# Patient Record
Sex: Male | Born: 1955
Health system: Southern US, Community
[De-identification: ages and names within clinical notes are randomized; demographics above are authoritative.]

## PROBLEM LIST (undated history)

## (undated) DIAGNOSIS — N183 Chronic kidney disease, stage 3 unspecified: Secondary | ICD-10-CM

## (undated) DIAGNOSIS — J449 Chronic obstructive pulmonary disease, unspecified: Secondary | ICD-10-CM

## (undated) DIAGNOSIS — I10 Essential (primary) hypertension: Secondary | ICD-10-CM

## (undated) DIAGNOSIS — D509 Iron deficiency anemia, unspecified: Secondary | ICD-10-CM

## (undated) DIAGNOSIS — R339 Retention of urine, unspecified: Secondary | ICD-10-CM

## (undated) DIAGNOSIS — C760 Malignant neoplasm of head, face and neck: Secondary | ICD-10-CM

## (undated) DIAGNOSIS — I739 Peripheral vascular disease, unspecified: Secondary | ICD-10-CM

## (undated) DIAGNOSIS — I779 Disorder of arteries and arterioles, unspecified: Secondary | ICD-10-CM

## (undated) DIAGNOSIS — I2510 Atherosclerotic heart disease of native coronary artery without angina pectoris: Secondary | ICD-10-CM

## (undated) DIAGNOSIS — I5032 Chronic diastolic (congestive) heart failure: Secondary | ICD-10-CM

## (undated) DIAGNOSIS — I429 Cardiomyopathy, unspecified: Secondary | ICD-10-CM

## (undated) DIAGNOSIS — I499 Cardiac arrhythmia, unspecified: Secondary | ICD-10-CM

## (undated) DIAGNOSIS — I4891 Unspecified atrial fibrillation: Secondary | ICD-10-CM

## (undated) HISTORY — DX: Essential (primary) hypertension: I10

## (undated) HISTORY — DX: Malignant neoplasm of head, face and neck: C76.0

## (undated) HISTORY — DX: Cardiomyopathy, unspecified: I42.9

## (undated) HISTORY — DX: Disorder of arteries and arterioles, unspecified: I77.9

## (undated) HISTORY — DX: Chronic kidney disease, stage 3 (moderate): N18.3

## (undated) HISTORY — DX: Retention of urine, unspecified: R33.9

## (undated) HISTORY — DX: Chronic diastolic (congestive) heart failure: I50.32

## (undated) HISTORY — PX: TONSILLECTOMY: SUR1361

## (undated) HISTORY — DX: Peripheral vascular disease, unspecified: I73.9

## (undated) HISTORY — DX: Chronic kidney disease, stage 3 unspecified: N18.30

## (undated) HISTORY — PX: ANKLE CLOSED REDUCTION: SHX880

---

## 2011-04-23 ENCOUNTER — Emergency Department: Payer: Self-pay | Admitting: Emergency Medicine

## 2011-04-23 LAB — COMPREHENSIVE METABOLIC PANEL
Albumin: 3.2 g/dL — ABNORMAL LOW (ref 3.4–5.0)
Alkaline Phosphatase: 62 U/L (ref 50–136)
Anion Gap: 12 (ref 7–16)
BUN: 17 mg/dL (ref 7–18)
Bilirubin,Total: 1 mg/dL (ref 0.2–1.0)
Chloride: 100 mmol/L (ref 98–107)
Co2: 28 mmol/L (ref 21–32)
Creatinine: 1.18 mg/dL (ref 0.60–1.30)
EGFR (African American): >60
Glucose: 101 mg/dL — ABNORMAL HIGH (ref 65–99)
SGOT(AST): 64 U/L — ABNORMAL HIGH (ref 15–37)
SGPT (ALT): 70 U/L
Total Protein: 7.3 g/dL (ref 6.4–8.2)

## 2011-04-23 LAB — DIFFERENTIAL
Basophil #: 0 10*3/uL (ref 0.0–0.1)
Basophil %: 0.3 %
Eosinophil %: 0.1 %
Lymphocyte #: 1.1 10*3/uL (ref 1.0–3.6)
Lymphocyte %: 7.7 %
Monocyte %: 6.3 %
Neutrophil #: 12.4 10*3/uL — ABNORMAL HIGH (ref 1.4–6.5)

## 2011-04-23 LAB — URINALYSIS, COMPLETE
Bilirubin,UR: NEGATIVE
Ketone: NEGATIVE
Nitrite: NEGATIVE
Protein: 30
RBC,UR: 8 /HPF (ref 0–5)
WBC UR: 6 /HPF (ref 0–5)

## 2011-04-23 LAB — CBC
HCT: 41.6 % (ref 40.0–52.0)
HGB: 13.8 g/dL (ref 13.0–18.0)
MCH: 28 pg (ref 26.0–34.0)
MCV: 84 fL (ref 80–100)
RBC: 4.93 10*6/uL (ref 4.40–5.90)
RDW: 12.3 % (ref 11.5–14.5)

## 2017-01-31 DIAGNOSIS — C760 Malignant neoplasm of head, face and neck: Secondary | ICD-10-CM

## 2017-01-31 HISTORY — PX: BASAL CELL CARCINOMA EXCISION: SHX1214

## 2017-01-31 HISTORY — DX: Malignant neoplasm of head, face and neck: C76.0

## 2017-04-01 ENCOUNTER — Inpatient Hospital Stay (HOSPITAL_COMMUNITY)
Admission: EM | Admit: 2017-04-01 | Discharge: 2017-04-06 | DRG: 291 | Disposition: A | Discharge status: Home / Self Care | Payer: Medicaid Other | Attending: Internal Medicine | Admitting: Internal Medicine

## 2017-04-01 ENCOUNTER — Other Ambulatory Visit: Payer: Self-pay

## 2017-04-01 ENCOUNTER — Encounter (HOSPITAL_COMMUNITY): Payer: Self-pay | Admitting: *Deleted

## 2017-04-01 DIAGNOSIS — I248 Other forms of acute ischemic heart disease: Secondary | ICD-10-CM | POA: Diagnosis present

## 2017-04-01 DIAGNOSIS — I6521 Occlusion and stenosis of right carotid artery: Secondary | ICD-10-CM | POA: Diagnosis present

## 2017-04-01 DIAGNOSIS — J3489 Other specified disorders of nose and nasal sinuses: Secondary | ICD-10-CM

## 2017-04-01 DIAGNOSIS — K047 Periapical abscess without sinus: Secondary | ICD-10-CM | POA: Diagnosis present

## 2017-04-01 DIAGNOSIS — I471 Supraventricular tachycardia: Secondary | ICD-10-CM | POA: Diagnosis present

## 2017-04-01 DIAGNOSIS — E785 Hyperlipidemia, unspecified: Secondary | ICD-10-CM | POA: Diagnosis present

## 2017-04-01 DIAGNOSIS — D75839 Thrombocytosis, unspecified: Secondary | ICD-10-CM

## 2017-04-01 DIAGNOSIS — E872 Acidosis, unspecified: Secondary | ICD-10-CM

## 2017-04-01 DIAGNOSIS — I5032 Chronic diastolic (congestive) heart failure: Secondary | ICD-10-CM

## 2017-04-01 DIAGNOSIS — D473 Essential (hemorrhagic) thrombocythemia: Secondary | ICD-10-CM

## 2017-04-01 DIAGNOSIS — Z720 Tobacco use: Secondary | ICD-10-CM

## 2017-04-01 DIAGNOSIS — C44319 Basal cell carcinoma of skin of other parts of face: Secondary | ICD-10-CM | POA: Diagnosis present

## 2017-04-01 DIAGNOSIS — I509 Heart failure, unspecified: Secondary | ICD-10-CM

## 2017-04-01 DIAGNOSIS — J349 Unspecified disorder of nose and nasal sinuses: Secondary | ICD-10-CM | POA: Diagnosis present

## 2017-04-01 DIAGNOSIS — K029 Dental caries, unspecified: Secondary | ICD-10-CM | POA: Diagnosis present

## 2017-04-01 DIAGNOSIS — D638 Anemia in other chronic diseases classified elsewhere: Secondary | ICD-10-CM

## 2017-04-01 DIAGNOSIS — F17200 Nicotine dependence, unspecified, uncomplicated: Secondary | ICD-10-CM

## 2017-04-01 DIAGNOSIS — I6529 Occlusion and stenosis of unspecified carotid artery: Secondary | ICD-10-CM

## 2017-04-01 DIAGNOSIS — R918 Other nonspecific abnormal finding of lung field: Secondary | ICD-10-CM | POA: Diagnosis present

## 2017-04-01 DIAGNOSIS — T501X5A Adverse effect of loop [high-ceiling] diuretics, initial encounter: Secondary | ICD-10-CM | POA: Diagnosis present

## 2017-04-01 DIAGNOSIS — I5022 Chronic systolic (congestive) heart failure: Secondary | ICD-10-CM

## 2017-04-01 DIAGNOSIS — L989 Disorder of the skin and subcutaneous tissue, unspecified: Secondary | ICD-10-CM

## 2017-04-01 DIAGNOSIS — R739 Hyperglycemia, unspecified: Secondary | ICD-10-CM | POA: Diagnosis present

## 2017-04-01 DIAGNOSIS — I96 Gangrene, not elsewhere classified: Secondary | ICD-10-CM | POA: Diagnosis present

## 2017-04-01 DIAGNOSIS — R778 Other specified abnormalities of plasma proteins: Secondary | ICD-10-CM

## 2017-04-01 DIAGNOSIS — I130 Hypertensive heart and chronic kidney disease with heart failure and stage 1 through stage 4 chronic kidney disease, or unspecified chronic kidney disease: Principal | ICD-10-CM | POA: Diagnosis present

## 2017-04-01 DIAGNOSIS — D649 Anemia, unspecified: Secondary | ICD-10-CM

## 2017-04-01 DIAGNOSIS — I10 Essential (primary) hypertension: Secondary | ICD-10-CM

## 2017-04-01 DIAGNOSIS — R7989 Other specified abnormal findings of blood chemistry: Secondary | ICD-10-CM

## 2017-04-01 DIAGNOSIS — D509 Iron deficiency anemia, unspecified: Secondary | ICD-10-CM | POA: Diagnosis present

## 2017-04-01 DIAGNOSIS — J9601 Acute respiratory failure with hypoxia: Secondary | ICD-10-CM | POA: Diagnosis present

## 2017-04-01 DIAGNOSIS — N179 Acute kidney failure, unspecified: Secondary | ICD-10-CM | POA: Diagnosis present

## 2017-04-01 DIAGNOSIS — I5031 Acute diastolic (congestive) heart failure: Secondary | ICD-10-CM | POA: Diagnosis present

## 2017-04-01 DIAGNOSIS — Z823 Family history of stroke: Secondary | ICD-10-CM

## 2017-04-01 DIAGNOSIS — N182 Chronic kidney disease, stage 2 (mild): Secondary | ICD-10-CM | POA: Diagnosis present

## 2017-04-01 DIAGNOSIS — F17210 Nicotine dependence, cigarettes, uncomplicated: Secondary | ICD-10-CM | POA: Diagnosis present

## 2017-04-01 DIAGNOSIS — E876 Hypokalemia: Secondary | ICD-10-CM | POA: Diagnosis present

## 2017-04-01 NOTE — ED Triage Notes (Signed)
[facility] reports pt reports sudden onset of sob around 2 hrs ago.

## 2017-04-01 NOTE — ED Provider Notes (Signed)
[facility] EMERGENCY DEPARTMENT Provider Note   CSN: [id number] Arrival date & time: 04/01/17  2315     History   Chief Complaint Chief Complaint  Patient presents with  . Shortness of Breath    HPI Michael [name] is a 62 y.o. male.  Patient brought from home by EMS for shortness of breath.  He reports that symptoms began approximately 2 hours ago.  Patient denies any previous medical history including heart problems and lung problems.  First responders report that the patient was very tachypneic and his room air oxygen saturation was in the 60% range.  He was placed on oxygen with improvement.  EMS report that they found him to be very diminished with slight wheezing.  He has been given multiple nebs with some improvement, but still requiring submental oxygen to maintain his saturations.  Patient denies any chest pain.  He denies any fevers, recent upper respiratory infection symptoms such as cough and congestion.      History reviewed. No pertinent past medical history.  There are no active problems to display for this patient.   History reviewed. No pertinent surgical history.     Home Medications    Prior to Admission medications   Not on File    Family History No family history on file.  Social History Social History   Tobacco Use  . Smoking status: Current Some Day Smoker    Packs/day: 0.50  . Smokeless tobacco: Never Used  Substance Use Topics  . Alcohol use: Not on file  . Drug use: Not on file     Allergies   Patient has no known allergies.   Review of Systems Review of Systems  Respiratory: Positive for shortness of breath.   All other systems reviewed and are negative.    Physical Exam Updated Vital Signs BP (!) 149/106   Pulse 97   Temp 98.3 F (36.8 C) (Oral)   Resp 19   SpO2 91%   Physical Exam  Constitutional: He is oriented to person, place, and time. He appears well-developed and well-nourished. No distress.  HENT:  Head:  Normocephalic and atraumatic.  Right Ear: Hearing normal.  Left Ear: Hearing normal.  Nose: Nose normal.  Mouth/Throat: Oropharynx is clear and moist and mucous membranes are normal.  Deeply ulcerated area at right nasolabial fold and right nare  Eyes: Conjunctivae and EOM are normal. Pupils are equal, round, and reactive to light.  Neck: Normal range of motion. Neck supple.  Cardiovascular: Regular rhythm, S1 normal and S2 normal. Exam reveals no gallop and no friction rub.  No murmur heard. Pulmonary/Chest: Effort normal. Tachypnea noted. He has decreased breath sounds. He has rhonchi. He has rales. He exhibits no tenderness.  Abdominal: Soft. Normal appearance and bowel sounds are normal. There is no hepatosplenomegaly. There is no tenderness. There is no rebound, no guarding, no tenderness at McBurney's point and negative Murphy's sign. No hernia.  Musculoskeletal: Normal range of motion.  Neurological: He is alert and oriented to person, place, and time. He has normal strength. No cranial nerve deficit or sensory deficit. Coordination normal. GCS eye subscore is 4. GCS verbal subscore is 5. GCS motor subscore is 6.  Skin: Skin is warm, dry and intact. No rash noted. No cyanosis.  Psychiatric: He has a normal mood and affect. His speech is normal and behavior is normal. Thought content normal.  Nursing note and vitals reviewed.    ED Treatments / Results  Labs (all labs ordered are  listed, but only abnormal results are displayed) Labs Reviewed  CBC WITH DIFFERENTIAL/PLATELET - Abnormal; Notable for the following components:      Result Value   WBC 17.4 (*)    Hemoglobin 9.1 (*)    HCT 31.1 (*)    MCV 71.8 (*)    MCH 21.0 (*)    MCHC 29.3 (*)    RDW 16.8 (*)    Neutro Abs 15.0 (*)    All other components within normal limits  COMPREHENSIVE METABOLIC PANEL - Abnormal; Notable for the following components:   Potassium 3.4 (*)    Glucose, Bld 154 (*)    BUN 21 (*)     Creatinine, Ser 1.35 (*)    Calcium 8.6 (*)    Albumin 3.3 (*)    ALT 15 (*)    GFR calc non Af Amer 55 (*)    All other components within normal limits  BRAIN NATRIURETIC PEPTIDE - Abnormal; Notable for the following components:   B Natriuretic Peptide 812.0 (*)    All other components within normal limits  D-DIMER, QUANTITATIVE (NOT AT [facility]) - Abnormal; Notable for the following components:   D-Dimer, Quant 5.20 (*)    All other components within normal limits  I-STAT CG4 LACTIC ACID, ED - Abnormal; Notable for the following components:   Lactic Acid, Venous 3.21 (*)    All other components within normal limits  I-STAT TROPONIN, ED - Abnormal; Notable for the following components:   Troponin i, poc 0.12 (*)    All other components within normal limits  PROTIME-INR  BLOOD GAS, ARTERIAL  URINALYSIS, ROUTINE W REFLEX MICROSCOPIC    EKG  EKG Interpretation  Date/Time:  Sunday April 02 2017 00:05:34 EST Ventricular Rate:  104 PR Interval:    QRS Duration: 118 QT Interval:  412 QTC Calculation: 542 R Axis:   62 Text Interpretation:  Ectopic atrial tachycardia, unifocal or sinus tachycardia Multiple ventricular premature complexes Nonspecific intraventricular conduction delay Anteroseptal infarct, old Nonspecific repol abnormality, lateral leads Confirmed by Orpah Greek [phone]) on 04/02/2017 12:09:18 AM       Radiology Dg Chest 2 View  Result Date: 04/02/2017 CLINICAL DATA:  Acute onset shortness of breath 2 hours ago. EXAM: CHEST  2 VIEW COMPARISON:  None. FINDINGS: The chest is hyperexpanded with distortion of the pulmonary architecture. Heart size is upper normal. No consolidative process, edema, pneumothorax or effusion. Aortic atherosclerosis is noted. No acute bony abnormality. IMPRESSION: No acute disease. Appearance of the chest compatible with COPD. Atherosclerosis. Electronically Signed   By: Inge Rise M.D.   On: 04/02/2017 00:31   Ct Angio Chest Pe W Or  Wo Contrast  Result Date: 04/02/2017 CLINICAL DATA:  Shortness of breath.  Elevated D-dimer.  Smoker. EXAM: CT ANGIOGRAPHY CHEST WITH CONTRAST TECHNIQUE: Multidetector CT imaging of the chest was performed using the standard protocol during bolus administration of intravenous contrast. Multiplanar CT image reconstructions and MIPs were obtained to evaluate the vascular anatomy. CONTRAST:  1105mL ISOVUE-370 IOPAMIDOL (ISOVUE-370) INJECTION 76% COMPARISON:  Radiographs earlier this day. FINDINGS: Cardiovascular: There are no filling defects within the pulmonary arteries to suggest pulmonary embolus. Moderate calcified and noncalcified atheromatous plaque of the thoracic aorta without dissection, aneurysm, or acute aortic abnormality. Atheromatous plaque involves the origin of the great vessels from the aortic arch with mild luminal narrowing of the left common carotid artery origin. Mild cardiomegaly. There are coronary artery calcifications. Small pericardial effusion. Mediastinum/Nodes: Prominent paratracheal and prevascular nodes, largest measuring  11 mm short axis. Prominent left greater than right hilar nodes, all subcentimeter in short axis. Esophagus is nondistended. No visualized thyroid nodule. Lungs/Pleura: Small bilateral pleural effusions. Smooth septal thickening in the lower lobes. Patchy mild perihilar ground-glass opacities, slight lower lobe predominance. There is a central bronchial thickening. Mild fissural thickening, greater on the left. Small right upper lobe calcified nodule image 27 series 6. There is a 4 mm noncalcified right upper lobe nodule image 37. 4 mm subpleural left upper lobe nodule image 50. Mild dependent atelectasis in both lower lobes. Upper Abdomen: 10 mm cyst in the liver. Adjacent small hypodensity is too small to characterize. Atherosclerosis of upper abdominal vasculature. Musculoskeletal: Vertebral body hemangioma within T2 with minimal extension into the right pedicle.  Remote right posterior eleventh rib fracture. Review of the MIP images confirms the above findings. IMPRESSION: 1. No pulmonary embolus. 2. Cardiomegaly with small pleural effusions and mild pulmonary edema, suggesting mild CHF. 3. Mild central bronchial thickening is nonspecific and may be bronchial inflammation or secondary to pulmonary edema. 4. Small right and left upper lobe pulmonary nodules, each measuring 4 mm. No follow-up needed if patient is low-risk (and has no known or suspected primary neoplasm). Non-contrast chest CT can be considered in 12 months if patient is high-risk. This recommendation follows the consensus statement: Guidelines for Management of Incidental Pulmonary Nodules Detected on CT Images: From the Fleischner Society 2017; Radiology 2017; 284:228-243. 5. Moderate Aortic Atherosclerosis (ICD10-I70.0). Coronary artery calcifications. Electronically Signed   By: Jeb Levering M.D.   On: 04/02/2017 02:13    Procedures Procedures (including critical care time)  Medications Ordered in ED Medications  furosemide (LASIX) injection 40 mg (not administered)  iopamidol (ISOVUE-370) 76 % injection 100 mL (100 mLs Intravenous Contrast Given 04/02/17 0149)     Initial Impression / Assessment and Plan / ED Course  I have reviewed the triage vital signs and the nursing notes.  Pertinent labs & imaging results that were available during my care of the patient were reviewed by me and considered in my medical decision making (see chart for details).     Patient presents to the emergency department for evaluation of shortness of breath.  Symptoms began several hours ago.  Patient is in mild respiratory distress on arrival, requiring supplemental oxygen.  He reports no previous history.  EMS report that he had some wheezing during transport, administered DuoNeb.  Arrival to the ER no wheezing is noted.  He is tachypneic with reasonably good air movement but some rales and possible  rhonchi noted.  Patient does have elevated BNP.  His d-dimer was markedly elevated as well.  He underwent CT angiography to evaluate for potential pulmonary embolism.  There is no evidence of pulmonary embolism, he does have findings consistent with congestive heart failure.  Patient's troponin is slightly elevated, likely a troponin leak secondary to acute CHF.  He is not experiencing any chest pain.  Administered IV Lasix.  He is maintaining O2 sats currently on nasal cannula O2.  He will require hospitalization for further management and workup.  Final Clinical Impressions(s) / ED Diagnoses   Final diagnoses:  Acute congestive heart failure, unspecified heart failure type [facility])    ED Discharge Orders    None       Orpah Greek, MD 04/02/17 [phone]
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
## 2017-04-02 ENCOUNTER — Observation Stay (HOSPITAL_COMMUNITY): Payer: Medicaid Other

## 2017-04-02 ENCOUNTER — Other Ambulatory Visit: Payer: Self-pay

## 2017-04-02 ENCOUNTER — Emergency Department (HOSPITAL_COMMUNITY): Payer: Medicaid Other

## 2017-04-02 ENCOUNTER — Encounter (HOSPITAL_COMMUNITY): Payer: Self-pay

## 2017-04-02 ENCOUNTER — Observation Stay (HOSPITAL_BASED_OUTPATIENT_CLINIC_OR_DEPARTMENT_OTHER): Payer: Medicaid Other

## 2017-04-02 DIAGNOSIS — L989 Disorder of the skin and subcutaneous tissue, unspecified: Secondary | ICD-10-CM

## 2017-04-02 DIAGNOSIS — I5032 Chronic diastolic (congestive) heart failure: Secondary | ICD-10-CM

## 2017-04-02 DIAGNOSIS — R778 Other specified abnormalities of plasma proteins: Secondary | ICD-10-CM

## 2017-04-02 DIAGNOSIS — D75839 Thrombocytosis, unspecified: Secondary | ICD-10-CM

## 2017-04-02 DIAGNOSIS — R748 Abnormal levels of other serum enzymes: Secondary | ICD-10-CM | POA: Diagnosis not present

## 2017-04-02 DIAGNOSIS — I340 Nonrheumatic mitral (valve) insufficiency: Secondary | ICD-10-CM

## 2017-04-02 DIAGNOSIS — D473 Essential (hemorrhagic) thrombocythemia: Secondary | ICD-10-CM

## 2017-04-02 DIAGNOSIS — I5030 Unspecified diastolic (congestive) heart failure: Secondary | ICD-10-CM

## 2017-04-02 DIAGNOSIS — E876 Hypokalemia: Secondary | ICD-10-CM

## 2017-04-02 DIAGNOSIS — I509 Heart failure, unspecified: Secondary | ICD-10-CM

## 2017-04-02 DIAGNOSIS — D638 Anemia in other chronic diseases classified elsewhere: Secondary | ICD-10-CM

## 2017-04-02 DIAGNOSIS — I5022 Chronic systolic (congestive) heart failure: Secondary | ICD-10-CM

## 2017-04-02 DIAGNOSIS — D649 Anemia, unspecified: Secondary | ICD-10-CM

## 2017-04-02 DIAGNOSIS — Z720 Tobacco use: Secondary | ICD-10-CM

## 2017-04-02 DIAGNOSIS — E872 Acidosis, unspecified: Secondary | ICD-10-CM

## 2017-04-02 DIAGNOSIS — J3489 Other specified disorders of nose and nasal sinuses: Secondary | ICD-10-CM

## 2017-04-02 DIAGNOSIS — I10 Essential (primary) hypertension: Secondary | ICD-10-CM | POA: Diagnosis not present

## 2017-04-02 DIAGNOSIS — F17200 Nicotine dependence, unspecified, uncomplicated: Secondary | ICD-10-CM

## 2017-04-02 DIAGNOSIS — R7989 Other specified abnormal findings of blood chemistry: Secondary | ICD-10-CM

## 2017-04-02 DIAGNOSIS — J9601 Acute respiratory failure with hypoxia: Secondary | ICD-10-CM

## 2017-04-02 LAB — CBC WITH DIFFERENTIAL/PLATELET
BASOS ABS: 0 10*3/uL (ref 0.0–0.1)
BASOS PCT: 0 %
EOS ABS: 0.1 10*3/uL (ref 0.0–0.7)
EOS PCT: 1 %
HCT: 31.1 % — ABNORMAL LOW (ref 39.0–52.0)
Hemoglobin: 9.1 g/dL — ABNORMAL LOW (ref 13.0–17.0)
Lymphocytes Relative: 8 %
Lymphs Abs: 1.4 10*3/uL (ref 0.7–4.0)
MCH: 21 pg — ABNORMAL LOW (ref 26.0–34.0)
MCHC: 29.3 g/dL — ABNORMAL LOW (ref 30.0–36.0)
MCV: 71.8 fL — AB (ref 78.0–100.0)
MONO ABS: 0.8 10*3/uL (ref 0.1–1.0)
Monocytes Relative: 5 %
Neutro Abs: 15 10*3/uL — ABNORMAL HIGH (ref 1.7–7.7)
Neutrophils Relative %: 86 %
PLATELETS: 360 10*3/uL (ref 150–400)
RBC: 4.33 MIL/uL (ref 4.22–5.81)
RDW: 16.8 % — AB (ref 11.5–15.5)
WBC: 17.4 10*3/uL — AB (ref 4.0–10.5)

## 2017-04-02 LAB — URINALYSIS, COMPLETE (UACMP) WITH MICROSCOPIC
Bilirubin Urine: NEGATIVE
Glucose, UA: NEGATIVE mg/dL
Ketones, ur: NEGATIVE mg/dL
Nitrite: POSITIVE — AB
PROTEIN: NEGATIVE mg/dL
SPECIFIC GRAVITY, URINE: 1.012 (ref 1.005–1.030)
pH: 8 (ref 5.0–8.0)

## 2017-04-02 LAB — BASIC METABOLIC PANEL
Anion gap: 15 (ref 5–15)
BUN: 21 mg/dL — AB (ref 6–20)
CO2: 22 mmol/L (ref 22–32)
Calcium: 9 mg/dL (ref 8.9–10.3)
Chloride: 103 mmol/L (ref 101–111)
Creatinine, Ser: 1.43 mg/dL — ABNORMAL HIGH (ref 0.61–1.24)
GFR calc Af Amer: 59 mL/min — ABNORMAL LOW (ref >60)
GFR, EST NON AFRICAN AMERICAN: 51 mL/min — AB (ref >60)
GLUCOSE: 200 mg/dL — AB (ref 65–99)
Potassium: 3.3 mmol/L — ABNORMAL LOW (ref 3.5–5.1)
SODIUM: 140 mmol/L (ref 135–145)

## 2017-04-02 LAB — LIPID PANEL
CHOL/HDL RATIO: 4.6 ratio
Cholesterol: 194 mg/dL (ref 0–200)
HDL: 42 mg/dL (ref >40)
LDL CALC: 142 mg/dL — AB (ref 0–99)
TRIGLYCERIDES: 49 mg/dL (ref <150)
VLDL: 10 mg/dL (ref 0–40)

## 2017-04-02 LAB — RETICULOCYTES
RBC.: 4.49 MIL/uL (ref 4.22–5.81)
RETIC COUNT ABSOLUTE: 44.9 10*3/uL (ref 19.0–186.0)
RETIC CT PCT: 1 % (ref 0.4–3.1)

## 2017-04-02 LAB — BLOOD GAS, ARTERIAL
Acid-Base Excess: 0.5 mmol/L (ref 0.0–2.0)
Bicarbonate: 24.2 mmol/L (ref 20.0–28.0)
Drawn by: 22223
O2 CONTENT: 6 L/min
O2 Saturation: 97.3 %
pCO2 arterial: 35.1 mmHg (ref 32.0–48.0)
pH, Arterial: 7.436 (ref 7.350–7.450)
pO2, Arterial: 101 mmHg (ref 83.0–108.0)

## 2017-04-02 LAB — COMPREHENSIVE METABOLIC PANEL
ALBUMIN: 3.3 g/dL — AB (ref 3.5–5.0)
ALT: 15 U/L — AB (ref 17–63)
AST: 22 U/L (ref 15–41)
Alkaline Phosphatase: 93 U/L (ref 38–126)
Anion gap: 12 (ref 5–15)
BUN: 21 mg/dL — ABNORMAL HIGH (ref 6–20)
CALCIUM: 8.6 mg/dL — AB (ref 8.9–10.3)
CO2: 24 mmol/L (ref 22–32)
CREATININE: 1.35 mg/dL — AB (ref 0.61–1.24)
Chloride: 107 mmol/L (ref 101–111)
GFR calc Af Amer: >60 mL/min (ref >60)
GFR, EST NON AFRICAN AMERICAN: 55 mL/min — AB (ref >60)
GLUCOSE: 154 mg/dL — AB (ref 65–99)
POTASSIUM: 3.4 mmol/L — AB (ref 3.5–5.1)
SODIUM: 143 mmol/L (ref 135–145)
TOTAL PROTEIN: 7.2 g/dL (ref 6.5–8.1)
Total Bilirubin: 0.4 mg/dL (ref 0.3–1.2)

## 2017-04-02 LAB — VITAMIN B12: Vitamin B-12: 163 pg/mL — ABNORMAL LOW (ref 180–914)

## 2017-04-02 LAB — PROTIME-INR
INR: 1.08
Prothrombin Time: 13.9 seconds (ref 11.4–15.2)

## 2017-04-02 LAB — ECHOCARDIOGRAM COMPLETE
HEIGHTINCHES: 72 in
Weight: 3072 oz

## 2017-04-02 LAB — CBC
HCT: 31.8 % — ABNORMAL LOW (ref 39.0–52.0)
HEMOGLOBIN: 9.2 g/dL — AB (ref 13.0–17.0)
MCH: 20.5 pg — AB (ref 26.0–34.0)
MCHC: 28.9 g/dL — ABNORMAL LOW (ref 30.0–36.0)
MCV: 70.8 fL — ABNORMAL LOW (ref 78.0–100.0)
PLATELETS: 417 10*3/uL — AB (ref 150–400)
RBC: 4.49 MIL/uL (ref 4.22–5.81)
RDW: 16.8 % — ABNORMAL HIGH (ref 11.5–15.5)
WBC: 10.8 10*3/uL — AB (ref 4.0–10.5)

## 2017-04-02 LAB — URINALYSIS, ROUTINE W REFLEX MICROSCOPIC
Bacteria, UA: NONE SEEN
Bilirubin Urine: NEGATIVE
Glucose, UA: NEGATIVE mg/dL
Ketones, ur: NEGATIVE mg/dL
Nitrite: POSITIVE — AB
PH: 8 (ref 5.0–8.0)
Protein, ur: 100 mg/dL — AB
SPECIFIC GRAVITY, URINE: 1.019 (ref 1.005–1.030)

## 2017-04-02 LAB — TROPONIN I
TROPONIN I: 0.85 ng/mL — AB (ref <0.03)
Troponin I: 0.83 ng/mL (ref <0.03)
Troponin I: 0.78 ng/mL (ref <0.03)

## 2017-04-02 LAB — GLUCOSE, CAPILLARY
GLUCOSE-CAPILLARY: 191 mg/dL — AB (ref 65–99)
GLUCOSE-CAPILLARY: 124 mg/dL — AB (ref 65–99)
GLUCOSE-CAPILLARY: 135 mg/dL — AB (ref 65–99)
Glucose-Capillary: 118 mg/dL — ABNORMAL HIGH (ref 65–99)

## 2017-04-02 LAB — I-STAT CG4 LACTIC ACID, ED: Lactic Acid, Venous: 3.21 mmol/L (ref 0.5–1.9)

## 2017-04-02 LAB — IRON AND TIBC
IRON: 17 ug/dL — AB (ref 45–182)
Saturation Ratios: 3 % — ABNORMAL LOW (ref 17.9–39.5)
TIBC: 540 ug/dL — ABNORMAL HIGH (ref 250–450)
UIBC: 523 ug/dL

## 2017-04-02 LAB — I-STAT TROPONIN, ED: TROPONIN I, POC: 0.12 ng/mL — AB (ref 0.00–0.08)

## 2017-04-02 LAB — PROCALCITONIN: Procalcitonin: 1.15 ng/mL

## 2017-04-02 LAB — D-DIMER, QUANTITATIVE (NOT AT ARMC): D DIMER QUANT: 5.2 ug{FEU}/mL — AB (ref 0.00–0.50)

## 2017-04-02 LAB — TSH: TSH: 0.537 u[IU]/mL (ref 0.350–4.500)

## 2017-04-02 LAB — LACTIC ACID, PLASMA: Lactic Acid, Venous: 4.8 mmol/L (ref 0.5–1.9)

## 2017-04-02 LAB — HEMOGLOBIN A1C
HEMOGLOBIN A1C: 5.3 % (ref 4.8–5.6)
Mean Plasma Glucose: 105.41 mg/dL

## 2017-04-02 LAB — BRAIN NATRIURETIC PEPTIDE: B Natriuretic Peptide: 812 pg/mL — ABNORMAL HIGH (ref 0.0–100.0)

## 2017-04-02 LAB — MAGNESIUM: MAGNESIUM: 1.9 mg/dL (ref 1.7–2.4)

## 2017-04-02 MED ORDER — CARVEDILOL 3.125 MG PO TABS
6.2500 mg | ORAL_TABLET | Freq: Two times a day (BID) | ORAL | Status: DC
  Administered 2017-04-02 – 2017-04-03 (×2): 6.25 mg via ORAL
  Filled 2017-04-02 (×2): qty 2

## 2017-04-02 MED ORDER — FUROSEMIDE 40 MG PO TABS
40.0000 mg | ORAL_TABLET | Freq: Every day | ORAL | Status: DC
  Administered 2017-04-03 – 2017-04-06 (×4): 40 mg via ORAL
  Filled 2017-04-02 (×4): qty 1

## 2017-04-02 MED ORDER — ONDANSETRON HCL 4 MG/2ML IJ SOLN: 4.0000 mg | Freq: Four times a day (QID) | INTRAMUSCULAR | Status: DC | PRN

## 2017-04-02 MED ORDER — HYDRALAZINE HCL 20 MG/ML IJ SOLN: 10.0000 mg | INTRAMUSCULAR | Status: DC | PRN

## 2017-04-02 MED ORDER — LIDOCAINE HCL (PF) 1 % IJ SOLN
10.0000 mL | Freq: Once | INTRAMUSCULAR | Status: AC
  Administered 2017-04-02: 10 mL via INTRADERMAL
  Filled 2017-04-02: qty 10

## 2017-04-02 MED ORDER — HEPARIN SODIUM (PORCINE) 5000 UNIT/ML IJ SOLN
5000.0000 [IU] | Freq: Three times a day (TID) | INTRAMUSCULAR | Status: DC
  Administered 2017-04-02 – 2017-04-04 (×4): 5000 [IU] via SUBCUTANEOUS
  Filled 2017-04-02 (×6): qty 1

## 2017-04-02 MED ORDER — SODIUM CHLORIDE 0.9% FLUSH: 3.0000 mL | INTRAVENOUS | Status: DC | PRN

## 2017-04-02 MED ORDER — SODIUM CHLORIDE 0.9% FLUSH
3.0000 mL | Freq: Two times a day (BID) | INTRAVENOUS | Status: DC
  Administered 2017-04-02 – 2017-04-06 (×8): 3 mL via INTRAVENOUS

## 2017-04-02 MED ORDER — ACETAMINOPHEN 650 MG RE SUPP: 650.0000 mg | Freq: Four times a day (QID) | RECTAL | Status: DC | PRN

## 2017-04-02 MED ORDER — SODIUM CHLORIDE 0.9 % IV SOLN: 250.0000 mL | INTRAVENOUS | Status: DC | PRN

## 2017-04-02 MED ORDER — SILVER NITRATE-POT NITRATE 75-25 % EX MISC
1.0000 "application " | Freq: Once | CUTANEOUS | Status: AC
  Administered 2017-04-02: 1 via TOPICAL
  Filled 2017-04-02: qty 1

## 2017-04-02 MED ORDER — FUROSEMIDE 40 MG PO TABS: 40.0000 mg | ORAL_TABLET | Freq: Every day | ORAL | Status: DC

## 2017-04-02 MED ORDER — POTASSIUM CHLORIDE CRYS ER 20 MEQ PO TBCR
40.0000 meq | EXTENDED_RELEASE_TABLET | Freq: Once | ORAL | Status: AC
  Administered 2017-04-02: 40 meq via ORAL
  Filled 2017-04-02: qty 2

## 2017-04-02 MED ORDER — INSULIN ASPART 100 UNIT/ML ~~LOC~~ SOLN
0.0000 [IU] | Freq: Three times a day (TID) | SUBCUTANEOUS | Status: DC
  Administered 2017-04-02: 2 [IU] via SUBCUTANEOUS
  Administered 2017-04-02: 1 [IU] via SUBCUTANEOUS

## 2017-04-02 MED ORDER — ONDANSETRON HCL 4 MG PO TABS: 4.0000 mg | ORAL_TABLET | Freq: Four times a day (QID) | ORAL | Status: DC | PRN

## 2017-04-02 MED ORDER — FUROSEMIDE 10 MG/ML IJ SOLN
40.0000 mg | Freq: Two times a day (BID) | INTRAMUSCULAR | Status: DC
  Administered 2017-04-02: 40 mg via INTRAVENOUS
  Filled 2017-04-02: qty 4

## 2017-04-02 MED ORDER — ACETAMINOPHEN 325 MG PO TABS
650.0000 mg | ORAL_TABLET | Freq: Four times a day (QID) | ORAL | Status: DC | PRN
  Administered 2017-04-05: 650 mg via ORAL
  Filled 2017-04-02 (×2): qty 2

## 2017-04-02 MED ORDER — INSULIN ASPART 100 UNIT/ML ~~LOC~~ SOLN: 0.0000 [IU] | Freq: Every day | SUBCUTANEOUS | Status: DC

## 2017-04-02 MED ORDER — IPRATROPIUM-ALBUTEROL 0.5-2.5 (3) MG/3ML IN SOLN
3.0000 mL | Freq: Four times a day (QID) | RESPIRATORY_TRACT | Status: DC
  Administered 2017-04-02 – 2017-04-03 (×4): 3 mL via RESPIRATORY_TRACT
  Filled 2017-04-02 (×4): qty 3

## 2017-04-02 MED ORDER — IOPAMIDOL (ISOVUE-370) INJECTION 76%
100.0000 mL | Freq: Once | INTRAVENOUS | Status: AC | PRN
  Administered 2017-04-02: 100 mL via INTRAVENOUS

## 2017-04-02 MED ORDER — NICOTINE 7 MG/24HR TD PT24
7.0000 mg | MEDICATED_PATCH | Freq: Every day | TRANSDERMAL | Status: DC
  Administered 2017-04-02 – 2017-04-06 (×5): 7 mg via TRANSDERMAL
  Filled 2017-04-02 (×7): qty 1

## 2017-04-02 MED ORDER — FUROSEMIDE 10 MG/ML IJ SOLN
40.0000 mg | Freq: Once | INTRAMUSCULAR | Status: AC
  Administered 2017-04-02: 40 mg via INTRAVENOUS
  Filled 2017-04-02: qty 4
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
## 2017-04-02 NOTE — Progress Notes (Signed)
*  PRELIMINARY RESULTS* Echocardiogram 2D Echocardiogram has been performed.  [name] 04/02/2017, 9:27 AM

## 2017-04-02 NOTE — ED Notes (Signed)
Date and time results received: 04/02/17 0031 (use smartphrase ".now" to insert current time)  Test: Lactic Critical Value: 3.21  Name of Provider Notified: Pollina  Orders Received? Or Actions Taken?:

## 2017-04-02 NOTE — Progress Notes (Signed)
Lab called with critical value of Troponin 0.85, MD, Dr. Carles Collet paged through [facility].
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
## 2017-04-02 NOTE — ED Notes (Signed)
Date and time results received: 04/02/17 0031 (use smartphrase ".now" to insert current time)  Test: troponin  Critical Value: 0.12    Name of Provider Notified: Pollina  Orders Received? Or Actions Taken?:

## 2017-04-02 NOTE — Procedures (Signed)
[facility] Procedure Note  04/02/17  Preoperative Diagnosis: Left cheek fungating mass; Right ulcerated lesion over nostril    Postoperative Diagnosis: Same   Procedure(s) Performed: Incisional biopsy of the left cheek fungating mass    Surgeon: Lanell Matar. Constance Haw, MD   Assistants: No qualified resident was available   Anesthesia: 1% lidocaine    Specimens: Left cheek lesion; sent to pathology in [place] (spoke with Dr.Lang, pathologist on call and verified how to send)    Estimated Blood Loss: Minimal   Wound Class:Clean    Procedure Indications: Michael [name] is a 62 yo who does not see healthcare providers regularly and has left cheek fungating lesion as well as a right nostril ulcerated lesion that has encompassed the whole right nostril.  Due to these findings, there is concern for primary skin cancers versus metastatic disease. He is currently having a CHF exacerbation and elevated troponins, and after a discussion of the risk and benefits of incisional biopsy to see what is going on with the left cheek lesion including but not limited to bleeding, infection, no, he opted to proceed. As far as the ulcerated necrotic area encompassing the prior location of the right nostril, this will need to be further delineated with CT and ENT/ plastic evaluation.  If the left cheek lesion needs to be removed, it is likely that plastic surgery will need to do this due to the location and potential need for flap coverage.   Findings: Fungating lesion on left cheek      Procedure: The patient was sat in his hospital bed.  The left cheek was prepped and draped in the normal sterile fashion. Lidocaine 1% was placed at the tip of this fungating lesion.  A 0.5cm portion of the lesion was excised. There was no way to close any tissue over the excised portion. Silver nitrate sticks were used to get hemostasis. A gauze dressing with paper tape was applied.   The specimen was sent in  formalin to pathology per the pathologist, Dr. Rogelia Mire request.    Final inspection revealed acceptable hemostasis  The patient tolerated the procedure without issues.    Curlene Labrum, MD [facility] [address] [place], [place] [address] [phone] (office)

## 2017-04-03 ENCOUNTER — Observation Stay (HOSPITAL_COMMUNITY): Payer: Medicaid Other

## 2017-04-03 DIAGNOSIS — J349 Unspecified disorder of nose and nasal sinuses: Secondary | ICD-10-CM | POA: Diagnosis present

## 2017-04-03 DIAGNOSIS — J340 Abscess, furuncle and carbuncle of nose: Secondary | ICD-10-CM | POA: Diagnosis not present

## 2017-04-03 DIAGNOSIS — F17210 Nicotine dependence, cigarettes, uncomplicated: Secondary | ICD-10-CM | POA: Diagnosis present

## 2017-04-03 DIAGNOSIS — R739 Hyperglycemia, unspecified: Secondary | ICD-10-CM | POA: Diagnosis present

## 2017-04-03 DIAGNOSIS — E876 Hypokalemia: Secondary | ICD-10-CM | POA: Diagnosis present

## 2017-04-03 DIAGNOSIS — N179 Acute kidney failure, unspecified: Secondary | ICD-10-CM | POA: Diagnosis not present

## 2017-04-03 DIAGNOSIS — I5031 Acute diastolic (congestive) heart failure: Secondary | ICD-10-CM | POA: Diagnosis present

## 2017-04-03 DIAGNOSIS — R918 Other nonspecific abnormal finding of lung field: Secondary | ICD-10-CM | POA: Diagnosis present

## 2017-04-03 DIAGNOSIS — J9601 Acute respiratory failure with hypoxia: Secondary | ICD-10-CM | POA: Diagnosis present

## 2017-04-03 DIAGNOSIS — J811 Chronic pulmonary edema: Secondary | ICD-10-CM | POA: Diagnosis not present

## 2017-04-03 DIAGNOSIS — K029 Dental caries, unspecified: Secondary | ICD-10-CM | POA: Diagnosis present

## 2017-04-03 DIAGNOSIS — I10 Essential (primary) hypertension: Secondary | ICD-10-CM | POA: Diagnosis not present

## 2017-04-03 DIAGNOSIS — I248 Other forms of acute ischemic heart disease: Secondary | ICD-10-CM | POA: Diagnosis present

## 2017-04-03 DIAGNOSIS — I130 Hypertensive heart and chronic kidney disease with heart failure and stage 1 through stage 4 chronic kidney disease, or unspecified chronic kidney disease: Secondary | ICD-10-CM | POA: Diagnosis not present

## 2017-04-03 DIAGNOSIS — K047 Periapical abscess without sinus: Secondary | ICD-10-CM | POA: Diagnosis present

## 2017-04-03 DIAGNOSIS — D649 Anemia, unspecified: Secondary | ICD-10-CM | POA: Diagnosis not present

## 2017-04-03 DIAGNOSIS — E785 Hyperlipidemia, unspecified: Secondary | ICD-10-CM | POA: Diagnosis present

## 2017-04-03 DIAGNOSIS — I96 Gangrene, not elsewhere classified: Secondary | ICD-10-CM | POA: Diagnosis present

## 2017-04-03 DIAGNOSIS — R748 Abnormal levels of other serum enzymes: Secondary | ICD-10-CM | POA: Diagnosis not present

## 2017-04-03 DIAGNOSIS — E872 Acidosis: Secondary | ICD-10-CM | POA: Diagnosis present

## 2017-04-03 DIAGNOSIS — J90 Pleural effusion, not elsewhere classified: Secondary | ICD-10-CM | POA: Diagnosis not present

## 2017-04-03 DIAGNOSIS — Z823 Family history of stroke: Secondary | ICD-10-CM | POA: Diagnosis not present

## 2017-04-03 DIAGNOSIS — I6521 Occlusion and stenosis of right carotid artery: Secondary | ICD-10-CM | POA: Diagnosis present

## 2017-04-03 DIAGNOSIS — C44319 Basal cell carcinoma of skin of other parts of face: Secondary | ICD-10-CM | POA: Diagnosis present

## 2017-04-03 DIAGNOSIS — I509 Heart failure, unspecified: Secondary | ICD-10-CM | POA: Diagnosis not present

## 2017-04-03 DIAGNOSIS — D509 Iron deficiency anemia, unspecified: Secondary | ICD-10-CM | POA: Diagnosis present

## 2017-04-03 DIAGNOSIS — T501X5A Adverse effect of loop [high-ceiling] diuretics, initial encounter: Secondary | ICD-10-CM | POA: Diagnosis present

## 2017-04-03 DIAGNOSIS — N182 Chronic kidney disease, stage 2 (mild): Secondary | ICD-10-CM | POA: Diagnosis present

## 2017-04-03 DIAGNOSIS — I471 Supraventricular tachycardia: Secondary | ICD-10-CM | POA: Diagnosis present

## 2017-04-03 LAB — CBC WITH DIFFERENTIAL/PLATELET
BASOS ABS: 0 10*3/uL (ref 0.0–0.1)
Basophils Relative: 0 %
EOS PCT: 0 %
Eosinophils Absolute: 0 10*3/uL (ref 0.0–0.7)
HEMATOCRIT: 27.7 % — AB (ref 39.0–52.0)
Hemoglobin: 8.1 g/dL — ABNORMAL LOW (ref 13.0–17.0)
LYMPHS ABS: 3.4 10*3/uL (ref 0.7–4.0)
LYMPHS PCT: 23 %
MCH: 20.8 pg — AB (ref 26.0–34.0)
MCHC: 29.2 g/dL — ABNORMAL LOW (ref 30.0–36.0)
MCV: 71 fL — AB (ref 78.0–100.0)
MONO ABS: 1.2 10*3/uL — AB (ref 0.1–1.0)
MONOS PCT: 8 %
NEUTROS ABS: 10.3 10*3/uL — AB (ref 1.7–7.7)
Neutrophils Relative %: 69 %
Platelets: 386 10*3/uL (ref 150–400)
RBC: 3.9 MIL/uL — ABNORMAL LOW (ref 4.22–5.81)
RDW: 17 % — AB (ref 11.5–15.5)
WBC: 14.9 10*3/uL — ABNORMAL HIGH (ref 4.0–10.5)

## 2017-04-03 LAB — GLUCOSE, CAPILLARY
GLUCOSE-CAPILLARY: 99 mg/dL (ref 65–99)
Glucose-Capillary: 119 mg/dL — ABNORMAL HIGH (ref 65–99)
Glucose-Capillary: 100 mg/dL — ABNORMAL HIGH (ref 65–99)
Glucose-Capillary: 106 mg/dL — ABNORMAL HIGH (ref 65–99)

## 2017-04-03 LAB — BASIC METABOLIC PANEL
Anion gap: 11 (ref 5–15)
BUN: 41 mg/dL — AB (ref 6–20)
CHLORIDE: 106 mmol/L (ref 101–111)
CO2: 23 mmol/L (ref 22–32)
CREATININE: 1.39 mg/dL — AB (ref 0.61–1.24)
Calcium: 8.7 mg/dL — ABNORMAL LOW (ref 8.9–10.3)
GFR calc Af Amer: >60 mL/min (ref >60)
GFR calc non Af Amer: 53 mL/min — ABNORMAL LOW (ref >60)
GLUCOSE: 99 mg/dL (ref 65–99)
POTASSIUM: 3.4 mmol/L — AB (ref 3.5–5.1)
Sodium: 140 mmol/L (ref 135–145)

## 2017-04-03 LAB — HIV ANTIBODY (ROUTINE TESTING W REFLEX): HIV SCREEN 4TH GENERATION: NONREACTIVE

## 2017-04-03 LAB — URINE CULTURE: Culture: NO GROWTH

## 2017-04-03 LAB — FERRITIN: FERRITIN: 15 ng/mL — AB (ref 24–336)

## 2017-04-03 MED ORDER — IOPAMIDOL (ISOVUE-300) INJECTION 61%
100.0000 mL | Freq: Once | INTRAVENOUS | Status: AC | PRN
  Administered 2017-04-03: 100 mL via INTRAVENOUS

## 2017-04-03 MED ORDER — ASPIRIN 81 MG PO CHEW
81.0000 mg | CHEWABLE_TABLET | Freq: Every day | ORAL | Status: DC
  Administered 2017-04-03 – 2017-04-06 (×4): 81 mg via ORAL
  Filled 2017-04-03 (×4): qty 1

## 2017-04-03 MED ORDER — CARVEDILOL 12.5 MG PO TABS
12.5000 mg | ORAL_TABLET | Freq: Two times a day (BID) | ORAL | Status: DC
  Administered 2017-04-03 – 2017-04-06 (×6): 12.5 mg via ORAL
  Filled 2017-04-03 (×6): qty 1

## 2017-04-03 MED ORDER — IPRATROPIUM-ALBUTEROL 0.5-2.5 (3) MG/3ML IN SOLN: 3.0000 mL | Freq: Four times a day (QID) | RESPIRATORY_TRACT | Status: DC | PRN

## 2017-04-03 MED ORDER — SODIUM CHLORIDE 0.9 % IV SOLN
510.0000 mg | Freq: Once | INTRAVENOUS | Status: AC
  Administered 2017-04-03: 510 mg via INTRAVENOUS
  Filled 2017-04-03: qty 17

## 2017-04-03 MED ORDER — POTASSIUM CHLORIDE CRYS ER 20 MEQ PO TBCR
20.0000 meq | EXTENDED_RELEASE_TABLET | Freq: Once | ORAL | Status: AC
  Administered 2017-04-03: 20 meq via ORAL
  Filled 2017-04-03: qty 1

## 2017-04-03 MED ORDER — FERROUS SULFATE 325 (65 FE) MG PO TABS: 325.0000 mg | ORAL_TABLET | Freq: Every day | ORAL | Status: DC
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
## 2017-04-04 ENCOUNTER — Inpatient Hospital Stay (HOSPITAL_COMMUNITY): Payer: Medicaid Other

## 2017-04-04 DIAGNOSIS — K047 Periapical abscess without sinus: Secondary | ICD-10-CM

## 2017-04-04 DIAGNOSIS — I5031 Acute diastolic (congestive) heart failure: Secondary | ICD-10-CM

## 2017-04-04 DIAGNOSIS — D509 Iron deficiency anemia, unspecified: Secondary | ICD-10-CM

## 2017-04-04 LAB — CBC
HCT: 28.9 % — ABNORMAL LOW (ref 39.0–52.0)
Hemoglobin: 8.3 g/dL — ABNORMAL LOW (ref 13.0–17.0)
MCH: 20.6 pg — AB (ref 26.0–34.0)
MCHC: 28.7 g/dL — AB (ref 30.0–36.0)
MCV: 71.7 fL — ABNORMAL LOW (ref 78.0–100.0)
PLATELETS: 368 10*3/uL (ref 150–400)
RBC: 4.03 MIL/uL — ABNORMAL LOW (ref 4.22–5.81)
RDW: 17.1 % — AB (ref 11.5–15.5)
WBC: 10 10*3/uL (ref 4.0–10.5)

## 2017-04-04 LAB — GLUCOSE, CAPILLARY
GLUCOSE-CAPILLARY: 89 mg/dL (ref 65–99)
Glucose-Capillary: 116 mg/dL — ABNORMAL HIGH (ref 65–99)

## 2017-04-04 LAB — FOLATE RBC
Folate, Hemolysate: 298.1 ng/mL
Folate, RBC: 965 ng/mL (ref >498)
Hematocrit: 30.9 % — ABNORMAL LOW (ref 37.5–51.0)

## 2017-04-04 LAB — BASIC METABOLIC PANEL
ANION GAP: 10 (ref 5–15)
BUN: 37 mg/dL — AB (ref 6–20)
CALCIUM: 8.5 mg/dL — AB (ref 8.9–10.3)
CO2: 24 mmol/L (ref 22–32)
CREATININE: 1.21 mg/dL (ref 0.61–1.24)
Chloride: 105 mmol/L (ref 101–111)
GFR calc Af Amer: >60 mL/min (ref >60)
GLUCOSE: 88 mg/dL (ref 65–99)
Potassium: 3.4 mmol/L — ABNORMAL LOW (ref 3.5–5.1)
Sodium: 139 mmol/L (ref 135–145)

## 2017-04-04 LAB — MAGNESIUM: MAGNESIUM: 2.1 mg/dL (ref 1.7–2.4)

## 2017-04-04 MED ORDER — FERROUS SULFATE 325 (65 FE) MG PO TABS
325.0000 mg | ORAL_TABLET | Freq: Two times a day (BID) | ORAL | Status: DC
  Administered 2017-04-04 – 2017-04-06 (×5): 325 mg via ORAL
  Filled 2017-04-04 (×5): qty 1

## 2017-04-04 MED ORDER — POTASSIUM CHLORIDE CRYS ER 20 MEQ PO TBCR
40.0000 meq | EXTENDED_RELEASE_TABLET | Freq: Once | ORAL | Status: AC
  Administered 2017-04-04: 40 meq via ORAL
  Filled 2017-04-04: qty 2

## 2017-04-04 MED ORDER — ATORVASTATIN CALCIUM 40 MG PO TABS
40.0000 mg | ORAL_TABLET | Freq: Every day | ORAL | Status: DC
  Administered 2017-04-04 – 2017-04-05 (×2): 40 mg via ORAL
  Filled 2017-04-04 (×2): qty 1

## 2017-04-04 MED ORDER — POTASSIUM CHLORIDE CRYS ER 20 MEQ PO TBCR
20.0000 meq | EXTENDED_RELEASE_TABLET | Freq: Every day | ORAL | Status: DC
  Administered 2017-04-05 – 2017-04-06 (×2): 20 meq via ORAL
  Filled 2017-04-04 (×2): qty 1
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
## 2017-04-04 NOTE — Progress Notes (Signed)
Report called to Judson Roch at [facility].  To go to [id number]

## 2017-04-05 ENCOUNTER — Other Ambulatory Visit: Payer: Self-pay | Admitting: Otolaryngology

## 2017-04-05 DIAGNOSIS — J90 Pleural effusion, not elsewhere classified: Secondary | ICD-10-CM

## 2017-04-05 DIAGNOSIS — R918 Other nonspecific abnormal finding of lung field: Secondary | ICD-10-CM

## 2017-04-05 DIAGNOSIS — F17210 Nicotine dependence, cigarettes, uncomplicated: Secondary | ICD-10-CM

## 2017-04-05 DIAGNOSIS — J811 Chronic pulmonary edema: Secondary | ICD-10-CM

## 2017-04-05 DIAGNOSIS — I6521 Occlusion and stenosis of right carotid artery: Secondary | ICD-10-CM

## 2017-04-05 DIAGNOSIS — D649 Anemia, unspecified: Secondary | ICD-10-CM

## 2017-04-05 DIAGNOSIS — N179 Acute kidney failure, unspecified: Secondary | ICD-10-CM

## 2017-04-05 DIAGNOSIS — J340 Abscess, furuncle and carbuncle of nose: Secondary | ICD-10-CM

## 2017-04-05 DIAGNOSIS — C44319 Basal cell carcinoma of skin of other parts of face: Secondary | ICD-10-CM

## 2017-04-05 LAB — BASIC METABOLIC PANEL
Anion gap: 9 (ref 5–15)
BUN: 23 mg/dL — AB (ref 6–20)
CALCIUM: 8.6 mg/dL — AB (ref 8.9–10.3)
CHLORIDE: 107 mmol/L (ref 101–111)
CO2: 25 mmol/L (ref 22–32)
CREATININE: 1.22 mg/dL (ref 0.61–1.24)
GFR calc Af Amer: >60 mL/min (ref >60)
Glucose, Bld: 95 mg/dL (ref 65–99)
Potassium: 3.5 mmol/L (ref 3.5–5.1)
SODIUM: 141 mmol/L (ref 135–145)

## 2017-04-05 MED ORDER — LIDOCAINE-EPINEPHRINE (PF) 1 %-1:200000 IJ SOLN
10.0000 mL | Freq: Once | INTRAMUSCULAR | Status: DC
  Filled 2017-04-05: qty 10

## 2017-04-05 NOTE — Progress Notes (Signed)
ENT at bedside in procedure lab samples to collects
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
## 2017-04-05 NOTE — Consult Note (Addendum)
Hospital Consult    Reason for Consult:  Asymptomatic carotid artery stenosis Requesting Physician:  Ree Kida MRN #:  [id number]  History of Present Illness: This is a 62 y.o. male who was admitted on 04/01/17 with sudden onset of SOB and he called 911.  The pt was tachypneic and O2 sats were around 60% on RA.  He was treated for acute CHF with mild pulmonary edema likely secondary to hypertensive cardiomyopathy.    He was found to have a significant growth on his face as well as a right nasal necrotic facial lesion, which was worked up while in the hospital.  He has had bleeding from these lesions.  They were biopsied today.  ENT was also consulted and he will f/u with her in 1-2 weeks for further management and to potentially discuss surgery.  His case will also be presented to the tumor board once path is received to discuss with radiation oncology.    He will also have dental follow up for poor dentition.   Pt underwent CT scan as part of his workup and an incidental finding of carotid artery stenosis was present.   He then underwent a carotid duplex that revealed 70-99% right carotid bifurcation stenosis.  Left shows no hemodynamically significant stenosis.  VVS was consulted.  He was started on an aspirin & statin.  The pt denies any symptoms of stroke including temporary blindness, unilateral numbness or weakness or speech difficulty.   He is an everyday smoker.  He has been started on antihypertensive medications as well.   Past Medical History:  Diagnosis Date  . CKD (chronic kidney disease)   . HTN (hypertension)     History reviewed. No pertinent surgical history.  No Known Allergies  Prior to Admission medications   Not on File    Social History   Socioeconomic History  . Marital status: Married    Spouse name: Not on file  . Number of children: Not on file  . Years of education: Not on file  . Highest education level: Not on file  Social Needs  . Financial resource  strain: Not on file  . Food insecurity - worry: Not on file  . Food insecurity - inability: Not on file  . Transportation needs - medical: Not on file  . Transportation needs - non-medical: Not on file  Occupational History  . Not on file  Tobacco Use  . Smoking status: Current Some Day Smoker    Packs/day: 0.50  . Smokeless tobacco: Never Used  Substance and Sexual Activity  . Alcohol use: Not on file  . Drug use: Not on file  . Sexual activity: Not on file  Other Topics Concern  . Not on file  Social History Narrative  . Not on file     Family History  Problem Relation Age of Onset  . Stroke Father     ROS: [x]  Positive   [ ]  Negative   [ ]  All sytems reviewed and are negative  Cardiac: []  chest pain/pressure []  palpitations [x]  SOB []  DOE  Vascular: []  pain in legs while walking []  pain in legs at rest []  pain in legs at night []  non-healing ulcers []  swelling in legs  Pulmonary: []  productive cough []  asthma/wheezing []  home O2 [x]  COPD by xray/CT  Neurologic: []  weakness in []  arms []  legs []  numbness in []  arms []  legs []  hx of CVA []  mini stroke [] difficulty speaking or slurred speech []  temporary loss of vision in  one eye  Hematologic: []  hx of cancer  Endocrine:   []  diabetes []  thyroid disease  GI []  vomiting blood []  blood in stool  GU: []  CKD/renal failure []  HD--[]  M/W/F or []  T/T/S  Psychiatric: []  anxiety []  depression  Musculoskeletal: []  arthritis []  joint pain  Integumentary: []  rashes []  ulcers [x]  facial lesions  Constitutional: []  fever []  chills   Physical Examination  Vitals:   04/05/17 1148 04/05/17 1223  BP: (!) 159/81 (!) 149/78  Pulse: (!) 7 68  Resp:  18  Temp:  97.9 F (36.6 C)  SpO2: (!) 70% 98%   Body mass index is 25.29 kg/m.  General:  WDWN in NAD Gait: Not observed HENT: WNL, normocephalic Pulmonary: normal non-labored breathing, without Rales, rhonchi,  wheezing Cardiac:  regular Abdomen:  soft, NT/ND, no masses Skin: lesion left lower cheek and right nasolabial fold Vascular Exam/Pulses:  Right Left  Radial 2+ (normal) 2+ (normal)  Femoral 2+ (normal) 2+ (normal)  Popliteal absent absent  DP absent absent  PT absent absent   Extremities: without ischemic changes, without Gangrene , without cellulitis; without open wounds;  Musculoskeletal: no muscle wasting or atrophy  Neurologic: A&O X 3;  No focal weakness or paresthesias are detected; speech is fluent/normal Psychiatric:  The pt has Normal affect.  CBC    Component Value Date/Time   WBC 10.0 04/04/2017 0440   RBC 4.03 (L) 04/04/2017 0440   HGB 8.3 (L) 04/04/2017 0440   HGB 13.8 04/23/2011 0919   HCT 28.9 (L) 04/04/2017 0440   HCT 30.9 (L) 04/02/2017 0541   PLT 368 04/04/2017 0440   PLT 208 04/23/2011 0919   MCV 71.7 (L) 04/04/2017 0440   MCV 84 04/23/2011 0919   MCH 20.6 (L) 04/04/2017 0440   MCHC 28.7 (L) 04/04/2017 0440   RDW 17.1 (H) 04/04/2017 0440   RDW 12.3 04/23/2011 0919   LYMPHSABS 3.4 04/03/2017 0604   LYMPHSABS 1.1 04/23/2011 0919   MONOABS 1.2 (H) 04/03/2017 0604   MONOABS 0.9 (H) 04/23/2011 0919   EOSABS 0.0 04/03/2017 0604   EOSABS 0.0 04/23/2011 0919   BASOSABS 0.0 04/03/2017 0604   BASOSABS 0.0 04/23/2011 0919    BMET    Component Value Date/Time   NA 141 04/05/2017 0707   NA 140 04/23/2011 0919   K 3.5 04/05/2017 0707   K 2.4 (LL) 04/23/2011 0919   CL 107 04/05/2017 0707   CL 100 04/23/2011 0919   CO2 25 04/05/2017 0707   CO2 28 04/23/2011 0919   GLUCOSE 95 04/05/2017 0707   GLUCOSE 101 (H) 04/23/2011 0919   BUN 23 (H) 04/05/2017 0707   BUN 17 04/23/2011 0919   CREATININE 1.22 04/05/2017 0707   CREATININE 1.18 04/23/2011 0919   CALCIUM 8.6 (L) 04/05/2017 0707   CALCIUM 8.4 (L) 04/23/2011 0919   GFRNONAA >60 04/05/2017 0707   GFRNONAA >60 04/23/2011 0919   GFRAA >60 04/05/2017 0707   GFRAA >60 04/23/2011 0919    COAGS: Lab Results  Component  Value Date   INR 1.08 04/02/2017     Non-Invasive Vascular Imaging:   Carotid duplex 04/04/17: IMPRESSION: Right:  Heterogeneous and calcified plaque at the right carotid bifurcation contributes to 70%-99% stenosis by established duplex criteria.  Left:  Color duplex indicates minimal heterogeneous and calcified plaque, with no hemodynamically significant stenosis by duplex criteria in the left-sided extracranial cerebrovascular circulation.  CT maxillofacial with contrast 04/03/17: IMPRESSION: CT neck:  1. Mild acute LEFT submandibular sialoadenitis without sialolith  or mass. 2. Advanced atherosclerosis with suspected hemodynamically significant stenosis RIGHT internal carotid artery origin. Recommend CTA NECK on nonemergent basis. 3. Severe LEFT C3-4 and C4-5 neural foraminal narrowing.  CT maxillofacial:  1. Necrotic RIGHT nasal labial fold mass with RIGHT nasal bone invasion. Soft tissue invasion into nasal vestibule. Contralateral LEFT nasal vestibule mass, possibly tumor. Findings most compatible with squamous cell carcinoma. 2. LEFT lower face exophytic nodule, status post reported biopsy. LEFT suboccipital sebaceous cyst. 3. Moderate parenchymal brain volume loss and old LEFT basal ganglia lacunar infarct. Recommend CT HEAD on nonemergent basis.   Statin:  Yes.   Beta Blocker:  Yes.   Aspirin:  Yes.   ACEI:  No. ARB:  No. CCB use:  No Other antiplatelets/anticoagulants:  No.    ASSESSMENT/PLAN: This is a 62 y.o. male with incidental finding of right carotid artery stenosis during workup of facial lesions.   -pt has been asymptomatic.  Dr. Oneida Alar discussed with pt that he continue his workup and treatment for his facial lesions and will f/u with Dr. Oneida Alar in the office with repeat duplex.  If at that time the carotid stenosis is >80%, we will discuss intervention.  Our office will arrange his appointment. -continue with statin/aspirin daily and  encourage smoking cessation    Leontine Locket, PA-C [facility] [phone]  History and exam details as above.  Pt with moderate to severe right ICA stenosis.  Currently this is asymptomatic.  Pt more pressing issue is his basal cell carcinoma.  He has no significant stenosis left side.  Will arrange follow up with me in 4-6 weeks with repeat duplex at that time or would consider intervention sooner if becomes symptomatic with TIA or stroke  Otherwise medical management with ASA/statin  Ruta Hinds, MD  [facility] [place]: [phone]  Pager: [phone]
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
## 2017-04-05 NOTE — Plan of Care (Signed)
  Education: Knowledge of General Education information will improve 04/05/2017 1833 - Progressing by Rolm Baptise, RN   Health Behavior/Discharge Planning: Ability to manage health-related needs will improve 04/05/2017 1833 - Progressing by Rolm Baptise, RN   Clinical Measurements: Ability to maintain clinical measurements within normal limits will improve 04/05/2017 1833 - Progressing by Rolm Baptise, RN Will remain free from infection 04/05/2017 1833 - Progressing by Rolm Baptise, RN Diagnostic test results will improve 04/05/2017 1833 - Progressing by Rolm Baptise, RN Respiratory complications will improve 04/05/2017 1833 - Progressing by Rolm Baptise, RN Cardiovascular complication will be avoided 04/05/2017 1833 - Progressing by Rolm Baptise, RN Note Waiting on vascular surgeon to see patient need follow up appt   Clinical Measurements: Will remain free from infection 04/05/2017 1833 - Progressing by Rolm Baptise, RN   Clinical Measurements: Diagnostic test results will improve 04/05/2017 1833 - Progressing by Rolm Baptise, RN   Pain Managment: General experience of comfort will improve 02/01/5724 2035 - Not Applicable by Rolm Baptise, RN   Elimination: Will not experience complications related to bowel motility 04/05/2017 1833 by Rolm Baptise, RN Note urinal

## 2017-04-06 ENCOUNTER — Telehealth: Payer: Self-pay | Admitting: Vascular Surgery

## 2017-04-06 LAB — BASIC METABOLIC PANEL
Anion gap: 8 (ref 5–15)
BUN: 23 mg/dL — ABNORMAL HIGH (ref 6–20)
CO2: 26 mmol/L (ref 22–32)
Calcium: 8.5 mg/dL — ABNORMAL LOW (ref 8.9–10.3)
Chloride: 106 mmol/L (ref 101–111)
Creatinine, Ser: 1.24 mg/dL (ref 0.61–1.24)
GFR calc Af Amer: >60 mL/min (ref >60)
GFR calc non Af Amer: >60 mL/min (ref >60)
Glucose, Bld: 94 mg/dL (ref 65–99)
Potassium: 3.2 mmol/L — ABNORMAL LOW (ref 3.5–5.1)
Sodium: 140 mmol/L (ref 135–145)

## 2017-04-06 LAB — CBC
HCT: 30 % — ABNORMAL LOW (ref 39.0–52.0)
HEMOGLOBIN: 8.8 g/dL — AB (ref 13.0–17.0)
MCH: 21 pg — AB (ref 26.0–34.0)
MCHC: 29.3 g/dL — AB (ref 30.0–36.0)
MCV: 71.6 fL — ABNORMAL LOW (ref 78.0–100.0)
PLATELETS: 366 10*3/uL (ref 150–400)
RBC: 4.19 MIL/uL — ABNORMAL LOW (ref 4.22–5.81)
RDW: 17 % — ABNORMAL HIGH (ref 11.5–15.5)
WBC: 9.1 10*3/uL (ref 4.0–10.5)

## 2017-04-06 MED ORDER — POTASSIUM CHLORIDE CRYS ER 20 MEQ PO TBCR
40.0000 meq | EXTENDED_RELEASE_TABLET | Freq: Once | ORAL | Status: AC
  Administered 2017-04-06: 40 meq via ORAL
  Filled 2017-04-06: qty 2

## 2017-04-06 MED ORDER — FUROSEMIDE 40 MG PO TABS: 40.0000 mg | ORAL_TABLET | Freq: Every day | ORAL | 0 refills | Status: DC

## 2017-04-06 MED ORDER — ATORVASTATIN CALCIUM 40 MG PO TABS: 40.0000 mg | ORAL_TABLET | Freq: Every day | ORAL | 0 refills | Status: DC

## 2017-04-06 MED ORDER — POTASSIUM CHLORIDE CRYS ER 20 MEQ PO TBCR: 20.0000 meq | EXTENDED_RELEASE_TABLET | Freq: Every day | ORAL | 0 refills | Status: DC

## 2017-04-06 MED ORDER — FERROUS SULFATE 325 (65 FE) MG PO TABS: 325.0000 mg | ORAL_TABLET | Freq: Two times a day (BID) | ORAL | 0 refills | Status: DC

## 2017-04-06 MED ORDER — CARVEDILOL 12.5 MG PO TABS: 12.5000 mg | ORAL_TABLET | Freq: Two times a day (BID) | ORAL | 0 refills | Status: DC

## 2017-04-06 MED ORDER — NICOTINE 7 MG/24HR TD PT24: 7.0000 mg | MEDICATED_PATCH | Freq: Every day | TRANSDERMAL | 0 refills | Status: DC

## 2017-04-06 MED ORDER — ASPIRIN 81 MG PO CHEW: 81.0000 mg | CHEWABLE_TABLET | Freq: Every day | ORAL | 0 refills | Status: DC

## 2017-04-06 NOTE — Discharge Instructions (Signed)
Basal Cell Carcinoma Basal cell carcinoma is the most common form of skin cancer. It begins in the basal cells, which are at the bottom of the outer skin layer (epidermis). It occurs most often on parts of the body that are frequently exposed to the sun, such as:  Parts of the head, including the scalp or face.  Ears.  Neck.  Arms or legs.  Backs of the hands.  Basal cell carcinoma can almost always be cured. It rarely spreads to other areas of the body (metastasizes). Basal cell carcinoma may come back at the same location (recur), but it can be treated again if this occurs. What are the causes? This condition is usually caused by exposure to ultraviolet (UV) light. UV light may come from the sun or from tanning beds. Other causes include:  Exposure to arsenic.  Exposure to radiation.  Exposure to toxic tars and oils.  Certain genetic conditions, such as xeroderma pigmentosum.  What increases the risk? This condition is more likely to develop in:  People who are older than 63 years of age.  People who have fair skin (light complexion).  People who have blonde or red hair.  People who have blue, green, or gray eyes.  People who have childhood freckling.  People who have had sun exposure over long periods of time, especially during childhood.  People who have had repeated sunburns.  People who have a weakened immune system.  People who have been exposed to certain chemicals, such as tar, soot, and arsenic.  People who have chronic inflammatory conditions.  People who have chronic infections.  People who use tanning beds.  What are the signs or symptoms? The main symptom of this condition is a growth or lesion on the skin. The shape and color of the growth or lesion may vary. The five main types include:  An open sore that may remain open for 3 weeks or longer. The sore may bleed or crust. This type of lesion can be an early sign of basal cell carcinoma. Basal  cell carcinoma often shows up as a sore that does not heal.  A reddish area that may crust, itch, or cause discomfort. This may occur on areas that are exposed to the sun. These patches might be easier to feel than to see.  A shiny or clear bump that is red, white, or pink. In people who have dark hair, the bump is often tan, black, or brown. These bumps can look like moles.  A pink growth with a raised border. The growth will have a crusted and indented area in the center. Small blood vessels may appear on the surface of the growth as it gets bigger.  A scarlike area that looks like shiny, stretched skin. The area may be white, yellow, or waxy. It often has irregular borders. This may be a sign of more aggressive basal cell carcinoma.  How is this diagnosed? This condition may be diagnosed with:  A physical exam.  Removal of a tissue sample to be examined under a microscope (biopsy).  How is this treated? Treatment for this condition involves removing the cancerous tissue. The method that is used for this depends on the type, size, location, and number of tumors. Possible treatments include:  Mohs surgery. In this procedure, the cancerous skin cells are removed layer by layer until all of the tumor has been removed.  Surgical removal (excision) of the tumor. This involves removing the entire tumor and a small amount of  normal skin that surrounds it.  Cryosurgery. This involves freezing the tumor with liquid nitrogen.  Plastic surgery. The tumor is removed, and healthy skin from another part of the body is used to cover the wound. This may be done for large tumors that are in areas where it is not possible to stretch the nearby skin to sew the edges of the wound together.  Radiation. This may be used for tumors on the face.  Photodynamic therapy. A chemical cream is applied to the skin, and light exposure is used to activate the chemical.  Electrodesiccation and curettage. This  involves alternately scraping and burning the tumor while using an electric current to control bleeding.  Chemical treatments, such as imiquimod cream and interferon injections. These may be used to remove superficial tumors with minimal scarring.  Follow these instructions at home:  Avoid unprotected sun exposure.  Do self-exams as told by your health care provider. Look for new spots or changes in your skin.  Keep all follow-up visits as told by your health care provider. This is important. How is this prevented?  Avoid the sun when it is the strongest. This is usually between 10:00 a.m. and 4:00 p.m.  When you are out in the sun, use a sunscreen that has a sun protection factor (SPF) of at least 59.  Apply sunscreen at least 30 minutes before exposure to the sun.  Reapply sunscreen every 2-4 hours while you are outside. Also reapply it after swimming and after excessive sweating.  Always wear hats, protective clothing, and UV-blocking sunglasses when you are outdoors.  Do not use tanning beds. Contact a health care provider if:  You notice any new spots or any changes in your skin.  You have had a basal cell carcinoma tumor removed and you notice a new growth in the same location. This information is not intended to replace advice given to you by your health care provider. Make sure you discuss any questions you have with your health care provider. Document Released: 07/24/2002 Document Revised: 06/25/2015 Document Reviewed: 05/12/2014 Elsevier Interactive Patient Education  2018 [place].   Carotid Artery Disease The carotid arteries are arteries on both sides of the neck. They carry blood to the brain. Carotid artery disease is when the arteries get smaller (narrow) or get blocked. If these arteries get smaller or get blocked, you are more likely to have a stroke or warning stroke (transient ischemic attack). Follow these instructions at home:  Take medicines as told  by your doctor. Make sure you understand all your medicine instructions. Do not stop your medicines without talking to your doctor first.  Follow your doctor's diet instructions. It is important to eat a healthy diet that includes plenty of: ? Fresh fruits. ? Vegetables. ? Lean meats.  Avoid: ? High-fat foods. ? High-sodium foods. ? Foods that are fried, overly processed, or have poor nutritional value.  Stay a healthy weight.  Stay active. Get at least 30 minutes of activity every day.  Do not smoke.  Limit alcohol use to: ? No more than 2 drinks a day for men. ? No more than 1 drink a day for women who are not pregnant.  Do not use illegal drugs.  Keep all doctor visits as told. Get help right away if:  You have sudden weakness or loss of feeling (numbness) on one side of the body, such as the face, arm, or leg.  You have sudden confusion.  You have trouble speaking (aphasia)  or understanding.  You have sudden trouble seeing out of one or both eyes.  You have sudden trouble walking.  You have dizziness or feel like you might pass out (faint).  You have a loss of balance or your movements are not steady (uncoordinated).  You have a sudden, severe headache with no known cause.  You have trouble swallowing (dysphagia). Call your local emergency services (911 in [place] Do notdrive yourself to the clinic or hospital. This information is not intended to replace advice given to you by your health care provider. Make sure you discuss any questions you have with your health care provider. Document Released: 01/04/2012 Document Revised: 06/25/2015 Document Reviewed: 07/18/2012 Elsevier Interactive Patient Education  2018 [place].  Heart Failure Heart failure means your heart has trouble pumping blood. This makes it hard for your body to work well. Heart failure is usually a long-term (chronic) condition. You must take good care of yourself and follow your doctor's  treatment plan. Follow these instructions at home:  Take your heart medicine as told by your doctor. ? Do not stop taking medicine unless your doctor tells you to. ? Do not skip any dose of medicine. ? Refill your medicines before they run out. ? Take other medicines only as told by your doctor or pharmacist.  Stay active if told by your doctor. The elderly and people with severe heart failure should talk with a doctor about physical activity.  Eat heart-healthy foods. Choose foods that are without trans fat and are low in saturated fat, cholesterol, and salt (sodium). This includes fresh or frozen fruits and vegetables, fish, lean meats, fat-free or low-fat dairy foods, whole grains, and high-fiber foods. Lentils and dried peas and beans (legumes) are also good choices.  Limit salt if told by your doctor.  Cook in a healthy way. Roast, grill, broil, bake, poach, steam, or stir-fry foods.  Limit fluids as told by your doctor.  Weigh yourself every morning. Do this after you pee (urinate) and before you eat breakfast. Write down your weight to give to your doctor.  Take your blood pressure and write it down if your doctor tells you to.  Ask your doctor how to check your pulse. Check your pulse as told.  Lose weight if told by your doctor.  Stop smoking or chewing tobacco. Do not use gum or patches that help you quit without your doctor's approval.  Schedule and go to doctor visits as told.  Nonpregnant women should have no more than 1 drink a day. Men should have no more than 2 drinks a day. Talk to your doctor about drinking alcohol.  Stop illegal drug use.  Stay current with shots (immunizations).  Manage your health conditions as told by your doctor.  Learn to manage your stress.  Rest when you are tired.  If it is really hot outside: ? Avoid intense activities. ? Use air conditioning or fans, or get in a cooler place. ? Avoid caffeine and alcohol. ? Wear  loose-fitting, lightweight, and light-colored clothing.  If it is really cold outside: ? Avoid intense activities. ? Layer your clothing. ? Wear mittens or gloves, a hat, and a scarf when going outside. ? Avoid alcohol.  Learn about heart failure and get support as needed.  Get help to maintain or improve your quality of life and your ability to care for yourself as needed. Contact a doctor if:  You gain weight quickly.  You are more short of breath than usual.  You cannot do your normal activities.  You tire easily.  You cough more than normal, especially with activity.  You have any or more puffiness (swelling) in areas such as your hands, feet, ankles, or belly (abdomen).  You cannot sleep because it is hard to breathe.  You feel like your heart is beating fast (palpitations).  You get dizzy or light-headed when you stand up. Get help right away if:  You have trouble breathing.  There is a change in mental status, such as becoming less alert or not being able to focus.  You have chest pain or discomfort.  You faint. This information is not intended to replace advice given to you by your health care provider. Make sure you discuss any questions you have with your health care provider. Document Released: 10/27/2007 Document Revised: 06/25/2015 Document Reviewed: 03/05/2012 Elsevier Interactive Patient Education  2017 [employer].

## 2017-04-06 NOTE — Discharge Summary (Signed)
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 04/02/17 0000 04/02/17 0540 04/02/17 0541 04/03/17 0604 04/04/17 0440 04/06/17 0356  WBC 17.4* 10.8*  --  14.9* 10.0 9.1  NEUTROABS 15.0*  --   --  10.3*  --   --   HGB 9.1* 9.2*  --  8.1* 8.3* 8.8*  HCT 31.1* 31.8* 30.9* 27.7* 28.9* 30.0*  MCV 71.8* 70.8*  --  71.0* 71.7* 71.6*  PLT 360 417*  --  386 368 366   Cardiac Enzymes: Recent Labs  Lab 04/02/17 0540 04/02/17 0951 04/02/17 1729  TROPONINI 0.85* 0.83* 0.78*   BNP: BNP (last 3 results) Recent Labs    04/02/17 0000  BNP 812.0*    ProBNP (last 3 results) No results for input(s): PROBNP in the last 8760 hours.  CBG: Recent Labs  Lab 04/03/17 1141 04/03/17 1644 04/03/17 2152 04/04/17 0740 04/04/17 1116  GLUCAP 99 100* 106* 89 116*       Signed:  [facility] 04/06/2017, 1:58 PM

## 2017-04-06 NOTE — Care Management Note (Addendum)
Case Management Note  Patient Details  Name: Michael [name] MRN: [id number] Date of Birth: 02-23-1955  Subjective/Objective:    CHF               Action/Plan: Patient lives at home independent of all of his ADL's; is a English as a second language teacher, not connected at this time; CM talked to patient at length about getting connected for medical care; also CM talked to patient about the [facility] as another resource until he is connected with the [place]; CM will continue to follow for progression of care; Dahlia Byes, [place] SW with the [facility]- voice mail left for them to contact the patient; also April Alexander with the [facility] called to get the patient connected. CM will continue to follow for progression of care.  12:24 pm- Resources given to patient about who to follow up with at the [facility]; [facility] Letter given to patient to assist him with his medication; Long talk with patient regarding taking care of himself. Mindi Slicker [facility]  Expected Discharge Date:    possibly 04/07/2017             Expected Discharge Plan:   Home  Status of Service:   In progress  Sherrilyn Rist [phone] 04/06/2017, 11:49 AM

## 2017-04-06 NOTE — Plan of Care (Signed)
  Clinical Measurements: Ability to maintain clinical measurements within normal limits will improve 04/06/2017 0129 - Progressing by Theador Hawthorne, RN   Education: Knowledge of General Education information will improve 04/06/2017 0129 - Progressing by Theador Hawthorne, RN   Activity: Risk for activity intolerance will decrease 04/06/2017 0129 - Progressing by Theador Hawthorne, RN   Nutrition: Adequate nutrition will be maintained 04/06/2017 0129 - Progressing by Theador Hawthorne, RN   Safety: Ability to remain free from injury will improve 04/06/2017 0129 - Progressing by Theador Hawthorne, RN   Skin Integrity: Risk for impaired skin integrity will decrease 04/06/2017 0129 - Progressing by Theador Hawthorne, RN

## 2017-04-06 NOTE — Telephone Encounter (Signed)
Sched appt 05/18/17; lab at 3:00 and MD at 3:45. Spoke to pt to inform them of appt.

## 2017-04-06 NOTE — Plan of Care (Signed)
  Education: Knowledge of General Education information will improve 04/06/2017 0131 - Progressing by Theador Hawthorne, RN 04/06/2017 0129 - Progressing by Theador Hawthorne, RN   Clinical Measurements: Ability to maintain clinical measurements within normal limits will improve 04/06/2017 0129 - Progressing by Theador Hawthorne, RN   Clinical Measurements: Will remain free from infection 04/06/2017 0129 - Progressing by Theador Hawthorne, RN   Activity: Risk for activity intolerance will decrease 04/06/2017 0131 - Progressing by Theador Hawthorne, RN 04/06/2017 0129 - Progressing by Theador Hawthorne, RN   Nutrition: Adequate nutrition will be maintained 04/06/2017 0129 - Progressing by Theador Hawthorne, RN   Skin Integrity: Risk for impaired skin integrity will decrease 04/06/2017 0131 - Progressing by Theador Hawthorne, RN 04/06/2017 0129 - Progressing by Theador Hawthorne, RN   Safety: Ability to remain free from injury will improve 04/06/2017 0131 - Progressing by Theador Hawthorne, RN 04/06/2017 0129 - Progressing by Theador Hawthorne, RN

## 2017-04-06 NOTE — Telephone Encounter (Signed)
-----   Message from Mena Goes, RN sent at 04/06/2017  8:32 AM EST ----- Regarding: 4-6 weeks with carotid duplex   ----- Message ----- From: Elam Dutch, MD Sent: 04/05/2017  10:14 PM To: Vvs Charge Pool  Pt needs follow up with me in 4-6 weeks with carotid duplex.  He does not need to go on the list.  Level 4 consult  Ruta Hinds

## 2017-04-07 ENCOUNTER — Other Ambulatory Visit: Payer: Self-pay

## 2017-04-07 DIAGNOSIS — I6529 Occlusion and stenosis of unspecified carotid artery: Secondary | ICD-10-CM

## 2017-04-07 LAB — CULTURE, BLOOD (ROUTINE X 2)
Culture: NO GROWTH
Culture: NO GROWTH
SPECIAL REQUESTS: ADEQUATE
SPECIAL REQUESTS: ADEQUATE

## 2017-04-20 ENCOUNTER — Other Ambulatory Visit (HOSPITAL_COMMUNITY): Payer: Self-pay | Admitting: Otolaryngology

## 2017-04-20 DIAGNOSIS — C44311 Basal cell carcinoma of skin of nose: Secondary | ICD-10-CM

## 2017-04-20 DIAGNOSIS — R220 Localized swelling, mass and lump, head: Principal | ICD-10-CM

## 2017-04-20 DIAGNOSIS — M7989 Other specified soft tissue disorders: Secondary | ICD-10-CM

## 2017-04-28 ENCOUNTER — Encounter (HOSPITAL_COMMUNITY)
Admission: RE | Admit: 2017-04-28 | Discharge: 2017-04-28 | Disposition: A | Discharge status: Home / Self Care | Payer: Medicaid Other | Source: Ambulatory Visit | Attending: Otolaryngology | Admitting: Otolaryngology

## 2017-04-28 DIAGNOSIS — R220 Localized swelling, mass and lump, head: Secondary | ICD-10-CM | POA: Diagnosis not present

## 2017-04-28 DIAGNOSIS — C44311 Basal cell carcinoma of skin of nose: Secondary | ICD-10-CM | POA: Insufficient documentation

## 2017-04-28 DIAGNOSIS — M7989 Other specified soft tissue disorders: Secondary | ICD-10-CM

## 2017-04-28 LAB — GLUCOSE, CAPILLARY: Glucose-Capillary: 102 mg/dL — ABNORMAL HIGH (ref 65–99)

## 2017-04-28 MED ORDER — FLUDEOXYGLUCOSE F - 18 (FDG) INJECTION
9.1400 | Freq: Once | INTRAVENOUS | Status: AC | PRN
  Administered 2017-04-28: 9.14 via INTRAVENOUS

## 2017-05-03 ENCOUNTER — Encounter (HOSPITAL_COMMUNITY): Payer: Self-pay | Admitting: Emergency Medicine

## 2017-05-03 DIAGNOSIS — L989 Disorder of the skin and subcutaneous tissue, unspecified: Secondary | ICD-10-CM

## 2017-05-03 DIAGNOSIS — J3489 Other specified disorders of nose and nasal sinuses: Secondary | ICD-10-CM | POA: Insufficient documentation

## 2017-05-09 ENCOUNTER — Inpatient Hospital Stay (HOSPITAL_COMMUNITY): Payer: Self-pay | Admitting: Hematology

## 2017-05-12 ENCOUNTER — Ambulatory Visit (HOSPITAL_COMMUNITY)
Admission: RE | Admit: 2017-05-12 | Discharge: 2017-05-12 | Disposition: A | Discharge status: Home / Self Care | Payer: Medicaid Other | Source: Ambulatory Visit | Attending: Vascular Surgery | Admitting: Vascular Surgery

## 2017-05-12 ENCOUNTER — Ambulatory Visit (INDEPENDENT_AMBULATORY_CARE_PROVIDER_SITE_OTHER): Payer: Medicaid Other | Admitting: Vascular Surgery

## 2017-05-12 ENCOUNTER — Encounter: Payer: Self-pay | Admitting: Vascular Surgery

## 2017-05-12 VITALS — BP 212/92 | HR 79 | Temp 97.9°F | Resp 20 | Ht 72.0 in | Wt 192.1 lb

## 2017-05-12 DIAGNOSIS — I6523 Occlusion and stenosis of bilateral carotid arteries: Secondary | ICD-10-CM | POA: Insufficient documentation

## 2017-05-12 DIAGNOSIS — C44311 Basal cell carcinoma of skin of nose: Secondary | ICD-10-CM | POA: Insufficient documentation

## 2017-05-12 DIAGNOSIS — I6521 Occlusion and stenosis of right carotid artery: Secondary | ICD-10-CM

## 2017-05-12 DIAGNOSIS — I6529 Occlusion and stenosis of unspecified carotid artery: Secondary | ICD-10-CM

## 2017-05-12 NOTE — Progress Notes (Signed)
There is a 62 year old male who was recently seen as a hospital consult April 04, 2017.  He is currently undergoing a workup and evaluation for resection of a cancer adjacent to the lateral aspect of his right nose.  He was recently seen by the [facility] ENT department regarding this.  He states he does not know when his operative date is scheduled for.  I received paperwork from them today regarding whether or not the patient could have clearance from a vascular surgical standpoint.  The patient continues to deny any symptoms of TIA amaurosis or stroke.  He previously had a carotid duplex scan which showed a greater than 80% right internal carotid artery stenosis with no significant left-sided stenosis.  We repeated his carotid duplex today.  He is on aspirin daily.  Current Outpatient Medications on File Prior to Visit  Medication Sig Dispense Refill  . aspirin 81 MG chewable tablet Chew 1 tablet (81 mg total) by mouth daily. 30 tablet 0  . atorvastatin (LIPITOR) 40 MG tablet Take 1 tablet (40 mg total) by mouth daily at 6 PM. 30 tablet 0  . carvedilol (COREG) 12.5 MG tablet Take 1 tablet (12.5 mg total) by mouth 2 (two) times daily with a meal. 60 tablet 0  . ferrous sulfate 325 (65 FE) MG tablet Take 1 tablet (325 mg total) by mouth 2 (two) times daily with a meal. 60 tablet 0  . furosemide (LASIX) 40 MG tablet Take 1 tablet (40 mg total) by mouth daily. 30 tablet 0  . nicotine (NICODERM CQ - DOSED IN MG/24 HR) 7 mg/24hr patch Place 1 patch (7 mg total) onto the skin daily. 28 patch 0  . potassium chloride SA (K-DUR,KLOR-CON) 20 MEQ tablet Take 1 tablet (20 mEq total) by mouth daily. 14 tablet 0   No current facility-administered medications on file prior to visit.     Review of systems: He denies shortness of breath.  He denies chest pain.  Physical exam:  Vitals:   05/12/17 1129 05/12/17 1131  BP: (!) 214/98 (!) 212/92  Pulse: 79   Resp: 20   Temp: 97.9 F (36.6 C)   TempSrc: Oral   SpO2:  100%   Weight: 192 lb 1.6 oz (87.1 kg)   Height: 6' (1.829 m)     HEENT: 4 x 5 cm cavitary lesion right maxillary region adjacent to the right portion of the nose with no significant drainage  Neck: No carotid bruits  Neuro: Symmetric upper extremity lower extremity strength which is 5/5  Data: Patient had repeat carotid duplex scan which again shows greater than 80% right internal carotid artery stenosis.  No significant left internal carotid artery stenosis.  Assessment: Patient has an asymptomatic greater than 80% right internal carotid artery stenosis.  His risk from this with no symptoms currently is about 5% risk of stroke per year.  I would try to maintain his blood pressure above 614 systolic during the operation and perioperatively.  I would not discontinue his aspirin perioperatively.  After he has resection of his cancer we will evaluate him further whether or not he would be a candidate for carotid endarterectomy or carotid stenting.  I do not believe he needs a carotid intervention prior to his cancer procedure.  He will follow-up with me after his cancer has been resected.  Plan: See above  Ruta Hinds, MD [facility] Office: [phone] Pager: [phone]

## 2017-05-15 ENCOUNTER — Encounter (HOSPITAL_COMMUNITY): Payer: Self-pay | Admitting: Hematology

## 2017-05-15 ENCOUNTER — Other Ambulatory Visit: Payer: Self-pay

## 2017-05-15 ENCOUNTER — Inpatient Hospital Stay (HOSPITAL_COMMUNITY): Payer: Medicaid Other

## 2017-05-15 ENCOUNTER — Inpatient Hospital Stay (HOSPITAL_COMMUNITY): Payer: Medicaid Other | Attending: Hematology | Admitting: Hematology

## 2017-05-15 VITALS — BP 227/84 | HR 80 | Resp 16 | Wt 193.1 lb

## 2017-05-15 DIAGNOSIS — I130 Hypertensive heart and chronic kidney disease with heart failure and stage 1 through stage 4 chronic kidney disease, or unspecified chronic kidney disease: Secondary | ICD-10-CM | POA: Diagnosis not present

## 2017-05-15 DIAGNOSIS — I5032 Chronic diastolic (congestive) heart failure: Secondary | ICD-10-CM | POA: Diagnosis not present

## 2017-05-15 DIAGNOSIS — E538 Deficiency of other specified B group vitamins: Secondary | ICD-10-CM | POA: Diagnosis not present

## 2017-05-15 DIAGNOSIS — J449 Chronic obstructive pulmonary disease, unspecified: Secondary | ICD-10-CM | POA: Insufficient documentation

## 2017-05-15 DIAGNOSIS — C44311 Basal cell carcinoma of skin of nose: Secondary | ICD-10-CM | POA: Diagnosis not present

## 2017-05-15 DIAGNOSIS — D500 Iron deficiency anemia secondary to blood loss (chronic): Secondary | ICD-10-CM | POA: Diagnosis not present

## 2017-05-15 DIAGNOSIS — Z7982 Long term (current) use of aspirin: Secondary | ICD-10-CM

## 2017-05-15 DIAGNOSIS — N189 Chronic kidney disease, unspecified: Secondary | ICD-10-CM | POA: Insufficient documentation

## 2017-05-15 DIAGNOSIS — Z79899 Other long term (current) drug therapy: Secondary | ICD-10-CM | POA: Diagnosis not present

## 2017-05-15 DIAGNOSIS — C44111 Basal cell carcinoma of skin of unspecified eyelid, including canthus: Secondary | ICD-10-CM | POA: Insufficient documentation

## 2017-05-15 DIAGNOSIS — F17210 Nicotine dependence, cigarettes, uncomplicated: Secondary | ICD-10-CM | POA: Insufficient documentation

## 2017-05-15 LAB — VITAMIN B12: VITAMIN B 12: 210 pg/mL (ref 180–914)

## 2017-05-15 LAB — IRON AND TIBC
IRON: 21 ug/dL — AB (ref 45–182)
Saturation Ratios: 5 % — ABNORMAL LOW (ref 17.9–39.5)
TIBC: 410 ug/dL (ref 250–450)
UIBC: 389 ug/dL

## 2017-05-15 LAB — CBC WITH DIFFERENTIAL/PLATELET
Basophils Absolute: 0.1 10*3/uL (ref 0.0–0.1)
Basophils Relative: 1 %
EOS ABS: 0.2 10*3/uL (ref 0.0–0.7)
EOS PCT: 2 %
HEMATOCRIT: 31 % — AB (ref 39.0–52.0)
Hemoglobin: 9.3 g/dL — ABNORMAL LOW (ref 13.0–17.0)
Lymphocytes Relative: 29 %
Lymphs Abs: 2.3 10*3/uL (ref 0.7–4.0)
MCH: 24.1 pg — ABNORMAL LOW (ref 26.0–34.0)
MCHC: 30 g/dL (ref 30.0–36.0)
MCV: 80.3 fL (ref 78.0–100.0)
MONO ABS: 0.7 10*3/uL (ref 0.1–1.0)
MONOS PCT: 9 %
NEUTROS ABS: 4.7 10*3/uL (ref 1.7–7.7)
Neutrophils Relative %: 59 %
Platelets: 426 10*3/uL — ABNORMAL HIGH (ref 150–400)
RBC: 3.86 MIL/uL — ABNORMAL LOW (ref 4.22–5.81)
RDW: 17.8 % — AB (ref 11.5–15.5)
WBC: 8 10*3/uL (ref 4.0–10.5)

## 2017-05-15 LAB — COMPREHENSIVE METABOLIC PANEL
ALBUMIN: 3.5 g/dL (ref 3.5–5.0)
ALT: 18 U/L (ref 17–63)
ANION GAP: 9 (ref 5–15)
AST: 20 U/L (ref 15–41)
Alkaline Phosphatase: 114 U/L (ref 38–126)
BILIRUBIN TOTAL: 0.2 mg/dL — AB (ref 0.3–1.2)
BUN: 14 mg/dL (ref 6–20)
CO2: 29 mmol/L (ref 22–32)
Calcium: 8.9 mg/dL (ref 8.9–10.3)
Chloride: 103 mmol/L (ref 101–111)
Creatinine, Ser: 1.09 mg/dL (ref 0.61–1.24)
GFR calc Af Amer: >60 mL/min (ref >60)
GLUCOSE: 90 mg/dL (ref 65–99)
POTASSIUM: 3.3 mmol/L — AB (ref 3.5–5.1)
Sodium: 141 mmol/L (ref 135–145)
TOTAL PROTEIN: 7.7 g/dL (ref 6.5–8.1)

## 2017-05-15 LAB — FERRITIN: FERRITIN: 15 ng/mL — AB (ref 24–336)

## 2017-05-15 LAB — RETICULOCYTES
RBC.: 3.85 MIL/uL — ABNORMAL LOW (ref 4.22–5.81)
Retic Count, Absolute: 53.9 10*3/uL (ref 19.0–186.0)
Retic Ct Pct: 1.4 % (ref 0.4–3.1)

## 2017-05-15 LAB — LACTATE DEHYDROGENASE: LDH: 90 U/L — ABNORMAL LOW (ref 98–192)

## 2017-05-15 LAB — FOLATE: Folate: 10 ng/mL (ref >5.9)

## 2017-05-15 NOTE — Assessment & Plan Note (Signed)
1.  Locally advanced basal cell carcinoma of the right nasolabial fold: -Biopsy of the right nasal mucosa on 04/05/2017 shows basal cell carcinoma.  Biopsy of the left cheek lesion on 04/02/2017 consistent with basal cell carcinoma. - He was initially evaluated by Dr. Blenda Nicely at [facility] and then was referred to Dr. Mardee Postin at [facility].  He reports that he is being evaluated for surgery on 06/06/2017.  I have talked to him about to [facility] (smoothened homolog) inhibitors, vismodegib and Sonidegib which have clinically useful activity in patients with locally advanced or metastatic basal cell carcinoma.  However I will talk to Dr.Zanation before making a plan.  Vismodegib at 150 mg as a single daily dose, blocks activation of the hedgehog pathway, showed object to response rate of around 68% and locally advanced disease with 33% complete response which is quite impressive.  Serious adverse events were seen in 24% of patients including muscle spasms, change in taste, decreased appetite, weight loss and asthenia.  He also has a nodule below the left cheek lesion for the last 1 year.  This was biopsied on 04/02/2017 which was consistent with basal cell carcinoma.  I have reviewed the results of the PET CT scan with the patient in detail.  2.  Microcytic anemia: His recent hospitalizations shows hemoglobin of 8.8 with an MCV of 60-70.  He denies any bleeding per rectum or melena.  In the recent PET/CT scan there is uptake in the ascending colon and some lymphadenopathy in the iliac region and questionable adenopathy in the porta hepatis.  Patient never had a colonoscopy.  I will check his blood counts today along with ferritin, iron panel, J62, folic acid and SPEP.  We will schedule him for a colonoscopy to rule out a second primary.  I have also recommended parenteral iron therapy with Feraheme weekly x2.  We talked about the side effects of parenteral iron including serious allergic reactions.  He understands and  gives his permission to proceed with the treatment.  3.  Uncontrolled hypertension: Today his systolic blood pressure is 220.  He reports that he has not been taking any medication because of dizziness.  I have talked to him about sending him to the ER because of dangerously elevated high blood pressure.  Patient does not want to go to the emergency room.  I have reviewed his medication list.  He is on carvedilol 12.5 mg twice daily.  He was told to restart it.  He was also started on Lasix when he was admitted to the hospital on 04/01/2017 with acute respiratory failure and was thought to have acute diastolic congestive heart failure.

## 2017-05-15 NOTE — Progress Notes (Signed)
[facility] NOTE  Patient Care Team: Default, Provider, MD as PCP - General Lanny Cramp, MD as Physician Assistant (Otolaryngology)  CHIEF COMPLAINTS/PURPOSE OF CONSULTATION:  Locally advanced basal cell carcinoma.  HISTORY OF PRESENTING ILLNESS:  Michael [name] 62 y.o. male is seen in consultation today for further management of locally advanced basal cell carcinoma.  He was recently admitted to the hospital on 04/01/2017 through 04/06/2017 for acute respiratory failure, thought to be from a combination of COPD exacerbation and acute diastolic CHF.  He has not seen any doctors in the last 30 years.  He has a history of hypertension but does not take any medications.  He is an active smoker for the past 40 years, 1 pack/day.  Denies any bleeding per rectum or melena.  He never had a colonoscopy.  Since his discharge from the hospital, he was sent home on carvedilol 12.5 mg twice daily.  He was also given Lasix and potassium.  He was also told to take iron tablet daily.  He stopped all the medications as he developed lightheadedness.  He did not know which medication was causing it.  He is only taking baby aspirin at this time.  He reports developing a bump on the right nasolabial fold 2-3 years ago.  For the past 4-6 months, it has become ulcerated.  He was evaluated by Dr. Blenda Nicely at [facility].  He was then referred to Dr. Mardee Postin at [facility], who is planning on doing a surgery on 06/06/2017.  Patient also noticed a nodule on the left cheek about 1 year ago.  Patient reports losing a few pounds while he was in the hospital.  He does not report any pain in the right nasolabial fold ulcerated area.  He used to work as an Materials engineer for individuals and businesses.  He is no longer working at this time.  He lives by himself at home.  He is independent of all his ADLs and IADLs.  MEDICAL HISTORY:  Past Medical History:  Diagnosis Date  . CKD (chronic kidney disease)    . Head and neck cancer ([place]) 2019  . HTN (hypertension)     SURGICAL HISTORY: Past Surgical History:  Procedure Laterality Date  . TONSILLECTOMY      SOCIAL HISTORY: Social History   Socioeconomic History  . Marital status: Married    Spouse name: Not on file  . Number of children: Not on file  . Years of education: Not on file  . Highest education level: Not on file  Occupational History  . Not on file  Social Needs  . Financial resource strain: Not on file  . Food insecurity:    Worry: Not on file    Inability: Not on file  . Transportation needs:    Medical: Not on file    Non-medical: Not on file  Tobacco Use  . Smoking status: Current Some Day Smoker    Packs/day: 0.50    Years: 40.00    Pack years: 20.00    Types: Cigarettes  . Smokeless tobacco: Never Used  Substance and Sexual Activity  . Alcohol use: Not Currently    Frequency: Never  . Drug use: Never  . Sexual activity: Not on file  Lifestyle  . Physical activity:    Days per week: Not on file    Minutes per session: Not on file  . Stress: Not on file  Relationships  . Social connections:    Talks on phone: Not  on file    Gets together: Not on file    Attends religious service: Not on file    Active member of club or organization: Not on file    Attends meetings of clubs or organizations: Not on file    Relationship status: Not on file  . Intimate partner violence:    Fear of current or ex partner: Not on file    Emotionally abused: Not on file    Physically abused: Not on file    Forced sexual activity: Not on file  Other Topics Concern  . Not on file  Social History Narrative  . Not on file    FAMILY HISTORY: Family History  Problem Relation Age of Onset  . Stroke Father   . Cirrhosis Mother     ALLERGIES:  has No Known Allergies.  MEDICATIONS:  Current Outpatient Medications  Medication Sig Dispense Refill  . aspirin 81 MG chewable tablet Chew 1 tablet (81 mg total) by mouth  daily. 30 tablet 0  . atorvastatin (LIPITOR) 40 MG tablet Take 1 tablet (40 mg total) by mouth daily at 6 PM. (Patient not taking: Reported on 05/15/2017) 30 tablet 0  . carvedilol (COREG) 12.5 MG tablet Take 1 tablet (12.5 mg total) by mouth 2 (two) times daily with a meal. (Patient not taking: Reported on 05/15/2017) 60 tablet 0  . ferrous sulfate 325 (65 FE) MG tablet Take 1 tablet (325 mg total) by mouth 2 (two) times daily with a meal. (Patient not taking: Reported on 05/15/2017) 60 tablet 0  . furosemide (LASIX) 40 MG tablet Take 1 tablet (40 mg total) by mouth daily. (Patient not taking: Reported on 05/15/2017) 30 tablet 0  . nicotine (NICODERM CQ - DOSED IN MG/24 HR) 7 mg/24hr patch Place 1 patch (7 mg total) onto the skin daily. (Patient not taking: Reported on 05/15/2017) 28 patch 0  . potassium chloride SA (K-DUR,KLOR-CON) 20 MEQ tablet Take 1 tablet (20 mEq total) by mouth daily. (Patient not taking: Reported on 05/15/2017) 14 tablet 0   No current facility-administered medications for this visit.     REVIEW OF SYSTEMS:   Constitutional: Denies fevers, chills or abnormal night sweats Eyes: Denies blurriness of vision, double vision or watery eyes Ears, nose, mouth, throat, and face: Denies mucositis or sore throat Respiratory: Denies cough, dyspnea or wheezes Cardiovascular: Denies palpitation, chest discomfort or lower extremity swelling.  He reported some dizziness when he was taking tablets. Gastrointestinal:  Denies nausea, heartburn or change in bowel habits Skin: Denies abnormal skin rashes Lymphatics: Denies new lymphadenopathy or easy bruising Neurological:Denies numbness, tingling or new weaknesses Behavioral/Psych: Mood is stable, no new changes  All other systems were reviewed with the patient and are negative.  PHYSICAL EXAMINATION: ECOG PERFORMANCE STATUS: 1 - Symptomatic but completely ambulatory  Vitals:   05/15/17 0944  BP: (!) 227/84  Pulse: 80  Resp: 16  SpO2:  100%   Filed Weights   05/15/17 0944  Weight: 193 lb 1.6 oz (87.6 kg)    GENERAL:alert, no distress and comfortable SKIN: skin color, texture, turgor are normal, no rashes or significant lesions.  There is a nodule on the left cheek which is hard and nontender. EYES: normal, conjunctiva are pink and non-injected, sclera clear OROPHARYNX: No oropharyngeal lesions.  However there is an ulcerated area in the right nasolabial fold, extending into the right nasal cavity.   NECK: supple, thyroid normal size, non-tender, without nodularity LYMPH:  no palpable lymphadenopathy in the cervical,  axillary or inguinal LUNGS: clear to auscultation and percussion with normal breathing effort HEART: regular rate & rhythm and no murmurs and no lower extremity edema ABDOMEN:abdomen soft, non-tender and normal bowel sounds Musculoskeletal:no cyanosis of digits and no clubbing  PSYCH: alert & oriented x 3 with fluent speech NEURO: no focal motor/sensory deficits  LABORATORY DATA:  I have reviewed the data as listed Lab Results  Component Value Date   WBC 8.0 05/15/2017   HGB 9.3 (L) 05/15/2017   HCT 31.0 (L) 05/15/2017   MCV 80.3 05/15/2017   PLT 426 (H) 05/15/2017     Chemistry      Component Value Date/Time   NA 141 05/15/2017 1104   NA 140 04/23/2011 0919   K 3.3 (L) 05/15/2017 1104   K 2.4 (LL) 04/23/2011 0919   CL 103 05/15/2017 1104   CL 100 04/23/2011 0919   CO2 29 05/15/2017 1104   CO2 28 04/23/2011 0919   BUN 14 05/15/2017 1104   BUN 17 04/23/2011 0919   CREATININE 1.09 05/15/2017 1104   CREATININE 1.18 04/23/2011 0919      Component Value Date/Time   CALCIUM 8.9 05/15/2017 1104   CALCIUM 8.4 (L) 04/23/2011 0919   ALKPHOS 114 05/15/2017 1104   ALKPHOS 62 04/23/2011 0919   AST 20 05/15/2017 1104   AST 64 (H) 04/23/2011 0919   ALT 18 05/15/2017 1104   ALT 70 04/23/2011 0919   BILITOT 0.2 (L) 05/15/2017 1104   BILITOT 1.0 04/23/2011 0919       RADIOGRAPHIC STUDIES: I  have personally reviewed the radiological images as listed and agreed with the findings in the report. Nm Pet Image Initial (pi) Skull Base To Thigh  Result Date: 04/28/2017 CLINICAL DATA:  Initial treatment strategy for basal cell carcinoma of the nose with right nasal bone invasion. EXAM: NUCLEAR MEDICINE PET SKULL BASE TO THIGH TECHNIQUE: 9.1 mCi F-18 FDG was injected intravenously. Full-ring PET imaging was performed from the skull base to thigh after the radiotracer. CT data was obtained and used for attenuation correction and anatomic localization. Fasting blood glucose: 102 mg/dl COMPARISON:  CT scans from 04/03/2017 and 04/02/2017 FINDINGS: Mediastinal blood pool activity: SUV max 2.3 NECK: In ulcerative mass of the right nasal labial fold is observed, probably extending into the right nostril region, maximum standard uptake value 10.9 the mass abuts the periosteal margin of the right maxilla. There is a polypoid lesion in the left nostril which is not separately appreciably hypermetabolic, maximum SUV 4.7. Along the left lateral cutaneous surface at approximately the vertical level of the maxillary teeth, there is an exophytic 2.2 by 0.8 cm soft tissue density with maximum SUV 8.9. This appearance is suspicious for a separate focus of malignancy. A 0.8 cm in diameter left level IV lymph node on image 44/4 has a maximum SUV of 2.6. Incidental CT findings: A 1.2 by 1.0 cm subcutaneous nodule or lymph node along the left occiput scalp on image 12/4 demonstrates no appreciable accentuated metabolic activity and accordingly is likely benign/incidental. Bilateral carotid atherosclerotic calcification is present. CHEST: The tiny upper lobe nodules shown on recent chest CT in the 3-4 mm range are essentially unchanged from 04/02/2017 and not perceptibly hypermetabolic, although below sensitive PET-CT size thresholds. No worrisome hypermetabolic lesion is identified in the chest. Incidental CT findings: Coronary,  aortic arch, and branch vessel atherosclerotic vascular disease. Mild cardiomegaly. ABDOMEN/PELVIS: Porta hepatis and upper pelvic adenopathy noted with additional small but mildly hypermetabolic retroperitoneal lymph nodes. A  porta hepatis node measuring 1.7 cm in short axis on image 112/4 has a maximum SUV of 4.4. A right common iliac node measuring 1.7 cm in short axis on image 161 has a maximum SUV of 5.6. On the same image, a 1.5 cm left common iliac lymph node has a maximum SUV of 4.7. An aortocaval node measuring 0.9 cm in short axis on image 131 has a maximum SUV of 3.1. A 9 mm left external iliac lymph node has a maximum SUV of 2.1. Multifocal hypermetabolic activity in the ascending colon, disproportionate other portions of the colon. Although possibly physiologic, the possibility of colon mass is not readily excluded particularly in the ascending colon. A more cephalad focus of FDG activity measures 15.2 along the hepatic flexure, and more caudad regions are up to 9.6 in standard uptake value. Incidental CT findings: Cholelithiasis observed with a 3.0 cm gallstone on image 121/4. A 1.4 by 0.8 cm right hepatic lobe lesion on image 108/4 is not hypermetabolic and is probably benign. Aortoiliac atherosclerotic vascular disease. Mild right hydronephrosis and dilated collecting system and proximal ureter noted extending to the UVJ. There are multiple large bladder calculi measuring up to 2.7 cm in diameter, along with massive enlargement of the prostate gland, measuring 7.9 by 7.6 by 8.4 cm (volume = 260 cm^3). Presumably the masslike indentation along the posterior inferior bladder is due to the prostate gland and not a separate bladder tumor. SKELETON: No significant abnormal skeletal hypermetabolic activity. Incidental CT findings: none IMPRESSION: 1. Ulcerative mass along the right nasal labial fold is hypermetabolic with maximum SUV of 10.9. The polypoid lesion in the left nostril has only low-grade  metabolic activity, and may be benign. 2. Along the left facial cutaneous surface about at the level of the maxillary teeth, a focal lesion has a maximum SUV of 8.9, suspicious for a second focus of malignancy. 3. There is very low-grade activity in an 8 mm left level IV lymph node, nonspecific based on the activity level which is only minimally above the blood pool. 4. Porta hepatis and upper pelvic adenopathy with low-grade but abnormal metabolic activity, for example a right common iliac lymph node has a maximum SUV of 5.6. The distribution would be unusual for squamous cell carcinoma from the craniofacial region, but other entities such as low-grade lymphoma or other malignancy such as prostate cancer might be considered. 5. Massive enlargement of the prostate gland at 260 cubic mm. 6. Multiple bladder stones measuring up to 2.7 cm in diameter. There is mild right hydronephrosis and hydroureter which may be due to mass effect from the prostate gland and bladder stones. 7. The tiny upper lobe nodule shown on recent chest CT in the 3-4 mm range are stable and not perceptibly hypermetabolic, but below sensitive PET-CT size thresholds. 8. Multifocal hypermetabolic activity in the ascending colon, disproportionate other portions of the colon. Consider colonoscopy or colo guard test to rule out colon cancer. 9. Other imaging findings of potential clinical significance: Aortic Atherosclerosis (ICD10-I70.0). Coronary and carotid atherosclerosis with mild cardiomegaly. Cholelithiasis. Right hepatic lobe cyst or less likely hemangioma. Electronically Signed   By: Van Clines M.D.   On: 04/28/2017 11:03    ASSESSMENT & PLAN:  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 1.  Locally advanced basal cell carcinoma of the right nasolabial fold: -Biopsy of the right nasal mucosa on 04/05/2017 shows basal cell carcinoma.  Biopsy of the left cheek lesion on 04/02/2017 consistent with basal cell carcinoma. - He  was  initially evaluated by Dr. Blenda Nicely at [facility] and then was referred to Dr. Mardee Postin at [facility].  He reports that he is being evaluated for surgery on 06/06/2017.  I have talked to him about to [facility] (smoothened homolog) inhibitors, vismodegib and Sonidegib which have clinically useful activity in patients with locally advanced or metastatic basal cell carcinoma.  However I will talk to Dr.Zanation before making a plan.  Vismodegib at 150 mg as a single daily dose, blocks activation of the hedgehog pathway, showed object to response rate of around 68% and locally advanced disease with 33% complete response which is quite impressive.  Serious adverse events were seen in 24% of patients including muscle spasms, change in taste, decreased appetite, weight loss and asthenia.  He also has a nodule below the left cheek lesion for the last 1 year.  This was biopsied on 04/02/2017 which was consistent with basal cell carcinoma.  I have reviewed the results of the PET CT scan with the patient in detail.  2.  Microcytic anemia: His recent hospitalizations shows hemoglobin of 8.8 with an MCV of 60-70.  He denies any bleeding per rectum or melena.  In the recent PET/CT scan there is uptake in the ascending colon and some lymphadenopathy in the iliac region and questionable adenopathy in the porta hepatis.  Patient never had a colonoscopy.  I will check his blood counts today along with ferritin, iron panel, C00, folic acid and SPEP.  We will schedule him for a colonoscopy to rule out a second primary.  I have also recommended parenteral iron therapy with Feraheme weekly x2.  We talked about the side effects of parenteral iron including serious allergic reactions.  He understands and gives his permission to proceed with the treatment.  3.  Uncontrolled hypertension: Today his systolic blood pressure is 220.  He reports that he has not been taking any medication because of dizziness.  I have talked to him about sending  him to the ER because of dangerously elevated high blood pressure.  Patient does not want to go to the emergency room.  I have reviewed his medication list.  He is on carvedilol 12.5 mg twice daily.  He was told to restart it.  He was also started on Lasix when he was admitted to the hospital on 04/01/2017 with acute respiratory failure and was thought to have acute diastolic congestive heart failure.  Orders Placed This Encounter  Procedures  . CBC with Differential/Platelet  . Comprehensive metabolic panel  . Ferritin  . Folate  . Iron and TIBC  . Lactate dehydrogenase  . Reticulocytes  . Vitamin B12  . Protein electrophoresis, serum    Standing Status:   Future    Number of Occurrences:   1    Standing Expiration Date:   05/15/2018    All questions were answered. The patient knows to call the clinic with any problems, questions or concerns. Total time spent is 60 minutes with more than 50% of the time spent face-to-face discussing scan results, various treatment options and coordination of care.  Addendum: I have tried calling Dr. Mardee Postin at [facility] to discuss his case.  He is of until Wednesday.  We have left a message for him to call us back.    Derek Jack, MD 05/15/2017 1:35 PM

## 2017-05-16 ENCOUNTER — Inpatient Hospital Stay (HOSPITAL_COMMUNITY): Payer: Medicaid Other

## 2017-05-16 VITALS — BP 191/68 | HR 63 | Temp 97.6°F | Resp 18

## 2017-05-16 DIAGNOSIS — D500 Iron deficiency anemia secondary to blood loss (chronic): Secondary | ICD-10-CM | POA: Diagnosis not present

## 2017-05-16 MED ORDER — SODIUM CHLORIDE 0.9 % IV SOLN
Freq: Once | INTRAVENOUS | Status: AC
  Administered 2017-05-16: 12:00:00 via INTRAVENOUS

## 2017-05-16 MED ORDER — FERUMOXYTOL INJECTION 510 MG/17 ML
510.0000 mg | Freq: Once | INTRAVENOUS | Status: AC
  Administered 2017-05-16: 510 mg via INTRAVENOUS
  Filled 2017-05-16: qty 17

## 2017-05-16 NOTE — Patient Instructions (Signed)
[place] at [facility] Discharge Instruction   Iron given as ordered today. Follow up as scheduled.  Thank you for choosing [place] at [facility] to provide your oncology and hematology care.  To afford each patient quality time with our provider, please arrive at least 15 minutes before your scheduled appointment time.   If you have a lab appointment with the [facility] please come in thru the  Main Entrance and check in at the main information desk  You need to re-schedule your appointment should you arrive 10 or more minutes late.  We strive to give you quality time with our providers, and arriving late affects you and other patients whose appointments are after yours.  Also, if you no show three or more times for appointments you may be dismissed from the clinic at the providers discretion.     Again, thank you for choosing [facility].  Our hope is that these requests will decrease the amount of time that you wait before being seen by our physicians.       _____________________________________________________________  Should you have questions after your visit to [facility], please contact our office at [phone] between the hours of 8:30 a.m. and 4:30 p.m.  Voicemails left after 4:30 p.m. will not be returned until the following business day.  For prescription refill requests, have your pharmacy contact our office.       Resources For Cancer Patients and their Caregivers ? American Cancer Society: Can assist with transportation, wigs, general needs, runs Look Good Feel Better.        ([phone] 174-8760 ? Cancer Care: Provides financial assistance, online support groups, medication/co-pay assistance.  1-800-813-HOPE 434-449-3750) ? [facility] Assists [place] cancer patients and their families through emotional , educational and financial support.  417-390-8039 ? [place]  DSS Where to apply for food stamps, Medicaid and utility assistance. 9292609713 ? [facility]: Transportation to medical appointments. 657-353-6550 ? Social Security Administration: May apply for disability if have a Stage IV cancer. 737-011-0197 203-874-6339 ? [employer], Disability and Transit Services: Assists with nutrition, care and transit needs. [place] Support Programs:   > Cancer Support Group  2nd Tuesday of the month 1pm-2pm, Journey Room   > Creative Journey  3rd Tuesday of the month 1130am-1pm, Journey Room

## 2017-05-16 NOTE — Progress Notes (Signed)
Iron given today per orders.  Patient tolerated it well without problems. Vitals stable and discharged home from clinic ambulatory. Follow up as scheduled.  

## 2017-05-17 LAB — PROTEIN ELECTROPHORESIS, SERUM
A/G RATIO SPE: 0.8 (ref 0.7–1.7)
ALPHA-1-GLOBULIN: 0.4 g/dL (ref 0.0–0.4)
ALPHA-2-GLOBULIN: 0.9 g/dL (ref 0.4–1.0)
Albumin ELP: 3.2 g/dL (ref 2.9–4.4)
Beta Globulin: 1 g/dL (ref 0.7–1.3)
GLOBULIN, TOTAL: 3.8 g/dL (ref 2.2–3.9)
Gamma Globulin: 1.5 g/dL (ref 0.4–1.8)
Total Protein ELP: 7 g/dL (ref 6.0–8.5)

## 2017-05-18 ENCOUNTER — Ambulatory Visit: Payer: Self-pay | Admitting: Vascular Surgery

## 2017-05-18 ENCOUNTER — Encounter (HOSPITAL_COMMUNITY): Payer: Self-pay

## 2017-05-22 ENCOUNTER — Encounter: Payer: Self-pay | Admitting: Internal Medicine

## 2017-05-23 ENCOUNTER — Inpatient Hospital Stay (HOSPITAL_COMMUNITY): Payer: Medicaid Other

## 2017-05-23 VITALS — BP 189/84 | HR 68 | Temp 98.2°F | Resp 18

## 2017-05-23 DIAGNOSIS — D500 Iron deficiency anemia secondary to blood loss (chronic): Secondary | ICD-10-CM

## 2017-05-23 MED ORDER — FUROSEMIDE 40 MG PO TABS: 40.0000 mg | ORAL_TABLET | Freq: Every day | ORAL | 2 refills | Status: DC

## 2017-05-23 MED ORDER — SODIUM CHLORIDE 0.9 % IV SOLN
Freq: Once | INTRAVENOUS | Status: AC
  Administered 2017-05-23: 14:00:00 via INTRAVENOUS

## 2017-05-23 MED ORDER — POTASSIUM CHLORIDE CRYS ER 20 MEQ PO TBCR: 20.0000 meq | EXTENDED_RELEASE_TABLET | Freq: Every day | ORAL | 2 refills | Status: DC

## 2017-05-23 MED ORDER — SODIUM CHLORIDE 0.9 % IV SOLN
510.0000 mg | Freq: Once | INTRAVENOUS | Status: AC
  Administered 2017-05-23: 510 mg via INTRAVENOUS
  Filled 2017-05-23: qty 17

## 2017-05-23 MED ORDER — ATORVASTATIN CALCIUM 40 MG PO TABS: 40.0000 mg | ORAL_TABLET | Freq: Every day | ORAL | 2 refills | Status: DC

## 2017-05-23 MED ORDER — FERROUS SULFATE 325 (65 FE) MG PO TABS: 325.0000 mg | ORAL_TABLET | Freq: Two times a day (BID) | ORAL | 2 refills | Status: DC

## 2017-05-23 MED ORDER — CARVEDILOL 12.5 MG PO TABS: 12.5000 mg | ORAL_TABLET | Freq: Two times a day (BID) | ORAL | 2 refills | Status: DC

## 2017-05-24 ENCOUNTER — Encounter (HOSPITAL_COMMUNITY): Payer: Self-pay

## 2017-05-24 NOTE — Patient Instructions (Signed)
[facility] at [facility] Discharge Instructions  Feraheme given  Follow up as scheduled.   Thank you for choosing [facility] at [facility] to provide your oncology and hematology care.  To afford each patient quality time with our provider, please arrive at least 15 minutes before your scheduled appointment time.   If you have a lab appointment with the Cancer Center please come in thru the  Main Entrance and check in at the main information desk  You need to re-schedule your appointment should you arrive 10 or more minutes late.  We strive to give you quality time with our providers, and arriving late affects you and other patients whose appointments are after yours.  Also, if you no show three or more times for appointments you may be dismissed from the clinic at the providers discretion.     Again, thank you for choosing [facility].  Our hope is that these requests will decrease the amount of time that you wait before being seen by our physicians.       _____________________________________________________________  Should you have questions after your visit to [facility], please contact our office at [phone] between the hours of 8:30 a.m. and 4:30 p.m.  Voicemails left after 4:30 p.m. will not be returned until the following business day.  For prescription refill requests, have your pharmacy contact our office.       Resources For Cancer Patients and their Caregivers ? American Cancer Society: Can assist with transportation, wigs, general needs, runs Look Good Feel Better.        1-888-227-6333 ? Cancer Care: Provides financial assistance, online support groups, medication/co-pay assistance.  1-800-813-HOPE (4673) ? [facility] Assists [place] cancer patients and their families through emotional , educational and financial support.  336-427-4357 ? [place] DSS Where  to apply for food stamps, Medicaid and utility assistance. 336-342-1394 ? [employer]: Transportation to medical appointments. 336-347-2287 ? Social Security Administration: May apply for disability if have a Stage IV cancer. 336-342-7796 1-800-772-1213 ? [place] Aging, Disability and Transit Services: Assists with nutrition, care and transit needs. 336-349-2343  Cancer Center Support Programs:   > Cancer Support Group  2nd Tuesday of the month 1pm-2pm, Journey Room   > Creative Journey  3rd Tuesday of the month 1130am-1pm, Journey Room    

## 2017-05-24 NOTE — Progress Notes (Signed)
Feraheme given today per orders.    Patient tolerated it well without problems. Vitals stable and discharged home from clinic ambulatory. Follow up as scheduled.  Refills for medications sent to pharmacy per Dr. Delton Coombes.

## 2017-05-25 ENCOUNTER — Encounter: Payer: Self-pay | Admitting: *Deleted

## 2017-05-25 ENCOUNTER — Other Ambulatory Visit: Payer: Self-pay | Admitting: *Deleted

## 2017-05-25 ENCOUNTER — Encounter: Payer: Self-pay | Admitting: Nurse Practitioner

## 2017-05-25 ENCOUNTER — Ambulatory Visit (INDEPENDENT_AMBULATORY_CARE_PROVIDER_SITE_OTHER): Payer: Medicaid Other | Admitting: Nurse Practitioner

## 2017-05-25 VITALS — BP 203/90 | HR 64 | Temp 96.6°F | Ht 72.0 in | Wt 194.6 lb

## 2017-05-25 DIAGNOSIS — D649 Anemia, unspecified: Secondary | ICD-10-CM | POA: Diagnosis not present

## 2017-05-25 DIAGNOSIS — R933 Abnormal findings on diagnostic imaging of other parts of digestive tract: Secondary | ICD-10-CM | POA: Insufficient documentation

## 2017-05-25 DIAGNOSIS — R948 Abnormal results of function studies of other organs and systems: Secondary | ICD-10-CM

## 2017-05-25 NOTE — Assessment & Plan Note (Signed)
Noted anemia, currently being seen by oncology/hematology.  Patient denies any hematochezia or melena.  He has never had a colonoscopy before.  They have requested urgent colonoscopy due to abnormal PET scan as per below.  We will schedule this.  If there is no likely etiology of his anemia on colonoscopy and no etiology identified by oncology that we can consider upper endoscopy some point in the future.  He does have a history of chronic kidney disease, although he states he has never heard this before.  His creatinine averages from the 1.1-1.3 range.  This could be contributing to his anemia and a multifactorial etiology.

## 2017-05-25 NOTE — Assessment & Plan Note (Signed)
PET scan completed by oncology with currently advanced basal cell carcinoma of the face plan surgical treatment in a couple weeks at [facility].  There was some uptake in the ascending colon area.  The patient has never had a colonoscopy.  He denies any red flag/warning signs or symptoms.  States he feels just fine from a GI perspective.  There is also some question about lymphadenopathy new to the porta hepatis and iliac region.  At this point we will plan for colonoscopy to further evaluate and help make treatment decisions from an oncological standpoint.  Follow-up based on post procedure recommendations

## 2017-05-25 NOTE — Progress Notes (Signed)
NO PCP PER PATIENT °

## 2017-05-25 NOTE — Patient Instructions (Signed)
1. We will schedule your colonoscopy for you. 2. Further recommendations will be made after your colonoscopy. 3. Follow-up based on recommendations made after your colonoscopy. 4. Call us if you have any questions or concerns   It was good to meet you today!  Best of luck with your upcoming surgery and treatments!!!  At [facility] we value your feedback. You may receive a survey about your visit today. Please share your experience as we strive to create trusting relationships with our patients to provide genuine, compassionate, quality care.

## 2017-05-25 NOTE — Progress Notes (Signed)
Primary Care Physician:  Default, Provider, MD Primary Gastroenterologist:  Dr. Gala Romney  Chief Complaint  Patient presents with  . Colonoscopy    abnormal PET scan; never had tcs    HPI:   Michael [name] is a 62 y.o. male who presents on referral from oncology.  The patient was last seen by oncology for 15 2019 for iron deficiency anemia and locally advanced basal cell carcinoma of the face.  He is scheduled to undergo surgery in May at [facility].  His CT/PET scan with questionable adenopathy around the porta hepatis, some iliac region lymphadenopathy, uptake near the ascending colon.  Recommended ASAP colonoscopy to rule out an additional primary in order to help with treatment decisions.  He does have microcytic anemia with recent hemoglobin of 8.8 and MCV of 60-70.  However, with oncology he denied any melena or hematochezia.  Has never had a colonoscopy before.  Today he states he's doing ok overall. Denies abdominal pain, N/V, hematochezia, melena, fever, chills, unintentional weight loss (other than during hospitalization which he has regained), acute changes in bowel movements/habits. Denies chest pain, dyspnea, dizziness, lightheadedness, syncope, near syncope. Denies any other upper or lower GI symptoms.  Past Medical History:  Diagnosis Date  . CKD (chronic kidney disease)   . Head and neck cancer ([place]) 2019  . HTN (hypertension)     Past Surgical History:  Procedure Laterality Date  . TONSILLECTOMY      Current Outpatient Medications  Medication Sig Dispense Refill  . aspirin 81 MG chewable tablet Chew 1 tablet (81 mg total) by mouth daily. 30 tablet 0  . atorvastatin (LIPITOR) 40 MG tablet Take 1 tablet (40 mg total) by mouth daily at 6 PM. 30 tablet 2  . carvedilol (COREG) 12.5 MG tablet Take 1 tablet (12.5 mg total) by mouth 2 (two) times daily with a meal. 60 tablet 2  . ferrous sulfate 325 (65 FE) MG tablet Take 1 tablet (325 mg total) by mouth 2 (two) times daily with a  meal. 60 tablet 2  . furosemide (LASIX) 40 MG tablet Take 1 tablet (40 mg total) by mouth daily. 30 tablet 2  . potassium chloride SA (K-DUR,KLOR-CON) 20 MEQ tablet Take 1 tablet (20 mEq total) by mouth daily. 14 tablet 2  . nicotine (NICODERM CQ - DOSED IN MG/24 HR) 7 mg/24hr patch Place 1 patch (7 mg total) onto the skin daily. (Patient not taking: Reported on 05/15/2017) 28 patch 0   No current facility-administered medications for this visit.     Allergies as of 05/25/2017  . (No Known Allergies)    Family History  Problem Relation Age of Onset  . Stroke Father   . Cirrhosis Mother   . Colon cancer Neg Hx     Social History   Socioeconomic History  . Marital status: Married    Spouse name: Not on file  . Number of children: Not on file  . Years of education: Not on file  . Highest education level: Not on file  Occupational History  . Not on file  Social Needs  . Financial resource strain: Not on file  . Food insecurity:    Worry: Not on file    Inability: Not on file  . Transportation needs:    Medical: Not on file    Non-medical: Not on file  Tobacco Use  . Smoking status: Current Some Day Smoker    Packs/day: 0.10    Years: 40.00    Pack  years: 4.00    Types: Cigarettes  . Smokeless tobacco: Never Used  Substance and Sexual Activity  . Alcohol use: Not Currently    Frequency: Never  . Drug use: Never  . Sexual activity: Not on file  Lifestyle  . Physical activity:    Days per week: Not on file    Minutes per session: Not on file  . Stress: Not on file  Relationships  . Social connections:    Talks on phone: Not on file    Gets together: Not on file    Attends religious service: Not on file    Active member of club or organization: Not on file    Attends meetings of clubs or organizations: Not on file    Relationship status: Not on file  . Intimate partner violence:    Fear of current or ex partner: Not on file    Emotionally abused: Not on file     Physically abused: Not on file    Forced sexual activity: Not on file  Other Topics Concern  . Not on file  Social History Narrative  . Not on file    Review of Systems: General: Negative for anorexia, weight loss, fever, chills, fatigue, weakness. ENT: Negative for hoarseness, difficulty swallowing , nasal congestion. CV: Negative for chest pain, angina, palpitations, dyspnea on exertion, peripheral edema.  Respiratory: Negative for dyspnea at rest, dyspnea on exertion, cough, sputum, wheezing.  GI: See history of present illness. GU:  Negative for dysuria, hematuria, urinary incontinence, urinary frequency, nocturnal urination.  Derm: Negative for rash or itching. Noted right facial BCC. Endo: Negative for unusual weight change.  Heme: Negative for bruising or bleeding. Allergy: Negative for rash or hives.    Physical Exam: BP (!) 203/90   Pulse 64   Temp (!) 96.6 F (35.9 C) (Oral)   Ht 6' (1.829 m)   Wt 194 lb 9.6 oz (88.3 kg)   BMI 26.39 kg/m  General:   Alert and oriented. Pleasant and cooperative. Well-nourished and well-developed.  Head:  Normocephalic and atraumatic. Right facial area with a deep wound consistent with advanced BCC s/p extensive biopsy. Eyes:  Without icterus, sclera clear and conjunctiva pink.  Ears:  Normal auditory acuity. Cardiovascular:  S1, S2 present without murmurs appreciated. Extremities without clubbing or edema. Respiratory:  Clear to auscultation bilaterally. No wheezes, rales, or rhonchi. No distress.  Gastrointestinal:  +BS, soft, non-tender and non-distended. No HSM noted. No guarding or rebound. No masses appreciated.  Rectal:  Deferred  Musculoskalatal:  Symmetrical without gross deformities. Normal posture. Neurologic:  Alert and oriented x4;  grossly normal neurologically. Psych:  Alert and cooperative. Normal mood and affect. Heme/Lymph/Immune: No excessive bruising noted.    05/25/2017 11:42 AM   Disclaimer: This note was  dictated with voice recognition software. Similar sounding words can inadvertently be transcribed and may not be corrected upon review.

## 2017-05-25 NOTE — H&P (View-Only) (Signed)
Primary Care Physician:  Default, Provider, MD Primary Gastroenterologist:  Dr. Gala Romney  Chief Complaint  Patient presents with  . Colonoscopy    abnormal PET scan; never had tcs    HPI:   Michael [name] is a 62 y.o. male who presents on referral from oncology.  The patient was last seen by oncology for 15 2019 for iron deficiency anemia and locally advanced basal cell carcinoma of the face.  He is scheduled to undergo surgery in May at [facility].  His CT/PET scan with questionable adenopathy around the porta hepatis, some iliac region lymphadenopathy, uptake near the ascending colon.  Recommended ASAP colonoscopy to rule out an additional primary in order to help with treatment decisions.  He does have microcytic anemia with recent hemoglobin of 8.8 and MCV of 60-70.  However, with oncology he denied any melena or hematochezia.  Has never had a colonoscopy before.  Today he states he's doing ok overall. Denies abdominal pain, N/V, hematochezia, melena, fever, chills, unintentional weight loss (other than during hospitalization which he has regained), acute changes in bowel movements/habits. Denies chest pain, dyspnea, dizziness, lightheadedness, syncope, near syncope. Denies any other upper or lower GI symptoms.  Past Medical History:  Diagnosis Date  . CKD (chronic kidney disease)   . Head and neck cancer ([place]) 2019  . HTN (hypertension)     Past Surgical History:  Procedure Laterality Date  . TONSILLECTOMY      Current Outpatient Medications  Medication Sig Dispense Refill  . aspirin 81 MG chewable tablet Chew 1 tablet (81 mg total) by mouth daily. 30 tablet 0  . atorvastatin (LIPITOR) 40 MG tablet Take 1 tablet (40 mg total) by mouth daily at 6 PM. 30 tablet 2  . carvedilol (COREG) 12.5 MG tablet Take 1 tablet (12.5 mg total) by mouth 2 (two) times daily with a meal. 60 tablet 2  . ferrous sulfate 325 (65 FE) MG tablet Take 1 tablet (325 mg total) by mouth 2 (two) times daily with a  meal. 60 tablet 2  . furosemide (LASIX) 40 MG tablet Take 1 tablet (40 mg total) by mouth daily. 30 tablet 2  . potassium chloride SA (K-DUR,KLOR-CON) 20 MEQ tablet Take 1 tablet (20 mEq total) by mouth daily. 14 tablet 2  . nicotine (NICODERM CQ - DOSED IN MG/24 HR) 7 mg/24hr patch Place 1 patch (7 mg total) onto the skin daily. (Patient not taking: Reported on 05/15/2017) 28 patch 0   No current facility-administered medications for this visit.     Allergies as of 05/25/2017  . (No Known Allergies)    Family History  Problem Relation Age of Onset  . Stroke Father   . Cirrhosis Mother   . Colon cancer Neg Hx     Social History   Socioeconomic History  . Marital status: Married    Spouse name: Not on file  . Number of children: Not on file  . Years of education: Not on file  . Highest education level: Not on file  Occupational History  . Not on file  Social Needs  . Financial resource strain: Not on file  . Food insecurity:    Worry: Not on file    Inability: Not on file  . Transportation needs:    Medical: Not on file    Non-medical: Not on file  Tobacco Use  . Smoking status: Current Some Day Smoker    Packs/day: 0.10    Years: 40.00    Pack  years: 4.00    Types: Cigarettes  . Smokeless tobacco: Never Used  Substance and Sexual Activity  . Alcohol use: Not Currently    Frequency: Never  . Drug use: Never  . Sexual activity: Not on file  Lifestyle  . Physical activity:    Days per week: Not on file    Minutes per session: Not on file  . Stress: Not on file  Relationships  . Social connections:    Talks on phone: Not on file    Gets together: Not on file    Attends religious service: Not on file    Active member of club or organization: Not on file    Attends meetings of clubs or organizations: Not on file    Relationship status: Not on file  . Intimate partner violence:    Fear of current or ex partner: Not on file    Emotionally abused: Not on file     Physically abused: Not on file    Forced sexual activity: Not on file  Other Topics Concern  . Not on file  Social History Narrative  . Not on file    Review of Systems: General: Negative for anorexia, weight loss, fever, chills, fatigue, weakness. ENT: Negative for hoarseness, difficulty swallowing , nasal congestion. CV: Negative for chest pain, angina, palpitations, dyspnea on exertion, peripheral edema.  Respiratory: Negative for dyspnea at rest, dyspnea on exertion, cough, sputum, wheezing.  GI: See history of present illness. GU:  Negative for dysuria, hematuria, urinary incontinence, urinary frequency, nocturnal urination.  Derm: Negative for rash or itching. Noted right facial BCC. Endo: Negative for unusual weight change.  Heme: Negative for bruising or bleeding. Allergy: Negative for rash or hives.    Physical Exam: BP (!) 203/90   Pulse 64   Temp (!) 96.6 F (35.9 C) (Oral)   Ht 6' (1.829 m)   Wt 194 lb 9.6 oz (88.3 kg)   BMI 26.39 kg/m  General:   Alert and oriented. Pleasant and cooperative. Well-nourished and well-developed.  Head:  Normocephalic and atraumatic. Right facial area with a deep wound consistent with advanced BCC s/p extensive biopsy. Eyes:  Without icterus, sclera clear and conjunctiva pink.  Ears:  Normal auditory acuity. Cardiovascular:  S1, S2 present without murmurs appreciated. Extremities without clubbing or edema. Respiratory:  Clear to auscultation bilaterally. No wheezes, rales, or rhonchi. No distress.  Gastrointestinal:  +BS, soft, non-tender and non-distended. No HSM noted. No guarding or rebound. No masses appreciated.  Rectal:  Deferred  Musculoskalatal:  Symmetrical without gross deformities. Normal posture. Neurologic:  Alert and oriented x4;  grossly normal neurologically. Psych:  Alert and cooperative. Normal mood and affect. Heme/Lymph/Immune: No excessive bruising noted.    05/25/2017 11:42 AM   Disclaimer: This note was  dictated with voice recognition software. Similar sounding words can inadvertently be transcribed and may not be corrected upon review.

## 2017-05-29 ENCOUNTER — Other Ambulatory Visit: Payer: Self-pay

## 2017-05-29 ENCOUNTER — Inpatient Hospital Stay (HOSPITAL_COMMUNITY): Payer: Self-pay | Attending: Hematology

## 2017-05-29 ENCOUNTER — Encounter (HOSPITAL_COMMUNITY): Payer: Self-pay | Admitting: Hematology

## 2017-05-29 ENCOUNTER — Inpatient Hospital Stay (HOSPITAL_BASED_OUTPATIENT_CLINIC_OR_DEPARTMENT_OTHER): Payer: Medicaid Other | Admitting: Hematology

## 2017-05-29 ENCOUNTER — Inpatient Hospital Stay (HOSPITAL_COMMUNITY): Payer: Medicaid Other

## 2017-05-29 VITALS — BP 186/74 | HR 66 | Temp 97.7°F | Resp 18 | Wt 189.6 lb

## 2017-05-29 DIAGNOSIS — C44311 Basal cell carcinoma of skin of nose: Secondary | ICD-10-CM

## 2017-05-29 DIAGNOSIS — Z7982 Long term (current) use of aspirin: Secondary | ICD-10-CM

## 2017-05-29 DIAGNOSIS — Z79899 Other long term (current) drug therapy: Secondary | ICD-10-CM

## 2017-05-29 DIAGNOSIS — F17210 Nicotine dependence, cigarettes, uncomplicated: Secondary | ICD-10-CM

## 2017-05-29 DIAGNOSIS — D500 Iron deficiency anemia secondary to blood loss (chronic): Secondary | ICD-10-CM

## 2017-05-29 DIAGNOSIS — E538 Deficiency of other specified B group vitamins: Secondary | ICD-10-CM | POA: Diagnosis not present

## 2017-05-29 DIAGNOSIS — I130 Hypertensive heart and chronic kidney disease with heart failure and stage 1 through stage 4 chronic kidney disease, or unspecified chronic kidney disease: Secondary | ICD-10-CM

## 2017-05-29 DIAGNOSIS — N189 Chronic kidney disease, unspecified: Secondary | ICD-10-CM

## 2017-05-29 DIAGNOSIS — J449 Chronic obstructive pulmonary disease, unspecified: Secondary | ICD-10-CM

## 2017-05-29 DIAGNOSIS — I5032 Chronic diastolic (congestive) heart failure: Secondary | ICD-10-CM

## 2017-05-29 LAB — CBC
HEMATOCRIT: 34.8 % — AB (ref 39.0–52.0)
Hemoglobin: 10.4 g/dL — ABNORMAL LOW (ref 13.0–17.0)
MCH: 25.4 pg — ABNORMAL LOW (ref 26.0–34.0)
MCHC: 29.9 g/dL — AB (ref 30.0–36.0)
MCV: 84.9 fL (ref 78.0–100.0)
PLATELETS: 298 10*3/uL (ref 150–400)
RBC: 4.1 MIL/uL — ABNORMAL LOW (ref 4.22–5.81)
RDW: 20.7 % — ABNORMAL HIGH (ref 11.5–15.5)
WBC: 9.6 10*3/uL (ref 4.0–10.5)

## 2017-05-29 MED ORDER — CYANOCOBALAMIN 1000 MCG/ML IJ SOLN
INTRAMUSCULAR | Status: AC
  Filled 2017-05-29: qty 1

## 2017-05-29 MED ORDER — CYANOCOBALAMIN 1000 MCG/ML IJ SOLN
1000.0000 ug | Freq: Once | INTRAMUSCULAR | Status: AC
  Administered 2017-05-29: 1000 ug via INTRAMUSCULAR

## 2017-05-29 NOTE — Progress Notes (Signed)
Michael [name], [place] [address]   CLINIC:  Medical Oncology/Hematology  PCP:  Patient, No Pcp Per No address on file None   REASON FOR VISIT:  Follow-up for basal cell carcinoma and iron deficiency anemia.  CURRENT THERAPY: Intermittent Feraheme infusions.   INTERVAL HISTORY:  Michael [name] 62 y.o. male returns for routine follow-up of iron deficiency anemia.  He received Feraheme infusions weekly on 05/16/2017 and 05/23/2017.  He denies any bleeding per rectum or melena.  He is taking Coreg 12.5 mg twice daily.  He is also taking Lipitor and aspirin.  He denies any chest pains, headaches or lightheadedness.  He was evaluated by GI and a colonoscopy scheduled this Wednesday.   REVIEW OF SYSTEMS:  Review of Systems  Constitutional: Positive for fatigue.  All other systems reviewed and are negative.    PAST MEDICAL/SURGICAL HISTORY:  Past Medical History:  Diagnosis Date  . CKD (chronic kidney disease)   . Head and neck cancer ([place]) 2019  . HTN (hypertension)    Past Surgical History:  Procedure Laterality Date  . TONSILLECTOMY       SOCIAL HISTORY:  Social History   Socioeconomic History  . Marital status: Married    Spouse name: Not on file  . Number of children: Not on file  . Years of education: Not on file  . Highest education level: Not on file  Occupational History  . Not on file  Social Needs  . Financial resource strain: Not on file  . Food insecurity:    Worry: Not on file    Inability: Not on file  . Transportation needs:    Medical: Not on file    Non-medical: Not on file  Tobacco Use  . Smoking status: Current Some Day Smoker    Packs/day: 0.10    Years: 40.00    Pack years: 4.00    Types: Cigarettes  . Smokeless tobacco: Never Used  Substance and Sexual Activity  . Alcohol use: Not Currently    Frequency: Never  . Drug use: Never  . Sexual activity: Not on file  Lifestyle  . Physical activity:   Days per week: Not on file    Minutes per session: Not on file  . Stress: Not on file  Relationships  . Social connections:    Talks on phone: Not on file    Gets together: Not on file    Attends religious service: Not on file    Active member of club or organization: Not on file    Attends meetings of clubs or organizations: Not on file    Relationship status: Not on file  . Intimate partner violence:    Fear of current or ex partner: Not on file    Emotionally abused: Not on file    Physically abused: Not on file    Forced sexual activity: Not on file  Other Topics Concern  . Not on file  Social History Narrative  . Not on file    FAMILY HISTORY:  Family History  Problem Relation Age of Onset  . Stroke Father   . Cirrhosis Mother   . Colon cancer Neg Hx     CURRENT MEDICATIONS:  Outpatient Encounter Medications as of 05/29/2017  Medication Sig  . aspirin 81 MG chewable tablet Chew 1 tablet (81 mg total) by mouth daily.  Marland Kitchen atorvastatin (LIPITOR) 40 MG tablet Take 1 tablet (40 mg total) by mouth daily at 6 PM.  .  carvedilol (COREG) 12.5 MG tablet Take 1 tablet (12.5 mg total) by mouth 2 (two) times daily with a meal.  . ferrous sulfate 325 (65 FE) MG tablet Take 1 tablet (325 mg total) by mouth 2 (two) times daily with a meal.  . furosemide (LASIX) 40 MG tablet Take 1 tablet (40 mg total) by mouth daily.  . potassium chloride SA (K-DUR,KLOR-CON) 20 MEQ tablet Take 1 tablet (20 mEq total) by mouth daily.  . [EXPIRED] cyanocobalamin ((VITAMIN B-12)) injection 1,000 mcg    No facility-administered encounter medications on file as of 05/29/2017.     ALLERGIES:  No Known Allergies   PHYSICAL EXAM:  ECOG Performance status: 1  Vitals:   05/29/17 1611  BP: (!) 186/74  Pulse: 66  Resp: 18  Temp: 97.7 F (36.5 C)  SpO2: 100%   Filed Weights   05/29/17 1611  Weight: 189 lb 9.6 oz (86 kg)    Physical Exam Deferred.  LABORATORY DATA:  I have reviewed the labs as  listed.  CBC    Component Value Date/Time   WBC 9.6 05/29/2017 1600   RBC 4.10 (L) 05/29/2017 1600   HGB 10.4 (L) 05/29/2017 1600   HGB 13.8 04/23/2011 0919   HCT 34.8 (L) 05/29/2017 1600   HCT 30.9 (L) 04/02/2017 0541   PLT 298 05/29/2017 1600   PLT 208 04/23/2011 0919   MCV 84.9 05/29/2017 1600   MCV 84 04/23/2011 0919   MCH 25.4 (L) 05/29/2017 1600   MCHC 29.9 (L) 05/29/2017 1600   RDW 20.7 (H) 05/29/2017 1600   RDW 12.3 04/23/2011 0919   LYMPHSABS 2.3 05/15/2017 1104   LYMPHSABS 1.1 04/23/2011 0919   MONOABS 0.7 05/15/2017 1104   MONOABS 0.9 (H) 04/23/2011 0919   EOSABS 0.2 05/15/2017 1104   EOSABS 0.0 04/23/2011 0919   BASOSABS 0.1 05/15/2017 1104   BASOSABS 0.0 04/23/2011 0919   CMP Latest Ref Rng & Units 05/15/2017 04/06/2017 04/05/2017  Glucose 65 - 99 mg/dL 90 94 95  BUN 6 - 20 mg/dL 14 23(H) 23(H)  Creatinine 0.61 - 1.24 mg/dL 1.09 1.24 1.22  Sodium 135 - 145 mmol/L 141 140 141  Potassium 3.5 - 5.1 mmol/L 3.3(L) 3.2(L) 3.5  Chloride 101 - 111 mmol/L 103 106 107  CO2 22 - 32 mmol/L 29 26 25   Calcium 8.9 - 10.3 mg/dL 8.9 8.5(L) 8.6(L)  Total Protein 6.5 - 8.1 g/dL 7.7 - -  Total Bilirubin 0.3 - 1.2 mg/dL 0.2(L) - -  Alkaline Phos 38 - 126 U/L 114 - -  AST 15 - 41 U/L 20 - -  ALT 17 - 63 U/L 18 - -         ASSESSMENT & PLAN:   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 1.  Locally advanced basal cell carcinoma of the right nasolabial fold: -Biopsy of the right nasal mucosa on 04/05/2017 shows basal cell carcinoma.  Biopsy of the left cheek lesion on 04/02/2017 consistent with basal cell carcinoma. - He was initially evaluated by Dr. Blenda Nicely at [facility] and then was referred to Dr. Mardee Postin at [facility].  He reports that he is being evaluated for surgery on 06/06/2017.  I have talked to him about to [facility] (smoothened homolog) inhibitors, vismodegib and Sonidegib which have clinically useful activity in patients with locally advanced or metastatic basal  cell carcinoma.  I have phoned and talked to Dr. Mardee Postin who informed me that hedgehog inhibitors will not have that much activity when the  tumor is involving the bone.  Patient is scheduled for resection on 06/06/2017.  I will see him back in 4 weeks after surgical resection.  If there are positive margins, options include radiation therapy and vismodegib at that time.  2.  Microcytic anemia: He received 2 infusions of Feraheme on 05/16/2017 and 05/23/2017.  His ferritin on 05/15/2017 was 15.  His hemoglobin improved to 10.4 today.  He was seen by GI, and is scheduled to have colonoscopy this Wednesday.  PET CT scan recently done showed uptake in the ascending colon and some lymphadenopathy in the iliac region.  We will follow-up on the colonoscopy.  His B12 was found to be low at 210.  We have given a B12 shot in our office today.  He was told to take 1 mg B12 tablet daily.  His SPEP was negative.  3.  Uncontrolled hypertension: Patient started back on Coreg 12.5 mg twice daily.  His blood pressure is better at 180/74 today.  He will also continue Lipitor 40 mg daily and aspirin 81 mg daily.      Orders placed this encounter:  Orders Placed This Encounter  Procedures  . [place], [place] [phone]

## 2017-05-29 NOTE — Assessment & Plan Note (Addendum)
1.  Locally advanced basal cell carcinoma of the right nasolabial fold: -Biopsy of the right nasal mucosa on 04/05/2017 shows basal cell carcinoma.  Biopsy of the left cheek lesion on 04/02/2017 consistent with basal cell carcinoma. - He was initially evaluated by Dr. Blenda Nicely at [facility] and then was referred to Dr. Mardee Postin at [facility].  He reports that he is being evaluated for surgery on 06/06/2017.  I have talked to him about to [facility] (smoothened homolog) inhibitors, vismodegib and Sonidegib which have clinically useful activity in patients with locally advanced or metastatic basal cell carcinoma.  I have phoned and talked to Dr. Mardee Postin who informed me that hedgehog inhibitors will not have that much activity when the tumor is involving the bone.  Patient is scheduled for resection on 06/06/2017.  I will see him back in 4 weeks after surgical resection.  If there are positive margins, options include radiation therapy and vismodegib at that time.  2.  Microcytic anemia: He received 2 infusions of Feraheme on 05/16/2017 and 05/23/2017.  His ferritin on 05/15/2017 was 15.  His hemoglobin improved to 10.4 today.  He was seen by GI, and is scheduled to have colonoscopy this Wednesday.  PET CT scan recently done showed uptake in the ascending colon and some lymphadenopathy in the iliac region.  We will follow-up on the colonoscopy.  His B12 was found to be low at 210.  We have given a B12 shot in our office today.  He was told to take 1 mg B12 tablet daily.  His SPEP was negative.  3.  Uncontrolled hypertension: Patient started back on Coreg 12.5 mg twice daily.  His blood pressure is better at 180/74 today.  He will also continue Lipitor 40 mg daily and aspirin 81 mg daily.

## 2017-05-29 NOTE — Patient Instructions (Signed)
[place] at [facility] Discharge Instructions  Received Vit B12 injection today. Follow-up as scheduled. Call clinic for any questions   Thank you for choosing [place] at [facility] to provide your oncology and hematology care.  To afford each patient quality time with our provider, please arrive at least 15 minutes before your scheduled appointment time.   If you have a lab appointment with the [facility] please come in thru the  Main Entrance and check in at the main information desk  You need to re-schedule your appointment should you arrive 10 or more minutes late.  We strive to give you quality time with our providers, and arriving late affects you and other patients whose appointments are after yours.  Also, if you no show three or more times for appointments you may be dismissed from the clinic at the providers discretion.     Again, thank you for choosing [facility].  Our hope is that these requests will decrease the amount of time that you wait before being seen by our physicians.       _____________________________________________________________  Should you have questions after your visit to [facility], please contact our office at [phone] between the hours of 8:30 a.m. and 4:30 p.m.  Voicemails left after 4:30 p.m. will not be returned until the following business day.  For prescription refill requests, have your pharmacy contact our office.       Resources For Cancer Patients and their Caregivers ? American Cancer Society: Can assist with transportation, wigs, general needs, runs Look Good Feel Better.        850-250-0546 ? Cancer Care: Provides financial assistance, online support groups, medication/co-pay assistance.  1-800-813-HOPE 619-520-0263) ? [place] Assists [place] cancer patients and their families through emotional , educational and financial  support.  561-623-4700 ? [place] DSS Where to apply for food stamps, Medicaid and utility assistance. (860)416-5112 ? [facility]: Transportation to medical appointments. 484-826-7057 ? Social Security Administration: May apply for disability if have a Stage IV cancer. 612-383-6858 820-861-6544 ? [employer], Disability and Transit Services: Assists with nutrition, care and transit needs. [place] Support Programs:   > Cancer Support Group  2nd Tuesday of the month 1pm-2pm, Journey Room   > Creative Journey  3rd Tuesday of the month 1130am-1pm, Journey Room

## 2017-05-29 NOTE — Progress Notes (Signed)
Michael [name] tolerated Vit B12 injection well without complaints or incident. Pt discharged self ambulatory in satisfactory condition

## 2017-05-29 NOTE — Patient Instructions (Signed)
[facility] at [facility] Discharge Instructions  Today you saw Dr. K.   Thank you for choosing [facility] at [facility] to provide your oncology and hematology care.  To afford each patient quality time with our provider, please arrive at least 15 minutes before your scheduled appointment time.   If you have a lab appointment with the Cancer Center please come in thru the  Main Entrance and check in at the main information desk  You need to re-schedule your appointment should you arrive 10 or more minutes late.  We strive to give you quality time with our providers, and arriving late affects you and other patients whose appointments are after yours.  Also, if you no show three or more times for appointments you may be dismissed from the clinic at the providers discretion.     Again, thank you for choosing [facility].  Our hope is that these requests will decrease the amount of time that you wait before being seen by our physicians.       _____________________________________________________________  Should you have questions after your visit to [facility], please contact our office at [phone] between the hours of 8:30 a.m. and 4:30 p.m.  Voicemails left after 4:30 p.m. will not be returned until the following business day.  For prescription refill requests, have your pharmacy contact our office.       Resources For Cancer Patients and their Caregivers ? American Cancer Society: Can assist with transportation, wigs, general needs, runs Look Good Feel Better.        1-888-227-6333 ? Cancer Care: Provides financial assistance, online support groups, medication/co-pay assistance.  1-800-813-HOPE (4673) ? [facility] Assists [place] cancer patients and their families through emotional , educational and financial support.  336-427-4357 ? [place] DSS Where to apply for food  stamps, Medicaid and utility assistance. 336-342-1394 ? [facility]: Transportation to medical appointments. 336-347-2287 ? Social Security Administration: May apply for disability if have a Stage IV cancer. 336-342-7796 1-800-772-1213 ? [place] Aging, Disability and Transit Services: Assists with nutrition, care and transit needs. 336-349-2343  Cancer Center Support Programs:   > Cancer Support Group  2nd Tuesday of the month 1pm-2pm, Journey Room   > Creative Journey  3rd Tuesday of the month 1130am-1pm, Journey Room    

## 2017-05-29 NOTE — Progress Notes (Signed)
Michael [name] presents today for injection per the provider's orders.  B12 administration without incident; see MAR for injection details.  Patient tolerated procedure well and without incident.  No questions or complaints noted at this time.  Discharged ambulatory.

## 2017-05-30 ENCOUNTER — Telehealth: Payer: Self-pay

## 2017-05-30 NOTE — Telephone Encounter (Signed)
Called pt. TCS for 05/31/17 changed to 7:30am, arrive at 6:30am. Advised to start drinking prep at 2:30am tomorrow, NPO after 4:30am. Endo scheduler aware.

## 2017-05-31 ENCOUNTER — Other Ambulatory Visit: Payer: Self-pay

## 2017-05-31 ENCOUNTER — Ambulatory Visit (HOSPITAL_COMMUNITY)
Admission: RE | Admit: 2017-05-31 | Discharge: 2017-05-31 | Disposition: A | Discharge status: Home / Self Care | Payer: Medicaid Other | Source: Ambulatory Visit | Attending: Internal Medicine | Admitting: Internal Medicine

## 2017-05-31 ENCOUNTER — Encounter (HOSPITAL_COMMUNITY)
Admission: RE | Disposition: A | Discharge status: Home / Self Care | Payer: Self-pay | Source: Ambulatory Visit | Attending: Internal Medicine

## 2017-05-31 ENCOUNTER — Encounter (HOSPITAL_COMMUNITY): Payer: Self-pay

## 2017-05-31 DIAGNOSIS — D123 Benign neoplasm of transverse colon: Secondary | ICD-10-CM | POA: Diagnosis not present

## 2017-05-31 DIAGNOSIS — F17210 Nicotine dependence, cigarettes, uncomplicated: Secondary | ICD-10-CM | POA: Diagnosis not present

## 2017-05-31 DIAGNOSIS — D509 Iron deficiency anemia, unspecified: Secondary | ICD-10-CM | POA: Insufficient documentation

## 2017-05-31 DIAGNOSIS — Z7982 Long term (current) use of aspirin: Secondary | ICD-10-CM | POA: Insufficient documentation

## 2017-05-31 DIAGNOSIS — R933 Abnormal findings on diagnostic imaging of other parts of digestive tract: Secondary | ICD-10-CM

## 2017-05-31 DIAGNOSIS — N189 Chronic kidney disease, unspecified: Secondary | ICD-10-CM | POA: Diagnosis not present

## 2017-05-31 DIAGNOSIS — R948 Abnormal results of function studies of other organs and systems: Secondary | ICD-10-CM

## 2017-05-31 DIAGNOSIS — D122 Benign neoplasm of ascending colon: Secondary | ICD-10-CM | POA: Diagnosis not present

## 2017-05-31 DIAGNOSIS — I129 Hypertensive chronic kidney disease with stage 1 through stage 4 chronic kidney disease, or unspecified chronic kidney disease: Secondary | ICD-10-CM | POA: Diagnosis not present

## 2017-05-31 DIAGNOSIS — Z79899 Other long term (current) drug therapy: Secondary | ICD-10-CM | POA: Insufficient documentation

## 2017-05-31 DIAGNOSIS — D124 Benign neoplasm of descending colon: Secondary | ICD-10-CM | POA: Insufficient documentation

## 2017-05-31 DIAGNOSIS — D649 Anemia, unspecified: Secondary | ICD-10-CM

## 2017-05-31 HISTORY — PX: POLYPECTOMY: SHX5525

## 2017-05-31 HISTORY — PX: COLONOSCOPY: SHX5424

## 2017-05-31 SURGERY — COLONOSCOPY
Anesthesia: Moderate Sedation

## 2017-05-31 MED ORDER — MEPERIDINE HCL 100 MG/ML IJ SOLN
INTRAMUSCULAR | Status: DC | PRN
  Administered 2017-05-31: 25 mg via INTRAVENOUS
  Administered 2017-05-31 (×2): 50 mg via INTRAVENOUS

## 2017-05-31 MED ORDER — MIDAZOLAM HCL 5 MG/5ML IJ SOLN
INTRAMUSCULAR | Status: AC
  Filled 2017-05-31: qty 10

## 2017-05-31 MED ORDER — STERILE WATER FOR IRRIGATION IR SOLN
Status: DC | PRN
  Administered 2017-05-31: 2.5 mL

## 2017-05-31 MED ORDER — ONDANSETRON HCL 4 MG/2ML IJ SOLN
INTRAMUSCULAR | Status: DC | PRN
  Administered 2017-05-31: 4 mg via INTRAVENOUS

## 2017-05-31 MED ORDER — SPOT INK MARKER SYRINGE KIT
PACK | SUBMUCOSAL | Status: AC
  Filled 2017-05-31: qty 5

## 2017-05-31 MED ORDER — MEPERIDINE HCL 100 MG/ML IJ SOLN
INTRAMUSCULAR | Status: DC
Start: 2017-05-31 — End: 2017-05-31
  Filled 2017-05-31: qty 2

## 2017-05-31 MED ORDER — MIDAZOLAM HCL 5 MG/5ML IJ SOLN
INTRAMUSCULAR | Status: DC | PRN
  Administered 2017-05-31: 2 mg via INTRAVENOUS
  Administered 2017-05-31: 1 mg via INTRAVENOUS
  Administered 2017-05-31: 2 mg via INTRAVENOUS
  Administered 2017-05-31: 1 mg via INTRAVENOUS

## 2017-05-31 MED ORDER — SODIUM CHLORIDE 0.9 % IV SOLN
INTRAVENOUS | Status: DC
  Administered 2017-05-31: 07:00:00 via INTRAVENOUS

## 2017-05-31 MED ORDER — ONDANSETRON HCL 4 MG/2ML IJ SOLN
INTRAMUSCULAR | Status: AC
  Filled 2017-05-31: qty 2

## 2017-05-31 MED ORDER — SPOT INK MARKER SYRINGE KIT
PACK | SUBMUCOSAL | Status: DC | PRN
  Administered 2017-05-31: 3 mL via SUBMUCOSAL

## 2017-05-31 NOTE — Discharge Instructions (Signed)
°  Colonoscopy Discharge Instructions  Read the instructions outlined below and refer to this sheet in the next few weeks. These discharge instructions provide you with general information on caring for yourself after you leave the hospital. Your doctor may also give you specific instructions. While your treatment has been planned according to the most current medical practices available, unavoidable complications occasionally occur. If you have any problems or questions after discharge, call Dr. Gala Romney at [phone]. ACTIVITY  You may resume your regular activity, but move at a slower pace for the next 24 hours.   Take frequent rest periods for the next 24 hours.   Walking will help get rid of the air and reduce the bloated feeling in your belly (abdomen).   No driving for 24 hours (because of the medicine (anesthesia) used during the test).    Do not sign any important legal documents or operate any machinery for 24 hours (because of the anesthesia used during the test).  NUTRITION  Drink plenty of fluids.   You may resume your normal diet as instructed by your doctor.   Begin with a light meal and progress to your normal diet. Heavy or fried foods are harder to digest and may make you feel sick to your stomach (nauseated).   Avoid alcoholic beverages for 24 hours or as instructed.  MEDICATIONS  You may resume your normal medications unless your doctor tells you otherwise.  WHAT YOU CAN EXPECT TODAY  Some feelings of bloating in the abdomen.   Passage of more gas than usual.   Spotting of blood in your stool or on the toilet paper.  IF YOU HAD POLYPS REMOVED DURING THE COLONOSCOPY:  No aspirin products for 7 days or as instructed.   No alcohol for 7 days or as instructed.   Eat a soft diet for the next 24 hours.  FINDING OUT THE RESULTS OF YOUR TEST Not all test results are available during your visit. If your test results are not back during the visit, make an appointment  with your caregiver to find out the results. Do not assume everything is normal if you have not heard from your caregiver or the medical facility. It is important for you to follow up on all of your test results.  SEEK IMMEDIATE MEDICAL ATTENTION IF:  You have more than a spotting of blood in your stool.   Your belly is swollen (abdominal distention).   You are nauseated or vomiting.   You have a temperature over 101.   You have abdominal pain or discomfort that is severe or gets worse throughout the day.    Multiple large polypoid lesions in your right colon likely the reason the PET scan was abnormal - they cannot all be removed efficiently/safely by colonoscopy. I recommend you will be best served by having the right sided of your colon removed.    You had 7 more polyps on the left side of your colon which were removed/  I anticipate referral to a surgeon pending review of pathology report

## 2017-05-31 NOTE — Op Note (Signed)
[facility] Patient Name: Michael [name] Procedure Date: 05/31/2017 7:11 AM MRN: [id number] Date of Birth: [birth year] Attending MD: Norvel Richards , MD CSN: [id number] Age: 62 Admit Type: Outpatient Procedure:                Colonoscopy Indications:              Abnormal PET scan of the GI tract Providers:                Norvel Richards, MD, Lurline Del, RN, Randa Spike, Technician Referring MD:              Medicines:                Midazolam 6 mg IV, Meperidine 169 mg IV Complications:            No immediate complications. Estimated Blood Loss:     Estimated blood loss was minimal. Procedure:                Pre-Anesthesia Assessment:                           - Prior to the procedure, a History and Physical                            was performed, and patient medications and                            allergies were reviewed. The patient's tolerance of                            previous anesthesia was also reviewed. The risks                            and benefits of the procedure and the sedation                            options and risks were discussed with the patient.                            All questions were answered, and informed consent                            was obtained. Prior Anticoagulants: The patient has                            taken no previous anticoagulant or antiplatelet                            agents. ASA Grade Assessment: III - A patient with                            severe systemic disease. After reviewing the risks  and benefits, the patient was deemed in                            satisfactory condition to undergo the procedure.                           After obtaining informed consent, the colonoscope                            was passed under direct vision. Throughout the                            procedure, the patient's blood pressure, pulse, and                             oxygen saturations were monitored continuously. The                            EC-[id number] ([id number]) scope was introduced through                            the anus and advanced to the the cecum, identified                            by appendiceal orifice and ileocecal valve. The                            colonoscopy was performed without difficulty. The                            patient tolerated the procedure well. The quality                            of the bowel preparation was adequate. The                            ileocecal valve, appendiceal orifice, and rectum                            were photographed. The entire colon was well                            visualized. Scope In: 7:52:03 AM Scope Out: 8:23:43 AM Scope Withdrawal Time: 0 hours 25 minutes 31 seconds  Total Procedure Duration: 0 hours 31 minutes 40 seconds  Findings:      The perianal and digital rectal examinations were normal.      Six sessile polyps were found in the descending colon. The polyps were 5       to 8 mm in size. These polyps were removed with a cold snare. Resection       and retrieval were complete. Estimated blood loss was minimal.      An 8 mm polyp was found in the descending colon. The polyp was sessile.       The polyp was removed with a hot  snare. Resection and retrieval were       complete. Estimated blood loss: none.      In the area beginning at the hepatic flexure, extending all the way to       the cecum there were multiple polyps ranging from 5 mm to 1.5 cm densely       populating this segment of colon. There were approximately 8-10 polyps       greater than 1.25 cm in dimensions. Theses were sessile and       pedunculated. In the area of the distal ascending segment/hepatic       flexure, there was a 3.5 cm polypoid mass on a flexure.      It was felt that all of these polyps could not be removed practically       via colonoscopy even in the setting of multiple close  interval       colonoscopies. Therefore, the largest polypoid lesion mentioned above       was biopsied with jumbo biopsy forceps. The remaining polyps from the       hepatic flexure proximally were not manipulated.      Approximately 10 cm distal to the above clustering of polyps, the colon       was circumferentially tattooed with ink.      No additional abnormalities were found on retroflexion. Impression:               - Six 5 to 8 mm polyps in the descending colon,                            removed with a cold snare. Resected and retrieved.                           - One 8 mm polyp in the descending colon, removed                            with a hot snare. Resected and retrieved. MULTIPLE                            LARGE AND SMALL POLYPS LOCATED FROM THE AREA OF                            HEPATIC FLEXURE TO THE CECUM ALONG WITH A POLYPOID                            MASS (BIOPSIED) NOT FELT TO BE REMOVABLE COMPLETELY                            ENDOSCOPICALLY. COLON FROM TATOO LINE PROXIMALLY                            NEEDS RESECTED TO INSURE REMOVAL OF PERSISTING                            LESIONS. Moderate Sedation:      Moderate (conscious) sedation was administered by the endoscopy nurse       and supervised by the endoscopist. The following parameters  were       monitored: oxygen saturation, heart rate, blood pressure, respiratory       rate, EKG, adequacy of pulmonary ventilation, and response to care.       Total physician intraservice time was 45 minutes. Recommendation:           - Patient has a contact number available for                            emergencies. The signs and symptoms of potential                            delayed complications were discussed with the                            patient. Return to normal activities tomorrow.                            Written discharge instructions were provided to the                            patient.                            - Advance diet as tolerated.                           - Continue present medications. Anticipate referral                            to a surgeon in the near future.                           - Repeat colonoscopy date to be determined after                            pending pathology results are reviewed for                            surveillance. Procedure Code(s):        --- Professional ---                           [phone], Colonoscopy, flexible; with removal of                            tumor(s), polyp(s), or other lesion(s) by snare                            technique                           G0500, Moderate sedation services provided by the                            same physician or other qualified health care  professional performing a gastrointestinal                            endoscopic service that sedation supports,                            requiring the presence of an independent trained                            observer to assist in the monitoring of the                            patient's level of consciousness and physiological                            status; initial 15 minutes of intra-service time;                            patient age 58 years or older (additional time may                            be reported with [phone], as appropriate)                           [phone], Moderate sedation services provided by the                            same physician or other qualified health care                            professional performing the diagnostic or                            therapeutic service that the sedation supports,                            requiring the presence of an independent trained                            observer to assist in the monitoring of the                            patient's level of consciousness and physiological                            status; each additional 15 minutes intraservice                             time (List separately in addition to code for                            primary service)                           [phone],  Moderate sedation services provided by the                            same physician or other qualified health care                            professional performing the diagnostic or                            therapeutic service that the sedation supports,                            requiring the presence of an independent trained                            observer to assist in the monitoring of the                            patient's level of consciousness and physiological                            status; each additional 15 minutes intraservice                            time (List separately in addition to code for                            primary service) Diagnosis Code(s):        --- Professional ---                           D12.4, Benign neoplasm of descending colon                           R93.3, Abnormal findings on diagnostic imaging of                            other parts of digestive tract CPT copyright 2017 American Medical Association. All rights reserved. The codes documented in this report are preliminary and upon coder review may  be revised to meet current compliance requirements. Cristopher Estimable. Jayren Cease, MD Norvel Richards, MD 05/31/2017 8:48:13 AM This report has been signed electronically. Number of Addenda: 0

## 2017-05-31 NOTE — Interval H&P Note (Signed)
History and Physical Interval Note:  05/31/2017 7:34 AM  Michael [name]  has presented today for surgery, with the diagnosis of abnormal CT, anemia  The various methods of treatment have been discussed with the patient and family. After consideration of risks, benefits and other options for treatment, the patient has consented to  Procedure(s) with comments: COLONOSCOPY (N/A) - 2:45pm as a surgical intervention .  The patient's history has been reviewed, patient examined, no change in status, stable for surgery.  I have reviewed the patient's chart and labs.  Questions were answered to the patient's satisfaction.     Michael [name]  No change. Diagnostic colonoscopy per plan today.  The risks, benefits, limitations, alternatives and imponderables have been reviewed with the patient. Questions have been answered. All parties are agreeable.

## 2017-06-05 ENCOUNTER — Encounter (HOSPITAL_COMMUNITY): Payer: Self-pay | Admitting: Internal Medicine

## 2017-06-07 ENCOUNTER — Ambulatory Visit: Payer: Self-pay | Admitting: Gastroenterology

## 2017-06-08 ENCOUNTER — Ambulatory Visit: Payer: Self-pay | Admitting: Vascular Surgery

## 2017-06-08 ENCOUNTER — Encounter (HOSPITAL_COMMUNITY): Payer: Self-pay

## 2017-06-09 ENCOUNTER — Encounter: Payer: Self-pay | Admitting: Internal Medicine

## 2017-06-13 ENCOUNTER — Telehealth: Payer: Self-pay | Admitting: Internal Medicine

## 2017-06-13 DIAGNOSIS — K6389 Other specified diseases of intestine: Secondary | ICD-10-CM

## 2017-06-13 NOTE — Telephone Encounter (Signed)
Per [facility] result letter from 5/1 colonoscopy~ It is recommended that you have an operation to remove the right side of your colon to ensure all the above-mentioned polyps or removed.

## 2017-06-13 NOTE — Telephone Encounter (Signed)
Per surgical pathology result note, [facility] has tried to call pt. Will send referral after pt is aware of results.

## 2017-06-14 NOTE — Addendum Note (Signed)
Addended by: Hassan Rowan on: 06/14/2017 03:17 PM   Modules accepted: Orders

## 2017-06-14 NOTE — Telephone Encounter (Signed)
See surgical path result note, pt aware of results. Referral sent to Dr. Constance Haw via [place].

## 2017-06-24 ENCOUNTER — Other Ambulatory Visit: Payer: Self-pay

## 2017-06-24 ENCOUNTER — Observation Stay (HOSPITAL_COMMUNITY)
Admission: EM | Admit: 2017-06-24 | Discharge: 2017-06-25 | Disposition: A | Discharge status: Home / Self Care | Payer: Medicaid Other | Attending: Internal Medicine | Admitting: Internal Medicine

## 2017-06-24 DIAGNOSIS — Z7982 Long term (current) use of aspirin: Secondary | ICD-10-CM | POA: Diagnosis not present

## 2017-06-24 DIAGNOSIS — I5042 Chronic combined systolic (congestive) and diastolic (congestive) heart failure: Secondary | ICD-10-CM | POA: Diagnosis not present

## 2017-06-24 DIAGNOSIS — Y69 Unspecified misadventure during surgical and medical care: Secondary | ICD-10-CM | POA: Diagnosis not present

## 2017-06-24 DIAGNOSIS — N179 Acute kidney failure, unspecified: Secondary | ICD-10-CM | POA: Diagnosis not present

## 2017-06-24 DIAGNOSIS — R55 Syncope and collapse: Secondary | ICD-10-CM

## 2017-06-24 DIAGNOSIS — C44311 Basal cell carcinoma of skin of nose: Secondary | ICD-10-CM | POA: Diagnosis not present

## 2017-06-24 DIAGNOSIS — I5030 Unspecified diastolic (congestive) heart failure: Secondary | ICD-10-CM | POA: Diagnosis not present

## 2017-06-24 DIAGNOSIS — D649 Anemia, unspecified: Secondary | ICD-10-CM | POA: Diagnosis not present

## 2017-06-24 DIAGNOSIS — F17210 Nicotine dependence, cigarettes, uncomplicated: Secondary | ICD-10-CM | POA: Diagnosis not present

## 2017-06-24 DIAGNOSIS — I779 Disorder of arteries and arterioles, unspecified: Secondary | ICD-10-CM | POA: Diagnosis present

## 2017-06-24 DIAGNOSIS — D508 Other iron deficiency anemias: Secondary | ICD-10-CM

## 2017-06-24 DIAGNOSIS — C44111 Basal cell carcinoma of skin of unspecified eyelid, including canthus: Secondary | ICD-10-CM | POA: Diagnosis present

## 2017-06-24 DIAGNOSIS — T50905A Adverse effect of unspecified drugs, medicaments and biological substances, initial encounter: Secondary | ICD-10-CM

## 2017-06-24 DIAGNOSIS — I130 Hypertensive heart and chronic kidney disease with heart failure and stage 1 through stage 4 chronic kidney disease, or unspecified chronic kidney disease: Secondary | ICD-10-CM | POA: Diagnosis not present

## 2017-06-24 DIAGNOSIS — R9431 Abnormal electrocardiogram [ECG] [EKG]: Secondary | ICD-10-CM

## 2017-06-24 DIAGNOSIS — Z8589 Personal history of malignant neoplasm of other organs and systems: Secondary | ICD-10-CM | POA: Diagnosis not present

## 2017-06-24 DIAGNOSIS — N189 Chronic kidney disease, unspecified: Secondary | ICD-10-CM | POA: Diagnosis not present

## 2017-06-24 DIAGNOSIS — T887XXA Unspecified adverse effect of drug or medicament, initial encounter: Secondary | ICD-10-CM | POA: Diagnosis not present

## 2017-06-24 DIAGNOSIS — D509 Iron deficiency anemia, unspecified: Secondary | ICD-10-CM | POA: Diagnosis present

## 2017-06-24 DIAGNOSIS — I951 Orthostatic hypotension: Principal | ICD-10-CM

## 2017-06-24 DIAGNOSIS — Z79899 Other long term (current) drug therapy: Secondary | ICD-10-CM | POA: Insufficient documentation

## 2017-06-24 DIAGNOSIS — R42 Dizziness and giddiness: Secondary | ICD-10-CM | POA: Diagnosis not present

## 2017-06-24 DIAGNOSIS — R011 Cardiac murmur, unspecified: Secondary | ICD-10-CM

## 2017-06-24 DIAGNOSIS — I10 Essential (primary) hypertension: Secondary | ICD-10-CM

## 2017-06-24 DIAGNOSIS — I739 Peripheral vascular disease, unspecified: Secondary | ICD-10-CM

## 2017-06-24 LAB — CBC WITH DIFFERENTIAL/PLATELET
Basophils Absolute: 0 10*3/uL (ref 0.0–0.1)
Basophils Relative: 0 %
EOS PCT: 1 %
Eosinophils Absolute: 0.1 10*3/uL (ref 0.0–0.7)
HEMATOCRIT: 25.4 % — AB (ref 39.0–52.0)
Hemoglobin: 7.8 g/dL — ABNORMAL LOW (ref 13.0–17.0)
LYMPHS ABS: 1.8 10*3/uL (ref 0.7–4.0)
LYMPHS PCT: 18 %
MCH: 25.3 pg — AB (ref 26.0–34.0)
MCHC: 30.7 g/dL (ref 30.0–36.0)
MCV: 82.5 fL (ref 78.0–100.0)
MONO ABS: 1.2 10*3/uL — AB (ref 0.1–1.0)
Monocytes Relative: 12 %
Neutro Abs: 6.9 10*3/uL (ref 1.7–7.7)
Neutrophils Relative %: 69 %
PLATELETS: 470 10*3/uL — AB (ref 150–400)
RBC: 3.08 MIL/uL — AB (ref 4.22–5.81)
RDW: 17.2 % — ABNORMAL HIGH (ref 11.5–15.5)
WBC: 10 10*3/uL (ref 4.0–10.5)

## 2017-06-24 LAB — COMPREHENSIVE METABOLIC PANEL
ALT: 21 U/L (ref 17–63)
AST: 23 U/L (ref 15–41)
Albumin: 3.2 g/dL — ABNORMAL LOW (ref 3.5–5.0)
Alkaline Phosphatase: 76 U/L (ref 38–126)
Anion gap: 9 (ref 5–15)
BILIRUBIN TOTAL: 0.6 mg/dL (ref 0.3–1.2)
BUN: 19 mg/dL (ref 6–20)
CALCIUM: 9 mg/dL (ref 8.9–10.3)
CO2: 26 mmol/L (ref 22–32)
CREATININE: 1.52 mg/dL — AB (ref 0.61–1.24)
Chloride: 103 mmol/L (ref 101–111)
GFR, EST AFRICAN AMERICAN: 55 mL/min — AB (ref >60)
GFR, EST NON AFRICAN AMERICAN: 47 mL/min — AB (ref >60)
Glucose, Bld: 93 mg/dL (ref 65–99)
Potassium: 3.4 mmol/L — ABNORMAL LOW (ref 3.5–5.1)
Sodium: 138 mmol/L (ref 135–145)
TOTAL PROTEIN: 6.2 g/dL — AB (ref 6.5–8.1)

## 2017-06-24 LAB — RETICULOCYTES
RBC.: 3.06 MIL/uL — AB (ref 4.22–5.81)
RETIC CT PCT: 0.9 % (ref 0.4–3.1)
Retic Count, Absolute: 27.5 10*3/uL (ref 19.0–186.0)

## 2017-06-24 LAB — I-STAT CHEM 8, ED
BUN: 16 mg/dL (ref 6–20)
CHLORIDE: 104 mmol/L (ref 101–111)
CREATININE: 1.5 mg/dL — AB (ref 0.61–1.24)
Calcium, Ion: 1.15 mmol/L (ref 1.15–1.40)
GLUCOSE: 95 mg/dL (ref 65–99)
HCT: 23 % — ABNORMAL LOW (ref 39.0–52.0)
Hemoglobin: 7.8 g/dL — ABNORMAL LOW (ref 13.0–17.0)
POTASSIUM: 3.2 mmol/L — AB (ref 3.5–5.1)
Sodium: 142 mmol/L (ref 135–145)
TCO2: 24 mmol/L (ref 22–32)

## 2017-06-24 LAB — PREPARE RBC (CROSSMATCH)

## 2017-06-24 LAB — ABO/RH: ABO/RH(D): O POS

## 2017-06-24 MED ORDER — ASPIRIN 81 MG PO CHEW
81.0000 mg | CHEWABLE_TABLET | Freq: Every day | ORAL | Status: DC
  Administered 2017-06-25: 81 mg via ORAL
  Filled 2017-06-24: qty 1

## 2017-06-24 MED ORDER — AMOXICILLIN-POT CLAVULANATE 875-125 MG PO TABS
1.0000 | ORAL_TABLET | Freq: Two times a day (BID) | ORAL | Status: DC
  Administered 2017-06-24 – 2017-06-25 (×2): 1 via ORAL
  Filled 2017-06-24 (×2): qty 1

## 2017-06-24 MED ORDER — LISINOPRIL 5 MG PO TABS
5.0000 mg | ORAL_TABLET | Freq: Every day | ORAL | Status: DC
  Administered 2017-06-25: 5 mg via ORAL
  Filled 2017-06-24: qty 1

## 2017-06-24 MED ORDER — POTASSIUM CHLORIDE CRYS ER 20 MEQ PO TBCR
20.0000 meq | EXTENDED_RELEASE_TABLET | Freq: Every day | ORAL | Status: DC
  Administered 2017-06-25: 20 meq via ORAL
  Filled 2017-06-24: qty 1

## 2017-06-24 MED ORDER — FERUMOXYTOL INJECTION 510 MG/17 ML
INTRAVENOUS | Status: AC
  Filled 2017-06-24: qty 17

## 2017-06-24 MED ORDER — LACTATED RINGERS IV BOLUS
1000.0000 mL | Freq: Once | INTRAVENOUS | Status: AC
  Administered 2017-06-24: 1000 mL via INTRAVENOUS

## 2017-06-24 MED ORDER — ONDANSETRON HCL 4 MG/2ML IJ SOLN: 4.0000 mg | Freq: Four times a day (QID) | INTRAMUSCULAR | Status: DC | PRN

## 2017-06-24 MED ORDER — SODIUM CHLORIDE 0.9 % IV SOLN
Freq: Once | INTRAVENOUS | Status: AC
  Administered 2017-06-25: 01:00:00 via INTRAVENOUS

## 2017-06-24 MED ORDER — LACTATED RINGERS IV BOLUS: 1000.0000 mL | Freq: Once | INTRAVENOUS | Status: DC

## 2017-06-24 MED ORDER — ONDANSETRON HCL 4 MG PO TABS: 4.0000 mg | ORAL_TABLET | Freq: Four times a day (QID) | ORAL | Status: DC | PRN

## 2017-06-24 MED ORDER — ACETAMINOPHEN 325 MG PO TABS: 650.0000 mg | ORAL_TABLET | Freq: Four times a day (QID) | ORAL | Status: DC | PRN

## 2017-06-24 MED ORDER — LACTATED RINGERS IV BOLUS
500.0000 mL | Freq: Once | INTRAVENOUS | Status: AC
  Administered 2017-06-24: 500 mL via INTRAVENOUS

## 2017-06-24 MED ORDER — ENOXAPARIN SODIUM 40 MG/0.4ML ~~LOC~~ SOLN
40.0000 mg | SUBCUTANEOUS | Status: DC
  Filled 2017-06-24: qty 0.4

## 2017-06-24 MED ORDER — SODIUM CHLORIDE 0.9 % IV SOLN
510.0000 mg | Freq: Once | INTRAVENOUS | Status: AC
  Administered 2017-06-24: 510 mg via INTRAVENOUS
  Filled 2017-06-24: qty 17

## 2017-06-24 MED ORDER — POTASSIUM CHLORIDE IN NACL 40-0.9 MEQ/L-% IV SOLN
INTRAVENOUS | Status: DC
  Administered 2017-06-24: 75 mL/h via INTRAVENOUS

## 2017-06-24 MED ORDER — ATORVASTATIN CALCIUM 40 MG PO TABS: 40.0000 mg | ORAL_TABLET | Freq: Every day | ORAL | Status: DC

## 2017-06-24 MED ORDER — ACETAMINOPHEN 650 MG RE SUPP: 650.0000 mg | Freq: Four times a day (QID) | RECTAL | Status: DC | PRN

## 2017-06-24 MED ORDER — CARVEDILOL 3.125 MG PO TABS
6.2500 mg | ORAL_TABLET | Freq: Two times a day (BID) | ORAL | Status: DC
  Administered 2017-06-25: 6.25 mg via ORAL
  Filled 2017-06-24: qty 2

## 2017-06-24 NOTE — ED Provider Notes (Signed)
[facility] EMERGENCY DEPARTMENT Provider Note   CSN: [id number] Arrival date & time: 06/24/17  1704     History   Chief Complaint Chief Complaint  Patient presents with  . Near Syncope    HPI Michael [name] is a 62 y.o. male.  HPI  62 year old comes in with chief complaint of syncope.  Patient has history of CKD, head and neck cancer status post recent skin graft placement over the right cheek, CHF.  Patient states that he was started on lisinopril 5 mg chest today.  After taking the medications he started feeling dizzy and had 3 episodes of syncope.  Patient states that all of his episodes were preceded by him getting up, feeling dizzy, diaphoretic and then fainting.  Patient denies any chest pain, shortness of breath, palpitations, headaches, neck pain.  In addition to the new lisinopril dose started today patient was also started on Lasix about a month ago.  Past Medical History:  Diagnosis Date  . CKD (chronic kidney disease)   . Head and neck cancer ([place]) 2019  . HTN (hypertension)     Patient Active Problem List   Diagnosis Date Noted  . Syncope due to orthostatic hypotension 06/24/2017  . Heart murmur 06/24/2017  . Abnormal EKG 06/24/2017  . Abnormal PET scan of colon 05/25/2017  . 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 Periapical abscess 04/04/2017  . Iron (Fe) deficiency anemia 04/04/2017  . Acute diastolic CHF (congestive heart failure) ([place]) 04/04/2017  . Essential hypertension 04/02/2017  . Anemia 04/02/2017  . Acute CHF (congestive heart failure) ([place]) 04/02/2017  . Acute hypoxemic respiratory failure ([place]) 04/02/2017  . Lactic acidosis 04/02/2017  . Tobacco abuse 04/02/2017  . CHF (congestive heart failure) ([place]) 04/02/2017  . Elevated troponin 04/02/2017  . Thrombocytosis ([place]) 04/02/2017  . Hypokalemia 04/02/2017  . Skin lesion of face   . Nasal lesion     Past Surgical History:  Procedure Laterality Date  . ANKLE  CLOSED REDUCTION Right   . COLONOSCOPY N/A 05/31/2017   Procedure: COLONOSCOPY;  Surgeon: Daneil Dolin, MD;  [facility];  Service: Endoscopy;  Laterality: N/A;  2:45pm  . POLYPECTOMY  05/31/2017   Procedure: POLYPECTOMY;  Surgeon: Daneil Dolin, MD;  Location: [facility];  Service: Endoscopy;;  . TONSILLECTOMY          Home Medications    Prior to Admission medications   Medication Sig Start Date End Date Taking? Authorizing Provider  aspirin 81 MG chewable tablet Chew 1 tablet (81 mg total) by mouth daily. 04/07/17  Yes Mikhail, Velta Addison, DO  atorvastatin (LIPITOR) 40 MG tablet Take 1 tablet (40 mg total) by mouth daily at 6 PM. 05/23/17  Yes Derek Jack, MD  carvedilol (COREG) 12.5 MG tablet Take 1 tablet (12.5 mg total) by mouth 2 (two) times daily with a meal. 05/23/17  Yes Derek Jack, MD  ferrous sulfate 325 (65 FE) MG tablet Take 1 tablet (325 mg total) by mouth 2 (two) times daily with a meal. 05/23/17  Yes Derek Jack, MD  furosemide (LASIX) 40 MG tablet Take 1 tablet (40 mg total) by mouth daily. 05/23/17  Yes Derek Jack, MD  potassium chloride SA (K-DUR,KLOR-CON) 20 MEQ tablet Take 1 tablet (20 mEq total) by mouth daily. 05/23/17  Yes Derek Jack, MD  amoxicillin-clavulanate (AUGMENTIN) 875-125 MG tablet Take 1 tablet by mouth 2 (two) times daily.  06/23/17 06/30/17  [provider]  lisinopril (PRINIVIL,ZESTRIL) 5 MG  tablet Take 5 mg by mouth daily.  06/14/17 07/14/17  [provider]    Family History Family History  Problem Relation Age of Onset  . Stroke Father   . Cirrhosis Mother   . Colon cancer Neg Hx     Social History Social History   Tobacco Use  . Smoking status: Current Some Day Smoker    Packs/day: 0.10    Years: 40.00    Pack years: 4.00    Types: Cigarettes  . Smokeless tobacco: Never Used  Substance Use Topics  . Alcohol use: Not Currently    Frequency: Never  . Drug use:  Never     Allergies   Patient has no known allergies.   Review of Systems Review of Systems  Constitutional: Positive for activity change.  Respiratory: Negative for shortness of breath.   Cardiovascular: Negative for chest pain and palpitations.  Gastrointestinal: Negative for diarrhea and vomiting.  Neurological: Positive for dizziness and syncope.     Physical Exam Updated Vital Signs BP (!) 178/80 (BP Location: Right Arm)   Pulse 84   Temp 98.2 F (36.8 C) (Oral)   Resp 20   Ht 6' (1.829 m)   Wt 86 kg (189 lb 9.5 oz)   SpO2 99%   BMI 25.71 kg/m   Physical Exam  Constitutional: He is oriented to person, place, and time. He appears well-developed.  HENT:  Head: Atraumatic.  Eyes: EOM are normal.  Neck: Neck supple.  Cardiovascular: Normal rate and regular rhythm.  Pulmonary/Chest: Effort normal. He has no rales.  Musculoskeletal: He exhibits no edema or tenderness.  Neurological: He is alert and oriented to person, place, and time.  Skin: Skin is warm.  Nursing note and vitals reviewed.    ED Treatments / Results  Labs (all labs ordered are listed, but only abnormal results are displayed) Labs Reviewed  CBC WITH DIFFERENTIAL/PLATELET - Abnormal; Notable for the following components:      Result Value   RBC 3.08 (*)    Hemoglobin 7.8 (*)    HCT 25.4 (*)    MCH 25.3 (*)    RDW 17.2 (*)    Platelets 470 (*)    Monocytes Absolute 1.2 (*)    All other components within normal limits  RETICULOCYTES - Abnormal; Notable for the following components:   RBC. 3.06 (*)    All other components within normal limits  COMPREHENSIVE METABOLIC PANEL - Abnormal; Notable for the following components:   Potassium 3.4 (*)    Creatinine, Ser 1.52 (*)    Total Protein 6.2 (*)    Albumin 3.2 (*)    GFR calc non Af Amer 47 (*)    GFR calc Af Amer 55 (*)    All other components within normal limits  I-STAT CHEM 8, ED - Abnormal; Notable for the following components:    Potassium 3.2 (*)    Creatinine, Ser 1.50 (*)    Hemoglobin 7.8 (*)    HCT 23.0 (*)    All other components within normal limits  VITAMIN B12  FOLATE  IRON AND TIBC  FERRITIN  CBC  BASIC METABOLIC PANEL  POC OCCULT BLOOD, ED  TYPE AND SCREEN  PREPARE RBC (CROSSMATCH)  ABO/RH    EKG EKG Interpretation  Date/Time:  Saturday Jun 24 2017 18:36:32 EDT Ventricular Rate:  69 PR Interval:    QRS Duration: 116 QT Interval:  433 QTC Calculation: 464 R Axis:   86 Text Interpretation:  Ectopic atrial  rhythm Nonspecific intraventricular conduction delay Abnrm T, consider ischemia, anterolateral lds persistent twi in the lateral leads Confirmed by Varney Biles [id number]) on 06/24/2017 7:12:37 PM   Radiology No results found.  Procedures Procedures (including critical care time)  CRITICAL CARE Performed by: Arian Mcquitty   Total critical care time: 41 minutes  Critical care time was exclusive of separately billable procedures and treating other patients.  Critical care was necessary to treat or prevent imminent or life-threatening deterioration.  Critical care was time spent personally by me on the following activities: development of treatment plan with patient and/or surrogate as well as nursing, discussions with consultants, evaluation of patient's response to treatment, examination of patient, obtaining history from patient or surrogate, ordering and performing treatments and interventions, ordering and review of laboratory studies, ordering and review of radiographic studies, pulse oximetry and re-evaluation of patient's condition.   Medications Ordered in ED Medications  0.9 %  sodium chloride infusion (has no administration in time range)  atorvastatin (LIPITOR) tablet 40 mg (has no administration in time range)  carvedilol (COREG) tablet 6.25 mg (has no administration in time range)  lisinopril (PRINIVIL,ZESTRIL) tablet 5 mg (has no administration in time range)    potassium chloride SA (K-DUR,KLOR-CON) CR tablet 20 mEq (has no administration in time range)  aspirin chewable tablet 81 mg (has no administration in time range)  amoxicillin-clavulanate (AUGMENTIN) 875-125 MG per tablet 1 tablet (1 tablet Oral Given 06/24/17 2353)  acetaminophen (TYLENOL) tablet 650 mg (has no administration in time range)    Or  acetaminophen (TYLENOL) suppository 650 mg (has no administration in time range)  enoxaparin (LOVENOX) injection 40 mg (40 mg Subcutaneous Not Given 06/24/17 2315)  0.9 % NaCl with KCl 40 mEq / L  infusion (75 mL/hr Intravenous New Bag/Given 06/24/17 2316)  ondansetron (ZOFRAN) tablet 4 mg (has no administration in time range)    Or  ondansetron (ZOFRAN) injection 4 mg (has no administration in time range)  lactated ringers bolus 1,000 mL (0 mLs Intravenous Stopped 06/24/17 2000)  lactated ringers bolus 500 mL (0 mLs Intravenous Stopped 06/24/17 2139)  ferumoxytol (FERAHEME) 510 mg in sodium chloride 0.9 % 100 mL IVPB (510 mg Intravenous New Bag/Given 06/24/17 2353)     Initial Impression / Assessment and Plan / ED Course  I have reviewed the triage vital signs and the nursing notes.  Pertinent labs & imaging results that were available during my care of the patient were reviewed by me and considered in my medical decision making (see chart for details).  Clinical Course as of Jun 26 30  Sat Jun 24, 2017  2104 Hb is lower than usual. Pt's Cr has gone up by 50%. He also has new TWI in the inferior leads - might be best to admit as an obs. Pt made aware.  Hemoglobin(!): 7.8 [AN]  2104 Colonoscopy reviewed from 05/31/2017 - no diverticulosis internal hemorrhoids.   [AN]    Clinical Course User Index [AN] Varney Biles, MD    DDx includes: Orthostatic hypotension Vertebral artery dissection/stenosis Dysrhythmia PE Vasovagal/neurocardiogenic syncope Aortic stenosis Valvular disorder/Cardiomyopathy Anemia  62 year old male comes in a  chief complaint of fainting. Patient had 3 episodes of fainting prior to ED arrival, last one was witnessed by the paramedics when they try to get him up.  Patient reports that all of his episodes are preceded by him trying to get up, when he starts feeling dizzy followed by diaphoresis and fainting. Patient was orthostatic positive for the EMS  and he is orthostatic positive here as well.  More importantly, patient gets symptomatic every time he is gotten by a medical professional -therefore we think that patient is having orthostasis.  Patient symptoms are likely contributed by his new lisinopril. We will advise him to stop taking lisinopril for now and to see his PCP prior to restarting his meds. We will check basic electrolytes and keep the patient on cardiac monitor. EKG of the heart does show new T wave inversions in the lateral leads, therefore he was advised that he follows up with outpatient cardiology.  Clinically, this episode of syncope appears orthostatic and not cardiac in origin -therefore I do not think patient needs to be admitted for his syncope despite him having history of CHF and new T wave inversions in the lateral leads.   Final Clinical Impressions(s) / ED Diagnoses   Final diagnoses:  Orthostatic syncope  AKI (acute kidney injury) ([place])  Nonspecific abnormal electrocardiogram (ECG) (EKG)  Severe anemia  Medication side effect, initial encounter    ED Discharge Orders    None       Varney Biles, MD 06/25/17 [phone]

## 2017-06-24 NOTE — H&P (Signed)
History and Physical  Michael [name] SEG:[id number] DOB: 1955-06-25 DOA: 06/24/2017  Referring physician: Dr Kathrynn Humble, ED physician PCP: Patient, No Pcp Per  Outpatient Specialists:   Patient Coming From: home  Chief Complaint: syncope  HPI: Michael [name] is a 62 y.o. male with a history of right facial basal cell carcinoma status post resection with right partial maxillectomy and partial rhinectomy with skin graft on 06/13/2017, hypertension, chronic iron deficient anemia, stage II chronic kidney disease, CHF with EF of 50 to 55% with grade 1 diastolic dysfunction on echocardiogram 03/2017.  Patient was just started lisinopril and took first dose this morning.  This evening, the patient had 3 syncopal episodes preceded by lightheadedness, dizziness, tunnel vision after standing up.  The patient is already on carvedilol and Lasix.  These doses remain the same.  No other palliating or provoking factors.  Symptoms are self-limiting.  No tonic-clonic activity.  Denies headache, blurred vision, weakness, tinnitus, hearing loss.  Denies hematuria, hematochezia, blood red blood per rectum.  Emergency Department Course: EKG shows new inverted T waves in the lateral leads.  Hemoglobin 7.8 which is down from his baseline of 9-10.  Review of Systems:   Pt denies any fevers, chills, nausea, vomiting, diarrhea, constipation, abdominal pain, shortness of breath, dyspnea on exertion, orthopnea, cough, wheezing, palpitations, headache, vision changes, lightheadedness, dizziness, melena, rectal bleeding.  Review of systems are otherwise negative  Past Medical History:  Diagnosis Date  . CKD (chronic kidney disease)   . Head and neck cancer ([place]) 2019  . HTN (hypertension)    Past Surgical History:  Procedure Laterality Date  . ANKLE CLOSED REDUCTION Right   . COLONOSCOPY N/A 05/31/2017   Procedure: COLONOSCOPY;  Surgeon: Daneil Dolin, MD;  [facility];  Service: Endoscopy;  Laterality: N/A;   2:45pm  . POLYPECTOMY  05/31/2017   Procedure: POLYPECTOMY;  Surgeon: Daneil Dolin, MD;  [facility];  Service: Endoscopy;;  . TONSILLECTOMY     Social History:  reports that he has been smoking cigarettes.  He has a 4.00 pack-year smoking history. He has never used smokeless tobacco. He reports that he drank alcohol. He reports that he does not use drugs. Patient lives at home  No Known Allergies  Family History  Problem Relation Age of Onset  . Stroke Father   . Cirrhosis Mother   . Colon cancer Neg Hx       Prior to Admission medications   Medication Sig Start Date End Date Taking? Authorizing Provider  aspirin 81 MG chewable tablet Chew 1 tablet (81 mg total) by mouth daily. 04/07/17  Yes Mikhail, Velta Addison, DO  atorvastatin (LIPITOR) 40 MG tablet Take 1 tablet (40 mg total) by mouth daily at 6 PM. 05/23/17  Yes Derek Jack, MD  carvedilol (COREG) 12.5 MG tablet Take 1 tablet (12.5 mg total) by mouth 2 (two) times daily with a meal. 05/23/17  Yes Derek Jack, MD  ferrous sulfate 325 (65 FE) MG tablet Take 1 tablet (325 mg total) by mouth 2 (two) times daily with a meal. 05/23/17  Yes Derek Jack, MD  furosemide (LASIX) 40 MG tablet Take 1 tablet (40 mg total) by mouth daily. 05/23/17  Yes Derek Jack, MD  potassium chloride SA (K-DUR,KLOR-CON) 20 MEQ tablet Take 1 tablet (20 mEq total) by mouth daily. 05/23/17  Yes Derek Jack, MD  amoxicillin-clavulanate (AUGMENTIN) 875-125 MG tablet Take 1 tablet by mouth 2 (two) times daily.  06/23/17 06/30/17  [provider]  lisinopril (PRINIVIL,ZESTRIL) 5 MG tablet Take 5 mg by mouth daily.  06/14/17 07/14/17  [provider]    Physical Exam: BP (!) 152/65   Pulse 79   Temp 98.2 F (36.8 C) (Oral)   Resp 19   Ht 6' (1.829 m)   Wt 85.3 kg (188 lb)   SpO2 99%   BMI 25.50 kg/m   . General: Elderly male. Awake and alert and oriented x3. No acute cardiopulmonary distress.    Marland Kitchen HEENT: Normocephalic atraumatic.  Right and left ears normal in appearance.  Pupils equal, round, reactive to light. Extraocular muscles are intact. Sclerae anicteric and noninjected.  Moist mucosal membranes. No mucosal lesions.  Large healing skin flap on right cheek and extending to nose. . Neck: Neck supple without lymphadenopathy. No carotid bruits. No masses palpated.  . Cardiovascular: Regular rate with normal S1-S2 sounds.  2 out of 6 holosystolic murmur heard best over aorta and radiating into right carotid.  No rubs, gallops auscultated. No JVD.  Marland Kitchen Respiratory: Good respiratory effort with no wheezes, rales, rhonchi. Lungs clear to auscultation bilaterally.  No accessory muscle use. . Abdomen: Soft, nontender, nondistended. Active bowel sounds. No masses or hepatosplenomegaly  . Skin: No rashes, lesions, or ulcerations.  Dry, warm to touch. 2+ dorsalis pedis and radial pulses. . Musculoskeletal: No calf or leg pain. All major joints not erythematous nontender.  No upper or lower joint deformation.  Good ROM.  No contractures  . Psychiatric: Intact judgment and insight. Pleasant and cooperative. . Neurologic: No focal neurological deficits. Strength is 5/5 and symmetric in upper and lower extremities.  Cranial nerves II through XII are grossly intact.           Labs on Admission: I have personally reviewed following labs and imaging studies  CBC: Recent Labs  Lab 06/24/17 1940 06/24/17 1955  WBC 10.0  --   NEUTROABS 6.9  --   HGB 7.8* 7.8*  HCT 25.4* 23.0*  MCV 82.5  --   PLT 470*  --    Basic Metabolic Panel: Recent Labs  Lab 06/24/17 1955  NA 142  K 3.2*  CL 104  GLUCOSE 95  BUN 16  CREATININE 1.50*   GFR: Estimated Creatinine Clearance: 55.3 mL/min (A) (by C-G formula based on SCr of 1.5 mg/dL (H)). Liver Function Tests: No results for input(s): AST, ALT, ALKPHOS, BILITOT, PROT, ALBUMIN in the last 168 hours. No results for input(s): LIPASE, AMYLASE in the  last 168 hours. No results for input(s): AMMONIA in the last 168 hours. Coagulation Profile: No results for input(s): INR, PROTIME in the last 168 hours. Cardiac Enzymes: No results for input(s): CKTOTAL, CKMB, CKMBINDEX, TROPONINI in the last 168 hours. BNP (last 3 results) No results for input(s): PROBNP in the last 8760 hours. HbA1C: No results for input(s): HGBA1C in the last 72 hours. CBG: No results for input(s): GLUCAP in the last 168 hours. Lipid Profile: No results for input(s): CHOL, HDL, LDLCALC, TRIG, CHOLHDL, LDLDIRECT in the last 72 hours. Thyroid Function Tests: No results for input(s): TSH, T4TOTAL, FREET4, T3FREE, THYROIDAB in the last 72 hours. Anemia Panel: No results for input(s): VITAMINB12, FOLATE, FERRITIN, TIBC, IRON, RETICCTPCT in the last 72 hours. Urine analysis:    Component Value Date/Time   COLORURINE YELLOW 04/02/2017 0431   APPEARANCEUR CLOUDY (A) 04/02/2017 0431   APPEARANCEUR Cloudy 04/23/2011 1044   LABSPEC 1.012 04/02/2017 0431   LABSPEC 1.018 04/23/2011 1044   PHURINE 8.0 04/02/2017 0431  GLUCOSEU NEGATIVE 04/02/2017 0431   GLUCOSEU Negative 04/23/2011 1044   HGBUR SMALL (A) 04/02/2017 0431   BILIRUBINUR NEGATIVE 04/02/2017 0431   BILIRUBINUR Negative 04/23/2011 1044   KETONESUR NEGATIVE 04/02/2017 0431   PROTEINUR NEGATIVE 04/02/2017 0431   NITRITE POSITIVE (A) 04/02/2017 0431   LEUKOCYTESUR TRACE (A) 04/02/2017 0431   LEUKOCYTESUR Trace 04/23/2011 1044   Sepsis Labs: @LABRCNTIP (procalcitonin:4,lacticidven:4) )No results found for this or any previous visit (from the past 240 hour(s)).   Radiological Exams on Admission: No results found.  EKG: Independently reviewed.  Sinus rhythm with inferior T waves in the lateral leads that are new compared to previous  Assessment/Plan: Principal Problem:   Syncope due to orthostatic hypotension Active Problems:   Essential hypertension   CHF (congestive heart failure) (HCC)   Iron (Fe)  deficiency anemia   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   Heart murmur   Abnormal EKG    This patient was discussed with the ED physician, including pertinent vitals, physical exam findings, labs, and imaging.  We also discussed care given by the ED provider.  1. Syncope due to orthostasis a. Observation b. Likely combination of anemia and addition of lisinopril c. We will decrease carvedilol and continue lisinopril d. Echocardiogram e. Carotid ultrasound f. Continuous telemetry g.  2. Iron deficient anemia a. Uncertain as to why his anemia is worse, other than underlying kidney disease and poor iron absorption b. Will transfuse 1 unit c. Iron infusion d. Repeat CBC in the morning 3. Heart murmur a. Echocardiogram in the morning 4. Abnormal EKG a. Echocardiogram in the morning b. No chest pain in the patient's and no ST changes 5. CHF a. Compensated 6. Hypertension a. Antihypertensives adjusted 7. Basal cell carcinoma a. Status post resection and skin grafting which appears well-healed  DVT prophylaxis: Lovenox Consultants: None Code Status: Full code Family Communication: None Disposition Plan: Patient should be able to return home following improvement of orthostasis and after work-up   Truett Mainland, DO [facility] Pager [phone]  If 7PM-7AM, please contact night-coverage [email or web address] Password [email or web address]

## 2017-06-24 NOTE — ED Triage Notes (Signed)
Neighbors called ambulance for patient who reported dizziness today. Fall today on front porch, skin tear on right arm.  Recent skin graft last Tuesday from left arm, left leg to face.

## 2017-06-24 NOTE — ED Notes (Signed)
Patient ambulate with stand by assistance through room and around nurses station once.  No complaints of syncope.

## 2017-06-25 ENCOUNTER — Observation Stay (HOSPITAL_COMMUNITY): Payer: Medicaid Other

## 2017-06-25 ENCOUNTER — Encounter (HOSPITAL_COMMUNITY): Payer: Self-pay

## 2017-06-25 DIAGNOSIS — I779 Disorder of arteries and arterioles, unspecified: Secondary | ICD-10-CM

## 2017-06-25 DIAGNOSIS — D649 Anemia, unspecified: Secondary | ICD-10-CM | POA: Diagnosis not present

## 2017-06-25 DIAGNOSIS — N179 Acute kidney failure, unspecified: Secondary | ICD-10-CM

## 2017-06-25 DIAGNOSIS — I951 Orthostatic hypotension: Secondary | ICD-10-CM

## 2017-06-25 DIAGNOSIS — C44311 Basal cell carcinoma of skin of nose: Secondary | ICD-10-CM | POA: Diagnosis not present

## 2017-06-25 DIAGNOSIS — R9431 Abnormal electrocardiogram [ECG] [EKG]: Secondary | ICD-10-CM | POA: Diagnosis not present

## 2017-06-25 DIAGNOSIS — I739 Peripheral vascular disease, unspecified: Secondary | ICD-10-CM

## 2017-06-25 LAB — CBC
HEMATOCRIT: 27.2 % — AB (ref 39.0–52.0)
Hemoglobin: 8.4 g/dL — ABNORMAL LOW (ref 13.0–17.0)
MCH: 25.6 pg — AB (ref 26.0–34.0)
MCHC: 30.9 g/dL (ref 30.0–36.0)
MCV: 82.9 fL (ref 78.0–100.0)
Platelets: 421 10*3/uL — ABNORMAL HIGH (ref 150–400)
RBC: 3.28 MIL/uL — ABNORMAL LOW (ref 4.22–5.81)
RDW: 16.6 % — AB (ref 11.5–15.5)
WBC: 8.2 10*3/uL (ref 4.0–10.5)

## 2017-06-25 LAB — BASIC METABOLIC PANEL
Anion gap: 8 (ref 5–15)
BUN: 17 mg/dL (ref 6–20)
CALCIUM: 8.7 mg/dL — AB (ref 8.9–10.3)
CO2: 26 mmol/L (ref 22–32)
CREATININE: 1.23 mg/dL (ref 0.61–1.24)
Chloride: 105 mmol/L (ref 101–111)
GFR calc Af Amer: >60 mL/min (ref >60)
GFR calc non Af Amer: >60 mL/min (ref >60)
GLUCOSE: 89 mg/dL (ref 65–99)
Potassium: 3 mmol/L — ABNORMAL LOW (ref 3.5–5.1)
Sodium: 139 mmol/L (ref 135–145)

## 2017-06-25 LAB — FOLATE: FOLATE: 7.7 ng/mL (ref >5.9)

## 2017-06-25 LAB — IRON AND TIBC
Iron: 16 ug/dL — ABNORMAL LOW (ref 45–182)
SATURATION RATIOS: 5 % — AB (ref 17.9–39.5)
TIBC: 339 ug/dL (ref 250–450)
UIBC: 323 ug/dL

## 2017-06-25 LAB — VITAMIN B12: VITAMIN B 12: 285 pg/mL (ref 180–914)

## 2017-06-25 LAB — FERRITIN: Ferritin: 24 ng/mL (ref 24–336)

## 2017-06-25 MED ORDER — FUROSEMIDE 40 MG PO TABS: 40.0000 mg | ORAL_TABLET | Freq: Every day | ORAL | 2 refills | Status: DC

## 2017-06-25 MED ORDER — POTASSIUM CHLORIDE CRYS ER 20 MEQ PO TBCR: 40.0000 meq | EXTENDED_RELEASE_TABLET | Freq: Once | ORAL | Status: DC

## 2017-06-25 MED ORDER — CYANOCOBALAMIN 1000 MCG/ML IJ SOLN
1000.0000 ug | Freq: Once | INTRAMUSCULAR | Status: AC
  Administered 2017-06-25: 1000 ug via SUBCUTANEOUS
  Filled 2017-06-25: qty 1

## 2017-06-25 NOTE — Discharge Instructions (Signed)
Orthostatic Hypotension Orthostatic hypotension is a sudden drop in blood pressure that happens when you quickly change positions, such as when you get up from a seated or lying position. Blood pressure is a measurement of how strongly, or weakly, your blood is pressing against the walls of your arteries. Arteries are blood vessels that carry blood from your heart throughout your body. When blood pressure is too low, you may not get enough blood to your brain or to the rest of your organs. This can cause weakness, light-headedness, rapid heartbeat, and fainting. This can last for just a few seconds or for up to a few minutes. Orthostatic hypotension is usually not a serious problem. However, if it happens frequently or gets worse, it may be a sign of something more serious. What are the causes? This condition may be caused by:  Sudden changes in posture, such as standing up quickly after you have been sitting or lying down.  Blood loss.  Loss of body fluids (dehydration).  Heart problems.  Hormone (endocrine) problems.  Pregnancy.  Severe infection.  Lack of certain nutrients.  Severe allergic reactions (anaphylaxis).  Certain medicines, such as blood pressure medicine or medicines that make the body lose excess fluids (diuretics). Sometimes, this condition can be caused by not taking medicine as directed, such as taking too much of a certain medicine.  What increases the risk? Certain factors can make you more likely to develop orthostatic hypotension, including:  Age. Risk increases as you get older.  Conditions that affect the heart or the central nervous system.  Taking certain medicines, such as blood pressure medicine or diuretics.  Being pregnant.  What are the signs or symptoms? Symptoms of this condition may include:  Weakness.  Light-headedness.  Dizziness.  Blurred vision.  Fatigue.  Rapid heartbeat.  Fainting, in severe cases.  How is this  diagnosed? This condition is diagnosed based on:  Your medical history.  Your symptoms.  Your blood pressure measurement. Your health care provider will check your blood pressure when you are: ? Lying down. ? Sitting. ? Standing.  A blood pressure reading is recorded as two numbers, such as "120 over 80" (or 120/80). The first ("top") number is called the systolic pressure. It is a measure of the pressure in your arteries as your heart beats. The second ("bottom") number is called the diastolic pressure. It is a measure of the pressure in your arteries when your heart relaxes between beats. Blood pressure is measured in a unit called mm Hg. Healthy blood pressure for adults is 120/80. If your blood pressure is below 90/60, you may be diagnosed with hypotension. Other information or tests that may be used to diagnose orthostatic hypotension include:  Your other vital signs, such as your heart rate and temperature.  Blood tests.  Tilt table test. For this test, you will be safely secured to a table that moves you from a lying position to an upright position. Your heart rhythm and blood pressure will be monitored during the test.  How is this treated? Treatment for this condition may include:  Changing your diet. This may involve eating more salt (sodium) or drinking more water.  Taking medicines to raise your blood pressure.  Changing the dosage of certain medicines you are taking that might be lowering your blood pressure.  Wearing compression stockings. These stockings help to prevent blood clots and reduce swelling in your legs.  In some cases, you may need to go to the hospital for:    Fluid replacement. This means you will receive fluids through an IV tube.  Blood replacement. This means you will receive donated blood through an IV tube (transfusion).  Treating an infection or heart problems, if this applies.  Monitoring. You may need to be monitored while medicines that you  are taking wear off.  Follow these instructions at home: Eating and drinking   Drink enough fluid to keep your urine clear or pale yellow.  Eat a healthy diet and follow instructions from your health care provider about eating or drinking restrictions. A healthy diet includes: ? Fresh fruits and vegetables. ? Whole grains. ? Lean meats. ? Low-fat dairy products.  Eat extra salt only as directed. Do not add extra salt to your diet unless your health care provider told you to do that.  Eat frequent, small meals.  Avoid standing up suddenly after eating. Medicines  Take over-the-counter and prescription medicines only as told by your health care provider. ? Follow instructions from your health care provider about changing the dosage of your current medicines, if this applies. ? Do not stop or adjust any of your medicines on your own. General instructions  Wear compression stockings as told by your health care provider.  Get up slowly from lying down or sitting positions. This gives your blood pressure a chance to adjust.  Avoid hot showers and excessive heat as directed by your health care provider.  Return to your normal activities as told by your health care provider. Ask your health care provider what activities are safe for you.  Do not use any products that contain nicotine or tobacco, such as cigarettes and e-cigarettes. If you need help quitting, ask your health care provider.  Keep all follow-up visits as told by your health care provider. This is important. Contact a health care provider if:  You vomit.  You have diarrhea.  You have a fever for more than 2-3 days.  You feel more thirsty than usual.  You feel weak and tired. Get help right away if:  You have chest pain.  You have a fast or irregular heartbeat.  You develop numbness in any part of your body.  You cannot move your arms or your legs.  You have trouble speaking.  You become sweaty or feel  lightheaded.  You faint.  You feel short of breath.  You have trouble staying awake.  You feel confused. This information is not intended to replace advice given to you by your health care provider. Make sure you discuss any questions you have with your health care provider. Document Released: 01/07/2002 Document Revised: 10/06/2015 Document Reviewed: 07/10/2015 Elsevier Interactive Patient Education  2018 [employer]  

## 2017-06-25 NOTE — Progress Notes (Signed)
Michael [name] discharged Home per MD order.  Discharge instructions reviewed and discussed with the patient, all questions and concerns answered. Copy of instructions and scripts given to patient.  Allergies as of 06/25/2017   No Known Allergies     Medication List    STOP taking these medications   lisinopril 5 MG tablet Commonly known as:  PRINIVIL,ZESTRIL     TAKE these medications   aspirin 81 MG chewable tablet Chew 1 tablet (81 mg total) by mouth daily.   atorvastatin 40 MG tablet Commonly known as:  LIPITOR Take 1 tablet (40 mg total) by mouth daily at 6 PM.   AUGMENTIN 875-125 MG tablet Generic drug:  amoxicillin-clavulanate Take 1 tablet by mouth 2 (two) times daily.   carvedilol 12.5 MG tablet Commonly known as:  COREG Take 1 tablet (12.5 mg total) by mouth 2 (two) times daily with a meal.   ferrous sulfate 325 (65 FE) MG tablet Take 1 tablet (325 mg total) by mouth 2 (two) times daily with a meal.   furosemide 40 MG tablet Commonly known as:  LASIX Take 1 tablet (40 mg total) by mouth daily. Start taking on:  06/27/2017 What changed:  These instructions start on 06/27/2017. If you are unsure what to do until then, ask your doctor or other care provider.   potassium chloride SA 20 MEQ tablet Commonly known as:  K-DUR,KLOR-CON Take 1 tablet (20 mEq total) by mouth daily.        IV site discontinued and catheter remains intact. Site without signs and symptoms of complications. Dressing and pressure applied.  Patient escorted to the elevator,  no distress noted upon discharge.  Ralene Muskrat Michael [name] 06/25/2017 3:31 PM

## 2017-06-25 NOTE — Discharge Summary (Addendum)
Physician Discharge Summary  Deivi Huckins TOI:[id number] DOB: 02-Dec-1955 DOA: 06/24/2017  PCP: Patient, No Pcp Per  Admit date: 06/24/2017 Discharge date: 06/25/2017  Admitted From: Home Disposition: Home  Recommendations for Outpatient Follow-up:  1. Patient will be given information on establishing care at the [facility] in [place]. 2. Follow-up with vascular surgery and hematologist in 4 weeks.  Home Health: None Equipment/Devices: None  Discharge Condition: Fair CODE STATUS: Full code Diet recommendation: Heart Healthy    Discharge Diagnoses:  Principal Problem:    Syncope due to orthostatic hypotension   Active Problems:   Essential hypertension   Iron (Fe) deficiency anemia   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   Heart murmur   Abnormal EKG   Carotid artery disease (HCC) Hypokalemia  Brief narrative/HPI 62 year old male with right facial basal cell carcinoma status post resection with right partial maxillectomy and partial rhinectomy with skin graft on 06/13/2017, right carotid artery disease, hypertension, chronic diastolic and mild systolic CHF with EF of 83-38%, chronic iron deficiency anemia, stage II chronic kidney disease and hypertension who presented with 3 syncopal episode preceded by lightheadedness and some dizziness mainly on standing up.  He reports falling on the ground but but did not lose consciousness.  Denied any diaphoresis, palpitations, fevers, chills, nausea, vomiting, hematuria, hematochezia, abdominal pain, dysuria.  He was started on lisinopril recently for elevated blood pressure.  In the ED he was found to be orthostatic with a EKG showing inverted T waves in lateral leads.  Hemoglobin of 7.8 which was low from baseline of 9-10. Placed on observation on telemetry.  Hospital course Orthostatic syncope Likely contributed by recently started lisinopril causing hypotension in addition to patient being on Lasix and Coreg as well as  underlying anemia. Patient hydrated with IV fluids and blood pressure medications held.  Carotid ultrasound repeated and showed unchanged from study one month back.  Repeat 2D echo not needed IV was done about 2 months back. Repeat orthostatics negative.  Will discontinue lisinopril completely and resume Lasix in the next 2 days.  Continue Coreg.  Patient is asymptomatic and can be discharged home.  EKG changes with lateral T wave inversion. Possibly demand ischemia.  No chest pain symptoms throughout.  2D echo about 2 months without wall motion abnormality.  Outpatient referral to cardiology once established care.  Iron deficiency anemia Some drop in H&H from baseline.  Received IV Feraheme with improvement. Had recent colonoscopy (on 5/1) with multiple 6-8 sessile polyps in the descending colon that was removed.  Multiple polypoid mass in the hepatic flexure that was not removed but biopsied and GI recommended surgical removal in future.  The biopsies were negative for malignancy.  Continue iron supplement. Patient needs follow-up with GI as scheduled. Has appointment with hematologist on 6/3.  B12 deficiency Received B12 injection on 4/29.  Will order a dose prior to discharge.  Right internal carotid stenosis Has >80% stenosis.  Has remained asymptomatic and follows with Dr. Oneida Alar who saw him on 4/12 with plan on evaluating him for endarterectomy after his surgery for basal cell carcinoma.  He should follow with him in next 4 weeks.  Chronic diastolic/mild systolic CHF. EF of 50-55% in March.  Was hospitalized for acute CHF.  Currently hypovolemic.  Resume Lasix in 2 days.  Continue Coreg.  Basal cell carcinoma Status post resection with skin grafting recently.  On empiric antibiotics with Augmentin.  Hypokalemia Replenished.  Continue supplement.   Family communication : None at bedside  Procedure: Carotid ultrasound  Disposition: Home  Discharge Instructions   Allergies  as of 06/25/2017   No Known Allergies     Medication List    STOP taking these medications   lisinopril 5 MG tablet Commonly known as:  PRINIVIL,ZESTRIL     TAKE these medications   aspirin 81 MG chewable tablet Chew 1 tablet (81 mg total) by mouth daily.   atorvastatin 40 MG tablet Commonly known as:  LIPITOR Take 1 tablet (40 mg total) by mouth daily at 6 PM.   AUGMENTIN 875-125 MG tablet Generic drug:  amoxicillin-clavulanate Take 1 tablet by mouth 2 (two) times daily.   carvedilol 12.5 MG tablet Commonly known as:  COREG Take 1 tablet (12.5 mg total) by mouth 2 (two) times daily with a meal.   ferrous sulfate 325 (65 FE) MG tablet Take 1 tablet (325 mg total) by mouth 2 (two) times daily with a meal.   furosemide 40 MG tablet Commonly known as:  LASIX Take 1 tablet (40 mg total) by mouth daily. Start taking on:  06/27/2017 What changed:  These instructions start on 06/27/2017. If you are unsure what to do until then, ask your doctor or other care provider.   potassium chloride SA 20 MEQ tablet Commonly known as:  K-DUR,KLOR-CON Take 1 tablet (20 mEq total) by mouth daily.      Follow-up Information    Elam Dutch, MD. Schedule an appointment as soon as possible for a visit in 4 week(s).   Specialties:  Vascular Surgery, Cardiology Contact information: [place] [address] [phone]        Derek Jack, MD. Schedule an appointment as soon as possible for a visit in 3 week(s).   Specialty:  Hematology Contact information: [place] [address] [phone]          No Known Allergies    Procedures/Studies: US Carotid Bilateral  Result Date: 06/25/2017 CLINICAL DATA:  Syncope, vertigo. Hypertension, coronary artery disease, previous tobacco abuse. EXAM: BILATERAL CAROTID DUPLEX ULTRASOUND TECHNIQUE: Pearline Cables scale imaging, color Doppler and duplex ultrasound was performed of bilateral carotid and vertebral  arteries in the neck. COMPARISON:  04/04/2017 TECHNIQUE: Quantification of carotid stenosis is based on velocity parameters that correlate the residual internal carotid diameter with NASCET-based stenosis levels, using the diameter of the distal internal carotid lumen as the denominator for stenosis measurement. The following velocity measurements were obtained: PEAK SYSTOLIC/END DIASTOLIC RIGHT ICA:                     337/91cm/sec CCA:                     41/66AY/TKZ SYSTOLIC ICA/CCA RATIO:  3.5 ECA:                     309cm/sec LEFT ICA:                     135/40cm/sec CCA:                     601/09NA/TFT SYSTOLIC ICA/CCA RATIO:  1.1 ECA:                     171cm/sec FINDINGS: RIGHT CAROTID ARTERY: Eccentric calcified plaque in the bulb extending into proximal internal external carotid arteries. Elevated peak systolic velocities in the proximal ICA just distal to the plaque. Tortuous carotid system. Elsewhere normal  waveforms and color Doppler signal. RIGHT VERTEBRAL ARTERY:  Normal flow direction and waveform. LEFT CAROTID ARTERY: Eccentric plaque in the distal common carotid artery, bulb, proximal internal and external carotid arteries. This results in at least mild proximal ICA stenosis. Normal waveforms and color Doppler signal. Mild tortuosity. LEFT VERTEBRAL ARTERY: Normal flow direction and waveform. IMPRESSION: 1. Bilateral carotid bifurcation and proximal ICA plaque resulting in 70-99% diameter right ICA stenosis, less than 50% diameter left ICA stenosis. Little change from previous study. 2.  Antegrade bilateral vertebral arterial flow. Electronically Signed   By: Lucrezia Europe M.D.   On: 06/25/2017 12:22      Subjective: Denies further dizziness or lightheadedness.  Stable on monitor.  Discharge Exam: Vitals:   06/25/17 1017 06/25/17 1315  BP: (!) 154/70 (!) 172/71  Pulse: 76 71  Resp:  16  Temp:  97.7 F (36.5 C)  SpO2:  100%   Vitals:   06/25/17 1013 06/25/17 1015 06/25/17 1017  06/25/17 1315  BP: (!) 163/74 (!) 149/100 (!) 154/70 (!) 172/71  Pulse: 68 70 76 71  Resp:    16  Temp:    97.7 F (36.5 C)  TempSrc:    Oral  SpO2:    100%  Weight:      Height:        General: Not in distress HEENT: Pallor present, no icterus, basal cell carcinoma of the right face with recent surgical flap Chest: Clear bilaterally CVS: S1 and S2 regular, 3/6 systolic murmur GI: Soft, nondistended, nontender Muscular skeletal: Warm, no edema     The results of significant diagnostics from this hospitalization (including imaging, microbiology, ancillary and laboratory) are listed below for reference.     Microbiology: No results found for this or any previous visit (from the past 240 hour(s)).   Labs: BNP (last 3 results) Recent Labs    04/02/17 0000  BNP 027.2*   Basic Metabolic Panel: Recent Labs  Lab 06/24/17 1940 06/24/17 1955 06/25/17 0621  NA 138 142 139  K 3.4* 3.2* 3.0*  CL 103 104 105  CO2 26  --  26  GLUCOSE 93 95 89  BUN 19 16 17   CREATININE 1.52* 1.50* 1.23  CALCIUM 9.0  --  8.7*   Liver Function Tests: Recent Labs  Lab 06/24/17 1940  AST 23  ALT 21  ALKPHOS 76  BILITOT 0.6  PROT 6.2*  ALBUMIN 3.2*   No results for input(s): LIPASE, AMYLASE in the last 168 hours. No results for input(s): AMMONIA in the last 168 hours. CBC: Recent Labs  Lab 06/24/17 1940 06/24/17 1955 06/25/17 0621  WBC 10.0  --  8.2  NEUTROABS 6.9  --   --   HGB 7.8* 7.8* 8.4*  HCT 25.4* 23.0* 27.2*  MCV 82.5  --  82.9  PLT 470*  --  421*   Cardiac Enzymes: No results for input(s): CKTOTAL, CKMB, CKMBINDEX, TROPONINI in the last 168 hours. BNP: Invalid input(s): POCBNP CBG: No results for input(s): GLUCAP in the last 168 hours. D-Dimer No results for input(s): DDIMER in the last 72 hours. Hgb A1c No results for input(s): HGBA1C in the last 72 hours. Lipid Profile No results for input(s): CHOL, HDL, LDLCALC, TRIG, CHOLHDL, LDLDIRECT in the last 72  hours. Thyroid function studies No results for input(s): TSH, T4TOTAL, T3FREE, THYROIDAB in the last 72 hours.  Invalid input(s): FREET3 Anemia work up Recent Labs    06/24/17 1940 06/24/17 2130  [place]  --  285  FOLATE 7.7  --   FERRITIN  --  24  TIBC  --  339  IRON  --  16*  RETICCTPCT 0.9  --    Urinalysis    Component Value Date/Time   COLORURINE YELLOW 04/02/2017 0431   APPEARANCEUR CLOUDY (A) 04/02/2017 0431   APPEARANCEUR Cloudy 04/23/2011 1044   LABSPEC 1.012 04/02/2017 0431   LABSPEC 1.018 04/23/2011 1044   PHURINE 8.0 04/02/2017 0431   GLUCOSEU NEGATIVE 04/02/2017 0431   GLUCOSEU Negative 04/23/2011 1044   HGBUR SMALL (A) 04/02/2017 0431   BILIRUBINUR NEGATIVE 04/02/2017 0431   BILIRUBINUR Negative 04/23/2011 1044   KETONESUR NEGATIVE 04/02/2017 0431   PROTEINUR NEGATIVE 04/02/2017 0431   NITRITE POSITIVE (A) 04/02/2017 0431   LEUKOCYTESUR TRACE (A) 04/02/2017 0431   LEUKOCYTESUR Trace 04/23/2011 1044   Sepsis Labs Invalid input(s): PROCALCITONIN,  WBC,  LACTICIDVEN Microbiology No results found for this or any previous visit (from the past 240 hour(s)).   Time coordinating discharge: <30 minutes  SIGNED:   Louellen Molder, MD  [facility] 06/25/2017, 2:15 PM Pager   If 7PM-7AM, please contact night-coverage [email or web address] Password [email or web address]

## 2017-06-26 ENCOUNTER — Encounter (HOSPITAL_COMMUNITY): Payer: Self-pay | Admitting: Emergency Medicine

## 2017-06-26 ENCOUNTER — Emergency Department (HOSPITAL_COMMUNITY)
Admission: EM | Admit: 2017-06-26 | Discharge: 2017-06-26 | Disposition: A | Discharge status: Home / Self Care | Payer: Medicaid Other | Attending: Emergency Medicine | Admitting: Emergency Medicine

## 2017-06-26 DIAGNOSIS — Z79899 Other long term (current) drug therapy: Secondary | ICD-10-CM | POA: Insufficient documentation

## 2017-06-26 DIAGNOSIS — N189 Chronic kidney disease, unspecified: Secondary | ICD-10-CM | POA: Diagnosis not present

## 2017-06-26 DIAGNOSIS — I509 Heart failure, unspecified: Secondary | ICD-10-CM | POA: Diagnosis not present

## 2017-06-26 DIAGNOSIS — R339 Retention of urine, unspecified: Secondary | ICD-10-CM | POA: Diagnosis present

## 2017-06-26 DIAGNOSIS — I130 Hypertensive heart and chronic kidney disease with heart failure and stage 1 through stage 4 chronic kidney disease, or unspecified chronic kidney disease: Secondary | ICD-10-CM | POA: Insufficient documentation

## 2017-06-26 DIAGNOSIS — N3090 Cystitis, unspecified without hematuria: Secondary | ICD-10-CM | POA: Insufficient documentation

## 2017-06-26 DIAGNOSIS — F17210 Nicotine dependence, cigarettes, uncomplicated: Secondary | ICD-10-CM | POA: Diagnosis not present

## 2017-06-26 DIAGNOSIS — Z7982 Long term (current) use of aspirin: Secondary | ICD-10-CM | POA: Insufficient documentation

## 2017-06-26 LAB — BPAM RBC
Blood Product Expiration Date: 201906252359
ISSUE DATE / TIME: 201905260049
Unit Type and Rh: 5100

## 2017-06-26 LAB — URINALYSIS, ROUTINE W REFLEX MICROSCOPIC
Bilirubin Urine: NEGATIVE
GLUCOSE, UA: NEGATIVE mg/dL
Ketones, ur: NEGATIVE mg/dL
Nitrite: NEGATIVE
PROTEIN: 30 mg/dL — AB
Specific Gravity, Urine: 1.011 (ref 1.005–1.030)
pH: 6 (ref 5.0–8.0)

## 2017-06-26 LAB — TYPE AND SCREEN
ABO/RH(D): O POS
ANTIBODY SCREEN: NEGATIVE
UNIT DIVISION: 0

## 2017-06-26 MED ORDER — CEPHALEXIN 500 MG PO CAPS: 500.0000 mg | ORAL_CAPSULE | Freq: Four times a day (QID) | ORAL | 0 refills | Status: DC

## 2017-06-26 NOTE — Discharge Instructions (Addendum)
Keep the Foley catheter and leg bag in place.  Make an appointment to follow-up with urology they do have appointments up here in the [place] area.  Continue your current medications.  Urinalysis today could have been consistent with urinary tract infection so take the antibiotic as directed urine culture was sent to confirm whether there is urinary tract infection or not this will be completed and probably 2 days.  In the meantime take the antibiotic.

## 2017-06-26 NOTE — ED Notes (Signed)
Bladder scan > 966

## 2017-06-26 NOTE — ED Notes (Signed)
Pt's foley catheter bag changed to leg bag, instructed pt on how to empty and close off and open to empty.

## 2017-06-26 NOTE — ED Provider Notes (Signed)
[facility] EMERGENCY DEPARTMENT Provider Note   CSN: [id number] Arrival date & time: 06/26/17  1414     History   Chief Complaint Chief Complaint  Patient presents with  . Urinary Retention    HPI Michael [name] is a 62 y.o. male.  Patient discharged from the hospital yesterday following orthostatic hypotension problems.  Patient's labs from yesterday reviewed.  Patient unable to void since about 3 in the morning.  Has not had this problem before.  Bladder scan showed excessive amount of urine.  Foley was started.  With significant relief.  Patient does not have a urologist.  Patient status post recent excision and skin grafting for right nasal labial basal cell carcinoma.  This was done at [facility], [place].     Past Medical History:  Diagnosis Date  . CHF (congestive heart failure) ([place])   . CKD (chronic kidney disease)   . Head and neck cancer ([place]) 2019  . HTN (hypertension)     Patient Active Problem List   Diagnosis Date Noted  . Carotid artery disease ([place]) 06/25/2017  . AKI (acute kidney injury) ([place])   . Orthostatic syncope   . Syncope due to orthostatic hypotension 06/24/2017  . Heart murmur 06/24/2017  . Abnormal EKG 06/24/2017  . Abnormal PET scan of colon 05/25/2017  . 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 Periapical abscess 04/04/2017  . Iron (Fe) deficiency anemia 04/04/2017  . Acute diastolic CHF (congestive heart failure) ([place]) 04/04/2017  . Essential hypertension 04/02/2017  . Anemia 04/02/2017  . Acute CHF (congestive heart failure) ([place]) 04/02/2017  . Acute hypoxemic respiratory failure ([place]) 04/02/2017  . Lactic acidosis 04/02/2017  . Tobacco abuse 04/02/2017  . CHF (congestive heart failure) ([place]) 04/02/2017  . Elevated troponin 04/02/2017  . Thrombocytosis ([place]) 04/02/2017  . Hypokalemia 04/02/2017  . Skin lesion of face   . Nasal lesion     Past Surgical History:  Procedure Laterality Date  . ANKLE CLOSED REDUCTION  Right   . COLONOSCOPY N/A 05/31/2017   Procedure: COLONOSCOPY;  Surgeon: Daneil Dolin, MD;  [facility];  Service: Endoscopy;  Laterality: N/A;  2:45pm  . POLYPECTOMY  05/31/2017   Procedure: POLYPECTOMY;  Surgeon: Daneil Dolin, MD;  Location: [facility];  Service: Endoscopy;;  . TONSILLECTOMY          Home Medications    Prior to Admission medications   Medication Sig Start Date End Date Taking? Authorizing Provider  amoxicillin-clavulanate (AUGMENTIN) 875-125 MG tablet Take 1 tablet by mouth 2 (two) times daily.  06/23/17 06/30/17  [provider]  aspirin 81 MG chewable tablet Chew 1 tablet (81 mg total) by mouth daily. 04/07/17   Mikhail, Velta Addison, DO  atorvastatin (LIPITOR) 40 MG tablet Take 1 tablet (40 mg total) by mouth daily at 6 PM. 05/23/17   Derek Jack, MD  carvedilol (COREG) 12.5 MG tablet Take 1 tablet (12.5 mg total) by mouth 2 (two) times daily with a meal. 05/23/17   Derek Jack, MD  cephALEXin (KEFLEX) 500 MG capsule Take 1 capsule (500 mg total) by mouth 4 (four) times daily. 06/26/17   Fredia Sorrow, MD  ferrous sulfate 325 (65 FE) MG tablet Take 1 tablet (325 mg total) by mouth 2 (two) times daily with a meal. 05/23/17   Derek Jack, MD  furosemide (LASIX) 40 MG tablet Take 1 tablet (40 mg total) by mouth daily. 06/27/17   Dhungel, Flonnie Overman, MD  potassium chloride SA (K-DUR,KLOR-CON)  20 MEQ tablet Take 1 tablet (20 mEq total) by mouth daily. 05/23/17   Derek Jack, MD    Family History Family History  Problem Relation Age of Onset  . Stroke Father   . Cirrhosis Mother   . Colon cancer Neg Hx     Social History Social History   Tobacco Use  . Smoking status: Current Some Day Smoker    Packs/day: 0.10    Years: 40.00    Pack years: 4.00    Types: Cigarettes  . Smokeless tobacco: Never Used  Substance Use Topics  . Alcohol use: Not Currently    Frequency: Never  . Drug use: Never     Allergies     Patient has no known allergies.   Review of Systems Review of Systems  Constitutional: Negative for fever.  HENT: Negative for congestion.   Eyes: Negative for redness.  Respiratory: Negative for shortness of breath.   Cardiovascular: Negative for chest pain.  Gastrointestinal: Positive for abdominal pain.  Genitourinary: Positive for difficulty urinating.  Musculoskeletal: Negative for back pain.  Skin: Positive for wound.  Neurological: Negative for syncope and headaches.  Hematological: Does not bruise/bleed easily.  Psychiatric/Behavioral: Negative for confusion.     Physical Exam Updated Vital Signs BP (!) 233/122 (BP Location: Right Arm)   Pulse (!) 102   Temp 98 F (36.7 C) (Oral)   Resp 20   Ht 1.829 m (6')   Wt 80.7 kg (178 lb)   SpO2 100%   BMI 24.14 kg/m   Physical Exam  Constitutional: He is oriented to person, place, and time. He appears well-developed and well-nourished. No distress.  HENT:  Head: Normocephalic and atraumatic.  Mouth/Throat: Oropharynx is clear and moist.  Skin graft to the right nasal labial area of appears to be healthy.  Eyes: Pupils are equal, round, and reactive to light. EOM are normal.  Neck: Normal range of motion. Neck supple.  Cardiovascular: Normal rate and regular rhythm.  Pulmonary/Chest: Effort normal and breath sounds normal.  Abdominal: Soft. Bowel sounds are normal. There is no tenderness.  Musculoskeletal: Normal range of motion. He exhibits no edema.  Donor site for skin graft left anterior thigh appears healthy.  Patient also has dressing on the left forearm this was not examined.  This was another graft site.  Neurological: He is alert and oriented to person, place, and time. No cranial nerve deficit or sensory deficit. He exhibits normal muscle tone. Coordination normal.  Skin: Skin is warm.  Nursing note and vitals reviewed.    ED Treatments / Results  Labs (all labs ordered are listed, but only abnormal  results are displayed) Labs Reviewed  URINALYSIS, ROUTINE W REFLEX MICROSCOPIC - Abnormal; Notable for the following components:      Result Value   APPearance HAZY (*)    Hgb urine dipstick LARGE (*)    Protein, ur 30 (*)    Leukocytes, UA TRACE (*)    RBC / HPF >50 (*)    Bacteria, UA RARE (*)    All other components within normal limits  URINE CULTURE    EKG None  Radiology US Carotid Bilateral  Result Date: 06/25/2017 CLINICAL DATA:  Syncope, vertigo. Hypertension, coronary artery disease, previous tobacco abuse. EXAM: BILATERAL CAROTID DUPLEX ULTRASOUND TECHNIQUE: Pearline Cables scale imaging, color Doppler and duplex ultrasound was performed of bilateral carotid and vertebral arteries in the neck. COMPARISON:  04/04/2017 TECHNIQUE: Quantification of carotid stenosis is based on velocity parameters that correlate the residual  internal carotid diameter with NASCET-based stenosis levels, using the diameter of the distal internal carotid lumen as the denominator for stenosis measurement. The following velocity measurements were obtained: PEAK SYSTOLIC/END DIASTOLIC RIGHT ICA:                     337/91cm/sec CCA:                     92/42AS/TMH SYSTOLIC ICA/CCA RATIO:  3.5 ECA:                     309cm/sec LEFT ICA:                     135/40cm/sec CCA:                     962/22LN/LGX SYSTOLIC ICA/CCA RATIO:  1.1 ECA:                     171cm/sec FINDINGS: RIGHT CAROTID ARTERY: Eccentric calcified plaque in the bulb extending into proximal internal external carotid arteries. Elevated peak systolic velocities in the proximal ICA just distal to the plaque. Tortuous carotid system. Elsewhere normal waveforms and color Doppler signal. RIGHT VERTEBRAL ARTERY:  Normal flow direction and waveform. LEFT CAROTID ARTERY: Eccentric plaque in the distal common carotid artery, bulb, proximal internal and external carotid arteries. This results in at least mild proximal ICA stenosis. Normal waveforms and color  Doppler signal. Mild tortuosity. LEFT VERTEBRAL ARTERY: Normal flow direction and waveform. IMPRESSION: 1. Bilateral carotid bifurcation and proximal ICA plaque resulting in 70-99% diameter right ICA stenosis, less than 50% diameter left ICA stenosis. Little change from previous study. 2.  Antegrade bilateral vertebral arterial flow. Electronically Signed   By: Lucrezia Europe M.D.   On: 06/25/2017 12:22    Procedures Procedures (including critical care time)  Medications Ordered in ED Medications - No data to display   Initial Impression / Assessment and Plan / ED Course  I have reviewed the triage vital signs and the nursing notes.  Pertinent labs & imaging results that were available during my care of the patient were reviewed by me and considered in my medical decision making (see chart for details).     Nursing started the Foley catheter based on the bladder scan.  Urinalysis and urine culture sent.  Patient with significant relief urine is pinkish in color.  Patient will be switched over to a leg bag and will follow-up with urology.  Based on the urinalysis questionable urinary tract infection patient will be started on Keflex while cultures are pending.  Final Clinical Impressions(s) / ED Diagnoses   Final diagnoses:  Urinary retention  Cystitis    ED Discharge Orders        Ordered    cephALEXin (KEFLEX) 500 MG capsule  4 times daily     06/26/17 1551       Fredia Sorrow, MD 06/26/17 1558

## 2017-06-26 NOTE — ED Notes (Signed)
Unable to obtain signature for discharge due to e-signature pad not working.  Pt verbalized understanding of prescription and follow-up

## 2017-06-26 NOTE — ED Triage Notes (Signed)
Patient states he has not been able to urinate since 0300 this morning. States he was discharged from hospital yesterday for heart failure.

## 2017-06-28 LAB — URINE CULTURE: CULTURE: NO GROWTH

## 2017-07-01 ENCOUNTER — Emergency Department (HOSPITAL_COMMUNITY)
Admission: EM | Admit: 2017-07-01 | Discharge: 2017-07-01 | Disposition: A | Discharge status: Home / Self Care | Payer: Medicaid Other | Attending: Emergency Medicine | Admitting: Emergency Medicine

## 2017-07-01 ENCOUNTER — Encounter (HOSPITAL_COMMUNITY): Payer: Self-pay | Admitting: Emergency Medicine

## 2017-07-01 DIAGNOSIS — I2510 Atherosclerotic heart disease of native coronary artery without angina pectoris: Secondary | ICD-10-CM | POA: Diagnosis not present

## 2017-07-01 DIAGNOSIS — N189 Chronic kidney disease, unspecified: Secondary | ICD-10-CM | POA: Diagnosis not present

## 2017-07-01 DIAGNOSIS — I509 Heart failure, unspecified: Secondary | ICD-10-CM | POA: Diagnosis not present

## 2017-07-01 DIAGNOSIS — R339 Retention of urine, unspecified: Secondary | ICD-10-CM | POA: Insufficient documentation

## 2017-07-01 DIAGNOSIS — F17210 Nicotine dependence, cigarettes, uncomplicated: Secondary | ICD-10-CM | POA: Insufficient documentation

## 2017-07-01 DIAGNOSIS — I130 Hypertensive heart and chronic kidney disease with heart failure and stage 1 through stage 4 chronic kidney disease, or unspecified chronic kidney disease: Secondary | ICD-10-CM | POA: Insufficient documentation

## 2017-07-01 DIAGNOSIS — Z79899 Other long term (current) drug therapy: Secondary | ICD-10-CM | POA: Insufficient documentation

## 2017-07-01 DIAGNOSIS — Z7982 Long term (current) use of aspirin: Secondary | ICD-10-CM | POA: Insufficient documentation

## 2017-07-01 NOTE — ED Triage Notes (Signed)
Pt reports being unable to urinate since having foley removed yesterday afternoon at [facility].

## 2017-07-01 NOTE — Discharge Instructions (Addendum)
Call [facility] on Monday to arrange a follow-up.  Leave the Foley catheter in place until you see the urologist.  Return to the ER for any worsening symptoms.

## 2017-07-01 NOTE — ED Provider Notes (Signed)
[facility] EMERGENCY DEPARTMENT Provider Note   CSN: [id number] Arrival date & time: 07/01/17  4967     History   Chief Complaint Chief Complaint  Patient presents with  . Urinary Retention    HPI Michael [name] is a 62 y.o. male.  HPI   Michael [name] is a 62 y.o. male who presents to the Emergency Department complaining of urinary retention since yesterday.  He was seen here on 06/26/2017 for urinary retention and had a Foley catheter placed.  He was thought to have urinary tract infection and started on Keflex.  He was seen at [facility] urology yesterday and had his Foley catheter removed.  He states initially he was urinating small amounts, but unable to void since 5 PM yesterday.  He complains of pressure and pain to his lower abdomen and feeling of fullness.  He states he was prescribed medication for his prostate, but has been unable to fill the medication yet.  He is currently status post excision and skin graft for a right sided nasolabial basal cell carcinoma is currently being followed at [facility].  He denies fever, chills, vomiting or back pain.   Past Medical History:  Diagnosis Date  . CHF (congestive heart failure) ([place])   . CKD (chronic kidney disease)   . Head and neck cancer ([place]) 2019  . HTN (hypertension)     Patient Active Problem List   Diagnosis Date Noted  . Carotid artery disease ([place]) 06/25/2017  . AKI (acute kidney injury) ([place])   . Orthostatic syncope   . Syncope due to orthostatic hypotension 06/24/2017  . Heart murmur 06/24/2017  . Abnormal EKG 06/24/2017  . Abnormal PET scan of colon 05/25/2017  . 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 Periapical abscess 04/04/2017  . Iron (Fe) deficiency anemia 04/04/2017  . Acute diastolic CHF (congestive heart failure) ([place]) 04/04/2017  . Essential hypertension 04/02/2017  . Anemia 04/02/2017  . Acute CHF (congestive heart failure) ([place]) 04/02/2017  . Acute hypoxemic respiratory  failure ([place]) 04/02/2017  . Lactic acidosis 04/02/2017  . Tobacco abuse 04/02/2017  . CHF (congestive heart failure) ([place]) 04/02/2017  . Elevated troponin 04/02/2017  . Thrombocytosis ([place]) 04/02/2017  . Hypokalemia 04/02/2017  . Skin lesion of face   . Nasal lesion     Past Surgical History:  Procedure Laterality Date  . ANKLE CLOSED REDUCTION Right   . COLONOSCOPY N/A 05/31/2017   Procedure: COLONOSCOPY;  Surgeon: Daneil Dolin, MD;  [facility];  Service: Endoscopy;  Laterality: N/A;  2:45pm  . POLYPECTOMY  05/31/2017   Procedure: POLYPECTOMY;  Surgeon: Daneil Dolin, MD;  Location: [facility];  Service: Endoscopy;;  . TONSILLECTOMY          Home Medications    Prior to Admission medications   Medication Sig Start Date End Date Taking? Authorizing Provider  aspirin 81 MG chewable tablet Chew 1 tablet (81 mg total) by mouth daily. 04/07/17   Mikhail, Velta Addison, DO  atorvastatin (LIPITOR) 40 MG tablet Take 1 tablet (40 mg total) by mouth daily at 6 PM. 05/23/17   Derek Jack, MD  carvedilol (COREG) 12.5 MG tablet Take 1 tablet (12.5 mg total) by mouth 2 (two) times daily with a meal. 05/23/17   Derek Jack, MD  cephALEXin (KEFLEX) 500 MG capsule Take 1 capsule (500 mg total) by mouth 4 (four) times daily. 06/26/17   Fredia Sorrow, MD  ferrous sulfate 325 (65 FE) MG tablet Take  1 tablet (325 mg total) by mouth 2 (two) times daily with a meal. 05/23/17   Derek Jack, MD  furosemide (LASIX) 40 MG tablet Take 1 tablet (40 mg total) by mouth daily. 06/27/17   Dhungel, Nishant, MD  potassium chloride SA (K-DUR,KLOR-CON) 20 MEQ tablet Take 1 tablet (20 mEq total) by mouth daily. 05/23/17   Derek Jack, MD    Family History Family History  Problem Relation Age of Onset  . Stroke Father   . Cirrhosis Mother   . Colon cancer Neg Hx     Social History Social History   Tobacco Use  . Smoking status: Current Some Day Smoker     Packs/day: 0.10    Years: 40.00    Pack years: 4.00    Types: Cigarettes  . Smokeless tobacco: Never Used  Substance Use Topics  . Alcohol use: Not Currently    Frequency: Never  . Drug use: Never     Allergies   Patient has no known allergies.   Review of Systems Review of Systems  Constitutional: Negative for activity change, appetite change, chills and fever.  Respiratory: Negative for chest tightness and shortness of breath.   Cardiovascular: Negative for chest pain.  Gastrointestinal: Positive for abdominal pain. Negative for nausea and vomiting.  Genitourinary: Positive for urgency. Negative for dysuria, flank pain, frequency, hematuria, penile pain and penile swelling.       Urinary retention  Musculoskeletal: Negative for back pain.  Skin: Negative for rash.  Neurological: Negative for dizziness, weakness and numbness.  Hematological: Negative for adenopathy.  Psychiatric/Behavioral: Negative for confusion.  All other systems reviewed and are negative.    Physical Exam Updated Vital Signs BP (!) 155/90 (BP Location: Left Arm)   Pulse 70   Temp 97.6 F (36.4 C)   Resp 18   Ht 6' (1.829 m)   Wt 80.7 kg (178 lb)   SpO2 98%   BMI 24.14 kg/m   Physical Exam  Constitutional: He is oriented to person, place, and time. He appears well-developed and well-nourished. No distress.  HENT:  Head: Atraumatic.  Mouth/Throat: Oropharynx is clear and moist.  Patient with skin graft to right nasolabial area.  Sutures present.  Skin warm and pink.  No drainage  Neck: Normal range of motion. Neck supple.  Cardiovascular: Normal rate and regular rhythm.  Pulmonary/Chest: Effort normal and breath sounds normal.  Abdominal: Soft. He exhibits no distension and no mass. There is no tenderness. There is no guarding.  Musculoskeletal: Normal range of motion.  Lymphadenopathy:    He has no cervical adenopathy.  Neurological: He is alert and oriented to person, place, and time.    Skin: Skin is warm. Capillary refill takes less than 2 seconds. No rash noted.  Nursing note and vitals reviewed.    ED Treatments / Results  Labs (all labs ordered are listed, but only abnormal results are displayed) Labs Reviewed - No data to display  EKG None  Radiology No results found.  Procedures Procedures (including critical care time)  Medications Ordered in ED Medications - No data to display   Initial Impression / Assessment and Plan / ED Course  I have reviewed the triage vital signs and the nursing notes.  Pertinent labs & imaging results that were available during my care of the patient were reviewed by me and considered in my medical decision making (see chart for details).     Review of medical records shows urine culture from 5 days ago  negative for growth.  Patient not currently taking antibiotic   Foley catheter inserted by nursing staff with approximately 1000 cc of yellow urine in the bag.  Patient reports much relief and denies pain at present. I have advised patient to leave the Foley in place and contact [facility] on Monday for follow-up.  Patient agrees to plan.  Return precautions discussed.   Final Clinical Impressions(s) / ED Diagnoses   Final diagnoses:  Urinary retention    ED Discharge Orders    None       Kem Parkinson, PA-C 07/01/17 0933    Francine Graven, DO 07/02/17 1123

## 2017-07-03 ENCOUNTER — Encounter (HOSPITAL_COMMUNITY): Payer: Self-pay | Admitting: Hematology

## 2017-07-03 ENCOUNTER — Other Ambulatory Visit (HOSPITAL_COMMUNITY): Payer: Self-pay

## 2017-07-03 ENCOUNTER — Other Ambulatory Visit: Payer: Self-pay

## 2017-07-03 ENCOUNTER — Inpatient Hospital Stay (HOSPITAL_COMMUNITY): Payer: Medicaid Other | Attending: Hematology | Admitting: Hematology

## 2017-07-03 VITALS — BP 183/78 | HR 97 | Temp 99.0°F | Resp 18 | Wt 174.6 lb

## 2017-07-03 DIAGNOSIS — N189 Chronic kidney disease, unspecified: Secondary | ICD-10-CM

## 2017-07-03 DIAGNOSIS — F17210 Nicotine dependence, cigarettes, uncomplicated: Secondary | ICD-10-CM

## 2017-07-03 DIAGNOSIS — I130 Hypertensive heart and chronic kidney disease with heart failure and stage 1 through stage 4 chronic kidney disease, or unspecified chronic kidney disease: Secondary | ICD-10-CM | POA: Diagnosis not present

## 2017-07-03 DIAGNOSIS — Z9889 Other specified postprocedural states: Secondary | ICD-10-CM | POA: Diagnosis not present

## 2017-07-03 DIAGNOSIS — Z7982 Long term (current) use of aspirin: Secondary | ICD-10-CM | POA: Diagnosis not present

## 2017-07-03 DIAGNOSIS — R42 Dizziness and giddiness: Secondary | ICD-10-CM

## 2017-07-03 DIAGNOSIS — N4889 Other specified disorders of penis: Secondary | ICD-10-CM | POA: Diagnosis not present

## 2017-07-03 DIAGNOSIS — C44311 Basal cell carcinoma of skin of nose: Secondary | ICD-10-CM

## 2017-07-03 DIAGNOSIS — R5383 Other fatigue: Secondary | ICD-10-CM | POA: Diagnosis not present

## 2017-07-03 DIAGNOSIS — I509 Heart failure, unspecified: Secondary | ICD-10-CM

## 2017-07-03 DIAGNOSIS — D509 Iron deficiency anemia, unspecified: Secondary | ICD-10-CM | POA: Diagnosis not present

## 2017-07-03 DIAGNOSIS — Z79899 Other long term (current) drug therapy: Secondary | ICD-10-CM

## 2017-07-03 DIAGNOSIS — C44319 Basal cell carcinoma of skin of other parts of face: Secondary | ICD-10-CM | POA: Diagnosis present

## 2017-07-03 DIAGNOSIS — Z936 Other artificial openings of urinary tract status: Secondary | ICD-10-CM | POA: Diagnosis not present

## 2017-07-03 DIAGNOSIS — R339 Retention of urine, unspecified: Secondary | ICD-10-CM

## 2017-07-03 DIAGNOSIS — D500 Iron deficiency anemia secondary to blood loss (chronic): Secondary | ICD-10-CM

## 2017-07-03 NOTE — Progress Notes (Signed)
Michael [name], [place] [id number]   CLINIC:  Medical Oncology/Hematology  PCP:  Patient, No Pcp Per No address on file None   REASON FOR VISIT:  Follow-up for basal cell carcinoma and iron deficiency anemia.  CURRENT THERAPY: Intermittent Feraheme infusions.   INTERVAL HISTORY:  Michael [name] 62 y.o. male returns for follow-up after his surgery.  He underwent resection of the basal cell carcinoma on 06/06/2017.  Wound is healed very well.  He is overall very happy.  He is still on liquid diet.  He has some mild fatigue.  He had urinary retention last week and went to the ER when a Foley was inserted.  He saw a urologist to discontinue it.  The same night he had to go back to ER with retention.  He has a Foley catheter at this time.  He does complain of some pain at the tip of his penis.  Denies any bleeding per rectum or melena.  REVIEW OF SYSTEMS:  Review of Systems  Constitutional: Positive for fatigue.  Genitourinary:        Foley catheter hurts.  Neurological: Positive for dizziness.  All other systems reviewed and are negative.    PAST MEDICAL/SURGICAL HISTORY:  Past Medical History:  Diagnosis Date  . CHF (congestive heart failure) ([place])   . CKD (chronic kidney disease)   . Head and neck cancer ([place]) 2019  . HTN (hypertension)    Past Surgical History:  Procedure Laterality Date  . ANKLE CLOSED REDUCTION Right   . COLONOSCOPY N/A 05/31/2017   Procedure: COLONOSCOPY;  Surgeon: Daneil Dolin, MD;  Location: [facility];  Service: Endoscopy;  Laterality: N/A;  2:45pm  . POLYPECTOMY  05/31/2017   Procedure: POLYPECTOMY;  Surgeon: Daneil Dolin, MD;  Location: [facility];  Service: Endoscopy;;  . TONSILLECTOMY       SOCIAL HISTORY:  Social History   Socioeconomic History  . Marital status: Married    Spouse name: Not on file  . Number of children: Not on file  . Years of education: Not on file  . Highest education level:  Not on file  Occupational History  . Not on file  Social Needs  . Financial resource strain: Not on file  . Food insecurity:    Worry: Not on file    Inability: Not on file  . Transportation needs:    Medical: Not on file    Non-medical: Not on file  Tobacco Use  . Smoking status: Current Some Day Smoker    Packs/day: 0.10    Years: 40.00    Pack years: 4.00    Types: Cigarettes  . Smokeless tobacco: Never Used  Substance and Sexual Activity  . Alcohol use: Not Currently    Frequency: Never  . Drug use: Never  . Sexual activity: Not on file  Lifestyle  . Physical activity:    Days per week: Not on file    Minutes per session: Not on file  . Stress: Not on file  Relationships  . Social connections:    Talks on phone: Not on file    Gets together: Not on file    Attends religious service: Not on file    Active member of club or organization: Not on file    Attends meetings of clubs or organizations: Not on file    Relationship status: Not on file  . Intimate partner violence:    Fear of current or  ex partner: Not on file    Emotionally abused: Not on file    Physically abused: Not on file    Forced sexual activity: Not on file  Other Topics Concern  . Not on file  Social History Narrative  . Not on file    FAMILY HISTORY:  Family History  Problem Relation Age of Onset  . Stroke Father   . Cirrhosis Mother   . Colon cancer Neg Hx     CURRENT MEDICATIONS:  Outpatient Encounter Medications as of 07/03/2017  Medication Sig Note  . aspirin 81 MG chewable tablet Chew 1 tablet (81 mg total) by mouth daily.   Marland Kitchen atorvastatin (LIPITOR) 40 MG tablet Take 1 tablet (40 mg total) by mouth daily at 6 PM.   . carvedilol (COREG) 12.5 MG tablet Take 1 tablet (12.5 mg total) by mouth 2 (two) times daily with a meal.   . cephALEXin (KEFLEX) 500 MG capsule Take 1 capsule (500 mg total) by mouth 4 (four) times daily.   . ferrous sulfate 325 (65 FE) MG tablet Take 1 tablet (325 mg  total) by mouth 2 (two) times daily with a meal.   . furosemide (LASIX) 40 MG tablet Take 1 tablet (40 mg total) by mouth daily.   . potassium chloride SA (K-DUR,KLOR-CON) 20 MEQ tablet Take 1 tablet (20 mEq total) by mouth daily.   . [EXPIRED] amoxicillin-clavulanate (AUGMENTIN) 875-125 MG tablet Take 1 tablet by mouth 2 (two) times daily.  06/24/2017: Has not yet started   No facility-administered encounter medications on file as of 07/03/2017.     ALLERGIES:  No Known Allergies   PHYSICAL EXAM:  ECOG Performance status: 1  Vitals:   07/03/17 1510  BP: (!) 183/78  Pulse: 97  Resp: 18  Temp: 99 F (37.2 C)  SpO2: 100%   Filed Weights   07/03/17 1510  Weight: 174 lb 9.6 oz (79.2 kg)    Physical Exam Skin graft on the right cheek is well-healed.  LABORATORY DATA:  I have reviewed the labs as listed.  CBC    Component Value Date/Time   WBC 8.2 06/25/2017 0621   RBC 3.28 (L) 06/25/2017 0621   HGB 8.4 (L) 06/25/2017 0621   HGB 13.8 04/23/2011 0919   HCT 27.2 (L) 06/25/2017 0621   HCT 30.9 (L) 04/02/2017 0541   PLT 421 (H) 06/25/2017 0621   PLT 208 04/23/2011 0919   MCV 82.9 06/25/2017 0621   MCV 84 04/23/2011 0919   MCH 25.6 (L) 06/25/2017 0621   MCHC 30.9 06/25/2017 0621   RDW 16.6 (H) 06/25/2017 0621   RDW 12.3 04/23/2011 0919   LYMPHSABS 1.8 06/24/2017 1940   LYMPHSABS 1.1 04/23/2011 0919   MONOABS 1.2 (H) 06/24/2017 1940   MONOABS 0.9 (H) 04/23/2011 0919   EOSABS 0.1 06/24/2017 1940   EOSABS 0.0 04/23/2011 0919   BASOSABS 0.0 06/24/2017 1940   BASOSABS 0.0 04/23/2011 0919   CMP Latest Ref Rng & Units 06/25/2017 06/24/2017 06/24/2017  Glucose 65 - 99 mg/dL 89 95 93  BUN 6 - 20 mg/dL 17 16 19   Creatinine 0.61 - 1.24 mg/dL 1.23 1.50(H) 1.52(H)  Sodium 135 - 145 mmol/L 139 142 138  Potassium 3.5 - 5.1 mmol/L 3.0(L) 3.2(L) 3.4(L)  Chloride 101 - 111 mmol/L 105 104 103  CO2 22 - 32 mmol/L 26 - 26  Calcium 8.9 - 10.3 mg/dL 8.7(L) - 9.0  Total Protein 6.5 - 8.1  g/dL - - 6.2(L)  Total Bilirubin  0.3 - 1.2 mg/dL - - 0.6  Alkaline Phos 38 - 126 U/L - - 76  AST 15 - 41 U/L - - 23  ALT 17 - 63 U/L - - 21        ASSESSMENT & PLAN:   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 1.  Locally advanced basal cell carcinoma of the right nasolabial fold: -Biopsy of the right nasal mucosa on 04/05/2017 shows basal cell carcinoma.  Biopsy of the left cheek lesion on 04/02/2017 consistent with basal cell carcinoma. - He was initially evaluated by Dr. Blenda Nicely at [facility] and then was referred to Dr. Mardee Postin at [facility]. -he underwent resection with skin graft on 06/06/2017.  Deep margin was close.  Rest of the margins were negative.  He was presented at tumor board at [facility].  Recommendation was not to give any adjuvant therapy.  Overall he is very happy with the results. -he went to the emergency room last week for urinary retention.  He has a Foley catheter in place.  He follows up with urology.  2.  Microcytic anemia: He received about 3 to 4 infusions of Feraheme.  Last Feraheme was on 06/24/2017.  Hemoglobin on 06/25/2017 was 8.4 done in the ER.  I have suggested him to repeat blood work later this week.  We will order if he needs any Feraheme.  Otherwise I will see him back in 3 months with repeat blood work.  He is not taking B12 pills as he ran out of money.  3.  Uncontrolled hypertension: Patient started back on Coreg 12.5 mg twice daily.  His blood pressure is better at 183/78 today.  He will also continue Lipitor 40 mg daily and aspirin 81 mg daily.      Orders placed this encounter:  Orders Placed This Encounter  Procedures  . Vitamin B12  . Folate  . Iron and TIBC  . Ferritin  . CBC with Differential  . CBC with Differential  . Basic metabolic panel  . Vitamin B12  . Folate  . Iron and TIBC  . Ferritin      Derek Jack, MD [place] [phone]

## 2017-07-03 NOTE — Assessment & Plan Note (Signed)
1.  Locally advanced basal cell carcinoma of the right nasolabial fold: -Biopsy of the right nasal mucosa on 04/05/2017 shows basal cell carcinoma.  Biopsy of the left cheek lesion on 04/02/2017 consistent with basal cell carcinoma. - He was initially evaluated by Dr. Blenda Nicely at [facility] and then was referred to Dr. Mardee Postin at [facility]. -he underwent resection with skin graft on 06/06/2017.  Deep margin was close.  Rest of the margins were negative.  He was presented at tumor board at [facility].  Recommendation was not to give any adjuvant therapy.  Overall he is very happy with the results. -he went to the emergency room last week for urinary retention.  He has a Foley catheter in place.  He follows up with urology.  2.  Microcytic anemia: He received about 3 to 4 infusions of Feraheme.  Last Feraheme was on 06/24/2017.  Hemoglobin on 06/25/2017 was 8.4 done in the ER.  I have suggested him to repeat blood work later this week.  We will order if he needs any Feraheme.  Otherwise I will see him back in 3 months with repeat blood work.  He is not taking B12 pills as he ran out of money.  3.  Uncontrolled hypertension: Patient started back on Coreg 12.5 mg twice daily.  His blood pressure is better at 183/78 today.  He will also continue Lipitor 40 mg daily and aspirin 81 mg daily.

## 2017-07-04 ENCOUNTER — Inpatient Hospital Stay (HOSPITAL_COMMUNITY)
Admission: EM | Admit: 2017-07-04 | Discharge: 2017-07-07 | DRG: 699 | Disposition: A | Discharge status: Home Health | Payer: Medicaid Other | Attending: Internal Medicine | Admitting: Internal Medicine

## 2017-07-04 ENCOUNTER — Emergency Department (HOSPITAL_COMMUNITY): Payer: Medicaid Other

## 2017-07-04 ENCOUNTER — Encounter (HOSPITAL_COMMUNITY): Payer: Self-pay

## 2017-07-04 ENCOUNTER — Ambulatory Visit: Payer: Self-pay | Admitting: General Surgery

## 2017-07-04 DIAGNOSIS — I130 Hypertensive heart and chronic kidney disease with heart failure and stage 1 through stage 4 chronic kidney disease, or unspecified chronic kidney disease: Secondary | ICD-10-CM | POA: Diagnosis present

## 2017-07-04 DIAGNOSIS — I10 Essential (primary) hypertension: Secondary | ICD-10-CM | POA: Diagnosis present

## 2017-07-04 DIAGNOSIS — I6523 Occlusion and stenosis of bilateral carotid arteries: Secondary | ICD-10-CM | POA: Diagnosis present

## 2017-07-04 DIAGNOSIS — N182 Chronic kidney disease, stage 2 (mild): Secondary | ICD-10-CM

## 2017-07-04 DIAGNOSIS — Y846 Urinary catheterization as the cause of abnormal reaction of the patient, or of later complication, without mention of misadventure at the time of the procedure: Secondary | ICD-10-CM | POA: Diagnosis present

## 2017-07-04 DIAGNOSIS — R778 Other specified abnormalities of plasma proteins: Secondary | ICD-10-CM | POA: Diagnosis present

## 2017-07-04 DIAGNOSIS — I951 Orthostatic hypotension: Secondary | ICD-10-CM | POA: Diagnosis present

## 2017-07-04 DIAGNOSIS — R55 Syncope and collapse: Secondary | ICD-10-CM | POA: Diagnosis not present

## 2017-07-04 DIAGNOSIS — D649 Anemia, unspecified: Secondary | ICD-10-CM

## 2017-07-04 DIAGNOSIS — E876 Hypokalemia: Secondary | ICD-10-CM | POA: Diagnosis present

## 2017-07-04 DIAGNOSIS — N179 Acute kidney failure, unspecified: Secondary | ICD-10-CM | POA: Diagnosis not present

## 2017-07-04 DIAGNOSIS — T83511A Infection and inflammatory reaction due to indwelling urethral catheter, initial encounter: Secondary | ICD-10-CM

## 2017-07-04 DIAGNOSIS — N189 Chronic kidney disease, unspecified: Secondary | ICD-10-CM | POA: Diagnosis present

## 2017-07-04 DIAGNOSIS — T83518A Infection and inflammatory reaction due to other urinary catheter, initial encounter: Principal | ICD-10-CM | POA: Diagnosis present

## 2017-07-04 DIAGNOSIS — A084 Viral intestinal infection, unspecified: Secondary | ICD-10-CM | POA: Diagnosis present

## 2017-07-04 DIAGNOSIS — E860 Dehydration: Secondary | ICD-10-CM | POA: Diagnosis present

## 2017-07-04 DIAGNOSIS — I5042 Chronic combined systolic (congestive) and diastolic (congestive) heart failure: Secondary | ICD-10-CM | POA: Diagnosis present

## 2017-07-04 DIAGNOSIS — I350 Nonrheumatic aortic (valve) stenosis: Secondary | ICD-10-CM | POA: Diagnosis present

## 2017-07-04 DIAGNOSIS — R748 Abnormal levels of other serum enzymes: Secondary | ICD-10-CM

## 2017-07-04 DIAGNOSIS — B961 Klebsiella pneumoniae [K. pneumoniae] as the cause of diseases classified elsewhere: Secondary | ICD-10-CM | POA: Diagnosis present

## 2017-07-04 DIAGNOSIS — R197 Diarrhea, unspecified: Secondary | ICD-10-CM | POA: Diagnosis present

## 2017-07-04 DIAGNOSIS — F17210 Nicotine dependence, cigarettes, uncomplicated: Secondary | ICD-10-CM | POA: Diagnosis present

## 2017-07-04 DIAGNOSIS — D638 Anemia in other chronic diseases classified elsewhere: Secondary | ICD-10-CM | POA: Diagnosis present

## 2017-07-04 DIAGNOSIS — Z85828 Personal history of other malignant neoplasm of skin: Secondary | ICD-10-CM

## 2017-07-04 DIAGNOSIS — R7989 Other specified abnormal findings of blood chemistry: Secondary | ICD-10-CM | POA: Diagnosis present

## 2017-07-04 DIAGNOSIS — D509 Iron deficiency anemia, unspecified: Secondary | ICD-10-CM | POA: Diagnosis present

## 2017-07-04 DIAGNOSIS — R339 Retention of urine, unspecified: Secondary | ICD-10-CM

## 2017-07-04 DIAGNOSIS — N390 Urinary tract infection, site not specified: Secondary | ICD-10-CM | POA: Diagnosis present

## 2017-07-04 DIAGNOSIS — Z79899 Other long term (current) drug therapy: Secondary | ICD-10-CM

## 2017-07-04 DIAGNOSIS — Z7982 Long term (current) use of aspirin: Secondary | ICD-10-CM

## 2017-07-04 DIAGNOSIS — D72829 Elevated white blood cell count, unspecified: Secondary | ICD-10-CM

## 2017-07-04 HISTORY — DX: Iron deficiency anemia, unspecified: D50.9

## 2017-07-04 LAB — CBC WITH DIFFERENTIAL/PLATELET
ABS IMMATURE GRANULOCYTES: 0.1 10*3/uL (ref 0.0–0.1)
BASOS PCT: 0 %
Basophils Absolute: 0.1 10*3/uL (ref 0.0–0.1)
EOS ABS: 0 10*3/uL (ref 0.0–0.7)
Eosinophils Relative: 0 %
HCT: 27.4 % — ABNORMAL LOW (ref 39.0–52.0)
Hemoglobin: 8.4 g/dL — ABNORMAL LOW (ref 13.0–17.0)
IMMATURE GRANULOCYTES: 1 %
LYMPHS ABS: 1.7 10*3/uL (ref 0.7–4.0)
Lymphocytes Relative: 11 %
MCH: 25.5 pg — ABNORMAL LOW (ref 26.0–34.0)
MCHC: 30.7 g/dL (ref 30.0–36.0)
MCV: 83 fL (ref 78.0–100.0)
MONOS PCT: 10 %
Monocytes Absolute: 1.5 10*3/uL — ABNORMAL HIGH (ref 0.1–1.0)
NEUTROS ABS: 12.1 10*3/uL — AB (ref 1.7–7.7)
NEUTROS PCT: 78 %
PLATELETS: 273 10*3/uL (ref 150–400)
RBC: 3.3 MIL/uL — AB (ref 4.22–5.81)
RDW: 17.9 % — AB (ref 11.5–15.5)
WBC: 15.4 10*3/uL — AB (ref 4.0–10.5)

## 2017-07-04 LAB — URINALYSIS, ROUTINE W REFLEX MICROSCOPIC
Bilirubin Urine: NEGATIVE
GLUCOSE, UA: NEGATIVE mg/dL
Ketones, ur: NEGATIVE mg/dL
NITRITE: NEGATIVE
PH: 5 (ref 5.0–8.0)
Protein, ur: 100 mg/dL — AB
SPECIFIC GRAVITY, URINE: 1.015 (ref 1.005–1.030)
WBC, UA: >50 WBC/hpf — ABNORMAL HIGH (ref 0–5)

## 2017-07-04 LAB — BASIC METABOLIC PANEL
ANION GAP: 15 (ref 5–15)
BUN: 33 mg/dL — ABNORMAL HIGH (ref 6–20)
CALCIUM: 8.3 mg/dL — AB (ref 8.9–10.3)
CHLORIDE: 99 mmol/L — AB (ref 101–111)
CO2: 23 mmol/L (ref 22–32)
CREATININE: 2.39 mg/dL — AB (ref 0.61–1.24)
GFR calc non Af Amer: 27 mL/min — ABNORMAL LOW (ref >60)
GFR, EST AFRICAN AMERICAN: 32 mL/min — AB (ref >60)
Glucose, Bld: 121 mg/dL — ABNORMAL HIGH (ref 65–99)
Potassium: 3.1 mmol/L — ABNORMAL LOW (ref 3.5–5.1)
SODIUM: 137 mmol/L (ref 135–145)

## 2017-07-04 LAB — CK: Total CK: 177 U/L (ref 49–397)

## 2017-07-04 LAB — TROPONIN I: Troponin I: 0.11 ng/mL (ref <0.03)

## 2017-07-04 MED ORDER — SODIUM CHLORIDE 0.9 % IV SOLN
1.0000 g | Freq: Once | INTRAVENOUS | Status: AC
  Administered 2017-07-04: 1 g via INTRAVENOUS
  Filled 2017-07-04: qty 10

## 2017-07-04 MED ORDER — SODIUM CHLORIDE 0.9 % IV BOLUS
500.0000 mL | Freq: Once | INTRAVENOUS | Status: AC
  Administered 2017-07-04: 500 mL via INTRAVENOUS

## 2017-07-04 MED ORDER — LIDOCAINE HCL URETHRAL/MUCOSAL 2 % EX GEL
1.0000 "application " | Freq: Once | CUTANEOUS | Status: AC
  Administered 2017-07-04: 1 via TOPICAL
  Filled 2017-07-04: qty 20

## 2017-07-04 NOTE — ED Notes (Signed)
Catheter kit and urojet at bedside for provider to insert coude cath. Pt made aware of the same.

## 2017-07-04 NOTE — ED Provider Notes (Signed)
[facility] EMERGENCY DEPARTMENT Provider Note   CSN: [id number] Arrival date & time: 07/04/17  1802   History   Chief Complaint Chief Complaint  Patient presents with  . Loss of Consciousness    HPI Michael [name] is a 62 y.o. male who presents with syncope and a fall. PMH significant for right facial basal cell carcinoma status post resection with right partial maxillectomy and partial rhinectomy with skin graft on 06/13/2017, right carotid artery disease, hypertension, chronic diastolic and mild systolic CHF with EF of 67-12%, chronic iron deficiency anemia, stage II chronic kidney disease and hypertension. He states that yesterday he felt well. He went to sleep and this morning and when he was getting out of bed he passed out and woke up on the floor. He doesn't know how this happened. He felt very dizzy and was unable to get up after being on the ground. He normally walks. He crawled to a phone and was able to dial 911 after almost 12 hours. EMS arrived and noted that there was a large amount of blood loss (~1L) and he was covered in feces. The patient denies any significant pain and states he thinks dizziness is better now. No headache, chest pain, SOB, abdominal pain, N/V/D. He was seen at [facility] on 6/1 for recurrent urinary retention. Supposed to f/u with [facility] yesterday (which he did not). He was admitted for orthostatic hypotension from 5/25-5/26.    HPI  Past Medical History:  Diagnosis Date  . CHF (congestive heart failure) ([place])   . CKD (chronic kidney disease)   . Head and neck cancer ([place]) 2019  . HTN (hypertension)     Patient Active Problem List   Diagnosis Date Noted  . Carotid artery disease ([place]) 06/25/2017  . AKI (acute kidney injury) ([place])   . Orthostatic syncope   . Syncope due to orthostatic hypotension 06/24/2017  . Heart murmur 06/24/2017  . Abnormal EKG 06/24/2017  . Abnormal PET scan of colon 05/25/2017  . 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 Periapical abscess 04/04/2017  . Iron (Fe) deficiency anemia 04/04/2017  . Acute diastolic CHF (congestive heart failure) ([place]) 04/04/2017  . Essential hypertension 04/02/2017  . Anemia 04/02/2017  . Acute CHF (congestive heart failure) ([place]) 04/02/2017  . Acute hypoxemic respiratory failure ([place]) 04/02/2017  . Lactic acidosis 04/02/2017  . Tobacco abuse 04/02/2017  . CHF (congestive heart failure) ([place]) 04/02/2017  . Elevated troponin 04/02/2017  . Thrombocytosis ([place]) 04/02/2017  . Hypokalemia 04/02/2017  . Skin lesion of face   . Nasal lesion     Past Surgical History:  Procedure Laterality Date  . ANKLE CLOSED REDUCTION Right   . COLONOSCOPY N/A 05/31/2017   Procedure: COLONOSCOPY;  Surgeon: Daneil Dolin, MD;  Location: [facility];  Service: Endoscopy;  Laterality: N/A;  2:45pm  . POLYPECTOMY  05/31/2017   Procedure: POLYPECTOMY;  Surgeon: Daneil Dolin, MD;  Location: [facility];  Service: Endoscopy;;  . TONSILLECTOMY          Home Medications    Prior to Admission medications   Medication Sig Start Date End Date Taking? Authorizing Provider  aspirin 81 MG chewable tablet Chew 1 tablet (81 mg total) by mouth daily. 04/07/17   Mikhail, Velta Addison, DO  atorvastatin (LIPITOR) 40 MG tablet Take 1 tablet (40 mg total) by mouth daily at 6 PM. 05/23/17   Derek Jack, MD  carvedilol (COREG) 12.5 MG tablet Take 1 tablet (  12.5 mg total) by mouth 2 (two) times daily with a meal. 05/23/17   Derek Jack, MD  cephALEXin (KEFLEX) 500 MG capsule Take 1 capsule (500 mg total) by mouth 4 (four) times daily. 06/26/17   Fredia Sorrow, MD  ferrous sulfate 325 (65 FE) MG tablet Take 1 tablet (325 mg total) by mouth 2 (two) times daily with a meal. 05/23/17   Derek Jack, MD  furosemide (LASIX) 40 MG tablet Take 1 tablet (40 mg total) by mouth daily. 06/27/17   Dhungel, Nishant, MD  potassium chloride SA (K-DUR,KLOR-CON) 20  MEQ tablet Take 1 tablet (20 mEq total) by mouth daily. 05/23/17   Derek Jack, MD    Family History Family History  Problem Relation Age of Onset  . Stroke Father   . Cirrhosis Mother   . Colon cancer Neg Hx     Social History Social History   Tobacco Use  . Smoking status: Current Some Day Smoker    Packs/day: 0.10    Years: 40.00    Pack years: 4.00    Types: Cigarettes  . Smokeless tobacco: Never Used  Substance Use Topics  . Alcohol use: Not Currently    Frequency: Never  . Drug use: Never     Allergies   Patient has no known allergies.   Review of Systems Review of Systems  Constitutional: Negative for fever.  Respiratory: Negative for shortness of breath.   Cardiovascular: Negative for chest pain.  Gastrointestinal: Negative for abdominal pain, nausea and vomiting.  Genitourinary: Positive for difficulty urinating. Negative for dysuria.  Musculoskeletal: Negative for gait problem.  Neurological: Positive for dizziness and syncope. Negative for seizures, weakness and headaches.  Hematological: Does not bruise/bleed easily.  All other systems reviewed and are negative.    Physical Exam Updated Vital Signs BP 126/75   Pulse 96   Temp 98.3 F (36.8 C) (Temporal)   Resp 15   Ht 6' (1.829 m)   Wt 78.9 kg (174 lb)   SpO2 100%   BMI 23.60 kg/m   Physical Exam  Constitutional: He is oriented to person, place, and time. He appears well-developed and well-nourished. No distress.  Chronically ill appearing male in mild distress. He was being cleaned up by nursing on initial assessment because he is covered in feces. He is alert and oriented  HENT:  Head: Head is with contusion.  Right Ear: No hemotympanum.  Left Ear: No hemotympanum.  Nose: Nasal deformity present. No nasal septal hematoma. Epistaxis is observed.  Mouth/Throat: Uvula is midline, oropharynx is clear and moist and mucous membranes are normal.  Pt is s/p R maxillectomy and R  rhinectomy. There is dried blood on his face. Source seems to stem from the right nare. Bleeding is controlled  Additionally 2 prolene stitches are intact over his upper lip.  Eyes: Pupils are equal, round, and reactive to light. Conjunctivae are normal. Right eye exhibits no discharge. Left eye exhibits no discharge. No scleral icterus.  Neck: Normal range of motion.  Cardiovascular: Normal rate and regular rhythm.  Pulmonary/Chest: Effort normal and breath sounds normal. No respiratory distress.  Abdominal: Soft. Bowel sounds are normal. He exhibits no distension. There is no tenderness.  Genitourinary:  Genitourinary Comments: No inguinal lymphadenopathy or inguinal hernia noted. Normal circumcised penis with a lesion on the head of the penis. Testicles are nontender with normal lie. Normal scrotal appearance. Erythematous, tender rash in the bilateral groin area. Chaperone (Matt, RN) present during exam.    Neurological:  He is alert and oriented to person, place, and time.  Skin: Skin is warm and dry.  Psychiatric: He has a normal mood and affect. His behavior is normal.  Nursing note and vitals reviewed.    ED Treatments / Results  Labs (all labs ordered are listed, but only abnormal results are displayed) Labs Reviewed  BASIC METABOLIC PANEL - Abnormal; Notable for the following components:      Result Value   Potassium 3.1 (*)    Chloride 99 (*)    Glucose, Bld 121 (*)    BUN 33 (*)    Creatinine, Ser 2.39 (*)    Calcium 8.3 (*)    GFR calc non Af Amer 27 (*)    GFR calc Af Amer 32 (*)    All other components within normal limits  CBC WITH DIFFERENTIAL/PLATELET - Abnormal; Notable for the following components:   WBC 15.4 (*)    RBC 3.30 (*)    Hemoglobin 8.4 (*)    HCT 27.4 (*)    MCH 25.5 (*)    RDW 17.9 (*)    Neutro Abs 12.1 (*)    Monocytes Absolute 1.5 (*)    All other components within normal limits  TROPONIN I - Abnormal; Notable for the following components:    Troponin I 0.11 (*)    All other components within normal limits  URINE CULTURE  CK  URINALYSIS, ROUTINE W REFLEX MICROSCOPIC  BRAIN NATRIURETIC PEPTIDE    EKG EKG Interpretation  Date/Time:  Tuesday July 04 2017 18:37:51 EDT Ventricular Rate:  97 PR Interval:    QRS Duration: 108 QT Interval:  369 QTC Calculation: 469 R Axis:   87 Text Interpretation:  Sinus rhythm Multiform ventricular premature complexes Borderline right axis deviation LVH with secondary repolarization abnormality No STEMI.  Confirmed by Nanda Quinton [phone]) on 07/04/2017 7:08:05 PM   Radiology Ct Head Wo Contrast  Result Date: 07/04/2017 CLINICAL DATA:  Initial evaluation for acute trauma, fall. History of prior facial tumor resection with reconstructive surgery. EXAM: CT HEAD WITHOUT CONTRAST CT MAXILLOFACIAL WITHOUT CONTRAST TECHNIQUE: Multidetector CT imaging of the head and maxillofacial structures were performed using the standard protocol without intravenous contrast. Multiplanar CT image reconstructions of the maxillofacial structures were also generated. COMPARISON:  Prior CT from 04/03/2017. FINDINGS: CT HEAD FINDINGS Brain: Generalized age-related cerebral atrophy. Confluent hypodensity within the periventricular and deep white matter both cerebral hemispheres, consistent with advanced chronic small vessel ischemic change. Remote lacunar infarct present within the bilateral basal ganglia. No acute intracranial hemorrhage. No acute large vessel territory infarct. No mass lesion, midline shift or mass effect. No hydrocephalus. No extra-axial fluid collection. Vascular: No asymmetric hyperdense vessel. Calcified atherosclerosis present at the skull base Skull: No acute scalp soft tissue abnormality. Other: Mastoid air cells are clear. CT MAXILLOFACIAL FINDINGS Osseous: Postoperative changes from previous partial resection of the right maxilla. Interval placement of prosthetic right facial buttress implant. Inferior  right nasal bone has been resected as well. Plate screw fixation at the right orbital floor. Previously identified mass at the right nasal labial fold has been resected with probable free flap placement. Scattered surgical clips within this region. Scattered surgical clips present throughout this region as well as within the right submandibular space. Fat density with right maxillary sinus likely postsurgical. No acute fracture about the face. Mandible intact. Zygomatic arches intact. Right-to-left nasal septal deviation which remains intact. Poor dentition with scattered dental caries and periapical lucencies again noted. Orbits: Postoperative changes at  the right orbital floor as above. Globes intact. No retro-orbital hematoma or other pathology. No acute orbital fracture. Sinuses: Scattered mucosal thickening within the sphenoid and maxillary sinuses bilaterally. Soft tissues: No appreciable soft tissue injury about the face. Postsurgical changes at the right nasal labial fold. No definite residual mass. IMPRESSION: CT HEAD: 1. No acute intracranial abnormality. 2. Age-related cerebral atrophy with advanced chronic small vessel ischemic disease. CT MAXILLOFACIAL: 1. No acute maxillofacial injury identified. 2. Postoperative changes from prior right facial surgery for resection of right nasolabial fold mass as above. No definite recurrent mass identified. Electronically Signed   By: Jeannine Boga M.D.   On: 07/04/2017 21:53   Dg Chest Port 1 View  Result Date: 07/04/2017 CLINICAL DATA:  Acute onset of syncope. EXAM: PORTABLE CHEST 1 VIEW COMPARISON:  Chest radiograph and CTA of the chest performed 04/02/2017 FINDINGS: The lungs are well-aerated and clear. There is no evidence of focal opacification, pleural effusion or pneumothorax. The cardiomediastinal silhouette is within normal limits. No acute osseous abnormalities are seen. IMPRESSION: No acute cardiopulmonary process seen. Electronically Signed    By: Garald Balding M.D.   On: 07/04/2017 22:16   Ct Maxillofacial Wo Contrast  Result Date: 07/04/2017 CLINICAL DATA:  Initial evaluation for acute trauma, fall. History of prior facial tumor resection with reconstructive surgery. EXAM: CT HEAD WITHOUT CONTRAST CT MAXILLOFACIAL WITHOUT CONTRAST TECHNIQUE: Multidetector CT imaging of the head and maxillofacial structures were performed using the standard protocol without intravenous contrast. Multiplanar CT image reconstructions of the maxillofacial structures were also generated. COMPARISON:  Prior CT from 04/03/2017. FINDINGS: CT HEAD FINDINGS Brain: Generalized age-related cerebral atrophy. Confluent hypodensity within the periventricular and deep white matter both cerebral hemispheres, consistent with advanced chronic small vessel ischemic change. Remote lacunar infarct present within the bilateral basal ganglia. No acute intracranial hemorrhage. No acute large vessel territory infarct. No mass lesion, midline shift or mass effect. No hydrocephalus. No extra-axial fluid collection. Vascular: No asymmetric hyperdense vessel. Calcified atherosclerosis present at the skull base Skull: No acute scalp soft tissue abnormality. Other: Mastoid air cells are clear. CT MAXILLOFACIAL FINDINGS Osseous: Postoperative changes from previous partial resection of the right maxilla. Interval placement of prosthetic right facial buttress implant. Inferior right nasal bone has been resected as well. Plate screw fixation at the right orbital floor. Previously identified mass at the right nasal labial fold has been resected with probable free flap placement. Scattered surgical clips within this region. Scattered surgical clips present throughout this region as well as within the right submandibular space. Fat density with right maxillary sinus likely postsurgical. No acute fracture about the face. Mandible intact. Zygomatic arches intact. Right-to-left nasal septal deviation which  remains intact. Poor dentition with scattered dental caries and periapical lucencies again noted. Orbits: Postoperative changes at the right orbital floor as above. Globes intact. No retro-orbital hematoma or other pathology. No acute orbital fracture. Sinuses: Scattered mucosal thickening within the sphenoid and maxillary sinuses bilaterally. Soft tissues: No appreciable soft tissue injury about the face. Postsurgical changes at the right nasal labial fold. No definite residual mass. IMPRESSION: CT HEAD: 1. No acute intracranial abnormality. 2. Age-related cerebral atrophy with advanced chronic small vessel ischemic disease. CT MAXILLOFACIAL: 1. No acute maxillofacial injury identified. 2. Postoperative changes from prior right facial surgery for resection of right nasolabial fold mass as above. No definite recurrent mass identified. Electronically Signed   By: Jeannine Boga M.D.   On: 07/04/2017 21:53    Procedures BLADDER CATHETERIZATION Date/Time:  07/05/2017 12:01 AM Performed by: Recardo Evangelist, PA-C Authorized by: Recardo Evangelist, PA-C   Consent:    Consent obtained:  Verbal   Consent given by:  Patient   Risks discussed:  False passage, infection, urethral injury and pain   Alternatives discussed:  No treatment Pre-procedure details:    Procedure purpose:  Therapeutic   Preparation: Patient was prepped and draped in usual sterile fashion   Anesthesia (see MAR for exact dosages):    Anesthesia method:  Topical application   Topical anesthetic:  Lidocaine gel Procedure details:    Provider performed due to:  Complicated insertion   Catheter insertion:  Indwelling   Catheter type:  Coude   Catheter size:  16 Fr   Bladder irrigation: no     Number of attempts:  1   Urine characteristics:  Cloudy Post-procedure details:    Patient tolerance of procedure:  Tolerated well, no immediate complications .Suture Removal Date/Time: 07/05/2017 12:07 AM Performed by: Recardo Evangelist, PA-C Authorized by: Recardo Evangelist, PA-C   Consent:    Consent obtained:  Verbal   Consent given by:  Patient   Risks discussed:  Bleeding   Alternatives discussed:  No treatment Location:    Location:  Head/neck   Head/neck location: upper lip. Procedure details:    Wound appearance:  No signs of infection   Number of sutures removed:  2 Post-procedure details:    Post-removal:  No dressing applied   Patient tolerance of procedure:  Tolerated well, no immediate complications   (including critical care time)    Medications Ordered in ED Medications  cefTRIAXone (ROCEPHIN) 1 g in sodium chloride 0.9 % 100 mL IVPB (1 g Intravenous New Bag/Given 07/04/17 2356)  lidocaine (XYLOCAINE) 2 % jelly 1 application (1 application Topical Given 07/04/17 1922)  sodium chloride 0.9 % bolus 500 mL (0 mLs Intravenous Stopped 07/04/17 2216)     Initial Impression / Assessment and Plan / ED Course  I have reviewed the triage vital signs and the nursing notes.  Pertinent labs & imaging results that were available during my care of the patient were reviewed by me and considered in my medical decision making (see chart for details).  62 year old male presents with syncopal episode. On arrival he is covered in dried blood and feces. His vitals have been normal. He is obvious trauma to his face however he denies any pain. Bleeding has been controlled. Nursing was also concerned because his catheter was covered in feces and does not appear that he keeping the area clean. Will obtain labs, CT maxillofacial, CT head, EKG, UA.   CBC is remarkable for leukocytosis of 15.4. Anemia is the same as three days ago (8.4). He is not on blood thinners or actively bleeding. BMP is remarkable for hypokalemia (3.1), hypochloremia (99), elevated BUN/SCR (33/2.39) from baseline. CK is normal. Troponin is mildly elevated to 0.11. The patient adamantly denies CP/SOB. BNP was ordered. Foley catheter was inserted by me.  2 Stitches were removed. CT head and max-face was negative for fracture. CXR is negative for acute cardiopulmonary process. Orthostatic vitals were attempted to be obtained however the patient could not tolerate this. He is not low risk per [facility] syncope rule. Will admit for further management. Spoke with [place] with [facility] who will admit.  Final Clinical Impressions(s) / ED Diagnoses   Final diagnoses:  Syncope and collapse  AKI (acute kidney injury) ([place])  Elevated troponin    ED  Discharge Orders    None       Iris Pert 07/05/17 0009    Margette Fast, MD 07/05/17 [phone]

## 2017-07-04 NOTE — ED Notes (Signed)
Pt attempted to get orthostatic vitals and when pt sat up he got really dizzy and felt like he was going to pass out.

## 2017-07-04 NOTE — ED Notes (Signed)
Patient transported to CT 

## 2017-07-04 NOTE — ED Triage Notes (Signed)
Pt presents to ED from home via EMS after having a fall at about 0630 this morning. PT was unresponsive for unknown amount of time until he crawled to phone. EMS reports ~ 1L blood loss at the home. Pt recently had reconstructive sx of face d/t ca. Pt covered in stool and blood on arrival to ed. Reason of fall not known at this time.  #18 RAC 128/73 Hr 89 100%RA

## 2017-07-04 NOTE — ED Notes (Signed)
PT catheter removed by RN d/t being grossly contaminated with dried stool. Pt noted to have open sore to tip of penis.

## 2017-07-04 NOTE — ED Notes (Signed)
Critical Lab: Troponin 0.11 K. Ruthann Cancer, PA, made aware.

## 2017-07-05 ENCOUNTER — Encounter (HOSPITAL_COMMUNITY): Payer: Self-pay | Admitting: Cardiology

## 2017-07-05 ENCOUNTER — Other Ambulatory Visit: Payer: Self-pay

## 2017-07-05 DIAGNOSIS — R197 Diarrhea, unspecified: Secondary | ICD-10-CM

## 2017-07-05 DIAGNOSIS — R748 Abnormal levels of other serum enzymes: Secondary | ICD-10-CM | POA: Diagnosis not present

## 2017-07-05 DIAGNOSIS — Z7982 Long term (current) use of aspirin: Secondary | ICD-10-CM | POA: Diagnosis not present

## 2017-07-05 DIAGNOSIS — I951 Orthostatic hypotension: Secondary | ICD-10-CM | POA: Diagnosis present

## 2017-07-05 DIAGNOSIS — N170 Acute kidney failure with tubular necrosis: Secondary | ICD-10-CM

## 2017-07-05 DIAGNOSIS — E876 Hypokalemia: Secondary | ICD-10-CM

## 2017-07-05 DIAGNOSIS — I10 Essential (primary) hypertension: Secondary | ICD-10-CM | POA: Diagnosis not present

## 2017-07-05 DIAGNOSIS — R55 Syncope and collapse: Secondary | ICD-10-CM | POA: Diagnosis present

## 2017-07-05 DIAGNOSIS — E860 Dehydration: Secondary | ICD-10-CM | POA: Diagnosis present

## 2017-07-05 DIAGNOSIS — R339 Retention of urine, unspecified: Secondary | ICD-10-CM

## 2017-07-05 DIAGNOSIS — F17210 Nicotine dependence, cigarettes, uncomplicated: Secondary | ICD-10-CM | POA: Diagnosis present

## 2017-07-05 DIAGNOSIS — I6523 Occlusion and stenosis of bilateral carotid arteries: Secondary | ICD-10-CM | POA: Diagnosis present

## 2017-07-05 DIAGNOSIS — N390 Urinary tract infection, site not specified: Secondary | ICD-10-CM | POA: Diagnosis present

## 2017-07-05 DIAGNOSIS — N182 Chronic kidney disease, stage 2 (mild): Secondary | ICD-10-CM | POA: Diagnosis present

## 2017-07-05 DIAGNOSIS — D509 Iron deficiency anemia, unspecified: Secondary | ICD-10-CM | POA: Diagnosis present

## 2017-07-05 DIAGNOSIS — Y846 Urinary catheterization as the cause of abnormal reaction of the patient, or of later complication, without mention of misadventure at the time of the procedure: Secondary | ICD-10-CM | POA: Diagnosis present

## 2017-07-05 DIAGNOSIS — T83511A Infection and inflammatory reaction due to indwelling urethral catheter, initial encounter: Secondary | ICD-10-CM

## 2017-07-05 DIAGNOSIS — Z85828 Personal history of other malignant neoplasm of skin: Secondary | ICD-10-CM | POA: Diagnosis not present

## 2017-07-05 DIAGNOSIS — N179 Acute kidney failure, unspecified: Secondary | ICD-10-CM | POA: Diagnosis present

## 2017-07-05 DIAGNOSIS — T83518A Infection and inflammatory reaction due to other urinary catheter, initial encounter: Secondary | ICD-10-CM | POA: Diagnosis not present

## 2017-07-05 DIAGNOSIS — K591 Functional diarrhea: Secondary | ICD-10-CM | POA: Diagnosis not present

## 2017-07-05 DIAGNOSIS — A084 Viral intestinal infection, unspecified: Secondary | ICD-10-CM | POA: Diagnosis present

## 2017-07-05 DIAGNOSIS — I361 Nonrheumatic tricuspid (valve) insufficiency: Secondary | ICD-10-CM | POA: Diagnosis not present

## 2017-07-05 DIAGNOSIS — I130 Hypertensive heart and chronic kidney disease with heart failure and stage 1 through stage 4 chronic kidney disease, or unspecified chronic kidney disease: Secondary | ICD-10-CM | POA: Diagnosis present

## 2017-07-05 DIAGNOSIS — B961 Klebsiella pneumoniae [K. pneumoniae] as the cause of diseases classified elsewhere: Secondary | ICD-10-CM | POA: Diagnosis present

## 2017-07-05 DIAGNOSIS — J441 Chronic obstructive pulmonary disease with (acute) exacerbation: Secondary | ICD-10-CM | POA: Insufficient documentation

## 2017-07-05 DIAGNOSIS — I350 Nonrheumatic aortic (valve) stenosis: Secondary | ICD-10-CM | POA: Diagnosis present

## 2017-07-05 DIAGNOSIS — D72829 Elevated white blood cell count, unspecified: Secondary | ICD-10-CM | POA: Insufficient documentation

## 2017-07-05 DIAGNOSIS — D500 Iron deficiency anemia secondary to blood loss (chronic): Secondary | ICD-10-CM | POA: Diagnosis not present

## 2017-07-05 DIAGNOSIS — I5042 Chronic combined systolic (congestive) and diastolic (congestive) heart failure: Secondary | ICD-10-CM | POA: Diagnosis present

## 2017-07-05 DIAGNOSIS — Z79899 Other long term (current) drug therapy: Secondary | ICD-10-CM | POA: Diagnosis not present

## 2017-07-05 DIAGNOSIS — J449 Chronic obstructive pulmonary disease, unspecified: Secondary | ICD-10-CM | POA: Insufficient documentation

## 2017-07-05 DIAGNOSIS — T83510A Infection and inflammatory reaction due to cystostomy catheter, initial encounter: Secondary | ICD-10-CM | POA: Diagnosis not present

## 2017-07-05 DIAGNOSIS — N189 Chronic kidney disease, unspecified: Secondary | ICD-10-CM | POA: Diagnosis present

## 2017-07-05 LAB — COMPREHENSIVE METABOLIC PANEL
ALBUMIN: 2.5 g/dL — AB (ref 3.5–5.0)
ALK PHOS: 64 U/L (ref 38–126)
ALT: 75 U/L — AB (ref 17–63)
AST: 72 U/L — ABNORMAL HIGH (ref 15–41)
Anion gap: 9 (ref 5–15)
BUN: 40 mg/dL — AB (ref 6–20)
CALCIUM: 7.8 mg/dL — AB (ref 8.9–10.3)
CO2: 24 mmol/L (ref 22–32)
CREATININE: 2.55 mg/dL — AB (ref 0.61–1.24)
Chloride: 103 mmol/L (ref 101–111)
GFR calc Af Amer: 29 mL/min — ABNORMAL LOW (ref >60)
GFR calc non Af Amer: 25 mL/min — ABNORMAL LOW (ref >60)
GLUCOSE: 113 mg/dL — AB (ref 65–99)
Potassium: 3.2 mmol/L — ABNORMAL LOW (ref 3.5–5.1)
SODIUM: 136 mmol/L (ref 135–145)
Total Bilirubin: 0.4 mg/dL (ref 0.3–1.2)
Total Protein: 5.7 g/dL — ABNORMAL LOW (ref 6.5–8.1)

## 2017-07-05 LAB — ABO/RH: ABO/RH(D): O POS

## 2017-07-05 LAB — TROPONIN I
Troponin I: 0.16 ng/mL (ref <0.03)
Troponin I: 0.18 ng/mL (ref <0.03)
Troponin I: 0.13 ng/mL (ref <0.03)

## 2017-07-05 LAB — CBC
HEMATOCRIT: 24.1 % — AB (ref 39.0–52.0)
HEMOGLOBIN: 7.5 g/dL — AB (ref 13.0–17.0)
MCH: 25.6 pg — ABNORMAL LOW (ref 26.0–34.0)
MCHC: 31.1 g/dL (ref 30.0–36.0)
MCV: 82.3 fL (ref 78.0–100.0)
Platelets: 249 10*3/uL (ref 150–400)
RBC: 2.93 MIL/uL — AB (ref 4.22–5.81)
RDW: 18.2 % — ABNORMAL HIGH (ref 11.5–15.5)
WBC: 14.2 10*3/uL — ABNORMAL HIGH (ref 4.0–10.5)

## 2017-07-05 LAB — MAGNESIUM: Magnesium: 1.8 mg/dL (ref 1.7–2.4)

## 2017-07-05 LAB — C DIFFICILE QUICK SCREEN W PCR REFLEX
C DIFFICLE (CDIFF) ANTIGEN: NEGATIVE
C Diff interpretation: NOT DETECTED
C Diff toxin: NEGATIVE

## 2017-07-05 LAB — BRAIN NATRIURETIC PEPTIDE: B NATRIURETIC PEPTIDE 5: 151 pg/mL — AB (ref 0.0–100.0)

## 2017-07-05 MED ORDER — CARVEDILOL 12.5 MG PO TABS
12.5000 mg | ORAL_TABLET | Freq: Two times a day (BID) | ORAL | Status: DC
  Administered 2017-07-05 – 2017-07-07 (×6): 12.5 mg via ORAL
  Filled 2017-07-05 (×6): qty 1

## 2017-07-05 MED ORDER — ALBUTEROL SULFATE (2.5 MG/3ML) 0.083% IN NEBU: 2.5000 mg | INHALATION_SOLUTION | Freq: Four times a day (QID) | RESPIRATORY_TRACT | Status: DC | PRN

## 2017-07-05 MED ORDER — ASPIRIN 81 MG PO CHEW
81.0000 mg | CHEWABLE_TABLET | Freq: Every day | ORAL | Status: DC
  Administered 2017-07-05 – 2017-07-06 (×2): 81 mg via ORAL
  Filled 2017-07-05 (×2): qty 1

## 2017-07-05 MED ORDER — SODIUM CHLORIDE 0.9% FLUSH
3.0000 mL | Freq: Two times a day (BID) | INTRAVENOUS | Status: DC
  Administered 2017-07-05 – 2017-07-06 (×2): 3 mL via INTRAVENOUS

## 2017-07-05 MED ORDER — SODIUM CHLORIDE 0.9 % IV SOLN
INTRAVENOUS | Status: DC
  Administered 2017-07-05 – 2017-07-06 (×4): via INTRAVENOUS

## 2017-07-05 MED ORDER — ONDANSETRON HCL 4 MG/2ML IJ SOLN: 4.0000 mg | Freq: Four times a day (QID) | INTRAMUSCULAR | Status: DC | PRN

## 2017-07-05 MED ORDER — ACETAMINOPHEN 650 MG RE SUPP: 650.0000 mg | Freq: Four times a day (QID) | RECTAL | Status: DC | PRN

## 2017-07-05 MED ORDER — ENSURE ENLIVE PO LIQD
237.0000 mL | Freq: Two times a day (BID) | ORAL | Status: DC
  Administered 2017-07-05 – 2017-07-06 (×3): 237 mL via ORAL

## 2017-07-05 MED ORDER — LOPERAMIDE HCL 2 MG PO CAPS
4.0000 mg | ORAL_CAPSULE | Freq: Once | ORAL | Status: AC
  Administered 2017-07-05: 4 mg via ORAL
  Filled 2017-07-05: qty 2

## 2017-07-05 MED ORDER — POTASSIUM CHLORIDE 10 MEQ/100ML IV SOLN
10.0000 meq | INTRAVENOUS | Status: AC
  Administered 2017-07-05 (×2): 10 meq via INTRAVENOUS
  Filled 2017-07-05 (×2): qty 100

## 2017-07-05 MED ORDER — ATORVASTATIN CALCIUM 40 MG PO TABS
40.0000 mg | ORAL_TABLET | Freq: Every day | ORAL | Status: DC
  Administered 2017-07-05 – 2017-07-07 (×3): 40 mg via ORAL
  Filled 2017-07-05 (×3): qty 1

## 2017-07-05 MED ORDER — HEPARIN SODIUM (PORCINE) 5000 UNIT/ML IJ SOLN
5000.0000 [IU] | Freq: Three times a day (TID) | INTRAMUSCULAR | Status: DC
  Administered 2017-07-06: 5000 [IU] via SUBCUTANEOUS
  Filled 2017-07-05 (×3): qty 1

## 2017-07-05 MED ORDER — POTASSIUM CHLORIDE CRYS ER 20 MEQ PO TBCR
40.0000 meq | EXTENDED_RELEASE_TABLET | ORAL | Status: AC
  Administered 2017-07-05: 40 meq via ORAL
  Filled 2017-07-05: qty 2

## 2017-07-05 MED ORDER — ACETAMINOPHEN 325 MG PO TABS: 650.0000 mg | ORAL_TABLET | Freq: Four times a day (QID) | ORAL | Status: DC | PRN

## 2017-07-05 MED ORDER — ONDANSETRON HCL 4 MG PO TABS: 4.0000 mg | ORAL_TABLET | Freq: Four times a day (QID) | ORAL | Status: DC | PRN

## 2017-07-05 MED ORDER — CEFTRIAXONE SODIUM 1 G IJ SOLR
1.0000 g | INTRAMUSCULAR | Status: DC
  Administered 2017-07-05 – 2017-07-06 (×2): 1 g via INTRAVENOUS
  Filled 2017-07-05: qty 10
  Filled 2017-07-05: qty 1

## 2017-07-05 MED ORDER — FERROUS SULFATE 325 (65 FE) MG PO TABS
325.0000 mg | ORAL_TABLET | Freq: Two times a day (BID) | ORAL | Status: DC
  Administered 2017-07-05 – 2017-07-07 (×6): 325 mg via ORAL
  Filled 2017-07-05 (×6): qty 1

## 2017-07-05 NOTE — Progress Notes (Signed)
Patient has a friend Laveda Abbe that will pick him up at discharge. He can be reached at  [phone].

## 2017-07-05 NOTE — H&P (Signed)
History and Physical    Bladimir Auman UMP:[id number] DOB: [birth year] DOA: 07/04/2017  Referring MD/NP/PA: Janetta Hora, PA-C PCP: Patient, No Pcp Per  Patient coming from: home via EMS  Chief Complaint: Syncope  I have personally briefly reviewed patient's old medical records in [place]   HPI: Demarcus Thielke is a 62 y.o. male with medical history significant of right facial basal cell carcinoma s/p resection with right partial mastectomy and partial rhinectomy with skin graft placement on 06/13/2017, HTN, chronic CHF last EF noted to be 50 to 55%, right internal carotid stenosis, iron deficiency anemia, and CKD stage II; who presents after having syncopal event.  Patient recalls going to sleep and waking up this morning and he had gotten out of bed.  The next thing he recalls was waking up on the ground.  He reports having to crawl from his bedroom to an adjacent room which took almost 12 hours reportedl to reach a phone.  Upon EMS arrival they noted a large amount of blood estimated 1 L, and patient was covered in feces.  Since his surgery patient reports being on a full liquid diet and reports having up to 7 bowel movements per day.  Associated symptoms include complaints of dizziness.  Denies any chest pain, headache, shortness of breath, abdominal pain, nausea, or vomiting.  Patient was admitted for syncopal episode secondary to orthostatic hypotension from 5/25 to 5/26.  He had not been able to follow-up with cardiology as of yet.  Review of records shows the patient was seen for recurrent urinary retention at [facility] on 6/1, and a Foley catheter was placed at that time after he had recently been discontinued after his appointments with [facility].  He had not been able  make the repeat follow-up appointment yesterday.  ED Course: Upon admission into the emergency department patient was seen to be afebrile, pulse 46-96, respiration 14-20, blood pressures 115/70 126/75, and O2  saturation maintained on room air.  Initial orthostatics could not be obtained due to patient complaining of dizziness.  Labs revealed WBC 13.4, hemoglobin 8.4, potassium 3.1, BUN 33, creatinine 2.39, troponin 0.11, and CK 177.  CT imaging of the brain and maxillofacial showed no acute abnormalities.  Urinalysis was positive for large leukocytes, large hemoglobin, many bacteria, and >50 WBCs.  Patient was reported to be covered in feces and urine for which she was cleaned up and his old Foley catheter was replaced.  Patient was given 500 mL of normal saline IV fluids and Rocephin IV.  TRH called to admit.  Review of Systems  Constitutional: Negative for chills and fever.  HENT: Negative for ear discharge.   Eyes: Negative for photophobia and pain.  Respiratory: Negative for cough and shortness of breath.   Cardiovascular: Negative for chest pain and leg swelling.  Gastrointestinal: Positive for diarrhea. Negative for abdominal pain, nausea and vomiting.  Genitourinary: Negative for dysuria and flank pain.  Musculoskeletal: Positive for falls.  Skin: Negative for itching and rash.  Neurological: Positive for loss of consciousness.  Psychiatric/Behavioral: Negative for depression and substance abuse.    Past Medical History:  Diagnosis Date  . CHF (congestive heart failure) ([place])   . CKD (chronic kidney disease)   . Head and neck cancer ([place]) 2019  . HTN (hypertension)     Past Surgical History:  Procedure Laterality Date  . ANKLE CLOSED REDUCTION Right   . COLONOSCOPY N/A 05/31/2017   Procedure: COLONOSCOPY;  Surgeon: Daneil Dolin, MD;  [facility];  Service: Endoscopy;  Laterality: N/A;  2:45pm  . POLYPECTOMY  05/31/2017   Procedure: POLYPECTOMY;  Surgeon: Daneil Dolin, MD;  Location: [facility];  Service: Endoscopy;;  . TONSILLECTOMY       reports that he has been smoking cigarettes.  He has a 4.00 pack-year smoking history. He has never used smokeless tobacco. He  reports that he drank alcohol. He reports that he does not use drugs.  No Known Allergies  Family History  Problem Relation Age of Onset  . Stroke Father   . Cirrhosis Mother   . Colon cancer Neg Hx     Prior to Admission medications   Medication Sig Start Date End Date Taking? Authorizing Provider  aspirin 81 MG chewable tablet Chew 1 tablet (81 mg total) by mouth daily. 04/07/17   Mikhail, Velta Addison, DO  atorvastatin (LIPITOR) 40 MG tablet Take 1 tablet (40 mg total) by mouth daily at 6 PM. 05/23/17   Derek Jack, MD  carvedilol (COREG) 12.5 MG tablet Take 1 tablet (12.5 mg total) by mouth 2 (two) times daily with a meal. 05/23/17   Derek Jack, MD  cephALEXin (KEFLEX) 500 MG capsule Take 1 capsule (500 mg total) by mouth 4 (four) times daily. 06/26/17   Fredia Sorrow, MD  ferrous sulfate 325 (65 FE) MG tablet Take 1 tablet (325 mg total) by mouth 2 (two) times daily with a meal. 05/23/17   Derek Jack, MD  furosemide (LASIX) 40 MG tablet Take 1 tablet (40 mg total) by mouth daily. 06/27/17   Dhungel, Nishant, MD  potassium chloride SA (K-DUR,KLOR-CON) 20 MEQ tablet Take 1 tablet (20 mEq total) by mouth daily. 05/23/17   Derek Jack, MD    Physical Exam:  Constitutional: NAD, calm, comfortable Vitals:   07/04/17 1930 07/04/17 2215 07/04/17 2315 07/05/17 0015  BP: (!) 121/50 (!) 123/59 115/70 (!) 128/58  Pulse: (!) 46 94 92 91  Resp: 20 17 14  (!) 21  Temp:      TempSrc:      SpO2: 100% 99% 100% 98%  Weight:      Height:       Eyes: PERRL, lids and conjunctivae normal ENMT: Deformity of the right side of the face and nose with skin flap present and dried blood. Neck: normal, supple, no masses, no thyromegaly Respiratory: clear to auscultation bilaterally, no wheezing, no crackles. Normal respiratory effort. No accessory muscle use.  Cardiovascular: Regular rate and rhythm, positive systolic murmurs / rubs / gallops. No extremity edema. 2+ pedal  pulses. No carotid bruits.  Abdomen: no tenderness, no masses palpated. No hepatosplenomegaly. Bowel sounds positive.  Foley catheter in place currently draining yellow urine. Musculoskeletal: no clubbing / cyanosis. No joint deformity upper and lower extremities. Good ROM, no contractures. Normal muscle tone.  Skin: Skin flap present all the erythema surrounding skin and previous surgical areas of the left arm and left thigh. Neurologic: CN 2-12 grossly intact. Sensation intact, DTR normal. Strength 5/5 in all 4.  Psychiatric: Normal judgment and insight. Alert and oriented x 3. Normal mood.     Labs on Admission: I have personally reviewed following labs and imaging studies  CBC: Recent Labs  Lab 07/04/17 1938  WBC 15.4*  NEUTROABS 12.1*  HGB 8.4*  HCT 27.4*  MCV 83.0  PLT 268   Basic Metabolic Panel: Recent Labs  Lab 07/04/17 1938  NA 137  K 3.1*  CL 99*  CO2 23  GLUCOSE 121*  BUN 33*  CREATININE 2.39*  CALCIUM 8.3*   GFR: Estimated Creatinine Clearance: 34.7 mL/min (A) (by C-G formula based on SCr of 2.39 mg/dL (H)). Liver Function Tests: No results for input(s): AST, ALT, ALKPHOS, BILITOT, PROT, ALBUMIN in the last 168 hours. No results for input(s): LIPASE, AMYLASE in the last 168 hours. No results for input(s): AMMONIA in the last 168 hours. Coagulation Profile: No results for input(s): INR, PROTIME in the last 168 hours. Cardiac Enzymes: Recent Labs  Lab 07/04/17 1938 07/04/17 1939  CKTOTAL 177  --   TROPONINI  --  0.11*   BNP (last 3 results) No results for input(s): PROBNP in the last 8760 hours. HbA1C: No results for input(s): HGBA1C in the last 72 hours. CBG: No results for input(s): GLUCAP in the last 168 hours. Lipid Profile: No results for input(s): CHOL, HDL, LDLCALC, TRIG, CHOLHDL, LDLDIRECT in the last 72 hours. Thyroid Function Tests: No results for input(s): TSH, T4TOTAL, FREET4, T3FREE, THYROIDAB in the last 72 hours. Anemia Panel: No  results for input(s): VITAMINB12, FOLATE, FERRITIN, TIBC, IRON, RETICCTPCT in the last 72 hours. Urine analysis:    Component Value Date/Time   COLORURINE AMBER (A) 07/04/2017 2216   APPEARANCEUR CLOUDY (A) 07/04/2017 2216   APPEARANCEUR Cloudy 04/23/2011 1044   LABSPEC 1.015 07/04/2017 2216   LABSPEC 1.018 04/23/2011 1044   PHURINE 5.0 07/04/2017 2216   GLUCOSEU NEGATIVE 07/04/2017 2216   GLUCOSEU Negative 04/23/2011 1044   HGBUR LARGE (A) 07/04/2017 2216   BILIRUBINUR NEGATIVE 07/04/2017 2216   BILIRUBINUR Negative 04/23/2011 1044   [place] 07/04/2017 2216   PROTEINUR 100 (A) 07/04/2017 2216   NITRITE NEGATIVE 07/04/2017 2216   LEUKOCYTESUR LARGE (A) 07/04/2017 2216   LEUKOCYTESUR Trace 04/23/2011 1044   Sepsis Labs: Recent Results (from the past 240 hour(s))  Urine culture     Status: None   Collection Time: 06/26/17  2:55 PM  Result Value Ref Range Status   Specimen Description   Final    URINE, CLEAN CATCH Performed at [facility], [address] [place], [place] [address]    Special Requests   Final    NONE Performed at [facility], [address] [place], [place] [address]    Culture   Final    NO GROWTH Performed at [facility], [place] [address], [place], [place] [address]    Report Status 06/28/2017 FINAL  Final     Radiological Exams on Admission: Ct Head Wo Contrast  Result Date: 07/04/2017 CLINICAL DATA:  Initial evaluation for acute trauma, fall. History of prior facial tumor resection with reconstructive surgery. EXAM: CT HEAD WITHOUT CONTRAST CT MAXILLOFACIAL WITHOUT CONTRAST TECHNIQUE: Multidetector CT imaging of the head and maxillofacial structures were performed using the standard protocol without intravenous contrast. Multiplanar CT image reconstructions of the maxillofacial structures were also generated. COMPARISON:  Prior CT from 04/03/2017. FINDINGS: CT HEAD FINDINGS Brain: Generalized age-related cerebral atrophy. Confluent  hypodensity within the periventricular and deep white matter both cerebral hemispheres, consistent with advanced chronic small vessel ischemic change. Remote lacunar infarct present within the bilateral basal ganglia. No acute intracranial hemorrhage. No acute large vessel territory infarct. No mass lesion, midline shift or mass effect. No hydrocephalus. No extra-axial fluid collection. Vascular: No asymmetric hyperdense vessel. Calcified atherosclerosis present at the skull base Skull: No acute scalp soft tissue abnormality. Other: Mastoid air cells are clear. CT MAXILLOFACIAL FINDINGS Osseous: Postoperative changes from previous partial resection of the right maxilla. Interval placement of prosthetic  right facial buttress implant. Inferior right nasal bone has been resected as well. Plate screw fixation at the right orbital floor. Previously identified mass at the right nasal labial fold has been resected with probable free flap placement. Scattered surgical clips within this region. Scattered surgical clips present throughout this region as well as within the right submandibular space. Fat density with right maxillary sinus likely postsurgical. No acute fracture about the face. Mandible intact. Zygomatic arches intact. Right-to-left nasal septal deviation which remains intact. Poor dentition with scattered dental caries and periapical lucencies again noted. Orbits: Postoperative changes at the right orbital floor as above. Globes intact. No retro-orbital hematoma or other pathology. No acute orbital fracture. Sinuses: Scattered mucosal thickening within the sphenoid and maxillary sinuses bilaterally. Soft tissues: No appreciable soft tissue injury about the face. Postsurgical changes at the right nasal labial fold. No definite residual mass. IMPRESSION: CT HEAD: 1. No acute intracranial abnormality. 2. Age-related cerebral atrophy with advanced chronic small vessel ischemic disease. CT MAXILLOFACIAL: 1. No acute  maxillofacial injury identified. 2. Postoperative changes from prior right facial surgery for resection of right nasolabial fold mass as above. No definite recurrent mass identified. Electronically Signed   By: Jeannine Boga M.D.   On: 07/04/2017 21:53   Dg Chest Port 1 View  Result Date: 07/04/2017 CLINICAL DATA:  Acute onset of syncope. EXAM: PORTABLE CHEST 1 VIEW COMPARISON:  Chest radiograph and CTA of the chest performed 04/02/2017 FINDINGS: The lungs are well-aerated and clear. There is no evidence of focal opacification, pleural effusion or pneumothorax. The cardiomediastinal silhouette is within normal limits. No acute osseous abnormalities are seen. IMPRESSION: No acute cardiopulmonary process seen. Electronically Signed   By: Garald Balding M.D.   On: 07/04/2017 22:16   Ct Maxillofacial Wo Contrast  Result Date: 07/04/2017 CLINICAL DATA:  Initial evaluation for acute trauma, fall. History of prior facial tumor resection with reconstructive surgery. EXAM: CT HEAD WITHOUT CONTRAST CT MAXILLOFACIAL WITHOUT CONTRAST TECHNIQUE: Multidetector CT imaging of the head and maxillofacial structures were performed using the standard protocol without intravenous contrast. Multiplanar CT image reconstructions of the maxillofacial structures were also generated. COMPARISON:  Prior CT from 04/03/2017. FINDINGS: CT HEAD FINDINGS Brain: Generalized age-related cerebral atrophy. Confluent hypodensity within the periventricular and deep white matter both cerebral hemispheres, consistent with advanced chronic small vessel ischemic change. Remote lacunar infarct present within the bilateral basal ganglia. No acute intracranial hemorrhage. No acute large vessel territory infarct. No mass lesion, midline shift or mass effect. No hydrocephalus. No extra-axial fluid collection. Vascular: No asymmetric hyperdense vessel. Calcified atherosclerosis present at the skull base Skull: No acute scalp soft tissue abnormality.  Other: Mastoid air cells are clear. CT MAXILLOFACIAL FINDINGS Osseous: Postoperative changes from previous partial resection of the right maxilla. Interval placement of prosthetic right facial buttress implant. Inferior right nasal bone has been resected as well. Plate screw fixation at the right orbital floor. Previously identified mass at the right nasal labial fold has been resected with probable free flap placement. Scattered surgical clips within this region. Scattered surgical clips present throughout this region as well as within the right submandibular space. Fat density with right maxillary sinus likely postsurgical. No acute fracture about the face. Mandible intact. Zygomatic arches intact. Right-to-left nasal septal deviation which remains intact. Poor dentition with scattered dental caries and periapical lucencies again noted. Orbits: Postoperative changes at the right orbital floor as above. Globes intact. No retro-orbital hematoma or other pathology. No acute orbital fracture. Sinuses: Scattered mucosal  thickening within the sphenoid and maxillary sinuses bilaterally. Soft tissues: No appreciable soft tissue injury about the face. Postsurgical changes at the right nasal labial fold. No definite residual mass. IMPRESSION: CT HEAD: 1. No acute intracranial abnormality. 2. Age-related cerebral atrophy with advanced chronic small vessel ischemic disease. CT MAXILLOFACIAL: 1. No acute maxillofacial injury identified. 2. Postoperative changes from prior right facial surgery for resection of right nasolabial fold mass as above. No definite recurrent mass identified. Electronically Signed   By: Jeannine Boga M.D.   On: 07/04/2017 21:53    EKG: Independently reviewed.  Sinus rhythm at 96 bpm with PVCs and borderline right axis deviation  Assessment/Plan Syncope 2/2 suspected orthostatic hypotension: Acute.  Patient reports standing up after nap and next recalls waking up on the floor. - Admit to  telemetry bed - Check orthostatic vital signs in a.m. - IV fluids as tolerated - May warrant further investigation  Catheter associated urinary tract infection: Previous urine culture from 5/27 was noted to show no growth.  Had previously been on cephalexin for treatment. - Follow-up urine culture  - Continue Rocephin  Leukocytosis: Acute.  WBC elevated at 14.4.  Suspect secondary to above. - Recheck CBC in  Acute renal failure superimposed on chronic kidney disease stage II: Patient's baseline creatinine previously noted to be around 1.23 at last discharge.  Patient presents with a creatinine of 2.39 with BUN 33.  There were no significant signs of rhabdomyolysis and patient received 500 mL normal saline IV fluids. - Strict intake and output - IV fluids normal saline at 100 mL/h as tolerated - Hold nephrotoxic agents  Elevated troponin: Acute.  Initial troponin elevated at 0.11.  Patient denies any chest pain symptoms at this time.  - Trend cardiac troponin   Essential hypertension - Hold furosemide due to acute renal failure - Continue metoprolol  Hypokalemia: Acute.  Potassium 3.1 on admission. - Give 40 mEq of potassium chloride - Held daily schedule supplement patient dose as Lasix on hold - Continue to monitor and replace as needed    Iron deficiency anemia: Hemoglobin 8.4 which appears similar to previous after receiving 1 unit of packed red blood cells during admission on 5/25.  Patient was noted to have loss significant amount of blood after his fall. -Type and screen for possible need of blood products. - Recheck CBC in a.m. - Continue ferrous sulfate  Systolic congestive heart failure: Appears to be  hypovolemic at this time.  Last EF noted to be 50 to 55% with grade 1 diastolic dysfunction. - Strict intake and output  Aortic stenosis: Patient reported to have greater than 80% stenosis of the right internal carotid artery.  Urinary retention with chronic indwelling  foley: Patient's old Foley catheter was replaced while in the ED. - Continue Foley catheter - Will need to have rescheduled appointment made with [facility]  DVT prophylaxis: heparin   Code Status: full Family Communication: No family present at bedside Disposition Plan: TBD  Consults called: none  Admission status: Inpatient  Norval Morton MD [facility] Pager [phone]   If 7PM-7AM, please contact night-coverage [email or web address] Password [email or web address]  07/05/2017, 1:35 AM

## 2017-07-05 NOTE — Consult Note (Addendum)
Cardiology Consultation:   Patient ID: Michael [name]; [id number]; [birth year]   Admit date: 07/04/2017 Date of Consult: 07/05/2017  Primary Care Provider: Patient, No Pcp Per Primary Cardiologist: new; Dr. Shelva Majestic, MD   Patient Profile:   Michael [name] is a 62 y.o. male with a hx of right facial basal cell carcinoma s/p resection with right partial mastectomy and partal rhinectomy with skin graft (06/13/17), HTN, tobacco use, chronic diastolic CHF with LVEF 25-85%, R internal carotid stenosis, iron deficiency anemia and CKD stage II who is being seen today for the evaluation of elevated troponin at the request of Dr. Tamala Julian with internal medicine.  History of Present Illness:   Michael [name] is a 62yo M with a hx as stated above who presented to the ED on 07/04/17 from home after a syncopal event yesterday morning. Pt states that he remembers waking up on the morning of the event, the next thing that he remembers is coming to on the floor in his bedroom. He remembers being extremely fatigued and it taking a long time to find the phone to call for help. Upon EMS arrival, the pt was reportedly covered in a large amount of blood (approximately 1L from facial contusion obtained from fall) and feces. He reports occasional associated dizziness with change of position however denies chest pain, SOB, palpitations, orthopnea, LE swelling and N/V.  Patient has no personal or family history of CAD.  He reports ongoing tobacco use and denies alcohol use. He is retired from [employer], living alone in [place].    Of note, the patient was recently admitted and treated for orthostatic hypotension from 06/24/17-06/25/17 (given IV hydration, d/c'ed lisinopril, continued Coreg and Lasix) and has not yet been followed in the OP setting. Additionally, he has a history of urinary retention upon record review. The patient was last seen at [facility] on 07/01/17 where an indwelling catheter was placed. He is now  followed with [facility].   In the ED, the pt was unable to undergo orthostatics secondary to continued dizziness. EKG revealed NSR with HR of 97 and evidence of LVH with no acute ischemic ST-T wave abnormalities. WBC elevated at 13.4. Creatinine elevated at 2.39. Troponin mildly elevated at 0.11. Head CT completed showed no acute intracranial abnormality with age-related cerebral atrophy and advanced chronic small vessel ischemic disease. CXR unremarkable. UA positive for large leukocytes, HB, and bacteria. Pt received 562ml bolus in the ED along with IV antibiotics.  Pt was admitted per IM and cardiology was consulted secondary to elevated troponin.  Past Medical History:  Diagnosis Date  . Acute urinary retention    followed by OP urology   . Carotid stenosis   . CHF (congestive heart failure) (HCC)    LVEF 50-55% 03/2017  . CKD (chronic kidney disease)    Stage II  . Head and neck cancer ([place]) 2019   s/p right facial basal cell carcinoma s/p resection with right partial mastectomy and partal rhinectomy with skin graft (06/13/17)  . HTN (hypertension)   . Iron deficiency anemia   . Tobacco abuse     Past Surgical History:  Procedure Laterality Date  . ANKLE CLOSED REDUCTION Right   . COLONOSCOPY N/A 05/31/2017   Procedure: COLONOSCOPY;  Surgeon: Daneil Dolin, MD;  Location: [facility];  Service: Endoscopy;  Laterality: N/A;  2:45pm  . POLYPECTOMY  05/31/2017   Procedure: POLYPECTOMY;  Surgeon: Daneil Dolin, MD;  Location: [facility];  Service: Endoscopy;;  .  TONSILLECTOMY       Prior to Admission medications   Medication Sig Start Date End Date Taking? Authorizing Provider  aspirin 81 MG chewable tablet Chew 1 tablet (81 mg total) by mouth daily. 04/07/17   Mikhail, Velta Addison, DO  atorvastatin (LIPITOR) 40 MG tablet Take 1 tablet (40 mg total) by mouth daily at 6 PM. 05/23/17   Derek Jack, MD  carvedilol (COREG) 12.5 MG tablet Take 1 tablet (12.5 mg total) by  mouth 2 (two) times daily with a meal. 05/23/17   Derek Jack, MD  cephALEXin (KEFLEX) 500 MG capsule Take 1 capsule (500 mg total) by mouth 4 (four) times daily. 06/26/17   Fredia Sorrow, MD  ferrous sulfate 325 (65 FE) MG tablet Take 1 tablet (325 mg total) by mouth 2 (two) times daily with a meal. 05/23/17   Derek Jack, MD  furosemide (LASIX) 40 MG tablet Take 1 tablet (40 mg total) by mouth daily. 06/27/17   Dhungel, Nishant, MD  potassium chloride SA (K-DUR,KLOR-CON) 20 MEQ tablet Take 1 tablet (20 mEq total) by mouth daily. 05/23/17   Derek Jack, MD    Inpatient Medications: Scheduled Meds: . aspirin  81 mg Oral Daily  . atorvastatin  40 mg Oral q1800  . carvedilol  12.5 mg Oral BID WC  . feeding supplement (ENSURE ENLIVE)  237 mL Oral BID BM  . ferrous sulfate  325 mg Oral BID WC  . heparin  5,000 Units Subcutaneous Q8H  . sodium chloride flush  3 mL Intravenous Q12H   Continuous Infusions: . sodium chloride 100 mL/hr at 07/05/17 0213  . cefTRIAXone (ROCEPHIN)  IV     PRN Meds: acetaminophen **OR** acetaminophen, albuterol, ondansetron **OR** ondansetron (ZOFRAN) IV  Allergies:   No Known Allergies  Social History:   Social History   Socioeconomic History  . Marital status: Married    Spouse name: Not on file  . Number of children: Not on file  . Years of education: Not on file  . Highest education level: Not on file  Occupational History  . Not on file  Social Needs  . Financial resource strain: Not on file  . Food insecurity:    Worry: Not on file    Inability: Not on file  . Transportation needs:    Medical: Not on file    Non-medical: Not on file  Tobacco Use  . Smoking status: Current Some Day Smoker    Packs/day: 0.10    Years: 40.00    Pack years: 4.00    Types: Cigarettes  . Smokeless tobacco: Never Used  Substance and Sexual Activity  . Alcohol use: Not Currently    Frequency: Never  . Drug use: Never  . Sexual  activity: Not on file  Lifestyle  . Physical activity:    Days per week: Not on file    Minutes per session: Not on file  . Stress: Not on file  Relationships  . Social connections:    Talks on phone: Not on file    Gets together: Not on file    Attends religious service: Not on file    Active member of club or organization: Not on file    Attends meetings of clubs or organizations: Not on file    Relationship status: Not on file  . Intimate partner violence:    Fear of current or ex partner: Not on file    Emotionally abused: Not on file    Physically abused: Not on file  Forced sexual activity: Not on file  Other Topics Concern  . Not on file  Social History Narrative  . Not on file    Family History:   Family History  Problem Relation Age of Onset  . Stroke Father   . Cirrhosis Mother   . Colon cancer Neg Hx    Family Status:  Family Status  Relation Name Status  . Father  Deceased  . Mother  Deceased  . Brother  Alive  . Son  Alive  . Neg Hx  (Not Specified)    ROS:  Please see the history of present illness.  All other ROS reviewed and negative.     Physical Exam/Data:   Vitals:   07/05/17 0204 07/05/17 0540 07/05/17 0920 07/05/17 0926  BP: 130/66 (!) 144/73  95/60  Pulse: 81 92  90  Resp: 11 17    Temp: 98.1 F (36.7 C) 98.4 F (36.9 C) 98.1 F (36.7 C)   TempSrc: Oral Oral Oral   SpO2: 99% 100%    Weight: 166 lb 7.2 oz (75.5 kg)     Height: 6' (1.829 m)       Intake/Output Summary (Last 24 hours) at 07/05/2017 1308 Last data filed at 07/05/2017 1100 Gross per 24 hour  Intake 920 ml  Output 351 ml  Net 569 ml   Filed Weights   07/04/17 1812 07/05/17 0204  Weight: 174 lb (78.9 kg) 166 lb 7.2 oz (75.5 kg)   Body mass index is 22.57 kg/m.   General: Well developed, well nourished, NAD Skin: Warm, dry, intact  Head: Normocephalic, right facial surgical site, clear, moist mucus membranes. Neck: Negative for carotid bruits. No  JVD Lungs:Clear to ausculation bilaterally. No wheezes, rales, or rhonchi. Breathing is unlabored. Cardiovascular: RRR with S1 S2. No murmurs, rubs or gallops Abdomen: Soft, non-tender, non-distended with normoactive bowel sounds. No obvious abdominal masses. MSK: Strength and tone appear normal for age. 5/5 in all extremities Extremities: No edema. No clubbing or cyanosis. DP/PT pulses 2+ bilaterally Neuro: Alert and oriented. No focal deficits. No facial asymmetry. MAE spontaneously. Psych: Responds to questions appropriately with normal affect.     EKG:  The EKG was personally reviewed and demonstrates: 07/04/2017 NSR HR 97 with T wave inversions in inferior and lateral leads comparable to prior tracings.  LVH accentuated Telemetry:  Telemetry was personally reviewed and demonstrates: 07/05/2017 NSR HR 87 with PVCs  Relevant CV Studies:  ECHO: Echocardiogram 04/02/2017: Study Conclusions  - Left ventricle: The cavity size was normal. Wall thickness was   increased in a pattern of moderate LVH. There was mild focal   basal hypertrophy of the septum. Systolic function was normal.   The estimated ejection fraction was in the range of 50% to 55%.   Wall motion was normal; there were no regional wall motion   abnormalities. Doppler parameters are consistent with abnormal   left ventricular relaxation (grade 1 diastolic dysfunction). - Mitral valve: Calcified annulus. There was mild regurgitation. - Pulmonary arteries: PA peak pressure: 35 mm Hg (S).  Impressions:  - Low normal to mildly reduced LV systolic function (EF 50);   moderate LVH; mild diastolic dysfunction; mild MR.   CATH: None  Laboratory Data:  Chemistry Recent Labs  Lab 07/04/17 1938 07/05/17 0424  NA 137 136  K 3.1* 3.2*  CL 99* 103  CO2 23 24  GLUCOSE 121* 113*  BUN 33* 40*  CREATININE 2.39* 2.55*  CALCIUM 8.3* 7.8*  GFRNONAA 27* 25*  GFRAA 32* 29*  ANIONGAP 15 9    Total Protein  Date Value Ref  Range Status  07/05/2017 5.7 (L) 6.5 - 8.1 g/dL Final  04/23/2011 7.3 6.4 - 8.2 g/dL Final   Albumin  Date Value Ref Range Status  07/05/2017 2.5 (L) 3.5 - 5.0 g/dL Final  04/23/2011 3.2 (L) 3.4 - 5.0 g/dL Final   AST  Date Value Ref Range Status  07/05/2017 72 (H) 15 - 41 U/L Final   SGOT(AST)  Date Value Ref Range Status  04/23/2011 64 (H) 15 - 37 Unit/L Final   ALT  Date Value Ref Range Status  07/05/2017 75 (H) 17 - 63 U/L Final   SGPT (ALT)  Date Value Ref Range Status  04/23/2011 70 U/L Final    Comment:    12-78 NOTE: NEW REFERENCE RANGE 12/24/2010    Alkaline Phosphatase  Date Value Ref Range Status  07/05/2017 64 38 - 126 U/L Final  04/23/2011 62 50 - 136 Unit/L Final   Total Bilirubin  Date Value Ref Range Status  07/05/2017 0.4 0.3 - 1.2 mg/dL Final   Bilirubin,Total  Date Value Ref Range Status  04/23/2011 1.0 0.2 - 1.0 mg/dL Final   Hematology Recent Labs  Lab 07/04/17 1938 07/05/17 0424  WBC 15.4* 14.2*  RBC 3.30* 2.93*  HGB 8.4* 7.5*  HCT 27.4* 24.1*  MCV 83.0 82.3  MCH 25.5* 25.6*  MCHC 30.7 31.1  RDW 17.9* 18.2*  PLT 273 249   Cardiac Enzymes Recent Labs  Lab 07/04/17 1939 07/05/17 0118 07/05/17 0424 07/05/17 1138  TROPONINI 0.11* 0.16* 0.18* 0.13*   No results for input(s): TROPIPOC in the last 168 hours.  BNP Recent Labs  Lab 07/05/17 0424  BNP 151.0*    DDimer No results for input(s): DDIMER in the last 168 hours. TSH:  Lab Results  Component Value Date   TSH 0.537 04/02/2017   Lipids: Lab Results  Component Value Date   CHOL 194 04/02/2017   HDL 42 04/02/2017   LDLCALC 142 (H) 04/02/2017   TRIG 49 04/02/2017   CHOLHDL 4.6 04/02/2017   HgbA1c: Lab Results  Component Value Date   HGBA1C 5.3 04/02/2017    Radiology/Studies:  Ct Head Wo Contrast  Result Date: 07/04/2017 CLINICAL DATA:  Initial evaluation for acute trauma, fall. History of prior facial tumor resection with reconstructive surgery. EXAM: CT  HEAD WITHOUT CONTRAST CT MAXILLOFACIAL WITHOUT CONTRAST TECHNIQUE: Multidetector CT imaging of the head and maxillofacial structures were performed using the standard protocol without intravenous contrast. Multiplanar CT image reconstructions of the maxillofacial structures were also generated. COMPARISON:  Prior CT from 04/03/2017. FINDINGS: CT HEAD FINDINGS Brain: Generalized age-related cerebral atrophy. Confluent hypodensity within the periventricular and deep white matter both cerebral hemispheres, consistent with advanced chronic small vessel ischemic change. Remote lacunar infarct present within the bilateral basal ganglia. No acute intracranial hemorrhage. No acute large vessel territory infarct. No mass lesion, midline shift or mass effect. No hydrocephalus. No extra-axial fluid collection. Vascular: No asymmetric hyperdense vessel. Calcified atherosclerosis present at the skull base Skull: No acute scalp soft tissue abnormality. Other: Mastoid air cells are clear. CT MAXILLOFACIAL FINDINGS Osseous: Postoperative changes from previous partial resection of the right maxilla. Interval placement of prosthetic right facial buttress implant. Inferior right nasal bone has been resected as well. Plate screw fixation at the right orbital floor. Previously identified mass at the right nasal labial fold has been resected with probable free flap placement. Scattered surgical clips within this  region. Scattered surgical clips present throughout this region as well as within the right submandibular space. Fat density with right maxillary sinus likely postsurgical. No acute fracture about the face. Mandible intact. Zygomatic arches intact. Right-to-left nasal septal deviation which remains intact. Poor dentition with scattered dental caries and periapical lucencies again noted. Orbits: Postoperative changes at the right orbital floor as above. Globes intact. No retro-orbital hematoma or other pathology. No acute orbital  fracture. Sinuses: Scattered mucosal thickening within the sphenoid and maxillary sinuses bilaterally. Soft tissues: No appreciable soft tissue injury about the face. Postsurgical changes at the right nasal labial fold. No definite residual mass. IMPRESSION: CT HEAD: 1. No acute intracranial abnormality. 2. Age-related cerebral atrophy with advanced chronic small vessel ischemic disease. CT MAXILLOFACIAL: 1. No acute maxillofacial injury identified. 2. Postoperative changes from prior right facial surgery for resection of right nasolabial fold mass as above. No definite recurrent mass identified. Electronically Signed   By: Jeannine Boga M.D.   On: 07/04/2017 21:53   Dg Chest Port 1 View  Result Date: 07/04/2017 CLINICAL DATA:  Acute onset of syncope. EXAM: PORTABLE CHEST 1 VIEW COMPARISON:  Chest radiograph and CTA of the chest performed 04/02/2017 FINDINGS: The lungs are well-aerated and clear. There is no evidence of focal opacification, pleural effusion or pneumothorax. The cardiomediastinal silhouette is within normal limits. No acute osseous abnormalities are seen. IMPRESSION: No acute cardiopulmonary process seen. Electronically Signed   By: Garald Balding M.D.   On: 07/04/2017 22:16   Ct Maxillofacial Wo Contrast  Result Date: 07/04/2017 CLINICAL DATA:  Initial evaluation for acute trauma, fall. History of prior facial tumor resection with reconstructive surgery. EXAM: CT HEAD WITHOUT CONTRAST CT MAXILLOFACIAL WITHOUT CONTRAST TECHNIQUE: Multidetector CT imaging of the head and maxillofacial structures were performed using the standard protocol without intravenous contrast. Multiplanar CT image reconstructions of the maxillofacial structures were also generated. COMPARISON:  Prior CT from 04/03/2017. FINDINGS: CT HEAD FINDINGS Brain: Generalized age-related cerebral atrophy. Confluent hypodensity within the periventricular and deep white matter both cerebral hemispheres, consistent with advanced  chronic small vessel ischemic change. Remote lacunar infarct present within the bilateral basal ganglia. No acute intracranial hemorrhage. No acute large vessel territory infarct. No mass lesion, midline shift or mass effect. No hydrocephalus. No extra-axial fluid collection. Vascular: No asymmetric hyperdense vessel. Calcified atherosclerosis present at the skull base Skull: No acute scalp soft tissue abnormality. Other: Mastoid air cells are clear. CT MAXILLOFACIAL FINDINGS Osseous: Postoperative changes from previous partial resection of the right maxilla. Interval placement of prosthetic right facial buttress implant. Inferior right nasal bone has been resected as well. Plate screw fixation at the right orbital floor. Previously identified mass at the right nasal labial fold has been resected with probable free flap placement. Scattered surgical clips within this region. Scattered surgical clips present throughout this region as well as within the right submandibular space. Fat density with right maxillary sinus likely postsurgical. No acute fracture about the face. Mandible intact. Zygomatic arches intact. Right-to-left nasal septal deviation which remains intact. Poor dentition with scattered dental caries and periapical lucencies again noted. Orbits: Postoperative changes at the right orbital floor as above. Globes intact. No retro-orbital hematoma or other pathology. No acute orbital fracture. Sinuses: Scattered mucosal thickening within the sphenoid and maxillary sinuses bilaterally. Soft tissues: No appreciable soft tissue injury about the face. Postsurgical changes at the right nasal labial fold. No definite residual mass. IMPRESSION: CT HEAD: 1. No acute intracranial abnormality. 2. Age-related cerebral atrophy with  advanced chronic small vessel ischemic disease. CT MAXILLOFACIAL: 1. No acute maxillofacial injury identified. 2. Postoperative changes from prior right facial surgery for resection of right  nasolabial fold mass as above. No definite recurrent mass identified. Electronically Signed   By: Jeannine Boga M.D.   On: 07/04/2017 21:53   Assessment and Plan:   1.Syncope, suspected secondary to orthostatic hypotension: -Positive orthostatics>>lying 156/68(89); sitting 131/59 (95); standing 95/60 (109) -Pt received 551ml NS bolus with IV hydration (143ml/hr) since admission with BP trending at 95/60>144/73>130/66 -Syncopal episode likely secondary to orthostatic hypotension given the patient has a history of this. Patient denies palpitations>> low suspicion of arrhythmia etiology  -Continue IV hydration -Continue home dose carvedilol 12.5 mg  2. Chronic diastolic heart failure:   -Last echocardiogram 04/02/17 with LVEF of 50-55% with NWMA and G1DD, moderate LVH and mild MR -Repeat echocardiogram for comparison to evaluate for wall motion abnormalities or decrease in LV function given elevated troponin -No s/s of fluid volume overload -Weight, 166lb today, 174lb on admission 07/04/17 -I&O, net positive 811ml today, 24H total output 125ml>>>pt received IV hydration secondary to orthostatic hypotension  -Strict I&O, daily weights  -Home dose Lasix on hold secondary to worsening renal function  3. Acute on chronic kidney disease stage II: -Creatinine, 2.55 today>>recent baseline appears to be in the 1.2 range -Will avoid nephrotoxic medications and follow closely with [facility]  -Possible patient with acute dehydration?  -Hydrate as tolerated  4. Elevated troponin: -Trop, 0.11>0.16>0.18 without c/o of chest pain or associated symptoms -EKG with NSR and anterolateral T wave inversions present on prior tracings. LVH appears changed from previous EKG  -Last echocardiogram 04/02/17 with LVEF 50-55% with NWMA and G1DD -Repeat echocardiogram this admission to evaluate for wall motion her LV function changes.  If abnormal can consider further ischemic cardiac work-up however given his  worsening renal function, not likely cardiac cath candidate at this point until renal function improved.  Could consider Lexiscan Myoview stress if needed -Continue ASA, atorvastatin, carvedilol  5. HTN: -Stable, 95/60>144/73>130/66 -Lasix on hold secondary to worsening Acute on chronic CKD -Continue carvedilol  6. Hypokalemia: -K+, 3.2 today, replaced with 46meq per primary team today  -Follow closely   7. Bilateral carotid stenosis: -Stable, Per bilateral carotid US 06/24/17 with bilateral carotid bifurcation and proximal ICA plaque resulting in 70-99% diameter right ICA stenosis, less than 50% diameter left ICA stenosis. Little change from previous study.  8. Positive UTI with recent indwelling cathter secondary to acute urinary retention: -Positive UA on admission, per primary team  -Patient with history of indwelling catheter followed by [facility]   For questions or updates, please contact [facility] Please consult [email or web address] for contact info under Cardiology/STEMI.   Lyndel Safe NP-C [facility] Pager: [phone] 07/05/2017 1:08 PM    Patient seen and examined. Agree with assessment and plan.  Michael [name] is a 62 year old gentleman who is status post recent resection of a right facial basal cell carcinoma requiring right partial mastectomy and partial rhinectomy with skin grafting at [facility] in May 2019.  He has a long-standing history of hypertension, tobacco use, and has previously documented chronic diastolic heart failure, carotid plaque deficiency anemia.  He was admitted following a syncopal spell most likely contributed by volume depletion/dehydration.  On presentation, he denied any prodrome of chest pressure or chest tightness.  His ECG revealed LVH with ST-T changes probably contributed by LV strain but ischemic changes cannot be completely excluded.  His exam was notable  for orthostatic hypotension which he received IV hydration.  Skin  graft is present on his face following his recent surgery.  There is a right carotid bruit.  Lungs reveal decreased breath sounds at the bases.  Rhythm was regular without ectopy.  There was a 1/6 systolic murmur.  Abdomen soft nontender.  Pulses were intact.  There was no significant edema.  An echo doppler study in March 2019 showed moderate LVH with focal basal septal hypertrophy.  EF was 50 to 55% and there was grade 1 diastolic dysfunction.  He did not have regional wall motion abnormalities.  Mild MR.  Amatory is notable for acute on chronic kidney disease with creatinine increased to 2.55 probably contributed by volume depletion.  He was found to have recent UTI with indwelling catheter secondary to acute urinary retention.  Suspect his mild troponin elevation is contributed by demand ischemia from his orthostasis and syncope.  Sitter follow-up echo Doppler study to reassess wall motion abnormality.  His long-standing tobacco history, ECG changes, noninvasive Lexiscan Myoview study may be stated to assess for potential ischemia.  We will continue with aspirin therapy, high potency statin therapy carotid plaque and continue with carvedilol.  Once fully hydrated, will need to continue to monitor for orthostatic hypotension present initiate support stockings and possible pharmacotherapy.    Troy Sine, MD, [facility] 07/05/2017 6:32 PM

## 2017-07-05 NOTE — ED Notes (Signed)
Tamala Julian, MD at bedside.

## 2017-07-05 NOTE — Progress Notes (Addendum)
Initial Nutrition Assessment  DOCUMENTATION CODES:   Not applicable  INTERVENTION:    Ensure Enlive po BID, each supplement provides 350 kcal and 20 grams of protein  NUTRITION DIAGNOSIS:   Increased nutrient needs related to chronic illness as evidenced by estimated needs  GOAL:   Patient will meet greater than or equal to 90% of their needs  MONITOR:   PO intake, Supplement acceptance, Labs, Skin, Weight trends, I & O's  REASON FOR ASSESSMENT:   Malnutrition Screening Tool  ASSESSMENT:   62 yo Male with PMH significant of right facial basal cell carcinoma s/p resection with right partial mastectomy and partial rhinectomy with skin graft placement on 06/13/2017, HTN, CHF, CKD; presented after having syncopal event.  RD spoke with pt at bedside. He is eating jello and watching TV. Reports his appetite is good. Not very happy he's on a Full Liquid diet. He states he was consuming 3 meals per day PTA.  Pt also endorses a 67 lb weight loss since his facial surgery in May, however, readings do not reflect this. He has Ensure Enlive supplements ordered but he didn't mention having any yet. Labs and medications reviewed. K 3.2 (L).  NUTRITION - FOCUSED PHYSICAL EXAM:  Deferred.  Diet Order:   Diet Order           Diet full liquid Room service appropriate? Yes; Fluid consistency: Thin  Diet effective now         EDUCATION NEEDS:   No education needs have been identified at this time  Skin:  Skin Assessment: Reviewed RN Assessment  Last BM:  6/4  Height:   Ht Readings from Last 1 Encounters:  07/05/17 6' (1.829 m)   Weight:   Wt Readings from Last 1 Encounters:  07/05/17 166 lb 7.2 oz (75.5 kg)   Wt Readings from Last 15 Encounters:  07/05/17 166 lb 7.2 oz (75.5 kg)  07/03/17 174 lb 9.6 oz (79.2 kg)  07/01/17 178 lb (80.7 kg)  06/26/17 178 lb (80.7 kg)  06/25/17 181 lb 14.1 oz (82.5 kg)  05/29/17 189 lb 9.6 oz (86 kg)  05/25/17 194 lb 9.6 oz (88.3 kg)   05/15/17 193 lb 1.6 oz (87.6 kg)  05/12/17 192 lb 1.6 oz (87.1 kg)  04/06/17 184 lb 9.6 oz (83.7 kg)   BMI:  Body mass index is 22.57 kg/m.  Estimated Nutritional Needs:   Kcal:  1800-2000  Protein:  90-105 gm  Fluid:  1.8-2.0 L  Arthur Holms, RD, LDN Pager #: [phone] After-Hours Pager #: [phone]

## 2017-07-05 NOTE — ED Notes (Signed)
ED Provider at bedside. 

## 2017-07-05 NOTE — Progress Notes (Signed)
[facility]                                                                              Patient Demographics  Michael [name], is a 62 y.o. male, DOB - 07-24-1955, CBJ:[id number]  Admit date - 07/04/2017   Admitting Physician Norval Morton, MD  Outpatient Primary MD for the patient is Patient, No Pcp Per  Outpatient specialists:   LOS - 0  days   Medical records reviewed and are as summarized below:    Chief Complaint  Patient presents with  . Loss of Consciousness       Brief summary   Patient is a 62 year old male with right facial basal cell carcinoma status post resection with skin graft placement on 06/13/2017, hypertension, chronic CHF, last EF 50 to 5%, anemia, CKD stage II presented with a syncopal episode.  Patient recalls going to sleep and waking up on the floor.  Reported having to crawl from his bedroom to the adjacent room and took him hours to reach phone.  EMS noted a large amount of blood and patient covered in feces.  Patient also reported diarrhea for last 1 week up to 7 bowel movements per day.  He was admitted for syncopal episode secondary to orthostatic hypotension from 5/25-5/26, has not followed with cardiology yet.  Patient was also seen for recurrent urinary retention at any pain on 6/1 and Foley catheter was placed, recommended to follow-up with [facility]. In ED troponin 0.1, creatinine 2.39 UA positive for UTI.  Patient was admitted for further work-up.  Assessment & Plan    Principal Problem:   Syncope and collapse with elevated troponins -Possibly due to orthostatic hypotension, dehydration, however has elevated troponins.  Currently no chest pain -Obtain serial cardiac enzymes, recent 2D echo showed EF of 50-55% -Cardiology consulted, follow recommendations   Active Problems: Catheter associated UTI -Previous urine culture 5/27 was noted to show no growth -Follow urine culture, currently on IV Rocephin  Diarrhea -Per  patient he has been having diarrhea for last 1 week up to 7 BMs in a day.  At the time of my encounter patient had a BM on the bed, greenish, watery, recent hospitalizations -C. difficile negative, will add Loperamide  Acute kidney injury superimposed on chronic kidney disease stage II -Baseline creatinine around 1.2, presented with creatinine of 2.39, likely due to dehydration, diarrhea, furosemide -Hold furosemide, continue gentle hydration -If no significant improvement by a.m., obtain renal ultrasound    Essential hypertension -Hold furosemide due to acute kidney injury, continue metoprolol  Hypokalemia -Replaced  Iron deficiency anemia -H&H appears close to baseline, had received 1 unit packed RBCs during previous admission  Diastolic CHF -Currently compensated, last EF 50 to 55% with grade 1 diastolic dysfunction -Holding furosemide, continue strict I's and O's  Retention with chronic indwelling Foley -Old Foley catheter was replaced in ED, continue, will need to follow-up with [facility]  Code Status: Full code DVT Prophylaxis: Heparin subcu Family Communication: Discussed in detail with the patient, all imaging results, lab results explained to the patient    Disposition Plan: Not medically ready  Time Spent  in minutes   35 minutes  Procedures:  None  Consultants:   Cardiology  Antimicrobials:   IV Rocephin   Medications  Scheduled Meds: . aspirin  81 mg Oral Daily  . atorvastatin  40 mg Oral q1800  . carvedilol  12.5 mg Oral BID WC  . feeding supplement (ENSURE ENLIVE)  237 mL Oral BID BM  . ferrous sulfate  325 mg Oral BID WC  . heparin  5,000 Units Subcutaneous Q8H  . sodium chloride flush  3 mL Intravenous Q12H   Continuous Infusions: . sodium chloride 100 mL/hr at 07/05/17 0213  . cefTRIAXone (ROCEPHIN)  IV     PRN Meds:.acetaminophen **OR** acetaminophen, albuterol, ondansetron **OR** ondansetron (ZOFRAN) IV   Antibiotics    Anti-infectives (From admission, onward)   Start     Dose/Rate Route Frequency Ordered Stop   07/05/17 2200  cefTRIAXone (ROCEPHIN) 1 g in sodium chloride 0.9 % 100 mL IVPB     1 g 200 mL/hr over 30 Minutes Intravenous Every 24 hours 07/05/17 0013     07/04/17 2345  cefTRIAXone (ROCEPHIN) 1 g in sodium chloride 0.9 % 100 mL IVPB     1 g 200 mL/hr over 30 Minutes Intravenous  Once 07/04/17 2332 07/05/17 0026        Subjective:   Michael [name] was seen and examined today.  No chest pain, feels tired, had a big bowel movement on the bed.  No dizziness, lightheadedness at the time of my encounter.  No abdominal pain.  No fevers or chills .    Objective:   Vitals:   07/05/17 0540 07/05/17 0920 07/05/17 0926 07/05/17 1334  BP: (!) 144/73  95/60 (!) 114/57  Pulse: 92  90 67  Resp: 17   20  Temp: 98.4 F (36.9 C) 98.1 F (36.7 C)  98.2 F (36.8 C)  TempSrc: Oral Oral  Oral  SpO2: 100%   99%  Weight:      Height:        Intake/Output Summary (Last 24 hours) at 07/05/2017 1341 Last data filed at 07/05/2017 1300 Gross per 24 hour  Intake 1160 ml  Output 351 ml  Net 809 ml     Wt Readings from Last 3 Encounters:  07/05/17 75.5 kg (166 lb 7.2 oz)  07/03/17 79.2 kg (174 lb 9.6 oz)  07/01/17 80.7 kg (178 lb)     Exam  General: Alert and oriented x 3, NAD  Eyes:   HEENT: right-sided facial deformity with skin flap, dried blood  Cardiovascular: S1 S2 auscultated, Regular rate and rhythm.  Respiratory: Clear to auscultation bilaterally, no wheezing, rales or rhonchi  Gastrointestinal: Soft, nontender, nondistended, + bowel sounds  Ext: no pedal edema bilaterally  Neuro: no new deficits  Musculoskeletal: No digital cyanosis, clubbing  Skin: No rashes  Psych: Normal affect and demeanor, alert and oriented x3    Data Reviewed:  I have personally reviewed following labs and imaging studies  Micro Results Recent Results (from the past 240 hour(s))  Urine  culture     Status: None   Collection Time: 06/26/17  2:55 PM  Result Value Ref Range Status   Specimen Description   Final    URINE, CLEAN CATCH Performed at [facility], [address] [place], [place] [address]    Special Requests   Final    NONE Performed at [facility], [address] [place], [place] [address]    Culture   Final    NO  GROWTH Performed at [facility], [place] [address] [place], [place] [address]    Report Status 06/28/2017 FINAL  Final  C difficile quick scan w PCR reflex     Status: None   Collection Time: 07/05/17  9:33 AM  Result Value Ref Range Status   C Diff antigen NEGATIVE NEGATIVE Final   C Diff toxin NEGATIVE NEGATIVE Final   C Diff interpretation No C. difficile detected.  Final    Comment: Performed at [facility], [place] [address] [place], [place] [address]    Radiology Reports Ct Head Wo Contrast  Result Date: 07/04/2017 CLINICAL DATA:  Initial evaluation for acute trauma, fall. History of prior facial tumor resection with reconstructive surgery. EXAM: CT HEAD WITHOUT CONTRAST CT MAXILLOFACIAL WITHOUT CONTRAST TECHNIQUE: Multidetector CT imaging of the head and maxillofacial structures were performed using the standard protocol without intravenous contrast. Multiplanar CT image reconstructions of the maxillofacial structures were also generated. COMPARISON:  Prior CT from 04/03/2017. FINDINGS: CT HEAD FINDINGS Brain: Generalized age-related cerebral atrophy. Confluent hypodensity within the periventricular and deep white matter both cerebral hemispheres, consistent with advanced chronic small vessel ischemic change. Remote lacunar infarct present within the bilateral basal ganglia. No acute intracranial hemorrhage. No acute large vessel territory infarct. No mass lesion, midline shift or mass effect. No hydrocephalus. No extra-axial fluid collection. Vascular: No asymmetric hyperdense vessel. Calcified atherosclerosis present at the skull  base Skull: No acute scalp soft tissue abnormality. Other: Mastoid air cells are clear. CT MAXILLOFACIAL FINDINGS Osseous: Postoperative changes from previous partial resection of the right maxilla. Interval placement of prosthetic right facial buttress implant. Inferior right nasal bone has been resected as well. Plate screw fixation at the right orbital floor. Previously identified mass at the right nasal labial fold has been resected with probable free flap placement. Scattered surgical clips within this region. Scattered surgical clips present throughout this region as well as within the right submandibular space. Fat density with right maxillary sinus likely postsurgical. No acute fracture about the face. Mandible intact. Zygomatic arches intact. Right-to-left nasal septal deviation which remains intact. Poor dentition with scattered dental caries and periapical lucencies again noted. Orbits: Postoperative changes at the right orbital floor as above. Globes intact. No retro-orbital hematoma or other pathology. No acute orbital fracture. Sinuses: Scattered mucosal thickening within the sphenoid and maxillary sinuses bilaterally. Soft tissues: No appreciable soft tissue injury about the face. Postsurgical changes at the right nasal labial fold. No definite residual mass. IMPRESSION: CT HEAD: 1. No acute intracranial abnormality. 2. Age-related cerebral atrophy with advanced chronic small vessel ischemic disease. CT MAXILLOFACIAL: 1. No acute maxillofacial injury identified. 2. Postoperative changes from prior right facial surgery for resection of right nasolabial fold mass as above. No definite recurrent mass identified. Electronically Signed   By: Jeannine Boga M.D.   On: 07/04/2017 21:53   US Carotid Bilateral  Result Date: 06/25/2017 CLINICAL DATA:  Syncope, vertigo. Hypertension, coronary artery disease, previous tobacco abuse. EXAM: BILATERAL CAROTID DUPLEX ULTRASOUND TECHNIQUE: Pearline Cables scale  imaging, color Doppler and duplex ultrasound was performed of bilateral carotid and vertebral arteries in the neck. COMPARISON:  04/04/2017 TECHNIQUE: Quantification of carotid stenosis is based on velocity parameters that correlate the residual internal carotid diameter with NASCET-based stenosis levels, using the diameter of the distal internal carotid lumen as the denominator for stenosis measurement. The following velocity measurements were obtained: PEAK SYSTOLIC/END DIASTOLIC RIGHT ICA:  337/91cm/sec CCA:                     16/10RU/EAV SYSTOLIC ICA/CCA RATIO:  3.5 ECA:                     309cm/sec LEFT ICA:                     135/40cm/sec CCA:                     409/81XB/JYN SYSTOLIC ICA/CCA RATIO:  1.1 ECA:                     171cm/sec FINDINGS: RIGHT CAROTID ARTERY: Eccentric calcified plaque in the bulb extending into proximal internal external carotid arteries. Elevated peak systolic velocities in the proximal ICA just distal to the plaque. Tortuous carotid system. Elsewhere normal waveforms and color Doppler signal. RIGHT VERTEBRAL ARTERY:  Normal flow direction and waveform. LEFT CAROTID ARTERY: Eccentric plaque in the distal common carotid artery, bulb, proximal internal and external carotid arteries. This results in at least mild proximal ICA stenosis. Normal waveforms and color Doppler signal. Mild tortuosity. LEFT VERTEBRAL ARTERY: Normal flow direction and waveform. IMPRESSION: 1. Bilateral carotid bifurcation and proximal ICA plaque resulting in 70-99% diameter right ICA stenosis, less than 50% diameter left ICA stenosis. Little change from previous study. 2.  Antegrade bilateral vertebral arterial flow. Electronically Signed   By: Lucrezia Europe M.D.   On: 06/25/2017 12:22   Dg Chest Port 1 View  Result Date: 07/04/2017 CLINICAL DATA:  Acute onset of syncope. EXAM: PORTABLE CHEST 1 VIEW COMPARISON:  Chest radiograph and CTA of the chest performed 04/02/2017 FINDINGS: The  lungs are well-aerated and clear. There is no evidence of focal opacification, pleural effusion or pneumothorax. The cardiomediastinal silhouette is within normal limits. No acute osseous abnormalities are seen. IMPRESSION: No acute cardiopulmonary process seen. Electronically Signed   By: Garald Balding M.D.   On: 07/04/2017 22:16   Ct Maxillofacial Wo Contrast  Result Date: 07/04/2017 CLINICAL DATA:  Initial evaluation for acute trauma, fall. History of prior facial tumor resection with reconstructive surgery. EXAM: CT HEAD WITHOUT CONTRAST CT MAXILLOFACIAL WITHOUT CONTRAST TECHNIQUE: Multidetector CT imaging of the head and maxillofacial structures were performed using the standard protocol without intravenous contrast. Multiplanar CT image reconstructions of the maxillofacial structures were also generated. COMPARISON:  Prior CT from 04/03/2017. FINDINGS: CT HEAD FINDINGS Brain: Generalized age-related cerebral atrophy. Confluent hypodensity within the periventricular and deep white matter both cerebral hemispheres, consistent with advanced chronic small vessel ischemic change. Remote lacunar infarct present within the bilateral basal ganglia. No acute intracranial hemorrhage. No acute large vessel territory infarct. No mass lesion, midline shift or mass effect. No hydrocephalus. No extra-axial fluid collection. Vascular: No asymmetric hyperdense vessel. Calcified atherosclerosis present at the skull base Skull: No acute scalp soft tissue abnormality. Other: Mastoid air cells are clear. CT MAXILLOFACIAL FINDINGS Osseous: Postoperative changes from previous partial resection of the right maxilla. Interval placement of prosthetic right facial buttress implant. Inferior right nasal bone has been resected as well. Plate screw fixation at the right orbital floor. Previously identified mass at the right nasal labial fold has been resected with probable free flap placement. Scattered surgical clips within this  region. Scattered surgical clips present throughout this region as well as within the right submandibular space. Fat density with right maxillary sinus likely postsurgical. No acute fracture about the  face. Mandible intact. Zygomatic arches intact. Right-to-left nasal septal deviation which remains intact. Poor dentition with scattered dental caries and periapical lucencies again noted. Orbits: Postoperative changes at the right orbital floor as above. Globes intact. No retro-orbital hematoma or other pathology. No acute orbital fracture. Sinuses: Scattered mucosal thickening within the sphenoid and maxillary sinuses bilaterally. Soft tissues: No appreciable soft tissue injury about the face. Postsurgical changes at the right nasal labial fold. No definite residual mass. IMPRESSION: CT HEAD: 1. No acute intracranial abnormality. 2. Age-related cerebral atrophy with advanced chronic small vessel ischemic disease. CT MAXILLOFACIAL: 1. No acute maxillofacial injury identified. 2. Postoperative changes from prior right facial surgery for resection of right nasolabial fold mass as above. No definite recurrent mass identified. Electronically Signed   By: Jeannine Boga M.D.   On: 07/04/2017 21:53    Lab Data:  CBC: Recent Labs  Lab 07/04/17 1938 07/05/17 0424  WBC 15.4* 14.2*  NEUTROABS 12.1*  --   HGB 8.4* 7.5*  HCT 27.4* 24.1*  MCV 83.0 82.3  PLT 273 846   Basic Metabolic Panel: Recent Labs  Lab 07/04/17 1938 07/05/17 0424  NA 137 136  K 3.1* 3.2*  CL 99* 103  CO2 23 24  GLUCOSE 121* 113*  BUN 33* 40*  CREATININE 2.39* 2.55*  CALCIUM 8.3* 7.8*  MG  --  1.8   GFR: Estimated Creatinine Clearance: 31.7 mL/min (A) (by C-G formula based on SCr of 2.55 mg/dL (H)). Liver Function Tests: Recent Labs  Lab 07/05/17 0424  AST 72*  ALT 75*  ALKPHOS 64  BILITOT 0.4  PROT 5.7*  ALBUMIN 2.5*   No results for input(s): LIPASE, AMYLASE in the last 168 hours. No results for input(s):  AMMONIA in the last 168 hours. Coagulation Profile: No results for input(s): INR, PROTIME in the last 168 hours. Cardiac Enzymes: Recent Labs  Lab 07/04/17 1938 07/04/17 1939 07/05/17 0118 07/05/17 0424 07/05/17 1138  CKTOTAL 177  --   --   --   --   TROPONINI  --  0.11* 0.16* 0.18* 0.13*   BNP (last 3 results) No results for input(s): PROBNP in the last 8760 hours. HbA1C: No results for input(s): HGBA1C in the last 72 hours. CBG: No results for input(s): GLUCAP in the last 168 hours. Lipid Profile: No results for input(s): CHOL, HDL, LDLCALC, TRIG, CHOLHDL, LDLDIRECT in the last 72 hours. Thyroid Function Tests: No results for input(s): TSH, T4TOTAL, FREET4, T3FREE, THYROIDAB in the last 72 hours. Anemia Panel: No results for input(s): VITAMINB12, FOLATE, FERRITIN, TIBC, IRON, RETICCTPCT in the last 72 hours. Urine analysis:    Component Value Date/Time   COLORURINE AMBER (A) 07/04/2017 2216   APPEARANCEUR CLOUDY (A) 07/04/2017 2216   APPEARANCEUR Cloudy 04/23/2011 1044   LABSPEC 1.015 07/04/2017 2216   LABSPEC 1.018 04/23/2011 1044   PHURINE 5.0 07/04/2017 2216   GLUCOSEU NEGATIVE 07/04/2017 2216   GLUCOSEU Negative 04/23/2011 1044   HGBUR LARGE (A) 07/04/2017 2216   BILIRUBINUR NEGATIVE 07/04/2017 2216   BILIRUBINUR Negative 04/23/2011 1044   [place] 07/04/2017 2216   PROTEINUR 100 (A) 07/04/2017 2216   NITRITE NEGATIVE 07/04/2017 2216   LEUKOCYTESUR LARGE (A) 07/04/2017 2216   LEUKOCYTESUR Trace 04/23/2011 1044     Ripudeep Rai M.D. [facility] 07/05/2017, 1:41 PM  Pager: [phone] Between 7am to 7pm - call Pager - [phone]  After 7pm go to [email or web address] - password [email or web address]  Call night coverage person covering after 7pm

## 2017-07-06 ENCOUNTER — Inpatient Hospital Stay (HOSPITAL_COMMUNITY): Payer: Medicaid Other

## 2017-07-06 ENCOUNTER — Encounter (HOSPITAL_COMMUNITY): Payer: Self-pay

## 2017-07-06 ENCOUNTER — Ambulatory Visit: Payer: Self-pay | Admitting: Vascular Surgery

## 2017-07-06 DIAGNOSIS — T83510A Infection and inflammatory reaction due to cystostomy catheter, initial encounter: Secondary | ICD-10-CM

## 2017-07-06 DIAGNOSIS — I361 Nonrheumatic tricuspid (valve) insufficiency: Secondary | ICD-10-CM

## 2017-07-06 DIAGNOSIS — K591 Functional diarrhea: Secondary | ICD-10-CM

## 2017-07-06 LAB — ECHOCARDIOGRAM COMPLETE
Height: 72 in
WEIGHTICAEL: 2723.12 [oz_av]

## 2017-07-06 LAB — FOLATE: Folate: 6.6 ng/mL

## 2017-07-06 LAB — BASIC METABOLIC PANEL WITH GFR
Anion gap: 10 (ref 5–15)
BUN: 31 mg/dL — ABNORMAL HIGH (ref 6–20)
CO2: 23 mmol/L (ref 22–32)
Calcium: 8.2 mg/dL — ABNORMAL LOW (ref 8.9–10.3)
Chloride: 106 mmol/L (ref 101–111)
Creatinine, Ser: 1.57 mg/dL — ABNORMAL HIGH (ref 0.61–1.24)
GFR calc Af Amer: 52 mL/min — ABNORMAL LOW
GFR calc non Af Amer: 45 mL/min — ABNORMAL LOW
Glucose, Bld: 105 mg/dL — ABNORMAL HIGH (ref 65–99)
Potassium: 3.8 mmol/L (ref 3.5–5.1)
Sodium: 139 mmol/L (ref 135–145)

## 2017-07-06 LAB — CBC
HEMATOCRIT: 19.6 % — AB (ref 39.0–52.0)
HEMOGLOBIN: 6 g/dL — AB (ref 13.0–17.0)
MCH: 25.5 pg — AB (ref 26.0–34.0)
MCHC: 30.6 g/dL (ref 30.0–36.0)
MCV: 83.4 fL (ref 78.0–100.0)
PLATELETS: 200 10*3/uL (ref 150–400)
RBC: 2.35 MIL/uL — AB (ref 4.22–5.81)
RDW: 18.3 % — ABNORMAL HIGH (ref 11.5–15.5)
WBC: 10.4 10*3/uL (ref 4.0–10.5)

## 2017-07-06 LAB — RETICULOCYTES
RBC.: 2.35 MIL/uL — ABNORMAL LOW (ref 4.22–5.81)
Retic Count, Absolute: 23.5 K/uL (ref 19.0–186.0)
Retic Ct Pct: 1 % (ref 0.4–3.1)

## 2017-07-06 LAB — PREPARE RBC (CROSSMATCH)

## 2017-07-06 LAB — IRON AND TIBC
Iron: 26 ug/dL — ABNORMAL LOW (ref 45–182)
Saturation Ratios: 13 % — ABNORMAL LOW (ref 17.9–39.5)
TIBC: 206 ug/dL — ABNORMAL LOW (ref 250–450)
UIBC: 180 ug/dL

## 2017-07-06 LAB — SAVE SMEAR

## 2017-07-06 LAB — LACTATE DEHYDROGENASE: LDH: 154 U/L (ref 98–192)

## 2017-07-06 LAB — VITAMIN B12: Vitamin B-12: 477 pg/mL (ref 180–914)

## 2017-07-06 LAB — FERRITIN: FERRITIN: 235 ng/mL (ref 24–336)

## 2017-07-06 MED ORDER — SODIUM CHLORIDE 0.9 % IV SOLN
Freq: Once | INTRAVENOUS | Status: AC
  Administered 2017-07-06: 15:00:00 via INTRAVENOUS

## 2017-07-06 MED ORDER — LOPERAMIDE HCL 2 MG PO CAPS: 2.0000 mg | ORAL_CAPSULE | Freq: Three times a day (TID) | ORAL | Status: DC | PRN

## 2017-07-06 MED ORDER — SODIUM CHLORIDE 0.9 % IV SOLN
510.0000 mg | Freq: Once | INTRAVENOUS | Status: AC
  Administered 2017-07-06: 510 mg via INTRAVENOUS
  Filled 2017-07-06: qty 17

## 2017-07-06 NOTE — Progress Notes (Signed)
PT note Evaluation completed and to be placed in chart.  Pt was orthostatic as below: Orthostatic BPs  Supine 131/63, 67 bpm  Sitting 116/51, 68 bpm  Standing 96/54, 78 bpm  Standing after 3 min 103/67, 76 bpm  Pt did report dizziness with position changes.  Once pt back in bed, BP 127/65 with HR 68 bpm.  Will continue PT as pt tolerates.  See full note for full eval.  Thanks. [place] [phone] (pager)

## 2017-07-06 NOTE — Progress Notes (Signed)
  Echocardiogram 2D Echocardiogram has been performed.  Michael [name] Michael [name] 07/06/2017, 11:22 AM

## 2017-07-06 NOTE — Progress Notes (Addendum)
Progress Note  Patient Name: Michael [name] Date of Encounter: 07/06/2017  Primary Cardiologist: No primary care provider on file.  Subjective   No specific complaints, but just doesn't feel well.   Inpatient Medications    Scheduled Meds: . aspirin  81 mg Oral Daily  . atorvastatin  40 mg Oral q1800  . carvedilol  12.5 mg Oral BID WC  . feeding supplement (ENSURE ENLIVE)  237 mL Oral BID BM  . ferrous sulfate  325 mg Oral BID WC  . heparin  5,000 Units Subcutaneous Q8H  . sodium chloride flush  3 mL Intravenous Q12H   Continuous Infusions: . sodium chloride 100 mL/hr at 07/06/17 0604  . cefTRIAXone (ROCEPHIN)  IV Stopped (07/05/17 2243)   PRN Meds: acetaminophen **OR** acetaminophen, albuterol, loperamide, ondansetron **OR** ondansetron (ZOFRAN) IV   Vital Signs    Vitals:   07/06/17 0432 07/06/17 0654 07/06/17 0659 07/06/17 0700  BP: 119/60 131/67 (!) 130/48 127/61  Pulse: 64     Resp: 16 17 14 19   Temp: 98 F (36.7 C)     TempSrc: Oral     SpO2: 100%     Weight: 170 lb 3.1 oz (77.2 kg)     Height:        Intake/Output Summary (Last 24 hours) at 07/06/2017 1100 Last data filed at 07/06/2017 0842 Gross per 24 hour  Intake 3155 ml  Output 1225 ml  Net 1930 ml   Filed Weights   07/04/17 1812 07/05/17 0204 07/06/17 0432  Weight: 174 lb (78.9 kg) 166 lb 7.2 oz (75.5 kg) 170 lb 3.1 oz (77.2 kg)    Telemetry    SR - Personally Reviewed  Physical Exam   General: Well developed, well nourished, male appearing in no acute distress. Head: Normocephalic, right facial surgical site scar, dried blood from fall.  Neck: Supple, no JVD. Lungs:  Resp regular and unlabored, CTA. Heart: RRR, S1, S2, no S3, S4, or murmur; no rub. Abdomen: Soft, non-tender, non-distended with normoactive bowel sounds.  Extremities: No clubbing, cyanosis, edema. Distal pedal pulses are 2+ bilaterally. Neuro: Alert and oriented X 3. Moves all extremities spontaneously. Psych: Normal  affect.  Labs    Chemistry Recent Labs  Lab 07/04/17 1938 07/05/17 0424 07/06/17 0311  NA 137 136 139  K 3.1* 3.2* 3.8  CL 99* 103 106  CO2 23 24 23   GLUCOSE 121* 113* 105*  BUN 33* 40* 31*  CREATININE 2.39* 2.55* 1.57*  CALCIUM 8.3* 7.8* 8.2*  PROT  --  5.7*  --   ALBUMIN  --  2.5*  --   AST  --  72*  --   ALT  --  75*  --   ALKPHOS  --  64  --   BILITOT  --  0.4  --   GFRNONAA 27* 25* 45*  GFRAA 32* 29* 52*  ANIONGAP 15 9 10      Hematology Recent Labs  Lab 07/04/17 1938 07/05/17 0424  WBC 15.4* 14.2*  RBC 3.30* 2.93*  HGB 8.4* 7.5*  HCT 27.4* 24.1*  MCV 83.0 82.3  MCH 25.5* 25.6*  MCHC 30.7 31.1  RDW 17.9* 18.2*  PLT 273 249    Cardiac Enzymes Recent Labs  Lab 07/04/17 1939 07/05/17 0118 07/05/17 0424 07/05/17 1138  TROPONINI 0.11* 0.16* 0.18* 0.13*   No results for input(s): TROPIPOC in the last 168 hours.   BNP Recent Labs  Lab 07/05/17 0424  BNP 151.0*     DDimer  No results for input(s): DDIMER in the last 168 hours.    Radiology    Ct Head Wo Contrast  Result Date: 07/04/2017 CLINICAL DATA:  Initial evaluation for acute trauma, fall. History of prior facial tumor resection with reconstructive surgery. EXAM: CT HEAD WITHOUT CONTRAST CT MAXILLOFACIAL WITHOUT CONTRAST TECHNIQUE: Multidetector CT imaging of the head and maxillofacial structures were performed using the standard protocol without intravenous contrast. Multiplanar CT image reconstructions of the maxillofacial structures were also generated. COMPARISON:  Prior CT from 04/03/2017. FINDINGS: CT HEAD FINDINGS Brain: Generalized age-related cerebral atrophy. Confluent hypodensity within the periventricular and deep white matter both cerebral hemispheres, consistent with advanced chronic small vessel ischemic change. Remote lacunar infarct present within the bilateral basal ganglia. No acute intracranial hemorrhage. No acute large vessel territory infarct. No mass lesion, midline shift or  mass effect. No hydrocephalus. No extra-axial fluid collection. Vascular: No asymmetric hyperdense vessel. Calcified atherosclerosis present at the skull base Skull: No acute scalp soft tissue abnormality. Other: Mastoid air cells are clear. CT MAXILLOFACIAL FINDINGS Osseous: Postoperative changes from previous partial resection of the right maxilla. Interval placement of prosthetic right facial buttress implant. Inferior right nasal bone has been resected as well. Plate screw fixation at the right orbital floor. Previously identified mass at the right nasal labial fold has been resected with probable free flap placement. Scattered surgical clips within this region. Scattered surgical clips present throughout this region as well as within the right submandibular space. Fat density with right maxillary sinus likely postsurgical. No acute fracture about the face. Mandible intact. Zygomatic arches intact. Right-to-left nasal septal deviation which remains intact. Poor dentition with scattered dental caries and periapical lucencies again noted. Orbits: Postoperative changes at the right orbital floor as above. Globes intact. No retro-orbital hematoma or other pathology. No acute orbital fracture. Sinuses: Scattered mucosal thickening within the sphenoid and maxillary sinuses bilaterally. Soft tissues: No appreciable soft tissue injury about the face. Postsurgical changes at the right nasal labial fold. No definite residual mass. IMPRESSION: CT HEAD: 1. No acute intracranial abnormality. 2. Age-related cerebral atrophy with advanced chronic small vessel ischemic disease. CT MAXILLOFACIAL: 1. No acute maxillofacial injury identified. 2. Postoperative changes from prior right facial surgery for resection of right nasolabial fold mass as above. No definite recurrent mass identified. Electronically Signed   By: Jeannine Boga M.D.   On: 07/04/2017 21:53   Dg Chest Port 1 View  Result Date: 07/04/2017 CLINICAL DATA:   Acute onset of syncope. EXAM: PORTABLE CHEST 1 VIEW COMPARISON:  Chest radiograph and CTA of the chest performed 04/02/2017 FINDINGS: The lungs are well-aerated and clear. There is no evidence of focal opacification, pleural effusion or pneumothorax. The cardiomediastinal silhouette is within normal limits. No acute osseous abnormalities are seen. IMPRESSION: No acute cardiopulmonary process seen. Electronically Signed   By: Garald Balding M.D.   On: 07/04/2017 22:16   Ct Maxillofacial Wo Contrast  Result Date: 07/04/2017 CLINICAL DATA:  Initial evaluation for acute trauma, fall. History of prior facial tumor resection with reconstructive surgery. EXAM: CT HEAD WITHOUT CONTRAST CT MAXILLOFACIAL WITHOUT CONTRAST TECHNIQUE: Multidetector CT imaging of the head and maxillofacial structures were performed using the standard protocol without intravenous contrast. Multiplanar CT image reconstructions of the maxillofacial structures were also generated. COMPARISON:  Prior CT from 04/03/2017. FINDINGS: CT HEAD FINDINGS Brain: Generalized age-related cerebral atrophy. Confluent hypodensity within the periventricular and deep white matter both cerebral hemispheres, consistent with advanced chronic small vessel ischemic change. Remote lacunar infarct present  within the bilateral basal ganglia. No acute intracranial hemorrhage. No acute large vessel territory infarct. No mass lesion, midline shift or mass effect. No hydrocephalus. No extra-axial fluid collection. Vascular: No asymmetric hyperdense vessel. Calcified atherosclerosis present at the skull base Skull: No acute scalp soft tissue abnormality. Other: Mastoid air cells are clear. CT MAXILLOFACIAL FINDINGS Osseous: Postoperative changes from previous partial resection of the right maxilla. Interval placement of prosthetic right facial buttress implant. Inferior right nasal bone has been resected as well. Plate screw fixation at the right orbital floor. Previously  identified mass at the right nasal labial fold has been resected with probable free flap placement. Scattered surgical clips within this region. Scattered surgical clips present throughout this region as well as within the right submandibular space. Fat density with right maxillary sinus likely postsurgical. No acute fracture about the face. Mandible intact. Zygomatic arches intact. Right-to-left nasal septal deviation which remains intact. Poor dentition with scattered dental caries and periapical lucencies again noted. Orbits: Postoperative changes at the right orbital floor as above. Globes intact. No retro-orbital hematoma or other pathology. No acute orbital fracture. Sinuses: Scattered mucosal thickening within the sphenoid and maxillary sinuses bilaterally. Soft tissues: No appreciable soft tissue injury about the face. Postsurgical changes at the right nasal labial fold. No definite residual mass. IMPRESSION: CT HEAD: 1. No acute intracranial abnormality. 2. Age-related cerebral atrophy with advanced chronic small vessel ischemic disease. CT MAXILLOFACIAL: 1. No acute maxillofacial injury identified. 2. Postoperative changes from prior right facial surgery for resection of right nasolabial fold mass as above. No definite recurrent mass identified. Electronically Signed   By: Jeannine Boga M.D.   On: 07/04/2017 21:53    Cardiac Studies   TTE: pending  Patient Profile     62 y.o. male with a hx of right facial basal cell carcinoma s/p resection with right partial mastectomy and partal rhinectomy with skin graft (06/13/17), HTN, tobacco use, chronic diastolic CHF with LVEF 94-85%, R internal carotid stenosis, iron deficiency anemia and CKD stage II who was seen for the evaluation of elevated troponin at the request of Dr. Tamala Julian with internal medicine.  Assessment & Plan    1.Syncope, suspected secondary to orthostatic hypotension: -Positive orthostatics>>lying 156/68(89); sitting 131/59  (95); standing 95/60 (109) on admission -Syncopal episode likely secondary to orthostatic hypotension given the patient has a history of this, as well as dehydration. Patient denies palpitations>> low suspicion of arrhythmia etiology. -may need to reduce his home dose carvedilol 12.5 mg given recurrent syncope  2. Chronic diastolic heart failure:   -Last echocardiogram 04/02/17 with LVEF of 50-55% with NWMA and G1DD, moderate LVH and mild MR -No s/s of fluid volume overload -I&O, net positive 2.4L>>>pt received IV hydration secondary to orthostatic hypotension  -Strict I&O, daily weights  -Home dose Lasix on hold  3. Acute on chronic kidney disease stage II: -Creatinine, 2.55>>1.57 today,recent baseline appears to be in the 1.2 range -Will avoid nephrotoxic medications and follow closely with [facility]  -Likely dehydration  4. Elevated troponin: -Trop, 0.11>0.16>0.18 without c/o of chest pain or associated symptoms -EKG with NSR and anterolateral T wave inversions present on prior tracings. LVH appears changed from previous EKG  -Last echocardiogram 04/02/17 with LVEF 50-55% with NWMA and G1DD -Repeat echocardiogram pending to evaluate for wall motion her LV function changes.  If abnormal can consider further ischemic cardiac work-up. Could consider Lexiscan Myoview stress if needed -Continue ASA, atorvastatin, carvedilol  5. HTN: -Stable, 95/60>144/73>130/66 -Lasix on hold secondary to  worsening Acute on chronic CKD -Continue carvedilol  6. Hypokalemia: -resolved  7. Bilateral carotid stenosis: -Stable, Per bilateral carotid US 06/24/17 with bilateral carotid bifurcation and proximal ICA plaque resulting in 70-99% diameter right ICA stenosis, less than 50% diameter left ICA stenosis. Little change from previous study.  8. Positive UTI with recent indwelling cathter secondary to acute urinary retention: -Positive UA on admission, per primary team  -Patient with history of  indwelling catheter followed by [facility]  Signed, Reino Bellis, NP  07/06/2017, 11:00 AM  Pager # [phone]   For questions or updates, please contact [place] Please consult [email or web address] for contact info under Cardiology/STEMI.   Patient seen and examined. Agree with assessment and plan. Renal fxn improved following hydration. Cr 1.55 today.  Echo today shows EF 55-60%; no wall motion abnormalities, Mitral annular Ca and moderate LA dilation. Abnl tissue doppler. Suspect demand ischemia in mild troponin elevation. Feels well; no chest pain or dyspnea.  Consider outpatient lexiscan in future if develops symptoms. Will sign off.    Troy Sine, MD, [facility] 07/06/2017 12:46 PM

## 2017-07-06 NOTE — Progress Notes (Addendum)
[facility]                                                                              Patient Demographics  Michael [name], is a 62 y.o. male, DOB - Aug 18, 1955, RJJ:[id number]  Admit date - 07/04/2017   Admitting Physician Norval Morton, MD  Outpatient Primary MD for the patient is Patient, No Pcp Per  Outpatient specialists:   LOS - 1  days   Medical records reviewed and are as summarized below:    Chief Complaint  Patient presents with  . Loss of Consciousness       Brief summary   Patient is a 62 year old male with right facial basal cell carcinoma status post resection with skin graft placement on 06/13/2017, hypertension, chronic CHF, last EF 50 to 5%, anemia, CKD stage II presented with a syncopal episode.  Patient recalls going to sleep and waking up on the floor.  Reported having to crawl from his bedroom to the adjacent room and took him hours to reach phone.  EMS noted a large amount of blood and patient covered in feces.  Patient also reported diarrhea for last 1 week up to 7 bowel movements per day.  He was admitted for syncopal episode secondary to orthostatic hypotension from 5/25-5/26, has not followed with cardiology yet.  Patient was also seen for recurrent urinary retention at any pain on 6/1 and Foley catheter was placed, recommended to follow-up with [facility]. In ED troponin 0.1, creatinine 2.39 UA positive for UTI.  Patient was admitted for further work-up.  Assessment & Plan    Principal Problem:   Syncope and collapse with elevated troponins, orthostatic hypotension -Possibly due to orthostatic hypotension, dehydration, however has elevated troponins.  Currently no chest pain. -Repeat orthostatics today -Continue Coreg, Lasix currently on hold  Active Problems: Catheter associated UTI, Klebsiella -Urine culture showed more than 100,000 colonies of Klebsiella pneumonia, continue IV Rocephin, follow sensitivities    Diarrhea -Per patient he has been having diarrhea for last 1 week up to 7 BMs in a day.  At the time of my encounter patient had a BM on the bed, greenish, watery, recent hospitalizations -C. difficile negative, added loperamide, diarrhea improving   Acute kidney injury superimposed on chronic kidney disease stage II -Baseline creatinine around 1.2, presented with creatinine of 2.39, likely due to dehydration, diarrhea, furosemide, peaked at 2.5 -Lasix held, continue gentle hydration, creatinine improving to 1.5 today     Essential hypertension -BP stable, continue metoprolol, hold Lasix due to acute kidney injury   Iron deficiency anemia -H&H appears close to baseline, had received 1 unit packed RBCs during previous admission -Repeat H&H, transfuse if less than 7.5 -Obtain FOBT, anemia profile, if iron deficiency, will give 1 dose of IV Venofer Addendum:  Hb 6.0, no obvious bleeding. Will transfuse 2 units pRBC, 1 unit IV venofer. - check LDH, haptoglobin, smear - reviewed the chart, patient has been following hematology oncology, Dr Delton Coombes, last visit on 07/03/17 for anemia and was given venofer on 5/25.  - hold ASA heparin sq, place on SCD's   Chronic diastolic CHF -Currently compensated, last  EF 50 to 55% with grade 1 diastolic dysfunction -Holding furosemide, continue strict I's and O's  Retention with chronic indwelling Foley -Old Foley catheter was replaced in ED, continue, will need to follow-up with urology outpatient  Code Status: Full code DVT Prophylaxis: Heparin subcu Family Communication: Discussed in detail with the patient, all imaging results, lab results explained to the patient    Disposition Plan: Not medically ready  Time Spent in minutes   35 minutes  Procedures:  None  Consultants:   Cardiology  Antimicrobials:   IV Rocephin   Medications  Scheduled Meds: . aspirin  81 mg Oral Daily  . atorvastatin  40 mg Oral q1800  . carvedilol   12.5 mg Oral BID WC  . feeding supplement (ENSURE ENLIVE)  237 mL Oral BID BM  . ferrous sulfate  325 mg Oral BID WC  . heparin  5,000 Units Subcutaneous Q8H  . sodium chloride flush  3 mL Intravenous Q12H   Continuous Infusions: . sodium chloride 100 mL/hr at 07/06/17 0604  . cefTRIAXone (ROCEPHIN)  IV Stopped (07/05/17 2243)   PRN Meds:.acetaminophen **OR** acetaminophen, albuterol, loperamide, ondansetron **OR** ondansetron (ZOFRAN) IV   Antibiotics   Anti-infectives (From admission, onward)   Start     Dose/Rate Route Frequency Ordered Stop   07/05/17 2200  cefTRIAXone (ROCEPHIN) 1 g in sodium chloride 0.9 % 100 mL IVPB     1 g 200 mL/hr over 30 Minutes Intravenous Every 24 hours 07/05/17 0013     07/04/17 2345  cefTRIAXone (ROCEPHIN) 1 g in sodium chloride 0.9 % 100 mL IVPB     1 g 200 mL/hr over 30 Minutes Intravenous  Once 07/04/17 2332 07/05/17 0026        Subjective:   Michael [name] was seen and examined today.  Does not feel too good otherwise no specific complaints no chest pain or shortness of breath.  No dizziness lightheadedness while lying in the bed.  States diarrhea is improving.  No fevers or chills, nausea vomiting, new weakness.  Objective:   Vitals:   07/06/17 0432 07/06/17 0654 07/06/17 0659 07/06/17 0700  BP: 119/60 131/67 (!) 130/48 127/61  Pulse: 64     Resp: 16 17 14 19   Temp: 98 F (36.7 C)     TempSrc: Oral     SpO2: 100%     Weight: 77.2 kg (170 lb 3.1 oz)     Height:        Intake/Output Summary (Last 24 hours) at 07/06/2017 1221 Last data filed at 07/06/2017 0842 Gross per 24 hour  Intake 3155 ml  Output 1225 ml  Net 1930 ml     Wt Readings from Last 3 Encounters:  07/06/17 77.2 kg (170 lb 3.1 oz)  07/03/17 79.2 kg (174 lb 9.6 oz)  07/01/17 80.7 kg (178 lb)     Exam   General: Alert and oriented x 3, NAD  Eyes:  HEENT: right-sided facial deformity due to recent surgery  Cardiovascular: S1 S2 auscultated, Regular rate  and rhythm. No pedal edema b/l  Respiratory: Clear to auscultation bilaterally, no wheezing, rales or rhonchi  Gastrointestinal: Soft, nontender, nondistended, + bowel sounds  Ext: no pedal edema bilaterally  Neuro: no new deficits  Musculoskeletal: No digital cyanosis, clubbing  Skin: No rashes  Psych: Normal affect and demeanor, alert and oriented x3    Data Reviewed:  I have personally reviewed following labs and imaging studies  Micro Results Recent Results (from the past 240 hour(s))  Urine culture     Status: None   Collection Time: 06/26/17  2:55 PM  Result Value Ref Range Status   Specimen Description   Final    URINE, CLEAN CATCH Performed at [facility], [address], [place], [place] [address]    Special Requests   Final    NONE Performed at [facility], [address] [place], [place] [address]    Culture   Final    NO GROWTH Performed at [facility], [place] [address], [place], [place] [address]    Report Status 06/28/2017 FINAL  Final  Urine culture     Status: Abnormal (Preliminary result)   Collection Time: 07/04/17 10:16 PM  Result Value Ref Range Status   Specimen Description URINE, CATHETERIZED  Final   Special Requests   Final    NONE Performed at [facility], [place] [address], [place], [place] [address]    Culture >=100,000 COLONIES/mL KLEBSIELLA PNEUMONIAE (A)  Final   Report Status PENDING  Incomplete  C difficile quick scan w PCR reflex     Status: None   Collection Time: 07/05/17  9:33 AM  Result Value Ref Range Status   C Diff antigen NEGATIVE NEGATIVE Final   C Diff toxin NEGATIVE NEGATIVE Final   C Diff interpretation No C. difficile detected.  Final    Comment: Performed at [facility], [place] [address] [place], [place] [address]    Radiology Reports Ct Head Wo Contrast  Result Date: 07/04/2017 CLINICAL DATA:  Initial evaluation for acute trauma, fall. History of prior facial tumor resection with reconstructive  surgery. EXAM: CT HEAD WITHOUT CONTRAST CT MAXILLOFACIAL WITHOUT CONTRAST TECHNIQUE: Multidetector CT imaging of the head and maxillofacial structures were performed using the standard protocol without intravenous contrast. Multiplanar CT image reconstructions of the maxillofacial structures were also generated. COMPARISON:  Prior CT from 04/03/2017. FINDINGS: CT HEAD FINDINGS Brain: Generalized age-related cerebral atrophy. Confluent hypodensity within the periventricular and deep white matter both cerebral hemispheres, consistent with advanced chronic small vessel ischemic change. Remote lacunar infarct present within the bilateral basal ganglia. No acute intracranial hemorrhage. No acute large vessel territory infarct. No mass lesion, midline shift or mass effect. No hydrocephalus. No extra-axial fluid collection. Vascular: No asymmetric hyperdense vessel. Calcified atherosclerosis present at the skull base Skull: No acute scalp soft tissue abnormality. Other: Mastoid air cells are clear. CT MAXILLOFACIAL FINDINGS Osseous: Postoperative changes from previous partial resection of the right maxilla. Interval placement of prosthetic right facial buttress implant. Inferior right nasal bone has been resected as well. Plate screw fixation at the right orbital floor. Previously identified mass at the right nasal labial fold has been resected with probable free flap placement. Scattered surgical clips within this region. Scattered surgical clips present throughout this region as well as within the right submandibular space. Fat density with right maxillary sinus likely postsurgical. No acute fracture about the face. Mandible intact. Zygomatic arches intact. Right-to-left nasal septal deviation which remains intact. Poor dentition with scattered dental caries and periapical lucencies again noted. Orbits: Postoperative changes at the right orbital floor as above. Globes intact. No retro-orbital hematoma or other pathology.  No acute orbital fracture. Sinuses: Scattered mucosal thickening within the sphenoid and maxillary sinuses bilaterally. Soft tissues: No appreciable soft tissue injury about the face. Postsurgical changes at the right nasal labial fold. No definite residual mass. IMPRESSION: CT HEAD: 1. No acute intracranial abnormality. 2. Age-related cerebral atrophy with advanced chronic small vessel  ischemic disease. CT MAXILLOFACIAL: 1. No acute maxillofacial injury identified. 2. Postoperative changes from prior right facial surgery for resection of right nasolabial fold mass as above. No definite recurrent mass identified. Electronically Signed   By: Jeannine Boga M.D.   On: 07/04/2017 21:53   US Carotid Bilateral  Result Date: 06/25/2017 CLINICAL DATA:  Syncope, vertigo. Hypertension, coronary artery disease, previous tobacco abuse. EXAM: BILATERAL CAROTID DUPLEX ULTRASOUND TECHNIQUE: Pearline Cables scale imaging, color Doppler and duplex ultrasound was performed of bilateral carotid and vertebral arteries in the neck. COMPARISON:  04/04/2017 TECHNIQUE: Quantification of carotid stenosis is based on velocity parameters that correlate the residual internal carotid diameter with NASCET-based stenosis levels, using the diameter of the distal internal carotid lumen as the denominator for stenosis measurement. The following velocity measurements were obtained: PEAK SYSTOLIC/END DIASTOLIC RIGHT ICA:                     337/91cm/sec CCA:                     17/51WC/HEN SYSTOLIC ICA/CCA RATIO:  3.5 ECA:                     309cm/sec LEFT ICA:                     135/40cm/sec CCA:                     277/82UM/PNT SYSTOLIC ICA/CCA RATIO:  1.1 ECA:                     171cm/sec FINDINGS: RIGHT CAROTID ARTERY: Eccentric calcified plaque in the bulb extending into proximal internal external carotid arteries. Elevated peak systolic velocities in the proximal ICA just distal to the plaque. Tortuous carotid system. Elsewhere normal  waveforms and color Doppler signal. RIGHT VERTEBRAL ARTERY:  Normal flow direction and waveform. LEFT CAROTID ARTERY: Eccentric plaque in the distal common carotid artery, bulb, proximal internal and external carotid arteries. This results in at least mild proximal ICA stenosis. Normal waveforms and color Doppler signal. Mild tortuosity. LEFT VERTEBRAL ARTERY: Normal flow direction and waveform. IMPRESSION: 1. Bilateral carotid bifurcation and proximal ICA plaque resulting in 70-99% diameter right ICA stenosis, less than 50% diameter left ICA stenosis. Little change from previous study. 2.  Antegrade bilateral vertebral arterial flow. Electronically Signed   By: Lucrezia Europe M.D.   On: 06/25/2017 12:22   Dg Chest Port 1 View  Result Date: 07/04/2017 CLINICAL DATA:  Acute onset of syncope. EXAM: PORTABLE CHEST 1 VIEW COMPARISON:  Chest radiograph and CTA of the chest performed 04/02/2017 FINDINGS: The lungs are well-aerated and clear. There is no evidence of focal opacification, pleural effusion or pneumothorax. The cardiomediastinal silhouette is within normal limits. No acute osseous abnormalities are seen. IMPRESSION: No acute cardiopulmonary process seen. Electronically Signed   By: Garald Balding M.D.   On: 07/04/2017 22:16   Ct Maxillofacial Wo Contrast  Result Date: 07/04/2017 CLINICAL DATA:  Initial evaluation for acute trauma, fall. History of prior facial tumor resection with reconstructive surgery. EXAM: CT HEAD WITHOUT CONTRAST CT MAXILLOFACIAL WITHOUT CONTRAST TECHNIQUE: Multidetector CT imaging of the head and maxillofacial structures were performed using the standard protocol without intravenous contrast. Multiplanar CT image reconstructions of the maxillofacial structures were also generated. COMPARISON:  Prior CT from 04/03/2017. FINDINGS: CT HEAD FINDINGS Brain: Generalized age-related cerebral atrophy. Confluent hypodensity within the periventricular and deep  white matter both cerebral  hemispheres, consistent with advanced chronic small vessel ischemic change. Remote lacunar infarct present within the bilateral basal ganglia. No acute intracranial hemorrhage. No acute large vessel territory infarct. No mass lesion, midline shift or mass effect. No hydrocephalus. No extra-axial fluid collection. Vascular: No asymmetric hyperdense vessel. Calcified atherosclerosis present at the skull base Skull: No acute scalp soft tissue abnormality. Other: Mastoid air cells are clear. CT MAXILLOFACIAL FINDINGS Osseous: Postoperative changes from previous partial resection of the right maxilla. Interval placement of prosthetic right facial buttress implant. Inferior right nasal bone has been resected as well. Plate screw fixation at the right orbital floor. Previously identified mass at the right nasal labial fold has been resected with probable free flap placement. Scattered surgical clips within this region. Scattered surgical clips present throughout this region as well as within the right submandibular space. Fat density with right maxillary sinus likely postsurgical. No acute fracture about the face. Mandible intact. Zygomatic arches intact. Right-to-left nasal septal deviation which remains intact. Poor dentition with scattered dental caries and periapical lucencies again noted. Orbits: Postoperative changes at the right orbital floor as above. Globes intact. No retro-orbital hematoma or other pathology. No acute orbital fracture. Sinuses: Scattered mucosal thickening within the sphenoid and maxillary sinuses bilaterally. Soft tissues: No appreciable soft tissue injury about the face. Postsurgical changes at the right nasal labial fold. No definite residual mass. IMPRESSION: CT HEAD: 1. No acute intracranial abnormality. 2. Age-related cerebral atrophy with advanced chronic small vessel ischemic disease. CT MAXILLOFACIAL: 1. No acute maxillofacial injury identified. 2. Postoperative changes from prior right  facial surgery for resection of right nasolabial fold mass as above. No definite recurrent mass identified. Electronically Signed   By: Jeannine Boga M.D.   On: 07/04/2017 21:53    Lab Data:  CBC: Recent Labs  Lab 07/04/17 1938 07/05/17 0424  WBC 15.4* 14.2*  NEUTROABS 12.1*  --   HGB 8.4* 7.5*  HCT 27.4* 24.1*  MCV 83.0 82.3  PLT 273 956   Basic Metabolic Panel: Recent Labs  Lab 07/04/17 1938 07/05/17 0424 07/06/17 0311  NA 137 136 139  K 3.1* 3.2* 3.8  CL 99* 103 106  CO2 23 24 23   GLUCOSE 121* 113* 105*  BUN 33* 40* 31*  CREATININE 2.39* 2.55* 1.57*  CALCIUM 8.3* 7.8* 8.2*  MG  --  1.8  --    GFR: Estimated Creatinine Clearance: 52.6 mL/min (A) (by C-G formula based on SCr of 1.57 mg/dL (H)). Liver Function Tests: Recent Labs  Lab 07/05/17 0424  AST 72*  ALT 75*  ALKPHOS 64  BILITOT 0.4  PROT 5.7*  ALBUMIN 2.5*   No results for input(s): LIPASE, AMYLASE in the last 168 hours. No results for input(s): AMMONIA in the last 168 hours. Coagulation Profile: No results for input(s): INR, PROTIME in the last 168 hours. Cardiac Enzymes: Recent Labs  Lab 07/04/17 1938 07/04/17 1939 07/05/17 0118 07/05/17 0424 07/05/17 1138  CKTOTAL 177  --   --   --   --   TROPONINI  --  0.11* 0.16* 0.18* 0.13*   BNP (last 3 results) No results for input(s): PROBNP in the last 8760 hours. HbA1C: No results for input(s): HGBA1C in the last 72 hours. CBG: No results for input(s): GLUCAP in the last 168 hours. Lipid Profile: No results for input(s): CHOL, HDL, LDLCALC, TRIG, CHOLHDL, LDLDIRECT in the last 72 hours. Thyroid Function Tests: No results for input(s): TSH, T4TOTAL, FREET4, T3FREE, THYROIDAB in  the last 72 hours. Anemia Panel: No results for input(s): VITAMINB12, FOLATE, FERRITIN, TIBC, IRON, RETICCTPCT in the last 72 hours. Urine analysis:    Component Value Date/Time   COLORURINE AMBER (A) 07/04/2017 2216   APPEARANCEUR CLOUDY (A) 07/04/2017 2216    APPEARANCEUR Cloudy 04/23/2011 1044   LABSPEC 1.015 07/04/2017 2216   LABSPEC 1.018 04/23/2011 1044   PHURINE 5.0 07/04/2017 2216   GLUCOSEU NEGATIVE 07/04/2017 2216   GLUCOSEU Negative 04/23/2011 1044   HGBUR LARGE (A) 07/04/2017 2216   BILIRUBINUR NEGATIVE 07/04/2017 2216   BILIRUBINUR Negative 04/23/2011 1044   [place] 07/04/2017 2216   PROTEINUR 100 (A) 07/04/2017 2216   NITRITE NEGATIVE 07/04/2017 2216   LEUKOCYTESUR LARGE (A) 07/04/2017 2216   LEUKOCYTESUR Trace 04/23/2011 1044     Katty Fretwell M.D. [facility] 07/06/2017, 12:21 PM  Pager: [phone] Between 7am to 7pm - call Pager - [phone]  After 7pm go to [email or web address] - password [email or web address]  Call night coverage person covering after 7pm

## 2017-07-06 NOTE — Progress Notes (Signed)
CRITICAL VALUE ALERT  Critical Value:  Hemoglobin 6.0  Date & Time Notied:  07/06/17 13:12  Provider Notified: Dr. Tana Coast  Orders Received/Actions taken: 2 Units PRBC

## 2017-07-06 NOTE — Evaluation (Signed)
Physical Therapy Evaluation Patient Details Name: Michael [name] MRN: [id number] DOB: [birth year] Today's Date: 07/06/2017   History of Present Illness  Patient is a 62 year old male with right facial basal cell carcinoma status post resection with skin graft placement on 06/13/2017, hypertension, chronic CHF, last EF 50 to 5%, anemia, CKD stage II presented with a syncopal episode.  Clinical Impression  Pt admitted with above diagnosis. Pt currently with functional limitations due to the deficits listed below (see PT Problem List). Pt was able to ambulate in room.  Orthostatic BP's taken and pt was orthostatic.  Will follow acutely.  Feel that pt should get a RW for home as he reports this is not his first fall.   Pt will benefit from skilled PT to increase their independence and safety with mobility to allow discharge to the venue listed below.    Orthostatic BPs  Supine 131/63, 67 bpm  Sitting 116/51, 68 bpm  Standing 96/54, 78 bpm  Standing after 3 min 103/67, 76 bpm  Pt did report dizziness with position changes.  Once pt back in bed, BP 127/65 with HR 68 bpm.      Follow Up Recommendations Home health PT;Supervision - Intermittent    Equipment Recommendations  Rolling walker with 5" wheels    Recommendations for Other Services       Precautions / Restrictions Precautions Precautions: Fall Restrictions Weight Bearing Restrictions: No      Mobility  Bed Mobility Overal bed mobility: Independent                Transfers Overall transfer level: Independent                  Ambulation/Gait Ambulation/Gait assistance: Min guard Ambulation Distance (Feet): 80 Feet Assistive device: IV Pole;None Gait Pattern/deviations: Step-through pattern;Decreased stride length;Drifts right/left   Gait velocity interpretation: 1.31 - 2.62 ft/sec, indicative of limited community ambulator General Gait Details: Pt was able to walk in room but needed 1 UE support to steady.   Mild dizziness reported.   Stairs            Wheelchair Mobility    Modified Rankin (Stroke Patients Only)       Balance Overall balance assessment: Needs assistance Sitting-balance support: No upper extremity supported;Feet supported Sitting balance-Leahy Scale: Fair     Standing balance support: Single extremity supported;During functional activity Standing balance-Leahy Scale: Poor Standing balance comment: relies on at least 1 UE support                             Pertinent Vitals/Pain Pain Assessment: No/denies pain    Home Living Family/patient expects to be discharged to:: Private residence Living Arrangements: Alone Available Help at Discharge: Available PRN/intermittently Type of Home: House Home Access: Ramped entrance     Home Layout: One level Home Equipment: None      Prior Function Level of Independence: Independent         Comments: Reports he sponge bathed PTA since recent facial surgery.      Hand Dominance        Extremity/Trunk Assessment   Upper Extremity Assessment Upper Extremity Assessment: Defer to OT evaluation    Lower Extremity Assessment Lower Extremity Assessment: Generalized weakness    Cervical / Trunk Assessment Cervical / Trunk Assessment: Normal  Communication      Cognition Arousal/Alertness: Awake/alert Behavior During Therapy: WFL for tasks assessed/performed Overall Cognitive Status: Within Functional Limits for  tasks assessed                                        General Comments      Exercises     Assessment/Plan    PT Assessment Patient needs continued PT services  PT Problem List Decreased strength;Decreased activity tolerance;Decreased balance;Decreased mobility;Decreased knowledge of use of DME;Decreased safety awareness;Decreased knowledge of precautions       PT Treatment Interventions DME instruction;Gait training;Functional mobility training;Therapeutic  activities;Therapeutic exercise;Balance training;Patient/family education    PT Goals (Current goals can be found in the Care Plan section)  Acute Rehab PT Goals Patient Stated Goal: to go home PT Goal Formulation: With patient Time For Goal Achievement: 07/20/17 Potential to Achieve Goals: Good    Frequency Min 3X/week   Barriers to discharge Decreased caregiver support      Co-evaluation               AM-PAC PT "6 Clicks" Daily Activity  Outcome Measure Difficulty turning over in bed (including adjusting bedclothes, sheets and blankets)?: None Difficulty moving from lying on back to sitting on the side of the bed? : None Difficulty sitting down on and standing up from a chair with arms (e.g., wheelchair, bedside commode, etc,.)?: None Help needed moving to and from a bed to chair (including a wheelchair)?: A Little Help needed walking in hospital room?: A Little Help needed climbing 3-5 steps with a railing? : A Little 6 Click Score: 21    End of Session Equipment Utilized During Treatment: Gait belt Activity Tolerance: Patient limited by fatigue Patient left: in bed;with call bell/phone within reach;with bed alarm set Nurse Communication: Mobility status PT Visit Diagnosis: Unsteadiness on feet (R26.81);Muscle weakness (generalized) (M62.81)    Time: 0940-7680 PT Time Calculation (min) (ACUTE ONLY): 31 min   Charges:   PT Evaluation $PT Eval Moderate Complexity: 1 Mod PT Treatments $Gait Training: 8-22 mins   PT G Codes:        Sinthia Karabin,PT Acute Rehabilitation [phone] (pager)   Denice [place] 07/06/2017, 1:51 PM

## 2017-07-07 ENCOUNTER — Other Ambulatory Visit (HOSPITAL_COMMUNITY): Payer: Self-pay

## 2017-07-07 DIAGNOSIS — D500 Iron deficiency anemia secondary to blood loss (chronic): Secondary | ICD-10-CM

## 2017-07-07 LAB — CBC
HCT: 25.4 % — ABNORMAL LOW (ref 39.0–52.0)
Hemoglobin: 8.1 g/dL — ABNORMAL LOW (ref 13.0–17.0)
MCH: 26.7 pg (ref 26.0–34.0)
MCHC: 31.9 g/dL (ref 30.0–36.0)
MCV: 83.8 fL (ref 78.0–100.0)
PLATELETS: 237 10*3/uL (ref 150–400)
RBC: 3.03 MIL/uL — ABNORMAL LOW (ref 4.22–5.81)
RDW: 16.6 % — AB (ref 11.5–15.5)
WBC: 10.4 10*3/uL (ref 4.0–10.5)

## 2017-07-07 LAB — BPAM RBC
Blood Product Expiration Date: 201907052359
Blood Product Expiration Date: 201907052359
ISSUE DATE / TIME: 201906061520
ISSUE DATE / TIME: 201906061827
UNIT TYPE AND RH: 5100
Unit Type and Rh: 5100

## 2017-07-07 LAB — BASIC METABOLIC PANEL
Anion gap: 7 (ref 5–15)
BUN: 16 mg/dL (ref 6–20)
CALCIUM: 8.1 mg/dL — AB (ref 8.9–10.3)
CHLORIDE: 109 mmol/L (ref 101–111)
CO2: 25 mmol/L (ref 22–32)
Creatinine, Ser: 1.29 mg/dL — ABNORMAL HIGH (ref 0.61–1.24)
GFR calc Af Amer: >60 mL/min (ref >60)
GFR calc non Af Amer: 57 mL/min — ABNORMAL LOW (ref >60)
GLUCOSE: 93 mg/dL (ref 65–99)
Potassium: 2.9 mmol/L — ABNORMAL LOW (ref 3.5–5.1)
Sodium: 141 mmol/L (ref 135–145)

## 2017-07-07 LAB — HEMOGLOBIN AND HEMATOCRIT, BLOOD
HCT: 26.4 % — ABNORMAL LOW (ref 39.0–52.0)
HEMOGLOBIN: 8.2 g/dL — AB (ref 13.0–17.0)

## 2017-07-07 LAB — URINE CULTURE: Culture: >=100000 — AB

## 2017-07-07 LAB — TYPE AND SCREEN
ABO/RH(D): O POS
ANTIBODY SCREEN: NEGATIVE
UNIT DIVISION: 0
Unit division: 0

## 2017-07-07 LAB — POTASSIUM: Potassium: 3.5 mmol/L (ref 3.5–5.1)

## 2017-07-07 LAB — HAPTOGLOBIN: HAPTOGLOBIN: 273 mg/dL — AB (ref 34–200)

## 2017-07-07 MED ORDER — POTASSIUM CHLORIDE 10 MEQ/100ML IV SOLN
10.0000 meq | INTRAVENOUS | Status: AC
  Administered 2017-07-07 (×3): 10 meq via INTRAVENOUS
  Filled 2017-07-07 (×2): qty 100

## 2017-07-07 MED ORDER — AMLODIPINE BESYLATE 5 MG PO TABS
5.0000 mg | ORAL_TABLET | Freq: Every day | ORAL | Status: DC
  Administered 2017-07-07: 5 mg via ORAL
  Filled 2017-07-07: qty 1

## 2017-07-07 MED ORDER — POTASSIUM CHLORIDE CRYS ER 20 MEQ PO TBCR
30.0000 meq | EXTENDED_RELEASE_TABLET | ORAL | Status: AC
  Administered 2017-07-07 (×2): 30 meq via ORAL
  Filled 2017-07-07 (×2): qty 1

## 2017-07-07 MED ORDER — HYDRALAZINE HCL 20 MG/ML IJ SOLN
10.0000 mg | Freq: Four times a day (QID) | INTRAMUSCULAR | Status: DC | PRN
  Administered 2017-07-07: 10 mg via INTRAVENOUS
  Filled 2017-07-07: qty 1

## 2017-07-07 MED ORDER — ONDANSETRON 4 MG PO TBDP: 4.0000 mg | ORAL_TABLET | Freq: Three times a day (TID) | ORAL | 0 refills | Status: DC | PRN

## 2017-07-07 MED ORDER — CEPHALEXIN 500 MG PO CAPS: 500.0000 mg | ORAL_CAPSULE | Freq: Three times a day (TID) | ORAL | 0 refills | Status: AC

## 2017-07-07 MED ORDER — LOPERAMIDE HCL 2 MG PO CAPS: 2.0000 mg | ORAL_CAPSULE | Freq: Three times a day (TID) | ORAL | 0 refills | Status: DC | PRN

## 2017-07-07 MED ORDER — AMLODIPINE BESYLATE 5 MG PO TABS: 5.0000 mg | ORAL_TABLET | Freq: Every day | ORAL | 3 refills | Status: DC

## 2017-07-07 MED ORDER — CEPHALEXIN 500 MG PO CAPS: 500.0000 mg | ORAL_CAPSULE | Freq: Two times a day (BID) | ORAL | Status: DC

## 2017-07-07 MED ORDER — FERROUS SULFATE 325 (65 FE) MG PO TABS: 325.0000 mg | ORAL_TABLET | Freq: Two times a day (BID) | ORAL | 2 refills | Status: DC

## 2017-07-07 MED ORDER — CEPHALEXIN 500 MG PO CAPS
500.0000 mg | ORAL_CAPSULE | Freq: Three times a day (TID) | ORAL | Status: DC
  Administered 2017-07-07: 500 mg via ORAL
  Filled 2017-07-07: qty 1

## 2017-07-07 NOTE — Progress Notes (Signed)
[name] to be D/C'd Home per MD order. Discussed with the patient and all questions fully answered.    IV catheter discontinued intact. Site without signs and symptoms of complications. Dressing and pressure applied.  An After Visit Summary was printed and given to the patient.  Patient to be escorted via [facility], and D/C home via taxi  [place] Numbers  07/07/2017 6:12 PM

## 2017-07-07 NOTE — Care Management Note (Signed)
Case Management Note Marvetta Gibbons RN, BSN Unit 4E-Case Manager [phone]  Patient Details  Name: Michael [name] MRN: [id number] Date of Birth: 07-18-1955  Subjective/Objective:  Pt admitted s/p fall (syncope)                  Action/Plan: PTA pt lived at home alone, was active with [facility], per PT eval pt could benefit from [place] also- spoke with pt at bedside- pt reports that Alvis Lemmings has been following him at home once/week for several weeks now. Confirmed address and phone with pt - call made to [facility] with [facility] and confirmed pt is active with them. Will resume services with [facility] for HHRN/PT (pt is not eligible for any additional services as he in uninsured). MD to placed orders for transition home. Pt voices he does have a ride home.  Expected Discharge Date:  07/07/17               Expected Discharge Plan:  [place]  In-House Referral:  NA  Discharge planning Services  CM Consult  Post Acute Care Choice:  Home Health, Resumption of Svcs/PTA Provider Choice offered to:  Patient  DME Arranged:    DME Agency:     HH Arranged:  RN, PT [place] Agency:  [facility]  Status of Service:  Completed, signed off  If discussed at [place] of Stay Meetings, dates discussed:    Discharge Disposition: home/home health   Additional Comments:  Dawayne Patricia, RN 07/07/2017, 11:12 AM

## 2017-07-07 NOTE — Clinical Social Work Note (Signed)
Cab voucher approved by Surveyor, quantity of social work. CSW confirmed address with patient. Cab voucher filled out and given to RN.  CSW signing off.  Dayton Scrape, [place]

## 2017-07-07 NOTE — Discharge Summary (Signed)
Physician Discharge Summary   Patient ID: Morry Veiga MRN: [id number] DOB/AGE: [birth year] 63 y.o.  Admit date: 07/04/2017 Discharge date: 07/07/2017  Primary Care Physician:  Patient, No Pcp Per   Recommendations for Outpatient Follow-up:  1. Follow up with PCP in 1-2 weeks  Home Health: Home health PT OT, RN, home health aide Equipment/Devices: None  Discharge Condition: stable  CODE STATUS: FULL   Diet recommendation: Regular diet   Discharge Diagnoses:    . Syncope and collapse . Essential hypertension . Anemia . Elevated troponin . Hypokalemia . Acute renal failure superimposed on chronic kidney disease ([place]) . Catheter-associated urinary tract infection ([place]), Klebsiella UTI . Diarrhea, possible viral gastroenteritis   Consults:  Cardiology    Allergies:  No Known Allergies   DISCHARGE MEDICATIONS: Allergies as of 07/07/2017   No Known Allergies     Medication List    STOP taking these medications   aspirin 81 MG chewable tablet   furosemide 40 MG tablet Commonly known as:  LASIX     TAKE these medications   amLODipine 5 MG tablet Commonly known as:  NORVASC Take 1 tablet (5 mg total) by mouth daily.   atorvastatin 40 MG tablet Commonly known as:  LIPITOR Take 1 tablet (40 mg total) by mouth daily at 6 PM.   carvedilol 12.5 MG tablet Commonly known as:  COREG Take 1 tablet (12.5 mg total) by mouth 2 (two) times daily with a meal.   cephALEXin 500 MG capsule Commonly known as:  KEFLEX Take 1 capsule (500 mg total) by mouth 3 (three) times daily for 4 days.   ferrous sulfate 325 (65 FE) MG tablet Take 1 tablet (325 mg total) by mouth 2 (two) times daily with a meal.   finasteride 5 MG tablet Commonly known as:  PROSCAR Take 5 mg by mouth daily.   loperamide 2 MG capsule Commonly known as:  IMODIUM Take 1 capsule (2 mg total) by mouth 3 (three) times daily as needed for diarrhea or loose stools.   ondansetron 4 MG disintegrating  tablet Commonly known as:  ZOFRAN ODT Take 1 tablet (4 mg total) by mouth every 8 (eight) hours as needed for nausea or vomiting.        Brief H and P: For complete details please refer to admission H and P, but in brief Patient is a 62 year old male with right facial basal cell carcinoma status post resection with skin graft placement on 06/13/2017, hypertension, chronic CHF, last EF 50 to 5%, anemia, CKD stage II presented with a syncopal episode.  Patient recalls going to sleep and waking up on the floor.  Reported having to crawl from his bedroom to the adjacent room and took him hours to reach phone.  EMS noted a large amount of blood and patient covered in feces.  Patient also reported diarrhea for last 1 week up to 7 bowel movements per day.  He was admitted for syncopal episode secondary to orthostatic hypotension from 5/25-5/26, has not followed with cardiology yet.  Patient was also seen for recurrent urinary retention at any pain on 6/1 and Foley catheter was placed, recommended to follow-up with [facility]. In ED troponin 0.1, creatinine 2.39 UA positive for UTI.  Patient was admitted for further work-up.    Hospital Course:   Syncope and collapse with elevated troponins, orthostatic hypotension -Possibly due to orthostatic hypotension, dehydration, however has elevated troponins.  Currently no chest pain. -Continue Coreg, Lasix placed on hold -Lying BP 189/80,  placed on Norvasc 5 mg daily, compression stockings   Catheter associated UTI, Klebsiella -Urine culture showed more than 100,000 colonies of Klebsiella pneumonia -Placed on IV Rocephin, patient received IV Rocephin for 3 days, placed on oral Keflex for 4 more days  Diarrhea: Likely viral gastroenteritis -Per patient he has been having diarrhea for last 1 week up to 7 BMs in a day.  At the time of my encounter patient had a BM on the bed, greenish, watery, recent hospitalizations -C. difficile negative, added  loperamide, diarrhea improving   Acute kidney injury superimposed on chronic kidney disease stage II -Baseline creatinine around 1.2, presented with creatinine of 2.39, likely due to dehydration, diarrhea, furosemide, peaked at 2.5 -Lasix was held, creatinine 1.2 at the time of discharge.    Essential hypertension -Supine BP elevated, continue beta-blocker, added Norvasc 5 mg daily  Iron deficiency anemia -Hemoglobin 8.4 at the time of admission, trended down to 6.0, probably also has component of hemodilution due to IV fluid hydration.   - had received 1 unit packed RBCs during previous admission, transfused 2 units packed RBCs. -Anemia profile showed iron saturation of 13, FE 26, gave 1 unit dose of IV Venofer.  LDH normal, reticulocyte count normal and no obvious bleeding.  FOBT is pending. - reviewed the chart, patient has been following hematology oncology, Dr Delton Coombes, last visit on 07/03/17 for anemia and was given venofer on 5/25.  -Patient will follow-up with his hematologist in 1 week.  He had outpatient colonoscopy on 05/31/17 showed multiple large polyps otherwise no malignancy.  Patient follows Dr. Gala Romney, recommended to follow-up for possible endoscopy when feasible given his recent facial surgery.  Chronic diastolic CHF -Currently compensated, last EF 50 to 55% with grade 1 diastolic dysfunction  Retention with chronic indwelling Foley -Old Foley catheter was replaced in ED, continue, will need to follow-up with urology outpatient    Day of Discharge S: Currently doing well, no chest pain or shortness of breath  BP (!) 189/80 (BP Location: Left Arm)   Pulse 69   Temp 98 F (36.7 C) (Oral)   Resp 18   Ht 6' (1.829 m)   Wt 77.5 kg (170 lb 13.7 oz)   SpO2 98%   BMI 23.17 kg/m   Physical Exam: General: Alert and awake oriented x3 not in any acute distress. HEENT: anicteric sclera, pupils reactive to light and accommodation, facial deformity due to recent  surgery CVS: S1-S2 clear no murmur rubs or gallops Chest: clear to auscultation bilaterally, no wheezing rales or rhonchi Abdomen: soft nontender, nondistended, normal bowel sounds Extremities: no cyanosis, clubbing or edema noted bilaterally Neuro: Cranial nerves II-XII intact, no focal neurological deficits   The results of significant diagnostics from this hospitalization (including imaging, microbiology, ancillary and laboratory) are listed below for reference.      Procedures/Studies:  Ct Head Wo Contrast  Result Date: 07/04/2017 CLINICAL DATA:  Initial evaluation for acute trauma, fall. History of prior facial tumor resection with reconstructive surgery. EXAM: CT HEAD WITHOUT CONTRAST CT MAXILLOFACIAL WITHOUT CONTRAST TECHNIQUE: Multidetector CT imaging of the head and maxillofacial structures were performed using the standard protocol without intravenous contrast. Multiplanar CT image reconstructions of the maxillofacial structures were also generated. COMPARISON:  Prior CT from 04/03/2017. FINDINGS: CT HEAD FINDINGS Brain: Generalized age-related cerebral atrophy. Confluent hypodensity within the periventricular and deep white matter both cerebral hemispheres, consistent with advanced chronic small vessel ischemic change. Remote lacunar infarct present within the bilateral basal ganglia. No acute  intracranial hemorrhage. No acute large vessel territory infarct. No mass lesion, midline shift or mass effect. No hydrocephalus. No extra-axial fluid collection. Vascular: No asymmetric hyperdense vessel. Calcified atherosclerosis present at the skull base Skull: No acute scalp soft tissue abnormality. Other: Mastoid air cells are clear. CT MAXILLOFACIAL FINDINGS Osseous: Postoperative changes from previous partial resection of the right maxilla. Interval placement of prosthetic right facial buttress implant. Inferior right nasal bone has been resected as well. Plate screw fixation at the right  orbital floor. Previously identified mass at the right nasal labial fold has been resected with probable free flap placement. Scattered surgical clips within this region. Scattered surgical clips present throughout this region as well as within the right submandibular space. Fat density with right maxillary sinus likely postsurgical. No acute fracture about the face. Mandible intact. Zygomatic arches intact. Right-to-left nasal septal deviation which remains intact. Poor dentition with scattered dental caries and periapical lucencies again noted. Orbits: Postoperative changes at the right orbital floor as above. Globes intact. No retro-orbital hematoma or other pathology. No acute orbital fracture. Sinuses: Scattered mucosal thickening within the sphenoid and maxillary sinuses bilaterally. Soft tissues: No appreciable soft tissue injury about the face. Postsurgical changes at the right nasal labial fold. No definite residual mass. IMPRESSION: CT HEAD: 1. No acute intracranial abnormality. 2. Age-related cerebral atrophy with advanced chronic small vessel ischemic disease. CT MAXILLOFACIAL: 1. No acute maxillofacial injury identified. 2. Postoperative changes from prior right facial surgery for resection of right nasolabial fold mass as above. No definite recurrent mass identified. Electronically Signed   By: Jeannine Boga M.D.   On: 07/04/2017 21:53   US Carotid Bilateral  Result Date: 06/25/2017 CLINICAL DATA:  Syncope, vertigo. Hypertension, coronary artery disease, previous tobacco abuse. EXAM: BILATERAL CAROTID DUPLEX ULTRASOUND TECHNIQUE: Pearline Cables scale imaging, color Doppler and duplex ultrasound was performed of bilateral carotid and vertebral arteries in the neck. COMPARISON:  04/04/2017 TECHNIQUE: Quantification of carotid stenosis is based on velocity parameters that correlate the residual internal carotid diameter with NASCET-based stenosis levels, using the diameter of the distal internal carotid  lumen as the denominator for stenosis measurement. The following velocity measurements were obtained: PEAK SYSTOLIC/END DIASTOLIC RIGHT ICA:                     337/91cm/sec CCA:                     95/18AC/ZYS SYSTOLIC ICA/CCA RATIO:  3.5 ECA:                     309cm/sec LEFT ICA:                     135/40cm/sec CCA:                     063/01SW/FUX SYSTOLIC ICA/CCA RATIO:  1.1 ECA:                     171cm/sec FINDINGS: RIGHT CAROTID ARTERY: Eccentric calcified plaque in the bulb extending into proximal internal external carotid arteries. Elevated peak systolic velocities in the proximal ICA just distal to the plaque. Tortuous carotid system. Elsewhere normal waveforms and color Doppler signal. RIGHT VERTEBRAL ARTERY:  Normal flow direction and waveform. LEFT CAROTID ARTERY: Eccentric plaque in the distal common carotid artery, bulb, proximal internal and external carotid arteries. This results in at least mild proximal ICA stenosis. Normal waveforms and color Doppler  signal. Mild tortuosity. LEFT VERTEBRAL ARTERY: Normal flow direction and waveform. IMPRESSION: 1. Bilateral carotid bifurcation and proximal ICA plaque resulting in 70-99% diameter right ICA stenosis, less than 50% diameter left ICA stenosis. Little change from previous study. 2.  Antegrade bilateral vertebral arterial flow. Electronically Signed   By: Lucrezia Europe M.D.   On: 06/25/2017 12:22   Dg Chest Port 1 View  Result Date: 07/04/2017 CLINICAL DATA:  Acute onset of syncope. EXAM: PORTABLE CHEST 1 VIEW COMPARISON:  Chest radiograph and CTA of the chest performed 04/02/2017 FINDINGS: The lungs are well-aerated and clear. There is no evidence of focal opacification, pleural effusion or pneumothorax. The cardiomediastinal silhouette is within normal limits. No acute osseous abnormalities are seen. IMPRESSION: No acute cardiopulmonary process seen. Electronically Signed   By: Garald Balding M.D.   On: 07/04/2017 22:16   Ct Maxillofacial Wo  Contrast  Result Date: 07/04/2017 CLINICAL DATA:  Initial evaluation for acute trauma, fall. History of prior facial tumor resection with reconstructive surgery. EXAM: CT HEAD WITHOUT CONTRAST CT MAXILLOFACIAL WITHOUT CONTRAST TECHNIQUE: Multidetector CT imaging of the head and maxillofacial structures were performed using the standard protocol without intravenous contrast. Multiplanar CT image reconstructions of the maxillofacial structures were also generated. COMPARISON:  Prior CT from 04/03/2017. FINDINGS: CT HEAD FINDINGS Brain: Generalized age-related cerebral atrophy. Confluent hypodensity within the periventricular and deep white matter both cerebral hemispheres, consistent with advanced chronic small vessel ischemic change. Remote lacunar infarct present within the bilateral basal ganglia. No acute intracranial hemorrhage. No acute large vessel territory infarct. No mass lesion, midline shift or mass effect. No hydrocephalus. No extra-axial fluid collection. Vascular: No asymmetric hyperdense vessel. Calcified atherosclerosis present at the skull base Skull: No acute scalp soft tissue abnormality. Other: Mastoid air cells are clear. CT MAXILLOFACIAL FINDINGS Osseous: Postoperative changes from previous partial resection of the right maxilla. Interval placement of prosthetic right facial buttress implant. Inferior right nasal bone has been resected as well. Plate screw fixation at the right orbital floor. Previously identified mass at the right nasal labial fold has been resected with probable free flap placement. Scattered surgical clips within this region. Scattered surgical clips present throughout this region as well as within the right submandibular space. Fat density with right maxillary sinus likely postsurgical. No acute fracture about the face. Mandible intact. Zygomatic arches intact. Right-to-left nasal septal deviation which remains intact. Poor dentition with scattered dental caries and  periapical lucencies again noted. Orbits: Postoperative changes at the right orbital floor as above. Globes intact. No retro-orbital hematoma or other pathology. No acute orbital fracture. Sinuses: Scattered mucosal thickening within the sphenoid and maxillary sinuses bilaterally. Soft tissues: No appreciable soft tissue injury about the face. Postsurgical changes at the right nasal labial fold. No definite residual mass. IMPRESSION: CT HEAD: 1. No acute intracranial abnormality. 2. Age-related cerebral atrophy with advanced chronic small vessel ischemic disease. CT MAXILLOFACIAL: 1. No acute maxillofacial injury identified. 2. Postoperative changes from prior right facial surgery for resection of right nasolabial fold mass as above. No definite recurrent mass identified. Electronically Signed   By: Jeannine Boga M.D.   On: 07/04/2017 21:53      LAB RESULTS: Basic Metabolic Panel: Recent Labs  Lab 07/05/17 0424 07/06/17 0311 07/07/17 0328  NA 136 139 141  K 3.2* 3.8 2.9*  CL 103 106 109  CO2 24 23 25   GLUCOSE 113* 105* 93  BUN 40* 31* 16  CREATININE 2.55* 1.57* 1.29*  CALCIUM 7.8* 8.2* 8.1*  MG 1.8  --   --    Liver Function Tests: Recent Labs  Lab 07/05/17 0424  AST 72*  ALT 75*  ALKPHOS 64  BILITOT 0.4  PROT 5.7*  ALBUMIN 2.5*   No results for input(s): LIPASE, AMYLASE in the last 168 hours. No results for input(s): AMMONIA in the last 168 hours. CBC: Recent Labs  Lab 07/04/17 1938  07/06/17 1241 07/06/17 2220 07/07/17 0328  WBC 15.4*   < > 10.4  --  10.4  NEUTROABS 12.1*  --   --   --   --   HGB 8.4*   < > 6.0* 8.2* 8.1*  HCT 27.4*   < > 19.6* 26.4* 25.4*  MCV 83.0   < > 83.4  --  83.8  PLT 273   < > 200  --  237   < > = values in this interval not displayed.   Cardiac Enzymes: Recent Labs  Lab 07/04/17 1938  07/05/17 0424 07/05/17 1138  CKTOTAL 177  --   --   --   TROPONINI  --    < > 0.18* 0.13*   < > = values in this interval not displayed.    BNP: Invalid input(s): POCBNP CBG: No results for input(s): GLUCAP in the last 168 hours.    Disposition and Follow-up: Discharge Instructions    Diet - low sodium heart healthy   Complete by:  As directed    Increase activity slowly   Complete by:  As directed        DISPOSITION: [place]    Derek Jack, MD. Schedule an appointment as soon as possible for a visit in 1 week(s).   Specialty:  Hematology Contact information: [place] [address] [phone]        Daneil Dolin, MD. Schedule an appointment as soon as possible for a visit in 2 week(s).   Specialty:  Gastroenterology Contact information: [address] [place] [address] [place], [facility] Follow up.   Specialty:  Home Health Services Why:  [facility] services resumed-- HHRN/PT Contact information: [place] [address] [phone]            Time coordinating discharge:  24mins  Signed:   Estill Cotta M.D. [facility] 07/07/2017, 1:14 PM Pager: [phone]

## 2017-07-08 ENCOUNTER — Other Ambulatory Visit: Payer: Self-pay

## 2017-07-08 ENCOUNTER — Emergency Department (HOSPITAL_COMMUNITY)
Admission: EM | Admit: 2017-07-08 | Discharge: 2017-07-08 | Disposition: A | Discharge status: Home / Self Care | Payer: Medicaid Other | Attending: Emergency Medicine | Admitting: Emergency Medicine

## 2017-07-08 ENCOUNTER — Encounter (HOSPITAL_COMMUNITY): Payer: Self-pay

## 2017-07-08 DIAGNOSIS — N182 Chronic kidney disease, stage 2 (mild): Secondary | ICD-10-CM | POA: Diagnosis not present

## 2017-07-08 DIAGNOSIS — R339 Retention of urine, unspecified: Secondary | ICD-10-CM | POA: Diagnosis present

## 2017-07-08 DIAGNOSIS — I5030 Unspecified diastolic (congestive) heart failure: Secondary | ICD-10-CM | POA: Diagnosis not present

## 2017-07-08 DIAGNOSIS — N390 Urinary tract infection, site not specified: Secondary | ICD-10-CM | POA: Insufficient documentation

## 2017-07-08 DIAGNOSIS — F17210 Nicotine dependence, cigarettes, uncomplicated: Secondary | ICD-10-CM | POA: Diagnosis not present

## 2017-07-08 DIAGNOSIS — R319 Hematuria, unspecified: Secondary | ICD-10-CM | POA: Insufficient documentation

## 2017-07-08 DIAGNOSIS — Z79899 Other long term (current) drug therapy: Secondary | ICD-10-CM | POA: Diagnosis not present

## 2017-07-08 DIAGNOSIS — I130 Hypertensive heart and chronic kidney disease with heart failure and stage 1 through stage 4 chronic kidney disease, or unspecified chronic kidney disease: Secondary | ICD-10-CM | POA: Insufficient documentation

## 2017-07-08 LAB — URINALYSIS, ROUTINE W REFLEX MICROSCOPIC
BACTERIA UA: NONE SEEN
Bilirubin Urine: NEGATIVE
Glucose, UA: 50 mg/dL — AB
KETONES UR: NEGATIVE mg/dL
Nitrite: NEGATIVE
PROTEIN: 30 mg/dL — AB
Specific Gravity, Urine: 1.01 (ref 1.005–1.030)
WBC, UA: >50 WBC/hpf — ABNORMAL HIGH (ref 0–5)
pH: 7 (ref 5.0–8.0)

## 2017-07-08 NOTE — ED Triage Notes (Signed)
Pt reports lower abd pain and unable to void since 2 am.  Pt has indwelling foley.  Cath was changed 5 days ago.

## 2017-07-08 NOTE — Discharge Instructions (Addendum)
See the Urologist as scheduled  

## 2017-07-08 NOTE — ED Notes (Signed)
Foley irrigated with sterile water x 2. No return of fluid. Foley removed after deflation of balloon with no difficulties. Bright red blood present upon removal of foley.

## 2017-07-08 NOTE — ED Notes (Addendum)
Foley leg bag applied.  

## 2017-07-08 NOTE — ED Provider Notes (Signed)
[facility] EMERGENCY DEPARTMENT Provider Note   CSN: [id number] Arrival date & time: 07/08/17  2202     History   Chief Complaint Chief Complaint  Patient presents with  . Urinary Retention    HPI Michael [name] is a 62 y.o. male.  The history is provided by the patient. No language interpreter was used.  Urinary Frequency  This is a new problem. The current episode started 12 to 24 hours ago. The problem occurs constantly. The problem has been gradually worsening. Pertinent negatives include no chest pain and no abdominal pain. Nothing aggravates the symptoms. Nothing relieves the symptoms. He has tried nothing for the symptoms. The treatment provided no relief.  Pt reports he is unable to urinate.  Pt reports he has had similar in the past and had to have a foley.  Pt has an antibiotic rx he is suppose to start today for uti   Past Medical History:  Diagnosis Date  . Acute urinary retention    followed by OP urology   . Carotid stenosis   . CHF (congestive heart failure) (HCC)    LVEF 50-55% 03/2017  . CKD (chronic kidney disease)    Stage II  . Head and neck cancer ([place]) 2019   s/p right facial basal cell carcinoma s/p resection with right partial mastectomy and partal rhinectomy with skin graft (06/13/17)  . HTN (hypertension)   . Iron deficiency anemia   . Tobacco abuse     Patient Active Problem List   Diagnosis Date Noted  . Syncope and collapse 07/05/2017  . COPD exacerbation ([place]) 07/05/2017  . Acute renal failure superimposed on chronic kidney disease ([place]) 07/05/2017  . Catheter-associated urinary tract infection ([place]) 07/05/2017  . Leukocytosis 07/05/2017  . Urinary retention with incomplete bladder emptying 07/05/2017  . UTI (urinary tract infection) 07/05/2017  . Diarrhea 07/05/2017  . Carotid artery disease ([place]) 06/25/2017  . AKI (acute kidney injury) ([place])   . Orthostatic syncope   . Syncope due to orthostatic hypotension 06/24/2017  . Heart  murmur 06/24/2017  . Abnormal EKG 06/24/2017  . Abnormal PET scan of colon 05/25/2017  . 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 Periapical abscess 04/04/2017  . Iron (Fe) deficiency anemia 04/04/2017  . Acute diastolic CHF (congestive heart failure) ([place]) 04/04/2017  . Essential hypertension 04/02/2017  . Anemia 04/02/2017  . Acute CHF (congestive heart failure) ([place]) 04/02/2017  . Acute hypoxemic respiratory failure ([place]) 04/02/2017  . Lactic acidosis 04/02/2017  . Tobacco abuse 04/02/2017  . CHF (congestive heart failure) ([place]) 04/02/2017  . Elevated troponin 04/02/2017  . Thrombocytosis ([place]) 04/02/2017  . Hypokalemia 04/02/2017  . Skin lesion of face   . Nasal lesion     Past Surgical History:  Procedure Laterality Date  . ANKLE CLOSED REDUCTION Right   . COLONOSCOPY N/A 05/31/2017   Procedure: COLONOSCOPY;  Surgeon: Daneil Dolin, MD;  [facility];  Service: Endoscopy;  Laterality: N/A;  2:45pm  . POLYPECTOMY  05/31/2017   Procedure: POLYPECTOMY;  Surgeon: Daneil Dolin, MD;  Location: [facility] ENDO SUITE;  Service: Endoscopy;;  . TONSILLECTOMY          Home Medications    Prior to Admission medications   Medication Sig Start Date End Date Taking? Authorizing Provider  amLODipine (NORVASC) 5 MG tablet Take 1 tablet (5 mg total) by mouth daily. 07/07/17   Rai, Ripudeep Raliegh Ip, MD  atorvastatin (LIPITOR) 40 MG tablet Take 1 tablet (40  mg total) by mouth daily at 6 PM. 05/23/17   Derek Jack, MD  carvedilol (COREG) 12.5 MG tablet Take 1 tablet (12.5 mg total) by mouth 2 (two) times daily with a meal. 05/23/17   Derek Jack, MD  cephALEXin (KEFLEX) 500 MG capsule Take 1 capsule (500 mg total) by mouth 3 (three) times daily for 4 days. 07/07/17 07/11/17  Rai, Vernelle Emerald, MD  ferrous sulfate 325 (65 FE) MG tablet Take 1 tablet (325 mg total) by mouth 2 (two) times daily with a meal. 07/07/17   Rai, Ripudeep K, MD  finasteride (PROSCAR) 5 MG  tablet Take 5 mg by mouth daily.    [provider]  loperamide (IMODIUM) 2 MG capsule Take 1 capsule (2 mg total) by mouth 3 (three) times daily as needed for diarrhea or loose stools. 07/07/17   Rai, Ripudeep K, MD  ondansetron (ZOFRAN ODT) 4 MG disintegrating tablet Take 1 tablet (4 mg total) by mouth every 8 (eight) hours as needed for nausea or vomiting. 07/07/17   Mendel Corning, MD    Family History Family History  Problem Relation Age of Onset  . Stroke Father   . Cirrhosis Mother   . Colon cancer Neg Hx     Social History Social History   Tobacco Use  . Smoking status: Current Some Day Smoker    Packs/day: 0.10    Years: 40.00    Pack years: 4.00    Types: Cigarettes  . Smokeless tobacco: Never Used  Substance Use Topics  . Alcohol use: Not Currently    Frequency: Never  . Drug use: Never     Allergies   Patient has no known allergies.   Review of Systems Review of Systems  Cardiovascular: Negative for chest pain.  Gastrointestinal: Negative for abdominal pain.  Genitourinary: Positive for frequency.  All other systems reviewed and are negative.    Physical Exam Updated Vital Signs BP (!) 150/88 (BP Location: Left Arm)   Pulse 79   Temp 98.1 F (36.7 C)   Resp 18   Ht 6' (1.829 m)   Wt 79.8 kg (176 lb)   SpO2 98%   BMI 23.87 kg/m   Physical Exam  Constitutional: He appears well-developed and well-nourished.  HENT:  Head: Normocephalic.  Eyes: Pupils are equal, round, and reactive to light.  Neck: Normal range of motion.  Cardiovascular: Normal rate.  Pulmonary/Chest: Effort normal.  Abdominal: Soft.  Musculoskeletal: Normal range of motion.  Neurological: He is alert.  Skin: Skin is warm.  Psychiatric: He has a normal mood and affect.  Nursing note and vitals reviewed.    ED Treatments / Results  Labs (all labs ordered are listed, but only abnormal results are displayed) Labs Reviewed  URINALYSIS, ROUTINE W REFLEX MICROSCOPIC  - Abnormal; Notable for the following components:      Result Value   APPearance HAZY (*)    Glucose, UA 50 (*)    Hgb urine dipstick LARGE (*)    Protein, ur 30 (*)    Leukocytes, UA SMALL (*)    RBC / HPF >50 (*)    WBC, UA >50 (*)    All other components within normal limits    EKG None  Radiology No results found.  Procedures Procedures (including critical care time)  Medications Ordered in ED Medications - No data to display   Initial Impression / Assessment and Plan / ED Course  I have reviewed the triage vital signs and the  nursing notes.  Pertinent labs & imaging results that were available during my care of the patient were reviewed by me and considered in my medical decision making (see chart for details).     MDM Pt had a foley placed,  Pt had 1200 cc of output.  Ua shows infection.  Pt is scheduled to see the urologist for recheck.    Final Clinical Impressions(s) / ED Diagnoses   Final diagnoses:  Urinary retention  Urinary tract infection with hematuria, site unspecified    ED Discharge Orders    None    Pt advised to follow up as schedued, take antibiotic as directed   Fransico Meadow, PA-C 07/08/17 [place], MD 07/09/17 [phone]

## 2017-07-10 ENCOUNTER — Other Ambulatory Visit: Payer: Self-pay

## 2017-07-10 ENCOUNTER — Emergency Department (HOSPITAL_COMMUNITY)
Admission: EM | Admit: 2017-07-10 | Discharge: 2017-07-10 | Disposition: A | Discharge status: Home / Self Care | Payer: Medicaid Other | Attending: Emergency Medicine | Admitting: Emergency Medicine

## 2017-07-10 ENCOUNTER — Encounter (HOSPITAL_COMMUNITY): Payer: Self-pay | Admitting: Emergency Medicine

## 2017-07-10 ENCOUNTER — Telehealth: Payer: Self-pay

## 2017-07-10 DIAGNOSIS — R55 Syncope and collapse: Secondary | ICD-10-CM | POA: Insufficient documentation

## 2017-07-10 DIAGNOSIS — T83511A Infection and inflammatory reaction due to indwelling urethral catheter, initial encounter: Secondary | ICD-10-CM | POA: Insufficient documentation

## 2017-07-10 DIAGNOSIS — N182 Chronic kidney disease, stage 2 (mild): Secondary | ICD-10-CM | POA: Insufficient documentation

## 2017-07-10 DIAGNOSIS — I509 Heart failure, unspecified: Secondary | ICD-10-CM | POA: Insufficient documentation

## 2017-07-10 DIAGNOSIS — F17210 Nicotine dependence, cigarettes, uncomplicated: Secondary | ICD-10-CM | POA: Insufficient documentation

## 2017-07-10 DIAGNOSIS — I130 Hypertensive heart and chronic kidney disease with heart failure and stage 1 through stage 4 chronic kidney disease, or unspecified chronic kidney disease: Secondary | ICD-10-CM | POA: Insufficient documentation

## 2017-07-10 DIAGNOSIS — Y658 Other specified misadventures during surgical and medical care: Secondary | ICD-10-CM | POA: Insufficient documentation

## 2017-07-10 DIAGNOSIS — Z85828 Personal history of other malignant neoplasm of skin: Secondary | ICD-10-CM | POA: Diagnosis not present

## 2017-07-10 DIAGNOSIS — Z79899 Other long term (current) drug therapy: Secondary | ICD-10-CM | POA: Diagnosis not present

## 2017-07-10 DIAGNOSIS — N390 Urinary tract infection, site not specified: Secondary | ICD-10-CM

## 2017-07-10 LAB — CBC WITH DIFFERENTIAL/PLATELET
BASOS ABS: 0.1 10*3/uL (ref 0.0–0.1)
BASOS PCT: 1 %
EOS PCT: 4 %
Eosinophils Absolute: 0.5 10*3/uL (ref 0.0–0.7)
HCT: 33.1 % — ABNORMAL LOW (ref 39.0–52.0)
Hemoglobin: 10.3 g/dL — ABNORMAL LOW (ref 13.0–17.0)
LYMPHS PCT: 18 %
Lymphs Abs: 2.4 10*3/uL (ref 0.7–4.0)
MCH: 27.2 pg (ref 26.0–34.0)
MCHC: 31.1 g/dL (ref 30.0–36.0)
MCV: 87.3 fL (ref 78.0–100.0)
Monocytes Absolute: 1 10*3/uL (ref 0.1–1.0)
Monocytes Relative: 8 %
Neutro Abs: 9 10*3/uL — ABNORMAL HIGH (ref 1.7–7.7)
Neutrophils Relative %: 69 %
PLATELETS: 528 10*3/uL — AB (ref 150–400)
RBC: 3.79 MIL/uL — ABNORMAL LOW (ref 4.22–5.81)
RDW: 18.8 % — AB (ref 11.5–15.5)
WBC: 12.9 10*3/uL — ABNORMAL HIGH (ref 4.0–10.5)

## 2017-07-10 LAB — BASIC METABOLIC PANEL
ANION GAP: 8 (ref 5–15)
BUN: 15 mg/dL (ref 6–20)
CALCIUM: 8.9 mg/dL (ref 8.9–10.3)
CO2: 26 mmol/L (ref 22–32)
Chloride: 110 mmol/L (ref 101–111)
Creatinine, Ser: 1.16 mg/dL (ref 0.61–1.24)
Glucose, Bld: 101 mg/dL — ABNORMAL HIGH (ref 65–99)
POTASSIUM: 3.4 mmol/L — AB (ref 3.5–5.1)
SODIUM: 144 mmol/L (ref 135–145)

## 2017-07-10 MED ORDER — SODIUM CHLORIDE 0.9 % IV BOLUS
500.0000 mL | Freq: Once | INTRAVENOUS | Status: AC
  Administered 2017-07-10: 500 mL via INTRAVENOUS

## 2017-07-10 MED ORDER — SODIUM CHLORIDE 0.9 % IV SOLN
1.0000 g | Freq: Once | INTRAVENOUS | Status: AC
  Administered 2017-07-10: 1 g via INTRAVENOUS
  Filled 2017-07-10: qty 10

## 2017-07-10 NOTE — ED Triage Notes (Addendum)
Entered in error

## 2017-07-10 NOTE — ED Triage Notes (Signed)
Pt reports he passed out while straining to urinate.  Denies injury from fall.  States he still has a catheter in place, but felt like he needed to urinate.  Has not taken abx for known uti due to no money to get them.

## 2017-07-10 NOTE — ED Provider Notes (Signed)
[facility] EMERGENCY DEPARTMENT Provider Note   CSN: [id number] Arrival date & time: 07/10/17  3875     History   Chief Complaint Chief Complaint  Patient presents with  . Loss of Consciousness    HPI Michael [name] is a 62 y.o. male.  Patient with history of congestive heart failure, chronic kidney disease, basal cell cancer with surgery approximately 4 weeks ago in the face, urinary retention, current Foley catheter, anemia presents with syncope.  Patient was seen for similar few weeks back and admitted to the hospital.  Patient was standing urinating and his Foley felt lightheaded/passed out onto the floor.  No significant injury.   Room 16 is going straight on the left  Past Medical History:  Diagnosis Date  . Acute urinary retention    followed by OP urology   . Carotid stenosis   . CHF (congestive heart failure) (HCC)    LVEF 50-55% 03/2017  . CKD (chronic kidney disease)    Stage II  . Head and neck cancer ([place]) 2019   s/p right facial basal cell carcinoma s/p resection with right partial mastectomy and partal rhinectomy with skin graft (06/13/17)  . HTN (hypertension)   . Iron deficiency anemia   . Tobacco abuse     Patient Active Problem List   Diagnosis Date Noted  . Syncope and collapse 07/05/2017  . COPD exacerbation ([place]) 07/05/2017  . Acute renal failure superimposed on chronic kidney disease ([place]) 07/05/2017  . Catheter-associated urinary tract infection ([place]) 07/05/2017  . Leukocytosis 07/05/2017  . Urinary retention with incomplete bladder emptying 07/05/2017  . UTI (urinary tract infection) 07/05/2017  . Diarrhea 07/05/2017  . Carotid artery disease ([place]) 06/25/2017  . AKI (acute kidney injury) ([place])   . Orthostatic syncope   . Syncope due to orthostatic hypotension 06/24/2017  . Heart murmur 06/24/2017  . Abnormal EKG 06/24/2017  . Abnormal PET scan of colon 05/25/2017  . 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  Periapical abscess 04/04/2017  . Iron (Fe) deficiency anemia 04/04/2017  . Acute diastolic CHF (congestive heart failure) ([place]) 04/04/2017  . Essential hypertension 04/02/2017  . Anemia 04/02/2017  . Acute CHF (congestive heart failure) ([place]) 04/02/2017  . Acute hypoxemic respiratory failure ([place]) 04/02/2017  . Lactic acidosis 04/02/2017  . Tobacco abuse 04/02/2017  . CHF (congestive heart failure) ([place]) 04/02/2017  . Elevated troponin 04/02/2017  . Thrombocytosis ([place]) 04/02/2017  . Hypokalemia 04/02/2017  . Skin lesion of face   . Nasal lesion     Past Surgical History:  Procedure Laterality Date  . ANKLE CLOSED REDUCTION Right   . COLONOSCOPY N/A 05/31/2017   Procedure: COLONOSCOPY;  Surgeon: Daneil Dolin, MD;  [facility];  Service: Endoscopy;  Laterality: N/A;  2:45pm  . POLYPECTOMY  05/31/2017   Procedure: POLYPECTOMY;  Surgeon: Daneil Dolin, MD;  Location[facility];  Service: Endoscopy;;  . TONSILLECTOMY          Home Medications    Prior to Admission medications   Medication Sig Start Date End Date Taking? Authorizing Provider  amLODipine (NORVASC) 5 MG tablet Take 1 tablet (5 mg total) by mouth daily. 07/07/17   Rai, Vernelle Emerald, MD  atorvastatin (LIPITOR) 40 MG tablet Take 1 tablet (40 mg total) by mouth daily at 6 PM. 05/23/17   Derek Jack, MD  carvedilol (COREG) 12.5 MG tablet Take 1 tablet (12.5 mg total) by mouth 2 (two) times daily with a meal.  05/23/17   Derek Jack, MD  cephALEXin (KEFLEX) 500 MG capsule Take 1 capsule (500 mg total) by mouth 3 (three) times daily for 4 days. 07/07/17 07/11/17  Rai, Vernelle Emerald, MD  ferrous sulfate 325 (65 FE) MG tablet Take 1 tablet (325 mg total) by mouth 2 (two) times daily with a meal. 07/07/17   Rai, Ripudeep K, MD  finasteride (PROSCAR) 5 MG tablet Take 5 mg by mouth daily.    [provider]  loperamide (IMODIUM) 2 MG capsule Take 1 capsule (2 mg total) by mouth 3 (three) times daily  as needed for diarrhea or loose stools. 07/07/17   Rai, Ripudeep K, MD  ondansetron (ZOFRAN ODT) 4 MG disintegrating tablet Take 1 tablet (4 mg total) by mouth every 8 (eight) hours as needed for nausea or vomiting. 07/07/17   Mendel Corning, MD    Family History Family History  Problem Relation Age of Onset  . Stroke Father   . Cirrhosis Mother   . Colon cancer Neg Hx     Social History Social History   Tobacco Use  . Smoking status: Current Some Day Smoker    Packs/day: 0.10    Years: 40.00    Pack years: 4.00    Types: Cigarettes  . Smokeless tobacco: Never Used  Substance Use Topics  . Alcohol use: Not Currently    Frequency: Never  . Drug use: Never     Allergies   Patient has no known allergies.   Review of Systems Review of Systems   Physical Exam Updated Vital Signs BP (!) 118/97   Pulse 83   Temp 97.7 F (36.5 C) (Oral)   Resp 17   Ht 6' (1.829 m)   Wt 79.8 kg (176 lb)   SpO2 100%   BMI 23.87 kg/m   Physical Exam  Constitutional: He is oriented to person, place, and time. He appears well-developed and well-nourished.  HENT:  Head: Normocephalic and atraumatic.  Dry mm  Eyes: Right eye exhibits no discharge. Left eye exhibits no discharge.  Neck: Normal range of motion. No tracheal deviation present.  Cardiovascular: Normal rate and regular rhythm.  Pulmonary/Chest: Effort normal and breath sounds normal.  Abdominal: Soft. He exhibits no distension. There is no tenderness. There is no guarding.  Genitourinary:  Genitourinary Comments: Foley catheter in place and urine flowing into the bag.  No gross blood or purulence.  No swelling to the penis or groin.  Musculoskeletal: He exhibits no edema.  Neurological: He is alert and oriented to person, place, and time. No cranial nerve deficit.  Skin: Skin is warm.  Psychiatric: He has a normal mood and affect.  Nursing note and vitals reviewed.    ED Treatments / Results  Labs (all labs ordered are  listed, but only abnormal results are displayed) Labs Reviewed  CBC WITH DIFFERENTIAL/PLATELET  BASIC METABOLIC PANEL    EKG EKG Interpretation  Date/Time:  Monday July 10 2017 07:16:56 EDT Ventricular Rate:  72 PR Interval:    QRS Duration: 113 QT Interval:  464 QTC Calculation: 508 R Axis:   56 Text Interpretation:  Sinus rhythm Prolonged PR interval Anteroseptal infarct, old Prolonged QT interval ST depressions improved  Confirmed by Ezequiel Essex [phone]) on 07/10/2017 7:21:41 AM   Radiology No results found.  Procedures Procedures (including critical care time)  Medications Ordered in ED Medications  sodium chloride 0.9 % bolus 500 mL (500 mLs Intravenous New Bag/Given 07/10/17 0754)  cefTRIAXone (ROCEPHIN) 1 g  in sodium chloride 0.9 % 100 mL IVPB (1 g Intravenous New Bag/Given 07/10/17 0754)     Initial Impression / Assessment and Plan / ED Course  I have reviewed the triage vital signs and the nursing notes.  Pertinent labs & imaging results that were available during my care of the patient were reviewed by me and considered in my medical decision making (see chart for details).    Patient presents with recurrent syncope.  Reviewed medical records and recent admission with echo showing ejection fraction 55% and no significant aortic stenosis.  Patient's episodes are associated with Foley catheter likely vasovagal however with age/medical history plan for screening blood work to look for signs of anemia/potassium abnormality/kidney function.  IV fluid bolus/orthostatics.  EKG pending.  Reviewed last urinalysis and Rocephin ordered.  Patient has prescription for Keflex and said he will fill it today.    Final Clinical Impressions(s) / ED Diagnoses   Final diagnoses:  Urinary tract infection associated with indwelling urethral catheter, initial encounter [facility])  Syncope and collapse    ED Discharge Orders    None       Elnora Morrison, MD 07/11/17 1652

## 2017-07-10 NOTE — Telephone Encounter (Signed)
Kelli at Dr. Constance Haw' office called. Pt was no show for his appt 07/04/17. Vida Roller states he was aware of appt, Amy at the cancer center gave him appt info. He has been seen in ER several times including today. Vida Roller states he has said he has no money. Kelli closed general surgery referral.

## 2017-07-10 NOTE — ED Notes (Signed)
Rx for cephalexin 500 mg 1 capsule three times a day. Quantity 12 called in to [facility] per Dr. Reather Converse.

## 2017-07-10 NOTE — Discharge Instructions (Addendum)
Stay well hydrated. Follow up with urology and primary doctor. Take your oral antibiotics.  If you were given medicines take as directed.  If you are on coumadin or contraceptives realize their levels and effectiveness is altered by many different medicines.  If you have any reaction (rash, tongues swelling, other) to the medicines stop taking and see a physician.    If your blood pressure was elevated in the ER make sure you follow up for management with a primary doctor or return for chest pain, shortness of breath or stroke symptoms.  Please follow up as directed and return to the ER or see a physician for new or worsening symptoms.  Thank you. Vitals:   07/10/17 0718 07/10/17 0730 07/10/17 0745 07/10/17 0900  BP:  (!) 118/97  (!) 170/82  Pulse:  74 83 76  Resp:  20 17   Temp:      TempSrc:      SpO2:  100% 100% 99%  Weight: 79.8 kg (176 lb)     Height: 6' (1.829 m)

## 2017-07-11 NOTE — Telephone Encounter (Signed)
I tried to call the patient to see if he wanted to reschedule his appt, however I was not able to reach him.  Jackelyn Poling, will you all mail the patient stating a "no show letter"?

## 2017-07-16 NOTE — Telephone Encounter (Signed)
He should have a follow-up appt with [place]

## 2017-07-17 ENCOUNTER — Encounter: Payer: Self-pay | Admitting: Internal Medicine

## 2017-07-17 NOTE — Telephone Encounter (Signed)
PATIENT SCHEDULED AND LETTER SENT  °

## 2017-07-17 NOTE — Telephone Encounter (Signed)
Stacey please schedule the patient to see [place] in the first urgent appt slot

## 2017-07-19 ENCOUNTER — Telehealth (HOSPITAL_COMMUNITY): Payer: Self-pay | Admitting: *Deleted

## 2017-07-21 NOTE — Telephone Encounter (Signed)
Per his home health nurse, they were only contracted for 3 visits, his graft on his left arm has not healed completely and the nurse doesn't want him not to have any care. She is requesting Dr. Delton Coombes refer him to the wound clinic until his arm is healed. Patients next follow up at [facility] is in September. Explained to [facility] that Dr. Delton Coombes would not be back until early next week and I would ask him then. She stated that was fine.

## 2017-07-29 ENCOUNTER — Encounter (HOSPITAL_COMMUNITY): Payer: Self-pay | Admitting: Emergency Medicine

## 2017-07-29 ENCOUNTER — Emergency Department (HOSPITAL_COMMUNITY)
Admission: EM | Admit: 2017-07-29 | Discharge: 2017-07-29 | Disposition: A | Discharge status: Home / Self Care | Payer: Medicaid Other | Attending: Emergency Medicine | Admitting: Emergency Medicine

## 2017-07-29 ENCOUNTER — Other Ambulatory Visit: Payer: Self-pay

## 2017-07-29 DIAGNOSIS — Z91148 Patient's other noncompliance with medication regimen for other reason: Secondary | ICD-10-CM

## 2017-07-29 DIAGNOSIS — N182 Chronic kidney disease, stage 2 (mild): Secondary | ICD-10-CM | POA: Insufficient documentation

## 2017-07-29 DIAGNOSIS — T839XXA Unspecified complication of genitourinary prosthetic device, implant and graft, initial encounter: Secondary | ICD-10-CM

## 2017-07-29 DIAGNOSIS — Y829 Unspecified medical devices associated with adverse incidents: Secondary | ICD-10-CM | POA: Diagnosis not present

## 2017-07-29 DIAGNOSIS — T83038A Leakage of other indwelling urethral catheter, initial encounter: Secondary | ICD-10-CM | POA: Insufficient documentation

## 2017-07-29 DIAGNOSIS — Z7689 Persons encountering health services in other specified circumstances: Secondary | ICD-10-CM

## 2017-07-29 DIAGNOSIS — Z9114 Patient's other noncompliance with medication regimen: Secondary | ICD-10-CM

## 2017-07-29 DIAGNOSIS — J449 Chronic obstructive pulmonary disease, unspecified: Secondary | ICD-10-CM | POA: Insufficient documentation

## 2017-07-29 DIAGNOSIS — I130 Hypertensive heart and chronic kidney disease with heart failure and stage 1 through stage 4 chronic kidney disease, or unspecified chronic kidney disease: Secondary | ICD-10-CM | POA: Insufficient documentation

## 2017-07-29 DIAGNOSIS — Z79899 Other long term (current) drug therapy: Secondary | ICD-10-CM | POA: Insufficient documentation

## 2017-07-29 DIAGNOSIS — I5032 Chronic diastolic (congestive) heart failure: Secondary | ICD-10-CM | POA: Insufficient documentation

## 2017-07-29 DIAGNOSIS — F17210 Nicotine dependence, cigarettes, uncomplicated: Secondary | ICD-10-CM | POA: Diagnosis not present

## 2017-07-29 LAB — URINALYSIS, ROUTINE W REFLEX MICROSCOPIC
Bilirubin Urine: NEGATIVE
Glucose, UA: NEGATIVE mg/dL
Ketones, ur: NEGATIVE mg/dL
Nitrite: NEGATIVE
Protein, ur: 30 mg/dL — AB
SPECIFIC GRAVITY, URINE: 1.005 (ref 1.005–1.030)
WBC, UA: >50 WBC/hpf — ABNORMAL HIGH (ref 0–5)
pH: 7 (ref 5.0–8.0)

## 2017-07-29 MED ORDER — AMLODIPINE BESYLATE 5 MG PO TABS
5.0000 mg | ORAL_TABLET | Freq: Once | ORAL | Status: AC
  Administered 2017-07-29: 5 mg via ORAL
  Filled 2017-07-29: qty 1

## 2017-07-29 MED ORDER — CARVEDILOL 12.5 MG PO TABS
12.5000 mg | ORAL_TABLET | Freq: Once | ORAL | Status: AC
  Administered 2017-07-29: 12.5 mg via ORAL
  Filled 2017-07-29: qty 1

## 2017-07-29 NOTE — ED Triage Notes (Signed)
Pt reports he his foley is leaking at meatus that started last night. Denies pain until he tries to void.

## 2017-07-29 NOTE — ED Notes (Signed)
Pt reports he has not been on BP medication or any medication in 3-4 weeks.

## 2017-07-29 NOTE — ED Provider Notes (Signed)
[facility] EMERGENCY DEPARTMENT Provider Note   CSN: [id number] Arrival date & time: 07/29/17  1147     History   Chief Complaint Chief Complaint  Patient presents with  . foley leaking    HPI Michael [name] is a 62 y.o. male.  HPI  Pt was seen at 1215. Per pt, c/o gradual onset and persistence of constant "foley catheter leaking" since yesterday. Pt states the foley was placed 3 weeks ago and "he wasn't told what to do after that."  States he was never told to f/u with a Urologist. Pt also states he has not been taking any of his medications in the past 3 to 4 weeks because "someone told me to stop taking all of them." Pt states he has the rx/pills at home, just has not been taking them. Pt cannot tell me who told him to stop taking his meds. The symptoms have been associated with no other complaints. Denies fevers, no testicular pain/swelling, no dysuria/hematuria, no back pain, no abd pain, no N/V/D, no CP/SOB. The patient has a significant history of similar symptoms previously, recently being evaluated for this complaint and multiple prior evals for same. Pt has been evaluated in the ED 6 times in the last 4 weeks and admitted x2.     Past Medical History:  Diagnosis Date  . Acute urinary retention    followed by OP urology   . Carotid stenosis   . CHF (congestive heart failure) (HCC)    LVEF 50-55% 03/2017  . CKD (chronic kidney disease)    Stage II  . Head and neck cancer ([place]) 2019   s/p right facial basal cell carcinoma s/p resection with right partial mastectomy and partal rhinectomy with skin graft (06/13/17)  . HTN (hypertension)   . Iron deficiency anemia   . Tobacco abuse     Patient Active Problem List   Diagnosis Date Noted  . Syncope and collapse 07/05/2017  . COPD exacerbation ([place]) 07/05/2017  . Acute renal failure superimposed on chronic kidney disease ([place]) 07/05/2017  . Catheter-associated urinary tract infection ([place]) 07/05/2017  . Leukocytosis  07/05/2017  . Urinary retention with incomplete bladder emptying 07/05/2017  . UTI (urinary tract infection) 07/05/2017  . Diarrhea 07/05/2017  . Carotid artery disease ([place]) 06/25/2017  . AKI (acute kidney injury) ([place])   . Orthostatic syncope   . Syncope due to orthostatic hypotension 06/24/2017  . Heart murmur 06/24/2017  . Abnormal EKG 06/24/2017  . Abnormal PET scan of colon 05/25/2017  . 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 Periapical abscess 04/04/2017  . Iron (Fe) deficiency anemia 04/04/2017  . Acute diastolic CHF (congestive heart failure) ([place]) 04/04/2017  . Essential hypertension 04/02/2017  . Anemia 04/02/2017  . Acute CHF (congestive heart failure) ([place]) 04/02/2017  . Acute hypoxemic respiratory failure ([place]) 04/02/2017  . Lactic acidosis 04/02/2017  . Tobacco abuse 04/02/2017  . CHF (congestive heart failure) ([place]) 04/02/2017  . Elevated troponin 04/02/2017  . Thrombocytosis ([place]) 04/02/2017  . Hypokalemia 04/02/2017  . Skin lesion of face   . Nasal lesion     Past Surgical History:  Procedure Laterality Date  . ANKLE CLOSED REDUCTION Right   . COLONOSCOPY N/A 05/31/2017   Procedure: COLONOSCOPY;  Surgeon: Daneil Dolin, MD;  [facility];  Service: Endoscopy;  Laterality: N/A;  2:45pm  . POLYPECTOMY  05/31/2017   Procedure: POLYPECTOMY;  Surgeon: Daneil Dolin, MD;  Location: [facility];  Service: Endoscopy;;  .  TONSILLECTOMY          Home Medications    Prior to Admission medications   Medication Sig Start Date End Date Taking? Authorizing Provider  amLODipine (NORVASC) 5 MG tablet Take 1 tablet (5 mg total) by mouth daily. 07/07/17   Rai, Vernelle Emerald, MD  atorvastatin (LIPITOR) 40 MG tablet Take 1 tablet (40 mg total) by mouth daily at 6 PM. 05/23/17   Derek Jack, MD  carvedilol (COREG) 12.5 MG tablet Take 1 tablet (12.5 mg total) by mouth 2 (two) times daily with a meal. 05/23/17   Derek Jack, MD    ferrous sulfate 325 (65 FE) MG tablet Take 1 tablet (325 mg total) by mouth 2 (two) times daily with a meal. 07/07/17   Rai, Ripudeep K, MD  finasteride (PROSCAR) 5 MG tablet Take 5 mg by mouth daily.    [provider]  loperamide (IMODIUM) 2 MG capsule Take 1 capsule (2 mg total) by mouth 3 (three) times daily as needed for diarrhea or loose stools. 07/07/17   Rai, Ripudeep K, MD  ondansetron (ZOFRAN ODT) 4 MG disintegrating tablet Take 1 tablet (4 mg total) by mouth every 8 (eight) hours as needed for nausea or vomiting. 07/07/17   Mendel Corning, MD    Family History Family History  Problem Relation Age of Onset  . Stroke Father   . Cirrhosis Mother   . Colon cancer Neg Hx     Social History Social History   Tobacco Use  . Smoking status: Current Some Day Smoker    Packs/day: 0.10    Years: 40.00    Pack years: 4.00    Types: Cigarettes  . Smokeless tobacco: Never Used  Substance Use Topics  . Alcohol use: Not Currently    Frequency: Never  . Drug use: Never     Allergies   Patient has no known allergies.   Review of Systems Review of Systems ROS: Statement: All systems negative except as marked or noted in the HPI; Constitutional: Negative for fever and chills. ; ; Eyes: Negative for eye pain, redness and discharge. ; ; ENMT: Negative for ear pain, hoarseness, nasal congestion, sinus pressure and sore throat. ; ; Cardiovascular: Negative for chest pain, palpitations, diaphoresis, dyspnea and peripheral edema. ; ; Respiratory: Negative for cough, wheezing and stridor. ; ; Gastrointestinal: Negative for nausea, vomiting, diarrhea, abdominal pain, blood in stool, hematemesis, jaundice and rectal bleeding. . ; ; Genitourinary: +foley leaking. Negative for dysuria, flank pain and hematuria. ; ; Genital:  No penile drainage or rash, no testicular pain or swelling, no scrotal rash or swelling. ;; Musculoskeletal: Negative for back pain and neck pain. Negative for swelling and  trauma.; ; Skin: Negative for pruritus, rash, abrasions, blisters, bruising and skin lesion.; ; Neuro: Negative for headache, lightheadedness and neck stiffness. Negative for weakness, altered level of consciousness, altered mental status, extremity weakness, paresthesias, involuntary movement, seizure and syncope.       Physical Exam Updated Vital Signs BP (!) 194/102 (BP Location: Right Arm)   Pulse 92   Temp 98.5 F (36.9 C) (Oral)   Resp 14   Ht 6' (1.829 m)   Wt 79.8 kg (176 lb)   SpO2 99%   BMI 23.87 kg/m    BP (!) 182/88   Pulse 90   Temp 98.5 F (36.9 C) (Oral)   Resp 18   Ht 6' (1.829 m)   Wt 79.8 kg (176 lb)   SpO2 98%  BMI 23.87 kg/m    Physical Exam 1220: Physical examination:  Nursing notes reviewed; Vital signs and O2 SAT reviewed;  Constitutional: Well developed, Well nourished, Well hydrated, In no acute distress; Head:  Normocephalic, atraumatic. Right face deformity s/p surgery.; Eyes: EOMI, PERRL, No scleral icterus; ENMT: Mouth and pharynx normal, Mucous membranes moist; Neck: Supple, Full range of motion, No lymphadenopathy; Cardiovascular: Regular rate and rhythm, No gallop; Respiratory: Breath sounds clear & equal bilaterally, No wheezes.  Speaking full sentences with ease, Normal respiratory effort/excursion; Chest: Nontender, Movement normal; Abdomen: Soft, Nontender, Nondistended, Normal bowel sounds; Genitourinary: No CVA tenderness. Urine leaking around foley.; Extremities: Peripheral pulses normal, No tenderness, No edema, No calf edema or asymmetry.; Neuro: AA&Ox3, Major CN grossly intact.  Speech clear. No gross focal motor or sensory deficits in extremities.; Skin: Color normal, Warm, Dry.   ED Treatments / Results  Labs (all labs ordered are listed, but only abnormal results are displayed)   EKG None  Radiology   Procedures Procedures (including critical care time)  Medications Ordered in ED Medications  amLODipine (NORVASC) tablet  5 mg (has no administration in time range)  carvedilol (COREG) tablet 12.5 mg (has no administration in time range)     Initial Impression / Assessment and Plan / ED Course  I have reviewed the triage vital signs and the nursing notes.  Pertinent labs & imaging results that were available during my care of the patient were reviewed by me and considered in my medical decision making (see chart for details).  MDM Reviewed: previous chart, nursing note and vitals Reviewed previous: labs Interpretation: labs   Results for orders placed or performed during the hospital encounter of 07/29/17  Urinalysis, Routine w reflex microscopic  Result Value Ref Range   Color, Urine YELLOW YELLOW   APPearance HAZY (A) CLEAR   Specific Gravity, Urine 1.005 1.005 - 1.030   pH 7.0 5.0 - 8.0   Glucose, UA NEGATIVE NEGATIVE mg/dL   Hgb urine dipstick MODERATE (A) NEGATIVE   Bilirubin Urine NEGATIVE NEGATIVE   Ketones, ur NEGATIVE NEGATIVE mg/dL   Protein, ur 30 (A) NEGATIVE mg/dL   Nitrite NEGATIVE NEGATIVE   Leukocytes, UA LARGE (A) NEGATIVE   RBC / HPF 21-50 0 - 5 RBC/hpf   WBC, UA >50 (H) 0 - 5 WBC/hpf   Bacteria, UA RARE (A) NONE SEEN   Squamous Epithelial / LPF 0-5 0 - 5   WBC Clumps PRESENT    Mucus PRESENT    Non Squamous Epithelial 0-5 (A) NONE SEEN    1225:  Pt states he was "never told what to do" with his foley, and "never" received a Uro MD name to f/u with. States he was "not told" to f/u with a PMD. Also states he was "told to stop taking all of his medications," but cannot tell me who told him this or why. Epic chart extensively reviewed:  Pt has been evaluated in the ED 6 times in the past 4 weeks and admitted x2, pt was referred to Uro MD each evaluation, encouraged to f/u with PMD, and he was not instructed to d/c all of his meds. Pt informed of this. States he will start to take his meds again. Will dose BP meds now while foley being changed.   1450:  Foley changed. Clear/yellow  urine draining freely in tubing, no obvious hematuria, no leaking. Pt states he is ready to go home now. No clear UTI on Udip in pt with long term  indwelling foley and pt denies dysuria or penile pain. No fevers in ED. No abd pain, no fevers, no flank pain on exam. UC ordered and is pending. Pt strongly encouraged to take his meds and f/u with Uro MD and PMD for good continuity of care and control of his chronic medical conditions. Pt verb understanding. Dx and testing d/w pt.  Questions answered.  Verb understanding, agreeable to d/c home with outpt f/u.     Final Clinical Impressions(s) / ED Diagnoses   Final diagnoses:  None    ED Discharge Orders    None       Francine Graven, DO 08/01/17 2157

## 2017-07-29 NOTE — Discharge Instructions (Addendum)
Take your usual prescriptions as previously directed.  Call your regular medical doctor and the Urologist on Monday to schedule a follow up appointment this week.  Return to the Emergency Department immediately sooner if worsening.

## 2017-07-30 ENCOUNTER — Encounter (HOSPITAL_COMMUNITY): Payer: Self-pay | Admitting: Emergency Medicine

## 2017-07-30 ENCOUNTER — Emergency Department (HOSPITAL_COMMUNITY)
Admission: EM | Admit: 2017-07-30 | Discharge: 2017-07-30 | Disposition: A | Discharge status: Home / Self Care | Payer: Medicaid Other | Attending: Emergency Medicine | Admitting: Emergency Medicine

## 2017-07-30 ENCOUNTER — Other Ambulatory Visit: Payer: Self-pay

## 2017-07-30 DIAGNOSIS — I509 Heart failure, unspecified: Secondary | ICD-10-CM | POA: Insufficient documentation

## 2017-07-30 DIAGNOSIS — N182 Chronic kidney disease, stage 2 (mild): Secondary | ICD-10-CM | POA: Insufficient documentation

## 2017-07-30 DIAGNOSIS — R339 Retention of urine, unspecified: Secondary | ICD-10-CM | POA: Diagnosis present

## 2017-07-30 DIAGNOSIS — Z79899 Other long term (current) drug therapy: Secondary | ICD-10-CM | POA: Insufficient documentation

## 2017-07-30 DIAGNOSIS — I130 Hypertensive heart and chronic kidney disease with heart failure and stage 1 through stage 4 chronic kidney disease, or unspecified chronic kidney disease: Secondary | ICD-10-CM | POA: Diagnosis not present

## 2017-07-30 DIAGNOSIS — F17210 Nicotine dependence, cigarettes, uncomplicated: Secondary | ICD-10-CM | POA: Insufficient documentation

## 2017-07-30 NOTE — Discharge Instructions (Addendum)
Follow-up as directed yesterday, with urology, and primary care.

## 2017-07-30 NOTE — ED Provider Notes (Signed)
[facility] EMERGENCY DEPARTMENT Provider Note   CSN: [id number] Arrival date & time: 07/30/17  9983     History   Chief Complaint Chief Complaint  Patient presents with  . Urinary Retention    HPI Michael [name] is a 62 y.o. male.  HPI   Patient is here because his Foley catheter was " not working last night."  He did have some urinary output into the catheter bag this morning.  He was evaluated yesterday, and his treatment plan was clarified by the provider.  A new Foley catheter was placed at that time.  He was instructed to follow-up with PCP and urology for ongoing management of chronic conditions.  Patient denies nausea, vomiting, abdominal or back pain.  There are no other known modifying factors.  Past Medical History:  Diagnosis Date  . Acute urinary retention    followed by OP urology   . Carotid stenosis   . CHF (congestive heart failure) (HCC)    LVEF 50-55% 03/2017  . CKD (chronic kidney disease)    Stage II  . Head and neck cancer ([place]) 2019   s/p right facial basal cell carcinoma s/p resection with right partial mastectomy and partal rhinectomy with skin graft (06/13/17)  . HTN (hypertension)   . Iron deficiency anemia   . Tobacco abuse     Patient Active Problem List   Diagnosis Date Noted  . Syncope and collapse 07/05/2017  . COPD exacerbation ([place]) 07/05/2017  . Acute renal failure superimposed on chronic kidney disease ([place]) 07/05/2017  . Catheter-associated urinary tract infection ([place]) 07/05/2017  . Leukocytosis 07/05/2017  . Urinary retention with incomplete bladder emptying 07/05/2017  . UTI (urinary tract infection) 07/05/2017  . Diarrhea 07/05/2017  . Carotid artery disease ([place]) 06/25/2017  . AKI (acute kidney injury) ([place])   . Orthostatic syncope   . Syncope due to orthostatic hypotension 06/24/2017  . Heart murmur 06/24/2017  . Abnormal EKG 06/24/2017  . Abnormal PET scan of colon 05/25/2017  . Basal cell carcinoma (BCC) of nasolabial  groove 05/15/2017  . Periapical abscess 04/04/2017  . Iron (Fe) deficiency anemia 04/04/2017  . Acute diastolic CHF (congestive heart failure) ([place]) 04/04/2017  . Essential hypertension 04/02/2017  . Anemia 04/02/2017  . Acute CHF (congestive heart failure) ([place]) 04/02/2017  . Acute hypoxemic respiratory failure ([place]) 04/02/2017  . Lactic acidosis 04/02/2017  . Tobacco abuse 04/02/2017  . CHF (congestive heart failure) ([place]) 04/02/2017  . Elevated troponin 04/02/2017  . Thrombocytosis ([place]) 04/02/2017  . Hypokalemia 04/02/2017  . Skin lesion of face   . Nasal lesion     Past Surgical History:  Procedure Laterality Date  . ANKLE CLOSED REDUCTION Right   . COLONOSCOPY N/A 05/31/2017   Procedure: COLONOSCOPY;  Surgeon: Daneil Dolin, MD;  Location[facility];  Service: Endoscopy;  Laterality: N/A;  2:45pm  . POLYPECTOMY  05/31/2017   Procedure: POLYPECTOMY;  Surgeon: Daneil Dolin, MD;  Location: [facility];  Service: Endoscopy;;  . TONSILLECTOMY          Home Medications    Prior to Admission medications   Medication Sig Start Date End Date Taking? Authorizing Provider  amLODipine (NORVASC) 5 MG tablet Take 1 tablet (5 mg total) by mouth daily. 07/07/17   Rai, Vernelle Emerald, MD  atorvastatin (LIPITOR) 40 MG tablet Take 1 tablet (40 mg total) by mouth daily at 6 PM. 05/23/17   Derek Jack, MD  carvedilol (COREG) 12.5 MG tablet Take 1 tablet (  12.5 mg total) by mouth 2 (two) times daily with a meal. 05/23/17   Derek Jack, MD  ferrous sulfate 325 (65 FE) MG tablet Take 1 tablet (325 mg total) by mouth 2 (two) times daily with a meal. 07/07/17   Rai, Ripudeep K, MD  finasteride (PROSCAR) 5 MG tablet Take 5 mg by mouth daily.    [provider]  loperamide (IMODIUM) 2 MG capsule Take 1 capsule (2 mg total) by mouth 3 (three) times daily as needed for diarrhea or loose stools. 07/07/17   Rai, Ripudeep K, MD  ondansetron (ZOFRAN ODT) 4 MG disintegrating tablet  Take 1 tablet (4 mg total) by mouth every 8 (eight) hours as needed for nausea or vomiting. 07/07/17   Mendel Corning, MD    Family History Family History  Problem Relation Age of Onset  . Stroke Father   . Cirrhosis Mother   . Colon cancer Neg Hx     Social History Social History   Tobacco Use  . Smoking status: Current Some Day Smoker    Packs/day: 0.10    Years: 40.00    Pack years: 4.00    Types: Cigarettes  . Smokeless tobacco: Never Used  Substance Use Topics  . Alcohol use: Not Currently    Frequency: Never  . Drug use: Never     Allergies   Patient has no known allergies.   Review of Systems Review of Systems  All other systems reviewed and are negative.    Physical Exam Updated Vital Signs BP (!) 149/71 (BP Location: Right Arm)   Pulse (!) 44   Temp 97.6 F (36.4 C) (Oral)   Resp 16   Ht 6' (1.829 m)   Wt 79.8 kg (176 lb)   SpO2 100%   BMI 23.87 kg/m   Physical Exam  Constitutional: He is oriented to person, place, and time. He appears well-developed and well-nourished.  HENT:  Head: Normocephalic and atraumatic.  Right Ear: External ear normal.  Left Ear: External ear normal.  Eyes: Pupils are equal, round, and reactive to light. Conjunctivae and EOM are normal.  Neck: Normal range of motion and phonation normal. Neck supple.  Cardiovascular: Normal rate.  Pulmonary/Chest: Effort normal. He exhibits no bony tenderness.  Abdominal: Soft. He exhibits no mass. There is no tenderness. There is no guarding.  Genitourinary:  Genitourinary Comments: Penis with Foley in urethra.  Scrotum normal.  Foley catheter draining clear urine into the catheter bag.  Musculoskeletal: Normal range of motion.  Neurological: He is alert and oriented to person, place, and time. No cranial nerve deficit or sensory deficit. He exhibits normal muscle tone. Coordination normal.  Skin: Skin is warm, dry and intact.  Psychiatric: He has a normal mood and affect. His  behavior is normal. Judgment and thought content normal.  Nursing note and vitals reviewed.    ED Treatments / Results  Labs (all labs ordered are listed, but only abnormal results are displayed) Labs Reviewed - No data to display  EKG None  Radiology No results found.  Procedures Procedures (including critical care time)  Medications Ordered in ED Medications - No data to display   Initial Impression / Assessment and Plan / ED Course  I have reviewed the triage vital signs and the nursing notes.  Pertinent labs & imaging results that were available during my care of the patient were reviewed by me and considered in my medical decision making (see chart for details).  Patient Vitals for the past 24 hrs:  BP Temp Temp src Pulse Resp SpO2 Height Weight  07/30/17 0902 (!) 149/71 97.6 F (36.4 C) Oral (!) 44 16 100 % 6' (1.829 m) 79.8 kg (176 lb)    12:57 PM Reevaluation with update and discussion. After initial assessment and treatment, an updated evaluation reveals patient's discomfort has resolved since his Foley catheter was replaced by nursing.  Urine is currently draining fairly clear urine.Daleen Bo   Medical Decision Making: Urinary retention, likely secondary to hematuria.  Nursing successfully placed catheter, and recovered bloody urine with clots.  No indication for urinalysis at this time.  Patient is improved and stable for discharge.  CRITICAL CARE-no Performed by: Daleen Bo   Nursing Notes Reviewed/ Care Coordinated Applicable Imaging Reviewed Interpretation of Laboratory Data incorporated into ED treatment  The patient appears reasonably screened and/or stabilized for discharge and I doubt any other medical condition or other [facility] requiring further screening, evaluation, or treatment in the ED at this time prior to discharge.  Plan: Home Medications-continue usual medications; Home Treatments-Foley catheter care at home; return here if the  recommended treatment, does not improve the symptoms; Recommended follow up-urology follow-up this week as scheduled.     Final Clinical Impressions(s) / ED Diagnoses   Final diagnoses:  None    ED Discharge Orders    None       Daleen Bo, MD 07/30/17 1807

## 2017-07-30 NOTE — ED Notes (Signed)
Foley withdrawn, #18 placed  Return of clots as well as urine

## 2017-07-30 NOTE — ED Notes (Signed)
Pt has >69mL of urine in bladder

## 2017-07-30 NOTE — ED Triage Notes (Signed)
Patient had foley cath placed here in ED. Per patient foley leaking around tube (not draining urine into bag.) Per patient pain due to continued urinary retention.

## 2017-08-02 LAB — URINE CULTURE

## 2017-08-03 ENCOUNTER — Telehealth: Payer: Self-pay | Admitting: *Deleted

## 2017-08-03 NOTE — Telephone Encounter (Signed)
Post ED Visit - Positive Culture Follow-up  Culture report reviewed by antimicrobial stewardship pharmacist:  []  Elenor Quinones, Pharm.D. []  Heide Guile, Pharm.D., BCPS AQ-ID []  Parks Neptune, Pharm.D., BCPS []  Alycia Rossetti, Pharm.D., BCPS []  [place], Pharm.D., BCPS, AAHIVP []  Legrand Como, Pharm.D., BCPS, AAHIVP []  Salome Arnt, PharmD, BCPS []  Johnnette Gourd, PharmD, BCPS []  Hughes Better, PharmD, BCPS []  Leeroy Cha, PharmD Lucio Edward, PharmD  Positive urine culture, reviewed by Arlean Hopping, PA-C No further patient follow-up is required at this time.  Harlon Flor [facility] 08/03/2017, 10:43 AM

## 2017-08-18 ENCOUNTER — Ambulatory Visit: Payer: Self-pay | Admitting: Urology

## 2017-08-30 ENCOUNTER — Emergency Department (HOSPITAL_COMMUNITY)
Admission: EM | Admit: 2017-08-30 | Discharge: 2017-08-30 | Disposition: A | Discharge status: Home / Self Care | Payer: Medicaid Other | Attending: Emergency Medicine | Admitting: Emergency Medicine

## 2017-08-30 ENCOUNTER — Other Ambulatory Visit: Payer: Self-pay

## 2017-08-30 ENCOUNTER — Encounter (HOSPITAL_COMMUNITY): Payer: Self-pay

## 2017-08-30 DIAGNOSIS — I5031 Acute diastolic (congestive) heart failure: Secondary | ICD-10-CM | POA: Insufficient documentation

## 2017-08-30 DIAGNOSIS — I130 Hypertensive heart and chronic kidney disease with heart failure and stage 1 through stage 4 chronic kidney disease, or unspecified chronic kidney disease: Secondary | ICD-10-CM | POA: Diagnosis not present

## 2017-08-30 DIAGNOSIS — Y69 Unspecified misadventure during surgical and medical care: Secondary | ICD-10-CM | POA: Diagnosis not present

## 2017-08-30 DIAGNOSIS — Z79899 Other long term (current) drug therapy: Secondary | ICD-10-CM | POA: Diagnosis not present

## 2017-08-30 DIAGNOSIS — N189 Chronic kidney disease, unspecified: Secondary | ICD-10-CM | POA: Insufficient documentation

## 2017-08-30 DIAGNOSIS — R339 Retention of urine, unspecified: Secondary | ICD-10-CM | POA: Diagnosis present

## 2017-08-30 DIAGNOSIS — F17210 Nicotine dependence, cigarettes, uncomplicated: Secondary | ICD-10-CM | POA: Insufficient documentation

## 2017-08-30 DIAGNOSIS — T83098A Other mechanical complication of other indwelling urethral catheter, initial encounter: Secondary | ICD-10-CM | POA: Insufficient documentation

## 2017-08-30 DIAGNOSIS — T839XXA Unspecified complication of genitourinary prosthetic device, implant and graft, initial encounter: Secondary | ICD-10-CM

## 2017-08-30 NOTE — Discharge Instructions (Signed)
As discussed, your evaluation today has been largely reassuring.  But, it is important that you monitor your condition carefully, and do not hesitate to return to the ED if you develop new, or concerning changes in your condition. ? ?Otherwise, please follow-up with your physician for appropriate ongoing care. ? ?

## 2017-08-30 NOTE — ED Provider Notes (Signed)
[facility] EMERGENCY DEPARTMENT Provider Note   CSN: [id number] Arrival date & time: 08/30/17  1131     History   Chief Complaint No chief complaint on file.   HPI Michael [name] is a 62 y.o. male.  HPI Patient presents with concern of difficulty with his Foley catheter. This catheter was placed about 5 weeks ago, his knee replacement for original device placed 4 weeks prior to that. Initial presentation was for acute urinary retention. Patient has not yet seen a urologist, though he is aware of where the urology office is. He offers no clear precipitant for his retention. Now, over the past day or so he has had urine leaking around the edges of the Foley catheter, without abdominal pain, without fever, without chills, without other complaints.  Past Medical History:  Diagnosis Date  . Acute urinary retention    followed by OP urology   . Carotid stenosis   . CHF (congestive heart failure) (HCC)    LVEF 50-55% 03/2017  . CKD (chronic kidney disease)    Stage II  . Head and neck cancer ([place]) 2019   s/p right facial basal cell carcinoma s/p resection with right partial mastectomy and partal rhinectomy with skin graft (06/13/17)  . HTN (hypertension)   . Iron deficiency anemia   . Tobacco abuse     Patient Active Problem List   Diagnosis Date Noted  . Syncope and collapse 07/05/2017  . COPD exacerbation ([place]) 07/05/2017  . Acute renal failure superimposed on chronic kidney disease ([place]) 07/05/2017  . Catheter-associated urinary tract infection ([place]) 07/05/2017  . Leukocytosis 07/05/2017  . Urinary retention with incomplete bladder emptying 07/05/2017  . UTI (urinary tract infection) 07/05/2017  . Diarrhea 07/05/2017  . Carotid artery disease ([place]) 06/25/2017  . AKI (acute kidney injury) ([place])   . Orthostatic syncope   . Syncope due to orthostatic hypotension 06/24/2017  . Heart murmur 06/24/2017  . Abnormal EKG 06/24/2017  . Abnormal PET scan of colon 05/25/2017    . 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 Periapical abscess 04/04/2017  . Iron (Fe) deficiency anemia 04/04/2017  . Acute diastolic CHF (congestive heart failure) ([place]) 04/04/2017  . Essential hypertension 04/02/2017  . Anemia 04/02/2017  . Acute CHF (congestive heart failure) ([place]) 04/02/2017  . Acute hypoxemic respiratory failure ([place]) 04/02/2017  . Lactic acidosis 04/02/2017  . Tobacco abuse 04/02/2017  . CHF (congestive heart failure) ([place]) 04/02/2017  . Elevated troponin 04/02/2017  . Thrombocytosis ([place]) 04/02/2017  . Hypokalemia 04/02/2017  . Skin lesion of face   . Nasal lesion     Past Surgical History:  Procedure Laterality Date  . ANKLE CLOSED REDUCTION Right   . COLONOSCOPY N/A 05/31/2017   Procedure: COLONOSCOPY;  Surgeon: Daneil Dolin, MD;  Location: [facility]O SUITE;  Service: Endoscopy;  Laterality: N/A;  2:45pm  . POLYPECTOMY  05/31/2017   Procedure: POLYPECTOMY;  Surgeon: Daneil Dolin, MD;  Location: [facility] ENDO SUITE;  Service: Endoscopy;;  . TONSILLECTOMY          Home Medications    Prior to Admission medications   Medication Sig Start Date End Date Taking? Authorizing Provider  amLODipine (NORVASC) 5 MG tablet Take 1 tablet (5 mg total) by mouth daily. 07/07/17   Rai, Vernelle Emerald, MD  atorvastatin (LIPITOR) 40 MG tablet Take 1 tablet (40 mg total) by mouth daily at 6 PM. 05/23/17   Derek Jack, MD  carvedilol (COREG) 12.5 MG tablet Take 1  tablet (12.5 mg total) by mouth 2 (two) times daily with a meal. 05/23/17   Derek Jack, MD  ferrous sulfate 325 (65 FE) MG tablet Take 1 tablet (325 mg total) by mouth 2 (two) times daily with a meal. 07/07/17   Rai, Ripudeep K, MD  finasteride (PROSCAR) 5 MG tablet Take 5 mg by mouth daily.    [provider]  loperamide (IMODIUM) 2 MG capsule Take 1 capsule (2 mg total) by mouth 3 (three) times daily as needed for diarrhea or loose stools. 07/07/17   Rai, Ripudeep K, MD   ondansetron (ZOFRAN ODT) 4 MG disintegrating tablet Take 1 tablet (4 mg total) by mouth every 8 (eight) hours as needed for nausea or vomiting. 07/07/17   Mendel Corning, MD    Family History Family History  Problem Relation Age of Onset  . Stroke Father   . Cirrhosis Mother   . Colon cancer Neg Hx     Social History Social History   Tobacco Use  . Smoking status: Current Some Day Smoker    Packs/day: 0.10    Years: 40.00    Pack years: 4.00    Types: Cigarettes  . Smokeless tobacco: Never Used  Substance Use Topics  . Alcohol use: Not Currently    Frequency: Never  . Drug use: Never     Allergies   Patient has no known allergies.   Review of Systems Review of Systems  Constitutional:       Per HPI, otherwise negative  HENT:       Per HPI, otherwise negative  Respiratory:       Per HPI, otherwise negative  Cardiovascular:       Per HPI, otherwise negative  Gastrointestinal: Negative for vomiting.  Endocrine:       Negative aside from HPI  Genitourinary:       Neg aside from HPI   Musculoskeletal:       Per HPI, otherwise negative  Skin: Negative.        Skin graft, right face  Neurological: Negative for syncope.     Physical Exam Updated Vital Signs BP (!) 173/81 (BP Location: Right Arm)   Pulse 64   Temp 97.6 F (36.4 C) (Oral)   Resp 12   Wt 79.8 kg (176 lb)   SpO2 100%   BMI 23.87 kg/m   Physical Exam  Constitutional: He is oriented to person, place, and time. He appears well-developed. No distress.  HENT:  Head: Normocephalic and atraumatic.  Eyes: Conjunctivae and EOM are normal.  Cardiovascular: Normal rate and regular rhythm.  Pulmonary/Chest: Effort normal. No stridor. No respiratory distress.  Abdominal: He exhibits no distension. There is no tenderness. There is no guarding.  Musculoskeletal: He exhibits no edema.  Neurological: He is alert and oriented to person, place, and time.  Skin: Skin is warm and dry.  Graft, right face  unremarkable  Psychiatric: He has a normal mood and affect.  Nursing note and vitals reviewed.    ED Treatments / Results  Labs (all labs ordered are listed, but only abnormal results are displayed) Labs Reviewed - No data to display  EKG None  Radiology No results found.  Procedures Procedures (including critical care time)  Medications Ordered in ED Medications - No data to display   Initial Impression / Assessment and Plan / ED Course  I have reviewed the triage vital signs and the nursing notes.  Pertinent labs & imaging results that were available  during my care of the patient were reviewed by me and considered in my medical decision making (see chart for details).     Initial evaluation, we discussed options, including replacement, irrigation, or removal with attempted self voiding. Patient had a strong preference for this.  1:27 PM Patient sitting on edge of his bed. We have remove the Foley catheter, he has been able to urinate spontaneously. He had a lengthy conversation about the need for adequate fluid resuscitation, frequent urination, ibuprofen, urology follow-up. Absent evidence for recurrent retention, and without other complaints, including evidence for infection, the patient was discharged in stable condition.  Final Clinical Impressions(s) / ED Diagnoses  Foley catheter complication   Carmin Muskrat, MD 08/30/17 1328

## 2017-08-30 NOTE — ED Notes (Signed)
Catheter removed by V. Levada Dy EMT Gerda Diss R.N. Witness  Patient given urinal

## 2017-08-30 NOTE — ED Triage Notes (Signed)
Pt had a foley placed 5 weeks ago and is now urinating around the foley. No other complaints

## 2017-09-01 ENCOUNTER — Other Ambulatory Visit: Payer: Self-pay

## 2017-09-01 ENCOUNTER — Inpatient Hospital Stay (HOSPITAL_COMMUNITY)
Admission: EM | Admit: 2017-09-01 | Discharge: 2017-09-02 | DRG: 698 | Disposition: A | Discharge status: Home / Self Care | Payer: Medicaid Other | Attending: Internal Medicine | Admitting: Internal Medicine

## 2017-09-01 ENCOUNTER — Emergency Department (HOSPITAL_COMMUNITY): Payer: Medicaid Other

## 2017-09-01 ENCOUNTER — Encounter (HOSPITAL_COMMUNITY): Payer: Self-pay | Admitting: Emergency Medicine

## 2017-09-01 DIAGNOSIS — A419 Sepsis, unspecified organism: Secondary | ICD-10-CM

## 2017-09-01 DIAGNOSIS — R402362 Coma scale, best motor response, obeys commands, at arrival to emergency department: Secondary | ICD-10-CM | POA: Diagnosis present

## 2017-09-01 DIAGNOSIS — N182 Chronic kidney disease, stage 2 (mild): Secondary | ICD-10-CM | POA: Diagnosis present

## 2017-09-01 DIAGNOSIS — R402142 Coma scale, eyes open, spontaneous, at arrival to emergency department: Secondary | ICD-10-CM | POA: Diagnosis present

## 2017-09-01 DIAGNOSIS — R402252 Coma scale, best verbal response, oriented, at arrival to emergency department: Secondary | ICD-10-CM | POA: Diagnosis present

## 2017-09-01 DIAGNOSIS — C44311 Basal cell carcinoma of skin of nose: Secondary | ICD-10-CM | POA: Diagnosis present

## 2017-09-01 DIAGNOSIS — I471 Supraventricular tachycardia: Secondary | ICD-10-CM | POA: Diagnosis present

## 2017-09-01 DIAGNOSIS — C4491 Basal cell carcinoma of skin, unspecified: Secondary | ICD-10-CM | POA: Diagnosis present

## 2017-09-01 DIAGNOSIS — I130 Hypertensive heart and chronic kidney disease with heart failure and stage 1 through stage 4 chronic kidney disease, or unspecified chronic kidney disease: Secondary | ICD-10-CM | POA: Diagnosis present

## 2017-09-01 DIAGNOSIS — N390 Urinary tract infection, site not specified: Secondary | ICD-10-CM

## 2017-09-01 DIAGNOSIS — D631 Anemia in chronic kidney disease: Secondary | ICD-10-CM | POA: Diagnosis present

## 2017-09-01 DIAGNOSIS — I509 Heart failure, unspecified: Secondary | ICD-10-CM | POA: Diagnosis present

## 2017-09-01 DIAGNOSIS — D638 Anemia in other chronic diseases classified elsewhere: Secondary | ICD-10-CM | POA: Diagnosis present

## 2017-09-01 DIAGNOSIS — R319 Hematuria, unspecified: Secondary | ICD-10-CM | POA: Diagnosis present

## 2017-09-01 DIAGNOSIS — R339 Retention of urine, unspecified: Secondary | ICD-10-CM | POA: Diagnosis present

## 2017-09-01 DIAGNOSIS — I6523 Occlusion and stenosis of bilateral carotid arteries: Secondary | ICD-10-CM | POA: Diagnosis present

## 2017-09-01 DIAGNOSIS — E876 Hypokalemia: Secondary | ICD-10-CM | POA: Diagnosis present

## 2017-09-01 DIAGNOSIS — Y846 Urinary catheterization as the cause of abnormal reaction of the patient, or of later complication, without mention of misadventure at the time of the procedure: Secondary | ICD-10-CM | POA: Diagnosis present

## 2017-09-01 DIAGNOSIS — N401 Enlarged prostate with lower urinary tract symptoms: Secondary | ICD-10-CM | POA: Diagnosis present

## 2017-09-01 DIAGNOSIS — C44111 Basal cell carcinoma of skin of unspecified eyelid, including canthus: Secondary | ICD-10-CM | POA: Diagnosis present

## 2017-09-01 DIAGNOSIS — I493 Ventricular premature depolarization: Secondary | ICD-10-CM | POA: Diagnosis present

## 2017-09-01 DIAGNOSIS — T83511A Infection and inflammatory reaction due to indwelling urethral catheter, initial encounter: Secondary | ICD-10-CM | POA: Diagnosis not present

## 2017-09-01 DIAGNOSIS — Z79899 Other long term (current) drug therapy: Secondary | ICD-10-CM | POA: Diagnosis not present

## 2017-09-01 DIAGNOSIS — F17210 Nicotine dependence, cigarettes, uncomplicated: Secondary | ICD-10-CM | POA: Diagnosis present

## 2017-09-01 DIAGNOSIS — Y732 Prosthetic and other implants, materials and accessory gastroenterology and urology devices associated with adverse incidents: Secondary | ICD-10-CM | POA: Diagnosis present

## 2017-09-01 DIAGNOSIS — R55 Syncope and collapse: Secondary | ICD-10-CM | POA: Diagnosis present

## 2017-09-01 DIAGNOSIS — I10 Essential (primary) hypertension: Secondary | ICD-10-CM | POA: Diagnosis present

## 2017-09-01 DIAGNOSIS — I4581 Long QT syndrome: Secondary | ICD-10-CM | POA: Diagnosis present

## 2017-09-01 DIAGNOSIS — I739 Peripheral vascular disease, unspecified: Secondary | ICD-10-CM

## 2017-09-01 DIAGNOSIS — D649 Anemia, unspecified: Secondary | ICD-10-CM | POA: Diagnosis present

## 2017-09-01 DIAGNOSIS — I779 Disorder of arteries and arterioles, unspecified: Secondary | ICD-10-CM | POA: Diagnosis present

## 2017-09-01 LAB — URINALYSIS, ROUTINE W REFLEX MICROSCOPIC
BILIRUBIN URINE: NEGATIVE
Glucose, UA: NEGATIVE mg/dL
KETONES UR: NEGATIVE mg/dL
Nitrite: NEGATIVE
PH: 7 (ref 5.0–8.0)
Protein, ur: 30 mg/dL — AB
Specific Gravity, Urine: 1.006 (ref 1.005–1.030)
WBC, UA: >50 WBC/hpf — ABNORMAL HIGH (ref 0–5)

## 2017-09-01 LAB — COMPREHENSIVE METABOLIC PANEL
ALBUMIN: 3.7 g/dL (ref 3.5–5.0)
ALT: 26 U/L (ref 0–44)
ANION GAP: 10 (ref 5–15)
AST: 27 U/L (ref 15–41)
Alkaline Phosphatase: 127 U/L — ABNORMAL HIGH (ref 38–126)
BUN: 19 mg/dL (ref 8–23)
CO2: 24 mmol/L (ref 22–32)
Calcium: 9 mg/dL (ref 8.9–10.3)
Chloride: 102 mmol/L (ref 98–111)
Creatinine, Ser: 1.21 mg/dL (ref 0.61–1.24)
GFR calc Af Amer: >60 mL/min (ref >60)
GFR calc non Af Amer: >60 mL/min (ref >60)
Glucose, Bld: 121 mg/dL — ABNORMAL HIGH (ref 70–99)
POTASSIUM: 3.4 mmol/L — AB (ref 3.5–5.1)
SODIUM: 136 mmol/L (ref 135–145)
TOTAL PROTEIN: 7.8 g/dL (ref 6.5–8.1)
Total Bilirubin: 0.9 mg/dL (ref 0.3–1.2)

## 2017-09-01 LAB — CBC WITH DIFFERENTIAL/PLATELET
Basophils Absolute: 0.1 10*3/uL (ref 0.0–0.1)
Basophils Relative: 0 %
EOS ABS: 0.2 10*3/uL (ref 0.0–0.7)
Eosinophils Relative: 2 %
HCT: 37.2 % — ABNORMAL LOW (ref 39.0–52.0)
Hemoglobin: 12 g/dL — ABNORMAL LOW (ref 13.0–17.0)
Lymphocytes Relative: 13 %
Lymphs Abs: 1.8 10*3/uL (ref 0.7–4.0)
MCH: 27.2 pg (ref 26.0–34.0)
MCHC: 32.3 g/dL (ref 30.0–36.0)
MCV: 84.4 fL (ref 78.0–100.0)
MONO ABS: 1.2 10*3/uL — AB (ref 0.1–1.0)
Monocytes Relative: 8 %
Neutro Abs: 10.9 10*3/uL — ABNORMAL HIGH (ref 1.7–7.7)
Neutrophils Relative %: 77 %
Platelets: 358 10*3/uL (ref 150–400)
RBC: 4.41 MIL/uL (ref 4.22–5.81)
RDW: 15.7 % — AB (ref 11.5–15.5)
WBC: 14.2 10*3/uL — ABNORMAL HIGH (ref 4.0–10.5)

## 2017-09-01 LAB — I-STAT TROPONIN, ED: Troponin i, poc: 0.04 ng/mL (ref 0.00–0.08)

## 2017-09-01 LAB — LACTIC ACID, PLASMA
Lactic Acid, Venous: 2.7 mmol/L (ref 0.5–1.9)
Lactic Acid, Venous: 0.8 mmol/L (ref 0.5–1.9)

## 2017-09-01 MED ORDER — SODIUM CHLORIDE 0.9 % IV SOLN
2.0000 g | Freq: Once | INTRAVENOUS | Status: AC
  Administered 2017-09-01: 2 g via INTRAVENOUS
  Filled 2017-09-01: qty 2

## 2017-09-01 MED ORDER — ATORVASTATIN CALCIUM 40 MG PO TABS
40.0000 mg | ORAL_TABLET | Freq: Every day | ORAL | Status: DC
  Administered 2017-09-01: 40 mg via ORAL
  Filled 2017-09-01: qty 1

## 2017-09-01 MED ORDER — AMLODIPINE BESYLATE 5 MG PO TABS
5.0000 mg | ORAL_TABLET | Freq: Every day | ORAL | Status: DC
  Administered 2017-09-01 – 2017-09-02 (×2): 5 mg via ORAL
  Filled 2017-09-01 (×2): qty 1

## 2017-09-01 MED ORDER — SODIUM CHLORIDE 0.9 % IV SOLN
1.0000 g | Freq: Three times a day (TID) | INTRAVENOUS | Status: DC
  Administered 2017-09-01 – 2017-09-02 (×3): 1 g via INTRAVENOUS
  Filled 2017-09-01 (×10): qty 1

## 2017-09-01 MED ORDER — CARVEDILOL 12.5 MG PO TABS
12.5000 mg | ORAL_TABLET | Freq: Two times a day (BID) | ORAL | Status: DC
  Administered 2017-09-01 – 2017-09-02 (×3): 12.5 mg via ORAL
  Filled 2017-09-01 (×2): qty 1
  Filled 2017-09-01: qty 4

## 2017-09-01 MED ORDER — ONDANSETRON HCL 4 MG/2ML IJ SOLN: 4.0000 mg | Freq: Four times a day (QID) | INTRAMUSCULAR | Status: DC | PRN

## 2017-09-01 MED ORDER — ONDANSETRON 4 MG PO TBDP: 4.0000 mg | ORAL_TABLET | Freq: Three times a day (TID) | ORAL | Status: DC | PRN

## 2017-09-01 MED ORDER — FERROUS SULFATE 325 (65 FE) MG PO TABS
325.0000 mg | ORAL_TABLET | Freq: Two times a day (BID) | ORAL | Status: DC
  Administered 2017-09-01 – 2017-09-02 (×3): 325 mg via ORAL
  Filled 2017-09-01 (×3): qty 1

## 2017-09-01 MED ORDER — ONDANSETRON HCL 4 MG PO TABS: 4.0000 mg | ORAL_TABLET | Freq: Four times a day (QID) | ORAL | Status: DC | PRN

## 2017-09-01 MED ORDER — HYDRALAZINE HCL 20 MG/ML IJ SOLN
10.0000 mg | INTRAMUSCULAR | Status: DC | PRN
  Administered 2017-09-01: 10 mg via INTRAVENOUS
  Filled 2017-09-01: qty 1

## 2017-09-01 MED ORDER — POTASSIUM CHLORIDE IN NACL 40-0.9 MEQ/L-% IV SOLN
INTRAVENOUS | Status: AC
  Administered 2017-09-01: 125 mL/h via INTRAVENOUS

## 2017-09-01 MED ORDER — ENOXAPARIN SODIUM 40 MG/0.4ML ~~LOC~~ SOLN
40.0000 mg | SUBCUTANEOUS | Status: DC
  Administered 2017-09-02: 40 mg via SUBCUTANEOUS
  Filled 2017-09-01 (×2): qty 0.4

## 2017-09-01 MED ORDER — FINASTERIDE 5 MG PO TABS
5.0000 mg | ORAL_TABLET | Freq: Every day | ORAL | Status: DC
  Administered 2017-09-01 – 2017-09-02 (×2): 5 mg via ORAL
  Filled 2017-09-01 (×4): qty 1

## 2017-09-01 MED ORDER — ACETAMINOPHEN 650 MG RE SUPP: 650.0000 mg | Freq: Four times a day (QID) | RECTAL | Status: DC | PRN

## 2017-09-01 MED ORDER — LOPERAMIDE HCL 2 MG PO CAPS: 2.0000 mg | ORAL_CAPSULE | Freq: Three times a day (TID) | ORAL | Status: DC | PRN

## 2017-09-01 MED ORDER — ACETAMINOPHEN 325 MG PO TABS: 650.0000 mg | ORAL_TABLET | Freq: Four times a day (QID) | ORAL | Status: DC | PRN

## 2017-09-01 MED ORDER — SODIUM CHLORIDE 0.9 % IV BOLUS
1000.0000 mL | Freq: Once | INTRAVENOUS | Status: AC
  Administered 2017-09-01: 1000 mL via INTRAVENOUS

## 2017-09-01 NOTE — Progress Notes (Signed)
Pharmacy Antibiotic Note  Michael [name] is a 62 y.o. male admitted on 09/01/2017 with UTI.  Pharmacy has been consulted for Cefepime dosing.  Plan: Cefepime 1000 mg IV every 8 hours  Monitor labs, c/s, and patient improvement.  Height: 6' (182.9 cm) Weight: 171 lb 1.2 oz (77.6 kg) IBW/kg (Calculated) : 77.6  Temp (24hrs), Avg:98.2 F (36.8 C), Min:98.2 F (36.8 C), Max:98.2 F (36.8 C)  Recent Labs  Lab 09/01/17 0312 09/01/17 0422  WBC 14.2*  --   CREATININE 1.21  --   LATICACIDVEN  --  2.7*    Estimated Creatinine Clearance: 68.6 mL/min (by C-G formula based on SCr of 1.21 mg/dL).    No Known Allergies  Antimicrobials this admission: Cefepime 8/2 >>     Dose adjustments this admission: N/A  Microbiology results: 8/2 BCx: pending 8/2 UCx: pending    Thank you for allowing pharmacy to be a part of this patient's care.  Ramond Craver 09/01/2017 7:39 AM

## 2017-09-01 NOTE — ED Provider Notes (Signed)
[facility] EMERGENCY DEPARTMENT Provider Note   CSN: [id number] Arrival date & time: 09/01/17  0259     History   Chief Complaint Chief Complaint  Patient presents with  . Loss of Consciousness    HPI Michael [name] is a 62 y.o. male.  Patient presents to the emergency department for evaluation of syncopal episode.  Patient reports that he was having a bowel movement when he passed out.  Patient has been experiencing urinary retention and progressively worsening bladder area pain today.  He has a history of urinary retention, had a Foley catheter in place for 5 weeks and it was removed 2 days ago.  EMS reports that the patient has been awake and alert, blood pressure has been markedly elevated.     Past Medical History:  Diagnosis Date  . Acute urinary retention    followed by OP urology   . Carotid stenosis   . CHF (congestive heart failure) (HCC)    LVEF 50-55% 03/2017  . CKD (chronic kidney disease)    Stage II  . Head and neck cancer ([place]) 2019   s/p right facial basal cell carcinoma s/p resection with right partial mastectomy and partal rhinectomy with skin graft (06/13/17)  . HTN (hypertension)   . Iron deficiency anemia   . Tobacco abuse     Patient Active Problem List   Diagnosis Date Noted  . Syncope and collapse 07/05/2017  . COPD exacerbation ([place]) 07/05/2017  . Acute renal failure superimposed on chronic kidney disease ([place]) 07/05/2017  . Catheter-associated urinary tract infection ([place]) 07/05/2017  . Leukocytosis 07/05/2017  . Urinary retention with incomplete bladder emptying 07/05/2017  . UTI (urinary tract infection) 07/05/2017  . Diarrhea 07/05/2017  . Carotid artery disease ([place]) 06/25/2017  . AKI (acute kidney injury) ([place])   . Orthostatic syncope   . Syncope due to orthostatic hypotension 06/24/2017  . Heart murmur 06/24/2017  . Abnormal EKG 06/24/2017  . Abnormal PET scan of colon 05/25/2017  . Basal cell carcinoma (BCC) of nasolabial  groove 05/15/2017  . Periapical abscess 04/04/2017  . Iron (Fe) deficiency anemia 04/04/2017  . Acute diastolic CHF (congestive heart failure) ([place]) 04/04/2017  . Essential hypertension 04/02/2017  . Anemia 04/02/2017  . Acute CHF (congestive heart failure) ([place]) 04/02/2017  . Acute hypoxemic respiratory failure ([place]) 04/02/2017  . Lactic acidosis 04/02/2017  . Tobacco abuse 04/02/2017  . CHF (congestive heart failure) ([place]) 04/02/2017  . Elevated troponin 04/02/2017  . Thrombocytosis ([place]) 04/02/2017  . Hypokalemia 04/02/2017  . Skin lesion of face   . Nasal lesion     Past Surgical History:  Procedure Laterality Date  . ANKLE CLOSED REDUCTION Right   . COLONOSCOPY N/A 05/31/2017   Procedure: COLONOSCOPY;  Surgeon: Daneil Dolin, MD;  [facility];  Service: Endoscopy;  Laterality: N/A;  2:45pm  . POLYPECTOMY  05/31/2017   Procedure: POLYPECTOMY;  Surgeon: Daneil Dolin, MD;  Location: [facility];  Service: Endoscopy;;  . TONSILLECTOMY          Home Medications    Prior to Admission medications   Medication Sig Start Date End Date Taking? Authorizing Provider  amLODipine (NORVASC) 5 MG tablet Take 1 tablet (5 mg total) by mouth daily. 07/07/17   Rai, Vernelle Emerald, MD  atorvastatin (LIPITOR) 40 MG tablet Take 1 tablet (40 mg total) by mouth daily at 6 PM. 05/23/17   Derek Jack, MD  carvedilol (COREG) 12.5 MG tablet Take 1 tablet (12.5 mg  total) by mouth 2 (two) times daily with a meal. 05/23/17   Derek Jack, MD  ferrous sulfate 325 (65 FE) MG tablet Take 1 tablet (325 mg total) by mouth 2 (two) times daily with a meal. 07/07/17   Rai, Ripudeep K, MD  finasteride (PROSCAR) 5 MG tablet Take 5 mg by mouth daily.    [provider]  loperamide (IMODIUM) 2 MG capsule Take 1 capsule (2 mg total) by mouth 3 (three) times daily as needed for diarrhea or loose stools. 07/07/17   Rai, Ripudeep K, MD  ondansetron (ZOFRAN ODT) 4 MG disintegrating tablet  Take 1 tablet (4 mg total) by mouth every 8 (eight) hours as needed for nausea or vomiting. 07/07/17   Mendel Corning, MD    Family History Family History  Problem Relation Age of Onset  . Stroke Father   . Cirrhosis Mother   . Colon cancer Neg Hx     Social History Social History   Tobacco Use  . Smoking status: Current Some Day Smoker    Packs/day: 0.10    Years: 40.00    Pack years: 4.00    Types: Cigarettes  . Smokeless tobacco: Never Used  Substance Use Topics  . Alcohol use: Not Currently    Frequency: Never  . Drug use: Never     Allergies   Patient has no known allergies.   Review of Systems Review of Systems  Genitourinary: Positive for decreased urine volume.  Neurological: Positive for syncope.  All other systems reviewed and are negative.    Physical Exam Updated Vital Signs BP (!) 149/83   Pulse 97   Resp 17   Wt 79.8 kg (176 lb)   SpO2 95%   BMI 23.87 kg/m   Physical Exam  Constitutional: He is oriented to person, place, and time. He appears well-developed and well-nourished. No distress.  HENT:  Head: Normocephalic and atraumatic.  Right Ear: Hearing normal.  Left Ear: Hearing normal.  Nose: Nose normal.  Mouth/Throat: Oropharynx is clear and moist and mucous membranes are normal.  Eyes: Pupils are equal, round, and reactive to light. Conjunctivae and EOM are normal.  Neck: Normal range of motion. Neck supple.  Cardiovascular: Regular rhythm, S1 normal and S2 normal. Exam reveals no gallop and no friction rub.  No murmur heard. Pulmonary/Chest: Effort normal and breath sounds normal. No respiratory distress. He exhibits no tenderness.  Abdominal: Soft. Normal appearance and bowel sounds are normal. There is no hepatosplenomegaly. There is tenderness in the suprapubic area. There is no rebound, no guarding, no tenderness at McBurney's point and negative Murphy's sign. No hernia.  Musculoskeletal: Normal range of motion.  Neurological: He  is alert and oriented to person, place, and time. He has normal strength. No cranial nerve deficit or sensory deficit. Coordination normal. GCS eye subscore is 4. GCS verbal subscore is 5. GCS motor subscore is 6.  Skin: Skin is warm, dry and intact. No rash noted. No cyanosis.  Psychiatric: He has a normal mood and affect. His speech is normal and behavior is normal. Thought content normal.  Nursing note and vitals reviewed.    ED Treatments / Results  Labs (all labs ordered are listed, but only abnormal results are displayed) Labs Reviewed  CBC WITH DIFFERENTIAL/PLATELET - Abnormal; Notable for the following components:      Result Value   WBC 14.2 (*)    Hemoglobin 12.0 (*)    HCT 37.2 (*)    RDW 15.7 (*)  Neutro Abs 10.9 (*)    Monocytes Absolute 1.2 (*)    All other components within normal limits  COMPREHENSIVE METABOLIC PANEL - Abnormal; Notable for the following components:   Potassium 3.4 (*)    Glucose, Bld 121 (*)    Alkaline Phosphatase 127 (*)    All other components within normal limits  URINALYSIS, ROUTINE W REFLEX MICROSCOPIC - Abnormal; Notable for the following components:   APPearance HAZY (*)    Hgb urine dipstick MODERATE (*)    Protein, ur 30 (*)    Leukocytes, UA LARGE (*)    WBC, UA >50 (*)    Bacteria, UA FEW (*)    All other components within normal limits  LACTIC ACID, PLASMA - Abnormal; Notable for the following components:   Lactic Acid, Venous 2.7 (*)    All other components within normal limits  CULTURE, BLOOD (ROUTINE X 2)  CULTURE, BLOOD (ROUTINE X 2)  I-STAT TROPONIN, ED    EKG EKG Interpretation  Date/Time:  Friday September 01 2017 03:03:30 EDT Ventricular Rate:  118 PR Interval:    QRS Duration: 99 QT Interval:  365 QTC Calculation: 512 R Axis:   -26 Text Interpretation:  Sinus or ectopic atrial tachycardia Multiform ventricular premature complexes Borderline left axis deviation Anterior infarct, old Borderline repolarization  abnormality Prolonged QT interval Baseline wander in lead(s) V1 Confirmed by Orpah Greek [phone]) on 09/01/2017 4:31:43 AM   Radiology Dg Chest 2 View  Result Date: 09/01/2017 CLINICAL DATA:  Syncope. EXAM: CHEST - 2 VIEW COMPARISON:  Radiograph 07/04/2017.  PET CT 04/28/2017 FINDINGS: The cardiomediastinal contours are normal. The lungs are clear. Pulmonary vasculature is normal. No consolidation, pleural effusion, or pneumothorax. No acute osseous abnormalities are seen. IMPRESSION: No acute pulmonary process. Electronically Signed   By: Jeb Levering M.D.   On: 09/01/2017 05:53   Ct Head Wo Contrast  Result Date: 09/01/2017 CLINICAL DATA:  Syncopal episode with bowel movement. History of hypertension, carotid stenosis. EXAM: CT HEAD WITHOUT CONTRAST TECHNIQUE: Contiguous axial images were obtained from the base of the skull through the vertex without intravenous contrast. COMPARISON:  CT HEAD July 04, 2017 FINDINGS: BRAIN: No intraparenchymal hemorrhage, mass effect nor midline shift. Mild parenchymal brain volume loss. No hydrocephalus. Confluent supratentorial white matter hypodensities. Old basal ganglia and RIGHT thalamus lacunar infarcts. No acute large vascular territory infarcts. No abnormal extra-axial fluid collections. Basal cisterns are patent. VASCULAR: Moderate calcific atherosclerosis of the carotid siphons. SKULL: No skull fracture. Old RIGHT anterior maxillary wall ORIF. No significant scalp soft tissue swelling. LEFT suboccipital tricholemmal cyst. SINUSES/ORBITS: The mastoid air-cells and included paranasal sinuses are well-aerated.The included ocular globes and orbital contents are non-suspicious. OTHER: None. IMPRESSION: 1. No acute intracranial process. 2. Severe chronic small vessel ischemic changes and old lacunar infarcts. 3. Mild parenchymal brain volume loss. Electronically Signed   By: Elon Alas M.D.   On: 09/01/2017 05:47    Procedures Procedures  (including critical care time)  Medications Ordered in ED Medications  ceFEPIme (MAXIPIME) 2 g in sodium chloride 0.9 % 100 mL IVPB (2 g Intravenous New Bag/Given 09/01/17 0550)  sodium chloride 0.9 % bolus 1,000 mL (1,000 mLs Intravenous New Bag/Given 09/01/17 0550)     Initial Impression / Assessment and Plan / ED Course  I have reviewed the triage vital signs and the nursing notes.  Pertinent labs & imaging results that were available during my care of the patient were reviewed by me and considered in my  medical decision making (see chart for details).    Patient presents to the emergency department for evaluation of syncope.  Syncope occurred just prior to coming to the ER by ambulance.  Patient does not have any evidence of injury from the syncope.  He has no focal neurologic findings.  Patient reports that he has noticed progressive urinary retention over the course of the day.  He just had a chronic indwelling Foley catheter removed 2 days ago and now presents with outlet obstruction.  Catheter was replaced and patient put out nearly 1 L of urine.  Urinalysis is suspicious for infection.  Patient was hospitalized in June with a similar presentation including syncope and had a Klebsiella urinary tract infection.  He has an elevated white blood cell count, elevated lactic acid.  He had tachycardia and significant hypertension on arrival.  He was covered empirically for sepsis secondary to UTI.  Patient's blood pressure has significantly improved after the Foley catheter was replaced.  He has been hydrated and his heart rate is improving.  We will admit the patient for further management.  CRITICAL CARE Performed by: Orpah Greek   Total critical care time: 30 minutes  Critical care time was exclusive of separately billable procedures and treating other patients.  Critical care was necessary to treat or prevent imminent or life-threatening deterioration.  Critical care was  time spent personally by me on the following activities: development of treatment plan with patient and/or surrogate as well as nursing, discussions with consultants, evaluation of patient's response to treatment, examination of patient, obtaining history from patient or surrogate, ordering and performing treatments and interventions, ordering and review of laboratory studies, ordering and review of radiographic studies, pulse oximetry and re-evaluation of patient's condition.    Final Clinical Impressions(s) / ED Diagnoses   Final diagnoses:  Syncope, unspecified syncope type  Urinary tract infection with hematuria, site unspecified  Sepsis, due to unspecified organism [facility])    ED Discharge Orders    None       Orpah Greek, MD 09/01/17 [id number]

## 2017-09-01 NOTE — Progress Notes (Signed)
Pt refuses for bed alarm to be turned on. States "I'm not going to fall. I just passed out yesterday because I was straining". Pt encouraged to call if needing to get OOB. Pt agrees to call if needed. Pt observed ambulating in room with steady gait. Call bell within reach. Frequent rounds.

## 2017-09-01 NOTE — Care Management Note (Signed)
Case Management Note  Patient Details  Name: Michael [name] MRN: [id number] Date of Birth: [birth year]  Subjective/Objective:     Admitted with UTI. Pt from home, lives with alone, only support person is his landlord who takes him to appointments. 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 Pt says transportation to [facility] would be a problem, CM explained he may be able to get closer clinic placement like [place] or [place]. Pt uninsured, unemployed. Has applied for medicaid and they have requested more documentation. Pt is followed by Dr. Raliegh Ip in the [facility] specialty clinic. Pt was previously with [facility], was referred by [facility] and given 3 visit LOG. Dr. Raliegh Ip was the MD signing orders for them. Pt says they were helpful, he would like them to come back.               Action/Plan: CM has again instructed pt to follow up with [place] left on AV[facility]. CM has spoken with [employer] rep, they will be unable to take pt back onto services. At this time pt does not appear to have any acute HH needs, so charity services will not be pursued with any other [facility] agency. CM will cont to follow for DC planning needs.     Expected Discharge Date:  09/04/17               Expected Discharge Plan:  Home/Self Care  In-House Referral:  NA  Discharge planning Services  CM Consult  Post Acute Care Choice:  NA Choice offered to:  NA  DME Arranged:    DME Agency:     HH Arranged:    HH Agency:     Status of Service:  In process, will continue to follow  If discussed at Long Length of Stay Meetings, dates discussed:    Additional Comments:  Sherald Barge, RN 09/01/2017, 12:50 PM

## 2017-09-01 NOTE — H&P (Signed)
History and Physical    Michael [name] QHU:[id number] DOB: November 06, 1955 DOA: 09/01/2017  PCP: Patient, No Pcp Per   Patient coming from: Home  Chief Complaint: Loss of consciousness  HPI: Michael [name] is a 62 y.o. male with medical history significant for hypertension, BPH with urinary retention, chronic anemia, CKD stage II, carotid stenosis right greater than left, and right facial basal cell carcinoma status post resection with skin graft who presented to the ED after syncopal episode at home after he was bearing down to pass urine.  Apparently he had a Foley catheter for approximately 5 weeks and this was removed 2 days ago.  He states that he did not injure himself in any way and ultimately regained consciousness spontaneously at which point he called EMS.  He seems to think that he had lost consciousness for approximately 5 minutes.  He states that he takes his blood pressure medications routinely and does not miss doses. He denies any headache, visual changes, chest pain, palpitations, nausea, vomiting, or flushing prior to the episode.   ED Course: Vital signs are stable with noted elevated blood pressure readings in the 465 systolic range.  EKG was sinus tachycardia and some ectopy at 118 bpm.  Laboratory data with leukocytosis of 14,200 as well as lactic acid level of 2.7.  Hemoglobin stable at 12 and potassium mildly diminished at 3.4.  His Foley was replaced on account of urinary retention and chest x-ray demonstrates no acute findings.  CT of the head with chronic small vessel ischemic changes and small volume loss but no acute findings.  Review of Systems: All others reviewed and otherwise negative.  Past Medical History:  Diagnosis Date  . Acute urinary retention    followed by OP urology   . Carotid stenosis   . CHF (congestive heart failure) (HCC)    LVEF 50-55% 03/2017  . CKD (chronic kidney disease)    Stage II  . Head and neck cancer ([place]) 2019   s/p right facial basal  cell carcinoma s/p resection with right partial mastectomy and partal rhinectomy with skin graft (06/13/17)  . HTN (hypertension)   . Iron deficiency anemia   . Tobacco abuse     Past Surgical History:  Procedure Laterality Date  . ANKLE CLOSED REDUCTION Right   . COLONOSCOPY N/A 05/31/2017   Procedure: COLONOSCOPY;  Surgeon: Daneil Dolin, MD;  [facility];  Service: Endoscopy;  Laterality: N/A;  2:45pm  . POLYPECTOMY  05/31/2017   Procedure: POLYPECTOMY;  Surgeon: Daneil Dolin, MD;  Location: [facility];  Service: Endoscopy;;  . TONSILLECTOMY       reports that he has been smoking cigarettes.  He has a 4.00 pack-year smoking history. He has never used smokeless tobacco. He reports that he drank alcohol. He reports that he does not use drugs.  No Known Allergies  Family History  Problem Relation Age of Onset  . Stroke Father   . Cirrhosis Mother   . Colon cancer Neg Hx     Prior to Admission medications   Medication Sig Start Date End Date Taking? Authorizing Provider  amLODipine (NORVASC) 5 MG tablet Take 1 tablet (5 mg total) by mouth daily. 07/07/17  Yes Rai, Ripudeep K, MD  atorvastatin (LIPITOR) 40 MG tablet Take 1 tablet (40 mg total) by mouth daily at 6 PM. 05/23/17  Yes Derek Jack, MD  carvedilol (COREG) 12.5 MG tablet Take 1 tablet (12.5 mg total) by mouth 2 (two) times daily  with a meal. 05/23/17  Yes Derek Jack, MD  ferrous sulfate 325 (65 FE) MG tablet Take 1 tablet (325 mg total) by mouth 2 (two) times daily with a meal. 07/07/17  Yes Rai, Ripudeep K, MD  finasteride (PROSCAR) 5 MG tablet Take 5 mg by mouth daily.   Yes [provider]  loperamide (IMODIUM) 2 MG capsule Take 1 capsule (2 mg total) by mouth 3 (three) times daily as needed for diarrhea or loose stools. 07/07/17  Yes Rai, Ripudeep K, MD  ondansetron (ZOFRAN ODT) 4 MG disintegrating tablet Take 1 tablet (4 mg total) by mouth every 8 (eight) hours as needed for nausea  or vomiting. 07/07/17  Yes Rai, Vernelle Emerald, MD    Physical Exam: Vitals:   09/01/17 0500 09/01/17 0600 09/01/17 0627 09/01/17 0649  BP: (!) 149/83 (!) 173/84  (!) 184/88  Pulse: 97 90 93 88  Resp: 17  16 18   Temp:    98.2 F (36.8 C)  TempSrc:    Oral  SpO2: 95% 96% 97% 99%  Weight:    77.6 kg (171 lb 1.2 oz)  Height:    6' (1.829 m)    Constitutional: NAD, calm, comfortable Vitals:   09/01/17 0500 09/01/17 0600 09/01/17 0627 09/01/17 0649  BP: (!) 149/83 (!) 173/84  (!) 184/88  Pulse: 97 90 93 88  Resp: 17  16 18   Temp:    98.2 F (36.8 C)  TempSrc:    Oral  SpO2: 95% 96% 97% 99%  Weight:    77.6 kg (171 lb 1.2 oz)  Height:    6' (1.829 m)   Eyes: lids and conjunctivae normal ENMT: Mucous membranes are moist.  Neck: normal, supple Respiratory: clear to auscultation bilaterally. Normal respiratory effort. No accessory muscle use.  Cardiovascular: Regular rate and rhythm, no murmurs. No extremity edema. Abdomen: no tenderness, no distention. Bowel sounds positive.  Musculoskeletal:  No joint deformity upper and lower extremities.   Skin: Facial region with skin graft to right nasolabial fold. Psychiatric: Normal judgment and insight. Alert and oriented x 3. Normal mood.  Foley with clear, yellow, urine output.  Labs on Admission: I have personally reviewed following labs and imaging studies  CBC: Recent Labs  Lab 09/01/17 0312  WBC 14.2*  NEUTROABS 10.9*  HGB 12.0*  HCT 37.2*  MCV 84.4  PLT 818   Basic Metabolic Panel: Recent Labs  Lab 09/01/17 0312  NA 136  K 3.4*  CL 102  CO2 24  GLUCOSE 121*  BUN 19  CREATININE 1.21  CALCIUM 9.0   GFR: Estimated Creatinine Clearance: 68.6 mL/min (by C-G formula based on SCr of 1.21 mg/dL). Liver Function Tests: Recent Labs  Lab 09/01/17 0312  AST 27  ALT 26  ALKPHOS 127*  BILITOT 0.9  PROT 7.8  ALBUMIN 3.7   No results for input(s): LIPASE, AMYLASE in the last 168 hours. No results for input(s): AMMONIA  in the last 168 hours. Coagulation Profile: No results for input(s): INR, PROTIME in the last 168 hours. Cardiac Enzymes: No results for input(s): CKTOTAL, CKMB, CKMBINDEX, TROPONINI in the last 168 hours. BNP (last 3 results) No results for input(s): PROBNP in the last 8760 hours. HbA1C: No results for input(s): HGBA1C in the last 72 hours. CBG: No results for input(s): GLUCAP in the last 168 hours. Lipid Profile: No results for input(s): CHOL, HDL, LDLCALC, TRIG, CHOLHDL, LDLDIRECT in the last 72 hours. Thyroid Function Tests: No results for input(s): TSH, T4TOTAL, FREET4,  T3FREE, THYROIDAB in the last 72 hours. Anemia Panel: No results for input(s): VITAMINB12, FOLATE, FERRITIN, TIBC, IRON, RETICCTPCT in the last 72 hours. Urine analysis:    Component Value Date/Time   COLORURINE YELLOW 09/01/2017 0345   APPEARANCEUR HAZY (A) 09/01/2017 0345   APPEARANCEUR Cloudy 04/23/2011 1044   LABSPEC 1.006 09/01/2017 0345   LABSPEC 1.018 04/23/2011 1044   PHURINE 7.0 09/01/2017 0345   GLUCOSEU NEGATIVE 09/01/2017 0345   GLUCOSEU Negative 04/23/2011 1044   HGBUR MODERATE (A) 09/01/2017 0345   BILIRUBINUR NEGATIVE 09/01/2017 0345   BILIRUBINUR Negative 04/23/2011 1044   KETONESUR NEGATIVE 09/01/2017 0345   PROTEINUR 30 (A) 09/01/2017 0345   NITRITE NEGATIVE 09/01/2017 0345   LEUKOCYTESUR LARGE (A) 09/01/2017 0345   LEUKOCYTESUR Trace 04/23/2011 1044    Radiological Exams on Admission: Dg Chest 2 View  Result Date: 09/01/2017 CLINICAL DATA:  Syncope. EXAM: CHEST - 2 VIEW COMPARISON:  Radiograph 07/04/2017.  PET CT 04/28/2017 FINDINGS: The cardiomediastinal contours are normal. The lungs are clear. Pulmonary vasculature is normal. No consolidation, pleural effusion, or pneumothorax. No acute osseous abnormalities are seen. IMPRESSION: No acute pulmonary process. Electronically Signed   By: Jeb Levering M.D.   On: 09/01/2017 05:53   Ct Head Wo Contrast  Result Date:  09/01/2017 CLINICAL DATA:  Syncopal episode with bowel movement. History of hypertension, carotid stenosis. EXAM: CT HEAD WITHOUT CONTRAST TECHNIQUE: Contiguous axial images were obtained from the base of the skull through the vertex without intravenous contrast. COMPARISON:  CT HEAD July 04, 2017 FINDINGS: BRAIN: No intraparenchymal hemorrhage, mass effect nor midline shift. Mild parenchymal brain volume loss. No hydrocephalus. Confluent supratentorial white matter hypodensities. Old basal ganglia and RIGHT thalamus lacunar infarcts. No acute large vascular territory infarcts. No abnormal extra-axial fluid collections. Basal cisterns are patent. VASCULAR: Moderate calcific atherosclerosis of the carotid siphons. SKULL: No skull fracture. Old RIGHT anterior maxillary wall ORIF. No significant scalp soft tissue swelling. LEFT suboccipital tricholemmal cyst. SINUSES/ORBITS: The mastoid air-cells and included paranasal sinuses are well-aerated.The included ocular globes and orbital contents are non-suspicious. OTHER: None. IMPRESSION: 1. No acute intracranial process. 2. Severe chronic small vessel ischemic changes and old lacunar infarcts. 3. Mild parenchymal brain volume loss. Electronically Signed   By: Elon Alas M.D.   On: 09/01/2017 05:47    EKG: Independently reviewed. ST 118bpm with some ectopy.  Assessment/Plan Principal Problem:   Sepsis secondary to UTI [facility]) Active Problems:   Essential hypertension   Anemia   Hypokalemia   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   Carotid artery disease (HCC)   Syncope and collapse   Urinary retention with incomplete bladder emptying    1. Syncope with collapse likely multifactorial.  I believe this is acutely related to sepsis with his repeat urinary tract infection that is catheter associated as well as some vasovagal influence given that he was trying to bear down to pass urine.  He also appears to have uncontrolled blood pressure  readings.  Will check orthostatics this morning and patient has had recent echocardiogram on 07/2017 with LVEF of 55 to 60%.  He has also had recent ultrasound on 05/2017 with significant stenosis of the right ICA of 70 to 99% noted at that time.  Will speak with vascular surgery for outpatient follow-up.  Continue telemetry monitoring 2. Sepsis secondary to UTI.  Unfortunately urine cultures have not been obtained at this time and I will order them.  Continue on cefepime IV.  Maintain on IV fluid and  recheck lactic acid in a.m. to ensure downward trend.  Repeat CBC in a.m.  He has had prior Klebsiella UTI. 3. Acute urinary retention.  Patient has history of chronic Foley catheter use and BPH.  Continue home finasteride and maintain on Foley catheter.  Will need to be followed up outpatient with urology. 4. Mild hypokalemia.  Continue repletion with IV fluid and recheck in a.m. along with magnesium. 5. Hypertension.  This appears to be poorly controlled.  Will restart home medications and continue to monitor.  Hydralazine as needed. 6. Anemia.  Chronic and appears to be stable with no acute bleeding.  Recheck CBC in a.m.   DVT prophylaxis: Lovenox Code Status: Full Family Communication: None at bedside Disposition Plan:UTI/Sepsis treatment; evaluation of syncope Consults called:None Admission status: Inpatient, Tele   Lachrista Heslin Darleen Crocker DO [facility] Pager [phone]  If 7PM-7AM, please contact night-coverage [email or web address] Password [email or web address]  09/01/2017, 8:30 AM

## 2017-09-01 NOTE — ED Triage Notes (Signed)
Pt had syncopal episode while trying to have a bowel movement. Per ems pt is hypertension. Pt c/o groin pain and had catheter removed 2 days ago.

## 2017-09-01 NOTE — ED Notes (Signed)
Bladder Scan Performed Post-Catheter Insertion. 179ml Residual Urine Measured.

## 2017-09-01 NOTE — ED Notes (Signed)
Bladder Scan Performed. 880ml Residual Urine Measured.

## 2017-09-01 NOTE — ED Notes (Signed)
Date and time results received: 09/01/17 0513   Test: Lactic Acid Critical Value: 2.7  Name of Provider Notified: Betsey Holiday, MD

## 2017-09-02 LAB — BASIC METABOLIC PANEL
ANION GAP: 5 (ref 5–15)
BUN: 16 mg/dL (ref 8–23)
CALCIUM: 8.6 mg/dL — AB (ref 8.9–10.3)
CO2: 26 mmol/L (ref 22–32)
CREATININE: 1.07 mg/dL (ref 0.61–1.24)
Chloride: 111 mmol/L (ref 98–111)
GFR calc Af Amer: >60 mL/min (ref >60)
Glucose, Bld: 90 mg/dL (ref 70–99)
Potassium: 3.5 mmol/L (ref 3.5–5.1)
Sodium: 142 mmol/L (ref 135–145)

## 2017-09-02 LAB — CBC
HCT: 32.5 % — ABNORMAL LOW (ref 39.0–52.0)
Hemoglobin: 10 g/dL — ABNORMAL LOW (ref 13.0–17.0)
MCH: 26.6 pg (ref 26.0–34.0)
MCHC: 30.8 g/dL (ref 30.0–36.0)
MCV: 86.4 fL (ref 78.0–100.0)
PLATELETS: 283 10*3/uL (ref 150–400)
RBC: 3.76 MIL/uL — ABNORMAL LOW (ref 4.22–5.81)
RDW: 16.1 % — AB (ref 11.5–15.5)
WBC: 8.3 10*3/uL (ref 4.0–10.5)

## 2017-09-02 LAB — URINE CULTURE

## 2017-09-02 LAB — MAGNESIUM: Magnesium: 2.1 mg/dL (ref 1.7–2.4)

## 2017-09-02 LAB — LACTIC ACID, PLASMA: LACTIC ACID, VENOUS: 0.8 mmol/L (ref 0.5–1.9)

## 2017-09-02 MED ORDER — FINASTERIDE 5 MG PO TABS
5.0000 mg | ORAL_TABLET | Freq: Every day | ORAL | 0 refills | Status: DC
Start: 2017-09-03 — End: 2017-11-27

## 2017-09-02 MED ORDER — CEPHALEXIN 500 MG PO CAPS: 500.0000 mg | ORAL_CAPSULE | Freq: Four times a day (QID) | ORAL | 0 refills | Status: AC

## 2017-09-02 NOTE — Progress Notes (Signed)
IV discontinued,catheter intact. Discharge instructions given on medications and follow up visits, patient verbalized understanding. Prescriptions sent to Pharmacy of choice documented on AVS. Accompanied by to an awaiting vehicle.

## 2017-09-02 NOTE — Discharge Summary (Signed)
Physician Discharge Summary  Michael [name] VOJ:[id number] DOB: [birth year] DOA: 09/01/2017  PCP: Patient, No Pcp Per  Admit date: 09/01/2017  Discharge date: 09/02/2017  Admitted From:Home  Disposition:  Home  Recommendations for Outpatient Follow-up:  1. Follow up with PCP in 1-2 weeks at the [facility]; info provided by Care Management 2. Maintain indwelling F[facility] with Urine culture follow up 3. Continue Keflex for 5 more days.  Home Health:N/A  Equipment/Devices:N/A  Discharge Condition:Stable  CODE STATUS: Full  Diet recommendation: Heart Healthy  Brief/Interim Summary:  Michael [name] is a 62 y.o. male with medical history significant for hypertension, BPH with urinary retention, chronic anemia, CKD stage II, carotid stenosis right greater than left, and right facial basal cell carcinoma status post resection with skin graft who presented to the ED after syncopal episode at home after he was bearing down to pass urine.  Apparently he had a Foley catheter for approximately 5 weeks and this was removed 2 days ago.  This was replaced while here as he had significant urinary retention.  This is thought to be related to his BPH for which she had been restarted on his finasteride as well.  He was actually admitted for sepsis secondary to UTI with urine culture still pending, but previously had Klebsiella and E. coli infections with good response to Keflex which she will be discharged with at this time.  He is completed a 2 day course of cefepime while here with improvement in leukocytosis and will complete another 5-day course of Keflex at home.  He also had a syncopal episode at home due to significant straining while he was trying to urinate.  It is thought that his syncopal episode is likely related to a vasovagal response from this as he has had recent echocardiogram with LVEF 55 to 60% and carotid ultrasound with significant stenosis of the right ICA.  He will require vascular  surgery follow-up in the outpatient setting for this as well.  He has been provided information for follow-up at the [facility] by care management.  He will leave here with indwelling Foley catheter until he is further seen in the outpatient setting.  He has ambulated prior to discharge with no lightheadedness, dizziness, or presyncopal symptoms.  Discharge Diagnoses:  Principal Problem:   Sepsis secondary to UTI [facility]) Active Problems:   Essential hypertension   Anemia   Hypokalemia   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   Carotid artery disease (HCC)   Syncope and collapse   Urinary retention with incomplete bladder emptying  1. Syncope with collapse likely multifactorial.  I believe this is acutely related to sepsis with his repeat urinary tract infection that is catheter associated as well as some vasovagal influence given that he was trying to bear down to pass urine.  He also appears to have uncontrolled blood pressure readings.  Patient has had recent echocardiogram on 07/2017 with LVEF of 55 to 60%.  He has also had recent ultrasound on 05/2017 with significant stenosis of the right ICA of 70 to 99% noted at that time.    Will need outpatient follow-up with vascular surgery as well as urology. 2. Sepsis secondary to UTI-resolved.  Urine culture still pending, but historically patient has had E. coli and Klebsiella which has been responsive to Keflex.  He will require 5 more days of this antibiotic and has completed 2 days of IV cefepime while here with good improvement. 3. Acute urinary retention.    Continue  with indwelling Foley catheter at home. Will need to be followed up outpatient with urology. 4. Mild hypokalemia-resolved. 5. Hypertension.    Continue home medications. 6. Anemia.  Chronic and appears to be stable with no acute bleeding.    Discharge Instructions  Discharge Instructions    Diet - low sodium heart healthy   Complete by:  As directed    Increase activity slowly    Complete by:  As directed      Allergies as of 09/02/2017   No Known Allergies     Medication List    TAKE these medications   amLODipine 5 MG tablet Commonly known as:  NORVASC Take 1 tablet (5 mg total) by mouth daily.   aspirin EC 81 MG tablet Take 81 mg by mouth daily.   atorvastatin 40 MG tablet Commonly known as:  LIPITOR Take 1 tablet (40 mg total) by mouth daily at 6 PM.   carvedilol 12.5 MG tablet Commonly known as:  COREG Take 1 tablet (12.5 mg total) by mouth 2 (two) times daily with a meal. What changed:  when to take this   cephALEXin 500 MG capsule Commonly known as:  KEFLEX Take 1 capsule (500 mg total) by mouth 4 (four) times daily for 5 days.   finasteride 5 MG tablet Commonly known as:  PROSCAR Take 1 tablet (5 mg total) by mouth daily. Start taking on:  09/03/2017   furosemide 40 MG tablet Commonly known as:  LASIX Take 40 mg by mouth daily.      Follow-up Information    [facility] to [facility]   Contact information: [phone]         No Known Allergies  Consultations:  None   Procedures/Studies: Dg Chest 2 View  Result Date: 09/01/2017 CLINICAL DATA:  Syncope. EXAM: CHEST - 2 VIEW COMPARISON:  Radiograph 07/04/2017.  PET CT 04/28/2017 FINDINGS: The cardiomediastinal contours are normal. The lungs are clear. Pulmonary vasculature is normal. No consolidation, pleural effusion, or pneumothorax. No acute osseous abnormalities are seen. IMPRESSION: No acute pulmonary process. Electronically Signed   By: Jeb Levering M.D.   On: 09/01/2017 05:53   Ct Head Wo Contrast  Result Date: 09/01/2017 CLINICAL DATA:  Syncopal episode with bowel movement. History of hypertension, carotid stenosis. EXAM: CT HEAD WITHOUT CONTRAST TECHNIQUE: Contiguous axial images were obtained from the base of the skull through the vertex without intravenous contrast. COMPARISON:  CT HEAD July 04, 2017 FINDINGS: BRAIN: No intraparenchymal hemorrhage,  mass effect nor midline shift. Mild parenchymal brain volume loss. No hydrocephalus. Confluent supratentorial white matter hypodensities. Old basal ganglia and RIGHT thalamus lacunar infarcts. No acute large vascular territory infarcts. No abnormal extra-axial fluid collections. Basal cisterns are patent. VASCULAR: Moderate calcific atherosclerosis of the carotid siphons. SKULL: No skull fracture. Old RIGHT anterior maxillary wall ORIF. No significant scalp soft tissue swelling. LEFT suboccipital tricholemmal cyst. SINUSES/ORBITS: The mastoid air-cells and included paranasal sinuses are well-aerated.The included ocular globes and orbital contents are non-suspicious. OTHER: None. IMPRESSION: 1. No acute intracranial process. 2. Severe chronic small vessel ischemic changes and old lacunar infarcts. 3. Mild parenchymal brain volume loss. Electronically Signed   By: Elon Alas M.D.   On: 09/01/2017 05:47   Discharge Exam: Vitals:   09/01/17 2146 09/02/17 0528  BP: (!) 148/56 (!) 160/63  Pulse: 70 67  Resp: 18 19  Temp: 98.1 F (36.7 C) 98.3 F (36.8 C)  SpO2: 99% 100%   Vitals:   09/01/17 2993  09/01/17 1416 09/01/17 2146 09/02/17 0528  BP: (!) 184/88 (!) 168/60 (!) 148/56 (!) 160/63  Pulse: 88 77 70 67  Resp: 18 20 18 19   Temp: 98.2 F (36.8 C) 98.7 F (37.1 C) 98.1 F (36.7 C) 98.3 F (36.8 C)  TempSrc: Oral Oral Oral Oral  SpO2: 99% 99% 99% 100%  Weight: 77.6 kg (171 lb 1.2 oz)     Height: 6' (1.829 m)       General: Pt is alert, awake, not in acute distress Cardiovascular: RRR, S1/S2 +, no rubs, no gallops Respiratory: CTA bilaterally, no wheezing, no rhonchi Abdominal: Soft, NT, ND, bowel sounds + Extremities: no edema, no cyanosis Foley with clear, yellow urine output.    The results of significant diagnostics from this hospitalization (including imaging, microbiology, ancillary and laboratory) are listed below for reference.     Microbiology: Recent Results (from  the past 240 hour(s))  Blood Culture (routine x 2)     Status: None (Preliminary result)   Collection Time: 09/01/17  5:47 AM  Result Value Ref Range Status   Specimen Description BLOOD RIGHT ARM  Final   Special Requests   Final    BOTTLES DRAWN AEROBIC AND ANAEROBIC Blood Culture adequate volume   Culture   Final    NO GROWTH < 24 HOURS Performed at [facility], [address], [place], [place] [address]    Report Status PENDING  Incomplete  Blood Culture (routine x 2)     Status: None (Preliminary result)   Collection Time: 09/01/17  5:48 AM  Result Value Ref Range Status   Specimen Description BLOOD LEFT ARM  Final   Special Requests   Final    BOTTLES DRAWN AEROBIC AND ANAEROBIC Blood Culture adequate volume   Culture   Final    NO GROWTH < 24 HOURS Performed at [facility], [address], [place], [place] [address]    Report Status PENDING  Incomplete     Labs: BNP (last 3 results) Recent Labs    04/02/17 0000 07/05/17 0424  BNP 812.0* 595.6*   Basic Metabolic Panel: Recent Labs  Lab 09/01/17 0312 09/02/17 0600  NA 136 142  K 3.4* 3.5  CL 102 111  CO2 24 26  GLUCOSE 121* 90  BUN 19 16  CREATININE 1.21 1.07  CALCIUM 9.0 8.6*  MG  --  2.1   Liver Function Tests: Recent Labs  Lab 09/01/17 0312  AST 27  ALT 26  ALKPHOS 127*  BILITOT 0.9  PROT 7.8  ALBUMIN 3.7   No results for input(s): LIPASE, AMYLASE in the last 168 hours. No results for input(s): AMMONIA in the last 168 hours. CBC: Recent Labs  Lab 09/01/17 0312 09/02/17 0600  WBC 14.2* 8.3  NEUTROABS 10.9*  --   HGB 12.0* 10.0*  HCT 37.2* 32.5*  MCV 84.4 86.4  PLT 358 283   Cardiac Enzymes: No results for input(s): CKTOTAL, CKMB, CKMBINDEX, TROPONINI in the last 168 hours. BNP: Invalid input(s): POCBNP CBG: No results for input(s): GLUCAP in the last 168 hours. D-Dimer No results for input(s): DDIMER in the last 72 hours. Hgb A1c No results for input(s): HGBA1C in the last 72  hours. Lipid Profile No results for input(s): CHOL, HDL, LDLCALC, TRIG, CHOLHDL, LDLDIRECT in the last 72 hours. Thyroid function studies No results for input(s): TSH, T4TOTAL, T3FREE, THYROIDAB in the last 72 hours.  Invalid input(s): FREET3 Anemia work up No results for input(s): VITAMINB12, FOLATE, FERRITIN, TIBC, IRON,  RETICCTPCT in the last 72 hours. Urinalysis    Component Value Date/Time   COLORURINE YELLOW 09/01/2017 0345   APPEARANCEUR HAZY (A) 09/01/2017 0345   APPEARANCEUR Cloudy 04/23/2011 1044   LABSPEC 1.006 09/01/2017 0345   LABSPEC 1.018 04/23/2011 1044   PHURINE 7.0 09/01/2017 0345   GLUCOSEU NEGATIVE 09/01/2017 0345   GLUCOSEU Negative 04/23/2011 1044   HGBUR MODERATE (A) 09/01/2017 0345   BILIRUBINUR NEGATIVE 09/01/2017 0345   BILIRUBINUR Negative 04/23/2011 1044   KETONESUR NEGATIVE 09/01/2017 0345   PROTEINUR 30 (A) 09/01/2017 0345   NITRITE NEGATIVE 09/01/2017 0345   LEUKOCYTESUR LARGE (A) 09/01/2017 0345   LEUKOCYTESUR Trace 04/23/2011 1044   Sepsis Labs Invalid input(s): PROCALCITONIN,  WBC,  LACTICIDVEN Microbiology Recent Results (from the past 240 hour(s))  Blood Culture (routine x 2)     Status: None (Preliminary result)   Collection Time: 09/01/17  5:47 AM  Result Value Ref Range Status   Specimen Description BLOOD RIGHT ARM  Final   Special Requests   Final    BOTTLES DRAWN AEROBIC AND ANAEROBIC Blood Culture adequate volume   Culture   Final    NO GROWTH < 24 HOURS Performed at [facility], [address] [place], [place] [address]    Report Status PENDING  Incomplete  Blood Culture (routine x 2)     Status: None (Preliminary result)   Collection Time: 09/01/17  5:48 AM  Result Value Ref Range Status   Specimen Description BLOOD LEFT ARM  Final   Special Requests   Final    BOTTLES DRAWN AEROBIC AND ANAEROBIC Blood Culture adequate volume   Culture   Final    NO GROWTH < 24 HOURS Performed at [facility] [address],  [place],  [address]    Report Status PENDING  Incomplete     Time coordinating discharge: 40 minutes  SIGNED:   Rodena Goldmann, DO [facility] 09/02/2017, 11:47 AM Pager [phone]  If 7PM-7AM, please contact night-coverage [email or web address] Password [email or web address]

## 2017-09-02 NOTE — Progress Notes (Signed)
Patient ambulated in the hallway without difficulty.No c/o pain or discomfort noted.

## 2017-09-06 LAB — CULTURE, BLOOD (ROUTINE X 2)
CULTURE: NO GROWTH
Culture: NO GROWTH
Special Requests: ADEQUATE
Special Requests: ADEQUATE

## 2017-09-08 ENCOUNTER — Other Ambulatory Visit: Payer: Self-pay

## 2017-09-08 ENCOUNTER — Ambulatory Visit: Payer: Self-pay | Admitting: Nurse Practitioner

## 2017-09-08 ENCOUNTER — Emergency Department (HOSPITAL_COMMUNITY)
Admission: EM | Admit: 2017-09-08 | Discharge: 2017-09-08 | Disposition: A | Discharge status: Home / Self Care | Payer: Medicaid Other | Attending: Emergency Medicine | Admitting: Emergency Medicine

## 2017-09-08 ENCOUNTER — Encounter (HOSPITAL_COMMUNITY): Payer: Self-pay | Admitting: Emergency Medicine

## 2017-09-08 DIAGNOSIS — I509 Heart failure, unspecified: Secondary | ICD-10-CM | POA: Diagnosis not present

## 2017-09-08 DIAGNOSIS — T839XXA Unspecified complication of genitourinary prosthetic device, implant and graft, initial encounter: Secondary | ICD-10-CM

## 2017-09-08 DIAGNOSIS — J449 Chronic obstructive pulmonary disease, unspecified: Secondary | ICD-10-CM | POA: Diagnosis not present

## 2017-09-08 DIAGNOSIS — N189 Chronic kidney disease, unspecified: Secondary | ICD-10-CM | POA: Insufficient documentation

## 2017-09-08 DIAGNOSIS — Z79899 Other long term (current) drug therapy: Secondary | ICD-10-CM | POA: Diagnosis not present

## 2017-09-08 DIAGNOSIS — Z436 Encounter for attention to other artificial openings of urinary tract: Secondary | ICD-10-CM | POA: Diagnosis not present

## 2017-09-08 DIAGNOSIS — F17210 Nicotine dependence, cigarettes, uncomplicated: Secondary | ICD-10-CM | POA: Diagnosis not present

## 2017-09-08 DIAGNOSIS — Z7982 Long term (current) use of aspirin: Secondary | ICD-10-CM | POA: Diagnosis not present

## 2017-09-08 DIAGNOSIS — I130 Hypertensive heart and chronic kidney disease with heart failure and stage 1 through stage 4 chronic kidney disease, or unspecified chronic kidney disease: Secondary | ICD-10-CM | POA: Insufficient documentation

## 2017-09-08 NOTE — ED Triage Notes (Signed)
Patient complaining of leaking from foley catheter starting this morning. Denies pain.

## 2017-09-08 NOTE — Discharge Instructions (Signed)
Call [facility] to arrange a follow-up appt.

## 2017-09-09 NOTE — ED Provider Notes (Signed)
[facility] EMERGENCY DEPARTMENT Provider Note   CSN: [id number] Arrival date & time: 09/08/17  1033     History   Chief Complaint Chief Complaint  Patient presents with  . Foley Catheter Problem    HPI Michael [name] is a 62 y.o. male.  HPI   Michael [name] is a 62 y.o. male who presents to the Emergency Department requesting evaluation for urine leaking from his Foley catheter.  He has a history of urinary retention, seen here 1 week ago and a Foley catheter was placed.  Is followed up by urology.  He had a Foley catheter placed 1 week ago.  States that he woke up on the morning of arrival and noticed urine leaking around his penis.  He denies pain or decreased urine in the Foley bag.  No abdominal tenderness.  No fever no chills.   Past Medical History:  Diagnosis Date  . Acute urinary retention    followed by OP urology   . Carotid stenosis   . CHF (congestive heart failure) (HCC)    LVEF 50-55% 03/2017  . CKD (chronic kidney disease)    Stage II  . Head and neck cancer ([place]) 2019   s/p right facial basal cell carcinoma s/p resection with right partial mastectomy and partal rhinectomy with skin graft (06/13/17)  . HTN (hypertension)   . Iron deficiency anemia   . Tobacco abuse     Patient Active Problem List   Diagnosis Date Noted  . Sepsis secondary to UTI ([place]) 09/01/2017  . Syncope and collapse 07/05/2017  . COPD exacerbation ([place]) 07/05/2017  . Acute renal failure superimposed on chronic kidney disease ([place]) 07/05/2017  . Catheter-associated urinary tract infection ([place]) 07/05/2017  . Leukocytosis 07/05/2017  . Urinary retention with incomplete bladder emptying 07/05/2017  . UTI (urinary tract infection) 07/05/2017  . Diarrhea 07/05/2017  . Carotid artery disease ([place]) 06/25/2017  . AKI (acute kidney injury) ([place])   . Orthostatic syncope   . Syncope due to orthostatic hypotension 06/24/2017  . Heart murmur 06/24/2017  . Abnormal EKG 06/24/2017  .  Abnormal PET scan of colon 05/25/2017  . 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 Periapical abscess 04/04/2017  . Iron (Fe) deficiency anemia 04/04/2017  . Acute diastolic CHF (congestive heart failure) ([place]) 04/04/2017  . Essential hypertension 04/02/2017  . Anemia 04/02/2017  . Acute CHF (congestive heart failure) ([place]) 04/02/2017  . Acute hypoxemic respiratory failure ([place]) 04/02/2017  . Lactic acidosis 04/02/2017  . Tobacco abuse 04/02/2017  . CHF (congestive heart failure) ([place]) 04/02/2017  . Elevated troponin 04/02/2017  . Thrombocytosis ([place]) 04/02/2017  . Hypokalemia 04/02/2017  . Skin lesion of face   . Nasal lesion     Past Surgical History:  Procedure Laterality Date  . ANKLE CLOSED REDUCTION Right   . COLONOSCOPY N/A 05/31/2017   Procedure: COLONOSCOPY;  Surgeon: Daneil Dolin, MD;  [facility];  Service: Endoscopy;  Laterality: N/A;  2:45pm  . POLYPECTOMY  05/31/2017   Procedure: POLYPECTOMY;  Surgeon: Daneil Dolin, MD;  Location: [facility];  Service: Endoscopy;;  . TONSILLECTOMY          Home Medications    Prior to Admission medications   Medication Sig Start Date End Date Taking? Authorizing Provider  amLODipine (NORVASC) 5 MG tablet Take 1 tablet (5 mg total) by mouth daily. 07/07/17   Rai, Vernelle Emerald, MD  aspirin EC 81 MG tablet Take 81 mg by mouth  daily.    [provider]  atorvastatin (LIPITOR) 40 MG tablet Take 1 tablet (40 mg total) by mouth daily at 6 PM. 05/23/17   Derek Jack, MD  carvedilol (COREG) 12.5 MG tablet Take 1 tablet (12.5 mg total) by mouth 2 (two) times daily with a meal. Patient taking differently: Take 12.5 mg by mouth daily.  05/23/17   Derek Jack, MD  finasteride (PROSCAR) 5 MG tablet Take 1 tablet (5 mg total) by mouth daily. 09/03/17   Manuella Ghazi, Pratik D, DO  furosemide (LASIX) 40 MG tablet Take 40 mg by mouth daily.    [provider]    Family History Family  History  Problem Relation Age of Onset  . Stroke Father   . Cirrhosis Mother   . Colon cancer Neg Hx     Social History Social History   Tobacco Use  . Smoking status: Current Some Day Smoker    Packs/day: 0.10    Years: 40.00    Pack years: 4.00    Types: Cigarettes  . Smokeless tobacco: Never Used  Substance Use Topics  . Alcohol use: Not Currently    Frequency: Never  . Drug use: Never     Allergies   Patient has no known allergies.   Review of Systems Review of Systems  Constitutional: Negative for appetite change, chills and fever.  Respiratory: Negative for chest tightness and shortness of breath.   Cardiovascular: Negative for chest pain.  Gastrointestinal: Negative for abdominal pain, nausea and vomiting.  Genitourinary: Negative for decreased urine volume, discharge, dysuria, scrotal swelling and testicular pain.  Musculoskeletal: Negative for back pain.  Skin: Negative for rash.  Neurological: Negative for weakness and numbness.     Physical Exam Updated Vital Signs BP (!) 141/85 (BP Location: Right Arm)   Pulse 68   Temp 97.8 F (36.6 C) (Oral)   Resp 12   Ht 6' (1.829 m)   Wt 72.6 kg   SpO2 100%   BMI 21.70 kg/m   Physical Exam  Constitutional: He appears well-developed. No distress.  Cardiovascular: Normal rate and regular rhythm.  Pulmonary/Chest: Effort normal and breath sounds normal. No respiratory distress.  Abdominal: Soft. He exhibits no distension. There is no tenderness.  Genitourinary:  Genitourinary Comments: Foley catheter in place.  Urine in the Foley catheter bag appears clear.  No blood or clots.  Leaking around insertion site.  Musculoskeletal: Normal range of motion.  Neurological: He is alert. No sensory deficit.  Skin: Skin is warm. Capillary refill takes less than 2 seconds.  Nursing note and vitals reviewed.    ED Treatments / Results  Labs (all labs ordered are listed, but only abnormal results are  displayed) Labs Reviewed - No data to display  EKG None  Radiology No results found.  Procedures Procedures (including critical care time)  Medications Ordered in ED Medications - No data to display   Initial Impression / Assessment and Plan / ED Course  I have reviewed the triage vital signs and the nursing notes.  Pertinent labs & imaging results that were available during my care of the patient were reviewed by me and considered in my medical decision making (see chart for details).     Patient here requesting change of Foley catheter.  Asymptomatic.  Catheter appears to be leaking at insertion site.  68 French Foley catheter changed by nursing staff without difficulty .  Appears to be draining normally.  [facility] agrees to [facility] with [facility].  Final Clinical Impressions(s) / ED Diagnoses   Final diagnoses:  Foley catheter problem, initial encounter [facility])    ED Discharge Orders    None       Kem Parkinson, PA-C 09/09/17 1731    Nat Christen, MD 09/10/17 1620

## 2017-09-17 ENCOUNTER — Other Ambulatory Visit: Payer: Self-pay

## 2017-09-17 ENCOUNTER — Inpatient Hospital Stay (HOSPITAL_COMMUNITY)
Admission: EM | Admit: 2017-09-17 | Discharge: 2017-09-19 | DRG: 698 | Disposition: A | Discharge status: Home / Self Care | Payer: Medicaid Other | Attending: Internal Medicine | Admitting: Internal Medicine

## 2017-09-17 ENCOUNTER — Encounter (HOSPITAL_COMMUNITY): Payer: Self-pay | Admitting: Emergency Medicine

## 2017-09-17 DIAGNOSIS — I6521 Occlusion and stenosis of right carotid artery: Secondary | ICD-10-CM | POA: Diagnosis present

## 2017-09-17 DIAGNOSIS — R339 Retention of urine, unspecified: Secondary | ICD-10-CM

## 2017-09-17 DIAGNOSIS — I5042 Chronic combined systolic (congestive) and diastolic (congestive) heart failure: Secondary | ICD-10-CM | POA: Diagnosis not present

## 2017-09-17 DIAGNOSIS — R748 Abnormal levels of other serum enzymes: Secondary | ICD-10-CM | POA: Diagnosis not present

## 2017-09-17 DIAGNOSIS — N3289 Other specified disorders of bladder: Secondary | ICD-10-CM | POA: Diagnosis present

## 2017-09-17 DIAGNOSIS — I739 Peripheral vascular disease, unspecified: Secondary | ICD-10-CM

## 2017-09-17 DIAGNOSIS — Z79899 Other long term (current) drug therapy: Secondary | ICD-10-CM

## 2017-09-17 DIAGNOSIS — I5032 Chronic diastolic (congestive) heart failure: Secondary | ICD-10-CM | POA: Diagnosis present

## 2017-09-17 DIAGNOSIS — N138 Other obstructive and reflux uropathy: Secondary | ICD-10-CM | POA: Diagnosis present

## 2017-09-17 DIAGNOSIS — D72829 Elevated white blood cell count, unspecified: Secondary | ICD-10-CM | POA: Diagnosis not present

## 2017-09-17 DIAGNOSIS — F17200 Nicotine dependence, unspecified, uncomplicated: Secondary | ICD-10-CM | POA: Diagnosis present

## 2017-09-17 DIAGNOSIS — I130 Hypertensive heart and chronic kidney disease with heart failure and stage 1 through stage 4 chronic kidney disease, or unspecified chronic kidney disease: Secondary | ICD-10-CM | POA: Diagnosis present

## 2017-09-17 DIAGNOSIS — T83511A Infection and inflammatory reaction due to indwelling urethral catheter, initial encounter: Secondary | ICD-10-CM

## 2017-09-17 DIAGNOSIS — T83511D Infection and inflammatory reaction due to indwelling urethral catheter, subsequent encounter: Secondary | ICD-10-CM | POA: Diagnosis not present

## 2017-09-17 DIAGNOSIS — Z720 Tobacco use: Secondary | ICD-10-CM | POA: Diagnosis present

## 2017-09-17 DIAGNOSIS — C44311 Basal cell carcinoma of skin of nose: Secondary | ICD-10-CM | POA: Diagnosis not present

## 2017-09-17 DIAGNOSIS — E872 Acidosis: Secondary | ICD-10-CM | POA: Diagnosis present

## 2017-09-17 DIAGNOSIS — I248 Other forms of acute ischemic heart disease: Secondary | ICD-10-CM | POA: Diagnosis present

## 2017-09-17 DIAGNOSIS — I10 Essential (primary) hypertension: Secondary | ICD-10-CM | POA: Diagnosis present

## 2017-09-17 DIAGNOSIS — A419 Sepsis, unspecified organism: Secondary | ICD-10-CM

## 2017-09-17 DIAGNOSIS — N401 Enlarged prostate with lower urinary tract symptoms: Secondary | ICD-10-CM | POA: Diagnosis present

## 2017-09-17 DIAGNOSIS — R55 Syncope and collapse: Secondary | ICD-10-CM

## 2017-09-17 DIAGNOSIS — E869 Volume depletion, unspecified: Secondary | ICD-10-CM | POA: Diagnosis present

## 2017-09-17 DIAGNOSIS — R7989 Other specified abnormal findings of blood chemistry: Secondary | ICD-10-CM | POA: Diagnosis present

## 2017-09-17 DIAGNOSIS — Z85828 Personal history of other malignant neoplasm of skin: Secondary | ICD-10-CM

## 2017-09-17 DIAGNOSIS — Z9889 Other specified postprocedural states: Secondary | ICD-10-CM | POA: Diagnosis not present

## 2017-09-17 DIAGNOSIS — N179 Acute kidney failure, unspecified: Secondary | ICD-10-CM | POA: Diagnosis present

## 2017-09-17 DIAGNOSIS — T83510D Infection and inflammatory reaction due to cystostomy catheter, subsequent encounter: Secondary | ICD-10-CM | POA: Diagnosis not present

## 2017-09-17 DIAGNOSIS — E876 Hypokalemia: Secondary | ICD-10-CM | POA: Diagnosis present

## 2017-09-17 DIAGNOSIS — D509 Iron deficiency anemia, unspecified: Secondary | ICD-10-CM | POA: Diagnosis present

## 2017-09-17 DIAGNOSIS — Z7982 Long term (current) use of aspirin: Secondary | ICD-10-CM | POA: Diagnosis not present

## 2017-09-17 DIAGNOSIS — I959 Hypotension, unspecified: Secondary | ICD-10-CM | POA: Diagnosis present

## 2017-09-17 DIAGNOSIS — Y846 Urinary catheterization as the cause of abnormal reaction of the patient, or of later complication, without mention of misadventure at the time of the procedure: Secondary | ICD-10-CM | POA: Diagnosis present

## 2017-09-17 DIAGNOSIS — R338 Other retention of urine: Secondary | ICD-10-CM | POA: Diagnosis present

## 2017-09-17 DIAGNOSIS — N390 Urinary tract infection, site not specified: Secondary | ICD-10-CM | POA: Diagnosis present

## 2017-09-17 DIAGNOSIS — T83518A Infection and inflammatory reaction due to other urinary catheter, initial encounter: Principal | ICD-10-CM | POA: Diagnosis present

## 2017-09-17 DIAGNOSIS — F17210 Nicotine dependence, cigarettes, uncomplicated: Secondary | ICD-10-CM | POA: Diagnosis present

## 2017-09-17 DIAGNOSIS — R778 Other specified abnormalities of plasma proteins: Secondary | ICD-10-CM | POA: Diagnosis present

## 2017-09-17 DIAGNOSIS — I779 Disorder of arteries and arterioles, unspecified: Secondary | ICD-10-CM | POA: Diagnosis present

## 2017-09-17 DIAGNOSIS — D631 Anemia in chronic kidney disease: Secondary | ICD-10-CM | POA: Diagnosis present

## 2017-09-17 DIAGNOSIS — I5022 Chronic systolic (congestive) heart failure: Secondary | ICD-10-CM | POA: Diagnosis present

## 2017-09-17 DIAGNOSIS — D649 Anemia, unspecified: Secondary | ICD-10-CM | POA: Diagnosis present

## 2017-09-17 DIAGNOSIS — C44111 Basal cell carcinoma of skin of unspecified eyelid, including canthus: Secondary | ICD-10-CM | POA: Diagnosis present

## 2017-09-17 DIAGNOSIS — N182 Chronic kidney disease, stage 2 (mild): Secondary | ICD-10-CM | POA: Diagnosis present

## 2017-09-17 DIAGNOSIS — Z823 Family history of stroke: Secondary | ICD-10-CM

## 2017-09-17 DIAGNOSIS — D638 Anemia in other chronic diseases classified elsewhere: Secondary | ICD-10-CM | POA: Diagnosis present

## 2017-09-17 LAB — URINALYSIS, ROUTINE W REFLEX MICROSCOPIC
Bilirubin Urine: NEGATIVE
GLUCOSE, UA: NEGATIVE mg/dL
Ketones, ur: NEGATIVE mg/dL
Nitrite: NEGATIVE
Protein, ur: 30 mg/dL — AB
SPECIFIC GRAVITY, URINE: 1.006 (ref 1.005–1.030)
WBC, UA: >50 WBC/hpf — ABNORMAL HIGH (ref 0–5)
pH: 7 (ref 5.0–8.0)

## 2017-09-17 LAB — TROPONIN I: TROPONIN I: 0.04 ng/mL — AB (ref <0.03)

## 2017-09-17 LAB — BASIC METABOLIC PANEL
Anion gap: 13 (ref 5–15)
BUN: 25 mg/dL — ABNORMAL HIGH (ref 8–23)
CALCIUM: 9.5 mg/dL (ref 8.9–10.3)
CO2: 24 mmol/L (ref 22–32)
Chloride: 104 mmol/L (ref 98–111)
Creatinine, Ser: 1.43 mg/dL — ABNORMAL HIGH (ref 0.61–1.24)
GFR calc non Af Amer: 51 mL/min — ABNORMAL LOW (ref >60)
GFR, EST AFRICAN AMERICAN: 59 mL/min — AB (ref >60)
GLUCOSE: 114 mg/dL — AB (ref 70–99)
Potassium: 3.8 mmol/L (ref 3.5–5.1)
Sodium: 141 mmol/L (ref 135–145)

## 2017-09-17 LAB — CBC
HCT: 37.3 % — ABNORMAL LOW (ref 39.0–52.0)
Hemoglobin: 11.9 g/dL — ABNORMAL LOW (ref 13.0–17.0)
MCH: 26.6 pg (ref 26.0–34.0)
MCHC: 31.9 g/dL (ref 30.0–36.0)
MCV: 83.3 fL (ref 78.0–100.0)
PLATELETS: 372 10*3/uL (ref 150–400)
RBC: 4.48 MIL/uL (ref 4.22–5.81)
RDW: 14.9 % (ref 11.5–15.5)
WBC: 16.8 10*3/uL — ABNORMAL HIGH (ref 4.0–10.5)

## 2017-09-17 LAB — CBG MONITORING, ED: Glucose-Capillary: 118 mg/dL — ABNORMAL HIGH (ref 70–99)

## 2017-09-17 LAB — LACTIC ACID, PLASMA
LACTIC ACID, VENOUS: 1.4 mmol/L (ref 0.5–1.9)
Lactic Acid, Venous: 0.7 mmol/L (ref 0.5–1.9)

## 2017-09-17 MED ORDER — TRAZODONE HCL 50 MG PO TABS: 25.0000 mg | ORAL_TABLET | Freq: Every evening | ORAL | Status: DC | PRN

## 2017-09-17 MED ORDER — ENOXAPARIN SODIUM 40 MG/0.4ML ~~LOC~~ SOLN
40.0000 mg | SUBCUTANEOUS | Status: DC
  Administered 2017-09-17: 40 mg via SUBCUTANEOUS
  Filled 2017-09-17 (×2): qty 0.4

## 2017-09-17 MED ORDER — ASPIRIN EC 81 MG PO TBEC
81.0000 mg | DELAYED_RELEASE_TABLET | Freq: Every day | ORAL | Status: DC
  Administered 2017-09-17 – 2017-09-19 (×3): 81 mg via ORAL
  Filled 2017-09-17 (×3): qty 1

## 2017-09-17 MED ORDER — ATORVASTATIN CALCIUM 40 MG PO TABS
40.0000 mg | ORAL_TABLET | Freq: Every day | ORAL | Status: DC
  Administered 2017-09-17 – 2017-09-18 (×2): 40 mg via ORAL
  Filled 2017-09-17 (×2): qty 1

## 2017-09-17 MED ORDER — ONDANSETRON HCL 4 MG PO TABS: 4.0000 mg | ORAL_TABLET | Freq: Four times a day (QID) | ORAL | Status: DC | PRN

## 2017-09-17 MED ORDER — SODIUM CHLORIDE 0.9 % IV BOLUS
1000.0000 mL | Freq: Once | INTRAVENOUS | Status: AC
  Administered 2017-09-17: 1000 mL via INTRAVENOUS

## 2017-09-17 MED ORDER — SODIUM CHLORIDE 0.9 % IV SOLN
INTRAVENOUS | Status: DC
  Administered 2017-09-17 – 2017-09-19 (×2): via INTRAVENOUS

## 2017-09-17 MED ORDER — SODIUM CHLORIDE 0.9 % IV SOLN
1.0000 g | Freq: Two times a day (BID) | INTRAVENOUS | Status: DC
  Administered 2017-09-17 – 2017-09-19 (×4): 1 g via INTRAVENOUS
  Filled 2017-09-17 (×6): qty 1

## 2017-09-17 MED ORDER — FINASTERIDE 5 MG PO TABS
5.0000 mg | ORAL_TABLET | Freq: Every day | ORAL | Status: DC
  Administered 2017-09-17 – 2017-09-19 (×3): 5 mg via ORAL
  Filled 2017-09-17 (×4): qty 1

## 2017-09-17 MED ORDER — ENOXAPARIN SODIUM 30 MG/0.3ML ~~LOC~~ SOLN: 30.0000 mg | SUBCUTANEOUS | Status: DC

## 2017-09-17 MED ORDER — AMLODIPINE BESYLATE 5 MG PO TABS
5.0000 mg | ORAL_TABLET | Freq: Every day | ORAL | Status: DC
  Administered 2017-09-17 – 2017-09-18 (×2): 5 mg via ORAL
  Filled 2017-09-17 (×3): qty 1

## 2017-09-17 MED ORDER — ACETAMINOPHEN 325 MG PO TABS
650.0000 mg | ORAL_TABLET | Freq: Once | ORAL | Status: AC
  Administered 2017-09-17: 650 mg via ORAL
  Filled 2017-09-17: qty 2

## 2017-09-17 MED ORDER — IPRATROPIUM-ALBUTEROL 0.5-2.5 (3) MG/3ML IN SOLN: 3.0000 mL | RESPIRATORY_TRACT | Status: DC | PRN

## 2017-09-17 MED ORDER — CARVEDILOL 12.5 MG PO TABS
12.5000 mg | ORAL_TABLET | Freq: Two times a day (BID) | ORAL | Status: DC
  Administered 2017-09-17 – 2017-09-18 (×4): 12.5 mg via ORAL
  Filled 2017-09-17 (×5): qty 1

## 2017-09-17 MED ORDER — NICOTINE 21 MG/24HR TD PT24
21.0000 mg | MEDICATED_PATCH | Freq: Every day | TRANSDERMAL | Status: DC
  Administered 2017-09-17 – 2017-09-19 (×3): 21 mg via TRANSDERMAL
  Filled 2017-09-17 (×3): qty 1

## 2017-09-17 MED ORDER — SODIUM CHLORIDE 0.9 % IV SOLN
2.0000 g | Freq: Once | INTRAVENOUS | Status: AC
  Administered 2017-09-17: 2 g via INTRAVENOUS
  Filled 2017-09-17: qty 2

## 2017-09-17 MED ORDER — ACETAMINOPHEN 325 MG PO TABS: 650.0000 mg | ORAL_TABLET | Freq: Four times a day (QID) | ORAL | Status: DC | PRN

## 2017-09-17 MED ORDER — ACETAMINOPHEN 650 MG RE SUPP: 650.0000 mg | Freq: Four times a day (QID) | RECTAL | Status: DC | PRN

## 2017-09-17 MED ORDER — MORPHINE SULFATE (PF) 4 MG/ML IV SOLN: 4.0000 mg | Freq: Once | INTRAVENOUS | Status: DC

## 2017-09-17 MED ORDER — ONDANSETRON HCL 4 MG/2ML IJ SOLN: 4.0000 mg | Freq: Four times a day (QID) | INTRAMUSCULAR | Status: DC | PRN

## 2017-09-17 NOTE — Progress Notes (Signed)
Pharmacy Antibiotic Note  Michael [name] is a 62 y.o. male admitted on 09/17/2017 with sepsis.  Pharmacy has been consulted for cefepime dosing.  Plan: Start cefepime 1g IV q12h. Pharmacy will continue to monitor patient's progress, labs and cultures.  Height: 6' (182.9 cm) Weight: 177 lb 7.5 oz (80.5 kg) IBW/kg (Calculated) : 77.6  Temp (24hrs), Avg:99.1 F (37.3 C), Min:98 F (36.7 C), Max:100.5 F (38.1 C)  Recent Labs  Lab 09/17/17 0543 09/17/17 0624 09/17/17 0829  WBC 16.8*  --   --   CREATININE 1.43*  --   --   LATICACIDVEN  --  1.4 0.7    Estimated Creatinine Clearance: 58 mL/min (A) (by C-G formula based on SCr of 1.43 mg/dL (H)).    No Known Allergies  Antimicrobials this admission: 8/18 cefepime>>   Microbiology results: 8/18 BCx2:   8/18 UCx:     Thank you for allowing pharmacy to be a part of this patient's care.  Despina Pole, Pharm. D. Clinical Pharmacist 09/17/2017 10:46 AM

## 2017-09-17 NOTE — ED Notes (Signed)
Date and time results received: 09/17/17 8:07 AM   (use smartphrase ".now" to insert current time)  Test: Troponin  Critical Value: 0.04  Name of Provider Notified:Dr. Rancour

## 2017-09-17 NOTE — ED Provider Notes (Signed)
[facility] EMERGENCY DEPARTMENT Provider Note   CSN: [id number] Arrival date & time: 09/17/17  0272     History   Chief Complaint Chief Complaint  Patient presents with  . Loss of Consciousness    HPI Michael [name] is a 63 y.o. male.  Patient presents via EMS after syncopal episode.  States he was on the commode attempting to have a bowel movement when he passed out while straining and fell forward.  Denies hitting his head or any other injury.  Patient with similar presentation several weeks ago.  He has an indwelling Foley catheter in place and states it has not been draining since last evening.  He is hypertensive and tachycardic and uncomfortable.  He denies any head, neck, back, chest or abdominal pain.  He complains of full bladder sensation not been able to urinate.  Denies any fevers.  States he completed the course of antibiotics that was prescribed at the beginning of August for a urinary tract infection.  Denies any dizziness or lightheadedness.  The history is provided by the EMS personnel and the patient.  Loss of Consciousness   Pertinent negatives include abdominal pain, chest pain, congestion, dizziness, fever, headaches, nausea, vomiting and weakness.    Past Medical History:  Diagnosis Date  . Acute urinary retention    followed by OP urology   . Carotid stenosis   . CHF (congestive heart failure) (HCC)    LVEF 50-55% 03/2017  . CKD (chronic kidney disease)    Stage II  . Head and neck cancer ([place]) 2019   s/p right facial basal cell carcinoma s/p resection with right partial mastectomy and partal rhinectomy with skin graft (06/13/17)  . HTN (hypertension)   . Iron deficiency anemia   . Tobacco abuse     Patient Active Problem List   Diagnosis Date Noted  . Sepsis secondary to UTI ([place]) 09/01/2017  . Syncope and collapse 07/05/2017  . COPD exacerbation ([place]) 07/05/2017  . Acute renal failure superimposed on chronic kidney disease ([place]) 07/05/2017  .  Catheter-associated urinary tract infection ([place]) 07/05/2017  . Leukocytosis 07/05/2017  . Urinary retention with incomplete bladder emptying 07/05/2017  . UTI (urinary tract infection) 07/05/2017  . Diarrhea 07/05/2017  . Carotid artery disease ([place]) 06/25/2017  . AKI (acute kidney injury) ([place])   . Orthostatic syncope   . Syncope due to orthostatic hypotension 06/24/2017  . Heart murmur 06/24/2017  . Abnormal EKG 06/24/2017  . Abnormal PET scan of colon 05/25/2017  . 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 Periapical abscess 04/04/2017  . Iron (Fe) deficiency anemia 04/04/2017  . Acute diastolic CHF (congestive heart failure) ([place]) 04/04/2017  . Essential hypertension 04/02/2017  . Anemia 04/02/2017  . Acute CHF (congestive heart failure) ([place]) 04/02/2017  . Acute hypoxemic respiratory failure ([place]) 04/02/2017  . Lactic acidosis 04/02/2017  . Tobacco abuse 04/02/2017  . CHF (congestive heart failure) ([place]) 04/02/2017  . Elevated troponin 04/02/2017  . Thrombocytosis ([place]) 04/02/2017  . Hypokalemia 04/02/2017  . Skin lesion of face   . Nasal lesion     Past Surgical History:  Procedure Laterality Date  . ANKLE CLOSED REDUCTION Right   . COLONOSCOPY N/A 05/31/2017   Procedure: COLONOSCOPY;  Surgeon: Daneil Dolin, MD;  [facility];  Service: Endoscopy;  Laterality: N/A;  2:45pm  . POLYPECTOMY  05/31/2017   Procedure: POLYPECTOMY;  Surgeon: Daneil Dolin, MD;  [facility];  Service: Endoscopy;;  .  TONSILLECTOMY          Home Medications    Prior to Admission medications   Medication Sig Start Date End Date Taking? Authorizing Provider  amLODipine (NORVASC) 5 MG tablet Take 1 tablet (5 mg total) by mouth daily. 07/07/17  Yes Rai, Ripudeep K, MD  aspirin EC 81 MG tablet Take 81 mg by mouth daily.   Yes [provider]  atorvastatin (LIPITOR) 40 MG tablet Take 1 tablet (40 mg total) by mouth daily at 6 PM. 05/23/17  Yes  Derek Jack, MD  carvedilol (COREG) 12.5 MG tablet Take 1 tablet (12.5 mg total) by mouth 2 (two) times daily with a meal. Patient taking differently: Take 12.5 mg by mouth daily.  05/23/17  Yes Derek Jack, MD  finasteride (PROSCAR) 5 MG tablet Take 1 tablet (5 mg total) by mouth daily. 09/03/17  Yes Shah, Pratik D, DO  furosemide (LASIX) 40 MG tablet Take 40 mg by mouth daily.   Yes [provider]    Family History Family History  Problem Relation Age of Onset  . Stroke Father   . Cirrhosis Mother   . Colon cancer Neg Hx     Social History Social History   Tobacco Use  . Smoking status: Current Some Day Smoker    Packs/day: 0.10    Years: 40.00    Pack years: 4.00    Types: Cigarettes  . Smokeless tobacco: Never Used  Substance Use Topics  . Alcohol use: Not Currently    Frequency: Never  . Drug use: Never     Allergies   Patient has no known allergies.   Review of Systems Review of Systems  Constitutional: Negative for activity change, appetite change and fever.  HENT: Negative for congestion and rhinorrhea.   Respiratory: Negative for cough, chest tightness and shortness of breath.   Cardiovascular: Positive for syncope. Negative for chest pain.  Gastrointestinal: Negative for abdominal pain, nausea and vomiting.  Genitourinary: Positive for decreased urine volume and difficulty urinating. Negative for flank pain, hematuria, scrotal swelling and testicular pain.  Musculoskeletal: Negative for arthralgias and myalgias.  Skin: Negative for rash.  Neurological: Positive for syncope. Negative for dizziness, weakness and headaches.    all other systems are negative except as noted in the HPI and PMH.    Physical Exam Updated Vital Signs BP (!) 247/152 (BP Location: Right Arm)   Pulse (!) 107   Temp 99 F (37.2 C) (Oral)   Resp 20   Ht 6' (1.829 m)   Wt 74.8 kg   SpO2 98%   BMI 22.38 kg/m   Physical Exam  Constitutional: He is  oriented to person, place, and time. He appears well-developed and well-nourished. No distress.  HENT:  Head: Normocephalic and atraumatic.  Mouth/Throat: Oropharynx is clear and moist. No oropharyngeal exudate.  Skin graft to right face  Eyes: Pupils are equal, round, and reactive to light. Conjunctivae and EOM are normal.  Neck: Normal range of motion. Neck supple.  No meningismus.  Cardiovascular: Normal rate, regular rhythm, normal heart sounds and intact distal pulses.  No murmur heard. Pulmonary/Chest: Effort normal and breath sounds normal. No respiratory distress.  Abdominal: Soft. There is no tenderness. There is no rebound and no guarding.  Palpable bladder  Genitourinary:  Genitourinary Comments: Foley catheter in place, minimal urine in the bag.  Musculoskeletal: Normal range of motion. He exhibits no edema or tenderness.  Neurological: He is alert and oriented to person, place, and time.  No cranial nerve deficit. He exhibits normal muscle tone. Coordination normal.  No ataxia on finger to nose bilaterally. No pronator drift. 5/5 strength throughout. CN 2-12 intact.Equal grip strength. Sensation intact.   Skin: Skin is warm.  Psychiatric: He has a normal mood and affect. His behavior is normal.  Nursing note and vitals reviewed.    ED Treatments / Results  Labs (all labs ordered are listed, but only abnormal results are displayed) Labs Reviewed  BASIC METABOLIC PANEL - Abnormal; Notable for the following components:      Result Value   Glucose, Bld 114 (*)    BUN 25 (*)    Creatinine, Ser 1.43 (*)    GFR calc non Af Amer 51 (*)    GFR calc Af Amer 59 (*)    All other components within normal limits  CBC - Abnormal; Notable for the following components:   WBC 16.8 (*)    Hemoglobin 11.9 (*)    HCT 37.3 (*)    All other components within normal limits  URINALYSIS, ROUTINE W REFLEX MICROSCOPIC - Abnormal; Notable for the following components:   APPearance CLOUDY (*)     Hgb urine dipstick LARGE (*)    Protein, ur 30 (*)    Leukocytes, UA LARGE (*)    WBC, UA >50 (*)    Bacteria, UA RARE (*)    All other components within normal limits  TROPONIN I - Abnormal; Notable for the following components:   Troponin I 0.04 (*)    All other components within normal limits  CBG MONITORING, ED - Abnormal; Notable for the following components:   Glucose-Capillary 118 (*)    All other components within normal limits  CULTURE, BLOOD (ROUTINE X 2)  CULTURE, BLOOD (ROUTINE X 2)  URINE CULTURE  LACTIC ACID, PLASMA  LACTIC ACID, PLASMA    EKG EKG Interpretation  Date/Time:  Sunday September 17 2017 05:40:17 EDT Ventricular Rate:  107 PR Interval:    QRS Duration: 115 QT Interval:  351 QTC Calculation: 469 R Axis:   35 Text Interpretation:  Normal sinus rhythm Nonspecific intraventricular conduction delay Borderline low voltage, extremity leads Probable anteroseptal infarct, old Nonspecific repol abnormality, lateral leads Baseline wander in lead(s) V1 ST depression  laterally appears stable Confirmed by Ezequiel Essex [phone]) on 09/17/2017 5:58:41 AM   Radiology No results found.  Procedures Procedures (including critical care time)  Medications Ordered in ED Medications  morphine 4 MG/ML injection 4 mg (has no administration in time range)  sodium chloride 0.9 % bolus 1,000 mL (has no administration in time range)     Initial Impression / Assessment and Plan / ED Course  I have reviewed the triage vital signs and the nursing notes.  Pertinent labs & imaging results that were available during my care of the patient were reviewed by me and considered in my medical decision making (see chart for details).    Patient with urinary retention and syncopal episode while attempting to have a bowel movement.  History of same.  Denies any injury from syncope.  Recent admission 2 weeks ago for complicated UTI in setting of syncope.  Had a reassuring  echocardiogram and EKG.  BLadder scan with >700 cc.  Foley catheter replaced and draining well. He is more comfortable BP and HR improving.   Found to be febrile in ED. WBC 17. Lactate normal. UA positive for infection. Concern for occult sepsis again. Started on Cefepime give previous urine culture with klebsiella. AKI  noted.   Suspect syncope due to vagal response. No injury. EKG with LVH, troponin chronically elevated. No CP or SOB. Home BP meds given.  With sepsis criteria met and complicated UTI, no PCP followup, plan admission for IV antibiotics and further workup. D/w Dr. Wynetta Emery.  BP 133/73 (BP Location: Right Arm)   Pulse 72   Temp 98.8 F (37.1 C) (Oral)   Resp 18   Ht 6' (1.829 m)   Wt 74.8 kg   SpO2 97%   BMI 22.38 kg/m   CRITICAL CARE Performed by: Ezequiel Essex Total critical care time: 31 minutes Critical care time was exclusive of separately billable procedures and treating other patients. Critical care was necessary to treat or prevent imminent or life-threatening deterioration. Critical care was time spent personally by me on the following activities: development of treatment plan with patient and/or surrogate as well as nursing, discussions with consultants, evaluation of patient's response to treatment, examination of patient, obtaining history from patient or surrogate, ordering and performing treatments and interventions, ordering and review of laboratory studies, ordering and review of radiographic studies, pulse oximetry and re-evaluation of patient's condition.  Final Clinical Impressions(s) / ED Diagnoses   Final diagnoses:  Sepsis, due to unspecified organism [facility])  Complicated UTI (urinary tract infection)  Urinary retention  Syncope, unspecified syncope type    ED Discharge Orders    None       Blue Winther, Annie Main, MD 09/17/17 [phone]

## 2017-09-17 NOTE — H&P (Addendum)
History and Physical  Michael [name] RUE:[id number] DOB: 12-22-1955 DOA: 09/17/2017  Referring physician: [place]  PCP: Patient, No Pcp Per   Chief Complaint: Passed out  HPI: Michael [name] is a 62 y.o. male lives home alone presented to the emergency department with a syncopal episode while having a bowel movement.  He has had multiple episodes similar to this in the past and was recently admitted for this approximately 1 week ago.  He was also reporting that he is having difficulty with his indwelling Foley catheter and has not been urinating all night.  He has had painful bladder spasms.  His catheter ended up being changed in the emergency department and a large amount of urine was released from the bladder over 800 cc.  He was noted to be febrile.  His white blood cell count was 17,000.  He had an abnormal urinalysis and was started on treatment for sepsis from presumed urinary tract infection.  The patient felt relieved after the Foley was replaced.  He apparently is being followed by the [facility] in [place].  He does have known significant carotid artery stenosis and was supposed to have followed up with vascular surgery but has not done so at this time.  He apparently is followed by a urologist as well.  He has diagnosed with BPH.  He has had previous Klebsiella and E. coli infections in the past couple of months.  He had a recent echocardiogram with LVEF 55 to 60% and carotid ultrasound with significant stenosis of the right ICA.  His current syncopal episode is thought to be vasovagal.  He is being admitted for sepsis and UTI.  Review of Systems: All systems reviewed and apart from history of presenting illness, are negative.  Past Medical History:  Diagnosis Date  . Acute urinary retention    followed by OP urology   . Carotid stenosis   . CHF (congestive heart failure) (HCC)    LVEF 50-55% 03/2017  . CKD (chronic kidney disease)    Stage II  . Head and neck cancer ([place])  2019   s/p right facial basal cell carcinoma s/p resection with right partial mastectomy and partal rhinectomy with skin graft (06/13/17)  . HTN (hypertension)   . Iron deficiency anemia   . Tobacco abuse    Past Surgical History:  Procedure Laterality Date  . ANKLE CLOSED REDUCTION Right   . COLONOSCOPY N/A 05/31/2017   Procedure: COLONOSCOPY;  Surgeon: Daneil Dolin, MD;  Location: [facility];  Service: Endoscopy;  Laterality: N/A;  2:45pm  . POLYPECTOMY  05/31/2017   Procedure: POLYPECTOMY;  Surgeon: Daneil Dolin, MD;  Location: [facility];  Service: Endoscopy;;  . TONSILLECTOMY     Social History:  reports that he has been smoking cigarettes. He has a 4.00 pack-year smoking history. He has never used smokeless tobacco. He reports that he drank alcohol. He reports that he does not use drugs.  No Known Allergies  Family History  Problem Relation Age of Onset  . Stroke Father   . Cirrhosis Mother   . Colon cancer Neg Hx     Prior to Admission medications   Medication Sig Start Date End Date Taking? Authorizing Provider  amLODipine (NORVASC) 5 MG tablet Take 1 tablet (5 mg total) by mouth daily. 07/07/17  Yes Rai, Ripudeep K, MD  aspirin EC 81 MG tablet Take 81 mg by mouth daily.   Yes [provider]  atorvastatin (LIPITOR) 40  MG tablet Take 1 tablet (40 mg total) by mouth daily at 6 PM. 05/23/17  Yes Derek Jack, MD  carvedilol (COREG) 12.5 MG tablet Take 1 tablet (12.5 mg total) by mouth 2 (two) times daily with a meal. Patient taking differently: Take 12.5 mg by mouth daily.  05/23/17  Yes Derek Jack, MD  finasteride (PROSCAR) 5 MG tablet Take 1 tablet (5 mg total) by mouth daily. 09/03/17  Yes Shah, Pratik D, DO  furosemide (LASIX) 40 MG tablet Take 40 mg by mouth daily.   Yes [provider]   Physical Exam: Vitals:   09/17/17 0640 09/17/17 0641 09/17/17 0714 09/17/17 0815  BP: (!) 190/89  (!) 161/88 133/73  Pulse:  87 82 72  Resp:    18 18  Temp:    98.8 F (37.1 C)  TempSrc:    Oral  SpO2:   97% 97%  Weight:      Height:         General exam: Moderately built and nourished patient, lying comfortably supine on the gurney in no obvious distress.  Head, eyes and ENT: Nontraumatic and normocephalic. Pupils equally reacting to light and accommodation. Oral mucosa moist.  Neck: Supple. No JVD, carotid bruit or thyromegaly.  Lymphatics: No lymphadenopathy.  Respiratory system: Clear to auscultation. No increased work of breathing.  Cardiovascular system: S1 and S2 heard, RRR. No JVD, murmurs, gallops, clicks or pedal edema.  Gastrointestinal system: Abdomen is nondistended, soft and nontender. Normal bowel sounds heard. No organomegaly or masses appreciated.  Genitourinary system: Foley catheter with yellow urine output  Central nervous system: Alert and oriented. No focal neurological deficits.  Extremities: Symmetric 5 x 5 power. Peripheral pulses symmetrically felt.   Skin: No rashes or acute findings.  Musculoskeletal system: Negative exam.  Psychiatry: Pleasant and cooperative.  Labs on Admission:  Basic Metabolic Panel: Recent Labs  Lab 09/17/17 0543  NA 141  K 3.8  CL 104  CO2 24  GLUCOSE 114*  BUN 25*  CREATININE 1.43*  CALCIUM 9.5   Liver Function Tests: No results for input(s): AST, ALT, ALKPHOS, BILITOT, PROT, ALBUMIN in the last 168 hours. No results for input(s): LIPASE, AMYLASE in the last 168 hours. No results for input(s): AMMONIA in the last 168 hours. CBC: Recent Labs  Lab 09/17/17 0543  WBC 16.8*  HGB 11.9*  HCT 37.3*  MCV 83.3  PLT 372   Cardiac Enzymes: Recent Labs  Lab 09/17/17 0543  TROPONINI 0.04*    BNP (last 3 results) No results for input(s): PROBNP in the last 8760 hours. CBG: Recent Labs  Lab 09/17/17 0543  GLUCAP 118*   Radiological Exams on Admission: No results found.  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 Sepsis secondary to UTI- he is responded very well in the past to IV cefepime which has been resumed.  Follow urine cultures follow blood cultures and adjust treatment as appropriate.  Continue supportive care with IV fluid hydration.  His Foley catheter has been replaced in the emergency department.  It is draining well at this time and we will follow  closely. 2. Vasovagal syncope-this is a recurrent episode likely mediated by him having a bowel movement and also bearing down to attempt to pass urine. 3. Carotid arteries disease-he has significant right ICA stenosis and does need to follow-up with vascular surgery for consideration of CEA.  He was seen by Dr. Oneida Alar and he noted that he would consider intervention if stenosis becomes >80%.   4. Acute urinary retention- relieved after the Foley catheter was replaced.  Monitor intake and output closely. 5. BPH-resume home Proscar and he needs to follow-up with urology. 6. Basal cell carcinoma of nasolabial groove - He has had reconstruction facial surgery and treatment at [facility].  He is followed by Dr. Lamonte Richer now for surveillance.  7. Essential hypertension-has been uncontrolled in the recent past.  His blood pressures have improved considerably after relieving the urinary obstruction, resume his home blood pressure medications and follow. 8. Leukocytosis-secondary to urinary tract infection-treating with IV cefepime and will follow CBC. 9. Microcytic Anemia- hemoglobin is much improved from 2 weeks ago, follow.  He was treated with feraheme by heme/onc.  10. Acute kidney injury-likely  postobstructive and anticipate creatinine will improve as the obstruction has been relieved.  Follow BMP. 11. Tobacco abuse-counseled on cessation at the bedside and will provide a nicotine patch for cravings. 12. Fever-secondary to sepsis and UTI.  Tylenol ordered as needed for symptoms.  DVT Prophylaxis: Lovenox Code Status: Full Family Communication: Patient Disposition Plan: PT evaluation requested to assist with planning  Time spent: 1 minutes  Irwin Brakeman, MD [facility] Pager [phone]  If 7PM-7AM, please contact night-coverage [email or web address] Password [facility] 09/17/2017, 8:19 AM

## 2017-09-17 NOTE — ED Triage Notes (Signed)
Pt had syncopal episode when trying to go to bathroom. Pt also has foley cath in place and states he thinks it is blocked as he hasn't been able to "go all night".

## 2017-09-18 DIAGNOSIS — T83510D Infection and inflammatory reaction due to cystostomy catheter, subsequent encounter: Secondary | ICD-10-CM

## 2017-09-18 DIAGNOSIS — I10 Essential (primary) hypertension: Secondary | ICD-10-CM

## 2017-09-18 DIAGNOSIS — A419 Sepsis, unspecified organism: Secondary | ICD-10-CM

## 2017-09-18 DIAGNOSIS — N390 Urinary tract infection, site not specified: Secondary | ICD-10-CM

## 2017-09-18 DIAGNOSIS — I5032 Chronic diastolic (congestive) heart failure: Secondary | ICD-10-CM

## 2017-09-18 LAB — CBC WITH DIFFERENTIAL/PLATELET
BASOS PCT: 1 %
Basophils Absolute: 0.1 10*3/uL (ref 0.0–0.1)
Eosinophils Absolute: 0.3 10*3/uL (ref 0.0–0.7)
Eosinophils Relative: 4 %
HEMATOCRIT: 30.7 % — AB (ref 39.0–52.0)
HEMOGLOBIN: 9.4 g/dL — AB (ref 13.0–17.0)
Lymphocytes Relative: 23 %
Lymphs Abs: 1.9 10*3/uL (ref 0.7–4.0)
MCH: 25.8 pg — ABNORMAL LOW (ref 26.0–34.0)
MCHC: 30.6 g/dL (ref 30.0–36.0)
MCV: 84.1 fL (ref 78.0–100.0)
MONO ABS: 0.9 10*3/uL (ref 0.1–1.0)
MONOS PCT: 11 %
NEUTROS ABS: 5 10*3/uL (ref 1.7–7.7)
NEUTROS PCT: 61 %
Platelets: 278 10*3/uL (ref 150–400)
RBC: 3.65 MIL/uL — ABNORMAL LOW (ref 4.22–5.81)
RDW: 14.8 % (ref 11.5–15.5)
WBC: 8.3 10*3/uL (ref 4.0–10.5)

## 2017-09-18 LAB — BASIC METABOLIC PANEL
ANION GAP: 10 (ref 5–15)
BUN: 18 mg/dL (ref 8–23)
CALCIUM: 8.7 mg/dL — AB (ref 8.9–10.3)
CO2: 25 mmol/L (ref 22–32)
CREATININE: 1.06 mg/dL (ref 0.61–1.24)
Chloride: 109 mmol/L (ref 98–111)
Glucose, Bld: 86 mg/dL (ref 70–99)
Potassium: 3.2 mmol/L — ABNORMAL LOW (ref 3.5–5.1)
Sodium: 144 mmol/L (ref 135–145)

## 2017-09-18 LAB — TROPONIN I: TROPONIN I: 0.04 ng/mL — AB (ref <0.03)

## 2017-09-18 LAB — MAGNESIUM: Magnesium: 2.1 mg/dL (ref 1.7–2.4)

## 2017-09-18 MED ORDER — HYDRALAZINE HCL 20 MG/ML IJ SOLN
10.0000 mg | Freq: Four times a day (QID) | INTRAMUSCULAR | Status: AC | PRN
  Administered 2017-09-18 – 2017-09-19 (×2): 10 mg via INTRAVENOUS
  Filled 2017-09-18 (×2): qty 1

## 2017-09-18 MED ORDER — POTASSIUM CHLORIDE CRYS ER 20 MEQ PO TBCR
40.0000 meq | EXTENDED_RELEASE_TABLET | Freq: Once | ORAL | Status: AC
  Administered 2017-09-18: 40 meq via ORAL
  Filled 2017-09-18: qty 2

## 2017-09-18 MED ORDER — LISINOPRIL 5 MG PO TABS
5.0000 mg | ORAL_TABLET | Freq: Every day | ORAL | Status: DC
  Administered 2017-09-18: 5 mg via ORAL
  Filled 2017-09-18 (×2): qty 1

## 2017-09-18 NOTE — Progress Notes (Signed)
PROGRESS NOTE  Michael [name] EQA:[id number] DOB: 12/13/1955 DOA: 09/17/2017 PCP: Patient, No Pcp Per  HPI/Recap of past 47 hours: 62 year old male with past mental history of chronic diastolic heart failure, basal cell carcinoma status post partial nasal resection and BPH with urinary retention status post indwelling Foley catheter who presented to the emergency room on 8/18 after presenting after a syncopal event, something he has had now several times and has been admitted for recently.  Patient's syncopal event was thought to be vasovagal although he was found to be in sepsis with a UTI secondary to urinary retention.  In the emergency room, catheter was replaced and large amount of urine was evacuated.  Patient started on IV fluids and antibiotics and admitted to the hospitalist service.  This morning, patient feeling somewhat better.  White count normalized.  Blood pressures stable and actually slightly elevated.  Patient himself still feels very weak.  No other complaints.  Assessment/Plan: Principal Problem:   Sepsis ([place]) secondary to urinary tract infection from urinary retention: Foley catheter exchanged.  Continue IV antibiotics, awaiting culture.  Patient met criteria for sepsis on admission given lactic acidosis, leukocytosis, tachycardia and urinary source.  Sepsis now resolved. Active Problems:   Essential hypertension: Stable, blood pressure likely slightly elevated.  Has restarted all home medications   Anemia: Stable.  Continue to monitor hemoglobin.  Hemoglobin on admission likely from hemoconcentration and more accurate now.   Tobacco abuse   Chronic diastolic CHF (congestive heart failure) [facility]): Patient more on dry side when he came in.  Will monitor daily weights, strict input and output   Elevated troponin: Minimally elevated on admission likely from acute kidney injury and not true ACS.  Monitor   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   Carotid artery disease  (HCC)   AKI (acute kidney injury) ([place]): Secondary to sepsis.  Resolved with IV fluids   Catheter-associated urinary tract infection (HCC)   Leukocytosis: Resolved, secondary to infection   Urinary retention with incomplete bladder emptying: Continue urinary catheter.  Outpatient follow-up with urology    Vasovagal syncope: In combination of vasovagal plus hypotension brought on by sepsis.  Treated with IV fluids.  Will have PT get out of bed, monitor orthostatics.   Code Status: Full code  Family Communication: No family  Disposition Plan: Anticipate discharge in the next 24 hours, once urine cultures are back and patient is able to ambulate and use bathroom without any drops in blood pressure.   Consultants:  None  Procedures:  None  Antimicrobials:  Cefepime 8/18-present  DVT prophylaxis: Lovenox   Objective: Vitals:   09/17/17 2253 09/18/17 0602  BP: (!) 168/69 (!) 172/92  Pulse: 70 65  Resp: 20 20  Temp: 98.2 F (36.8 C) 98 F (36.7 C)  SpO2: 96% 98%    Intake/Output Summary (Last 24 hours) at 09/18/2017 1027 Last data filed at 09/18/2017 0550 Gross per 24 hour  Intake 3684 ml  Output 1850 ml  Net 1834 ml   Filed Weights   09/17/17 0528 09/17/17 1011  Weight: 74.8 kg 80.5 kg   Body mass index is 24.07 kg/m.  Exam:   General: Alert and oriented x3, no acute distress  HEENT: Status post resection of basal cell carcinoma across the nasal area, normocephalic, mucous membranes slightly dry  Neck: Supple, no JVD  Cardiovascular: Regular rate and rhythm, S1-S2  Respiratory: Clear to auscultation bilaterally  Abdomen: Soft, nontender, nondistended, positive bowel sounds  Musculoskeletal: No clubbing or  cyanosis or edema  Skin: No skin breaks, tears or lesions other than described above at area of nasal resection  Neuro: No focal deficits  Psychiatry: Appropriate, no evidence of psychoses   Data Reviewed: CBC: Recent Labs  Lab  09/17/17 0543 09/18/17 0545  WBC 16.8* 8.3  NEUTROABS  --  5.0  HGB 11.9* 9.4*  HCT 37.3* 30.7*  MCV 83.3 84.1  PLT 372 195   Basic Metabolic Panel: Recent Labs  Lab 09/17/17 0543 09/18/17 0545  NA 141 144  K 3.8 3.2*  CL 104 109  CO2 24 25  GLUCOSE 114* 86  BUN 25* 18  CREATININE 1.43* 1.06  CALCIUM 9.5 8.7*  MG  --  2.1   GFR: Estimated Creatinine Clearance: 78.3 mL/min (by C-G formula based on SCr of 1.06 mg/dL). Liver Function Tests: No results for input(s): AST, ALT, ALKPHOS, BILITOT, PROT, ALBUMIN in the last 168 hours. No results for input(s): LIPASE, AMYLASE in the last 168 hours. No results for input(s): AMMONIA in the last 168 hours. Coagulation Profile: No results for input(s): INR, PROTIME in the last 168 hours. Cardiac Enzymes: Recent Labs  Lab 09/17/17 0543  TROPONINI 0.04*   BNP (last 3 results) No results for input(s): PROBNP in the last 8760 hours. HbA1C: No results for input(s): HGBA1C in the last 72 hours. CBG: Recent Labs  Lab 09/17/17 0543  GLUCAP 118*   Lipid Profile: No results for input(s): CHOL, HDL, LDLCALC, TRIG, CHOLHDL, LDLDIRECT in the last 72 hours. Thyroid Function Tests: No results for input(s): TSH, T4TOTAL, FREET4, T3FREE, THYROIDAB in the last 72 hours. Anemia Panel: No results for input(s): VITAMINB12, FOLATE, FERRITIN, TIBC, IRON, RETICCTPCT in the last 72 hours. Urine analysis:    Component Value Date/Time   COLORURINE YELLOW 09/17/2017 0631   APPEARANCEUR CLOUDY (A) 09/17/2017 0631   APPEARANCEUR Cloudy 04/23/2011 1044   LABSPEC 1.006 09/17/2017 0631   LABSPEC 1.018 04/23/2011 1044   PHURINE 7.0 09/17/2017 0631   GLUCOSEU NEGATIVE 09/17/2017 0631   GLUCOSEU Negative 04/23/2011 1044   HGBUR LARGE (A) 09/17/2017 0631   BILIRUBINUR NEGATIVE 09/17/2017 0631   BILIRUBINUR Negative 04/23/2011 1044   KETONESUR NEGATIVE 09/17/2017 0631   PROTEINUR 30 (A) 09/17/2017 0631   NITRITE NEGATIVE 09/17/2017 0631    LEUKOCYTESUR LARGE (A) 09/17/2017 0631   LEUKOCYTESUR Trace 04/23/2011 1044   Sepsis Labs: _0 (procalcitonin:4,lacticidven:4)  ) Recent Results (from the past 240 hour(s))  Blood culture (routine x 2)     Status: None (Preliminary result)   Collection Time: 09/17/17  6:22 AM  Result Value Ref Range Status   Specimen Description BLOOD RIGHT ARM  Final   Special Requests   Final    BOTTLES DRAWN AEROBIC AND ANAEROBIC Blood Culture adequate volume   Culture   Final    NO GROWTH 1 DAY Performed at [facility], [address], [place], [place] [address]    Report Status PENDING  Incomplete  Blood culture (routine x 2)     Status: None (Preliminary result)   Collection Time: 09/17/17  6:24 AM  Result Value Ref Range Status   Specimen Description LEFT ANTECUBITAL  Final   Special Requests   Final    BOTTLES DRAWN AEROBIC ONLY Blood Culture adequate volume   Culture   Final    NO GROWTH 1 DAY Performed at [facility], [address] [place], [place] [address]    Report Status PENDING  Incomplete  Urine Culture     Status: None (Preliminary  result)   Collection Time: 09/17/17  6:31 AM  Result Value Ref Range Status   Specimen Description   Final    URINE, CLEAN CATCH Performed at [facility], [address] [place], [place] [address]    Special Requests   Final    NONE Performed at [facility], [address], [place], [place] [address]    Culture   Final    CULTURE REINCUBATED FOR BETTER GROWTH Performed at [facility], [place] [address] [place], [place] [address]    Report Status PENDING  Incomplete      Studies: No results found.  Scheduled Meds: . amLODipine  5 mg Oral Daily  . aspirin EC  81 mg Oral Daily  . atorvastatin  40 mg Oral q1800  . carvedilol  12.5 mg Oral BID WC  . enoxaparin (LOVENOX) injection  40 mg Subcutaneous Q24H  . finasteride  5 mg Oral Daily  . lisinopril  5 mg Oral Daily  . nicotine  21 mg Transdermal Daily  . potassium  chloride  40 mEq Oral Once    Continuous Infusions: . sodium chloride 60 mL/hr at 09/17/17 1826  . ceFEPime (MAXIPIME) IV Stopped (09/18/17 0550)     LOS: 1 day     Annita Brod, MD [facility]  To reach me or the doctor on call, go to: [email or web address] Password [email or web address]  09/18/2017, 10:27 AM

## 2017-09-18 NOTE — Care Management (Signed)
Pt from home, no insurance, no PCP. Pt has not [facility] up with [facility] as instructed on previous visits. Pt says he has never received a list of provider options in [facility] for uninsured. CM has provided list in past and has again provided list today. Pt says his medicaid should be approved any time.

## 2017-09-18 NOTE — Evaluation (Signed)
Physical Therapy Evaluation Patient Details Name: Michael [name] MRN: [id number] DOB: [birth year] Today's Date: 09/18/2017   History of Present Illness  Michael [name] is a 62 y.o. male lives home alone presented to the emergency department with a syncopal episode while having a bowel movement.  He has had multiple episodes similar to this in the past and was recently admitted for this approximately 1 week ago.  He was also reporting that he is having difficulty with his indwelling Foley catheter and has not been urinating all night.  He has had painful bladder spasms.  His catheter ended up being changed in the emergency department and a large amount of urine was released from the bladder over 800 cc.  He was noted to be febrile.  His white blood cell count was 17,000.  He had an abnormal urinalysis and was started on treatment for sepsis from presumed urinary tract infection.  The patient felt relieved after the Foley was replaced.  He apparently is being followed by the [facility] in [place].  He does have known significant carotid artery stenosis and was supposed to have followed up with vascular surgery but has not done so at this time.  He apparently is followed by a urologist as well.  He has diagnosed with BPH.  He has had previous Klebsiella and E. coli infections in the past couple of months.  He had a recent echocardiogram with LVEF 55 to 60% and carotid ultrasound with significant stenosis of the right ICA.  His current syncopal episode is thought to be vasovagal.  He is being admitted for sepsis and UTI.  Clinical Impression  Pt received in bed and was agreeable to PT evaluation. Pt admitted with above diagnosis. He is from home alone where he is independent with all ADLs and IADLs, ambulating without assistance or AD. Pt has had 3 falls in the last 3 months; x2 falls were in the last week when he had syncopal episode when using the restroom and the other occurred when he stood up from  sitting. Per nurse tech, pt's orthostatics were WNL earlier today. Currently, pt able to ambulate 522ft with IV pole, no LOB or unsteadiness noted along with no reports of dizziness or lightheadedness. At this time, pt is at baseline level of function and does not require any PT once medically ready for d/c. Pt discharged from physical therapy to care of nursing for ambulation daily as tolerated until ready for d/c.      Follow Up Recommendations No PT follow up    Equipment Recommendations  None recommended by PT    Recommendations for Other Services       Precautions / Restrictions Precautions Precautions: None Precaution Comments: does report 3 falls in the last 6 months -- x2 from valsalva maneuver when using restroom, x1 when he stood up and passed out      Mobility  Bed Mobility Overal bed mobility: Independent                Transfers Overall transfer level: Independent                  Ambulation/Gait Ambulation/Gait assistance: Modified independent (Device/Increase time) Gait Distance (Feet): 500 Feet Assistive device: IV Pole Gait Pattern/deviations: WFL(Within Functional Limits)     General Gait Details: WFL, no LOB noted, steady throughout, no reports of lightheadedness or dizziness  Stairs            Wheelchair Mobility    Modified Rankin (  Stroke Patients Only)       Balance Overall balance assessment: No apparent balance deficits (not formally assessed)                                           Pertinent Vitals/Pain Pain Assessment: No/denies pain    Home Living Family/patient expects to be discharged to:: Private residence Living Arrangements: Alone   Type of Home: House Home Access: Ramped entrance     Home Layout: One level Home Equipment: None      Prior Function Level of Independence: Independent         Comments: reports fully independent with all ADLs and IADLs. No AD required.     Hand  Dominance   Dominant Hand: Right    Extremity/Trunk Assessment   Upper Extremity Assessment Upper Extremity Assessment: Overall WFL for tasks assessed    Lower Extremity Assessment Lower Extremity Assessment: Overall WFL for tasks assessed    Cervical / Trunk Assessment Cervical / Trunk Assessment: Normal  Communication   Communication: No difficulties  Cognition Arousal/Alertness: Awake/alert Behavior During Therapy: WFL for tasks assessed/performed Overall Cognitive Status: Within Functional Limits for tasks assessed                                        General Comments      Exercises     Assessment/Plan    PT Assessment Patent does not need any further PT services  PT Problem List         PT Treatment Interventions      PT Goals (Current goals can be found in the Care Plan section)  Acute Rehab PT Goals Patient Stated Goal: home PT Goal Formulation: With patient Time For Goal Achievement: 09/25/17 Potential to Achieve Goals: Good    Frequency     Barriers to discharge        Co-evaluation               AM-PAC PT "6 Clicks" Daily Activity  Outcome Measure                  End of Session Equipment Utilized During Treatment: Gait belt Activity Tolerance: Patient tolerated treatment well Patient left: in bed;with call bell/phone within reach Nurse Communication: Mobility status PT Visit Diagnosis: Repeated falls (R29.6);History of falling (Z91.81)    Time: 2800-3491 PT Time Calculation (min) (ACUTE ONLY): 19 min   Charges:   PT Evaluation $PT Eval Low Complexity: 1 Low             Geraldine Solar PT, DPT

## 2017-09-18 NOTE — Progress Notes (Signed)
MD paged about PT elevated BP. No further orders at this time.

## 2017-09-19 DIAGNOSIS — C44311 Basal cell carcinoma of skin of nose: Secondary | ICD-10-CM

## 2017-09-19 DIAGNOSIS — N390 Urinary tract infection, site not specified: Secondary | ICD-10-CM

## 2017-09-19 DIAGNOSIS — R748 Abnormal levels of other serum enzymes: Secondary | ICD-10-CM

## 2017-09-19 DIAGNOSIS — T83511D Infection and inflammatory reaction due to indwelling urethral catheter, subsequent encounter: Secondary | ICD-10-CM

## 2017-09-19 LAB — CBC WITH DIFFERENTIAL/PLATELET
BASOS PCT: 1 %
Basophils Absolute: 0.1 10*3/uL (ref 0.0–0.1)
EOS PCT: 4 %
Eosinophils Absolute: 0.3 10*3/uL (ref 0.0–0.7)
HEMATOCRIT: 30.1 % — AB (ref 39.0–52.0)
Hemoglobin: 9.2 g/dL — ABNORMAL LOW (ref 13.0–17.0)
LYMPHS PCT: 22 %
Lymphs Abs: 1.8 10*3/uL (ref 0.7–4.0)
MCH: 25.9 pg — ABNORMAL LOW (ref 26.0–34.0)
MCHC: 30.6 g/dL (ref 30.0–36.0)
MCV: 84.8 fL (ref 78.0–100.0)
MONO ABS: 0.9 10*3/uL (ref 0.1–1.0)
MONOS PCT: 11 %
Neutro Abs: 5.4 10*3/uL (ref 1.7–7.7)
Neutrophils Relative %: 64 %
PLATELETS: 286 10*3/uL (ref 150–400)
RBC: 3.55 MIL/uL — ABNORMAL LOW (ref 4.22–5.81)
RDW: 14.6 % (ref 11.5–15.5)
WBC: 8.4 10*3/uL (ref 4.0–10.5)

## 2017-09-19 LAB — BASIC METABOLIC PANEL
Anion gap: 7 (ref 5–15)
BUN: 14 mg/dL (ref 8–23)
CALCIUM: 8.4 mg/dL — AB (ref 8.9–10.3)
CO2: 24 mmol/L (ref 22–32)
Chloride: 111 mmol/L (ref 98–111)
Creatinine, Ser: 0.86 mg/dL (ref 0.61–1.24)
GFR calc Af Amer: >60 mL/min (ref >60)
GFR calc non Af Amer: >60 mL/min (ref >60)
GLUCOSE: 99 mg/dL (ref 70–99)
Potassium: 3.2 mmol/L — ABNORMAL LOW (ref 3.5–5.1)
Sodium: 142 mmol/L (ref 135–145)

## 2017-09-19 LAB — MAGNESIUM: Magnesium: 2 mg/dL (ref 1.7–2.4)

## 2017-09-19 MED ORDER — CEFDINIR 300 MG PO CAPS
300.0000 mg | ORAL_CAPSULE | Freq: Two times a day (BID) | ORAL | Status: DC
  Administered 2017-09-19: 300 mg via ORAL
  Filled 2017-09-19: qty 1

## 2017-09-19 MED ORDER — FERROUS SULFATE 325 (65 FE) MG PO TABS
325.0000 mg | ORAL_TABLET | Freq: Every day | ORAL | Status: DC
  Administered 2017-09-19: 325 mg via ORAL
  Filled 2017-09-19: qty 1

## 2017-09-19 MED ORDER — CEFDINIR 300 MG PO CAPS: 300.0000 mg | ORAL_CAPSULE | Freq: Two times a day (BID) | ORAL | 0 refills | Status: DC

## 2017-09-19 MED ORDER — CARVEDILOL 12.5 MG PO TABS
25.0000 mg | ORAL_TABLET | Freq: Two times a day (BID) | ORAL | Status: DC
  Administered 2017-09-19: 25 mg via ORAL
  Filled 2017-09-19: qty 2

## 2017-09-19 MED ORDER — FERROUS SULFATE 325 (65 FE) MG PO TABS: 325.0000 mg | ORAL_TABLET | Freq: Every day | ORAL | 3 refills | Status: DC

## 2017-09-19 MED ORDER — POTASSIUM CHLORIDE CRYS ER 20 MEQ PO TBCR
40.0000 meq | EXTENDED_RELEASE_TABLET | Freq: Once | ORAL | Status: AC
Start: 2017-09-19 — End: 2017-09-19
  Administered 2017-09-19: 40 meq via ORAL
  Filled 2017-09-19: qty 2

## 2017-09-19 MED ORDER — CARVEDILOL 25 MG PO TABS: 25.0000 mg | ORAL_TABLET | Freq: Two times a day (BID) | ORAL | 1 refills | Status: DC

## 2017-09-19 NOTE — Care Management Note (Signed)
Case Management Note  Patient Details  Name: Cristina Mattern MRN: [id number] Date of Birth: 05-Feb-1955   Expected Discharge Date:  09/19/17               Expected Discharge Plan:  Home/Self Care  In-House Referral:  NA  Discharge planning Services  CM Consult, [facility], Medication Assistance  Post Acute Care Choice:  NA Choice offered to:  NA  Status of Service:  Completed, signed off  If discussed at Long Length of Stay Meetings, dates discussed:    Additional Comments: DC home today with self care. Pt was given [place] in March 2019. Being Rx abx, CM gave patient GoodRx card. Pt says he will be able to afford medication.   Sherald Barge, RN 09/19/2017, 11:33 AM

## 2017-09-19 NOTE — Progress Notes (Signed)
Michael [name] discharged Home per MD order.  Discharge instructions reviewed and discussed with the patient, all questions and concerns answered. Copy of instructions and scripts given to patient. Pt's bedside foley bag changed to a leg bag.  Allergies as of 09/19/2017   No Known Allergies     Medication List    STOP taking these medications   lisinopril 5 MG tablet Commonly known as:  PRINIVIL,ZESTRIL     TAKE these medications   amLODipine 5 MG tablet Commonly known as:  NORVASC Take 1 tablet (5 mg total) by mouth daily.   aspirin EC 81 MG tablet Take 81 mg by mouth daily.   atorvastatin 40 MG tablet Commonly known as:  LIPITOR Take 1 tablet (40 mg total) by mouth daily at 6 PM.   carvedilol 25 MG tablet Commonly known as:  COREG Take 1 tablet (25 mg total) by mouth 2 (two) times daily with a meal. What changed:    medication strength  how much to take   cefdinir 300 MG capsule Commonly known as:  OMNICEF Take 1 capsule (300 mg total) by mouth every 12 (twelve) hours.   ferrous sulfate 325 (65 FE) MG tablet Take 1 tablet (325 mg total) by mouth daily with breakfast.   finasteride 5 MG tablet Commonly known as:  PROSCAR Take 1 tablet (5 mg total) by mouth daily.   furosemide 40 MG tablet Commonly known as:  LASIX Take 40 mg by mouth daily.       Patients skin is clean, dry and intact, no evidence of skin break down. IV site discontinued and catheter remains intact. Site without signs and symptoms of complications. Dressing and pressure applied.  Patient ambulated to the elevator,  no distress noted upon discharge.  Ralene Muskrat Mardella Nuckles 09/19/2017 12:46 PM

## 2017-09-19 NOTE — Discharge Summary (Signed)
Physician Discharge Summary  Michael [name] SFK:[id number] DOB: [birth year] DOA: 09/17/2017  PCP: Patient, No Pcp Per  Admit date: 09/17/2017 Discharge date: 09/19/2017  Admitted From: home Disposition:  Home   Recommendations for Outpatient Follow-up:  1. Follow up with PCP in 1-2 weeks 2. Please obtain BMP/CBC in one week     Discharge Condition: Stable CODE STATUS: FULL Diet recommendation: Heart Healthy   Brief/Interim Summary: 62 year old male with a history of diastolic CHF, CKD stage II, basal cell carcinoma status post partial rhinectomy and maxillectomy on 06/06/2017 at [facility], urinary retention with indwelling Foley catheter presenting with a syncopal event after having a bowel movement.  Apparently, the patient was getting up from the commode when he passed out.  He woke up on the floor, but denied any injuries including hitting his head.  The patient denied any prodromal symptoms including chest discomfort, palpitations, or aura.  The patient had a similar presentation when he was recently admitted from 09/01/2017 through 09/02/2017 when he had a syncopal episode while straining to urinate.  At that time, his Foley catheter had been removed, and he was having difficulty urinating.  In the emergency department, the patient's Foley catheter was replaced on 09/17/2017.  He was noted to have WBC 16.8 with UA >50 WBC.  The patient was started on cefepime and admitted for further work-up.  Discharge Diagnoses:  Sepsis -Present at the time of admission -Presented with tachycardia, leukocytosis, and urinary source -Sepsis physiology resolved -Continued IV fluids -Lactic acid peaked 1.4  Catheter associated UTI -Patient has had an indwelling Foley catheter for approximately 4 to 5 months -Foley catheter changed 09/17/2017 -Continue cefepime pending culture data -Outpatient urology follow-up -d/c home with cefdinir 300 mg bid x 4 more days  Syncope -Secondary to volume depletion and  vasovagal etiology -07/06/2017 echo EF 55-60%, no WMA, trivial MR, mild TR -Repeat orthostatic vital signs negative  Chronic diastolic CHF -Patient is clinically euvolemic -Daily weights -07/06/2017 echo EF 55-60%, no WMA, trivial MR, mild TR  Essential hypertension -Patient was only taking carvedilol as an outpatient -He was not on amlodipine and lisinopril as the MAR suggests -He does not have a PCP -Increase carvedilol to 25 mg twice daily -add amlodipine 5 mg daily  Right Carotid stenosis -The patient has follow-up with Dr. Juanda [place] -05/12/2017 carotid ultrasound shows 80-99% right carotid stenosis -He will ultimately need carotid endarterectomy -continue ASA  CKD stage II -Baseline creatinine 1.0-1.3 -Monitor BMP  Hypokalemia -Replete -Magnesium 2.0  Basal cell carcinoma of nasolabial fold -06/06/2017--right partial rhinectomy, resection of left intranasal lesion, skin graft @[facility] -Margins were clear on pathology -Most recent follow-up was 06/23/2017  Elevated troponin -Secondary to demand ischemia -No chest pain presently -Personally reviewed EKG--sinus rhythm, nonspecific ST changes  Iron deficiency anemia -The patient had colonoscopy 05/31/2017--Dr. Rourk -He was noted to have numerous polyps as well as a polypoid mass in the hepatic flexure to the cecum area -He was referred to Dr. Blake Divine for hemicolectomy -He has been lost to follow-up  Tobacco abuse I have discussed tobacco cessation with the patient.  I have counseled the patient regarding the negative impacts of continued tobacco use including but not limited to lung cancer, COPD, and cardiovascular disease.  I have discussed alternatives to tobacco and modalities that may help facilitate tobacco cessation including but not limited to biofeedback, hypnosis, and medications.  Total time spent with tobacco counseling was 4 minutes.     Discharge Instructions   Allergies as of  09/19/2017   No Known Allergies     Medication List    STOP taking these medications   lisinopril 5 MG tablet Commonly known as:  PRINIVIL,ZESTRIL     TAKE these medications   amLODipine 5 MG tablet Commonly known as:  NORVASC Take 1 tablet (5 mg total) by mouth daily.   aspirin EC 81 MG tablet Take 81 mg by mouth daily.   atorvastatin 40 MG tablet Commonly known as:  LIPITOR Take 1 tablet (40 mg total) by mouth daily at 6 PM.   carvedilol 25 MG tablet Commonly known as:  COREG Take 1 tablet (25 mg total) by mouth 2 (two) times daily with a meal. What changed:    medication strength  how much to take   cefdinir 300 MG capsule Commonly known as:  OMNICEF Take 1 capsule (300 mg total) by mouth every 12 (twelve) hours.   ferrous sulfate 325 (65 FE) MG tablet Take 1 tablet (325 mg total) by mouth daily with breakfast.   finasteride 5 MG tablet Commonly known as:  PROSCAR Take 1 tablet (5 mg total) by mouth daily.   furosemide 40 MG tablet Commonly known as:  LASIX Take 40 mg by mouth daily.      Follow-up Information    Rourk, Cristopher Estimable, MD Follow up in 1 month(s).   Specialty:  Gastroenterology Contact information: [address] [place] [address] [phone]        Aviva Signs, MD Follow up in 1 week(s).   Specialty:  General Surgery Contact information: [address] [place] [address] [phone]          No Known Allergies  Consultations:  none   Procedures/Studies: Dg Chest 2 View  Result Date: 09/01/2017 CLINICAL DATA:  Syncope. EXAM: CHEST - 2 VIEW COMPARISON:  Radiograph 07/04/2017.  PET CT 04/28/2017 FINDINGS: The cardiomediastinal contours are normal. The lungs are clear. Pulmonary vasculature is normal. No consolidation, pleural effusion, or pneumothorax. No acute osseous abnormalities are seen. IMPRESSION: No acute pulmonary process. Electronically Signed   By: Jeb Levering M.D.   On: 09/01/2017 05:53    Ct Head Wo Contrast  Result Date: 09/01/2017 CLINICAL DATA:  Syncopal episode with bowel movement. History of hypertension, carotid stenosis. EXAM: CT HEAD WITHOUT CONTRAST TECHNIQUE: Contiguous axial images were obtained from the base of the skull through the vertex without intravenous contrast. COMPARISON:  CT HEAD July 04, 2017 FINDINGS: BRAIN: No intraparenchymal hemorrhage, mass effect nor midline shift. Mild parenchymal brain volume loss. No hydrocephalus. Confluent supratentorial white matter hypodensities. Old basal ganglia and RIGHT thalamus lacunar infarcts. No acute large vascular territory infarcts. No abnormal extra-axial fluid collections. Basal cisterns are patent. VASCULAR: Moderate calcific atherosclerosis of the carotid siphons. SKULL: No skull fracture. Old RIGHT anterior maxillary wall ORIF. No significant scalp soft tissue swelling. LEFT suboccipital tricholemmal cyst. SINUSES/ORBITS: The mastoid air-cells and included paranasal sinuses are well-aerated.The included ocular globes and orbital contents are non-suspicious. OTHER: None. IMPRESSION: 1. No acute intracranial process. 2. Severe chronic small vessel ischemic changes and old lacunar infarcts. 3. Mild parenchymal brain volume loss. Electronically Signed   By: Elon Alas M.D.   On: 09/01/2017 05:47        Discharge Exam: Vitals:   09/19/17 0630 09/19/17 0802  BP: (!) 181/108 (!) 152/79  Pulse: 71 77  Resp: 18   Temp: 98.6 F (37 C)   SpO2: 99% 100%   Vitals:   09/18/17 2131 09/18/17 2253 09/19/17 0630 09/19/17  0802  BP:  138/62 (!) 181/108 (!) 152/79  Pulse:   71 77  Resp:   18   Temp:   98.6 F (37 C)   TempSrc:   Oral   SpO2: 98%  99% 100%  Weight:   76.3 kg   Height:        General: Pt is alert, awake, not in acute distress Cardiovascular: RRR, S1/S2 +, no rubs, no gallops Respiratory: CTA bilaterally, no wheezing, no rhonchi Abdominal: Soft, NT, ND, bowel sounds + Extremities: no edema, no  cyanosis   The results of significant diagnostics from this hospitalization (including imaging, microbiology, ancillary and laboratory) are listed below for reference.    Significant Diagnostic Studies: Dg Chest 2 View  Result Date: 09/01/2017 CLINICAL DATA:  Syncope. EXAM: CHEST - 2 VIEW COMPARISON:  Radiograph 07/04/2017.  PET CT 04/28/2017 FINDINGS: The cardiomediastinal contours are normal. The lungs are clear. Pulmonary vasculature is normal. No consolidation, pleural effusion, or pneumothorax. No acute osseous abnormalities are seen. IMPRESSION: No acute pulmonary process. Electronically Signed   By: Jeb Levering M.D.   On: 09/01/2017 05:53   Ct Head Wo Contrast  Result Date: 09/01/2017 CLINICAL DATA:  Syncopal episode with bowel movement. History of hypertension, carotid stenosis. EXAM: CT HEAD WITHOUT CONTRAST TECHNIQUE: Contiguous axial images were obtained from the base of the skull through the vertex without intravenous contrast. COMPARISON:  CT HEAD July 04, 2017 FINDINGS: BRAIN: No intraparenchymal hemorrhage, mass effect nor midline shift. Mild parenchymal brain volume loss. No hydrocephalus. Confluent supratentorial white matter hypodensities. Old basal ganglia and RIGHT thalamus lacunar infarcts. No acute large vascular territory infarcts. No abnormal extra-axial fluid collections. Basal cisterns are patent. VASCULAR: Moderate calcific atherosclerosis of the carotid siphons. SKULL: No skull fracture. Old RIGHT anterior maxillary wall ORIF. No significant scalp soft tissue swelling. LEFT suboccipital tricholemmal cyst. SINUSES/ORBITS: The mastoid air-cells and included paranasal sinuses are well-aerated.The included ocular globes and orbital contents are non-suspicious. OTHER: None. IMPRESSION: 1. No acute intracranial process. 2. Severe chronic small vessel ischemic changes and old lacunar infarcts. 3. Mild parenchymal brain volume loss. Electronically Signed   By: Elon Alas M.D.    On: 09/01/2017 05:47     Microbiology: Recent Results (from the past 240 hour(s))  Blood culture (routine x 2)     Status: None (Preliminary result)   Collection Time: 09/17/17  6:22 AM  Result Value Ref Range Status   Specimen Description BLOOD RIGHT ARM  Final   Special Requests   Final    BOTTLES DRAWN AEROBIC AND ANAEROBIC Blood Culture adequate volume   Culture   Final    NO GROWTH 2 DAYS Performed at [facility], [address] [place], [place] [address]    Report Status PENDING  Incomplete  Blood culture (routine x 2)     Status: None (Preliminary result)   Collection Time: 09/17/17  6:24 AM  Result Value Ref Range Status   Specimen Description LEFT ANTECUBITAL  Final   Special Requests   Final    BOTTLES DRAWN AEROBIC ONLY Blood Culture adequate volume   Culture   Final    NO GROWTH 2 DAYS Performed at [facility], [address] [place], [place] [address]    Report Status PENDING  Incomplete  Urine Culture     Status: Abnormal (Preliminary result)   Collection Time: 09/17/17  6:31 AM  Result Value Ref Range Status   Specimen Description   Final    URINE, CLEAN CATCH  Performed at [facility], [address] [place], [place] [address]    Special Requests   Final    NONE Performed at [facility], [address] [place], [place] [address]    Culture >=100,000 COLONIES/mL GRAM NEGATIVE RODS (A)  Final   Report Status PENDING  Incomplete     Labs: Basic Metabolic Panel: Recent Labs  Lab 09/17/17 0543 09/18/17 0545 09/19/17 0531  NA 141 144 142  K 3.8 3.2* 3.2*  CL 104 109 111  CO2 24 25 24   GLUCOSE 114* 86 99  BUN 25* 18 14  CREATININE 1.43* 1.06 0.86  CALCIUM 9.5 8.7* 8.4*  MG  --  2.1 2.0   Liver Function Tests: No results for input(s): AST, ALT, ALKPHOS, BILITOT, PROT, ALBUMIN in the last 168 hours. No results for input(s): LIPASE, AMYLASE in the last 168 hours. No results for input(s): AMMONIA in the last 168 hours. CBC: Recent Labs  Lab  09/17/17 0543 09/18/17 0545 09/19/17 0531  WBC 16.8* 8.3 8.4  NEUTROABS  --  5.0 5.4  HGB 11.9* 9.4* 9.2*  HCT 37.3* 30.7* 30.1*  MCV 83.3 84.1 84.8  PLT 372 278 286   Cardiac Enzymes: Recent Labs  Lab 09/17/17 0543 09/18/17 1210  TROPONINI 0.04* 0.04*   BNP: Invalid input(s): POCBNP CBG: Recent Labs  Lab 09/17/17 0543  GLUCAP 118*    Time coordinating discharge:  36 minutes  Signed:  Orson Eva, DO [facility] Pager: [phone] 09/19/2017, 10:57 AM

## 2017-09-19 NOTE — Progress Notes (Signed)
PROGRESS NOTE  Michael [name] NKN:[id number] DOB: 07-Jan-1956 DOA: 09/17/2017 PCP: Patient, No Pcp Per  Brief History:  62 year old male with a history of diastolic CHF, CKD stage II, basal cell carcinoma status post partial rhinectomy and maxillectomy on 06/06/2017 at [facility], urinary retention with indwelling Foley catheter presenting with a syncopal event after having a bowel movement.  Apparently, the patient was getting up from the commode when he passed out.  He woke up on the floor, but denied any injuries including hitting his head.  The patient denied any prodromal symptoms including chest discomfort, palpitations, or aura.  The patient had a similar presentation when he was recently admitted from 09/01/2017 through 09/02/2017 when he had a syncopal episode while straining to urinate.  At that time, his Foley catheter had been removed, and he was having difficulty urinating.  In the emergency department, the patient's Foley catheter was replaced on 09/17/2017.  He was noted to have WBC 16.8 with UA >50 WBC.  The patient was started on cefepime and admitted for further work-up.  Assessment/Plan: Sepsis -Present at the time of admission -Presented with tachycardia, leukocytosis, and urinary source -Sepsis physiology resolved -Continue IV fluids -Lactic acid peaked 1.4  Catheter associated UTI -Patient has had an indwelling Foley catheter for approximately 4 to 5 months -Foley catheter changed 09/17/2017 -Continue cefepime pending culture data -Outpatient urology follow-up  Syncope -Secondary to volume depletion and vasovagal etiology -07/06/2017 echo EF 55-60%, no WMA, trivial MR, mild TR -Repeat orthostatic vital signs negative  Chronic diastolic CHF -Patient is clinically euvolemic -Daily weights -07/06/2017 echo EF 55-60%, no WMA, trivial MR, mild TR  Essential hypertension -Patient was only taking carvedilol as an outpatient -He was not on amlodipine and lisinopril as the MAR  suggests -He does not have a PCP -Increase carvedilol to 25 mg twice daily  Right Carotid stenosis -The patient has follow-up with Dr. Juanda [place] -05/12/2017 carotid ultrasound shows 80-99% right carotid stenosis -He will ultimately need carotid endarterectomy  CKD stage II -Baseline creatinine 1.0-1.3 -Monitor BMP  Hypokalemia -Replete -Magnesium 2.0  Basal cell carcinoma of nasolabial fold -06/06/2017--right partial rhinectomy, resection of left intranasal lesion, skin graft @[facility] -Margins were clear on pathology -Most recent follow-up was 06/23/2017  Elevated troponin -Secondary to demand ischemia -No chest pain presently -Personally reviewed EKG--sinus rhythm, nonspecific ST changes  Iron deficiency anemia -The patient had colonoscopy 05/31/2017--Dr. Rourk -He was noted to have numerous polyps as well as a polypoid mass in the hepatic flexure to the cecum area -He was referred to Dr. Blake Divine for hemicolectomy -He has been lost to follow-up  Tobacco abuse I have discussed tobacco cessation with the patient.  I have counseled the patient regarding the negative impacts of continued tobacco use including but not limited to lung cancer, COPD, and cardiovascular disease.  I have discussed alternatives to tobacco and modalities that may help facilitate tobacco cessation including but not limited to biofeedback, hypnosis, and medications.  Total time spent with tobacco counseling was 4 minutes.     Disposition Plan:   Home  Family Communication:  No Family at bedside  Consultants:  none  Code Status:  FULL  DVT Prophylaxis:  [place] Heparin / [place] Lovenox   Procedures: As Listed in Progress Note Above  Antibiotics: None    Subjective: Patient denies fevers, chills, headache, chest pain, dyspnea, nausea, vomiting, diarrhea, abdominal pain, dysuria, hematuria, hematochezia, and melena.   Objective: Vitals:  09/18/17 2038 09/18/17 2131 09/18/17 2253 09/19/17  0630  BP: (!) 171/92  138/62 (!) 181/108  Pulse: 66   71  Resp:    18  Temp: 98.1 F (36.7 C)   98.6 F (37 C)  TempSrc: Oral   Oral  SpO2: 99% 98%  99%  Weight:    76.3 kg  Height:        Intake/Output Summary (Last 24 hours) at 09/19/2017 0738 Last data filed at 09/19/2017 0636 Gross per 24 hour  Intake 2698.11 ml  Output -  Net 2698.11 ml   Weight change: 0.8 kg Exam:   General:  Pt is alert, follows commands appropriately, not in acute distress  HEENT: No icterus, No thrush, No neck mass, [place]/AT  Cardiovascular: RRR, S1/S2, no rubs, no gallops  Respiratory: CTA bilaterally, no wheezing, no crackles, no rhonchi  Abdomen: Soft/+BS, non tender, non distended, no guarding  Extremities: No edema, No lymphangitis, No petechiae, No rashes, no synovitis   Data Reviewed: I have personally reviewed following labs and imaging studies Basic Metabolic Panel: Recent Labs  Lab 09/17/17 0543 09/18/17 0545 09/19/17 0531  NA 141 144 142  K 3.8 3.2* 3.2*  CL 104 109 111  CO2 24 25 24   GLUCOSE 114* 86 99  BUN 25* 18 14  CREATININE 1.43* 1.06 0.86  CALCIUM 9.5 8.7* 8.4*  MG  --  2.1 2.0   Liver Function Tests: No results for input(s): AST, ALT, ALKPHOS, BILITOT, PROT, ALBUMIN in the last 168 hours. No results for input(s): LIPASE, AMYLASE in the last 168 hours. No results for input(s): AMMONIA in the last 168 hours. Coagulation Profile: No results for input(s): INR, PROTIME in the last 168 hours. CBC: Recent Labs  Lab 09/17/17 0543 09/18/17 0545 09/19/17 0531  WBC 16.8* 8.3 8.4  NEUTROABS  --  5.0 5.4  HGB 11.9* 9.4* 9.2*  HCT 37.3* 30.7* 30.1*  MCV 83.3 84.1 84.8  PLT 372 278 286   Cardiac Enzymes: Recent Labs  Lab 09/17/17 0543 09/18/17 1210  TROPONINI 0.04* 0.04*   BNP: Invalid input(s): POCBNP CBG: Recent Labs  Lab 09/17/17 0543  GLUCAP 118*   HbA1C: No results for input(s): HGBA1C in the last 72 hours. Urine analysis:    Component Value  Date/Time   COLORURINE YELLOW 09/17/2017 0631   APPEARANCEUR CLOUDY (A) 09/17/2017 0631   APPEARANCEUR Cloudy 04/23/2011 1044   LABSPEC 1.006 09/17/2017 0631   LABSPEC 1.018 04/23/2011 1044   PHURINE 7.0 09/17/2017 0631   GLUCOSEU NEGATIVE 09/17/2017 0631   GLUCOSEU Negative 04/23/2011 1044   HGBUR LARGE (A) 09/17/2017 0631   BILIRUBINUR NEGATIVE 09/17/2017 0631   BILIRUBINUR Negative 04/23/2011 1044   KETONESUR NEGATIVE 09/17/2017 0631   PROTEINUR 30 (A) 09/17/2017 0631   NITRITE NEGATIVE 09/17/2017 0631   LEUKOCYTESUR LARGE (A) 09/17/2017 0631   LEUKOCYTESUR Trace 04/23/2011 1044   Sepsis Labs: @LABRCNTIP (procalcitonin:4,lacticidven:4) ) Recent Results (from the past 240 hour(s))  Blood culture (routine x 2)     Status: None (Preliminary result)   Collection Time: 09/17/17  6:22 AM  Result Value Ref Range Status   Specimen Description BLOOD RIGHT ARM  Final   Special Requests   Final    BOTTLES DRAWN AEROBIC AND ANAEROBIC Blood Culture adequate volume   Culture   Final    NO GROWTH 1 DAY Performed at [facility], [address] [place], [place] [address]    Report Status PENDING  Incomplete  Blood culture (routine x 2)  Status: None (Preliminary result)   Collection Time: 09/17/17  6:24 AM  Result Value Ref Range Status   Specimen Description LEFT ANTECUBITAL  Final   Special Requests   Final    BOTTLES DRAWN AEROBIC ONLY Blood Culture adequate volume   Culture   Final    NO GROWTH 1 DAY Performed at [facility], [address], [place], [place] [address]    Report Status PENDING  Incomplete  Urine Culture     Status: Abnormal (Preliminary result)   Collection Time: 09/17/17  6:31 AM  Result Value Ref Range Status   Specimen Description   Final    URINE, CLEAN CATCH Performed at [facility], [address], [place], [place] [address]    Special Requests   Final    NONE Performed at [facility], [address] [place], [place] [address]    Culture  >=100,000 COLONIES/mL GRAM NEGATIVE RODS (A)  Final   Report Status PENDING  Incomplete     Scheduled Meds: . amLODipine  5 mg Oral Daily  . aspirin EC  81 mg Oral Daily  . atorvastatin  40 mg Oral q1800  . carvedilol  12.5 mg Oral BID WC  . enoxaparin (LOVENOX) injection  40 mg Subcutaneous Q24H  . finasteride  5 mg Oral Daily  . lisinopril  5 mg Oral Daily  . nicotine  21 mg Transdermal Daily   Continuous Infusions: . sodium chloride 60 mL/hr at 09/19/17 0500  . ceFEPime (MAXIPIME) IV Stopped (09/19/17 0536)    Procedures/Studies: Dg Chest 2 View  Result Date: 09/01/2017 CLINICAL DATA:  Syncope. EXAM: CHEST - 2 VIEW COMPARISON:  Radiograph 07/04/2017.  PET CT 04/28/2017 FINDINGS: The cardiomediastinal contours are normal. The lungs are clear. Pulmonary vasculature is normal. No consolidation, pleural effusion, or pneumothorax. No acute osseous abnormalities are seen. IMPRESSION: No acute pulmonary process. Electronically Signed   By: Jeb Levering M.D.   On: 09/01/2017 05:53   Ct Head Wo Contrast  Result Date: 09/01/2017 CLINICAL DATA:  Syncopal episode with bowel movement. History of hypertension, carotid stenosis. EXAM: CT HEAD WITHOUT CONTRAST TECHNIQUE: Contiguous axial images were obtained from the base of the skull through the vertex without intravenous contrast. COMPARISON:  CT HEAD July 04, 2017 FINDINGS: BRAIN: No intraparenchymal hemorrhage, mass effect nor midline shift. Mild parenchymal brain volume loss. No hydrocephalus. Confluent supratentorial white matter hypodensities. Old basal ganglia and RIGHT thalamus lacunar infarcts. No acute large vascular territory infarcts. No abnormal extra-axial fluid collections. Basal cisterns are patent. VASCULAR: Moderate calcific atherosclerosis of the carotid siphons. SKULL: No skull fracture. Old RIGHT anterior maxillary wall ORIF. No significant scalp soft tissue swelling. LEFT suboccipital tricholemmal cyst. SINUSES/ORBITS: The  mastoid air-cells and included paranasal sinuses are well-aerated.The included ocular globes and orbital contents are non-suspicious. OTHER: None. IMPRESSION: 1. No acute intracranial process. 2. Severe chronic small vessel ischemic changes and old lacunar infarcts. 3. Mild parenchymal brain volume loss. Electronically Signed   By: Elon Alas M.D.   On: 09/01/2017 05:47    Orson Eva, DO  [facility] Pager [phone]  If 7PM-7AM, please contact night-coverage [email or web address] Password [email or web address] 09/19/2017, 7:38 AM   LOS: 2 days

## 2017-09-20 ENCOUNTER — Emergency Department (HOSPITAL_COMMUNITY)
Admission: EM | Admit: 2017-09-20 | Discharge: 2017-09-20 | Disposition: A | Discharge status: Home / Self Care | Payer: Medicaid Other | Attending: Emergency Medicine | Admitting: Emergency Medicine

## 2017-09-20 ENCOUNTER — Other Ambulatory Visit: Payer: Self-pay

## 2017-09-20 ENCOUNTER — Encounter (HOSPITAL_COMMUNITY): Payer: Self-pay

## 2017-09-20 DIAGNOSIS — F17210 Nicotine dependence, cigarettes, uncomplicated: Secondary | ICD-10-CM | POA: Diagnosis not present

## 2017-09-20 DIAGNOSIS — I130 Hypertensive heart and chronic kidney disease with heart failure and stage 1 through stage 4 chronic kidney disease, or unspecified chronic kidney disease: Secondary | ICD-10-CM | POA: Diagnosis not present

## 2017-09-20 DIAGNOSIS — I5032 Chronic diastolic (congestive) heart failure: Secondary | ICD-10-CM | POA: Diagnosis not present

## 2017-09-20 DIAGNOSIS — N189 Chronic kidney disease, unspecified: Secondary | ICD-10-CM | POA: Insufficient documentation

## 2017-09-20 DIAGNOSIS — R55 Syncope and collapse: Secondary | ICD-10-CM

## 2017-09-20 DIAGNOSIS — J449 Chronic obstructive pulmonary disease, unspecified: Secondary | ICD-10-CM | POA: Diagnosis not present

## 2017-09-20 DIAGNOSIS — R339 Retention of urine, unspecified: Secondary | ICD-10-CM | POA: Diagnosis not present

## 2017-09-20 DIAGNOSIS — T8389XA Other specified complication of genitourinary prosthetic devices, implants and grafts, initial encounter: Secondary | ICD-10-CM | POA: Diagnosis not present

## 2017-09-20 DIAGNOSIS — Z79899 Other long term (current) drug therapy: Secondary | ICD-10-CM | POA: Insufficient documentation

## 2017-09-20 DIAGNOSIS — Y733 Surgical instruments, materials and gastroenterology and urology devices (including sutures) associated with adverse incidents: Secondary | ICD-10-CM | POA: Diagnosis not present

## 2017-09-20 DIAGNOSIS — Z7982 Long term (current) use of aspirin: Secondary | ICD-10-CM | POA: Diagnosis not present

## 2017-09-20 DIAGNOSIS — T839XXA Unspecified complication of genitourinary prosthetic device, implant and graft, initial encounter: Secondary | ICD-10-CM

## 2017-09-20 LAB — CBC WITH DIFFERENTIAL/PLATELET
BASOS ABS: 0.1 10*3/uL (ref 0.0–0.1)
BASOS PCT: 0 %
EOS ABS: 0.3 10*3/uL (ref 0.0–0.7)
EOS PCT: 2 %
HCT: 34.7 % — ABNORMAL LOW (ref 39.0–52.0)
Hemoglobin: 11.1 g/dL — ABNORMAL LOW (ref 13.0–17.0)
Lymphocytes Relative: 13 %
Lymphs Abs: 1.9 10*3/uL (ref 0.7–4.0)
MCH: 26.6 pg (ref 26.0–34.0)
MCHC: 32 g/dL (ref 30.0–36.0)
MCV: 83.2 fL (ref 78.0–100.0)
Monocytes Absolute: 1.2 10*3/uL — ABNORMAL HIGH (ref 0.1–1.0)
Monocytes Relative: 8 %
Neutro Abs: 11.2 10*3/uL — ABNORMAL HIGH (ref 1.7–7.7)
Neutrophils Relative %: 77 %
Platelets: 390 10*3/uL (ref 150–400)
RBC: 4.17 MIL/uL — ABNORMAL LOW (ref 4.22–5.81)
RDW: 14.6 % (ref 11.5–15.5)
WBC: 14.6 10*3/uL — ABNORMAL HIGH (ref 4.0–10.5)

## 2017-09-20 LAB — BASIC METABOLIC PANEL
ANION GAP: 8 (ref 5–15)
BUN: 18 mg/dL (ref 8–23)
CO2: 22 mmol/L (ref 22–32)
Calcium: 9.2 mg/dL (ref 8.9–10.3)
Chloride: 109 mmol/L (ref 98–111)
Creatinine, Ser: 1.17 mg/dL (ref 0.61–1.24)
GFR calc Af Amer: >60 mL/min (ref >60)
GFR calc non Af Amer: >60 mL/min (ref >60)
Glucose, Bld: 123 mg/dL — ABNORMAL HIGH (ref 70–99)
POTASSIUM: 3.5 mmol/L (ref 3.5–5.1)
SODIUM: 139 mmol/L (ref 135–145)

## 2017-09-20 LAB — URINE CULTURE: Culture: >=100000 — AB

## 2017-09-20 NOTE — ED Notes (Signed)
Bladder scan showed 750 mL

## 2017-09-20 NOTE — Discharge Instructions (Addendum)
You were seen today and found to have blocked Foley catheter.  The catheter was replaced.  Monitor for signs and symptoms of infection including fever.  The rest of your work-up is reassuring.  Make sure that you are staying hydrated.

## 2017-09-20 NOTE — ED Provider Notes (Signed)
[facility] EMERGENCY DEPARTMENT Provider Note   CSN: [id number] Arrival date & time: 09/20/17  0010     History   Chief Complaint Chief Complaint  Patient presents with  . Near Syncope    HPI Michael [name] is a 62 y.o. male.  HPI  This is a 62 year old male with a history of syncope, hypertension, urinary retention, recent admission for sepsis associated with with UTI who presents with near syncope.  Patient reports that he was discharged from the hospital early afternoon yesterday.  He had been doing fine at home.  He went to take shower and felt dizzy "like I was going to pass out."  He did not pass out.  Has a history of syncope related to micturition and bowel movements previously.  Some lower abdominal pain which he associates with his Foley catheter.  He states that the catheter has not drained since discharge.  Fevers.  No nausea, vomiting, chest pain, shortness of breath.  Past Medical History:  Diagnosis Date  . Acute urinary retention    followed by OP urology   . Carotid stenosis   . CHF (congestive heart failure) (HCC)    LVEF 50-55% 03/2017  . CKD (chronic kidney disease)    Stage II  . Head and neck cancer ([place]) 2019   s/p right facial basal cell carcinoma s/p resection with right partial mastectomy and partal rhinectomy with skin graft (06/13/17)  . HTN (hypertension)   . Iron deficiency anemia   . Tobacco abuse     Patient Active Problem List   Diagnosis Date Noted  . Complicated UTI (urinary tract infection)   . Sepsis ([place]) 09/17/2017  . Vasovagal syncope 09/17/2017  . Sepsis secondary to UTI ([place]) 09/01/2017  . Syncope 07/05/2017  . COPD exacerbation ([place]) 07/05/2017  . Acute renal failure superimposed on chronic kidney disease ([place]) 07/05/2017  . Catheter-associated urinary tract infection ([place]) 07/05/2017  . Leukocytosis 07/05/2017  . Urinary retention with incomplete bladder emptying 07/05/2017  . UTI (urinary tract infection) 07/05/2017  .  Diarrhea 07/05/2017  . Carotid artery disease ([place]) 06/25/2017  . AKI (acute kidney injury) ([place])   . Orthostatic syncope   . Syncope due to orthostatic hypotension 06/24/2017  . Heart murmur 06/24/2017  . Abnormal EKG 06/24/2017  . Abnormal PET scan of colon 05/25/2017  . 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 Periapical abscess 04/04/2017  . Iron (Fe) deficiency anemia 04/04/2017  . Essential hypertension 04/02/2017  . Anemia 04/02/2017  . Acute hypoxemic respiratory failure ([place]) 04/02/2017  . Tobacco abuse 04/02/2017  . Chronic diastolic CHF (congestive heart failure) ([place]) 04/02/2017  . Elevated troponin 04/02/2017  . Thrombocytosis ([place]) 04/02/2017  . Skin lesion of face   . Nasal lesion     Past Surgical History:  Procedure Laterality Date  . ANKLE CLOSED REDUCTION Right   . COLONOSCOPY N/A 05/31/2017   Procedure: COLONOSCOPY;  Surgeon: Daneil Dolin, MD;  [facility];  Service: Endoscopy;  Laterality: N/A;  2:45pm  . POLYPECTOMY  05/31/2017   Procedure: POLYPECTOMY;  Surgeon: Daneil Dolin, MD;  Location: [facility] ENDO [facility];  Service: Endoscopy;;  . TONSILLECTOMY          Home Medications    Prior to Admission medications   Medication Sig Start Date End Date Taking? Authorizing Provider  amLODipine (NORVASC) 5 MG tablet Take 1 tablet (5 mg total) by mouth daily. 07/07/17   Mendel Corning, MD  aspirin EC  81 MG tablet Take 81 mg by mouth daily.    [provider]  atorvastatin (LIPITOR) 40 MG tablet Take 1 tablet (40 mg total) by mouth daily at 6 PM. 05/23/17   Derek Jack, MD  carvedilol (COREG) 25 MG tablet Take 1 tablet (25 mg total) by mouth 2 (two) times daily with a meal. 09/19/17   Tat, Shanon Brow, MD  cefdinir (OMNICEF) 300 MG capsule Take 1 capsule (300 mg total) by mouth every 12 (twelve) hours. 09/19/17   Orson Eva, MD  ferrous sulfate 325 (65 FE) MG tablet Take 1 tablet (325 mg total) by mouth daily with breakfast.  09/19/17   Tat, Shanon Brow, MD  finasteride (PROSCAR) 5 MG tablet Take 1 tablet (5 mg total) by mouth daily. 09/03/17   Manuella Ghazi, Pratik D, DO  furosemide (LASIX) 40 MG tablet Take 40 mg by mouth daily.    [provider]    Family History Family History  Problem Relation Age of Onset  . Stroke Father   . Cirrhosis Mother   . Colon cancer Neg Hx     Social History Social History   Tobacco Use  . Smoking status: Current Some Day Smoker    Packs/day: 0.10    Years: 40.00    Pack years: 4.00    Types: Cigarettes  . Smokeless tobacco: Never Used  Substance Use Topics  . Alcohol use: Not Currently    Frequency: Never  . Drug use: Never     Allergies   Patient has no known allergies.   Review of Systems Review of Systems  Constitutional: Negative for fever.  Respiratory: Negative for shortness of breath.   Cardiovascular: Negative for chest pain.  Gastrointestinal: Positive for abdominal pain. Negative for nausea and vomiting.  Genitourinary: Positive for difficulty urinating.  Musculoskeletal: Negative for gait problem.  Neurological: Positive for dizziness. Negative for weakness, numbness and headaches.  All other systems reviewed and are negative.    Physical Exam Updated Vital Signs BP (!) 183/153   Pulse 95   Temp (!) 97.4 F (36.3 C) (Oral)   Resp 17   Ht 6' (1.829 m)   Wt 76.3 kg   SpO2 98%   BMI 22.81 kg/m   Physical Exam  Constitutional: He is oriented to person, place, and time.  Chronically ill-appearing, nontoxic  HENT:  Head: Normocephalic and atraumatic.  Graft noted right nasal bridge and face  Eyes: Pupils are equal, round, and reactive to light.  Neck: Neck supple.  Cardiovascular: Normal rate, regular rhythm and normal heart sounds.  No murmur heard. Pulmonary/Chest: Effort normal and breath sounds normal. No respiratory distress. He has no wheezes.  Abdominal: Soft. Bowel sounds are normal. There is tenderness. There is no rebound.    Suprapubic tenderness to palpation, no rebound or guarding  Genitourinary:  Genitourinary Comments: Foley catheter in place  Musculoskeletal: He exhibits no edema.  Neurological: He is alert and oriented to person, place, and time.  5 out of 5 strength in all 4 extremities, no dysmetria to finger-nose-finger, no drift  Skin: Skin is warm and dry.  Psychiatric: He has a normal mood and affect.  Nursing note and vitals reviewed.    ED Treatments / Results  Labs (all labs ordered are listed, but only abnormal results are displayed) Labs Reviewed  CBC WITH DIFFERENTIAL/PLATELET - Abnormal; Notable for the following components:      Result Value   WBC 14.6 (*)    RBC 4.17 (*)  Hemoglobin 11.1 (*)    HCT 34.7 (*)    Neutro Abs 11.2 (*)    Monocytes Absolute 1.2 (*)    All other components within normal limits  BASIC METABOLIC PANEL - Abnormal; Notable for the following components:   Glucose, Bld 123 (*)    All other components within normal limits    EKG EKG Interpretation  Date/Time:  Wednesday September 20 2017 00:30:56 EDT Ventricular Rate:  102 PR Interval:    QRS Duration: 110 QT Interval:  366 QTC Calculation: 477 R Axis:   -27 Text Interpretation:  Normal sinus rhythm Ventricular premature complex LVH with secondary repolarization abnormality Anterior infarct, old No significant change since last tracing Confirmed by Thayer Jew [phone]) on 09/20/2017 12:35:19 AM   Radiology No results found.  Procedures Procedures (including critical care time)  Medications Ordered in ED Medications - No data to display   Initial Impression / Assessment and Plan / ED Course  I have reviewed the triage vital signs and the nursing notes.  Pertinent labs & imaging results that were available during my care of the patient were reviewed by me and considered in my medical decision making (see chart for details).  Clinical Course as of Sep 21 226  Wed Sep 20, 2017  0200 Per  nursing, Foley with clot.  Foley was replaced and approximately 700 cc of urine was evacuated from the bladder.  Patient reports improvement of pain.  Pressure has improved.   [CH]    Clinical Course User Index [CH] Corbett Moulder, Barbette Hair, MD    Patient presents with an episode of near syncope and lower abdominal pressure which he relates to a blocked Foley catheter.  Reports no drainage.  He is overall nontoxic-appearing.  Initial blood pressure notably elevated.  He is afebrile.  Neurologic exam is reassuring.  EKG without signs of arrhythmia.  Orthostatics negative.  Basic lab work obtained.  White count is trending back upper; however, no evidence of sepsis or current infection.  Patient is on antibiotics at home.  Foley was noted to be clogged.  Good urine output with catheter change.  Patient had significant improvement of symptoms.  Suspect that his near syncope/dizziness is likely related to pain and vasovagal episode.  After history, exam, and medical workup I feel the patient has been appropriately medically screened and is safe for discharge home. Pertinent diagnoses were discussed with the patient. Patient was given return precautions.   Final Clinical Impressions(s) / ED Diagnoses   Final diagnoses:  Foley catheter problem, initial encounter [facility])  Near syncope    ED Discharge Orders    None       Katera Rybka, Barbette Hair, MD 09/20/17 [phone]

## 2017-09-20 NOTE — ED Triage Notes (Signed)
Pt called [facility] due to syncope exiting the shower today around 2230, has a hx of HTN and urinary rentention/problems related to foley.

## 2017-09-22 LAB — CULTURE, BLOOD (ROUTINE X 2)
Culture: NO GROWTH
Culture: NO GROWTH
Special Requests: ADEQUATE
Special Requests: ADEQUATE

## 2017-09-25 ENCOUNTER — Other Ambulatory Visit: Payer: Self-pay

## 2017-09-25 ENCOUNTER — Emergency Department (HOSPITAL_COMMUNITY)
Admission: EM | Admit: 2017-09-25 | Discharge: 2017-09-25 | Disposition: A | Discharge status: Home / Self Care | Payer: Medicaid Other | Attending: Emergency Medicine | Admitting: Emergency Medicine

## 2017-09-25 ENCOUNTER — Encounter (HOSPITAL_COMMUNITY): Payer: Self-pay

## 2017-09-25 DIAGNOSIS — I130 Hypertensive heart and chronic kidney disease with heart failure and stage 1 through stage 4 chronic kidney disease, or unspecified chronic kidney disease: Secondary | ICD-10-CM | POA: Insufficient documentation

## 2017-09-25 DIAGNOSIS — N182 Chronic kidney disease, stage 2 (mild): Secondary | ICD-10-CM | POA: Insufficient documentation

## 2017-09-25 DIAGNOSIS — R339 Retention of urine, unspecified: Secondary | ICD-10-CM | POA: Insufficient documentation

## 2017-09-25 DIAGNOSIS — F17210 Nicotine dependence, cigarettes, uncomplicated: Secondary | ICD-10-CM | POA: Insufficient documentation

## 2017-09-25 DIAGNOSIS — Z79899 Other long term (current) drug therapy: Secondary | ICD-10-CM | POA: Insufficient documentation

## 2017-09-25 DIAGNOSIS — I5032 Chronic diastolic (congestive) heart failure: Secondary | ICD-10-CM | POA: Insufficient documentation

## 2017-09-25 DIAGNOSIS — T83091A Other mechanical complication of indwelling urethral catheter, initial encounter: Secondary | ICD-10-CM

## 2017-09-25 DIAGNOSIS — Y658 Other specified misadventures during surgical and medical care: Secondary | ICD-10-CM | POA: Diagnosis not present

## 2017-09-25 DIAGNOSIS — T83098A Other mechanical complication of other indwelling urethral catheter, initial encounter: Secondary | ICD-10-CM | POA: Insufficient documentation

## 2017-09-25 DIAGNOSIS — I10 Essential (primary) hypertension: Secondary | ICD-10-CM

## 2017-09-25 DIAGNOSIS — Z7982 Long term (current) use of aspirin: Secondary | ICD-10-CM | POA: Insufficient documentation

## 2017-09-25 NOTE — ED Provider Notes (Signed)
[facility] EMERGENCY DEPARTMENT Provider Note   CSN: [id number] Arrival date & time: 09/25/17  0435     History   Chief Complaint Chief Complaint  Patient presents with  . Urinary Retention    dysuria    HPI Michael [name] is a 62 y.o. male.  The history is provided by the patient.  He has history of hypertension, diastolic heart failure, indwelling Foley catheter and comes in with his catheter not draining since 2 PM.  He states that the catheter had been changed to 3 days ago.  He has required a catheter for the last 2 months.  He denies fever or chills.  He is complaining of pain due to urinary retention.  Past Medical History:  Diagnosis Date  . Acute urinary retention    followed by OP urology   . Carotid stenosis   . CHF (congestive heart failure) (HCC)    LVEF 50-55% 03/2017  . CKD (chronic kidney disease)    Stage II  . Head and neck cancer ([place]) 2019   s/p right facial basal cell carcinoma s/p resection with right partial mastectomy and partal rhinectomy with skin graft (06/13/17)  . HTN (hypertension)   . Iron deficiency anemia   . Tobacco abuse     Patient Active Problem List   Diagnosis Date Noted  . Complicated UTI (urinary tract infection)   . Sepsis ([place]) 09/17/2017  . Vasovagal syncope 09/17/2017  . Sepsis secondary to UTI ([place]) 09/01/2017  . Syncope 07/05/2017  . COPD exacerbation ([place]) 07/05/2017  . Acute renal failure superimposed on chronic kidney disease ([place]) 07/05/2017  . Catheter-associated urinary tract infection ([place]) 07/05/2017  . Leukocytosis 07/05/2017  . Urinary retention with incomplete bladder emptying 07/05/2017  . UTI (urinary tract infection) 07/05/2017  . Diarrhea 07/05/2017  . Carotid artery disease ([place]) 06/25/2017  . AKI (acute kidney injury) ([place])   . Orthostatic syncope   . Syncope due to orthostatic hypotension 06/24/2017  . Heart murmur 06/24/2017  . Abnormal EKG 06/24/2017  . Abnormal PET scan of colon 05/25/2017    . 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 Periapical abscess 04/04/2017  . Iron (Fe) deficiency anemia 04/04/2017  . Essential hypertension 04/02/2017  . Anemia 04/02/2017  . Acute hypoxemic respiratory failure ([place]) 04/02/2017  . Tobacco abuse 04/02/2017  . Chronic diastolic CHF (congestive heart failure) ([place]) 04/02/2017  . Elevated troponin 04/02/2017  . Thrombocytosis ([place]) 04/02/2017  . Skin lesion of face   . Nasal lesion     Past Surgical History:  Procedure Laterality Date  . ANKLE CLOSED REDUCTION Right   . COLONOSCOPY N/A 05/31/2017   Procedure: COLONOSCOPY;  Surgeon: Daneil Dolin, MD;  [facility];  Service: Endoscopy;  Laterality: N/A;  2:45pm  . POLYPECTOMY  05/31/2017   Procedure: POLYPECTOMY;  Surgeon: Daneil Dolin, MD;  Location: [facility];  Service: Endoscopy;;  . TONSILLECTOMY          Home Medications    Prior to Admission medications   Medication Sig Start Date End Date Taking? Authorizing Provider  amLODipine (NORVASC) 5 MG tablet Take 1 tablet (5 mg total) by mouth daily. 07/07/17   Rai, Vernelle Emerald, MD  aspirin EC 81 MG tablet Take 81 mg by mouth daily.    [provider]  atorvastatin (LIPITOR) 40 MG tablet Take 1 tablet (40 mg total) by mouth daily at 6 PM. 05/23/17   Derek Jack, MD  carvedilol (COREG) 25  MG tablet Take 1 tablet (25 mg total) by mouth 2 (two) times daily with a meal. 09/19/17   Tat, Shanon Brow, MD  cefdinir (OMNICEF) 300 MG capsule Take 1 capsule (300 mg total) by mouth every 12 (twelve) hours. 09/19/17   Orson Eva, MD  ferrous sulfate 325 (65 FE) MG tablet Take 1 tablet (325 mg total) by mouth daily with breakfast. 09/19/17   Tat, Shanon Brow, MD  finasteride (PROSCAR) 5 MG tablet Take 1 tablet (5 mg total) by mouth daily. 09/03/17   Manuella Ghazi, Pratik D, DO  furosemide (LASIX) 40 MG tablet Take 40 mg by mouth daily.    [provider]    Family History Family History  Problem Relation  Age of Onset  . Stroke Father   . Cirrhosis Mother   . Colon cancer Neg Hx     Social History Social History   Tobacco Use  . Smoking status: Current Some Day Smoker    Packs/day: 0.10    Years: 40.00    Pack years: 4.00    Types: Cigarettes  . Smokeless tobacco: Never Used  Substance Use Topics  . Alcohol use: Not Currently    Frequency: Never  . Drug use: Never     Allergies   Patient has no known allergies.   Review of Systems Review of Systems  All other systems reviewed and are negative.    Physical Exam Updated Vital Signs BP (!) 204/107 (BP Location: Right Arm) Comment: Simultaneous filing. User may not have seen previous data.  Pulse 95   Temp 98.1 F (36.7 C) (Axillary)   Ht 6' (1.829 m)   Wt 76.3 kg   SpO2 98%   BMI 22.81 kg/m   Physical Exam  Nursing note and vitals reviewed.  62 year old male, resting comfortably and in no acute distress. Vital signs are significant for elevated blood pressure. Oxygen saturation is 98%, which is normal. Head is normocephalic and atraumatic. PERRLA, EOMI. Oropharynx is clear. Neck is nontender and supple without adenopathy or JVD. Back is nontender and there is no CVA tenderness. Lungs are clear without rales, wheezes, or rhonchi. Chest is nontender. Heart has regular rate and rhythm without murmur. Abdomen is soft, flat, with distended bladder extending two thirds the way up to the umbilicus.  There are no other masses or hepatosplenomegaly and peristalsis is normoactive. Extremities have no cyanosis or edema, full range of motion is present. Skin is warm and dry without rash. Neurologic: Mental status is normal, cranial nerves are intact, there are no motor or sensory deficits.  ED Treatments / Results  Labs (all labs ordered are listed, but only abnormal results are displayed) Labs Reviewed - No data to display  EKG None  Radiology No results found.  Procedures Procedures   Medications Ordered in  ED Medications - No data to display   Initial Impression / Assessment and Plan / ED Course  I have reviewed the triage vital signs and the nursing notes.  Pertinent labs & imaging results that were available during my care of the patient were reviewed by me and considered in my medical decision making (see chart for details).  Acute urinary retention secondary to occluded Foley catheter.  Catheter is removed and new catheter is placed.  Old records are reviewed, and he has several other ED visits for Foley catheter problems, most recently August 21.  Foley catheter has been placed and has drained 1000 milliliters of urine.  Blood pressure is still elevated, but  significantly decreased from on arrival to the ED.  He is discharged with instructions to follow-up with his urologist.  Final Clinical Impressions(s) / ED Diagnoses   Final diagnoses:  Obstructed Foley catheter, initial encounter ([place])  Elevated blood pressure reading with diagnosis of hypertension    ED Discharge Orders    None       Delora Fuel, MD 84/83/50 [phone]

## 2017-09-25 NOTE — ED Triage Notes (Signed)
Pt reports urinary retention. Pt has in-dwelling foley catheter.  Bladder scan performed upon arrival, 694 ml urine in bladder. Pt is non-compliant with BP medication. Pt reports dysuria when he does urinate. Pt reports the last time he noticed urine draining was about 2 pm yesterday.

## 2017-09-26 ENCOUNTER — Emergency Department (HOSPITAL_COMMUNITY)
Admission: EM | Admit: 2017-09-26 | Discharge: 2017-09-26 | Disposition: A | Discharge status: Home / Self Care | Payer: Medicaid Other | Attending: Emergency Medicine | Admitting: Emergency Medicine

## 2017-09-26 ENCOUNTER — Encounter (HOSPITAL_COMMUNITY): Payer: Self-pay | Admitting: *Deleted

## 2017-09-26 DIAGNOSIS — F17210 Nicotine dependence, cigarettes, uncomplicated: Secondary | ICD-10-CM | POA: Diagnosis not present

## 2017-09-26 DIAGNOSIS — T839XXD Unspecified complication of genitourinary prosthetic device, implant and graft, subsequent encounter: Secondary | ICD-10-CM

## 2017-09-26 DIAGNOSIS — N182 Chronic kidney disease, stage 2 (mild): Secondary | ICD-10-CM | POA: Diagnosis not present

## 2017-09-26 DIAGNOSIS — T839XXA Unspecified complication of genitourinary prosthetic device, implant and graft, initial encounter: Secondary | ICD-10-CM | POA: Insufficient documentation

## 2017-09-26 DIAGNOSIS — Y69 Unspecified misadventure during surgical and medical care: Secondary | ICD-10-CM | POA: Diagnosis not present

## 2017-09-26 DIAGNOSIS — I5032 Chronic diastolic (congestive) heart failure: Secondary | ICD-10-CM | POA: Diagnosis not present

## 2017-09-26 DIAGNOSIS — J449 Chronic obstructive pulmonary disease, unspecified: Secondary | ICD-10-CM | POA: Diagnosis not present

## 2017-09-26 DIAGNOSIS — I130 Hypertensive heart and chronic kidney disease with heart failure and stage 1 through stage 4 chronic kidney disease, or unspecified chronic kidney disease: Secondary | ICD-10-CM | POA: Insufficient documentation

## 2017-09-26 NOTE — ED Provider Notes (Signed)
Emergency Department Provider Note   I have reviewed the triage vital signs and the nursing notes.   HISTORY  Chief Complaint foley cath. leaking   HPI Michael [name] is a 62 y.o. male who has an indwelling Foley catheter that just had a replaced yesterday secondary to it being clogged.  It seems to be working appropriately be leaking urine around it.  No other symptoms. No other associated or modifying symptoms.    Past Medical History:  Diagnosis Date  . Acute urinary retention    followed by OP urology   . Carotid stenosis   . CHF (congestive heart failure) (HCC)    LVEF 50-55% 03/2017  . CKD (chronic kidney disease)    Stage II  . Head and neck cancer ([place]) 2019   s/p right facial basal cell carcinoma s/p resection with right partial mastectomy and partal rhinectomy with skin graft (06/13/17)  . HTN (hypertension)   . Iron deficiency anemia   . Tobacco abuse     Patient Active Problem List   Diagnosis Date Noted  . Complicated UTI (urinary tract infection)   . Sepsis ([place]) 09/17/2017  . Vasovagal syncope 09/17/2017  . Sepsis secondary to UTI ([place]) 09/01/2017  . Syncope 07/05/2017  . COPD exacerbation ([place]) 07/05/2017  . Acute renal failure superimposed on chronic kidney disease ([place]) 07/05/2017  . Catheter-associated urinary tract infection ([place]) 07/05/2017  . Leukocytosis 07/05/2017  . Urinary retention with incomplete bladder emptying 07/05/2017  . UTI (urinary tract infection) 07/05/2017  . Diarrhea 07/05/2017  . Carotid artery disease ([place]) 06/25/2017  . AKI (acute kidney injury) ([place])   . Orthostatic syncope   . Syncope due to orthostatic hypotension 06/24/2017  . Heart murmur 06/24/2017  . Abnormal EKG 06/24/2017  . Abnormal PET scan of colon 05/25/2017  . 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 Periapical abscess 04/04/2017  . Iron (Fe) deficiency anemia 04/04/2017  . Essential hypertension 04/02/2017  . Anemia 04/02/2017    . Acute hypoxemic respiratory failure ([place]) 04/02/2017  . Tobacco abuse 04/02/2017  . Chronic diastolic CHF (congestive heart failure) ([place]) 04/02/2017  . Elevated troponin 04/02/2017  . Thrombocytosis ([place]) 04/02/2017  . Skin lesion of face   . Nasal lesion     Past Surgical History:  Procedure Laterality Date  . ANKLE CLOSED REDUCTION Right   . COLONOSCOPY N/A 05/31/2017   Procedure: COLONOSCOPY;  Surgeon: Daneil Dolin, MD;  [facility];  Service: Endoscopy;  Laterality: N/A;  2:45pm  . POLYPECTOMY  05/31/2017   Procedure: POLYPECTOMY;  Surgeon: Daneil Dolin, MD;  [facility];  Service: Endoscopy;;  . TONSILLECTOMY      Current Outpatient Rx  . Order #: [id number] Class: Print  . Order #: [id number] Class: Historical Med  . Order #: [id number] Class: Normal  . Order #: [id number] Class: Normal  . Order #: [id number] Class: Normal  . Order #: [id number] Class: OTC  . Order #: [id number] Class: Normal  . Order #: [id number] Class: Historical Med    Allergies Patient has no known allergies.  Family History  Problem Relation Age of Onset  . Stroke Father   . Cirrhosis Mother   . Colon cancer Neg Hx     Social History Social History   Tobacco Use  . Smoking status: Current Some Day Smoker    Packs/day: 0.10    Years: 40.00    Pack years: 4.00    Types: Cigarettes  . Smokeless tobacco: Never Used  Substance  Use Topics  . Alcohol use: Not Currently    Frequency: Never  . Drug use: Never    Review of Systems  All other systems negative except as documented in the HPI. All pertinent positives and negatives as reviewed in the HPI. ____________________________________________   PHYSICAL EXAM:  VITAL SIGNS: ED Triage Vitals  Enc Vitals Group     BP 09/26/17 1238 (!) 222/106     Pulse Rate 09/26/17 1238 91     Resp 09/26/17 1238 18     Temp 09/26/17 1238 97.7 F (36.5 C)     Temp Source 09/26/17 1238 Oral     SpO2 09/26/17 1238 97 %      Weight 09/26/17 1235 163 lb (73.9 kg)     Height 09/26/17 1235 6' (1.829 m)     Head Circumference --      Peak Flow --      Pain Score 09/26/17 1235 6     Pain Loc --      Pain Edu? --      Excl. in [place]? --     Constitutional: Alert and oriented. Well appearing and in no acute distress. Eyes: Conjunctivae are normal. PERRL. EOMI. Head: Atraumatic. Nose: No congestion/rhinnorhea. Mouth/Throat: Mucous membranes are moist.  Oropharynx non-erythematous. Neck: No stridor.  No meningeal signs.   Cardiovascular: Normal rate, regular rhythm. Good peripheral circulation. Grossly normal heart sounds.   Respiratory: Normal respiratory effort.  No retractions. Lungs CTAB. Gastrointestinal: Soft and nontender. No distention.  Musculoskeletal: No lower extremity tenderness nor edema. No gross deformities of extremities. Neurologic:  Normal speech and language. No gross focal neurologic deficits are appreciated.  Skin:  Skin is warm, dry and intact. No rash noted. GU: No irritation, rash or bleeding ____________________________________________   INITIAL IMPRESSION / ASSESSMENT AND PLAN / ED COURSE  Removed 8 cc of fluid from the balloon and refill the 10 cc.  Seated at a little bit better and attached to the leg a little bit more tension.  Observed for over an hour without any leakage.  Stable for discharge.    Pertinent labs & imaging results that were available during my care of the patient were reviewed by me and considered in my medical decision making (see chart for details).  ____________________________________________  FINAL CLINICAL IMPRESSION(S) / ED DIAGNOSES  Final diagnoses:  Problem with Foley catheter, subsequent encounter     MEDICATIONS GIVEN DURING THIS VISIT:  Medications - No data to display   NEW OUTPATIENT MEDICATIONS STARTED DURING THIS VISIT:  New Prescriptions   No medications on file    Note:  This note was prepared with assistance of Dragon voice  recognition software. Occasional wrong-word or sound-a-like substitutions may have occurred due to the inherent limitations of voice recognition software.   Merrily Pew, MD 09/26/17 1530

## 2017-09-26 NOTE — ED Triage Notes (Signed)
Pt with leaking at site of insertion of foley catheter, was changed yesterday morning.  Pt states some urine in the urine bag.  Pt admits to some discomfort due to foley.

## 2017-10-01 ENCOUNTER — Emergency Department (HOSPITAL_COMMUNITY)
Admission: EM | Admit: 2017-10-01 | Discharge: 2017-10-02 | Disposition: A | Discharge status: Home / Self Care | Payer: Medicaid Other | Source: Home / Self Care | Attending: Emergency Medicine | Admitting: Emergency Medicine

## 2017-10-01 ENCOUNTER — Encounter (HOSPITAL_COMMUNITY): Payer: Self-pay | Admitting: *Deleted

## 2017-10-01 ENCOUNTER — Other Ambulatory Visit: Payer: Self-pay

## 2017-10-01 ENCOUNTER — Emergency Department (HOSPITAL_COMMUNITY)
Admission: EM | Admit: 2017-10-01 | Discharge: 2017-10-01 | Disposition: A | Discharge status: Home / Self Care | Payer: Medicaid Other | Attending: Emergency Medicine | Admitting: Emergency Medicine

## 2017-10-01 ENCOUNTER — Emergency Department (HOSPITAL_COMMUNITY): Payer: Medicaid Other

## 2017-10-01 DIAGNOSIS — R55 Syncope and collapse: Secondary | ICD-10-CM | POA: Diagnosis not present

## 2017-10-01 DIAGNOSIS — J449 Chronic obstructive pulmonary disease, unspecified: Secondary | ICD-10-CM | POA: Insufficient documentation

## 2017-10-01 DIAGNOSIS — F17210 Nicotine dependence, cigarettes, uncomplicated: Secondary | ICD-10-CM | POA: Insufficient documentation

## 2017-10-01 DIAGNOSIS — N182 Chronic kidney disease, stage 2 (mild): Secondary | ICD-10-CM | POA: Insufficient documentation

## 2017-10-01 DIAGNOSIS — T839XXD Unspecified complication of genitourinary prosthetic device, implant and graft, subsequent encounter: Secondary | ICD-10-CM

## 2017-10-01 DIAGNOSIS — Z85828 Personal history of other malignant neoplasm of skin: Secondary | ICD-10-CM

## 2017-10-01 DIAGNOSIS — R9431 Abnormal electrocardiogram [ECG] [EKG]: Secondary | ICD-10-CM | POA: Diagnosis not present

## 2017-10-01 DIAGNOSIS — I130 Hypertensive heart and chronic kidney disease with heart failure and stage 1 through stage 4 chronic kidney disease, or unspecified chronic kidney disease: Secondary | ICD-10-CM

## 2017-10-01 DIAGNOSIS — R42 Dizziness and giddiness: Secondary | ICD-10-CM

## 2017-10-01 DIAGNOSIS — I5032 Chronic diastolic (congestive) heart failure: Secondary | ICD-10-CM | POA: Insufficient documentation

## 2017-10-01 DIAGNOSIS — R339 Retention of urine, unspecified: Secondary | ICD-10-CM

## 2017-10-01 DIAGNOSIS — Y829 Unspecified medical devices associated with adverse incidents: Secondary | ICD-10-CM | POA: Insufficient documentation

## 2017-10-01 DIAGNOSIS — Z7982 Long term (current) use of aspirin: Secondary | ICD-10-CM

## 2017-10-01 DIAGNOSIS — T83091A Other mechanical complication of indwelling urethral catheter, initial encounter: Secondary | ICD-10-CM | POA: Insufficient documentation

## 2017-10-01 DIAGNOSIS — I2510 Atherosclerotic heart disease of native coronary artery without angina pectoris: Secondary | ICD-10-CM

## 2017-10-01 DIAGNOSIS — Z79899 Other long term (current) drug therapy: Secondary | ICD-10-CM

## 2017-10-01 LAB — TROPONIN I
TROPONIN I: 0.03 ng/mL — AB (ref <0.03)
Troponin I: 0.04 ng/mL (ref <0.03)

## 2017-10-01 LAB — CBC WITH DIFFERENTIAL/PLATELET
BASOS ABS: 0.1 10*3/uL (ref 0.0–0.1)
Basophils Relative: 1 %
Eosinophils Absolute: 0.2 10*3/uL (ref 0.0–0.7)
Eosinophils Relative: 2 %
HCT: 32.6 % — ABNORMAL LOW (ref 39.0–52.0)
HEMOGLOBIN: 10.2 g/dL — AB (ref 13.0–17.0)
LYMPHS ABS: 1.4 10*3/uL (ref 0.7–4.0)
LYMPHS PCT: 13 %
MCH: 25.6 pg — AB (ref 26.0–34.0)
MCHC: 31.3 g/dL (ref 30.0–36.0)
MCV: 81.7 fL (ref 78.0–100.0)
Monocytes Absolute: 0.7 10*3/uL (ref 0.1–1.0)
Monocytes Relative: 6 %
NEUTROS ABS: 8.5 10*3/uL — AB (ref 1.7–7.7)
NEUTROS PCT: 78 %
PLATELETS: 347 10*3/uL (ref 150–400)
RBC: 3.99 MIL/uL — AB (ref 4.22–5.81)
RDW: 14.1 % (ref 11.5–15.5)
WBC: 10.8 10*3/uL — ABNORMAL HIGH (ref 4.0–10.5)

## 2017-10-01 LAB — BASIC METABOLIC PANEL
Anion gap: 8 (ref 5–15)
BUN: 17 mg/dL (ref 8–23)
CALCIUM: 8.8 mg/dL — AB (ref 8.9–10.3)
CO2: 27 mmol/L (ref 22–32)
CREATININE: 1.34 mg/dL — AB (ref 0.61–1.24)
Chloride: 106 mmol/L (ref 98–111)
GFR calc Af Amer: >60 mL/min (ref >60)
GFR calc non Af Amer: 55 mL/min — ABNORMAL LOW (ref >60)
GLUCOSE: 121 mg/dL — AB (ref 70–99)
Potassium: 3.1 mmol/L — ABNORMAL LOW (ref 3.5–5.1)
Sodium: 141 mmol/L (ref 135–145)

## 2017-10-01 LAB — BRAIN NATRIURETIC PEPTIDE: B Natriuretic Peptide: 455 pg/mL — ABNORMAL HIGH (ref 0.0–100.0)

## 2017-10-01 LAB — CBG MONITORING, ED: Glucose-Capillary: 113 mg/dL — ABNORMAL HIGH (ref 70–99)

## 2017-10-01 MED ORDER — POTASSIUM CHLORIDE CRYS ER 20 MEQ PO TBCR
40.0000 meq | EXTENDED_RELEASE_TABLET | Freq: Once | ORAL | Status: AC
  Administered 2017-10-01: 40 meq via ORAL
  Filled 2017-10-01: qty 2

## 2017-10-01 MED ORDER — LIDOCAINE HCL URETHRAL/MUCOSAL 2 % EX GEL: 1.0000 "application " | Freq: Once | CUTANEOUS | Status: DC

## 2017-10-01 MED ORDER — SODIUM CHLORIDE 0.9 % IV BOLUS
500.0000 mL | Freq: Once | INTRAVENOUS | Status: AC
  Administered 2017-10-01: 500 mL via INTRAVENOUS

## 2017-10-01 MED ORDER — ONDANSETRON HCL 4 MG/2ML IJ SOLN
4.0000 mg | Freq: Once | INTRAMUSCULAR | Status: AC
  Administered 2017-10-01: 4 mg via INTRAVENOUS
  Filled 2017-10-01: qty 2

## 2017-10-01 NOTE — ED Notes (Signed)
20 french foley placed with no problems no resistance met at insertion, pt tolerated well, states that he is feeling better after procedure,

## 2017-10-01 NOTE — Progress Notes (Signed)
Discussed with Dr. Melina Copa.  Another episode of syncope suggestive of orthostatic hypotension.  I suggested reducing the dose of amlodipine to only 2.5 mg once daily, continuing his carvedilol. The patient indeed has changes on the electrocardiogram that deserve further evaluation.   Note the presence of LVH on his echocardiogram which can produce similar ECG abnormalities, although the dynamic nature of the ECG would be more consistent with ischemia.  Note from my partners previous notes that there has been consideration for a nuclear perfusion study.  I will try to organize this as soon as possible as an outpatient and then follow-up in clinic.  I do not think the EKG changes (in the absence of angina or abnormal cardiac enzymes) warrant immediate inpatient work-up at this time. Sanda Klein, MD, [facility] [phone] office [phone] pager 10/01/2017

## 2017-10-01 NOTE — ED Triage Notes (Signed)
Syncopal episode in bathroom at home.  No reported fall per patient.  EMS, CBG 142.  Fluids given by EMS.  20 g in right AC.

## 2017-10-01 NOTE — ED Provider Notes (Signed)
[facility] EMERGENCY DEPARTMENT Provider Note   CSN: [id number] Arrival date & time: 10/01/17  2154     History   Chief Complaint Chief Complaint  Patient presents with  . Urinary Retention    HPI Michael [name] is a 62 y.o. male.  HPI   Michael [name] is a 62 y.o. male who presents to the Emergency Department complaining of urinary retention.  Patient was here earlier today after having a syncopal event at home.  He was evaluated for his symptoms and discharged home.  He states this evening around 6 PM he developed pressure to urinate.  He has had a Foley catheter placed for 1 week.  He is scheduled to see urology later this month.  He complains of sharp pain and pressure to his lower abdomen and a sensation to void.  He asked on his earlier visit if his Foley could be replaced with a larger size, but changed his mind as he wasn't having symptoms.     Past Medical History:  Diagnosis Date  . Acute urinary retention    followed by OP urology   . Carotid stenosis   . CHF (congestive heart failure) (HCC)    LVEF 50-55% 03/2017  . CKD (chronic kidney disease)    Stage II  . Head and neck cancer ([place]) 2019   s/p right facial basal cell carcinoma s/p resection with right partial mastectomy and partal rhinectomy with skin graft (06/13/17)  . HTN (hypertension)   . Iron deficiency anemia   . Tobacco abuse     Patient Active Problem List   Diagnosis Date Noted  . Complicated UTI (urinary tract infection)   . Sepsis ([place]) 09/17/2017  . Vasovagal syncope 09/17/2017  . Sepsis secondary to UTI ([place]) 09/01/2017  . Syncope 07/05/2017  . COPD exacerbation ([place]) 07/05/2017  . Acute renal failure superimposed on chronic kidney disease ([place]) 07/05/2017  . Catheter-associated urinary tract infection ([place]) 07/05/2017  . Leukocytosis 07/05/2017  . Urinary retention with incomplete bladder emptying 07/05/2017  . UTI (urinary tract infection) 07/05/2017  . Diarrhea 07/05/2017  . Carotid  artery disease ([place]) 06/25/2017  . AKI (acute kidney injury) ([place])   . Orthostatic syncope   . Syncope due to orthostatic hypotension 06/24/2017  . Heart murmur 06/24/2017  . Abnormal EKG 06/24/2017  . Abnormal PET scan of colon 05/25/2017  . 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 Periapical abscess 04/04/2017  . Iron (Fe) deficiency anemia 04/04/2017  . Essential hypertension 04/02/2017  . Anemia 04/02/2017  . Acute hypoxemic respiratory failure ([place]) 04/02/2017  . Tobacco abuse 04/02/2017  . Chronic diastolic CHF (congestive heart failure) ([place]) 04/02/2017  . Elevated troponin 04/02/2017  . Thrombocytosis ([place]) 04/02/2017  . Skin lesion of face   . Nasal lesion     Past Surgical History:  Procedure Laterality Date  . ANKLE CLOSED REDUCTION Right   . COLONOSCOPY N/A 05/31/2017   Procedure: COLONOSCOPY;  Surgeon: Daneil Dolin, MD;  Location[facility];  Service: Endoscopy;  Laterality: N/A;  2:45pm  . POLYPECTOMY  05/31/2017   Procedure: POLYPECTOMY;  Surgeon: Daneil Dolin, MD;  Location: [facility] ENDO SUITE;  Service: Endoscopy;;  . TONSILLECTOMY          Home Medications    Prior to Admission medications   Medication Sig Start Date End Date Taking? Authorizing Provider  amLODipine (NORVASC) 5 MG tablet Take 1 tablet (5 mg total) by mouth daily. 07/07/17  Yes Rai, Ripudeep  K, MD  aspirin EC 81 MG tablet Take 81 mg by mouth daily.   Yes [provider]  atorvastatin (LIPITOR) 40 MG tablet Take 1 tablet (40 mg total) by mouth daily at 6 PM. Patient taking differently: Take 40 mg by mouth every morning.  05/23/17  Yes Derek Jack, MD  carvedilol (COREG) 25 MG tablet Take 1 tablet (25 mg total) by mouth 2 (two) times daily with a meal. 09/19/17  Yes Tat, Shanon Brow, MD  ferrous sulfate 325 (65 FE) MG tablet Take 1 tablet (325 mg total) by mouth daily with breakfast. 09/19/17  Yes Tat, Shanon Brow, MD  furosemide (LASIX) 40 MG tablet Take 40 mg by  mouth daily.   Yes [provider]  cefdinir (OMNICEF) 300 MG capsule Take 1 capsule (300 mg total) by mouth every 12 (twelve) hours. Patient not taking: Reported on 10/01/2017 09/19/17   Orson Eva, MD  finasteride (PROSCAR) 5 MG tablet Take 1 tablet (5 mg total) by mouth daily. 09/03/17   Heath Lark D, DO    Family History Family History  Problem Relation Age of Onset  . Stroke Father   . Cirrhosis Mother   . Colon cancer Neg Hx     Social History Social History   Tobacco Use  . Smoking status: Current Some Day Smoker    Packs/day: 0.10    Years: 40.00    Pack years: 4.00    Types: Cigarettes  . Smokeless tobacco: Never Used  Substance Use Topics  . Alcohol use: Not Currently    Frequency: Never  . Drug use: Never     Allergies   Patient has no known allergies.   Review of Systems Review of Systems  Constitutional: Negative for activity change, appetite change, chills and fever.  Respiratory: Negative for chest tightness and shortness of breath.   Gastrointestinal: Positive for abdominal pain. Negative for nausea and vomiting.  Genitourinary: Positive for decreased urine volume and difficulty urinating. Negative for dysuria and flank pain.       Decreased urine in his foley catheter  Musculoskeletal: Negative for back pain.  Skin: Negative for rash.  Neurological: Negative for dizziness, weakness and numbness.  Hematological: Negative for adenopathy.  Psychiatric/Behavioral: Negative for confusion.     Physical Exam Updated Vital Signs BP (!) 192/85 (BP Location: Left Arm)   Pulse 92   Temp 98 F (36.7 C) (Oral)   Resp 16   Ht 6' (1.829 m)   Wt 73.9 kg   SpO2 98%   BMI 22.11 kg/m   Physical Exam  Constitutional:  Pt uncomfortable appearing  Cardiovascular: Normal rate and regular rhythm.  Pulmonary/Chest: Effort normal and breath sounds normal. No respiratory distress.  Abdominal: Soft. He exhibits no distension. There is tenderness  (suprapubic tenderness w/o guarding).  Genitourinary:  Genitourinary Comments: Foley catheter inserted into urethra.  No urine leaking at the insertion site.  Small amt yellow urine in leg bag.    Musculoskeletal: Normal range of motion. He exhibits no edema or tenderness.  Neurological: He is alert. No sensory deficit.  Skin: Skin is warm. No rash noted.  Nursing note and vitals reviewed.    ED Treatments / Results  Labs (all labs ordered are listed, but only abnormal results are displayed) Labs Reviewed - No data to display  EKG None  Radiology Dg Chest 2 View  Result Date: 10/01/2017 CLINICAL DATA:  62 year old male with syncope at home. EXAM: CHEST - 2 VIEW COMPARISON:  Prior chest x-ray  09/01/2017 FINDINGS: The appearance of cardiomegaly is likely exaggerated by frontal technique. No definite cardiomegaly on the lateral view. The heart is likely normal in size. Mediastinal contours remain normal. Atherosclerotic calcifications are present in the transverse aorta. No evidence of pulmonary edema, pleural effusion, pneumothorax or focal airspace consolidation. No acute osseous abnormality. IMPRESSION: No active cardiopulmonary disease. Electronically Signed   By: Jacqulynn Cadet M.D.   On: 10/01/2017 13:05    Procedures Procedures (including critical care time)  Medications Ordered in ED Medications - No data to display   Initial Impression / Assessment and Plan / ED Course  I have reviewed the triage vital signs and the nursing notes.  Pertinent labs & imaging results that were available during my care of the patient were reviewed by me and considered in my medical decision making (see chart for details).     2230 bladder scan obtained and resulted less than 200 mL's.  Foley was irrigated unsuccessfully, small amt of bloody fluid aspirated.   saline flowing back around the urethra.  Pt requesting to have foley removed and wants to attempt to urinate on his own.    Pt  unable to urinate on his own.  Will replace foley.  39 French  Foley placed by nursing with approx 800 cc of urine returned.  Gross hematuria initially, but clearing.  Pt reports feeling much better.  I have discussed the importance of urology follow-up, pt states he anticipates seeing the urologist after the 16th when he gets paid.   Final Clinical Impressions(s) / ED Diagnoses   Final diagnoses:  Problem with Foley catheter, subsequent encounter    ED Discharge Orders    None       Kem Parkinson, PA-C 10/02/17 0039    Julianne Rice, MD 10/04/17 [phone]

## 2017-10-01 NOTE — Discharge Instructions (Addendum)
You were evaluated in the emergency department for lightheadedness and a fainting spell today.  You have had this before and is likely related to your medications.  It will be important for you to stay well-hydrated.  You also had an EKG today that looks different than your prior ones and we reviewed this with cardiology.  They want to set you up for a test this week and the office should contact you on Tuesday.  If you do not hear from them please call the cardiology office.  Return if any concerns.

## 2017-10-01 NOTE — ED Notes (Signed)
Tammy Pa at bedside, pt updated on plan of care,

## 2017-10-01 NOTE — ED Provider Notes (Signed)
[facility] EMERGENCY DEPARTMENT Provider Note   CSN: [id number] Arrival date & time: 10/01/17  1142     History   Chief Complaint Chief Complaint  Patient presents with  . Dizziness    HPI Michael [name] is a 62 y.o. male.  He is here after he had a syncopal event at home.  He states he took his morning medications and about an hour later he started feeling lightheaded.  He says this happens every time he takes his morning medications.  He called the ambulance and then went off to the bathroom.  He said he felt more dizzy after going to the bathroom he was walking towards the door to unlock it for the ambulance when he fell to the ground.  Denies any injury.  He had a fingerstick by EMS was 142.  He is still feeling dizzy here.  He denies headache blurry vision chest pain shortness of breath abdominal pain nausea vomiting or diarrhea.  No change in his medications.  He thinks is been eating and drinking okay.  No fevers or chills.  The history is provided by the patient.  Dizziness  Quality:  Lightheadedness Severity:  Moderate Onset quality:  Gradual Timing:  Constant Progression:  Partially resolved Chronicity:  Recurrent Context: bowel movement and standing up   Relieved by:  Lying down Worsened by:  Sitting upright and standing up Ineffective treatments:  None tried Associated symptoms: syncope   Associated symptoms: no blood in stool, no chest pain, no diarrhea, no headaches, no shortness of breath, no vision changes and no vomiting   Risk factors: heart disease and multiple medications     Past Medical History:  Diagnosis Date  . Acute urinary retention    followed by OP urology   . Carotid stenosis   . CHF (congestive heart failure) (HCC)    LVEF 50-55% 03/2017  . CKD (chronic kidney disease)    Stage II  . Head and neck cancer ([place]) 2019   s/p right facial basal cell carcinoma s/p resection with right partial mastectomy and partal rhinectomy with skin graft  (06/13/17)  . HTN (hypertension)   . Iron deficiency anemia   . Tobacco abuse     Patient Active Problem List   Diagnosis Date Noted  . Complicated UTI (urinary tract infection)   . Sepsis ([place]) 09/17/2017  . Vasovagal syncope 09/17/2017  . Sepsis secondary to UTI ([place]) 09/01/2017  . Syncope 07/05/2017  . COPD exacerbation ([place]) 07/05/2017  . Acute renal failure superimposed on chronic kidney disease ([place]) 07/05/2017  . Catheter-associated urinary tract infection ([place]) 07/05/2017  . Leukocytosis 07/05/2017  . Urinary retention with incomplete bladder emptying 07/05/2017  . UTI (urinary tract infection) 07/05/2017  . Diarrhea 07/05/2017  . Carotid artery disease ([place]) 06/25/2017  . AKI (acute kidney injury) ([place])   . Orthostatic syncope   . Syncope due to orthostatic hypotension 06/24/2017  . Heart murmur 06/24/2017  . Abnormal EKG 06/24/2017  . Abnormal PET scan of colon 05/25/2017  . 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 Periapical abscess 04/04/2017  . Iron (Fe) deficiency anemia 04/04/2017  . Essential hypertension 04/02/2017  . Anemia 04/02/2017  . Acute hypoxemic respiratory failure ([place]) 04/02/2017  . Tobacco abuse 04/02/2017  . Chronic diastolic CHF (congestive heart failure) ([place]) 04/02/2017  . Elevated troponin 04/02/2017  . Thrombocytosis ([place]) 04/02/2017  . Skin lesion of face   . Nasal lesion     Past Surgical History:  Procedure  Laterality Date  . ANKLE CLOSED REDUCTION Right   . COLONOSCOPY N/A 05/31/2017   Procedure: COLONOSCOPY;  Surgeon: Daneil Dolin, MD;  Location[facility];  Service: Endoscopy;  Laterality: N/A;  2:45pm  . POLYPECTOMY  05/31/2017   Procedure: POLYPECTOMY;  Surgeon: Daneil Dolin, MD;  Location: [facility];  Service: Endoscopy;;  . TONSILLECTOMY          Home Medications    Prior to Admission medications   Medication Sig Start Date End Date Taking? Authorizing Provider  amLODipine (NORVASC) 5  MG tablet Take 1 tablet (5 mg total) by mouth daily. 07/07/17   Rai, Vernelle Emerald, MD  aspirin EC 81 MG tablet Take 81 mg by mouth daily.    [provider]  atorvastatin (LIPITOR) 40 MG tablet Take 1 tablet (40 mg total) by mouth daily at 6 PM. 05/23/17   Derek Jack, MD  carvedilol (COREG) 25 MG tablet Take 1 tablet (25 mg total) by mouth 2 (two) times daily with a meal. 09/19/17   Tat, Shanon Brow, MD  cefdinir (OMNICEF) 300 MG capsule Take 1 capsule (300 mg total) by mouth every 12 (twelve) hours. 09/19/17   Orson Eva, MD  ferrous sulfate 325 (65 FE) MG tablet Take 1 tablet (325 mg total) by mouth daily with breakfast. 09/19/17   Tat, Shanon Brow, MD  finasteride (PROSCAR) 5 MG tablet Take 1 tablet (5 mg total) by mouth daily. 09/03/17   Manuella Ghazi, Pratik D, DO  furosemide (LASIX) 40 MG tablet Take 40 mg by mouth daily.    [provider]    Family History Family History  Problem Relation Age of Onset  . Stroke Father   . Cirrhosis Mother   . Colon cancer Neg Hx     Social History Social History   Tobacco Use  . Smoking status: Current Some Day Smoker    Packs/day: 0.10    Years: 40.00    Pack years: 4.00    Types: Cigarettes  . Smokeless tobacco: Never Used  Substance Use Topics  . Alcohol use: Not Currently    Frequency: Never  . Drug use: Never     Allergies   Patient has no known allergies.   Review of Systems Review of Systems  Constitutional: Negative for fever.  HENT: Negative for sore throat.   Eyes: Negative for visual disturbance.  Respiratory: Negative for shortness of breath.   Cardiovascular: Positive for syncope. Negative for chest pain.  Gastrointestinal: Negative for abdominal pain, blood in stool, diarrhea and vomiting.  Genitourinary: Negative for dysuria.  Musculoskeletal: Negative for neck pain.  Skin: Negative for rash.  Neurological: Positive for dizziness. Negative for headaches.     Physical Exam Updated Vital Signs Pulse (!) 56    Temp 97.9 F (36.6 C) (Oral)   Resp 17   Ht 6' (1.829 m)   Wt 73.9 kg   SpO2 100%   BMI 22.11 kg/m   Physical Exam  Constitutional: He is oriented to person, place, and time. He appears well-developed and well-nourished.  HENT:  Head: Normocephalic and atraumatic.  He had prior skin cancer and has a skin graft over his right nasolabial fold and upper lip.  Eyes: Conjunctivae are normal.  Neck: Neck supple.  Cardiovascular: Normal rate, regular rhythm, normal heart sounds and intact distal pulses.  Pulmonary/Chest: Effort normal. No stridor. No respiratory distress.  Few scattered rhonchi clearing with cough.  Abdominal: Soft. He exhibits no mass. There is no tenderness. There  is no guarding.  Musculoskeletal: Normal range of motion. He exhibits no tenderness or deformity.  Neurological: He is alert and oriented to person, place, and time. He has normal strength. No sensory deficit. GCS eye subscore is 4. GCS verbal subscore is 5. GCS motor subscore is 6.  Skin: Skin is warm and dry.  Psychiatric: He has a normal mood and affect.  Nursing note and vitals reviewed.    ED Treatments / Results  Labs (all labs ordered are listed, but only abnormal results are displayed) Labs Reviewed  BASIC METABOLIC PANEL - Abnormal; Notable for the following components:      Result Value   Potassium 3.1 (*)    Glucose, Bld 121 (*)    Creatinine, Ser 1.34 (*)    Calcium 8.8 (*)    GFR calc non Af Amer 55 (*)    All other components within normal limits  CBC WITH DIFFERENTIAL/PLATELET - Abnormal; Notable for the following components:   WBC 10.8 (*)    RBC 3.99 (*)    Hemoglobin 10.2 (*)    HCT 32.6 (*)    MCH 25.6 (*)    Neutro Abs 8.5 (*)    All other components within normal limits  TROPONIN I - Abnormal; Notable for the following components:   Troponin I 0.04 (*)    All other components within normal limits  BRAIN NATRIURETIC PEPTIDE - Abnormal; Notable for the following components:     B Natriuretic Peptide 455.0 (*)    All other components within normal limits  TROPONIN I - Abnormal; Notable for the following components:   Troponin I 0.03 (*)    All other components within normal limits  CBG MONITORING, ED - Abnormal; Notable for the following components:   Glucose-Capillary 113 (*)    All other components within normal limits    EKG EKG Interpretation  Date/Time:  Sunday October 01 2017 11:51:48 EDT Ventricular Rate:  59 PR Interval:    QRS Duration: 112 QT Interval:  481 QTC Calculation: 477 R Axis:   37 Text Interpretation:  Sinus rhythm Prolonged PR interval Anteroseptal infarct, old Repol abnrm suggests ischemia, anterolateral new ischemic changesanterior/inferior  from prior last week  Confirmed by Aletta Edouard [phone]) on 10/01/2017 12:15:57 PM   Radiology Dg Chest 2 View  Result Date: 10/01/2017 CLINICAL DATA:  62 year old male with syncope at home. EXAM: CHEST - 2 VIEW COMPARISON:  Prior chest x-ray 09/01/2017 FINDINGS: The appearance of cardiomegaly is likely exaggerated by frontal technique. No definite cardiomegaly on the lateral view. The heart is likely normal in size. Mediastinal contours remain normal. Atherosclerotic calcifications are present in the transverse aorta. No evidence of pulmonary edema, pleural effusion, pneumothorax or focal airspace consolidation. No acute osseous abnormality. IMPRESSION: No active cardiopulmonary disease. Electronically Signed   By: Jacqulynn Cadet M.D.   On: 10/01/2017 13:05    Procedures Procedures (including critical care time)  Medications Ordered in ED Medications  lidocaine (XYLOCAINE) 2 % jelly 1 application (1 application Urethral Not Given 10/01/17 1729)  sodium chloride 0.9 % bolus 500 mL (0 mLs Intravenous Stopped 10/01/17 1332)  potassium chloride SA (K-DUR,KLOR-CON) CR tablet 40 mEq (40 mEq Oral Given 10/01/17 1332)  ondansetron (ZOFRAN) injection 4 mg (4 mg Intravenous Given 10/01/17 1520)      Initial Impression / Assessment and Plan / ED Course  I have reviewed the triage vital signs and the nursing notes.  Pertinent labs & imaging results that were available during my care  of the patient were reviewed by me and considered in my medical decision making (see chart for details).  Clinical Course as of Oct 01 1940  Sun Oct 01, 5853  1761 62 year old male here with syncope versus presyncope.  States he usually feels dizzy after taking his morning medicines and is talked to his doctors before about this.  Reviewing his medical history is a history of orthostatic hypotension.  He is getting some screening labs EKG chest x-ray.  We are giving him a small fluid bolus.   [MB]  6269 6/19 - cardiac echo - Study Conclusions  - Left ventricle: The cavity size was normal. There was mild   concentric hypertrophy. Systolic function was normal. The   estimated ejection fraction was in the range of 55% to 60%. Wall   motion was normal; there were no regional wall motion   abnormalities. Doppler parameters are consistent with both   elevated ventricular end-diastolic filling pressure and elevated   left atrial filling pressure. - Mitral valve: Calcified annulus. Moderately thickened leaflets . - Left atrium: The atrium was moderately dilated. - Atrial septum: No defect or patent foramen ovale was identified.   [MB]  4854 Reviewed prior ED visits.  Multiple visits for syncope.  All of these are thought to be orthostatic.  Get a cardiology consult during admission in June.  At that point they mentioned consideration for cath versus Lexi view.   [MB]  1350 Eosinophil: 2 [MB]  6270 Discussed with the [facility] cardiologist Dr. Sallyanne Kuster who reviewed the patient's EKG.  He does feel the patient will need a Lexiscan but feels it could be scheduled as an outpatient this week.  He is making a notation to get the patient scheduled by the office when they open up on Tuesday.  He recommended that the  patient call the office if he does not hear from anybody by noon on Tuesday.  We will also check a second troponin.   [MB]  3500 I reviewed the plan with the patient and he is comfortable with that.  He is asking if we can replace his Foley with may be a larger size and so I mentioned that to the nurse and they will look to see if we have anything.   [MB]  1623 Orthostatic vital signs unremarkable.  Second troponin less than the first 1.  Will discharge and he understands that cardiology office will contact him this coming week for further cardiac testing.   [MB]    Clinical Course User Index [MB] Hayden Rasmussen, MD     Final Clinical Impressions(s) / ED Diagnoses   Final diagnoses:  Lightheadedness  Syncope, unspecified syncope type  Abnormal ECG    ED Discharge Orders    None       Hayden Rasmussen, MD 10/01/17 1943

## 2017-10-01 NOTE — ED Notes (Signed)
Patient refuse foley catheter stating one he has in now is not causing any problems nor is it leaking.  Patient educated on foley care.

## 2017-10-01 NOTE — ED Notes (Signed)
Bladder scan patient, resulted <258ml.

## 2017-10-01 NOTE — ED Notes (Signed)
Date and time results received: 10/01/17 1324 (use smartphrase ".now" to insert current time)  Test: Troponin Critical Value: 0.04  Name of Provider Notified: Dr. Melina Copa  Orders Received? Or Actions Taken?: none at this time

## 2017-10-02 ENCOUNTER — Other Ambulatory Visit: Payer: Self-pay

## 2017-10-02 ENCOUNTER — Emergency Department (HOSPITAL_COMMUNITY)
Admission: EM | Admit: 2017-10-02 | Discharge: 2017-10-02 | Disposition: A | Discharge status: Home / Self Care | Payer: Medicaid Other | Attending: Emergency Medicine | Admitting: Emergency Medicine

## 2017-10-02 ENCOUNTER — Encounter (HOSPITAL_COMMUNITY): Payer: Self-pay

## 2017-10-02 DIAGNOSIS — T83018A Breakdown (mechanical) of other indwelling urethral catheter, initial encounter: Secondary | ICD-10-CM | POA: Diagnosis not present

## 2017-10-02 DIAGNOSIS — Y658 Other specified misadventures during surgical and medical care: Secondary | ICD-10-CM | POA: Insufficient documentation

## 2017-10-02 DIAGNOSIS — N189 Chronic kidney disease, unspecified: Secondary | ICD-10-CM | POA: Diagnosis not present

## 2017-10-02 DIAGNOSIS — I5032 Chronic diastolic (congestive) heart failure: Secondary | ICD-10-CM | POA: Insufficient documentation

## 2017-10-02 DIAGNOSIS — Z7982 Long term (current) use of aspirin: Secondary | ICD-10-CM | POA: Insufficient documentation

## 2017-10-02 DIAGNOSIS — Z79899 Other long term (current) drug therapy: Secondary | ICD-10-CM | POA: Insufficient documentation

## 2017-10-02 DIAGNOSIS — I130 Hypertensive heart and chronic kidney disease with heart failure and stage 1 through stage 4 chronic kidney disease, or unspecified chronic kidney disease: Secondary | ICD-10-CM | POA: Diagnosis not present

## 2017-10-02 DIAGNOSIS — F17210 Nicotine dependence, cigarettes, uncomplicated: Secondary | ICD-10-CM | POA: Insufficient documentation

## 2017-10-02 DIAGNOSIS — T839XXD Unspecified complication of genitourinary prosthetic device, implant and graft, subsequent encounter: Secondary | ICD-10-CM

## 2017-10-02 LAB — URINALYSIS, ROUTINE W REFLEX MICROSCOPIC
BILIRUBIN URINE: NEGATIVE
Bacteria, UA: NONE SEEN
Glucose, UA: NEGATIVE mg/dL
KETONES UR: NEGATIVE mg/dL
Nitrite: NEGATIVE
Protein, ur: 100 mg/dL — AB
RBC / HPF: >50 RBC/hpf — ABNORMAL HIGH (ref 0–5)
SPECIFIC GRAVITY, URINE: 1.018 (ref 1.005–1.030)
pH: 5 (ref 5.0–8.0)

## 2017-10-02 MED ORDER — CEFDINIR 300 MG PO CAPS
300.0000 mg | ORAL_CAPSULE | Freq: Once | ORAL | Status: AC
  Administered 2017-10-02: 300 mg via ORAL
  Filled 2017-10-02: qty 1

## 2017-10-02 MED ORDER — CEFDINIR 300 MG PO CAPS: 300.0000 mg | ORAL_CAPSULE | Freq: Two times a day (BID) | ORAL | 0 refills | Status: DC

## 2017-10-02 NOTE — ED Provider Notes (Signed)
[facility] EMERGENCY DEPARTMENT Provider Note   CSN: [id number] Arrival date & time: 10/02/17  0135     History   Chief Complaint Chief Complaint  Patient presents with  . Urinary Retention    HPI Michael [name] is a 62 y.o. male.  Patient presents with Foley catheter not draining.  He was discharged from the ED only 90 minutes ago when his Foley was replaced for similar reasons.  1 L of urine was output.  States his bag is empty and he has suprapubic pain.  Denies fevers, chills, nausea or vomiting.  No chest pain or shortness of breath.  Patient with recurrent issues of urinary retention.  Completed a course of antibiotics for UTI several days ago.  He was seen earlier in the day for syncopal episode as well which is recurrent problem for him also.  He denies any testicular pain.  His urine has been blood-tinged since the new Foley catheter was replaced.  Denies any blood thinner use.  The history is provided by the patient.    Past Medical History:  Diagnosis Date  . Acute urinary retention    followed by OP urology   . Carotid stenosis   . CHF (congestive heart failure) (HCC)    LVEF 50-55% 03/2017  . CKD (chronic kidney disease)    Stage II  . Head and neck cancer ([place]) 2019   s/p right facial basal cell carcinoma s/p resection with right partial mastectomy and partal rhinectomy with skin graft (06/13/17)  . HTN (hypertension)   . Iron deficiency anemia   . Tobacco abuse     Patient Active Problem List   Diagnosis Date Noted  . Complicated UTI (urinary tract infection)   . Sepsis ([place]) 09/17/2017  . Vasovagal syncope 09/17/2017  . Sepsis secondary to UTI ([place]) 09/01/2017  . Syncope 07/05/2017  . COPD exacerbation ([place]) 07/05/2017  . Acute renal failure superimposed on chronic kidney disease ([place]) 07/05/2017  . Catheter-associated urinary tract infection ([place]) 07/05/2017  . Leukocytosis 07/05/2017  . Urinary retention with incomplete bladder emptying 07/05/2017    . UTI (urinary tract infection) 07/05/2017  . Diarrhea 07/05/2017  . Carotid artery disease ([place]) 06/25/2017  . AKI (acute kidney injury) ([place])   . Orthostatic syncope   . Syncope due to orthostatic hypotension 06/24/2017  . Heart murmur 06/24/2017  . Abnormal EKG 06/24/2017  . Abnormal PET scan of colon 05/25/2017  . 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 Periapical abscess 04/04/2017  . Iron (Fe) deficiency anemia 04/04/2017  . Essential hypertension 04/02/2017  . Anemia 04/02/2017  . Acute hypoxemic respiratory failure ([place]) 04/02/2017  . Tobacco abuse 04/02/2017  . Chronic diastolic CHF (congestive heart failure) ([place]) 04/02/2017  . Elevated troponin 04/02/2017  . Thrombocytosis ([place]) 04/02/2017  . Skin lesion of face   . Nasal lesion     Past Surgical History:  Procedure Laterality Date  . ANKLE CLOSED REDUCTION Right   . COLONOSCOPY N/A 05/31/2017   Procedure: COLONOSCOPY;  Surgeon: Daneil Dolin, MD;  [facility];  Service: Endoscopy;  Laterality: N/A;  2:45pm  . POLYPECTOMY  05/31/2017   Procedure: POLYPECTOMY;  Surgeon: Daneil Dolin, MD;  Location: [facility] ENDO [facility];  Service: Endoscopy;;  . TONSILLECTOMY          Home Medications    Prior to Admission medications   Medication Sig Start Date End Date Taking? Authorizing Provider  amLODipine (NORVASC) 5 MG tablet Take 1 tablet (  5 mg total) by mouth daily. 07/07/17   Rai, Vernelle Emerald, MD  aspirin EC 81 MG tablet Take 81 mg by mouth daily.    [provider]  atorvastatin (LIPITOR) 40 MG tablet Take 1 tablet (40 mg total) by mouth daily at 6 PM. Patient taking differently: Take 40 mg by mouth every morning.  05/23/17   Derek Jack, MD  carvedilol (COREG) 25 MG tablet Take 1 tablet (25 mg total) by mouth 2 (two) times daily with a meal. 09/19/17   Tat, Shanon Brow, MD  cefdinir (OMNICEF) 300 MG capsule Take 1 capsule (300 mg total) by mouth every 12 (twelve) hours. Patient  not taking: Reported on 10/01/2017 09/19/17   Orson Eva, MD  ferrous sulfate 325 (65 FE) MG tablet Take 1 tablet (325 mg total) by mouth daily with breakfast. 09/19/17   Tat, Shanon Brow, MD  finasteride (PROSCAR) 5 MG tablet Take 1 tablet (5 mg total) by mouth daily. 09/03/17   Manuella Ghazi, Pratik D, DO  furosemide (LASIX) 40 MG tablet Take 40 mg by mouth daily.    [provider]    Family History Family History  Problem Relation Age of Onset  . Stroke Father   . Cirrhosis Mother   . Colon cancer Neg Hx     Social History Social History   Tobacco Use  . Smoking status: Current Some Day Smoker    Packs/day: 0.10    Years: 40.00    Pack years: 4.00    Types: Cigarettes  . Smokeless tobacco: Never Used  Substance Use Topics  . Alcohol use: Not Currently    Frequency: Never  . Drug use: Never     Allergies   Patient has no known allergies.   Review of Systems Review of Systems  Constitutional: Negative for activity change, appetite change and fever.  HENT: Negative for congestion and rhinorrhea.   Eyes: Negative for visual disturbance.  Respiratory: Negative for chest tightness and shortness of breath.   Cardiovascular: Negative for chest pain.  Gastrointestinal: Positive for abdominal pain. Negative for nausea and vomiting.  Genitourinary: Positive for decreased urine volume and difficulty urinating. Negative for discharge.  Musculoskeletal: Negative for arthralgias and myalgias.  Skin: Negative for wound.  Neurological: Negative for dizziness, weakness and headaches.    all other systems are negative except as noted in the HPI and PMH.    Physical Exam Updated Vital Signs BP (!) 181/76 (BP Location: Right Arm)   Pulse 88   Temp 98.9 F (37.2 C) (Oral)   Resp 18   Ht 6' (1.829 m)   Wt 73.9 kg   SpO2 97%   BMI 22.10 kg/m   Physical Exam  Constitutional: He is oriented to person, place, and time. He appears well-developed and well-nourished. No distress.  HENT:   Head: Normocephalic and atraumatic.  Mouth/Throat: Oropharynx is clear and moist. No oropharyngeal exudate.  Skin graft to face  Eyes: Pupils are equal, round, and reactive to light. Conjunctivae and EOM are normal.  Neck: Normal range of motion. Neck supple.  No meningismus.  Cardiovascular: Normal rate, regular rhythm, normal heart sounds and intact distal pulses.  No murmur heard. Pulmonary/Chest: Effort normal and breath sounds normal. No respiratory distress.  Abdominal: Soft. There is tenderness. There is no rebound and no guarding.  Suprapubic tenderness  Genitourinary:  Genitourinary Comments: Foley catheter in place draining blood-tinged, cloudy urine, approximately 100 cc in bag. No testicular tenderness  Musculoskeletal: Normal range of motion. He exhibits no  edema or tenderness.  Neurological: He is alert and oriented to person, place, and time. No cranial nerve deficit. He exhibits normal muscle tone. Coordination normal.  No ataxia on finger to nose bilaterally. No pronator drift. 5/5 strength throughout. CN 2-12 intact.Equal grip strength. Sensation intact.   Skin: Skin is warm.  Psychiatric: He has a normal mood and affect. His behavior is normal.  Nursing note and vitals reviewed.    ED Treatments / Results  Labs (all labs ordered are listed, but only abnormal results are displayed) Labs Reviewed  URINALYSIS, ROUTINE W REFLEX MICROSCOPIC - Abnormal; Notable for the following components:      Result Value   Color, Urine AMBER (*)    APPearance TURBID (*)    Hgb urine dipstick LARGE (*)    Protein, ur 100 (*)    Leukocytes, UA MODERATE (*)    RBC / HPF >50 (*)    All other components within normal limits  URINE CULTURE    EKG None  Radiology Dg Chest 2 View  Result Date: 10/01/2017 CLINICAL DATA:  62 year old male with syncope at home. EXAM: CHEST - 2 VIEW COMPARISON:  Prior chest x-ray 09/01/2017 FINDINGS: The appearance of cardiomegaly is likely  exaggerated by frontal technique. No definite cardiomegaly on the lateral view. The heart is likely normal in size. Mediastinal contours remain normal. Atherosclerotic calcifications are present in the transverse aorta. No evidence of pulmonary edema, pleural effusion, pneumothorax or focal airspace consolidation. No acute osseous abnormality. IMPRESSION: No active cardiopulmonary disease. Electronically Signed   By: Jacqulynn Cadet M.D.   On: 10/01/2017 13:05    Procedures Procedures (including critical care time)  Medications Ordered in ED Medications - No data to display   Initial Impression / Assessment and Plan / ED Course  I have reviewed the triage vital signs and the nursing notes.  Pertinent labs & imaging results that were available during my care of the patient were reviewed by me and considered in my medical decision making (see chart for details).    Patient presents with suspected Foley catheter malfunction.  Just discharged with new catheter.  Bladder scan shows 0 mL's.  Catheter will be irrigated.  Urine appears cloudy and UA and culture will be sent.  Foley catheter irrigated by nursing staff without difficulty.  It is draining well.  Urinalysis and culture sent.  Previous culture grew Proteus and Klebsiella with sensitivity to cephalosporins. Prophylactic antibiotics will be given while culture is pending.  Patient is afebrile.  He does not appear to be toxic or septic.  Labs from earlier today showed no leukocytosis. Follow-up with urology.  Return precautions discussed.  Final Clinical Impressions(s) / ED Diagnoses   Final diagnoses:  Problem with Foley catheter, subsequent encounter    ED Discharge Orders    None       Lutisha Knoche, Annie Main, MD 10/02/17 0330

## 2017-10-02 NOTE — Discharge Instructions (Addendum)
Follow-up with the urologist.  Take the antibiotic as prescribed.  He will be called if your urine culture grows something different and the antibiotic needs to be changed.  Return to the ED with fever, vomiting, unable to urinate or any other concerns.

## 2017-10-02 NOTE — ED Triage Notes (Signed)
Pt reports urinary retention- pt was discharged around 0030 on this day - he was seen for the same- 20 g foley placed with a return of 900 ml. Pt says it is not draining now.

## 2017-10-02 NOTE — ED Notes (Signed)
Foley changed to leg bag, continues to drain blood tinged urine, pt instructed on care of foley bag, instructed to contact urology for follow up, pt expressed understanding,

## 2017-10-02 NOTE — ED Notes (Signed)
Foley irrigated with 60 ml sterile water via syringe-  Pulled back on syringe and got 60 ml of cloudy, clear water back. No clots present and no resistance with irrigation. Dr Wyvonnia Dusky aware.

## 2017-10-03 ENCOUNTER — Other Ambulatory Visit (HOSPITAL_COMMUNITY): Payer: Self-pay

## 2017-10-04 LAB — URINE CULTURE

## 2017-10-05 ENCOUNTER — Other Ambulatory Visit: Payer: Self-pay

## 2017-10-05 ENCOUNTER — Emergency Department (HOSPITAL_COMMUNITY)
Admission: EM | Admit: 2017-10-05 | Discharge: 2017-10-05 | Disposition: A | Discharge status: Home / Self Care | Payer: Medicaid Other | Attending: Emergency Medicine | Admitting: Emergency Medicine

## 2017-10-05 ENCOUNTER — Encounter (HOSPITAL_COMMUNITY): Payer: Self-pay

## 2017-10-05 DIAGNOSIS — I130 Hypertensive heart and chronic kidney disease with heart failure and stage 1 through stage 4 chronic kidney disease, or unspecified chronic kidney disease: Secondary | ICD-10-CM | POA: Diagnosis not present

## 2017-10-05 DIAGNOSIS — N182 Chronic kidney disease, stage 2 (mild): Secondary | ICD-10-CM | POA: Diagnosis not present

## 2017-10-05 DIAGNOSIS — R339 Retention of urine, unspecified: Secondary | ICD-10-CM | POA: Diagnosis not present

## 2017-10-05 DIAGNOSIS — Z7982 Long term (current) use of aspirin: Secondary | ICD-10-CM | POA: Diagnosis not present

## 2017-10-05 DIAGNOSIS — I5032 Chronic diastolic (congestive) heart failure: Secondary | ICD-10-CM | POA: Diagnosis not present

## 2017-10-05 DIAGNOSIS — Z79899 Other long term (current) drug therapy: Secondary | ICD-10-CM | POA: Diagnosis not present

## 2017-10-05 DIAGNOSIS — F17210 Nicotine dependence, cigarettes, uncomplicated: Secondary | ICD-10-CM | POA: Insufficient documentation

## 2017-10-05 DIAGNOSIS — R319 Hematuria, unspecified: Secondary | ICD-10-CM | POA: Diagnosis present

## 2017-10-05 LAB — URINALYSIS, ROUTINE W REFLEX MICROSCOPIC
BILIRUBIN URINE: NEGATIVE
Glucose, UA: NEGATIVE mg/dL
Ketones, ur: NEGATIVE mg/dL
NITRITE: NEGATIVE
PH: 7 (ref 5.0–8.0)
Protein, ur: NEGATIVE mg/dL
Specific Gravity, Urine: 1.006 (ref 1.005–1.030)

## 2017-10-05 MED ORDER — CEPHALEXIN 500 MG PO CAPS: 500.0000 mg | ORAL_CAPSULE | Freq: Four times a day (QID) | ORAL | 0 refills | Status: DC

## 2017-10-05 MED ORDER — CEFTRIAXONE SODIUM 1 G IJ SOLR
1.0000 g | Freq: Once | INTRAMUSCULAR | Status: AC
  Administered 2017-10-05: 1 g via INTRAMUSCULAR
  Filled 2017-10-05: qty 10

## 2017-10-05 MED ORDER — LIDOCAINE HCL (PF) 1 % IJ SOLN
INTRAMUSCULAR | Status: AC
  Filled 2017-10-05: qty 2

## 2017-10-05 NOTE — ED Notes (Addendum)
Irrigated pt's catheter. Was not able get back any clots. MD notified

## 2017-10-05 NOTE — ED Triage Notes (Signed)
Pt reports urinary retention but says was able to void while in waiting room.  Pt says feels better but wants to be evaluated.

## 2017-10-05 NOTE — Discharge Instructions (Addendum)
Follow up with your urologist.  Also have your blood pressure checked again in 1-2 weeks

## 2017-10-05 NOTE — ED Provider Notes (Signed)
[facility] EMERGENCY DEPARTMENT Provider Note   CSN: [id number] Arrival date & time: 10/05/17  0645     History   Chief Complaint Chief Complaint  Patient presents with  . Urinary Retention    HPI Michael [name] is a 62 y.o. male.  Patient complains of Foley not draining properly and blood in his Foley  The history is provided by the patient. No language interpreter was used.  Hematuria  This is a new problem. The current episode started more than 2 days ago. The problem occurs constantly. The problem has not changed since onset.Pertinent negatives include no chest pain, no abdominal pain and no headaches. Nothing aggravates the symptoms. Nothing relieves the symptoms. He has tried nothing for the symptoms. The treatment provided no relief.    Past Medical History:  Diagnosis Date  . Acute urinary retention    followed by OP urology   . Carotid stenosis   . CHF (congestive heart failure) (HCC)    LVEF 50-55% 03/2017  . CKD (chronic kidney disease)    Stage II  . Head and neck cancer ([place]) 2019   s/p right facial basal cell carcinoma s/p resection with right partial mastectomy and partal rhinectomy with skin graft (06/13/17)  . HTN (hypertension)   . Iron deficiency anemia   . Tobacco abuse     Patient Active Problem List   Diagnosis Date Noted  . Complicated UTI (urinary tract infection)   . Sepsis ([place]) 09/17/2017  . Vasovagal syncope 09/17/2017  . Sepsis secondary to UTI ([place]) 09/01/2017  . Syncope 07/05/2017  . COPD exacerbation ([place]) 07/05/2017  . Acute renal failure superimposed on chronic kidney disease ([place]) 07/05/2017  . Catheter-associated urinary tract infection ([place]) 07/05/2017  . Leukocytosis 07/05/2017  . Urinary retention with incomplete bladder emptying 07/05/2017  . UTI (urinary tract infection) 07/05/2017  . Diarrhea 07/05/2017  . Carotid artery disease ([place]) 06/25/2017  . AKI (acute kidney injury) ([place])   . Orthostatic syncope   . Syncope  due to orthostatic hypotension 06/24/2017  . Heart murmur 06/24/2017  . Abnormal EKG 06/24/2017  . Abnormal PET scan of colon 05/25/2017  . 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 Periapical abscess 04/04/2017  . Iron (Fe) deficiency anemia 04/04/2017  . Essential hypertension 04/02/2017  . Anemia 04/02/2017  . Acute hypoxemic respiratory failure ([place]) 04/02/2017  . Tobacco abuse 04/02/2017  . Chronic diastolic CHF (congestive heart failure) ([place]) 04/02/2017  . Elevated troponin 04/02/2017  . Thrombocytosis ([place]) 04/02/2017  . Skin lesion of face   . Nasal lesion     Past Surgical History:  Procedure Laterality Date  . ANKLE CLOSED REDUCTION Right   . COLONOSCOPY N/A 05/31/2017   Procedure: COLONOSCOPY;  Surgeon: Daneil Dolin, MD;  [facility];  Service: Endoscopy;  Laterality: N/A;  2:45pm  . POLYPECTOMY  05/31/2017   Procedure: POLYPECTOMY;  Surgeon: Daneil Dolin, MD;  Location: [facility];  Service: Endoscopy;;  . TONSILLECTOMY          Home Medications    Prior to Admission medications   Medication Sig Start Date End Date Taking? Authorizing Provider  amLODipine (NORVASC) 5 MG tablet Take 1 tablet (5 mg total) by mouth daily. 07/07/17   Rai, Vernelle Emerald, MD  aspirin EC 81 MG tablet Take 81 mg by mouth daily.    [provider]  atorvastatin (LIPITOR) 40 MG tablet Take 1 tablet (40 mg total) by mouth daily at 6  PM. Patient taking differently: Take 40 mg by mouth every morning.  05/23/17   Derek Jack, MD  carvedilol (COREG) 25 MG tablet Take 1 tablet (25 mg total) by mouth 2 (two) times daily with a meal. 09/19/17   Tat, Shanon Brow, MD  cefdinir (OMNICEF) 300 MG capsule Take 1 capsule (300 mg total) by mouth 2 (two) times daily. 10/02/17   Rancour, Annie Main, MD  cephALEXin (KEFLEX) 500 MG capsule Take 1 capsule (500 mg total) by mouth 4 (four) times daily. 10/05/17   Milton Ferguson, MD  ferrous sulfate 325 (65 FE) MG tablet Take 1  tablet (325 mg total) by mouth daily with breakfast. 09/19/17   Tat, Shanon Brow, MD  finasteride (PROSCAR) 5 MG tablet Take 1 tablet (5 mg total) by mouth daily. 09/03/17   Manuella Ghazi, Pratik D, DO  furosemide (LASIX) 40 MG tablet Take 40 mg by mouth daily.    [provider]    Family History Family History  Problem Relation Age of Onset  . Stroke Father   . Cirrhosis Mother   . Colon cancer Neg Hx     Social History Social History   Tobacco Use  . Smoking status: Current Some Day Smoker    Packs/day: 0.10    Years: 40.00    Pack years: 4.00    Types: Cigarettes  . Smokeless tobacco: Never Used  Substance Use Topics  . Alcohol use: Not Currently    Frequency: Never  . Drug use: Never     Allergies   Patient has no known allergies.   Review of Systems Review of Systems  Constitutional: Negative for appetite change and fatigue.  HENT: Negative for congestion, ear discharge and sinus pressure.   Eyes: Negative for discharge.  Respiratory: Negative for cough.   Cardiovascular: Negative for chest pain.  Gastrointestinal: Negative for abdominal pain and diarrhea.  Genitourinary: Positive for hematuria. Negative for frequency.  Musculoskeletal: Negative for back pain.  Skin: Negative for rash.  Neurological: Negative for seizures and headaches.  Psychiatric/Behavioral: Negative for hallucinations.     Physical Exam Updated Vital Signs BP (!) 193/102 (BP Location: Left Arm)   Pulse 88   Temp 97.9 F (36.6 C) (Oral)   Resp 18   Ht 6' (1.829 m)   Wt 73.9 kg   SpO2 100%   BMI 22.11 kg/m   Physical Exam  Constitutional: He is oriented to person, place, and time. He appears well-developed.  HENT:  Head: Normocephalic.  Eyes: Conjunctivae are normal.  Neck: No tracheal deviation present.  Cardiovascular:  No murmur heard. Genitourinary:  Genitourinary Comments: Blood in the Foley.  Musculoskeletal: Normal range of motion.  Neurological: He is oriented to  person, place, and time.  Skin: Skin is warm.  Psychiatric: He has a normal mood and affect.     ED Treatments / Results  Labs (all labs ordered are listed, but only abnormal results are displayed) Labs Reviewed  URINALYSIS, ROUTINE W REFLEX MICROSCOPIC - Abnormal; Notable for the following components:      Result Value   APPearance HAZY (*)    Hgb urine dipstick LARGE (*)    Leukocytes, UA LARGE (*)    RBC / HPF >50 (*)    WBC, UA >50 (*)    Bacteria, UA RARE (*)    All other components within normal limits  URINE CULTURE    EKG None  Radiology No results found.  Procedures Procedures (including critical care time)  Medications Ordered in ED Medications  lidocaine (PF) (XYLOCAINE) 1 % injection (has no administration in time range)  cefTRIAXone (ROCEPHIN) injection 1 g (1 g Intramuscular Given 10/05/17 9371)     Initial Impression / Assessment and Plan / ED Course  I have reviewed the triage vital signs and the nursing notes.  Pertinent labs & imaging results that were available during my care of the patient were reviewed by me and considered in my medical decision making (see chart for details).     Foley was irrigated but would not drain.  So it was replaced.  Analysis suggests patient has urinary tract infection.  He is given Rocephin IM and Keflex p.o. and urine cultures done in and he is to follow-up with urology  Final Clinical Impressions(s) / ED Diagnoses   Final diagnoses:  Urinary retention    ED Discharge Orders         Ordered    cephALEXin (KEFLEX) 500 MG capsule  4 times daily     10/05/17 0856           Milton Ferguson, MD 10/05/17 [phone]

## 2017-10-08 LAB — URINE CULTURE: Culture: >=100000 — AB

## 2017-10-09 ENCOUNTER — Telehealth: Payer: Self-pay | Admitting: Emergency Medicine

## 2017-10-09 ENCOUNTER — Ambulatory Visit (HOSPITAL_COMMUNITY): Payer: Self-pay | Admitting: Internal Medicine

## 2017-10-09 NOTE — Telephone Encounter (Signed)
Post ED Visit - Positive Culture Follow-up: Successful Patient Follow-Up  Culture assessed and recommendations reviewed by:  []  Elenor Quinones, Pharm.D. []  Heide Guile, Pharm.D., BCPS AQ-ID []  Parks Neptune, Pharm.D., BCPS []  Alycia Rossetti, Pharm.D., BCPS []  [place], Pharm.D., BCPS, AAHIVP []  Legrand Como, Pharm.D., BCPS, AAHIVP []  Salome Arnt, PharmD, BCPS []  Johnnette Gourd, PharmD, BCPS []  Hughes Better, PharmD, BCPS []  Leeroy Cha, PharmD  Positive urine culture  []  Patient discharged without antimicrobial prescription and treatment is now indicated []  Organism is resistant to prescribed ED discharge antimicrobial []  Patient with positive blood cultures  Changes discussed with ED provider: Suella Broad PA New antibiotic prescription symptom check, pt denies abdominal pain or fever, advised to stop cephalexin    Hazle Nordmann 10/09/2017, 1:17 PM

## 2017-10-11 ENCOUNTER — Telehealth: Payer: Self-pay

## 2017-10-11 ENCOUNTER — Telehealth: Payer: Self-pay | Admitting: Cardiovascular Disease

## 2017-10-11 DIAGNOSIS — I951 Orthostatic hypotension: Secondary | ICD-10-CM

## 2017-10-11 NOTE — Telephone Encounter (Signed)
Roland Earl  Truitt, Dionne Bucy, CMA      Previous Messages    ----- Message -----  From: Sanda Klein, MD  Sent: 10/01/2017  1:59 PM EDT  To: Roland Earl   Please schedule him for an outpatient Lexiscan Myoview and a follow-up with Dr. Claiborne Billings or APP as soon as possible after the test.  reason "abnormal ECG, syncope"  Thank you

## 2017-10-11 NOTE — Telephone Encounter (Signed)
Discussed with patient the purpose of having the test completed. Patient verbalized understanding and would like to have it scheduled.

## 2017-10-11 NOTE — Telephone Encounter (Signed)
I called this patient to schedule a Lexiscan myoview that was ordered by Dr. Sallyanne Kuster (pt was seen in the ED).  Patient states no one told him anything about this and he wants to speak with someone before he schedules.

## 2017-10-12 ENCOUNTER — Telehealth: Payer: Self-pay | Admitting: *Deleted

## 2017-10-12 NOTE — Telephone Encounter (Signed)
Called and left message for patient to call and schedule stress test ordered by Dr. Sallyanne Kuster

## 2017-10-13 NOTE — Telephone Encounter (Signed)
Goins, Bertram Gala  Kennetta Pavlovic, Dionne Bucy, CMA        Good Morning,   Patient was transferred to me here in [place] office to schedule his myoview. Will you please change the order to [id number]? It's a different order for here because they are done in the actual hospital.   Thanks,  Coralyn Mark

## 2017-10-13 NOTE — Telephone Encounter (Signed)
lexiscan reordered.

## 2017-10-13 NOTE — Addendum Note (Signed)
Addended by: Diana Eves on: 10/13/2017 08:12 AM   Modules accepted: Orders

## 2017-10-19 ENCOUNTER — Encounter (HOSPITAL_COMMUNITY)
Admission: RE | Admit: 2017-10-19 | Discharge: 2017-10-19 | Disposition: A | Discharge status: Home / Self Care | Payer: Medicaid Other | Source: Ambulatory Visit | Attending: Cardiovascular Disease | Admitting: Cardiovascular Disease

## 2017-10-19 ENCOUNTER — Ambulatory Visit (HOSPITAL_COMMUNITY)
Admission: RE | Admit: 2017-10-19 | Discharge: 2017-10-19 | Disposition: A | Discharge status: Home / Self Care | Payer: Medicaid Other | Source: Ambulatory Visit | Attending: Cardiovascular Disease | Admitting: Cardiovascular Disease

## 2017-10-19 ENCOUNTER — Encounter (HOSPITAL_COMMUNITY): Payer: Self-pay

## 2017-10-19 DIAGNOSIS — I951 Orthostatic hypotension: Secondary | ICD-10-CM | POA: Insufficient documentation

## 2017-10-19 LAB — NM MYOCAR MULTI W/SPECT W/WALL MOTION / EF
CHL CUP NUCLEAR SDS: 3
CHL CUP NUCLEAR SRS: 3
CHL CUP RESTING HR STRESS: 77 {beats}/min
LHR: 0.37
LV dias vol: 185 mL (ref 62–150)
LVSYSVOL: 123 mL
Peak HR: 96 {beats}/min
SSS: 6
TID: 1

## 2017-10-19 MED ORDER — SODIUM CHLORIDE 0.9% FLUSH
INTRAVENOUS | Status: AC
  Administered 2017-10-19: 10 mL via INTRAVENOUS
  Filled 2017-10-19: qty 10

## 2017-10-19 MED ORDER — REGADENOSON 0.4 MG/5ML IV SOLN
INTRAVENOUS | Status: AC
  Administered 2017-10-19: 0.4 mg via INTRAVENOUS
  Filled 2017-10-19: qty 5

## 2017-10-19 MED ORDER — TECHNETIUM TC 99M TETROFOSMIN IV KIT
10.0000 | PACK | Freq: Once | INTRAVENOUS | Status: AC | PRN
  Administered 2017-10-19: 11 via INTRAVENOUS

## 2017-10-19 MED ORDER — TECHNETIUM TC 99M TETROFOSMIN IV KIT
30.0000 | PACK | Freq: Once | INTRAVENOUS | Status: AC | PRN
  Administered 2017-10-19: 33 via INTRAVENOUS

## 2017-11-03 ENCOUNTER — Encounter (HOSPITAL_COMMUNITY): Payer: Self-pay | Admitting: Emergency Medicine

## 2017-11-03 ENCOUNTER — Emergency Department (HOSPITAL_COMMUNITY)
Admission: EM | Admit: 2017-11-03 | Discharge: 2017-11-03 | Disposition: A | Discharge status: Home / Self Care | Payer: Medicaid Other | Attending: Emergency Medicine | Admitting: Emergency Medicine

## 2017-11-03 ENCOUNTER — Other Ambulatory Visit: Payer: Self-pay

## 2017-11-03 DIAGNOSIS — I10 Essential (primary) hypertension: Secondary | ICD-10-CM

## 2017-11-03 DIAGNOSIS — N182 Chronic kidney disease, stage 2 (mild): Secondary | ICD-10-CM | POA: Diagnosis not present

## 2017-11-03 DIAGNOSIS — I130 Hypertensive heart and chronic kidney disease with heart failure and stage 1 through stage 4 chronic kidney disease, or unspecified chronic kidney disease: Secondary | ICD-10-CM | POA: Diagnosis not present

## 2017-11-03 DIAGNOSIS — Z79899 Other long term (current) drug therapy: Secondary | ICD-10-CM | POA: Diagnosis not present

## 2017-11-03 DIAGNOSIS — J449 Chronic obstructive pulmonary disease, unspecified: Secondary | ICD-10-CM | POA: Diagnosis not present

## 2017-11-03 DIAGNOSIS — R319 Hematuria, unspecified: Secondary | ICD-10-CM | POA: Insufficient documentation

## 2017-11-03 DIAGNOSIS — I5032 Chronic diastolic (congestive) heart failure: Secondary | ICD-10-CM | POA: Insufficient documentation

## 2017-11-03 DIAGNOSIS — F17210 Nicotine dependence, cigarettes, uncomplicated: Secondary | ICD-10-CM | POA: Diagnosis not present

## 2017-11-03 DIAGNOSIS — Z85828 Personal history of other malignant neoplasm of skin: Secondary | ICD-10-CM | POA: Diagnosis not present

## 2017-11-03 LAB — URINALYSIS, ROUTINE W REFLEX MICROSCOPIC
Bilirubin Urine: NEGATIVE
GLUCOSE, UA: NEGATIVE mg/dL
Ketones, ur: NEGATIVE mg/dL
NITRITE: NEGATIVE
Protein, ur: 100 mg/dL — AB
SPECIFIC GRAVITY, URINE: 1.006 (ref 1.005–1.030)
pH: 7 (ref 5.0–8.0)

## 2017-11-03 NOTE — ED Notes (Signed)
Leg bag attached.  

## 2017-11-03 NOTE — ED Provider Notes (Signed)
Emergency Department Provider Note   I have reviewed the triage vital signs and the nursing notes.   HISTORY  Chief Complaint Hematuria   HPI Michael [name] is a 62 y.o. male has a history of acute urinary retention followed by urology the presents emergency department today with hematuria.  Patient states that started just prior to arrival.  However at this time patient has improved to light yellow urine. After seeing his blood pressure asked him about headache, chest pain, nausea, vomiting, shortness of breath, lower extremity swelling or decreased urine output he denied all these.  He states that he did not take his medication for the last couple days because he is going to work on getting some different orders as they make him lightheaded. No other associated or modifying symptoms.    Past Medical History:  Diagnosis Date  . Acute urinary retention    followed by OP urology   . Carotid stenosis   . CHF (congestive heart failure) (HCC)    LVEF 50-55% 03/2017  . CKD (chronic kidney disease)    Stage II  . Head and neck cancer ([place]) 2019   s/p right facial basal cell carcinoma s/p resection with right partial mastectomy and partal rhinectomy with skin graft (06/13/17)  . HTN (hypertension)   . Iron deficiency anemia   . Tobacco abuse     Patient Active Problem List   Diagnosis Date Noted  . Complicated UTI (urinary tract infection)   . Sepsis ([place]) 09/17/2017  . Vasovagal syncope 09/17/2017  . Sepsis secondary to UTI ([place]) 09/01/2017  . Syncope 07/05/2017  . COPD exacerbation ([place]) 07/05/2017  . Acute renal failure superimposed on chronic kidney disease ([place]) 07/05/2017  . Catheter-associated urinary tract infection ([place]) 07/05/2017  . Leukocytosis 07/05/2017  . Urinary retention with incomplete bladder emptying 07/05/2017  . UTI (urinary tract infection) 07/05/2017  . Diarrhea 07/05/2017  . Carotid artery disease ([place]) 06/25/2017  . AKI (acute kidney injury)  ([place])   . Orthostatic syncope   . Syncope due to orthostatic hypotension 06/24/2017  . Heart murmur 06/24/2017  . Abnormal EKG 06/24/2017  . Abnormal PET scan of colon 05/25/2017  . 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 Periapical abscess 04/04/2017  . Iron (Fe) deficiency anemia 04/04/2017  . Essential hypertension 04/02/2017  . Anemia 04/02/2017  . Acute hypoxemic respiratory failure ([place]) 04/02/2017  . Tobacco abuse 04/02/2017  . Chronic diastolic CHF (congestive heart failure) ([place]) 04/02/2017  . Elevated troponin 04/02/2017  . Thrombocytosis ([place]) 04/02/2017  . Skin lesion of face   . Nasal lesion     Past Surgical History:  Procedure Laterality Date  . ANKLE CLOSED REDUCTION Right   . COLONOSCOPY N/A 05/31/2017   Procedure: COLONOSCOPY;  Surgeon: Daneil Dolin, MD;  [facility];  Service: Endoscopy;  Laterality: N/A;  2:45pm  . POLYPECTOMY  05/31/2017   Procedure: POLYPECTOMY;  Surgeon: Daneil Dolin, MD;  [facility];  Service: Endoscopy;;  . TONSILLECTOMY      Current Outpatient Rx  . Order #: [id number] Class: Print  . Order #: [id number] Class: Historical Med  . Order #: [id number] Class: Normal  . Order #: [id number] Class: Normal  . Order #: [id number] Class: Normal  . Order #: [id number] Class: Print  . Order #: [id number] Class: OTC  . Order #: [id number] Class: Normal  . Order #: [id number] Class: Historical Med    Allergies Patient has no known allergies.  Family History  Problem Relation Age of  Onset  . Stroke Father   . Cirrhosis Mother   . Colon cancer Neg Hx     Social History Social History   Tobacco Use  . Smoking status: Current Some Day Smoker    Packs/day: 0.10    Years: 40.00    Pack years: 4.00    Types: Cigarettes  . Smokeless tobacco: Never Used  Substance Use Topics  . Alcohol use: Not Currently    Frequency: Never  . Drug use: Never    Review of Systems  All other systems negative except  as documented in the HPI. All pertinent positives and negatives as reviewed in the HPI. ____________________________________________   PHYSICAL EXAM:  VITAL SIGNS: ED Triage Vitals  Enc Vitals Group     BP 11/03/17 2036 (!) 245/105     Pulse Rate 11/03/17 2036 95     Resp 11/03/17 2036 19     Temp 11/03/17 2036 98.5 F (36.9 C)     Temp Source 11/03/17 2036 Oral     SpO2 11/03/17 2036 96 %     Weight 11/03/17 2035 163 lb (73.9 kg)     Height 11/03/17 2035 6' (1.829 m)     Head Circumference --      Peak Flow --      Pain Score 11/03/17 2036 0     Pain Loc --      Pain Edu? --      Excl. in [place]? --     Constitutional: Alert and oriented. Well appearing and in no acute distress. Eyes: Conjunctivae are normal. PERRL. EOMI. Head: Atraumatic. Nose: No congestion/rhinnorhea. Mouth/Throat: Mucous membranes are moist.  Oropharynx non-erythematous. Neck: No stridor.  No meningeal signs.   Cardiovascular: Normal rate, regular rhythm. Good peripheral circulation. Grossly normal heart sounds.   Respiratory: Normal respiratory effort.  No retractions. Lungs CTAB. Gastrointestinal: Soft and nontender. No distention.  Musculoskeletal: No lower extremity tenderness nor edema. No gross deformities of extremities. Neurologic:  Normal speech and language. No gross focal neurologic deficits are appreciated.  Skin:  Skin is warm, dry and intact. No rash noted.   ____________________________________________   LABS (all labs ordered are listed, but only abnormal results are displayed)  Labs Reviewed  URINALYSIS, ROUTINE W REFLEX MICROSCOPIC - Abnormal; Notable for the following components:      Result Value   APPearance HAZY (*)    Hgb urine dipstick LARGE (*)    Protein, ur 100 (*)    Leukocytes, UA LARGE (*)    RBC / HPF >50 (*)    WBC, UA >50 (*)    Bacteria, UA RARE (*)    All other components within normal limits  URINE CULTURE    ____________________________________________  INITIAL IMPRESSION / ASSESSMENT AND PLAN / ED COURSE  Hematuria improved. Culture sent, no other e/o infection at this time, will hold on antibiotics.      Pertinent labs & imaging results that were available during my care of the patient were reviewed by me and considered in my medical decision making (see chart for details).  ____________________________________________  FINAL CLINICAL IMPRESSION(S) / ED DIAGNOSES  Final diagnoses:  Hypertension, unspecified type  Hematuria, unspecified type     MEDICATIONS GIVEN DURING THIS VISIT:  Medications - No data to display   NEW OUTPATIENT MEDICATIONS STARTED DURING THIS VISIT:  New Prescriptions   No medications on file    Note:  This note was prepared with assistance of Dragon voice recognition software. Occasional wrong-word or  sound-a-like substitutions may have occurred due to the inherent limitations of voice recognition software.   Merrily Pew, MD 11/03/17 2351

## 2017-11-03 NOTE — ED Notes (Signed)
Patient states that he is not retaining urine, states that it burns when he goes. States that it is straight blood only.

## 2017-11-03 NOTE — ED Triage Notes (Signed)
Pt has had urinary cath 2 months now. Started urinating blood today. Dark clotty urine noted in leg bag. Pt states he has it for urinary retention. Pain with urination.

## 2017-11-03 NOTE — ED Notes (Signed)
edp aware of bp  

## 2017-11-06 LAB — URINE CULTURE: Special Requests: NORMAL

## 2017-11-22 DIAGNOSIS — R9439 Abnormal result of other cardiovascular function study: Secondary | ICD-10-CM | POA: Insufficient documentation

## 2017-11-22 NOTE — Progress Notes (Signed)
Cardiology Office Note    Date:  11/27/2017   ID:  Michael [name], DOB 1955/07/23, MRN [id number]  PCP:  Patient, No Pcp Per  Cardiologist: No primary care provider on file. EPS: None  No chief complaint on file.   History of Present Illness:  Michael [name] is a 62 y.o. male with history of hypertension, chronic diastolic CHF LVEF 50 to 32%, CKD stage II, right facial basal cell carcinoma status post resection with right parietal mastectomy and partial rhinectomy skin graft 06/13/2017.  Patient was seen in the hospital 07/06/2017 with elevated troponins and syncope felt secondary to orthostatic hypotension secondary to meds and dehydration.  Lexiscan Myoview was recommended as an outpatient.  Patient back in the ER 10/01/2017 with syncope suggestive of orthostatic hypotension.  Amlodipine decreased to 2.5 mg daily and continued on carvedilol.  He does have an abnormal EKG and does have LVH on echo that can produce these changes.  Recommend following up with outpatient stress test.  [facility] Myoview showed inferior septal MI with mild peri-infarct ischemia and was considered a high risk study primarily based on decreased LVEF of 33%.  They recommended follow-up echo.  Has had 3 ER visits since then with urinary retention and now has an indwelling foley but can't get an appt with urologist. Stopped all his meds b/c they all make him dizzy. He was on coreg 25 mg BID and amlodipine 2.5 mg daily. Denies chest pain, palpitations, dyspnea. Smoking 1/2 ppd and trying to quit.   Past Medical History:  Diagnosis Date  . Acute urinary retention    followed by OP urology   . Carotid stenosis   . CHF (congestive heart failure) (HCC)    LVEF 50-55% 03/2017  . CKD (chronic kidney disease)    Stage II  . Head and neck cancer ([place]) 2019   s/p right facial basal cell carcinoma s/p resection with right partial mastectomy and partal rhinectomy with skin graft (06/13/17)  . HTN (hypertension)   . Iron  deficiency anemia   . Tobacco abuse     Past Surgical History:  Procedure Laterality Date  . ANKLE CLOSED REDUCTION Right   . COLONOSCOPY N/A 05/31/2017   Procedure: COLONOSCOPY;  Surgeon: Daneil Dolin, MD;  [facility];  Service: Endoscopy;  Laterality: N/A;  2:45pm  . POLYPECTOMY  05/31/2017   Procedure: POLYPECTOMY;  Surgeon: Daneil Dolin, MD;  [facility];  Service: Endoscopy;;  . TONSILLECTOMY      Current Medications: Current Meds  Medication Sig  . aspirin EC 81 MG tablet Take 81 mg by mouth daily.     Allergies:   Patient has no known allergies.   Social History   Socioeconomic History  . Marital status: Married    Spouse name: Not on file  . Number of children: Not on file  . Years of education: Not on file  . Highest education level: Not on file  Occupational History  . Not on file  Social Needs  . Financial resource strain: Not on file  . Food insecurity:    Worry: Not on file    Inability: Not on file  . Transportation needs:    Medical: Not on file    Non-medical: Not on file  Tobacco Use  . Smoking status: Current Every Day Smoker    Packs/day: 0.50    Years: 40.00    Pack years: 20.00    Types: Cigarettes  . Smokeless tobacco: Never Used  Substance and Sexual Activity  . Alcohol use: Not Currently    Frequency: Never  . Drug use: Never  . Sexual activity: Not on file  Lifestyle  . Physical activity:    Days per week: Not on file    Minutes per session: Not on file  . Stress: Not on file  Relationships  . Social connections:    Talks on phone: Not on file    Gets together: Not on file    Attends religious service: Not on file    Active member of club or organization: Not on file    Attends meetings of clubs or organizations: Not on file    Relationship status: Not on file  Other Topics Concern  . Not on file  Social History Narrative  . Not on file     Family History:  The patient's family history includes  Cirrhosis in his mother; Stroke in his father.   ROS:   Please see the history of present illness.    Review of Systems  Constitution: Negative.  HENT: Negative.   Cardiovascular: Negative.   Respiratory: Negative.   Endocrine: Negative.   Hematologic/Lymphatic: Negative.   Musculoskeletal: Negative.   Gastrointestinal: Negative.   Genitourinary: Negative.        Indwelling foley for urinary retention  Neurological: Negative.    All other systems reviewed and are negative.   PHYSICAL EXAM:   VS:  BP (!) 170/98   Pulse 91   Ht 6' (1.829 m)   Wt 176 lb (79.8 kg)   SpO2 99%   BMI 23.87 kg/m   Physical Exam  GEN: Well nourished, well developed, in no acute distress  Neck: no JVD, carotid bruits, or masses Cardiac:RRR; 1/6 to 2/6 systolic murmur at the left sternal border Respiratory: Decreased breath sounds throughout GI: soft, nontender, nondistended, + BS Ext: without cyanosis, clubbing, or edema, Good distal pulses bilaterally Neuro:  Alert and Oriented x 3, Psych: euthymic mood, full affect  Wt Readings from Last 3 Encounters:  11/27/17 176 lb (79.8 kg)  11/03/17 163 lb (73.9 kg)  10/05/17 163 lb (73.9 kg)      Studies/Labs Reviewed:   EKG:  EKG is not ordered today.  Recent Labs: 04/02/2017: TSH 0.537 09/01/2017: ALT 26 09/19/2017: Magnesium 2.0 10/01/2017: B Natriuretic Peptide 455.0; BUN 17; Creatinine, Ser 1.34; Hemoglobin 10.2; Platelets 347; Potassium 3.1; Sodium 141   Lipid Panel    Component Value Date/Time   CHOL 194 04/02/2017 0541   TRIG 49 04/02/2017 0541   HDL 42 04/02/2017 0541   CHOLHDL 4.6 04/02/2017 0541   VLDL 10 04/02/2017 0541   LDLCALC 142 (H) 04/02/2017 0541    Additional studies/ records that were reviewed today include:   Nuclear stress test 10/2017  Findings consistent with prior inferior/septal myocardial infarction with mild peri-infarct ischemia.  This is a high risk study. Risk primarily based on decreased LVEF. There is a  prior infarct with only mild current ischemia. Consider correlating LVEF with echocardiogram.  The left ventricular ejection fraction is moderately decreased (33%).   2D echo 6/2019Study Conclusions   - Left ventricle: The cavity size was normal. There was mild   concentric hypertrophy. Systolic function was normal. The   estimated ejection fraction was in the range of 55% to 60%. Wall   motion was normal; there were no regional wall motion   abnormalities. Doppler parameters are consistent with both   elevated ventricular end-diastolic filling pressure and elevated   left  atrial filling pressure. - Mitral valve: Calcified annulus. Moderately thickened leaflets . - Left atrium: The atrium was moderately dilated. - Atrial septum: No defect or patent foramen ovale was identified.    CT 3/2019IMPRESSION: 1. No pulmonary embolus. 2. Cardiomegaly with small pleural effusions and mild pulmonary edema, suggesting mild CHF. 3. Mild central bronchial thickening is nonspecific and may be bronchial inflammation or secondary to pulmonary edema. 4. Small right and left upper lobe pulmonary nodules, each measuring 4 mm. No follow-up needed if patient is low-risk (and has no known or suspected primary neoplasm). Non-contrast chest CT can be considered in 12 months if patient is high-risk. This recommendation follows the consensus statement: Guidelines for Management of Incidental Pulmonary Nodules Detected on CT Images: From the Fleischner Society 2017; Radiology 2017; 284:228-243. 5. Moderate Aortic Atherosclerosis (ICD10-I70.0). Coronary artery calcifications.         ASSESSMENT:    1. Chronic diastolic CHF (congestive heart failure) ([place])   2. Essential hypertension   3. Syncope due to orthostatic hypotension   4. Abnormal nuclear stress test   5. Right-sided carotid artery disease, unspecified type ([place])   6. Urinary retention      PLAN:  In order of problems listed  above:  Chronic diastolic CHF 2D echo 10/9369 normal LV function but increased ventricular and end-diastolic filling pressure and elevated left atrial filling pressure.  Patient stopped all his medications due to orthostatic hypotension.  No evidence of heart failure on exam today.  Restart low-dose carvedilol 12.5 mg twice daily.  Will schedule an appointment with cardiologist to become established.  Essential hypertension with mild LVH.  BP up today.  Try to restart low-dose carvedilol 12.5 mg twice daily.  He will call us if he has any dizziness on this dose.  Syncope recurrent felt secondary to orthostatic hypotension medications have been decreased but he stopped them all.  Try to restart low-dose carvedilol.  Abnormal nuclear stress test 10/2017 considered high risk based on LVEF of 33% with prior inferior septal MI and mild peri-infarct ischemia.  No wall motion abnormality on echo 07/2017 and normal LV function at that time.  We will check limited echo to reassess LV function.  Patient has no chest pain or cardiac complaints.  Bilateral carotid stenosis 70 to 99% right ICA, less than 50% left ICA-refer to the [facility]  Urinary retention with indwelling Foley.  Has not been able to follow-up with Dr. Jeffie Pollock with [facility].  Will make a referral.  Medication Adjustments/Labs and Tests Ordered: Current medicines are reviewed at length with the patient today.  Concerns regarding medicines are outlined above.  Medication changes, Labs and Tests ordered today are listed in the Patient Instructions below. There are no Patient Instructions on file for this visit.   Sumner Boast, PA-C  11/27/2017 12:38 PM    [facility] [place], [place], [place]  [address] Phone: [phone]; Fax: [phone]

## 2017-11-27 ENCOUNTER — Ambulatory Visit (INDEPENDENT_AMBULATORY_CARE_PROVIDER_SITE_OTHER): Payer: Medicaid Other | Admitting: Physician Assistant

## 2017-11-27 ENCOUNTER — Encounter: Payer: Self-pay | Admitting: Physician Assistant

## 2017-11-27 VITALS — BP 170/98 | HR 91 | Ht 72.0 in | Wt 176.0 lb

## 2017-11-27 DIAGNOSIS — R339 Retention of urine, unspecified: Secondary | ICD-10-CM | POA: Insufficient documentation

## 2017-11-27 DIAGNOSIS — I10 Essential (primary) hypertension: Secondary | ICD-10-CM

## 2017-11-27 DIAGNOSIS — R9439 Abnormal result of other cardiovascular function study: Secondary | ICD-10-CM

## 2017-11-27 DIAGNOSIS — I5032 Chronic diastolic (congestive) heart failure: Secondary | ICD-10-CM | POA: Diagnosis not present

## 2017-11-27 DIAGNOSIS — I739 Peripheral vascular disease, unspecified: Secondary | ICD-10-CM

## 2017-11-27 DIAGNOSIS — I779 Disorder of arteries and arterioles, unspecified: Secondary | ICD-10-CM

## 2017-11-27 DIAGNOSIS — I951 Orthostatic hypotension: Secondary | ICD-10-CM

## 2017-11-27 MED ORDER — CARVEDILOL 12.5 MG PO TABS: 12.5000 mg | ORAL_TABLET | Freq: Two times a day (BID) | ORAL | 3 refills | Status: DC

## 2017-11-27 MED ORDER — ATORVASTATIN CALCIUM 40 MG PO TABS: 40.0000 mg | ORAL_TABLET | Freq: Every day | ORAL | 3 refills | Status: DC

## 2017-11-27 NOTE — Patient Instructions (Signed)
Medication Instructions:  Your physician has recommended you make the following change in your medication:  Start Lipitor 40 mg Daily  Start Coreg 12.5 mg Two Times Daily   If you need a refill on your cardiac medications before your next appointment, please call your pharmacy.   Lab work: NONE  If you have labs (blood work) drawn today and your tests are completely normal, you will receive your results only by: Marland Kitchen MyChart Message (if you have MyChart) OR . A paper copy in the mail If you have any lab test that is abnormal or we need to change your treatment, we will call you to review the results.  Testing/Procedures: Your physician has requested that you have an echocardiogram. Echocardiography is a painless test that uses sound waves to create images of your heart. It provides your doctor with information about the size and shape of your heart and how well your heart's chambers and valves are working. This procedure takes approximately one hour. There are no restrictions for this procedure.   Follow-Up: At [facility], you and your health needs are our priority.  As part of our continuing mission to provide you with exceptional heart care, we have created designated Provider Care Teams.  These Care Teams include your primary Cardiologist (physician) and Advanced Practice Providers (APPs -  Physician Assistants and Nurse Practitioners) who all work together to provide you with the care you need, when you need it. You will need a follow up appointment in Next Available.  Please call our office 2 months in advance to schedule this appointment.  You may see No primary care provider on file. or one of the following Advanced Practice Providers on your designated Care Team:   [place], PA-C [facility]) . Ermalinda Barrios, PA-C ([place])  Any Other Special Instructions Will Be Listed Below (If Applicable). You have been referred to Dr. Jeffie Pollock  Thank you for choosing [facility]!

## 2017-12-02 ENCOUNTER — Emergency Department (HOSPITAL_COMMUNITY)
Admission: EM | Admit: 2017-12-02 | Discharge: 2017-12-02 | Disposition: A | Discharge status: Home / Self Care | Payer: Medicaid Other | Attending: Emergency Medicine | Admitting: Emergency Medicine

## 2017-12-02 ENCOUNTER — Encounter (HOSPITAL_COMMUNITY): Payer: Self-pay | Admitting: Emergency Medicine

## 2017-12-02 ENCOUNTER — Other Ambulatory Visit: Payer: Self-pay

## 2017-12-02 DIAGNOSIS — T83098A Other mechanical complication of other indwelling urethral catheter, initial encounter: Secondary | ICD-10-CM | POA: Diagnosis present

## 2017-12-02 DIAGNOSIS — I130 Hypertensive heart and chronic kidney disease with heart failure and stage 1 through stage 4 chronic kidney disease, or unspecified chronic kidney disease: Secondary | ICD-10-CM | POA: Insufficient documentation

## 2017-12-02 DIAGNOSIS — F17210 Nicotine dependence, cigarettes, uncomplicated: Secondary | ICD-10-CM | POA: Insufficient documentation

## 2017-12-02 DIAGNOSIS — Y829 Unspecified medical devices associated with adverse incidents: Secondary | ICD-10-CM | POA: Insufficient documentation

## 2017-12-02 DIAGNOSIS — N182 Chronic kidney disease, stage 2 (mild): Secondary | ICD-10-CM | POA: Diagnosis not present

## 2017-12-02 DIAGNOSIS — I5032 Chronic diastolic (congestive) heart failure: Secondary | ICD-10-CM | POA: Diagnosis not present

## 2017-12-02 DIAGNOSIS — Z79899 Other long term (current) drug therapy: Secondary | ICD-10-CM | POA: Insufficient documentation

## 2017-12-02 DIAGNOSIS — Z7982 Long term (current) use of aspirin: Secondary | ICD-10-CM | POA: Insufficient documentation

## 2017-12-02 DIAGNOSIS — T839XXA Unspecified complication of genitourinary prosthetic device, implant and graft, initial encounter: Secondary | ICD-10-CM

## 2017-12-02 NOTE — Discharge Instructions (Addendum)
When they do your ultrasound of your heart this week they will check your blood pressure.  Make sure your cardiologist knows if it still elevated

## 2017-12-02 NOTE — ED Provider Notes (Signed)
[facility] EMERGENCY DEPARTMENT Provider Note   CSN: [id number] Arrival date & time: 12/02/17  1140     History   Chief Complaint Chief Complaint  Patient presents with  . Urinary foley problem    HPI Michael [name] is a 62 y.o. male.  Patient states that his Foley bag is leaking.  He also recently saw his cardiologist and they have adjusted his medicines and taken him off of a lot of blood pressure medicine  The history is provided by the patient. No language interpreter was used.  Illness  This is a new problem. The current episode started 12 to 24 hours ago. The problem occurs constantly. The problem has not changed since onset.Pertinent negatives include no chest pain, no abdominal pain and no headaches. Nothing aggravates the symptoms. Nothing relieves the symptoms. He has tried nothing for the symptoms. The treatment provided no relief.    Past Medical History:  Diagnosis Date  . Acute urinary retention    followed by OP urology   . Carotid stenosis   . CHF (congestive heart failure) (HCC)    LVEF 50-55% 03/2017  . CKD (chronic kidney disease)    Stage II  . Head and neck cancer ([place]) 2019   s/p right facial basal cell carcinoma s/p resection with right partial mastectomy and partal rhinectomy with skin graft (06/13/17)  . HTN (hypertension)   . Iron deficiency anemia   . Tobacco abuse     Patient Active Problem List   Diagnosis Date Noted  . Urinary retention 11/27/2017  . Abnormal nuclear stress test 11/22/2017  . Complicated UTI (urinary tract infection)   . Sepsis ([place]) 09/17/2017  . Vasovagal syncope 09/17/2017  . Sepsis secondary to UTI ([place]) 09/01/2017  . Syncope 07/05/2017  . COPD exacerbation ([place]) 07/05/2017  . Acute renal failure superimposed on chronic kidney disease ([place]) 07/05/2017  . Catheter-associated urinary tract infection ([place]) 07/05/2017  . Leukocytosis 07/05/2017  . Urinary retention with incomplete bladder emptying 07/05/2017  . UTI  (urinary tract infection) 07/05/2017  . Diarrhea 07/05/2017  . Carotid artery disease ([place]) 06/25/2017  . AKI (acute kidney injury) ([place])   . Orthostatic syncope   . Syncope due to orthostatic hypotension 06/24/2017  . Heart murmur 06/24/2017  . Abnormal EKG 06/24/2017  . Abnormal PET scan of colon 05/25/2017  . 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 Periapical abscess 04/04/2017  . Iron (Fe) deficiency anemia 04/04/2017  . Essential hypertension 04/02/2017  . Anemia 04/02/2017  . Acute hypoxemic respiratory failure ([place]) 04/02/2017  . Tobacco abuse 04/02/2017  . Chronic diastolic CHF (congestive heart failure) ([place]) 04/02/2017  . Elevated troponin 04/02/2017  . Thrombocytosis ([place]) 04/02/2017  . Skin lesion of face   . Nasal lesion     Past Surgical History:  Procedure Laterality Date  . ANKLE CLOSED REDUCTION Right   . COLONOSCOPY N/A 05/31/2017   Procedure: COLONOSCOPY;  Surgeon: Daneil Dolin, MD;  [facility];  Service: Endoscopy;  Laterality: N/A;  2:45pm  . POLYPECTOMY  05/31/2017   Procedure: POLYPECTOMY;  Surgeon: Daneil Dolin, MD;  [facility];  Service: Endoscopy;;  . TONSILLECTOMY          Home Medications    Prior to Admission medications   Medication Sig Start Date End Date Taking? Authorizing Provider  aspirin EC 81 MG tablet Take 81 mg by mouth daily.    [provider]  atorvastatin (LIPITOR) 40 MG tablet  Take 1 tablet (40 mg total) by mouth daily. 11/27/17 02/25/18  [name], PA-C  carvedilol (COREG) 12.5 MG tablet Take 1 tablet (12.5 mg total) by mouth 2 (two) times daily. 11/27/17 02/25/18  [name], PA-C    Family History Family History  Problem Relation Age of Onset  . Stroke Father   . Cirrhosis Mother   . Colon cancer Neg Hx     Social History Social History   Tobacco Use  . Smoking status: Current Every Day Smoker    Packs/day: 0.50    Years: 40.00    Pack years:  20.00    Types: Cigarettes  . Smokeless tobacco: Never Used  Substance Use Topics  . Alcohol use: Not Currently    Frequency: Never  . Drug use: Never     Allergies   Patient has no known allergies.   Review of Systems Review of Systems  Constitutional: Negative for appetite change and fatigue.  HENT: Negative for congestion, ear discharge and sinus pressure.   Eyes: Negative for discharge.  Respiratory: Negative for cough.   Cardiovascular: Negative for chest pain.  Gastrointestinal: Negative for abdominal pain and diarrhea.  Genitourinary: Negative for frequency and hematuria.       Foley bag leaking  Musculoskeletal: Negative for back pain.  Skin: Negative for rash.  Neurological: Negative for seizures and headaches.  Psychiatric/Behavioral: Negative for hallucinations.     Physical Exam Updated Vital Signs BP (!) 216/98 (BP Location: Right Arm)   Pulse 79   Temp 97.7 F (36.5 C) (Oral)   Resp 16   Ht 6' (1.829 m)   Wt 78.5 kg   SpO2 99%   BMI 23.46 kg/m   Physical Exam  Constitutional: He is oriented to person, place, and time. He appears well-developed.  HENT:  Head: Normocephalic.  Eyes: Conjunctivae and EOM are normal. No scleral icterus.  Neck: Neck supple. No tracheal deviation present. No thyromegaly present.  Musculoskeletal: Normal range of motion. He exhibits no edema.  Lymphadenopathy:    He has no cervical adenopathy.  Neurological: He is oriented to person, place, and time. Coordination normal.  Skin: Skin is warm. No rash noted. No erythema.  Psychiatric: He has a normal mood and affect. His behavior is normal.     ED Treatments / Results  Labs (all labs ordered are listed, but only abnormal results are displayed) Labs Reviewed - No data to display  EKG None  Radiology No results found.  Procedures Procedures (including critical care time)  Medications Ordered in ED Medications - No data to display   Initial Impression /  Assessment and Plan / ED Course  I have reviewed the triage vital signs and the nursing notes.  Pertinent labs & imaging results that were available during my care of the patient were reviewed by me and considered in my medical decision making (see chart for details).     Patient's Foley bag was leaking.  We will replace his Foley bag.  His blood pressure has been moderately elevated.  He was recently taken off of numerous medicines by his cardiologist.  He is to have an echo this week and will have his blood pressure rechecked and will follow-up with the cardiologist blood pressures continue to be elevated  Final Clinical Impressions(s) / ED Diagnoses   Final diagnoses:  Foley catheter problem, initial encounter [facility])    ED Discharge Orders    None       Milton Ferguson, MD  12/02/17 1228  

## 2017-12-02 NOTE — ED Triage Notes (Signed)
Patient reports leak in urinary foley leg bag that started this week. Per patient had foley placed 10 weeks ago here in ED. Denies any other problems with foley cath.

## 2017-12-05 ENCOUNTER — Ambulatory Visit (HOSPITAL_COMMUNITY)
Admission: RE | Admit: 2017-12-05 | Discharge: 2017-12-05 | Disposition: A | Discharge status: Home / Self Care | Payer: Medicaid Other | Source: Ambulatory Visit | Attending: Physician Assistant | Admitting: Physician Assistant

## 2017-12-05 DIAGNOSIS — R9439 Abnormal result of other cardiovascular function study: Secondary | ICD-10-CM

## 2017-12-05 NOTE — Progress Notes (Signed)
*  PRELIMINARY RESULTS* Echocardiogram 2D Echocardiogram limited has been performed.  [name] 12/05/2017, 1:34 PM

## 2018-01-04 ENCOUNTER — Other Ambulatory Visit: Payer: Self-pay

## 2018-01-04 DIAGNOSIS — I6529 Occlusion and stenosis of unspecified carotid artery: Secondary | ICD-10-CM

## 2018-01-09 ENCOUNTER — Encounter: Payer: Self-pay | Admitting: Cardiology

## 2018-01-09 NOTE — Progress Notes (Signed)
Cardiology Office Note  Date: 01/10/2018   ID: Michael [name], DOB [birth year], MRN [id number]  PCP: Patient, No Pcp Per  Evaluating Cardiologist: Rozann Lesches, MD   Chief Complaint  Patient presents with  . Coronary Artery Disease    History of Present Illness: Michael [name] is a medically complex 62 y.o. male that I am meeting for the first time in clinic today.  He was most recently seen by Ms. Vita Barley in October, I reviewed extensive records and updated the chart.  He has a history of symptomatic orthostatic hypotension, chronic diastolic heart failure, and underlying ischemic heart disease based on abnormal Myoview done in September.  He has also had trouble with urinary retention and still has a Foley catheter in place with leg bag, has had no follow-up with urology as best I can ascertain.  Digging deeper in the chart, I also find significant carotid artery disease with carotid Dopplers from May of this year demonstrating 70 to 99% RICA stenosis, nonobstructive on the left.  He has not seen vascular surgery.  He presents today for a cardiac visit.  He does not report any angina symptoms and has stable NYHA class II dyspnea.  He is currently on aspirin, Lipitor, and Coreg.  He does not have a primary care provider.  I reviewed his lab work from September.  Past Medical History:  Diagnosis Date  . Carotid artery disease ([place])   . Chronic diastolic heart failure ([place])   . CKD (chronic kidney disease) stage 3, GFR 30-59 ml/min (HCC)   . Essential hypertension   . Head and neck cancer ([place]) 2019   Right facial basal cell carcinoma s/p resection with right partial mastectomy and partal rhinectomy with skin graft (06/13/17)  . Iron deficiency anemia   . Urinary retention     Past Surgical History:  Procedure Laterality Date  . ANKLE CLOSED REDUCTION Right   . COLONOSCOPY N/A 05/31/2017   Procedure: COLONOSCOPY;  Surgeon: Daneil Dolin, MD;  [facility];   Service: Endoscopy;  Laterality: N/A;  2:45pm  . POLYPECTOMY  05/31/2017   Procedure: POLYPECTOMY;  Surgeon: Daneil Dolin, MD;  [facility];  Service: Endoscopy;;  . TONSILLECTOMY      Current Outpatient Medications  Medication Sig Dispense Refill  . aspirin EC 81 MG tablet Take 81 mg by mouth daily.    Marland Kitchen atorvastatin (LIPITOR) 40 MG tablet Take 1 tablet (40 mg total) by mouth daily. 90 tablet 3  . carvedilol (COREG) 12.5 MG tablet Take 1 tablet (12.5 mg total) by mouth 2 (two) times daily. 180 tablet 3  . nitroGLYCERIN (NITROSTAT) 0.4 MG SL tablet Place 1 tablet (0.4 mg total) under the tongue every 5 (five) minutes as needed for chest pain. 90 tablet 3   No current facility-administered medications for this visit.    Allergies:  Patient has no known allergies.   Social History: The patient  reports that he has been smoking cigarettes. He has a 20.00 pack-year smoking history. He has never used smokeless tobacco. He reports that he drank alcohol. He reports that he does not use drugs.   Family History: The patient's family history includes Cirrhosis in his mother; Stroke in his father.  ROS:  Please see the history of present illness. Otherwise, complete review of systems is positive for none.  All other systems are reviewed and negative.   Physical Exam: VS:  BP (!) 168/86 (BP Location: Left Arm)  Pulse 74   Ht 6' (1.829 m)   Wt 178 lb (80.7 kg)   SpO2 98%   BMI 24.14 kg/m , BMI Body mass index is 24.14 kg/m.  Wt Readings from Last 3 Encounters:  01/10/18 178 lb (80.7 kg)  12/02/17 173 lb (78.5 kg)  11/27/17 176 lb (79.8 kg)    General: Patient appears comfortable at rest. HEENT: Right-sided facial scar/deformity following surgery, oropharynx clear. Neck: Supple, no elevated JVP, bilateral carotid bruits. Lungs: Clear to auscultation, nonlabored breathing at rest. Cardiac: Regular rate and rhythm, no S3, soft systolic murmur. Abdomen: Soft, nontender, bowel  sounds present. Extremities: No pitting edema, distal pulses 2+. Skin: Warm and dry. Musculoskeletal: No kyphosis. Neuropsychiatric: Alert and oriented x3, affect grossly appropriate.  ECG: I personally reviewed the tracing from 11/27/2017 which showed sinus rhythm with prolonged PR interval and IVCD.  Recent Labwork: 04/02/2017: TSH 0.537 09/01/2017: ALT 26; AST 27 09/19/2017: Magnesium 2.0 10/01/2017: B Natriuretic Peptide 455.0; BUN 17; Creatinine, Ser 1.34; Hemoglobin 10.2; Platelets 347; Potassium 3.1; Sodium 141     Component Value Date/Time   CHOL 194 04/02/2017 0541   TRIG 49 04/02/2017 0541   HDL 42 04/02/2017 0541   CHOLHDL 4.6 04/02/2017 0541   VLDL 10 04/02/2017 0541   LDLCALC 142 (H) 04/02/2017 0541    Other Studies Reviewed Today:  Echocardiogram 12/05/2017: Study Conclusions  - Left ventricle: The cavity size was normal. Wall thickness was   increased in a pattern of mild LVH. Systolic function was mildly   reduced. The estimated ejection fraction was in the range of 45%   to 50%. There is hypokinesis of the basal-midinferolateral   myocardium. Features are consistent with a pseudonormal left   ventricular filling pattern, with concomitant abnormal relaxation   and increased filling pressure (grade 2 diastolic dysfunction). - Aortic valve: Mildly calcified annulus. Trileaflet; mildly   calcified leaflets. - Mitral valve: Mildly calcified annulus. There was mild   regurgitation. - Left atrium: The atrium was mildly dilated. - Tricuspid valve: There was trivial regurgitation. - Pulmonary arteries: Systolic pressure could not be accurately   estimated. - Pericardium, extracardiac: A trivial pericardial effusion was   identified anterior to the heart.  Lexiscan Myoview 10/19/2017:  Findings consistent with prior inferior/septal myocardial infarction with mild peri-infarct ischemia.  This is a high risk study. Risk primarily based on decreased LVEF. There is a  prior infarct with only mild current ischemia. Consider correlating LVEF with echocardiogram.  The left ventricular ejection fraction is moderately decreased (33%).  Carotid Dopplers 06/25/2017: IMPRESSION: 1. Bilateral carotid bifurcation and proximal ICA plaque resulting in 70-99% diameter right ICA stenosis, less than 50% diameter left ICA stenosis. Little change from previous study. 2.  Antegrade bilateral vertebral arterial flow.  Assessment and Plan:  1.  Ischemic heart disease based on Myoview from September demonstrating region of previous inferior scar with mild peri-infarct ischemia.  Study deemed high risk based on reduced ejection fraction, however this was not confirmed by follow-up echocardiogram which revealed LVEF 45 to 50% range.  He does not report any angina at this time and I would recommend continuing medical therapy for now.  Prescription provided for as needed nitroglycerin.  Depending on blood pressure trend and reliability with follow-up, consider adding ARB next.  2.  Severe carotid artery disease, 70 to 99% RICA stenosis based on carotid Dopplers done back in May of this year (I found this reviewing the chart).  Referral being made to [facility].  Continue  aspirin and statin.  3.  Urinary retention with Foley catheter in place for the last several weeks (?).  He has had no follow-up with urology, we are arranging a visit since he has no primary care provider.  4.  No primary care provider, discussed options with patient.  He may need to be seen by the Health Department.  5.  Tobacco abuse, smoking cessation discussed.   Current medicines were reviewed with the patient today.   Orders Placed This Encounter  Procedures  . Ambulatory referral to Urology  . Ambulatory referral to Vascular Surgery    Disposition: Follow-up in 3 months.  Signed, Satira Sark, MD, [facility] 01/10/2018 12:03 PM    [facility] [place]. [address],  [place],  [address] Phone: [phone]; Fax: [phone]

## 2018-01-10 ENCOUNTER — Encounter: Payer: Self-pay | Admitting: Cardiology

## 2018-01-10 ENCOUNTER — Ambulatory Visit (INDEPENDENT_AMBULATORY_CARE_PROVIDER_SITE_OTHER): Payer: Medicaid Other | Admitting: Cardiology

## 2018-01-10 VITALS — BP 168/86 | HR 74 | Ht 72.0 in | Wt 178.0 lb

## 2018-01-10 DIAGNOSIS — I6523 Occlusion and stenosis of bilateral carotid arteries: Secondary | ICD-10-CM | POA: Diagnosis not present

## 2018-01-10 DIAGNOSIS — R339 Retention of urine, unspecified: Secondary | ICD-10-CM

## 2018-01-10 DIAGNOSIS — I259 Chronic ischemic heart disease, unspecified: Secondary | ICD-10-CM

## 2018-01-10 DIAGNOSIS — Z720 Tobacco use: Secondary | ICD-10-CM

## 2018-01-10 MED ORDER — NITROGLYCERIN 0.4 MG SL SUBL: 0.4000 mg | SUBLINGUAL_TABLET | SUBLINGUAL | 3 refills | Status: DC | PRN

## 2018-01-10 NOTE — Patient Instructions (Signed)
Medication Instructions:  I HAVE SENT IN A PRESCRIPTION FOR NITRO   Nitroglycerin sublingual tablets What is this medicine? NITROGLYCERIN (nye troe GLI ser in) is a type of vasodilator. It relaxes blood vessels, increasing the blood and oxygen supply to your heart. This medicine is used to relieve chest pain caused by angina. It is also used to prevent chest pain before activities like climbing stairs, going outdoors in cold weather, or sexual activity. This medicine may be used for other purposes; ask your health care provider or pharmacist if you have questions. COMMON BRAND NAME(S): Nitroquick, Nitrostat, Nitrotab What should I tell my health care provider before I take this medicine? They need to know if you have any of these conditions: -anemia -head injury, recent stroke, or bleeding in the brain -liver disease -previous heart attack -an unusual or allergic reaction to nitroglycerin, other medicines, foods, dyes, or preservatives -pregnant or trying to get pregnant -breast-feeding How should I use this medicine? Take this medicine by mouth as needed. At the first sign of an angina attack (chest pain or tightness) place one tablet under your tongue. You can also take this medicine 5 to 10 minutes before an event likely to produce chest pain. Follow the directions on the prescription label. Let the tablet dissolve under the tongue. Do not swallow whole. Replace the dose if you accidentally swallow it. It will help if your mouth is not dry. Saliva around the tablet will help it to dissolve more quickly. Do not eat or drink, smoke or chew tobacco while a tablet is dissolving. If you are not better within 5 minutes after taking ONE dose of nitroglycerin, call 9-1-1 immediately to seek emergency medical care. Do not take more than 3 nitroglycerin tablets over 15 minutes. If you take this medicine often to relieve symptoms of angina, your doctor or health care professional may provide you with  different instructions to manage your symptoms. If symptoms do not go away after following these instructions, it is important to call 9-1-1 immediately. Do not take more than 3 nitroglycerin tablets over 15 minutes. Talk to your pediatrician regarding the use of this medicine in children. Special care may be needed. Overdosage: If you think you have taken too much of this medicine contact a poison control center or emergency room at once. NOTE: This medicine is only for you. Do not share this medicine with others. What if I miss a dose? This does not apply. This medicine is only used as needed. What may interact with this medicine? Do not take this medicine with any of the following medications: -certain migraine medicines like ergotamine and dihydroergotamine (DHE) -medicines used to treat erectile dysfunction like sildenafil, tadalafil, and vardenafil -riociguat This medicine may also interact with the following medications: -alteplase -aspirin -heparin -medicines for high blood pressure -medicines for mental depression -other medicines used to treat angina -phenothiazines like chlorpromazine, mesoridazine, prochlorperazine, thioridazine This list may not describe all possible interactions. Give your health care provider a list of all the medicines, herbs, non-prescription drugs, or dietary supplements you use. Also tell them if you smoke, drink alcohol, or use illegal drugs. Some items may interact with your medicine. What should I watch for while using this medicine? Tell your doctor or health care professional if you feel your medicine is no longer working. Keep this medicine with you at all times. Sit or lie down when you take your medicine to prevent falling if you feel dizzy or faint after using it. Try to  remain calm. This will help you to feel better faster. If you feel dizzy, take several deep breaths and lie down with your feet propped up, or bend forward with your head resting  between your knees. You may get drowsy or dizzy. Do not drive, use machinery, or do anything that needs mental alertness until you know how this drug affects you. Do not stand or sit up quickly, especially if you are an older patient. This reduces the risk of dizzy or fainting spells. Alcohol can make you more drowsy and dizzy. Avoid alcoholic drinks. Do not treat yourself for coughs, colds, or pain while you are taking this medicine without asking your doctor or health care professional for advice. Some ingredients may increase your blood pressure. What side effects may I notice from receiving this medicine? Side effects that you should report to your doctor or health care professional as soon as possible: -blurred vision -dry mouth -skin rash -sweating -the feeling of extreme pressure in the head -unusually weak or tired Side effects that usually do not require medical attention (report to your doctor or health care professional if they continue or are bothersome): -flushing of the face or neck -headache -irregular heartbeat, palpitations -nausea, vomiting This list may not describe all possible side effects. Call your doctor for medical advice about side effects. You may report side effects to FDA at 1-800-FDA-1088. Where should I keep my medicine? Keep out of the reach of children. Store at room temperature between 20 and 25 degrees C (68 and 77 degrees F). Store in [employer]. Protect from light and moisture. Keep tightly closed. Throw away any unused medicine after the expiration date. NOTE: This sheet is a summary. It may not cover all possible information. If you have questions about this medicine, talk to your doctor, pharmacist, or health care provider.  2018 Elsevier/Gold Standard (2012-11-15 17:57:36)   Labwork: NONE  Testing/Procedures: NONE  Follow-Up: Your physician recommends that you schedule a follow-up appointment in: 3 MONTHS    Any Other Special  Instructions Will Be Listed Below (If Applicable).  You have been referred to UROLOGY   You have been referred to VASCULAR SURGERY      If you need a refill on your cardiac medications before your next appointment, please call your pharmacy.

## 2018-01-18 ENCOUNTER — Other Ambulatory Visit: Payer: Self-pay

## 2018-01-18 ENCOUNTER — Ambulatory Visit (HOSPITAL_COMMUNITY)
Admission: RE | Admit: 2018-01-18 | Discharge: 2018-01-18 | Disposition: A | Discharge status: Home / Self Care | Payer: Medicaid Other | Source: Ambulatory Visit | Attending: Vascular Surgery | Admitting: Vascular Surgery

## 2018-01-18 ENCOUNTER — Ambulatory Visit (INDEPENDENT_AMBULATORY_CARE_PROVIDER_SITE_OTHER): Payer: Medicaid Other | Admitting: Vascular Surgery

## 2018-01-18 ENCOUNTER — Encounter: Payer: Self-pay | Admitting: Vascular Surgery

## 2018-01-18 VITALS — BP 187/92 | HR 74 | Temp 97.7°F | Resp 20 | Ht 72.0 in | Wt 178.0 lb

## 2018-01-18 DIAGNOSIS — I6521 Occlusion and stenosis of right carotid artery: Secondary | ICD-10-CM | POA: Diagnosis not present

## 2018-01-18 DIAGNOSIS — I6529 Occlusion and stenosis of unspecified carotid artery: Secondary | ICD-10-CM | POA: Insufficient documentation

## 2018-01-18 NOTE — Progress Notes (Signed)
Patient name: Michael [name] MRN: [id number] DOB: 1955-11-04 Sex: male  HPI: Michael [name] is a 62 y.o. male who was seen in April 2019 for an asymptomatic greater than 80% right internal carotid artery stenosis.  At that time he also had a very large head and neck cancer.  He subsequently underwent partial rhinectomy maxillectomy and right neck exploration for his head and neck cancer.  He also required a free flap from the left forearm.  He states he has recovered well from this.  He continues to deny any symptoms of TIA amaurosis or stroke.  He is on aspirin and a statin.  Other medical problems include CKD3 recent serum creatinine 1.3, hypertension, urinary retention all of which are currently controlled.  He is trying to quit smoking but currently still smokes 1/2 pack cigarettes per day.  He was counseled against this today.  Past Medical History:  Diagnosis Date  . Carotid artery disease ([place])   . Chronic diastolic heart failure ([place])   . CKD (chronic kidney disease) stage 3, GFR 30-59 ml/min (HCC)   . Essential hypertension   . Head and neck cancer ([place]) 2019   Right facial basal cell carcinoma s/p resection with right partial mastectomy and partal rhinectomy with skin graft (06/13/17)  . Iron deficiency anemia   . Urinary retention    Past Surgical History:  Procedure Laterality Date  . ANKLE CLOSED REDUCTION Right   . COLONOSCOPY N/A 05/31/2017   Procedure: COLONOSCOPY;  Surgeon: Daneil Dolin, MD;  [facility];  Service: Endoscopy;  Laterality: N/A;  2:45pm  . POLYPECTOMY  05/31/2017   Procedure: POLYPECTOMY;  Surgeon: Daneil Dolin, MD;  [facility];  Service: Endoscopy;;  . TONSILLECTOMY      Family History  Problem Relation Age of Onset  . Stroke Father   . Cirrhosis Mother   . Colon cancer Neg Hx     SOCIAL HISTORY: Social History   Socioeconomic History  . Marital status: Married    Spouse name: Not on file  . Number of children: Not  on file  . Years of education: Not on file  . Highest education level: Not on file  Occupational History  . Not on file  Social Needs  . Financial resource strain: Not on file  . Food insecurity:    Worry: Not on file    Inability: Not on file  . Transportation needs:    Medical: Not on file    Non-medical: Not on file  Tobacco Use  . Smoking status: Current Every Day Smoker    Packs/day: 0.50    Years: 40.00    Pack years: 20.00    Types: Cigarettes  . Smokeless tobacco: Never Used  Substance and Sexual Activity  . Alcohol use: Not Currently    Frequency: Never  . Drug use: Never  . Sexual activity: Not on file  Lifestyle  . Physical activity:    Days per week: Not on file    Minutes per session: Not on file  . Stress: Not on file  Relationships  . Social connections:    Talks on phone: Not on file    Gets together: Not on file    Attends religious service: Not on file    Active member of club or organization: Not on file    Attends meetings of clubs or organizations: Not on file    Relationship status: Not on file  . Intimate partner violence:  Fear of current or ex partner: Not on file    Emotionally abused: Not on file    Physically abused: Not on file    Forced sexual activity: Not on file  Other Topics Concern  . Not on file  Social History Narrative  . Not on file    No Known Allergies  Current Outpatient Medications  Medication Sig Dispense Refill  . aspirin EC 81 MG tablet Take 81 mg by mouth daily.    Marland Kitchen atorvastatin (LIPITOR) 40 MG tablet Take 1 tablet (40 mg total) by mouth daily. 90 tablet 3  . carvedilol (COREG) 12.5 MG tablet Take 1 tablet (12.5 mg total) by mouth 2 (two) times daily. 180 tablet 3  . nitroGLYCERIN (NITROSTAT) 0.4 MG SL tablet Place 1 tablet (0.4 mg total) under the tongue every 5 (five) minutes as needed for chest pain. 90 tablet 3   No current facility-administered medications for this visit.     ROS:   General:  No  weight loss, Fever, chills  HEENT: No recent headaches, no nasal bleeding, no visual changes, no sore throat  Neurologic: No dizziness, blackouts, seizures. No recent symptoms of stroke or mini- stroke. No recent episodes of slurred speech, or temporary blindness.  Cardiac: No recent episodes of chest pain/pressure, no shortness of breath at rest.  No shortness of breath with exertion.  Denies history of atrial fibrillation or irregular heartbeat  Vascular: No history of rest pain in feet.  No history of claudication.  No history of non-healing ulcer, No history of DVT   Pulmonary: No home oxygen, no productive cough, no hemoptysis,  No asthma or wheezing  Musculoskeletal:  [ ]  Arthritis, [ ]  Low back pain,  [ ]  Joint pain  Hematologic:No history of hypercoagulable state.  No history of easy bleeding.  No history of anemia  Gastrointestinal: No hematochezia or melena,  No gastroesophageal reflux, no trouble swallowing  Urinary: [X]  chronic Kidney disease, [ ]  on HD - [ ]  MWF or [ ]  TTHS, [ ]  Burning with urination, [ ]  Frequent urination, [ ]  Difficulty urinating;   Skin: No rashes  Psychological: No history of anxiety,  No history of depression   Physical Examination  Vitals:   01/18/18 1114 01/18/18 1117  BP: (!) 179/80 (!) 187/92  Pulse: 74   Resp: 20   Temp: 97.7 F (36.5 C)   SpO2: 100%   Weight: 178 lb (80.7 kg)   Height: 6' (1.829 m)     Body mass index is 24.14 kg/m.  General:  Alert and oriented, no acute distress HEENT: Facial deformity along the right nasal and maxillary portion of his face with well-healed right submandibular scar extending into the neck  neck: No bruit or JVD Pulmonary: Clear to auscultation bilaterally Cardiac: Regular Rate and Rhythm without murmur Abdomen: Soft, non-tender Skin: No rash Musculoskeletal: No edema , well-healed left medial forearm skin graft  neurologic: Upper and lower extremity motor 5/5 and symmetric, some facial  asymmetry from his operation  DATA:  Patient had a carotid duplex exam today which showed 60 to 80% right internal carotid artery stenosis with 338/108 velocities calcified lesion most likely greater than 80%, left side less than 40% antegrade vertebral flow  ASSESSMENT: Asymptomatic greater than 80% stenosis right internal carotid artery with prior neck incision and exploration for head and neck cancer   PLAN: Patient would be high risk for carotid endarterectomy due to his previous neck operation.  We will obtain a CT  Angie of the head and neck to see if he is a candidate for TCAR.  He will return for follow-up with me after his CT scan.  He will continue his aspirin and statin.  He will continue to try to quit smoking.   Ruta Hinds, MD [facility] Office: [phone] Pager: [phone]

## 2018-01-19 ENCOUNTER — Emergency Department (HOSPITAL_COMMUNITY)
Admission: EM | Admit: 2018-01-19 | Discharge: 2018-01-19 | Disposition: A | Discharge status: Home / Self Care | Payer: Medicaid Other | Attending: Emergency Medicine | Admitting: Emergency Medicine

## 2018-01-19 ENCOUNTER — Other Ambulatory Visit: Payer: Self-pay

## 2018-01-19 ENCOUNTER — Encounter (HOSPITAL_COMMUNITY): Payer: Self-pay

## 2018-01-19 DIAGNOSIS — Z79899 Other long term (current) drug therapy: Secondary | ICD-10-CM | POA: Diagnosis not present

## 2018-01-19 DIAGNOSIS — I130 Hypertensive heart and chronic kidney disease with heart failure and stage 1 through stage 4 chronic kidney disease, or unspecified chronic kidney disease: Secondary | ICD-10-CM | POA: Insufficient documentation

## 2018-01-19 DIAGNOSIS — Y658 Other specified misadventures during surgical and medical care: Secondary | ICD-10-CM | POA: Diagnosis not present

## 2018-01-19 DIAGNOSIS — I5032 Chronic diastolic (congestive) heart failure: Secondary | ICD-10-CM | POA: Insufficient documentation

## 2018-01-19 DIAGNOSIS — I2510 Atherosclerotic heart disease of native coronary artery without angina pectoris: Secondary | ICD-10-CM | POA: Diagnosis not present

## 2018-01-19 DIAGNOSIS — N183 Chronic kidney disease, stage 3 (moderate): Secondary | ICD-10-CM | POA: Diagnosis not present

## 2018-01-19 DIAGNOSIS — T839XXA Unspecified complication of genitourinary prosthetic device, implant and graft, initial encounter: Secondary | ICD-10-CM

## 2018-01-19 DIAGNOSIS — F17210 Nicotine dependence, cigarettes, uncomplicated: Secondary | ICD-10-CM | POA: Insufficient documentation

## 2018-01-19 DIAGNOSIS — T83031A Leakage of indwelling urethral catheter, initial encounter: Secondary | ICD-10-CM | POA: Insufficient documentation

## 2018-01-19 DIAGNOSIS — Z7982 Long term (current) use of aspirin: Secondary | ICD-10-CM | POA: Insufficient documentation

## 2018-01-19 NOTE — ED Triage Notes (Signed)
Pt says he's here because his leg bag is leaking  Pt requesting new leg bag.  Denies any complaints.

## 2018-01-19 NOTE — ED Notes (Signed)
Leg bag changed with new leg straps.

## 2018-01-19 NOTE — ED Provider Notes (Signed)
[facility] EMERGENCY DEPARTMENT Provider Note   CSN: [id number] Arrival date & time: 01/19/18  9562     History   Chief Complaint Chief Complaint  Patient presents with  . requesting foley bag changed    HPI Michael [name] is a 62 y.o. male with a history of urinary retention, currently has a chronic Foley catheter bag in place and is anticipating establishing care with urology next month presenting today for a new catheter bag as his leg bag started leaking last night.  He denies any dysuria, hematuria, fevers, chills, nausea or vomiting.  No other complaints today.  The history is provided by the patient.    Past Medical History:  Diagnosis Date  . Carotid artery disease ([place])   . Chronic diastolic heart failure ([place])   . CKD (chronic kidney disease) stage 3, GFR 30-59 ml/min (HCC)   . Essential hypertension   . Head and neck cancer ([place]) 2019   Right facial basal cell carcinoma s/p resection with right partial mastectomy and partal rhinectomy with skin graft (06/13/17)  . Iron deficiency anemia   . Urinary retention     Patient Active Problem List   Diagnosis Date Noted  . Urinary retention 11/27/2017  . Abnormal nuclear stress test 11/22/2017  . Complicated UTI (urinary tract infection)   . Sepsis ([place]) 09/17/2017  . Vasovagal syncope 09/17/2017  . Sepsis secondary to UTI ([place]) 09/01/2017  . Syncope 07/05/2017  . COPD exacerbation ([place]) 07/05/2017  . Acute renal failure superimposed on chronic kidney disease ([place]) 07/05/2017  . Catheter-associated urinary tract infection ([place]) 07/05/2017  . Leukocytosis 07/05/2017  . Urinary retention with incomplete bladder emptying 07/05/2017  . UTI (urinary tract infection) 07/05/2017  . Diarrhea 07/05/2017  . Carotid artery disease ([place]) 06/25/2017  . AKI (acute kidney injury) ([place])   . Orthostatic syncope   . Syncope due to orthostatic hypotension 06/24/2017  . Heart murmur 06/24/2017  . Abnormal EKG 06/24/2017  .  Abnormal PET scan of colon 05/25/2017  . 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 Periapical abscess 04/04/2017  . Iron (Fe) deficiency anemia 04/04/2017  . Essential hypertension 04/02/2017  . Anemia 04/02/2017  . Acute hypoxemic respiratory failure ([place]) 04/02/2017  . Tobacco abuse 04/02/2017  . Chronic diastolic CHF (congestive heart failure) ([place]) 04/02/2017  . Elevated troponin 04/02/2017  . Thrombocytosis ([place]) 04/02/2017  . Skin lesion of face   . Nasal lesion     Past Surgical History:  Procedure Laterality Date  . ANKLE CLOSED REDUCTION Right   . COLONOSCOPY N/A 05/31/2017   Procedure: COLONOSCOPY;  Surgeon: Daneil Dolin, MD;  [facility];  Service: Endoscopy;  Laterality: N/A;  2:45pm  . POLYPECTOMY  05/31/2017   Procedure: POLYPECTOMY;  Surgeon: Daneil Dolin, MD;  [facility];  Service: Endoscopy;;  . TONSILLECTOMY          Home Medications    Prior to Admission medications   Medication Sig Start Date End Date Taking? Authorizing Provider  aspirin EC 81 MG tablet Take 81 mg by mouth daily.    [provider]  atorvastatin (LIPITOR) 40 MG tablet Take 1 tablet (40 mg total) by mouth daily. 11/27/17 02/25/18  [name], PA-C  carvedilol (COREG) 12.5 MG tablet Take 1 tablet (12.5 mg total) by mouth 2 (two) times daily. 11/27/17 02/25/18  [name], PA-C  nitroGLYCERIN (NITROSTAT) 0.4 MG SL tablet Place 1 tablet (0.4 mg total) under the  tongue every 5 (five) minutes as needed for chest pain. 01/10/18 04/10/18  Satira Sark, MD    Family History Family History  Problem Relation Age of Onset  . Stroke Father   . Cirrhosis Mother   . Colon cancer Neg Hx     Social History Social History   Tobacco Use  . Smoking status: Current Every Day Smoker    Packs/day: 0.50    Years: 40.00    Pack years: 20.00    Types: Cigarettes  . Smokeless tobacco: Never Used  Substance Use Topics  . Alcohol  use: Not Currently    Frequency: Never  . Drug use: Never     Allergies   Patient has no known allergies.   Review of Systems Review of Systems  Constitutional: Negative for chills and fever.  HENT: Negative.   Eyes: Negative.   Respiratory: Negative for chest tightness and shortness of breath.   Cardiovascular: Negative for chest pain.  Gastrointestinal: Negative for abdominal pain, nausea and vomiting.  Genitourinary: Negative.  Negative for dysuria and hematuria.  Musculoskeletal: Negative for back pain.  Skin: Negative.  Negative for rash and wound.  Neurological: Negative.   Psychiatric/Behavioral: Negative.      Physical Exam Updated Vital Signs BP (!) 181/97   Pulse 78   Temp 97.8 F (36.6 C) (Oral)   Resp 18   Ht 6' (1.829 m)   Wt 80.7 kg   SpO2 100%   BMI 24.14 kg/m   Physical Exam Vitals signs and nursing note reviewed.  Constitutional:      Appearance: He is well-developed.  HENT:     Head: Normocephalic and atraumatic.  Neck:     Musculoskeletal: Normal range of motion.  Cardiovascular:     Rate and Rhythm: Normal rate.     Heart sounds: Normal heart sounds.     Comments: Blood pressure elevation was which is chronically elevated per review of chart.  He has not taken his Coreg this morning. Pulmonary:     Effort: Pulmonary effort is normal.     Breath sounds: No wheezing.  Genitourinary:    Comments: Deferred.  Foley bag replaced prior to my evaluation.   Musculoskeletal: Normal range of motion.  Skin:    General: Skin is warm and dry.  Neurological:     Mental Status: He is alert and oriented to person, place, and time.      ED Treatments / Results  Labs (all labs ordered are listed, but only abnormal results are displayed) Labs Reviewed - No data to display  EKG None  Radiology   Procedures Procedures (including critical care time)  Medications Ordered in ED Medications - No data to display   Initial Impression /  Assessment and Plan / ED Course  I have reviewed the triage vital signs and the nursing notes.  Pertinent labs & imaging results that were available during my care of the patient were reviewed by me and considered in my medical decision making (see chart for details).     Patient with simple need of replacement Foley catheter bag as his old 1 was leaking.  He has no other complaints or symptoms.  His bag was replaced and he was given a spare in case this new one does not last until his urology follow-up.  PRN follow-up anticipated.  Final Clinical Impressions(s) / ED Diagnoses   Final diagnoses:  Problem with Foley catheter, initial encounter [facility])    ED Discharge Orders  None       Evalee Jefferson, Hershal Coria 01/19/18 1115    Nat Christen, MD 01/19/18 [phone]

## 2018-02-13 ENCOUNTER — Ambulatory Visit (HOSPITAL_COMMUNITY)
Admission: RE | Admit: 2018-02-13 | Discharge: 2018-02-13 | Disposition: A | Discharge status: Home / Self Care | Payer: Medicaid Other | Source: Ambulatory Visit | Attending: Vascular Surgery | Admitting: Vascular Surgery

## 2018-02-13 DIAGNOSIS — I6521 Occlusion and stenosis of right carotid artery: Secondary | ICD-10-CM | POA: Diagnosis present

## 2018-02-13 DIAGNOSIS — I6529 Occlusion and stenosis of unspecified carotid artery: Secondary | ICD-10-CM | POA: Insufficient documentation

## 2018-02-13 LAB — POCT I-STAT CREATININE: Creatinine, Ser: 1.3 mg/dL — ABNORMAL HIGH (ref 0.61–1.24)

## 2018-02-13 MED ORDER — IOPAMIDOL (ISOVUE-370) INJECTION 76%
150.0000 mL | Freq: Once | INTRAVENOUS | Status: AC | PRN
  Administered 2018-02-13: 75 mL via INTRAVENOUS

## 2018-03-01 ENCOUNTER — Other Ambulatory Visit: Payer: Self-pay

## 2018-03-01 ENCOUNTER — Ambulatory Visit (INDEPENDENT_AMBULATORY_CARE_PROVIDER_SITE_OTHER): Payer: Medicaid Other | Admitting: Vascular Surgery

## 2018-03-01 ENCOUNTER — Encounter: Payer: Self-pay | Admitting: Vascular Surgery

## 2018-03-01 VITALS — BP 190/104 | HR 76 | Temp 97.9°F | Resp 20 | Ht 72.0 in | Wt 178.0 lb

## 2018-03-01 DIAGNOSIS — I6521 Occlusion and stenosis of right carotid artery: Secondary | ICD-10-CM | POA: Diagnosis not present

## 2018-03-01 MED ORDER — CLOPIDOGREL BISULFATE 75 MG PO TABS: 75.0000 mg | ORAL_TABLET | Freq: Every day | ORAL | 12 refills | Status: DC

## 2018-03-01 NOTE — Progress Notes (Signed)
Patient is a 63 year old male who returns for follow-up today.  He was noted on evaluation for a head and neck cancer to have a right internal carotid artery stenosis.  He continues to deny any symptoms of TIA amaurosis or stroke.  This lesion was first noted in March 2019.  Currently he is on aspirin.  He is also on a statin.  To treat his head and neck cancer he ended up with a right neck exploration.  He does not think he had any radiation.  Returns today for follow-up after recent CT Angio of the head and neck.  Current Outpatient Medications on File Prior to Visit  Medication Sig Dispense Refill  . aspirin EC 81 MG tablet Take 81 mg by mouth daily.    . nitroGLYCERIN (NITROSTAT) 0.4 MG SL tablet Place 1 tablet (0.4 mg total) under the tongue every 5 (five) minutes as needed for chest pain. 90 tablet 3  . atorvastatin (LIPITOR) 40 MG tablet Take 1 tablet (40 mg total) by mouth daily. 90 tablet 3  . carvedilol (COREG) 12.5 MG tablet Take 1 tablet (12.5 mg total) by mouth 2 (two) times daily. 180 tablet 3   No current facility-administered medications on file prior to visit.     Past Medical History:  Diagnosis Date  . Carotid artery disease ([place])   . Chronic diastolic heart failure ([place])   . CKD (chronic kidney disease) stage 3, GFR 30-59 ml/min (HCC)   . Essential hypertension   . Head and neck cancer ([place]) 2019   Right facial basal cell carcinoma s/p resection with right partial mastectomy and partal rhinectomy with skin graft (06/13/17)  . Iron deficiency anemia   . Urinary retention     Past Surgical History:  Procedure Laterality Date  . ANKLE CLOSED REDUCTION Right   . COLONOSCOPY N/A 05/31/2017   Procedure: COLONOSCOPY;  Surgeon: Daneil Dolin, MD;  [facility];  Service: Endoscopy;  Laterality: N/A;  2:45pm  . POLYPECTOMY  05/31/2017   Procedure: POLYPECTOMY;  Surgeon: Daneil Dolin, MD;  [facility];  Service: Endoscopy;;  . TONSILLECTOMY      Review of systems: He denies shortness of breath.  He denies chest pain.  Physical exam:  Vitals:   03/01/18 0952 03/01/18 0954  BP: (!) 192/95 (!) 190/104  Pulse: 76   Resp: 20   Temp: 97.9 F (36.6 C)   SpO2: 99%   Weight: 178 lb (80.7 kg)   Height: 6' (1.829 m)     Neck: No carotid bruit, well-healed right submandibular scar and skin graft right maxillary region  Neuro: Symmetric upper extremity lower extremity motor strength 5/5  Data: I reviewed the patient's CT angiogram images of the head and neck.  There is a 70% heavily calcified stenosis of the right internal carotid artery no significant left internal carotid artery stenosis  Assessment: Asymptomatic 70% right internal carotid artery stenosis heavily calcified not a candidate currently for carotid stenting due to heavy calcification.  He is a fairly poor candidate for carotid endarterectomy due to the prior head and neck surgery.  Plan: Patient had Plavix added to his antiplatelet regimen today.  He is currently on Plavix aspirin and a statin.  This will be maximal medical management.  He will refrain from tobacco use.  He will try to keep the blood pressure and check.  If he develops symptoms we would consider right carotid endarterectomy but this would be fairly high risk of  cranial nerve injury due to his prior head and neck surgery.  If he remains asymptomatic we will continue maximal medical management and observation for now.  Patient will follow-up with me in 6 months with a repeat carotid duplex exam  Of note he was hypertensive on exam today patient states he forgot to take his blood pressure medication this morning.  It was emphasized she should continue to take this daily.  Ruta Hinds, MD [facility] Office: [phone] Pager: [phone]

## 2018-04-11 NOTE — Progress Notes (Signed)
Cardiology Office Note  Date: 04/12/2018   ID: Michael [name], DOB 01/31/1956, MRN [id number]  PCP: [facility] Primary Cardiologist: Rozann Lesches, MD   Chief Complaint  Patient presents with  . Cardiac follow-up    History of Present Illness: Michael [name] is a 63 y.o. male that I met in December 2019.  He presents for a follow-up visit.  He has now established in the [facility] in [place] for primary care.  Since I saw him he stopped Coreg stating that it made him feel nauseous and lightheaded.  He does not report any angina symptoms or frank syncope.  Patient was seen by Dr. Oneida Alar with [facility] in January.  Carotid imaging studies were reviewed including CT angiography which demonstrated a 70% heavily calcified RICA stenosis and no significant left-sided disease.  He was not felt to be a candidate for carotid stenting and suboptimal candidate for endarterectomy. Plan is for medical therapy at this point.  He is on aspirin and Lipitor.  I talked with him today about considering low-dose bisoprolol.  We might be able to add a low-dose ARB depending on blood pressure for concurrent treatment of his mild cardiomyopathy.  He is also following with urology, Foley catheter is out.  Past Medical History:  Diagnosis Date  . Carotid artery disease ([place])   . Chronic diastolic heart failure ([place])   . CKD (chronic kidney disease) stage 3, GFR 30-59 ml/min (HCC)   . Essential hypertension   . Head and neck cancer ([place]) 2019   Right facial basal cell carcinoma s/p resection with right partial mastectomy and partal rhinectomy with skin graft (06/13/17)  . Iron deficiency anemia   . Urinary retention     Past Surgical History:  Procedure Laterality Date  . ANKLE CLOSED REDUCTION Right   . COLONOSCOPY N/A 05/31/2017   Procedure: COLONOSCOPY;  Surgeon: Daneil Dolin, MD;  [facility];  Service: Endoscopy;  Laterality: N/A;  2:45pm  . POLYPECTOMY   05/31/2017   Procedure: POLYPECTOMY;  Surgeon: Daneil Dolin, MD;  [facility];  Service: Endoscopy;;  . TONSILLECTOMY      Current Outpatient Medications  Medication Sig Dispense Refill  . aspirin EC 81 MG tablet Take 81 mg by mouth daily.    Marland Kitchen atorvastatin (LIPITOR) 40 MG tablet Take 1 tablet (40 mg total) by mouth daily. 90 tablet 3  . nitroGLYCERIN (NITROSTAT) 0.4 MG SL tablet Place 1 tablet (0.4 mg total) under the tongue every 5 (five) minutes as needed for chest pain. 90 tablet 3  . bisoprolol (ZEBETA) 5 MG tablet Take 0.5 tablets (2.5 mg total) by mouth daily. 45 tablet 3   No current facility-administered medications for this visit.    Allergies:  Patient has no known allergies.   Social History: The patient  reports that he has been smoking cigarettes. He has a 20.00 pack-year smoking history. He has never used smokeless tobacco. He reports previous alcohol use. He reports that he does not use drugs.   ROS:  Please see the history of present illness. Otherwise, complete review of systems is positive for none.  All other systems are reviewed and negative.   Physical Exam: VS:  BP (!) 156/82 (BP Location: Right Arm)   Pulse 80   Ht 6' (1.829 m)   Wt 179 lb (81.2 kg)   SpO2 98%   BMI 24.28 kg/m , BMI Body mass index is 24.28 kg/m.  Wt  Readings from Last 3 Encounters:  04/12/18 179 lb (81.2 kg)  03/01/18 178 lb (80.7 kg)  01/19/18 178 lb (80.7 kg)    General: Patient appears comfortable at rest. HEENT: Right facial scar/deformity stable after surgery. Neck: Supple, no elevated JVP, bilateral carotid bruits. Lungs: Clear to auscultation, nonlabored breathing at rest. Cardiac: Regular rate and rhythm, no S3, soft systolic murmur. Abdomen: Soft, nontender, bowel sounds present. Extremities: No pitting edema, distal pulses 2+. Skin: Warm and dry. Musculoskeletal: No kyphosis. Neuropsychiatric: Alert and oriented x3, affect grossly appropriate.  ECG: I  personally reviewed the tracing from 11/27/2017 which showed sinus rhythm with prolonged PR interval and IVCD.  Recent Labwork: 09/01/2017: ALT 26; AST 27 09/19/2017: Magnesium 2.0 10/01/2017: B Natriuretic Peptide 455.0; BUN 17; Hemoglobin 10.2; Platelets 347; Potassium 3.1; Sodium 141 02/13/2018: Creatinine, Ser 1.30     Component Value Date/Time   CHOL 194 04/02/2017 0541   TRIG 49 04/02/2017 0541   HDL 42 04/02/2017 0541   CHOLHDL 4.6 04/02/2017 0541   VLDL 10 04/02/2017 0541   LDLCALC 142 (H) 04/02/2017 0541    Other Studies Reviewed Today:  Echocardiogram 12/05/2017: Study Conclusions  - Left ventricle: The cavity size was normal. Wall thickness was increased in a pattern of mild LVH. Systolic function was mildly reduced. The estimated ejection fraction was in the range of 45% to 50%. There is hypokinesis of the basal-midinferolateral myocardium. Features are consistent with a pseudonormal left ventricular filling pattern, with concomitant abnormal relaxation and increased filling pressure (grade 2 diastolic dysfunction). - Aortic valve: Mildly calcified annulus. Trileaflet; mildly calcified leaflets. - Mitral valve: Mildly calcified annulus. There was mild regurgitation. - Left atrium: The atrium was mildly dilated. - Tricuspid valve: There was trivial regurgitation. - Pulmonary arteries: Systolic pressure could not be accurately estimated. - Pericardium, extracardiac: A trivial pericardial effusion was identified anterior to the heart.  Lexiscan Myoview 10/19/2017:  Findings consistent with prior inferior/septal myocardial infarction with mild peri-infarct ischemia.  This is a high risk study. Risk primarily based on decreased LVEF. There is a prior infarct with only mild current ischemia. Consider correlating LVEF with echocardiogram.  The left ventricular ejection fraction is moderately decreased (33%).  Carotid Dopplers 06/25/2017: IMPRESSION:  1. Bilateral carotid bifurcation and proximal ICA plaque resulting in 70-99% diameter right ICA stenosis, less than 50% diameter left ICA stenosis. Little change from previous study. 2. Antegrade bilateral vertebral arterial flow.  Assessment and Plan:  1.  Ischemic heart disease based on Myoview with inferior scar and mild peri-infarct ischemia.  He does not report any active angina and our plan is to continue medical therapy at this time.  He is on aspirin and Lipitor.  Start bisoprolol with concurrent mildly reduced LVEF in the 45 to 50% range.  If able, we may consider adding low-dose ARB next.  2.  Carotid artery disease with 70% heavily calcified RICA stenosis and no significant left-sided disease by CT angiography.  He is now following with Dr. Oneida Alar.  3.  Essential hypertension.  Adding low-dose bisoprolol.  Can titrate and/or add low-dose ARB next.  4.  Tobacco abuse, smoking cessation discussed.  Current medicines were reviewed with the patient today.  Disposition: Follow-up in 3 months.  Signed, Satira Sark, MD, [facility] 04/12/2018 11:33 AM    [facility] [address], [place], [place] [address] Phone: [phone]; Fax: [phone]

## 2018-04-12 ENCOUNTER — Ambulatory Visit (INDEPENDENT_AMBULATORY_CARE_PROVIDER_SITE_OTHER): Payer: Medicaid Other | Admitting: Cardiology

## 2018-04-12 ENCOUNTER — Encounter: Payer: Self-pay | Admitting: Cardiology

## 2018-04-12 ENCOUNTER — Other Ambulatory Visit: Payer: Self-pay

## 2018-04-12 VITALS — BP 156/82 | HR 80 | Ht 72.0 in | Wt 179.0 lb

## 2018-04-12 DIAGNOSIS — I10 Essential (primary) hypertension: Secondary | ICD-10-CM | POA: Diagnosis not present

## 2018-04-12 DIAGNOSIS — I6523 Occlusion and stenosis of bilateral carotid arteries: Secondary | ICD-10-CM | POA: Diagnosis not present

## 2018-04-12 DIAGNOSIS — I259 Chronic ischemic heart disease, unspecified: Secondary | ICD-10-CM | POA: Diagnosis not present

## 2018-04-12 DIAGNOSIS — Z720 Tobacco use: Secondary | ICD-10-CM | POA: Diagnosis not present

## 2018-04-12 MED ORDER — BISOPROLOL FUMARATE 5 MG PO TABS: 2.5000 mg | ORAL_TABLET | Freq: Every day | ORAL | 3 refills | Status: DC

## 2018-04-12 NOTE — Patient Instructions (Signed)
Medication Instructions:  Start bisoprolol 2.5 mg (1/2 tablet) daily   Labwork: none  Testing/Procedures: none  Follow-Up: Your physician recommends that you schedule a follow-up appointment in: 3 months   Any Other Special Instructions Will Be Listed Below (If Applicable).     If you need a refill on your cardiac medications before your next appointment, please call your pharmacy.

## 2018-04-13 ENCOUNTER — Telehealth: Payer: Self-pay

## 2018-04-13 MED ORDER — METOPROLOL SUCCINATE ER 25 MG PO TB24: 12.5000 mg | ORAL_TABLET | Freq: Every day | ORAL | 3 refills | Status: DC

## 2018-04-13 NOTE — Telephone Encounter (Signed)
Sent in medication to pharmacy.

## 2018-04-13 NOTE — Telephone Encounter (Signed)
-----   Message from Satira Sark, MD sent at 04/13/2018  9:53 AM EDT ----- Regarding: RE: [place] He did not tolerate Coreg.  Let's try Toprol XL 12.5 mg daily. Thanks. ----- Message ----- From: Drema Dallas, CMA Sent: 04/13/2018   9:41 AM EDT To: Satira Sark, MD Subject: ISSUE WITH MEDICAID                            The patient has medicaid insurance only and they will not cover bisoprolol. Is there an alternative you would consider?  Thanks for all you do! Vilinda Blanks.

## 2018-05-09 ENCOUNTER — Inpatient Hospital Stay (HOSPITAL_COMMUNITY)
Admission: EM | Admit: 2018-05-09 | Discharge: 2018-05-13 | DRG: 291 | Disposition: A | Discharge status: Home / Self Care | Payer: Medicaid Other | Attending: Family Medicine | Admitting: Family Medicine

## 2018-05-09 ENCOUNTER — Emergency Department (HOSPITAL_COMMUNITY): Payer: Medicaid Other

## 2018-05-09 ENCOUNTER — Other Ambulatory Visit: Payer: Self-pay

## 2018-05-09 ENCOUNTER — Encounter (HOSPITAL_COMMUNITY): Payer: Self-pay | Admitting: Emergency Medicine

## 2018-05-09 DIAGNOSIS — I6521 Occlusion and stenosis of right carotid artery: Secondary | ICD-10-CM | POA: Diagnosis present

## 2018-05-09 DIAGNOSIS — J9601 Acute respiratory failure with hypoxia: Secondary | ICD-10-CM | POA: Diagnosis present

## 2018-05-09 DIAGNOSIS — I10 Essential (primary) hypertension: Secondary | ICD-10-CM | POA: Diagnosis present

## 2018-05-09 DIAGNOSIS — Z85828 Personal history of other malignant neoplasm of skin: Secondary | ICD-10-CM | POA: Diagnosis not present

## 2018-05-09 DIAGNOSIS — I5032 Chronic diastolic (congestive) heart failure: Secondary | ICD-10-CM | POA: Diagnosis present

## 2018-05-09 DIAGNOSIS — J189 Pneumonia, unspecified organism: Secondary | ICD-10-CM | POA: Diagnosis not present

## 2018-05-09 DIAGNOSIS — R195 Other fecal abnormalities: Secondary | ICD-10-CM | POA: Diagnosis present

## 2018-05-09 DIAGNOSIS — Z9119 Patient's noncompliance with other medical treatment and regimen: Secondary | ICD-10-CM | POA: Diagnosis not present

## 2018-05-09 DIAGNOSIS — J181 Lobar pneumonia, unspecified organism: Secondary | ICD-10-CM

## 2018-05-09 DIAGNOSIS — I130 Hypertensive heart and chronic kidney disease with heart failure and stage 1 through stage 4 chronic kidney disease, or unspecified chronic kidney disease: Secondary | ICD-10-CM | POA: Diagnosis present

## 2018-05-09 DIAGNOSIS — F17210 Nicotine dependence, cigarettes, uncomplicated: Secondary | ICD-10-CM | POA: Diagnosis present

## 2018-05-09 DIAGNOSIS — Z20828 Contact with and (suspected) exposure to other viral communicable diseases: Secondary | ICD-10-CM | POA: Diagnosis present

## 2018-05-09 DIAGNOSIS — Z79899 Other long term (current) drug therapy: Secondary | ICD-10-CM | POA: Diagnosis not present

## 2018-05-09 DIAGNOSIS — Z823 Family history of stroke: Secondary | ICD-10-CM | POA: Diagnosis not present

## 2018-05-09 DIAGNOSIS — N183 Chronic kidney disease, stage 3 (moderate): Secondary | ICD-10-CM | POA: Diagnosis present

## 2018-05-09 DIAGNOSIS — R0603 Acute respiratory distress: Secondary | ICD-10-CM | POA: Diagnosis present

## 2018-05-09 DIAGNOSIS — E876 Hypokalemia: Secondary | ICD-10-CM | POA: Diagnosis present

## 2018-05-09 DIAGNOSIS — J440 Chronic obstructive pulmonary disease with acute lower respiratory infection: Secondary | ICD-10-CM | POA: Diagnosis present

## 2018-05-09 DIAGNOSIS — R7989 Other specified abnormal findings of blood chemistry: Secondary | ICD-10-CM | POA: Diagnosis not present

## 2018-05-09 DIAGNOSIS — D509 Iron deficiency anemia, unspecified: Secondary | ICD-10-CM | POA: Diagnosis present

## 2018-05-09 DIAGNOSIS — C4491 Basal cell carcinoma of skin, unspecified: Secondary | ICD-10-CM | POA: Diagnosis present

## 2018-05-09 DIAGNOSIS — J810 Acute pulmonary edema: Secondary | ICD-10-CM

## 2018-05-09 DIAGNOSIS — R778 Other specified abnormalities of plasma proteins: Secondary | ICD-10-CM | POA: Diagnosis present

## 2018-05-09 DIAGNOSIS — J1289 Other viral pneumonia: Secondary | ICD-10-CM | POA: Diagnosis present

## 2018-05-09 DIAGNOSIS — I472 Ventricular tachycardia: Secondary | ICD-10-CM | POA: Diagnosis not present

## 2018-05-09 DIAGNOSIS — I5022 Chronic systolic (congestive) heart failure: Secondary | ICD-10-CM | POA: Diagnosis present

## 2018-05-09 DIAGNOSIS — I161 Hypertensive emergency: Secondary | ICD-10-CM

## 2018-05-09 DIAGNOSIS — I5043 Acute on chronic combined systolic (congestive) and diastolic (congestive) heart failure: Secondary | ICD-10-CM | POA: Diagnosis present

## 2018-05-09 DIAGNOSIS — I5042 Chronic combined systolic (congestive) and diastolic (congestive) heart failure: Secondary | ICD-10-CM

## 2018-05-09 DIAGNOSIS — R0902 Hypoxemia: Secondary | ICD-10-CM

## 2018-05-09 HISTORY — DX: Chronic obstructive pulmonary disease, unspecified: J44.9

## 2018-05-09 LAB — COMPREHENSIVE METABOLIC PANEL
ALT: 19 U/L (ref 0–44)
AST: 27 U/L (ref 15–41)
Albumin: 3.6 g/dL (ref 3.5–5.0)
Alkaline Phosphatase: 121 U/L (ref 38–126)
Anion gap: 10 (ref 5–15)
BUN: 23 mg/dL (ref 8–23)
CO2: 22 mmol/L (ref 22–32)
Calcium: 9 mg/dL (ref 8.9–10.3)
Chloride: 106 mmol/L (ref 98–111)
Creatinine, Ser: 1.3 mg/dL — ABNORMAL HIGH (ref 0.61–1.24)
GFR calc Af Amer: >60 mL/min (ref >60)
GFR calc non Af Amer: 58 mL/min — ABNORMAL LOW (ref >60)
Glucose, Bld: 149 mg/dL — ABNORMAL HIGH (ref 70–99)
Potassium: 3.8 mmol/L (ref 3.5–5.1)
Sodium: 138 mmol/L (ref 135–145)
Total Bilirubin: 0.2 mg/dL — ABNORMAL LOW (ref 0.3–1.2)
Total Protein: 7.4 g/dL (ref 6.5–8.1)

## 2018-05-09 LAB — URINALYSIS, ROUTINE W REFLEX MICROSCOPIC
Bilirubin Urine: NEGATIVE
Glucose, UA: NEGATIVE mg/dL
Hgb urine dipstick: NEGATIVE
Ketones, ur: NEGATIVE mg/dL
Nitrite: POSITIVE — AB
Protein, ur: NEGATIVE mg/dL
Specific Gravity, Urine: 1.005 (ref 1.005–1.030)
pH: 6 (ref 5.0–8.0)

## 2018-05-09 LAB — CBC
HCT: 30.2 % — ABNORMAL LOW (ref 39.0–52.0)
Hemoglobin: 8.2 g/dL — ABNORMAL LOW (ref 13.0–17.0)
MCH: 18.1 pg — ABNORMAL LOW (ref 26.0–34.0)
MCHC: 27.2 g/dL — ABNORMAL LOW (ref 30.0–36.0)
MCV: 66.8 fL — ABNORMAL LOW (ref 80.0–100.0)
Platelets: 498 10*3/uL — ABNORMAL HIGH (ref 150–400)
RBC: 4.52 MIL/uL (ref 4.22–5.81)
RDW: 19.5 % — ABNORMAL HIGH (ref 11.5–15.5)
WBC: 11 10*3/uL — ABNORMAL HIGH (ref 4.0–10.5)
nRBC: 0 % (ref 0.0–0.2)

## 2018-05-09 LAB — RETICULOCYTES
Immature Retic Fract: 14.3 % (ref 2.3–15.9)
RBC.: 4.2 MIL/uL — ABNORMAL LOW (ref 4.22–5.81)
Retic Count, Absolute: 55 10*3/uL (ref 19.0–186.0)
Retic Ct Pct: 1.3 % (ref 0.4–3.1)

## 2018-05-09 LAB — CBC WITH DIFFERENTIAL/PLATELET
Abs Immature Granulocytes: 0.14 10*3/uL — ABNORMAL HIGH (ref 0.00–0.07)
Basophils Absolute: 0.1 10*3/uL (ref 0.0–0.1)
Basophils Relative: 1 %
Eosinophils Absolute: 0.2 10*3/uL (ref 0.0–0.5)
Eosinophils Relative: 1 %
HCT: 28 % — ABNORMAL LOW (ref 39.0–52.0)
Hemoglobin: 7.4 g/dL — ABNORMAL LOW (ref 13.0–17.0)
Immature Granulocytes: 1 %
Lymphocytes Relative: 8 %
Lymphs Abs: 1.5 10*3/uL (ref 0.7–4.0)
MCH: 17.7 pg — ABNORMAL LOW (ref 26.0–34.0)
MCHC: 26.4 g/dL — ABNORMAL LOW (ref 30.0–36.0)
MCV: 67 fL — ABNORMAL LOW (ref 80.0–100.0)
Monocytes Absolute: 1.1 10*3/uL — ABNORMAL HIGH (ref 0.1–1.0)
Monocytes Relative: 6 %
Neutro Abs: 15.6 10*3/uL — ABNORMAL HIGH (ref 1.7–7.7)
Neutrophils Relative %: 83 %
Platelets: 488 10*3/uL — ABNORMAL HIGH (ref 150–400)
RBC: 4.18 MIL/uL — ABNORMAL LOW (ref 4.22–5.81)
RDW: 18.4 % — ABNORMAL HIGH (ref 11.5–15.5)
WBC: 18.7 10*3/uL — ABNORMAL HIGH (ref 4.0–10.5)
nRBC: 0 % (ref 0.0–0.2)

## 2018-05-09 LAB — TROPONIN I
Troponin I: 0.25 ng/mL (ref <0.03)
Troponin I: 1.57 ng/mL (ref <0.03)

## 2018-05-09 LAB — RESPIRATORY PANEL BY PCR

## 2018-05-09 LAB — D-DIMER, QUANTITATIVE: D-Dimer, Quant: 2.81 ug/mL-FEU — ABNORMAL HIGH (ref 0.00–0.50)

## 2018-05-09 LAB — VITAMIN B12: Vitamin B-12: 218 pg/mL (ref 180–914)

## 2018-05-09 LAB — PREPARE RBC (CROSSMATCH)

## 2018-05-09 LAB — IRON AND TIBC
Iron: 11 ug/dL — ABNORMAL LOW (ref 45–182)
Saturation Ratios: 2 % — ABNORMAL LOW (ref 17.9–39.5)
TIBC: 513 ug/dL — ABNORMAL HIGH (ref 250–450)
UIBC: 502 ug/dL

## 2018-05-09 LAB — LACTIC ACID, PLASMA: Lactic Acid, Venous: 1.9 mmol/L (ref 0.5–1.9)

## 2018-05-09 LAB — INFLUENZA PANEL BY PCR (TYPE A & B)
Influenza A By PCR: NEGATIVE
Influenza B By PCR: NEGATIVE

## 2018-05-09 LAB — PROCALCITONIN: Procalcitonin: 0.46 ng/mL

## 2018-05-09 LAB — BRAIN NATRIURETIC PEPTIDE: B Natriuretic Peptide: 659 pg/mL — ABNORMAL HIGH (ref 0.0–100.0)

## 2018-05-09 LAB — HEPARIN LEVEL (UNFRACTIONATED): Heparin Unfractionated: 1.46 IU/mL — ABNORMAL HIGH (ref 0.30–0.70)

## 2018-05-09 LAB — FERRITIN: Ferritin: 8 ng/mL — ABNORMAL LOW (ref 24–336)

## 2018-05-09 LAB — STREP PNEUMONIAE URINARY ANTIGEN: Strep Pneumo Urinary Antigen: NEGATIVE

## 2018-05-09 LAB — C-REACTIVE PROTEIN: CRP: 0.9 mg/dL (ref <1.0)

## 2018-05-09 LAB — PROTIME-INR
INR: 1.1 (ref 0.8–1.2)
Prothrombin Time: 14.3 seconds (ref 11.4–15.2)

## 2018-05-09 LAB — CK TOTAL AND CKMB (NOT AT ARMC)
CK, MB: 12.2 ng/mL — ABNORMAL HIGH (ref 0.5–5.0)
Relative Index: 9.5 — ABNORMAL HIGH (ref 0.0–2.5)
Total CK: 129 U/L (ref 49–397)

## 2018-05-09 LAB — POC OCCULT BLOOD, ED: Fecal Occult Bld: POSITIVE — AB

## 2018-05-09 LAB — APTT: aPTT: 32 seconds (ref 24–36)

## 2018-05-09 LAB — LACTATE DEHYDROGENASE: LDH: 122 U/L (ref 98–192)

## 2018-05-09 LAB — MAGNESIUM: Magnesium: 2.2 mg/dL (ref 1.7–2.4)

## 2018-05-09 LAB — FOLATE: Folate: 10.1 ng/mL (ref >5.9)

## 2018-05-09 LAB — CK: Total CK: 140 U/L (ref 49–397)

## 2018-05-09 MED ORDER — ACETAMINOPHEN 325 MG PO TABS: 650.0000 mg | ORAL_TABLET | Freq: Once | ORAL | Status: DC

## 2018-05-09 MED ORDER — SODIUM CHLORIDE 0.9 % IV SOLN: 250.0000 mL | INTRAVENOUS | Status: DC | PRN

## 2018-05-09 MED ORDER — ENOXAPARIN SODIUM 80 MG/0.8ML ~~LOC~~ SOLN
1.0000 mg/kg | Freq: Once | SUBCUTANEOUS | Status: AC
  Administered 2018-05-09: 80 mg via SUBCUTANEOUS
  Filled 2018-05-09: qty 0.8

## 2018-05-09 MED ORDER — DIPHENHYDRAMINE HCL 25 MG PO CAPS
25.0000 mg | ORAL_CAPSULE | Freq: Once | ORAL | Status: DC
  Filled 2018-05-09 (×2): qty 1

## 2018-05-09 MED ORDER — ENOXAPARIN SODIUM 80 MG/0.8ML ~~LOC~~ SOLN
80.0000 mg | Freq: Two times a day (BID) | SUBCUTANEOUS | Status: DC
  Filled 2018-05-09 (×2): qty 0.8

## 2018-05-09 MED ORDER — HEPARIN (PORCINE) 25000 UT/250ML-% IV SOLN
950.0000 [IU]/h | INTRAVENOUS | Status: DC
  Administered 2018-05-09: 950 [IU]/h via INTRAVENOUS
  Filled 2018-05-09: qty 250

## 2018-05-09 MED ORDER — SODIUM CHLORIDE 0.9 % IV SOLN
500.0000 mg | INTRAVENOUS | Status: DC
  Administered 2018-05-09 – 2018-05-12 (×4): 500 mg via INTRAVENOUS
  Filled 2018-05-09 (×5): qty 500

## 2018-05-09 MED ORDER — SODIUM CHLORIDE 0.9% FLUSH: 3.0000 mL | INTRAVENOUS | Status: DC | PRN

## 2018-05-09 MED ORDER — CYANOCOBALAMIN 1000 MCG/ML IJ SOLN
1000.0000 ug | Freq: Once | INTRAMUSCULAR | Status: DC
  Filled 2018-05-09: qty 1

## 2018-05-09 MED ORDER — SODIUM CHLORIDE 0.9% IV SOLUTION: Freq: Once | INTRAVENOUS | Status: DC

## 2018-05-09 MED ORDER — SODIUM CHLORIDE 0.9% FLUSH
3.0000 mL | Freq: Two times a day (BID) | INTRAVENOUS | Status: DC
  Administered 2018-05-09 – 2018-05-11 (×3): 3 mL via INTRAVENOUS

## 2018-05-09 MED ORDER — FUROSEMIDE 10 MG/ML IJ SOLN
40.0000 mg | Freq: Once | INTRAMUSCULAR | Status: AC
  Administered 2018-05-09: 05:00:00 40 mg via INTRAVENOUS
  Filled 2018-05-09: qty 4

## 2018-05-09 MED ORDER — SODIUM CHLORIDE 0.9 % IV SOLN
2.0000 g | INTRAVENOUS | Status: DC
  Administered 2018-05-09 – 2018-05-11 (×3): 2 g via INTRAVENOUS
  Filled 2018-05-09 (×6): qty 20

## 2018-05-09 MED ORDER — METOPROLOL SUCCINATE ER 25 MG PO TB24: 12.5000 mg | ORAL_TABLET | Freq: Every day | ORAL | Status: DC

## 2018-05-09 MED ORDER — VANCOMYCIN HCL 1000 MG IV SOLR
INTRAVENOUS | Status: AC
  Filled 2018-05-09: qty 2000

## 2018-05-09 MED ORDER — NITROGLYCERIN 0.4 MG SL SUBL: 0.4000 mg | SUBLINGUAL_TABLET | SUBLINGUAL | Status: DC | PRN

## 2018-05-09 MED ORDER — METOPROLOL SUCCINATE ER 25 MG PO TB24
25.0000 mg | ORAL_TABLET | Freq: Every day | ORAL | Status: DC
  Administered 2018-05-09 – 2018-05-10 (×2): 25 mg via ORAL
  Filled 2018-05-09 (×2): qty 1

## 2018-05-09 MED ORDER — NITROGLYCERIN IN D5W 200-5 MCG/ML-% IV SOLN
INTRAVENOUS | Status: AC
  Administered 2018-05-09: 100 ug/min
  Filled 2018-05-09: qty 250

## 2018-05-09 MED ORDER — VANCOMYCIN HCL 10 G IV SOLR
1750.0000 mg | Freq: Once | INTRAVENOUS | Status: AC
  Administered 2018-05-09: 04:00:00 1750 mg via INTRAVENOUS
  Filled 2018-05-09: qty 1750

## 2018-05-09 MED ORDER — SODIUM CHLORIDE 0.9 % IV SOLN
2.0000 g | Freq: Once | INTRAVENOUS | Status: AC
  Administered 2018-05-09: 03:00:00 2 g via INTRAVENOUS
  Filled 2018-05-09: qty 2

## 2018-05-09 MED ORDER — ENOXAPARIN SODIUM 40 MG/0.4ML ~~LOC~~ SOLN: 40.0000 mg | SUBCUTANEOUS | Status: DC

## 2018-05-09 MED ORDER — FUROSEMIDE 10 MG/ML IJ SOLN
20.0000 mg | Freq: Once | INTRAMUSCULAR | Status: AC
  Administered 2018-05-09: 06:00:00 20 mg via INTRAVENOUS
  Filled 2018-05-09: qty 2

## 2018-05-09 MED ORDER — ASPIRIN EC 81 MG PO TBEC
81.0000 mg | DELAYED_RELEASE_TABLET | Freq: Every day | ORAL | Status: DC
  Administered 2018-05-09 – 2018-05-11 (×3): 81 mg via ORAL
  Filled 2018-05-09 (×3): qty 1

## 2018-05-09 MED ORDER — HEPARIN BOLUS VIA INFUSION
4000.0000 [IU] | Freq: Once | INTRAVENOUS | Status: AC
  Administered 2018-05-09: 08:00:00 4000 [IU] via INTRAVENOUS

## 2018-05-09 MED ORDER — VITAMIN B-12 1000 MCG PO TABS
1000.0000 ug | ORAL_TABLET | Freq: Every day | ORAL | Status: DC
  Administered 2018-05-09 – 2018-05-13 (×5): 1000 ug via ORAL
  Filled 2018-05-09 (×8): qty 1

## 2018-05-09 MED ORDER — ACETAMINOPHEN 325 MG PO TABS: 650.0000 mg | ORAL_TABLET | Freq: Four times a day (QID) | ORAL | Status: DC | PRN

## 2018-05-09 MED ORDER — FUROSEMIDE 10 MG/ML IJ SOLN
80.0000 mg | Freq: Once | INTRAMUSCULAR | Status: DC
  Filled 2018-05-09: qty 8

## 2018-05-09 MED ORDER — NITROGLYCERIN IN D5W 200-5 MCG/ML-% IV SOLN
0.0000 ug/min | INTRAVENOUS | Status: DC
  Administered 2018-05-09: 05:00:00 50 ug/min via INTRAVENOUS
  Administered 2018-05-09: 22:00:00 15 ug/min via INTRAVENOUS
  Administered 2018-05-11: 05:00:00 25 ug/min via INTRAVENOUS
  Filled 2018-05-09 (×2): qty 250

## 2018-05-09 NOTE — ED Provider Notes (Signed)
Emergency Department Provider Note   I have reviewed the triage vital signs and the nursing notes.   HISTORY  Chief Complaint Respiratory Distress   HPI Benno Brensinger is a 63 y.o. male with multiple medical problems as documented below who presents the emergency department today with dyspnea.  Patient states he has no symptoms and and woke up acutely in the [place] with significant respiratory distress and called 911.  He states he did not take his Lasix today or blood pressure medication.  On EMS arrival patient was reportedly 80% on room air and severe respiratory distress, cool, diaphoretic and clammy.  They started him on CPAP and brought him here for further evaluation.   Highest oxygen saturation he got in route was 87%.  No other associated or modifying symptoms.    Past Medical History:  Diagnosis Date   Carotid artery disease (HCC)    Chronic diastolic heart failure (HCC)    CKD (chronic kidney disease) stage 3, GFR 30-59 ml/min (HCC)    COPD (chronic obstructive pulmonary disease) (HCC)    Essential hypertension    Head and neck cancer ([place]) 2019   Right facial basal cell carcinoma s/p resection with right partial mastectomy and partal rhinectomy with skin graft (06/13/17)   Iron deficiency anemia    Urinary retention     Patient Active Problem List   Diagnosis Date Noted   CAP (community acquired pneumonia) 05/09/2018   Hypertensive emergency 05/09/2018   Urinary retention 11/27/2017   Abnormal nuclear stress test 18/29/9371   Complicated UTI (urinary tract infection)    Sepsis ([place]) 09/17/2017   Vasovagal syncope 09/17/2017   Sepsis secondary to UTI ([place]) 09/01/2017   Syncope 07/05/2017   COPD exacerbation ([place]) 07/05/2017   Acute renal failure superimposed on chronic kidney disease ([place]) 07/05/2017   Catheter-associated urinary tract infection ([place]) 07/05/2017   Leukocytosis 07/05/2017   Urinary retention with incomplete bladder  emptying 07/05/2017   UTI (urinary tract infection) 07/05/2017   Diarrhea 07/05/2017   Carotid artery disease ([place]) 06/25/2017   AKI (acute kidney injury) ([place])    Orthostatic syncope    Syncope due to orthostatic hypotension 06/24/2017   Heart murmur 06/24/2017   Abnormal EKG 06/24/2017   Abnormal PET scan of colon 05/25/2017   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 05/15/2017   Periapical abscess 04/04/2017   Iron (Fe) deficiency anemia 04/04/2017   Essential hypertension 04/02/2017   Anemia 04/02/2017   Acute hypoxemic respiratory failure ([place]) 04/02/2017   Tobacco abuse 04/02/2017   Chronic diastolic CHF (congestive heart failure) ([place]) 04/02/2017   Elevated troponin 04/02/2017   Thrombocytosis ([place]) 04/02/2017   Skin lesion of face    Nasal lesion     Past Surgical History:  Procedure Laterality Date   ANKLE CLOSED REDUCTION Right    COLONOSCOPY N/A 05/31/2017   Procedure: COLONOSCOPY;  Surgeon: Daneil Dolin, MD;  [facility];  Service: Endoscopy;  Laterality: N/A;  2:45pm   POLYPECTOMY  05/31/2017   Procedure: POLYPECTOMY;  Surgeon: Daneil Dolin, MD;  [facility];  Service: Endoscopy;;   TONSILLECTOMY      Current Outpatient Rx   Order #: [id number] Class: Historical Med   Order #: [id number] Class: Normal   Order #: [id number] Class: Normal   Order #: [id number] Class: Normal    Allergies Patient has no known allergies.  Family History  Problem Relation Age of Onset   Stroke Father    Cirrhosis Mother  Colon cancer Neg Hx     Social History Social History   Tobacco Use   Smoking status: Current Every Day Smoker    Packs/day: 0.50    Years: 40.00    Pack years: 20.00    Types: Cigarettes   Smokeless tobacco: Never Used  Substance Use Topics   Alcohol use: Not Currently    Frequency: Never   Drug use: Never    Review of Systems  All other systems negative except as documented in  the HPI. All pertinent positives and negatives as reviewed in the HPI. ____________________________________________   PHYSICAL EXAM:  VITAL SIGNS: ED Triage Vitals  Enc Vitals Group     BP 05/09/18 0100 (!) 183/93     Pulse Rate 05/09/18 0100 (!) 110     Resp 05/09/18 0100 (!) 37     Temp 05/09/18 0153 98.9 F (37.2 C)     Temp Source 05/09/18 0153 Rectal     SpO2 05/09/18 0100 100 %     Weight 05/09/18 0054 178 lb (80.7 kg)     Height 05/09/18 0054 6' (1.829 m)    Constitutional: Alert and oriented. Well appearing and in respiratory distress. Eyes: Conjunctivae are normal. PERRL. EOMI. Head: Atraumatic. Nose: not ble to assess 2/2 h/o MOHS Mouth/Throat: Mucous membranes are moist.  Oropharynx non-erythematous. Neck: No stridor.  No meningeal signs.   Cardiovascular: tachycardic rate, regular rhythm. Good peripheral circulation. Grossly normal heart sounds.   Respiratory: tachypneic, access muscle use, resp distress. Lungs with faint crackles.  Gastrointestinal: Soft and nontender. No distention.  Musculoskeletal: No lower extremity tenderness nor edema. No gross deformities of extremities. Neurologic:  Normal speech and language. No gross focal neurologic deficits are appreciated.  Skin:  Skin is cool, moist and intact. No rash noted.  ____________________________________________   LABS (all labs ordered are listed, but only abnormal results are displayed)  Labs Reviewed  COMPREHENSIVE METABOLIC PANEL - Abnormal; Notable for the following components:      Result Value   Glucose, Bld 149 (*)    Creatinine, Ser 1.30 (*)    Total Bilirubin 0.2 (*)    GFR calc non Af Amer 58 (*)    All other components within normal limits  CBC WITH DIFFERENTIAL/PLATELET - Abnormal; Notable for the following components:   WBC 18.7 (*)    RBC 4.18 (*)    Hemoglobin 7.4 (*)    HCT 28.0 (*)    MCV 67.0 (*)    MCH 17.7 (*)    MCHC 26.4 (*)    RDW 18.4 (*)    Platelets 488 (*)     Neutro Abs 15.6 (*)    Monocytes Absolute 1.1 (*)    Abs Immature Granulocytes 0.14 (*)    All other components within normal limits  BRAIN NATRIURETIC PEPTIDE - Abnormal; Notable for the following components:   B Natriuretic Peptide 659.0 (*)    All other components within normal limits  TROPONIN I - Abnormal; Notable for the following components:   Troponin I 0.25 (*)    All other components within normal limits  IRON AND TIBC - Abnormal; Notable for the following components:   Iron 11 (*)    TIBC 513 (*)    Saturation Ratios 2 (*)    All other components within normal limits  FERRITIN - Abnormal; Notable for the following components:   Ferritin 8 (*)    All other components within normal limits  RETICULOCYTES - Abnormal; Notable for the  following components:   RBC. 4.20 (*)    All other components within normal limits  POC OCCULT BLOOD, ED - Abnormal; Notable for the following components:   Fecal Occult Bld POSITIVE (*)    All other components within normal limits  CULTURE, BLOOD (ROUTINE X 2)  CULTURE, BLOOD (ROUTINE X 2)  MAGNESIUM  LACTIC ACID, PLASMA  INFLUENZA PANEL BY PCR (TYPE A & B)  VITAMIN B12  FOLATE  LACTIC ACID, PLASMA  OCCULT BLOOD X 1 CARD TO LAB, STOOL  INTERLEUKIN-6, PLASMA  C-REACTIVE PROTEIN  D-DIMER, QUANTITATIVE (NOT AT [facility])  PROTIME-INR  APTT  CK TOTAL AND CKMB (NOT AT [facility])   ____________________________________________  EKG   EKG Interpretation  Date/Time:  Wednesday May 09 2018 01:05:33 EDT Ventricular Rate:  111 PR Interval:    QRS Duration: 118 QT Interval:  389 QTC Calculation: 529 R Axis:   180 Text Interpretation:  Ectopic atrial tachycardia, unifocal Ventricular premature complex Left atrial enlargement Nonspecific intraventricular conduction delay Anterior infarct, old Repol abnrm suggests ischemia, lateral leads Borderline ST elevation, lateral leads multiple changes compared to previous Confirmed by Merrily Pew [id number]) on  05/09/2018 1:19:53 AM       EKG Interpretation  Date/Time:  Wednesday May 09 2018 03:29:36 EDT Ventricular Rate:  91 PR Interval:    QRS Duration: 111 QT Interval:  410 QTC Calculation: 505 R Axis:   161 Text Interpretation:  Sinus rhythm Borderline prolonged PR interval Right axis deviation Abnormal lateral Q waves Anteroseptal infarct, old Minimal ST depression, lateral leads Prolonged QT interval Some ST changes similar to september 2019, improved since earlier today Confirmed by Merrily Pew [id number]) on 05/09/2018 3:59:56 AM      ____________________________________________  RADIOLOGY  Dg Chest Portable 1 View  Result Date: 05/09/2018 CLINICAL DATA:  Initial evaluation for acute shortness of breath. History of COPD, hypertension, smoking. EXAM: PORTABLE CHEST 1 VIEW COMPARISON:  Prior radiograph from 10/01/2017 FINDINGS: Mild cardiomegaly. Mediastinal silhouette normal. Lungs mildly hypoinflated. Bibasilar consolidative opacities, right greater than left, concerning for multifocal pneumonia. Underlying mild diffuse pulmonary interstitial congestion. No pleural effusion. No pneumothorax. No acute osseous finding. IMPRESSION: 1. Bibasilar consolidative opacities, right greater than left, concerning for multifocal pneumonia. 2. Underlying mild diffuse pulmonary interstitial edema. Electronically Signed   By: Jeannine Boga M.D.   On: 05/09/2018 02:08   ____________________________________________   PROCEDURES  Procedure(s) performed:   .Critical Care Performed by: Merrily Pew, MD Authorized by: Merrily Pew, MD   Critical care provider statement:    Critical care time (minutes):  45   Critical care was necessary to treat or prevent imminent or life-threatening deterioration of the following conditions:  Respiratory failure and cardiac failure   Critical care was time spent personally by me on the following activities:  Discussions with consultants, evaluation of  patient's response to treatment, examination of patient, ordering and performing treatments and interventions, ordering and review of laboratory studies, ordering and review of radiographic studies, pulse oximetry, re-evaluation of patient's condition, obtaining history from patient or surrogate and review of old charts   I assumed direction of critical care for this patient from another provider in my specialty: no       ____________________________________________   INITIAL IMPRESSION / ASSESSMENT AND PLAN / ED COURSE  Vasil Juhasz was evaluated in Emergency Department on 05/09/2018 for the symptoms described in the history of present illness. He was evaluated in the context of the global COVID-19 pandemic, which necessitated consideration that the patient  might be at risk for infection with the SARS-CoV-2 virus that causes COVID-19. Institutional protocols and algorithms that pertain to the evaluation of patients at risk for COVID-19 are in a state of rapid change based on information released by regulatory bodies including the CDC and federal and state organizations. These policies and algorithms were followed during the patient's care in the ED.   Patient is initial presentation consistent with acute flash pulmonary edema with hypoxic respiratory failure.  No chest pain to suggest that he had a cardiac event however his EKG did show LVH with some repull changes.  His chest x-ray reviewed by me and also radiology did look to get interstitial edema but questionable right lower lobe opacity as well so started on antibiotics.  Patient on a nitroglycerin drip.  Secondary to the current pandemic of covid, was not continued on BiPAP.  Patient's labs reviewed by me showed elevated troponin and BNP thought to be secondary to the pulmonary edema.  His white blood cell count was elevated as well which could be related to the same but also since he had a questionable right lower lobe opacity on his x-ray I  went ahead and started on antibiotics.  He was found to have a hemoglobin of 7 and Hemoccult positive.  Reevaluated multiple times and progressively improving dyspnea and decreasing oxygen requirement while in the nitroglycerin drip.  Overall appears well.  Will discuss with hospitalist for admission.  Low suspicion for an coronavirus without any progressively worsening fever and cough at this time.     Pertinent labs & imaging results that were available during my care of the patient were reviewed by me and considered in my medical decision making (see chart for details).   FINAL CLINICAL IMPRESSION(S) / ED DIAGNOSES  Final diagnoses:  Hypoxia  Acute pulmonary edema (HCC)     MEDICATIONS GIVEN DURING THIS VISIT:  Medications  furosemide (LASIX) injection 80 mg (has no administration in time range)  vancomycin (VANCOCIN) 1,750 mg in sodium chloride 0.9 % 500 mL IVPB (has no administration in time range)  nitroGLYCERIN 0.2 mg/mL in dextrose 5 % infusion (50 mcg/min  Rate/Dose Change 05/09/18 0139)  ceFEPIme (MAXIPIME) 2 g in sodium chloride 0.9 % 100 mL IVPB (0 g Intravenous Stopped 05/09/18 0404)     NEW OUTPATIENT MEDICATIONS STARTED DURING THIS VISIT:  New Prescriptions   No medications on file    Note:  This note was prepared with assistance of Dragon voice recognition software. Occasional wrong-word or sound-a-like substitutions may have occurred due to the inherent limitations of voice recognition software.   Marcus Groll, Corene Cornea, MD 05/09/18 [phone]

## 2018-05-09 NOTE — ED Notes (Signed)
CRITICAL VALUE ALERT  Critical Value:  Trop 0.25  Date & Time Notied:  0311   Provider Notified: Dr. Dayna Barker  Orders Received/Actions taken: See chart

## 2018-05-09 NOTE — ED Notes (Signed)
Patient tolerating nasal/mouth canula at 6L with no difficulty. O2 saturation 98%, respirations 17 bpm.

## 2018-05-09 NOTE — ED Notes (Signed)
Lab at bedside drawing blood cultures 

## 2018-05-09 NOTE — H&P (Addendum)
History and Physical  Michael [name] KPT:[id number] DOB: October 03, 1955 DOA: 05/09/2018  Referring physician:  Dorise Bullion          PCP: [name] GI: Dr. Sydell Axon Cardiologist: Rozann Lesches  Chief Complaint:  Dyspnea  HPI: Michael [name] is a 63 y.o. male with hypertension, hyperlipidemia, diastolic CHF, CKD stage2, basal cell carcinoma s/p partial rhinectomy and maxillectomy 06/06/2017 at [facility], h/o urinary retention, h/o multiple polyps (adenoma/ tubulovillous adenoma on colonscopy 05/31/2017),  apparently presents with c/o waking up tonight with acute dyspnea.  Pt was brought from home by EMS.  Pt lives at home alone.  Pt denies any sick contact, or recent travel.  Pt has dry cough. Pt denies fever, cp, palp, n/v, orthopnea, pnd, lower ext edema, abdominal pain, diarrhea, brbpr, black stool.    Per EMS , pt was hypertensive, and dyspneic.  Pt was started on Cpap en route per ED.    In ED, pt initially started on Bipap and then there was some concern by ED for covid due to cough and CXR => Bibasilar consolidative opacities, right greater than left  Concerning for multifocal pneumonia, with underlying mild diffuse pulmonary interstitial edema. Pt was then placed on 6L o2 [place] with o2 sat >90%.    Wbc 18.7, hgb 7.4 (prev 10.2), Mcv 67, Rdw 18.4,  Plt 488.   Troponin 0.25 (baseline 0.03-0.04) BNP 659  EKG nsr at 90, borderline 1st degree AVB, RAD, Q in v1-3, st depression v5,6.  Pt will be admitted for multifocal pneumonia (CAP) and possible concomittant CHF as well as r/o low risk covid patient     Review of Systems: All systems reviewed and apart from history of presenting illness, are negative.      Past Medical History:  Diagnosis Date  . Acute urinary retention    followed by OP urology   . Carotid stenosis   . CHF (congestive heart failure) (HCC)    LVEF 50-55% 03/2017  . CKD (chronic kidney disease)    Stage II  . Head and neck cancer ([place]) 2019    s/p right facial basal cell carcinoma s/p resection with right partial mastectomy and partal rhinectomy with skin graft (06/13/17)  . HTN (hypertension)   . Iron deficiency anemia   . Tobacco abuse         Past Surgical History:  Procedure Laterality Date  . ANKLE CLOSED REDUCTION Right   . COLONOSCOPY N/A 05/31/2017   Procedure: COLONOSCOPY;  Surgeon: Daneil Dolin, MD;  Location: [facility];  Service: Endoscopy;  Laterality: N/A;  2:45pm  . POLYPECTOMY  05/31/2017   Procedure: POLYPECTOMY;  Surgeon: Daneil Dolin, MD;  Location: [facility] END[facility] SU[facility];  Service: Endoscopy;;  . TONSILLECTOMY     Social History:  reports that he has been smoking cigarettes. He has a 4.00 pack-year smoking history. He has never used smokeless tobacco. He reports that he drank alcohol. He reports that he does not use drugs.       Family History  Problem Relation Age of Onset  . Stroke Father   . Cirrhosis Mother   . Colon cancer Neg Hx     No Known Allergies Medications Aspirin 81mg  po qday Lipitor 40mg  po qhs Toprol XL 25mg  1/2 po qday Slg nitro 0.4mg  po q9minutes upto 3 doses prn CP   Physical Exam:  VS T 98.9 P 89  R 23  Bp 156/86 (sbp 183 on admission),  Pox 100% on 6L [place]  Wt 80.7 kg   General exam: Moderately built and nourished patient, lying comfortably supine on the gurney in no obvious distress.  Head, eyes and ENT: Nontraumatic and normocephalic. Pupils equally reacting to light and accommodation. Oral mucosa moist.  Neck: Supple. No JVD, carotid bruit or thyromegaly.  Lymphatics: No lymphadenopathy.  Respiratory system: slight crackles bilateral bases, no wheezing  Cardiovascular system: RRR s1, s2, 1/6 sem rusb  Gastrointestinal system: Abdomen is nondistended, soft and nontender. Normal bowel sounds heard. No organomegaly or masses appreciated.  Genitourinary system:    Central nervous system: Alert and oriented. No focal neurological deficits.   Extremities: Symmetric 5 x 5 power. Peripheral pulses symmetrically felt.   Skin: No rashes or acute findings, evidence of prior surgery to face along nose.  Musculoskeletal system: Negative exam.  Psychiatry: Pleasant and cooperative.  Labs on Admission:   Na 138, K 3.8, Bun 23, Creatinine 1.3 Alb 3.6 Ast 27, Alt 19 Glucose 149  Wbc 18.7, Hgb 7.4, Plt 488 B12 218 Ferritin 8 INR 1.1 FOBT positive  BNP 659 Trop 0.25  Influenza panel negative Blood culture x2 pending IL 6 pending Crp pending Covid test pending D dimer 2.81    CXR IMPRESSION: 1. Bibasilar consolidative opacities, right greater than left, concerning for multifocal pneumonia. 2. Underlying mild diffuse pulmonary interstitial edema.   Assessment/Plan   Acute respiratory failure secondary to Multifocal pneumonia, CAP, low risk covid and diastolic CHF Check Urine legionella, urine strep antigen Blood culture x2 pending Covid-19 test pending Check IL-6, D dimer, CRP vanco / cefepime iv in ED Start rocephin 2gm iv qday, zithromax 500mg  iv qday  Troponin Elevation (likely demand)/ Diastolic CHF Check trop I q6h x3 Check cardiac echo Continue aspirin 81mg  po qday Cont Lipitor 40mg  po qhs Lasix 40mg  iv x1 in ED Start Lasix 20mg  po qday  Consider Cardiology consult   Hypertensive Emergency (sbp 180+ per ED) Nitro GTT  Iron / b12 deficiency anemia w h/o multiple polyps (adenoma, tubulovillous adenoma) Start b12 1000 micrograms im x1 and 1000 micrograms po qday Transfuse 1 unit prbc Consider GI consultation, or outpatient follow up for a repeat colonoscopy  H/o Copd, Tobacco abuse Pt counselled on smoking cessation May need nicotine patch   DVT Prophylaxis: Lovenox Code Status: Full Family Communication: Patient Disposition Plan:  home  Pt will require inpatient admission due to complexity of his admission, pt has multifocal pneumonia, Diastolic CHF, needs covid 19 rule out as well as  severe iron deficiency anemia with Hgb 7.4  Time spent: 70 minutes  Addendum: repeat trop 1.57 lovenox 1mg / kg  x1 Please follow up on next trop  Michael Gravel  MD [facility] Pager [phone]    If 7PM-7AM, please contact night-coverage [email or web address] Password [facility] 05/09/2018  [id number]

## 2018-05-09 NOTE — ED Notes (Signed)
CRITICAL VALUE ALERT  Critical Value:  Trop 1.57  Date & Time Notied:  05/09/2018 0529  Provider Notified: Dr. Maudie Mercury  Orders Received/Actions taken: See chart

## 2018-05-09 NOTE — Progress Notes (Signed)
Pt admitted via [facility] from [facility] ER.  Oriented to unit, safety concerns, Covid R/O and routine discussed, pt verbalizes understanding.  Pt refused to keep BP cuff on arm for auto pressures, and refused 02 probe, will check both on every entry into room

## 2018-05-09 NOTE — TOC Initial Note (Signed)
Transition of Care [facility]) - Initial/Assessment Note    Patient Details  Name: Michael [name] MRN: [id number] Date of Birth: Nov 17, 1955  Transition of Care [facility]) CM/SW Contact:    Carles Collet, RN Phone Number: 05/09/2018, 1:57 PM  Clinical Narrative:                Damaris Schooner w patient over the phone. He states that he is from home alone. He can drive but does not have a car. He states that he relies ob his neighbor who is his landlord for transportation and that he is reliable. He denies difficulties getting his medications. He does not use DME for ambulation or respiratory support prior to admission.   PCP: [facility]     Expected Discharge Plan: Home/Self Care Barriers to Discharge: Continued Medical Work up   Patient Goals and CMS Choice Patient states their goals for this hospitalization and ongoing recovery are:: return home      Expected Discharge Plan and Services Expected Discharge Plan: Home/Self Care       Living arrangements for the past 2 months: Single Family Home                          Prior Living Arrangements/Services Living arrangements for the past 2 months: Single Family Home Lives with:: Self Patient language and need for interpreter reviewed:: Yes Do you feel safe going back to the place where you live?: Yes            Criminal Activity/Legal Involvement Pertinent to Current Situation/Hospitalization: No - Comment as needed  Activities of Daily Living      Permission Sought/Granted                  Emotional Assessment              Admission diagnosis:  Acute pulmonary edema ([place]) [J81.0] Hypoxia [R09.02] Acute on chronic combined systolic and diastolic congestive heart failure ([place]) [I50.43] Patient Active Problem List   Diagnosis Date Noted  . CAP (community acquired pneumonia) 05/09/2018  . Hypertensive emergency 05/09/2018  . Acute on chronic combined systolic and diastolic CHF (congestive heart failure)  ([place]) 05/09/2018  . Lobar pneumonia ([place]) 05/09/2018  . Acute on chronic combined systolic and diastolic congestive heart failure ([place]) 05/09/2018  . Urinary retention 11/27/2017  . Abnormal nuclear stress test 11/22/2017  . Complicated UTI (urinary tract infection)   . Sepsis ([place]) 09/17/2017  . Vasovagal syncope 09/17/2017  . Sepsis secondary to UTI ([place]) 09/01/2017  . Syncope 07/05/2017  . COPD exacerbation ([place]) 07/05/2017  . Acute renal failure superimposed on chronic kidney disease ([place]) 07/05/2017  . Catheter-associated urinary tract infection ([place]) 07/05/2017  . Leukocytosis 07/05/2017  . Urinary retention with incomplete bladder emptying 07/05/2017  . UTI (urinary tract infection) 07/05/2017  . Diarrhea 07/05/2017  . Carotid artery disease ([place]) 06/25/2017  . AKI (acute kidney injury) ([place])   . Orthostatic syncope   . Syncope due to orthostatic hypotension 06/24/2017  . Heart murmur 06/24/2017  . Abnormal EKG 06/24/2017  . Abnormal PET scan of colon 05/25/2017  . 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 Periapical abscess 04/04/2017  . Iron (Fe) deficiency anemia 04/04/2017  . Essential hypertension 04/02/2017  . Anemia 04/02/2017  . Acute hypoxemic respiratory failure ([place]) 04/02/2017  . Tobacco abuse 04/02/2017  . Chronic diastolic CHF (congestive heart failure) ([place]) 04/02/2017  . Elevated troponin 04/02/2017  .  Thrombocytosis ([place]) 04/02/2017  . Skin lesion of face   . Nasal lesion    PCP:  Patient, No Pcp Per Pharmacy:   [place], [place] [address] Phone: [phone] Fax: [phone]     Social Determinants of Health (SDOH) Interventions    Readmission Risk Interventions No flowsheet data found.

## 2018-05-09 NOTE — Progress Notes (Addendum)
ANTICOAGULATION CONSULT NOTE - Initial Consult  Pharmacy Consult for  Heparin dosing Indication:  ACS/STEMI  No Known Allergies  Patient Measurements: Height: 6' (182.9 cm) Weight: 178 lb (80.7 kg) IBW/kg (Calculated) : 77.6 Heparin Dosing Weight:  HEPARIN DW (KG): 80.7  Vital Signs: Temp: 97.7 F (36.5 C) (04/08 0657) Temp Source: Oral (04/08 0657) BP: 125/77 (04/08 0715) Pulse Rate: 85 (04/08 0715)  Labs: Recent Labs    05/09/18 0135 05/09/18 0234 05/09/18 0455  HGB 7.4*  --   --   HCT 28.0*  --   --   PLT 488*  --   --   APTT  --  32  --   LABPROT  --  14.3  --   INR  --  1.1  --   CREATININE 1.30*  --   --   TROPONINI 0.25*  --  1.57*    Estimated Creatinine Clearance: 63.8 mL/min (A) (by C-G formula based on SCr of 1.3 mg/dL (H)).   Medical History: Past Medical History:  Diagnosis Date  . Carotid artery disease ([place])   . Chronic diastolic heart failure ([place])   . CKD (chronic kidney disease) stage 3, GFR 30-59 ml/min (HCC)   . COPD (chronic obstructive pulmonary disease) ([place])   . Essential hypertension   . Head and neck cancer ([place]) 2019   Right facial basal cell carcinoma s/p resection with right partial mastectomy and partal rhinectomy with skin graft (06/13/17)  . Iron deficiency anemia   . Urinary retention      Assessment:  Pharmacy consulted to dose heparin for this 63 yo male with chest pain and elevated troponin I and D-Dimer.  Hemoglobin and hematocrit seem low at this time (H/H: 7.4/28) .He hasn't been on any anti-coagulation prior to admission except for low-dose aspirin.   Goal of Therapy:  Heparin level 0.3-0.7 units/ml Monitor platelets by anticoagulation protocol: Yes   Plan:  Give 4000 units bolus x 1 Start heparin infusion at 950 units/hr Check anti-Xa level in 6-8 hours and daily while on heparin Continue to monitor H&H and platelets  Despina Pole 05/09/2018,7:48 AM

## 2018-05-09 NOTE — ED Triage Notes (Signed)
Patient brought in by EMS for complaint of shortness of breath upon awakening tonight.

## 2018-05-09 NOTE — ED Notes (Signed)
Dr Dayna Barker at bedside. Patient's O2 saturation 100% on non-rebreather. Verbal order to switch O2 to 6L via nasal canula. Patient has had nasal surgery and unable to place canula in nares. Canula placed in patient's mouth at 6L.

## 2018-05-09 NOTE — Progress Notes (Signed)
ANTICOAGULATION CONSULT NOTE - Follow-Up Consult  Pharmacy Consult for  Heparin >> enoxaparin Indication:  ACS/STEMI  No Known Allergies  Patient Measurements: Height: 6' (182.9 cm) Weight: 182 lb 1.6 oz (82.6 kg) IBW/kg (Calculated) : 77.6 Heparin Dosing Weight:  HEPARIN DW (KG): 82.6  Vital Signs: Temp: 97.5 F (36.4 C) (04/08 1115) Temp Source: Oral (04/08 1115) BP: 141/72 (04/08 1504) Pulse Rate: 83 (04/08 1115)  Labs: Recent Labs    05/09/18 0135 05/09/18 0234 05/09/18 0455 05/09/18 0850  HGB 7.4*  --   --  8.2*  HCT 28.0*  --   --  30.2*  PLT 488*  --   --  498*  APTT  --  32  --   --   LABPROT  --  14.3  --   --   INR  --  1.1  --   --   HEPARINUNFRC  --   --   --  1.46*  CREATININE 1.30*  --   --   --   CKTOTAL  --  129  --  140  CKMB  --  12.2*  --   --   TROPONINI 0.25*  --  1.57*  --     Estimated Creatinine Clearance: 63.8 mL/min (A) (by C-G formula based on SCr of 1.3 mg/dL (H)).   Medical History: Past Medical History:  Diagnosis Date  . Carotid artery disease ([place])   . Chronic diastolic heart failure ([place])   . CKD (chronic kidney disease) stage 3, GFR 30-59 ml/min (HCC)   . COPD (chronic obstructive pulmonary disease) ([place])   . Essential hypertension   . Head and neck cancer ([place]) 2019   Right facial basal cell carcinoma s/p resection with right partial mastectomy and partal rhinectomy with skin graft (06/13/17)  . Iron deficiency anemia   . Urinary retention      Assessment: 47 yoM admitted with CP and positive troponins. Pharmacy initially consulted to start IV heparin for ACS, however given pt has COVID-19 testing in process will transition to enoxaparin to limit lab draws.   Upon chart review, pt actually received therapeutic dose of enoxaparin at 0600 prior to start of IV heparin at 0800, so pt would not be due for dose until 1800 tonight. However, given that pt has remained on IV heparin since initial dose of enoxaparin, he will likely  be thoroughly anticoagulated for a longer than usual time once the heparin infusion is discontinued. Therefore, will stop IV heparin NOW and give next dose of enoxaparin at 2000 tonight to allow further heparin washout.   Goal of Therapy:  Anti-Xa level 0.6-1 units/ml 4hrs after LMWH dose given Monitor platelets by anticoagulation protocol: Yes   Plan:  -Stop IV heparin immediately -Resume enoxaparin 80mg  SQ q12h starting at [place] -F/U cardiology workup   Arrie Senate, PharmD, BCPS Clinical Pharmacist [phone] Please check AMION for all [place] numbers 05/09/2018

## 2018-05-09 NOTE — Progress Notes (Signed)
Due to rule out coronavirus rule out and in the context of PPE conservation and minimizing exposure risk (for lab draws and drip adjustment), d/c IV heparin and restart lovenox [place].  Discussed with PharmD.  DTat

## 2018-05-09 NOTE — ED Notes (Signed)
[facility] arrived to transport patient to [facility].

## 2018-05-09 NOTE — Progress Notes (Signed)
Pt placed on BIPAP via vent 10/5 with 100% for SOB and increased WOB increased HR and RR. bs clear.. per MD pt taken off BIPAP and placed on 100% NRB for trial to see if pt maintain spo2 hr 111 rr 28 spo2 100% will hold off on putting back on BIPAP for now

## 2018-05-09 NOTE — ED Notes (Signed)
Signature pad not working in room. Pt verbalized consent to be transferred to [facility] with this RN as a witness.

## 2018-05-09 NOTE — ED Notes (Signed)
Verbal order by Dr Dayna Barker for nitroglycerin drip to start at 100 mcg/min. Dr Dayna Barker remaining at bedside.

## 2018-05-09 NOTE — ED Notes (Addendum)
Patient able to speak in full sentences with no difficulty at this time. Respirations 27 bpm. Patient remains on 10L via non-rebreather. Occasional non-productive cough that patient states is normal for him, states "it's a dry cough I have all the time." Denies pain.

## 2018-05-09 NOTE — Progress Notes (Signed)
Attending cardiology review of echo order:  Due to the spread of COVID-19, departmental policy is to review orders for procedures and determine appropriateness regarding testing at this time. The following criteria are being used to limit potential exposure and spread of infection.  Is the patient being evaluated for COVID-19 infection: YES, test is in process  Is the patient actively having infectious respiratory symptoms or fever: yes, hypoxia/SOB Does the patient have a known history of prior cardiovascular disease: yes, prior EF 45-50% Can the test be performed at a later time: yes  Based on the review above, this study is not to be performed at this time. Patient is actively being ruled out for COVID. Test in process. If test returns negative, echo can then be performed. If patient decompensates and there is concern for myocardial involvement, please contact cardiology DOD and limited test can be done.  Buford Dresser, MD, PhD [facility]  [address] [place], [place] [address] [phone]

## 2018-05-09 NOTE — Progress Notes (Addendum)
PROGRESS NOTE  Deejay Koppelman TKZ:[id number] DOB: 1955-03-15 DOA: 05/09/2018 PCP: Patient, No Pcp Per  Brief History:  63 year old male with a history of diastolic CHF, CKD stage II, basal cell carcinoma status post partial rhinectomy and maxillectomy on 5/7/2019at [facility], advanced colonic tubular adenoma presented with acute onset of shortness of breath that began in the early morning of 05/09/2018 that woke him out of bed.  The patient denied any fevers, chills, chest pain, nausea, vomiting, diarrhea, abdominal pain, dysuria.  He has a chronic nonproductive cough.  He continues to smoke 1/2 pack/day.  He has not had any recent travels outside of the state of [place] nor has he had any close contact with COVID-19 patients.  Upon arrival, EMS noted the patient to have oxygen saturation of 80% on room air.  He was placed on CPAP initially.  Upon arrival to the emergency department, the patient was placed on BiPAP.  However in the setting of recurrent COVID-19 pandemic, the patient was placed on a nonrebreather.  Chest x-ray showed increased interstitial markings with right lower lobe greater than left lower lobe opacity.  WBC was 18.7.  BMP was unremarkable with his serum creatinine at baseline 1.30.  BNP was 659.  Troponin was elevated at 0.25 which increased to 1.57.  Patient was given furosemide 40 mg IV at 0503 and 20 mg IV at 0603.  He was started on nitroglycerin drip as his initial blood pressure was noted to be 183/93. During his time in the emergency department, the patient urinated approximately 2 L with furosemide.  His oxygen demand has decreased to 2 L nasal cannula.  In the setting of recurrent coronavirus pandemic, the patient is being ruled out for COVID-19.  He was given vancomycin and cefepime initially in the ED.  He was subsequently started on ceftriaxone and azithromycin.  Pt noted to have Hgb 7.4 in ED and transfused one unit PRBC.  Assessment/Plan: Acute respiratory  failure with hypoxia -Secondary to pulmonary edema and pneumonia -Initially on BiPAP>>> weaned to 2 L after IV furosemide -Wean oxygen for saturation greater 92%  Acute on chronic systolic and diastolic CHF -57/03/2200 echo--EF 45-50%, grade 2 DD, HK basal mid inferior lateral -Continue IV furosemide -Daily weights -Accurate I's and O's  Lobar pneumonia -Personally reviewed chest x-ray--RLL>LLL opacity, increased interstitial markings -Continue azithromycin and ceftriaxone -Influenza PCR negative -Coronavirus NAA ordered -LDH 122  Elevated troponin -Patient was given Lovenox 1 mg/kg x 1 by Dr. Maudie Mercury (dose given at 0600) -troponin 0.25>>>1.57 -continue to trend troponin -transition to IV heparin as appropriate -cardiology consult -10/19/17 Lexiscan high risk  Malignant hypertension -The patient endorsed compliance with metoprolol succinate -Increase metoprolol succinate 25 mg daily -Anticipate improvement with diuresis  RightCarotid stenosis -The patient has follow-up with Dr. Juanda Crum[place] -05/12/2017 carotid ultrasound shows 80-99% right carotid stenosis -70% heavily calcified RICA stenosis and no significant left-sided disease by CT angiography.  He is now following with Dr. Benson Norway was not felt to be a candidate for carotid stenting and suboptimal candidate for endarterectomy. Plan is for medical therapy at this point -continue ASA  CKD stage II -Baseline creatinine 1.0-1.3 -Monitor BMP  Hypokalemia -Replete -Magnesium 2.0  Basal cell carcinoma of nasolabial fold -06/06/2017--right partial rhinectomy, resection of left intranasal lesion, skin graft@[facility] -Margins were clear on pathology -Most recent follow-up was 06/23/2017  Iron deficiency anemia/Advanced colonic adenoma/Heme positive stool -The patient had colonoscopy 05/31/2017--Dr. Rourk -He was noted to have  numerous polyps as well as a polypoid mass in the hepatic flexure to the cecum area -pathology of  mass--tubular adenoma -He was referred to general surgery, Dr. Blake Divine for hemicolectomy -He has been lost to follow-up -noncompliant with po iron  Tobacco abuse I have discussed tobacco cessation with the patient. I have counseled the patient regarding the negative impacts of continued tobacco use including but not limited to lung cancer, COPD, and cardiovascular disease. I have discussed alternatives to tobacco and modalities that may help facilitate tobacco cessation including but not limited to biofeedback, hypnosis, and medications. Total time spent with tobacco counseling was 4 minutes.   Disposition Plan:  Transfer to [facility] Communication:   No Family at bedside  Consultants:  cardiology  Code Status:  FULL   DVT Prophylaxis:  IV Heparin    Procedures: As Listed in Progress Note Above  Antibiotics: None  Total time spent 45 minutes.  Greater than 50% spent face to face counseling and coordinating care. 0650 to 0735   PPE: bouffant cap, N95 mask, face shield, gown, gloves     Subjective: Patient is breathing better now after IV furosemide.  He denies any fevers, chills, headache, sore throat, chest pain, nausea, vomiting, diarrhea, domino pain, dysuria, hematuria.  There is no hemoptysis.  Objective: Vitals:   05/09/18 0644 05/09/18 0645 05/09/18 0657 05/09/18 0700  BP: 126/81 140/83 (!) 158/87 (!) 158/87  Pulse: 88 88 92 90  Resp: (!) 25 (!) 22 19 17   Temp: 98 F (36.7 C)  97.7 F (36.5 C)   TempSrc: Oral  Oral   SpO2: 98% 98% 100% 100%  Weight:      Height:        Intake/Output Summary (Last 24 hours) at 05/09/2018 9211 Last data filed at 05/09/2018 0701 Gross per 24 hour  Intake 1221.74 ml  Output 1550 ml  Net -328.26 ml   Weight change:  Exam:   General:  Pt is alert, follows commands appropriately, not in acute distress  HEENT: No icterus, No thrush, No neck mass, [place]/AT  Cardiovascular: RRR, S1/S2, no rubs, no gallops   Respiratory: Bibasilar crackles, recurrent left.  No wheezing.  Good air movement.  Abdomen: Soft/+BS, non tender, non distended, no guarding  Extremities: No edema, No lymphangitis, No petechiae, No rashes, no synovitis   Data Reviewed: I have personally reviewed following labs and imaging studies Basic Metabolic Panel: Recent Labs  Lab 05/09/18 0135  NA 138  K 3.8  CL 106  CO2 22  GLUCOSE 149*  BUN 23  CREATININE 1.30*  CALCIUM 9.0  MG 2.2   Liver Function Tests: Recent Labs  Lab 05/09/18 0135  AST 27  ALT 19  ALKPHOS 121  BILITOT 0.2*  PROT 7.4  ALBUMIN 3.6   No results for input(s): LIPASE, AMYLASE in the last 168 hours. No results for input(s): AMMONIA in the last 168 hours. Coagulation Profile: Recent Labs  Lab 05/09/18 0234  INR 1.1   CBC: Recent Labs  Lab 05/09/18 0135  WBC 18.7*  NEUTROABS 15.6*  HGB 7.4*  HCT 28.0*  MCV 67.0*  PLT 488*   Cardiac Enzymes: Recent Labs  Lab 05/09/18 0135 05/09/18 0455  TROPONINI 0.25* 1.57*   BNP: Invalid input(s): POCBNP CBG: No results for input(s): GLUCAP in the last 168 hours. HbA1C: No results for input(s): HGBA1C in the last 72 hours. Urine analysis:    Component Value Date/Time   COLORURINE YELLOW 11/03/2017 2138   APPEARANCEUR  HAZY (A) 11/03/2017 2138   APPEARANCEUR Cloudy 04/23/2011 1044   LABSPEC 1.006 11/03/2017 2138   LABSPEC 1.018 04/23/2011 1044   PHURINE 7.0 11/03/2017 2138   GLUCOSEU NEGATIVE 11/03/2017 2138   GLUCOSEU Negative 04/23/2011 1044   HGBUR LARGE (A) 11/03/2017 2138   BILIRUBINUR NEGATIVE 11/03/2017 2138   BILIRUBINUR Negative 04/23/2011 [place] 11/03/2017 2138   PROTEINUR 100 (A) 11/03/2017 2138   NITRITE NEGATIVE 11/03/2017 2138   LEUKOCYTESUR LARGE (A) 11/03/2017 2138   LEUKOCYTESUR Trace 04/23/2011 1044   Sepsis Labs: @LABRCNTIP (procalcitonin:4,lacticidven:4) ) Recent Results (from the past 240 hour(s))  Blood culture (routine x 2)      Status: None (Preliminary result)   Collection Time: 05/09/18  2:32 AM  Result Value Ref Range Status   Specimen Description BLOOD LEFT ANTECUBITAL  Final   Special Requests   Final    BOTTLES DRAWN AEROBIC AND ANAEROBIC Blood Culture adequate volume   Culture   Final    NO GROWTH < 12 HOURS Performed at [facility], [address] [place], [place] [address]    Report Status PENDING  Incomplete  Blood culture (routine x 2)     Status: None (Preliminary result)   Collection Time: 05/09/18  2:34 AM  Result Value Ref Range Status   Specimen Description BLOOD RIGHT ANTECUBITAL  Final   Special Requests   Final    BOTTLES DRAWN AEROBIC AND ANAEROBIC Blood Culture adequate volume   Culture   Final    NO GROWTH < 12 HOURS Performed at [facility], [address], [place], [place] [address]    Report Status PENDING  Incomplete     Scheduled Meds: . sodium chloride   Intravenous Once  . acetaminophen  650 mg Oral Once  . aspirin EC  81 mg Oral Daily  . cyanocobalamin  1,000 mcg Intramuscular Once  . diphenhydrAMINE  25 mg Oral Once  . metoprolol succinate  12.5 mg Oral Daily  . sodium chloride flush  3 mL Intravenous Q12H  . vitamin B-12  1,000 mcg Oral Daily   Continuous Infusions: . sodium chloride    . azithromycin Stopped (05/09/18 0701)  . cefTRIAXone (ROCEPHIN)  IV    . nitroGLYCERIN 55 mcg/min (05/09/18 0700)    Procedures/Studies: Dg Chest Portable 1 View  Result Date: 05/09/2018 CLINICAL DATA:  Initial evaluation for acute shortness of breath. History of COPD, hypertension, smoking. EXAM: PORTABLE CHEST 1 VIEW COMPARISON:  Prior radiograph from 10/01/2017 FINDINGS: Mild cardiomegaly. Mediastinal silhouette normal. Lungs mildly hypoinflated. Bibasilar consolidative opacities, right greater than left, concerning for multifocal pneumonia. Underlying mild diffuse pulmonary interstitial congestion. No pleural effusion. No pneumothorax. No acute osseous finding. IMPRESSION: 1.  Bibasilar consolidative opacities, right greater than left, concerning for multifocal pneumonia. 2. Underlying mild diffuse pulmonary interstitial edema. Electronically Signed   By: Jeannine Boga M.D.   On: 05/09/2018 02:08    Orson Eva, DO  [facility] Pager [phone]  If 7PM-7AM, please contact night-coverage [email or web address] Password [email or web address] 05/09/2018, 7:12 AM   LOS: 0 days

## 2018-05-10 DIAGNOSIS — J810 Acute pulmonary edema: Secondary | ICD-10-CM

## 2018-05-10 DIAGNOSIS — J9601 Acute respiratory failure with hypoxia: Secondary | ICD-10-CM

## 2018-05-10 LAB — COMPREHENSIVE METABOLIC PANEL
ALT: 19 U/L (ref 0–44)
AST: 29 U/L (ref 15–41)
Albumin: 3 g/dL — ABNORMAL LOW (ref 3.5–5.0)
Alkaline Phosphatase: 89 U/L (ref 38–126)
Anion gap: 11 (ref 5–15)
BUN: 29 mg/dL — ABNORMAL HIGH (ref 8–23)
CO2: 23 mmol/L (ref 22–32)
Calcium: 8.9 mg/dL (ref 8.9–10.3)
Chloride: 107 mmol/L (ref 98–111)
Creatinine, Ser: 1.39 mg/dL — ABNORMAL HIGH (ref 0.61–1.24)
GFR calc Af Amer: >60 mL/min (ref >60)
GFR calc non Af Amer: 54 mL/min — ABNORMAL LOW (ref >60)
Glucose, Bld: 100 mg/dL — ABNORMAL HIGH (ref 70–99)
Potassium: 3.5 mmol/L (ref 3.5–5.1)
Sodium: 141 mmol/L (ref 135–145)
Total Bilirubin: 0.4 mg/dL (ref 0.3–1.2)
Total Protein: 6.4 g/dL — ABNORMAL LOW (ref 6.5–8.1)

## 2018-05-10 LAB — HEMOGLOBIN AND HEMATOCRIT, BLOOD
HCT: 32.8 % — ABNORMAL LOW (ref 39.0–52.0)
Hemoglobin: 9.2 g/dL — ABNORMAL LOW (ref 13.0–17.0)

## 2018-05-10 LAB — TYPE AND SCREEN
ABO/RH(D): O POS
Antibody Screen: NEGATIVE
Unit division: 0

## 2018-05-10 LAB — CBC WITH DIFFERENTIAL/PLATELET
Abs Immature Granulocytes: 0.07 10*3/uL (ref 0.00–0.07)
Basophils Absolute: 0.1 10*3/uL (ref 0.0–0.1)
Basophils Relative: 1 %
Eosinophils Absolute: 0.1 10*3/uL (ref 0.0–0.5)
Eosinophils Relative: 1 %
HCT: 26.2 % — ABNORMAL LOW (ref 39.0–52.0)
Hemoglobin: 7.5 g/dL — ABNORMAL LOW (ref 13.0–17.0)
Immature Granulocytes: 1 %
Lymphocytes Relative: 21 %
Lymphs Abs: 3.3 10*3/uL (ref 0.7–4.0)
MCH: 18.9 pg — ABNORMAL LOW (ref 26.0–34.0)
MCHC: 28.6 g/dL — ABNORMAL LOW (ref 30.0–36.0)
MCV: 66.2 fL — ABNORMAL LOW (ref 80.0–100.0)
Monocytes Absolute: 1.4 10*3/uL — ABNORMAL HIGH (ref 0.1–1.0)
Monocytes Relative: 9 %
Neutro Abs: 10.5 10*3/uL — ABNORMAL HIGH (ref 1.7–7.7)
Neutrophils Relative %: 67 %
Platelets: 413 10*3/uL — ABNORMAL HIGH (ref 150–400)
RBC: 3.96 MIL/uL — ABNORMAL LOW (ref 4.22–5.81)
RDW: 19.9 % — ABNORMAL HIGH (ref 11.5–15.5)
WBC: 15.4 10*3/uL — ABNORMAL HIGH (ref 4.0–10.5)
nRBC: 0 % (ref 0.0–0.2)

## 2018-05-10 LAB — FOLATE RBC
Folate, Hemolysate: 340 ng/mL
Folate, RBC: 1308 ng/mL (ref >498)
Hematocrit: 26 % — ABNORMAL LOW (ref 37.5–51.0)

## 2018-05-10 LAB — BPAM RBC
Blood Product Expiration Date: 202004262359
ISSUE DATE / TIME: 202004080637
Unit Type and Rh: 5100

## 2018-05-10 LAB — TROPONIN I: Troponin I: 0.62 ng/mL (ref <0.03)

## 2018-05-10 LAB — GLUCOSE, CAPILLARY: Glucose-Capillary: 81 mg/dL (ref 70–99)

## 2018-05-10 LAB — PREPARE RBC (CROSSMATCH)

## 2018-05-10 LAB — INTERLEUKIN-6, PLASMA: Interleukin-6, Plasma: 78.1 pg/mL — ABNORMAL HIGH (ref 0.0–12.2)

## 2018-05-10 MED ORDER — FUROSEMIDE 10 MG/ML IJ SOLN
20.0000 mg | Freq: Once | INTRAMUSCULAR | Status: AC
  Administered 2018-05-10: 17:00:00 20 mg via INTRAVENOUS
  Filled 2018-05-10: qty 2

## 2018-05-10 MED ORDER — HYDRALAZINE HCL 25 MG PO TABS
25.0000 mg | ORAL_TABLET | Freq: Three times a day (TID) | ORAL | Status: DC
  Administered 2018-05-10 – 2018-05-12 (×6): 25 mg via ORAL
  Filled 2018-05-10 (×6): qty 1

## 2018-05-10 MED ORDER — FUROSEMIDE 40 MG PO TABS
40.0000 mg | ORAL_TABLET | Freq: Every day | ORAL | Status: DC
  Administered 2018-05-10 – 2018-05-13 (×3): 40 mg via ORAL
  Filled 2018-05-10 (×3): qty 1

## 2018-05-10 MED ORDER — SODIUM CHLORIDE 0.9% IV SOLUTION
Freq: Once | INTRAVENOUS | Status: AC
  Administered 2018-05-10: 14:00:00 via INTRAVENOUS

## 2018-05-10 MED ORDER — SODIUM CHLORIDE 0.9 % IV SOLN
510.0000 mg | Freq: Once | INTRAVENOUS | Status: AC
  Administered 2018-05-10: 510 mg via INTRAVENOUS
  Filled 2018-05-10: qty 17

## 2018-05-10 MED ORDER — METOPROLOL TARTRATE 25 MG PO TABS
25.0000 mg | ORAL_TABLET | Freq: Two times a day (BID) | ORAL | Status: DC
  Administered 2018-05-10 – 2018-05-11 (×4): 25 mg via ORAL
  Filled 2018-05-10 (×4): qty 1

## 2018-05-10 MED ORDER — HYDRALAZINE HCL 20 MG/ML IJ SOLN
10.0000 mg | Freq: Once | INTRAMUSCULAR | Status: AC
  Administered 2018-05-10: 16:00:00 10 mg via INTRAVENOUS
  Filled 2018-05-10: qty 1

## 2018-05-10 NOTE — Progress Notes (Signed)
Cardiology asked Dr. Tana Coast be notified of elevated BP. Dr. Tana Coast paged,

## 2018-05-10 NOTE — Progress Notes (Signed)
Patient c/o chest pain. Pointing to center of chest rating 5/10, stating feels like a pressure, denies SOB, BP 154/71, HR, 82, sating 97% on RA. Skin warm and dry. EKG being done.   NTG gtt increased to 25 mcg/min with no change in pain after 5 min, NTG increased to 52mcg/min with pain decreased to a 3/10, increased to 35 mcg/min, pain now a 1/10. Dr. Tana Coast notified.

## 2018-05-10 NOTE — Progress Notes (Signed)
Patient now states pain 0/10. Spoke with cardiology NP, Eugene Garnet, reviewed EKG, updated on increased NTG gtt and BP.  Will continue to monitor.

## 2018-05-10 NOTE — Progress Notes (Signed)
BP elevated 165/93 and 181/85 last 2 readings.  Continues on nitroglycerine gtt at 33mcg/min.  PA Sarajane Jews notified.

## 2018-05-10 NOTE — Consult Note (Signed)
Cardiology Consult    Patient ID: Michael [name] MRN: [id number], DOB/AGE: May 22, 1955   Admit date: 05/09/2018 Date of Consult: 05/10/2018  Primary Physician: Patient, No Pcp Per Primary Cardiologist: Rozann Lesches, MD Requesting Provider: Orson Eva, MD  Patient Profile    Michael [name] is a 63 y.o. male with a history of chronic combined CHF with EF of 45 to 50% on Echo in 12/2017, right carotid artery stenosis, syncope, hypertension, COPD, CKD stage III, and right facial basal cell carcinoma s/p partial mastectomy and partial rhinectomy with skin graft in 05/2017 who is being seen today for the evaluation of CHF and elevated troponin at the request of Dr. Carles Collet.  History of Present Illness    Michael [name] is a 63 year old male with the above history who is followed by Dr. Domenic Polite. Patient has had multiple ED/hospitalizations for syncope thought to be orthostatic. Lexiscan Myoview in 10/2017 was consistent with prior inferior/septal myocardial infarction with mild peri-infarct ischemia and decreased LVEF of 33%. This was considered a high-risk study primarily due to reduced LVEF.  No cath  Echocardiogram from 12/2017 showed LVEF of 45 to 50% with grade 2 diastolic dysfunction and hypokinesis of the basal-midinferolateral myocardium.  Pt has a hx of CV dz as well  Seen by Dr. Oneida Alar with [facility] in 01/2018 Carotid imaging studies were reviewed including CT angiography which demonstrated a 70% heavily calcified RICA stenosis and no significant left-sided disease. He was not felt to be a candidate for carotid stenting and suboptimal candidate for endarterectomy. Plan is for medical therapy  Patient was last seen by Dr. Domenic Polite on 04/12/2018 Doing OK at the time   He was started on Toprol XL 12.5mg  daily at that time due to his mildly reduced EF.  Patient presented to the [facility] ED via EMS for evaluation of shortness of breath. (Given the current COVID-19 pandemic and in an effort to minimize  exposure, I called into the patient's room to obtain the history).  Patient reports was feeling well in his usual state of health until the evening of 05/08/2018 when he developed acute onset of shortness of breath at rest and orthopnea that prevented him from sleeping. Patient denies any other cardiac symptoms including chest pain, palpitations, lightheadedness/dizziness, syncope, or edema. He has had some body aches which he attributes to his age but denies any fever or chills. He has had some nasal congestion which he states is do to his right partial rhinectomy and a dry cough for the past couple of months. He denies any sick contacts or known exposure to COVID-19. Due to the severity of his shortness of breath, he called 911. When EMS arrived, patient reportedly in severe respiratory distress with O2 sats in the 80's and he was cool, diaphoretic, and clammy. EMS initially placed patient on CPAP en route to the ED.  In the ED, patient hypertensive but vitals stable. . Initial troponin elevated at 0.25. Chest x-ray showed bibasilar consolidative opacities (R>L) concerning for multifocal pneumonia and underlying mild diffuse pulmonary interstitial edema. BNP elevated at 659.0. D-dimer elevated at 2.81. WBC 18.7, Hgb 7.4, Plts 488. Na 138, K 3.8, Mg 2.2, Glucose 149, SCr 1.30. Patient was initially started on BiPAP in the ED but then this was stopped due to some concern for COVID-19. Patient was admitted for further evaluation. Patient was started on antibiotics and IV Lasix. Decision ultimately made to transfer him to [facility].  At the time of this evaluation, patient sounds like  he is breathing much better. He does not sound labored over the the phone. He denies CP    Says he may be eating some extra salt  No swelling in feet.        Patient has a 20 pack year smoking history and currently smokes 1/2 pack per day. He denies alcohol use or recreational drug use. No known family history of heart  disease.  Past Medical History   Past Medical History:  Diagnosis Date   Carotid artery disease (HCC)    Chronic diastolic heart failure (HCC)    CKD (chronic kidney disease) stage 3, GFR 30-59 ml/min (HCC)    COPD (chronic obstructive pulmonary disease) (HCC)    Essential hypertension    Head and neck cancer ([place]) 2019   Right facial basal cell carcinoma s/p resection with right partial mastectomy and partal rhinectomy with skin graft (06/13/17)   Iron deficiency anemia    Urinary retention     Past Surgical History:  Procedure Laterality Date   ANKLE CLOSED REDUCTION Right    COLONOSCOPY N/A 05/31/2017   Procedure: COLONOSCOPY;  Surgeon: Daneil Dolin, MD;  Location: [facility];  Service: Endoscopy;  Laterality: N/A;  2:45pm   POLYPECTOMY  05/31/2017   Procedure: POLYPECTOMY;  Surgeon: Daneil Dolin, MD;  Location: [facility];  Service: Endoscopy;;   TONSILLECTOMY       Allergies  No Known Allergies  Inpatient Medications     sodium chloride   Intravenous Once   acetaminophen  650 mg Oral Once   aspirin EC  81 mg Oral Daily   cyanocobalamin  1,000 mcg Intramuscular Once   diphenhydrAMINE  25 mg Oral Once   enoxaparin (LOVENOX) injection  80 mg Subcutaneous Q12H   metoprolol succinate  25 mg Oral Daily   sodium chloride flush  3 mL Intravenous Q12H   vitamin B-12  1,000 mcg Oral Daily    Family History    Family History  Problem Relation Age of Onset   Stroke Father    Cirrhosis Mother    Colon cancer Neg Hx    He indicated that his mother is deceased. He indicated that his father is deceased. He indicated that his brother is alive. He indicated that his son is alive. He indicated that the status of his neg hx is unknown.   Social History    Social History   Socioeconomic History   Marital status: Married    Spouse name: Not on file   Number of children: Not on file   Years of education: Not on file   Highest education  level: Not on file  Occupational History   Not on file  Social Needs   Financial resource strain: Not on file   Food insecurity:    Worry: Not on file    Inability: Not on file   Transportation needs:    Medical: Not on file    Non-medical: Not on file  Tobacco Use   Smoking status: Current Every Day Smoker    Packs/day: 0.50    Years: 40.00    Pack years: 20.00    Types: Cigarettes   Smokeless tobacco: Never Used  Substance and Sexual Activity   Alcohol use: Not Currently    Frequency: Never   Drug use: Never   Sexual activity: Not on file  Lifestyle   Physical activity:    Days per week: Not on file    Minutes per session: Not on file  Stress: Not on file  Relationships   Social connections:    Talks on phone: Not on file    Gets together: Not on file    Attends religious service: Not on file    Active member of club or organization: Not on file    Attends meetings of clubs or organizations: Not on file    Relationship status: Not on file   Intimate partner violence:    Fear of current or ex partner: Not on file    Emotionally abused: Not on file    Physically abused: Not on file    Forced sexual activity: Not on file  Other Topics Concern   Not on file  Social History Narrative   Not on file     Review of Systems    Review of Systems  Constitutional: Positive for diaphoresis. Negative for chills and fever.  HENT: Positive for congestion.   Respiratory: Positive for cough and shortness of breath. Negative for hemoptysis and sputum production.   Cardiovascular: Positive for orthopnea and PND. Negative for chest pain, palpitations and leg swelling.  Gastrointestinal: Negative for blood in stool, nausea and vomiting.  Genitourinary: Negative for hematuria.  Musculoskeletal: Positive for myalgias. Negative for falls.  Neurological: Negative for dizziness and loss of consciousness.  Endo/Heme/Allergies: Does not bruise/bleed easily.   Psychiatric/Behavioral: Positive for substance abuse (tobacco use).    Physical Exam   Physical Exam per MD:  Blood pressure (!) 166/86, pulse 87, temperature (!) 97.5 F (36.4 C), temperature source Oral, resp. rate (!) 22, height 6' (1.829 m), weight 82.4 kg, SpO2 98 %.  Exam deferred given COVID r/o, in order to minimize exposure    Labs    Troponin [facility] Test) No results for input(s): TROPIPOC in the last 72 hours. Recent Labs    05/09/18 0135 05/09/18 0234 05/09/18 0455 05/09/18 0850  CKTOTAL  --  129  --  140  CKMB  --  12.2*  --   --   TROPONINI 0.25*  --  1.57*  --    Lab Results  Component Value Date   WBC 15.4 (H) 05/10/2018   HGB 7.5 (L) 05/10/2018   HCT 26.2 (L) 05/10/2018   MCV 66.2 (L) 05/10/2018   PLT 413 (H) 05/10/2018    Recent Labs  Lab 05/10/18 0441  NA 141  K 3.5  CL 107  CO2 23  BUN 29*  CREATININE 1.39*  CALCIUM 8.9  PROT 6.4*  BILITOT 0.4  ALKPHOS 89  ALT 19  AST 29  GLUCOSE 100*   Lab Results  Component Value Date   CHOL 194 04/02/2017   HDL 42 04/02/2017   LDLCALC 142 (H) 04/02/2017   TRIG 49 04/02/2017   Lab Results  Component Value Date   DDIMER 2.81 (H) 05/09/2018     Radiology Studies    Dg Chest Portable 1 View  Result Date: 05/09/2018 CLINICAL DATA:  Initial evaluation for acute shortness of breath. History of COPD, hypertension, smoking. EXAM: PORTABLE CHEST 1 VIEW COMPARISON:  Prior radiograph from 10/01/2017 FINDINGS: Mild cardiomegaly. Mediastinal silhouette normal. Lungs mildly hypoinflated. Bibasilar consolidative opacities, right greater than left, concerning for multifocal pneumonia. Underlying mild diffuse pulmonary interstitial congestion. No pleural effusion. No pneumothorax. No acute osseous finding. IMPRESSION: 1. Bibasilar consolidative opacities, right greater than left, concerning for multifocal pneumonia. 2. Underlying mild diffuse pulmonary interstitial edema. Electronically Signed   By:  Jeannine Boga M.D.   On: 05/09/2018 02:08  EKG     EKG: EKG was personally reviewed and demonstrates:   SR 91 bpm  FIrst degree AV block   LVH with repolarization abnormality (05/09/18) Telemetry: Telemetry was personally reviewed and demonstrates:  SR   Cardiac Imaging    Echocardiogram 12/05/2017: Study Conclusions: - Left ventricle: The cavity size was normal. Wall thickness was   increased in a pattern of mild LVH. Systolic function was mildly   reduced. The estimated ejection fraction was in the range of 45%   to 50%. There is hypokinesis of the basal-midinferolateral   myocardium. Features are consistent with a pseudonormal left   ventricular filling pattern, with concomitant abnormal relaxation   and increased filling pressure (grade 2 diastolic dysfunction). - Aortic valve: Mildly calcified annulus. Trileaflet; mildly   calcified leaflets. - Mitral valve: Mildly calcified annulus. There was mild   regurgitation. - Left atrium: The atrium was mildly dilated. - Tricuspid valve: There was trivial regurgitation. - Pulmonary arteries: Systolic pressure could not be accurately   estimated. - Pericardium, extracardiac: A trivial pericardial effusion was   identified anterior to the heart.  Assessment & Plan     1  Dyspnea   Pt presents with severe hypoxia.   CXR with bilateral infiltrates sugg of CHF    BNP elevated. D Dimer elevated    He has received IV lasix with good urine output  Breathing is improved but not back to baseline   ? Acute diastolic CHF  ? PE   ? Hypertensive urgency  Exacerbated by anemia      ? Infection contributing   Being r/o for COVID infection.  Concerning with d dimer elevation    Continue IV  Lasix   Follow exam      Unfortunately with anemia, anticoag is contraindicatoed    Consider CT once cleared       2   Elevated troponin   0.25, 1.57    Pt probably hs CAD    Has signif carotid dz.   Myovue was not normal in 2019 as noted  above With hyoxia and severe anemia it would not be surprising to have troponin elevation due to demand ischemia.     Diurese.   Follow  Agree with transfusion   Again, diurese   Manage medically    Severe anemia is a strong severe contraindication for ischemic evaluation  3  Acute on Chronic Combined CHF  Last echo LVEF 45 to 50%   As noted above   Continue diuresis    Hypertension - Patient has been very hypertensive at times with systolic BP as high as the 180's.  This contributed to presentation    - Most recent BP 166/89. - Patient was initially on Nitro drip but this appears to have been weaned. - Continue Toprol as above. Would add very low dose amlodipine for tighter control (ARB would require lab f/u now)    Right Carotid Stenosis - Patient was seen by Dr. Oneida Alar with [facility] in 01/2018. Carotid imaging studies were reviewed including CT angiography which demonstrated a 70% heavily calcified RICA stenosis and no significant left-sided disease. He was not felt to be a candidate for carotid stenting and suboptimal candidate for endarterectomy. Plan is for medical therapy at this point.  - Continue Aspirin and Lipitor.  Anemia - Hemoglobin 7.4 on admission.  Consistent with Fe deficiency    Dropped from 7 months ago    - Hemoccult positive. - He received 1 unit of PRBCs yesterday. -  Patient had a colonoscopy in 05/2017 which showed numerous polyps as well as a polypoid mass in the hepatic flexure to the cecum area. Pathology of mass was tubular adenoma. He was referred to General Surgery for hemicolectomy but was lost to follow-up.   Should have GI evaluation    HL  COntinue lipitor     Signed, Darreld Mclean, PA-C 05/10/2018, 9:28 AM Pager: [phone]   Pt seen and examined   I have spoken to patient on phone (not examined due to concerns for COVID infection, minimize exposure)   I have amended note above to reflect my findings WIll continue to follow  Dorris Carnes MD

## 2018-05-10 NOTE — Progress Notes (Addendum)
[facility]                                                                              Michael [name] Demographics  Michael [name], is a 63 y.o. male, DOB - Oct 10, 1955, ZOX:[id number]  Admit date - 05/09/2018   Admitting Physician Orson Eva, MD  Outpatient Primary MD for the Michael [name] is Michael [name], No Pcp Per  Outpatient specialists:   LOS - 1  days   Medical records reviewed and are as summarized below:    Chief Complaint  Michael [name] presents with  . Respiratory Distress       Brief summary   Michael [name] is a 62 year old male with a history of diastolic CHF, CKD stage II, basal cell carcinoma status post partial rhinectomy and maxillectomy on 06/06/2017 presented with acute dyspnea that began on the morning of 05/09/2018 when he was admitted.  Denied any fever chills chest pain, nausea vomiting diarrhea, myalgias, abdominal pain or dysuria.  Reported chronic nonproductive cough, continues to smoke half pack per day.  No recent travels or any close contacts with COVID-19 patients. Upon arrival, Michael [name] was noted to have O2 sats of 80% on room air, was placed on CPAP initially and then on BiPAP.  In the setting of possible COVID, was placed on NRB.  Chest x-ray showed increased interstitial markings, bilateral opacities.  WBC is 18.7, creatinine 1.3, BNP 659 Troponin initially elevated at 0.25 then increased to 1.57.  Michael [name] received 2 doses of furosemide, started on nitroglycerin drip, O2 demands have improved Michael [name] was also noted to have hemoglobin of 7.4 in ED and was transfused 1 unit packed RBCs. Michael [name] was transferred from [facility] to [facility] for further work-up. COVID-19 results pending   Assessment & Plan    Principal Problem:   Acute hypoxemic respiratory failure (HCC)  secondary to lobar pneumonia, acute on chronic systolic and diastolic CHF -Initially on BiPAP, now weaned off of O2, O2 sats 98% on room air, symptoms improved after furosemide. -No fevers, influenza  negative, respiratory virus panel negative -Continue Zithromax, ceftriaxone.  LDH 122 - covid-19 results pending  Active Problems: Acute on chronic systolic and diastolic CHF -Symptoms improved after furosemide, 2D echo 12/2017 had shown EF of 45 to 50% with grade 2 diastolic dysfunction -Continue furosemide, daily weights, I's and O's -Negative balance of 1.3 L -Creatinine bumped up to 1.3, changed to oral Lasix 40 mg daily -Cardiology consulted  Elevated troponins -Troponin 0.25, then trended up to 1.57 possibly due to demand ischemia secondary to acute hypoxia, CHF -Cardiology consulted, Michael [name] had high risk Lexiscan in 10/2017 -Initially started on IV heparin then changed to Lovenox.  Michael [name] is currently refusing Lovenox.  Malignant hypertension -BP still elevated, continue metoprolol, currently on nitro drip  Basal cell carcinoma of nasolabial fold - Michael [name] underwent right partial rhinectomy, resection of left intranasal lesion, skin graft at [facility] in 05/2017, margins were clear on pathology -Outpatient follow-up  History of acute on chronic iron deficiency anemia, advanced colonic adenoma, heme positive stools Colonoscopy 05/2017 had shown numerous polyps and polypoid mass in the hepatic flexure to the cecum area, pathology tubular adenoma Michael [name] was referred  to general surgery, Dr. Blake Divine for hemicolectomy however has been lost to follow-up.  Noncompliant with p.o. iron -Hemoglobin 7.4 on 4/8, was transfused 1 unit packed RBCs, trended up to 8.2, now 7.5 FOBT positive -Discussed with gastroenterology, with current respiratory failure, lobar pneumonia, Covid rule out, unable to do endoscopies inpatient unless actively hemorrhaging -Recommended to give unit of blood and Feraheme, outpatient follow-up with Michael [name]'s gastroenterologist, Dr. Gala Romney  History of right carotid stenosis -Follows Dr. Oneida Alar outpatient, continue medical therapy at this point.  Per Dr. Oneida Alar; not  a candidate for carotid stenting and suboptimal candidate for endarterectomy.  Code Status: full code  DVT Prophylaxis:  Lovenox, but Michael [name] refusing  Family Communication: Discussed in detail with the Michael [name], all imaging results, lab results explained to the Michael [name]   Disposition Plan:   Time Spent in minutes  62mins   Procedures:  None   Consultants:   Cardiology GI   Antimicrobials:   Anti-infectives (From admission, onward)   Start     Dose/Rate Route Frequency Ordered Stop   05/09/18 1000  cefTRIAXone (ROCEPHIN) 2 g in sodium chloride 0.9 % 100 mL IVPB     2 g 200 mL/hr over 30 Minutes Intravenous Every 24 hours 05/09/18 0429     05/09/18 0430  azithromycin (ZITHROMAX) 500 mg in sodium chloride 0.9 % 250 mL IVPB     500 mg 250 mL/hr over 60 Minutes Intravenous Every 24 hours 05/09/18 0429     05/09/18 0145  vancomycin (VANCOCIN) 1,750 mg in sodium chloride 0.9 % 500 mL IVPB     1,750 mg 250 mL/hr over 120 Minutes Intravenous  Once 05/09/18 0140 05/09/18 0630   05/09/18 0145  ceFEPIme (MAXIPIME) 2 g in sodium chloride 0.9 % 100 mL IVPB     2 g 200 mL/hr over 30 Minutes Intravenous  Once 05/09/18 0140 05/09/18 0404          Medications  Scheduled Meds: . sodium chloride   Intravenous Once  . acetaminophen  650 mg Oral Once  . aspirin EC  81 mg Oral Daily  . cyanocobalamin  1,000 mcg Intramuscular Once  . diphenhydrAMINE  25 mg Oral Once  . enoxaparin (LOVENOX) injection  80 mg Subcutaneous Q12H  . metoprolol succinate  25 mg Oral Daily  . sodium chloride flush  3 mL Intravenous Q12H  . vitamin B-12  1,000 mcg Oral Daily   Continuous Infusions: . sodium chloride    . azithromycin 500 mg (05/10/18 0459)  . cefTRIAXone (ROCEPHIN)  IV 2 g (05/10/18 0853)  . nitroGLYCERIN 20 mcg/min (05/09/18 2342)   PRN Meds:.sodium chloride, acetaminophen, nitroGLYCERIN, sodium chloride flush      Subjective:   [name] was seen and examined today.  No acute  complaints, no obvious bleeding.  He denies any chest pain.  Shortness of breath is improving.  O2 weaned off. Michael [name] denies dizziness, abdominal pain, N/V/D/C, new weakness, numbess, tingling.  BP still elevated  Objective:   Vitals:   05/10/18 0502 05/10/18 0725 05/10/18 0753 05/10/18 0851  BP: (!) 167/71 (!) 170/101 (!) 150/72 (!) 166/86  Pulse: 77  78 87  Resp:      Temp: 97.7 F (36.5 C)  (!) 97.5 F (36.4 C)   TempSrc: Oral  Oral   SpO2: 97%  98%   Weight: 82.4 kg     Height:        Intake/Output Summary (Last 24 hours) at 05/10/2018 1109 Last data filed at 05/10/2018  0853 Gross per 24 hour  Intake 1054.88 ml  Output 1800 ml  Net -745.12 ml     Wt Readings from Last 3 Encounters:  05/10/18 82.4 kg  04/12/18 81.2 kg  03/01/18 80.7 kg     Exam  General: Alert and oriented x 3, NAD  Eyes: PERRLA, EOMI, Anicteric Sclera,  HEENT: Rhinectomy scar  Cardiovascular: S1 S2 auscultated,  Regular rate and rhythm.  Respiratory: Decreased breath sound at the bases, no wheezing  Gastrointestinal: Soft, nontender, nondistended, + bowel sounds  Ext: no pedal edema bilaterally  Neuro: No new deficits  Musculoskeletal: No digital cyanosis, clubbing  Skin: No rashes  Psych: Normal affect and demeanor, alert and oriented x3    Data Reviewed:  I have personally reviewed following labs and imaging studies  Micro Results Recent Results (from the past 240 hour(s))  Blood culture (routine x 2)     Status: None (Preliminary result)   Collection Time: 05/09/18  2:32 AM  Result Value Ref Range Status   Specimen Description BLOOD LEFT ANTECUBITAL  Final   Special Requests   Final    BOTTLES DRAWN AEROBIC AND ANAEROBIC Blood Culture adequate volume   Culture   Final    NO GROWTH 1 DAY Performed at [facility], [address] [place], [place] [address]    Report Status PENDING  Incomplete  Blood culture (routine x 2)     Status: None (Preliminary result)   Collection  Time: 05/09/18  2:34 AM  Result Value Ref Range Status   Specimen Description BLOOD RIGHT ANTECUBITAL  Final   Special Requests   Final    BOTTLES DRAWN AEROBIC AND ANAEROBIC Blood Culture adequate volume   Culture   Final    NO GROWTH 1 DAY Performed at [facility], [address] [place], [place] [address]    Report Status PENDING  Incomplete  Respiratory Panel by PCR     Status: None   Collection Time: 05/09/18  4:21 AM  Result Value Ref Range Status   Adenovirus NOT DETECTED NOT DETECTED Final   Coronavirus 229E NOT DETECTED NOT DETECTED Final    Comment: (NOTE) The Coronavirus on the Respiratory Panel, DOES NOT test for the novel  Coronavirus (2019 nCoV)    Coronavirus HKU1 NOT DETECTED NOT DETECTED Final   Coronavirus NL63 NOT DETECTED NOT DETECTED Final   Coronavirus OC43 NOT DETECTED NOT DETECTED Final   Metapneumovirus NOT DETECTED NOT DETECTED Final   Rhinovirus / Enterovirus NOT DETECTED NOT DETECTED Final   Influenza A NOT DETECTED NOT DETECTED Final   Influenza B NOT DETECTED NOT DETECTED Final   Parainfluenza Virus 1 NOT DETECTED NOT DETECTED Final   Parainfluenza Virus 2 NOT DETECTED NOT DETECTED Final   Parainfluenza Virus 3 NOT DETECTED NOT DETECTED Final   Parainfluenza Virus 4 NOT DETECTED NOT DETECTED Final   Respiratory Syncytial Virus NOT DETECTED NOT DETECTED Final   Bordetella pertussis NOT DETECTED NOT DETECTED Final   Chlamydophila pneumoniae NOT DETECTED NOT DETECTED Final   Mycoplasma pneumoniae NOT DETECTED NOT DETECTED Final    Comment: Performed at [facility], [address] [place], [place] [address]    Radiology Reports Dg Chest Portable 1 View  Result Date: 05/09/2018 CLINICAL DATA:  Initial evaluation for acute shortness of breath. History of COPD, hypertension, smoking. EXAM: PORTABLE CHEST 1 VIEW COMPARISON:  Prior radiograph from 10/01/2017 FINDINGS: Mild cardiomegaly. Mediastinal silhouette normal. Lungs mildly hypoinflated.  Bibasilar consolidative opacities, right greater than  left, concerning for multifocal pneumonia. Underlying mild diffuse pulmonary interstitial congestion. No pleural effusion. No pneumothorax. No acute osseous finding. IMPRESSION: 1. Bibasilar consolidative opacities, right greater than left, concerning for multifocal pneumonia. 2. Underlying mild diffuse pulmonary interstitial edema. Electronically Signed   By: Jeannine Boga M.D.   On: 05/09/2018 02:08    Lab Data:  CBC: Recent Labs  Lab 05/09/18 0135 05/09/18 0850 05/10/18 0441  WBC 18.7* 11.0* 15.4*  NEUTROABS 15.6*  --  10.5*  HGB 7.4* 8.2* 7.5*  HCT 28.0* 30.2* 26.2*  MCV 67.0* 66.8* 66.2*  PLT 488* 498* 720*   Basic Metabolic Panel: Recent Labs  Lab 05/09/18 0135 05/10/18 0441  NA 138 141  K 3.8 3.5  CL 106 107  CO2 22 23  GLUCOSE 149* 100*  BUN 23 29*  CREATININE 1.30* 1.39*  CALCIUM 9.0 8.9  MG 2.2  --    GFR: Estimated Creatinine Clearance: 59.7 mL/min (A) (by C-G formula based on SCr of 1.39 mg/dL (H)). Liver Function Tests: Recent Labs  Lab 05/09/18 0135 05/10/18 0441  AST 27 29  ALT 19 19  ALKPHOS 121 89  BILITOT 0.2* 0.4  PROT 7.4 6.4*  ALBUMIN 3.6 3.0*   No results for input(s): LIPASE, AMYLASE in the last 168 hours. No results for input(s): AMMONIA in the last 168 hours. Coagulation Profile: Recent Labs  Lab 05/09/18 0234  INR 1.1   Cardiac Enzymes: Recent Labs  Lab 05/09/18 0135 05/09/18 0234 05/09/18 0455 05/09/18 0850  CKTOTAL  --  129  --  140  CKMB  --  12.2*  --   --   TROPONINI 0.25*  --  1.57*  --    BNP (last 3 results) No results for input(s): PROBNP in the last 8760 hours. HbA1C: No results for input(s): HGBA1C in the last 72 hours. CBG: Recent Labs  Lab 05/10/18 0748  GLUCAP 81   Lipid Profile: No results for input(s): CHOL, HDL, LDLCALC, TRIG, CHOLHDL, LDLDIRECT in the last 72 hours. Thyroid Function Tests: No results for input(s): TSH, T4TOTAL, FREET4,  T3FREE, THYROIDAB in the last 72 hours. Anemia Panel: Recent Labs    05/09/18 0234  VITAMINB12 218  FOLATE 10.1  FERRITIN 8*  TIBC 513*  IRON 11*  RETICCTPCT 1.3   Urine analysis:    Component Value Date/Time   COLORURINE STRAW (A) 05/09/2018 0717   APPEARANCEUR CLEAR 05/09/2018 0717   APPEARANCEUR Cloudy 04/23/2011 1044   LABSPEC 1.005 05/09/2018 0717   LABSPEC 1.018 04/23/2011 1044   PHURINE 6.0 05/09/2018 0717   GLUCOSEU NEGATIVE 05/09/2018 0717   GLUCOSEU Negative 04/23/2011 1044   HGBUR NEGATIVE 05/09/2018 0717   BILIRUBINUR NEGATIVE 05/09/2018 0717   BILIRUBINUR Negative 04/23/2011 [place] 05/09/2018 0717   PROTEINUR NEGATIVE 05/09/2018 0717   NITRITE POSITIVE (A) 05/09/2018 0717   LEUKOCYTESUR SMALL (A) 05/09/2018 0717   LEUKOCYTESUR Trace 04/23/2011 1044     Aesha Agrawal M.D. [facility] 05/10/2018, 11:09 AM  Pager: [phone] Between 7am to 7pm - call Pager - [phone]  After 7pm go to [email or web address] - password [email or web address]  Call night coverage person covering after 7pm

## 2018-05-10 NOTE — Progress Notes (Signed)
    Called by RN with patient experiencing chest pain while on nitro gtt. Blood pressures significantly elevated earlier but now improving. Nitro titrated up and now pain free at the time of follow up. Admitted with dyspnea, PNA and undergoing COVID r/o. EKG this evening with TWI in lateral leads which is new from admission EKG, but TWI depression in lateral leads noted on arrival to the ED. Given he is now pain free will continue with supportive care during COVID rule out. Also anemic requiring transfusion. Continue to trend troponin this evening. Per noted was on IV heparin and switched to Lovenox, though patient is now refusing injections.   Barnet Pall, NP-C 05/10/2018, 6:51 PM Pager: [phone]

## 2018-05-11 LAB — TYPE AND SCREEN
ABO/RH(D): O POS
Antibody Screen: NEGATIVE
Unit division: 0

## 2018-05-11 LAB — CBC
HCT: 32.5 % — ABNORMAL LOW (ref 39.0–52.0)
Hemoglobin: 9 g/dL — ABNORMAL LOW (ref 13.0–17.0)
MCH: 18.7 pg — ABNORMAL LOW (ref 26.0–34.0)
MCHC: 27.7 g/dL — ABNORMAL LOW (ref 30.0–36.0)
MCV: 67.6 fL — ABNORMAL LOW (ref 80.0–100.0)
Platelets: 464 10*3/uL — ABNORMAL HIGH (ref 150–400)
RBC: 4.81 MIL/uL (ref 4.22–5.81)
RDW: 21.6 % — ABNORMAL HIGH (ref 11.5–15.5)
WBC: 11.9 10*3/uL — ABNORMAL HIGH (ref 4.0–10.5)
nRBC: 0 % (ref 0.0–0.2)

## 2018-05-11 LAB — BASIC METABOLIC PANEL
Anion gap: 11 (ref 5–15)
BUN: 24 mg/dL — ABNORMAL HIGH (ref 8–23)
CO2: 24 mmol/L (ref 22–32)
Calcium: 9 mg/dL (ref 8.9–10.3)
Chloride: 106 mmol/L (ref 98–111)
Creatinine, Ser: 1.35 mg/dL — ABNORMAL HIGH (ref 0.61–1.24)
GFR calc Af Amer: >60 mL/min (ref >60)
GFR calc non Af Amer: 55 mL/min — ABNORMAL LOW (ref >60)
Glucose, Bld: 100 mg/dL — ABNORMAL HIGH (ref 70–99)
Potassium: 3.4 mmol/L — ABNORMAL LOW (ref 3.5–5.1)
Sodium: 141 mmol/L (ref 135–145)

## 2018-05-11 LAB — BPAM RBC
Blood Product Expiration Date: 202004162359
ISSUE DATE / TIME: 202004091409
Unit Type and Rh: 5100

## 2018-05-11 LAB — NOVEL CORONAVIRUS, NAA (HOSP ORDER, SEND-OUT TO REF LAB; TAT 18-24 HRS): SARS-CoV-2, NAA: NOT DETECTED

## 2018-05-11 LAB — LEGIONELLA PNEUMOPHILA SEROGP 1 UR AG: L. pneumophila Serogp 1 Ur Ag: NEGATIVE

## 2018-05-11 MED ORDER — POTASSIUM CHLORIDE CRYS ER 20 MEQ PO TBCR
40.0000 meq | EXTENDED_RELEASE_TABLET | Freq: Once | ORAL | Status: AC
  Administered 2018-05-11: 40 meq via ORAL
  Filled 2018-05-11: qty 2

## 2018-05-11 MED ORDER — ISOSORBIDE MONONITRATE ER 30 MG PO TB24
15.0000 mg | ORAL_TABLET | Freq: Every day | ORAL | Status: DC
  Administered 2018-05-11 – 2018-05-13 (×3): 15 mg via ORAL
  Filled 2018-05-11 (×3): qty 1

## 2018-05-11 MED ORDER — FUROSEMIDE 10 MG/ML IJ SOLN
40.0000 mg | Freq: Once | INTRAMUSCULAR | Status: AC
  Administered 2018-05-11: 40 mg via INTRAVENOUS
  Filled 2018-05-11: qty 4

## 2018-05-11 NOTE — Progress Notes (Signed)
ANTICOAGULATION CONSULT NOTE - Follow-Up Consult  Pharmacy Consult for  Heparin >> enoxaparin Indication:  ACS/STEMI  No Known Allergies  Patient Measurements: Height: 6' (182.9 cm) Weight: 177 lb 6.4 oz (80.5 kg) IBW/kg (Calculated) : 77.6 Heparin Dosing Weight:  HEPARIN DW (KG): 82.6  Vital Signs: Temp: 97.8 F (36.6 C) (04/10 0928) Temp Source: Oral (04/10 0928) BP: 164/87 (04/10 0928)  Labs: Recent Labs    05/09/18 0135 05/09/18 0234 05/09/18 0455 05/09/18 0850 05/10/18 0441 05/10/18 1850 05/10/18 1906 05/11/18 0500  HGB 7.4*  --   --  8.2* 7.5* 9.2*  --  9.0*  HCT 28.0*  --  26.0* 30.2* 26.2* 32.8*  --  32.5*  PLT 488*  --   --  498* 413*  --   --  464*  APTT  --  32  --   --   --   --   --   --   LABPROT  --  14.3  --   --   --   --   --   --   INR  --  1.1  --   --   --   --   --   --   HEPARINUNFRC  --   --   --  1.46*  --   --   --   --   CREATININE 1.30*  --   --   --  1.39*  --   --  1.35*  CKTOTAL  --  129  --  140  --   --   --   --   CKMB  --  12.2*  --   --   --   --   --   --   TROPONINI 0.25*  --  1.57*  --   --   --  0.62*  --     Estimated Creatinine Clearance: 61.5 mL/min (A) (by C-G formula based on SCr of 1.35 mg/dL (H)).   Medical History: Past Medical History:  Diagnosis Date  . Carotid artery disease ([place])   . Chronic diastolic heart failure ([place])   . CKD (chronic kidney disease) stage 3, GFR 30-59 ml/min (HCC)   . COPD (chronic obstructive pulmonary disease) ([place])   . Essential hypertension   . Head and neck cancer ([place]) 2019   Right facial basal cell carcinoma s/p resection with right partial mastectomy and partal rhinectomy with skin graft (06/13/17)  . Iron deficiency anemia   . Urinary retention      Assessment: 47 yoM admitted with CP and positive troponins. Pharmacy initially consulted to start IV heparin for ACS, however given pt has COVID-19 testing in process he was transitioned to enoxaparin to limit lab draws. He has  been refusing lovenox (last dose was 4/8). Noted plans for possible medical management. She was noted with episodes of Chest pain last night.   Goal of Therapy:  Anti-Xa level 0.6-1 units/ml 4hrs after LMWH dose given Monitor platelets by anticoagulation protocol: Yes   Plan:  -Patient refusing lovenox -Will follow further plans  Hildred Laser, PharmD Clinical Pharmacist **Pharmacist phone directory can now be found on [email or web address] (PW [email or web address]).  Listed under [place].

## 2018-05-11 NOTE — Progress Notes (Signed)
[facility]                                                                              Patient Demographics  Michael [name], is a 63 y.o. male, DOB - Aug 15, 1955, UTM:[id number]  Admit date - 05/09/2018   Admitting Physician Orson Eva, MD  Outpatient Primary MD for the patient is Patient, No Pcp Per  Outpatient specialists:   LOS - 2  days   Medical records reviewed and are as summarized below:    Chief Complaint  Patient presents with  . Respiratory Distress       Brief summary   Patient is a 63 year old male with a history of diastolic CHF, CKD stage II, basal cell carcinoma status post partial rhinectomy and maxillectomy on 06/06/2017 presented with acute dyspnea that began on the morning of 05/09/2018 when he was admitted.  Denied any fever chills chest pain, nausea vomiting diarrhea, myalgias, abdominal pain or dysuria.  Reported chronic nonproductive cough, continues to smoke half pack per day.  No recent travels or any close contacts with COVID-19 patients. Upon arrival, patient was noted to have O2 sats of 80% on room air, was placed on CPAP initially and then on BiPAP.  In the setting of possible COVID, was placed on NRB.  Chest x-ray showed increased interstitial markings, bilateral opacities.  WBC is 18.7, creatinine 1.3, BNP 659 Troponin initially elevated at 0.25 then increased to 1.57.  Patient received 2 doses of furosemide, started on nitroglycerin drip, O2 demands have improved Patient was also noted to have hemoglobin of 7.4 in ED and was transfused 1 unit packed RBCs. Patient was transferred from [facility] to [facility] for further work-up. COVID-19 results pending   Assessment & Plan    Principal Problem:   Acute hypoxemic respiratory failure ([place])  secondary to lobar pneumonia, acute on chronic systolic and diastolic CHF -Initially on BiPAP, now weaned off of O2, sats 99% on room air.  Symptoms improved after Lasix.   -No fevers, influenza negative,  respiratory virus panel negative -Continue Zithromax, ceftriaxone.  LDH 122 -COVID-19 results still pending, respiratory virus panel negative, influenza negative  Active Problems: Acute on chronic systolic and diastolic CHF -Symptoms improved after furosemide, 2D echo 12/2017 had shown EF of 45 to 50% with grade 2 diastolic dysfunction -Continue furosemide, daily weights, I's and O's -Negative balance of 2.28 L -Creatinine stable at 1.3, cardiology following, echo canceled by cardiology due to covid results pending and no change in management   Elevated troponins -Troponin 0.25, then trended up to 1.57 possibly due to demand ischemia secondary to acute hypoxia, CHF, now trending down -Cardiology consulted, patient had high risk Lexiscan in 10/2017 -Patient had multiple episodes of chest pain yesterday, on nitro drip, cardiology following, recommended medical management    acute on chronic iron deficiency anemia, advanced colonic adenoma, heme positive stools Colonoscopy 05/2017 had shown numerous polyps and polypoid mass in the hepatic flexure to the cecum area, pathology tubular adenoma Patient was referred to general surgery, Dr. Blake Divine for hemicolectomy however has been lost to follow-up.  Noncompliant with p.o. iron -Hemoglobin 7.4 on 4/8, was  transfused 1 unit packed RBCs, transfused again on 4/9.   -I discussed with GI, Nancy Marus on 4/9, with current respiratory failure, lobar pneumonia, Covid rule out, unable to do endoscopies inpatient unless actively hemorrhaging.  Recommended transfusion and outpatient follow-up with his gastroenterologist, Dr. Gala Romney -Patient received 1 unit of blood transfusion and 1 unit of IV Feraheme on 4/9, hemoglobin stable at 9.0 - If COVID 19 negative, will request GI consult again  Malignant hypertension -BP better today, continue metoprolol, hydralazine, Imdur, wean off nitroglycerin drip.    Basal cell carcinoma of nasolabial fold -  Patient underwent right partial rhinectomy, resection of left intranasal lesion, skin graft at [facility] in 05/2017, margins were clear on pathology -Outpatient follow-up  History of right carotid stenosis -Follows Dr. Oneida Alar outpatient, continue medical therapy at this point.  Per Dr. Oneida Alar; not a candidate for carotid stenting and suboptimal candidate for endarterectomy.  Code Status: full code  DVT Prophylaxis:  Lovenox, but patient refusing  Family Communication: called patient's friend listed on the face sheet as contact, unable to make contact with the friend and no voicemail established.    Disposition Plan:   Time Spent in minutes  65mins   Procedures:  None   Consultants:   Cardiology GI   Antimicrobials:   Anti-infectives (From admission, onward)   Start     Dose/Rate Route Frequency Ordered Stop   05/09/18 1000  cefTRIAXone (ROCEPHIN) 2 g in sodium chloride 0.9 % 100 mL IVPB     2 g 200 mL/hr over 30 Minutes Intravenous Every 24 hours 05/09/18 0429     05/09/18 0430  azithromycin (ZITHROMAX) 500 mg in sodium chloride 0.9 % 250 mL IVPB     500 mg 250 mL/hr over 60 Minutes Intravenous Every 24 hours 05/09/18 0429     05/09/18 0145  vancomycin (VANCOCIN) 1,750 mg in sodium chloride 0.9 % 500 mL IVPB     1,750 mg 250 mL/hr over 120 Minutes Intravenous  Once 05/09/18 0140 05/09/18 0630   05/09/18 0145  ceFEPIme (MAXIPIME) 2 g in sodium chloride 0.9 % 100 mL IVPB     2 g 200 mL/hr over 30 Minutes Intravenous  Once 05/09/18 0140 05/09/18 0404         Medications  Scheduled Meds: . sodium chloride   Intravenous Once  . acetaminophen  650 mg Oral Once  . cyanocobalamin  1,000 mcg Intramuscular Once  . diphenhydrAMINE  25 mg Oral Once  . enoxaparin (LOVENOX) injection  80 mg Subcutaneous Q12H  . furosemide  40 mg Intravenous Once  . furosemide  40 mg Oral Daily  . hydrALAZINE  25 mg Oral Q8H  . isosorbide mononitrate  15 mg Oral Daily  . metoprolol tartrate  25 mg  Oral BID  . potassium chloride  40 mEq Oral Once  . sodium chloride flush  3 mL Intravenous Q12H  . vitamin B-12  1,000 mcg Oral Daily   Continuous Infusions: . sodium chloride    . azithromycin 500 mg (05/11/18 0420)  . cefTRIAXone (ROCEPHIN)  IV 2 g (05/11/18 0923)  . nitroGLYCERIN 25 mcg/min (05/11/18 0440)   PRN Meds:.sodium chloride, acetaminophen, nitroGLYCERIN, sodium chloride flush      Subjective:   Michael [name] was seen and examined today.  No complaints per patient today, no chest pain or shortness of breath.  No abdominal pain.  Still on nitro drip.  No fevers or chills. Patient denies dizziness, abdominal pain, N/V/D/C, new weakness, numbess, tingling.  BP improving  Objective:   Vitals:   05/11/18 0425 05/11/18 0445 05/11/18 0928 05/11/18 1133  BP: (!) 149/73  (!) 164/87 (!) 141/75  Pulse:    72  Resp: 15   15  Temp: 98.1 F (36.7 C)  97.8 F (36.6 C) 98 F (36.7 C)  TempSrc:   Oral Oral  SpO2: 95%  98% 99%  Weight:  80.5 kg    Height:        Intake/Output Summary (Last 24 hours) at 05/11/2018 1251 Last data filed at 05/11/2018 1134 Gross per 24 hour  Intake 1738.38 ml  Output 3200 ml  Net -1461.62 ml     Wt Readings from Last 3 Encounters:  05/11/18 80.5 kg  04/12/18 81.2 kg  03/01/18 80.7 kg   Physical Exam  General: Alert and oriented x 3, NAD, comfortable  Eyes:   HEENT: Rhinectomy scar  Cardiovascular: S1 S2 clear, , RRR. No pedal edema b/l  Respiratory: Fairly CTAB  Gastrointestinal: Soft, nontender, nondistended, NBS  Ext: no pedal edema bilaterally  Neuro: no new deficits  Musculoskeletal: No cyanosis, clubbing  Skin: No rashes  Psych: Normal affect and demeanor, alert and oriented x3    Data Reviewed:  I have personally reviewed following labs and imaging studies  Micro Results Recent Results (from the past 240 hour(s))  Blood culture (routine x 2)     Status: None (Preliminary result)   Collection Time: 05/09/18   2:32 AM  Result Value Ref Range Status   Specimen Description BLOOD LEFT ANTECUBITAL  Final   Special Requests   Final    BOTTLES DRAWN AEROBIC AND ANAEROBIC Blood Culture adequate volume   Culture   Final    NO GROWTH 2 DAYS Performed at [facility], [address], [place], [place] [address]    Report Status PENDING  Incomplete  Blood culture (routine x 2)     Status: None (Preliminary result)   Collection Time: 05/09/18  2:34 AM  Result Value Ref Range Status   Specimen Description BLOOD RIGHT ANTECUBITAL  Final   Special Requests   Final    BOTTLES DRAWN AEROBIC AND ANAEROBIC Blood Culture adequate volume   Culture   Final    NO GROWTH 2 DAYS Performed at [facility], [address] [place], [place] [address]    Report Status PENDING  Incomplete  Respiratory Panel by PCR     Status: None   Collection Time: 05/09/18  4:21 AM  Result Value Ref Range Status   Adenovirus NOT DETECTED NOT DETECTED Final   Coronavirus 229E NOT DETECTED NOT DETECTED Final    Comment: (NOTE) The Coronavirus on the Respiratory Panel, DOES NOT test for the novel  Coronavirus (2019 nCoV)    Coronavirus HKU1 NOT DETECTED NOT DETECTED Final   Coronavirus NL63 NOT DETECTED NOT DETECTED Final   Coronavirus OC43 NOT DETECTED NOT DETECTED Final   Metapneumovirus NOT DETECTED NOT DETECTED Final   Rhinovirus / Enterovirus NOT DETECTED NOT DETECTED Final   Influenza A NOT DETECTED NOT DETECTED Final   Influenza B NOT DETECTED NOT DETECTED Final   Parainfluenza Virus 1 NOT DETECTED NOT DETECTED Final   Parainfluenza Virus 2 NOT DETECTED NOT DETECTED Final   Parainfluenza Virus 3 NOT DETECTED NOT DETECTED Final   Parainfluenza Virus 4 NOT DETECTED NOT DETECTED Final   Respiratory Syncytial Virus NOT DETECTED NOT DETECTED Final   Bordetella pertussis NOT DETECTED NOT DETECTED Final   Chlamydophila pneumoniae NOT DETECTED NOT DETECTED  Final   Mycoplasma pneumoniae NOT DETECTED NOT DETECTED Final     Comment: Performed at [facility], [place] [address], [place], [place] [address]    Radiology Reports Dg Chest Portable 1 View  Result Date: 05/09/2018 CLINICAL DATA:  Initial evaluation for acute shortness of breath. History of COPD, hypertension, smoking. EXAM: PORTABLE CHEST 1 VIEW COMPARISON:  Prior radiograph from 10/01/2017 FINDINGS: Mild cardiomegaly. Mediastinal silhouette normal. Lungs mildly hypoinflated. Bibasilar consolidative opacities, right greater than left, concerning for multifocal pneumonia. Underlying mild diffuse pulmonary interstitial congestion. No pleural effusion. No pneumothorax. No acute osseous finding. IMPRESSION: 1. Bibasilar consolidative opacities, right greater than left, concerning for multifocal pneumonia. 2. Underlying mild diffuse pulmonary interstitial edema. Electronically Signed   By: Jeannine Boga M.D.   On: 05/09/2018 02:08    Lab Data:  CBC: Recent Labs  Lab 05/09/18 0135 05/09/18 0455 05/09/18 0850 05/10/18 0441 05/10/18 1850 05/11/18 0500  WBC 18.7*  --  11.0* 15.4*  --  11.9*  NEUTROABS 15.6*  --   --  10.5*  --   --   HGB 7.4*  --  8.2* 7.5* 9.2* 9.0*  HCT 28.0* 26.0* 30.2* 26.2* 32.8* 32.5*  MCV 67.0*  --  66.8* 66.2*  --  67.6*  PLT 488*  --  498* 413*  --  315*   Basic Metabolic Panel: Recent Labs  Lab 05/09/18 0135 05/10/18 0441 05/11/18 0500  NA 138 141 141  K 3.8 3.5 3.4*  CL 106 107 106  CO2 22 23 24   GLUCOSE 149* 100* 100*  BUN 23 29* 24*  CREATININE 1.30* 1.39* 1.35*  CALCIUM 9.0 8.9 9.0  MG 2.2  --   --    GFR: Estimated Creatinine Clearance: 61.5 mL/min (A) (by C-G formula based on SCr of 1.35 mg/dL (H)). Liver Function Tests: Recent Labs  Lab 05/09/18 0135 05/10/18 0441  AST 27 29  ALT 19 19  ALKPHOS 121 89  BILITOT 0.2* 0.4  PROT 7.4 6.4*  ALBUMIN 3.6 3.0*   No results for input(s): LIPASE, AMYLASE in the last 168 hours. No results for input(s): AMMONIA in the last 168 hours. Coagulation  Profile: Recent Labs  Lab 05/09/18 0234  INR 1.1   Cardiac Enzymes: Recent Labs  Lab 05/09/18 0135 05/09/18 0234 05/09/18 0455 05/09/18 0850 05/10/18 1906  CKTOTAL  --  129  --  140  --   CKMB  --  12.2*  --   --   --   TROPONINI 0.25*  --  1.57*  --  0.62*   BNP (last 3 results) No results for input(s): PROBNP in the last 8760 hours. HbA1C: No results for input(s): HGBA1C in the last 72 hours. CBG: Recent Labs  Lab 05/10/18 0748  GLUCAP 81   Lipid Profile: No results for input(s): CHOL, HDL, LDLCALC, TRIG, CHOLHDL, LDLDIRECT in the last 72 hours. Thyroid Function Tests: No results for input(s): TSH, T4TOTAL, FREET4, T3FREE, THYROIDAB in the last 72 hours. Anemia Panel: Recent Labs    05/09/18 0234  VITAMINB12 218  FOLATE 10.1  FERRITIN 8*  TIBC 513*  IRON 11*  RETICCTPCT 1.3   Urine analysis:    Component Value Date/Time   COLORURINE STRAW (A) 05/09/2018 0717   APPEARANCEUR CLEAR 05/09/2018 0717   APPEARANCEUR Cloudy 04/23/2011 1044   LABSPEC 1.005 05/09/2018 0717   LABSPEC 1.018 04/23/2011 1044   PHURINE 6.0 05/09/2018 [place] 05/09/2018 0717   GLUCOSEU Negative 04/23/2011 1044  [place] NEGATIVE 05/09/2018 [place] 05/09/2018 0717   BILIRUBINUR Negative 04/23/2011 1044   [place] 05/09/2018 0717   PROTEINUR NEGATIVE 05/09/2018 0717   NITRITE POSITIVE (A) 05/09/2018 0717   LEUKOCYTESUR SMALL (A) 05/09/2018 0717   LEUKOCYTESUR Trace 04/23/2011 1044     Michael [name] M.D. [facility] 05/11/2018, 12:51 PM  Pager: [phone] Between 7am to 7pm - call Pager - [phone]  After 7pm go to [email or web address] - password [email or web address]  Call night coverage person covering after 7pm

## 2018-05-11 NOTE — Progress Notes (Signed)
Progress Note  Patient Name: Michael [name] Date of Encounter: 05/11/2018  Primary Cardiologist: Rozann Lesches, MD   Subjective   Spoke to patient over phone  Still in covid rule out   Pt says his breathing is much better from yesterday   Chest pressure is gone     Inpatient Medications    Scheduled Meds: . sodium chloride   Intravenous Once  . acetaminophen  650 mg Oral Once  . aspirin EC  81 mg Oral Daily  . cyanocobalamin  1,000 mcg Intramuscular Once  . diphenhydrAMINE  25 mg Oral Once  . enoxaparin (LOVENOX) injection  80 mg Subcutaneous Q12H  . furosemide  40 mg Oral Daily  . hydrALAZINE  25 mg Oral Q8H  . metoprolol tartrate  25 mg Oral BID  . potassium chloride  40 mEq Oral Once  . sodium chloride flush  3 mL Intravenous Q12H  . vitamin B-12  1,000 mcg Oral Daily   Continuous Infusions: . sodium chloride    . azithromycin 500 mg (05/11/18 0420)  . cefTRIAXone (ROCEPHIN)  IV 2 g (05/10/18 0853)  . nitroGLYCERIN 25 mcg/min (05/11/18 0440)   PRN Meds: sodium chloride, acetaminophen, nitroGLYCERIN, sodium chloride flush   Vital Signs    Vitals:   05/11/18 0151 05/11/18 0230 05/11/18 0425 05/11/18 0445  BP: (!) 145/73 (!) 147/76 (!) 149/73   Pulse:      Resp: 16  15   Temp: 98.5 F (36.9 C)  98.1 F (36.7 C)   TempSrc: Oral     SpO2: 94%  95%   Weight:    80.5 kg  Height:        Intake/Output Summary (Last 24 hours) at 05/11/2018 0745 Last data filed at 05/11/2018 0500 Gross per 24 hour  Intake 2116.26 ml  Output 3050 ml  Net -933.74 ml    Net neg 2.4 L   Last 3 Weights 05/11/2018 05/10/2018 05/09/2018  Weight (lbs) 177 lb 6.4 oz 181 lb 9.6 oz 182 lb 1.6 oz  Weight (kg) 80.468 kg 82.373 kg 82.6 kg      Telemetry    SR - Personally Reviewed  ECG     Physical Exam   Exam not done as interviewed over phone   Pt denies edema     Labs    Chemistry Recent Labs  Lab 05/09/18 0135 05/10/18 0441 05/11/18 0500  NA 138 141 141  K 3.8 3.5  3.4*  CL 106 107 106  CO2 22 23 24   GLUCOSE 149* 100* 100*  BUN 23 29* 24*  CREATININE 1.30* 1.39* 1.35*  CALCIUM 9.0 8.9 9.0  PROT 7.4 6.4*  --   ALBUMIN 3.6 3.0*  --   AST 27 29  --   ALT 19 19  --   ALKPHOS 121 89  --   BILITOT 0.2* 0.4  --   GFRNONAA 58* 54* 55*  GFRAA >60 >60 >60  ANIONGAP 10 11 11      Hematology Recent Labs  Lab 05/09/18 0850 05/10/18 0441 05/10/18 1850 05/11/18 0500  WBC 11.0* 15.4*  --  11.9*  RBC 4.52 3.96*  --  4.81  HGB 8.2* 7.5* 9.2* 9.0*  HCT 30.2* 26.2* 32.8* 32.5*  MCV 66.8* 66.2*  --  67.6*  MCH 18.1* 18.9*  --  18.7*  MCHC 27.2* 28.6*  --  27.7*  RDW 19.5* 19.9*  --  21.6*  PLT 498* 413*  --  464*    Cardiac Enzymes Recent  Labs  Lab 05/09/18 0135 05/09/18 0455 05/10/18 1906  TROPONINI 0.25* 1.57* 0.62*   No results for input(s): TROPIPOC in the last 168 hours.   BNP Recent Labs  Lab 05/09/18 0135  BNP 659.0*     DDimer  Recent Labs  Lab 05/09/18 0234  DDIMER 2.81*     Radiology    No results found.  Cardiac Studies     Patient Profile     63 y.o. male history of chronic combined CHF with EF of 45 to 50% on Echo in 12/2017, right carotid artery stenosis, syncope, hypertension, COPD, CKD stage III, and right facial basal cell carcinoma s/p partial mastectomy and partial rhinectomy with skin graft in 05/2017 who is being seen today for the evaluation of CHF and elevated troponin at the request of Dr. Carles Collet.  Assessment & Plan    1  Dyspnea  Improving with diuresis and transfusion  I am writing for one more dose of lasix Strict I/O   BNP in AM     I would not order echo   It will not change how treating patient in short term   Medical Rx    With Fe anemia no plans at present for more aggressive evaluation    2  Elevated troponin    Pt denies CP    May represent ischemia in setting of CHF and anemia  Prob has CAD but without symptoms of angina now   Continue medical Rx  With severe Fe defic anemia would need GI to  eval for ASA use   Hold for now  3  Acute on chronic systolic / disastolic CHF  Continue with medical Rx   Agree with Hydralazine   Add Imdur   With renal dysfunction at this point in time I would not add ARB or ACE    Follow cinically When recovers completely would consider echo   Not now.     4  HTn  Add imdur  NOw on hydralazine  COntinue toprol        5  CV dz  Continue statin      6  Anemia  Tx   Hgb in 9s   Fe deficient    Further eval per  IM   Hx polyps   Would at least give Fe.     For questions or updates, please contact Leon Please consult [email or web address] for contact info under        Signed, Dorris Carnes, MD  05/11/2018, 7:45 AM

## 2018-05-12 DIAGNOSIS — I5043 Acute on chronic combined systolic (congestive) and diastolic (congestive) heart failure: Secondary | ICD-10-CM

## 2018-05-12 LAB — BASIC METABOLIC PANEL
Anion gap: 14 (ref 5–15)
BUN: 23 mg/dL (ref 8–23)
CO2: 24 mmol/L (ref 22–32)
Calcium: 9.2 mg/dL (ref 8.9–10.3)
Chloride: 104 mmol/L (ref 98–111)
Creatinine, Ser: 1.26 mg/dL — ABNORMAL HIGH (ref 0.61–1.24)
GFR calc Af Amer: >60 mL/min (ref >60)
GFR calc non Af Amer: >60 mL/min (ref >60)
Glucose, Bld: 88 mg/dL (ref 70–99)
Potassium: 3.3 mmol/L — ABNORMAL LOW (ref 3.5–5.1)
Sodium: 142 mmol/L (ref 135–145)

## 2018-05-12 LAB — CBC
HCT: 33.1 % — ABNORMAL LOW (ref 39.0–52.0)
Hemoglobin: 9.3 g/dL — ABNORMAL LOW (ref 13.0–17.0)
MCH: 18.9 pg — ABNORMAL LOW (ref 26.0–34.0)
MCHC: 28.1 g/dL — ABNORMAL LOW (ref 30.0–36.0)
MCV: 67.3 fL — ABNORMAL LOW (ref 80.0–100.0)
Platelets: 433 10*3/uL — ABNORMAL HIGH (ref 150–400)
RBC: 4.92 MIL/uL (ref 4.22–5.81)
RDW: 22 % — ABNORMAL HIGH (ref 11.5–15.5)
WBC: 10.9 10*3/uL — ABNORMAL HIGH (ref 4.0–10.5)
nRBC: 0 % (ref 0.0–0.2)

## 2018-05-12 LAB — MAGNESIUM: Magnesium: 2.1 mg/dL (ref 1.7–2.4)

## 2018-05-12 LAB — BRAIN NATRIURETIC PEPTIDE: B Natriuretic Peptide: 594.6 pg/mL — ABNORMAL HIGH (ref 0.0–100.0)

## 2018-05-12 MED ORDER — METOPROLOL SUCCINATE ER 50 MG PO TB24
50.0000 mg | ORAL_TABLET | Freq: Every day | ORAL | Status: DC
  Administered 2018-05-12 – 2018-05-13 (×2): 50 mg via ORAL
  Filled 2018-05-12 (×2): qty 1

## 2018-05-12 MED ORDER — POTASSIUM CHLORIDE CRYS ER 20 MEQ PO TBCR
40.0000 meq | EXTENDED_RELEASE_TABLET | Freq: Once | ORAL | Status: AC
  Administered 2018-05-12: 16:00:00 40 meq via ORAL
  Filled 2018-05-12: qty 2

## 2018-05-12 MED ORDER — ATORVASTATIN CALCIUM 40 MG PO TABS
40.0000 mg | ORAL_TABLET | Freq: Every day | ORAL | Status: DC
  Administered 2018-05-12: 40 mg via ORAL
  Filled 2018-05-12: qty 1

## 2018-05-12 MED ORDER — LISINOPRIL 5 MG PO TABS
5.0000 mg | ORAL_TABLET | Freq: Every day | ORAL | Status: DC
  Administered 2018-05-12 – 2018-05-13 (×2): 5 mg via ORAL
  Filled 2018-05-12 (×2): qty 1

## 2018-05-12 NOTE — Progress Notes (Signed)
[facility]                                                                              Patient Demographics  Michael [name], is a 63 y.o. male, DOB - 04-05-1955, TOI:[id number]  Admit date - 05/09/2018   Admitting Physician Orson Eva, MD  Outpatient Primary MD for the patient is Patient, No Pcp Per  Outpatient specialists:   LOS - 3  days   Medical records reviewed and are as summarized below:    Chief Complaint  Patient presents with  . Respiratory Distress       Brief summary   Patient is a 63 year old male with a history of diastolic CHF, CKD stage II, basal cell carcinoma status post partial rhinectomy and maxillectomy on 06/06/2017 presented with acute dyspnea that began on the morning of 05/09/2018 when he was admitted.  Denied any fever chills chest pain, nausea vomiting diarrhea, myalgias, abdominal pain or dysuria.  Reported chronic nonproductive cough, continues to smoke half pack per day.  No recent travels or any close contacts with COVID-19 patients. Upon arrival, patient was noted to have O2 sats of 80% on room air, was placed on CPAP initially and then on BiPAP.  In the setting of possible COVID, was placed on NRB.  Chest x-ray showed increased interstitial markings, bilateral opacities.  WBC is 18.7, creatinine 1.3, BNP 659 Troponin initially elevated at 0.25 then increased to 1.57.  Patient received 2 doses of furosemide, started on nitroglycerin drip, O2 demands have improved Patient was also noted to have hemoglobin of 7.4 in ED and was transfused 1 unit packed RBCs. Patient was transferred from [facility] to [facility] for further work-up.   Assessment & Plan    Principal Problem:   Acute hypoxemic respiratory failure (HCC)  secondary to lobar pneumonia, acute on chronic systolic and diastolic CHF -Initially on BiPAP, now weaned off of O2, sats 99% on room air.  Symptoms improved after Lasix.   -No fevers, influenza negative, respiratory virus panel  negative -Continue Zithromax, ceftriaxone.  LDH 122 -COVID-19 results Negative.  Low pretest probability and therefore will discontinue precautions.  Active Problems: Acute on chronic systolic and diastolic CHF -Symptoms improved after furosemide, 2D echo 12/2017 had shown EF of 45 to 50% with grade 2 diastolic dysfunction -Continue furosemide, daily weights, I's and O's -Creatinine stable at 1.3, cardiology following, echo canceled by cardiology due to covid results pending and no change in management Will add lisinopril.  Elevated troponins -Troponin 0.25, then trended up to 1.57 possibly due to demand ischemia secondary to acute hypoxia, CHF, now trending down -Cardiology consulted, patient had high risk Lexiscan in 10/2017 -Patient had multiple episodes of chest pain yesterday, on nitro drip, cardiology following, recommended medical management   acute on chronic iron deficiency anemia, advanced colonic adenoma, heme positive stools Colonoscopy 05/2017 had shown numerous polyps and polypoid mass in the hepatic flexure to the cecum area, pathology tubular adenoma Patient was referred to general surgery, Dr. Blake Divine for hemicolectomy however has been lost to follow-up.  Noncompliant with p.o. iron -Hemoglobin 7.4 on 4/8, was transfused 1 unit packed RBCs,  transfused again on 4/9.   -I discussed with GI, Nancy Marus on 4/9, with current respiratory failure, lobar pneumonia, Covid rule out, unable to do endoscopies inpatient unless actively hemorrhaging.  Recommended transfusion and outpatient follow-up with his gastroenterologist, Dr. Gala Romney -Patient received 1 unit of blood transfusion and 1 unit of IV Feraheme on 4/9, hemoglobin stable at 9.0 -COVID-19 testing is negative.  Discussed with Dr. Henrene Pastor, recommend outpatient follow-up with Dr. Gala Romney as well as Dr. Constance Haw general surgery for colectomy.  H&H stable no indication for inpatient procedure.  Malignant hypertension -BP better  today, continue metoprolol, Imdur, wean off nitroglycerin drip.    Basal cell carcinoma of nasolabial fold - Patient underwent right partial rhinectomy, resection of left intranasal lesion, skin graft at [facility] in 05/2017, margins were clear on pathology -Outpatient follow-up  History of right carotid stenosis -Follows Dr. Oneida Alar outpatient, continue medical therapy at this point.  Per Dr. Oneida Alar; not a candidate for carotid stenting and suboptimal candidate for endarterectomy.  Code Status: full code  DVT Prophylaxis:  Lovenox, but patient refusing  Family Communication: Patient does not have any family member.  Patient will notify friends.  Disposition Plan:   Time Spent in minutes  43mins   Procedures:  None   Consultants:   Cardiology GI   Antimicrobials:   Anti-infectives (From admission, onward)   Start     Dose/Rate Route Frequency Ordered Stop   05/09/18 1000  cefTRIAXone (ROCEPHIN) 2 g in sodium chloride 0.9 % 100 mL IVPB  Status:  Discontinued     2 g 200 mL/hr over 30 Minutes Intravenous Every 24 hours 05/09/18 0429 05/12/18 0759   05/09/18 0430  azithromycin (ZITHROMAX) 500 mg in sodium chloride 0.9 % 250 mL IVPB  Status:  Discontinued     500 mg 250 mL/hr over 60 Minutes Intravenous Every 24 hours 05/09/18 0429 05/12/18 0759   05/09/18 0145  vancomycin (VANCOCIN) 1,750 mg in sodium chloride 0.9 % 500 mL IVPB     1,750 mg 250 mL/hr over 120 Minutes Intravenous  Once 05/09/18 0140 05/09/18 0630   05/09/18 0145  ceFEPIme (MAXIPIME) 2 g in sodium chloride 0.9 % 100 mL IVPB     2 g 200 mL/hr over 30 Minutes Intravenous  Once 05/09/18 0140 05/09/18 0404         Medications  Scheduled Meds: . sodium chloride   Intravenous Once  . atorvastatin  40 mg Oral q1800  . furosemide  40 mg Oral Daily  . isosorbide mononitrate  15 mg Oral Daily  . lisinopril  5 mg Oral Daily  . metoprolol succinate  50 mg Oral Daily  . sodium chloride flush  3 mL Intravenous Q12H  .  vitamin B-12  1,000 mcg Oral Daily   Continuous Infusions: . sodium chloride     PRN Meds:.sodium chloride, acetaminophen, nitroGLYCERIN, sodium chloride flush      Subjective:   Still reports shortness of breath.  No nausea no vomiting.  Feeling better as compared to yesterday.  No diarrhea.  No dizziness.  Objective:   Vitals:   05/11/18 2223 05/12/18 0800 05/12/18 0849 05/12/18 1700  BP: (!) 135/59  (!) 152/69 (!) 144/72  Pulse:    72  Resp:    18  Temp:    98.8 F (37.1 C)  TempSrc:    Oral  SpO2:  99% 99% 100%  Weight:      Height:        Intake/Output Summary (Last 24  hours) at 05/12/2018 1903 Last data filed at 05/12/2018 1833 Gross per 24 hour  Intake 822 ml  Output 1025 ml  Net -203 ml     Wt Readings from Last 3 Encounters:  05/11/18 80.5 kg  04/12/18 81.2 kg  03/01/18 80.7 kg   Physical Exam  General: Alert and oriented x 3, NAD, comfortable  Eyes:   HEENT: Rhinectomy scar  Cardiovascular: S1 S2 clear, , RRR. No pedal edema b/l  Respiratory: Fairly CTAB  Gastrointestinal: Soft, nontender, nondistended, NBS  Ext: no pedal edema bilaterally  Neuro: no new deficits  Musculoskeletal: No cyanosis, clubbing  Skin: No rashes  Psych: Normal affect and demeanor, alert and oriented x3    Data Reviewed:  I have personally reviewed following labs and imaging studies  Micro Results Recent Results (from the past 240 hour(s))  Blood culture (routine x 2)     Status: None (Preliminary result)   Collection Time: 05/09/18  2:32 AM  Result Value Ref Range Status   Specimen Description BLOOD LEFT ANTECUBITAL  Final   Special Requests   Final    BOTTLES DRAWN AEROBIC AND ANAEROBIC Blood Culture adequate volume   Culture   Final    NO GROWTH 3 DAYS Performed at [facility], [address], [place], [place] [address]    Report Status PENDING  Incomplete  Blood culture (routine x 2)     Status: None (Preliminary result)   Collection Time: 05/09/18   2:34 AM  Result Value Ref Range Status   Specimen Description BLOOD RIGHT ANTECUBITAL  Final   Special Requests   Final    BOTTLES DRAWN AEROBIC AND ANAEROBIC Blood Culture adequate volume   Culture   Final    NO GROWTH 3 DAYS Performed at [facility], [address] [place], [place] [address]    Report Status PENDING  Incomplete  Novel Coronavirus, NAA (hospital order; send-out to ref lab)     Status: None   Collection Time: 05/09/18  4:21 AM  Result Value Ref Range Status   SARS-CoV-2, NAA NOT DETECTED NOT DETECTED Final    Comment: Negative (Not Detected) results do not exclude infection caused by SARS CoV 2 and should not be used as the sole basis for treatment or other patient management decisions. Optimum specimen types and timing for peak viral levels during infections caused  by SARS CoV 2 have not been determined. Collection of multiple specimens (types and time points) from the same patient may be necessary to detect the virus. Improper specimen collection and handling, sequence variability underlying assay primers and or probes, or the presence of organisms in  quantities less than the limit of detection of the assay may lead to false negative results. Positive and negative predictive values of testing are highly dependent on prevalence. False negative results are more likely when prevalence of disease is high. (NOTE) The expected result is Negative (Not Detected). The SARS CoV 2 test is intended for the presumptive qualitative  detection of nucleic acid from SARS CoV 2 in upper and lower  respir atory specimens. Testing methodology is real time RT PCR. Test results must be correlated with clinical presentation and  evaluated in the context of other laboratory and epidemiologic data.  Test performance can be affected because the epidemiology and  clinical spectrum of infection caused by SARS CoV 2 is not fully  known. For example, the optimum types of specimens to collect  and  when during the course of infection these  specimens are most likely  to contain detectable viral RNA may not be known. This test has not been Food and Drug Administration (FDA) cleared or  approved and has been authorized by FDA under an Emergency Use  Authorization (EUA). The test is only authorized for the duration of  the declaration that circumstances exist justifying the authorization  of emergency use of in vitro diagnostic tests for detection and or  diagnosis of SARS CoV 2 under Section 564(b)(1) of the Act, 21 U.S.C.  section [phone] 3(b)(1), unless the authorization is terminated or   revoked sooner. [facility] is certified under the  Clinical Laboratory Improvement Amendments of 1988 (CLIA), 42 U.S.C.  section [phone], to perform high complexity tests. Performed at [facility] [id number], [place], [place] [address] Laboratory Director: Loleta Books, MD Fact Sheet for Healthcare Providers  BankingDealers.co.za Fact Sheet for Patients  StrictlyIdeas.no Performed at [facility], [place] [address] [place], [place] [address]    Coronavirus Source NASOPHARYNGEAL  Final    Comment: Performed at [facility], [address], [place], [place] [address]  Respiratory Panel by PCR     Status: None   Collection Time: 05/09/18  4:21 AM  Result Value Ref Range Status   Adenovirus NOT DETECTED NOT DETECTED Final   Coronavirus 229E NOT DETECTED NOT DETECTED Final    Comment: (NOTE) The Coronavirus on the Respiratory Panel, DOES NOT test for the novel  Coronavirus (2019 nCoV)    Coronavirus HKU1 NOT DETECTED NOT DETECTED Final   Coronavirus NL63 NOT DETECTED NOT DETECTED Final   Coronavirus OC43 NOT DETECTED NOT DETECTED Final   Metapneumovirus NOT DETECTED NOT DETECTED Final   Rhinovirus / Enterovirus NOT DETECTED NOT DETECTED Final   Influenza A NOT  DETECTED NOT DETECTED Final   Influenza B NOT DETECTED NOT DETECTED Final   Parainfluenza Virus 1 NOT DETECTED NOT DETECTED Final   Parainfluenza Virus 2 NOT DETECTED NOT DETECTED Final   Parainfluenza Virus 3 NOT DETECTED NOT DETECTED Final   Parainfluenza Virus 4 NOT DETECTED NOT DETECTED Final   Respiratory Syncytial Virus NOT DETECTED NOT DETECTED Final   Bordetella pertussis NOT DETECTED NOT DETECTED Final   Chlamydophila pneumoniae NOT DETECTED NOT DETECTED Final   Mycoplasma pneumoniae NOT DETECTED NOT DETECTED Final    Comment: Performed at [facility], [place] [address], [place], [place] [address]    Radiology Reports Dg Chest Portable 1 View  Result Date: 05/09/2018 CLINICAL DATA:  Initial evaluation for acute shortness of breath. History of COPD, hypertension, smoking. EXAM: PORTABLE CHEST 1 VIEW COMPARISON:  Prior radiograph from 10/01/2017 FINDINGS: Mild cardiomegaly. Mediastinal silhouette normal. Lungs mildly hypoinflated. Bibasilar consolidative opacities, right greater than left, concerning for multifocal pneumonia. Underlying mild diffuse pulmonary interstitial congestion. No pleural effusion. No pneumothorax. No acute osseous finding. IMPRESSION: 1. Bibasilar consolidative opacities, right greater than left, concerning for multifocal pneumonia. 2. Underlying mild diffuse pulmonary interstitial edema. Electronically Signed   By: Jeannine Boga M.D.   On: 05/09/2018 02:08    Lab Data:  CBC: Recent Labs  Lab 05/09/18 0135  05/09/18 0850 05/10/18 0441 05/10/18 1850 05/11/18 0500 05/12/18 0313  WBC 18.7*  --  11.0* 15.4*  --  11.9* 10.9*  NEUTROABS 15.6*  --   --  10.5*  --   --   --   HGB 7.4*  --  8.2* 7.5* 9.2* 9.0* 9.3*  HCT 28.0*   < >  30.2* 26.2* 32.8* 32.5* 33.1*  MCV 67.0*  --  66.8* 66.2*  --  67.6* 67.3*  PLT 488*  --  498* 413*  --  464* 433*   < > = values in this interval not displayed.   Basic Metabolic Panel: Recent Labs  Lab 05/09/18  0135 05/10/18 0441 05/11/18 0500 05/12/18 0313 05/12/18 0839  NA 138 141 141 142  --   K 3.8 3.5 3.4* 3.3*  --   CL 106 107 106 104  --   CO2 22 23 24 24   --   GLUCOSE 149* 100* 100* 88  --   BUN 23 29* 24* 23  --   CREATININE 1.30* 1.39* 1.35* 1.26*  --   CALCIUM 9.0 8.9 9.0 9.2  --   MG 2.2  --   --   --  2.1   GFR: Estimated Creatinine Clearance: 65.9 mL/min (A) (by C-G formula based on SCr of 1.26 mg/dL (H)). Liver Function Tests: Recent Labs  Lab 05/09/18 0135 05/10/18 0441  AST 27 29  ALT 19 19  ALKPHOS 121 89  BILITOT 0.2* 0.4  PROT 7.4 6.4*  ALBUMIN 3.6 3.0*   No results for input(s): LIPASE, AMYLASE in the last 168 hours. No results for input(s): AMMONIA in the last 168 hours. Coagulation Profile: Recent Labs  Lab 05/09/18 0234  INR 1.1   Cardiac Enzymes: Recent Labs  Lab 05/09/18 0135 05/09/18 0234 05/09/18 0455 05/09/18 0850 05/10/18 1906  CKTOTAL  --  129  --  140  --   CKMB  --  12.2*  --   --   --   TROPONINI 0.25*  --  1.57*  --  0.62*   BNP (last 3 results) No results for input(s): PROBNP in the last 8760 hours. HbA1C: No results for input(s): HGBA1C in the last 72 hours. CBG: Recent Labs  Lab 05/10/18 0748  GLUCAP 81   Lipid Profile: No results for input(s): CHOL, HDL, LDLCALC, TRIG, CHOLHDL, LDLDIRECT in the last 72 hours. Thyroid Function Tests: No results for input(s): TSH, T4TOTAL, FREET4, T3FREE, THYROIDAB in the last 72 hours. Anemia Panel: No results for input(s): VITAMINB12, FOLATE, FERRITIN, TIBC, IRON, RETICCTPCT in the last 72 hours. Urine analysis:    Component Value Date/Time   COLORURINE STRAW (A) 05/09/2018 0717   APPEARANCEUR CLEAR 05/09/2018 0717   APPEARANCEUR Cloudy 04/23/2011 1044   LABSPEC 1.005 05/09/2018 0717   LABSPEC 1.018 04/23/2011 1044   PHURINE 6.0 05/09/2018 0717   GLUCOSEU NEGATIVE 05/09/2018 0717   GLUCOSEU Negative 04/23/2011 1044   HGBUR NEGATIVE 05/09/2018 0717   BILIRUBINUR NEGATIVE  05/09/2018 0717   BILIRUBINUR Negative 04/23/2011 1044   KETONESUR NEGATIVE 05/09/2018 0717   PROTEINUR NEGATIVE 05/09/2018 0717   NITRITE POSITIVE (A) 05/09/2018 0717   LEUKOCYTESUR SMALL (A) 05/09/2018 0717   LEUKOCYTESUR Trace 04/23/2011 1044   Author:  Berle Mull, MD [facility] 05/12/2018   To reach On-call, see care teams to locate the attending and reach out to them via [email or web address]. If 7PM-7AM, please contact night-coverage If you still have difficulty reaching the attending provider, please page the [facility] (Director on Call) for [facility] on amion for assistance.

## 2018-05-12 NOTE — Progress Notes (Addendum)
Progress Note  Patient Name: Michael [name] Date of Encounter: 05/12/2018  Primary Cardiologist: Rozann Lesches, MD   Subjective   No chest pain or SOB  Inpatient Medications    Scheduled Meds: . sodium chloride   Intravenous Once  . acetaminophen  650 mg Oral Once  . cyanocobalamin  1,000 mcg Intramuscular Once  . diphenhydrAMINE  25 mg Oral Once  . enoxaparin (LOVENOX) injection  80 mg Subcutaneous Q12H  . furosemide  40 mg Oral Daily  . hydrALAZINE  25 mg Oral Q8H  . isosorbide mononitrate  15 mg Oral Daily  . metoprolol tartrate  25 mg Oral BID  . sodium chloride flush  3 mL Intravenous Q12H  . vitamin B-12  1,000 mcg Oral Daily   Continuous Infusions: . sodium chloride    . azithromycin 500 mg (05/12/18 0548)  . cefTRIAXone (ROCEPHIN)  IV 2 g (05/11/18 0923)  . nitroGLYCERIN Stopped (05/11/18 2224)   PRN Meds: sodium chloride, acetaminophen, nitroGLYCERIN, sodium chloride flush   Vital Signs    Vitals:   05/11/18 1324 05/11/18 1708 05/11/18 2006 05/11/18 2223  BP: 135/77 (!) 149/89 (!) 165/94 (!) 135/59  Pulse:  68 69   Resp:   18   Temp:  97.8 F (36.6 C) 97.7 F (36.5 C)   TempSrc:  Oral Oral   SpO2:  99% 98%   Weight:      Height:        Intake/Output Summary (Last 24 hours) at 05/12/2018 0741 Last data filed at 05/12/2018 0547 Gross per 24 hour  Intake 340 ml  Output 2200 ml  Net -1860 ml   Last 3 Weights 05/11/2018 05/10/2018 05/09/2018  Weight (lbs) 177 lb 6.4 oz 181 lb 9.6 oz 182 lb 1.6 oz  Weight (kg) 80.468 kg 82.373 kg 82.6 kg      Telemetry    SR, short run NSVT - Personally Reviewed  ECG    na - Personally Reviewed  Physical Exam   GEN: No acute distress.   Neck: No JVD Cardiac: RRR, no murmurs, rubs, or gallops.  Respiratory: Clear to auscultation bilaterally. GI: Soft, nontender, non-distended  MS: No edema; No deformity. Neuro:  Nonfocal  Psych: Normal affect   Labs    Chemistry Recent Labs  Lab 05/09/18 0135  05/10/18 0441 05/11/18 0500 05/12/18 0313  NA 138 141 141 142  K 3.8 3.5 3.4* 3.3*  CL 106 107 106 104  CO2 22 23 24 24   GLUCOSE 149* 100* 100* 88  BUN 23 29* 24* 23  CREATININE 1.30* 1.39* 1.35* 1.26*  CALCIUM 9.0 8.9 9.0 9.2  PROT 7.4 6.4*  --   --   ALBUMIN 3.6 3.0*  --   --   AST 27 29  --   --   ALT 19 19  --   --   ALKPHOS 121 89  --   --   BILITOT 0.2* 0.4  --   --   GFRNONAA 58* 54* 55* >60  GFRAA >60 >60 >60 >60  ANIONGAP 10 11 11 14      Hematology Recent Labs  Lab 05/10/18 0441 05/10/18 1850 05/11/18 0500 05/12/18 0313  WBC 15.4*  --  11.9* 10.9*  RBC 3.96*  --  4.81 4.92  HGB 7.5* 9.2* 9.0* 9.3*  HCT 26.2* 32.8* 32.5* 33.1*  MCV 66.2*  --  67.6* 67.3*  MCH 18.9*  --  18.7* 18.9*  MCHC 28.6*  --  27.7* 28.1*  RDW 19.9*  --  21.6* 22.0*  PLT 413*  --  464* 433*    Cardiac Enzymes Recent Labs  Lab 05/09/18 0135 05/09/18 0455 05/10/18 1906  TROPONINI 0.25* 1.57* 0.62*   No results for input(s): TROPIPOC in the last 168 hours.   BNP Recent Labs  Lab 05/09/18 0135 05/12/18 0313  BNP 659.0* 594.6*     DDimer  Recent Labs  Lab 05/09/18 0234  DDIMER 2.81*     Radiology    No results found.  Cardiac Studies    Patient Profile  63 y.o. male history of chronic combined CHF with EF of 45 to 50% on Echo in 12/2017, right carotid artery stenosis, syncope, hypertension, COPD, CKD stage III,and right facial basal cell carcinoma s/p partial mastectomy and partial rhinectomy with skin graft in 5/2019who is being seen today for the evaluation ofCHF and elevated troponinat the request of Dr. Carles Collet.  Assessment & Plan    1. Dyspnea - multifactorial in setting of pneumonia, CHF, anemia. COVID 19 negative - initially on bipap, weaned to RA with sats 99% . Has been improving with diuresis, blood transfusion    2. Iron deficient anemia - heme + stools, advanced colonic adenoma - Colonoscopy 05/2017 had shown numerous polyps and polypoid mass in  the hepatic flexure to the cecum area, pathology tubular adenoma Patient was referred to general surgery, Dr. Blake Divine for hemicolectomy however has been lost to follow-up. - -Patient received 1 unit of blood transfusion and 1 unit of IV Feraheme on 4/9, hemoglobin stable at 9.0   3. Acute on chronic systolic/diastolic HF - 67/3419 echo LVEF 45-50%, grade II diastolic dysfunctin - neg 1.8 L yesterday, neg 4.2 L since admission. Received lasix 40mg  IV x 1 yesterday, downtrend in Cr with diuresis consistent with venous congestion and CHF - appears euvolemic today, agree with oral lasix today  4. Elevated troponin - peak 1.57, in setting of CHF, anemia, hypoxia in setting of likely chronic obstructive CAD based on prior stress testing.  - 10/2017 nuclear stress infarct with mild current ischemia  - would not pursue further testing due to likely demand ischemia and limitations on management due to heme + stools and anemia. Trop trending down.  -medical therapy with beta blocker, start statin. ASA if ok with GI  5. Pneumonia - abx per primary team   6. HTN - on lopressor, hydralazine, imdur. Weaned off NG drip -mild AKI on admit improving, may consider ACE/ARB pending further trend  7. NSVT - replace K to 4 and Mg to 2. K low this AM, added Mg to labs.     We will follow remotely tomorrow telemetry and chart review  For questions or updates, please contact [place] Please consult [email or web address] for contact info under        Signed, Carlyle Dolly, MD  05/12/2018, 7:41 AM

## 2018-05-13 LAB — CBC
HCT: 33.2 % — ABNORMAL LOW (ref 39.0–52.0)
Hemoglobin: 9.2 g/dL — ABNORMAL LOW (ref 13.0–17.0)
MCH: 19.2 pg — ABNORMAL LOW (ref 26.0–34.0)
MCHC: 27.7 g/dL — ABNORMAL LOW (ref 30.0–36.0)
MCV: 69.5 fL — ABNORMAL LOW (ref 80.0–100.0)
Platelets: 452 10*3/uL — ABNORMAL HIGH (ref 150–400)
RBC: 4.78 MIL/uL (ref 4.22–5.81)
RDW: 22.7 % — ABNORMAL HIGH (ref 11.5–15.5)
WBC: 10.7 10*3/uL — ABNORMAL HIGH (ref 4.0–10.5)
nRBC: 0 % (ref 0.0–0.2)

## 2018-05-13 LAB — BASIC METABOLIC PANEL
Anion gap: 9 (ref 5–15)
BUN: 24 mg/dL — ABNORMAL HIGH (ref 8–23)
CO2: 24 mmol/L (ref 22–32)
Calcium: 9.1 mg/dL (ref 8.9–10.3)
Chloride: 109 mmol/L (ref 98–111)
Creatinine, Ser: 1.26 mg/dL — ABNORMAL HIGH (ref 0.61–1.24)
GFR calc Af Amer: >60 mL/min (ref >60)
GFR calc non Af Amer: >60 mL/min (ref >60)
Glucose, Bld: 95 mg/dL (ref 70–99)
Potassium: 4.3 mmol/L (ref 3.5–5.1)
Sodium: 142 mmol/L (ref 135–145)

## 2018-05-13 MED ORDER — ATORVASTATIN CALCIUM 40 MG PO TABS: 40.0000 mg | ORAL_TABLET | Freq: Every day | ORAL | 0 refills | Status: DC

## 2018-05-13 MED ORDER — FUROSEMIDE 40 MG PO TABS: 40.0000 mg | ORAL_TABLET | Freq: Every day | ORAL | 0 refills | Status: DC

## 2018-05-13 MED ORDER — METOPROLOL SUCCINATE ER 50 MG PO TB24: 50.0000 mg | ORAL_TABLET | Freq: Every day | ORAL | 0 refills | Status: DC

## 2018-05-13 MED ORDER — ISOSORBIDE MONONITRATE ER 30 MG PO TB24: 15.0000 mg | ORAL_TABLET | Freq: Every day | ORAL | 0 refills | Status: DC

## 2018-05-13 MED ORDER — CYANOCOBALAMIN 1000 MCG PO TABS: 1000.0000 ug | ORAL_TABLET | Freq: Every day | ORAL | 0 refills | Status: AC

## 2018-05-13 MED ORDER — LISINOPRIL 5 MG PO TABS: 5.0000 mg | ORAL_TABLET | Freq: Every day | ORAL | 0 refills | Status: DC

## 2018-05-13 NOTE — Discharge Summary (Signed)
Physician Discharge Summary  Michael [name] BZJ:[id number] DOB: 1955/06/17 DOA: 05/09/2018  PCP: Patient, No Pcp Per  Admit date: 05/09/2018 Discharge date: 05/14/2018  Time spent: 40 minutes  Recommendations for Outpatient Follow-up:  1. Follow up outpatient CBC/CMP 2. Follow up with cardiology as outpatient 3. Follow up with GI and surgery as an outpatient   4. Follow up CXR as outpatient   Discharge Diagnoses:  Principal Problem:   Acute hypoxemic respiratory failure (Michael [name]) Active Problems:   Essential hypertension   Chronic diastolic CHF (congestive heart failure) (HCC)   Elevated troponin   CAP (community acquired pneumonia)   Hypertensive emergency   Acute on chronic combined systolic and diastolic CHF (congestive heart failure) (HCC)   Lobar pneumonia (HCC)   Acute on chronic combined systolic and diastolic congestive heart failure ([place])   Discharge Condition: stable  Diet recommendation: heart healthy  Filed Weights   05/10/18 0502 05/11/18 0445 05/13/18 0521  Weight: 82.4 kg 80.5 kg 80.1 kg    History of present illness:  Patient is Michael [name] 63 year old male with Michael [name] history of diastolic CHF, CKD stage II, basal cell carcinoma status post partial rhinectomy and maxillectomy on 06/06/2017 presented with acute dyspnea that began on the morning of 05/09/2018 when he was admitted.  Denied any fever chills chest pain, nausea vomiting diarrhea, myalgias, abdominal pain or dysuria.  Reported chronic nonproductive cough, continues to smoke half pack per day.  No recent travels or any close contacts with COVID-19 patients. Upon arrival, patient was noted to have O2 sats of 80% on room air, was placed on CPAP initially and then on BiPAP.  In the setting of possible COVID, was placed on NRB.  Chest x-ray showed increased interstitial markings, bilateral opacities.  WBC is 18.7, creatinine 1.3, BNP 659 Troponin initially elevated at 0.25 then increased to 1.57.  Patient received 2 doses of  furosemide, started on nitroglycerin drip, O2 demands have improved Patient was also noted to have hemoglobin of 7.4 in ED and was transfused 1 unit packed RBCs. Patient [facility] to [facility] for further work-up.  He was admitted for shortness of breath.  He was treated for pneumonia with 5 days of ceftriaxone/azithromycin.  He was negative for COVID.  He was noted to have an elevated troponin and cardiology was consulted (cardiology noted likely demand ischemia, recommended beta blocker, statin, ASA).  He had heme positive stools and was transfused 2 units pRBC.  Case was discussed with GI over the phone who recommended outpatient follow up.  See below for additional details  Hospital Course:  Acute hypoxemic respiratory failure ([place])  secondary to lobar pneumonia, acute on chronic systolic and diastolic CHF -Initially on BiPAP, now weaned off of O2, sats 99% on room air.  Symptoms improved after Lasix.   -No fevers, influenza negative, respiratory virus panel negative -S/p 5 days ceftriaxone/azithromycin.  LDH 122 -COVID-19 results Negative.  Low pretest probability and therefore will discontinue precautions.  Acute on chronic systolic and diastolic CHF -Symptoms improved after furosemide, 2D echo 12/2017 had shown EF of 45 to 50% with grade 2 diastolic dysfunction - Started on lisinopril/lasix at discharge  Elevated troponins -Troponin 0.25, then trended up to 1.57 possibly due to demand ischemia secondary to acute hypoxia, CHF, now trending down -Cardiology consulted due to elevated troponin, no recommendation for further testing due to likely demand ischemia.  Recommended beta blocker, statin, and ASA. - outpatient follow up  acute on chronic iron deficiency anemia, advanced colonic  adenoma, heme positive stools Colonoscopy 05/2017 had shown numerous polyps and polypoid mass in the hepatic flexure to the cecum area, pathology tubular adenoma Patient was referred to  general surgery, Dr. Blake Divine for hemicolectomy however has been lost to follow-up.  Noncompliant with p.o. iron -Hemoglobin 7.4 on 4/8, was transfused 1 unit packed RBCs, transfused again on 4/9.   -Previous provider discussed with GI, Nancy Marus on 4/9, with current respiratory failure, lobar pneumonia, Covid rule out, unable to do endoscopies inpatient unless actively hemorrhaging.  Recommended transfusion and outpatient follow-up with his gastroenterologist, Dr. Gala Romney -Patient received 1 unit of blood transfusion and 1 unit of IV Feraheme on 4/9, hemoglobin stable at 9.0 -COVID-19 testing is negative.  Previous provider discussed with Dr. Henrene Pastor, recommend outpatient follow-up with Dr. Gala Romney as well as Dr. Constance Haw general surgery for colectomy.  H&H stable no indication for inpatient procedure.  I discussed with  GI prior to d/c, ok with starting ASA 81 mg daily.   Malignant hypertension -BP better today, continue metoprolol, Imdur, weaned off nitroglycerin drip.    Basal cell carcinoma of nasolabial fold - Patient underwent right partial rhinectomy, resection of left intranasal lesion, skin graft at [facility] in 05/2017, margins were clear on pathology -Outpatient follow-up  History of right carotid stenosis -Follows Dr. Oneida Alar outpatient, continue medical therapy at this point.  Per Dr. Oneida Alar; not Michael [name] candidate for carotid stenting and suboptimal candidate for endarterectomy.  Procedures:  none  Consultations:  Cardiology  GI over phone  Discharge Exam: Vitals:   05/12/18 2054 05/13/18 0521  BP: (!) 152/67 (!) 148/85  Pulse: 76 76  Resp: 18 18  Temp: 98.1 F (36.7 C) 97.9 F (36.6 C)  SpO2: 100% 99%   Denies CP or SOB Feeling well. Ready for dc home today.  General: No acute distress. Cardiovascular: Heart sounds show Midas Daughety regular rate, and rhythm. Lungs: Clear to auscultation bilaterally Abdomen: Soft, nontender, nondistended Neurological: Alert and oriented 3.  Moves all extremities 4. Cranial nerves II through XII grossly intact. Skin: Warm and dry. No rashes or lesions. Extremities: No clubbing or cyanosis. No edema.   Discharge Instructions   Discharge Instructions    Call MD for:  difficulty breathing, headache or visual disturbances   Complete by:  As directed    Call MD for:  extreme fatigue   Complete by:  As directed    Call MD for:  hives   Complete by:  As directed    Call MD for:  persistant dizziness or light-headedness   Complete by:  As directed    Call MD for:  persistant nausea and vomiting   Complete by:  As directed    Call MD for:  redness, tenderness, or signs of infection (pain, swelling, redness, odor or green/yellow discharge around incision site)   Complete by:  As directed    Call MD for:  severe uncontrolled pain   Complete by:  As directed    Call MD for:  temperature >100.4   Complete by:  As directed    Diet - low sodium heart healthy   Complete by:  As directed    Diet - low sodium heart healthy   Complete by:  As directed    Discharge instructions   Complete by:  As directed    You were seen for pneumonia.  You were treated with antibiotics.  Your shortness of breath has improved.  You tested negative for COVID 19.  You had evidence of  heart failure with demand ischemia (damage to heart from stress of infection and heart failure).  You have been started on metoprolol, lipitor, and lisinopril.  Please continue these medications at discharge.  Please continue the aspirin, but watch for signs or symptoms of bleeding.  Please ensure that you have follow up with cardiology and gastroenterology.  Please follow up with Dr. Gala Romney and Dr. Constance Haw as an outpatient as previously planned.  Return for new, recurrent, or worsening symptoms.  Please ask your PCP to request records from this hospitalization so they know what was done and what the next steps will be.   Increase activity slowly   Complete by:  As  directed    Increase activity slowly   Complete by:  As directed      Allergies as of 05/13/2018   No Known Allergies     Medication List    TAKE these medications   aspirin EC 81 MG tablet Take 81 mg by mouth daily.   atorvastatin 40 MG tablet Commonly known as:  LIPITOR Take 1 tablet (40 mg total) by mouth daily at 6 PM for 30 days.   cyanocobalamin 1000 MCG tablet Take 1 tablet (1,000 mcg total) by mouth daily for 30 days.   furosemide 40 MG tablet Commonly known as:  LASIX Take 1 tablet (40 mg total) by mouth daily for 30 days.   isosorbide mononitrate 30 MG 24 hr tablet Commonly known as:  IMDUR Take 0.5 tablets (15 mg total) by mouth daily for 30 days.   lisinopril 5 MG tablet Commonly known as:  PRINIVIL,ZESTRIL Take 1 tablet (5 mg total) by mouth daily for 30 days.   metoprolol succinate 50 MG 24 hr tablet Commonly known as:  TOPROL-XL Take 1 tablet (50 mg total) by mouth daily for 30 days. Take with or immediately following Elfreda Blanchet meal. What changed:    medication strength  how much to take  additional instructions   nitroGLYCERIN 0.4 MG SL tablet Commonly known as:  NITROSTAT Place 1 tablet (0.4 mg total) under the tongue every 5 (five) minutes as needed for chest pain.      No Known Allergies Follow-up Information    Satira Sark, MD Follow up.   Specialty:  Cardiology Why:  The office will call you to arrange follow up with Dr. Domenic Polite or one of his PA or NPs in the next few weeks. It will likley be [place] virtual visit. please review instructions in discharge paperwork about virtual visits.  Contact information: [place] [address] [phone]        Daneil Dolin, MD Follow up.   Specialty:  Gastroenterology Why:  Call for follow up appointment Contact information: [address] [place] [address] [phone]        Virl Cagey, MD Follow up.   Specialty:  General Surgery Why:  Call for follow up  appointment Contact information: [address] Linna Hoff [facility] [address] [phone]            The results of significant diagnostics from this hospitalization (including imaging, microbiology, ancillary and laboratory) are listed below for reference.    Significant Diagnostic Studies: Dg Chest Portable 1 View  Result Date: 05/09/2018 CLINICAL DATA:  Initial evaluation for acute shortness of breath. History of COPD, hypertension, smoking. EXAM: PORTABLE CHEST 1 VIEW COMPARISON:  Prior radiograph from 10/01/2017 FINDINGS: Mild cardiomegaly. Mediastinal silhouette normal. Lungs mildly hypoinflated. Bibasilar consolidative opacities, right greater than left, concerning for  multifocal pneumonia. Underlying mild diffuse pulmonary interstitial congestion. No pleural effusion. No pneumothorax. No acute osseous finding. IMPRESSION: 1. Bibasilar consolidative opacities, right greater than left, concerning for multifocal pneumonia. 2. Underlying mild diffuse pulmonary interstitial edema. Electronically Signed   By: Jeannine Boga M.D.   On: 05/09/2018 02:08    Microbiology: Recent Results (from the past 240 hour(s))  Blood culture (routine x 2)     Status: None   Collection Time: 05/09/18  2:32 AM  Result Value Ref Range Status   Specimen Description BLOOD LEFT ANTECUBITAL  Final   Special Requests   Final    BOTTLES DRAWN AEROBIC AND ANAEROBIC Blood Culture adequate volume   Culture   Final    NO GROWTH 5 DAYS Performed at [facility], [address] [place], [place] [address]    Report Status 05/14/2018 FINAL  Final  Blood culture (routine x 2)     Status: None   Collection Time: 05/09/18  2:34 AM  Result Value Ref Range Status   Specimen Description BLOOD RIGHT ANTECUBITAL  Final   Special Requests   Final    BOTTLES DRAWN AEROBIC AND ANAEROBIC Blood Culture adequate volume   Culture   Final    NO GROWTH 5 DAYS Performed at [facility], [address], [place], [place]  [address]    Report Status 05/14/2018 FINAL  Final  Novel Coronavirus, NAA (hospital order; send-out to ref lab)     Status: None   Collection Time: 05/09/18  4:21 AM  Result Value Ref Range Status   SARS-CoV-2, NAA NOT DETECTED NOT DETECTED Final    Comment: Negative (Not Detected) results do not exclude infection caused by SARS CoV 2 and should not be used as the sole basis for treatment or other patient management decisions. Optimum specimen types and timing for peak viral levels during infections caused  by SARS CoV 2 have not been determined. Collection of multiple specimens (types and time points) from the same patient may be necessary to detect the virus. Improper specimen collection and handling, sequence variability underlying assay primers and or probes, or the presence of organisms in  quantities less than the limit of detection of the assay may lead to false negative results. Positive and negative predictive values of testing are highly dependent on prevalence. False negative results are more likely when prevalence of disease is high. (NOTE) The expected result is Negative (Not Detected). The SARS CoV 2 test is intended for the presumptive qualitative  detection of nucleic acid from SARS CoV 2 in upper and lower  respir atory specimens. Testing methodology is real time RT PCR. Test results must be correlated with clinical presentation and  evaluated in the context of other laboratory and epidemiologic data.  Test performance can be affected because the epidemiology and  clinical spectrum of infection caused by SARS CoV 2 is not fully  known. For example, the optimum types of specimens to collect and  when during the course of infection these specimens are most likely  to contain detectable viral RNA may not be known. This test has not been Food and Drug Administration (FDA) cleared or  approved and has been authorized by FDA under an Emergency Use  Authorization (EUA). The test is  only authorized for the duration of  the declaration that circumstances exist justifying the authorization  of emergency use of in vitro diagnostic tests for detection and or  diagnosis of SARS CoV 2 under Section 564(b)(1) of the Act,  21 U.S.C.  section 360bbb 3(b)(1), unless the authorization is terminated or   revoked sooner. [facility] is certified under the  Clinical Laboratory Improvement Amendments of 1988 (CLIA), 42 U.S.C.  section [phone], to perform high complexity tests. Performed at [facility] [id number], [place], [place] [address] Laboratory Director: Loleta Books, MD Fact Sheet for Healthcare Providers  BankingDealers.co.za Fact Sheet for Patients  StrictlyIdeas.no Performed at [facility], [place] [address], [place], [place] [address]    Coronavirus Source NASOPHARYNGEAL  Final    Comment: Performed at [facility], [address] [place], [place] [address]  Respiratory Panel by PCR     Status: None   Collection Time: 05/09/18  4:21 AM  Result Value Ref Range Status   Adenovirus NOT DETECTED NOT DETECTED Final   Coronavirus 229E NOT DETECTED NOT DETECTED Final    Comment: (NOTE) The Coronavirus on the Respiratory Panel, DOES NOT test for the novel  Coronavirus (2019 nCoV)    Coronavirus HKU1 NOT DETECTED NOT DETECTED Final   Coronavirus NL63 NOT DETECTED NOT DETECTED Final   Coronavirus OC43 NOT DETECTED NOT DETECTED Final   Metapneumovirus NOT DETECTED NOT DETECTED Final   Rhinovirus / Enterovirus NOT DETECTED NOT DETECTED Final   Influenza Melvyn Hommes NOT DETECTED NOT DETECTED Final   Influenza B NOT DETECTED NOT DETECTED Final   Parainfluenza Virus 1 NOT DETECTED NOT DETECTED Final   Parainfluenza Virus 2 NOT DETECTED NOT DETECTED Final   Parainfluenza Virus 3 NOT DETECTED NOT DETECTED Final   Parainfluenza Virus 4 NOT DETECTED NOT DETECTED  Final   Respiratory Syncytial Virus NOT DETECTED NOT DETECTED Final   Bordetella pertussis NOT DETECTED NOT DETECTED Final   Chlamydophila pneumoniae NOT DETECTED NOT DETECTED Final   Mycoplasma pneumoniae NOT DETECTED NOT DETECTED Final    Comment: Performed at [facility], [place] [address], [place], [place] [address]     Labs: Basic Metabolic Panel: Recent Labs  Lab 05/09/18 0135 05/10/18 0441 05/11/18 0500 05/12/18 0313 05/12/18 0839 05/13/18 0453  NA 138 141 141 142  --  142  K 3.8 3.5 3.4* 3.3*  --  4.3  CL 106 107 106 104  --  109  CO2 22 23 24 24   --  24  GLUCOSE 149* 100* 100* 88  --  95  BUN 23 29* 24* 23  --  24*  CREATININE 1.30* 1.39* 1.35* 1.26*  --  1.26*  CALCIUM 9.0 8.9 9.0 9.2  --  9.1  MG 2.2  --   --   --  2.1  --    Liver Function Tests: Recent Labs  Lab 05/09/18 0135 05/10/18 0441  AST 27 29  ALT 19 19  ALKPHOS 121 89  BILITOT 0.2* 0.4  PROT 7.4 6.4*  ALBUMIN 3.6 3.0*   No results for input(s): LIPASE, AMYLASE in the last 168 hours. No results for input(s): AMMONIA in the last 168 hours. CBC: Recent Labs  Lab 05/09/18 0135  05/09/18 0850 05/10/18 0441 05/10/18 1850 05/11/18 0500 05/12/18 0313 05/13/18 0453  WBC 18.7*  --  11.0* 15.4*  --  11.9* 10.9* 10.7*  NEUTROABS 15.6*  --   --  10.5*  --   --   --   --   HGB 7.4*  --  8.2* 7.5* 9.2* 9.0* 9.3* 9.2*  HCT 28.0*   < > 30.2* 26.2* 32.8* 32.5* 33.1* 33.2*  MCV  67.0*  --  66.8* 66.2*  --  67.6* 67.3* 69.5*  PLT 488*  --  498* 413*  --  464* 433* 452*   < > = values in this interval not displayed.   Cardiac Enzymes: Recent Labs  Lab 05/09/18 0135 05/09/18 0234 05/09/18 0455 05/09/18 0850 05/10/18 1906  CKTOTAL  --  129  --  140  --   CKMB  --  12.2*  --   --   --   TROPONINI 0.25*  --  1.57*  --  0.62*   BNP: BNP (last 3 results) Recent Labs    10/01/17 1218 05/09/18 0135 05/12/18 0313  BNP 455.0* 659.0* 594.6*    ProBNP (last 3 results) No results for input(s):  PROBNP in the last 8760 hours.  CBG: Recent Labs  Lab 05/10/18 0748  GLUCAP 81       Signed:  Fayrene Helper MD.  [facility] 05/14/2018, 8:35 AM

## 2018-05-13 NOTE — Plan of Care (Signed)

## 2018-05-13 NOTE — Discharge Instructions (Signed)

## 2018-05-13 NOTE — Progress Notes (Signed)
Chart and telemetry reviewed today, please see rounding note from yesterday. Continue current medical therapy, consider ASA when ok from GI standpoint. No recurrent NSVT by tele review (K 3.3 yesterday now normalized), continue to keep at 4 and Mg at 2. No further recs at this time, we will signoff inpatient care.    [facility] will sign off.   Medication Recommendations:  Continue current regimen Other recommendations (labs, testing, etc):  none Follow up as an outpatient:  We will arrange

## 2018-05-14 LAB — CULTURE, BLOOD (ROUTINE X 2)
Culture: NO GROWTH
Culture: NO GROWTH
Special Requests: ADEQUATE
Special Requests: ADEQUATE

## 2018-05-25 ENCOUNTER — Encounter: Payer: Self-pay | Admitting: Cardiology

## 2018-05-25 ENCOUNTER — Telehealth: Payer: Self-pay

## 2018-05-25 NOTE — Telephone Encounter (Signed)

## 2018-05-27 NOTE — Progress Notes (Signed)
Virtual Visit via Telephone Note   This visit type was conducted due to national recommendations for restrictions regarding the COVID-19 Pandemic (e.g. social distancing) in an effort to limit this patient's exposure and mitigate transmission in our community.  Due to his co-morbid illnesses, this patient is at least at moderate risk for complications without adequate follow up.  This format is felt to be most appropriate for this patient at this time.  The patient did not have access to video technology/had technical difficulties with video requiring transitioning to audio format only (telephone).  All issues noted in this document were discussed and addressed.  No physical exam could be performed with this format.  Please refer to the patient's chart for his  consent to telehealth for [facility].   Evaluation Performed:  Follow-up visit  Date:  05/28/2018   ID:  Michael [name], DOB Jul 28, 1955, MRN [id number]  Patient Location: Home Provider Location: Office  PCP:  [name], NP  Cardiologist:  Rozann Lesches, MD  Chief Complaint:   Hospital follow-up  History of Present Illness:    Michael [name] is a chronically ill 63 y.o. male last seen in March.  He did not have video access today and we spoke by phone.  Record review finds recent hospitalization in mid April with hypoxic respiratory failure in the setting of pneumonia and acute on chronic combined heart failure.  He was COVID-19 negative.  He was also found to be anemic with suspected chronic blood loss and also had elevated troponin suggesting demand ischemia.  He tells me that he feels "okay" since discharge.  Still mildly short of breath but overall better.  He does not report any angina symptoms or palpitations.  We went over his medications and noted the dose adjustments.  He is tolerating Toprol-XL at 50 mg daily.  Does not have access to a blood pressure cuff at this time.  Patient was seen by Dr. Oneida Alar with [facility] in  January.  Carotid imaging studies were reviewed including CT angiography which demonstrated a 70% heavily calcified RICA stenosis and no significant left-sided disease.  He was not felt to be a candidate for carotid stenting and suboptimal candidate for endarterectomy. Plan is for medical therapy at this point.  He is on aspirin and Lipitor.  The patient does not have symptoms concerning for COVID-19 infection (fever, chills, cough, or new shortness of breath).    Past Medical History:  Diagnosis Date  . Carotid artery disease ([place])   . Chronic diastolic heart failure ([place])   . CKD (chronic kidney disease) stage 3, GFR 30-59 ml/min (H[facility])   . COPD (chronic obstructive pulmonary disease) ([place])   . Essential hypertension   . Head and neck cancer ([place]) 2019   Right facial basal cell carcinoma s/p resection with right partial mastectomy and partal rhinectomy with skin graft (06/13/17)  . Iron deficiency anemia   . Urinary retention    Past Surgical History:  Procedure Laterality Date  . ANKLE CLOSED REDUCTION Right   . COLONOSCOPY N/A 05/31/2017   Procedure: COLONOSCOPY;  Surgeon: Daneil Dolin, MD;  [facility];  Service: Endoscopy;  Laterality: N/A;  2:45pm  . POLYPECTOMY  05/31/2017   Procedure: POLYPECTOMY;  Surgeon: Daneil Dolin, MD;  Location: [facility];  Service: Endoscopy;;  . TONSILLECTOMY       Current Meds  Medication Sig  . aspirin EC 81 MG tablet Take 81 mg by mouth daily.  Marland Kitchen atorvastatin (  LIPITOR) 40 MG tablet Take 1 tablet (40 mg total) by mouth daily at 6 PM for 30 days.  . furosemide (LASIX) 40 MG tablet Take 1 tablet (40 mg total) by mouth daily for 30 days.  . isosorbide mononitrate (IMDUR) 30 MG 24 hr tablet Take 0.5 tablets (15 mg total) by mouth daily for 30 days.  Marland Kitchen lisinopril (PRINIVIL,ZESTRIL) 5 MG tablet Take 1 tablet (5 mg total) by mouth daily for 30 days.  . metoprolol succinate (TOPROL-XL) 50 MG 24 hr tablet Take 1 tablet (50 mg total) by  mouth daily for 30 days. Take with or immediately following a meal.  . nitroGLYCERIN (NITROSTAT) 0.4 MG SL tablet Place 1 tablet (0.4 mg total) under the tongue every 5 (five) minutes as needed for chest pain.  . vitamin B-12 1000 MCG tablet Take 1 tablet (1,000 mcg total) by mouth daily for 30 days.     Allergies:   Patient has no known allergies.   Social History   Tobacco Use  . Smoking status: Current Every Day Smoker    Packs/day: 0.50    Years: 40.00    Pack years: 20.00    Types: Cigarettes  . Smokeless tobacco: Never Used  Substance Use Topics  . Alcohol use: Not Currently    Frequency: Never  . Drug use: Never     Family Hx: The patient's family history includes Cirrhosis in his mother; Stroke in his father. There is no history of Colon cancer.  ROS:   Please see the history of present illness.    All other systems reviewed and are negative.   Prior CV studies:   The following studies were reviewed today:  Echocardiogram 12/05/2017: Study Conclusions  - Left ventricle: The cavity size was normal. Wall thickness was increased in a pattern of mild LVH. Systolic function was mildly reduced. The estimated ejection fraction was in the range of 45% to 50%. There is hypokinesis of the basal-midinferolateral myocardium. Features are consistent with a pseudonormal left ventricular filling pattern, with concomitant abnormal relaxation and increased filling pressure (grade 2 diastolic dysfunction). - Aortic valve: Mildly calcified annulus. Trileaflet; mildly calcified leaflets. - Mitral valve: Mildly calcified annulus. There was mild regurgitation. - Left atrium: The atrium was mildly dilated. - Tricuspid valve: There was trivial regurgitation. - Pulmonary arteries: Systolic pressure could not be accurately estimated. - Pericardium, extracardiac: A trivial pericardial effusion was identified anterior to the heart.  Lexiscan Myoview 10/19/2017:   Findings consistent with prior inferior/septal myocardial infarction with mild peri-infarct ischemia.  This is a high risk study. Risk primarily based on decreased LVEF. There is a prior infarct with only mild current ischemia. Consider correlating LVEF with echocardiogram.  The left ventricular ejection fraction is moderately decreased (33%).  Labs/Other Tests and Data Reviewed:    EKG:  An ECG dated 05/10/2018 was personally reviewed today and demonstrated:  Sinus rhythm with prolonged PR interval and LVH, anterolateral T wave inversions and prolonged QT interval, consider ischemia.  Recent Labs: 05/10/2018: ALT 19 05/12/2018: B Natriuretic Peptide 594.6; Magnesium 2.1 05/13/2018: BUN 24; Creatinine, Ser 1.26; Hemoglobin 9.2; Platelets 452; Potassium 4.3; Sodium 142   Recent Lipid Panel Lab Results  Component Value Date/Time   CHOL 194 04/02/2017 05:41 AM   TRIG 49 04/02/2017 05:41 AM   HDL 42 04/02/2017 05:41 AM   CHOLHDL 4.6 04/02/2017 05:41 AM   LDLCALC 142 (H) 04/02/2017 05:41 AM    Wt Readings from Last 3 Encounters:  05/25/18 178 lb (80.7  kg)  05/13/18 176 lb 9.6 oz (80.1 kg)  04/12/18 179 lb (81.2 kg)     Objective:    Vital Signs:  Ht 6' (1.829 m)   Wt 178 lb (80.7 kg)   BMI 24.14 kg/m    Patient was not short of breath speaking in full sentences on the phone.  His speech pattern and tone are chronically abnormal as before status post ENT surgery.  ASSESSMENT & PLAN:    1.  Essential hypertension.  He was not able to provide a blood pressure today.  Based on progress notes just prior to hospital discharge his systolic was ranging 166-060.  He is currently on Toprol-XL and lisinopril.  2.  Secondary cardiomyopathy with mild LV dysfunction.  Most recent follow-up echocardiogram shows LVEF 45 to 50%.  3.  Asymptomatic carotid artery disease as outlined above.  Continues on aspirin and statin.  4.  CKD stage III, recent creatinine 1.26.  COVID-19 Education: The  signs and symptoms of COVID-19 were discussed with the patient and how to seek care for testing (follow up with PCP or arrange E-visit).  The importance of social distancing was discussed today.  Time:   Today, I have spent 7 minutes with the patient with telehealth technology discussing the above problems.     Medication Adjustments/Labs and Tests Ordered: Current medicines are reviewed at length with the patient today.  Concerns regarding medicines are outlined above.   Tests Ordered: No orders of the defined types were placed in this encounter.   Medication Changes: No orders of the defined types were placed in this encounter.   Disposition:  Follow up 3 months in the [place] office.  Signed, Rozann Lesches, MD  05/28/2018 1:47 PM     [facility]

## 2018-05-28 ENCOUNTER — Encounter: Payer: Self-pay | Admitting: Cardiology

## 2018-05-28 ENCOUNTER — Telehealth (INDEPENDENT_AMBULATORY_CARE_PROVIDER_SITE_OTHER): Payer: Medicaid Other | Admitting: Cardiology

## 2018-05-28 VITALS — Ht 72.0 in | Wt 178.0 lb

## 2018-05-28 DIAGNOSIS — I10 Essential (primary) hypertension: Secondary | ICD-10-CM

## 2018-05-28 DIAGNOSIS — I6523 Occlusion and stenosis of bilateral carotid arteries: Secondary | ICD-10-CM

## 2018-05-28 DIAGNOSIS — I5042 Chronic combined systolic (congestive) and diastolic (congestive) heart failure: Secondary | ICD-10-CM

## 2018-05-28 DIAGNOSIS — I259 Chronic ischemic heart disease, unspecified: Secondary | ICD-10-CM

## 2018-05-28 DIAGNOSIS — I429 Cardiomyopathy, unspecified: Secondary | ICD-10-CM | POA: Diagnosis not present

## 2018-05-28 DIAGNOSIS — Z7189 Other specified counseling: Secondary | ICD-10-CM | POA: Diagnosis not present

## 2018-05-28 NOTE — Patient Instructions (Signed)
Medication Instructions: Your physician recommends that you continue on your current medications as directed. Please refer to the Current Medication list given to you today.   Labwork: None today  Procedures/Testing: None today  Follow-Up: 3 months with Dr.McDowell  Any Additional Special Instructions Will Be Listed Below (If Applicable).     If you need a refill on your cardiac medications before your next appointment, please call your pharmacy.      Thank you for choosing [facility] !

## 2018-06-22 ENCOUNTER — Ambulatory Visit (INDEPENDENT_AMBULATORY_CARE_PROVIDER_SITE_OTHER): Payer: Medicaid Other | Admitting: Urology

## 2018-06-22 ENCOUNTER — Other Ambulatory Visit (HOSPITAL_COMMUNITY)
Admission: AD | Admit: 2018-06-22 | Discharge: 2018-06-22 | Disposition: A | Discharge status: Home / Self Care | Payer: Medicaid Other | Source: Other Acute Inpatient Hospital | Attending: Urology | Admitting: Urology

## 2018-06-22 DIAGNOSIS — N210 Calculus in bladder: Secondary | ICD-10-CM | POA: Diagnosis not present

## 2018-06-22 DIAGNOSIS — R972 Elevated prostate specific antigen [PSA]: Secondary | ICD-10-CM

## 2018-06-22 DIAGNOSIS — D4101 Neoplasm of uncertain behavior of right kidney: Secondary | ICD-10-CM | POA: Diagnosis not present

## 2018-06-22 DIAGNOSIS — N401 Enlarged prostate with lower urinary tract symptoms: Secondary | ICD-10-CM

## 2018-06-22 DIAGNOSIS — R300 Dysuria: Secondary | ICD-10-CM | POA: Insufficient documentation

## 2018-06-22 DIAGNOSIS — N130 Hydronephrosis with ureteropelvic junction obstruction: Secondary | ICD-10-CM

## 2018-06-22 LAB — URINALYSIS, COMPLETE (UACMP) WITH MICROSCOPIC
Bilirubin Urine: NEGATIVE
Glucose, UA: NEGATIVE mg/dL
Hgb urine dipstick: NEGATIVE
Ketones, ur: NEGATIVE mg/dL
Nitrite: NEGATIVE
Protein, ur: 100 mg/dL — AB
Specific Gravity, Urine: 1.015 (ref 1.005–1.030)
WBC, UA: >50 WBC/hpf — ABNORMAL HIGH (ref 0–5)
pH: 8 (ref 5.0–8.0)

## 2018-06-26 LAB — URINE CULTURE: Culture: >=100000 — AB

## 2018-07-03 ENCOUNTER — Ambulatory Visit: Payer: Medicaid Other | Admitting: Cardiology

## 2018-07-06 ENCOUNTER — Other Ambulatory Visit: Payer: Self-pay | Admitting: Urology

## 2018-07-06 ENCOUNTER — Other Ambulatory Visit (HOSPITAL_COMMUNITY): Payer: Self-pay | Admitting: Urology

## 2018-07-06 DIAGNOSIS — N210 Calculus in bladder: Secondary | ICD-10-CM

## 2018-07-06 DIAGNOSIS — D4101 Neoplasm of uncertain behavior of right kidney: Secondary | ICD-10-CM

## 2018-07-11 ENCOUNTER — Ambulatory Visit (INDEPENDENT_AMBULATORY_CARE_PROVIDER_SITE_OTHER): Payer: Medicaid Other | Admitting: Urology

## 2018-07-11 DIAGNOSIS — N401 Enlarged prostate with lower urinary tract symptoms: Secondary | ICD-10-CM

## 2018-07-25 ENCOUNTER — Other Ambulatory Visit (HOSPITAL_COMMUNITY): Payer: Self-pay | Admitting: Urology

## 2018-07-25 DIAGNOSIS — R972 Elevated prostate specific antigen [PSA]: Secondary | ICD-10-CM

## 2018-07-30 ENCOUNTER — Ambulatory Visit (HOSPITAL_COMMUNITY)
Admission: RE | Admit: 2018-07-30 | Discharge: 2018-07-30 | Disposition: A | Discharge status: Home / Self Care | Payer: Medicaid Other | Source: Ambulatory Visit | Attending: Urology | Admitting: Urology

## 2018-07-30 ENCOUNTER — Other Ambulatory Visit: Payer: Self-pay

## 2018-07-30 DIAGNOSIS — D4101 Neoplasm of uncertain behavior of right kidney: Secondary | ICD-10-CM | POA: Diagnosis present

## 2018-07-30 DIAGNOSIS — N210 Calculus in bladder: Secondary | ICD-10-CM | POA: Insufficient documentation

## 2018-07-30 LAB — POCT I-STAT CREATININE: Creatinine, Ser: 1.2 mg/dL (ref 0.61–1.24)

## 2018-07-30 MED ORDER — IOPAMIDOL (ISOVUE-370) INJECTION 76%
150.0000 mL | Freq: Once | INTRAVENOUS | Status: AC | PRN
  Administered 2018-07-30: 09:00:00 125 mL via INTRAVENOUS

## 2018-08-01 ENCOUNTER — Other Ambulatory Visit: Payer: Self-pay

## 2018-08-01 ENCOUNTER — Ambulatory Visit (HOSPITAL_COMMUNITY)
Admission: RE | Admit: 2018-08-01 | Discharge: 2018-08-01 | Disposition: A | Discharge status: Home / Self Care | Payer: Medicaid Other | Source: Ambulatory Visit | Attending: Urology | Admitting: Urology

## 2018-08-01 ENCOUNTER — Encounter (HOSPITAL_COMMUNITY): Payer: Self-pay

## 2018-08-01 DIAGNOSIS — R351 Nocturia: Secondary | ICD-10-CM | POA: Diagnosis not present

## 2018-08-01 DIAGNOSIS — N401 Enlarged prostate with lower urinary tract symptoms: Secondary | ICD-10-CM | POA: Insufficient documentation

## 2018-08-01 DIAGNOSIS — R972 Elevated prostate specific antigen [PSA]: Secondary | ICD-10-CM | POA: Diagnosis not present

## 2018-08-01 MED ORDER — LIDOCAINE HCL (PF) 2 % IJ SOLN
INTRAMUSCULAR | Status: AC
  Administered 2018-08-01: 12:00:00 10 mL
  Filled 2018-08-01: qty 10

## 2018-08-01 MED ORDER — LIDOCAINE HCL (PF) 2 % IJ SOLN
10.0000 mL | Freq: Once | INTRAMUSCULAR | Status: AC
  Administered 2018-08-01: 10 mL

## 2018-08-01 MED ORDER — GENTAMICIN SULFATE 40 MG/ML IJ SOLN
INTRAMUSCULAR | Status: AC
  Administered 2018-08-01: 12:00:00 80 mg via INTRAMUSCULAR
  Filled 2018-08-01: qty 2

## 2018-08-01 MED ORDER — GENTAMICIN SULFATE 40 MG/ML IJ SOLN
80.0000 mg | Freq: Once | INTRAMUSCULAR | Status: AC
  Administered 2018-08-01: 80 mg via INTRAMUSCULAR

## 2018-08-22 ENCOUNTER — Institutional Professional Consult (permissible substitution): Payer: Medicaid Other | Admitting: Urology

## 2018-08-27 ENCOUNTER — Encounter: Payer: Medicaid Other | Admitting: Cardiology

## 2018-08-27 NOTE — Progress Notes (Signed)
This encounter was created in error - please disregard.

## 2018-09-05 ENCOUNTER — Ambulatory Visit (INDEPENDENT_AMBULATORY_CARE_PROVIDER_SITE_OTHER): Payer: Medicaid Other | Admitting: Urology

## 2018-09-05 DIAGNOSIS — N401 Enlarged prostate with lower urinary tract symptoms: Secondary | ICD-10-CM | POA: Diagnosis not present

## 2018-09-06 ENCOUNTER — Other Ambulatory Visit: Payer: Self-pay | Admitting: Urology

## 2018-10-23 NOTE — Patient Instructions (Addendum)
DUE TO COVID-19 ONLY ONE VISITOR IS ALLOWED TO COME WITH YOU AND STAY IN THE WAITING ROOM ONLY DURING PRE OP AND PROCEDURE DAY OF SURGERY. THE 1 VISITOR MAY VISIT WITH YOU AFTER SURGERY IN YOUR PRIVATE ROOM DURING VISITING HOURS ONLY!  YOU NEED TO HAVE A COVID 19 TEST ON 10-25-2018 at [facility] short stay entrance on [address] at  At 900 am. THIS TEST MUST BE DONE BEFORE SURGERY, ONCE YOUR COVID TEST IS COMPLETED, PLEASE BEGIN THE QUARANTINE INSTRUCTIONS AS OUTLINED IN YOUR HANDOUT.                Ibrahim Knipe    Your procedure is scheduled on: 10-29-18    Report to [facility] Main  Entrance    Report to Admitting at 10:15 AM   Call this number if you have problems the morning of surgery [phone]    Please consume a Clear Liquid Diet the Day of Prep 10-28-2018, no clear liquids after midnight.    CLEAR LIQUID DIET   Foods Allowed                                                                     Foods Excluded  Coffee and tea, regular and decaf                             liquids that you cannot  Plain Jell-O any favor except red or purple                                           see through such as: Fruit ices (not with fruit pulp)                                     milk, soups, orange juice  Iced Popsicles                                    All solid food Carbonated beverages, regular and diet                                    Cranberry, grape and apple juices Sports drinks like Gatorade Lightly seasoned clear broth or consume(fat free) Sugar, honey syrup   _____________________________________________________________________          Take these medicines the morning of surgery with A SIP OF WATER: none  BRUSH YOUR TEETH MORNING OF SURGERY AND RINSE YOUR MOUTH OUT, NO CHEWING GUM CANDY OR MINTS.                                 You may not have any metal on your body including hair pins and              piercings    Do not wear jewelry,  cologne,  lotions, powders or deodorant                         Men may shave face and neck.   Do not bring valuables to the hospital. [place].  Contacts, dentures or bridgework may not be worn into surgery.  Leave suitcase in the car. After surgery it may be brought to your room.      Special Instructions: N/A              Please read over the following fact sheets you were given: _____________________________________________________________________             [facility] - Preparing for Surgery Before surgery, you can play an important role.  Because skin is not sterile, your skin needs to be as free of germs as possible.  You can reduce the number of germs on your skin by washing with CHG (chlorahexidine gluconate) soap before surgery.  CHG is an antiseptic cleaner which kills germs and bonds with the skin to continue killing germs even after washing. Please DO NOT use if you have an allergy to CHG or antibacterial soaps.  If your skin becomes reddened/irritated stop using the CHG and inform your nurse when you arrive at Short Stay. Do not shave (including legs and underarms) for at least 48 hours prior to the first CHG shower.  You may shave your face/neck. Please follow these instructions carefully:  1.  Shower with CHG Soap the night before surgery and the  morning of Surgery.  2.  If you choose to wash your hair, wash your hair first as usual with your  normal  shampoo.  3.  After you shampoo, rinse your hair and body thoroughly to remove the  shampoo.                           4.  Use CHG as you would any other liquid soap.  You can apply chg directly  to the skin and wash                       Gently with a scrungie or clean washcloth.  5.  Apply the CHG Soap to your body ONLY FROM THE NECK DOWN.   Do not use on face/ open                           Wound or open sores. Avoid contact with eyes, ears mouth and genitals (private parts).                        Wash face,  Genitals (private parts) with your normal soap.             6.  Wash thoroughly, paying special attention to the area where your surgery  will be performed.  7.  Thoroughly rinse your body with warm water from the neck down.  8.  DO NOT shower/wash with your normal soap after using and rinsing off  the CHG Soap.                9.  Pat yourself dry with a clean towel.            10.  Wear clean pajamas.            11.  Place clean sheets on your bed the night of your first shower and do not  sleep with pets. Day of Surgery : Do not apply any lotions/deodorants the morning of surgery.  Please wear clean clothes to the hospital/surgery center.  FAILURE TO FOLLOW THESE INSTRUCTIONS MAY RESULT IN THE CANCELLATION OF YOUR SURGERY PATIENT SIGNATURE_________________________________  NURSE SIGNATURE__________________________________  ________________________________________________________________________

## 2018-10-24 ENCOUNTER — Encounter (HOSPITAL_COMMUNITY): Payer: Self-pay

## 2018-10-24 ENCOUNTER — Encounter (HOSPITAL_COMMUNITY)
Admission: RE | Admit: 2018-10-24 | Discharge: 2018-10-24 | Disposition: A | Discharge status: Home / Self Care | Payer: Medicaid Other | Source: Ambulatory Visit | Attending: Urology | Admitting: Urology

## 2018-10-24 ENCOUNTER — Other Ambulatory Visit: Payer: Self-pay

## 2018-10-24 DIAGNOSIS — J449 Chronic obstructive pulmonary disease, unspecified: Secondary | ICD-10-CM | POA: Insufficient documentation

## 2018-10-24 DIAGNOSIS — Z8589 Personal history of malignant neoplasm of other organs and systems: Secondary | ICD-10-CM | POA: Diagnosis not present

## 2018-10-24 DIAGNOSIS — I6523 Occlusion and stenosis of bilateral carotid arteries: Secondary | ICD-10-CM | POA: Insufficient documentation

## 2018-10-24 DIAGNOSIS — Z7982 Long term (current) use of aspirin: Secondary | ICD-10-CM | POA: Insufficient documentation

## 2018-10-24 DIAGNOSIS — Z79899 Other long term (current) drug therapy: Secondary | ICD-10-CM | POA: Diagnosis not present

## 2018-10-24 DIAGNOSIS — N210 Calculus in bladder: Secondary | ICD-10-CM | POA: Insufficient documentation

## 2018-10-24 DIAGNOSIS — F17200 Nicotine dependence, unspecified, uncomplicated: Secondary | ICD-10-CM | POA: Insufficient documentation

## 2018-10-24 DIAGNOSIS — I5032 Chronic diastolic (congestive) heart failure: Secondary | ICD-10-CM | POA: Diagnosis not present

## 2018-10-24 DIAGNOSIS — Z01812 Encounter for preprocedural laboratory examination: Secondary | ICD-10-CM | POA: Diagnosis not present

## 2018-10-24 DIAGNOSIS — N400 Enlarged prostate without lower urinary tract symptoms: Secondary | ICD-10-CM | POA: Insufficient documentation

## 2018-10-24 DIAGNOSIS — I130 Hypertensive heart and chronic kidney disease with heart failure and stage 1 through stage 4 chronic kidney disease, or unspecified chronic kidney disease: Secondary | ICD-10-CM | POA: Diagnosis not present

## 2018-10-24 DIAGNOSIS — R339 Retention of urine, unspecified: Secondary | ICD-10-CM | POA: Diagnosis not present

## 2018-10-24 DIAGNOSIS — I080 Rheumatic disorders of both mitral and aortic valves: Secondary | ICD-10-CM | POA: Insufficient documentation

## 2018-10-24 DIAGNOSIS — N183 Chronic kidney disease, stage 3 (moderate): Secondary | ICD-10-CM | POA: Insufficient documentation

## 2018-10-24 HISTORY — DX: Atherosclerotic heart disease of native coronary artery without angina pectoris: I25.10

## 2018-10-24 LAB — CBC
HCT: 31.8 % — ABNORMAL LOW (ref 39.0–52.0)
Hemoglobin: 8.4 g/dL — ABNORMAL LOW (ref 13.0–17.0)
MCH: 18.1 pg — ABNORMAL LOW (ref 26.0–34.0)
MCHC: 26.4 g/dL — ABNORMAL LOW (ref 30.0–36.0)
MCV: 68.4 fL — ABNORMAL LOW (ref 80.0–100.0)
Platelets: 462 10*3/uL — ABNORMAL HIGH (ref 150–400)
RBC: 4.65 MIL/uL (ref 4.22–5.81)
RDW: 18.3 % — ABNORMAL HIGH (ref 11.5–15.5)
WBC: 10.1 10*3/uL (ref 4.0–10.5)
nRBC: 0 % (ref 0.0–0.2)

## 2018-10-24 LAB — BASIC METABOLIC PANEL
Anion gap: 9 (ref 5–15)
BUN: 19 mg/dL (ref 8–23)
CO2: 25 mmol/L (ref 22–32)
Calcium: 9 mg/dL (ref 8.9–10.3)
Chloride: 107 mmol/L (ref 98–111)
Creatinine, Ser: 1.21 mg/dL (ref 0.61–1.24)
GFR calc Af Amer: >60 mL/min (ref >60)
GFR calc non Af Amer: >60 mL/min (ref >60)
Glucose, Bld: 91 mg/dL (ref 70–99)
Potassium: 3.7 mmol/L (ref 3.5–5.1)
Sodium: 141 mmol/L (ref 135–145)

## 2018-10-24 NOTE — Progress Notes (Signed)
PCP - Rosealee Albee [place] Cardiologist - dr Ree Edman 05-28-18 epic (follow up in 3 months per note)  Chest x-ray - 05-09-18 epic EKG - 05-10-18 epic Stress Test none ECHO - 12-05-17 epic Cardiac Cath - none  Sleep Study - none CPAP - none  Fasting Blood Sugar - n/a Checks Blood Sugar _____ times a day  Blood Thinner Instructions:none Aspirin Instructions:none Last Dose:  Anesthesia review: patient made aware need to call dr Domenic Polite for cardiac clearance due to elevated blood pressure for 10-29-18 surgery per Janett Billow zanetto pa. Chart to [place] zanetto for review  Patient denies shortness of breath, fever, cough and chest pain at PAT appointment   Patient verbalized understanding of instructions that were given to them at the PAT appointment. Patient was also instructed that they will need to review over the PAT instructions again at home before surgery.

## 2018-10-25 ENCOUNTER — Encounter: Payer: Self-pay | Admitting: Cardiology

## 2018-10-25 ENCOUNTER — Other Ambulatory Visit (HOSPITAL_COMMUNITY)
Admission: RE | Admit: 2018-10-25 | Discharge: 2018-10-25 | Disposition: A | Discharge status: Home / Self Care | Payer: Medicaid Other | Source: Ambulatory Visit | Attending: Urology | Admitting: Urology

## 2018-10-25 ENCOUNTER — Ambulatory Visit (INDEPENDENT_AMBULATORY_CARE_PROVIDER_SITE_OTHER): Payer: Medicaid Other | Admitting: Cardiology

## 2018-10-25 VITALS — BP 132/78 | HR 79 | Temp 97.7°F | Ht 72.0 in | Wt 201.0 lb

## 2018-10-25 DIAGNOSIS — I6523 Occlusion and stenosis of bilateral carotid arteries: Secondary | ICD-10-CM

## 2018-10-25 DIAGNOSIS — I259 Chronic ischemic heart disease, unspecified: Secondary | ICD-10-CM | POA: Diagnosis not present

## 2018-10-25 DIAGNOSIS — Z01812 Encounter for preprocedural laboratory examination: Secondary | ICD-10-CM | POA: Diagnosis present

## 2018-10-25 DIAGNOSIS — Z01810 Encounter for preprocedural cardiovascular examination: Secondary | ICD-10-CM

## 2018-10-25 DIAGNOSIS — Z20828 Contact with and (suspected) exposure to other viral communicable diseases: Secondary | ICD-10-CM | POA: Insufficient documentation

## 2018-10-25 DIAGNOSIS — I429 Cardiomyopathy, unspecified: Secondary | ICD-10-CM | POA: Diagnosis not present

## 2018-10-25 LAB — SARS CORONAVIRUS 2 (TAT 6-24 HRS): SARS Coronavirus 2: NEGATIVE

## 2018-10-25 MED ORDER — ATORVASTATIN CALCIUM 40 MG PO TABS: 40.0000 mg | ORAL_TABLET | Freq: Every evening | ORAL | 3 refills | Status: DC

## 2018-10-25 MED ORDER — METOPROLOL SUCCINATE ER 25 MG PO TB24: 25.0000 mg | ORAL_TABLET | Freq: Every evening | ORAL | 3 refills | Status: DC

## 2018-10-25 MED ORDER — ISOSORBIDE MONONITRATE ER 30 MG PO TB24: 30.0000 mg | ORAL_TABLET | Freq: Every evening | ORAL | 3 refills | Status: DC

## 2018-10-25 MED ORDER — FUROSEMIDE 40 MG PO TABS: 40.0000 mg | ORAL_TABLET | Freq: Every evening | ORAL | 3 refills | Status: DC

## 2018-10-25 NOTE — Anesthesia Preprocedure Evaluation (Addendum)
Anesthesia Evaluation  Patient identified by MRN, date of birth, ID band Patient awake    Reviewed: Allergy & Precautions, NPO status , Patient's Chart, lab work & pertinent test results  Airway Mallampati: II  TM Distance: >3 FB Neck ROM: Full  Mouth opening: Limited Mouth Opening  Dental  (+) Edentulous Upper   Pulmonary Current Smoker,    breath sounds clear to auscultation       Cardiovascular hypertension,  Rhythm:Regular Rate:Normal     Neuro/Psych    GI/Hepatic   Endo/Other    Renal/GU      Musculoskeletal   Abdominal   Peds  Hematology   Anesthesia Other Findings   Reproductive/Obstetrics                            Anesthesia Physical Anesthesia Plan  ASA: III  Anesthesia Plan: General   Post-op Pain Management:    Induction: Intravenous  PONV Risk Score and Plan: Ondansetron  Airway Management Planned: Oral ETT  Additional Equipment: Arterial line  Intra-op Plan:   Post-operative Plan: Extubation in OR  Informed Consent: I have reviewed the patients History and Physical, chart, labs and discussed the procedure including the risks, benefits and alternatives for the proposed anesthesia with the patient or authorized representative who has indicated his/her understanding and acceptance.     Dental advisory given  Plan Discussed with: CRNA and Anesthesiologist  Anesthesia Plan Comments: (See PAT note 10/24/2018, Konrad Felix, PA-C)       Anesthesia Quick Evaluation

## 2018-10-25 NOTE — Progress Notes (Signed)
Cardiology Office Note  Date: 10/25/2018   ID: Michael [name], DOB [birth year], MRN [id number]  PCP:  [name], NP  Cardiologist:  Rozann Lesches, MD Electrophysiologist:  None   Chief Complaint  Patient presents with   Cardiac follow-up    History of Present Illness: Michael [name] is a chronically ill 63 y.o. male last assessed via telehealth encounter in April.  He is referred to the office for preoperative cardiac assessment, scheduled to undergo robotic simple prostatectomy and cystoscopy with cystolithectomy by Dr. Alyson Ingles under general anesthesia next Monday.  My understanding is that had he had a preanesthesia visit at [facility] and was found to have significantly elevated blood pressure (no records for review).  He states that he ran out of all of his medications a few days ago.  We have been managing him medically for ischemic heart disease based on previous abnormal Myoview in September 2019.  He had evidence of inferior/septal scar with mild peri-infarct ischemia, LVEF was moderately reduced by that study, but found to be only mildly reduced at 45 to 50% by echocardiogram.  He does not describe any active angina symptoms or nitroglycerin use.  RCRI cardiac risk calculator indicates class IV perioperative risk, 11% chance of major cardiac event.  I personally reviewed his ECG today which shows sinus rhythm with frequent PVCs, old anterior infarct pattern and diffuse nonspecific ST-T changes.  Patient was seen by Dr. Oneida Alar with [facility] in January.Carotid imaging studies were reviewed including CT angiography which demonstrated a 70% heavily calcified RICA stenosis and no significant left-sided disease. He was not felt to be a candidate for carotid stenting and suboptimal candidate for endarterectomy. Plan isformedical therapy at this point.He is on aspirin and Lipitor.  Past Medical History:  Diagnosis Date   Carotid artery disease (HCC)    Chronic  diastolic heart failure (HCC)    CKD (chronic kidney disease) stage 3, GFR 30-59 ml/min (HCC)    COPD (chronic obstructive pulmonary disease) (HCC)    Coronary artery disease    Essential hypertension    Head and neck cancer ([place]) 2019   Right facial basal cell carcinoma s/p resection with right partial mastectomy and partal rhinectomy with skin graft (06/13/17)   Iron deficiency anemia    Urinary retention     Past Surgical History:  Procedure Laterality Date   ANKLE CLOSED REDUCTION Right    open reduction   BASAL CELL CARCINOMA EXCISION  2019   [facility]   COLONOSCOPY N/A 05/31/2017   Procedure: COLONOSCOPY;  Surgeon: Daneil Dolin, MD;  [facility];  Service: Endoscopy;  Laterality: N/A;  2:45pm   POLYPECTOMY  05/31/2017   Procedure: POLYPECTOMY;  Surgeon: Daneil Dolin, MD;  [facility];  Service: Endoscopy;;   TONSILLECTOMY      Current Outpatient Medications  Medication Sig Dispense Refill   aspirin EC 81 MG tablet Take 81 mg by mouth every evening.      atorvastatin (LIPITOR) 40 MG tablet Take 1 tablet (40 mg total) by mouth every evening. 90 tablet 3   furosemide (LASIX) 40 MG tablet Take 1 tablet (40 mg total) by mouth every evening. 90 tablet 3   isosorbide mononitrate (IMDUR) 30 MG 24 hr tablet Take 1 tablet (30 mg total) by mouth every evening. 90 tablet 3   lisinopril (PRINIVIL,ZESTRIL) 5 MG tablet Take 1 tablet (5 mg total) by mouth daily for 30 days. (Patient taking differently: Take 5 mg  by mouth every evening. ) 30 tablet 0   metoprolol succinate (TOPROL-XL) 25 MG 24 hr tablet Take 1 tablet (25 mg total) by mouth every evening. 90 tablet 3   nitroGLYCERIN (NITROSTAT) 0.4 MG SL tablet Place 1 tablet (0.4 mg total) under the tongue every 5 (five) minutes as needed for chest pain. (Patient taking differently: Place 0.4 mg under the tongue every 5 (five) minutes x 3 doses as needed for chest pain. ) 90 tablet 3   vitamin B-12  (CYANOCOBALAMIN) 1000 MCG tablet Take 1,000 mcg by mouth every evening.     No current facility-administered medications for this visit.    Allergies:  Patient has no known allergies.   Social History: The patient  reports that he has been smoking cigarettes. He has a 20.00 pack-year smoking history. He has never used smokeless tobacco. He reports previous alcohol use. He reports that he does not use drugs.   ROS:  Please see the history of present illness. Otherwise, complete review of systems is positive for none.  All other systems are reviewed and negative.   Physical Exam: VS:  BP 132/78    Pulse 79    Temp 97.7 F (36.5 C)    Ht 6' (1.829 m)    Wt 201 lb (91.2 kg)    BMI 27.26 kg/m , BMI Body mass index is 27.26 kg/m.  Wt Readings from Last 3 Encounters:  10/25/18 201 lb (91.2 kg)  10/24/18 195 lb (88.5 kg)  05/25/18 178 lb (80.7 kg)    General: Patient appears comfortable at rest. HEENT: Right facial scar/deformity following previous surgery. Neck: Supple, no elevated JVP or carotid bruits, no thyromegaly. Lungs: Clear to auscultation, nonlabored breathing at rest. Cardiac: Regular rate and rhythm, no S3, soft systolic murmur. Abdomen: Soft, nontender, bowel sounds present. Extremities: No pitting edema, distal pulses 2+. Skin: Warm and dry. Musculoskeletal: No kyphosis. Neuropsychiatric: Alert and oriented x3, affect grossly appropriate.  ECG:  An ECG dated 05/10/2018 was personally reviewed today and demonstrated:  Sinus rhythm with prolonged PR interval and LVH, anterolateral T wave inversions and prolonged QT interval, consider ischemia.  Recent Labwork: 05/10/2018: ALT 19; AST 29 05/12/2018: B Natriuretic Peptide 594.6; Magnesium 2.1 10/24/2018: BUN 19; Creatinine, Ser 1.21; Hemoglobin 8.4; Platelets 462; Potassium 3.7; Sodium 141     Component Value Date/Time   CHOL 194 04/02/2017 0541   TRIG 49 04/02/2017 0541   HDL 42 04/02/2017 0541   CHOLHDL 4.6 04/02/2017 0541     VLDL 10 04/02/2017 0541   LDLCALC 142 (H) 04/02/2017 0541    Other Studies Reviewed Today:  Echocardiogram 12/05/2017: Study Conclusions  - Left ventricle: The cavity size was normal. Wall thickness was increased in a pattern of mild LVH. Systolic function was mildly reduced. The estimated ejection fraction was in the range of 45% to 50%. There is hypokinesis of the basal-midinferolateral myocardium. Features are consistent with a pseudonormal left ventricular filling pattern, with concomitant abnormal relaxation and increased filling pressure (grade 2 diastolic dysfunction). - Aortic valve: Mildly calcified annulus. Trileaflet; mildly calcified leaflets. - Mitral valve: Mildly calcified annulus. There was mild regurgitation. - Left atrium: The atrium was mildly dilated. - Tricuspid valve: There was trivial regurgitation. - Pulmonary arteries: Systolic pressure could not be accurately estimated. - Pericardium, extracardiac: A trivial pericardial effusion was identified anterior to the heart.  Lexiscan Myoview 10/19/2017:  Findings consistent with prior inferior/septal myocardial infarction with mild peri-infarct ischemia.  This is a high risk study. Risk  primarily based on decreased LVEF. There is a prior infarct with only mild current ischemia. Consider correlating LVEF with echocardiogram.  The left ventricular ejection fraction is moderately decreased (33%).  Assessment and Plan:  1.  Preoperative evaluation in a 63 year old male with bilateral carotid artery disease that is felt to be best managed medically following evaluation by Dr. Oneida Alar, essential hypertension with recent reported elevation in blood pressure in the absence of regular medication, tobacco abuse, and ischemic heart disease based on Myoview from September 2019 showing evidence of inferior/septal scar with mild peri-infarct ischemia.  He has mildly reduced LVEF at 45 to 50% by his last  echocardiogram and no recent heart failure symptoms.  Based on substrate and RCRI cardiac risk calculator, he is relatively high risk for surgery under general anesthesia, 11% chance of major cardiac event.  This risk does not preclude surgery however.  Today's blood pressure is reasonable with systolic in the Q000111Q.  We are refilling his cardiac medications.  ECG reviewed and chronically abnormal.  No further cardiac testing is planned at this time.  2.  Moderate, heavily calcified carotid artery disease felt to be best managed medically by Dr. Oneida Alar.  He is on aspirin and statin therapy.  3.  Ongoing tobacco abuse, smoking cessation has been discussed.  4.  Essential hypertension, resuming regular medications.  Medication Adjustments/Labs and Tests Ordered: Current medicines are reviewed at length with the patient today.  Concerns regarding medicines are outlined above.   Tests Ordered: Orders Placed This Encounter  Procedures   EKG 12-Lead    Medication Changes: Meds ordered this encounter  Medications   metoprolol succinate (TOPROL-XL) 25 MG 24 hr tablet    Sig: Take 1 tablet (25 mg total) by mouth every evening.    Dispense:  90 tablet    Refill:  3   atorvastatin (LIPITOR) 40 MG tablet    Sig: Take 1 tablet (40 mg total) by mouth every evening.    Dispense:  90 tablet    Refill:  3   furosemide (LASIX) 40 MG tablet    Sig: Take 1 tablet (40 mg total) by mouth every evening.    Dispense:  90 tablet    Refill:  3   isosorbide mononitrate (IMDUR) 30 MG 24 hr tablet    Sig: Take 1 tablet (30 mg total) by mouth every evening.    Dispense:  90 tablet    Refill:  3    Disposition:  Follow up 6 to 8 weeks in the [place] office.  Signed, Satira Sark, MD, [facility] 10/25/2018 2:04 PM    [facility] [address] [place], [place] [address] Phone: [phone]; Fax: [phone]

## 2018-10-25 NOTE — Patient Instructions (Signed)
Medication Instructions: Your physician recommends that you continue on your current medications as directed. Please refer to the Current Medication list given to you today.   Labwork: None today  Procedures/Testing: None  Follow-Up: 6-8 weeks with Dr.McDowell  Any Additional Special Instructions Will Be Listed Below (If Applicable).     If you need a refill on your cardiac medications before your next appointment, please call your pharmacy.     Thank you for choosing [facility] !

## 2018-10-25 NOTE — Progress Notes (Signed)
Anesthesia Chart Review   Case: [id number] Date/Time: 10/29/18 1200   Procedures:      XI ROBOTIC ASSISTED SIMPLE PROSTATECTOMY (N/A )     CYSTOSCOPY (N/A )   Anesthesia type: General   Pre-op diagnosis: BENIGN PROSTATIC HYPERPLASIA, INCOMPLETE EMPTYING, BLADDER CALCULUS   Location: [facility] ROOM 03 / [facility]   Surgeon: Cleon Gustin, MD      DISCUSSION:63 y.o. current every day smoker (20 pack years) with h/o carotid artery disease (followed by Dr. Oneida Alar), HTN, chronic diastolic heart failure, CAD, COPD, CKD Stage III, BPH, incomplete emptying, bladder calculus scheduled for above procedure 10/29/2018 with Dr. Nicolette Bang.    Elevated BP at PAT visit 10/24/2018.  Seen by Cardiologist, Dr. Rozann Lesches, 10/25/2018.  Per OV note pt had ran out of HTN medications.  Per Dr. Domenic Polite, "Preoperative evaluation in a 62 year old male with bilateral carotid artery disease that is felt to be best managed medically following evaluation by Dr. Oneida Alar, essential hypertension with recent reported elevation in blood pressure in the absence of regular medication, tobacco abuse, and ischemic heart disease based on Myoview from September 2019 showing evidence of inferior/septal scar with mild peri-infarct ischemia.  He has mildly reduced LVEF at 45 to 50% by his last echocardiogram and no recent heart failure symptoms.  Based on substrate and RCRI cardiac risk calculator, he is relatively high risk for surgery under general anesthesia, 11% chance of major cardiac event.  This risk does not preclude surgery however.  Today's blood pressure is reasonable with systolic in the Q000111Q.  We are refilling his cardiac medications.  ECG reviewed and chronically abnormal.  No further cardiac testing is planned at this time."  Anticipate pt can proceed with planned procedure barring acute status change.   VS: BP (!) 209/93   Pulse 85   Temp 36.5 C (Oral)   Resp 18   Ht 6' (1.829 m)   Wt 88.5 kg   SpO2 100%   BMI  26.45 kg/m   PROVIDERS: [name], NP is PCP   Rozann Lesches, MD is Cardiologist  LABS: Labs reviewed: Acceptable for surgery. (all labs ordered are listed, but only abnormal results are displayed)  Labs Reviewed  CBC - Abnormal; Notable for the following components:      Result Value   Hemoglobin 8.4 (*)    HCT 31.8 (*)    MCV 68.4 (*)    MCH 18.1 (*)    MCHC 26.4 (*)    RDW 18.3 (*)    Platelets 462 (*)    All other components within normal limits  BASIC METABOLIC PANEL     IMAGES: VAS US Carotid 01/18/18 Summary: Right Carotid: Velocities in the right ICA are consistent with a 60-79%                stenosis; however, velocities are at the high end of the range.  Left Carotid: Velocities in the left ICA are consistent with a 1-39% stenosis.  Vertebrals:  Bilateral vertebral arteries demonstrate antegrade flow. Subclavians: Normal flow hemodynamics were seen in bilateral subclavian              arteries.  EKG: 10/25/2018 Rate 89 bpm First degree AV block  Frequent ectopic ventricular beats Old anteroseptal infarct Nonspecific T-abnormality   CV: Echo 12/05/2017 Study Conclusions  - Left ventricle: The cavity size was normal. Wall thickness was   increased in a pattern of mild LVH. Systolic function was mildly   reduced. The  estimated ejection fraction was in the range of 45%   to 50%. There is hypokinesis of the basal-midinferolateral   myocardium. Features are consistent with a pseudonormal left   ventricular filling pattern, with concomitant abnormal relaxation   and increased filling pressure (grade 2 diastolic dysfunction). - Aortic valve: Mildly calcified annulus. Trileaflet; mildly   calcified leaflets. - Mitral valve: Mildly calcified annulus. There was mild   regurgitation. - Left atrium: The atrium was mildly dilated. - Tricuspid valve: There was trivial regurgitation. - Pulmonary arteries: Systolic pressure could not be accurately    estimated. - Pericardium, extracardiac: A trivial pericardial effusion was   identified anterior to the heart. Past Medical History:  Diagnosis Date  . Carotid artery disease ([place])   . Chronic diastolic heart failure ([place])   . CKD (chronic kidney disease) stage 3, GFR 30-59 ml/min (HCC)   . COPD (chronic obstructive pulmonary disease) ([place])   . Coronary artery disease   . Essential hypertension   . Head and neck cancer ([place]) 2019   Right facial basal cell carcinoma s/p resection with right partial mastectomy and partal rhinectomy with skin graft (06/13/17)  . Iron deficiency anemia   . Urinary retention     Past Surgical History:  Procedure Laterality Date  . ANKLE CLOSED REDUCTION Right    open reduction  . BASAL CELL CARCINOMA EXCISION  2019   at [facility]  . COLONOSCOPY N/A 05/31/2017   Procedure: COLONOSCOPY;  Surgeon: Daneil Dolin, MD;  [facility];  Service: Endoscopy;  Laterality: N/A;  2:45pm  . POLYPECTOMY  05/31/2017   Procedure: POLYPECTOMY;  Surgeon: Daneil Dolin, MD;  [facility];  Service: Endoscopy;;  . TONSILLECTOMY      MEDICATIONS: . aspirin EC 81 MG tablet  . atorvastatin (LIPITOR) 40 MG tablet  . furosemide (LASIX) 40 MG tablet  . isosorbide mononitrate (IMDUR) 30 MG 24 hr tablet  . lisinopril (PRINIVIL,ZESTRIL) 5 MG tablet  . metoprolol succinate (TOPROL-XL) 25 MG 24 hr tablet  . nitroGLYCERIN (NITROSTAT) 0.4 MG SL tablet  . vitamin B-12 (CYANOCOBALAMIN) 1000 MCG tablet   No current facility-administered medications for this encounter.     Maia Plan [facility] Pre-Surgical Testing [phone] 10/25/18  3:55 PM

## 2018-10-26 NOTE — Progress Notes (Addendum)
Fax entitled Surgical clearance received from [place] from [facility] . Placed on patient chart

## 2018-10-29 ENCOUNTER — Inpatient Hospital Stay (HOSPITAL_COMMUNITY): Payer: Medicaid Other | Admitting: Registered Nurse

## 2018-10-29 ENCOUNTER — Inpatient Hospital Stay (HOSPITAL_COMMUNITY)
Admission: RE | Admit: 2018-10-29 | Discharge: 2018-11-01 | DRG: 717 | Disposition: A | Discharge status: Home / Self Care | Payer: Medicaid Other | Attending: Urology | Admitting: Urology

## 2018-10-29 ENCOUNTER — Inpatient Hospital Stay (HOSPITAL_COMMUNITY): Payer: Medicaid Other | Admitting: Physician Assistant

## 2018-10-29 ENCOUNTER — Other Ambulatory Visit: Payer: Self-pay

## 2018-10-29 ENCOUNTER — Inpatient Hospital Stay (HOSPITAL_COMMUNITY): Payer: Medicaid Other | Admitting: Anesthesiology

## 2018-10-29 ENCOUNTER — Encounter (HOSPITAL_COMMUNITY)
Admission: RE | Disposition: A | Discharge status: Home / Self Care | Payer: Self-pay | Source: Home / Self Care | Attending: Urology

## 2018-10-29 ENCOUNTER — Encounter (HOSPITAL_COMMUNITY): Payer: Self-pay | Admitting: *Deleted

## 2018-10-29 DIAGNOSIS — N99820 Postprocedural hemorrhage and hematoma of a genitourinary system organ or structure following a genitourinary system procedure: Secondary | ICD-10-CM | POA: Diagnosis not present

## 2018-10-29 DIAGNOSIS — N401 Enlarged prostate with lower urinary tract symptoms: Secondary | ICD-10-CM | POA: Diagnosis present

## 2018-10-29 DIAGNOSIS — D62 Acute posthemorrhagic anemia: Secondary | ICD-10-CM | POA: Diagnosis not present

## 2018-10-29 DIAGNOSIS — I2510 Atherosclerotic heart disease of native coronary artery without angina pectoris: Secondary | ICD-10-CM | POA: Diagnosis present

## 2018-10-29 DIAGNOSIS — F17210 Nicotine dependence, cigarettes, uncomplicated: Secondary | ICD-10-CM | POA: Diagnosis present

## 2018-10-29 DIAGNOSIS — N138 Other obstructive and reflux uropathy: Secondary | ICD-10-CM | POA: Diagnosis present

## 2018-10-29 DIAGNOSIS — Z823 Family history of stroke: Secondary | ICD-10-CM | POA: Diagnosis not present

## 2018-10-29 DIAGNOSIS — J449 Chronic obstructive pulmonary disease, unspecified: Secondary | ICD-10-CM | POA: Diagnosis present

## 2018-10-29 DIAGNOSIS — N210 Calculus in bladder: Secondary | ICD-10-CM | POA: Diagnosis present

## 2018-10-29 DIAGNOSIS — Y836 Removal of other organ (partial) (total) as the cause of abnormal reaction of the patient, or of later complication, without mention of misadventure at the time of the procedure: Secondary | ICD-10-CM | POA: Diagnosis not present

## 2018-10-29 DIAGNOSIS — I110 Hypertensive heart disease with heart failure: Secondary | ICD-10-CM | POA: Diagnosis present

## 2018-10-29 DIAGNOSIS — R338 Other retention of urine: Secondary | ICD-10-CM | POA: Diagnosis present

## 2018-10-29 DIAGNOSIS — Z8589 Personal history of malignant neoplasm of other organs and systems: Secondary | ICD-10-CM | POA: Diagnosis not present

## 2018-10-29 DIAGNOSIS — Z85828 Personal history of other malignant neoplasm of skin: Secondary | ICD-10-CM

## 2018-10-29 DIAGNOSIS — I5032 Chronic diastolic (congestive) heart failure: Secondary | ICD-10-CM | POA: Diagnosis present

## 2018-10-29 HISTORY — PX: XI ROBOTIC ASSISTED SIMPLE PROSTATECTOMY: SHX6713

## 2018-10-29 HISTORY — PX: CYSTOSCOPY: SHX5120

## 2018-10-29 LAB — POCT I-STAT, CHEM 8
BUN: 20 mg/dL (ref 8–23)
Calcium, Ion: 1.1 mmol/L — ABNORMAL LOW (ref 1.15–1.40)
Chloride: 107 mmol/L (ref 98–111)
Creatinine, Ser: 1.5 mg/dL — ABNORMAL HIGH (ref 0.61–1.24)
Glucose, Bld: 183 mg/dL — ABNORMAL HIGH (ref 70–99)
HCT: 24 % — ABNORMAL LOW (ref 39.0–52.0)
Hemoglobin: 8.2 g/dL — ABNORMAL LOW (ref 13.0–17.0)
Potassium: 3.6 mmol/L (ref 3.5–5.1)
Sodium: 142 mmol/L (ref 135–145)
TCO2: 22 mmol/L (ref 22–32)

## 2018-10-29 LAB — HEMOGLOBIN AND HEMATOCRIT, BLOOD
HCT: 27.2 % — ABNORMAL LOW (ref 39.0–52.0)
Hemoglobin: 7.4 g/dL — ABNORMAL LOW (ref 13.0–17.0)

## 2018-10-29 LAB — ABO/RH: ABO/RH(D): O POS

## 2018-10-29 SURGERY — CYSTOSCOPY
Anesthesia: General

## 2018-10-29 SURGERY — PROSTATECTOMY, SIMPLE, ROBOT-ASSISTED
Anesthesia: General

## 2018-10-29 MED ORDER — FUROSEMIDE 40 MG PO TABS
40.0000 mg | ORAL_TABLET | Freq: Every evening | ORAL | Status: DC
  Administered 2018-10-30 – 2018-10-31 (×2): 40 mg via ORAL
  Filled 2018-10-29 (×2): qty 1

## 2018-10-29 MED ORDER — LACTATED RINGERS IV SOLN
INTRAVENOUS | Status: DC | PRN
  Administered 2018-10-29: 13:00:00 via INTRAVENOUS

## 2018-10-29 MED ORDER — HYDROCODONE-ACETAMINOPHEN 5-325 MG PO TABS: 1.0000 | ORAL_TABLET | Freq: Four times a day (QID) | ORAL | 0 refills | Status: DC | PRN

## 2018-10-29 MED ORDER — MIDAZOLAM HCL 2 MG/2ML IJ SOLN
INTRAMUSCULAR | Status: AC
  Filled 2018-10-29: qty 2

## 2018-10-29 MED ORDER — ONDANSETRON HCL 4 MG/2ML IJ SOLN: 4.0000 mg | INTRAMUSCULAR | Status: DC | PRN

## 2018-10-29 MED ORDER — NITROGLYCERIN 0.4 MG SL SUBL: 0.4000 mg | SUBLINGUAL_TABLET | SUBLINGUAL | Status: DC | PRN

## 2018-10-29 MED ORDER — ISOSORBIDE MONONITRATE ER 30 MG PO TB24
30.0000 mg | ORAL_TABLET | Freq: Every evening | ORAL | Status: DC
  Administered 2018-10-30 – 2018-10-31 (×2): 30 mg via ORAL
  Filled 2018-10-29 (×2): qty 1

## 2018-10-29 MED ORDER — PROPOFOL 10 MG/ML IV BOLUS
INTRAVENOUS | Status: DC | PRN
  Administered 2018-10-29: 180 mg via INTRAVENOUS

## 2018-10-29 MED ORDER — SODIUM CHLORIDE 0.9 % IV BOLUS
1000.0000 mL | Freq: Once | INTRAVENOUS | Status: AC
  Administered 2018-10-29: 16:00:00 1000 mL via INTRAVENOUS

## 2018-10-29 MED ORDER — CALCIUM CHLORIDE 10 % IV SOLN
INTRAVENOUS | Status: DC | PRN
  Administered 2018-10-29: 200 mg via INTRAVENOUS

## 2018-10-29 MED ORDER — DIPHENHYDRAMINE HCL 12.5 MG/5ML PO ELIX: 12.5000 mg | ORAL_SOLUTION | Freq: Four times a day (QID) | ORAL | Status: DC | PRN

## 2018-10-29 MED ORDER — STERILE WATER FOR IRRIGATION IR SOLN
Status: DC | PRN
  Administered 2018-10-29: 1000 mL

## 2018-10-29 MED ORDER — DEXAMETHASONE SODIUM PHOSPHATE 10 MG/ML IJ SOLN
INTRAMUSCULAR | Status: AC
  Filled 2018-10-29: qty 1

## 2018-10-29 MED ORDER — BUPIVACAINE LIPOSOME 1.3 % IJ SUSP
20.0000 mL | Freq: Once | INTRAMUSCULAR | Status: AC
  Administered 2018-10-29: 16:00:00 20 mL
  Filled 2018-10-29: qty 20

## 2018-10-29 MED ORDER — PHENYLEPHRINE HCL-NACL 10-0.9 MG/250ML-% IV SOLN
INTRAVENOUS | Status: AC
  Filled 2018-10-29: qty 250

## 2018-10-29 MED ORDER — FENTANYL CITRATE (PF) 100 MCG/2ML IJ SOLN
25.0000 ug | INTRAMUSCULAR | Status: DC | PRN
  Administered 2018-10-29 (×4): 50 ug via INTRAVENOUS

## 2018-10-29 MED ORDER — SUGAMMADEX SODIUM 500 MG/5ML IV SOLN
INTRAVENOUS | Status: AC
  Filled 2018-10-29: qty 5

## 2018-10-29 MED ORDER — SODIUM CHLORIDE (PF) 0.9 % IJ SOLN
INTRAMUSCULAR | Status: AC
  Filled 2018-10-29: qty 20

## 2018-10-29 MED ORDER — OXYCODONE HCL 5 MG PO TABS: 5.0000 mg | ORAL_TABLET | Freq: Once | ORAL | Status: DC | PRN

## 2018-10-29 MED ORDER — HYDROMORPHONE HCL 1 MG/ML IJ SOLN: 0.5000 mg | INTRAMUSCULAR | Status: DC | PRN

## 2018-10-29 MED ORDER — LIDOCAINE 2% (20 MG/ML) 5 ML SYRINGE
INTRAMUSCULAR | Status: DC | PRN
  Administered 2018-10-29: 100 mg via INTRAVENOUS

## 2018-10-29 MED ORDER — BELLADONNA ALKALOIDS-OPIUM 16.2-60 MG RE SUPP: 1.0000 | Freq: Four times a day (QID) | RECTAL | Status: DC | PRN

## 2018-10-29 MED ORDER — PROPOFOL 10 MG/ML IV BOLUS
INTRAVENOUS | Status: AC
  Filled 2018-10-29: qty 20

## 2018-10-29 MED ORDER — SODIUM CHLORIDE 0.9 % IR SOLN
3000.0000 mL | Status: DC
  Administered 2018-10-29 – 2018-10-30 (×8): 3000 mL

## 2018-10-29 MED ORDER — FENTANYL CITRATE (PF) 250 MCG/5ML IJ SOLN
INTRAMUSCULAR | Status: AC
  Filled 2018-10-29: qty 5

## 2018-10-29 MED ORDER — FENTANYL CITRATE (PF) 250 MCG/5ML IJ SOLN
INTRAMUSCULAR | Status: DC | PRN
  Administered 2018-10-29 (×3): 50 ug via INTRAVENOUS
  Administered 2018-10-29: 100 ug via INTRAVENOUS
  Administered 2018-10-29: 50 ug via INTRAVENOUS

## 2018-10-29 MED ORDER — DEXTROSE-NACL 5-0.45 % IV SOLN
INTRAVENOUS | Status: DC
  Administered 2018-10-29 – 2018-10-31 (×5): via INTRAVENOUS

## 2018-10-29 MED ORDER — SULFAMETHOXAZOLE-TRIMETHOPRIM 800-160 MG PO TABS: 1.0000 | ORAL_TABLET | Freq: Two times a day (BID) | ORAL | 0 refills | Status: DC

## 2018-10-29 MED ORDER — ROCURONIUM BROMIDE 10 MG/ML (PF) SYRINGE
PREFILLED_SYRINGE | INTRAVENOUS | Status: DC | PRN
  Administered 2018-10-29 (×2): 20 mg via INTRAVENOUS
  Administered 2018-10-29: 60 mg via INTRAVENOUS

## 2018-10-29 MED ORDER — SUCCINYLCHOLINE CHLORIDE 200 MG/10ML IV SOSY
PREFILLED_SYRINGE | INTRAVENOUS | Status: DC | PRN
  Administered 2018-10-29: 100 mg via INTRAVENOUS

## 2018-10-29 MED ORDER — LIDOCAINE 2% (20 MG/ML) 5 ML SYRINGE
INTRAMUSCULAR | Status: DC | PRN
  Administered 2018-10-29: 1.5 mg/kg/h via INTRAVENOUS

## 2018-10-29 MED ORDER — LACTATED RINGERS IR SOLN
Status: DC | PRN
  Administered 2018-10-29: 1

## 2018-10-29 MED ORDER — DOCUSATE SODIUM 100 MG PO CAPS
100.0000 mg | ORAL_CAPSULE | Freq: Two times a day (BID) | ORAL | Status: DC
  Administered 2018-10-29 – 2018-10-31 (×4): 100 mg via ORAL
  Filled 2018-10-29 (×5): qty 1

## 2018-10-29 MED ORDER — FENTANYL CITRATE (PF) 100 MCG/2ML IJ SOLN
INTRAMUSCULAR | Status: AC
  Filled 2018-10-29: qty 2

## 2018-10-29 MED ORDER — SUGAMMADEX SODIUM 200 MG/2ML IV SOLN
INTRAVENOUS | Status: DC | PRN
  Administered 2018-10-29: 200 mg via INTRAVENOUS

## 2018-10-29 MED ORDER — FENTANYL CITRATE (PF) 100 MCG/2ML IJ SOLN
INTRAMUSCULAR | Status: AC
  Administered 2018-10-29: 50 ug via INTRAVENOUS
  Filled 2018-10-29: qty 2

## 2018-10-29 MED ORDER — OXYCODONE HCL 5 MG/5ML PO SOLN: 5.0000 mg | Freq: Once | ORAL | Status: DC | PRN

## 2018-10-29 MED ORDER — LABETALOL HCL 5 MG/ML IV SOLN
INTRAVENOUS | Status: AC
  Administered 2018-10-29: 5 mg
  Filled 2018-10-29: qty 4

## 2018-10-29 MED ORDER — SUCCINYLCHOLINE CHLORIDE 200 MG/10ML IV SOSY
PREFILLED_SYRINGE | INTRAVENOUS | Status: AC
  Filled 2018-10-29: qty 10

## 2018-10-29 MED ORDER — MAGNESIUM CITRATE PO SOLN
1.0000 | Freq: Once | ORAL | Status: DC
  Filled 2018-10-29: qty 296

## 2018-10-29 MED ORDER — ONDANSETRON HCL 4 MG/2ML IJ SOLN
INTRAMUSCULAR | Status: DC | PRN
  Administered 2018-10-29: 4 mg via INTRAVENOUS

## 2018-10-29 MED ORDER — ROCURONIUM BROMIDE 10 MG/ML (PF) SYRINGE
PREFILLED_SYRINGE | INTRAVENOUS | Status: AC
  Filled 2018-10-29: qty 10

## 2018-10-29 MED ORDER — FENTANYL CITRATE (PF) 100 MCG/2ML IJ SOLN: 25.0000 ug | INTRAMUSCULAR | Status: DC | PRN

## 2018-10-29 MED ORDER — SODIUM CHLORIDE 0.9 % IR SOLN
Status: DC | PRN
  Administered 2018-10-29: 9000 mL via INTRAVESICAL

## 2018-10-29 MED ORDER — DEXAMETHASONE SODIUM PHOSPHATE 10 MG/ML IJ SOLN
INTRAMUSCULAR | Status: DC | PRN
  Administered 2018-10-29: 10 mg via INTRAVENOUS

## 2018-10-29 MED ORDER — FENTANYL CITRATE (PF) 100 MCG/2ML IJ SOLN
INTRAMUSCULAR | Status: AC
  Administered 2018-10-29: 18:00:00 50 ug via INTRAVENOUS
  Filled 2018-10-29: qty 2

## 2018-10-29 MED ORDER — SODIUM CHLORIDE 0.9 % IV SOLN
INTRAVENOUS | Status: DC | PRN
  Administered 2018-10-29: 13:00:00 50 ug/min via INTRAVENOUS

## 2018-10-29 MED ORDER — ONDANSETRON HCL 4 MG/2ML IJ SOLN
INTRAMUSCULAR | Status: AC
  Filled 2018-10-29: qty 2

## 2018-10-29 MED ORDER — LIDOCAINE HCL 2 % IJ SOLN
INTRAMUSCULAR | Status: AC
  Filled 2018-10-29: qty 20

## 2018-10-29 MED ORDER — METOPROLOL SUCCINATE ER 25 MG PO TB24
25.0000 mg | ORAL_TABLET | Freq: Every evening | ORAL | Status: DC
  Administered 2018-10-30 – 2018-10-31 (×2): 25 mg via ORAL
  Filled 2018-10-29 (×2): qty 1

## 2018-10-29 MED ORDER — BACITRACIN-NEOMYCIN-POLYMYXIN 400-5-5000 EX OINT: 1.0000 "application " | TOPICAL_OINTMENT | Freq: Three times a day (TID) | CUTANEOUS | Status: DC | PRN

## 2018-10-29 MED ORDER — ONDANSETRON HCL 4 MG/2ML IJ SOLN: 4.0000 mg | Freq: Once | INTRAMUSCULAR | Status: DC | PRN

## 2018-10-29 MED ORDER — SODIUM CHLORIDE (PF) 0.9 % IJ SOLN
INTRAMUSCULAR | Status: DC | PRN
  Administered 2018-10-29: 20 mL

## 2018-10-29 MED ORDER — MIDAZOLAM HCL 5 MG/5ML IJ SOLN
INTRAMUSCULAR | Status: DC | PRN
  Administered 2018-10-29: 2 mg via INTRAVENOUS

## 2018-10-29 MED ORDER — OXYCODONE HCL 5 MG PO TABS
5.0000 mg | ORAL_TABLET | ORAL | Status: DC | PRN
  Administered 2018-10-30 – 2018-10-31 (×2): 5 mg via ORAL
  Filled 2018-10-29 (×2): qty 1

## 2018-10-29 MED ORDER — CEFAZOLIN SODIUM-DEXTROSE 2-4 GM/100ML-% IV SOLN
2.0000 g | INTRAVENOUS | Status: AC
  Administered 2018-10-29: 12:00:00 2 g via INTRAVENOUS
  Filled 2018-10-29: qty 100

## 2018-10-29 MED ORDER — SODIUM CHLORIDE 0.9 % IV SOLN
INTRAVENOUS | Status: DC | PRN
  Administered 2018-10-29: 19:00:00 50 ug/min via INTRAVENOUS

## 2018-10-29 MED ORDER — LISINOPRIL 5 MG PO TABS
5.0000 mg | ORAL_TABLET | Freq: Every evening | ORAL | Status: DC
  Administered 2018-10-30 – 2018-10-31 (×2): 5 mg via ORAL
  Filled 2018-10-29 (×3): qty 1

## 2018-10-29 MED ORDER — LIDOCAINE 2% (20 MG/ML) 5 ML SYRINGE
INTRAMUSCULAR | Status: AC
  Filled 2018-10-29: qty 5

## 2018-10-29 MED ORDER — ATORVASTATIN CALCIUM 40 MG PO TABS
40.0000 mg | ORAL_TABLET | Freq: Every evening | ORAL | Status: DC
  Administered 2018-10-30 – 2018-10-31 (×2): 40 mg via ORAL
  Filled 2018-10-29 (×2): qty 1

## 2018-10-29 MED ORDER — ACETAMINOPHEN 10 MG/ML IV SOLN
1000.0000 mg | Freq: Four times a day (QID) | INTRAVENOUS | Status: AC
  Administered 2018-10-29 – 2018-10-30 (×4): 1000 mg via INTRAVENOUS
  Filled 2018-10-29 (×4): qty 100

## 2018-10-29 MED ORDER — PROPOFOL 10 MG/ML IV BOLUS
INTRAVENOUS | Status: DC | PRN
  Administered 2018-10-29: 60 mg via INTRAVENOUS

## 2018-10-29 MED ORDER — LACTATED RINGERS IV SOLN
INTRAVENOUS | Status: DC
  Administered 2018-10-29: 1 mL via INTRAVENOUS
  Administered 2018-10-29: 10:00:00 via INTRAVENOUS

## 2018-10-29 MED ORDER — DIPHENHYDRAMINE HCL 50 MG/ML IJ SOLN: 12.5000 mg | Freq: Four times a day (QID) | INTRAMUSCULAR | Status: DC | PRN

## 2018-10-29 SURGICAL SUPPLY — 61 items
APPLICATOR COTTON TIP 6 STRL (MISCELLANEOUS) ×1 IMPLANT
APPLICATOR COTTON TIP 6IN STRL (MISCELLANEOUS) ×3
CATH FOLEY 2WAY SLVR  5CC 18FR (CATHETERS) ×2
CATH FOLEY 2WAY SLVR 5CC 18FR (CATHETERS) ×1 IMPLANT
CATH FOLEY 3WAY 30CC 22FR (CATHETERS) ×3 IMPLANT
CHLORAPREP W/TINT 26 (MISCELLANEOUS) ×3 IMPLANT
CLOTH BEACON ORANGE TIMEOUT ST (SAFETY) ×3 IMPLANT
COVER SURGICAL LIGHT HANDLE (MISCELLANEOUS) ×3 IMPLANT
COVER TIP SHEARS 8 DVNC (MISCELLANEOUS) ×1 IMPLANT
COVER TIP SHEARS 8MM DA VINCI (MISCELLANEOUS) ×2
COVER WAND RF STERILE (DRAPES) IMPLANT
DECANTER SPIKE VIAL GLASS SM (MISCELLANEOUS) ×3 IMPLANT
DERMABOND ADVANCED (GAUZE/BANDAGES/DRESSINGS) ×2
DERMABOND ADVANCED .7 DNX12 (GAUZE/BANDAGES/DRESSINGS) ×1 IMPLANT
DRAPE ARM DVNC X/XI (DISPOSABLE) ×4 IMPLANT
DRAPE COLUMN DVNC XI (DISPOSABLE) ×1 IMPLANT
DRAPE DA VINCI XI ARM (DISPOSABLE) ×8
DRAPE DA VINCI XI COLUMN (DISPOSABLE) ×2
DRAPE SURG IRRIG POUCH 19X23 (DRAPES) ×3 IMPLANT
DRSG TEGADERM 4X4.75 (GAUZE/BANDAGES/DRESSINGS) ×3 IMPLANT
ELECT PENCIL ROCKER SW 15FT (MISCELLANEOUS) ×3 IMPLANT
ELECT REM PT RETURN 15FT ADLT (MISCELLANEOUS) ×3 IMPLANT
GAUZE SPONGE 2X2 8PLY STRL LF (GAUZE/BANDAGES/DRESSINGS) ×1 IMPLANT
GLOVE BIO SURGEON STRL SZ 6.5 (GLOVE) ×2 IMPLANT
GLOVE BIO SURGEON STRL SZ8 (GLOVE) ×6 IMPLANT
GLOVE BIO SURGEONS STRL SZ 6.5 (GLOVE) ×1
GLOVE BIOGEL PI IND STRL 8 (GLOVE) ×2 IMPLANT
GLOVE BIOGEL PI INDICATOR 8 (GLOVE) ×4
GOWN STRL REUS W/TWL LRG LVL3 (GOWN DISPOSABLE) ×9 IMPLANT
HOLDER FOLEY CATH W/STRAP (MISCELLANEOUS) ×3 IMPLANT
IRRIG SUCT STRYKERFLOW 2 WTIP (MISCELLANEOUS) ×3
IRRIGATION SUCT STRKRFLW 2 WTP (MISCELLANEOUS) ×1 IMPLANT
IV LACTATED RINGERS 1000ML (IV SOLUTION) ×3 IMPLANT
KIT TURNOVER KIT A (KITS) IMPLANT
NEEDLE INSUFFLATION 14GA 120MM (NEEDLE) ×3 IMPLANT
PACK ROBOT UROLOGY CUSTOM (CUSTOM PROCEDURE TRAY) ×3 IMPLANT
PAD POSITIONING PINK XL (MISCELLANEOUS) ×3 IMPLANT
SEAL CANN UNIV 5-8 DVNC XI (MISCELLANEOUS) ×4 IMPLANT
SEAL XI 5MM-8MM UNIVERSAL (MISCELLANEOUS) ×8
SET IRRIG Y TYPE TUR BLADDER L (SET/KITS/TRAYS/PACK) IMPLANT
SOLUTION ELECTROLUBE (MISCELLANEOUS) ×3 IMPLANT
SPONGE GAUZE 2X2 STER 10/PKG (GAUZE/BANDAGES/DRESSINGS) ×2
SPONGE LAP 4X18 RFD (DISPOSABLE) IMPLANT
SUT ETHILON 3 0 PS 1 (SUTURE) ×3 IMPLANT
SUT MNCRL AB 4-0 PS2 18 (SUTURE) ×6 IMPLANT
SUT PDS AB 1 CT1 27 (SUTURE) ×3 IMPLANT
SUT PDS AB 1 TP1 54 (SUTURE) ×3 IMPLANT
SUT V-LOC BARB 180 2/0GR6 GS22 (SUTURE) ×6
SUT VIC AB 0 CT1 27 (SUTURE) ×14
SUT VIC AB 0 CT1 27XBRD ANTBC (SUTURE) ×7 IMPLANT
SUT VIC AB 2-0 CT1 27 (SUTURE) ×2
SUT VIC AB 2-0 CT1 27XBRD (SUTURE) ×1 IMPLANT
SUT VIC AB 2-0 SH 27 (SUTURE)
SUT VIC AB 2-0 SH 27X BRD (SUTURE) IMPLANT
SUT VIC AB 2-0 UR6 27 (SUTURE) ×6 IMPLANT
SUT VICRYL 0 UR6 27IN ABS (SUTURE) ×3 IMPLANT
SUT VLOC BARB 180 ABS3/0GR12 (SUTURE)
SUTURE V-LC BRB 180 2/0GR6GS22 (SUTURE) ×2 IMPLANT
SUTURE VLOC BRB 180 ABS3/0GR12 (SUTURE) IMPLANT
TOWEL OR NON WOVEN STRL DISP B (DISPOSABLE) ×3 IMPLANT
WATER STERILE IRR 1000ML POUR (IV SOLUTION) ×3 IMPLANT

## 2018-10-29 SURGICAL SUPPLY — 16 items
BAG URINE DRAINAGE (UROLOGICAL SUPPLIES) ×3 IMPLANT
BAG URO CATCHER STRL LF (MISCELLANEOUS) ×3 IMPLANT
CATH HEMA 3WAY 30CC 24FR COUDE (CATHETERS) ×3 IMPLANT
CATH URET 5FR 28IN OPEN ENDED (CATHETERS) IMPLANT
CLOTH BEACON ORANGE TIMEOUT ST (SAFETY) ×3 IMPLANT
GLOVE BIOGEL M STRL SZ7.5 (GLOVE) ×3 IMPLANT
GOWN STRL REUS W/TWL LRG LVL3 (GOWN DISPOSABLE) ×6 IMPLANT
GUIDEWIRE STR DUAL SENSOR (WIRE) IMPLANT
GUIDEWIRE ZIPWRE .038 STRAIGHT (WIRE) IMPLANT
KIT TURNOVER KIT A (KITS) ×3 IMPLANT
LOOP CUT BIPOLAR 24F LRG (ELECTROSURGICAL) ×3 IMPLANT
MANIFOLD NEPTUNE II (INSTRUMENTS) ×3 IMPLANT
PACK CYSTO (CUSTOM PROCEDURE TRAY) ×3 IMPLANT
TUBING CONNECTING 10 (TUBING) ×2 IMPLANT
TUBING CONNECTING 10' (TUBING) ×1
TUBING UROLOGY SET (TUBING) ×3 IMPLANT

## 2018-10-29 NOTE — H&P (Signed)
Urology Admission H&P  Chief Complaint: Urinary retention  History of Present Illness: Michael [name] is a 63yo with a hx of BPH who was found to have urinary retention over 3 months ago. He also had an elevated PSA and underwent prostate biopsy which showed normal prostate tissue. Prostate on [place] was 187g. He has severe urinary frequency, urgency and occasional gross hematuria. He has a 3cm bladder calculus  Past Medical History:  Diagnosis Date  . Carotid artery disease ([place])   . Chronic diastolic heart failure ([place])   . CKD (chronic kidney disease) stage 3, GFR 30-59 ml/min (HCC)   . COPD (chronic obstructive pulmonary disease) ([place])   . Coronary artery disease   . Essential hypertension   . Head and neck cancer ([place]) 2019   Right facial basal cell carcinoma s/p resection with right partial mastectomy and partal rhinectomy with skin graft (06/13/17)  . Iron deficiency anemia   . Urinary retention    Past Surgical History:  Procedure Laterality Date  . ANKLE CLOSED REDUCTION Right    open reduction  . BASAL CELL CARCINOMA EXCISION  2019   at [facility]  . COLONOSCOPY N/A 05/31/2017   Procedure: COLONOSCOPY;  Surgeon: Daneil Dolin, MD;  [facility];  Service: Endoscopy;  Laterality: N/A;  2:45pm  . POLYPECTOMY  05/31/2017   Procedure: POLYPECTOMY;  Surgeon: Daneil Dolin, MD;  [facility];  Service: Endoscopy;;  . TONSILLECTOMY      Home Medications:  Current Facility-Administered Medications  Medication Dose Route Frequency Provider Last Rate Last Dose  . bupivacaine liposome (EXPAREL) 1.3 % injection 266 mg  20 mL Infiltration Once Cleon Gustin, MD      . ceFAZolin (ANCEF) IVPB 2g/100 mL premix  2 g Intravenous 30 min Pre-Op Federick Levene, Candee Furbish, MD      . lactated ringers infusion   Intravenous Continuous Effie Berkshire, MD 100 mL/hr at 10/29/18 1017    . lactated ringers irrigation solution    PRN Alyson Ingles Candee Furbish, MD   1 application at [phone]   . magnesium citrate solution 1 Bottle  1 Bottle Oral Once Latricia Cerrito, Candee Furbish, MD      . phenylephrine (NEOSYNEPHRINE) 10-0.9 MG/250ML-% infusion           . sterile water for irrigation for irrigation    PRN Alyson Ingles Candee Furbish, MD   1,000 mL at 10/29/18 1150   Allergies: No Known Allergies  Family History  Problem Relation Age of Onset  . Stroke Father   . Cirrhosis Mother   . Colon cancer Neg Hx    Social History:  reports that he has been smoking cigarettes. He has a 20.00 pack-year smoking history. He has never used smokeless tobacco. He reports previous alcohol use. He reports that he does not use drugs.  Review of Systems  Genitourinary: Positive for dysuria, frequency, hematuria and urgency.  All other systems reviewed and are negative.   Physical Exam:  Vital signs in last 24 hours: Temp:  [97.8 F (36.6 C)] 97.8 F (36.6 C) (09/28 0948) Pulse Rate:  [83] 83 (09/28 0948) Resp:  [20] 20 (09/28 0948) BP: (168)/(82) 168/82 (09/28 0948) SpO2:  [100 %] 100 % (09/28 0948) Physical Exam  Constitutional: He is oriented to person, place, and time. He appears well-developed and well-nourished.  HENT:  Head: Normocephalic and atraumatic.  Eyes: Pupils are equal, round, and reactive to light. EOM are normal.  Neck: Normal range of  motion. No thyromegaly present.  Cardiovascular: Normal rate and regular rhythm.  Respiratory: Effort normal. No respiratory distress.  GI: Soft. He exhibits no distension.  Musculoskeletal: Normal range of motion.        General: No edema.  Neurological: He is alert and oriented to person, place, and time.  Skin: Skin is warm and dry.  Psychiatric: He has a normal mood and affect. His behavior is normal. Judgment and thought content normal.    Laboratory Data:  Results for orders placed or performed during the hospital encounter of 10/29/18 (from the past 24 hour(s))  Type and screen [place]     Status: None   Collection  Time: 10/29/18 10:00 AM  Result Value Ref Range   ABO/RH(D) O POS    Antibody Screen NEG    Sample Expiration      11/01/2018,2359 Performed at [facility], [place] [address] [place], [place] [address]   ABO/Rh     Status: None (Preliminary result)   Collection Time: 10/29/18 10:30 AM  Result Value Ref Range   ABO/RH(D)      O POS Performed at [facility], [place] [address], [place], [place] [address]    Recent Results (from the past 240 hour(s))  SARS CORONAVIRUS 2 (TAT 6-24 HRS)     Status: None   Collection Time: 10/25/18 11:00 AM  Result Value Ref Range Status   SARS Coronavirus 2 NEGATIVE NEGATIVE Final    Comment: (NOTE) SARS-CoV-2 target nucleic acids are NOT DETECTED. The SARS-CoV-2 RNA is generally detectable in upper and lower respiratory specimens during the acute phase of infection. Negative results do not preclude SARS-CoV-2 infection, do not rule out co-infections with other pathogens, and should not be used as the sole basis for treatment or other patient management decisions. Negative results must be combined with clinical observations, patient history, and epidemiological information. The expected result is Negative. Fact Sheet for Patients: SugarRoll.be Fact Sheet for Healthcare Providers: https://www.woods-mathews.com/ This test is not yet approved or cleared by the [place] FDA and  has been authorized for detection and/or diagnosis of SARS-CoV-2 by FDA under an Emergency Use Authorization (EUA). This EUA will remain  in effect (meaning this test can be used) for the duration of the COVID-19 declaration under Section 56 4(b)(1) of the Act, 21 U.S.C. section 360bbb-3(b)(1), unless the authorization is terminated or revoked sooner. Performed at [facility], [place] [address], [place], [place] [address]    Creatinine: Recent Labs    10/24/18 1333  CREATININE 1.21    Baseline Creatinine: 1.2  Impression/Assessment:  63yo with BPh with LUTS, urinary retention and bladder calculus  Plan:  The risks/benefits/alternatives to robotic simple prostatectomy and cystolithotomy was explained to the patient and he understands and wishes to proceed with surgery  Nicolette Bang 10/29/2018, 12:09 PM

## 2018-10-29 NOTE — Op Note (Signed)
.  Preoperative diagnosis: clot retention  Postoperative diagnosis: Same  Procedure: 1 cystoscopy 2.  Clot evacuation  Attending: Rosie Fate  Anesthesia: General  Estimated blood loss: Minimal  Drains: 24 French foley  Specimens: none  Antibiotics: ancef  Findings: 200cc of clot evacuated from the bladder. Cystotomy intact. No areas of active bleeding  Indications: Patient is a 63 year old male who underwent simple prostatectomy today. His foley stopped draining and multiple attempts  Were made to irrigate the foley and repalce the foley which were unsuccessful.  After discussing treatment options, they decided proceed with clot evacuation.  Procedure her in detail: The patient was brought to the operating room and a brief timeout was done to ensure correct patient, correct procedure, correct site.  General anesthesia was administered patient was placed in dorsal lithotomy position.  Their genitalia was then prepped and draped in usual sterile fashion.  A rigid 69 French cystoscope was passed in the urethra and the bladder.  Bladder was inspected and we noted a large amount of clot. the ureteral orifices were in the normal orthotopic locations. We then removed the cystoscope and placed a resectoscope into the bladder. We proceeded to remove the large clot burden from the bladder. We then re-inspected the bladder and found no residual bleeding.  the bladder was then drained, a 24 French foley was placed and this concluded the procedure which was well tolerated by patient.  Complications: None  Condition: Stable, extubated, transferred to PACU  Plan: Patient is admitted overnight with continuous bladder irrigation.

## 2018-10-29 NOTE — Progress Notes (Signed)
I was called to bedside after the patient's Foley catheter completely clotted off following robotic simple prostatectomy this afternoon.  I attempted to exchange his Foley catheter with a 24 French three-way hematuria and despite extensive hand irrigation, was unable to retrieve any significant amount of intravesical clots.  The patient has been consented for cystoscopy with clot evacuation and fulguration.  Michael Hughs, MD

## 2018-10-29 NOTE — Progress Notes (Signed)
CBI stopped flowing again. Tubing clotted at the tip of the penis. Attempted to irrigate, small clots extracted, CBI fails to flow. Notified Dr. Alyson Ingles, resident, PA as well as urologist on call.

## 2018-10-29 NOTE — Discharge Instructions (Signed)
1. Activity:  You are encouraged to ambulate frequently (about every hour during waking hours) to help prevent blood clots from forming in your legs or lungs.  However, you should not engage in any heavy lifting (> 10-15 lbs), strenuous activity, or straining. 2. Diet: You should continue a clear liquid diet until passing gas from below.  Once this occurs, you may advance your diet to a soft diet that would be easy to digest (i.e soups, scrambled eggs, mashed potatoes, etc.) for 24 hours just as you would if getting over a bad stomach flu.  If tolerating this diet well for 24 hours, you may then begin eating regular food.  It will be normal to have some amount of bloating, nausea, and abdominal discomfort intermittently. 3. Prescriptions:  You will be provided a prescription for pain medication to take as needed.  If your pain is not severe enough to require the prescription pain medication, you may take Tylenol instead.  You should also take an over the counter stool softener (Colace 100 mg twice daily) to avoid straining with bowel movements as the pain medication may constipate you. Finally, you will also be provided a prescription for an antibiotic to begin the day prior to your return visit in the office for catheter removal. 4. Catheter care: You will be taught how to take care of the catheter by the nursing staff prior to discharge from the hospital.  You may use both a leg bag and the larger bedside bag but it is recommended to at least use the bigger bedside bag at nighttime as the leg bag is small and will fill up overnight and also does not drain as well when lying flat. You may periodically feel a strong urge to void with the catheter in place.  This is a bladder spasm and most often can occur when having a bowel movement or when you are moving around. It is typically self-limited and usually will stop after a few minutes.  You may use some Vaseline or Neosporin around the tip of the catheter to  reduce friction at the tip of the penis. 5. Incisions: You may remove your dressing bandages the 2nd day after surgery.  You most likely will have a few small staples in each of the incisions and once the bandages are removed, the incisions may stay open to air.  You may start showering (not soaking or bathing in water) 48 hours after surgery and the incisions simply need to be patted dry after the shower.  No additional care is needed. 6. What to call us about: You should call the office ([phone]) if you develop fever > 101, persistent vomiting, or the catheter stops draining. Also, feel free to call with any other questions you may have and remember the handout that was provided to you as a reference preoperatively which answers many of the common questions that arise after surgery. 7. You may resume aspirin, advil, aleve, vitamins, and supplements 7 days after surgery.  

## 2018-10-29 NOTE — Transfer of Care (Signed)
Immediate Anesthesia Transfer of Care Note  Patient: Michael [name]  Procedure(s) Performed: CYSTOSCOPY, CLOT EVACUATION (N/A )  Patient Location: PACU  Anesthesia Type:General  Level of Consciousness: awake, alert  and oriented  Airway & Oxygen Therapy: Patient Spontanous Breathing and Patient connected to face mask oxygen  Post-op Assessment: Report given to RN and Post -op Vital signs reviewed and stable  Post vital signs: Reviewed and stable  Last Vitals:  Vitals Value Taken Time  BP 147/83 10/29/18 1908  Temp    Pulse 86 10/29/18 1909  Resp 19 10/29/18 1909  SpO2 100 % 10/29/18 1909  Vitals shown include unvalidated device data.  Last Pain:  Vitals:   10/29/18 1800  TempSrc:   PainSc: 8          Complications: No apparent anesthesia complications

## 2018-10-29 NOTE — Anesthesia Preprocedure Evaluation (Addendum)
Anesthesia Evaluation  Patient identified by MRN, date of birth, ID band Patient awake    Reviewed: Allergy & Precautions, NPO status , Patient's Chart, lab work & pertinent test results  Airway Mallampati: II  TM Distance: >3 FB   Mouth opening: Limited Mouth Opening  Dental  (+) Edentulous Upper, Dental Advisory Given   Pulmonary COPD, Current Smoker,     + decreased breath sounds      Cardiovascular hypertension, Pt. on medications and Pt. on home beta blockers + CAD and +CHF  Normal cardiovascular exam     Neuro/Psych negative neurological ROS  negative psych ROS   GI/Hepatic negative GI ROS, Neg liver ROS,   Endo/Other  negative endocrine ROS  Renal/GU Renal disease     Musculoskeletal   Abdominal Normal abdominal exam  (+)   Peds  Hematology   Anesthesia Other Findings   Reproductive/Obstetrics                           Anesthesia Physical Anesthesia Plan  ASA: III and emergent  Anesthesia Plan: General   Post-op Pain Management:    Induction: Intravenous  PONV Risk Score and Plan: 2 and Ondansetron, Treatment may vary due to age or medical condition and Midazolam  Airway Management Planned: LMA  Additional Equipment: None  Intra-op Plan:   Post-operative Plan: Extubation in OR  Informed Consent: I have reviewed the patients History and Physical, chart, labs and discussed the procedure including the risks, benefits and alternatives for the proposed anesthesia with the patient or authorized representative who has indicated his/her understanding and acceptance.     Dental advisory given  Plan Discussed with: CRNA  Anesthesia Plan Comments: (Administer 2U RBC's in OR. )       Anesthesia Quick Evaluation

## 2018-10-29 NOTE — Progress Notes (Signed)
CBI stopped flowing. Checked tubing for any kinks. Foley catheter not kinked, but thick bloody drainage noted in tubing. Charge RN attempted to irrigate foley, changed bag, bloody clots removed. CBI continued at a moderate rate.

## 2018-10-29 NOTE — Anesthesia Procedure Notes (Signed)
Procedure Name: Intubation Date/Time: 10/29/2018 12:30 PM Performed by: Talbot Grumbling, CRNA Pre-anesthesia Checklist: Patient identified, Emergency Drugs available, Suction available and Patient being monitored Patient Re-evaluated:Patient Re-evaluated prior to induction Oxygen Delivery Method: Circle system utilized Preoxygenation: Pre-oxygenation with 100% oxygen Induction Type: IV induction Ventilation: Mask ventilation without difficulty Laryngoscope Size: Glidescope Grade View: Grade I Tube type: Oral Tube size: 7.5 mm Number of attempts: 1 Airway Equipment and Method: Stylet Placement Confirmation: ETT inserted through vocal cords under direct vision,  positive ETCO2 and breath sounds checked- equal and bilateral Secured at: 23 cm Tube secured with: Tape Dental Injury: Teeth and Oropharynx as per pre-operative assessment

## 2018-10-29 NOTE — Anesthesia Procedure Notes (Signed)
Procedure Name: LMA Insertion Date/Time: 10/29/2018 6:26 PM Performed by: West Pugh, CRNA Pre-anesthesia Checklist: Patient identified, Emergency Drugs available, Suction available, Patient being monitored and Timeout performed Patient Re-evaluated:Patient Re-evaluated prior to induction Oxygen Delivery Method: Circle system utilized Preoxygenation: Pre-oxygenation with 100% oxygen Induction Type: IV induction LMA: LMA with gastric port inserted LMA Size: 4.0 Number of attempts: 1 Placement Confirmation: positive ETCO2 Tube secured with: Tape Dental Injury: Teeth and Oropharynx as per pre-operative assessment

## 2018-10-29 NOTE — Anesthesia Procedure Notes (Signed)
Arterial Line Insertion Start/End9/28/2020 12:32 PM, 10/29/2018 12:36 PM Performed by: Catalina Gravel, MD, anesthesiologist  Patient location: OR. Preanesthetic checklist: patient identified, IV checked, site marked, risks and benefits discussed, surgical consent, monitors and equipment checked, pre-op evaluation, timeout performed and anesthesia consent Lidocaine 1% used for infiltration Right, radial was placed Catheter size: 20 Fr Hand hygiene performed , maximum sterile barriers used  and Seldinger technique used  Attempts: 1 Procedure performed without using ultrasound guided technique. Following insertion, dressing applied and Biopatch. Post procedure assessment: normal and unchanged  Patient tolerated the procedure well with no immediate complications. Additional procedure comments: Attempt x1 by CRNA, attempt x1 by MDA.Marland Kitchen

## 2018-10-29 NOTE — Anesthesia Postprocedure Evaluation (Signed)
Anesthesia Post Note  Patient: Michael [name]  Procedure(s) Performed: XI ROBOTIC ASSISTED SIMPLE PROSTATECTOMY (N/A )     Patient location during evaluation: PACU Anesthesia Type: General Pain management: pain level controlled Vital Signs Assessment: post-procedure vital signs reviewed and stable Respiratory status: spontaneous breathing, nonlabored ventilation, respiratory function stable and patient connected to nasal cannula oxygen Cardiovascular status: blood pressure returned to baseline and stable Postop Assessment: no apparent nausea or vomiting Anesthetic complications: no Comments: Return to OR for clotted catheter.     Last Vitals:  Vitals:   10/29/18 1915 10/29/18 1919  BP: (!) 157/89 (!) 153/94  Pulse: 86 85  Resp: 18 19  Temp:    SpO2: 100% 100%    Last Pain:  Vitals:   10/29/18 1800  TempSrc:   PainSc: [place]

## 2018-10-29 NOTE — Anesthesia Postprocedure Evaluation (Signed)
Anesthesia Post Note  Patient: Biff Charon  Procedure(s) Performed: CYSTOSCOPY, CLOT EVACUATION (N/A )     Patient location during evaluation: PACU Anesthesia Type: General Level of consciousness: awake and alert Pain management: pain level controlled Vital Signs Assessment: post-procedure vital signs reviewed and stable Respiratory status: spontaneous breathing, nonlabored ventilation, respiratory function stable and patient connected to nasal cannula oxygen Cardiovascular status: blood pressure returned to baseline and stable Postop Assessment: no apparent nausea or vomiting Anesthetic complications: no    Last Vitals:  Vitals:   10/29/18 1936 10/29/18 2006  BP: (!) 157/84 (!) 159/84  Pulse: 82 86  Resp: 18 16  Temp:  36.5 C  SpO2: 97% 99%    Last Pain:  Vitals:   10/29/18 2006  TempSrc: Oral  PainSc:                  Effie Berkshire

## 2018-10-29 NOTE — Op Note (Signed)
PREOPERATIVE DIAGNOSIS: BPH with urinary retention, bladder calculus  POSTOPERATIVE DIAGNOSIS: Same  PROCEDURES: 1. Robotic-assisted laparoscopic simple prostatectomy. 2. Cystolithotomy for a stone greater than 2.5cm  ANESTHESIA: General  ATTENDING: Nicolette Bang, MD  RESIDENT: Kerrie Pleasure, MD  ASSISTANT: Clemetine Marker, PA  ESTIMATED BLOOD LOSS: 150 mL.  COMPLICATIONS: None.  SPECIMEN: 1.prostatic adenoma 2. Bladder calculus  ANTIBIOTICS: ancef  FINDINGS: 3cm intravesical prostatic protrusion. Ureteral orifices in normal anatomic location. No leaks from cystotomy at 150cc of water. The assistant was utilized for retraction, suction, passing suture and retrieving the calculus.   DRAINS: 1. Jackson-Pratt drain to bulb suction. 2. Foley catheter to straight drain.  INDICATION: Michael [name] is a very pleasant 63 year old gentleman, who has BPH with significant LUTS including elevated PVR with bladder calculus. His TRUS volume is 187cc.  Options were discussed with the patient in detail for primary manage including continued surveillance protocols versus surgical extirpation with and without minimally invasive assistance and he wished to proceed with robotic simple prostatectomy. Informed consent was obtained and placed in the medical record.  PROCEDURE IN DETAIL: The patient was brought to the operating room and a breif timeout was down to ensure correct patient, correct procedure, and correct site. Intravenous antibiotics were administered. General endotracheal anesthesia was introduced. The patient was placed into a low lithotomy position after tucking his arms with foam padding, placing on a pink and non-slide foam pad. A test of steep Trendelenburg positioning was performed and he was found to be suitably positioned. Sterile field was created by prepping and draping the patient's penis, perineum and proximal thighs using iodine and his infra-xiphoid  abdomen using chlorhexidine gluconate. Next, a high-flow, low-pressure pneumoperitoneum was obtained using Veress technique in the infraumbilical midline having passed the aspiration and drop test. Next, a 8-mm robotic camera port was placed in the same location. Laparoscopic examination of the peritoneal cavity revealed no significant adhesions and no visceral injury. Additional ports were placed as follows: Right paramedian 8-mm robotic port, right far lateral 12-mm assistant port, left paramedian 8-mm robotic port, left far lateral 8-mm robotic port, and right paramedian 5-mm suction port. Robot was docked and passed through the electronic checks. Next, attention was directed for the development of space of Retzius. Incision was made lateral to the left medial umbilical ligament from the midline towards the area of the internal ring and coursing along the iliac vessels towards the area of the ureter, which was positively identified. The left bladder was dissected away from the pelvic sidewall towards the area of the endopelvic fascia. A mirror-imaged dissection was performed on the right side.  Additional anterior attachments were taken down using cautery scissors. Next, the bladder neck was identified moving the Foley catheter back and forth. We then made a 6cm transverse cystotomy 3cm from the bladder neck. We identified the ureteral orifices and care was taken to exclude then from the dissection. We then placed 2 holding stitches in the anterior bladder wall and secured it to Coopers ligament. We then removed the bladder calculus.  We then made a circumscribing incision around the base of the prostate. We then used a 0 vicryl in a figure of eight fashion in the base of the adenoma for traction. We proceeded with a posterior dissection of the prostate until we identified the capsule. We then used a combination of electrocautery, blunt and sharp dissection to free the adenoma from the  capsule. We then dissected laterally to the apex. Individual bleeders were cauterized. We then dissected  anteriorly along the capsule until we reached the apex. The adenoma was then freed and placed in an endocatch bag. We noted good hemostasis and no additional sutures were placed. A Foley catheter was then placed per urethra easily. We then tacked down the bladder neck to the prostatic fossa with a single interrupted 2-0 vicryl. We then proceeded to closed the cystotomy. We closed the bladder with a running 0 vicryl full thickness. We then performed a imbricating second layer  Closure with 0 vicryl. The bladder was then filled with 150cc of water and we noted no leak.  All sponge and needle counts were correct. A closed suction drain was brought to the previous left lateral robotic port site into the area of the peritoneal cavity. The previous right 12-mm assistant port was closed at the level of the fascia using a Carter-Thomason suture passer under laparoscopic vision. Robot was undocked. Specimen was retrieved by extending the previous camera port site inferiorly for distance approximately 3 cm and removing the prostatectomy specimens and setting aside for permanent pathology. The site was closed at the level of fascia using figure-of-eight 0 vicryl followed by reapproximation of the Scarpa's using running Vicryl. All incision sites were infiltrated with dilute Lyophilized Marcaine and closed at the level of the skin using subcuticular Monocryl followed by Dermabond. Procedure was then terminated. The patient tolerated the procedure well. There were no immediate periprocedural complications and the patient was taken to the postanesthesia care unit in stable Condition.  COMPLICATIONS: None  CONDITION: Stable, extubated, transferred to PACU  PLAN: The patient will be admitted for 1-2 for hydration, post operative monitoring and pain control. He will be discharged home with foley in  place and foley will be removed in 14 days, He will have a cystogram prior to foley catheter removal

## 2018-10-29 NOTE — Transfer of Care (Signed)
Immediate Anesthesia Transfer of Care Note  Patient: Michael [name]  Procedure(s) Performed: XI ROBOTIC ASSISTED SIMPLE PROSTATECTOMY (N/A )  Patient Location: PACU  Anesthesia Type:General  Level of Consciousness: awake, alert  and patient cooperative  Airway & Oxygen Therapy: Patient Spontanous Breathing and Patient connected to face mask oxygen  Post-op Assessment: Report given to RN and Post -op Vital signs reviewed and stable  Post vital signs: Reviewed and stable  Last Vitals:  Vitals Value Taken Time  BP 180/91 10/29/18 1555  Temp    Pulse 81 10/29/18 1556  Resp 17 10/29/18 1556  SpO2 100 % 10/29/18 1556  Vitals shown include unvalidated device data.  Last Pain:  Vitals:   10/29/18 1002  TempSrc:   PainSc: 0-No pain         Complications: No apparent anesthesia complications

## 2018-10-30 ENCOUNTER — Encounter (HOSPITAL_COMMUNITY): Payer: Self-pay | Admitting: Urology

## 2018-10-30 LAB — HEMOGLOBIN AND HEMATOCRIT, BLOOD
HCT: 24 % — ABNORMAL LOW (ref 39.0–52.0)
HCT: 30 % — ABNORMAL LOW (ref 39.0–52.0)
Hemoglobin: 6.7 g/dL — CL (ref 13.0–17.0)
Hemoglobin: 8.7 g/dL — ABNORMAL LOW (ref 13.0–17.0)

## 2018-10-30 LAB — BASIC METABOLIC PANEL
Anion gap: 10 (ref 5–15)
BUN: 26 mg/dL — ABNORMAL HIGH (ref 8–23)
CO2: 22 mmol/L (ref 22–32)
Calcium: 7.9 mg/dL — ABNORMAL LOW (ref 8.9–10.3)
Chloride: 106 mmol/L (ref 98–111)
Creatinine, Ser: 1.57 mg/dL — ABNORMAL HIGH (ref 0.61–1.24)
GFR calc Af Amer: 53 mL/min — ABNORMAL LOW (ref >60)
GFR calc non Af Amer: 46 mL/min — ABNORMAL LOW (ref >60)
Glucose, Bld: 169 mg/dL — ABNORMAL HIGH (ref 70–99)
Potassium: 3.7 mmol/L (ref 3.5–5.1)
Sodium: 138 mmol/L (ref 135–145)

## 2018-10-30 LAB — PREPARE RBC (CROSSMATCH)

## 2018-10-30 MED ORDER — CHLORHEXIDINE GLUCONATE CLOTH 2 % EX PADS
6.0000 | MEDICATED_PAD | Freq: Every day | CUTANEOUS | Status: DC
  Administered 2018-10-30 – 2018-11-01 (×3): 6 via TOPICAL

## 2018-10-30 MED ORDER — SODIUM CHLORIDE 0.9% IV SOLUTION: Freq: Once | INTRAVENOUS | Status: AC

## 2018-10-30 MED ORDER — SODIUM CHLORIDE 0.9% IV SOLUTION
Freq: Once | INTRAVENOUS | Status: AC
  Administered 2018-10-30: 13:00:00 via INTRAVENOUS

## 2018-10-30 MED ORDER — ACETAMINOPHEN 500 MG PO TABS
1000.0000 mg | ORAL_TABLET | Freq: Four times a day (QID) | ORAL | Status: DC | PRN
  Administered 2018-10-31: 07:00:00 1000 mg via ORAL
  Filled 2018-10-30: qty 2

## 2018-10-30 NOTE — Progress Notes (Signed)
CRITICAL VALUE ALERT  Critical Value:  Hemoglobin 6.7  Date & Time Notied: 10/30/18;0517  Provider Notified: yes  Orders Received/Actions taken:awaiting any orders

## 2018-10-30 NOTE — Progress Notes (Signed)
1 Day Post-Op Subjective: The patient is doing well since takeback to OR yesterday.  No nausea or vomiting. Pain is adequately controlled. CBI running at fast drip.   Objective: Vital signs in last 24 hours: Temp:  [97.6 F (36.4 C)-99.7 F (37.6 C)] 97.6 F (36.4 C) (09/29 0450) Pulse Rate:  [73-95] 73 (09/29 0450) Resp:  [15-25] 16 (09/29 0450) BP: (132-187)/(70-111) 138/72 (09/29 0450) SpO2:  [93 %-100 %] 95 % (09/29 0450) Arterial Line BP: (123-209)/(61-97) 172/72 (09/28 1936) Weight:  [92.4 kg] 92.4 kg (09/29 0326)  Intake/Output from previous day: 09/28 0701 - 09/29 0700 In: [id number] [P.O.:1150; I.V.:2418; Blood:315; IV Piggyback:300] Out: [id number] [Urine:31600; Drains:75; Blood:100] Intake/Output this shift: No intake/output data recorded.  Physical Exam:  General: Alert and oriented. CV: Regular rate Lungs: Normal work of breathing, on 2L GI: Soft, appropriately tender, Nondistended. JP in place, serosang output.  Incisions: Clean, dry, and intact Urine: CBI on fast drip, urine in tubing and bag dark pink tinged, no blood clots.  Extremities: Nontender, no erythema, no edema.  Drain output 75 cc.   Lab Results: Recent Labs    10/29/18 1621 10/29/18 1826 10/30/18 0439  HGB 7.4* 8.2* 6.7*  HCT 27.2* 24.0* 24.0*      Assessment/Plan: POD# 1 s/p robotic simple prostatectomy for BPH with takeback to OR on POD0 for cystoscopy and clot evacuation. On POD1, CBI continues at fast drip with catheter draining well. Hgb 6.7 this morning.  - Continue current pain regimen - Wean O2 - Advance diet as tolerated, MLIV - Transfuse 2u pRBC for acute blood loss anemia, recheck H/H post transfusion this afternoon - Will not start prophylactic anticoagulation in setting of active bleeding.  - Continue drain until closer to discharge - Continue current home medications, hold aspirin given active bleeding.   LOS: 1 day   Haskel Schroeder 10/30/2018, 7:36 AM

## 2018-10-30 NOTE — Progress Notes (Signed)
Patient's Vitals were 98.11F;HR91;18;179/95;93% Room Air. Will notify the MD on call.

## 2018-10-30 NOTE — Progress Notes (Signed)
[facility] Answering service returned a call to this RN. No new orders at this moment

## 2018-10-31 LAB — BPAM RBC
Blood Product Expiration Date: 202010302359
Blood Product Expiration Date: 202010302359
Blood Product Expiration Date: 202010302359
ISSUE DATE / TIME: 202009281820
ISSUE DATE / TIME: 202009291311
ISSUE DATE / TIME: 202009291618
Unit Type and Rh: 5100
Unit Type and Rh: 5100
Unit Type and Rh: 5100

## 2018-10-31 LAB — BASIC METABOLIC PANEL
Anion gap: 9 (ref 5–15)
BUN: 25 mg/dL — ABNORMAL HIGH (ref 8–23)
CO2: 25 mmol/L (ref 22–32)
Calcium: 7.8 mg/dL — ABNORMAL LOW (ref 8.9–10.3)
Chloride: 107 mmol/L (ref 98–111)
Creatinine, Ser: 1.34 mg/dL — ABNORMAL HIGH (ref 0.61–1.24)
GFR calc Af Amer: >60 mL/min (ref >60)
GFR calc non Af Amer: 56 mL/min — ABNORMAL LOW (ref >60)
Glucose, Bld: 108 mg/dL — ABNORMAL HIGH (ref 70–99)
Potassium: 3.4 mmol/L — ABNORMAL LOW (ref 3.5–5.1)
Sodium: 141 mmol/L (ref 135–145)

## 2018-10-31 LAB — TYPE AND SCREEN
ABO/RH(D): O POS
Antibody Screen: NEGATIVE
Unit division: 0
Unit division: 0
Unit division: 0

## 2018-10-31 LAB — HEMOGLOBIN AND HEMATOCRIT, BLOOD
HCT: 29.4 % — ABNORMAL LOW (ref 39.0–52.0)
Hemoglobin: 8.5 g/dL — ABNORMAL LOW (ref 13.0–17.0)

## 2018-10-31 LAB — SURGICAL PATHOLOGY

## 2018-11-01 LAB — BASIC METABOLIC PANEL
Anion gap: 8 (ref 5–15)
BUN: 17 mg/dL (ref 8–23)
CO2: 23 mmol/L (ref 22–32)
Calcium: 7.9 mg/dL — ABNORMAL LOW (ref 8.9–10.3)
Chloride: 107 mmol/L (ref 98–111)
Creatinine, Ser: 1.07 mg/dL (ref 0.61–1.24)
GFR calc Af Amer: >60 mL/min (ref >60)
GFR calc non Af Amer: >60 mL/min (ref >60)
Glucose, Bld: 107 mg/dL — ABNORMAL HIGH (ref 70–99)
Potassium: 2.9 mmol/L — ABNORMAL LOW (ref 3.5–5.1)
Sodium: 138 mmol/L (ref 135–145)

## 2018-11-01 LAB — HEMOGLOBIN AND HEMATOCRIT, BLOOD
HCT: 29.6 % — ABNORMAL LOW (ref 39.0–52.0)
Hemoglobin: 8.6 g/dL — ABNORMAL LOW (ref 13.0–17.0)

## 2018-11-01 MED ORDER — OXYBUTYNIN CHLORIDE 5 MG PO TABS: 5.0000 mg | ORAL_TABLET | Freq: Three times a day (TID) | ORAL | 0 refills | Status: DC

## 2018-11-01 MED ORDER — OXYBUTYNIN CHLORIDE 5 MG PO TABS
5.0000 mg | ORAL_TABLET | Freq: Three times a day (TID) | ORAL | Status: DC
  Administered 2018-11-01: 5 mg via ORAL
  Filled 2018-11-01: qty 1

## 2018-11-01 NOTE — Discharge Summary (Signed)
Date of admission: 10/29/2018  Date of discharge: 11/01/2018  Admission diagnosis: Benign prostatic hypertrophy  Discharge diagnosis: Benign prostatic hypertrophy  History and Physical: For full details, please see admission history and physical. Briefly, Michael [name] is a 63 y.o. gentleman with BPH.  After discussing management/treatment options, he elected to proceed with surgical treatment.  Hospital Course: Michael [name] was taken to the operating room on 10/29/2018 and underwent a robotic assisted laparoscopic simple prostatectomy. This was complicated by clot retention in PACU resulting in takeback to OR on POD0 for clot evacuation and cystoscopy. Bladder appeared intact on cystoscopy, and he did well on CBI after initial clot evacuation.    Postoperatively, he was able to be transferred to a regular hospital room following recovery from anesthesia after takeback to OR.  He was able to begin ambulating on POD1. He remained hemodynamically stable. CBI was slowly weaned off.  He had excellent urine output with appropriately minimal output from his pelvic drain and his pelvic drain was removed prior to discharge.  He was transitioned to oral pain medication, tolerated a clear liquid diet, had been weaned off CBI and had met all discharge criteria and was able to be discharged home on POD3. Discharged with Foley catheter in place to drainage.  Laboratory values:  Recent Labs    10/30/18 2050 10/31/18 0519 11/01/18 0451  HGB 8.7* 8.5* 8.6*  HCT 30.0* 29.4* 29.6*   Physical Exam: Vitals:   11/01/18 0559 11/01/18 0610  BP: (!) 187/98 (!) 160/80  Pulse: 98   Resp: 18   Temp: 98.6 F (37 C)   SpO2: 94%    General: No acute distress Pulmonary: normal work of breathing Abdomen: soft, appropriately tender, non distended. Incisions c/d/i. GU: Foley in place draining light pink tinged urine with no clot.   Disposition: Home  Discharge instruction: He was instructed to be ambulatory but  to refrain from heavy lifting, strenuous activity, or driving. He was instructed on urethral catheter care.  Discharge medications:   Allergies as of 11/01/2018   No Known Allergies     Medication List    STOP taking these medications   aspirin EC 81 MG tablet   vitamin B-12 1000 MCG tablet Commonly known as: CYANOCOBALAMIN     TAKE these medications   atorvastatin 40 MG tablet Commonly known as: LIPITOR Take 1 tablet (40 mg total) by mouth every evening. Notes to patient: Please take this Medication this evening 11/01/2018   furosemide 40 MG tablet Commonly known as: LASIX Take 1 tablet (40 mg total) by mouth every evening. Notes to patient: Please take this Medication this evening 11/01/2018   HYDROcodone-acetaminophen 5-325 MG tablet Commonly known as: Norco Take 1-2 tablets by mouth every 6 (six) hours as needed.   isosorbide mononitrate 30 MG 24 hr tablet Commonly known as: IMDUR Take 1 tablet (30 mg total) by mouth every evening. Notes to patient: Please take this Medication this evening 11/01/2018   lisinopril 5 MG tablet Commonly known as: ZESTRIL Take 1 tablet (5 mg total) by mouth daily for 30 days. What changed: when to take this Notes to patient: Please take this Medication this evening 11/01/2018   metoprolol succinate 25 MG 24 hr tablet Commonly known as: TOPROL-XL Take 1 tablet (25 mg total) by mouth every evening. Notes to patient: Please take this Medication this evening 11/01/2018   nitroGLYCERIN 0.4 MG SL tablet Commonly known as: NITROSTAT Place 1 tablet (0.4 mg total) under the tongue every 5 (five)  minutes as needed for chest pain. What changed: when to take this   oxybutynin 5 MG tablet Commonly known as: DITROPAN Take 1 tablet (5 mg total) by mouth 3 (three) times daily. Notes to patient: Last Dose of this Medication was given on 11/01/2018 at 8:21 am   sulfamethoxazole-trimethoprim 800-160 MG tablet Commonly known as: BACTRIM DS Take 1  tablet by mouth 2 (two) times daily. Start the day prior to foley removal appointment Notes to patient: This Medication is to be taken Two daily the Day PRIOR to foley removal        Followup: He will followup for catheter removal outpatient.

## 2018-11-01 NOTE — Progress Notes (Signed)
Pt to be discharged to home this afternoon. Discharge teaching including foley and leg bag teaching done with Pt. Medications and schedules reviewed with Pt. Pt verbalized understanding of all discharge teaching/instructions

## 2018-12-19 ENCOUNTER — Ambulatory Visit (INDEPENDENT_AMBULATORY_CARE_PROVIDER_SITE_OTHER): Payer: Medicaid Other | Admitting: Urology

## 2018-12-19 DIAGNOSIS — N401 Enlarged prostate with lower urinary tract symptoms: Secondary | ICD-10-CM

## 2018-12-24 NOTE — Progress Notes (Signed)
Cardiology Office Note  Date: 12/25/2018   ID: Michael [name], DOB 12-02-1955, MRN [id number]  PCP:  [name], NP  Cardiologist:  Rozann Lesches, MD Electrophysiologist:  None   Chief Complaint  Patient presents with  . Cardiac follow-up    History of Present Illness: Michael [name] is a 63 y.o. male last seen in September.  He is here for a follow-up visit.  From a cardiac perspective, he does not report any obvious angina symptoms and has stable NYHA class II dyspnea.  I reviewed his medications.  He states that he has not been taking any of them since his last surgery.  Blood pressure is elevated today.  He underwent a robotic assisted laparoscopic prostatectomy in September.  There were no obvious perioperative cardiac complications based on review of the discharge summary.  We went over his prior medication list and will plan to resume low-dose aspirin along with Lipitor, lisinopril, Imdur, and Toprol-XL.  He has as needed nitroglycerin available if needed.  I reviewed his recent ECG from September.  Past Medical History:  Diagnosis Date  . Carotid artery disease ([place])   . Chronic diastolic heart failure ([place])   . CKD (chronic kidney disease) stage 3, GFR 30-59 ml/min   . COPD (chronic obstructive pulmonary disease) ([place])   . Coronary artery disease   . Essential hypertension   . Head and neck cancer ([place]) 2019   Right facial basal cell carcinoma s/p resection with right partial mastectomy and partal rhinectomy with skin graft (06/13/17)  . Iron deficiency anemia   . Urinary retention     Past Surgical History:  Procedure Laterality Date  . ANKLE CLOSED REDUCTION Right    open reduction  . BASAL CELL CARCINOMA EXCISION  2019   [facility]  . COLONOSCOPY N/A 05/31/2017   Procedure: COLONOSCOPY;  Surgeon: Daneil Dolin, MD;  [facility];  Service: Endoscopy;  Laterality: N/A;  2:45pm  . CYSTOSCOPY N/A 10/29/2018   Procedure: Jilda Panda;   Surgeon: Ceasar Mons, MD;  Location: [facility];  Service: Urology;  Laterality: N/A;  . POLYPECTOMY  05/31/2017   Procedure: POLYPECTOMY;  Surgeon: Daneil Dolin, MD;  [facility];  Service: Endoscopy;;  . TONSILLECTOMY    . XI ROBOTIC ASSISTED SIMPLE PROSTATECTOMY N/A 10/29/2018   Procedure: XI ROBOTIC ASSISTED SIMPLE PROSTATECTOMY;  Surgeon: Cleon Gustin, MD;  Location: [facility];  Service: Urology;  Laterality: N/A;    Current Outpatient Medications  Medication Sig Dispense Refill  . aspirin EC 81 MG tablet Take 81 mg by mouth daily.    Marland Kitchen atorvastatin (LIPITOR) 40 MG tablet Take 1 tablet (40 mg total) by mouth every evening. 90 tablet 3  . furosemide (LASIX) 40 MG tablet Take 1 tablet (40 mg total) by mouth every evening. 90 tablet 3  . isosorbide mononitrate (IMDUR) 30 MG 24 hr tablet Take 1 tablet (30 mg total) by mouth every evening. 90 tablet 3  . lisinopril (PRINIVIL,ZESTRIL) 5 MG tablet Take 1 tablet (5 mg total) by mouth daily for 30 days. (Patient taking differently: Take 5 mg by mouth every evening. ) 30 tablet 0  . metoprolol succinate (TOPROL-XL) 25 MG 24 hr tablet Take 1 tablet (25 mg total) by mouth every evening. 90 tablet 3  . nitroGLYCERIN (NITROSTAT) 0.4 MG SL tablet Place 1 tablet (0.4 mg total) under the tongue every 5 (five) minutes as needed for chest pain. (Patient taking differently: Place  0.4 mg under the tongue every 5 (five) minutes x 3 doses as needed for chest pain. ) 90 tablet 3  . oxybutynin (DITROPAN) 5 MG tablet Take 1 tablet (5 mg total) by mouth 3 (three) times daily. 30 tablet 0   No current facility-administered medications for this visit.    Allergies:  Patient has no known allergies.   Social History: The patient  reports that he has been smoking cigarettes. He has a 20.00 pack-year smoking history. He has never used smokeless tobacco. He reports previous alcohol use. He reports that he does not use drugs.   ROS:  Please see  the history of present illness. Otherwise, complete review of systems is positive for none.  All other systems are reviewed and negative.   Physical Exam: VS:  BP (!) 157/85   Pulse 80   Temp (!) 96.6 F (35.9 C)   Ht 6' (1.829 m)   Wt 189 lb (85.7 kg)   SpO2 98%   BMI 25.63 kg/m , BMI Body mass index is 25.63 kg/m.  Wt Readings from Last 3 Encounters:  12/25/18 189 lb (85.7 kg)  10/30/18 203 lb 9.6 oz (92.4 kg)  10/25/18 201 lb (91.2 kg)    General: Patient appears comfortable at rest. HEENT: Right facial scar/deformity following prior ENT surgery, wearing a mask. Neck: Supple, no elevated JVP or carotid bruits, no thyromegaly. Lungs: Clear to auscultation, nonlabored breathing at rest. Cardiac: Regular rate and rhythm, no S3, soft systolic murmur. Abdomen: Soft, nontender, bowel sounds present. Extremities: No pitting edema, distal pulses 2+. Skin: Warm and dry. Musculoskeletal: No kyphosis. Neuropsychiatric: Alert and oriented x3, affect grossly appropriate.  ECG:  An ECG dated 10/25/2018 was personally reviewed today and demonstrated:  Sinus rhythm with prolonged PR interval, possible old anterior infarct pattern, frequent PVCs.  Recent Labwork: 05/10/2018: ALT 19; AST 29 05/12/2018: B Natriuretic Peptide 594.6; Magnesium 2.1 10/24/2018: Platelets 462 11/01/2018: BUN 17; Creatinine, Ser 1.07; Hemoglobin 8.6; Potassium 2.9; Sodium 138     Component Value Date/Time   CHOL 194 04/02/2017 0541   TRIG 49 04/02/2017 0541   HDL 42 04/02/2017 0541   CHOLHDL 4.6 04/02/2017 0541   VLDL 10 04/02/2017 0541   LDLCALC 142 (H) 04/02/2017 0541    Other Studies Reviewed Today:  Echocardiogram 12/05/2017: Study Conclusions  - Left ventricle: The cavity size was normal. Wall thickness was increased in a pattern of mild LVH. Systolic function was mildly reduced. The estimated ejection fraction was in the range of 45% to 50%. There is hypokinesis of the basal-midinferolateral  myocardium. Features are consistent with a pseudonormal left ventricular filling pattern, with concomitant abnormal relaxation and increased filling pressure (grade 2 diastolic dysfunction). - Aortic valve: Mildly calcified annulus. Trileaflet; mildly calcified leaflets. - Mitral valve: Mildly calcified annulus. There was mild regurgitation. - Left atrium: The atrium was mildly dilated. - Tricuspid valve: There was trivial regurgitation. - Pulmonary arteries: Systolic pressure could not be accurately estimated. - Pericardium, extracardiac: A trivial pericardial effusion was identified anterior to the heart.  Lexiscan Myoview 10/19/2017:  Findings consistent with prior inferior/septal myocardial infarction with mild peri-infarct ischemia.  This is a high risk study. Risk primarily based on decreased LVEF. There is a prior infarct with only mild current ischemia. Consider correlating LVEF with echocardiogram.  The left ventricular ejection fraction is moderately decreased (33%).  Assessment and Plan:  1.  Ischemic heart disease based on previous noninvasive cardiac studies from last year.  He reports no active  angina symptoms, LVEF 45 to 50%, and evidence of inferior/septal scar with mild peri-infarct ischemia.  He has not been taking his regular cardiac medications, plan to resume aspirin, Lipitor, Toprol-XL, and lisinopril.  These can be further titrated as needed.  2.  Moderate, heavily calcified carotid artery disease felt to be best managed medically at this point following evaluation by Dr. Oneida Alar.  Continue aspirin and statin.  3.  Tobacco abuse.  Smoking cessation has been discussed.  4.  Essential hypertension, blood pressure elevated today.  He has not been taking medications regularly, these will be resumed.  Medication Adjustments/Labs and Tests Ordered: Current medicines are reviewed at length with the patient today.  Concerns regarding medicines are  outlined above.   Tests Ordered: No orders of the defined types were placed in this encounter.   Medication Changes: No orders of the defined types were placed in this encounter.   Disposition:  Follow up 6 months in the [place] office.  Signed, Satira Sark, MD, [facility] 12/25/2018 1:20 PM    [facility] at [facility] [address] [place], [place] [address] Phone: [phone]; Fax: [phone]

## 2018-12-25 ENCOUNTER — Encounter: Payer: Self-pay | Admitting: Cardiology

## 2018-12-25 ENCOUNTER — Other Ambulatory Visit: Payer: Self-pay

## 2018-12-25 ENCOUNTER — Ambulatory Visit (INDEPENDENT_AMBULATORY_CARE_PROVIDER_SITE_OTHER): Payer: Medicaid Other | Admitting: Cardiology

## 2018-12-25 VITALS — BP 157/85 | HR 80 | Temp 96.6°F | Ht 72.0 in | Wt 189.0 lb

## 2018-12-25 DIAGNOSIS — I429 Cardiomyopathy, unspecified: Secondary | ICD-10-CM

## 2018-12-25 DIAGNOSIS — I6523 Occlusion and stenosis of bilateral carotid arteries: Secondary | ICD-10-CM | POA: Diagnosis not present

## 2018-12-25 DIAGNOSIS — I259 Chronic ischemic heart disease, unspecified: Secondary | ICD-10-CM

## 2018-12-25 DIAGNOSIS — I10 Essential (primary) hypertension: Secondary | ICD-10-CM | POA: Diagnosis not present

## 2018-12-25 NOTE — Patient Instructions (Signed)
Medication Instructions:  Your physician recommends that you continue on your current medications as directed. Please refer to the Current Medication list given to you today.  *If you need a refill on your cardiac medications before your next appointment, please call your pharmacy*  Lab Work: None today If you have labs (blood work) drawn today and your tests are completely normal, you will receive your results only by: . MyChart Message (if you have MyChart) OR . A paper copy in the mail If you have any lab test that is abnormal or we need to change your treatment, we will call you to review the results.  Testing/Procedures: None today  Follow-Up: At [facility], you and your health needs are our priority.  As part of our continuing mission to provide you with exceptional heart care, we have created designated Provider Care Teams.  These Care Teams include your primary Cardiologist (physician) and Advanced Practice Providers (APPs -  Physician Assistants and Nurse Practitioners) who all work together to provide you with the care you need, when you need it.  Your next appointment:   6 months  The format for your next appointment:   In Person  Provider:   Samuel McDowell, MD  Other Instructions None     Thank you for choosing [facility] !         

## 2019-02-06 ENCOUNTER — Ambulatory Visit: Payer: Medicare Other

## 2019-03-01 ENCOUNTER — Other Ambulatory Visit: Payer: Self-pay

## 2019-03-01 ENCOUNTER — Inpatient Hospital Stay (HOSPITAL_COMMUNITY): Payer: Medicaid Other

## 2019-03-01 ENCOUNTER — Encounter (HOSPITAL_COMMUNITY): Payer: Self-pay | Admitting: Nurse Practitioner

## 2019-03-01 ENCOUNTER — Inpatient Hospital Stay (HOSPITAL_COMMUNITY): Payer: Medicaid Other | Attending: Nurse Practitioner | Admitting: Nurse Practitioner

## 2019-03-01 VITALS — BP 194/95 | HR 72 | Temp 97.1°F | Resp 18 | Wt 194.8 lb

## 2019-03-01 DIAGNOSIS — N183 Chronic kidney disease, stage 3 unspecified: Secondary | ICD-10-CM | POA: Insufficient documentation

## 2019-03-01 DIAGNOSIS — J449 Chronic obstructive pulmonary disease, unspecified: Secondary | ICD-10-CM | POA: Insufficient documentation

## 2019-03-01 DIAGNOSIS — F17210 Nicotine dependence, cigarettes, uncomplicated: Secondary | ICD-10-CM | POA: Insufficient documentation

## 2019-03-01 DIAGNOSIS — Z7982 Long term (current) use of aspirin: Secondary | ICD-10-CM | POA: Insufficient documentation

## 2019-03-01 DIAGNOSIS — Z79899 Other long term (current) drug therapy: Secondary | ICD-10-CM | POA: Diagnosis not present

## 2019-03-01 DIAGNOSIS — Z9079 Acquired absence of other genital organ(s): Secondary | ICD-10-CM | POA: Insufficient documentation

## 2019-03-01 DIAGNOSIS — D509 Iron deficiency anemia, unspecified: Secondary | ICD-10-CM | POA: Insufficient documentation

## 2019-03-01 DIAGNOSIS — Z85828 Personal history of other malignant neoplasm of skin: Secondary | ICD-10-CM | POA: Diagnosis not present

## 2019-03-01 DIAGNOSIS — D500 Iron deficiency anemia secondary to blood loss (chronic): Secondary | ICD-10-CM

## 2019-03-01 DIAGNOSIS — I10 Essential (primary) hypertension: Secondary | ICD-10-CM | POA: Insufficient documentation

## 2019-03-01 LAB — COMPREHENSIVE METABOLIC PANEL
ALT: 18 U/L (ref 0–44)
AST: 21 U/L (ref 15–41)
Albumin: 3.6 g/dL (ref 3.5–5.0)
Alkaline Phosphatase: 132 U/L — ABNORMAL HIGH (ref 38–126)
Anion gap: 10 (ref 5–15)
BUN: 21 mg/dL (ref 8–23)
CO2: 27 mmol/L (ref 22–32)
Calcium: 9.1 mg/dL (ref 8.9–10.3)
Chloride: 105 mmol/L (ref 98–111)
Creatinine, Ser: 1.24 mg/dL (ref 0.61–1.24)
GFR calc Af Amer: >60 mL/min (ref >60)
GFR calc non Af Amer: >60 mL/min (ref >60)
Glucose, Bld: 109 mg/dL — ABNORMAL HIGH (ref 70–99)
Potassium: 3.6 mmol/L (ref 3.5–5.1)
Sodium: 142 mmol/L (ref 135–145)
Total Bilirubin: 0.4 mg/dL (ref 0.3–1.2)
Total Protein: 7.7 g/dL (ref 6.5–8.1)

## 2019-03-01 LAB — CBC WITH DIFFERENTIAL/PLATELET
Abs Immature Granulocytes: 0.03 10*3/uL (ref 0.00–0.07)
Basophils Absolute: 0.1 10*3/uL (ref 0.0–0.1)
Basophils Relative: 1 %
Eosinophils Absolute: 0.2 10*3/uL (ref 0.0–0.5)
Eosinophils Relative: 3 %
HCT: 29.1 % — ABNORMAL LOW (ref 39.0–52.0)
Hemoglobin: 7.7 g/dL — ABNORMAL LOW (ref 13.0–17.0)
Immature Granulocytes: 0 %
Lymphocytes Relative: 27 %
Lymphs Abs: 2.1 10*3/uL (ref 0.7–4.0)
MCH: 17.8 pg — ABNORMAL LOW (ref 26.0–34.0)
MCHC: 26.5 g/dL — ABNORMAL LOW (ref 30.0–36.0)
MCV: 67.2 fL — ABNORMAL LOW (ref 80.0–100.0)
Monocytes Absolute: 0.8 10*3/uL (ref 0.1–1.0)
Monocytes Relative: 10 %
Neutro Abs: 4.8 10*3/uL (ref 1.7–7.7)
Neutrophils Relative %: 59 %
Platelets: 487 10*3/uL — ABNORMAL HIGH (ref 150–400)
RBC: 4.33 MIL/uL (ref 4.22–5.81)
RDW: 17 % — ABNORMAL HIGH (ref 11.5–15.5)
WBC: 8 10*3/uL (ref 4.0–10.5)
nRBC: 0 % (ref 0.0–0.2)

## 2019-03-01 LAB — IRON AND TIBC
Iron: 14 ug/dL — ABNORMAL LOW (ref 45–182)
Saturation Ratios: 3 % — ABNORMAL LOW (ref 17.9–39.5)
TIBC: 553 ug/dL — ABNORMAL HIGH (ref 250–450)
UIBC: 539 ug/dL

## 2019-03-01 LAB — FERRITIN: Ferritin: 3 ng/mL — ABNORMAL LOW (ref 24–336)

## 2019-03-01 LAB — FOLATE: Folate: 9.3 ng/mL (ref >5.9)

## 2019-03-01 LAB — VITAMIN B12: Vitamin B-12: 500 pg/mL (ref 180–914)

## 2019-03-01 LAB — LACTATE DEHYDROGENASE: LDH: 97 U/L — ABNORMAL LOW (ref 98–192)

## 2019-03-01 LAB — VITAMIN D 25 HYDROXY (VIT D DEFICIENCY, FRACTURES): Vit D, 25-Hydroxy: 13.34 ng/mL — ABNORMAL LOW (ref 30–100)

## 2019-03-01 NOTE — Progress Notes (Signed)
[place], [place] [address]   CLINIC:  Medical Oncology/Hematology  PCP:  [name], NP [address] [place] [address] [phone]   REASON FOR VISIT: Follow-up for iron deficiency anemia  CURRENT THERAPY: Intermittent iron infusions   INTERVAL HISTORY:  Michael [name] 64 y.o. male returns for routine follow-up for iron deficiency anemia.  Patient reports he is more fatigued than normal.  Patient reports he was sent here by his PCP for anemia.  He denies any bright red bleeding per rectum or melena.  He denies any easy bruising or bleeding. Denies any nausea, vomiting, or diarrhea. Denies any new pains. Had not noticed any recent bleeding such as epistaxis, hematuria or hematochezia. Denies recent chest pain on exertion, shortness of breath on minimal exertion, pre-syncopal episodes, or palpitations. Denies any numbness or tingling in hands or feet. Denies any recent fevers, infections, or recent hospitalizations. Patient reports appetite at 100% and energy level at 75%.  He is eating well maintaining his weight this time.     REVIEW OF SYSTEMS:  Review of Systems  Constitutional: Positive for fatigue.  Neurological: Positive for numbness.  Psychiatric/Behavioral: Positive for sleep disturbance.  All other systems reviewed and are negative.    PAST MEDICAL/SURGICAL HISTORY:  Past Medical History:  Diagnosis Date  . Carotid artery disease ([place])   . Chronic diastolic heart failure ([place])   . CKD (chronic kidney disease) stage 3, GFR 30-59 ml/min   . COPD (chronic obstructive pulmonary disease) ([place])   . Coronary artery disease   . Essential hypertension   . Head and neck cancer ([place]) 2019   Right facial basal cell carcinoma s/p resection with right partial mastectomy and partal rhinectomy with skin graft (06/13/17)  . Iron deficiency anemia   . Urinary retention    Past Surgical History:  Procedure Laterality Date  .  ANKLE CLOSED REDUCTION Right    open reduction  . BASAL CELL CARCINOMA EXCISION  2019   [facility]  . COLONOSCOPY N/A 05/31/2017   Procedure: COLONOSCOPY;  Surgeon: Daneil Dolin, MD;  [facility];  Service: Endoscopy;  Laterality: N/A;  2:45pm  . CYSTOSCOPY N/A 10/29/2018   Procedure: Jilda Panda;  Surgeon: Ceasar Mons, MD;  [facility];  Service: Urology;  Laterality: N/A;  . POLYPECTOMY  05/31/2017   Procedure: POLYPECTOMY;  Surgeon: Daneil Dolin, MD;  [facility];  Service: Endoscopy;;  . TONSILLECTOMY    . XI ROBOTIC ASSISTED SIMPLE PROSTATECTOMY N/A 10/29/2018   Procedure: XI ROBOTIC ASSISTED SIMPLE PROSTATECTOMY;  Surgeon: Cleon Gustin, MD;  Location[facility];  Service: Urology;  Laterality: N/A;     SOCIAL HISTORY:  Social History   Socioeconomic History  . Marital status: Married    Spouse name: Not on file  . Number of children: Not on file  . Years of education: Not on file  . Highest education level: Not on file  Occupational History  . Not on file  Tobacco Use  . Smoking status: Current Every Day Smoker    Packs/day: 0.50    Years: 40.00    Pack years: 20.00    Types: Cigarettes  . Smokeless tobacco: Never Used  Substance and Sexual Activity  . Alcohol use: Not Currently  . Drug use: Never  . Sexual activity: Not on file  Other Topics Concern  . Not on file  Social History Narrative  .  Not on file   Social Determinants of Health   Financial Resource Strain:   . Difficulty of Paying Living Expenses: Not on file  Food Insecurity:   . Worried About Charity fundraiser in the Last Year: Not on file  . Ran Out of Food in the Last Year: Not on file  Transportation Needs:   . Lack of Transportation (Medical): Not on file  . Lack of Transportation (Non-Medical): Not on file  Physical Activity:   . Days of Exercise per Week: Not on file  . Minutes of Exercise per Session: Not on file  Stress:   .  Feeling of Stress : Not on file  Social Connections:   . Frequency of Communication with Friends and Family: Not on file  . Frequency of Social Gatherings with Friends and Family: Not on file  . Attends Religious Services: Not on file  . Active Member of Clubs or Organizations: Not on file  . Attends Archivist Meetings: Not on file  . Marital Status: Not on file  Intimate Partner Violence:   . Fear of Current or Ex-Partner: Not on file  . Emotionally Abused: Not on file  . Physically Abused: Not on file  . Sexually Abused: Not on file    FAMILY HISTORY:  Family History  Problem Relation Age of Onset  . Stroke Father   . Cirrhosis Mother   . Colon cancer Neg Hx     CURRENT MEDICATIONS:  Outpatient Encounter Medications as of 03/01/2019  Medication Sig  . aspirin EC 81 MG tablet Take 81 mg by mouth daily.  Marland Kitchen atorvastatin (LIPITOR) 40 MG tablet Take 1 tablet (40 mg total) by mouth every evening.  . furosemide (LASIX) 40 MG tablet Take 1 tablet (40 mg total) by mouth every evening.  . isosorbide mononitrate (IMDUR) 30 MG 24 hr tablet Take 1 tablet (30 mg total) by mouth every evening.  Marland Kitchen lisinopril (PRINIVIL,ZESTRIL) 5 MG tablet Take 1 tablet (5 mg total) by mouth daily for 30 days. (Patient taking differently: Take 5 mg by mouth every evening. )  . metoprolol succinate (TOPROL-XL) 25 MG 24 hr tablet Take 1 tablet (25 mg total) by mouth every evening.  Marland Kitchen oxybutynin (DITROPAN) 5 MG tablet Take 1 tablet (5 mg total) by mouth 3 (three) times daily.  . nitroGLYCERIN (NITROSTAT) 0.4 MG SL tablet Place 1 tablet (0.4 mg total) under the tongue every 5 (five) minutes as needed for chest pain. (Patient not taking: Reported on 03/01/2019)   No facility-administered encounter medications on file as of 03/01/2019.    ALLERGIES:  No Known Allergies   PHYSICAL EXAM:  ECOG Performance status: 1  Vitals:   03/01/19 0925  BP: (!) 194/95  Pulse: 72  Resp: 18  Temp: (!) 97.1 F  (36.2 C)  SpO2: 100%   Filed Weights   03/01/19 0925  Weight: 194 lb 12.8 oz (88.4 kg)    Physical Exam Constitutional:      Appearance: Normal appearance. He is normal weight.  HENT:     Nose:     Comments: Scarring from basal cell carcinoma removal Cardiovascular:     Rate and Rhythm: Normal rate and regular rhythm.     Heart sounds: Normal heart sounds.  Pulmonary:     Effort: Pulmonary effort is normal.     Breath sounds: Normal breath sounds.  Abdominal:     General: Bowel sounds are normal.     Palpations: Abdomen is soft.  Musculoskeletal:        General: Normal range of motion.  Skin:    General: Skin is warm.  Neurological:     Mental Status: He is alert and oriented to person, place, and time. Mental status is at baseline.  Psychiatric:        Mood and Affect: Mood normal.        Behavior: Behavior normal.        Thought Content: Thought content normal.        Judgment: Judgment normal.      LABORATORY DATA:  I have reviewed the labs as listed.  CBC    Component Value Date/Time   WBC 8.0 03/01/2019 1108   RBC 4.33 03/01/2019 1108   HGB 7.7 (L) 03/01/2019 1108   HGB 13.8 04/23/2011 0919   HCT 29.1 (L) 03/01/2019 1108   HCT 26.0 (L) 05/09/2018 0455   PLT 487 (H) 03/01/2019 1108   PLT 208 04/23/2011 0919   MCV 67.2 (L) 03/01/2019 1108   MCV 84 04/23/2011 0919   MCH 17.8 (L) 03/01/2019 1108   MCHC 26.5 (L) 03/01/2019 1108   RDW 17.0 (H) 03/01/2019 1108   RDW 12.3 04/23/2011 0919   LYMPHSABS 2.1 03/01/2019 1108   LYMPHSABS 1.1 04/23/2011 0919   MONOABS 0.8 03/01/2019 1108   MONOABS 0.9 (H) 04/23/2011 0919   EOSABS 0.2 03/01/2019 1108   EOSABS 0.0 04/23/2011 0919   BASOSABS 0.1 03/01/2019 1108   BASOSABS 0.0 04/23/2011 0919   CMP Latest Ref Rng & Units 03/01/2019 11/01/2018 10/31/2018  Glucose 70 - 99 mg/dL 109(H) 107(H) 108(H)  BUN 8 - 23 mg/dL 21 17 25(H)  Creatinine 0.61 - 1.24 mg/dL 1.24 1.07 1.34(H)  Sodium 135 - 145 mmol/L 142 138 141    Potassium 3.5 - 5.1 mmol/L 3.6 2.9(L) 3.4(L)  Chloride 98 - 111 mmol/L 105 107 107  CO2 22 - 32 mmol/L 27 23 25   Calcium 8.9 - 10.3 mg/dL 9.1 7.9(L) 7.8(L)  Total Protein 6.5 - 8.1 g/dL 7.7 - -  Total Bilirubin 0.3 - 1.2 mg/dL 0.4 - -  Alkaline Phos 38 - 126 U/L 132(H) - -  AST 15 - 41 U/L 21 - -  ALT 0 - 44 U/L 18 - -    I personally performed a face-to-face visit.  All questions were answered to patient's stated satisfaction. Encouraged patient to call with any new concerns or questions before his next visit to the cancer center and we can certain see him sooner, if needed.     ASSESSMENT & PLAN:   Iron (Fe) deficiency anemia 1.  Iron deficiency anemia: -Likely due to CKD. -Last Feraheme infusion was on 05/10/2018. -He denies any bright red bleeding per rectum or melena. -Labs done on 03/01/2019 showed his hemoglobin 7.7, ferritin 3, percent saturation 3 -Patient is able to walk around without shortness of breath or extreme fatigue or weakness. -He will get 2 infusions of IV iron 1 week apart.  He will get his first iron infusion on Tuesday. -He will follow up in 6 weeks with repeat labs.  2.  Locally advanced basal cell carcinoma of the right nasolabial fold: -Biopsy of the right nasal mucosa on 04/05/2017 showed basal cell carcinoma. -Biopsy of the left cheek lesion on 04/02/2017 was consistent with basal cell carcinoma. -He was initially evaluated by Dr. Blenda Nicely at [facility] and then was referred to Dr. Mardee Postin at [facility]. -He underwent resection with skin graft on 06/06/2017.  The margins  was close.  Rest of the margins were negative.  He was presented at tumor board at [facility].  Recommendation was not to give any adjuvant therapy.  Overall he is very happy with the results. -He follows up with his doctors at [facility].  3.  Uncontrolled hypertension: -Patient started back on his Coreg 12.5 mg twice daily.  His blood pressure is still elevated at 194/95. -She will also continue  Lipitor 40 mg daily and aspirin 81 mg daily. -He is following up with his PCP.  PCP aware of high blood pressure saw him a week and a half ago.      Orders placed this encounter:  Orders Placed This Encounter  Procedures  . Lactate dehydrogenase  . CBC with Differential/Platelet  . Comprehensive metabolic panel  . Ferritin  . Iron and TIBC  . Vitamin B12  . VITAMIN D 25 Hydroxy (Vit-D Deficiency, Fractures)  . Folate  . CBC with Differential/Platelet  . Lactate dehydrogenase  . CBC with Differential/Platelet  . Comprehensive metabolic panel  . Ferritin  . Iron and TIBC  . Vitamin B12  . VITAMIN D 25 Hydroxy (Vit-D Deficiency, Fractures)  . Folate      Francene Finders, FNP-C [place] [phone]

## 2019-03-01 NOTE — Assessment & Plan Note (Addendum)
1.  Iron deficiency anemia: -Likely due to CKD. -Last Feraheme infusion was on 05/10/2018. -He denies any bright red bleeding per rectum or melena. -Labs done on 03/01/2019 showed his hemoglobin 7.7, ferritin 3, percent saturation 3 -Patient is able to walk around without shortness of breath or extreme fatigue or weakness. -He will get 2 infusions of IV iron 1 week apart.  He will get his first iron infusion on Tuesday. -He will follow up in 6 weeks with repeat labs.  2.  Locally advanced basal cell carcinoma of the right nasolabial fold: -Biopsy of the right nasal mucosa on 04/05/2017 showed basal cell carcinoma. -Biopsy of the left cheek lesion on 04/02/2017 was consistent with basal cell carcinoma. -He was initially evaluated by Dr. Blenda Nicely at [facility] and then was referred to Dr. Mardee Postin at [facility]. -He underwent resection with skin graft on 06/06/2017.  The margins was close.  Rest of the margins were negative.  He was presented at tumor board at [facility].  Recommendation was not to give any adjuvant therapy.  Overall he is very happy with the results. -He follows up with his doctors at [facility].  3.  Uncontrolled hypertension: -Patient started back on his Coreg 12.5 mg twice daily.  His blood pressure is still elevated at 194/95. -She will also continue Lipitor 40 mg daily and aspirin 81 mg daily. -He is following up with his PCP.  PCP aware of high blood pressure saw him a week and a half ago.

## 2019-03-01 NOTE — Patient Instructions (Signed)
[place] at [facility] Discharge Instructions  Follow-up in 6 weeks with repeat labs.   Thank you for choosing [place] at [facility] to provide your oncology and hematology care.  To afford each patient quality time with our provider, please arrive at least 15 minutes before your scheduled appointment time.   If you have a lab appointment with the [facility] please come in thru the Main Entrance and check in at the main information desk.  You need to re-schedule your appointment should you arrive 10 or more minutes late.  We strive to give you quality time with our providers, and arriving late affects you and other patients whose appointments are after yours.  Also, if you no show three or more times for appointments you may be dismissed from the clinic at the providers discretion.     Again, thank you for choosing [facility].  Our hope is that these requests will decrease the amount of time that you wait before being seen by our physicians.       _____________________________________________________________  Should you have questions after your visit to [facility], please contact our office at [phone] between the hours of 8:00 a.m. and 4:30 p.m.  Voicemails left after 4:00 p.m. will not be returned until the following business day.  For prescription refill requests, have your pharmacy contact our office and allow 72 hours.    Due to Covid, you will need to wear a mask upon entering the hospital. If you do not have a mask, a mask will be given to you at the Main Entrance upon arrival. For doctor visits, patients may have 1 support person with them. For treatment visits, patients can not have anyone with them due to social distancing guidelines and our immunocompromised population.

## 2019-03-05 ENCOUNTER — Other Ambulatory Visit: Payer: Self-pay

## 2019-03-05 ENCOUNTER — Inpatient Hospital Stay (HOSPITAL_COMMUNITY): Payer: Medicaid Other | Attending: Hematology

## 2019-03-05 VITALS — BP 149/78 | HR 64 | Temp 97.3°F | Resp 18

## 2019-03-05 DIAGNOSIS — D500 Iron deficiency anemia secondary to blood loss (chronic): Secondary | ICD-10-CM

## 2019-03-05 DIAGNOSIS — D509 Iron deficiency anemia, unspecified: Secondary | ICD-10-CM | POA: Diagnosis not present

## 2019-03-05 MED ORDER — SODIUM CHLORIDE 0.9 % IV SOLN: INTRAVENOUS | Status: DC

## 2019-03-05 MED ORDER — SODIUM CHLORIDE 0.9 % IV SOLN
510.0000 mg | Freq: Once | INTRAVENOUS | Status: AC
  Administered 2019-03-05: 510 mg via INTRAVENOUS
  Filled 2019-03-05: qty 510

## 2019-03-05 NOTE — Patient Instructions (Signed)
[place] at [facility]  Discharge Instructions:  IV iron received today. Follow up next week as scheduled for your second dose. _______________________________________________________________  Thank you for choosing [place] at [facility] to provide your oncology and hematology care.  To afford each patient quality time with our providers, please arrive at least 15 minutes before your scheduled appointment.  You need to re-schedule your appointment if you arrive 10 or more minutes late.  We strive to give you quality time with our providers, and arriving late affects you and other patients whose appointments are after yours.  Also, if you no show three or more times for appointments you may be dismissed from the clinic.  Again, thank you for choosing [place] at [place] hope is that these requests will allow you access to exceptional care and in a timely manner. _______________________________________________________________  If you have questions after your visit, please contact our office at [phone] between the hours of 8:30 a.m. and 5:00 p.m. Voicemails left after 4:30 p.m. will not be returned until the following business day. _______________________________________________________________  For prescription refill requests, have your pharmacy contact our office. _______________________________________________________________  Recommendations made by the consultant and any test results will be sent to your referring physician. _______________________________________________________________

## 2019-03-05 NOTE — Progress Notes (Signed)
Michael [name] presents today for IV iron infusion. Infusion tolerated without incident or complaint. See MAR for details. VSS prior to and post infusion. Discharged in satisfactory condition with follow up instructions. °

## 2019-03-12 ENCOUNTER — Inpatient Hospital Stay (HOSPITAL_COMMUNITY): Payer: Medicaid Other

## 2019-03-12 ENCOUNTER — Other Ambulatory Visit: Payer: Self-pay

## 2019-03-12 VITALS — BP 165/76 | HR 77 | Temp 97.6°F | Resp 18

## 2019-03-12 DIAGNOSIS — D500 Iron deficiency anemia secondary to blood loss (chronic): Secondary | ICD-10-CM

## 2019-03-12 DIAGNOSIS — D509 Iron deficiency anemia, unspecified: Secondary | ICD-10-CM | POA: Diagnosis not present

## 2019-03-12 MED ORDER — ACETAMINOPHEN 325 MG PO TABS
650.0000 mg | ORAL_TABLET | Freq: Once | ORAL | Status: AC
  Administered 2019-03-12: 650 mg via ORAL

## 2019-03-12 MED ORDER — ACETAMINOPHEN 325 MG PO TABS
ORAL_TABLET | ORAL | Status: AC
  Filled 2019-03-12: qty 2

## 2019-03-12 MED ORDER — SODIUM CHLORIDE 0.9% FLUSH
10.0000 mL | INTRAVENOUS | Status: DC | PRN
  Administered 2019-03-12: 10 mL

## 2019-03-12 MED ORDER — SODIUM CHLORIDE 0.9 % IV SOLN
510.0000 mg | Freq: Once | INTRAVENOUS | Status: AC
  Administered 2019-03-12: 510 mg via INTRAVENOUS
  Filled 2019-03-12: qty 510

## 2019-03-12 MED ORDER — SODIUM CHLORIDE 0.9 % IV SOLN: INTRAVENOUS | Status: DC

## 2019-03-12 NOTE — Progress Notes (Signed)
Patient tolerated iron infusion with no complaints voiced.  Peripheral IV site clean and dry with good blood return noted before and after infusion.  Band aid applied.  VSS with discharge and left ambulatory with no s/s of distress noted.  

## 2019-04-03 ENCOUNTER — Inpatient Hospital Stay (HOSPITAL_COMMUNITY): Payer: Medicaid Other | Attending: Hematology

## 2019-04-03 ENCOUNTER — Other Ambulatory Visit: Payer: Self-pay

## 2019-04-03 DIAGNOSIS — J449 Chronic obstructive pulmonary disease, unspecified: Secondary | ICD-10-CM | POA: Insufficient documentation

## 2019-04-03 DIAGNOSIS — N183 Chronic kidney disease, stage 3 unspecified: Secondary | ICD-10-CM | POA: Insufficient documentation

## 2019-04-03 DIAGNOSIS — Z79899 Other long term (current) drug therapy: Secondary | ICD-10-CM | POA: Insufficient documentation

## 2019-04-03 DIAGNOSIS — Z9079 Acquired absence of other genital organ(s): Secondary | ICD-10-CM | POA: Insufficient documentation

## 2019-04-03 DIAGNOSIS — I10 Essential (primary) hypertension: Secondary | ICD-10-CM | POA: Insufficient documentation

## 2019-04-03 DIAGNOSIS — Z7982 Long term (current) use of aspirin: Secondary | ICD-10-CM | POA: Diagnosis not present

## 2019-04-03 DIAGNOSIS — F17210 Nicotine dependence, cigarettes, uncomplicated: Secondary | ICD-10-CM | POA: Diagnosis not present

## 2019-04-03 DIAGNOSIS — Z85828 Personal history of other malignant neoplasm of skin: Secondary | ICD-10-CM | POA: Insufficient documentation

## 2019-04-03 DIAGNOSIS — D509 Iron deficiency anemia, unspecified: Secondary | ICD-10-CM | POA: Insufficient documentation

## 2019-04-03 DIAGNOSIS — D500 Iron deficiency anemia secondary to blood loss (chronic): Secondary | ICD-10-CM

## 2019-04-03 LAB — CBC WITH DIFFERENTIAL/PLATELET
Abs Immature Granulocytes: 0.02 10*3/uL (ref 0.00–0.07)
Basophils Absolute: 0.1 10*3/uL (ref 0.0–0.1)
Basophils Relative: 1 %
Eosinophils Absolute: 0.3 10*3/uL (ref 0.0–0.5)
Eosinophils Relative: 4 %
HCT: 34.2 % — ABNORMAL LOW (ref 39.0–52.0)
Hemoglobin: 9.8 g/dL — ABNORMAL LOW (ref 13.0–17.0)
Immature Granulocytes: 0 %
Lymphocytes Relative: 29 %
Lymphs Abs: 2.5 10*3/uL (ref 0.7–4.0)
MCH: 22.5 pg — ABNORMAL LOW (ref 26.0–34.0)
MCHC: 28.7 g/dL — ABNORMAL LOW (ref 30.0–36.0)
MCV: 78.4 fL — ABNORMAL LOW (ref 80.0–100.0)
Monocytes Absolute: 0.8 10*3/uL (ref 0.1–1.0)
Monocytes Relative: 9 %
Neutro Abs: 5.1 10*3/uL (ref 1.7–7.7)
Neutrophils Relative %: 57 %
Platelets: 350 10*3/uL (ref 150–400)
RBC: 4.36 MIL/uL (ref 4.22–5.81)
WBC: 8.7 10*3/uL (ref 4.0–10.5)
nRBC: 0 % (ref 0.0–0.2)

## 2019-04-03 LAB — IRON AND TIBC
Iron: 21 ug/dL — ABNORMAL LOW (ref 45–182)
Saturation Ratios: 5 % — ABNORMAL LOW (ref 17.9–39.5)
TIBC: 442 ug/dL (ref 250–450)
UIBC: 421 ug/dL

## 2019-04-03 LAB — COMPREHENSIVE METABOLIC PANEL
ALT: 24 U/L (ref 0–44)
AST: 23 U/L (ref 15–41)
Albumin: 3.5 g/dL (ref 3.5–5.0)
Alkaline Phosphatase: 138 U/L — ABNORMAL HIGH (ref 38–126)
Anion gap: 8 (ref 5–15)
BUN: 17 mg/dL (ref 8–23)
CO2: 29 mmol/L (ref 22–32)
Calcium: 9.1 mg/dL (ref 8.9–10.3)
Chloride: 105 mmol/L (ref 98–111)
Creatinine, Ser: 1.2 mg/dL (ref 0.61–1.24)
GFR calc Af Amer: >60 mL/min (ref >60)
GFR calc non Af Amer: >60 mL/min (ref >60)
Glucose, Bld: 126 mg/dL — ABNORMAL HIGH (ref 70–99)
Potassium: 4 mmol/L (ref 3.5–5.1)
Sodium: 142 mmol/L (ref 135–145)
Total Bilirubin: 0.5 mg/dL (ref 0.3–1.2)
Total Protein: 7.3 g/dL (ref 6.5–8.1)

## 2019-04-03 LAB — LACTATE DEHYDROGENASE: LDH: 105 U/L (ref 98–192)

## 2019-04-03 LAB — VITAMIN B12: Vitamin B-12: 506 pg/mL (ref 180–914)

## 2019-04-03 LAB — VITAMIN D 25 HYDROXY (VIT D DEFICIENCY, FRACTURES): Vit D, 25-Hydroxy: 17.92 ng/mL — ABNORMAL LOW (ref 30–100)

## 2019-04-03 LAB — FERRITIN: Ferritin: 23 ng/mL — ABNORMAL LOW (ref 24–336)

## 2019-04-03 LAB — FOLATE: Folate: 8.2 ng/mL (ref >5.9)

## 2019-04-04 ENCOUNTER — Other Ambulatory Visit: Payer: Self-pay

## 2019-04-04 ENCOUNTER — Inpatient Hospital Stay (HOSPITAL_BASED_OUTPATIENT_CLINIC_OR_DEPARTMENT_OTHER): Payer: Medicaid Other | Admitting: Nurse Practitioner

## 2019-04-04 DIAGNOSIS — D508 Other iron deficiency anemias: Secondary | ICD-10-CM | POA: Diagnosis not present

## 2019-04-04 DIAGNOSIS — D500 Iron deficiency anemia secondary to blood loss (chronic): Secondary | ICD-10-CM | POA: Diagnosis not present

## 2019-04-04 DIAGNOSIS — D509 Iron deficiency anemia, unspecified: Secondary | ICD-10-CM | POA: Diagnosis not present

## 2019-04-04 DIAGNOSIS — E559 Vitamin D deficiency, unspecified: Secondary | ICD-10-CM

## 2019-04-04 MED ORDER — ERGOCALCIFEROL 1.25 MG (50000 UT) PO CAPS: 50000.0000 [IU] | ORAL_CAPSULE | ORAL | 3 refills | Status: DC

## 2019-04-04 NOTE — Progress Notes (Signed)
[place], [place] [address]   CLINIC:  Medical Oncology/Hematology  PCP:  [name], NP [address] [place] [address] [phone]   REASON FOR VISIT: Follow-up for iron deficiency anemia  CURRENT THERAPY: Intermittent iron infusions   INTERVAL HISTORY:  Michael [name] 64 y.o. male was called for a telephone interview for iron deficiency anemia.  Patient reports he feels somewhat better after his 2 iron infusions.  He is still mildly short of breath and still fatigued.  He denies any bright red bleeding per rectum or melena.  He denies any easy bruising or bleeding. Denies any nausea, vomiting, or diarrhea. Denies any new pains. Had not noticed any recent bleeding such as epistaxis, hematuria or hematochezia. Denies recent chest pain on exertion, shortness of breath on minimal exertion, pre-syncopal episodes, or palpitations. Denies any numbness or tingling in hands or feet. Denies any recent fevers, infections, or recent hospitalizations. Patient reports appetite at 100% and energy level at 50%.  He is eating well maintain his weight this time.     REVIEW OF SYSTEMS:  Review of Systems  Constitutional: Positive for fatigue.  All other systems reviewed and are negative.    PAST MEDICAL/SURGICAL HISTORY:  Past Medical History:  Diagnosis Date  . Carotid artery disease ([place])   . Chronic diastolic heart failure ([place])   . CKD (chronic kidney disease) stage 3, GFR 30-59 ml/min   . COPD (chronic obstructive pulmonary disease) ([place])   . Coronary artery disease   . Essential hypertension   . Head and neck cancer ([place]) 2019   Right facial basal cell carcinoma s/p resection with right partial mastectomy and partal rhinectomy with skin graft (06/13/17)  . Iron deficiency anemia   . Urinary retention    Past Surgical History:  Procedure Laterality Date  . ANKLE CLOSED REDUCTION Right    open reduction  . BASAL CELL CARCINOMA  EXCISION  2019   at [facility]  . COLONOSCOPY N/A 05/31/2017   Procedure: COLONOSCOPY;  Surgeon: Daneil Dolin, MD;  Location: [facility] END[facility] SUITE;  Service: Endoscopy;  Laterality: N/A;  2:45pm  . CYSTOSCOPY N/A 10/29/2018   Procedure: Jilda Panda;  Surgeon: Ceasar Mons, MD;  Location: [facility] ORS;  Service: Urology;  Laterality: N/A;  . POLYPECTOMY  05/31/2017   Procedure: POLYPECTOMY;  Surgeon: Daneil Dolin, MD;  Location: [facility];  Service: Endoscopy;;  . TONSILLECTOMY    . XI ROBOTIC ASSISTED SIMPLE PROSTATECTOMY N/A 10/29/2018   Procedure: XI ROBOTIC ASSISTED SIMPLE PROSTATECTOMY;  Surgeon: Cleon Gustin, MD;  Location: [facility] ORS;  Service: Urology;  Laterality: N/A;     SOCIAL HISTORY:  Social History   Socioeconomic History  . Marital status: Married    Spouse name: Not on file  . Number of children: Not on file  . Years of education: Not on file  . Highest education level: Not on file  Occupational History  . Not on file  Tobacco Use  . Smoking status: Current Every Day Smoker    Packs/day: 0.50    Years: 40.00    Pack years: 20.00    Types: Cigarettes  . Smokeless tobacco: Never Used  Substance and Sexual Activity  . Alcohol use: Not Currently  . Drug use: Never  . Sexual activity: Not on file  Other Topics Concern  . Not on file  Social History Narrative  . Not on file   Social  Determinants of Health   Financial Resource Strain:   . Difficulty of Paying Living Expenses: Not on file  Food Insecurity:   . Worried About Charity fundraiser in the Last Year: Not on file  . Ran Out of Food in the Last Year: Not on file  Transportation Needs:   . Lack of Transportation (Medical): Not on file  . Lack of Transportation (Non-Medical): Not on file  Physical Activity:   . Days of Exercise per Week: Not on file  . Minutes of Exercise per Session: Not on file  Stress:   . Feeling of Stress : Not on file  Social Connections:   . Frequency of  Communication with Friends and Family: Not on file  . Frequency of Social Gatherings with Friends and Family: Not on file  . Attends Religious Services: Not on file  . Active Member of Clubs or Organizations: Not on file  . Attends Archivist Meetings: Not on file  . Marital Status: Not on file  Intimate Partner Violence:   . Fear of Current or Ex-Partner: Not on file  . Emotionally Abused: Not on file  . Physically Abused: Not on file  . Sexually Abused: Not on file    FAMILY HISTORY:  Family History  Problem Relation Age of Onset  . Stroke Father   . Cirrhosis Mother   . Colon cancer Neg Hx     CURRENT MEDICATIONS:  Outpatient Encounter Medications as of 04/04/2019  Medication Sig  . aspirin EC 81 MG tablet Take 81 mg by mouth daily.  Marland Kitchen atorvastatin (LIPITOR) 40 MG tablet Take 1 tablet (40 mg total) by mouth every evening.  . ergocalciferol (VITAMIN D2) 1.25 MG (50000 UT) capsule Take 1 capsule (50,000 Units total) by mouth once a week.  . furosemide (LASIX) 40 MG tablet Take 1 tablet (40 mg total) by mouth every evening.  . isosorbide mononitrate (IMDUR) 30 MG 24 hr tablet Take 1 tablet (30 mg total) by mouth every evening.  Marland Kitchen lisinopril (PRINIVIL,ZESTRIL) 5 MG tablet Take 1 tablet (5 mg total) by mouth daily for 30 days. (Patient taking differently: Take 5 mg by mouth every evening. )  . metoprolol succinate (TOPROL-XL) 25 MG 24 hr tablet Take 1 tablet (25 mg total) by mouth every evening.  . nitroGLYCERIN (NITROSTAT) 0.4 MG SL tablet Place 1 tablet (0.4 mg total) under the tongue every 5 (five) minutes as needed for chest pain. (Patient not taking: Reported on 03/01/2019)  . oxybutynin (DITROPAN) 5 MG tablet Take 1 tablet (5 mg total) by mouth 3 (three) times daily.   No facility-administered encounter medications on file as of 04/04/2019.    ALLERGIES:  No Known Allergies   Vital signs: -Deferred due to telephone visit  Physical Exam -Deferred due to telephone  visit -Patient was alert and oriented over the phone and in no acute distress.   LABORATORY DATA:  I have reviewed the labs as listed.  CBC    Component Value Date/Time   WBC 8.7 04/03/2019 1303   RBC 4.36 04/03/2019 1303   HGB 9.8 (L) 04/03/2019 1303   HGB 13.8 04/23/2011 0919   HCT 34.2 (L) 04/03/2019 1303   HCT 26.0 (L) 05/09/2018 0455   PLT 350 04/03/2019 1303   PLT 208 04/23/2011 0919   MCV 78.4 (L) 04/03/2019 1303   MCV 84 04/23/2011 0919   MCH 22.5 (L) 04/03/2019 1303   MCHC 28.7 (L) 04/03/2019 1303   RDW Not Measured 04/03/2019  1303   RDW 12.3 04/23/2011 0919   LYMPHSABS 2.5 04/03/2019 1303   LYMPHSABS 1.1 04/23/2011 0919   MONOABS 0.8 04/03/2019 1303   MONOABS 0.9 (H) 04/23/2011 0919   EOSABS 0.3 04/03/2019 1303   EOSABS 0.0 04/23/2011 0919   BASOSABS 0.1 04/03/2019 1303   BASOSABS 0.0 04/23/2011 0919   CMP Latest Ref Rng & Units 04/03/2019 03/01/2019 11/01/2018  Glucose 70 - 99 mg/dL 126(H) 109(H) 107(H)  BUN 8 - 23 mg/dL 17 21 17   Creatinine 0.61 - 1.24 mg/dL 1.20 1.24 1.07  Sodium 135 - 145 mmol/L 142 142 138  Potassium 3.5 - 5.1 mmol/L 4.0 3.6 2.9(L)  Chloride 98 - 111 mmol/L 105 105 107  CO2 22 - 32 mmol/L 29 27 23   Calcium 8.9 - 10.3 mg/dL 9.1 9.1 7.9(L)  Total Protein 6.5 - 8.1 g/dL 7.3 7.7 -  Total Bilirubin 0.3 - 1.2 mg/dL 0.5 0.4 -  Alkaline Phos 38 - 126 U/L 138(H) 132(H) -  AST 15 - 41 U/L 23 21 -  ALT 0 - 44 U/L 24 18 -     I personally performed a face-to-face visit.  All questions were answered to patient's stated satisfaction. Encouraged patient to call with any new concerns or questions before his next visit to the cancer center and we can certain see him sooner, if needed.     ASSESSMENT & PLAN:   Iron (Fe) deficiency anemia 1.  Iron deficiency anemia: -Likely due to CKD. -Last Feraheme infusion was on 03/23/2019 and 2/92021 -He denies any bright red bleeding per rectum or melena. -Labs done on 03/01/2019 showed his hemoglobin 7.7,  ferritin 3, percent saturation 3 -Patient is able to walk around without shortness of breath or extreme fatigue or weakness. -Labs done on 04/03/2019 showed hemoglobin has improved to 9.8, ferritin 23 and percent saturation 5 -We will set him up with 2 more iron infusions 1 week apart.   -He will follow up in 8 weeks with repeat labs.  2.  Locally advanced basal cell carcinoma of the right nasolabial fold: -Biopsy of the right nasal mucosa on 04/05/2017 showed basal cell carcinoma. -Biopsy of the left cheek lesion on 04/02/2017 was consistent with basal cell carcinoma. -He was initially evaluated by Dr. Blenda Nicely at [facility] and then was referred to Dr. Mardee Postin at [facility]. -He underwent resection with skin graft on 06/06/2017.  The margins was close.  Rest of the margins were negative.  He was presented at tumor board at [facility].  Recommendation was not to give any adjuvant therapy.  Overall he is very happy with the results. -He follows up with his doctors at [facility].  3.  Uncontrolled hypertension: -Patient started back on his Coreg 12.5 mg twice daily.  His blood pressure is still elevated at 194/95. -She will also continue Lipitor 40 mg daily and aspirin 81 mg daily. -He is following up with his PCP.  PCP aware of high blood pressure saw him a week and a half ago.      Orders placed this encounter:  Orders Placed This Encounter  Procedures  . Lactate dehydrogenase  . CBC with Differential/Platelet  . Comprehensive metabolic panel  . Ferritin  . Iron and TIBC  . Vitamin B12  . VITAMIN D 25 Hydroxy (Vit-D Deficiency, Fractures)    I provided 22 minutes of non face-to-face telephone visit time during this encounter, and > 50% was spent counseling as documented under my assessment & plan.  Francene Finders, FNP-C  [place] [phone]

## 2019-04-04 NOTE — Assessment & Plan Note (Signed)
1.  Iron deficiency anemia: -Likely due to CKD. -Last Feraheme infusion was on 03/23/2019 and 2/92021 -He denies any bright red bleeding per rectum or melena. -Labs done on 03/01/2019 showed his hemoglobin 7.7, ferritin 3, percent saturation 3 -Patient is able to walk around without shortness of breath or extreme fatigue or weakness. -Labs done on 04/03/2019 showed hemoglobin has improved to 9.8, ferritin 23 and percent saturation 5 -We will set him up with 2 more iron infusions 1 week apart.   -He will follow up in 8 weeks with repeat labs.  2.  Locally advanced basal cell carcinoma of the right nasolabial fold: -Biopsy of the right nasal mucosa on 04/05/2017 showed basal cell carcinoma. -Biopsy of the left cheek lesion on 04/02/2017 was consistent with basal cell carcinoma. -He was initially evaluated by Dr. Blenda Nicely at [facility] and then was referred to Dr. Mardee Postin at [facility]. -He underwent resection with skin graft on 06/06/2017.  The margins was close.  Rest of the margins were negative.  He was presented at tumor board at [facility].  Recommendation was not to give any adjuvant therapy.  Overall he is very happy with the results. -He follows up with his doctors at [facility].  3.  Uncontrolled hypertension: -Patient started back on his Coreg 12.5 mg twice daily.  His blood pressure is still elevated at 194/95. -She will also continue Lipitor 40 mg daily and aspirin 81 mg daily. -He is following up with his PCP.  PCP aware of high blood pressure saw him a week and a half ago.

## 2019-04-04 NOTE — Patient Instructions (Signed)
[facility] at [facility] Discharge Instructions  Follow up in 2 months with labs    Thank you for choosing [facility] at [facility] to provide your oncology and hematology care.  To afford each patient quality time with our provider, please arrive at least 15 minutes before your scheduled appointment time.   If you have a lab appointment with the Cancer Center please come in thru the Main Entrance and check in at the main information desk.  You need to re-schedule your appointment should you arrive 10 or more minutes late.  We strive to give you quality time with our providers, and arriving late affects you and other patients whose appointments are after yours.  Also, if you no show three or more times for appointments you may be dismissed from the clinic at the providers discretion.     Again, thank you for choosing [facility].  Our hope is that these requests will decrease the amount of time that you wait before being seen by our physicians.       _____________________________________________________________  Should you have questions after your visit to  Cancer Center, please contact our office at [phone] between the hours of 8:00 a.m. and 4:30 p.m.  Voicemails left after 4:00 p.m. will not be returned until the following business day.  For prescription refill requests, have your pharmacy contact our office and allow 72 hours.    Due to Covid, you will need to wear a mask upon entering the hospital. If you do not have a mask, a mask will be given to you at the Main Entrance upon arrival. For doctor visits, patients may have 1 support person with them. For treatment visits, patients can not have anyone with them due to social distancing guidelines and our immunocompromised population.      

## 2019-04-11 ENCOUNTER — Inpatient Hospital Stay (HOSPITAL_COMMUNITY): Payer: Medicaid Other

## 2019-04-11 ENCOUNTER — Other Ambulatory Visit: Payer: Self-pay

## 2019-04-11 ENCOUNTER — Encounter (HOSPITAL_COMMUNITY): Payer: Self-pay

## 2019-04-11 VITALS — BP 139/76 | HR 74 | Temp 97.7°F | Resp 18

## 2019-04-11 DIAGNOSIS — D500 Iron deficiency anemia secondary to blood loss (chronic): Secondary | ICD-10-CM

## 2019-04-11 DIAGNOSIS — D509 Iron deficiency anemia, unspecified: Secondary | ICD-10-CM | POA: Diagnosis not present

## 2019-04-11 MED ORDER — SODIUM CHLORIDE 0.9 % IV SOLN: Freq: Once | INTRAVENOUS | Status: AC

## 2019-04-11 MED ORDER — SODIUM CHLORIDE 0.9 % IV SOLN
510.0000 mg | Freq: Once | INTRAVENOUS | Status: AC
  Administered 2019-04-11: 510 mg via INTRAVENOUS
  Filled 2019-04-11: qty 510

## 2019-04-11 NOTE — Progress Notes (Signed)
Iron given per orders. Patient tolerated it well without problems. Vitals stable and discharged home from clinic ambulatory. Follow up as scheduled.  

## 2019-04-18 ENCOUNTER — Ambulatory Visit (HOSPITAL_COMMUNITY): Payer: Medicaid Other

## 2019-04-19 ENCOUNTER — Inpatient Hospital Stay (HOSPITAL_COMMUNITY): Payer: Medicaid Other

## 2019-04-19 ENCOUNTER — Encounter (HOSPITAL_COMMUNITY): Payer: Self-pay

## 2019-04-19 ENCOUNTER — Other Ambulatory Visit: Payer: Self-pay

## 2019-04-19 VITALS — BP 159/79 | HR 68 | Temp 97.5°F | Resp 18

## 2019-04-19 DIAGNOSIS — D509 Iron deficiency anemia, unspecified: Secondary | ICD-10-CM | POA: Diagnosis not present

## 2019-04-19 DIAGNOSIS — D500 Iron deficiency anemia secondary to blood loss (chronic): Secondary | ICD-10-CM

## 2019-04-19 MED ORDER — SODIUM CHLORIDE 0.9 % IV SOLN: Freq: Once | INTRAVENOUS | Status: AC

## 2019-04-19 MED ORDER — SODIUM CHLORIDE 0.9 % IV SOLN
510.0000 mg | Freq: Once | INTRAVENOUS | Status: AC
  Administered 2019-04-19: 510 mg via INTRAVENOUS
  Filled 2019-04-19: qty 510

## 2019-04-19 NOTE — Patient Instructions (Signed)
[facility] at [facility] Discharge Instructions  Received Feraheme infusion today. Follow-up as scheduled. Call clinic for any questions or concerns   Thank you for choosing [facility] at [facility] to provide your oncology and hematology care.  To afford each patient quality time with our provider, please arrive at least 15 minutes before your scheduled appointment time.   If you have a lab appointment with the Cancer Center please come in thru the Main Entrance and check in at the main information desk.  You need to re-schedule your appointment should you arrive 10 or more minutes late.  We strive to give you quality time with our providers, and arriving late affects you and other patients whose appointments are after yours.  Also, if you no show three or more times for appointments you may be dismissed from the clinic at the providers discretion.     Again, thank you for choosing [facility].  Our hope is that these requests will decrease the amount of time that you wait before being seen by our physicians.       _____________________________________________________________  Should you have questions after your visit to [facility], please contact our office at [phone] between the hours of 8:00 a.m. and 4:30 p.m.  Voicemails left after 4:00 p.m. will not be returned until the following business day.  For prescription refill requests, have your pharmacy contact our office and allow 72 hours.    Due to Covid, you will need to wear a mask upon entering the hospital. If you do not have a mask, a mask will be given to you at the Main Entrance upon arrival. For doctor visits, patients may have 1 support person with them. For treatment visits, patients can not have anyone with them due to social distancing guidelines and our immunocompromised population.     

## 2019-04-19 NOTE — Progress Notes (Signed)
Volney Kapral tolerated Feraheme infusion well without complaints or incident. Peripheral IV site checked with positive blood return noted prior to and after infusion. VSS upon discharge. Pt discharged self ambulatory in satisfactory condition 

## 2019-06-06 ENCOUNTER — Other Ambulatory Visit: Payer: Self-pay

## 2019-06-06 ENCOUNTER — Inpatient Hospital Stay (HOSPITAL_COMMUNITY): Payer: Medicare Other | Attending: Hematology

## 2019-06-06 DIAGNOSIS — Z9079 Acquired absence of other genital organ(s): Secondary | ICD-10-CM | POA: Diagnosis not present

## 2019-06-06 DIAGNOSIS — Z85828 Personal history of other malignant neoplasm of skin: Secondary | ICD-10-CM | POA: Diagnosis not present

## 2019-06-06 DIAGNOSIS — N183 Chronic kidney disease, stage 3 unspecified: Secondary | ICD-10-CM | POA: Diagnosis not present

## 2019-06-06 DIAGNOSIS — J441 Chronic obstructive pulmonary disease with (acute) exacerbation: Secondary | ICD-10-CM | POA: Diagnosis not present

## 2019-06-06 DIAGNOSIS — D509 Iron deficiency anemia, unspecified: Secondary | ICD-10-CM | POA: Insufficient documentation

## 2019-06-06 DIAGNOSIS — F17210 Nicotine dependence, cigarettes, uncomplicated: Secondary | ICD-10-CM | POA: Insufficient documentation

## 2019-06-06 DIAGNOSIS — Z79899 Other long term (current) drug therapy: Secondary | ICD-10-CM | POA: Insufficient documentation

## 2019-06-06 DIAGNOSIS — I130 Hypertensive heart and chronic kidney disease with heart failure and stage 1 through stage 4 chronic kidney disease, or unspecified chronic kidney disease: Secondary | ICD-10-CM | POA: Insufficient documentation

## 2019-06-06 DIAGNOSIS — D500 Iron deficiency anemia secondary to blood loss (chronic): Secondary | ICD-10-CM

## 2019-06-06 LAB — VITAMIN B12: Vitamin B-12: 1042 pg/mL — ABNORMAL HIGH (ref 180–914)

## 2019-06-06 LAB — VITAMIN D 25 HYDROXY (VIT D DEFICIENCY, FRACTURES): Vit D, 25-Hydroxy: 50.66 ng/mL (ref 30–100)

## 2019-06-06 LAB — LACTATE DEHYDROGENASE: LDH: 94 U/L — ABNORMAL LOW (ref 98–192)

## 2019-06-11 ENCOUNTER — Inpatient Hospital Stay (HOSPITAL_BASED_OUTPATIENT_CLINIC_OR_DEPARTMENT_OTHER): Payer: Medicare Other | Admitting: Nurse Practitioner

## 2019-06-11 ENCOUNTER — Other Ambulatory Visit: Payer: Self-pay

## 2019-06-11 DIAGNOSIS — D508 Other iron deficiency anemias: Secondary | ICD-10-CM | POA: Diagnosis not present

## 2019-06-11 NOTE — Assessment & Plan Note (Signed)
1.  Iron deficiency anemia: -Likely due to CKD. -Last Feraheme infusion was on 04/11/2019 and 04/19/2019 -He denies any bright red bleeding per rectum or melena. -Labs done on 03/01/2019 showed his hemoglobin 7.7, ferritin 3, percent saturation 3 -Patient is able to walk around without shortness of breath or extreme fatigue or weakness. -Labs done on 04/03/2019 showed hemoglobin has improved to 9.8, ferritin 23 and percent saturation 5.  He received 2 more iron infusions after. -Labs done on 06/06/2019 showed vitamin D level has improved to 50.66 and vitamin B12 is 1042.  CBC was missed at this visit. -Patient reports he does feel slightly better after his iron infusions. -He will follow up in 4 weeks with repeat labs.  2.  Locally advanced basal cell carcinoma of the right nasolabial fold: -Biopsy of the right nasal mucosa on 04/05/2017 showed basal cell carcinoma. -Biopsy of the left cheek lesion on 04/02/2017 was consistent with basal cell carcinoma. -He was initially evaluated by Dr. Blenda Nicely at [facility] and then was referred to Dr. Mardee Postin at [facility]. -He underwent resection with skin graft on 06/06/2017.  The margins was close.  Rest of the margins were negative.  He was presented at tumor board at [facility].  Recommendation was not to give any adjuvant therapy.  Overall he is very happy with the results. -He follows up with his doctors at [facility].  3.  Uncontrolled hypertension: -Patient started back on his Coreg 12.5 mg twice daily.   -She will also continue Lipitor 40 mg daily and aspirin 81 mg daily. -He is following up with his PCP.  PCP aware of high blood pressure saw him a week and a half ago.

## 2019-06-11 NOTE — Progress Notes (Signed)
[facility] up:    [name], NP [address] [place] [address]   DIAGNOSIS: Iron deficiency anemia  CURRENT THERAPY: Intermittent iron infusions  INTERVAL HISTORY: Michael [name] 64 y.o. male returns for iron deficiency anemia.  Patient works is still fatigued.  The iron infusions helped slightly but he still feels tired.  He does report darker stools.  He denies any bright red bleeding per rectum. Denies any nausea, vomiting, or diarrhea. Denies any new pains. Had not noticed any recent bleeding such as epistaxis, hematuria or hematochezia. Denies recent chest pain on exertion, shortness of breath on minimal exertion, pre-syncopal episodes, or palpitations. Denies any numbness or tingling in hands or feet. Denies any recent fevers, infections, or recent hospitalizations. Patient reports appetite at 75% and energy level at 25%.    Patient Active Problem List   Diagnosis Date Noted  . BPH with obstruction/lower urinary tract symptoms 10/29/2018  . CAP (community acquired pneumonia) 05/09/2018  . Hypertensive emergency 05/09/2018  . Acute on chronic combined systolic and diastolic CHF (congestive heart failure) ([place]) 05/09/2018  . Lobar pneumonia ([place]) 05/09/2018  . Acute on chronic combined systolic and diastolic congestive heart failure ([place]) 05/09/2018  . Urinary retention 11/27/2017  . Abnormal nuclear stress test 11/22/2017  . Complicated UTI (urinary tract infection)   . Sepsis ([place]) 09/17/2017  . Vasovagal syncope 09/17/2017  . Sepsis secondary to UTI ([place]) 09/01/2017  . Syncope 07/05/2017  . COPD exacerbation ([place]) 07/05/2017  . Acute renal failure superimposed on chronic kidney disease ([place]) 07/05/2017  . Catheter-associated urinary tract infection ([place]) 07/05/2017  . Leukocytosis 07/05/2017  . Urinary retention with incomplete bladder emptying 07/05/2017  . UTI (urinary tract infection) 07/05/2017  . Diarrhea 07/05/2017  .  Carotid artery disease ([place]) 06/25/2017  . AKI (acute kidney injury) ([place])   . Orthostatic syncope   . Syncope due to orthostatic hypotension 06/24/2017  . Heart murmur 06/24/2017  . Abnormal EKG 06/24/2017  . Abnormal PET scan of colon 05/25/2017  . 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 Periapical abscess 04/04/2017  . Iron (Fe) deficiency anemia 04/04/2017  . Essential hypertension 04/02/2017  . Anemia 04/02/2017  . Acute hypoxemic respiratory failure ([place]) 04/02/2017  . Tobacco abuse 04/02/2017  . Chronic diastolic CHF (congestive heart failure) ([place]) 04/02/2017  . Elevated troponin 04/02/2017  . Thrombocytosis ([place]) 04/02/2017  . Skin lesion of face   . Nasal lesion     has No Known Allergies.  MEDICAL HISTORY: Past Medical History:  Diagnosis Date  . Carotid artery disease ([place])   . Chronic diastolic heart failure ([place])   . CKD (chronic kidney disease) stage 3, GFR 30-59 ml/min   . COPD (chronic obstructive pulmonary disease) ([place])   . Coronary artery disease   . Essential hypertension   . Head and neck cancer ([place]) 2019   Right facial basal cell carcinoma s/p resection with right partial mastectomy and partal rhinectomy with skin graft (06/13/17)  . Iron deficiency anemia   . Urinary retention     SURGICAL HISTORY: Past Surgical History:  Procedure Laterality Date  . ANKLE CLOSED REDUCTION Right    open reduction  . BASAL CELL CARCINOMA EXCISION  2019   at [facility]  . COLONOSCOPY N/A 05/31/2017   Procedure: COLONOSCOPY;  Surgeon: Daneil Dolin, MD;  Location[facility];  Service: Endoscopy;  Laterality: N/A;  2:45pm  . CYSTOSCOPY N/A 10/29/2018  Procedure: CYSTOSCOPY, CLOT EVACUATION;  Surgeon: Ceasar Mons, MD;  Location: [facility];  Service: Urology;  Laterality: N/A;  . POLYPECTOMY  05/31/2017   Procedure: POLYPECTOMY;  Surgeon: Daneil Dolin, MD;  Location: [facility];  Service: Endoscopy;;  . TONSILLECTOMY    . XI  ROBOTIC ASSISTED SIMPLE PROSTATECTOMY N/A 10/29/2018   Procedure: XI ROBOTIC ASSISTED SIMPLE PROSTATECTOMY;  Surgeon: Cleon Gustin, MD;  Location: [facility];  Service: Urology;  Laterality: N/A;    SOCIAL HISTORY: Social History   Socioeconomic History  . Marital status: Married    Spouse name: Not on file  . Number of children: Not on file  . Years of education: Not on file  . Highest education level: Not on file  Occupational History  . Not on file  Tobacco Use  . Smoking status: Current Every Day Smoker    Packs/day: 0.50    Years: 40.00    Pack years: 20.00    Types: Cigarettes  . Smokeless tobacco: Never Used  Substance and Sexual Activity  . Alcohol use: Not Currently  . Drug use: Never  . Sexual activity: Not on file  Other Topics Concern  . Not on file  Social History Narrative  . Not on file   Social Determinants of Health   Financial Resource Strain:   . Difficulty of Paying Living Expenses:   Food Insecurity:   . Worried About Charity fundraiser in the Last Year:   . Arboriculturist in the Last Year:   Transportation Needs:   . Film/video editor (Medical):   Marland Kitchen Lack of Transportation (Non-Medical):   Physical Activity:   . Days of Exercise per Week:   . Minutes of Exercise per Session:   Stress:   . Feeling of Stress :   Social Connections:   . Frequency of Communication with Friends and Family:   . Frequency of Social Gatherings with Friends and Family:   . Attends Religious Services:   . Active Member of Clubs or Organizations:   . Attends Archivist Meetings:   Marland Kitchen Marital Status:   Intimate Partner Violence:   . Fear of Current or Ex-Partner:   . Emotionally Abused:   Marland Kitchen Physically Abused:   . Sexually Abused:     FAMILY HISTORY: Family History  Problem Relation Age of Onset  . Stroke Father   . Cirrhosis Mother   . Colon cancer Neg Hx     Review of Systems  Constitutional: Positive for fatigue.  Respiratory: Positive  for shortness of breath.      Labs: -Deferred due to telephone visit  Physical Exam -Deferred due to telephone visit -Patient was alert and oriented over the phone and in no acute distress   LABORATORY DATA:  CBC    Component Value Date/Time   WBC 8.7 04/03/2019 1303   RBC 4.36 04/03/2019 1303   HGB 9.8 (L) 04/03/2019 1303   HGB 13.8 04/23/2011 0919   HCT 34.2 (L) 04/03/2019 1303   HCT 26.0 (L) 05/09/2018 0455   PLT 350 04/03/2019 1303   PLT 208 04/23/2011 0919   MCV 78.4 (L) 04/03/2019 1303   MCV 84 04/23/2011 0919   MCH 22.5 (L) 04/03/2019 1303   MCHC 28.7 (L) 04/03/2019 1303   RDW Not Measured 04/03/2019 1303   RDW 12.3 04/23/2011 0919   LYMPHSABS 2.5 04/03/2019 1303   LYMPHSABS 1.1 04/23/2011 0919   MONOABS 0.8 04/03/2019 1303   MONOABS 0.9 (  H) 04/23/2011 0919   EOSABS 0.3 04/03/2019 1303   EOSABS 0.0 04/23/2011 0919   BASOSABS 0.1 04/03/2019 1303   BASOSABS 0.0 04/23/2011 0919    CMP     Component Value Date/Time   NA 142 04/03/2019 1303   NA 140 04/23/2011 0919   K 4.0 04/03/2019 1303   K 2.4 (LL) 04/23/2011 0919   CL 105 04/03/2019 1303   CL 100 04/23/2011 0919   CO2 29 04/03/2019 1303   CO2 28 04/23/2011 0919   GLUCOSE 126 (H) 04/03/2019 1303   GLUCOSE 101 (H) 04/23/2011 0919   BUN 17 04/03/2019 1303   BUN 17 04/23/2011 0919   CREATININE 1.20 04/03/2019 1303   CREATININE 1.18 04/23/2011 0919   CALCIUM 9.1 04/03/2019 1303   CALCIUM 8.4 (L) 04/23/2011 0919   PROT 7.3 04/03/2019 1303   PROT 7.3 04/23/2011 0919   ALBUMIN 3.5 04/03/2019 1303   ALBUMIN 3.2 (L) 04/23/2011 0919   AST 23 04/03/2019 1303   AST 64 (H) 04/23/2011 0919   ALT 24 04/03/2019 1303   ALT 70 04/23/2011 0919   ALKPHOS 138 (H) 04/03/2019 1303   ALKPHOS 62 04/23/2011 0919   BILITOT 0.5 04/03/2019 1303   BILITOT 1.0 04/23/2011 0919   GFRNONAA >60 04/03/2019 1303   GFRNONAA >60 04/23/2011 0919   GFRAA >60 04/03/2019 1303   GFRAA >60 04/23/2011 0919   All questions were  answered to patient's stated satisfaction. Encouraged patient to call with any new concerns or questions before his next visit to the cancer center and we can certain see him sooner, if needed.     ASSESSMENT and THERAPY PLAN:   Iron (Fe) deficiency anemia 1.  Iron deficiency anemia: -Likely due to CKD. -Last Feraheme infusion was on 04/11/2019 and 04/19/2019 -He denies any bright red bleeding per rectum or melena. -Labs done on 03/01/2019 showed his hemoglobin 7.7, ferritin 3, percent saturation 3 -Patient is able to walk around without shortness of breath or extreme fatigue or weakness. -Labs done on 04/03/2019 showed hemoglobin has improved to 9.8, ferritin 23 and percent saturation 5.  He received 2 more iron infusions after. -Labs done on 06/06/2019 showed vitamin D level has improved to 50.66 and vitamin B12 is 1042.  CBC was missed at this visit. -Patient reports he does feel slightly better after his iron infusions. -He will follow up in 4 weeks with repeat labs.  2.  Locally advanced basal cell carcinoma of the right nasolabial fold: -Biopsy of the right nasal mucosa on 04/05/2017 showed basal cell carcinoma. -Biopsy of the left cheek lesion on 04/02/2017 was consistent with basal cell carcinoma. -He was initially evaluated by Dr. Blenda Nicely at [facility] and then was referred to Dr. Mardee Postin at [facility]. -He underwent resection with skin graft on 06/06/2017.  The margins was close.  Rest of the margins were negative.  He was presented at tumor board at [facility].  Recommendation was not to give any adjuvant therapy.  Overall he is very happy with the results. -He follows up with his doctors at [facility]  3.  Uncontrolled hypertension: -Patient started back on his Coreg 12.5 mg twice daily.   -She will also continue Lipitor 40 mg daily and aspirin 81 mg daily. -He is following up with his PCP.  PCP aware of high blood pressure saw him a week and a half ago.   Orders Placed This Encounter   Procedures  . Lactate dehydrogenase    Standing Status:   Future  Standing Expiration Date:   06/10/2020  . CBC with Differential/Platelet    Standing Status:   Future    Standing Expiration Date:   06/10/2020  . Comprehensive metabolic panel    Standing Status:   Future    Standing Expiration Date:   06/10/2020  . Ferritin    Standing Status:   Future    Standing Expiration Date:   06/10/2020  . Iron and TIBC    Standing Status:   Future    Standing Expiration Date:   06/10/2020  . Vitamin B12    Standing Status:   Future    Standing Expiration Date:   06/10/2020  . VITAMIN D 25 Hydroxy (Vit-D Deficiency, Fractures)    Standing Status:   Future    Standing Expiration Date:   06/10/2020    All questions were answered. The patient knows to call the clinic with any problems, questions or concerns. We can certainly see the patient much sooner if necessary. This note was electronically signed.  I provided 30 minutes of non face-to-face telephone visit time during this encounter, and > 50% was spent counseling as documented under my assessment & plan.   Glennie Isle, NP-C 06/11/2019

## 2019-06-13 ENCOUNTER — Ambulatory Visit (HOSPITAL_COMMUNITY): Payer: Medicaid Other | Admitting: Nurse Practitioner

## 2019-06-19 ENCOUNTER — Other Ambulatory Visit: Payer: Self-pay | Admitting: Urology

## 2019-06-19 ENCOUNTER — Other Ambulatory Visit: Payer: Self-pay

## 2019-06-19 ENCOUNTER — Ambulatory Visit (INDEPENDENT_AMBULATORY_CARE_PROVIDER_SITE_OTHER): Payer: Medicare Other | Admitting: Urology

## 2019-06-19 ENCOUNTER — Encounter: Payer: Self-pay | Admitting: Urology

## 2019-06-19 VITALS — BP 196/71 | HR 75 | Temp 98.4°F | Ht 72.0 in | Wt 190.0 lb

## 2019-06-19 DIAGNOSIS — N401 Enlarged prostate with lower urinary tract symptoms: Secondary | ICD-10-CM

## 2019-06-19 DIAGNOSIS — N138 Other obstructive and reflux uropathy: Secondary | ICD-10-CM | POA: Diagnosis not present

## 2019-06-19 DIAGNOSIS — R339 Retention of urine, unspecified: Secondary | ICD-10-CM

## 2019-06-19 DIAGNOSIS — R972 Elevated prostate specific antigen [PSA]: Secondary | ICD-10-CM | POA: Diagnosis not present

## 2019-06-19 LAB — BLADDER SCAN AMB NON-IMAGING: Scan Result: 18.4

## 2019-06-19 NOTE — Patient Instructions (Signed)

## 2019-06-19 NOTE — Progress Notes (Signed)
06/19/2019 3:27 PM   Michael [name] 1955-02-13 [id number]  Referring provider: [name], NP [place],  [place] [address]  Urinary retention  HPI: Michael [name] is a 64yo here for followup for BPH with urinary retention. No issues since last visit. No hematuria, no UTI. Stream strong. NO frequency or urgency. Michael [name] emptying bladder well.  His records from [facility] are as follows: 06/22/18: Michael [name] returns today in f/u. He was seen a year ago for post op retention. He was getting foley changes in the ED since last year with the last visit in 12/19. He had some admissions for sepsis as well. The foley broke and has been out since about January. He is off finasteride. He is voiding well with an IPSS of 3. His UA today is LE and Heme + with a pH of 8. He has some dysuria since yesterday but it is intermittent. He has had no fever. He has had no GU surgery.   On review of outside records, he had a PSA of 29.9 in 5/19 and was to be scheduled for a prostate MRI and biopsy at [facility] but that doesn't appear to have been done. A PET CT in 3/19 showed a very large prostate that was about 274ml and he has multiple bladder stones with the largest 2.7cm. He had mild right hydronephrosis as well on the CT. He had a right renal mass and f/u with an MRI was recommended but that was never done.   06/30/17: Michael [name] presented to the ED on 5/27 with complaints of inability to void and catheter was placed. Output was not documented. UC was negative. He was discharged from the hospital the day prior due to issues with orthostatic hypotension. He is s/p recent excision and skin grafting for right nasal labial basal cell carcinoma, which was performed at [facility] about 2-3 weeks ago. He states that his catheter has been draining well. He does have some painful urgency with associated leakage. He denies any fevers. He states that prior to 5/27 he was not having any difficulties voiding. At baseline had voided  about every 4 hours during the daytime. He has nocturia X 2. He denies urgency. He feels his stream is adequate. He denies straining or intermittency of his stream. He denies issues with constipation. He denies past urological history. He does not have routine annual exam. He has never had PSA to his knowledge. No family history of GU malignancy. He did have renal ultrasound earlier this year in March 2019 and was noted to have a 1.6 x 1.2 x 1.2 cm hypoechoic right renal mass of indeterminate etiology. Appearance is likely to reflect a mildly complicated cyst. Further characterization with MRI of the abdomen was recommended.   07/11/2018: The Michael [name] has moderate LUTS with no BPH meds. He had a foley for 16 weeks which came out 1 month ago. No hematuria. He has a 2.7cm bladder calculus   08/01/2018: Michael [name] here for prostate biopsy   09/05/2018: He has moderate LUTS which is partially improved with alpha blocker therapy. He had a negative prostate biopsy 2 weeks ago.   11/13/2018: s/p robotic simple prostatectomy. no pain. incisions healing well. cystogram shows no leak   12/19/2018: stream strong. no hematuria. no pain.     PMH: Past Medical History:  Diagnosis Date  . Carotid artery disease ([place])   . Chronic diastolic heart failure ([place])   . CKD (chronic kidney disease) stage 3, GFR 30-59 ml/min   .  COPD (chronic obstructive pulmonary disease) ([place])   . Coronary artery disease   . Essential hypertension   . Head and neck cancer ([place]) 2019   Right facial basal cell carcinoma s/p resection with right partial mastectomy and partal rhinectomy with skin graft (06/13/17)  . Iron deficiency anemia   . Urinary retention     Surgical History: Past Surgical History:  Procedure Laterality Date  . ANKLE CLOSED REDUCTION Right    open reduction  . BASAL CELL CARCINOMA EXCISION  [facility]   [facility]  . COLONOSCOPY N/A 05/31/2017   Procedure: COLONOSCOPY;  Surgeon: Daneil Dolin, MD;  [facility];  Service: Endoscopy;  Laterality: N/A;  2:45pm  . CYSTOSCOPY N/A 10/29/2018   Procedure: Jilda Panda;  Surgeon: Ceasar Mons, MD;  [facility]: [facility];  Service: Urology;  Laterality: N/A;  . POLYPECTOMY  05/31/2017   Procedure: POLYPECTOMY;  Surgeon: Daneil Dolin, MD;  [facility];  Service: Endoscopy;;  . TONSILLECTOMY    . XI ROBOTIC ASSISTED SIMPLE PROSTATECTOMY N/A 10/29/2018   Procedure: XI ROBOTIC ASSISTED SIMPLE PROSTATECTOMY;  Surgeon: Cleon Gustin, MD;  [facility];  Service: Urology;  Laterality: N/A;    Home Medications:  Allergies as of 06/19/2019   No Known Allergies     Medication List       Accurate as of Jun 19, 2019  3:27 PM. If you have any questions, ask your nurse or doctor.        aspirin EC 81 MG tablet Take 81 mg by mouth daily.   atorvastatin 40 MG tablet Commonly known as: LIPITOR Take 1 tablet (40 mg total) by mouth every evening.   ergocalciferol 1.25 MG (50000 UT) capsule Commonly known as: VITAMIN D2 Take 1 capsule (50,000 Units total) by mouth once a week.   furosemide 40 MG tablet Commonly known as: LASIX Take 1 tablet (40 mg total) by mouth every evening.   isosorbide mononitrate 30 MG 24 hr tablet Commonly known as: IMDUR Take 1 tablet (30 mg total) by mouth every evening.   lisinopril 5 MG tablet Commonly known as: ZESTRIL Take 1 tablet (5 mg total) by mouth daily for 30 days. What changed: when to take this   metoprolol succinate 25 MG 24 hr tablet Commonly known as: TOPROL-XL Take 1 tablet (25 mg total) by mouth every evening.   nitroGLYCERIN 0.4 MG SL tablet Commonly known as: NITROSTAT Place 1 tablet (0.4 mg total) under the tongue every 5 (five) minutes as needed for chest pain.   oxybutynin 5 MG tablet Commonly known as: DITROPAN Take 1 tablet (5 mg total) by mouth 3 (three) times daily.       Allergies: No Known Allergies  Family History: Family History   Problem Relation Age of Onset  . Stroke Father   . Cirrhosis Mother   . Colon cancer Neg Hx     Social History:  reports that he has been smoking cigarettes. He has a 20.00 pack-year smoking history. He has never used smokeless tobacco. He reports previous alcohol use. He reports that he does not use drugs.  ROS: All other review of systems were reviewed and are negative except what is noted above in HPI  Physical Exam: BP (!) 196/71   Pulse 75   Temp 98.4 F (36.9 C)   Ht 6' (1.829 m)   Wt 190 lb (86.2 kg)   BMI 25.77 kg/m   Constitutional:  Alert and  oriented, No acute distress. HEENT: [place] AT, moist mucus membranes.  Trachea midline, no masses. Cardiovascular: No clubbing, cyanosis, or edema. Respiratory: Normal respiratory effort, no increased work of breathing. GI: Abdomen is soft, nontender, nondistended, no abdominal masses GU: No CVA tenderness.  Lymph: No cervical or inguinal lymphadenopathy. Skin: No rashes, bruises or suspicious lesions. Neurologic: Grossly intact, no focal deficits, moving all 4 extremities. Psychiatric: Normal mood and affect.  Laboratory Data: Lab Results  Component Value Date   WBC 8.7 04/03/2019   HGB 9.8 (L) 04/03/2019   HCT 34.2 (L) 04/03/2019   MCV 78.4 (L) 04/03/2019   PLT 350 04/03/2019    Lab Results  Component Value Date   CREATININE 1.20 04/03/2019    No results found for: PSA  No results found for: TESTOSTERONE  Lab Results  Component Value Date   HGBA1C 5.3 04/02/2017    Urinalysis    Component Value Date/Time   COLORURINE AMBER (A) 06/22/2018 1340   APPEARANCEUR CLOUDY (A) 06/22/2018 1340   APPEARANCEUR Cloudy 04/23/2011 1044   LABSPEC 1.015 06/22/2018 1340   LABSPEC 1.018 04/23/2011 1044   PHURINE 8.0 06/22/2018 1340   GLUCOSEU NEGATIVE 06/22/2018 1340   GLUCOSEU Negative 04/23/2011 1044   HGBUR NEGATIVE 06/22/2018 1340   [place] 06/22/2018 1340   BILIRUBINUR Negative 04/23/2011 1044    KETONESUR NEGATIVE 06/22/2018 1340   PROTEINUR 100 (A) 06/22/2018 1340   NITRITE NEGATIVE 06/22/2018 1340   LEUKOCYTESUR LARGE (A) 06/22/2018 1340   LEUKOCYTESUR Trace 04/23/2011 1044    Lab Results  Component Value Date   BACTERIA FEW (A) 06/22/2018    Pertinent Imaging:  No results found for this or any previous visit. No results found for this or any previous visit. No results found for this or any previous visit. No results found for this or any previous visit. Results for orders placed during the hospital encounter of 04/01/17  US RENAL   Narrative CLINICAL DATA:  Acute kidney injury  EXAM: RENAL / URINARY TRACT ULTRASOUND COMPLETE  COMPARISON:  None.  FINDINGS: Right Kidney:  Length: 10 cm. Echogenicity within normal limits. 1.6 x 1.2 x 1.2 cm hypoechoic right renal mass without internal Doppler flow, posterior acoustic enhancement or posterior acoustic shadowing. No hydronephrosis visualized.  Left Kidney:  Length: 14.6 cm. Echogenicity within normal limits. No mass or hydronephrosis visualized.  Bladder:  Appears normal for degree of bladder distention.  IMPRESSION: 1. No obstructive uropathy. 2. 1.6 x 1.2 x 1.2 cm hypoechoic right renal mass of indeterminate etiology. Appearance is likely to reflect a mildly complicated cyst. Recommend nonemergent MRI of the abdomen for better characterization.   Electronically Signed   By: Kathreen Devoid   On: 04/02/2017 12:31    No results found for this or any previous visit. Results for orders placed during the hospital encounter of 07/30/18  CT HEMATURIA WORKUP   Narrative CLINICAL DATA:  History of urolithiasis including bladder stone. Reported hematuria. Elevated PSA. Upcoming prostate biopsy.  EXAM: CT ABDOMEN AND PELVIS WITHOUT AND WITH CONTRAST  TECHNIQUE: Multidetector CT imaging of the abdomen and pelvis was performed following the standard protocol before and following the bolus administration  of intravenous contrast.  CONTRAST:  182mL ISOVUE-370 IOPAMIDOL (ISOVUE-370) INJECTION 76%  COMPARISON:  None.  FINDINGS: Lower chest: Tiny calcified granulomas in the basilar right lower lobe. Coronary atherosclerosis.  Hepatobiliary: Normal liver size. Simple 1.4 cm right liver dome cyst. A few additional scattered subcentimeter hypodense liver lesions, too small to characterize, which  require no follow-up unless the Michael [name] has risk factors for liver malignancy. Cholelithiasis. No biliary ductal dilatation.  Pancreas: Normal, with no mass or duct dilation.  Spleen: Normal size. No mass.  Adrenals/Urinary Tract: Normal adrenals. No renal stones. Mild right hydroureteronephrosis to the level of the right ureterovesical junction. No ureteral stones. Normal caliber left ureter. No left hydronephrosis. Asymmetric moderate right renal parenchymal atrophy. Normal size left kidney. Partially duplicated left renal collecting system to the level of the mid left pelvic ureter. Smooth urothelial wall thickening and enhancement throughout the right renal collecting system and right ureter. No left urinary tract urothelial wall thickening. No discrete filling defects in the bilateral collecting system or ureters. Nonspecific diffuse bladder wall thickening. Solitary 3.0 cm posterior left bladder stone. No bladder diverticula. Mass-effect on the bladder base by the enlarged nodular median lobe of the prostate. Otherwise no focal bladder mass.  Stomach/Bowel: Normal non-distended stomach. Normal caliber small bowel with no small bowel wall thickening. Normal appendix. Normal large bowel with no diverticulosis, large bowel wall thickening or pericolonic fat stranding.  Vascular/Lymphatic: Atherosclerotic abdominal aorta with 3.3 cm infrarenal abdominal aortic aneurysm. Patent portal, splenic, hepatic and renal veins. Retroaortic left renal vein. Enlarged 1.7 cm right common iliac node  (series 4/image 49). Enlarged 1.3 cm left common iliac node (series 4/image 49). Mild porta hepatis adenopathy up to 1.4 cm (series 4/image 16).  Reproductive: Markedly enlarged prostate measuring 8.5 x 7.4 x 7.9 cm (258 cc).  Other: No pneumoperitoneum, ascites or focal fluid collection. Tiny fat containing umbilical hernia.  Musculoskeletal: No aggressive appearing focal osseous lesions. Mild thoracolumbar spondylosis.  IMPRESSION: 1. Markedly enlarged prostate with mass-effect on the bladder base by the enlarged nodular median lobe of the prostate. 2. Solitary large 3 cm bladder stone.  No renal or ureteral stones. 3. Mild right hydroureteronephrosis to the level of the right UVJ. Nonspecific smooth diffuse urothelial wall thickening and enhancement throughout the right renal collecting system and right ureter, more likely inflammatory. Asymmetric moderate right renal parenchymal atrophy suggests a longstanding process. Obstruction may be due to the nodular median lobe of the prostate, which slightly asymmetrically involves the right bladder base. 4. Mild porta hepatis and bilateral common iliac lymphadenopathy, nonspecific. Suggest attention on follow-up CT abdomen/pelvis with oral and IV contrast in 3 months. This recommendation follows ACR consensus guidelines: White Paper of the ACR Incidental Findings Committee II on Splenic and Nodal Findings. J Am Coll Radiol E031985. 5. Infrarenal 3.3 cm Abdominal Aortic Aneurysm (ICD10-I71.9). Recommend follow-up aortic ultrasound in 3 years. This recommendation follows ACR consensus guidelines: White Paper of the ACR Incidental Findings Committee II on Vascular Findings. J Am Coll Radiol 2013; 10:789-794. 6.  Aortic Atherosclerosis (ICD10-I70.0).   Electronically Signed   By: Ilona Sorrel M.D.   On: 07/30/2018 10:33    No results found for this or any previous visit.  Assessment & Plan:    1. Benign prostatic  hyperplasia with urinary obstruction -improved after simple prostatectomy  2. Urinary retention -resolved  3. Elevated PSA -PSA today, will call with results   No follow-ups on file.  Nicolette Bang, MD  [facility] [place]

## 2019-06-19 NOTE — Progress Notes (Signed)

## 2019-06-20 LAB — PSA: PSA: 1.3 ng/mL (ref <=4.0)

## 2019-06-20 NOTE — Progress Notes (Signed)
Cardiology Office Note  Date: 06/21/2019   ID: Keontre Cloutier, DOB 01-17-1956, MRN [id number]  PCP:  [name], NP  Cardiologist:  Rozann Lesches, MD Electrophysiologist:  None   Chief Complaint  Patient presents with  . Cardiac follow-up    History of Present Illness: Michael [name] is a 64 y.o. male last seen in November 2020.  He presents for a routine visit.  He does not report any chest pain or shortness of breath beyond NYHA class II, no palpitations or syncope.  He states that he has had no swelling in his legs and his weight has been stable.  He is following with Dr. Alyson Ingles in the urology clinic.  He underwent a robot-assisted laparoscopic prostatectomy last year and has done well.  I reviewed his medications today.  He states that he ran out of Toprol-XL about a month ago but has been taking the remainder of his cardiac regimen.  He is overdue for follow-up with Dr. Oneida Alar at [facility].  Past Medical History:  Diagnosis Date  . Carotid artery disease ([place])   . Chronic diastolic heart failure ([place])   . CKD (chronic kidney disease) stage 3, GFR 30-59 ml/min   . COPD (chronic obstructive pulmonary disease) ([place])   . Coronary artery disease   . Essential hypertension   . Head and neck cancer ([place]) 2019   Right facial basal cell carcinoma s/p resection with right partial mastectomy and partal rhinectomy with skin graft (06/13/17)  . Iron deficiency anemia   . Urinary retention     Past Surgical History:  Procedure Laterality Date  . ANKLE CLOSED REDUCTION Right    open reduction  . BASAL CELL CARCINOMA EXCISION  2019   at [facility]  . COLONOSCOPY N/A 05/31/2017   Procedure: COLONOSCOPY;  Surgeon: Daneil Dolin, MD;  [facility];  Service: Endoscopy;  Laterality: N/A;  2:45pm  . CYSTOSCOPY N/A 10/29/2018   Procedure: Jilda Panda;  Surgeon: Ceasar Mons, MD;  [facility];  Service: Urology;  Laterality: N/A;  . POLYPECTOMY   05/31/2017   Procedure: POLYPECTOMY;  Surgeon: Daneil Dolin, MD;  [facility];  Service: Endoscopy;;  . TONSILLECTOMY    . XI ROBOTIC ASSISTED SIMPLE PROSTATECTOMY N/A 10/29/2018   Procedure: XI ROBOTIC ASSISTED SIMPLE PROSTATECTOMY;  Surgeon: Cleon Gustin, MD;  [facility];  Service: Urology;  Laterality: N/A;    Current Outpatient Medications  Medication Sig Dispense Refill  . aspirin EC 81 MG tablet Take 81 mg by mouth daily.    Marland Kitchen atorvastatin (LIPITOR) 40 MG tablet Take 1 tablet (40 mg total) by mouth every evening. 90 tablet 3  . ergocalciferol (VITAMIN D2) 1.25 MG (50000 UT) capsule Take 1 capsule (50,000 Units total) by mouth once a week. 16 capsule 3  . furosemide (LASIX) 40 MG tablet Take 1 tablet (40 mg total) by mouth every evening. 90 tablet 3  . isosorbide mononitrate (IMDUR) 30 MG 24 hr tablet Take 1 tablet (30 mg total) by mouth every evening. 90 tablet 3  . lisinopril (PRINIVIL,ZESTRIL) 5 MG tablet Take 1 tablet (5 mg total) by mouth daily for 30 days. (Patient taking differently: Take 5 mg by mouth every evening. ) 30 tablet 0  . metoprolol succinate (TOPROL-XL) 25 MG 24 hr tablet Take 0.5 tablets (12.5 mg total) by mouth every evening. 45 tablet 3  . nitroGLYCERIN (NITROSTAT) 0.4 MG SL tablet Place 1 tablet (0.4 mg total) under  the tongue every 5 (five) minutes as needed for chest pain. 90 tablet 3  . oxybutynin (DITROPAN) 5 MG tablet Take 1 tablet (5 mg total) by mouth 3 (three) times daily. 30 tablet 0   No current facility-administered medications for this visit.   Allergies:  Patient has no known allergies.   ROS:   No syncope.  Physical Exam: VS:  BP 132/82   Pulse 67   Ht 6' (1.829 m)   Wt 190 lb (86.2 kg)   SpO2 99%   BMI 25.77 kg/m , BMI Body mass index is 25.77 kg/m.  Wt Readings from Last 3 Encounters:  06/21/19 190 lb (86.2 kg)  06/19/19 190 lb (86.2 kg)  03/01/19 194 lb 12.8 oz (88.4 kg)    General: Patient appears  comfortable at rest. HEENT: Conjunctiva and lids normal, right facial scar following previous ENT surgery, wearing a mask. Neck: Supple, no elevated JVP. soft carotid bruits, no thyromegaly. Lungs: Clear to auscultation, nonlabored breathing at rest. Cardiac: Regular rate and rhythm, no S3, soft systolic murmur, no pericardial rub. Extremities: No pitting edema, distal pulses 2+.  ECG:  An ECG dated 10/25/2018 was personally reviewed today and demonstrated:  Sinus rhythm with prolonged PR interval, possible old anterior infarct pattern, frequent PVCs.  Recent Labwork: 04/03/2019: ALT 24; AST 23; BUN 17; Creatinine, Ser 1.20; Hemoglobin 9.8; Platelets 350; Potassium 4.0; Sodium 142     Component Value Date/Time   CHOL 194 04/02/2017 0541   TRIG 49 04/02/2017 0541   HDL 42 04/02/2017 0541   CHOLHDL 4.6 04/02/2017 0541   VLDL 10 04/02/2017 0541   LDLCALC 142 (H) 04/02/2017 0541    Other Studies Reviewed Today:  Echocardiogram 12/05/2017: Study Conclusions  - Left ventricle: The cavity size was normal. Wall thickness was increased in a pattern of mild LVH. Systolic function was mildly reduced. The estimated ejection fraction was in the range of 45% to 50%. There is hypokinesis of the basal-midinferolateral myocardium. Features are consistent with a pseudonormal left ventricular filling pattern, with concomitant abnormal relaxation and increased filling pressure (grade 2 diastolic dysfunction). - Aortic valve: Mildly calcified annulus. Trileaflet; mildly calcified leaflets. - Mitral valve: Mildly calcified annulus. There was mild regurgitation. - Left atrium: The atrium was mildly dilated. - Tricuspid valve: There was trivial regurgitation. - Pulmonary arteries: Systolic pressure could not be accurately estimated. - Pericardium, extracardiac: A trivial pericardial effusion was identified anterior to the heart.  Lexiscan Myoview 10/19/2017:  Findings  consistent with prior inferior/septal myocardial infarction with mild peri-infarct ischemia.  This is a high risk study. Risk primarily based on decreased LVEF. There is a prior infarct with only mild current ischemia. Consider correlating LVEF with echocardiogram.  The left ventricular ejection fraction is moderately decreased (33%).  Assessment and Plan:  1.  Ischemic heart disease based on noninvasive cardiac imaging studies, Myoview from 2019 is reviewed above.  LVEF 45 to 50% range by echocardiogram at that time.  We have managed this medically in the absence of progressive angina symptoms.  He remains clinically stable at this time.  Continue aspirin, Lipitor, Imdur, lisinopril, refill provided for Toprol-XL 12.5 mg daily.  As needed nitroglycerin is available.  2.  Calcified carotid artery disease, 70% stenosis of the proximal right ICA associated with dense calcification and no significant stenosis on the left.  This is being followed by Dr. Oneida Alar with [facility].  Continue aspirin and Lipitor.  Patient overdue for follow-up, this is being arranged.  3.  Essential  hypertension, systolic is in the [id number] today.  No change in current regimen, Toprol-XL is being refilled.  Medication Adjustments/Labs and Tests Ordered: Current medicines are reviewed at length with the patient today.  Concerns regarding medicines are outlined above.   Tests Ordered: Orders Placed This Encounter  Procedures  . Ambulatory referral to Vascular Surgery    Medication Changes: Meds ordered this encounter  Medications  . metoprolol succinate (TOPROL-XL) 25 MG 24 hr tablet    Sig: Take 0.5 tablets (12.5 mg total) by mouth every evening.    Dispense:  45 tablet    Refill:  3    06/21/19 dose decreased to 12.5 mg qd    Disposition:  Follow up 6 months in the [place] office.  Signed, Satira Sark, MD, [facility] 06/21/2019 8:43 AM    [facility] [place]. [address]  [place], [place] [address] Phone: [phone]; Fax: [phone]

## 2019-06-21 ENCOUNTER — Ambulatory Visit (INDEPENDENT_AMBULATORY_CARE_PROVIDER_SITE_OTHER): Payer: Medicare Other | Admitting: Cardiology

## 2019-06-21 ENCOUNTER — Other Ambulatory Visit: Payer: Self-pay

## 2019-06-21 ENCOUNTER — Encounter: Payer: Self-pay | Admitting: Cardiology

## 2019-06-21 VITALS — BP 132/82 | HR 67 | Ht 72.0 in | Wt 190.0 lb

## 2019-06-21 DIAGNOSIS — I259 Chronic ischemic heart disease, unspecified: Secondary | ICD-10-CM | POA: Diagnosis not present

## 2019-06-21 DIAGNOSIS — I6523 Occlusion and stenosis of bilateral carotid arteries: Secondary | ICD-10-CM | POA: Diagnosis not present

## 2019-06-21 DIAGNOSIS — I429 Cardiomyopathy, unspecified: Secondary | ICD-10-CM | POA: Diagnosis not present

## 2019-06-21 DIAGNOSIS — I10 Essential (primary) hypertension: Secondary | ICD-10-CM

## 2019-06-21 MED ORDER — METOPROLOL SUCCINATE ER 25 MG PO TB24: 12.5000 mg | ORAL_TABLET | Freq: Every evening | ORAL | 3 refills | Status: DC

## 2019-06-21 NOTE — Patient Instructions (Addendum)
.  Medication Instructions:  DECREASE Toprol XL to 12.5 mg daily  *If you need a refill on your cardiac medications before your next appointment, please call your pharmacy*   Lab Work: None today  If you have labs (blood work) drawn today and your tests are completely normal, you will receive your results only by: Marland Kitchen MyChart Message (if you have MyChart) OR . A paper copy in the mail If you have any lab test that is abnormal or we need to change your treatment, we will call you to review the results.   Testing/Procedures: None today   Follow-Up: At [facility], you and your health needs are our priority.  As part of our continuing mission to provide you with exceptional heart care, we have created designated Provider Care Teams.  These Care Teams include your primary Cardiologist (physician) and Advanced Practice Providers (APPs -  Physician Assistants and Nurse Practitioners) who all work together to provide you with the care you need, when you need it.  We recommend signing up for the patient portal called "MyChart".  Sign up information is provided on this After Visit Summary.  MyChart is used to connect with patients for Virtual Visits (Telemedicine).  Patients are able to view lab/test results, encounter notes, upcoming appointments, etc.  Non-urgent messages can be sent to your provider as well.   To learn more about what you can do with MyChart, go to [email or web address].    Your next appointment:   6 month(s)  The format for your next appointment:   In Person  Provider:   Rozann Lesches, MD   Other Instructions I have placed a referral for you to follow up with Ruta Hinds, at [facility]. Expect a call from them.       Thank you for choosing [facility] !

## 2019-06-25 NOTE — Progress Notes (Signed)
Letter mailed

## 2019-06-28 IMAGING — CT CT HEAD W/O CM
4 of 12 series · 15 of 47 positions shown, 17 images · non-contrast
Comparison: Prior CT from 04/03/2017.

CLINICAL DATA: Initial evaluation for acute trauma, fall. History
of prior facial tumor resection with reconstructive surgery.

EXAM:
CT HEAD WITHOUT CONTRAST
CT MAXILLOFACIAL WITHOUT CONTRAST
TECHNIQUE: Multidetector CT imaging of the head and maxillofacial structures
were performed using the standard protocol without intravenous
contrast. Multiplanar CT image reconstructions of the maxillofacial
structures were also generated.

[Series 8: st thins · axial · 0.39mm/px · z∈[-163,-37]mm · 7 of 242 slices shown, 9 images]
[im 31/242  brain]
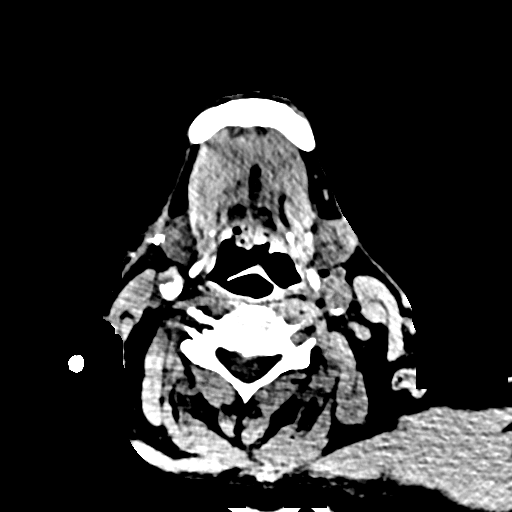
[im 31/242  bone]
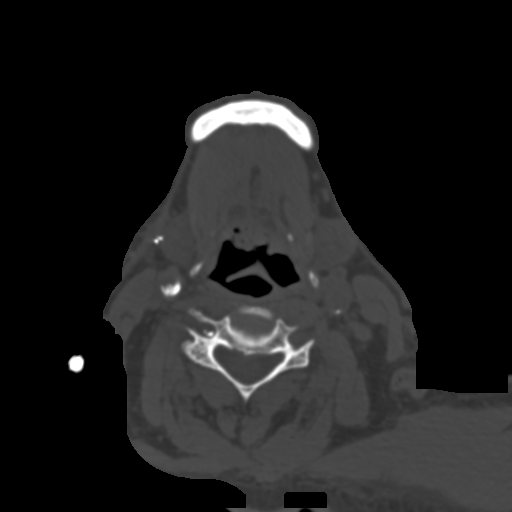
[im 61/242  brain]
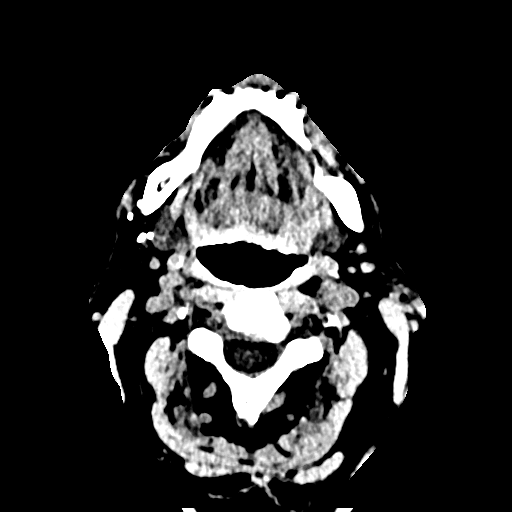
[im 91/242  brain]
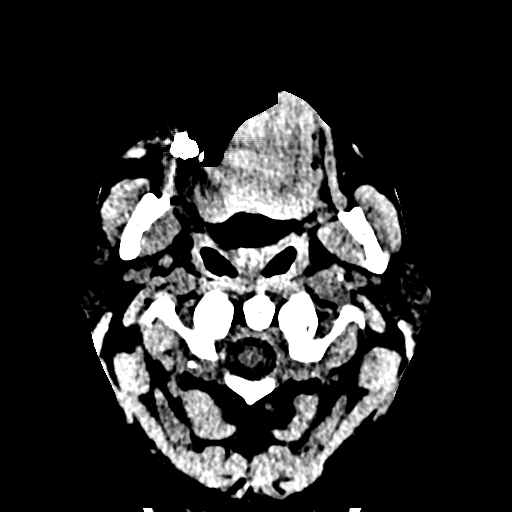
[im 121/242  brain]
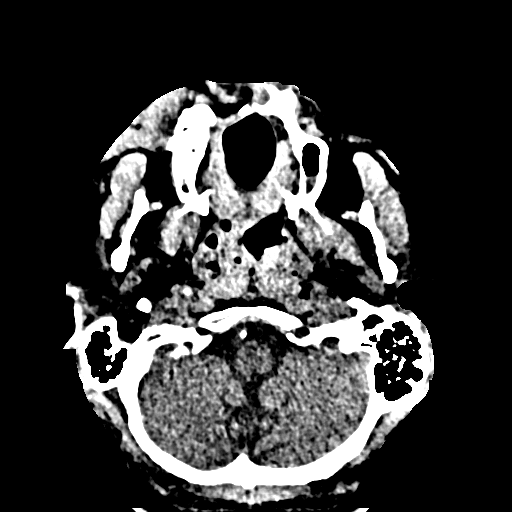
[im 151/242  brain]
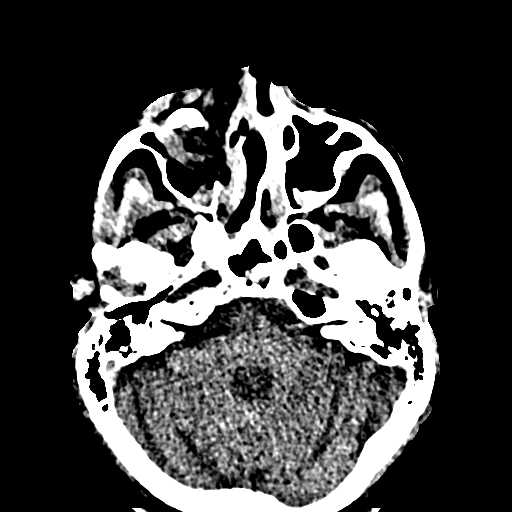
[im 151/242  bone]
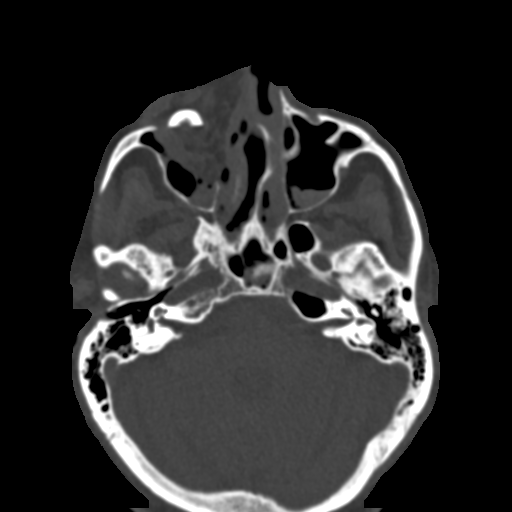
[im 181/242  brain]
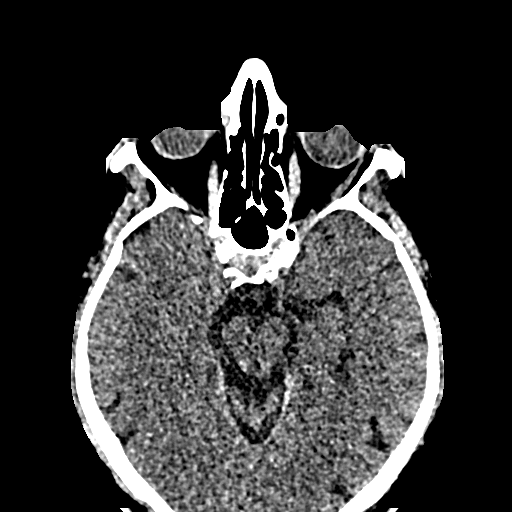
[im 211/242  brain]
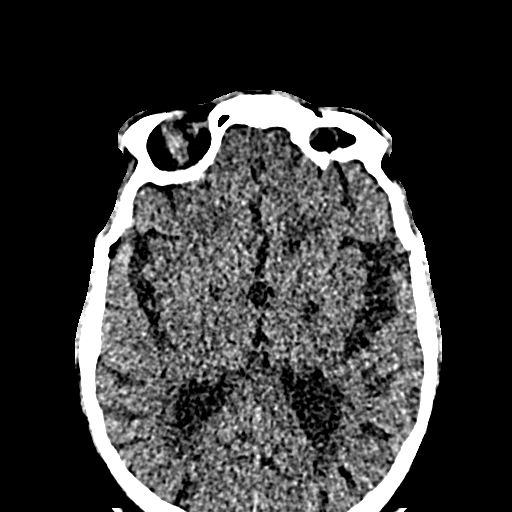

[Series 10: bone thins · axial · 0.39mm/px · z∈[-163,-79]mm · 5 of 242 slices shown]
[im 31/242  bone]
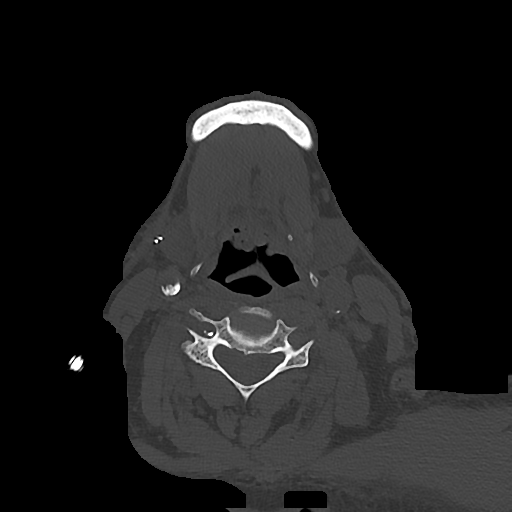
[im 61/242  bone]
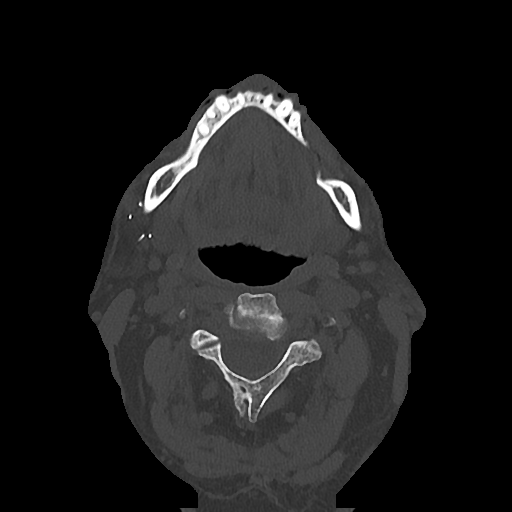
[im 91/242  bone]
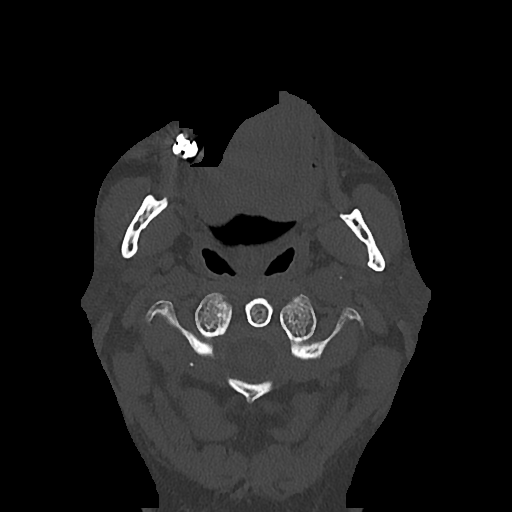
[im 121/242  bone]
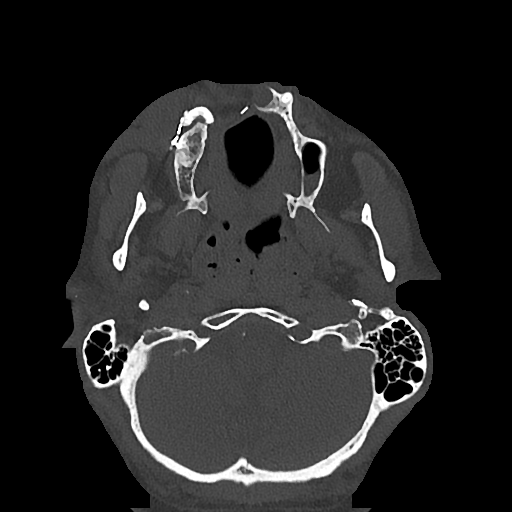
[im 151/242  bone]
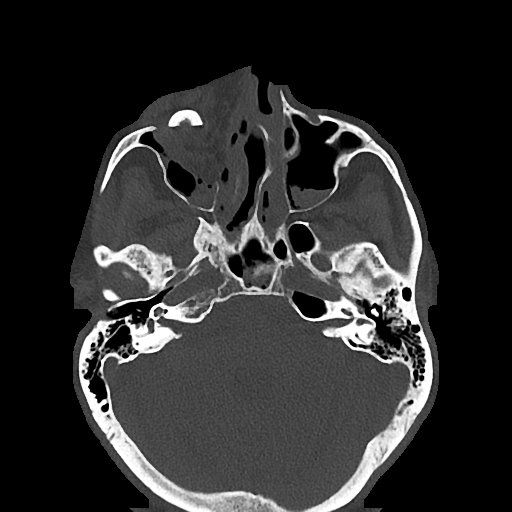

[Series 11: st cor · coronal · 0.33mm/px · 2 of 93 slices shown]
[im 31/93  brain]
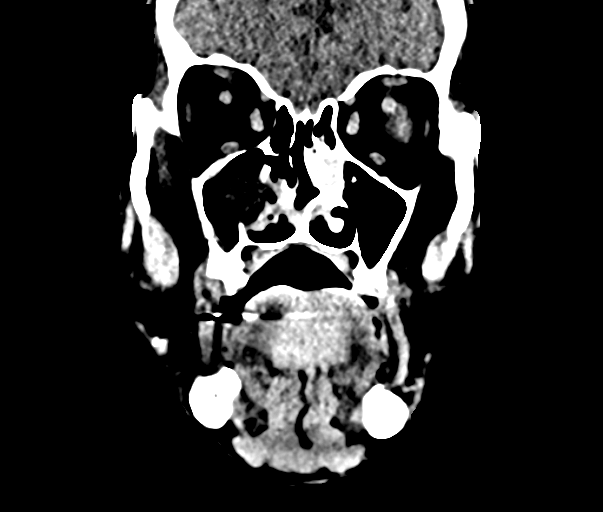
[im 62/93  brain]
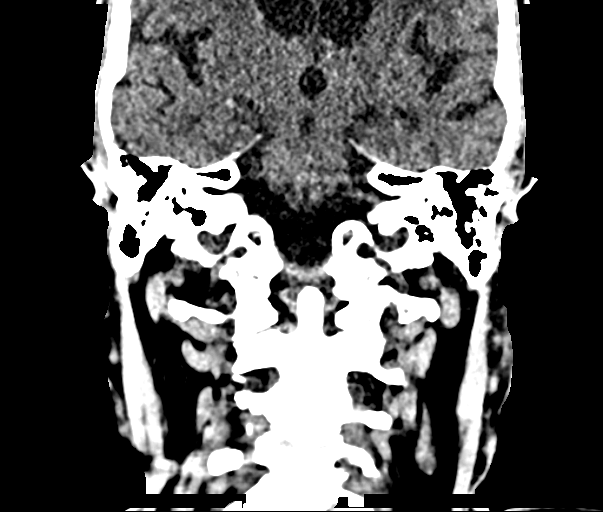

[Series 12: st sag · sagittal · 0.36mm/px · 1 of 96 slices shown]
[im 48/96  brain]
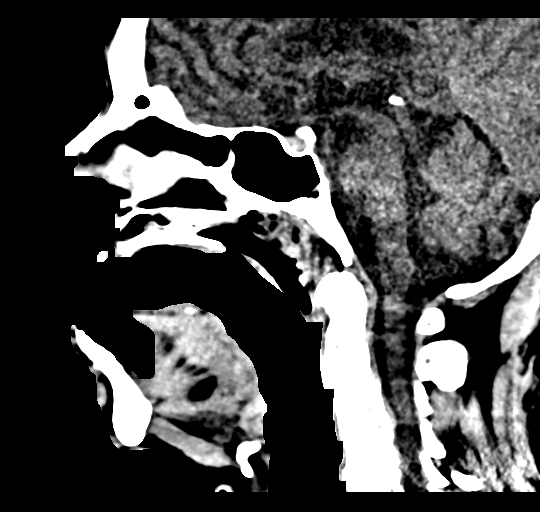

[15 of 47 positions shown; findings below may reference images not displayed]

FINDINGS: CT HEAD FINDINGS

Brain: Generalized age-related cerebral atrophy. Confluent
hypodensity within the periventricular and deep white matter both
cerebral hemispheres, consistent with advanced chronic small vessel
ischemic change. Remote lacunar infarct present within the bilateral
basal ganglia. No acute intracranial hemorrhage. No acute large
vessel territory infarct. No mass lesion, midline shift or mass
effect. No hydrocephalus. No extra-axial fluid collection.

Vascular: No asymmetric hyperdense vessel. Calcified atherosclerosis
present at the skull base

Skull: No acute scalp soft tissue abnormality.

Other: Mastoid air cells are clear.

CT MAXILLOFACIAL FINDINGS

Osseous: Postoperative changes from previous partial resection of
the right maxilla. Interval placement of prosthetic right facial
buttress implant. Inferior right nasal bone has been resected as
well. Plate screw fixation at the right orbital floor. Previously
identified mass at the right nasal labial fold has been resected
with probable free flap placement. Scattered surgical clips within
this region. Scattered surgical clips present throughout this region
as well as within the right submandibular space. Fat density with
right maxillary sinus likely postsurgical. No acute fracture about
the face. Mandible intact. Zygomatic arches intact. Right-to-left
nasal septal deviation which remains intact. Poor dentition with
scattered dental caries and periapical lucencies again noted.

Orbits: Postoperative changes at the right orbital floor as above.
Globes intact. No retro-orbital hematoma or other pathology. No
acute orbital fracture.

Sinuses: Scattered mucosal thickening within the sphenoid and
maxillary sinuses bilaterally.

Soft tissues: No appreciable soft tissue injury about the face.
Postsurgical changes at the right nasal labial fold. No definite
residual mass.
IMPRESSION: CT HEAD:

1. No acute intracranial abnormality.
2. Age-related cerebral atrophy with advanced chronic small vessel
ischemic disease.

CT MAXILLOFACIAL:

1. No acute maxillofacial injury identified.
2. Postoperative changes from prior right facial surgery for
resection of right nasolabial fold mass as above. No definite
recurrent mass identified.

## 2019-07-10 ENCOUNTER — Inpatient Hospital Stay (HOSPITAL_COMMUNITY): Payer: Medicare Other | Admitting: Nurse Practitioner

## 2019-07-10 ENCOUNTER — Inpatient Hospital Stay (HOSPITAL_COMMUNITY): Payer: Medicare Other | Attending: Hematology

## 2019-07-10 ENCOUNTER — Other Ambulatory Visit: Payer: Self-pay

## 2019-07-10 DIAGNOSIS — D509 Iron deficiency anemia, unspecified: Secondary | ICD-10-CM | POA: Insufficient documentation

## 2019-07-10 DIAGNOSIS — N183 Chronic kidney disease, stage 3 unspecified: Secondary | ICD-10-CM | POA: Diagnosis not present

## 2019-07-10 DIAGNOSIS — Z7982 Long term (current) use of aspirin: Secondary | ICD-10-CM | POA: Insufficient documentation

## 2019-07-10 DIAGNOSIS — I2510 Atherosclerotic heart disease of native coronary artery without angina pectoris: Secondary | ICD-10-CM | POA: Insufficient documentation

## 2019-07-10 DIAGNOSIS — Z85828 Personal history of other malignant neoplasm of skin: Secondary | ICD-10-CM | POA: Diagnosis not present

## 2019-07-10 DIAGNOSIS — D508 Other iron deficiency anemias: Secondary | ICD-10-CM

## 2019-07-10 DIAGNOSIS — I5032 Chronic diastolic (congestive) heart failure: Secondary | ICD-10-CM | POA: Insufficient documentation

## 2019-07-10 DIAGNOSIS — J449 Chronic obstructive pulmonary disease, unspecified: Secondary | ICD-10-CM | POA: Insufficient documentation

## 2019-07-10 DIAGNOSIS — F17210 Nicotine dependence, cigarettes, uncomplicated: Secondary | ICD-10-CM | POA: Diagnosis not present

## 2019-07-10 DIAGNOSIS — Z9079 Acquired absence of other genital organ(s): Secondary | ICD-10-CM | POA: Insufficient documentation

## 2019-07-10 DIAGNOSIS — Z79899 Other long term (current) drug therapy: Secondary | ICD-10-CM | POA: Diagnosis not present

## 2019-07-10 DIAGNOSIS — I110 Hypertensive heart disease with heart failure: Secondary | ICD-10-CM | POA: Diagnosis not present

## 2019-07-10 LAB — CBC WITH DIFFERENTIAL/PLATELET
Abs Immature Granulocytes: 0.04 10*3/uL (ref 0.00–0.07)
Basophils Absolute: 0.1 10*3/uL (ref 0.0–0.1)
Basophils Relative: 1 %
Eosinophils Absolute: 0.3 10*3/uL (ref 0.0–0.5)
Eosinophils Relative: 3 %
HCT: 25.5 % — ABNORMAL LOW (ref 39.0–52.0)
Hemoglobin: 7.2 g/dL — ABNORMAL LOW (ref 13.0–17.0)
Immature Granulocytes: 0 %
Lymphocytes Relative: 22 %
Lymphs Abs: 2.2 10*3/uL (ref 0.7–4.0)
MCH: 20.4 pg — ABNORMAL LOW (ref 26.0–34.0)
MCHC: 28.2 g/dL — ABNORMAL LOW (ref 30.0–36.0)
MCV: 72.2 fL — ABNORMAL LOW (ref 80.0–100.0)
Monocytes Absolute: 1.2 10*3/uL — ABNORMAL HIGH (ref 0.1–1.0)
Monocytes Relative: 11 %
Neutro Abs: 6.5 10*3/uL (ref 1.7–7.7)
Neutrophils Relative %: 63 %
Platelets: 453 10*3/uL — ABNORMAL HIGH (ref 150–400)
RBC: 3.53 MIL/uL — ABNORMAL LOW (ref 4.22–5.81)
RDW: 16.8 % — ABNORMAL HIGH (ref 11.5–15.5)
WBC: 10.3 10*3/uL (ref 4.0–10.5)
nRBC: 0 % (ref 0.0–0.2)

## 2019-07-10 LAB — VITAMIN D 25 HYDROXY (VIT D DEFICIENCY, FRACTURES): Vit D, 25-Hydroxy: 71.55 ng/mL (ref 30–100)

## 2019-07-10 LAB — VITAMIN B12: Vitamin B-12: 480 pg/mL (ref 180–914)

## 2019-07-10 LAB — COMPREHENSIVE METABOLIC PANEL
ALT: 17 U/L (ref 0–44)
AST: 20 U/L (ref 15–41)
Albumin: 3.4 g/dL — ABNORMAL LOW (ref 3.5–5.0)
Alkaline Phosphatase: 124 U/L (ref 38–126)
Anion gap: 10 (ref 5–15)
BUN: 28 mg/dL — ABNORMAL HIGH (ref 8–23)
CO2: 31 mmol/L (ref 22–32)
Calcium: 8.9 mg/dL (ref 8.9–10.3)
Chloride: 98 mmol/L (ref 98–111)
Creatinine, Ser: 1.46 mg/dL — ABNORMAL HIGH (ref 0.61–1.24)
GFR calc Af Amer: 58 mL/min — ABNORMAL LOW (ref >60)
GFR calc non Af Amer: 50 mL/min — ABNORMAL LOW (ref >60)
Glucose, Bld: 106 mg/dL — ABNORMAL HIGH (ref 70–99)
Potassium: 3.1 mmol/L — ABNORMAL LOW (ref 3.5–5.1)
Sodium: 139 mmol/L (ref 135–145)
Total Bilirubin: 0.3 mg/dL (ref 0.3–1.2)
Total Protein: 7.5 g/dL (ref 6.5–8.1)

## 2019-07-10 LAB — IRON AND TIBC
Iron: 13 ug/dL — ABNORMAL LOW (ref 45–182)
Saturation Ratios: 3 % — ABNORMAL LOW (ref 17.9–39.5)
TIBC: 508 ug/dL — ABNORMAL HIGH (ref 250–450)
UIBC: 495 ug/dL

## 2019-07-10 LAB — LACTATE DEHYDROGENASE: LDH: 96 U/L — ABNORMAL LOW (ref 98–192)

## 2019-07-10 LAB — FERRITIN: Ferritin: 4 ng/mL — ABNORMAL LOW (ref 24–336)

## 2019-07-11 ENCOUNTER — Other Ambulatory Visit: Payer: Self-pay

## 2019-07-11 ENCOUNTER — Inpatient Hospital Stay (HOSPITAL_BASED_OUTPATIENT_CLINIC_OR_DEPARTMENT_OTHER): Payer: Medicare Other | Admitting: Nurse Practitioner

## 2019-07-11 VITALS — BP 182/89 | HR 87 | Temp 97.8°F | Resp 18 | Wt 191.0 lb

## 2019-07-11 DIAGNOSIS — D509 Iron deficiency anemia, unspecified: Secondary | ICD-10-CM | POA: Diagnosis not present

## 2019-07-11 DIAGNOSIS — G479 Sleep disorder, unspecified: Secondary | ICD-10-CM

## 2019-07-11 DIAGNOSIS — D508 Other iron deficiency anemias: Secondary | ICD-10-CM

## 2019-07-11 MED ORDER — TEMAZEPAM 15 MG PO CAPS: 15.0000 mg | ORAL_CAPSULE | Freq: Every evening | ORAL | 0 refills | Status: DC | PRN

## 2019-07-11 NOTE — Progress Notes (Signed)
[address] [place], [place] [address]   CLINIC:  Medical Oncology/Hematology  PCP:  [name], NP [place] [address] [phone]   REASON FOR VISIT: Follow-up for iron deficiency anemia and history of basal cell carcinoma   CURRENT THERAPY: Intermittent iron infusions   INTERVAL HISTORY:  Mr. Fife 64 y.o. male returns for routine follow-up for iron deficiency anemia.  Patient reports he is more fatigued than normal.  He denies any shortness of breath.  He denies any bright red bleeding per rectum or melena.  He denies any easy bruising or bleeding. Denies any nausea, vomiting, or diarrhea. Denies any new pains. Had not noticed any recent bleeding such as epistaxis, hematuria or hematochezia. Denies recent chest pain on exertion, shortness of breath on minimal exertion, pre-syncopal episodes, or palpitations. Denies any numbness or tingling in hands or feet. Denies any recent fevers, infections, or recent hospitalizations. Patient reports appetite at 100% and energy level at 50%.  He is eating well maintain his weight this time.    REVIEW OF SYSTEMS:  Review of Systems  Constitutional: Positive for fatigue.  Neurological: Positive for dizziness.  Psychiatric/Behavioral: Positive for sleep disturbance.  All other systems reviewed and are negative.    PAST MEDICAL/SURGICAL HISTORY:  Past Medical History:  Diagnosis Date  . Carotid artery disease ([place])   . Chronic diastolic heart failure ([place])   . CKD (chronic kidney disease) stage 3, GFR 30-59 ml/min   . COPD (chronic obstructive pulmonary disease) ([place])   . Coronary artery disease   . Essential hypertension   . Head and neck cancer ([place]) 2019   Right facial basal cell carcinoma s/p resection with right partial mastectomy and partal rhinectomy with skin graft (06/13/17)  . Iron deficiency anemia   . Urinary retention    Past Surgical History:  Procedure  Laterality Date  . ANKLE CLOSED REDUCTION Right    open reduction  . BASAL CELL CARCINOMA EXCISION  2019   at [facility]  . COLONOSCOPY N/A 05/31/2017   Procedure: COLONOSCOPY;  Surgeon: Daneil Dolin, MD;  Location[facility];  Service: Endoscopy;  Laterality: N/A;  2:45pm  . CYSTOSCOPY N/A 10/29/2018   Procedure: Jilda Panda;  Surgeon: Ceasar Mons, MD;  Location: [facility];  Service: Urology;  Laterality: N/A;  . POLYPECTOMY  05/31/2017   Procedure: POLYPECTOMY;  Surgeon: Daneil Dolin, MD;  Location: [facility];  Service: Endoscopy;;  . TONSILLECTOMY    . XI ROBOTIC ASSISTED SIMPLE PROSTATECTOMY N/A 10/29/2018   Procedure: XI ROBOTIC ASSISTED SIMPLE PROSTATECTOMY;  Surgeon: Cleon Gustin, MD;  Location: [facility];  Service: Urology;  Laterality: N/A;     SOCIAL HISTORY:  Social History   Socioeconomic History  . Marital status: Married    Spouse name: Not on file  . Number of children: Not on file  . Years of education: Not on file  . Highest education level: Not on file  Occupational History  . Not on file  Tobacco Use  . Smoking status: Current Every Day Smoker    Packs/day: 0.50    Years: 40.00    Pack years: 20.00    Types: Cigarettes  . Smokeless tobacco: Never Used  Vaping Use  . Vaping Use: Never used  Substance and Sexual Activity  . Alcohol use: Not Currently  . Drug use: Never  . Sexual activity: Not on file  Other Topics Concern  .  Not on file  Social History Narrative  . Not on file   Social Determinants of Health   Financial Resource Strain:   . Difficulty of Paying Living Expenses:   Food Insecurity:   . Worried About Charity fundraiser in the Last Year:   . Arboriculturist in the Last Year:   Transportation Needs:   . Film/video editor (Medical):   Marland Kitchen Lack of Transportation (Non-Medical):   Physical Activity:   . Days of Exercise per Week:   . Minutes of Exercise per Session:   Stress:   . Feeling of Stress  :   Social Connections:   . Frequency of Communication with Friends and Family:   . Frequency of Social Gatherings with Friends and Family:   . Attends Religious Services:   . Active Member of Clubs or Organizations:   . Attends Archivist Meetings:   Marland Kitchen Marital Status:   Intimate Partner Violence:   . Fear of Current or Ex-Partner:   . Emotionally Abused:   Marland Kitchen Physically Abused:   . Sexually Abused:     FAMILY HISTORY:  Family History  Problem Relation Age of Onset  . Stroke Father   . Cirrhosis Mother   . Colon cancer Neg Hx     CURRENT MEDICATIONS:  Outpatient Encounter Medications as of 07/11/2019  Medication Sig  . aspirin EC 81 MG tablet Take 81 mg by mouth daily.  Marland Kitchen atorvastatin (LIPITOR) 40 MG tablet Take 1 tablet (40 mg total) by mouth every evening.  . ergocalciferol (VITAMIN D2) 1.25 MG (50000 UT) capsule Take 1 capsule (50,000 Units total) by mouth once a week.  . furosemide (LASIX) 40 MG tablet Take 1 tablet (40 mg total) by mouth every evening.  . isosorbide mononitrate (IMDUR) 30 MG 24 hr tablet Take 1 tablet (30 mg total) by mouth every evening.  Marland Kitchen lisinopril (PRINIVIL,ZESTRIL) 5 MG tablet Take 1 tablet (5 mg total) by mouth daily for 30 days. (Patient taking differently: Take 5 mg by mouth every evening. )  . metoprolol succinate (TOPROL-XL) 25 MG 24 hr tablet Take 0.5 tablets (12.5 mg total) by mouth every evening.  Marland Kitchen oxybutynin (DITROPAN) 5 MG tablet Take 1 tablet (5 mg total) by mouth 3 (three) times daily.  . nitroGLYCERIN (NITROSTAT) 0.4 MG SL tablet Place 1 tablet (0.4 mg total) under the tongue every 5 (five) minutes as needed for chest pain. (Patient not taking: Reported on 07/11/2019)   No facility-administered encounter medications on file as of 07/11/2019.    ALLERGIES:  No Known Allergies   PHYSICAL EXAM:  ECOG Performance status: 1  Vitals:   07/11/19 1117  BP: (!) 182/89  Pulse: 87  Resp: 18  Temp: 97.8 F (36.6 C)  SpO2: 100%     Filed Weights   07/11/19 1117  Weight: 191 lb (86.6 kg)   Physical Exam Constitutional:      Appearance: Normal appearance. He is normal weight.  Cardiovascular:     Rate and Rhythm: Normal rate and regular rhythm.     Heart sounds: Normal heart sounds.  Pulmonary:     Effort: Pulmonary effort is normal.     Breath sounds: Normal breath sounds.  Abdominal:     General: Bowel sounds are normal.     Palpations: Abdomen is soft.  Musculoskeletal:        General: Normal range of motion.  Skin:    General: Skin is warm.  Neurological:  Mental Status: He is alert and oriented to person, place, and time. Mental status is at baseline.  Psychiatric:        Mood and Affect: Mood normal.        Behavior: Behavior normal.        Thought Content: Thought content normal.        Judgment: Judgment normal.      LABORATORY DATA:  I have reviewed the labs as listed.  CBC    Component Value Date/Time   WBC 10.3 07/10/2019 0917   RBC 3.53 (L) 07/10/2019 0917   HGB 7.2 (L) 07/10/2019 0917   HGB 13.8 04/23/2011 0919   HCT 25.5 (L) 07/10/2019 0917   HCT 26.0 (L) 05/09/2018 0455   PLT 453 (H) 07/10/2019 0917   PLT 208 04/23/2011 0919   MCV 72.2 (L) 07/10/2019 0917   MCV 84 04/23/2011 0919   MCH 20.4 (L) 07/10/2019 0917   MCHC 28.2 (L) 07/10/2019 0917   RDW 16.8 (H) 07/10/2019 0917   RDW 12.3 04/23/2011 0919   LYMPHSABS 2.2 07/10/2019 0917   LYMPHSABS 1.1 04/23/2011 0919   MONOABS 1.2 (H) 07/10/2019 0917   MONOABS 0.9 (H) 04/23/2011 0919   EOSABS 0.3 07/10/2019 0917   EOSABS 0.0 04/23/2011 0919   BASOSABS 0.1 07/10/2019 0917   BASOSABS 0.0 04/23/2011 0919   CMP Latest Ref Rng & Units 07/10/2019 04/03/2019 03/01/2019  Glucose 70 - 99 mg/dL 106(H) 126(H) 109(H)  BUN 8 - 23 mg/dL 28(H) 17 21  Creatinine 0.61 - 1.24 mg/dL 1.46(H) 1.20 1.24  Sodium 135 - 145 mmol/L 139 142 142  Potassium 3.5 - 5.1 mmol/L 3.1(L) 4.0 3.6  Chloride 98 - 111 mmol/L 98 105 105  CO2 22 - 32 mmol/L 31  29 27   Calcium 8.9 - 10.3 mg/dL 8.9 9.1 9.1  Total Protein 6.5 - 8.1 g/dL 7.5 7.3 7.7  Total Bilirubin 0.3 - 1.2 mg/dL 0.3 0.5 0.4  Alkaline Phos 38 - 126 U/L 124 138(H) 132(H)  AST 15 - 41 U/L 20 23 21   ALT 0 - 44 U/L 17 24 18    All questions were answered to patient's stated satisfaction. Encouraged patient to call with any new concerns or questions before his next visit to the cancer center and we can certain see him sooner, if needed.     ASSESSMENT & PLAN:  Iron (Fe) deficiency anemia 1.  Iron deficiency anemia: -Likely due to CKD. -Last Feraheme infusion was on 04/11/2019 and 04/19/2019 -He denies any bright red bleeding per rectum or melena. -Labs done on 07/10/2019 showed his hemoglobin 7.2, ferritin 4, percent saturation 3 -Patient is able to walk around without shortness of breath or extreme fatigue or weakness. -We will give him 3 infusions of IV iron. -He will follow up in 4 weeks with repeat labs.  2.  Locally advanced basal cell carcinoma of the right nasolabial fold: -Biopsy of the right nasal mucosa on 04/05/2017 showed basal cell carcinoma. -Biopsy of the left cheek lesion on 04/02/2017 was consistent with basal cell carcinoma. -He was initially evaluated by Dr. Blenda Nicely at [facility] and then was referred to Dr. Mardee Postin at [facility]. -He underwent resection with skin graft on 06/06/2017.  The margins was close.  Rest of the margins were negative.  He was presented at tumor board at [facility].  Recommendation was not to give any adjuvant therapy.  Overall he is very happy with the results. -He follows up with his doctors at [facility]. -He has  a small area in the corner of his right eye that has come up.  Looks like it has been scratched and is scabbed over.  We will keep an eye on this.  3.  Uncontrolled hypertension: -Patient started back on his Coreg 12.5 mg twice daily.   -She will also continue Lipitor 40 mg daily and aspirin 81 mg daily. -He is following up with his PCP.   PCP aware of high blood pressure saw him a week and a half ago.     Orders placed this encounter:  Orders Placed This Encounter  Procedures  . CBC with Differential/Platelet  . Comprehensive metabolic panel  . Ferritin  . Iron and TIBC  . CBC with Differential/Platelet  . Comprehensive metabolic panel  . Ferritin  . Iron and TIBC  . VITAMIN D 25 Hydroxy (Vit-D Deficiency, Fractures)  . Vitamin B12      Francene Finders, FNP-C [place] [phone]

## 2019-07-11 NOTE — Addendum Note (Signed)
Addended by: Glennie Isle on: 07/11/2019 02:12 PM   Modules accepted: Orders

## 2019-07-11 NOTE — Patient Instructions (Signed)
[facility] Discharge Instructions  Follow up in 6 weeks with labs    Thank you for choosing  [facility] Center at [facility] to provide your oncology and hematology care.  To afford each patient quality time with our provider, please arrive at least 15 minutes before your scheduled appointment time.   If you have a lab appointment with the Cancer Center please come in thru the Main Entrance and check in at the main information desk.  You need to re-schedule your appointment should you arrive 10 or more minutes late.  We strive to give you quality time with our providers, and arriving late affects you and other patients whose appointments are after yours.  Also, if you no show three or more times for appointments you may be dismissed from the clinic at the providers discretion.     Again, thank you for choosing [facility].  Our hope is that these requests will decrease the amount of time that you wait before being seen by our physicians.       _____________________________________________________________  Should you have questions after your visit to [facility], please contact our office at [phone] between the hours of 8:00 a.m. and 4:30 p.m.  Voicemails left after 4:00 p.m. will not be returned until the following business day.  For prescription refill requests, have your pharmacy contact our office and allow 72 hours.    Due to Covid, you will need to wear a mask upon entering the hospital. If you do not have a mask, a mask will be given to you at the Main Entrance upon arrival. For doctor visits, patients may have 1 support person with them. For treatment visits, patients can not have anyone with them due to social distancing guidelines and our immunocompromised population.      

## 2019-07-11 NOTE — Assessment & Plan Note (Signed)
1.  Iron deficiency anemia: -Likely due to CKD. -Last Feraheme infusion was on 04/11/2019 and 04/19/2019 -He denies any bright red bleeding per rectum or melena. -Labs done on 07/10/2019 showed his hemoglobin 7.2, ferritin 4, percent saturation 3 -Patient is able to walk around without shortness of breath or extreme fatigue or weakness. -We will give him 3 infusions of IV iron. -He will follow up in 4 weeks with repeat labs.  2.  Locally advanced basal cell carcinoma of the right nasolabial fold: -Biopsy of the right nasal mucosa on 04/05/2017 showed basal cell carcinoma. -Biopsy of the left cheek lesion on 04/02/2017 was consistent with basal cell carcinoma. -He was initially evaluated by Dr. Blenda Nicely at [facility] and then was referred to Dr. Mardee Postin at [facility]. -He underwent resection with skin graft on 06/06/2017.  The margins was close.  Rest of the margins were negative.  He was presented at tumor board at [facility].  Recommendation was not to give any adjuvant therapy.  Overall he is very happy with the results. -He follows up with his doctors at [facility]. -He has a small area in the corner of his right eye that has come up.  Looks like it has been scratched and is scabbed over.  We will keep an eye on this.  3.  Uncontrolled hypertension: -Patient started back on his Coreg 12.5 mg twice daily.   -She will also continue Lipitor 40 mg daily and aspirin 81 mg daily. -He is following up with his PCP.  PCP aware of high blood pressure saw him a week and a half ago.

## 2019-07-12 ENCOUNTER — Inpatient Hospital Stay (HOSPITAL_COMMUNITY): Payer: Medicare Other

## 2019-07-12 VITALS — BP 167/63 | HR 63 | Temp 97.6°F | Resp 18

## 2019-07-12 DIAGNOSIS — D500 Iron deficiency anemia secondary to blood loss (chronic): Secondary | ICD-10-CM

## 2019-07-12 DIAGNOSIS — D509 Iron deficiency anemia, unspecified: Secondary | ICD-10-CM | POA: Diagnosis not present

## 2019-07-12 MED ORDER — SODIUM CHLORIDE 0.9 % IV SOLN: INTRAVENOUS | Status: DC

## 2019-07-12 MED ORDER — SODIUM CHLORIDE 0.9 % IV SOLN
510.0000 mg | Freq: Once | INTRAVENOUS | Status: AC
  Administered 2019-07-12: 510 mg via INTRAVENOUS
  Filled 2019-07-12: qty 510

## 2019-07-12 NOTE — Progress Notes (Signed)
Iron infusion given per orders. Patient tolerated it well without problems. Vitals stable and discharged home from clinic ambulatory. Follow up as scheduled.  

## 2019-07-19 ENCOUNTER — Other Ambulatory Visit: Payer: Self-pay

## 2019-07-19 ENCOUNTER — Inpatient Hospital Stay (HOSPITAL_COMMUNITY): Payer: Medicare Other

## 2019-07-19 ENCOUNTER — Encounter (HOSPITAL_COMMUNITY): Payer: Self-pay

## 2019-07-19 VITALS — BP 188/77 | HR 71 | Temp 97.5°F | Resp 18

## 2019-07-19 DIAGNOSIS — D508 Other iron deficiency anemias: Secondary | ICD-10-CM

## 2019-07-19 DIAGNOSIS — D509 Iron deficiency anemia, unspecified: Secondary | ICD-10-CM | POA: Diagnosis not present

## 2019-07-19 DIAGNOSIS — D500 Iron deficiency anemia secondary to blood loss (chronic): Secondary | ICD-10-CM

## 2019-07-19 LAB — COMPREHENSIVE METABOLIC PANEL
ALT: 23 U/L (ref 0–44)
AST: 28 U/L (ref 15–41)
Albumin: 3.6 g/dL (ref 3.5–5.0)
Alkaline Phosphatase: 150 U/L — ABNORMAL HIGH (ref 38–126)
Anion gap: 14 (ref 5–15)
BUN: 21 mg/dL (ref 8–23)
CO2: 29 mmol/L (ref 22–32)
Calcium: 8.9 mg/dL (ref 8.9–10.3)
Chloride: 97 mmol/L — ABNORMAL LOW (ref 98–111)
Creatinine, Ser: 1.51 mg/dL — ABNORMAL HIGH (ref 0.61–1.24)
GFR calc Af Amer: 55 mL/min — ABNORMAL LOW (ref >60)
GFR calc non Af Amer: 48 mL/min — ABNORMAL LOW (ref >60)
Glucose, Bld: 97 mg/dL (ref 70–99)
Potassium: 3.3 mmol/L — ABNORMAL LOW (ref 3.5–5.1)
Sodium: 140 mmol/L (ref 135–145)
Total Bilirubin: 0.6 mg/dL (ref 0.3–1.2)
Total Protein: 7.6 g/dL (ref 6.5–8.1)

## 2019-07-19 LAB — FERRITIN: Ferritin: 69 ng/mL (ref 24–336)

## 2019-07-19 LAB — CBC WITH DIFFERENTIAL/PLATELET
Abs Immature Granulocytes: 0.03 10*3/uL (ref 0.00–0.07)
Basophils Absolute: 0.1 10*3/uL (ref 0.0–0.1)
Basophils Relative: 1 %
Eosinophils Absolute: 0.3 10*3/uL (ref 0.0–0.5)
Eosinophils Relative: 3 %
HCT: 30.7 % — ABNORMAL LOW (ref 39.0–52.0)
Hemoglobin: 8.4 g/dL — ABNORMAL LOW (ref 13.0–17.0)
Immature Granulocytes: 0 %
Lymphocytes Relative: 24 %
Lymphs Abs: 2.4 10*3/uL (ref 0.7–4.0)
MCH: 21.5 pg — ABNORMAL LOW (ref 26.0–34.0)
MCHC: 27.4 g/dL — ABNORMAL LOW (ref 30.0–36.0)
MCV: 78.5 fL — ABNORMAL LOW (ref 80.0–100.0)
Monocytes Absolute: 0.9 10*3/uL (ref 0.1–1.0)
Monocytes Relative: 9 %
Neutro Abs: 6.1 10*3/uL (ref 1.7–7.7)
Neutrophils Relative %: 63 %
Platelets: 439 10*3/uL — ABNORMAL HIGH (ref 150–400)
RBC: 3.91 MIL/uL — ABNORMAL LOW (ref 4.22–5.81)
RDW: 23.7 % — ABNORMAL HIGH (ref 11.5–15.5)
WBC: 9.7 10*3/uL (ref 4.0–10.5)
nRBC: 0 % (ref 0.0–0.2)

## 2019-07-19 LAB — IRON AND TIBC
Iron: 29 ug/dL — ABNORMAL LOW (ref 45–182)
Saturation Ratios: 6 % — ABNORMAL LOW (ref 17.9–39.5)
TIBC: 494 ug/dL — ABNORMAL HIGH (ref 250–450)
UIBC: 465 ug/dL

## 2019-07-19 MED ORDER — SODIUM CHLORIDE 0.9 % IV SOLN
510.0000 mg | Freq: Once | INTRAVENOUS | Status: AC
  Administered 2019-07-19: 510 mg via INTRAVENOUS
  Filled 2019-07-19: qty 510

## 2019-07-19 MED ORDER — SODIUM CHLORIDE 0.9 % IV SOLN: INTRAVENOUS | Status: DC

## 2019-07-19 NOTE — Patient Instructions (Signed)
[facility] Discharge Instructions  Received Feraheme infusion today. Follow-up as scheduled   Thank you for choosing [facility] to provide your oncology and hematology care.  To afford each patient quality time with our provider, please arrive at least 15 minutes before your scheduled appointment time.   If you have a lab appointment with the Cancer Center please come in thru the Main Entrance and check in at the main information desk.  You need to re-schedule your appointment should you arrive 10 or more minutes late.  We strive to give you quality time with our providers, and arriving late affects you and other patients whose appointments are after yours.  Also, if you no show three or more times for appointments you may be dismissed from the clinic at the providers discretion.     Again, thank you for choosing [facility].  Our hope is that these requests will decrease the amount of time that you wait before being seen by our physicians.       _____________________________________________________________  Should you have questions after your visit to [facility], please contact our office at [phone] between the hours of 8:00 a.m. and 4:30 p.m.  Voicemails left after 4:00 p.m. will not be returned until the following business day.  For prescription refill requests, have your pharmacy contact our office and allow 72 hours.    Due to Covid, you will need to wear a mask upon entering the hospital. If you do not have a mask, a mask will be given to you at the Main Entrance upon arrival. For doctor visits, patients may have 1 support person with them. For treatment visits, patients can not have anyone with them due to social distancing guidelines and our immunocompromised population.     

## 2019-07-19 NOTE — Progress Notes (Signed)
Michael [name] tolerated Feraheme infusion well without complaints or incident. Peripheral IV site checked with positive blood return noted prior to and after infusion. VSS upon discharge. Pt discharged self ambulatory in satisfactory condition

## 2019-07-22 ENCOUNTER — Other Ambulatory Visit: Payer: Self-pay | Admitting: *Deleted

## 2019-07-22 DIAGNOSIS — I6529 Occlusion and stenosis of unspecified carotid artery: Secondary | ICD-10-CM

## 2019-07-23 ENCOUNTER — Other Ambulatory Visit (HOSPITAL_COMMUNITY): Payer: Self-pay | Admitting: Nurse Practitioner

## 2019-07-26 ENCOUNTER — Other Ambulatory Visit: Payer: Self-pay

## 2019-07-26 ENCOUNTER — Inpatient Hospital Stay (HOSPITAL_COMMUNITY): Payer: Medicare Other

## 2019-07-26 VITALS — BP 155/78 | HR 67 | Temp 97.9°F | Resp 18

## 2019-07-26 DIAGNOSIS — D509 Iron deficiency anemia, unspecified: Secondary | ICD-10-CM | POA: Diagnosis not present

## 2019-07-26 DIAGNOSIS — D500 Iron deficiency anemia secondary to blood loss (chronic): Secondary | ICD-10-CM

## 2019-07-26 MED ORDER — SODIUM CHLORIDE 0.9 % IV SOLN
510.0000 mg | Freq: Once | INTRAVENOUS | Status: AC
  Administered 2019-07-26: 510 mg via INTRAVENOUS
  Filled 2019-07-26: qty 510

## 2019-07-26 MED ORDER — SODIUM CHLORIDE 0.9 % IV SOLN: INTRAVENOUS | Status: DC

## 2019-07-26 NOTE — Progress Notes (Signed)
Iron infusion given per orders. Patient tolerated it well without problems. Vitals stable and discharged home from clinic ambulatory. Follow up as scheduled.  

## 2019-07-26 NOTE — Patient Instructions (Signed)
[facility]  Discharge Instructions:   _______________________________________________________________  Thank you for choosing [facility] at [facility] to provide your oncology and hematology care.  To afford each patient quality time with our providers, please arrive at least 15 minutes before your scheduled appointment.  You need to re-schedule your appointment if you arrive 10 or more minutes late.  We strive to give you quality time with our providers, and arriving late affects you and other patients whose appointments are after yours.  Also, if you no show three or more times for appointments you may be dismissed from the clinic.  Again, thank you for choosing [facility]. Our hope is that these requests will allow you access to exceptional care and in a timely manner. _______________________________________________________________  If you have questions after your visit, please contact our office at [phone] between the hours of 8:30 a.m. and 5:00 p.m. Voicemails left after 4:30 p.m. will not be returned until the following business day. _______________________________________________________________  For prescription refill requests, have your pharmacy contact our office. _______________________________________________________________  Recommendations made by the consultant and any test results will be sent to your referring physician. _______________________________________________________________ 

## 2019-07-29 ENCOUNTER — Encounter (HOSPITAL_COMMUNITY): Payer: Self-pay | Admitting: Lab

## 2019-07-29 NOTE — Progress Notes (Unsigned)
Referral to Dr Durene Cal dermatology/  appt 7/13 @ 11.  Pt aware

## 2019-08-01 ENCOUNTER — Encounter: Payer: Self-pay | Admitting: Vascular Surgery

## 2019-08-01 ENCOUNTER — Ambulatory Visit (INDEPENDENT_AMBULATORY_CARE_PROVIDER_SITE_OTHER): Payer: Medicare Other | Admitting: Vascular Surgery

## 2019-08-01 ENCOUNTER — Other Ambulatory Visit: Payer: Self-pay

## 2019-08-01 ENCOUNTER — Ambulatory Visit (HOSPITAL_COMMUNITY)
Admission: RE | Admit: 2019-08-01 | Discharge: 2019-08-01 | Disposition: A | Discharge status: Home / Self Care | Payer: Medicare Other | Source: Ambulatory Visit | Attending: Vascular Surgery | Admitting: Vascular Surgery

## 2019-08-01 VITALS — BP 189/99 | HR 68 | Temp 98.2°F | Resp 20 | Ht 72.0 in | Wt 185.0 lb

## 2019-08-01 DIAGNOSIS — I6521 Occlusion and stenosis of right carotid artery: Secondary | ICD-10-CM

## 2019-08-01 DIAGNOSIS — I6529 Occlusion and stenosis of unspecified carotid artery: Secondary | ICD-10-CM

## 2019-08-01 NOTE — Progress Notes (Signed)
Patient name: Michael [name] MRN: [id number] DOB: March 02, 1955 Sex: male    HPI: Michael [name] is a 64 y.o. male, who in 2019 he underwent modified neck dissection for a craniofacial squamous cell carcinoma.  He has done well since that time.  He has known right internal carotid artery stenosis which has been asymptomatic.  He continues to have no symptoms of TIA amaurosis or stroke.  He is on aspirin and a statin.  Other medical problems include CKD 3, COPD, coronary artery disease, hypertension all of which have been stable.  Past Medical History:  Diagnosis Date  . Carotid artery disease ([place])   . Chronic diastolic heart failure ([place])   . CKD (chronic kidney disease) stage 3, GFR 30-59 ml/min   . COPD (chronic obstructive pulmonary disease) ([place])   . Coronary artery disease   . Essential hypertension   . Head and neck cancer ([place]) 2019   Right facial basal cell carcinoma s/p resection with right partial mastectomy and partal rhinectomy with skin graft (06/13/17)  . Iron deficiency anemia   . Urinary retention    Past Surgical History:  Procedure Laterality Date  . ANKLE CLOSED REDUCTION Right    open reduction  . BASAL CELL CARCINOMA EXCISION  2019   [facility]  . COLONOSCOPY N/A 05/31/2017   Procedure: COLONOSCOPY;  Surgeon: Daneil Dolin, MD;  [facility];  Service: Endoscopy;  Laterality: N/A;  2:45pm  . CYSTOSCOPY N/A 10/29/2018   Procedure: Jilda Panda;  Surgeon: Ceasar Mons, MD;  [facility]: [facility];  Service: Urology;  Laterality: N/A;  . POLYPECTOMY  05/31/2017   Procedure: POLYPECTOMY;  Surgeon: Daneil Dolin, MD;  [facility];  Service: Endoscopy;;  . TONSILLECTOMY    . XI ROBOTIC ASSISTED SIMPLE PROSTATECTOMY N/A 10/29/2018   Procedure: XI ROBOTIC ASSISTED SIMPLE PROSTATECTOMY;  Surgeon: Cleon Gustin, MD;  [facility];  Service: Urology;  Laterality: N/A;    Family History  Problem Relation Age of Onset  .  Stroke Father   . Cirrhosis Mother   . Colon cancer Neg Hx     SOCIAL HISTORY: Social History   Socioeconomic History  . Marital status: Married    Spouse name: Not on file  . Number of children: Not on file  . Years of education: Not on file  . Highest education level: Not on file  Occupational History  . Not on file  Tobacco Use  . Smoking status: Current Every Day Smoker    Packs/day: 0.50    Years: 40.00    Pack years: 20.00    Types: Cigarettes  . Smokeless tobacco: Never Used  Vaping Use  . Vaping Use: Never used  Substance and Sexual Activity  . Alcohol use: Not Currently  . Drug use: Never  . Sexual activity: Not on file  Other Topics Concern  . Not on file  Social History Narrative  . Not on file   Social Determinants of Health   Financial Resource Strain:   . Difficulty of Paying Living Expenses:   Food Insecurity:   . Worried About Charity fundraiser in the Last Year:   . Arboriculturist in the Last Year:   Transportation Needs:   . Film/video editor (Medical):   Marland Kitchen Lack of Transportation (Non-Medical):   Physical Activity:   . Days of Exercise per Week:   . Minutes of Exercise per Session:   Stress:   .  Feeling of Stress :   Social Connections:   . Frequency of Communication with Friends and Family:   . Frequency of Social Gatherings with Friends and Family:   . Attends Religious Services:   . Active Member of Clubs or Organizations:   . Attends Archivist Meetings:   Marland Kitchen Marital Status:   Intimate Partner Violence:   . Fear of Current or Ex-Partner:   . Emotionally Abused:   Marland Kitchen Physically Abused:   . Sexually Abused:     No Known Allergies  Current Outpatient Medications  Medication Sig Dispense Refill  . aspirin EC 81 MG tablet Take 81 mg by mouth daily.    Marland Kitchen atorvastatin (LIPITOR) 40 MG tablet Take 1 tablet (40 mg total) by mouth every evening. 90 tablet 3  . ergocalciferol (VITAMIN D2) 1.25 MG (50000 UT) capsule Take 1  capsule (50,000 Units total) by mouth once a week. 16 capsule 3  . furosemide (LASIX) 40 MG tablet Take 1 tablet (40 mg total) by mouth every evening. 90 tablet 3  . isosorbide mononitrate (IMDUR) 30 MG 24 hr tablet Take 1 tablet (30 mg total) by mouth every evening. 90 tablet 3  . lisinopril (PRINIVIL,ZESTRIL) 5 MG tablet Take 1 tablet (5 mg total) by mouth daily for 30 days. (Patient taking differently: Take 5 mg by mouth every evening. ) 30 tablet 0  . lisinopril (ZESTRIL) 10 MG tablet Take 10 mg by mouth daily.    . metoprolol succinate (TOPROL-XL) 25 MG 24 hr tablet Take 0.5 tablets (12.5 mg total) by mouth every evening. 45 tablet 3  . nitroGLYCERIN (NITROSTAT) 0.4 MG SL tablet Place 1 tablet (0.4 mg total) under the tongue every 5 (five) minutes as needed for chest pain. 90 tablet 3  . oxybutynin (DITROPAN) 5 MG tablet Take 1 tablet (5 mg total) by mouth 3 (three) times daily. 30 tablet 0  . temazepam (RESTORIL) 15 MG capsule Take 1 capsule (15 mg total) by mouth at bedtime as needed for sleep. 30 capsule 0   No current facility-administered medications for this visit.    ROS:   General:  No weight loss, Fever, chills  HEENT: No recent headaches, no nasal bleeding, no visual changes, no sore throat  Neurologic: No dizziness, blackouts, seizures. No recent symptoms of stroke or mini- stroke. No recent episodes of slurred speech, or temporary blindness.  Cardiac: No recent episodes of chest pain/pressure, no shortness of breath at rest.  No shortness of breath with exertion.  Denies history of atrial fibrillation or irregular heartbeat  Vascular: No history of rest pain in feet.  No history of claudication.  No history of non-healing ulcer, No history of DVT   Pulmonary: No home oxygen, no productive cough, no hemoptysis,  No asthma or wheezing  Musculoskeletal:  [ ]  Arthritis, [ ]  Low back pain,  [ ]  Joint pain  Hematologic:No history of hypercoagulable state.  No history of easy  bleeding.  No history of anemia  Gastrointestinal: No hematochezia or melena,  No gastroesophageal reflux, no trouble swallowing  Urinary: [ ]  chronic Kidney disease, [ ]  on HD - [ ]  MWF or [ ]  TTHS, [ ]  Burning with urination, [ ]  Frequent urination, [ ]  Difficulty urinating;   Skin: No rashes  Psychological: No history of anxiety,  No history of depression   Physical Examination  Vitals:   08/01/19 1327 08/01/19 1331  BP: (!) 191/85 (!) 189/99  Pulse: 68   Resp: 20  Temp: 98.2 F (36.8 C)   SpO2: 99%   Weight: 185 lb (83.9 kg)   Height: 6' (1.829 m)     Body mass index is 25.09 kg/m.  General:  Alert and oriented, no acute distress HEENT: Right side cranial nasal deformity from previous nasal labial resection Neck: Well-healed right submandibular scar Cardiac: Regular Rate and Rhythm Skin: No rash Musculoskeletal: No deformity or edema  Neurologic: Upper and lower extremity motor 5/5 and symmetric  DATA: Patient had a carotid duplex exam today which showed greater than 80% right internal carotid artery stenosis less than 40% left internal carotid artery stenosis.  Also reviewed the patient's CT angiogram for approximately 18 months ago which again showed similar findings.  I reviewed and interpreted all of these images.  Although I was previously concerned about calcification on the right side it does not appear as ominous and I think he would potentially be a candidate for T CAR.  Assessment: Asymptomatic greater than 80% right internal carotid artery stenosis with previous head and neck surgery for cancer.  Plan: We will repeat his CT angiogram with a view to proceed with right TCAR stenting if anatomically suitable.  If we proceed with TCAR the patient will need to be started on Plavix in addition to his aspirin and statin.  Ruta Hinds, MD [facility] Office: [phone]

## 2019-08-12 ENCOUNTER — Encounter (HOSPITAL_COMMUNITY): Payer: Self-pay

## 2019-08-12 ENCOUNTER — Other Ambulatory Visit: Payer: Self-pay

## 2019-08-12 ENCOUNTER — Ambulatory Visit (HOSPITAL_COMMUNITY)
Admission: RE | Admit: 2019-08-12 | Discharge: 2019-08-12 | Disposition: A | Discharge status: Home / Self Care | Payer: Medicare Other | Source: Ambulatory Visit | Attending: Vascular Surgery | Admitting: Vascular Surgery

## 2019-08-12 ENCOUNTER — Telehealth: Payer: Self-pay

## 2019-08-12 DIAGNOSIS — I6521 Occlusion and stenosis of right carotid artery: Secondary | ICD-10-CM | POA: Diagnosis present

## 2019-08-12 LAB — POCT I-STAT CREATININE: Creatinine, Ser: 2.5 mg/dL — ABNORMAL HIGH (ref 0.61–1.24)

## 2019-08-12 MED ORDER — IOHEXOL 350 MG/ML SOLN: 100.0000 mL | Freq: Once | INTRAVENOUS | Status: DC | PRN

## 2019-08-12 MED ORDER — SODIUM CHLORIDE (PF) 0.9 % IJ SOLN
INTRAMUSCULAR | Status: AC
  Filled 2019-08-12: qty 50

## 2019-08-12 NOTE — Telephone Encounter (Signed)
Michael [name] Michael [name]) called to report pt's Istat creatinine is 2.5, CTA head/neck is unable to be completed. S/w PA in office and was advised to consult Dr. Oneida Alar. Will contact Dr. Oneida Alar for advise

## 2019-08-14 ENCOUNTER — Inpatient Hospital Stay (HOSPITAL_COMMUNITY): Payer: Medicare Other | Attending: Hematology

## 2019-08-14 ENCOUNTER — Other Ambulatory Visit: Payer: Self-pay

## 2019-08-14 DIAGNOSIS — I110 Hypertensive heart disease with heart failure: Secondary | ICD-10-CM | POA: Insufficient documentation

## 2019-08-14 DIAGNOSIS — N183 Chronic kidney disease, stage 3 unspecified: Secondary | ICD-10-CM | POA: Insufficient documentation

## 2019-08-14 DIAGNOSIS — D508 Other iron deficiency anemias: Secondary | ICD-10-CM

## 2019-08-14 DIAGNOSIS — Z79899 Other long term (current) drug therapy: Secondary | ICD-10-CM | POA: Diagnosis not present

## 2019-08-14 DIAGNOSIS — Z85828 Personal history of other malignant neoplasm of skin: Secondary | ICD-10-CM | POA: Insufficient documentation

## 2019-08-14 DIAGNOSIS — J440 Chronic obstructive pulmonary disease with acute lower respiratory infection: Secondary | ICD-10-CM | POA: Insufficient documentation

## 2019-08-14 DIAGNOSIS — F17210 Nicotine dependence, cigarettes, uncomplicated: Secondary | ICD-10-CM | POA: Insufficient documentation

## 2019-08-14 DIAGNOSIS — N401 Enlarged prostate with lower urinary tract symptoms: Secondary | ICD-10-CM | POA: Insufficient documentation

## 2019-08-14 DIAGNOSIS — I5043 Acute on chronic combined systolic (congestive) and diastolic (congestive) heart failure: Secondary | ICD-10-CM | POA: Insufficient documentation

## 2019-08-14 DIAGNOSIS — D509 Iron deficiency anemia, unspecified: Secondary | ICD-10-CM | POA: Diagnosis present

## 2019-08-14 LAB — CBC WITH DIFFERENTIAL/PLATELET
Abs Immature Granulocytes: 0.03 10*3/uL (ref 0.00–0.07)
Basophils Absolute: 0.1 10*3/uL (ref 0.0–0.1)
Basophils Relative: 1 %
Eosinophils Absolute: 0.4 10*3/uL (ref 0.0–0.5)
Eosinophils Relative: 4 %
HCT: 31.4 % — ABNORMAL LOW (ref 39.0–52.0)
Hemoglobin: 9.4 g/dL — ABNORMAL LOW (ref 13.0–17.0)
Immature Granulocytes: 0 %
Lymphocytes Relative: 23 %
Lymphs Abs: 2.4 10*3/uL (ref 0.7–4.0)
MCH: 25.5 pg — ABNORMAL LOW (ref 26.0–34.0)
MCHC: 29.9 g/dL — ABNORMAL LOW (ref 30.0–36.0)
MCV: 85.1 fL (ref 80.0–100.0)
Monocytes Absolute: 1 10*3/uL (ref 0.1–1.0)
Monocytes Relative: 9 %
Neutro Abs: 6.5 10*3/uL (ref 1.7–7.7)
Neutrophils Relative %: 63 %
Platelets: 330 10*3/uL (ref 150–400)
RBC: 3.69 MIL/uL — ABNORMAL LOW (ref 4.22–5.81)
RDW: 23.6 % — ABNORMAL HIGH (ref 11.5–15.5)
WBC: 10.4 10*3/uL (ref 4.0–10.5)
nRBC: 0 % (ref 0.0–0.2)

## 2019-08-14 LAB — IRON AND TIBC
Iron: 34 ug/dL — ABNORMAL LOW (ref 45–182)
Saturation Ratios: 9 % — ABNORMAL LOW (ref 17.9–39.5)
TIBC: 370 ug/dL (ref 250–450)
UIBC: 336 ug/dL

## 2019-08-14 LAB — COMPREHENSIVE METABOLIC PANEL
ALT: 25 U/L (ref 0–44)
AST: 27 U/L (ref 15–41)
Albumin: 3.7 g/dL (ref 3.5–5.0)
Alkaline Phosphatase: 157 U/L — ABNORMAL HIGH (ref 38–126)
Anion gap: 10 (ref 5–15)
BUN: 29 mg/dL — ABNORMAL HIGH (ref 8–23)
CO2: 27 mmol/L (ref 22–32)
Calcium: 9.2 mg/dL (ref 8.9–10.3)
Chloride: 102 mmol/L (ref 98–111)
Creatinine, Ser: 2.23 mg/dL — ABNORMAL HIGH (ref 0.61–1.24)
GFR calc Af Amer: 35 mL/min — ABNORMAL LOW (ref >60)
GFR calc non Af Amer: 30 mL/min — ABNORMAL LOW (ref >60)
Glucose, Bld: 84 mg/dL (ref 70–99)
Potassium: 3.7 mmol/L (ref 3.5–5.1)
Sodium: 139 mmol/L (ref 135–145)
Total Bilirubin: 0.3 mg/dL (ref 0.3–1.2)
Total Protein: 7.3 g/dL (ref 6.5–8.1)

## 2019-08-14 LAB — FERRITIN: Ferritin: 64 ng/mL (ref 24–336)

## 2019-08-14 LAB — VITAMIN D 25 HYDROXY (VIT D DEFICIENCY, FRACTURES): Vit D, 25-Hydroxy: 54.6 ng/mL (ref 30–100)

## 2019-08-14 LAB — VITAMIN B12: Vitamin B-12: 567 pg/mL (ref 180–914)

## 2019-08-15 ENCOUNTER — Ambulatory Visit (INDEPENDENT_AMBULATORY_CARE_PROVIDER_SITE_OTHER): Payer: Medicare Other | Admitting: Vascular Surgery

## 2019-08-15 ENCOUNTER — Encounter: Payer: Self-pay | Admitting: Vascular Surgery

## 2019-08-15 ENCOUNTER — Other Ambulatory Visit: Payer: Self-pay

## 2019-08-15 DIAGNOSIS — I6521 Occlusion and stenosis of right carotid artery: Secondary | ICD-10-CM | POA: Diagnosis not present

## 2019-08-15 NOTE — Progress Notes (Signed)
Patient is a 64 year old male who was seen on August 01, 2019 for continued follow-up of carotid occlusive disease.  He continues to have no symptoms of TIA amaurosis or stroke.  Office visit today was conducted as a telephone visit.  2 identifiers were used.  Patient was at home and I was at our office on [facility].  He was scheduled for a CT angiogram of the neck to evaluate for TCAR stenting.  But his creatinine was found to be greater than 2 and the procedure was canceled.  He states he did not have he had any history of renal dysfunction.  He had an appointment scheduled with his primary care physician but missed that because he did not have a ride.  His last CT angiogram was about 18 months ago.  Assessment: Patient currently with greater than 80% right internal carotid artery stenosis asymptomatic with prior head neck surgery for cancer.  Plan: We will get him an evaluation with Dr. Wende Neighbors his primary care physician within the next few weeks to evaluate his renal function to see if there is anything we can improve this prior to his CT scan.  I will see the patient back in follow-up after his kidney function has been fully evaluated.  Approximately 5 minutes were spent today on the telephone visit.  Ruta Hinds, MD [facility] [place] Office: [phone]

## 2019-08-20 ENCOUNTER — Ambulatory Visit (HOSPITAL_COMMUNITY): Payer: Medicare Other | Admitting: Nurse Practitioner

## 2019-08-20 NOTE — Progress Notes (Deleted)
Michael [name], [place] [id number]   CLINIC:  Medical Oncology/Hematology  PCP:  [name], NP [place] [phone]   REASON FOR VISIT:  Follow-up for ***  PRIOR THERAPY: ***  NGS Results: ***  CURRENT THERAPY: ***  BRIEF ONCOLOGIC HISTORY:  Oncology History   No history exists.    CANCER STAGING: Cancer Staging No matching staging information was found for the patient.   INTERVAL HISTORY:  Michael [name] 64 y.o. male returns for routine follow-up and consideration for next cycle of chemotherapy.   Due for cycle #*** of *** today.   Overall, he tells me he has been feeling pretty well. Energy levels ***; appetite ***.   Overall, he feels ready for next cycle of chemo today.     REVIEW OF SYSTEMS:  Review of Systems - Oncology   PAST MEDICAL/SURGICAL HISTORY:  Past Medical History:  Diagnosis Date  . Carotid artery disease ([place])   . Chronic diastolic heart failure ([place])   . CKD (chronic kidney disease) stage 3, GFR 30-59 ml/min   . COPD (chronic obstructive pulmonary disease) ([place])   . Coronary artery disease   . Essential hypertension   . Head and neck cancer ([place]) 2019   Right facial basal cell carcinoma s/p resection with right partial mastectomy and partal rhinectomy with skin graft (06/13/17)  . Iron deficiency anemia   . Urinary retention    Past Surgical History:  Procedure Laterality Date  . ANKLE CLOSED REDUCTION Right    open reduction  . BASAL CELL CARCINOMA EXCISION  2019   [facility]  . COLONOSCOPY N/A 05/31/2017   Procedure: COLONOSCOPY;  Surgeon: Daneil Dolin, MD;  [facility];  Service: Endoscopy;  Laterality: N/A;  2:45pm  . CYSTOSCOPY N/A 10/29/2018   Procedure: Jilda Panda;  Surgeon: Ceasar Mons, MD;  [facility];  Service: Urology;  Laterality: N/A;  . POLYPECTOMY  05/31/2017   Procedure: POLYPECTOMY;  Surgeon: Daneil Dolin, MD;  [facility];  Service: Endoscopy;;  . TONSILLECTOMY    . XI ROBOTIC ASSISTED SIMPLE PROSTATECTOMY N/A 10/29/2018   Procedure: XI ROBOTIC ASSISTED SIMPLE PROSTATECTOMY;  Surgeon: Cleon Gustin, MD;  [facility];  Service: Urology;  Laterality: N/A;     SOCIAL HISTORY:  Social History   Socioeconomic History  . Marital status: Married    Spouse name: Not on file  . Number of children: Not on file  . Years of education: Not on file  . Highest education level: Not on file  Occupational History  . Not on file  Tobacco Use  . Smoking status: Current Every Day Smoker    Packs/day: 0.50    Years: 40.00    Pack years: 20.00    Types: Cigarettes  . Smokeless tobacco: Never Used  Vaping Use  . Vaping Use: Never used  Substance and Sexual Activity  . Alcohol use: Not Currently  . Drug use: Never  . Sexual activity: Not on file  Other Topics Concern  . Not on file  Social History Narrative  . Not on file   Social Determinants of Health   Financial Resource Strain:   . Difficulty of Paying Living Expenses:   Food Insecurity:   . Worried About Charity fundraiser in the Last Year:   . Arboriculturist in the Last Year:   Transportation Needs:   .  Lack of Transportation (Medical):   Marland Kitchen Lack of Transportation (Non-Medical):   Physical Activity:   . Days of Exercise per Week:   . Minutes of Exercise per Session:   Stress:   . Feeling of Stress :   Social Connections:   . Frequency of Communication with Friends and Family:   . Frequency of Social Gatherings with Friends and Family:   . Attends Religious Services:   . Active Member of Clubs or Organizations:   . Attends Archivist Meetings:   Marland Kitchen Marital Status:   Intimate Partner Violence:   . Fear of Current or Ex-Partner:   . Emotionally Abused:   Marland Kitchen Physically Abused:   . Sexually Abused:     FAMILY HISTORY:  Family History  Problem Relation Age of Onset  . Stroke Father     . Cirrhosis Mother   . Colon cancer Neg Hx     CURRENT MEDICATIONS:  Outpatient Encounter Medications as of 08/20/2019  Medication Sig  . aspirin EC 81 MG tablet Take 81 mg by mouth daily.  Marland Kitchen atorvastatin (LIPITOR) 40 MG tablet Take 1 tablet (40 mg total) by mouth every evening.  . ergocalciferol (VITAMIN D2) 1.25 MG (50000 UT) capsule Take 1 capsule (50,000 Units total) by mouth once a week.  . furosemide (LASIX) 40 MG tablet Take 1 tablet (40 mg total) by mouth every evening.  . isosorbide mononitrate (IMDUR) 30 MG 24 hr tablet Take 1 tablet (30 mg total) by mouth every evening.  Marland Kitchen lisinopril (PRINIVIL,ZESTRIL) 5 MG tablet Take 1 tablet (5 mg total) by mouth daily for 30 days. (Patient taking differently: Take 5 mg by mouth every evening. )  . lisinopril (ZESTRIL) 10 MG tablet Take 10 mg by mouth daily.  . metoprolol succinate (TOPROL-XL) 25 MG 24 hr tablet Take 0.5 tablets (12.5 mg total) by mouth every evening.  . nitroGLYCERIN (NITROSTAT) 0.4 MG SL tablet Place 1 tablet (0.4 mg total) under the tongue every 5 (five) minutes as needed for chest pain.  Marland Kitchen oxybutynin (DITROPAN) 5 MG tablet Take 1 tablet (5 mg total) by mouth 3 (three) times daily.  . temazepam (RESTORIL) 15 MG capsule Take 1 capsule (15 mg total) by mouth at bedtime as needed for sleep.   No facility-administered encounter medications on file as of 08/20/2019.    ALLERGIES:  No Known Allergies   PHYSICAL EXAM:  ECOG Performance status: ***  There were no vitals filed for this visit. There were no vitals filed for this visit. Physical Exam   LABORATORY DATA:  I have reviewed the labs as listed.  CBC    Component Value Date/Time   WBC 10.4 08/14/2019 1017   RBC 3.69 (L) 08/14/2019 1017   HGB 9.4 (L) 08/14/2019 1017   HGB 13.8 04/23/2011 0919   HCT 31.4 (L) 08/14/2019 1017   HCT 26.0 (L) 05/09/2018 0455   PLT 330 08/14/2019 1017   PLT 208 04/23/2011 0919   MCV 85.1 08/14/2019 1017   MCV 84 04/23/2011  0919   MCH 25.5 (L) 08/14/2019 1017   MCHC 29.9 (L) 08/14/2019 1017   RDW 23.6 (H) 08/14/2019 1017   RDW 12.3 04/23/2011 0919   LYMPHSABS 2.4 08/14/2019 1017   LYMPHSABS 1.1 04/23/2011 0919   MONOABS 1.0 08/14/2019 1017   MONOABS 0.9 (H) 04/23/2011 0919   EOSABS 0.4 08/14/2019 1017   EOSABS 0.0 04/23/2011 0919   BASOSABS 0.1 08/14/2019 1017   BASOSABS 0.0 04/23/2011 0919   CMP Latest  Ref Rng & Units 08/14/2019 08/12/2019 07/19/2019  Glucose 70 - 99 mg/dL 84 - 97  BUN 8 - 23 mg/dL 29(H) - 21  Creatinine 0.61 - 1.24 mg/dL 2.23(H) 2.50(H) 1.51(H)  Sodium 135 - 145 mmol/L 139 - 140  Potassium 3.5 - 5.1 mmol/L 3.7 - 3.3(L)  Chloride 98 - 111 mmol/L 102 - 97(L)  CO2 22 - 32 mmol/L 27 - 29  Calcium 8.9 - 10.3 mg/dL 9.2 - 8.9  Total Protein 6.5 - 8.1 g/dL 7.3 - 7.6  Total Bilirubin 0.3 - 1.2 mg/dL 0.3 - 0.6  Alkaline Phos 38 - 126 U/L 157(H) - 150(H)  AST 15 - 41 U/L 27 - 28  ALT 0 - 44 U/L 25 - 23    DIAGNOSTIC IMAGING:  I have independently reviewed the scans and discussed with the patient.  ASSESSMENT & PLAN:  Iron (Fe) deficiency anemia 1.  Iron deficiency anemia: -Likely due to CKD. -Last Feraheme infusion was on 04/11/2019 and 04/19/2019 -He denies any bright red bleeding per rectum or melena. -Labs done on 07/10/2019 showed his hemoglobin 7.2, ferritin 4, percent saturation 3 -Patient is able to walk around without shortness of breath or extreme fatigue or weakness. -We will give him 3 infusions of IV iron. -He will follow up in 4 weeks with repeat labs.  2.  Locally advanced basal cell carcinoma of the right nasolabial fold: -Biopsy of the right nasal mucosa on 04/05/2017 showed basal cell carcinoma. -Biopsy of the left cheek lesion on 04/02/2017 was consistent with basal cell carcinoma. -He was initially evaluated by Dr. Blenda Nicely at [facility] and then was referred to Dr. Mardee Postin at [facility]. -He underwent resection with skin graft on 06/06/2017.  The margins was close.   Rest of the margins were negative.  He was presented at tumor board at [facility].  Recommendation was not to give any adjuvant therapy.  Overall he is very happy with the results. -He follows up with his doctors at [facility]. -He has a small area in the corner of his right eye that has come up.  Looks like it has been scratched and is scabbed over.  We will keep an eye on this.  3.  Uncontrolled hypertension: -Patient started back on his Coreg 12.5 mg twice daily.   -She will also continue Lipitor 40 mg daily and aspirin 81 mg daily. -He is following up with his PCP.  PCP aware of high blood pressure saw him a week and a half ago.     Orders placed this encounter:  No orders of the defined types were placed in this encounter.     Michael Jack, MD [place] [phone]

## 2019-08-20 NOTE — Assessment & Plan Note (Deleted)
1.  Iron deficiency anemia: -Likely due to CKD. -Last Feraheme infusion was on 04/11/2019 and 04/19/2019 -He denies any bright red bleeding per rectum or melena. -Labs done on 07/10/2019 showed his hemoglobin 7.2, ferritin 4, percent saturation 3 -Patient is able to walk around without shortness of breath or extreme fatigue or weakness. -We will give him 3 infusions of IV iron. -He will follow up in 4 weeks with repeat labs.  2.  Locally advanced basal cell carcinoma of the right nasolabial fold: -Biopsy of the right nasal mucosa on 04/05/2017 showed basal cell carcinoma. -Biopsy of the left cheek lesion on 04/02/2017 was consistent with basal cell carcinoma. -He was initially evaluated by Dr. Blenda Nicely at [facility] and then was referred to Dr. Mardee Postin at [facility]. -He underwent resection with skin graft on 06/06/2017.  The margins was close.  Rest of the margins were negative.  He was presented at tumor board at [facility].  Recommendation was not to give any adjuvant therapy.  Overall he is very happy with the results. -He follows up with his doctors at [facility]. -He has a small area in the corner of his right eye that has come up.  Looks like it has been scratched and is scabbed over.  We will keep an eye on this.  3.  Uncontrolled hypertension: -Patient started back on his Coreg 12.5 mg twice daily.   -She will also continue Lipitor 40 mg daily and aspirin 81 mg daily. -He is following up with his PCP.  PCP aware of high blood pressure saw him a week and a half ago.

## 2019-08-23 ENCOUNTER — Inpatient Hospital Stay (HOSPITAL_BASED_OUTPATIENT_CLINIC_OR_DEPARTMENT_OTHER): Payer: Medicare Other | Admitting: Nurse Practitioner

## 2019-08-23 ENCOUNTER — Other Ambulatory Visit (HOSPITAL_COMMUNITY): Payer: Self-pay | Admitting: Nurse Practitioner

## 2019-08-23 DIAGNOSIS — D508 Other iron deficiency anemias: Secondary | ICD-10-CM

## 2019-08-23 DIAGNOSIS — D500 Iron deficiency anemia secondary to blood loss (chronic): Secondary | ICD-10-CM

## 2019-08-23 DIAGNOSIS — G479 Sleep disorder, unspecified: Secondary | ICD-10-CM

## 2019-08-23 MED ORDER — TEMAZEPAM 15 MG PO CAPS: 15.0000 mg | ORAL_CAPSULE | Freq: Every evening | ORAL | 0 refills | Status: DC | PRN

## 2019-08-23 MED ORDER — FERROUS SULFATE 325 (65 FE) MG PO TBEC: 325.0000 mg | DELAYED_RELEASE_TABLET | Freq: Every day | ORAL | 3 refills | Status: DC

## 2019-08-23 NOTE — Progress Notes (Signed)
[facility] Follow up:    Michael Haste, NP [address] [place] [address]   DIAGNOSIS: Iron deficiency anemia  CURRENT THERAPY: Intermittent IV iron infusions  INTERVAL HISTORY: Michael [name] 64 y.o. male was called for a telephone visit for iron deficiency anemia.  Patient reports he is doing well since last visit.  He denies any bright red bleeding per rectum or melena.  Denies any easy bruising or bleeding. Denies any nausea, vomiting, or diarrhea. Denies any new pains. Had not noticed any recent bleeding such as epistaxis, hematuria or hematochezia. Denies recent chest pain on exertion, shortness of breath on minimal exertion, pre-syncopal episodes, or palpitations. Denies any numbness or tingling in hands or feet. Denies any recent fevers, infections, or recent hospitalizations. Patient reports appetite at 100% and energy level at 75%.  He is eating well maintain his weight this time.    Patient Active Problem List   Diagnosis Date Noted  . Elevated PSA 06/19/2019  . Benign prostatic hyperplasia with urinary obstruction 10/29/2018  . CAP (community acquired pneumonia) 05/09/2018  . Hypertensive emergency 05/09/2018  . Acute on chronic combined systolic and diastolic CHF (congestive heart failure) ([place]) 05/09/2018  . Lobar pneumonia ([place]) 05/09/2018  . Acute on chronic combined systolic and diastolic congestive heart failure ([place]) 05/09/2018  . Urinary retention 11/27/2017  . Abnormal nuclear stress test 11/22/2017  . Complicated UTI (urinary tract infection)   . Sepsis ([place]) 09/17/2017  . Vasovagal syncope 09/17/2017  . Sepsis secondary to UTI ([place]) 09/01/2017  . Syncope 07/05/2017  . COPD exacerbation ([place]) 07/05/2017  . Acute renal failure superimposed on chronic kidney disease ([place]) 07/05/2017  . Catheter-associated urinary tract infection ([place]) 07/05/2017  . Leukocytosis 07/05/2017  . Urinary retention with incomplete bladder emptying  07/05/2017  . UTI (urinary tract infection) 07/05/2017  . Diarrhea 07/05/2017  . Carotid artery disease ([place]) 06/25/2017  . AKI (acute kidney injury) ([place])   . Orthostatic syncope   . Syncope due to orthostatic hypotension 06/24/2017  . Heart murmur 06/24/2017  . Abnormal EKG 06/24/2017  . Abnormal PET scan of colon 05/25/2017  . 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 Periapical abscess 04/04/2017  . Iron (Fe) deficiency anemia 04/04/2017  . Essential hypertension 04/02/2017  . Anemia 04/02/2017  . Acute hypoxemic respiratory failure ([place]) 04/02/2017  . Tobacco abuse 04/02/2017  . Chronic diastolic CHF (congestive heart failure) ([place]) 04/02/2017  . Elevated troponin 04/02/2017  . Thrombocytosis ([place]) 04/02/2017  . Skin lesion of face   . Nasal lesion     has No Known Allergies.  MEDICAL HISTORY: Past Medical History:  Diagnosis Date  . Carotid artery disease ([place])   . Chronic diastolic heart failure ([place])   . CKD (chronic kidney disease) stage 3, GFR 30-59 ml/min   . COPD (chronic obstructive pulmonary disease) ([place])   . Coronary artery disease   . Essential hypertension   . Head and neck cancer ([place]) 2019   Right facial basal cell carcinoma s/p resection with right partial mastectomy and partal rhinectomy with skin graft (06/13/17)  . Iron deficiency anemia   . Urinary retention     SURGICAL HISTORY: Past Surgical History:  Procedure Laterality Date  . ANKLE CLOSED REDUCTION Right    open reduction  . BASAL CELL CARCINOMA EXCISION  2019   [facility]  . COLONOSCOPY N/A 05/31/2017   Procedure: COLONOSCOPY;  Surgeon: Daneil Dolin, MD;  [facility];  Service: Endoscopy;  Laterality: N/A;  2:45pm  . CYSTOSCOPY N/A 10/29/2018   Procedure: Jilda Panda;  Surgeon: Ceasar Mons, MD;  Location: [facility];  Service: Urology;  Laterality: N/A;  . POLYPECTOMY  05/31/2017   Procedure: POLYPECTOMY;  Surgeon: Daneil Dolin,  MD;  Location: [facility];  Service: Endoscopy;;  . TONSILLECTOMY    . XI ROBOTIC ASSISTED SIMPLE PROSTATECTOMY N/A 10/29/2018   Procedure: XI ROBOTIC ASSISTED SIMPLE PROSTATECTOMY;  Surgeon: Cleon Gustin, MD;  Location: [facility];  Service: Urology;  Laterality: N/A;    SOCIAL HISTORY: Social History   Socioeconomic History  . Marital status: Married    Spouse name: Not on file  . Number of children: Not on file  . Years of education: Not on file  . Highest education level: Not on file  Occupational History  . Not on file  Tobacco Use  . Smoking status: Current Every Day Smoker    Packs/day: 0.50    Years: 40.00    Pack years: 20.00    Types: Cigarettes  . Smokeless tobacco: Never Used  Vaping Use  . Vaping Use: Never used  Substance and Sexual Activity  . Alcohol use: Not Currently  . Drug use: Never  . Sexual activity: Not on file  Other Topics Concern  . Not on file  Social History Narrative  . Not on file   Social Determinants of Health   Financial Resource Strain:   . Difficulty of Paying Living Expenses:   Food Insecurity:   . Worried About Charity fundraiser in the Last Year:   . Arboriculturist in the Last Year:   Transportation Needs:   . Film/video editor (Medical):   Marland Kitchen Lack of Transportation (Non-Medical):   Physical Activity:   . Days of Exercise per Week:   . Minutes of Exercise per Session:   Stress:   . Feeling of Stress :   Social Connections:   . Frequency of Communication with Friends and Family:   . Frequency of Social Gatherings with Friends and Family:   . Attends Religious Services:   . Active Member of Clubs or Organizations:   . Attends Archivist Meetings:   Marland Kitchen Marital Status:   Intimate Partner Violence:   . Fear of Current or Ex-Partner:   . Emotionally Abused:   Marland Kitchen Physically Abused:   . Sexually Abused:     FAMILY HISTORY: Family History  Problem Relation Age of Onset  . Stroke Father   . Cirrhosis  Mother   . Colon cancer Neg Hx     Review of Systems  All other systems reviewed and are negative.   Vital signs: -Deferred due to telephone visit  Physical Exam -Deferred to telephone visit -Patient was alert and oriented over the phone and in no acute distress next  LABORATORY DATA:  CBC    Component Value Date/Time   WBC 10.4 08/14/2019 1017   RBC 3.69 (L) 08/14/2019 1017   HGB 9.4 (L) 08/14/2019 1017   HGB 13.8 04/23/2011 0919   HCT 31.4 (L) 08/14/2019 1017   HCT 26.0 (L) 05/09/2018 0455   PLT 330 08/14/2019 1017   PLT 208 04/23/2011 0919   MCV 85.1 08/14/2019 1017   MCV 84 04/23/2011 0919   MCH 25.5 (L) 08/14/2019 1017   MCHC 29.9 (L) 08/14/2019 1017   RDW 23.6 (H) 08/14/2019 1017   RDW 12.3 04/23/2011 0919   LYMPHSABS  2.4 08/14/2019 1017   LYMPHSABS 1.1 04/23/2011 0919   MONOABS 1.0 08/14/2019 1017   MONOABS 0.9 (H) 04/23/2011 0919   EOSABS 0.4 08/14/2019 1017   EOSABS 0.0 04/23/2011 0919   BASOSABS 0.1 08/14/2019 1017   BASOSABS 0.0 04/23/2011 0919    CMP     Component Value Date/Time   NA 139 08/14/2019 1017   NA 140 04/23/2011 0919   K 3.7 08/14/2019 1017   K 2.4 (LL) 04/23/2011 0919   CL 102 08/14/2019 1017   CL 100 04/23/2011 0919   CO2 27 08/14/2019 1017   CO2 28 04/23/2011 0919   GLUCOSE 84 08/14/2019 1017   GLUCOSE 101 (H) 04/23/2011 0919   BUN 29 (H) 08/14/2019 1017   BUN 17 04/23/2011 0919   CREATININE 2.23 (H) 08/14/2019 1017   CREATININE 1.18 04/23/2011 0919   CALCIUM 9.2 08/14/2019 1017   CALCIUM 8.4 (L) 04/23/2011 0919   PROT 7.3 08/14/2019 1017   PROT 7.3 04/23/2011 0919   ALBUMIN 3.7 08/14/2019 1017   ALBUMIN 3.2 (L) 04/23/2011 0919   AST 27 08/14/2019 1017   AST 64 (H) 04/23/2011 0919   ALT 25 08/14/2019 1017   ALT 70 04/23/2011 0919   ALKPHOS 157 (H) 08/14/2019 1017   ALKPHOS 62 04/23/2011 0919   BILITOT 0.3 08/14/2019 1017   BILITOT 1.0 04/23/2011 0919   GFRNONAA 30 (L) 08/14/2019 1017   GFRNONAA >60 04/23/2011 0919    GFRAA 35 (L) 08/14/2019 1017   GFRAA >60 04/23/2011 0919    All questions were answered to patient's stated satisfaction. Encouraged patient to call with any new concerns or questions before his next visit to the cancer center and we can certain see him sooner, if needed.     ASSESSMENT and THERAPY PLAN:   Iron (Fe) deficiency anemia 1.  Iron deficiency anemia: -Likely due to CKD. -Last Feraheme infusion was on 07/12/2019, 07/19/2019, 07/26/2019 -He denies any bright red bleeding per rectum or melena. -Labs done on 08/14/2019 showed his hemoglobin 9.4, ferritin 64, percent saturation 9 -He wants to try oral iron pills.  We will call him in a prescription -He will follow up in 6 weeks with repeat labs.  2.  Locally advanced basal cell carcinoma of the right nasolabial fold: -Biopsy of the right nasal mucosa on 04/05/2017 showed basal cell carcinoma. -Biopsy of the left cheek lesion on 04/02/2017 was consistent with basal cell carcinoma. -He was initially evaluated by Dr. Blenda Nicely at [facility] and then was referred to Dr. Mardee Postin at [facility]. -He underwent resection with skin graft on 06/06/2017.  The margins was close.  Rest of the margins were negative.  He was presented at tumor board at [facility].  Recommendation was not to give any adjuvant therapy.  Overall he is very happy with the results. -He follows up with his doctors at [facility]. -He has a small area in the corner of his right eye that has come up.  Looks like it has been scratched and is scabbed over. -He was sent to dermatology to follow-up with [place].  3.  Uncontrolled hypertension: -Patient started back on his Coreg 12.5 mg twice daily.   -She will also continue Lipitor 40 mg daily and aspirin 81 mg daily. -He is following up with his PCP.  PCP aware of high blood pressure saw him a week and a half ago.   Orders Placed This Encounter  Procedures  . Lactate dehydrogenase    Standing Status:   Future  Standing Expiration  Date:   08/22/2020  . CBC with Differential/Platelet    Standing Status:   Future    Standing Expiration Date:   08/22/2020  . Comprehensive metabolic panel    Standing Status:   Future    Standing Expiration Date:   08/22/2020  . Ferritin    Standing Status:   Future    Standing Expiration Date:   08/22/2020  . Iron and TIBC    Standing Status:   Future    Standing Expiration Date:   08/22/2020  . Vitamin B12    Standing Status:   Future    Standing Expiration Date:   08/22/2020  . VITAMIN D 25 Hydroxy (Vit-D Deficiency, Fractures)    Standing Status:   Future    Standing Expiration Date:   08/22/2020  . Folate    Standing Status:   Future    Standing Expiration Date:   08/22/2020    All questions were answered. The patient knows to call the clinic with any problems, questions or concerns. We can certainly see the patient much sooner if necessary. This note was electronically signed.  I provided 29 minutes of non face-to-face telephone visit time during this encounter, and > 50% was spent counseling as documented under my assessment & plan.   Glennie Isle, NP-C 08/23/2019

## 2019-08-23 NOTE — Assessment & Plan Note (Signed)
1.  Iron deficiency anemia: -Likely due to CKD. -Last Feraheme infusion was on 07/12/2019, 07/19/2019, 07/26/2019 -He denies any bright red bleeding per rectum or melena. -Labs done on 08/14/2019 showed his hemoglobin 9.4, ferritin 64, percent saturation 9 -He wants to try oral iron pills.  We will call him in a prescription -He will follow up in 6 weeks with repeat labs.  2.  Locally advanced basal cell carcinoma of the right nasolabial fold: -Biopsy of the right nasal mucosa on 04/05/2017 showed basal cell carcinoma. -Biopsy of the left cheek lesion on 04/02/2017 was consistent with basal cell carcinoma. -He was initially evaluated by Dr. Blenda Nicely at [facility] and then was referred to Dr. Mardee Postin at [facility]. -He underwent resection with skin graft on 06/06/2017.  The margins was close.  Rest of the margins were negative.  He was presented at tumor board at [facility].  Recommendation was not to give any adjuvant therapy.  Overall he is very happy with the results. -He follows up with his doctors at [facility]. -He has a small area in the corner of his right eye that has come up.  Looks like it has been scratched and is scabbed over. -He was sent to dermatology to follow-up with [place].  3.  Uncontrolled hypertension: -Patient started back on his Coreg 12.5 mg twice daily.   -She will also continue Lipitor 40 mg daily and aspirin 81 mg daily. -He is following up with his PCP.  PCP aware of high blood pressure saw him a week and a half ago.

## 2019-08-28 ENCOUNTER — Ambulatory Visit (HOSPITAL_COMMUNITY): Payer: Medicare Other | Admitting: Nurse Practitioner

## 2019-09-26 ENCOUNTER — Ambulatory Visit: Payer: Medicare Other | Admitting: Vascular Surgery

## 2019-10-02 ENCOUNTER — Other Ambulatory Visit: Payer: Self-pay

## 2019-10-02 ENCOUNTER — Inpatient Hospital Stay (HOSPITAL_COMMUNITY): Payer: Medicare Other | Attending: Hematology

## 2019-10-02 DIAGNOSIS — D509 Iron deficiency anemia, unspecified: Secondary | ICD-10-CM | POA: Insufficient documentation

## 2019-10-02 DIAGNOSIS — D508 Other iron deficiency anemias: Secondary | ICD-10-CM

## 2019-10-02 LAB — CBC WITH DIFFERENTIAL/PLATELET
Abs Immature Granulocytes: 0.06 10*3/uL (ref 0.00–0.07)
Basophils Absolute: 0.1 10*3/uL (ref 0.0–0.1)
Basophils Relative: 1 %
Eosinophils Absolute: 0.4 10*3/uL (ref 0.0–0.5)
Eosinophils Relative: 4 %
HCT: 25.9 % — ABNORMAL LOW (ref 39.0–52.0)
Hemoglobin: 7.4 g/dL — ABNORMAL LOW (ref 13.0–17.0)
Immature Granulocytes: 1 %
Lymphocytes Relative: 24 %
Lymphs Abs: 2.6 10*3/uL (ref 0.7–4.0)
MCH: 22.5 pg — ABNORMAL LOW (ref 26.0–34.0)
MCHC: 28.6 g/dL — ABNORMAL LOW (ref 30.0–36.0)
MCV: 78.7 fL — ABNORMAL LOW (ref 80.0–100.0)
Monocytes Absolute: 0.8 10*3/uL (ref 0.1–1.0)
Monocytes Relative: 8 %
Neutro Abs: 6.7 10*3/uL (ref 1.7–7.7)
Neutrophils Relative %: 62 %
Platelets: 505 10*3/uL — ABNORMAL HIGH (ref 150–400)
RBC: 3.29 MIL/uL — ABNORMAL LOW (ref 4.22–5.81)
RDW: 17.2 % — ABNORMAL HIGH (ref 11.5–15.5)
WBC: 10.7 10*3/uL — ABNORMAL HIGH (ref 4.0–10.5)
nRBC: 0 % (ref 0.0–0.2)

## 2019-10-02 LAB — LACTATE DEHYDROGENASE: LDH: 87 U/L — ABNORMAL LOW (ref 98–192)

## 2019-10-02 LAB — COMPREHENSIVE METABOLIC PANEL
ALT: 13 U/L (ref 0–44)
AST: 18 U/L (ref 15–41)
Albumin: 3.7 g/dL (ref 3.5–5.0)
Alkaline Phosphatase: 136 U/L — ABNORMAL HIGH (ref 38–126)
Anion gap: 13 (ref 5–15)
BUN: 31 mg/dL — ABNORMAL HIGH (ref 8–23)
CO2: 23 mmol/L (ref 22–32)
Calcium: 9.1 mg/dL (ref 8.9–10.3)
Chloride: 104 mmol/L (ref 98–111)
Creatinine, Ser: 2.24 mg/dL — ABNORMAL HIGH (ref 0.61–1.24)
GFR calc Af Amer: 34 mL/min — ABNORMAL LOW (ref >60)
GFR calc non Af Amer: 30 mL/min — ABNORMAL LOW (ref >60)
Glucose, Bld: 121 mg/dL — ABNORMAL HIGH (ref 70–99)
Potassium: 3.6 mmol/L (ref 3.5–5.1)
Sodium: 140 mmol/L (ref 135–145)
Total Bilirubin: 0.5 mg/dL (ref 0.3–1.2)
Total Protein: 8 g/dL (ref 6.5–8.1)

## 2019-10-02 LAB — IRON AND TIBC
Iron: 21 ug/dL — ABNORMAL LOW (ref 45–182)
Saturation Ratios: 4 % — ABNORMAL LOW (ref 17.9–39.5)
TIBC: 500 ug/dL — ABNORMAL HIGH (ref 250–450)
UIBC: 479 ug/dL

## 2019-10-02 LAB — VITAMIN B12: Vitamin B-12: 487 pg/mL (ref 180–914)

## 2019-10-02 LAB — FERRITIN: Ferritin: 5 ng/mL — ABNORMAL LOW (ref 24–336)

## 2019-10-02 LAB — FOLATE: Folate: 6.8 ng/mL (ref >5.9)

## 2019-10-02 LAB — VITAMIN D 25 HYDROXY (VIT D DEFICIENCY, FRACTURES): Vit D, 25-Hydroxy: 55.66 ng/mL (ref 30–100)

## 2019-10-08 ENCOUNTER — Inpatient Hospital Stay (HOSPITAL_COMMUNITY): Payer: Medicare Other | Attending: Hematology | Admitting: Nurse Practitioner

## 2019-10-08 ENCOUNTER — Other Ambulatory Visit: Payer: Self-pay

## 2019-10-08 DIAGNOSIS — D508 Other iron deficiency anemias: Secondary | ICD-10-CM | POA: Diagnosis not present

## 2019-10-08 NOTE — Progress Notes (Signed)
[facility] Follow up:    [name], NP [address] [place] [address]   DIAGNOSIS: Iron deficiency anemia  CURRENT THERAPY: Intermittent iron infusions  INTERVAL HISTORY: Michael [name] 64 y.o. male was called for telephone visit for iron deficiency anemia.  Patient denies any bright red bleeding per rectum or melena.  He denies any easy bruising or bleeding.  He does report he can tell his hemoglobin is low due to his dizziness.  He denies any chest pain. Denies any nausea, vomiting, or diarrhea. Denies any new pains. Had not noticed any recent bleeding such as epistaxis, hematuria or hematochezia. Denies recent chest pain on exertion, shortness of breath on minimal exertion, pre-syncopal episodes, or palpitations. Denies any numbness or tingling in hands or feet. Denies any recent fevers, infections, or recent hospitalizations. Patient reports appetite at 100% and energy level at 25%.  He is eating well maintain his weight this time.    Patient Active Problem List   Diagnosis Date Noted   Elevated PSA 06/19/2019   Benign prostatic hyperplasia with urinary obstruction 10/29/2018   CAP (community acquired pneumonia) 05/09/2018   Hypertensive emergency 05/09/2018   Acute on chronic combined systolic and diastolic CHF (congestive heart failure) ([place]) 05/09/2018   Lobar pneumonia ([place]) 05/09/2018   Acute on chronic combined systolic and diastolic congestive heart failure ([place]) 05/09/2018   Urinary retention 11/27/2017   Abnormal nuclear stress test 02/72/5366   Complicated UTI (urinary tract infection)    Sepsis ([place]) 09/17/2017   Vasovagal syncope 09/17/2017   Sepsis secondary to UTI ([place]) 09/01/2017   Syncope 07/05/2017   COPD exacerbation ([place]) 07/05/2017   Acute renal failure superimposed on chronic kidney disease ([place]) 07/05/2017   Catheter-associated urinary tract infection ([place]) 07/05/2017   Leukocytosis 07/05/2017    Urinary retention with incomplete bladder emptying 07/05/2017   UTI (urinary tract infection) 07/05/2017   Diarrhea 07/05/2017   Carotid artery disease ([place]) 06/25/2017   AKI (acute kidney injury) ([place])    Orthostatic syncope    Syncope due to orthostatic hypotension 06/24/2017   Heart murmur 06/24/2017   Abnormal EKG 06/24/2017   Abnormal PET scan of colon 05/25/2017   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 05/15/2017   Periapical abscess 04/04/2017   Iron (Fe) deficiency anemia 04/04/2017   Essential hypertension 04/02/2017   Anemia 04/02/2017   Acute hypoxemic respiratory failure ([place]) 04/02/2017   Tobacco abuse 04/02/2017   Chronic diastolic CHF (congestive heart failure) ([place]) 04/02/2017   Elevated troponin 04/02/2017   Thrombocytosis ([place]) 04/02/2017   Skin lesion of face    Nasal lesion     has No Known Allergies.  MEDICAL HISTORY: Past Medical History:  Diagnosis Date   Carotid artery disease (HCC)    Chronic diastolic heart failure (HCC)    CKD (chronic kidney disease) stage 3, GFR 30-59 ml/min    COPD (chronic obstructive pulmonary disease) (HCC)    Coronary artery disease    Essential hypertension    Head and neck cancer ([place]) 2019   Right facial basal cell carcinoma s/p resection with right partial mastectomy and partal rhinectomy with skin graft (06/13/17)   Iron deficiency anemia    Urinary retention     SURGICAL HISTORY: Past Surgical History:  Procedure Laterality Date   ANKLE CLOSED REDUCTION Right    open reduction   BASAL CELL CARCINOMA EXCISION  [facility]   [facility]   COLONOSCOPY N/A 05/31/2017   Procedure:  COLONOSCOPY;  Surgeon: Daneil Dolin, MD;  Location[facility];  Service: Endoscopy;  Laterality: N/A;  2:45pm   CYSTOSCOPY N/A 10/29/2018   Procedure: CYSTOSCOPY, CLOT EVACUATION;  Surgeon: Ceasar Mons, MD;  Location[facility];  Service: Urology;  Laterality: N/A;   POLYPECTOMY  05/31/2017    Procedure: POLYPECTOMY;  Surgeon: Daneil Dolin, MD;  [facility];  Service: Endoscopy;;   TONSILLECTOMY     XI ROBOTIC ASSISTED SIMPLE PROSTATECTOMY N/A 10/29/2018   Procedure: XI ROBOTIC ASSISTED SIMPLE PROSTATECTOMY;  Surgeon: Cleon Gustin, MD;  Location: [facility];  Service: Urology;  Laterality: N/A;    SOCIAL HISTORY: Social History   Socioeconomic History   Marital status: Married    Spouse name: Not on file   Number of children: Not on file   Years of education: Not on file   Highest education level: Not on file  Occupational History   Not on file  Tobacco Use   Smoking status: Current Every Day Smoker    Packs/day: 0.50    Years: 40.00    Pack years: 20.00    Types: Cigarettes   Smokeless tobacco: Never Used  Vaping Use   Vaping Use: Never used  Substance and Sexual Activity   Alcohol use: Not Currently   Drug use: Never   Sexual activity: Not on file  Other Topics Concern   Not on file  Social History Narrative   Not on file   Social Determinants of Health   Financial Resource Strain:    Difficulty of Paying Living Expenses: Not on file  Food Insecurity:    Worried About [place] in the Last Year: Not on file   Ran Out of Food in the Last Year: Not on file  Transportation Needs:    Lack of Transportation (Medical): Not on file   Lack of Transportation (Non-Medical): Not on file  Physical Activity:    Days of Exercise per Week: Not on file   Minutes of Exercise per Session: Not on file  Stress:    Feeling of Stress : Not on file  Social Connections:    Frequency of Communication with Friends and Family: Not on file   Frequency of Social Gatherings with Friends and Family: Not on file   Attends Religious Services: Not on file   Active Member of Clubs or Organizations: Not on file   Attends Archivist Meetings: Not on file   Marital Status: Not on file  Intimate Partner Violence:     Fear of Current or Ex-Partner: Not on file   Emotionally Abused: Not on file   Physically Abused: Not on file   Sexually Abused: Not on file    FAMILY HISTORY: Family History  Problem Relation Age of Onset   Stroke Father    Cirrhosis Mother    Colon cancer Neg Hx     Review of Systems  Constitutional: Positive for fatigue.  Neurological: Positive for dizziness.  All other systems reviewed and are negative.    Vital signs: -Deferred to telephone visit  Physical Exam -Deferred due to telephone visit -Patient was alert and oriented over the phone and in no acute distress   LABORATORY DATA:  CBC    Component Value Date/Time   WBC 10.7 (H) 10/02/2019 1001   RBC 3.29 (L) 10/02/2019 1001   HGB 7.4 (L) 10/02/2019 1001   HGB 13.8 04/23/2011 0919   HCT 25.9 (L) 10/02/2019 1001   HCT  26.0 (L) 05/09/2018 0455   PLT 505 (H) 10/02/2019 1001   PLT 208 04/23/2011 0919   MCV 78.7 (L) 10/02/2019 1001   MCV 84 04/23/2011 0919   MCH 22.5 (L) 10/02/2019 1001   MCHC 28.6 (L) 10/02/2019 1001   RDW 17.2 (H) 10/02/2019 1001   RDW 12.3 04/23/2011 0919   LYMPHSABS 2.6 10/02/2019 1001   LYMPHSABS 1.1 04/23/2011 0919   MONOABS 0.8 10/02/2019 1001   MONOABS 0.9 (H) 04/23/2011 0919   EOSABS 0.4 10/02/2019 1001   EOSABS 0.0 04/23/2011 0919   BASOSABS 0.1 10/02/2019 1001   BASOSABS 0.0 04/23/2011 0919    CMP     Component Value Date/Time   NA 140 10/02/2019 1001   NA 140 04/23/2011 0919   K 3.6 10/02/2019 1001   K 2.4 (LL) 04/23/2011 0919   CL 104 10/02/2019 1001   CL 100 04/23/2011 0919   CO2 23 10/02/2019 1001   CO2 28 04/23/2011 0919   GLUCOSE 121 (H) 10/02/2019 1001   GLUCOSE 101 (H) 04/23/2011 0919   BUN 31 (H) 10/02/2019 1001   BUN 17 04/23/2011 0919   CREATININE 2.24 (H) 10/02/2019 1001   CREATININE 1.18 04/23/2011 0919   CALCIUM 9.1 10/02/2019 1001   CALCIUM 8.4 (L) 04/23/2011 0919   PROT 8.0 10/02/2019 1001   PROT 7.3 04/23/2011 0919   ALBUMIN 3.7  10/02/2019 1001   ALBUMIN 3.2 (L) 04/23/2011 0919   AST 18 10/02/2019 1001   AST 64 (H) 04/23/2011 0919   ALT 13 10/02/2019 1001   ALT 70 04/23/2011 0919   ALKPHOS 136 (H) 10/02/2019 1001   ALKPHOS 62 04/23/2011 0919   BILITOT 0.5 10/02/2019 1001   BILITOT 1.0 04/23/2011 0919   GFRNONAA 30 (L) 10/02/2019 1001   GFRNONAA >60 04/23/2011 0919   GFRAA 34 (L) 10/02/2019 1001   GFRAA >60 04/23/2011 0919   All questions were answered to patient's stated satisfaction. Encouraged patient to call with any new concerns or questions before his next visit to the cancer center and we can certain see him sooner, if needed.     ASSESSMENT and THERAPY PLAN:   Iron (Fe) deficiency anemia 1.  Iron deficiency anemia: -Likely due to CKD. -Last Feraheme infusion was on 07/12/2019, 07/19/2019, 07/26/2019 -He denies any bright red bleeding per rectum or melena. -Patient tried oral iron therapy and was unable to tolerate it due to constipation and abdominal pains. -Labs done on 10/02/2019 showed hemoglobin dropped to 7.4, ferritin 5, percent saturation 4, and platelets 505 -We will set him up for 3 iron infusions 1 week apart.  We will try to get him on a every 6-week schedule for iron infusions after this. -He will follow up in 8 weeks with repeat labs.  2.  Locally advanced basal cell carcinoma of the right nasolabial fold: -Biopsy of the right nasal mucosa on 04/05/2017 showed basal cell carcinoma. -Biopsy of the left cheek lesion on 04/02/2017 was consistent with basal cell carcinoma. -He was initially evaluated by Dr. Blenda Nicely at [facility] and then was referred to Dr. Mardee Postin at [facility]. -He underwent resection with skin graft on 06/06/2017.  The margins was close.  Rest of the margins were negative.  He was presented at tumor board at [facility].  Recommendation was not to give any adjuvant therapy.  Overall he is very happy with the results. -He follows up with his doctors at [facility]. -He has a small area in  the corner of his right eye that  has come up.  Looks like it has been scratched and is scabbed over. -He was sent to dermatology to follow-up with this.  3.  Uncontrolled hypertension: -Patient started back on his Coreg 12.5 mg twice daily.   -She will also continue Lipitor 40 mg daily and aspirin 81 mg daily. -He is following up with his PCP.  PCP aware of high blood pressure saw him a week and a half ago.   No orders of the defined types were placed in this encounter.   All questions were answered. The patient knows to call the clinic with any problems, questions or concerns. We can certainly see the patient much sooner if necessary. This note was electronically signed.  I provided 28 minutes of non face-to-face telephone visit time during this encounter, and > 50% was spent counseling as documented under my assessment & plan.   Glennie Isle, NP-C 10/08/2019

## 2019-10-08 NOTE — Assessment & Plan Note (Addendum)
1.  Iron deficiency anemia: -Likely due to CKD. -Last Feraheme infusion was on 07/12/2019, 07/19/2019, 07/26/2019 -He denies any bright red bleeding per rectum or melena. -Patient tried oral iron therapy and was unable to tolerate it due to constipation and abdominal pains. -Labs done on 10/02/2019 showed hemoglobin dropped to 7.4, ferritin 5, percent saturation 4, and platelets 505 -We will set him up for 3 iron infusions 1 week apart.  We will try to get him on a every 6-week schedule for iron infusions after this. -He will follow up in 8 weeks with repeat labs.  2.  Locally advanced basal cell carcinoma of the right nasolabial fold: -Biopsy of the right nasal mucosa on 04/05/2017 showed basal cell carcinoma. -Biopsy of the left cheek lesion on 04/02/2017 was consistent with basal cell carcinoma. -He was initially evaluated by Dr. Blenda Nicely at [facility] and then was referred to Dr. Mardee Postin at [facility]. -He underwent resection with skin graft on 06/06/2017.  The margins was close.  Rest of the margins were negative.  He was presented at tumor board at [facility].  Recommendation was not to give any adjuvant therapy.  Overall he is very happy with the results. -He follows up with his doctors at [facility]. -He has a small area in the corner of his right eye that has come up.  Looks like it has been scratched and is scabbed over. -He was sent to dermatology to follow-up with this.  3.  Uncontrolled hypertension: -Patient started back on his Coreg 12.5 mg twice daily.   -She will also continue Lipitor 40 mg daily and aspirin 81 mg daily. -He is following up with his PCP.  PCP aware of high blood pressure saw him a week and a half ago.

## 2019-10-10 ENCOUNTER — Other Ambulatory Visit: Payer: Self-pay

## 2019-10-10 ENCOUNTER — Inpatient Hospital Stay (HOSPITAL_COMMUNITY): Payer: Medicare Other

## 2019-10-10 ENCOUNTER — Encounter (HOSPITAL_COMMUNITY): Payer: Self-pay

## 2019-10-10 ENCOUNTER — Other Ambulatory Visit (HOSPITAL_COMMUNITY): Payer: Self-pay | Admitting: Nurse Practitioner

## 2019-10-10 VITALS — BP 123/50 | HR 74 | Temp 97.7°F | Resp 18

## 2019-10-10 DIAGNOSIS — D509 Iron deficiency anemia, unspecified: Secondary | ICD-10-CM | POA: Diagnosis not present

## 2019-10-10 DIAGNOSIS — D500 Iron deficiency anemia secondary to blood loss (chronic): Secondary | ICD-10-CM

## 2019-10-10 MED ORDER — SODIUM CHLORIDE 0.9 % IV SOLN
510.0000 mg | Freq: Once | INTRAVENOUS | Status: AC
  Administered 2019-10-10: 510 mg via INTRAVENOUS
  Filled 2019-10-10: qty 510

## 2019-10-10 MED ORDER — SODIUM CHLORIDE 0.9 % IV SOLN: Freq: Once | INTRAVENOUS | Status: AC

## 2019-10-10 MED ORDER — SODIUM CHLORIDE 0.9% FLUSH: 10.0000 mL | Freq: Once | INTRAVENOUS | Status: DC | PRN

## 2019-10-10 NOTE — Progress Notes (Signed)
Tolerated iron infusion well today without incidence. Discharged ambulatory.

## 2019-10-10 NOTE — Patient Instructions (Signed)
[facility] at [facility]  Discharge Instructions:   _______________________________________________________________  Thank you for choosing [facility] at [facility] to provide your oncology and hematology care.  To afford each patient quality time with our providers, please arrive at least 15 minutes before your scheduled appointment.  You need to re-schedule your appointment if you arrive 10 or more minutes late.  We strive to give you quality time with our providers, and arriving late affects you and other patients whose appointments are after yours.  Also, if you no show three or more times for appointments you may be dismissed from the clinic.  Again, thank you for choosing [facility] at [facility]. Our hope is that these requests will allow you access to exceptional care and in a timely manner. _______________________________________________________________  If you have questions after your visit, please contact our office at [phone] between the hours of 8:30 a.m. and 5:00 p.m. Voicemails left after 4:30 p.m. will not be returned until the following business day. _______________________________________________________________  For prescription refill requests, have your pharmacy contact our office. _______________________________________________________________  Recommendations made by the consultant and any test results will be sent to your referring physician. _______________________________________________________________ 

## 2019-10-17 ENCOUNTER — Inpatient Hospital Stay (HOSPITAL_COMMUNITY): Payer: Medicare Other

## 2019-10-17 ENCOUNTER — Other Ambulatory Visit: Payer: Self-pay

## 2019-10-17 VITALS — BP 139/60 | HR 60 | Temp 97.5°F | Resp 18

## 2019-10-17 DIAGNOSIS — D500 Iron deficiency anemia secondary to blood loss (chronic): Secondary | ICD-10-CM

## 2019-10-17 DIAGNOSIS — D509 Iron deficiency anemia, unspecified: Secondary | ICD-10-CM | POA: Diagnosis not present

## 2019-10-17 MED ORDER — SODIUM CHLORIDE 0.9 % IV SOLN: INTRAVENOUS | Status: DC

## 2019-10-17 MED ORDER — SODIUM CHLORIDE 0.9 % IV SOLN
510.0000 mg | Freq: Once | INTRAVENOUS | Status: AC
  Administered 2019-10-17: 510 mg via INTRAVENOUS
  Filled 2019-10-17: qty 510

## 2019-10-17 NOTE — Progress Notes (Signed)
Michael [name] presents today for IV iron infusion. Infusion tolerated without incident or complaint. See MAR for details. VSS prior to and post infusion. Discharged in satisfactory condition with follow up instructions.

## 2019-10-17 NOTE — Patient Instructions (Signed)
[facility] at [facility]  Discharge Instructions:  Ferumoxytol injection What is this medicine? FERUMOXYTOL is an iron complex. Iron is used to make healthy red blood cells, which carry oxygen and nutrients throughout the body. This medicine is used to treat iron deficiency anemia. This medicine may be used for other purposes; ask your health care provider or pharmacist if you have questions. COMMON BRAND NAME(S): Feraheme What should I tell my health care provider before I take this medicine? They need to know if you have any of these conditions:  anemia not caused by low iron levels  high levels of iron in the blood  magnetic resonance imaging (MRI) test scheduled  an unusual or allergic reaction to iron, other medicines, foods, dyes, or preservatives  pregnant or trying to get pregnant  breast-feeding How should I use this medicine? This medicine is for injection into a vein. It is given by a health care professional in a hospital or clinic setting. Talk to your pediatrician regarding the use of this medicine in children. Special care may be needed. Overdosage: If you think you have taken too much of this medicine contact a poison control center or emergency room at once. NOTE: This medicine is only for you. Do not share this medicine with others. What if I miss a dose? It is important not to miss your dose. Call your doctor or health care professional if you are unable to keep an appointment. What may interact with this medicine? This medicine may interact with the following medications:  other iron products This list may not describe all possible interactions. Give your health care provider a list of all the medicines, herbs, non-prescription drugs, or dietary supplements you use. Also tell them if you smoke, drink alcohol, or use illegal drugs. Some items may interact with your medicine. What should I watch for while using this medicine? Visit your  doctor or healthcare professional regularly. Tell your doctor or healthcare professional if your symptoms do not start to get better or if they get worse. You may need blood work done while you are taking this medicine. You may need to follow a special diet. Talk to your doctor. Foods that contain iron include: whole grains/cereals, dried fruits, beans, or peas, leafy green vegetables, and organ meats (liver, kidney). What side effects may I notice from receiving this medicine? Side effects that you should report to your doctor or health care professional as soon as possible:  allergic reactions like skin rash, itching or hives, swelling of the face, lips, or tongue  breathing problems  changes in blood pressure  feeling faint or lightheaded, falls  fever or chills  flushing, sweating, or hot feelings  swelling of the ankles or feet Side effects that usually do not require medical attention (report to your doctor or health care professional if they continue or are bothersome):  diarrhea  headache  nausea, vomiting  stomach pain This list may not describe all possible side effects. Call your doctor for medical advice about side effects. You may report side effects to FDA at 1-800-FDA-1088. Where should I keep my medicine? This drug is given in a hospital or clinic and will not be stored at home. NOTE: This sheet is a summary. It may not cover all possible information. If you have questions about this medicine, talk to your doctor, pharmacist, or health care provider.  2020 Elsevier/Gold Standard (2016-03-07 20:21:10)  _____________________________________________________________  Thank you for choosing [facility] at   [facility] to provide your oncology and hematology care.  To afford each patient quality time with our providers, please arrive at least 15 minutes before your scheduled appointment.  You need to re-schedule your appointment if you arrive 10 or  more minutes late.  We strive to give you quality time with our providers, and arriving late affects you and other patients whose appointments are after yours.  Also, if you no show three or more times for appointments you may be dismissed from the clinic.  Again, thank you for choosing [facility] at [facility]. Our hope is that these requests will allow you access to exceptional care and in a timely manner. _______________________________________________________________  If you have questions after your visit, please contact our office at [phone] between the hours of 8:30 a.m. and 5:00 p.m. Voicemails left after 4:30 p.m. will not be returned until the following business day. _______________________________________________________________  For prescription refill requests, have your pharmacy contact our office. _______________________________________________________________  Recommendations made by the consultant and any test results will be sent to your referring physician. _______________________________________________________________ 

## 2019-10-24 ENCOUNTER — Inpatient Hospital Stay (HOSPITAL_COMMUNITY): Payer: Medicare Other

## 2019-10-24 ENCOUNTER — Other Ambulatory Visit: Payer: Self-pay

## 2019-10-24 VITALS — BP 165/71 | HR 64 | Temp 97.3°F | Resp 18

## 2019-10-24 DIAGNOSIS — D500 Iron deficiency anemia secondary to blood loss (chronic): Secondary | ICD-10-CM

## 2019-10-24 DIAGNOSIS — D509 Iron deficiency anemia, unspecified: Secondary | ICD-10-CM | POA: Diagnosis not present

## 2019-10-24 MED ORDER — SODIUM CHLORIDE 0.9 % IV SOLN: INTRAVENOUS | Status: DC

## 2019-10-24 MED ORDER — SODIUM CHLORIDE 0.9 % IV SOLN
510.0000 mg | Freq: Once | INTRAVENOUS | Status: AC
  Administered 2019-10-24: 510 mg via INTRAVENOUS
  Filled 2019-10-24: qty 510

## 2019-10-24 NOTE — Patient Instructions (Signed)
[facility] at [facility]  Discharge Instructions:   _______________________________________________________________  Thank you for choosing [facility] at [facility] to provide your oncology and hematology care.  To afford each patient quality time with our providers, please arrive at least 15 minutes before your scheduled appointment.  You need to re-schedule your appointment if you arrive 10 or more minutes late.  We strive to give you quality time with our providers, and arriving late affects you and other patients whose appointments are after yours.  Also, if you no show three or more times for appointments you may be dismissed from the clinic.  Again, thank you for choosing [facility] at [facility]. Our hope is that these requests will allow you access to exceptional care and in a timely manner. _______________________________________________________________  If you have questions after your visit, please contact our office at [phone] between the hours of 8:30 a.m. and 5:00 p.m. Voicemails left after 4:30 p.m. will not be returned until the following business day. _______________________________________________________________  For prescription refill requests, have your pharmacy contact our office. _______________________________________________________________  Recommendations made by the consultant and any test results will be sent to your referring physician. _______________________________________________________________ 

## 2019-10-24 NOTE — Progress Notes (Signed)
Iron infusion given per orders. Patient tolerated it well without problems. Vitals stable and discharged home from clinic ambulatory in stable condition. Follow up as scheduled.  

## 2019-11-01 ENCOUNTER — Telehealth: Payer: Self-pay | Admitting: Urology

## 2019-11-01 NOTE — Telephone Encounter (Signed)
Pt called and said that he has 2 growths on his stomach where McKenzie did surgery. Please call pt when you can.

## 2019-11-01 NOTE — Telephone Encounter (Signed)
Pt has what he calls " 2 growths" under site you did sx on. I asked what he meant by growths. He said one is shaped like a circle and the other feels like a knot. I asked if there was any redness, pus etc. He said no. He said only thing bothering him about them was his stomach is swollen. Pls. Advise.

## 2019-11-05 NOTE — Telephone Encounter (Signed)
Have him see me

## 2019-11-05 NOTE — Telephone Encounter (Signed)
Pt scheduled  

## 2019-11-06 ENCOUNTER — Other Ambulatory Visit: Payer: Self-pay

## 2019-11-06 ENCOUNTER — Ambulatory Visit (INDEPENDENT_AMBULATORY_CARE_PROVIDER_SITE_OTHER): Payer: Medicare Other | Admitting: Urology

## 2019-11-06 ENCOUNTER — Encounter: Payer: Self-pay | Admitting: Urology

## 2019-11-06 VITALS — BP 194/101 | HR 84 | Temp 97.6°F | Ht 72.0 in | Wt 180.0 lb

## 2019-11-06 DIAGNOSIS — N401 Enlarged prostate with lower urinary tract symptoms: Secondary | ICD-10-CM

## 2019-11-06 DIAGNOSIS — K432 Incisional hernia without obstruction or gangrene: Secondary | ICD-10-CM | POA: Diagnosis not present

## 2019-11-06 DIAGNOSIS — N138 Other obstructive and reflux uropathy: Secondary | ICD-10-CM | POA: Diagnosis not present

## 2019-11-06 LAB — MICROSCOPIC EXAMINATION
Renal Epithel, UA: NONE SEEN /hpf
WBC, UA: >30 /hpf — AB (ref 0–5)

## 2019-11-06 LAB — URINALYSIS, ROUTINE W REFLEX MICROSCOPIC
Bilirubin, UA: NEGATIVE
Glucose, UA: NEGATIVE
Ketones, UA: NEGATIVE
Nitrite, UA: POSITIVE — AB
Specific Gravity, UA: 1.02 (ref 1.005–1.030)
Urobilinogen, Ur: 1 mg/dL (ref 0.2–1.0)
pH, UA: 5.5 (ref 5.0–7.5)

## 2019-11-06 NOTE — Progress Notes (Signed)

## 2019-11-06 NOTE — Progress Notes (Signed)
11/06/2019 2:56 PM   Michael [name] 1955-04-19 [id number]  Referring provider: [name], NP [place],  [place] [address]  abdominal bulge  HPI: Michael [name] is a 64yo here for followup for BPH. Since last visit he has no new LUTS. Stream strong. No dysuria or hematuria. Nocturia 0x. NO hesitancy. No urge incontincnec. He has noticed a bulge 2 weeks ago at his midline incision site. No abdominal pain, no nausea/vomiting   PMH: Past Medical History:  Diagnosis Date  . Carotid artery disease ([place])   . Chronic diastolic heart failure ([place])   . CKD (chronic kidney disease) stage 3, GFR 30-59 ml/min (H[facility])   . COPD (chronic obstructive pulmonary disease) ([place])   . Coronary artery disease   . Essential hypertension   . Head and neck cancer ([place]) 2019   Right facial basal cell carcinoma s/p resection with right partial mastectomy and partal rhinectomy with skin graft (06/13/17)  . Iron deficiency anemia   . Urinary retention     Surgical History: Past Surgical History:  Procedure Laterality Date  . ANKLE CLOSED REDUCTION Right    open reduction  . BASAL CELL CARCINOMA EXCISION  2019   at [facility]  . COLONOSCOPY N/A 05/31/2017   Procedure: COLONOSCOPY;  Surgeon: Daneil Dolin, MD;  Location: [facility];  Service: Endoscopy;  Laterality: N/A;  2:45pm  . CYSTOSCOPY N/A 10/29/2018   Procedure: Jilda Panda;  Surgeon: Ceasar Mons, MD;  Location: [facility] ORS;  Service: Urology;  Laterality: N/A;  . POLYPECTOMY  05/31/2017   Procedure: POLYPECTOMY;  Surgeon: Daneil Dolin, MD;  Location: [facility];  Service: Endoscopy;;  . TONSILLECTOMY    . XI ROBOTIC ASSISTED SIMPLE PROSTATECTOMY N/A 10/29/2018   Procedure: XI ROBOTIC ASSISTED SIMPLE PROSTATECTOMY;  Surgeon: Cleon Gustin, MD;  Location: [facility] OR[facility];  Service: Urology;  Laterality: N/A;    Home Medications:  Allergies as of 11/06/2019   No Known Allergies     Medication List         Accurate as of November 06, 2019  2:56 PM. If you have any questions, ask your nurse or doctor.        aspirin EC 81 MG tablet Take 81 mg by mouth daily.   atorvastatin 40 MG tablet Commonly known as: LIPITOR Take 1 tablet (40 mg total) by mouth every evening.   ergocalciferol 1.25 MG (50000 UT) capsule Commonly known as: VITAMIN D2 Take 1 capsule (50,000 Units total) by mouth once a week.   ferrous sulfate 325 (65 FE) MG EC tablet Take 1 tablet (325 mg total) by mouth daily with breakfast.   furosemide 40 MG tablet Commonly known as: LASIX Take 1 tablet (40 mg total) by mouth every evening.   isosorbide mononitrate 30 MG 24 hr tablet Commonly known as: IMDUR Take 1 tablet (30 mg total) by mouth every evening.   lisinopril 5 MG tablet Commonly known as: ZESTRIL Take 1 tablet (5 mg total) by mouth daily for 30 days. What changed: when to take this   lisinopril 10 MG tablet Commonly known as: ZESTRIL Take 10 mg by mouth daily. What changed: Another medication with the same name was changed. Make sure you understand how and when to take each.   metoprolol succinate 25 MG 24 hr tablet Commonly known as: TOPROL-XL Take 0.5 tablets (12.5 mg total) by mouth every evening.   nitroGLYCERIN 0.4 MG SL tablet Commonly known as: NITROSTAT  Place 1 tablet (0.4 mg total) under the tongue every 5 (five) minutes as needed for chest pain.   oxybutynin 5 MG tablet Commonly known as: DITROPAN Take 1 tablet (5 mg total) by mouth 3 (three) times daily.   temazepam 15 MG capsule Commonly known as: RESTORIL Take 1 capsule (15 mg total) by mouth at bedtime as needed for sleep.       Allergies: No Known Allergies  Family History: Family History  Problem Relation Age of Onset  . Stroke Father   . Cirrhosis Mother   . Colon cancer Neg Hx     Social History:  reports that he has been smoking cigarettes. He has a 20.00 pack-year smoking history. He has never used smokeless tobacco.  He reports previous alcohol use. He reports that he does not use drugs.  ROS: All other review of systems were reviewed and are negative except what is noted above in HPI  Physical Exam: BP (!) 194/101   Pulse 84   Temp 97.6 F (36.4 C)   Ht 6' (1.829 m)   Wt 180 lb (81.6 kg)   BMI 24.41 kg/m   Constitutional:  Alert and oriented, No acute distress. HEENT: [place] AT, moist mucus membranes.  Trachea midline, no masses. Cardiovascular: No clubbing, cyanosis, or edema. Respiratory: Normal respiratory effort, no increased work of breathing. GI: Abdomen is soft, nontender, nondistended, no abdominal masses GU: No CVA tenderness.  Lymph: No cervical or inguinal lymphadenopathy. Skin: No rashes, bruises or suspicious lesions. Neurologic: Grossly intact, no focal deficits, moving all 4 extremities. Psychiatric: Normal mood and affect.  Laboratory Data: Lab Results  Component Value Date   WBC 10.7 (H) 10/02/2019   HGB 7.4 (L) 10/02/2019   HCT 25.9 (L) 10/02/2019   MCV 78.7 (L) 10/02/2019   PLT 505 (H) 10/02/2019    Lab Results  Component Value Date   CREATININE 2.24 (H) 10/02/2019    Lab Results  Component Value Date   PSA 1.3 06/19/2019    No results found for: TESTOSTERONE  Lab Results  Component Value Date   HGBA1C 5.3 04/02/2017    Urinalysis    Component Value Date/Time   COLORURINE AMBER (A) 06/22/2018 1340   APPEARANCEUR Clear 11/06/2019 1356   LABSPEC 1.015 06/22/2018 1340   LABSPEC 1.018 04/23/2011 1044   PHURINE 8.0 06/22/2018 1340   GLUCOSEU Negative 11/06/2019 1356   GLUCOSEU Negative 04/23/2011 1044   HGBUR NEGATIVE 06/22/2018 1340   BILIRUBINUR Negative 11/06/2019 1356   BILIRUBINUR Negative 04/23/2011 [place] 06/22/2018 1340   PROTEINUR 2+ (A) 11/06/2019 1356   PROTEINUR 100 (A) 06/22/2018 1340   NITRITE Positive (A) 11/06/2019 1356   NITRITE NEGATIVE 06/22/2018 1340   LEUKOCYTESUR 1+ (A) 11/06/2019 1356   LEUKOCYTESUR LARGE  (A) 06/22/2018 1340   LEUKOCYTESUR Trace 04/23/2011 1044    Lab Results  Component Value Date   LABMICR See below: 11/06/2019   WBCUA >30 (A) 11/06/2019   LABEPIT 0-10 11/06/2019   BACTERIA Many (A) 11/06/2019    Pertinent Imaging:  No results found for this or any previous visit.  No results found for this or any previous visit.  No results found for this or any previous visit.  No results found for this or any previous visit.  Results for orders placed during the hospital encounter of 04/01/17  US RENAL  Narrative CLINICAL DATA:  Acute kidney injury  EXAM: RENAL / URINARY TRACT ULTRASOUND COMPLETE  COMPARISON:  None.  FINDINGS:  Right Kidney:  Length: 10 cm. Echogenicity within normal limits. 1.6 x 1.2 x 1.2 cm hypoechoic right renal mass without internal Doppler flow, posterior acoustic enhancement or posterior acoustic shadowing. No hydronephrosis visualized.  Left Kidney:  Length: 14.6 cm. Echogenicity within normal limits. No mass or hydronephrosis visualized.  Bladder:  Appears normal for degree of bladder distention.  IMPRESSION: 1. No obstructive uropathy. 2. 1.6 x 1.2 x 1.2 cm hypoechoic right renal mass of indeterminate etiology. Appearance is likely to reflect a mildly complicated cyst. Recommend nonemergent MRI of the abdomen for better characterization.   Electronically Signed By: Kathreen Devoid On: 04/02/2017 12:31  No results found for this or any previous visit.  Results for orders placed during the hospital encounter of 07/30/18  CT HEMATURIA WORKUP  Narrative CLINICAL DATA:  History of urolithiasis including bladder stone. Reported hematuria. Elevated PSA. Upcoming prostate biopsy.  EXAM: CT ABDOMEN AND PELVIS WITHOUT AND WITH CONTRAST  TECHNIQUE: Multidetector CT imaging of the abdomen and pelvis was performed following the standard protocol before and following the bolus administration of intravenous  contrast.  CONTRAST:  172mL ISOVUE-370 IOPAMIDOL (ISOVUE-370) INJECTION 76%  COMPARISON:  None.  FINDINGS: Lower chest: Tiny calcified granulomas in the basilar right lower lobe. Coronary atherosclerosis.  Hepatobiliary: Normal liver size. Simple 1.4 cm right liver dome cyst. A few additional scattered subcentimeter hypodense liver lesions, too small to characterize, which require no follow-up unless the patient has risk factors for liver malignancy. Cholelithiasis. No biliary ductal dilatation.  Pancreas: Normal, with no mass or duct dilation.  Spleen: Normal size. No mass.  Adrenals/Urinary Tract: Normal adrenals. No renal stones. Mild right hydroureteronephrosis to the level of the right ureterovesical junction. No ureteral stones. Normal caliber left ureter. No left hydronephrosis. Asymmetric moderate right renal parenchymal atrophy. Normal size left kidney. Partially duplicated left renal collecting system to the level of the mid left pelvic ureter. Smooth urothelial wall thickening and enhancement throughout the right renal collecting system and right ureter. No left urinary tract urothelial wall thickening. No discrete filling defects in the bilateral collecting system or ureters. Nonspecific diffuse bladder wall thickening. Solitary 3.0 cm posterior left bladder stone. No bladder diverticula. Mass-effect on the bladder base by the enlarged nodular median lobe of the prostate. Otherwise no focal bladder mass.  Stomach/Bowel: Normal non-distended stomach. Normal caliber small bowel with no small bowel wall thickening. Normal appendix. Normal large bowel with no diverticulosis, large bowel wall thickening or pericolonic fat stranding.  Vascular/Lymphatic: Atherosclerotic abdominal aorta with 3.3 cm infrarenal abdominal aortic aneurysm. Patent portal, splenic, hepatic and renal veins. Retroaortic left renal vein. Enlarged 1.7 cm right common iliac node (series 4/image  49). Enlarged 1.3 cm left common iliac node (series 4/image 49). Mild porta hepatis adenopathy up to 1.4 cm (series 4/image 16).  Reproductive: Markedly enlarged prostate measuring 8.5 x 7.4 x 7.9 cm (258 cc).  Other: No pneumoperitoneum, ascites or focal fluid collection. Tiny fat containing umbilical hernia.  Musculoskeletal: No aggressive appearing focal osseous lesions. Mild thoracolumbar spondylosis.  IMPRESSION: 1. Markedly enlarged prostate with mass-effect on the bladder base by the enlarged nodular median lobe of the prostate. 2. Solitary large 3 cm bladder stone.  No renal or ureteral stones. 3. Mild right hydroureteronephrosis to the level of the right UVJ. Nonspecific smooth diffuse urothelial wall thickening and enhancement throughout the right renal collecting system and right ureter, more likely inflammatory. Asymmetric moderate right renal parenchymal atrophy suggests a longstanding process. Obstruction may be due to the  nodular median lobe of the prostate, which slightly asymmetrically involves the right bladder base. 4. Mild porta hepatis and bilateral common iliac lymphadenopathy, nonspecific. Suggest attention on follow-up CT abdomen/pelvis with oral and IV contrast in 3 months. This recommendation follows ACR consensus guidelines: White Paper of the ACR Incidental Findings Committee II on Splenic and Nodal Findings. J Am Coll Radiol 9150;56:979-480. 5. Infrarenal 3.3 cm Abdominal Aortic Aneurysm (ICD10-I71.9). Recommend follow-up aortic ultrasound in 3 years. This recommendation follows ACR consensus guidelines: White Paper of the ACR Incidental Findings Committee II on Vascular Findings. J Am Coll Radiol 2013; 10:789-794. 6.  Aortic Atherosclerosis (ICD10-I70.0).   Electronically Signed By: Ilona Sorrel M.D. On: 07/30/2018 10:33  No results found for this or any previous visit.   Assessment & Plan:    1. Benign prostatic hyperplasia without urinary  obstruction -patient doing well after simple prostatectomy - Urinalysis, Routine w reflex microscopic  2. Incision hernia -Referral to Dr. Arnoldo Morale   No follow-ups on file.  Nicolette Bang, MD  [facility] [place]

## 2019-11-07 ENCOUNTER — Ambulatory Visit (INDEPENDENT_AMBULATORY_CARE_PROVIDER_SITE_OTHER): Payer: Medicare Other | Admitting: General Surgery

## 2019-11-07 ENCOUNTER — Encounter: Payer: Self-pay | Admitting: General Surgery

## 2019-11-07 VITALS — BP 117/68 | HR 69 | Temp 98.0°F | Resp 14 | Ht 72.0 in | Wt 169.0 lb

## 2019-11-07 DIAGNOSIS — K432 Incisional hernia without obstruction or gangrene: Secondary | ICD-10-CM | POA: Diagnosis not present

## 2019-11-07 NOTE — Patient Instructions (Signed)
Ventral Hernia  A ventral hernia is a bulge of tissue from inside the abdomen that pushes through a weak area of the muscles that form the front wall of the abdomen. The tissues inside the abdomen are inside a sac (peritoneum). These tissues include the small intestine, large intestine, and the fatty tissue that covers the intestines (omentum). Sometimes, the bulge that forms a hernia contains intestines. Other hernias contain only fat. Ventral hernias do not go away without surgical treatment. There are several types of ventral hernias. You may have:  A hernia at an incision site from previous abdominal surgery (incisional hernia).  A hernia just above the belly button (epigastric hernia), or at the belly button (umbilical hernia). These types of hernias can develop from heavy lifting or straining.  A hernia that comes and goes (reducible hernia). It may be visible only when you lift or strain. This type of hernia can be pushed back into the abdomen (reduced).  A hernia that traps abdominal tissue inside the hernia (incarcerated hernia). This type of hernia does not reduce.  A hernia that cuts off blood flow to the tissues inside the hernia (strangulated hernia). The tissues can start to die if this happens. This is a very painful bulge that cannot be reduced. A strangulated hernia is a medical emergency. What are the causes? This condition is caused by abdominal tissue putting pressure on an area of weakness in the abdominal muscles. What increases the risk? The following factors may make you more likely to develop this condition:  Being male.  Being 60 or older.  Being overweight or obese.  Having had previous abdominal surgery, especially if there was an infection after surgery.  Having had an injury to the abdominal wall.  Having had several pregnancies.  Having a buildup of fluid inside the abdomen (ascites). What are the signs or symptoms? The only symptom of a ventral hernia  may be a painless bulge in the abdomen. A reducible hernia may be visible only when you strain, cough, or lift. Other symptoms may include:  Dull pain.  A feeling of pressure. Signs and symptoms of a strangulated hernia may include:  Increasing pain.  Nausea and vomiting.  Pain when pressing on the hernia.  The skin over the hernia turning red or purple.  Constipation.  Blood in the stool (feces). How is this diagnosed? This condition may be diagnosed based on:  Your symptoms.  Your medical history.  A physical exam. You may be asked to cough or strain while standing. These actions increase the pressure inside your abdomen and force the hernia through the opening in your muscles. Your health care provider may try to reduce the hernia by pressing on it.  Imaging studies, such as an ultrasound or CT scan. How is this treated? This condition is treated with surgery. If you have a strangulated hernia, surgery is done as soon as possible. If your hernia is small and not incarcerated, you may be asked to lose some weight before surgery. Follow these instructions at home:  Follow instructions from your health care provider about eating or drinking restrictions.  If you are overweight, your health care provider may recommend that you increase your activity level and eat a healthier diet.  Do not lift anything that is heavier than 10 lb (4.5 kg).  Return to your normal activities as told by your health care provider. Ask your health care provider what activities are safe for you. You may need to avoid activities   that increase pressure on your hernia.  Take over-the-counter and prescription medicines only as told by your health care provider.  Keep all follow-up visits as told by your health care provider. This is important. Contact a health care provider if:  Your hernia gets larger.  Your hernia becomes painful. Get help right away if:  Your hernia becomes increasingly  painful.  You have pain along with any of the following: ? Changes in skin color in the area of the hernia. ? Nausea. ? Vomiting. ? Fever. Summary  A ventral hernia is a bulge of tissue from inside the abdomen that pushes through a weak area of the muscles that form the front wall of the abdomen.  This condition is treated with surgery, which may be urgent depending on your hernia.  Do not lift anything that is heavier than 10 lb (4.5 kg), and follow activity instructions from your health care provider. This information is not intended to replace advice given to you by your health care provider. Make sure you discuss any questions you have with your health care provider. Document Revised: 03/01/2017 Document Reviewed: 08/08/2016 Elsevier Patient Education  2020 [employer] Inc[employer]  

## 2019-11-07 NOTE — Progress Notes (Signed)
[name]; [id number]; [birth year]   HPI Patient is a 64 year old white male who was referred to my care by Dr. Alyson Ingles for evaluation and treatment of incisional hernia.  He underwent robotic surgery in 2020 and recently noticed increasing swelling at a surgical site in the periumbilical region.  It does not cause him discomfort.  He states he just wanted it evaluated as per Dr. Alyson Ingles.  He denies any nausea or vomiting. Past Medical History:  Diagnosis Date   Carotid artery disease (HCC)    Chronic diastolic heart failure (HCC)    CKD (chronic kidney disease) stage 3, GFR 30-59 ml/min (HCC)    COPD (chronic obstructive pulmonary disease) (HCC)    Coronary artery disease    Essential hypertension    Head and neck cancer ([place]) 2019   Right facial basal cell carcinoma s/p resection with right partial mastectomy and partal rhinectomy with skin graft (06/13/17)   Iron deficiency anemia    Urinary retention     Past Surgical History:  Procedure Laterality Date   ANKLE CLOSED REDUCTION Right    open reduction   BASAL CELL CARCINOMA EXCISION  2019   at [facility]   COLONOSCOPY N/A 05/31/2017   Procedure: COLONOSCOPY;  Surgeon: Daneil Dolin, MD;  [facility];  Service: Endoscopy;  Laterality: N/A;  2:45pm   CYSTOSCOPY N/A 10/29/2018   Procedure: CYSTOSCOPY, CLOT EVACUATION;  Surgeon: Ceasar Mons, MD;  Location: [facility];  Service: Urology;  Laterality: N/A;   POLYPECTOMY  05/31/2017   Procedure: POLYPECTOMY;  Surgeon: Daneil Dolin, MD;  [facility];  Service: Endoscopy;;   TONSILLECTOMY     XI ROBOTIC ASSISTED SIMPLE PROSTATECTOMY N/A 10/29/2018   Procedure: XI ROBOTIC ASSISTED SIMPLE PROSTATECTOMY;  Surgeon: Cleon Gustin, MD;  Location: [facility];  Service: Urology;  Laterality: N/A;    Family History  Problem Relation Age of Onset   Stroke Father    Cirrhosis Mother    Colon cancer Neg Hx     Current Outpatient Medications  on File Prior to Visit  Medication Sig Dispense Refill   aspirin EC 81 MG tablet Take 81 mg by mouth daily.     atorvastatin (LIPITOR) 40 MG tablet Take 1 tablet (40 mg total) by mouth every evening. 90 tablet 3   ergocalciferol (VITAMIN D2) 1.25 MG (50000 UT) capsule Take 1 capsule (50,000 Units total) by mouth once a week. 16 capsule 3   ferrous sulfate 325 (65 FE) MG EC tablet Take 1 tablet (325 mg total) by mouth daily with breakfast. 30 tablet 3   furosemide (LASIX) 40 MG tablet Take 1 tablet (40 mg total) by mouth every evening. 90 tablet 3   isosorbide mononitrate (IMDUR) 30 MG 24 hr tablet Take 1 tablet (30 mg total) by mouth every evening. 90 tablet 3   lisinopril (ZESTRIL) 10 MG tablet Take 10 mg by mouth daily.     metoprolol succinate (TOPROL-XL) 25 MG 24 hr tablet Take 0.5 tablets (12.5 mg total) by mouth every evening. 45 tablet 3   oxybutynin (DITROPAN) 5 MG tablet Take 1 tablet (5 mg total) by mouth 3 (three) times daily. 30 tablet 0   temazepam (RESTORIL) 15 MG capsule Take 1 capsule (15 mg total) by mouth at bedtime as needed for sleep. 30 capsule 0   lisinopril (PRINIVIL,ZESTRIL) 5 MG tablet Take 1 tablet (5 mg total) by mouth daily for 30 days. (Patient taking differently: Take 5 mg by mouth every  evening. ) 30 tablet 0   nitroGLYCERIN (NITROSTAT) 0.4 MG SL tablet Place 1 tablet (0.4 mg total) under the tongue every 5 (five) minutes as needed for chest pain. 90 tablet 3   No current facility-administered medications on file prior to visit.    No Known Allergies  Social History   Substance and Sexual Activity  Alcohol Use Not Currently    Social History   Tobacco Use  Smoking Status Current Every Day Smoker   Packs/day: 0.50   Years: 40.00   Pack years: 20.00   Types: Cigarettes  Smokeless Tobacco Never Used    Review of Systems  Constitutional: Negative.   HENT: Negative.   Eyes: Negative.   Respiratory: Negative.   Cardiovascular:  Negative.   Gastrointestinal: Negative.   Genitourinary: Negative.   Musculoskeletal: Negative.   Skin: Negative.   Neurological: Negative.   Endo/Heme/Allergies: Negative.   Psychiatric/Behavioral: Negative.     Objective   Vitals:   11/07/19 0859  BP: 117/68  Pulse: 69  Resp: 14  Temp: 98 F (36.7 C)  SpO2: 97%    Physical Exam Vitals reviewed.  Constitutional:      Appearance: Normal appearance. He is not ill-appearing.  HENT:     Head: Normocephalic and atraumatic.  Cardiovascular:     Rate and Rhythm: Normal rate and regular rhythm.     Heart sounds: Normal heart sounds. No murmur heard.  No friction rub. No gallop.   Pulmonary:     Effort: Pulmonary effort is normal. No respiratory distress.     Breath sounds: Normal breath sounds. No stridor. No wheezing, rhonchi or rales.  Abdominal:     General: Abdomen is flat. Bowel sounds are normal. There is no distension.     Palpations: Abdomen is soft. There is no mass.     Tenderness: There is no abdominal tenderness. There is no guarding or rebound.     Hernia: A hernia is present.     Comments: A small reducible incisional hernia is noted in the supraumbilical region.  Skin:    General: Skin is warm and dry.  Neurological:     Mental Status: He is alert and oriented to person, place, and time.     Assessment  Incisional hernia, asymptomatic Plan   I told the patient that I would be more than happy to repair the hernia if it becomes more symptomatic.  The signs and symptoms of incarceration was explained to the patient.  He would like to delay any surgery at this point, which is fine with me.  He will follow-up with me should he decide to have his hernia repaired.

## 2019-11-15 ENCOUNTER — Encounter: Payer: Self-pay | Admitting: Urology

## 2019-11-15 DIAGNOSIS — K432 Incisional hernia without obstruction or gangrene: Secondary | ICD-10-CM | POA: Insufficient documentation

## 2019-11-15 NOTE — Patient Instructions (Signed)

## 2019-11-25 ENCOUNTER — Other Ambulatory Visit: Payer: Self-pay

## 2019-11-25 ENCOUNTER — Encounter: Payer: Self-pay | Admitting: Plastic Surgery

## 2019-11-25 ENCOUNTER — Ambulatory Visit (INDEPENDENT_AMBULATORY_CARE_PROVIDER_SITE_OTHER): Payer: Medicare Other | Admitting: Plastic Surgery

## 2019-11-25 VITALS — BP 121/76 | HR 114 | Temp 98.3°F | Ht 72.0 in | Wt 163.2 lb

## 2019-11-25 DIAGNOSIS — C44311 Basal cell carcinoma of skin of nose: Secondary | ICD-10-CM | POA: Diagnosis not present

## 2019-11-25 NOTE — Progress Notes (Signed)
Referring Provider Michael Squibb, MD [address] [place] [name],  [place] [address]   CC:  Chief Complaint  Patient presents with  . Consult      [name] is an 64 y.o. male.  HPI: Patient presents with a recurrent basal cell carcinoma near the right medial canthus.  He was initially treated for this over 2 years ago at [name].  This involved quite extensive resection followed by coverage with a free radial forearm flap.  He reportedly had a close margin but no adjuvant radiation was given at that time.  He has now developed biopsy-proven recurrence in the superior aspect.  He was sent to me by his Mohs surgeon to discuss coordinated care.  Patient has no symptoms related to that area at this time.  I believe he has had prostate cancer in the past that was treated surgically.  He is followed by vascular surgery for occlusive carotid disease and is recently been found to have kidney disease requiring iron infusions.  He has lost about 40 pounds in the past year.  He feels that [name] is too far to go for him at this time.  No Known Allergies  Outpatient Encounter Medications as of 11/25/2019  Medication Sig  . aspirin EC 81 MG tablet Take 81 mg by mouth daily.  Marland Kitchen atorvastatin (LIPITOR) 40 MG tablet Take 1 tablet (40 mg total) by mouth every evening.  . ergocalciferol (VITAMIN D2) 1.25 MG (50000 UT) capsule Take 1 capsule (50,000 Units total) by mouth once a week.  . ferrous sulfate 325 (65 FE) MG EC tablet Take 1 tablet (325 mg total) by mouth daily with breakfast.  . furosemide (LASIX) 40 MG tablet Take 1 tablet (40 mg total) by mouth every evening.  . isosorbide mononitrate (IMDUR) 30 MG 24 hr tablet Take 1 tablet (30 mg total) by mouth every evening.  Marland Kitchen lisinopril (ZESTRIL) 10 MG tablet Take 10 mg by mouth daily.  . metoprolol succinate (TOPROL-XL) 25 MG 24 hr tablet Take 0.5 tablets (12.5 mg total) by mouth every evening.  Marland Kitchen oxybutynin (DITROPAN) 5 MG tablet Take 1 tablet (5 mg total) by  mouth 3 (three) times daily.  . temazepam (RESTORIL) 15 MG capsule Take 1 capsule (15 mg total) by mouth at bedtime as needed for sleep.  Marland Kitchen lisinopril (PRINIVIL,ZESTRIL) 5 MG tablet Take 1 tablet (5 mg total) by mouth daily for 30 days. (Patient taking differently: Take 5 mg by mouth every evening. )  . nitroGLYCERIN (NITROSTAT) 0.4 MG SL tablet Place 1 tablet (0.4 mg total) under the tongue every 5 (five) minutes as needed for chest pain.   No facility-administered encounter medications on file as of 11/25/2019.     Past Medical History:  Diagnosis Date  . Carotid artery disease ([name])   . Chronic diastolic heart failure ([name])   . CKD (chronic kidney disease) stage 3, GFR 30-59 ml/min ([name])   . COPD (chronic obstructive pulmonary disease) ([name])   . Coronary artery disease   . Essential hypertension   . Head and neck cancer ([name]) 2019   Right facial basal cell carcinoma s/p resection with right partial mastectomy and partal rhinectomy with skin graft (06/13/17)  . Iron deficiency anemia   . Urinary retention     Past Surgical History:  Procedure Laterality Date  . ANKLE CLOSED REDUCTION Right    open reduction  . BASAL CELL CARCINOMA EXCISION  2019   at [facility]  . COLONOSCOPY N/A 05/31/2017  Procedure: COLONOSCOPY;  Surgeon: [facility] Dolin, MD;  Location: [name];  Service: Endoscopy;  Laterality: N/A;  2:45pm  . CYSTOSCOPY N/A 10/29/2018   Procedure: [name];  Surgeon: Michael Mons, MD;  Location: [name];  Service: Urology;  Laterality: N/A;  . POLYPECTOMY  05/31/2017   Procedure: POLYPECTOMY;  Surgeon: [facility] Dolin, MD;  Location: [name];  Service: Endoscopy;;  . TONSILLECTOMY    . XI ROBOTIC ASSISTED SIMPLE PROSTATECTOMY N/A 10/29/2018   Procedure: XI ROBOTIC ASSISTED SIMPLE PROSTATECTOMY;  Surgeon: [facility] Gustin, MD;  Location: [name];  Service: Urology;  Laterality: N/A;    Family History  Problem Relation Age of Onset  .  Stroke Father   . Cirrhosis Mother   . Colon cancer Neg Hx     Social History   Social History Narrative  . Not on file  Current every day smoker  Review of Systems General: Denies fevers, chills, weight loss CV: Denies chest pain, shortness of breath, palpitations  Physical Exam Vitals with BMI 11/25/2019 11/07/2019 11/06/2019  Height 6\' 0"  6\' 0"  6\' 0"   Weight 163 lbs 3 oz 169 lbs 180 lbs  BMI 22.13 60.45 40.98  Systolic 119 147 829  Diastolic 76 68 562  Pulse 114 69 84    General:  No acute distress,  Alert and oriented, Non-Toxic, Normal speech and affect Examination shows at least a 1 cm area of ulceration in the right medial canthus.  This is just superior to the skin paddle the radial forearm flap.  It looks like about half of his nose was removed on the right side and covered with the flap.  He currently has no evidence of scleral irritation or tearing.  He has no scars across his forehead that are obvious to me.  He has a well-healed donor site in the left forearm from the radial forearm flap.  Assessment/Plan Patient presents with a recurrent basal cell carcinoma in the area of the right medial canthus.  This complex situation.  I discussed a number of options with the patient including radiation and aggressive surgery.  He was initially hesitant to undergo aggressive surgery but I expect he would require the assistance of an oculoplastic surgeon for what I would anticipate would be lacrimal reconstruction that would be required.  The coverage might also be difficult but could potentially be performed with a forehead flap if necessary.  He does have a number of medical comorbidities that would place him at higher risk for involved surgical reconstruction.  After discussing with his Mohs surgeon we are going to refer the patient for a consultation with radiation oncology so that he gets that side of things and can weigh his options.  [name] 11/25/2019, 10:16 AM

## 2019-12-02 NOTE — Progress Notes (Signed)
Histology and Location of Primary Skin Cancer: 09/18/2019   [name] presented with the following signs/symptoms: recurrent basal cell carcinoma near the right medial canthus  Past/Anticipated interventions by patient's surgeon/dermatologist for current problematic lesion, if any: 11/25/2019 Dr. Mingo Amber (Plastic Surgeon) I discussed a number of options with the patient including radiation and aggressive surgery.  He was initially hesitant to undergo aggressive surgery but I expect he would require the assistance of an oculoplastic surgeon for what I would anticipate would be lacrimal reconstruction that would be required.  The coverage might also be difficult but could potentially be performed with a forehead flap if necessary.  He does have a number of medical comorbidities that would place him at higher risk for involved surgical reconstruction.  After discussing with his Mohs surgeon we are going to refer the patient for a consultation with radiation oncology so that he gets that side of things and can weigh his options  11/14/2019 Dr. Karin Golden (Dermatologist)   Past skin cancers, if any: Patient denies   History of Blistering sunburns, if any: Patient denies  SAFETY ISSUES:  Prior radiation? No  Pacemaker/ICD? No  Possible current pregnancy? N/A--male  Is the patient on methotrexate? No  Current Complaints / other details:   Date of Surgery: 06/06/2017 --Procedures performed: Performed by Dr. Lanny Cramp: [facility] Neck Surgery) 1. Resection of left intranasal lesion, CPT 61600 2. Right partial maxillectomy, CPT [id number] 3. Right partial rhinectomy, CPT 30150 --Performed by Dr. Vallarie Mare: [facility]/Head and Neck Surgery) 1. Wide local excision of an extensive left facial lesion, CPT 11643 2. Adjacent tissue transfer of the face, CPT 14041 --Performed by Dr. Tiburcio Pea: [facility] and Neck Surgical Oncology) 1. Left radial  forearm osteocutaneous free flap, CPT 15757 2. Right neck exploration, CPT 35701 3. Split thickness skin graft from left thigh,CPT 05110

## 2019-12-02 NOTE — Progress Notes (Signed)
Oncology Nurse Navigator Documentation  Placed introductory call to new referral patient [name].  Introduced myself as the H&N oncology nurse navigator that works with Dr. Isidore Moos. to whom he has been referred by Dr. Claudia Desanctis. He confirmed understanding of referral.  Briefly explained my role as his navigator, provided my contact information.   Confirmed understanding of upcoming appts and that he would be receiving a telephone call from Dr. Pearlie Oyster nurse, Althia Forts and Dr. Isidore Moos tomorrow starting at 10:30.     I encouraged him to call with questions/concerns as he moves forward with appts and procedures.    He verbalized understanding of information provided, expressed appreciation for my call.    Harlow Asa RN, BSN, OCN Head & Neck Oncology Nurse [place] [facility] Phone # [phone]  Fax # [phone]

## 2019-12-02 NOTE — Progress Notes (Signed)
Cardiology Office Note  Date: 12/03/2019   ID: Michael [name], DOB September 30, 1955, MRN [id number]  PCP:  [name], MD  Cardiologist:  Rozann Lesches, MD Electrophysiologist:  None   Chief Complaint  Patient presents with  . Cardiac follow-up    History of Present Illness: Michael [name] is a 64 y.o. male last seen in May.  He presents for a routine follow-up visit.  Reports no angina symptoms or increasing shortness of breath with typical activities.  Also no leg swelling, orthopnea or PND.  His weight is stable.  He did have follow-up with Dr. Oneida Alar in July, I reviewed the note.  Carotid CT deferred due to renal insufficiency, his last creatinine was 2.24.  He has not had follow-up with Dr. Nevada Crane, no repeat lab work or change in medications.  He is also following with plastic surgery, has recurrent basal cell carcinoma in the region of the right medial canthus.  I reviewed his medications which are outlined below.  We are checking with pharmacy to clarify correct dose of lisinopril.  I asked him to stop Lasix for the time being to see if this leads to any improvement in his renal function, he does not describe any obvious fluid retention.  I personally reviewed his ECG which shows sinus rhythm with prolonged PR interval, inferior/anterolateral ST-T wave abnormalities that are chronic.  Past Medical History:  Diagnosis Date  . Carotid artery disease ([place])   . Chronic diastolic heart failure ([place])   . CKD (chronic kidney disease) stage 3, GFR 30-59 ml/min (HCC)   . COPD (chronic obstructive pulmonary disease) ([name])   . Coronary artery disease   . Essential hypertension   . Head and neck cancer ([place]) 2019   Right facial basal cell carcinoma s/p resection with right partial mastectomy and partal rhinectomy with skin graft (06/13/17)  . Iron deficiency anemia   . Urinary retention     Past Surgical History:  Procedure Laterality Date  . ANKLE CLOSED REDUCTION Right    open  reduction  . BASAL CELL CARCINOMA EXCISION  2019   at [facility]  . COLONOSCOPY N/A 05/31/2017   Procedure: COLONOSCOPY;  Surgeon: Daneil Dolin, MD;  Location[facility];  Service: Endoscopy;  Laterality: N/A;  2:45pm  . CYSTOSCOPY N/A 10/29/2018   Procedure: Jilda Panda;  Surgeon: Ceasar Mons, MD;  Location: [facility];  Service: Urology;  Laterality: N/A;  . POLYPECTOMY  05/31/2017   Procedure: POLYPECTOMY;  Surgeon: Daneil Dolin, MD;  Location: [facility];  Service: Endoscopy;;  . TONSILLECTOMY    . XI ROBOTIC ASSISTED SIMPLE PROSTATECTOMY N/A 10/29/2018   Procedure: XI ROBOTIC ASSISTED SIMPLE PROSTATECTOMY;  Surgeon: Cleon Gustin, MD;  Location: [facility];  Service: Urology;  Laterality: N/A;    Current Outpatient Medications  Medication Sig Dispense Refill  . aspirin EC 81 MG tablet Take 81 mg by mouth daily.    Marland Kitchen atorvastatin (LIPITOR) 40 MG tablet Take 1 tablet (40 mg total) by mouth every evening. 90 tablet 3  . ergocalciferol (VITAMIN D2) 1.25 MG (50000 UT) capsule Take 1 capsule (50,000 Units total) by mouth once a week. 16 capsule 3  . ferrous sulfate 325 (65 FE) MG EC tablet Take 1 tablet (325 mg total) by mouth daily with breakfast. 30 tablet 3  . furosemide (LASIX) 40 MG tablet Take 1 tablet (40 mg total) by mouth daily as needed (leg swelling). 30 tablet 2  . isosorbide mononitrate (  IMDUR) 30 MG 24 hr tablet Take 1 tablet (30 mg total) by mouth every evening. 90 tablet 3  . lisinopril (ZESTRIL) 10 MG tablet Take 10 mg by mouth daily.    . metoprolol succinate (TOPROL-XL) 25 MG 24 hr tablet Take 0.5 tablets (12.5 mg total) by mouth every evening. 45 tablet 3  . nitroGLYCERIN (NITROSTAT) 0.4 MG SL tablet Place 1 tablet (0.4 mg total) under the tongue every 5 (five) minutes as needed for chest pain. 90 tablet 3  . oxybutynin (DITROPAN) 5 MG tablet Take 1 tablet (5 mg total) by mouth 3 (three) times daily. 30 tablet 0  . temazepam (RESTORIL) 15 MG  capsule Take 1 capsule (15 mg total) by mouth at bedtime as needed for sleep. 30 capsule 0   No current facility-administered medications for this visit.   Allergies:  Patient has no known allergies.   ROS: No dizziness or syncope.  Physical Exam: VS:  BP (!) 152/78   Pulse 75   Ht 6' (1.829 m)   Wt 164 lb (74.4 kg)   SpO2 99%   BMI 22.24 kg/m , BMI Body mass index is 22.24 kg/m.  Wt Readings from Last 3 Encounters:  12/03/19 164 lb (74.4 kg)  11/25/19 163 lb 3.2 oz (74 kg)  11/07/19 169 lb (76.7 kg)    General: Patient appears comfortable at rest. HEENT: Right facial scars from prior ENT surgery, wearing a mask.  Neck: Supple, no elevated JVP, soft carotid bruits. Lungs: Clear to auscultation, nonlabored breathing at rest. Cardiac: Regular rate and rhythm, no S3, soft systolic murmur. Abdomen: Soft, nontender, bowel sounds present. Extremities: No pitting edema, distal pulses 2+.  ECG:  An ECG dated 10/25/2018 was personally reviewed today and demonstrated:  Sinus rhythm with prolonged PR interval, possible old anterior infarct pattern, frequent PVCs.  Recent Labwork: 10/02/2019: ALT 13; AST 18; BUN 31; Creatinine, Ser 2.24; Hemoglobin 7.4; Platelets 505; Potassium 3.6; Sodium 140     Component Value Date/Time   CHOL 194 04/02/2017 0541   TRIG 49 04/02/2017 0541   HDL 42 04/02/2017 0541   CHOLHDL 4.6 04/02/2017 0541   VLDL 10 04/02/2017 0541   LDLCALC 142 (H) 04/02/2017 0541    Other Studies Reviewed Today:  Echocardiogram 12/05/2017: Study Conclusions  - Left ventricle: The cavity size was normal. Wall thickness was increased in a pattern of mild LVH. Systolic function was mildly reduced. The estimated ejection fraction was in the range of 45% to 50%. There is hypokinesis of the basal-midinferolateral myocardium. Features are consistent with a pseudonormal left ventricular filling pattern, with concomitant abnormal relaxation and increased filling  pressure (grade 2 diastolic dysfunction). - Aortic valve: Mildly calcified annulus. Trileaflet; mildly calcified leaflets. - Mitral valve: Mildly calcified annulus. There was mild regurgitation. - Left atrium: The atrium was mildly dilated. - Tricuspid valve: There was trivial regurgitation. - Pulmonary arteries: Systolic pressure could not be accurately estimated. - Pericardium, extracardiac: A trivial pericardial effusion was identified anterior to the heart.  Lexiscan Myoview 10/19/2017:  Findings consistent with prior inferior/septal myocardial infarction with mild peri-infarct ischemia.  This is a high risk study. Risk primarily based on decreased LVEF. There is a prior infarct with only mild current ischemia. Consider correlating LVEF with echocardiogram.  The left ventricular ejection fraction is moderately decreased (33%).  Carotid Dopplers 08/01/2019: Summary:  Right Carotid: Velocities in the right ICA are consistent with a 80-99%         stenosis. The ECA appears >50%  stenosed.   Left Carotid: Velocities in the left ICA are consistent with a 1-39%  stenosis.   Vertebrals: Bilateral vertebral arteries demonstrate antegrade flow.  Subclavians: Normal flow hemodynamics were seen in bilateral subclavian        arteries.   Assessment and Plan:  1.  Ischemic heart disease based on noninvasive cardiac imaging studies.  Myoview from 2019 showed inferior/septal scar with mild peri-infarct ischemia, echocardiogram at that time revealed LVEF 45 to 50% with corresponding wall motion abnormality.  We have managed him medically in the absence of progressive angina symptoms.  Continue aspirin and Lipitor.  We are clarifying dose of lisinopril, this may need to be further reduced if renal function does not improve off Lasix.  Continue Toprol-XL and Imdur otherwise.  2.  Chronic kidney disease, last creatinine 2.24, suspect CKD stage IIIb with acute interval  insufficiency.  We are stopping Lasix completely. Check BMET in 7 to 10 days.  May need to further reduce or stop ACE inhibitor.  Can refer to nephrology if trend does not improve.  3.  Bilateral carotid artery disease, severe RICA stenosis and mild LICA stenosis.  This has been evaluated by Dr. Oneida Alar with [facility].  Carotid CTA deferred due to degree of renal insufficiency.  He remains on aspirin and Lipitor.  4.  Essential hypertension, blood pressure elevated today with systolic in the 709G.  He reports compliance with his medications.  No changes made today.  If he needs to come off of ACE inhibitor, we may try replacing it with Norvasc.  Medication Adjustments/Labs and Tests Ordered: Current medicines are reviewed at length with the patient today.  Concerns regarding medicines are outlined above.   Tests Ordered: Orders Placed This Encounter  Procedures  . Basic metabolic panel  . EKG 12-Lead    Medication Changes: Meds ordered this encounter  Medications  . furosemide (LASIX) 40 MG tablet    Sig: Take 1 tablet (40 mg total) by mouth daily as needed (leg swelling).    Dispense:  30 tablet    Refill:  2    12/03/2019 stop daily dose--change in directions    Disposition:  Follow up 3 months in the [place] office.  Signed, Satira Sark, MD, [facility] 12/03/2019 8:43 AM    [facility] [place], [place], [place] [address] Phone: [phone]; Fax: [phone]

## 2019-12-03 ENCOUNTER — Ambulatory Visit
Admission: RE | Admit: 2019-12-03 | Discharge: 2019-12-03 | Disposition: A | Discharge status: Home / Self Care | Payer: Self-pay | Source: Ambulatory Visit | Attending: Radiation Oncology | Admitting: Radiation Oncology

## 2019-12-03 ENCOUNTER — Ambulatory Visit
Admission: RE | Admit: 2019-12-03 | Discharge: 2019-12-03 | Disposition: A | Discharge status: Home / Self Care | Payer: Medicare Other | Source: Ambulatory Visit | Attending: Radiation Oncology | Admitting: Radiation Oncology

## 2019-12-03 ENCOUNTER — Other Ambulatory Visit: Payer: Self-pay

## 2019-12-03 ENCOUNTER — Encounter: Payer: Self-pay | Admitting: Cardiology

## 2019-12-03 ENCOUNTER — Ambulatory Visit (INDEPENDENT_AMBULATORY_CARE_PROVIDER_SITE_OTHER): Payer: Medicare Other | Admitting: Cardiology

## 2019-12-03 ENCOUNTER — Other Ambulatory Visit (HOSPITAL_COMMUNITY): Payer: Self-pay

## 2019-12-03 ENCOUNTER — Encounter: Payer: Self-pay | Admitting: Radiation Oncology

## 2019-12-03 VITALS — BP 152/78 | HR 75 | Ht 72.0 in | Wt 164.0 lb

## 2019-12-03 DIAGNOSIS — I259 Chronic ischemic heart disease, unspecified: Secondary | ICD-10-CM | POA: Diagnosis not present

## 2019-12-03 DIAGNOSIS — C44311 Basal cell carcinoma of skin of nose: Secondary | ICD-10-CM

## 2019-12-03 DIAGNOSIS — I10 Essential (primary) hypertension: Secondary | ICD-10-CM | POA: Diagnosis not present

## 2019-12-03 DIAGNOSIS — E559 Vitamin D deficiency, unspecified: Secondary | ICD-10-CM

## 2019-12-03 DIAGNOSIS — N1832 Chronic kidney disease, stage 3b: Secondary | ICD-10-CM | POA: Diagnosis not present

## 2019-12-03 DIAGNOSIS — I6523 Occlusion and stenosis of bilateral carotid arteries: Secondary | ICD-10-CM | POA: Diagnosis not present

## 2019-12-03 DIAGNOSIS — D500 Iron deficiency anemia secondary to blood loss (chronic): Secondary | ICD-10-CM

## 2019-12-03 DIAGNOSIS — C44111 Basal cell carcinoma of skin of unspecified eyelid, including canthus: Secondary | ICD-10-CM

## 2019-12-03 MED ORDER — FUROSEMIDE 40 MG PO TABS
40.0000 mg | ORAL_TABLET | Freq: Every day | ORAL | 2 refills | Status: DC | PRN
Start: 2019-12-03 — End: 2020-03-04

## 2019-12-03 NOTE — Progress Notes (Unsigned)
outside

## 2019-12-03 NOTE — Patient Instructions (Addendum)
Medication Instructions:   Your physician has recommended you make the following change in your medication:   Stop furosemide daily-you may take one daily as needed for leg swelling  Continue other medications the same  Labwork:  Your physician recommends that you return for non-fasting lab work in: 7-10 days to check your BMET. This may be done at [facility] or [facility] ([place]) Monday-Friday from 8:00 am - 4:00 pm. No appointment is needed.  Testing/Procedures:  None  Follow-Up:  Your physician recommends that you schedule a follow-up appointment in: 3 months ([place]).  Any Other Special Instructions Will Be Listed Below (If Applicable).  If you need a refill on your cardiac medications before your next appointment, please call your pharmacy.

## 2019-12-03 NOTE — Progress Notes (Signed)
Oncology Nurse Navigator Documentation  I placed a telephone call to imaging services at [facility]. I requested the following scans completed at [facility] be sent to [facility] for viewing at [facility] as requested by Dr. Isidore Moos: 06/05/2017 CT neck soft tissue W contrast  06/05/2017 MRI Brain W/Wo contrast.   The images should be available soon for review once received by [facility] and [place].   Harlow Asa, RN, BSN, OCN Head & Neck Oncology Nurse [place] [facility] [place] [phone]

## 2019-12-03 NOTE — Progress Notes (Addendum)
Radiation Oncology         [phone] ________________________________  Initial Outpatient Consultation by telephone.  The patient opted for telemedicine to maximize safety during the pandemic.  MyChart video was not obtainable.  Name: Michael [name] MRN: [id number]  Date: 12/03/2019  DOB: December 18, 1955  HY:WVPX, Edwinna Areola, MD  Cindra Presume, MD   REFERRING PHYSICIAN: Cindra Presume, MD  DIAGNOSIS:    ICD-10-CM   1. Basal cell carcinoma (BCC) of medial canthus of right eye  C44.111   2. 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  C44.311   3. Basal cell carcinoma of canthus, right  C44.111     Cancer Staging Basal cell carcinoma of canthus, right Staging form: Cutaneous Carcinoma of the Head and Neck, AJCC 8th Edition - Clinical stage from 12/03/2019: Stage IV (cT4a, cN0, cM0) - Signed by Eppie Gibson, MD on 12/06/2019   CHIEF COMPLAINT: Here to discuss management of recurrent skin cancer  HISTORY OF PRESENT ILLNESS::Michael [name] is a 64 y.o. male who presented with an abnormal skin lesion on the right medial canthus. Biopsy on the date of 09/18/2019 revealed basal cell carcinoma, infiltrative pattern, with deep margin involvement. Of note, this is a recurrence, as the patient was treated for this over two years ago at [facility]. Treatment involved extensive resection followed by coverage with a free radial forearm flap. Reportedly, there was a close margin but no adjuvant radiation was given at that time.  Dr Mardee Postin performed the surgery; below is the path report from 06-06-17 - note that a 1cm left nasal angioleiomyoma was resected with negative margins. Cutaneous basal cell carcinoma, nodular infiltrating type, 2.5 cm, involving skin, soft tissue, minor salivary gland and nasal submucosa with focal perineural invasion (nerve 0.15 mm diameter) was resected via right maxillectomy, partial rhinectomy. Tumor focally extended to the deep cutaneous/soft tissue margin designated " superior"  (however according to notes by Dr. Mardee Postin his margins were ultimately clear).  Also, skin, left cheek, excision revealed basal cell carcinoma, nodular type, 1.9 cm, margins clear.   Final Diagnosis    A: Nasal mass, left, excision - Submucosal angioleiomyoma, 1 cm - The edges of the specimen appear uninvolved   B: Cartilage and mucosa, Right superior septal margin, biopsy - No malignancy identified - Margin free of tumor  C: Cartilage and mucosa, Right inferior septal margin, biopsy - No malignancy identified - Margin free of tumor  D: Soft tissue, bone and artery, Deep pre-maxillary margin, biopsy - No malignancy identified - Margin free of tumor  E: Squamous mucosa, Pre-maxillary mucosal margin, biopsy - No malignancy identified - Margin free of tumor  F: Bone and mucosa, Lateral nasal wall margin, biopsy - No malignancy identified - Margin free of tumor  G: Soft tissue and artery, Orbit, biopsy - No malignancy identified - Margin free of tumor  H: Soft tissue, Deep soft tissue of face, biopsy - No malignancy identified - Margin free of tumor  I: Skin, Left lip margin, biopsy - No malignancy identified - Margin free of tumor  J: Skin, Right lip margin, biopsy - No malignancy identified - Margin free of tumor  K: Skin, Right lower eyelid margin, biopsy - No malignancy identified - Margin free of tumor  L: Skin, Right cheek margin, biopsy - No malignancy identified - Margin free of tumor  M: Skin, Left nasal tip, biopsy - No malignancy identified - Margin free of tumor  N: Skin, Left ala margin, biopsy - No malignancy identified -  Margin free of tumor  O: Skin,  Nasal sill margin, biopsy - No malignancy identified - Margin free of tumor  P: Skin, Nasal dorsum, biopsy - No malignancy identified - Margin free of tumor  Q: Nerve, infraorbital nerve margin, biopsy - No malignancy identified - Margin free of tumor  R: Skin, left  inferior margin, biopsy - No malignancy identified - Margin free of tumor  S: Skin, left superior margin, biopsy - No malignancy identified - Margin free of tumor  T: Mucosa and bone, posterior inferior turbinate - No malignancy identified - Margin free of tumor  U: Lymph node, Right level I, excision - One lymph node with no malignancy identified  (0/1)  V: Skin, soft tissue, nose, sinus and bone " right maxillectomy, partial rhinectomy", resection - Cutaneous basal cell carcinoma, nodular infiltrating type, 2.5 cm, involving skin, soft tissue, minor salivary gland and nasal  submucosa - Focal perineural invasion (nerve 0.15 mm diameter, block V4) - Tumor focally extends to the deep cutaneous/soft tissue margin designated " superior" (block V3, blue ink); The remainder of the margins appear uninvolved  W:  Skin, Left cheek, excision - Basal cell carcinoma, nodular type, 1.9 cm - The margins are free of tumor      We are trying to obtain pre-op imaging from [facility] at this moment.  The patient saw Dr. Nevada Crane for concern of skin cancer recurrence at the right medial canthus and underwent biopsy on 09/18/2019 which revealed infiltrative basal cell carcinoma.  After developing a recurrence at the medial right canthus he consulted with Dr Winifred Olive on 11/14/19. He wrote: - Plan Notes: I will set up a consult w/ Dr. Claudia Desanctis and have notified him of this case. I will defer further imaging to him. This tumor is fixed down to bone of the nasal sidewall and I have injcted local anesthetic into the base of the tumor to try to hydrodissect the tumor off of bone, however I am unable to. I have offered to clear the peripheral margins and have plastics resect bone. Resection of this tumor poses a threat to body function since it likely invased into the nasal bone and possibly further. Will await discussion w/ Dr. Claudia Desanctis for futher plan.    Subsequently, the patient was seen by Dr. Claudia Desanctis on 11/25/2019. They  discussed aggressive surgery versus radiation. Given that the patient has a number of comorbidities, surgical resection would place him at higher risk. He was referred to radiation oncology to discuss an other option.  The right eye waters constantly, he states. He is not claustrophobic. He smokes 1/2ppd.  PHOTOS FROM REFERRING MD:     PREVIOUS RADIATION THERAPY: No  PAST MEDICAL HISTORY:  has a past medical history of Carotid artery disease (HCC), Chronic diastolic heart failure ([place]), CKD (chronic kidney disease) stage 3, GFR 30-59 ml/min (HCC), COPD (chronic obstructive pulmonary disease) ([place]), Coronary artery disease, Essential hypertension, Head and neck cancer ([place]) (2019), Iron deficiency anemia, and Urinary retention.    PAST SURGICAL HISTORY: Past Surgical History:  Procedure Laterality Date  . ANKLE CLOSED REDUCTION Right    open reduction  . BASAL CELL CARCINOMA EXCISION  2019   at [facility]  . COLONOSCOPY N/A 05/31/2017   Procedure: COLONOSCOPY;  Surgeon: Daneil Dolin, MD;  Location: [facility] END[facility];  Service: Endoscopy;  Laterality: N/A;  2:45pm  . CYSTOSCOPY N/A 10/29/2018   Procedure: Jilda Panda;  Surgeon: Ceasar Mons, MD;  Location: [facility] ORS;  Service: Urology;  Laterality: N/A;  . POLYPECTOMY  05/31/2017   Procedure: POLYPECTOMY;  Surgeon: Daneil Dolin, MD;  Location: [facility];  Service: Endoscopy;;  . TONSILLECTOMY    . XI ROBOTIC ASSISTED SIMPLE PROSTATECTOMY N/A 10/29/2018   Procedure: XI ROBOTIC ASSISTED SIMPLE PROSTATECTOMY;  Surgeon: Cleon Gustin, MD;  Location: [facility] ORS;  Service: Urology;  Laterality: N/A;    FAMILY HISTORY: family history includes Cirrhosis in his mother; Stroke in his father.  SOCIAL HISTORY:  reports that he quit smoking 5 days ago. His smoking use included cigarettes. He has a 20.00 pack-year smoking history. He has never used smokeless tobacco. He reports previous alcohol use. He reports that he does not  use drugs.  ALLERGIES: Patient has no known allergies.  MEDICATIONS:  Current Outpatient Medications  Medication Sig Dispense Refill  . aspirin EC 81 MG tablet Take 81 mg by mouth daily.    Marland Kitchen atorvastatin (LIPITOR) 40 MG tablet Take 1 tablet (40 mg total) by mouth every evening. 90 tablet 3  . ergocalciferol (VITAMIN D2) 1.25 MG (50000 UT) capsule Take 1 capsule (50,000 Units total) by mouth once a week. 16 capsule 3  . ferrous sulfate 325 (65 FE) MG EC tablet Take 1 tablet (325 mg total) by mouth daily with breakfast. 30 tablet 3  . isosorbide mononitrate (IMDUR) 30 MG 24 hr tablet Take 1 tablet (30 mg total) by mouth every evening. 90 tablet 3  . lisinopril (ZESTRIL) 10 MG tablet Take 10 mg by mouth daily.    . metoprolol succinate (TOPROL-XL) 25 MG 24 hr tablet Take 0.5 tablets (12.5 mg total) by mouth every evening. 45 tablet 3  . oxybutynin (DITROPAN) 5 MG tablet Take 1 tablet (5 mg total) by mouth 3 (three) times daily. 30 tablet 0  . temazepam (RESTORIL) 15 MG capsule Take 1 capsule (15 mg total) by mouth at bedtime as needed for sleep. 30 capsule 0  . furosemide (LASIX) 40 MG tablet Take 1 tablet (40 mg total) by mouth daily as needed (leg swelling). (Patient not taking: Reported on 12/03/2019) 30 tablet 2  . nitroGLYCERIN (NITROSTAT) 0.4 MG SL tablet Place 1 tablet (0.4 mg total) under the tongue every 5 (five) minutes as needed for chest pain. (Patient not taking: Reported on 12/03/2019) 90 tablet 3   No current facility-administered medications for this encounter.    REVIEW OF SYSTEMS:  Notable for that above.   PHYSICAL EXAM:  vitals were not taken for this visit.   General: Alert and oriented, in no acute distress    LABORATORY DATA:  Lab Results  Component Value Date   WBC 8.0 12/04/2019   HGB 11.3 (L) 12/04/2019   HCT 37.9 (L) 12/04/2019   MCV 85.6 12/04/2019   PLT 347 12/04/2019   CMP     Component Value Date/Time   NA 137 12/04/2019 1226   NA 140 04/23/2011 0919    K 3.6 12/04/2019 1226   K 2.4 (LL) 04/23/2011 0919   CL 98 12/04/2019 1226   CL 100 04/23/2011 0919   CO2 28 12/04/2019 1226   CO2 28 04/23/2011 0919   GLUCOSE 176 (H) 12/04/2019 1226   GLUCOSE 101 (H) 04/23/2011 0919   BUN 27 (H) 12/04/2019 1226   BUN 17 04/23/2011 0919   CREATININE 2.30 (H) 12/04/2019 1226   CREATININE 1.18 04/23/2011 0919   CALCIUM 9.1 12/04/2019 1226   CALCIUM 8.4 (L) 04/23/2011 0919   PROT 7.5 12/04/2019 1226   PROT 7.3 04/23/2011  0919   ALBUMIN 3.7 12/04/2019 1226   ALBUMIN 3.2 (L) 04/23/2011 0919   AST 45 (H) 12/04/2019 1226   AST 64 (H) 04/23/2011 0919   ALT 39 12/04/2019 1226   ALT 70 04/23/2011 0919   ALKPHOS 187 (H) 12/04/2019 1226   ALKPHOS 62 04/23/2011 0919   BILITOT 0.6 12/04/2019 1226   BILITOT 1.0 04/23/2011 0919   GFRNONAA 31 (L) 12/04/2019 1226   GFRNONAA >60 04/23/2011 0919   GFRAA 34 (L) 10/02/2019 1001   GFRAA >60 04/23/2011 0919        RADIOGRAPHY: No results found.    IMPRESSION/PLAN: Recurrent skin cancer  This is a very nice 64 year old gentleman with a history of locally advanced basal cell carcinoma of the skin which required extensive resection at [facility] over 2 years ago.  There was a positive margin at the time and he underwent observation.  Unfortunately he now has a recurrence at the right medial canthus.  He has not undergone restaging imaging.  There is concern that this is fixed to bone and he is not a good surgical candidate based on his comorbidities.    The patient's skin surgeon sent him to me for consideration of radiation therapy.  I recommend that we pursue imaging so that we can discern the extent of his recurrent disease.  We will order an MRI of the face as well as the CT maxillofacial and we will also present him at tumor board this week.  We are working on obtaining his previous imaging from [facility].    I will make a referral to social work for additional support.    I discussed with the patient that traditionally  patients receive treatment 5 days a week for 4 weeks when pursuing radiation therapy for skin cancer but given his comorbidities and transportation challenges it may be more realistic for him to get a more aggressively hypofractionated regimen that would deliver treatment twice a week for 5 weeks.  We will iron out the details after we get more information to establish his treatment plan.  Intensity modulated radiation therapy may be necessary to spare his eye.  The patient had good questions that were answered to his satisfaction today.  We discussed the logistics side effects and benefits and risks of radiation therapy in detail.  He understands that a mask will need to be made for immobilization during treatment.  We discussed possible side effects which may include but not necessarily be limited to hair loss, skin injury or irritation, injury or irritation to the eye, fatigue.  A consent form will be signed on the day that he undergoes treatment planning, the patient knows to stay tuned for information on his diagnostic imaging scheduling and he understands the treatment planning will take place thereafter.  He knows not to hesitate to call if he has any questions in the interim.  Jennifer Malmfelt , our head and neck patient navigator, was present on the call and will provide navigational support.  I asked the patient today about tobacco use. The patient uses tobacco.  I advised the patient to quit to reduce his risk of treatment-related complications and also reduce risk of tobacco related medical issues. Services were offered by me today including outpatient counseling and pharmacotherapy. I assessed for the willingness to attempt to quit and provided encouragement and demonstrated willingness to make referrals and/or prescriptions to help the patient attempt to quit. The patient has follow-up with the oncologic team to touch base on their tobacco  use and /or cessation efforts.  Over 3 minutes were spent  on this issue.  He is not interested in establishing a quit date at this time.  I recommended he call [facility] [place] for additional support.   On date of service, in total, I spent 50 minutes on this encounter. This encounter was provided by telemedicine by telephone.  The patient opted for telemedicine to maximize safety during the pandemic.  MyChart video was not obtainable. The patient has given verbal consent for this type of encounter and has been advised to only accept a meeting of this type in a secure network environment. The attendants for this meeting include Eppie Gibson  and [name].  During the encounter, Eppie Gibson was located at [facility] Radiation Oncology Department.  Jesson Foskey was located at home.    __________________________________________   Eppie Gibson, MD  This document serves as a record of services personally performed by Eppie Gibson, MD. It was created on his behalf by Clerance Lav, a trained medical scribe. The creation of this record is based on the scribe's personal observations and the provider's statements to them. This document has been checked and approved by the attending provider.

## 2019-12-03 NOTE — Progress Notes (Signed)
Oncology Nurse Navigator Documentation  . Met with patient during initial telephone consult with Dr. Isidore Moos.  . Further introduced myself as his/their Navigator, explained my role as a member of the Care Team. . Assisted with post-consult appt scheduling. Marland Kitchen He verbalized understanding of information provided. . I encouraged him to call with questions/concerns moving forward.  Harlow Asa, RN, BSN, OCN Head & Neck Oncology Nurse [place] at [place] [phone]

## 2019-12-04 ENCOUNTER — Other Ambulatory Visit: Payer: Self-pay

## 2019-12-04 ENCOUNTER — Inpatient Hospital Stay (HOSPITAL_COMMUNITY): Payer: Medicare Other | Attending: Hematology

## 2019-12-04 ENCOUNTER — Telehealth: Payer: Self-pay | Admitting: *Deleted

## 2019-12-04 DIAGNOSIS — N183 Chronic kidney disease, stage 3 unspecified: Secondary | ICD-10-CM | POA: Diagnosis not present

## 2019-12-04 DIAGNOSIS — Z87891 Personal history of nicotine dependence: Secondary | ICD-10-CM | POA: Insufficient documentation

## 2019-12-04 DIAGNOSIS — E559 Vitamin D deficiency, unspecified: Secondary | ICD-10-CM

## 2019-12-04 DIAGNOSIS — I2510 Atherosclerotic heart disease of native coronary artery without angina pectoris: Secondary | ICD-10-CM | POA: Diagnosis not present

## 2019-12-04 DIAGNOSIS — N179 Acute kidney failure, unspecified: Secondary | ICD-10-CM | POA: Insufficient documentation

## 2019-12-04 DIAGNOSIS — I110 Hypertensive heart disease with heart failure: Secondary | ICD-10-CM | POA: Diagnosis not present

## 2019-12-04 DIAGNOSIS — Z9079 Acquired absence of other genital organ(s): Secondary | ICD-10-CM | POA: Diagnosis not present

## 2019-12-04 DIAGNOSIS — N401 Enlarged prostate with lower urinary tract symptoms: Secondary | ICD-10-CM | POA: Insufficient documentation

## 2019-12-04 DIAGNOSIS — J441 Chronic obstructive pulmonary disease with (acute) exacerbation: Secondary | ICD-10-CM | POA: Insufficient documentation

## 2019-12-04 DIAGNOSIS — C44311 Basal cell carcinoma of skin of nose: Secondary | ICD-10-CM

## 2019-12-04 DIAGNOSIS — I5043 Acute on chronic combined systolic (congestive) and diastolic (congestive) heart failure: Secondary | ICD-10-CM | POA: Insufficient documentation

## 2019-12-04 DIAGNOSIS — D509 Iron deficiency anemia, unspecified: Secondary | ICD-10-CM | POA: Diagnosis present

## 2019-12-04 DIAGNOSIS — Z85828 Personal history of other malignant neoplasm of skin: Secondary | ICD-10-CM | POA: Diagnosis not present

## 2019-12-04 DIAGNOSIS — K5900 Constipation, unspecified: Secondary | ICD-10-CM | POA: Insufficient documentation

## 2019-12-04 DIAGNOSIS — D500 Iron deficiency anemia secondary to blood loss (chronic): Secondary | ICD-10-CM

## 2019-12-04 LAB — COMPREHENSIVE METABOLIC PANEL
ALT: 39 U/L (ref 0–44)
AST: 45 U/L — ABNORMAL HIGH (ref 15–41)
Albumin: 3.7 g/dL (ref 3.5–5.0)
Alkaline Phosphatase: 187 U/L — ABNORMAL HIGH (ref 38–126)
Anion gap: 11 (ref 5–15)
BUN: 27 mg/dL — ABNORMAL HIGH (ref 8–23)
CO2: 28 mmol/L (ref 22–32)
Calcium: 9.1 mg/dL (ref 8.9–10.3)
Chloride: 98 mmol/L (ref 98–111)
Creatinine, Ser: 2.3 mg/dL — ABNORMAL HIGH (ref 0.61–1.24)
GFR, Estimated: 31 mL/min — ABNORMAL LOW (ref >60)
Glucose, Bld: 176 mg/dL — ABNORMAL HIGH (ref 70–99)
Potassium: 3.6 mmol/L (ref 3.5–5.1)
Sodium: 137 mmol/L (ref 135–145)
Total Bilirubin: 0.6 mg/dL (ref 0.3–1.2)
Total Protein: 7.5 g/dL (ref 6.5–8.1)

## 2019-12-04 LAB — CBC WITH DIFFERENTIAL/PLATELET
Abs Immature Granulocytes: 0.03 10*3/uL (ref 0.00–0.07)
Basophils Absolute: 0.1 10*3/uL (ref 0.0–0.1)
Basophils Relative: 1 %
Eosinophils Absolute: 0.3 10*3/uL (ref 0.0–0.5)
Eosinophils Relative: 4 %
HCT: 37.9 % — ABNORMAL LOW (ref 39.0–52.0)
Hemoglobin: 11.3 g/dL — ABNORMAL LOW (ref 13.0–17.0)
Immature Granulocytes: 0 %
Lymphocytes Relative: 23 %
Lymphs Abs: 1.8 10*3/uL (ref 0.7–4.0)
MCH: 25.5 pg — ABNORMAL LOW (ref 26.0–34.0)
MCHC: 29.8 g/dL — ABNORMAL LOW (ref 30.0–36.0)
MCV: 85.6 fL (ref 80.0–100.0)
Monocytes Absolute: 0.5 10*3/uL (ref 0.1–1.0)
Monocytes Relative: 6 %
Neutro Abs: 5.3 10*3/uL (ref 1.7–7.7)
Neutrophils Relative %: 66 %
Platelets: 347 10*3/uL (ref 150–400)
RBC: 4.43 MIL/uL (ref 4.22–5.81)
RDW: 17.2 % — ABNORMAL HIGH (ref 11.5–15.5)
WBC: 8 10*3/uL (ref 4.0–10.5)
nRBC: 0 % (ref 0.0–0.2)

## 2019-12-04 LAB — IRON AND TIBC
Iron: 49 ug/dL (ref 45–182)
Saturation Ratios: 12 % — ABNORMAL LOW (ref 17.9–39.5)
TIBC: 405 ug/dL (ref 250–450)
UIBC: 356 ug/dL

## 2019-12-04 LAB — FERRITIN: Ferritin: 28 ng/mL (ref 24–336)

## 2019-12-04 LAB — VITAMIN D 25 HYDROXY (VIT D DEFICIENCY, FRACTURES): Vit D, 25-Hydroxy: 40.31 ng/mL (ref 30–100)

## 2019-12-04 LAB — LACTATE DEHYDROGENASE: LDH: 102 U/L (ref 98–192)

## 2019-12-04 LAB — VITAMIN B12: Vitamin B-12: 648 pg/mL (ref 180–914)

## 2019-12-04 LAB — FOLATE: Folate: 7.3 ng/mL (ref >5.9)

## 2019-12-04 NOTE — Telephone Encounter (Signed)
Called patient to inform of MRI and CT for 12-17-19- arrival time- 11:30 am, patient to have water only starting @ 9:30 am - tests to be @ [facility] [employer], lvm for a return call

## 2019-12-06 ENCOUNTER — Encounter: Payer: Self-pay | Admitting: Radiation Oncology

## 2019-12-06 ENCOUNTER — Encounter: Payer: Self-pay | Admitting: *Deleted

## 2019-12-06 DIAGNOSIS — C44111 Basal cell carcinoma of skin of unspecified eyelid, including canthus: Secondary | ICD-10-CM | POA: Insufficient documentation

## 2019-12-06 DIAGNOSIS — C44311 Basal cell carcinoma of skin of nose: Secondary | ICD-10-CM | POA: Insufficient documentation

## 2019-12-11 ENCOUNTER — Encounter: Payer: Self-pay | Admitting: General Practice

## 2019-12-11 ENCOUNTER — Inpatient Hospital Stay (HOSPITAL_BASED_OUTPATIENT_CLINIC_OR_DEPARTMENT_OTHER): Payer: Medicare Other | Admitting: Hematology

## 2019-12-11 VITALS — BP 144/69 | HR 78 | Temp 97.2°F | Resp 18 | Wt 166.9 lb

## 2019-12-11 DIAGNOSIS — D500 Iron deficiency anemia secondary to blood loss (chronic): Secondary | ICD-10-CM

## 2019-12-12 ENCOUNTER — Inpatient Hospital Stay: Payer: Medicare Other | Attending: Radiation Oncology | Admitting: General Practice

## 2019-12-12 DIAGNOSIS — C44111 Basal cell carcinoma of skin of unspecified eyelid, including canthus: Secondary | ICD-10-CM

## 2019-12-12 NOTE — Progress Notes (Signed)
[place] Work  Initial Assessment   Michael [name] is a 64 y.o. year old male assessed by phone. Clinical Social Work was referred by radiation oncologist for assessment of psychosocial needs.   SDOH (Social Determinants of Health) assessments performed: Yes   Distress Screen completed: No No flowsheet data found.    Family/Social Information:   Housing Arrangement: patient lives alone  Family members/support persons in your life? Has landlord who has been helpful in the past.  Has friends.  Is able to gather with others.   Transportation concerns: No concerns at this time.  Has someone who drives him (landlord).  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 Income source: retirement income  Financial concerns: No stable at this time; does mention "if I dont answer my phone, I may not have paid the bill."   o Type of concern: None - has both [employer] Medicare and Medicaid, has not had to pay any medical bills  Food access concerns: no  Religious or spiritual practice: None  Medication Concerns: does not have to pay for it - no concerns  Services Currently in place: none  Coping/ Adjustment to diagnosis:  Patient understands treatment plan and what happens next? Was diagnosed with locally advanced basal cell carcinoma of the skin.  Treated with surgery approx 4 years ago.  Noticed "weird spot" that didn't go away on his face.  Went to doctor for follow up and is now in process of work up and treatment plan.  Also has congestive heart failure  Concerns about diagnosis and/or treatment: not sure of treatment plan at this time.  No concerns at this time, "if its going to be its going to be." Frustrated about long wait time to be seen at [facility], left without being seen by oncologist.  Wants to have information on "what to expect during treatment" - if/when he might be sick/fatigued/have increased difficulty caring for  himself.  He lives alone, has supportive neighbors, but also needs to manage much of his care himself.  Depends on others for rides, so when appointments are delayed/not reasonably on time, he cannot wait for long periods of time as his ride is also waiting.    Patient reported stressors: none at this time  Hopes and priorities: "I don't want to be sick."  Does not want nausea/vomiting.  With his two surgeries, has not had pain or side effects.    Patient enjoys "nothing", watch TV, napping.  Visits with landlord, talks to neighbors.  Sits on his porch  Current coping skills/ strengths: Capable of independent living and General fund of knowledge    SUMMARY: Current SDOH Barriers:   Transportation  Social Isolation  Interventions:  Discussed common feeling and emotions when being diagnosed with cancer, and the importance of support during treatment  Informed patient of the support team roles and support services at [facility]  Provided CSW contact information and encouraged patient to call with any questions or concerns  Linked with [place], Radiation Financial Advocate   Follow Up Plan: Patient will contact CSW with any support or resource needs Patient verbalizes understanding of plan: Yes    [place] Work, [place], [place] Social Worker Phone:  [phone]

## 2019-12-17 ENCOUNTER — Ambulatory Visit (HOSPITAL_COMMUNITY)
Admission: RE | Admit: 2019-12-17 | Discharge: 2019-12-17 | Disposition: A | Discharge status: Home / Self Care | Payer: Medicare Other | Source: Ambulatory Visit | Attending: Radiation Oncology | Admitting: Radiation Oncology

## 2019-12-17 ENCOUNTER — Other Ambulatory Visit: Payer: Self-pay

## 2019-12-17 DIAGNOSIS — C44311 Basal cell carcinoma of skin of nose: Secondary | ICD-10-CM | POA: Diagnosis present

## 2019-12-17 MED ORDER — GADOBUTROL 1 MMOL/ML IV SOLN
7.0000 mL | Freq: Once | INTRAVENOUS | Status: AC | PRN
  Administered 2019-12-17: 7 mL via INTRAVENOUS

## 2019-12-17 MED ORDER — IOHEXOL 300 MG/ML  SOLN
75.0000 mL | Freq: Once | INTRAMUSCULAR | Status: AC | PRN
  Administered 2019-12-17: 60 mL via INTRAVENOUS

## 2019-12-30 NOTE — Progress Notes (Signed)
Oncology Nurse Navigator Documentation  Mr. Jakob has completed his imaging ordered by Dr. Isidore Moos to help proceed with radiation treatment. I have made multiple attempts to contact Mr. Kuhnle to help set up appointments and discuss with him about needing his eye doctor to help provide contact lenses to be used during treatment. Mr. Trauger phone does not ever ring through and I always get a recording saying "this call cannot be completed at this time". I have tried all contacts listed and have also not been able to get through to them or leave a message as well. I spoke with Dr. Hubbard Hartshorn assistant this morning and she confirmed that Dr. Winifred Olive would like [place] to proceed with radiation as planned as he does not plan to do surgery for Mr. Wigle. I will continue to update the team as I learn more or hear from Mr. Spradlin. I will also continue to attempt Mr. Chery by phone daily.   Harlow Asa RN, BSN, OCN Head & Neck Oncology Nurse [place] at [facility] Phone # [phone]  Fax # [phone]

## 2019-12-31 ENCOUNTER — Telehealth: Payer: Self-pay | Admitting: Radiation Oncology

## 2019-12-31 ENCOUNTER — Inpatient Hospital Stay (HOSPITAL_BASED_OUTPATIENT_CLINIC_OR_DEPARTMENT_OTHER): Payer: Medicare Other | Admitting: Oncology

## 2019-12-31 ENCOUNTER — Other Ambulatory Visit: Payer: Self-pay

## 2019-12-31 VITALS — BP 162/79 | HR 74 | Temp 97.1°F | Resp 18 | Wt 168.8 lb

## 2019-12-31 DIAGNOSIS — D509 Iron deficiency anemia, unspecified: Secondary | ICD-10-CM | POA: Diagnosis not present

## 2019-12-31 DIAGNOSIS — D500 Iron deficiency anemia secondary to blood loss (chronic): Secondary | ICD-10-CM | POA: Diagnosis not present

## 2019-12-31 NOTE — Telephone Encounter (Signed)
Correction for last note: Tawanna Sat w/ Dr. Hubbard Hartshorn office

## 2019-12-31 NOTE — Progress Notes (Signed)
[facility] Follow up:    Michael Squibb, MD [place] [address]   DIAGNOSIS: Iron deficiency anemia  CURRENT THERAPY: Intermittent iron infusions  INTERVAL HISTORY: Michael [name] 64 y.o. male was who presents for follow-up for iron deficiency anemia.  Michael [name] has been receiving IV iron weekly x3 doses approximately every 3 months for the past year or so.  He is unable to tolerate oral iron secondary to abdominal pain and constipation.  Since his last IV iron infusion he has been seen by several different physicians including his urologist for follow-up with BPH and plastic surgeon for recurrent basal cell carcinoma of his face.  Unfortunately, he will likely require additional surgery of his right medial canthus for recurrent cancer versus  radiation.  He has follow-up in the next couple weeks.  He continues to deny any bright red bleeding per rectum or melena.  He denies any easy bruising or bleeding.  He does report he can tell his hemoglobin is low due to his dizziness.  He denies any chest pain. Denies any nausea, vomiting, or diarrhea. Denies any new pains. Had not noticed any recent bleeding such as epistaxis, hematuria or hematochezia. Denies recent chest pain on exertion, shortness of breath on minimal exertion, pre-syncopal episodes, or palpitations. Denies any numbness or tingling in hands or feet. Denies any recent fevers, infections, or recent hospitalizations. Patient reports appetite at 100% and energy level at 25%.  He is eating well maintain his weight this time.    Patient Active Problem List   Diagnosis Date Noted  . Basal cell carcinoma of canthus, right 12/06/2019  . Incisional hernia, without obstruction or gangrene 11/15/2019  . Elevated PSA 06/19/2019  . Benign prostatic hyperplasia with urinary obstruction 10/29/2018  . CAP (community acquired pneumonia) 05/09/2018  . Hypertensive emergency 05/09/2018  . Acute on chronic  combined systolic and diastolic CHF (congestive heart failure) ([place]) 05/09/2018  . Lobar pneumonia ([place]) 05/09/2018  . Acute on chronic combined systolic and diastolic congestive heart failure ([place]) 05/09/2018  . Urinary retention 11/27/2017  . Abnormal nuclear stress test 11/22/2017  . Complicated UTI (urinary tract infection)   . Sepsis ([place]) 09/17/2017  . Vasovagal syncope 09/17/2017  . Sepsis secondary to UTI ([place]) 09/01/2017  . Syncope 07/05/2017  . COPD exacerbation ([place]) 07/05/2017  . Acute renal failure superimposed on chronic kidney disease ([place]) 07/05/2017  . Catheter-associated urinary tract infection ([place]) 07/05/2017  . Leukocytosis 07/05/2017  . Urinary retention with incomplete bladder emptying 07/05/2017  . UTI (urinary tract infection) 07/05/2017  . Diarrhea 07/05/2017  . Carotid artery disease ([place]) 06/25/2017  . AKI (acute kidney injury) ([place])   . Orthostatic syncope   . Syncope due to orthostatic hypotension 06/24/2017  . Heart murmur 06/24/2017  . Abnormal EKG 06/24/2017  . Abnormal PET scan of colon 05/25/2017  . 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 Periapical abscess 04/04/2017  . Iron (Fe) deficiency anemia 04/04/2017  . Essential hypertension 04/02/2017  . Anemia 04/02/2017  . Acute hypoxemic respiratory failure ([place]) 04/02/2017  . Tobacco abuse 04/02/2017  . Chronic diastolic CHF (congestive heart failure) ([place]) 04/02/2017  . Elevated troponin 04/02/2017  . Thrombocytosis 04/02/2017  . Skin lesion of face   . Nasal lesion     has No Known Allergies.  MEDICAL HISTORY: Past Medical History:  Diagnosis Date  . Carotid artery disease ([place])   . Chronic diastolic heart failure ([place])   .  CKD (chronic kidney disease) stage 3, GFR 30-59 ml/min (HCC)   . COPD (chronic obstructive pulmonary disease) ([place])   . Coronary artery disease   . Essential hypertension   . Head and neck cancer ([place]) 2019   Right facial basal cell  carcinoma s/p resection with right partial mastectomy and partal rhinectomy with skin graft (06/13/17)  . Iron deficiency anemia   . Urinary retention     SURGICAL HISTORY: Past Surgical History:  Procedure Laterality Date  . ANKLE CLOSED REDUCTION Right    open reduction  . BASAL CELL CARCINOMA EXCISION  2019   at [facility]  . COLONOSCOPY N/A 05/31/2017   Procedure: COLONOSCOPY;  Surgeon: Daneil Dolin, MD;  [facility];  Service: Endoscopy;  Laterality: N/A;  2:45pm  . CYSTOSCOPY N/A 10/29/2018   Procedure: Jilda Panda;  Surgeon: Ceasar Mons, MD;  Location: [facility];  Service: Urology;  Laterality: N/A;  . POLYPECTOMY  05/31/2017   Procedure: POLYPECTOMY;  Surgeon: Daneil Dolin, MD;  Location[facility];  Service: Endoscopy;;  . TONSILLECTOMY    . XI ROBOTIC ASSISTED SIMPLE PROSTATECTOMY N/A 10/29/2018   Procedure: XI ROBOTIC ASSISTED SIMPLE PROSTATECTOMY;  Surgeon: Cleon Gustin, MD;  Location: [facility];  Service: Urology;  Laterality: N/A;    SOCIAL HISTORY: Social History   Socioeconomic History  . Marital status: Married    Spouse name: Not on file  . Number of children: Not on file  . Years of education: Not on file  . Highest education level: Not on file  Occupational History  . Not on file  Tobacco Use  . Smoking status: Former Smoker    Packs/day: 0.50    Years: 40.00    Pack years: 20.00    Types: Cigarettes    Quit date: 12/01/2019    Years since quitting: 0.0  . Smokeless tobacco: Never Used  Vaping Use  . Vaping Use: Never used  Substance and Sexual Activity  . Alcohol use: Not Currently  . Drug use: Never  . Sexual activity: Not Currently  Other Topics Concern  . Not on file  Social History Narrative  . Not on file   Social Determinants of Health   Financial Resource Strain: Low Risk   . Difficulty of Paying Living Expenses: Not hard at all  Food Insecurity: No Food Insecurity  . Worried About Paediatric nurse in the Last Year: Never true  . Ran Out of Food in the Last Year: Never true  Transportation Needs: No Transportation Needs  . Lack of Transportation (Medical): No  . Lack of Transportation (Non-Medical): No  Physical Activity: Inactive  . Days of Exercise per Week: 0 days  . Minutes of Exercise per Session: 0 min  Stress: No Stress Concern Present  . Feeling of Stress : Not at all  Social Connections: Socially Isolated  . Frequency of Communication with Friends and Family: Never  . Frequency of Social Gatherings with Friends and Family: Never  . Attends Religious Services: Never  . Active Member of Clubs or Organizations: No  . Attends Archivist Meetings: Never  . Marital Status: Divorced  Human resources officer Violence: Not At Risk  . Fear of Current or Ex-Partner: No  . Emotionally Abused: No  . Physically Abused: No  . Sexually Abused: No    FAMILY HISTORY: Family History  Problem Relation Age of Onset  . Stroke Father   . Cirrhosis Mother   .  Colon cancer Neg Hx     Review of Systems  Constitutional: Positive for fatigue.  Neurological: Positive for dizziness.  All other systems reviewed and are negative.    Vital signs: Today's Vitals   12/31/19 1355 12/31/19 1403  BP: (!) 162/79   Pulse: 74   Resp: 18   Temp: (!) 97.1 F (36.2 C)   TempSrc: Temporal   SpO2: 100%   Weight: 168 lb 12.8 oz (76.6 kg)   PainSc:  0-No pain   Body mass index is 22.89 kg/m.   Physical Exam Constitutional:      Appearance: Normal appearance.  HENT:     Head: Normocephalic and atraumatic.  Eyes:     Pupils: Pupils are equal, round, and reactive to light.  Cardiovascular:     Rate and Rhythm: Normal rate and regular rhythm.     Heart sounds: Normal heart sounds. No murmur heard.   Pulmonary:     Effort: Pulmonary effort is normal.     Breath sounds: Normal breath sounds. No wheezing.  Abdominal:     General: Bowel sounds are normal. There is no  distension.     Palpations: Abdomen is soft.     Tenderness: There is no abdominal tenderness.  Musculoskeletal:        General: Normal range of motion.     Cervical back: Normal range of motion.  Skin:    General: Skin is warm and dry.     Findings: No rash.  Neurological:     Mental Status: He is alert and oriented to person, place, and time.  Psychiatric:        Judgment: Judgment normal.      LABORATORY DATA:  CBC    Component Value Date/Time   WBC 8.0 12/04/2019 1226   RBC 4.43 12/04/2019 1226   HGB 11.3 (L) 12/04/2019 1226   HGB 13.8 04/23/2011 0919   HCT 37.9 (L) 12/04/2019 1226   HCT 26.0 (L) 05/09/2018 0455   PLT 347 12/04/2019 1226   PLT 208 04/23/2011 0919   MCV 85.6 12/04/2019 1226   MCV 84 04/23/2011 0919   MCH 25.5 (L) 12/04/2019 1226   MCHC 29.8 (L) 12/04/2019 1226   RDW 17.2 (H) 12/04/2019 1226   RDW 12.3 04/23/2011 0919   LYMPHSABS 1.8 12/04/2019 1226   LYMPHSABS 1.1 04/23/2011 0919   MONOABS 0.5 12/04/2019 1226   MONOABS 0.9 (H) 04/23/2011 0919   EOSABS 0.3 12/04/2019 1226   EOSABS 0.0 04/23/2011 0919   BASOSABS 0.1 12/04/2019 1226   BASOSABS 0.0 04/23/2011 0919    CMP     Component Value Date/Time   NA 137 12/04/2019 1226   NA 140 04/23/2011 0919   K 3.6 12/04/2019 1226   K 2.4 (LL) 04/23/2011 0919   CL 98 12/04/2019 1226   CL 100 04/23/2011 0919   CO2 28 12/04/2019 1226   CO2 28 04/23/2011 0919   GLUCOSE 176 (H) 12/04/2019 1226   GLUCOSE 101 (H) 04/23/2011 0919   BUN 27 (H) 12/04/2019 1226   BUN 17 04/23/2011 0919   CREATININE 2.30 (H) 12/04/2019 1226   CREATININE 1.18 04/23/2011 0919   CALCIUM 9.1 12/04/2019 1226   CALCIUM 8.4 (L) 04/23/2011 0919   PROT 7.5 12/04/2019 1226   PROT 7.3 04/23/2011 0919   ALBUMIN 3.7 12/04/2019 1226   ALBUMIN 3.2 (L) 04/23/2011 0919   AST 45 (H) 12/04/2019 1226   AST 64 (H) 04/23/2011 0919   ALT 39 12/04/2019 1226  ALT 70 04/23/2011 0919   ALKPHOS 187 (H) 12/04/2019 1226   ALKPHOS 62  04/23/2011 0919   BILITOT 0.6 12/04/2019 1226   BILITOT 1.0 04/23/2011 0919   GFRNONAA 31 (L) 12/04/2019 1226   GFRNONAA >60 04/23/2011 0919   GFRAA 34 (L) 10/02/2019 1001   GFRAA >60 04/23/2011 0919   All questions were answered to patient's stated satisfaction. Encouraged patient to call with any new concerns or questions before his next visit to the cancer center and we can certain see him sooner, if needed.     ASSESSMENT and THERAPY PLAN:  1.  Iron deficiency anemia: -Likely due to CKD. -Last Feraheme infusion was on 10/10/2019, 10/17/2019 and 10/24/2019. -He denies any bright red bleeding per rectum or melena. -Patient tried oral iron therapy and was unable to tolerate it due to constipation and abdominal pains. -Labs on 12/04/2019 show hemoglobin of 11.3, ferritin 28, percent saturation 12 and platelet count of 347. -Patient has been receiving 3 IV Feraheme infusions approximately every 3 months. -We will go ahead and get him set up for IV Feraheme in the next couple weeks given his history of rapid decline of iron levels. -We will have him follow-up in about 12 weeks with repeat lab work.  2.  Locally advanced basal cell carcinoma of the right nasolabial fold: -Biopsy of the right nasal mucosa on 04/05/2017 showed basal cell carcinoma. -Biopsy of the left cheek lesion on 04/02/2017 was consistent with basal cell carcinoma. -He was initially evaluated by Dr. Blenda Nicely at [facility] and then was referred to Dr. Mardee Postin at [facility]. -He underwent resection with skin graft on 06/06/2017.  The margins was close.  Rest of the margins were negative.  He was presented at tumor board at [facility].  Recommendation was not to give any adjuvant therapy.  Overall he is very happy with the results. -He follows up with his doctors at [facility]. -He has a small area in the corner of his right eye that has come up.  Looks like it has been scratched and is scabbed over. -He has met with dermatology, a plastic  surgeon and radiation oncology in [place] for the new spot on his right eye.  I believe the plan is to begin radiation in the upcoming weeks.  3.  Uncontrolled hypertension: -Patient started back on his Coreg 12.5 mg twice daily.   -He will also continue Lipitor 40 mg daily and aspirin 81 mg daily. -He is following up with his PCP.  PCP aware of high blood pressure saw him a week and a half ago.  Disposition: -IV Feraheme x3 in the next couple weeks. -RTC in 3 months with repeat labs (CBC, CMP, vitamin D, vitamin B12, iron, ferritin and LDH) and assessment.  No problem-specific Assessment & Plan notes found for this encounter.   No orders of the defined types were placed in this encounter.   All questions were answered. The patient knows to call the clinic with any problems, questions or concerns. We can certainly see the patient much sooner if necessary. This note was electronically signed.  Greater than 50% was spent in counseling and coordination of care with this patient including but not limited to discussion of the relevant topics above (See A&P) including, but not limited to diagnosis and management of acute and chronic medical conditions.    Jacquelin Hawking, NP 12/31/2019

## 2019-12-31 NOTE — Telephone Encounter (Signed)
Jenni with Dr. Keane Scrape office contacted [place] to see if the patient decided to proceed with radiation. Pt was originally scheduled for Moh's 12/1. After speaking with Harlow Asa, I informed Tawanna Sat that [facility] is having trouble contacting the patient to inform of next steps prior to starting tx. Tawanna Sat stated that she is going to cx his Moh's and hope that we get in contact with him.

## 2020-01-01 ENCOUNTER — Ambulatory Visit: Payer: Medicare Other | Admitting: Plastic Surgery

## 2020-01-03 ENCOUNTER — Telehealth: Payer: Self-pay

## 2020-01-03 ENCOUNTER — Ambulatory Visit (HOSPITAL_COMMUNITY): Payer: Medicare Other | Admitting: Oncology

## 2020-01-03 NOTE — Telephone Encounter (Signed)
Still unable to successfully contact patient about getting scheduled to start XRT. All numbers listed in his chart either have the message "call cannot be completed" or "voicebox has not been set up to leave messages". Saw patient had an appointment with a NP at [facility] this past Tuesday 12/31/19. Called and spoke with a staff member to see if their team had any other way of connecting with patient. They denied any other methods, but stated they would update appointment notes for patient's next visit at their center with mine and [facility] numbers, and instructions to have patient contact us ASAP.   Will continue to try contact patient and update Dr. Isidore Moos on status.

## 2020-01-07 ENCOUNTER — Telehealth: Payer: Self-pay

## 2020-01-07 NOTE — Telephone Encounter (Signed)
Finally able to speak with patient on the phone. Patient stated his phone had been turned off, but was working now. Told him I would make Dr. Isidore Moos and the CT simulation therapists aware so we could get his appointments rescheduled. Patient verbalized understanding and agreement

## 2020-01-08 ENCOUNTER — Telehealth: Payer: Self-pay | Admitting: *Deleted

## 2020-01-08 NOTE — Telephone Encounter (Signed)
Michael [name] from the [facility] called to inform  that they have exhausted all tries to reach patient for treatment and will send the referral back to the surgeon and notify them.

## 2020-01-09 NOTE — Progress Notes (Signed)
Oncology Nurse Navigator Documentation  I called and left a voice mail with Michael [name] today. I informed him of his appointment on 12/15 at 1:30 for his radiation planning. I left my direct contact information and asked that he call to verify that he received my message and would attend the appointment.   Harlow Asa RN, BSN, OCN Head & Neck Oncology Nurse [place] at [facility] Phone # [phone]  Fax # [phone]

## 2020-01-10 ENCOUNTER — Other Ambulatory Visit: Payer: Self-pay

## 2020-01-10 ENCOUNTER — Encounter (HOSPITAL_COMMUNITY): Payer: Self-pay

## 2020-01-10 ENCOUNTER — Inpatient Hospital Stay (HOSPITAL_COMMUNITY): Payer: Medicare Other | Attending: Hematology

## 2020-01-10 VITALS — BP 149/66 | HR 70 | Temp 97.3°F | Resp 18

## 2020-01-10 DIAGNOSIS — D500 Iron deficiency anemia secondary to blood loss (chronic): Secondary | ICD-10-CM

## 2020-01-10 DIAGNOSIS — D509 Iron deficiency anemia, unspecified: Secondary | ICD-10-CM | POA: Diagnosis not present

## 2020-01-10 MED ORDER — SODIUM CHLORIDE 0.9 % IV SOLN: Freq: Once | INTRAVENOUS | Status: AC

## 2020-01-10 MED ORDER — SODIUM CHLORIDE 0.9 % IV SOLN
510.0000 mg | Freq: Once | INTRAVENOUS | Status: AC
  Administered 2020-01-10: 510 mg via INTRAVENOUS
  Filled 2020-01-10: qty 510

## 2020-01-10 NOTE — Progress Notes (Signed)
Patient requesting to leave before 30 minute wait time for iron infusion.  No s/s of distress noted.   Patient tolerated iron infusion with no complaints voiced.  Peripheral IV site clean and dry with good blood return noted before and after infusion.  Band aid applied.  VSS with discharge and left in satisfactory condition with no s/s of distress noted.

## 2020-01-13 ENCOUNTER — Telehealth: Payer: Self-pay

## 2020-01-13 NOTE — Telephone Encounter (Signed)
Called and spoke with patient directly to make sure he was aware of date/time of CT simulation appointment, and where to go once he's checked in. Patient verbalized understanding and agreement, and knows to call myself or [facility] Navigator should he have any questions/concerns before Wednesday's appointment

## 2020-01-15 ENCOUNTER — Ambulatory Visit: Payer: Medicare Other | Admitting: Radiation Oncology

## 2020-01-17 ENCOUNTER — Other Ambulatory Visit: Payer: Self-pay

## 2020-01-17 ENCOUNTER — Inpatient Hospital Stay (HOSPITAL_COMMUNITY): Payer: Medicare Other

## 2020-01-17 VITALS — BP 140/58 | HR 76 | Temp 97.0°F | Resp 18

## 2020-01-17 DIAGNOSIS — D500 Iron deficiency anemia secondary to blood loss (chronic): Secondary | ICD-10-CM

## 2020-01-17 DIAGNOSIS — D509 Iron deficiency anemia, unspecified: Secondary | ICD-10-CM | POA: Diagnosis not present

## 2020-01-17 MED ORDER — HYDRALAZINE HCL 20 MG/ML IJ SOLN
10.0000 mg | Freq: Once | INTRAMUSCULAR | Status: AC
  Administered 2020-01-17: 09:00:00 10 mg via INTRAVENOUS
  Filled 2020-01-17: qty 0.5

## 2020-01-17 MED ORDER — SODIUM CHLORIDE 0.9 % IV SOLN
510.0000 mg | Freq: Once | INTRAVENOUS | Status: AC
  Administered 2020-01-17: 09:00:00 510 mg via INTRAVENOUS
  Filled 2020-01-17: qty 510

## 2020-01-17 MED ORDER — SODIUM CHLORIDE 0.9 % IV SOLN: Freq: Once | INTRAVENOUS | Status: AC

## 2020-01-17 NOTE — Progress Notes (Signed)
Patient presents today for Feraheme infusion. Blood pressure on arrival 195/88. Patient denies any blurred vision, headache, or dizziness. Patient states , " I did not take my blood pressure medication last night." Patient states he is out of medication. Patient teaching performed pertaining to high blood pressure and risks involved. Patient verbalized an understanding and states he will get his blood pressure medication refilled today. Reported blood pressure to Dr. Chryl Heck via instant message.   Message received from Dr. Chryl Heck to give patient 10mg  of Hydralazine IV x 1 dose now.   08:40 am Hydralazine 10mg  given IV.

## 2020-01-17 NOTE — Progress Notes (Signed)
Feraheme infusion given today per MD orders.  Tolerated infusion without adverse affects.  Vital signs stable.  No complaints at this time.  Discharge from clinic ambulatory in stable condition.  Alert and oriented X 3.  Follow up with [facility] as scheduled.

## 2020-01-17 NOTE — Patient Instructions (Signed)
[facility] Discharge Instructions for Patients Receiving Chemotherapy  Today you received the following chemotherapy agents   To help prevent nausea and vomiting after your treatment, we encourage you to take your nausea medication   If you develop nausea and vomiting that is not controlled by your nausea medication, call the clinic.   BELOW ARE SYMPTOMS THAT SHOULD BE REPORTED IMMEDIATELY:  *FEVER GREATER THAN 100.5 F  *CHILLS WITH OR WITHOUT FEVER  NAUSEA AND VOMITING THAT IS NOT CONTROLLED WITH YOUR NAUSEA MEDICATION  *UNUSUAL SHORTNESS OF BREATH  *UNUSUAL BRUISING OR BLEEDING  TENDERNESS IN MOUTH AND THROAT WITH OR WITHOUT PRESENCE OF ULCERS  *URINARY PROBLEMS  *BOWEL PROBLEMS  UNUSUAL RASH Items with * indicate a potential emergency and should be followed up as soon as possible.  Feel free to call the clinic should you have any questions or concerns. The clinic phone number is [phone].  Please show the CHEMO ALERT CARD at check-in to the Emergency Department and triage nurse.   

## 2020-01-21 ENCOUNTER — Ambulatory Visit: Payer: Medicare Other | Admitting: Radiation Oncology

## 2020-01-22 ENCOUNTER — Telehealth: Payer: Self-pay | Admitting: Radiation Oncology

## 2020-01-22 ENCOUNTER — Ambulatory Visit: Payer: Medicare Other | Admitting: Radiation Oncology

## 2020-01-22 NOTE — Progress Notes (Signed)
Oncology Nurse Navigator Documentation  Mr. Bewley is having difficulty arranging transportation to [facility] for his radiation planning appointment. I have contacted [facility]'s transportation services and requested help for Mr. Muro. I just spoke with Mr. Wernick and informed him that he should be receiving a call from [facility]'s transportation to help set up a ride for his [facility] appointments. I also made him aware that his Radiation planning session has been rescheduled for 02/03/20 at 8:00 am. He voiced his understanding and knows to call me if he has any questions or concerns.  Harlow Asa RN, BSN, OCN Head & Neck Oncology Nurse [place] at [facility] Phone # [phone]  Fax # [phone]

## 2020-01-22 NOTE — Telephone Encounter (Signed)
Per fax from telephone service, patient needed to cancel his appointment with [facility] today and did not state why. Informed Kayla with SIM of this.

## 2020-01-27 ENCOUNTER — Ambulatory Visit: Payer: Medicare Other

## 2020-01-29 ENCOUNTER — Telehealth: Payer: Self-pay | Admitting: Internal Medicine

## 2020-01-29 NOTE — Telephone Encounter (Signed)
   Oryn Casanova DOB: March 09, 1955 MRN: [id number]   RIDER WAIVER AND RELEASE OF LIABILITY  For purposes of improving physical access to our facilities, [facility] is pleased to partner with third parties to provide [facility] patients or other authorized individuals the option of convenient, on-demand ground transportation services (the [employer]") through use of the technology service that enables users to request on-demand ground transportation from independent third-party providers.  By opting to use and accept these [employer], I, the undersigned, hereby agree on behalf of myself, and on behalf of any minor child using the [employer] for whom I am the parent or legal guardian, as follows:  1. Science writer provided to me are provided by independent third-party transportation providers who are not [employer] or employees and who are unaffiliated with [employer]. 2. [place] is neither a transportation carrier nor a common or public carrier. 3. [facility] has no control over the quality or safety of the transportation that occurs as a result of the [employer]. 4. [facility] cannot guarantee that any third-party transportation provider will complete any arranged transportation service. 5. [facility] makes no representation, warranty, or guarantee regarding the reliability, timeliness, quality, safety, suitability, or availability of any of the Transport Services or that they will be error free. 6. I fully understand that traveling by vehicle involves risks and dangers of serious bodily injury, including permanent disability, paralysis, and death. I agree, on behalf of myself and on behalf of any minor child using the Transport Services for whom I am the parent or legal guardian, that the entire risk arising out of my use of the [employer] remains solely with me, to the maximum extent permitted under applicable law. 7. The [employer] are provided "as is" and "as available." [facility] disclaims all representations and warranties, express, implied or statutory, not expressly set out in these terms, including the implied warranties of merchantability and fitness for a particular purpose. 8. I hereby waive and release [place], its agents, employees, officers, directors, representatives, insurers, attorneys, assigns, successors, subsidiaries, and affiliates from any and all past, present, or future claims, demands, liabilities, actions, causes of action, or suits of any kind directly or indirectly arising from acceptance and use of the [employer]. 9. I further waive and release [place] and its affiliates from all present and future liability and responsibility for any injury or death to persons or damages to property caused by or related to the use of the [employer]. 10. I have read this Waiver and Release of Liability, and I understand the terms used in it and their legal significance. This Waiver is freely and voluntarily given with the understanding that my right (as well as the right of any minor child for whom I am the parent or legal guardian using the [employer]) to legal recourse against  in connection with the [employer] is knowingly surrendered in return for use of these services.   I attest that I read the consent document to [name], gave Mr. Fok the opportunity to ask questions and answered the questions asked (if any). I affirm that Zygmund Passero then provided consent for he's participation in this program.     Ephriam Knuckles Vilsaint

## 2020-01-30 ENCOUNTER — Ambulatory Visit: Payer: Medicare Other

## 2020-02-03 ENCOUNTER — Telehealth: Payer: Self-pay | Admitting: Internal Medicine

## 2020-02-03 ENCOUNTER — Ambulatory Visit: Payer: Medicare Other

## 2020-02-03 ENCOUNTER — Ambulatory Visit: Payer: Medicare Other | Admitting: Radiation Oncology

## 2020-02-03 NOTE — Telephone Encounter (Signed)
   Michael [name] DOB: 02/15/1955 MRN: [id number]   RIDER WAIVER AND RELEASE OF LIABILITY  For purposes of improving physical access to our facilities, [facility] is pleased to partner with third parties to provide Michael [name] patients or other authorized individuals the option of convenient, on-demand ground transportation services (the "Transport Services") through use of the technology service that enables users to request on-demand ground transportation from independent third-party providers.  By opting to use and accept these Transport Services, I, the undersigned, hereby agree on behalf of myself, and on behalf of any minor child using the Transport Services for whom I am the parent or legal guardian, as follows:  1. Transport Services provided to me are provided by independent third-party transportation providers who are not [place] agents or employees and who are unaffiliated with [facility]. 2. Michael [name] is neither a transportation carrier nor a common or public carrier. 3. [facility] has no control over the quality or safety of the transportation that occurs as a result of the Transport Services. 4. [facility] cannot guarantee that any third-party transportation provider will complete any arranged transportation service. 5. Michael [name] makes no representation, warranty, or guarantee regarding the reliability, timeliness, quality, safety, suitability, or availability of any of the Transport Services or that they will be error free. 6. I fully understand that traveling by vehicle involves risks and dangers of serious bodily injury, including permanent disability, paralysis, and death. I agree, on behalf of myself and on behalf of any minor child using the Transport Services for whom I am the parent or legal guardian, that the entire risk arising out of my use of the Transport Services remains solely with me, to the maximum extent permitted under applicable law. 7. The Transport  Services are provided "as is" and "as available." Michael [name] disclaims all representations and warranties, express, implied or statutory, not expressly set out in these terms, including the implied warranties of merchantability and fitness for a particular purpose. 8. I hereby waive and release  Michael [name], its agents, employees, officers, directors, representatives, insurers, attorneys, assigns, successors, subsidiaries, and affiliates from any and all past, present, or future claims, demands, liabilities, actions, causes of action, or suits of any kind directly or indirectly arising from acceptance and use of the Transport Services. 9. I further waive and release Michael [name] and its affiliates from all present and future liability and responsibility for any injury or death to persons or damages to property caused by or related to the use of the Transport Services. 10. I have read this Waiver and Release of Liability, and I understand the terms used in it and their legal significance. This Waiver is freely and voluntarily given with the understanding that my right (as well as the right of any minor child for whom I am the parent or legal guardian using the Transport Services) to legal recourse against [place] in connection with the Transport Services is knowingly surrendered in return for use of these services.   I attest that I read the consent document to Michael [name], gave Michael [name] the opportunity to ask questions and answered the questions asked (if any). I affirm that Michael [name] then provided consent for Michael [name]'s participation in this program.     [name]                             

## 2020-02-03 NOTE — Telephone Encounter (Signed)
Transportation (Consent)

## 2020-02-04 ENCOUNTER — Encounter (HOSPITAL_COMMUNITY): Payer: Self-pay | Admitting: General Practice

## 2020-02-04 NOTE — Progress Notes (Signed)
[facility] CSW Progress Notes  Spoke w patient, confirmed that he has enrolled in [employer] and has rides arranged for his appointments at [facility].  Spoke w [employer] - they plan to use a [facility] driver [place]  They do not anticipate any problems with getting him to his appointments.  Santa Genera, LCSW Clinical Social Worker Phone:  [phone]

## 2020-02-06 ENCOUNTER — Ambulatory Visit: Payer: Medicare Other

## 2020-02-06 ENCOUNTER — Ambulatory Visit: Payer: Medicare Other | Admitting: Radiation Oncology

## 2020-02-06 ENCOUNTER — Encounter (HOSPITAL_COMMUNITY): Payer: Self-pay | Admitting: General Practice

## 2020-02-06 NOTE — Progress Notes (Signed)
[facility] CSW Progress Notes  Email from [facility] - they have paid $500 for kerosene delivery for patient.  Santa Genera, LCSW Clinical Social Worker Phone:  [phone]

## 2020-02-07 ENCOUNTER — Ambulatory Visit
Admission: RE | Admit: 2020-02-07 | Discharge: 2020-02-07 | Disposition: A | Discharge status: Home / Self Care | Payer: Medicare Other | Source: Ambulatory Visit | Attending: Radiation Oncology | Admitting: Radiation Oncology

## 2020-02-07 ENCOUNTER — Inpatient Hospital Stay: Payer: Medicare Other | Attending: Radiation Oncology

## 2020-02-07 DIAGNOSIS — Z23 Encounter for immunization: Secondary | ICD-10-CM | POA: Diagnosis present

## 2020-02-07 DIAGNOSIS — Z510 Encounter for antineoplastic radiation therapy: Secondary | ICD-10-CM | POA: Diagnosis present

## 2020-02-07 DIAGNOSIS — C44111 Basal cell carcinoma of skin of unspecified eyelid, including canthus: Secondary | ICD-10-CM | POA: Diagnosis present

## 2020-02-07 NOTE — Progress Notes (Signed)
   Covid-19 Vaccination Clinic  Name:  Michael [name]    MRN: [id number] DOB: 1955/06/23  02/07/2020  [name] was observed post Covid-19 immunization for 15 minutes without incident. He was provided with Vaccine Information Sheet and instruction to access the V-Safe system.   [name] was instructed to call 911 with any severe reactions post vaccine: Marland Kitchen Difficulty breathing  . Swelling of face and throat  . A fast heartbeat  . A bad rash all over body  . Dizziness and weakness   Immunizations Administered    Name Date Dose VIS Date Route   Pfizer COVID-19 Vaccine 02/07/2020  1:52 PM 0.3 mL 11/20/2019 Intramuscular   Manufacturer: [place]   Lot: [id number]   [place]: [id number]

## 2020-02-10 ENCOUNTER — Ambulatory Visit: Payer: Medicare Other

## 2020-02-10 DIAGNOSIS — Z510 Encounter for antineoplastic radiation therapy: Secondary | ICD-10-CM | POA: Diagnosis not present

## 2020-02-13 ENCOUNTER — Ambulatory Visit: Payer: Medicare Other

## 2020-02-14 ENCOUNTER — Other Ambulatory Visit: Payer: Self-pay

## 2020-02-14 ENCOUNTER — Ambulatory Visit
Admission: RE | Admit: 2020-02-14 | Discharge: 2020-02-14 | Disposition: A | Discharge status: Home / Self Care | Payer: Medicare Other | Source: Ambulatory Visit | Attending: Radiation Oncology | Admitting: Radiation Oncology

## 2020-02-14 DIAGNOSIS — Z510 Encounter for antineoplastic radiation therapy: Secondary | ICD-10-CM | POA: Diagnosis not present

## 2020-02-17 ENCOUNTER — Ambulatory Visit: Payer: Medicare Other

## 2020-02-18 ENCOUNTER — Ambulatory Visit: Payer: Medicare Other

## 2020-02-20 ENCOUNTER — Ambulatory Visit: Payer: Medicare Other

## 2020-02-21 ENCOUNTER — Other Ambulatory Visit: Payer: Self-pay

## 2020-02-21 ENCOUNTER — Ambulatory Visit
Admission: RE | Admit: 2020-02-21 | Discharge: 2020-02-21 | Disposition: A | Discharge status: Home / Self Care | Payer: Medicare Other | Source: Ambulatory Visit | Attending: Radiation Oncology | Admitting: Radiation Oncology

## 2020-02-21 DIAGNOSIS — Z510 Encounter for antineoplastic radiation therapy: Secondary | ICD-10-CM | POA: Diagnosis not present

## 2020-02-24 ENCOUNTER — Ambulatory Visit: Payer: Medicare Other

## 2020-02-25 ENCOUNTER — Other Ambulatory Visit: Payer: Self-pay

## 2020-02-25 ENCOUNTER — Ambulatory Visit
Admission: RE | Admit: 2020-02-25 | Discharge: 2020-02-25 | Disposition: A | Discharge status: Home / Self Care | Payer: Medicare Other | Source: Ambulatory Visit | Attending: Radiation Oncology | Admitting: Radiation Oncology

## 2020-02-25 DIAGNOSIS — Z510 Encounter for antineoplastic radiation therapy: Secondary | ICD-10-CM | POA: Diagnosis not present

## 2020-02-27 ENCOUNTER — Ambulatory Visit: Payer: Medicare Other

## 2020-02-28 ENCOUNTER — Other Ambulatory Visit: Payer: Self-pay

## 2020-02-28 ENCOUNTER — Ambulatory Visit
Admission: RE | Admit: 2020-02-28 | Discharge: 2020-02-28 | Disposition: A | Discharge status: Home / Self Care | Payer: Medicare Other | Source: Ambulatory Visit | Attending: Radiation Oncology | Admitting: Radiation Oncology

## 2020-02-28 DIAGNOSIS — Z510 Encounter for antineoplastic radiation therapy: Secondary | ICD-10-CM | POA: Diagnosis not present

## 2020-03-02 ENCOUNTER — Ambulatory Visit: Payer: Medicare Other

## 2020-03-03 ENCOUNTER — Ambulatory Visit
Admission: RE | Admit: 2020-03-03 | Discharge: 2020-03-03 | Disposition: A | Discharge status: Home / Self Care | Payer: Medicare Other | Source: Ambulatory Visit | Attending: Radiation Oncology | Admitting: Radiation Oncology

## 2020-03-03 DIAGNOSIS — C44111 Basal cell carcinoma of skin of unspecified eyelid, including canthus: Secondary | ICD-10-CM | POA: Diagnosis present

## 2020-03-03 DIAGNOSIS — Z510 Encounter for antineoplastic radiation therapy: Secondary | ICD-10-CM | POA: Insufficient documentation

## 2020-03-04 ENCOUNTER — Other Ambulatory Visit: Payer: Self-pay

## 2020-03-04 ENCOUNTER — Ambulatory Visit (INDEPENDENT_AMBULATORY_CARE_PROVIDER_SITE_OTHER): Payer: Medicare Other | Admitting: Student

## 2020-03-04 ENCOUNTER — Encounter: Payer: Self-pay | Admitting: Student

## 2020-03-04 VITALS — BP 138/82 | HR 81 | Ht 72.0 in | Wt 171.6 lb

## 2020-03-04 DIAGNOSIS — I5042 Chronic combined systolic (congestive) and diastolic (congestive) heart failure: Secondary | ICD-10-CM | POA: Diagnosis not present

## 2020-03-04 DIAGNOSIS — I10 Essential (primary) hypertension: Secondary | ICD-10-CM | POA: Diagnosis not present

## 2020-03-04 DIAGNOSIS — I6523 Occlusion and stenosis of bilateral carotid arteries: Secondary | ICD-10-CM | POA: Diagnosis not present

## 2020-03-04 DIAGNOSIS — Z79899 Other long term (current) drug therapy: Secondary | ICD-10-CM

## 2020-03-04 DIAGNOSIS — N1832 Chronic kidney disease, stage 3b: Secondary | ICD-10-CM

## 2020-03-04 MED ORDER — METOPROLOL SUCCINATE ER 25 MG PO TB24: 25.0000 mg | ORAL_TABLET | Freq: Every evening | ORAL | 3 refills | Status: DC

## 2020-03-04 NOTE — Progress Notes (Signed)
Cardiology Office Note    Date:  03/04/2020   ID:  Michael [name], DOB Mar 25, 1955, MRN [id number]  PCP:  [name], MD  Cardiologist: Rozann Lesches, MD    Chief Complaint  Patient presents with  . Follow-up    3 month visit    History of Present Illness:    Michael [name] is a 65 y.o. male with past medical history of chronic combined systolic and diastolic CHF (EF 40-98% by echo in 12/2017 with NST showing scar and mild ischemia), carotid artery stenosis, iron deficiency anemia, HTN, HLD and Stage 3 CKD who presents to the office today for 20-month follow-up.  He was last examined by Dr. Domenic Polite in 12/2019 and denied any recent anginal symptoms at that time. He was being followed by Vascular Surgery and was previously going to undergo a Carotid CT but this was canceled due to renal insufficiency. Lasix was discontinued given no evidence of volume overload.   In talking with the patient today, he reports doing well from a cardiac perspective and denies any recent chest pain or dyspnea on exertion. Walks a mile a few days a week to visit his neighbor down the road and denies any anginal symptoms with this. No recent orthopnea, PND or edema. He has remained on Lasix since his last visit for unclear reasons.   He has been undergoing radiation treatments for [facility] along his face and says he experiences fatigue with treatments at times but this usually improves relatively quickly.    Past Medical History:  Diagnosis Date  . Carotid artery disease ([place])   . Chronic diastolic heart failure ([place])   . CKD (chronic kidney disease) stage 3, GFR 30-59 ml/min (HCC)   . COPD (chronic obstructive pulmonary disease) ([place])   . Coronary artery disease   . Essential hypertension   . Head and neck cancer ([place]) 2019   Right facial basal cell carcinoma s/p resection with right partial mastectomy and partal rhinectomy with skin graft (06/13/17)  . Iron deficiency anemia   . Urinary retention      Past Surgical History:  Procedure Laterality Date  . ANKLE CLOSED REDUCTION Right    open reduction  . BASAL CELL CARCINOMA EXCISION  2019   at [facility]  . COLONOSCOPY N/A 05/31/2017   Procedure: COLONOSCOPY;  Surgeon: Daneil Dolin, MD;  Location: [facility];  Service: Endoscopy;  Laterality: N/A;  2:45pm  . CYSTOSCOPY N/A 10/29/2018   Procedure: Jilda Panda;  Surgeon: Ceasar Mons, MD;  Location: [facility] ORS;  Service: Urology;  Laterality: N/A;  . POLYPECTOMY  05/31/2017   Procedure: POLYPECTOMY;  Surgeon: Daneil Dolin, MD;  Location: [facility];  Service: Endoscopy;;  . TONSILLECTOMY    . XI ROBOTIC ASSISTED SIMPLE PROSTATECTOMY N/A 10/29/2018   Procedure: XI ROBOTIC ASSISTED SIMPLE PROSTATECTOMY;  Surgeon: Cleon Gustin, MD;  Location: [facility] ORS;  Service: Urology;  Laterality: N/A;    Current Medications: Outpatient Medications Prior to Visit  Medication Sig Dispense Refill  . aspirin EC 81 MG tablet Take 81 mg by mouth daily.    Marland Kitchen atorvastatin (LIPITOR) 40 MG tablet Take 1 tablet (40 mg total) by mouth every evening. 90 tablet 3  . ferrous sulfate 325 (65 FE) MG EC tablet Take 1 tablet (325 mg total) by mouth daily with breakfast. 30 tablet 3  . isosorbide mononitrate (IMDUR) 30 MG 24 hr tablet Take 1 tablet (30 mg total) by mouth every evening. 90 tablet  3  . lisinopril (ZESTRIL) 10 MG tablet Take 10 mg by mouth daily.    . nitroGLYCERIN (NITROSTAT) 0.4 MG SL tablet Place 1 tablet (0.4 mg total) under the tongue every 5 (five) minutes as needed for chest pain. 90 tablet 3  . oxybutynin (DITROPAN) 5 MG tablet Take 1 tablet (5 mg total) by mouth 3 (three) times daily. 30 tablet 0  . temazepam (RESTORIL) 15 MG capsule Take 1 capsule (15 mg total) by mouth at bedtime as needed for sleep. 30 capsule 0  . furosemide (LASIX) 40 MG tablet Take 1 tablet (40 mg total) by mouth daily as needed (leg swelling). 30 tablet 2  . metoprolol succinate (TOPROL-XL)  25 MG 24 hr tablet Take 0.5 tablets (12.5 mg total) by mouth every evening. 45 tablet 3   No facility-administered medications prior to visit.     Allergies:   Patient has no known allergies.   Social History   Socioeconomic History  . Marital status: Married    Spouse name: Not on file  . Number of children: Not on file  . Years of education: Not on file  . Highest education level: Not on file  Occupational History  . Not on file  Tobacco Use  . Smoking status: Former Smoker    Packs/day: 0.50    Years: 40.00    Pack years: 20.00    Types: Cigarettes    Quit date: 12/01/2019    Years since quitting: 0.2  . Smokeless tobacco: Never Used  Vaping Use  . Vaping Use: Never used  Substance and Sexual Activity  . Alcohol use: Not Currently  . Drug use: Never  . Sexual activity: Not Currently  Other Topics Concern  . Not on file  Social History Narrative  . Not on file   Social Determinants of Health   Financial Resource Strain: Low Risk   . Difficulty of Paying Living Expenses: Not hard at all  Food Insecurity: No Food Insecurity  . Worried About Charity fundraiser in the Last Year: Never true  . Ran Out of Food in the Last Year: Never true  Transportation Needs: No Transportation Needs  . Lack of Transportation (Medical): No  . Lack of Transportation (Non-Medical): No  Physical Activity: Inactive  . Days of Exercise per Week: 0 days  . Minutes of Exercise per Session: 0 min  Stress: No Stress Concern Present  . Feeling of Stress : Not at all  Social Connections: Socially Isolated  . Frequency of Communication with Friends and Family: Never  . Frequency of Social Gatherings with Friends and Family: Never  . Attends Religious Services: Never  . Active Member of Clubs or Organizations: No  . Attends Archivist Meetings: Never  . Marital Status: Divorced     Family History:  The patient's family history includes Cirrhosis in his mother; Stroke in his  father.   Review of Systems:   Please see the history of present illness.     General:  No chills, fever, night sweats or weight changes. Positive for fatigue.  Cardiovascular:  No chest pain, dyspnea on exertion, edema, orthopnea, palpitations, paroxysmal nocturnal dyspnea. Dermatological: No rash, lesions/masses Respiratory: No cough, dyspnea Urologic: No hematuria, dysuria Abdominal:   No nausea, vomiting, diarrhea, bright red blood per rectum, melena, or hematemesis Neurologic:  No visual changes, wkns, changes in mental status. All other systems reviewed and are otherwise negative except as noted above.   Physical Exam:  VS:  BP 138/82   Pulse 81   Ht 6' (1.829 m)   Wt 171 lb 9.6 oz (77.8 kg)   SpO2 99%   BMI 23.27 kg/m    General: Well developed, well nourished,male appearing in no acute distress. Head: Normocephalic, atraumatic. Neck: Right carotid bruit. No JVD.  Lungs: Respirations regular and unlabored, without wheezes or rales.  Heart: Regular rate and rhythm. No S3 or S4. 2/6 SEM along RUSB.  Abdomen: Appears non-distended. No obvious abdominal masses. Msk:  Strength and tone appear normal for age. No obvious joint deformities or effusions. Extremities: No clubbing or cyanosis. No lower extremity edema.  Distal pedal pulses are 2+ bilaterally. Neuro: Alert and oriented X 3. Moves all extremities spontaneously. No focal deficits noted. Psych:  Responds to questions appropriately with a normal affect. Skin: No rashes or lesions noted  Wt Readings from Last 3 Encounters:  03/04/20 171 lb 9.6 oz (77.8 kg)  12/31/19 168 lb 12.8 oz (76.6 kg)  12/11/19 166 lb 14.2 oz (75.7 kg)     Studies/Labs Reviewed:   EKG:  EKG is not ordered today.    Recent Labs: 12/04/2019: ALT 39; BUN 27; Creatinine, Ser 2.30; Hemoglobin 11.3; Platelets 347; Potassium 3.6; Sodium 137   Lipid Panel    Component Value Date/Time   CHOL 194 04/02/2017 0541   TRIG 49 04/02/2017 0541    HDL 42 04/02/2017 0541   CHOLHDL 4.6 04/02/2017 0541   VLDL 10 04/02/2017 0541   LDLCALC 142 (H) 04/02/2017 0541    Additional studies/ records that were reviewed today include:   NST: 10/2017  Findings consistent with prior inferior/septal myocardial infarction with mild peri-infarct ischemia.  This is a high risk study. Risk primarily based on decreased LVEF. There is a prior infarct with only mild current ischemia. Consider correlating LVEF with echocardiogram.  The left ventricular ejection fraction is moderately decreased (33%).   Limited Echocardiogram: 12/2017 Study Conclusions   - Left ventricle: The cavity size was normal. Wall thickness was  increased in a pattern of mild LVH. Systolic function was mildly  reduced. The estimated ejection fraction was in the range of 45%  to 50%. There is hypokinesis of the basal-midinferolateral  myocardium. Features are consistent with a pseudonormal left  ventricular filling pattern, with concomitant abnormal relaxation  and increased filling pressure (grade 2 diastolic dysfunction).  - Aortic valve: Mildly calcified annulus. Trileaflet; mildly  calcified leaflets.  - Mitral valve: Mildly calcified annulus. There was mild  regurgitation.  - Left atrium: The atrium was mildly dilated.  - Tricuspid valve: There was trivial regurgitation.  - Pulmonary arteries: Systolic pressure could not be accurately  estimated.  - Pericardium, extracardiac: A trivial pericardial effusion was  identified anterior to the heart.   Carotid Dopplers: 08/2019 Right Carotid: Velocities in the right ICA are consistent with a 80-99%         stenosis. The ECA appears >50% stenosed.   Left Carotid: Velocities in the left ICA are consistent with a 1-39%  stenosis.   Vertebrals: Bilateral vertebral arteries demonstrate antegrade flow.  Subclavians: Normal flow hemodynamics were seen in bilateral subclavian         arteries.   Assessment:    1. Chronic combined systolic and diastolic CHF (congestive heart failure) ([place])   2. Bilateral carotid artery stenosis   3. Essential hypertension   4. Medication management   5. Stage 3b chronic kidney disease ([place])      Plan:  In order of problems listed above:  1. Chronic Combined Systolic and Diastolic CHF - His EF was slightly low at 45-50% by echo in 12/2017 with NST showing scar and mild ischemia and medical management has been pursued since given no anginal symptoms.  - His respiratory status has been stable and he denies any orthopnea, PND or edema. Appears euvolemic today. Still walks for a mile at a time without angina.  - Will continue his current medication regimen with Toprol-XL 25mg  daily, Lisinopril 10mg  daily and Imdur 30mg  daily. Will stop Lasix as previously recommended given no evidence of volume overloaded. I encouraged him to make [place] aware if he does notice a change in his weight or recurrent edema.   2. Carotid Artery Stenosis - Dopplers in 08/2019 showed 80-99% RICA stenosis and 123456 LICA stenosis and a CT was not performed given his renal insufficiency. Will refer back to Vascular Surgery as he is overdue for follow-up. Continue ASA 81mg  daily and Atorvastatin 40mg  daily.   3. HTN - BP was initially elevated but improved to 138/82 on recheck. Continue current regimen with Imdur, Lisinopril and Toprol-XL. Continue Lisinopril for now pending BMET results.   4. Stage 3 CKD - Creatinine was elevated to 2.30 in 12/2019. He did not stop Lasix as recommended at the time of his last visit. Given no evidence of volume overload, will stop Lasix and recheck BMET in 2-3 weeks. Remains on Lisinopril for now.    Medication Adjustments/Labs and Tests Ordered: Current medicines are reviewed at length with the patient today.  Concerns regarding medicines are outlined above.  Medication changes, Labs and Tests ordered today are listed in the Patient  Instructions below. Patient Instructions  Medication Instructions:  Your physician has recommended you make the following change in your medication:   Stop Taking Lasix   *If you need a refill on your cardiac medications before your next appointment, please call your pharmacy*   Lab Work: Your physician recommends that you return for lab work in: BMET   If you have labs (blood work) drawn today and your tests are completely normal, you will receive your results only by: Marland Kitchen MyChart Message (if you have MyChart) OR . A paper copy in the mail If you have any lab test that is abnormal or we need to change your treatment, we will call you to review the results.   Testing/Procedures: NONE   Follow-Up: At [facility], you and your health needs are our priority.  As part of our continuing mission to provide you with exceptional heart care, we have created designated Provider Care Teams.  These Care Teams include your primary Cardiologist (physician) and Advanced Practice Providers (APPs -  Physician Assistants and Nurse Practitioners) who all work together to provide you with the care you need, when you need it.  We recommend signing up for the patient portal called "MyChart".  Sign up information is provided on this After Visit Summary.  MyChart is used to connect with patients for Virtual Visits (Telemedicine).  Patients are able to view lab/test results, encounter notes, upcoming appointments, etc.  Non-urgent messages can be sent to your provider as well.   To learn more about what you can do with MyChart, go to [email or web address].    Your next appointment:   4-5 month(s)  The format for your next appointment:   In Person  Provider:   Rozann Lesches, MD   Other Instructions Thank you for choosing [facility]!  Signed, Erma Heritage, PA-C  03/04/2020 8:02 PM    [facility] [address] [place], [place] [address] Phone:  [phone] Fax: [phone]

## 2020-03-04 NOTE — Patient Instructions (Signed)
Medication Instructions:  Your physician has recommended you make the following change in your medication:   Stop Taking Lasix   *If you need a refill on your cardiac medications before your next appointment, please call your pharmacy*   Lab Work: Your physician recommends that you return for lab work in: BMET   If you have labs (blood work) drawn today and your tests are completely normal, you will receive your results only by: Marland Kitchen MyChart Message (if you have MyChart) OR . A paper copy in the mail If you have any lab test that is abnormal or we need to change your treatment, we will call you to review the results.   Testing/Procedures: NONE   Follow-Up: At [facility], you and your health needs are our priority.  As part of our continuing mission to provide you with exceptional heart care, we have created designated Provider Care Teams.  These Care Teams include your primary Cardiologist (physician) and Advanced Practice Providers (APPs -  Physician Assistants and Nurse Practitioners) who all work together to provide you with the care you need, when you need it.  We recommend signing up for the patient portal called "MyChart".  Sign up information is provided on this After Visit Summary.  MyChart is used to connect with patients for Virtual Visits (Telemedicine).  Patients are able to view lab/test results, encounter notes, upcoming appointments, etc.  Non-urgent messages can be sent to your provider as well.   To learn more about what you can do with MyChart, go to [email or web address].    Your next appointment:   4-5 month(s)  The format for your next appointment:   In Person  Provider:   Rozann Lesches, MD   Other Instructions Thank you for choosing [facility]!

## 2020-03-05 ENCOUNTER — Ambulatory Visit: Payer: Medicare Other

## 2020-03-06 ENCOUNTER — Ambulatory Visit
Admission: RE | Admit: 2020-03-06 | Discharge: 2020-03-06 | Disposition: A | Discharge status: Home / Self Care | Payer: Medicare Other | Source: Ambulatory Visit | Attending: Radiation Oncology | Admitting: Radiation Oncology

## 2020-03-06 DIAGNOSIS — Z510 Encounter for antineoplastic radiation therapy: Secondary | ICD-10-CM | POA: Diagnosis not present

## 2020-03-09 ENCOUNTER — Ambulatory Visit: Payer: Medicare Other

## 2020-03-10 ENCOUNTER — Ambulatory Visit
Admission: RE | Admit: 2020-03-10 | Discharge: 2020-03-10 | Disposition: A | Discharge status: Home / Self Care | Payer: Medicare Other | Source: Ambulatory Visit | Attending: Radiation Oncology | Admitting: Radiation Oncology

## 2020-03-10 DIAGNOSIS — Z510 Encounter for antineoplastic radiation therapy: Secondary | ICD-10-CM | POA: Diagnosis not present

## 2020-03-11 ENCOUNTER — Other Ambulatory Visit: Payer: Self-pay | Admitting: *Deleted

## 2020-03-11 DIAGNOSIS — I6529 Occlusion and stenosis of unspecified carotid artery: Secondary | ICD-10-CM

## 2020-03-11 DIAGNOSIS — I6521 Occlusion and stenosis of right carotid artery: Secondary | ICD-10-CM

## 2020-03-13 ENCOUNTER — Ambulatory Visit
Admission: RE | Admit: 2020-03-13 | Discharge: 2020-03-13 | Disposition: A | Discharge status: Home / Self Care | Payer: Medicare Other | Source: Ambulatory Visit | Attending: Radiation Oncology | Admitting: Radiation Oncology

## 2020-03-13 ENCOUNTER — Ambulatory Visit: Payer: Medicare Other

## 2020-03-13 DIAGNOSIS — Z510 Encounter for antineoplastic radiation therapy: Secondary | ICD-10-CM | POA: Diagnosis not present

## 2020-03-17 ENCOUNTER — Other Ambulatory Visit: Payer: Self-pay

## 2020-03-17 ENCOUNTER — Ambulatory Visit
Admission: RE | Admit: 2020-03-17 | Discharge: 2020-03-17 | Disposition: A | Discharge status: Home / Self Care | Payer: Medicare Other | Source: Ambulatory Visit | Attending: Radiation Oncology | Admitting: Radiation Oncology

## 2020-03-17 ENCOUNTER — Ambulatory Visit: Payer: Medicare Other

## 2020-03-17 DIAGNOSIS — Z510 Encounter for antineoplastic radiation therapy: Secondary | ICD-10-CM | POA: Diagnosis not present

## 2020-03-20 ENCOUNTER — Ambulatory Visit
Admission: RE | Admit: 2020-03-20 | Discharge: 2020-03-20 | Disposition: A | Discharge status: Home / Self Care | Payer: Medicare Other | Source: Ambulatory Visit | Attending: Radiation Oncology | Admitting: Radiation Oncology

## 2020-03-20 ENCOUNTER — Other Ambulatory Visit: Payer: Self-pay

## 2020-03-20 ENCOUNTER — Encounter: Payer: Self-pay | Admitting: Radiation Oncology

## 2020-03-20 DIAGNOSIS — Z510 Encounter for antineoplastic radiation therapy: Secondary | ICD-10-CM | POA: Diagnosis not present

## 2020-03-23 ENCOUNTER — Other Ambulatory Visit (HOSPITAL_COMMUNITY): Payer: Self-pay

## 2020-03-23 DIAGNOSIS — D500 Iron deficiency anemia secondary to blood loss (chronic): Secondary | ICD-10-CM

## 2020-03-24 ENCOUNTER — Other Ambulatory Visit: Payer: Self-pay

## 2020-03-24 ENCOUNTER — Inpatient Hospital Stay (HOSPITAL_COMMUNITY): Payer: Medicare Other | Attending: Hematology

## 2020-03-24 DIAGNOSIS — E559 Vitamin D deficiency, unspecified: Secondary | ICD-10-CM | POA: Diagnosis not present

## 2020-03-24 DIAGNOSIS — D500 Iron deficiency anemia secondary to blood loss (chronic): Secondary | ICD-10-CM

## 2020-03-24 DIAGNOSIS — D509 Iron deficiency anemia, unspecified: Secondary | ICD-10-CM | POA: Diagnosis not present

## 2020-03-24 LAB — CBC WITH DIFFERENTIAL/PLATELET
Abs Immature Granulocytes: 0.05 K/uL (ref 0.00–0.07)
Basophils Absolute: 0.1 K/uL (ref 0.0–0.1)
Basophils Relative: 1 %
Eosinophils Absolute: 0.3 K/uL (ref 0.0–0.5)
Eosinophils Relative: 3 %
HCT: 33.9 % — ABNORMAL LOW (ref 39.0–52.0)
Hemoglobin: 10.4 g/dL — ABNORMAL LOW (ref 13.0–17.0)
Immature Granulocytes: 1 %
Lymphocytes Relative: 20 %
Lymphs Abs: 1.8 K/uL (ref 0.7–4.0)
MCH: 27.4 pg (ref 26.0–34.0)
MCHC: 30.7 g/dL (ref 30.0–36.0)
MCV: 89.4 fL (ref 80.0–100.0)
Monocytes Absolute: 0.8 K/uL (ref 0.1–1.0)
Monocytes Relative: 9 %
Neutro Abs: 5.9 K/uL (ref 1.7–7.7)
Neutrophils Relative %: 66 %
Platelets: 295 K/uL (ref 150–400)
RBC: 3.79 MIL/uL — ABNORMAL LOW (ref 4.22–5.81)
RDW: 14.1 % (ref 11.5–15.5)
WBC: 9 K/uL (ref 4.0–10.5)
nRBC: 0 % (ref 0.0–0.2)

## 2020-03-24 LAB — COMPREHENSIVE METABOLIC PANEL
ALT: 23 U/L (ref 0–44)
AST: 23 U/L (ref 15–41)
Albumin: 3.4 g/dL — ABNORMAL LOW (ref 3.5–5.0)
Alkaline Phosphatase: 150 U/L — ABNORMAL HIGH (ref 38–126)
Anion gap: 8 (ref 5–15)
BUN: 33 mg/dL — ABNORMAL HIGH (ref 8–23)
CO2: 26 mmol/L (ref 22–32)
Calcium: 9.2 mg/dL (ref 8.9–10.3)
Chloride: 106 mmol/L (ref 98–111)
Creatinine, Ser: 1.76 mg/dL — ABNORMAL HIGH (ref 0.61–1.24)
GFR, Estimated: 42 mL/min — ABNORMAL LOW (ref >60)
Glucose, Bld: 99 mg/dL (ref 70–99)
Potassium: 4.2 mmol/L (ref 3.5–5.1)
Sodium: 140 mmol/L (ref 135–145)
Total Bilirubin: 0.5 mg/dL (ref 0.3–1.2)
Total Protein: 7.1 g/dL (ref 6.5–8.1)

## 2020-03-24 LAB — VITAMIN B12: Vitamin B-12: 354 pg/mL (ref 180–914)

## 2020-03-24 LAB — IRON AND TIBC
Iron: 27 ug/dL — ABNORMAL LOW (ref 45–182)
Saturation Ratios: 6 % — ABNORMAL LOW (ref 17.9–39.5)
TIBC: 430 ug/dL (ref 250–450)
UIBC: 403 ug/dL

## 2020-03-24 LAB — FERRITIN: Ferritin: 13 ng/mL — ABNORMAL LOW (ref 24–336)

## 2020-03-24 LAB — LACTATE DEHYDROGENASE: LDH: 84 U/L — ABNORMAL LOW (ref 98–192)

## 2020-03-24 LAB — VITAMIN D 25 HYDROXY (VIT D DEFICIENCY, FRACTURES): Vit D, 25-Hydroxy: 29.77 ng/mL — ABNORMAL LOW (ref 30–100)

## 2020-03-27 ENCOUNTER — Inpatient Hospital Stay: Admission: RE | Admit: 2020-03-27 | Payer: Medicare Other | Source: Ambulatory Visit

## 2020-03-31 ENCOUNTER — Inpatient Hospital Stay (HOSPITAL_COMMUNITY): Payer: Medicare Other | Attending: Hematology | Admitting: Oncology

## 2020-03-31 ENCOUNTER — Other Ambulatory Visit: Payer: Self-pay

## 2020-03-31 ENCOUNTER — Ambulatory Visit (HOSPITAL_COMMUNITY): Payer: Medicare Other | Admitting: Hematology

## 2020-03-31 ENCOUNTER — Encounter (HOSPITAL_COMMUNITY): Payer: Self-pay | Admitting: Oncology

## 2020-03-31 VITALS — BP 155/85 | HR 62 | Temp 97.1°F | Resp 18 | Wt 173.5 lb

## 2020-03-31 DIAGNOSIS — Z85828 Personal history of other malignant neoplasm of skin: Secondary | ICD-10-CM | POA: Insufficient documentation

## 2020-03-31 DIAGNOSIS — Z87891 Personal history of nicotine dependence: Secondary | ICD-10-CM | POA: Insufficient documentation

## 2020-03-31 DIAGNOSIS — I10 Essential (primary) hypertension: Secondary | ICD-10-CM | POA: Insufficient documentation

## 2020-03-31 DIAGNOSIS — D509 Iron deficiency anemia, unspecified: Secondary | ICD-10-CM | POA: Diagnosis not present

## 2020-03-31 DIAGNOSIS — I110 Hypertensive heart disease with heart failure: Secondary | ICD-10-CM | POA: Insufficient documentation

## 2020-03-31 DIAGNOSIS — D500 Iron deficiency anemia secondary to blood loss (chronic): Secondary | ICD-10-CM

## 2020-03-31 DIAGNOSIS — E559 Vitamin D deficiency, unspecified: Secondary | ICD-10-CM | POA: Diagnosis not present

## 2020-03-31 DIAGNOSIS — N183 Chronic kidney disease, stage 3 unspecified: Secondary | ICD-10-CM | POA: Insufficient documentation

## 2020-03-31 DIAGNOSIS — I5032 Chronic diastolic (congestive) heart failure: Secondary | ICD-10-CM | POA: Diagnosis not present

## 2020-03-31 NOTE — Progress Notes (Signed)
[facility] Follow up:    [name], MD [place] [address]   DIAGNOSIS: Iron deficiency anemia  CURRENT THERAPY: Intermittent iron infusions  INTERVAL HISTORY: Michael [name] 65 y.o. male was who presents for follow-up for iron deficiency anemia.  He was last seen in clinic on 12/31/2019.  He last received IV Feraheme on 01/10/2020 and 01/17/2020.  He continues to deny any noticeable bleeding.  He is unable to tolerate oral iron secondary to abdominal pain and constipation.   Since his last visit, he has completed 10 rounds of radiation with Dr. Isidore Moos for recurrent skin cancer to right medial canthus.  He completed treatment about 1 week ago.  He reports feeling the same since his last visit.  After his iron infusions he feels a little better for about a week and then they returned back to baseline which is fairly low.   He reports a 40 to 50 pound weight loss over the past year or so.  States his appetite has just not been the same.   Patient Active Problem List   Diagnosis Date Noted  . Basal cell carcinoma of canthus, right 12/06/2019  . Incisional hernia, without obstruction or gangrene 11/15/2019  . Elevated PSA 06/19/2019  . Benign prostatic hyperplasia with urinary obstruction 10/29/2018  . CAP (community acquired pneumonia) 05/09/2018  . Hypertensive emergency 05/09/2018  . Acute on chronic combined systolic and diastolic CHF (congestive heart failure) ([place]) 05/09/2018  . Lobar pneumonia ([place]) 05/09/2018  . Acute on chronic combined systolic and diastolic congestive heart failure ([place]) 05/09/2018  . Urinary retention 11/27/2017  . Abnormal nuclear stress test 11/22/2017  . Complicated UTI (urinary tract infection)   . Sepsis ([place]) 09/17/2017  . Vasovagal syncope 09/17/2017  . Sepsis secondary to UTI ([place]) 09/01/2017  . Syncope 07/05/2017  . COPD exacerbation ([place]) 07/05/2017  . Acute renal failure superimposed on  chronic kidney disease ([place]) 07/05/2017  . Catheter-associated urinary tract infection ([place]) 07/05/2017  . Leukocytosis 07/05/2017  . Urinary retention with incomplete bladder emptying 07/05/2017  . UTI (urinary tract infection) 07/05/2017  . Diarrhea 07/05/2017  . Carotid artery disease ([place]) 06/25/2017  . AKI (acute kidney injury) ([place])   . Orthostatic syncope   . Syncope due to orthostatic hypotension 06/24/2017  . Heart murmur 06/24/2017  . Abnormal EKG 06/24/2017  . Abnormal PET scan of colon 05/25/2017  . 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 Periapical abscess 04/04/2017  . Iron (Fe) deficiency anemia 04/04/2017  . Essential hypertension 04/02/2017  . Anemia 04/02/2017  . Acute hypoxemic respiratory failure ([place]) 04/02/2017  . Tobacco abuse 04/02/2017  . Chronic diastolic CHF (congestive heart failure) ([place]) 04/02/2017  . Elevated troponin 04/02/2017  . Thrombocytosis 04/02/2017  . Skin lesion of face   . Nasal lesion     has No Known Allergies.  MEDICAL HISTORY: Past Medical History:  Diagnosis Date  . Carotid artery disease ([place])   . Chronic diastolic heart failure ([place])   . CKD (chronic kidney disease) stage 3, GFR 30-59 ml/min (HCC)   . COPD (chronic obstructive pulmonary disease) ([place])   . Coronary artery disease   . Essential hypertension   . Head and neck cancer ([place]) 2019   Right facial basal cell carcinoma s/p resection with right partial mastectomy and partal rhinectomy with skin graft (06/13/17)  . Iron deficiency anemia   . Urinary retention     SURGICAL HISTORY: Past Surgical  History:  Procedure Laterality Date  . ANKLE CLOSED REDUCTION Right    open reduction  . BASAL CELL CARCINOMA EXCISION  [facility]   [facility]  . COLONOSCOPY N/A 05/31/2017   Procedure: COLONOSCOPY;  Surgeon: Daneil Dolin, MD;  [facility];  Service: Endoscopy;  Laterality: N/A;  2:45pm  . CYSTOSCOPY N/A 10/29/2018   Procedure: Jilda Panda;  Surgeon: Ceasar Mons, MD;  Location: [facility];  Service: Urology;  Laterality: N/A;  . POLYPECTOMY  05/31/2017   Procedure: POLYPECTOMY;  Surgeon: Daneil Dolin, MD;  [facility];  Service: Endoscopy;;  . TONSILLECTOMY    . XI ROBOTIC ASSISTED SIMPLE PROSTATECTOMY N/A 10/29/2018   Procedure: XI ROBOTIC ASSISTED SIMPLE PROSTATECTOMY;  Surgeon: Cleon Gustin, MD;  Location: [facility];  Service: Urology;  Laterality: N/A;    SOCIAL HISTORY: Social History   Socioeconomic History  . Marital status: Married    Spouse name: Not on file  . Number of children: Not on file  . Years of education: Not on file  . Highest education level: Not on file  Occupational History  . Not on file  Tobacco Use  . Smoking status: Former Smoker    Packs/day: 0.50    Years: 40.00    Pack years: 20.00    Types: Cigarettes    Quit date: 12/01/2019    Years since quitting: 0.3  . Smokeless tobacco: Never Used  Vaping Use  . Vaping Use: Never used  Substance and Sexual Activity  . Alcohol use: Not Currently  . Drug use: Never  . Sexual activity: Not Currently  Other Topics Concern  . Not on file  Social History Narrative  . Not on file   Social Determinants of Health   Financial Resource Strain: Low Risk   . Difficulty of Paying Living Expenses: Not hard at all  Food Insecurity: No Food Insecurity  . Worried About Charity fundraiser in the Last Year: Never true  . Ran Out of Food in the Last Year: Never true  Transportation Needs: No Transportation Needs  . Lack of Transportation (Medical): No  . Lack of Transportation (Non-Medical): No  Physical Activity: Inactive  . Days of Exercise per Week: 0 days  . Minutes of Exercise per Session: 0 min  Stress: No Stress Concern Present  . Feeling of Stress : Not at all  Social Connections: Socially Isolated  . Frequency of Communication with Friends and Family: Never  . Frequency of Social Gatherings with  Friends and Family: Never  . Attends Religious Services: Never  . Active Member of Clubs or Organizations: No  . Attends Archivist Meetings: Never  . Marital Status: Divorced  Human resources officer Violence: Not At Risk  . Fear of Current or Ex-Partner: No  . Emotionally Abused: No  . Physically Abused: No  . Sexually Abused: No    FAMILY HISTORY: Family History  Problem Relation Age of Onset  . Stroke Father   . Cirrhosis Mother   . Colon cancer Neg Hx     Review of Systems  Constitutional: Positive for fatigue.  Neurological: Positive for dizziness.  All other systems reviewed and are negative.    Vital signs: Today's Vitals   03/31/20 1002 03/31/20 1034  BP: (!) 155/85   Pulse: 62   Resp: 18   Temp: (!) 97.1 F (36.2 C)   TempSrc: Temporal   SpO2: 100%   Weight: 173 lb 8 oz (  78.7 kg)   PainSc:  0-No pain   Body mass index is 23.53 kg/m.   Physical Exam Constitutional:      Appearance: Normal appearance.  HENT:     Head: Normocephalic and atraumatic.  Eyes:     Pupils: Pupils are equal, round, and reactive to light.  Cardiovascular:     Rate and Rhythm: Normal rate and regular rhythm.     Heart sounds: Normal heart sounds. No murmur heard.   Pulmonary:     Effort: Pulmonary effort is normal.     Breath sounds: Normal breath sounds. No wheezing.  Abdominal:     General: Bowel sounds are normal. There is no distension.     Palpations: Abdomen is soft.     Tenderness: There is no abdominal tenderness.  Musculoskeletal:        General: Normal range of motion.     Cervical back: Normal range of motion.  Skin:    General: Skin is warm and dry.     Findings: No rash.  Neurological:     Mental Status: He is alert and oriented to person, place, and time.  Psychiatric:        Judgment: Judgment normal.      LABORATORY DATA:  CBC    Component Value Date/Time   WBC 9.0 03/24/2020 0931   RBC 3.79 (L) 03/24/2020 0931   HGB 10.4 (L)  03/24/2020 0931   HGB 13.8 04/23/2011 0919   HCT 33.9 (L) 03/24/2020 0931   HCT 26.0 (L) 05/09/2018 0455   PLT 295 03/24/2020 0931   PLT 208 04/23/2011 0919   MCV 89.4 03/24/2020 0931   MCV 84 04/23/2011 0919   MCH 27.4 03/24/2020 0931   MCHC 30.7 03/24/2020 0931   RDW 14.1 03/24/2020 0931   RDW 12.3 04/23/2011 0919   LYMPHSABS 1.8 03/24/2020 0931   LYMPHSABS 1.1 04/23/2011 0919   MONOABS 0.8 03/24/2020 0931   MONOABS 0.9 (H) 04/23/2011 0919   EOSABS 0.3 03/24/2020 0931   EOSABS 0.0 04/23/2011 0919   BASOSABS 0.1 03/24/2020 0931   BASOSABS 0.0 04/23/2011 0919    CMP     Component Value Date/Time   NA 140 03/24/2020 0931   NA 140 04/23/2011 0919   K 4.2 03/24/2020 0931   K 2.4 (LL) 04/23/2011 0919   CL 106 03/24/2020 0931   CL 100 04/23/2011 0919   CO2 26 03/24/2020 0931   CO2 28 04/23/2011 0919   GLUCOSE 99 03/24/2020 0931   GLUCOSE 101 (H) 04/23/2011 0919   BUN 33 (H) 03/24/2020 0931   BUN 17 04/23/2011 0919   CREATININE 1.76 (H) 03/24/2020 0931   CREATININE 1.18 04/23/2011 0919   CALCIUM 9.2 03/24/2020 0931   CALCIUM 8.4 (L) 04/23/2011 0919   PROT 7.1 03/24/2020 0931   PROT 7.3 04/23/2011 0919   ALBUMIN 3.4 (L) 03/24/2020 0931   ALBUMIN 3.2 (L) 04/23/2011 0919   AST 23 03/24/2020 0931   AST 64 (H) 04/23/2011 0919   ALT 23 03/24/2020 0931   ALT 70 04/23/2011 0919   ALKPHOS 150 (H) 03/24/2020 0931   ALKPHOS 62 04/23/2011 0919   BILITOT 0.5 03/24/2020 0931   BILITOT 1.0 04/23/2011 0919   GFRNONAA 42 (L) 03/24/2020 0931   GFRNONAA >60 04/23/2011 0919   GFRAA 34 (L) 10/02/2019 1001   GFRAA >60 04/23/2011 0919   All questions were answered to patient's stated satisfaction. Encouraged patient to call with any new concerns or questions before his next visit to  the cancer center and we can certain see him sooner, if needed.     ASSESSMENT and THERAPY PLAN:  1.  Iron deficiency anemia: -Likely due to CKD and malabsorption. -Last Feraheme infusion was on  01/10/20 and 01/17/20. -He denies any bright red bleeding per rectum or melena. -Patient tried oral iron therapy and was unable to tolerate it due to constipation and abdominal pains. -Labs on 03/24/20 show hemoglobin of 10.4, ferritin 13, percent saturation 6 and platelet count of 295. -Patient has been receiving 2-3 IV Feraheme infusions approximately every 3 months. -We will go ahead and get him set up for 2 doses of IV iron. -We will have him follow-up in about 12 weeks with repeat lab work.  2.  Locally advanced basal cell carcinoma of the right nasolabial fold: -Biopsy of the right nasal mucosa on 04/05/2017 showed basal cell carcinoma. -Biopsy of the left cheek lesion on 04/02/2017 was consistent with basal cell carcinoma. -He was initially evaluated by Dr. Blenda Nicely at [facility] and then was referred to Dr. Mardee Postin at [facility]. -He underwent resection with skin graft on 06/06/2017.  The margins was close.  Rest of the margins were negative.  He was presented at tumor board at [facility].  Recommendation was not to give any adjuvant therapy.  Overall he is very happy with the results. -He follows up with his doctors at [facility]. -He has a small area in the corner of his right eye that has come up.  Looks like it has been scratched and is scabbed over. -He has met with dermatology, a plastic surgeon and radiation oncology in [place] for the new spot on his right eye.  -Recently completed 10 rounds of radiation with Dr. Isidore Moos at [facility] to recurrent basal cell cancer to right medial canthus. Last tx 03/20/20.   3.  Uncontrolled hypertension: -Patient started back on his Coreg 12.5 mg twice daily.   -He will also continue Lipitor 40 mg daily and aspirin 81 mg daily. -He is following up with his PCP.  PCP aware of high blood pressure saw him a week and a half ago.  4. Vitamin D deficiency: -Labs from 03/24/20 show a vitamin D level of 29.77. -Recommend OTC calcium and vitamin  D.  Disposition: -Start OTC calcium/vitamin D.  -IV Feraheme x 2-3 in the next couple weeks. -RTC in 3 months with repeat labs (CBC, CMP, vitamin D, vitamin B12, iron, ferritin and LDH) and assessment.  No problem-specific Assessment & Plan notes found for this encounter.   No orders of the defined types were placed in this encounter.   All questions were answered. The patient knows to call the clinic with any problems, questions or concerns. We can certainly see the patient much sooner if necessary. This note was electronically signed.  Greater than 50% was spent in counseling and coordination of care with this patient including but not limited to discussion of the relevant topics above (See A&P) including, but not limited to diagnosis and management of acute and chronic medical conditions.    Jacquelin Hawking, NP 03/31/2020

## 2020-04-02 ENCOUNTER — Ambulatory Visit (INDEPENDENT_AMBULATORY_CARE_PROVIDER_SITE_OTHER): Payer: Medicare Other | Admitting: Vascular Surgery

## 2020-04-02 ENCOUNTER — Encounter: Payer: Self-pay | Admitting: Vascular Surgery

## 2020-04-02 ENCOUNTER — Other Ambulatory Visit: Payer: Self-pay

## 2020-04-02 VITALS — BP 205/99 | HR 75 | Temp 98.2°F | Resp 20 | Ht 72.0 in | Wt 175.0 lb

## 2020-04-02 DIAGNOSIS — I6521 Occlusion and stenosis of right carotid artery: Secondary | ICD-10-CM

## 2020-04-02 NOTE — Progress Notes (Signed)
Patient is a 65 year old male who returns for follow-up today.  I have been following him for a greater than 80% carotid stenosis on his right side since 2019.  At the time of the original discovery of this stenosis he had a head and neck cancer that needed to be resected.  He subsequently had removal of this and a flap placed over it.  He has not really had significant recurrent disease.  He did have a CT angiogram in 2020 which again confirmed right side internal carotid artery stenosis with moderate calcification.  At that time we were considering TCAR stenting.  Patient deferred at that time.  He has not had any symptoms of TIA amaurosis or stroke.  We had planned to repeat his CT angio of the neck recently but his renal function was not completely normal with a creatinine of 1.8.  Current medication adjustments are being made by his primary care team.  Consideration is being given for stopping his lisinopril.  Today he had forgotten to take his blood pressure medication and his blood pressure was significantly elevated in the 200 range.  Past Medical History:  Diagnosis Date  . Carotid artery disease ([place])   . Chronic diastolic heart failure ([place])   . CKD (chronic kidney disease) stage 3, GFR 30-59 ml/min (HCC)   . COPD (chronic obstructive pulmonary disease) ([place])   . Coronary artery disease   . Essential hypertension   . Head and neck cancer ([place]) 2019   Right facial basal cell carcinoma s/p resection with right partial mastectomy and partal rhinectomy with skin graft (06/13/17)  . Iron deficiency anemia   . Urinary retention     Past Surgical History:  Procedure Laterality Date  . ANKLE CLOSED REDUCTION Right    open reduction  . BASAL CELL CARCINOMA EXCISION  [facility]   [facility]  . COLONOSCOPY N/A 05/31/2017   Procedure: COLONOSCOPY;  Surgeon: Daneil Dolin, MD;  [facility];  Service: Endoscopy;  Laterality: N/A;  2:45pm  . CYSTOSCOPY N/A 10/29/2018   Procedure: Jilda Panda;  Surgeon: Ceasar Mons, MD;  [facility];  Service: Urology;  Laterality: N/A;  . POLYPECTOMY  05/31/2017   Procedure: POLYPECTOMY;  Surgeon: Daneil Dolin, MD;  [facility];  Service: Endoscopy;;  . TONSILLECTOMY    . XI ROBOTIC ASSISTED SIMPLE PROSTATECTOMY N/A 10/29/2018   Procedure: XI ROBOTIC ASSISTED SIMPLE PROSTATECTOMY;  Surgeon: Cleon Gustin, MD;  [facility];  Service: Urology;  Laterality: N/A;    Current Outpatient Medications on File Prior to Visit  Medication Sig Dispense Refill  . aspirin EC 81 MG tablet Take 81 mg by mouth daily.    Marland Kitchen atorvastatin (LIPITOR) 40 MG tablet Take 1 tablet (40 mg total) by mouth every evening. 90 tablet 3  . ferrous sulfate 325 (65 FE) MG EC tablet Take 1 tablet (325 mg total) by mouth daily with breakfast. 30 tablet 3  . isosorbide mononitrate (IMDUR) 30 MG 24 hr tablet Take 1 tablet (30 mg total) by mouth every evening. 90 tablet 3  . lisinopril (ZESTRIL) 10 MG tablet Take 10 mg by mouth daily.    . metoprolol succinate (TOPROL-XL) 25 MG 24 hr tablet Take 1 tablet (25 mg total) by mouth every evening. 90 tablet 3  . oxybutynin (DITROPAN) 5 MG tablet Take 1 tablet (5 mg total) by mouth 3 (three) times daily. 30 tablet 0  . temazepam (RESTORIL) 15 MG capsule  Take 1 capsule (15 mg total) by mouth at bedtime as needed for sleep. 30 capsule 0  . nitroGLYCERIN (NITROSTAT) 0.4 MG SL tablet Place 1 tablet (0.4 mg total) under the tongue every 5 (five) minutes as needed for chest pain. 90 tablet 3   No current facility-administered medications on file prior to visit.    No Known Allergies  Review of systems: He feels well.  He has no shortness of breath.  He has no chest pain.  Physical exam:  Vitals:   04/02/20 0947 04/02/20 0949  BP: (!) 205/84 (!) 205/99  Pulse: 75   Resp: 20   Temp: 98.2 F (36.8 C)   SpO2: 98%   Weight: 175 lb (79.4 kg)   Height: 6' (1.829 m)     Neuro:  Symmetric upper extremity lower extremity motor strength 5/5  Neck: Well-healed scar right nasolabial and neck region with some dry eschar near the nose bridge which patient states was secondary to radiation.  This was for a recurrent cancer which was completed with radiation therapy March 20, 2020.  Data: Last imaging was a carotid duplex in July 2021.  I also reviewed his CT angio of the neck from 2020.  These were all consistent with greater than 80% right internal carotid artery stenosis minimal left side disease  Assessment: Patient with greater than 80% right internal carotid artery stenosis which has been stable over the last 3 years and asymptomatic.  I discussed with him again today the possibility of carotid stenting on the right side to prevent stroke.  We discussed risk of stroke about 5 %/year with medication only.  Risk of stroke with operation 1 to 2%.  At this point he wishes to defer an intervention.  I discussed with him the signs and symptoms of TIA amaurosis or stroke.  He will call us if he experiences any of this to reassess.  Plan: Patient will follow up in 6 months with carotid duplex scan.  Patient was encouraged to take his blood pressure medication daily.  He will continue to take his aspirin and statin.  I would agree with his primary care team that consideration for stopping his lisinopril would be in order in light of his renal dysfunction.  We will leave this at their discretion.  Ruta Hinds, MD [facility] Office: [phone]

## 2020-04-03 ENCOUNTER — Inpatient Hospital Stay (HOSPITAL_COMMUNITY): Payer: Medicare Other

## 2020-04-03 VITALS — BP 172/86 | HR 66 | Temp 97.2°F | Resp 18

## 2020-04-03 DIAGNOSIS — D500 Iron deficiency anemia secondary to blood loss (chronic): Secondary | ICD-10-CM

## 2020-04-03 DIAGNOSIS — D509 Iron deficiency anemia, unspecified: Secondary | ICD-10-CM | POA: Diagnosis not present

## 2020-04-03 MED ORDER — SODIUM CHLORIDE 0.9 % IV SOLN: Freq: Once | INTRAVENOUS | Status: AC

## 2020-04-03 MED ORDER — SODIUM CHLORIDE 0.9 % IV SOLN
510.0000 mg | Freq: Once | INTRAVENOUS | Status: AC
  Administered 2020-04-03: 510 mg via INTRAVENOUS
  Filled 2020-04-03: qty 510

## 2020-04-03 NOTE — Patient Instructions (Addendum)
Ferumoxytol injection What is this medicine? FERUMOXYTOL is an iron complex. Iron is used to make healthy red blood cells, which carry oxygen and nutrients throughout the body. This medicine is used to treat iron deficiency anemia. This medicine may be used for other purposes; ask your health care provider or pharmacist if you have questions. COMMON BRAND NAME(S): Feraheme What should I tell my health care provider before I take this medicine? They need to know if you have any of these conditions:  anemia not caused by low iron levels  high levels of iron in the blood  magnetic resonance imaging (MRI) test scheduled  an unusual or allergic reaction to iron, other medicines, foods, dyes, or preservatives  pregnant or trying to get pregnant  breast-feeding How should I use this medicine? This medicine is for injection into a vein. It is given by a health care professional in a hospital or clinic setting. Talk to your pediatrician regarding the use of this medicine in children. Special care may be needed. Overdosage: If you think you have taken too much of this medicine contact a poison control center or emergency room at once. NOTE: This medicine is only for you. Do not share this medicine with others. What if I miss a dose? It is important not to miss your dose. Call your doctor or health care professional if you are unable to keep an appointment. What may interact with this medicine? This medicine may interact with the following medications:  other iron products This list may not describe all possible interactions. Give your health care provider a list of all the medicines, herbs, non-prescription drugs, or dietary supplements you use. Also tell them if you smoke, drink alcohol, or use illegal drugs. Some items may interact with your medicine. What should I watch for while using this medicine? Visit your doctor or healthcare professional regularly. Tell your doctor or healthcare  professional if your symptoms do not start to get better or if they get worse. You may need blood work done while you are taking this medicine. You may need to follow a special diet. Talk to your doctor. Foods that contain iron include: whole grains/cereals, dried fruits, beans, or peas, leafy green vegetables, and organ meats (liver, kidney). What side effects may I notice from receiving this medicine? Side effects that you should report to your doctor or health care professional as soon as possible:  allergic reactions like skin rash, itching or hives, swelling of the face, lips, or tongue  breathing problems  changes in blood pressure  feeling faint or lightheaded, falls  fever or chills  flushing, sweating, or hot feelings  swelling of the ankles or feet Side effects that usually do not require medical attention (report to your doctor or health care professional if they continue or are bothersome):  diarrhea  headache  nausea, vomiting  stomach pain This list may not describe all possible side effects. Call your doctor for medical advice about side effects. You may report side effects to FDA at 1-800-FDA-1088. Where should I keep my medicine? This drug is given in a hospital or clinic and will not be stored at home. NOTE: This sheet is a summary. It may not cover all possible information. If you have questions about this medicine, talk to your doctor, pharmacist, or health care provider.  2021 Elsevier/Gold Standard (2016-03-07 20:21:10)  

## 2020-04-03 NOTE — Progress Notes (Signed)
Patient presents today for Feraheme infusion.  Vital signs were taken and BP noted to be elevated.  BP retaken and still elevated but much lower than the first.  [place] notified of BP.  Peripheral IV started and good blood return noted before starting infusion.  Per Dr. Chryl Heck patient should take home blood pressure medication when he gets home.  Instructed the patient to take blood pressure medication when he gets home.  Patient expressed understanding.  Patient refused to stay the full thirty minute after infusion wait time, stating that he never stays the whole time.  Feraheme infusion given today per MD orders.  Stable during infusion without adverse affects.  Vital signs stable.  No complaints at this time.  Discharge from clinic ambulatory in stable condition.  Alert and oriented X 3.  Follow up with [facility] as scheduled.

## 2020-04-06 ENCOUNTER — Other Ambulatory Visit: Payer: Self-pay | Admitting: *Deleted

## 2020-04-06 ENCOUNTER — Other Ambulatory Visit: Payer: Self-pay

## 2020-04-06 DIAGNOSIS — I6529 Occlusion and stenosis of unspecified carotid artery: Secondary | ICD-10-CM

## 2020-04-10 ENCOUNTER — Inpatient Hospital Stay (HOSPITAL_COMMUNITY): Payer: Medicare Other

## 2020-04-10 ENCOUNTER — Other Ambulatory Visit: Payer: Self-pay

## 2020-04-10 ENCOUNTER — Encounter (HOSPITAL_COMMUNITY): Payer: Self-pay

## 2020-04-10 VITALS — BP 174/75 | HR 57 | Temp 97.2°F | Resp 18

## 2020-04-10 DIAGNOSIS — D500 Iron deficiency anemia secondary to blood loss (chronic): Secondary | ICD-10-CM

## 2020-04-10 DIAGNOSIS — D509 Iron deficiency anemia, unspecified: Secondary | ICD-10-CM | POA: Diagnosis not present

## 2020-04-10 MED ORDER — SODIUM CHLORIDE 0.9 % IV SOLN
510.0000 mg | Freq: Once | INTRAVENOUS | Status: AC
  Administered 2020-04-10: 510 mg via INTRAVENOUS
  Filled 2020-04-10: qty 510

## 2020-04-10 MED ORDER — SODIUM CHLORIDE 0.9 % IV SOLN: Freq: Once | INTRAVENOUS | Status: AC

## 2020-04-10 NOTE — Progress Notes (Signed)
Feraheme given today per MD orders. Tolerated infusion without adverse affects. Vital signs stable. No complaints at this time. Discharged from clinic ambulatory in stable condition. Alert and oriented x 3. F/U with [facility] as scheduled.  

## 2020-04-10 NOTE — Patient Instructions (Signed)
[facility] at [facility]  Discharge Instructions: Feraheme _______________________________________________________________  Thank you for choosing [facility] at [facility] to provide your oncology and hematology care.  To afford each patient quality time with our providers, please arrive at least 15 minutes before your scheduled appointment.  You need to re-schedule your appointment if you arrive 10 or more minutes late.  We strive to give you quality time with our providers, and arriving late affects you and other patients whose appointments are after yours.  Also, if you no show three or more times for appointments you may be dismissed from the clinic.  Again, thank you for choosing [facility] at [facility]. Our hope is that these requests will allow you access to exceptional care and in a timely manner. _______________________________________________________________  If you have questions after your visit, please contact our office at [phone] between the hours of 8:30 a.m. and 5:00 p.m. Voicemails left after 4:30 p.m. will not be returned until the following business day. _______________________________________________________________  For prescription refill requests, have your pharmacy contact our office. _______________________________________________________________  Recommendations made by the consultant and any test results will be sent to your referring physician. _______________________________________________________________ 

## 2020-04-17 ENCOUNTER — Other Ambulatory Visit: Payer: Self-pay

## 2020-04-17 ENCOUNTER — Inpatient Hospital Stay (HOSPITAL_COMMUNITY): Payer: Medicare Other

## 2020-04-17 ENCOUNTER — Encounter: Payer: Self-pay | Admitting: Radiation Oncology

## 2020-04-17 ENCOUNTER — Ambulatory Visit
Admission: RE | Admit: 2020-04-17 | Discharge: 2020-04-17 | Disposition: A | Discharge status: Home / Self Care | Payer: Medicare Other | Source: Ambulatory Visit | Attending: Radiation Oncology | Admitting: Radiation Oncology

## 2020-04-17 VITALS — BP 146/72 | HR 61 | Temp 97.2°F | Resp 18

## 2020-04-17 VITALS — BP 161/83 | HR 64 | Temp 97.5°F | Resp 18 | Wt 179.4 lb

## 2020-04-17 DIAGNOSIS — Z923 Personal history of irradiation: Secondary | ICD-10-CM | POA: Diagnosis not present

## 2020-04-17 DIAGNOSIS — L538 Other specified erythematous conditions: Secondary | ICD-10-CM | POA: Diagnosis not present

## 2020-04-17 DIAGNOSIS — Z7982 Long term (current) use of aspirin: Secondary | ICD-10-CM | POA: Diagnosis not present

## 2020-04-17 DIAGNOSIS — D509 Iron deficiency anemia, unspecified: Secondary | ICD-10-CM | POA: Diagnosis not present

## 2020-04-17 DIAGNOSIS — D500 Iron deficiency anemia secondary to blood loss (chronic): Secondary | ICD-10-CM

## 2020-04-17 DIAGNOSIS — H5789 Other specified disorders of eye and adnexa: Secondary | ICD-10-CM | POA: Diagnosis not present

## 2020-04-17 DIAGNOSIS — Z79899 Other long term (current) drug therapy: Secondary | ICD-10-CM | POA: Diagnosis not present

## 2020-04-17 DIAGNOSIS — C44111 Basal cell carcinoma of skin of unspecified eyelid, including canthus: Secondary | ICD-10-CM | POA: Insufficient documentation

## 2020-04-17 MED ORDER — SODIUM CHLORIDE 0.9 % IV SOLN
510.0000 mg | Freq: Once | INTRAVENOUS | Status: AC
  Administered 2020-04-17: 510 mg via INTRAVENOUS
  Filled 2020-04-17: qty 510

## 2020-04-17 MED ORDER — SODIUM CHLORIDE 0.9 % IV SOLN: Freq: Once | INTRAVENOUS | Status: AC

## 2020-04-17 NOTE — Progress Notes (Signed)
Patient presents today for Feraheme infusion.  Vital signs WNL.  Patient has no new complaints since last visit.  Feraheme given today per MD orders.  Stable during infusion without adverse affects.  Peripheral IV started and blood return noted before and after infusion.  Vital signs stable.  No complaints at this time.  Discharge from clinic ambulatory in stable condition.  Alert and oriented X 3.  Follow up with [facility] as scheduled.

## 2020-04-17 NOTE — Progress Notes (Signed)
Mr. Behney presents today for follow up after completing radiation to his right medial canthus on 03/20/2020  Pain: Patient denies Skin: Healing well. Color almost back to baseline and texture no longer scaly  Fatigue: Continues to deal with tiredness and lack of motivation to do activities.  Dermatology F/U: Nothing scheduled currently Other issues of note: Received final feraheme infusion this morning (has F/U with Dr. Delton Coombes for iron deficiency anemia 07/16/2020). Plans to reach out to eye surgeon in [place] to have cataracts removed. Overall reports he feels well (other than fatigue) and denies any new concerns/symptoms  Vitals:   04/17/20 0951  BP: (!) 161/83  Pulse: 64  Resp: 18  Temp: (!) 97.5 F (36.4 C)  SpO2: 100%   Wt Readings from Last 3 Encounters:  04/17/20 179 lb 6.4 oz (81.4 kg)  04/02/20 175 lb (79.4 kg)  03/31/20 173 lb 8 oz (78.7 kg)

## 2020-04-17 NOTE — Patient Instructions (Signed)
Ferumoxytol infusion What is this medicine? FERUMOXYTOL is an iron complex. Iron is used to make healthy red blood cells, which carry oxygen and nutrients throughout the body. This medicine is used to treat iron deficiency anemia. This medicine may be used for other purposes; ask your health care provider or pharmacist if you have questions. COMMON BRAND NAME(S): Feraheme What should I tell my health care provider before I take this medicine? They need to know if you have any of these conditions:  anemia not caused by low iron levels  high levels of iron in the blood  magnetic resonance imaging (MRI) test scheduled  an unusual or allergic reaction to iron, other medicines, foods, dyes, or preservatives  pregnant or trying to get pregnant  breast-feeding How should I use this medicine? This medicine is for injection into a vein. It is given by a health care professional in a hospital or clinic setting. Talk to your pediatrician regarding the use of this medicine in children. Special care may be needed. Overdosage: If you think you have taken too much of this medicine contact a poison control center or emergency room at once. NOTE: This medicine is only for you. Do not share this medicine with others. What if I miss a dose? It is important not to miss your dose. Call your doctor or health care professional if you are unable to keep an appointment. What may interact with this medicine? This medicine may interact with the following medications:  other iron products This list may not describe all possible interactions. Give your health care provider a list of all the medicines, herbs, non-prescription drugs, or dietary supplements you use. Also tell them if you smoke, drink alcohol, or use illegal drugs. Some items may interact with your medicine. What should I watch for while using this medicine? Visit your doctor or healthcare professional regularly. Tell your doctor or healthcare  professional if your symptoms do not start to get better or if they get worse. You may need blood work done while you are taking this medicine. You may need to follow a special diet. Talk to your doctor. Foods that contain iron include: whole grains/cereals, dried fruits, beans, or peas, leafy green vegetables, and organ meats (liver, kidney). What side effects may I notice from receiving this medicine? Side effects that you should report to your doctor or health care professional as soon as possible:  allergic reactions like skin rash, itching or hives, swelling of the face, lips, or tongue  breathing problems  changes in blood pressure  feeling faint or lightheaded, falls  fever or chills  flushing, sweating, or hot feelings  swelling of the ankles or feet Side effects that usually do not require medical attention (report to your doctor or health care professional if they continue or are bothersome):  diarrhea  headache  nausea, vomiting  stomach pain This list may not describe all possible side effects. Call your doctor for medical advice about side effects. You may report side effects to FDA at 1-800-FDA-1088. Where should I keep my medicine? This drug is given in a hospital or clinic and will not be stored at home. NOTE: This sheet is a summary. It may not cover all possible information. If you have questions about this medicine, talk to your doctor, pharmacist, or health care provider.  2021 Elsevier/Gold Standard (2016-03-07 20:21:10)

## 2020-04-17 NOTE — Progress Notes (Signed)
Radiation Oncology         [phone] ________________________________  Name: Michael [name] MRN: [id number]  Date: 04/17/2020  DOB: 1955-12-09  Follow-Up Visit Note  Outpatient  CC: [name], MD  Cindra Presume, MD  Diagnosis and Prior Radiotherapy:    ICD-10-CM   1. Basal cell carcinoma of canthus, right  C44.111     CHIEF COMPLAINT: Here for follow-up and surveillance of facial skin cancer  Narrative:  The patient returns today for routine follow-up.      He is doing well overall.  The skin in the radiation field is healed nicely.  He still has watering of his right eye and he rubs his eyes reflexively.  He has an ophthalmologist and he plans to make a follow-up appointment with them.  He does not have a primary dermatologist.  ALLERGIES:  has No Known Allergies.  Meds: Current Outpatient Medications  Medication Sig Dispense Refill  . aspirin EC 81 MG tablet Take 81 mg by mouth daily.    Marland Kitchen atorvastatin (LIPITOR) 40 MG tablet Take 1 tablet (40 mg total) by mouth every evening. 90 tablet 3  . ferrous sulfate 325 (65 FE) MG EC tablet Take 1 tablet (325 mg total) by mouth daily with breakfast. 30 tablet 3  . isosorbide mononitrate (IMDUR) 30 MG 24 hr tablet Take 1 tablet (30 mg total) by mouth every evening. 90 tablet 3  . lisinopril (ZESTRIL) 10 MG tablet Take 10 mg by mouth daily.    . metoprolol succinate (TOPROL-XL) 25 MG 24 hr tablet Take 1 tablet (25 mg total) by mouth every evening. 90 tablet 3  . nitroGLYCERIN (NITROSTAT) 0.4 MG SL tablet Place 1 tablet (0.4 mg total) under the tongue every 5 (five) minutes as needed for chest pain. 90 tablet 3  . oxybutynin (DITROPAN) 5 MG tablet Take 1 tablet (5 mg total) by mouth 3 (three) times daily. 30 tablet 0  . temazepam (RESTORIL) 15 MG capsule Take 1 capsule (15 mg total) by mouth at bedtime as needed for sleep. 30 capsule 0   No current facility-administered medications for this encounter.    Physical Findings: The  patient is in no acute distress. Patient is alert and oriented.  weight is 179 lb 6.4 oz (81.4 kg). His tympanic temperature is 97.5 F (36.4 C) (abnormal). His blood pressure is 161/83 (abnormal) and his pulse is 64. His respiration is 18 and oxygen saturation is 100%. .     He has mild erythema of the right sclera and mild watering of the eye.  He is rubbing his eye at times.  He has no evidence of residual disease in the right medial canthus/upper reconstructed nose.  There is some mild dry crusting in this area that does not appear to be disease of neoplastic nature   Lab Findings: Lab Results  Component Value Date   WBC 9.0 03/24/2020   HGB 10.4 (L) 03/24/2020   HCT 33.9 (L) 03/24/2020   MCV 89.4 03/24/2020   PLT 295 03/24/2020    Radiographic Findings: No results found.  Impression/Plan: Complete response to radiation therapy.  We will refer him to a primary dermatologist.  The patient desires to see physicians in [place] if possible as transportation is a challenge for him.  He understands that he needs to see a dermatologist regularly for full skin exams and surveillance.  I will see him back on an as-needed basis.  He will make an appointment with his ophthalmologist and  he knows to stop rubbing his right eye.  I recommended he use artificial tears every few hours to reduce the watering of his eyes.  He is pleased with this plan and knows to call if he has any issues in the future.    On date of service, in total, I spent 20 minutes on this encounter. Patient was seen in person.  _____________________________________   Eppie Gibson, MD

## 2020-04-20 ENCOUNTER — Telehealth: Payer: Self-pay | Admitting: *Deleted

## 2020-04-20 NOTE — Telephone Encounter (Signed)
Called patient to inform that Dr. [facility] (dermatology) would like for him to call to arrange this appt. due to the fact that he has already been seen there, patient verified understanding this.

## 2020-04-29 ENCOUNTER — Encounter: Payer: Self-pay | Admitting: General Practice

## 2020-04-29 NOTE — Progress Notes (Signed)
[facility] CSW Progress Notes  Email from Robley Fries, [employer].  Patient is requesting a ride to appointment tomorrow at [facility] - was referred by Dr Isidore Moos for ongoing surveillance post cancer treatment.  As he is not in active treatment, reminded patient that he can use his other resources including Medicaid and [facility] Medicare, both of which provide transportation which must be arranged 72 hours in advance.  Of note, patient states he has someone to take him tomorrow and does not need wheelchair transport at this time.  Edwyna Shell, LCSW Clinical Social Worker Phone:  [phone]

## 2020-05-20 NOTE — Progress Notes (Signed)
  Patient Name: NIKOLOZ HUY MRN: [id number] DOB: [birth year] Referring Physician: Mingo Amber Date of Service: 03/20/2020 [facility] [place]                                                        End Of Treatment Note  Diagnoses: C44.111-Basal cell carcinoma of skin of unspecified eyelid, including canthus  Cancer Staging: Cancer Staging Basal cell carcinoma of canthus, right Staging form: Cutaneous Carcinoma of the Head and Neck, AJCC 8th Edition - Clinical stage from 12/03/2019: Stage IV (cT4a, cN0, cM0) - Signed by Eppie Gibson, MD on 12/06/2019 Extraosseous extension: Present   Intent: Curative  Radiation Treatment Dates: 02/14/2020 through 03/20/2020 Site Technique Total Dose (Gy) Dose per Fx (Gy) Completed Fx Beam Energies  Canthus, Right Inner: [id number]_canthus IMRT 40/40 4 10/10 6X   Narrative: The patient tolerated radiation therapy relatively well.   Plan: The patient will follow-up with radiation oncology in 4mo. -----------------------------------  Eppie Gibson, MD

## 2020-05-21 ENCOUNTER — Ambulatory Visit (INDEPENDENT_AMBULATORY_CARE_PROVIDER_SITE_OTHER): Payer: Medicare Other | Admitting: General Surgery

## 2020-05-21 ENCOUNTER — Other Ambulatory Visit: Payer: Self-pay

## 2020-05-21 ENCOUNTER — Encounter: Payer: Self-pay | Admitting: General Surgery

## 2020-05-21 VITALS — BP 179/97 | HR 69 | Temp 98.6°F | Resp 16 | Ht 72.0 in | Wt 181.0 lb

## 2020-05-21 DIAGNOSIS — R2232 Localized swelling, mass and lump, left upper limb: Secondary | ICD-10-CM | POA: Diagnosis not present

## 2020-05-21 NOTE — Patient Instructions (Signed)
Lipoma  A lipoma is a noncancerous (benign) tumor that is made up of fat cells. This is a very common type of soft-tissue growth. Lipomas are usually found under the skin (subcutaneous). They may occur in any tissue of the body that contains fat. Common areas for lipomas to appear include the back, arms, shoulders, buttocks, and thighs. Lipomas grow slowly, and they are usually painless. Most lipomas do not cause problems and do not require treatment. What are the causes? The cause of this condition is not known. What increases the risk? You are more likely to develop this condition if:  You are 40-60 years old.  You have a family history of lipomas. What are the signs or symptoms? A lipoma usually appears as a small, round bump under the skin. In most cases, the lump will:  Feel soft or rubbery.  Not cause pain or other symptoms. However, if a lipoma is located in an area where it pushes on nerves, it can become painful or cause other symptoms. How is this diagnosed? A lipoma can usually be diagnosed with a physical exam. You may also have tests to confirm the diagnosis and to rule out other conditions. Tests may include:  Imaging tests, such as a CT scan or an MRI.  Removal of a tissue sample to be looked at under a microscope (biopsy). How is this treated? Treatment for this condition depends on the size of the lipoma and whether it is causing any symptoms.  For small lipomas that are not causing problems, no treatment is needed.  If a lipoma is bigger or it causes problems, surgery may be done to remove the lipoma. Lipomas can also be removed to improve appearance. Most often, the procedure is done after applying a medicine that numbs the area (local anesthetic).  Liposuction may be done to reduce the size of the lipoma before it is removed through surgery, or it may be done to remove the lipoma. Lipomas are removed with this method in order to limit incision size and scarring. A  liposuction tube is inserted through a small incision into the lipoma, and the contents of the lipoma are removed through the tube with suction. Follow these instructions at home:  Watch your lipoma for any changes.  Keep all follow-up visits as told by your health care provider. This is important. Contact a health care provider if:  Your lipoma becomes larger or hard.  Your lipoma becomes painful, red, or increasingly swollen. These could be signs of infection or a more serious condition. Get help right away if:  You develop tingling or numbness in an area near the lipoma. This could indicate that the lipoma is causing nerve damage. Summary  A lipoma is a noncancerous tumor that is made up of fat cells.  Most lipomas do not cause problems and do not require treatment.  If a lipoma is bigger or it causes problems, surgery may be done to remove the lipoma.  Contact a health care provider if your lipoma becomes larger or hard, or if it becomes painful, red, or increasingly swollen. Pain, redness, and swelling could be signs of infection or a more serious condition. This information is not intended to replace advice given to you by your health care provider. Make sure you discuss any questions you have with your health care provider. Document Revised: 09/03/2018 Document Reviewed: 09/03/2018 Elsevier Patient Education  2021 Elsevier Inc.  

## 2020-05-22 NOTE — Progress Notes (Signed)
Michael [name]; [id number]; 10/07/1955   HPI Patient is a 65 year old white male who was referred to my care by Michael [name] for evaluation and treatment of a large mass over the left shoulder.  Patient states is been present for over a decade.  It is unknown whether it is an increased in size over the past few years.  It is nontender.  It is never drained.  It has remained soft. Past Medical History:  Diagnosis Date  . Carotid artery disease ([place])   . Chronic diastolic heart failure ([place])   . CKD (chronic kidney disease) stage 3, GFR 30-59 ml/min ([facility])   . COPD (chronic obstructive pulmonary disease) ([place])   . Coronary artery disease   . Essential hypertension   . Head and neck cancer ([place]) 2019   Right facial basal cell carcinoma s/p resection with right partial mastectomy and partal rhinectomy with skin graft (06/13/17)  . Iron deficiency anemia   . Urinary retention     Past Surgical History:  Procedure Laterality Date  . ANKLE CLOSED REDUCTION Right    open reduction  . BASAL CELL CARCINOMA EXCISION  2019   at [facility]  . COLONOSCOPY N/A 05/31/2017   Procedure: COLONOSCOPY;  Surgeon: Michael Dolin, MD;  Location: [facility];  Service: Endoscopy;  Laterality: N/A;  2:45pm  . CYSTOSCOPY N/A 10/29/2018   Procedure: Michael [name];  Surgeon: Michael Mons, MD;  Location: [facility] OR[facility];  Service: Urology;  Laterality: N/A;  . POLYPECTOMY  05/31/2017   Procedure: POLYPECTOMY;  Surgeon: Michael Dolin, MD;  Location: [facility];  Service: Endoscopy;;  . TONSILLECTOMY    . XI ROBOTIC ASSISTED SIMPLE PROSTATECTOMY N/A 10/29/2018   Procedure: XI ROBOTIC ASSISTED SIMPLE PROSTATECTOMY;  Surgeon: Michael Gustin, MD;  Location: [facility];  Service: Urology;  Laterality: N/A;    Family History  Problem Relation Age of Onset  . Stroke Father   . Cirrhosis Mother   . Colon cancer Neg Hx     Current Outpatient Medications on File Prior to Visit  Medication Sig Dispense  Refill  . aspirin EC 81 MG tablet Take 81 mg by mouth daily.    Marland Kitchen atorvastatin (LIPITOR) 40 MG tablet Take 1 tablet (40 mg total) by mouth every evening. 90 tablet 3  . ferrous sulfate 325 (65 FE) MG EC tablet Take 1 tablet (325 mg total) by mouth daily with breakfast. 30 tablet 3  . isosorbide mononitrate (IMDUR) 30 MG 24 hr tablet Take 1 tablet (30 mg total) by mouth every evening. 90 tablet 3  . lisinopril (ZESTRIL) 10 MG tablet Take 10 mg by mouth daily.    . metoprolol succinate (TOPROL-XL) 25 MG 24 hr tablet Take 1 tablet (25 mg total) by mouth every evening. 90 tablet 3  . nitroGLYCERIN (NITROSTAT) 0.4 MG SL tablet Place 1 tablet (0.4 mg total) under the tongue every 5 (five) minutes as needed for chest pain. 90 tablet 3  . oxybutynin (DITROPAN) 5 MG tablet Take 1 tablet (5 mg total) by mouth 3 (three) times daily. 30 tablet 0  . temazepam (RESTORIL) 15 MG capsule Take 1 capsule (15 mg total) by mouth at bedtime as needed for sleep. 30 capsule 0   No current facility-administered medications on file prior to visit.    No Known Allergies  Social History   Substance and Sexual Activity  Alcohol Use Not Currently    Social History   Tobacco Use  Smoking Status Former  Smoker  . Packs/day: 0.50  . Years: 40.00  . Pack years: 20.00  . Types: Cigarettes  . Quit date: 12/01/2019  . Years since quitting: 0.4  Smokeless Tobacco Never Used    Review of Systems  Constitutional: Negative.   HENT: Negative.   Eyes: Negative.   Respiratory: Negative.   Cardiovascular: Negative.   Gastrointestinal: Negative.   Genitourinary: Negative.   Musculoskeletal: Negative.   Skin: Negative.   Neurological: Negative.   Endo/Heme/Allergies: Negative.   Psychiatric/Behavioral: Negative.     Objective   Vitals:   05/21/20 1007  BP: (!) 179/97  Pulse: 69  Resp: 16  Temp: 98.6 F (37 C)  SpO2: 98%    Physical Exam Vitals reviewed.  Constitutional:      Appearance: Normal  appearance. He is normal weight. He is not ill-appearing.  HENT:     Head: Normocephalic and atraumatic.  Neck:     Vascular: Carotid bruit present.     Comments: Right carotid bruit. Cardiovascular:     Rate and Rhythm: Normal rate and regular rhythm.     Heart sounds: Normal heart sounds. No murmur heard. No friction rub. No gallop.   Pulmonary:     Effort: Pulmonary effort is normal. No respiratory distress.     Breath sounds: Normal breath sounds. No stridor. No wheezing, rhonchi or rales.  Musculoskeletal:     Cervical back: Neck supple. No tenderness.     Comments: A large 6x7cm soft well circumscribed mobile soft tissue mass is noted over the left deltoid of the arm.  No firmness is noted.  Range of motion of left shoulder within normal limits.  Skin:    General: Skin is warm and dry.  Neurological:     General: No focal deficit present.     Mental Status: He is alert and oriented to person, place, and time.     Assessment  Soft tissue mass, left arm.  Clinically appears to be a lipoma, though given its size, I cannot rule out liposarcoma. Plan   We will get MRI of left arm.  Further management is pending those results.

## 2020-06-12 ENCOUNTER — Ambulatory Visit (HOSPITAL_COMMUNITY)
Admission: RE | Admit: 2020-06-12 | Discharge: 2020-06-12 | Disposition: A | Discharge status: Home / Self Care | Payer: Medicare Other | Source: Ambulatory Visit | Attending: General Surgery | Admitting: General Surgery

## 2020-06-12 DIAGNOSIS — R2232 Localized swelling, mass and lump, left upper limb: Secondary | ICD-10-CM | POA: Diagnosis not present

## 2020-06-12 DIAGNOSIS — S46011A Strain of muscle(s) and tendon(s) of the rotator cuff of right shoulder, initial encounter: Secondary | ICD-10-CM | POA: Diagnosis not present

## 2020-06-12 MED ORDER — GADOBUTROL 1 MMOL/ML IV SOLN
9.0000 mL | Freq: Once | INTRAVENOUS | Status: AC | PRN
  Administered 2020-06-12: 9 mL via INTRAVENOUS

## 2020-06-17 ENCOUNTER — Encounter: Payer: Self-pay | Admitting: Urology

## 2020-06-17 ENCOUNTER — Ambulatory Visit (INDEPENDENT_AMBULATORY_CARE_PROVIDER_SITE_OTHER): Payer: Medicare Other | Admitting: Urology

## 2020-06-17 ENCOUNTER — Other Ambulatory Visit: Payer: Self-pay

## 2020-06-17 VITALS — BP 166/84 | HR 80 | Temp 98.8°F | Ht 72.0 in | Wt 181.0 lb

## 2020-06-17 DIAGNOSIS — R339 Retention of urine, unspecified: Secondary | ICD-10-CM

## 2020-06-17 DIAGNOSIS — N401 Enlarged prostate with lower urinary tract symptoms: Secondary | ICD-10-CM | POA: Diagnosis not present

## 2020-06-17 DIAGNOSIS — N3000 Acute cystitis without hematuria: Secondary | ICD-10-CM

## 2020-06-17 DIAGNOSIS — N138 Other obstructive and reflux uropathy: Secondary | ICD-10-CM

## 2020-06-17 LAB — MICROSCOPIC EXAMINATION
Epithelial Cells (non renal): >10 /hpf — AB (ref 0–10)
Renal Epithel, UA: NONE SEEN /hpf
WBC, UA: >30 /hpf — AB (ref 0–5)

## 2020-06-17 LAB — URINALYSIS, ROUTINE W REFLEX MICROSCOPIC
Bilirubin, UA: NEGATIVE
Glucose, UA: NEGATIVE
Ketones, UA: NEGATIVE
Nitrite, UA: POSITIVE — AB
Specific Gravity, UA: 1.025 (ref 1.005–1.030)
Urobilinogen, Ur: 1 mg/dL (ref 0.2–1.0)
pH, UA: 6 (ref 5.0–7.5)

## 2020-06-17 LAB — BLADDER SCAN AMB NON-IMAGING: Scan Result: 22

## 2020-06-17 NOTE — Progress Notes (Signed)
post void residual=22  Urological Symptom Review  Patient is experiencing the following symptoms: none   Review of Systems  Gastrointestinal (upper)  : Negative for upper GI symptoms  Gastrointestinal (lower) : Negative for lower GI symptoms  Constitutional : Negative for symptoms  Skin: Negative for skin symptoms  Eyes: Negative for eye symptoms  Ear/Nose/Throat : Negative for Ear/Nose/Throat symptoms  Hematologic/Lymphatic: Negative for Hematologic/Lymphatic symptoms  Cardiovascular : Negative for cardiovascular symptoms  Respiratory : Negative for respiratory symptoms  Endocrine: Negative for endocrine symptoms  Musculoskeletal: Negative for musculoskeletal symptoms  Neurological: Negative for neurological symptoms  Psychologic: Negative for psychiatric symptoms

## 2020-06-17 NOTE — Progress Notes (Signed)
06/17/2020 10:01 AM   Michael [name] [birth year] [id number]  Referring provider: [name], NP [place],  [place] [address]  Followup BPH  HPI: Mr Dino is a 65QH here for followup for BPH. He is doing well after simple prostatectomy. No hematuria or dysuria. Nocturia 2x. IPSS 5 QOL 0. No other complaints    PMH: Past Medical History:  Diagnosis Date  . Carotid artery disease ([place])   . Chronic diastolic heart failure ([place])   . CKD (chronic kidney disease) stage 3, GFR 30-59 ml/min (HCC)   . COPD (chronic obstructive pulmonary disease) ([place])   . Coronary artery disease   . Essential hypertension   . Head and neck cancer ([place]) 2019   Right facial basal cell carcinoma s/p resection with right partial mastectomy and partal rhinectomy with skin graft (06/13/17)  . Iron deficiency anemia   . Urinary retention     Surgical History: Past Surgical History:  Procedure Laterality Date  . ANKLE CLOSED REDUCTION Right    open reduction  . BASAL CELL CARCINOMA EXCISION  2019   [facility]  . COLONOSCOPY N/A 05/31/2017   Procedure: COLONOSCOPY;  Surgeon: Daneil Dolin, MD;  [facility];  Service: Endoscopy;  Laterality: N/A;  2:45pm  . CYSTOSCOPY N/A 10/29/2018   Procedure: Jilda Panda;  Surgeon: Ceasar Mons, MD;  [facility];  Service: Urology;  Laterality: N/A;  . POLYPECTOMY  05/31/2017   Procedure: POLYPECTOMY;  Surgeon: Daneil Dolin, MD;  [facility];  Service: Endoscopy;;  . TONSILLECTOMY    . XI ROBOTIC ASSISTED SIMPLE PROSTATECTOMY N/A 10/29/2018   Procedure: XI ROBOTIC ASSISTED SIMPLE PROSTATECTOMY;  Surgeon: Cleon Gustin, MD;  [facility];  Service: Urology;  Laterality: N/A;    Home Medications:  Allergies as of 06/17/2020   No Known Allergies     Medication List       Accurate as of Jun 17, 2020 10:01 AM. If you have any questions, ask your nurse or doctor.         aspirin EC 81 MG tablet Take 81 mg by mouth daily.   atorvastatin 40 MG tablet Commonly known as: LIPITOR Take 1 tablet (40 mg total) by mouth every evening.   ferrous sulfate 325 (65 FE) MG EC tablet Take 1 tablet (325 mg total) by mouth daily with breakfast.   isosorbide mononitrate 30 MG 24 hr tablet Commonly known as: IMDUR Take 1 tablet (30 mg total) by mouth every evening.   lisinopril 10 MG tablet Commonly known as: ZESTRIL Take 10 mg by mouth daily.   metoprolol succinate 25 MG 24 hr tablet Commonly known as: TOPROL-XL Take 1 tablet (25 mg total) by mouth every evening.   nitroGLYCERIN 0.4 MG SL tablet Commonly known as: NITROSTAT Place 1 tablet (0.4 mg total) under the tongue every 5 (five) minutes as needed for chest pain.   oxybutynin 5 MG tablet Commonly known as: DITROPAN Take 1 tablet (5 mg total) by mouth 3 (three) times daily.   temazepam 15 MG capsule Commonly known as: RESTORIL Take 1 capsule (15 mg total) by mouth at bedtime as needed for sleep.       Allergies: No Known Allergies  Family History: Family History  Problem Relation Age of Onset  . Stroke Father   . Cirrhosis Mother   . Colon cancer Neg Hx     Social History:  reports that he  quit smoking about 6 months ago. His smoking use included cigarettes. He has a 20.00 pack-year smoking history. He has never used smokeless tobacco. He reports previous alcohol use. He reports that he does not use drugs.  ROS: All other review of systems were reviewed and are negative except what is noted above in HPI  Physical Exam: BP (!) 166/84   Pulse 80   Temp 98.8 F (37.1 C)   Ht 6' (1.829 m)   Wt 181 lb (82.1 kg)   BMI 24.55 kg/m   Constitutional:  Alert and oriented, No acute distress. HEENT: [place] AT, moist mucus membranes.  Trachea midline, no masses. Cardiovascular: No clubbing, cyanosis, or edema. Respiratory: Normal respiratory effort, no increased work of breathing. GI: Abdomen is  soft, nontender, nondistended, no abdominal masses GU: No CVA tenderness.  Lymph: No cervical or inguinal lymphadenopathy. Skin: No rashes, bruises or suspicious lesions. Neurologic: Grossly intact, no focal deficits, moving all 4 extremities. Psychiatric: Normal mood and affect.  Laboratory Data: Lab Results  Component Value Date   WBC 9.0 03/24/2020   HGB 10.4 (L) 03/24/2020   HCT 33.9 (L) 03/24/2020   MCV 89.4 03/24/2020   PLT 295 03/24/2020    Lab Results  Component Value Date   CREATININE 1.76 (H) 03/24/2020    Lab Results  Component Value Date   PSA 1.3 06/19/2019    No results found for: TESTOSTERONE  Lab Results  Component Value Date   HGBA1C 5.3 04/02/2017    Urinalysis    Component Value Date/Time   COLORURINE AMBER (A) 06/22/2018 1340   APPEARANCEUR Clear 11/06/2019 1356   LABSPEC 1.015 06/22/2018 1340   LABSPEC 1.018 04/23/2011 1044   PHURINE 8.0 06/22/2018 1340   GLUCOSEU Negative 11/06/2019 1356   GLUCOSEU Negative 04/23/2011 1044   HGBUR NEGATIVE 06/22/2018 1340   BILIRUBINUR Negative 11/06/2019 1356   BILIRUBINUR Negative 04/23/2011 1044   [place] 06/22/2018 1340   PROTEINUR 2+ (A) 11/06/2019 1356   PROTEINUR 100 (A) 06/22/2018 1340   NITRITE Positive (A) 11/06/2019 1356   NITRITE NEGATIVE 06/22/2018 1340   LEUKOCYTESUR 1+ (A) 11/06/2019 1356   LEUKOCYTESUR LARGE (A) 06/22/2018 1340   LEUKOCYTESUR Trace 04/23/2011 1044    Lab Results  Component Value Date   LABMICR See below: 11/06/2019   WBCUA >30 (A) 11/06/2019   LABEPIT 0-10 11/06/2019   BACTERIA Many (A) 11/06/2019    Pertinent Imaging:  No results found for this or any previous visit.  No results found for this or any previous visit.  No results found for this or any previous visit.  No results found for this or any previous visit.  Results for orders placed during the hospital encounter of 04/01/17  US RENAL  Narrative CLINICAL DATA:  Acute kidney  injury  EXAM: RENAL / URINARY TRACT ULTRASOUND COMPLETE  COMPARISON:  None.  FINDINGS: Right Kidney:  Length: 10 cm. Echogenicity within normal limits. 1.6 x 1.2 x 1.2 cm hypoechoic right renal mass without internal Doppler flow, posterior acoustic enhancement or posterior acoustic shadowing. No hydronephrosis visualized.  Left Kidney:  Length: 14.6 cm. Echogenicity within normal limits. No mass or hydronephrosis visualized.  Bladder:  Appears normal for degree of bladder distention.  IMPRESSION: 1. No obstructive uropathy. 2. 1.6 x 1.2 x 1.2 cm hypoechoic right renal mass of indeterminate etiology. Appearance is likely to reflect a mildly complicated cyst. Recommend nonemergent MRI of the abdomen for better characterization.   Electronically Signed By: Kathreen Devoid On:  04/02/2017 12:31  No results found for this or any previous visit.  Results for orders placed during the hospital encounter of 07/30/18  CT HEMATURIA WORKUP  Narrative CLINICAL DATA:  History of urolithiasis including bladder stone. Reported hematuria. Elevated PSA. Upcoming prostate biopsy.  EXAM: CT ABDOMEN AND PELVIS WITHOUT AND WITH CONTRAST  TECHNIQUE: Multidetector CT imaging of the abdomen and pelvis was performed following the standard protocol before and following the bolus administration of intravenous contrast.  CONTRAST:  18mL ISOVUE-370 IOPAMIDOL (ISOVUE-370) INJECTION 76%  COMPARISON:  None.  FINDINGS: Lower chest: Tiny calcified granulomas in the basilar right lower lobe. Coronary atherosclerosis.  Hepatobiliary: Normal liver size. Simple 1.4 cm right liver dome cyst. A few additional scattered subcentimeter hypodense liver lesions, too small to characterize, which require no follow-up unless the patient has risk factors for liver malignancy. Cholelithiasis. No biliary ductal dilatation.  Pancreas: Normal, with no mass or duct dilation.  Spleen: Normal size. No  mass.  Adrenals/Urinary Tract: Normal adrenals. No renal stones. Mild right hydroureteronephrosis to the level of the right ureterovesical junction. No ureteral stones. Normal caliber left ureter. No left hydronephrosis. Asymmetric moderate right renal parenchymal atrophy. Normal size left kidney. Partially duplicated left renal collecting system to the level of the mid left pelvic ureter. Smooth urothelial wall thickening and enhancement throughout the right renal collecting system and right ureter. No left urinary tract urothelial wall thickening. No discrete filling defects in the bilateral collecting system or ureters. Nonspecific diffuse bladder wall thickening. Solitary 3.0 cm posterior left bladder stone. No bladder diverticula. Mass-effect on the bladder base by the enlarged nodular median lobe of the prostate. Otherwise no focal bladder mass.  Stomach/Bowel: Normal non-distended stomach. Normal caliber small bowel with no small bowel wall thickening. Normal appendix. Normal large bowel with no diverticulosis, large bowel wall thickening or pericolonic fat stranding.  Vascular/Lymphatic: Atherosclerotic abdominal aorta with 3.3 cm infrarenal abdominal aortic aneurysm. Patent portal, splenic, hepatic and renal veins. Retroaortic left renal vein. Enlarged 1.7 cm right common iliac node (series 4/image 49). Enlarged 1.3 cm left common iliac node (series 4/image 49). Mild porta hepatis adenopathy up to 1.4 cm (series 4/image 16).  Reproductive: Markedly enlarged prostate measuring 8.5 x 7.4 x 7.9 cm (258 cc).  Other: No pneumoperitoneum, ascites or focal fluid collection. Tiny fat containing umbilical hernia.  Musculoskeletal: No aggressive appearing focal osseous lesions. Mild thoracolumbar spondylosis.  IMPRESSION: 1. Markedly enlarged prostate with mass-effect on the bladder base by the enlarged nodular median lobe of the prostate. 2. Solitary large 3 cm bladder stone.   No renal or ureteral stones. 3. Mild right hydroureteronephrosis to the level of the right UVJ. Nonspecific smooth diffuse urothelial wall thickening and enhancement throughout the right renal collecting system and right ureter, more likely inflammatory. Asymmetric moderate right renal parenchymal atrophy suggests a longstanding process. Obstruction may be due to the nodular median lobe of the prostate, which slightly asymmetrically involves the right bladder base. 4. Mild porta hepatis and bilateral common iliac lymphadenopathy, nonspecific. Suggest attention on follow-up CT abdomen/pelvis with oral and IV contrast in 3 months. This recommendation follows ACR consensus guidelines: White Paper of the ACR Incidental Findings Committee II on Splenic and Nodal Findings. J Am Coll Radiol [id number]. 5. Infrarenal 3.3 cm Abdominal Aortic Aneurysm (ICD10-I71.9). Recommend follow-up aortic ultrasound in 3 years. This recommendation follows ACR consensus guidelines: White Paper of the ACR Incidental Findings Committee II on Vascular Findings. J Am Coll Radiol 2013; 10:789-794. 6.  Aortic Atherosclerosis (ICD10-I70.0).  Electronically Signed By: Ilona Sorrel M.D. On: 07/30/2018 10:33  No results found for this or any previous visit.   Assessment & Plan:    1. Benign prostatic hyperplasia with urinary obstruction -patient has minimal LUTS after simple prostate  - BLADDER SCAN AMB NON-IMAGING - Urinalysis, Routine w reflex microscopic  2. Acute cystitis without hematuria -urine for culture - Urine Culture  3. Urinary retention -resolved   No follow-ups on file.  Nicolette Bang, MD  [facility] [place]

## 2020-06-17 NOTE — Patient Instructions (Signed)

## 2020-06-22 ENCOUNTER — Other Ambulatory Visit (HOSPITAL_COMMUNITY): Payer: Self-pay | Admitting: Urology

## 2020-06-22 DIAGNOSIS — G479 Sleep disorder, unspecified: Secondary | ICD-10-CM

## 2020-06-24 ENCOUNTER — Telehealth: Payer: Self-pay

## 2020-06-24 ENCOUNTER — Ambulatory Visit: Payer: Medicare Other | Admitting: Urology

## 2020-06-24 DIAGNOSIS — N3000 Acute cystitis without hematuria: Secondary | ICD-10-CM

## 2020-06-24 LAB — URINE CULTURE

## 2020-06-24 MED ORDER — SULFAMETHOXAZOLE-TRIMETHOPRIM 800-160 MG PO TABS: 1.0000 | ORAL_TABLET | Freq: Two times a day (BID) | ORAL | 0 refills | Status: DC

## 2020-06-24 NOTE — Telephone Encounter (Signed)
Patient called and made aware of antibiotic sent in for UTI per Dr. Alyson Ingles.

## 2020-07-02 ENCOUNTER — Telehealth: Payer: Self-pay

## 2020-07-02 NOTE — Telephone Encounter (Signed)
Patient called and notified. Patient will continue bactrim rx

## 2020-07-02 NOTE — Progress Notes (Signed)
Cardiology Office Note  Date: 07/03/2020   ID: Michael [name], DOB February 14, 1955, MRN [id number]  PCP:  [name], MD  Cardiologist:  Rozann Lesches, MD Electrophysiologist:  None   Chief Complaint  Patient presents with  . Cardiac follow-up    History of Present Illness: Michael [name] is a 65 y.o. male last seen in February by Ms. Strader PA-C.  He presents for a routine visit.  Reports no angina symptoms or nitroglycerin use with typical ADLs.  We went over his medications which are listed below, he states that he has been compliant.  He is due for follow-up with his PCP however and repeat lipid panel.  He was seen by Dr. Oneida Alar in May for follow-up of carotid artery disease.  He has had evidence of greater than 80% RICA stenosis that has been stable and asymptomatic.  Carotid stenting discussed with patient has wanted to defer so far.  Past Medical History:  Diagnosis Date  . Carotid artery disease ([place])   . Chronic diastolic heart failure ([place])   . CKD (chronic kidney disease) stage 3, GFR 30-59 ml/min (HCC)   . COPD (chronic obstructive pulmonary disease) ([place])   . Coronary artery disease   . Essential hypertension   . Head and neck cancer ([place]) 2019   Right facial basal cell carcinoma s/p resection with right partial mastectomy and partal rhinectomy with skin graft (06/13/17)  . Iron deficiency anemia   . Urinary retention     Past Surgical History:  Procedure Laterality Date  . ANKLE CLOSED REDUCTION Right    open reduction  . BASAL CELL CARCINOMA EXCISION  2019   [facility]  . COLONOSCOPY N/A 05/31/2017   Procedure: COLONOSCOPY;  Surgeon: Daneil Dolin, MD;  [facility];  Service: Endoscopy;  Laterality: N/A;  2:45pm  . CYSTOSCOPY N/A 10/29/2018   Procedure: Jilda Panda;  Surgeon: Ceasar Mons, MD;  [facility];  Service: Urology;  Laterality: N/A;  . POLYPECTOMY  05/31/2017   Procedure: POLYPECTOMY;  Surgeon: Daneil Dolin, MD;  [facility];  Service: Endoscopy;;  . TONSILLECTOMY    . XI ROBOTIC ASSISTED SIMPLE PROSTATECTOMY N/A 10/29/2018   Procedure: XI ROBOTIC ASSISTED SIMPLE PROSTATECTOMY;  Surgeon: Cleon Gustin, MD;  [facility];  Service: Urology;  Laterality: N/A;    Current Outpatient Medications  Medication Sig Dispense Refill  . aspirin EC 81 MG tablet Take 81 mg by mouth daily.    Marland Kitchen atorvastatin (LIPITOR) 40 MG tablet Take 1 tablet (40 mg total) by mouth every evening. 90 tablet 3  . ferrous sulfate 325 (65 FE) MG EC tablet Take 1 tablet (325 mg total) by mouth daily with breakfast. 30 tablet 3  . isosorbide mononitrate (IMDUR) 30 MG 24 hr tablet Take 1 tablet (30 mg total) by mouth every evening. 90 tablet 3  . lisinopril (ZESTRIL) 10 MG tablet Take 10 mg by mouth daily.    . metoprolol succinate (TOPROL-XL) 25 MG 24 hr tablet Take 1 tablet (25 mg total) by mouth every evening. 90 tablet 3  . nitroGLYCERIN (NITROSTAT) 0.4 MG SL tablet Place 1 tablet (0.4 mg total) under the tongue every 5 (five) minutes as needed for chest pain. 90 tablet 3  . sulfamethoxazole-trimethoprim (BACTRIM DS) 800-160 MG tablet Take 1 tablet by mouth every 12 (twelve) hours. 14 tablet 0  . temazepam (RESTORIL) 15 MG capsule Take 1 capsule (15 mg total) by mouth at  bedtime as needed for sleep. 30 capsule 0   No current facility-administered medications for this visit.   Allergies:  Patient has no known allergies.   ROS: No palpitations or syncope.  No focal motor weakness or speech changes over baseline.  Physical Exam: VS:  BP 126/74   Pulse 100   Wt 173 lb (78.5 kg)   SpO2 98%   BMI 23.46 kg/m , BMI Body mass index is 23.46 kg/m.  Wt Readings from Last 3 Encounters:  07/03/20 173 lb (78.5 kg)  06/17/20 181 lb (82.1 kg)  05/21/20 181 lb (82.1 kg)    General: Patient appears comfortable at rest. HEENT: Right facial scars from previous ENT surgery, wearing a mask. Neck: Supple, no  elevated JVP, bilateral carotid bruits. Lungs: Clear to auscultation, nonlabored breathing at rest. Cardiac: Regular rate and rhythm, no S3, 1/6 systolic murmur, no pericardial rub. Extremities: No pitting edema.  ECG:  An ECG dated 12/03/2019 was personally reviewed today and demonstrated:  Sinus rhythm with prolonged PR interval, diffuse ST-T wave abnormalities.  Recent Labwork: 03/24/2020: ALT 23; AST 23; BUN 33; Creatinine, Ser 1.76; Hemoglobin 10.4; Platelets 295; Potassium 4.2; Sodium 140     Component Value Date/Time   CHOL 194 04/02/2017 0541   TRIG 49 04/02/2017 0541   HDL 42 04/02/2017 0541   CHOLHDL 4.6 04/02/2017 0541   VLDL 10 04/02/2017 0541   LDLCALC 142 (H) 04/02/2017 0541    Other Studies Reviewed Today:  Echocardiogram 12/05/2017: - Left ventricle: The cavity size was normal. Wall thickness was  increased in a pattern of mild LVH. Systolic function was mildly  reduced. The estimated ejection fraction was in the range of 45%  to 50%. There is hypokinesis of the basal-midinferolateral  myocardium. Features are consistent with a pseudonormal left  ventricular filling pattern, with concomitant abnormal relaxation  and increased filling pressure (grade 2 diastolic dysfunction).  - Aortic valve: Mildly calcified annulus. Trileaflet; mildly  calcified leaflets.  - Mitral valve: Mildly calcified annulus. There was mild  regurgitation.  - Left atrium: The atrium was mildly dilated.  - Tricuspid valve: There was trivial regurgitation.  - Pulmonary arteries: Systolic pressure could not be accurately  estimated.  - Pericardium, extracardiac: A trivial pericardial effusion was  identified anterior to the heart.   Assessment and Plan:  1.  Ischemic heart disease based on previous Myoview, no active angina symptoms and plan to continue medical therapy at this point.  He does not report any nitroglycerin use in the interim.  LVEF 45 to 50% range.  Continue  aspirin, Lipitor, Toprol-XL, lisinopril, and Imdur.  2.  Mixed hyperlipidemia, reports compliance with Lipitor.  I recommended that he follow-up with Dr. Nevada Crane for a repeat lipid profile.  3.  CKD stage IIIb, creatinine 1.76.  4.  Carotid artery disease with evidence of greater than 80% RICA stenosis, followed on medical therapy at this point by Dr. Oneida Alar.  Medication Adjustments/Labs and Tests Ordered: Current medicines are reviewed at length with the patient today.  Concerns regarding medicines are outlined above.   Tests Ordered: No orders of the defined types were placed in this encounter.   Medication Changes: No orders of the defined types were placed in this encounter.   Disposition:  Follow up 6 months.  Signed, Satira Sark, MD, [facility] 07/03/2020 8:29 AM    [facility] [address], [place], [place] [address] Phone: [phone]; Fax: [phone]

## 2020-07-02 NOTE — Telephone Encounter (Signed)
-----   Message from Cleon Gustin, MD sent at 06/30/2020  9:54 AM EDT ----- Bactrim Ds BID for 7 days ----- Message ----- From: Iris Pert, LPN Sent: 6/62/9476   4:07 PM EDT To: Cleon Gustin, MD  Patient started on bactrim 06/24/20

## 2020-07-03 ENCOUNTER — Ambulatory Visit (INDEPENDENT_AMBULATORY_CARE_PROVIDER_SITE_OTHER): Payer: Medicare Other | Admitting: Cardiology

## 2020-07-03 ENCOUNTER — Encounter: Payer: Self-pay | Admitting: Cardiology

## 2020-07-03 ENCOUNTER — Other Ambulatory Visit: Payer: Self-pay

## 2020-07-03 VITALS — BP 126/74 | HR 100 | Wt 173.0 lb

## 2020-07-03 DIAGNOSIS — N1832 Chronic kidney disease, stage 3b: Secondary | ICD-10-CM

## 2020-07-03 DIAGNOSIS — I25119 Atherosclerotic heart disease of native coronary artery with unspecified angina pectoris: Secondary | ICD-10-CM | POA: Diagnosis not present

## 2020-07-03 DIAGNOSIS — I5042 Chronic combined systolic (congestive) and diastolic (congestive) heart failure: Secondary | ICD-10-CM

## 2020-07-03 DIAGNOSIS — I6523 Occlusion and stenosis of bilateral carotid arteries: Secondary | ICD-10-CM | POA: Diagnosis not present

## 2020-07-03 NOTE — Patient Instructions (Signed)
Medication Instructions:  Your physician recommends that you continue on your current medications as directed. Please refer to the Current Medication list given to you today.  *If you need a refill on your cardiac medications before your next appointment, please call your pharmacy*   Lab Work: None today If you have labs (blood work) drawn today and your tests are completely normal, you will receive your results only by: . MyChart Message (if you have MyChart) OR . A paper copy in the mail If you have any lab test that is abnormal or we need to change your treatment, we will call you to review the results.   Testing/Procedures: None today   Follow-Up: At [facility], you and your health needs are our priority.  As part of our continuing mission to provide you with exceptional heart care, we have created designated Provider Care Teams.  These Care Teams include your primary Cardiologist (physician) and Advanced Practice Providers (APPs -  Physician Assistants and Nurse Practitioners) who all work together to provide you with the care you need, when you need it.  We recommend signing up for the patient portal called "MyChart".  Sign up information is provided on this After Visit Summary.  MyChart is used to connect with patients for Virtual Visits (Telemedicine).  Patients are able to view lab/test results, encounter notes, upcoming appointments, etc.  Non-urgent messages can be sent to your provider as well.   To learn more about what you can do with MyChart, go to [email or web address].    Your next appointment:   6 month(s)  The format for your next appointment:   In Person  Provider:   Samuel McDowell, MD   Other Instructions None   

## 2020-07-07 ENCOUNTER — Ambulatory Visit: Payer: Medicare Other

## 2020-07-07 ENCOUNTER — Ambulatory Visit: Payer: Medicare Other | Admitting: Orthopaedic Surgery

## 2020-07-07 ENCOUNTER — Other Ambulatory Visit: Payer: Self-pay

## 2020-07-07 ENCOUNTER — Ambulatory Visit (INDEPENDENT_AMBULATORY_CARE_PROVIDER_SITE_OTHER): Payer: Medicare Other | Admitting: Orthopedic Surgery

## 2020-07-07 ENCOUNTER — Encounter: Payer: Self-pay | Admitting: Orthopedic Surgery

## 2020-07-07 VITALS — BP 185/93 | HR 83 | Ht 72.0 in | Wt 170.0 lb

## 2020-07-07 DIAGNOSIS — R2230 Localized swelling, mass and lump, unspecified upper limb: Secondary | ICD-10-CM

## 2020-07-07 DIAGNOSIS — D1722 Benign lipomatous neoplasm of skin and subcutaneous tissue of left arm: Secondary | ICD-10-CM | POA: Diagnosis not present

## 2020-07-07 NOTE — Progress Notes (Signed)
New Patient Visit  Assessment: Michael [name] is a 65 y.o. male with the following: 1. Lipoma of left shoulder  Plan: We had an extensive discussion today regarding his left shoulder.  He has an obvious deformity, with an MRI demonstrating a large lipomatous growth, primarily within the left deltoid musculature.  He states it does not bother him.  It has been there, at a consistent size for several years.  He states that he is currently having more pain with the hernia.  I outlined the procedure, as well as any associated risks.  It is entirely up to him if he wishes to proceed.  However, I would asked that he obtains medical clearance prior to scheduling surgery.  He stated his understanding.  He will likely discuss the hernia with Dr. Arnoldo Morale in more detail prior to further consideration for removal of the lipoma in his left shoulder.  All questions were answered, and he is amenable this plan.  Follow-up as needed.   Follow-up: Return if symptoms worsen or fail to improve.  Subjective:  Chief Complaint  Patient presents with  . Mass    Mass on Lt shoulder for 5 yrs. No changes to mass    History of Present Illness: Michael [name] is a 65 y.o. male who has been referred to clinic today by Aviva Signs, MD for evaluation of a left shoulder mass.  He states its been there for at least 5 years.  He has not noticed a change in the size of the mass.  He states it does not bother him.  There have been no overlying skin changes.  No numbness or tingling in the left upper extremity.  He does not have any restrictions in his range of motion.  His health has remained relatively stable.  Currently, he has more pain in his abdomen, and has repeatedly pointed to a deformity in this area.  He states this is bothering him more than his shoulder.   Review of Systems: No fevers or chills No numbness or tingling No chest pain No shortness of breath No bowel or bladder dysfunction No GI distress No  headaches +abdominal pain   Medical History:  Past Medical History:  Diagnosis Date  . Carotid artery disease ([place])   . Chronic diastolic heart failure ([place])   . CKD (chronic kidney disease) stage 3, GFR 30-59 ml/min (HCC)   . COPD (chronic obstructive pulmonary disease) ([place])   . Coronary artery disease   . Essential hypertension   . Head and neck cancer ([place]) 2019   Right facial basal cell carcinoma s/p resection with right partial mastectomy and partal rhinectomy with skin graft (06/13/17)  . Iron deficiency anemia   . Urinary retention     Past Surgical History:  Procedure Laterality Date  . ANKLE CLOSED REDUCTION Right    open reduction  . BASAL CELL CARCINOMA EXCISION  2019   at [facility]  . COLONOSCOPY N/A 05/31/2017   Procedure: COLONOSCOPY;  Surgeon: Daneil Dolin, MD;  [facility];  Service: Endoscopy;  Laterality: N/A;  2:45pm  . CYSTOSCOPY N/A 10/29/2018   Procedure: Jilda Panda;  Surgeon: Ceasar Mons, MD;  Location: [facility];  Service: Urology;  Laterality: N/A;  . POLYPECTOMY  05/31/2017   Procedure: POLYPECTOMY;  Surgeon: Daneil Dolin, MD;  [facility];  Service: Endoscopy;;  . TONSILLECTOMY    . XI ROBOTIC ASSISTED SIMPLE PROSTATECTOMY N/A 10/29/2018   Procedure: XI ROBOTIC ASSISTED SIMPLE PROSTATECTOMY;  Surgeon: Cleon Gustin, MD;  [facility];  Service: Urology;  Laterality: N/A;    Family History  Problem Relation Age of Onset  . Stroke Father   . Cirrhosis Mother   . Colon cancer Neg Hx    Social History   Tobacco Use  . Smoking status: Former Smoker    Packs/day: 0.50    Years: 40.00    Pack years: 20.00    Types: Cigarettes    Quit date: 12/01/2019    Years since quitting: 0.6  . Smokeless tobacco: Never Used  Vaping Use  . Vaping Use: Never used  Substance Use Topics  . Alcohol use: Not Currently  . Drug use: Never    No Known Allergies  No outpatient medications have been  marked as taking for the 07/07/20 encounter (Office Visit) with Mordecai Rasmussen, MD.    Objective: BP (!) 185/93   Pulse 83   Ht 6' (1.829 m)   Wt 170 lb (77.1 kg)   BMI 23.06 kg/m   Physical Exam:  General: Alert and oriented.  No acute distress. Gait: Normal  Evaluation of the left upper extremity demonstrates an obvious deformity.  He has a noticeable growth on the anterior lateral aspect of the shoulder.  This area is firm, but compressible.  It is not tender to palpation.  The overlying skin is normal.  No rashes or erythema.  He has full range of motion of the left shoulder, compared to the contralateral side.  Strength testing is 5/5 throughout.  Sensation is intact over the axillary patch.  Deltoid strength is 5/5.  No numbness or tingling in his fingers.  Fingers warm and well-perfused.  2+ DP pulse.    IMAGING: I personally ordered and reviewed the following images   X-rays of the left shoulder were obtained in clinic today and demonstrates no acute abnormality.  There is some soft tissue swelling over the lateral shoulder area.  No glenohumeral arthritis.  There is no calcification within the soft tissue shadowing over the lateral shoulder.  Impression: Normal right shoulder x-ray, with lateral soft tissue swelling.   New Medications:  No orders of the defined types were placed in this encounter.     Mordecai Rasmussen, MD  07/07/2020 10:14 AM

## 2020-07-09 ENCOUNTER — Inpatient Hospital Stay (HOSPITAL_COMMUNITY): Payer: Medicare Other

## 2020-07-10 ENCOUNTER — Other Ambulatory Visit: Payer: Self-pay

## 2020-07-10 ENCOUNTER — Inpatient Hospital Stay (HOSPITAL_COMMUNITY): Payer: Medicare Other | Attending: Hematology

## 2020-07-10 DIAGNOSIS — D500 Iron deficiency anemia secondary to blood loss (chronic): Secondary | ICD-10-CM

## 2020-07-10 DIAGNOSIS — K909 Intestinal malabsorption, unspecified: Secondary | ICD-10-CM | POA: Insufficient documentation

## 2020-07-10 DIAGNOSIS — D508 Other iron deficiency anemias: Secondary | ICD-10-CM | POA: Diagnosis not present

## 2020-07-10 LAB — COMPREHENSIVE METABOLIC PANEL
ALT: 27 U/L (ref 0–44)
AST: 30 U/L (ref 15–41)
Albumin: 2.9 g/dL — ABNORMAL LOW (ref 3.5–5.0)
Alkaline Phosphatase: 154 U/L — ABNORMAL HIGH (ref 38–126)
Anion gap: 7 (ref 5–15)
BUN: 21 mg/dL (ref 8–23)
CO2: 26 mmol/L (ref 22–32)
Calcium: 8.8 mg/dL — ABNORMAL LOW (ref 8.9–10.3)
Chloride: 104 mmol/L (ref 98–111)
Creatinine, Ser: 1.5 mg/dL — ABNORMAL HIGH (ref 0.61–1.24)
GFR, Estimated: 51 mL/min — ABNORMAL LOW (ref >60)
Glucose, Bld: 131 mg/dL — ABNORMAL HIGH (ref 70–99)
Potassium: 3.8 mmol/L (ref 3.5–5.1)
Sodium: 137 mmol/L (ref 135–145)
Total Bilirubin: 0.6 mg/dL (ref 0.3–1.2)
Total Protein: 7.5 g/dL (ref 6.5–8.1)

## 2020-07-10 LAB — CBC WITH DIFFERENTIAL/PLATELET
Abs Immature Granulocytes: 0.08 10*3/uL — ABNORMAL HIGH (ref 0.00–0.07)
Basophils Absolute: 0.1 10*3/uL (ref 0.0–0.1)
Basophils Relative: 1 %
Eosinophils Absolute: 0.1 10*3/uL (ref 0.0–0.5)
Eosinophils Relative: 1 %
HCT: 33.1 % — ABNORMAL LOW (ref 39.0–52.0)
Hemoglobin: 10.1 g/dL — ABNORMAL LOW (ref 13.0–17.0)
Immature Granulocytes: 1 %
Lymphocytes Relative: 12 %
Lymphs Abs: 1.6 10*3/uL (ref 0.7–4.0)
MCH: 26.6 pg (ref 26.0–34.0)
MCHC: 30.5 g/dL (ref 30.0–36.0)
MCV: 87.1 fL (ref 80.0–100.0)
Monocytes Absolute: 1 10*3/uL (ref 0.1–1.0)
Monocytes Relative: 8 %
Neutro Abs: 10.4 10*3/uL — ABNORMAL HIGH (ref 1.7–7.7)
Neutrophils Relative %: 77 %
Platelets: 501 10*3/uL — ABNORMAL HIGH (ref 150–400)
RBC: 3.8 MIL/uL — ABNORMAL LOW (ref 4.22–5.81)
RDW: 14.5 % (ref 11.5–15.5)
WBC: 13.3 10*3/uL — ABNORMAL HIGH (ref 4.0–10.5)
nRBC: 0 % (ref 0.0–0.2)

## 2020-07-10 LAB — LACTATE DEHYDROGENASE: LDH: 96 U/L — ABNORMAL LOW (ref 98–192)

## 2020-07-10 LAB — IRON AND TIBC
Iron: 16 ug/dL — ABNORMAL LOW (ref 45–182)
Saturation Ratios: 5 % — ABNORMAL LOW (ref 17.9–39.5)
TIBC: 330 ug/dL (ref 250–450)
UIBC: 314 ug/dL

## 2020-07-10 LAB — VITAMIN D 25 HYDROXY (VIT D DEFICIENCY, FRACTURES): Vit D, 25-Hydroxy: 22.19 ng/mL — ABNORMAL LOW (ref 30–100)

## 2020-07-10 LAB — FERRITIN: Ferritin: 29 ng/mL (ref 24–336)

## 2020-07-10 LAB — VITAMIN B12: Vitamin B-12: 478 pg/mL (ref 180–914)

## 2020-07-15 NOTE — Progress Notes (Signed)
Michael [name], Michael [name] [id number]   CLINIC:  Medical Oncology/Hematology  PCP:  [name], MD [address] [place] [address]  [phone]  REASON FOR VISIT:  Follow-up for IDA  PRIOR THERAPY: none  CURRENT THERAPY: Intermittent iron infusions  INTERVAL HISTORY:  Michael [name], a 65 y.o. male, returns for routine follow-up for his IDA. Michael [name] was last seen on 03/31/2020.  His last colonoscopy was on 05/31/2017   His energy levels are low. Michael [name] reports diarrhea once daily for the past 3 weeks, but denies black stools or issue with bleeding. Michael [name] is currently taking Bactrim which Michael [name] started 05/25 and will complete this evening. Michael [name] did not have diarrhea prior to taking the antibiotic. Michael [name] denies CP, but reports light-headedness and dizziness. Michael [name] reports that is energy levels do not increase following individual iron infusions. After his third infusion Michael [name] felt a slight increase in energy for 2 days. Michael [name] is not currently taking any blood thinner or vitamin D.   REVIEW OF SYSTEMS:  Review of Systems  Constitutional:  Positive for appetite change (50%) and fatigue (25%).  Cardiovascular:  Negative for chest pain.  Gastrointestinal:  Positive for diarrhea and nausea. Negative for blood in stool.  Musculoskeletal:  Positive for back pain (lower 4/10).  Neurological:  Positive for dizziness and light-headedness.  Hematological:  Does not bruise/bleed easily.  Psychiatric/Behavioral:  Positive for sleep disturbance.   All other systems reviewed and are negative.  PAST MEDICAL/SURGICAL HISTORY:  Past Medical History:  Diagnosis Date   Carotid artery disease (HCC)    Chronic diastolic heart failure (HCC)    CKD (chronic kidney disease) stage 3, GFR 30-59 ml/min (HCC)    COPD (chronic obstructive pulmonary disease) (HCC)    Coronary artery disease    Essential hypertension    Head and neck cancer ([place]) 2019   Right facial basal cell  carcinoma s/p resection with right partial mastectomy and partal rhinectomy with skin graft (06/13/17)   Iron deficiency anemia    Urinary retention    Past Surgical History:  Procedure Laterality Date   ANKLE CLOSED REDUCTION Right    open reduction   BASAL CELL CARCINOMA EXCISION  2019   [facility]   COLONOSCOPY N/A 05/31/2017   Procedure: COLONOSCOPY;  Surgeon: Daneil Dolin, MD;  [facility];  Service: Endoscopy;  Laterality: N/A;  2:45pm   CYSTOSCOPY N/A 10/29/2018   Procedure: CYSTOSCOPY, CLOT EVACUATION;  Surgeon: Ceasar Mons, MD;  [facility];  Service: Urology;  Laterality: N/A;   POLYPECTOMY  05/31/2017   Procedure: POLYPECTOMY;  Surgeon: Daneil Dolin, MD;  [facility];  Service: Endoscopy;;   TONSILLECTOMY     XI ROBOTIC ASSISTED SIMPLE PROSTATECTOMY N/A 10/29/2018   Procedure: XI ROBOTIC ASSISTED SIMPLE PROSTATECTOMY;  Surgeon: Cleon Gustin, MD;  [facility];  Service: Urology;  Laterality: N/A;    SOCIAL HISTORY:  Social History   Socioeconomic History   Marital status: Married    Spouse name: Not on file   Number of children: Not on file   Years of education: Not on file   Highest education level: Not on file  Occupational History   Not on file  Tobacco Use   Smoking status: Former    Packs/day: 0.50    Years: 40.00    Pack years: 20.00    Types: Cigarettes    Quit date: 12/01/2019  Years since quitting: 0.6   Smokeless tobacco: Never  Vaping Use   Vaping Use: Never used  Substance and Sexual Activity   Alcohol use: Not Currently   Drug use: Never   Sexual activity: Not Currently  Other Topics Concern   Not on file  Social History Narrative   Not on file   Social Determinants of Health   Financial Resource Strain: Low Risk    Difficulty of Paying Living Expenses: Not hard at all  Food Insecurity: No Food Insecurity   Worried About Charity fundraiser in the Last Year: Never true   [place] in the Last Year: Never true  Transportation Needs: No Transportation Needs   Lack of Transportation (Medical): No   Lack of Transportation (Non-Medical): No  Physical Activity: Inactive   Days of Exercise per Week: 0 days   Minutes of Exercise per Session: 0 min  Stress: No Stress Concern Present   Feeling of Stress : Not at all  Social Connections: Socially Isolated   Frequency of Communication with Friends and Family: Never   Frequency of Social Gatherings with Friends and Family: Never   Attends Religious Services: Never   Marine scientist or Organizations: No   Attends Music therapist: Never   Marital Status: Divorced  Human resources officer Violence: Not At Risk   Fear of Current or Ex-Partner: No   Emotionally Abused: No   Physically Abused: No   Sexually Abused: No    FAMILY HISTORY:  Family History  Problem Relation Age of Onset   Stroke Father    Cirrhosis Mother    Colon cancer Neg Hx     CURRENT MEDICATIONS:  Current Outpatient Medications  Medication Sig Dispense Refill   aspirin EC 81 MG tablet Take 81 mg by mouth daily.     atorvastatin (LIPITOR) 40 MG tablet Take 1 tablet (40 mg total) by mouth every evening. 90 tablet 3   ferrous sulfate 325 (65 FE) MG EC tablet Take 1 tablet (325 mg total) by mouth daily with breakfast. 30 tablet 3   isosorbide mononitrate (IMDUR) 30 MG 24 hr tablet Take 1 tablet (30 mg total) by mouth every evening. 90 tablet 3   lisinopril (ZESTRIL) 10 MG tablet Take 10 mg by mouth daily.     metoprolol succinate (TOPROL-XL) 25 MG 24 hr tablet Take 1 tablet (25 mg total) by mouth every evening. 90 tablet 3   nitroGLYCERIN (NITROSTAT) 0.4 MG SL tablet Place 1 tablet (0.4 mg total) under the tongue every 5 (five) minutes as needed for chest pain. 90 tablet 3   sulfamethoxazole-trimethoprim (BACTRIM DS) 800-160 MG tablet Take 1 tablet by mouth every 12 (twelve) hours. 14 tablet 0   temazepam (RESTORIL) 15 MG capsule Take 1  capsule (15 mg total) by mouth at bedtime as needed for sleep. 30 capsule 0   No current facility-administered medications for this visit.    ALLERGIES:  No Known Allergies  PHYSICAL EXAM:  Performance status (ECOG): 1 - Symptomatic but completely ambulatory  There were no vitals filed for this visit. Wt Readings from Last 3 Encounters:  07/07/20 170 lb (77.1 kg)  07/03/20 173 lb (78.5 kg)  06/17/20 181 lb (82.1 kg)   Physical Exam Vitals reviewed.  Constitutional:      Appearance: Normal appearance.  Cardiovascular:     Rate and Rhythm: Normal rate and regular rhythm.     Pulses: Normal pulses.  Heart sounds: Normal heart sounds.  Pulmonary:     Effort: Pulmonary effort is normal.     Breath sounds: Normal breath sounds.  Neurological:     General: No focal deficit present.     Mental Status: Michael [name] is alert and oriented to person, place, and time.  Psychiatric:        Mood and Affect: Mood normal.        Behavior: Behavior normal.    LABORATORY DATA:  I have reviewed the labs as listed.  CBC Latest Ref Rng & Units 07/10/2020 03/24/2020 12/04/2019  WBC 4.0 - 10.5 K/uL 13.3(H) 9.0 8.0  Hemoglobin 13.0 - 17.0 g/dL 10.1(L) 10.4(L) 11.3(L)  Hematocrit 39.0 - 52.0 % 33.1(L) 33.9(L) 37.9(L)  Platelets 150 - 400 K/uL 501(H) 295 347   CMP Latest Ref Rng & Units 07/10/2020 03/24/2020 12/04/2019  Glucose 70 - 99 mg/dL 131(H) 99 176(H)  BUN 8 - 23 mg/dL 21 33(H) 27(H)  Creatinine 0.61 - 1.24 mg/dL 1.50(H) 1.76(H) 2.30(H)  Sodium 135 - 145 mmol/L 137 140 137  Potassium 3.5 - 5.1 mmol/L 3.8 4.2 3.6  Chloride 98 - 111 mmol/L 104 106 98  CO2 22 - 32 mmol/L 26 26 28   Calcium 8.9 - 10.3 mg/dL 8.8(L) 9.2 9.1  Total Protein 6.5 - 8.1 g/dL 7.5 7.1 7.5  Total Bilirubin 0.3 - 1.2 mg/dL 0.6 0.5 0.6  Alkaline Phos 38 - 126 U/L 154(H) 150(H) 187(H)  AST 15 - 41 U/L 30 23 45(H)  ALT 0 - 44 U/L 27 23 39      Component Value Date/Time   RBC 3.80 (L) 07/10/2020 0825   MCV 87.1 07/10/2020  0825   MCV 84 04/23/2011 0919   MCH 26.6 07/10/2020 0825   MCHC 30.5 07/10/2020 0825   RDW 14.5 07/10/2020 0825   RDW 12.3 04/23/2011 0919   LYMPHSABS 1.6 07/10/2020 0825   LYMPHSABS 1.1 04/23/2011 0919   MONOABS 1.0 07/10/2020 0825   MONOABS 0.9 (H) 04/23/2011 0919   EOSABS 0.1 07/10/2020 0825   EOSABS 0.0 04/23/2011 0919   BASOSABS 0.1 07/10/2020 0825   BASOSABS 0.0 04/23/2011 0919    DIAGNOSTIC IMAGING:  I have independently reviewed the scans and discussed with the patient. DG Shoulder Left  Result Date: 07/12/2020 Formatting of this result is different from the original. X-rays of the left shoulder were obtained in clinic today and demonstrates no acute abnormality.  There is some soft tissue swelling over the lateral shoulder area.  No glenohumeral arthritis.  There is no calcification within the soft tissue shadowing over the lateral shoulder.   Impression: Normal right shoulder x-ray, with lateral soft tissue swelling.    ASSESSMENT:  1.  Iron deficiency anemia: - Combination anemia from CKD, malabsorption and likely blood loss. - Michael [name] has been on intermittent Feraheme infusions. - Colonoscopy on 05/31/2017 with 6 subcentimeter polyps removed from the descending colon and one 8 mm polyp in the descending colon.  Multiple large and small polyps located from the area of the hepatic flexure to the cecum along with a polypoid mass which was biopsied.  Pathology consistent with tubular adenomas and hyperplastic polyps.  2.  Locally advanced basal cell carcinoma right nasolabial fold: - Biopsy of the right nasal mucosa on 04/05/2017 with basal cell carcinoma. - Biopsy of the left cheek lesion on 04/02/2017 consistent with basal cell carcinoma. - Resection with skin graft on 06/06/2017.  Margin was close.  Rest of the margins were negative. - 10 rounds  of radiation by Dr. Isidore Moos for recurrent basal cell carcinoma to the right medial canthus completed on 03/20/2020.   PLAN:  1.  Iron  deficiency anemia: - Michael [name] does not report any bleeding per rectum or melena.  Michael [name] reports some diarrhea since Michael [name] was started on Bactrim about 2 weeks ago for Klebsiella pneumonia in the urine.  Michael [name] has 1 loose bowel movement per day. - Reviewed labs from 07/10/2020.  Ferritin is 29.  Vitamin B12 is normal.  Hemoglobin is 10.1.  Creatinine is 1.5. - Michael [name] received 3 infusions of Feraheme from 04/03/2020 through 04/07/2020. - I have recommended stool for occult blood testing. - Recommend Feraheme weekly x3. - Recommend follow-up in 3 months with repeat labs.  2.  Low vitamin D levels: - Vitamin D level was 22.  Recommend starting vitamin D 1000 units daily.   Orders placed this encounter:  No orders of the defined types were placed in this encounter.    Derek Jack, MD [place] [phone]   I, Thana Ates, am acting as a scribe for Dr. Derek Jack.  I, Derek Jack MD, have reviewed the above documentation for accuracy and completeness, and I agree with the above.

## 2020-07-16 ENCOUNTER — Other Ambulatory Visit: Payer: Self-pay

## 2020-07-16 ENCOUNTER — Inpatient Hospital Stay (HOSPITAL_BASED_OUTPATIENT_CLINIC_OR_DEPARTMENT_OTHER): Payer: Medicare Other | Admitting: Hematology

## 2020-07-16 VITALS — BP 129/69 | HR 75 | Temp 96.9°F | Resp 20 | Wt 170.8 lb

## 2020-07-16 DIAGNOSIS — C44311 Basal cell carcinoma of skin of nose: Secondary | ICD-10-CM

## 2020-07-16 DIAGNOSIS — K909 Intestinal malabsorption, unspecified: Secondary | ICD-10-CM | POA: Diagnosis not present

## 2020-07-16 DIAGNOSIS — D500 Iron deficiency anemia secondary to blood loss (chronic): Secondary | ICD-10-CM

## 2020-07-16 DIAGNOSIS — D508 Other iron deficiency anemias: Secondary | ICD-10-CM | POA: Diagnosis not present

## 2020-07-16 NOTE — Patient Instructions (Addendum)
[place] at [facility] Discharge Instructions  You were seen today by Dr. Delton Coombes. He went over your recent results. You will be scheduled to receive 3 iron infusions. Purchase vitamin D tablets over the counter and take 1000 units daily. Dr. Delton Coombes will see you back in 3 months for labs and follow up.   Thank you for choosing [place] at [facility] to provide your oncology and hematology care.  To afford each patient quality time with our provider, please arrive at least 15 minutes before your scheduled appointment time.   If you have a lab appointment with the [facility] please come in thru the Main Entrance and check in at the main information desk  You need to re-schedule your appointment should you arrive 10 or more minutes late.  We strive to give you quality time with our providers, and arriving late affects you and other patients whose appointments are after yours.  Also, if you no show three or more times for appointments you may be dismissed from the clinic at the providers discretion.     Again, thank you for choosing [facility].  Our hope is that these requests will decrease the amount of time that you wait before being seen by our physicians.       _____________________________________________________________  Should you have questions after your visit to [facility], please contact our office at [phone] between the hours of 8:00 a.m. and 4:30 p.m.  Voicemails left after 4:00 p.m. will not be returned until the following business day.  For prescription refill requests, have your pharmacy contact our office and allow 72 hours.    Cancer Center Support Programs:   > Cancer Support Group  2nd Tuesday of the month 1pm-2pm, Journey Room

## 2020-07-17 ENCOUNTER — Inpatient Hospital Stay (HOSPITAL_COMMUNITY): Payer: Medicare Other

## 2020-07-17 VITALS — BP 168/82 | HR 78 | Temp 97.2°F | Resp 18

## 2020-07-17 DIAGNOSIS — K909 Intestinal malabsorption, unspecified: Secondary | ICD-10-CM | POA: Diagnosis not present

## 2020-07-17 DIAGNOSIS — D508 Other iron deficiency anemias: Secondary | ICD-10-CM | POA: Diagnosis not present

## 2020-07-17 DIAGNOSIS — D500 Iron deficiency anemia secondary to blood loss (chronic): Secondary | ICD-10-CM

## 2020-07-17 MED ORDER — SODIUM CHLORIDE 0.9 % IV SOLN: Freq: Once | INTRAVENOUS | Status: AC

## 2020-07-17 MED ORDER — SODIUM CHLORIDE 0.9 % IV SOLN
510.0000 mg | Freq: Once | INTRAVENOUS | Status: AC
  Administered 2020-07-17: 510 mg via INTRAVENOUS
  Filled 2020-07-17: qty 510

## 2020-07-17 MED ORDER — ACETAMINOPHEN 325 MG PO TABS
ORAL_TABLET | ORAL | Status: AC
  Filled 2020-07-17: qty 2

## 2020-07-17 MED ORDER — ACETAMINOPHEN 325 MG PO TABS
650.0000 mg | ORAL_TABLET | Freq: Once | ORAL | Status: AC
  Administered 2020-07-17: 650 mg via ORAL

## 2020-07-17 NOTE — Progress Notes (Signed)
Patient presents today for Feraheme infusion per providers order.  Vital signs WNL.  Patient has no new complaints since last visit.  Peripheral IV started and blood return noted pre and post infusion.  Patient complaining of chronic lower back pain and requesting tylenol.  R.Pennington PA verbal order for 650 mg tylenol PO once.  Feraheme infusion given today per MD orders.  Stable during infusion without adverse affects.  Vital signs stable.  Patient declines to wait the post infusion wait time stating that he never waits after the infusion before leaving.  No complaints at this time.  Discharge from clinic via wheelchair in stable condition.  Alert and oriented X 3.  Follow up with [facility] as scheduled.

## 2020-07-17 NOTE — Patient Instructions (Signed)
[facility]  Discharge Instructions: Thank you for choosing [facility] to provide your oncology and hematology care.  If you have a lab appointment with the [facility], please come in thru the Main Entrance and check in at the main information desk.  Wear comfortable clothing and clothing appropriate for easy access to any Portacath or PICC line.   We strive to give you quality time with your provider. You may need to reschedule your appointment if you arrive late (15 or more minutes).  Arriving late affects you and other patients whose appointments are after yours.  Also, if you miss three or more appointments without notifying the office, you may be dismissed from the clinic at the provider's discretion.      For prescription refill requests, have your pharmacy contact our office and allow 72 hours for refills to be completed.    Today you received the following Feraheme infusion      To help prevent nausea and vomiting after your treatment, we encourage you to take your nausea medication as directed.  BELOW ARE SYMPTOMS THAT SHOULD BE REPORTED IMMEDIATELY: *FEVER GREATER THAN 100.4 F (38 C) OR HIGHER *CHILLS OR SWEATING *NAUSEA AND VOMITING THAT IS NOT CONTROLLED WITH YOUR NAUSEA MEDICATION *UNUSUAL SHORTNESS OF BREATH *UNUSUAL BRUISING OR BLEEDING *URINARY PROBLEMS (pain or burning when urinating, or frequent urination) *BOWEL PROBLEMS (unusual diarrhea, constipation, pain near the anus) TENDERNESS IN MOUTH AND THROAT WITH OR WITHOUT PRESENCE OF ULCERS (sore throat, sores in mouth, or a toothache) UNUSUAL RASH, SWELLING OR PAIN  UNUSUAL VAGINAL DISCHARGE OR ITCHING   Items with * indicate a potential emergency and should be followed up as soon as possible or go to the Emergency Department if any problems should occur.  Please show the CHEMOTHERAPY ALERT CARD or IMMUNOTHERAPY ALERT CARD at check-in to the Emergency Department and triage nurse.  Should  you have questions after your visit or need to cancel or reschedule your appointment, please contact [facility] [phone]  and follow the prompts.  Office hours are 8:00 a.m. to 4:30 p.m. Monday - Friday. Please note that voicemails left after 4:00 p.m. may not be returned until the following business day.  We are closed weekends and major holidays. You have access to a nurse at all times for urgent questions. Please call the main number to the clinic [phone] and follow the prompts.  For any non-urgent questions, you may also contact your provider using MyChart. We now offer e-Visits for anyone 76 and older to request care online for non-urgent symptoms. For details visit [email or web address].   Also download the MyChart app! Go to the app store, search "MyChart", open the app, select [facility], and log in with your MyChart username and password.  Due to Covid, a mask is required upon entering the hospital/clinic. If you do not have a mask, one will be given to you upon arrival. For doctor visits, patients may have 1 support person aged 62 or older with them. For treatment visits, patients cannot have anyone with them due to current Covid guidelines and our immunocompromised population.

## 2020-07-24 ENCOUNTER — Other Ambulatory Visit (HOSPITAL_COMMUNITY): Payer: Self-pay

## 2020-07-24 ENCOUNTER — Other Ambulatory Visit: Payer: Self-pay

## 2020-07-24 ENCOUNTER — Inpatient Hospital Stay (HOSPITAL_COMMUNITY): Payer: Medicare Other

## 2020-07-24 VITALS — BP 176/92 | HR 89 | Temp 97.0°F | Resp 20

## 2020-07-24 DIAGNOSIS — D500 Iron deficiency anemia secondary to blood loss (chronic): Secondary | ICD-10-CM

## 2020-07-24 DIAGNOSIS — D508 Other iron deficiency anemias: Secondary | ICD-10-CM | POA: Diagnosis not present

## 2020-07-24 DIAGNOSIS — K909 Intestinal malabsorption, unspecified: Secondary | ICD-10-CM | POA: Diagnosis not present

## 2020-07-24 LAB — OCCULT BLOOD X 1 CARD TO LAB, STOOL
Fecal Occult Bld: POSITIVE — AB
Fecal Occult Bld: POSITIVE — AB
Fecal Occult Bld: POSITIVE — AB

## 2020-07-24 MED ORDER — FERUMOXYTOL INJECTION 510 MG/17 ML
510.0000 mg | Freq: Once | INTRAVENOUS | Status: AC
  Administered 2020-07-24: 510 mg via INTRAVENOUS
  Filled 2020-07-24: qty 510

## 2020-07-24 MED ORDER — SODIUM CHLORIDE 0.9 % IV SOLN: Freq: Once | INTRAVENOUS | Status: AC

## 2020-07-24 NOTE — Patient Instructions (Signed)
[facility]  Discharge Instructions: Thank you for choosing [facility] to provide your oncology and hematology care.  If you have a lab appointment with the Cancer Center, please come in thru the Main Entrance and check in at the main information desk.  Wear comfortable clothing and clothing appropriate for easy access to any Portacath or PICC line.   We strive to give you quality time with your provider. You may need to reschedule your appointment if you arrive late (15 or more minutes).  Arriving late affects you and other patients whose appointments are after yours.  Also, if you miss three or more appointments without notifying the office, you may be dismissed from the clinic at the provider's discretion.      For prescription refill requests, have your pharmacy contact our office and allow 72 hours for refills to be completed.    Today you received the following chemotherapy and/or immunotherapy agents Feraheme infusion      To help prevent nausea and vomiting after your treatment, we encourage you to take your nausea medication as directed.  BELOW ARE SYMPTOMS THAT SHOULD BE REPORTED IMMEDIATELY: *FEVER GREATER THAN 100.4 F (38 C) OR HIGHER *CHILLS OR SWEATING *NAUSEA AND VOMITING THAT IS NOT CONTROLLED WITH YOUR NAUSEA MEDICATION *UNUSUAL SHORTNESS OF BREATH *UNUSUAL BRUISING OR BLEEDING *URINARY PROBLEMS (pain or burning when urinating, or frequent urination) *BOWEL PROBLEMS (unusual diarrhea, constipation, pain near the anus) TENDERNESS IN MOUTH AND THROAT WITH OR WITHOUT PRESENCE OF ULCERS (sore throat, sores in mouth, or a toothache) UNUSUAL RASH, SWELLING OR PAIN  UNUSUAL VAGINAL DISCHARGE OR ITCHING   Items with * indicate a potential emergency and should be followed up as soon as possible or go to the Emergency Department if any problems should occur.  Please show the CHEMOTHERAPY ALERT CARD or IMMUNOTHERAPY ALERT CARD at check-in to the  Emergency Department and triage nurse.  Should you have questions after your visit or need to cancel or reschedule your appointment, please contact [facility] [phone]  and follow the prompts.  Office hours are 8:00 a.m. to 4:30 p.m. Monday - Friday. Please note that voicemails left after 4:00 p.m. may not be returned until the following business day.  We are closed weekends and major holidays. You have access to a nurse at all times for urgent questions. Please call the main number to the clinic [phone] and follow the prompts.  For any non-urgent questions, you may also contact your provider using MyChart. We now offer e-Visits for anyone 18 and older to request care online for non-urgent symptoms. For details visit [email or web address].   Also download the MyChart app! Go to the app store, search "MyChart", open the app, select [facility], and log in with your MyChart username and password.  Due to Covid, a mask is required upon entering the hospital/clinic. If you do not have a mask, one will be given to you upon arrival. For doctor visits, patients may have 1 support person aged 18 or older with them. For treatment visits, patients cannot have anyone with them due to current Covid guidelines and our immunocompromised population.  

## 2020-07-24 NOTE — Progress Notes (Signed)
Patient presents today for Feraheme infusion per providers orders.  Vital signs reviewed and BP noted to be elevated.  Manual BP taken and was 176/92.  Patient has no new complaints today except fatigue.  Peripheral IV started and blood return noted pre and post infusion.  Feraheme infusion given today per MD orders.  Stable during infusion without adverse affects.  Vital signs stable.  No complaints at this time.  Discharge from clinic ambulatory in stable condition.  Alert and oriented X 3.  Follow up with [facility] as scheduled.

## 2020-07-31 ENCOUNTER — Inpatient Hospital Stay (HOSPITAL_COMMUNITY): Payer: Medicare Other | Attending: Hematology

## 2020-07-31 ENCOUNTER — Encounter (HOSPITAL_COMMUNITY): Payer: Self-pay

## 2020-07-31 ENCOUNTER — Other Ambulatory Visit: Payer: Self-pay

## 2020-07-31 VITALS — BP 154/95 | HR 93 | Temp 98.5°F | Resp 18

## 2020-07-31 DIAGNOSIS — K909 Intestinal malabsorption, unspecified: Secondary | ICD-10-CM | POA: Insufficient documentation

## 2020-07-31 DIAGNOSIS — D500 Iron deficiency anemia secondary to blood loss (chronic): Secondary | ICD-10-CM

## 2020-07-31 DIAGNOSIS — D508 Other iron deficiency anemias: Secondary | ICD-10-CM | POA: Insufficient documentation

## 2020-07-31 MED ORDER — SODIUM CHLORIDE 0.9 % IV SOLN: Freq: Once | INTRAVENOUS | Status: AC

## 2020-07-31 MED ORDER — SODIUM CHLORIDE 0.9 % IV SOLN
510.0000 mg | Freq: Once | INTRAVENOUS | Status: AC
  Administered 2020-07-31: 510 mg via INTRAVENOUS
  Filled 2020-07-31: qty 510

## 2020-07-31 NOTE — Patient Instructions (Signed)
[facility]  Discharge Instructions: ?Thank you for choosing [facility] to provide your oncology and hematology care.  ?If you have a lab appointment with the Cancer Center, please come in thru the Main Entrance and check in at the main information desk. ? ?Wear comfortable clothing and clothing appropriate for easy access to any Portacath or PICC line.  ? ?We strive to give you quality time with your provider. You may need to reschedule your appointment if you arrive late (15 or more minutes).  Arriving late affects you and other patients whose appointments are after yours.  Also, if you miss three or more appointments without notifying the office, you may be dismissed from the clinic at the provider?s discretion.    ?  ?For prescription refill requests, have your pharmacy contact our office and allow 72 hours for refills to be completed.   ? ?Today you received the following Feraheme, return as scheduled. ?  ?To help prevent nausea and vomiting after your treatment, we encourage you to take your nausea medication as directed. ? ?BELOW ARE SYMPTOMS THAT SHOULD BE REPORTED IMMEDIATELY: ?*FEVER GREATER THAN 100.4 F (38 ?C) OR HIGHER ?*CHILLS OR SWEATING ?*NAUSEA AND VOMITING THAT IS NOT CONTROLLED WITH YOUR NAUSEA MEDICATION ?*UNUSUAL SHORTNESS OF BREATH ?*UNUSUAL BRUISING OR BLEEDING ?*URINARY PROBLEMS (pain or burning when urinating, or frequent urination) ?*BOWEL PROBLEMS (unusual diarrhea, constipation, pain near the anus) ?TENDERNESS IN MOUTH AND THROAT WITH OR WITHOUT PRESENCE OF ULCERS (sore throat, sores in mouth, or a toothache) ?UNUSUAL RASH, SWELLING OR PAIN  ?UNUSUAL VAGINAL DISCHARGE OR ITCHING  ? ?Items with * indicate a potential emergency and should be followed up as soon as possible or go to the Emergency Department if any problems should occur. ? ?Please show the CHEMOTHERAPY ALERT CARD or IMMUNOTHERAPY ALERT CARD at check-in to the Emergency Department and triage  nurse. ? ?Should you have questions after your visit or need to cancel or reschedule your appointment, please contact [facility] [phone]  and follow the prompts.  Office hours are 8:00 a.m. to 4:30 p.m. Monday - Friday. Please note that voicemails left after 4:00 p.m. may not be returned until the following business day.  We are closed weekends and major holidays. You have access to a nurse at all times for urgent questions. Please call the main number to the clinic [phone] and follow the prompts. ? ?For any non-urgent questions, you may also contact your provider using MyChart. We now offer e-Visits for anyone 18 and older to request care online for non-urgent symptoms. For details visit [email or web address]. ?  ?Also download the MyChart app! Go to the app store, search "MyChart", open the app, select [facility], and log in with your MyChart username and password. ? ?Due to Covid, a mask is required upon entering the hospital/clinic. If you do not have a mask, one will be given to you upon arrival. For doctor visits, patients may have 1 support person aged 18 or older with them. For treatment visits, patients cannot have anyone with them due to current Covid guidelines and our immunocompromised population.  ?

## 2020-07-31 NOTE — Progress Notes (Signed)
Patient tolerated iron infusion with no complaints voiced.  Peripheral IV site clean and dry with good blood return noted before and after infusion.  Band aid applied.  VSS with discharge and left in satisfactory condition with no s/s of distress noted.   

## 2020-10-05 ENCOUNTER — Encounter (HOSPITAL_COMMUNITY): Payer: Self-pay | Admitting: Hematology

## 2020-10-12 ENCOUNTER — Other Ambulatory Visit: Payer: Self-pay

## 2020-10-12 ENCOUNTER — Inpatient Hospital Stay (HOSPITAL_COMMUNITY): Payer: Medicare HMO | Attending: Hematology

## 2020-10-12 DIAGNOSIS — Z79899 Other long term (current) drug therapy: Secondary | ICD-10-CM | POA: Diagnosis not present

## 2020-10-12 DIAGNOSIS — D500 Iron deficiency anemia secondary to blood loss (chronic): Secondary | ICD-10-CM

## 2020-10-12 DIAGNOSIS — D508 Other iron deficiency anemias: Secondary | ICD-10-CM | POA: Diagnosis not present

## 2020-10-12 DIAGNOSIS — Z85828 Personal history of other malignant neoplasm of skin: Secondary | ICD-10-CM | POA: Diagnosis not present

## 2020-10-12 DIAGNOSIS — E559 Vitamin D deficiency, unspecified: Secondary | ICD-10-CM | POA: Insufficient documentation

## 2020-10-12 DIAGNOSIS — K909 Intestinal malabsorption, unspecified: Secondary | ICD-10-CM | POA: Diagnosis not present

## 2020-10-12 DIAGNOSIS — C44311 Basal cell carcinoma of skin of nose: Secondary | ICD-10-CM

## 2020-10-12 LAB — CBC WITH DIFFERENTIAL/PLATELET
Abs Immature Granulocytes: 0.06 10*3/uL (ref 0.00–0.07)
Basophils Absolute: 0.1 10*3/uL (ref 0.0–0.1)
Basophils Relative: 1 %
Eosinophils Absolute: 0.1 10*3/uL (ref 0.0–0.5)
Eosinophils Relative: 1 %
HCT: 38.8 % — ABNORMAL LOW (ref 39.0–52.0)
Hemoglobin: 12.1 g/dL — ABNORMAL LOW (ref 13.0–17.0)
Immature Granulocytes: 1 %
Lymphocytes Relative: 20 %
Lymphs Abs: 2.2 10*3/uL (ref 0.7–4.0)
MCH: 26.8 pg (ref 26.0–34.0)
MCHC: 31.2 g/dL (ref 30.0–36.0)
MCV: 86 fL (ref 80.0–100.0)
Monocytes Absolute: 1.1 10*3/uL — ABNORMAL HIGH (ref 0.1–1.0)
Monocytes Relative: 10 %
Neutro Abs: 7.2 10*3/uL (ref 1.7–7.7)
Neutrophils Relative %: 67 %
Platelets: 422 10*3/uL — ABNORMAL HIGH (ref 150–400)
RBC: 4.51 MIL/uL (ref 4.22–5.81)
RDW: 13.7 % (ref 11.5–15.5)
WBC: 10.8 10*3/uL — ABNORMAL HIGH (ref 4.0–10.5)
nRBC: 0 % (ref 0.0–0.2)

## 2020-10-12 LAB — IRON AND TIBC
Iron: 17 ug/dL — ABNORMAL LOW (ref 45–182)
Saturation Ratios: 6 % — ABNORMAL LOW (ref 17.9–39.5)
TIBC: 271 ug/dL (ref 250–450)
UIBC: 254 ug/dL

## 2020-10-12 LAB — COMPREHENSIVE METABOLIC PANEL
ALT: 14 U/L (ref 0–44)
AST: 18 U/L (ref 15–41)
Albumin: 2.9 g/dL — ABNORMAL LOW (ref 3.5–5.0)
Alkaline Phosphatase: 127 U/L — ABNORMAL HIGH (ref 38–126)
Anion gap: 7 (ref 5–15)
BUN: 15 mg/dL (ref 8–23)
CO2: 26 mmol/L (ref 22–32)
Calcium: 8.8 mg/dL — ABNORMAL LOW (ref 8.9–10.3)
Chloride: 101 mmol/L (ref 98–111)
Creatinine, Ser: 1.4 mg/dL — ABNORMAL HIGH (ref 0.61–1.24)
GFR, Estimated: 55 mL/min — ABNORMAL LOW (ref >60)
Glucose, Bld: 138 mg/dL — ABNORMAL HIGH (ref 70–99)
Potassium: 3.8 mmol/L (ref 3.5–5.1)
Sodium: 134 mmol/L — ABNORMAL LOW (ref 135–145)
Total Bilirubin: 0.2 mg/dL — ABNORMAL LOW (ref 0.3–1.2)
Total Protein: 7.2 g/dL (ref 6.5–8.1)

## 2020-10-12 LAB — VITAMIN B12: Vitamin B-12: 289 pg/mL (ref 180–914)

## 2020-10-12 LAB — MAGNESIUM: Magnesium: 2 mg/dL (ref 1.7–2.4)

## 2020-10-12 LAB — VITAMIN D 25 HYDROXY (VIT D DEFICIENCY, FRACTURES): Vit D, 25-Hydroxy: 18.3 ng/mL — ABNORMAL LOW (ref 30–100)

## 2020-10-12 LAB — LACTATE DEHYDROGENASE: LDH: 106 U/L (ref 98–192)

## 2020-10-18 NOTE — Progress Notes (Signed)
[facility], [place] [address]   CLINIC:  Medical Oncology/Hematology  PCP:  [name], MD [place] [address] [phone]   REASON FOR VISIT:  Follow-up for iron deficiency anemia  PRIOR THERAPY: None  CURRENT THERAPY: Intermittent IV iron infusions (last given Feraheme x 3 from 07/1720 through 07/31/2020)  INTERVAL HISTORY:  Michael [name] 65 y.o. male returns for routine follow-up of his iron deficiency anemia.  He was last seen by Dr. Delton Coombes on 07/16/2020.  At today's visit, he reports feeling fair.  No recent hospitalizations, surgeries, or changes in baseline health status.  Patient reports he felt slightly improved energy a few weeks after his last IV iron infusion.  He denies any blood in his bowel movements, hematemesis, or [employer] taxis.  However, he does state that he has black bowel movements a few times per week, but states that "I guess that is normal."  He denies any chest pain, syncope, dizziness, or dyspnea.  He has 75% energy and 100% appetite. He endorses that he is maintaining a stable weight.    REVIEW OF SYSTEMS:  Review of Systems  Constitutional:  Positive for fatigue. Negative for appetite change, chills, diaphoresis, fever and unexpected weight change.  HENT:   Negative for lump/mass and nosebleeds.   Eyes:  Negative for eye problems.  Respiratory:  Negative for cough, hemoptysis and shortness of breath.   Cardiovascular:  Negative for chest pain, leg swelling and palpitations.  Gastrointestinal:  Negative for abdominal pain, blood in stool, constipation, diarrhea, nausea and vomiting.  Genitourinary:  Negative for hematuria.   Skin: Negative.   Neurological:  Negative for dizziness, headaches and light-headedness.  Hematological:  Does not bruise/bleed easily.     PAST MEDICAL/SURGICAL HISTORY:  Past Medical History:  Diagnosis Date   Carotid artery disease (HCC)    Chronic diastolic  heart failure (HCC)    CKD (chronic kidney disease) stage 3, GFR 30-59 ml/min (HCC)    COPD (chronic obstructive pulmonary disease) (HCC)    Coronary artery disease    Essential hypertension    Head and neck cancer ([place]) 2019   Right facial basal cell carcinoma s/p resection with right partial mastectomy and partal rhinectomy with skin graft (06/13/17)   Iron deficiency anemia    Urinary retention    Past Surgical History:  Procedure Laterality Date   ANKLE CLOSED REDUCTION Right    open reduction   BASAL CELL CARCINOMA EXCISION  2019   at [facility]   COLONOSCOPY N/A 05/31/2017   Procedure: COLONOSCOPY;  Surgeon: Daneil Dolin, MD;  Location: [facility];  Service: Endoscopy;  Laterality: N/A;  2:45pm   CYSTOSCOPY N/A 10/29/2018   Procedure: CYSTOSCOPY, CLOT EVACUATION;  Surgeon: Ceasar Mons, MD;  Location: [facility];  Service: Urology;  Laterality: N/A;   POLYPECTOMY  05/31/2017   Procedure: POLYPECTOMY;  Surgeon: Daneil Dolin, MD;  Location: [facility];  Service: Endoscopy;;   TONSILLECTOMY     XI ROBOTIC ASSISTED SIMPLE PROSTATECTOMY N/A 10/29/2018   Procedure: XI ROBOTIC ASSISTED SIMPLE PROSTATECTOMY;  Surgeon: Cleon Gustin, MD;  Location: [facility];  Service: Urology;  Laterality: N/A;     SOCIAL HISTORY:  Social History   Socioeconomic History   Marital status: Married    Spouse name: Not on file   Number of children: Not on file   Years of education: Not on file   Highest education level: Not  on file  Occupational History   Not on file  Tobacco Use   Smoking status: Former    Packs/day: 0.50    Years: 40.00    Pack years: 20.00    Types: Cigarettes    Quit date: 12/01/2019    Years since quitting: 0.8   Smokeless tobacco: Never  Vaping Use   Vaping Use: Never used  Substance and Sexual Activity   Alcohol use: Not Currently   Drug use: Never   Sexual activity: Not Currently  Other Topics Concern   Not on file  Social History Narrative   Not  on file   Social Determinants of Health   Financial Resource Strain: Low Risk    Difficulty of Paying Living Expenses: Not hard at all  Food Insecurity: No Food Insecurity   Worried About Charity fundraiser in the Last Year: Never true   [place] in the Last Year: Never true  Transportation Needs: No Transportation Needs   Lack of Transportation (Medical): No   Lack of Transportation (Non-Medical): No  Physical Activity: Inactive   Days of Exercise per Week: 0 days   Minutes of Exercise per Session: 0 min  Stress: No Stress Concern Present   Feeling of Stress : Not at all  Social Connections: Socially Isolated   Frequency of Communication with Friends and Family: Never   Frequency of Social Gatherings with Friends and Family: Never   Attends Religious Services: Never   Marine scientist or Organizations: No   Attends Music therapist: Never   Marital Status: Divorced  Human resources officer Violence: Not At Risk   Fear of Current or Ex-Partner: No   Emotionally Abused: No   Physically Abused: No   Sexually Abused: No    FAMILY HISTORY:  Family History  Problem Relation Age of Onset   Stroke Father    Cirrhosis Mother    Colon cancer Neg Hx     CURRENT MEDICATIONS:  Outpatient Encounter Medications as of 10/19/2020  Medication Sig   aspirin EC 81 MG tablet Take 81 mg by mouth daily.   atorvastatin (LIPITOR) 40 MG tablet Take 1 tablet (40 mg total) by mouth every evening.   ferrous sulfate 325 (65 FE) MG EC tablet Take 1 tablet (325 mg total) by mouth daily with breakfast.   isosorbide mononitrate (IMDUR) 30 MG 24 hr tablet Take 1 tablet (30 mg total) by mouth every evening.   lisinopril (ZESTRIL) 10 MG tablet Take 10 mg by mouth daily.   metoprolol succinate (TOPROL-XL) 25 MG 24 hr tablet Take 1 tablet (25 mg total) by mouth every evening.   nitroGLYCERIN (NITROSTAT) 0.4 MG SL tablet Place 1 tablet (0.4 mg total) under the tongue every 5 (five)  minutes as needed for chest pain.   sulfamethoxazole-trimethoprim (BACTRIM DS) 800-160 MG tablet Take 1 tablet by mouth every 12 (twelve) hours.   temazepam (RESTORIL) 15 MG capsule Take 1 capsule (15 mg total) by mouth at bedtime as needed for sleep.   Vitamin D, Cholecalciferol, 25 MCG (1000 UT) CAPS Take 1 capsule by mouth daily.   No facility-administered encounter medications on file as of 10/19/2020.    ALLERGIES:  No Known Allergies   PHYSICAL EXAM:  ECOG PERFORMANCE STATUS: 1 - Symptomatic but completely ambulatory  There were no vitals filed for this visit. There were no vitals filed for this visit. Physical Exam Constitutional:      Appearance: Normal appearance.  HENT:  Head: Normocephalic and atraumatic.     Comments: Nasal deformity (right side) following resection of skin cancer    Mouth/Throat:     Mouth: Mucous membranes are moist.  Eyes:     Extraocular Movements: Extraocular movements intact.     Pupils: Pupils are equal, round, and reactive to light.  Cardiovascular:     Rate and Rhythm: Normal rate and regular rhythm.     Pulses: Normal pulses.     Heart sounds: Normal heart sounds.  Pulmonary:     Effort: Pulmonary effort is normal.     Breath sounds: Normal breath sounds. Decreased air movement present.  Abdominal:     General: Bowel sounds are normal.     Palpations: Abdomen is soft.     Tenderness: There is no abdominal tenderness.  Musculoskeletal:        General: No swelling.     Right lower leg: No edema.     Left lower leg: No edema.  Lymphadenopathy:     Cervical: No cervical adenopathy.  Skin:    General: Skin is warm and dry.  Neurological:     General: No focal deficit present.     Mental Status: He is alert and oriented to person, place, and time.  Psychiatric:        Mood and Affect: Mood normal.        Behavior: Behavior normal.     LABORATORY DATA:  I have reviewed the labs as listed.  CBC    Component Value Date/Time    WBC 10.8 (H) 10/12/2020 1132   RBC 4.51 10/12/2020 1132   HGB 12.1 (L) 10/12/2020 1132   HGB 13.8 04/23/2011 0919   HCT 38.8 (L) 10/12/2020 1132   HCT 26.0 (L) 05/09/2018 0455   PLT 422 (H) 10/12/2020 1132   PLT 208 04/23/2011 0919   MCV 86.0 10/12/2020 1132   MCV 84 04/23/2011 0919   MCH 26.8 10/12/2020 1132   MCHC 31.2 10/12/2020 1132   RDW 13.7 10/12/2020 1132   RDW 12.3 04/23/2011 0919   LYMPHSABS 2.2 10/12/2020 1132   LYMPHSABS 1.1 04/23/2011 0919   MONOABS 1.1 (H) 10/12/2020 1132   MONOABS 0.9 (H) 04/23/2011 0919   EOSABS 0.1 10/12/2020 1132   EOSABS 0.0 04/23/2011 0919   BASOSABS 0.1 10/12/2020 1132   BASOSABS 0.0 04/23/2011 0919   CMP Latest Ref Rng & Units 10/12/2020 07/10/2020 03/24/2020  Glucose 70 - 99 mg/dL 138(H) 131(H) 99  BUN 8 - 23 mg/dL 15 21 33(H)  Creatinine 0.61 - 1.24 mg/dL 1.40(H) 1.50(H) 1.76(H)  Sodium 135 - 145 mmol/L 134(L) 137 140  Potassium 3.5 - 5.1 mmol/L 3.8 3.8 4.2  Chloride 98 - 111 mmol/L 101 104 106  CO2 22 - 32 mmol/L '26 26 26  '$ Calcium 8.9 - 10.3 mg/dL 8.8(L) 8.8(L) 9.2  Total Protein 6.5 - 8.1 g/dL 7.2 7.5 7.1  Total Bilirubin 0.3 - 1.2 mg/dL 0.2(L) 0.6 0.5  Alkaline Phos 38 - 126 U/L 127(H) 154(H) 150(H)  AST 15 - 41 U/L '18 30 23  '$ ALT 0 - 44 U/L '14 27 23    '$ DIAGNOSTIC IMAGING:  I have independently reviewed the relevant imaging and discussed with the patient.  ASSESSMENT & PLAN: 1.  Iron deficiency anemia: - Combination anemia from CKD, malabsorption and likely blood loss. - He has been on intermittent Feraheme infusions.  (Last given Feraheme x3 from 07/17/2020 through 07/31/2020) - Colonoscopy on 05/31/2017 with 6 subcentimeter polyps removed from the descending colon  and one 8 mm polyp in the descending colon.  Multiple large and small polyps located from the area of the hepatic flexure to the cecum along with a polypoid mass which was biopsied.  Pathology consistent with tubular adenomas and hyperplastic polyps. - Hemoccult stool  was POSITIVE x3 (June 2022) - He does not report any bleeding per rectum, but does report dark bowel movements, which he "thought were normal" - Symptoms after Feraheme x3 (last dose 07/31/2020) slightly improved, reports improved energy about 3 to 4 weeks after last Feraheme - Most recent labs (10/12/2020): Hemoglobin improved at 12.1, MCV 86.0, platelets mildly elevated at 422; creatinine 1.40/GFR 55; ferritin not checked, but patient shows persistent iron deficiency with serum iron 17 and iron saturation 6% - PLAN: Recommend IV Feraheme x2.  Repeat labs and RTC in 3 months.  Referral to gastroenterology due to Hemoccult positive stool (previously seen by Dr. Gala Romney)  2.  Locally advanced basal cell carcinoma right nasolabial fold: - Biopsy of the right nasal mucosa on 04/05/2017 with basal cell carcinoma. - Biopsy of the left cheek lesion on 04/02/2017 consistent with basal cell carcinoma. - Resection with skin graft on 06/06/2017.  Margin was close.  Rest of the margins were negative. - 10 rounds of radiation by Dr. Isidore Moos for recurrent basal cell carcinoma to the right medial canthus completed on 03/20/2020.  2.  Vitamin D deficiency - Noted to have decreased vitamin D (22.19) at last visit in June 2022 - Patient was started on vitamin D 1,000 units daily  - Most recent vitamin D (10/12/2020) remains low at 18.30 - PLAN: Prescription sent for vitamin D ergocalciferol 50,000 units weekly.   PLAN SUMMARY & DISPOSITION: - Feraheme x2 - Referral to Dr. Gala Romney (GI) - Vitamin D 50,000 units weekly - Labs in 3 months - RTC after labs  All questions were answered. The patient knows to call the clinic with any problems, questions or concerns.  Medical decision making: Moderate  Time spent on visit: I spent 15 minutes counseling the patient face to face. The total time spent in the appointment was 25 minutes and more than 50% was on counseling.   Michael Rush, PA-C  10/19/2020 9:49 AM

## 2020-10-19 ENCOUNTER — Encounter (HOSPITAL_COMMUNITY): Payer: Self-pay | Admitting: Physician Assistant

## 2020-10-19 ENCOUNTER — Inpatient Hospital Stay (HOSPITAL_BASED_OUTPATIENT_CLINIC_OR_DEPARTMENT_OTHER): Payer: Medicare HMO | Admitting: Physician Assistant

## 2020-10-19 ENCOUNTER — Ambulatory Visit (HOSPITAL_COMMUNITY): Payer: Medicare Other | Admitting: Hematology

## 2020-10-19 ENCOUNTER — Other Ambulatory Visit: Payer: Self-pay

## 2020-10-19 ENCOUNTER — Encounter (HOSPITAL_COMMUNITY): Payer: Self-pay | Admitting: Hematology

## 2020-10-19 VITALS — BP 193/102 | HR 95 | Temp 97.0°F | Resp 18 | Wt 158.1 lb

## 2020-10-19 DIAGNOSIS — D500 Iron deficiency anemia secondary to blood loss (chronic): Secondary | ICD-10-CM | POA: Diagnosis not present

## 2020-10-19 DIAGNOSIS — E559 Vitamin D deficiency, unspecified: Secondary | ICD-10-CM

## 2020-10-19 DIAGNOSIS — Z85828 Personal history of other malignant neoplasm of skin: Secondary | ICD-10-CM | POA: Diagnosis not present

## 2020-10-19 DIAGNOSIS — Z79899 Other long term (current) drug therapy: Secondary | ICD-10-CM | POA: Diagnosis not present

## 2020-10-19 DIAGNOSIS — K909 Intestinal malabsorption, unspecified: Secondary | ICD-10-CM | POA: Diagnosis not present

## 2020-10-19 DIAGNOSIS — D508 Other iron deficiency anemias: Secondary | ICD-10-CM | POA: Diagnosis not present

## 2020-10-19 MED ORDER — ERGOCALCIFEROL 1.25 MG (50000 UT) PO CAPS: 50000.0000 [IU] | ORAL_CAPSULE | ORAL | 6 refills | Status: DC

## 2020-10-19 NOTE — Patient Instructions (Signed)
[place] [facility] Discharge Instructions  You were seen today by Tarri Abernethy PA-C for your iron deficiency anemia.  Your iron levels remain low, although your blood count is slightly improved.  I recommend additional IV iron with Feraheme x2 doses.  Also, your stool samples were positive for blood.  This may mean that you are bleeding from somewhere in your stomach or intestines.  We will refer you to Dr. Gala Romney (gastroenterology) for possible EGD/colonoscopy.  These procedures are done under sedation, and a camera is used to see if you have bleeding in your stomach or intestines.  LABS: Return in 3 months for repeat labs  OTHER TESTS: None at this time  MEDICATIONS: START taking vitamin D ergocalciferol 50,000 units once per week (prescription sent to pharmacy)  FOLLOW-UP APPOINTMENT: Office visit in 3 months, after labs  NOTE: Please seek immediate medical attention if you notice that you are feeling especially weak, fatigued, or have chest pain and difficulty breathing.  This may be a sign of worsening blood loss and anemia.   Thank you for choosing [facility] to provide your oncology and hematology care.  To afford each patient quality time with our provider, please arrive at least 15 minutes before your scheduled appointment time.   If you have a lab appointment with the [facility] please come in thru the Main Entrance and check in at the main information desk.  You need to re-schedule your appointment should you arrive 10 or more minutes late.  We strive to give you quality time with our providers, and arriving late affects you and other patients whose appointments are after yours.  Also, if you no show three or more times for appointments you may be dismissed from the clinic at the providers discretion.     Again, thank you for choosing [facility].  Our hope is that these requests will decrease  the amount of time that you wait before being seen by our physicians.       _____________________________________________________________  Should you have questions after your visit to [facility], please contact our office at [phone] and follow the prompts.  Our office hours are 8:00 a.m. and 4:30 p.m. Monday - Friday.  Please note that voicemails left after 4:00 p.m. may not be returned until the following business day.  We are closed weekends and major holidays.  You do have access to a nurse 24-7, just call the main number to the clinic [phone] and do not press any options, hold on the line and a nurse will answer the phone.    For prescription refill requests, have your pharmacy contact our office and allow 72 hours.    Due to Covid, you will need to wear a mask upon entering the hospital. If you do not have a mask, a mask will be given to you at the Main Entrance upon arrival. For doctor visits, patients may have 1 support person age 50 or older with them. For treatment visits, patients can not have anyone with them due to social distancing guidelines and our immunocompromised population.

## 2020-10-22 ENCOUNTER — Other Ambulatory Visit: Payer: Self-pay

## 2020-10-22 ENCOUNTER — Inpatient Hospital Stay (HOSPITAL_COMMUNITY): Payer: Medicare HMO

## 2020-10-22 ENCOUNTER — Ambulatory Visit (HOSPITAL_COMMUNITY): Payer: Medicare HMO

## 2020-10-22 VITALS — BP 179/81 | HR 65 | Temp 96.9°F | Resp 18

## 2020-10-22 DIAGNOSIS — D508 Other iron deficiency anemias: Secondary | ICD-10-CM | POA: Diagnosis not present

## 2020-10-22 DIAGNOSIS — E559 Vitamin D deficiency, unspecified: Secondary | ICD-10-CM | POA: Diagnosis not present

## 2020-10-22 DIAGNOSIS — Z85828 Personal history of other malignant neoplasm of skin: Secondary | ICD-10-CM | POA: Diagnosis not present

## 2020-10-22 DIAGNOSIS — D500 Iron deficiency anemia secondary to blood loss (chronic): Secondary | ICD-10-CM

## 2020-10-22 DIAGNOSIS — Z79899 Other long term (current) drug therapy: Secondary | ICD-10-CM | POA: Diagnosis not present

## 2020-10-22 DIAGNOSIS — K909 Intestinal malabsorption, unspecified: Secondary | ICD-10-CM | POA: Diagnosis not present

## 2020-10-22 MED ORDER — LORATADINE 10 MG PO TABS
10.0000 mg | ORAL_TABLET | Freq: Once | ORAL | Status: AC
  Administered 2020-10-22: 10 mg via ORAL
  Filled 2020-10-22: qty 1

## 2020-10-22 MED ORDER — SODIUM CHLORIDE 0.9 % IV SOLN: Freq: Once | INTRAVENOUS | Status: AC

## 2020-10-22 MED ORDER — ACETAMINOPHEN 325 MG PO TABS
650.0000 mg | ORAL_TABLET | Freq: Once | ORAL | Status: AC
  Administered 2020-10-22: 650 mg via ORAL
  Filled 2020-10-22: qty 2

## 2020-10-22 MED ORDER — SODIUM CHLORIDE 0.9 % IV SOLN
510.0000 mg | Freq: Once | INTRAVENOUS | Status: DC
  Filled 2020-10-22: qty 17

## 2020-10-22 MED ORDER — SODIUM CHLORIDE 0.9 % IV SOLN
300.0000 mg | Freq: Once | INTRAVENOUS | Status: AC
  Administered 2020-10-22: 300 mg via INTRAVENOUS
  Filled 2020-10-22: qty 300

## 2020-10-22 NOTE — Patient Instructions (Signed)
[facility]  Discharge Instructions: Thank you for choosing [facility] to provide your oncology and hematology care.  If you have a lab appointment with the Cancer Center, please come in thru the Main Entrance and check in at the main information desk.  Wear comfortable clothing and clothing appropriate for easy access to any Portacath or PICC line.   We strive to give you quality time with your provider. You may need to reschedule your appointment if you arrive late (15 or more minutes).  Arriving late affects you and other patients whose appointments are after yours.  Also, if you miss three or more appointments without notifying the office, you may be dismissed from the clinic at the provider's discretion.      For prescription refill requests, have your pharmacy contact our office and allow 72 hours for refills to be completed.    Today you received the following chemotherapy and/or immunotherapy agents Venofer      To help prevent nausea and vomiting after your treatment, we encourage you to take your nausea medication as directed.  BELOW ARE SYMPTOMS THAT SHOULD BE REPORTED IMMEDIATELY: *FEVER GREATER THAN 100.4 F (38 C) OR HIGHER *CHILLS OR SWEATING *NAUSEA AND VOMITING THAT IS NOT CONTROLLED WITH YOUR NAUSEA MEDICATION *UNUSUAL SHORTNESS OF BREATH *UNUSUAL BRUISING OR BLEEDING *URINARY PROBLEMS (pain or burning when urinating, or frequent urination) *BOWEL PROBLEMS (unusual diarrhea, constipation, pain near the anus) TENDERNESS IN MOUTH AND THROAT WITH OR WITHOUT PRESENCE OF ULCERS (sore throat, sores in mouth, or a toothache) UNUSUAL RASH, SWELLING OR PAIN  UNUSUAL VAGINAL DISCHARGE OR ITCHING   Items with * indicate a potential emergency and should be followed up as soon as possible or go to the Emergency Department if any problems should occur.  Please show the CHEMOTHERAPY ALERT CARD or IMMUNOTHERAPY ALERT CARD at check-in to the Emergency  Department and triage nurse.  Should you have questions after your visit or need to cancel or reschedule your appointment, please contact [facility] [phone]  and follow the prompts.  Office hours are 8:00 a.m. to 4:30 p.m. Monday - Friday. Please note that voicemails left after 4:00 p.m. may not be returned until the following business day.  We are closed weekends and major holidays. You have access to a nurse at all times for urgent questions. Please call the main number to the clinic [phone] and follow the prompts.  For any non-urgent questions, you may also contact your provider using MyChart. We now offer e-Visits for anyone 18 and older to request care online for non-urgent symptoms. For details visit [email or web address].   Also download the MyChart app! Go to the app store, search "MyChart", open the app, select [place], and log in with your MyChart username and password.  Due to Covid, a mask is required upon entering the hospital/clinic. If you do not have a mask, one will be given to you upon arrival. For doctor visits, patients may have 1 support person aged 18 or older with them. For treatment visits, patients cannot have anyone with them due to current Covid guidelines and our immunocompromised population.  

## 2020-10-22 NOTE — Progress Notes (Signed)
Patient presents today for Feraheme infusion per providers order.  Vital signs stable.  Patient has no new complaints at this time.    Peripheral IV started and blood return noted pre and post infusion.  Feraheme infusion  given today per MD orders.  Stable during infusion without adverse affects.  Vital signs stable.  No complaints at this time.  Discharge from clinic ambulatory in stable condition.  Alert and oriented X 3.  Follow up with [facility] as scheduled.  

## 2020-10-22 NOTE — Progress Notes (Signed)
Intravenous Iron Formulation Change  Michael [name] has insurance that requires a change in intravenous iron product from Feraheme to Venofer. Orders have been updated to reflect this change and scheduling message sent to adjust infusion appointments. Dr Delton Coombes notified and agrees with the plan.  Allergies: No Known Allergies  The plan for iron therapy is as follows: Venofer 300 mg IVPB weekly x 2 doses then last dose will be Venofer 400 mg IVPB x 1.   Michael [name] 10/22/2020

## 2020-10-29 ENCOUNTER — Ambulatory Visit (HOSPITAL_COMMUNITY): Payer: Medicare HMO

## 2020-10-30 ENCOUNTER — Inpatient Hospital Stay (HOSPITAL_COMMUNITY): Payer: Medicare HMO

## 2020-10-30 ENCOUNTER — Other Ambulatory Visit: Payer: Self-pay

## 2020-10-30 VITALS — BP 164/76 | HR 66 | Temp 96.8°F | Resp 18

## 2020-10-30 DIAGNOSIS — K909 Intestinal malabsorption, unspecified: Secondary | ICD-10-CM | POA: Diagnosis not present

## 2020-10-30 DIAGNOSIS — D500 Iron deficiency anemia secondary to blood loss (chronic): Secondary | ICD-10-CM

## 2020-10-30 DIAGNOSIS — Z85828 Personal history of other malignant neoplasm of skin: Secondary | ICD-10-CM | POA: Diagnosis not present

## 2020-10-30 DIAGNOSIS — D508 Other iron deficiency anemias: Secondary | ICD-10-CM | POA: Diagnosis not present

## 2020-10-30 DIAGNOSIS — Z79899 Other long term (current) drug therapy: Secondary | ICD-10-CM | POA: Diagnosis not present

## 2020-10-30 DIAGNOSIS — E559 Vitamin D deficiency, unspecified: Secondary | ICD-10-CM | POA: Diagnosis not present

## 2020-10-30 MED ORDER — LORATADINE 10 MG PO TABS
10.0000 mg | ORAL_TABLET | Freq: Once | ORAL | Status: AC
  Administered 2020-10-30: 10 mg via ORAL
  Filled 2020-10-30: qty 1

## 2020-10-30 MED ORDER — ACETAMINOPHEN 325 MG PO TABS
650.0000 mg | ORAL_TABLET | Freq: Once | ORAL | Status: AC
  Administered 2020-10-30: 650 mg via ORAL
  Filled 2020-10-30: qty 2

## 2020-10-30 MED ORDER — SODIUM CHLORIDE 0.9 % IV SOLN
300.0000 mg | Freq: Once | INTRAVENOUS | Status: AC
  Administered 2020-10-30: 300 mg via INTRAVENOUS
  Filled 2020-10-30: qty 300

## 2020-10-30 MED ORDER — SODIUM CHLORIDE 0.9 % IV SOLN: Freq: Once | INTRAVENOUS | Status: AC

## 2020-10-30 NOTE — Progress Notes (Signed)
Patient presents today for Venofer infusion per providers order.  Vital signs WNL.  Patient has no new complaints at this time.  Peripheral IV started and blood return noted pre and post infusion.  Venofer infusion given today per MD orders.  Stable during infusion without adverse affects.  Vital signs stable.  Patient refuses to stay the full thirty minute post infusion wait time.  Explained the importance of staying and the risks of leaving before the thirty minutes is up.  Patient still refuses.  No complaints at this time.  Discharge from clinic ambulatory in stable condition.  Alert and oriented X 3.  Follow up with [facility] as scheduled.

## 2020-10-30 NOTE — Patient Instructions (Signed)
[facility]  Discharge Instructions: Thank you for choosing [facility] to provide your oncology and hematology care.  If you have a lab appointment with the Cancer Center, please come in thru the Main Entrance and check in at the main information desk.  Wear comfortable clothing and clothing appropriate for easy access to any Portacath or PICC line.   We strive to give you quality time with your provider. You may need to reschedule your appointment if you arrive late (15 or more minutes).  Arriving late affects you and other patients whose appointments are after yours.  Also, if you miss three or more appointments without notifying the office, you may be dismissed from the clinic at the provider's discretion.      For prescription refill requests, have your pharmacy contact our office and allow 72 hours for refills to be completed.    Today you received the following chemotherapy and/or immunotherapy agents Venofer      To help prevent nausea and vomiting after your treatment, we encourage you to take your nausea medication as directed.  BELOW ARE SYMPTOMS THAT SHOULD BE REPORTED IMMEDIATELY: *FEVER GREATER THAN 100.4 F (38 C) OR HIGHER *CHILLS OR SWEATING *NAUSEA AND VOMITING THAT IS NOT CONTROLLED WITH YOUR NAUSEA MEDICATION *UNUSUAL SHORTNESS OF BREATH *UNUSUAL BRUISING OR BLEEDING *URINARY PROBLEMS (pain or burning when urinating, or frequent urination) *BOWEL PROBLEMS (unusual diarrhea, constipation, pain near the anus) TENDERNESS IN MOUTH AND THROAT WITH OR WITHOUT PRESENCE OF ULCERS (sore throat, sores in mouth, or a toothache) UNUSUAL RASH, SWELLING OR PAIN  UNUSUAL VAGINAL DISCHARGE OR ITCHING   Items with * indicate a potential emergency and should be followed up as soon as possible or go to the Emergency Department if any problems should occur.  Please show the CHEMOTHERAPY ALERT CARD or IMMUNOTHERAPY ALERT CARD at check-in to the Emergency  Department and triage nurse.  Should you have questions after your visit or need to cancel or reschedule your appointment, please contact [facility] [phone]  and follow the prompts.  Office hours are 8:00 a.m. to 4:30 p.m. Monday - Friday. Please note that voicemails left after 4:00 p.m. may not be returned until the following business day.  We are closed weekends and major holidays. You have access to a nurse at all times for urgent questions. Please call the main number to the clinic [phone] and follow the prompts.  For any non-urgent questions, you may also contact your provider using MyChart. We now offer e-Visits for anyone 18 and older to request care online for non-urgent symptoms. For details visit [email or web address].   Also download the MyChart app! Go to the app store, search "MyChart", open the app, select [place], and log in with your MyChart username and password.  Due to Covid, a mask is required upon entering the hospital/clinic. If you do not have a mask, one will be given to you upon arrival. For doctor visits, patients may have 1 support person aged 18 or older with them. For treatment visits, patients cannot have anyone with them due to current Covid guidelines and our immunocompromised population.  

## 2020-11-06 ENCOUNTER — Inpatient Hospital Stay (HOSPITAL_COMMUNITY): Payer: Medicare HMO | Attending: Hematology

## 2020-11-06 ENCOUNTER — Encounter (HOSPITAL_COMMUNITY): Payer: Self-pay

## 2020-11-06 ENCOUNTER — Other Ambulatory Visit: Payer: Self-pay

## 2020-11-06 VITALS — BP 164/89 | HR 61 | Temp 97.3°F | Resp 18

## 2020-11-06 DIAGNOSIS — K909 Intestinal malabsorption, unspecified: Secondary | ICD-10-CM | POA: Diagnosis not present

## 2020-11-06 DIAGNOSIS — D508 Other iron deficiency anemias: Secondary | ICD-10-CM | POA: Insufficient documentation

## 2020-11-06 DIAGNOSIS — D500 Iron deficiency anemia secondary to blood loss (chronic): Secondary | ICD-10-CM

## 2020-11-06 MED ORDER — LORATADINE 10 MG PO TABS
10.0000 mg | ORAL_TABLET | Freq: Once | ORAL | Status: AC
  Administered 2020-11-06: 10 mg via ORAL
  Filled 2020-11-06: qty 1

## 2020-11-06 MED ORDER — SODIUM CHLORIDE 0.9 % IV SOLN
400.0000 mg | Freq: Once | INTRAVENOUS | Status: AC
  Administered 2020-11-06: 400 mg via INTRAVENOUS
  Filled 2020-11-06: qty 20

## 2020-11-06 MED ORDER — SODIUM CHLORIDE 0.9 % IV SOLN: Freq: Once | INTRAVENOUS | Status: AC

## 2020-11-06 MED ORDER — ACETAMINOPHEN 325 MG PO TABS
650.0000 mg | ORAL_TABLET | Freq: Once | ORAL | Status: AC
  Administered 2020-11-06: 650 mg via ORAL
  Filled 2020-11-06: qty 2

## 2020-11-06 NOTE — Progress Notes (Signed)
Chaplain engaged in an initial visit introducing herself and offering support.  Chaplain will follow-up.    11/06/20 1000  Clinical Encounter Type  Visited With Patient  Visit Type Initial

## 2020-11-06 NOTE — Progress Notes (Signed)
Patient tolerated iron infusion with no complaints voiced. Vitals stable, patient refuses to stay the full thirty minute wait time post infusion. Educated patient on the importance of staying and the risk of leaving before the the thirty minutes is up, patient still request to leave. Peripheral IV site clean and dry with good blood return noted before and after infusion. Band aid applied. VSS with discharge and left in satisfactory condition with no s/s of distress noted.

## 2020-11-06 NOTE — Patient Instructions (Signed)
[facility]  Discharge Instructions: Thank you for choosing [facility] to provide your oncology and hematology care.  If you have a lab appointment with the Cancer Center, please come in thru the Main Entrance and check in at the main information desk.  Wear comfortable clothing and clothing appropriate for easy access to any Portacath or PICC line.   We strive to give you quality time with your provider. You may need to reschedule your appointment if you arrive late (15 or more minutes).  Arriving late affects you and other patients whose appointments are after yours.  Also, if you miss three or more appointments without notifying the office, you may be dismissed from the clinic at the provider's discretion.      For prescription refill requests, have your pharmacy contact our office and allow 72 hours for refills to be completed.    Today you received the following: Venofer, return as scheduled.   To help prevent nausea and vomiting after your treatment, we encourage you to take your nausea medication as directed.  BELOW ARE SYMPTOMS THAT SHOULD BE REPORTED IMMEDIATELY: *FEVER GREATER THAN 100.4 F (38 C) OR HIGHER *CHILLS OR SWEATING *NAUSEA AND VOMITING THAT IS NOT CONTROLLED WITH YOUR NAUSEA MEDICATION *UNUSUAL SHORTNESS OF BREATH *UNUSUAL BRUISING OR BLEEDING *URINARY PROBLEMS (pain or burning when urinating, or frequent urination) *BOWEL PROBLEMS (unusual diarrhea, constipation, pain near the anus) TENDERNESS IN MOUTH AND THROAT WITH OR WITHOUT PRESENCE OF ULCERS (sore throat, sores in mouth, or a toothache) UNUSUAL RASH, SWELLING OR PAIN  UNUSUAL VAGINAL DISCHARGE OR ITCHING   Items with * indicate a potential emergency and should be followed up as soon as possible or go to the Emergency Department if any problems should occur.  Please show the CHEMOTHERAPY ALERT CARD or IMMUNOTHERAPY ALERT CARD at check-in to the Emergency Department and triage  nurse.  Should you have questions after your visit or need to cancel or reschedule your appointment, please contact  CANCER CENTER [phone]  and follow the prompts.  Office hours are 8:00 a.m. to 4:30 p.m. Monday - Friday. Please note that voicemails left after 4:00 p.m. may not be returned until the following business day.  We are closed weekends and major holidays. You have access to a nurse at all times for urgent questions. Please call the main number to the clinic [phone] and follow the prompts.  For any non-urgent questions, you may also contact your provider using MyChart. We now offer e-Visits for anyone 18 and older to request care online for non-urgent symptoms. For details visit [email or web address].   Also download the MyChart app! Go to the app store, search "MyChart", open the app, select [place], and log in with your MyChart username and password.  Due to Covid, a mask is required upon entering the hospital/clinic. If you do not have a mask, one will be given to you upon arrival. For doctor visits, patients may have 1 support person aged 18 or older with them. For treatment visits, patients cannot have anyone with them due to current Covid guidelines and our immunocompromised population.  

## 2020-11-09 ENCOUNTER — Other Ambulatory Visit: Payer: Self-pay

## 2020-11-09 ENCOUNTER — Ambulatory Visit (HOSPITAL_COMMUNITY)
Admission: RE | Admit: 2020-11-09 | Discharge: 2020-11-09 | Disposition: A | Discharge status: Home / Self Care | Payer: Medicare HMO | Source: Ambulatory Visit | Attending: Vascular Surgery | Admitting: Vascular Surgery

## 2020-11-09 ENCOUNTER — Ambulatory Visit (INDEPENDENT_AMBULATORY_CARE_PROVIDER_SITE_OTHER): Payer: Medicare HMO | Admitting: Physician Assistant

## 2020-11-09 ENCOUNTER — Encounter: Payer: Self-pay | Admitting: Internal Medicine

## 2020-11-09 VITALS — BP 183/91 | HR 67 | Temp 97.5°F | Resp 20 | Ht 72.0 in | Wt 159.6 lb

## 2020-11-09 DIAGNOSIS — I6521 Occlusion and stenosis of right carotid artery: Secondary | ICD-10-CM

## 2020-11-09 DIAGNOSIS — I6529 Occlusion and stenosis of unspecified carotid artery: Secondary | ICD-10-CM | POA: Diagnosis not present

## 2020-11-09 NOTE — Progress Notes (Signed)
Carotid Artery Follow-Up   VASCULAR SURGERY ASSESSMENT & PLAN:   Michael [name] is a 65 y.o. male who is being followed by Dr. Oneida Alar for carotid artery stenosis.  In March of this year, his carotid duplex revealed 80 to 99% stenosis of the right internal carotid artery.  Patient has history of BCC of nasolabial grove with surgical treatment. The plan was to proceed with repeat CTA and possible TCAR, however the patient had an elevated creatinine and was instructed to consult with his primary care team in regards to stopping his ACE inhibitor.  He states this was discontinued.  He continues to smoke approxi-1/2 pack of cigarettes daily.  Right internal carotid artery stenosis: The patient has no symptoms referable to carotid artery stenosis.  Duplex examination today is unchanged as compared to 6 months ago.  The patient's serum creatinine 4 weeks ago was down to 1.40.  We will arrange follow-up CTA in the next few weeks and follow-up with surgeon after this test is completed.   We reviewed the signs and symptoms of stroke/TIA and advised the patient to call EMS should these occur.   Continue optimal medical management of hypertension, CKD and follow-up with primary care physician. Encouraged complete smoking cessation. Continue the following medications: Aspirin, statin   SUBJECTIVE:   The patient denies monocular blindness, slurred speech, facial drooping, extremity weakness or numbness.  PHYSICAL EXAM:   Vitals:   11/09/20 1030  BP: (!) 183/91  Pulse: 67  Resp: 20  Temp: (!) 97.5 F (36.4 C)  SpO2: 99%     General appearance: Well-developed, well-nourished in no apparent distress Neurologic: Alert and oriented x4, tongue is midline, facial deformity, speech fluent, 5 out of 5 bilateral upper extremity grip strength, triceps and biceps strength Cardiovascular: Heart rate and rhythm are regular. Right carotid bruit. Respirations: Nonlabored Abdomen: No palpable pulsatile  mass   NON-INVASIVE VASCULAR STUDIES   11/09/2020  Summary:  Right Carotid: Velocities in the right ICA are consistent with a 80-99% stenosis. The ECA appears >50% stenosed.   Left Carotid: Velocities in the left ICA are consistent with a 1-39% stenosis.   Vertebrals:  Bilateral vertebral arteries demonstrate antegrade flow.  Subclavians: Normal flow hemodynamics were seen in bilateral subclavian arteries.   *See table(s) above for measurements and observations.       Preliminary   PROBLEM LIST:    The patient's past medical history, past surgical history, family history, social history, allergy list and medication list are reviewed.   CURRENT MEDS:    Current Outpatient Medications:    aspirin EC 81 MG tablet, Take 81 mg by mouth daily., Disp: , Rfl:    atorvastatin (LIPITOR) 40 MG tablet, Take 1 tablet (40 mg total) by mouth every evening., Disp: 90 tablet, Rfl: 3   ergocalciferol (VITAMIN D2) 1.25 MG (50000 UT) capsule, Take 1 capsule (50,000 Units total) by mouth once a week., Disp: 5 capsule, Rfl: 6   ferrous sulfate 325 (65 FE) MG EC tablet, Take 1 tablet (325 mg total) by mouth daily with breakfast., Disp: 30 tablet, Rfl: 3   isosorbide mononitrate (IMDUR) 30 MG 24 hr tablet, Take 1 tablet (30 mg total) by mouth every evening., Disp: 90 tablet, Rfl: 3   lisinopril (ZESTRIL) 10 MG tablet, Take 10 mg by mouth daily., Disp: , Rfl:    metoprolol succinate (TOPROL-XL) 25 MG 24 hr tablet, Take 1 tablet (25 mg total) by mouth every evening., Disp: 90 tablet, Rfl: 3   sulfamethoxazole-trimethoprim (  BACTRIM DS) 800-160 MG tablet, Take 1 tablet by mouth every 12 (twelve) hours., Disp: 14 tablet, Rfl: 0   temazepam (RESTORIL) 15 MG capsule, Take 1 capsule (15 mg total) by mouth at bedtime as needed for sleep., Disp: 30 capsule, Rfl: 0   Vitamin D, Cholecalciferol, 25 MCG (1000 UT) CAPS, Take 1 capsule by mouth daily., Disp: , Rfl:    nitroGLYCERIN (NITROSTAT) 0.4 MG SL tablet, Place  1 tablet (0.4 mg total) under the tongue every 5 (five) minutes as needed for chest pain., Disp: 90 tablet, Rfl: 3   REVIEW OF SYSTEMS:   [X]  denotes positive finding, [ ]  denotes negative finding Cardiac  Comments:  Chest pain or chest pressure:    Shortness of breath upon exertion:    Short of breath when lying flat:    Irregular heart rhythm:        Vascular    Pain in calf, thigh, or hip brought on by ambulation:    Pain in feet at night that wakes you up from your sleep:     Blood clot in your veins:    Leg swelling:         Pulmonary    Oxygen at home:    Productive cough:     Wheezing:         Neurologic    Sudden weakness in arms or legs:     Sudden numbness in arms or legs:     Sudden onset of difficulty speaking or slurred speech:    Temporary loss of vision in one eye:     Problems with dizziness:         Gastrointestinal    Blood in stool:     Vomited blood:         Genitourinary    Burning when urinating:     Blood in urine:        Psychiatric    Major depression:         Hematologic    Bleeding problems:    Problems with blood clotting too easily:        Skin    Rashes or ulcers:        Constitutional    Fever or chills:     Barbie Banner, PA-C  Office: [phone] 11/09/2020 Dr. Trula Slade

## 2020-11-25 ENCOUNTER — Ambulatory Visit: Payer: Medicare HMO | Admitting: Vascular Surgery

## 2020-12-09 ENCOUNTER — Other Ambulatory Visit: Payer: Self-pay

## 2020-12-09 DIAGNOSIS — I6521 Occlusion and stenosis of right carotid artery: Secondary | ICD-10-CM

## 2020-12-14 ENCOUNTER — Ambulatory Visit (HOSPITAL_COMMUNITY)
Admission: RE | Admit: 2020-12-14 | Discharge: 2020-12-14 | Disposition: A | Discharge status: Home / Self Care | Payer: Medicare HMO | Source: Ambulatory Visit | Attending: Vascular Surgery | Admitting: Vascular Surgery

## 2020-12-14 ENCOUNTER — Other Ambulatory Visit: Payer: Self-pay

## 2020-12-14 DIAGNOSIS — I6503 Occlusion and stenosis of bilateral vertebral arteries: Secondary | ICD-10-CM | POA: Diagnosis not present

## 2020-12-14 DIAGNOSIS — I6521 Occlusion and stenosis of right carotid artery: Secondary | ICD-10-CM | POA: Insufficient documentation

## 2020-12-14 DIAGNOSIS — D1800 Hemangioma unspecified site: Secondary | ICD-10-CM | POA: Diagnosis not present

## 2020-12-14 DIAGNOSIS — I672 Cerebral atherosclerosis: Secondary | ICD-10-CM | POA: Diagnosis not present

## 2020-12-14 DIAGNOSIS — I6523 Occlusion and stenosis of bilateral carotid arteries: Secondary | ICD-10-CM | POA: Diagnosis not present

## 2020-12-14 LAB — POCT I-STAT CREATININE: Creatinine, Ser: 1.6 mg/dL — ABNORMAL HIGH (ref 0.61–1.24)

## 2020-12-14 MED ORDER — IOHEXOL 350 MG/ML SOLN
100.0000 mL | Freq: Once | INTRAVENOUS | Status: AC | PRN
  Administered 2020-12-14: 100 mL via INTRAVENOUS

## 2020-12-21 ENCOUNTER — Other Ambulatory Visit: Payer: Self-pay

## 2020-12-21 DIAGNOSIS — I6529 Occlusion and stenosis of unspecified carotid artery: Secondary | ICD-10-CM

## 2021-01-06 ENCOUNTER — Ambulatory Visit: Payer: Medicare HMO | Admitting: Vascular Surgery

## 2021-01-08 ENCOUNTER — Other Ambulatory Visit: Payer: Self-pay

## 2021-01-08 ENCOUNTER — Encounter: Payer: Self-pay | Admitting: Cardiology

## 2021-01-08 ENCOUNTER — Ambulatory Visit (INDEPENDENT_AMBULATORY_CARE_PROVIDER_SITE_OTHER): Payer: Medicare HMO | Admitting: Cardiology

## 2021-01-08 VITALS — BP 146/84 | HR 79 | Ht 72.0 in | Wt 170.8 lb

## 2021-01-08 DIAGNOSIS — N1832 Chronic kidney disease, stage 3b: Secondary | ICD-10-CM

## 2021-01-08 DIAGNOSIS — I25119 Atherosclerotic heart disease of native coronary artery with unspecified angina pectoris: Secondary | ICD-10-CM | POA: Diagnosis not present

## 2021-01-08 DIAGNOSIS — J449 Chronic obstructive pulmonary disease, unspecified: Secondary | ICD-10-CM | POA: Diagnosis not present

## 2021-01-08 DIAGNOSIS — I509 Heart failure, unspecified: Secondary | ICD-10-CM | POA: Diagnosis not present

## 2021-01-08 DIAGNOSIS — I6523 Occlusion and stenosis of bilateral carotid arteries: Secondary | ICD-10-CM | POA: Diagnosis not present

## 2021-01-08 NOTE — Progress Notes (Signed)
Cardiology Office Note  Date: 01/08/2021   ID: Michael [name], DOB 1955-04-03, MRN [id number]  PCP:  [name], MD  Cardiologist:  Rozann Lesches, MD Electrophysiologist:  None   Chief Complaint  Patient presents with   Cardiac follow-up    History of Present Illness: Michael [name] is a 65 y.o. male last seen in June.  He is here for a follow-up visit.  From a cardiac perspective, he does not describe any angina symptoms or nitroglycerin use.  I went over his medications which are noted below, he is in the process of changing his pharmacy provider.  He had been following with Dr. Oneida Alar with [facility] for management of carotid artery disease.  He was seen in October with carotid Dopplers, I reviewed the note and results.  He then underwent a CTA of the neck as well, results noted below.  Looks to have fairly severe RICA stenosis and is scheduled to see Dr. Donnetta Hutching next week.  I personally reviewed his ECG today which shows sinus rhythm with prolonged PR interval, PACs and PVCs, and increased voltage with repolarization abnormalities.  Past Medical History:  Diagnosis Date   Carotid artery disease (HCC)    Chronic diastolic heart failure (HCC)    CKD (chronic kidney disease) stage 3, GFR 30-59 ml/min (HCC)    COPD (chronic obstructive pulmonary disease) (HCC)    Coronary artery disease    Essential hypertension    Head and neck cancer ([place]) 2019   Right facial basal cell carcinoma s/p resection with right partial mastectomy and partal rhinectomy with skin graft (06/13/17)   Iron deficiency anemia    Urinary retention     Past Surgical History:  Procedure Laterality Date   ANKLE CLOSED REDUCTION Right    open reduction   BASAL CELL CARCINOMA EXCISION  2019   at [facility]   COLONOSCOPY N/A 05/31/2017   Procedure: COLONOSCOPY;  Surgeon: Daneil Dolin, MD;  Location: [facility];  Service: Endoscopy;  Laterality: N/A;  2:45pm   CYSTOSCOPY N/A 10/29/2018   Procedure: CYSTOSCOPY, CLOT  EVACUATION;  Surgeon: Ceasar Mons, MD;  Location: [facility];  Service: Urology;  Laterality: N/A;   POLYPECTOMY  05/31/2017   Procedure: POLYPECTOMY;  Surgeon: Daneil Dolin, MD;  Location: [facility];  Service: Endoscopy;;   TONSILLECTOMY     XI ROBOTIC ASSISTED SIMPLE PROSTATECTOMY N/A 10/29/2018   Procedure: XI ROBOTIC ASSISTED SIMPLE PROSTATECTOMY;  Surgeon: Cleon Gustin, MD;  Location: [facility];  Service: Urology;  Laterality: N/A;    Current Outpatient Medications  Medication Sig Dispense Refill   aspirin EC 81 MG tablet Take 81 mg by mouth daily.     atorvastatin (LIPITOR) 40 MG tablet Take 1 tablet (40 mg total) by mouth every evening. 90 tablet 3   ergocalciferol (VITAMIN D2) 1.25 MG (50000 UT) capsule Take 1 capsule (50,000 Units total) by mouth once a week. 5 capsule 6   ferrous sulfate 325 (65 FE) MG EC tablet Take 1 tablet (325 mg total) by mouth daily with breakfast. 30 tablet 3   isosorbide mononitrate (IMDUR) 30 MG 24 hr tablet Take 1 tablet (30 mg total) by mouth every evening. 90 tablet 3   metoprolol succinate (TOPROL-XL) 25 MG 24 hr tablet Take 1 tablet (25 mg total) by mouth every evening. 90 tablet 3   nitroGLYCERIN (NITROSTAT) 0.4 MG SL tablet Place 1 tablet (0.4 mg total) under the tongue every 5 (five) minutes as needed for  chest pain. 90 tablet 3   sulfamethoxazole-trimethoprim (BACTRIM DS) 800-160 MG tablet Take 1 tablet by mouth every 12 (twelve) hours. 14 tablet 0   temazepam (RESTORIL) 15 MG capsule Take 1 capsule (15 mg total) by mouth at bedtime as needed for sleep. 30 capsule 0   Vitamin D, Cholecalciferol, 25 MCG (1000 UT) CAPS Take 1 capsule by mouth daily.     lisinopril (ZESTRIL) 10 MG tablet Take 10 mg by mouth daily. (Patient not taking: Reported on 01/08/2021)     No current facility-administered medications for this visit.   Allergies:  Patient has no known allergies.   ROS: No palpitations or syncope.  Physical Exam: VS:  BP (!)  146/84   Pulse 79   Ht 6' (1.829 m)   Wt 170 lb 12.8 oz (77.5 kg)   SpO2 99%   BMI 23.16 kg/m , BMI Body mass index is 23.16 kg/m.  Wt Readings from Last 3 Encounters:  01/08/21 170 lb 12.8 oz (77.5 kg)  11/09/20 159 lb 9.6 oz (72.4 kg)  10/19/20 158 lb 1.6 oz (71.7 kg)    General: Patient appears comfortable at rest. HEENT: Conjunctiva and lids normal, wearing a mask. Neck: Supple, no elevated JVP, bilateral carotid bruits right greater than left. Lungs: Clear to auscultation, nonlabored breathing at rest. Cardiac: Regular rate and rhythm, no S3, 1/6 systolic murmur. Extremities: No pitting edema.  ECG:  An ECG dated 12/21/2019 was personally reviewed today and demonstrated:  Sinus rhythm with prolonged PR interval, diffuse ST-T wave abnormalities.  Recent Labwork: 10/12/2020: ALT 14; AST 18; BUN 15; Hemoglobin 12.1; Magnesium 2.0; Platelets 422; Potassium 3.8; Sodium 134 12/14/2020: Creatinine, Ser 1.60   Other Studies Reviewed Today:  Echocardiogram 12/05/2017: - Left ventricle: The cavity size was normal. Wall thickness was    increased in a pattern of mild LVH. Systolic function was mildly    reduced. The estimated ejection fraction was in the range of 45%    to 50%. There is hypokinesis of the basal-midinferolateral    myocardium. Features are consistent with a pseudonormal left    ventricular filling pattern, with concomitant abnormal relaxation    and increased filling pressure (grade 2 diastolic dysfunction).  - Aortic valve: Mildly calcified annulus. Trileaflet; mildly    calcified leaflets.  - Mitral valve: Mildly calcified annulus. There was mild    regurgitation.  - Left atrium: The atrium was mildly dilated.  - Tricuspid valve: There was trivial regurgitation.  - Pulmonary arteries: Systolic pressure could not be accurately    estimated.  - Pericardium, extracardiac: A trivial pericardial effusion was    identified anterior to the heart.   Carotid Dopplers  11/09/2020: Summary:  Right Carotid: Velocities in the right ICA are consistent with a 80-99%                 stenosis. The ECA appears >50% stenosed.   Left Carotid: Velocities in the left ICA are consistent with a 1-39%  stenosis.   Vertebrals:  Bilateral vertebral arteries demonstrate antegrade flow.  Subclavians: Normal flow hemodynamics were seen in bilateral subclavian               arteries.   CTA neck 12/14/2020: IMPRESSION: 1. Bulky calcified plaque at the Right ICA origin and bulb has mildly progressed since 2020 with high-grade stenosis approaching a RADIOGRAPHIC STRING SIGN over a segment of 7-8 mm.   2. Soft and calcified plaque of the cervical left carotid without significant stenosis.  3. Dominant Right Vertebral Artery with stable moderate tandem stenoses in the neck, moderate to severe right V4 segment stenosis just proximal to PICA. But the non dominant and diminutive Left Vertebral Artery is atherosclerotic and now FUNCTIONALLY OCCLUDED distal to the Left PICA origin, new since 2020.   4. Aortic Atherosclerosis (ICD10-I70.0).  Assessment and Plan:  1.  Ischemic heart disease based on previous noninvasive cardiac imaging with plan for medical therapy in the absence of progressive angina symptoms.  LVEF 45 to 50% range, appears euvolemic on examination.  Plan to continue aspirin, Lipitor, Imdur, lisinopril, and Toprol-XL.  2.  Carotid artery disease, severe R ICA stenosis as discussed above with plan to follow-up with Dr. Donnetta Hutching next week.  He is on aspirin and statin.  3.  CKD stage IIIb, last creatinine 1.6.  Medication Adjustments/Labs and Tests Ordered: Current medicines are reviewed at length with the patient today.  Concerns regarding medicines are outlined above.   Tests Ordered: Orders Placed This Encounter  Procedures   EKG 12-Lead    Medication Changes: No orders of the defined types were placed in this encounter.   Disposition:  Follow up   6 months.  Signed, Satira Sark, MD, [facility] 01/08/2021 9:02 AM    [facility] [place], [place], [place] [address] Phone: [phone]; Fax: [phone]

## 2021-01-08 NOTE — Patient Instructions (Signed)
Medication Instructions:  Your physician recommends that you continue on your current medications as directed. Please refer to the Current Medication list given to you today.   Labwork: None  Testing/Procedures: None  Follow-Up: 6 months  Any Other Special Instructions Will Be Listed Below (If Applicable).     If you need a refill on your cardiac medications before your next appointment, please call your pharmacy.   

## 2021-01-12 ENCOUNTER — Inpatient Hospital Stay (HOSPITAL_COMMUNITY): Payer: Medicare HMO | Attending: Hematology

## 2021-01-12 DIAGNOSIS — N183 Chronic kidney disease, stage 3 unspecified: Secondary | ICD-10-CM | POA: Diagnosis not present

## 2021-01-12 DIAGNOSIS — K909 Intestinal malabsorption, unspecified: Secondary | ICD-10-CM | POA: Insufficient documentation

## 2021-01-12 DIAGNOSIS — E559 Vitamin D deficiency, unspecified: Secondary | ICD-10-CM

## 2021-01-12 DIAGNOSIS — D508 Other iron deficiency anemias: Secondary | ICD-10-CM | POA: Diagnosis not present

## 2021-01-12 DIAGNOSIS — D631 Anemia in chronic kidney disease: Secondary | ICD-10-CM | POA: Diagnosis not present

## 2021-01-12 DIAGNOSIS — D500 Iron deficiency anemia secondary to blood loss (chronic): Secondary | ICD-10-CM

## 2021-01-12 LAB — CBC WITH DIFFERENTIAL/PLATELET
Abs Immature Granulocytes: 0.03 10*3/uL (ref 0.00–0.07)
Basophils Absolute: 0.1 10*3/uL (ref 0.0–0.1)
Basophils Relative: 1 %
Eosinophils Absolute: 0.3 10*3/uL (ref 0.0–0.5)
Eosinophils Relative: 3 %
HCT: 41.2 % (ref 39.0–52.0)
Hemoglobin: 12.9 g/dL — ABNORMAL LOW (ref 13.0–17.0)
Immature Granulocytes: 0 %
Lymphocytes Relative: 22 %
Lymphs Abs: 1.9 10*3/uL (ref 0.7–4.0)
MCH: 27.7 pg (ref 26.0–34.0)
MCHC: 31.3 g/dL (ref 30.0–36.0)
MCV: 88.6 fL (ref 80.0–100.0)
Monocytes Absolute: 0.8 10*3/uL (ref 0.1–1.0)
Monocytes Relative: 10 %
Neutro Abs: 5.5 10*3/uL (ref 1.7–7.7)
Neutrophils Relative %: 64 %
Platelets: 278 10*3/uL (ref 150–400)
RBC: 4.65 MIL/uL (ref 4.22–5.81)
RDW: 13.9 % (ref 11.5–15.5)
WBC: 8.6 10*3/uL (ref 4.0–10.5)
nRBC: 0 % (ref 0.0–0.2)

## 2021-01-12 LAB — IRON AND TIBC
Iron: 38 ug/dL — ABNORMAL LOW (ref 45–182)
Saturation Ratios: 9 % — ABNORMAL LOW (ref 17.9–39.5)
TIBC: 431 ug/dL (ref 250–450)
UIBC: 393 ug/dL

## 2021-01-12 LAB — FERRITIN: Ferritin: 25 ng/mL (ref 24–336)

## 2021-01-12 LAB — VITAMIN D 25 HYDROXY (VIT D DEFICIENCY, FRACTURES): Vit D, 25-Hydroxy: 20.41 ng/mL — ABNORMAL LOW (ref 30–100)

## 2021-01-13 ENCOUNTER — Encounter: Payer: Self-pay | Admitting: Vascular Surgery

## 2021-01-13 ENCOUNTER — Ambulatory Visit (INDEPENDENT_AMBULATORY_CARE_PROVIDER_SITE_OTHER): Payer: Medicare HMO | Admitting: Vascular Surgery

## 2021-01-13 ENCOUNTER — Other Ambulatory Visit: Payer: Self-pay

## 2021-01-13 VITALS — BP 191/84 | HR 88 | Temp 97.9°F | Resp 20 | Ht 72.0 in | Wt 168.2 lb

## 2021-01-13 DIAGNOSIS — I6529 Occlusion and stenosis of unspecified carotid artery: Secondary | ICD-10-CM

## 2021-01-13 NOTE — Progress Notes (Signed)
[facility]  Patient name: Michael [name] MRN: [id number] DOB: May 23, 1955 Sex: male  REASON FOR VISIT: Continued discussion of severe asymptomatic right internal carotid artery stenosis  HPI: Michael [name] is a 65 y.o. male here today for discussion of right internal carotid artery stenosis.  He has a complex past history.  He had discovery of a high-grade asymptomatic stenosis prior to 2019.  He had a right facial basal cell carcinoma and underwent extensive resection with a partial partial mastoidectomy and partial rhinectomy and had free flap from his left arm.  He had been seeing Dr. Juanda [place]rum[place] in our office and felt that it would be appropriate to complete his surgical and potential radiation therapy treatment prior to addressing his asymptomatic carotid disease.  He was seen in March and plan was to obtain a CT angiogram in preparation for probable right TCAR.  He was found to have elevated creatinine and therefore had his CT scan postponed.  This was finally accomplished on 12/14/2020 and he is here today for discussion of this.  Fortunately he has remained asymptomatic.  Past Medical History:  Diagnosis Date   Carotid artery disease (HCC)    Chronic diastolic heart failure (HCC)    CKD (chronic kidney disease) stage 3, GFR 30-59 ml/min (HCC)    COPD (chronic obstructive pulmonary disease) (HCC)    Coronary artery disease    Essential hypertension    Head and neck cancer ([place]) 2019   Right facial basal cell carcinoma s/p resection with right partial mastectomy and partal rhinectomy with skin graft (06/13/17)   Iron deficiency anemia    Urinary retention     Family History  Problem Relation Age of Onset   Stroke Father    Cirrhosis Mother    Colon cancer Neg Hx     SOCIAL HISTORY: Social History   Tobacco Use   Smoking status: Every Day    Packs/day: 0.50    Years: 40.00    Pack years: 20.00    Types:  Cigarettes    Last attempt to quit: 12/01/2019    Years since quitting: 1.1   Smokeless tobacco: Never  Substance Use Topics   Alcohol use: Not Currently    No Known Allergies  Current Outpatient Medications  Medication Sig Dispense Refill   aspirin EC 81 MG tablet Take 81 mg by mouth daily.     atorvastatin (LIPITOR) 40 MG tablet Take 1 tablet (40 mg total) by mouth every evening. 90 tablet 3   ergocalciferol (VITAMIN D2) 1.25 MG (50000 UT) capsule Take 1 capsule (50,000 Units total) by mouth once a week. 5 capsule 6   ferrous sulfate 325 (65 FE) MG EC tablet Take 1 tablet (325 mg total) by mouth daily with breakfast. 30 tablet 3   isosorbide mononitrate (IMDUR) 30 MG 24 hr tablet Take 1 tablet (30 mg total) by mouth every evening. 90 tablet 3   metoprolol succinate (TOPROL-XL) 25 MG 24 hr tablet Take 1 tablet (25 mg total) by mouth every evening. 90 tablet 3   sulfamethoxazole-trimethoprim (BACTRIM DS) 800-160 MG tablet Take 1 tablet by mouth every 12 (twelve) hours. 14 tablet 0   temazepam (RESTORIL) 15 MG capsule Take 1 capsule (15 mg total) by mouth at bedtime as needed for sleep. 30 capsule 0   Vitamin D, Cholecalciferol, 25 MCG (1000 UT) CAPS Take 1 capsule by mouth daily.     lisinopril (ZESTRIL) 10 MG tablet Take 10 mg by mouth  daily. (Patient not taking: Reported on 01/08/2021)     nitroGLYCERIN (NITROSTAT) 0.4 MG SL tablet Place 1 tablet (0.4 mg total) under the tongue every 5 (five) minutes as needed for chest pain. 90 tablet 3   No current facility-administered medications for this visit.    REVIEW OF SYSTEMS:  [X]  denotes positive finding, [ ]  denotes negative finding Cardiac  Comments:  Chest pain or chest pressure:    Shortness of breath upon exertion:    Short of breath when lying flat:    Irregular heart rhythm:        Vascular    Pain in calf, thigh, or hip brought on by ambulation:    Pain in feet at night that wakes you up from your sleep:     Blood clot in  your veins:    Leg swelling:           PHYSICAL EXAM: Vitals:   01/13/21 0925 01/13/21 0926 01/13/21 0927 01/13/21 0928  BP: (!) 208/101 (!) 196/113 (!) 204/101 (!) 191/84  Pulse: 88     Resp: 20     Temp: 97.9 F (36.6 C)     TempSrc: Oral     SpO2: 98%     Weight: 168 lb 3.2 oz (76.3 kg)     Height: 6' (1.829 m)       GENERAL: The patient is a well-nourished male, in no acute distress. The vital signs are documented above.  Extensive resection of his right nose and face with reconstruction. CARDIOVASCULAR: Carotid bruit on the right no on the left.  2+ radial pulses bilaterally PULMONARY: There is good air exchange  MUSCULOSKELETAL: There are no major deformities or cyanosis. NEUROLOGIC: No focal weakness or paresthesias are detected. SKIN: There are no ulcers or rashes noted. PSYCHIATRIC: The patient has a normal affect.  DATA:  I reviewed his CT scan and discussed this at length with the patient.  He does have a high-grade stenosis that is now progressed to a string sign on the right.  This is compared to his 2020 CTA.  He does have function functionally occluded left vertebral artery with moderate to severe tandem lesions in his right vertebral artery.  MEDICAL ISSUES: I discussed options with the patient.  Dr. Oneida Alar had recommended TCAR.  I did review the films with Dr. Fortunato Curling in [place] who also reviewed these with the [facility] clinical representative.  All are in agreement that TCAR would be the best treatment option for this patient.  I discussed the procedure in length with the patient and also the slight risk for perioperative stroke.  I did offer in person visit with Dr. Carlis Abbott in [place] but he is comfortable with our discussion and will meet him on the day of procedure.  We will plan to proceed with right TCAR at this patient's convenience    Rosetta Posner, MD FACS [facility] Office Tel [phone]  Note: Portions of this  report may have been transcribed using voice recognition software.  Every effort has been made to ensure accuracy; however, inadvertent computerized transcription errors may still be present.

## 2021-01-14 ENCOUNTER — Other Ambulatory Visit: Payer: Self-pay | Admitting: *Deleted

## 2021-01-14 ENCOUNTER — Encounter: Payer: Self-pay | Admitting: *Deleted

## 2021-01-14 DIAGNOSIS — I6523 Occlusion and stenosis of bilateral carotid arteries: Secondary | ICD-10-CM

## 2021-01-18 NOTE — Progress Notes (Signed)
[place], [place] [address]   CLINIC:  Medical Oncology/Hematology  PCP:  [name], MD [place] [address] [phone]   REASON FOR VISIT:  Follow-up for iron deficiency anemia  PRIOR THERAPY: None  CURRENT THERAPY: Intermittent IV iron infusions (most recently given Venofer 300 mg x 3 with last dose on 11/06/2020)  INTERVAL HISTORY:  Michael [name] 64 y.o. male returns for routine follow-up of his iron deficiency anemia.  He was last seen by Tarri Abernethy PA-C on 10/19/2020.  At today's visit, he reports feeling fairly well.  No recent hospitalizations, surgeries, or changes in baseline health status.  Patient reports that he did not feel any significant improvement in his energy after IV iron.  He still has intermittent black bowel movements as well as "red spots" in his stool.  He reports ongoing fatigue.  He denies any pica, restless leg symptoms, headaches, chest pain, dyspnea on exertion, lightheadedness, or syncope.  Patient requests sleeping pills during today's visit, but is informed that he should follow-up with his primary care for this.  He has 25% energy and 50% appetite. He endorses that he is maintaining a stable weight.    REVIEW OF SYSTEMS:  Review of Systems  Constitutional:  Positive for fatigue. Negative for appetite change, chills, diaphoresis, fever and unexpected weight change.  HENT:   Negative for lump/mass and nosebleeds.   Eyes:  Negative for eye problems.  Respiratory:  Negative for cough, hemoptysis and shortness of breath.   Cardiovascular:  Negative for chest pain, leg swelling and palpitations.  Gastrointestinal:  Positive for blood in stool. Negative for abdominal pain, constipation, diarrhea, nausea and vomiting.  Genitourinary:  Negative for hematuria.   Skin: Negative.   Neurological:  Negative for dizziness, headaches and light-headedness.  Hematological:  Does not  bruise/bleed easily.     PAST MEDICAL/SURGICAL HISTORY:  Past Medical History:  Diagnosis Date   Carotid artery disease (HCC)    Chronic diastolic heart failure (HCC)    CKD (chronic kidney disease) stage 3, GFR 30-59 ml/min (HCC)    COPD (chronic obstructive pulmonary disease) (HCC)    Coronary artery disease    Essential hypertension    Head and neck cancer ([facility]) 2019   Right facial basal cell carcinoma s/p resection with right partial mastectomy and partal rhinectomy with skin graft (06/13/17)   Iron deficiency anemia    Urinary retention    Past Surgical History:  Procedure Laterality Date   ANKLE CLOSED REDUCTION Right    open reduction   BASAL CELL CARCINOMA EXCISION  2019   at [facility]   COLONOSCOPY N/A 05/31/2017   Procedure: COLONOSCOPY;  Surgeon: Daneil Dolin, MD;  [facility];  Service: Endoscopy;  Laterality: N/A;  2:45pm   CYSTOSCOPY N/A 10/29/2018   Procedure: CYSTOSCOPY, CLOT EVACUATION;  Surgeon: Ceasar Mons, MD;  Location: [facility];  Service: Urology;  Laterality: N/A;   POLYPECTOMY  05/31/2017   Procedure: POLYPECTOMY;  Surgeon: Daneil Dolin, MD;  [facility]: [facility];  Service: Endoscopy;;   TONSILLECTOMY     XI ROBOTIC ASSISTED SIMPLE PROSTATECTOMY N/A 10/29/2018   Procedure: XI ROBOTIC ASSISTED SIMPLE PROSTATECTOMY;  Surgeon: Cleon Gustin, MD;  Location: [facility];  Service: Urology;  Laterality: N/A;     SOCIAL HISTORY:  Social History   Socioeconomic History   Marital status: Married    Spouse name: Not on file  Number of children: Not on file   Years of education: Not on file   Highest education level: Not on file  Occupational History   Not on file  Tobacco Use   Smoking status: Every Day    Packs/day: 0.50    Years: 40.00    Pack years: 20.00    Types: Cigarettes    Last attempt to quit: 12/01/2019    Years since quitting: 1.1   Smokeless tobacco: Never  Vaping Use   Vaping Use: Never used  Substance and  Sexual Activity   Alcohol use: Not Currently   Drug use: Never   Sexual activity: Not Currently  Other Topics Concern   Not on file  Social History Narrative   Not on file   Social Determinants of Health   Financial Resource Strain: Not on file  Food Insecurity: Not on file  Transportation Needs: Not on file  Physical Activity: Not on file  Stress: Not on file  Social Connections: Not on file  Intimate Partner Violence: Not on file    FAMILY HISTORY:  Family History  Problem Relation Age of Onset   Stroke Father    Cirrhosis Mother    Colon cancer Neg Hx     CURRENT MEDICATIONS:  Outpatient Encounter Medications as of 01/19/2021  Medication Sig   aspirin EC 81 MG tablet Take 81 mg by mouth daily.   atorvastatin (LIPITOR) 40 MG tablet Take 1 tablet (40 mg total) by mouth every evening.   ergocalciferol (VITAMIN D2) 1.25 MG (50000 UT) capsule Take 1 capsule (50,000 Units total) by mouth once a week.   ferrous sulfate 325 (65 FE) MG EC tablet Take 1 tablet (325 mg total) by mouth daily with breakfast.   isosorbide mononitrate (IMDUR) 30 MG 24 hr tablet Take 1 tablet (30 mg total) by mouth every evening.   lisinopril (ZESTRIL) 10 MG tablet Take 10 mg by mouth daily. (Patient not taking: Reported on 01/08/2021)   metoprolol succinate (TOPROL-XL) 25 MG 24 hr tablet Take 1 tablet (25 mg total) by mouth every evening.   nitroGLYCERIN (NITROSTAT) 0.4 MG SL tablet Place 1 tablet (0.4 mg total) under the tongue every 5 (five) minutes as needed for chest pain.   sulfamethoxazole-trimethoprim (BACTRIM DS) 800-160 MG tablet Take 1 tablet by mouth every 12 (twelve) hours.   temazepam (RESTORIL) 15 MG capsule Take 1 capsule (15 mg total) by mouth at bedtime as needed for sleep.   Vitamin D, Cholecalciferol, 25 MCG (1000 UT) CAPS Take 1 capsule by mouth daily.   No facility-administered encounter medications on file as of 01/19/2021.    ALLERGIES:  No Known Allergies   PHYSICAL EXAM:   ECOG PERFORMANCE STATUS: 1 - Symptomatic but completely ambulatory  There were no vitals filed for this visit. There were no vitals filed for this visit. Physical Exam Constitutional:      Appearance: Normal appearance.  HENT:     Head: Normocephalic and atraumatic.     Comments: Nasal deformity (right side) following resection of skin cancer    Mouth/Throat:     Mouth: Mucous membranes are moist.  Eyes:     Extraocular Movements: Extraocular movements intact.     Pupils: Pupils are equal, round, and reactive to light.  Cardiovascular:     Rate and Rhythm: Normal rate and regular rhythm.     Pulses: Normal pulses.     Heart sounds: Normal heart sounds.  Pulmonary:     Effort: Pulmonary effort is  normal.     Breath sounds: Normal breath sounds. Decreased air movement present.  Abdominal:     General: Bowel sounds are normal.     Palpations: Abdomen is soft.     Tenderness: There is no abdominal tenderness.  Musculoskeletal:        General: No swelling.     Right lower leg: No edema.     Left lower leg: No edema.  Lymphadenopathy:     Cervical: No cervical adenopathy.  Skin:    General: Skin is warm and dry.  Neurological:     General: No focal deficit present.     Mental Status: He is alert and oriented to person, place, and time.  Psychiatric:        Mood and Affect: Mood normal.        Behavior: Behavior normal.     LABORATORY DATA:  I have reviewed the labs as listed.  CBC    Component Value Date/Time   WBC 8.6 01/12/2021 1031   RBC 4.65 01/12/2021 1031   HGB 12.9 (L) 01/12/2021 1031   HGB 13.8 04/23/2011 0919   HCT 41.2 01/12/2021 1031   HCT 26.0 (L) 05/09/2018 0455   PLT 278 01/12/2021 1031   PLT 208 04/23/2011 0919   MCV 88.6 01/12/2021 1031   MCV 84 04/23/2011 0919   MCH 27.7 01/12/2021 1031   MCHC 31.3 01/12/2021 1031   RDW 13.9 01/12/2021 1031   RDW 12.3 04/23/2011 0919   LYMPHSABS 1.9 01/12/2021 1031   LYMPHSABS 1.1 04/23/2011 0919   MONOABS  0.8 01/12/2021 1031   MONOABS 0.9 (H) 04/23/2011 0919   EOSABS 0.3 01/12/2021 1031   EOSABS 0.0 04/23/2011 0919   BASOSABS 0.1 01/12/2021 1031   BASOSABS 0.0 04/23/2011 0919   CMP Latest Ref Rng & Units 12/14/2020 10/12/2020 07/10/2020  Glucose 70 - 99 mg/dL - 138(H) 131(H)  BUN 8 - 23 mg/dL - 15 21  Creatinine 0.61 - 1.24 mg/dL 1.60(H) 1.40(H) 1.50(H)  Sodium 135 - 145 mmol/L - 134(L) 137  Potassium 3.5 - 5.1 mmol/L - 3.8 3.8  Chloride 98 - 111 mmol/L - 101 104  CO2 22 - 32 mmol/L - 26 26  Calcium 8.9 - 10.3 mg/dL - 8.8(L) 8.8(L)  Total Protein 6.5 - 8.1 g/dL - 7.2 7.5  Total Bilirubin 0.3 - 1.2 mg/dL - 0.2(L) 0.6  Alkaline Phos 38 - 126 U/L - 127(H) 154(H)  AST 15 - 41 U/L - 18 30  ALT 0 - 44 U/L - 14 27    DIAGNOSTIC IMAGING:  I have independently reviewed the relevant imaging and discussed with the patient.  ASSESSMENT & PLAN: 1.  Iron deficiency anemia: - Combination anemia from CKD, malabsorption and likely blood loss. - He has been on intermittent IV iron infusions.  (Last given Venofer x3 in October 2022) - Colonoscopy on 05/31/2017 with 6 subcentimeter polyps removed from the descending colon and one 8 mm polyp in the descending colon.  Multiple large and small polyps located from the area of the hepatic flexure to the cecum along with a polypoid mass which was biopsied.  Pathology consistent with tubular adenomas and hyperplastic polyps. - Hemoccult stool was POSITIVE x3 (June 2022) - upcoming GI appointment in February 2022 - He reports intermittent dark stool with occasional red spots - Symptoms after most recent IV iron are unchanged, as he has persistent fatigue with energy about 25% - Most recent labs (01/12/2021): Hgb 12.9, ferritin 25, iron saturation 9%.  Previously noted mild thrombocytosis has resolved with platelets now 278. - Persistent iron deficiency despite IV repletion is suspicious for ongoing occult blood loss. - PLAN: Recommend additional IV Venofer 300 mg  x 3 doses - Repeat labs and RTC in 3 months. - Referral to gastroenterology due to Hemoccult positive stool (previously seen by Dr. Gala Romney), and patient is scheduled for appointment with GI APP in February 2022 - Patient is aware of alarm signs/symptoms that would prompt immediate medical attention   2.  Locally advanced basal cell carcinoma right nasolabial fold: - Biopsy of the right nasal mucosa on 04/05/2017 with basal cell carcinoma. - Biopsy of the left cheek lesion on 04/02/2017 consistent with basal cell carcinoma. - Resection with skin graft on 06/06/2017.  Margin was close.  Rest of the margins were negative. - 10 rounds of radiation by Dr. Isidore Moos for recurrent basal cell carcinoma to the right medial canthus completed on 03/20/2020.   3.  Vitamin D deficiency - Noted to have decreased vitamin D (22.19) at last visit in June 2022 - Patient was started on vitamin D 1,000 units daily but without improvement - Vitamin D increased to 50,000 units at last visit (10/19/2020), but patient states that he never received it from his pharmacy - Most recent vitamin D (01/12/2021): Remains low at 20.41 - PLAN: Prescription re-sent for vitamin D ergocalciferol 50,000 units weekly.       PLAN SUMMARY & DISPOSITION: Venofer 300 mg x 3 Labs (CBC, iron panel, vitamin D) and office visit in 3 months  All questions were answered. The patient knows to call the clinic with any problems, questions or concerns.  Medical decision making: Moderate  Time spent on visit: I spent 20 minutes counseling the patient face to face. The total time spent in the appointment was 30 minutes and more than 50% was on counseling.   Harriett Rush, PA-C  01/19/2021 2:51 PM

## 2021-01-19 ENCOUNTER — Inpatient Hospital Stay (HOSPITAL_COMMUNITY): Payer: Medicare HMO | Attending: Hematology | Admitting: Physician Assistant

## 2021-01-19 ENCOUNTER — Inpatient Hospital Stay (HOSPITAL_COMMUNITY): Payer: Medicare HMO

## 2021-01-19 ENCOUNTER — Encounter (HOSPITAL_COMMUNITY): Payer: Self-pay | Admitting: Physician Assistant

## 2021-01-19 VITALS — BP 166/108 | HR 100 | Temp 96.9°F | Resp 18 | Wt 167.7 lb

## 2021-01-19 DIAGNOSIS — Z923 Personal history of irradiation: Secondary | ICD-10-CM | POA: Insufficient documentation

## 2021-01-19 DIAGNOSIS — Z7982 Long term (current) use of aspirin: Secondary | ICD-10-CM | POA: Insufficient documentation

## 2021-01-19 DIAGNOSIS — E559 Vitamin D deficiency, unspecified: Secondary | ICD-10-CM | POA: Insufficient documentation

## 2021-01-19 DIAGNOSIS — D631 Anemia in chronic kidney disease: Secondary | ICD-10-CM | POA: Diagnosis not present

## 2021-01-19 DIAGNOSIS — D500 Iron deficiency anemia secondary to blood loss (chronic): Secondary | ICD-10-CM | POA: Diagnosis not present

## 2021-01-19 DIAGNOSIS — Z9079 Acquired absence of other genital organ(s): Secondary | ICD-10-CM | POA: Diagnosis not present

## 2021-01-19 DIAGNOSIS — Z85828 Personal history of other malignant neoplasm of skin: Secondary | ICD-10-CM | POA: Insufficient documentation

## 2021-01-19 DIAGNOSIS — N183 Chronic kidney disease, stage 3 unspecified: Secondary | ICD-10-CM | POA: Insufficient documentation

## 2021-01-19 DIAGNOSIS — Z79899 Other long term (current) drug therapy: Secondary | ICD-10-CM | POA: Insufficient documentation

## 2021-01-19 DIAGNOSIS — K909 Intestinal malabsorption, unspecified: Secondary | ICD-10-CM | POA: Insufficient documentation

## 2021-01-19 DIAGNOSIS — D508 Other iron deficiency anemias: Secondary | ICD-10-CM | POA: Diagnosis not present

## 2021-01-19 DIAGNOSIS — Z23 Encounter for immunization: Secondary | ICD-10-CM | POA: Diagnosis not present

## 2021-01-19 MED ORDER — INFLUENZA VAC A&B SA ADJ QUAD 0.5 ML IM PRSY
0.5000 mL | PREFILLED_SYRINGE | Freq: Once | INTRAMUSCULAR | Status: AC
  Administered 2021-01-19: 11:00:00 0.5 mL via INTRAMUSCULAR
  Filled 2021-01-19: qty 0.5

## 2021-01-19 MED ORDER — ERGOCALCIFEROL 1.25 MG (50000 UT) PO CAPS: 50000.0000 [IU] | ORAL_CAPSULE | ORAL | 6 refills | Status: DC

## 2021-01-19 NOTE — Progress Notes (Signed)
Re-check on BP 166/108.  Pt advised if he is sympathic or feels like he is having stroke like symptoms to go to the ER.

## 2021-01-19 NOTE — Progress Notes (Signed)
Michael [name] presents today for injection per the provider's orders.  Flu shot in right arm administration without incident; injection site WNL; see MAR for injection details.  Patient tolerated procedure well and without incident.  No questions or complaints noted at this time.

## 2021-01-19 NOTE — Patient Instructions (Addendum)
[place] at [facility] Discharge Instructions  You were seen today by Tarri Abernethy PA-C for your iron deficiency anemia.  Your blood counts have improved, but your iron levels remain low.  I suspect that you are continuing to lose blood from somewhere in your GI tract, and it is important that you follow-up with the GI doctors as scheduled in February.  In the meantime, we will schedule you for IV iron infusion x3 doses.  Your blood pressure was high at your visit today.  Please make sure that you do not miss any further doses of your blood pressure medications.  If your blood pressure remains high at home, please call your primary care doctor for adjustment of your blood pressure medications.  Seek immediate medical attention if you experience any chest pain, difficulty breathing, headache, dizziness, blurry vision, or strokelike symptoms.  LABS: Return in 3 months for repeat labs  OTHER TESTS: No other tests at this time  MEDICATIONS: Prescription for vitamin D 50,000 units weekly has been sent to your pharmacy ([facility])  FOLLOW-UP APPOINTMENT: Office visit in 3 months, after labs   Thank you for choosing [place] at [facility] to provide your oncology and hematology care.  To afford each patient quality time with our provider, please arrive at least 15 minutes before your scheduled appointment time.   If you have a lab appointment with the [facility] please come in thru the Main Entrance and check in at the main information desk.  You need to re-schedule your appointment should you arrive 10 or more minutes late.  We strive to give you quality time with our providers, and arriving late affects you and other patients whose appointments are after yours.  Also, if you no show three or more times for appointments you may be dismissed from the clinic at the providers discretion.     Again, thank you for choosing [facility].  Our hope is that these requests will decrease the amount of time that you wait before being seen by our physicians.       _____________________________________________________________  Should you have questions after your visit to [facility], please contact our office at [phone] and follow the prompts.  Our office hours are 8:00 a.m. and 4:30 p.m. Monday - Friday.  Please note that voicemails left after 4:00 p.m. may not be returned until the following business day.  We are closed weekends and major holidays.  You do have access to a nurse 24-7, just call the main number to the clinic [phone] and do not press any options, hold on the line and a nurse will answer the phone.    For prescription refill requests, have your pharmacy contact our office and allow 72 hours.    Due to Covid, you will need to wear a mask upon entering the hospital. If you do not have a mask, a mask will be given to you at the Main Entrance upon arrival. For doctor visits, patients may have 1 support person age 4 or older with them. For treatment visits, patients can not have anyone with them due to social distancing guidelines and our immunocompromised population.

## 2021-01-26 ENCOUNTER — Telehealth: Payer: Self-pay

## 2021-01-26 MED ORDER — CLOPIDOGREL BISULFATE 75 MG PO TABS: 75.0000 mg | ORAL_TABLET | Freq: Every day | ORAL | 6 refills | Status: DC

## 2021-01-26 NOTE — Telephone Encounter (Signed)
Per Dr. Donnetta Hutching, will send prescription to pharmacy for Plavix 75 mg once daily for TCAR protocol. Spoke with patient and informed of this information. Patient verbalized understanding.

## 2021-01-31 ENCOUNTER — Encounter (HOSPITAL_COMMUNITY): Payer: Self-pay | Admitting: Hematology

## 2021-02-01 ENCOUNTER — Encounter (HOSPITAL_COMMUNITY): Payer: Self-pay | Admitting: Hematology

## 2021-02-03 ENCOUNTER — Other Ambulatory Visit: Payer: Self-pay

## 2021-02-03 ENCOUNTER — Inpatient Hospital Stay (HOSPITAL_COMMUNITY): Payer: Medicare Other | Attending: Hematology

## 2021-02-03 VITALS — BP 180/97 | HR 68 | Temp 97.5°F | Resp 18

## 2021-02-03 DIAGNOSIS — K909 Intestinal malabsorption, unspecified: Secondary | ICD-10-CM | POA: Diagnosis not present

## 2021-02-03 DIAGNOSIS — D508 Other iron deficiency anemias: Secondary | ICD-10-CM | POA: Insufficient documentation

## 2021-02-03 DIAGNOSIS — D500 Iron deficiency anemia secondary to blood loss (chronic): Secondary | ICD-10-CM

## 2021-02-03 MED ORDER — SODIUM CHLORIDE 0.9 % IV SOLN
300.0000 mg | Freq: Once | INTRAVENOUS | Status: AC
  Administered 2021-02-03: 300 mg via INTRAVENOUS
  Filled 2021-02-03: qty 15

## 2021-02-03 MED ORDER — ACETAMINOPHEN 325 MG PO TABS
650.0000 mg | ORAL_TABLET | Freq: Once | ORAL | Status: AC
  Administered 2021-02-03: 650 mg via ORAL
  Filled 2021-02-03: qty 2

## 2021-02-03 MED ORDER — SODIUM CHLORIDE 0.9 % IV SOLN: Freq: Once | INTRAVENOUS | Status: AC

## 2021-02-03 MED ORDER — LORATADINE 10 MG PO TABS
10.0000 mg | ORAL_TABLET | Freq: Once | ORAL | Status: AC
  Administered 2021-02-03: 10 mg via ORAL
  Filled 2021-02-03: qty 1

## 2021-02-03 NOTE — Progress Notes (Signed)
Iron infusion given per orders. Patient tolerated it well without problems. Vitals stable and discharged home from clinic ambulatory. Follow up as scheduled.  

## 2021-02-03 NOTE — Patient Instructions (Signed)
[facility]  Discharge Instructions: ?Thank you for choosing [facility] to provide your oncology and hematology care.  ?If you have a lab appointment with the Cancer Center, please come in thru the Main Entrance and check in at the main information desk. ? ? ? ?We strive to give you quality time with your provider. You may need to reschedule your appointment if you arrive late (15 or more minutes).  Arriving late affects you and other patients whose appointments are after yours.  Also, if you miss three or more appointments without notifying the office, you may be dismissed from the clinic at the provider?s discretion.    ?  ?For prescription refill requests, have your pharmacy contact our office and allow 72 hours for refills to be completed.   ? ?  ?To help prevent nausea and vomiting after your treatment, we encourage you to take your nausea medication as directed. ? ?BELOW ARE SYMPTOMS THAT SHOULD BE REPORTED IMMEDIATELY: ?*FEVER GREATER THAN 100.4 F (38 ?C) OR HIGHER ?*CHILLS OR SWEATING ?*NAUSEA AND VOMITING THAT IS NOT CONTROLLED WITH YOUR NAUSEA MEDICATION ?*UNUSUAL SHORTNESS OF BREATH ?*UNUSUAL BRUISING OR BLEEDING ?*URINARY PROBLEMS (pain or burning when urinating, or frequent urination) ?*BOWEL PROBLEMS (unusual diarrhea, constipation, pain near the anus) ?TENDERNESS IN MOUTH AND THROAT WITH OR WITHOUT PRESENCE OF ULCERS (sore throat, sores in mouth, or a toothache) ?UNUSUAL RASH, SWELLING OR PAIN  ?UNUSUAL VAGINAL DISCHARGE OR ITCHING  ? ?Items with * indicate a potential emergency and should be followed up as soon as possible or go to the Emergency Department if any problems should occur. ? ?Please show the CHEMOTHERAPY ALERT CARD or IMMUNOTHERAPY ALERT CARD at check-in to the Emergency Department and triage nurse. ? ?Should you have questions after your visit or need to cancel or reschedule your appointment, please contact [facility] [phone]  and follow  the prompts.  Office hours are 8:00 a.m. to 4:30 p.m. Monday - Friday. Please note that voicemails left after 4:00 p.m. may not be returned until the following business day.  We are closed weekends and major holidays. You have access to a nurse at all times for urgent questions. Please call the main number to the clinic [phone] and follow the prompts. ? ?For any non-urgent questions, you may also contact your provider using MyChart. We now offer e-Visits for anyone 18 and older to request care online for non-urgent symptoms. For details visit [email or web address]. ?  ?Also download the MyChart app! Go to the app store, search "MyChart", open the app, select [facility], and log in with your MyChart username and password. ? ?Due to Covid, a mask is required upon entering the hospital/clinic. If you do not have a mask, one will be given to you upon arrival. For doctor visits, patients may have 1 support person aged 18 or older with them. For treatment visits, patients cannot have anyone with them due to current Covid guidelines and our immunocompromised population.  ?

## 2021-02-04 ENCOUNTER — Telehealth: Payer: Self-pay

## 2021-02-04 ENCOUNTER — Encounter (HOSPITAL_COMMUNITY): Payer: Self-pay | Admitting: Hematology

## 2021-02-04 NOTE — Telephone Encounter (Signed)
Spoke with patient to inform of arrival time change to 0630AM for procedure on Jan 11 at [facility]. Pt verbalized understanding.

## 2021-02-05 NOTE — Pre-Procedure Instructions (Signed)
Surgical Instructions    Your procedure is scheduled on Wednesday 02/10/21.   Report to [facility] Main Entrance "A" at 06:30 A.M., then check in with the Admitting office.  Call this number if you have problems the morning of surgery:  [phone]   If you have any questions prior to your surgery date call [phone]: Open Monday-Friday 8am-4pm    Remember:  Do not eat or drink after midnight the night before your surgery    Take these medicines the morning of surgery with A SIP OF WATER   nitroGLYCERIN (NITROSTAT)- If needed  Please follow your surgeon's instructions regarding clopidogrel (PLAVIX). If you have not received instructions then please contact your surgeon's office for instructions.   As of today, STOP taking any Aspirin (unless otherwise instructed by your surgeon) Aleve, Naproxen, Ibuprofen, Motrin, Advil, Goody's, BC's, all herbal medications, fish oil, and all vitamins.                     Do NOT Smoke (Tobacco/Vaping) or drink Alcohol 24 hours prior to your procedure.  If you use a CPAP at night, you may bring all equipment for your overnight stay.   Contacts, glasses, piercing's, hearing aid's, dentures or partials may not be worn into surgery, please bring cases for these belongings.    For patients admitted to the hospital, discharge time will be determined by your treatment team.   Patients discharged the day of surgery will not be allowed to drive home, and someone needs to stay with them for 24 hours.  NO VISITORS WILL BE ALLOWED IN PRE-OP WHERE PATIENTS GET READY FOR SURGERY.  ONLY 1 SUPPORT PERSON MAY BE PRESENT IN THE WAITING ROOM WHILE YOU ARE IN SURGERY.  IF YOU ARE TO BE ADMITTED, ONCE YOU ARE IN YOUR ROOM YOU WILL BE ALLOWED TWO (2) VISITORS.  Minor children may have two parents present. Special consideration for safety and communication needs will be reviewed on a case by case basis.   Special instructions:   [facility]- Preparing For  Surgery  Before surgery, you can play an important role. Because skin is not sterile, your skin needs to be as free of germs as possible. You can reduce the number of germs on your skin by washing with CHG (chlorahexidine gluconate) Soap before surgery.  CHG is an antiseptic cleaner which kills germs and bonds with the skin to continue killing germs even after washing.    Oral Hygiene is also important to reduce your risk of infection.  Remember - BRUSH YOUR TEETH THE MORNING OF SURGERY WITH YOUR REGULAR TOOTHPASTE  Please do not use if you have an allergy to CHG or antibacterial soaps. If your skin becomes reddened/irritated stop using the CHG.  Do not shave (including legs and underarms) for at least 48 hours prior to first CHG shower. It is OK to shave your face.  Please follow these instructions carefully.   Shower the NIGHT BEFORE SURGERY and the MORNING OF SURGERY  If you chose to wash your hair, wash your hair first as usual with your normal shampoo.  After you shampoo, rinse your hair and body thoroughly to remove the shampoo.  Use CHG Soap as you would any other liquid soap. You can apply CHG directly to the skin and wash gently with a scrungie or a clean washcloth.   Apply the CHG Soap to your body ONLY FROM THE NECK DOWN.  Do not use on open wounds or open sores.  Avoid contact with your eyes, ears, mouth and genitals (private parts). Wash Face and genitals (private parts)  with your normal soap.   Wash thoroughly, paying special attention to the area where your surgery will be performed.  Thoroughly rinse your body with warm water from the neck down.  DO NOT shower/wash with your normal soap after using and rinsing off the CHG Soap.  Pat yourself dry with a CLEAN TOWEL.  Wear CLEAN PAJAMAS to bed the night before surgery  Place CLEAN SHEETS on your bed the night before your surgery  DO NOT SLEEP WITH PETS.   Day of Surgery: Shower with CHG soap. Do not wear jewelry,  make up, nail polish, gel polish, artificial nails, or any other type of covering on natural nails including finger and toenails. If patients have artificial nails, gel coating, etc. that need to be removed by a nail salon please have this removed prior to surgery. Surgery may need to be canceled/delayed if the surgeon/ anesthesia feels like the patient is unable to be adequately monitored. Do not wear lotions, powders, perfumes/colognes, or deodorant. Do not shave 48 hours prior to surgery.  Men may shave face and neck. Do not bring valuables to the hospital. [facility] is not responsible for any belongings or valuables. Wear Clean/Comfortable clothing the morning of surgery Remember to brush your teeth WITH YOUR REGULAR TOOTHPASTE.   Please read over the following fact sheets that you were given.   3 days prior to your procedure or After your COVID test   You are not required to quarantine however you are required to wear a well-fitting mask when you are out and around people not in your household. If your mask becomes wet or soiled, replace with a new one.   Wash your hands often with soap and water for 20 seconds or clean your hands with an alcohol-based hand sanitizer that contains at least 60% alcohol.   Do not share personal items.   Notify your provider:  o if you are in close contact with someone who has COVID  o or if you develop a fever of 100.4 or greater, sneezing, cough, sore throat, shortness of breath or body aches.

## 2021-02-08 ENCOUNTER — Encounter (HOSPITAL_COMMUNITY): Payer: Self-pay | Admitting: Hematology

## 2021-02-08 ENCOUNTER — Encounter (HOSPITAL_COMMUNITY)
Admission: RE | Admit: 2021-02-08 | Discharge: 2021-02-08 | Disposition: A | Discharge status: Home / Self Care | Payer: Medicare Other | Source: Ambulatory Visit | Attending: Vascular Surgery | Admitting: Vascular Surgery

## 2021-02-08 DIAGNOSIS — H25813 Combined forms of age-related cataract, bilateral: Secondary | ICD-10-CM | POA: Diagnosis not present

## 2021-02-09 ENCOUNTER — Other Ambulatory Visit: Payer: Self-pay

## 2021-02-09 ENCOUNTER — Encounter (HOSPITAL_COMMUNITY): Payer: Self-pay

## 2021-02-09 ENCOUNTER — Encounter (HOSPITAL_COMMUNITY)
Admission: RE | Admit: 2021-02-09 | Discharge: 2021-02-09 | Disposition: A | Discharge status: Home / Self Care | Payer: Medicare Other | Source: Ambulatory Visit | Attending: Vascular Surgery | Admitting: Vascular Surgery

## 2021-02-09 ENCOUNTER — Encounter (HOSPITAL_COMMUNITY): Payer: Self-pay | Admitting: Vascular Surgery

## 2021-02-09 VITALS — BP 159/100 | HR 78 | Temp 97.7°F | Resp 17 | Ht 72.0 in | Wt 167.5 lb

## 2021-02-09 DIAGNOSIS — F17210 Nicotine dependence, cigarettes, uncomplicated: Secondary | ICD-10-CM | POA: Diagnosis not present

## 2021-02-09 DIAGNOSIS — I5032 Chronic diastolic (congestive) heart failure: Secondary | ICD-10-CM | POA: Insufficient documentation

## 2021-02-09 DIAGNOSIS — Z79899 Other long term (current) drug therapy: Secondary | ICD-10-CM | POA: Diagnosis not present

## 2021-02-09 DIAGNOSIS — Z9079 Acquired absence of other genital organ(s): Secondary | ICD-10-CM | POA: Insufficient documentation

## 2021-02-09 DIAGNOSIS — I130 Hypertensive heart and chronic kidney disease with heart failure and stage 1 through stage 4 chronic kidney disease, or unspecified chronic kidney disease: Secondary | ICD-10-CM | POA: Insufficient documentation

## 2021-02-09 DIAGNOSIS — Z20822 Contact with and (suspected) exposure to covid-19: Secondary | ICD-10-CM | POA: Diagnosis not present

## 2021-02-09 DIAGNOSIS — I2510 Atherosclerotic heart disease of native coronary artery without angina pectoris: Secondary | ICD-10-CM | POA: Diagnosis not present

## 2021-02-09 DIAGNOSIS — Z7982 Long term (current) use of aspirin: Secondary | ICD-10-CM | POA: Diagnosis not present

## 2021-02-09 DIAGNOSIS — Z8546 Personal history of malignant neoplasm of prostate: Secondary | ICD-10-CM | POA: Diagnosis not present

## 2021-02-09 DIAGNOSIS — N183 Chronic kidney disease, stage 3 unspecified: Secondary | ICD-10-CM | POA: Diagnosis not present

## 2021-02-09 DIAGNOSIS — Z01818 Encounter for other preprocedural examination: Secondary | ICD-10-CM

## 2021-02-09 DIAGNOSIS — D509 Iron deficiency anemia, unspecified: Secondary | ICD-10-CM | POA: Insufficient documentation

## 2021-02-09 DIAGNOSIS — I252 Old myocardial infarction: Secondary | ICD-10-CM | POA: Insufficient documentation

## 2021-02-09 DIAGNOSIS — I6523 Occlusion and stenosis of bilateral carotid arteries: Secondary | ICD-10-CM | POA: Diagnosis not present

## 2021-02-09 DIAGNOSIS — Z85828 Personal history of other malignant neoplasm of skin: Secondary | ICD-10-CM | POA: Insufficient documentation

## 2021-02-09 DIAGNOSIS — J449 Chronic obstructive pulmonary disease, unspecified: Secondary | ICD-10-CM | POA: Insufficient documentation

## 2021-02-09 DIAGNOSIS — Z01812 Encounter for preprocedural laboratory examination: Secondary | ICD-10-CM | POA: Diagnosis not present

## 2021-02-09 LAB — URINALYSIS, ROUTINE W REFLEX MICROSCOPIC
Bilirubin Urine: NEGATIVE
Glucose, UA: NEGATIVE mg/dL
Ketones, ur: NEGATIVE mg/dL
Nitrite: POSITIVE — AB
Protein, ur: NEGATIVE mg/dL
Specific Gravity, Urine: 1.025 (ref 1.005–1.030)
pH: 6 (ref 5.0–8.0)

## 2021-02-09 LAB — CBC
HCT: 39.7 % (ref 39.0–52.0)
Hemoglobin: 12.5 g/dL — ABNORMAL LOW (ref 13.0–17.0)
MCH: 27.4 pg (ref 26.0–34.0)
MCHC: 31.5 g/dL (ref 30.0–36.0)
MCV: 86.9 fL (ref 80.0–100.0)
Platelets: 360 10*3/uL (ref 150–400)
RBC: 4.57 MIL/uL (ref 4.22–5.81)
RDW: 13.7 % (ref 11.5–15.5)
WBC: 12.5 10*3/uL — ABNORMAL HIGH (ref 4.0–10.5)
nRBC: 0 % (ref 0.0–0.2)

## 2021-02-09 LAB — COMPREHENSIVE METABOLIC PANEL
ALT: 27 U/L (ref 0–44)
AST: 29 U/L (ref 15–41)
Albumin: 3.7 g/dL (ref 3.5–5.0)
Alkaline Phosphatase: 164 U/L — ABNORMAL HIGH (ref 38–126)
Anion gap: 8 (ref 5–15)
BUN: 20 mg/dL (ref 8–23)
CO2: 25 mmol/L (ref 22–32)
Calcium: 8.7 mg/dL — ABNORMAL LOW (ref 8.9–10.3)
Chloride: 104 mmol/L (ref 98–111)
Creatinine, Ser: 1.56 mg/dL — ABNORMAL HIGH (ref 0.61–1.24)
GFR, Estimated: 49 mL/min — ABNORMAL LOW (ref >60)
Glucose, Bld: 90 mg/dL (ref 70–99)
Potassium: 3.2 mmol/L — ABNORMAL LOW (ref 3.5–5.1)
Sodium: 137 mmol/L (ref 135–145)
Total Bilirubin: 0.5 mg/dL (ref 0.3–1.2)
Total Protein: 7.5 g/dL (ref 6.5–8.1)

## 2021-02-09 LAB — TYPE AND SCREEN
ABO/RH(D): O POS
Antibody Screen: NEGATIVE

## 2021-02-09 LAB — SURGICAL PCR SCREEN
MRSA, PCR: NEGATIVE
Staphylococcus aureus: NEGATIVE

## 2021-02-09 LAB — PROTIME-INR
INR: 1.1 (ref 0.8–1.2)
Prothrombin Time: 14.1 seconds (ref 11.4–15.2)

## 2021-02-09 LAB — URINALYSIS, MICROSCOPIC (REFLEX): WBC, UA: >50 WBC/hpf (ref 0–5)

## 2021-02-09 LAB — SARS CORONAVIRUS 2 (TAT 6-24 HRS): SARS Coronavirus 2: NEGATIVE

## 2021-02-09 LAB — APTT: aPTT: 36 seconds (ref 24–36)

## 2021-02-09 NOTE — Anesthesia Preprocedure Evaluation (Deleted)
Anesthesia Evaluation    Airway        Dental   Pulmonary COPD, Current Smoker,           Cardiovascular hypertension, + CAD and +CHF       Neuro/Psych Carotid stenosis negative neurological ROS  negative psych ROS   GI/Hepatic negative GI ROS, Neg liver ROS,   Endo/Other  negative endocrine ROS  Renal/GU CRFRenal disease  negative genitourinary   Musculoskeletal   Abdominal   Peds  Hematology  (+) anemia ,   Anesthesia Other Findings   Reproductive/Obstetrics                            Anesthesia Physical Anesthesia Plan  ASA: 3  Anesthesia Plan: General   Post-op Pain Management:    Induction: Intravenous  PONV Risk Score and Plan: 1 and Ondansetron, Dexamethasone, Midazolam and Treatment may vary due to age or medical condition  Airway Management Planned: Mask and Oral ETT  Additional Equipment: Arterial line  Intra-op Plan:   Post-operative Plan: Extubation in OR  Informed Consent:   Plan Discussed with:   Anesthesia Plan Comments: (See PAT note written 02/09/2021 by Myra Gianotti, PA-C. Lab Results      Component                Value               Date                      WBC                      12.5 (H)            02/09/2021                HGB                      12.5 (L)            02/09/2021                HCT                      39.7                02/09/2021                MCV                      86.9                02/09/2021                PLT                      360                 02/09/2021           Lab Results      Component                Value               Date                      NA  137                 02/09/2021                K                        3.2 (L)             02/09/2021                CO2                      25                  02/09/2021                GLUCOSE                  90                  02/09/2021                 BUN                      20                  02/09/2021                CREATININE               1.56 (H)            02/09/2021                CALCIUM                  8.7 (L)             02/09/2021                GFRNONAA                 49 (L)              02/09/2021           Echo 12/05/2017 Study Conclusions - Left ventricle: The cavity size was normal. Wall thickness was increased in a pattern of mild LVH. Systolic function was mildly reduced. The estimated ejection fraction was in the range of 45% to 50%. There is hypokinesis of the basal-midinferolateral myocardium. Features are consistent with a pseudonormal left ventricular filling pattern, with concomitant abnormal relaxation and increased filling pressure (grade 2 diastolic dysfunction). - Aortic valve: Mildly calcified annulus. Trileaflet; mildly calcified leaflets. - Mitral valve: Mildly calcified annulus. There was mild regurgitation. - Left atrium: The atrium was mildly dilated. - Tricuspid valve: There was trivial regurgitation. - Pulmonary arteries: Systolic pressure could not be accurately estimated. - Pericardium, extracardiac: A trivial pericardial effusion was identified anterior to the heart.  Nuclear stress test 10/19/17  ? Findings consistent with prior inferior/septal myocardial infarction with mild peri-infarct ischemia. ? This is a high risk study. Risk primarily based on decreased LVEF. There is a prior infarct with only mild current ischemia. Consider correlating LVEF with echocardiogram. ? The left ventricular ejection fraction is moderately decreased (33%). - Echocardiogram 12/05/17 showed LVEF 45-50%. Patient without angina. Medical therapy recommended. See 01/10/18 note by Dr. Domenic Polite. )       Anesthesia Quick Evaluation

## 2021-02-09 NOTE — Progress Notes (Addendum)
PCP - Allyn Kenner, MD Cardiologist - Rozann Lesches, MD  PPM/ICD - denies Device Orders - n/a Rep Notified - n/a  Chest x-ray - n/a EKG - 01/08/2021 Stress Test - 10/19/2017 ECHO - 12/05/2017 Cardiac Cath - denies  Sleep Study - denies CPAP - n/a  Fasting Blood Sugar - n/a  Blood Thinner Instructions: Plavix - patient verbalized that he didn't take Plavix. Ebony Hail, PA-C made aware  Aspirin Instructions: Aspirin - patient will not hold Aspirin prior to surgery  Patient was instructed: As of today, STOP taking any Aspirin (unless otherwise instructed by your surgeon) Aleve, Naproxen, Ibuprofen, Motrin, Advil, Goody's, BC's, all herbal medications, fish oil, and all vitamins.           ERAS Protcol - n/a   COVID TEST- done in PAT on 02/09/2021   Anesthesia review: yes - the chart was reviewed by Ebony Hail, PA-C   BP elevated in PAT 160/103. Patient verbalized that his SBP is in general high, between 160-200 and DBP also elevated. Patient is taking BP medicine at bedtime. Patient denies any distress. Patient was instructed to take his BP medicine tonight and to monitor his BP. Patient verbalized understanding.   Patient denies shortness of breath, fever, cough and chest pain at PAT appointment   All instructions explained to the patient, with a verbal understanding of the material. Patient agrees to go over the instructions while at home for a better understanding. Patient also instructed to self quarantine after being tested for COVID-19. The opportunity to ask questions was provided.

## 2021-02-09 NOTE — Progress Notes (Addendum)
Anesthesia Chart Review:  Case: [id number] Date/Time: 02/10/21 0815   Procedure: RIGHT TRANSCAROTID ARTERY REVASCULARIZATION (Right)   Anesthesia type: General   Pre-op diagnosis: right carotid stenosis   Location: [facility] OR ROOM 16 / [place] OR   Surgeons: Michael Heck, MD       DISCUSSION: Patient is a 66 year old male scheduled for the above procedure. PAT 02/09/21, as he missed his 02/08/21 visit.   History includes smoking, COPD, HTN, chronic diastolic CHF, CAD (prior inferior/septal MI by 10/2017 nuclear stress test, EF 45-50% by 12/2017, medical therapy recommended), carotid artery disease, CKD (stage III), BPH with retention (s/p robotic assisted laparoscopic simple prostatectomy with cystolithotomy 10/29/18), iron deficiency anemia, skin cancer (BCC involving right face/nose, s/p right partial rhinectomy, partial maxillectomy, wide local excision of left facial lesion, left intranasal mass resection, right neck exploration, left radial forearm osteocutaneous flap, and left thigh STSG on 06/06/17 at [facility]).  Last cardiology evaluation by Dr. Domenic [name] was on 01/08/21.  Patient did not describe any anginal symptoms.  He noted recent CTA with severe R ICA stenosis with with upcoming vascular surgery evaluation to discuss management.  Follow-up in 6 months planned.  I called and spoke with Dr. Carlis [name] while Michael [name] was at his PAT visit. Although on 01/26/21, he had been instructed to take Plavix for upcoming TCAR, he never picked it up indicating he did not understand that he needed to start it before surgery. He reported continuing ASA and statin medication. Also notified Dr. Carlis [name] that patient's BP appears poorly controlled with initial reading at PAT 160/103. Patient indicated this was typical for [name]. He takes Imdur, Toprol, and lisinopril at night and will take tonight and as directed. Dr. Carlis [name] advised that Mr. Blick continue ASA and statin as prescribed, pick up Plavix (so he will have it  available to take after discharge), and plan for Plavix load of 300 mg on the morning of surgery. (He is aware that orders will need to be entered.) I also communicated with [facility] RN Michael [name] that UA was abnormal, suggestive of UTI. She will review with Dr. Carlis [name] to determine treatment recommendations and if result could interfere with timing for surgery.    02/09/21 presurgical COVID-19 test is in process. Anesthesia team to evaluate on the day of surgery.    VS: BP (!) 159/100    Pulse 78    Temp 36.5 C    Resp 17    Ht 6' (1.829 m)    Wt 76 kg    SpO2 100%    BMI 22.72 kg/m  BP 160/103-->159/100 BP Readings from Last 3 Encounters:  02/09/21 (!) 160/103  02/03/21 (!) 180/97  01/19/21 (!) 166/108   Pulse Readings from Last 3 Encounters:  02/09/21 80  02/03/21 68  01/19/21 100     PROVIDERS: Michael Squibb, MD is PCP Michael Lesches, MD is Cardiologist  Michael Jack, MD is Michael Him, MD is urologist   LABS: Preoperative labs noted. See DISCUSSION. Cr stable at 1.56.  (all labs ordered are listed, but only abnormal results are displayed)  Labs Reviewed  CBC - Abnormal; Notable for the following components:      Result Value   WBC 12.5 (*)    Hemoglobin 12.5 (*)    All other components within normal limits  COMPREHENSIVE METABOLIC PANEL - Abnormal; Notable for the following components:   Potassium 3.2 (*)    Creatinine, Ser 1.56 (*)    Calcium 8.7 (*)  Alkaline Phosphatase 164 (*)    GFR, Estimated 49 (*)    All other components within normal limits  URINALYSIS, ROUTINE W REFLEX MICROSCOPIC - Abnormal; Notable for the following components:   Hgb urine dipstick TRACE (*)    Nitrite POSITIVE (*)    Leukocytes,Ua SMALL (*)    All other components within normal limits  URINALYSIS, MICROSCOPIC (REFLEX) - Abnormal; Notable for the following components:   Bacteria, UA MANY (*)    All other components within normal limits  SARS CORONAVIRUS 2 (TAT 6-24 HRS)   SURGICAL PCR SCREEN  PROTIME-INR  APTT  TYPE AND SCREEN     IMAGES: CTA Neck 12/14/20: IMPRESSION: 1. Bulky calcified plaque at the Right ICA origin and bulb has mildly progressed since 2020 with high-grade stenosis approaching a RADIOGRAPHIC STRING SIGN over a segment of 7-8 mm. 2. Soft and calcified plaque of the cervical left carotid without significant stenosis. 3. Dominant Right Vertebral Artery with stable moderate tandem stenoses in the neck, moderate to severe right V4 segment stenosis just proximal to PICA. But the non dominant and diminutive Left Vertebral Artery is atherosclerotic and now FUNCTIONALLY OCCLUDED distal to the Left PICA origin, new since 2020. 4. Aortic Atherosclerosis (ICD10-I70.0).   EKG: 01/08/21 ([facility]): Sinus rhythm with first-degree AV block with premature supraventricular complexes with occasional premature ventricular complexes.  Left ventricular hypertrophy with QRS widening and repolarization abnormality.   CV: US Carotid 11/09/20 Summary:  - Right Carotid: Velocities in the right ICA are consistent with a 80-99% stenosis. The ECA appears >50% stenosed.  - Left Carotid: Velocities in the left ICA are consistent with a 1-39% stenosis.  - Vertebrals:  Bilateral vertebral arteries demonstrate antegrade flow.  - Subclavians: Normal flow hemodynamics were seen in bilateral subclavian arteries.    Echo 12/05/2017 Study Conclusions - Left ventricle: The cavity size was normal. Wall thickness was   increased in a pattern of mild LVH. Systolic function was mildly   reduced. The estimated ejection fraction was in the range of 45%   to 50%. There is hypokinesis of the basal-midinferolateral   myocardium. Features are consistent with a pseudonormal left   ventricular filling pattern, with concomitant abnormal relaxation   and increased filling pressure (grade 2 diastolic dysfunction). - Aortic valve: Mildly calcified annulus. Trileaflet;  mildly   calcified leaflets. - Mitral valve: Mildly calcified annulus. There was mild   regurgitation. - Left atrium: The atrium was mildly dilated. - Tricuspid valve: There was trivial regurgitation. - Pulmonary arteries: Systolic pressure could not be accurately   estimated. - Pericardium, extracardiac: A trivial pericardial effusion was   identified anterior to the heart.   Nuclear stress test 10/19/17  Findings consistent with prior inferior/septal myocardial infarction with mild peri-infarct ischemia. This is a high risk study. Risk primarily based on decreased LVEF. There is a prior infarct with only mild current ischemia. Consider correlating LVEF with echocardiogram. The left ventricular ejection fraction is moderately decreased (33%). - Echocardiogram 12/05/17 showed LVEF 45-50%. Patient without angina. Medical therapy recommended. See 01/10/18 note by Dr. Domenic [name].   Past Medical History:  Diagnosis Date   Carotid artery disease (HCC)    Chronic diastolic heart failure (HCC)    CKD (chronic kidney disease) stage 3, GFR 30-59 ml/min (HCC)    COPD (chronic obstructive pulmonary disease) (HCC)    Coronary artery disease    Essential hypertension    Head and neck cancer ([place]) 2019   Right facial basal cell carcinoma s/p resection  with right partial mastectomy and partal rhinectomy with skin graft (06/13/17)   Iron deficiency anemia    Urinary retention     Past Surgical History:  Procedure Laterality Date   ANKLE CLOSED REDUCTION Right    open reduction   BASAL CELL CARCINOMA EXCISION  2019   at [facility]   COLONOSCOPY N/A 05/31/2017   Procedure: COLONOSCOPY;  Surgeon: Daneil Dolin, MD;  Location: [facility];  Service: Endoscopy;  Laterality: N/A;  2:45pm   CYSTOSCOPY N/A 10/29/2018   Procedure: CYSTOSCOPY, CLOT EVACUATION;  Surgeon: Ceasar Mons, MD;  Location: [facility] ORS;  Service: Urology;  Laterality: N/A;   POLYPECTOMY  05/31/2017   Procedure: POLYPECTOMY;   Surgeon: Daneil Dolin, MD;  Location: [facility];  Service: Endoscopy;;   TONSILLECTOMY     XI ROBOTIC ASSISTED SIMPLE PROSTATECTOMY N/A 10/29/2018   Procedure: XI ROBOTIC ASSISTED SIMPLE PROSTATECTOMY;  Surgeon: Cleon Gustin, MD;  Location: [facility] ORS;  Service: Urology;  Laterality: N/A;    MEDICATIONS:  aspirin EC 81 MG tablet   atorvastatin (LIPITOR) 40 MG tablet   clopidogrel (PLAVIX) 75 MG tablet   ergocalciferol (VITAMIN D2) 1.25 MG (50000 UT) capsule   ferrous sulfate 325 (65 FE) MG EC tablet   isosorbide mononitrate (IMDUR) 30 MG 24 hr tablet   lisinopril (ZESTRIL) 10 MG tablet   metoprolol succinate (TOPROL-XL) 25 MG 24 hr tablet   nitroGLYCERIN (NITROSTAT) 0.4 MG SL tablet   No current facility-administered medications for this encounter.    Myra Gianotti, PA-C Surgical Short Stay/Anesthesiology [facility] Phone [phone] [facility] Phone [phone] 02/09/2021 4:46 PM

## 2021-02-09 NOTE — Progress Notes (Signed)
Abnormal UA in PAT: Bacteria, UA - many; Leukocytes, UA - small; Nitrite, UA - positive; Hgb urine dipstick - trace.;  The result was reported to Clinton Sawyer, RN - Dr. Carlis Abbott office.

## 2021-02-10 ENCOUNTER — Encounter (HOSPITAL_COMMUNITY)
Admission: RE | Disposition: A | Discharge status: Home / Self Care | Payer: Self-pay | Source: Home / Self Care | Attending: Vascular Surgery

## 2021-02-10 ENCOUNTER — Other Ambulatory Visit: Payer: Self-pay | Admitting: Vascular Surgery

## 2021-02-10 ENCOUNTER — Ambulatory Visit (HOSPITAL_COMMUNITY)
Admission: RE | Admit: 2021-02-10 | Discharge: 2021-02-10 | Disposition: A | Discharge status: Home / Self Care | Payer: Commercial Managed Care - HMO | Attending: Vascular Surgery | Admitting: Vascular Surgery

## 2021-02-10 SURGERY — TRANSCAROTID ARTERY REVASCULARIZATION (TCAR)
Anesthesia: General | Laterality: Right

## 2021-02-10 MED ORDER — ROCURONIUM BROMIDE 10 MG/ML (PF) SYRINGE
PREFILLED_SYRINGE | INTRAVENOUS | Status: AC
  Filled 2021-02-10: qty 10

## 2021-02-10 MED ORDER — LIDOCAINE 2% (20 MG/ML) 5 ML SYRINGE
INTRAMUSCULAR | Status: AC
  Filled 2021-02-10: qty 5

## 2021-02-10 MED ORDER — SULFAMETHOXAZOLE-TRIMETHOPRIM 800-160 MG PO TABS: 1.0000 | ORAL_TABLET | Freq: Two times a day (BID) | ORAL | 0 refills | Status: DC

## 2021-02-10 MED ORDER — PROPOFOL 10 MG/ML IV BOLUS
INTRAVENOUS | Status: AC
  Filled 2021-02-10: qty 20

## 2021-02-10 MED ORDER — FENTANYL CITRATE (PF) 250 MCG/5ML IJ SOLN
INTRAMUSCULAR | Status: AC
  Filled 2021-02-10: qty 5

## 2021-02-10 NOTE — Progress Notes (Signed)
Patient scheduled for right TCAR for an asymptomatic high-grade stenosis.  He arrived about an hour late to preop.  I made multiple attempts to contact him yesterday after we got his preop labs.  White count elevated at 12 with a positive UA including bacteria with positive nitrites and leukocytes.  In addition patient has not picked up his Plavix prescription and has not been compliant with Plavix prior to surgery.  I have advised that we reschedule him for Monday given the elective nature of this operation.  I have sent a prescription for Bactrim to his pharmacy for the next 5 days.  Also advised that he pick up his Plavix prescription today and take Plavix aspirin and statin daily.  I will reach out to my office to schedule him for Monday which I discussed with the patient.  [name] Heck, MD [facility] [place] Office: [place]

## 2021-02-10 NOTE — Progress Notes (Signed)
Patient states that his phone is currently not working.  He said that we could contact his [employer] (landlord) but he was not able to provide a telephone number.  Patient states we can also call [employer].

## 2021-02-10 NOTE — Progress Notes (Signed)
Patient was scheduled to arrive at [facility] but had not shown.  Multiple attempts to reach patient without success.  Dr. Carlis Abbott aware and stated he attempted to reach patient the day prior due UA and patient stated that he had not taken his Plavix.     Patient arrived to preop at 0730.  Dr. Carlis Abbott at bedside to talk with patient about rescheduling procedure, urine, and plavix instructions.    OR and CRNA aware.

## 2021-02-11 ENCOUNTER — Encounter (HOSPITAL_COMMUNITY): Payer: Self-pay | Admitting: Hematology

## 2021-02-12 ENCOUNTER — Encounter (HOSPITAL_COMMUNITY): Payer: Self-pay | Admitting: Hematology

## 2021-02-12 NOTE — Progress Notes (Signed)
Called patient this morning to give him time of arrival of 7:30 AM, Monday 02/15/21. I asked him if he had gotten his Plavix Rx and antibiotic Rx filled and he said he's had his antibiotic for 2 days and taking them, but Plavix is coming today. He states he will start taking it today.  Instructed pt to follow all instructions that were given to him at his PAT appointment. He said he still has the CHG soap to  use.  Covid test on arrival.

## 2021-02-15 ENCOUNTER — Encounter (HOSPITAL_COMMUNITY): Payer: Self-pay | Admitting: Vascular Surgery

## 2021-02-15 ENCOUNTER — Inpatient Hospital Stay (HOSPITAL_COMMUNITY): Payer: Medicare Other | Admitting: General Practice

## 2021-02-15 ENCOUNTER — Inpatient Hospital Stay (HOSPITAL_COMMUNITY): Payer: Medicare Other

## 2021-02-15 ENCOUNTER — Ambulatory Visit (HOSPITAL_COMMUNITY): Payer: Medicare HMO

## 2021-02-15 ENCOUNTER — Inpatient Hospital Stay (HOSPITAL_COMMUNITY)
Admission: RE | Admit: 2021-02-15 | Discharge: 2021-02-18 | DRG: 035 | Disposition: A | Discharge status: Home / Self Care | Payer: Medicare Other | Attending: Vascular Surgery | Admitting: Vascular Surgery

## 2021-02-15 ENCOUNTER — Other Ambulatory Visit: Payer: Self-pay

## 2021-02-15 ENCOUNTER — Encounter (HOSPITAL_COMMUNITY)
Admission: RE | Disposition: A | Discharge status: Home / Self Care | Payer: Self-pay | Source: Home / Self Care | Attending: Vascular Surgery

## 2021-02-15 DIAGNOSIS — D631 Anemia in chronic kidney disease: Secondary | ICD-10-CM | POA: Diagnosis not present

## 2021-02-15 DIAGNOSIS — J449 Chronic obstructive pulmonary disease, unspecified: Secondary | ICD-10-CM | POA: Diagnosis present

## 2021-02-15 DIAGNOSIS — I6521 Occlusion and stenosis of right carotid artery: Secondary | ICD-10-CM | POA: Insufficient documentation

## 2021-02-15 DIAGNOSIS — Z006 Encounter for examination for normal comparison and control in clinical research program: Secondary | ICD-10-CM | POA: Diagnosis not present

## 2021-02-15 DIAGNOSIS — Z20822 Contact with and (suspected) exposure to covid-19: Secondary | ICD-10-CM | POA: Diagnosis present

## 2021-02-15 DIAGNOSIS — I130 Hypertensive heart and chronic kidney disease with heart failure and stage 1 through stage 4 chronic kidney disease, or unspecified chronic kidney disease: Secondary | ICD-10-CM | POA: Diagnosis present

## 2021-02-15 DIAGNOSIS — N179 Acute kidney failure, unspecified: Secondary | ICD-10-CM | POA: Diagnosis not present

## 2021-02-15 DIAGNOSIS — F17200 Nicotine dependence, unspecified, uncomplicated: Secondary | ICD-10-CM | POA: Diagnosis not present

## 2021-02-15 DIAGNOSIS — I5032 Chronic diastolic (congestive) heart failure: Secondary | ICD-10-CM | POA: Diagnosis not present

## 2021-02-15 DIAGNOSIS — Z823 Family history of stroke: Secondary | ICD-10-CM | POA: Diagnosis not present

## 2021-02-15 DIAGNOSIS — I509 Heart failure, unspecified: Secondary | ICD-10-CM | POA: Diagnosis not present

## 2021-02-15 DIAGNOSIS — I6523 Occlusion and stenosis of bilateral carotid arteries: Secondary | ICD-10-CM

## 2021-02-15 DIAGNOSIS — I2510 Atherosclerotic heart disease of native coronary artery without angina pectoris: Secondary | ICD-10-CM | POA: Diagnosis not present

## 2021-02-15 DIAGNOSIS — R339 Retention of urine, unspecified: Secondary | ICD-10-CM | POA: Diagnosis not present

## 2021-02-15 DIAGNOSIS — Z85828 Personal history of other malignant neoplasm of skin: Secondary | ICD-10-CM

## 2021-02-15 DIAGNOSIS — Z8589 Personal history of malignant neoplasm of other organs and systems: Secondary | ICD-10-CM

## 2021-02-15 DIAGNOSIS — F17210 Nicotine dependence, cigarettes, uncomplicated: Secondary | ICD-10-CM | POA: Diagnosis present

## 2021-02-15 DIAGNOSIS — Z7982 Long term (current) use of aspirin: Secondary | ICD-10-CM | POA: Diagnosis not present

## 2021-02-15 DIAGNOSIS — N1831 Chronic kidney disease, stage 3a: Secondary | ICD-10-CM | POA: Diagnosis present

## 2021-02-15 DIAGNOSIS — Z79899 Other long term (current) drug therapy: Secondary | ICD-10-CM | POA: Diagnosis not present

## 2021-02-15 DIAGNOSIS — N189 Chronic kidney disease, unspecified: Secondary | ICD-10-CM | POA: Diagnosis not present

## 2021-02-15 HISTORY — PX: ULTRASOUND GUIDANCE FOR VASCULAR ACCESS: SHX6516

## 2021-02-15 HISTORY — PX: TRANSCAROTID ARTERY REVASCULARIZATIONÂ: SHX6778

## 2021-02-15 LAB — CBC
HCT: 33.3 % — ABNORMAL LOW (ref 39.0–52.0)
Hemoglobin: 10.8 g/dL — ABNORMAL LOW (ref 13.0–17.0)
MCH: 27.6 pg (ref 26.0–34.0)
MCHC: 32.4 g/dL (ref 30.0–36.0)
MCV: 85.2 fL (ref 80.0–100.0)
Platelets: 269 10*3/uL (ref 150–400)
RBC: 3.91 MIL/uL — ABNORMAL LOW (ref 4.22–5.81)
RDW: 14.1 % (ref 11.5–15.5)
WBC: 10.3 10*3/uL (ref 4.0–10.5)
nRBC: 0 % (ref 0.0–0.2)

## 2021-02-15 LAB — SARS CORONAVIRUS 2 BY RT PCR (HOSPITAL ORDER, PERFORMED IN ~~LOC~~ HOSPITAL LAB): SARS Coronavirus 2: NEGATIVE

## 2021-02-15 LAB — CREATININE, SERUM
Creatinine, Ser: 1.81 mg/dL — ABNORMAL HIGH (ref 0.61–1.24)
GFR, Estimated: 41 mL/min — ABNORMAL LOW (ref >60)

## 2021-02-15 SURGERY — TRANSCAROTID ARTERY REVASCULARIZATION (TCAR)
Anesthesia: General | Site: Neck | Laterality: Right

## 2021-02-15 MED ORDER — METOPROLOL TARTRATE 5 MG/5ML IV SOLN: 2.0000 mg | INTRAVENOUS | Status: DC | PRN

## 2021-02-15 MED ORDER — ONDANSETRON HCL 4 MG/2ML IJ SOLN
4.0000 mg | Freq: Four times a day (QID) | INTRAMUSCULAR | Status: DC | PRN
  Administered 2021-02-15: 4 mg via INTRAVENOUS
  Filled 2021-02-15: qty 2

## 2021-02-15 MED ORDER — MIDAZOLAM HCL 2 MG/2ML IJ SOLN
INTRAMUSCULAR | Status: AC
  Filled 2021-02-15: qty 2

## 2021-02-15 MED ORDER — ORAL CARE MOUTH RINSE: 15.0000 mL | Freq: Once | OROMUCOSAL | Status: DC

## 2021-02-15 MED ORDER — HEPARIN 6000 UNIT IRRIGATION SOLUTION
Status: DC | PRN
Start: 2021-02-15 — End: 2021-02-15
  Administered 2021-02-15: 1

## 2021-02-15 MED ORDER — ISOSORBIDE MONONITRATE ER 30 MG PO TB24
30.0000 mg | ORAL_TABLET | Freq: Every evening | ORAL | Status: DC
  Administered 2021-02-16 – 2021-02-17 (×2): 30 mg via ORAL
  Filled 2021-02-15 (×2): qty 1

## 2021-02-15 MED ORDER — PHENOL 1.4 % MT LIQD: 1.0000 | OROMUCOSAL | Status: DC | PRN

## 2021-02-15 MED ORDER — PROPOFOL 10 MG/ML IV BOLUS
INTRAVENOUS | Status: AC
  Filled 2021-02-15: qty 20

## 2021-02-15 MED ORDER — GLYCOPYRROLATE PF 0.2 MG/ML IJ SOSY
PREFILLED_SYRINGE | INTRAMUSCULAR | Status: DC | PRN
  Administered 2021-02-15 (×2): .1 mg via INTRAVENOUS
  Administered 2021-02-15: .2 mg via INTRAVENOUS

## 2021-02-15 MED ORDER — HYDRALAZINE HCL 20 MG/ML IJ SOLN: 5.0000 mg | INTRAMUSCULAR | Status: DC | PRN

## 2021-02-15 MED ORDER — MAGNESIUM SULFATE 2 GM/50ML IV SOLN: 2.0000 g | Freq: Every day | INTRAVENOUS | Status: DC | PRN

## 2021-02-15 MED ORDER — FENTANYL CITRATE (PF) 250 MCG/5ML IJ SOLN
INTRAMUSCULAR | Status: DC | PRN
Start: 2021-02-15 — End: 2021-02-15
  Administered 2021-02-15 (×2): 50 ug via INTRAVENOUS
  Administered 2021-02-15: 100 ug via INTRAVENOUS
  Administered 2021-02-15: 50 ug via INTRAVENOUS

## 2021-02-15 MED ORDER — EPHEDRINE 5 MG/ML INJ
INTRAVENOUS | Status: AC
  Filled 2021-02-15: qty 5

## 2021-02-15 MED ORDER — ACETAMINOPHEN 325 MG PO TABS
325.0000 mg | ORAL_TABLET | ORAL | Status: DC | PRN
  Administered 2021-02-16 – 2021-02-17 (×2): 650 mg via ORAL
  Filled 2021-02-15 (×2): qty 2

## 2021-02-15 MED ORDER — CEFAZOLIN SODIUM-DEXTROSE 2-4 GM/100ML-% IV SOLN
2.0000 g | INTRAVENOUS | Status: AC
  Administered 2021-02-15: 2 g via INTRAVENOUS
  Filled 2021-02-15: qty 100

## 2021-02-15 MED ORDER — HYDRALAZINE HCL 20 MG/ML IJ SOLN
5.0000 mg | Freq: Once | INTRAMUSCULAR | Status: AC
Start: 2021-02-15 — End: 2021-02-15
  Administered 2021-02-15: 5 mg via INTRAVENOUS

## 2021-02-15 MED ORDER — SUGAMMADEX SODIUM 200 MG/2ML IV SOLN
INTRAVENOUS | Status: DC | PRN
Start: 2021-02-15 — End: 2021-02-15
  Administered 2021-02-15: 200 mg via INTRAVENOUS

## 2021-02-15 MED ORDER — PROPOFOL 10 MG/ML IV BOLUS
INTRAVENOUS | Status: DC | PRN
  Administered 2021-02-15: 20 mg via INTRAVENOUS
  Administered 2021-02-15 (×2): 30 mg via INTRAVENOUS
  Administered 2021-02-15: 20 mg via INTRAVENOUS
  Administered 2021-02-15: 140 mg via INTRAVENOUS

## 2021-02-15 MED ORDER — SODIUM CHLORIDE 0.9 % IV SOLN: 500.0000 mL | Freq: Once | INTRAVENOUS | Status: DC | PRN

## 2021-02-15 MED ORDER — EPHEDRINE SULFATE-NACL 50-0.9 MG/10ML-% IV SOSY
PREFILLED_SYRINGE | INTRAVENOUS | Status: DC | PRN
Start: 2021-02-15 — End: 2021-02-15
  Administered 2021-02-15: 2.5 mg via INTRAVENOUS

## 2021-02-15 MED ORDER — PHENYLEPHRINE 40 MCG/ML (10ML) SYRINGE FOR IV PUSH (FOR BLOOD PRESSURE SUPPORT)
PREFILLED_SYRINGE | INTRAVENOUS | Status: DC | PRN
  Administered 2021-02-15 (×2): 40 ug via INTRAVENOUS
  Administered 2021-02-15 (×2): 80 ug via INTRAVENOUS
  Administered 2021-02-15: 40 ug via INTRAVENOUS

## 2021-02-15 MED ORDER — HEPARIN SODIUM (PORCINE) 5000 UNIT/ML IJ SOLN
5000.0000 [IU] | Freq: Three times a day (TID) | INTRAMUSCULAR | Status: DC
  Administered 2021-02-16 (×2): 5000 [IU] via SUBCUTANEOUS
  Filled 2021-02-15 (×4): qty 1

## 2021-02-15 MED ORDER — FERROUS SULFATE 325 (65 FE) MG PO TABS
325.0000 mg | ORAL_TABLET | Freq: Every day | ORAL | Status: DC
  Administered 2021-02-16 – 2021-02-18 (×3): 325 mg via ORAL
  Filled 2021-02-15 (×3): qty 1

## 2021-02-15 MED ORDER — SODIUM CHLORIDE 0.9 % IV SOLN: INTRAVENOUS | Status: DC

## 2021-02-15 MED ORDER — LIDOCAINE 2% (20 MG/ML) 5 ML SYRINGE
INTRAMUSCULAR | Status: DC | PRN
  Administered 2021-02-15: 40 mg via INTRAVENOUS
  Administered 2021-02-15: 60 mg via INTRAVENOUS

## 2021-02-15 MED ORDER — ACETAMINOPHEN 10 MG/ML IV SOLN: 1000.0000 mg | Freq: Once | INTRAVENOUS | Status: DC | PRN

## 2021-02-15 MED ORDER — ATORVASTATIN CALCIUM 40 MG PO TABS
40.0000 mg | ORAL_TABLET | Freq: Every evening | ORAL | Status: DC
  Administered 2021-02-15: 40 mg via ORAL
  Filled 2021-02-15: qty 1

## 2021-02-15 MED ORDER — LISINOPRIL 10 MG PO TABS
10.0000 mg | ORAL_TABLET | Freq: Every day | ORAL | Status: DC
  Administered 2021-02-16: 10 mg via ORAL
  Filled 2021-02-15: qty 1

## 2021-02-15 MED ORDER — ROCURONIUM BROMIDE 10 MG/ML (PF) SYRINGE
PREFILLED_SYRINGE | INTRAVENOUS | Status: DC | PRN
  Administered 2021-02-15: 10 mg via INTRAVENOUS
  Administered 2021-02-15: 70 mg via INTRAVENOUS

## 2021-02-15 MED ORDER — HEPARIN SODIUM (PORCINE) 1000 UNIT/ML IJ SOLN
INTRAMUSCULAR | Status: AC
  Filled 2021-02-15: qty 10

## 2021-02-15 MED ORDER — PHENYLEPHRINE 40 MCG/ML (10ML) SYRINGE FOR IV PUSH (FOR BLOOD PRESSURE SUPPORT)
PREFILLED_SYRINGE | INTRAVENOUS | Status: AC
  Filled 2021-02-15: qty 10

## 2021-02-15 MED ORDER — ONDANSETRON HCL 4 MG/2ML IJ SOLN
INTRAMUSCULAR | Status: DC | PRN
  Administered 2021-02-15: 4 mg via INTRAVENOUS

## 2021-02-15 MED ORDER — HEPARIN SODIUM (PORCINE) 1000 UNIT/ML IJ SOLN
INTRAMUSCULAR | Status: DC | PRN
  Administered 2021-02-15: 2000 [IU] via INTRAVENOUS
  Administered 2021-02-15: 7000 [IU] via INTRAVENOUS

## 2021-02-15 MED ORDER — CHLORHEXIDINE GLUCONATE CLOTH 2 % EX PADS: 6.0000 | MEDICATED_PAD | Freq: Once | CUTANEOUS | Status: DC

## 2021-02-15 MED ORDER — ASPIRIN 81 MG PO CHEW
81.0000 mg | CHEWABLE_TABLET | Freq: Once | ORAL | Status: AC
Start: 2021-02-15 — End: 2021-02-15
  Administered 2021-02-15: 81 mg via ORAL
  Filled 2021-02-15: qty 1

## 2021-02-15 MED ORDER — CEFAZOLIN SODIUM-DEXTROSE 2-4 GM/100ML-% IV SOLN
2.0000 g | Freq: Three times a day (TID) | INTRAVENOUS | Status: AC
  Administered 2021-02-15 – 2021-02-16 (×2): 2 g via INTRAVENOUS
  Filled 2021-02-15 (×2): qty 100

## 2021-02-15 MED ORDER — GUAIFENESIN-DM 100-10 MG/5ML PO SYRP: 15.0000 mL | ORAL_SOLUTION | ORAL | Status: DC | PRN

## 2021-02-15 MED ORDER — SULFAMETHOXAZOLE-TRIMETHOPRIM 800-160 MG PO TABS
1.0000 | ORAL_TABLET | Freq: Two times a day (BID) | ORAL | Status: DC
  Administered 2021-02-16: 1 via ORAL
  Filled 2021-02-15: qty 1

## 2021-02-15 MED ORDER — LABETALOL HCL 5 MG/ML IV SOLN: 10.0000 mg | INTRAVENOUS | Status: DC | PRN

## 2021-02-15 MED ORDER — SENNOSIDES-DOCUSATE SODIUM 8.6-50 MG PO TABS: 1.0000 | ORAL_TABLET | Freq: Every evening | ORAL | Status: DC | PRN

## 2021-02-15 MED ORDER — FENTANYL CITRATE (PF) 250 MCG/5ML IJ SOLN
INTRAMUSCULAR | Status: AC
  Filled 2021-02-15: qty 5

## 2021-02-15 MED ORDER — PROTAMINE SULFATE 10 MG/ML IV SOLN
INTRAVENOUS | Status: AC
  Filled 2021-02-15: qty 5

## 2021-02-15 MED ORDER — HYDRALAZINE HCL 20 MG/ML IJ SOLN
INTRAMUSCULAR | Status: AC
  Filled 2021-02-15: qty 1

## 2021-02-15 MED ORDER — PHENYLEPHRINE HCL-NACL 20-0.9 MG/250ML-% IV SOLN
INTRAVENOUS | Status: DC | PRN
  Administered 2021-02-15: 40 ug/min via INTRAVENOUS

## 2021-02-15 MED ORDER — CHLORHEXIDINE GLUCONATE 0.12 % MT SOLN
OROMUCOSAL | Status: AC
  Administered 2021-02-15: 15 mL
  Filled 2021-02-15: qty 15

## 2021-02-15 MED ORDER — HYDROMORPHONE HCL 1 MG/ML IJ SOLN: 0.5000 mg | INTRAMUSCULAR | Status: DC | PRN

## 2021-02-15 MED ORDER — 0.9 % SODIUM CHLORIDE (POUR BTL) OPTIME
TOPICAL | Status: DC | PRN
  Administered 2021-02-15 (×2): 1000 mL

## 2021-02-15 MED ORDER — FENTANYL CITRATE (PF) 100 MCG/2ML IJ SOLN: 25.0000 ug | INTRAMUSCULAR | Status: DC | PRN

## 2021-02-15 MED ORDER — PROTAMINE SULFATE 10 MG/ML IV SOLN
INTRAVENOUS | Status: DC | PRN
  Administered 2021-02-15: 50 mg via INTRAVENOUS

## 2021-02-15 MED ORDER — METOPROLOL SUCCINATE ER 25 MG PO TB24
25.0000 mg | ORAL_TABLET | Freq: Every evening | ORAL | Status: DC
  Administered 2021-02-16 – 2021-02-17 (×2): 25 mg via ORAL
  Filled 2021-02-15 (×2): qty 1

## 2021-02-15 MED ORDER — CLOPIDOGREL BISULFATE 75 MG PO TABS
75.0000 mg | ORAL_TABLET | Freq: Every day | ORAL | Status: DC
  Administered 2021-02-16 – 2021-02-18 (×3): 75 mg via ORAL
  Filled 2021-02-15 (×3): qty 1

## 2021-02-15 MED ORDER — DEXAMETHASONE SODIUM PHOSPHATE 10 MG/ML IJ SOLN
INTRAMUSCULAR | Status: DC | PRN
Start: 2021-02-15 — End: 2021-02-15
  Administered 2021-02-15: 10 mg via INTRAVENOUS

## 2021-02-15 MED ORDER — OXYCODONE-ACETAMINOPHEN 5-325 MG PO TABS
1.0000 | ORAL_TABLET | ORAL | Status: DC | PRN
  Administered 2021-02-15: 2 via ORAL
  Filled 2021-02-15: qty 2

## 2021-02-15 MED ORDER — CLEVIDIPINE BUTYRATE 0.5 MG/ML IV EMUL: INTRAVENOUS | Status: DC | PRN

## 2021-02-15 MED ORDER — PANTOPRAZOLE SODIUM 40 MG PO TBEC
40.0000 mg | DELAYED_RELEASE_TABLET | Freq: Every day | ORAL | Status: DC
  Administered 2021-02-15 – 2021-02-18 (×4): 40 mg via ORAL
  Filled 2021-02-15 (×4): qty 1

## 2021-02-15 MED ORDER — HEMOSTATIC AGENTS (NO CHARGE) OPTIME
TOPICAL | Status: DC | PRN
Start: 2021-02-15 — End: 2021-02-15
  Administered 2021-02-15: 1 via TOPICAL

## 2021-02-15 MED ORDER — IODIXANOL 320 MG/ML IV SOLN
INTRAVENOUS | Status: DC | PRN
  Administered 2021-02-15: 60 mL via INTRA_ARTERIAL

## 2021-02-15 MED ORDER — DOCUSATE SODIUM 100 MG PO CAPS
100.0000 mg | ORAL_CAPSULE | Freq: Every day | ORAL | Status: DC
  Administered 2021-02-16 – 2021-02-18 (×3): 100 mg via ORAL
  Filled 2021-02-15 (×3): qty 1

## 2021-02-15 MED ORDER — POTASSIUM CHLORIDE CRYS ER 20 MEQ PO TBCR: 20.0000 meq | EXTENDED_RELEASE_TABLET | Freq: Every day | ORAL | Status: DC | PRN

## 2021-02-15 MED ORDER — ASPIRIN EC 81 MG PO TBEC
81.0000 mg | DELAYED_RELEASE_TABLET | Freq: Every day | ORAL | Status: DC
  Administered 2021-02-15 – 2021-02-18 (×4): 81 mg via ORAL
  Filled 2021-02-15 (×4): qty 1

## 2021-02-15 MED ORDER — GLYCOPYRROLATE PF 0.2 MG/ML IJ SOSY
PREFILLED_SYRINGE | INTRAMUSCULAR | Status: AC
  Filled 2021-02-15: qty 2

## 2021-02-15 MED ORDER — LACTATED RINGERS IV SOLN
INTRAVENOUS | Status: DC | PRN
Start: 2021-02-15 — End: 2021-02-15

## 2021-02-15 MED ORDER — CHLORHEXIDINE GLUCONATE 0.12 % MT SOLN: 15.0000 mL | Freq: Once | OROMUCOSAL | Status: DC

## 2021-02-15 MED ORDER — CLEVIDIPINE BUTYRATE 0.5 MG/ML IV EMUL
INTRAVENOUS | Status: DC | PRN
  Administered 2021-02-15: 2 mg/h via INTRAVENOUS

## 2021-02-15 MED ORDER — ACETAMINOPHEN 650 MG RE SUPP: 325.0000 mg | RECTAL | Status: DC | PRN

## 2021-02-15 MED ORDER — LACTATED RINGERS IV SOLN: INTRAVENOUS | Status: DC

## 2021-02-15 MED ORDER — ALUM & MAG HYDROXIDE-SIMETH 200-200-20 MG/5ML PO SUSP: 15.0000 mL | ORAL | Status: DC | PRN

## 2021-02-15 SURGICAL SUPPLY — 57 items
BAG BANDED W/RUBBER/TAPE 36X54 (MISCELLANEOUS) ×3 IMPLANT
BAG COUNTER SPONGE SURGICOUNT (BAG) ×3 IMPLANT
BALLN STERLING RX 6X30X80 (BALLOONS) ×3
BALLN STERLING RX 7X30X80 (BALLOONS) ×3
BALLOON STERLING RX 6X30X80 (BALLOONS) IMPLANT
BALLOON STERLING RX 7X30X80 (BALLOONS) IMPLANT
CANISTER SUCT 3000ML PPV (MISCELLANEOUS) ×3 IMPLANT
CATH BEACON 5 .035 40 KMP TP (CATHETERS) IMPLANT
CATH BEACON 5 .038 40 KMP TP (CATHETERS) ×1
CATH NAVICROSS ANG 65CM (CATHETERS) IMPLANT
CATHETER NAVICROSS ANG 65CM (CATHETERS) ×3
CLIP VESOCCLUDE MED 6/CT (CLIP) ×3 IMPLANT
CLIP VESOCCLUDE SM WIDE 6/CT (CLIP) ×3 IMPLANT
COVER DOME SNAP 22 D (MISCELLANEOUS) ×3 IMPLANT
COVER PROBE W GEL 5X96 (DRAPES) ×3 IMPLANT
DERMABOND ADVANCED (GAUZE/BANDAGES/DRESSINGS) ×1
DERMABOND ADVANCED .7 DNX12 (GAUZE/BANDAGES/DRESSINGS) ×2 IMPLANT
DEVICE TORQUE KENDALL .025-038 (MISCELLANEOUS) ×1 IMPLANT
DRAPE FEMORAL ANGIO 80X135IN (DRAPES) ×3 IMPLANT
ELECT REM PT RETURN 9FT ADLT (ELECTROSURGICAL) ×3
ELECTRODE REM PT RTRN 9FT ADLT (ELECTROSURGICAL) ×2 IMPLANT
GLOVE SURG ENC MOIS LTX SZ7.5 (GLOVE) ×3 IMPLANT
GOWN STRL REUS W/ TWL LRG LVL3 (GOWN DISPOSABLE) ×4 IMPLANT
GOWN STRL REUS W/ TWL XL LVL3 (GOWN DISPOSABLE) ×2 IMPLANT
GOWN STRL REUS W/TWL LRG LVL3 (GOWN DISPOSABLE) ×2
GOWN STRL REUS W/TWL XL LVL3 (GOWN DISPOSABLE) ×1
GUIDEWIRE ANGLED .035X150CM (WIRE) ×1 IMPLANT
GUIDEWIRE ENROUTE 0.014 (WIRE) ×3 IMPLANT
HEMOSTAT SNOW SURGICEL 2X4 (HEMOSTASIS) ×1 IMPLANT
INTRODUCER KIT GALT 7CM (INTRODUCER) ×1
KIT BASIN OR (CUSTOM PROCEDURE TRAY) ×3 IMPLANT
KIT ENCORE 26 ADVANTAGE (KITS) ×3 IMPLANT
KIT INTRODUCER GALT 7 (INTRODUCER) ×2 IMPLANT
KIT TURNOVER KIT B (KITS) ×3 IMPLANT
PACK CAROTID (CUSTOM PROCEDURE TRAY) ×3 IMPLANT
POSITIONER HEAD DONUT 9IN (MISCELLANEOUS) ×3 IMPLANT
PROTECTION STATION PRESSURIZED (MISCELLANEOUS) ×3
SET MICROPUNCTURE 5F STIFF (MISCELLANEOUS) ×3 IMPLANT
SHEATH PINNACLE 5FR (SHEATH) IMPLANT
SHUNT CAROTID BYPASS 10 (VASCULAR PRODUCTS) IMPLANT
SHUNT CAROTID BYPASS 12FRX15.5 (VASCULAR PRODUCTS) IMPLANT
STATION PROTECTION PRESSURIZED (MISCELLANEOUS) ×2 IMPLANT
STENT TRANSCAROTID SYS 10X40 (Permanent Stent) ×1 IMPLANT
SUT MNCRL AB 4-0 PS2 18 (SUTURE) ×3 IMPLANT
SUT PROLENE 5 0 C 1 24 (SUTURE) ×3 IMPLANT
SUT SILK 2 0 PERMA HAND 18 BK (SUTURE) ×1 IMPLANT
SUT VIC AB 3-0 SH 27 (SUTURE) ×1
SUT VIC AB 3-0 SH 27X BRD (SUTURE) ×2 IMPLANT
SYR 10ML LL (SYRINGE) ×9 IMPLANT
SYR 20ML LL LF (SYRINGE) ×3 IMPLANT
SYR CONTROL 10ML LL (SYRINGE) IMPLANT
SYSTEM TRANSCAROTID NEUROPRTCT (MISCELLANEOUS) ×2 IMPLANT
TOWEL GREEN STERILE (TOWEL DISPOSABLE) ×3 IMPLANT
TRANSCAROTID NEUROPROTECT SYS (MISCELLANEOUS) ×3
WATER STERILE IRR 1000ML POUR (IV SOLUTION) ×3 IMPLANT
WIRE BENTSON .035X145CM (WIRE) ×3 IMPLANT
WIRE SPARTACORE .014X190CM (WIRE) ×2 IMPLANT

## 2021-02-15 NOTE — Op Note (Signed)
Date: February 15, 2021  Preoperative diagnosis: Asymptomatic high-grade right internal carotid artery stenosis  Postoperative diagnosis: Same  Procedure: 1.  Ultrasound-guided access left common femoral vein for delivery of TCAR venous flow reversal sheath 2.  Transcatheter placement of intravascular stent in the right carotid artery after cutdown with percutaneous access including angioplasty with distal embolic protection (right TCAR)  Surgeon: Dr. [name] Heck, MD  Assistant: Dr. Servando Snare, MD and Paulo Fruit, [place]  Indications: Patient is a 66 year old male that has had prior head and neck cancer requiring extensive resection with free flap and also radiation that has been followed for asymptomatic carotid stenosis.  Most recently evaluated by Dr. Donnetta Hutching with evidence of high-grade calcified right internal carotid artery stenosis.  He presents today for planned TCAR with angioplasty and stent placement using flow reversal after risk benefits discussed.  An assistant was needed for wire exchange and for difficulty crossing the lesion.  Findings: After cutdown on the right common carotid artery with placement of the arterial sheath, active flow reversal was established and clamp time was 61 minutes given very high-grade string sign in the proximal internal carotid artery that was heavily calcified and we had a very difficult time crossing the lesion.  Ultimately the wire wanted to keep tracking into the external carotid and its branches.  Ultimately we were finally able to cross the lesion in the proximal internal carotid artery and this was treated with a 6 mm x 30 mm angioplasty balloon and then stented with a 10 mm x 40 mm Enroute stent.  There was greater than 30% residual stenosis due to heavy calcification in the distal common carotid artery stent and we treated this with an additional 7 mm x 30 mm post stent angioplasty balloon with good results at about 30% residual  stenosis.  Anesthesia: General  Details: Patient was taken to the operating room after informed consent was obtained.  Placed on the operative table in the supine position and general endotracheal anesthesia was induced.  Ultimately a bump was placed under the shoulder and his head was turned away to the left.  The right neck and bilateral groins were prepped and draped in standard sterile fashion.  Antibiotics were given.  Timeout was performed.  Initially ultrasound was used to evaluate the left common femoral vein, it was patent, and image was saved.  This was accessed with a micro access needle, placed a microwire, and then a micro sheath.  I then placed a Bentson wire and exchanged for a TCAR venous flow reversal sheath in the left common femoral vein.  I then performed a cutdown on the common carotid artery on the right one fingerbreadth above the clavicle.  A transverse incision was made and dissected down with Bovie cautery through the platysma and found the heads of the sternocleidomastoid that were divided and ultimately the internal jugular vein and vagus nerve were dissected away from the common carotid artery.  Patient was given 100 units/kg IV heparin.  ACT was checked to maintain greater than 250.  I placed a vessel loop around the common carotid in the surgical wound with an umbilical tape.  I then placed a pursestring with a 5-0 Prolene on the anterior wall the right common carotid just above the clavicle.  This was accessed with micro access needle, placed a microwire, and then a micro sheath.  We then got imaging of the distal common carotid carotid bifurcation as well as the internal/external carotid artery.  There was  a heavily calcified stenosis in the distal common carotid into the proximal internal carotid artery.  We looked at this in multiple views right anterior oblique, AP, and left anterior oblique to try and get the best imaging.  We then advanced a J-wire and exchanged for the  arterial sheath in the common carotid artery and then we connected the filter with passive flow reversal to the sheath in the left groin.  TCAR timeout was performed and we went on active clamp on the right common carotid artery and ensured that we had active flow reversal.  Patient was also given glycopyrrolate.  We then used initially the preloaded angioplasty balloon with an 014 wire to try and cross the stenosis.  We made a number of attempts and the wire wanted to go up the external carotid and its associated branches.  Finally we used a KMP catheter with a 014 wire and still were not successful in cannulating the ICA.  We then ultimately switched to a glide cath and even tried a [place] core wire ultimately transitioning to a soft angled glidewire again unsuccessful.  Finally after looking at this in multiple images particularly in a steep right anterior oblique image we could see a small flow channel in the proximal internal carotid artery.  Finally with a 0.014 [place] core wire and an angled KMP catheter we are able to cross the proximal high-grade internal carotid artery stenosis that was very calcified with a string sign and get into the true lumen of the artery distally.  I then was able to advance my balloon and this was predilated with a 6 mm x 30 mm angioplasty balloon.  We then stented this with a 10 mm x 40 mm Enroute stent.  We took several images after stenting in different projections and there was greater than 30% residual stenosis in the stent in the distal common carotid artery so we postdilated this with a 7 mm x 30 mm balloon with good results and about 30% residual stenosis given heavy calcification.  That point in time we were satisfied with the results.  No evidence of dissection and good filling of the vessel distally.  We allowed another 2 minutes of active flow reversal.  Wires and catheters were removed and we disconnected the filter.  I removed the arterial sheath in the common carotid  while tying down the pursestring.  We listened with Doppler and had good flow in the common carotid.  Protamine was given for reversal.  The venous sheath in the left groin was then removed and manual pressure was held.  The neck was irrigated out and I used Surgicel snow and the neck incision was closed in multiple layers of 3-0 Vicryl 4-0 Monocryl and Dermabond.  Awakened with no deficits and taken to recovery in stable condition.  Complication: None  Condition: Stable  [name] Heck, MD [facility] [place] Office: [place]

## 2021-02-15 NOTE — Transfer of Care (Signed)
Immediate Anesthesia Transfer of Care Note  Patient: Michael [name]  Procedure(s) Performed: RIGHT TRANSCAROTID ARTERY REVASCULARIZATION (Right: Neck) ULTRASOUND GUIDANCE FOR VASCULAR ACCESS (Left: Groin)  Patient Location: PACU  Anesthesia Type:General  Level of Consciousness: awake and drowsy  Airway & Oxygen Therapy: Patient Spontanous Breathing  Post-op Assessment: Report given to RN and Post -op Vital signs reviewed and stable  Post vital signs: Reviewed and stable  Last Vitals:  Vitals Value Taken Time  BP 124/72   Temp    Pulse 83   Resp 14   SpO2 94     Last Pain:  Vitals:   02/15/21 0806  TempSrc:   PainSc: 0-No pain         Complications: No notable events documented.

## 2021-02-15 NOTE — OR PostOp (Incomplete)
PACU TO INPATIENT HANDOFF REPORT  Name/Age/Gender Michael [name] 66 y.o. male  Code Status Code Status History     Date Active Date Inactive Code Status Order ID Comments User Context   10/29/2018 2015 11/01/2018 1606 Full Code [id number]  Lattie Corns Inpatient   05/09/2018 0424 05/13/2018 1538 Full Code [id number]  Jani Gravel, MD ED   09/17/2017 0818 09/19/2017 1553 Full Code [id number]  Murlean Iba, MD ED   09/01/2017 0829 09/02/2017 1528 Full Code [id number]  Heath Lark D, DO Inpatient   07/05/2017 0021 07/07/2017 2134 Full Code [id number]  Norval Morton, MD ED   06/24/2017 2236 06/25/2017 1822 Full Code [id number]  Truett Mainland, DO Inpatient   04/02/2017 0326 04/06/2017 1822 Full Code [id number]  Rodena Goldmann, DO ED       Home/SNF/Other {Discharge Destination:18313::"Home"}  Chief Complaint Carotid stenosis, asymptomatic, right [I65.21]  Level of Care/Admitting Diagnosis ED Disposition     None       Medical History Past Medical History:  Diagnosis Date   Carotid artery disease ([place])    Chronic diastolic heart failure (HCC)    CKD (chronic kidney disease) stage 3, GFR 30-59 ml/min (HCC)    COPD (chronic obstructive pulmonary disease) ([place])    Coronary artery disease    Essential hypertension    Head and neck cancer ([place]) 2019   Right facial basal cell carcinoma s/p resection with right partial mastectomy and partal rhinectomy with skin graft (06/13/17)   Iron deficiency anemia    Urinary retention     Allergies No Known Allergies  IV Location/Drains/Wounds Patient Lines/Drains/Airways Status     Active Line/Drains/Airways     Name Placement date Placement time Site Days   Arterial Line 02/15/21 Right Radial 02/15/21  0905  Radial  less than 1   Peripheral IV 02/15/21 18 G Left;Posterior Wrist 02/15/21  0803  Wrist  less than 1   Peripheral IV 02/15/21 18 G Left Forearm 02/15/21  1115  Forearm  less than 1   Incision (Closed) 02/15/21 Neck Right  02/15/21  1157  -- less than 1   Incision (Closed) 02/15/21 Groin Left 02/15/21  1305  -- less than 1            Labs/Imaging Results for orders placed or performed during the hospital encounter of 02/15/21 (from the past 48 hour(s))  SARS Coronavirus 2 by RT PCR (hospital order, performed in [facility] hospital lab) Nasopharyngeal Nasopharyngeal Swab     Status: None   Collection Time: 02/15/21  8:26 AM   Specimen: Nasopharyngeal Swab  Result Value Ref Range   SARS Coronavirus 2 NEGATIVE NEGATIVE    Comment: (NOTE) SARS-CoV-2 target nucleic acids are NOT DETECTED.  The SARS-CoV-2 RNA is generally detectable in upper and lower respiratory specimens during the acute phase of infection. The lowest concentration of SARS-CoV-2 viral copies this assay can detect is 250 copies / mL. A negative result does not preclude SARS-CoV-2 infection and should not be used as the sole basis for treatment or other patient management decisions.  A negative result may occur with improper specimen collection / handling, submission of specimen other than nasopharyngeal swab, presence of viral mutation(s) within the areas targeted by this assay, and inadequate number of viral copies (<250 copies / mL). A negative result must be combined with clinical observations, patient history, and epidemiological information.  Fact Sheet for Patients:   StrictlyIdeas.no  Fact Sheet for Healthcare Providers: BankingDealers.co.za  This test  is not yet approved or  cleared by the [place] and has been authorized for detection and/or diagnosis of SARS-CoV-2 by FDA under an Emergency Use Authorization (EUA).  This EUA will remain in effect (meaning this test can be used) for the duration of the COVID-19 declaration under Section 564(b)(1) of the Act, 21 U.S.C. section 360bbb-3(b)(1), unless the authorization is terminated or revoked sooner.  Performed at  [facility], [place] [address], [place], [place] [address]   CBC     Status: Abnormal   Collection Time: 02/15/21  3:10 PM  Result Value Ref Range   WBC 10.3 4.0 - 10.5 K/uL   RBC 3.91 (L) 4.22 - 5.81 MIL/uL   Hemoglobin 10.8 (L) 13.0 - 17.0 g/dL   HCT 33.3 (L) 39.0 - 52.0 %   MCV 85.2 80.0 - 100.0 fL   MCH 27.6 26.0 - 34.0 pg   MCHC 32.4 30.0 - 36.0 g/dL   RDW 14.1 11.5 - 15.5 %   Platelets 269 150 - 400 K/uL   nRBC 0.0 0.0 - 0.2 %    Comment: Performed at [facility], [place] [address], [place], [place] [address]   Structural Heart Procedure  Result Date: 02/15/2021 See surgical note for result.  HYBRID OR IMAGING ([facility] ONLY)  Result Date: 02/15/2021 There is no interpretation for this exam.  This order is for images obtained during a surgical procedure.  Please See "Surgeries" Tab for more information regarding the procedure.    Pending Labs   Vitals/Pain Today's Vitals   02/15/21 1425 02/15/21 1440 02/15/21 1455 02/15/21 1510  BP: 127/63 (!) 126/59 116/62 130/71  Pulse: 76 75 78 79  Resp: 14 15 16 15   Temp:      TempSrc:      SpO2: 96% 98% 98% 96%  Weight:      Height:      PainSc: 0-No pain 0-No pain  0-No pain    Isolation Precautions @ISOLATION @  Administered Medications Periop Administered Meds from 02/15/2021 0734 to 02/15/2021 1538       Date/Time Order Dose Route Action Action by Comments    02/15/2021 1103 EST 0.9 % irrigation (POUR BTL) 1,000 mL Irrigation Given [name] Heck, MD --    02/15/2021 1102 EST 0.9 % irrigation (POUR BTL) 1,000 mL Irrigation Given [name] Heck, MD --    02/15/2021 1016 EST aspirin chewable tablet 81 mg 81 mg Oral Given Alphonsus Sias, RN --    02/15/2021 1115 EST ceFAZolin (ANCEF) IVPB 2g/100 mL premix 2 g Intravenous Given Dorann Lodge, CRNA --    02/15/2021 0810 EST chlorhexidine (PERIDEX) 0.12 % solution 15 mL  Given Scalco, Alvis Lemmings, RN --    02/15/2021 1311 EST clevidipine (CLEVIPREX)  infusion 0.5 mg/mL 2 mg/hr Intravenous Canceled Entry Wyssbrod, Adrienne L, CRNA --    02/15/2021 1330 EST clevidipine (CLEVIPREX) infusion 0.5 mg/mL 0 mg/hr Intravenous Stopped Wyssbrod, Velna Ochs, CRNA --    02/15/2021 1326 EST clevidipine (CLEVIPREX) infusion 0.5 mg/mL 6 mg/hr Intravenous Rate/Dose Change Wyssbrod, Velna Ochs, CRNA --    02/15/2021 1324 EST clevidipine (CLEVIPREX) infusion 0.5 mg/mL 4 mg/hr Intravenous Rate/Dose Change Wyssbrod, Velna Ochs, CRNA --    02/15/2021 1318 EST clevidipine (CLEVIPREX) infusion 0.5 mg/mL 2 mg/hr Intravenous New Bag/Given Dorann Lodge, CRNA --    02/15/2021 1123 EST dexamethasone (DECADRON) injection 10 mg Intravenous Given Dorann Lodge, CRNA --    02/15/2021 1156 EST ePHEDrine sulfate (PF) 5mg /mL  syringe 2.5 mg Intravenous Given Dorann Lodge, CRNA --    02/15/2021 1317 EST fentaNYL citrate (PF) (SUBLIMAZE) injection 50 mcg Intravenous Given Dorann Lodge, CRNA --    02/15/2021 1144 EST fentaNYL citrate (PF) (SUBLIMAZE) injection 50 mcg Intravenous Given Dorann Lodge, CRNA --    02/15/2021 1127 EST fentaNYL citrate (PF) (SUBLIMAZE) injection 50 mcg Intravenous Given Wyssbrod, Velna Ochs, CRNA --    02/15/2021 1104 EST fentaNYL citrate (PF) (SUBLIMAZE) injection 100 mcg Intravenous Given Dorann Lodge, CRNA --    02/15/2021 1256 EST glycopyrrolate (ROBINUL) injection 0.1 mg Intravenous Given Dorann Lodge, CRNA --    02/15/2021 1204 EST glycopyrrolate (ROBINUL) injection 0.1 mg Intravenous Given Dorann Lodge, CRNA --    02/15/2021 1112 EST glycopyrrolate (ROBINUL) injection 0.2 mg Intravenous Given Dorann Lodge, CRNA --    02/15/2021 1312 EST hemostatic agents (no charge) Optime 1 application Topical Given [name] Heck, MD surgicel snow 2in x 4in    02/15/2021 1103 EST heparin 6000 units / NS 500 mL irrigation 1 application Irrigation Given [name] Heck, MD --     02/15/2021 1250 EST heparin sodium (porcine) injection 2,000 Units Intravenous Given Dorann Lodge, CRNA --    02/15/2021 1150 EST heparin sodium (porcine) injection 7,000 Units Intravenous Given Dorann Lodge, CRNA --    02/15/2021 [phone] EST hydrALAZINE (APRESOLINE) injection 5 mg 5 mg Intravenous Given Scalco, Alvis Lemmings, RN --    02/15/2021 1312 EST iodixanol (VISIPAQUE) 320 MG/ML injection 60 mL Intra-arterial Given [name] Heck, MD --    02/15/2021 1322 EST lactated ringers infusion -- Intravenous Anesthesia Volume Adjustment Dorann Lodge, CRNA --    02/15/2021 1035 EST lactated ringers infusion -- Intravenous Restarted Dorann Lodge, CRNA --    02/15/2021 1034 EST lactated ringers infusion -- Intravenous Paused Dorann Lodge, CRNA Switch to gravity    02/15/2021 0626 EST lactated ringers infusion -- Intravenous New Bag/Given Alphonsus Sias, RN --    02/15/2021 1322 EST lactated ringers infusion -- Intravenous Anesthesia Volume Adjustment Dorann Lodge, CRNA --    02/15/2021 1115 EST lactated ringers infusion -- Intravenous New Bag/Given Dorann Lodge, CRNA --    02/15/2021 1319 EST lidocaine 2% (20 mg/mL) 5 mL syringe 40 mg Intravenous Given Dorann Lodge, CRNA --    02/15/2021 1107 EST lidocaine 2% (20 mg/mL) 5 mL syringe 60 mg Intravenous Given Stoltzfus, March Rummage, DO --    02/15/2021 1313 EST ondansetron (ZOFRAN) injection 4 mg Intravenous Given Dorann Lodge, CRNA --    02/15/2021 1307 EST phenylephrine (NEO-SYNEPHRINE) 20mg /NS 246mL premix infusion 0 mcg/min Intravenous Stopped Wyssbrod, Velna Ochs, CRNA --    02/15/2021 1256 EST phenylephrine (NEO-SYNEPHRINE) 20mg /NS 264mL premix infusion 85 mcg/min Intravenous Rate/Dose Change Wyssbrod, Velna Ochs, CRNA --    02/15/2021 1248 EST phenylephrine (NEO-SYNEPHRINE) 20mg /NS 237mL premix infusion 75 mcg/min Intravenous Rate/Dose Change Wyssbrod, Velna Ochs, CRNA --    02/15/2021  1247 EST phenylephrine (NEO-SYNEPHRINE) 20mg /NS 256mL premix infusion 50 mcg/min Intravenous Restarted Dorann Lodge, CRNA --    02/15/2021 1243 EST phenylephrine (NEO-SYNEPHRINE) 20mg /NS 233mL premix infusion 0 mcg/min Intravenous Stopped Wyssbrod, Velna Ochs, CRNA --    02/15/2021 1232 EST phenylephrine (NEO-SYNEPHRINE) 20mg /NS 236mL premix infusion 50 mcg/min Intravenous Rate/Dose Change Wyssbrod, Velna Ochs, CRNA --    02/15/2021 1227 EST phenylephrine (NEO-SYNEPHRINE) 20mg /NS 277mL premix infusion 60 mcg/min Intravenous Rate/Dose Change Dorann Lodge, CRNA --    02/15/2021 1222 EST phenylephrine (NEO-SYNEPHRINE)  20mg /NS 241mL premix infusion 75 mcg/min Intravenous Rate/Dose Change Dorann Lodge, CRNA --    02/15/2021 1218 EST phenylephrine (NEO-SYNEPHRINE) 20mg /NS 243mL premix infusion 85 mcg/min Intravenous Rate/Dose Change Wyssbrod, Velna Ochs, CRNA --    02/15/2021 1211 EST phenylephrine (NEO-SYNEPHRINE) 20mg /NS 28mL premix infusion 90 mcg/min Intravenous Rate/Dose Change Wyssbrod, Velna Ochs, CRNA --    02/15/2021 1210 EST phenylephrine (NEO-SYNEPHRINE) 20mg /NS 21mL premix infusion 95 mcg/min Intravenous Rate/Dose Change Wyssbrod, Velna Ochs, CRNA --    02/15/2021 1202 EST phenylephrine (NEO-SYNEPHRINE) 20mg /NS 228mL premix infusion 100 mcg/min Intravenous Rate/Dose Change Wyssbrod, Velna Ochs, CRNA --    02/15/2021 1201 EST phenylephrine (NEO-SYNEPHRINE) 20mg /NS 24mL premix infusion 90 mcg/min Intravenous Rate/Dose Change Wyssbrod, Velna Ochs, CRNA --    02/15/2021 1158 EST phenylephrine (NEO-SYNEPHRINE) 20mg /NS 217mL premix infusion 85 mcg/min Intravenous Rate/Dose Change Wyssbrod, Velna Ochs, CRNA --    02/15/2021 1157 EST phenylephrine (NEO-SYNEPHRINE) 20mg /NS 217mL premix infusion 100 mcg/min Intravenous Rate/Dose Change Wyssbrod, Velna Ochs, CRNA --    02/15/2021 1154 EST phenylephrine (NEO-SYNEPHRINE) 20mg /NS 257mL premix infusion 100 mcg/min Intravenous Rate/Dose  Change Wyssbrod, Velna Ochs, CRNA --    02/15/2021 1152 EST phenylephrine (NEO-SYNEPHRINE) 20mg /NS 225mL premix infusion 60 mcg/min Intravenous Rate/Dose Change Wyssbrod, Velna Ochs, CRNA --    02/15/2021 1148 EST phenylephrine (NEO-SYNEPHRINE) 20mg /NS 2110mL premix infusion 30 mcg/min Intravenous Restarted Dorann Lodge, CRNA --    02/15/2021 1144 EST phenylephrine (NEO-SYNEPHRINE) 20mg /NS 264mL premix infusion 0 mcg/min Intravenous Stopped Wyssbrod, Velna Ochs, CRNA --    02/15/2021 1134 EST phenylephrine (NEO-SYNEPHRINE) 20mg /NS 225mL premix infusion 60 mcg/min Intravenous Rate/Dose Change Wyssbrod, Velna Ochs, CRNA --    02/15/2021 1131 EST phenylephrine (NEO-SYNEPHRINE) 20mg /NS 216mL premix infusion 40 mcg/min Intravenous Rate/Dose Change Wyssbrod, Velna Ochs, CRNA --    02/15/2021 1127 EST phenylephrine (NEO-SYNEPHRINE) 20mg /NS 24mL premix infusion 30 mcg/min Intravenous Rate/Dose Change Wyssbrod, Velna Ochs, CRNA --    02/15/2021 1124 EST phenylephrine (NEO-SYNEPHRINE) 20mg /NS 216mL premix infusion 60 mcg/min Intravenous Rate/Dose Change Wyssbrod, Velna Ochs, CRNA --    02/15/2021 1112 EST phenylephrine (NEO-SYNEPHRINE) 20mg /NS 230mL premix infusion 40 mcg/min Intravenous New Bag/Given Dorann Lodge, CRNA --    02/15/2021 1155 EST PHENYLephrine 40 mcg/ml in normal saline Adult IV Push Syringe (For Blood Pressure Support) 40 mcg Intravenous Given Dorann Lodge, CRNA --    02/15/2021 1135 EST PHENYLephrine 40 mcg/ml in normal saline Adult IV Push Syringe (For Blood Pressure Support) 40 mcg Intravenous Given Dorann Lodge, CRNA --    02/15/2021 1131 EST PHENYLephrine 40 mcg/ml in normal saline Adult IV Push Syringe (For Blood Pressure Support) 40 mcg Intravenous Given Dorann Lodge, CRNA --    02/15/2021 1123 EST PHENYLephrine 40 mcg/ml in normal saline Adult IV Push Syringe (For Blood Pressure Support) 80 mcg Intravenous Given Dorann Lodge, CRNA --     02/15/2021 1112 EST PHENYLephrine 40 mcg/ml in normal saline Adult IV Push Syringe (For Blood Pressure Support) 80 mcg Intravenous Given Dorann Lodge, CRNA --    02/15/2021 1317 EST propofol (DIPRIVAN) 10 mg/mL bolus/IV push 20 mg Intravenous Given Dorann Lodge, CRNA --    02/15/2021 1244 EST propofol (DIPRIVAN) 10 mg/mL bolus/IV push 30 mg Intravenous Given Dorann Lodge, CRNA --    02/15/2021 1242 EST propofol (DIPRIVAN) 10 mg/mL bolus/IV push 30 mg Intravenous Given Dorann Lodge, CRNA --    02/15/2021 1146 EST propofol (DIPRIVAN) 10 mg/mL bolus/IV push 20 mg Intravenous Given Dorann Lodge, CRNA --    02/15/2021 1107  EST propofol (DIPRIVAN) 10 mg/mL bolus/IV push 140 mg Intravenous Given Stoltzfus, March Rummage, DO --    02/15/2021 1310 EST protamine injection 50 mg Intravenous Given Wyssbrod, Velna Ochs, CRNA --    02/15/2021 1233 EST rocuronium bromide 10 mg/mL (PF) syringe 10 mg Intravenous Given Dorann Lodge, CRNA --    02/15/2021 1107 EST rocuronium bromide 10 mg/mL (PF) syringe 70 mg Intravenous Given Stoltzfus, March Rummage, DO --    02/15/2021 1322 EST sugammadex sodium (BRIDION) injection 200 mg Intravenous Given Wyssbrod, Velna Ochs, CRNA --       Mobility {Mobility:20148}

## 2021-02-15 NOTE — Anesthesia Postprocedure Evaluation (Signed)
Anesthesia Post Note  Patient: [name]  Procedure(s) Performed: RIGHT TRANSCAROTID ARTERY REVASCULARIZATION (Right: Neck) ULTRASOUND GUIDANCE FOR VASCULAR ACCESS (Left: Groin)     Patient location during evaluation: PACU Anesthesia Type: General Level of consciousness: awake and alert Pain management: pain level controlled Vital Signs Assessment: post-procedure vital signs reviewed and stable Respiratory status: spontaneous breathing, nonlabored ventilation, respiratory function stable and patient connected to nasal cannula oxygen Cardiovascular status: blood pressure returned to baseline and stable Postop Assessment: no apparent nausea or vomiting Anesthetic complications: no   No notable events documented.  Last Vitals:  Vitals:   02/15/21 1557 02/15/21 1600  BP: (!) 110/58 101/77  Pulse: 76 68  Resp: 14 17  Temp: 36.7 C   SpO2: 100% 98%    Last Pain:  Vitals:   02/15/21 1557  TempSrc: Oral  PainSc:                  March Rummage Esteen Delpriore

## 2021-02-15 NOTE — Progress Notes (Signed)
BP elevated in short stay. Patient verbalized that he took his BP medicine this morning @ 5 AM. Dr. Gloris Manchester notified.

## 2021-02-15 NOTE — Anesthesia Preprocedure Evaluation (Addendum)
Anesthesia Evaluation  Patient identified by MRN, date of birth, ID band Patient awake    Reviewed: Allergy & Precautions, NPO status , Patient's Chart, lab work & pertinent test results  Airway Mallampati: III  TM Distance: >3 FB Neck ROM: Full    Dental  (+) Edentulous Upper, Missing, Poor Dentition   Pulmonary COPD, Current Smoker and Patient abstained from smoking.,    Pulmonary exam normal        Cardiovascular hypertension, Pt. on medications and Pt. on home beta blockers + CAD and +CHF   Rhythm:Regular Rate:Normal     Neuro/Psych Carotid stenosis negative psych ROS   GI/Hepatic negative GI ROS, Neg liver ROS,   Endo/Other  negative endocrine ROS  Renal/GU CRFRenal disease  negative genitourinary   Musculoskeletal negative musculoskeletal ROS (+)   Abdominal Normal abdominal exam  (+)   Peds  Hematology  (+) anemia ,   Anesthesia Other Findings Right side skin flap well healed s/p 2019 facial reconstruction 2019 with partial mastectomy and rhinectomy  Reproductive/Obstetrics                            Anesthesia Physical Anesthesia Plan  ASA: 3  Anesthesia Plan: General   Post-op Pain Management:    Induction: Intravenous  PONV Risk Score and Plan: 1 and Ondansetron, Dexamethasone and Treatment may vary due to age or medical condition  Airway Management Planned: Mask and Oral ETT  Additional Equipment: Arterial line  Intra-op Plan:   Post-operative Plan: Extubation in OR  Informed Consent: I have reviewed the patients History and Physical, chart, labs and discussed the procedure including the risks, benefits and alternatives for the proposed anesthesia with the patient or authorized representative who has indicated his/her understanding and acceptance.     Dental advisory given  Plan Discussed with: CRNA  Anesthesia Plan Comments: (Lab Results      Component                 Value               Date                      WBC                      12.5 (H)            02/09/2021                HGB                      12.5 (L)            02/09/2021                HCT                      39.7                02/09/2021                MCV                      86.9                02/09/2021                PLT  360                 02/09/2021           Lab Results      Component                Value               Date                      NA                       137                 02/09/2021                K                        3.2 (L)             02/09/2021                CO2                      25                  02/09/2021                GLUCOSE                  90                  02/09/2021                BUN                      20                  02/09/2021                CREATININE               1.56 (H)            02/09/2021                CALCIUM                  8.7 (L)             02/09/2021                GFRNONAA                 49 (L)              02/09/2021           ECHO 2019: - Left ventricle: The cavity size was normal. Wall thickness was  increased in a pattern of mild LVH. Systolic function was mildly  reduced. The estimated ejection fraction was in the range of 45%  to 50%. There is hypokinesis of the basal-midinferolateral  myocardium. Features are consistent with a pseudonormal left  ventricular filling pattern, with concomitant abnormal relaxation  and increased filling pressure (grade 2 diastolic dysfunction).  - Aortic valve: Mildly calcified annulus. Trileaflet; mildly  calcified leaflets.  - Mitral valve: Mildly calcified annulus. There was mild  regurgitation.  - Left atrium: The atrium was  mildly dilated.  - Tricuspid valve: There was trivial regurgitation.  - Pulmonary arteries: Systolic pressure could not be accurately  estimated.  - Pericardium, extracardiac: A trivial  pericardial effusion was  identified anterior to the heart. )        Anesthesia Quick Evaluation

## 2021-02-15 NOTE — Anesthesia Procedure Notes (Signed)
Arterial Line Insertion Start/End1/16/2023 9:05 AM, 02/15/2021 9:20 AM Performed by: Josephine Igo, CRNA, CRNA  Patient location: Pre-op. Preanesthetic checklist: patient identified, IV checked, risks and benefits discussed and surgical consent Right, radial was placed Catheter size: 20 G Hand hygiene performed  and maximum sterile barriers used   Attempts: 1 Procedure performed without using ultrasound guided technique. Following insertion, dressing applied and Biopatch. Post procedure assessment: normal  Patient tolerated the procedure well with no immediate complications.

## 2021-02-15 NOTE — Progress Notes (Signed)
Patient received Hydralazine 5 mg IV per Dr. Gloris Manchester verbal order. BP decreased to 161/66. Patient denied any distress. Will continue to monitor.   Patient received Aspirin 81 mg per Dr. Carlis Abbott verbal order.

## 2021-02-15 NOTE — Anesthesia Procedure Notes (Signed)
Procedure Name: Intubation Date/Time: 02/15/2021 11:10 AM Performed by: Dorann Lodge, CRNA Pre-anesthesia Checklist: Patient identified, Emergency Drugs available, Suction available and Patient being monitored Patient Re-evaluated:Patient Re-evaluated prior to induction Oxygen Delivery Method: Circle System Utilized Preoxygenation: Pre-oxygenation with 100% oxygen Induction Type: IV induction Ventilation: Mask ventilation without difficulty Laryngoscope Size: Mac and 4 Grade View: Grade II Tube type: Oral Tube size: 7.5 mm Number of attempts: 1 Airway Equipment and Method: Stylet and Oral airway Placement Confirmation: ETT inserted through vocal cords under direct vision, positive ETCO2 and breath sounds checked- equal and bilateral Secured at: 23 cm Tube secured with: Tape Dental Injury: Teeth and Oropharynx as per pre-operative assessment

## 2021-02-15 NOTE — H&P (Signed)
History and Physical Interval Note:  02/15/2021 10:20 AM  Michael [name]  has presented today for surgery, with the diagnosis of right carotid stenosis.  The various methods of treatment have been discussed with the patient and family. After consideration of risks, benefits and other options for treatment, the patient has consented to  Procedure(s): RIGHT TRANSCAROTID ARTERY REVASCULARIZATION (Right) as a surgical intervention.  The patient's history has been reviewed, patient examined, no change in status, stable for surgery.  I have reviewed the patient's chart and labs.  Questions were answered to the patient's satisfaction.    Right TCAR for asymptomatic high grade stenosis.  Compliant with Plavix now.  Has taken Bactim for UTI.  Discussed risk and benefits again including 1% risk of perioperative stroke.  [name] Heck  [facility] [place]   Patient name: Michael [name]           MRN: [id number]        DOB: [birth year]          Sex: male   REASON FOR VISIT: Continued discussion of severe asymptomatic right internal carotid artery stenosis   HPI: Michael [name] is a 66 y.o. male here today for discussion of right internal carotid artery stenosis.  He has a complex past history.  He had discovery of a high-grade asymptomatic stenosis prior to 2019.  He had a right facial basal cell carcinoma and underwent extensive resection with a partial partial mastoidectomy and partial rhinectomy and had free flap from his left arm.  He had been seeing Dr. Juanda Crumble [place] in our office and felt that it would be appropriate to complete his surgical and potential radiation therapy treatment prior to addressing his asymptomatic carotid disease.  He was seen in March and plan was to obtain a CT angiogram in preparation for probable right TCAR.  He was found to have elevated creatinine and therefore had his CT scan postponed.  This was finally accomplished on 12/14/2020 and he is here today  for discussion of this.  Fortunately he has remained asymptomatic.       Past Medical History:  Diagnosis Date   Carotid artery disease (HCC)     Chronic diastolic heart failure (HCC)     CKD (chronic kidney disease) stage 3, GFR 30-59 ml/min (HCC)     COPD (chronic obstructive pulmonary disease) (HCC)     Coronary artery disease     Essential hypertension     Head and neck cancer ([place]) 2019    Right facial basal cell carcinoma s/p resection with right partial mastectomy and partal rhinectomy with skin graft (06/13/17)   Iron deficiency anemia     Urinary retention             Family History  Problem Relation Age of Onset   Stroke Father     Cirrhosis Mother     Colon cancer Neg Hx        SOCIAL HISTORY: Social History         Tobacco Use   Smoking status: Every Day      Packs/day: 0.50      Years: 40.00      Pack years: 20.00      Types: Cigarettes      Last attempt to quit: 12/01/2019      Years since quitting: 1.1   Smokeless tobacco: Never  Substance Use Topics   Alcohol use: Not Currently      No Known Allergies  Current Outpatient Medications  Medication Sig Dispense Refill   aspirin EC 81 MG tablet Take 81 mg by mouth daily.       atorvastatin (LIPITOR) 40 MG tablet Take 1 tablet (40 mg total) by mouth every evening. 90 tablet 3   ergocalciferol (VITAMIN D2) 1.25 MG (50000 UT) capsule Take 1 capsule (50,000 Units total) by mouth once a week. 5 capsule 6   ferrous sulfate 325 (65 FE) MG EC tablet Take 1 tablet (325 mg total) by mouth daily with breakfast. 30 tablet 3   isosorbide mononitrate (IMDUR) 30 MG 24 hr tablet Take 1 tablet (30 mg total) by mouth every evening. 90 tablet 3   metoprolol succinate (TOPROL-XL) 25 MG 24 hr tablet Take 1 tablet (25 mg total) by mouth every evening. 90 tablet 3   sulfamethoxazole-trimethoprim (BACTRIM DS) 800-160 MG tablet Take 1 tablet by mouth every 12 (twelve) hours. 14 tablet 0   temazepam (RESTORIL) 15 MG  capsule Take 1 capsule (15 mg total) by mouth at bedtime as needed for sleep. 30 capsule 0   Vitamin D, Cholecalciferol, 25 MCG (1000 UT) CAPS Take 1 capsule by mouth daily.       lisinopril (ZESTRIL) 10 MG tablet Take 10 mg by mouth daily. (Patient not taking: Reported on 01/08/2021)       nitroGLYCERIN (NITROSTAT) 0.4 MG SL tablet Place 1 tablet (0.4 mg total) under the tongue every 5 (five) minutes as needed for chest pain. 90 tablet 3    No current facility-administered medications for this visit.      REVIEW OF SYSTEMS:  [X]  denotes positive finding, [ ]  denotes negative finding Cardiac   Comments:  Chest pain or chest pressure:      Shortness of breath upon exertion:      Short of breath when lying flat:      Irregular heart rhythm:             Vascular      Pain in calf, thigh, or hip brought on by ambulation:      Pain in feet at night that wakes you up from your sleep:       Blood clot in your veins:      Leg swelling:                  PHYSICAL EXAM:       Vitals:    01/13/21 0925 01/13/21 0926 01/13/21 0927 01/13/21 0928  BP: (!) 208/101 (!) 196/113 (!) 204/101 (!) 191/84  Pulse: 88        Resp: 20        Temp: 97.9 F (36.6 C)        TempSrc: Oral        SpO2: 98%        Weight: 168 lb 3.2 oz (76.3 kg)        Height: 6' (1.829 m)            GENERAL: The patient is a well-nourished male, in no acute distress. The vital signs are documented above.  Extensive resection of his right nose and face with reconstruction. CARDIOVASCULAR: Carotid bruit on the right no on the left.  2+ radial pulses bilaterally PULMONARY: There is good air exchange  MUSCULOSKELETAL: There are no major deformities or cyanosis. NEUROLOGIC: No focal weakness or paresthesias are detected. SKIN: There are no ulcers or rashes noted. PSYCHIATRIC: The patient has a normal affect.   DATA:  I reviewed his CT  scan and discussed this at length with the patient.  He does have a high-grade stenosis  that is now progressed to a string sign on the right.  This is compared to his 2020 CTA.  He does have function functionally occluded left vertebral artery with moderate to severe tandem lesions in his right vertebral artery.   MEDICAL ISSUES: I discussed options with the patient.  Dr. Oneida Alar had recommended TCAR.  I did review the films with Dr. Fortunato Curling in [place] who also reviewed these with the [facility] clinical representative.  All are in agreement that TCAR would be the best treatment option for this patient.  I discussed the procedure in length with the patient and also the slight risk for perioperative stroke.  I did offer in person visit with Dr. Carlis Abbott in [place] but he is comfortable with our discussion and will meet him on the day of procedure.  We will plan to proceed with right TCAR at this patient's convenience       Rosetta Posner, MD [facility] Tel [phone]

## 2021-02-16 ENCOUNTER — Encounter: Payer: Self-pay | Admitting: Internal Medicine

## 2021-02-16 ENCOUNTER — Inpatient Hospital Stay (HOSPITAL_COMMUNITY): Payer: Medicare Other

## 2021-02-16 ENCOUNTER — Encounter (HOSPITAL_COMMUNITY): Payer: Self-pay | Admitting: Vascular Surgery

## 2021-02-16 LAB — BASIC METABOLIC PANEL
Anion gap: 10 (ref 5–15)
BUN: 38 mg/dL — ABNORMAL HIGH (ref 8–23)
CO2: 18 mmol/L — ABNORMAL LOW (ref 22–32)
Calcium: 8.4 mg/dL — ABNORMAL LOW (ref 8.9–10.3)
Chloride: 106 mmol/L (ref 98–111)
Creatinine, Ser: 2.38 mg/dL — ABNORMAL HIGH (ref 0.61–1.24)
GFR, Estimated: 29 mL/min — ABNORMAL LOW (ref >60)
Glucose, Bld: 175 mg/dL — ABNORMAL HIGH (ref 70–99)
Potassium: 4.6 mmol/L (ref 3.5–5.1)
Sodium: 134 mmol/L — ABNORMAL LOW (ref 135–145)

## 2021-02-16 LAB — LIPID PANEL
Cholesterol: 146 mg/dL (ref 0–200)
HDL: 38 mg/dL — ABNORMAL LOW (ref >40)
LDL Cholesterol: 93 mg/dL (ref 0–99)
Total CHOL/HDL Ratio: 3.8 RATIO
Triglycerides: 76 mg/dL (ref <150)
VLDL: 15 mg/dL (ref 0–40)

## 2021-02-16 LAB — CBC
HCT: 29.8 % — ABNORMAL LOW (ref 39.0–52.0)
Hemoglobin: 9.3 g/dL — ABNORMAL LOW (ref 13.0–17.0)
MCH: 26.6 pg (ref 26.0–34.0)
MCHC: 31.2 g/dL (ref 30.0–36.0)
MCV: 85.4 fL (ref 80.0–100.0)
Platelets: 268 10*3/uL (ref 150–400)
RBC: 3.49 MIL/uL — ABNORMAL LOW (ref 4.22–5.81)
RDW: 14.5 % (ref 11.5–15.5)
WBC: 7 10*3/uL (ref 4.0–10.5)
nRBC: 0 % (ref 0.0–0.2)

## 2021-02-16 MED ORDER — ATORVASTATIN CALCIUM 80 MG PO TABS
80.0000 mg | ORAL_TABLET | Freq: Every evening | ORAL | Status: DC
  Administered 2021-02-16 – 2021-02-17 (×2): 80 mg via ORAL
  Filled 2021-02-16 (×2): qty 1

## 2021-02-16 MED ORDER — TAMSULOSIN HCL 0.4 MG PO CAPS
0.4000 mg | ORAL_CAPSULE | Freq: Every day | ORAL | Status: DC
  Administered 2021-02-16 – 2021-02-18 (×3): 0.4 mg via ORAL
  Filled 2021-02-16 (×3): qty 1

## 2021-02-16 NOTE — Progress Notes (Signed)
°  Pt is alert and fully oriented x 4. No new evident of neurological deficits. He is hemodynamically stable. He has tolerated pain well. He had nausea and vomiting with undigested food a couple times. Zofran was given.   Pt is due to void at 0930 pm. Bladder scan 1st time was done at 0945 pm found 380 ml. Pt was unable to urinate, no urging sensation. 2nd bladder scan found urine retention 520 ml. Intermittent cath performed with no difficulties. We got 700 ml of clear and yellow urine output. We will monitor.     02/16/21 0020  Urine Characteristics  Urinary Interventions Bladder scan  Bladder Scan Volume (mL) 520 mL  Intermittent/Straight Cath (mL) 700 mL  Intermittent Catheter Size 16   Lorrinda Ramstad, RN

## 2021-02-16 NOTE — Progress Notes (Signed)
Mobility Specialist: Progress Note ° ° 02/16/21 1553  °Mobility  °Activity Ambulated independently in hallway  °Level of Assistance Independent  °Assistive Device None  °Distance Ambulated (ft) 350 ft  °Activity Response Tolerated well  °$Mobility charge 1 Mobility  ° °Pre-Mobility: 73 HR °Post-Mobility: 74 HR ° °Received pt in bed having no complaints and agreeable to mobility. Asymptomatic throughout ambulation, returned back to bed w/ call bell in reach and all needs met. ° °Christopher Day °Mobility Specialist °Mobility Specialist 4 East: [phone] °Mobility Specialist 2 Central and 6 East: [phone] ° °

## 2021-02-16 NOTE — Progress Notes (Signed)
PHARMACIST LIPID MONITORING   Michael [name] is a 66 y.o. male admitted on 02/15/2021 with internal carotid artery stenosis.  Pharmacy has been consulted to optimize lipid-lowering therapy with the indication of secondary prevention for clinical ASCVD.  Recent Labs:  Lipid Panel (last 6 months):   Lab Results  Component Value Date   CHOL 146 02/16/2021   TRIG 76 02/16/2021   HDL 38 (L) 02/16/2021   CHOLHDL 3.8 02/16/2021   VLDL 15 02/16/2021   LDLCALC 93 02/16/2021    Hepatic function panel (last 6 months):   Lab Results  Component Value Date   AST 29 02/09/2021   ALT 27 02/09/2021   ALKPHOS 164 (H) 02/09/2021   BILITOT 0.5 02/09/2021    SCr (since admission):   Serum creatinine: 2.38 mg/dL (H) 02/16/21 0400 Estimated creatinine clearance: 32.7 mL/min (A)  Current therapy and lipid therapy tolerance Current lipid-lowering therapy: atorvastatin 40mg  daily Documented or reported allergies or intolerances to lipid-lowering therapies (if applicable): None  Assessment:   Patient agrees with changes to lipid-lowering therapy  Plan:    1.Statin intensity (high intensity recommended for all patients regardless of the LDL):  Increase atorvastatin to 80mg  po daily  2.Add ezetimibe (if any one of the following):   Not indicated at this time.  3.Refer to lipid clinic:   Yes. With LDL of 93 and goal LDL  < 55 starting a PCSK9 inhibitor would be best  4.Follow-up with:  Cardiology provider - Rozann Lesches, MD  5.Follow-up labs after discharge:  No changes in lipid therapy, repeat a lipid panel in one year.     Hildred Laser, PharmD Clinical Pharmacist **Pharmacist phone directory can now be found on [email or web address] ([email or web address]).  Listed under [place].

## 2021-02-16 NOTE — Progress Notes (Signed)
°   02/16/21 3300  Urine Characteristics  Urinary Interventions Bladder scan  Bladder Scan Volume (mL) 142 mL   We repeated bladder scanning 6 hours after in and out cath. We will monitor.  Kennyth Lose, RN

## 2021-02-16 NOTE — Progress Notes (Addendum)
Progress Note    02/16/2021 8:01 AM 1 Day Post-Op  Subjective:  no major complaints   Vitals:   02/16/21 0500 02/16/21 0600  BP: (!) 110/58 (!) 121/55  Pulse: (!) 56   Resp: 13 14  Temp:    SpO2: 98%    Physical Exam: Cardiac:  regular Lungs:  non labored Incisions:  right neck incision c/d/I without swelling or hematoma Extremities: moving all extremities without deficits Neurologic: alert and oriented. Tongue midline. Speech coherent. Sensation is intact and equal bilaterally  CBC    Component Value Date/Time   WBC 7.0 02/16/2021 0400   RBC 3.49 (L) 02/16/2021 0400   HGB 9.3 (L) 02/16/2021 0400   HGB 13.8 04/23/2011 0919   HCT 29.8 (L) 02/16/2021 0400   HCT 26.0 (L) 05/09/2018 0455   PLT 268 02/16/2021 0400   PLT 208 04/23/2011 0919   MCV 85.4 02/16/2021 0400   MCV 84 04/23/2011 0919   MCH 26.6 02/16/2021 0400   MCHC 31.2 02/16/2021 0400   RDW 14.5 02/16/2021 0400   RDW 12.3 04/23/2011 0919   LYMPHSABS 1.9 01/12/2021 1031   LYMPHSABS 1.1 04/23/2011 0919   MONOABS 0.8 01/12/2021 1031   MONOABS 0.9 (H) 04/23/2011 0919   EOSABS 0.3 01/12/2021 1031   EOSABS 0.0 04/23/2011 0919   BASOSABS 0.1 01/12/2021 1031   BASOSABS 0.0 04/23/2011 0919    BMET    Component Value Date/Time   NA 134 (L) 02/16/2021 0400   NA 140 04/23/2011 0919   K 4.6 02/16/2021 0400   K 2.4 (LL) 04/23/2011 0919   CL 106 02/16/2021 0400   CL 100 04/23/2011 0919   CO2 18 (L) 02/16/2021 0400   CO2 28 04/23/2011 0919   GLUCOSE 175 (H) 02/16/2021 0400   GLUCOSE 101 (H) 04/23/2011 0919   BUN 38 (H) 02/16/2021 0400   BUN 17 04/23/2011 0919   CREATININE 2.38 (H) 02/16/2021 0400   CREATININE 1.18 04/23/2011 0919   CALCIUM 8.4 (L) 02/16/2021 0400   CALCIUM 8.4 (L) 04/23/2011 0919   GFRNONAA 29 (L) 02/16/2021 0400   GFRNONAA >60 04/23/2011 0919   GFRAA 34 (L) 10/02/2019 1001   GFRAA >60 04/23/2011 0919    INR    Component Value Date/Time   INR 1.1 02/09/2021 1348      Intake/Output Summary (Last 24 hours) at 02/16/2021 0801 Last data filed at 02/16/2021 0112 Gross per 24 hour  Intake 1696.24 ml  Output 1020 ml  Net 676.24 ml     Assessment/Plan:  66 y.o. male is s/p right TCAR 1 Day Post-Op   Neurologically intact Right neck incision c/d/I without swelling or hematoma No pain Left femoral access site without swelling or hematoma Scr up to 2.38 from 1.81. Will increase IV fluids. Recheck labs in morning Continue Aspirin, Statin, Plavix Will watch over night with repeat labs in morning. If Renal function improved d/c tomorrow   DVT prophylaxis:  sq heparin   Karoline Caldwell, PA-C [facility] [phone] 02/16/2021 8:01 AM  I have seen and evaluated the patient. I agree with the PA note as documented above.  Postop day 1 status post right TCAR for asymptomatic high-grade stenosis in the setting of previous head and neck surgery with radiation.  Looks good this morning and neck incision is clean dry and intact and neurologically intact.  Serum creatinine did increase from 1.8 to 2.38 and he did get IV dye during the procedure.  We will plan IV hydration today with  a repeat BMP tomorrow.  He has some baseline chronic kidney disease.  Continue aspirin statin Plavix from my standpoint.  Did have to get I&O x1 last night for urinary retention and will monitor today.  Hopefully if his kidney function improves tomorrow with gentle hydration we can plan for discharge tomorrow.  [name] Heck, MD [facility] [place] Office: [phone]

## 2021-02-17 DIAGNOSIS — I6521 Occlusion and stenosis of right carotid artery: Secondary | ICD-10-CM

## 2021-02-17 LAB — BASIC METABOLIC PANEL
Anion gap: 7 (ref 5–15)
BUN: 46 mg/dL — ABNORMAL HIGH (ref 8–23)
CO2: 20 mmol/L — ABNORMAL LOW (ref 22–32)
Calcium: 8.2 mg/dL — ABNORMAL LOW (ref 8.9–10.3)
Chloride: 111 mmol/L (ref 98–111)
Creatinine, Ser: 2.46 mg/dL — ABNORMAL HIGH (ref 0.61–1.24)
GFR, Estimated: 28 mL/min — ABNORMAL LOW (ref >60)
Glucose, Bld: 124 mg/dL — ABNORMAL HIGH (ref 70–99)
Potassium: 4.2 mmol/L (ref 3.5–5.1)
Sodium: 138 mmol/L (ref 135–145)

## 2021-02-17 LAB — POCT ACTIVATED CLOTTING TIME
Activated Clotting Time: 245 seconds
Activated Clotting Time: 263 seconds
Activated Clotting Time: 245 seconds

## 2021-02-17 NOTE — Progress Notes (Signed)
Pt ambulated approx 430 ft. Back to bed. HR stayed in 70's.   Chrisandra Carota, RN 02/17/2021 5:13 PM

## 2021-02-17 NOTE — Progress Notes (Addendum)
°  Progress Note    02/17/2021 7:45 AM 2 Days Post-Op  Subjective:  no complaints.  RN reports he did not have to have I&O cath and has been voiding on his own.  Pt walked yesterday  Afebrile HR 50's-60's SB 26'J-335'K systolic 56% RA  Vitals:   02/16/21 2313 02/17/21 0430  BP: (!) 101/42 (!) 97/48  Pulse: 62 60  Resp: 20 15  Temp: (!) 97.5 F (36.4 C) (!) 97.5 F (36.4 C)  SpO2: 100% 97%     Physical Exam: Neuro:  in tact; tongue is midline Lungs: non labored Incision:  healing nicely.  CBC    Component Value Date/Time   WBC 7.0 02/16/2021 0400   RBC 3.49 (L) 02/16/2021 0400   HGB 9.3 (L) 02/16/2021 0400   HGB 13.8 04/23/2011 0919   HCT 29.8 (L) 02/16/2021 0400   HCT 26.0 (L) 05/09/2018 0455   PLT 268 02/16/2021 0400   PLT 208 04/23/2011 0919   MCV 85.4 02/16/2021 0400   MCV 84 04/23/2011 0919   MCH 26.6 02/16/2021 0400   MCHC 31.2 02/16/2021 0400   RDW 14.5 02/16/2021 0400   RDW 12.3 04/23/2011 0919   LYMPHSABS 1.9 01/12/2021 1031   LYMPHSABS 1.1 04/23/2011 0919   MONOABS 0.8 01/12/2021 1031   MONOABS 0.9 (H) 04/23/2011 0919   EOSABS 0.3 01/12/2021 1031   EOSABS 0.0 04/23/2011 0919   BASOSABS 0.1 01/12/2021 1031   BASOSABS 0.0 04/23/2011 0919    BMET    Component Value Date/Time   NA 138 02/17/2021 0205   NA 140 04/23/2011 0919   K 4.2 02/17/2021 0205   K 2.4 (LL) 04/23/2011 0919   CL 111 02/17/2021 0205   CL 100 04/23/2011 0919   CO2 20 (L) 02/17/2021 0205   CO2 28 04/23/2011 0919   GLUCOSE 124 (H) 02/17/2021 0205   GLUCOSE 101 (H) 04/23/2011 0919   BUN 46 (H) 02/17/2021 0205   BUN 17 04/23/2011 0919   CREATININE 2.46 (H) 02/17/2021 0205   CREATININE 1.18 04/23/2011 0919   CALCIUM 8.2 (L) 02/17/2021 0205   CALCIUM 8.4 (L) 04/23/2011 0919   GFRNONAA 28 (L) 02/17/2021 0205   GFRNONAA >60 04/23/2011 0919   GFRAA 34 (L) 10/02/2019 1001   GFRAA >60 04/23/2011 0919     Intake/Output Summary (Last 24 hours) at 02/17/2021 0745 Last data  filed at 02/17/2021 0530 Gross per 24 hour  Intake 2307.14 ml  Output 1400 ml  Net 907.14 ml     Assessment/Plan:  This is a 66 y.o. male who is s/p right TCAR  2 Days Post-Op  -pt is doing well this am.  BUN/Cr up slightly from yesterday.  IVF continue to run at 100cc/hr.  Lisinopril on hold due to increase in creatinine.  May need another day of IVF-will d/w MD. -pt neuro exam is in tact -pt has ambulated -pt has voided-has not required I&O over 24 hrs and was able to void on his own.    Leontine Locket, PA-C [facility] [phone]  I have independently interviewed and examined patient and agree with PA assessment and plan above. Recheck bmp tomorrow a.m.   Runa Whittingham C. Donzetta Matters, MD [facility] [place] Office: [phone] Pager: [phone]

## 2021-02-17 NOTE — Progress Notes (Signed)
Mobility Specialist: Progress Note   02/17/21 1212  Mobility  Activity Ambulated independently in hallway  Level of Assistance Independent  Assistive Device None  Distance Ambulated (ft) 370 ft  Activity Response Tolerated well  $Mobility charge 1 Mobility   Post-Mobility: 66 HR  Received pt in chair having no complaints and agreeable to mobility. Asymptomatic throughout ambulation, returned back to bed w/ call bell in reach and all needs met.  [facility] Michael [name] Mobility Specialist Mobility Specialist 4 [place]: [phone] Mobility Specialist 2 [place]: [phone]

## 2021-02-17 NOTE — Plan of Care (Signed)
°  Problem: Education: Goal: Knowledge of secondary prevention will improve (SELECT ALL) Outcome: Progressing   Problem: Clinical Measurements: Goal: Ability to maintain clinical measurements within normal limits will improve Outcome: Progressing   Problem: Clinical Measurements: Goal: Cardiovascular complication will be avoided Outcome: Progressing   Problem: Pain Managment: Goal: General experience of comfort will improve Outcome: Progressing   Problem: Skin Integrity: Goal: Risk for impaired skin integrity will decrease Outcome: Progressing

## 2021-02-18 ENCOUNTER — Encounter (HOSPITAL_COMMUNITY): Payer: Self-pay | Admitting: Hematology

## 2021-02-18 ENCOUNTER — Other Ambulatory Visit (HOSPITAL_COMMUNITY): Payer: Self-pay

## 2021-02-18 LAB — BASIC METABOLIC PANEL
Anion gap: 6 (ref 5–15)
BUN: 40 mg/dL — ABNORMAL HIGH (ref 8–23)
CO2: 20 mmol/L — ABNORMAL LOW (ref 22–32)
Calcium: 8.2 mg/dL — ABNORMAL LOW (ref 8.9–10.3)
Chloride: 116 mmol/L — ABNORMAL HIGH (ref 98–111)
Creatinine, Ser: 1.92 mg/dL — ABNORMAL HIGH (ref 0.61–1.24)
GFR, Estimated: 38 mL/min — ABNORMAL LOW (ref >60)
Glucose, Bld: 105 mg/dL — ABNORMAL HIGH (ref 70–99)
Potassium: 4.4 mmol/L (ref 3.5–5.1)
Sodium: 142 mmol/L (ref 135–145)

## 2021-02-18 MED ORDER — ATORVASTATIN CALCIUM 80 MG PO TABS
80.0000 mg | ORAL_TABLET | Freq: Every evening | ORAL | 3 refills | Status: DC
  Filled 2021-02-18: qty 90, 90d supply, fill #0

## 2021-02-18 NOTE — Progress Notes (Signed)
Pt being d/c, VSS, IV removed, education complete.   Chrisandra Carota, RN 02/18/2021 11:19 AM

## 2021-02-18 NOTE — TOC Transition Note (Signed)
Transition of Care (TOC) - CM/SW Discharge Note Marvetta Gibbons RN, BSN Transitions of Care Unit 4E- RN Case Manager See Treatment Team for direct phone #    Patient Details  Name: Michael [name] MRN: [id number] Date of Birth: December 02, 1955  Transition of Care [facility]) CM/SW Contact:  Dawayne Patricia, RN Phone Number: 02/18/2021, 11:33 AM   Clinical Narrative:    Pt from home s/p TCAR, plan to return home. Transition of Care Department [facility]) has reviewed patient and no TOC needs have been identified at this time.   Final next level of care: Home/Self Care Barriers to Discharge: No Barriers Identified   Patient Goals and CMS Choice Patient states their goals for this hospitalization and ongoing recovery are:: return home   Choice offered to / list presented to : NA  Discharge Placement               home        Discharge Plan and Services                DME Arranged: N/A DME Agency: NA       HH Arranged: NA HH Agency: NA        Social Determinants of Health (SDOH) Interventions     Readmission Risk Interventions Readmission Risk Prevention Plan 02/18/2021  Transportation Screening Complete  PCP or Specialist Appt within 5-7 Days Complete  Home Care Screening Complete  Medication Review (RN CM) Complete  Some recent data might be hidden

## 2021-02-18 NOTE — Care Management Important Message (Signed)
Important Message  Patient Details  Name: Michael [name] MRN: [id number] Date of Birth: 03-17-1955   Medicare Important Message Given:  Yes     Shelda Altes 02/18/2021, 7:25 AM

## 2021-02-18 NOTE — Discharge Instructions (Signed)
   [facility] [place]  Discharge Instructions   Carotid Endarterectomy (CEA)  Please refer to the following instructions for your post-procedure care. Your surgeon or physician assistant will discuss any changes with you.  Activity  You are encouraged to walk as much as you can. You can slowly return to normal activities but must avoid strenuous activity and heavy lifting until your doctor tell you it's OK. Avoid activities such as vacuuming or swinging a golf club. You can drive after one week if you are comfortable and you are no longer taking prescription pain medications. It is normal to feel tired for serval weeks after your surgery. It is also normal to have difficulty with sleep habits, eating, and bowel movements after surgery. These will go away with time.  Bathing/Showering  You may shower after you come home. Do not soak in a bathtub, hot tub, or swim until the incision heals completely.  Incision Care  Shower every day. Clean your incision with mild soap and water. Pat the area dry with a clean towel. You do not need a bandage unless otherwise instructed. Do not apply any ointments or creams to your incision. You may have skin glue on your incision. Do not peel it off. It will come off on its own in about one week. Your incision may feel thickened and raised for several weeks after your surgery. This is normal and the skin will soften over time. For Men Only: It's OK to shave around the incision but do not shave the incision itself for 2 weeks. It is common to have numbness under your chin that could last for several months.  Diet  Resume your normal diet. There are no special food restrictions following this procedure. A low fat/low cholesterol diet is recommended for all patients with vascular disease. In order to heal from your surgery, it is CRITICAL to get adequate nutrition. Your body requires vitamins, minerals, and protein. Vegetables are the best  source of vitamins and minerals. Vegetables also provide the perfect balance of protein. Processed food has little nutritional value, so try to avoid this.        Medications  Resume taking all of your medications unless your doctor or physician assistant tells you not to. If your incision is causing pain, you may take over-the- counter pain relievers such as acetaminophen (Tylenol). If you were prescribed a stronger pain medication, please be aware these medications can cause nausea and constipation. Prevent nausea by taking the medication with a snack or meal. Avoid constipation by drinking plenty of fluids and eating foods with a high amount of fiber, such as fruits, vegetables, and grains. Do not take Tylenol if you are taking prescription pain medications.  Follow Up  Our office will schedule a follow up appointment 2-3 weeks following discharge.  Please call us immediately for any of the following conditions  Increased pain, redness, drainage (pus) from your incision site. Fever of 101 degrees or higher. If you should develop stroke (slurred speech, difficulty swallowing, weakness on one side of your body, loss of vision) you should call 911 and go to the nearest emergency room.  Reduce your risk of vascular disease:  Stop smoking. If you would like help call [facility] at 1-800-QUIT-NOW (1-800-784-8669) or [place] at 336-586-4000. Manage your cholesterol Maintain a desired weight Control your diabetes Keep your blood pressure down  If you have any questions, please call the office at [phone].   

## 2021-02-18 NOTE — Progress Notes (Signed)
°  Progress Note    02/18/2021 9:46 AM 3 Days Post-Op  Subjective:  no complaints  Vitals:   02/18/21 0308 02/18/21 0856  BP: 140/68 (!) 156/70  Pulse: 67 82  Resp: 19 20  Temp: 98.1 F (36.7 C) 98.1 F (36.7 C)  SpO2: 96% 91%    Physical Exam: Aaox3 Non labored respirations Incision cdi, no hematoma Neuro in tact  CBC    Component Value Date/Time   WBC 7.0 02/16/2021 0400   RBC 3.49 (L) 02/16/2021 0400   HGB 9.3 (L) 02/16/2021 0400   HGB 13.8 04/23/2011 0919   HCT 29.8 (L) 02/16/2021 0400   HCT 26.0 (L) 05/09/2018 0455   PLT 268 02/16/2021 0400   PLT 208 04/23/2011 0919   MCV 85.4 02/16/2021 0400   MCV 84 04/23/2011 0919   MCH 26.6 02/16/2021 0400   MCHC 31.2 02/16/2021 0400   RDW 14.5 02/16/2021 0400   RDW 12.3 04/23/2011 0919   LYMPHSABS 1.9 01/12/2021 1031   LYMPHSABS 1.1 04/23/2011 0919   MONOABS 0.8 01/12/2021 1031   MONOABS 0.9 (H) 04/23/2011 0919   EOSABS 0.3 01/12/2021 1031   EOSABS 0.0 04/23/2011 0919   BASOSABS 0.1 01/12/2021 1031   BASOSABS 0.0 04/23/2011 0919    BMET    Component Value Date/Time   NA 142 02/18/2021 0246   NA 140 04/23/2011 0919   K 4.4 02/18/2021 0246   K 2.4 (LL) 04/23/2011 0919   CL 116 (H) 02/18/2021 0246   CL 100 04/23/2011 0919   CO2 20 (L) 02/18/2021 0246   CO2 28 04/23/2011 0919   GLUCOSE 105 (H) 02/18/2021 0246   GLUCOSE 101 (H) 04/23/2011 0919   BUN 40 (H) 02/18/2021 0246   BUN 17 04/23/2011 0919   CREATININE 1.92 (H) 02/18/2021 0246   CREATININE 1.18 04/23/2011 0919   CALCIUM 8.2 (L) 02/18/2021 0246   CALCIUM 8.4 (L) 04/23/2011 0919   GFRNONAA 38 (L) 02/18/2021 0246   GFRNONAA >60 04/23/2011 0919   GFRAA 34 (L) 10/02/2019 1001   GFRAA >60 04/23/2011 0919    INR    Component Value Date/Time   INR 1.1 02/09/2021 1348     Intake/Output Summary (Last 24 hours) at 02/18/2021 0946 Last data filed at 02/18/2021 0857 Gross per 24 hour  Intake 3414.58 ml  Output 3150 ml  Net 264.58 ml      Assessment/plan:  66 y.o. male is status post right-sided TCAR doing well and creatinine has returned to baseline.  DC home today.   Deval Mroczka C. Donzetta Matters, MD [facility] Office: [phone] Pager: [phone]  02/18/2021 9:46 AM

## 2021-02-22 ENCOUNTER — Ambulatory Visit (HOSPITAL_COMMUNITY): Payer: Medicare HMO

## 2021-02-22 NOTE — Discharge Summary (Signed)
Discharge Summary     Michael [name] [birth year] 66 y.o. male  [id number]  Admission Date: 02/15/2021  Discharge Date: 02/18/21  Physician: Dr. Carlis Abbott  Admission Diagnosis: Carotid stenosis, asymptomatic, right [I65.21]  Discharge Day services:   See progress note 02/18/2021  Hospital Course:  The patient was admitted to the hospital and taken to the operating room on 02/15/2021 and underwent right sided TCAR for asymptomatic high-grade right internal carotid artery stenosis by Dr. Carlis Abbott on 02/15/2021.  He tolerated the procedure well and was admitted to the hospital postoperatively.  He experienced an AKI with greater than 0.5 increase in creatinine.  This improved with IV fluid hydration and avoiding nephrotoxic agents.  On POD #3 he was ready for discharge home.  He will follow-up in office in about 1 month with a carotid duplex.  He did not require any narcotic pain medication at discharge.  He will continue his aspirin, Plavix, statin daily.  He was discharged home in stable condition.  It should also be noted that his neuro status remained at baseline throughout his hospital stay.    No results for input(s): NA, K, CL, CO2, GLUCOSE, BUN, CALCIUM in the last 72 hours.  Invalid input(s): CRATININE No results for input(s): WBC, HGB, HCT, PLT in the last 72 hours. No results for input(s): INR in the last 72 hours.   Discharge Instructions     AMB Referral to [facility]   Complete by: As directed    Reason For Referral: Lipids       Discharge Diagnosis:  Carotid stenosis, asymptomatic, right [I65.21]  Secondary Diagnosis: Patient Active Problem List   Diagnosis Date Noted   Carotid stenosis, asymptomatic, right 02/15/2021   Basal cell carcinoma of [place], right 12/06/2019   Incisional hernia, without obstruction or gangrene 11/15/2019   Elevated PSA 06/19/2019   Benign prostatic hyperplasia with urinary obstruction 10/29/2018   CAP (community acquired  pneumonia) 05/09/2018   Hypertensive emergency 05/09/2018   Acute on chronic combined systolic and diastolic CHF (congestive heart failure) ([place]) 05/09/2018   Lobar pneumonia ([place]) 05/09/2018   Acute on chronic combined systolic and diastolic congestive heart failure ([place]) 05/09/2018   Urinary retention 11/27/2017   Abnormal nuclear stress test 67/01/4579   Complicated UTI (urinary tract infection)    Sepsis ([place]) 09/17/2017   Vasovagal syncope 09/17/2017   Sepsis secondary to UTI ([place]) 09/01/2017   Syncope 07/05/2017   COPD exacerbation ([place]) 07/05/2017   Acute renal failure superimposed on chronic kidney disease ([place]) 07/05/2017   Catheter-associated urinary tract infection ([place]) 07/05/2017   Leukocytosis 07/05/2017   Urinary retention with incomplete bladder emptying 07/05/2017   UTI (urinary tract infection) 07/05/2017   Diarrhea 07/05/2017   Carotid artery disease ([place]) 06/25/2017   AKI (acute kidney injury) ([place])    Orthostatic syncope    Syncope due to orthostatic hypotension 06/24/2017   Heart murmur 06/24/2017   Abnormal EKG 06/24/2017   Abnormal PET scan of colon 05/25/2017   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 05/15/2017   Periapical abscess 04/04/2017   Iron (Fe) deficiency anemia 04/04/2017   Essential hypertension 04/02/2017   Anemia 04/02/2017   Acute hypoxemic respiratory failure ([place]) 04/02/2017   Tobacco abuse 04/02/2017   Chronic diastolic CHF (congestive heart failure) ([place]) 04/02/2017   Elevated troponin 04/02/2017   Thrombocytosis 04/02/2017   Skin lesion of face    Nasal lesion    Past Medical History:  Diagnosis Date   Carotid artery disease ([place])  Chronic diastolic heart failure (HCC)    CKD (chronic kidney disease) stage 3, GFR 30-59 ml/min (HCC)    COPD (chronic obstructive pulmonary disease) (HCC)    Coronary artery disease    Essential hypertension    Head and neck cancer ([place]) 2019   Right facial basal cell carcinoma s/p  resection with right partial mastectomy and partal rhinectomy with skin graft (06/13/17)   Iron deficiency anemia    Urinary retention     Allergies as of 02/18/2021   No Known Allergies      Medication List     STOP taking these medications    sulfamethoxazole-trimethoprim 800-160 MG tablet Commonly known as: BACTRIM DS       TAKE these medications    aspirin EC 81 MG tablet Take 81 mg by mouth daily.   atorvastatin 80 MG tablet Commonly known as: LIPITOR Take 1 tablet (80 mg total) by mouth every evening. What changed:  medication strength how much to take   clopidogrel 75 MG tablet Commonly known as: PLAVIX Take 1 tablet (75 mg total) by mouth daily.   ergocalciferol 1.25 MG (50000 UT) capsule Commonly known as: VITAMIN D2 Take 1 capsule (50,000 Units total) by mouth once a week.   ferrous sulfate 325 (65 FE) MG EC tablet Take 1 tablet (325 mg total) by mouth daily with breakfast.   isosorbide mononitrate 30 MG 24 hr tablet Commonly known as: IMDUR Take 1 tablet (30 mg total) by mouth every evening.   lisinopril 10 MG tablet Commonly known as: ZESTRIL Take 10 mg by mouth daily.   metoprolol succinate 25 MG 24 hr tablet Commonly known as: TOPROL-XL Take 1 tablet (25 mg total) by mouth every evening.   nitroGLYCERIN 0.4 MG SL tablet Commonly known as: NITROSTAT Place 0.4 mg under the tongue every 5 (five) minutes as needed for chest pain.         Discharge Instructions:   [facility] Discharge Instructions Carotid Endarterectomy (CEA)  Please refer to the following instructions for your post-procedure care. Your surgeon or physician assistant will discuss any changes with you.  Activity  You are encouraged to walk as much as you can. You can slowly return to normal activities but must avoid strenuous activity and heavy lifting until your doctor tell you it's OK. Avoid activities such as vacuuming or swinging a  golf club. You can drive after one week if you are comfortable and you are no longer taking prescription pain medications. It is normal to feel tired for serval weeks after your surgery. It is also normal to have difficulty with sleep habits, eating, and bowel movements after surgery. These will go away with time.  Bathing/Showering  You may shower after you come home. Do not soak in a bathtub, hot tub, or swim until the incision heals completely.  Incision Care  Shower every day. Clean your incision with mild soap and water. Pat the area dry with a clean towel. You do not need a bandage unless otherwise instructed. Do not apply any ointments or creams to your incision. You may have skin glue on your incision. Do not peel it off. It will come off on its own in about one week. Your incision may feel thickened and raised for several weeks after your surgery. This is normal and the skin will soften over time. For Men Only: It's OK to shave around the incision but do not shave the incision itself for 2 weeks.  It is common to have numbness under your chin that could last for several months.  Diet  Resume your normal diet. There are no special food restrictions following this procedure. A low fat/low cholesterol diet is recommended for all patients with vascular disease. In order to heal from your surgery, it is CRITICAL to get adequate nutrition. Your body requires vitamins, minerals, and protein. Vegetables are the best source of vitamins and minerals. Vegetables also provide the perfect balance of protein. Processed food has little nutritional value, so try to avoid this.  Medications  Resume taking all of your medications unless your doctor or physician assistant tells you not to.  If your incision is causing pain, you may take over-the- counter pain relievers such as acetaminophen (Tylenol). If you were prescribed a stronger pain medication, please be aware these medications can cause nausea and  constipation.  Prevent nausea by taking the medication with a snack or meal. Avoid constipation by drinking plenty of fluids and eating foods with a high amount of fiber, such as fruits, vegetables, and grains. Do not take Tylenol if you are taking prescription pain medications.  Follow Up  Our office will schedule a follow up appointment 2-3 weeks following discharge.  Please call us immediately for any of the following conditions  Increased pain, redness, drainage (pus) from your incision site. Fever of 101 degrees or higher. If you should develop stroke (slurred speech, difficulty swallowing, weakness on one side of your body, loss of vision) you should call 911 and go to the nearest emergency room.  Reduce your risk of vascular disease:  Stop smoking. If you would like help call [facility] at 1-800-QUIT-NOW (614)052-8314) or [place] at (408)014-7340. Manage your cholesterol Maintain a desired weight Control your diabetes Keep your blood pressure down  If you have any questions, please call the office at [phone].   Disposition: home  Patient's condition: is Good  Follow up: 1. Dr. Carlis Abbott in 4 weeks.   Dagoberto Ligas, PA-C [facility] [phone]   --- For [facility] Registry use ---   Modified Rankin score at D/C (0-6): 0  IV medication needed for:  1. Hypertension: No 2. Hypotension: No  Post-op Complications: No  1. Post-op CVA or TIA: No  If yes: Event classification (right eye, left eye, right cortical, left cortical, verterobasilar, other):   If yes: Timing of event (intra-op, <6 hrs post-op, >=6 hrs post-op, unknown):   2. CN injury: No  If yes: CN  injuried   3. Myocardial infarction: No  If yes: Dx by (EKG or clinical, Troponin):   4.  CHF: No  5.  Dysrhythmia (new): No  6. Wound infection: No  7. Reperfusion symptoms: No  8. Return to OR: No  If yes: return to OR for (bleeding, neurologic, other CEA incision,  other):   Discharge medications: Statin use:  Yes ASA use:  Yes   Beta blocker use:  Yes ACE-Inhibitor use:  Yes  ARB use:  No CCB use: No P2Y12 Antagonist use: Yes, [ x] Plavix, [ ]  Plasugrel, [ ]  Ticlopinine, [ ]  Ticagrelor, [ ]  Other, [ ]  No for medical reason, [ ]  Non-compliant, [ ]  Not-indicated Anti-coagulant use:  No, [ ]  Warfarin, [ ]  Rivaroxaban, [ ]  Dabigatran,

## 2021-02-24 ENCOUNTER — Other Ambulatory Visit: Payer: Self-pay

## 2021-02-24 ENCOUNTER — Inpatient Hospital Stay (HOSPITAL_COMMUNITY): Payer: Medicare Other

## 2021-02-24 VITALS — BP 174/88 | HR 68 | Temp 98.4°F | Resp 18 | Ht 70.75 in | Wt 167.7 lb

## 2021-02-24 DIAGNOSIS — D508 Other iron deficiency anemias: Secondary | ICD-10-CM | POA: Diagnosis not present

## 2021-02-24 DIAGNOSIS — D500 Iron deficiency anemia secondary to blood loss (chronic): Secondary | ICD-10-CM

## 2021-02-24 DIAGNOSIS — K909 Intestinal malabsorption, unspecified: Secondary | ICD-10-CM | POA: Diagnosis not present

## 2021-02-24 MED ORDER — SODIUM CHLORIDE 0.9 % IV SOLN: Freq: Once | INTRAVENOUS | Status: AC

## 2021-02-24 MED ORDER — ACETAMINOPHEN 325 MG PO TABS
650.0000 mg | ORAL_TABLET | Freq: Once | ORAL | Status: AC
  Administered 2021-02-24: 11:00:00 650 mg via ORAL
  Filled 2021-02-24: qty 2

## 2021-02-24 MED ORDER — LORATADINE 10 MG PO TABS
10.0000 mg | ORAL_TABLET | Freq: Once | ORAL | Status: AC
  Administered 2021-02-24: 11:00:00 10 mg via ORAL
  Filled 2021-02-24: qty 1

## 2021-02-24 MED ORDER — SODIUM CHLORIDE 0.9 % IV SOLN
300.0000 mg | Freq: Once | INTRAVENOUS | Status: AC
  Administered 2021-02-24: 12:00:00 300 mg via INTRAVENOUS
  Filled 2021-02-24: qty 300

## 2021-02-24 NOTE — Progress Notes (Signed)
Patient presents today for iron infusion.  Patient is in satisfactory condition with no complaints voiced.  Vital signs are stable.  We will proceed with treatment per MD orders.  Patient tolerated treatment well with no complaints voiced.  Patient left ambulatory in stable condition.  Vital signs stable at discharge.  Follow up as scheduled.    

## 2021-02-24 NOTE — Patient Instructions (Signed)
[facility]  Discharge Instructions: Thank you for choosing [facility] to provide your oncology and hematology care.  If you have a lab appointment with the Cancer Center, please come in thru the Main Entrance and check in at the main information desk.  Wear comfortable clothing and clothing appropriate for easy access to any Portacath or PICC line.   We strive to give you quality time with your provider. You may need to reschedule your appointment if you arrive late (15 or more minutes).  Arriving late affects you and other patients whose appointments are after yours.  Also, if you miss three or more appointments without notifying the office, you may be dismissed from the clinic at the provider's discretion.      For prescription refill requests, have your pharmacy contact our office and allow 72 hours for refills to be completed.        To help prevent nausea and vomiting after your treatment, we encourage you to take your nausea medication as directed.  BELOW ARE SYMPTOMS THAT SHOULD BE REPORTED IMMEDIATELY: *FEVER GREATER THAN 100.4 F (38 C) OR HIGHER *CHILLS OR SWEATING *NAUSEA AND VOMITING THAT IS NOT CONTROLLED WITH YOUR NAUSEA MEDICATION *UNUSUAL SHORTNESS OF BREATH *UNUSUAL BRUISING OR BLEEDING *URINARY PROBLEMS (pain or burning when urinating, or frequent urination) *BOWEL PROBLEMS (unusual diarrhea, constipation, pain near the anus) TENDERNESS IN MOUTH AND THROAT WITH OR WITHOUT PRESENCE OF ULCERS (sore throat, sores in mouth, or a toothache) UNUSUAL RASH, SWELLING OR PAIN  UNUSUAL VAGINAL DISCHARGE OR ITCHING   Items with * indicate a potential emergency and should be followed up as soon as possible or go to the Emergency Department if any problems should occur.  Please show the CHEMOTHERAPY ALERT CARD or IMMUNOTHERAPY ALERT CARD at check-in to the Emergency Department and triage nurse.  Should you have questions after your visit or need to cancel  or reschedule your appointment, please contact [facility] [phone]  and follow the prompts.  Office hours are 8:00 a.m. to 4:30 p.m. Monday - Friday. Please note that voicemails left after 4:00 p.m. may not be returned until the following business day.  We are closed weekends and major holidays. You have access to a nurse at all times for urgent questions. Please call the main number to the clinic [phone] and follow the prompts.  For any non-urgent questions, you may also contact your provider using MyChart. We now offer e-Visits for anyone 18 and older to request care online for non-urgent symptoms. For details visit [email or web address].   Also download the MyChart app! Go to the app store, search "MyChart", open the app, select [facility], and log in with your MyChart username and password.  Due to Covid, a mask is required upon entering the hospital/clinic. If you do not have a mask, one will be given to you upon arrival. For doctor visits, patients may have 1 support person aged 18 or older with them. For treatment visits, patients cannot have anyone with them due to current Covid guidelines and our immunocompromised population.  

## 2021-03-04 ENCOUNTER — Telehealth: Payer: Self-pay | Admitting: Cardiology

## 2021-03-04 NOTE — Telephone Encounter (Signed)
Patient stats he was returning a call

## 2021-03-04 NOTE — Telephone Encounter (Signed)
Pt notified that no one from our office has called according to his chart.

## 2021-03-05 ENCOUNTER — Inpatient Hospital Stay (HOSPITAL_COMMUNITY): Payer: Medicare Other | Attending: Hematology

## 2021-03-05 ENCOUNTER — Encounter (HOSPITAL_COMMUNITY): Payer: Self-pay

## 2021-03-05 ENCOUNTER — Other Ambulatory Visit: Payer: Self-pay

## 2021-03-05 VITALS — BP 164/70 | HR 53 | Temp 96.7°F | Resp 18

## 2021-03-05 DIAGNOSIS — D509 Iron deficiency anemia, unspecified: Secondary | ICD-10-CM | POA: Insufficient documentation

## 2021-03-05 DIAGNOSIS — D500 Iron deficiency anemia secondary to blood loss (chronic): Secondary | ICD-10-CM

## 2021-03-05 MED ORDER — ACETAMINOPHEN 325 MG PO TABS
650.0000 mg | ORAL_TABLET | Freq: Once | ORAL | Status: AC
  Administered 2021-03-05: 650 mg via ORAL
  Filled 2021-03-05: qty 2

## 2021-03-05 MED ORDER — LORATADINE 10 MG PO TABS
10.0000 mg | ORAL_TABLET | Freq: Once | ORAL | Status: AC
  Administered 2021-03-05: 10 mg via ORAL
  Filled 2021-03-05: qty 1

## 2021-03-05 MED ORDER — SODIUM CHLORIDE 0.9 % IV SOLN
300.0000 mg | Freq: Once | INTRAVENOUS | Status: AC
  Administered 2021-03-05: 300 mg via INTRAVENOUS
  Filled 2021-03-05: qty 300

## 2021-03-05 MED ORDER — SODIUM CHLORIDE 0.9 % IV SOLN: Freq: Once | INTRAVENOUS | Status: AC

## 2021-03-05 NOTE — Progress Notes (Signed)
Patient tolerated iron infusion with no complaints voiced. Peripheral IV site clean and dry with good blood return noted before and after infusion. Patient refuses to stay 30 min wait time. Band aid applied. VSS with discharge and left in satisfactory condition with no s/s of distress noted.

## 2021-03-05 NOTE — Patient Instructions (Signed)
[facility]  Discharge Instructions: Thank you for choosing [facility] to provide your oncology and hematology care.  If you have a lab appointment with the [facility], please come in thru the Main Entrance and check in at the main information desk.  Wear comfortable clothing and clothing appropriate for easy access to any Portacath or PICC line.   We strive to give you quality time with your provider. You may need to reschedule your appointment if you arrive late (15 or more minutes).  Arriving late affects you and other patients whose appointments are after yours.  Also, if you miss three or more appointments without notifying the office, you may be dismissed from the clinic at the providers discretion.      For prescription refill requests, have your pharmacy contact our office and allow 72 hours for refills to be completed.    Today you received the following venofer, return as scheduled.   To help prevent nausea and vomiting after your treatment, we encourage you to take your nausea medication as directed.  BELOW ARE SYMPTOMS THAT SHOULD BE REPORTED IMMEDIATELY: *FEVER GREATER THAN 100.4 F (38 C) OR HIGHER *CHILLS OR SWEATING *NAUSEA AND VOMITING THAT IS NOT CONTROLLED WITH YOUR NAUSEA MEDICATION *UNUSUAL SHORTNESS OF BREATH *UNUSUAL BRUISING OR BLEEDING *URINARY PROBLEMS (pain or burning when urinating, or frequent urination) *BOWEL PROBLEMS (unusual diarrhea, constipation, pain near the anus) TENDERNESS IN MOUTH AND THROAT WITH OR WITHOUT PRESENCE OF ULCERS (sore throat, sores in mouth, or a toothache) UNUSUAL RASH, SWELLING OR PAIN  UNUSUAL VAGINAL DISCHARGE OR ITCHING   Items with * indicate a potential emergency and should be followed up as soon as possible or go to the Emergency Department if any problems should occur.  Please show the CHEMOTHERAPY ALERT CARD or IMMUNOTHERAPY ALERT CARD at check-in to the Emergency Department and triage  nurse.  Should you have questions after your visit or need to cancel or reschedule your appointment, please contact [facility] [phone]  and follow the prompts.  Office hours are 8:00 a.m. to 4:30 p.m. Monday - Friday. Please note that voicemails left after 4:00 p.m. may not be returned until the following business day.  We are closed weekends and major holidays. You have access to a nurse at all times for urgent questions. Please call the main number to the clinic [phone] and follow the prompts.  For any non-urgent questions, you may also contact your provider using MyChart. We now offer e-Visits for anyone 67 and older to request care online for non-urgent symptoms. For details visit [email or web address].   Also download the MyChart app! Go to the app store, search "MyChart", open the app, select [facility], and log in with your MyChart username and password.  Due to Covid, a mask is required upon entering the hospital/clinic. If you do not have a mask, one will be given to you upon arrival. For doctor visits, patients may have 1 support person aged 67 or older with them. For treatment visits, patients cannot have anyone with them due to current Covid guidelines and our immunocompromised population.

## 2021-03-08 ENCOUNTER — Ambulatory Visit (INDEPENDENT_AMBULATORY_CARE_PROVIDER_SITE_OTHER): Payer: Medicare Other | Admitting: Pharmacist

## 2021-03-08 ENCOUNTER — Other Ambulatory Visit: Payer: Self-pay

## 2021-03-08 DIAGNOSIS — I6523 Occlusion and stenosis of bilateral carotid arteries: Secondary | ICD-10-CM

## 2021-03-08 NOTE — Patient Instructions (Addendum)
I will submit for approval for Repatha from your insurance. I will call you once approved Please continue atorvastatin 80mg  daily Please call me at [phone] with any questions Please try to increase your physical activity  Tips for living a healthier life     Building a Healthy and Balanced Diet Make most of your meal vegetables and fruits -  of your plate. Aim for color and variety, and remember that potatoes dont count as vegetables on the Healthy Eating Plate because of their negative impact on blood sugar.  Go for whole grains -  of your plate. Whole and intact grains--whole wheat, barley, wheat berries, quinoa, oats, brown rice, and foods made with them, such as whole wheat pasta--have a milder effect on blood sugar and insulin than white bread, white rice, and other refined grains.  Protein power -  of your plate. Fish, poultry, beans, and nuts are all healthy, versatile protein sources--they can be mixed into salads, and pair well with vegetables on a plate. Limit red meat, and avoid processed meats such as bacon and sausage.  Healthy plant oils - in moderation. Choose healthy vegetable oils like olive, canola, soy, corn, sunflower, peanut, and others, and avoid partially hydrogenated oils, which contain unhealthy trans fats. Remember that low-fat does not mean healthy.  Drink water, coffee, or tea. Skip sugary drinks, limit milk and dairy products to one to two servings per day, and limit juice to a small glass per day.  Stay active. The red figure running across the [place] is a reminder that staying active is also important in weight control.  The main message of the Healthy Eating Plate is to focus on diet quality:  The type of carbohydrate in the diet is more important than the amount of carbohydrate in the diet, because some sources of carbohydrate--like vegetables (other than potatoes), fruits, whole grains, and beans--are healthier than  others. The Healthy Eating Plate also advises consumers to avoid sugary beverages, a major source of calories--usually with little nutritional value--in the American diet. The Healthy Eating Plate encourages consumers to use healthy oils, and it does not set a maximum on the percentage of calories people should get each day from healthy sources of fat. In this way, the Healthy Eating Plate recommends the opposite of the low-fat message promoted for decades by the USDA.  DeskDistributor.no  SUGAR  Sugar is a huge problem in the modern day diet. Sugar is a big contributor to heart disease, diabetes, high triglyceride levels, fatty liver disease and obesity. Sugar is hidden in almost all packaged foods/beverages. Added sugar is extra sugar that is added beyond what is naturally found and has no nutritional benefit for your body. The American Heart Association recommends limiting added sugars to no more than 25g for women and 36 grams for men per day. There are many names for sugar including maltose, sucrose (names ending in "ose"), high fructose corn syrup, molasses, cane sugar, corn sweetener, raw sugar, syrup, honey or fruit juice concentrate.   One of the best ways to limit your added sugars is to stop drinking sweetened beverages such as soda, sweet tea, and fruit juice.  There is 65g of added sugars in one 20oz bottle of Coke! That is equal to 7.5 donuts.   Pay attention and read all nutrition facts labels. Below is an examples of a nutrition facts label. The #1 is showing you the total sugars where the # 2 is showing you the added sugars. This  one serving has almost the max amount of added sugars per day!     20 oz Soda 65g Sugar = 7.5 Glazed Donuts  16oz Energy  Drink 54g Sugar = 6.5 Glazed Donuts  Large Sweet  Tea 38g Sugar = 4 Glazed Donuts  20oz Sports  Drink 34g Sugar = 3.5 Glazed Donuts  8oz Chocolate Milk 24g Sugar =2.5  Glazed Donuts  8oz Orange  Juice 21g Sugar = 2 Glazed Donuts  1 Juice Box 14g Sugar = 1.5 Glazed Donuts  16oz Water= NO SUGAR!!  EXERCISE  Exercise is good. Weve all heard that. In an ideal world, we would all have time and resources to get plenty of it. When you are active, your heart pumps more efficiently and you will feel better.  Multiple studies show that even walking regularly has benefits that include living a longer life. The American Heart Association recommends 150 minutes per week of exercise (30 minutes per day most days of the week). You can do this in any increment you wish. Nine or more 10-minute walks count. So does an hour-long exercise class. Break the time apart into what will work in your life. Some of the best things you can do include walking briskly, jogging, cycling or swimming laps. Not everyone is ready to exercise. Sometimes we need to start with just getting active. Here are some easy ways to be more active throughout the day:  Take the stairs instead of the elevator  Go for a 10-15 minute walk during your lunch break (find a friend to make it more enjoyable)  When shopping, park at the back of the parking lot  If you take public transportation, get off one stop early and walk the extra distance  Pace around while making phone calls  Check with your doctor if you arent sure what your limitations may be. Always remember to drink plenty of water when doing any type of exercise. Dont feel like a failure if youre not getting the 90-150 minutes per week. If you started by being a couch potato, then just a 10-minute walk each day is a huge improvement. Start with little victories and work your way up.   HEALTHY EATING TIPS  When looking to improve your eating habits, whether to lose weight, lower blood pressure or just be healthier, it helps to know what a serving size is.   Grains 1 slice of bread,  bagel,  cup pasta or rice  Vegetables 1 cup fresh or raw  vegetables,  cup cooked or canned Fruits 1 piece of medium sized fruit,  cup canned,   Meats/Proteins  cup dried       1 oz meat, 1 egg,  cup cooked beans, nuts or seeds  Dairy        Fats Individual yogurt container, 1 cup (8oz)    1 teaspoon margarine/butter or vegetable  milk or milk alternative, 1 slice of cheese          oil; 1 tablespoon mayonnaise or salad dressing                  Plan ahead: make a menu of the meals for a week then create a grocery list to go with that menu. Consider meals that easily stretch into a night of leftovers, such as stews or casseroles. Or consider making two of your favorite meal and put one in the freezer for another night. Try a night or two each week that is meatless or  no cook such as salads. When you get home from the grocery store wash and prepare your vegetables and fruits. Then when you need them they are ready to go.   Tips for going to the grocery store:  [place] store or generic brands  Check the weekly ad from your store on-line or in their in-store flyer  Look at the unit price on the shelf tag to compare/contrast the costs of different items  Buy fruits/vegetables in season  Carrots, bananas and apples are low-cost, naturally healthy items  If meats or frozen vegetables are on sale, buy some extras and put in your freezer  Limit buying prepared or ready to eat items, even if they are pre-made salads or fruit snacks  Do not shop when youre hungry  Foods at eye level tend to be more expensive. Look on the high and low shelves for deals.  Consider shopping at the farmers market for fresh foods in season.  Avoid the cookie and chip aisles (these are expensive, high in calories and low in nutritional value). Shop on the outside of the grocery store.  Healthy food preparations:  If you cant get lean hamburger, be sure to drain the fat when cooking  Steam, saut (in olive oil), grill or bake foods  Experiment with different seasonings  to avoid adding salt to your foods. Kosher salt, sea salt and Himalayan salt are all still salt and should be avoided. Try seasoning food with onion, garlic, thyme, rosemary, basil ect. Onion powder or garlic powder is ok. Avoid if it says salt (ie garlic salt).

## 2021-03-08 NOTE — Progress Notes (Signed)
Patient ID: Michael [name]                 DOB: 11-Feb-1955                    MRN: [id number]     HPI: Michael [name] is a 66 y.o. male patient referred to lipid clinic by Dr. Domenic Polite. PMH is significant for carotid artery disease, CKD, COPD, head and neck cancer and HTN. He is s/p  right sided TCAR for asymptomatic high-grade right internal carotid artery stenosis. Atorvastatin was increased to 80mg  daily by our inpatient pharmacy team and patient referred to lipid clinic.  Patient presents to lipid clinic. Originally said that he was not interested in needles, but then the more we talked he changed his mind. He has both medicare and medicaid. Current [employer] but tells me its changing to [employer]. Thinks he is taking atorvastatin 80mg  daily without problem. Does not exercise. 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 Drinks Coke and tells me he isn't going to stop. Trying to quite smoking on his own. I congratulated him on his progress.  Current Medications: atorvastatin 80mg  daily Intolerances:  Risk Factors: HTN, CKD, carotid artery disease LDL goal: <55  Diet: chicken, tuna fish, some sweets, green beans, peas, carrots, asparagus,  Drink: coke (refuses to quite), water  Exercise: only walks to mailbox  Family History:  Family History  Problem Relation Age of Onset   Stroke Father    Cirrhosis Mother    Colon cancer Neg Hx     Social History: in the process of quitting smoking, no ETOH, no illicit drugs  Labs: 0/63/01 TC 146, TG 76, HDL 38, LDL 93 (atorvastatin 40mg  daily)  Past Medical History:  Diagnosis Date   Carotid artery disease (HCC)    Chronic diastolic heart failure (HCC)    CKD (chronic kidney disease) stage 3, GFR 30-59 ml/min (HCC)    COPD (chronic obstructive pulmonary disease) ([place])    Coronary artery disease    Essential hypertension    Head and neck cancer ([place]) 2019   Right facial basal cell carcinoma s/p resection with right partial mastectomy and partal rhinectomy with skin graft  (06/13/17)   Iron deficiency anemia    Urinary retention     Current Outpatient Medications on File Prior to Visit  Medication Sig Dispense Refill   aspirin EC 81 MG tablet Take 81 mg by mouth daily.     atorvastatin (LIPITOR) 80 MG tablet Take 1 tablet (80 mg total) by mouth every evening. 90 tablet 3   clopidogrel (PLAVIX) 75 MG tablet Take 1 tablet (75 mg total) by mouth daily. 30 tablet 6   ergocalciferol (VITAMIN D2) 1.25 MG (50000 UT) capsule Take 1 capsule (50,000 Units total) by mouth once a week. (Patient not taking: Reported on 03/05/2021) 5 capsule 6   ferrous sulfate 325 (65 FE) MG EC tablet Take 1 tablet (325 mg total) by mouth daily with breakfast. 30 tablet 3   isosorbide mononitrate (IMDUR) 30 MG 24 hr tablet Take 1 tablet (30 mg total) by mouth every evening. 90 tablet 3   lisinopril (ZESTRIL) 10 MG tablet Take 10 mg by mouth daily.     metoprolol succinate (TOPROL-XL) 25 MG 24 hr tablet Take 1 tablet (25 mg total) by mouth every evening. 90 tablet 3   nitroGLYCERIN (NITROSTAT) 0.4 MG SL tablet Place 0.4 mg under the tongue every 5 (five) minutes as needed for chest pain.  No current facility-administered medications on file prior to visit.    No Known Allergies  Assessment/Plan:  1. Hyperlipidemia - LDL-C is above goal of <55. Patient inially did not want an injection. We discussed alternatives and the LDL-C reduction of them. Reviewed injection technique. Patient then agreeable to try [id number]. I will submit prior authorization for Repatha. Patient encouraged to increase physical activity. Continue atorvastatin 80mg  daily. Labs in 3 months.   Thank you,   Ramond Dial, Pharm.D, BCPS, CPP [facility]  [address] [place], [place] [address]  Phone: [phone]; Fax: [phone]

## 2021-03-09 ENCOUNTER — Telehealth: Payer: Self-pay | Admitting: Pharmacist

## 2021-03-09 MED ORDER — REPATHA SURECLICK 140 MG/ML ~~LOC~~ SOAJ: 1.0000 "pen " | SUBCUTANEOUS | 11 refills | Status: DC

## 2021-03-09 NOTE — Telephone Encounter (Signed)
Repatha approved through 09/05/21. Rx sent to pharmacy. Patient made aware. Will reach out in 2-3 months to set up labs.

## 2021-03-10 ENCOUNTER — Encounter (HOSPITAL_COMMUNITY): Payer: Self-pay | Admitting: Hematology

## 2021-03-12 ENCOUNTER — Other Ambulatory Visit: Payer: Self-pay

## 2021-03-12 ENCOUNTER — Ambulatory Visit: Payer: Medicare HMO | Admitting: Gastroenterology

## 2021-03-12 ENCOUNTER — Encounter (HOSPITAL_COMMUNITY): Payer: Self-pay

## 2021-03-12 ENCOUNTER — Inpatient Hospital Stay (HOSPITAL_COMMUNITY): Payer: Medicare Other

## 2021-03-12 VITALS — BP 120/64 | HR 54 | Temp 96.9°F | Resp 18

## 2021-03-12 DIAGNOSIS — D500 Iron deficiency anemia secondary to blood loss (chronic): Secondary | ICD-10-CM

## 2021-03-12 DIAGNOSIS — D509 Iron deficiency anemia, unspecified: Secondary | ICD-10-CM | POA: Diagnosis not present

## 2021-03-12 DIAGNOSIS — I10 Essential (primary) hypertension: Secondary | ICD-10-CM

## 2021-03-12 MED ORDER — SODIUM CHLORIDE 0.9 % IV SOLN
300.0000 mg | Freq: Once | INTRAVENOUS | Status: AC
  Administered 2021-03-12: 300 mg via INTRAVENOUS
  Filled 2021-03-12: qty 300

## 2021-03-12 MED ORDER — LORATADINE 10 MG PO TABS
10.0000 mg | ORAL_TABLET | Freq: Once | ORAL | Status: AC
  Administered 2021-03-12: 10 mg via ORAL
  Filled 2021-03-12: qty 1

## 2021-03-12 MED ORDER — SODIUM CHLORIDE 0.9 % IV SOLN: Freq: Once | INTRAVENOUS | Status: AC

## 2021-03-12 MED ORDER — CLONIDINE HCL 0.1 MG PO TABS
0.2000 mg | ORAL_TABLET | Freq: Once | ORAL | Status: AC
  Administered 2021-03-12: 0.2 mg via ORAL
  Filled 2021-03-12: qty 2

## 2021-03-12 MED ORDER — ACETAMINOPHEN 325 MG PO TABS
650.0000 mg | ORAL_TABLET | Freq: Once | ORAL | Status: AC
  Administered 2021-03-12: 650 mg via ORAL
  Filled 2021-03-12: qty 2

## 2021-03-12 NOTE — Progress Notes (Signed)
Patient did not take his night time blood pressure medications.  Tarri Abernethy, PA and pharmacy notified.  Clonidine 0.2 mg by mouth verbal order Tarri Abernethy, PA.  No complaints voiced and no s/s of distress noted.    1049-patient complains of right arm peripheral IV hurting.  Infusion stopped.  Right peripheral IV site with tenderness and bruising noted.  Tarri Abernethy, PA notified with verbal orders to use ice for infiltration.  Pharmacy made aware of infiltration of iron.  New peripheral IV started in left arm for rest of iron infusion.  No s/s of distress noted.    1100-patient denied pain with right arm.  Patient did not want to continue the use of the ice pack.  No complaints of tenderness with touch.  Bruising smaller in appearance.  Area soft with touch. No complaints.  No s/s of distress noted.   Patient refused to stay for 30 minutes post iron infusion.  Right peripheral IV site soft to touch.  No complaints of pain with touch.  Reminded the use of an ice pack with understanding verbalized.

## 2021-03-12 NOTE — Patient Instructions (Signed)
[facility]  Discharge Instructions: Thank you for choosing [facility] to provide your oncology and hematology care.  If you have a lab appointment with the Cancer Center, please come in thru the Main Entrance and check in at the main information desk.  Wear comfortable clothing and clothing appropriate for easy access to any Portacath or PICC line.   We strive to give you quality time with your provider. You may need to reschedule your appointment if you arrive late (15 or more minutes).  Arriving late affects you and other patients whose appointments are after yours.  Also, if you miss three or more appointments without notifying the office, you may be dismissed from the clinic at the provider's discretion.      For prescription refill requests, have your pharmacy contact our office and allow 72 hours for refills to be completed.        To help prevent nausea and vomiting after your treatment, we encourage you to take your nausea medication as directed.  BELOW ARE SYMPTOMS THAT SHOULD BE REPORTED IMMEDIATELY: *FEVER GREATER THAN 100.4 F (38 C) OR HIGHER *CHILLS OR SWEATING *NAUSEA AND VOMITING THAT IS NOT CONTROLLED WITH YOUR NAUSEA MEDICATION *UNUSUAL SHORTNESS OF BREATH *UNUSUAL BRUISING OR BLEEDING *URINARY PROBLEMS (pain or burning when urinating, or frequent urination) *BOWEL PROBLEMS (unusual diarrhea, constipation, pain near the anus) TENDERNESS IN MOUTH AND THROAT WITH OR WITHOUT PRESENCE OF ULCERS (sore throat, sores in mouth, or a toothache) UNUSUAL RASH, SWELLING OR PAIN  UNUSUAL VAGINAL DISCHARGE OR ITCHING   Items with * indicate a potential emergency and should be followed up as soon as possible or go to the Emergency Department if any problems should occur.  Please show the CHEMOTHERAPY ALERT CARD or IMMUNOTHERAPY ALERT CARD at check-in to the Emergency Department and triage nurse.  Should you have questions after your visit or need to cancel  or reschedule your appointment, please contact [facility] [phone]  and follow the prompts.  Office hours are 8:00 a.m. to 4:30 p.m. Monday - Friday. Please note that voicemails left after 4:00 p.m. may not be returned until the following business day.  We are closed weekends and major holidays. You have access to a nurse at all times for urgent questions. Please call the main number to the clinic [phone] and follow the prompts.  For any non-urgent questions, you may also contact your provider using MyChart. We now offer e-Visits for anyone 18 and older to request care online for non-urgent symptoms. For details visit [email or web address].   Also download the MyChart app! Go to the app store, search "MyChart", open the app, select [facility], and log in with your MyChart username and password.  Due to Covid, a mask is required upon entering the hospital/clinic. If you do not have a mask, one will be given to you upon arrival. For doctor visits, patients may have 1 support person aged 18 or older with them. For treatment visits, patients cannot have anyone with them due to current Covid guidelines and our immunocompromised population.  

## 2021-03-15 ENCOUNTER — Other Ambulatory Visit: Payer: Self-pay

## 2021-03-15 DIAGNOSIS — I6523 Occlusion and stenosis of bilateral carotid arteries: Secondary | ICD-10-CM

## 2021-03-16 ENCOUNTER — Encounter (HOSPITAL_COMMUNITY): Payer: Self-pay | Admitting: Hematology

## 2021-03-17 NOTE — Progress Notes (Signed)
POST OPERATIVE OFFICE NOTE    CC:  F/u for surgery  HPI:  This is a 66 y.o. male who is s/p right TCAR for asymptomatic carotid artery stenosis on 02/15/2021 by Dr. Carlis Abbott.  Post operatively, he did well but did have a bump in his creatinine to 2.38 from 1.81.  On POD 2, it bumped again slightly to 2.46. His lisinopril was on hold due to this.  By POD 3, his creatinine had returned to baseline and he was discharged home.  The left ICA stenosis was 1-39%.    Pt returns today for follow up.  Pt states he has done well.  He denies any visual changes, speech difficulties, unilateral weakness or paralysis.  He has not had any trouble swallowing.  He has been compliant with his asa/statin/plavix.  He is trying to quit smoking.   No Known Allergies  Current Outpatient Medications  Medication Sig Dispense Refill   aspirin EC 81 MG tablet Take 81 mg by mouth daily.     atorvastatin (LIPITOR) 80 MG tablet Take 1 tablet (80 mg total) by mouth every evening. 90 tablet 3   clopidogrel (PLAVIX) 75 MG tablet Take 1 tablet (75 mg total) by mouth daily. 30 tablet 6   ergocalciferol (VITAMIN D2) 1.25 MG (50000 UT) capsule Take 1 capsule (50,000 Units total) by mouth once a week. 5 capsule 6   Evolocumab (REPATHA SURECLICK) 833 MG/ML SOAJ Inject 1 pen into the skin every 14 (fourteen) days. 2 mL 11   ferrous sulfate 325 (65 FE) MG EC tablet Take 1 tablet (325 mg total) by mouth daily with breakfast. 30 tablet 3   isosorbide mononitrate (IMDUR) 30 MG 24 hr tablet Take 1 tablet (30 mg total) by mouth every evening. 90 tablet 3   lisinopril (ZESTRIL) 10 MG tablet Take 10 mg by mouth daily.     metoprolol succinate (TOPROL-XL) 25 MG 24 hr tablet Take 1 tablet (25 mg total) by mouth every evening. 90 tablet 3   nitroGLYCERIN (NITROSTAT) 0.4 MG SL tablet Place 0.4 mg under the tongue every 5 (five) minutes as needed for chest pain.     No current facility-administered medications for this visit.     ROS:  See  HPI  Physical Exam:  Today's Vitals   03/23/21 1047 03/23/21 1049  BP: (!) 223/119 (!) 215/106  Pulse: 87   Resp: 20   Temp: 98.2 F (36.8 C)   TempSrc: Temporal   SpO2: 98%   Weight: 160 lb 12.8 oz (72.9 kg)   Height: 5' 10.5" (1.791 m)    Body mass index is 22.75 kg/m.   Incision:  healed nicely Extremities:  moving all extremities equally Neuro: intact   Carotid duplex 03/23/2021: Bilateral 1-39% ICA stenosis   Assessment/Plan:  This is a 66 y.o. male who is s/p: right TCAR for asymptomatic carotid artery stenosis on 02/15/2021 by Dr. Carlis Abbott.   -pt seen with Dr. Carlis Abbott and pt doing well and he remains asymptomatic. -duplex today reveals 1-39% bilateral ICA stenosis and patent stent.   His BP today was elevated-he states he takes his medications at night and has not had them today.   -he understands that we will continue to  monitor his ICA stenosis and that if ever got about 80% stenosis, the we would consider intervention. -discussed s/s of stroke with pt and he understands should he develop any of these sx, he will go to the nearest ER or call 911. -pt will f/u  in 9 months with carotid duplex and see Dr. Donnetta Hutching in [place].  Dr. Carlis Abbott talked with pt and said he would be glad to see him here in [place] as well if anything changes.  -praised pt on working on quitting smoking and talked about the importance of this -pt will call sooner should they have any issues. -continue statin/asa/plavix    Leontine Locket, [facility] [phone]   Clinic MD:  Carlis Abbott

## 2021-03-23 ENCOUNTER — Other Ambulatory Visit: Payer: Self-pay

## 2021-03-23 ENCOUNTER — Ambulatory Visit (INDEPENDENT_AMBULATORY_CARE_PROVIDER_SITE_OTHER): Payer: Medicaid Other | Admitting: Physician Assistant

## 2021-03-23 ENCOUNTER — Ambulatory Visit (HOSPITAL_COMMUNITY)
Admission: RE | Admit: 2021-03-23 | Discharge: 2021-03-23 | Disposition: A | Discharge status: Home / Self Care | Payer: Medicare Other | Source: Ambulatory Visit | Attending: Vascular Surgery | Admitting: Vascular Surgery

## 2021-03-23 VITALS — BP 215/106 | HR 87 | Temp 98.2°F | Resp 20 | Ht 70.5 in | Wt 160.8 lb

## 2021-03-23 DIAGNOSIS — I6523 Occlusion and stenosis of bilateral carotid arteries: Secondary | ICD-10-CM | POA: Diagnosis not present

## 2021-03-30 ENCOUNTER — Other Ambulatory Visit: Payer: Self-pay | Admitting: *Deleted

## 2021-03-30 DIAGNOSIS — I6523 Occlusion and stenosis of bilateral carotid arteries: Secondary | ICD-10-CM

## 2021-04-09 ENCOUNTER — Encounter (HOSPITAL_COMMUNITY): Payer: Self-pay | Admitting: Hematology

## 2021-04-12 ENCOUNTER — Encounter (HOSPITAL_COMMUNITY): Payer: Self-pay | Admitting: Hematology

## 2021-04-19 ENCOUNTER — Inpatient Hospital Stay (HOSPITAL_COMMUNITY): Payer: Medicare Other | Attending: Hematology

## 2021-04-19 DIAGNOSIS — D509 Iron deficiency anemia, unspecified: Secondary | ICD-10-CM | POA: Diagnosis not present

## 2021-04-19 DIAGNOSIS — F17210 Nicotine dependence, cigarettes, uncomplicated: Secondary | ICD-10-CM | POA: Diagnosis not present

## 2021-04-19 DIAGNOSIS — N183 Chronic kidney disease, stage 3 unspecified: Secondary | ICD-10-CM | POA: Insufficient documentation

## 2021-04-19 DIAGNOSIS — Z85828 Personal history of other malignant neoplasm of skin: Secondary | ICD-10-CM | POA: Insufficient documentation

## 2021-04-19 DIAGNOSIS — D500 Iron deficiency anemia secondary to blood loss (chronic): Secondary | ICD-10-CM

## 2021-04-19 DIAGNOSIS — I130 Hypertensive heart and chronic kidney disease with heart failure and stage 1 through stage 4 chronic kidney disease, or unspecified chronic kidney disease: Secondary | ICD-10-CM | POA: Insufficient documentation

## 2021-04-19 DIAGNOSIS — Z7982 Long term (current) use of aspirin: Secondary | ICD-10-CM | POA: Insufficient documentation

## 2021-04-19 DIAGNOSIS — Z79899 Other long term (current) drug therapy: Secondary | ICD-10-CM | POA: Insufficient documentation

## 2021-04-19 DIAGNOSIS — I5032 Chronic diastolic (congestive) heart failure: Secondary | ICD-10-CM | POA: Diagnosis not present

## 2021-04-19 DIAGNOSIS — E559 Vitamin D deficiency, unspecified: Secondary | ICD-10-CM

## 2021-04-19 LAB — CBC WITH DIFFERENTIAL/PLATELET
Abs Immature Granulocytes: 0.05 10*3/uL (ref 0.00–0.07)
Basophils Absolute: 0.1 10*3/uL (ref 0.0–0.1)
Basophils Relative: 1 %
Eosinophils Absolute: 0.3 10*3/uL (ref 0.0–0.5)
Eosinophils Relative: 3 %
HCT: 38.4 % — ABNORMAL LOW (ref 39.0–52.0)
Hemoglobin: 12 g/dL — ABNORMAL LOW (ref 13.0–17.0)
Immature Granulocytes: 1 %
Lymphocytes Relative: 23 %
Lymphs Abs: 2.1 10*3/uL (ref 0.7–4.0)
MCH: 27.8 pg (ref 26.0–34.0)
MCHC: 31.3 g/dL (ref 30.0–36.0)
MCV: 89.1 fL (ref 80.0–100.0)
Monocytes Absolute: 0.7 10*3/uL (ref 0.1–1.0)
Monocytes Relative: 7 %
Neutro Abs: 5.9 10*3/uL (ref 1.7–7.7)
Neutrophils Relative %: 65 %
Platelets: 302 10*3/uL (ref 150–400)
RBC: 4.31 MIL/uL (ref 4.22–5.81)
RDW: 15.3 % (ref 11.5–15.5)
WBC: 9.2 10*3/uL (ref 4.0–10.5)
nRBC: 0 % (ref 0.0–0.2)

## 2021-04-19 LAB — VITAMIN D 25 HYDROXY (VIT D DEFICIENCY, FRACTURES): Vit D, 25-Hydroxy: 19.72 ng/mL — ABNORMAL LOW (ref 30–100)

## 2021-04-19 LAB — IRON AND TIBC
Iron: 36 ug/dL — ABNORMAL LOW (ref 45–182)
Saturation Ratios: 10 % — ABNORMAL LOW (ref 17.9–39.5)
TIBC: 369 ug/dL (ref 250–450)
UIBC: 333 ug/dL

## 2021-04-19 LAB — FERRITIN: Ferritin: 54 ng/mL (ref 24–336)

## 2021-04-24 NOTE — Progress Notes (Signed)
? ?[facility] ?[address] ?[place], [place] [address] ? ? ?CLINIC:  ?Medical Oncology/Hematology ? ?PCP:  ?[name], MD ?[address] ?[place] [id number] ?[phone] ? ? ?REASON FOR VISIT:  ?Follow-up for iron deficiency anemia ? ?PRIOR THERAPY: None ? ?CURRENT THERAPY: Intermittent IV iron infusions (most recently given Venofer 300 mg x 4 with last dose on 03/12/2021) ? ?INTERVAL HISTORY:  ?Michael [name] 66 y.o. male returns for routine follow-up of his iron deficiency anemia.  He was last seen by Tarri Abernethy PA-C on 01/19/2021. ? ?At today's visit, he reports feeling fair.  He reports that he feels about the same after his IV iron infusion.  He is not sure if he is having any black bowel movements or red streaks in his bowel movements.  He reports that his energy is at baseline.  He denies any pica, restless legs, headaches, chest pain, dyspnea on exertion, lightheadedness, or syncope.  He experiences positional dizziness and vertigo when he lays flat. ?He reports 80% energy and 50% appetite.  He reports that he is maintaining stable weight. ? ? ?REVIEW OF SYSTEMS:  ?Review of Systems  ?Constitutional:  Positive for appetite change and fatigue. Negative for chills, diaphoresis, fever and unexpected weight change.  ?HENT:   Negative for lump/mass and nosebleeds.   ?Eyes:  Negative for eye problems.  ?Respiratory:  Negative for cough, hemoptysis and shortness of breath.   ?Cardiovascular:  Negative for chest pain, leg swelling and palpitations.  ?Gastrointestinal:  Negative for abdominal pain, blood in stool, constipation, diarrhea, nausea and vomiting.  ?Genitourinary:  Negative for hematuria.   ?Skin: Negative.   ?Neurological:  Negative for dizziness, headaches and light-headedness.  ?Hematological:  Does not bruise/bleed easily.  ?Psychiatric/Behavioral:  Positive for sleep disturbance.    ? ? ?PAST MEDICAL/SURGICAL HISTORY:  ?Past Medical History:  ?Diagnosis Date  ? Carotid  artery disease ([facility])   ? Chronic diastolic heart failure ([facility])   ? CKD (chronic kidney disease) stage 3, GFR 30-59 ml/min (HCC)   ? COPD (chronic obstructive pulmonary disease) ([facility])   ? Coronary artery disease   ? Essential hypertension   ? Head and neck cancer ([facility]) 2019  ? Right facial basal cell carcinoma s/p resection with right partial mastectomy and partal rhinectomy with skin graft (06/13/17)  ? Iron deficiency anemia   ? Urinary retention   ? ?Past Surgical History:  ?Procedure Laterality Date  ? ANKLE CLOSED REDUCTION Right   ? open reduction  ? BASAL CELL CARCINOMA EXCISION  2019  ? at [facility]  ? COLONOSCOPY N/A 05/31/2017  ? Procedure: COLONOSCOPY;  Surgeon: Daneil Dolin, MD;  [facility];  Service: Endoscopy;  Laterality: N/A;  2:45pm  ? CYSTOSCOPY N/A 10/29/2018  ? Procedure: CYSTOSCOPY, CLOT EVACUATION;  Surgeon: Ceasar Mons, MD;  Location: [facility];  Service: Urology;  Laterality: N/A;  ? POLYPECTOMY  05/31/2017  ? Procedure: POLYPECTOMY;  Surgeon: Daneil Dolin, MD;  [facility];  Service: Endoscopy;;  ? TONSILLECTOMY    ? TRANSCAROTID ARTERY REVASCULARIZATION?  Right 02/15/2021  ? Procedure: RIGHT TRANSCAROTID ARTERY REVASCULARIZATION;  Surgeon: [name] Heck, MD;  Location: [facility];  Service: Vascular;  Laterality: Right;  ? ULTRASOUND GUIDANCE FOR VASCULAR ACCESS Left 02/15/2021  ? Procedure: ULTRASOUND GUIDANCE FOR VASCULAR ACCESS;  Surgeon: [name] Heck, MD;  Location: [facility];  Service: Vascular;  Laterality: Left;  ? XI ROBOTIC ASSISTED SIMPLE PROSTATECTOMY N/A 10/29/2018  ?  Procedure: XI ROBOTIC ASSISTED SIMPLE PROSTATECTOMY;  Surgeon: Cleon Gustin, MD;  [facility];  Service: Urology;  Laterality: N/A;  ? ? ? ?SOCIAL HISTORY:  ?Social History  ? ?Socioeconomic History  ? Marital status: Married  ?  Spouse name: Not on file  ? Number of children: Not on file  ? Years of education: Not on file  ? Highest education level: Not on file   ?Occupational History  ? Not on file  ?Tobacco Use  ? Smoking status: Every Day  ?  Packs/day: 0.50  ?  Years: 40.00  ?  Pack years: 20.00  ?  Types: Cigarettes  ?  Last attempt to quit: 12/01/2019  ?  Years since quitting: 1.3  ?  Passive exposure: Never  ? Smokeless tobacco: Never  ?Vaping Use  ? Vaping Use: Never used  ?Substance and Sexual Activity  ? Alcohol use: Not Currently  ? Drug use: Never  ? Sexual activity: Not Currently  ?Other Topics Concern  ? Not on file  ?Social History Narrative  ? Not on file  ? ?Social Determinants of Health  ? ?Financial Resource Strain: Not on file  ?Food Insecurity: Not on file  ?Transportation Needs: Not on file  ?Physical Activity: Not on file  ?Stress: Not on file  ?Social Connections: Not on file  ?Intimate Partner Violence: Not on file  ? ? ?FAMILY HISTORY:  ?Family History  ?Problem Relation Age of Onset  ? Stroke Father   ? Cirrhosis Mother   ? Colon cancer Neg Hx   ? ? ?CURRENT MEDICATIONS:  ?Outpatient Encounter Medications as of 04/26/2021  ?Medication Sig Note  ? aspirin EC 81 MG tablet Take 81 mg by mouth daily.   ? atorvastatin (LIPITOR) 80 MG tablet Take 1 tablet (80 mg total) by mouth every evening.   ? clopidogrel (PLAVIX) 75 MG tablet Take 1 tablet (75 mg total) by mouth daily.   ? ergocalciferol (VITAMIN D2) 1.25 MG (50000 UT) capsule Take 1 capsule (50,000 Units total) by mouth once a week. 01/28/2021: Have not started   ? Evolocumab (REPATHA SURECLICK) 161 MG/ML SOAJ Inject 1 pen into the skin every 14 (fourteen) days.   ? ferrous sulfate 325 (65 FE) MG EC tablet Take 1 tablet (325 mg total) by mouth daily with breakfast.   ? isosorbide mononitrate (IMDUR) 30 MG 24 hr tablet Take 1 tablet (30 mg total) by mouth every evening.   ? lisinopril (ZESTRIL) 10 MG tablet Take 10 mg by mouth daily.   ? metoprolol succinate (TOPROL-XL) 25 MG 24 hr tablet Take 1 tablet (25 mg total) by mouth every evening.   ? nitroGLYCERIN (NITROSTAT) 0.4 MG SL tablet Place 0.4 mg  under the tongue every 5 (five) minutes as needed for chest pain.   ? ?No facility-administered encounter medications on file as of 04/26/2021.  ? ? ?ALLERGIES:  ?No Known Allergies ? ? ?PHYSICAL EXAM:  ?ECOG PERFORMANCE STATUS: 1 - Symptomatic but completely ambulatory ? ?There were no vitals filed for this visit. ?There were no vitals filed for this visit. ?Physical Exam ?Constitutional:   ?   Appearance: Normal appearance.  ?HENT:  ?   Head: Normocephalic and atraumatic.  ?   Comments: Nasal deformity (right side) following resection of skin cancer ?   Mouth/Throat:  ?   Mouth: Mucous membranes are moist.  ?Eyes:  ?   Extraocular Movements: Extraocular movements intact.  ?   Pupils: Pupils are equal, round, and reactive  to light.  ?Cardiovascular:  ?   Rate and Rhythm: Normal rate and regular rhythm.  ?   Pulses: Normal pulses.  ?   Heart sounds: Murmur (faint) heard.  ?Pulmonary:  ?   Effort: Pulmonary effort is normal.  ?   Breath sounds: Normal breath sounds. Decreased air movement present.  ?Abdominal:  ?   General: Bowel sounds are normal.  ?   Palpations: Abdomen is soft.  ?   Tenderness: There is no abdominal tenderness.  ?Musculoskeletal:     ?   General: No swelling.  ?   Right lower leg: No edema.  ?   Left lower leg: No edema.  ?Lymphadenopathy:  ?   Cervical: No cervical adenopathy.  ?Skin: ?   General: Skin is warm and dry.  ?Neurological:  ?   General: No focal deficit present.  ?   Mental Status: He is alert and oriented to person, place, and time.  ?Psychiatric:     ?   Mood and Affect: Mood normal.     ?   Behavior: Behavior normal.  ? ? ? ?LABORATORY DATA:  ?I have reviewed the labs as listed.  ?CBC ?   ?Component Value Date/Time  ? WBC 9.2 04/19/2021 1032  ? RBC 4.31 04/19/2021 1032  ? HGB 12.0 (L) 04/19/2021 1032  ? HGB 13.8 04/23/2011 0919  ? HCT 38.4 (L) 04/19/2021 1032  ? HCT 26.0 (L) 05/09/2018 0455  ? PLT 302 04/19/2021 1032  ? PLT 208 04/23/2011 0919  ? MCV 89.1 04/19/2021 1032  ? MCV 84  04/23/2011 0919  ? MCH 27.8 04/19/2021 1032  ? MCHC 31.3 04/19/2021 1032  ? RDW 15.3 04/19/2021 1032  ? RDW 12.3 04/23/2011 0919  ? LYMPHSABS 2.1 04/19/2021 1032  ? LYMPHSABS 1.1 04/23/2011 0919  ? MONOABS 0.7 03

## 2021-04-26 ENCOUNTER — Inpatient Hospital Stay (HOSPITAL_BASED_OUTPATIENT_CLINIC_OR_DEPARTMENT_OTHER): Payer: Medicare Other | Admitting: Physician Assistant

## 2021-04-26 VITALS — BP 194/80 | HR 54 | Temp 98.0°F | Resp 18 | Ht 72.0 in | Wt 158.7 lb

## 2021-04-26 DIAGNOSIS — D500 Iron deficiency anemia secondary to blood loss (chronic): Secondary | ICD-10-CM | POA: Diagnosis not present

## 2021-04-26 DIAGNOSIS — E559 Vitamin D deficiency, unspecified: Secondary | ICD-10-CM | POA: Diagnosis not present

## 2021-04-26 DIAGNOSIS — D509 Iron deficiency anemia, unspecified: Secondary | ICD-10-CM | POA: Diagnosis not present

## 2021-04-26 MED ORDER — ERGOCALCIFEROL 1.25 MG (50000 UT) PO CAPS: 50000.0000 [IU] | ORAL_CAPSULE | ORAL | 6 refills | Status: DC

## 2021-04-26 NOTE — Patient Instructions (Addendum)
[facility] at [facility] ?Discharge Instructions ? ?You were seen today by Tarri Abernethy PA-C for your iron deficiency anemia.  Your blood counts have improved, but your iron levels remain low.  I suspect that you are continuing to lose blood from somewhere in your GI tract, and it is important that you follow-up with the GI doctors as scheduled in MAY 2023.  In the meantime, we will schedule you for IV iron infusion x3 doses. ? ?Your blood pressure was high at your visit today.  Please make sure that you do not miss any doses of your blood pressure medications.  If your blood pressure remains high at home, please call your primary care doctor for adjustment of your blood pressure medications.  Seek immediate medical attention if you experience any chest pain, difficulty breathing, headache, dizziness, blurry vision, or strokelike symptoms. ? ?LABS: Return in 4 months for repeat labs ? ?OTHER TESTS: No other tests at this time ? ?MEDICATIONS: Prescription for vitamin D 50,000 units weekly has been sent to your pharmacy [facility]) ? ?FOLLOW-UP APPOINTMENT: Office visit in 4 months, after labs ? ? ?Thank you for choosing [place] at [facility] to provide your oncology and hematology care.  To afford each patient quality time with our provider, please arrive at least 15 minutes before your scheduled appointment time.  ? ?If you have a lab appointment with the [facility] please come in thru the Main Entrance and check in at the main information desk. ? ?You need to re-schedule your appointment should you arrive 10 or more minutes late.  We strive to give you quality time with our providers, and arriving late affects you and other patients whose appointments are after yours.  Also, if you no show three or more times for appointments you may be dismissed from the clinic at the providers discretion.     ?Again, thank you for choosing [facility].  Our hope is that these requests will decrease the amount of time that you wait before being seen by our physicians.       ?_____________________________________________________________ ? ?Should you have questions after your visit to [facility], please contact our office at [phone] and follow the prompts.  Our office hours are 8:00 a.m. and 4:30 p.m. Monday - Friday.  Please note that voicemails left after 4:00 p.m. may not be returned until the following business day.  We are closed weekends and major holidays.  You do have access to a nurse 24-7, just call the main number to the clinic [phone] and do not press any options, hold on the line and a nurse will answer the phone.   ? ?For prescription refill requests, have your pharmacy contact our office and allow 72 hours.   ? ?Due to Covid, you will need to wear a mask upon entering the hospital. If you do not have a mask, a mask will be given to you at the Main Entrance upon arrival. For doctor visits, patients may have 1 support person age 67 or older with them. For treatment visits, patients can not have anyone with them due to social distancing guidelines and our immunocompromised population.  ? ? ? ?

## 2021-04-30 ENCOUNTER — Inpatient Hospital Stay (HOSPITAL_COMMUNITY): Payer: Medicare Other

## 2021-04-30 VITALS — BP 148/55 | HR 78 | Temp 97.5°F | Resp 18

## 2021-04-30 DIAGNOSIS — D500 Iron deficiency anemia secondary to blood loss (chronic): Secondary | ICD-10-CM

## 2021-04-30 DIAGNOSIS — D509 Iron deficiency anemia, unspecified: Secondary | ICD-10-CM | POA: Diagnosis not present

## 2021-04-30 MED ORDER — LORATADINE 10 MG PO TABS
10.0000 mg | ORAL_TABLET | Freq: Once | ORAL | Status: AC
  Administered 2021-04-30: 10 mg via ORAL
  Filled 2021-04-30: qty 1

## 2021-04-30 MED ORDER — ACETAMINOPHEN 325 MG PO TABS
650.0000 mg | ORAL_TABLET | Freq: Once | ORAL | Status: AC
  Administered 2021-04-30: 650 mg via ORAL
  Filled 2021-04-30: qty 2

## 2021-04-30 MED ORDER — SODIUM CHLORIDE 0.9 % IV SOLN: Freq: Once | INTRAVENOUS | Status: AC

## 2021-04-30 MED ORDER — SODIUM CHLORIDE 0.9 % IV SOLN
300.0000 mg | Freq: Once | INTRAVENOUS | Status: AC
  Administered 2021-04-30: 300 mg via INTRAVENOUS
  Filled 2021-04-30: qty 300

## 2021-04-30 NOTE — Progress Notes (Signed)
Patient presents today for iron infusion.  Patient is in satisfactory condition with no complaints voiced.  Vital signs are stable.  We will proceed with treatment per MD orders.  ? ?Patient tolerated infusion well with no complaints voiced. IV removed by Cierra Bond, NT+3.   Patient refused to wait the recommended post 30 minute wait time.  Patient left ambulatory in stable condition.  Vital signs stable at discharge.  Follow up as scheduled.    ?

## 2021-04-30 NOTE — Patient Instructions (Signed)
[facility]  Discharge Instructions: Thank you for choosing [facility] to provide your oncology and hematology care.  If you have a lab appointment with the Cancer Center, please come in thru the Main Entrance and check in at the main information desk.  Wear comfortable clothing and clothing appropriate for easy access to any Portacath or PICC line.   We strive to give you quality time with your provider. You may need to reschedule your appointment if you arrive late (15 or more minutes).  Arriving late affects you and other patients whose appointments are after yours.  Also, if you miss three or more appointments without notifying the office, you may be dismissed from the clinic at the provider's discretion.      For prescription refill requests, have your pharmacy contact our office and allow 72 hours for refills to be completed.        To help prevent nausea and vomiting after your treatment, we encourage you to take your nausea medication as directed.  BELOW ARE SYMPTOMS THAT SHOULD BE REPORTED IMMEDIATELY: *FEVER GREATER THAN 100.4 F (38 C) OR HIGHER *CHILLS OR SWEATING *NAUSEA AND VOMITING THAT IS NOT CONTROLLED WITH YOUR NAUSEA MEDICATION *UNUSUAL SHORTNESS OF BREATH *UNUSUAL BRUISING OR BLEEDING *URINARY PROBLEMS (pain or burning when urinating, or frequent urination) *BOWEL PROBLEMS (unusual diarrhea, constipation, pain near the anus) TENDERNESS IN MOUTH AND THROAT WITH OR WITHOUT PRESENCE OF ULCERS (sore throat, sores in mouth, or a toothache) UNUSUAL RASH, SWELLING OR PAIN  UNUSUAL VAGINAL DISCHARGE OR ITCHING   Items with * indicate a potential emergency and should be followed up as soon as possible or go to the Emergency Department if any problems should occur.  Please show the CHEMOTHERAPY ALERT CARD or IMMUNOTHERAPY ALERT CARD at check-in to the Emergency Department and triage nurse.  Should you have questions after your visit or need to cancel  or reschedule your appointment, please contact [facility] [phone]  and follow the prompts.  Office hours are 8:00 a.m. to 4:30 p.m. Monday - Friday. Please note that voicemails left after 4:00 p.m. may not be returned until the following business day.  We are closed weekends and major holidays. You have access to a nurse at all times for urgent questions. Please call the main number to the clinic [phone] and follow the prompts.  For any non-urgent questions, you may also contact your provider using MyChart. We now offer e-Visits for anyone 18 and older to request care online for non-urgent symptoms. For details visit [email or web address].   Also download the MyChart app! Go to the app store, search "MyChart", open the app, select [facility], and log in with your MyChart username and password.  Due to Covid, a mask is required upon entering the hospital/clinic. If you do not have a mask, one will be given to you upon arrival. For doctor visits, patients may have 1 support person aged 18 or older with them. For treatment visits, patients cannot have anyone with them due to current Covid guidelines and our immunocompromised population.  

## 2021-05-12 ENCOUNTER — Encounter (HOSPITAL_COMMUNITY): Payer: Self-pay

## 2021-05-12 ENCOUNTER — Inpatient Hospital Stay (HOSPITAL_COMMUNITY): Payer: Medicare Other | Attending: Hematology

## 2021-05-12 VITALS — BP 143/92 | HR 73 | Temp 97.8°F | Resp 18

## 2021-05-12 DIAGNOSIS — D509 Iron deficiency anemia, unspecified: Secondary | ICD-10-CM | POA: Diagnosis present

## 2021-05-12 DIAGNOSIS — D500 Iron deficiency anemia secondary to blood loss (chronic): Secondary | ICD-10-CM

## 2021-05-12 MED ORDER — CLONIDINE HCL 0.1 MG PO TABS
0.2000 mg | ORAL_TABLET | Freq: Once | ORAL | Status: AC
  Administered 2021-05-12: 0.2 mg via ORAL
  Filled 2021-05-12: qty 2

## 2021-05-12 MED ORDER — ACETAMINOPHEN 325 MG PO TABS
650.0000 mg | ORAL_TABLET | Freq: Once | ORAL | Status: AC
  Administered 2021-05-12: 650 mg via ORAL
  Filled 2021-05-12: qty 2

## 2021-05-12 MED ORDER — SODIUM CHLORIDE 0.9 % IV SOLN: Freq: Once | INTRAVENOUS | Status: AC

## 2021-05-12 MED ORDER — LORATADINE 10 MG PO TABS
10.0000 mg | ORAL_TABLET | Freq: Once | ORAL | Status: AC
  Administered 2021-05-12: 10 mg via ORAL
  Filled 2021-05-12: qty 1

## 2021-05-12 MED ORDER — SODIUM CHLORIDE 0.9 % IV SOLN
300.0000 mg | Freq: Once | INTRAVENOUS | Status: AC
  Administered 2021-05-12: 300 mg via INTRAVENOUS
  Filled 2021-05-12: qty 300

## 2021-05-12 NOTE — Patient Instructions (Signed)
[facility]  Discharge Instructions: ?Thank you for choosing [facility] to provide your oncology and hematology care.  ?If you have a lab appointment with the [facility], please come in thru the Main Entrance and check in at the main information desk. ? ?Wear comfortable clothing and clothing appropriate for easy access to any Portacath or PICC line.  ? ?We strive to give you quality time with your provider. You may need to reschedule your appointment if you arrive late (15 or more minutes).  Arriving late affects you and other patients whose appointments are after yours.  Also, if you miss three or more appointments without notifying the office, you may be dismissed from the clinic at the provider?s discretion.    ?  ?For prescription refill requests, have your pharmacy contact our office and allow 72 hours for refills to be completed.   ? ?Today you received the following Venofer, d/t high blood pressure you also received 0.2 mg of clonidine. Call your pharmacy to refill cardiac medications. Return as scheduled. ?  ?To help prevent nausea and vomiting after your treatment, we encourage you to take your nausea medication as directed. ? ?BELOW ARE SYMPTOMS THAT SHOULD BE REPORTED IMMEDIATELY: ?*FEVER GREATER THAN 100.4 F (38 ?C) OR HIGHER ?*CHILLS OR SWEATING ?*NAUSEA AND VOMITING THAT IS NOT CONTROLLED WITH YOUR NAUSEA MEDICATION ?*UNUSUAL SHORTNESS OF BREATH ?*UNUSUAL BRUISING OR BLEEDING ?*URINARY PROBLEMS (pain or burning when urinating, or frequent urination) ?*BOWEL PROBLEMS (unusual diarrhea, constipation, pain near the anus) ?TENDERNESS IN MOUTH AND THROAT WITH OR WITHOUT PRESENCE OF ULCERS (sore throat, sores in mouth, or a toothache) ?UNUSUAL RASH, SWELLING OR PAIN  ?UNUSUAL VAGINAL DISCHARGE OR ITCHING  ? ?Items with * indicate a potential emergency and should be followed up as soon as possible or go to the Emergency Department if any problems should occur. ? ?Please show  the CHEMOTHERAPY ALERT CARD or IMMUNOTHERAPY ALERT CARD at check-in to the Emergency Department and triage nurse. ? ?Should you have questions after your visit or need to cancel or reschedule your appointment, please contact [facility] [phone]  and follow the prompts.  Office hours are 8:00 a.m. to 4:30 p.m. Monday - Friday. Please note that voicemails left after 4:00 p.m. may not be returned until the following business day.  We are closed weekends and major holidays. You have access to a nurse at all times for urgent questions. Please call the main number to the clinic [phone] and follow the prompts. ? ?For any non-urgent questions, you may also contact your provider using MyChart. We now offer e-Visits for anyone 40 and older to request care online for non-urgent symptoms. For details visit [email or web address]. ?  ?Also download the MyChart app! Go to the app store, search "MyChart", open the app, select [facility], and log in with your MyChart username and password. ? ?Due to Covid, a mask is required upon entering the hospital/clinic. If you do not have a mask, one will be given to you upon arrival. For doctor visits, patients may have 1 support person aged 58 or older with them. For treatment visits, patients cannot have anyone with them due to current Covid guidelines and our immunocompromised population.  ?

## 2021-05-12 NOTE — Progress Notes (Signed)
Patient tolerated iron infusion with no complaints voiced. BP after iron infusion 212/72, Tarri Abernethy, PA made aware, patient states he ran out of his BP medication at home but is unsure of which one it is. Received orders for 0.2 mg of clonidine, BP after 30 mins 143/92. Patient verbalizes understanding of the need to call and get refills of prescription. Peripheral IV site clean and dry with good blood return noted before and after infusion. Band aid applied. VSS with discharge and left in satisfactory condition with no s/s of distress noted.   ?

## 2021-05-18 ENCOUNTER — Telehealth: Payer: Self-pay

## 2021-05-18 DIAGNOSIS — I10 Essential (primary) hypertension: Secondary | ICD-10-CM

## 2021-05-18 DIAGNOSIS — I25119 Atherosclerotic heart disease of native coronary artery with unspecified angina pectoris: Secondary | ICD-10-CM

## 2021-05-18 NOTE — Telephone Encounter (Signed)
Called and scheduled fasting labs  ?

## 2021-05-18 NOTE — Telephone Encounter (Signed)
-----   Message from Ramond Dial, [place] sent at 05/18/2021  1:10 PM EDT ----- ?Please schedule lipids, apo b. thanks ?----- Message ----- ?From: Ramond Dial, RPH-CPP ?Sent: 05/18/2021  12:00 AM EDT ?To: Ramond Dial, RPH-CPP ? ?Set up lipids ? ? ?

## 2021-05-19 ENCOUNTER — Inpatient Hospital Stay (HOSPITAL_COMMUNITY): Payer: Medicare Other

## 2021-05-19 ENCOUNTER — Telehealth: Payer: Self-pay

## 2021-05-19 VITALS — BP 190/90 | HR 69 | Temp 96.7°F | Resp 18

## 2021-05-19 DIAGNOSIS — D509 Iron deficiency anemia, unspecified: Secondary | ICD-10-CM | POA: Diagnosis not present

## 2021-05-19 DIAGNOSIS — D500 Iron deficiency anemia secondary to blood loss (chronic): Secondary | ICD-10-CM

## 2021-05-19 MED ORDER — SODIUM CHLORIDE 0.9 % IV SOLN
300.0000 mg | Freq: Once | INTRAVENOUS | Status: AC
  Administered 2021-05-19: 300 mg via INTRAVENOUS
  Filled 2021-05-19: qty 300

## 2021-05-19 MED ORDER — CLOPIDOGREL BISULFATE 75 MG PO TABS: 75.0000 mg | ORAL_TABLET | Freq: Every day | ORAL | 6 refills | Status: DC

## 2021-05-19 MED ORDER — CLONIDINE HCL 0.1 MG PO TABS
0.2000 mg | ORAL_TABLET | Freq: Once | ORAL | Status: AC
  Administered 2021-05-19: 0.2 mg via ORAL
  Filled 2021-05-19: qty 2

## 2021-05-19 MED ORDER — ACETAMINOPHEN 325 MG PO TABS
650.0000 mg | ORAL_TABLET | Freq: Once | ORAL | Status: AC
  Administered 2021-05-19: 650 mg via ORAL
  Filled 2021-05-19: qty 2

## 2021-05-19 MED ORDER — LORATADINE 10 MG PO TABS
10.0000 mg | ORAL_TABLET | Freq: Once | ORAL | Status: AC
  Administered 2021-05-19: 10 mg via ORAL
  Filled 2021-05-19: qty 1

## 2021-05-19 MED ORDER — SODIUM CHLORIDE 0.9 % IV SOLN: Freq: Once | INTRAVENOUS | Status: AC

## 2021-05-19 NOTE — Telephone Encounter (Signed)
Pt walked in for a refill on Plavix 75 mg tablets. Approved and sent to [facility] per pt request  ?

## 2021-05-19 NOTE — Progress Notes (Signed)
Patient presents today for Venofer infusion per providers order.  BP 166/99,  PA notified.  Message from PA to proceed with Venofer and to give 0.2 mg of clonidine if patients BP increases to 180/100 or greater. ? ?Peripheral IV started and blood return noted pre and post infusion.   ? ?Post infusion BP 182/112 0.2 clonidine given per providers order.  BP rechecked after 20 minutes and still elevated at 190/99.  Provider notified.  Patient instructed by PA to follow up with PCP if asymptomatic.  Patient is to recheck his BP when he gets home and if it is still elevated 160/90 to call his PCP today for further instructions.  If he becomes symptomatic then the patient was instructed to go to the emergency room.  Patient expressed understanding and agreed to the instruction.   ? ?Venofer given today per MD orders.  Tolerated infusion without adverse affects.  No complaints at this time.  Discharge from clinic ambulatory in stable condition.  Alert and oriented X 3.  Follow up with [facility] as scheduled.  ?

## 2021-05-19 NOTE — Patient Instructions (Signed)
[facility]  Discharge Instructions: Thank you for choosing [facility] to provide your oncology and hematology care.  If you have a lab appointment with the Cancer Center, please come in thru the Main Entrance and check in at the main information desk.  Wear comfortable clothing and clothing appropriate for easy access to any Portacath or PICC line.   We strive to give you quality time with your provider. You may need to reschedule your appointment if you arrive late (15 or more minutes).  Arriving late affects you and other patients whose appointments are after yours.  Also, if you miss three or more appointments without notifying the office, you may be dismissed from the clinic at the provider's discretion.      For prescription refill requests, have your pharmacy contact our office and allow 72 hours for refills to be completed.    Today you received the following chemotherapy and/or immunotherapy agents Venofer      To help prevent nausea and vomiting after your treatment, we encourage you to take your nausea medication as directed.  BELOW ARE SYMPTOMS THAT SHOULD BE REPORTED IMMEDIATELY: *FEVER GREATER THAN 100.4 F (38 C) OR HIGHER *CHILLS OR SWEATING *NAUSEA AND VOMITING THAT IS NOT CONTROLLED WITH YOUR NAUSEA MEDICATION *UNUSUAL SHORTNESS OF BREATH *UNUSUAL BRUISING OR BLEEDING *URINARY PROBLEMS (pain or burning when urinating, or frequent urination) *BOWEL PROBLEMS (unusual diarrhea, constipation, pain near the anus) TENDERNESS IN MOUTH AND THROAT WITH OR WITHOUT PRESENCE OF ULCERS (sore throat, sores in mouth, or a toothache) UNUSUAL RASH, SWELLING OR PAIN  UNUSUAL VAGINAL DISCHARGE OR ITCHING   Items with * indicate a potential emergency and should be followed up as soon as possible or go to the Emergency Department if any problems should occur.  Please show the CHEMOTHERAPY ALERT CARD or IMMUNOTHERAPY ALERT CARD at check-in to the Emergency  Department and triage nurse.  Should you have questions after your visit or need to cancel or reschedule your appointment, please contact [facility] [phone]  and follow the prompts.  Office hours are 8:00 a.m. to 4:30 p.m. Monday - Friday. Please note that voicemails left after 4:00 p.m. may not be returned until the following business day.  We are closed weekends and major holidays. You have access to a nurse at all times for urgent questions. Please call the main number to the clinic [phone] and follow the prompts.  For any non-urgent questions, you may also contact your provider using MyChart. We now offer e-Visits for anyone 18 and older to request care online for non-urgent symptoms. For details visit [email or web address].   Also download the MyChart app! Go to the app store, search "MyChart", open the app, select [facility], and log in with your MyChart username and password.  Due to Covid, a mask is required upon entering the hospital/clinic. If you do not have a mask, one will be given to you upon arrival. For doctor visits, patients may have 1 support person aged 18 or older with them. For treatment visits, patients cannot have anyone with them due to current Covid guidelines and our immunocompromised population.  

## 2021-05-20 ENCOUNTER — Other Ambulatory Visit: Payer: Self-pay

## 2021-05-20 DIAGNOSIS — I25119 Atherosclerotic heart disease of native coronary artery with unspecified angina pectoris: Secondary | ICD-10-CM

## 2021-05-21 ENCOUNTER — Other Ambulatory Visit: Payer: Medicare Other

## 2021-05-21 ENCOUNTER — Other Ambulatory Visit (HOSPITAL_COMMUNITY)
Admission: RE | Admit: 2021-05-21 | Discharge: 2021-05-21 | Disposition: A | Discharge status: Home / Self Care | Payer: Medicare Other | Source: Ambulatory Visit | Attending: Pharmacist | Admitting: Pharmacist

## 2021-05-21 DIAGNOSIS — I25119 Atherosclerotic heart disease of native coronary artery with unspecified angina pectoris: Secondary | ICD-10-CM | POA: Diagnosis present

## 2021-05-21 LAB — LIPID PANEL
Cholesterol: 178 mg/dL (ref 0–200)
HDL: 55 mg/dL (ref >40)
LDL Cholesterol: 106 mg/dL — ABNORMAL HIGH (ref 0–99)
Total CHOL/HDL Ratio: 3.2 RATIO
Triglycerides: 87 mg/dL (ref <150)
VLDL: 17 mg/dL (ref 0–40)

## 2021-05-22 LAB — MISC LABCORP TEST (SEND OUT): Labcorp test code: 167015

## 2021-05-24 ENCOUNTER — Other Ambulatory Visit (HOSPITAL_COMMUNITY): Payer: Self-pay | Admitting: Physician Assistant

## 2021-05-24 ENCOUNTER — Telehealth: Payer: Self-pay | Admitting: Pharmacist

## 2021-05-24 DIAGNOSIS — E785 Hyperlipidemia, unspecified: Secondary | ICD-10-CM

## 2021-05-24 NOTE — Progress Notes (Signed)
Lipid panel and apolipoprotein B added to the patient's upcoming labs at the request of another provider Prentiss Bells, Melissa D, RPH-CPP), to avoid patient needing additional lab visits. ?

## 2021-05-24 NOTE — Telephone Encounter (Signed)
Called patient and reviewed his lipid labs. Patient LDL-C increased after starting Repatha. He states he has been taking both the atorvastatin '80mg'$  daily and Repatha q 14 days.  ?I do not see fill history for atorvastatin, but I do see one for Repatha. ?I have asked patient to make sure he is taking both atorvastatin '80mg'$  and Repatha. ?Will recheck labs in 2-3 months. ?He has labs for the cancer center in July. ?I will see if Dr. Leron Croak would be willing to order a lipid panel and ApoB so patient only has to go to lab once. ? ?

## 2021-06-06 NOTE — Progress Notes (Deleted)
GI Office Note    Referring Provider: [name], MD Primary Care Physician:  [name], MD  Primary Gastroenterologist: Garfield Cornea, MD Referring provider: Tarri Abernethy, Pa-C  Chief Complaint   No chief complaint on file.    History of Present Illness   Michael [name] is a 66 y.o. male presenting today for further evaluation of IDA, heme + stool at request of Tarri Abernethy, PA-C (hematology/oncology). He has had increasing needs to IV iron. Last seen in 05/2017 for IDA and abnormal CT/PET with questionable adenopathy around the porta hepatis, some iliac egion lymphadenopathy around the porta hepatis, some iliac region lymphadenopathy, uptake near the ascending colon.    Colonoscopy 05/2017: - Six 5 to 8 mm polyps in the descending colon, removed with a cold snare. Resected and retrieved. - One 8 mm polyp in the descending colon, removed with a hot snare. Resected and retrieved. MULTIPLE LARGE AND SMALL POLYPS LOCATED FROM THE AREA OF HEPATIC FLEXURE TO THE CECUM ALONG WITH A POLYPOID MASS (BIOPSIED) NOT FELT TO BE REMOVABLE COMPLETELY ENDOSCOPICALLY. COLON FROM TATOO LINE PROXIMALLY NEEDS RESECTED TO INSURE REMOVAL OF PERSISTING LESIONS. -ascending colon mass showed advanced adenoma, carcinoma could be there but not sampled, multiple polyps between the hepatic flexure and cecum, right hemicolectomy recommended.  ***Patient did not follow through with seeing oncology/surgery.   Medications   Current Outpatient Medications  Medication Sig Dispense Refill   aspirin EC 81 MG tablet Take 81 mg by mouth daily.     atorvastatin (LIPITOR) 80 MG tablet Take 1 tablet (80 mg total) by mouth every evening. 90 tablet 3   clopidogrel (PLAVIX) 75 MG tablet Take 1 tablet (75 mg total) by mouth daily. 30 tablet 6   ergocalciferol (VITAMIN D2) 1.25 MG (50000 UT) capsule Take 1 capsule (50,000 Units total) by mouth once a week. 5 capsule 6   Evolocumab (REPATHA SURECLICK)  237 MG/ML SOAJ Inject 1 pen into the skin every 14 (fourteen) days. 2 mL 11   ferrous sulfate 325 (65 FE) MG EC tablet Take 1 tablet (325 mg total) by mouth daily with breakfast. 30 tablet 3   isosorbide mononitrate (IMDUR) 30 MG 24 hr tablet Take 1 tablet (30 mg total) by mouth every evening. 90 tablet 3   lisinopril (ZESTRIL) 10 MG tablet Take 10 mg by mouth daily.     metoprolol succinate (TOPROL-XL) 25 MG 24 hr tablet Take 1 tablet (25 mg total) by mouth every evening. 90 tablet 3   nitroGLYCERIN (NITROSTAT) 0.4 MG SL tablet Place 0.4 mg under the tongue every 5 (five) minutes as needed for chest pain.     No current facility-administered medications for this visit.    Allergies   Allergies as of 06/07/2021   (No Known Allergies)    Past Medical History   Past Medical History:  Diagnosis Date   Carotid artery disease (HCC)    Chronic diastolic heart failure (HCC)    CKD (chronic kidney disease) stage 3, GFR 30-59 ml/min (HCC)    COPD (chronic obstructive pulmonary disease) (HCC)    Coronary artery disease    Essential hypertension    Head and neck cancer ([place]) 2019   Right facial basal cell carcinoma s/p resection with right partial mastectomy and partal rhinectomy with skin graft (06/13/17)   Iron deficiency anemia    Urinary retention     Past Surgical History   Past Surgical History:  Procedure Laterality Date   ANKLE CLOSED  REDUCTION Right    open reduction   BASAL CELL CARCINOMA EXCISION  2019   at [facility]   COLONOSCOPY N/A 05/31/2017   Procedure: COLONOSCOPY;  Surgeon: Daneil Dolin, MD;  Location: [facility];  Service: Endoscopy;  Laterality: N/A;  2:45pm   CYSTOSCOPY N/A 10/29/2018   Procedure: CYSTOSCOPY, CLOT EVACUATION;  Surgeon: Ceasar Mons, MD;  Location: [facility];  Service: Urology;  Laterality: N/A;   POLYPECTOMY  05/31/2017   Procedure: POLYPECTOMY;  Surgeon: Daneil Dolin, MD;  Location: [facility];  Service: Endoscopy;;   TONSILLECTOMY      TRANSCAROTID ARTERY REVASCULARIZATION  Right 02/15/2021   Procedure: RIGHT TRANSCAROTID ARTERY REVASCULARIZATION;  Surgeon: [name] Heck, MD;  Location: [place];  Service: Vascular;  Laterality: Right;   ULTRASOUND GUIDANCE FOR VASCULAR ACCESS Left 02/15/2021   Procedure: ULTRASOUND GUIDANCE FOR VASCULAR ACCESS;  Surgeon: [name] Heck, MD;  Location: [place];  Service: Vascular;  Laterality: Left;   XI ROBOTIC ASSISTED SIMPLE PROSTATECTOMY N/A 10/29/2018   Procedure: XI ROBOTIC ASSISTED SIMPLE PROSTATECTOMY;  Surgeon: Cleon Gustin, MD;  Location: [facility];  Service: Urology;  Laterality: N/A;    Past Family History   Family History  Problem Relation Age of Onset   Stroke Father    Cirrhosis Mother    Colon cancer Neg Hx     Past Social History   Social History   Socioeconomic History   Marital status: Married    Spouse name: Not on file   Number of children: Not on file   Years of education: Not on file   Highest education level: Not on file  Occupational History   Not on file  Tobacco Use   Smoking status: Every Day    Packs/day: 0.50    Years: 40.00    Pack years: 20.00    Types: Cigarettes    Last attempt to quit: 12/01/2019    Years since quitting: 1.5    Passive exposure: Never   Smokeless tobacco: Never  Vaping Use   Vaping Use: Never used  Substance and Sexual Activity   Alcohol use: Not Currently   Drug use: Never   Sexual activity: Not Currently  Other Topics Concern   Not on file  Social History Narrative   Not on file   Social Determinants of Health   Financial Resource Strain: Not on file  Food Insecurity: Not on file  Transportation Needs: Not on file  Physical Activity: Not on file  Stress: Not on file  Social Connections: Not on file  Intimate Partner Violence: Not on file    Review of Systems   General: Negative for anorexia, weight loss, fever, chills, fatigue, weakness. Eyes: Negative for vision changes.  ENT:  Negative for hoarseness, difficulty swallowing , nasal congestion. CV: Negative for chest pain, angina, palpitations, dyspnea on exertion, peripheral edema.  Respiratory: Negative for dyspnea at rest, dyspnea on exertion, cough, sputum, wheezing.  GI: See history of present illness. GU:  Negative for dysuria, hematuria, urinary incontinence, urinary frequency, nocturnal urination.  MS: Negative for joint pain, low back pain.  Derm: Negative for rash or itching.  Neuro: Negative for weakness, abnormal sensation, seizure, frequent headaches, memory loss,  confusion.  Psych: Negative for anxiety, depression, suicidal ideation, hallucinations.  Endo: Negative for unusual weight change.  Heme: Negative for bruising or bleeding. Allergy: Negative for rash or hives.  Physical Exam   There were no vitals taken for this visit.   General:  Well-nourished, well-developed in no acute distress.  Head: Normocephalic, atraumatic.   Eyes: Conjunctiva pink, no icterus. Mouth: Oropharyngeal mucosa moist and pink , no lesions erythema or exudate. Neck: Supple without thyromegaly, masses, or lymphadenopathy.  Lungs: Clear to auscultation bilaterally.  Heart: Regular rate and rhythm, no murmurs rubs or gallops.  Abdomen: Bowel sounds are normal, nontender, nondistended, no hepatosplenomegaly or masses,  no abdominal bruits or hernia, no rebound or guarding.   Rectal: not performed Extremities: No lower extremity edema. No clubbing or deformities.  Neuro: Alert and oriented x 4 , grossly normal neurologically.  Skin: Warm and dry, no rash or jaundice.   Psych: Alert and cooperative, normal mood and affect.  Labs   Lab Results  Component Value Date   CREATININE 1.92 (H) 02/18/2021   BUN 40 (H) 02/18/2021   NA 142 02/18/2021   K 4.4 02/18/2021   CL 116 (H) 02/18/2021   CO2 20 (L) 02/18/2021   Lab Results  Component Value Date   WBC 9.2 04/19/2021   HGB 12.0 (L) 04/19/2021   HCT 38.4 (L)  04/19/2021   MCV 89.1 04/19/2021   PLT 302 04/19/2021   Lab Results  Component Value Date   IRON 36 (L) 04/19/2021   TIBC 369 04/19/2021   FERRITIN 54 04/19/2021   Lab Results  Component Value Date   VITAMINB12 289 10/12/2020   Lab Results  Component Value Date   FOLATE 7.3 12/04/2019    Imaging Studies   No results found.  Assessment       PLAN   ***   Laureen Ochs. Bobby Rumpf, [place], [facility]

## 2021-06-07 ENCOUNTER — Ambulatory Visit: Payer: Medicaid Other | Admitting: Gastroenterology

## 2021-06-07 ENCOUNTER — Telehealth: Payer: Self-pay | Admitting: Gastroenterology

## 2021-06-07 NOTE — Telephone Encounter (Signed)
If patient is willing, we should try to get him seen in the next 1-2 weeks. His initial referral is from 10/2020 and we have not seen him yet and he has several pending GI concerns (does not appear he had colon surgery for advanced colon polyps/mass) and IDA requiring iron infusions.  ? ? ?

## 2021-06-08 NOTE — Telephone Encounter (Signed)
Yes, you can use an urgent spot or an virtual spot earlier in the day.  ?

## 2021-06-14 ENCOUNTER — Telehealth: Payer: Self-pay

## 2021-06-14 NOTE — Telephone Encounter (Signed)
Returned call and patient states he had missed a call on Friday.  I informed him it was the appointment reminder that he had an upcoming apt on 06/16/21.  Patient aware.  ?

## 2021-06-14 NOTE — Telephone Encounter (Signed)
Patient left a voice messaged 06-11-21. ? ?He called 3 times wanting a call back at [phone].  Did not give a reason for the call. ? ?Thanks, ?Helene Kelp ?

## 2021-06-16 ENCOUNTER — Encounter: Payer: Self-pay | Admitting: Urology

## 2021-06-16 ENCOUNTER — Ambulatory Visit (INDEPENDENT_AMBULATORY_CARE_PROVIDER_SITE_OTHER): Payer: Medicare Other | Admitting: Gastroenterology

## 2021-06-16 ENCOUNTER — Ambulatory Visit (INDEPENDENT_AMBULATORY_CARE_PROVIDER_SITE_OTHER): Payer: Medicare Other | Admitting: Urology

## 2021-06-16 ENCOUNTER — Encounter: Payer: Self-pay | Admitting: Gastroenterology

## 2021-06-16 VITALS — BP 179/76 | HR 76

## 2021-06-16 VITALS — BP 140/78 | HR 40 | Temp 97.3°F | Ht 72.0 in | Wt 162.6 lb

## 2021-06-16 DIAGNOSIS — N3000 Acute cystitis without hematuria: Secondary | ICD-10-CM | POA: Diagnosis not present

## 2021-06-16 DIAGNOSIS — Z86010 Personal history of colonic polyps: Secondary | ICD-10-CM | POA: Diagnosis not present

## 2021-06-16 DIAGNOSIS — R339 Retention of urine, unspecified: Secondary | ICD-10-CM | POA: Diagnosis not present

## 2021-06-16 DIAGNOSIS — D500 Iron deficiency anemia secondary to blood loss (chronic): Secondary | ICD-10-CM

## 2021-06-16 DIAGNOSIS — Z860101 Personal history of adenomatous and serrated colon polyps: Secondary | ICD-10-CM | POA: Insufficient documentation

## 2021-06-16 DIAGNOSIS — N401 Enlarged prostate with lower urinary tract symptoms: Secondary | ICD-10-CM | POA: Diagnosis not present

## 2021-06-16 DIAGNOSIS — K625 Hemorrhage of anus and rectum: Secondary | ICD-10-CM | POA: Insufficient documentation

## 2021-06-16 DIAGNOSIS — N138 Other obstructive and reflux uropathy: Secondary | ICD-10-CM

## 2021-06-16 LAB — MICROSCOPIC EXAMINATION
Renal Epithel, UA: NONE SEEN /hpf
WBC, UA: >30 /hpf — AB (ref 0–5)

## 2021-06-16 LAB — URINALYSIS, ROUTINE W REFLEX MICROSCOPIC
Bilirubin, UA: NEGATIVE
Glucose, UA: NEGATIVE
Ketones, UA: NEGATIVE
Nitrite, UA: POSITIVE — AB
Specific Gravity, UA: 1.02 (ref 1.005–1.030)
Urobilinogen, Ur: 0.2 mg/dL (ref 0.2–1.0)
pH, UA: 6 (ref 5.0–7.5)

## 2021-06-16 LAB — BLADDER SCAN AMB NON-IMAGING: Scan Result: 15

## 2021-06-16 MED ORDER — NITROFURANTOIN MONOHYD MACRO 100 MG PO CAPS: 100.0000 mg | ORAL_CAPSULE | Freq: Two times a day (BID) | ORAL | 0 refills | Status: DC

## 2021-06-16 NOTE — Patient Instructions (Signed)
Asymptomatic Bacteriuria Asymptomatic bacteriuria is the presence of a large number of bacteria in the urine without the usual symptoms of burning or frequent urination. This is usually not harmful, and treatment may not be needed. A person with this condition will not be more likely to develop an infection in the future. What are the causes? This condition is caused by an increase in bacteria in the urine. This increase can be caused by: Bacteria entering the urinary tract, such as during sex. A blockage in the urinary tract, such as from kidney stones or a tumor. Bladder problems that prevent the bladder from emptying. What increases the risk? You are more likely to develop this condition if: You have diabetes. You are an older adult. This especially affects older adults in long-term care facilities. You are pregnant and in the first trimester. You have kidney stones. You are male. You have had a kidney transplant. You have a leaky kidney tube valve (reflux). You had a urinary catheter for a long period of time. This is a long, thin tube that collects urine. What are the signs or symptoms? There are no symptoms of this condition. How is this diagnosed? This condition is diagnosed with a urine test. Because this condition does not cause symptoms, it is usually diagnosed when a urine sample is taken to treat or diagnose another condition, such as pregnancy or kidney problems. Most women who are in their first trimester of pregnancy are screened for asymptomatic bacteriuria. How is this treated? Usually, treatment is not needed for this condition. Treating the condition can lead to other problems, such as a yeast infection or the growth of bacteria that do not respond to treatment (antibiotic-resistant bacteria). Some people do need treatment with antibiotic medicines to prevent kidney infection, known as pyelonephritis. Treatment is needed if: You are pregnant. In pregnant women, kidney  infection can lead to: Early labor (premature labor). Very low birth weight (fetal growth restriction). Newborn death. You are having a procedure that affects the urinary tract. You have had a kidney transplant. If you are diagnosed with this condition, talk with your health care provider about any concerns that you have. Follow these instructions at home: Medicines Take over-the-counter and prescription medicines only as told by your health care provider. If you were prescribed an antibiotic medicine, take it as told by your health care provider. Do not stop using the antibiotic even if you start to feel better. General instructions Monitor your condition for any changes. Drink enough fluid to keep your urine pale yellow. Urinate more often to keep your bladder empty. If you are male, keep the area around your vagina and rectum clean. Wipe from front to back after urinating or having a bowel movement. Use each piece of toilet paper only once. Keep all follow-up visits. This is important. Contact a health care provider if: You have symptoms of a urine infection, such as: A burning sensation, or pain when you urinate. A strong need to urinate, or urinating more often. Urine turning discolored or cloudy. Blood in your urine. Urine that smells bad. Get help right away if: You develop signs of a kidney infection, such as: Back pain or pelvic pain. A fever or chills. Nausea or vomiting. Severe pain that cannot be controlled with medicine. Summary Asymptomatic bacteriuria is the presence of a large number of bacteria in the urine without the usual symptoms of burning or frequent urination. Usually, treatment is not needed for this condition. Treating the condition can lead   to other problems, such as a yeast infection or the growth of bacteria that do not respond to treatment. Some people do need treatment. Treatment is needed if you are pregnant, if you are having a procedure that  affects the urinary tract, or if you have had a kidney transplant. If you were prescribed an antibiotic medicine, take it as told by your health care provider. Do not stop using the antibiotic even if you start to feel better. This information is not intended to replace advice given to you by your health care provider. Make sure you discuss any questions you have with your health care provider. Document Revised: 08/30/2019 Document Reviewed: 08/30/2019 Elsevier Patient Education  2023 [employer]  

## 2021-06-16 NOTE — Progress Notes (Signed)
post void residual=15 

## 2021-06-16 NOTE — Progress Notes (Signed)
GI Office Note    Referring Provider: [name], MD Primary Care Physician:  Kathyrn Lass  Primary Gastroenterologist: Garfield Cornea, MD   Chief Complaint   Chief Complaint  Patient presents with   Rectal Bleeding    Dark bloody stools and bright red blood on tissue.     History of Present Illness   Michael [name] is a 66 y.o. male presenting today at the request of Tarri Abernethy, PA-C for further evaluation of IDA, GI bleeding.  Initial referral came in September 2022, patient initially refused the service.  Recent missed appointment and we have requested him to consider rescheduling.  Patient seen by hematology/oncology back in March.  Followed for IDA, locally advanced basal cell carcinoma of the right nasolabial fold.  Has been receiving intermittent IV iron infusions.  Last infusion in April 2023.  In June 2022, stool was heme positive x3.  Intermittent dark stool and occasional red streaks.  Labs from March 2023: Hemoglobin 12, hematocrit 30.4.  Iron 36, TIBC 369, iron saturation is 10%, ferritin 54.  We last saw the patient in 2019.  He has a history of ascending colon mass, pathology showed advanced adenoma but carcinoma could have been present but just not sampled.  Also with multiple polyps between the hepatic flexure and the cecum.  Dr. Gala Romney recommended right hemicolectomy and he was referred to see Dr. Blake Divine but unfortunately patient was lost to follow up.   Today: patient states he has intermittent dark blood in the stools. Last time was few days ago. He felt like it was a lot but hard to tell. Generally has two BMs daily. No straining. Can't tell if stool is black "due to the blood". Denies lightheadedness. He states he has been on IV iron for over a year. No blood transfusion per patient. Denies heartburn, vomiting, abdominal pain, dysphagia.     H/O PVD, underwent right TCAR for carotid artery stenosis 01/2021 by Dr. Carlis Abbott. Placed on Plavix at that time.      Colonoscopy May 2019 completed due to abnormal PET CT with uptake near the ascending colon. - Six 5 to 8 mm polyps in the descending colon, removed with a cold snare. Resected and retrieved. - One 8 mm polyp in the descending colon, removed with a hot snare. Resected and retrieved. MULTIPLE LARGE AND SMALL POLYPS LOCATED FROM THE AREA OF HEPATIC FLEXURE TO THE CECUM ALONG WITH A POLYPOID MASS (BIOPSIED) NOT FELT TO BE REMOVABLE COMPLETELY ENDOSCOPICALLY. COLON FROM [place] LINE PROXIMALLY NEEDS RESECTED TO INSURE REMOVAL OF PERSISTING LESIONS.  1. Colon, polyp(s), descending - TUBULAR ADENOMA (FIVE FRAGMENTS). - HYPERPLASTIC POLYP (ONE FRAGMENT). - NO HIGH GRADE DYSPLASIA OR MALIGNANCY. 2. Colon, biopsy, ascending colon polypoid mass - SUPERFICIAL FRAGMENTS OF TUBULOVILLOUS ADENOMA. - NO HIGH GRADE DYSPLASIA OR MALIGNANCY. Cannot exclude carcinoma being present, possibly not sampled, right hemicolectomy advised.  Medications   Current Outpatient Medications  Medication Sig Dispense Refill   aspirin EC 81 MG tablet Take 81 mg by mouth daily.     atorvastatin (LIPITOR) 80 MG tablet Take 1 tablet (80 mg total) by mouth every evening. 90 tablet 3   clopidogrel (PLAVIX) 75 MG tablet Take 1 tablet (75 mg total) by mouth daily. 30 tablet 6   ergocalciferol (VITAMIN D2) 1.25 MG (50000 UT) capsule Take 1 capsule (50,000 Units total) by mouth once a week. 5 capsule 6   Evolocumab (REPATHA SURECLICK) 245 MG/ML SOAJ Inject 1 pen into the skin  every 14 (fourteen) days. 2 mL 11   ferrous sulfate 325 (65 FE) MG EC tablet Take 1 tablet (325 mg total) by mouth daily with breakfast. 30 tablet 3   isosorbide mononitrate (IMDUR) 30 MG 24 hr tablet Take 1 tablet (30 mg total) by mouth every evening. 90 tablet 3   lisinopril (ZESTRIL) 10 MG tablet Take 10 mg by mouth daily.     metoprolol succinate (TOPROL-XL) 25 MG 24 hr tablet Take 1 tablet (25 mg total) by mouth every evening. 90 tablet 3    nitrofurantoin, macrocrystal-monohydrate, (MACROBID) 100 MG capsule Take 1 capsule (100 mg total) by mouth every 12 (twelve) hours. 14 capsule 0   nitroGLYCERIN (NITROSTAT) 0.4 MG SL tablet Place 0.4 mg under the tongue every 5 (five) minutes as needed for chest pain.     No current facility-administered medications for this visit.    Allergies   Allergies as of 06/16/2021   (No Known Allergies)     Past Medical History   Past Medical History:  Diagnosis Date   Carotid artery disease (HCC)    Chronic diastolic heart failure (HCC)    CKD (chronic kidney disease) stage 3, GFR 30-59 ml/min (HCC)    COPD (chronic obstructive pulmonary disease) (HCC)    Coronary artery disease    Essential hypertension    Head and neck cancer ([place]) 2019   Right facial basal cell carcinoma s/p resection with right partial mastectomy and partal rhinectomy with skin graft (06/13/17)   Iron deficiency anemia    Urinary retention     Past Surgical History   Past Surgical History:  Procedure Laterality Date   ANKLE CLOSED REDUCTION Right    open reduction   BASAL CELL CARC[facility]OMA EXCISION  2019   [facility]   COLONOSCOPY N/A 05/31/2017   Procedure: COLONOSCOPY;  Surgeon: Daneil Dolin, MD;  [facility];  Service: Endoscopy;  Laterality: N/A;  2:45pm   CYSTOSCOPY N/A 10/29/2018   Procedure: CYSTOSCOPY, CLOT EVACUATION;  Surgeon: Ceasar Mons, MD;  [facility];  Service: Urology;  Laterality: N/A;   POLYPECTOMY  05/31/2017   Procedure: POLYPECTOMY;  Surgeon: Daneil Dolin, MD;  [facility];  Service: Endoscopy;;   TONSILLECTOMY     TRANSCAROTID ARTERY REVASCULARIZATION  Right 02/15/2021   Procedure: RIGHT TRANSCAROTID ARTERY REVASCULARIZATION;  Surgeon: [name] Heck, MD;  Location: [facility];  Service: Vascular;  Laterality: Right;   ULTRASOUND GUIDANCE FOR VASCULAR ACCESS Left 02/15/2021   Procedure: ULTRASOUND GUIDANCE FOR VASCULAR ACCESS;  Surgeon: [name] Heck, MD;  Location: [place];  Service: Vascular;  Laterality: Left;   XI ROBOTIC ASSISTED SIMPLE PROSTATECTOMY N/A 10/29/2018   Procedure: XI ROBOTIC ASSISTED SIMPLE PROSTATECTOMY;  Surgeon: Cleon Gustin, MD;  [facility];  Service: Urology;  Laterality: N/A;    Past Family History   Family History  Problem Relation Age of Onset   Stroke Father    Cirrhosis Mother    Colon cancer Neg Hx     Past Social History   Social History   Socioeconomic History   Marital status: Married    Spouse name: Not on file   Number of children: Not on file   Years of education: Not on file   Highest education level: Not on file  Occupational History   Not on file  Tobacco Use   Smoking status: Former    Packs/day: 0.50    Years: 40.00    Pack  years: 20.00    Types: Cigarettes    Quit date: 12/01/2019    Years since quitting: 1.5    Passive exposure: Never   Smokeless tobacco: Never   Tobacco comments:    Quit on 06/09/21  Vaping Use   Vaping Use: Never used  Substance and Sexual Activity   Alcohol use: Not Currently   Drug use: Never   Sexual activity: Not Currently  Other Topics Concern   Not on file  Social History Narrative   Not on file   Social Determinants of Health   Financial Resource Strain: Not on file  Food Insecurity: Not on file  Transportation Needs: Not on file  Physical Activity: Not on file  Stress: Not on file  Social Connections: Not on file  Intimate Partner Violence: Not on file    Review of Systems   General: Negative for anorexia, weight loss, fever, chills, fatigue, weakness. ENT: Negative for hoarseness, difficulty swallowing , nasal congestion. CV: Negative for chest pain, angina, palpitations, dyspnea on exertion, peripheral edema.  Respiratory: Negative for dyspnea at rest, dyspnea on exertion, cough, sputum, wheezing.  GI: See history of present illness. GU:  Negative for dysuria, hematuria, urinary incontinence, urinary  frequency, nocturnal urination.  Endo: Negative for unusual weight change.     Physical Exam   BP 140/78 (BP Location: Right Arm, Patient Position: Sitting, Cuff Size: Normal)   Pulse (!) 40   Temp (!) 97.3 F (36.3 C) (Temporal)   Ht 6' (1.829 m)   Wt 162 lb 9.6 oz (73.8 kg)   SpO2 99%   BMI 22.05 kg/m    General: chronically ill appearing, well-developed in no acute distress.  Eyes: No icterus. Mouth: Oropharyngeal mucosa moist and pink , no lesions erythema or exudate. Lungs: Clear to auscultation bilaterally.  Heart: Regular rate and rhythm, no murmurs rubs or gallops.  Abdomen: Bowel sounds are normal, nontender, nondistended, no hepatosplenomegaly or masses,  no abdominal bruits or hernia , no rebound or guarding.  Rectal: not performed Extremities: No lower extremity edema. No clubbing or deformities. Neuro: Alert and oriented x 4   Skin: Warm and dry, no jaundice.   Psych: Alert and cooperative, normal mood and affect.  Labs   Lab Results  Component Value Date   CREATININE 1.92 (H) 02/18/2021   BUN 40 (H) 02/18/2021   NA 142 02/18/2021   K 4.4 02/18/2021   CL 116 (H) 02/18/2021   CO2 20 (L) 02/18/2021   Lab Results  Component Value Date   ALT 27 02/09/2021   AST 29 02/09/2021   ALKPHOS 164 (H) 02/09/2021   BILITOT 0.5 02/09/2021   Lab Results  Component Value Date   WBC 9.2 04/19/2021   HGB 12.0 (L) 04/19/2021   HCT 38.4 (L) 04/19/2021   MCV 89.1 04/19/2021   PLT 302 04/19/2021   Lab Results  Component Value Date   IRON 36 (L) 04/19/2021   TIBC 369 04/19/2021   FERRITIN 54 04/19/2021   Lab Results  Component Value Date   VITAMINB12 289 10/12/2020   Lab Results  Component Value Date   FOLATE 7.3 12/04/2019      Imaging Studies   No results found.  Assessment   66 year old male with past medical history significant for chronic kidney disease, COPD, head and neck cancer, CAD, chronic diastolic heart failure presenting at the request of  Ms. Pennington, PA-C with hematology for further evaluation of IDA, heme positive stool.  IDA/heme positive stool/rectal bleeding:  Patient has been receiving IV iron infusions for a couple of years.  Last year hematology completed 3 Hemoccults which were all positive.  Patient also admits to intermittent dark red blood in the stool.  He has been on Plavix since January 2023 with carotid artery stenting.  Patient has a history of colonoscopy in May 2019 at which time he was noted to have numerous polyps involving the descending colon which were removed and found to be tubular adenomas.  In addition he had multiple large and small polyps from the area of the hepatic flexure to the cecum along with a polypoid mass which was not felt to be removable completely endoscopically.  Biopsy from the polypoid mass was obtained and showed tubulovillous adenoma.  Patient was advised to undergo a right hemicolectomy for removal of numerous polyps and polypoid mass.  There was concern that there could be underlying malignancy, potentially not sampled.  Patient also had abnormality seen on PET scan prior to the colonoscopy.  Patient did not keep his surgery consultation appointment and unfortunately he was lost to follow-up.  Of note, patient underwent intravascular stent of the right carotid artery (TCAR) 01/2021 and is now on Plavix.   PLAN   Patient still needs to have a right hemicolectomy for management of numerous polyps and polypoid mass seen in 2019 which have not been addressed. Will discuss with Dr. Gala Romney, oncology. He has not had recent imaging. He may benefit from imaging, ?PET/CT. I'm not sure if updated colonoscopy will be needed first, although he probably is due for surveillance colonoscopy based on polyp load. May need left colon cleared again prior to surgery.    Laureen Ochs. Bobby Rumpf, [place], [facility]

## 2021-06-16 NOTE — Progress Notes (Signed)
06/16/2021 9:11 AM   [name] 1955-07-19 [id number]  Referring provider: [name], MD [address],  [place] [address]  Followup BPH and acute cystitis   HPI: Mr Michael [name] is 49NL here for followup for BPh and acute cystitis. IPSS 3 QOL 0 after simple prostatectomy. UA today is concerning for infection. No dysuria. NO feeling of incomplete emptying. No other complaints today.    PMH: Past Medical History:  Diagnosis Date   Carotid artery disease (HCC)    Chronic diastolic heart failure (HCC)    CKD (chronic kidney disease) stage 3, GFR 30-59 ml/min (HCC)    COPD (chronic obstructive pulmonary disease) (HCC)    Coronary artery disease    Essential hypertension    Head and neck cancer ([place]) 2019   Right facial basal cell carcinoma s/p resection with right partial mastectomy and partal rhinectomy with skin graft (06/13/17)   Iron deficiency anemia    Urinary retention     Surgical History: Past Surgical History:  Procedure Laterality Date   ANKLE CLOSED REDUCTION Right    open reduction   BASAL CELL CARCINOMA EXCISION  2019   [facility]   COLONOSCOPY N/A 05/31/2017   Procedure: COLONOSCOPY;  Surgeon: Daneil Dolin, MD;  [facility];  Service: Endoscopy;  Laterality: N/A;  2:45pm   CYSTOSCOPY N/A 10/29/2018   Procedure: CYSTOSCOPY, CLOT EVACUATION;  Surgeon: Ceasar Mons, MD;  [facility];  Service: Urology;  Laterality: N/A;   POLYPECTOMY  05/31/2017   Procedure: POLYPECTOMY;  Surgeon: Daneil Dolin, MD;  [facility];  Service: Endoscopy;;   TONSILLECTOMY     TRANSCAROTID ARTERY REVASCULARIZATION  Right 02/15/2021   Procedure: RIGHT TRANSCAROTID ARTERY REVASCULARIZATION;  Surgeon: [name] Heck, MD;  Location: [place];  Service: Vascular;  Laterality: Right;   ULTRASOUND GUIDANCE FOR VASCULAR ACCESS Left 02/15/2021   Procedure: ULTRASOUND GUIDANCE FOR VASCULAR ACCESS;  Surgeon: [name] Heck, MD;   Location: [facility];  Service: Vascular;  Laterality: Left;   XI ROBOTIC ASSISTED SIMPLE PROSTATECTOMY N/A 10/29/2018   Procedure: XI ROBOTIC ASSISTED SIMPLE PROSTATECTOMY;  Surgeon: Cleon Gustin, MD;  [facility];  Service: Urology;  Laterality: N/A;    Home Medications:  Allergies as of 06/16/2021   No Known Allergies      Medication List        Accurate as of Jun 16, 2021  9:11 AM. If you have any questions, ask your nurse or doctor.          aspirin EC 81 MG tablet Take 81 mg by mouth daily.   atorvastatin 80 MG tablet Commonly known as: LIPITOR Take 1 tablet (80 mg total) by mouth every evening.   clopidogrel 75 MG tablet Commonly known as: PLAVIX Take 1 tablet (75 mg total) by mouth daily.   ergocalciferol 1.25 MG (50000 UT) capsule Commonly known as: VITAMIN D2 Take 1 capsule (50,000 Units total) by mouth once a week.   ferrous sulfate 325 (65 FE) MG EC tablet Take 1 tablet (325 mg total) by mouth daily with breakfast.   isosorbide mononitrate 30 MG 24 hr tablet Commonly known as: IMDUR Take 1 tablet (30 mg total) by mouth every evening.   lisinopril 10 MG tablet Commonly known as: ZESTRIL Take 10 mg by mouth daily.   metoprolol succinate 25 MG 24 hr tablet Commonly known as: TOPROL-XL Take 1 tablet (25 mg total) by mouth every evening.  nitroGLYCERIN 0.4 MG SL tablet Commonly known as: NITROSTAT Place 0.4 mg under the tongue every 5 (five) minutes as needed for chest pain.   Repatha SureClick 983 MG/ML Soaj Generic drug: Evolocumab Inject 1 pen into the skin every 14 (fourteen) days.        Allergies: No Known Allergies  Family History: Family History  Problem Relation Age of Onset   Stroke Father    Cirrhosis Mother    Colon cancer Neg Hx     Social History:  reports that he has been smoking cigarettes. He has a 20.00 pack-year smoking history. He has never been exposed to tobacco smoke. He has never used smokeless tobacco. He  reports that he does not currently use alcohol. He reports that he does not use drugs.  ROS: All other review of systems were reviewed and are negative except what is noted above in HPI  Physical Exam: BP (!) 179/76   Pulse 76   Constitutional:  Alert and oriented, No acute distress. HEENT: [place] AT, moist mucus membranes.  Trachea midline, no masses. Cardiovascular: No clubbing, cyanosis, or edema. Respiratory: Normal respiratory effort, no increased work of breathing. GI: Abdomen is soft, nontender, nondistended, no abdominal masses GU: No CVA tenderness.  Lymph: No cervical or inguinal lymphadenopathy. Skin: No rashes, bruises or suspicious lesions. Neurologic: Grossly intact, no focal deficits, moving all 4 extremities. Psychiatric: Normal mood and affect.  Laboratory Data: Lab Results  Component Value Date   WBC 9.2 04/19/2021   HGB 12.0 (L) 04/19/2021   HCT 38.4 (L) 04/19/2021   MCV 89.1 04/19/2021   PLT 302 04/19/2021    Lab Results  Component Value Date   CREATININE 1.92 (H) 02/18/2021    Lab Results  Component Value Date   PSA 1.3 06/19/2019    No results found for: TESTOSTERONE  Lab Results  Component Value Date   HGBA1C 5.3 04/02/2017    Urinalysis    Component Value Date/Time   COLORURINE YELLOW 02/09/2021 1348   APPEARANCEUR CLEAR 02/09/2021 1348   APPEARANCEUR Cloudy (A) 06/17/2020 0931   LABSPEC 1.025 02/09/2021 1348   LABSPEC 1.018 04/23/2011 1044   PHURINE 6.0 02/09/2021 1348   GLUCOSEU NEGATIVE 02/09/2021 1348   GLUCOSEU Negative 04/23/2011 1044   HGBUR TRACE (A) 02/09/2021 1348   BILIRUBINUR NEGATIVE 02/09/2021 1348   BILIRUBINUR Negative 06/17/2020 0931   BILIRUBINUR Negative 04/23/2011 1044   KETONESUR NEGATIVE 02/09/2021 1348   PROTEINUR NEGATIVE 02/09/2021 1348   NITRITE POSITIVE (A) 02/09/2021 1348   LEUKOCYTESUR SMALL (A) 02/09/2021 1348   LEUKOCYTESUR Trace 04/23/2011 1044    Lab Results  Component Value Date   LABMICR See  below: 06/17/2020   WBCUA >30 (A) 06/17/2020   LABEPIT >10 (A) 06/17/2020   MUCUS Present 06/17/2020   BACTERIA MANY (A) 02/09/2021    Pertinent Imaging:  No results found for this or any previous visit.  No results found for this or any previous visit.  No results found for this or any previous visit.  No results found for this or any previous visit.  Results for orders placed during the hospital encounter of 04/01/17  US RENAL  Narrative CLINICAL DATA:  Acute kidney injury  EXAM: RENAL / URINARY TRACT ULTRASOUND COMPLETE  COMPARISON:  None.  FINDINGS: Right Kidney:  Length: 10 cm. Echogenicity within normal limits. 1.6 x 1.2 x 1.2 cm hypoechoic right renal mass without internal Doppler flow, posterior acoustic enhancement or posterior acoustic shadowing. No hydronephrosis visualized.  Left Kidney:  Length: 14.6 cm. Echogenicity within normal limits. No mass or hydronephrosis visualized.  Bladder:  Appears normal for degree of bladder distention.  IMPRESSION: 1. No obstructive uropathy. 2. 1.6 x 1.2 x 1.2 cm hypoechoic right renal mass of indeterminate etiology. Appearance is likely to reflect a mildly complicated cyst. Recommend nonemergent MRI of the abdomen for better characterization.   Electronically Signed By: Kathreen Devoid On: 04/02/2017 12:31  No results found for this or any previous visit.  Results for orders placed during the hospital encounter of 07/30/18  CT HEMATURIA WORKUP  Narrative CLINICAL DATA:  History of urolithiasis including bladder stone. Reported hematuria. Elevated PSA. Upcoming prostate biopsy.  EXAM: CT ABDOMEN AND PELVIS WITHOUT AND WITH CONTRAST  TECHNIQUE: Multidetector CT imaging of the abdomen and pelvis was performed following the standard protocol before and following the bolus administration of intravenous contrast.  CONTRAST:  122m ISOVUE-370 IOPAMIDOL (ISOVUE-370) INJECTION 76%  COMPARISON:   None.  FINDINGS: Lower chest: Tiny calcified granulomas in the basilar right lower lobe. Coronary atherosclerosis.  Hepatobiliary: Normal liver size. Simple 1.4 cm right liver dome cyst. A few additional scattered subcentimeter hypodense liver lesions, too small to characterize, which require no follow-up unless the patient has risk factors for liver malignancy. Cholelithiasis. No biliary ductal dilatation.  Pancreas: Normal, with no mass or duct dilation.  Spleen: Normal size. No mass.  Adrenals/Urinary Tract: Normal adrenals. No renal stones. Mild right hydroureteronephrosis to the level of the right ureterovesical junction. No ureteral stones. Normal caliber left ureter. No left hydronephrosis. Asymmetric moderate right renal parenchymal atrophy. Normal size left kidney. Partially duplicated left renal collecting system to the level of the mid left pelvic ureter. Smooth urothelial wall thickening and enhancement throughout the right renal collecting system and right ureter. No left urinary tract urothelial wall thickening. No discrete filling defects in the bilateral collecting system or ureters. Nonspecific diffuse bladder wall thickening. Solitary 3.0 cm posterior left bladder stone. No bladder diverticula. Mass-effect on the bladder base by the enlarged nodular median lobe of the prostate. Otherwise no focal bladder mass.  Stomach/Bowel: Normal non-distended stomach. Normal caliber small bowel with no small bowel wall thickening. Normal appendix. Normal large bowel with no diverticulosis, large bowel wall thickening or pericolonic fat stranding.  Vascular/Lymphatic: Atherosclerotic abdominal aorta with 3.3 cm infrarenal abdominal aortic aneurysm. Patent portal, splenic, hepatic and renal veins. Retroaortic left renal vein. Enlarged 1.7 cm right common iliac node (series 4/image 49). Enlarged 1.3 cm left common iliac node (series 4/image 49). Mild porta hepatis  adenopathy up to 1.4 cm (series 4/image 16).  Reproductive: Markedly enlarged prostate measuring 8.5 x 7.4 x 7.9 cm (258 cc).  Other: No pneumoperitoneum, ascites or focal fluid collection. Tiny fat containing umbilical hernia.  Musculoskeletal: No aggressive appearing focal osseous lesions. Mild thoracolumbar spondylosis.  IMPRESSION: 1. Markedly enlarged prostate with mass-effect on the bladder base by the enlarged nodular median lobe of the prostate. 2. Solitary large 3 cm bladder stone.  No renal or ureteral stones. 3. Mild right hydroureteronephrosis to the level of the right UVJ. Nonspecific smooth diffuse urothelial wall thickening and enhancement throughout the right renal collecting system and right ureter, more likely inflammatory. Asymmetric moderate right renal parenchymal atrophy suggests a longstanding process. Obstruction may be due to the nodular median lobe of the prostate, which slightly asymmetrically involves the right bladder base. 4. Mild porta hepatis and bilateral common iliac lymphadenopathy, nonspecific. Suggest attention on follow-up CT abdomen/pelvis with oral and IV contrast in 3 months.  This recommendation follows ACR consensus guidelines: White Paper of the ACR Incidental Findings Committee II on Splenic and Nodal Findings. J Am Coll Radiol 0802;23:361-224. 5. Infrarenal 3.3 cm Abdominal Aortic Aneurysm (ICD10-I71.9). Recommend follow-up aortic ultrasound in 3 years. This recommendation follows ACR consensus guidelines: White Paper of the ACR Incidental Findings Committee II on Vascular Findings. J Am Coll Radiol 2013; 10:789-794. 6.  Aortic Atherosclerosis (ICD10-I70.0).   Electronically Signed By: Ilona Sorrel M.D. On: 07/30/2018 10:33  No results found for this or any previous visit.   Assessment & Plan:    1. Benign prostatic hyperplasia with urinary obstruction -Patient doing well after simple prostatectomy - Urinalysis, Routine w  reflex microscopic  2. Urinary retention -resolved - BLADDER SCAN AMB NON-IMAGING  3. Acute cystitis without hematuria -urine for culture, will call with results. Macrobid '100mg'$  BID for 7 days. We will place him on prophylaxis based on his urine culture. - Urine Culture   No follow-ups on file.  Nicolette Bang, MD  [facility] [place]

## 2021-06-16 NOTE — Patient Instructions (Signed)
We will be in touch with additional recommendations regarding if you need a special xray or updated colonoscopy as next step. Ultimately you will need your right colon removed as you are at risk of getting colon cancer.  ?

## 2021-06-19 LAB — URINE CULTURE

## 2021-06-21 ENCOUNTER — Other Ambulatory Visit: Payer: Self-pay

## 2021-06-21 ENCOUNTER — Telehealth: Payer: Self-pay

## 2021-06-21 DIAGNOSIS — N3000 Acute cystitis without hematuria: Secondary | ICD-10-CM

## 2021-06-21 MED ORDER — SULFAMETHOXAZOLE-TRIMETHOPRIM 800-160 MG PO TABS: 1.0000 | ORAL_TABLET | Freq: Two times a day (BID) | ORAL | 0 refills | Status: DC

## 2021-06-21 NOTE — Telephone Encounter (Signed)
Patient called and made aware of abt change.

## 2021-06-21 NOTE — Telephone Encounter (Signed)
-----   Message from Cleon Gustin, MD sent at 06/21/2021 11:10 AM EDT ----- Bactrim DS BID for 7 days ----- Message ----- From: Iris Pert, LPN Sent: 4/47/3958   9:12 AM EDT To: Cleon Gustin, MD  Patient started on [facility]

## 2021-06-23 MED ORDER — TRIMETHOPRIM 100 MG PO TABS
100.0000 mg | ORAL_TABLET | Freq: Every day | ORAL | 11 refills | Status: DC
Start: 2021-06-23 — End: 2021-10-26

## 2021-06-30 ENCOUNTER — Telehealth: Payer: Self-pay | Admitting: Gastroenterology

## 2021-06-30 DIAGNOSIS — R948 Abnormal results of function studies of other organs and systems: Secondary | ICD-10-CM

## 2021-06-30 DIAGNOSIS — D500 Iron deficiency anemia secondary to blood loss (chronic): Secondary | ICD-10-CM

## 2021-06-30 DIAGNOSIS — K625 Hemorrhage of anus and rectum: Secondary | ICD-10-CM

## 2021-06-30 NOTE — Telephone Encounter (Signed)
Pt was made aware and verbalized understanding. Pt is willing to proceed with recommendations.

## 2021-06-30 NOTE — Telephone Encounter (Signed)
Please let pt know that Dr. Gala Romney, Dr. Delton Coombes, Dr. Constance Haw, and I have all collaborated regarding case. He has IDA requiring infusions, heme positive stool/rectal bleeding and prior colonoscopy in 2019 due to positive PET and was found to have numerous polyps and polypoid mass in the right colon and was advised for right colon resection at that time but he never completed.   At this time, he needs updated imaging as next step. Then would need colonoscopy prior to consideration of right hemicolectomy.    Please arrange for CT A/P with contrast (ASAP) Dx: IDA, rectal bleeding, h/o abnormal PET, ascending colon 2019, multiple polyps right colon including polypoid mass which could not be removed endoscopically and patient did not complete hemicolectomy as advised. Pathology with tubulovillous adenoma of mass but concern for underlying malignancy. Need updated imaging prior to colonoscopy/surgery.

## 2021-07-01 ENCOUNTER — Encounter (HOSPITAL_COMMUNITY): Payer: Self-pay | Admitting: Hematology

## 2021-07-01 NOTE — Telephone Encounter (Signed)
Spoke to pt, informed him of CT appt and details.

## 2021-07-01 NOTE — Addendum Note (Signed)
Addended by: Hassan Rowan on: 07/01/2021 08:03 AM   Modules accepted: Orders

## 2021-07-01 NOTE — Telephone Encounter (Signed)
No PA needed for CT abd/pelvis w/contrast per [facility] website.  CT scheduled for 07/05/21 arrive at 12:00pm. Needs creatinine prior to CT. Pickup contrast before day of test. NPO 4 hours prior to test.  Tried to call pt, no answer. Voicemail is full.

## 2021-07-01 NOTE — Addendum Note (Signed)
Addended by: Hassan Rowan on: 07/01/2021 08:09 AM   Modules accepted: Orders

## 2021-07-02 ENCOUNTER — Ambulatory Visit (HOSPITAL_BASED_OUTPATIENT_CLINIC_OR_DEPARTMENT_OTHER): Payer: Medicare Other

## 2021-07-02 NOTE — Telephone Encounter (Signed)
Received VM from [facility] that patient Michael [name] requires PA. PA approved. Auth# [id number],. DOS: Jul 02 2021 - Aug 01 2021

## 2021-07-05 ENCOUNTER — Ambulatory Visit (HOSPITAL_COMMUNITY)
Admission: RE | Admit: 2021-07-05 | Discharge: 2021-07-05 | Disposition: A | Discharge status: Home / Self Care | Payer: Medicare HMO | Source: Ambulatory Visit | Attending: Gastroenterology | Admitting: Gastroenterology

## 2021-07-05 ENCOUNTER — Encounter (HOSPITAL_COMMUNITY): Payer: Self-pay

## 2021-07-05 ENCOUNTER — Telehealth: Payer: Self-pay

## 2021-07-05 DIAGNOSIS — D500 Iron deficiency anemia secondary to blood loss (chronic): Secondary | ICD-10-CM | POA: Diagnosis not present

## 2021-07-05 DIAGNOSIS — R948 Abnormal results of function studies of other organs and systems: Secondary | ICD-10-CM | POA: Insufficient documentation

## 2021-07-05 DIAGNOSIS — K625 Hemorrhage of anus and rectum: Secondary | ICD-10-CM | POA: Diagnosis present

## 2021-07-05 LAB — POCT I-STAT CREATININE: Creatinine, Ser: 1.8 mg/dL — ABNORMAL HIGH (ref 0.61–1.24)

## 2021-07-05 MED ORDER — IOHEXOL 300 MG/ML  SOLN
100.0000 mL | Freq: Once | INTRAMUSCULAR | Status: AC | PRN
  Administered 2021-07-05: 80 mL via INTRAVENOUS

## 2021-07-05 NOTE — Telephone Encounter (Signed)
[facility] radiology called to make you aware of patient's CT Results being on Epic. Results are in patient's chart.

## 2021-07-06 NOTE — Telephone Encounter (Signed)
Noted  

## 2021-07-06 NOTE — Telephone Encounter (Signed)
Thanks. They have already been addressed.

## 2021-07-07 ENCOUNTER — Telehealth: Payer: Self-pay | Admitting: Gastroenterology

## 2021-07-07 NOTE — Telephone Encounter (Signed)
Spoke with Dr. Abbey Chatters who will be doing the colonoscopy for Dr. Gala Romney. He prefers to have plavix held for 5 days before colonoscopy if possible but he has had recent carotid stent 01/2021. Patient at increased risk of bleeding due to known polyps/mass.   Please find out if plavix can be held, ask vascular. Seen last by Leontine Locket PA-C

## 2021-07-08 ENCOUNTER — Encounter: Payer: Self-pay | Admitting: *Deleted

## 2021-07-08 ENCOUNTER — Telehealth: Payer: Self-pay

## 2021-07-08 NOTE — Telephone Encounter (Signed)
Telephone message has been sent to [facility] requesting clearance.

## 2021-07-08 NOTE — Telephone Encounter (Signed)
Dr. Abbey Chatters would like to request holding the following medication for patient please.  Procedure: Colonoscopy  Date: 07/19/2021  Medication to hold: Plavix x 5 days  Surgeon: Dr. Abbey Chatters  Phone: [phone]  Fax:  [phone]  Type of Anesthesia: Propofol  Samantha, Dr. Abbey Chatters prefers to have plavix held for 5 days before colonoscopy, if possible, but this patient has had recent carotid stent 01/2021. Patient is at increased risk of bleeding due to known polyps/mass. Would you be ok with this?

## 2021-07-09 NOTE — Telephone Encounter (Signed)
Really not requesting clearance for procedure just approval to hold plavix if able. If not, we will have to do procedure on plavix. Need to know asap as his procedure is scheduled 07/19/21. I will be out of town so need someone to follow up on Monday and get answer so he will have time to hold if appropriate.   Clayborn Heron, please help follow up while out.

## 2021-07-09 NOTE — Telephone Encounter (Signed)
Faxed a letter requesting clearance to hold plavix x 5 days as requested as well.

## 2021-07-12 NOTE — Telephone Encounter (Signed)
Noted. See other telephone. Pt aware.

## 2021-07-12 NOTE — Telephone Encounter (Signed)
Called and he has been made aware ok to hold plavix x5 days and will do so

## 2021-07-12 NOTE — Telephone Encounter (Signed)
FYI to [employer].  Called pt, VM full, WCB

## 2021-07-12 NOTE — Telephone Encounter (Signed)
See prior phone note. Okay given to hold plavix x 5 days

## 2021-07-13 ENCOUNTER — Telehealth: Payer: Self-pay

## 2021-07-13 ENCOUNTER — Telehealth: Payer: Self-pay | Admitting: *Deleted

## 2021-07-13 NOTE — Telephone Encounter (Signed)
PA approved via [employer] website. Auth# [id number], DOS: 07/19/2021 - 10/17/2021

## 2021-07-13 NOTE — Telephone Encounter (Signed)
Our office received a Clearance request for pt to hold Plavix 5 days prior to scheduled Colonoscopy with polyps/mass removal on 07/19/21. I spoke with our on call doctor Dr. Scot Dock to review pts MR. Dr. Scot Dock indicated that pt is clear to hold Plavix starting tomorrow 07/14/21 and is able to resume once Gastroenterology advises pt to do so. I notified pt, he voiced his understanding.

## 2021-07-14 NOTE — Telephone Encounter (Signed)
noted 

## 2021-07-15 NOTE — Patient Instructions (Addendum)
Michael [name]  07/15/2021     '@PREFPERIOPPHARMACY'$ @   Your procedure is scheduled on  07/19/2021.   Report to [name] at  0700  A.M.   Call this number if you have problems the morning of surgery:  [phone]   Remember:  Follow the diet and prep instructions given to you by the office.     Your last dose of iron should have been on 6/12 and your last dose of plavix should have been on 6/14.      Take these medicines the morning of surgery with A SIP OF WATER : Toprol and Isosorbide                                         Do not wear jewelry, make-up or nail polish.  Do not wear lotions, powders, or perfumes, or deodorant.  Do not shave 48 hours prior to surgery.  Men may shave face and neck.  Do not bring valuables to the hospital.  [facility] is not responsible for any belongings or valuables.  Contacts, dentures or bridgework may not be worn into surgery.  Leave your suitcase in the car.  After surgery it may be brought to your room.  For patients admitted to the hospital, discharge time will be determined by your treatment team.  Patients discharged the day of surgery will not be allowed to drive home and must  have someone with them for 24 hours.    Special instructions:   DO NOT smoke tobacco or vape for 24 hours before your procedure.  Please read over the following fact sheets that you were given. Anesthesia Post-op Instructions and Care and Recovery After Surgery       Colonoscopy, Adult, Care After The following information offers guidance on how to care for yourself after your procedure. Your health care provider may also give you more specific instructions. If you have problems or questions, contact your health care provider. What can I expect after the procedure? After the procedure, it is common to have: A small amount of blood in your stool for 24 hours after the procedure. Some gas. Mild cramping or bloating of your abdomen. Follow  these instructions at home: Eating and drinking  Drink enough fluid to keep your urine pale yellow. Follow instructions from your health care provider about eating or drinking restrictions. Resume your normal diet as told by your health care provider. Avoid heavy or fried foods that are hard to digest. Activity Rest as told by your health care provider. Avoid sitting for a long time without moving. Get up to take short walks every 1-2 hours. This is important to improve blood flow and breathing. Ask for help if you feel weak or unsteady. Return to your normal activities as told by your health care provider. Ask your health care provider what activities are safe for you. Managing cramping and bloating  Try walking around when you have cramps or feel bloated. If directed, apply heat to your abdomen as told by your health care provider. Use the heat source that your health care provider recommends, such as a moist heat pack or a heating pad. Place a towel between your skin and the heat source. Leave the heat on for 20-30 minutes. Remove the heat if your skin turns bright red. This is especially important if you are unable to  feel pain, heat, or cold. You have a greater risk of getting burned. General instructions If you were given a sedative during the procedure, it can affect you for several hours. Do not drive or operate machinery until your health care provider says that it is safe. For the first 24 hours after the procedure: Do not sign important documents. Do not drink alcohol. Do your regular daily activities at a slower pace than normal. Eat soft foods that are easy to digest. Take over-the-counter and prescription medicines only as told by your health care provider. Keep all follow-up visits. This is important. Contact a health care provider if: You have blood in your stool 2-3 days after the procedure. Get help right away if: You have more than a small spotting of blood in your  stool. You have large blood clots in your stool. You have swelling of your abdomen. You have nausea or vomiting. You have a fever. You have increasing pain in your abdomen that is not relieved with medicine. These symptoms may be an emergency. Get help right away. Call 911. Do not wait to see if the symptoms will go away. Do not drive yourself to the hospital. Summary After the procedure, it is common to have a small amount of blood in your stool. You may also have mild cramping and bloating of your abdomen. If you were given a sedative during the procedure, it can affect you for several hours. Do not drive or operate machinery until your health care provider says that it is safe. Get help right away if you have a lot of blood in your stool, nausea or vomiting, a fever, or increased pain in your abdomen. This information is not intended to replace advice given to you by your health care provider. Make sure you discuss any questions you have with your health care provider. Document Revised: 09/09/2020 Document Reviewed: 09/09/2020 Elsevier Patient Education  [place] After This sheet gives you information about how to care for yourself after your procedure. Your health care provider may also give you more specific instructions. If you have problems or questions, contact your health care provider. What can I expect after the procedure? After the procedure, it is common to have: Tiredness. Forgetfulness about what happened after the procedure. Impaired judgment for important decisions. Nausea or vomiting. Some difficulty with balance. Follow these instructions at home: For the time period you were told by your health care provider:     Rest as needed. Do not participate in activities where you could fall or become injured. Do not drive or use machinery. Do not drink alcohol. Do not take sleeping pills or medicines that cause drowsiness. Do  not make important decisions or sign legal documents. Do not take care of children on your own. Eating and drinking Follow the diet that is recommended by your health care provider. Drink enough fluid to keep your urine pale yellow. If you vomit: Drink water, juice, or soup when you can drink without vomiting. Make sure you have little or no nausea before eating solid foods. General instructions Have a responsible adult stay with you for the time you are told. It is important to have someone help care for you until you are awake and alert. Take over-the-counter and prescription medicines only as told by your health care provider. If you have sleep apnea, surgery and certain medicines can increase your risk for breathing problems. Follow instructions from your health care provider about wearing  your sleep device: Anytime you are sleeping, including during daytime naps. While taking prescription pain medicines, sleeping medicines, or medicines that make you drowsy. Avoid smoking. Keep all follow-up visits as told by your health care provider. This is important. Contact a health care provider if: You keep feeling nauseous or you keep vomiting. You feel light-headed. You are still sleepy or having trouble with balance after 24 hours. You develop a rash. You have a fever. You have redness or swelling around the IV site. Get help right away if: You have trouble breathing. You have new-onset confusion at home. Summary For several hours after your procedure, you may feel tired. You may also be forgetful and have poor judgment. Have a responsible adult stay with you for the time you are told. It is important to have someone help care for you until you are awake and alert. Rest as told. Do not drive or operate machinery. Do not drink alcohol or take sleeping pills. Get help right away if you have trouble breathing, or if you suddenly become confused. This information is not intended to replace  advice given to you by your health care provider. Make sure you discuss any questions you have with your health care provider. Document Revised: 12/22/2020 Document Reviewed: 12/20/2018 Elsevier Patient Education  [place].

## 2021-07-16 ENCOUNTER — Other Ambulatory Visit: Payer: Self-pay

## 2021-07-16 ENCOUNTER — Encounter (HOSPITAL_COMMUNITY)
Admission: RE | Admit: 2021-07-16 | Discharge: 2021-07-16 | Disposition: A | Discharge status: Home / Self Care | Payer: Medicare HMO | Source: Ambulatory Visit | Attending: Internal Medicine | Admitting: Internal Medicine

## 2021-07-16 ENCOUNTER — Encounter (HOSPITAL_COMMUNITY): Payer: Self-pay

## 2021-07-16 VITALS — BP 179/99 | HR 82 | Temp 97.8°F | Resp 18 | Ht 72.0 in | Wt 162.7 lb

## 2021-07-16 DIAGNOSIS — N170 Acute kidney failure with tubular necrosis: Secondary | ICD-10-CM | POA: Diagnosis not present

## 2021-07-16 DIAGNOSIS — I129 Hypertensive chronic kidney disease with stage 1 through stage 4 chronic kidney disease, or unspecified chronic kidney disease: Secondary | ICD-10-CM | POA: Insufficient documentation

## 2021-07-16 DIAGNOSIS — N182 Chronic kidney disease, stage 2 (mild): Secondary | ICD-10-CM | POA: Diagnosis not present

## 2021-07-16 DIAGNOSIS — I10 Essential (primary) hypertension: Secondary | ICD-10-CM

## 2021-07-16 DIAGNOSIS — Z01818 Encounter for other preprocedural examination: Secondary | ICD-10-CM | POA: Insufficient documentation

## 2021-07-16 DIAGNOSIS — D500 Iron deficiency anemia secondary to blood loss (chronic): Secondary | ICD-10-CM | POA: Diagnosis not present

## 2021-07-16 HISTORY — DX: Cardiac arrhythmia, unspecified: I49.9

## 2021-07-16 LAB — CBC WITH DIFFERENTIAL/PLATELET
Abs Immature Granulocytes: 0.03 10*3/uL (ref 0.00–0.07)
Basophils Absolute: 0.1 10*3/uL (ref 0.0–0.1)
Basophils Relative: 1 %
Eosinophils Absolute: 0.3 10*3/uL (ref 0.0–0.5)
Eosinophils Relative: 3 %
HCT: 36 % — ABNORMAL LOW (ref 39.0–52.0)
Hemoglobin: 11 g/dL — ABNORMAL LOW (ref 13.0–17.0)
Immature Granulocytes: 0 %
Lymphocytes Relative: 21 %
Lymphs Abs: 2.1 10*3/uL (ref 0.7–4.0)
MCH: 26.7 pg (ref 26.0–34.0)
MCHC: 30.6 g/dL (ref 30.0–36.0)
MCV: 87.4 fL (ref 80.0–100.0)
Monocytes Absolute: 1 10*3/uL (ref 0.1–1.0)
Monocytes Relative: 10 %
Neutro Abs: 6.4 10*3/uL (ref 1.7–7.7)
Neutrophils Relative %: 65 %
Platelets: 337 10*3/uL (ref 150–400)
RBC: 4.12 MIL/uL — ABNORMAL LOW (ref 4.22–5.81)
RDW: 14.3 % (ref 11.5–15.5)
WBC: 10 10*3/uL (ref 4.0–10.5)
nRBC: 0 % (ref 0.0–0.2)

## 2021-07-16 LAB — BASIC METABOLIC PANEL
Anion gap: 7 (ref 5–15)
BUN: 23 mg/dL (ref 8–23)
CO2: 24 mmol/L (ref 22–32)
Calcium: 8.6 mg/dL — ABNORMAL LOW (ref 8.9–10.3)
Chloride: 109 mmol/L (ref 98–111)
Creatinine, Ser: 1.54 mg/dL — ABNORMAL HIGH (ref 0.61–1.24)
GFR, Estimated: 49 mL/min — ABNORMAL LOW (ref >60)
Glucose, Bld: 57 mg/dL — ABNORMAL LOW (ref 70–99)
Potassium: 3.2 mmol/L — ABNORMAL LOW (ref 3.5–5.1)
Sodium: 140 mmol/L (ref 135–145)

## 2021-07-19 ENCOUNTER — Ambulatory Visit (HOSPITAL_COMMUNITY)
Admission: RE | Admit: 2021-07-19 | Discharge: 2021-07-19 | Disposition: A | Discharge status: Home / Self Care | Payer: Medicare HMO | Attending: Internal Medicine | Admitting: Internal Medicine

## 2021-07-19 ENCOUNTER — Ambulatory Visit (HOSPITAL_BASED_OUTPATIENT_CLINIC_OR_DEPARTMENT_OTHER): Payer: Medicare HMO | Admitting: Anesthesiology

## 2021-07-19 ENCOUNTER — Encounter (HOSPITAL_COMMUNITY)
Admission: RE | Disposition: A | Discharge status: Home / Self Care | Payer: Self-pay | Source: Home / Self Care | Attending: Internal Medicine

## 2021-07-19 ENCOUNTER — Ambulatory Visit (HOSPITAL_COMMUNITY): Payer: Medicare HMO | Admitting: Anesthesiology

## 2021-07-19 DIAGNOSIS — I2510 Atherosclerotic heart disease of native coronary artery without angina pectoris: Secondary | ICD-10-CM | POA: Insufficient documentation

## 2021-07-19 DIAGNOSIS — N183 Chronic kidney disease, stage 3 unspecified: Secondary | ICD-10-CM | POA: Insufficient documentation

## 2021-07-19 DIAGNOSIS — J449 Chronic obstructive pulmonary disease, unspecified: Secondary | ICD-10-CM | POA: Insufficient documentation

## 2021-07-19 DIAGNOSIS — I5032 Chronic diastolic (congestive) heart failure: Secondary | ICD-10-CM | POA: Insufficient documentation

## 2021-07-19 DIAGNOSIS — K639 Disease of intestine, unspecified: Secondary | ICD-10-CM | POA: Diagnosis present

## 2021-07-19 DIAGNOSIS — I739 Peripheral vascular disease, unspecified: Secondary | ICD-10-CM | POA: Diagnosis not present

## 2021-07-19 DIAGNOSIS — D120 Benign neoplasm of cecum: Secondary | ICD-10-CM | POA: Insufficient documentation

## 2021-07-19 DIAGNOSIS — K648 Other hemorrhoids: Secondary | ICD-10-CM | POA: Diagnosis not present

## 2021-07-19 DIAGNOSIS — K635 Polyp of colon: Secondary | ICD-10-CM | POA: Diagnosis not present

## 2021-07-19 DIAGNOSIS — D122 Benign neoplasm of ascending colon: Secondary | ICD-10-CM | POA: Diagnosis not present

## 2021-07-19 DIAGNOSIS — Z85828 Personal history of other malignant neoplasm of skin: Secondary | ICD-10-CM | POA: Insufficient documentation

## 2021-07-19 DIAGNOSIS — R933 Abnormal findings on diagnostic imaging of other parts of digestive tract: Secondary | ICD-10-CM | POA: Diagnosis present

## 2021-07-19 DIAGNOSIS — D490 Neoplasm of unspecified behavior of digestive system: Secondary | ICD-10-CM | POA: Diagnosis not present

## 2021-07-19 DIAGNOSIS — Z87891 Personal history of nicotine dependence: Secondary | ICD-10-CM

## 2021-07-19 DIAGNOSIS — K625 Hemorrhage of anus and rectum: Secondary | ICD-10-CM

## 2021-07-19 DIAGNOSIS — I130 Hypertensive heart and chronic kidney disease with heart failure and stage 1 through stage 4 chronic kidney disease, or unspecified chronic kidney disease: Secondary | ICD-10-CM | POA: Insufficient documentation

## 2021-07-19 DIAGNOSIS — Z86010 Personal history of colonic polyps: Secondary | ICD-10-CM

## 2021-07-19 SURGERY — COLONOSCOPY WITH PROPOFOL
Anesthesia: General

## 2021-07-19 MED ORDER — PROPOFOL 10 MG/ML IV BOLUS
INTRAVENOUS | Status: DC | PRN
  Administered 2021-07-19 (×2): 20 mg via INTRAVENOUS
  Administered 2021-07-19: 80 mg via INTRAVENOUS
  Administered 2021-07-19: 50 mg via INTRAVENOUS

## 2021-07-19 MED ORDER — LIDOCAINE HCL (CARDIAC) PF 100 MG/5ML IV SOSY
PREFILLED_SYRINGE | INTRAVENOUS | Status: DC | PRN
  Administered 2021-07-19: 50 mg via INTRAVENOUS

## 2021-07-19 MED ORDER — LACTATED RINGERS IV SOLN: INTRAVENOUS | Status: DC

## 2021-07-19 NOTE — Anesthesia Postprocedure Evaluation (Signed)
Anesthesia Post Note  Patient: Michael [name]  Procedure(s) Performed: COLONOSCOPY WITH PROPOFOL BIOPSY  Patient location during evaluation: Phase II Anesthesia Type: General Level of consciousness: awake and alert Pain management: pain level controlled Vital Signs Assessment: post-procedure vital signs reviewed and stable Respiratory status: spontaneous breathing, nonlabored ventilation and respiratory function stable Cardiovascular status: blood pressure returned to baseline and stable Postop Assessment: no apparent nausea or vomiting Anesthetic complications: no   No notable events documented.   Last Vitals:  Vitals:   07/19/21 0800 07/19/21 0907  BP: (!) 202/85 138/81  Pulse: 76 78  Resp: 14 18  Temp:  37 C  SpO2: 100% 95%    Last Pain:  Vitals:   07/19/21 0907  TempSrc: Oral  PainSc: 0-No pain                 Najah Liverman C Delmon Andrada

## 2021-07-19 NOTE — Transfer of Care (Signed)
Immediate Anesthesia Transfer of Care Note  Patient: Michael [name]  Procedure(s) Performed: COLONOSCOPY WITH PROPOFOL BIOPSY  Patient Location: Short Stay  Anesthesia Type:General  Level of Consciousness: awake and patient cooperative  Airway & Oxygen Therapy: Patient Spontanous Breathing  Post-op Assessment: Report given to RN and Post -op Vital signs reviewed and stable  Post vital signs: Reviewed and stable  Last Vitals:  Vitals Value Taken Time  BP 120/45 0907  Temp 37 0907  Pulse 78 0907  Resp 18 0907  SpO2 95 0907    Last Pain:  Vitals:   07/19/21 0841  PainSc: 0-No pain         Complications: No notable events documented.

## 2021-07-19 NOTE — Discharge Instructions (Signed)
  Colonoscopy Discharge Instructions  Read the instructions outlined below and refer to this sheet in the next few weeks. These discharge instructions provide you with general information on caring for yourself after you leave the hospital. Your doctor may also give you specific instructions. While your treatment has been planned according to the most current medical practices available, unavoidable complications occasionally occur.   ACTIVITY You may resume your regular activity, but move at a slower pace for the next 24 hours.  Take frequent rest periods for the next 24 hours.  Walking will help get rid of the air and reduce the bloated feeling in your belly (abdomen).  No driving for 24 hours (because of the medicine (anesthesia) used during the test).   Do not sign any important legal documents or operate any machinery for 24 hours (because of the anesthesia used during the test).  NUTRITION Drink plenty of fluids.  You may resume your normal diet as instructed by your doctor.  Begin with a light meal and progress to your normal diet. Heavy or fried foods are harder to digest and may make you feel sick to your stomach (nauseated).  Avoid alcoholic beverages for 24 hours or as instructed.  MEDICATIONS You may resume your normal medications unless your doctor tells you otherwise.  WHAT YOU CAN EXPECT TODAY Some feelings of bloating in the abdomen.  Passage of more gas than usual.  Spotting of blood in your stool or on the toilet paper.  IF YOU HAD POLYPS REMOVED DURING THE COLONOSCOPY: No aspirin products for 7 days or as instructed.  No alcohol for 7 days or as instructed.  Eat a soft diet for the next 24 hours.  FINDING OUT THE RESULTS OF YOUR TEST Not all test results are available during your visit. If your test results are not back during the visit, make an appointment with your caregiver to find out the results. Do not assume everything is normal if you have not heard from your  caregiver or the medical facility. It is important for you to follow up on all of your test results.  SEEK IMMEDIATE MEDICAL ATTENTION IF: You have more than a spotting of blood in your stool.  Your belly is swollen (abdominal distention).  You are nauseated or vomiting.  You have a temperature over 101.  You have abdominal pain or discomfort that is severe or gets worse throughout the day.   I was able to visualize the mass in the right side your colon that we saw on previous colonoscopy as well as CT scan.  I rebiopsied it today.  Next step will be to discuss surgical resection.  I will discuss further with Dr. Constance Haw.  Repeat colonoscopy in 1 year.  I hope you have a great rest of your week!  Michael [name]. Abbey Chatters, D.O. Gastroenterology and Hepatology [facility]\

## 2021-07-19 NOTE — Addendum Note (Signed)
Addendum  created 07/19/21 1513 by Denese Killings, MD   Clinical Note Signed

## 2021-07-19 NOTE — Anesthesia Preprocedure Evaluation (Signed)
Anesthesia Evaluation  Patient identified by MRN, date of birth, ID band Patient awake    Reviewed: Allergy & Precautions, NPO status , Patient's Chart, lab work & pertinent test results, reviewed documented beta blocker date and time   Airway Mallampati: III  TM Distance: >3 FB Neck ROM: Full  Mouth opening: Limited Mouth Opening Comment: Head and neck cancer ([place]) 2019 Right facial basal cell carcinoma s/p resection with right partial mastectomy and partal rhinectomy with skin graft (06/13/17)  Dental  (+) Missing, Loose,    Pulmonary pneumonia, COPD,  COPD inhaler, Patient abstained from smoking., former smoker,  Head and neck cancer ([place]) 2019 Right facial basal cell carcinoma s/p resection with right partial mastectomy and partal rhinectomy with skin graft (06/13/17)    Pulmonary exam normal breath sounds clear to auscultation       Cardiovascular Exercise Tolerance: Good hypertension, Pt. on home beta blockers and Pt. on medications + CAD, + Peripheral Vascular Disease (carotid revascularization) and +CHF  + dysrhythmias + Valvular Problems/Murmurs  Rhythm:Irregular Rate:Normal  2019- stress test Narrative & Impression Findings consistent with prior inferior/septal myocardial infarction with mild peri-infarct ischemia. This is a high risk study. Risk primarily based on decreased LVEF. There is a prior infarct with only mild current ischemia. Consider correlating LVEF with echocardiogram. The left ventricular ejection fraction is moderately decreased (33%).   16-Jul-2021 09:12:26 [place] System-AP-300 ROUTINE RECORD [phone] (42 yr) Male Caucasian Vent. rate 79 BPM PR interval 276 ms QRS duration 110 ms QT/QTcB 424/486 ms P-R-T axes 88 -34 141 Sinus rhythm with 1st degree A-V block with Premature atrial complexes Left axis deviation Left ventricular hypertrophy with repolarization abnormality ( Sokolow-Lyon  ) Cannot rule out Septal infarct , age undetermined Abnormal ECG Since last tracing Premature ventricular complexes NO LONGER PRESENT Confirmed by Skeet Latch [phone]) on 07/18/2021 2:09:20 PM Referred by: Jani Gravel Confirmed By: Skeet Latch    Neuro/Psych negative neurological ROS  negative psych ROS   GI/Hepatic negative GI ROS, Neg liver ROS,   Endo/Other  negative endocrine ROS  Renal/GU Renal disease  negative genitourinary   Musculoskeletal negative musculoskeletal ROS (+)   Abdominal   Peds negative pediatric ROS (+)  Hematology  (+) Blood dyscrasia, anemia ,   Anesthesia Other Findings 2019 - Study Conclusions   - Left ventricle: The cavity size was normal. Wall thickness was  increased in a pattern of mild LVH. Systolic function was mildly  reduced. The estimated ejection fraction was in the range of 45%  to 50%. There is hypokinesis of the basal-midinferolateral  myocardium. Features are consistent with a pseudonormal left  ventricular filling pattern, with concomitant abnormal relaxation  and increased filling pressure (grade 2 diastolic dysfunction).  - Aortic valve: Mildly calcified annulus. Trileaflet; mildly  calcified leaflets.  - Mitral valve: Mildly calcified annulus. There was mild  regurgitation.  - Left atrium: The atrium was mildly dilated.  - Tricuspid valve: There was trivial regurgitation.  - Pulmonary arteries: Systolic pressure could not be accurately  estimated.  - Pericardium, extracardiac: A trivial pericardial effusion was  identified anterior to the heart.   Reproductive/Obstetrics negative OB ROS                          Anesthesia Physical Anesthesia Plan  ASA: 3  Anesthesia Plan: General   Post-op Pain Management: Minimal or no pain anticipated   Induction: Intravenous  PONV Risk Score and Plan: Propofol infusion  Airway Management Planned: Nasal Cannula and Natural  Airway  Additional Equipment:   Intra-op Plan:   Post-operative Plan:   Informed Consent: I have reviewed the patients History and Physical, chart, labs and discussed the procedure including the risks, benefits and alternatives for the proposed anesthesia with the patient or authorized representative who has indicated his/her understanding and acceptance.     Dental advisory given  Plan Discussed with: CRNA and Surgeon  Anesthesia Plan Comments:        Anesthesia Quick Evaluation

## 2021-07-19 NOTE — Progress Notes (Signed)
Trigeminy PVC's. Dr Charna Elizabeth aware. No new orders at this time.

## 2021-07-19 NOTE — Op Note (Signed)
[facility] Patient Name: Michael [name] Procedure Date: 07/19/2021 8:28 AM MRN: [id number] Date of Birth: 15-Dec-1955 Attending MD: Elon Alas. Abbey Chatters DO CSN: [id number] Age: 66 Admit Type: Outpatient Procedure:                Colonoscopy Indications:              Abnormal CT of the GI tract, R sided colon mass Providers:                Elon Alas. Abbey Chatters, DO, Charlsie Quest. Theda Sers RN, RN,                            Everardo Pacific, Randa Spike, Technician Referring MD:              Medicines:                See the Anesthesia note for documentation of the                            administered medications Complications:            No immediate complications. Estimated Blood Loss:     Estimated blood loss was minimal. Procedure:                Pre-Anesthesia Assessment:                           - The anesthesia plan was to use monitored                            anesthesia care (MAC).                           After obtaining informed consent, the colonoscope                            was passed under direct vision. Throughout the                            procedure, the patient's blood pressure, pulse, and                            oxygen saturations were monitored continuously. The                            [id number]-HQ190L ([id number]) scope was introduced through                            the anus and advanced to the the cecum, identified                            by appendiceal orifice and ileocecal valve. The                            colonoscopy was performed without difficulty. The  patient tolerated the procedure well. The quality                            of the bowel preparation was evaluated using the                            BBPS [facility] Bowel Preparation Scale) with scores                            of: Right Colon = 2 (minor amount of residual                            staining, small fragments of stool and/or opaque                             liquid, but mucosa seen well), Transverse Colon = 2                            (minor amount of residual staining, small fragments                            of stool and/or opaque liquid, but mucosa seen                            well) and Left Colon = 2 (minor amount of residual                            staining, small fragments of stool and/or opaque                            liquid, but mucosa seen well). The total BBPS score                            equals 6. The quality of the bowel preparation was                            fair. Scope In: 8:45:11 AM Scope Out: 9:00:24 AM Scope Withdrawal Time: 0 hours 8 minutes 48 seconds  Total Procedure Duration: 0 hours 15 minutes 13 seconds  Findings:      The perianal and digital rectal examinations were normal.      Non-bleeding internal hemorrhoids were found during endoscopy.      A tattoo was seen in the proximal transverse colon. The tattoo site       appeared normal.      A frond-like/villous, fungating, infiltrative and polypoid       non-obstructing large mass was found in the ascending colon. No bleeding       was present. Biopsies were taken with a cold forceps for histology.      Multiple sessile polyps were found in the ascending colon and cecum. The       polyps were 10 to 20 mm in size. Impression:               - Preparation of the colon was fair.                           -  Non-bleeding internal hemorrhoids.                           - A tattoo was seen in the proximal transverse                            colon. The tattoo site appeared normal.                           - Likely malignant tumor in the ascending colon.                            Biopsied.                           - Multiple 10 to 20 mm polyps in the ascending                            colon and in the cecum. Moderate Sedation:      Per Anesthesia Care Recommendation:           - Patient has a contact number available for                             emergencies. The signs and symptoms of potential                            delayed complications were discussed with the                            patient. Return to normal activities tomorrow.                            Written discharge instructions were provided to the                            patient.                           - Resume previous diet.                           - Continue present medications.                           - Await pathology results.                           - Repeat colonoscopy for surveillance based on                            pathology results.                           - Tattoo remains in good location for surgical  resection. R hemicolectomy to this point would                            encompass mass and all other polyps. No other                            polyps or lesions identitifed distal to tattoo.                            Will discuss further with Dr. Constance Haw. Procedure Code(s):        --- Professional ---                           [phone], Colonoscopy, flexible; with biopsy, single                            or multiple Diagnosis Code(s):        --- Professional ---                           K64.8, Other hemorrhoids                           D49.0, Neoplasm of unspecified behavior of                            digestive system                           K63.5, Polyp of colon                           R93.3, Abnormal findings on diagnostic imaging of                            other parts of digestive tract CPT copyright 2019 American Medical Association. All rights reserved. The codes documented in this report are preliminary and upon coder review may  be revised to meet current compliance requirements. Elon Alas. Abbey Chatters, DO [place] Abbey Chatters, DO 07/19/2021 9:10:37 AM This report has been signed electronically. Number of Addenda: 0

## 2021-07-19 NOTE — H&P (Signed)
Primary Care Physician:  Jani Gravel, MD Primary Gastroenterologist:  Dr. Abbey Chatters  Pre-Procedure History & Physical: HPI:  Michael [name] is a 66 y.o. male is here for a colonoscopy to be performed for ascending colon mass, heme positive stool, abnormal CT imaging.   Past Medical History:  Diagnosis Date   Carotid artery disease (HCC)    Chronic diastolic heart failure (HCC)    CKD (chronic kidney disease) stage 3, GFR 30-59 ml/min (HCC)    COPD (chronic obstructive pulmonary disease) (HCC)    Coronary artery disease    Dysrhythmia    Essential hypertension    Head and neck cancer ([place]) 2019   Right facial basal cell carcinoma s/p resection with right partial mastectomy and partal rhinectomy with skin graft (06/13/17)   Iron deficiency anemia    Urinary retention     Past Surgical History:  Procedure Laterality Date   ANKLE CLOSED REDUCTION Right    open reduction   BASAL CELL CARCINOMA EXCISION  2019   at [facility]   COLONOSCOPY N/A 05/31/2017   Procedure: COLONOSCOPY;  Surgeon: Daneil Dolin, MD;  Location: [facility];  Service: Endoscopy;  Laterality: N/A;  2:45pm   CYSTOSCOPY N/A 10/29/2018   Procedure: CYSTOSCOPY, CLOT EVACUATION;  Surgeon: Ceasar Mons, MD;  Location: [facility];  Service: Urology;  Laterality: N/A;   POLYPECTOMY  05/31/2017   Procedure: POLYPECTOMY;  Surgeon: Daneil Dolin, MD;  Location: [facility];  Service: Endoscopy;;   TONSILLECTOMY     TRANSCAROTID ARTERY REVASCULARIZATION  Right 02/15/2021   Procedure: RIGHT TRANSCAROTID ARTERY REVASCULARIZATION;  Surgeon: [name] Heck, MD;  Location: [place];  Service: Vascular;  Laterality: Right;   ULTRASOUND GUIDANCE FOR VASCULAR ACCESS Left 02/15/2021   Procedure: ULTRASOUND GUIDANCE FOR VASCULAR ACCESS;  Surgeon: [name] Heck, MD;  Location: [place];  Service: Vascular;  Laterality: Left;   XI ROBOTIC ASSISTED SIMPLE PROSTATECTOMY N/A 10/29/2018   Procedure: XI ROBOTIC ASSISTED SIMPLE  PROSTATECTOMY;  Surgeon: Cleon Gustin, MD;  Location: [facility];  Service: Urology;  Laterality: N/A;    Prior to Admission medications   Medication Sig Start Date End Date Taking? Authorizing Provider  aspirin EC 81 MG tablet Take 81 mg by mouth daily.   Yes [provider]  clopidogrel (PLAVIX) 75 MG tablet Take 1 tablet (75 mg total) by mouth daily. 05/19/21  Yes Satira Sark, MD  Evolocumab (REPATHA SURECLICK) 193 MG/ML SOAJ Inject 1 pen into the skin every 14 (fourteen) days. 03/09/21  Yes Satira Sark, MD  isosorbide mononitrate (IMDUR) 30 MG 24 hr tablet Take 1 tablet (30 mg total) by mouth every evening. 10/25/18  Yes Satira Sark, MD  metoprolol succinate (TOPROL-XL) 25 MG 24 hr tablet Take 1 tablet (25 mg total) by mouth every evening. 03/04/20  Yes Strader, [place] M, PA-C  trimethoprim (TRIMPEX) 100 MG tablet Take 1 tablet (100 mg total) by mouth at bedtime. 06/23/21  Yes McKenzie, Candee Furbish, MD  atorvastatin (LIPITOR) 80 MG tablet Take 1 tablet (80 mg total) by mouth every evening. Patient not taking: Reported on 07/09/2021 02/18/21   Dagoberto Ligas, PA-C  ergocalciferol (VITAMIN D2) 1.25 MG (50000 UT) capsule Take 1 capsule (50,000 Units total) by mouth once a week. Patient not taking: Reported on 07/09/2021 04/26/21   Harriett Rush, PA-C    Allergies as of 07/07/2021   (No Known Allergies)    Family History  Problem Relation Age of Onset  Stroke Father    Cirrhosis Mother    Colon cancer Neg Hx     Social History   Socioeconomic History   Marital status: Married    Spouse name: Not on file   Number of children: Not on file   Years of education: Not on file   Highest education level: Not on file  Occupational History   Not on file  Tobacco Use   Smoking status: Former    Packs/day: 0.50    Years: 40.00    Total pack years: 20.00    Types: Cigarettes    Quit date: 12/01/2019    Years since quitting: 1.6    Passive exposure:  Never   Smokeless tobacco: Never   Tobacco comments:    Quit on 06/09/21  Vaping Use   Vaping Use: Never used  Substance and Sexual Activity   Alcohol use: Not Currently   Drug use: Never   Sexual activity: Not Currently  Other Topics Concern   Not on file  Social History Narrative   Not on file   Social Determinants of Health   Financial Resource Strain: Low Risk  (12/11/2019)   Overall Financial Resource Strain (CARDIA)    Difficulty of Paying Living Expenses: Not hard at all  Food Insecurity: No Food Insecurity (12/11/2019)   Hunger Vital Sign    Worried About Running Out of Food in the Last Year: Never true    [place] in the Last Year: Never true  Transportation Needs: No Transportation Needs (12/11/2019)   PRAPARE - Hydrologist (Medical): No    Lack of Transportation (Non-Medical): No  Physical Activity: Inactive (12/11/2019)   Exercise Vital Sign    Days of Exercise per Week: 0 days    Minutes of Exercise per Session: 0 min  Stress: No Stress Concern Present (12/11/2019)   [place]    Feeling of Stress : Not at all  Social Connections: Socially Isolated (12/11/2019)   Social Connection and Isolation Panel [NHANES]    Frequency of Communication with Friends and Family: Never    Frequency of Social Gatherings with Friends and Family: Never    Attends Religious Services: Never    Marine scientist or Organizations: No    Attends Archivist Meetings: Never    Marital Status: Divorced  Human resources officer Violence: Not At Risk (12/11/2019)   Humiliation, Afraid, Rape, and Kick questionnaire    Fear of Current or Ex-Partner: No    Emotionally Abused: No    Physically Abused: No    Sexually Abused: No    Review of Systems: See HPI, otherwise negative ROS  Physical Exam: Vital signs in last 24 hours: Temp:  [97.7 F (36.5 C)] 97.7 F (36.5 C)  (06/19 0752) Pulse Rate:  [47-76] 76 (06/19 0800) Resp:  [14-15] 14 (06/19 0800) BP: (202-220)/(85-103) 202/85 (06/19 0800) SpO2:  [97 %-100 %] 100 % (06/19 0800)   General:   Alert,  Well-developed, well-nourished, pleasant and cooperative in NAD Head:  Normocephalic and atraumatic. Eyes:  Sclera clear, no icterus.   Conjunctiva pink. Ears:  Normal auditory acuity. Nose:  No deformity, discharge,  or lesions. Mouth:  No deformity or lesions, dentition normal. Neck:  Supple; no masses or thyromegaly. Lungs:  Clear throughout to auscultation.   No wheezes, crackles, or rhonchi. No acute distress. Heart:  Regular rate and rhythm; no murmurs, clicks, rubs,  or  gallops. Abdomen:  Soft, nontender and nondistended. No masses, hepatosplenomegaly or hernias noted. Normal bowel sounds, without guarding, and without rebound.   Msk:  Symmetrical without gross deformities. Normal posture. Extremities:  Without clubbing or edema. Neurologic:  Alert and  oriented x4;  grossly normal neurologically. Skin:  Intact without significant lesions or rashes. Cervical Nodes:  No significant cervical adenopathy. Psych:  Alert and cooperative. Normal mood and affect.  Impression/Plan: Michael [name] is here for a colonoscopy to be performed for ascending colon mass, heme positive stool, abnormal CT imaging.   The risks of the procedure including infection, bleed, or perforation as well as benefits, limitations, alternatives and imponderables have been reviewed with the patient. Questions have been answered. All parties agreeable.

## 2021-07-19 NOTE — Anesthesia Postprocedure Evaluation (Signed)
Anesthesia Post Note  Patient: Michael [name]  Procedure(s) Performed: COLONOSCOPY WITH PROPOFOL BIOPSY  Patient location during evaluation: Phase II Anesthesia Type: General Level of consciousness: awake and alert and oriented Pain management: pain level controlled Vital Signs Assessment: post-procedure vital signs reviewed and stable Respiratory status: spontaneous breathing, nonlabored ventilation and respiratory function stable Cardiovascular status: blood pressure returned to baseline and stable Postop Assessment: no apparent nausea or vomiting Anesthetic complications: no   No notable events documented.   Last Vitals:  Vitals:   07/19/21 0800 07/19/21 0907  BP: (!) 202/85 138/81  Pulse: 76 78  Resp: 14 18  Temp:  37 C  SpO2: 100% 95%    Last Pain:  Vitals:   07/19/21 0907  TempSrc: Oral  PainSc: 0-No pain                 Fontaine Hehl C Embree Brawley

## 2021-07-20 LAB — SURGICAL PATHOLOGY

## 2021-07-21 ENCOUNTER — Other Ambulatory Visit: Payer: Self-pay | Admitting: *Deleted

## 2021-07-21 DIAGNOSIS — D369 Benign neoplasm, unspecified site: Secondary | ICD-10-CM

## 2021-07-22 ENCOUNTER — Encounter (HOSPITAL_COMMUNITY): Payer: Self-pay | Admitting: Hematology

## 2021-07-23 ENCOUNTER — Encounter (HOSPITAL_COMMUNITY): Payer: Self-pay | Admitting: Internal Medicine

## 2021-07-28 ENCOUNTER — Ambulatory Visit (INDEPENDENT_AMBULATORY_CARE_PROVIDER_SITE_OTHER): Payer: Medicare HMO | Admitting: General Surgery

## 2021-07-28 ENCOUNTER — Encounter: Payer: Self-pay | Admitting: General Surgery

## 2021-07-28 VITALS — BP 215/109 | HR 74 | Temp 97.6°F | Resp 14 | Ht 72.0 in | Wt 164.0 lb

## 2021-07-28 DIAGNOSIS — K8689 Other specified diseases of pancreas: Secondary | ICD-10-CM

## 2021-07-28 DIAGNOSIS — K6389 Other specified diseases of intestine: Secondary | ICD-10-CM | POA: Diagnosis not present

## 2021-07-28 MED ORDER — METRONIDAZOLE 500 MG PO TABS: 1000.0000 mg | ORAL_TABLET | ORAL | 0 refills | Status: DC

## 2021-07-28 MED ORDER — NEOMYCIN SULFATE 500 MG PO TABS: 1000.0000 mg | ORAL_TABLET | ORAL | 0 refills | Status: DC

## 2021-07-28 NOTE — Patient Instructions (Addendum)
Getting MRI before the surgery to ensure we are not missing anything in the pancreas.  Plan for surgery 08/11/2021 and plan to be in the hospital about 5 days after surgery.   Colon Preparation:  Buy from the Store: Miralax bottle [phone]).  Gatorade 64 oz (not red). Dulcolax tablets.   The Day Prior to Surgery: Take 4 ducolax tablets at 7am with water. Drink plenty of clear liquids all day to avoid dehydration, no solid food.    Mix the bottle of Miralax and 64 oz of Gatorade and drink this mixture starting at 10am. Drink it gradually over the next few hours, 8 ounces every 15-30 minutes until it is gone. Finish this by 2pm.  Take 2 neomycin '500mg'$  tablets and 2 metronidazole '500mg'$  tablets at 2 pm. Take 2 neomycin '500mg'$  tablets and 2 metronidazole '500mg'$  tablets at 3pm. Take 2 neomycin '500mg'$  tablets and 2 metronidazole '500mg'$  tablets at 10pm.    Do not eat or drink anything after midnight the night before your surgery.  Do not eat or drink anything that morning, and take medications as instructed by the hospital staff on your preoperative visit.   Open Partial Colectomy An open partial colectomy is surgery to remove some of the large intestine (colon). This procedure may be used to treat several conditions, including: Tumors or masses in the colon. Inflammatory bowel disease, such as Crohn's disease or ulcerative colitis. Bleeding from the colon. Infection throughout the colon (diffuse infection) caused by Clostridioides difficile. This type of bacteria is also called C. difficile or C. diff. Tell a health care provider about: Any allergies you have. All medicines you are taking, including vitamins, herbs, eye drops, creams, and over-the-counter medicines. Any problems you or family members have had with anesthetic medicines. Any blood disorders you have. Any surgeries you have had. Any medical conditions you have. Whether you are pregnant or may be pregnant. Whether you use tobacco  products. These can affect your body's ability to recover and heal. What are the risks? Generally, this is a safe procedure. However, problems may occur, including: Infection. Bleeding. Allergic reactions to medicines. Damage to nearby structures or organs. The incision opening up. Tissues from inside your abdomen bulging through the incision (hernia). A blood clot forming in a vein and traveling to the lungs. What happens before the procedure? Medicines Ask your health care provider about: Changing or stopping your regular medicines or vitamins. This is especially important if you are taking diabetes medicines or blood thinners. Taking medicines such as aspirin and ibuprofen. These medicines can thin your blood. Do not take these medicines unless your health care provider tells you to take them. Taking over-the-counter medicines, vitamins, herbs, and supplements. Tests You may need tests, such as: Blood tests. Electrocardiogram (ECG). This is a test to check the heart's rhythm. CT scan of your abdomen. Urine tests. Colonoscopy. General instructions Do not use any products that contain nicotine or tobacco for at least 4 weeks before the procedure. These products include cigarettes, e-cigarettes, and chewing tobacco. If you need help quitting, ask your health care provider. You may be prescribed an oral bowel prep to clean out your colon. If so: Take it as told by your health care provider. Starting the day before your procedure, you may need to drink a large amount of liquid with medicine in it. The liquid will cause you to have bowel movements with multiple loose stools until your stool is almost clear or light green. Follow instructions from your health  care provider about eating and drinking restrictions during bowel prep. Plan to have someone take you home from the hospital or clinic. Arrange for someone to help you with your activities during your recovery. Bring loose-fitting,  comfortable clothing and slip-on shoes that you can put on without bending over. Ask your health care provider what steps will be taken to help prevent infection. These may include: Removing hair at the surgery site. Washing skin with a germ-killing soap. Taking antibiotic medicine. What happens during the procedure? An IV will be inserted into one of your veins. You will be given one or more of the following: A medicine to help you relax (sedative). A medicine to make you fall asleep (general anesthetic). Small monitors will be connected to your body. They will be used to check your heart, blood pressure, and oxygen level. A breathing tube may be placed into your lungs. A thin, flexible tube (catheter) will be placed into your bladder to drain urine. A tube may be inserted through your nose and into your stomach (nasogastric tube, or NG tube). The tube is used to remove stomach fluids after surgery until the intestines start working again. An incision will be made in your abdomen. The entire colon will be removed. The rectum will stay in place and will be closed with stitches (sutures) or staples. Then, you will have one of the following two procedures: Ileostomy. This brings the end of the small intestine, or ileum, up to the skin of your abdomen. A small opening, called a stoma, will be created in your lower abdomen. A removable, external pouch (ostomy pouch) will be attached to the stoma. This pouch will collect stool outside of your body. Stool will pass through the stoma and into the pouch instead of through the opening between the buttocks (anus). Ileorectal anastomosis.This connects the ileum to the rectum. The incision in your abdomen will be closed with sutures, staples, or adhesive strips. The incision will be covered with a bandage (dressing). The procedure may vary among health care providers and hospitals. What happens after the procedure? Your blood pressure, heart rate,  breathing rate, and blood oxygen level will be monitored until you leave the hospital. You may continue to receive fluids and medicines through an IV. You will start on a clear liquid diet and gradually go back to a normal diet. Do not drive until your health care provider approves. You may have some pain in your abdomen. You will be given medicine to control the pain. You will be encouraged to: Do breathing exercises to prevent pneumonia. Get up and start walking within a day after surgery. You should try to get up 5-6 times a day. Summary An open total colectomy is surgery to remove all of the large intestine (colon). Follow instructions from your health care provider about eating and drinking before your procedure. You may be prescribed an oral bowel prep to clean out your colon before your procedure. If so, follow your health care provider's instructions for taking it. Arrange for someone to help you with your activities during your recovery. This information is not intended to replace advice given to you by your health care provider. Make sure you discuss any questions you have with your health care provider. Document Revised: 12/26/2018 Document Reviewed: 12/26/2018 Elsevier Patient Education  [place].

## 2021-07-28 NOTE — Progress Notes (Signed)
[facility] History and Physical  Reason for Referral:*** Referring Physician: ***  Chief Complaint   Follow-up     Michael [name] is a 66 y.o. male.  HPI: ***.  The *** started *** and has had a duration of ***.  It is associated with ***.  The *** is improved with ***, and is made worse with ***.    Quality*** Context***  Past Medical History:  Diagnosis Date  . Carotid artery disease ([place])   . Chronic diastolic heart failure ([place])   . CKD (chronic kidney disease) stage 3, GFR 30-59 ml/min (HCC)   . COPD (chronic obstructive pulmonary disease) ([place])   . Coronary artery disease   . Dysrhythmia   . Essential hypertension   . Head and neck cancer ([place]) 2019   Right facial basal cell carcinoma s/p resection with right partial mastectomy and partal rhinectomy with skin graft (06/13/17)  . Iron deficiency anemia   . Urinary retention     Past Surgical History:  Procedure Laterality Date  . ANKLE CLOSED REDUCTION Right    open reduction  . BASAL CELL CARCINOMA EXCISION  2019   at [facility]  . BIOPSY  07/19/2021   Procedure: BIOPSY;  Surgeon: Eloise Harman, DO;  [facility];  Service: Endoscopy;;  . COLONOSCOPY N/A 05/31/2017   Procedure: COLONOSCOPY;  Surgeon: Daneil Dolin, MD;  [facility];  Service: Endoscopy;  Laterality: N/A;  2:45pm  . COLONOSCOPY WITH PROPOFOL N/A 07/19/2021   Procedure: COLONOSCOPY WITH PROPOFOL;  Surgeon: Eloise Harman, DO;  [facility];  Service: Endoscopy;  Laterality: N/A;  3:00pm, moved up to 9:00  . CYSTOSCOPY N/A 10/29/2018   Procedure: Jilda Panda;  Surgeon: Ceasar Mons, MD;  Location: [facility];  Service: Urology;  Laterality: N/A;  . POLYPECTOMY  05/31/2017   Procedure: POLYPECTOMY;  Surgeon: Daneil Dolin, MD;  [facility];  Service: Endoscopy;;  . TONSILLECTOMY    . TRANSCAROTID ARTERY REVASCULARIZATION  Right 02/15/2021   Procedure: RIGHT  TRANSCAROTID ARTERY REVASCULARIZATION;  Surgeon: [name] Heck, MD;  Location: [facility];  Service: Vascular;  Laterality: Right;  . ULTRASOUND GUIDANCE FOR VASCULAR ACCESS Left 02/15/2021   Procedure: ULTRASOUND GUIDANCE FOR VASCULAR ACCESS;  Surgeon: [name] Heck, MD;  Location: [facility];  Service: Vascular;  Laterality: Left;  . XI ROBOTIC ASSISTED SIMPLE PROSTATECTOMY N/A 10/29/2018   Procedure: XI ROBOTIC ASSISTED SIMPLE PROSTATECTOMY;  Surgeon: Cleon Gustin, MD;  [facility];  Service: Urology;  Laterality: N/A;    Family History  Problem Relation Age of Onset  . Stroke Father   . Cirrhosis Mother   . Colon cancer Neg Hx     Social History   Tobacco Use  . Smoking status: Former    Packs/day: 0.50    Years: 40.00    Total pack years: 20.00    Types: Cigarettes    Quit date: 07/02/2021    Years since quitting: 0.0    Passive exposure: Never  . Smokeless tobacco: Never  . Tobacco comments:    Quit on 06/09/21  Vaping Use  . Vaping Use: Never used  Substance Use Topics  . Alcohol use: Not Currently  . Drug use: Never    Medications: {medication reviewed/display:3041432} Allergies as of 07/28/2021   No Known Allergies      Medication List        Accurate as of July 28, 2021  2:14 PM.  If you have any questions, ask your nurse or doctor.          aspirin EC 81 MG tablet Take 81 mg by mouth daily.   atorvastatin 80 MG tablet Commonly known as: LIPITOR Take 1 tablet (80 mg total) by mouth every evening.   clopidogrel 75 MG tablet Commonly known as: PLAVIX Take 1 tablet (75 mg total) by mouth daily.   ergocalciferol 1.25 MG (50000 UT) capsule Commonly known as: VITAMIN D2 Take 1 capsule (50,000 Units total) by mouth once a week.   isosorbide mononitrate 30 MG 24 hr tablet Commonly known as: IMDUR Take 1 tablet (30 mg total) by mouth every evening.   metoprolol succinate 25 MG 24 hr tablet Commonly known as: TOPROL-XL Take 1 tablet (25  mg total) by mouth every evening.   Repatha SureClick 465 MG/ML [place] Generic drug: Evolocumab Inject 1 pen into the skin every 14 (fourteen) days.   trimethoprim 100 MG tablet Commonly known as: TRIMPEX Take 1 tablet (100 mg total) by mouth at bedtime.         ROS:  {Review of Systems:30496}  Blood pressure (!) 215/109, pulse 74, temperature 97.6 F (36.4 C), temperature source Oral, resp. rate 14, height 6' (1.829 m), weight 164 lb (74.4 kg), SpO2 98 %. Physical Exam  Results: No results found for this or any previous visit (from the past 48 hour(s)).  No results found.   Assessment & Plan:  Michael [name] is a 66 y.o. male with *** -*** -*** -Follow up ***  All questions were answered to the satisfaction of the patient and family***.  The risk and benefits of *** were discussed including but not limited to ***.  After careful consideration, Michael [name] has decided to ***.    Michael [name] 07/28/2021, 2:14 PM

## 2021-07-29 ENCOUNTER — Ambulatory Visit (HOSPITAL_COMMUNITY): Payer: Medicare HMO

## 2021-07-31 ENCOUNTER — Encounter (HOSPITAL_COMMUNITY): Payer: Self-pay | Admitting: Hematology

## 2021-08-02 ENCOUNTER — Encounter (HOSPITAL_COMMUNITY): Payer: Self-pay | Admitting: Hematology

## 2021-08-03 ENCOUNTER — Encounter: Payer: Self-pay | Admitting: Physician Assistant

## 2021-08-03 DIAGNOSIS — N183 Chronic kidney disease, stage 3 unspecified: Secondary | ICD-10-CM | POA: Insufficient documentation

## 2021-08-03 DIAGNOSIS — Z01810 Encounter for preprocedural cardiovascular examination: Secondary | ICD-10-CM | POA: Insufficient documentation

## 2021-08-03 DIAGNOSIS — I700 Atherosclerosis of aorta: Secondary | ICD-10-CM

## 2021-08-03 DIAGNOSIS — I2510 Atherosclerotic heart disease of native coronary artery without angina pectoris: Secondary | ICD-10-CM

## 2021-08-03 DIAGNOSIS — N1832 Chronic kidney disease, stage 3b: Secondary | ICD-10-CM | POA: Insufficient documentation

## 2021-08-03 DIAGNOSIS — N1831 Chronic kidney disease, stage 3a: Secondary | ICD-10-CM | POA: Insufficient documentation

## 2021-08-03 HISTORY — DX: Atherosclerotic heart disease of native coronary artery without angina pectoris: I25.10

## 2021-08-03 HISTORY — DX: Atherosclerosis of aorta: I70.0

## 2021-08-03 NOTE — Progress Notes (Deleted)
Cardiology Office Note:    Date:  08/03/2021   ID:  Michael [name], DOB 04-19-1955, MRN [id number]  PCP:  Jani Gravel, [place] Prov[facility] Cardiologist:  Rozann Lesches, MD { Click to update primary MD,subspecialty MD or APP then REFRESH:1}  *** Referring MD: Jani Gravel, MD   Chief Complaint:  No chief complaint on file. {Click here for Visit Info    :1}  .hcsurg Patient Profile: Coronary artery disease Med Rx (abnl Myoview in 2019 - no symptoms) HFmrEF (heart failure with mildly reduced ejection fraction)  Echocardiogram 2019:  EF 45-50 Ischemic CM - Myoview in 2019: +scar and mild ischemia >> med mgmt Carotid artery disease S/p R TCAR in 01/2021 Chronic kidney disease, stage III Chronic Obstructive Pulmonary Disease Hypertension Hyperlipidemia Rx: Lipitor, Repatha Head and neck CA Iron deficiency anemia Aortic atherosclerosis  Prior CV Studies: {Select studies to display:26339}  Carotid US 03/23/2021 Bilateral ICA 1-39  Echocardiogram 12/05/2017 Mild LVH, EF 45-50, inferolateral HK, GRII DD, mild MR, mild LAE, trivial TR, trivial pericardial effusion  Nuclear stress test 10/19/2017 Inferior/septal myocardial infarction with mild peri-infarct ischemia, EF 33  Echocardiogram 07/06/2017 EF 55-60, normal wall motion  History of Present Illness:   Michael [name] is a 66 y.o. male with the above problem list.  He was last seen by Dr. Domenic Polite in December 2022.  He underwent right TCAR for carotid artery stenosis in January 2023.  He returns for surgical clearance.   He has a colonic mass that requires resection with Dr. Curlene Labrum.***    Past Medical History:  Diagnosis Date   CAD (coronary artery disease) 08/03/2021   Nuclear stress test 10/19/2017: Inferior/septal myocardial infarction with mild peri-infarct ischemia, EF 33 Med Rx - no symptoms   Carotid artery disease (HCC)    Chronic diastolic heart failure (HCC)    CKD (chronic kidney disease) stage 3,  GFR 30-59 ml/min (HCC)    COPD (chronic obstructive pulmonary disease) (HCC)    Coronary artery disease    Dysrhythmia    Essential hypertension    Head and neck cancer ([place]) 2019   Right facial basal cell carcinoma s/p resection with right partial mastectomy and partal rhinectomy with skin graft (06/13/17)   Iron deficiency anemia    Urinary retention    Current Medications: No outpatient medications have been marked as taking for the 08/04/21 encounter (Appointment) with Richardson Dopp T, PA-C.    Allergies:   Patient has no known allergies.   Social History   Tobacco Use   Smoking status: Former    Packs/day: 0.50    Years: 40.00    Total pack years: 20.00    Types: Cigarettes    Quit date: 07/02/2021    Years since quitting: 0.0    Passive exposure: Never   Smokeless tobacco: Never   Tobacco comments:    Quit on 06/09/21  Vaping Use   Vaping Use: Never used  Substance Use Topics   Alcohol use: Not Currently   Drug use: Never    Family Hx: The patient's family history includes Cirrhosis in his mother; Stroke in his father. There is no history of Colon cancer.  ROS   EKGs/Labs/Other Test Reviewed:    EKG:  EKG is *** ordered today.  The ekg ordered today demonstrates ***  Recent Labs: 10/12/2020: Magnesium 2.0 02/09/2021: ALT 27 07/16/2021: BUN 23; Creatinine, Ser 1.54; Hemoglobin 11.0; Platelets 337; Potassium 3.2; Sodium 140   Recent Lipid Panel Recent Labs  05/21/21 0919  CHOL 178  TRIG 87  HDL 55  VLDL 17  LDLCALC 106*     Risk Assessment/Calculations/Metrics:   {Does this patient have ATRIAL FIBRILLATION?[phone]}     No BP recorded.  {Refresh Note OR Click here to enter BP  :1}***    Physical Exam:    VS:  There were no vitals taken for this visit.    Wt Readings from Last 3 Encounters:  07/28/21 164 lb (74.4 kg)  07/16/21 162 lb 11.2 oz (73.8 kg)  06/16/21 162 lb 9.6 oz (73.8 kg)    Physical Exam ***     ASSESSMENT & PLAN:   No  problem-specific Assessment & Plan notes found for this encounter.        {Are you ordering a CV Procedure (e.g. stress test, cath, DCCV, TEE, etc)?   Press F2        :[id number]}   Dispo:  No follow-ups on file.   Medication Adjustments/Labs and Tests Ordered: Current medicines are reviewed at length with the patient today.  Concerns regarding medicines are outlined above.  Tests Ordered: No orders of the defined types were placed in this encounter.  Medication Changes: No orders of the defined types were placed in this encounter.  Signed, Richardson Dopp, PA-C  08/03/2021 2:40 PM    [place], [place],   [address] Phone: [phone]; Fax: [phone]

## 2021-08-04 ENCOUNTER — Ambulatory Visit: Payer: Medicare HMO | Admitting: Physician Assistant

## 2021-08-04 ENCOUNTER — Encounter (HOSPITAL_COMMUNITY): Payer: Self-pay | Admitting: Hematology

## 2021-08-04 DIAGNOSIS — I5022 Chronic systolic (congestive) heart failure: Secondary | ICD-10-CM

## 2021-08-04 DIAGNOSIS — I700 Atherosclerosis of aorta: Secondary | ICD-10-CM

## 2021-08-04 DIAGNOSIS — I2510 Atherosclerotic heart disease of native coronary artery without angina pectoris: Secondary | ICD-10-CM

## 2021-08-04 DIAGNOSIS — I6523 Occlusion and stenosis of bilateral carotid arteries: Secondary | ICD-10-CM

## 2021-08-04 DIAGNOSIS — Z01810 Encounter for preprocedural cardiovascular examination: Secondary | ICD-10-CM

## 2021-08-04 DIAGNOSIS — I10 Essential (primary) hypertension: Secondary | ICD-10-CM

## 2021-08-04 DIAGNOSIS — N1831 Chronic kidney disease, stage 3a: Secondary | ICD-10-CM

## 2021-08-04 NOTE — Patient Instructions (Signed)
Idus Rathke  08/04/2021     '@PREFPERIOPPHARMACY'$ @   Your procedure is scheduled on  08/11/2021.   Report to [facility] at  1020 A.M.   Call this number if you have problems the morning of surgery:  [phone]   Remember:    The day prior to surgery take 4 dulcolax tablets at 0700 am with water.Drink plenty of clear liquids all day to avoid dehydration. NO SOLID FOOD.    Mix the bottle of miralax and 64 oz of gatorade and drink this mixture starting at 1000 am. Drink this mixture gradually over the next few hours, 8 ounces every 15-20 minutes until it is gone. Make sure this is completed by 2 pm.    Take 2 neomycin 500 mg tablets and 2 metronidazole 500 mg tablets at 2pm.  Repeat these meds at 3 pm and 1000 pm.    At 1000 pm, drink 2 carb drinks.    Drink the 3rd carb drink at 0820 am on 08/11/2021.    You may have clear liquids until 0820 on 08/11/2021 at 0820 am.    You can have nothing else to drink after this.     Clear liquids allowed are:                    Water, Juice (non-citric and without pulp - diabetics please choose diet or no sugar options), Carbonated beverages - (diabetics please choose diet or no sugar options), Clear Tea, Black Coffee only (no creamer, milk or cream including half and half), Plain Jell-O only (diabetics please choose diet or no sugar options), Gatorade (diabetics please choose diet or no sugar options), and Plain Popsicles only     Take these medicines the morning of surgery with A SIP OF WATER                             imdur, metoprolol.    Stop your plavix as directed by surgeon and cardiologist.     Do not wear jewelry, make-up or nail polish.  Do not wear lotions, powders, or perfumes, or deodorant.  Do not shave 48 hours prior to surgery.  Men may shave face and neck.  Do not bring valuables to the hospital.  [facility] is not responsible for any belongings or valuables.  Contacts, dentures or bridgework may not  be worn into surgery.  Leave your suitcase in the car.  After surgery it may be brought to your room.  For patients admitted to the hospital, discharge time will be determined by your treatment team.  Patients discharged the day of surgery will not be allowed to drive home.    Special instructions:   DO NOT smoke tobacco or vape for 24 hours before your procedure.  Please read over the following fact sheets that you were given. Pain Booklet, Coughing and Deep Breathing, Blood Transfusion Information, Surgical Site Infection Prevention, Anesthesia Post-op Instructions, and Care and Recovery After Surgery      Open Total Colectomy, Care After This sheet gives you information about how to care for yourself after your procedure. Your health care provider may also give you more specific instructions. If you have problems or questions, contact your health care provider. What can I expect after the procedure? After the procedure, it is common to have: Pain in your abdomen, especially along your incision. Tiredness. Your energy level will return  to normal over the next several weeks. Constipation. Nausea. Difficulty urinating. Follow these instructions at home: Medicines Take over-the-counter and prescription medicines, including stool softeners, only as told by your health care provider. Ask your health care provider if the medicine prescribed to you requires you to avoid driving or using machinery. Eating and drinking Follow instructions from your health care provider about eating or drinking restrictions. Eat a low-fiber diet for the first 4 weeks after surgery. Most people on a low-fiber eating plan should eat less than 10 grams (g) of fiber a day. Follow recommendations from your health care provider or dietitian about how much fiber you should have each day. Always check food labels to know the fiber content of packaged foods. In general, a low-fiber food will have fewer than 2 g of  fiber a serving. In general, try to avoid whole grains, raw fruits and vegetables, dried fruit, tough cuts of meat, nuts, and seeds. Incision care  Follow instructions from your health care provider about how to take care of your incision. Make sure you: Wash your hands with soap and water for at least 20 seconds before and after you change your bandage (dressing). If soap and water are not available, use hand sanitizer. Change your dressing as told by your health care provider. Leave stitches (sutures), staples, or adhesive strips in place. These skin closures may need to stay in place for 2 weeks or longer. If adhesive strip edges start to loosen and curl up, you may trim the loose edges. Do not remove adhesive strips completely unless your health care provider tells you to do that. Avoid wearing tight clothing around your incision. Protect your incision area from the sun. Do not take baths, swim, or use a hot tub until your health care provider approves. Ask your health care provider if you may take showers. You may only be allowed to take sponge baths. Check your incision area every day for signs of infection. Check for: More redness, swelling, or pain. Fluid or blood. Warmth. Pus or a bad smell. Managing constipation Your procedure may cause constipation. To prevent or treat constipation, you may need to: Drink enough fluid to keep your urine pale yellow. Take over-the-counter or prescription medicines. Activity  Rest as told by your health care provider. Avoid sitting for a long time without moving. Get up to take short walks every 1-2 hours. This is important to improve blood flow and breathing. Ask for help if you feel weak or unsteady. Avoid activities that require a lot of energy for 4-6 weeks after surgery, such as running, climbing, and lifting heavy objects. You may need to continue to do breathing exercises. Do not lift anything that is heavier than 10 lb (4.5 kg), or the  limit that you are told, until your health care provider says that it is safe. Do not drive until you are no longer taking prescription pain medicines and you feel you are able to drive safely. This might take up to 4 weeks. Return to your normal activities as told by your health care provider. Ask your health care provider what activities are safe for you. General instructions If you have an opening (stoma) in your abdomen, follow instructions from your health care provider about how to care for the stoma and the external pouch attached to it (ostomy pouch). Do not use any products that contain nicotine or tobacco, such as cigarettes, e-cigarettes, and chewing tobacco. These can delay incision healing after surgery. If you need  help quitting, ask your health care provider. Keep all follow-up visits as told by your health care provider. This is important. Contact a health care provider if: You have any of these signs of infection: More redness, swelling, or pain around your incision. Fluid or blood coming from your incision. Warmth coming from your incision. Pus or a bad smell coming from your incision. A fever or chills. You do not have a bowel movement within 2-3 days after surgery. You cannot eat or drink for 24 hours or longer. You have nausea and vomiting that will not go away. You have pain in your abdomen that gets worse and does not get better with medicine. Get help right away if: You have chest pain. You have shortness of breath. You have pain or swelling in your legs. Your incision breaks open after your sutures, staples, or adhesive strips have been removed. You have bleeding from the rectum. These symptoms may represent a serious problem that is an emergency. Do not wait to see if the symptoms will go away. Get medical help right away. Call your local emergency services (911 in the [place] Do not drive yourself to the hospital. Summary After your procedure, it is common to have  pain, tiredness, constipation, nausea, and difficulty urinating. If you have an opening (stoma) in your abdomen, follow instructions from your health care provider about how to care for the stoma and the external pouch attached to it (ostomy pouch). Follow instructions from your health care provider about eating and drinking and about how to take care of your incision. Keep all follow-up visits as told by your health care provider. This is important. This information is not intended to replace advice given to you by your health care provider. Make sure you discuss any questions you have with your health care provider. Document Revised: 12/26/2018 Document Reviewed: 12/26/2018 Elsevier Patient Education  [place] Anesthesia, Adult, Care After This sheet gives you information about how to care for yourself after your procedure. Your health care provider may also give you more specific instructions. If you have problems or questions, contact your health care provider. What can I expect after the procedure? After the procedure, the following side effects are common: Pain or discomfort at the IV site. Nausea. Vomiting. Sore throat. Trouble concentrating. Feeling cold or chills. Feeling weak or tired. Sleepiness and fatigue. Soreness and body aches. These side effects can affect parts of the body that were not involved in surgery. Follow these instructions at home: For the time period you were told by your health care provider:  Rest. Do not participate in activities where you could fall or become injured. Do not drive or use machinery. Do not drink alcohol. Do not take sleeping pills or medicines that cause drowsiness. Do not make important decisions or sign legal documents. Do not take care of children on your own. Eating and drinking Follow any instructions from your health care provider about eating or drinking restrictions. When you feel hungry, start by eating small  amounts of foods that are soft and easy to digest (bland), such as toast. Gradually return to your regular diet. Drink enough fluid to keep your urine pale yellow. If you vomit, rehydrate by drinking water, juice, or clear broth. General instructions If you have sleep apnea, surgery and certain medicines can increase your risk for breathing problems. Follow instructions from your health care provider about wearing your sleep device: Anytime you are sleeping, including during daytime naps. While taking  prescription pain medicines, sleeping medicines, or medicines that make you drowsy. Have a responsible adult stay with you for the time you are told. It is important to have someone help care for you until you are awake and alert. Return to your normal activities as told by your health care provider. Ask your health care provider what activities are safe for you. Take over-the-counter and prescription medicines only as told by your health care provider. If you smoke, do not smoke without supervision. Keep all follow-up visits as told by your health care provider. This is important. Contact a health care provider if: You have nausea or vomiting that does not get better with medicine. You cannot eat or drink without vomiting. You have pain that does not get better with medicine. You are unable to pass urine. You develop a skin rash. You have a fever. You have redness around your IV site that gets worse. Get help right away if: You have difficulty breathing. You have chest pain. You have blood in your urine or stool, or you vomit blood. Summary After the procedure, it is common to have a sore throat or nausea. It is also common to feel tired. Have a responsible adult stay with you for the time you are told. It is important to have someone help care for you until you are awake and alert. When you feel hungry, start by eating small amounts of foods that are soft and easy to digest (bland), such as  toast. Gradually return to your regular diet. Drink enough fluid to keep your urine pale yellow. Return to your normal activities as told by your health care provider. Ask your health care provider what activities are safe for you. This information is not intended to replace advice given to you by your health care provider. Make sure you discuss any questions you have with your health care provider. Document Revised: 10/03/2019 Document Reviewed: 05/02/2019 Elsevier Patient Education  [place]. How to Use Chlorhexidine for Bathing Chlorhexidine gluconate (CHG) is a germ-killing (antiseptic) solution that is used to clean the skin. It can get rid of the bacteria that normally live on the skin and can keep them away for about 24 hours. To clean your skin with CHG, you may be given: A CHG solution to use in the shower or as part of a sponge bath. A prepackaged cloth that contains CHG. Cleaning your skin with CHG may help lower the risk for infection: While you are staying in the intensive care unit of the hospital. If you have a vascular access, such as a central line, to provide short-term or long-term access to your veins. If you have a catheter to drain urine from your bladder. If you are on a ventilator. A ventilator is a machine that helps you breathe by moving air in and out of your lungs. After surgery. What are the risks? Risks of using CHG include: A skin reaction. Hearing loss, if CHG gets in your ears and you have a perforated eardrum. Eye injury, if CHG gets in your eyes and is not rinsed out. The CHG product catching fire. Make sure that you avoid smoking and flames after applying CHG to your skin. Do not use CHG: If you have a chlorhexidine allergy or have previously reacted to chlorhexidine. On babies younger than 80 months of age. How to use CHG solution Use CHG only as told by your health care provider, and follow the instructions on the label. Use the full amount  of CHG as directed. Usually, this is one bottle. During a shower Follow these steps when using CHG solution during a shower (unless your health care provider gives you different instructions): Start the shower. Use your normal soap and shampoo to wash your face and hair. Turn off the shower or move out of the shower stream. Pour the CHG onto a clean washcloth. Do not use any type of brush or rough-edged sponge. Starting at your neck, lather your body down to your toes. Make sure you follow these instructions: If you will be having surgery, pay special attention to the part of your body where you will be having surgery. Scrub this area for at least 1 minute. Do not use CHG on your head or face. If the solution gets into your ears or eyes, rinse them well with water. Avoid your genital area. Avoid any areas of skin that have broken skin, cuts, or scrapes. Scrub your back and under your arms. Make sure to wash skin folds. Let the lather sit on your skin for 1-2 minutes or as long as told by your health care provider. Thoroughly rinse your entire body in the shower. Make sure that all body creases and crevices are rinsed well. Dry off with a clean towel. Do not put any substances on your body afterward--such as powder, lotion, or perfume--unless you are told to do so by your health care provider. Only use lotions that are recommended by the manufacturer. Put on clean clothes or pajamas. If it is the night before your surgery, sleep in clean sheets.  During a sponge bath Follow these steps when using CHG solution during a sponge bath (unless your health care provider gives you different instructions): Use your normal soap and shampoo to wash your face and hair. Pour the CHG onto a clean washcloth. Starting at your neck, lather your body down to your toes. Make sure you follow these instructions: If you will be having surgery, pay special attention to the part of your body where you will be having  surgery. Scrub this area for at least 1 minute. Do not use CHG on your head or face. If the solution gets into your ears or eyes, rinse them well with water. Avoid your genital area. Avoid any areas of skin that have broken skin, cuts, or scrapes. Scrub your back and under your arms. Make sure to wash skin folds. Let the lather sit on your skin for 1-2 minutes or as long as told by your health care provider. Using a different clean, wet washcloth, thoroughly rinse your entire body. Make sure that all body creases and crevices are rinsed well. Dry off with a clean towel. Do not put any substances on your body afterward--such as powder, lotion, or perfume--unless you are told to do so by your health care provider. Only use lotions that are recommended by the manufacturer. Put on clean clothes or pajamas. If it is the night before your surgery, sleep in clean sheets. How to use CHG prepackaged cloths Only use CHG cloths as told by your health care provider, and follow the instructions on the label. Use the CHG cloth on clean, dry skin. Do not use the CHG cloth on your head or face unless your health care provider tells you to. When washing with the CHG cloth: Avoid your genital area. Avoid any areas of skin that have broken skin, cuts, or scrapes. Before surgery Follow these steps when using a CHG cloth to clean before surgery (unless  your health care provider gives you different instructions): Using the CHG cloth, vigorously scrub the part of your body where you will be having surgery. Scrub using a back-and-forth motion for 3 minutes. The area on your body should be completely wet with CHG when you are done scrubbing. Do not rinse. Discard the cloth and let the area air-dry. Do not put any substances on the area afterward, such as powder, lotion, or perfume. Put on clean clothes or pajamas. If it is the night before your surgery, sleep in clean sheets.  For general bathing Follow these steps  when using CHG cloths for general bathing (unless your health care provider gives you different instructions). Use a separate CHG cloth for each area of your body. Make sure you wash between any folds of skin and between your fingers and toes. Wash your body in the following order, switching to a new cloth after each step: The front of your neck, shoulders, and chest. Both of your arms, under your arms, and your hands. Your stomach and groin area, avoiding the genitals. Your right leg and foot. Your left leg and foot. The back of your neck, your back, and your buttocks. Do not rinse. Discard the cloth and let the area air-dry. Do not put any substances on your body afterward--such as powder, lotion, or perfume--unless you are told to do so by your health care provider. Only use lotions that are recommended by the manufacturer. Put on clean clothes or pajamas. Contact a health care provider if: Your skin gets irritated after scrubbing. You have questions about using your solution or cloth. You swallow any chlorhexidine. Call your local poison control center (1-(573)457-8321 in the [place] Get help right away if: Your eyes itch badly, or they become very red or swollen. Your skin itches badly and is red or swollen. Your hearing changes. You have trouble seeing. You have swelling or tingling in your mouth or throat. You have trouble breathing. These symptoms may represent a serious problem that is an emergency. Do not wait to see if the symptoms will go away. Get medical help right away. Call your local emergency services (911 in the [place] Do not drive yourself to the hospital. Summary Chlorhexidine gluconate (CHG) is a germ-killing (antiseptic) solution that is used to clean the skin. Cleaning your skin with CHG may help to lower your risk for infection. You may be given CHG to use for bathing. It may be in a bottle or in a prepackaged cloth to use on your skin. Carefully follow your health care  provider's instructions and the instructions on the product label. Do not use CHG if you have a chlorhexidine allergy. Contact your health care provider if your skin gets irritated after scrubbing. This information is not intended to replace advice given to you by your health care provider. Make sure you discuss any questions you have with your health care provider. Document Revised: 03/30/2020 Document Reviewed: 03/30/2020 Elsevier Patient Education  [place].

## 2021-08-05 ENCOUNTER — Telehealth: Payer: Self-pay | Admitting: Gastroenterology

## 2021-08-05 ENCOUNTER — Ambulatory Visit (HOSPITAL_COMMUNITY): Admission: RE | Admit: 2021-08-05 | Payer: Medicare Other | Source: Ambulatory Visit

## 2021-08-05 NOTE — Telephone Encounter (Signed)
Please cancel upcoming appt with me on 7/12 as patient is scheduled for colon surgery that day. We will make arrangements for future follow up based on pending MRI etc.

## 2021-08-06 ENCOUNTER — Ambulatory Visit (HOSPITAL_COMMUNITY)
Admission: RE | Admit: 2021-08-06 | Discharge: 2021-08-06 | Disposition: A | Discharge status: Home / Self Care | Payer: Medicare Other | Source: Ambulatory Visit | Attending: General Surgery | Admitting: General Surgery

## 2021-08-06 ENCOUNTER — Other Ambulatory Visit: Payer: Self-pay | Admitting: General Surgery

## 2021-08-06 ENCOUNTER — Encounter (HOSPITAL_COMMUNITY)
Admission: RE | Admit: 2021-08-06 | Discharge: 2021-08-06 | Disposition: A | Discharge status: Home / Self Care | Payer: Medicare Other | Source: Ambulatory Visit | Attending: General Surgery | Admitting: General Surgery

## 2021-08-06 DIAGNOSIS — K6389 Other specified diseases of intestine: Secondary | ICD-10-CM | POA: Diagnosis present

## 2021-08-06 DIAGNOSIS — K8689 Other specified diseases of pancreas: Secondary | ICD-10-CM | POA: Diagnosis present

## 2021-08-06 DIAGNOSIS — Z79899 Other long term (current) drug therapy: Secondary | ICD-10-CM | POA: Diagnosis present

## 2021-08-06 HISTORY — DX: Other specified diseases of intestine: K63.89

## 2021-08-06 MED ORDER — GADOBUTROL 1 MMOL/ML IV SOLN
7.5000 mL | Freq: Once | INTRAVENOUS | Status: AC | PRN
  Administered 2021-08-06: 7.5 mL via INTRAVENOUS

## 2021-08-06 NOTE — Pre-Procedure Instructions (Signed)
Patient did not show for his pre-op today. Spoke with Vida Roller at Dr Constance Haw office to make her aware. I attempted to call patient and the line rang and did not go to voicemail.

## 2021-08-09 ENCOUNTER — Telehealth: Payer: Self-pay | Admitting: Cardiology

## 2021-08-09 ENCOUNTER — Telehealth: Payer: Self-pay | Admitting: *Deleted

## 2021-08-09 ENCOUNTER — Encounter (HOSPITAL_COMMUNITY)
Admission: RE | Admit: 2021-08-09 | Discharge: 2021-08-09 | Disposition: A | Discharge status: Home / Self Care | Payer: Medicare Other | Source: Ambulatory Visit | Attending: General Surgery | Admitting: General Surgery

## 2021-08-09 DIAGNOSIS — Z79899 Other long term (current) drug therapy: Secondary | ICD-10-CM

## 2021-08-09 DIAGNOSIS — Z01812 Encounter for preprocedural laboratory examination: Secondary | ICD-10-CM | POA: Insufficient documentation

## 2021-08-09 DIAGNOSIS — K6389 Other specified diseases of intestine: Secondary | ICD-10-CM

## 2021-08-09 DIAGNOSIS — Z01810 Encounter for preprocedural cardiovascular examination: Secondary | ICD-10-CM

## 2021-08-09 LAB — CBC WITH DIFFERENTIAL/PLATELET
Abs Immature Granulocytes: 0.02 10*3/uL (ref 0.00–0.07)
Basophils Absolute: 0.1 10*3/uL (ref 0.0–0.1)
Basophils Relative: 1 %
Eosinophils Absolute: 0.3 10*3/uL (ref 0.0–0.5)
Eosinophils Relative: 3 %
HCT: 41.1 % (ref 39.0–52.0)
Hemoglobin: 12.2 g/dL — ABNORMAL LOW (ref 13.0–17.0)
Immature Granulocytes: 0 %
Lymphocytes Relative: 23 %
Lymphs Abs: 1.8 10*3/uL (ref 0.7–4.0)
MCH: 24.4 pg — ABNORMAL LOW (ref 26.0–34.0)
MCHC: 29.7 g/dL — ABNORMAL LOW (ref 30.0–36.0)
MCV: 82.2 fL (ref 80.0–100.0)
Monocytes Absolute: 0.7 10*3/uL (ref 0.1–1.0)
Monocytes Relative: 9 %
Neutro Abs: 4.9 10*3/uL (ref 1.7–7.7)
Neutrophils Relative %: 64 %
Platelets: 418 10*3/uL — ABNORMAL HIGH (ref 150–400)
RBC: 5 MIL/uL (ref 4.22–5.81)
RDW: 14.2 % (ref 11.5–15.5)
WBC: 7.8 10*3/uL (ref 4.0–10.5)
nRBC: 0 % (ref 0.0–0.2)

## 2021-08-09 LAB — BASIC METABOLIC PANEL
Anion gap: 5 (ref 5–15)
BUN: 20 mg/dL (ref 8–23)
CO2: 26 mmol/L (ref 22–32)
Calcium: 8.8 mg/dL — ABNORMAL LOW (ref 8.9–10.3)
Chloride: 109 mmol/L (ref 98–111)
Creatinine, Ser: 1.65 mg/dL — ABNORMAL HIGH (ref 0.61–1.24)
GFR, Estimated: 45 mL/min — ABNORMAL LOW (ref >60)
Glucose, Bld: 105 mg/dL — ABNORMAL HIGH (ref 70–99)
Potassium: 3.3 mmol/L — ABNORMAL LOW (ref 3.5–5.1)
Sodium: 140 mmol/L (ref 135–145)

## 2021-08-09 LAB — TYPE AND SCREEN
ABO/RH(D): O POS
Antibody Screen: NEGATIVE

## 2021-08-09 LAB — HEMOGLOBIN A1C
Hgb A1c MFr Bld: 5.2 % (ref 4.8–5.6)
Mean Plasma Glucose: 102.54 mg/dL

## 2021-08-09 NOTE — Telephone Encounter (Signed)
   Pre-operative Risk Assessment    Patient Name: Michael [name]  DOB: 1955/07/16 MRN: [id number]     [place] 08/09/21; SURGERY IS SET FOR 08/11/21  Request for Surgical Clearance    Procedure:   OPEN RIGHT HEMICOLECTOMY  Date of Surgery:  Clearance 08/11/21                                 Surgeon:  DR. Ria Comment BRIDGES Surgeon's Group or Practice Name:  [facility]  Phone number:  [phone] Fax number:  [phone]   Type of Clearance Requested:   - Medical  - Pharmacy:  Hold Aspirin and Clopidogrel (Plavix)     Type of Anesthesia:  General    Additional requests/questions:    Jiles Prows   08/09/2021, 2:23 PM

## 2021-08-09 NOTE — Telephone Encounter (Signed)
Received call from [place], [place] with [facility].   Reports that patient has not had appointment with cardiology and has not been cleared for surgery.   Of note, patient cancelled appointment on 08/04/2021 for risk stratification.   Patient has also discarded all medications as most were expired and is waiting on new prescriptions. Patient reports that he has not had medications x5 days.   Request sent to [place] [facility]~ [phone]~ fax.

## 2021-08-09 NOTE — Telephone Encounter (Signed)
Tried to call the to set up a tele visit for pre op, though his vm is full and I could not leave a message. See notes from pre op provider in regard to ASA and Plavix. At this time I will send an FYI to requesting office that we just received the clearance today, the pt will need a tele visit but we cannot reach the pt. As well they will need to contact the vascular surgeon office in regard to ASA and Plavix.

## 2021-08-09 NOTE — Telephone Encounter (Signed)
For medical clearance, we will need to do a telephone assessment. The patient's aspirin and Plavix are prescribed by vascular surgery, patient had right TCAR 02/15/21 by Dr. Carlis Abbott.  Please notify requesting provider's office.  Thank you, Emmaline Life, NP-C    08/09/2021, 3:19 PM [place] [address] Office [phone] Fax [phone]

## 2021-08-09 NOTE — Telephone Encounter (Signed)
Calling to f/u on Medical Clearance that was sent. Please advise

## 2021-08-10 ENCOUNTER — Encounter: Payer: Self-pay | Admitting: Nurse Practitioner

## 2021-08-10 ENCOUNTER — Telehealth: Payer: Self-pay | Admitting: *Deleted

## 2021-08-10 ENCOUNTER — Ambulatory Visit (INDEPENDENT_AMBULATORY_CARE_PROVIDER_SITE_OTHER): Payer: Medicare Other | Admitting: Nurse Practitioner

## 2021-08-10 DIAGNOSIS — Z01810 Encounter for preprocedural cardiovascular examination: Secondary | ICD-10-CM

## 2021-08-10 MED ORDER — ISOSORBIDE MONONITRATE ER 30 MG PO TB24: 30.0000 mg | ORAL_TABLET | Freq: Every evening | ORAL | 3 refills | Status: DC

## 2021-08-10 MED ORDER — ATORVASTATIN CALCIUM 80 MG PO TABS
80.0000 mg | ORAL_TABLET | Freq: Every evening | ORAL | 3 refills | Status: DC
Start: 2021-08-10 — End: 2022-04-27

## 2021-08-10 MED ORDER — POTASSIUM CHLORIDE ER 10 MEQ PO TBCR: 10.0000 meq | EXTENDED_RELEASE_TABLET | Freq: Every day | ORAL | 3 refills | Status: DC

## 2021-08-10 MED ORDER — METOPROLOL SUCCINATE ER 25 MG PO TB24: 25.0000 mg | ORAL_TABLET | Freq: Every evening | ORAL | 3 refills | Status: DC

## 2021-08-10 NOTE — Pre-Procedure Instructions (Signed)
After discussing case with Bubba Hales, RN pre-op nurse that spoke to patient, I messaged Dr Constance Haw. Patient told pre-op nurse that has not taken any of his meds for 5 days, including his metoprolol. Patient was not really clear on his last dose of plavix was. Also cardiology has attempted to call him for tele visit to clear him but patient has not called them back. Dr Constance Haw notified cardiology and patient has televisit at 1040 today. She will let us know after this what the next steps are.

## 2021-08-10 NOTE — Pre-Procedure Instructions (Signed)
Attempted to notify patient of time change and it goes to voiemail which mailbox is full.

## 2021-08-10 NOTE — Telephone Encounter (Signed)
Pt agreeable to plan of care for tele visit today. Pt request the 10:40 time slot. Med rec and consent are done.

## 2021-08-10 NOTE — Telephone Encounter (Signed)
Pt agreeable to plan of care for tele visit today. Pt request the 10:40 time slot. Med rec and consent are done.      Patient Consent for Virtual Visit        Michael [name] has provided verbal consent on 08/10/2021 for a virtual visit (video or telephone).   CONSENT FOR VIRTUAL VISIT FOR:  Michael [name]  By participating in this virtual visit I agree to the following:  I hereby voluntarily request, consent and authorize [facility] and its employed or contracted physicians, physician assistants, nurse practitioners or other licensed health care professionals (the Practitioner), to provide me with telemedicine health care services (the "Services") as deemed necessary by the treating Practitioner. I acknowledge and consent to receive the Services by the Practitioner via telemedicine. I understand that the telemedicine visit will involve communicating with the Practitioner through live audiovisual communication technology and the disclosure of certain medical information by electronic transmission. I acknowledge that I have been given the opportunity to request an in-person assessment or other available alternative prior to the telemedicine visit and am voluntarily participating in the telemedicine visit.  I understand that I have the right to withhold or withdraw my consent to the use of telemedicine in the course of my care at any time, without affecting my right to future care or treatment, and that the Practitioner or I may terminate the telemedicine visit at any time. I understand that I have the right to inspect all information obtained and/or recorded in the course of the telemedicine visit and may receive copies of available information for a reasonable fee.  I understand that some of the potential risks of receiving the Services via telemedicine include:  Delay or interruption in medical evaluation due to technological equipment failure or disruption; Information transmitted may not be  sufficient (e.g. poor resolution of images) to allow for appropriate medical decision making by the Practitioner; and/or  In rare instances, security protocols could fail, causing a breach of personal health information.  Furthermore, I acknowledge that it is my responsibility to provide information about my medical history, conditions and care that is complete and accurate to the best of my ability. I acknowledge that Practitioner's advice, recommendations, and/or decision may be based on factors not within their control, such as incomplete or inaccurate data provided by me or distortions of diagnostic images or specimens that may result from electronic transmissions. I understand that the practice of medicine is not an exact science and that Practitioner makes no warranties or guarantees regarding treatment outcomes. I acknowledge that a copy of this consent can be made available to me via my patient portal ([place]), or I can request a printed copy by calling the office of [facility].    I understand that my insurance will be billed for this visit.   I have read or had this consent read to me. I understand the contents of this consent, which adequately explains the benefits and risks of the Services being provided via telemedicine.  I have been provided ample opportunity to ask questions regarding this consent and the Services and have had my questions answered to my satisfaction. I give my informed consent for the services to be provided through the use of telemedicine in my medical care

## 2021-08-10 NOTE — Progress Notes (Signed)
Virtual Visit via Telephone Note   Because of Michael [name] co-morbid illnesses, he is at least at moderate risk for complications without adequate follow up.  This format is felt to be most appropriate for this patient at this time.  The patient did not have access to video technology/had technical difficulties with video requiring transitioning to audio format only (telephone).  All issues noted in this document were discussed and addressed.  No physical exam could be performed with this format.  Please refer to the patient's chart for his consent to telehealth for [facility].  Evaluation Performed:  Preoperative cardiovascular risk assessment _____________   Date:  08/10/2021   Patient ID:  Michael [name], DOB 1956-01-12, MRN [id number] Patient Location:  Home Provider location:   Office  Primary Care Provider:  Jani Gravel, MD Primary Cardiologist:  Rozann Lesches, MD  Chief Complaint / Patient Profile   66 y.o. y/o male with a h/o uncontrolled hypertension, CKD, chronic combined CHF, ischemic heart disease based on abnormal myoview September 2019 with corresponding wall abnormality on echo, bilateral carotid artery disease s/p TCAR,  for asymptomatic high-grade right internal carotid artery stenosis, COPD, who is pending partial colectomy and presents today for telephonic preoperative cardiovascular risk assessment.  Past Medical History    Past Medical History:  Diagnosis Date   Aortic atherosclerosis ([place]) 08/03/2021   CAD (coronary artery disease) 08/03/2021   Nuclear stress test 10/19/2017: Inferior/septal myocardial infarction with mild peri-infarct ischemia, EF 33 Med Rx - no symptoms   Carotid artery disease (HCC)    Chronic diastolic heart failure (HCC)    CKD (chronic kidney disease) stage 3, GFR 30-59 ml/min (HCC)    COPD (chronic obstructive pulmonary disease) ([place])    Dysrhythmia    Essential hypertension    Head and neck cancer ([place]) 2019   Right facial basal  cell carcinoma s/p resection with right partial mastectomy and partal rhinectomy with skin graft (06/13/17)   Iron deficiency anemia    Urinary retention    Past Surgical History:  Procedure Laterality Date   ANKLE CLOSED REDUCTION Right    open reduction   BASAL CELL CARCINOMA EXCISION  2019   at [facility]   BIOPSY  07/19/2021   Procedure: BIOPSY;  Surgeon: Eloise Harman, DO;  [facility];  Service: Endoscopy;;   COLONOSCOPY N/A 05/31/2017   Procedure: COLONOSCOPY;  Surgeon: Daneil Dolin, MD;  [facility];  Service: Endoscopy;  Laterality: N/A;  2:45pm   COLONOSCOPY WITH PROPOFOL N/A 07/19/2021   Procedure: COLONOSCOPY WITH PROPOFOL;  Surgeon: Eloise Harman, DO;  [facility];  Service: Endoscopy;  Laterality: N/A;  3:00pm, moved up to 9:00   CYSTOSCOPY N/A 10/29/2018   Procedure: CYSTOSCOPY, CLOT EVACUATION;  Surgeon: Ceasar Mons, MD;  Location: [facility];  Service: Urology;  Laterality: N/A;   POLYPECTOMY  05/31/2017   Procedure: POLYPECTOMY;  Surgeon: Daneil Dolin, MD;  [facility];  Service: Endoscopy;;   TONSILLECTOMY     TRANSCAROTID ARTERY REVASCULARIZATION  Right 02/15/2021   Procedure: RIGHT TRANSCAROTID ARTERY REVASCULARIZATION;  Surgeon: [name] Heck, MD;  Location: [facility];  Service: Vascular;  Laterality: Right;   ULTRASOUND GUIDANCE FOR VASCULAR ACCESS Left 02/15/2021   Procedure: ULTRASOUND GUIDANCE FOR VASCULAR ACCESS;  Surgeon: [name] Heck, MD;  Location: [facility];  Service: Vascular;  Laterality: Left;   XI ROBOTIC ASSISTED SIMPLE PROSTATECTOMY N/A 10/29/2018   Procedure: XI ROBOTIC ASSISTED SIMPLE PROSTATECTOMY;  Surgeon: Cleon Gustin, MD;  Location: [facility];  Service: Urology;  Laterality: N/A;    Allergies  No Known Allergies  History of Present Illness    Michael [name] is a 66 y.o. male who presents via audio/video conferencing for a telehealth visit today.  Pt was last seen in  cardiology clinic on 03/08/21 by Marcelle Overlie [facility].  At that time Alen Matheson was doing well.  The patient is now pending procedure as outlined above. Since his last visit, he  denies chest pain, shortness of breath, lower extremity edema, fatigue, palpitations, melena, hematuria, hemoptysis, diaphoresis, weakness, presyncope, syncope, orthopnea, and PND.  Home Medications    Prior to Admission medications   Medication Sig Start Date End Date Taking? Authorizing Provider  aspirin EC 81 MG tablet Take 81 mg by mouth daily.    [provider]  atorvastatin (LIPITOR) 80 MG tablet Take 1 tablet (80 mg total) by mouth every evening. Patient not taking: Reported on 07/09/2021 02/18/21   Dagoberto Ligas, PA-C  clopidogrel (PLAVIX) 75 MG tablet Take 1 tablet (75 mg total) by mouth daily. 05/19/21   Satira Sark, MD  ergocalciferol (VITAMIN D2) 1.25 MG (50000 UT) capsule Take 1 capsule (50,000 Units total) by mouth once a week. Patient not taking: Reported on 07/09/2021 04/26/21   Harriett Rush, PA-C  Evolocumab (REPATHA SURECLICK) 892 MG/ML SOAJ Inject 1 pen into the skin every 14 (fourteen) days. 03/09/21   Satira Sark, MD  isosorbide mononitrate (IMDUR) 30 MG 24 hr tablet Take 1 tablet (30 mg total) by mouth every evening. 10/25/18   Satira Sark, MD  metoprolol succinate (TOPROL-XL) 25 MG 24 hr tablet Take 1 tablet (25 mg total) by mouth every evening. 03/04/20   Ahmed Prima, Fransisco Hertz, PA-C  metroNIDAZOLE (FLAGYL) 500 MG tablet Take 2 tablets (1,000 mg total) by mouth as directed. Take 2 flagyl '500mg'$  tablets at 2 pm, 3pm, and 10 pm the day before surgery. 07/28/21   Virl Cagey, MD  neomycin (MYCIFRADIN) 500 MG tablet Take 2 tablets (1,000 mg total) by mouth as directed. Take 2 neomycin '500mg'$  tablets at 2 pm, 3pm, and 10 pm the day before surgery. 07/28/21   Virl Cagey, MD  trimethoprim (TRIMPEX) 100 MG tablet Take 1 tablet (100 mg total) by mouth at bedtime. 06/23/21    McKenzie, Candee Furbish, MD    Physical Exam    Vital Signs:  Talan Gildner does not have vital signs available for review today.  Given telephonic nature of communication, physical exam is limited. AAOx3. NAD. Normal affect.  Speech and respirations are unlabored.  Accessory Clinical Findings    None  Assessment & Plan    1.  Preoperative Cardiovascular Risk Assessment: He does not have any specific cardiac concerns. He may proceed to surgery without further cardiac testing. He is at moderate risk for MACE with a  Revised Cardiac Risk Index (RCRI), his Perioperative Risk of Major Cardiac Event is (%): 6.6. His Functional Capacity in METs is: 7.04 according to the [facility] Activity Status Index (DASI). I have reordered his cardiac medications which he reports are expired. I added potassium chloride 10 mEq daily. Dr. Constance Haw is aware of patient's elevated BP.    A copy of this note will be routed to requesting surgeon.  Time:   Today, I have spent 10 minutes with the patient with telehealth technology discussing medical history, symptoms, and management plan.     Emmaline Life, NP-C  08/10/2021, 9:37 AM [place] [address], [address] Office [phone] Fax [phone]

## 2021-08-10 NOTE — Pre-Procedure Instructions (Signed)
Dr Delma Freeze and Dr Constance Haw spoke and agree ti bring patient in early for a beta blocker and then try and proceed with surgery tomorrow as planned. Both the office and I will try and contact patient to let him know to arrive at 0900 in the morning.

## 2021-08-10 NOTE — Pre-Procedure Instructions (Signed)
Patient called back and verbalized understanding of 0900 arrival time as well as the fact that he will be getting blood pressure meds in the morning prior to surgery.

## 2021-08-11 ENCOUNTER — Inpatient Hospital Stay (HOSPITAL_COMMUNITY)
Admission: RE | Admit: 2021-08-11 | Discharge: 2021-08-14 | DRG: 330 | Disposition: A | Discharge status: Home / Self Care | Payer: Medicare Other | Attending: General Surgery | Admitting: General Surgery

## 2021-08-11 ENCOUNTER — Inpatient Hospital Stay (HOSPITAL_COMMUNITY): Payer: Medicare Other | Admitting: Anesthesiology

## 2021-08-11 ENCOUNTER — Other Ambulatory Visit: Payer: Self-pay

## 2021-08-11 ENCOUNTER — Encounter (HOSPITAL_COMMUNITY): Payer: Self-pay | Admitting: General Surgery

## 2021-08-11 ENCOUNTER — Ambulatory Visit: Payer: Medicaid Other | Admitting: Gastroenterology

## 2021-08-11 ENCOUNTER — Encounter (HOSPITAL_COMMUNITY)
Admission: RE | Disposition: A | Discharge status: Home / Self Care | Payer: Self-pay | Source: Home / Self Care | Attending: General Surgery

## 2021-08-11 DIAGNOSIS — Z85828 Personal history of other malignant neoplasm of skin: Secondary | ICD-10-CM

## 2021-08-11 DIAGNOSIS — N1832 Chronic kidney disease, stage 3b: Secondary | ICD-10-CM | POA: Diagnosis present

## 2021-08-11 DIAGNOSIS — I2510 Atherosclerotic heart disease of native coronary artery without angina pectoris: Secondary | ICD-10-CM | POA: Diagnosis not present

## 2021-08-11 DIAGNOSIS — Z87891 Personal history of nicotine dependence: Secondary | ICD-10-CM | POA: Diagnosis not present

## 2021-08-11 DIAGNOSIS — K439 Ventral hernia without obstruction or gangrene: Secondary | ICD-10-CM | POA: Diagnosis present

## 2021-08-11 DIAGNOSIS — D126 Benign neoplasm of colon, unspecified: Principal | ICD-10-CM | POA: Diagnosis present

## 2021-08-11 DIAGNOSIS — I10 Essential (primary) hypertension: Secondary | ICD-10-CM | POA: Diagnosis present

## 2021-08-11 DIAGNOSIS — Z79899 Other long term (current) drug therapy: Secondary | ICD-10-CM | POA: Diagnosis not present

## 2021-08-11 DIAGNOSIS — I130 Hypertensive heart and chronic kidney disease with heart failure and stage 1 through stage 4 chronic kidney disease, or unspecified chronic kidney disease: Secondary | ICD-10-CM | POA: Diagnosis present

## 2021-08-11 DIAGNOSIS — I5032 Chronic diastolic (congestive) heart failure: Secondary | ICD-10-CM | POA: Diagnosis present

## 2021-08-11 DIAGNOSIS — Z7982 Long term (current) use of aspirin: Secondary | ICD-10-CM | POA: Diagnosis not present

## 2021-08-11 DIAGNOSIS — Z7902 Long term (current) use of antithrombotics/antiplatelets: Secondary | ICD-10-CM | POA: Diagnosis not present

## 2021-08-11 DIAGNOSIS — N1831 Chronic kidney disease, stage 3a: Secondary | ICD-10-CM | POA: Diagnosis present

## 2021-08-11 DIAGNOSIS — J449 Chronic obstructive pulmonary disease, unspecified: Secondary | ICD-10-CM | POA: Diagnosis present

## 2021-08-11 DIAGNOSIS — K8689 Other specified diseases of pancreas: Secondary | ICD-10-CM | POA: Diagnosis present

## 2021-08-11 DIAGNOSIS — K6389 Other specified diseases of intestine: Secondary | ICD-10-CM | POA: Diagnosis present

## 2021-08-11 DIAGNOSIS — J9811 Atelectasis: Secondary | ICD-10-CM | POA: Diagnosis not present

## 2021-08-11 DIAGNOSIS — Z01810 Encounter for preprocedural cardiovascular examination: Principal | ICD-10-CM

## 2021-08-11 DIAGNOSIS — I110 Hypertensive heart disease with heart failure: Secondary | ICD-10-CM

## 2021-08-11 DIAGNOSIS — N183 Chronic kidney disease, stage 3 unspecified: Secondary | ICD-10-CM | POA: Diagnosis present

## 2021-08-11 HISTORY — PX: PARTIAL COLECTOMY: SHX5273

## 2021-08-11 LAB — BASIC METABOLIC PANEL
Anion gap: 8 (ref 5–15)
BUN: 23 mg/dL (ref 8–23)
CO2: 24 mmol/L (ref 22–32)
Calcium: 8.4 mg/dL — ABNORMAL LOW (ref 8.9–10.3)
Chloride: 109 mmol/L (ref 98–111)
Creatinine, Ser: 1.94 mg/dL — ABNORMAL HIGH (ref 0.61–1.24)
GFR, Estimated: 37 mL/min — ABNORMAL LOW (ref >60)
Glucose, Bld: 142 mg/dL — ABNORMAL HIGH (ref 70–99)
Potassium: 3.5 mmol/L (ref 3.5–5.1)
Sodium: 141 mmol/L (ref 135–145)

## 2021-08-11 LAB — CBC
HCT: 35 % — ABNORMAL LOW (ref 39.0–52.0)
Hemoglobin: 10.7 g/dL — ABNORMAL LOW (ref 13.0–17.0)
MCH: 25.1 pg — ABNORMAL LOW (ref 26.0–34.0)
MCHC: 30.6 g/dL (ref 30.0–36.0)
MCV: 82.2 fL (ref 80.0–100.0)
Platelets: 314 10*3/uL (ref 150–400)
RBC: 4.26 MIL/uL (ref 4.22–5.81)
RDW: 14.5 % (ref 11.5–15.5)
WBC: 12.3 10*3/uL — ABNORMAL HIGH (ref 4.0–10.5)
nRBC: 0 % (ref 0.0–0.2)

## 2021-08-11 SURGERY — COLECTOMY, PARTIAL
Anesthesia: General | Site: Abdomen | Laterality: Right

## 2021-08-11 MED ORDER — HEPARIN SODIUM (PORCINE) 5000 UNIT/ML IJ SOLN
5000.0000 [IU] | Freq: Three times a day (TID) | INTRAMUSCULAR | Status: DC
  Filled 2021-08-11 (×4): qty 1

## 2021-08-11 MED ORDER — ONDANSETRON HCL 4 MG/2ML IJ SOLN: 4.0000 mg | Freq: Once | INTRAMUSCULAR | Status: DC | PRN

## 2021-08-11 MED ORDER — METOPROLOL TARTRATE 50 MG PO TABS
50.0000 mg | ORAL_TABLET | Freq: Once | ORAL | Status: AC
  Administered 2021-08-11: 50 mg via ORAL
  Filled 2021-08-11: qty 1

## 2021-08-11 MED ORDER — LACTATED RINGERS IV SOLN: INTRAVENOUS | Status: DC

## 2021-08-11 MED ORDER — 0.9 % SODIUM CHLORIDE (POUR BTL) OPTIME
TOPICAL | Status: DC | PRN
  Administered 2021-08-11: 500 mL
  Administered 2021-08-11 (×2): 1000 mL

## 2021-08-11 MED ORDER — ENSURE PRE-SURGERY PO LIQD
296.0000 mL | Freq: Once | ORAL | Status: DC
  Filled 2021-08-11: qty 296

## 2021-08-11 MED ORDER — BUPIVACAINE LIPOSOME 1.3 % IJ SUSP
INTRAMUSCULAR | Status: AC
  Filled 2021-08-11: qty 20

## 2021-08-11 MED ORDER — ROCURONIUM BROMIDE 10 MG/ML (PF) SYRINGE
PREFILLED_SYRINGE | INTRAVENOUS | Status: AC
  Filled 2021-08-11: qty 30

## 2021-08-11 MED ORDER — ACETAMINOPHEN 500 MG PO TABS
ORAL_TABLET | ORAL | Status: AC
  Filled 2021-08-11: qty 2

## 2021-08-11 MED ORDER — LIDOCAINE HCL (PF) 2 % IJ SOLN
INTRAMUSCULAR | Status: AC
  Filled 2021-08-11: qty 5

## 2021-08-11 MED ORDER — ROCURONIUM BROMIDE 10 MG/ML (PF) SYRINGE
PREFILLED_SYRINGE | INTRAVENOUS | Status: DC | PRN
  Administered 2021-08-11: 10 mg via INTRAVENOUS
  Administered 2021-08-11: 50 mg via INTRAVENOUS

## 2021-08-11 MED ORDER — ASPIRIN 81 MG PO TBEC
81.0000 mg | DELAYED_RELEASE_TABLET | Freq: Every day | ORAL | Status: DC
  Administered 2021-08-11 – 2021-08-14 (×4): 81 mg via ORAL
  Filled 2021-08-11 (×4): qty 1

## 2021-08-11 MED ORDER — ACETAMINOPHEN 500 MG PO TABS
1000.0000 mg | ORAL_TABLET | Freq: Four times a day (QID) | ORAL | Status: DC
  Administered 2021-08-11 – 2021-08-13 (×9): 1000 mg via ORAL
  Filled 2021-08-11 (×9): qty 2

## 2021-08-11 MED ORDER — ONDANSETRON HCL 4 MG/2ML IJ SOLN: 4.0000 mg | Freq: Once | INTRAMUSCULAR | Status: DC

## 2021-08-11 MED ORDER — ROCURONIUM BROMIDE 10 MG/ML (PF) SYRINGE
PREFILLED_SYRINGE | INTRAVENOUS | Status: AC
  Filled 2021-08-11: qty 10

## 2021-08-11 MED ORDER — FENTANYL CITRATE (PF) 250 MCG/5ML IJ SOLN
INTRAMUSCULAR | Status: DC | PRN
  Administered 2021-08-11: 100 ug via INTRAVENOUS

## 2021-08-11 MED ORDER — SODIUM CHLORIDE 0.9 % IV SOLN
INTRAVENOUS | Status: AC
  Filled 2021-08-11: qty 2

## 2021-08-11 MED ORDER — SUCCINYLCHOLINE CHLORIDE 200 MG/10ML IV SOSY
PREFILLED_SYRINGE | INTRAVENOUS | Status: DC | PRN
  Administered 2021-08-11: 100 mg via INTRAVENOUS

## 2021-08-11 MED ORDER — ONDANSETRON HCL 4 MG/2ML IJ SOLN
4.0000 mg | Freq: Four times a day (QID) | INTRAMUSCULAR | Status: DC | PRN
  Administered 2021-08-14: 4 mg via INTRAVENOUS
  Filled 2021-08-11: qty 2

## 2021-08-11 MED ORDER — VASOPRESSIN 20 UNIT/ML IV SOLN
INTRAVENOUS | Status: DC | PRN
  Administered 2021-08-11: 1 [IU] via INTRAVENOUS
  Administered 2021-08-11 (×2): 2 [IU] via INTRAVENOUS
  Administered 2021-08-11: 1 [IU] via INTRAVENOUS

## 2021-08-11 MED ORDER — ACETAMINOPHEN 500 MG PO TABS
1000.0000 mg | ORAL_TABLET | ORAL | Status: AC
  Administered 2021-08-11: 1000 mg via ORAL

## 2021-08-11 MED ORDER — KETAMINE HCL 50 MG/5ML IJ SOSY
PREFILLED_SYRINGE | INTRAMUSCULAR | Status: AC
  Filled 2021-08-11: qty 5

## 2021-08-11 MED ORDER — SUGAMMADEX SODIUM 200 MG/2ML IV SOLN
INTRAVENOUS | Status: DC | PRN
  Administered 2021-08-11: 200 mg via INTRAVENOUS

## 2021-08-11 MED ORDER — ALVIMOPAN 12 MG PO CAPS
ORAL_CAPSULE | ORAL | Status: AC
  Filled 2021-08-11: qty 1

## 2021-08-11 MED ORDER — METOPROLOL SUCCINATE ER 25 MG PO TB24
25.0000 mg | ORAL_TABLET | Freq: Every evening | ORAL | Status: DC
  Administered 2021-08-12 – 2021-08-13 (×2): 25 mg via ORAL
  Filled 2021-08-11 (×3): qty 1

## 2021-08-11 MED ORDER — METOPROLOL TARTRATE 5 MG/5ML IV SOLN: 5.0000 mg | Freq: Four times a day (QID) | INTRAVENOUS | Status: DC | PRN

## 2021-08-11 MED ORDER — DEXAMETHASONE SODIUM PHOSPHATE 10 MG/ML IJ SOLN
INTRAMUSCULAR | Status: DC | PRN
  Administered 2021-08-11: 10 mg via INTRAVENOUS

## 2021-08-11 MED ORDER — ONDANSETRON HCL 4 MG PO TABS: 4.0000 mg | ORAL_TABLET | Freq: Four times a day (QID) | ORAL | Status: DC | PRN

## 2021-08-11 MED ORDER — SACCHAROMYCES BOULARDII 250 MG PO CAPS
250.0000 mg | ORAL_CAPSULE | Freq: Two times a day (BID) | ORAL | Status: DC
  Administered 2021-08-11 – 2021-08-14 (×6): 250 mg via ORAL
  Filled 2021-08-11 (×6): qty 1

## 2021-08-11 MED ORDER — ORAL CARE MOUTH RINSE: 15.0000 mL | Freq: Once | OROMUCOSAL | Status: AC

## 2021-08-11 MED ORDER — CHLORHEXIDINE GLUCONATE CLOTH 2 % EX PADS: 6.0000 | MEDICATED_PAD | Freq: Once | CUTANEOUS | Status: DC

## 2021-08-11 MED ORDER — BUPIVACAINE LIPOSOME 1.3 % IJ SUSP
INTRAMUSCULAR | Status: DC | PRN
  Administered 2021-08-11: 20 mL

## 2021-08-11 MED ORDER — SODIUM CHLORIDE 0.9 % IV SOLN
2.0000 g | Freq: Two times a day (BID) | INTRAVENOUS | Status: AC
  Administered 2021-08-12 – 2021-08-13 (×3): 2 g via INTRAVENOUS
  Filled 2021-08-11 (×3): qty 2

## 2021-08-11 MED ORDER — KETAMINE HCL 10 MG/ML IJ SOLN
INTRAMUSCULAR | Status: DC | PRN
  Administered 2021-08-11: 20 mg via INTRAVENOUS

## 2021-08-11 MED ORDER — PHENYLEPHRINE HCL-NACL 20-0.9 MG/250ML-% IV SOLN
INTRAVENOUS | Status: DC | PRN
  Administered 2021-08-11: 50 ug/min via INTRAVENOUS

## 2021-08-11 MED ORDER — HYDRALAZINE HCL 20 MG/ML IJ SOLN: 10.0000 mg | INTRAMUSCULAR | Status: DC | PRN

## 2021-08-11 MED ORDER — SODIUM CHLORIDE 0.9 % IV SOLN
2.0000 g | INTRAVENOUS | Status: AC
  Administered 2021-08-11: 2 g via INTRAVENOUS

## 2021-08-11 MED ORDER — FENTANYL CITRATE PF 50 MCG/ML IJ SOSY
25.0000 ug | PREFILLED_SYRINGE | INTRAMUSCULAR | Status: DC | PRN
  Administered 2021-08-11: 50 ug via INTRAVENOUS
  Filled 2021-08-11: qty 1

## 2021-08-11 MED ORDER — HEPARIN SODIUM (PORCINE) 5000 UNIT/ML IJ SOLN
5000.0000 [IU] | Freq: Once | INTRAMUSCULAR | Status: AC
  Administered 2021-08-11: 5000 [IU] via SUBCUTANEOUS
  Filled 2021-08-11: qty 1

## 2021-08-11 MED ORDER — VASOPRESSIN 20 UNIT/ML IV SOLN
INTRAVENOUS | Status: AC
  Filled 2021-08-11: qty 1

## 2021-08-11 MED ORDER — ENSURE SURGERY PO LIQD
237.0000 mL | Freq: Two times a day (BID) | ORAL | Status: DC
  Administered 2021-08-12: 237 mL via ORAL
  Filled 2021-08-11 (×10): qty 237

## 2021-08-11 MED ORDER — SIMETHICONE 80 MG PO CHEW: 40.0000 mg | CHEWABLE_TABLET | Freq: Four times a day (QID) | ORAL | Status: DC | PRN

## 2021-08-11 MED ORDER — DEXAMETHASONE SODIUM PHOSPHATE 10 MG/ML IJ SOLN
INTRAMUSCULAR | Status: AC
  Filled 2021-08-11: qty 1

## 2021-08-11 MED ORDER — MORPHINE SULFATE (PF) 2 MG/ML IV SOLN: 2.0000 mg | INTRAVENOUS | Status: DC | PRN

## 2021-08-11 MED ORDER — PROPOFOL 10 MG/ML IV BOLUS
INTRAVENOUS | Status: AC
  Filled 2021-08-11: qty 20

## 2021-08-11 MED ORDER — ENSURE PRE-SURGERY PO LIQD
592.0000 mL | Freq: Once | ORAL | Status: AC
  Administered 2021-08-11: 296 mL via ORAL
  Filled 2021-08-11: qty 592

## 2021-08-11 MED ORDER — ONDANSETRON HCL 4 MG/2ML IJ SOLN
INTRAMUSCULAR | Status: DC | PRN
  Administered 2021-08-11: 4 mg via INTRAVENOUS

## 2021-08-11 MED ORDER — ISOSORBIDE MONONITRATE ER 60 MG PO TB24
30.0000 mg | ORAL_TABLET | Freq: Every evening | ORAL | Status: DC
  Administered 2021-08-12 – 2021-08-13 (×2): 30 mg via ORAL
  Filled 2021-08-11 (×2): qty 1

## 2021-08-11 MED ORDER — ONDANSETRON HCL 4 MG/2ML IJ SOLN
INTRAMUSCULAR | Status: AC
  Administered 2021-08-11: 4 mg
  Filled 2021-08-11: qty 2

## 2021-08-11 MED ORDER — ALUM & MAG HYDROXIDE-SIMETH 200-200-20 MG/5ML PO SUSP
30.0000 mL | Freq: Four times a day (QID) | ORAL | Status: DC | PRN
Start: 2021-08-11 — End: 2021-08-14

## 2021-08-11 MED ORDER — KETOROLAC TROMETHAMINE 15 MG/ML IJ SOLN: 15.0000 mg | Freq: Four times a day (QID) | INTRAMUSCULAR | Status: DC | PRN

## 2021-08-11 MED ORDER — CHLORHEXIDINE GLUCONATE 0.12 % MT SOLN: 15.0000 mL | Freq: Once | OROMUCOSAL | Status: AC

## 2021-08-11 MED ORDER — FENTANYL CITRATE (PF) 250 MCG/5ML IJ SOLN
INTRAMUSCULAR | Status: AC
  Filled 2021-08-11: qty 5

## 2021-08-11 MED ORDER — PHENYLEPHRINE HCL (PRESSORS) 10 MG/ML IV SOLN
INTRAVENOUS | Status: DC | PRN
  Administered 2021-08-11 (×2): 80 ug via INTRAVENOUS
  Administered 2021-08-11: 160 ug via INTRAVENOUS

## 2021-08-11 MED ORDER — DIPHENHYDRAMINE HCL 50 MG/ML IJ SOLN: 12.5000 mg | Freq: Four times a day (QID) | INTRAMUSCULAR | Status: DC | PRN

## 2021-08-11 MED ORDER — ALVIMOPAN 12 MG PO CAPS
12.0000 mg | ORAL_CAPSULE | ORAL | Status: AC
  Administered 2021-08-11: 12 mg via ORAL

## 2021-08-11 MED ORDER — ATORVASTATIN CALCIUM 40 MG PO TABS
80.0000 mg | ORAL_TABLET | Freq: Every evening | ORAL | Status: DC
  Administered 2021-08-11 – 2021-08-13 (×3): 80 mg via ORAL
  Filled 2021-08-11 (×3): qty 2

## 2021-08-11 MED ORDER — DIPHENHYDRAMINE HCL 12.5 MG/5ML PO ELIX: 12.5000 mg | ORAL_SOLUTION | Freq: Four times a day (QID) | ORAL | Status: DC | PRN

## 2021-08-11 MED ORDER — ALVIMOPAN 12 MG PO CAPS
12.0000 mg | ORAL_CAPSULE | Freq: Two times a day (BID) | ORAL | Status: DC
  Administered 2021-08-12 – 2021-08-13 (×4): 12 mg via ORAL
  Filled 2021-08-11 (×6): qty 1

## 2021-08-11 MED ORDER — ONDANSETRON HCL 4 MG/2ML IJ SOLN
INTRAMUSCULAR | Status: AC
  Filled 2021-08-11: qty 2

## 2021-08-11 MED ORDER — KETOROLAC TROMETHAMINE 15 MG/ML IJ SOLN
15.0000 mg | Freq: Four times a day (QID) | INTRAMUSCULAR | Status: AC
  Administered 2021-08-11: 15 mg via INTRAVENOUS
  Filled 2021-08-11: qty 1

## 2021-08-11 MED ORDER — PROPOFOL 10 MG/ML IV BOLUS
INTRAVENOUS | Status: DC | PRN
  Administered 2021-08-11: 130 mg via INTRAVENOUS

## 2021-08-11 MED ORDER — HYDROCODONE-ACETAMINOPHEN 7.5-325 MG PO TABS: 1.0000 | ORAL_TABLET | Freq: Once | ORAL | Status: DC | PRN

## 2021-08-11 MED ORDER — LIDOCAINE 2% (20 MG/ML) 5 ML SYRINGE
INTRAMUSCULAR | Status: DC | PRN
  Administered 2021-08-11: 50 mg via INTRAVENOUS

## 2021-08-11 MED ORDER — OXYCODONE HCL 5 MG PO TABS
5.0000 mg | ORAL_TABLET | ORAL | Status: DC | PRN
  Administered 2021-08-12: 10 mg via ORAL
  Filled 2021-08-11: qty 2

## 2021-08-11 SURGICAL SUPPLY — 47 items
APPLICATOR COTTON TIP 6 STRL (MISCELLANEOUS) IMPLANT
APPLICATOR COTTON TIP 6IN STRL (MISCELLANEOUS) ×2
BAG HAMPER (MISCELLANEOUS) ×2 IMPLANT
COVER LIGHT HANDLE STERIS (MISCELLANEOUS) ×8 IMPLANT
DRSG OPSITE POSTOP 4X8 (GAUZE/BANDAGES/DRESSINGS) ×1 IMPLANT
ELECT REM PT RETURN 9FT ADLT (ELECTROSURGICAL) ×2
ELECTRODE REM PT RTRN 9FT ADLT (ELECTROSURGICAL) ×1 IMPLANT
GLOVE BIO SURGEON STRL SZ 6.5 (GLOVE) ×3 IMPLANT
GLOVE BIO SURGEON STRL SZ7 (GLOVE) ×1 IMPLANT
GLOVE BIOGEL PI IND STRL 6.5 (GLOVE) ×1 IMPLANT
GLOVE BIOGEL PI IND STRL 7.0 (GLOVE) ×2 IMPLANT
GLOVE BIOGEL PI IND STRL 7.5 (GLOVE) IMPLANT
GLOVE BIOGEL PI INDICATOR 6.5 (GLOVE) ×3
GLOVE BIOGEL PI INDICATOR 7.0 (GLOVE) ×2
GLOVE BIOGEL PI INDICATOR 7.5 (GLOVE) ×1
GLOVE SURG SS PI 6.5 STRL IVOR (GLOVE) ×4 IMPLANT
GOWN STRL REUS W/TWL LRG LVL3 (GOWN DISPOSABLE) ×12 IMPLANT
INST SET MAJOR GENERAL (KITS) ×2 IMPLANT
KIT BLADEGUARD II DBL (SET/KITS/TRAYS/PACK) ×2 IMPLANT
KIT TURNOVER KIT A (KITS) ×2 IMPLANT
LIGASURE IMPACT 36 18CM CVD LR (INSTRUMENTS) ×2 IMPLANT
MANIFOLD NEPTUNE II (INSTRUMENTS) ×2 IMPLANT
NDL HYPO 18GX1.5 BLUNT FILL (NEEDLE) ×1 IMPLANT
NDL HYPO 21X1.5 SAFETY (NEEDLE) ×1 IMPLANT
NEEDLE HYPO 18GX1.5 BLUNT FILL (NEEDLE) ×2 IMPLANT
NEEDLE HYPO 21X1.5 SAFETY (NEEDLE) ×2 IMPLANT
NS IRRIG 1000ML POUR BTL (IV SOLUTION) ×4 IMPLANT
NS IRRIG 500ML POUR BTL (IV SOLUTION) ×1 IMPLANT
PACK COLON (CUSTOM PROCEDURE TRAY) ×2 IMPLANT
PAD ARMBOARD 7.5X6 YLW CONV (MISCELLANEOUS) ×2 IMPLANT
PENCIL SMOKE EVACUATOR COATED (MISCELLANEOUS) ×2 IMPLANT
RELOAD PROXIMATE 75MM BLUE (ENDOMECHANICALS) ×2 IMPLANT
RELOAD STAPLE 75 3.8 BLU REG (ENDOMECHANICALS) IMPLANT
RETRACTOR WND ALEXIS-O 25 LRG (MISCELLANEOUS) IMPLANT
RTRCTR WOUND ALEXIS O 25CM LRG (MISCELLANEOUS) ×2
SPONGE T-LAP 18X18 ~~LOC~~+RFID (SPONGE) ×6 IMPLANT
STAPLER GUN LINEAR PROX 60 (STAPLE) ×2 IMPLANT
STAPLER PROXIMATE 75MM BLUE (STAPLE) ×2 IMPLANT
STAPLER VISISTAT (STAPLE) ×2 IMPLANT
SUT CHROMIC 3 0 SH 27 (SUTURE) ×1 IMPLANT
SUT PDS AB CT VIOLET #0 27IN (SUTURE) ×4 IMPLANT
SUT SILK 3 0 SH CR/8 (SUTURE) ×3 IMPLANT
SUT VIC AB 3-0 SH 27 (SUTURE) ×1
SUT VIC AB 3-0 SH 27X BRD (SUTURE) ×1 IMPLANT
SYR 20ML LL LF (SYRINGE) ×2 IMPLANT
TRAY FOLEY MTR SLVR 16FR STAT (SET/KITS/TRAYS/PACK) ×2 IMPLANT
YANKAUER SUCT BULB TIP 10FT TU (MISCELLANEOUS) ×2 IMPLANT

## 2021-08-11 NOTE — Interval H&P Note (Signed)
History and Physical Interval Note:  08/11/2021 1:24 PM  Michael [name]  has presented today for surgery, with the diagnosis of COLON MASS.  The various methods of treatment have been discussed with the patient and family. After consideration of risks, benefits and other options for treatment, the patient has consented to  Procedure(s): PARTIAL COLECTOMY, OPEN RIGHT HEMICOLECTOMY (Right) as a surgical intervention.  The patient's history has been reviewed, patient examined, no change in status, stable for surgery.  I have reviewed the patient's chart and labs.  Questions were answered to the patient's satisfaction.    Right hemicolectomy ,pre op risk stratification done, and prep completed. Metoprolol given this AM for BP control, will get hospitalist help post op given his HTN issues. Virl Cagey

## 2021-08-11 NOTE — Anesthesia Postprocedure Evaluation (Signed)
Anesthesia Post Note  Patient: Michael [name]  Procedure(s) Performed: PARTIAL COLECTOMY, OPEN RIGHT HEMICOLECTOMY (Right: Abdomen)  Patient location during evaluation: PACU Anesthesia Type: General Level of consciousness: awake and alert Pain management: pain level controlled Vital Signs Assessment: post-procedure vital signs reviewed and stable Respiratory status: spontaneous breathing, nonlabored ventilation, respiratory function stable and patient connected to nasal cannula oxygen Cardiovascular status: blood pressure returned to baseline and stable Postop Assessment: no apparent nausea or vomiting Anesthetic complications: no   There were no known notable events for this encounter.   Last Vitals:  Vitals:   08/11/21 1715 08/11/21 1735  BP: (!) 165/90 (!) 177/93  Pulse: 65 70  Resp: (!) 22 16  Temp:  36.7 C  SpO2: 96% 93%    Last Pain:  Vitals:   08/11/21 1735  TempSrc: Oral  PainSc:                  Trixie Rude

## 2021-08-11 NOTE — Transfer of Care (Signed)
Immediate Anesthesia Transfer of Care Note  Patient: Michael [name]  Procedure(s) Performed: PARTIAL COLECTOMY, OPEN RIGHT HEMICOLECTOMY (Right: Abdomen)  Patient Location: PACU  Anesthesia Type:General  Level of Consciousness: awake, alert  and oriented  Airway & Oxygen Therapy: Patient Spontanous Breathing and Patient connected to face mask  Post-op Assessment: Report given to RN and Post -op Vital signs reviewed and stable  Post vital signs: Reviewed and stable  Last Vitals:  Vitals Value Taken Time  BP 153/76 08/11/21 1545  Temp    Pulse 65 08/11/21 1548  Resp 18 08/11/21 1545  SpO2 100 % 08/11/21 1548  Vitals shown include unvalidated device data.  Last Pain:  Vitals:   08/11/21 0939  TempSrc: Oral  PainSc: 0-No pain         Complications: No notable events documented.

## 2021-08-11 NOTE — Anesthesia Procedure Notes (Signed)
Procedure Name: Intubation Date/Time: 08/11/2021 2:00 PM  Performed by: Maude Leriche, CRNAPre-anesthesia Checklist: Patient identified, Emergency Drugs available, Suction available and Patient being monitored Patient Re-evaluated:Patient Re-evaluated prior to induction Oxygen Delivery Method: Circle system utilized Preoxygenation: Pre-oxygenation with 100% oxygen Induction Type: IV induction Ventilation: Mask ventilation without difficulty Laryngoscope Size: Glidescope and 4 Tube type: Oral Tube size: 7.0 mm Number of attempts: 1 Airway Equipment and Method: Stylet and Video-laryngoscopy Placement Confirmation: ETT inserted through vocal cords under direct vision, positive ETCO2 and breath sounds checked- equal and bilateral Secured at: 23 cm Tube secured with: Tape Dental Injury: Teeth and Oropharynx as per pre-operative assessment  Comments: Elective glidescope for limited oral opening.

## 2021-08-11 NOTE — Anesthesia Postprocedure Evaluation (Signed)
Anesthesia Post Note  Patient: Michael [name]  Procedure(s) Performed: PARTIAL COLECTOMY, OPEN RIGHT HEMICOLECTOMY (Right: Abdomen)  Patient location during evaluation: Phase II Anesthesia Type: General Level of consciousness: awake and alert Pain management: pain level controlled Vital Signs Assessment: post-procedure vital signs reviewed and stable Respiratory status: spontaneous breathing, nonlabored ventilation, respiratory function stable and patient connected to nasal cannula oxygen Cardiovascular status: blood pressure returned to baseline and stable Postop Assessment: no apparent nausea or vomiting Anesthetic complications: no   There were no known notable events for this encounter.   Last Vitals:  Vitals:   08/11/21 1600 08/11/21 1615  BP: (!) 148/83 (!) 154/79  Pulse: 63 63  Resp: (!) 8   Temp:    SpO2: 94% 94%    Last Pain:  Vitals:   08/11/21 1600  TempSrc:   PainSc: 3                  Trixie Rude

## 2021-08-11 NOTE — Assessment & Plan Note (Signed)
Creatinine 1.65, improved compared to baseline.

## 2021-08-11 NOTE — Assessment & Plan Note (Addendum)
Status post right hemicolectomy by Dr. Constance Haw today 7/12.   -Will check postop CBC and BMP

## 2021-08-11 NOTE — Op Note (Signed)
[facility] Operative Note  08/11/21  Preoperative Diagnosis: Ascending colon mass    Postoperative Diagnosis: Same   Procedure(s) Performed: Right hemicolectomy    Surgeon: Ria Comment C. Constance Haw, MD   Assistants: Dr. Okey Dupre, DO    Anesthesia: General endotracheal   Anesthesiologist: Dr. Delma Freeze, MD    Specimens: Right colon   Estimated Blood Loss: Minimal   Blood Replacement: None    Complications: None   Wound Class: Clean contaminated    Operative Indications: Mr. Bozzo is a 66 yo with an ascending colon mass actually found in 2019 from a PET an then evaluated by colonoscopy. He then was lost to follow up and had a repeat colonoscopy this year for the mass and concern for colon cancer. His pathology has not returned with invasive cancer, but given his prior finding and colonoscopy in 2019 and the PET we assume this could be cancer. We discussed excision with right hemicolectomy and risk of bleeding, infection, anastomotic leak, injury to the ureter or other organ, needing further surgery.  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 I discussed this with Dr. Abbey Chatters, GI and he discussed with Dr. Rush Landmark, and given that he is asymptomatic, we determined it was best to evaluate this further and determine treatment needs after his colectomy.   Findings: Right colon with ascending colon mass; tattoo in transverse colon from prior polyp removed in 2019 that was benign    Procedure: The patient was taken to the operating room and placed supine. General endotracheal anesthesia was induced. Intravenous antibiotics were  administered per protocol.  An orogastric tube positioned to decompress the stomach. A foley catheter was placed. The abdomen was prepared and draped in the usual sterile fashion.   A midline incision was made to include his incision hernia from his robotic  prostatectomy.  This was carried down to the fascia and the peritoneal cavity was entered with care using scissors. With care some omentum that was adherent to the abdominal wall was taken down with a Ligasure. A wound protector was placed.  The small bowel was tucked into the left lower quadrant with a laparotomy pad. The terminal ileum was identified and transected with a 75 mm linear cutting stapler.  The cecum and ascending colon were mobilized with  cautery at the white line of toldt. The right ureter was identified and protected. The hepatic flexure was taken down with the Ligasure. The lesser sac was opened and the transverse colon was separated from the omentum, and the colon was mobilized to the hepatic flexure with the Ligasure laterally. The duodenum was identified and protected. The mesentery was swept medially and the retroperitoneal structures were protected. The distal point of transection was taken distal to the transverse colon tattoo due to there being other polyps noted on the most recent colonoscopy. This was taken with a 75 mm linear cutting stapler. The mesentery was taken connecting the two ends, ensuring adequate lymph node harvest with the Ligasure.    The two ends were placed in alignment without any twisting of the bowel. Using third linear 75 mm stapler the side to side anastomosis was done in the standard fashion on the antimesenteric boarders. The common colotomy was closed with a TA 60 mm stapler. The staple line was oversewn with 3-0 Silk Lembert suture. Two crotch sutures were placed with 3-0 Silk suture. An additional silk suture was placed further down  on the crotch to ensure no separation of the staple line.  The mesentery was closed with 3-0 Chromic gut. Irrigation was performed. Hemostasis was achieved. Omentum was placed over the anastomosis.   The entire team changed gown and gloves and new closing equipment and drapes were used. The fascia was closed in the standard  fashion using 0 PDS suture. The incision was irrigated and the skin was closed with staples. A honeycomb dressing was placed.   Dr. Okey Dupre was assisting throughout the procedure and was present for the critical portions of the case including the dissection and anastomosis.    Final inspection revealed acceptable hemostasis. All counts were correct at the end of the case. The patient was awakened from anesthesia and extubated without complication.  The patient went to the PACU in stable condition.   Curlene Labrum, MD [facility] [address] [place], [place] [address] [phone] (office)

## 2021-08-11 NOTE — H&P (Signed)
[facility] History and Physical   Reason for Referral: Ascending colon mass Referring Physician:  Dr. Abbey Chatters    Chief Complaint   Follow-up        Michael [name] is a 66 y.o. male.  HPI:  Michael [name] is a 66 yo who was known to me after a biopsy of his nares in 2019 for basal cell cancer that was resected with rhinoplasty and skin graft 05/2017 at [facility]. He had a PET scan at that time that demonstrated concern for a mass in the ascending colon lighting up and he had a colonoscopy in 2019 with Dr. Gala Romney that demonstrated multiple polyps with tubular adenoma and was referred for surgical excision of his colon due to the volume and concern for more extensive disease. He was then lost to follow up and with GI and never saw me regarding a colectomy.    He then was seen again by GI after having some dark stools per the notes but he denies any blood Bms or dark stools now. He denies any abdominal pain. He had a colonoscopy with Dr. Abbey Chatters a few weeks ago and pathology still showed only fragments of tubulovillous adenoma but with large amounts of tissue and mass remaining. A CT a/p demonstrated this mass in the colon and also concern for a dilated pancreatic duct likely from pancreatitis but with recommendation of MRI to ensure no pancreatic mass.    Pathology/ Colonoscopy 07/2021: Fragments of tubulovillous adenoma.       Past Medical History:  Diagnosis Date   Carotid artery disease (HCC)     Chronic diastolic heart failure (HCC)     CKD (chronic kidney disease) stage 3, GFR 30-59 ml/min (HCC)     COPD (chronic obstructive pulmonary disease) (HCC)     Coronary artery disease     Dysrhythmia     Essential hypertension     Head and neck cancer ([place]) 2019    Right facial basal cell carcinoma s/p resection with right partial mastectomy and partal rhinectomy with skin graft (06/13/17)   Iron deficiency anemia     Urinary retention             Past Surgical History:   Procedure Laterality Date   ANKLE CLOSED REDUCTION Right      open reduction   BASAL CELL CARCINOMA EXCISION   2019    [facility]   BIOPSY   07/19/2021    Procedure: BIOPSY;  Surgeon: Eloise Harman, DO;  [facility];  Service: Endoscopy;;   COLONOSCOPY N/A 05/31/2017    Procedure: COLONOSCOPY;  Surgeon: Daneil Dolin, MD;  [facility];  Service: Endoscopy;  Laterality: N/A;  2:45pm   COLONOSCOPY WITH PROPOFOL N/A 07/19/2021    Procedure: COLONOSCOPY WITH PROPOFOL;  Surgeon: Eloise Harman, DO;  [facility];  Service: Endoscopy;  Laterality: N/A;  3:00pm, moved up to 9:00   CYSTOSCOPY N/A 10/29/2018    Procedure: CYSTOSCOPY, CLOT EVACUATION;  Surgeon: Ceasar Mons, MD;  [facility];  Service: Urology;  Laterality: N/A;   POLYPECTOMY   05/31/2017    Procedure: POLYPECTOMY;  Surgeon: Daneil Dolin, MD;  [facility];  Service: Endoscopy;;   TONSILLECTOMY       TRANSCAROTID ARTERY REVASCULARIZATION  Right 02/15/2021    Procedure: RIGHT TRANSCAROTID ARTERY REVASCULARIZATION;  Surgeon: [name] Heck, MD;  Location: [place];  Service: Vascular;  Laterality: Right;   ULTRASOUND GUIDANCE FOR VASCULAR  ACCESS Left 02/15/2021    Procedure: ULTRASOUND GUIDANCE FOR VASCULAR ACCESS;  Surgeon: [name] Heck, MD;  Location: [place];  Service: Vascular;  Laterality: Left;   XI ROBOTIC ASSISTED SIMPLE PROSTATECTOMY N/A 10/29/2018    Procedure: XI ROBOTIC ASSISTED SIMPLE PROSTATECTOMY;  Surgeon: Cleon Gustin, MD;  [facility];  Service: Urology;  Laterality: N/A;           Family History  Problem Relation Age of Onset   Stroke Father     Cirrhosis Mother     Colon cancer Neg Hx        Social History         Tobacco Use   Smoking status: Former      Packs/day: 0.50      Years: 40.00      Total pack years: 20.00      Types: Cigarettes      Quit date: 07/02/2021      Years since quitting: 0.0      Passive  exposure: Never   Smokeless tobacco: Never   Tobacco comments:      Quit [name] 06/09/21  Vaping Use   Vaping Use: Never used  Substance Use Topics   Alcohol use: Not Currently   Drug use: Never      Medications: I have reviewed the patient's current medications. Allergies as of 07/28/2021   No Known Allergies         Medication List           Accurate as of July 28, 2021 11:59 PM. If you have any questions, ask your nurse or doctor.              aspirin EC 81 MG tablet Take 81 mg by mouth daily.    atorvastatin 80 MG tablet Commonly known as: LIPITOR Take 1 tablet (80 mg total) by mouth every evening.    clopidogrel 75 MG tablet Commonly known as: PLAVIX Take 1 tablet (75 mg total) by mouth daily.    ergocalciferol 1.25 MG (50000 UT) capsule Commonly known as: VITAMIN D2 Take 1 capsule (50,000 Units total) by mouth once a week.    isosorbide mononitrate 30 MG 24 hr tablet Commonly known as: IMDUR Take 1 tablet (30 mg total) by mouth every evening.    metoprolol succinate 25 MG 24 hr tablet Commonly known as: TOPROL-XL Take 1 tablet (25 mg total) by mouth every evening.    metroNIDAZOLE 500 MG tablet Commonly known as: Flagyl Take 2 tablets (1,000 mg total) by mouth as directed. Take 2 flagyl '500mg'$  tablets at 2 pm, 3pm, and 10 pm the day before surgery. Started by: Virl Cagey, MD    neomycin 500 MG tablet Commonly known as: MYCIFRADIN Take 2 tablets (1,000 mg total) by mouth as directed. Take 2 neomycin '500mg'$  tablets at 2 pm, 3pm, and 10 pm the day before surgery. Started by: Virl Cagey, MD    Repatha SureClick 086 MG/ML Soaj Generic drug: Evolocumab Inject 1 pen into the skin every 14 (fourteen) days.    trimethoprim 100 MG tablet Commonly known as: TRIMPEX Take 1 tablet (100 mg total) by mouth at bedtime.               ROS:  A comprehensive review of systems was negative except for: Ears, nose, mouth, throat, and face: positive for  right nares missing, skin graft in place, some contraction of his mouth upward Gastrointestinal: positive for ascending colon mass  Blood pressure (!) 215/109, pulse 74, temperature 97.6 F (36.4 C), temperature source Oral, resp. rate 14, height 6' (1.829 m), weight 164 lb (74.4 kg), SpO2 98 %. Physical Exam Vitals reviewed.  Constitutional:      Appearance: Normal appearance.  HENT:     Head: Normocephalic.     Nose:     Comments: No right nares, skin graft over right cheek and prior nares area, some superior contraction of his lip upward [name] the right     Mouth/Throat:     Mouth: Mucous membranes are moist.  Eyes:     Extraocular Movements: Extraocular movements intact.  Cardiovascular:     Rate and Rhythm: Normal rate and regular rhythm.  Pulmonary:     Effort: Pulmonary effort is normal.     Breath sounds: Normal breath sounds.  Abdominal:     General: There is no distension.     Palpations: Abdomen is soft.     Tenderness: There is no abdominal tenderness.     Hernia: A hernia is present.     Comments: Right of umbilicus, reducible hernia   Musculoskeletal:        General: Normal range of motion.     Cervical back: Normal range of motion.  Skin:    General: Skin is warm.  Neurological:     General: No focal deficit present.     Mental Status: He is alert and oriented to person, place, and time.  Psychiatric:        Mood and Affect: Mood normal.        Behavior: Behavior normal.        Results: personally reviewed- ascending colon mass noted, pancreatic duct dilation, hernia noted right of midline   CLINICAL DATA:  A history of polypoid lesion in the ascending colon. Rectal bleeding.   EXAM: CT ABDOMEN AND PELVIS WITH CONTRAST   TECHNIQUE: Multidetector CT imaging of the abdomen and pelvis was performed using the standard protocol following bolus administration of intravenous contrast.   RADIATION DOSE REDUCTION: This exam was performed according to  the departmental dose-optimization program which includes automated exposure control, adjustment of the mA and/or kV according to patient size and/or use of iterative reconstruction technique.   CONTRAST:  62m OMNIPAQUE IOHEXOL 300 MG/ML  SOLN   COMPARISON:  CT 07/30/2018   FINDINGS: Lower chest: Lung bases are clear.   Hepatobiliary: Several low-density lesions liver most consistent benign cysts. Large gallstone within the fundus of the gallbladder measures 2.8 cm. No gallbladder distension or inflammation identified.   Pancreas: There is a elongated coarse calcification in the head of the pancreas measuring 2.5 cm (image 21/series 2). Proximal to this coarse calcification there is pancreatic duct dilatation to 6 mm (image 16/2). There is atrophy in the body and tail the pancreas associated duct dilatation. These findings are new from CT 07/30/2018.   Spleen: Normal spleen   Adrenals/urinary tract: Adrenal glands normal. Bilateral renal cortical scarring. No obstructive uropathy. Normal bladder.   Stomach/Bowel: Stomach duodenum small-bowel normal. Terminal ileum and appendix are normal.   There is a lobular filling defect within the ascending colon/hepatic flexure extending over 8-10 cm. This lobular mass fills the lumen of the hepatic flexure. There is a fatty lesion centrally within the mass measuring 1.5 cm. There is no obstruction of colonic contents proximal to this long lobulated mass. Transverse colon enters and exits a ventral abdominal hernia without obstruction. The LEFT colon is normal. Rectosigmoid colon normal.   Vascular/Lymphatic:  Abdominal aorta is normal caliber with atherosclerotic calcification. There is no retroperitoneal or periportal lymphadenopathy. No pelvic lymphadenopathy.   Reproductive: TURP defect in the prostate gland   Other: No free fluid.   Musculoskeletal: No aggressive osseous lesion.   IMPRESSION: 1. Lobular intraluminal mass  at the colon of the hepatic flexure measures up to 8 cm in length. No evidence of bowel obstruction. Recommend correlation with prior endoscopy and biopsies. Colonic neoplasm indicated by imaging. 2. New pancreatic ductal dilatationand atrophy appears related to elongated coarse calcification in the head of the pancreas. Favored sequelae of chronic pancreatitis. Cannot exclude a pancreatic neoplastic process. Recommend correlation with more recent cross-sectional imaging. If none available MRI pancreatic protocol with contrast may be prudent to exclude a pancreatic neoplasm.   These results will be called to the ordering clinician or representative by the Radiologist Assistant, and communication documented in the PACS or [employer].     Electronically Signed   By: Suzy Bouchard M.D.   [name]: 07/05/2021 16:20   Assessment & Plan:  Michael [name] is a 66 y.o. male with an ascending colon mass that has not shown cancer but was lighting up some [name] PET 2019 and has grown since the prior colonoscopy. He has seen cardiology in the past and follows with Dr. Domenic Polite. He has no reported chest pain or shortness of breath. He had a procedure with Vascular 01/2021 (transcarotid revascularlization) and did well. I discussed his nares and lip contract with Dr. Briant Cedar and he did not think this would be a problem for surgery at [facility] as he has been intubated recently without issues.     -MRI ordered given this pancreatic dilation which is new from prior CT in 2020, will get it scheduled  -Last saw cardiology 2022 and will get him risk stratified to ensure he does not need anything else prior to surgery  -Discussed open partial colectomy /right hemicolectomy and risk of bleeding, infection, anastomotic leak, injury to ureter or other organs. He is adamant he does not want a bag, and discussed this would only be done if there was a complication. Discussed them finding cancer and needing additional  treatment. Discussed crossing for blood and risk of allergic reactions and infections. Discussed that MRI needs to be done to ensure he does not anything going [name] with his pancreas would require surgery.  Discussed repairing his hernia primarily at the time of the colectomy.    Colon Preparation:  Buy from the Store: Miralax bottle [id number]).  Gatorade 64 oz (not red). Dulcolax tablets.    The Day Prior to Surgery: Take 4 ducolax tablets at 7am with water. Drink plenty of clear liquids all day to avoid dehydration, no solid food.    Mix the bottle of Miralax and 64 oz of Gatorade and drink this mixture starting at 10am. Drink it gradually over the next few hours, 8 ounces every 15-30 minutes until it is gone. Finish this by 2pm.  Take 2 neomycin '500mg'$  tablets and 2 metronidazole '500mg'$  tablets at 2 pm. Take 2 neomycin '500mg'$  tablets and 2 metronidazole '500mg'$  tablets at 3pm. Take 2 neomycin '500mg'$  tablets and 2 metronidazole '500mg'$  tablets at 10pm.    Do not eat or drink anything after midnight the night before your surgery.  Do not eat or drink anything that morning, and take medications as instructed by the hospital staff [name] your preoperative visit.    Will get him scheduled for 7/12 but will need cardiology and MRI  before this, so if have to move the surgery back will move it back if needed.          Future Appointments  Date Time Provider [place]  08/06/2021 10:30 AM [facility] PAT 2 [facility] None  08/11/2021 10:00 AM Mahala Menghini, PA-C RGA-RGA RGA  08/19/2021 10:10 AM [facility] LAB AP-AC[facility]A None  08/26/2021 10:00 AM Harriett Rush, PA-C AP-ACAPA None  06/15/2022  9:10 AM McKenzie, Candee Furbish, MD AUR-AUR None      All questions were answered to the satisfaction of the patient.     Virl Cagey 07/29/2021, 11:55 AM

## 2021-08-11 NOTE — Assessment & Plan Note (Signed)
As seen on follow-up MRI.  Found to have pancreatic duct stones..  Dr. Constance Haw note, he is asymptomatic, this should be evaluated further and treatment needs determined after colectomy.

## 2021-08-11 NOTE — Consult Note (Signed)
Initial Consultation Note   Patient: Michael [name] XNA:[id number] DOB: 03-31-1955 PCP: Jani Gravel, MD DOA: 08/11/2021 DOS: the patient was seen and examined on 08/11/2021 Primary service: Virl Cagey, MD  Referring physician: Dr. Constance Haw  Reason for consult: Management of medical comorbidities  Assessment/Plan:  Assessment and Plan: * Colonic mass Status post right hemicolectomy by Dr. Constance Haw today 7/12.   -Will check postop CBC and BMP   Dilation of pancreatic duct As seen on follow-up MRI.  Found to have pancreatic duct stones..  Dr. Constance Haw note, he is asymptomatic, this should be evaluated further and treatment needs determined after colectomy.  Stage 3a chronic kidney disease (HCC) Creatinine 1.65, improved compared to baseline.  Essential hypertension Blood pressure elevated mostly ranging from 542H to 062 systolic. -Resume home meds Imdur, metoprolol -Monitor for now if adjustments of home med is needed -As needed hydralazine for systolic greater than 376.   DVT prophylaxis, per general surgery-heparin  TRH will continue to follow the patient.    HPI: Michael [name] is a 66 y.o. male with past medical history of hypertension, COPD, chronic diastolic congestive heart failure, coronary artery disease. At the time of my evaluation patient is a few hours postop.  He is awake and alert, on nasal cannula and able to answer questions.  Patient was admitted to the hospital for open right hemicolectomy for colon mass.  Patient tolerated procedure well.  He has no complaints at the time of my evaluation.  Hospitalist consulted for management of his blood pressure.  Patient had PET scan 2019, that showed concern for a mass in the ascending colon, subsequently had colonoscopy 2019 with Dr. Gala Romney, demonstrated multiple polyps with tubular adenoma and was referred for surgical excision of his colon due to volume and concern for extensive disease.  He was lost to follow-up.   Followed up with GI with reports of dark stools, and subsequently had colonoscopy few weeks ago 07/2021 by Dr. Abbey Chatters with pathology showing fragments of tubulovillous adenoma., but with large amounts of tissue and mass remaining.  CT abdomen and pelvis showed a mass in the colon concerning for dilated pancreatic duct likely from pancreatitis, but recommended MRI to ensure no pancreatic mass. MRI done 07/2021 was moderately motion degraded, showed upstream pancreatic atrophy and duct dilatation secondary to dominant ductal calculi, but given limitations, no convincing evidence of underlying neoplasm.  Review of Systems: As mentioned in the history of present illness. All other systems reviewed and are negative. Past Medical History:  Diagnosis Date   Aortic atherosclerosis ([place]) 08/03/2021   CAD (coronary artery disease) 08/03/2021   Nuclear stress test 10/19/2017: Inferior/septal myocardial infarction with mild peri-infarct ischemia, EF 33 Med Rx - no symptoms   Carotid artery disease (HCC)    Chronic diastolic heart failure (HCC)    CKD (chronic kidney disease) stage 3, GFR 30-59 ml/min (HCC)    COPD (chronic obstructive pulmonary disease) (HCC)    Dysrhythmia    Essential hypertension    Head and neck cancer ([place]) 2019   Right facial basal cell carcinoma s/p resection with right partial mastectomy and partal rhinectomy with skin graft (06/13/17)   Iron deficiency anemia    Urinary retention    Past Surgical History:  Procedure Laterality Date   ANKLE CLOSED REDUCTION Right    open reduction   BASAL CELL CARCINOMA EXCISION  2019   [facility]   BIOPSY  07/19/2021   Procedure: BIOPSY;  Surgeon: Eloise Harman, DO;  [facility];  Service: Endoscopy;;   COLONOSCOPY N/A 05/31/2017   Procedure: COLONOSCOPY;  Surgeon: Daneil Dolin, MD;  [facility];  Service: Endoscopy;  Laterality: N/A;  2:45pm   COLONOSCOPY WITH PROPOFOL N/A 07/19/2021   Procedure: COLONOSCOPY WITH  PROPOFOL;  Surgeon: Eloise Harman, DO;  [facility];  Service: Endoscopy;  Laterality: N/A;  3:00pm, moved up to 9:00   CYSTOSCOPY N/A 10/29/2018   Procedure: CYSTOSCOPY, CLOT EVACUATION;  Surgeon: Ceasar Mons, MD;  [facility];  Service: Urology;  Laterality: N/A;   POLYPECTOMY  05/31/2017   Procedure: POLYPECTOMY;  Surgeon: Daneil Dolin, MD;  [facility];  Service: Endoscopy;;   TONSILLECTOMY     TRANSCAROTID ARTERY REVASCULARIZATION  Right 02/15/2021   Procedure: RIGHT TRANSCAROTID ARTERY REVASCULARIZATION;  Surgeon: [name] Heck, MD;  Location: [place];  Service: Vascular;  Laterality: Right;   ULTRASOUND GUIDANCE FOR VASCULAR ACCESS Left 02/15/2021   Procedure: ULTRASOUND GUIDANCE FOR VASCULAR ACCESS;  Surgeon: [name] Heck, MD;  Location: [place];  Service: Vascular;  Laterality: Left;   XI ROBOTIC ASSISTED SIMPLE PROSTATECTOMY N/A 10/29/2018   Procedure: XI ROBOTIC ASSISTED SIMPLE PROSTATECTOMY;  Surgeon: Cleon Gustin, MD;  [facility];  Service: Urology;  Laterality: N/A;   Social History:  reports that he quit smoking about 5 weeks ago. His smoking use included cigarettes. He has a 20.00 pack-year smoking history. He has never been exposed to tobacco smoke. He has never used smokeless tobacco. He reports that he does not currently use alcohol. He reports that he does not use drugs.  No Known Allergies  Family History  Problem Relation Age of Onset   Stroke Father    Cirrhosis Mother    Colon cancer Neg Hx     Prior to Admission medications   Medication Sig Start Date End Date Taking? Authorizing Provider  aspirin EC 81 MG tablet Take 81 mg by mouth daily.   Yes [provider]  atorvastatin (LIPITOR) 80 MG tablet Take 1 tablet (80 mg total) by mouth every evening. 08/10/21  Yes Swinyer, Lanice Schwab, NP  clopidogrel (PLAVIX) 75 MG tablet Take 1 tablet (75 mg total) by mouth daily. 05/19/21  Yes Satira Sark, MD  Evolocumab (REPATHA SURECLICK) 824 MG/ML SOAJ Inject 1 pen into the skin every 14 (fourteen) days. 03/09/21  Yes Satira Sark, MD  isosorbide mononitrate (IMDUR) 30 MG 24 hr tablet Take 1 tablet (30 mg total) by mouth every evening. 08/10/21  Yes Swinyer, Lanice Schwab, NP  metoprolol succinate (TOPROL-XL) 25 MG 24 hr tablet Take 1 tablet (25 mg total) by mouth every evening. 08/10/21  Yes Swinyer, Lanice Schwab, NP  potassium chloride (KLOR-CON) 10 MEQ tablet Take 1 tablet (10 mEq total) by mouth daily. 08/10/21  Yes Swinyer, Lanice Schwab, NP  trimethoprim (TRIMPEX) 100 MG tablet Take 1 tablet (100 mg total) by mouth at bedtime. 06/23/21  Yes McKenzie, Candee Furbish, MD  ergocalciferol (VITAMIN D2) 1.25 MG (50000 UT) capsule Take 1 capsule (50,000 Units total) by mouth once a week. Patient not taking: Reported on 07/09/2021 04/26/21   Harriett Rush, PA-C    Physical Exam: Vitals:   08/11/21 1545 08/11/21 1554 08/11/21 1600 08/11/21 1615  BP: (!) 153/76  (!) 148/83 (!) 154/79  Pulse: 64 63 63 63  Resp: 18 19 (!) 8   Temp:      TempSrc:      SpO2: 100% 96%  94% 94%    Constitutional: NAD, calm, comfortable, on nasal canula  Eyes: PERRL, lids and conjunctivae normal ENMT: Mucous membranes are moist.  Has 1 nare on his face , from prior rhinoplasty done with skin graft for basal cell cancer 2019. Neck: normal, supple, no masses, no thyromegaly Respiratory: clear to auscultation bilaterally, no wheezing, no crackles. Normal respiratory effort. No accessory muscle use.  Cardiovascular: Regular rate and rhythm, no murmurs / rubs / gallops. No extremity edema.  Lower extremities warm.   Abdomen: Abdominal binder in place clean.   Musculoskeletal: no clubbing / cyanosis. No joint deformity upper and lower extremities.  Skin: no rashes, lesions, ulcers. No induration Neurologic: No apparent cranial nerve abnormality moving extremities spontaneously Psychiatric: Normal judgment and  insight. Alert and oriented x 3. Normal mood.   Data Reviewed:   7/10, hemoglobin A1c 5.2, potassium 3.3 creatinine improved compared to prior at 1.6  Primary team communication:  Thank you very much for involving [place] in the care of your patient.  We will follow along with you.  Author: Bethena Roys, MD 08/11/2021 6:53 PM  For on call review [email or web address].

## 2021-08-11 NOTE — Anesthesia Preprocedure Evaluation (Signed)
Anesthesia Evaluation  Patient identified by MRN, date of birth, ID band Patient awake    Reviewed: Allergy & Precautions, NPO status , Patient's Chart, lab work & pertinent test results, reviewed documented beta blocker date and time   Airway Mallampati: III  TM Distance: >3 FB Neck ROM: Full  Mouth opening: Limited Mouth Opening Comment: Head and neck cancer ([place]) 2019 Right facial basal cell carcinoma s/p resection with right partial mastectomy and partal rhinectomy with skin graft (06/13/17)  Dental  (+) Missing, Loose,    Pulmonary pneumonia, COPD,  COPD inhaler, Patient abstained from smoking., former smoker,  Head and neck cancer ([place]) 2019 Right facial basal cell carcinoma s/p resection with right partial mastectomy and partal rhinectomy with skin graft (06/13/17)    Pulmonary exam normal breath sounds clear to auscultation       Cardiovascular Exercise Tolerance: Good hypertension, Pt. on home beta blockers and Pt. on medications + CAD, + Peripheral Vascular Disease (carotid revascularization) and +CHF  + dysrhythmias + Valvular Problems/Murmurs  Rhythm:Irregular Rate:Normal  2019- stress test Narrative & Impression Findings consistent with prior inferior/septal myocardial infarction with mild peri-infarct ischemia. This is a high risk study. Risk primarily based on decreased LVEF. There is a prior infarct with only mild current ischemia. Consider correlating LVEF with echocardiogram. The left ventricular ejection fraction is moderately decreased (33%).   16-Jul-2021 09:12:26 [facility]-AP-300 ROUTINE RECORD [phone] (60 yr) Male Caucasian Vent. rate 79 BPM PR interval 276 ms QRS duration 110 ms QT/QTcB 424/486 ms P-R-T axes 88 -34 141 Sinus rhythm with 1st degree A-V block with Premature atrial complexes Left axis deviation Left ventricular hypertrophy with repolarization abnormality ( Sokolow-Lyon  ) Cannot rule out Septal infarct , age undetermined Abnormal ECG Since last tracing Premature ventricular complexes NO LONGER PRESENT Confirmed by Skeet Latch [phone]) on 07/18/2021 2:09:20 PM Referred by: Jani Gravel Confirmed By: Skeet Latch    Neuro/Psych negative neurological ROS  negative psych ROS   GI/Hepatic negative GI ROS, Neg liver ROS,   Endo/Other  negative endocrine ROS  Renal/GU CRFRenal disease  negative genitourinary   Musculoskeletal negative musculoskeletal ROS (+)   Abdominal   Peds negative pediatric ROS (+)  Hematology negative hematology ROS (+) anemia ,   Anesthesia Other Findings 2019 - Study Conclusions   - Left ventricle: The cavity size was normal. Wall thickness was  increased in a pattern of mild LVH. Systolic function was mildly  reduced. The estimated ejection fraction was in the range of 45%  to 50%. There is hypokinesis of the basal-midinferolateral  myocardium. Features are consistent with a pseudonormal left  ventricular filling pattern, with concomitant abnormal relaxation  and increased filling pressure (grade 2 diastolic dysfunction).  - Aortic valve: Mildly calcified annulus. Trileaflet; mildly  calcified leaflets.  - Mitral valve: Mildly calcified annulus. There was mild  regurgitation.  - Left atrium: The atrium was mildly dilated.  - Tricuspid valve: There was trivial regurgitation.  - Pulmonary arteries: Systolic pressure could not be accurately  estimated.  - Pericardium, extracardiac: A trivial pericardial effusion was  identified anterior to the heart.   Reproductive/Obstetrics negative OB ROS                             Anesthesia Physical  Anesthesia Plan  ASA: 3  Anesthesia Plan: General   Post-op Pain Management:    Induction: Intravenous  PONV Risk Score and Plan: 1  Airway Management Planned: Oral ETT  Additional Equipment:   Intra-op Plan:    Post-operative Plan: Extubation in OR  Informed Consent: I have reviewed the patients History and Physical, chart, labs and discussed the procedure including the risks, benefits and alternatives for the proposed anesthesia with the patient or authorized representative who has indicated his/her understanding and acceptance.     Dental advisory given  Plan Discussed with: CRNA  Anesthesia Plan Comments:         Anesthesia Quick Evaluation

## 2021-08-11 NOTE — Progress Notes (Addendum)
[facility]  Right hemicolectomy completed.  No family to call per patient. PRN for pain IS, OOB HTN at baseline and poorly controlled, will get hospitalist to help make sure we have good control post op and with other medical issues Clear diet Michael [name] in AM Foley taken out in the OR LR @ 75 SCDs, heparin sq  Curlene Labrum, MD [facility] [name], [place] [address] [phone] (office)

## 2021-08-11 NOTE — Assessment & Plan Note (Signed)
Blood pressure elevated mostly ranging from 599H to 741 systolic. -Resume home meds Imdur, metoprolol -Monitor for now if adjustments of home med is needed -As needed hydralazine for systolic greater than 423.

## 2021-08-12 ENCOUNTER — Other Ambulatory Visit: Payer: Self-pay

## 2021-08-12 ENCOUNTER — Encounter (HOSPITAL_COMMUNITY): Payer: Self-pay | Admitting: General Surgery

## 2021-08-12 DIAGNOSIS — K6389 Other specified diseases of intestine: Secondary | ICD-10-CM | POA: Diagnosis not present

## 2021-08-12 LAB — BASIC METABOLIC PANEL
Anion gap: 10 (ref 5–15)
BUN: 27 mg/dL — ABNORMAL HIGH (ref 8–23)
CO2: 23 mmol/L (ref 22–32)
Calcium: 8.5 mg/dL — ABNORMAL LOW (ref 8.9–10.3)
Chloride: 108 mmol/L (ref 98–111)
Creatinine, Ser: 2.02 mg/dL — ABNORMAL HIGH (ref 0.61–1.24)
GFR, Estimated: 35 mL/min — ABNORMAL LOW (ref >60)
Glucose, Bld: 156 mg/dL — ABNORMAL HIGH (ref 70–99)
Potassium: 3.5 mmol/L (ref 3.5–5.1)
Sodium: 141 mmol/L (ref 135–145)

## 2021-08-12 LAB — CBC
HCT: 34.3 % — ABNORMAL LOW (ref 39.0–52.0)
Hemoglobin: 10.3 g/dL — ABNORMAL LOW (ref 13.0–17.0)
MCH: 24.7 pg — ABNORMAL LOW (ref 26.0–34.0)
MCHC: 30 g/dL (ref 30.0–36.0)
MCV: 82.3 fL (ref 80.0–100.0)
Platelets: 350 10*3/uL (ref 150–400)
RBC: 4.17 MIL/uL — ABNORMAL LOW (ref 4.22–5.81)
RDW: 14.4 % (ref 11.5–15.5)
WBC: 10.3 10*3/uL (ref 4.0–10.5)
nRBC: 0 % (ref 0.0–0.2)

## 2021-08-12 LAB — PHOSPHORUS: Phosphorus: 4.6 mg/dL (ref 2.5–4.6)

## 2021-08-12 LAB — MAGNESIUM: Magnesium: 2 mg/dL (ref 1.7–2.4)

## 2021-08-12 MED ORDER — POTASSIUM CHLORIDE 20 MEQ PO PACK
60.0000 meq | PACK | Freq: Once | ORAL | Status: AC
Start: 2021-08-12 — End: 2021-08-12
  Administered 2021-08-12: 60 meq via ORAL
  Filled 2021-08-12: qty 3

## 2021-08-12 NOTE — Progress Notes (Signed)
  Transition of Care [facility]) Screening Note   Patient Details  Name: Michael [name] Date of Birth: [birth year]   Transition of Care [facility]) CM/SW Contact:    Ihor Gully, LCSW Phone Number: 08/12/2021, 9:20 AM    Transition of Care Department [facility]) has reviewed patient and no TOC needs have been identified at this time. We will continue to monitor patient advancement through interdisciplinary progression rounds. If new patient transition needs arise, please place a TOC consult.

## 2021-08-12 NOTE — Plan of Care (Signed)
  Problem: Education: Goal: Understanding of discharge needs will improve Outcome: Progressing Goal: Verbalization of understanding of the causes of altered bowel function will improve Outcome: Progressing   Problem: Activity: Goal: Ability to tolerate increased activity will improve Outcome: Progressing   Problem: Bowel/Gastric: Goal: Gastrointestinal status for postoperative course will improve Outcome: Progressing   Problem: Health Behavior/Discharge Planning: Goal: Identification of community resources to assist with postoperative recovery needs will improve Outcome: Progressing   Problem: Nutritional: Goal: Will attain and maintain optimal nutritional status will improve Outcome: Progressing   Problem: Clinical Measurements: Goal: Postoperative complications will be avoided or minimized Outcome: Progressing   Problem: Respiratory: Goal: Respiratory status will improve Outcome: Progressing   Problem: Skin Integrity: Goal: Will show signs of wound healing Outcome: Progressing   Problem: Education: Goal: Knowledge of General Education information will improve Description: Including pain rating scale, medication(s)/side effects and non-pharmacologic comfort measures Outcome: Progressing   Problem: Health Behavior/Discharge Planning: Goal: Ability to manage health-related needs will improve Outcome: Progressing   Problem: Clinical Measurements: Goal: Ability to maintain clinical measurements within normal limits will improve Outcome: Progressing Goal: Will remain free from infection Outcome: Progressing Goal: Diagnostic test results will improve Outcome: Progressing Goal: Respiratory complications will improve Outcome: Progressing Goal: Cardiovascular complication will be avoided Outcome: Progressing   Problem: Activity: Goal: Risk for activity intolerance will decrease Outcome: Progressing   Problem: Nutrition: Goal: Adequate nutrition will be  maintained Outcome: Progressing   Problem: Coping: Goal: Level of anxiety will decrease Outcome: Progressing   Problem: Elimination: Goal: Will not experience complications related to bowel motility Outcome: Progressing Goal: Will not experience complications related to urinary retention Outcome: Progressing   Problem: Pain Managment: Goal: General experience of comfort will improve Outcome: Progressing   Problem: Safety: Goal: Ability to remain free from injury will improve Outcome: Progressing   Problem: Skin Integrity: Goal: Risk for impaired skin integrity will decrease Outcome: Progressing   

## 2021-08-12 NOTE — Care Management Important Message (Signed)
Important Message  Patient Details  Name: Michael [name] MRN: [id number] Date of Birth: 13-Jul-1955   Medicare Important Message Given:  Yes     Tommy Medal 08/12/2021, 4:02 PM

## 2021-08-12 NOTE — Progress Notes (Signed)
PROGRESS NOTE    Michael [name]  JSH:[id number] DOB: Jul 08, 1955 DOA: 08/11/2021 PCP: Jani Gravel, MD   Brief Narrative:    Michael [name] is a 66 y.o. male with past medical history of hypertension, COPD, chronic diastolic congestive heart failure, coronary artery disease. Patient was admitted to the hospital for open right hemicolectomy for colon mass.  Patient tolerated procedure well.  He has no complaints at the time of my evaluation.  Hospitalist consulted for management of his blood pressure.  Assessment & Plan:   Principal Problem:   Colonic mass Active Problems:   Essential hypertension   Stage 3a chronic kidney disease (HCC)   Dilation of pancreatic duct  Assessment and Plan:   Colonic mass Status post right hemicolectomy by Dr. Constance Haw 7/12.   -Further ongoing management per general surgery -Currently on clear liquid diet and IV fluid     Dilation of pancreatic duct As seen on follow-up MRI.  Found to have pancreatic duct stones..  Dr. Constance Haw note, he is asymptomatic, this should be evaluated further and treatment needs determined after colectomy.   Stage 3a chronic kidney disease (HCC) Continue to monitor as appropriate, near baseline at this time   Essential hypertension-controlled Blood pressure elevated mostly ranging from 850Y to 774 systolic. -Resume home meds Imdur, metoprolol; has issues with compliance at home -Continue medications as ordered -As needed hydralazine for systolic greater than 128 -We will sign off for now unless blood pressures become uncontrolled, then please call us back as needed    DVT prophylaxis: Heparin Code Status: Full Family Communication: None at bedside Disposition Plan:  Status is: Inpatient Remains inpatient appropriate because: IV medications.   Antimicrobials:  Anti-infectives (From admission, onward)    Start     Dose/Rate Route Frequency Ordered Stop   08/12/21 0200  cefoTEtan (CEFOTAN) 2 g in sodium chloride 0.9  % 100 mL IVPB        2 g 200 mL/hr over 30 Minutes Intravenous Every 12 hours 08/11/21 1731 08/13/21 1359   08/11/21 0930  cefoTEtan (CEFOTAN) 2 g in sodium chloride 0.9 % 100 mL IVPB        2 g 200 mL/hr over 30 Minutes Intravenous On call to O.R. 08/11/21 7867 08/11/21 1402   08/11/21 0924  sodium chloride 0.9 % with cefoTEtan (CEFOTAN) ADS Med       Note to Pharmacy: Richarda Osmond L: cabinet override      08/11/21 0924 08/11/21 2129       Subjective: Patient seen and evaluated today with no new acute complaints or concerns. No acute concerns or events noted overnight.  Pain is improved today with no bowel movements or flatus noted yet.  He is tolerating diet with no nausea or vomiting.  He states that he is hungry and would like his diet advanced if possible.  Objective: Vitals:   08/11/21 1735 08/11/21 2028 08/12/21 0357 08/12/21 0500  BP: (!) 177/93 (!) 147/96 (!) 147/96   Pulse: 70 70 70   Resp: '16 18 18   '$ Temp: 98.1 F (36.7 C) 98.8 F (37.1 C) 98.8 F (37.1 C)   TempSrc: Oral  Oral   SpO2: 93% 97%    Weight:   74.4 kg 73.9 kg  Height:   '6\' 1"'$  (1.854 m)     Intake/Output Summary (Last 24 hours) at 08/12/2021 1055 Last data filed at 08/12/2021 0324 Gross per 24 hour  Intake 2112.28 ml  Output 150 ml  Net 1962.28 ml  Filed Weights   08/12/21 0357 08/12/21 0500  Weight: 74.4 kg 73.9 kg    Examination:  General exam: Appears calm and comfortable  Respiratory system: Clear to auscultation. Respiratory effort normal. Cardiovascular system: S1 & S2 heard, RRR.  Gastrointestinal system: Abdomen with binder in place Central nervous system: Alert and awake Extremities: No edema Skin: No significant lesions noted Psychiatry: Flat affect.    Data Reviewed: I have personally reviewed following labs and imaging studies  CBC: Recent Labs  Lab 08/09/21 0852 08/11/21 1908 08/12/21 0506  WBC 7.8 12.3* 10.3  NEUTROABS 4.9  --   --   HGB 12.2* 10.7* 10.3*  HCT  41.1 35.0* 34.3*  MCV 82.2 82.2 82.3  PLT 418* 314 588   Basic Metabolic Panel: Recent Labs  Lab 08/09/21 0852 08/11/21 1908 08/12/21 0506  NA 140 141 141  K 3.3* 3.5 3.5  CL 109 109 108  CO2 '26 24 23  '$ GLUCOSE 105* 142* 156*  BUN 20 23 27*  CREATININE 1.65* 1.94* 2.02*  CALCIUM 8.8* 8.4* 8.5*  MG  --   --  2.0  PHOS  --   --  4.6   GFR: Estimated Creatinine Clearance: 37.1 mL/min (A) (by C-G formula based on SCr of 2.02 mg/dL (H)). Liver Function Tests: No results for input(s): "AST", "ALT", "ALKPHOS", "BILITOT", "PROT", "ALBUMIN" in the last 168 hours. No results for input(s): "LIPASE", "AMYLASE" in the last 168 hours. No results for input(s): "AMMONIA" in the last 168 hours. Coagulation Profile: No results for input(s): "INR", "PROTIME" in the last 168 hours. Cardiac Enzymes: No results for input(s): "CKTOTAL", "CKMB", "CKMBINDEX", "TROPONINI" in the last 168 hours. BNP (last 3 results) No results for input(s): "PROBNP" in the last 8760 hours. HbA1C: No results for input(s): "HGBA1C" in the last 72 hours. CBG: No results for input(s): "GLUCAP" in the last 168 hours. Lipid Profile: No results for input(s): "CHOL", "HDL", "LDLCALC", "TRIG", "CHOLHDL", "LDLDIRECT" in the last 72 hours. Thyroid Function Tests: No results for input(s): "TSH", "T4TOTAL", "FREET4", "T3FREE", "THYROIDAB" in the last 72 hours. Anemia Panel: No results for input(s): "VITAMINB12", "FOLATE", "FERRITIN", "TIBC", "IRON", "RETICCTPCT" in the last 72 hours. Sepsis Labs: No results for input(s): "PROCALCITON", "LATICACIDVEN" in the last 168 hours.  No results found for this or any previous visit (from the past 240 hour(s)).       Radiology Studies: No results found.      Scheduled Meds:  acetaminophen  1,000 mg Oral Q6H   alvimopan  12 mg Oral BID   aspirin EC  81 mg Oral Daily   atorvastatin  80 mg Oral QPM   feeding supplement  237 mL Oral BID BM   heparin injection (subcutaneous)   5,000 Units Subcutaneous Q8H   isosorbide mononitrate  30 mg Oral QPM   metoprolol succinate  25 mg Oral QPM   saccharomyces boulardii  250 mg Oral BID   Continuous Infusions:  cefoTEtan (CEFOTAN) IV Stopped (08/12/21 0315)   lactated ringers 100 mL/hr at 08/12/21 0324     LOS: 1 day    Time spent: 35 minutes    Carollyn Etcheverry D Manuella Ghazi, DO [facility]  If 7PM-7AM, please contact night-coverage [email or web address] 08/12/2021, 10:55 AM

## 2021-08-12 NOTE — Progress Notes (Addendum)
I was present with the medical student for this service. I personally verified the history of present illness, performed the physical exam, and made the plan for this encounter. I have verified the medical student's documentation and made modifications where appropriately. I have personally documented in my own words a brief history, physical, and plan below.     Looks good. Midline with some staining on honyecomb. Awaiting BM. lytes replaced. HTN better.  Appreciate hospitalist.   Curlene Labrum, MD [facility] [place], [place] [address] [phone] (office)  [facility] Progress Note  1 Day Post-Op  Subjective: Patient is doing well. Patient says he experienced some pain after operation yesterday, but pain has improved today. Reports no bowel movements or flatus yet. No issues with urination. Denies fever, chills, nausea, or vomiting.  Objective: Vital signs in last 24 hours: Temp:  [97.4 F (36.3 C)-98.8 F (37.1 C)] 98.8 F (37.1 C) (07/13 0357) Pulse Rate:  [63-89] 70 (07/13 0357) Resp:  [8-23] 18 (07/13 0357) BP: (133-201)/(61-102) 147/96 (07/13 0357) SpO2:  [93 %-100 %] 97 % (07/12 2028) Weight:  [73.9 kg-74.4 kg] 73.9 kg (07/13 0500) Last BM Date : 08/11/21  Intake/Output from previous day: 07/12 0701 - 07/13 0700 In: 2112.3 [I.V.:1912.3; IV Piggyback:200] Out: 150 [Urine:125; Blood:25] Intake/Output this shift: No intake/output data recorded.  General appearance: Alert, oriented, cooperative, no acute distress Resp: CTAB, regular work of breathing Cardio: Normal rate, regular rhythm, no murmurs GI: Non-distended, no tenderness to palpation Skin: Warm, dry  Lab Results:  Recent Labs    08/11/21 1908 08/12/21 0506  WBC 12.3* 10.3  HGB 10.7* 10.3*  HCT 35.0* 34.3*  PLT 314 350   BMET Recent Labs    08/11/21 1908 08/12/21 0506  NA 141 141  K 3.5 3.5  CL 109 108  CO2 24 23  GLUCOSE  142* 156*  BUN 23 27*  CREATININE 1.94* 2.02*  CALCIUM 8.4* 8.5*   PT/INR No results for input(s): "LABPROT", "INR" in the last 72 hours.  Studies/Results: No results found.  Anti-infectives: Anti-infectives (From admission, onward)    Start     Dose/Rate Route Frequency Ordered Stop   08/12/21 0200  cefoTEtan (CEFOTAN) 2 g in sodium chloride 0.9 % 100 mL IVPB        2 g 200 mL/hr over 30 Minutes Intravenous Every 12 hours 08/11/21 1731 08/13/21 1359   08/11/21 0930  cefoTEtan (CEFOTAN) 2 g in sodium chloride 0.9 % 100 mL IVPB        2 g 200 mL/hr over 30 Minutes Intravenous On call to O.R. 08/11/21 [id number] 08/11/21 1402   08/11/21 0924  sodium chloride 0.9 % with cefoTEtan (CEFOTAN) ADS Med       Note to Pharmacy: Richarda Osmond L: cabinet override      08/11/21 0924 08/11/21 2129       Assessment/Plan: s/p Procedure(s): POD 1 PARTIAL COLECTOMY, OPEN RIGHT HEMICOLECTOMY  Patient is doing well s/p partial colectomy, open right hemicolectomy. No bowel movement or flatus yet.  - Prn for pain - Continue patient's home medications and HTN control - Liquid diet, IV LR fluids, feeding supplement - Patient displays regular work of breathing on RA   LOS: 1 day   Pirapat (Max) Rerkpattanapipat, Medical Student 08/12/2021

## 2021-08-12 NOTE — Care Management Important Message (Signed)
Important Message  Patient Details  Name: Michael [name] MRN: [id number] Date of Birth: [birth year]   Medicare Important Message Given:  N/A - LOS <3 / Initial given by admissions     Tommy Medal 08/12/2021, 2:46 PM

## 2021-08-12 NOTE — Progress Notes (Signed)
Patient gets his medications through [facility]. He has no difficulty obtaining his medications.   Nehemyah Foushee, Clydene Pugh, LCSW

## 2021-08-13 ENCOUNTER — Inpatient Hospital Stay (HOSPITAL_COMMUNITY): Payer: Medicare Other

## 2021-08-13 LAB — CBC WITH DIFFERENTIAL/PLATELET
Abs Immature Granulocytes: 0.11 10*3/uL — ABNORMAL HIGH (ref 0.00–0.07)
Basophils Absolute: 0.1 10*3/uL (ref 0.0–0.1)
Basophils Relative: 0 %
Eosinophils Absolute: 0.1 10*3/uL (ref 0.0–0.5)
Eosinophils Relative: 0 %
HCT: 35.1 % — ABNORMAL LOW (ref 39.0–52.0)
Hemoglobin: 10.5 g/dL — ABNORMAL LOW (ref 13.0–17.0)
Immature Granulocytes: 1 %
Lymphocytes Relative: 14 %
Lymphs Abs: 2.6 10*3/uL (ref 0.7–4.0)
MCH: 24.9 pg — ABNORMAL LOW (ref 26.0–34.0)
MCHC: 29.9 g/dL — ABNORMAL LOW (ref 30.0–36.0)
MCV: 83.4 fL (ref 80.0–100.0)
Monocytes Absolute: 1.1 10*3/uL — ABNORMAL HIGH (ref 0.1–1.0)
Monocytes Relative: 6 %
Neutro Abs: 14.4 10*3/uL — ABNORMAL HIGH (ref 1.7–7.7)
Neutrophils Relative %: 79 %
Platelets: 410 10*3/uL — ABNORMAL HIGH (ref 150–400)
RBC: 4.21 MIL/uL — ABNORMAL LOW (ref 4.22–5.81)
RDW: 14.5 % (ref 11.5–15.5)
WBC: 18.3 10*3/uL — ABNORMAL HIGH (ref 4.0–10.5)
nRBC: 0 % (ref 0.0–0.2)

## 2021-08-13 LAB — BASIC METABOLIC PANEL
Anion gap: 10 (ref 5–15)
BUN: 31 mg/dL — ABNORMAL HIGH (ref 8–23)
CO2: 23 mmol/L (ref 22–32)
Calcium: 8.7 mg/dL — ABNORMAL LOW (ref 8.9–10.3)
Chloride: 108 mmol/L (ref 98–111)
Creatinine, Ser: 1.91 mg/dL — ABNORMAL HIGH (ref 0.61–1.24)
GFR, Estimated: 38 mL/min — ABNORMAL LOW (ref >60)
Glucose, Bld: 111 mg/dL — ABNORMAL HIGH (ref 70–99)
Potassium: 3.7 mmol/L (ref 3.5–5.1)
Sodium: 141 mmol/L (ref 135–145)

## 2021-08-13 LAB — MAGNESIUM: Magnesium: 2.2 mg/dL (ref 1.7–2.4)

## 2021-08-13 LAB — PHOSPHORUS: Phosphorus: 2.9 mg/dL (ref 2.5–4.6)

## 2021-08-13 MED ORDER — DOCUSATE SODIUM 100 MG PO CAPS
100.0000 mg | ORAL_CAPSULE | Freq: Two times a day (BID) | ORAL | Status: DC
  Administered 2021-08-13 (×2): 100 mg via ORAL
  Filled 2021-08-13 (×3): qty 1

## 2021-08-13 MED ORDER — IPRATROPIUM-ALBUTEROL 0.5-2.5 (3) MG/3ML IN SOLN
3.0000 mL | Freq: Four times a day (QID) | RESPIRATORY_TRACT | Status: DC | PRN
  Administered 2021-08-13 – 2021-08-14 (×2): 3 mL via RESPIRATORY_TRACT
  Filled 2021-08-13 (×2): qty 3

## 2021-08-13 MED ORDER — IPRATROPIUM-ALBUTEROL 0.5-2.5 (3) MG/3ML IN SOLN
RESPIRATORY_TRACT | Status: AC
  Administered 2021-08-13: 3 mL
  Filled 2021-08-13: qty 3

## 2021-08-13 NOTE — Progress Notes (Signed)
This nurse was called to pt's room with c/o SOB. O2 sat was 94% on RA. Offered pt to put his Nasal canula on but pt refused and would rather wait on respiratory. No signs of distress. Called respiratory for PRN neb treatment. Will continue to monitor pt.

## 2021-08-13 NOTE — Discharge Instructions (Signed)
Discharge Open Abdominal Surgery Instructions:  Common Complaints: Pain at the incision site is common. This will improve with time. Take your pain medications as described below. Some nausea is common and poor appetite. The main goal is to stay hydrated the first few days after surgery.   Diet/ Activity: Diet as tolerated. You have started and tolerated a diet in the hospital, and should continue to increase what you are able to eat.   You may not have a large appetite, but it is important to stay hydrated. Drink 64 ounces of water a day. Your appetite will return with time.  Keep a dry dressing in place over your staples daily or as needed. Some minor pink/ blood tinged drainage is expected. This will stop in a few days after surgery.  Shower per your regular routine daily.  Do not take hot showers. Take warm showers that are less than 10 minutes. Path the incision dry. Wear an abdominal binder daily with activity. You do not have to wear this while sleeping or sitting.  Rest and listen to your body, but do not remain in bed all day.  Walk everyday for at least 15-20 minutes. Deep cough and move around every 1-2 hours in the first few days after surgery.  Do not lift > 10 lbs, perform excessive bending, pushing, pulling, squatting for 6-8 weeks after surgery.  The activity restrictions and the abdominal binder are to prevent hernia formation at your incision while you are healing.  Do not place lotions or balms on your incision unless instructed to specifically by Dr. Bridges.   Pain Expectations and Narcotics: -After surgery you will have pain associated with your incisions and this is normal. The pain is muscular and nerve pain, and will get better with time. -You are encouraged and expected to take non narcotic medications like tylenol and ibuprofen (when able) to treat pain as multiple modalities can aid with pain treatment. -Narcotics are only used when pain is severe or there is  breakthrough pain. -You are not expected to have a pain score of 0 after surgery, as we cannot prevent pain. A pain score of 3-4 that allows you to be functional, move, walk, and tolerate some activity is the goal. The pain will continue to improve over the days after surgery and is dependent on your surgery. -Due to [place] law, we are only able to give a certain amount of pain medication to treat post operative pain, and we only give additional narcotics on a patient by patient basis.  -For most laparoscopic surgery, studies have shown that the majority of patients only need 10-15 narcotic pills, and for open surgeries most patients only need 15-20.   -Having appropriate expectations of pain and knowledge of pain management with non narcotics is important as we do not want anyone to become addicted to narcotic pain medication.  -Using ice packs in the first 48 hours and heating pads after 48 hours, wearing an abdominal binder (when recommended), and using over the counter medications are all ways to help with pain management.   -Simple acts like meditation and mindfulness practices after surgery can also help with pain control and research has proven the benefit of these practices.  Medication: Take tylenol and ibuprofen as needed for pain control, alternating every 4-6 hours.  Example:  Tylenol 1000mg @ 6am, 12noon, 6pm, 12midnight (Do not exceed 4000mg of tylenol a day). Ibuprofen 800mg @ 9am, 3pm, 9pm, 3am (Do not exceed 3600mg of ibuprofen a day).    Take Roxicodone for breakthrough pain every 4 hours.  Take Colace for constipation related to narcotic pain medication. If you do not have a bowel movement in 2 days, take Miralax over the counter.  Drink plenty of water to also prevent constipation. If you notice increasing bruising around your incision site, hold your Plavix   Contact Information: If you have questions or concerns, please call our office, [phone], Monday- Thursday 8AM-5PM and  Friday 8AM-12Noon.  If it is after hours or on the weekend, please call [facility] Main Number, [phone], [phone], and ask to speak to the surgeon on call for Dr. Constance Haw at [facility].

## 2021-08-13 NOTE — Plan of Care (Signed)
  Problem: Education: Goal: Understanding of discharge needs will improve Outcome: Progressing Goal: Verbalization of understanding of the causes of altered bowel function will improve Outcome: Progressing   Problem: Activity: Goal: Ability to tolerate increased activity will improve Outcome: Progressing   Problem: Bowel/Gastric: Goal: Gastrointestinal status for postoperative course will improve Outcome: Progressing   Problem: Health Behavior/Discharge Planning: Goal: Identification of community resources to assist with postoperative recovery needs will improve Outcome: Progressing   Problem: Nutritional: Goal: Will attain and maintain optimal nutritional status will improve Outcome: Progressing   Problem: Clinical Measurements: Goal: Postoperative complications will be avoided or minimized Outcome: Progressing   Problem: Respiratory: Goal: Respiratory status will improve Outcome: Progressing   Problem: Skin Integrity: Goal: Will show signs of wound healing Outcome: Progressing   Problem: Education: Goal: Knowledge of General Education information will improve Description: Including pain rating scale, medication(s)/side effects and non-pharmacologic comfort measures Outcome: Progressing   Problem: Health Behavior/Discharge Planning: Goal: Ability to manage health-related needs will improve Outcome: Progressing   Problem: Clinical Measurements: Goal: Ability to maintain clinical measurements within normal limits will improve Outcome: Progressing Goal: Will remain free from infection Outcome: Progressing Goal: Diagnostic test results will improve Outcome: Progressing Goal: Respiratory complications will improve Outcome: Progressing Goal: Cardiovascular complication will be avoided Outcome: Progressing   Problem: Activity: Goal: Risk for activity intolerance will decrease Outcome: Progressing   Problem: Nutrition: Goal: Adequate nutrition will be  maintained Outcome: Progressing   Problem: Coping: Goal: Level of anxiety will decrease Outcome: Progressing   Problem: Elimination: Goal: Will not experience complications related to bowel motility Outcome: Progressing Goal: Will not experience complications related to urinary retention Outcome: Progressing   Problem: Pain Managment: Goal: General experience of comfort will improve Outcome: Progressing   Problem: Safety: Goal: Ability to remain free from injury will improve Outcome: Progressing   Problem: Skin Integrity: Goal: Risk for impaired skin integrity will decrease Outcome: Progressing   

## 2021-08-13 NOTE — Progress Notes (Signed)
[facility] Progress Note  2 Days Post-Op  Subjective: Feeling ok but did have SOB earlier in the AM. CXR looks ok with some atelectasis, post op free air. No abdominal pain, having flatus.   Objective: Vital signs in last 24 hours: Temp:  [97.9 F (36.6 C)-98.2 F (36.8 C)] 97.9 F (36.6 C) (07/14 0350) Pulse Rate:  [72-83] 78 (07/14 0350) Resp:  [18-20] 20 (07/14 0350) BP: (133-169)/(73-104) 156/80 (07/14 0350) SpO2:  [94 %-99 %] 94 % (07/14 0350) Weight:  [76.7 kg] 76.7 kg (07/14 0500) Last BM Date : 08/11/21  Intake/Output from previous day: 07/13 0701 - 07/14 0700 In: 3331.8 [P.O.:540; I.V.:2583.1; IV Piggyback:208.7] Out: 625 [Urine:625] Intake/Output this shift: Total I/O In: 480 [P.O.:480] Out: -   General appearance: alert and no distress Resp: normal work of breathing GI: soft, nondistended, appropriately tender, staples c/d/I with honeycomb, some minor staining  Lab Results:  Recent Labs    08/12/21 0506 08/13/21 0452  WBC 10.3 18.3*  HGB 10.3* 10.5*  HCT 34.3* 35.1*  PLT 350 410*   BMET Recent Labs    08/12/21 0506 08/13/21 0452  NA 141 141  K 3.5 3.7  CL 108 108  CO2 23 23  GLUCOSE 156* 111*  BUN 27* 31*  CREATININE 2.02* 1.91*  CALCIUM 8.5* 8.7*   PT/INR No results for input(s): "LABPROT", "INR" in the last 72 hours.  Studies/Results: DG Chest Port 1 View  Result Date: 08/13/2021 CLINICAL DATA:  Chest pain; recent right hemicolectomy EXAM: PORTABLE CHEST 1 VIEW COMPARISON:  Chest x-ray dated May 09, 2018 FINDINGS: Cardiac and mediastinal contours are within normal limits. Mild left basilar opacity. Free air seen under the right hemidiaphragm. No large pleural effusion or pneumothorax. IMPRESSION: 1. Mild left basilar opacity, likely due to atelectasis. 2. Free air seen under the right hemidiaphragm, likely postoperative. Electronically Signed   By: Yetta Glassman M.D.   On: 08/13/2021 09:33     Anti-infectives: Anti-infectives (From admission, onward)    Start     Dose/Rate Route Frequency Ordered Stop   08/12/21 0200  cefoTEtan (CEFOTAN) 2 g in sodium chloride 0.9 % 100 mL IVPB        2 g 200 mL/hr over 30 Minutes Intravenous Every 12 hours 08/11/21 1731 08/13/21 0203   08/11/21 0930  cefoTEtan (CEFOTAN) 2 g in sodium chloride 0.9 % 100 mL IVPB        2 g 200 mL/hr over 30 Minutes Intravenous On call to O.R. 08/11/21 6606 08/11/21 1402   08/11/21 0924  sodium chloride 0.9 % with cefoTEtan (CEFOTAN) ADS Med       Note to Pharmacy: Richarda Osmond L: cabinet override      08/11/21 0924 08/11/21 2129       Assessment/Plan: Patient s/p right open colectomy. Doing well.  PRN for pain IS, OOB HD ok, HTN meds from home Hospitalist have signed off, but can be called if issues CXR without major issues  SOB, improved after Abd binder is removed Soft diet, colace added Labs tomorrow WBC up but likely post op, H&H stable  BMP, Cr at baseline SCDs, heparin sq   LOS: 2 days    Virl Cagey 08/13/2021

## 2021-08-13 NOTE — Progress Notes (Signed)
Pt. c/o SOB. His o2 sat is 93% on 2L. He's been at 99% on RA. He's having expiratory wheezing in the left and right lower lobes. Its very audible in the RML and RLL. MD notified. RRT Notified.

## 2021-08-14 LAB — BASIC METABOLIC PANEL
Anion gap: 8 (ref 5–15)
BUN: 26 mg/dL — ABNORMAL HIGH (ref 8–23)
CO2: 23 mmol/L (ref 22–32)
Calcium: 8.4 mg/dL — ABNORMAL LOW (ref 8.9–10.3)
Chloride: 110 mmol/L (ref 98–111)
Creatinine, Ser: 1.72 mg/dL — ABNORMAL HIGH (ref 0.61–1.24)
GFR, Estimated: 43 mL/min — ABNORMAL LOW (ref >60)
Glucose, Bld: 113 mg/dL — ABNORMAL HIGH (ref 70–99)
Potassium: 4.2 mmol/L (ref 3.5–5.1)
Sodium: 141 mmol/L (ref 135–145)

## 2021-08-14 LAB — CBC WITH DIFFERENTIAL/PLATELET
Abs Immature Granulocytes: 0.06 10*3/uL (ref 0.00–0.07)
Basophils Absolute: 0.1 10*3/uL (ref 0.0–0.1)
Basophils Relative: 1 %
Eosinophils Absolute: 0.1 10*3/uL (ref 0.0–0.5)
Eosinophils Relative: 1 %
HCT: 32.4 % — ABNORMAL LOW (ref 39.0–52.0)
Hemoglobin: 9.7 g/dL — ABNORMAL LOW (ref 13.0–17.0)
Immature Granulocytes: 1 %
Lymphocytes Relative: 13 %
Lymphs Abs: 1.2 10*3/uL (ref 0.7–4.0)
MCH: 24.5 pg — ABNORMAL LOW (ref 26.0–34.0)
MCHC: 29.9 g/dL — ABNORMAL LOW (ref 30.0–36.0)
MCV: 81.8 fL (ref 80.0–100.0)
Monocytes Absolute: 0.8 10*3/uL (ref 0.1–1.0)
Monocytes Relative: 9 %
Neutro Abs: 6.8 10*3/uL (ref 1.7–7.7)
Neutrophils Relative %: 75 %
Platelets: 291 10*3/uL (ref 150–400)
RBC: 3.96 MIL/uL — ABNORMAL LOW (ref 4.22–5.81)
RDW: 14.5 % (ref 11.5–15.5)
WBC: 9 10*3/uL (ref 4.0–10.5)
nRBC: 0 % (ref 0.0–0.2)

## 2021-08-14 MED ORDER — ACETAMINOPHEN 500 MG PO TABS: 1000.0000 mg | ORAL_TABLET | Freq: Four times a day (QID) | ORAL | 0 refills | Status: AC

## 2021-08-14 MED ORDER — DOCUSATE SODIUM 100 MG PO CAPS: 100.0000 mg | ORAL_CAPSULE | Freq: Two times a day (BID) | ORAL | 0 refills | Status: AC

## 2021-08-14 MED ORDER — ALBUTEROL SULFATE HFA 108 (90 BASE) MCG/ACT IN AERS
2.0000 | INHALATION_SPRAY | Freq: Four times a day (QID) | RESPIRATORY_TRACT | 2 refills | Status: DC | PRN
Start: 2021-08-14 — End: 2022-04-27

## 2021-08-14 MED ORDER — ONDANSETRON HCL 4 MG PO TABS: 4.0000 mg | ORAL_TABLET | Freq: Four times a day (QID) | ORAL | 0 refills | Status: DC | PRN

## 2021-08-14 MED ORDER — OXYCODONE HCL 5 MG PO TABS: 5.0000 mg | ORAL_TABLET | Freq: Four times a day (QID) | ORAL | 0 refills | Status: AC | PRN

## 2021-08-14 NOTE — Discharge Summary (Signed)
Physician Discharge Summary  Patient ID: Graiden Henes MRN: [id number] DOB/AGE: 1956-01-30 66 y.o.  Admit date: 08/11/2021 Discharge date: 08/14/2021  Admission Diagnoses:  Discharge Diagnoses:  Principal Problem:   Colonic mass Active Problems:   Essential hypertension   Stage 3a chronic kidney disease (HCC)   Dilation of pancreatic duct   Discharged Condition: stable  Hospital Course: Patient is a 66 year old male who was admitted status post open right hemicolectomy for a right colon mass.  Postoperatively, the patient progressed well.  He was able to tolerate a diet, urinate after Foley removal, ambulate in the halls, and move his bowels.  His pain is adequately controlled with oral pain medications.  He did require some DuoNeb treatments for wheezing overnight.  He denies any issues with breathing prior to hospitalization.  He is stable for discharge at this time.  He has been discharged home with prescriptions for narcotic pain medication, Zofran, albuterol inhaler to take as needed for wheezing/shortness of breath, scheduled Tylenol, and Colace.  He will follow-up with Dr. Arnoldo Morale in 2 weeks for staple removal, and has a subsequent follow-up visit with Dr. Constance Haw.  Consults:  Hospitalist with assistance in medical management  Significant Diagnostic Studies: None  Treatments: surgery: Open right hemicolectomy for right colon mass  Discharge Exam: Blood pressure 132/75, pulse 86, temperature 98.5 F (36.9 C), temperature source Oral, resp. rate 20, height '6\' 1"'$  (1.854 m), weight 76.1 kg, SpO2 92 %. General appearance: alert, cooperative, and no distress GI: Abdomen soft, nondistended, no percussion tenderness, minimal tenderness to palpation along the incision site; no rigidity, guarding, rebound tenderness; midline incision with skin staples in place, small amount of ecchymosis at the inferior left lateral aspect of the incision  Disposition: Discharge disposition: 01-Home or  Self Care       Discharge Instructions     Call MD for:  difficulty breathing, headache or visual disturbances   Complete by: As directed    Call MD for:  extreme fatigue   Complete by: As directed    Call MD for:  hives   Complete by: As directed    Call MD for:  persistant dizziness or light-headedness   Complete by: As directed    Call MD for:  persistant nausea and vomiting   Complete by: As directed    Call MD for:  redness, tenderness, or signs of infection (pain, swelling, redness, odor or green/yellow discharge around incision site)   Complete by: As directed    Call MD for:  severe uncontrolled pain   Complete by: As directed    Call MD for:  temperature >100.4   Complete by: As directed    Diet - low sodium heart healthy   Complete by: As directed    Increase activity slowly   Complete by: As directed       Allergies as of 08/14/2021   No Known Allergies      Medication List     TAKE these medications    acetaminophen 500 MG tablet Commonly known as: TYLENOL Take 2 tablets (1,000 mg total) by mouth every 6 (six) hours for 7 days.   albuterol 108 (90 Base) MCG/ACT inhaler Commonly known as: VENTOLIN HFA Inhale 2 puffs into the lungs every 6 (six) hours as needed for wheezing or shortness of breath.   aspirin EC 81 MG tablet Take 81 mg by mouth daily.   atorvastatin 80 MG tablet Commonly known as: LIPITOR Take 1 tablet (80 mg total) by mouth every  evening.   clopidogrel 75 MG tablet Commonly known as: PLAVIX Take 1 tablet (75 mg total) by mouth daily.   docusate sodium 100 MG capsule Commonly known as: COLACE Take 1 capsule (100 mg total) by mouth 2 (two) times daily for 5 days.   ergocalciferol 1.25 MG (50000 UT) capsule Commonly known as: VITAMIN D2 Take 1 capsule (50,000 Units total) by mouth once a week.   isosorbide mononitrate 30 MG 24 hr tablet Commonly known as: IMDUR Take 1 tablet (30 mg total) by mouth every evening.    metoprolol succinate 25 MG 24 hr tablet Commonly known as: TOPROL-XL Take 1 tablet (25 mg total) by mouth every evening.   ondansetron 4 MG tablet Commonly known as: ZOFRAN Take 1 tablet (4 mg total) by mouth every 6 (six) hours as needed for nausea.   oxyCODONE 5 MG immediate release tablet Commonly known as: Oxy IR/ROXICODONE Take 1-2 tablets (5-10 mg total) by mouth every 6 (six) hours as needed for up to 7 days for moderate pain or severe pain.   potassium chloride 10 MEQ tablet Commonly known as: KLOR-CON Take 1 tablet (10 mEq total) by mouth daily.   Repatha SureClick 676 MG/ML Soaj Generic drug: Evolocumab Inject 1 pen into the skin every 14 (fourteen) days.   trimethoprim 100 MG tablet Commonly known as: TRIMPEX Take 1 tablet (100 mg total) by mouth at bedtime.        Follow-up Information     Virl Cagey, MD Follow up on 09/15/2021.   Specialty: General Surgery Why: follow up post op Contact information: [address] [place] [address] [phone]         Aviva Signs, MD Follow up on 08/25/2021.   Specialty: General Surgery Why: staple removal post op colectomy Contact information: [address] [place] [address] [phone]                 Signed: Jeannett Dekoning A Maidie Streight 08/14/2021, 11:10 AM

## 2021-08-16 LAB — SURGICAL PATHOLOGY

## 2021-08-16 NOTE — Progress Notes (Signed)
No invasive cancer on the pathology, high grade dysplasia in the polyps. Called the patient and he answered and updated him. He is doing well and says eating ok and having Bms. Has a little soreness. Staples will be removed on 7/26 by Dr. Arnoldo Morale.

## 2021-08-19 ENCOUNTER — Inpatient Hospital Stay (HOSPITAL_COMMUNITY): Payer: Medicare Other | Attending: Hematology

## 2021-08-19 DIAGNOSIS — Z7982 Long term (current) use of aspirin: Secondary | ICD-10-CM | POA: Insufficient documentation

## 2021-08-19 DIAGNOSIS — Z87891 Personal history of nicotine dependence: Secondary | ICD-10-CM | POA: Diagnosis not present

## 2021-08-19 DIAGNOSIS — D509 Iron deficiency anemia, unspecified: Secondary | ICD-10-CM | POA: Diagnosis not present

## 2021-08-19 DIAGNOSIS — K909 Intestinal malabsorption, unspecified: Secondary | ICD-10-CM | POA: Diagnosis not present

## 2021-08-19 DIAGNOSIS — Z79899 Other long term (current) drug therapy: Secondary | ICD-10-CM | POA: Diagnosis not present

## 2021-08-19 DIAGNOSIS — Z7902 Long term (current) use of antithrombotics/antiplatelets: Secondary | ICD-10-CM | POA: Diagnosis not present

## 2021-08-19 DIAGNOSIS — D500 Iron deficiency anemia secondary to blood loss (chronic): Secondary | ICD-10-CM

## 2021-08-19 DIAGNOSIS — N183 Chronic kidney disease, stage 3 unspecified: Secondary | ICD-10-CM | POA: Insufficient documentation

## 2021-08-19 DIAGNOSIS — E559 Vitamin D deficiency, unspecified: Secondary | ICD-10-CM | POA: Insufficient documentation

## 2021-08-19 DIAGNOSIS — Z85828 Personal history of other malignant neoplasm of skin: Secondary | ICD-10-CM | POA: Insufficient documentation

## 2021-08-19 LAB — CBC WITH DIFFERENTIAL/PLATELET
Abs Immature Granulocytes: 0.04 10*3/uL (ref 0.00–0.07)
Basophils Absolute: 0.1 10*3/uL (ref 0.0–0.1)
Basophils Relative: 1 %
Eosinophils Absolute: 0.3 10*3/uL (ref 0.0–0.5)
Eosinophils Relative: 3 %
HCT: 34.4 % — ABNORMAL LOW (ref 39.0–52.0)
Hemoglobin: 10.4 g/dL — ABNORMAL LOW (ref 13.0–17.0)
Immature Granulocytes: 1 %
Lymphocytes Relative: 16 %
Lymphs Abs: 1.4 10*3/uL (ref 0.7–4.0)
MCH: 24.3 pg — ABNORMAL LOW (ref 26.0–34.0)
MCHC: 30.2 g/dL (ref 30.0–36.0)
MCV: 80.4 fL (ref 80.0–100.0)
Monocytes Absolute: 0.8 10*3/uL (ref 0.1–1.0)
Monocytes Relative: 10 %
Neutro Abs: 6.1 10*3/uL (ref 1.7–7.7)
Neutrophils Relative %: 69 %
Platelets: 362 10*3/uL (ref 150–400)
RBC: 4.28 MIL/uL (ref 4.22–5.81)
RDW: 14.7 % (ref 11.5–15.5)
WBC: 8.7 10*3/uL (ref 4.0–10.5)
nRBC: 0 % (ref 0.0–0.2)

## 2021-08-19 LAB — LIPID PANEL
Cholesterol: 128 mg/dL (ref 0–200)
HDL: 40 mg/dL — ABNORMAL LOW (ref >40)
LDL Cholesterol: 64 mg/dL (ref 0–99)
Total CHOL/HDL Ratio: 3.2 RATIO
Triglycerides: 121 mg/dL (ref <150)
VLDL: 24 mg/dL (ref 0–40)

## 2021-08-19 LAB — IRON AND TIBC
Iron: 30 ug/dL — ABNORMAL LOW (ref 45–182)
Saturation Ratios: 7 % — ABNORMAL LOW (ref 17.9–39.5)
TIBC: 412 ug/dL (ref 250–450)
UIBC: 382 ug/dL

## 2021-08-19 LAB — FERRITIN: Ferritin: 29 ng/mL (ref 24–336)

## 2021-08-19 LAB — VITAMIN D 25 HYDROXY (VIT D DEFICIENCY, FRACTURES): Vit D, 25-Hydroxy: 18.95 ng/mL — ABNORMAL LOW (ref 30–100)

## 2021-08-20 ENCOUNTER — Telehealth: Payer: Self-pay | Admitting: Pharmacist

## 2021-08-20 NOTE — Telephone Encounter (Signed)
Reviewed labs with patient. He would like to continue Repatha and atrovastatin '80mg'$ .

## 2021-08-20 NOTE — Telephone Encounter (Signed)
Pt returning your call

## 2021-08-20 NOTE — Telephone Encounter (Signed)
Good improvement in LDL with atorvastatin '80mg'$  and Repatha.LDL-C is 64. Goal <55. Could consider adding ezetimibe.  compliance

## 2021-08-24 ENCOUNTER — Ambulatory Visit (INDEPENDENT_AMBULATORY_CARE_PROVIDER_SITE_OTHER): Payer: Medicare Other | Admitting: General Surgery

## 2021-08-24 ENCOUNTER — Encounter: Payer: Self-pay | Admitting: General Surgery

## 2021-08-24 VITALS — BP 201/77 | HR 85 | Temp 97.8°F | Resp 14 | Ht 73.0 in | Wt 157.0 lb

## 2021-08-24 DIAGNOSIS — Z09 Encounter for follow-up examination after completed treatment for conditions other than malignant neoplasm: Secondary | ICD-10-CM

## 2021-08-24 NOTE — Progress Notes (Signed)
Subjective:     Michael [name]  Patient here for postoperative wound check.  He is doing well.  He has no complaints.  His bowel movements are normal.  His diet is returning to normal. Objective:    BP (!) 201/77   Pulse 85   Temp 97.8 F (36.6 C) (Oral)   Resp 14   Ht '6\' 1"'$  (1.854 m)   Wt 157 lb (71.2 kg)   SpO2 97%   BMI 20.71 kg/m   General:  alert, cooperative, and no distress  Abdomen is soft, incision healing well.  Staples removed, Steri-Strips applied. Dr. Constance Haw has made him aware of the final pathology report.     Assessment:    Doing well postoperatively.    Plan:   Continue increasing activity as able.  Follow-up here on 09/15/2021 with Dr. Constance Haw.

## 2021-08-25 ENCOUNTER — Encounter: Payer: Medicare Other | Admitting: General Surgery

## 2021-08-25 NOTE — Progress Notes (Deleted)
Cardiology Office Note    Date:  08/25/2021   ID:  Michael [name], DOB 31-Dec-1955, MRN [id number]   PCP:  Jani Gravel, [place]  Cardiologist:  Rozann Lesches, MD *** Advanced Practice Provider:  No care team member to display Electrophysiologist:  None   [phone]   No chief complaint on file.   History of Present Illness:  Michael [name] is a 66 y.o. male with a h/o uncontrolled hypertension, CKD, chronic combined CHF, ischemic heart disease based on abnormal myoview September 2019 with corresponding wall abnormality on echo, bilateral carotid artery disease s/p TCAR,  for asymptomatic high-grade right internal carotid artery stenosis, COPD.  Recently underwent right hemicolectomy for right colon mass.     Past Medical History:  Diagnosis Date   Aortic atherosclerosis ([place]) 08/03/2021   CAD (coronary artery disease) 08/03/2021   Nuclear stress test 10/19/2017: Inferior/septal myocardial infarction with mild peri-infarct ischemia, EF 33 Med Rx - no symptoms   Carotid artery disease (HCC)    Chronic diastolic heart failure (HCC)    CKD (chronic kidney disease) stage 3, GFR 30-59 ml/min (HCC)    COPD (chronic obstructive pulmonary disease) ([place])    Dysrhythmia    Essential hypertension    Head and neck cancer ([place]) 2019   Right facial basal cell carcinoma s/p resection with right partial mastectomy and partal rhinectomy with skin graft (06/13/17)   Iron deficiency anemia    Urinary retention     Past Surgical History:  Procedure Laterality Date   ANKLE CLOSED REDUCTION Right    open reduction   BASAL CELL CARCINOMA EXCISION  2019   at [facility]   BIOPSY  07/19/2021   Procedure: BIOPSY;  Surgeon: Eloise Harman, DO;  [facility];  Service: Endoscopy;;   COLONOSCOPY N/A 05/31/2017   Procedure: COLONOSCOPY;  Surgeon: Daneil Dolin, MD;  [facility];  Service: Endoscopy;  Laterality: N/A;  2:45pm   COLONOSCOPY WITH  PROPOFOL N/A 07/19/2021   Procedure: COLONOSCOPY WITH PROPOFOL;  Surgeon: Eloise Harman, DO;  [facility];  Service: Endoscopy;  Laterality: N/A;  3:00pm, moved up to 9:00   CYSTOSCOPY N/A 10/29/2018   Procedure: CYSTOSCOPY, CLOT EVACUATION;  Surgeon: Ceasar Mons, MD;  Location: [facility];  Service: Urology;  Laterality: N/A;   PARTIAL COLECTOMY Right 08/11/2021   Procedure: PARTIAL COLECTOMY, OPEN RIGHT HEMICOLECTOMY;  Surgeon: Virl Cagey, MD;  Location: [facility];  Service: General;  Laterality: Right;   POLYPECTOMY  05/31/2017   Procedure: POLYPECTOMY;  Surgeon: Daneil Dolin, MD;  [facility];  Service: Endoscopy;;   TONSILLECTOMY     TRANSCAROTID ARTERY REVASCULARIZATION  Right 02/15/2021   Procedure: RIGHT TRANSCAROTID ARTERY REVASCULARIZATION;  Surgeon: [name] Heck, MD;  Location: [facility];  Service: Vascular;  Laterality: Right;   ULTRASOUND GUIDANCE FOR VASCULAR ACCESS Left 02/15/2021   Procedure: ULTRASOUND GUIDANCE FOR VASCULAR ACCESS;  Surgeon: [name] Heck, MD;  Location: [facility];  Service: Vascular;  Laterality: Left;   XI ROBOTIC ASSISTED SIMPLE PROSTATECTOMY N/A 10/29/2018   Procedure: XI ROBOTIC ASSISTED SIMPLE PROSTATECTOMY;  Surgeon: Cleon Gustin, MD;  Location: [facility];  Service: Urology;  Laterality: N/A;    Current Medications: No outpatient medications have been marked as taking for the 08/31/21 encounter (Appointment) with [name], PA-C.     Allergies:   Patient has no known allergies.   Social History   Socioeconomic  History   Marital status: Married    Spouse name: Not on file   Number of children: Not on file   Years of education: Not on file   Highest education level: Not on file  Occupational History   Not on file  Tobacco Use   Smoking status: Former    Packs/day: 0.50    Years: 40.00    Total pack years: 20.00    Types: Cigarettes    Quit date: 07/02/2021    Years since quitting: 0.1     Passive exposure: Never   Smokeless tobacco: Never   Tobacco comments:    Quit on 06/09/21  Vaping Use   Vaping Use: Never used  Substance and Sexual Activity   Alcohol use: Not Currently   Drug use: Never   Sexual activity: Not Currently  Other Topics Concern   Not on file  Social History Narrative   Not on file   Social Determinants of Health   Financial Resource Strain: Low Risk  (12/11/2019)   Overall Financial Resource Strain (CARDIA)    Difficulty of Paying Living Expenses: Not hard at all  Food Insecurity: No Food Insecurity (12/11/2019)   Hunger Vital Sign    Worried About Running Out of Food in the Last Year: Never true    [place] in the Last Year: Never true  Transportation Needs: No Transportation Needs (12/11/2019)   PRAPARE - Hydrologist (Medical): No    Lack of Transportation (Non-Medical): No  Physical Activity: Inactive (12/11/2019)   Exercise Vital Sign    Days of Exercise per Week: 0 days    Minutes of Exercise per Session: 0 min  Stress: No Stress Concern Present (12/11/2019)   [place]    Feeling of Stress : Not at all  Social Connections: Socially Isolated (12/11/2019)   Social Connection and Isolation Panel [NHANES]    Frequency of Communication with Friends and Family: Never    Frequency of Social Gatherings with Friends and Family: Never    Attends Religious Services: Never    Marine scientist or Organizations: No    Attends Music therapist: Never    Marital Status: Divorced     Family History:  The patient's ***family history includes Cirrhosis in his mother; Stroke in his father.   ROS:   Please see the history of present illness.    ROS All other systems reviewed and are negative.   PHYSICAL EXAM:   VS:  There were no vitals taken for this visit.  Physical Exam  GEN: Well nourished, well developed, in no acute  distress  HEENT: normal  Neck: no JVD, carotid bruits, or masses Cardiac:RRR; no murmurs, rubs, or gallops  Respiratory:  clear to auscultation bilaterally, normal work of breathing GI: soft, nontender, nondistended, + BS Ext: without cyanosis, clubbing, or edema, Good distal pulses bilaterally MS: no deformity or atrophy  Skin: warm and dry, no rash Neuro:  Alert and Oriented x 3, Strength and sensation are intact Psych: euthymic mood, full affect  Wt Readings from Last 3 Encounters:  08/24/21 157 lb (71.2 kg)  08/14/21 167 lb 12.3 oz (76.1 kg)  08/09/21 164 lb (74.4 kg)      Studies/Labs Reviewed:   EKG:  EKG is*** ordered today.  The ekg ordered today demonstrates ***  Recent Labs: 02/09/2021: ALT 27 08/13/2021: Magnesium 2.2 08/14/2021: BUN 26; Creatinine, Ser 1.72; Potassium  4.2; Sodium 141 08/19/2021: Hemoglobin 10.4; Platelets 362   Lipid Panel    Component Value Date/Time   CHOL 128 08/19/2021 1030   TRIG 121 08/19/2021 1030   HDL 40 (L) 08/19/2021 1030   CHOLHDL 3.2 08/19/2021 1030   VLDL 24 08/19/2021 1030   LDLCALC 64 08/19/2021 1030    Additional studies/ records that were reviewed today include:  Nuclear stress test 10/2017 Findings consistent with prior inferior/septal myocardial infarction with mild peri-infarct ischemia. This is a high risk study. Risk primarily based on decreased LVEF. There is a prior infarct with only mild current ischemia. Consider correlating LVEF with echocardiogram. The left ventricular ejection fraction is moderately decreased (33%).   2D echo 6/2019Study Conclusions   - Left ventricle: The cavity size was normal. There was mild   concentric hypertrophy. Systolic function was normal. The   estimated ejection fraction was in the range of 55% to 60%. Wall   motion was normal; there were no regional wall motion   abnormalities. Doppler parameters are consistent with both   elevated ventricular end-diastolic filling pressure and  elevated   left atrial filling pressure. - Mitral valve: Calcified annulus. Moderately thickened leaflets . - Left atrium: The atrium was moderately dilated. - Atrial septum: No defect or patent foramen ovale was identified.    CT 3/2019IMPRESSION: 1. No pulmonary embolus. 2. Cardiomegaly with small pleural effusions and mild pulmonary edema, suggesting mild CHF. 3. Mild central bronchial thickening is nonspecific and may be bronchial inflammation or secondary to pulmonary edema. 4. Small right and left upper lobe pulmonary nodules, each measuring 4 mm. No follow-up needed if patient is low-risk (and has no known or suspected primary neoplasm). Non-contrast chest CT can be considered in 12 months if patient is high-risk. This recommendation follows the consensus statement: Guidelines for Management of Incidental Pulmonary Nodules Detected on CT Images: From the Fleischner Society 2017; Radiology 2017; 284:228-243. 5. Moderate Aortic Atherosclerosis (ICD10-I70.0). Coronary artery calcifications.   Risk Assessment/Calculations:   {Does this patient have ATRIAL FIBRILLATION?:[phone]}     ASSESSMENT:    No diagnosis found.   PLAN:  In order of problems listed above:   Ischemic heart disease based on previous noninvasive cardiac imaging with plan for medical therapy in the absence of progressive angina symptoms.  LVEF 45 to 50% range,   Plan to continue aspirin, Lipitor, Imdur, lisinopril, and Toprol-XL.  HTN uncontrolled     Carotid artery disease, severe R ICA stenosis  SP TCAR  He is on aspirin and statin.    CKD stage IIIb, last creatinine    Shared Decision Making/Informed Consent   {Are you ordering a CV Procedure (e.g. stress test, cath, DCCV, TEE, etc)?   Press F2        :[id number]}    Medication Adjustments/Labs and Tests Ordered: Current medicines are reviewed at length with the patient today.  Concerns regarding medicines are outlined above.  Medication  changes, Labs and Tests ordered today are listed in the Patient Instructions below. There are no Patient Instructions on file for this visit.   Sumner Boast, PA-C  08/25/2021 3:23 PM    [facility] [place], [place],   [address] Phone: [phone]; Fax: [phone]

## 2021-08-25 NOTE — Progress Notes (Unsigned)
[place], [place] [address]   CLINIC:  Medical Oncology/Hematology  PCP:  Jani Gravel, MD [place] [address] [phone]   REASON FOR VISIT:  Follow-up for iron deficiency anemia  CURRENT THERAPY: Intermittent IV iron (most recent IV Venofer 300 mg x 3 from 04/30/2021 through 05/19/2021)  INTERVAL HISTORY:  Michael [name] 66 y.o. male returns for routine follow-up of his iron deficiency anemia.  He was last seen by Tarri Abernethy PA-C on 04/26/2021.  Since his last visit, patient underwent right hemicolectomy for right colon mass on 08/11/2021.   Surgical pathology showed multiple tubular adenomas, several of which showed focal high-grade dysplasia, but were negative for invasive carcinoma.  At today's visit, he reports feeling fairly well, and is recovering well after his recent open abdominal surgery.  He reports moderate fatigue with energy at about 50%.  He reports that his bowel movements are generally dark, and he denies any obvious bleeding such as bright blood per rectum or gross melena.  He has positional vertigo.  No pica, restless legs, headaches, chest pain, dyspnea on exertion, lightheadedness, or syncope.  Patient reports low appetite.  He eats about 2 meals daily, denies any nausea, vomiting, abdominal pain, diarrhea.  He tried drinking Ensure, but "did not love the taste."  Weight today is 154 pounds, which is about 4 pounds less than his last visit (158 pounds on 04/26/2021).   REVIEW OF SYSTEMS:  Review of Systems  Constitutional:  Positive for appetite change and fatigue. Negative for chills, diaphoresis, fever and unexpected weight change.  HENT:   Negative for lump/mass and nosebleeds.   Eyes:  Negative for eye problems.  Respiratory:  Negative for cough, hemoptysis and shortness of breath.   Cardiovascular:  Negative for chest pain, leg swelling and palpitations.  Gastrointestinal:  Negative  for abdominal pain, blood in stool, constipation, diarrhea, nausea and vomiting.  Genitourinary:  Negative for hematuria.   Skin: Negative.   Neurological:  Negative for dizziness, headaches and light-headedness.  Hematological:  Does not bruise/bleed easily.      PAST MEDICAL/SURGICAL HISTORY:  Past Medical History:  Diagnosis Date   Aortic atherosclerosis ([facility]) 08/03/2021   CAD (coronary artery disease) 08/03/2021   Nuclear stress test 10/19/2017: Inferior/septal myocardial infarction with mild peri-infarct ischemia, EF 33 Med Rx - no symptoms   Carotid artery disease (HCC)    Chronic diastolic heart failure (HCC)    CKD (chronic kidney disease) stage 3, GFR 30-59 ml/min (HCC)    COPD (chronic obstructive pulmonary disease) ([facility])    Dysrhythmia    Essential hypertension    Head and neck cancer ([facility]) 2019   Right facial basal cell carcinoma s/p resection with right partial mastectomy and partal rhinectomy with skin graft (06/13/17)   Iron deficiency anemia    Urinary retention    Past Surgical History:  Procedure Laterality Date   ANKLE CLOSED REDUCTION Right    open reduction   BASAL CELL CARCINOMA EXCISION  2019   at [facility]   BIOPSY  07/19/2021   Procedure: BIOPSY;  Surgeon: Eloise Harman, DO;  [facility];  Service: Endoscopy;;   COLONOSCOPY N/A 05/31/2017   Procedure: COLONOSCOPY;  Surgeon: Daneil Dolin, MD;  [facility];  Service: Endoscopy;  Laterality: N/A;  2:45pm   COLONOSCOPY WITH PROPOFOL N/A 07/19/2021   Procedure: COLONOSCOPY WITH PROPOFOL;  Surgeon: Eloise Harman, DO;  [facility];  Service: Endoscopy;  Laterality: N/A;  3:00pm, moved up to 9:00   CYSTOSCOPY N/A 10/29/2018   Procedure: CYSTOSCOPY, CLOT EVACUATION;  Surgeon: Ceasar Mons, MD;  [facility]: [facility];  Service: Urology;  Laterality: N/A;   PARTIAL COLECTOMY Right 08/11/2021   Procedure: PARTIAL COLECTOMY, OPEN RIGHT HEMICOLECTOMY;  Surgeon: Virl Cagey, MD;  Location: [facility];  Service: General;  Laterality: Right;   POLYPECTOMY  05/31/2017   Procedure: POLYPECTOMY;  Surgeon: Daneil Dolin, MD;  [facility];  Service: Endoscopy;;   TONSILLECTOMY     TRANSCAROTID ARTERY REVASCULARIZATION  Right 02/15/2021   Procedure: RIGHT TRANSCAROTID ARTERY REVASCULARIZATION;  Surgeon: [name] Heck, MD;  Location: [facility];  Service: Vascular;  Laterality: Right;   ULTRASOUND GUIDANCE FOR VASCULAR ACCESS Left 02/15/2021   Procedure: ULTRASOUND GUIDANCE FOR VASCULAR ACCESS;  Surgeon: [name] Heck, MD;  Location: [facility];  Service: Vascular;  Laterality: Left;   XI ROBOTIC ASSISTED SIMPLE PROSTATECTOMY N/A 10/29/2018   Procedure: XI ROBOTIC ASSISTED SIMPLE PROSTATECTOMY;  Surgeon: Cleon Gustin, MD;  [facility];  Service: Urology;  Laterality: N/A;     SOCIAL HISTORY:  Social History   Socioeconomic History   Marital status: Married    Spouse name: Not on file   Number of children: Not on file   Years of education: Not on file   Highest education level: Not on file  Occupational History   Not on file  Tobacco Use   Smoking status: Former    Packs/day: 0.50    Years: 40.00    Total pack years: 20.00    Types: Cigarettes    Quit date: 07/02/2021    Years since quitting: 0.1    Passive exposure: Never   Smokeless tobacco: Never   Tobacco comments:    Quit on 06/09/21  Vaping Use   Vaping Use: Never used  Substance and Sexual Activity   Alcohol use: Not Currently   Drug use: Never   Sexual activity: Not Currently  Other Topics Concern   Not on file  Social History Narrative   Not on file   Social Determinants of Health   Financial Resource Strain: Low Risk  (12/11/2019)   Overall Financial Resource Strain (CARDIA)    Difficulty of Paying Living Expenses: Not hard at all  Food Insecurity: No Food Insecurity (12/11/2019)   Hunger Vital Sign    Worried About Running Out of Food in the Last Year:  Never true    [place] in the Last Year: Never true  Transportation Needs: No Transportation Needs (12/11/2019)   PRAPARE - Hydrologist (Medical): No    Lack of Transportation (Non-Medical): No  Physical Activity: Inactive (12/11/2019)   Exercise Vital Sign    Days of Exercise per Week: 0 days    Minutes of Exercise per Session: 0 min  Stress: No Stress Concern Present (12/11/2019)   [place]    Feeling of Stress : Not at all  Social Connections: Socially Isolated (12/11/2019)   Social Connection and Isolation Panel [NHANES]    Frequency of Communication with Friends and Family: Never    Frequency of Social Gatherings with Friends and Family: Never    Attends Religious Services: Never    Marine scientist or Organizations: No    Attends Archivist Meetings: Never    Marital Status: Divorced  Human resources officer  Violence: Not At Risk (12/11/2019)   Humiliation, Afraid, Rape, and Kick questionnaire    Fear of Current or Ex-Partner: No    Emotionally Abused: No    Physically Abused: No    Sexually Abused: No    FAMILY HISTORY:  Family History  Problem Relation Age of Onset   Stroke Father    Cirrhosis Mother    Colon cancer Neg Hx     CURRENT MEDICATIONS:  Outpatient Encounter Medications as of 08/26/2021  Medication Sig   albuterol (VENTOLIN HFA) 108 (90 Base) MCG/ACT inhaler Inhale 2 puffs into the lungs every 6 (six) hours as needed for wheezing or shortness of breath.   aspirin EC 81 MG tablet Take 81 mg by mouth daily.   atorvastatin (LIPITOR) 80 MG tablet Take 1 tablet (80 mg total) by mouth every evening.   clopidogrel (PLAVIX) 75 MG tablet Take 1 tablet (75 mg total) by mouth daily.   ergocalciferol (VITAMIN D2) 1.25 MG (50000 UT) capsule Take 1 capsule (50,000 Units total) by mouth once a week. (Patient not taking: Reported on 07/09/2021)   Evolocumab  (REPATHA SURECLICK) 937 MG/ML SOAJ Inject 1 pen into the skin every 14 (fourteen) days.   isosorbide mononitrate (IMDUR) 30 MG 24 hr tablet Take 1 tablet (30 mg total) by mouth every evening.   metoprolol succinate (TOPROL-XL) 25 MG 24 hr tablet Take 1 tablet (25 mg total) by mouth every evening.   ondansetron (ZOFRAN) 4 MG tablet Take 1 tablet (4 mg total) by mouth every 6 (six) hours as needed for nausea.   potassium chloride (KLOR-CON) 10 MEQ tablet Take 1 tablet (10 mEq total) by mouth daily.   trimethoprim (TRIMPEX) 100 MG tablet Take 1 tablet (100 mg total) by mouth at bedtime.   No facility-administered encounter medications on file as of 08/26/2021.    ALLERGIES:  No Known Allergies   PHYSICAL EXAM:  ECOG PERFORMANCE STATUS: 1 - Symptomatic but completely ambulatory  There were no vitals filed for this visit. There were no vitals filed for this visit. Physical Exam Constitutional:      Appearance: Normal appearance.  HENT:     Head: Normocephalic and atraumatic.     Comments: Nasal deformity (right side) following resection of skin cancer    Mouth/Throat:     Mouth: Mucous membranes are moist.  Eyes:     Extraocular Movements: Extraocular movements intact.     Pupils: Pupils are equal, round, and reactive to light.  Cardiovascular:     Rate and Rhythm: Normal rate and regular rhythm.     Pulses: Normal pulses.     Heart sounds: Murmur (faint) heard.  Pulmonary:     Effort: Pulmonary effort is normal.     Breath sounds: Normal breath sounds. Decreased air movement present.  Abdominal:     General: Bowel sounds are normal.     Palpations: Abdomen is soft.     Tenderness: There is no abdominal tenderness.       Comments: Midline abdominal incision healing well.  No erythema, open skin, or drainage.    Musculoskeletal:        General: No swelling.     Right lower leg: No edema.     Left lower leg: No edema.  Lymphadenopathy:     Cervical: No cervical adenopathy.   Skin:    General: Skin is warm and dry.  Neurological:     General: No focal deficit present.     Mental Status: He is  alert and oriented to person, place, and time.  Psychiatric:        Mood and Affect: Mood normal.        Behavior: Behavior normal.      LABORATORY DATA:  I have reviewed the labs as listed.  CBC    Component Value Date/Time   WBC 8.7 08/19/2021 1025   RBC 4.28 08/19/2021 1025   HGB 10.4 (L) 08/19/2021 1025   HGB 13.8 04/23/2011 0919   HCT 34.4 (L) 08/19/2021 1025   HCT 26.0 (L) 05/09/2018 0455   PLT 362 08/19/2021 1025   PLT 208 04/23/2011 0919   MCV 80.4 08/19/2021 1025   MCV 84 04/23/2011 0919   MCH 24.3 (L) 08/19/2021 1025   MCHC 30.2 08/19/2021 1025   RDW 14.7 08/19/2021 1025   RDW 12.3 04/23/2011 0919   LYMPHSABS 1.4 08/19/2021 1025   LYMPHSABS 1.1 04/23/2011 0919   MONOABS 0.8 08/19/2021 1025   MONOABS 0.9 (H) 04/23/2011 0919   EOSABS 0.3 08/19/2021 1025   EOSABS 0.0 04/23/2011 0919   BASOSABS 0.1 08/19/2021 1025   BASOSABS 0.0 04/23/2011 0919      Latest Ref Rng & Units 08/14/2021    6:26 AM 08/13/2021    4:52 AM 08/12/2021    5:06 AM  CMP  Glucose 70 - 99 mg/dL 113  111  156   BUN 8 - 23 mg/dL '26  31  27   '$ Creatinine 0.61 - 1.24 mg/dL 1.72  1.91  2.02   Sodium 135 - 145 mmol/L 141  141  141   Potassium 3.5 - 5.1 mmol/L 4.2  3.7  3.5   Chloride 98 - 111 mmol/L 110  108  108   CO2 22 - 32 mmol/L '23  23  23   '$ Calcium 8.9 - 10.3 mg/dL 8.4  8.7  8.5     DIAGNOSTIC IMAGING:  I have independently reviewed the relevant imaging and discussed with the patient.  ASSESSMENT & PLAN: 1.  Iron deficiency anemia - Combination anemia from CKD, malabsorption and blood loss. - Patient is hesitant to try oral iron due to abdominal side effects - He has been on intermittent IV iron infusions.  (Last given(most recent IV Venofer 300 mg x 3 from 04/30/2021 through 05/19/2021) - Most recent colonoscopy (07/19/2021) with villous, fungating, infiltrative  and polypoid nonobstructing mass in ascending colon (addressed below); no bleeding was present - Hemoccult stool was POSITIVE x3 (June 2022) - Intermittent dark stool and occasional red streaks  - Most recent labs (08/19/2021): Hgb 10.4, ferritin 29, iron saturation 7% with TIBC 412 - Persistent iron deficiency despite IV repletion is suspicious for ongoing occult blood loss, but we will hopefully see this improve now that his colon mass has been removed (right hemicolectomy on 08/11/2021) - PLAN: Recommend additional IV Venofer 300 mg x 3 doses - Repeat labs and RTC in 4 months. - Continue GI follow-up - Patient is aware of alarm signs/symptoms that would prompt immediate medical attention  2.  Locally advanced basal cell carcinoma right nasolabial fold: - Biopsy of the right nasal mucosa on 04/05/2017 with basal cell carcinoma. - Biopsy of the left cheek lesion on 04/02/2017 consistent with basal cell carcinoma. - Resection with skin graft on 06/06/2017.  Margin was close.  Rest of the margins were negative. - 10 rounds of radiation by Dr. Isidore Moos for recurrent basal cell carcinoma to the right medial canthus completed on 03/20/2020.   3.  Right colon mass (tubular adenomas  with high-grade dysplasia) - PET scan in 2019 (obtained due to basal cell skin cancer) demonstrated area of concern with mass in ascending colon - Colonoscopy in 2019 by Dr. Gala Romney demonstrated multiple polyps with tubular adenoma, and patient was referred for surgical excision of colon due to volume of polyps and concern for more extensive disease.  Patient was lost to follow-up with GI and did not follow-up with surgeon at this time. - Reestablished care with GI in May 2023, with colonoscopy he underwent colonoscopy  (07/19/2021) showing villous, fungating, infiltrative and polypoid nonobstructing mass in ascending colon - CT abdomen/pelvis (07/05/2021): Lobular intraluminal mass at: Of hepatic flexure measuring up to 8 cm in length;  new pancreatic ductal dilation and atrophy favored to be sequela of chronic pancreatitis but MRI recommended - MRI abdomen (08/06/2021) showed upstream pancreatic atrophy and duct dilation secondary to dominant ductal calculi; no convincing evidence of underlying neoplasm - Due to large size of residual tubular adenoma/right colon mass, patient underwent right hemicolectomy on 08/11/2021.  Surgical pathology showed multiple tubular adenomas, several of which showed focal high-grade dysplasia, but were negative for invasive carcinoma.   - PLAN: Continue follow-up with GI  4.  Elevated blood pressure - Patient noted to have elevated blood pressure 174/95, which he says is "normal for him" - He reports that he does not know which medications he took this morning - He denies any associated chest pain, dyspnea, lightheadedness, vision changes, neurologic deficits, headaches - PLAN: Patient given strict instructions to recheck blood pressure at home and follow-up with his PCP if it remains elevated  5.  Vitamin D deficiency - Patient is not sure if he is taking his vitamin D 50,000 units weekly - Most recent vitamin D (07/31/2021): Remains low at 18.95 - PLAN: Prescription re-sent for vitamin D ergocalciferol 50,000 units weekly.     PLAN SUMMARY & DISPOSITION: Venofer 300 mg x 3 Labs in 4 months RTC after labs  All questions were answered. The patient knows to call the clinic with any problems, questions or concerns.  Medical decision making: Moderate  Time spent on visit: I spent 20 minutes counseling the patient face to face. The total time spent in the appointment was 30 minutes and more than 50% was on counseling.   Harriett Rush, PA-C  08/26/2021 2:40 PM

## 2021-08-26 ENCOUNTER — Inpatient Hospital Stay (HOSPITAL_BASED_OUTPATIENT_CLINIC_OR_DEPARTMENT_OTHER): Payer: Medicare Other | Admitting: Physician Assistant

## 2021-08-26 ENCOUNTER — Encounter (HOSPITAL_COMMUNITY): Payer: Self-pay | Admitting: Physician Assistant

## 2021-08-26 VITALS — HR 76 | Temp 98.0°F | Resp 20 | Wt 154.2 lb

## 2021-08-26 DIAGNOSIS — I10 Essential (primary) hypertension: Secondary | ICD-10-CM | POA: Diagnosis not present

## 2021-08-26 DIAGNOSIS — D509 Iron deficiency anemia, unspecified: Secondary | ICD-10-CM | POA: Diagnosis not present

## 2021-08-26 DIAGNOSIS — D500 Iron deficiency anemia secondary to blood loss (chronic): Secondary | ICD-10-CM

## 2021-08-26 DIAGNOSIS — E559 Vitamin D deficiency, unspecified: Secondary | ICD-10-CM

## 2021-08-26 MED ORDER — ERGOCALCIFEROL 1.25 MG (50000 UT) PO CAPS: 50000.0000 [IU] | ORAL_CAPSULE | ORAL | 6 refills | Status: DC

## 2021-08-26 NOTE — Patient Instructions (Signed)
[place] at [facility] Discharge Instructions  You were seen today by Tarri Abernethy PA-C for your iron deficiency anemia.  Your blood and iron levels remain low.  You may be continuing to lose blood from somewhere in your GI tract, and it is important that you continue to follow-up with the GI doctors.  We will schedule you for IV iron infusion x3 doses.  Your blood pressure was high at your visit today.  Please make sure that you do not miss any doses of your blood pressure medications.  If your blood pressure remains high at home, please call your primary care doctor for adjustment of your blood pressure medications.  Seek immediate medical attention if you experience any chest pain, difficulty breathing, headache, dizziness, blurry vision, or strokelike symptoms.  LABS: Return in 4 months for repeat labs  OTHER TESTS: No other tests at this time  MEDICATIONS: Prescription for vitamin D 50,000 units weekly has been sent to your pharmacy [facility])  FOLLOW-UP APPOINTMENT: Office visit in 4 months, after labs   Thank you for choosing [place] at [facility] to provide your oncology and hematology care.  To afford each patient quality time with our provider, please arrive at least 15 minutes before your scheduled appointment time.   If you have a lab appointment with the [facility] please come in thru the Main Entrance and check in at the main information desk.  You need to re-schedule your appointment should you arrive 10 or more minutes late.  We strive to give you quality time with our providers, and arriving late affects you and other patients whose appointments are after yours.  Also, if you no show three or more times for appointments you may be dismissed from the clinic at the providers discretion.     Again, thank you for choosing [facility].  Our hope is that these requests will decrease the amount of  time that you wait before being seen by our physicians.       _____________________________________________________________  Should you have questions after your visit to [facility], please contact our office at [phone] and follow the prompts.  Our office hours are 8:00 a.m. and 4:30 p.m. Monday - Friday.  Please note that voicemails left after 4:00 p.m. may not be returned until the following business day.  We are closed weekends and major holidays.  You do have access to a nurse 24-7, just call the main number to the clinic [phone] and do not press any options, hold on the line and a nurse will answer the phone.    For prescription refill requests, have your pharmacy contact our office and allow 72 hours.    Due to Covid, you will need to wear a mask upon entering the hospital. If you do not have a mask, a mask will be given to you at the Main Entrance upon arrival. For doctor visits, patients may have 1 support person age 67 or older with them. For treatment visits, patients can not have anyone with them due to social distancing guidelines and our immunocompromised population.

## 2021-08-31 ENCOUNTER — Ambulatory Visit: Payer: Medicare Other | Admitting: Physician Assistant

## 2021-09-03 ENCOUNTER — Telehealth: Payer: Self-pay | Admitting: Gastroenterology

## 2021-09-03 NOTE — Telephone Encounter (Signed)
Please see below.   Patient needs office follow up with Dr. Gala Romney only. Please put in the appt notes,  "Follow up of pancreatic duct dilation/abnormal pancreas on MRI, follow up colon surgery". He is a Dr. Gala Romney patient, Dr. Abbey Chatters just assisted with expediting colonoscopy.

## 2021-09-03 NOTE — Telephone Encounter (Signed)
-----   Message from Eloise Harman, DO sent at 08/09/2021  5:31 PM EDT ----- Regarding: RE: Thanks GM ----- Message ----- From: Irving Copas., MD Sent: 08/09/2021   4:56 PM EDT To: Mahala Menghini, PA-C; Virl Cagey, MD; #  [facility] and [facility], I think his hemicolectomy takes precedence, especially based on notation in the chart would not be suggestive of overt chronic pancreatitis symptoms currently.  If he has issues in future, then could be considered/discussed for other things in the future. GM ----- Message ----- From: Eloise Harman, DO Sent: 08/09/2021   8:45 AM EDT To: Mahala Menghini, PA-C; Virl Cagey, MD; #  GM,   Wanted to run this patient by you.  He is scheduled for right hemicolectomy on Wednesday for ascending colon mass.  Recently underwent MRI which showed pancreatic ductal dilatation, multiple stones.  Just wanted to get your thoughts.  I am assuming okay to proceed with surgery as scheduled but just wanted make sure these do not need to be addressed first.  Thanks for your help as always.  I added Dr. Constance Haw his surgeon as well as Magda Paganini to this thread.  [place]

## 2021-09-10 NOTE — Telephone Encounter (Signed)
OV made, patient aware

## 2021-09-13 ENCOUNTER — Inpatient Hospital Stay: Payer: Medicare Other | Attending: Hematology

## 2021-09-13 VITALS — BP 152/97 | HR 84 | Temp 97.6°F | Resp 20

## 2021-09-13 DIAGNOSIS — D509 Iron deficiency anemia, unspecified: Secondary | ICD-10-CM | POA: Diagnosis present

## 2021-09-13 DIAGNOSIS — D500 Iron deficiency anemia secondary to blood loss (chronic): Secondary | ICD-10-CM

## 2021-09-13 MED ORDER — SODIUM CHLORIDE 0.9 % IV SOLN
300.0000 mg | Freq: Once | INTRAVENOUS | Status: AC
  Administered 2021-09-13: 300 mg via INTRAVENOUS
  Filled 2021-09-13: qty 300

## 2021-09-13 MED ORDER — LORATADINE 10 MG PO TABS
10.0000 mg | ORAL_TABLET | Freq: Once | ORAL | Status: AC
  Administered 2021-09-13: 10 mg via ORAL

## 2021-09-13 MED ORDER — SODIUM CHLORIDE 0.9 % IV SOLN: Freq: Once | INTRAVENOUS | Status: AC

## 2021-09-13 MED ORDER — ACETAMINOPHEN 325 MG PO TABS
650.0000 mg | ORAL_TABLET | Freq: Once | ORAL | Status: AC
  Administered 2021-09-13: 650 mg via ORAL

## 2021-09-13 NOTE — Patient Instructions (Signed)
[facility]  Discharge Instructions: Thank you for choosing [facility] to provide your oncology and hematology care.  If you have a lab appointment with the Cancer Center, please come in thru the Main Entrance and check in at the main information desk.  Wear comfortable clothing and clothing appropriate for easy access to any Portacath or PICC line.   We strive to give you quality time with your provider. You may need to reschedule your appointment if you arrive late (15 or more minutes).  Arriving late affects you and other patients whose appointments are after yours.  Also, if you miss three or more appointments without notifying the office, you may be dismissed from the clinic at the provider's discretion.      For prescription refill requests, have your pharmacy contact our office and allow 72 hours for refills to be completed.    Today you received the following Venofer, return as scheduled.   To help prevent nausea and vomiting after your treatment, we encourage you to take your nausea medication as directed.  BELOW ARE SYMPTOMS THAT SHOULD BE REPORTED IMMEDIATELY: *FEVER GREATER THAN 100.4 F (38 C) OR HIGHER *CHILLS OR SWEATING *NAUSEA AND VOMITING THAT IS NOT CONTROLLED WITH YOUR NAUSEA MEDICATION *UNUSUAL SHORTNESS OF BREATH *UNUSUAL BRUISING OR BLEEDING *URINARY PROBLEMS (pain or burning when urinating, or frequent urination) *BOWEL PROBLEMS (unusual diarrhea, constipation, pain near the anus) TENDERNESS IN MOUTH AND THROAT WITH OR WITHOUT PRESENCE OF ULCERS (sore throat, sores in mouth, or a toothache) UNUSUAL RASH, SWELLING OR PAIN  UNUSUAL VAGINAL DISCHARGE OR ITCHING   Items with * indicate a potential emergency and should be followed up as soon as possible or go to the Emergency Department if any problems should occur.  Please show the CHEMOTHERAPY ALERT CARD or IMMUNOTHERAPY ALERT CARD at check-in to the Emergency Department and triage  nurse.  Should you have questions after your visit or need to cancel or reschedule your appointment, please contact [facility] AT  [phone]  and follow the prompts.  Office hours are 8:00 a.m. to 4:30 p.m. Monday - Friday. Please note that voicemails left after 4:00 p.m. may not be returned until the following business day.  We are closed weekends and major holidays. You have access to a nurse at all times for urgent questions. Please call the main number to the clinic [phone] and follow the prompts.  For any non-urgent questions, you may also contact your provider using MyChart. We now offer e-Visits for anyone 18 and older to request care online for non-urgent symptoms. For details visit [email or web address].   Also download the MyChart app! Go to the app store, search "MyChart", open the app, select [facility], and log in with your MyChart username and password.  Masks are optional in the cancer centers. If you would like for your care team to wear a mask while they are taking care of you, please let them know. For doctor visits, patients may have with them one support person who is at least 67 years old. At this time, visitors are not allowed in the infusion area.  

## 2021-09-13 NOTE — Progress Notes (Signed)
Patient tolerated iron infusion with no complaints voiced. Peripheral IV site clean and dry with good blood return noted before and after infusion. Patient refused to wait recommended 30 minute wait time, patient educated on importance of picking up BP medication from his pharmacy. Band aid applied. VSS with discharge and left in satisfactory condition with no s/s of distress noted.

## 2021-09-15 ENCOUNTER — Encounter: Payer: Self-pay | Admitting: General Surgery

## 2021-09-15 ENCOUNTER — Ambulatory Visit (INDEPENDENT_AMBULATORY_CARE_PROVIDER_SITE_OTHER): Payer: Medicare Other | Admitting: General Surgery

## 2021-09-15 VITALS — BP 180/117 | HR 93 | Temp 97.7°F | Resp 16 | Ht 73.0 in | Wt 161.0 lb

## 2021-09-15 DIAGNOSIS — K6389 Other specified diseases of intestine: Secondary | ICD-10-CM

## 2021-09-15 NOTE — Patient Instructions (Addendum)
No heavy lifting > 10 lbs, excessive bending, pushing, pulling, or squatting for 6-8 weeks after surgery.  Diet as tolerated.  Follow up with PCP about continued SOB after surgery and need for inhaler/ albuterol.   Dr. Neysa Hotter, NP September 29, 2021 @ 2:30pm

## 2021-09-15 NOTE — Progress Notes (Unsigned)
[facility]  Doing well. Eating and having Bms. Having some SOB at times and is on an albuterol inhaler from the hospital. He actually has not been since Dr. Maudie Mercury in years. He needs a new appt with PCP.   BP (!) 180/117   Pulse 93   Temp 97.7 F (36.5 C) (Oral)   Resp 16   Ht '6\' 1"'$  (1.854 m)   Wt 161 lb (73 kg)   SpO2 98%   BMI 21.24 kg/m  Midline healing, no hernia, port sites healing No wheezing today  Pathology: FINAL MICROSCOPIC DIAGNOSIS:   A. RIGHT COLON, MASS, RESECTION:  Multiple tubular adenomas, several of which show focal high-grade  dysplasia  Negative invasive carcinoma  Largest polyp measures 4.4 cm in greatest dimension  Benign submucosal lipoma  Margins free of adenomatous change  Thirteen benign pericolic lymph nodes (1/51)  Benign appendix   Patient s/p laparoscopic right hemicolectomy. Doing well.   No heavy lifting > 10 lbs, excessive bending, pushing, pulling, or squatting for 6-8 weeks after surgery.  Diet as tolerated.  Follow up with PCP about continued SOB after surgery and need for inhaler/ albuterol.   Dr. Neysa Hotter, NP September 29, 2021 @ 2:30pm   Curlene Labrum, MD [facility] [address] [place], [place] [address] [phone] (office)

## 2021-09-20 ENCOUNTER — Inpatient Hospital Stay: Payer: Medicare Other

## 2021-09-20 VITALS — BP 164/92 | HR 86 | Temp 97.0°F | Resp 20

## 2021-09-20 DIAGNOSIS — D500 Iron deficiency anemia secondary to blood loss (chronic): Secondary | ICD-10-CM

## 2021-09-20 DIAGNOSIS — D509 Iron deficiency anemia, unspecified: Secondary | ICD-10-CM | POA: Diagnosis not present

## 2021-09-20 MED ORDER — LORATADINE 10 MG PO TABS
10.0000 mg | ORAL_TABLET | Freq: Once | ORAL | Status: AC
  Administered 2021-09-20: 10 mg via ORAL
  Filled 2021-09-20: qty 1

## 2021-09-20 MED ORDER — ACETAMINOPHEN 325 MG PO TABS
650.0000 mg | ORAL_TABLET | Freq: Once | ORAL | Status: AC
  Administered 2021-09-20: 650 mg via ORAL
  Filled 2021-09-20: qty 2

## 2021-09-20 MED ORDER — SODIUM CHLORIDE 0.9 % IV SOLN
300.0000 mg | Freq: Once | INTRAVENOUS | Status: AC
  Administered 2021-09-20: 300 mg via INTRAVENOUS
  Filled 2021-09-20: qty 300

## 2021-09-20 MED ORDER — SODIUM CHLORIDE 0.9 % IV SOLN: Freq: Once | INTRAVENOUS | Status: AC

## 2021-09-20 NOTE — Patient Instructions (Signed)
[facility]  Discharge Instructions: Thank you for choosing [facility] to provide your oncology and hematology care.  If you have a lab appointment with the Cancer Center, please come in thru the Main Entrance and check in at the main information desk.  Wear comfortable clothing and clothing appropriate for easy access to any Portacath or PICC line.   We strive to give you quality time with your provider. You may need to reschedule your appointment if you arrive late (15 or more minutes).  Arriving late affects you and other patients whose appointments are after yours.  Also, if you miss three or more appointments without notifying the office, you may be dismissed from the clinic at the provider's discretion.      For prescription refill requests, have your pharmacy contact our office and allow 72 hours for refills to be completed.    Today you received Venofer IV iron.   BELOW ARE SYMPTOMS THAT SHOULD BE REPORTED IMMEDIATELY: *FEVER GREATER THAN 100.4 F (38 C) OR HIGHER *CHILLS OR SWEATING *NAUSEA AND VOMITING THAT IS NOT CONTROLLED WITH YOUR NAUSEA MEDICATION *UNUSUAL SHORTNESS OF BREATH *UNUSUAL BRUISING OR BLEEDING *URINARY PROBLEMS (pain or burning when urinating, or frequent urination) *BOWEL PROBLEMS (unusual diarrhea, constipation, pain near the anus) TENDERNESS IN MOUTH AND THROAT WITH OR WITHOUT PRESENCE OF ULCERS (sore throat, sores in mouth, or a toothache) UNUSUAL RASH, SWELLING OR PAIN  UNUSUAL VAGINAL DISCHARGE OR ITCHING   Items with * indicate a potential emergency and should be followed up as soon as possible or go to the Emergency Department if any problems should occur.  Please show the CHEMOTHERAPY ALERT CARD or IMMUNOTHERAPY ALERT CARD at check-in to the Emergency Department and triage nurse.  Should you have questions after your visit or need to cancel or reschedule your appointment, please contact [facility] [phone]  and follow the prompts.  Office hours are 8:00 a.m. to 4:30 p.m. Monday - Friday. Please note that voicemails left after 4:00 p.m. may not be returned until the following business day.  We are closed weekends and major holidays. You have access to a nurse at all times for urgent questions. Please call the main number to the clinic [phone] and follow the prompts.  For any non-urgent questions, you may also contact your provider using MyChart. We now offer e-Visits for anyone 18 and older to request care online for non-urgent symptoms. For details visit [email or web address].   Also download the MyChart app! Go to the app store, search "MyChart", open the app, select [facility], and log in with your MyChart username and password.  Masks are optional in the cancer centers. If you would like for your care team to wear a mask while they are taking care of you, please let them know. You may have one support person who is at least 67 years old accompany you for your appointments.  

## 2021-09-20 NOTE — Progress Notes (Signed)
Pt presents today for Venofer IV iron infusion per provider's order. Vital signs stable and pt voiced no new complaints at this time.  Peripheral IV started with good blood return pre and post infusion.  Venofer 300 mg  given today per MD orders. Tolerated infusion without adverse affects. Vital signs stable. No complaints at this time. Discharged from clinic ambulatory in stable condition. Alert and oriented x 3. F/U with [facility] as scheduled.   

## 2021-09-21 ENCOUNTER — Ambulatory Visit: Payer: Medicare Other | Admitting: Internal Medicine

## 2021-09-28 ENCOUNTER — Inpatient Hospital Stay: Payer: Medicare Other

## 2021-09-28 VITALS — BP 154/98 | HR 83 | Temp 96.6°F | Resp 20

## 2021-09-28 DIAGNOSIS — D509 Iron deficiency anemia, unspecified: Secondary | ICD-10-CM | POA: Diagnosis not present

## 2021-09-28 DIAGNOSIS — D500 Iron deficiency anemia secondary to blood loss (chronic): Secondary | ICD-10-CM

## 2021-09-28 MED ORDER — LORATADINE 10 MG PO TABS
10.0000 mg | ORAL_TABLET | Freq: Once | ORAL | Status: AC
  Administered 2021-09-28: 10 mg via ORAL
  Filled 2021-09-28: qty 1

## 2021-09-28 MED ORDER — ALBUTEROL SULFATE HFA 108 (90 BASE) MCG/ACT IN AERS
2.0000 | INHALATION_SPRAY | Freq: Four times a day (QID) | RESPIRATORY_TRACT | Status: DC
  Administered 2021-09-28: 2 via RESPIRATORY_TRACT
  Filled 2021-09-28: qty 6.7

## 2021-09-28 MED ORDER — ACETAMINOPHEN 325 MG PO TABS
650.0000 mg | ORAL_TABLET | Freq: Once | ORAL | Status: AC
  Administered 2021-09-28: 650 mg via ORAL
  Filled 2021-09-28: qty 2

## 2021-09-28 MED ORDER — SODIUM CHLORIDE 0.9 % IV SOLN
300.0000 mg | Freq: Once | INTRAVENOUS | Status: AC
  Administered 2021-09-28: 300 mg via INTRAVENOUS
  Filled 2021-09-28: qty 300

## 2021-09-28 MED ORDER — SODIUM CHLORIDE 0.9 % IV SOLN: Freq: Once | INTRAVENOUS | Status: AC

## 2021-09-28 NOTE — Progress Notes (Signed)
Patient has not taken his blood pressure medication yet today.   Patient presented to clinic today with heavy breathing. Patient walked up to clinic, he had forgotten his inhaler today. After a period of time , he is still having heavy breathing, sats are good. Will order an inhaler per MD.

## 2021-09-28 NOTE — Patient Instructions (Signed)
[facility]  Discharge Instructions: Thank you for choosing [facility] to provide your oncology and hematology care.  If you have a lab appointment with the Cancer Center, please come in thru the Main Entrance and check in at the main information desk.  Wear comfortable clothing and clothing appropriate for easy access to any Portacath or PICC line.   We strive to give you quality time with your provider. You may need to reschedule your appointment if you arrive late (15 or more minutes).  Arriving late affects you and other patients whose appointments are after yours.  Also, if you miss three or more appointments without notifying the office, you may be dismissed from the clinic at the provider's discretion.      For prescription refill requests, have your pharmacy contact our office and allow 72 hours for refills to be completed.    Today you received the following chemotherapy and/or immunotherapy agents Venofer . Iron Sucrose Injection What is this medication? IRON SUCROSE (EYE ern SOO krose) treats low levels of iron (iron deficiency anemia) in people with kidney disease. Iron is a mineral that plays an important role in making red blood cells, which carry oxygen from your lungs to the rest of your body. This medicine may be used for other purposes; ask your health care provider or pharmacist if you have questions. COMMON BRAND NAME(S): Venofer What should I tell my care team before I take this medication? They need to know if you have any of these conditions: Anemia not caused by low iron levels Heart disease High levels of iron in the blood Kidney disease Liver disease An unusual or allergic reaction to iron, other medications, foods, dyes, or preservatives Pregnant or trying to get pregnant Breast-feeding How should I use this medication? This medication is for infusion into a vein. It is given in a hospital or clinic setting. Talk to your care  team about the use of this medication in children. While this medication may be prescribed for children as young as 2 years for selected conditions, precautions do apply. Overdosage: If you think you have taken too much of this medicine contact a poison control center or emergency room at once. NOTE: This medicine is only for you. Do not share this medicine with others. What if I miss a dose? It is important not to miss your dose. Call your care team if you are unable to keep an appointment. What may interact with this medication? Do not take this medication with any of the following: Deferoxamine Dimercaprol Other iron products This medication may also interact with the following: Chloramphenicol Deferasirox This list may not describe all possible interactions. Give your health care provider a list of all the medicines, herbs, non-prescription drugs, or dietary supplements you use. Also tell them if you smoke, drink alcohol, or use illegal drugs. Some items may interact with your medicine. What should I watch for while using this medication? Visit your care team regularly. Tell your care team if your symptoms do not start to get better or if they get worse. You may need blood work done while you are taking this medication. You may need to follow a special diet. Talk to your care team. Foods that contain iron include: whole grains/cereals, dried fruits, beans, or peas, leafy green vegetables, and organ meats (liver, kidney). What side effects may I notice from receiving this medication? Side effects that you should report to your care team as soon as   possible: Allergic reactions--skin rash, itching, hives, swelling of the face, lips, tongue, or throat Low blood pressure--dizziness, feeling faint or lightheaded, blurry vision Shortness of breath Side effects that usually do not require medical attention (report to your care team if they continue or are bothersome): Flushing Headache Joint  pain Muscle pain Nausea Pain, redness, or irritation at injection site This list may not describe all possible side effects. Call your doctor for medical advice about side effects. You may report side effects to FDA at 1-800-FDA-1088. Where should I keep my medication? This medication is given in a hospital or clinic and will not be stored at home. NOTE: This sheet is a summary. It may not cover all possible information. If you have questions about this medicine, talk to your doctor, pharmacist, or health care provider.  2023 Elsevier/Gold Standard (2007-03-10 00:00:00)       To help prevent nausea and vomiting after your treatment, we encourage you to take your nausea medication as directed.  BELOW ARE SYMPTOMS THAT SHOULD BE REPORTED IMMEDIATELY: *FEVER GREATER THAN 100.4 F (38 C) OR HIGHER *CHILLS OR SWEATING *NAUSEA AND VOMITING THAT IS NOT CONTROLLED WITH YOUR NAUSEA MEDICATION *UNUSUAL SHORTNESS OF BREATH *UNUSUAL BRUISING OR BLEEDING *URINARY PROBLEMS (pain or burning when urinating, or frequent urination) *BOWEL PROBLEMS (unusual diarrhea, constipation, pain near the anus) TENDERNESS IN MOUTH AND THROAT WITH OR WITHOUT PRESENCE OF ULCERS (sore throat, sores in mouth, or a toothache) UNUSUAL RASH, SWELLING OR PAIN  UNUSUAL VAGINAL DISCHARGE OR ITCHING   Items with * indicate a potential emergency and should be followed up as soon as possible or go to the Emergency Department if any problems should occur.  Please show the CHEMOTHERAPY ALERT CARD or IMMUNOTHERAPY ALERT CARD at check-in to the Emergency Department and triage nurse.  Should you have questions after your visit or need to cancel or reschedule your appointment, please contact [facility] [phone]  and follow the prompts.  Office hours are 8:00 a.m. to 4:30 p.m. Monday - Friday. Please note that voicemails left after 4:00 p.m. may not be returned until the following business day.  We are closed  weekends and major holidays. You have access to a nurse at all times for urgent questions. Please call the main number to the clinic [phone] and follow the prompts.  For any non-urgent questions, you may also contact your provider using MyChart. We now offer e-Visits for anyone 18 and older to request care online for non-urgent symptoms. For details visit [email or web address].   Also download the MyChart app! Go to the app store, search "MyChart", open the app, select , and log in with your MyChart username and password.  Masks are optional in the cancer centers. If you would like for your care team to wear a mask while they are taking care of you, please let them know. You may have one support person who is at least 67 years old accompany you for your appointments.  

## 2021-09-28 NOTE — Progress Notes (Signed)
Venofer given today per MD orders. Tolerated infusion without adverse affects. Vital signs stable. No complaints at this time. Discharged from clinic ambulatory in stable condition. Alert and oriented x 3. F/U with [facility] as scheduled.  Blood pressure elevated at discharge. MD aware. Verbal received to discharge patient.

## 2021-09-29 ENCOUNTER — Encounter (HOSPITAL_COMMUNITY): Payer: Self-pay | Admitting: Hematology

## 2021-10-12 ENCOUNTER — Other Ambulatory Visit (HOSPITAL_COMMUNITY): Payer: Self-pay | Admitting: Family Medicine

## 2021-10-12 DIAGNOSIS — R0600 Dyspnea, unspecified: Secondary | ICD-10-CM

## 2021-10-19 ENCOUNTER — Ambulatory Visit: Payer: Medicare Other | Admitting: Internal Medicine

## 2021-10-20 ENCOUNTER — Encounter (HOSPITAL_COMMUNITY): Payer: Self-pay | Admitting: *Deleted

## 2021-10-20 ENCOUNTER — Inpatient Hospital Stay (HOSPITAL_COMMUNITY)
Admission: EM | Admit: 2021-10-20 | Discharge: 2021-10-26 | DRG: 280 | Disposition: A | Discharge status: Home Health | Payer: Medicare Other | Attending: Internal Medicine | Admitting: Internal Medicine

## 2021-10-20 ENCOUNTER — Emergency Department (HOSPITAL_COMMUNITY): Payer: Medicare Other

## 2021-10-20 ENCOUNTER — Other Ambulatory Visit: Payer: Self-pay

## 2021-10-20 DIAGNOSIS — D631 Anemia in chronic kidney disease: Secondary | ICD-10-CM | POA: Diagnosis present

## 2021-10-20 DIAGNOSIS — I214 Non-ST elevation (NSTEMI) myocardial infarction: Secondary | ICD-10-CM

## 2021-10-20 DIAGNOSIS — I428 Other cardiomyopathies: Secondary | ICD-10-CM | POA: Diagnosis present

## 2021-10-20 DIAGNOSIS — E876 Hypokalemia: Secondary | ICD-10-CM | POA: Diagnosis present

## 2021-10-20 DIAGNOSIS — I5022 Chronic systolic (congestive) heart failure: Secondary | ICD-10-CM

## 2021-10-20 DIAGNOSIS — D509 Iron deficiency anemia, unspecified: Secondary | ICD-10-CM | POA: Diagnosis present

## 2021-10-20 DIAGNOSIS — I714 Abdominal aortic aneurysm, without rupture, unspecified: Secondary | ICD-10-CM | POA: Diagnosis present

## 2021-10-20 DIAGNOSIS — R7989 Other specified abnormal findings of blood chemistry: Secondary | ICD-10-CM | POA: Diagnosis not present

## 2021-10-20 DIAGNOSIS — Z87891 Personal history of nicotine dependence: Secondary | ICD-10-CM

## 2021-10-20 DIAGNOSIS — Z7902 Long term (current) use of antithrombotics/antiplatelets: Secondary | ICD-10-CM

## 2021-10-20 DIAGNOSIS — N183 Chronic kidney disease, stage 3 unspecified: Secondary | ICD-10-CM | POA: Diagnosis not present

## 2021-10-20 DIAGNOSIS — K6389 Other specified diseases of intestine: Secondary | ICD-10-CM | POA: Diagnosis not present

## 2021-10-20 DIAGNOSIS — Z85828 Personal history of other malignant neoplasm of skin: Secondary | ICD-10-CM

## 2021-10-20 DIAGNOSIS — L89891 Pressure ulcer of other site, stage 1: Secondary | ICD-10-CM | POA: Diagnosis present

## 2021-10-20 DIAGNOSIS — I2510 Atherosclerotic heart disease of native coronary artery without angina pectoris: Secondary | ICD-10-CM | POA: Diagnosis present

## 2021-10-20 DIAGNOSIS — Z823 Family history of stroke: Secondary | ICD-10-CM

## 2021-10-20 DIAGNOSIS — N179 Acute kidney failure, unspecified: Secondary | ICD-10-CM | POA: Diagnosis not present

## 2021-10-20 DIAGNOSIS — I10 Essential (primary) hypertension: Secondary | ICD-10-CM | POA: Diagnosis not present

## 2021-10-20 DIAGNOSIS — I700 Atherosclerosis of aorta: Secondary | ICD-10-CM | POA: Diagnosis present

## 2021-10-20 DIAGNOSIS — G9341 Metabolic encephalopathy: Secondary | ICD-10-CM | POA: Diagnosis not present

## 2021-10-20 DIAGNOSIS — R0602 Shortness of breath: Secondary | ICD-10-CM | POA: Diagnosis present

## 2021-10-20 DIAGNOSIS — I219 Acute myocardial infarction, unspecified: Secondary | ICD-10-CM | POA: Insufficient documentation

## 2021-10-20 DIAGNOSIS — I21A1 Myocardial infarction type 2: Secondary | ICD-10-CM | POA: Diagnosis present

## 2021-10-20 DIAGNOSIS — E1122 Type 2 diabetes mellitus with diabetic chronic kidney disease: Secondary | ICD-10-CM | POA: Diagnosis present

## 2021-10-20 DIAGNOSIS — I493 Ventricular premature depolarization: Secondary | ICD-10-CM | POA: Diagnosis present

## 2021-10-20 DIAGNOSIS — Z20822 Contact with and (suspected) exposure to covid-19: Secondary | ICD-10-CM | POA: Diagnosis present

## 2021-10-20 DIAGNOSIS — I779 Disorder of arteries and arterioles, unspecified: Secondary | ICD-10-CM | POA: Diagnosis present

## 2021-10-20 DIAGNOSIS — Z8546 Personal history of malignant neoplasm of prostate: Secondary | ICD-10-CM

## 2021-10-20 DIAGNOSIS — I252 Old myocardial infarction: Secondary | ICD-10-CM

## 2021-10-20 DIAGNOSIS — J9801 Acute bronchospasm: Secondary | ICD-10-CM | POA: Diagnosis present

## 2021-10-20 DIAGNOSIS — I6523 Occlusion and stenosis of bilateral carotid arteries: Secondary | ICD-10-CM | POA: Diagnosis not present

## 2021-10-20 DIAGNOSIS — E785 Hyperlipidemia, unspecified: Secondary | ICD-10-CM | POA: Diagnosis present

## 2021-10-20 DIAGNOSIS — L8990 Pressure ulcer of unspecified site, unspecified stage: Secondary | ICD-10-CM | POA: Insufficient documentation

## 2021-10-20 DIAGNOSIS — Z7982 Long term (current) use of aspirin: Secondary | ICD-10-CM

## 2021-10-20 DIAGNOSIS — J449 Chronic obstructive pulmonary disease, unspecified: Secondary | ICD-10-CM | POA: Diagnosis present

## 2021-10-20 DIAGNOSIS — I160 Hypertensive urgency: Secondary | ICD-10-CM | POA: Diagnosis present

## 2021-10-20 DIAGNOSIS — R7401 Elevation of levels of liver transaminase levels: Secondary | ICD-10-CM

## 2021-10-20 DIAGNOSIS — I5021 Acute systolic (congestive) heart failure: Secondary | ICD-10-CM | POA: Diagnosis not present

## 2021-10-20 DIAGNOSIS — I5043 Acute on chronic combined systolic (congestive) and diastolic (congestive) heart failure: Secondary | ICD-10-CM | POA: Diagnosis present

## 2021-10-20 DIAGNOSIS — Z8589 Personal history of malignant neoplasm of other organs and systems: Secondary | ICD-10-CM

## 2021-10-20 DIAGNOSIS — I509 Heart failure, unspecified: Principal | ICD-10-CM

## 2021-10-20 DIAGNOSIS — N1832 Chronic kidney disease, stage 3b: Secondary | ICD-10-CM | POA: Diagnosis present

## 2021-10-20 DIAGNOSIS — Z79899 Other long term (current) drug therapy: Secondary | ICD-10-CM

## 2021-10-20 DIAGNOSIS — N1831 Chronic kidney disease, stage 3a: Secondary | ICD-10-CM | POA: Diagnosis not present

## 2021-10-20 DIAGNOSIS — R778 Other specified abnormalities of plasma proteins: Secondary | ICD-10-CM | POA: Diagnosis present

## 2021-10-20 DIAGNOSIS — K761 Chronic passive congestion of liver: Secondary | ICD-10-CM | POA: Diagnosis present

## 2021-10-20 DIAGNOSIS — I130 Hypertensive heart and chronic kidney disease with heart failure and stage 1 through stage 4 chronic kidney disease, or unspecified chronic kidney disease: Principal | ICD-10-CM | POA: Diagnosis present

## 2021-10-20 DIAGNOSIS — I5042 Chronic combined systolic (congestive) and diastolic (congestive) heart failure: Secondary | ICD-10-CM

## 2021-10-20 DIAGNOSIS — R0609 Other forms of dyspnea: Secondary | ICD-10-CM | POA: Diagnosis not present

## 2021-10-20 LAB — COMPREHENSIVE METABOLIC PANEL
ALT: 278 U/L — ABNORMAL HIGH (ref 0–44)
AST: 237 U/L — ABNORMAL HIGH (ref 15–41)
Albumin: 3.5 g/dL (ref 3.5–5.0)
Alkaline Phosphatase: 150 U/L — ABNORMAL HIGH (ref 38–126)
Anion gap: 9 (ref 5–15)
BUN: 31 mg/dL — ABNORMAL HIGH (ref 8–23)
CO2: 22 mmol/L (ref 22–32)
Calcium: 8.6 mg/dL — ABNORMAL LOW (ref 8.9–10.3)
Chloride: 111 mmol/L (ref 98–111)
Creatinine, Ser: 1.98 mg/dL — ABNORMAL HIGH (ref 0.61–1.24)
GFR, Estimated: 36 mL/min — ABNORMAL LOW (ref >60)
Glucose, Bld: 130 mg/dL — ABNORMAL HIGH (ref 70–99)
Potassium: 3.2 mmol/L — ABNORMAL LOW (ref 3.5–5.1)
Sodium: 142 mmol/L (ref 135–145)
Total Bilirubin: 2.1 mg/dL — ABNORMAL HIGH (ref 0.3–1.2)
Total Protein: 6.8 g/dL (ref 6.5–8.1)

## 2021-10-20 LAB — CBC WITH DIFFERENTIAL/PLATELET
Abs Immature Granulocytes: 0.02 10*3/uL (ref 0.00–0.07)
Basophils Absolute: 0.1 10*3/uL (ref 0.0–0.1)
Basophils Relative: 1 %
Eosinophils Absolute: 0 10*3/uL (ref 0.0–0.5)
Eosinophils Relative: 0 %
HCT: 44.4 % (ref 39.0–52.0)
Hemoglobin: 13.1 g/dL (ref 13.0–17.0)
Immature Granulocytes: 0 %
Lymphocytes Relative: 10 %
Lymphs Abs: 0.8 10*3/uL (ref 0.7–4.0)
MCH: 24.9 pg — ABNORMAL LOW (ref 26.0–34.0)
MCHC: 29.5 g/dL — ABNORMAL LOW (ref 30.0–36.0)
MCV: 84.3 fL (ref 80.0–100.0)
Monocytes Absolute: 0.5 10*3/uL (ref 0.1–1.0)
Monocytes Relative: 7 %
Neutro Abs: 6.7 10*3/uL (ref 1.7–7.7)
Neutrophils Relative %: 82 %
Platelets: 294 10*3/uL (ref 150–400)
RBC: 5.27 MIL/uL (ref 4.22–5.81)
RDW: 23 % — ABNORMAL HIGH (ref 11.5–15.5)
WBC: 8.1 10*3/uL (ref 4.0–10.5)
nRBC: 0 % (ref 0.0–0.2)

## 2021-10-20 LAB — MRSA NEXT GEN BY PCR, NASAL: MRSA by PCR Next Gen: NOT DETECTED

## 2021-10-20 LAB — TROPONIN I (HIGH SENSITIVITY)
Troponin I (High Sensitivity): 450 ng/L (ref <18)
Troponin I (High Sensitivity): 418 ng/L (ref <18)

## 2021-10-20 LAB — HIV ANTIBODY (ROUTINE TESTING W REFLEX): HIV Screen 4th Generation wRfx: NONREACTIVE

## 2021-10-20 LAB — BRAIN NATRIURETIC PEPTIDE: B Natriuretic Peptide: >4500 pg/mL — ABNORMAL HIGH (ref 0.0–100.0)

## 2021-10-20 MED ORDER — HEPARIN SODIUM (PORCINE) 5000 UNIT/ML IJ SOLN
5000.0000 [IU] | Freq: Three times a day (TID) | INTRAMUSCULAR | Status: DC
  Administered 2021-10-22 – 2021-10-26 (×12): 5000 [IU] via SUBCUTANEOUS
  Filled 2021-10-20 (×14): qty 1

## 2021-10-20 MED ORDER — HYDRALAZINE HCL 20 MG/ML IJ SOLN
10.0000 mg | INTRAMUSCULAR | Status: DC | PRN
  Filled 2021-10-20: qty 1

## 2021-10-20 MED ORDER — CHLORHEXIDINE GLUCONATE CLOTH 2 % EX PADS
6.0000 | MEDICATED_PAD | Freq: Every day | CUTANEOUS | Status: DC
  Administered 2021-10-21: 6 via TOPICAL

## 2021-10-20 MED ORDER — SODIUM CHLORIDE 0.9% FLUSH: 3.0000 mL | INTRAVENOUS | Status: DC | PRN

## 2021-10-20 MED ORDER — ISOSORBIDE MONONITRATE ER 30 MG PO TB24
30.0000 mg | ORAL_TABLET | Freq: Every evening | ORAL | Status: DC
  Administered 2021-10-20 – 2021-10-25 (×6): 30 mg via ORAL
  Filled 2021-10-20 (×6): qty 1

## 2021-10-20 MED ORDER — CLOPIDOGREL BISULFATE 75 MG PO TABS
75.0000 mg | ORAL_TABLET | Freq: Every day | ORAL | Status: DC
  Administered 2021-10-21 – 2021-10-26 (×6): 75 mg via ORAL
  Filled 2021-10-20 (×6): qty 1

## 2021-10-20 MED ORDER — ASPIRIN 81 MG PO TBEC
81.0000 mg | DELAYED_RELEASE_TABLET | Freq: Every day | ORAL | Status: DC
  Administered 2021-10-21 – 2021-10-26 (×6): 81 mg via ORAL
  Filled 2021-10-20 (×6): qty 1

## 2021-10-20 MED ORDER — ONDANSETRON HCL 4 MG PO TABS: 4.0000 mg | ORAL_TABLET | Freq: Four times a day (QID) | ORAL | Status: DC | PRN

## 2021-10-20 MED ORDER — ACETAMINOPHEN 650 MG RE SUPP: 650.0000 mg | Freq: Four times a day (QID) | RECTAL | Status: DC | PRN

## 2021-10-20 MED ORDER — CARVEDILOL 25 MG PO TABS
25.0000 mg | ORAL_TABLET | Freq: Two times a day (BID) | ORAL | Status: DC
  Administered 2021-10-20 – 2021-10-24 (×7): 25 mg via ORAL
  Filled 2021-10-20 (×2): qty 1
  Filled 2021-10-20 (×2): qty 2
  Filled 2021-10-20 (×2): qty 1
  Filled 2021-10-20 (×2): qty 2

## 2021-10-20 MED ORDER — SODIUM CHLORIDE 0.9 % IV SOLN: 250.0000 mL | INTRAVENOUS | Status: DC | PRN

## 2021-10-20 MED ORDER — FUROSEMIDE 10 MG/ML IJ SOLN
80.0000 mg | Freq: Once | INTRAMUSCULAR | Status: AC
  Administered 2021-10-20: 80 mg via INTRAVENOUS
  Filled 2021-10-20: qty 8

## 2021-10-20 MED ORDER — ACETAMINOPHEN 325 MG PO TABS: 650.0000 mg | ORAL_TABLET | Freq: Four times a day (QID) | ORAL | Status: DC | PRN

## 2021-10-20 MED ORDER — ALBUTEROL SULFATE HFA 108 (90 BASE) MCG/ACT IN AERS
2.0000 | INHALATION_SPRAY | RESPIRATORY_TRACT | Status: DC | PRN
  Administered 2021-10-20: 2 via RESPIRATORY_TRACT
  Filled 2021-10-20: qty 6.7

## 2021-10-20 MED ORDER — FUROSEMIDE 10 MG/ML IJ SOLN
80.0000 mg | Freq: Two times a day (BID) | INTRAMUSCULAR | Status: DC
  Administered 2021-10-20 – 2021-10-21 (×2): 80 mg via INTRAVENOUS
  Filled 2021-10-20 (×2): qty 8

## 2021-10-20 MED ORDER — ONDANSETRON HCL 4 MG/2ML IJ SOLN: 4.0000 mg | Freq: Four times a day (QID) | INTRAMUSCULAR | Status: DC | PRN

## 2021-10-20 MED ORDER — METOPROLOL SUCCINATE ER 25 MG PO TB24
25.0000 mg | ORAL_TABLET | Freq: Every day | ORAL | Status: DC
  Administered 2021-10-20: 25 mg via ORAL
  Filled 2021-10-20: qty 1

## 2021-10-20 MED ORDER — SODIUM CHLORIDE 0.9% FLUSH
3.0000 mL | Freq: Two times a day (BID) | INTRAVENOUS | Status: DC
  Administered 2021-10-20 – 2021-10-25 (×7): 3 mL via INTRAVENOUS

## 2021-10-20 MED ORDER — AMLODIPINE BESYLATE 5 MG PO TABS
5.0000 mg | ORAL_TABLET | Freq: Every day | ORAL | Status: DC
  Administered 2021-10-20 – 2021-10-26 (×7): 5 mg via ORAL
  Filled 2021-10-20 (×7): qty 1

## 2021-10-20 NOTE — ED Triage Notes (Signed)
Pt c/o sob x one week with worsening with exertion; pt states he does have a non-productive cough and some chest pain

## 2021-10-20 NOTE — H&P (Signed)
History and Physical    Michael [name] XTG:[id number] DOB: 03-15-1955 DOA: 10/20/2021  PCP: [name], MD   Patient coming from: Home  Chief Complaint: Dyspnea  HPI: Michael [name] is a 66 y.o. male with medical history significant for carotid artery disease, COPD, CAD, CKD stage IIIb, hypertension, prior head and neck cancer, and chronic diastolic/systolic CHF who presented with complaints of dyspnea which has been intermittent over the past 2 weeks since his surgery.  He claims to have some orthopnea and states that it is worse when he lays down at night.  He denies any abdominal pain, nausea, vomiting, or diarrhea.  No fevers or chills noted.  He had prior colonic mass removal in 07/2021.   ED Course: Vital signs with elevated blood pressures noted and patient given Lasix as well as blood pressure agents in the ED.  Troponin elevation of 450 noted as well as transaminitis with AST 237 and ALT 278.  Alkaline phosphatase 115 bili 2.1.  Creatinine 1.98 and near baseline.  Chest x-ray with CHF/edema noted.  EKG with no significant ST changes.  CT of the abdomen and pelvis with no acute findings aside from right greater than left pleural effusions as well as large gallstone with no findings of acute cholecystitis.  Review of Systems: Reviewed as noted above, otherwise negative.  Past Medical History:  Diagnosis Date   Aortic atherosclerosis ([place]) 08/03/2021   CAD (coronary artery disease) 08/03/2021   Nuclear stress test 10/19/2017: Inferior/septal myocardial infarction with mild peri-infarct ischemia, EF 33 Med Rx - no symptoms   Carotid artery disease (HCC)    Chronic diastolic heart failure (HCC)    CKD (chronic kidney disease) stage 3, GFR 30-59 ml/min (HCC)    COPD (chronic obstructive pulmonary disease) ([place])    Dysrhythmia    Essential hypertension    Head and neck cancer ([place]) 2019   Right facial basal cell carcinoma s/p resection with right partial mastectomy and partal rhinectomy  with skin graft (06/13/17)   Iron deficiency anemia    Urinary retention     Past Surgical History:  Procedure Laterality Date   ANKLE CLOSED REDUCTION Right    open reduction   BASAL CELL CARCINOMA EXCISION  2019   at [facility]   BIOPSY  07/19/2021   Procedure: BIOPSY;  Surgeon: Eloise Harman, DO;  [facility];  Service: Endoscopy;;   COLONOSCOPY N/A 05/31/2017   Procedure: COLONOSCOPY;  Surgeon: Daneil Dolin, MD;  [facility];  Service: Endoscopy;  Laterality: N/A;  2:45pm   COLONOSCOPY WITH PROPOFOL N/A 07/19/2021   Procedure: COLONOSCOPY WITH PROPOFOL;  Surgeon: Eloise Harman, DO;  [facility];  Service: Endoscopy;  Laterality: N/A;  3:00pm, moved up to 9:00   CYSTOSCOPY N/A 10/29/2018   Procedure: CYSTOSCOPY, CLOT EVACUATION;  Surgeon: Ceasar Mons, MD;  Location: [facility];  Service: Urology;  Laterality: N/A;   PARTIAL COLECTOMY Right 08/11/2021   Procedure: PARTIAL COLECTOMY, OPEN RIGHT HEMICOLECTOMY;  Surgeon: Virl Cagey, MD;  Location: [facility];  Service: General;  Laterality: Right;   POLYPECTOMY  05/31/2017   Procedure: POLYPECTOMY;  Surgeon: Daneil Dolin, MD;  [facility];  Service: Endoscopy;;   TONSILLECTOMY     TRANSCAROTID ARTERY REVASCULARIZATION  Right 02/15/2021   Procedure: RIGHT TRANSCAROTID ARTERY REVASCULARIZATION;  Surgeon: [name] Heck, MD;  Location: [place];  Service: Vascular;  Laterality: Right;   ULTRASOUND GUIDANCE FOR VASCULAR ACCESS Left 02/15/2021  Procedure: ULTRASOUND GUIDANCE FOR VASCULAR ACCESS;  Surgeon: [name] Heck, MD;  Location: [place];  Service: Vascular;  Laterality: Left;   XI ROBOTIC ASSISTED SIMPLE PROSTATECTOMY N/A 10/29/2018   Procedure: XI ROBOTIC ASSISTED SIMPLE PROSTATECTOMY;  Surgeon: Cleon Gustin, MD;  Location: [facility];  Service: Urology;  Laterality: N/A;     reports that he quit smoking about 3 months ago. His smoking use included cigarettes. He  has a 20.00 pack-year smoking history. He has never been exposed to tobacco smoke. He has never used smokeless tobacco. He reports that he does not currently use alcohol. He reports that he does not use drugs.  No Known Allergies  Family History  Problem Relation Age of Onset   Stroke Father    Cirrhosis Mother    Colon cancer Neg Hx     Prior to Admission medications   Medication Sig Start Date End Date Taking? Authorizing Provider  albuterol (VENTOLIN HFA) 108 (90 Base) MCG/ACT inhaler Inhale 2 puffs into the lungs every 6 (six) hours as needed for wheezing or shortness of breath. 08/14/21  Yes Pappayliou, Barnetta Chapel A, DO  amLODipine (NORVASC) 5 MG tablet Take 5 mg by mouth daily. 10/12/21  Yes [provider]  aspirin EC 81 MG tablet Take 81 mg by mouth daily.   Yes [provider]  atorvastatin (LIPITOR) 80 MG tablet Take 1 tablet (80 mg total) by mouth every evening. 08/10/21  Yes Swinyer, Lanice Schwab, NP  isosorbide mononitrate (IMDUR) 30 MG 24 hr tablet Take 1 tablet (30 mg total) by mouth every evening. 08/10/21  Yes Swinyer, Lanice Schwab, NP  metoprolol succinate (TOPROL-XL) 25 MG 24 hr tablet Take 1 tablet (25 mg total) by mouth every evening. 08/10/21  Yes Swinyer, Lanice Schwab, NP  potassium chloride (KLOR-CON) 10 MEQ tablet Take 1 tablet (10 mEq total) by mouth daily. 08/10/21  Yes Swinyer, Lanice Schwab, NP  clopidogrel (PLAVIX) 75 MG tablet Take 1 tablet (75 mg total) by mouth daily. Patient not taking: Reported on 10/20/2021 05/19/21   Satira Sark, MD  ergocalciferol (VITAMIN D2) 1.25 MG (50000 UT) capsule Take 1 capsule (50,000 Units total) by mouth once a week. Patient not taking: Reported on 10/20/2021 08/26/21   Harriett Rush, PA-C  Evolocumab (REPATHA SURECLICK) 440 MG/ML SOAJ Inject 1 pen into the skin every 14 (fourteen) days. Patient not taking: Reported on 10/20/2021 03/09/21   Satira Sark, MD  furosemide (LASIX) 40 MG tablet 1 tablet Orally every  evening Patient not taking: Reported on 10/20/2021    [provider]  ondansetron (ZOFRAN) 4 MG tablet Take 1 tablet (4 mg total) by mouth every 6 (six) hours as needed for nausea. Patient not taking: Reported on 10/20/2021 08/14/21   Pappayliou, Barnetta Chapel A, DO  oxybutynin (DITROPAN) 5 MG tablet 1 tablet Orally three times a day Patient not taking: Reported on 10/20/2021    [provider]  trimethoprim (TRIMPEX) 100 MG tablet Take 1 tablet (100 mg total) by mouth at bedtime. Patient not taking: Reported on 10/20/2021 06/23/21   Cleon Gustin, MD    Physical Exam: Vitals:   10/20/21 1236 10/20/21 1300 10/20/21 1335 10/20/21 1345  BP: (!) 202/84 (!) 190/114 (!) 189/122   Pulse: 94 94 94   Resp: 15 (!) 26 13   Temp:    97.8 F (36.6 C)  TempSrc:    Axillary  SpO2: 91% 98% 93%   Weight:      Height:  Constitutional: NAD, calm, comfortable Vitals:   10/20/21 1236 10/20/21 1300 10/20/21 1335 10/20/21 1345  BP: (!) 202/84 (!) 190/114 (!) 189/122   Pulse: 94 94 94   Resp: 15 (!) 26 13   Temp:    97.8 F (36.6 C)  TempSrc:    Axillary  SpO2: 91% 98% 93%   Weight:      Height:       Eyes: lids and conjunctivae normal Neck: normal, supple Respiratory: clear to auscultation bilaterally. Normal respiratory effort. No accessory muscle use.  Cardiovascular: Regular rate and rhythm, no murmurs. Abdomen: no tenderness, no distention. Bowel sounds positive.  Musculoskeletal: 1+ bilateral ankle edema Skin: no rashes, lesions, ulcers.  Psychiatric: Flat affect  Labs on Admission: I have personally reviewed following labs and imaging studies  CBC: Recent Labs  Lab 10/20/21 1023  WBC 8.1  NEUTROABS 6.7  HGB 13.1  HCT 44.4  MCV 84.3  PLT 315   Basic Metabolic Panel: Recent Labs  Lab 10/20/21 1023  NA 142  K 3.2*  CL 111  CO2 22  GLUCOSE 130*  BUN 31*  CREATININE 1.98*  CALCIUM 8.6*   GFR: Estimated Creatinine Clearance: 38.8 mL/min (A) (by  C-G formula based on SCr of 1.98 mg/dL (H)). Liver Function Tests: Recent Labs  Lab 10/20/21 1023  AST 237*  ALT 278*  ALKPHOS 150*  BILITOT 2.1*  PROT 6.8  ALBUMIN 3.5   No results for input(s): "LIPASE", "AMYLASE" in the last 168 hours. No results for input(s): "AMMONIA" in the last 168 hours. Coagulation Profile: No results for input(s): "INR", "PROTIME" in the last 168 hours. Cardiac Enzymes: No results for input(s): "CKTOTAL", "CKMB", "CKMBINDEX", "TROPONINI" in the last 168 hours. BNP (last 3 results) No results for input(s): "PROBNP" in the last 8760 hours. HbA1C: No results for input(s): "HGBA1C" in the last 72 hours. CBG: No results for input(s): "GLUCAP" in the last 168 hours. Lipid Profile: No results for input(s): "CHOL", "HDL", "LDLCALC", "TRIG", "CHOLHDL", "LDLDIRECT" in the last 72 hours. Thyroid Function Tests: No results for input(s): "TSH", "T4TOTAL", "FREET4", "T3FREE", "THYROIDAB" in the last 72 hours. Anemia Panel: No results for input(s): "VITAMINB12", "FOLATE", "FERRITIN", "TIBC", "IRON", "RETICCTPCT" in the last 72 hours. Urine analysis:    Component Value Date/Time   COLORURINE YELLOW 02/09/2021 1348   APPEARANCEUR Cloudy (A) 06/16/2021 0936   LABSPEC 1.025 02/09/2021 1348   LABSPEC 1.018 04/23/2011 1044   PHURINE 6.0 02/09/2021 1348   GLUCOSEU Negative 06/16/2021 0936   GLUCOSEU Negative 04/23/2011 1044   HGBUR TRACE (A) 02/09/2021 1348   BILIRUBINUR Negative 06/16/2021 0936   BILIRUBINUR Negative 04/23/2011 1044   KETONESUR NEGATIVE 02/09/2021 1348   PROTEINUR Trace (A) 06/16/2021 0936   PROTEINUR NEGATIVE 02/09/2021 1348   NITRITE Positive (A) 06/16/2021 0936   NITRITE POSITIVE (A) 02/09/2021 1348   LEUKOCYTESUR 2+ (A) 06/16/2021 0936   LEUKOCYTESUR SMALL (A) 02/09/2021 1348   LEUKOCYTESUR Trace 04/23/2011 1044    Radiological Exams on Admission: CT Abdomen Pelvis Wo Contrast  Result Date: 10/20/2021 CLINICAL DATA:  Colorectal  carcinoma. Elevated liver function. Metastatic disease evaluation. * Tracking Code: BO * EXAM: CT ABDOMEN AND PELVIS WITHOUT CONTRAST TECHNIQUE: Multidetector CT imaging of the abdomen and pelvis was performed following the standard protocol without IV contrast. RADIATION DOSE REDUCTION: This exam was performed according to the departmental dose-optimization program which includes automated exposure control, adjustment of the mA and/or kV according to patient size and/or use of iterative reconstruction technique. COMPARISON:  CT  07/05/2021, MRI 08/06/2021 FINDINGS: Lower chest: Moderate RIGHT pleural effusion and small LEFT pleural effusion. Hepatobiliary: No suspicious lesion within liver with limitations of noncontrast exam. Simple cyst within the central liver unchanged from comparison CT. Large periphery calcified gallstone within the lumen the gallbladder. No acute inflammation. Pancreas: Coarse calcifications through the distal pancreas leading up to the head of the pancreas. Stable duct dilatation in tail the pancreas. No change from comparison MRI or CT. Spleen: Normal spleen Adrenals/urinary tract: Adrenal glands and kidneys are normal. The ureters and bladder normal. Stomach/Bowel: Stomach, duodenum small-bowel normal. Post RIGHT hemicolectomy. LEFT colon normal. Vascular/Lymphatic: Calcification abdominal aorta. Fusiform dilatation of the for abdominal aorta to 3.1 cm. Reproductive: TURP defect within the prostate Other: Midline surgical wound with mild thickening at the skin surface measuring 13 mm. Musculoskeletal: No aggressive osseous lesion. IMPRESSION: 1. New bilateral small moderate pleural effusions, RIGHT greater than LEFT. 2. No evidence of hepatic metastasis on noncontrast CT. 3. Large gallstone without evidence acute cholecystitis. 4. Post RIGHT hemicolectomy without complicating features. 5. Thickening tissue at the skin surface along the ventral abdominal wall is likely scar tissue. 6. 3.1  cm abdominal aortic aneurysm. Recommend follow-up every 3 years. Reference: J Am Coll Radiol 9357;01:[id number]. Electronically Signed   By: Suzy Bouchard M.D.   On: 10/20/2021 14:17   DG Chest Port 1 View  Result Date: 10/20/2021 CLINICAL DATA:  Shortness of breath EXAM: PORTABLE CHEST 1 VIEW COMPARISON:  08/13/2021 FINDINGS: Stable cardiomediastinal contours. Pulmonary vascular congestion with diffusely prominent interstitial markings bilaterally. No appreciable pleural fluid collection. No pneumothorax. IMPRESSION: Findings of CHF with interstitial edema. Electronically Signed   By: Davina Poke D.O.   On: 10/20/2021 11:02    EKG: Independently reviewed. 98bpm, LVH; non-specific ST changes.  Assessment/Plan Principal Problem:   NSTEMI (non-ST elevated myocardial infarction) ([place]) Active Problems:   Essential hypertension   Elevated troponin   Iron (Fe) deficiency anemia   Carotid artery disease (HCC)   CAD (coronary artery disease)   Stage 3a chronic kidney disease (HCC)   Colonic mass    Acute on chronic systolic/diastolic heart failure exacerbation Likely related to hypertensive urgency Continue IV Lasix 80 mg twice daily Repeat 2D echocardiogram Carvedilol  Hypertensive urgency Currently quite elevated, resume home medications and consider nitroglycerin drip as needed Continue Coreg Continue amlodipine and Imdur Hydralazine as needed  Elevated troponin  Possibly related to above versus NSTEMI Noted follow repeat troponin 2D echocardiogram Appreciate cardiology recommendations Carvedilol 25 mg twice daily  Transaminitis CT abdomen with large gallstone but no findings of cholecystitis or biliary obstruction Obtain acute hepatitis panel Continue to monitor closely  CKD stage IIIb Creatinine currently at baseline, monitor with diuresis  History of COPD Note acute bronchospasms currently noted, DuoNebs as needed  Recent colonic mass removal 7/23 High-grade  dysplasia noted on pathology report with no invasive carcinoma  Mild hypokalemia Replete and monitor with diuresis  Carotid artery disease Prior right carotid severe stenosis with TCAR  Dyslipidemia Currently on Repatha with LDL 64  Prior head neck cancer with resection   DVT prophylaxis: Heparin Code Status: Full Family Communication: None at bedside Disposition Plan:Admit for trop elevation and CHF Consults called:Cardiology Admission status: Inpatient, SDU  Severity of Illness: The appropriate patient status for this patient is INPATIENT. Inpatient status is judged to be reasonable and necessary in order to provide the required intensity of service to ensure the patient's safety. The patient's presenting symptoms, physical exam findings, and initial radiographic and laboratory  data in the context of their chronic comorbidities is felt to place them at high risk for further clinical deterioration. Furthermore, it is not anticipated that the patient will be medically stable for discharge from the hospital within 2 midnights of admission.   * I certify that at the point of admission it is my clinical judgment that the patient will require inpatient hospital care spanning beyond 2 midnights from the point of admission due to high intensity of service, high risk for further deterioration and high frequency of surveillance required.*   Cru Kritikos D Eileen Kangas DO [facility]  If 7PM-7AM, please contact night-coverage [email or web address]  10/20/2021, 2:54 PM

## 2021-10-20 NOTE — Consult Note (Signed)
Cardiology Consultation   Patient ID: Michael [name] MRN: [id number]; DOB: [birth year]  Admit date: 10/20/2021 Date of Consult: 10/20/2021  PCP:  [name], [facility] [facility] Cardiologist:  Rozann Lesches, MD        Patient Profile:   Michael [name] is a 66 y.o. male with a hx of carotid artery disease who is being seen 10/20/2021 for the evaluation of elevated troponin, shortness of breath, acute systolic heart failure at the request of Dr. Rogene Houston.  History of Present Illness:   Mr. Monforte is a 66 year old male previously seen by Dr. Domenic Polite with carotid artery disease followed by vascular surgery post TCAR for asymptomatic high-grade right internal carotid artery stenosis, COPD, with prior history of coronary artery disease, chronic kidney disease stage IIIb here for the evaluation of shortness of breath.  Previously an echocardiogram in 2019 showed EF of 45 to 50% mildly reduced with mild LVH, mild mitral regurgitation, mildly dilated left atrium.  Previous noninvasive cardiac imaging with Myoview in 2019 as follows: Findings consistent with prior inferior/septal myocardial infarction with mild peri-infarct ischemia. This is a high risk study. Risk primarily based on decreased LVEF. There is a prior infarct with only mild current ischemia. Consider correlating LVEF with echocardiogram. The left ventricular ejection fraction is moderately decreased (33%).   He has not been complaining of any chest discomfort or anginal type symptoms.  His main symptoms are shortness of breath which has been waxing and waning over the past 2 weeks since his surgery.  He states that it is worse when he tries to lay down at night.  No melena, no hematemesis, no abdominal discomfort currently.  Visibly short of breath.  His lab work demonstrated a troponin of 450.  He also has an elevated alkaline phosphatase of 150, AST of 237, ALT of 278 elevated bilirubin of 2.1.  Had prior  colonic mass removal in July 2023.  Creatinine remains fairly stable at 1.98.  Prior LDL 64  Past Medical History:  Diagnosis Date   Aortic atherosclerosis ([place]) 08/03/2021   CAD (coronary artery disease) 08/03/2021   Nuclear stress test 10/19/2017: Inferior/septal myocardial infarction with mild peri-infarct ischemia, EF 33 Med Rx - no symptoms   Carotid artery disease (HCC)    Chronic diastolic heart failure (HCC)    CKD (chronic kidney disease) stage 3, GFR 30-59 ml/min (HCC)    COPD (chronic obstructive pulmonary disease) ([place])    Dysrhythmia    Essential hypertension    Head and neck cancer ([place]) 2019   Right facial basal cell carcinoma s/p resection with right partial mastectomy and partal rhinectomy with skin graft (06/13/17)   Iron deficiency anemia    Urinary retention     Past Surgical History:  Procedure Laterality Date   ANKLE CLOSED REDUCTION Right    open reduction   BASAL CELL CARCINOMA EXCISION  2019   at [facility]   BIOPSY  07/19/2021   Procedure: BIOPSY;  Surgeon: Eloise Harman, DO;  [facility];  Service: Endoscopy;;   COLONOSCOPY N/A 05/31/2017   Procedure: COLONOSCOPY;  Surgeon: Daneil Dolin, MD;  [facility];  Service: Endoscopy;  Laterality: N/A;  2:45pm   COLONOSCOPY WITH PROPOFOL N/A 07/19/2021   Procedure: COLONOSCOPY WITH PROPOFOL;  Surgeon: Eloise Harman, DO;  [facility];  Service: Endoscopy;  Laterality: N/A;  3:00pm, moved up to 9:00   CYSTOSCOPY N/A 10/29/2018   Procedure: CYSTOSCOPY, CLOT EVACUATION;  Surgeon: Ceasar Mons, MD;  Location: [facility];  Service: Urology;  Laterality: N/A;   PARTIAL COLECTOMY Right 08/11/2021   Procedure: PARTIAL COLECTOMY, OPEN RIGHT HEMICOLECTOMY;  Surgeon: Virl Cagey, MD;  Location: [facility];  Service: General;  Laterality: Right;   POLYPECTOMY  05/31/2017   Procedure: POLYPECTOMY;  Surgeon: Daneil Dolin, MD;  [facility];  Service: Endoscopy;;    TONSILLECTOMY     TRANSCAROTID ARTERY REVASCULARIZATION  Right 02/15/2021   Procedure: RIGHT TRANSCAROTID ARTERY REVASCULARIZATION;  Surgeon: [name] Heck, MD;  Location: [place];  Service: Vascular;  Laterality: Right;   ULTRASOUND GUIDANCE FOR VASCULAR ACCESS Left 02/15/2021   Procedure: ULTRASOUND GUIDANCE FOR VASCULAR ACCESS;  Surgeon: [name] Heck, MD;  Location: [place];  Service: Vascular;  Laterality: Left;   XI ROBOTIC ASSISTED SIMPLE PROSTATECTOMY N/A 10/29/2018   Procedure: XI ROBOTIC ASSISTED SIMPLE PROSTATECTOMY;  Surgeon: Cleon Gustin, MD;  Location: [facility];  Service: Urology;  Laterality: N/A;     Home Medications:  Prior to Admission medications   Medication Sig Start Date End Date Taking? Authorizing Provider  albuterol (VENTOLIN HFA) 108 (90 Base) MCG/ACT inhaler Inhale 2 puffs into the lungs every 6 (six) hours as needed for wheezing or shortness of breath. 08/14/21  Yes Pappayliou, Barnetta Chapel A, DO  amLODipine (NORVASC) 5 MG tablet Take 5 mg by mouth daily. 10/12/21  Yes [provider]  aspirin EC 81 MG tablet Take 81 mg by mouth daily.   Yes [provider]  atorvastatin (LIPITOR) 80 MG tablet Take 1 tablet (80 mg total) by mouth every evening. 08/10/21  Yes Swinyer, Lanice Schwab, NP  isosorbide mononitrate (IMDUR) 30 MG 24 hr tablet Take 1 tablet (30 mg total) by mouth every evening. 08/10/21  Yes Swinyer, Lanice Schwab, NP  metoprolol succinate (TOPROL-XL) 25 MG 24 hr tablet Take 1 tablet (25 mg total) by mouth every evening. 08/10/21  Yes Swinyer, Lanice Schwab, NP  potassium chloride (KLOR-CON) 10 MEQ tablet Take 1 tablet (10 mEq total) by mouth daily. 08/10/21  Yes Swinyer, Lanice Schwab, NP  clopidogrel (PLAVIX) 75 MG tablet Take 1 tablet (75 mg total) by mouth daily. Patient not taking: Reported on 10/20/2021 05/19/21   Satira Sark, MD  ergocalciferol (VITAMIN D2) 1.25 MG (50000 UT) capsule Take 1 capsule (50,000 Units total) by mouth once a  week. Patient not taking: Reported on 10/20/2021 08/26/21   Harriett Rush, PA-C  Evolocumab (REPATHA SURECLICK) 408 MG/ML SOAJ Inject 1 pen into the skin every 14 (fourteen) days. Patient not taking: Reported on 10/20/2021 03/09/21   Satira Sark, MD  furosemide (LASIX) 40 MG tablet 1 tablet Orally every evening Patient not taking: Reported on 10/20/2021    [provider]  ondansetron (ZOFRAN) 4 MG tablet Take 1 tablet (4 mg total) by mouth every 6 (six) hours as needed for nausea. Patient not taking: Reported on 10/20/2021 08/14/21   Pappayliou, Barnetta Chapel A, DO  oxybutynin (DITROPAN) 5 MG tablet 1 tablet Orally three times a day Patient not taking: Reported on 10/20/2021    [provider]  trimethoprim (TRIMPEX) 100 MG tablet Take 1 tablet (100 mg total) by mouth at bedtime. Patient not taking: Reported on 10/20/2021 06/23/21   Cleon Gustin, MD    Inpatient Medications: Scheduled Meds:  carvedilol  25 mg Oral BID WC   metoprolol succinate  25 mg Oral Daily   Continuous Infusions:  PRN Meds: albuterol  Allergies:  No Known Allergies  Social History:   Social History   Socioeconomic History   Marital status: Married    Spouse name: Not on file   Number of children: Not on file   Years of education: Not on file   Highest education level: Not on file  Occupational History   Not on file  Tobacco Use   Smoking status: Former    Packs/day: 0.50    Years: 40.00    Total pack years: 20.00    Types: Cigarettes    Quit date: 07/02/2021    Years since quitting: 0.3    Passive exposure: Never   Smokeless tobacco: Never   Tobacco comments:    Quit on 06/09/21  Vaping Use   Vaping Use: Never used  Substance and Sexual Activity   Alcohol use: Not Currently   Drug use: Never   Sexual activity: Not Currently  Other Topics Concern   Not on file  Social History Narrative   Not on file   Social Determinants of Health   Financial Resource Strain:  Low Risk  (12/11/2019)   Overall Financial Resource Strain (CARDIA)    Difficulty of Paying Living Expenses: Not hard at all  Food Insecurity: No Food Insecurity (12/11/2019)   Hunger Vital Sign    Worried About Running Out of Food in the Last Year: Never true    [place] in the Last Year: Never true  Transportation Needs: No Transportation Needs (12/11/2019)   PRAPARE - Hydrologist (Medical): No    Lack of Transportation (Non-Medical): No  Physical Activity: Inactive (12/11/2019)   Exercise Vital Sign    Days of Exercise per Week: 0 days    Minutes of Exercise per Session: 0 min  Stress: No Stress Concern Present (12/11/2019)   [place]    Feeling of Stress : Not at all  Social Connections: Socially Isolated (12/11/2019)   Social Connection and Isolation Panel [NHANES]    Frequency of Communication with Friends and Family: Never    Frequency of Social Gatherings with Friends and Family: Never    Attends Religious Services: Never    Marine scientist or Organizations: No    Attends Archivist Meetings: Never    Marital Status: Divorced  Human resources officer Violence: Not At Risk (12/11/2019)   Humiliation, Afraid, Rape, and Kick questionnaire    Fear of Current or Ex-Partner: No    Emotionally Abused: No    Physically Abused: No    Sexually Abused: No    Family History:    Family History  Problem Relation Age of Onset   Stroke Father    Cirrhosis Mother    Colon cancer Neg Hx      ROS:  Please see the history of present illness.   All other ROS reviewed and negative.     Physical Exam/Data:   Vitals:   10/20/21 1236 10/20/21 1300 10/20/21 1335 10/20/21 1345  BP: (!) 202/84 (!) 190/114 (!) 189/122   Pulse: 94 94 94   Resp: 15 (!) 26 13   Temp:    97.8 F (36.6 C)  TempSrc:    Axillary  SpO2: 91% 98% 93%   Weight:      Height:       No intake or  output data in the 24 hours ending 10/20/21 1418    10/20/2021   10:05 AM 09/15/2021   10:50  AM 08/26/2021    9:50 AM  Last 3 Weights  Weight (lbs) 167 lb 161 lb 154 lb 3.5 oz  Weight (kg) 75.751 kg 73.029 kg 69.954 kg     Body mass index is 22.65 kg/m.  General: Chronically ill-appearing, increased work of breathing HEENT: normal, right facial scars noted Neck: no JVD Vascular: No carotid bruits; Distal pulses 2+ bilaterally Cardiac:  normal S1, S2; irregular, frequent ectopy; no murmur  Lungs: Increased respiratory effort, no significant crackles heard  Abd: soft, nontender, no hepatomegaly, surgical scars noted Ext: 1+ bilateral lower extremity edema Musculoskeletal:  No deformities, BUE and BLE strength normal and equal Skin: warm and dry  Neuro:  CNs 2-12 intact, no focal abnormalities noted Psych:  Normal affect   EKG:  The EKG was personally reviewed and demonstrates: Sinus rhythm 98 poor R wave progression nonspecific ST-T wave changes, baseline artifact, LVH  Telemetry:  Telemetry was personally reviewed and demonstrates: Sinus rhythm with frequent PACs, possible multifocal atrial tachycardia  Relevant CV Studies: As above  Laboratory Data:  High Sensitivity Troponin:   Recent Labs  Lab 10/20/21 1132  TROPONINIHS 450*     Chemistry Recent Labs  Lab 10/20/21 1023  NA 142  K 3.2*  CL 111  CO2 22  GLUCOSE 130*  BUN 31*  CREATININE 1.98*  CALCIUM 8.6*  GFRNONAA 36*  ANIONGAP 9    Recent Labs  Lab 10/20/21 1023  PROT 6.8  ALBUMIN 3.5  AST 237*  ALT 278*  ALKPHOS 150*  BILITOT 2.1*   Lipids No results for input(s): "CHOL", "TRIG", "HDL", "LABVLDL", "LDLCALC", "CHOLHDL" in the last 168 hours.  Hematology Recent Labs  Lab 10/20/21 1023  WBC 8.1  RBC 5.27  HGB 13.1  HCT 44.4  MCV 84.3  MCH 24.9*  MCHC 29.5*  RDW 23.0*  PLT 294   Thyroid No results for input(s): "TSH", "FREET4" in the last 168 hours.  BNPNo results for input(s): "BNP",  "PROBNP" in the last 168 hours.  DDimer No results for input(s): "DDIMER" in the last 168 hours.   Radiology/Studies:  DG Chest Port 1 View  Result Date: 10/20/2021 CLINICAL DATA:  Shortness of breath EXAM: PORTABLE CHEST 1 VIEW COMPARISON:  08/13/2021 FINDINGS: Stable cardiomediastinal contours. Pulmonary vascular congestion with diffusely prominent interstitial markings bilaterally. No appreciable pleural fluid collection. No pneumothorax. IMPRESSION: Findings of CHF with interstitial edema. Electronically Signed   By: Davina Poke D.O.   On: 10/20/2021 11:02     Assessment and Plan:   Elevated troponin - Current high-sensitivity troponin at first check is 450.  He is not having any chest discomfort.  He did come in with shortness of breath in the setting of severely elevated blood pressure in the 443 systolic range.  This elevated troponin may be secondary to type II MI in the setting of acute diastolic/systolic heart failure with concomitant hypertensive urgency.  For now, I would hold off on heparin since he is not having any active chest discomfort. -Repeating high-sensitivity troponin for delta. -Interstitial edema noted on chest x-ray -Agree with IV Lasix. -Consider IV nitroglycerin to assist with blood pressure decreased as well.  Acute on chronic systolic/diastolic heart failure - In part secondary to hypertensive urgency as well.  Prior EF 45%. -Repeat echocardiogram -IV Lasix 80 mg twice daily -Carvedilol 25 mg twice a day has been ordered as inpatient. - Not likely on ARN I such as Entresto secondary to elevated creatinine.  Carotid artery disease - Status post  TCAR right carotid severe stenosis that was asymptomatic.  Hyperlipidemia - On Repatha excellent LDL.  64.  Elevated liver enzymes - 200 range AST ALT with mildly elevated bilirubin and alkaline phosphatase.  Could this be related to recent colonic mass removal in July?  Work-up per primary team. -Could this  also be related to hepatic congestion in the setting of heart failure?   Risk Assessment/Risk Scores:           For questions or updates, please contact [place] Please consult [email or web address] for contact info under    Signed, Candee Furbish, MD  10/20/2021 2:18 PM

## 2021-10-20 NOTE — ED Provider Notes (Addendum)
[facility] [place] Provider Note   CSN: [id number] Arrival date & time: 10/20/21  3614     History  Chief Complaint  Patient presents with   Shortness of Breath    Michael [name] is a 66 y.o. male.  Patient brought in by EMS patient with complaint of shortness of breath for 1 week worsening with exertion.  Patient states he does have a nonproductive cough with it and there is not any chest discomfort or chest pain.  Despite what they said out in triage have asked patient several times and he denies that.  Patient is on aspirin and Plavix routinely not on any blood thinners.  Patient does not use oxygen at home.  Past medical history significant for chronic diastolic heart failure COPD chronic kidney disease stage III coronary artery disease as of July 2023 with a nuclear stress test with inferior septal myocardial infarction with mild peri-infarct ischemia at that time.  History of aortic atherosclerosis.  Past surgical history significant for robotic assisted prostatectomy for prostate cancer surgery to the face for skin cancer with graft.  And most recently a partial colectomy on the right in July 2023 for neoplastic polyps.  Patient is a former smoker quit in July 2023.  Patient followed by the cancer center most recently syndrome seen by them on July 27.  He had his right hemicolectomy July 12 surgical pathology showed multiple tubular adenomas 7 which showed focal high-grade dysplasia but were negative for invasive carcinoma.  Hemicolectomy was done here at [facility].       Home Medications Prior to Admission medications   Medication Sig Start Date End Date Taking? Authorizing Provider  albuterol (VENTOLIN HFA) 108 (90 Base) MCG/ACT inhaler Inhale 2 puffs into the lungs every 6 (six) hours as needed for wheezing or shortness of breath. 08/14/21  Yes Pappayliou, Barnetta Chapel A, DO  amLODipine (NORVASC) 5 MG tablet Take 5 mg by mouth daily. 10/12/21  Yes [provider]   aspirin EC 81 MG tablet Take 81 mg by mouth daily.   Yes [provider]  atorvastatin (LIPITOR) 80 MG tablet Take 1 tablet (80 mg total) by mouth every evening. 08/10/21  Yes Swinyer, Lanice Schwab, NP  isosorbide mononitrate (IMDUR) 30 MG 24 hr tablet Take 1 tablet (30 mg total) by mouth every evening. 08/10/21  Yes Swinyer, Lanice Schwab, NP  metoprolol succinate (TOPROL-XL) 25 MG 24 hr tablet Take 1 tablet (25 mg total) by mouth every evening. 08/10/21  Yes Swinyer, Lanice Schwab, NP  potassium chloride (KLOR-CON) 10 MEQ tablet Take 1 tablet (10 mEq total) by mouth daily. 08/10/21  Yes Swinyer, Lanice Schwab, NP  clopidogrel (PLAVIX) 75 MG tablet Take 1 tablet (75 mg total) by mouth daily. Patient not taking: Reported on 10/20/2021 05/19/21   Satira Sark, MD  ergocalciferol (VITAMIN D2) 1.25 MG (50000 UT) capsule Take 1 capsule (50,000 Units total) by mouth once a week. Patient not taking: Reported on 10/20/2021 08/26/21   Harriett Rush, PA-C  Evolocumab (REPATHA SURECLICK) 431 MG/ML SOAJ Inject 1 pen into the skin every 14 (fourteen) days. Patient not taking: Reported on 10/20/2021 03/09/21   Satira Sark, MD  furosemide (LASIX) 40 MG tablet 1 tablet Orally every evening Patient not taking: Reported on 10/20/2021    [provider]  ondansetron (ZOFRAN) 4 MG tablet Take 1 tablet (4 mg total) by mouth every 6 (six) hours as needed for nausea. Patient not taking: Reported on 10/20/2021 08/14/21  Pappayliou, Catherine A, DO  oxybutynin (DITROPAN) 5 MG tablet 1 tablet Orally three times a day Patient not taking: Reported on 10/20/2021    [provider]  trimethoprim (TRIMPEX) 100 MG tablet Take 1 tablet (100 mg total) by mouth at bedtime. Patient not taking: Reported on 10/20/2021 06/23/21   Cleon Gustin, MD      Allergies    Patient has no known allergies.    Review of Systems   Review of Systems  Constitutional:  Negative for chills and fever.  HENT:   Negative for ear pain and sore throat.   Eyes:  Negative for pain and visual disturbance.  Respiratory:  Positive for shortness of breath. Negative for cough.   Cardiovascular:  Negative for chest pain and palpitations.  Gastrointestinal:  Negative for abdominal pain and vomiting.  Genitourinary:  Negative for dysuria and hematuria.  Musculoskeletal:  Negative for arthralgias and back pain.  Skin:  Negative for color change and rash.  Neurological:  Negative for seizures and syncope.  All other systems reviewed and are negative.   Physical Exam Updated Vital Signs BP (!) 178/151   Pulse 94   Temp 97.8 F (36.6 C) (Axillary)   Resp 17   Ht 1.829 m (6')   Wt 75.8 kg   SpO2 93%   BMI 22.65 kg/m  Physical Exam Vitals and nursing note reviewed.  Constitutional:      General: He is not in acute distress.    Appearance: Normal appearance. He is well-developed.  HENT:     Head: Normocephalic.     Comments: Patient with partial resection of the nose and a skin graft to the right cheek area from skin cancer.    Mouth/Throat:     Mouth: Mucous membranes are moist.  Eyes:     Conjunctiva/sclera: Conjunctivae normal.     Pupils: Pupils are equal, round, and reactive to light.  Cardiovascular:     Rate and Rhythm: Normal rate and regular rhythm.     Heart sounds: No murmur heard. Pulmonary:     Effort: Pulmonary effort is normal. No respiratory distress.     Breath sounds: Normal breath sounds. No stridor. No wheezing, rhonchi or rales.  Abdominal:     Palpations: Abdomen is soft.     Tenderness: There is no abdominal tenderness.  Musculoskeletal:        General: No swelling.     Cervical back: Neck supple.     Right lower leg: No edema.     Left lower leg: No edema.  Skin:    General: Skin is warm and dry.     Capillary Refill: Capillary refill takes less than 2 seconds.  Neurological:     General: No focal deficit present.     Mental Status: He is alert and oriented to  person, place, and time.  Psychiatric:        Mood and Affect: Mood normal.     ED Results / Procedures / Treatments   Labs (all labs ordered are listed, but only abnormal results are displayed) Labs Reviewed  CBC WITH DIFFERENTIAL/PLATELET - Abnormal; Notable for the following components:      Result Value   MCH 24.9 (*)    MCHC 29.5 (*)    RDW 23.0 (*)    All other components within normal limits  COMPREHENSIVE METABOLIC PANEL - Abnormal; Notable for the following components:   Potassium 3.2 (*)    Glucose, Bld 130 (*)  BUN 31 (*)    Creatinine, Ser 1.98 (*)    Calcium 8.6 (*)    AST 237 (*)    ALT 278 (*)    Alkaline Phosphatase 150 (*)    Total Bilirubin 2.1 (*)    GFR, Estimated 36 (*)    All other components within normal limits  BRAIN NATRIURETIC PEPTIDE - Abnormal; Notable for the following components:   B Natriuretic Peptide >4,500.0 (*)    All other components within normal limits  TROPONIN I (HIGH SENSITIVITY) - Abnormal; Notable for the following components:   Troponin I (High Sensitivity) 450 (*)    All other components within normal limits  TROPONIN I (HIGH SENSITIVITY) - Abnormal; Notable for the following components:   Troponin I (High Sensitivity) 418 (*)    All other components within normal limits  MRSA NEXT GEN BY PCR, NASAL  HIV ANTIBODY (ROUTINE TESTING W REFLEX)  HEPATITIS PANEL, ACUTE    EKG EKG Interpretation  Date/Time:  Wednesday October 20 2021 10:03:50 EDT Ventricular Rate:  98 PR Interval:  211 QRS Duration: 125 QT Interval:  393 QTC Calculation: 502 R Axis:   42 Text Interpretation: Sinus or ectopic atrial tachycardia Premature ventricular complexes Borderline prolonged PR interval LVH with secondary repolarization abnormality Anterior infarct, old Prolonged QT interval Confirmed by Fredia Sorrow [phone]) on 10/20/2021 10:08:52 AM  Radiology CT Abdomen Pelvis Wo Contrast  Result Date: 10/20/2021 CLINICAL DATA:  Colorectal  carcinoma. Elevated liver function. Metastatic disease evaluation. * Tracking Code: BO * EXAM: CT ABDOMEN AND PELVIS WITHOUT CONTRAST TECHNIQUE: Multidetector CT imaging of the abdomen and pelvis was performed following the standard protocol without IV contrast. RADIATION DOSE REDUCTION: This exam was performed according to the departmental dose-optimization program which includes automated exposure control, adjustment of the mA and/or kV according to patient size and/or use of iterative reconstruction technique. COMPARISON:  CT 07/05/2021, MRI 08/06/2021 FINDINGS: Lower chest: Moderate RIGHT pleural effusion and small LEFT pleural effusion. Hepatobiliary: No suspicious lesion within liver with limitations of noncontrast exam. Simple cyst within the central liver unchanged from comparison CT. Large periphery calcified gallstone within the lumen the gallbladder. No acute inflammation. Pancreas: Coarse calcifications through the distal pancreas leading up to the head of the pancreas. Stable duct dilatation in tail the pancreas. No change from comparison MRI or CT. Spleen: Normal spleen Adrenals/urinary tract: Adrenal glands and kidneys are normal. The ureters and bladder normal. Stomach/Bowel: Stomach, duodenum small-bowel normal. Post RIGHT hemicolectomy. LEFT colon normal. Vascular/Lymphatic: Calcification abdominal aorta. Fusiform dilatation of the for abdominal aorta to 3.1 cm. Reproductive: TURP defect within the prostate Other: Midline surgical wound with mild thickening at the skin surface measuring 13 mm. Musculoskeletal: No aggressive osseous lesion. IMPRESSION: 1. New bilateral small moderate pleural effusions, RIGHT greater than LEFT. 2. No evidence of hepatic metastasis on noncontrast CT. 3. Large gallstone without evidence acute cholecystitis. 4. Post RIGHT hemicolectomy without complicating features. 5. Thickening tissue at the skin surface along the ventral abdominal wall is likely scar tissue. 6. 3.1  cm abdominal aortic aneurysm. Recommend follow-up every 3 years. Reference: J Am Coll Radiol 8101;75:102-585. Electronically Signed   By: Suzy Bouchard M.D.   On: 10/20/2021 14:17   DG Chest Port 1 View  Result Date: 10/20/2021 CLINICAL DATA:  Shortness of breath EXAM: PORTABLE CHEST 1 VIEW COMPARISON:  08/13/2021 FINDINGS: Stable cardiomediastinal contours. Pulmonary vascular congestion with diffusely prominent interstitial markings bilaterally. No appreciable pleural fluid collection. No pneumothorax. IMPRESSION: Findings of CHF with interstitial  edema. Electronically Signed   By: Davina Poke D.O.   On: 10/20/2021 11:02    Procedures Procedures    Medications Ordered in ED Medications  albuterol (VENTOLIN HFA) 108 (90 Base) MCG/ACT inhaler 2 puff (2 puffs Inhalation Given 10/20/21 1047)  carvedilol (COREG) tablet 25 mg (has no administration in time range)  aspirin EC tablet 81 mg (has no administration in time range)  amLODipine (NORVASC) tablet 5 mg (has no administration in time range)  isosorbide mononitrate (IMDUR) 24 hr tablet 30 mg (has no administration in time range)  clopidogrel (PLAVIX) tablet 75 mg (has no administration in time range)  heparin injection 5,000 Units (has no administration in time range)  sodium chloride flush (NS) 0.9 % injection 3 mL (has no administration in time range)  sodium chloride flush (NS) 0.9 % injection 3 mL (has no administration in time range)  0.9 %  sodium chloride infusion (has no administration in time range)  acetaminophen (TYLENOL) tablet 650 mg (has no administration in time range)    Or  acetaminophen (TYLENOL) suppository 650 mg (has no administration in time range)  ondansetron (ZOFRAN) tablet 4 mg (has no administration in time range)    Or  ondansetron (ZOFRAN) injection 4 mg (has no administration in time range)  hydrALAZINE (APRESOLINE) injection 10 mg (has no administration in time range)  furosemide (LASIX) injection 80  mg (has no administration in time range)  Chlorhexidine Gluconate Cloth 2 % PADS 6 each (has no administration in time range)  furosemide (LASIX) injection 80 mg (80 mg Intravenous Given 10/20/21 1336)    ED Course/ Medical Decision Making/ A&P                           Medical Decision Making Amount and/or Complexity of Data Reviewed Labs: ordered. Radiology: ordered.  Risk Prescription drug management. Decision regarding hospitalization.  CRITICAL CARE Performed by: Fredia Sorrow Total critical care time: 45 minutes Critical care time was exclusive of separately billable procedures and treating other patients. Critical care was necessary to treat or prevent imminent or life-threatening deterioration. Critical care was time spent personally by me on the following activities: development of treatment plan with patient and/or surrogate as well as nursing, discussions with consultants, evaluation of patient's response to treatment, examination of patient, obtaining history from patient or surrogate, ordering and performing treatments and interventions, ordering and review of laboratory studies, ordering and review of radiographic studies, pulse oximetry and re-evaluation of patient's condition.  Patient chest x-ray consistent with congestive heart failure.  Patient had not taken his blood pressure medicine today.  Patient normally on Plavix and aspirin.  He is on Lasix as well we will give IV Lasix 80 mg for the CHF.  Patient's troponin came back markedly elevated at 450 patient denies any chest discomfort or pain at all even for the last several days.  Discussed with cardiology on-call.  Did not want to start heparin.  Agreed with the Lasix.  And will follow the troponins serially.  Patient has marked LFT abnormalities. Discussed with hospitalist for admission cardiology consultation to serial troponins.  Ordered CT abdomen for the marked LFT abnormalities.  The CT showed new bilateral  small moderate pleural effusions right greater than left no evidence of hepatic metastasis on noncontrast CT.  Patient's renal function would not tolerate IV contrast.  Large gallstone without evidence of cholecystitis in the past right hemicolectomy without complicating features.  And a 3.1  cm abdominal aortic aneurysm.  So nothing significant other than the large gallstone.  Patient BNP was markedly elevated at 4500 repeat troponin was 418 so is coming down slightly.  Hospitalist will admit for the CHF and the elevated troponins.  Final Clinical Impression(s) / ED Diagnoses Final diagnoses:  Acute congestive heart failure, unspecified heart failure type [facility])  NSTEMI (non-ST elevated myocardial infarction) [facility])    Rx / DC Orders ED Discharge Orders     None         Fredia Sorrow, MD 10/20/21 1540    Fredia Sorrow, MD 10/20/21 1540

## 2021-10-20 NOTE — ED Notes (Signed)
Albuterol inhaler self-administered by patient. No improvement in shortness of breath noted within 10 minutes.

## 2021-10-21 ENCOUNTER — Inpatient Hospital Stay (HOSPITAL_COMMUNITY): Payer: Medicare Other

## 2021-10-21 DIAGNOSIS — R0609 Other forms of dyspnea: Secondary | ICD-10-CM

## 2021-10-21 DIAGNOSIS — I6523 Occlusion and stenosis of bilateral carotid arteries: Secondary | ICD-10-CM

## 2021-10-21 LAB — ECHOCARDIOGRAM COMPLETE
AR max vel: 3.41 cm2
AV Area VTI: 2.65 cm2
AV Area mean vel: 2.91 cm2
AV Mean grad: 3 mmHg
AV Peak grad: 7.2 mmHg
Ao pk vel: 1.34 m/s
Area-P 1/2: 3.58 cm2
Calc EF: 27.3 %
Height: 72 in
MV VTI: 1.86 cm2
S' Lateral: 5.6 cm
Single Plane A2C EF: 27.5 %
Single Plane A4C EF: 30.2 %
Weight: 2672 oz

## 2021-10-21 LAB — HEPATITIS PANEL, ACUTE
HCV Ab: NONREACTIVE
Hep A IgM: NONREACTIVE
Hep B C IgM: NONREACTIVE
Hepatitis B Surface Ag: NONREACTIVE

## 2021-10-21 LAB — COMPREHENSIVE METABOLIC PANEL
ALT: 245 U/L — ABNORMAL HIGH (ref 0–44)
AST: 127 U/L — ABNORMAL HIGH (ref 15–41)
Albumin: 2.8 g/dL — ABNORMAL LOW (ref 3.5–5.0)
Alkaline Phosphatase: 111 U/L (ref 38–126)
Anion gap: 7 (ref 5–15)
BUN: 28 mg/dL — ABNORMAL HIGH (ref 8–23)
CO2: 27 mmol/L (ref 22–32)
Calcium: 8.1 mg/dL — ABNORMAL LOW (ref 8.9–10.3)
Chloride: 109 mmol/L (ref 98–111)
Creatinine, Ser: 2.06 mg/dL — ABNORMAL HIGH (ref 0.61–1.24)
GFR, Estimated: 35 mL/min — ABNORMAL LOW (ref >60)
Glucose, Bld: 78 mg/dL (ref 70–99)
Potassium: 2.8 mmol/L — ABNORMAL LOW (ref 3.5–5.1)
Sodium: 143 mmol/L (ref 135–145)
Total Bilirubin: 1.9 mg/dL — ABNORMAL HIGH (ref 0.3–1.2)
Total Protein: 5.5 g/dL — ABNORMAL LOW (ref 6.5–8.1)

## 2021-10-21 LAB — CBC
HCT: 37.4 % — ABNORMAL LOW (ref 39.0–52.0)
Hemoglobin: 11.2 g/dL — ABNORMAL LOW (ref 13.0–17.0)
MCH: 25.4 pg — ABNORMAL LOW (ref 26.0–34.0)
MCHC: 29.9 g/dL — ABNORMAL LOW (ref 30.0–36.0)
MCV: 84.8 fL (ref 80.0–100.0)
Platelets: 216 10*3/uL (ref 150–400)
RBC: 4.41 MIL/uL (ref 4.22–5.81)
RDW: 22.4 % — ABNORMAL HIGH (ref 11.5–15.5)
WBC: 7.6 10*3/uL (ref 4.0–10.5)
nRBC: 0 % (ref 0.0–0.2)

## 2021-10-21 LAB — MAGNESIUM: Magnesium: 1.8 mg/dL (ref 1.7–2.4)

## 2021-10-21 LAB — SARS CORONAVIRUS 2 BY RT PCR: SARS Coronavirus 2 by RT PCR: NEGATIVE

## 2021-10-21 LAB — TROPONIN I (HIGH SENSITIVITY): Troponin I (High Sensitivity): 291 ng/L (ref <18)

## 2021-10-21 MED ORDER — POTASSIUM CHLORIDE CRYS ER 20 MEQ PO TBCR
40.0000 meq | EXTENDED_RELEASE_TABLET | Freq: Once | ORAL | Status: AC
  Administered 2021-10-21: 40 meq via ORAL
  Filled 2021-10-21: qty 2

## 2021-10-21 MED ORDER — SODIUM CHLORIDE 0.9 % IV SOLN: INTRAVENOUS | Status: DC

## 2021-10-21 MED ORDER — POTASSIUM CHLORIDE 10 MEQ/100ML IV SOLN
INTRAVENOUS | Status: AC
  Filled 2021-10-21: qty 100

## 2021-10-21 MED ORDER — POTASSIUM CHLORIDE 10 MEQ/100ML IV SOLN
10.0000 meq | INTRAVENOUS | Status: AC
  Administered 2021-10-21 (×4): 10 meq via INTRAVENOUS
  Filled 2021-10-21 (×3): qty 100

## 2021-10-21 NOTE — Progress Notes (Signed)
Rounding Note    Patient Name: Michael [name] Date of Encounter: 10/21/2021  [place] Cardiologist: Rozann Lesches, MD   Subjective   Patient says his breathing is better today    Says he prior to admit he had felt bad since last GI surgery done this past summer   Inpatient Medications    Scheduled Meds:  amLODipine  5 mg Oral Daily   aspirin EC  81 mg Oral Daily   carvedilol  25 mg Oral BID WC   Chlorhexidine Gluconate Cloth  6 each Topical Q0600   clopidogrel  75 mg Oral Daily   furosemide  80 mg Intravenous Q12H   heparin  5,000 Units Subcutaneous Q8H   isosorbide mononitrate  30 mg Oral QPM   sodium chloride flush  3 mL Intravenous Q12H   Continuous Infusions:  sodium chloride     potassium chloride 10 mEq (10/21/21 1059)   PRN Meds: sodium chloride, acetaminophen **OR** acetaminophen, albuterol, hydrALAZINE, ondansetron **OR** ondansetron (ZOFRAN) IV, sodium chloride flush   Vital Signs    Vitals:   10/21/21 0700 10/21/21 0754 10/21/21 0800 10/21/21 0900  BP:  (!) 136/54 130/77 (!) 105/45  Pulse:  65 62 (!) 55  Resp:   (!) 22 20  Temp: 98.2 F (36.8 C)     TempSrc: Oral     SpO2:   91% 91%  Weight:      Height:        Intake/Output Summary (Last 24 hours) at 10/21/2021 1145 Last data filed at 10/21/2021 0902 Gross per 24 hour  Intake --  Output 2550 ml  Net -2550 ml      10/20/2021   10:05 AM 09/15/2021   10:50 AM 08/26/2021    9:50 AM  Last 3 Weights  Weight (lbs) 167 lb 161 lb 154 lb 3.5 oz  Weight (kg) 75.751 kg 73.029 kg 69.954 kg      Telemetry    Sinus bradycardia, HR in 50's to 60's.  - Personally Reviewed  ECG    No new tracings.   Physical Exam   GEN: Thin 66 yo in no acute distress.   Neck: No JVD Cardiac: RRR, no murmurs Respiratory: Clear to auscultation bilaterally. GI: Soft, nontender, non-distended  MS: No edema; No deformity. Neuro:  Nonfocal  Psych: Normal affect   Labs    High Sensitivity Troponin:    Recent Labs  Lab 10/20/21 1132 10/20/21 1335 10/21/21 0626  TROPONINIHS 450* 418* 291*     Chemistry Recent Labs  Lab 10/20/21 1023 10/21/21 0626  NA 142 143  K 3.2* 2.8*  CL 111 109  CO2 22 27  GLUCOSE 130* 78  BUN 31* 28*  CREATININE 1.98* 2.06*  CALCIUM 8.6* 8.1*  MG  --  1.8  PROT 6.8 5.5*  ALBUMIN 3.5 2.8*  AST 237* 127*  ALT 278* 245*  ALKPHOS 150* 111  BILITOT 2.1* 1.9*  GFRNONAA 36* 35*  ANIONGAP 9 7    Lipids No results for input(s): "CHOL", "TRIG", "HDL", "LABVLDL", "LDLCALC", "CHOLHDL" in the last 168 hours.  Hematology Recent Labs  Lab 10/20/21 1023 10/21/21 0626  WBC 8.1 7.6  RBC 5.27 4.41  HGB 13.1 11.2*  HCT 44.4 37.4*  MCV 84.3 84.8  MCH 24.9* 25.4*  MCHC 29.5* 29.9*  RDW 23.0* 22.4*  PLT 294 216   Thyroid No results for input(s): "TSH", "FREET4" in the last 168 hours.  BNP Recent Labs  Lab 10/20/21 1132  BNP >4,500.0*  DDimer No results for input(s): "DDIMER" in the last 168 hours.   Radiology    CT Abdomen Pelvis Wo Contrast  Result Date: 10/20/2021 CLINICAL DATA:  Colorectal carcinoma. Elevated liver function. Metastatic disease evaluation. * Tracking Code: BO * EXAM: CT ABDOMEN AND PELVIS WITHOUT CONTRAST TECHNIQUE: Multidetector CT imaging of the abdomen and pelvis was performed following the standard protocol without IV contrast. RADIATION DOSE REDUCTION: This exam was performed according to the departmental dose-optimization program which includes automated exposure control, adjustment of the mA and/or kV according to patient size and/or use of iterative reconstruction technique. COMPARISON:  CT 07/05/2021, MRI 08/06/2021 FINDINGS: Lower chest: Moderate RIGHT pleural effusion and small LEFT pleural effusion. Hepatobiliary: No suspicious lesion within liver with limitations of noncontrast exam. Simple cyst within the central liver unchanged from comparison CT. Large periphery calcified gallstone within the lumen the gallbladder. No  acute inflammation. Pancreas: Coarse calcifications through the distal pancreas leading up to the head of the pancreas. Stable duct dilatation in tail the pancreas. No change from comparison MRI or CT. Spleen: Normal spleen Adrenals/urinary tract: Adrenal glands and kidneys are normal. The ureters and bladder normal. Stomach/Bowel: Stomach, duodenum small-bowel normal. Post RIGHT hemicolectomy. LEFT colon normal. Vascular/Lymphatic: Calcification abdominal aorta. Fusiform dilatation of the for abdominal aorta to 3.1 cm. Reproductive: TURP defect within the prostate Other: Midline surgical wound with mild thickening at the skin surface measuring 13 mm. Musculoskeletal: No aggressive osseous lesion. IMPRESSION: 1. New bilateral small moderate pleural effusions, RIGHT greater than LEFT. 2. No evidence of hepatic metastasis on noncontrast CT. 3. Large gallstone without evidence acute cholecystitis. 4. Post RIGHT hemicolectomy without complicating features. 5. Thickening tissue at the skin surface along the ventral abdominal wall is likely scar tissue. 6. 3.1 cm abdominal aortic aneurysm. Recommend follow-up every 3 years. Reference: J Am Coll Radiol 0160;10:932-355. Electronically Signed   By: Suzy Bouchard M.D.   On: 10/20/2021 14:17   DG Chest Port 1 View  Result Date: 10/20/2021 CLINICAL DATA:  Shortness of breath EXAM: PORTABLE CHEST 1 VIEW COMPARISON:  08/13/2021 FINDINGS: Stable cardiomediastinal contours. Pulmonary vascular congestion with diffusely prominent interstitial markings bilaterally. No appreciable pleural fluid collection. No pneumothorax. IMPRESSION: Findings of CHF with interstitial edema. Electronically Signed   By: Davina Poke D.O.   On: 10/20/2021 11:02    Cardiac Studies   NST: 10/2017 Findings consistent with prior inferior/septal myocardial infarction with mild peri-infarct ischemia. This is a high risk study. Risk primarily based on decreased LVEF. There is a prior infarct  with only mild current ischemia. Consider correlating LVEF with echocardiogram. The left ventricular ejection fraction is moderately decreased (33%).  Limited Echo: 12/2017 Study Conclusions   - Left ventricle: The cavity size was normal. Wall thickness was    increased in a pattern of mild LVH. Systolic function was mildly    reduced. The estimated ejection fraction was in the range of 45%    to 50%. There is hypokinesis of the basal-midinferolateral    myocardium. Features are consistent with a pseudonormal left    ventricular filling pattern, with concomitant abnormal relaxation    and increased filling pressure (grade 2 diastolic dysfunction).  - Aortic valve: Mildly calcified annulus. Trileaflet; mildly    calcified leaflets.  - Mitral valve: Mildly calcified annulus. There was mild    regurgitation.  - Left atrium: The atrium was mildly dilated.  - Tricuspid valve: There was trivial regurgitation.  - Pulmonary arteries: Systolic pressure could not be accurately  estimated.  - Pericardium, extracardiac: A trivial pericardial effusion was    identified anterior to the heart.   Patient Profile     66 y.o. male w/PMH of HFmrEF (EF 45-50% by echo in 12/2017 with NST showing scar and mild ischemia), carotid artery stenosis (s/p right TCAR in 01/2021), iron deficiency anemia, HTN, HLD and Stage 3 CKD who is currently admitted for hypertensive urgency and an acute CHF exacerbation. Had recent removal of a colonic mass in 07/2021.   Assessment & Plan    1. Elevated Troponin Values - Initial Hs Troponin was elevated to 450 with repeat values trending down to 418 and 291.    Echo today shows LVEF more depressed from echo in 2019   There is inferior and lateral hypokinesis (as he had back then) He denies CP    Never had a cath.   Only myoview in 2019     2. Acute HFmrEF Pt has diuresed signficantly yesterday    Cr slightly higher than yesteday   2.0    I would hold further diuretic for  now .    Review of his echo his LVEF is down from previous   He had a myoview in 2019 but never a cath     Will review labs in am  Consider cath to finally define  coroanry anatomy     3. Hypertensive Urgency - BP is signifciantly improved from yesterday    Follow   4. Carotid Artery Stenosis - Underwent TCAR in 01/2021 and followed by Vascular as an outpatient.   5. Stage 3 CKD - Baseline creatinine 1.6 - 1.8. At 1.98 on admission and at 2.06 today.  Hold further diuretics    Provide gentle hydration after MN    6. Hypokalemia - K+ at 2.8 this AM. Supplementation ordered this AM by the admitting team. Would recheck this afternoon.   7. Elevated LFT's - Improving as AST was initially 237 with ALT 278. At 127 and 245 this AM. Continue to follow.    8  HL   On lipitor at home   On hold with increased LFTs     For questions or updates, please contact [place] Please consult [email or web address] for contact info under

## 2021-10-21 NOTE — TOC Progression Note (Signed)
Transition of Care [facility]) - Progression Note    Patient Details  Name: Kymoni Monday MRN: [id number] Date of Birth: 1955/04/26  Transition of Care [facility]) CM/SW Contact  Salome Arnt, [place] Phone Number: 10/21/2021, 9:59 AM  Clinical Narrative:   Transition of Care [facility]) Screening Note   Patient Details  Name: Feras Gardella Date of Birth: 1955/04/16   Transition of Care [facility]) CM/SW Contact:    Salome Arnt, [place] Phone Number: 10/21/2021, 9:59 AM    Transition of Care Department [facility]) has reviewed patient and no TOC needs have been identified at this time. We will continue to monitor patient advancement through interdisciplinary progression rounds. If new patient transition needs arise, please place a TOC consult.        Barriers to Discharge: Continued Medical Work up  Expected Discharge Plan and Services                                                 Social Determinants of Health (SDOH) Interventions    Readmission Risk Interventions    02/18/2021   11:33 AM  Readmission Risk Prevention Plan  Transportation Screening Complete  PCP or Specialist Appt within 5-7 Days Complete  Home Care Screening Complete  Medication Review (RN CM) Complete

## 2021-10-21 NOTE — Progress Notes (Signed)
*  PRELIMINARY RESULTS* Echocardiogram 2D Echocardiogram has been performed.  Michael [name] 10/21/2021, 9:50 AM

## 2021-10-21 NOTE — Progress Notes (Signed)
PROGRESS NOTE    Michael [name]  FIE:[id number] DOB: [birth year] DOA: 10/20/2021 PCP: [name], MD   Brief Narrative:    Michael [name] is a 66 y.o. male with medical history significant for carotid artery disease, COPD, CAD, CKD stage IIIb, hypertension, prior head and neck cancer, and chronic diastolic/systolic CHF who presented with complaints of dyspnea which has been intermittent over the past 2 weeks since his surgery.  Patient was admitted with hypertensive urgency and acute on chronic systolic/diastolic heart failure exacerbation with noted elevated troponin.  Assessment & Plan:   Active Problems:   Essential hypertension   Elevated troponin   Iron (Fe) deficiency anemia   Carotid artery disease (HCC)   CAD (coronary artery disease)   Stage 3a chronic kidney disease (HCC)   Colonic mass   Type 2 MI (myocardial infarction) (HCC)  Assessment and Plan:  Acute on chronic systolic/diastolic heart failure exacerbation Likely related to hypertensive urgency Continue IV Lasix 80 mg twice daily Repeat 2D echocardiogram results pending Carvedilol Over 2 L of urine output noted in the last 24 hours   Hypertensive urgency-resolved Continue Coreg Continue amlodipine and Imdur Hydralazine as needed Okay for transfer to telemetry   Elevated troponin  Possibly related to above, no chest pain noted Noted follow repeat troponin 2D echocardiogram Appreciate cardiology recommendations Carvedilol 25 mg twice daily   Transaminitis-improving Likely due to hepatic congestion in the setting of CHF-continue to trend CT abdomen with large gallstone but no findings of cholecystitis or biliary obstruction Hepatitis panel negative Continue to monitor closely   CKD stage IIIb Creatinine currently at baseline, monitor with diuresis   History of COPD Note acute bronchospasms currently noted, DuoNebs as needed   Recent colonic mass removal 7/23 High-grade dysplasia noted on pathology  report with no invasive carcinoma   Hypokalemia Replete and monitor with diuresis   Carotid artery disease Prior right carotid severe stenosis with TCAR   Dyslipidemia Currently on Repatha with LDL 64   Prior head neck cancer with resection    DVT prophylaxis: Heparin Code Status: Full Family Communication: None at bedside Disposition Plan:  Status is: Inpatient Remains inpatient appropriate because: Need for IV medications   Skin Assessment:  I have examined the patient's skin and I agree with the wound assessment as performed by the wound care RN as outlined below:  Pressure Injury 10/20/21 Perineum Mid Stage 1 -  Intact skin with non-blanchable redness of a localized area usually over a bony prominence. Deeply reddened.  Non-blanchable and raw throughout perineum (Active)  10/20/21 1612  Location: Perineum  Location Orientation: Mid  Staging: Stage 1 -  Intact skin with non-blanchable redness of a localized area usually over a bony prominence.  Wound Description (Comments): Deeply reddened.  Non-blanchable and raw throughout perineum  Present on Admission: Yes    Consultants:  Cardiology  Procedures:  None  Antimicrobials:  None   Subjective: Patient seen and evaluated today with no new acute complaints or concerns. No acute concerns or events noted overnight.  Shortness of breath has improved and he denies any chest pain.  Objective: Vitals:   10/21/21 0700 10/21/21 0754 10/21/21 0800 10/21/21 0900  BP:  (!) 136/54 130/77 (!) 105/45  Pulse:  65 62 (!) 55  Resp:   (!) 22 20  Temp: 98.2 F (36.8 C)     TempSrc: Oral     SpO2:   91% 91%  Weight:      Height:  Intake/Output Summary (Last 24 hours) at 10/21/2021 1157 Last data filed at 10/21/2021 0902 Gross per 24 hour  Intake --  Output 2550 ml  Net -2550 ml   Filed Weights   10/20/21 1005  Weight: 75.8 kg    Examination:  General exam: Appears calm and comfortable  Respiratory system:  Clear to auscultation. Respiratory effort normal. Cardiovascular system: S1 & S2 heard, RRR.  Gastrointestinal system: Abdomen is soft Central nervous system: Alert and awake Extremities: No edema Skin: No significant lesions noted Psychiatry: Flat affect.    Data Reviewed: I have personally reviewed following labs and imaging studies  CBC: Recent Labs  Lab 10/20/21 1023 10/21/21 0626  WBC 8.1 7.6  NEUTROABS 6.7  --   HGB 13.1 11.2*  HCT 44.4 37.4*  MCV 84.3 84.8  PLT 294 932   Basic Metabolic Panel: Recent Labs  Lab 10/20/21 1023 10/21/21 0626  NA 142 143  K 3.2* 2.8*  CL 111 109  CO2 22 27  GLUCOSE 130* 78  BUN 31* 28*  CREATININE 1.98* 2.06*  CALCIUM 8.6* 8.1*  MG  --  1.8   GFR: Estimated Creatinine Clearance: 37.3 mL/min (A) (by C-G formula based on SCr of 2.06 mg/dL (H)). Liver Function Tests: Recent Labs  Lab 10/20/21 1023 10/21/21 0626  AST 237* 127*  ALT 278* 245*  ALKPHOS 150* 111  BILITOT 2.1* 1.9*  PROT 6.8 5.5*  ALBUMIN 3.5 2.8*   No results for input(s): "LIPASE", "AMYLASE" in the last 168 hours. No results for input(s): "AMMONIA" in the last 168 hours. Coagulation Profile: No results for input(s): "INR", "PROTIME" in the last 168 hours. Cardiac Enzymes: No results for input(s): "CKTOTAL", "CKMB", "CKMBINDEX", "TROPONINI" in the last 168 hours. BNP (last 3 results) No results for input(s): "PROBNP" in the last 8760 hours. HbA1C: No results for input(s): "HGBA1C" in the last 72 hours. CBG: No results for input(s): "GLUCAP" in the last 168 hours. Lipid Profile: No results for input(s): "CHOL", "HDL", "LDLCALC", "TRIG", "CHOLHDL", "LDLDIRECT" in the last 72 hours. Thyroid Function Tests: No results for input(s): "TSH", "T4TOTAL", "FREET4", "T3FREE", "THYROIDAB" in the last 72 hours. Anemia Panel: No results for input(s): "VITAMINB12", "FOLATE", "FERRITIN", "TIBC", "IRON", "RETICCTPCT" in the last 72 hours. Sepsis Labs: No results for  input(s): "PROCALCITON", "LATICACIDVEN" in the last 168 hours.  Recent Results (from the past 240 hour(s))  MRSA Next Gen by PCR, Nasal     Status: None   Collection Time: 10/20/21  4:35 PM   Specimen: Nasal Mucosa; Nasal Swab  Result Value Ref Range Status   MRSA by PCR Next Gen NOT DETECTED NOT DETECTED Final    Comment: (NOTE) The GeneXpert MRSA Assay (FDA approved for NASAL specimens only), is one component of a comprehensive MRSA colonization surveillance program. It is not intended to diagnose MRSA infection nor to guide or monitor treatment for MRSA infections. Test performance is not FDA approved in patients less than 49 years old. Performed at [facility], [address], [place], [place] [address]   SARS Coronavirus 2 by RT PCR (hospital order, performed in [facility] hospital lab) *cepheid single result test* Anterior Nasal Swab     Status: None   Collection Time: 10/21/21  9:40 AM   Specimen: Anterior Nasal Swab  Result Value Ref Range Status   SARS Coronavirus 2 by RT PCR NEGATIVE NEGATIVE Final    Comment: (NOTE) SARS-CoV-2 target nucleic acids are NOT DETECTED.  The SARS-CoV-2 RNA is generally detectable in upper  and lower respiratory specimens during the acute phase of infection. The lowest concentration of SARS-CoV-2 viral copies this assay can detect is 250 copies / mL. A negative result does not preclude SARS-CoV-2 infection and should not be used as the sole basis for treatment or other patient management decisions.  A negative result may occur with improper specimen collection / handling, submission of specimen other than nasopharyngeal swab, presence of viral mutation(s) within the areas targeted by this assay, and inadequate number of viral copies (<250 copies / mL). A negative result must be combined with clinical observations, patient history, and epidemiological information.  Fact Sheet for Patients:   https://www.patel.info/  Fact  Sheet for Healthcare Providers: https://hall.com/  This test is not yet approved or  cleared by the [place] FDA and has been authorized for detection and/or diagnosis of SARS-CoV-2 by FDA under an Emergency Use Authorization (EUA).  This EUA will remain in effect (meaning this test can be used) for the duration of the COVID-19 declaration under Section 564(b)(1) of the Act, 21 U.S.C. section 360bbb-3(b)(1), unless the authorization is terminated or revoked sooner.  Performed at [facility], [address], [place], [place] [address]          Radiology Studies: CT Abdomen Pelvis Wo Contrast  Result Date: 10/20/2021 CLINICAL DATA:  Colorectal carcinoma. Elevated liver function. Metastatic disease evaluation. * Tracking Code: BO * EXAM: CT ABDOMEN AND PELVIS WITHOUT CONTRAST TECHNIQUE: Multidetector CT imaging of the abdomen and pelvis was performed following the standard protocol without IV contrast. RADIATION DOSE REDUCTION: This exam was performed according to the departmental dose-optimization program which includes automated exposure control, adjustment of the mA and/or kV according to patient size and/or use of iterative reconstruction technique. COMPARISON:  CT 07/05/2021, MRI 08/06/2021 FINDINGS: Lower chest: Moderate RIGHT pleural effusion and small LEFT pleural effusion. Hepatobiliary: No suspicious lesion within liver with limitations of noncontrast exam. Simple cyst within the central liver unchanged from comparison CT. Large periphery calcified gallstone within the lumen the gallbladder. No acute inflammation. Pancreas: Coarse calcifications through the distal pancreas leading up to the head of the pancreas. Stable duct dilatation in tail the pancreas. No change from comparison MRI or CT. Spleen: Normal spleen Adrenals/urinary tract: Adrenal glands and kidneys are normal. The ureters and bladder normal. Stomach/Bowel: Stomach, duodenum small-bowel normal.  Post RIGHT hemicolectomy. LEFT colon normal. Vascular/Lymphatic: Calcification abdominal aorta. Fusiform dilatation of the for abdominal aorta to 3.1 cm. Reproductive: TURP defect within the prostate Other: Midline surgical wound with mild thickening at the skin surface measuring 13 mm. Musculoskeletal: No aggressive osseous lesion. IMPRESSION: 1. New bilateral small moderate pleural effusions, RIGHT greater than LEFT. 2. No evidence of hepatic metastasis on noncontrast CT. 3. Large gallstone without evidence acute cholecystitis. 4. Post RIGHT hemicolectomy without complicating features. 5. Thickening tissue at the skin surface along the ventral abdominal wall is likely scar tissue. 6. 3.1 cm abdominal aortic aneurysm. Recommend follow-up every 3 years. Reference: J Am Coll Radiol 0102;72:536-644. Electronically Signed   By: Suzy Bouchard M.D.   On: 10/20/2021 14:17   DG Chest Port 1 View  Result Date: 10/20/2021 CLINICAL DATA:  Shortness of breath EXAM: PORTABLE CHEST 1 VIEW COMPARISON:  08/13/2021 FINDINGS: Stable cardiomediastinal contours. Pulmonary vascular congestion with diffusely prominent interstitial markings bilaterally. No appreciable pleural fluid collection. No pneumothorax. IMPRESSION: Findings of CHF with interstitial edema. Electronically Signed   By: Davina Poke D.O.   On: 10/20/2021 11:02  Scheduled Meds:  amLODipine  5 mg Oral Daily   aspirin EC  81 mg Oral Daily   carvedilol  25 mg Oral BID WC   Chlorhexidine Gluconate Cloth  6 each Topical Q0600   clopidogrel  75 mg Oral Daily   furosemide  80 mg Intravenous Q12H   heparin  5,000 Units Subcutaneous Q8H   isosorbide mononitrate  30 mg Oral QPM   sodium chloride flush  3 mL Intravenous Q12H   Continuous Infusions:  sodium chloride     potassium chloride 10 mEq (10/21/21 1059)     LOS: 1 day    Time spent: 35 minutes    Tonnya Garbett Darleen Crocker, DO [facility]  If 7PM-7AM, please contact  night-coverage [email or web address] 10/21/2021, 11:57 AM

## 2021-10-22 ENCOUNTER — Encounter (HOSPITAL_COMMUNITY)
Admission: EM | Disposition: A | Discharge status: Home Health | Payer: Self-pay | Source: Home / Self Care | Attending: Internal Medicine

## 2021-10-22 ENCOUNTER — Encounter (HOSPITAL_COMMUNITY): Payer: Self-pay | Admitting: Internal Medicine

## 2021-10-22 DIAGNOSIS — E876 Hypokalemia: Secondary | ICD-10-CM

## 2021-10-22 DIAGNOSIS — I5021 Acute systolic (congestive) heart failure: Secondary | ICD-10-CM

## 2021-10-22 DIAGNOSIS — R7989 Other specified abnormal findings of blood chemistry: Secondary | ICD-10-CM | POA: Diagnosis not present

## 2021-10-22 DIAGNOSIS — I160 Hypertensive urgency: Secondary | ICD-10-CM

## 2021-10-22 DIAGNOSIS — E785 Hyperlipidemia, unspecified: Secondary | ICD-10-CM

## 2021-10-22 DIAGNOSIS — I2510 Atherosclerotic heart disease of native coronary artery without angina pectoris: Secondary | ICD-10-CM

## 2021-10-22 DIAGNOSIS — L8990 Pressure ulcer of unspecified site, unspecified stage: Secondary | ICD-10-CM | POA: Insufficient documentation

## 2021-10-22 DIAGNOSIS — N183 Chronic kidney disease, stage 3 unspecified: Secondary | ICD-10-CM

## 2021-10-22 DIAGNOSIS — I428 Other cardiomyopathies: Secondary | ICD-10-CM | POA: Diagnosis not present

## 2021-10-22 HISTORY — PX: RIGHT/LEFT HEART CATH AND CORONARY ANGIOGRAPHY: CATH118266

## 2021-10-22 LAB — CBC
HCT: 37.2 % — ABNORMAL LOW (ref 39.0–52.0)
Hemoglobin: 11.2 g/dL — ABNORMAL LOW (ref 13.0–17.0)
MCH: 25.3 pg — ABNORMAL LOW (ref 26.0–34.0)
MCHC: 30.1 g/dL (ref 30.0–36.0)
MCV: 84.2 fL (ref 80.0–100.0)
Platelets: 204 10*3/uL (ref 150–400)
RBC: 4.42 MIL/uL (ref 4.22–5.81)
RDW: 22.2 % — ABNORMAL HIGH (ref 11.5–15.5)
WBC: 6.8 10*3/uL (ref 4.0–10.5)
nRBC: 0 % (ref 0.0–0.2)

## 2021-10-22 LAB — GLUCOSE, CAPILLARY: Glucose-Capillary: 106 mg/dL — ABNORMAL HIGH (ref 70–99)

## 2021-10-22 LAB — BASIC METABOLIC PANEL
Anion gap: 6 (ref 5–15)
BUN: 30 mg/dL — ABNORMAL HIGH (ref 8–23)
CO2: 29 mmol/L (ref 22–32)
Calcium: 8.2 mg/dL — ABNORMAL LOW (ref 8.9–10.3)
Chloride: 107 mmol/L (ref 98–111)
Creatinine, Ser: 1.9 mg/dL — ABNORMAL HIGH (ref 0.61–1.24)
GFR, Estimated: 38 mL/min — ABNORMAL LOW (ref >60)
Glucose, Bld: 109 mg/dL — ABNORMAL HIGH (ref 70–99)
Potassium: 3.2 mmol/L — ABNORMAL LOW (ref 3.5–5.1)
Sodium: 142 mmol/L (ref 135–145)

## 2021-10-22 LAB — MAGNESIUM: Magnesium: 1.7 mg/dL (ref 1.7–2.4)

## 2021-10-22 SURGERY — RIGHT/LEFT HEART CATH AND CORONARY ANGIOGRAPHY
Anesthesia: LOCAL

## 2021-10-22 MED ORDER — MIDAZOLAM HCL 2 MG/2ML IJ SOLN
INTRAMUSCULAR | Status: AC
  Filled 2021-10-22: qty 2

## 2021-10-22 MED ORDER — HEPARIN SODIUM (PORCINE) 1000 UNIT/ML IJ SOLN
INTRAMUSCULAR | Status: DC | PRN
  Administered 2021-10-22: 5000 [IU] via INTRA_ARTERIAL

## 2021-10-22 MED ORDER — HEPARIN SODIUM (PORCINE) 1000 UNIT/ML IJ SOLN
INTRAMUSCULAR | Status: AC
  Filled 2021-10-22: qty 10

## 2021-10-22 MED ORDER — HEPARIN (PORCINE) IN NACL 1000-0.9 UT/500ML-% IV SOLN
INTRAVENOUS | Status: AC
  Filled 2021-10-22: qty 1000

## 2021-10-22 MED ORDER — ONDANSETRON HCL 4 MG/2ML IJ SOLN: 4.0000 mg | Freq: Four times a day (QID) | INTRAMUSCULAR | Status: DC | PRN

## 2021-10-22 MED ORDER — SODIUM CHLORIDE 0.9 % IV SOLN: INTRAVENOUS | Status: DC

## 2021-10-22 MED ORDER — POTASSIUM CHLORIDE CRYS ER 20 MEQ PO TBCR
60.0000 meq | EXTENDED_RELEASE_TABLET | Freq: Once | ORAL | Status: AC
  Administered 2021-10-22: 60 meq via ORAL
  Filled 2021-10-22: qty 3

## 2021-10-22 MED ORDER — VERAPAMIL HCL 2.5 MG/ML IV SOLN
INTRAVENOUS | Status: AC
  Filled 2021-10-22: qty 2

## 2021-10-22 MED ORDER — HYDRALAZINE HCL 20 MG/ML IJ SOLN: 10.0000 mg | INTRAMUSCULAR | Status: AC | PRN

## 2021-10-22 MED ORDER — ACETAMINOPHEN 325 MG PO TABS
650.0000 mg | ORAL_TABLET | ORAL | Status: DC | PRN
  Administered 2021-10-26: 650 mg via ORAL
  Filled 2021-10-22: qty 2

## 2021-10-22 MED ORDER — MIDAZOLAM HCL 2 MG/2ML IJ SOLN
INTRAMUSCULAR | Status: DC | PRN
  Administered 2021-10-22: 1 mg via INTRAVENOUS

## 2021-10-22 MED ORDER — FUROSEMIDE 10 MG/ML IJ SOLN
80.0000 mg | Freq: Two times a day (BID) | INTRAMUSCULAR | Status: DC
  Administered 2021-10-22 – 2021-10-24 (×4): 80 mg via INTRAVENOUS
  Filled 2021-10-22 (×4): qty 8

## 2021-10-22 MED ORDER — LIDOCAINE HCL (PF) 1 % IJ SOLN
INTRAMUSCULAR | Status: DC | PRN
  Administered 2021-10-22 (×2): 2 mL

## 2021-10-22 MED ORDER — IOHEXOL 350 MG/ML SOLN
INTRAVENOUS | Status: DC | PRN
  Administered 2021-10-22: 39 mL

## 2021-10-22 MED ORDER — SODIUM CHLORIDE 0.9 % IV SOLN: 250.0000 mL | INTRAVENOUS | Status: DC | PRN

## 2021-10-22 MED ORDER — POTASSIUM CHLORIDE CRYS ER 20 MEQ PO TBCR: 40.0000 meq | EXTENDED_RELEASE_TABLET | Freq: Two times a day (BID) | ORAL | Status: DC

## 2021-10-22 MED ORDER — FENTANYL CITRATE (PF) 100 MCG/2ML IJ SOLN
INTRAMUSCULAR | Status: AC
  Filled 2021-10-22: qty 2

## 2021-10-22 MED ORDER — VERAPAMIL HCL 2.5 MG/ML IV SOLN
INTRAVENOUS | Status: DC | PRN
  Administered 2021-10-22: 10 mL via INTRA_ARTERIAL

## 2021-10-22 MED ORDER — SODIUM CHLORIDE 0.9% FLUSH
3.0000 mL | Freq: Two times a day (BID) | INTRAVENOUS | Status: DC
  Administered 2021-10-22 – 2021-10-23 (×3): 3 mL via INTRAVENOUS

## 2021-10-22 MED ORDER — LIDOCAINE HCL (PF) 1 % IJ SOLN
INTRAMUSCULAR | Status: AC
  Filled 2021-10-22: qty 30

## 2021-10-22 MED ORDER — SODIUM CHLORIDE 0.9% FLUSH: 3.0000 mL | INTRAVENOUS | Status: DC | PRN

## 2021-10-22 MED ORDER — HEPARIN (PORCINE) IN NACL 1000-0.9 UT/500ML-% IV SOLN
INTRAVENOUS | Status: DC | PRN
  Administered 2021-10-22 (×2): 500 mL

## 2021-10-22 MED ORDER — LABETALOL HCL 5 MG/ML IV SOLN: 10.0000 mg | INTRAVENOUS | Status: AC | PRN

## 2021-10-22 MED ORDER — ASPIRIN 81 MG PO CHEW: 81.0000 mg | CHEWABLE_TABLET | ORAL | Status: DC

## 2021-10-22 MED ORDER — FENTANYL CITRATE (PF) 100 MCG/2ML IJ SOLN
INTRAMUSCULAR | Status: DC | PRN
  Administered 2021-10-22: 25 ug via INTRAVENOUS

## 2021-10-22 MED ORDER — SODIUM CHLORIDE 0.9% FLUSH
3.0000 mL | Freq: Two times a day (BID) | INTRAVENOUS | Status: DC
  Administered 2021-10-22 – 2021-10-26 (×7): 3 mL via INTRAVENOUS

## 2021-10-22 SURGICAL SUPPLY — 11 items
CATH 5FR PIGTAIL DIAGNOSTIC (CATHETERS) IMPLANT
CATH BALLN WEDGE 5F 110CM (CATHETERS) IMPLANT
CATH OPTITORQUE TIG 4.0 6F (CATHETERS) IMPLANT
DEVICE RAD COMP TR BAND LRG (VASCULAR PRODUCTS) IMPLANT
GLIDESHEATH SLEND SS 6F .021 (SHEATH) IMPLANT
GUIDEWIRE .025 260CM (WIRE) IMPLANT
KIT RIGHT HEART ACIST (MISCELLANEOUS) IMPLANT
PACK CARDIAC CATHETERIZATION (CUSTOM PROCEDURE TRAY) ×1 IMPLANT
SHEATH GLIDE SLENDER 4/5FR (SHEATH) IMPLANT
TUBING CIL FLEX 10 FLL-RA (TUBING) ×1 IMPLANT
WIRE EMERALD 3MM-J .035X260CM (WIRE) IMPLANT

## 2021-10-22 NOTE — Progress Notes (Signed)
   Patient transferred to [facility] for [facility] from [facility] for work-up of worsening cardiomyopathy and CHF. Case discussed with Dr. Ali Lowe, who felt that the mild to moderate non-obstructive disease was not proportional to the degree of LV dysfunction and would be difficult to address interventionally. RHC shows volume overload - he has been hydrated prior to the procedure. Would d/c further IV fluids given his low EF and resume diuresis today. Plan optimize medical therapy for heart failure and non-obstructive CAD.  Pixie Casino, MD, [facility], [place] Director of the Advanced Lipid Disorders &  Cardiovascular Risk Reduction Clinic Diplomate of the American Board of Clinical Lipidology Attending Cardiologist  Direct Dial: [phone]  Fax: [phone]  Website:  [email or web address]

## 2021-10-22 NOTE — Progress Notes (Signed)
Received from [facility] via [place].  Skin warm and dry resp even and unlabored, no complaint of chest pain or discomfort at this time.  IVF infusing in left arm, patient placed on monitor, and consent signed .

## 2021-10-22 NOTE — Progress Notes (Signed)
Patient transported to [facility] via [address]. Packet given to [employer] and belongings sent with patient.

## 2021-10-22 NOTE — Interval H&P Note (Signed)
History and Physical Interval Note:  10/22/2021 11:29 AM  Michael [name]  has presented today for surgery, with the diagnosis of chest pain.  The various methods of treatment have been discussed with the patient and family. After consideration of risks, benefits and other options for treatment, the patient has consented to  Procedure(s): RIGHT/LEFT HEART CATH AND CORONARY ANGIOGRAPHY (N/A) as a surgical intervention.  The patient's history has been reviewed, patient examined, no change in status, stable for surgery.  I have reviewed the patient's chart and labs.  Questions were answered to the patient's satisfaction.     Early Osmond

## 2021-10-22 NOTE — Progress Notes (Signed)
Pharmacist Heart Failure Core Measure Documentation  Assessment: Michael [name] has an EF documented as 30% on 9/21 by Echo.  Rationale: Heart failure patients with left ventricular systolic dysfunction (LVSD) and an EF < 40% should be prescribed an angiotensin converting enzyme inhibitor (ACEI) or angiotensin receptor blocker (ARB) at discharge unless a contraindication is documented in the medical record.  This patient is not currently on an ACEI or ARB for HF.  This note is being placed in the record in order to provide documentation that a contraindication to the use of these agents is present for this encounter.  ACE Inhibitor or Angiotensin Receptor Blocker is contraindicated (specify all that apply)  '[]'$   ACEI allergy AND ARB allergy '[]'$   Angioedema '[]'$   Moderate or severe aortic stenosis '[]'$   Hyperkalemia '[]'$   Hypotension '[]'$   Renal artery stenosis '[x]'$   Worsening renal function, preexisting renal disease or dysfunction  Hildred Laser, PharmD Clinical Pharmacist **Pharmacist phone directory can now be found on [email or web address] ([email or web address]).  Listed under [place].

## 2021-10-22 NOTE — Progress Notes (Signed)
2330 pt walking naked in hallway, had removed his IV and telemetry leads as well. Stated he thought he was home and was looking for a snack. Assisted pt back to bed and reoriented pt, explained he would need new IVs for his procedure tomorrow, pt gave verbal consent. However pt verbally and physically rescinded consent when this RN brought IV start supplies to bedside, had found a good vein and prepped the site. Pt stated that he would let us "try again in the morning." MD notified.

## 2021-10-22 NOTE — Progress Notes (Signed)
PROGRESS NOTE    Michael [name]  WUJ:[id number] DOB: 09-04-1955 DOA: 10/20/2021 PCP: [name], MD   Brief Narrative:    Michael [name] is a 66 y.o. male with medical history significant for carotid artery disease, COPD, CAD, CKD stage IIIb, hypertension, prior head and neck cancer, and chronic diastolic/systolic CHF who presented with complaints of dyspnea which has been intermittent over the past 2 weeks since his surgery.  Patient was admitted with hypertensive urgency and acute on chronic systolic/diastolic heart failure exacerbation with noted elevated troponin.  He will need to go to [facility] for cardiac catheterization due to depressed LVEF noted on 2D echocardiogram 9/21.  Assessment & Plan:   Active Problems:   Essential hypertension   Elevated troponin   Iron (Fe) deficiency anemia   Carotid artery disease (HCC)   CAD (coronary artery disease)   Stage 3a chronic kidney disease (HCC)   Colonic mass   Type 2 MI (myocardial infarction) ([place])  Assessment and Plan:   Acute on chronic systolic/diastolic heart failure exacerbation 2D echocardiogram 9/21 with EF down to 30% and cardiac catheterization recommended for definitive evaluation, transfer to [facility] per cardiology Likely related to hypertensive urgency Carvedilol Over 2 L of urine output noted in the last 24 hours Additional diuresis held for now and he has received some IV fluids in preparation for catheterization  Acute delirium Seems to have capacity to give consent for catheterization today We will need close monitoring as he was quite confused last night   Hypertensive urgency-resolved Continue Coreg Continue amlodipine and Imdur Hydralazine as needed   Elevated troponin  Possibly related to above, no chest pain noted Noted follow repeat troponin 2D echocardiogram Appreciate cardiology recommendations Carvedilol 25 mg twice daily   Transaminitis-improving Likely due to hepatic congestion in the  setting of CHF-continue to trend CT abdomen with large gallstone but no findings of cholecystitis or biliary obstruction Hepatitis panel negative Continue to monitor closely   CKD stage IIIb Creatinine currently at baseline, monitor with diuresis Receiving IV fluid in anticipation of catheterization   History of COPD Note acute bronchospasms currently noted, DuoNebs as needed   Recent colonic mass removal 7/23 High-grade dysplasia noted on pathology report with no invasive carcinoma   Hypokalemia Replete and monitor with diuresis   Carotid artery disease Prior right carotid severe stenosis with TCAR   Dyslipidemia Currently on Repatha with LDL 64   Prior head neck cancer with resection     DVT prophylaxis: Heparin Code Status: Full Family Communication: None at bedside Disposition Plan:  Status is: Inpatient Remains inpatient appropriate because: Need for IV medications     Skin Assessment:   I have examined the patient's skin and I agree with the wound assessment as performed by the wound care RN as outlined below:   Pressure Injury 10/20/21 Perineum Mid Stage 1 -  Intact skin with non-blanchable redness of a localized area usually over a bony prominence. Deeply reddened.  Non-blanchable and raw throughout perineum (Active)  10/20/21 1612  Location: Perineum  Location Orientation: Mid  Staging: Stage 1 -  Intact skin with non-blanchable redness of a localized area usually over a bony prominence.  Wound Description (Comments): Deeply reddened.  Non-blanchable and raw throughout perineum  Present on Admission: Yes      Consultants:  Cardiology   Procedures:  None   Antimicrobials:  None     Subjective: Patient seen and evaluated today with no new acute complaints or concerns.  He  was noted to have some confusion last night and was walking in the hallways and had removed IVs.  He is aware of the plan to transfer to [facility] for catheterization and is  agreeable.  Objective: Vitals:   10/22/21 0011 10/22/21 0354 10/22/21 0600 10/22/21 0629  BP:  137/76  136/88  Pulse:  61  63  Resp:  18  18  Temp:  98 F (36.7 C)  98.6 F (37 C)  TempSrc:  Oral    SpO2:  97%  100%  Weight: 69 kg  68.3 kg   Height:        Intake/Output Summary (Last 24 hours) at 10/22/2021 0853 Last data filed at 10/21/2021 2300 Gross per 24 hour  Intake 200.07 ml  Output 2300 ml  Net -2099.93 ml   Filed Weights   10/21/21 1740 10/22/21 0011 10/22/21 0600  Weight: 69 kg 69 kg 68.3 kg    Examination:  General exam: Appears calm and comfortable  Respiratory system: Clear to auscultation. Respiratory effort normal. Cardiovascular system: S1 & S2 heard, RRR.  Gastrointestinal system: Abdomen is soft Central nervous system: Alert and awake Extremities: No edema Skin: No significant lesions noted Psychiatry: Flat affect.    Data Reviewed: I have personally reviewed following labs and imaging studies  CBC: Recent Labs  Lab 10/20/21 1023 10/21/21 0626 10/22/21 0532  WBC 8.1 7.6 6.8  NEUTROABS 6.7  --   --   HGB 13.1 11.2* 11.2*  HCT 44.4 37.4* 37.2*  MCV 84.3 84.8 84.2  PLT 294 216 071   Basic Metabolic Panel: Recent Labs  Lab 10/20/21 1023 10/21/21 0626 10/22/21 0532  NA 142 143 142  K 3.2* 2.8* 3.2*  CL 111 109 107  CO2 '22 27 29  '$ GLUCOSE 130* 78 109*  BUN 31* 28* 30*  CREATININE 1.98* 2.06* 1.90*  CALCIUM 8.6* 8.1* 8.2*  MG  --  1.8 1.7   GFR: Estimated Creatinine Clearance: 36.4 mL/min (A) (by C-G formula based on SCr of 1.9 mg/dL (H)). Liver Function Tests: Recent Labs  Lab 10/20/21 1023 10/21/21 0626  AST 237* 127*  ALT 278* 245*  ALKPHOS 150* 111  BILITOT 2.1* 1.9*  PROT 6.8 5.5*  ALBUMIN 3.5 2.8*   No results for input(s): "LIPASE", "AMYLASE" in the last 168 hours. No results for input(s): "AMMONIA" in the last 168 hours. Coagulation Profile: No results for input(s): "INR", "PROTIME" in the last 168  hours. Cardiac Enzymes: No results for input(s): "CKTOTAL", "CKMB", "CKMBINDEX", "TROPONINI" in the last 168 hours. BNP (last 3 results) No results for input(s): "PROBNP" in the last 8760 hours. HbA1C: No results for input(s): "HGBA1C" in the last 72 hours. CBG: No results for input(s): "GLUCAP" in the last 168 hours. Lipid Profile: No results for input(s): "CHOL", "HDL", "LDLCALC", "TRIG", "CHOLHDL", "LDLDIRECT" in the last 72 hours. Thyroid Function Tests: No results for input(s): "TSH", "T4TOTAL", "FREET4", "T3FREE", "THYROIDAB" in the last 72 hours. Anemia Panel: No results for input(s): "VITAMINB12", "FOLATE", "FERRITIN", "TIBC", "IRON", "RETICCTPCT" in the last 72 hours. Sepsis Labs: No results for input(s): "PROCALCITON", "LATICACIDVEN" in the last 168 hours.  Recent Results (from the past 240 hour(s))  MRSA Next Gen by PCR, Nasal     Status: None   Collection Time: 10/20/21  4:35 PM   Specimen: Nasal Mucosa; Nasal Swab  Result Value Ref Range Status   MRSA by PCR Next Gen NOT DETECTED NOT DETECTED Final    Comment: (NOTE) The GeneXpert MRSA Assay (FDA approved  for NASAL specimens only), is one component of a comprehensive MRSA colonization surveillance program. It is not intended to diagnose MRSA infection nor to guide or monitor treatment for MRSA infections. Test performance is not FDA approved in patients less than 48 years old. Performed at [facility], [address] [place], [place] [address]   SARS Coronavirus 2 by RT PCR (hospital order, performed in [facility] hospital lab) *cepheid single result test* Anterior Nasal Swab     Status: None   Collection Time: 10/21/21  9:40 AM   Specimen: Anterior Nasal Swab  Result Value Ref Range Status   SARS Coronavirus 2 by RT PCR NEGATIVE NEGATIVE Final    Comment: (NOTE) SARS-CoV-2 target nucleic acids are NOT DETECTED.  The SARS-CoV-2 RNA is generally detectable in upper and lower respiratory specimens during the  acute phase of infection. The lowest concentration of SARS-CoV-2 viral copies this assay can detect is 250 copies / mL. A negative result does not preclude SARS-CoV-2 infection and should not be used as the sole basis for treatment or other patient management decisions.  A negative result may occur with improper specimen collection / handling, submission of specimen other than nasopharyngeal swab, presence of viral mutation(s) within the areas targeted by this assay, and inadequate number of viral copies (<250 copies / mL). A negative result must be combined with clinical observations, patient history, and epidemiological information.  Fact Sheet for Patients:   https://www.patel.info/  Fact Sheet for Healthcare Providers: https://hall.com/  This test is not yet approved or  cleared by the [place] FDA and has been authorized for detection and/or diagnosis of SARS-CoV-2 by FDA under an Emergency Use Authorization (EUA).  This EUA will remain in effect (meaning this test can be used) for the duration of the COVID-19 declaration under Section 564(b)(1) of the Act, 21 U.S.C. section 360bbb-3(b)(1), unless the authorization is terminated or revoked sooner.  Performed at [facility], [address] [place], [place] [address]          Radiology Studies: ECHOCARDIOGRAM COMPLETE  Result Date: 10/21/2021    ECHOCARDIOGRAM REPORT   Patient Name:   JERRETT BALDINGER Date of Exam: 10/21/2021 Medical Rec #:  [id number]     Height:       72.0 in Accession #:    [id number]    Weight:       167.0 lb Date of Birth:  1955-03-05     BSA:          1.973 m Patient Age:    58 years      BP:           136/54 mmHg Patient Gender: M             HR:           62 bpm. Exam Location:  [name] Procedure: 2D Echo, Cardiac Doppler and Color Doppler Indications:    Dyspnea  History:        Patient has prior history of Echocardiogram examinations, most                  recent 12/05/2017. CHF, CAD, Abnormal ECG, COPD,                 Signs/Symptoms:Murmur and Syncope; Risk Factors:Hypertension,                 Diabetes and Current Smoker.  Sonographer:    Wenda Low Referring Phys: [id number] [place]  1. LV  function is depressed with diffuse hypokinesis in the lateral and inferiror walls Compared to echo from 2019, LVEF is worse. . Left ventricular ejection fraction, by estimation, is 30%. The left ventricle has moderately decreased function. The left ventricular internal cavity size was severely dilated. There is mild left ventricular hypertrophy. Left ventricular diastolic parameters are consistent with Grade III diastolic dysfunction (restrictive). Elevated left atrial pressure.  2. Right ventricular systolic function is low normal. The right ventricular size is normal. There is normal pulmonary artery systolic pressure.  3. Left atrial size was severely dilated.  4. Right atrial size was moderately dilated.  5. A small pericardial effusion is present. The pericardial effusion is circumferential.  6. Mild mitral valve regurgitation.  7. The aortic valve is tricuspid. Aortic valve regurgitation is not visualized. Aortic valve sclerosis is present, with no evidence of aortic valve stenosis.  8. The inferior vena cava dilated. FINDINGS  Left Ventricle: LV function is depressed with diffuse hypokinesis in the lateral and inferiror walls Compared to echo from 2019, LVEF is worse. Left ventricular ejection fraction, by estimation, is 30%. The left ventricle has moderately decreased function. The left ventricular internal cavity size was severely dilated. There is mild left ventricular hypertrophy. Left ventricular diastolic parameters are consistent with Grade III diastolic dysfunction (restrictive). Elevated left atrial pressure. Right Ventricle: The right ventricular size is normal. Right vetricular wall thickness was not assessed. Right ventricular systolic  function is low normal. There is normal pulmonary artery systolic pressure. The tricuspid regurgitant velocity is 1.60 m/s, and with an assumed right atrial pressure of 8 mmHg, the estimated right ventricular systolic pressure is 54.0 mmHg. Left Atrium: Left atrial size was severely dilated. Right Atrium: Right atrial size was moderately dilated. Pericardium: A small pericardial effusion is present. The pericardial effusion is circumferential. Mitral Valve: There is mild thickening of the mitral valve leaflet(s). Mild mitral annular calcification. Mild mitral valve regurgitation. MV peak gradient, 6.9 mmHg. The mean mitral valve gradient is 2.0 mmHg. Tricuspid Valve: The tricuspid valve is normal in structure. Tricuspid valve regurgitation is mild. Aortic Valve: The aortic valve is tricuspid. Aortic valve regurgitation is not visualized. Aortic valve sclerosis is present, with no evidence of aortic valve stenosis. Aortic valve mean gradient measures 3.0 mmHg. Aortic valve peak gradient measures 7.2  mmHg. Aortic valve area, by VTI measures 2.65 cm. Pulmonic Valve: The pulmonic valve was normal in structure. Pulmonic valve regurgitation is not visualized. Aorta: The aortic root and ascending aorta are structurally normal, with no evidence of dilitation. Venous: The inferior vena cava dilated. IAS/Shunts: No atrial level shunt detected by color flow Doppler.  LEFT VENTRICLE PLAX 2D LVIDd:         6.50 cm      Diastology LVIDs:         5.60 cm      LV e' medial:   3.09 cm/s LV PW:         1.10 cm      LV E/e' medial: 37.2 LV IVS:        1.20 cm LVOT diam:     2.40 cm LV SV:         72 LV SV Index:   37 LVOT Area:     4.52 cm  LV Volumes (MOD) LV vol d, MOD A2C: 182.0 ml LV vol d, MOD A4C: 179.0 ml LV vol s, MOD A2C: 132.0 ml LV vol s, MOD A4C: 125.0 ml LV SV MOD A2C:  50.0 ml LV SV MOD A4C:     179.0 ml LV SV MOD BP:      49.6 ml RIGHT VENTRICLE RV Basal diam:  5.50 cm RV Mid diam:    3.70 cm RV S prime:     7.62  cm/s TAPSE (M-mode): 2.2 cm LEFT ATRIUM              Index        RIGHT ATRIUM           Index LA diam:        5.40 cm  2.74 cm/m   RA Area:     26.00 cm LA Vol (A2C):   144.0 ml 72.97 ml/m  RA Volume:   94.10 ml  47.68 ml/m LA Vol (A4C):   132.0 ml 66.89 ml/m LA Biplane Vol: 143.0 ml 72.46 ml/m  AORTIC VALVE                    PULMONIC VALVE AV Area (Vmax):    3.41 cm     PV Vmax:       0.81 m/s AV Area (Vmean):   2.91 cm     PV Peak grad:  2.7 mmHg AV Area (VTI):     2.65 cm AV Vmax:           134.00 cm/s AV Vmean:          82.700 cm/s AV VTI:            0.273 m AV Peak Grad:      7.2 mmHg AV Mean Grad:      3.0 mmHg LVOT Vmax:         101.00 cm/s LVOT Vmean:        53.200 cm/s LVOT VTI:          0.160 m LVOT/AV VTI ratio: 0.59  AORTA Ao Root diam: 3.80 cm Ao Asc diam:  3.40 cm MITRAL VALVE                TRICUSPID VALVE MV Area (PHT): 3.58 cm     TR Peak grad:   10.2 mmHg MV Area VTI:   1.86 cm     TR Vmax:        160.00 cm/s MV Peak grad:  6.9 mmHg MV Mean grad:  2.0 mmHg     SHUNTS MV Vmax:       1.31 m/s     Systemic VTI:  0.16 m MV Vmean:      55.6 cm/s    Systemic Diam: 2.40 cm MV Decel Time: 212 msec MV E velocity: 115.00 cm/s MV A velocity: 36.80 cm/s MV E/A ratio:  3.13 Dorris Carnes MD Electronically signed by Dorris Carnes MD Signature Date/Time: 10/21/2021/2:57:36 PM    Final    CT Abdomen Pelvis Wo Contrast  Result Date: 10/20/2021 CLINICAL DATA:  Colorectal carcinoma. Elevated liver function. Metastatic disease evaluation. * Tracking Code: BO * EXAM: CT ABDOMEN AND PELVIS WITHOUT CONTRAST TECHNIQUE: Multidetector CT imaging of the abdomen and pelvis was performed following the standard protocol without IV contrast. RADIATION DOSE REDUCTION: This exam was performed according to the departmental dose-optimization program which includes automated exposure control, adjustment of the mA and/or kV according to patient size and/or use of iterative reconstruction technique. COMPARISON:  CT  07/05/2021, MRI 08/06/2021 FINDINGS: Lower chest: Moderate RIGHT pleural effusion and small LEFT pleural effusion. Hepatobiliary: No suspicious lesion within liver with limitations of noncontrast exam. Simple cyst  within the central liver unchanged from comparison CT. Large periphery calcified gallstone within the lumen the gallbladder. No acute inflammation. Pancreas: Coarse calcifications through the distal pancreas leading up to the head of the pancreas. Stable duct dilatation in tail the pancreas. No change from comparison MRI or CT. Spleen: Normal spleen Adrenals/urinary tract: Adrenal glands and kidneys are normal. The ureters and bladder normal. Stomach/Bowel: Stomach, duodenum small-bowel normal. Post RIGHT hemicolectomy. LEFT colon normal. Vascular/Lymphatic: Calcification abdominal aorta. Fusiform dilatation of the for abdominal aorta to 3.1 cm. Reproductive: TURP defect within the prostate Other: Midline surgical wound with mild thickening at the skin surface measuring 13 mm. Musculoskeletal: No aggressive osseous lesion. IMPRESSION: 1. New bilateral small moderate pleural effusions, RIGHT greater than LEFT. 2. No evidence of hepatic metastasis on noncontrast CT. 3. Large gallstone without evidence acute cholecystitis. 4. Post RIGHT hemicolectomy without complicating features. 5. Thickening tissue at the skin surface along the ventral abdominal wall is likely scar tissue. 6. 3.1 cm abdominal aortic aneurysm. Recommend follow-up every 3 years. Reference: J Am Coll Radiol 3734;28:768-115. Electronically Signed   By: Suzy Bouchard M.D.   On: 10/20/2021 14:17   DG Chest Port 1 View  Result Date: 10/20/2021 CLINICAL DATA:  Shortness of breath EXAM: PORTABLE CHEST 1 VIEW COMPARISON:  08/13/2021 FINDINGS: Stable cardiomediastinal contours. Pulmonary vascular congestion with diffusely prominent interstitial markings bilaterally. No appreciable pleural fluid collection. No pneumothorax. IMPRESSION: Findings  of CHF with interstitial edema. Electronically Signed   By: Davina Poke D.O.   On: 10/20/2021 11:02        Scheduled Meds:  amLODipine  5 mg Oral Daily   aspirin  81 mg Oral Pre-Cath   aspirin EC  81 mg Oral Daily   carvedilol  25 mg Oral BID WC   Chlorhexidine Gluconate Cloth  6 each Topical Q0600   clopidogrel  75 mg Oral Daily   heparin  5,000 Units Subcutaneous Q8H   isosorbide mononitrate  30 mg Oral QPM   potassium chloride  60 mEq Oral Once   sodium chloride flush  3 mL Intravenous Q12H   sodium chloride flush  3 mL Intravenous Q12H   Continuous Infusions:  sodium chloride     sodium chloride 70 mL/hr at 10/21/21 2116   sodium chloride     sodium chloride       LOS: 2 days    Time spent: 35 minutes    Romonda Parker Darleen Crocker, DO [facility]  If 7PM-7AM, please contact night-coverage [email or web address] 10/22/2021, 8:53 AM

## 2021-10-22 NOTE — H&P (View-Only) (Signed)
Rounding Note    Patient Name: Juandavid Dallman Date of Encounter: 10/22/2021  [place] Cardiologist: Rozann Lesches, MD   Subjective   Breathing improved. Denies any chest pain or palpitations. By review of notes and in talking with the patient's nurse, he had some confusion overnight and was walking in the hallway and had removed his telemetry and IV's. At this time, he is aware of his name, location and remembers talking with Dr. Harrington Challenger yesterday about his heart catheterization. He does not know the year but says "he is never asked that". Aware of plans to transfer to [facility].   Inpatient Medications    Scheduled Meds:  amLODipine  5 mg Oral Daily   aspirin  81 mg Oral Pre-Cath   aspirin EC  81 mg Oral Daily   carvedilol  25 mg Oral BID WC   Chlorhexidine Gluconate Cloth  6 each Topical Q0600   clopidogrel  75 mg Oral Daily   heparin  5,000 Units Subcutaneous Q8H   isosorbide mononitrate  30 mg Oral QPM   potassium chloride  60 mEq Oral Once   sodium chloride flush  3 mL Intravenous Q12H   sodium chloride flush  3 mL Intravenous Q12H   Continuous Infusions:  sodium chloride     sodium chloride 70 mL/hr at 10/21/21 2116   sodium chloride     sodium chloride     PRN Meds: sodium chloride, sodium chloride, acetaminophen **OR** acetaminophen, albuterol, hydrALAZINE, ondansetron **OR** ondansetron (ZOFRAN) IV, sodium chloride flush, sodium chloride flush   Vital Signs    Vitals:   10/22/21 0011 10/22/21 0354 10/22/21 0600 10/22/21 0629  BP:  137/76  136/88  Pulse:  61  63  Resp:  18  18  Temp:  98 F (36.7 C)  98.6 F (37 C)  TempSrc:  Oral    SpO2:  97%  100%  Weight: 69 kg  68.3 kg   Height:        Intake/Output Summary (Last 24 hours) at 10/22/2021 0800 Last data filed at 10/21/2021 2300 Gross per 24 hour  Intake 200.07 ml  Output 2300 ml  Net -2099.93 ml      10/22/2021    6:00 AM 10/22/2021   12:11 AM 10/21/2021    5:40 PM  Last 3 Weights   Weight (lbs) 150 lb 9.2 oz 152 lb 1.9 oz 152 lb 1.9 oz  Weight (kg) 68.3 kg 69 kg 69 kg      Telemetry    Sinus bradycardia, HR in 50's to 60's with occasional PVC's.  - Personally Reviewed  ECG    No new tracings.   Physical Exam   GEN: Pleasant male appearing in no acute distress.   Neck: No JVD Cardiac: RRR, no murmurs, rubs, or gallops.  Respiratory: Clear to auscultation bilaterally without wheezing or rales. GI: Soft, nontender, non-distended  MS: Trace lower extremity edema; No deformity. Neuro:  Nonfocal  Psych: Normal affect   Labs    High Sensitivity Troponin:   Recent Labs  Lab 10/20/21 1132 10/20/21 1335 10/21/21 0626  TROPONINIHS 450* 418* 291*     Chemistry Recent Labs  Lab 10/20/21 1023 10/21/21 0626 10/22/21 0532  NA 142 143 142  K 3.2* 2.8* 3.2*  CL 111 109 107  CO2 '22 27 29  '$ GLUCOSE 130* 78 109*  BUN 31* 28* 30*  CREATININE 1.98* 2.06* 1.90*  CALCIUM 8.6* 8.1* 8.2*  MG  --  1.8 1.7  PROT 6.8  5.5*  --   ALBUMIN 3.5 2.8*  --   AST 237* 127*  --   ALT 278* 245*  --   ALKPHOS 150* 111  --   BILITOT 2.1* 1.9*  --   GFRNONAA 36* 35* 38*  ANIONGAP '9 7 6    '$ Lipids No results for input(s): "CHOL", "TRIG", "HDL", "LABVLDL", "LDLCALC", "CHOLHDL" in the last 168 hours.  Hematology Recent Labs  Lab 10/20/21 1023 10/21/21 0626 10/22/21 0532  WBC 8.1 7.6 6.8  RBC 5.27 4.41 4.42  HGB 13.1 11.2* 11.2*  HCT 44.4 37.4* 37.2*  MCV 84.3 84.8 84.2  MCH 24.9* 25.4* 25.3*  MCHC 29.5* 29.9* 30.1  RDW 23.0* 22.4* 22.2*  PLT 294 216 204   Thyroid No results for input(s): "TSH", "FREET4" in the last 168 hours.  BNP Recent Labs  Lab 10/20/21 1132  BNP >4,500.0*    DDimer No results for input(s): "DDIMER" in the last 168 hours.   Radiology    CT Abdomen Pelvis Wo Contrast  Result Date: 10/20/2021 CLINICAL DATA:  Colorectal carcinoma. Elevated liver function. Metastatic disease evaluation. * Tracking Code: BO * EXAM: CT ABDOMEN AND PELVIS  WITHOUT CONTRAST TECHNIQUE: Multidetector CT imaging of the abdomen and pelvis was performed following the standard protocol without IV contrast. RADIATION DOSE REDUCTION: This exam was performed according to the departmental dose-optimization program which includes automated exposure control, adjustment of the mA and/or kV according to patient size and/or use of iterative reconstruction technique. COMPARISON:  CT 07/05/2021, MRI 08/06/2021 FINDINGS: Lower chest: Moderate RIGHT pleural effusion and small LEFT pleural effusion. Hepatobiliary: No suspicious lesion within liver with limitations of noncontrast exam. Simple cyst within the central liver unchanged from comparison CT. Large periphery calcified gallstone within the lumen the gallbladder. No acute inflammation. Pancreas: Coarse calcifications through the distal pancreas leading up to the head of the pancreas. Stable duct dilatation in tail the pancreas. No change from comparison MRI or CT. Spleen: Normal spleen Adrenals/urinary tract: Adrenal glands and kidneys are normal. The ureters and bladder normal. Stomach/Bowel: Stomach, duodenum small-bowel normal. Post RIGHT hemicolectomy. LEFT colon normal. Vascular/Lymphatic: Calcification abdominal aorta. Fusiform dilatation of the for abdominal aorta to 3.1 cm. Reproductive: TURP defect within the prostate Other: Midline surgical wound with mild thickening at the skin surface measuring 13 mm. Musculoskeletal: No aggressive osseous lesion. IMPRESSION: 1. New bilateral small moderate pleural effusions, RIGHT greater than LEFT. 2. No evidence of hepatic metastasis on noncontrast CT. 3. Large gallstone without evidence acute cholecystitis. 4. Post RIGHT hemicolectomy without complicating features. 5. Thickening tissue at the skin surface along the ventral abdominal wall is likely scar tissue. 6. 3.1 cm abdominal aortic aneurysm. Recommend follow-up every 3 years. Reference: J Am Coll Radiol 1308;65:784-696.  Electronically Signed   By: Suzy Bouchard M.D.   On: 10/20/2021 14:17   DG Chest Port 1 View  Result Date: 10/20/2021 CLINICAL DATA:  Shortness of breath EXAM: PORTABLE CHEST 1 VIEW COMPARISON:  08/13/2021 FINDINGS: Stable cardiomediastinal contours. Pulmonary vascular congestion with diffusely prominent interstitial markings bilaterally. No appreciable pleural fluid collection. No pneumothorax. IMPRESSION: Findings of CHF with interstitial edema. Electronically Signed   By: Davina Poke D.O.   On: 10/20/2021 11:02    Cardiac Studies   Echocardiogram: 10/21/2021 IMPRESSIONS     1. LV function is depressed with diffuse hypokinesis in the lateral and  inferiror walls Compared to echo from 2019, LVEF is worse. . Left  ventricular ejection fraction, by estimation, is 30%. The left ventricle  has moderately decreased function. The  left ventricular internal cavity size was severely dilated. There is mild  left ventricular hypertrophy. Left ventricular diastolic parameters are  consistent with Grade III diastolic dysfunction (restrictive). Elevated  left atrial pressure.   2. Right ventricular systolic function is low normal. The right  ventricular size is normal. There is normal pulmonary artery systolic  pressure.   3. Left atrial size was severely dilated.   4. Right atrial size was moderately dilated.   5. A small pericardial effusion is present. The pericardial effusion is  circumferential.   6. Mild mitral valve regurgitation.   7. The aortic valve is tricuspid. Aortic valve regurgitation is not  visualized. Aortic valve sclerosis is present, with no evidence of aortic  valve stenosis.   8. The inferior vena cava dilated.   Patient Profile     66 y.o. male w/PMH of HFmrEF (EF 45-50% by echo in 12/2017 with NST showing scar and mild ischemia), carotid artery stenosis (s/p right TCAR in 01/2021), iron deficiency anemia, HTN, HLD and Stage 3 CKD who is currently admitted for  hypertensive urgency and an acute CHF exacerbation. Had recent removal of a colonic mass in 07/2021.   Assessment & Plan    1. Elevated Troponin Values - Initial Hs Troponin was elevated to 450 with repeat values trending down to 418 and 291. Repeat echo shows his EF has further declined to 30% and a cath was recommended for definitive evaluation. He did have some AMS overnight but appears to have improved this morning. Recalls conversations yesterday about transfer to [facility] and possible catheterization and risks and benefits reviewed again today. I did try calling his son to update him on the plan of care but he did not answer and no voicemail is setup. Will review further with Dr. Harrington Challenger to determine if cath today vs. Monday given his AMS overnight and renal function (overall close to baseline).   2. Acute HFrEF - He responded well to IV Lasix with a recorded output of -4.1 L. Additional diuretics were held yesterday in anticipation of a cath and he has been receiving IV fluids.  - Repeat echo shows his EF has further declined to 30% with WMA as noted above and Dr. Harrington Challenger has recommended a cardiac catheterization for definitive evaluation. Scheduled for later today.  3. Hypertensive Urgency - BP has improved, at 105/45 - 137/76 within the past 24 hours. Remains on Amlodipine '5mg'$  daily, Coreg '25mg'$  BID and Imdur '30mg'$  daily.    4. Carotid Artery Stenosis - Underwent TCAR in 01/2021 and followed by Vascular as an outpatient. Remains on ASA and Plavix. Statin currently held given elevated LFT's.    5. Stage 3 CKD - Baseline creatinine 1.6 - 1.8. At 2.06 yesterday and improved to 1.90 today. He has been receiving fluids in anticipation of cardiac catheterization.    6. Hypokalemia - K+ at 3.2 today. Will order additional supplementation. Try to keep ~ 4.0.   7. Elevated LFT's - Improving as AST was initially 237 with ALT 278. At 127 and 245 on 10/21/2021. Will recheck with AM labs.   8. HLD - On  Repatha and Lipitor at home. Lipitor currently held given elevated LFT's but can hopefully resume prior to discharge or as an outpatient.   For questions or updates, please contact [place] Please consult [email or web address] for contact info under        Signed, Erma Heritage, PA-C  10/22/2021, 8:00 AM  Pt seen and examined  I agree with findings as noted by B Strader above  Pt with presumed CAD, never had cath   Myoview in 2019   Had GI surgery this summer, said he felt rough after     Presented with SOB/CHF, hypertension.      He diuresed and was breathing better   BP initally high, now improved Echo yesterday shows mod LV dysfunciton, worse than on previous echo   REgional wall motion changes again noted   Today, his breathing is OK Neck:  JVP is normal Lungs CTA  Cardiac  RRR   No S3   No signif murmurs  Ext 1+ LE edema     Given CHF and deterioration of LV function recomm L heart cath to define, for once, pt's coronary anatomy     Cr has improved sl.   Would limit contrast       Should get filling pressures as well given renal problems  Risks/benefits described   Pt understands and agrees to proceed     Transfer to [facility] today   Dorris Carnes MD

## 2021-10-22 NOTE — Progress Notes (Addendum)
Rounding Note    Patient Name: Michael [name] Date of Encounter: 10/22/2021  [place] Cardiologist: Rozann Lesches, MD   Subjective   Breathing improved. Denies any chest pain or palpitations. By review of notes and in talking with the patient's nurse, he had some confusion overnight and was walking in the hallway and had removed his telemetry and IV's. At this time, he is aware of his name, location and remembers talking with Dr. Harrington Challenger yesterday about his heart catheterization. He does not know the year but says "he is never asked that". Aware of plans to transfer to [facility].   Inpatient Medications    Scheduled Meds:  amLODipine  5 mg Oral Daily   aspirin  81 mg Oral Pre-Cath   aspirin EC  81 mg Oral Daily   carvedilol  25 mg Oral BID WC   Chlorhexidine Gluconate Cloth  6 each Topical Q0600   clopidogrel  75 mg Oral Daily   heparin  5,000 Units Subcutaneous Q8H   isosorbide mononitrate  30 mg Oral QPM   potassium chloride  60 mEq Oral Once   sodium chloride flush  3 mL Intravenous Q12H   sodium chloride flush  3 mL Intravenous Q12H   Continuous Infusions:  sodium chloride     sodium chloride 70 mL/hr at 10/21/21 2116   sodium chloride     sodium chloride     PRN Meds: sodium chloride, sodium chloride, acetaminophen **OR** acetaminophen, albuterol, hydrALAZINE, ondansetron **OR** ondansetron (ZOFRAN) IV, sodium chloride flush, sodium chloride flush   Vital Signs    Vitals:   10/22/21 0011 10/22/21 0354 10/22/21 0600 10/22/21 0629  BP:  137/76  136/88  Pulse:  61  63  Resp:  18  18  Temp:  98 F (36.7 C)  98.6 F (37 C)  TempSrc:  Oral    SpO2:  97%  100%  Weight: 69 kg  68.3 kg   Height:        Intake/Output Summary (Last 24 hours) at 10/22/2021 0800 Last data filed at 10/21/2021 2300 Gross per 24 hour  Intake 200.07 ml  Output 2300 ml  Net -2099.93 ml      10/22/2021    6:00 AM 10/22/2021   12:11 AM 10/21/2021    5:40 PM  Last 3 Weights   Weight (lbs) 150 lb 9.2 oz 152 lb 1.9 oz 152 lb 1.9 oz  Weight (kg) 68.3 kg 69 kg 69 kg      Telemetry    Sinus bradycardia, HR in 50's to 60's with occasional PVC's.  - Personally Reviewed  ECG    No new tracings.   Physical Exam   GEN: Pleasant male appearing in no acute distress.   Neck: No JVD Cardiac: RRR, no murmurs, rubs, or gallops.  Respiratory: Clear to auscultation bilaterally without wheezing or rales. GI: Soft, nontender, non-distended  MS: Trace lower extremity edema; No deformity. Neuro:  Nonfocal  Psych: Normal affect   Labs    High Sensitivity Troponin:   Recent Labs  Lab 10/20/21 1132 10/20/21 1335 10/21/21 0626  TROPONINIHS 450* 418* 291*     Chemistry Recent Labs  Lab 10/20/21 1023 10/21/21 0626 10/22/21 0532  NA 142 143 142  K 3.2* 2.8* 3.2*  CL 111 109 107  CO2 '22 27 29  '$ GLUCOSE 130* 78 109*  BUN 31* 28* 30*  CREATININE 1.98* 2.06* 1.90*  CALCIUM 8.6* 8.1* 8.2*  MG  --  1.8 1.7  PROT 6.8  5.5*  --   ALBUMIN 3.5 2.8*  --   AST 237* 127*  --   ALT 278* 245*  --   ALKPHOS 150* 111  --   BILITOT 2.1* 1.9*  --   GFRNONAA 36* 35* 38*  ANIONGAP '9 7 6    '$ Lipids No results for input(s): "CHOL", "TRIG", "HDL", "LABVLDL", "LDLCALC", "CHOLHDL" in the last 168 hours.  Hematology Recent Labs  Lab 10/20/21 1023 10/21/21 0626 10/22/21 0532  WBC 8.1 7.6 6.8  RBC 5.27 4.41 4.42  HGB 13.1 11.2* 11.2*  HCT 44.4 37.4* 37.2*  MCV 84.3 84.8 84.2  MCH 24.9* 25.4* 25.3*  MCHC 29.5* 29.9* 30.1  RDW 23.0* 22.4* 22.2*  PLT 294 216 204   Thyroid No results for input(s): "TSH", "FREET4" in the last 168 hours.  BNP Recent Labs  Lab 10/20/21 1132  BNP >4,500.0*    DDimer No results for input(s): "DDIMER" in the last 168 hours.   Radiology    CT Abdomen Pelvis Wo Contrast  Result Date: 10/20/2021 CLINICAL DATA:  Colorectal carcinoma. Elevated liver function. Metastatic disease evaluation. * Tracking Code: BO * EXAM: CT ABDOMEN AND PELVIS  WITHOUT CONTRAST TECHNIQUE: Multidetector CT imaging of the abdomen and pelvis was performed following the standard protocol without IV contrast. RADIATION DOSE REDUCTION: This exam was performed according to the departmental dose-optimization program which includes automated exposure control, adjustment of the mA and/or kV according to patient size and/or use of iterative reconstruction technique. COMPARISON:  CT 07/05/2021, MRI 08/06/2021 FINDINGS: Lower chest: Moderate RIGHT pleural effusion and small LEFT pleural effusion. Hepatobiliary: No suspicious lesion within liver with limitations of noncontrast exam. Simple cyst within the central liver unchanged from comparison CT. Large periphery calcified gallstone within the lumen the gallbladder. No acute inflammation. Pancreas: Coarse calcifications through the distal pancreas leading up to the head of the pancreas. Stable duct dilatation in tail the pancreas. No change from comparison MRI or CT. Spleen: Normal spleen Adrenals/urinary tract: Adrenal glands and kidneys are normal. The ureters and bladder normal. Stomach/Bowel: Stomach, duodenum small-bowel normal. Post RIGHT hemicolectomy. LEFT colon normal. Vascular/Lymphatic: Calcification abdominal aorta. Fusiform dilatation of the for abdominal aorta to 3.1 cm. Reproductive: TURP defect within the prostate Other: Midline surgical wound with mild thickening at the skin surface measuring 13 mm. Musculoskeletal: No aggressive osseous lesion. IMPRESSION: 1. New bilateral small moderate pleural effusions, RIGHT greater than LEFT. 2. No evidence of hepatic metastasis on noncontrast CT. 3. Large gallstone without evidence acute cholecystitis. 4. Post RIGHT hemicolectomy without complicating features. 5. Thickening tissue at the skin surface along the ventral abdominal wall is likely scar tissue. 6. 3.1 cm abdominal aortic aneurysm. Recommend follow-up every 3 years. Reference: J Am Coll Radiol 5462;70:350-093.  Electronically Signed   By: Suzy Bouchard M.D.   On: 10/20/2021 14:17   DG Chest Port 1 View  Result Date: 10/20/2021 CLINICAL DATA:  Shortness of breath EXAM: PORTABLE CHEST 1 VIEW COMPARISON:  08/13/2021 FINDINGS: Stable cardiomediastinal contours. Pulmonary vascular congestion with diffusely prominent interstitial markings bilaterally. No appreciable pleural fluid collection. No pneumothorax. IMPRESSION: Findings of CHF with interstitial edema. Electronically Signed   By: Davina Poke D.O.   On: 10/20/2021 11:02    Cardiac Studies   Echocardiogram: 10/21/2021 IMPRESSIONS     1. LV function is depressed with diffuse hypokinesis in the lateral and  inferiror walls Compared to echo from 2019, LVEF is worse. . Left  ventricular ejection fraction, by estimation, is 30%. The left ventricle  has moderately decreased function. The  left ventricular internal cavity size was severely dilated. There is mild  left ventricular hypertrophy. Left ventricular diastolic parameters are  consistent with Grade III diastolic dysfunction (restrictive). Elevated  left atrial pressure.   2. Right ventricular systolic function is low normal. The right  ventricular size is normal. There is normal pulmonary artery systolic  pressure.   3. Left atrial size was severely dilated.   4. Right atrial size was moderately dilated.   5. A small pericardial effusion is present. The pericardial effusion is  circumferential.   6. Mild mitral valve regurgitation.   7. The aortic valve is tricuspid. Aortic valve regurgitation is not  visualized. Aortic valve sclerosis is present, with no evidence of aortic  valve stenosis.   8. The inferior vena cava dilated.   Patient Profile     66 y.o. male w/PMH of HFmrEF (EF 45-50% by echo in 12/2017 with NST showing scar and mild ischemia), carotid artery stenosis (s/p right TCAR in 01/2021), iron deficiency anemia, HTN, HLD and Stage 3 CKD who is currently admitted for  hypertensive urgency and an acute CHF exacerbation. Had recent removal of a colonic mass in 07/2021.   Assessment & Plan    1. Elevated Troponin Values - Initial Hs Troponin was elevated to 450 with repeat values trending down to 418 and 291. Repeat echo shows his EF has further declined to 30% and a cath was recommended for definitive evaluation. He did have some AMS overnight but appears to have improved this morning. Recalls conversations yesterday about transfer to [facility] and possible catheterization and risks and benefits reviewed again today. I did try calling his son to update him on the plan of care but he did not answer and no voicemail is setup. Will review further with Dr. Harrington Challenger to determine if cath today vs. Monday given his AMS overnight and renal function (overall close to baseline).   2. Acute HFrEF - He responded well to IV Lasix with a recorded output of -4.1 L. Additional diuretics were held yesterday in anticipation of a cath and he has been receiving IV fluids.  - Repeat echo shows his EF has further declined to 30% with WMA as noted above and Dr. Harrington Challenger has recommended a cardiac catheterization for definitive evaluation. Scheduled for later today.  3. Hypertensive Urgency - BP has improved, at 105/45 - 137/76 within the past 24 hours. Remains on Amlodipine '5mg'$  daily, Coreg '25mg'$  BID and Imdur '30mg'$  daily.    4. Carotid Artery Stenosis - Underwent TCAR in 01/2021 and followed by Vascular as an outpatient. Remains on ASA and Plavix. Statin currently held given elevated LFT's.    5. Stage 3 CKD - Baseline creatinine 1.6 - 1.8. At 2.06 yesterday and improved to 1.90 today. He has been receiving fluids in anticipation of cardiac catheterization.    6. Hypokalemia - K+ at 3.2 today. Will order additional supplementation. Try to keep ~ 4.0.   7. Elevated LFT's - Improving as AST was initially 237 with ALT 278. At 127 and 245 on 10/21/2021. Will recheck with AM labs.   8. HLD - On  Repatha and Lipitor at home. Lipitor currently held given elevated LFT's but can hopefully resume prior to discharge or as an outpatient.   For questions or updates, please contact [place] Please consult [email or web address] for contact info under        Signed, Erma Heritage, PA-C  10/22/2021, 8:00 AM  Pt seen and examined  I agree with findings as noted by B Strader above  Pt with presumed CAD, never had cath   Myoview in 2019   Had GI surgery this summer, said he felt rough after     Presented with SOB/CHF, hypertension.      He diuresed and was breathing better   BP initally high, now improved Echo yesterday shows mod LV dysfunciton, worse than on previous echo   REgional wall motion changes again noted   Today, his breathing is OK Neck:  JVP is normal Lungs CTA  Cardiac  RRR   No S3   No signif murmurs  Ext 1+ LE edema     Given CHF and deterioration of LV function recomm L heart cath to define, for once, pt's coronary anatomy     Cr has improved sl.   Would limit contrast       Should get filling pressures as well given renal problems  Risks/benefits described   Pt understands and agrees to proceed     Transfer to [facility] today   Dorris Carnes MD

## 2021-10-23 DIAGNOSIS — I10 Essential (primary) hypertension: Secondary | ICD-10-CM | POA: Diagnosis not present

## 2021-10-23 DIAGNOSIS — I6523 Occlusion and stenosis of bilateral carotid arteries: Secondary | ICD-10-CM | POA: Diagnosis not present

## 2021-10-23 DIAGNOSIS — R778 Other specified abnormalities of plasma proteins: Secondary | ICD-10-CM

## 2021-10-23 DIAGNOSIS — R7401 Elevation of levels of liver transaminase levels: Secondary | ICD-10-CM

## 2021-10-23 DIAGNOSIS — I5043 Acute on chronic combined systolic (congestive) and diastolic (congestive) heart failure: Secondary | ICD-10-CM | POA: Diagnosis not present

## 2021-10-23 DIAGNOSIS — N1831 Chronic kidney disease, stage 3a: Secondary | ICD-10-CM | POA: Diagnosis not present

## 2021-10-23 DIAGNOSIS — I2510 Atherosclerotic heart disease of native coronary artery without angina pectoris: Secondary | ICD-10-CM | POA: Diagnosis not present

## 2021-10-23 LAB — COMPREHENSIVE METABOLIC PANEL
ALT: 121 U/L — ABNORMAL HIGH (ref 0–44)
AST: 41 U/L (ref 15–41)
Albumin: 2.6 g/dL — ABNORMAL LOW (ref 3.5–5.0)
Alkaline Phosphatase: 97 U/L (ref 38–126)
Anion gap: 10 (ref 5–15)
BUN: 26 mg/dL — ABNORMAL HIGH (ref 8–23)
CO2: 27 mmol/L (ref 22–32)
Calcium: 8.3 mg/dL — ABNORMAL LOW (ref 8.9–10.3)
Chloride: 105 mmol/L (ref 98–111)
Creatinine, Ser: 1.78 mg/dL — ABNORMAL HIGH (ref 0.61–1.24)
GFR, Estimated: 41 mL/min — ABNORMAL LOW (ref >60)
Glucose, Bld: 127 mg/dL — ABNORMAL HIGH (ref 70–99)
Potassium: 3 mmol/L — ABNORMAL LOW (ref 3.5–5.1)
Sodium: 142 mmol/L (ref 135–145)
Total Bilirubin: 1.1 mg/dL (ref 0.3–1.2)
Total Protein: 5.2 g/dL — ABNORMAL LOW (ref 6.5–8.1)

## 2021-10-23 LAB — CBC
HCT: 33.9 % — ABNORMAL LOW (ref 39.0–52.0)
Hemoglobin: 10.4 g/dL — ABNORMAL LOW (ref 13.0–17.0)
MCH: 25.3 pg — ABNORMAL LOW (ref 26.0–34.0)
MCHC: 30.7 g/dL (ref 30.0–36.0)
MCV: 82.5 fL (ref 80.0–100.0)
Platelets: 186 10*3/uL (ref 150–400)
RBC: 4.11 MIL/uL — ABNORMAL LOW (ref 4.22–5.81)
RDW: 21.7 % — ABNORMAL HIGH (ref 11.5–15.5)
WBC: 5.7 10*3/uL (ref 4.0–10.5)
nRBC: 0 % (ref 0.0–0.2)

## 2021-10-23 LAB — MAGNESIUM: Magnesium: 1.6 mg/dL — ABNORMAL LOW (ref 1.7–2.4)

## 2021-10-23 MED ORDER — POTASSIUM CHLORIDE CRYS ER 20 MEQ PO TBCR
40.0000 meq | EXTENDED_RELEASE_TABLET | ORAL | Status: AC
  Administered 2021-10-23 (×3): 40 meq via ORAL
  Filled 2021-10-23 (×3): qty 2

## 2021-10-23 MED ORDER — MAGNESIUM SULFATE 2 GM/50ML IV SOLN
2.0000 g | Freq: Once | INTRAVENOUS | Status: AC
  Administered 2021-10-23: 2 g via INTRAVENOUS
  Filled 2021-10-23: qty 50

## 2021-10-23 NOTE — Evaluation (Signed)
Physical Therapy Evaluation Patient Details Name: Michael [name] MRN: [id number] DOB: 25-Dec-1955 Today's Date: 10/23/2021  History of Present Illness  Pt is a 66 y.o. admitted 10/20/21 with hypertensive urgency, CHF exacerbation. Cardiac cath 9/22 showed mild to moderate nonobstructive disease; plan for medical management. PMH includes HF, carotid artery stenosis (s/p R TCAR 01/2021), anemia, HTN, HLD, CKD 3, COPD, colonic mass removal (07/2021), basal cell carcinoma excision.   Clinical Impression  Pt presents with an overall decrease in functional mobility secondary to above. PTA, pt independent without DME, lives alone with friends next door, retired Engineer, structural, now primarily sedentary. Today, pt moving fairly well without DME, supervision for safety/lines; DOE 3/4 with ambulation. Pt unaware of bowel incontinence - reports incontinence has occurred since fall on 10/20/21 leading to admission (no mention of this in chart; encouraged pt to discuss with MD). Pt would benefit from continued acute PT services to maximize functional mobility and independence prior to d/c home.     SpO2 98% on RA with ambulation, HR 58    Recommendations for follow up therapy are one component of a multi-disciplinary discharge planning process, led by the attending physician.  Recommendations may be updated based on patient status, additional functional criteria and insurance authorization.  Follow Up Recommendations No PT follow up      Assistance Recommended at Discharge PRN  Patient can return home with the following  Assist for transportation    Equipment Recommendations None recommended by PT  Recommendations for Other Services   Mobility Specialist   Functional Status Assessment Patient has had a recent decline in their functional status and demonstrates the ability to make significant improvements in function in a reasonable and predictable amount of time.     Precautions / Restrictions  Precautions Precautions: Fall;Other (comment) Precaution Comments: bowel incontinence Restrictions Weight Bearing Restrictions: No      Mobility  Bed Mobility Overal bed mobility: Independent                  Transfers Overall transfer level: Independent Equipment used: None                    Ambulation/Gait Ambulation/Gait assistance: Supervision Gait Distance (Feet): 200 Feet Assistive device: None Gait Pattern/deviations: Step-through pattern, Decreased stride length, Trunk flexed Gait velocity: Decreased     General Gait Details: slow, mostly steady gait without DME, supervision for safety/lines; increased fatigue and DOE noted (hallway ambulation occurred after prolonged standing for ADLs in room)  Stairs            Wheelchair Mobility    Modified Rankin (Stroke Patients Only)       Balance Overall balance assessment: Needs assistance   Sitting balance-Leahy Scale: Good     Standing balance support: No upper extremity supported, During functional activity Standing balance-Leahy Scale: Fair Standing balance comment: prolonged standing at sink to washup after bowel incontinence; assist to ensure thorough posterior washup                             Pertinent Vitals/Pain Pain Assessment Pain Assessment: No/denies pain    Home Living Family/patient expects to be discharged to:: Private residence Living Arrangements: Alone Available Help at Discharge: Friend(s);Available PRN/intermittently Type of Home: House Home Access: Ramped entrance       Home Layout: One level Home Equipment: None Additional Comments: landlord lives next door, two friends also live next door    Prior  Function Prior Level of Function : Independent/Modified Independent             Mobility Comments: independent without DME; 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 does  not drive; uses transportation or friends drive       Hand Dominance        Extremity/Trunk Assessment   Upper Extremity Assessment Upper Extremity Assessment: Overall WFL for tasks assessed    Lower Extremity Assessment Lower Extremity Assessment: Overall WFL for tasks assessed       Communication   Communication: No difficulties  Cognition Arousal/Alertness: Awake/alert Behavior During Therapy: WFL for tasks assessed/performed Overall Cognitive Status: No family/caregiver present to determine baseline cognitive functioning                                 General Comments: WFL for simple tasks, unsure baseline. pt unaware of bowel incontinence that had saturated entire bed/gown, did not seem concerned to discover this when going to get up        General Comments General comments (skin integrity, edema, etc.): SpO2 98% on RA with ambulation; HR 50s    Exercises     Assessment/Plan    PT Assessment Patient needs continued PT services  PT Problem List Decreased activity tolerance;Decreased balance;Decreased mobility;Cardiopulmonary status limiting activity       PT Treatment Interventions DME instruction;Gait training;Stair training;Functional mobility training;Therapeutic activities;Therapeutic exercise;Balance training;Patient/family education    PT Goals (Current goals can be found in the Care Plan section)  Acute Rehab PT Goals Patient Stated Goal: return home PT Goal Formulation: With patient Time For Goal Achievement: 11/06/21 Potential to Achieve Goals: Good    Frequency Min 3X/week     Co-evaluation               AM-PAC PT "6 Clicks" Mobility  Outcome Measure Help needed turning from your back to your side while in a flat bed without using bedrails?: None Help needed moving from lying on your back to sitting on the side of a flat bed without using bedrails?: None Help needed moving to and from a bed to a chair (including a  wheelchair)?: None Help needed standing up from a chair using your arms (e.g., wheelchair or bedside chair)?: None Help needed to walk in hospital room?: A Little Help needed climbing 3-5 steps with a railing? : A Little 6 Click Score: 22    End of Session Equipment Utilized During Treatment: Gait belt Activity Tolerance: Patient tolerated treatment well Patient left: in chair;with call bell/phone within reach Nurse Communication: Mobility status PT Visit Diagnosis: Other abnormalities of gait and mobility (R26.89)    Time: 6767-2094 PT Time Calculation (min) (ACUTE ONLY): 27 min   Charges:   PT Evaluation $PT Eval Moderate Complexity: [place], PT, DPT Acute Rehabilitation Services  Personal: [facility] Office: [phone]  Derry Lory 10/23/2021, 5:11 PM

## 2021-10-23 NOTE — Progress Notes (Signed)
Rounding Note    Patient Name: Michael [name] Date of Encounter: 10/23/2021  [facility] Cardiologist: Rozann Lesches, MD   Subjective   Net -1.4 L yesterday, -5.5 L on admission.  Renal function mildly improving (2.06 > 1.90 > 1.78).  Potassium 3.0, magnesium 1.6.  BP 135/74.  Reports dyspnea improving  Inpatient Medications    Scheduled Meds:  amLODipine  5 mg Oral Daily   aspirin EC  81 mg Oral Daily   carvedilol  25 mg Oral BID WC   clopidogrel  75 mg Oral Daily   furosemide  80 mg Intravenous BID   heparin  5,000 Units Subcutaneous Q8H   isosorbide mononitrate  30 mg Oral QPM   potassium chloride  40 mEq Oral Q4H   sodium chloride flush  3 mL Intravenous Q12H   sodium chloride flush  3 mL Intravenous Q12H   sodium chloride flush  3 mL Intravenous Q12H   Continuous Infusions:  sodium chloride     sodium chloride     PRN Meds: sodium chloride, sodium chloride, acetaminophen, albuterol, hydrALAZINE, ondansetron (ZOFRAN) IV, ondansetron **OR** [DISCONTINUED] ondansetron (ZOFRAN) IV, sodium chloride flush, sodium chloride flush   Vital Signs    Vitals:   10/22/21 2340 10/23/21 0010 10/23/21 0040 10/23/21 0549  BP: 113/68 119/65 138/68 135/74  Pulse: (!) 58 (!) 56 (!) 55 (!) 56  Resp:   17 20  Temp:   97.9 F (36.6 C) 98 F (36.7 C)  TempSrc:   Oral Axillary  SpO2:  97% 99% 98%  Weight:    69.9 kg  Height:        Intake/Output Summary (Last 24 hours) at 10/23/2021 1043 Last data filed at 10/23/2021 0916 Gross per 24 hour  Intake 1461.16 ml  Output 2425 ml  Net -963.84 ml       10/23/2021    5:49 AM 10/22/2021    6:00 AM 10/22/2021   12:11 AM  Last 3 Weights  Weight (lbs) 154 lb 1.6 oz 150 lb 9.2 oz 152 lb 1.9 oz  Weight (kg) 69.9 kg 68.3 kg 69 kg      Telemetry    Sinus bradycardia, HR in 50's to 60's with PVC's.  - Personally Reviewed  ECG    No new tracings.   Physical Exam   GEN: in no acute distress.   Neck: +JVD Cardiac:  RRR, no murmurs, rubs, or gallops.  Respiratory: Clear to auscultation bilaterally without wheezing or rales. GI: Soft, nontender, non-distended  MS: Trace lower extremity edema; No deformity. Neuro:  Nonfocal  Psych: Normal affect   Labs    High Sensitivity Troponin:   Recent Labs  Lab 10/20/21 1132 10/20/21 1335 10/21/21 0626  TROPONINIHS 450* 418* 291*      Chemistry Recent Labs  Lab 10/20/21 1023 10/21/21 0626 10/22/21 0532 10/23/21 0334  NA 142 143 142 142  K 3.2* 2.8* 3.2* 3.0*  CL 111 109 107 105  CO2 '22 27 29 27  '$ GLUCOSE 130* 78 109* 127*  BUN 31* 28* 30* 26*  CREATININE 1.98* 2.06* 1.90* 1.78*  CALCIUM 8.6* 8.1* 8.2* 8.3*  MG  --  1.8 1.7 1.6*  PROT 6.8 5.5*  --  5.2*  ALBUMIN 3.5 2.8*  --  2.6*  AST 237* 127*  --  41  ALT 278* 245*  --  121*  ALKPHOS 150* 111  --  97  BILITOT 2.1* 1.9*  --  1.1  GFRNONAA 36* 35* 38* 41*  ANIONGAP '9 7 6 10     '$ Lipids No results for input(s): "CHOL", "TRIG", "HDL", "LABVLDL", "LDLCALC", "CHOLHDL" in the last 168 hours.  Hematology Recent Labs  Lab 10/21/21 0626 10/22/21 0532 10/23/21 0334  WBC 7.6 6.8 5.7  RBC 4.41 4.42 4.11*  HGB 11.2* 11.2* 10.4*  HCT 37.4* 37.2* 33.9*  MCV 84.8 84.2 82.5  MCH 25.4* 25.3* 25.3*  MCHC 29.9* 30.1 30.7  RDW 22.4* 22.2* 21.7*  PLT 216 204 186    Thyroid No results for input(s): "TSH", "FREET4" in the last 168 hours.  BNP Recent Labs  Lab 10/20/21 1132  BNP >4,500.0*     DDimer No results for input(s): "DDIMER" in the last 168 hours.   Radiology    CT Abdomen Pelvis Wo Contrast  Result Date: 10/20/2021 CLINICAL DATA:  Colorectal carcinoma. Elevated liver function. Metastatic disease evaluation. * Tracking Code: BO * EXAM: CT ABDOMEN AND PELVIS WITHOUT CONTRAST TECHNIQUE: Multidetector CT imaging of the abdomen and pelvis was performed following the standard protocol without IV contrast. RADIATION DOSE REDUCTION: This exam was performed according to the departmental  dose-optimization program which includes automated exposure control, adjustment of the mA and/or kV according to patient size and/or use of iterative reconstruction technique. COMPARISON:  CT 07/05/2021, MRI 08/06/2021 FINDINGS: Lower chest: Moderate RIGHT pleural effusion and small LEFT pleural effusion. Hepatobiliary: No suspicious lesion within liver with limitations of noncontrast exam. Simple cyst within the central liver unchanged from comparison CT. Large periphery calcified gallstone within the lumen the gallbladder. No acute inflammation. Pancreas: Coarse calcifications through the distal pancreas leading up to the head of the pancreas. Stable duct dilatation in tail the pancreas. No change from comparison MRI or CT. Spleen: Normal spleen Adrenals/urinary tract: Adrenal glands and kidneys are normal. The ureters and bladder normal. Stomach/Bowel: Stomach, duodenum small-bowel normal. Post RIGHT hemicolectomy. LEFT colon normal. Vascular/Lymphatic: Calcification abdominal aorta. Fusiform dilatation of the for abdominal aorta to 3.1 cm. Reproductive: TURP defect within the prostate Other: Midline surgical wound with mild thickening at the skin surface measuring 13 mm. Musculoskeletal: No aggressive osseous lesion. IMPRESSION: 1. New bilateral small moderate pleural effusions, RIGHT greater than LEFT. 2. No evidence of hepatic metastasis on noncontrast CT. 3. Large gallstone without evidence acute cholecystitis. 4. Post RIGHT hemicolectomy without complicating features. 5. Thickening tissue at the skin surface along the ventral abdominal wall is likely scar tissue. 6. 3.1 cm abdominal aortic aneurysm. Recommend follow-up every 3 years. Reference: J Am Coll Radiol 8127;51:700-174. Electronically Signed   By: Suzy Bouchard M.D.   On: 10/20/2021 14:17   DG Chest Port 1 View  Result Date: 10/20/2021 CLINICAL DATA:  Shortness of breath EXAM: PORTABLE CHEST 1 VIEW COMPARISON:  08/13/2021 FINDINGS: Stable  cardiomediastinal contours. Pulmonary vascular congestion with diffusely prominent interstitial markings bilaterally. No appreciable pleural fluid collection. No pneumothorax. IMPRESSION: Findings of CHF with interstitial edema. Electronically Signed   By: Davina Poke D.O.   On: 10/20/2021 11:02    Cardiac Studies   Echocardiogram: 10/21/2021 IMPRESSIONS     1. LV function is depressed with diffuse hypokinesis in the lateral and  inferiror walls Compared to echo from 2019, LVEF is worse. . Left  ventricular ejection fraction, by estimation, is 30%. The left ventricle  has moderately decreased function. The  left ventricular internal cavity size was severely dilated. There is mild  left ventricular hypertrophy. Left ventricular diastolic parameters are  consistent with Grade III diastolic dysfunction (restrictive). Elevated  left atrial pressure.  2. Right ventricular systolic function is low normal. The right  ventricular size is normal. There is normal pulmonary artery systolic  pressure.   3. Left atrial size was severely dilated.   4. Right atrial size was moderately dilated.   5. A small pericardial effusion is present. The pericardial effusion is  circumferential.   6. Mild mitral valve regurgitation.   7. The aortic valve is tricuspid. Aortic valve regurgitation is not  visualized. Aortic valve sclerosis is present, with no evidence of aortic  valve stenosis.   8. The inferior vena cava dilated.   Patient Profile     66 y.o. male w/PMH of HFmrEF (EF 45-50% by echo in 12/2017 with NST showing scar and mild ischemia), carotid artery stenosis (s/p right TCAR in 01/2021), iron deficiency anemia, HTN, HLD and Stage 3 CKD who is currently admitted for hypertensive urgency and an acute CHF exacerbation. Had recent removal of a colonic mass in 07/2021.   Assessment & Plan    CAD: Initial Hs Troponin was elevated to 450 with repeat values trending down to 418 and 291. Repeat  echo shows his EF has further declined to 30% and a cath was recommended for definitive evaluation.  Underwent RHC/LHC on 9/22 which showed 70% mid RCA stenosis, 80% acute marginal stenosis.   -Medical management recommended for coronary disease.   -Continue aspirin, Plavix -Continue Imdur -Will restart statin as transaminitis resolves   Acute combined heart failure: EF 45 to 50% by echo 12/2017.  Echo this admission showed EF down to 30%.  Cath with borderline obstructive disease but LV dysfunction function is out of proportion presented with hypertensive urgency, could be secondary to uncontrolled hypertension.  Consider cardiac MRI as outpatient.  RHC showed RA 16, wedge 21, LVEDP 21, cardiac index 3.3. -Diuretics were held for cath and he was given IV fluids.  RHC consistent with volume overload, restarted IV Lasix 80 mg BID after cath.  Diuresing well, would continue IV Lasix -Continue Coreg 25 mg twice daily -Hold off on ACE/ARB/Arni given renal function   Hypertensive Urgency: BP has improved. Remains on Amlodipine '5mg'$  daily, Coreg '25mg'$  BID and Imdur '30mg'$  daily.     Carotid Artery Stenosis: Underwent TCAR in 01/2021 and followed by Vascular as an outpatient. Remains on ASA and Plavix. Statin currently held given elevated LFT's.    Stage 3 CKD: Baseline creatinine 1.6 - 1.8. At 1.90 yesterday  Elevated LFT's: Improving as AST was initially 237 with ALT 278.  AST has normalized at 41, ALT remains elevated at 121.  Likely hepatic congestion due to CHF   HLD: On Repatha and Lipitor at home.  LDL 64.  Lipitor currently held given elevated LFT's but can hopefully resume prior to discharge or as an outpatient.  Hypokalemia/hypomagnesemia: Replete for K greater than 4, mag greater than 2     For questions or updates, please contact [place] Please consult [email or web address] for contact info under        Signed, Donato Heinz, MD  10/23/2021, 10:43 AM

## 2021-10-23 NOTE — Plan of Care (Signed)
  Problem: Clinical Measurements: Goal: Will remain free from infection Outcome: Progressing Goal: Diagnostic test results will improve Outcome: Progressing Goal: Respiratory complications will improve Outcome: Progressing Goal: Cardiovascular complication will be avoided Outcome: Progressing   Problem: Nutrition: Goal: Adequate nutrition will be maintained Outcome: Progressing   Problem: Coping: Goal: Level of anxiety will decrease Outcome: Progressing   Problem: Elimination: Goal: Will not experience complications related to urinary retention Outcome: Progressing   Problem: Pain Managment: Goal: General experience of comfort will improve Outcome: Progressing   Problem: Safety: Goal: Ability to remain free from injury will improve Outcome: Progressing   Problem: Skin Integrity: Goal: Risk for impaired skin integrity will decrease Outcome: Progressing

## 2021-10-23 NOTE — Progress Notes (Signed)
PROGRESS NOTE    Michael [name]  OFB:[id number] DOB: 1955-02-20 DOA: 10/20/2021 PCP: [name], MD   Brief Narrative:  66 y.o. male with medical history significant for carotid artery disease, COPD, CAD, CKD stage IIIb, hypertension, prior head and neck cancer, and chronic diastolic/systolic CHF who presented with complaints of dyspnea which has been intermittent over the past 2 weeks since his surgery.  Patient was admitted with hypertensive urgency and acute on chronic systolic/diastolic heart failure exacerbation with noted elevated troponin.  He was subsequently found to have an EF of 30% and cardiology recommended transfer to [facility] for cardiac catheterization.  Assessment & Plan:   Acute on chronic systolic/diastolic heart failure -Echo showed EF of 30% -Patient transferred from [facility] to [facility] for cardiac catheterization. -Status post cardiac catheterization on 10/22/2021 which showed mild to moderate nonobstructive disease: Recommended medical management -Currently on IV Lasix as per cardiology.  Continue Coreg, Imdur.  Strict and output, daily weights, fluid restriction  Elevated troponins -Possibly from above.  Acute delirium/acute metabolic encephalopathy -Improving.  Monitor mental status.  Hypertensive urgency: Resolved -Continue not being, Coreg, Lasix, Imdur next  Chronic kidney disease stage IIIb -Baseline creatinine 1.6-1.8.  Creatinine 1.78 today.  Monitor intermittently.  Acute transaminitis -Improving.  Monitor intermittently.  Anemia of chronic disease -Possibly from kidney disease.  Hemoglobin stable.  Monitor intermittently    Hypokalemia -Replace.  Repeat a.m. labs  Hypomagnesemia -Replace.  Repeat a.m. labs  History of COPD -Currently stable.  Continue nebs as needed  Right colonic mass removal in 07/2021 -High-grade dysplasia noted on pathology report with no invasive carcinoma  Carotid artery  disease -Prior right carotid severe stenosis with TCAR  Dyslipidemia -Currently on Repatha with LDL 64   Prior head neck cancer with resection -Outpatient follow-up with ENT   Stage I pressure injury in the perineal region: Present on admission -Agree with the documentation below.  Continue local wound care Pressure Injury 10/20/21 Perineum Mid Stage 1 -  Intact skin with non-blanchable redness of a localized area usually over a bony prominence. Deeply reddened.  Non-blanchable and raw throughout perineum (Active)  10/20/21 1612  Location: Perineum  Location Orientation: Mid  Staging: Stage 1 -  Intact skin with non-blanchable redness of a localized area usually over a bony prominence.  Wound Description (Comments): Deeply reddened.  Non-blanchable and raw throughout perineum  Present on Admission: Yes      DVT prophylaxis: Heparin Code Status: Full Family Communication: None at bedside Disposition Plan: Status is: Inpatient Remains inpatient appropriate because: Of severity of illness.  Need for IV diuresis.    Consultants: Cardiology  Procedures: Echo/cardiac catheterization  Antimicrobials: None   Subjective: Patient seen and examined at bedside.  Poor historian.  Feels slightly better, still slightly short of breath with exertion.  No overnight fever, agitation reported.  Objective: Vitals:   10/22/21 2340 10/23/21 0010 10/23/21 0040 10/23/21 0549  BP: 113/68 119/65 138/68 135/74  Pulse: (!) 58 (!) 56 (!) 55 (!) 56  Resp:   17 20  Temp:   97.9 F (36.6 C) 98 F (36.7 C)  TempSrc:   Oral Axillary  SpO2:  97% 99% 98%  Weight:    69.9 kg  Height:        Intake/Output Summary (Last 24 hours) at 10/23/2021 0949 Last data filed at 10/23/2021 0106 Gross per 24 hour  Intake 981.16 ml  Output 2425 ml  Net -1443.84 ml   [employer]  10/22/21 0011 10/22/21 0600 10/23/21 0549  Weight: 69 kg 68.3 kg 69.9 kg    Examination:  General exam: Appears calm and  comfortable looks chronically ill and deconditioned.  Currently on room air. Respiratory system: Bilateral decreased breath sounds at bases with basilar crackles Cardiovascular system: S1 & S2 heard, intermittently bradycardic Gastrointestinal system: Abdomen is nondistended, soft and nontender. Normal bowel sounds heard. Extremities: No cyanosis, clubbing or lower extremity edema present bilaterally Central nervous system: Alert and oriented. No focal neurological deficits. Moving extremities.  Slow to respond.  Poor historian. Skin: No rashes, lesions or ulcers Psychiatry: Flat affect.  No signs of agitation.   Data Reviewed: I have personally reviewed following labs and imaging studies  CBC: Recent Labs  Lab 10/20/21 1023 10/21/21 0626 10/22/21 0532 10/23/21 0334  WBC 8.1 7.6 6.8 5.7  NEUTROABS 6.7  --   --   --   HGB 13.1 11.2* 11.2* 10.4*  HCT 44.4 37.4* 37.2* 33.9*  MCV 84.3 84.8 84.2 82.5  PLT 294 216 204 643   Basic Metabolic Panel: Recent Labs  Lab 10/20/21 1023 10/21/21 0626 10/22/21 0532 10/23/21 0334  NA 142 143 142 142  K 3.2* 2.8* 3.2* 3.0*  CL 111 109 107 105  CO2 '22 27 29 27  '$ GLUCOSE 130* 78 109* 127*  BUN 31* 28* 30* 26*  CREATININE 1.98* 2.06* 1.90* 1.78*  CALCIUM 8.6* 8.1* 8.2* 8.3*  MG  --  1.8 1.7 1.6*   GFR: Estimated Creatinine Clearance: 39.8 mL/min (A) (by C-G formula based on SCr of 1.78 mg/dL (H)). Liver Function Tests: Recent Labs  Lab 10/20/21 1023 10/21/21 0626 10/23/21 0334  AST 237* 127* 41  ALT 278* 245* 121*  ALKPHOS 150* 111 97  BILITOT 2.1* 1.9* 1.1  PROT 6.8 5.5* 5.2*  ALBUMIN 3.5 2.8* 2.6*   No results for input(s): "LIPASE", "AMYLASE" in the last 168 hours. No results for input(s): "AMMONIA" in the last 168 hours. Coagulation Profile: No results for input(s): "INR", "PROTIME" in the last 168 hours. Cardiac Enzymes: No results for input(s): "CKTOTAL", "CKMB", "CKMBINDEX", "TROPONINI" in the last 168 hours. BNP (last  3 results) No results for input(s): "PROBNP" in the last 8760 hours. HbA1C: No results for input(s): "HGBA1C" in the last 72 hours. CBG: Recent Labs  Lab 10/22/21 1625  GLUCAP 106*   Lipid Profile: No results for input(s): "CHOL", "HDL", "LDLCALC", "TRIG", "CHOLHDL", "LDLDIRECT" in the last 72 hours. Thyroid Function Tests: No results for input(s): "TSH", "T4TOTAL", "FREET4", "T3FREE", "THYROIDAB" in the last 72 hours. Anemia Panel: No results for input(s): "VITAMINB12", "FOLATE", "FERRITIN", "TIBC", "IRON", "RETICCTPCT" in the last 72 hours. Sepsis Labs: No results for input(s): "PROCALCITON", "LATICACIDVEN" in the last 168 hours.  Recent Results (from the past 240 hour(s))  MRSA Next Gen by PCR, Nasal     Status: None   Collection Time: 10/20/21  4:35 PM   Specimen: Nasal Mucosa; Nasal Swab  Result Value Ref Range Status   MRSA by PCR Next Gen NOT DETECTED NOT DETECTED Final    Comment: (NOTE) The GeneXpert MRSA Assay (FDA approved for NASAL specimens only), is one component of a comprehensive MRSA colonization surveillance program. It is not intended to diagnose MRSA infection nor to guide or monitor treatment for MRSA infections. Test performance is not FDA approved in patients less than 55 years old. Performed at [facility], [address] [place], [place] [address]   SARS Coronavirus 2 by RT PCR (hospital order, performed in [facility]  Health hospital lab) *cepheid single result test* Anterior Nasal Swab     Status: None   Collection Time: 10/21/21  9:40 AM   Specimen: Anterior Nasal Swab  Result Value Ref Range Status   SARS Coronavirus 2 by RT PCR NEGATIVE NEGATIVE Final    Comment: (NOTE) SARS-CoV-2 target nucleic acids are NOT DETECTED.  The SARS-CoV-2 RNA is generally detectable in upper and lower respiratory specimens during the acute phase of infection. The lowest concentration of SARS-CoV-2 viral copies this assay can detect is 250 copies / mL. A negative result  does not preclude SARS-CoV-2 infection and should not be used as the sole basis for treatment or other patient management decisions.  A negative result may occur with improper specimen collection / handling, submission of specimen other than nasopharyngeal swab, presence of viral mutation(s) within the areas targeted by this assay, and inadequate number of viral copies (<250 copies / mL). A negative result must be combined with clinical observations, patient history, and epidemiological information.  Fact Sheet for Patients:   https://www.patel.info/  Fact Sheet for Healthcare Providers: https://hall.com/  This test is not yet approved or  cleared by the [place] FDA and has been authorized for detection and/or diagnosis of SARS-CoV-2 by FDA under an Emergency Use Authorization (EUA).  This EUA will remain in effect (meaning this test can be used) for the duration of the COVID-19 declaration under Section 564(b)(1) of the Act, 21 U.S.C. section 360bbb-3(b)(1), unless the authorization is terminated or revoked sooner.  Performed at [facility], [address] [place], [place] [address]          Radiology Studies: CARDIAC CATHETERIZATION  Result Date: 10/22/2021   Mid RCA lesion is 70% stenosed.   Acute Mrg lesion is 80% stenosed. 1.  Mild to moderate obstructive coronary artery disease that is outweighed by the patient's degree of LV dysfunction.  There is a significant bifurcation lesion of the right coronary artery and large RV marginal branch.  This is moderately calcified and relatively tortuous.  Given the patient's lack of chest pain, medical therapy should be pursued. 2.  Cardiac output of 6.3 L/min, cardiac index of 3.3 L/min/m, mean RA pressure of 16 mmHg, mean wedge pressure of 21 mmHg, and LVEDP of 21 mmHg. Recommendation: Goal-directed medical therapy with augmentation of diuretics; consider dual antiplatelet therapy for 1  year for medical treatment of acute coronary syndrome.   ECHOCARDIOGRAM COMPLETE  Result Date: 10/21/2021    ECHOCARDIOGRAM REPORT   Patient Name:   ARJEN DERINGER Date of Exam: 10/21/2021 Medical Rec #:  [id number]     Height:       72.0 in Accession #:    [id number]    Weight:       167.0 lb Date of Birth:  27-Mar-1955     BSA:          1.973 m Patient Age:    48 years      BP:           136/54 mmHg Patient Gender: M             HR:           62 bpm. Exam Location:  [name] Procedure: 2D Echo, Cardiac Doppler and Color Doppler Indications:    Dyspnea  History:        Patient has prior history of Echocardiogram examinations, most                 recent  12/05/2017. CHF, CAD, Abnormal ECG, COPD,                 Signs/Symptoms:Murmur and Syncope; Risk Factors:Hypertension,                 Diabetes and Current Smoker.  Sonographer:    Wenda Low Referring Phys: [id number] [facility] IMPRESSIONS  1. LV function is depressed with diffuse hypokinesis in the lateral and inferiror walls Compared to echo from 2019, LVEF is worse. . Left ventricular ejection fraction, by estimation, is 30%. The left ventricle has moderately decreased function. The left ventricular internal cavity size was severely dilated. There is mild left ventricular hypertrophy. Left ventricular diastolic parameters are consistent with Grade III diastolic dysfunction (restrictive). Elevated left atrial pressure.  2. Right ventricular systolic function is low normal. The right ventricular size is normal. There is normal pulmonary artery systolic pressure.  3. Left atrial size was severely dilated.  4. Right atrial size was moderately dilated.  5. A small pericardial effusion is present. The pericardial effusion is circumferential.  6. Mild mitral valve regurgitation.  7. The aortic valve is tricuspid. Aortic valve regurgitation is not visualized. Aortic valve sclerosis is present, with no evidence of aortic valve stenosis.  8. The inferior vena  cava dilated. FINDINGS  Left Ventricle: LV function is depressed with diffuse hypokinesis in the lateral and inferiror walls Compared to echo from 2019, LVEF is worse. Left ventricular ejection fraction, by estimation, is 30%. The left ventricle has moderately decreased function. The left ventricular internal cavity size was severely dilated. There is mild left ventricular hypertrophy. Left ventricular diastolic parameters are consistent with Grade III diastolic dysfunction (restrictive). Elevated left atrial pressure. Right Ventricle: The right ventricular size is normal. Right vetricular wall thickness was not assessed. Right ventricular systolic function is low normal. There is normal pulmonary artery systolic pressure. The tricuspid regurgitant velocity is 1.60 m/s, and with an assumed right atrial pressure of 8 mmHg, the estimated right ventricular systolic pressure is 62.3 mmHg. Left Atrium: Left atrial size was severely dilated. Right Atrium: Right atrial size was moderately dilated. Pericardium: A small pericardial effusion is present. The pericardial effusion is circumferential. Mitral Valve: There is mild thickening of the mitral valve leaflet(s). Mild mitral annular calcification. Mild mitral valve regurgitation. MV peak gradient, 6.9 mmHg. The mean mitral valve gradient is 2.0 mmHg. Tricuspid Valve: The tricuspid valve is normal in structure. Tricuspid valve regurgitation is mild. Aortic Valve: The aortic valve is tricuspid. Aortic valve regurgitation is not visualized. Aortic valve sclerosis is present, with no evidence of aortic valve stenosis. Aortic valve mean gradient measures 3.0 mmHg. Aortic valve peak gradient measures 7.2  mmHg. Aortic valve area, by VTI measures 2.65 cm. Pulmonic Valve: The pulmonic valve was normal in structure. Pulmonic valve regurgitation is not visualized. Aorta: The aortic root and ascending aorta are structurally normal, with no evidence of dilitation. Venous: The  inferior vena cava dilated. IAS/Shunts: No atrial level shunt detected by color flow Doppler.  LEFT VENTRICLE PLAX 2D LVIDd:         6.50 cm      Diastology LVIDs:         5.60 cm      LV e' medial:   3.09 cm/s LV PW:         1.10 cm      LV E/e' medial: 37.2 LV IVS:        1.20 cm LVOT diam:     2.40  cm LV SV:         72 LV SV Index:   37 LVOT Area:     4.52 cm  LV Volumes (MOD) LV vol d, MOD A2C: 182.0 ml LV vol d, MOD A4C: 179.0 ml LV vol s, MOD A2C: 132.0 ml LV vol s, MOD A4C: 125.0 ml LV SV MOD A2C:     50.0 ml LV SV MOD A4C:     179.0 ml LV SV MOD BP:      49.6 ml RIGHT VENTRICLE RV Basal diam:  5.50 cm RV Mid diam:    3.70 cm RV S prime:     7.62 cm/s TAPSE (M-mode): 2.2 cm LEFT ATRIUM              Index        RIGHT ATRIUM           Index LA diam:        5.40 cm  2.74 cm/m   RA Area:     26.00 cm LA Vol (A2C):   144.0 ml 72.97 ml/m  RA Volume:   94.10 ml  47.68 ml/m LA Vol (A4C):   132.0 ml 66.89 ml/m LA Biplane Vol: 143.0 ml 72.46 ml/m  AORTIC VALVE                    PULMONIC VALVE AV Area (Vmax):    3.41 cm     PV Vmax:       0.81 m/s AV Area (Vmean):   2.91 cm     PV Peak grad:  2.7 mmHg AV Area (VTI):     2.65 cm AV Vmax:           134.00 cm/s AV Vmean:          82.700 cm/s AV VTI:            0.273 m AV Peak Grad:      7.2 mmHg AV Mean Grad:      3.0 mmHg LVOT Vmax:         101.00 cm/s LVOT Vmean:        53.200 cm/s LVOT VTI:          0.160 m LVOT/AV VTI ratio: 0.59  AORTA Ao Root diam: 3.80 cm Ao Asc diam:  3.40 cm MITRAL VALVE                TRICUSPID VALVE MV Area (PHT): 3.58 cm     TR Peak grad:   10.2 mmHg MV Area VTI:   1.86 cm     TR Vmax:        160.00 cm/s MV Peak grad:  6.9 mmHg MV Mean grad:  2.0 mmHg     SHUNTS MV Vmax:       1.31 m/s     Systemic VTI:  0.16 m MV Vmean:      55.6 cm/s    Systemic Diam: 2.40 cm MV Decel Time: 212 msec MV E velocity: 115.00 cm/s MV A velocity: 36.80 cm/s MV E/A ratio:  3.13 Dorris Carnes MD Electronically signed by Dorris Carnes MD Signature Date/Time:  10/21/2021/2:57:36 PM    Final         Scheduled Meds:  amLODipine  5 mg Oral Daily   aspirin EC  81 mg Oral Daily   carvedilol  25 mg Oral BID WC   clopidogrel  75 mg Oral Daily   furosemide  80 mg Intravenous BID  heparin  5,000 Units Subcutaneous Q8H   isosorbide mononitrate  30 mg Oral QPM   potassium chloride  40 mEq Oral Q4H   sodium chloride flush  3 mL Intravenous Q12H   sodium chloride flush  3 mL Intravenous Q12H   sodium chloride flush  3 mL Intravenous Q12H   Continuous Infusions:  sodium chloride     sodium chloride            Aline August, MD [facility] 10/23/2021, 9:49 AM

## 2021-10-24 DIAGNOSIS — I5043 Acute on chronic combined systolic (congestive) and diastolic (congestive) heart failure: Secondary | ICD-10-CM

## 2021-10-24 DIAGNOSIS — I160 Hypertensive urgency: Secondary | ICD-10-CM | POA: Diagnosis not present

## 2021-10-24 DIAGNOSIS — R778 Other specified abnormalities of plasma proteins: Secondary | ICD-10-CM | POA: Diagnosis not present

## 2021-10-24 DIAGNOSIS — R7401 Elevation of levels of liver transaminase levels: Secondary | ICD-10-CM

## 2021-10-24 DIAGNOSIS — I6523 Occlusion and stenosis of bilateral carotid arteries: Secondary | ICD-10-CM | POA: Diagnosis not present

## 2021-10-24 DIAGNOSIS — N1831 Chronic kidney disease, stage 3a: Secondary | ICD-10-CM

## 2021-10-24 DIAGNOSIS — K6389 Other specified diseases of intestine: Secondary | ICD-10-CM | POA: Diagnosis not present

## 2021-10-24 LAB — CBC WITH DIFFERENTIAL/PLATELET
Abs Immature Granulocytes: 0.02 10*3/uL (ref 0.00–0.07)
Basophils Absolute: 0.1 10*3/uL (ref 0.0–0.1)
Basophils Relative: 1 %
Eosinophils Absolute: 0.1 10*3/uL (ref 0.0–0.5)
Eosinophils Relative: 3 %
HCT: 33.7 % — ABNORMAL LOW (ref 39.0–52.0)
Hemoglobin: 10.7 g/dL — ABNORMAL LOW (ref 13.0–17.0)
Immature Granulocytes: 0 %
Lymphocytes Relative: 24 %
Lymphs Abs: 1.3 10*3/uL (ref 0.7–4.0)
MCH: 25.8 pg — ABNORMAL LOW (ref 26.0–34.0)
MCHC: 31.8 g/dL (ref 30.0–36.0)
MCV: 81.4 fL (ref 80.0–100.0)
Monocytes Absolute: 0.8 10*3/uL (ref 0.1–1.0)
Monocytes Relative: 15 %
Neutro Abs: 3.1 10*3/uL (ref 1.7–7.7)
Neutrophils Relative %: 57 %
Platelets: 187 10*3/uL (ref 150–400)
RBC: 4.14 MIL/uL — ABNORMAL LOW (ref 4.22–5.81)
RDW: 21.2 % — ABNORMAL HIGH (ref 11.5–15.5)
Smear Review: NORMAL
WBC: 5.5 10*3/uL (ref 4.0–10.5)
nRBC: 0 % (ref 0.0–0.2)

## 2021-10-24 LAB — COMPREHENSIVE METABOLIC PANEL
ALT: 91 U/L — ABNORMAL HIGH (ref 0–44)
AST: 34 U/L (ref 15–41)
Albumin: 2.7 g/dL — ABNORMAL LOW (ref 3.5–5.0)
Alkaline Phosphatase: 99 U/L (ref 38–126)
Anion gap: 7 (ref 5–15)
BUN: 28 mg/dL — ABNORMAL HIGH (ref 8–23)
CO2: 30 mmol/L (ref 22–32)
Calcium: 8.1 mg/dL — ABNORMAL LOW (ref 8.9–10.3)
Chloride: 102 mmol/L (ref 98–111)
Creatinine, Ser: 1.97 mg/dL — ABNORMAL HIGH (ref 0.61–1.24)
GFR, Estimated: 37 mL/min — ABNORMAL LOW (ref >60)
Glucose, Bld: 113 mg/dL — ABNORMAL HIGH (ref 70–99)
Potassium: 3.3 mmol/L — ABNORMAL LOW (ref 3.5–5.1)
Sodium: 139 mmol/L (ref 135–145)
Total Bilirubin: 1 mg/dL (ref 0.3–1.2)
Total Protein: 5.5 g/dL — ABNORMAL LOW (ref 6.5–8.1)

## 2021-10-24 LAB — MAGNESIUM: Magnesium: 1.9 mg/dL (ref 1.7–2.4)

## 2021-10-24 MED ORDER — POTASSIUM CHLORIDE 20 MEQ PO PACK
40.0000 meq | PACK | Freq: Once | ORAL | Status: AC
  Administered 2021-10-24: 40 meq via ORAL
  Filled 2021-10-24: qty 2

## 2021-10-24 MED ORDER — POTASSIUM CHLORIDE CRYS ER 20 MEQ PO TBCR
40.0000 meq | EXTENDED_RELEASE_TABLET | ORAL | Status: DC
  Administered 2021-10-24: 40 meq via ORAL
  Filled 2021-10-24: qty 2

## 2021-10-24 MED ORDER — CARVEDILOL 12.5 MG PO TABS
12.5000 mg | ORAL_TABLET | Freq: Two times a day (BID) | ORAL | Status: DC
  Administered 2021-10-24 – 2021-10-26 (×4): 12.5 mg via ORAL
  Filled 2021-10-24 (×4): qty 1

## 2021-10-24 MED ORDER — FUROSEMIDE 40 MG PO TABS
40.0000 mg | ORAL_TABLET | Freq: Every day | ORAL | Status: DC
  Filled 2021-10-24: qty 1

## 2021-10-24 NOTE — Progress Notes (Signed)
Rounding Note    Patient Name: Michael [name] Date of Encounter: 10/24/2021  [place] Cardiologist: Rozann Lesches, MD   Subjective   Net -3 L yesterday, -8.4 L on admission.  Renal function overall stable (2.06 > 1.90 > 1.78>1.97).  Potassium 3.3, magnesium 1.9.  BP 118/54.  Reports dyspnea resolved  Inpatient Medications    Scheduled Meds:  amLODipine  5 mg Oral Daily   aspirin EC  81 mg Oral Daily   carvedilol  25 mg Oral BID WC   clopidogrel  75 mg Oral Daily   furosemide  80 mg Intravenous BID   heparin  5,000 Units Subcutaneous Q8H   isosorbide mononitrate  30 mg Oral QPM   potassium chloride  40 mEq Oral Once   sodium chloride flush  3 mL Intravenous Q12H   sodium chloride flush  3 mL Intravenous Q12H   Continuous Infusions:  sodium chloride     sodium chloride     PRN Meds: sodium chloride, sodium chloride, acetaminophen, albuterol, hydrALAZINE, ondansetron (ZOFRAN) IV, ondansetron **OR** [DISCONTINUED] ondansetron (ZOFRAN) IV, sodium chloride flush   Vital Signs    Vitals:   10/23/21 1845 10/24/21 0519 10/24/21 0924 10/24/21 0931  BP: (!) 110/57 124/73 (!) 118/54   Pulse: 60 (!) 51 67 67  Resp: (!) 24 (!) 22 (!) 22   Temp: 97.7 F (36.5 C) 98.3 F (36.8 C) 98 F (36.7 C)   TempSrc: Oral Oral Oral   SpO2: 100% 93% 97%   Weight:  65.4 kg    Height:        Intake/Output Summary (Last 24 hours) at 10/24/2021 1123 Last data filed at 10/24/2021 0519 Gross per 24 hour  Intake 529.24 ml  Output 3976 ml  Net -3446.76 ml       10/24/2021    5:19 AM 10/23/2021    5:49 AM 10/22/2021    6:00 AM  Last 3 Weights  Weight (lbs) 144 lb 1.6 oz 154 lb 1.6 oz 150 lb 9.2 oz  Weight (kg) 65.363 kg 69.9 kg 68.3 kg      Telemetry    Sinus bradycardia, HR in 50's to 60's with PVC's.  - Personally Reviewed  ECG    No new tracings.   Physical Exam   GEN: in no acute distress.   Neck: No JVD Cardiac: RRR, no murmurs, rubs, or gallops.   Respiratory: Clear to auscultation bilaterally without wheezing or rales. GI: Soft, nontender, non-distended  MS: No lower extremity edema; No deformity. Neuro:  Nonfocal  Psych: Normal affect   Labs    High Sensitivity Troponin:   Recent Labs  Lab 10/20/21 1132 10/20/21 1335 10/21/21 0626  TROPONINIHS 450* 418* 291*      Chemistry Recent Labs  Lab 10/21/21 0626 10/22/21 0532 10/23/21 0334 10/24/21 0439  NA 143 142 142 139  K 2.8* 3.2* 3.0* 3.3*  CL 109 107 105 102  CO2 '27 29 27 30  '$ GLUCOSE 78 109* 127* 113*  BUN 28* 30* 26* 28*  CREATININE 2.06* 1.90* 1.78* 1.97*  CALCIUM 8.1* 8.2* 8.3* 8.1*  MG 1.8 1.7 1.6* 1.9  PROT 5.5*  --  5.2* 5.5*  ALBUMIN 2.8*  --  2.6* 2.7*  AST 127*  --  41 34  ALT 245*  --  121* 91*  ALKPHOS 111  --  97 99  BILITOT 1.9*  --  1.1 1.0  GFRNONAA 35* 38* 41* 37*  ANIONGAP '7 6 10 '$ 7  Lipids No results for input(s): "CHOL", "TRIG", "HDL", "LABVLDL", "LDLCALC", "CHOLHDL" in the last 168 hours.  Hematology Recent Labs  Lab 10/22/21 0532 10/23/21 0334 10/24/21 0439  WBC 6.8 5.7 5.5  RBC 4.42 4.11* 4.14*  HGB 11.2* 10.4* 10.7*  HCT 37.2* 33.9* 33.7*  MCV 84.2 82.5 81.4  MCH 25.3* 25.3* 25.8*  MCHC 30.1 30.7 31.8  RDW 22.2* 21.7* 21.2*  PLT 204 186 187    Thyroid No results for input(s): "TSH", "FREET4" in the last 168 hours.  BNP Recent Labs  Lab 10/20/21 1132  BNP >4,500.0*     DDimer No results for input(s): "DDIMER" in the last 168 hours.   Radiology    CT Abdomen Pelvis Wo Contrast  Result Date: 10/20/2021 CLINICAL DATA:  Colorectal carcinoma. Elevated liver function. Metastatic disease evaluation. * Tracking Code: BO * EXAM: CT ABDOMEN AND PELVIS WITHOUT CONTRAST TECHNIQUE: Multidetector CT imaging of the abdomen and pelvis was performed following the standard protocol without IV contrast. RADIATION DOSE REDUCTION: This exam was performed according to the departmental dose-optimization program which includes  automated exposure control, adjustment of the mA and/or kV according to patient size and/or use of iterative reconstruction technique. COMPARISON:  CT 07/05/2021, MRI 08/06/2021 FINDINGS: Lower chest: Moderate RIGHT pleural effusion and small LEFT pleural effusion. Hepatobiliary: No suspicious lesion within liver with limitations of noncontrast exam. Simple cyst within the central liver unchanged from comparison CT. Large periphery calcified gallstone within the lumen the gallbladder. No acute inflammation. Pancreas: Coarse calcifications through the distal pancreas leading up to the head of the pancreas. Stable duct dilatation in tail the pancreas. No change from comparison MRI or CT. Spleen: Normal spleen Adrenals/urinary tract: Adrenal glands and kidneys are normal. The ureters and bladder normal. Stomach/Bowel: Stomach, duodenum small-bowel normal. Post RIGHT hemicolectomy. LEFT colon normal. Vascular/Lymphatic: Calcification abdominal aorta. Fusiform dilatation of the for abdominal aorta to 3.1 cm. Reproductive: TURP defect within the prostate Other: Midline surgical wound with mild thickening at the skin surface measuring 13 mm. Musculoskeletal: No aggressive osseous lesion. IMPRESSION: 1. New bilateral small moderate pleural effusions, RIGHT greater than LEFT. 2. No evidence of hepatic metastasis on noncontrast CT. 3. Large gallstone without evidence acute cholecystitis. 4. Post RIGHT hemicolectomy without complicating features. 5. Thickening tissue at the skin surface along the ventral abdominal wall is likely scar tissue. 6. 3.1 cm abdominal aortic aneurysm. Recommend follow-up every 3 years. Reference: J Am Coll Radiol 9371;69:[id number]. Electronically Signed   By: Suzy Bouchard M.D.   On: 10/20/2021 14:17   DG Chest Port 1 View  Result Date: 10/20/2021 CLINICAL DATA:  Shortness of breath EXAM: PORTABLE CHEST 1 VIEW COMPARISON:  08/13/2021 FINDINGS: Stable cardiomediastinal contours. Pulmonary  vascular congestion with diffusely prominent interstitial markings bilaterally. No appreciable pleural fluid collection. No pneumothorax. IMPRESSION: Findings of CHF with interstitial edema. Electronically Signed   By: Davina Poke D.O.   On: 10/20/2021 11:02    Cardiac Studies   Echocardiogram: 10/21/2021 IMPRESSIONS     1. LV function is depressed with diffuse hypokinesis in the lateral and  inferiror walls Compared to echo from 2019, LVEF is worse. . Left  ventricular ejection fraction, by estimation, is 30%. The left ventricle  has moderately decreased function. The  left ventricular internal cavity size was severely dilated. There is mild  left ventricular hypertrophy. Left ventricular diastolic parameters are  consistent with Grade III diastolic dysfunction (restrictive). Elevated  left atrial pressure.   2. Right ventricular systolic function is low normal.  The right  ventricular size is normal. There is normal pulmonary artery systolic  pressure.   3. Left atrial size was severely dilated.   4. Right atrial size was moderately dilated.   5. A small pericardial effusion is present. The pericardial effusion is  circumferential.   6. Mild mitral valve regurgitation.   7. The aortic valve is tricuspid. Aortic valve regurgitation is not  visualized. Aortic valve sclerosis is present, with no evidence of aortic  valve stenosis.   8. The inferior vena cava dilated.   Patient Profile     66 y.o. male w/PMH of HFmrEF (EF 45-50% by echo in 12/2017 with NST showing scar and mild ischemia), carotid artery stenosis (s/p right TCAR in 01/2021), iron deficiency anemia, HTN, HLD and Stage 3 CKD who is currently admitted for hypertensive urgency and an acute CHF exacerbation. Had recent removal of a colonic mass in 07/2021.   Assessment & Plan    CAD: Initial Hs Troponin was elevated to 450 with repeat values trending down to 418 and 291. Repeat echo shows his EF has further declined  to 30% and a cath was recommended for definitive evaluation.  Underwent RHC/LHC on 9/22 which showed 70% mid RCA stenosis, 80% acute marginal stenosis.   -Medical management recommended for coronary disease.   -Continue aspirin, Plavix -Continue Imdur -Will restart statin as transaminitis resolves   Acute combined heart failure: EF 45 to 50% by echo 12/2017.  Echo this admission showed EF down to 30%.  Cath with borderline obstructive disease but LV dysfunction function is out of proportion presented with hypertensive urgency, could be secondary to uncontrolled hypertension.  Consider cardiac MRI as outpatient.  RHC showed RA 16, wedge 21, LVEDP 21, cardiac index 3.3. -Diuretics were held for cath and he was given IV fluids.  RHC consistent with volume overload, restarted IV Lasix 80 mg BID after cath.  He has diuresed well, appears euvolemic.  Received IV Lasix this morning, will transition to p.o. Lasix tomorrow -Continue Coreg, will reduce dose to 12.5 mg twice daily given bradycardia to 40s -Hold off on ACE/ARB/Arni given renal function   Hypertensive Urgency: BP has improved. Remains on Amlodipine '5mg'$  daily, Coreg '25mg'$  BID and Imdur '30mg'$  daily.  Reduce Coreg to 12.5 mg twice daily given bradycardia   Carotid Artery Stenosis: Underwent TCAR in 01/2021 and followed by Vascular as an outpatient. Remains on ASA and Plavix. Statin currently held given elevated LFT's.    Stage 3 CKD: Baseline creatinine 1.6 - 1.8. At 1.97 today  Elevated LFT's: Improving.  Likely hepatic congestion due to CHF   HLD: On Repatha and Lipitor at home.  LDL 64.  Lipitor currently held given elevated LFT's but can hopefully resume prior to discharge or as an outpatient.  Hypokalemia/hypomagnesemia: Replete for K greater than 4, mag greater than 2     For questions or updates, please contact [place] Please consult [email or web address] for contact info under        Signed, Donato Heinz, MD   10/24/2021, 11:23 AM

## 2021-10-24 NOTE — Progress Notes (Signed)
PROGRESS NOTE    Michael [name]  QQP:[id number] DOB: May 14, 1955 DOA: 10/20/2021 PCP: [name], MD   Brief Narrative:  66 y.o. male with medical history significant for carotid artery disease, COPD, CAD, CKD stage IIIb, hypertension, prior head and neck cancer, and chronic diastolic/systolic CHF who presented with complaints of dyspnea which has been intermittent over the past 2 weeks since his surgery.  Patient was admitted with hypertensive urgency and acute on chronic systolic/diastolic heart failure exacerbation with noted elevated troponin.  He was subsequently found to have an EF of 30% and cardiology recommended transfer to [facility] for cardiac catheterization.  Cardiac catheterization showed mild to moderate nonobstructive disease.  Assessment & Plan:   Acute on chronic systolic/diastolic heart failure -Echo showed EF of 30% -Patient transferred from [facility] to [facility] for cardiac catheterization. -Status post cardiac catheterization on 10/22/2021 which showed mild to moderate nonobstructive disease: Recommended medical management -Currently on IV Lasix as per cardiology.  Continue Coreg, Imdur.  Strict and output, daily weights, fluid restriction  Elevated troponins -Possibly from above.  Continue aspirin, Plavix, Coreg.  Acute delirium/acute metabolic encephalopathy -Improving.  Monitor mental status.  Hypertensive urgency: Resolved -Continue Coreg, Lasix, Imdur   chronic kidney disease stage IIIb -Baseline creatinine 1.6-1.8.  Creatinine slightly increased to 1.97 today.  Monitor intermittently.  Acute transaminitis -Improving.  Monitor intermittently.  Anemia of chronic disease -Possibly from kidney disease.  Hemoglobin stable.  Monitor intermittently  Hypokalemia -Replace.  Repeat a.m. labs  Hypomagnesemia -Improved  History of COPD -Currently stable.  Continue nebs as needed  Right colonic mass removal in  07/2021 -High-grade dysplasia noted on pathology report with no invasive carcinoma  Carotid artery disease -Prior right carotid severe stenosis with TCAR  Dyslipidemia -Currently on Repatha with LDL 64   Prior head neck cancer with resection -Outpatient follow-up with ENT   Stage I pressure injury in the perineal region: Present on admission -Agree with the documentation below.  Continue local wound care Pressure Injury 10/20/21 Perineum Mid Stage 1 -  Intact skin with non-blanchable redness of a localized area usually over a bony prominence. Deeply reddened.  Non-blanchable and raw throughout perineum (Active)  10/20/21 1612  Location: Perineum  Location Orientation: Mid  Staging: Stage 1 -  Intact skin with non-blanchable redness of a localized area usually over a bony prominence.  Wound Description (Comments): Deeply reddened.  Non-blanchable and raw throughout perineum  Present on Admission: Yes      DVT prophylaxis: Heparin Code Status: Full Family Communication: None at bedside Disposition Plan: Status is: Inpatient Remains inpatient appropriate because: Of severity of illness.  Need for IV diuresis.    Consultants: Cardiology  Procedures: Echo/cardiac catheterization  Antimicrobials: None   Subjective: Patient seen and examined at bedside.  Poor historian.  Denies any chest pain, fever, vomiting or worsening shortness of breath. Objective: Vitals:   10/23/21 1500 10/23/21 1510 10/23/21 1845 10/24/21 0519  BP:  (!) 112/59 (!) 110/57 124/73  Pulse:  (!) 59 60 (!) 51  Resp:  16 (!) 24 (!) 22  Temp:   97.7 F (36.5 C) 98.3 F (36.8 C)  TempSrc: Oral  Oral Oral  SpO2:  100% 100% 93%  Weight:    65.4 kg  Height:        Intake/Output Summary (Last 24 hours) at 10/24/2021 0817 Last data filed at 10/24/2021 0519 Gross per 24 hour  Intake 1009.24 ml  Output 3976 ml  Net -2966.76  ml    Filed Weights   10/22/21 0600 10/23/21 0549 10/24/21 0519  Weight: 68.3  kg 69.9 kg 65.4 kg    Examination:  General: On room air.  No distress.  Looks chronically ill and deconditioned. ENT/neck: No thyromegaly.  JVD is not elevated  respiratory: Decreased breath sounds at bases bilaterally with some crackles; no wheezing, intermittently tachypneic  CVS: S1-S2 heard, rate controlled Abdominal: Soft, nontender, slightly distended; no organomegaly, normal bowel sounds are heard Extremities: Trace lower extremity edema; no cyanosis  CNS: Awake and alert.  Slow to respond.  Poor historian.  No focal neurologic deficit.  Moves extremities Lymph: No obvious lymphadenopathy Skin: No obvious ecchymosis/lesions  psych: Not agitated.  Mostly flat affect.   Musculoskeletal: No obvious joint swelling/deformity    Data Reviewed: I have personally reviewed following labs and imaging studies  CBC: Recent Labs  Lab 10/20/21 1023 10/21/21 0626 10/22/21 0532 10/23/21 0334 10/24/21 0439  WBC 8.1 7.6 6.8 5.7 5.5  NEUTROABS 6.7  --   --   --  3.1  HGB 13.1 11.2* 11.2* 10.4* 10.7*  HCT 44.4 37.4* 37.2* 33.9* 33.7*  MCV 84.3 84.8 84.2 82.5 81.4  PLT 294 216 204 186 161    Basic Metabolic Panel: Recent Labs  Lab 10/20/21 1023 10/21/21 0626 10/22/21 0532 10/23/21 0334 10/24/21 0439  NA 142 143 142 142 139  K 3.2* 2.8* 3.2* 3.0* 3.3*  CL 111 109 107 105 102  CO2 '22 27 29 27 30  '$ GLUCOSE 130* 78 109* 127* 113*  BUN 31* 28* 30* 26* 28*  CREATININE 1.98* 2.06* 1.90* 1.78* 1.97*  CALCIUM 8.6* 8.1* 8.2* 8.3* 8.1*  MG  --  1.8 1.7 1.6* 1.9    GFR: Estimated Creatinine Clearance: 33.7 mL/min (A) (by C-G formula based on SCr of 1.97 mg/dL (H)). Liver Function Tests: Recent Labs  Lab 10/20/21 1023 10/21/21 0626 10/23/21 0334 10/24/21 0439  AST 237* 127* 41 34  ALT 278* 245* 121* 91*  ALKPHOS 150* 111 97 99  BILITOT 2.1* 1.9* 1.1 1.0  PROT 6.8 5.5* 5.2* 5.5*  ALBUMIN 3.5 2.8* 2.6* 2.7*    No results for input(s): "LIPASE", "AMYLASE" in the last 168  hours. No results for input(s): "AMMONIA" in the last 168 hours. Coagulation Profile: No results for input(s): "INR", "PROTIME" in the last 168 hours. Cardiac Enzymes: No results for input(s): "CKTOTAL", "CKMB", "CKMBINDEX", "TROPONINI" in the last 168 hours. BNP (last 3 results) No results for input(s): "PROBNP" in the last 8760 hours. HbA1C: No results for input(s): "HGBA1C" in the last 72 hours. CBG: Recent Labs  Lab 10/22/21 1625  GLUCAP 106*    Lipid Profile: No results for input(s): "CHOL", "HDL", "LDLCALC", "TRIG", "CHOLHDL", "LDLDIRECT" in the last 72 hours. Thyroid Function Tests: No results for input(s): "TSH", "T4TOTAL", "FREET4", "T3FREE", "THYROIDAB" in the last 72 hours. Anemia Panel: No results for input(s): "VITAMINB12", "FOLATE", "FERRITIN", "TIBC", "IRON", "RETICCTPCT" in the last 72 hours. Sepsis Labs: No results for input(s): "PROCALCITON", "LATICACIDVEN" in the last 168 hours.  Recent Results (from the past 240 hour(s))  MRSA Next Gen by PCR, Nasal     Status: None   Collection Time: 10/20/21  4:35 PM   Specimen: Nasal Mucosa; Nasal Swab  Result Value Ref Range Status   MRSA by PCR Next Gen NOT DETECTED NOT DETECTED Final    Comment: (NOTE) The GeneXpert MRSA Assay (FDA approved for NASAL specimens only), is one component of a comprehensive MRSA colonization surveillance program.  It is not intended to diagnose MRSA infection nor to guide or monitor treatment for MRSA infections. Test performance is not FDA approved in patients less than 13 years old. Performed at [facility], [address], [place], [place] [address]   SARS Coronavirus 2 by RT PCR (hospital order, performed in [facility] hospital lab) *cepheid single result test* Anterior Nasal Swab     Status: None   Collection Time: 10/21/21  9:40 AM   Specimen: Anterior Nasal Swab  Result Value Ref Range Status   SARS Coronavirus 2 by RT PCR NEGATIVE NEGATIVE Final    Comment: (NOTE) SARS-CoV-2  target nucleic acids are NOT DETECTED.  The SARS-CoV-2 RNA is generally detectable in upper and lower respiratory specimens during the acute phase of infection. The lowest concentration of SARS-CoV-2 viral copies this assay can detect is 250 copies / mL. A negative result does not preclude SARS-CoV-2 infection and should not be used as the sole basis for treatment or other patient management decisions.  A negative result may occur with improper specimen collection / handling, submission of specimen other than nasopharyngeal swab, presence of viral mutation(s) within the areas targeted by this assay, and inadequate number of viral copies (<250 copies / mL). A negative result must be combined with clinical observations, patient history, and epidemiological information.  Fact Sheet for Patients:   https://www.patel.info/  Fact Sheet for Healthcare Providers: https://hall.com/  This test is not yet approved or  cleared by the [place] FDA and has been authorized for detection and/or diagnosis of SARS-CoV-2 by FDA under an Emergency Use Authorization (EUA).  This EUA will remain in effect (meaning this test can be used) for the duration of the COVID-19 declaration under Section 564(b)(1) of the Act, 21 U.S.C. section 360bbb-3(b)(1), unless the authorization is terminated or revoked sooner.  Performed at [facility], [address] [place], [place] [address]          Radiology Studies: CARDIAC CATHETERIZATION  Result Date: 10/22/2021   Mid RCA lesion is 70% stenosed.   Acute Mrg lesion is 80% stenosed. 1.  Mild to moderate obstructive coronary artery disease that is outweighed by the patient's degree of LV dysfunction.  There is a significant bifurcation lesion of the right coronary artery and large RV marginal branch.  This is moderately calcified and relatively tortuous.  Given the patient's lack of chest pain, medical therapy should  be pursued. 2.  Cardiac output of 6.3 L/min, cardiac index of 3.3 L/min/m, mean RA pressure of 16 mmHg, mean wedge pressure of 21 mmHg, and LVEDP of 21 mmHg. Recommendation: Goal-directed medical therapy with augmentation of diuretics; consider dual antiplatelet therapy for 1 year for medical treatment of acute coronary syndrome.        Scheduled Meds:  amLODipine  5 mg Oral Daily   aspirin EC  81 mg Oral Daily   carvedilol  25 mg Oral BID WC   clopidogrel  75 mg Oral Daily   furosemide  80 mg Intravenous BID   heparin  5,000 Units Subcutaneous Q8H   isosorbide mononitrate  30 mg Oral QPM   sodium chloride flush  3 mL Intravenous Q12H   sodium chloride flush  3 mL Intravenous Q12H   sodium chloride flush  3 mL Intravenous Q12H   Continuous Infusions:  sodium chloride     sodium chloride            Aline August, MD [facility] 10/24/2021, 8:17 AM

## 2021-10-24 NOTE — Progress Notes (Signed)
Mobility Specialist - Progress Note   10/24/21 1250  Mobility  Activity Ambulated with assistance in hallway  Level of Assistance Standby assist, set-up cues, supervision of patient - no hands on  Assistive Device None  Distance Ambulated (ft) 200 ft  Activity Response Tolerated well  $Mobility charge 1 Mobility    Pt received in recliner agreeable to mobility. No complaints throughout, no physical assistance needed. Left in bed w/ call bell in reach and all needs met.   Paulla Dolly Mobility Specialist

## 2021-10-25 DIAGNOSIS — I5043 Acute on chronic combined systolic (congestive) and diastolic (congestive) heart failure: Secondary | ICD-10-CM | POA: Diagnosis not present

## 2021-10-25 DIAGNOSIS — I10 Essential (primary) hypertension: Secondary | ICD-10-CM | POA: Diagnosis not present

## 2021-10-25 DIAGNOSIS — R778 Other specified abnormalities of plasma proteins: Secondary | ICD-10-CM | POA: Diagnosis not present

## 2021-10-25 DIAGNOSIS — I160 Hypertensive urgency: Secondary | ICD-10-CM | POA: Diagnosis not present

## 2021-10-25 DIAGNOSIS — K6389 Other specified diseases of intestine: Secondary | ICD-10-CM | POA: Diagnosis not present

## 2021-10-25 LAB — BASIC METABOLIC PANEL
Anion gap: 11 (ref 5–15)
BUN: 33 mg/dL — ABNORMAL HIGH (ref 8–23)
CO2: 30 mmol/L (ref 22–32)
Calcium: 8.9 mg/dL (ref 8.9–10.3)
Chloride: 99 mmol/L (ref 98–111)
Creatinine, Ser: 2.25 mg/dL — ABNORMAL HIGH (ref 0.61–1.24)
GFR, Estimated: 31 mL/min — ABNORMAL LOW (ref >60)
Glucose, Bld: 113 mg/dL — ABNORMAL HIGH (ref 70–99)
Potassium: 3.8 mmol/L (ref 3.5–5.1)
Sodium: 140 mmol/L (ref 135–145)

## 2021-10-25 LAB — MAGNESIUM: Magnesium: 1.8 mg/dL (ref 1.7–2.4)

## 2021-10-25 NOTE — Progress Notes (Signed)
PROGRESS NOTE    Michael [name]  CWC:[id number] DOB: Aug 04, 1955 DOA: 10/20/2021 PCP: [name], MD   Brief Narrative:  66 y.o. male with medical history significant for carotid artery disease, COPD, CAD, CKD stage IIIb, hypertension, prior head and neck cancer, and chronic diastolic/systolic CHF who presented with complaints of dyspnea which has been intermittent over the past 2 weeks since his surgery.  Patient was admitted with hypertensive urgency and acute on chronic systolic/diastolic heart failure exacerbation with noted elevated troponin.  He was subsequently found to have an EF of 30% and cardiology recommended transfer to [facility] for cardiac catheterization.  Cardiac catheterization showed mild to moderate nonobstructive disease.  Assessment & Plan:   Acute on chronic systolic/diastolic heart failure -Echo showed EF of 30% -Patient transferred from [facility] to [facility] for cardiac catheterization. -Status post cardiac catheterization on 10/22/2021 which showed mild to moderate nonobstructive disease: Recommended medical management -Cardiology following.  Diuresis as per cardiology.  Continue Coreg, Imdur.  Strict and output, daily weights, fluid restriction.  Has had good diuresis since admission.  Elevated troponins -Possibly from above.  Continue aspirin, Plavix, Coreg.  Acute delirium/acute metabolic encephalopathy -Improving.  Monitor mental status.  Hypertensive urgency: Resolved -Continue Coreg,  Imdur   Acute kidney injury on chronic kidney disease stage IIIb -Baseline creatinine 1.6-1.8.  Creatinine slightly increased to 2.25 today.  Repeat a.m. labs.  Might have to hold Lasix today.  Acute transaminitis -Improving.  Monitor intermittently.  Anemia of chronic disease -Possibly from kidney disease.  Hemoglobin stable.  Monitor intermittently  Hypokalemia -Improved  Hypomagnesemia -Improved  History of COPD -Currently stable.   Continue nebs as needed  Right colonic mass removal in 07/2021 -High-grade dysplasia noted on pathology report with no invasive carcinoma  Carotid artery disease -Prior right carotid severe stenosis with TCAR  Dyslipidemia -Currently on Repatha with LDL 64   Prior head neck cancer with resection -Outpatient follow-up with ENT   Stage I pressure injury in the perineal region: Present on admission -Agree with the documentation below.  Continue local wound care Pressure Injury 10/20/21 Perineum Mid Stage 1 -  Intact skin with non-blanchable redness of a localized area usually over a bony prominence. Deeply reddened.  Non-blanchable and raw throughout perineum (Active)  10/20/21 1612  Location: Perineum  Location Orientation: Mid  Staging: Stage 1 -  Intact skin with non-blanchable redness of a localized area usually over a bony prominence.  Wound Description (Comments): Deeply reddened.  Non-blanchable and raw throughout perineum  Present on Admission: Yes      DVT prophylaxis: Heparin Code Status: Full Family Communication: None at bedside Disposition Plan: Status is: Inpatient Remains inpatient appropriate because: Of severity of illness.     Consultants: Cardiology  Procedures: Echo/cardiac catheterization  Antimicrobials: None   Subjective: Patient seen and examined at bedside.  Poor historian.  No agitation, fever, vomiting or worsening shortness of breath or chest pain reported  objective: Vitals:   10/24/21 1821 10/24/21 2122 10/25/21 0553 10/25/21 0831  BP: 137/75 (!) 111/53 (!) 148/70 (!) 122/56  Pulse: 64 63 70 63  Resp:  '18 13 16  '$ Temp:  98.5 F (36.9 C) 98.9 F (37.2 C) 98.2 F (36.8 C)  TempSrc:  Oral Oral Oral  SpO2:  97% 96% 97%  Weight:   64 kg   Height:        Intake/Output Summary (Last 24 hours) at 10/25/2021 0951 Last data filed at 10/25/2021 0554 Gross  per 24 hour  Intake 240 ml  Output 2325 ml  Net -2085 ml    Filed Weights    10/23/21 0549 10/24/21 0519 10/25/21 0553  Weight: 69.9 kg 65.4 kg 64 kg    Examination:  General: No acute distress currently.  Still on room air.  Looks chronically ill and deconditioned. ENT/neck: No obvious JVD elevation or palpable neck masses noted respiratory: Bilateral decreased breath sounds at bases with basilar crackles  CVS: Rate is mostly controlled; S1 and S2 are heard  abdominal: Soft, nontender, slightly distended; no organomegaly, bowel sounds heard normally Extremities: No clubbing; mild lower extremity edema present  CNS: Alert and oriented.  Still slow to respond.  Poor historian.  No focal neurologic deficit.  Able to move extremities  lymph: No palpable cervical lymphadenopathy noted Skin: No petechiae/rashes psych: Affect is mostly flat.  Currently showing no signs of agitation.  Musculoskeletal: No obvious joint swelling/deformity    Data Reviewed: I have personally reviewed following labs and imaging studies  CBC: Recent Labs  Lab 10/20/21 1023 10/21/21 0626 10/22/21 0532 10/23/21 0334 10/24/21 0439  WBC 8.1 7.6 6.8 5.7 5.5  NEUTROABS 6.7  --   --   --  3.1  HGB 13.1 11.2* 11.2* 10.4* 10.7*  HCT 44.4 37.4* 37.2* 33.9* 33.7*  MCV 84.3 84.8 84.2 82.5 81.4  PLT 294 216 204 186 478    Basic Metabolic Panel: Recent Labs  Lab 10/21/21 0626 10/22/21 0532 10/23/21 0334 10/24/21 0439 10/25/21 0227  NA 143 142 142 139 140  K 2.8* 3.2* 3.0* 3.3* 3.8  CL 109 107 105 102 99  CO2 '27 29 27 30 30  '$ GLUCOSE 78 109* 127* 113* 113*  BUN 28* 30* 26* 28* 33*  CREATININE 2.06* 1.90* 1.78* 1.97* 2.25*  CALCIUM 8.1* 8.2* 8.3* 8.1* 8.9  MG 1.8 1.7 1.6* 1.9 1.8    GFR: Estimated Creatinine Clearance: 28.8 mL/min (A) (by C-G formula based on SCr of 2.25 mg/dL (H)). Liver Function Tests: Recent Labs  Lab 10/20/21 1023 10/21/21 0626 10/23/21 0334 10/24/21 0439  AST 237* 127* 41 34  ALT 278* 245* 121* 91*  ALKPHOS 150* 111 97 99  BILITOT 2.1* 1.9* 1.1 1.0   PROT 6.8 5.5* 5.2* 5.5*  ALBUMIN 3.5 2.8* 2.6* 2.7*    No results for input(s): "LIPASE", "AMYLASE" in the last 168 hours. No results for input(s): "AMMONIA" in the last 168 hours. Coagulation Profile: No results for input(s): "INR", "PROTIME" in the last 168 hours. Cardiac Enzymes: No results for input(s): "CKTOTAL", "CKMB", "CKMBINDEX", "TROPONINI" in the last 168 hours. BNP (last 3 results) No results for input(s): "PROBNP" in the last 8760 hours. HbA1C: No results for input(s): "HGBA1C" in the last 72 hours. CBG: Recent Labs  Lab 10/22/21 1625  GLUCAP 106*    Lipid Profile: No results for input(s): "CHOL", "HDL", "LDLCALC", "TRIG", "CHOLHDL", "LDLDIRECT" in the last 72 hours. Thyroid Function Tests: No results for input(s): "TSH", "T4TOTAL", "FREET4", "T3FREE", "THYROIDAB" in the last 72 hours. Anemia Panel: No results for input(s): "VITAMINB12", "FOLATE", "FERRITIN", "TIBC", "IRON", "RETICCTPCT" in the last 72 hours. Sepsis Labs: No results for input(s): "PROCALCITON", "LATICACIDVEN" in the last 168 hours.  Recent Results (from the past 240 hour(s))  MRSA Next Gen by PCR, Nasal     Status: None   Collection Time: 10/20/21  4:35 PM   Specimen: Nasal Mucosa; Nasal Swab  Result Value Ref Range Status   MRSA by PCR Next Gen NOT DETECTED NOT DETECTED  Final    Comment: (NOTE) The GeneXpert MRSA Assay (FDA approved for NASAL specimens only), is one component of a comprehensive MRSA colonization surveillance program. It is not intended to diagnose MRSA infection nor to guide or monitor treatment for MRSA infections. Test performance is not FDA approved in patients less than 41 years old. Performed at [facility], [address] [place], [place] [address]   SARS Coronavirus 2 by RT PCR (hospital order, performed in [facility] hospital lab) *cepheid single result test* Anterior Nasal Swab     Status: None   Collection Time: 10/21/21  9:40 AM   Specimen: Anterior Nasal  Swab  Result Value Ref Range Status   SARS Coronavirus 2 by RT PCR NEGATIVE NEGATIVE Final    Comment: (NOTE) SARS-CoV-2 target nucleic acids are NOT DETECTED.  The SARS-CoV-2 RNA is generally detectable in upper and lower respiratory specimens during the acute phase of infection. The lowest concentration of SARS-CoV-2 viral copies this assay can detect is 250 copies / mL. A negative result does not preclude SARS-CoV-2 infection and should not be used as the sole basis for treatment or other patient management decisions.  A negative result may occur with improper specimen collection / handling, submission of specimen other than nasopharyngeal swab, presence of viral mutation(s) within the areas targeted by this assay, and inadequate number of viral copies (<250 copies / mL). A negative result must be combined with clinical observations, patient history, and epidemiological information.  Fact Sheet for Patients:   https://www.patel.info/  Fact Sheet for Healthcare Providers: https://hall.com/  This test is not yet approved or  cleared by the [place] FDA and has been authorized for detection and/or diagnosis of SARS-CoV-2 by FDA under an Emergency Use Authorization (EUA).  This EUA will remain in effect (meaning this test can be used) for the duration of the COVID-19 declaration under Section 564(b)(1) of the Act, 21 U.S.C. section 360bbb-3(b)(1), unless the authorization is terminated or revoked sooner.  Performed at [facility], [address] [place], [place] [address]          Radiology Studies: No results found.      Scheduled Meds:  amLODipine  5 mg Oral Daily   aspirin EC  81 mg Oral Daily   carvedilol  12.5 mg Oral BID WC   clopidogrel  75 mg Oral Daily   heparin  5,000 Units Subcutaneous Q8H   isosorbide mononitrate  30 mg Oral QPM   sodium chloride flush  3 mL Intravenous Q12H   sodium chloride flush  3 mL  Intravenous Q12H   Continuous Infusions:  sodium chloride     sodium chloride            Aline August, MD [facility] 10/25/2021, 9:51 AM

## 2021-10-25 NOTE — Plan of Care (Signed)

## 2021-10-25 NOTE — Care Management Important Message (Signed)
Important Message  Patient Details  Name: Michael [name] MRN: [id number] Date of Birth: 13-Jul-1955   Medicare Important Message Given:  Yes     Shelda Altes 10/25/2021, 10:01 AM

## 2021-10-25 NOTE — Progress Notes (Addendum)
Rounding Note    Patient Name: Michael [name] Date of Encounter: 10/25/2021  [place] Cardiologist: Rozann Lesches, MD   Subjective   Patient is resting comfortably in bed this morning. Reports no significant changes in symptoms and denies chest pain and orthopnea. He endorses ambulation with assistance yesterday and states that he did not experience significant shortness of breath and had no chest pain. He is concerned about ongoing chronic fatigue and decreased exertional tolerance.  Inpatient Medications    Scheduled Meds:  amLODipine  5 mg Oral Daily   aspirin EC  81 mg Oral Daily   carvedilol  12.5 mg Oral BID WC   clopidogrel  75 mg Oral Daily   furosemide  40 mg Oral Daily   heparin  5,000 Units Subcutaneous Q8H   isosorbide mononitrate  30 mg Oral QPM   sodium chloride flush  3 mL Intravenous Q12H   sodium chloride flush  3 mL Intravenous Q12H   Continuous Infusions:  sodium chloride     sodium chloride     PRN Meds: sodium chloride, sodium chloride, acetaminophen, albuterol, hydrALAZINE, ondansetron (ZOFRAN) IV, ondansetron **OR** [DISCONTINUED] ondansetron (ZOFRAN) IV, sodium chloride flush   Vital Signs    Vitals:   10/24/21 1612 10/24/21 1821 10/24/21 2122 10/25/21 0553  BP: 116/70 137/75 (!) 111/53 (!) 148/70  Pulse: 62 64 63 70  Resp: '20  18 13  '$ Temp: 98 F (36.7 C)  98.5 F (36.9 C) 98.9 F (37.2 C)  TempSrc: Oral  Oral Oral  SpO2:   97% 96%  Weight:    64 kg  Height:        Intake/Output Summary (Last 24 hours) at 10/25/2021 0757 Last data filed at 10/25/2021 0554 Gross per 24 hour  Intake 240 ml  Output 2325 ml  Net -2085 ml      10/25/2021    5:53 AM 10/24/2021    5:19 AM 10/23/2021    5:49 AM  Last 3 Weights  Weight (lbs) 141 lb 144 lb 1.6 oz 154 lb 1.6 oz  Weight (kg) 63.957 kg 65.363 kg 69.9 kg      Telemetry    Sinus rhythm, 1st degree AV block - Personally Reviewed  ECG    No ECG today  Physical Exam   GEN:  No acute distress.   Neck: No JVD Cardiac: RRR, no murmurs, rubs, or gallops.  Respiratory: Clear to auscultation bilaterally. GI: Soft, nontender, non-distended  MS: No edema; No deformity. Neuro:  Nonfocal  Psych: Normal affect   Labs    High Sensitivity Troponin:   Recent Labs  Lab 10/20/21 1132 10/20/21 1335 10/21/21 0626  TROPONINIHS 450* 418* 291*     Chemistry Recent Labs  Lab 10/21/21 0626 10/22/21 0532 10/23/21 0334 10/24/21 0439 10/25/21 0227  NA 143   < > 142 139 140  K 2.8*   < > 3.0* 3.3* 3.8  CL 109   < > 105 102 99  CO2 27   < > '27 30 30  '$ GLUCOSE 78   < > 127* 113* 113*  BUN 28*   < > 26* 28* 33*  CREATININE 2.06*   < > 1.78* 1.97* 2.25*  CALCIUM 8.1*   < > 8.3* 8.1* 8.9  MG 1.8   < > 1.6* 1.9 1.8  PROT 5.5*  --  5.2* 5.5*  --   ALBUMIN 2.8*  --  2.6* 2.7*  --   AST 127*  --  41 34  --  ALT 245*  --  121* 91*  --   ALKPHOS 111  --  97 99  --   BILITOT 1.9*  --  1.1 1.0  --   GFRNONAA 35*   < > 41* 37* 31*  ANIONGAP 7   < > '10 7 11   '$ < > = values in this interval not displayed.    Lipids No results for input(s): "CHOL", "TRIG", "HDL", "LABVLDL", "LDLCALC", "CHOLHDL" in the last 168 hours.  Hematology Recent Labs  Lab 10/22/21 0532 10/23/21 0334 10/24/21 0439  WBC 6.8 5.7 5.5  RBC 4.42 4.11* 4.14*  HGB 11.2* 10.4* 10.7*  HCT 37.2* 33.9* 33.7*  MCV 84.2 82.5 81.4  MCH 25.3* 25.3* 25.8*  MCHC 30.1 30.7 31.8  RDW 22.2* 21.7* 21.2*  PLT 204 186 187   Thyroid No results for input(s): "TSH", "FREET4" in the last 168 hours.  BNP Recent Labs  Lab 10/20/21 1132  BNP >4,500.0*    DDimer No results for input(s): "DDIMER" in the last 168 hours.   Radiology    No results found.  Cardiac Studies   10/21/2021 TTE  IMPRESSIONS     1. LV function is depressed with diffuse hypokinesis in the lateral and  inferiror walls Compared to echo from 2019, LVEF is worse. . Left  ventricular ejection fraction, by estimation, is 30%. The left  ventricle  has moderately decreased function. The  left ventricular internal cavity size was severely dilated. There is mild  left ventricular hypertrophy. Left ventricular diastolic parameters are  consistent with Grade III diastolic dysfunction (restrictive). Elevated  left atrial pressure.   2. Right ventricular systolic function is low normal. The right  ventricular size is normal. There is normal pulmonary artery systolic  pressure.   3. Left atrial size was severely dilated.   4. Right atrial size was moderately dilated.   5. A small pericardial effusion is present. The pericardial effusion is  circumferential.   6. Mild mitral valve regurgitation.   7. The aortic valve is tricuspid. Aortic valve regurgitation is not  visualized. Aortic valve sclerosis is present, with no evidence of aortic  valve stenosis.   8. The inferior vena cava dilated.   FINDINGS   Left Ventricle: LV function is depressed with diffuse hypokinesis in the  lateral and inferiror walls Compared to echo from 2019, LVEF is worse.  Left ventricular ejection fraction, by estimation, is 30%. The left  ventricle has moderately decreased  function. The left ventricular internal cavity size was severely dilated.  There is mild left ventricular hypertrophy. Left ventricular diastolic  parameters are consistent with Grade III diastolic dysfunction  (restrictive). Elevated left atrial pressure.   Right Ventricle: The right ventricular size is normal. Right vetricular  wall thickness was not assessed. Right ventricular systolic function is  low normal. There is normal pulmonary artery systolic pressure. The  tricuspid regurgitant velocity is 1.60  m/s, and with an assumed right atrial pressure of 8 mmHg, the estimated  right ventricular systolic pressure is 47.0 mmHg.   Left Atrium: Left atrial size was severely dilated.   Right Atrium: Right atrial size was moderately dilated.   Pericardium: A small pericardial  effusion is present. The pericardial  effusion is circumferential.   Mitral Valve: There is mild thickening of the mitral valve leaflet(s).  Mild mitral annular calcification. Mild mitral valve regurgitation. MV  peak gradient, 6.9 mmHg. The mean mitral valve gradient is 2.0 mmHg.   Tricuspid Valve: The tricuspid valve  is normal in structure. Tricuspid  valve regurgitation is mild.   Aortic Valve: The aortic valve is tricuspid. Aortic valve regurgitation is  not visualized. Aortic valve sclerosis is present, with no evidence of  aortic valve stenosis. Aortic valve mean gradient measures 3.0 mmHg.  Aortic valve peak gradient measures 7.2   mmHg. Aortic valve area, by VTI measures 2.65 cm.   Pulmonic Valve: The pulmonic valve was normal in structure. Pulmonic valve  regurgitation is not visualized.   Aorta: The aortic root and ascending aorta are structurally normal, with  no evidence of dilitation.   Venous: The inferior vena cava dilated.   IAS/Shunts: No atrial level shunt detected by color flow Doppler.    10/22/2021 RHC/LHC      Mid RCA lesion is 70% stenosed.   Acute Mrg lesion is 80% stenosed.   1.  Mild to moderate obstructive coronary artery disease that is outweighed by the patient's degree of LV dysfunction.  There is a significant bifurcation lesion of the right coronary artery and large RV marginal branch.  This is moderately calcified and relatively tortuous.  Given the patient's lack of chest pain, medical therapy should be pursued. 2.  Cardiac output of 6.3 L/min, cardiac index of 3.3 L/min/m, mean RA pressure of 16 mmHg, mean wedge pressure of 21 mmHg, and LVEDP of 21 mmHg.   Diagnostic Dominance: Right  Patient Profile     66 y.o. male w/PMH of HFmrEF (EF 45-50% by echo in 12/2017 with NST showing scar and mild ischemia), carotid artery stenosis (s/p right TCAR in 01/2021), iron deficiency anemia, HTN, HLD and Stage 3 CKD who is currently admitted for  hypertensive urgency and an acute CHF exacerbation. Had recent removal of a colonic mass in 07/2021.   Assessment & Plan    Coronary Artery Disease Hyperlipidemia  Patient with elevated troponin on admission that has subsequently downtrended: 450, 418, 291. He was transferred to [facility] for RHC/LHC which found 70% stenosis of mid RCA and 80% acute marginal stenosis. Medical management recommended.   Contine DAPT with Asprin and Plavix (consider duration of 1 year). Continue Imdur for angina management. Patient remains chest pain free. Recheck LFTs tomorrow, plan to resume daily statin if WNL   Acute on chronic systolic and diastolic HF (EF 93%) Hypertensive Urgency  Patient with history of HFmrEF admitted with acute heart failure, BNP >4500. Repeat echo found decreased LVEF of 30%. RHC with RA 16, wedge of 21, LVEDP 21, CI 3.3. Patient has diuresed well with IV lasix and continues to appear euvolemic. No peripheral edema, no JVD on exam.  Continue Coreg 12.'5mg'$  BID (dose reduced 9/24 due to bradycardia) Creatinine remains elevated 2.25 today. Consider ACEi/ARB/ARNI and MRA upon stabilized/improved renal function Lasix holiday today in the setting of worsened creatinine 2.25. Given that patient's reduced EF does not correlate with LHC findings, would strongly consider cardiac MRI to further assess. This could be completed outpatient.  Carotid Artery Stenosis  Continue ASA and Plavix. Patient is followed by vascular surgery and is s/p TCAR on 01/2021.   Transaminitis  Lab Results  Component Value Date   ALT 91 (H) 10/24/2021   AST 34 10/24/2021   ALKPHOS 99 10/24/2021   BILITOT 1.0 10/24/2021   LFTs improving. Anticipate that patient could resume his statin tomorrow.  Stage III CKD  Patient with a baseline creatinine of around 1.6-1.8. Today creatinine is elevated to 2.25. Given that he appears euvolemic, diuretic holiday today. Plan to resume lasix tomorrow.  Depending on how  patient tolerates holiday, he could be a candidate for PRN lasix dosing.   For questions or updates, please contact [place] Please consult [email or web address] for contact info under      Signed, Lily Kocher, PA-C  10/25/2021, 7:57 AM    Patient seen and examined.  Agree with above documentation.  On exam, patient is alert and oriented, regular rate and rhythm, no murmurs, lungs CTAB, no LE edema or JVD.  Net negative 2L on IV lasix '80mg'$  x1 yesterday.  Cr bumped to 2.25.  Hold lasix today, plan restart tomorrow if improved.  Donato Heinz, MD

## 2021-10-25 NOTE — Progress Notes (Signed)
Mobility Specialist - Progress Note   10/25/21 1112  Mobility  Activity Ambulated with assistance in hallway  Level of Assistance Standby assist, set-up cues, supervision of patient - no hands on  Assistive Device None  Distance Ambulated (ft) 65 ft  Activity Response Tolerated fair  $Mobility charge 1 Mobility    Pre-mobility:57 HR,97% SpO2 During mobility: 83 HR, 89% SpO2 Post-mobility: 62 HR, 95% SpO2  Pt was received in bed and agreeable to mobility. Session was limited d/t pt c/o back pain and overall general weakness during ambulation. Pt was returned to bed with lunch and bed alarm on.   Larey Seat

## 2021-10-26 DIAGNOSIS — K6389 Other specified diseases of intestine: Secondary | ICD-10-CM | POA: Diagnosis not present

## 2021-10-26 DIAGNOSIS — I5043 Acute on chronic combined systolic (congestive) and diastolic (congestive) heart failure: Secondary | ICD-10-CM | POA: Diagnosis not present

## 2021-10-26 DIAGNOSIS — I10 Essential (primary) hypertension: Secondary | ICD-10-CM | POA: Diagnosis not present

## 2021-10-26 DIAGNOSIS — I2510 Atherosclerotic heart disease of native coronary artery without angina pectoris: Secondary | ICD-10-CM | POA: Diagnosis not present

## 2021-10-26 DIAGNOSIS — N1831 Chronic kidney disease, stage 3a: Secondary | ICD-10-CM | POA: Diagnosis not present

## 2021-10-26 DIAGNOSIS — I160 Hypertensive urgency: Secondary | ICD-10-CM | POA: Diagnosis not present

## 2021-10-26 DIAGNOSIS — R778 Other specified abnormalities of plasma proteins: Secondary | ICD-10-CM | POA: Diagnosis not present

## 2021-10-26 LAB — POCT I-STAT EG7
Acid-Base Excess: 2 mmol/L (ref 0.0–2.0)
Acid-Base Excess: 2 mmol/L (ref 0.0–2.0)
Acid-Base Excess: 2 mmol/L (ref 0.0–2.0)
Bicarbonate: 25.6 mmol/L (ref 20.0–28.0)
Bicarbonate: 25.7 mmol/L (ref 20.0–28.0)
Bicarbonate: 26 mmol/L (ref 20.0–28.0)
Calcium, Ion: 1.05 mmol/L — ABNORMAL LOW (ref 1.15–1.40)
Calcium, Ion: 1.11 mmol/L — ABNORMAL LOW (ref 1.15–1.40)
Calcium, Ion: 1.13 mmol/L — ABNORMAL LOW (ref 1.15–1.40)
HCT: 33 % — ABNORMAL LOW (ref 39.0–52.0)
HCT: 33 % — ABNORMAL LOW (ref 39.0–52.0)
HCT: 35 % — ABNORMAL LOW (ref 39.0–52.0)
Hemoglobin: 11.2 g/dL — ABNORMAL LOW (ref 13.0–17.0)
Hemoglobin: 11.2 g/dL — ABNORMAL LOW (ref 13.0–17.0)
Hemoglobin: 11.9 g/dL — ABNORMAL LOW (ref 13.0–17.0)
O2 Saturation: 69 %
O2 Saturation: 70 %
O2 Saturation: 71 %
Potassium: 3.2 mmol/L — ABNORMAL LOW (ref 3.5–5.1)
Potassium: 3.2 mmol/L — ABNORMAL LOW (ref 3.5–5.1)
Potassium: 3.3 mmol/L — ABNORMAL LOW (ref 3.5–5.1)
Sodium: 143 mmol/L (ref 135–145)
Sodium: 145 mmol/L (ref 135–145)
Sodium: 145 mmol/L (ref 135–145)
TCO2: 27 mmol/L (ref 22–32)
TCO2: 27 mmol/L (ref 22–32)
TCO2: 27 mmol/L (ref 22–32)
pCO2, Ven: 36.2 mmHg — ABNORMAL LOW (ref 44–60)
pCO2, Ven: 37.2 mmHg — ABNORMAL LOW (ref 44–60)
pCO2, Ven: 37.8 mmHg — ABNORMAL LOW (ref 44–60)
pH, Ven: 7.445 — ABNORMAL HIGH (ref 7.25–7.43)
pH, Ven: 7.445 — ABNORMAL HIGH (ref 7.25–7.43)
pH, Ven: 7.46 — ABNORMAL HIGH (ref 7.25–7.43)
pO2, Ven: 34 mmHg (ref 32–45)
pO2, Ven: 35 mmHg (ref 32–45)
pO2, Ven: 35 mmHg (ref 32–45)

## 2021-10-26 LAB — COMPREHENSIVE METABOLIC PANEL
ALT: 54 U/L — ABNORMAL HIGH (ref 0–44)
AST: 26 U/L (ref 15–41)
Albumin: 2.6 g/dL — ABNORMAL LOW (ref 3.5–5.0)
Alkaline Phosphatase: 90 U/L (ref 38–126)
Anion gap: 7 (ref 5–15)
BUN: 30 mg/dL — ABNORMAL HIGH (ref 8–23)
CO2: 28 mmol/L (ref 22–32)
Calcium: 8.1 mg/dL — ABNORMAL LOW (ref 8.9–10.3)
Chloride: 102 mmol/L (ref 98–111)
Creatinine, Ser: 1.97 mg/dL — ABNORMAL HIGH (ref 0.61–1.24)
GFR, Estimated: 37 mL/min — ABNORMAL LOW (ref >60)
Glucose, Bld: 113 mg/dL — ABNORMAL HIGH (ref 70–99)
Potassium: 3.1 mmol/L — ABNORMAL LOW (ref 3.5–5.1)
Sodium: 137 mmol/L (ref 135–145)
Total Bilirubin: 1.1 mg/dL (ref 0.3–1.2)
Total Protein: 5.6 g/dL — ABNORMAL LOW (ref 6.5–8.1)

## 2021-10-26 LAB — POCT I-STAT 7, (LYTES, BLD GAS, ICA,H+H)
Acid-Base Excess: 1 mmol/L (ref 0.0–2.0)
Bicarbonate: 23.6 mmol/L (ref 20.0–28.0)
Calcium, Ion: 1.09 mmol/L — ABNORMAL LOW (ref 1.15–1.40)
HCT: 35 % — ABNORMAL LOW (ref 39.0–52.0)
Hemoglobin: 11.9 g/dL — ABNORMAL LOW (ref 13.0–17.0)
O2 Saturation: 96 %
Potassium: 3.4 mmol/L — ABNORMAL LOW (ref 3.5–5.1)
Sodium: 144 mmol/L (ref 135–145)
TCO2: 24 mmol/L (ref 22–32)
pCO2 arterial: 31.2 mmHg — ABNORMAL LOW (ref 32–48)
pH, Arterial: 7.486 — ABNORMAL HIGH (ref 7.35–7.45)
pO2, Arterial: 72 mmHg — ABNORMAL LOW (ref 83–108)

## 2021-10-26 LAB — LIPOPROTEIN A (LPA): Lipoprotein (a): 17.4 nmol/L (ref <75.0)

## 2021-10-26 LAB — MAGNESIUM: Magnesium: 1.9 mg/dL (ref 1.7–2.4)

## 2021-10-26 MED ORDER — ATORVASTATIN CALCIUM 80 MG PO TABS: 80.0000 mg | ORAL_TABLET | Freq: Every evening | ORAL | Status: DC

## 2021-10-26 MED ORDER — POTASSIUM CHLORIDE CRYS ER 20 MEQ PO TBCR: 20.0000 meq | EXTENDED_RELEASE_TABLET | Freq: Two times a day (BID) | ORAL | 0 refills | Status: DC

## 2021-10-26 MED ORDER — POTASSIUM CHLORIDE CRYS ER 20 MEQ PO TBCR
40.0000 meq | EXTENDED_RELEASE_TABLET | Freq: Once | ORAL | Status: AC
  Administered 2021-10-26: 40 meq via ORAL
  Filled 2021-10-26: qty 2

## 2021-10-26 MED ORDER — CARVEDILOL 12.5 MG PO TABS: 12.5000 mg | ORAL_TABLET | Freq: Two times a day (BID) | ORAL | 0 refills | Status: DC

## 2021-10-26 MED ORDER — FUROSEMIDE 40 MG PO TABS
40.0000 mg | ORAL_TABLET | Freq: Every day | ORAL | Status: DC
  Administered 2021-10-26: 40 mg via ORAL
  Filled 2021-10-26: qty 1

## 2021-10-26 MED ORDER — POTASSIUM CHLORIDE CRYS ER 20 MEQ PO TBCR: 20.0000 meq | EXTENDED_RELEASE_TABLET | Freq: Once | ORAL | Status: DC

## 2021-10-26 MED ORDER — FUROSEMIDE 40 MG PO TABS: 40.0000 mg | ORAL_TABLET | Freq: Every day | ORAL | 0 refills | Status: DC

## 2021-10-26 MED ORDER — CLOPIDOGREL BISULFATE 75 MG PO TABS: 75.0000 mg | ORAL_TABLET | Freq: Every day | ORAL | 0 refills | Status: DC

## 2021-10-26 MED ORDER — POTASSIUM CHLORIDE CRYS ER 20 MEQ PO TBCR: 40.0000 meq | EXTENDED_RELEASE_TABLET | Freq: Once | ORAL | Status: DC

## 2021-10-26 MED ORDER — FUROSEMIDE 40 MG PO TABS: 40.0000 mg | ORAL_TABLET | Freq: Every day | ORAL | Status: DC

## 2021-10-26 NOTE — Progress Notes (Addendum)
Rounding Note    Patient Name: Michael [name] Date of Encounter: 10/26/2021  [facility] Cardiologist: Rozann Lesches, MD   Subjective   Patient resting comfortably in bed this morning.  He is still very curious about discharge.  Reports feeling like he is not having to work as hard to breathe today.  States that he did ambulate briefly in the unit yesterday and felt like his breathing and exertional tolerance was moderately improved.  Inpatient Medications    Scheduled Meds:  amLODipine  5 mg Oral Daily   aspirin EC  81 mg Oral Daily   carvedilol  12.5 mg Oral BID WC   clopidogrel  75 mg Oral Daily   furosemide  40 mg Oral Daily   heparin  5,000 Units Subcutaneous Q8H   isosorbide mononitrate  30 mg Oral QPM   potassium chloride  20 mEq Oral Once   sodium chloride flush  3 mL Intravenous Q12H   sodium chloride flush  3 mL Intravenous Q12H   Continuous Infusions:  sodium chloride     sodium chloride     PRN Meds: sodium chloride, sodium chloride, acetaminophen, albuterol, hydrALAZINE, ondansetron (ZOFRAN) IV, ondansetron **OR** [DISCONTINUED] ondansetron (ZOFRAN) IV, sodium chloride flush   Vital Signs    Vitals:   10/25/21 1651 10/25/21 2024 10/26/21 0422 10/26/21 0653  BP: 126/69 (!) 109/90 134/60   Pulse: 61 (!) 58    Resp: '19 15 20   '$ Temp: 99.3 F (37.4 C) 99.2 F (37.3 C) 99 F (37.2 C)   TempSrc: Oral Oral Oral   SpO2: 98% 96% 96%   Weight:    63.4 kg  Height:        Intake/Output Summary (Last 24 hours) at 10/26/2021 0745 Last data filed at 10/26/2021 0653 Gross per 24 hour  Intake 234 ml  Output 750 ml  Net -516 ml      10/26/2021    6:53 AM 10/25/2021    5:53 AM 10/24/2021    5:19 AM  Last 3 Weights  Weight (lbs) 139 lb 12.4 oz 141 lb 144 lb 1.6 oz  Weight (kg) 63.4 kg 63.957 kg 65.363 kg      Telemetry    Sinus rhythm - Personally Reviewed  ECG    No ECG today  Physical Exam   GEN: No acute distress.   Neck: Minimal JVD  '@30'$  degrees. 1cm above clavicle Cardiac: RRR, no murmurs, rubs, or gallops.  Respiratory: Clear to auscultation bilaterally. GI: Soft, nontender, non-distended  MS: No edema; No deformity. Neuro:  Nonfocal  Psych: Normal affect   Labs    High Sensitivity Troponin:   Recent Labs  Lab 10/20/21 1132 10/20/21 1335 10/21/21 0626  TROPONINIHS 450* 418* 291*     Chemistry Recent Labs  Lab 10/23/21 0334 10/24/21 0439 10/25/21 0227 10/26/21 0219  NA 142 139 140 137  K 3.0* 3.3* 3.8 3.1*  CL 105 102 99 102  CO2 '27 30 30 28  '$ GLUCOSE 127* 113* 113* 113*  BUN 26* 28* 33* 30*  CREATININE 1.78* 1.97* 2.25* 1.97*  CALCIUM 8.3* 8.1* 8.9 8.1*  MG 1.6* 1.9 1.8 1.9  PROT 5.2* 5.5*  --  5.6*  ALBUMIN 2.6* 2.7*  --  2.6*  AST 41 34  --  26  ALT 121* 91*  --  54*  ALKPHOS 97 99  --  90  BILITOT 1.1 1.0  --  1.1  GFRNONAA 41* 37* 31* 37*  ANIONGAP 10 7 11  7    Lipids No results for input(s): "CHOL", "TRIG", "HDL", "LABVLDL", "LDLCALC", "CHOLHDL" in the last 168 hours.  Hematology Recent Labs  Lab 10/22/21 0532 10/23/21 0334 10/24/21 0439  WBC 6.8 5.7 5.5  RBC 4.42 4.11* 4.14*  HGB 11.2* 10.4* 10.7*  HCT 37.2* 33.9* 33.7*  MCV 84.2 82.5 81.4  MCH 25.3* 25.3* 25.8*  MCHC 30.1 30.7 31.8  RDW 22.2* 21.7* 21.2*  PLT 204 186 187   Thyroid No results for input(s): "TSH", "FREET4" in the last 168 hours.  BNP Recent Labs  Lab 10/20/21 1132  BNP >4,500.0*    DDimer No results for input(s): "DDIMER" in the last 168 hours.   Radiology    No results found.  Cardiac Studies   10/21/2021 TTE   IMPRESSIONS     1. LV function is depressed with diffuse hypokinesis in the lateral and  inferiror walls Compared to echo from 2019, LVEF is worse. . Left  ventricular ejection fraction, by estimation, is 30%. The left ventricle  has moderately decreased function. The  left ventricular internal cavity size was severely dilated. There is mild  left ventricular hypertrophy. Left  ventricular diastolic parameters are  consistent with Grade III diastolic dysfunction (restrictive). Elevated  left atrial pressure.   2. Right ventricular systolic function is low normal. The right  ventricular size is normal. There is normal pulmonary artery systolic  pressure.   3. Left atrial size was severely dilated.   4. Right atrial size was moderately dilated.   5. A small pericardial effusion is present. The pericardial effusion is  circumferential.   6. Mild mitral valve regurgitation.   7. The aortic valve is tricuspid. Aortic valve regurgitation is not  visualized. Aortic valve sclerosis is present, with no evidence of aortic  valve stenosis.   8. The inferior vena cava dilated.   FINDINGS   Left Ventricle: LV function is depressed with diffuse hypokinesis in the  lateral and inferiror walls Compared to echo from 2019, LVEF is worse.  Left ventricular ejection fraction, by estimation, is 30%. The left  ventricle has moderately decreased  function. The left ventricular internal cavity size was severely dilated.  There is mild left ventricular hypertrophy. Left ventricular diastolic  parameters are consistent with Grade III diastolic dysfunction  (restrictive). Elevated left atrial pressure.   Right Ventricle: The right ventricular size is normal. Right vetricular  wall thickness was not assessed. Right ventricular systolic function is  low normal. There is normal pulmonary artery systolic pressure. The  tricuspid regurgitant velocity is 1.60  m/s, and with an assumed right atrial pressure of 8 mmHg, the estimated  right ventricular systolic pressure is 86.7 mmHg.   Left Atrium: Left atrial size was severely dilated.   Right Atrium: Right atrial size was moderately dilated.   Pericardium: A small pericardial effusion is present. The pericardial  effusion is circumferential.   Mitral Valve: There is mild thickening of the mitral valve leaflet(s).  Mild mitral  annular calcification. Mild mitral valve regurgitation. MV  peak gradient, 6.9 mmHg. The mean mitral valve gradient is 2.0 mmHg.   Tricuspid Valve: The tricuspid valve is normal in structure. Tricuspid  valve regurgitation is mild.   Aortic Valve: The aortic valve is tricuspid. Aortic valve regurgitation is  not visualized. Aortic valve sclerosis is present, with no evidence of  aortic valve stenosis. Aortic valve mean gradient measures 3.0 mmHg.  Aortic valve peak gradient measures 7.2   mmHg. Aortic valve area, by VTI  measures 2.65 cm.   Pulmonic Valve: The pulmonic valve was normal in structure. Pulmonic valve  regurgitation is not visualized.   Aorta: The aortic root and ascending aorta are structurally normal, with  no evidence of dilitation.   Venous: The inferior vena cava dilated.   IAS/Shunts: No atrial level shunt detected by color flow Doppler.      10/22/2021 RHC/LHC       Mid RCA lesion is 70% stenosed.   Acute Mrg lesion is 80% stenosed.   1.  Mild to moderate obstructive coronary artery disease that is outweighed by the patient's degree of LV dysfunction.  There is a significant bifurcation lesion of the right coronary artery and large RV marginal branch.  This is moderately calcified and relatively tortuous.  Given the patient's lack of chest pain, medical therapy should be pursued. 2.  Cardiac output of 6.3 L/min, cardiac index of 3.3 L/min/m, mean RA pressure of 16 mmHg, mean wedge pressure of 21 mmHg, and LVEDP of 21 mmHg.     Diagnostic Dominance: Right   Patient Profile     66 y.o. male w/PMH of HFmrEF (EF 45-50% by echo in 12/2017 with NST showing scar and mild ischemia), carotid artery stenosis (s/p right TCAR in 01/2021), iron deficiency anemia, HTN, HLD and Stage 3 CKD who is currently admitted for hypertensive urgency and an acute CHF exacerbation. Had recent removal of a colonic mass in 07/2021.   Assessment & Plan    Coronary Artery  Disease Hyperlipidemia   Patient with elevated troponin on admission that has subsequently downtrended: 450, 418, 291. He was transferred to [facility] for RHC/LHC which found 70% stenosis of mid RCA and 80% acute marginal stenosis. Medical management recommended.    Contine DAPT with Asprin and Plavix (consider duration of 1 year). Continue Imdur for angina management. Patient remains chest pain free. LFTs much improved today. Resume nightly high intensity statin, Atorvastatin '80mg'$ .     Acute on chronic systolic and diastolic HF (EF 32%) Hypertensive Urgency   Patient with history of HFmrEF admitted with acute heart failure, BNP >4500. Repeat echo found decreased LVEF of 30%. RHC with RA 16, wedge of 21, LVEDP 21, CI 3.3. Patient has diuresed well with IV lasix and continues to appear relatively euvolemic. No peripheral edema, no significant JVD on exam.   Continue Coreg 12.'5mg'$  BID (dose reduced 9/24 due to bradycardia) Creatinine remains elevated 1.97 today. Consider ACEi/ARB/ARNI and MRA upon stabilized/improved renal function Plan to resume oral lasix today, '40mg'$  Given that patient's reduced EF does not correlate with LHC findings, would strongly consider cardiac MRI to further assess. This could be completed outpatient.   Carotid Artery Stenosis   Continue ASA and Plavix. Patient is followed by vascular surgery and is s/p TCAR on 01/2021.    Transaminitis  Lab Results  Component Value Date   ALT 54 (H) 10/26/2021   AST 26 10/26/2021   ALKPHOS 90 10/26/2021   BILITOT 1.1 10/26/2021   Plan to resume statin today.  Stage III CKD   Patient with a baseline creatinine of around 1.6-1.8. Today creatinine is elevated to 1.97. Lasix '40mg'$  PO today. Recommend repeat BMET in  1 week  [place] will sign off.   Medication Recommendations: Amlodipine 5 mg daily, aspirin 81 mg daily, Lipitor 80 mg daily, Coreg 12.5 mg twice daily, Plavix 75 mg daily, Lasix 40 mg daily, Imdur 30 mg  daily.  Potassium supplement per primary team Other recommendations (labs, testing, etc):  Check BMET/mag in 1 week. Monitor daily weights, call if gains more than 3 pounds in 1 day or 5 pounds in 1 week Follow up as an outpatient: Will schedule    For questions or updates, please contact [place] Please consult [email or web address] for contact info under        Signed, Lily Kocher, PA-C  10/26/2021, 7:45 AM     Patient seen and examined.  Agree with above documentation.  On exam, patient is alert and oriented, regular rate and rhythm, no murmurs, lungs CTAB, no LE edema or JVD.  NSVT x 11 beats overnight.  Low K+, would maintain K>4, mag >2.  Transition to p.o. Lasix 40 mg daily today.  Will need potassium supplement on discharge. Okay for discharge from cardiac standpoint.  Recommend BMET/magnesium in 1 week.  Will arrange follow-up  Donato Heinz, MD\

## 2021-10-26 NOTE — TOC Initial Note (Signed)
Transition of Care [facility]) - Initial/Assessment Note    Patient Details  Name: Michael [name] MRN: [id number] Date of Birth: May 31, 1955  Transition of Care [facility]) CM/SW Contact:    Bethena Roys, RN Phone Number: 10/26/2021, 2:58 PM  Clinical Narrative: Risk for readmission assessment completed. PTA patient was from home in a mobile home. Patient has neighbors that assist him when needed. He uses [employer] for transportation to appointments and getting his medications. Patient is agreeable to home health services. Patient did not have a preference for agency. Case Manager called [facility] and they can service the patient. Start of care to begin within 24-48 hours post transition home. CSW is securing transportation home. MD has placed orders for [facility] PT, RN for medication and disease management, and CSW for community resources. No further needs identified at this time.            Expected Discharge Plan: [place] Barriers to Discharge: No Barriers Identified   Patient Goals and CMS Choice Patient states their goals for this hospitalization and ongoing recovery are:: to return home CMS Medicare.gov Compare Post Acute Care list provided to:: Patient Choice offered to / list presented to : Patient  Expected Discharge Plan and Services Expected Discharge Plan: [place] In-house Referral: Clinical Social Work Discharge Planning Services: CM Consult Post Acute Care Choice: [place] arrangements for the past 2 months: Mobile Home Expected Discharge Date: 10/26/21               DME Arranged: N/A DME Agency: NA       HH Arranged: RN, Disease Management, PT, Social Work [employer] Agency: [place] Date HH Agency Contacted: 10/26/21 Time HH Agency Contacted: 31 Representative spoke with at [place]: [place] Arrangements/Services Living arrangements for the past 2 months: Mobile Home Lives with:: Self Patient  language and need for interpreter reviewed:: Yes        Need for Family Participation in Patient Care: Yes (Comment) Care giver support system in place?: Yes (comment)   Criminal Activity/Legal Involvement Pertinent to Current Situation/Hospitalization: No - Comment as needed  Activities of Daily Living Home Assistive Devices/Equipment: None ADL Screening (condition at time of admission) Patient's cognitive ability adequate to safely complete daily activities?: No Is the patient deaf or have difficulty hearing?: No Does the patient have difficulty seeing, even when wearing glasses/contacts?: No Does the patient have difficulty concentrating, remembering, or making decisions?: No Patient able to express need for assistance with ADLs?: No Does the patient have difficulty dressing or bathing?: No Independently performs ADLs?: Yes (appropriate for developmental age) Does the patient have difficulty walking or climbing stairs?: No Weakness of Legs: None Weakness of Arms/Hands: None  Permission Sought/Granted Permission sought to share information with : Case Manager, Customer service manager, Other (comment) (neighbors assist the patient as needed.) Permission granted to share information with : Yes, Verbal Permission Granted     Permission granted to share info w AGENCY: [facility]        Emotional Assessment Appearance:: Appears stated age     Orientation: : Oriented to Self Alcohol / Substance Use: Not Applicable Psych Involvement: No (comment)  Admission diagnosis:  NSTEMI (non-ST elevated myocardial infarction) ([place]) [I21.4] Acute congestive heart failure, unspecified heart failure type [facility]) [I50.9] Patient Active Problem List   Diagnosis Date Noted   Hypertensive urgency    Transaminitis 10/23/2021   Pressure injury of skin 10/22/2021   Type  2 MI (myocardial infarction) ([place]) 10/20/2021   Dilation of pancreatic duct 08/11/2021   Colonic mass 08/06/2021    Preoperative cardiovascular examination 08/03/2021   CAD (coronary artery disease) 08/03/2021   Stage 3a chronic kidney disease ([place]) 08/03/2021   Aortic atherosclerosis ([place]) 08/03/2021   Rectal bleeding 06/16/2021   H/O adenomatous polyp of colon 06/16/2021   Carotid stenosis, asymptomatic, right 02/15/2021   Basal cell carcinoma of [place], right 12/06/2019   Incisional hernia, without obstruction or gangrene 11/15/2019   Elevated PSA 06/19/2019   Benign prostatic hyperplasia with urinary obstruction 10/29/2018   CAP (community acquired pneumonia) 05/09/2018   Acute on chronic combined systolic and diastolic CHF (congestive heart failure) ([place]) 05/09/2018   Lobar pneumonia ([place]) 05/09/2018   Urinary retention 11/27/2017   Abnormal nuclear stress test 20/80/2233   Complicated UTI (urinary tract infection)    Sepsis ([place]) 09/17/2017   Vasovagal syncope 09/17/2017   Sepsis secondary to UTI ([place]) 09/01/2017   Syncope 07/05/2017   COPD exacerbation ([place]) 07/05/2017   Acute renal failure superimposed on chronic kidney disease ([place]) 07/05/2017   Catheter-associated urinary tract infection ([place]) 07/05/2017   Leukocytosis 07/05/2017   Urinary retention with incomplete bladder emptying 07/05/2017   UTI (urinary tract infection) 07/05/2017   Diarrhea 07/05/2017   Carotid artery disease ([place]) 06/25/2017   AKI (acute kidney injury) ([place])    Orthostatic syncope    Syncope due to orthostatic hypotension 06/24/2017   Heart murmur 06/24/2017   Abnormal EKG 06/24/2017   Abnormal PET scan of colon 05/25/2017   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 05/15/2017   Periapical abscess 04/04/2017   Iron (Fe) deficiency anemia 04/04/2017   Essential hypertension 04/02/2017   Anemia 04/02/2017   Acute hypoxemic respiratory failure ([place]) 04/02/2017   Tobacco abuse 04/02/2017   HFmrEF (heart failure with mildly reduced EF) 04/02/2017   Elevated troponin 04/02/2017   Thrombocytosis  04/02/2017   Skin lesion of face    Nasal lesion    PCP:  [name], MD Pharmacy:   [facility], [address] [place] [address] Phone: [phone] Fax: [phone]  [facility] [address] [place] [address] Phone: [phone] Fax: [phone]  Readmission Risk Interventions    10/26/2021    2:57 PM 02/18/2021   11:33 AM  Readmission Risk Prevention Plan  Transportation Screening Complete Complete  PCP or Specialist Appt within 5-7 Days  Complete  PCP or Specialist Appt within 3-5 Days Complete   Home Care Screening  Complete  Medication Review (RN CM)  Complete  HRI or Home Care Consult Complete   Social Work Consult for [place] Planning/Counseling Complete   Palliative Care Screening Not Applicable   Medication Review Press photographer) Complete

## 2021-10-26 NOTE — Progress Notes (Signed)
Patient with continued confusion this AM. MD notified, states PT will see patient prior to DC and give recommendations. PT stated patient will need HH. Home health set up, MD states pt is stable for DC. Attempted to call patient's son and friend, craig. Neither person answered. Patient states there is no need to notify anyone. Cab voucher at front desk. Will call for transport.

## 2021-10-26 NOTE — Progress Notes (Signed)
Mobility Specialist - Progress Note   10/26/21 1046  Mobility  Activity Ambulated with assistance in hallway  Level of Assistance Standby assist, set-up cues, supervision of patient - no hands on  Assistive Device None  Distance Ambulated (ft) 160 ft  Activity Response Tolerated well  $Mobility charge 1 Mobility   Pt was received in bed and agreeable to mobility. No complaints throughout ambulation. Pt was returned to bed with all needs met and bed alarm on.  Michael [name]  Mobility Specialist  

## 2021-10-26 NOTE — TOC Transition Note (Signed)
Transition of Care [facility]) - CM/SW Discharge Note   Patient Details  Name: Michael [name] MRN: [id number] Date of Birth: [birth year]  Transition [facility]) CM/SW Contact:  Milas Gain, [place] Phone Number: 10/26/2021, 2:37 PM   Clinical Narrative:     Patient will DC to: Home address-[address] [place] [address]  Anticipated DC date: 10/26/2021  Family notified: Patient declined   Transport by: [employer]   ?  Per MD patient ready for DC to home address . RN, and patient notified of DC. [employer] transport requested for patient.  CSW signing off.   Final next level of care: [place] Barriers to Discharge: No Barriers Identified   Patient Goals and CMS Choice Patient states their goals for this hospitalization and ongoing recovery are:: to return home CMS Medicare.gov Compare Post Acute Care list provided to:: Patient Choice offered to / list presented to : Patient  Discharge Placement                       Discharge Plan and Services In-house Referral: Clinical Social Work Discharge Planning Services: CM Consult Post Acute Care Choice: Home Health          DME Arranged: N/A DME Agency: NA       HH Arranged: RN, Disease Management, PT, Social Work [employer] Agency: [place] Date [place] Agency Contacted: 10/26/21 Time [place]: 6659 Representative spoke with at [place]: [place] ([place]) Interventions     Readmission Risk Interventions    02/18/2021   11:33 AM  Readmission Risk Prevention Plan  Transportation Screening Complete  PCP or Specialist Appt within 5-7 Days Complete  Home Care Screening Complete  Medication Review (RN CM) Complete

## 2021-10-26 NOTE — Progress Notes (Signed)
Physical Therapy Treatment Patient Details Name: Michael [name] MRN: [id number] DOB: 04-11-1955 Today's Date: 10/26/2021   History of Present Illness Pt is a 66 y.o. admitted 10/20/21 with hypertensive urgency, CHF exacerbation. Cardiac cath 9/22 showed mild to moderate nonobstructive disease; plan for medical management. PMH includes HF, carotid artery stenosis (s/p R TCAR 01/2021), anemia, HTN, HLD, CKD 3, COPD, colonic mass removal (07/2021), basal cell carcinoma excision.    PT Comments    Pt received in bed, oriented to self but not to time, stated that it was 1923, despite RN reorienting him numerous times this AM. Pt also unable to state what he would do at home in an emergency without cues. Pt aware that he is not thinking clearly. Pt also demonstrates balance deficits and increased reaction times with gait perturbation. Changing rec to HHPT for home safety eval and balance program.  PT will continue to follow.   Recommendations for follow up therapy are one component of a multi-disciplinary discharge planning process, led by the attending physician.  Recommendations may be updated based on patient status, additional functional criteria and insurance authorization.  Follow Up Recommendations  Home health PT     Assistance Recommended at Discharge PRN  Patient can return home with the following Assist for transportation   Equipment Recommendations  None recommended by PT    Recommendations for Other Services       Precautions / Restrictions Precautions Precautions: Fall;Other (comment) Precaution Comments: bowel incontinence Restrictions Weight Bearing Restrictions: No     Mobility  Bed Mobility Overal bed mobility: Independent                  Transfers Overall transfer level: Independent Equipment used: None                    Ambulation/Gait Ambulation/Gait assistance: Supervision, Min guard Gait Distance (Feet): 300 Feet Assistive device:  None Gait Pattern/deviations: Step-through pattern, Decreased stride length, Trunk flexed Gait velocity: Decreased Gait velocity interpretation: 1.31 - 2.62 ft/sec, indicative of limited community ambulator   General Gait Details: pt with decreased stride and forward shoulders and with decreased ability to correct for imbalance in appropriate timing. Pt kicked a chair when rounding a corner, threw him off balance. Continues to have high fall risk   Stairs             Wheelchair Mobility    Modified Rankin (Stroke Patients Only)       Balance Overall balance assessment: Needs assistance Sitting-balance support: Feet supported, No upper extremity supported Sitting balance-Leahy Scale: Good     Standing balance support: No upper extremity supported, During functional activity Standing balance-Leahy Scale: Fair Standing balance comment: increased reaction time noted with perturbation                            Cognition Arousal/Alertness: Awake/alert Behavior During Therapy: WFL for tasks assessed/performed Overall Cognitive Status: No family/caregiver present to determine baseline cognitive functioning                                 General Comments: Pt with STM deficits and decreased attention as well as delayed processing. He is somewhat aware of these deficits and states that he is not thinking clearly. Pt able to describe home situation and pathfind back to room. But unable to state what he would do at home in  an emergency without cues.        Exercises      General Comments General comments (skin integrity, edema, etc.): SPO2 90's on RA, HR in 50's      Pertinent Vitals/Pain Pain Assessment Pain Assessment: No/denies pain    Home Living                          Prior Function            PT Goals (current goals can now be found in the care plan section) Acute Rehab PT Goals Patient Stated Goal: return home PT Goal  Formulation: With patient Time For Goal Achievement: 11/06/21 Potential to Achieve Goals: Good Progress towards PT goals: Progressing toward goals    Frequency    Min 3X/week      PT Plan Discharge plan needs to be updated    Co-evaluation              AM-PAC PT "6 Clicks" Mobility   Outcome Measure  Help needed turning from your back to your side while in a flat bed without using bedrails?: None Help needed moving from lying on your back to sitting on the side of a flat bed without using bedrails?: None Help needed moving to and from a bed to a chair (including a wheelchair)?: None Help needed standing up from a chair using your arms (e.g., wheelchair or bedside chair)?: None Help needed to walk in hospital room?: A Little Help needed climbing 3-5 steps with a railing? : A Little 6 Click Score: 22    End of Session Equipment Utilized During Treatment: Gait belt Activity Tolerance: Patient tolerated treatment well Patient left: in chair;with call bell/phone within reach Nurse Communication: Mobility status PT Visit Diagnosis: Other abnormalities of gait and mobility (R26.89)     Time: 2023-3435 PT Time Calculation (min) (ACUTE ONLY): 26 min  Charges:  $Gait Training: 23-37 mins                     Leighton Roach, PT  Acute Rehab Services Secure chat preferred Office [facility] [facility] 10/26/2021, 2:16 PM

## 2021-10-26 NOTE — Discharge Summary (Signed)
Physician Discharge Summary  Michael [name] OYD:[id number] DOB: 1955/06/11 DOA: 10/20/2021  PCP: [name], MD  Admit date: 10/20/2021 Discharge date: 10/26/2021  Admitted From: Home Disposition: Home  Recommendations for Outpatient Follow-up:  Follow up with PCP in 1 week with repeat CBC/BMP Outpatient follow-up with cardiology Follow up in ED if symptoms worsen or new appear   Home Health: No Equipment/Devices: None  Discharge Condition: Stable CODE STATUS: Full Diet recommendation: Heart healthy/fluid restriction of up to 1200 cc a day  Brief/Interim Summary: 66 y.o. male with medical history significant for carotid artery disease, COPD, CAD, CKD stage IIIb, hypertension, prior head and neck cancer, and chronic diastolic/systolic CHF who presented with complaints of dyspnea which has been intermittent over the past 2 weeks since his surgery.  Patient was admitted with hypertensive urgency and acute on chronic systolic/diastolic heart failure exacerbation with noted elevated troponin.  He was subsequently found to have an EF of 30% and cardiology recommended transfer to [facility] for cardiac catheterization.  Cardiac catheterization showed mild to moderate nonobstructive disease.  Cardiology recommended medical management.  Cardiology has adjusted doses of some of his medications.  Cardiology has cleared the patient for discharge home today.  Discharge home today.  Outpatient follow-up with PCP and cardiology.  Discharge Diagnoses:   Acute on chronic systolic/diastolic heart failure -Echo showed EF of 30% -Patient transferred from [facility] to [facility] for cardiac catheterization. -Status post cardiac catheterization on 10/22/2021 which showed mild to moderate nonobstructive disease: Recommended medical management -Cardiology following.  Lasix was held on 10/25/2021 by cardiology.  Subsequently, Lasix has been resumed today by cardiology.  Cardiology has  cleared the patient for discharge.  Discharge patient home on Lasix 40 mg daily, Imdur 30 mg daily, Coreg 12.5 mg twice daily.  Continue diet and fluid restriction.   Has had good diuresis since admission. -Outpatient follow-up with cardiology.   Elevated troponins -Possibly from above.  Continue aspirin, Plavix, Coreg and Lipitor.   Acute delirium/acute metabolic encephalopathy -Improving.  Monitor mental status.   Hypertensive urgency: Resolved -Continue Coreg,  Imdur, Lasix and amlodipine   Acute kidney injury on chronic kidney disease stage IIIb -Baseline creatinine 1.6-1.8.  Creatinine slightly increased to 2.25 on 10/25/2021.  Lasix held.  Creatinine 1.97 today.  Baseline as above.  Will need outpatient BMP follow-up.    Acute transaminitis -Improving.  Outpatient follow-up.   Anemia of chronic disease -Possibly from kidney disease.  Hemoglobin stable.  Monitor intermittently as an outpatient  Hypokalemia -Replace prior to discharge.  Will need continued replacement as an outpatient.  Outpatient follow-up of BMP   hypomagnesemia -Improved   History of COPD -Currently stable.  Continue nebs as needed   Right colonic mass removal in 07/2021 -High-grade dysplasia noted on pathology report with no invasive carcinoma   Carotid artery disease -Prior right carotid severe stenosis with TCAR   Dyslipidemia -Currently on Repatha with LDL 64   Prior head neck cancer with resection -Outpatient follow-up with ENT    Stage I pressure injury in the perineal region: Present on admission -Agree with the documentation below.  Continue local wound care Pressure Injury 10/20/21 Perineum Mid Stage 1 -  Intact skin with non-blanchable redness of a localized area usually over a bony prominence. Deeply reddened.  Non-blanchable and raw throughout perineum (Active)  10/20/21 1612  Location: Perineum  Location Orientation: Mid  Staging: Stage 1 -  Intact skin with non-blanchable redness of  a localized area  usually over a bony prominence.  Wound Description (Comments): Deeply reddened.  Non-blanchable and raw throughout perineum  Present on Admission: Yes       Discharge Instructions  Discharge Instructions     Ambulatory referral to Cardiology   Complete by: As directed    Diet - low sodium heart healthy   Complete by: As directed    Increase activity slowly   Complete by: As directed    No wound care   Complete by: As directed       Allergies as of 10/26/2021   No Known Allergies      Medication List     STOP taking these medications    metoprolol succinate 25 MG 24 hr tablet Commonly known as: TOPROL-XL   ondansetron 4 MG tablet Commonly known as: ZOFRAN   potassium chloride 10 MEQ tablet Commonly known as: KLOR-CON   trimethoprim 100 MG tablet Commonly known as: TRIMPEX       TAKE these medications    albuterol 108 (90 Base) MCG/ACT inhaler Commonly known as: VENTOLIN HFA Inhale 2 puffs into the lungs every 6 (six) hours as needed for wheezing or shortness of breath.   amLODipine 5 MG tablet Commonly known as: NORVASC Take 5 mg by mouth daily.   aspirin EC 81 MG tablet Take 81 mg by mouth daily.   atorvastatin 80 MG tablet Commonly known as: LIPITOR Take 1 tablet (80 mg total) by mouth every evening.   carvedilol 12.5 MG tablet Commonly known as: COREG Take 1 tablet (12.5 mg total) by mouth 2 (two) times daily with a meal.   clopidogrel 75 MG tablet Commonly known as: PLAVIX Take 1 tablet (75 mg total) by mouth daily.   ergocalciferol 1.25 MG (50000 UT) capsule Commonly known as: VITAMIN D2 Take 1 capsule (50,000 Units total) by mouth once a week.   furosemide 40 MG tablet Commonly known as: LASIX Take 1 tablet (40 mg total) by mouth daily. What changed: See the new instructions.   isosorbide mononitrate 30 MG 24 hr tablet Commonly known as: IMDUR Take 1 tablet (30 mg total) by mouth every evening.   oxybutynin 5 MG  tablet Commonly known as: DITROPAN 1 tablet Orally three times a day   potassium chloride SA 20 MEQ tablet Commonly known as: KLOR-CON M Take 1 tablet (20 mEq total) by mouth 2 (two) times daily.   Repatha SureClick 741 MG/ML Soaj Generic drug: Evolocumab Inject 1 pen into the skin every 14 (fourteen) days.        Follow-up Information     [name], MD. Schedule an appointment as soon as possible for a visit in 1 week(s).   Specialty: Internal Medicine Why: With repeat BMP/magnesium Contact information: [place] [facility] [address] [phone]         Satira Sark, MD .   Specialty: Cardiology Contact information: [place] [address] [phone]                No Known Allergies  Consultations: Cardiology   Procedures/Studies: CARDIAC CATHETERIZATION  Result Date: 10/22/2021   Mid RCA lesion is 70% stenosed.   Acute Mrg lesion is 80% stenosed. 1.  Mild to moderate obstructive coronary artery disease that is outweighed by the patient's degree of LV dysfunction.  There is a significant bifurcation lesion of the right coronary artery and large RV marginal branch.  This is moderately calcified and relatively tortuous.  Given the  patient's lack of chest pain, medical therapy should be pursued. 2.  Cardiac output of 6.3 L/min, cardiac index of 3.3 L/min/m, mean RA pressure of 16 mmHg, mean wedge pressure of 21 mmHg, and LVEDP of 21 mmHg. Recommendation: Goal-directed medical therapy with augmentation of diuretics; consider dual antiplatelet therapy for 1 year for medical treatment of acute coronary syndrome.   ECHOCARDIOGRAM COMPLETE  Result Date: 10/21/2021    ECHOCARDIOGRAM REPORT   [name] Date of Exam: 10/21/2021 Medical Rec #:  [id number]     Height:       72.0 in Accession #:    [id number]    Weight:       167.0 lb Date of Birth:  Apr 22, 1955     BSA:          1.973 m Patient Age:    28 years       BP:           136/54 mmHg Patient Gender: M             HR:           62 bpm. Exam Location:  [name] Procedure: 2D Echo, Cardiac Doppler and Color Doppler Indications:    Dyspnea  History:        Patient has prior history of Echocardiogram examinations, most                 recent 12/05/2017. CHF, CAD, Abnormal ECG, COPD,                 Signs/Symptoms:Murmur and Syncope; Risk Factors:Hypertension,                 Diabetes and Current Smoker.  Sonographer:    Wenda Low Referring Phys: [id number] [facility] IMPRESSIONS  1. LV function is depressed with diffuse hypokinesis in the lateral and inferiror walls Compared to echo from 2019, LVEF is worse. . Left ventricular ejection fraction, by estimation, is 30%. The left ventricle has moderately decreased function. The left ventricular internal cavity size was severely dilated. There is mild left ventricular hypertrophy. Left ventricular diastolic parameters are consistent with Grade III diastolic dysfunction (restrictive). Elevated left atrial pressure.  2. Right ventricular systolic function is low normal. The right ventricular size is normal. There is normal pulmonary artery systolic pressure.  3. Left atrial size was severely dilated.  4. Right atrial size was moderately dilated.  5. A small pericardial effusion is present. The pericardial effusion is circumferential.  6. Mild mitral valve regurgitation.  7. The aortic valve is tricuspid. Aortic valve regurgitation is not visualized. Aortic valve sclerosis is present, with no evidence of aortic valve stenosis.  8. The inferior vena cava dilated. FINDINGS  Left Ventricle: LV function is depressed with diffuse hypokinesis in the lateral and inferiror walls Compared to echo from 2019, LVEF is worse. Left ventricular ejection fraction, by estimation, is 30%. The left ventricle has moderately decreased function. The left ventricular internal cavity size was severely dilated. There is mild left ventricular  hypertrophy. Left ventricular diastolic parameters are consistent with Grade III diastolic dysfunction (restrictive). Elevated left atrial pressure. Right Ventricle: The right ventricular size is normal. Right vetricular wall thickness was not assessed. Right ventricular systolic function is low normal. There is normal pulmonary artery systolic pressure. The tricuspid regurgitant velocity is 1.60 m/s, and with an assumed right atrial pressure of 8 mmHg, the estimated right ventricular systolic pressure is 30.8 mmHg. Left Atrium: Left atrial  size was severely dilated. Right Atrium: Right atrial size was moderately dilated. Pericardium: A small pericardial effusion is present. The pericardial effusion is circumferential. Mitral Valve: There is mild thickening of the mitral valve leaflet(s). Mild mitral annular calcification. Mild mitral valve regurgitation. MV peak gradient, 6.9 mmHg. The mean mitral valve gradient is 2.0 mmHg. Tricuspid Valve: The tricuspid valve is normal in structure. Tricuspid valve regurgitation is mild. Aortic Valve: The aortic valve is tricuspid. Aortic valve regurgitation is not visualized. Aortic valve sclerosis is present, with no evidence of aortic valve stenosis. Aortic valve mean gradient measures 3.0 mmHg. Aortic valve peak gradient measures 7.2  mmHg. Aortic valve area, by VTI measures 2.65 cm. Pulmonic Valve: The pulmonic valve was normal in structure. Pulmonic valve regurgitation is not visualized. Aorta: The aortic root and ascending aorta are structurally normal, with no evidence of dilitation. Venous: The inferior vena cava dilated. IAS/Shunts: No atrial level shunt detected by color flow Doppler.  LEFT VENTRICLE PLAX 2D LVIDd:         6.50 cm      Diastology LVIDs:         5.60 cm      LV e' medial:   3.09 cm/s LV PW:         1.10 cm      LV E/e' medial: 37.2 LV IVS:        1.20 cm LVOT diam:     2.40 cm LV SV:         72 LV SV Index:   37 LVOT Area:     4.52 cm  LV Volumes  (MOD) LV vol d, MOD A2C: 182.0 ml LV vol d, MOD A4C: 179.0 ml LV vol s, MOD A2C: 132.0 ml LV vol s, MOD A4C: 125.0 ml LV SV MOD A2C:     50.0 ml LV SV MOD A4C:     179.0 ml LV SV MOD BP:      49.6 ml RIGHT VENTRICLE RV Basal diam:  5.50 cm RV Mid diam:    3.70 cm RV S prime:     7.62 cm/s TAPSE (M-mode): 2.2 cm LEFT ATRIUM              Index        RIGHT ATRIUM           Index LA diam:        5.40 cm  2.74 cm/m   RA Area:     26.00 cm LA Vol (A2C):   144.0 ml 72.97 ml/m  RA Volume:   94.10 ml  47.68 ml/m LA Vol (A4C):   132.0 ml 66.89 ml/m LA Biplane Vol: 143.0 ml 72.46 ml/m  AORTIC VALVE                    PULMONIC VALVE AV Area (Vmax):    3.41 cm     PV Vmax:       0.81 m/s AV Area (Vmean):   2.91 cm     PV Peak grad:  2.7 mmHg AV Area (VTI):     2.65 cm AV Vmax:           134.00 cm/s AV Vmean:          82.700 cm/s AV VTI:            0.273 m AV Peak Grad:      7.2 mmHg AV Mean Grad:      3.0 mmHg LVOT Vmax:  101.00 cm/s LVOT Vmean:        53.200 cm/s LVOT VTI:          0.160 m LVOT/AV VTI ratio: 0.59  AORTA Ao Root diam: 3.80 cm Ao Asc diam:  3.40 cm MITRAL VALVE                TRICUSPID VALVE MV Area (PHT): 3.58 cm     TR Peak grad:   10.2 mmHg MV Area VTI:   1.86 cm     TR Vmax:        160.00 cm/s MV Peak grad:  6.9 mmHg MV Mean grad:  2.0 mmHg     SHUNTS MV Vmax:       1.31 m/s     Systemic VTI:  0.16 m MV Vmean:      55.6 cm/s    Systemic Diam: 2.40 cm MV Decel Time: 212 msec MV E velocity: 115.00 cm/s MV A velocity: 36.80 cm/s MV E/A ratio:  3.13 Dorris Carnes MD Electronically signed by Dorris Carnes MD Signature Date/Time: 10/21/2021/2:57:36 PM    Final    CT Abdomen Pelvis Wo Contrast  Result Date: 10/20/2021 CLINICAL DATA:  Colorectal carcinoma. Elevated liver function. Metastatic disease evaluation. * Tracking Code: BO * EXAM: CT ABDOMEN AND PELVIS WITHOUT CONTRAST TECHNIQUE: Multidetector CT imaging of the abdomen and pelvis was performed following the standard protocol without IV  contrast. RADIATION DOSE REDUCTION: This exam was performed according to the departmental dose-optimization program which includes automated exposure control, adjustment of the mA and/or kV according to patient size and/or use of iterative reconstruction technique. COMPARISON:  CT 07/05/2021, MRI 08/06/2021 FINDINGS: Lower chest: Moderate RIGHT pleural effusion and small LEFT pleural effusion. Hepatobiliary: No suspicious lesion within liver with limitations of noncontrast exam. Simple cyst within the central liver unchanged from comparison CT. Large periphery calcified gallstone within the lumen the gallbladder. No acute inflammation. Pancreas: Coarse calcifications through the distal pancreas leading up to the head of the pancreas. Stable duct dilatation in tail the pancreas. No change from comparison MRI or CT. Spleen: Normal spleen Adrenals/urinary tract: Adrenal glands and kidneys are normal. The ureters and bladder normal. Stomach/Bowel: Stomach, duodenum small-bowel normal. Post RIGHT hemicolectomy. LEFT colon normal. Vascular/Lymphatic: Calcification abdominal aorta. Fusiform dilatation of the for abdominal aorta to 3.1 cm. Reproductive: TURP defect within the prostate Other: Midline surgical wound with mild thickening at the skin surface measuring 13 mm. Musculoskeletal: No aggressive osseous lesion. IMPRESSION: 1. New bilateral small moderate pleural effusions, RIGHT greater than LEFT. 2. No evidence of hepatic metastasis on noncontrast CT. 3. Large gallstone without evidence acute cholecystitis. 4. Post RIGHT hemicolectomy without complicating features. 5. Thickening tissue at the skin surface along the ventral abdominal wall is likely scar tissue. 6. 3.1 cm abdominal aortic aneurysm. Recommend follow-up every 3 years. Reference: J Am Coll Radiol 0981;19:147-829. Electronically Signed   By: Suzy Bouchard M.D.   On: 10/20/2021 14:17   DG Chest Port 1 View  Result Date: 10/20/2021 CLINICAL DATA:   Shortness of breath EXAM: PORTABLE CHEST 1 VIEW COMPARISON:  08/13/2021 FINDINGS: Stable cardiomediastinal contours. Pulmonary vascular congestion with diffusely prominent interstitial markings bilaterally. No appreciable pleural fluid collection. No pneumothorax. IMPRESSION: Findings of CHF with interstitial edema. Electronically Signed   By: Davina Poke D.O.   On: 10/20/2021 11:02      Subjective: Patient seen and examined at bedside.  No fever, vomiting, chest pain or worsening shortness of breath reported.  Discharge Exam: Vitals:  10/26/21 0422 10/26/21 0858  BP: 134/60   Pulse:    Resp: 20 20  Temp: 99 F (37.2 C)   SpO2: 96%     General: Pt is alert, awake, not in acute distress.  Looks chronically ill and deconditioned.  Currently on room air.  Looks well thinly built.  Flat affect.  Slow to respond. Cardiovascular: rate controlled, S1/S2 + Respiratory: bilateral decreased breath sounds at bases with some basilar crackles Abdominal: Soft, NT, ND, bowel sounds + Extremities: Trace lower extremity edema; no cyanosis    The results of significant diagnostics from this hospitalization (including imaging, microbiology, ancillary and laboratory) are listed below for reference.     Microbiology: Recent Results (from the past 240 hour(s))  MRSA Next Gen by PCR, Nasal     Status: None   Collection Time: 10/20/21  4:35 PM   Specimen: Nasal Mucosa; Nasal Swab  Result Value Ref Range Status   MRSA by PCR Next Gen NOT DETECTED NOT DETECTED Final    Comment: (NOTE) The GeneXpert MRSA Assay (FDA approved for NASAL specimens only), is one component of a comprehensive MRSA colonization surveillance program. It is not intended to diagnose MRSA infection nor to guide or monitor treatment for MRSA infections. Test performance is not FDA approved in patients less than 46 years old. Performed at [facility], [address], [place], [place] [address]   SARS Coronavirus 2 by RT PCR  (hospital order, performed in [facility] hospital lab) *cepheid single result test* Anterior Nasal Swab     Status: None   Collection Time: 10/21/21  9:40 AM   Specimen: Anterior Nasal Swab  Result Value Ref Range Status   SARS Coronavirus 2 by RT PCR NEGATIVE NEGATIVE Final    Comment: (NOTE) SARS-CoV-2 target nucleic acids are NOT DETECTED.  The SARS-CoV-2 RNA is generally detectable in upper and lower respiratory specimens during the acute phase of infection. The lowest concentration of SARS-CoV-2 viral copies this assay can detect is 250 copies / mL. A negative result does not preclude SARS-CoV-2 infection and should not be used as the sole basis for treatment or other patient management decisions.  A negative result may occur with improper specimen collection / handling, submission of specimen other than nasopharyngeal swab, presence of viral mutation(s) within the areas targeted by this assay, and inadequate number of viral copies (<250 copies / mL). A negative result must be combined with clinical observations, patient history, and epidemiological information.  Fact Sheet for Patients:   https://www.patel.info/  Fact Sheet for Healthcare Providers: https://hall.com/  This test is not yet approved or  cleared by the [place] FDA and has been authorized for detection and/or diagnosis of SARS-CoV-2 by FDA under an Emergency Use Authorization (EUA).  This EUA will remain in effect (meaning this test can be used) for the duration of the COVID-19 declaration under Section 564(b)(1) of the Act, 21 U.S.C. section 360bbb-3(b)(1), unless the authorization is terminated or revoked sooner.  Performed at [facility], [address], [place], [place] [address]      Labs: BNP (last 3 results) Recent Labs    10/20/21 1132  BNP >2,778.2*   Basic Metabolic Panel: Recent Labs  Lab 10/22/21 0532 10/23/21 0334 10/24/21 0439  10/25/21 0227 10/26/21 0219  NA 142 142 139 140 137  K 3.2* 3.0* 3.3* 3.8 3.1*  CL 107 105 102 99 102  CO2 '29 27 30 30 28  '$ GLUCOSE 109* 127* 113* 113* 113*  BUN 30*  26* 28* 33* 30*  CREATININE 1.90* 1.78* 1.97* 2.25* 1.97*  CALCIUM 8.2* 8.3* 8.1* 8.9 8.1*  MG 1.7 1.6* 1.9 1.8 1.9   Liver Function Tests: Recent Labs  Lab 10/20/21 1023 10/21/21 0626 10/23/21 0334 10/24/21 0439 10/26/21 0219  AST 237* 127* 41 34 26  ALT 278* 245* 121* 91* 54*  ALKPHOS 150* 111 97 99 90  BILITOT 2.1* 1.9* 1.1 1.0 1.1  PROT 6.8 5.5* 5.2* 5.5* 5.6*  ALBUMIN 3.5 2.8* 2.6* 2.7* 2.6*   No results for input(s): "LIPASE", "AMYLASE" in the last 168 hours. No results for input(s): "AMMONIA" in the last 168 hours. CBC: Recent Labs  Lab 10/20/21 1023 10/21/21 0626 10/22/21 0532 10/23/21 0334 10/24/21 0439  WBC 8.1 7.6 6.8 5.7 5.5  NEUTROABS 6.7  --   --   --  3.1  HGB 13.1 11.2* 11.2* 10.4* 10.7*  HCT 44.4 37.4* 37.2* 33.9* 33.7*  MCV 84.3 84.8 84.2 82.5 81.4  PLT 294 216 204 186 187   Cardiac Enzymes: No results for input(s): "CKTOTAL", "CKMB", "CKMBINDEX", "TROPONINI" in the last 168 hours. BNP: Invalid input(s): "POCBNP" CBG: Recent Labs  Lab 10/22/21 1625  GLUCAP 106*   D-Dimer No results for input(s): "DDIMER" in the last 72 hours. Hgb A1c No results for input(s): "HGBA1C" in the last 72 hours. Lipid Profile No results for input(s): "CHOL", "HDL", "LDLCALC", "TRIG", "CHOLHDL", "LDLDIRECT" in the last 72 hours. Thyroid function studies No results for input(s): "TSH", "T4TOTAL", "T3FREE", "THYROIDAB" in the last 72 hours.  Invalid input(s): "FREET3" Anemia work up No results for input(s): "VITAMINB12", "FOLATE", "FERRITIN", "TIBC", "IRON", "RETICCTPCT" in the last 72 hours. Urinalysis    Component Value Date/Time   COLORURINE YELLOW 02/09/2021 1348   APPEARANCEUR Cloudy (A) 06/16/2021 0936   LABSPEC 1.025 02/09/2021 1348   LABSPEC 1.018 04/23/2011 1044   PHURINE 6.0  02/09/2021 1348   GLUCOSEU Negative 06/16/2021 0936   GLUCOSEU Negative 04/23/2011 1044   HGBUR TRACE (A) 02/09/2021 1348   BILIRUBINUR Negative 06/16/2021 0936   BILIRUBINUR Negative 04/23/2011 1044   KETONESUR NEGATIVE 02/09/2021 1348   PROTEINUR Trace (A) 06/16/2021 0936   PROTEINUR NEGATIVE 02/09/2021 1348   NITRITE Positive (A) 06/16/2021 0936   NITRITE POSITIVE (A) 02/09/2021 1348   LEUKOCYTESUR 2+ (A) 06/16/2021 0936   LEUKOCYTESUR SMALL (A) 02/09/2021 1348   LEUKOCYTESUR Trace 04/23/2011 1044   Sepsis Labs Recent Labs  Lab 10/21/21 0626 10/22/21 0532 10/23/21 0334 10/24/21 0439  WBC 7.6 6.8 5.7 5.5   Microbiology Recent Results (from the past 240 hour(s))  MRSA Next Gen by PCR, Nasal     Status: None   Collection Time: 10/20/21  4:35 PM   Specimen: Nasal Mucosa; Nasal Swab  Result Value Ref Range Status   MRSA by PCR Next Gen NOT DETECTED NOT DETECTED Final    Comment: (NOTE) The GeneXpert MRSA Assay (FDA approved for NASAL specimens only), is one component of a comprehensive MRSA colonization surveillance program. It is not intended to diagnose MRSA infection nor to guide or monitor treatment for MRSA infections. Test performance is not FDA approved in patients less than 48 years old. Performed at [facility], [address] [place], [place] [address]   SARS Coronavirus 2 by RT PCR (hospital order, performed in [facility] hospital lab) *cepheid single result test* Anterior Nasal Swab     Status: None   Collection Time: 10/21/21  9:40 AM   Specimen: Anterior Nasal Swab  Result Value Ref Range Status  SARS Coronavirus 2 by RT PCR NEGATIVE NEGATIVE Final    Comment: (NOTE) SARS-CoV-2 target nucleic acids are NOT DETECTED.  The SARS-CoV-2 RNA is generally detectable in upper and lower respiratory specimens during the acute phase of infection. The lowest concentration of SARS-CoV-2 viral copies this assay can detect is 250 copies / mL. A negative result does  not preclude SARS-CoV-2 infection and should not be used as the sole basis for treatment or other patient management decisions.  A negative result may occur with improper specimen collection / handling, submission of specimen other than nasopharyngeal swab, presence of viral mutation(s) within the areas targeted by this assay, and inadequate number of viral copies (<250 copies / mL). A negative result must be combined with clinical observations, patient history, and epidemiological information.  Fact Sheet for Patients:   https://www.patel.info/  Fact Sheet for Healthcare Providers: https://hall.com/  This test is not yet approved or  cleared by the [place] FDA and has been authorized for detection and/or diagnosis of SARS-CoV-2 by FDA under an Emergency Use Authorization (EUA).  This EUA will remain in effect (meaning this test can be used) for the duration of the COVID-19 declaration under Section 564(b)(1) of the Act, 21 U.S.C. section 360bbb-3(b)(1), unless the authorization is terminated or revoked sooner.  Performed at [facility], [address], [place], [place] [address]      Time coordinating discharge: 35 minutes  SIGNED:   Aline August, MD  [facility] 10/26/2021, 11:21 AM

## 2021-11-02 ENCOUNTER — Other Ambulatory Visit (HOSPITAL_COMMUNITY): Payer: Self-pay

## 2021-11-02 ENCOUNTER — Encounter (HOSPITAL_COMMUNITY): Payer: Medicare Other

## 2021-11-02 ENCOUNTER — Telehealth (HOSPITAL_COMMUNITY): Payer: Self-pay | Admitting: *Deleted

## 2021-11-02 NOTE — Telephone Encounter (Signed)
Heart Failure Nurse Navigator Progress Note    Mailbox full, unable to leave appointment reminder message for 11 am on 11/02/2021.   Earnestine Leys, BSN, Clinical cytogeneticist Only

## 2021-11-03 DIAGNOSIS — R2689 Other abnormalities of gait and mobility: Secondary | ICD-10-CM | POA: Insufficient documentation

## 2021-11-03 DIAGNOSIS — E876 Hypokalemia: Secondary | ICD-10-CM | POA: Diagnosis not present

## 2021-11-03 DIAGNOSIS — E8809 Other disorders of plasma-protein metabolism, not elsewhere classified: Secondary | ICD-10-CM | POA: Insufficient documentation

## 2021-11-03 DIAGNOSIS — J449 Chronic obstructive pulmonary disease, unspecified: Secondary | ICD-10-CM | POA: Diagnosis present

## 2021-11-03 DIAGNOSIS — I214 Non-ST elevation (NSTEMI) myocardial infarction: Secondary | ICD-10-CM | POA: Insufficient documentation

## 2021-11-03 DIAGNOSIS — R5381 Other malaise: Secondary | ICD-10-CM | POA: Insufficient documentation

## 2021-11-03 DIAGNOSIS — R809 Proteinuria, unspecified: Secondary | ICD-10-CM | POA: Insufficient documentation

## 2021-11-03 DIAGNOSIS — I7143 Infrarenal abdominal aortic aneurysm, without rupture: Secondary | ICD-10-CM | POA: Insufficient documentation

## 2021-11-03 DIAGNOSIS — I6529 Occlusion and stenosis of unspecified carotid artery: Secondary | ICD-10-CM | POA: Diagnosis not present

## 2021-11-03 DIAGNOSIS — I5043 Acute on chronic combined systolic (congestive) and diastolic (congestive) heart failure: Secondary | ICD-10-CM | POA: Diagnosis not present

## 2021-11-03 DIAGNOSIS — Z86010 Personal history of colonic polyps: Secondary | ICD-10-CM | POA: Diagnosis not present

## 2021-11-03 DIAGNOSIS — I10 Essential (primary) hypertension: Secondary | ICD-10-CM | POA: Diagnosis not present

## 2021-11-03 DIAGNOSIS — L8990 Pressure ulcer of unspecified site, unspecified stage: Secondary | ICD-10-CM | POA: Diagnosis not present

## 2021-11-03 DIAGNOSIS — E785 Hyperlipidemia, unspecified: Secondary | ICD-10-CM | POA: Diagnosis not present

## 2021-11-03 DIAGNOSIS — E782 Mixed hyperlipidemia: Secondary | ICD-10-CM | POA: Insufficient documentation

## 2021-11-03 DIAGNOSIS — E440 Moderate protein-calorie malnutrition: Secondary | ICD-10-CM | POA: Insufficient documentation

## 2021-11-03 DIAGNOSIS — N1832 Chronic kidney disease, stage 3b: Secondary | ICD-10-CM | POA: Diagnosis not present

## 2021-11-07 ENCOUNTER — Encounter (HOSPITAL_COMMUNITY): Payer: Self-pay

## 2021-11-07 ENCOUNTER — Other Ambulatory Visit: Payer: Self-pay

## 2021-11-07 ENCOUNTER — Observation Stay (HOSPITAL_COMMUNITY)
Admission: EM | Admit: 2021-11-07 | Discharge: 2021-11-09 | Disposition: A | Discharge status: Home / Self Care | Payer: Medicare Other | Attending: Internal Medicine | Admitting: Internal Medicine

## 2021-11-07 ENCOUNTER — Emergency Department (HOSPITAL_COMMUNITY): Payer: Medicare Other

## 2021-11-07 DIAGNOSIS — R55 Syncope and collapse: Secondary | ICD-10-CM | POA: Diagnosis not present

## 2021-11-07 DIAGNOSIS — I130 Hypertensive heart and chronic kidney disease with heart failure and stage 1 through stage 4 chronic kidney disease, or unspecified chronic kidney disease: Secondary | ICD-10-CM | POA: Insufficient documentation

## 2021-11-07 DIAGNOSIS — I10 Essential (primary) hypertension: Secondary | ICD-10-CM | POA: Diagnosis present

## 2021-11-07 DIAGNOSIS — Z743 Need for continuous supervision: Secondary | ICD-10-CM | POA: Diagnosis not present

## 2021-11-07 DIAGNOSIS — I209 Angina pectoris, unspecified: Secondary | ICD-10-CM | POA: Diagnosis not present

## 2021-11-07 DIAGNOSIS — R42 Dizziness and giddiness: Secondary | ICD-10-CM | POA: Diagnosis not present

## 2021-11-07 DIAGNOSIS — R0789 Other chest pain: Secondary | ICD-10-CM | POA: Diagnosis present

## 2021-11-07 DIAGNOSIS — N1831 Chronic kidney disease, stage 3a: Secondary | ICD-10-CM | POA: Diagnosis present

## 2021-11-07 DIAGNOSIS — J441 Chronic obstructive pulmonary disease with (acute) exacerbation: Secondary | ICD-10-CM | POA: Diagnosis not present

## 2021-11-07 DIAGNOSIS — Z85828 Personal history of other malignant neoplasm of skin: Secondary | ICD-10-CM | POA: Insufficient documentation

## 2021-11-07 DIAGNOSIS — Z7982 Long term (current) use of aspirin: Secondary | ICD-10-CM | POA: Insufficient documentation

## 2021-11-07 DIAGNOSIS — I5042 Chronic combined systolic (congestive) and diastolic (congestive) heart failure: Secondary | ICD-10-CM

## 2021-11-07 DIAGNOSIS — Z79899 Other long term (current) drug therapy: Secondary | ICD-10-CM | POA: Insufficient documentation

## 2021-11-07 DIAGNOSIS — R0689 Other abnormalities of breathing: Secondary | ICD-10-CM | POA: Diagnosis not present

## 2021-11-07 DIAGNOSIS — R6889 Other general symptoms and signs: Secondary | ICD-10-CM | POA: Diagnosis not present

## 2021-11-07 DIAGNOSIS — N1832 Chronic kidney disease, stage 3b: Secondary | ICD-10-CM | POA: Diagnosis not present

## 2021-11-07 DIAGNOSIS — I2510 Atherosclerotic heart disease of native coronary artery without angina pectoris: Secondary | ICD-10-CM | POA: Diagnosis not present

## 2021-11-07 DIAGNOSIS — R079 Chest pain, unspecified: Secondary | ICD-10-CM | POA: Diagnosis not present

## 2021-11-07 DIAGNOSIS — Z87891 Personal history of nicotine dependence: Secondary | ICD-10-CM | POA: Diagnosis not present

## 2021-11-07 DIAGNOSIS — I499 Cardiac arrhythmia, unspecified: Secondary | ICD-10-CM | POA: Diagnosis not present

## 2021-11-07 DIAGNOSIS — I5022 Chronic systolic (congestive) heart failure: Secondary | ICD-10-CM | POA: Diagnosis present

## 2021-11-07 DIAGNOSIS — Z7902 Long term (current) use of antithrombotics/antiplatelets: Secondary | ICD-10-CM | POA: Diagnosis not present

## 2021-11-07 DIAGNOSIS — I779 Disorder of arteries and arterioles, unspecified: Secondary | ICD-10-CM | POA: Diagnosis present

## 2021-11-07 DIAGNOSIS — I2089 Other forms of angina pectoris: Secondary | ICD-10-CM | POA: Insufficient documentation

## 2021-11-07 DIAGNOSIS — F17200 Nicotine dependence, unspecified, uncomplicated: Secondary | ICD-10-CM | POA: Diagnosis present

## 2021-11-07 DIAGNOSIS — N183 Chronic kidney disease, stage 3 unspecified: Secondary | ICD-10-CM | POA: Diagnosis present

## 2021-11-07 DIAGNOSIS — Z720 Tobacco use: Secondary | ICD-10-CM | POA: Diagnosis present

## 2021-11-07 DIAGNOSIS — J449 Chronic obstructive pulmonary disease, unspecified: Secondary | ICD-10-CM | POA: Diagnosis present

## 2021-11-07 LAB — CBC WITH DIFFERENTIAL/PLATELET
Abs Immature Granulocytes: 0.03 10*3/uL (ref 0.00–0.07)
Basophils Absolute: 0.1 10*3/uL (ref 0.0–0.1)
Basophils Relative: 1 %
Eosinophils Absolute: 0.3 10*3/uL (ref 0.0–0.5)
Eosinophils Relative: 3 %
HCT: 33.6 % — ABNORMAL LOW (ref 39.0–52.0)
Hemoglobin: 10.3 g/dL — ABNORMAL LOW (ref 13.0–17.0)
Immature Granulocytes: 0 %
Lymphocytes Relative: 14 %
Lymphs Abs: 1.4 10*3/uL (ref 0.7–4.0)
MCH: 26.1 pg (ref 26.0–34.0)
MCHC: 30.7 g/dL (ref 30.0–36.0)
MCV: 85.3 fL (ref 80.0–100.0)
Monocytes Absolute: 0.5 10*3/uL (ref 0.1–1.0)
Monocytes Relative: 6 %
Neutro Abs: 7.2 10*3/uL (ref 1.7–7.7)
Neutrophils Relative %: 76 %
Platelets: 286 10*3/uL (ref 150–400)
RBC: 3.94 MIL/uL — ABNORMAL LOW (ref 4.22–5.81)
RDW: 20.2 % — ABNORMAL HIGH (ref 11.5–15.5)
WBC: 9.6 10*3/uL (ref 4.0–10.5)
nRBC: 0 % (ref 0.0–0.2)

## 2021-11-07 LAB — COMPREHENSIVE METABOLIC PANEL
ALT: 30 U/L (ref 0–44)
AST: 40 U/L (ref 15–41)
Albumin: 3 g/dL — ABNORMAL LOW (ref 3.5–5.0)
Alkaline Phosphatase: 143 U/L — ABNORMAL HIGH (ref 38–126)
Anion gap: 6 (ref 5–15)
BUN: 25 mg/dL — ABNORMAL HIGH (ref 8–23)
CO2: 23 mmol/L (ref 22–32)
Calcium: 8.5 mg/dL — ABNORMAL LOW (ref 8.9–10.3)
Chloride: 110 mmol/L (ref 98–111)
Creatinine, Ser: 1.56 mg/dL — ABNORMAL HIGH (ref 0.61–1.24)
GFR, Estimated: 48 mL/min — ABNORMAL LOW (ref >60)
Glucose, Bld: 100 mg/dL — ABNORMAL HIGH (ref 70–99)
Potassium: 3.8 mmol/L (ref 3.5–5.1)
Sodium: 139 mmol/L (ref 135–145)
Total Bilirubin: 1.1 mg/dL (ref 0.3–1.2)
Total Protein: 6.2 g/dL — ABNORMAL LOW (ref 6.5–8.1)

## 2021-11-07 LAB — RAPID URINE DRUG SCREEN, HOSP PERFORMED
Amphetamines: NOT DETECTED
Barbiturates: NOT DETECTED
Benzodiazepines: NOT DETECTED
Cocaine: NOT DETECTED
Opiates: NOT DETECTED
Tetrahydrocannabinol: NOT DETECTED

## 2021-11-07 LAB — TROPONIN I (HIGH SENSITIVITY)
Troponin I (High Sensitivity): 77 ng/L — ABNORMAL HIGH (ref <18)
Troponin I (High Sensitivity): 79 ng/L — ABNORMAL HIGH (ref <18)

## 2021-11-07 LAB — BRAIN NATRIURETIC PEPTIDE: B Natriuretic Peptide: >4500 pg/mL — ABNORMAL HIGH (ref 0.0–100.0)

## 2021-11-07 MED ORDER — SODIUM CHLORIDE 0.9% FLUSH
3.0000 mL | Freq: Two times a day (BID) | INTRAVENOUS | Status: DC
  Administered 2021-11-07 – 2021-11-09 (×4): 3 mL via INTRAVENOUS

## 2021-11-07 MED ORDER — FUROSEMIDE 20 MG PO TABS: 40.0000 mg | ORAL_TABLET | Freq: Every day | ORAL | Status: DC

## 2021-11-07 MED ORDER — ISOSORBIDE MONONITRATE ER 60 MG PO TB24
60.0000 mg | ORAL_TABLET | Freq: Every evening | ORAL | Status: DC
  Administered 2021-11-07 – 2021-11-08 (×2): 60 mg via ORAL
  Filled 2021-11-07: qty 2
  Filled 2021-11-07: qty 1

## 2021-11-07 MED ORDER — POTASSIUM CHLORIDE CRYS ER 20 MEQ PO TBCR
20.0000 meq | EXTENDED_RELEASE_TABLET | Freq: Two times a day (BID) | ORAL | Status: DC
  Administered 2021-11-08 – 2021-11-09 (×3): 20 meq via ORAL
  Filled 2021-11-07 (×4): qty 1

## 2021-11-07 MED ORDER — ALBUTEROL SULFATE (2.5 MG/3ML) 0.083% IN NEBU
2.5000 mg | INHALATION_SOLUTION | Freq: Four times a day (QID) | RESPIRATORY_TRACT | Status: DC | PRN
  Administered 2021-11-08: 2.5 mg via RESPIRATORY_TRACT
  Filled 2021-11-07: qty 3

## 2021-11-07 MED ORDER — ENOXAPARIN SODIUM 40 MG/0.4ML IJ SOSY: 40.0000 mg | PREFILLED_SYRINGE | INTRAMUSCULAR | Status: DC

## 2021-11-07 MED ORDER — ISOSORBIDE MONONITRATE ER 30 MG PO TB24: 30.0000 mg | ORAL_TABLET | Freq: Every evening | ORAL | Status: DC

## 2021-11-07 MED ORDER — HEPARIN SODIUM (PORCINE) 5000 UNIT/ML IJ SOLN
5000.0000 [IU] | Freq: Two times a day (BID) | INTRAMUSCULAR | Status: DC
  Administered 2021-11-07 – 2021-11-09 (×4): 5000 [IU] via SUBCUTANEOUS
  Filled 2021-11-07 (×4): qty 1

## 2021-11-07 MED ORDER — CARVEDILOL 12.5 MG PO TABS
12.5000 mg | ORAL_TABLET | Freq: Two times a day (BID) | ORAL | Status: DC
  Administered 2021-11-07 – 2021-11-09 (×3): 12.5 mg via ORAL
  Filled 2021-11-07 (×3): qty 1

## 2021-11-07 MED ORDER — CLOPIDOGREL BISULFATE 75 MG PO TABS
75.0000 mg | ORAL_TABLET | Freq: Every day | ORAL | Status: DC
  Administered 2021-11-08 – 2021-11-09 (×2): 75 mg via ORAL
  Filled 2021-11-07 (×3): qty 1

## 2021-11-07 MED ORDER — ALBUTEROL SULFATE HFA 108 (90 BASE) MCG/ACT IN AERS: 2.0000 | INHALATION_SPRAY | Freq: Four times a day (QID) | RESPIRATORY_TRACT | Status: DC | PRN

## 2021-11-07 MED ORDER — ATORVASTATIN CALCIUM 80 MG PO TABS
80.0000 mg | ORAL_TABLET | Freq: Every evening | ORAL | Status: DC
  Administered 2021-11-07 – 2021-11-08 (×2): 80 mg via ORAL
  Filled 2021-11-07: qty 1
  Filled 2021-11-07: qty 2

## 2021-11-07 MED ORDER — ASPIRIN 81 MG PO TBEC
81.0000 mg | DELAYED_RELEASE_TABLET | Freq: Every day | ORAL | Status: DC
  Administered 2021-11-08 – 2021-11-09 (×2): 81 mg via ORAL
  Filled 2021-11-07 (×2): qty 1

## 2021-11-07 NOTE — H&P (Addendum)
History and Physical    Michael [name] AJO:[id number] DOB: 12/26/1955 DOA: 11/07/2021  PCP: Pcp, No (Confirm with patient/family/NH records and if not entered, this has to be entered at [facility] point of entry) Patient coming from: Home  I have personally briefly reviewed patient's old medical records in [facility]  Chief Complaint: Chest pains x2, passed out x1  HPI: Michael [name] is a 66 y.o. male with medical history significant of CAD with known RCA stenosis, probably ischemic cardiomyopathy with chronic HFrEF LVEF 30% 2023, CKD stage IIIb, HTN, HLD, COPD, chronic normocytic anemia, head and neck cancer status post partial face and nose resection and reconstruction, presented with recurrent chest pains and syncope.  [place] episode [place], patient started to have squeezing-like chest pain centrally located, 7-8/10, associated with sweating and shortness of breath.  Episode lasted few minutes and resolved by itself he did not take any intervention, denies any exacerbation or relieving factors.  This morning, he was at home, then he had chest pain similar to yesterday's, this time he started to feel lightheadedness and then passed out on the floor, about 15-20 minutes later he woke up himself and chest pain was gone.  He denies any palpitations, no loss control of urine or bowel movement, no tongue biting.  2 weeks ago, patient was admitted to hospital for NSTEMI, cardiac catheter showed stenosis, RCA up to 70% stenosis, decision was made to pursue conservative management with optimization of his CHF meds, patient reported he has been compliant with all his CHF/BP meds.  ED Course: No tachycardia no hypotension.  Troponin is slightly elevated 77> 79.  EKG showed chronic LVH frequent PVCs and chronic ST T changes on V4-V6.  Cardiology consulted and recommended increase patient's Imdur from 30 mg daily to 60 mg daily.  Review of Systems: As per HPI otherwise  14 point review of systems negative.    Past Medical History:  Diagnosis Date   Aortic atherosclerosis ([place]) 08/03/2021   CAD (coronary artery disease) 08/03/2021   Nuclear stress test 10/19/2017: Inferior/septal myocardial infarction with mild peri-infarct ischemia, EF 33 Med Rx - no symptoms   Carotid artery disease (HCC)    Chronic diastolic heart failure (HCC)    CKD (chronic kidney disease) stage 3, GFR 30-59 ml/min (HCC)    COPD (chronic obstructive pulmonary disease) ([place])    Dysrhythmia    Essential hypertension    Head and neck cancer ([place]) 2019   Right facial basal cell carcinoma s/p resection with right partial mastectomy and partal rhinectomy with skin graft (06/13/17)   Iron deficiency anemia    Urinary retention     Past Surgical History:  Procedure Laterality Date   ANKLE CLOSED REDUCTION Right    open reduction   BASAL CELL CARCINOMA EXCISION  2019   at [facility]   BIOPSY  07/19/2021   Procedure: BIOPSY;  Surgeon: Eloise Harman, DO;  [facility];  Service: Endoscopy;;   COLONOSCOPY N/A 05/31/2017   Procedure: COLONOSCOPY;  Surgeon: Daneil Dolin, MD;  [facility];  Service: Endoscopy;  Laterality: N/A;  2:45pm   COLONOSCOPY WITH PROPOFOL N/A 07/19/2021   Procedure: COLONOSCOPY WITH PROPOFOL;  Surgeon: Eloise Harman, DO;  [facility];  Service: Endoscopy;  Laterality: N/A;  3:00pm, moved up to 9:00   CYSTOSCOPY N/A 10/29/2018   Procedure: CYSTOSCOPY, CLOT EVACUATION;  Surgeon: Ceasar Mons, MD;  Location: [facility];  Service: Urology;  Laterality: N/A;   PARTIAL COLECTOMY Right 08/11/2021   Procedure: PARTIAL COLECTOMY, OPEN RIGHT HEMICOLECTOMY;  Surgeon: Virl Cagey, MD;  Location: [facility];  Service: General;  Laterality: Right;   POLYPECTOMY  05/31/2017   Procedure: POLYPECTOMY;  Surgeon: Daneil Dolin, MD;  [facility];  Service: Endoscopy;;   RIGHT/LEFT HEART CATH AND CORONARY ANGIOGRAPHY N/A 10/22/2021    Procedure: RIGHT/LEFT HEART CATH AND CORONARY ANGIOGRAPHY;  Surgeon: Early Osmond, MD;  Location: [facility];  Service: Cardiovascular;  Laterality: N/A;   TONSILLECTOMY     TRANSCAROTID ARTERY REVASCULARIZATION  Right 02/15/2021   Procedure: RIGHT TRANSCAROTID ARTERY REVASCULARIZATION;  Surgeon: [name] Heck, MD;  Location: [facility];  Service: Vascular;  Laterality: Right;   ULTRASOUND GUIDANCE FOR VASCULAR ACCESS Left 02/15/2021   Procedure: ULTRASOUND GUIDANCE FOR VASCULAR ACCESS;  Surgeon: [name] Heck, MD;  Location: [facility];  Service: Vascular;  Laterality: Left;   XI ROBOTIC ASSISTED SIMPLE PROSTATECTOMY N/A 10/29/2018   Procedure: XI ROBOTIC ASSISTED SIMPLE PROSTATECTOMY;  Surgeon: Cleon Gustin, MD;  Location: [facility];  Service: Urology;  Laterality: N/A;     reports that he quit smoking about 4 months ago. His smoking use included cigarettes. He has a 20.00 pack-year smoking history. He has never been exposed to tobacco smoke. He has never used smokeless tobacco. He reports that he does not currently use alcohol. He reports that he does not use drugs.  No Known Allergies  Family History  Problem Relation Age of Onset   Stroke Father    Cirrhosis Mother    Colon cancer Neg Hx     Prior to Admission medications   Medication Sig Start Date End Date Taking? Authorizing Provider  albuterol (VENTOLIN HFA) 108 (90 Base) MCG/ACT inhaler Inhale 2 puffs into the lungs every 6 (six) hours as needed for wheezing or shortness of breath. 08/14/21   Pappayliou, Barnetta Chapel A, DO  amLODipine (NORVASC) 5 MG tablet Take 5 mg by mouth daily. 10/12/21   [provider]  aspirin EC 81 MG tablet Take 81 mg by mouth daily.    [provider]  atorvastatin (LIPITOR) 80 MG tablet Take 1 tablet (80 mg total) by mouth every evening. 08/10/21   Swinyer, Lanice Schwab, NP  carvedilol (COREG) 12.5 MG tablet Take 1 tablet (12.5 mg total) by mouth 2 (two) times daily with  a meal. 10/26/21   Aline August, MD  clopidogrel (PLAVIX) 75 MG tablet Take 1 tablet (75 mg total) by mouth daily. 10/26/21   Aline August, MD  ergocalciferol (VITAMIN D2) 1.25 MG (50000 UT) capsule Take 1 capsule (50,000 Units total) by mouth once a week. Patient not taking: Reported on 10/20/2021 08/26/21   Harriett Rush, PA-C  Evolocumab (REPATHA SURECLICK) 381 MG/ML SOAJ Inject 1 pen into the skin every 14 (fourteen) days. Patient not taking: Reported on 10/20/2021 03/09/21   Satira Sark, MD  furosemide (LASIX) 40 MG tablet Take 1 tablet (40 mg total) by mouth daily. 10/26/21 11/25/21  Aline August, MD  isosorbide mononitrate (IMDUR) 30 MG 24 hr tablet Take 1 tablet (30 mg total) by mouth every evening. 08/10/21   Swinyer, Lanice Schwab, NP  oxybutynin (DITROPAN) 5 MG tablet 1 tablet Orally three times a day Patient not taking: Reported on 10/20/2021    [provider]  potassium chloride SA (KLOR-CON M) 20 MEQ tablet Take 1 tablet (20 mEq total) by mouth 2 (two) times daily. 10/26/21  Aline August, MD    Physical Exam: Vitals:   11/07/21 1500 11/07/21 1530 11/07/21 1600 11/07/21 1715  BP: (!) 142/76 (!) 157/70 (!) 165/70 (!) 150/88  Pulse: 63 64 76 65  Resp: '14 16 16 18  '$ Temp:  (!) 97.4 F (36.3 C)    TempSrc:  Oral    SpO2: 98% 100% 98% 100%  Weight:      Height:        Constitutional: NAD, calm, comfortable Vitals:   11/07/21 1500 11/07/21 1530 11/07/21 1600 11/07/21 1715  BP: (!) 142/76 (!) 157/70 (!) 165/70 (!) 150/88  Pulse: 63 64 76 65  Resp: '14 16 16 18  '$ Temp:  (!) 97.4 F (36.3 C)    TempSrc:  Oral    SpO2: 98% 100% 98% 100%  Weight:      Height:       Eyes: PERRL, lids and conjunctivae normal ENMT: Mucous membranes are moist. Posterior pharynx clear of any exudate or lesions.Normal dentition.  Neck: normal, supple, no masses, no thyromegaly Respiratory: clear to auscultation bilaterally, no wheezing, no crackles. Normal respiratory effort.  No accessory muscle use.  Cardiovascular: Regular rate and rhythm, no murmurs / rubs / gallops. No extremity edema. 2+ pedal pulses. No carotid bruits.  Abdomen: no tenderness, no masses palpated. No hepatosplenomegaly. Bowel sounds positive.  Musculoskeletal: no clubbing / cyanosis. No joint deformity upper and lower extremities. Good ROM, no contractures. Normal muscle tone.  Skin: no rashes, lesions, ulcers. No induration Neurologic: CN 2-12 grossly intact. Sensation intact, DTR normal. Strength 5/5 in all 4.  Psychiatric: Normal judgment and insight. Alert and oriented x 3. Normal mood.    Labs on Admission: I have personally reviewed following labs and imaging studies  CBC: Recent Labs  Lab 11/07/21 1206  WBC 9.6  NEUTROABS 7.2  HGB 10.3*  HCT 33.6*  MCV 85.3  PLT 716   Basic Metabolic Panel: Recent Labs  Lab 11/07/21 1206  NA 139  K 3.8  CL 110  CO2 23  GLUCOSE 100*  BUN 25*  CREATININE 1.56*  CALCIUM 8.5*   GFR: Estimated Creatinine Clearance: 41.2 mL/min (A) (by C-G formula based on SCr of 1.56 mg/dL (H)). Liver Function Tests: Recent Labs  Lab 11/07/21 1206  AST 40  ALT 30  ALKPHOS 143*  BILITOT 1.1  PROT 6.2*  ALBUMIN 3.0*   No results for input(s): "LIPASE", "AMYLASE" in the last 168 hours. No results for input(s): "AMMONIA" in the last 168 hours. Coagulation Profile: No results for input(s): "INR", "PROTIME" in the last 168 hours. Cardiac Enzymes: No results for input(s): "CKTOTAL", "CKMB", "CKMBINDEX", "TROPONINI" in the last 168 hours. BNP (last 3 results) No results for input(s): "PROBNP" in the last 8760 hours. HbA1C: No results for input(s): "HGBA1C" in the last 72 hours. CBG: No results for input(s): "GLUCAP" in the last 168 hours. Lipid Profile: No results for input(s): "CHOL", "HDL", "LDLCALC", "TRIG", "CHOLHDL", "LDLDIRECT" in the last 72 hours. Thyroid Function Tests: No results for input(s): "TSH", "T4TOTAL", "FREET4", "T3FREE",  "THYROIDAB" in the last 72 hours. Anemia Panel: No results for input(s): "VITAMINB12", "FOLATE", "FERRITIN", "TIBC", "IRON", "RETICCTPCT" in the last 72 hours. Urine analysis:    Component Value Date/Time   COLORURINE YELLOW 02/09/2021 1348   APPEARANCEUR Cloudy (A) 06/16/2021 0936   LABSPEC 1.025 02/09/2021 1348   LABSPEC 1.018 04/23/2011 1044   PHURINE 6.0 02/09/2021 1348   GLUCOSEU Negative 06/16/2021 0936   GLUCOSEU Negative 04/23/2011 1044   HGBUR TRACE (  A) 02/09/2021 1348   BILIRUBINUR Negative 06/16/2021 0936   BILIRUBINUR Negative 04/23/2011 [place] 02/09/2021 1348   PROTEINUR Trace (A) 06/16/2021 0936   PROTEINUR NEGATIVE 02/09/2021 1348   NITRITE Positive (A) 06/16/2021 0936   NITRITE POSITIVE (A) 02/09/2021 1348   LEUKOCYTESUR 2+ (A) 06/16/2021 0936   LEUKOCYTESUR SMALL (A) 02/09/2021 1348   LEUKOCYTESUR Trace 04/23/2011 1044    Radiological Exams on Admission: DG Chest Port 1 View  Result Date: 11/07/2021 CLINICAL DATA:  Chest pain EXAM: PORTABLE CHEST 1 VIEW COMPARISON:  Multiple chest x-rays, most recently October 20, 2021 FINDINGS: Cardiomegaly, similar to prior. Stable mediastinal contours. No focal pulmonary opacity. No large pleural effusion or pneumothorax. No acute osseous abnormality. The visualized upper abdomen is unremarkable. IMPRESSION: 1. Cardiomegaly. 2. No acute pulmonary abnormality. Electronically Signed   By: Beryle Flock M.D.   On: 11/07/2021 12:15    EKG: Independently reviewed.  Sinus, with frequent PVCs, chronic ST-T changes on V4-V6  Assessment/Plan Principal Problem:   Syncope  (please populate well all problems here in Problem List. (For example, if patient is on BP meds at home and you resume or decide to hold them, it is a problem that needs to be her. Same for CAD, COPD, HLD and so on)  Anginal-like chest pain -Cardiology went through patient recent cath results and recommend increased dosage of Imdur for  now. -Troponin elevation pattern is flat, unlikely ACS.  Repeat troponin level tomorrow morning. -As needed nitroglycerin -Cardiology will see the patient and recommend further management plan. -UDS  Syncope -Appears to be related to angina episode.  However, telemetry monitor and EKG does show patient has frequent PVCs, along with history of LVEF= 30%, raised concern about degeneration of ventricle or arrhythmia.  Telemetry monitoring 24 hours, consider LifeVest on discharge, will Discuss with cardiology. -Other DDx, clinically appears to be euvolemic, vasovagal less likely, check orthostatic vital signs.  Chronic ischemic cardiomyopathy and chronic HFrEF -Euvolemic, continue daily Lasix -Discontinue amlodipine given the depressed LVEF -Continue Coreg, Imdur dosage increased. -Recent EKG shows QRS interval 125-139 and suspected LBBB pattern on QRS, may benefit from outpatient CRT evaluation.  HTN -As above  CKD stage IIIb -Euvolemic, creatinine level stable, continue home dose of diuresis  CAD with recent NSTEMI -Aspirin Plavix, Repatha  DVT prophylaxis: Heparin subcu Code Status: Full Code Family Communication: None at bedside Disposition Plan: Expect less than 2 midnight hospital stay Consults called: Cardiology Admission status: Telemetry observation   Lequita Halt MD [facility] Pager [phone]  11/07/2021, 5:36 PM

## 2021-11-07 NOTE — ED Triage Notes (Signed)
Pt bib ems from home with c/o central sternum chest pain.

## 2021-11-07 NOTE — ED Provider Notes (Signed)
[facility] EMERGENCY DEPARTMENT Provider Note   CSN: [id number] Arrival date & time: 11/07/21  1123     History  Chief Complaint  Patient presents with   Chest Pain    Michael [name] is a 66 y.o. male.  Pt is a 66y/o male with hx of carotid artery disease, COPD, CAD, CKD stage IIIb, hypertension, prior head and neck cancer, and chronic diastolic/systolic CHF with recent hospitalization due to CHF exacerbation and found to have EF of 30% and cardiac catheterization showed mild to moderate nonobstructive disease.  Cardiology recommended medical management and adjusted doses of some of his medications who is presenting today after an episode of chest pain.  Patient reports that he had an episode of chest pain yesterday while he was shopping at [facility] which was in the left side of his chest achy and uncomfortable that lasted about 40 to 45 minutes and made him vomit.  It did resolve spontaneously.  However this morning he reports feeling okay when he woke up but then the pain started again when he was sitting at the table getting ready to eat breakfast.  Again it lasted 40 to 45 minutes.  It resolved by the time EMS arrived.  He reports it made him feel nauseated but he did not vomit today.  He felt dizzy like he might pass out and was diaphoretic per EMS.  They did not give him any medications as his symptoms resolved prior to their arrival.  Patient was reporting he wanted to go to [facility] but they encouraged him to come here given his recent cardiac history.  He denies having any stents in his heart has not noticed any new swelling has been compliant with his medication.  He has not had fever, cough or diarrhea.  He reports his appetite has been fine and eating did not seem to cause his symptoms.  He has been feeling short of breath since his hemicolectomy in July but does not feel that it is any different than baseline.  The history is provided by the patient, the EMS  personnel and medical records.       Home Medications Prior to Admission medications   Medication Sig Start Date End Date Taking? Authorizing Provider  albuterol (VENTOLIN HFA) 108 (90 Base) MCG/ACT inhaler Inhale 2 puffs into the lungs every 6 (six) hours as needed for wheezing or shortness of breath. 08/14/21  Yes Pappayliou, Barnetta Chapel A, DO  amLODipine (NORVASC) 5 MG tablet Take 5 mg by mouth daily. 10/12/21  Yes [provider]  aspirin EC 81 MG tablet Take 81 mg by mouth daily.   Yes [provider]  atorvastatin (LIPITOR) 80 MG tablet Take 1 tablet (80 mg total) by mouth every evening. 08/10/21  Yes Swinyer, Lanice Schwab, NP  carvedilol (COREG) 12.5 MG tablet Take 1 tablet (12.5 mg total) by mouth 2 (two) times daily with a meal. 10/26/21  Yes Aline August, MD  clopidogrel (PLAVIX) 75 MG tablet Take 1 tablet (75 mg total) by mouth daily. 10/26/21  Yes Aline August, MD  furosemide (LASIX) 40 MG tablet Take 1 tablet (40 mg total) by mouth daily. 10/26/21 11/25/21 Yes Aline August, MD  isosorbide mononitrate (IMDUR) 30 MG 24 hr tablet Take 1 tablet (30 mg total) by mouth every evening. 08/10/21  Yes Swinyer, Lanice Schwab, NP  potassium chloride SA (KLOR-CON M) 20 MEQ tablet Take 1 tablet (20 mEq total) by mouth 2 (two) times daily. 10/26/21  Yes Alekh,  Kshitiz, MD  ergocalciferol (VITAMIN D2) 1.25 MG (50000 UT) capsule Take 1 capsule (50,000 Units total) by mouth once a week. Patient not taking: Reported on 10/20/2021 08/26/21   Harriett Rush, PA-C  Evolocumab (REPATHA SURECLICK) 124 MG/ML SOAJ Inject 1 pen into the skin every 14 (fourteen) days. Patient not taking: Reported on 10/20/2021 03/09/21   Satira Sark, MD  metoprolol succinate (TOPROL-XL) 25 MG 24 hr tablet Take 25 mg by mouth daily. 11/04/21   [provider]      Allergies    Patient has no known allergies.    Review of Systems   Review of Systems  Physical Exam Updated Vital Signs BP  128/67   Pulse 63   Temp 98.1 F (36.7 C) (Oral)   Resp 20   Ht 6' (1.829 m)   Wt 63.4 kg   SpO2 96%   BMI 18.96 kg/m  Physical Exam Vitals and nursing note reviewed.  Constitutional:      General: He is not in acute distress.    Appearance: He is well-developed.  HENT:     Head: Normocephalic and atraumatic.     Comments: Facial deformity related to skin flap after removal of the right side of his nose and cheek bone.  Edentulous Eyes:     Conjunctiva/sclera: Conjunctivae normal.     Pupils: Pupils are equal, round, and reactive to light.  Cardiovascular:     Rate and Rhythm: Normal rate and regular rhythm.     Heart sounds: No murmur heard. Pulmonary:     Effort: Pulmonary effort is normal. Tachypnea present. No respiratory distress.     Breath sounds: Normal breath sounds. No wheezing or rales.  Abdominal:     General: There is no distension.     Palpations: Abdomen is soft.     Tenderness: There is no abdominal tenderness. There is no guarding or rebound.  Musculoskeletal:        General: No tenderness. Normal range of motion.     Cervical back: Normal range of motion and neck supple.     Right lower leg: No edema.     Left lower leg: No edema.  Skin:    General: Skin is warm and dry.     Findings: No erythema or rash.  Neurological:     Mental Status: He is alert and oriented to person, place, and time. Mental status is at baseline.  Psychiatric:        Behavior: Behavior normal.     ED Results / Procedures / Treatments   Labs (all labs ordered are listed, but only abnormal results are displayed) Labs Reviewed  CBC WITH DIFFERENTIAL/PLATELET - Abnormal; Notable for the following components:      Result Value   RBC 3.94 (*)    Hemoglobin 10.3 (*)    HCT 33.6 (*)    RDW 20.2 (*)    All other components within normal limits  BRAIN NATRIURETIC PEPTIDE - Abnormal; Notable for the following components:   B Natriuretic Peptide >4,500.0 (*)    All other  components within normal limits  COMPREHENSIVE METABOLIC PANEL - Abnormal; Notable for the following components:   Glucose, Bld 100 (*)    BUN 25 (*)    Creatinine, Ser 1.56 (*)    Calcium 8.5 (*)    Total Protein 6.2 (*)    Albumin 3.0 (*)    Alkaline Phosphatase 143 (*)    GFR, Estimated 48 (*)    All  other components within normal limits  TROPONIN I (HIGH SENSITIVITY) - Abnormal; Notable for the following components:   Troponin I (High Sensitivity) 77 (*)    All other components within normal limits  TROPONIN I (HIGH SENSITIVITY) - Abnormal; Notable for the following components:   Troponin I (High Sensitivity) 79 (*)    All other components within normal limits  RAPID URINE DRUG SCREEN, HOSP PERFORMED  MAGNESIUM  PHOSPHORUS    EKG EKG Interpretation  Date/Time:  Sunday November 07 2021 11:28:53 EDT Ventricular Rate:  64 PR Interval:  295 QRS Duration: 138 QT Interval:  445 QTC Calculation: 460 R Axis:   49 Text Interpretation: Sinus or ectopic atrial rhythm new Multiform ventricular premature complexes Prolonged PR interval LVH with secondary repolarization abnormality Probable anterior infarct, age indeterminate   Confirmed by Blanchie Dessert ([id number]) on 11/07/2021 11:34:43 AM  Radiology DG Chest Port 1 View  Result Date: 11/07/2021 CLINICAL DATA:  Chest pain EXAM: PORTABLE CHEST 1 VIEW COMPARISON:  Multiple chest x-rays, most recently October 20, 2021 FINDINGS: Cardiomegaly, similar to prior. Stable mediastinal contours. No focal pulmonary opacity. No large pleural effusion or pneumothorax. No acute osseous abnormality. The visualized upper abdomen is unremarkable. IMPRESSION: 1. Cardiomegaly. 2. No acute pulmonary abnormality. Electronically Signed   By: Beryle Flock M.D.   On: 11/07/2021 12:15    Procedures Procedures    Medications Ordered in ED Medications  aspirin EC tablet 81 mg (has no administration in time range)  atorvastatin (LIPITOR) tablet 80 mg  (80 mg Oral Given 11/07/21 1826)  carvedilol (COREG) tablet 12.5 mg (12.5 mg Oral Given 11/07/21 1826)  clopidogrel (PLAVIX) tablet 75 mg (has no administration in time range)  potassium chloride SA (KLOR-CON M) CR tablet 20 mEq (has no administration in time range)  sodium chloride flush (NS) 0.9 % injection 3 mL (has no administration in time range)  isosorbide mononitrate (IMDUR) 24 hr tablet 60 mg (60 mg Oral Given 11/07/21 1826)  furosemide (LASIX) tablet 40 mg (has no administration in time range)  albuterol (PROVENTIL) (2.5 MG/3ML) 0.083% nebulizer solution 2.5 mg (has no administration in time range)  heparin injection 5,000 Units (has no administration in time range)    ED Course/ Medical Decision Making/ A&P                           Medical Decision Making Amount and/or Complexity of Data Reviewed Independent Historian: EMS External Data Reviewed: notes.    Details: Recent hospitalization Labs: ordered. Decision-making details documented in ED Course. Radiology: ordered and independent interpretation performed. Decision-making details documented in ED Course. ECG/medicine tests: ordered and independent interpretation performed. Decision-making details documented in ED Course.  Risk Decision regarding hospitalization.   Pt with multiple medical problems and comorbidities and presenting today with a complaint that caries a high risk for morbidity and mortality.  Here today with a story concerning for ACS.  Patient had chest pain today that lasted 40 to 45 minutes and resolved just prior to EMS arrival.  EMS did report that patient was hypotensive and they gave him a small amount of fluid but otherwise his symptoms are completely resolved upon arrival to the emergency room.  I independently interpreted patient's EKG both here and by EMS.  It does show T wave inversion and ST depression in V4 through V6 which appears similar to prior EKGs.  No findings suggestive of STEMI.  Patient  denies any infectious symptoms and lower  concern for pneumonia, acute abdominal pathology or UTI.  Patient was recently admitted for CHF exacerbation and NSTEMI and concern for recurrence of those symptoms however patient does not look significantly overloaded on exam today.  Breath sounds are fairly clear.  There is no wheezing to suggest COPD exacerbation.  Patient does not have prior history of PE or DVT but does have risk factors including tobacco use, recent surgery in July and recent hospitalization.  He is on Plavix but no other anticoagulants.  Lower suspicion for GI cause of patient's chest pain today.  Patient is currently pain-free.  Labs and imaging are pending.  8:53 PM I independently interpreted patient's labs today and CBC is unchanged with mild chronic anemia with a hemoglobin of 10, BNP is still elevated greater than 4500 and CMP is stable with chronic kidney disease with creatinine of 1.56 which is baseline and improved since recent hospitalization and normal electrolytes.  Patient's troponin is still elevated at 77 but improved from prior hospitalization. I have independently visualized and interpreted pt's images today.  Chest x-ray shows cardiomegaly but no significant pleural effusions or pulmonary edema.  On repeat evaluation patient is still pain-free and has no complaints at this time.  We will repeat a troponin to trend.  8:53 PM Patient's repeat troponin is not significantly changed and is at 79.  Spoke with Dr. Percival Spanish with the cardiology team.  Discussed patient's case and symptoms.  Patient does have diffuse disease and cannot state that his pain today and yesterday was not cardiac related.  He reports at this time he feels that he needs most likely adjustment of his medication and recommended increasing Imdur.  Given his known disease would not be unreasonable to observe the patient based on patient preference.  Discussed with the patient the findings and patient feels more  comfortable being admitted to the hospital because if he is at home and develops the pain again he does not know what he should do about it.  He also almost syncopized today when he developed the pain.  Feel that it is reasonable to admit to the hospitalist service, adjust medications and ensure he does not have any further episodes and medication is effective.  Cardiology said they would be available for consult as needed.          Final Clinical Impression(s) / ED Diagnoses Final diagnoses:  Angina at rest    Rx / DC Orders ED Discharge Orders     None         Blanchie Dessert, MD 11/07/21 2053

## 2021-11-08 DIAGNOSIS — R55 Syncope and collapse: Secondary | ICD-10-CM | POA: Diagnosis not present

## 2021-11-08 DIAGNOSIS — R42 Dizziness and giddiness: Secondary | ICD-10-CM | POA: Diagnosis not present

## 2021-11-08 DIAGNOSIS — J441 Chronic obstructive pulmonary disease with (acute) exacerbation: Secondary | ICD-10-CM

## 2021-11-08 DIAGNOSIS — I25118 Atherosclerotic heart disease of native coronary artery with other forms of angina pectoris: Secondary | ICD-10-CM

## 2021-11-08 DIAGNOSIS — I5042 Chronic combined systolic (congestive) and diastolic (congestive) heart failure: Secondary | ICD-10-CM | POA: Diagnosis not present

## 2021-11-08 LAB — BASIC METABOLIC PANEL
Anion gap: 7 (ref 5–15)
BUN: 30 mg/dL — ABNORMAL HIGH (ref 8–23)
CO2: 24 mmol/L (ref 22–32)
Calcium: 8.4 mg/dL — ABNORMAL LOW (ref 8.9–10.3)
Chloride: 107 mmol/L (ref 98–111)
Creatinine, Ser: 1.85 mg/dL — ABNORMAL HIGH (ref 0.61–1.24)
GFR, Estimated: 39 mL/min — ABNORMAL LOW (ref >60)
Glucose, Bld: 136 mg/dL — ABNORMAL HIGH (ref 70–99)
Potassium: 3.6 mmol/L (ref 3.5–5.1)
Sodium: 138 mmol/L (ref 135–145)

## 2021-11-08 LAB — CBG MONITORING, ED: Glucose-Capillary: 126 mg/dL — ABNORMAL HIGH (ref 70–99)

## 2021-11-08 LAB — PHOSPHORUS: Phosphorus: 3.6 mg/dL (ref 2.5–4.6)

## 2021-11-08 LAB — D-DIMER, QUANTITATIVE: D-Dimer, Quant: 2.05 ug/mL-FEU — ABNORMAL HIGH (ref 0.00–0.50)

## 2021-11-08 LAB — MAGNESIUM: Magnesium: 2 mg/dL (ref 1.7–2.4)

## 2021-11-08 LAB — TROPONIN I (HIGH SENSITIVITY): Troponin I (High Sensitivity): 82 ng/L — ABNORMAL HIGH (ref <18)

## 2021-11-08 MED ORDER — FUROSEMIDE 40 MG PO TABS
40.0000 mg | ORAL_TABLET | Freq: Every day | ORAL | Status: DC
  Administered 2021-11-08 – 2021-11-09 (×2): 40 mg via ORAL
  Filled 2021-11-08 (×2): qty 1

## 2021-11-08 MED ORDER — AMLODIPINE BESYLATE 5 MG PO TABS
5.0000 mg | ORAL_TABLET | Freq: Every evening | ORAL | Status: DC
  Administered 2021-11-08: 5 mg via ORAL
  Filled 2021-11-08: qty 1

## 2021-11-08 NOTE — Plan of Care (Signed)
  Problem: Education: Goal: Understanding of CV disease, CV risk reduction, and recovery process will improve Outcome: Progressing Goal: Individualized Educational Video(s) Outcome: Progressing   Problem: Activity: Goal: Ability to return to baseline activity level will improve Outcome: Progressing   Problem: Cardiovascular: Goal: Ability to achieve and maintain adequate cardiovascular perfusion will improve Outcome: Progressing Goal: Vascular access site(s) Level 0-1 will be maintained Outcome: Progressing   Problem: Health Behavior/Discharge Planning: Goal: Ability to safely manage health-related needs after discharge will improve Outcome: Progressing   Problem: Education: Goal: Knowledge of condition and prescribed therapy will improve Outcome: Progressing   Problem: Cardiac: Goal: Will achieve and/or maintain adequate cardiac output Outcome: Progressing   Problem: Physical Regulation: Goal: Complications related to the disease process, condition or treatment will be avoided or minimized Outcome: Progressing

## 2021-11-08 NOTE — ED Notes (Signed)
ED TO INPATIENT HANDOFF REPORT    S Name/Age/Gender Michael [name] 66 y.o. male Room/Bed: 041C/041C  Code Status   Code Status: Full Code  Home/SNF/Other Home Patient oriented to: self, place, time, and situation Is this baseline? Yes   Triage Complete: Triage complete  Chief Complaint Syncope [R55]  Triage Note Pt bib ems from home with c/o central sternum chest pain.      Allergies No Known Allergies  Level of Care/Admitting Diagnosis ED Disposition     ED Disposition  Admit   Condition  --   Comment  Hospital Area: [facility] [[id number]]  Level of Care: Telemetry Medical [104]  May place patient in observation at [facility] or [facility] if equivalent level of care is available:: No  Covid Evaluation: Asymptomatic - no recent exposure (last 10 days) testing not required  Diagnosis: Syncope [206001]  Admitting Physician: Lequita Halt [[id number]]  Attending Physician: Lequita Halt [[id number]]          B Medical/Surgery History Past Medical History:  Diagnosis Date   Aortic atherosclerosis ([place]) 08/03/2021   CAD (coronary artery disease) 08/03/2021   Nuclear stress test 10/19/2017: Inferior/septal myocardial infarction with mild peri-infarct ischemia, EF 33 Med Rx - no symptoms   Carotid artery disease (HCC)    Chronic diastolic heart failure (HCC)    CKD (chronic kidney disease) stage 3, GFR 30-59 ml/min (HCC)    COPD (chronic obstructive pulmonary disease) ([place])    Dysrhythmia    Essential hypertension    Head and neck cancer ([place]) 2019   Right facial basal cell carcinoma s/p resection with right partial mastectomy and partal rhinectomy with skin graft (06/13/17)   Iron deficiency anemia    Urinary retention    Past Surgical History:  Procedure Laterality Date   ANKLE CLOSED REDUCTION Right    open reduction   BASAL CELL CARCINOMA EXCISION  2019   at [facility]   BIOPSY  07/19/2021   Procedure: BIOPSY;  Surgeon: Eloise Harman, DO;   [facility];  Service: Endoscopy;;   COLONOSCOPY N/A 05/31/2017   Procedure: COLONOSCOPY;  Surgeon: Daneil Dolin, MD;  [facility];  Service: Endoscopy;  Laterality: N/A;  2:45pm   COLONOSCOPY WITH PROPOFOL N/A 07/19/2021   Procedure: COLONOSCOPY WITH PROPOFOL;  Surgeon: Eloise Harman, DO;  [facility];  Service: Endoscopy;  Laterality: N/A;  3:00pm, moved up to 9:00   CYSTOSCOPY N/A 10/29/2018   Procedure: CYSTOSCOPY, CLOT EVACUATION;  Surgeon: Ceasar Mons, MD;  Location: [facility];  Service: Urology;  Laterality: N/A;   PARTIAL COLECTOMY Right 08/11/2021   Procedure: PARTIAL COLECTOMY, OPEN RIGHT HEMICOLECTOMY;  Surgeon: Virl Cagey, MD;  Location: [facility];  Service: General;  Laterality: Right;   POLYPECTOMY  05/31/2017   Procedure: POLYPECTOMY;  Surgeon: Daneil Dolin, MD;  [facility];  Service: Endoscopy;;   RIGHT/LEFT HEART CATH AND CORONARY ANGIOGRAPHY N/A 10/22/2021   Procedure: RIGHT/LEFT HEART CATH AND CORONARY ANGIOGRAPHY;  Surgeon: Early Osmond, MD;  Location: [facility];  Service: Cardiovascular;  Laterality: N/A;   TONSILLECTOMY     TRANSCAROTID ARTERY REVASCULARIZATION  Right 02/15/2021   Procedure: RIGHT TRANSCAROTID ARTERY REVASCULARIZATION;  Surgeon: [name] Heck, MD;  Location: [facility];  Service: Vascular;  Laterality: Right;   ULTRASOUND GUIDANCE FOR VASCULAR ACCESS Left 02/15/2021   Procedure: ULTRASOUND GUIDANCE FOR VASCULAR ACCESS;  Surgeon: [name] Heck, MD;  Location: [facility]  OR;  Service: Vascular;  Laterality: Left;   XI ROBOTIC ASSISTED SIMPLE PROSTATECTOMY N/A 10/29/2018   Procedure: XI ROBOTIC ASSISTED SIMPLE PROSTATECTOMY;  Surgeon: Cleon Gustin, MD;  [facility];  Service: Urology;  Laterality: N/A;     A IV Location/Drains/Wounds Patient Lines/Drains/Airways Status     Active Line/Drains/Airways     Name Placement date Placement time Site Days   Peripheral IV  11/07/21 20 G Right Antecubital 11/07/21  2145  Antecubital  1   External Urinary Catheter 10/22/21  1915  --  17   Incision (Closed) 02/15/21 Neck Right 02/15/21  1157  -- 266   Incision (Closed) 02/15/21 Groin Left 02/15/21  1305  -- 266   Incision (Closed) 08/11/21 Abdomen 08/11/21  1432  -- 89   Pressure Injury 10/20/21 Perineum Mid Stage 1 -  Intact skin with non-blanchable redness of a localized area usually over a bony prominence. Deeply reddened.  Non-blanchable and raw throughout perineum 10/20/21  1612  -- 19            Intake/Output Last 24 hours  Intake/Output Summary (Last 24 hours) at 11/08/2021 1203 Last data filed at 11/08/2021 0240 Gross per 24 hour  Intake --  Output 350 ml  Net -350 ml    Labs/Imaging Results for orders placed or performed during the hospital encounter of 11/07/21 (from the past 48 hour(s))  CBC with Differential/Platelet     Status: Abnormal   Collection Time: 11/07/21 12:06 PM  Result Value Ref Range   WBC 9.6 4.0 - 10.5 K/uL   RBC 3.94 (L) 4.22 - 5.81 MIL/uL   Hemoglobin 10.3 (L) 13.0 - 17.0 g/dL   HCT 33.6 (L) 39.0 - 52.0 %   MCV 85.3 80.0 - 100.0 fL   MCH 26.1 26.0 - 34.0 pg   MCHC 30.7 30.0 - 36.0 g/dL   RDW 20.2 (H) 11.5 - 15.5 %   Platelets 286 150 - 400 K/uL   nRBC 0.0 0.0 - 0.2 %   Neutrophils Relative % 76 %   Neutro Abs 7.2 1.7 - 7.7 K/uL   Lymphocytes Relative 14 %   Lymphs Abs 1.4 0.7 - 4.0 K/uL   Monocytes Relative 6 %   Monocytes Absolute 0.5 0.1 - 1.0 K/uL   Eosinophils Relative 3 %   Eosinophils Absolute 0.3 0.0 - 0.5 K/uL   Basophils Relative 1 %   Basophils Absolute 0.1 0.0 - 0.1 K/uL   Immature Granulocytes 0 %   Abs Immature Granulocytes 0.03 0.00 - 0.07 K/uL    Comment: Performed at [facility], [address], [place], [place] [address]  Brain natriuretic peptide     Status: Abnormal   Collection Time: 11/07/21 12:06 PM  Result Value Ref Range   B Natriuretic Peptide >4,500.0 (H) 0.0 - 100.0 pg/mL     Comment: Performed at [facility] [address] [place], [place] [address]  Comprehensive metabolic panel     Status: Abnormal   Collection Time: 11/07/21 12:06 PM  Result Value Ref Range   Sodium 139 135 - 145 mmol/L   Potassium 3.8 3.5 - 5.1 mmol/L   Chloride 110 98 - 111 mmol/L   CO2 23 22 - 32 mmol/L   Glucose, Bld 100 (H) 70 - 99 mg/dL    Comment: Glucose reference range applies only to samples taken after fasting for at least 8 hours.   BUN 25 (H) 8 - 23 mg/dL   Creatinine,  Ser 1.56 (H) 0.61 - 1.24 mg/dL   Calcium 8.5 (L) 8.9 - 10.3 mg/dL   Total Protein 6.2 (L) 6.5 - 8.1 g/dL   Albumin 3.0 (L) 3.5 - 5.0 g/dL   AST 40 15 - 41 U/L   ALT 30 0 - 44 U/L   Alkaline Phosphatase 143 (H) 38 - 126 U/L   Total Bilirubin 1.1 0.3 - 1.2 mg/dL   GFR, Estimated 48 (L) >60 mL/min    Comment: (NOTE) Calculated using the CKD-EPI Creatinine Equation (2021)    Anion gap 6 5 - 15    Comment: Performed at [facility], [place] [address] [place], [place] [address]  Troponin I (High Sensitivity)     Status: Abnormal   Collection Time: 11/07/21 12:06 PM  Result Value Ref Range   Troponin I (High Sensitivity) 77 (H) <18 ng/L    Comment: (NOTE) Elevated high sensitivity troponin I (hsTnI) values and significant  changes across serial measurements may suggest ACS but many other  chronic and acute conditions are known to elevate hsTnI results.  Refer to the "Links" section for chest pain algorithms and additional  guidance. Performed at [facility], [place] [address] [place], [place] [address]   Troponin I (High Sensitivity)     Status: Abnormal   Collection Time: 11/07/21  2:19 PM  Result Value Ref Range   Troponin I (High Sensitivity) 79 (H) <18 ng/L    Comment: (NOTE) Elevated high sensitivity troponin I (hsTnI) values and significant  changes across serial measurements may suggest ACS but many other  chronic and acute conditions are known to elevate hsTnI results.   Refer to the "Links" section for chest pain algorithms and additional  guidance. Performed at [facility], [place] [address] [place], [place] [address]   Rapid urine drug screen (hospital performed)     Status: None   Collection Time: 11/07/21  5:41 PM  Result Value Ref Range   Opiates NONE DETECTED NONE DETECTED   Cocaine NONE DETECTED NONE DETECTED   Benzodiazepines NONE DETECTED NONE DETECTED   Amphetamines NONE DETECTED NONE DETECTED   Tetrahydrocannabinol NONE DETECTED NONE DETECTED   Barbiturates NONE DETECTED NONE DETECTED    Comment: (NOTE) DRUG SCREEN FOR MEDICAL PURPOSES ONLY.  IF CONFIRMATION IS NEEDED FOR ANY PURPOSE, NOTIFY LAB WITHIN 5 DAYS.  LOWEST DETECTABLE LIMITS FOR URINE DRUG SCREEN Drug Class                     Cutoff (ng/mL) Amphetamine and metabolites    1000 Barbiturate and metabolites    200 Benzodiazepine                 366 Tricyclics and metabolites     300 Opiates and metabolites        300 Cocaine and metabolites        300 THC                            50 Performed at [facility], [place] [address], [place], [place] [address]   CBG monitoring, ED     Status: Abnormal   Collection Time: 11/08/21  4:53 AM  Result Value Ref Range   Glucose-Capillary 126 (H) 70 - 99 mg/dL    Comment: Glucose reference range applies only to samples taken after fasting for at least 8 hours.  Magnesium     Status: None  Collection Time: 11/08/21  4:54 AM  Result Value Ref Range   Magnesium 2.0 1.7 - 2.4 mg/dL    Comment: Performed at [facility], [place] [address] [place], [place] [address]  Phosphorus     Status: None   Collection Time: 11/08/21  4:54 AM  Result Value Ref Range   Phosphorus 3.6 2.5 - 4.6 mg/dL    Comment: Performed at [facility] [address], [place], [place] [address]  Troponin I (High Sensitivity)     Status: Abnormal   Collection Time: 11/08/21  4:54 AM  Result Value Ref Range   Troponin I (High Sensitivity)  82 (H) <18 ng/L    Comment: (NOTE) Elevated high sensitivity troponin I (hsTnI) values and significant  changes across serial measurements may suggest ACS but many other  chronic and acute conditions are known to elevate hsTnI results.  Refer to the "Links" section for chest pain algorithms and additional  guidance. Performed at [facility], [place] [address], [place], [place] [address]   Basic metabolic panel     Status: Abnormal   Collection Time: 11/08/21  4:54 AM  Result Value Ref Range   Sodium 138 135 - 145 mmol/L   Potassium 3.6 3.5 - 5.1 mmol/L   Chloride 107 98 - 111 mmol/L   CO2 24 22 - 32 mmol/L   Glucose, Bld 136 (H) 70 - 99 mg/dL    Comment: Glucose reference range applies only to samples taken after fasting for at least 8 hours.   BUN 30 (H) 8 - 23 mg/dL   Creatinine, Ser 1.85 (H) 0.61 - 1.24 mg/dL   Calcium 8.4 (L) 8.9 - 10.3 mg/dL   GFR, Estimated 39 (L) >60 mL/min    Comment: (NOTE) Calculated using the CKD-EPI Creatinine Equation (2021)    Anion gap 7 5 - 15    Comment: Performed at [facility] [address], [place], [place] [address]   DG Chest Port 1 View  Result Date: 11/07/2021 CLINICAL DATA:  Chest pain EXAM: PORTABLE CHEST 1 VIEW COMPARISON:  Multiple chest x-rays, most recently October 20, 2021 FINDINGS: Cardiomegaly, similar to prior. Stable mediastinal contours. No focal pulmonary opacity. No large pleural effusion or pneumothorax. No acute osseous abnormality. The visualized upper abdomen is unremarkable. IMPRESSION: 1. Cardiomegaly. 2. No acute pulmonary abnormality. Electronically Signed   By: Beryle Flock M.D.   On: 11/07/2021 12:15    Pending Labs Unresulted Labs (From admission, onward)     Start     Ordered   11/09/21 9242  Basic metabolic panel  Daily at 5am,   R      11/08/21 0853   11/08/21 0930  D-dimer, quantitative  Once,   R        11/08/21 0930            Vitals/Pain Today's Vitals   11/08/21 0945 11/08/21  1015 11/08/21 1019 11/08/21 1023  BP:      Pulse: 66 71    Resp: (!) 27 14  (!) 22  Temp:      TempSrc:      SpO2: 100% 97%    Weight:      Height:      PainSc:   0-No pain     Isolation Precautions No active isolations  Medications Medications  aspirin EC tablet 81 mg (has no administration in time range)  atorvastatin (LIPITOR) tablet 80 mg (80 mg Oral Given 11/07/21 1826)  carvedilol (COREG) tablet 12.5 mg (12.5 mg Oral Given 11/07/21 1826)  clopidogrel (PLAVIX) tablet 75 mg (has no administration in time range)  potassium chloride SA (KLOR-CON M) CR tablet 20 mEq (20 mEq Oral Patient Refused/Not Given 11/07/21 2116)  sodium chloride flush (NS) 0.9 % injection 3 mL (3 mLs Intravenous Given 11/07/21 2116)  isosorbide mononitrate (IMDUR) 24 hr tablet 60 mg (60 mg Oral Given 11/07/21 1826)  albuterol (PROVENTIL) (2.5 MG/3ML) 0.083% nebulizer solution 2.5 mg (has no administration in time range)  heparin injection 5,000 Units (5,000 Units Subcutaneous Given 11/07/21 2116)  furosemide (LASIX) tablet 40 mg (has no administration in time range)    Mobility walks with device Low fall risk   Focused Assessments Cardiac Assessment Handoff:  Cardiac Rhythm: Normal sinus rhythm Lab Results  Component Value Date   CKTOTAL 140 05/09/2018   CKMB 12.2 (H) 05/09/2018   TROPONINI 0.62 (HH) 05/10/2018   Lab Results  Component Value Date   DDIMER 2.81 (H) 05/09/2018   Does the Patient currently have chest pain? No    R Recommendations: See Admitting Provider Note

## 2021-11-08 NOTE — Progress Notes (Addendum)
PROGRESS NOTE    Michael [name]  IRC:[id number] DOB: 10-15-1955 DOA: 11/07/2021 PCP: Pcp, No     Brief Narrative:   From admission h and p Michael [name] is a 66 y.o. male with medical history significant of CAD with known RCA stenosis, probably ischemic cardiomyopathy with chronic HFrEF LVEF 30% 2023, CKD stage IIIb, HTN, HLD, COPD, chronic normocytic anemia, head and neck cancer status post partial face and nose resection and reconstruction, presented with recurrent chest pains and syncope.   [place] episode [place], patient started to have squeezing-like chest pain centrally located, 7-8/10, associated with sweating and shortness of breath.  Episode lasted few minutes and resolved by itself he did not take any intervention, denies any exacerbation or relieving factors.  This morning, he was at home, then he had chest pain similar to yesterday's, this time he started to feel lightheadedness and then passed out on the floor, about 15-20 minutes later he woke up himself and chest pain was gone.  He denies any palpitations, no loss control of urine or bowel movement, no tongue biting.  2 weeks ago, patient was admitted to hospital for NSTEMI, cardiac catheter showed stenosis, RCA up to 70% stenosis, decision was made to pursue conservative management with optimization of his CHF meds, patient reported he has been compliant with all his CHF/BP meds.   Assessment & Plan:   Principal Problem:   Syncope Active Problems:   Essential hypertension   Tobacco abuse   HFmrEF (heart failure with mildly reduced EF)   Carotid artery disease (HCC)   COPD exacerbation (HCC)   Stage 3a chronic kidney disease (HCC)  Anginal-like chest pain -Troponin elevation pattern is flat, though this could represent unstable angina -As needed nitroglycerin -Cardiology will see the patient and recommend further management plan (addendum 14:00 - no cardiology consult, I spoke with  cardiology and they say they are unaware of the consult, they will now see the patient)   Syncope Likely cardiac mediated. - cardiology to see - monitor telemetry - with recent hospitalizations and report of some dyspnea is at risk for PE, and d dimer is elevated, will order vq scan given ckd   Chronic ischemic cardiomyopathy and chronic HFrEF -Euvolemic, continue daily Lasix -amlodipine held by admitting provider -Continue Coreg, Imdur dosage increased per initial cardiology recs   HTN -As above   CKD stage IIIb -Euvolemic, creatinine level stable, continue home dose of diuresis   CAD with recent NSTEMI -Aspirin Plavix statin  Recent hemicolectomy - no invasive carcinoma on path   DVT prophylaxis: heparin Code Status: full Family Communication: none @ bedside  Level of care: Telemetry Medical Status is: Observation   Consultants:  cardiology  Procedures: none  Antimicrobials:  none    Subjective: Reports some mild labored breathing. No chest pain or syncope recurrence  Objective: Vitals:   11/08/21 0500 11/08/21 0510 11/08/21 0600 11/08/21 0700  BP: (!) 146/83  (!) 157/64 (!) 151/85  Pulse: 70  70 65  Resp: 13  (!) 24 14  Temp:  98.3 F (36.8 C)    TempSrc:  Oral    SpO2: 95%  96% 97%  Weight:      Height:        Intake/Output Summary (Last 24 hours) at 11/08/2021 0847 Last data filed at 11/08/2021 0240 Gross per 24 hour  Intake --  Output 350 ml  Net -350 ml   Filed Weights   11/07/21 1200  Weight: 63.4  kg    Examination:  General exam: Appears calm and comfortable  Respiratory system: Clear to auscultation. Respiratory effort normal. Cardiovascular system: S1 & S2 heard, distant heart sounds, mild systolic murmur Gastrointestinal system: Abdomen is nondistended, soft and nontender. No organomegaly or masses felt. Normal bowel sounds heard. Central nervous system: Alert and oriented. No focal neurological deficits. Extremities: Symmetric  5 x 5 power. Skin: No rashes, lesions or ulcers Psychiatry: Judgement and insight appear normal. Mood & affect appropriate.     Data Reviewed: I have personally reviewed following labs and imaging studies  CBC: Recent Labs  Lab 11/07/21 1206  WBC 9.6  NEUTROABS 7.2  HGB 10.3*  HCT 33.6*  MCV 85.3  PLT 220   Basic Metabolic Panel: Recent Labs  Lab 11/07/21 1206 11/08/21 0454  NA 139  --   K 3.8  --   CL 110  --   CO2 23  --   GLUCOSE 100*  --   BUN 25*  --   CREATININE 1.56*  --   CALCIUM 8.5*  --   MG  --  2.0  PHOS  --  3.6   GFR: Estimated Creatinine Clearance: 41.2 mL/min (A) (by C-G formula based on SCr of 1.56 mg/dL (H)). Liver Function Tests: Recent Labs  Lab 11/07/21 1206  AST 40  ALT 30  ALKPHOS 143*  BILITOT 1.1  PROT 6.2*  ALBUMIN 3.0*   No results for input(s): "LIPASE", "AMYLASE" in the last 168 hours. No results for input(s): "AMMONIA" in the last 168 hours. Coagulation Profile: No results for input(s): "INR", "PROTIME" in the last 168 hours. Cardiac Enzymes: No results for input(s): "CKTOTAL", "CKMB", "CKMBINDEX", "TROPONINI" in the last 168 hours. BNP (last 3 results) No results for input(s): "PROBNP" in the last 8760 hours. HbA1C: No results for input(s): "HGBA1C" in the last 72 hours. CBG: Recent Labs  Lab 11/08/21 0453  GLUCAP 126*   Lipid Profile: No results for input(s): "CHOL", "HDL", "LDLCALC", "TRIG", "CHOLHDL", "LDLDIRECT" in the last 72 hours. Thyroid Function Tests: No results for input(s): "TSH", "T4TOTAL", "FREET4", "T3FREE", "THYROIDAB" in the last 72 hours. Anemia Panel: No results for input(s): "VITAMINB12", "FOLATE", "FERRITIN", "TIBC", "IRON", "RETICCTPCT" in the last 72 hours. Urine analysis:    Component Value Date/Time   COLORURINE YELLOW 02/09/2021 1348   APPEARANCEUR Cloudy (A) 06/16/2021 0936   LABSPEC 1.025 02/09/2021 1348   LABSPEC 1.018 04/23/2011 1044   PHURINE 6.0 02/09/2021 1348   GLUCOSEU  Negative 06/16/2021 0936   GLUCOSEU Negative 04/23/2011 1044   HGBUR TRACE (A) 02/09/2021 1348   BILIRUBINUR Negative 06/16/2021 0936   BILIRUBINUR Negative 04/23/2011 1044   KETONESUR NEGATIVE 02/09/2021 1348   PROTEINUR Trace (A) 06/16/2021 0936   PROTEINUR NEGATIVE 02/09/2021 1348   NITRITE Positive (A) 06/16/2021 0936   NITRITE POSITIVE (A) 02/09/2021 1348   LEUKOCYTESUR 2+ (A) 06/16/2021 0936   LEUKOCYTESUR SMALL (A) 02/09/2021 1348   LEUKOCYTESUR Trace 04/23/2011 1044   Sepsis Labs: '@LABRCNTIP'$ (procalcitonin:4,lacticidven:4)  )No results found for this or any previous visit (from the past 240 hour(s)).       Radiology Studies: DG Chest Port 1 View  Result Date: 11/07/2021 CLINICAL DATA:  Chest pain EXAM: PORTABLE CHEST 1 VIEW COMPARISON:  Multiple chest x-rays, most recently October 20, 2021 FINDINGS: Cardiomegaly, similar to prior. Stable mediastinal contours. No focal pulmonary opacity. No large pleural effusion or pneumothorax. No acute osseous abnormality. The visualized upper abdomen is unremarkable. IMPRESSION: 1. Cardiomegaly. 2. No acute pulmonary abnormality. Electronically  Signed   By: Beryle Flock M.D.   On: 11/07/2021 12:15        Scheduled Meds:  aspirin EC  81 mg Oral Daily   atorvastatin  80 mg Oral QPM   carvedilol  12.5 mg Oral BID WC   clopidogrel  75 mg Oral Daily   furosemide  40 mg Oral Daily   heparin injection (subcutaneous)  5,000 Units Subcutaneous Q12H   isosorbide mononitrate  60 mg Oral QPM   potassium chloride SA  20 mEq Oral BID   sodium chloride flush  3 mL Intravenous Q12H   Continuous Infusions:   LOS: 0 days     Desma Maxim, MD [facility]   If 7PM-7AM, please contact night-coverage [email or web address] Password [facility] 11/08/2021, 8:47 AM

## 2021-11-08 NOTE — Consult Note (Signed)
Cardiology Consultation   Patient ID: Decklyn Hyder MRN: [id number]; DOB: [birth year]  Admit date: 11/07/2021 Date of Consult: 11/08/2021  PCP:  Merryl Hacker, No   [place] Providers Cardiologist:  Rozann Lesches, MD        Patient Profile:   Angelo Prindle is a 66 y.o. male with a hx of chronic combined diastolic/systolic CHF, COPD, CKD stage IIIb, CAD (70% mid RCA stenosis, 80% mrg lesion), carotid artery stenosis, hypertension, prior head/neck cancer s/p partial face and nose resection/reconstruction who is being seen 11/08/2021 for the evaluation of chest pain/concern of near-syncope at the request of Dr. Si Raider.  History of Present Illness:   Mr. Broom is a 65 year old male who presented to the ED on 10/8 after experiencing symptoms of chest pain along with dizziness. Patient says that he was sitting down drinking coffee when symptoms began suddenly. Per patient, his landlord called 911. He also reports an episode of squeezing/central chest pain rated 8/10 with shortness of breath and diaphoresis while shopping in [place] on 10/7. These symptoms were self-limited and patient did not take a nitroglycerin. Patient confirms his symptoms as dizziness/room spinning.  Regarding syncopal episode mentioned in other progress notes, patient tells me today that this event actually occurred several weeks ago, prior to his previous admission. He only felt dizzy/lightheaded in the days proceeding current admission, no actual fainting events.  Of note, patient was admitted 2 weeks ago to [facility] with symptoms of worsening shortness of breath/orthopnea. Troponin that admission was as high as 450. Patient was transferred to [facility] and underwent right and left heart catheterization which found mid RCA lesion 70% stenosed as well as an acute marginal lesion that was 80% stenosed. Patient was chest pain free and medical management was pursued. He was ultimately discharged on Lasix '40mg'$  daily, Imdur '30mg'$   once daily, Coreg 12.'5mg'$  twice daily, Amlodipine '5mg'$  once daily.  Past Medical History:  Diagnosis Date   Aortic atherosclerosis ([place]) 08/03/2021   CAD (coronary artery disease) 08/03/2021   Nuclear stress test 10/19/2017: Inferior/septal myocardial infarction with mild peri-infarct ischemia, EF 33 Med Rx - no symptoms   Carotid artery disease (HCC)    Chronic diastolic heart failure (HCC)    CKD (chronic kidney disease) stage 3, GFR 30-59 ml/min (HCC)    COPD (chronic obstructive pulmonary disease) ([place])    Dysrhythmia    Essential hypertension    Head and neck cancer ([place]) 2019   Right facial basal cell carcinoma s/p resection with right partial mastectomy and partal rhinectomy with skin graft (06/13/17)   Iron deficiency anemia    Urinary retention     Past Surgical History:  Procedure Laterality Date   ANKLE CLOSED REDUCTION Right    open reduction   BASAL CELL CARCINOMA EXCISION  2019   at [facility]   BIOPSY  07/19/2021   Procedure: BIOPSY;  Surgeon: Eloise Harman, DO;  [facility];  Service: Endoscopy;;   COLONOSCOPY N/A 05/31/2017   Procedure: COLONOSCOPY;  Surgeon: Daneil Dolin, MD;  [facility];  Service: Endoscopy;  Laterality: N/A;  2:45pm   COLONOSCOPY WITH PROPOFOL N/A 07/19/2021   Procedure: COLONOSCOPY WITH PROPOFOL;  Surgeon: Eloise Harman, DO;  [facility];  Service: Endoscopy;  Laterality: N/A;  3:00pm, moved up to 9:00   CYSTOSCOPY N/A 10/29/2018   Procedure: CYSTOSCOPY, CLOT EVACUATION;  Surgeon: Ceasar Mons, MD;  Location: [facility];  Service: Urology;  Laterality: N/A;  PARTIAL COLECTOMY Right 08/11/2021   Procedure: PARTIAL COLECTOMY, OPEN RIGHT HEMICOLECTOMY;  Surgeon: Virl Cagey, MD;  Location: [facility];  Service: General;  Laterality: Right;   POLYPECTOMY  05/31/2017   Procedure: POLYPECTOMY;  Surgeon: Daneil Dolin, MD;  [facility];  Service: Endoscopy;;   RIGHT/LEFT HEART CATH AND CORONARY  ANGIOGRAPHY N/A 10/22/2021   Procedure: RIGHT/LEFT HEART CATH AND CORONARY ANGIOGRAPHY;  Surgeon: Early Osmond, MD;  Location: [place] CV LAB;  Service: Cardiovascular;  Laterality: N/A;   TONSILLECTOMY     TRANSCAROTID ARTERY REVASCULARIZATION  Right 02/15/2021   Procedure: RIGHT TRANSCAROTID ARTERY REVASCULARIZATION;  Surgeon: [name] Heck, MD;  Location: [place];  Service: Vascular;  Laterality: Right;   ULTRASOUND GUIDANCE FOR VASCULAR ACCESS Left 02/15/2021   Procedure: ULTRASOUND GUIDANCE FOR VASCULAR ACCESS;  Surgeon: [name] Heck, MD;  Location: [place];  Service: Vascular;  Laterality: Left;   XI ROBOTIC ASSISTED SIMPLE PROSTATECTOMY N/A 10/29/2018   Procedure: XI ROBOTIC ASSISTED SIMPLE PROSTATECTOMY;  Surgeon: Cleon Gustin, MD;  [facility];  Service: Urology;  Laterality: N/A;     Home Medications:  Prior to Admission medications   Medication Sig Start Date End Date Taking? Authorizing Provider  albuterol (VENTOLIN HFA) 108 (90 Base) MCG/ACT inhaler Inhale 2 puffs into the lungs every 6 (six) hours as needed for wheezing or shortness of breath. 08/14/21  Yes Pappayliou, Barnetta Chapel A, DO  amLODipine (NORVASC) 5 MG tablet Take 5 mg by mouth daily. 10/12/21  Yes [provider]  aspirin EC 81 MG tablet Take 81 mg by mouth daily.   Yes [provider]  atorvastatin (LIPITOR) 80 MG tablet Take 1 tablet (80 mg total) by mouth every evening. 08/10/21  Yes Swinyer, Lanice Schwab, NP  carvedilol (COREG) 12.5 MG tablet Take 1 tablet (12.5 mg total) by mouth 2 (two) times daily with a meal. 10/26/21  Yes Aline August, MD  clopidogrel (PLAVIX) 75 MG tablet Take 1 tablet (75 mg total) by mouth daily. 10/26/21  Yes Aline August, MD  furosemide (LASIX) 40 MG tablet Take 1 tablet (40 mg total) by mouth daily. 10/26/21 11/25/21 Yes Aline August, MD  isosorbide mononitrate (IMDUR) 30 MG 24 hr tablet Take 1 tablet (30 mg total) by mouth every evening. 08/10/21   Yes Swinyer, Lanice Schwab, NP  potassium chloride SA (KLOR-CON M) 20 MEQ tablet Take 1 tablet (20 mEq total) by mouth 2 (two) times daily. 10/26/21  Yes Aline August, MD  ergocalciferol (VITAMIN D2) 1.25 MG (50000 UT) capsule Take 1 capsule (50,000 Units total) by mouth once a week. Patient not taking: Reported on 10/20/2021 08/26/21   Harriett Rush, PA-C  Evolocumab (REPATHA SURECLICK) 294 MG/ML SOAJ Inject 1 pen into the skin every 14 (fourteen) days. Patient not taking: Reported on 10/20/2021 03/09/21   Satira Sark, MD    Inpatient Medications: Scheduled Meds:  aspirin EC  81 mg Oral Daily   atorvastatin  80 mg Oral QPM   carvedilol  12.5 mg Oral BID WC   clopidogrel  75 mg Oral Daily   furosemide  40 mg Oral Daily   heparin injection (subcutaneous)  5,000 Units Subcutaneous Q12H   isosorbide mononitrate  60 mg Oral QPM   potassium chloride SA  20 mEq Oral BID   sodium chloride flush  3 mL Intravenous Q12H   Continuous Infusions:  PRN Meds: albuterol  Allergies:   No Known Allergies  Social History:   Social  History   Socioeconomic History   Marital status: Married    Spouse name: Not on file   Number of children: Not on file   Years of education: Not on file   Highest education level: Not on file  Occupational History   Not on file  Tobacco Use   Smoking status: Former    Packs/day: 0.50    Years: 40.00    Total pack years: 20.00    Types: Cigarettes    Quit date: 07/02/2021    Years since quitting: 0.3    Passive exposure: Never   Smokeless tobacco: Never   Tobacco comments:    Quit on 06/09/21  Vaping Use   Vaping Use: Never used  Substance and Sexual Activity   Alcohol use: Not Currently   Drug use: Never   Sexual activity: Not Currently  Other Topics Concern   Not on file  Social History Narrative   Not on file   Social Determinants of Health   Financial Resource Strain: Low Risk  (12/11/2019)   Overall Financial Resource Strain (CARDIA)     Difficulty of Paying Living Expenses: Not hard at all  Food Insecurity: No Food Insecurity (10/20/2021)   Hunger Vital Sign    Worried About Running Out of Food in the Last Year: Never true    [place] in the Last Year: Never true  Transportation Needs: No Transportation Needs (10/20/2021)   PRAPARE - Hydrologist (Medical): No    Lack of Transportation (Non-Medical): No  Physical Activity: Inactive (12/11/2019)   Exercise Vital Sign    Days of Exercise per Week: 0 days    Minutes of Exercise per Session: 0 min  Stress: No Stress Concern Present (12/11/2019)   [place]    Feeling of Stress : Not at all  Social Connections: Socially Isolated (12/11/2019)   Social Connection and Isolation Panel [NHANES]    Frequency of Communication with Friends and Family: Never    Frequency of Social Gatherings with Friends and Family: Never    Attends Religious Services: Never    Marine scientist or Organizations: No    Attends Archivist Meetings: Never    Marital Status: Divorced  Human resources officer Violence: Not At Risk (10/20/2021)   Humiliation, Afraid, Rape, and Kick questionnaire    Fear of Current or Ex-Partner: No    Emotionally Abused: No    Physically Abused: No    Sexually Abused: No    Family History:    Family History  Problem Relation Age of Onset   Stroke Father    Cirrhosis Mother    Colon cancer Neg Hx      ROS:  Please see the history of present illness.   All other ROS reviewed and negative.     Physical Exam/Data:   Vitals:   11/08/21 1015 11/08/21 1023 11/08/21 1200 11/08/21 1259  BP:   (!) 170/87 (!) 152/88  Pulse: 71  64 87  Resp: 14 (!) '22 10 17  '$ Temp:   98 F (36.7 C) 97.7 F (36.5 C)  TempSrc:    Oral  SpO2: 97%  99% 100%  Weight:    65.5 kg  Height:    6' (1.829 m)    Intake/Output Summary (Last 24 hours) at 11/08/2021 1437 Last  data filed at 11/08/2021 0240 Gross per 24 hour  Intake --  Output 350 ml  Net -  350 ml      11/08/2021   12:59 PM 11/07/2021   12:00 PM 10/26/2021    6:53 AM  Last 3 Weights  Weight (lbs) 144 lb 8 oz 139 lb 12.4 oz 139 lb 12.4 oz  Weight (kg) 65.545 kg 63.4 kg 63.4 kg     Body mass index is 19.6 kg/m.  General:  Well nourished, well developed, in no acute distress HEENT: normal Neck: no JVD Vascular: No carotid bruits; Distal pulses 2+ bilaterally Cardiac:  normal S1, S2; RRR; no murmur Lungs:  clear to auscultation bilaterally, no wheezing, rhonchi or rales  Abd: soft, nontender, no hepatomegaly  Ext: no edema Musculoskeletal:  No deformities, BUE and BLE strength normal and equal Skin: warm and dry  Neuro:  CNs 2-12 intact, no focal abnormalities noted Psych:  Normal affect   EKG:  No new tracing today Telemetry:  Telemetry was personally reviewed and demonstrates:  sinus rhythm with frequent PVCs, both randomly and in bigeminy pattern. Isolated ~7 beat run of NSVT.  Relevant CV Studies:  10/22/21 RHC/LHC    Mid RCA lesion is 70% stenosed.   Acute Mrg lesion is 80% stenosed.   1.  Mild to moderate obstructive coronary artery disease that is outweighed by the patient's degree of LV dysfunction.  There is a significant bifurcation lesion of the right coronary artery and large RV marginal branch.  This is moderately calcified and relatively tortuous.  Given the patient's lack of chest pain, medical therapy should be pursued. 2.  Cardiac output of 6.3 L/min, cardiac index of 3.3 L/min/m, mean RA pressure of 16 mmHg, mean wedge pressure of 21 mmHg, and LVEDP of 21 mmHg.   Recommendation: Goal-directed medical therapy with augmentation of diuretics; consider dual antiplatelet therapy for 1 year for medical treatment of acute coronary syndrome.  Diagnostic Dominance: Right   10/21/21 TTE  IMPRESSIONS     1. LV function is depressed with diffuse hypokinesis in the lateral  and  inferiror walls Compared to echo from 2019, LVEF is worse. . Left  ventricular ejection fraction, by estimation, is 30%. The left ventricle  has moderately decreased function. The  left ventricular internal cavity size was severely dilated. There is mild  left ventricular hypertrophy. Left ventricular diastolic parameters are  consistent with Grade III diastolic dysfunction (restrictive). Elevated  left atrial pressure.   2. Right ventricular systolic function is low normal. The right  ventricular size is normal. There is normal pulmonary artery systolic  pressure.   3. Left atrial size was severely dilated.   4. Right atrial size was moderately dilated.   5. A small pericardial effusion is present. The pericardial effusion is  circumferential.   6. Mild mitral valve regurgitation.   7. The aortic valve is tricuspid. Aortic valve regurgitation is not  visualized. Aortic valve sclerosis is present, with no evidence of aortic  valve stenosis.   8. The inferior vena cava dilated.   FINDINGS   Left Ventricle: LV function is depressed with diffuse hypokinesis in the  lateral and inferiror walls Compared to echo from 2019, LVEF is worse.  Left ventricular ejection fraction, by estimation, is 30%. The left  ventricle has moderately decreased  function. The left ventricular internal cavity size was severely dilated.  There is mild left ventricular hypertrophy. Left ventricular diastolic  parameters are consistent with Grade III diastolic dysfunction  (restrictive). Elevated left atrial pressure.   Right Ventricle: The right ventricular size is normal. Right vetricular  wall  thickness was not assessed. Right ventricular systolic function is  low normal. There is normal pulmonary artery systolic pressure. The  tricuspid regurgitant velocity is 1.60  m/s, and with an assumed right atrial pressure of 8 mmHg, the estimated  right ventricular systolic pressure is 42.5 mmHg.   Left  Atrium: Left atrial size was severely dilated.   Right Atrium: Right atrial size was moderately dilated.   Pericardium: A small pericardial effusion is present. The pericardial  effusion is circumferential.   Mitral Valve: There is mild thickening of the mitral valve leaflet(s).  Mild mitral annular calcification. Mild mitral valve regurgitation. MV  peak gradient, 6.9 mmHg. The mean mitral valve gradient is 2.0 mmHg.   Tricuspid Valve: The tricuspid valve is normal in structure. Tricuspid  valve regurgitation is mild.   Aortic Valve: The aortic valve is tricuspid. Aortic valve regurgitation is  not visualized. Aortic valve sclerosis is present, with no evidence of  aortic valve stenosis. Aortic valve mean gradient measures 3.0 mmHg.  Aortic valve peak gradient measures 7.2   mmHg. Aortic valve area, by VTI measures 2.65 cm.   Pulmonic Valve: The pulmonic valve was normal in structure. Pulmonic valve  regurgitation is not visualized.   Aorta: The aortic root and ascending aorta are structurally normal, with  no evidence of dilitation.   Venous: The inferior vena cava dilated.   IAS/Shunts: No atrial level shunt detected by color flow Doppler.   Laboratory Data:  High Sensitivity Troponin:   Recent Labs  Lab 10/20/21 1335 10/21/21 0626 11/07/21 1206 11/07/21 1419 11/08/21 0454  TROPONINIHS 418* 291* 77* 79* 82*     Chemistry Recent Labs  Lab 11/07/21 1206 11/08/21 0454  NA 139 138  K 3.8 3.6  CL 110 107  CO2 23 24  GLUCOSE 100* 136*  BUN 25* 30*  CREATININE 1.56* 1.85*  CALCIUM 8.5* 8.4*  MG  --  2.0  GFRNONAA 48* 39*  ANIONGAP 6 7    Recent Labs  Lab 11/07/21 1206  PROT 6.2*  ALBUMIN 3.0*  AST 40  ALT 30  ALKPHOS 143*  BILITOT 1.1   Lipids No results for input(s): "CHOL", "TRIG", "HDL", "LABVLDL", "LDLCALC", "CHOLHDL" in the last 168 hours.  Hematology Recent Labs  Lab 11/07/21 1206  WBC 9.6  RBC 3.94*  HGB 10.3*  HCT 33.6*  MCV 85.3   MCH 26.1  MCHC 30.7  RDW 20.2*  PLT 286   Thyroid No results for input(s): "TSH", "FREET4" in the last 168 hours.  BNP Recent Labs  Lab 11/07/21 1206  BNP >4,500.0*    DDimer No results for input(s): "DDIMER" in the last 168 hours.   Radiology/Studies:  DG Chest Port 1 View  Result Date: 11/07/2021 CLINICAL DATA:  Chest pain EXAM: PORTABLE CHEST 1 VIEW COMPARISON:  Multiple chest x-rays, most recently October 20, 2021 FINDINGS: Cardiomegaly, similar to prior. Stable mediastinal contours. No focal pulmonary opacity. No large pleural effusion or pneumothorax. No acute osseous abnormality. The visualized upper abdomen is unremarkable. IMPRESSION: 1. Cardiomegaly. 2. No acute pulmonary abnormality. Electronically Signed   By: Beryle Flock M.D.   On: 11/07/2021 12:15     Assessment and Plan:  Westyn Driggers is a 66 y.o. male with a hx of chronic combined diastolic/systolic CHF, COPD, CKD stage IIIb, CAD (70% mid RCA stenosis, 80% mrg lesion), hypertension, prior head/neck cancer s/p partial face and nose resection/reconstruction who is being seen 11/08/2021 for the evaluation of chest pain/concern of near-syncope at the  request of Dr. Si Raider.  Chronic combined systolic/diastolic heart failure  Patient with BNP > 4500 on admission, LVEF 30% as of 10/21/21 TTE. [place] 10/22/21 with cardiac output of 6.3 L/min, cardiac index of 3.3 L/min/m, mean RA pressure of 16 mmHg, mean wedge pressure of 21 mmHg, and LVEDP of 21 mmHg.  Patient appears euvolemic on exam, no orthopnea. Continue Lasix '40mg'$  daily, Coreg 12.'5mg'$ . ACEi/ARB and MRA have historically been held due to CKD. Severity of HF continues to be mismatched with degree of CAD and in the setting of possible syncope, may need to consider a more expedited cardiac MRI, defer to attending final recs.   Hypertension Concern for syncope  Patient notably hypertensive this admission, BP as high as 170/87. In the setting of recent admission for  shortness of breath and hypertensive urgency with systolic BP >161, it seems unlikely that patient would be experiencing hypotension/orthostatic related syncope. No significant arrhythmias noted on monitor but given reduced EF, it is possible that patient is having runs of VT that are causing transient near-syncope to syncope. Although patient had a bilateral carotid artery duplex in February of this year, could consider reassessing.    Recommend resuming all home antihypertensives including Amlodipine. With CKD stage III, would consider additional up-titration of Imdur or addition of hydralazine if BP remains elevated.  CAD  Patient with 2 episodes of chest pain since last admission, one with exertion and one while sedentary. Patient s/p RHC and LHC on 10/22/21. Found with Mid RCA lesion 70% stenosed, acute Mrg lesion is 80% stenosed. RCA is not amenable to stenting, medical management only at this time. Troponin this admission 77, 79, 82.   Continue DAPT with ASA/Plavix Continue Coreg 12.'5mg'$  BID Imdur increased to '60mg'$  daily by gen med hospitalist. Patient without chest pain since this change was made. Ranexa could also be considered but given that patient remains hypertensive  Carotid Artery Stenosis   Patient is followed by vascular surgery and is s/p TCAR on 01/2021. 03/23/2021 bilateral carotid duplex with Right Carotid: Velocities in the right ICA are consistent with a 1-39% stenosis. The ECA appears >50% stenosed. Left Carotid: Velocities in the left ICA are consistent with a 1-39% stenosis. The ECA appears <50% stenosed. Vertebrals: Right vertebral artery demonstrates antegrade flow. Left vert atypical.   Continue ASA and Plavix.   CKD Stage III  Creatinine near baseline at 1.85. Okay to continue Lasix.   Risk Assessment/Risk Scores:    New York Heart Association (NYHA) Functional Class NYHA Class II        For questions or updates, please contact [place] Please  consult [email or web address] for contact info under    Signed, Lily Kocher, PA-C  11/08/2021 2:37 PM

## 2021-11-09 ENCOUNTER — Observation Stay (HOSPITAL_COMMUNITY): Payer: Medicare Other

## 2021-11-09 ENCOUNTER — Encounter: Payer: Self-pay | Admitting: Cardiology

## 2021-11-09 ENCOUNTER — Other Ambulatory Visit (HOSPITAL_COMMUNITY): Payer: Self-pay

## 2021-11-09 ENCOUNTER — Other Ambulatory Visit: Payer: Self-pay | Admitting: Physician Assistant

## 2021-11-09 ENCOUNTER — Observation Stay (HOSPITAL_COMMUNITY)
Admit: 2021-11-09 | Discharge: 2021-11-09 | Disposition: A | Discharge status: Home / Self Care | Payer: Medicare Other | Attending: Family Medicine | Admitting: Family Medicine

## 2021-11-09 ENCOUNTER — Encounter: Payer: Self-pay | Admitting: *Deleted

## 2021-11-09 ENCOUNTER — Observation Stay: Payer: Medicare Other

## 2021-11-09 DIAGNOSIS — I493 Ventricular premature depolarization: Secondary | ICD-10-CM

## 2021-11-09 DIAGNOSIS — I5042 Chronic combined systolic (congestive) and diastolic (congestive) heart failure: Secondary | ICD-10-CM

## 2021-11-09 DIAGNOSIS — R0602 Shortness of breath: Secondary | ICD-10-CM | POA: Diagnosis not present

## 2021-11-09 DIAGNOSIS — I259 Chronic ischemic heart disease, unspecified: Secondary | ICD-10-CM

## 2021-11-09 DIAGNOSIS — N1831 Chronic kidney disease, stage 3a: Secondary | ICD-10-CM | POA: Diagnosis not present

## 2021-11-09 DIAGNOSIS — R0600 Dyspnea, unspecified: Secondary | ICD-10-CM | POA: Diagnosis not present

## 2021-11-09 DIAGNOSIS — R55 Syncope and collapse: Secondary | ICD-10-CM

## 2021-11-09 DIAGNOSIS — J439 Emphysema, unspecified: Secondary | ICD-10-CM | POA: Diagnosis not present

## 2021-11-09 LAB — BASIC METABOLIC PANEL
Anion gap: 9 (ref 5–15)
BUN: 23 mg/dL (ref 8–23)
CO2: 25 mmol/L (ref 22–32)
Calcium: 8.6 mg/dL — ABNORMAL LOW (ref 8.9–10.3)
Chloride: 106 mmol/L (ref 98–111)
Creatinine, Ser: 1.72 mg/dL — ABNORMAL HIGH (ref 0.61–1.24)
GFR, Estimated: 43 mL/min — ABNORMAL LOW (ref >60)
Glucose, Bld: 118 mg/dL — ABNORMAL HIGH (ref 70–99)
Potassium: 3.5 mmol/L (ref 3.5–5.1)
Sodium: 140 mmol/L (ref 135–145)

## 2021-11-09 LAB — GLUCOSE, CAPILLARY
Glucose-Capillary: 107 mg/dL — ABNORMAL HIGH (ref 70–99)
Glucose-Capillary: 106 mg/dL — ABNORMAL HIGH (ref 70–99)

## 2021-11-09 MED ORDER — ISOSORBIDE MONONITRATE ER 60 MG PO TB24
60.0000 mg | ORAL_TABLET | Freq: Every evening | ORAL | 0 refills | Status: DC
  Filled 2021-11-09: qty 30, 30d supply, fill #0

## 2021-11-09 MED ORDER — POTASSIUM CHLORIDE CRYS ER 20 MEQ PO TBCR
40.0000 meq | EXTENDED_RELEASE_TABLET | Freq: Once | ORAL | Status: AC
  Administered 2021-11-09: 40 meq via ORAL
  Filled 2021-11-09: qty 2

## 2021-11-09 MED ORDER — TECHNETIUM TO 99M ALBUMIN AGGREGATED
4.1000 | Freq: Once | INTRAVENOUS | Status: AC | PRN
  Administered 2021-11-09: 4.1 via INTRAVENOUS

## 2021-11-09 NOTE — Care Management Obs Status (Signed)
[place] NOTIFICATION   Patient Details  Name: Kaiden Pech MRN: [id number] Date of Birth: 1955-04-21   Medicare Observation Status Notification Given:  Yes    Tom-Johnson, Renea Ee, RN 11/09/2021, 9:20 AM

## 2021-11-09 NOTE — Progress Notes (Signed)
Pharmacist Heart Failure Core Measure Documentation  Assessment: Michael [name] has an EF documented as 25-30% on 10/21/21 by ECHO.  Rationale: Heart failure patients with left ventricular systolic dysfunction (LVSD) and an EF < 40% should be prescribed an angiotensin converting enzyme inhibitor (ACEI) or angiotensin receptor blocker (ARB) at discharge unless a contraindication is documented in the medical record.  This patient is not currently on an ACEI or ARB for HF.  This note is being placed in the record in order to provide documentation that a contraindication to the use of these agents is present for this encounter.  ACE Inhibitor or Angiotensin Receptor Blocker is contraindicated (specify all that apply)  '[]'$   ACEI allergy AND ARB allergy '[]'$   Angioedema '[]'$   Moderate or severe aortic stenosis '[]'$   Hyperkalemia '[]'$   Hypotension '[]'$   Renal artery stenosis '[x]'$   Worsening renal function, preexisting renal disease or dysfunction  Dimple Nanas, PharmD, BCPS 11/09/2021 3:00 PM

## 2021-11-09 NOTE — TOC Transition Note (Signed)
Transition of Care [facility]) - CM/SW Discharge Note   Patient Details  Name: Michael [name] MRN: [id number] Date of Birth: August 05, 1955  [facility] CM/SW Contact:  Tom-Johnson, Renea Ee, RN Phone Number: 11/09/2021, 3:48 PM   Clinical Narrative:     Patient is scheduled for discharge today. Friend/ Neighbor Cecil to transport at discharge. To resume home health with [facility] at discharge. No further TOC needs noted.   Final next level of care: [place] Barriers to Discharge: Barriers Resolved   Patient Goals and CMS Choice Patient states their goals for this hospitalization and ongoing recovery are:: To return home CMS Medicare.gov Compare Post Acute Care list provided to:: Patient Choice offered to / list presented to : Patient  Discharge Placement                Patient to be transferred to facility by: Friend/ Neighbor      Discharge Plan and Services   Discharge Planning Services: CM Consult            DME Arranged: N/A DME Agency: NA       HH Arranged:  (Already active)          Social Determinants of Health (SDOH) Interventions     Readmission Risk Interventions    10/26/2021    2:57 PM 02/18/2021   11:33 AM  Readmission Risk Prevention Plan  Transportation Screening Complete Complete  PCP or Specialist Appt within 5-7 Days  Complete  PCP or Specialist Appt within 3-5 Days Complete   Home Care Screening  Complete  Medication Review (RN CM)  Complete  HRI or Home Care Consult Complete   Social Work Consult for [place] Planning/Counseling Complete   Palliative Care Screening Not Applicable   Medication Review Press photographer) Complete

## 2021-11-09 NOTE — Progress Notes (Unsigned)
Enrolled for [employer] to mail a ZIO XT long term holter monitor to the patients address on file.   Dr. Rozann Lesches to read.

## 2021-11-09 NOTE — TOC Initial Note (Signed)
Transition of Care [facility]) - Initial/Assessment Note    Patient Details  Name: Michael [name] MRN: [id number] Date of Birth: [birth year]  [facility]) CM/SW Contact:    Tom-Johnson, Renea Ee, RN Phone Number: 11/09/2021, 12:58 PM  Clinical Narrative:                  CM spoke with patient at bedside about needs for post hospital transition. Admitted for Syncope. Recently admitted in the hospital. Patient has extensive Cardiac hx,  CKD stage IIIb, COPD, Head and Neck Cancer s/p partial Face and Nose resection and reconstruction.  From home alone. States he and his son are not in good terms.  Home health referral called in to [facility] on last admission. CM notified [facility]ib[facility] Billings of patient's admission.  Uses [facility] for transportation to and from appointments.  No TOC needs or recommendations noted at this time. CM will continue to follow as patient progresses with care towards discharge.     Barriers to Discharge: Continued Medical Work up   Patient Goals and CMS Choice Patient states their goals for this hospitalization and ongoing recovery are:: To return home CMS Medicare.gov Compare Post Acute Care list provided to:: Patient    Expected Discharge Plan and Services     Discharge Planning Services: CM Consult   Living arrangements for the past 2 months: Mobile Home                                      Prior Living Arrangements/Services Living arrangements for the past 2 months: Mobile Home Lives with:: Self Patient language and need for interpreter reviewed:: Yes        Need for Family Participation in Patient Care: Yes (Comment) Care giver support system in place?: Yes (comment)   Criminal Activity/Legal Involvement Pertinent to Current Situation/Hospitalization: No - Comment as needed  Activities of Daily Living Home Assistive Devices/Equipment: None ADL Screening (condition at time of admission) Patient's cognitive ability adequate to safely  complete daily activities?: Yes Is the patient deaf or have difficulty hearing?: No Does the patient have difficulty seeing, even when wearing glasses/contacts?: No Does the patient have difficulty concentrating, remembering, or making decisions?: No Patient able to express need for assistance with ADLs?: No Does the patient have difficulty dressing or bathing?: No Independently performs ADLs?: Yes (appropriate for developmental age) Does the patient have difficulty walking or climbing stairs?: No Weakness of Legs: None Weakness of Arms/Hands: None  Permission Sought/Granted Permission sought to share information with : Case Manager, Customer service manager, Family Supports Permission granted to share information with : Yes, Verbal Permission Granted              Emotional Assessment Appearance:: Appears stated age Attitude/Demeanor/Rapport: Engaged, Gracious Affect (typically observed): Accepting, Appropriate, Calm, Hopeful Orientation: : Oriented to Self, Oriented to Situation Alcohol / Substance Use: Not Applicable Psych Involvement: No (comment)  Admission diagnosis:  Syncope [R55] Angina at rest [I20.89] Patient Active Problem List   Diagnosis Date Noted   Lightheadedness    Angina at rest    Hypertensive urgency    Transaminitis 10/23/2021   Pressure injury of skin 10/22/2021   Type 2 MI (myocardial infarction) ([place]) 10/20/2021   Dilation of pancreatic duct 08/11/2021   Colonic mass 08/06/2021   Preoperative cardiovascular examination 08/03/2021   CAD (coronary artery disease) 08/03/2021   Stage 3a chronic kidney disease ([place]) 08/03/2021  Aortic atherosclerosis ([place]) 08/03/2021   Rectal bleeding 06/16/2021   H/O adenomatous polyp of colon 06/16/2021   Carotid stenosis, asymptomatic, right 02/15/2021   Basal cell carcinoma of [place], right 12/06/2019   Incisional hernia, without obstruction or gangrene 11/15/2019   Elevated PSA 06/19/2019   Benign  prostatic hyperplasia with urinary obstruction 10/29/2018   CAP (community acquired pneumonia) 05/09/2018   Chronic combined systolic (congestive) and diastolic (congestive) heart failure ([place]) 05/09/2018   Lobar pneumonia ([place]) 05/09/2018   Urinary retention 11/27/2017   Abnormal nuclear stress test 17/49/4496   Complicated UTI (urinary tract infection)    Sepsis ([place]) 09/17/2017   Vasovagal syncope 09/17/2017   Sepsis secondary to UTI ([place]) 09/01/2017   Syncope 07/05/2017   COPD exacerbation ([place]) 07/05/2017   Acute renal failure superimposed on chronic kidney disease ([place]) 07/05/2017   Catheter-associated urinary tract infection ([place]) 07/05/2017   Leukocytosis 07/05/2017   Urinary retention with incomplete bladder emptying 07/05/2017   UTI (urinary tract infection) 07/05/2017   Diarrhea 07/05/2017   Carotid artery disease ([place]) 06/25/2017   AKI (acute kidney injury) ([place])    Orthostatic syncope    Syncope due to orthostatic hypotension 06/24/2017   Heart murmur 06/24/2017   Abnormal EKG 06/24/2017   Abnormal PET scan of colon 05/25/2017   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 05/15/2017   Periapical abscess 04/04/2017   Iron (Fe) deficiency anemia 04/04/2017   Essential hypertension 04/02/2017   Anemia 04/02/2017   Acute hypoxemic respiratory failure ([place]) 04/02/2017   Tobacco abuse 04/02/2017   HFmrEF (heart failure with mildly reduced EF) 04/02/2017   Elevated troponin 04/02/2017   Thrombocytosis 04/02/2017   Skin lesion of face    Nasal lesion    PCP:  Pcp, No Pharmacy:   [facility], [place] [address] Phone: [phone] Fax: [phone]  [facility] [address] [address] Phone: [phone] Fax: [phone]     Social Determinants of Health (SDOH) Interventions    Readmission Risk Interventions    10/26/2021    2:57 PM 02/18/2021    11:33 AM  Readmission Risk Prevention Plan  Transportation Screening Complete Complete  PCP or Specialist Appt within 5-7 Days  Complete  PCP or Specialist Appt within 3-5 Days Complete   Home Care Screening  Complete  Medication Review (RN CM)  Complete  HRI or Home Care Consult Complete   Social Work Consult for [place] Planning/Counseling Complete   Palliative Care Screening Not Applicable   Medication Review Press photographer) Complete

## 2021-11-09 NOTE — Progress Notes (Signed)
Heart Failure Nurse Navigator Progress Note  Assess for HV TOC readiness. Pt recently missed HV TOC appt. Rescheduled per cardiology PA request. Pt agreeable, scheduled for 10/18 @ 11AM.   EF 25-30%  No new updates. See previous note from HF Navigator.    Pricilla Holm, MSN, RN Heart Failure Nurse Navigator

## 2021-11-09 NOTE — Discharge Summary (Signed)
Discharge Summary  Michael [name] XNA:[id number] DOB: [birth year]  PCP: Pcp, No  Admit date: 11/07/2021 Discharge date: 11/09/2021  30 Day Unplanned Readmission Risk Score    Flowsheet Row ED to Hosp-Admission (Discharged) from 10/20/2021 in [facility]  30 Day Unplanned Readmission Risk Score (%) 22.42 Filed at 10/26/2021 1200       This score is the patient's risk of an unplanned readmission within 30 days of being discharged (0 -100%). The score is based on dignosis, age, lab data, medications, orders, and past utilization.   Low:  0-14.9   Medium: 15-21.9   High: 22-29.9   Extreme: 30 and above         Time spent: 47mns, more than 50% time spent on coordination of care.   Recommendations for Outpatient Follow-up:  F/u with PCP within a week  for hospital discharge follow up, repeat cbc/bmp at follow up F/u with cardiology Dr MDomenic Polite cardiology to arrange ziopatch and repeat echo Resume home health Outpatient palliative care referral make after prompt by epic     Discharge Diagnoses:  Active Hospital Problems   Diagnosis Date Noted   Syncope 07/05/2017   Lightheadedness    Stage 3a chronic kidney disease ([facility] 08/03/2021   Chronic combined systolic (congestive) and diastolic (congestive) heart failure ([facility] 05/09/2018   COPD exacerbation ([facility] 07/05/2017   Carotid artery disease ([facility] 06/25/2017   Essential hypertension 04/02/2017   HFmrEF (heart failure with mildly reduced EF) 04/02/2017   Tobacco abuse 04/02/2017    Resolved Hospital Problems  No resolved problems to display.    Discharge Condition: stable  Diet recommendation: heart healthy  Filed Weights   11/07/21 1200 11/08/21 1259 11/09/21 0500  Weight: 63.4 kg 65.5 kg 66.2 kg    History of present illness: ( per admitting MD Dr [place] Patient coming from: Home   I have personally briefly [place] [facility] [place] [facility]  Chief Complaint: Chest  pains x2, passed out x1   HPI: PGraves [name] a 66y.o. male with medical history significant of CAD with known RCA stenosis, probably ischemic cardiomyopathy with chronic HFrEF LVEF 30% 2023, CKD stage IIIb, HTN, HLD, COPD, chronic normocytic anemia, head and neck cancer status post partial face and nose resection and reconstruction, presented with recurrent chest pains and syncope.   First episode [place], patient started to have squeezing-like chest pain centrally located, 7-8/10, associated with sweating and shortness of breath.  Episode lasted few minutes and resolved by itself he did not take any intervention, denies any exacerbation or relieving factors.  This morning, he was at home, then he had chest pain similar to yesterday's, this time he started to feel lightheadedness and then passed out on the floor, about 15-20 minutes later he woke up himself and chest pain was gone.  He denies any palpitations, no loss control of urine or bowel movement, no tongue biting.  2 weeks ago, patient was admitted to hospital for NSTEMI, cardiac catheter showed stenosis, RCA up to 70% stenosis, decision was made to pursue conservative management with optimization of his CHF meds, patient reported he has been compliant with all his CHF/BP meds.   ED Course: No tachycardia no hypotension.  Troponin is slightly elevated 77> 79.  EKG showed chronic LVH frequent PVCs and chronic ST T changes on V4-V6.   Cardiology consulted and recommended increase patient's Imdur from 30 mg daily to 60 mg daily.  Hospital Course:  Principal Problem:   Syncope Active Problems:   Essential hypertension   Tobacco abuse   HFmrEF (heart failure with mildly reduced EF)   Carotid artery disease (HCC)   COPD exacerbation (HCC)   Chronic combined systolic (congestive) and diastolic (congestive) heart failure (HCC)   Stage 3a chronic kidney disease (HCC)   Lightheadedness    chest  pain/syncope -Troponin elevation pattern is flat -V/Q scan negative for PE -tele with pvc and NSVT -seen by cardiology recommend increase imdur, outpatient ziopatch and repeat echo in three months     Chronic ischemic cardiomyopathy and chronic HFrEF -Euvolemic, continue daily Lasix --Continue Coreg, Imdur dosage increased per initial cardiology recs   CAD with recent NSTEMI -Aspirin Plavix statin  HTN -As above   CKD stage IIIb -Euvolemic, creatinine level stable, continue home dose of diuresis     Recent hemicolectomy - no invasive carcinoma on path  H/o head neck cancer , s/p resection, XRT F/u with ENT outpatient    Discharge Exam: BP (!) 147/79   Pulse 62   Temp 98 F (36.7 C) (Oral)   Resp 18   Ht 6' (1.829 m)   Wt 66.2 kg   SpO2 100%   BMI 19.79 kg/m   General: NAD Cardiovascular: RRR Respiratory: normal respiratory effort     Discharge Instructions     Amb Referral to Palliative Care   Complete by: As directed    Diet - low sodium heart healthy   Complete by: As directed    Increase activity slowly   Complete by: As directed       Allergies as of 11/09/2021   No Known Allergies      Medication List     TAKE these medications    albuterol 108 (90 Base) MCG/ACT inhaler Commonly known as: VENTOLIN HFA Inhale 2 puffs into the lungs every 6 (six) hours as needed for wheezing or shortness of breath.   amLODipine 5 MG tablet Commonly known as: NORVASC Take 5 mg by mouth daily.   aspirin EC 81 MG tablet Take 81 mg by mouth daily.   atorvastatin 80 MG tablet Commonly known as: LIPITOR Take 1 tablet (80 mg total) by mouth every evening.   carvedilol 12.5 MG tablet Commonly known as: COREG Take 1 tablet (12.5 mg total) by mouth 2 (two) times daily with a meal.   clopidogrel 75 MG tablet Commonly known as: PLAVIX Take 1 tablet (75 mg total) by mouth daily.   ergocalciferol 1.25 MG (50000 UT) capsule Commonly known as: VITAMIN  D2 Take 1 capsule (50,000 Units total) by mouth once a week.   furosemide 40 MG tablet Commonly known as: LASIX Take 1 tablet (40 mg total) by mouth daily.   isosorbide mononitrate 60 MG 24 hr tablet Commonly known as: IMDUR Take 1 tablet (60 mg total) by mouth every evening. What changed:  medication strength how much to take   potassium chloride SA 20 MEQ tablet Commonly known as: KLOR-CON M Take 1 tablet (20 mEq total) by mouth 2 (two) times daily.   Repatha SureClick 952 MG/ML Soaj Generic drug: Evolocumab Inject 1 pen into the skin every 14 (fourteen) days.       No Known Allergies  Follow-up Information     Satira Sark, MD Follow up.   Specialty: Cardiology Why: office will call with monitor set up and appointment Contact information: [address] [place] [address] [phone]  The results of significant diagnostics from this hospitalization (including imaging, microbiology, ancillary and laboratory) are listed below for reference.    Significant Diagnostic Studies: DG Chest 2 View  Result Date: 11/09/2021 CLINICAL DATA:  Dyspnea EXAM: CHEST - 2 VIEW COMPARISON:  None Available. FINDINGS: Enlargement of cardiac silhouette. Mediastinal contours and pulmonary vascularity normal. Atherosclerotic calcification aorta. Lungs emphysematous but clear. No pulmonary infiltrate, pleural effusion, or pneumothorax. Bones demineralized. IMPRESSION: Enlargement of cardiac silhouette. Emphysematous changes without infiltrate. Aortic Atherosclerosis (ICD10-I70.0) and Emphysema (ICD10-J43.9). Electronically Signed   By: Lavonia Dana M.D.   On: 11/09/2021 12:04   NM Pulmonary Perfusion  Result Date: 11/09/2021 CLINICAL DATA:  Syncope, shortness of breath, elevated D-dimer EXAM: NUCLEAR MEDICINE PERFUSION LUNG SCAN TECHNIQUE: Perfusion images were obtained in multiple projections after intravenous injection of radiopharmaceutical. Ventilation  scans intentionally deferred if perfusion scan and chest x-ray adequate for interpretation during COVID 19 epidemic. RADIOPHARMACEUTICALS:  4.1 mCi Tc-30mMAA IV COMPARISON:  Chest radiograph 11/09/2021 FINDINGS: Normal perfusion lung scan. Enlargement of cardiac silhouette incidentally noted. No perfusion defects. IMPRESSION: Pulmonary embolism absent Electronically Signed   By: MLavonia DanaM.D.   On: 11/09/2021 12:03   DG Chest Port 1 View  Result Date: 11/07/2021 CLINICAL DATA:  Chest pain EXAM: PORTABLE CHEST 1 VIEW COMPARISON:  Multiple chest x-rays, most recently October 20, 2021 FINDINGS: Cardiomegaly, similar to prior. Stable mediastinal contours. No focal pulmonary opacity. No large pleural effusion or pneumothorax. No acute osseous abnormality. The visualized upper abdomen is unremarkable. IMPRESSION: 1. Cardiomegaly. 2. No acute pulmonary abnormality. Electronically Signed   By: MBeryle FlockM.D.   On: 11/07/2021 12:15   CARDIAC CATHETERIZATION  Result Date: 10/22/2021   Mid RCA lesion is 70% stenosed.   Acute Mrg lesion is 80% stenosed. 1.  Mild to moderate obstructive coronary artery disease that is outweighed by the patient's degree of LV dysfunction.  There is a significant bifurcation lesion of the right coronary artery and large RV marginal branch.  This is moderately calcified and relatively tortuous.  Given the patient's lack of chest pain, medical therapy should be pursued. 2.  Cardiac output of 6.3 L/min, cardiac index of 3.3 L/min/m, mean RA pressure of 16 mmHg, mean wedge pressure of 21 mmHg, and LVEDP of 21 mmHg. Recommendation: Goal-directed medical therapy with augmentation of diuretics; consider dual antiplatelet therapy for 1 year for medical treatment of acute coronary syndrome.   ECHOCARDIOGRAM COMPLETE  Result Date: 10/21/2021    ECHOCARDIOGRAM REPORT   Patient Name:   PPHILOPATEER STRINEDate of Exam: 10/21/2021 Medical Rec #:  [id number]    Height:       72.0 in Accession  #:    [id number]   Weight:       167.0 lb Date of Birth:  504-Jun-1957    BSA:          1.973 m Patient Age:    60years      BP:           136/54 mmHg Patient Gender: M             HR:           62 bpm. [facility]  [facility]: 2D Echo, Cardiac Doppler and Color Doppler Indications:    Dyspnea  History:        Patient has prior history of Echocardiogram examinations, most  recent 12/05/2017. CHF, CAD, Abnormal ECG, COPD,                 Signs/Symptoms:Murmur and Syncope; Risk Factors:Hypertension,                 Diabetes and Current Smoker.  Sonographer:    Wenda Low Referring Phys: [id number] [facility] IMPRESSIONS  1. LV function is depressed with diffuse hypokinesis in the lateral and inferiror walls Compared to echo from 2019, LVEF is worse. . Left ventricular ejection fraction, by estimation, is 30%. The left ventricle has moderately decreased function. The left ventricular internal cavity size was severely dilated. There is mild left ventricular hypertrophy. Left ventricular diastolic parameters are consistent with Grade III diastolic dysfunction (restrictive). Elevated left atrial pressure.  2. Right ventricular systolic function is low normal. The right ventricular size is normal. There is normal pulmonary artery systolic pressure.  3. Left atrial size was severely dilated.  4. Right atrial size was moderately dilated.  5. A small pericardial effusion is present. The pericardial effusion is circumferential.  6. Mild mitral valve regurgitation.  7. The aortic valve is tricuspid. Aortic valve regurgitation is not visualized. Aortic valve sclerosis is present, with no evidence of aortic valve stenosis.  8. The inferior vena cava dilated. FINDINGS  Left Ventricle: LV function is depressed with diffuse hypokinesis in the lateral and inferiror walls Compared to echo from 2019, LVEF is worse. Left ventricular ejection fraction, by estimation, is 30%. The left ventricle has  moderately decreased function. The left ventricular internal cavity size was severely dilated. There is mild left ventricular hypertrophy. Left ventricular diastolic parameters are consistent with Grade III diastolic dysfunction (restrictive). Elevated left atrial pressure. Right Ventricle: The right ventricular size is normal. Right vetricular wall thickness was not assessed. Right ventricular systolic function is low normal. There is normal pulmonary artery systolic pressure. The tricuspid regurgitant velocity is 1.60 m/s, and with an assumed right atrial pressure of 8 mmHg, the estimated right ventricular systolic pressure is 55.7 mmHg. Left Atrium: Left atrial size was severely dilated. Right Atrium: Right atrial size was moderately dilated. Pericardium: A small pericardial effusion is present. The pericardial effusion is circumferential. Mitral Valve: There is mild thickening of the mitral valve leaflet(s). Mild mitral annular calcification. Mild mitral valve regurgitation. MV peak gradient, 6.9 mmHg. The mean mitral valve gradient is 2.0 mmHg. Tricuspid Valve: The tricuspid valve is normal in structure. Tricuspid valve regurgitation is mild. Aortic Valve: The aortic valve is tricuspid. Aortic valve regurgitation is not visualized. Aortic valve sclerosis is present, with no evidence of aortic valve stenosis. Aortic valve mean gradient measures 3.0 mmHg. Aortic valve peak gradient measures 7.2  mmHg. Aortic valve area, by VTI measures 2.65 cm. Pulmonic Valve: The pulmonic valve was normal in structure. Pulmonic valve regurgitation is not visualized. Aorta: The aortic root and ascending aorta are structurally normal, with no evidence of dilitation. Venous: The inferior vena cava dilated. IAS/Shunts: No atrial level shunt detected by color flow Doppler.  LEFT VENTRICLE PLAX 2D LVIDd:         6.50 cm      Diastology LVIDs:         5.60 cm      LV e' medial:   3.09 cm/s LV PW:         1.10 cm      LV E/e' medial:  37.2 LV IVS:        1.20 cm LVOT diam:  2.40 cm LV SV:         72 LV SV Index:   37 LVOT Area:     4.52 cm  LV Volumes (MOD) LV vol d, MOD A2C: 182.0 ml LV vol d, MOD A4C: 179.0 ml LV vol s, MOD A2C: 132.0 ml LV vol s, MOD A4C: 125.0 ml LV SV MOD A2C:     50.0 ml LV SV MOD A4C:     179.0 ml LV SV MOD BP:      49.6 ml RIGHT VENTRICLE RV Basal diam:  5.50 cm RV Mid diam:    3.70 cm RV S prime:     7.62 cm/s TAPSE (M-mode): 2.2 cm LEFT ATRIUM              Index        RIGHT ATRIUM           Index LA diam:        5.40 cm  2.74 cm/m   RA Area:     26.00 cm LA Vol (A2C):   144.0 ml 72.97 ml/m  RA Volume:   94.10 ml  47.68 ml/m LA Vol (A4C):   132.0 ml 66.89 ml/m LA Biplane Vol: 143.0 ml 72.46 ml/m  AORTIC VALVE                    PULMONIC VALVE AV Area (Vmax):    3.41 cm     PV Vmax:       0.81 m/s AV Area (Vmean):   2.91 cm     PV Peak grad:  2.7 mmHg AV Area (VTI):     2.65 cm AV Vmax:           134.00 cm/s AV Vmean:          82.700 cm/s AV VTI:            0.273 m AV Peak Grad:      7.2 mmHg AV Mean Grad:      3.0 mmHg LVOT Vmax:         101.00 cm/s LVOT Vmean:        53.200 cm/s LVOT VTI:          0.160 m LVOT/AV VTI ratio: 0.59  AORTA Ao Root diam: 3.80 cm Ao Asc diam:  3.40 cm MITRAL VALVE                TRICUSPID VALVE MV Area (PHT): 3.58 cm     TR Peak grad:   10.2 mmHg MV Area VTI:   1.86 cm     TR Vmax:        160.00 cm/s MV Peak grad:  6.9 mmHg MV Mean grad:  2.0 mmHg     SHUNTS MV Vmax:       1.31 m/s     Systemic VTI:  0.16 m MV Vmean:      55.6 cm/s    Systemic Diam: 2.40 cm MV Decel Time: 212 msec MV E velocity: 115.00 cm/s MV A velocity: 36.80 cm/s MV E/A ratio:  3.13 Dorris Carnes MD Electronically signed by Dorris Carnes MD Signature Date/Time: 10/21/2021/2:57:36 PM    Final    CT Abdomen Pelvis Wo Contrast  Result Date: 10/20/2021 CLINICAL DATA:  Colorectal carcinoma. Elevated liver function. Metastatic disease evaluation. * Tracking Code: BO * EXAM: CT ABDOMEN AND PELVIS WITHOUT CONTRAST  TECHNIQUE: Multidetector CT imaging of the abdomen and pelvis was performed following the standard protocol without IV  contrast. RADIATION DOSE REDUCTION: This exam was performed according to the departmental dose-optimization program which includes automated exposure control, adjustment of the mA and/or kV according to patient size and/or use of iterative reconstruction technique. COMPARISON:  CT 07/05/2021, MRI 08/06/2021 FINDINGS: Lower chest: Moderate RIGHT pleural effusion and small LEFT pleural effusion. Hepatobiliary: No suspicious lesion within liver with limitations of noncontrast exam. Simple cyst within the central liver unchanged from comparison CT. Large periphery calcified gallstone within the lumen the gallbladder. No acute inflammation. Pancreas: Coarse calcifications through the distal pancreas leading up to the head of the pancreas. Stable duct dilatation in tail the pancreas. No change from comparison MRI or CT. Spleen: Normal spleen Adrenals/urinary tract: Adrenal glands and kidneys are normal. The ureters and bladder normal. Stomach/Bowel: Stomach, duodenum small-bowel normal. Post RIGHT hemicolectomy. LEFT colon normal. Vascular/Lymphatic: Calcification abdominal aorta. Fusiform dilatation of the for abdominal aorta to 3.1 cm. Reproductive: TURP defect within the prostate Other: Midline surgical wound with mild thickening at the skin surface measuring 13 mm. Musculoskeletal: No aggressive osseous lesion. IMPRESSION: 1. New bilateral small moderate pleural effusions, RIGHT greater than LEFT. 2. No evidence of hepatic metastasis on noncontrast CT. 3. Large gallstone without evidence acute cholecystitis. 4. Post RIGHT hemicolectomy without complicating features. 5. Thickening tissue at the skin surface along the ventral abdominal wall is likely scar tissue. 6. 3.1 cm abdominal aortic aneurysm. Recommend follow-up every 3 years. Reference: J Am Coll Radiol 3664;40:347-425. Electronically Signed    By: Suzy Bouchard M.D.   On: 10/20/2021 14:17   DG Chest Port 1 View  Result Date: 10/20/2021 CLINICAL DATA:  Shortness of breath EXAM: PORTABLE CHEST 1 VIEW COMPARISON:  08/13/2021 FINDINGS: Stable cardiomediastinal contours. Pulmonary vascular congestion with diffusely prominent interstitial markings bilaterally. No appreciable pleural fluid collection. No pneumothorax. IMPRESSION: Findings of CHF with interstitial edema. Electronically Signed   By: Davina Poke D.O.   On: 10/20/2021 11:02    Microbiology: No results found for this or any previous visit (from the past 240 hour(s)).   Labs: Basic Metabolic Panel: Recent Labs  Lab 11/07/21 1206 11/08/21 0454 11/09/21 0315  NA 139 138 140  K 3.8 3.6 3.5  CL 110 107 106  CO2 '23 24 25  '$ GLUCOSE 100* 136* 118*  BUN 25* 30* 23  CREATININE 1.56* 1.85* 1.72*  CALCIUM 8.5* 8.4* 8.6*  MG  --  2.0  --   PHOS  --  3.6  --    Liver Function Tests: Recent Labs  Lab 11/07/21 1206  AST 40  ALT 30  ALKPHOS 143*  BILITOT 1.1  PROT 6.2*  ALBUMIN 3.0*   No results for input(s): "LIPASE", "AMYLASE" in the last 168 hours. No results for input(s): "AMMONIA" in the last 168 hours. CBC: Recent Labs  Lab 11/07/21 1206  WBC 9.6  NEUTROABS 7.2  HGB 10.3*  HCT 33.6*  MCV 85.3  PLT 286   Cardiac Enzymes: No results for input(s): "CKTOTAL", "CKMB", "CKMBINDEX", "TROPONINI" in the last 168 hours. BNP: BNP (last 3 results) Recent Labs    10/20/21 1132 11/07/21 1206  BNP >4,500.0* >4,500.0*    ProBNP (last 3 results) No results for input(s): "PROBNP" in the last 8760 hours.  CBG: Recent Labs  Lab 11/08/21 0453 11/09/21 0458 11/09/21 1202  GLUCAP 126* 107* 106*    FURTHER DISCHARGE INSTRUCTIONS:   Get Medicines reviewed and adjusted: Please take all your medications with you for your next visit with your Primary MD   Laboratory/radiological data:  Please request your Primary MD to go over all hospital tests and  procedure/radiological results at the follow up, please ask your Primary MD to get all Hospital records sent to his/her office.   In some cases, they will be blood work, cultures and biopsy results pending at the time of your discharge. Please request that your primary care M.D. goes through all the records of your hospital data and follows up on these results.   Also Note the following: If you experience worsening of your admission symptoms, develop shortness of breath, life threatening emergency, suicidal or homicidal thoughts you must seek medical attention immediately by calling 911 or calling your MD immediately  if symptoms less severe.   You must read complete instructions/literature along with all the possible adverse reactions/side effects for all the Medicines you take and that have been prescribed to you. Take any new Medicines after you have completely understood and accpet all the possible adverse reactions/side effects.    Do not drive when taking Pain medications or sleeping medications (Benzodaizepines)   Do not take more than prescribed Pain, Sleep and Anxiety Medications. It is not advisable to combine anxiety,sleep and pain medications without talking with your primary care practitioner   Special Instructions: If you have smoked or chewed Tobacco  in the last 2 yrs please stop smoking, stop any regular Alcohol  and or any Recreational drug use.   Wear Seat belts while driving.   Please note: You were cared for by a hospitalist during your hospital stay. Once you are discharged, your primary care physician will handle any further medical issues. Please note that NO REFILLS for any discharge medications will be authorized once you are discharged, as it is imperative that you return to your primary care physician (or establish a relationship with a primary care physician if you do not have one) for your post hospital discharge needs so that they can reassess your need for  medications and monitor your lab values.     Signed:  Florencia Reasons MD, PhD, FACP  [facility] 11/09/2021, 2:05 PM

## 2021-11-09 NOTE — Progress Notes (Signed)
Michael [name] to be discharged Home per MD order. Discussed prescriptions and follow up appointments with the patient. Prescriptions given to patient; medication from [facility] delivered and explained in detail. Patient verbalized understanding.  Skin clean, dry and intact without evidence of skin break down, no evidence of skin tears noted. IV catheter discontinued intact. Site without signs and symptoms of complications. Dressing and pressure applied. Pt denies pain at the site currently. No complaints noted.  Patient free of lines, drains, and wounds.   An After Visit Summary (AVS) was printed and given to the patient. Patient escorted via wheelchair, and discharged home via private auto.  Amaryllis Dyke, RN

## 2021-11-13 ENCOUNTER — Other Ambulatory Visit: Payer: Self-pay

## 2021-11-13 ENCOUNTER — Emergency Department (HOSPITAL_COMMUNITY): Payer: Medicare Other

## 2021-11-13 ENCOUNTER — Encounter (HOSPITAL_COMMUNITY): Payer: Self-pay | Admitting: Emergency Medicine

## 2021-11-13 ENCOUNTER — Emergency Department (HOSPITAL_COMMUNITY)
Admission: EM | Admit: 2021-11-13 | Discharge: 2021-11-13 | Disposition: A | Discharge status: Home / Self Care | Payer: Medicare Other | Attending: Emergency Medicine | Admitting: Emergency Medicine

## 2021-11-13 DIAGNOSIS — I129 Hypertensive chronic kidney disease with stage 1 through stage 4 chronic kidney disease, or unspecified chronic kidney disease: Secondary | ICD-10-CM | POA: Diagnosis not present

## 2021-11-13 DIAGNOSIS — R42 Dizziness and giddiness: Secondary | ICD-10-CM | POA: Diagnosis not present

## 2021-11-13 DIAGNOSIS — Q441 Other congenital malformations of gallbladder: Secondary | ICD-10-CM | POA: Insufficient documentation

## 2021-11-13 DIAGNOSIS — R0902 Hypoxemia: Secondary | ICD-10-CM | POA: Diagnosis not present

## 2021-11-13 DIAGNOSIS — J449 Chronic obstructive pulmonary disease, unspecified: Secondary | ICD-10-CM | POA: Insufficient documentation

## 2021-11-13 DIAGNOSIS — Z7951 Long term (current) use of inhaled steroids: Secondary | ICD-10-CM | POA: Diagnosis not present

## 2021-11-13 DIAGNOSIS — Z8589 Personal history of malignant neoplasm of other organs and systems: Secondary | ICD-10-CM | POA: Diagnosis not present

## 2021-11-13 DIAGNOSIS — Z7982 Long term (current) use of aspirin: Secondary | ICD-10-CM | POA: Diagnosis not present

## 2021-11-13 DIAGNOSIS — K8689 Other specified diseases of pancreas: Secondary | ICD-10-CM | POA: Diagnosis not present

## 2021-11-13 DIAGNOSIS — R0689 Other abnormalities of breathing: Secondary | ICD-10-CM | POA: Diagnosis not present

## 2021-11-13 DIAGNOSIS — R55 Syncope and collapse: Secondary | ICD-10-CM | POA: Diagnosis not present

## 2021-11-13 DIAGNOSIS — I2510 Atherosclerotic heart disease of native coronary artery without angina pectoris: Secondary | ICD-10-CM | POA: Diagnosis not present

## 2021-11-13 DIAGNOSIS — Z85841 Personal history of malignant neoplasm of brain: Secondary | ICD-10-CM | POA: Diagnosis not present

## 2021-11-13 DIAGNOSIS — R079 Chest pain, unspecified: Secondary | ICD-10-CM

## 2021-11-13 DIAGNOSIS — N183 Chronic kidney disease, stage 3 unspecified: Secondary | ICD-10-CM | POA: Diagnosis not present

## 2021-11-13 DIAGNOSIS — Z79899 Other long term (current) drug therapy: Secondary | ICD-10-CM | POA: Insufficient documentation

## 2021-11-13 DIAGNOSIS — R0789 Other chest pain: Secondary | ICD-10-CM | POA: Diagnosis not present

## 2021-11-13 DIAGNOSIS — R6889 Other general symptoms and signs: Secondary | ICD-10-CM | POA: Diagnosis not present

## 2021-11-13 DIAGNOSIS — Z743 Need for continuous supervision: Secondary | ICD-10-CM | POA: Diagnosis not present

## 2021-11-13 DIAGNOSIS — R0602 Shortness of breath: Secondary | ICD-10-CM | POA: Diagnosis not present

## 2021-11-13 LAB — CBC
HCT: 34.2 % — ABNORMAL LOW (ref 39.0–52.0)
Hemoglobin: 10.4 g/dL — ABNORMAL LOW (ref 13.0–17.0)
MCH: 26.5 pg (ref 26.0–34.0)
MCHC: 30.4 g/dL (ref 30.0–36.0)
MCV: 87 fL (ref 80.0–100.0)
Platelets: 326 10*3/uL (ref 150–400)
RBC: 3.93 MIL/uL — ABNORMAL LOW (ref 4.22–5.81)
RDW: 19.1 % — ABNORMAL HIGH (ref 11.5–15.5)
WBC: 7.7 10*3/uL (ref 4.0–10.5)
nRBC: 0 % (ref 0.0–0.2)

## 2021-11-13 LAB — BASIC METABOLIC PANEL
Anion gap: 6 (ref 5–15)
BUN: 29 mg/dL — ABNORMAL HIGH (ref 8–23)
CO2: 25 mmol/L (ref 22–32)
Calcium: 8.4 mg/dL — ABNORMAL LOW (ref 8.9–10.3)
Chloride: 107 mmol/L (ref 98–111)
Creatinine, Ser: 1.56 mg/dL — ABNORMAL HIGH (ref 0.61–1.24)
GFR, Estimated: 48 mL/min — ABNORMAL LOW (ref >60)
Glucose, Bld: 103 mg/dL — ABNORMAL HIGH (ref 70–99)
Potassium: 4 mmol/L (ref 3.5–5.1)
Sodium: 138 mmol/L (ref 135–145)

## 2021-11-13 LAB — TROPONIN I (HIGH SENSITIVITY)
Troponin I (High Sensitivity): 70 ng/L — ABNORMAL HIGH (ref <18)
Troponin I (High Sensitivity): 62 ng/L — ABNORMAL HIGH (ref <18)

## 2021-11-13 LAB — BRAIN NATRIURETIC PEPTIDE: B Natriuretic Peptide: 2644 pg/mL — ABNORMAL HIGH (ref 0.0–100.0)

## 2021-11-13 LAB — PROTIME-INR
INR: 1.1 (ref 0.8–1.2)
Prothrombin Time: 13.6 seconds (ref 11.4–15.2)

## 2021-11-13 MED ORDER — IOHEXOL 350 MG/ML SOLN
75.0000 mL | Freq: Once | INTRAVENOUS | Status: AC | PRN
  Administered 2021-11-13: 75 mL via INTRAVENOUS

## 2021-11-13 MED ORDER — ONDANSETRON HCL 4 MG/2ML IJ SOLN
4.0000 mg | Freq: Once | INTRAMUSCULAR | Status: AC
  Administered 2021-11-13: 4 mg via INTRAVENOUS
  Filled 2021-11-13: qty 2

## 2021-11-13 NOTE — Discharge Instructions (Addendum)
Your work-up in the ER included a scan of your lungs to make sure you do not have blood clots in your lungs.  We do not see signs of blood clots.  However we did find some incidental findings.  You have a calcified gallbladder.  This could be causing some of the pain, including sharp pains in the right upper side, particularly after you eat fatty or greasy foods.  Please read over the attached instructions.  This includes safer foods to eat and avoid having gallbladder attacks.  I would also recommend that you schedule a follow-up appointment with a general surgeon.  They can discuss next steps regarding your gallbladder.  Sometimes these do require surgery, and sometimes they do not.  The pancreas also has some calcifications.  This is likely an incidental finding.  However your primary care doctor's office should be aware of this.  Please follow-up with your primary care doctor as well for this visit today.  You may need another, dedicated CT scan of the abdomen, or an MRI of your pancreas ordered as an outpatient.  Finally, and most importantly, you should contact your cardiology office today.  Because of your heart history, it is important that they are updated about your visit to the ER for any chest pain episodes.  Please call their office tomorrow or Monday to arrange for follow-up.

## 2021-11-13 NOTE — ED Provider Notes (Signed)
[facility] EMERGENCY DEPARTMENT Provider Note   CSN: [id number] Arrival date & time: 11/13/21  1202     History  Chief Complaint  Patient presents with   Chest Pain    Michael [name] is a 66 y.o. male with a history of coronary disease, hypertension, hyperlipidemia, likely ischemic cardiomyopathy, stage III kidney disease, COPD, head and neck cancer status post partial facial resection and reconstruction, presenting to Emergency Department EMS with sudden onset chest pain.  The patient reports he was eating a meal earlier today, the middle of the meal he had a sudden onset of pain in the middle of his chest, and he began to feel extremely lightheaded, and felt like what he could not move any part of my body".  He said this feeling lasted a few minutes.  He took 1 baby aspirin at home.  Fire rescue reported the patient was hypoxic with a pulse ox of 89% on their arrival.  He was placed on supplemental oxygen.  EMS arrived and given additional 3 baby aspirin.  Patient was also given 1 sublingual nitro by EMS.    Upon arrival he reports chest pain is completely resolved.  He feels completely back to normal.  He says that the symptoms were quite different than the anginal symptoms he has been having the past several months, for which she was in the hospital.  He is prescribed Imdur but has been out of this for "nearly a month".  There was report of transient hypoxia, patient 89% upon arrival of fire rescue on scene.  He was placed on supplemental oxygen and brought to the ED, he was weaned back to room air rapidly.  Per medical record review, the patient was discharged from the hospital on 11/09/21, 4 days ago, after readmission for chest pain and syncope.  2 weeks prior to that he been admitted to the hospital for an NSTEMI, with cardiac catheterization performed on 10/22/21, showing 70% stenosis of the mid RCA, with 80% stenosis of right ventricular marginal lesion, near the bifurcation with the  right coronary artery.  The recommendations by the cardiologist were for goal-directed medical therapy.  Per Dr Collier Salina jordan's note from cardiology, the RCA lesion was not amenable for PCI from an anatomic standpoint.  He had a nuclear medicine perfusion study performed October 10 which was negative for pulmonary embolism.  Patient's last echocardiogram was approximately 3 weeks ago, September 21, showing diffuse hypokinesis of the left ventricle lateral and inferior walls, EF 30%.  Cardiology recommended on recent hospitalization 1 week ago repeat echocardiogram in 3 months.  If his EF remains depressed, consideration of referral to EP for ICD.  HPI     Home Medications Prior to Admission medications   Medication Sig Start Date End Date Taking? Authorizing Provider  albuterol (VENTOLIN HFA) 108 (90 Base) MCG/ACT inhaler Inhale 2 puffs into the lungs every 6 (six) hours as needed for wheezing or shortness of breath. 08/14/21  Yes Pappayliou, Barnetta Chapel A, DO  amLODipine (NORVASC) 5 MG tablet Take 5 mg by mouth daily. 10/12/21  Yes [provider]  aspirin EC 81 MG tablet Take 81 mg by mouth daily.   Yes [provider]  atorvastatin (LIPITOR) 80 MG tablet Take 1 tablet (80 mg total) by mouth every evening. 08/10/21  Yes Swinyer, Lanice Schwab, NP  Budeson-Glycopyrrol-Formoterol (BREZTRI AEROSPHERE) 160-9-4.8 MCG/ACT AERO Inhale 2 puffs into the lungs 2 (two) times daily.   Yes [provider]  carvedilol (COREG) 12.5 MG tablet  Take 1 tablet (12.5 mg total) by mouth 2 (two) times daily with a meal. 10/26/21  Yes Aline August, MD  clopidogrel (PLAVIX) 75 MG tablet Take 1 tablet (75 mg total) by mouth daily. 10/26/21  Yes Aline August, MD  Evolocumab (REPATHA SURECLICK) 010 MG/ML SOAJ Inject 1 pen into the skin every 14 (fourteen) days. 03/09/21  Yes Satira Sark, MD  furosemide (LASIX) 40 MG tablet Take 1 tablet (40 mg total) by mouth daily. 10/26/21 11/25/21 Yes Aline August, MD  isosorbide mononitrate (IMDUR) 60 MG 24 hr tablet Take 1 tablet (60 mg total) by mouth every evening. 11/09/21  Yes Florencia Reasons, MD  metoprolol succinate (TOPROL-XL) 25 MG 24 hr tablet Take 25 mg by mouth every evening.   Yes [provider]  potassium chloride SA (KLOR-CON M) 20 MEQ tablet Take 1 tablet (20 mEq total) by mouth 2 (two) times daily. 10/26/21  Yes Aline August, MD  ergocalciferol (VITAMIN D2) 1.25 MG (50000 UT) capsule Take 1 capsule (50,000 Units total) by mouth once a week. Patient not taking: Reported on 10/20/2021 08/26/21   Harriett Rush, PA-C      Allergies    Patient has no known allergies.    Review of Systems   Review of Systems  Physical Exam Updated Vital Signs BP 123/71   Pulse 68   Temp 97.8 F (36.6 C) (Oral)   Resp (!) 22   SpO2 98%  Physical Exam Constitutional:      General: He is not in acute distress. HENT:     Head: Normocephalic and atraumatic.  Eyes:     Conjunctiva/sclera: Conjunctivae normal.     Pupils: Pupils are equal, round, and reactive to light.  Cardiovascular:     Rate and Rhythm: Normal rate and regular rhythm.  Pulmonary:     Effort: Pulmonary effort is normal. No respiratory distress.  Abdominal:     General: There is no distension.     Tenderness: There is no abdominal tenderness.  Skin:    General: Skin is warm and dry.  Neurological:     General: No focal deficit present.     Mental Status: He is alert. Mental status is at baseline.  Psychiatric:        Mood and Affect: Mood normal.        Behavior: Behavior normal.     ED Results / Procedures / Treatments   Labs (all labs ordered are listed, but only abnormal results are displayed) Labs Reviewed  BASIC METABOLIC PANEL - Abnormal; Notable for the following components:      Result Value   Glucose, Bld 103 (*)    BUN 29 (*)    Creatinine, Ser 1.56 (*)    Calcium 8.4 (*)    GFR, Estimated 48 (*)    All other components within normal  limits  CBC - Abnormal; Notable for the following components:   RBC 3.93 (*)    Hemoglobin 10.4 (*)    HCT 34.2 (*)    RDW 19.1 (*)    All other components within normal limits  BRAIN NATRIURETIC PEPTIDE - Abnormal; Notable for the following components:   B Natriuretic Peptide 2,644.0 (*)    All other components within normal limits  TROPONIN I (HIGH SENSITIVITY) - Abnormal; Notable for the following components:   Troponin I (High Sensitivity) 70 (*)    All other components within normal limits  TROPONIN I (HIGH SENSITIVITY) - Abnormal; Notable for the following components:  Troponin I (High Sensitivity) 62 (*)    All other components within normal limits  PROTIME-INR    EKG EKG Interpretation  Date/Time:  Saturday November 13 2021 12:11:51 EDT Ventricular Rate:  68 PR Interval:  280 QRS Duration: 115 QT Interval:  452 QTC Calculation: 481 R Axis:   76 Text Interpretation: Sinus rhythm Prolonged PR interval Nonspecific intraventricular conduction delay Abnormal T, lateral leads, noted on prior tracing Nov 07 2021.  No STEMI Confirmed by Octaviano Glow [phone]) on 11/13/2021 12:18:55 PM  Radiology CT Angio Chest PE W and/or Wo Contrast  Result Date: 11/13/2021 CLINICAL DATA:  Hypoxia and syncope. EXAM: CT ANGIOGRAPHY CHEST WITH CONTRAST TECHNIQUE: Multidetector CT imaging of the chest was performed using the standard protocol during bolus administration of intravenous contrast. Multiplanar CT image reconstructions and MIPs were obtained to evaluate the vascular anatomy. RADIATION DOSE REDUCTION: This exam was performed according to the departmental dose-optimization program which includes automated exposure control, adjustment of the mA and/or kV according to patient size and/or use of iterative reconstruction technique. CONTRAST:  70m OMNIPAQUE IOHEXOL 350 MG/ML SOLN COMPARISON:  Prior CTA of the chest on 04/02/2017. CT of the abdomen and pelvis on 07/30/2018 FINDINGS:  Cardiovascular: The pulmonary arteries are adequately opacified. There is no evidence of pulmonary embolism. Central pulmonary arteries are of top-normal in caliber with the main pulmonary artery measuring 3 cm. The thoracic aorta demonstrates atherosclerosis without evidence of aneurysmal disease. Moderate cardiac enlargement with significant left ventricular dilatation. Diffuse calcified coronary artery plaque in a 3 vessel distribution. Trace pericardial fluid. Mediastinum/Nodes: No enlarged mediastinal, hilar, or axillary lymph nodes. Thyroid gland, trachea, and esophagus demonstrate no significant findings. Lungs/Pleura: There is no evidence of pulmonary edema, consolidation, pneumothorax, nodule or pleural fluid. Upper Abdomen: Stable small hepatic cysts. Enlargement rim calcified gallstone versus calcification in the gallbladder wall. Elective follow-up recommended as a porcelain gallbladder is an increased risk for development of future gallbladder carcinoma. New calcifications in the region of the pancreatic head with some associated suggestion pancreatic atrophy since the prior abdominal CT. There may be some pancreatic ductal dilatation as well but this is not well evaluated on the relative arterial phase of abdominal imaging on the current study. Musculoskeletal: No chest wall abnormality. No acute or significant osseous findings. Review of the MIP images confirms the above findings. IMPRESSION: 1. No evidence of pulmonary embolism or other acute findings in the chest. 2. Moderate cardiac enlargement with significant left ventricular dilatation. 3. Coronary atherosclerosis with diffuse calcified coronary artery plaque in a 3 vessel distribution. 4. Enlargement of rim calcified gallstone versus calcification in the gallbladder wall. Elective follow-up recommended as a porcelain gallbladder is an increased risk for development of future gallbladder carcinoma. 5. New calcifications in the region of the  pancreatic head with some associated suggestion of pancreatic atrophy since the prior abdominal CT. There may be some pancreatic ductal dilatation as well but this is not well evaluated on the relative arterial phase of abdominal imaging on the current study. Recommend elective follow-up with CT of the abdomen with and without contrast utilizing pancreatic protocol. Aortic Atherosclerosis (ICD10-I70.0). Electronically Signed   By: GAletta EdouardM.D.   On: 11/13/2021 14:50   DG Chest 2 View  Result Date: 11/13/2021 CLINICAL DATA:  Chest pain EXAM: CHEST - 2 VIEW COMPARISON:  11/09/2021 FINDINGS: Cardiomegaly. Both lungs are clear. The visualized skeletal structures are unremarkable. IMPRESSION: Cardiomegaly without acute abnormality of the lungs. Electronically Signed   By: ADelanna Ahmadi  M.D.   On: 11/13/2021 12:52    Procedures Procedures    Medications Ordered in ED Medications  ondansetron (ZOFRAN) injection 4 mg (4 mg Intravenous Given 11/13/21 1320)  iohexol (OMNIPAQUE) 350 MG/ML injection 75 mL (75 mLs Intravenous Contrast Given 11/13/21 1428)    ED Course/ Medical Decision Making/ A&P Clinical Course as of 11/13/21 1539  Sat Nov 13, 2021  1521 Patient remains asymptomatic.  He is hungry and eating crackers.  We discussed the CT findings, including incidental findings of calcifications of the pancreas as well as calcified gallbladder.  I will provide a general surgeon number for follow-up for the gallbladder, it is possible this was the cause of his symptoms today, as it began while he was eating. [MT]    Clinical Course User Index [MT] Deyjah Kindel, Carola Rhine, MD                           Medical Decision Making Amount and/or Complexity of Data Reviewed Labs: ordered. Radiology: ordered.  Risk Prescription drug management.   This patient presents to the ED with concern for atypical chest pain, near syncope, transient hypoxia. This involves an extensive number of treatment  options, and is a complaint that carries with it a high risk of complications and morbidity.  The differential diagnosis includes PE versus ACS versus pneumonia versus aspiration versus other  Co-morbidities that complicate the patient evaluation: History of congestive heart failure known coronary disease, at high risk for cardiac complications.  Additional history obtained from EMS on patient's arrival  External records from outside source obtained and reviewed including left heart catheterization recent hospitalization course  I ordered and personally interpreted labs.  The pertinent results include: Lab is a baseline level, including troponins which are was mildly elevated, but flat on repeat  I ordered imaging studies including CT PE I independently visualized and interpreted imaging which showed acute pulmonary embolism, chronic dilatation of the heart, no pneumonia, incidental noting of calcified gallbladder and calcifications of the pancreas I agree with the radiologist interpretation  The patient was maintained on a cardiac monitor.  I personally viewed and interpreted the cardiac monitored which showed an underlying rhythm of: Sinus rhythm  Per my interpretation the patient's ECG shows sinus rhythm no acute ischemic findings  I ordered medication including Zofran for nausea  I have reviewed the patients home medicines and have made adjustments as needed  Test Considered: With no active abdominal pain, no persistent vomiting or nausea, negative Murphy sign, do not feel the patient needed an emergent ultrasound of the gallbladder in the ED.  He will be referred for outpatient follow-up for his gallbladder.  After the interventions noted above, I reevaluated the patient and found that they have: improved  Patient remained asymptomatic throughout his 3 and half hour stay in the ED.  No further chest pain.  Dispostion:  After consideration of the diagnostic results and the patients  response to treatment, I feel that the patent would benefit from outpatient follow-up with PCP, cardiology, and general surgery.         Final Clinical Impression(s) / ED Diagnoses Final diagnoses:  Chest pain, unspecified type  Gallbladder anomaly  Pancreatic calcification    Rx / DC Orders ED Discharge Orders     None         Wyvonnia Dusky, MD 11/13/21 1539

## 2021-11-13 NOTE — ED Triage Notes (Addendum)
Pt BIB [facility] with reports of chest pain while at rest. Pt was given SL nitro and 324 ASA en route with resolution of chest pain. Pt O2 89% upon fire arrival.

## 2021-11-15 DIAGNOSIS — I700 Atherosclerosis of aorta: Secondary | ICD-10-CM | POA: Diagnosis not present

## 2021-11-15 DIAGNOSIS — Z87891 Personal history of nicotine dependence: Secondary | ICD-10-CM | POA: Diagnosis not present

## 2021-11-15 DIAGNOSIS — I5042 Chronic combined systolic (congestive) and diastolic (congestive) heart failure: Secondary | ICD-10-CM | POA: Diagnosis not present

## 2021-11-15 DIAGNOSIS — N138 Other obstructive and reflux uropathy: Secondary | ICD-10-CM | POA: Diagnosis not present

## 2021-11-15 DIAGNOSIS — E785 Hyperlipidemia, unspecified: Secondary | ICD-10-CM | POA: Diagnosis not present

## 2021-11-15 DIAGNOSIS — D509 Iron deficiency anemia, unspecified: Secondary | ICD-10-CM | POA: Diagnosis not present

## 2021-11-15 DIAGNOSIS — I2510 Atherosclerotic heart disease of native coronary artery without angina pectoris: Secondary | ICD-10-CM | POA: Diagnosis not present

## 2021-11-15 DIAGNOSIS — I255 Ischemic cardiomyopathy: Secondary | ICD-10-CM | POA: Diagnosis not present

## 2021-11-15 DIAGNOSIS — Z87440 Personal history of urinary (tract) infections: Secondary | ICD-10-CM | POA: Diagnosis not present

## 2021-11-15 DIAGNOSIS — N1832 Chronic kidney disease, stage 3b: Secondary | ICD-10-CM | POA: Diagnosis not present

## 2021-11-15 DIAGNOSIS — I214 Non-ST elevation (NSTEMI) myocardial infarction: Secondary | ICD-10-CM | POA: Diagnosis not present

## 2021-11-15 DIAGNOSIS — R338 Other retention of urine: Secondary | ICD-10-CM | POA: Diagnosis not present

## 2021-11-15 DIAGNOSIS — I6521 Occlusion and stenosis of right carotid artery: Secondary | ICD-10-CM | POA: Diagnosis not present

## 2021-11-15 DIAGNOSIS — Z85828 Personal history of other malignant neoplasm of skin: Secondary | ICD-10-CM | POA: Diagnosis not present

## 2021-11-15 DIAGNOSIS — J441 Chronic obstructive pulmonary disease with (acute) exacerbation: Secondary | ICD-10-CM | POA: Diagnosis not present

## 2021-11-15 DIAGNOSIS — I951 Orthostatic hypotension: Secondary | ICD-10-CM | POA: Diagnosis not present

## 2021-11-15 DIAGNOSIS — K6389 Other specified diseases of intestine: Secondary | ICD-10-CM | POA: Diagnosis not present

## 2021-11-15 DIAGNOSIS — Z86010 Personal history of colonic polyps: Secondary | ICD-10-CM | POA: Diagnosis not present

## 2021-11-15 DIAGNOSIS — Z7902 Long term (current) use of antithrombotics/antiplatelets: Secondary | ICD-10-CM | POA: Diagnosis not present

## 2021-11-15 DIAGNOSIS — I130 Hypertensive heart and chronic kidney disease with heart failure and stage 1 through stage 4 chronic kidney disease, or unspecified chronic kidney disease: Secondary | ICD-10-CM | POA: Diagnosis not present

## 2021-11-15 DIAGNOSIS — Z9181 History of falling: Secondary | ICD-10-CM | POA: Diagnosis not present

## 2021-11-17 ENCOUNTER — Telehealth (HOSPITAL_COMMUNITY): Payer: Self-pay | Admitting: *Deleted

## 2021-11-17 ENCOUNTER — Encounter (HOSPITAL_COMMUNITY): Payer: Medicare Other

## 2021-11-17 NOTE — Telephone Encounter (Signed)
Heart Failure Nurse Navigator Progress Note   Unable to leave appointment reminder message for 11 am on 11/17/21, due to cell number mailbox is full and home number is disconnected.   Earnestine Leys, BSN, Clinical cytogeneticist Only

## 2021-11-22 DIAGNOSIS — I700 Atherosclerosis of aorta: Secondary | ICD-10-CM | POA: Diagnosis not present

## 2021-11-22 DIAGNOSIS — I951 Orthostatic hypotension: Secondary | ICD-10-CM | POA: Diagnosis not present

## 2021-11-22 DIAGNOSIS — J441 Chronic obstructive pulmonary disease with (acute) exacerbation: Secondary | ICD-10-CM | POA: Diagnosis not present

## 2021-11-22 DIAGNOSIS — I214 Non-ST elevation (NSTEMI) myocardial infarction: Secondary | ICD-10-CM | POA: Diagnosis not present

## 2021-11-22 DIAGNOSIS — R338 Other retention of urine: Secondary | ICD-10-CM | POA: Diagnosis not present

## 2021-11-22 DIAGNOSIS — N1832 Chronic kidney disease, stage 3b: Secondary | ICD-10-CM | POA: Diagnosis not present

## 2021-11-22 DIAGNOSIS — E785 Hyperlipidemia, unspecified: Secondary | ICD-10-CM | POA: Diagnosis not present

## 2021-11-22 DIAGNOSIS — D509 Iron deficiency anemia, unspecified: Secondary | ICD-10-CM | POA: Diagnosis not present

## 2021-11-22 DIAGNOSIS — Z87440 Personal history of urinary (tract) infections: Secondary | ICD-10-CM | POA: Diagnosis not present

## 2021-11-22 DIAGNOSIS — Z9181 History of falling: Secondary | ICD-10-CM | POA: Diagnosis not present

## 2021-11-22 DIAGNOSIS — Z86010 Personal history of colonic polyps: Secondary | ICD-10-CM | POA: Diagnosis not present

## 2021-11-22 DIAGNOSIS — K6389 Other specified diseases of intestine: Secondary | ICD-10-CM | POA: Diagnosis not present

## 2021-11-22 DIAGNOSIS — Z7902 Long term (current) use of antithrombotics/antiplatelets: Secondary | ICD-10-CM | POA: Diagnosis not present

## 2021-11-22 DIAGNOSIS — I130 Hypertensive heart and chronic kidney disease with heart failure and stage 1 through stage 4 chronic kidney disease, or unspecified chronic kidney disease: Secondary | ICD-10-CM | POA: Diagnosis not present

## 2021-11-22 DIAGNOSIS — Z85828 Personal history of other malignant neoplasm of skin: Secondary | ICD-10-CM | POA: Diagnosis not present

## 2021-11-22 DIAGNOSIS — I255 Ischemic cardiomyopathy: Secondary | ICD-10-CM | POA: Diagnosis not present

## 2021-11-22 DIAGNOSIS — I2510 Atherosclerotic heart disease of native coronary artery without angina pectoris: Secondary | ICD-10-CM | POA: Diagnosis not present

## 2021-11-22 DIAGNOSIS — I5042 Chronic combined systolic (congestive) and diastolic (congestive) heart failure: Secondary | ICD-10-CM | POA: Diagnosis not present

## 2021-11-22 DIAGNOSIS — N138 Other obstructive and reflux uropathy: Secondary | ICD-10-CM | POA: Diagnosis not present

## 2021-11-22 DIAGNOSIS — I6521 Occlusion and stenosis of right carotid artery: Secondary | ICD-10-CM | POA: Diagnosis not present

## 2021-11-22 DIAGNOSIS — Z87891 Personal history of nicotine dependence: Secondary | ICD-10-CM | POA: Diagnosis not present

## 2021-11-25 DIAGNOSIS — N138 Other obstructive and reflux uropathy: Secondary | ICD-10-CM | POA: Diagnosis not present

## 2021-11-25 DIAGNOSIS — Z9181 History of falling: Secondary | ICD-10-CM | POA: Diagnosis not present

## 2021-11-25 DIAGNOSIS — Z87440 Personal history of urinary (tract) infections: Secondary | ICD-10-CM | POA: Diagnosis not present

## 2021-11-25 DIAGNOSIS — J441 Chronic obstructive pulmonary disease with (acute) exacerbation: Secondary | ICD-10-CM | POA: Diagnosis not present

## 2021-11-25 DIAGNOSIS — N1832 Chronic kidney disease, stage 3b: Secondary | ICD-10-CM | POA: Diagnosis not present

## 2021-11-25 DIAGNOSIS — Z87891 Personal history of nicotine dependence: Secondary | ICD-10-CM | POA: Diagnosis not present

## 2021-11-25 DIAGNOSIS — Z85828 Personal history of other malignant neoplasm of skin: Secondary | ICD-10-CM | POA: Diagnosis not present

## 2021-11-25 DIAGNOSIS — E785 Hyperlipidemia, unspecified: Secondary | ICD-10-CM | POA: Diagnosis not present

## 2021-11-25 DIAGNOSIS — I2510 Atherosclerotic heart disease of native coronary artery without angina pectoris: Secondary | ICD-10-CM | POA: Diagnosis not present

## 2021-11-25 DIAGNOSIS — I255 Ischemic cardiomyopathy: Secondary | ICD-10-CM | POA: Diagnosis not present

## 2021-11-25 DIAGNOSIS — Z7902 Long term (current) use of antithrombotics/antiplatelets: Secondary | ICD-10-CM | POA: Diagnosis not present

## 2021-11-25 DIAGNOSIS — D509 Iron deficiency anemia, unspecified: Secondary | ICD-10-CM | POA: Diagnosis not present

## 2021-11-25 DIAGNOSIS — I5042 Chronic combined systolic (congestive) and diastolic (congestive) heart failure: Secondary | ICD-10-CM | POA: Diagnosis not present

## 2021-11-25 DIAGNOSIS — R338 Other retention of urine: Secondary | ICD-10-CM | POA: Diagnosis not present

## 2021-11-25 DIAGNOSIS — I6521 Occlusion and stenosis of right carotid artery: Secondary | ICD-10-CM | POA: Diagnosis not present

## 2021-11-25 DIAGNOSIS — I214 Non-ST elevation (NSTEMI) myocardial infarction: Secondary | ICD-10-CM | POA: Diagnosis not present

## 2021-11-25 DIAGNOSIS — I130 Hypertensive heart and chronic kidney disease with heart failure and stage 1 through stage 4 chronic kidney disease, or unspecified chronic kidney disease: Secondary | ICD-10-CM | POA: Diagnosis not present

## 2021-11-25 DIAGNOSIS — Z86010 Personal history of colonic polyps: Secondary | ICD-10-CM | POA: Diagnosis not present

## 2021-11-25 DIAGNOSIS — I700 Atherosclerosis of aorta: Secondary | ICD-10-CM | POA: Diagnosis not present

## 2021-11-25 DIAGNOSIS — I951 Orthostatic hypotension: Secondary | ICD-10-CM | POA: Diagnosis not present

## 2021-11-25 DIAGNOSIS — K6389 Other specified diseases of intestine: Secondary | ICD-10-CM | POA: Diagnosis not present

## 2021-12-02 DIAGNOSIS — Z86010 Personal history of colonic polyps: Secondary | ICD-10-CM | POA: Diagnosis not present

## 2021-12-02 DIAGNOSIS — I6521 Occlusion and stenosis of right carotid artery: Secondary | ICD-10-CM | POA: Diagnosis not present

## 2021-12-02 DIAGNOSIS — I214 Non-ST elevation (NSTEMI) myocardial infarction: Secondary | ICD-10-CM | POA: Diagnosis not present

## 2021-12-02 DIAGNOSIS — N1832 Chronic kidney disease, stage 3b: Secondary | ICD-10-CM | POA: Diagnosis not present

## 2021-12-02 DIAGNOSIS — I130 Hypertensive heart and chronic kidney disease with heart failure and stage 1 through stage 4 chronic kidney disease, or unspecified chronic kidney disease: Secondary | ICD-10-CM | POA: Diagnosis not present

## 2021-12-02 DIAGNOSIS — Z87440 Personal history of urinary (tract) infections: Secondary | ICD-10-CM | POA: Diagnosis not present

## 2021-12-02 DIAGNOSIS — I951 Orthostatic hypotension: Secondary | ICD-10-CM | POA: Diagnosis not present

## 2021-12-02 DIAGNOSIS — E785 Hyperlipidemia, unspecified: Secondary | ICD-10-CM | POA: Diagnosis not present

## 2021-12-02 DIAGNOSIS — I2510 Atherosclerotic heart disease of native coronary artery without angina pectoris: Secondary | ICD-10-CM | POA: Diagnosis not present

## 2021-12-02 DIAGNOSIS — Z87891 Personal history of nicotine dependence: Secondary | ICD-10-CM | POA: Diagnosis not present

## 2021-12-02 DIAGNOSIS — D509 Iron deficiency anemia, unspecified: Secondary | ICD-10-CM | POA: Diagnosis not present

## 2021-12-02 DIAGNOSIS — K6389 Other specified diseases of intestine: Secondary | ICD-10-CM | POA: Diagnosis not present

## 2021-12-02 DIAGNOSIS — Z7902 Long term (current) use of antithrombotics/antiplatelets: Secondary | ICD-10-CM | POA: Diagnosis not present

## 2021-12-02 DIAGNOSIS — R338 Other retention of urine: Secondary | ICD-10-CM | POA: Diagnosis not present

## 2021-12-02 DIAGNOSIS — N138 Other obstructive and reflux uropathy: Secondary | ICD-10-CM | POA: Diagnosis not present

## 2021-12-02 DIAGNOSIS — Z85828 Personal history of other malignant neoplasm of skin: Secondary | ICD-10-CM | POA: Diagnosis not present

## 2021-12-02 DIAGNOSIS — I5042 Chronic combined systolic (congestive) and diastolic (congestive) heart failure: Secondary | ICD-10-CM | POA: Diagnosis not present

## 2021-12-02 DIAGNOSIS — J441 Chronic obstructive pulmonary disease with (acute) exacerbation: Secondary | ICD-10-CM | POA: Diagnosis not present

## 2021-12-02 DIAGNOSIS — I255 Ischemic cardiomyopathy: Secondary | ICD-10-CM | POA: Diagnosis not present

## 2021-12-02 DIAGNOSIS — Z9181 History of falling: Secondary | ICD-10-CM | POA: Diagnosis not present

## 2021-12-02 DIAGNOSIS — I700 Atherosclerosis of aorta: Secondary | ICD-10-CM | POA: Diagnosis not present

## 2021-12-03 DIAGNOSIS — N1832 Chronic kidney disease, stage 3b: Secondary | ICD-10-CM | POA: Diagnosis not present

## 2021-12-03 DIAGNOSIS — I255 Ischemic cardiomyopathy: Secondary | ICD-10-CM | POA: Diagnosis not present

## 2021-12-03 DIAGNOSIS — I700 Atherosclerosis of aorta: Secondary | ICD-10-CM | POA: Diagnosis not present

## 2021-12-03 DIAGNOSIS — R338 Other retention of urine: Secondary | ICD-10-CM | POA: Diagnosis not present

## 2021-12-03 DIAGNOSIS — I5042 Chronic combined systolic (congestive) and diastolic (congestive) heart failure: Secondary | ICD-10-CM | POA: Diagnosis not present

## 2021-12-03 DIAGNOSIS — I951 Orthostatic hypotension: Secondary | ICD-10-CM | POA: Diagnosis not present

## 2021-12-03 DIAGNOSIS — E785 Hyperlipidemia, unspecified: Secondary | ICD-10-CM | POA: Diagnosis not present

## 2021-12-03 DIAGNOSIS — N138 Other obstructive and reflux uropathy: Secondary | ICD-10-CM | POA: Diagnosis not present

## 2021-12-03 DIAGNOSIS — I2510 Atherosclerotic heart disease of native coronary artery without angina pectoris: Secondary | ICD-10-CM | POA: Diagnosis not present

## 2021-12-03 DIAGNOSIS — Z85828 Personal history of other malignant neoplasm of skin: Secondary | ICD-10-CM | POA: Diagnosis not present

## 2021-12-03 DIAGNOSIS — K6389 Other specified diseases of intestine: Secondary | ICD-10-CM | POA: Diagnosis not present

## 2021-12-03 DIAGNOSIS — Z86010 Personal history of colonic polyps: Secondary | ICD-10-CM | POA: Diagnosis not present

## 2021-12-03 DIAGNOSIS — I214 Non-ST elevation (NSTEMI) myocardial infarction: Secondary | ICD-10-CM | POA: Diagnosis not present

## 2021-12-03 DIAGNOSIS — Z87891 Personal history of nicotine dependence: Secondary | ICD-10-CM | POA: Diagnosis not present

## 2021-12-03 DIAGNOSIS — J441 Chronic obstructive pulmonary disease with (acute) exacerbation: Secondary | ICD-10-CM | POA: Diagnosis not present

## 2021-12-03 DIAGNOSIS — I130 Hypertensive heart and chronic kidney disease with heart failure and stage 1 through stage 4 chronic kidney disease, or unspecified chronic kidney disease: Secondary | ICD-10-CM | POA: Diagnosis not present

## 2021-12-03 DIAGNOSIS — Z87440 Personal history of urinary (tract) infections: Secondary | ICD-10-CM | POA: Diagnosis not present

## 2021-12-03 DIAGNOSIS — I6521 Occlusion and stenosis of right carotid artery: Secondary | ICD-10-CM | POA: Diagnosis not present

## 2021-12-03 DIAGNOSIS — Z9181 History of falling: Secondary | ICD-10-CM | POA: Diagnosis not present

## 2021-12-03 DIAGNOSIS — Z7902 Long term (current) use of antithrombotics/antiplatelets: Secondary | ICD-10-CM | POA: Diagnosis not present

## 2021-12-03 DIAGNOSIS — D509 Iron deficiency anemia, unspecified: Secondary | ICD-10-CM | POA: Diagnosis not present

## 2021-12-06 DIAGNOSIS — N1832 Chronic kidney disease, stage 3b: Secondary | ICD-10-CM | POA: Diagnosis not present

## 2021-12-06 DIAGNOSIS — Z7902 Long term (current) use of antithrombotics/antiplatelets: Secondary | ICD-10-CM | POA: Diagnosis not present

## 2021-12-06 DIAGNOSIS — E785 Hyperlipidemia, unspecified: Secondary | ICD-10-CM | POA: Diagnosis not present

## 2021-12-06 DIAGNOSIS — I2510 Atherosclerotic heart disease of native coronary artery without angina pectoris: Secondary | ICD-10-CM | POA: Diagnosis not present

## 2021-12-06 DIAGNOSIS — I255 Ischemic cardiomyopathy: Secondary | ICD-10-CM | POA: Diagnosis not present

## 2021-12-06 DIAGNOSIS — Z87891 Personal history of nicotine dependence: Secondary | ICD-10-CM | POA: Diagnosis not present

## 2021-12-06 DIAGNOSIS — Z86010 Personal history of colonic polyps: Secondary | ICD-10-CM | POA: Diagnosis not present

## 2021-12-06 DIAGNOSIS — I214 Non-ST elevation (NSTEMI) myocardial infarction: Secondary | ICD-10-CM | POA: Diagnosis not present

## 2021-12-06 DIAGNOSIS — I130 Hypertensive heart and chronic kidney disease with heart failure and stage 1 through stage 4 chronic kidney disease, or unspecified chronic kidney disease: Secondary | ICD-10-CM | POA: Diagnosis not present

## 2021-12-06 DIAGNOSIS — N138 Other obstructive and reflux uropathy: Secondary | ICD-10-CM | POA: Diagnosis not present

## 2021-12-06 DIAGNOSIS — Z87440 Personal history of urinary (tract) infections: Secondary | ICD-10-CM | POA: Diagnosis not present

## 2021-12-06 DIAGNOSIS — K6389 Other specified diseases of intestine: Secondary | ICD-10-CM | POA: Diagnosis not present

## 2021-12-06 DIAGNOSIS — I700 Atherosclerosis of aorta: Secondary | ICD-10-CM | POA: Diagnosis not present

## 2021-12-06 DIAGNOSIS — R338 Other retention of urine: Secondary | ICD-10-CM | POA: Diagnosis not present

## 2021-12-06 DIAGNOSIS — D509 Iron deficiency anemia, unspecified: Secondary | ICD-10-CM | POA: Diagnosis not present

## 2021-12-06 DIAGNOSIS — Z9181 History of falling: Secondary | ICD-10-CM | POA: Diagnosis not present

## 2021-12-06 DIAGNOSIS — I951 Orthostatic hypotension: Secondary | ICD-10-CM | POA: Diagnosis not present

## 2021-12-06 DIAGNOSIS — I5042 Chronic combined systolic (congestive) and diastolic (congestive) heart failure: Secondary | ICD-10-CM | POA: Diagnosis not present

## 2021-12-06 DIAGNOSIS — J441 Chronic obstructive pulmonary disease with (acute) exacerbation: Secondary | ICD-10-CM | POA: Diagnosis not present

## 2021-12-06 DIAGNOSIS — I6521 Occlusion and stenosis of right carotid artery: Secondary | ICD-10-CM | POA: Diagnosis not present

## 2021-12-06 DIAGNOSIS — Z85828 Personal history of other malignant neoplasm of skin: Secondary | ICD-10-CM | POA: Diagnosis not present

## 2021-12-07 DIAGNOSIS — Z85828 Personal history of other malignant neoplasm of skin: Secondary | ICD-10-CM | POA: Diagnosis not present

## 2021-12-07 DIAGNOSIS — I214 Non-ST elevation (NSTEMI) myocardial infarction: Secondary | ICD-10-CM | POA: Diagnosis not present

## 2021-12-07 DIAGNOSIS — Z87891 Personal history of nicotine dependence: Secondary | ICD-10-CM | POA: Diagnosis not present

## 2021-12-07 DIAGNOSIS — Z86010 Personal history of colonic polyps: Secondary | ICD-10-CM | POA: Diagnosis not present

## 2021-12-07 DIAGNOSIS — N138 Other obstructive and reflux uropathy: Secondary | ICD-10-CM | POA: Diagnosis not present

## 2021-12-07 DIAGNOSIS — K6389 Other specified diseases of intestine: Secondary | ICD-10-CM | POA: Diagnosis not present

## 2021-12-07 DIAGNOSIS — Z87440 Personal history of urinary (tract) infections: Secondary | ICD-10-CM | POA: Diagnosis not present

## 2021-12-07 DIAGNOSIS — I2510 Atherosclerotic heart disease of native coronary artery without angina pectoris: Secondary | ICD-10-CM | POA: Diagnosis not present

## 2021-12-07 DIAGNOSIS — I700 Atherosclerosis of aorta: Secondary | ICD-10-CM | POA: Diagnosis not present

## 2021-12-07 DIAGNOSIS — Z9181 History of falling: Secondary | ICD-10-CM | POA: Diagnosis not present

## 2021-12-07 DIAGNOSIS — D509 Iron deficiency anemia, unspecified: Secondary | ICD-10-CM | POA: Diagnosis not present

## 2021-12-07 DIAGNOSIS — J441 Chronic obstructive pulmonary disease with (acute) exacerbation: Secondary | ICD-10-CM | POA: Diagnosis not present

## 2021-12-07 DIAGNOSIS — I951 Orthostatic hypotension: Secondary | ICD-10-CM | POA: Diagnosis not present

## 2021-12-07 DIAGNOSIS — E785 Hyperlipidemia, unspecified: Secondary | ICD-10-CM | POA: Diagnosis not present

## 2021-12-07 DIAGNOSIS — N1832 Chronic kidney disease, stage 3b: Secondary | ICD-10-CM | POA: Diagnosis not present

## 2021-12-07 DIAGNOSIS — I6521 Occlusion and stenosis of right carotid artery: Secondary | ICD-10-CM | POA: Diagnosis not present

## 2021-12-07 DIAGNOSIS — R338 Other retention of urine: Secondary | ICD-10-CM | POA: Diagnosis not present

## 2021-12-07 DIAGNOSIS — I5042 Chronic combined systolic (congestive) and diastolic (congestive) heart failure: Secondary | ICD-10-CM | POA: Diagnosis not present

## 2021-12-07 DIAGNOSIS — Z7902 Long term (current) use of antithrombotics/antiplatelets: Secondary | ICD-10-CM | POA: Diagnosis not present

## 2021-12-07 DIAGNOSIS — I130 Hypertensive heart and chronic kidney disease with heart failure and stage 1 through stage 4 chronic kidney disease, or unspecified chronic kidney disease: Secondary | ICD-10-CM | POA: Diagnosis not present

## 2021-12-07 DIAGNOSIS — I255 Ischemic cardiomyopathy: Secondary | ICD-10-CM | POA: Diagnosis not present

## 2021-12-08 DIAGNOSIS — Q441 Other congenital malformations of gallbladder: Secondary | ICD-10-CM | POA: Insufficient documentation

## 2021-12-08 DIAGNOSIS — Z86010 Personal history of colonic polyps: Secondary | ICD-10-CM | POA: Diagnosis not present

## 2021-12-08 DIAGNOSIS — I7143 Infrarenal abdominal aortic aneurysm, without rupture: Secondary | ICD-10-CM | POA: Diagnosis not present

## 2021-12-08 DIAGNOSIS — I5043 Acute on chronic combined systolic (congestive) and diastolic (congestive) heart failure: Secondary | ICD-10-CM | POA: Diagnosis not present

## 2021-12-08 DIAGNOSIS — I214 Non-ST elevation (NSTEMI) myocardial infarction: Secondary | ICD-10-CM | POA: Diagnosis not present

## 2021-12-08 DIAGNOSIS — E785 Hyperlipidemia, unspecified: Secondary | ICD-10-CM | POA: Diagnosis not present

## 2021-12-08 DIAGNOSIS — L8990 Pressure ulcer of unspecified site, unspecified stage: Secondary | ICD-10-CM | POA: Diagnosis not present

## 2021-12-08 DIAGNOSIS — I6529 Occlusion and stenosis of unspecified carotid artery: Secondary | ICD-10-CM | POA: Diagnosis not present

## 2021-12-08 DIAGNOSIS — N1832 Chronic kidney disease, stage 3b: Secondary | ICD-10-CM | POA: Diagnosis not present

## 2021-12-08 DIAGNOSIS — E876 Hypokalemia: Secondary | ICD-10-CM | POA: Diagnosis not present

## 2021-12-08 DIAGNOSIS — I10 Essential (primary) hypertension: Secondary | ICD-10-CM | POA: Diagnosis not present

## 2021-12-08 DIAGNOSIS — J449 Chronic obstructive pulmonary disease, unspecified: Secondary | ICD-10-CM | POA: Diagnosis not present

## 2021-12-10 DIAGNOSIS — I214 Non-ST elevation (NSTEMI) myocardial infarction: Secondary | ICD-10-CM | POA: Diagnosis not present

## 2021-12-10 DIAGNOSIS — I2510 Atherosclerotic heart disease of native coronary artery without angina pectoris: Secondary | ICD-10-CM | POA: Diagnosis not present

## 2021-12-10 DIAGNOSIS — Z86010 Personal history of colonic polyps: Secondary | ICD-10-CM | POA: Diagnosis not present

## 2021-12-10 DIAGNOSIS — N138 Other obstructive and reflux uropathy: Secondary | ICD-10-CM | POA: Diagnosis not present

## 2021-12-10 DIAGNOSIS — R338 Other retention of urine: Secondary | ICD-10-CM | POA: Diagnosis not present

## 2021-12-10 DIAGNOSIS — E785 Hyperlipidemia, unspecified: Secondary | ICD-10-CM | POA: Diagnosis not present

## 2021-12-10 DIAGNOSIS — Z87891 Personal history of nicotine dependence: Secondary | ICD-10-CM | POA: Diagnosis not present

## 2021-12-10 DIAGNOSIS — N1832 Chronic kidney disease, stage 3b: Secondary | ICD-10-CM | POA: Diagnosis not present

## 2021-12-10 DIAGNOSIS — Z9181 History of falling: Secondary | ICD-10-CM | POA: Diagnosis not present

## 2021-12-10 DIAGNOSIS — I951 Orthostatic hypotension: Secondary | ICD-10-CM | POA: Diagnosis not present

## 2021-12-10 DIAGNOSIS — Z7902 Long term (current) use of antithrombotics/antiplatelets: Secondary | ICD-10-CM | POA: Diagnosis not present

## 2021-12-10 DIAGNOSIS — K6389 Other specified diseases of intestine: Secondary | ICD-10-CM | POA: Diagnosis not present

## 2021-12-10 DIAGNOSIS — I6521 Occlusion and stenosis of right carotid artery: Secondary | ICD-10-CM | POA: Diagnosis not present

## 2021-12-10 DIAGNOSIS — Z87440 Personal history of urinary (tract) infections: Secondary | ICD-10-CM | POA: Diagnosis not present

## 2021-12-10 DIAGNOSIS — J441 Chronic obstructive pulmonary disease with (acute) exacerbation: Secondary | ICD-10-CM | POA: Diagnosis not present

## 2021-12-10 DIAGNOSIS — Z85828 Personal history of other malignant neoplasm of skin: Secondary | ICD-10-CM | POA: Diagnosis not present

## 2021-12-10 DIAGNOSIS — I700 Atherosclerosis of aorta: Secondary | ICD-10-CM | POA: Diagnosis not present

## 2021-12-10 DIAGNOSIS — I5042 Chronic combined systolic (congestive) and diastolic (congestive) heart failure: Secondary | ICD-10-CM | POA: Diagnosis not present

## 2021-12-10 DIAGNOSIS — D509 Iron deficiency anemia, unspecified: Secondary | ICD-10-CM | POA: Diagnosis not present

## 2021-12-10 DIAGNOSIS — I130 Hypertensive heart and chronic kidney disease with heart failure and stage 1 through stage 4 chronic kidney disease, or unspecified chronic kidney disease: Secondary | ICD-10-CM | POA: Diagnosis not present

## 2021-12-10 DIAGNOSIS — I255 Ischemic cardiomyopathy: Secondary | ICD-10-CM | POA: Diagnosis not present

## 2021-12-14 ENCOUNTER — Ambulatory Visit: Payer: Medicare Other | Attending: Cardiology | Admitting: Cardiology

## 2021-12-14 ENCOUNTER — Encounter: Payer: Self-pay | Admitting: Cardiology

## 2021-12-14 VITALS — BP 172/98 | HR 78 | Ht 72.0 in | Wt 152.6 lb

## 2021-12-14 DIAGNOSIS — I5042 Chronic combined systolic (congestive) and diastolic (congestive) heart failure: Secondary | ICD-10-CM | POA: Diagnosis not present

## 2021-12-14 DIAGNOSIS — I10 Essential (primary) hypertension: Secondary | ICD-10-CM

## 2021-12-14 DIAGNOSIS — J441 Chronic obstructive pulmonary disease with (acute) exacerbation: Secondary | ICD-10-CM | POA: Diagnosis not present

## 2021-12-14 DIAGNOSIS — Z79899 Other long term (current) drug therapy: Secondary | ICD-10-CM

## 2021-12-14 DIAGNOSIS — I5020 Unspecified systolic (congestive) heart failure: Secondary | ICD-10-CM | POA: Diagnosis not present

## 2021-12-14 DIAGNOSIS — I2510 Atherosclerotic heart disease of native coronary artery without angina pectoris: Secondary | ICD-10-CM | POA: Diagnosis not present

## 2021-12-14 DIAGNOSIS — K6389 Other specified diseases of intestine: Secondary | ICD-10-CM | POA: Diagnosis not present

## 2021-12-14 DIAGNOSIS — I214 Non-ST elevation (NSTEMI) myocardial infarction: Secondary | ICD-10-CM | POA: Diagnosis not present

## 2021-12-14 DIAGNOSIS — E785 Hyperlipidemia, unspecified: Secondary | ICD-10-CM | POA: Diagnosis not present

## 2021-12-14 DIAGNOSIS — Z85828 Personal history of other malignant neoplasm of skin: Secondary | ICD-10-CM | POA: Diagnosis not present

## 2021-12-14 DIAGNOSIS — I951 Orthostatic hypotension: Secondary | ICD-10-CM | POA: Diagnosis not present

## 2021-12-14 DIAGNOSIS — Z86010 Personal history of colonic polyps: Secondary | ICD-10-CM | POA: Diagnosis not present

## 2021-12-14 DIAGNOSIS — Z87440 Personal history of urinary (tract) infections: Secondary | ICD-10-CM | POA: Diagnosis not present

## 2021-12-14 DIAGNOSIS — I25119 Atherosclerotic heart disease of native coronary artery with unspecified angina pectoris: Secondary | ICD-10-CM

## 2021-12-14 DIAGNOSIS — N1832 Chronic kidney disease, stage 3b: Secondary | ICD-10-CM | POA: Diagnosis not present

## 2021-12-14 DIAGNOSIS — Z9181 History of falling: Secondary | ICD-10-CM | POA: Diagnosis not present

## 2021-12-14 DIAGNOSIS — I6521 Occlusion and stenosis of right carotid artery: Secondary | ICD-10-CM | POA: Diagnosis not present

## 2021-12-14 DIAGNOSIS — I130 Hypertensive heart and chronic kidney disease with heart failure and stage 1 through stage 4 chronic kidney disease, or unspecified chronic kidney disease: Secondary | ICD-10-CM | POA: Diagnosis not present

## 2021-12-14 DIAGNOSIS — Z87891 Personal history of nicotine dependence: Secondary | ICD-10-CM | POA: Diagnosis not present

## 2021-12-14 DIAGNOSIS — R338 Other retention of urine: Secondary | ICD-10-CM | POA: Diagnosis not present

## 2021-12-14 DIAGNOSIS — I700 Atherosclerosis of aorta: Secondary | ICD-10-CM | POA: Diagnosis not present

## 2021-12-14 DIAGNOSIS — D509 Iron deficiency anemia, unspecified: Secondary | ICD-10-CM | POA: Diagnosis not present

## 2021-12-14 DIAGNOSIS — Z7902 Long term (current) use of antithrombotics/antiplatelets: Secondary | ICD-10-CM | POA: Diagnosis not present

## 2021-12-14 DIAGNOSIS — N138 Other obstructive and reflux uropathy: Secondary | ICD-10-CM | POA: Diagnosis not present

## 2021-12-14 DIAGNOSIS — I255 Ischemic cardiomyopathy: Secondary | ICD-10-CM | POA: Diagnosis not present

## 2021-12-14 MED ORDER — ENTRESTO 24-26 MG PO TABS: 1.0000 | ORAL_TABLET | Freq: Two times a day (BID) | ORAL | 6 refills | Status: DC

## 2021-12-14 NOTE — Progress Notes (Signed)
Cardiology Office Note  Date: 12/14/2021   ID: Michael [name], DOB 13-Oct-1955, MRN [id number]  PCP:  [name], MD  Cardiologist:  Rozann Lesches, MD Electrophysiologist:  None   Chief Complaint  Patient presents with   Cardiac follow-up    History of Present Illness: Michael [name] is a 66 y.o. male last seen in July by Ms. Swinyer NP, I reviewed the note (I last saw him in December 2022).  I reviewed extensive interval records including cardiac testing and recent hospitalization.  Cardiac catheterization in September revealed RCA/RV marginal disease that was felt to be best managed medically.  He does have a mixed cardiomyopathy (out of proportion to degree of CAD), LVEF approximately 30% as of September with restrictive diastolic filling pattern.  His last hospital stay was related to chest pain and also history of syncope, medications adjustment with recommendation for outpatient ZIO monitor.  He was then seen in the ER with similar symptoms but also hypoxia.  Chest CTA showed no evidence of pulmonary embolus.  He is here today for routine visit.  Reports no chest pain, no dizziness or syncope.  As it turns out he never wore the ZIO monitor so there are no results to review.  I went over his medications today in detail.  He has had some left leg swelling.  No orthopnea or PND.  Weight is up compared to October.  He is taking Lasix with potassium supplement.  Blood pressure not well controlled today.  We discussed addition of Entresto as his creatinine most recently was down to 1.56 and potassium normal.  At this point not yet on optimal GDMT I would hold off on a repeat echocardiogram.  Past Medical History:  Diagnosis Date   Aortic atherosclerosis ([place]) 08/03/2021   CAD (coronary artery disease) 08/03/2021   Cardiac catheterization September 2023 with RCA/RV marginal stenosis managed medically   Cardiomyopathy ([place])    Carotid artery disease ([place])    CKD (chronic kidney  disease) stage 3, GFR 30-59 ml/min (HCC)    COPD (chronic obstructive pulmonary disease) ([place])    Essential hypertension    Head and neck cancer ([place]) 2019   Right facial basal cell carcinoma s/p resection with right partial mastectomy and partal rhinectomy with skin graft (06/13/17)   Iron deficiency anemia    Urinary retention     Past Surgical History:  Procedure Laterality Date   ANKLE CLOSED REDUCTION Right    open reduction   BASAL CELL CARCINOMA EXCISION  2019   [facility]   BIOPSY  07/19/2021   Procedure: BIOPSY;  Surgeon: Eloise Harman, DO;  [facility];  Service: Endoscopy;;   COLONOSCOPY N/A 05/31/2017   Procedure: COLONOSCOPY;  Surgeon: Daneil Dolin, MD;  [facility];  Service: Endoscopy;  Laterality: N/A;  2:45pm   COLONOSCOPY WITH PROPOFOL N/A 07/19/2021   Procedure: COLONOSCOPY WITH PROPOFOL;  Surgeon: Eloise Harman, DO;  [facility];  Service: Endoscopy;  Laterality: N/A;  3:00pm, moved up to 9:00   CYSTOSCOPY N/A 10/29/2018   Procedure: CYSTOSCOPY, CLOT EVACUATION;  Surgeon: Ceasar Mons, MD;  [facility];  Service: Urology;  Laterality: N/A;   PARTIAL COLECTOMY Right 08/11/2021   Procedure: PARTIAL COLECTOMY, OPEN RIGHT HEMICOLECTOMY;  Surgeon: Virl Cagey, MD;  [facility];  Service: General;  Laterality: Right;   POLYPECTOMY  05/31/2017   Procedure: POLYPECTOMY;  Surgeon: Daneil Dolin, MD;  [facility];  Service: Endoscopy;;   RIGHT/LEFT HEART CATH AND CORONARY ANGIOGRAPHY N/A 10/22/2021   Procedure: RIGHT/LEFT HEART CATH AND CORONARY ANGIOGRAPHY;  Surgeon: Early Osmond, MD;  Location: [facility];  Service: Cardiovascular;  Laterality: N/A;   TONSILLECTOMY     TRANSCAROTID ARTERY REVASCULARIZATION  Right 02/15/2021   Procedure: RIGHT TRANSCAROTID ARTERY REVASCULARIZATION;  Surgeon: [name] Heck, MD;  Location: [place];  Service: Vascular;  Laterality: Right;    ULTRASOUND GUIDANCE FOR VASCULAR ACCESS Left 02/15/2021   Procedure: ULTRASOUND GUIDANCE FOR VASCULAR ACCESS;  Surgeon: [name] Heck, MD;  Location: [place];  Service: Vascular;  Laterality: Left;   XI ROBOTIC ASSISTED SIMPLE PROSTATECTOMY N/A 10/29/2018   Procedure: XI ROBOTIC ASSISTED SIMPLE PROSTATECTOMY;  Surgeon: Cleon Gustin, MD;  [facility];  Service: Urology;  Laterality: N/A;    Current Outpatient Medications  Medication Sig Dispense Refill   albuterol (VENTOLIN HFA) 108 (90 Base) MCG/ACT inhaler Inhale 2 puffs into the lungs every 6 (six) hours as needed for wheezing or shortness of breath. 8 g 2   amLODipine (NORVASC) 5 MG tablet Take 5 mg by mouth daily.     aspirin EC 81 MG tablet Take 81 mg by mouth daily.     atorvastatin (LIPITOR) 80 MG tablet Take 1 tablet (80 mg total) by mouth every evening. 90 tablet 3   Budeson-Glycopyrrol-Formoterol (BREZTRI AEROSPHERE) 160-9-4.8 MCG/ACT AERO Inhale 2 puffs into the lungs 2 (two) times daily.     carvedilol (COREG) 12.5 MG tablet Take 1 tablet (12.5 mg total) by mouth 2 (two) times daily with a meal. 60 tablet 0   clopidogrel (PLAVIX) 75 MG tablet Take 1 tablet (75 mg total) by mouth daily. 30 tablet 0   ergocalciferol (VITAMIN D2) 1.25 MG (50000 UT) capsule Take 1 capsule (50,000 Units total) by mouth once a week. 5 capsule 6   Evolocumab (REPATHA SURECLICK) 935 MG/ML SOAJ Inject 1 pen into the skin every 14 (fourteen) days. 2 mL 11   furosemide (LASIX) 40 MG tablet Take 1 tablet (40 mg total) by mouth daily. 30 tablet 0   isosorbide mononitrate (IMDUR) 60 MG 24 hr tablet Take 1 tablet (60 mg total) by mouth every evening. 30 tablet 0   potassium chloride SA (KLOR-CON M) 20 MEQ tablet Take 1 tablet (20 mEq total) by mouth 2 (two) times daily. 60 tablet 0   sacubitril-valsartan (ENTRESTO) 24-26 MG Take 1 tablet by mouth 2 (two) times daily. 60 tablet 6   No current facility-administered medications for this visit.    Allergies:  Patient has no known allergies.   ROS: No recurrent syncope.  Physical Exam: VS:  BP (!) 172/98   Pulse 78   Ht 6' (1.829 m)   Wt 152 lb 9.6 oz (69.2 kg)   SpO2 98%   BMI 20.70 kg/m , BMI Body mass index is 20.7 kg/m.  Wt Readings from Last 3 Encounters:  12/14/21 152 lb 9.6 oz (69.2 kg)  11/09/21 145 lb 15.1 oz (66.2 kg)  10/26/21 139 lb 12.4 oz (63.4 kg)    General: Patient appears comfortable at rest. HEENT: Conjunctiva and lids normal. Neck: Supple, no elevated JVP or carotid bruits. Lungs: Clear to auscultation, nonlabored breathing at rest. Cardiac: Indistinct PMI, no S3, 1/6 systolic murmur, no rub. Abdomen: Soft, nontender, bowel sounds present. Extremities: 2+ left lower leg edema.  ECG:  An ECG dated 11/13/2021 was personally reviewed today and demonstrated:  Sinus rhythm with prolonged PR interval,  IVCD and diffuse ST-T wave abnormalities, likely repolarization changes.  Recent Labwork: 11/07/2021: ALT 30; AST 40 11/08/2021: Magnesium 2.0 11/13/2021: B Natriuretic Peptide 2,644.0; BUN 29; Creatinine, Ser 1.56; Hemoglobin 10.4; Platelets 326; Potassium 4.0; Sodium 138     Component Value Date/Time   CHOL 128 08/19/2021 1030   TRIG 121 08/19/2021 1030   HDL 40 (L) 08/19/2021 1030   CHOLHDL 3.2 08/19/2021 1030   VLDL 24 08/19/2021 1030   LDLCALC 64 08/19/2021 1030    Other Studies Reviewed Today:  Echocardiogram 10/21/2021:  1. LV function is depressed with diffuse hypokinesis in the lateral and  inferiror walls Compared to echo from 2019, LVEF is worse. . Left  ventricular ejection fraction, by estimation, is 30%. The left ventricle  has moderately decreased function. The  left ventricular internal cavity size was severely dilated. There is mild  left ventricular hypertrophy. Left ventricular diastolic parameters are  consistent with Grade III diastolic dysfunction (restrictive). Elevated  left atrial pressure.   2. Right ventricular  systolic function is low normal. The right  ventricular size is normal. There is normal pulmonary artery systolic  pressure.   3. Left atrial size was severely dilated.   4. Right atrial size was moderately dilated.   5. A small pericardial effusion is present. The pericardial effusion is  circumferential.   6. Mild mitral valve regurgitation.   7. The aortic valve is tricuspid. Aortic valve regurgitation is not  visualized. Aortic valve sclerosis is present, with no evidence of aortic  valve stenosis.   8. The inferior vena cava dilated.   Cardiac catheterization 10/22/2021:   Mid RCA lesion is 70% stenosed.   Acute Mrg lesion is 80% stenosed.   1.  Mild to moderate obstructive coronary artery disease that is outweighed by the patient's degree of LV dysfunction.  There is a significant bifurcation lesion of the right coronary artery and large RV marginal branch.  This is moderately calcified and relatively tortuous.  Given the patient's lack of chest pain, medical therapy should be pursued. 2.  Cardiac output of 6.3 L/min, cardiac index of 3.3 L/min/m, mean RA pressure of 16 mmHg, mean wedge pressure of 21 mmHg, and LVEDP of 21 mmHg.   Recommendation: Goal-directed medical therapy with augmentation of diuretics; consider dual antiplatelet therapy for 1 year for medical treatment of acute coronary syndrome.  Assessment and Plan:  1.  HFrEF with mixed cardiomyopathy, LVEF approximately 30% by echocardiogram in September, RV contraction low normal.  Degree of LV dysfunction out of proportion to CAD which is moderate and involving the RCA/RV marginal and felt to be best managed medically at angiography in September.  Plan is to try and optimize GDMT.  He is currently on Coreg and Lasix.  We will initiate Entresto 24/26 mg twice daily, follow-up BMET in 7 to 10 days.  His most recent creatinine was 1.56 with potassium 4.0.  Doubt that we will be able to add MRA, but SGLT2 inhibitor may be  considered next.  Follow-up arranged.  Too early for repeat echocardiogram.  2.  History of syncope without recurrence recently.  Initial plan was for outpatient ZIO monitor however he states that he never wore this.  Reports no recurring syncope, we will hold off on further monitoring for now and focus on optimizing GDMT.  3.  CKD stage IIIb, creatinine 1.56 most recently.  4.  Mild to moderate CAD, 70 to 80% mid RCA and RV marginal stenoses managed medically at angiography in September.  Reports no definite angina.  He is currently on aspirin, Plavix, Lipitor, and Repatha.  His last LDL was 64.  5.  Essential hypertension, not well controlled as yet.  Also on Norvasc.  We are adding Entresto as discussed above.  Medication Adjustments/Labs and Tests Ordered: Current medicines are reviewed at length with the patient today.  Concerns regarding medicines are outlined above.   Tests Ordered: Orders Placed This Encounter  Procedures   Basic metabolic panel    Medication Changes: Meds ordered this encounter  Medications   sacubitril-valsartan (ENTRESTO) 24-26 MG    Sig: Take 1 tablet by mouth 2 (two) times daily.    Dispense:  60 tablet    Refill:  6    12/14/2021 NEW    Disposition:  Follow up  4 weeks in the [place] office.  Signed, Satira Sark, MD, [facility] 12/14/2021 4:16 PM    [facility] [address], [place], [place] [address] Phone: [phone]; Fax: [phone]

## 2021-12-14 NOTE — Patient Instructions (Addendum)
Medication Instructions:  Your physician has recommended you make the following change in your medication:  Start entresto 24/26 mg by mouth twice daily Continue other medications the same  Labwork: BMET in 2 weeks (12/28/21) Non-fasting [facility] or [facility]  Testing/Procedures: none  Follow-Up: Your physician recommends that you schedule a follow-up appointment in: 4 weeks at the [place] office  Any Other Special Instructions Will Be Listed Below (If Applicable).  If you need a refill on your cardiac medications before your next appointment, please call your pharmacy.

## 2021-12-15 DIAGNOSIS — I951 Orthostatic hypotension: Secondary | ICD-10-CM | POA: Diagnosis not present

## 2021-12-15 DIAGNOSIS — I214 Non-ST elevation (NSTEMI) myocardial infarction: Secondary | ICD-10-CM | POA: Diagnosis not present

## 2021-12-15 DIAGNOSIS — Z7902 Long term (current) use of antithrombotics/antiplatelets: Secondary | ICD-10-CM | POA: Diagnosis not present

## 2021-12-15 DIAGNOSIS — I2510 Atherosclerotic heart disease of native coronary artery without angina pectoris: Secondary | ICD-10-CM | POA: Diagnosis not present

## 2021-12-15 DIAGNOSIS — N1832 Chronic kidney disease, stage 3b: Secondary | ICD-10-CM | POA: Diagnosis not present

## 2021-12-15 DIAGNOSIS — Z87891 Personal history of nicotine dependence: Secondary | ICD-10-CM | POA: Diagnosis not present

## 2021-12-15 DIAGNOSIS — Z86010 Personal history of colonic polyps: Secondary | ICD-10-CM | POA: Diagnosis not present

## 2021-12-15 DIAGNOSIS — N138 Other obstructive and reflux uropathy: Secondary | ICD-10-CM | POA: Diagnosis not present

## 2021-12-15 DIAGNOSIS — J441 Chronic obstructive pulmonary disease with (acute) exacerbation: Secondary | ICD-10-CM | POA: Diagnosis not present

## 2021-12-15 DIAGNOSIS — I5042 Chronic combined systolic (congestive) and diastolic (congestive) heart failure: Secondary | ICD-10-CM | POA: Diagnosis not present

## 2021-12-15 DIAGNOSIS — I130 Hypertensive heart and chronic kidney disease with heart failure and stage 1 through stage 4 chronic kidney disease, or unspecified chronic kidney disease: Secondary | ICD-10-CM | POA: Diagnosis not present

## 2021-12-15 DIAGNOSIS — K6389 Other specified diseases of intestine: Secondary | ICD-10-CM | POA: Diagnosis not present

## 2021-12-15 DIAGNOSIS — R338 Other retention of urine: Secondary | ICD-10-CM | POA: Diagnosis not present

## 2021-12-15 DIAGNOSIS — Z9181 History of falling: Secondary | ICD-10-CM | POA: Diagnosis not present

## 2021-12-15 DIAGNOSIS — I700 Atherosclerosis of aorta: Secondary | ICD-10-CM | POA: Diagnosis not present

## 2021-12-15 DIAGNOSIS — I6521 Occlusion and stenosis of right carotid artery: Secondary | ICD-10-CM | POA: Diagnosis not present

## 2021-12-15 DIAGNOSIS — Z87440 Personal history of urinary (tract) infections: Secondary | ICD-10-CM | POA: Diagnosis not present

## 2021-12-15 DIAGNOSIS — E785 Hyperlipidemia, unspecified: Secondary | ICD-10-CM | POA: Diagnosis not present

## 2021-12-15 DIAGNOSIS — Z85828 Personal history of other malignant neoplasm of skin: Secondary | ICD-10-CM | POA: Diagnosis not present

## 2021-12-15 DIAGNOSIS — I255 Ischemic cardiomyopathy: Secondary | ICD-10-CM | POA: Diagnosis not present

## 2021-12-15 DIAGNOSIS — D509 Iron deficiency anemia, unspecified: Secondary | ICD-10-CM | POA: Diagnosis not present

## 2021-12-17 DIAGNOSIS — I5042 Chronic combined systolic (congestive) and diastolic (congestive) heart failure: Secondary | ICD-10-CM | POA: Diagnosis not present

## 2021-12-17 DIAGNOSIS — Z9181 History of falling: Secondary | ICD-10-CM | POA: Diagnosis not present

## 2021-12-17 DIAGNOSIS — I255 Ischemic cardiomyopathy: Secondary | ICD-10-CM | POA: Diagnosis not present

## 2021-12-17 DIAGNOSIS — Z87891 Personal history of nicotine dependence: Secondary | ICD-10-CM | POA: Diagnosis not present

## 2021-12-17 DIAGNOSIS — I951 Orthostatic hypotension: Secondary | ICD-10-CM | POA: Diagnosis not present

## 2021-12-17 DIAGNOSIS — J441 Chronic obstructive pulmonary disease with (acute) exacerbation: Secondary | ICD-10-CM | POA: Diagnosis not present

## 2021-12-17 DIAGNOSIS — Z87440 Personal history of urinary (tract) infections: Secondary | ICD-10-CM | POA: Diagnosis not present

## 2021-12-17 DIAGNOSIS — Z85828 Personal history of other malignant neoplasm of skin: Secondary | ICD-10-CM | POA: Diagnosis not present

## 2021-12-17 DIAGNOSIS — K6389 Other specified diseases of intestine: Secondary | ICD-10-CM | POA: Diagnosis not present

## 2021-12-17 DIAGNOSIS — N138 Other obstructive and reflux uropathy: Secondary | ICD-10-CM | POA: Diagnosis not present

## 2021-12-17 DIAGNOSIS — D509 Iron deficiency anemia, unspecified: Secondary | ICD-10-CM | POA: Diagnosis not present

## 2021-12-17 DIAGNOSIS — I6521 Occlusion and stenosis of right carotid artery: Secondary | ICD-10-CM | POA: Diagnosis not present

## 2021-12-17 DIAGNOSIS — N1832 Chronic kidney disease, stage 3b: Secondary | ICD-10-CM | POA: Diagnosis not present

## 2021-12-17 DIAGNOSIS — I214 Non-ST elevation (NSTEMI) myocardial infarction: Secondary | ICD-10-CM | POA: Diagnosis not present

## 2021-12-17 DIAGNOSIS — I130 Hypertensive heart and chronic kidney disease with heart failure and stage 1 through stage 4 chronic kidney disease, or unspecified chronic kidney disease: Secondary | ICD-10-CM | POA: Diagnosis not present

## 2021-12-17 DIAGNOSIS — I2510 Atherosclerotic heart disease of native coronary artery without angina pectoris: Secondary | ICD-10-CM | POA: Diagnosis not present

## 2021-12-17 DIAGNOSIS — I700 Atherosclerosis of aorta: Secondary | ICD-10-CM | POA: Diagnosis not present

## 2021-12-17 DIAGNOSIS — Z86010 Personal history of colonic polyps: Secondary | ICD-10-CM | POA: Diagnosis not present

## 2021-12-17 DIAGNOSIS — R338 Other retention of urine: Secondary | ICD-10-CM | POA: Diagnosis not present

## 2021-12-17 DIAGNOSIS — Z7902 Long term (current) use of antithrombotics/antiplatelets: Secondary | ICD-10-CM | POA: Diagnosis not present

## 2021-12-17 DIAGNOSIS — E785 Hyperlipidemia, unspecified: Secondary | ICD-10-CM | POA: Diagnosis not present

## 2021-12-24 DIAGNOSIS — E785 Hyperlipidemia, unspecified: Secondary | ICD-10-CM | POA: Diagnosis not present

## 2021-12-24 DIAGNOSIS — Z87891 Personal history of nicotine dependence: Secondary | ICD-10-CM | POA: Diagnosis not present

## 2021-12-24 DIAGNOSIS — R338 Other retention of urine: Secondary | ICD-10-CM | POA: Diagnosis not present

## 2021-12-24 DIAGNOSIS — Z87440 Personal history of urinary (tract) infections: Secondary | ICD-10-CM | POA: Diagnosis not present

## 2021-12-24 DIAGNOSIS — I130 Hypertensive heart and chronic kidney disease with heart failure and stage 1 through stage 4 chronic kidney disease, or unspecified chronic kidney disease: Secondary | ICD-10-CM | POA: Diagnosis not present

## 2021-12-24 DIAGNOSIS — Z9181 History of falling: Secondary | ICD-10-CM | POA: Diagnosis not present

## 2021-12-24 DIAGNOSIS — Z85828 Personal history of other malignant neoplasm of skin: Secondary | ICD-10-CM | POA: Diagnosis not present

## 2021-12-24 DIAGNOSIS — I214 Non-ST elevation (NSTEMI) myocardial infarction: Secondary | ICD-10-CM | POA: Diagnosis not present

## 2021-12-24 DIAGNOSIS — I6521 Occlusion and stenosis of right carotid artery: Secondary | ICD-10-CM | POA: Diagnosis not present

## 2021-12-24 DIAGNOSIS — Z86010 Personal history of colonic polyps: Secondary | ICD-10-CM | POA: Diagnosis not present

## 2021-12-24 DIAGNOSIS — Z7902 Long term (current) use of antithrombotics/antiplatelets: Secondary | ICD-10-CM | POA: Diagnosis not present

## 2021-12-24 DIAGNOSIS — N138 Other obstructive and reflux uropathy: Secondary | ICD-10-CM | POA: Diagnosis not present

## 2021-12-24 DIAGNOSIS — K6389 Other specified diseases of intestine: Secondary | ICD-10-CM | POA: Diagnosis not present

## 2021-12-24 DIAGNOSIS — I2510 Atherosclerotic heart disease of native coronary artery without angina pectoris: Secondary | ICD-10-CM | POA: Diagnosis not present

## 2021-12-24 DIAGNOSIS — J441 Chronic obstructive pulmonary disease with (acute) exacerbation: Secondary | ICD-10-CM | POA: Diagnosis not present

## 2021-12-24 DIAGNOSIS — N1832 Chronic kidney disease, stage 3b: Secondary | ICD-10-CM | POA: Diagnosis not present

## 2021-12-24 DIAGNOSIS — I5042 Chronic combined systolic (congestive) and diastolic (congestive) heart failure: Secondary | ICD-10-CM | POA: Diagnosis not present

## 2021-12-24 DIAGNOSIS — D509 Iron deficiency anemia, unspecified: Secondary | ICD-10-CM | POA: Diagnosis not present

## 2021-12-24 DIAGNOSIS — I700 Atherosclerosis of aorta: Secondary | ICD-10-CM | POA: Diagnosis not present

## 2021-12-24 DIAGNOSIS — I255 Ischemic cardiomyopathy: Secondary | ICD-10-CM | POA: Diagnosis not present

## 2021-12-24 DIAGNOSIS — I951 Orthostatic hypotension: Secondary | ICD-10-CM | POA: Diagnosis not present

## 2021-12-30 DIAGNOSIS — N1832 Chronic kidney disease, stage 3b: Secondary | ICD-10-CM | POA: Diagnosis not present

## 2021-12-30 DIAGNOSIS — K6389 Other specified diseases of intestine: Secondary | ICD-10-CM | POA: Diagnosis not present

## 2021-12-30 DIAGNOSIS — I214 Non-ST elevation (NSTEMI) myocardial infarction: Secondary | ICD-10-CM | POA: Diagnosis not present

## 2021-12-30 DIAGNOSIS — I5042 Chronic combined systolic (congestive) and diastolic (congestive) heart failure: Secondary | ICD-10-CM | POA: Diagnosis not present

## 2021-12-30 DIAGNOSIS — Z85828 Personal history of other malignant neoplasm of skin: Secondary | ICD-10-CM | POA: Diagnosis not present

## 2021-12-30 DIAGNOSIS — Z87891 Personal history of nicotine dependence: Secondary | ICD-10-CM | POA: Diagnosis not present

## 2021-12-30 DIAGNOSIS — R338 Other retention of urine: Secondary | ICD-10-CM | POA: Diagnosis not present

## 2021-12-30 DIAGNOSIS — Z9181 History of falling: Secondary | ICD-10-CM | POA: Diagnosis not present

## 2021-12-30 DIAGNOSIS — Z86010 Personal history of colonic polyps: Secondary | ICD-10-CM | POA: Diagnosis not present

## 2021-12-30 DIAGNOSIS — I951 Orthostatic hypotension: Secondary | ICD-10-CM | POA: Diagnosis not present

## 2021-12-30 DIAGNOSIS — I2510 Atherosclerotic heart disease of native coronary artery without angina pectoris: Secondary | ICD-10-CM | POA: Diagnosis not present

## 2021-12-30 DIAGNOSIS — Z7902 Long term (current) use of antithrombotics/antiplatelets: Secondary | ICD-10-CM | POA: Diagnosis not present

## 2021-12-30 DIAGNOSIS — Z87440 Personal history of urinary (tract) infections: Secondary | ICD-10-CM | POA: Diagnosis not present

## 2021-12-30 DIAGNOSIS — E785 Hyperlipidemia, unspecified: Secondary | ICD-10-CM | POA: Diagnosis not present

## 2021-12-30 DIAGNOSIS — I130 Hypertensive heart and chronic kidney disease with heart failure and stage 1 through stage 4 chronic kidney disease, or unspecified chronic kidney disease: Secondary | ICD-10-CM | POA: Diagnosis not present

## 2021-12-30 DIAGNOSIS — I700 Atherosclerosis of aorta: Secondary | ICD-10-CM | POA: Diagnosis not present

## 2021-12-30 DIAGNOSIS — D509 Iron deficiency anemia, unspecified: Secondary | ICD-10-CM | POA: Diagnosis not present

## 2021-12-30 DIAGNOSIS — I6521 Occlusion and stenosis of right carotid artery: Secondary | ICD-10-CM | POA: Diagnosis not present

## 2021-12-30 DIAGNOSIS — I255 Ischemic cardiomyopathy: Secondary | ICD-10-CM | POA: Diagnosis not present

## 2021-12-30 DIAGNOSIS — N138 Other obstructive and reflux uropathy: Secondary | ICD-10-CM | POA: Diagnosis not present

## 2021-12-30 DIAGNOSIS — J441 Chronic obstructive pulmonary disease with (acute) exacerbation: Secondary | ICD-10-CM | POA: Diagnosis not present

## 2022-01-05 ENCOUNTER — Other Ambulatory Visit (HOSPITAL_COMMUNITY)
Admission: RE | Admit: 2022-01-05 | Discharge: 2022-01-05 | Disposition: A | Discharge status: Home / Self Care | Payer: Medicare Other | Source: Ambulatory Visit | Attending: Cardiology | Admitting: Cardiology

## 2022-01-05 ENCOUNTER — Inpatient Hospital Stay: Payer: Medicare Other | Attending: Hematology

## 2022-01-05 DIAGNOSIS — I130 Hypertensive heart and chronic kidney disease with heart failure and stage 1 through stage 4 chronic kidney disease, or unspecified chronic kidney disease: Secondary | ICD-10-CM | POA: Diagnosis not present

## 2022-01-05 DIAGNOSIS — Z7982 Long term (current) use of aspirin: Secondary | ICD-10-CM | POA: Diagnosis not present

## 2022-01-05 DIAGNOSIS — D509 Iron deficiency anemia, unspecified: Secondary | ICD-10-CM | POA: Insufficient documentation

## 2022-01-05 DIAGNOSIS — D500 Iron deficiency anemia secondary to blood loss (chronic): Secondary | ICD-10-CM

## 2022-01-05 DIAGNOSIS — E559 Vitamin D deficiency, unspecified: Secondary | ICD-10-CM

## 2022-01-05 DIAGNOSIS — Z87891 Personal history of nicotine dependence: Secondary | ICD-10-CM | POA: Insufficient documentation

## 2022-01-05 DIAGNOSIS — Z79899 Other long term (current) drug therapy: Secondary | ICD-10-CM | POA: Insufficient documentation

## 2022-01-05 DIAGNOSIS — Z7902 Long term (current) use of antithrombotics/antiplatelets: Secondary | ICD-10-CM | POA: Diagnosis not present

## 2022-01-05 DIAGNOSIS — N183 Chronic kidney disease, stage 3 unspecified: Secondary | ICD-10-CM | POA: Insufficient documentation

## 2022-01-05 LAB — CBC WITH DIFFERENTIAL/PLATELET
Abs Immature Granulocytes: 0.02 10*3/uL (ref 0.00–0.07)
Basophils Absolute: 0.1 10*3/uL (ref 0.0–0.1)
Basophils Relative: 1 %
Eosinophils Absolute: 0.3 10*3/uL (ref 0.0–0.5)
Eosinophils Relative: 3 %
HCT: 36.5 % — ABNORMAL LOW (ref 39.0–52.0)
Hemoglobin: 10.9 g/dL — ABNORMAL LOW (ref 13.0–17.0)
Immature Granulocytes: 0 %
Lymphocytes Relative: 17 %
Lymphs Abs: 1.5 10*3/uL (ref 0.7–4.0)
MCH: 24.4 pg — ABNORMAL LOW (ref 26.0–34.0)
MCHC: 29.9 g/dL — ABNORMAL LOW (ref 30.0–36.0)
MCV: 81.7 fL (ref 80.0–100.0)
Monocytes Absolute: 0.9 10*3/uL (ref 0.1–1.0)
Monocytes Relative: 10 %
Neutro Abs: 6.2 10*3/uL (ref 1.7–7.7)
Neutrophils Relative %: 69 %
Platelets: 342 10*3/uL (ref 150–400)
RBC: 4.47 MIL/uL (ref 4.22–5.81)
RDW: 16 % — ABNORMAL HIGH (ref 11.5–15.5)
WBC: 8.9 10*3/uL (ref 4.0–10.5)
nRBC: 0 % (ref 0.0–0.2)

## 2022-01-05 LAB — BASIC METABOLIC PANEL
Anion gap: 8 (ref 5–15)
BUN: 25 mg/dL — ABNORMAL HIGH (ref 8–23)
CO2: 26 mmol/L (ref 22–32)
Calcium: 8.9 mg/dL (ref 8.9–10.3)
Chloride: 105 mmol/L (ref 98–111)
Creatinine, Ser: 1.65 mg/dL — ABNORMAL HIGH (ref 0.61–1.24)
GFR, Estimated: 45 mL/min — ABNORMAL LOW (ref >60)
Glucose, Bld: 82 mg/dL (ref 70–99)
Potassium: 3.9 mmol/L (ref 3.5–5.1)
Sodium: 139 mmol/L (ref 135–145)

## 2022-01-05 LAB — FERRITIN: Ferritin: 26 ng/mL (ref 24–336)

## 2022-01-05 LAB — IRON AND TIBC
Iron: 30 ug/dL — ABNORMAL LOW (ref 45–182)
Saturation Ratios: 6 % — ABNORMAL LOW (ref 17.9–39.5)
TIBC: 470 ug/dL — ABNORMAL HIGH (ref 250–450)
UIBC: 440 ug/dL

## 2022-01-05 LAB — VITAMIN D 25 HYDROXY (VIT D DEFICIENCY, FRACTURES): Vit D, 25-Hydroxy: 35.06 ng/mL (ref 30–100)

## 2022-01-06 DIAGNOSIS — Z87891 Personal history of nicotine dependence: Secondary | ICD-10-CM | POA: Diagnosis not present

## 2022-01-06 DIAGNOSIS — Z87440 Personal history of urinary (tract) infections: Secondary | ICD-10-CM | POA: Diagnosis not present

## 2022-01-06 DIAGNOSIS — N138 Other obstructive and reflux uropathy: Secondary | ICD-10-CM | POA: Diagnosis not present

## 2022-01-06 DIAGNOSIS — I2510 Atherosclerotic heart disease of native coronary artery without angina pectoris: Secondary | ICD-10-CM | POA: Diagnosis not present

## 2022-01-06 DIAGNOSIS — Z9181 History of falling: Secondary | ICD-10-CM | POA: Diagnosis not present

## 2022-01-06 DIAGNOSIS — I6521 Occlusion and stenosis of right carotid artery: Secondary | ICD-10-CM | POA: Diagnosis not present

## 2022-01-06 DIAGNOSIS — Z86010 Personal history of colonic polyps: Secondary | ICD-10-CM | POA: Diagnosis not present

## 2022-01-06 DIAGNOSIS — I255 Ischemic cardiomyopathy: Secondary | ICD-10-CM | POA: Diagnosis not present

## 2022-01-06 DIAGNOSIS — R338 Other retention of urine: Secondary | ICD-10-CM | POA: Diagnosis not present

## 2022-01-06 DIAGNOSIS — I5042 Chronic combined systolic (congestive) and diastolic (congestive) heart failure: Secondary | ICD-10-CM | POA: Diagnosis not present

## 2022-01-06 DIAGNOSIS — K6389 Other specified diseases of intestine: Secondary | ICD-10-CM | POA: Diagnosis not present

## 2022-01-06 DIAGNOSIS — D509 Iron deficiency anemia, unspecified: Secondary | ICD-10-CM | POA: Diagnosis not present

## 2022-01-06 DIAGNOSIS — J441 Chronic obstructive pulmonary disease with (acute) exacerbation: Secondary | ICD-10-CM | POA: Diagnosis not present

## 2022-01-06 DIAGNOSIS — Z7902 Long term (current) use of antithrombotics/antiplatelets: Secondary | ICD-10-CM | POA: Diagnosis not present

## 2022-01-06 DIAGNOSIS — I951 Orthostatic hypotension: Secondary | ICD-10-CM | POA: Diagnosis not present

## 2022-01-06 DIAGNOSIS — I130 Hypertensive heart and chronic kidney disease with heart failure and stage 1 through stage 4 chronic kidney disease, or unspecified chronic kidney disease: Secondary | ICD-10-CM | POA: Diagnosis not present

## 2022-01-06 DIAGNOSIS — I214 Non-ST elevation (NSTEMI) myocardial infarction: Secondary | ICD-10-CM | POA: Diagnosis not present

## 2022-01-06 DIAGNOSIS — N1832 Chronic kidney disease, stage 3b: Secondary | ICD-10-CM | POA: Diagnosis not present

## 2022-01-06 DIAGNOSIS — Z85828 Personal history of other malignant neoplasm of skin: Secondary | ICD-10-CM | POA: Diagnosis not present

## 2022-01-06 DIAGNOSIS — E785 Hyperlipidemia, unspecified: Secondary | ICD-10-CM | POA: Diagnosis not present

## 2022-01-06 DIAGNOSIS — I700 Atherosclerosis of aorta: Secondary | ICD-10-CM | POA: Diagnosis not present

## 2022-01-11 ENCOUNTER — Telehealth: Payer: Self-pay | Admitting: *Deleted

## 2022-01-11 ENCOUNTER — Ambulatory Visit (INDEPENDENT_AMBULATORY_CARE_PROVIDER_SITE_OTHER): Payer: Medicare Other | Admitting: Internal Medicine

## 2022-01-11 ENCOUNTER — Encounter: Payer: Self-pay | Admitting: Internal Medicine

## 2022-01-11 VITALS — BP 177/116 | HR 73 | Temp 98.1°F | Ht 72.0 in | Wt 152.0 lb

## 2022-01-11 DIAGNOSIS — D369 Benign neoplasm, unspecified site: Secondary | ICD-10-CM | POA: Diagnosis not present

## 2022-01-11 DIAGNOSIS — R948 Abnormal results of function studies of other organs and systems: Secondary | ICD-10-CM

## 2022-01-11 DIAGNOSIS — K8689 Other specified diseases of pancreas: Secondary | ICD-10-CM

## 2022-01-11 NOTE — Progress Notes (Signed)
Primary Care Physician:  [name], MD Primary Gastroenterologist:  Dr.   Pre-Procedure History & Physical: HPI:  Michael [name] is a 66 y.o. male here for follow-up of IDA/multiple advanced adenomas in his right colon recently pancreatic ductal dilation on CT.  Patient was lost to follow-up  - came back to see [place] earlier in the year  - was noted to previously have multiple advanced adenomas in his right colon.  Repeat colonoscopy Dr. Abbey Chatters confirmdc;  ultimately, patient saw Dr. Curlene Labrum.  He underwent a right hemicolectomy.  Fortunately for him, no invasive cancer found -  only high-grade dysplasia.  He recovered from surgery uneventfully. He admitted for syncope and lightheadedness/hypoxemia.  The setting of COPD and chronic heart failure.  CT of the chest was negative for PE.  Pancreatic ductal calcification with dilation of the pancreatic duct noted.  A new finding.  He was referred here for further evaluation.  Patient denies a history of pancreatitis; endorses heavy alcohol use many many years ago.  Postoperatively, patient has done remarkably well; he has good bowel function; he denies abdominal pain;  appetite good.  He has been able to eat.  He is down about 10 pounds compared to his preop weight earlier this year.  Clinically, no history of pancreatitis.  Past Medical History:  Diagnosis Date   Aortic atherosclerosis ([place]) 08/03/2021   CAD (coronary artery disease) 08/03/2021   Cardiac catheterization September 2023 with RCA/RV marginal stenosis managed medically   Cardiomyopathy ([place])    Carotid artery disease ([place])    CKD (chronic kidney disease) stage 3, GFR 30-59 ml/min (HCC)    COPD (chronic obstructive pulmonary disease) (HCC)    Essential hypertension    Head and neck cancer ([place]) 2019   Right facial basal cell carcinoma s/p resection with right partial mastectomy and partal rhinectomy with skin graft (06/13/17)   Iron deficiency anemia    Urinary retention      Past Surgical History:  Procedure Laterality Date   ANKLE CLOSED REDUCTION Right    open reduction   BASAL CELL CARCINOMA EXCISION  [facility]   [facility]   BIOPSY  07/19/2021   Procedure: BIOPSY;  Surgeon: Eloise Harman, DO;  [facility];  Service: Endoscopy;;   COLONOSCOPY N/A 05/31/2017   Procedure: COLONOSCOPY;  Surgeon: Daneil Dolin, MD;  [facility];  Service: Endoscopy;  Laterality: N/A;  2:45pm   COLONOSCOPY WITH PROPOFOL N/A 07/19/2021   Procedure: COLONOSCOPY WITH PROPOFOL;  Surgeon: Eloise Harman, DO;  [facility];  Service: Endoscopy;  Laterality: N/A;  3:00pm, moved up to 9:00   CYSTOSCOPY N/A 10/29/2018   Procedure: CYSTOSCOPY, CLOT EVACUATION;  Surgeon: Ceasar Mons, MD;  [facility];  Service: Urology;  Laterality: N/A;   PARTIAL COLECTOMY Right 08/11/2021   Procedure: PARTIAL COLECTOMY, OPEN RIGHT HEMICOLECTOMY;  Surgeon: Virl Cagey, MD;  [facility];  Service: General;  Laterality: Right;   POLYPECTOMY  05/31/2017   Procedure: POLYPECTOMY;  Surgeon: Daneil Dolin, MD;  [facility];  Service: Endoscopy;;   RIGHT/LEFT HEART CATH AND CORONARY ANGIOGRAPHY N/A 10/22/2021   Procedure: RIGHT/LEFT HEART CATH AND CORONARY ANGIOGRAPHY;  Surgeon: Early Osmond, MD;  Location: [facility];  Service: Cardiovascular;  Laterality: N/A;   TONSILLECTOMY     TRANSCAROTID ARTERY REVASCULARIZATION  Right 02/15/2021   Procedure: RIGHT TRANSCAROTID ARTERY REVASCULARIZATION;  Surgeon: [name] Heck, MD;  [facility];  Service: Vascular;  Laterality: Right;   ULTRASOUND GUIDANCE FOR VASCULAR ACCESS Left 02/15/2021   Procedure: ULTRASOUND GUIDANCE FOR VASCULAR ACCESS;  Surgeon: [name] Heck, MD;  Location: [place];  Service: Vascular;  Laterality: Left;   XI ROBOTIC ASSISTED SIMPLE PROSTATECTOMY N/A 10/29/2018   Procedure: XI ROBOTIC ASSISTED SIMPLE PROSTATECTOMY;  Surgeon: Cleon Gustin, MD;  [facility];  Service: Urology;  Laterality: N/A;    Prior to Admission medications   Medication Sig Start Date End Date Taking? Authorizing Provider  albuterol (VENTOLIN HFA) 108 (90 Base) MCG/ACT inhaler Inhale 2 puffs into the lungs every 6 (six) hours as needed for wheezing or shortness of breath. 08/14/21  Yes Pappayliou, Barnetta Chapel A, DO  amLODipine (NORVASC) 5 MG tablet Take 5 mg by mouth daily. 10/12/21  Yes [provider]  aspirin EC 81 MG tablet Take 81 mg by mouth daily.   Yes [provider]  atorvastatin (LIPITOR) 80 MG tablet Take 1 tablet (80 mg total) by mouth every evening. 08/10/21  Yes Swinyer, Lanice Schwab, NP  Budeson-Glycopyrrol-Formoterol (BREZTRI AEROSPHERE) 160-9-4.8 MCG/ACT AERO Inhale 2 puffs into the lungs 2 (two) times daily.   Yes [provider]  carvedilol (COREG) 12.5 MG tablet Take 1 tablet (12.5 mg total) by mouth 2 (two) times daily with a meal. 10/26/21  Yes Aline August, MD  clopidogrel (PLAVIX) 75 MG tablet Take 1 tablet (75 mg total) by mouth daily. 10/26/21  Yes Aline August, MD  ergocalciferol (VITAMIN D2) 1.25 MG (50000 UT) capsule Take 1 capsule (50,000 Units total) by mouth once a week. 08/26/21  Yes Pennington, Rebekah M, PA-C  Evolocumab (REPATHA SURECLICK) 149 MG/ML SOAJ Inject 1 pen into the skin every 14 (fourteen) days. 03/09/21  Yes Satira Sark, MD  furosemide (LASIX) 40 MG tablet Take 1 tablet (40 mg total) by mouth daily. 10/26/21 12/15/22 Yes Aline August, MD  isosorbide mononitrate (IMDUR) 60 MG 24 hr tablet Take 1 tablet (60 mg total) by mouth every evening. 11/09/21  Yes Florencia Reasons, MD  potassium chloride SA (KLOR-CON M) 20 MEQ tablet Take 1 tablet (20 mEq total) by mouth 2 (two) times daily. 10/26/21  Yes Aline August, MD  sacubitril-valsartan (ENTRESTO) 24-26 MG Take 1 tablet by mouth 2 (two) times daily. 12/14/21  Yes Satira Sark, MD    Allergies as of 01/11/2022   (No Known Allergies)     Family History  Problem Relation Age of Onset   Stroke Father    Cirrhosis Mother    Colon cancer Neg Hx     Social History   Socioeconomic History   Marital status: Married    Spouse name: Not on file   Number of children: Not on file   Years of education: Not on file   Highest education level: Not on file  Occupational History   Not on file  Tobacco Use   Smoking status: Former    Packs/day: 0.50    Years: 40.00    Total pack years: 20.00    Types: Cigarettes    Quit date: 07/02/2021    Years since quitting: 0.5    Passive exposure: Never   Smokeless tobacco: Never   Tobacco comments:    Quit on 06/09/21  Vaping Use   Vaping Use: Never used  Substance and Sexual Activity   Alcohol use: Not Currently   Drug use: Never   Sexual activity: Not Currently  Other Topics Concern   Not on  file  Social History Narrative   Not on file   Social Determinants of Health   Financial Resource Strain: Low Risk  (12/11/2019)   Overall Financial Resource Strain (CARDIA)    Difficulty of Paying Living Expenses: Not hard at all  Food Insecurity: No Food Insecurity (10/20/2021)   Hunger Vital Sign    Worried About Running Out of Food in the Last Year: Never true    Ran Out of Food in the Last Year: Never true  Transportation Needs: No Transportation Needs (10/20/2021)   PRAPARE - Hydrologist (Medical): No    Lack of Transportation (Non-Medical): No  Physical Activity: Inactive (12/11/2019)   Exercise Vital Sign    Days of Exercise per Week: 0 days    Minutes of Exercise per Session: 0 min  Stress: No Stress Concern Present (12/11/2019)   [place]    Feeling of Stress : Not at all  Social Connections: Socially Isolated (12/11/2019)   Social Connection and Isolation Panel [NHANES]    Frequency of Communication with Friends and Family: Never    Frequency of Social Gatherings with  Friends and Family: Never    Attends Religious Services: Never    Marine scientist or Organizations: No    Attends Archivist Meetings: Never    Marital Status: Divorced  Human resources officer Violence: Not At Risk (10/20/2021)   Humiliation, Afraid, Rape, and Kick questionnaire    Fear of Current or Ex-Partner: No    Emotionally Abused: No    Physically Abused: No    Sexually Abused: No    Review of Systems: See HPI, otherwise negative ROS  Physical Exam: BP (!) 174/100 (BP Location: Right Arm, Patient Position: Sitting, Cuff Size: Normal)   Pulse 84   Temp 98.1 F (36.7 C) (Oral)   Ht 6' (1.829 m)   Wt 152 lb (68.9 kg)   SpO2 99%   BMI 20.61 kg/m  General:   Alert,   pleasant and cooperative in NAD.  Nasal deformity from prior surgery/cancer Neck:  Supple; no masses or thyromegaly. No significant cervical adenopathy. Lungs:  Clear throughout to auscultation.   No wheezes, crackles, or rhonchi. No acute distress. Heart:  Regular rate and rhythm; no murmurs, clicks, rubs,  or gallops. Abdomen: Non-distended, normal bowel sounds.  Soft and nontender without appreciable mass or hepatosplenomegaly.  Pulses:  Normal pulses noted. Extremities:  Without clubbing or edema.  Impression/Plan: Very pleasant 66 year old gentleman with a history of head and neck cancer, advanced right colon adenomas  -  status post right hemicolectomy earlier this year.  He has done very well from his bowel surgery in spite of comorbidities. Recent CT of the chest suggested new pancreatic calcifications with dilation of the main bile duct.  This is of uncertain significance.  Clinically, he has not had pancreatitis.  Distant history of EtOH abuse.  Recommendations:  Pancreatic protocol CT to evaluate abnormal organ on recent chest CT.    Blood pressure was elevated today.  Will be rechecked.  Consider surveillance colonoscopy in the summer 2024 if overall health permits.  Further  recommendations to follow.    Notice: This dictation was prepared with Dragon dictation along with smaller phrase technology. Any transcriptional errors that result from this process are unintentional and may not be corrected upon review.

## 2022-01-11 NOTE — Telephone Encounter (Signed)
Pt has been scheduled for CT scan for pancreas on 01/25/22 at 9:00 am, arrive at 8:45 am, nothing to eat or drink 4 hours prior.   Awaiting to hear back from provider if he wants the oral contrast vs drinking 2 cups of water

## 2022-01-11 NOTE — Patient Instructions (Signed)
It was good to see you again today!  You seem to be doing very well from your colon surgery  Because your pancreas looks abnormal on a recent CAT scan.,  You need a special CAT scan focusing on the pancreas.  If your renal function is too low to have a CT scan then we would refer you for endoscopic ultrasound in [place].  Be back in touch with you once I get the CT back  Pressure was elevated today.  Will be rechecked.

## 2022-01-11 NOTE — Telephone Encounter (Signed)
This member's benefit plan did not require a prior authorization for this request.   Please print and retain a copy of this confirmation in the patient's medical record.

## 2022-01-12 ENCOUNTER — Inpatient Hospital Stay (HOSPITAL_BASED_OUTPATIENT_CLINIC_OR_DEPARTMENT_OTHER): Payer: Medicare Other | Admitting: Physician Assistant

## 2022-01-12 VITALS — BP 182/98 | HR 88 | Temp 98.4°F | Resp 18 | Ht 72.0 in | Wt 148.7 lb

## 2022-01-12 DIAGNOSIS — I130 Hypertensive heart and chronic kidney disease with heart failure and stage 1 through stage 4 chronic kidney disease, or unspecified chronic kidney disease: Secondary | ICD-10-CM | POA: Diagnosis not present

## 2022-01-12 DIAGNOSIS — I5042 Chronic combined systolic (congestive) and diastolic (congestive) heart failure: Secondary | ICD-10-CM | POA: Diagnosis not present

## 2022-01-12 DIAGNOSIS — R338 Other retention of urine: Secondary | ICD-10-CM | POA: Diagnosis not present

## 2022-01-12 DIAGNOSIS — I700 Atherosclerosis of aorta: Secondary | ICD-10-CM | POA: Diagnosis not present

## 2022-01-12 DIAGNOSIS — I6521 Occlusion and stenosis of right carotid artery: Secondary | ICD-10-CM | POA: Diagnosis not present

## 2022-01-12 DIAGNOSIS — Z87891 Personal history of nicotine dependence: Secondary | ICD-10-CM | POA: Diagnosis not present

## 2022-01-12 DIAGNOSIS — N138 Other obstructive and reflux uropathy: Secondary | ICD-10-CM | POA: Diagnosis not present

## 2022-01-12 DIAGNOSIS — K6389 Other specified diseases of intestine: Secondary | ICD-10-CM | POA: Diagnosis not present

## 2022-01-12 DIAGNOSIS — J441 Chronic obstructive pulmonary disease with (acute) exacerbation: Secondary | ICD-10-CM | POA: Diagnosis not present

## 2022-01-12 DIAGNOSIS — I2510 Atherosclerotic heart disease of native coronary artery without angina pectoris: Secondary | ICD-10-CM | POA: Diagnosis not present

## 2022-01-12 DIAGNOSIS — Z85828 Personal history of other malignant neoplasm of skin: Secondary | ICD-10-CM | POA: Diagnosis not present

## 2022-01-12 DIAGNOSIS — Z7902 Long term (current) use of antithrombotics/antiplatelets: Secondary | ICD-10-CM | POA: Diagnosis not present

## 2022-01-12 DIAGNOSIS — Z9181 History of falling: Secondary | ICD-10-CM | POA: Diagnosis not present

## 2022-01-12 DIAGNOSIS — Z87440 Personal history of urinary (tract) infections: Secondary | ICD-10-CM | POA: Diagnosis not present

## 2022-01-12 DIAGNOSIS — I214 Non-ST elevation (NSTEMI) myocardial infarction: Secondary | ICD-10-CM | POA: Diagnosis not present

## 2022-01-12 DIAGNOSIS — N1832 Chronic kidney disease, stage 3b: Secondary | ICD-10-CM | POA: Diagnosis not present

## 2022-01-12 DIAGNOSIS — I951 Orthostatic hypotension: Secondary | ICD-10-CM | POA: Diagnosis not present

## 2022-01-12 DIAGNOSIS — E785 Hyperlipidemia, unspecified: Secondary | ICD-10-CM | POA: Diagnosis not present

## 2022-01-12 DIAGNOSIS — Z91198 Patient's noncompliance with other medical treatment and regimen for other reason: Secondary | ICD-10-CM

## 2022-01-12 DIAGNOSIS — Z86010 Personal history of colonic polyps: Secondary | ICD-10-CM | POA: Diagnosis not present

## 2022-01-12 DIAGNOSIS — D509 Iron deficiency anemia, unspecified: Secondary | ICD-10-CM | POA: Diagnosis not present

## 2022-01-12 DIAGNOSIS — I255 Ischemic cardiomyopathy: Secondary | ICD-10-CM | POA: Diagnosis not present

## 2022-01-12 NOTE — Telephone Encounter (Signed)
Pt will need to drink the oral contrast. He needs to arrive at 7 am on 01/25/22, NPO 4 hours prior.  [place]

## 2022-01-12 NOTE — Progress Notes (Signed)
BRIEF NONBILLABLE PROGRESS NOTE: Patient was initially scheduled for office visit today, but this was changed to phone visit per provider request due to scheduling change.  However, when I called patient, it was not a convenient time for him, and he requests that the visit be rescheduled tomorrow as an in person visit rather than a telephone visit.  We will certainly accommodate this, and I look forward to speaking with him face-to-face tomorrow.  Harriett Rush, PA-C 01/12/22 4:48 PM

## 2022-01-13 ENCOUNTER — Inpatient Hospital Stay (HOSPITAL_BASED_OUTPATIENT_CLINIC_OR_DEPARTMENT_OTHER): Payer: Medicare Other | Admitting: Physician Assistant

## 2022-01-13 ENCOUNTER — Other Ambulatory Visit: Payer: Self-pay

## 2022-01-13 ENCOUNTER — Telehealth: Payer: Self-pay | Admitting: *Deleted

## 2022-01-13 DIAGNOSIS — Z87891 Personal history of nicotine dependence: Secondary | ICD-10-CM | POA: Diagnosis not present

## 2022-01-13 DIAGNOSIS — D500 Iron deficiency anemia secondary to blood loss (chronic): Secondary | ICD-10-CM

## 2022-01-13 DIAGNOSIS — Z7902 Long term (current) use of antithrombotics/antiplatelets: Secondary | ICD-10-CM | POA: Diagnosis not present

## 2022-01-13 DIAGNOSIS — N183 Chronic kidney disease, stage 3 unspecified: Secondary | ICD-10-CM | POA: Diagnosis not present

## 2022-01-13 DIAGNOSIS — E559 Vitamin D deficiency, unspecified: Secondary | ICD-10-CM

## 2022-01-13 DIAGNOSIS — Z79899 Other long term (current) drug therapy: Secondary | ICD-10-CM | POA: Diagnosis not present

## 2022-01-13 DIAGNOSIS — C44311 Basal cell carcinoma of skin of nose: Secondary | ICD-10-CM

## 2022-01-13 DIAGNOSIS — I130 Hypertensive heart and chronic kidney disease with heart failure and stage 1 through stage 4 chronic kidney disease, or unspecified chronic kidney disease: Secondary | ICD-10-CM | POA: Diagnosis not present

## 2022-01-13 DIAGNOSIS — Z7982 Long term (current) use of aspirin: Secondary | ICD-10-CM | POA: Diagnosis not present

## 2022-01-13 DIAGNOSIS — D509 Iron deficiency anemia, unspecified: Secondary | ICD-10-CM | POA: Diagnosis not present

## 2022-01-13 MED ORDER — ERGOCALCIFEROL 1.25 MG (50000 UT) PO CAPS: 50000.0000 [IU] | ORAL_CAPSULE | ORAL | 6 refills | Status: DC

## 2022-01-13 NOTE — Progress Notes (Signed)
[facility], [place] [address]   CLINIC:  Medical Oncology/Hematology  PCP:  [name], MD [place] [address] [phone]   CURRENT THERAPY: Intermittent IV iron (most recent IV Venofer 300 mg x 3 from 09/13/2021 through 09/28/2021)   INTERVAL HISTORY:  Michael [name] 66 y.o. male returns for routine follow-up of his iron deficiency anemia.  He was last seen by Tarri Abernethy PA-C on 08/26/2021.   BRIEF SUMMARY OF RECENT MEDICAL EVENTS: - Right hemicolectomy for right colon mass on 08/11/2021.   Surgical pathology showed multiple tubular adenomas, several of which showed focal high-grade dysplasia, but were negative for invasive carcinoma. - Hospitalized 10/20/2021 through 10/26/2021 for acute on chronic systolic/diastolic heart failure - Hospitalized 11/08/2018 through 11/09/21 for chest pain and syncope - Incidental finding on CT scan showed pancreatic ductal calcification with dilation of pancreatic duct noted, referred to gastroenterology for further workup, with orders placed for pancreatic protocol CT.   At today's visit, he reports feeling fair.  He reports moderate fatigue with energy at about 50%.  He reports that his bowel movements are generally dark, and he denies any obvious bleeding such as bright blood per rectum or gross melena.  He has positional vertigo occasionally, especially with laying down.  No pica, restless legs, headaches, chest pain,  lightheadedness, or syncope.  He has chronic dyspnea on exertion.   Patient reports low appetite.  He eats about 2 meals daily, denies any nausea, vomiting, abdominal pain, diarrhea.  He tried drinking Ensure, but "did not love the taste."   Weight today is 148 pounds, which is overall stable compared to his last office visit.     REVIEW OF SYSTEMS:  Review of Systems  Constitutional:  Positive for fatigue. Negative for appetite change, chills, diaphoresis, fever and  unexpected weight change.  HENT:   Negative for lump/mass and nosebleeds.   Eyes:  Negative for eye problems.  Respiratory:  Positive for shortness of breath. Negative for cough and hemoptysis.   Cardiovascular:  Negative for chest pain, leg swelling and palpitations.  Gastrointestinal:  Positive for constipation. Negative for abdominal pain, blood in stool, diarrhea, nausea and vomiting.  Genitourinary:  Negative for hematuria.   Skin: Negative.   Neurological:  Negative for dizziness, headaches and light-headedness.  Hematological:  Does not bruise/bleed easily.      PAST MEDICAL/SURGICAL HISTORY:  Past Medical History:  Diagnosis Date   Aortic atherosclerosis ([facility]) 08/03/2021   CAD (coronary artery disease) 08/03/2021   Cardiac catheterization September 2023 with RCA/RV marginal stenosis managed medically   Cardiomyopathy ([facility])    Carotid artery disease ([facility])    CKD (chronic kidney disease) stage 3, GFR 30-59 ml/min (HCC)    COPD (chronic obstructive pulmonary disease) (HCC)    Essential hypertension    Head and neck cancer ([facility]) 2019   Right facial basal cell carcinoma s/p resection with right partial mastectomy and partal rhinectomy with skin graft (06/13/17)   Iron deficiency anemia    Urinary retention    Past Surgical History:  Procedure Laterality Date   ANKLE CLOSED REDUCTION Right    open reduction   BASAL CELL CARCINOMA EXCISION  2019   at [facility]   BIOPSY  07/19/2021   Procedure: BIOPSY;  Surgeon: Eloise Harman, DO;  [facility];  Service: Endoscopy;;   COLONOSCOPY N/A 05/31/2017   Procedure: COLONOSCOPY;  Surgeon: Daneil Dolin, MD;  [facility];  Service: Endoscopy;  Laterality: N/A;  2:45pm   COLONOSCOPY WITH PROPOFOL N/A 07/19/2021   Procedure: COLONOSCOPY WITH PROPOFOL;  Surgeon: Eloise Harman, DO;  [facility];  Service: Endoscopy;  Laterality: N/A;  3:00pm, moved up to 9:00   CYSTOSCOPY N/A 10/29/2018   Procedure:  CYSTOSCOPY, CLOT EVACUATION;  Surgeon: Ceasar Mons, MD;  [facility]: [facility];  Service: Urology;  Laterality: N/A;   PARTIAL COLECTOMY Right 08/11/2021   Procedure: PARTIAL COLECTOMY, OPEN RIGHT HEMICOLECTOMY;  Surgeon: Virl Cagey, MD;  Location: [facility];  Service: General;  Laterality: Right;   POLYPECTOMY  05/31/2017   Procedure: POLYPECTOMY;  Surgeon: Daneil Dolin, MD;  [facility];  Service: Endoscopy;;   RIGHT/LEFT HEART CATH AND CORONARY ANGIOGRAPHY N/A 10/22/2021   Procedure: RIGHT/LEFT HEART CATH AND CORONARY ANGIOGRAPHY;  Surgeon: Early Osmond, MD;  Location: [facility];  Service: Cardiovascular;  Laterality: N/A;   TONSILLECTOMY     TRANSCAROTID ARTERY REVASCULARIZATION  Right 02/15/2021   Procedure: RIGHT TRANSCAROTID ARTERY REVASCULARIZATION;  Surgeon: [name] Heck, MD;  Location: [facility];  Service: Vascular;  Laterality: Right;   ULTRASOUND GUIDANCE FOR VASCULAR ACCESS Left 02/15/2021   Procedure: ULTRASOUND GUIDANCE FOR VASCULAR ACCESS;  Surgeon: [name] Heck, MD;  Location: [facility];  Service: Vascular;  Laterality: Left;   XI ROBOTIC ASSISTED SIMPLE PROSTATECTOMY N/A 10/29/2018   Procedure: XI ROBOTIC ASSISTED SIMPLE PROSTATECTOMY;  Surgeon: Cleon Gustin, MD;  [facility];  Service: Urology;  Laterality: N/A;     SOCIAL HISTORY:  Social History   Socioeconomic History   Marital status: Married    Spouse name: Not on file   Number of children: Not on file   Years of education: Not on file   Highest education level: Not on file  Occupational History   Not on file  Tobacco Use   Smoking status: Former    Packs/day: 0.50    Years: 40.00    Total pack years: 20.00    Types: Cigarettes    Quit date: 07/02/2021    Years since quitting: 0.5    Passive exposure: Never   Smokeless tobacco: Never   Tobacco comments:    Quit on 06/09/21  Vaping Use   Vaping Use: Never used  Substance and Sexual Activity    Alcohol use: Not Currently   Drug use: Never   Sexual activity: Not Currently  Other Topics Concern   Not on file  Social History Narrative   Not on file   Social Determinants of Health   Financial Resource Strain: Low Risk  (12/11/2019)   Overall Financial Resource Strain (CARDIA)    Difficulty of Paying Living Expenses: Not hard at all  Food Insecurity: No Food Insecurity (10/20/2021)   Hunger Vital Sign    Worried About Running Out of Food in the Last Year: Never true    [place] in the Last Year: Never true  Transportation Needs: No Transportation Needs (10/20/2021)   PRAPARE - Hydrologist (Medical): No    Lack of Transportation (Non-Medical): No  Physical Activity: Inactive (12/11/2019)   Exercise Vital Sign    Days of Exercise per Week: 0 days    Minutes of Exercise per Session: 0 min  Stress: No Stress Concern Present (12/11/2019)   [place]    Feeling of Stress : Not at all  Social Connections:  Socially Isolated (12/11/2019)   Social Connection and Isolation Panel [NHANES]    Frequency of Communication with Friends and Family: Never    Frequency of Social Gatherings with Friends and Family: Never    Attends Religious Services: Never    Marine scientist or Organizations: No    Attends Archivist Meetings: Never    Marital Status: Divorced  Human resources officer Violence: Not At Risk (10/20/2021)   Humiliation, Afraid, Rape, and Kick questionnaire    Fear of Current or Ex-Partner: No    Emotionally Abused: No    Physically Abused: No    Sexually Abused: No    FAMILY HISTORY:  Family History  Problem Relation Age of Onset   Stroke Father    Cirrhosis Mother    Colon cancer Neg Hx     CURRENT MEDICATIONS:  Outpatient Encounter Medications as of 01/13/2022  Medication Sig Note   albuterol (VENTOLIN HFA) 108 (90 Base) MCG/ACT inhaler Inhale 2 puffs  into the lungs every 6 (six) hours as needed for wheezing or shortness of breath.    amLODipine (NORVASC) 5 MG tablet Take 5 mg by mouth daily.    aspirin EC 81 MG tablet Take 81 mg by mouth daily.    atorvastatin (LIPITOR) 80 MG tablet Take 1 tablet (80 mg total) by mouth every evening.    Budeson-Glycopyrrol-Formoterol (BREZTRI AEROSPHERE) 160-9-4.8 MCG/ACT AERO Inhale 2 puffs into the lungs 2 (two) times daily.    carvedilol (COREG) 12.5 MG tablet Take 1 tablet (12.5 mg total) by mouth 2 (two) times daily with a meal.    clopidogrel (PLAVIX) 75 MG tablet Take 1 tablet (75 mg total) by mouth daily.    ergocalciferol (VITAMIN D2) 1.25 MG (50000 UT) capsule Take 1 capsule (50,000 Units total) by mouth once a week.    Evolocumab (REPATHA SURECLICK) 628 MG/ML SOAJ Inject 1 pen into the skin every 14 (fourteen) days. 10/20/2021:   07/26/2021 140 MG/ML SOAJ (disp 2, 28d supply)      furosemide (LASIX) 40 MG tablet Take 1 tablet (40 mg total) by mouth daily.    isosorbide mononitrate (IMDUR) 60 MG 24 hr tablet Take 1 tablet (60 mg total) by mouth every evening.    potassium chloride SA (KLOR-CON M) 20 MEQ tablet Take 1 tablet (20 mEq total) by mouth 2 (two) times daily.    sacubitril-valsartan (ENTRESTO) 24-26 MG Take 1 tablet by mouth 2 (two) times daily.    No facility-administered encounter medications on file as of 01/13/2022.    ALLERGIES:  No Known Allergies   PHYSICAL EXAM:  ECOG PERFORMANCE STATUS: 1 - Symptomatic but completely ambulatory  There were no vitals filed for this visit. There were no vitals filed for this visit. Physical Exam Constitutional:      Appearance: Normal appearance.  HENT:     Head: Normocephalic and atraumatic.     Comments: Nasal deformity (right side) following resection of skin cancer    Mouth/Throat:     Mouth: Mucous membranes are moist.  Eyes:     Extraocular Movements: Extraocular movements intact.     Pupils: Pupils are equal, round, and  reactive to light.  Cardiovascular:     Rate and Rhythm: Normal rate and regular rhythm.     Pulses: Normal pulses.     Heart sounds: Murmur (faint) heard.  Pulmonary:     Effort: Pulmonary effort is normal.     Breath sounds: Normal breath sounds. Decreased air movement present.  Abdominal:     General: Bowel sounds are normal.     Palpations: Abdomen is soft.     Tenderness: There is no abdominal tenderness.       Comments: Midline abdominal incision healing well.  No erythema, open skin, or drainage.    Musculoskeletal:        General: No swelling.     Right lower leg: No edema.     Left lower leg: No edema.  Lymphadenopathy:     Cervical: No cervical adenopathy.  Skin:    General: Skin is warm and dry.  Neurological:     General: No focal deficit present.     Mental Status: He is alert and oriented to person, place, and time.  Psychiatric:        Mood and Affect: Mood normal.        Behavior: Behavior normal.      LABORATORY DATA:  I have reviewed the labs as listed.  CBC    Component Value Date/Time   WBC 8.9 01/05/2022 1038   RBC 4.47 01/05/2022 1038   HGB 10.9 (L) 01/05/2022 1038   HGB 13.8 04/23/2011 0919   HCT 36.5 (L) 01/05/2022 1038   HCT 26.0 (L) 05/09/2018 0455   PLT 342 01/05/2022 1038   PLT 208 04/23/2011 0919   MCV 81.7 01/05/2022 1038   MCV 84 04/23/2011 0919   MCH 24.4 (L) 01/05/2022 1038   MCHC 29.9 (L) 01/05/2022 1038   RDW 16.0 (H) 01/05/2022 1038   RDW 12.3 04/23/2011 0919   LYMPHSABS 1.5 01/05/2022 1038   LYMPHSABS 1.1 04/23/2011 0919   MONOABS 0.9 01/05/2022 1038   MONOABS 0.9 (H) 04/23/2011 0919   EOSABS 0.3 01/05/2022 1038   EOSABS 0.0 04/23/2011 0919   BASOSABS 0.1 01/05/2022 1038   BASOSABS 0.0 04/23/2011 0919      Latest Ref Rng & Units 01/05/2022   10:42 AM 11/13/2021   12:15 PM 11/09/2021    3:15 AM  CMP  Glucose 70 - 99 mg/dL 82  103  118   BUN 8 - 23 mg/dL '25  29  23   '$ Creatinine 0.61 - 1.24 mg/dL 1.65  1.56  1.72    Sodium 135 - 145 mmol/L 139  138  140   Potassium 3.5 - 5.1 mmol/L 3.9  4.0  3.5   Chloride 98 - 111 mmol/L 105  107  106   CO2 22 - 32 mmol/L '26  25  25   '$ Calcium 8.9 - 10.3 mg/dL 8.9  8.4  8.6     DIAGNOSTIC IMAGING:  I have independently reviewed the relevant imaging and discussed with the patient.  ASSESSMENT & PLAN: 1.  Iron deficiency anemia + anemia of CKD stage IIIb - Combination anemia from CKD, malabsorption and blood loss. - Hemoccult stool was POSITIVE x3 (June 2022) - Most recent colonoscopy (07/19/2021) with villous, fungating, infiltrative and polypoid nonobstructing mass in ascending colon (addressed below); no bleeding was present -- He has never had an EGD - Right hemicolectomy on 08/11/2021, pathology showed high-grade dysplasia negative for invasive carcinoma - Patient is hesitant to try oral iron due to abdominal side effects - He has been on intermittent IV iron infusions.  (Most recent IV Venofer 300 mg x 3 from 09/13/2021 through 09/28/2021) - Most recent labs (01/05/2022): Hgb 10.9/MCV 81.7.  Ferritin 26, iron saturation 6% with elevated TIBC 470.  BMP shows baseline CKD stage IIIb with creatinine 1.65/GFR 45. - Intermittent dark stool and occasional  red streaks - Persistent iron deficiency despite IV repletion is suspicious for ongoing occult blood loss, despite colon mass having been removed (right hemicolectomy on 08/11/2021) - PLAN: Recommend additional IV Venofer 300 mg x 4 doses -- Stool cards x 3 - Repeat labs and RTC in 3 months. - Continue GI follow-up - Patient is aware of alarm signs/symptoms that would prompt immediate medical attention   2.  Locally advanced basal cell carcinoma right nasolabial fold: - Biopsy of the right nasal mucosa on 04/05/2017 with basal cell carcinoma. - Biopsy of the left cheek lesion on 04/02/2017 consistent with basal cell carcinoma. - Resection with skin graft on 06/06/2017.  Margin was close.  Rest of the margins were  negative. - 10 rounds of radiation by Dr. Isidore Moos for recurrent basal cell carcinoma to the right medial canthus completed on 03/20/2020.    3.  Right colon mass (tubular adenomas with high-grade dysplasia) - PET scan in 2019 (obtained due to basal cell skin cancer) demonstrated area of concern with mass in ascending colon - Colonoscopy in 2019 by Dr. Gala Romney demonstrated multiple polyps with tubular adenoma, and patient was referred for surgical excision of colon due to volume of polyps and concern for more extensive disease.  Patient was lost to follow-up with GI and did not follow-up with surgeon at this time. - Reestablished care with GI in May 2023, with colonoscopy he underwent colonoscopy  (07/19/2021) showing villous, fungating, infiltrative and polypoid nonobstructing mass in ascending colon - CT abdomen/pelvis (07/05/2021): Lobular intraluminal mass at: Of hepatic flexure measuring up to 8 cm in length; new pancreatic ductal dilation and atrophy favored to be sequela of chronic pancreatitis but MRI recommended - MRI abdomen (08/06/2021) showed upstream pancreatic atrophy and duct dilation secondary to dominant ductal calculi; no convincing evidence of underlying neoplasm - Due to large size of residual tubular adenoma/right colon mass, patient underwent right hemicolectomy on 08/11/2021.  Surgical pathology showed multiple tubular adenomas, several of which showed focal high-grade dysplasia, but were negative for invasive carcinoma.   - PLAN: Continue follow-up with GI   4.  Vitamin D deficiency - Prior vitamin D (07/31/2021) low at 18.95 - Most recent vitamin D (01/05/2022): Improved and normal at 35.06 - Patient is not sure if he is taking his vitamin D 50,000 units weekly - PLAN: Continue vitamin D ergocalciferol 50,000 units weekly.     PLAN SUMMARY: >> Stool cards x 3 >> Venofer 300 mg x 4 >> Same-day labs (CBC/D, ferritin, iron/TIBC) + office visit in 3 months   All questions were  answered. The patient knows to call the clinic with any problems, questions or concerns.  Medical decision making: Moderate  Time spent on visit: I spent 20 minutes counseling the patient face to face. The total time spent in the appointment was 30 minutes and more than 50% was on counseling.   Harriett Rush, PA-C  01/13/2022 9:38 AM

## 2022-01-13 NOTE — Patient Instructions (Signed)
[facility] [facility] at [facility] Discharge Instructions  You were seen today by Tarri Abernethy PA-C for your iron deficiency anemia.  Your blood and iron levels remain low.  You may be continuing to lose blood from somewhere in your GI tract, and it is important that you continue to follow-up with the GI doctors.  We will schedule you for IV iron infusion x4 doses. I would also like you to check stool cards x 3.  Please bring these to our office once completed so that we can see if you have blood in your bowel movements.  Your blood pressure was high at your visit today.  Please make sure that you do not miss any doses of your blood pressure medications.  If your blood pressure remains high at home, please call your primary care doctor for adjustment of your blood pressure medications.  Seek immediate medical attention if you experience any chest pain, difficulty breathing, headache, dizziness, blurry vision, or strokelike symptoms.  MEDICATIONS: Prescription for vitamin D 50,000 units weekly has been sent to your pharmacy [facility])  FOLLOW-UP APPOINTMENT: Office visit in 3 months, after labs   Thank you for choosing [place] at [facility] to provide your oncology and hematology care.  To afford each patient quality time with our provider, please arrive at least 15 minutes before your scheduled appointment time.   If you have a lab appointment with the [facility] please come in thru the Main Entrance and check in at the main information desk.  You need to re-schedule your appointment should you arrive 10 or more minutes late.  We strive to give you quality time with our providers, and arriving late affects you and other patients whose appointments are after yours.  Also, if you no show three or more times for appointments you may be dismissed from the clinic at the providers discretion.     Again, thank you for choosing [facility].  Our hope is that these requests will decrease the amount of time that you wait before being seen by our physicians.       _____________________________________________________________  Should you have questions after your visit to [facility], please contact our office at [phone] and follow the prompts.  Our office hours are 8:00 a.m. and 4:30 p.m. Monday - Friday.  Please note that voicemails left after 4:00 p.m. may not be returned until the following business day.  We are closed weekends and major holidays.  You do have access to a nurse 24-7, just call the main number to the clinic [phone] and do not press any options, hold on the line and a nurse will answer the phone.    For prescription refill requests, have your pharmacy contact our office and allow 72 hours.    Due to Covid, you will need to wear a mask upon entering the hospital. If you do not have a mask, a mask will be given to you at the Main Entrance upon arrival. For doctor visits, patients may have 1 support person age 80 or older with them. For treatment visits, patients can not have anyone with them due to social distancing guidelines and our immunocompromised population.

## 2022-01-13 NOTE — Telephone Encounter (Signed)
Pt came by office, info given to pt regarding CT scan scheduled for 01/25/22 at 9:00 am, arrive at 7:00 am at [facility].

## 2022-01-25 ENCOUNTER — Ambulatory Visit (HOSPITAL_COMMUNITY)
Admission: RE | Admit: 2022-01-25 | Discharge: 2022-01-25 | Disposition: A | Discharge status: Home / Self Care | Payer: Medicare Other | Source: Ambulatory Visit | Attending: Internal Medicine | Admitting: Internal Medicine

## 2022-01-25 DIAGNOSIS — I723 Aneurysm of iliac artery: Secondary | ICD-10-CM | POA: Diagnosis not present

## 2022-01-25 DIAGNOSIS — K861 Other chronic pancreatitis: Secondary | ICD-10-CM | POA: Diagnosis not present

## 2022-01-25 DIAGNOSIS — K8689 Other specified diseases of pancreas: Secondary | ICD-10-CM | POA: Insufficient documentation

## 2022-01-25 DIAGNOSIS — K8020 Calculus of gallbladder without cholecystitis without obstruction: Secondary | ICD-10-CM | POA: Diagnosis not present

## 2022-01-25 MED ORDER — IOHEXOL 300 MG/ML  SOLN
75.0000 mL | Freq: Once | INTRAMUSCULAR | Status: AC | PRN
  Administered 2022-01-25: 75 mL via INTRAVENOUS

## 2022-01-26 NOTE — Progress Notes (Unsigned)
Cardiology Office Note   Date:  01/27/2022   ID:  Michael [name], DOB 1955-03-02, MRN [id number]  PCP:  [name], MD  Cardiologist:  Dr. Domenic Polite    Chief Complaint  Patient presents with   Cardiomyopathy      History of Present Illness: Michael [name] is a 66 y.o. male who presents for cardiomyopathy.   Cardiac catheterization in September 2023 revealed RCA/RV marginal disease that was felt to be best managed medically-he is on ASA, plavix, lipitor and repatha.  He does have a mixed cardiomyopathy (out of proportion to degree of CAD), LVEF approximately 30% as of September with restrictive diastolic filling pattern.  His last hospital stay was related to chest pain and also history of syncope, medications adjustment with recommendation for outpatient ZIO monitor.  He was then seen in the ER with similar symptoms but also hypoxia.  Chest CTA showed no evidence of pulmonary embolus.   Last office visit 12/14/21 with Dr. Domenic Polite.  Reported no chest pain, no dizziness or syncope.  As it turns out he never wore the ZIO monitor so there are no results to review.  No further syncope.  Weight was up slightly. BP was not well controlled.  Entresto added and Cr was 1.56 and normal K+.  Once on optimal GDMT will repeat echo.     Today he feels well, no SOB or chest pain.  He did fall today -he is not sure why, at first he said he does not remember the fall, then he did remember hitting the table.  He room mate said he hit his head.   No headache no N&V.  He has back pain with fall and bruising.  No pain with deep breath.   He is taking his meds appropriately.    Past Medical History:  Diagnosis Date   Aortic atherosclerosis ([place]) 08/03/2021   CAD (coronary artery disease) 08/03/2021   Cardiac catheterization September 2023 with RCA/RV marginal stenosis managed medically   Cardiomyopathy ([place])    Carotid artery disease ([place])    CKD (chronic kidney disease) stage 3, GFR 30-59 ml/min (HCC)     COPD (chronic obstructive pulmonary disease) (HCC)    Essential hypertension    Head and neck cancer ([place]) 2019   Right facial basal cell carcinoma s/p resection with right partial mastectomy and partal rhinectomy with skin graft (06/13/17)   Iron deficiency anemia    Urinary retention     Past Surgical History:  Procedure Laterality Date   ANKLE CLOSED REDUCTION Right    open reduction   BASAL CELL CARCINOMA EXCISION  2019   at [facility]   BIOPSY  07/19/2021   Procedure: BIOPSY;  Surgeon: Eloise Harman, DO;  [facility];  Service: Endoscopy;;   COLONOSCOPY N/A 05/31/2017   Procedure: COLONOSCOPY;  Surgeon: Daneil Dolin, MD;  [facility];  Service: Endoscopy;  Laterality: N/A;  2:45pm   COLONOSCOPY WITH PROPOFOL N/A 07/19/2021   Procedure: COLONOSCOPY WITH PROPOFOL;  Surgeon: Eloise Harman, DO;  [facility];  Service: Endoscopy;  Laterality: N/A;  3:00pm, moved up to 9:00   CYSTOSCOPY N/A 10/29/2018   Procedure: CYSTOSCOPY, CLOT EVACUATION;  Surgeon: Ceasar Mons, MD;  Location: [facility];  Service: Urology;  Laterality: N/A;   PARTIAL COLECTOMY Right 08/11/2021   Procedure: PARTIAL COLECTOMY, OPEN RIGHT HEMICOLECTOMY;  Surgeon: Virl Cagey, MD;  Location: [facility];  Service: General;  Laterality: Right;   POLYPECTOMY  05/31/2017   Procedure: POLYPECTOMY;  Surgeon: Daneil Dolin, MD;  [facility];  Service: Endoscopy;;   RIGHT/LEFT HEART CATH AND CORONARY ANGIOGRAPHY N/A 10/22/2021   Procedure: RIGHT/LEFT HEART CATH AND CORONARY ANGIOGRAPHY;  Surgeon: Early Osmond, MD;  Location: [facility];  Service: Cardiovascular;  Laterality: N/A;   TONSILLECTOMY     TRANSCAROTID ARTERY REVASCULARIZATION  Right 02/15/2021   Procedure: RIGHT TRANSCAROTID ARTERY REVASCULARIZATION;  Surgeon: [name] Heck, MD;  Location: [facility];  Service: Vascular;  Laterality: Right;   ULTRASOUND GUIDANCE FOR VASCULAR ACCESS Left  02/15/2021   Procedure: ULTRASOUND GUIDANCE FOR VASCULAR ACCESS;  Surgeon: [name] Heck, MD;  Location: [facility];  Service: Vascular;  Laterality: Left;   XI ROBOTIC ASSISTED SIMPLE PROSTATECTOMY N/A 10/29/2018   Procedure: XI ROBOTIC ASSISTED SIMPLE PROSTATECTOMY;  Surgeon: Cleon Gustin, MD;  [facility];  Service: Urology;  Laterality: N/A;     Current Outpatient Medications  Medication Sig Dispense Refill   albuterol (VENTOLIN HFA) 108 (90 Base) MCG/ACT inhaler Inhale 2 puffs into the lungs every 6 (six) hours as needed for wheezing or shortness of breath. 8 g 2   amLODipine (NORVASC) 5 MG tablet Take 5 mg by mouth daily.     aspirin EC 81 MG tablet Take 81 mg by mouth daily.     atorvastatin (LIPITOR) 80 MG tablet Take 1 tablet (80 mg total) by mouth every evening. 90 tablet 3   Budeson-Glycopyrrol-Formoterol (BREZTRI AEROSPHERE) 160-9-4.8 MCG/ACT AERO Inhale 2 puffs into the lungs 2 (two) times daily.     carvedilol (COREG) 12.5 MG tablet Take 1 tablet (12.5 mg total) by mouth 2 (two) times daily with a meal. 60 tablet 0   clopidogrel (PLAVIX) 75 MG tablet Take 1 tablet (75 mg total) by mouth daily. 30 tablet 0   ergocalciferol (VITAMIN D2) 1.25 MG (50000 UT) capsule Take 1 capsule (50,000 Units total) by mouth once a week. 5 capsule 6   Evolocumab (REPATHA SURECLICK) 329 MG/ML SOAJ Inject 1 pen into the skin every 14 (fourteen) days. 2 mL 11   furosemide (LASIX) 40 MG tablet Take 1 tablet (40 mg total) by mouth daily. 30 tablet 0   isosorbide mononitrate (IMDUR) 60 MG 24 hr tablet Take 1 tablet (60 mg total) by mouth every evening. 30 tablet 0   potassium chloride SA (KLOR-CON M) 20 MEQ tablet Take 1 tablet (20 mEq total) by mouth 2 (two) times daily. 60 tablet 0   sacubitril-valsartan (ENTRESTO) 24-26 MG Take 1 tablet by mouth 2 (two) times daily. 60 tablet 6   No current facility-administered medications for this visit.    Allergies:   Patient has no known allergies.     Social History:  The patient  reports that he quit smoking about 6 months ago. His smoking use included cigarettes. He has a 20.00 pack-year smoking history. He has never been exposed to tobacco smoke. He has never used smokeless tobacco. He reports that he does not currently use alcohol. He reports that he does not use drugs.   Family History:  The patient's family history includes Cirrhosis in his mother; Stroke in his father.    ROS:  General:no colds or fevers, no weight changes Skin:no rashes or ulcers HEENT:no blurred vision, no congestion hx of head and neck cancer with Rt facial basal cell resection. And skin graft. CV:see HPI PUL:see HPI GI:no diarrhea constipation or melena, no indigestion GU:no hematuria, no dysuria MS:no joint pain,  no claudication Neuro:+ fall, ?o syncope, no lightheadedness Endo:no diabetes, no thyroid disease  Wt Readings from Last 3 Encounters:  01/27/22 147 lb 9.6 oz (67 kg)  01/12/22 148 lb 11.2 oz (67.4 kg)  01/11/22 152 lb (68.9 kg)     PHYSICAL EXAM: VS:  BP 118/64 (BP Location: Left Arm, Patient Position: Sitting, Cuff Size: Normal)   Pulse 64   Ht 6' (1.829 m)   Wt 147 lb 9.6 oz (67 kg)   BMI 20.02 kg/m  , BMI Body mass index is 20.02 kg/m. General:Pleasant affect, NAD Skin:Warm and dry, brisk capillary refill HEENT:normocephalic, sclera clear, mucus membranes moist Neck:supple, no JVD, no bruits  Heart:S1S2 RRR without murmur, gallup, rub or click Lungs:with rhonchi, but without rales, or wheezes MWU:XLKG, non tender, + BS, do not palpate liver spleen or masses Ext:no lower ext edema, 2+ pedal pulses, 2+ radial pulses Neuro:alert and oriented X 3, MAE, follows commands, + facial symmetry BACK:  Rt sided bruising and abrasion.     EKG:  EKG is NOT ordered today.    Recent Labs: 11/07/2021: ALT 30 11/08/2021: Magnesium 2.0 11/13/2021: B Natriuretic Peptide 2,644.0 01/05/2022: BUN 25; Creatinine, Ser 1.65; Hemoglobin 10.9;  Platelets 342; Potassium 3.9; Sodium 139    Lipid Panel    Component Value Date/Time   CHOL 128 08/19/2021 1030   TRIG 121 08/19/2021 1030   HDL 40 (L) 08/19/2021 1030   CHOLHDL 3.2 08/19/2021 1030   VLDL 24 08/19/2021 1030   LDLCALC 64 08/19/2021 1030       Other studies Reviewed: Additional studies/ records that were reviewed today include: . Rt and Lt cardiac cath   Mid RCA lesion is 70% stenosed.   Acute Mrg lesion is 80% stenosed.   1.  Mild to moderate obstructive coronary artery disease that is outweighed by the patient's degree of LV dysfunction.  There is a significant bifurcation lesion of the right coronary artery and large RV marginal branch.  This is moderately calcified and relatively tortuous.  Given the patient's lack of chest pain, medical therapy should be pursued. 2.  Cardiac output of 6.3 L/min, cardiac index of 3.3 L/min/m, mean RA pressure of 16 mmHg, mean wedge pressure of 21 mmHg, and LVEDP of 21 mmHg.   Recommendation: Goal-directed medical therapy with augmentation of diuretics; consider dual antiplatelet therapy for 1 year for medical treatment of acute coronary syndrome.   Flowsheet Row Most Recent Value  Fick Cardiac Output 6.37 L/min  Fick Cardiac Output Index 3.36 (L/min)/BSA  Aortic Mean Gradient 8.82 mmHg  Aortic Peak Gradient 0 mmHg  Aortic Valve Area >3.50  Aortic Value Area Index 1.85 cm2/BSA  RA A Wave 15 mmHg  RA V Wave 14 mmHg  RA Mean 16 mmHg  RV Systolic Pressure 49 mmHg  RV Diastolic Pressure 6 mmHg  RV EDP 12 mmHg  PA Systolic Pressure 48 mmHg  PA Diastolic Pressure 17 mmHg  PA Mean 29 mmHg  PW A Wave 24 mmHg  PW V Wave 26 mmHg  PW Mean 21 mmHg  AO Systolic Pressure 401 mmHg  AO Diastolic Pressure 56 mmHg  AO Mean 77 mmHg  LV Systolic Pressure 027 mmHg  LV Diastolic Pressure 10 mmHg  LV EDP 21 mmHg  AOp Systolic Pressure 253 mmHg  AOp Diastolic Pressure 65 mmHg  AOp Mean Pressure 87 mmHg  LVp Systolic Pressure 664 mmHg   LVp Diastolic Pressure 14 mmHg  LVp EDP Pressure 26 mmHg  QP/QS 0.96  TPVR  Index 8.97 HRUI  TSVR Index 22.93 HRUI  PVR SVR Ratio 0.14  TPVR/TSVR Ratio 0.39    Echo 10/21/21 IMPRESSIONS     1. LV function is depressed with diffuse hypokinesis in the lateral and  inferiror walls Compared to echo from 2019, LVEF is worse. . Left  ventricular ejection fraction, by estimation, is 30%. The left ventricle  has moderately decreased function. The  left ventricular internal cavity size was severely dilated. There is mild  left ventricular hypertrophy. Left ventricular diastolic parameters are  consistent with Grade III diastolic dysfunction (restrictive). Elevated  left atrial pressure.   2. Right ventricular systolic function is low normal. The right  ventricular size is normal. There is normal pulmonary artery systolic  pressure.   3. Left atrial size was severely dilated.   4. Right atrial size was moderately dilated.   5. A small pericardial effusion is present. The pericardial effusion is  circumferential.   6. Mild mitral valve regurgitation.   7. The aortic valve is tricuspid. Aortic valve regurgitation is not  visualized. Aortic valve sclerosis is present, with no evidence of aortic  valve stenosis.   8. The inferior vena cava dilated.   FINDINGS   Left Ventricle: LV function is depressed with diffuse hypokinesis in the  lateral and inferiror walls Compared to echo from 2019, LVEF is worse.  Left ventricular ejection fraction, by estimation, is 30%. The left  ventricle has moderately decreased  function. The left ventricular internal cavity size was severely dilated.  There is mild left ventricular hypertrophy. Left ventricular diastolic  parameters are consistent with Grade III diastolic dysfunction  (restrictive). Elevated left atrial pressure.   Right Ventricle: The right ventricular size is normal. Right vetricular  wall thickness was not assessed. Right ventricular  systolic function is  low normal. There is normal pulmonary artery systolic pressure. The  tricuspid regurgitant velocity is 1.60  m/s, and with an assumed right atrial pressure of 8 mmHg, the estimated  right ventricular systolic pressure is 93.7 mmHg.   Left Atrium: Left atrial size was severely dilated.   Right Atrium: Right atrial size was moderately dilated.   Pericardium: A small pericardial effusion is present. The pericardial  effusion is circumferential.   Mitral Valve: There is mild thickening of the mitral valve leaflet(s).  Mild mitral annular calcification. Mild mitral valve regurgitation. MV  peak gradient, 6.9 mmHg. The mean mitral valve gradient is 2.0 mmHg.   Tricuspid Valve: The tricuspid valve is normal in structure. Tricuspid  valve regurgitation is mild.   Aortic Valve: The aortic valve is tricuspid. Aortic valve regurgitation is  not visualized. Aortic valve sclerosis is present, with no evidence of  aortic valve stenosis. Aortic valve mean gradient measures 3.0 mmHg.  Aortic valve peak gradient measures 7.2   mmHg. Aortic valve area, by VTI measures 2.65 cm.   Pulmonic Valve: The pulmonic valve was normal in structure. Pulmonic valve  regurgitation is not visualized.   Aorta: The aortic root and ascending aorta are structurally normal, with  no evidence of dilitation.   ASSESSMENT AND PLAN:  1.  Cardiomyopathy-mixed/HFrEF, EF 30% by echo 10/2021-placed on entresto last visit.  Most recent Cr 1.55 will not increase entresto nor add aldactone.  Will add SGLT2  Cr is 1.65 on 01/05/22 will recheck today and add farxiga if Cr stable   follow up in 6 weeks on medication and then schedule echo.  On coreg,  lasix 40 mg daily.  Depending on Cr  may need to reduce.  Continue imdur and K+  2.  Hx of syncope no Recurrence no monitor ordered but with today's fall and he is not sure what happened -though he does remember hitting the table.  + bruising on back.   Will have  him wear 30 day event monitor now with possible 2 episodes.  I have asked him to go to ER for eval.  He may need Xrays of back, if not ER at least Urgent care.  Pt said he would.  In mean time with check CBC with fall  he is on ASA and plavix.   3.  CKD 3b to 3 a will recheck today. Before adding [place].   4. Mild to mod CAD 70 to 80% mid RCA and RV marginal stenoses managed medically at angiography in September.  Denies any chest pain or significant SOB.   5.  HTN improved on entresto.   6.  HLD on statin continue and repatha..    Current medicines are reviewed with the patient today.  The patient Has no concerns regarding medicines.  The following changes have been made:  See above Labs/ tests ordered today include:see above  Disposition:   FU:  see above  Signed, Cecilie Kicks, NP  01/27/2022 2:29 PM    [facility] [place], [place], [place], [place] Phone: [phone]; Fax: [phone]

## 2022-01-27 ENCOUNTER — Encounter: Payer: Self-pay | Admitting: Cardiology

## 2022-01-27 ENCOUNTER — Other Ambulatory Visit: Payer: Self-pay

## 2022-01-27 ENCOUNTER — Ambulatory Visit: Payer: Medicare Other | Attending: Cardiology | Admitting: Cardiology

## 2022-01-27 VITALS — BP 118/64 | HR 64 | Ht 72.0 in | Wt 147.6 lb

## 2022-01-27 DIAGNOSIS — N1832 Chronic kidney disease, stage 3b: Secondary | ICD-10-CM

## 2022-01-27 DIAGNOSIS — I2510 Atherosclerotic heart disease of native coronary artery without angina pectoris: Secondary | ICD-10-CM

## 2022-01-27 DIAGNOSIS — I5020 Unspecified systolic (congestive) heart failure: Secondary | ICD-10-CM

## 2022-01-27 DIAGNOSIS — I10 Essential (primary) hypertension: Secondary | ICD-10-CM

## 2022-01-27 DIAGNOSIS — E782 Mixed hyperlipidemia: Secondary | ICD-10-CM

## 2022-01-27 DIAGNOSIS — Z79899 Other long term (current) drug therapy: Secondary | ICD-10-CM

## 2022-01-27 DIAGNOSIS — R55 Syncope and collapse: Secondary | ICD-10-CM

## 2022-01-27 NOTE — Patient Instructions (Addendum)
Medication Instructions:  Your physician recommends that you continue on your current medications as directed. Please refer to the Current Medication list given to you today.   Labwork: CBC BMP  Testing/Procedures: None  Follow-Up: Follow up with [facility] in 6 weeks.   Any Other Special Instructions Will Be Listed Below (If Applicable).  Your provider recommends that you be seen in the Emergency Department or Urgent [place] for your fall.    If you need a refill on your cardiac medications before your next appointment, please call your pharmacy.

## 2022-01-27 NOTE — Progress Notes (Signed)
30 day [employer] monitor ordered per Cecilie Kicks, NP for syncope and collapse. Voicemail left for patient to call office back regarding monitor

## 2022-01-28 ENCOUNTER — Inpatient Hospital Stay: Payer: Medicare Other

## 2022-01-28 ENCOUNTER — Encounter: Payer: Self-pay | Admitting: *Deleted

## 2022-01-30 ENCOUNTER — Emergency Department (HOSPITAL_COMMUNITY): Payer: Medicare Other

## 2022-01-30 ENCOUNTER — Encounter (HOSPITAL_COMMUNITY): Payer: Self-pay

## 2022-01-30 ENCOUNTER — Other Ambulatory Visit: Payer: Self-pay

## 2022-01-30 ENCOUNTER — Emergency Department (HOSPITAL_COMMUNITY)
Admission: EM | Admit: 2022-01-30 | Discharge: 2022-01-30 | Disposition: A | Discharge status: Home / Self Care | Payer: Medicare Other | Attending: Emergency Medicine | Admitting: Emergency Medicine

## 2022-01-30 DIAGNOSIS — M549 Dorsalgia, unspecified: Secondary | ICD-10-CM | POA: Diagnosis not present

## 2022-01-30 DIAGNOSIS — S32009A Unspecified fracture of unspecified lumbar vertebra, initial encounter for closed fracture: Secondary | ICD-10-CM | POA: Insufficient documentation

## 2022-01-30 DIAGNOSIS — W010XXA Fall on same level from slipping, tripping and stumbling without subsequent striking against object, initial encounter: Secondary | ICD-10-CM | POA: Diagnosis not present

## 2022-01-30 DIAGNOSIS — W19XXXA Unspecified fall, initial encounter: Secondary | ICD-10-CM

## 2022-01-30 DIAGNOSIS — M40204 Unspecified kyphosis, thoracic region: Secondary | ICD-10-CM | POA: Diagnosis not present

## 2022-01-30 DIAGNOSIS — M545 Low back pain, unspecified: Secondary | ICD-10-CM | POA: Diagnosis not present

## 2022-01-30 LAB — CBC WITH DIFFERENTIAL/PLATELET
Abs Immature Granulocytes: 0.04 10*3/uL (ref 0.00–0.07)
Basophils Absolute: 0.1 10*3/uL (ref 0.0–0.1)
Basophils Relative: 1 %
Eosinophils Absolute: 0.5 10*3/uL (ref 0.0–0.5)
Eosinophils Relative: 6 %
HCT: 35.8 % — ABNORMAL LOW (ref 39.0–52.0)
Hemoglobin: 10.9 g/dL — ABNORMAL LOW (ref 13.0–17.0)
Immature Granulocytes: 1 %
Lymphocytes Relative: 20 %
Lymphs Abs: 1.7 10*3/uL (ref 0.7–4.0)
MCH: 24.5 pg — ABNORMAL LOW (ref 26.0–34.0)
MCHC: 30.4 g/dL (ref 30.0–36.0)
MCV: 80.6 fL (ref 80.0–100.0)
Monocytes Absolute: 0.9 10*3/uL (ref 0.1–1.0)
Monocytes Relative: 10 %
Neutro Abs: 5.3 10*3/uL (ref 1.7–7.7)
Neutrophils Relative %: 62 %
Platelets: 314 10*3/uL (ref 150–400)
RBC: 4.44 MIL/uL (ref 4.22–5.81)
RDW: 16.6 % — ABNORMAL HIGH (ref 11.5–15.5)
WBC: 8.5 10*3/uL (ref 4.0–10.5)
nRBC: 0 % (ref 0.0–0.2)

## 2022-01-30 LAB — BASIC METABOLIC PANEL
Anion gap: 9 (ref 5–15)
BUN: 27 mg/dL — ABNORMAL HIGH (ref 8–23)
CO2: 26 mmol/L (ref 22–32)
Calcium: 9.1 mg/dL (ref 8.9–10.3)
Chloride: 107 mmol/L (ref 98–111)
Creatinine, Ser: 1.9 mg/dL — ABNORMAL HIGH (ref 0.61–1.24)
GFR, Estimated: 38 mL/min — ABNORMAL LOW (ref >60)
Glucose, Bld: 116 mg/dL — ABNORMAL HIGH (ref 70–99)
Potassium: 4.1 mmol/L (ref 3.5–5.1)
Sodium: 142 mmol/L (ref 135–145)

## 2022-01-30 MED ORDER — HYDROCODONE-ACETAMINOPHEN 5-325 MG PO TABS: 2.0000 | ORAL_TABLET | Freq: Four times a day (QID) | ORAL | 0 refills | Status: DC | PRN

## 2022-01-30 MED ORDER — HYDROCODONE-ACETAMINOPHEN 5-325 MG PO TABS: 1.0000 | ORAL_TABLET | ORAL | 0 refills | Status: DC | PRN

## 2022-01-30 NOTE — Discharge Instructions (Addendum)
See your Physicain for recheck in 3-4 days 

## 2022-01-30 NOTE — ED Triage Notes (Signed)
Pt reports he tripped and fell backwards into a table 2-3 days ago.  Reports right side middle and lower back pain.

## 2022-01-30 NOTE — ED Provider Notes (Signed)
[facility] EMERGENCY DEPARTMENT Provider Note   CSN: [id number] Arrival date & time: 01/30/22  1231     History  Chief Complaint  Patient presents with   Michael [name] is a 66 y.o. male.  Pt reports he fell and hit his back.  Pt reports continued pain and swelling in his low back. Pt is on plavix  The history is provided by the patient. No language interpreter was used.  Fall This is a new problem. The current episode started more than 2 days ago. The problem occurs constantly. Pertinent negatives include no abdominal pain and no shortness of breath. Nothing aggravates the symptoms. Nothing relieves the symptoms. He has tried nothing for the symptoms. The treatment provided no relief.       Home Medications Prior to Admission medications   Medication Sig Start Date End Date Taking? Authorizing Provider  albuterol (VENTOLIN HFA) 108 (90 Base) MCG/ACT inhaler Inhale 2 puffs into the lungs every 6 (six) hours as needed for wheezing or shortness of breath. 08/14/21   Pappayliou, Barnetta Chapel A, DO  amLODipine (NORVASC) 5 MG tablet Take 5 mg by mouth daily. 10/12/21   [provider]  aspirin EC 81 MG tablet Take 81 mg by mouth daily.    [provider]  atorvastatin (LIPITOR) 80 MG tablet Take 1 tablet (80 mg total) by mouth every evening. 08/10/21   Swinyer, Lanice Schwab, NP  Budeson-Glycopyrrol-Formoterol (BREZTRI AEROSPHERE) 160-9-4.8 MCG/ACT AERO Inhale 2 puffs into the lungs 2 (two) times daily.    [provider]  carvedilol (COREG) 12.5 MG tablet Take 1 tablet (12.5 mg total) by mouth 2 (two) times daily with a meal. 10/26/21   Aline August, MD  clopidogrel (PLAVIX) 75 MG tablet Take 1 tablet (75 mg total) by mouth daily. 10/26/21   Aline August, MD  ergocalciferol (VITAMIN D2) 1.25 MG (50000 UT) capsule Take 1 capsule (50,000 Units total) by mouth once a week. 01/13/22   Harriett Rush, PA-C  Evolocumab (REPATHA SURECLICK) 782 MG/ML SOAJ  Inject 1 pen into the skin every 14 (fourteen) days. 03/09/21   Satira Sark, MD  furosemide (LASIX) 40 MG tablet Take 1 tablet (40 mg total) by mouth daily. 10/26/21 12/15/22  Aline August, MD  isosorbide mononitrate (IMDUR) 60 MG 24 hr tablet Take 1 tablet (60 mg total) by mouth every evening. 11/09/21   Florencia Reasons, MD  potassium chloride SA (KLOR-CON M) 20 MEQ tablet Take 1 tablet (20 mEq total) by mouth 2 (two) times daily. 10/26/21   Aline August, MD  sacubitril-valsartan (ENTRESTO) 24-26 MG Take 1 tablet by mouth 2 (two) times daily. 12/14/21   Satira Sark, MD      Allergies    Patient has no known allergies.    Review of Systems   Review of Systems  Respiratory:  Negative for shortness of breath.   Gastrointestinal:  Negative for abdominal pain and nausea.  Genitourinary:  Negative for difficulty urinating, dysuria and hematuria.  All other systems reviewed and are negative.   Physical Exam Updated Vital Signs BP 128/76 (BP Location: Right Arm)   Pulse 67   Temp 97.7 F (36.5 C) (Oral)   Resp 18   Ht 6' (1.829 m)   Wt 67.1 kg   SpO2 100%   BMI 20.07 kg/m  Physical Exam Vitals and nursing note reviewed.  Constitutional:      Appearance: He is well-developed.  HENT:     Head: Normocephalic.  Cardiovascular:     Rate and Rhythm: Normal rate.  Pulmonary:     Effort: Pulmonary effort is normal.  Abdominal:     General: There is no distension.  Musculoskeletal:        General: Normal range of motion.     Cervical back: Normal range of motion.     Comments: Hematoma right low back,  approx 20 cm area,  tender l2-l3 spine   Skin:    General: Skin is warm.  Neurological:     General: No focal deficit present.     Mental Status: He is alert and oriented to person, place, and time.  Psychiatric:        Mood and Affect: Mood normal.     ED Results / Procedures / Treatments   Labs (all labs ordered are listed, but only abnormal results are  displayed) Labs Reviewed - No data to display  EKG None  Radiology DG Lumbar Spine Complete  Result Date: 01/30/2022 CLINICAL DATA:  Back pain, fall EXAM: LUMBAR SPINE - COMPLETE 4+ VIEW COMPARISON:  10/20/2021 FINDINGS: There is no evidence of lumbar spine fracture. Alignment is normal. Mild disc height loss at L5-S1. The remaining intervertebral disc spaces are maintained. Abdominal aortic atherosclerosis. IMPRESSION: Mild disc height loss at L5-S1. No acute findings. Electronically Signed   By: Davina Poke D.O.   On: 01/30/2022 14:46   DG Thoracic Spine 2 View  Result Date: 01/30/2022 CLINICAL DATA:  Fall, pain EXAM: THORACIC SPINE 2 VIEWS COMPARISON:  11/13/2021 FINDINGS: New mild wedge shaped compression deformity of a lower thoracic vertebral body, likely T10. Exaggerated thoracic kyphosis. The remaining thoracic vertebral body heights are maintained. No traumatic listhesis. Aortic atherosclerosis. IMPRESSION: New mild wedge shaped compression deformity of a lower thoracic vertebral body, likely T10. Correlate with point tenderness. Electronically Signed   By: Davina Poke D.O.   On: 01/30/2022 14:44    Procedures Procedures    Medications Ordered in ED Medications - No data to display  ED Course/ Medical Decision Making/ A&P                           Medical Decision Making Pt reports he fell and hit a table 2 days ago.    Amount and/or Complexity of Data Reviewed Labs: ordered. Decision-making details documented in ED Course.    Details: Labs ordered reviewed and interpreted.  Pt as elevated bun and creatine.  Labs are  Radiology: ordered and independent interpretation performed. Decision-making details documented in ED Course.    Details: T spine shows possible T 10 compression fracture.   Ct t and LS spine shows transverse process fracture of l2 and l3.    Risk Prescription drug management. Risk Details: Area of compression fracture is higher than area of  hematoma.  Ct thoracic and lumbar spine shows L2 and L3 transverse process fracture.  Pt counseled on results.  Pt given rx for pain medication.  Pt advised to follow up with his MD for recheck.             Final Clinical Impression(s) / ED Diagnoses Final diagnoses:  Fall, initial encounter  Closed fracture of transverse process of lumbar vertebra, initial encounter [facility])    Rx / DC Orders ED Discharge Orders          Ordered    HYDROcodone-acetaminophen (NORCO/VICODIN) 5-325 MG tablet  Every 4 hours PRN        01/30/22  1857    HYDROcodone-acetaminophen (NORCO) 5-325 MG tablet  Every 6 hours PRN        01/30/22 1908           An After Visit Summary was printed and given to the patient.    Fransico Meadow, [place] 01/30/22 1934    Fransico Meadow, MD 02/02/22 1710

## 2022-02-02 MED FILL — Hydrocodone-Acetaminophen Tab 5-325 MG: ORAL | Qty: 6 | Status: AC

## 2022-02-04 ENCOUNTER — Inpatient Hospital Stay: Payer: 59 | Attending: Hematology

## 2022-02-04 VITALS — BP 214/69 | HR 70 | Temp 96.7°F | Resp 18

## 2022-02-04 DIAGNOSIS — D500 Iron deficiency anemia secondary to blood loss (chronic): Secondary | ICD-10-CM

## 2022-02-04 DIAGNOSIS — D509 Iron deficiency anemia, unspecified: Secondary | ICD-10-CM | POA: Diagnosis not present

## 2022-02-04 MED ORDER — ACETAMINOPHEN 325 MG PO TABS
650.0000 mg | ORAL_TABLET | Freq: Once | ORAL | Status: AC
  Administered 2022-02-04: 650 mg via ORAL
  Filled 2022-02-04: qty 2

## 2022-02-04 MED ORDER — SODIUM CHLORIDE 0.9 % IV SOLN
300.0000 mg | Freq: Once | INTRAVENOUS | Status: AC
  Administered 2022-02-04: 300 mg via INTRAVENOUS
  Filled 2022-02-04: qty 15

## 2022-02-04 MED ORDER — LORATADINE 10 MG PO TABS
10.0000 mg | ORAL_TABLET | Freq: Once | ORAL | Status: AC
  Administered 2022-02-04: 10 mg via ORAL
  Filled 2022-02-04: qty 1

## 2022-02-04 MED ORDER — SODIUM CHLORIDE 0.9 % IV SOLN: Freq: Once | INTRAVENOUS | Status: AC

## 2022-02-04 NOTE — Progress Notes (Signed)
Okay to proceed with Venofer with elevated BP per Delton Coombes.    Stable during infusion without adverse affects.  BP elevated 214/69, MD notified, okay to discharge patient if he take BP medication at home and follows up with PCP, patient agreeable.  No complaints at this time.  Discharge from clinic ambulatory in stable condition.  Alert and oriented X 3.  Follow up with [facility] as scheduled.

## 2022-02-04 NOTE — Patient Instructions (Signed)
[facility]  Discharge Instructions: Thank you for choosing [facility] to provide your oncology and hematology care.  If you have a lab appointment with the [facility], please come in thru the Main Entrance and check in at the main information desk.  Wear comfortable clothing and clothing appropriate for easy access to any Portacath or PICC line.   We strive to give you quality time with your provider. You may need to reschedule your appointment if you arrive late (15 or more minutes).  Arriving late affects you and other patients whose appointments are after yours.  Also, if you miss three or more appointments without notifying the office, you may be dismissed from the clinic at the provider's discretion.      For prescription refill requests, have your pharmacy contact our office and allow 72 hours for refills to be completed.    Today you received the following chemotherapy and/or immunotherapy agents Venofer      To help prevent nausea and vomiting after your treatment, we encourage you to take your nausea medication as directed.  BELOW ARE SYMPTOMS THAT SHOULD BE REPORTED IMMEDIATELY: *FEVER GREATER THAN 100.4 F (38 C) OR HIGHER *CHILLS OR SWEATING *NAUSEA AND VOMITING THAT IS NOT CONTROLLED WITH YOUR NAUSEA MEDICATION *UNUSUAL SHORTNESS OF BREATH *UNUSUAL BRUISING OR BLEEDING *URINARY PROBLEMS (pain or burning when urinating, or frequent urination) *BOWEL PROBLEMS (unusual diarrhea, constipation, pain near the anus) TENDERNESS IN MOUTH AND THROAT WITH OR WITHOUT PRESENCE OF ULCERS (sore throat, sores in mouth, or a toothache) UNUSUAL RASH, SWELLING OR PAIN  UNUSUAL VAGINAL DISCHARGE OR ITCHING   Items with * indicate a potential emergency and should be followed up as soon as possible or go to the Emergency Department if any problems should occur.  Please show the CHEMOTHERAPY ALERT CARD or IMMUNOTHERAPY ALERT CARD at check-in to the  Emergency Department and triage nurse.  Should you have questions after your visit or need to cancel or reschedule your appointment, please contact [facility] [phone]  and follow the prompts.  Office hours are 8:00 a.m. to 4:30 p.m. Monday - Friday. Please note that voicemails left after 4:00 p.m. may not be returned until the following business day.  We are closed weekends and major holidays. You have access to a nurse at all times for urgent questions. Please call the main number to the clinic [phone] and follow the prompts.  For any non-urgent questions, you may also contact your provider using MyChart. We now offer e-Visits for anyone 58 and older to request care online for non-urgent symptoms. For details visit [email or web address].   Also download the MyChart app! Go to the app store, search "MyChart", open the app, select [facility], and log in with your MyChart username and password.

## 2022-02-07 ENCOUNTER — Telehealth: Payer: Self-pay

## 2022-02-07 NOTE — Telephone Encounter (Signed)
No ans, vm not set up. [phone])

## 2022-02-07 NOTE — Telephone Encounter (Signed)
Pt has presented to the office requesting the results to his CT that was done on 01/25/2022. Results are in his chart. Please advise.

## 2022-02-08 NOTE — Telephone Encounter (Signed)
Lm for pt to return my call.

## 2022-02-10 ENCOUNTER — Other Ambulatory Visit (HOSPITAL_COMMUNITY): Payer: Self-pay

## 2022-02-10 NOTE — Telephone Encounter (Signed)
Pt was made aware and verbalized understanding. Please make pt a f/u with one of the [facility] in 4 mths.

## 2022-02-11 ENCOUNTER — Inpatient Hospital Stay: Payer: 59

## 2022-02-11 VITALS — BP 106/66 | HR 55 | Temp 97.9°F | Resp 17

## 2022-02-11 DIAGNOSIS — D509 Iron deficiency anemia, unspecified: Secondary | ICD-10-CM | POA: Diagnosis not present

## 2022-02-11 DIAGNOSIS — D500 Iron deficiency anemia secondary to blood loss (chronic): Secondary | ICD-10-CM

## 2022-02-11 MED ORDER — SODIUM CHLORIDE 0.9 % IV SOLN
300.0000 mg | Freq: Once | INTRAVENOUS | Status: AC
  Administered 2022-02-11: 300 mg via INTRAVENOUS
  Filled 2022-02-11: qty 300

## 2022-02-11 MED ORDER — SODIUM CHLORIDE 0.9 % IV SOLN: Freq: Once | INTRAVENOUS | Status: AC

## 2022-02-11 MED ORDER — LORATADINE 10 MG PO TABS
10.0000 mg | ORAL_TABLET | Freq: Once | ORAL | Status: AC
  Administered 2022-02-11: 10 mg via ORAL
  Filled 2022-02-11: qty 1

## 2022-02-11 MED ORDER — ACETAMINOPHEN 325 MG PO TABS
650.0000 mg | ORAL_TABLET | Freq: Once | ORAL | Status: AC
  Administered 2022-02-11: 650 mg via ORAL
  Filled 2022-02-11: qty 2

## 2022-02-11 NOTE — Progress Notes (Signed)
Patient presents today for iron infusion.  Patient is in satisfactory condition with no new complaints voiced.  Vital signs are stable.  We will proceed with infusion per provider orders.   Patient tolerated treatment well with no complaints voiced.  Patient refused to wait the recommended 30 minute post iron wait time.  Patient left ambulatory in stable condition.  Vital signs stable at discharge.  Follow up as scheduled.

## 2022-02-11 NOTE — Patient Instructions (Signed)
[facility]  Discharge Instructions: Thank you for choosing [facility] to provide your oncology and hematology care.  If you have a lab appointment with the Cancer Center, please come in thru the Main Entrance and check in at the main information desk.  Wear comfortable clothing and clothing appropriate for easy access to any Portacath or PICC line.   We strive to give you quality time with your provider. You may need to reschedule your appointment if you arrive late (15 or more minutes).  Arriving late affects you and other patients whose appointments are after yours.  Also, if you miss three or more appointments without notifying the office, you may be dismissed from the clinic at the provider's discretion.      For prescription refill requests, have your pharmacy contact our office and allow 72 hours for refills to be completed.    Iron Sucrose Injection What is this medication? IRON SUCROSE (EYE ern SOO krose) treats low levels of iron (iron deficiency anemia) in people with kidney disease. Iron is a mineral that plays an important role in making red blood cells, which carry oxygen from your lungs to the rest of your body. This medicine may be used for other purposes; ask your health care provider or pharmacist if you have questions. COMMON BRAND NAME(S): Venofer What should I tell my care team before I take this medication? They need to know if you have any of these conditions: Anemia not caused by low iron levels Heart disease High levels of iron in the blood Kidney disease Liver disease An unusual or allergic reaction to iron, other medications, foods, dyes, or preservatives Pregnant or trying to get pregnant Breastfeeding How should I use this medication? This medication is for infusion into a vein. It is given in a hospital or clinic setting. Talk to your care team about the use of this medication in children. While this medication may be  prescribed for children as young as 2 years for selected conditions, precautions do apply. Overdosage: If you think you have taken too much of this medicine contact a poison control center or emergency room at once. NOTE: This medicine is only for you. Do not share this medicine with others. What if I miss a dose? Keep appointments for follow-up doses. It is important not to miss your dose. Call your care team if you are unable to keep an appointment. What may interact with this medication? Do not take this medication with any of the following: Deferoxamine Dimercaprol Other iron products This medication may also interact with the following: Chloramphenicol Deferasirox This list may not describe all possible interactions. Give your health care provider a list of all the medicines, herbs, non-prescription drugs, or dietary supplements you use. Also tell them if you smoke, drink alcohol, or use illegal drugs. Some items may interact with your medicine. What should I watch for while using this medication? Visit your care team regularly. Tell your care team if your symptoms do not start to get better or if they get worse. You may need blood work done while you are taking this medication. You may need to follow a special diet. Talk to your care team. Foods that contain iron include: whole grains/cereals, dried fruits, beans, or peas, leafy green vegetables, and organ meats (liver, kidney). What side effects may I notice from receiving this medication? Side effects that you should report to your care team as soon as possible: Allergic reactions--skin rash, itching, hives,   swelling of the face, lips, tongue, or throat Low blood pressure--dizziness, feeling faint or lightheaded, blurry vision Shortness of breath Side effects that usually do not require medical attention (report to your care team if they continue or are bothersome): Flushing Headache Joint pain Muscle pain Nausea Pain, redness, or  irritation at injection site This list may not describe all possible side effects. Call your doctor for medical advice about side effects. You may report side effects to FDA at 1-800-FDA-1088. Where should I keep my medication? This medication is given in a hospital or clinic and will not be stored at home. NOTE: This sheet is a summary. It may not cover all possible information. If you have questions about this medicine, talk to your doctor, pharmacist, or health care provider.  2023 Elsevier/Gold Standard (2020-04-30 00:00:00)    To help prevent nausea and vomiting after your treatment, we encourage you to take your nausea medication as directed.  BELOW ARE SYMPTOMS THAT SHOULD BE REPORTED IMMEDIATELY: *FEVER GREATER THAN 100.4 F (38 C) OR HIGHER *CHILLS OR SWEATING *NAUSEA AND VOMITING THAT IS NOT CONTROLLED WITH YOUR NAUSEA MEDICATION *UNUSUAL SHORTNESS OF BREATH *UNUSUAL BRUISING OR BLEEDING *URINARY PROBLEMS (pain or burning when urinating, or frequent urination) *BOWEL PROBLEMS (unusual diarrhea, constipation, pain near the anus) TENDERNESS IN MOUTH AND THROAT WITH OR WITHOUT PRESENCE OF ULCERS (sore throat, sores in mouth, or a toothache) UNUSUAL RASH, SWELLING OR PAIN  UNUSUAL VAGINAL DISCHARGE OR ITCHING   Items with * indicate a potential emergency and should be followed up as soon as possible or go to the Emergency Department if any problems should occur.  Please show the CHEMOTHERAPY ALERT CARD or IMMUNOTHERAPY ALERT CARD at check-in to the Emergency Department and triage nurse.  Should you have questions after your visit or need to cancel or reschedule your appointment, please contact [facility] [phone]  and follow the prompts.  Office hours are 8:00 a.m. to 4:30 p.m. Monday - Friday. Please note that voicemails left after 4:00 p.m. may not be returned until the following business day.  We are closed weekends and major holidays. You have access to  a nurse at all times for urgent questions. Please call the main number to the clinic [phone] and follow the prompts.  For any non-urgent questions, you may also contact your provider using MyChart. We now offer e-Visits for anyone 18 and older to request care online for non-urgent symptoms. For details visit [email or web address].   Also download the MyChart app! Go to the app store, search "MyChart", open the app, select [facility], and log in with your MyChart username and password.   

## 2022-02-14 ENCOUNTER — Encounter (HOSPITAL_COMMUNITY): Payer: Self-pay | Admitting: Hematology

## 2022-02-18 ENCOUNTER — Inpatient Hospital Stay: Payer: 59

## 2022-02-18 ENCOUNTER — Telehealth: Payer: Self-pay

## 2022-02-18 VITALS — BP 122/74 | HR 62 | Temp 97.5°F | Resp 20

## 2022-02-18 DIAGNOSIS — D500 Iron deficiency anemia secondary to blood loss (chronic): Secondary | ICD-10-CM

## 2022-02-18 DIAGNOSIS — D509 Iron deficiency anemia, unspecified: Secondary | ICD-10-CM | POA: Diagnosis not present

## 2022-02-18 MED ORDER — ACETAMINOPHEN 325 MG PO TABS
650.0000 mg | ORAL_TABLET | Freq: Once | ORAL | Status: AC
  Administered 2022-02-18: 650 mg via ORAL
  Filled 2022-02-18: qty 2

## 2022-02-18 MED ORDER — SODIUM CHLORIDE 0.9 % IV SOLN
300.0000 mg | Freq: Once | INTRAVENOUS | Status: DC
  Filled 2022-02-18: qty 15

## 2022-02-18 MED ORDER — SODIUM CHLORIDE 0.9 % IV SOLN: Freq: Once | INTRAVENOUS | Status: AC

## 2022-02-18 MED ORDER — LORATADINE 10 MG PO TABS
10.0000 mg | ORAL_TABLET | Freq: Once | ORAL | Status: AC
  Administered 2022-02-18: 10 mg via ORAL
  Filled 2022-02-18: qty 1

## 2022-02-18 NOTE — Progress Notes (Signed)
This RN noticed that clamp on secondary medication wasn't open, patient had not received any medication.  Patient did not want to wait today to get his dose of iron.  He agreed to be rescheduled for next week.  MAR  updated to reflect that medication was not given.  Pharmacy notified.

## 2022-02-18 NOTE — Patient Instructions (Signed)
[facility]  Discharge Instructions: Thank you for choosing [facility] to provide your oncology and hematology care.  If you have a lab appointment with the [facility], please come in thru the Main Entrance and check in at the main information desk.  Wear comfortable clothing and clothing appropriate for easy access to any Portacath or PICC line.   We strive to give you quality time with your provider. You may need to reschedule your appointment if you arrive late (15 or more minutes).  Arriving late affects you and other patients whose appointments are after yours.  Also, if you miss three or more appointments without notifying the office, you may be dismissed from the clinic at the provider's discretion.      For prescription refill requests, have your pharmacy contact our office and allow 72 hours for refills to be completed.    Today you received the following   To help prevent nausea and vomiting after your treatment, we encourage you to take your nausea medication as directed.  BELOW ARE SYMPTOMS THAT SHOULD BE REPORTED IMMEDIATELY: *FEVER GREATER THAN 100.4 F (38 C) OR HIGHER *CHILLS OR SWEATING *NAUSEA AND VOMITING THAT IS NOT CONTROLLED WITH YOUR NAUSEA MEDICATION *UNUSUAL SHORTNESS OF BREATH *UNUSUAL BRUISING OR BLEEDING *URINARY PROBLEMS (pain or burning when urinating, or frequent urination) *BOWEL PROBLEMS (unusual diarrhea, constipation, pain near the anus) TENDERNESS IN MOUTH AND THROAT WITH OR WITHOUT PRESENCE OF ULCERS (sore throat, sores in mouth, or a toothache) UNUSUAL RASH, SWELLING OR PAIN  UNUSUAL VAGINAL DISCHARGE OR ITCHING   Items with * indicate a potential emergency and should be followed up as soon as possible or go to the Emergency Department if any problems should occur.  Please show the CHEMOTHERAPY ALERT CARD or IMMUNOTHERAPY ALERT CARD at check-in to the Emergency Department and triage nurse.  Should you have  questions after your visit or need to cancel or reschedule your appointment, please contact [facility] [phone]  and follow the prompts.  Office hours are 8:00 a.m. to 4:30 p.m. Monday - Friday. Please note that voicemails left after 4:00 p.m. may not be returned until the following business day.  We are closed weekends and major holidays. You have access to a nurse at all times for urgent questions. Please call the main number to the clinic [phone] and follow the prompts.  For any non-urgent questions, you may also contact your provider using MyChart. We now offer e-Visits for anyone 6 and older to request care online for non-urgent symptoms. For details visit [email or web address].   Also download the MyChart app! Go to the app store, search "MyChart", open the app, select [facility], and log in with your MyChart username and password.

## 2022-02-18 NOTE — Telephone Encounter (Signed)
Received fax from [facility] that patient has declined monitor- non-compliant/inconvenience

## 2022-02-18 NOTE — Progress Notes (Signed)
Chaplain engaged in a follow-up visit with Michael [name].  Michael [name] shared his life sentiments of "it is what it is" to cope with the many changes happening in his life.  He had adopted this philosophy to maintain himself as things change in his health and life.  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  Chaplain and Michael [name] worked to explore new things that could offer him joy that would not be physically straining on his body and mind.  [place] plans to try some new things and talked about doing more "bow and arrow" courses and competitions possibly.   Chaplain and Michael [name] also spent time talking about his support system.  He has two children that he speaks to about once a month.  He desires to speak to them more, but also acknowledges that he has a good support system in his friends that he lives right beside.  He has a supportive community around him who help him with day to day tasks.  Chaplain asked him if he felt lonely and he was able to speak about them being there for him.    Chaplain offered listening, presence, and support, as well as a charge for Michael [name] to find something that brings him joy and peace.     02/18/22 1100  Spiritual Encounters  Type of Visit Initial  Care provided to: Patient  Spiritual Framework  Presenting Themes Significant life change;Meaning/purpose/sources of inspiration;Impactful experiences and emotions  Community/Connection Family;Friend(s)  Patient Stress Factors Major life changes  Interventions  Spiritual Care Interventions Made Established relationship of care and support;Compassionate presence;Reflective listening;Explored values/beliefs/practices/strengths  Intervention Outcomes  Outcomes Connection to spiritual care;Awareness of support

## 2022-02-23 ENCOUNTER — Inpatient Hospital Stay: Payer: 59

## 2022-02-23 VITALS — BP 150/74 | HR 61 | Temp 97.3°F | Resp 20

## 2022-02-23 DIAGNOSIS — D509 Iron deficiency anemia, unspecified: Secondary | ICD-10-CM | POA: Diagnosis not present

## 2022-02-23 DIAGNOSIS — D500 Iron deficiency anemia secondary to blood loss (chronic): Secondary | ICD-10-CM

## 2022-02-23 MED ORDER — SODIUM CHLORIDE 0.9 % IV SOLN: Freq: Once | INTRAVENOUS | Status: AC

## 2022-02-23 MED ORDER — ACETAMINOPHEN 325 MG PO TABS
650.0000 mg | ORAL_TABLET | Freq: Once | ORAL | Status: AC
  Administered 2022-02-23: 650 mg via ORAL
  Filled 2022-02-23: qty 2

## 2022-02-23 MED ORDER — SODIUM CHLORIDE 0.9 % IV SOLN
300.0000 mg | Freq: Once | INTRAVENOUS | Status: AC
  Administered 2022-02-23: 300 mg via INTRAVENOUS
  Filled 2022-02-23: qty 300

## 2022-02-23 MED ORDER — LORATADINE 10 MG PO TABS
10.0000 mg | ORAL_TABLET | Freq: Once | ORAL | Status: AC
  Administered 2022-02-23: 10 mg via ORAL
  Filled 2022-02-23: qty 1

## 2022-02-23 NOTE — Patient Instructions (Signed)
[facility]  Discharge Instructions: Thank you for choosing [facility] to provide your oncology and hematology care.  If you have a lab appointment with the Cancer Center, please come in thru the Main Entrance and check in at the main information desk.  Wear comfortable clothing and clothing appropriate for easy access to any Portacath or PICC line.   We strive to give you quality time with your provider. You may need to reschedule your appointment if you arrive late (15 or more minutes).  Arriving late affects you and other patients whose appointments are after yours.  Also, if you miss three or more appointments without notifying the office, you may be dismissed from the clinic at the provider's discretion.      For prescription refill requests, have your pharmacy contact our office and allow 72 hours for refills to be completed.    Today you received Venofer IV iron infusion.     BELOW ARE SYMPTOMS THAT SHOULD BE REPORTED IMMEDIATELY: *FEVER GREATER THAN 100.4 F (38 C) OR HIGHER *CHILLS OR SWEATING *NAUSEA AND VOMITING THAT IS NOT CONTROLLED WITH YOUR NAUSEA MEDICATION *UNUSUAL SHORTNESS OF BREATH *UNUSUAL BRUISING OR BLEEDING *URINARY PROBLEMS (pain or burning when urinating, or frequent urination) *BOWEL PROBLEMS (unusual diarrhea, constipation, pain near the anus) TENDERNESS IN MOUTH AND THROAT WITH OR WITHOUT PRESENCE OF ULCERS (sore throat, sores in mouth, or a toothache) UNUSUAL RASH, SWELLING OR PAIN  UNUSUAL VAGINAL DISCHARGE OR ITCHING   Items with * indicate a potential emergency and should be followed up as soon as possible or go to the Emergency Department if any problems should occur.  Please show the CHEMOTHERAPY ALERT CARD or IMMUNOTHERAPY ALERT CARD at check-in to the Emergency Department and triage nurse.  Should you have questions after your visit or need to cancel or reschedule your appointment, please contact [facility] [phone]  and follow the prompts.  Office hours are 8:00 a.m. to 4:30 p.m. Monday - Friday. Please note that voicemails left after 4:00 p.m. may not be returned until the following business day.  We are closed weekends and major holidays. You have access to a nurse at all times for urgent questions. Please call the main number to the clinic [phone] and follow the prompts.  For any non-urgent questions, you may also contact your provider using MyChart. We now offer e-Visits for anyone 18 and older to request care online for non-urgent symptoms. For details visit [email or web address].   Also download the MyChart app! Go to the app store, search "MyChart", open the app, select [facility], and log in with your MyChart username and password.   

## 2022-02-23 NOTE — Progress Notes (Signed)
Pt presents today for Venofer IV iron infusion per provider's order. Vital signs stable and pt voiced no new complaints at this time.  Peripheral IV started with good blood return pre and post infusion.  Venofer 300 mg  given today per MD orders. Tolerated infusion without adverse affects. Vital signs stable. No complaints at this time. Discharged from clinic ambulatory in stable condition. Alert and oriented x 3. F/U with [facility] as scheduled.   

## 2022-02-24 ENCOUNTER — Emergency Department (HOSPITAL_COMMUNITY)
Admission: EM | Admit: 2022-02-24 | Discharge: 2022-02-24 | Disposition: A | Discharge status: Home / Self Care | Payer: 59 | Attending: Emergency Medicine | Admitting: Emergency Medicine

## 2022-02-24 ENCOUNTER — Encounter (HOSPITAL_COMMUNITY): Payer: Self-pay

## 2022-02-24 ENCOUNTER — Emergency Department (HOSPITAL_COMMUNITY): Payer: 59

## 2022-02-24 ENCOUNTER — Other Ambulatory Visit: Payer: Self-pay

## 2022-02-24 DIAGNOSIS — Z7982 Long term (current) use of aspirin: Secondary | ICD-10-CM | POA: Diagnosis not present

## 2022-02-24 DIAGNOSIS — I509 Heart failure, unspecified: Secondary | ICD-10-CM | POA: Insufficient documentation

## 2022-02-24 DIAGNOSIS — Y92009 Unspecified place in unspecified non-institutional (private) residence as the place of occurrence of the external cause: Secondary | ICD-10-CM | POA: Insufficient documentation

## 2022-02-24 DIAGNOSIS — W01198A Fall on same level from slipping, tripping and stumbling with subsequent striking against other object, initial encounter: Secondary | ICD-10-CM | POA: Diagnosis not present

## 2022-02-24 DIAGNOSIS — J439 Emphysema, unspecified: Secondary | ICD-10-CM | POA: Diagnosis not present

## 2022-02-24 DIAGNOSIS — Z7951 Long term (current) use of inhaled steroids: Secondary | ICD-10-CM | POA: Insufficient documentation

## 2022-02-24 DIAGNOSIS — J449 Chronic obstructive pulmonary disease, unspecified: Secondary | ICD-10-CM | POA: Insufficient documentation

## 2022-02-24 DIAGNOSIS — S20212A Contusion of left front wall of thorax, initial encounter: Secondary | ICD-10-CM | POA: Diagnosis not present

## 2022-02-24 DIAGNOSIS — J40 Bronchitis, not specified as acute or chronic: Secondary | ICD-10-CM | POA: Diagnosis not present

## 2022-02-24 DIAGNOSIS — I2510 Atherosclerotic heart disease of native coronary artery without angina pectoris: Secondary | ICD-10-CM | POA: Diagnosis not present

## 2022-02-24 DIAGNOSIS — S2232XA Fracture of one rib, left side, initial encounter for closed fracture: Secondary | ICD-10-CM

## 2022-02-24 DIAGNOSIS — S299XXA Unspecified injury of thorax, initial encounter: Secondary | ICD-10-CM | POA: Diagnosis present

## 2022-02-24 DIAGNOSIS — S2242XA Multiple fractures of ribs, left side, initial encounter for closed fracture: Secondary | ICD-10-CM | POA: Diagnosis not present

## 2022-02-24 DIAGNOSIS — N189 Chronic kidney disease, unspecified: Secondary | ICD-10-CM | POA: Diagnosis not present

## 2022-02-24 DIAGNOSIS — I700 Atherosclerosis of aorta: Secondary | ICD-10-CM | POA: Diagnosis not present

## 2022-02-24 MED ORDER — HYDROCODONE-ACETAMINOPHEN 5-325 MG PO TABS
1.0000 | ORAL_TABLET | Freq: Once | ORAL | Status: AC
  Administered 2022-02-24: 1 via ORAL
  Filled 2022-02-24: qty 1

## 2022-02-24 NOTE — Discharge Instructions (Addendum)
Note the workup today was overall consistent with left-sided rib fracture.  As discussed, take at Tylenol/Motrin for pain as needed.  Use incentive spirometry to make sure you continue to take deep breaths.  Recommend follow-up with primary care for reassessment of symptoms.  Please not hesitate to return to emergency department the worrisome signs and symptoms we discussed become apparent.

## 2022-02-24 NOTE — ED Provider Notes (Signed)
[facility] Provider Note   CSN: [id number] Arrival date & time: 02/24/22  1106     History  Chief Complaint  Patient presents with   Rib Injury    Michael [name] is a 67 y.o. male.  HPI   67 year old male presents emergency department after a fall.  Patient reports tripping on an object at his home approximately 2 days ago falling landing on his couch striking his left ribs.  Reports persistent pain since onset.  Denies trauma to head, loss of consciousness, back pain, shortness of breath, abdominal pain, nausea, vomiting.  Patient states he has been able to walk since the incident at baseline.  Past medical history significant for CAD, COPD, CKD, heart failure  Home Medications Prior to Admission medications   Medication Sig Start Date End Date Taking? Authorizing Provider  albuterol (VENTOLIN HFA) 108 (90 Base) MCG/ACT inhaler Inhale 2 puffs into the lungs every 6 (six) hours as needed for wheezing or shortness of breath. 08/14/21   Pappayliou, Barnetta Chapel A, DO  amLODipine (NORVASC) 5 MG tablet Take 5 mg by mouth daily. 10/12/21   [provider]  aspirin EC 81 MG tablet Take 81 mg by mouth daily.    [provider]  atorvastatin (LIPITOR) 80 MG tablet Take 1 tablet (80 mg total) by mouth every evening. 08/10/21   Swinyer, Lanice Schwab, NP  Budeson-Glycopyrrol-Formoterol (BREZTRI AEROSPHERE) 160-9-4.8 MCG/ACT AERO Inhale 2 puffs into the lungs 2 (two) times daily.    [provider]  carvedilol (COREG) 12.5 MG tablet Take 1 tablet (12.5 mg total) by mouth 2 (two) times daily with a meal. 10/26/21   Aline August, MD  clopidogrel (PLAVIX) 75 MG tablet Take 1 tablet (75 mg total) by mouth daily. 10/26/21   Aline August, MD  ergocalciferol (VITAMIN D2) 1.25 MG (50000 UT) capsule Take 1 capsule (50,000 Units total) by mouth once a week. 01/13/22   Harriett Rush, PA-C  Evolocumab (REPATHA SURECLICK) 588 MG/ML  SOAJ Inject 1 pen into the skin every 14 (fourteen) days. 03/09/21   Satira Sark, MD  furosemide (LASIX) 40 MG tablet Take 1 tablet (40 mg total) by mouth daily. 10/26/21 12/15/22  Aline August, MD  HYDROcodone-acetaminophen (NORCO) 5-325 MG tablet Take 2 tablets by mouth every 6 (six) hours as needed for moderate pain. 01/30/22   Fransico Meadow, PA-C  HYDROcodone-acetaminophen (NORCO/VICODIN) 5-325 MG tablet Take 1 tablet by mouth every 4 (four) hours as needed for moderate pain. 01/30/22 01/30/23  Fransico Meadow, PA-C  isosorbide mononitrate (IMDUR) 60 MG 24 hr tablet Take 1 tablet (60 mg total) by mouth every evening. 11/09/21   Florencia Reasons, MD  potassium chloride SA (KLOR-CON M) 20 MEQ tablet Take 1 tablet (20 mEq total) by mouth 2 (two) times daily. 10/26/21   Aline August, MD  sacubitril-valsartan (ENTRESTO) 24-26 MG Take 1 tablet by mouth 2 (two) times daily. 12/14/21   Satira Sark, MD      Allergies    Patient has no known allergies.    Review of Systems   Review of Systems  All other systems reviewed and are negative.   Physical Exam Updated Vital Signs BP (!) 148/87 (BP Location: Right Arm)   Pulse 66   Temp 97.7 F (36.5 C) (Oral)   Resp 16   Ht 6' (1.829 m)   Wt 67.1 kg   SpO2 98%   BMI 20.07 kg/m  Physical Exam Vitals and  nursing note reviewed.  Constitutional:      General: He is not in acute distress.    Appearance: He is well-developed.  HENT:     Head: Normocephalic and atraumatic.  Eyes:     Conjunctiva/sclera: Conjunctivae normal.  Cardiovascular:     Rate and Rhythm: Normal rate and regular rhythm.  Pulmonary:     Effort: Pulmonary effort is normal. No respiratory distress.     Breath sounds: Normal breath sounds.     Comments: Left-sided rib tenderness. Abdominal:     Palpations: Abdomen is soft.     Tenderness: There is no abdominal tenderness. There is no right CVA tenderness, left CVA tenderness or guarding.  Musculoskeletal:         General: No swelling.     Cervical back: Neck supple.     Comments: No midline tenderness of cervical, thoracic, lumbar spine with no obvious step-off or deformity noted.  Patient moves all 4 extremities without difficulty.  No tender palpation of bilateral upper and lower extremities.  Skin:    General: Skin is warm and dry.     Capillary Refill: Capillary refill takes less than 2 seconds.  Neurological:     Mental Status: He is alert.  Psychiatric:        Mood and Affect: Mood normal.     ED Results / Procedures / Treatments   Labs (all labs ordered are listed, but only abnormal results are displayed) Labs Reviewed - No data to display  EKG None  Radiology DG Ribs Unilateral W/Chest Left  Result Date: 02/24/2022 CLINICAL DATA:  Tripped and struck LEFT side of ribs on a couch 2 days ago, persistent pain EXAM: LEFT RIBS AND CHEST - 3+ VIEW COMPARISON:  11/13/2021 FINDINGS: Normal heart size, mediastinal contours, and pulmonary vascularity. Atherosclerotic calcification aorta. LEFT nipple shadow. Emphysematous and bronchitic changes. No pulmonary infiltrate, pleural effusion, or pneumothorax. Bones demineralized. Nondisplaced fracture of a lower anterior LEFT rib identified, approximately seventh. IMPRESSION: Nondisplaced fracture of the anterior aspect of a lower LEFT rib approximately LEFT seventh rib. Aortic Atherosclerosis (ICD10-I70.0) and Emphysema (ICD10-J43.9). Electronically Signed   By: Lavonia Dana M.D.   On: 02/24/2022 12:37    Procedures Procedures    Medications Ordered in ED Medications  HYDROcodone-acetaminophen (NORCO/VICODIN) 5-325 MG per tablet 1 tablet (1 tablet Oral Given 02/24/22 1207)    ED Course/ Medical Decision Making/ A&P                             Medical Decision Making Amount and/or Complexity of Data Reviewed Radiology: ordered.  Risk Prescription drug management.   This patient presents to the ED for concern of fall, this involves an  extensive number of treatment options, and is a complaint that carries with it a high risk of complications and morbidity.  The differential diagnosis includes pneumothorax, fracture, strain/sprain, dislocation   Co morbidities that complicate the patient evaluation  See HPI   Additional history obtained:  Additional history obtained from EMR External records from outside source obtained and reviewed including hospital records   Lab Tests:  N/a   Imaging Studies ordered:  I ordered imaging studies including chest x-ray with left rib I independently visualized and interpreted imaging which showed fracture of left anterior seventh rib. I agree with the radiologist interpretation   Cardiac Monitoring: / EKG:  The patient was maintained on a cardiac monitor.  I personally viewed and interpreted the cardiac  monitored which showed an underlying rhythm of: Sinus rhythm   Consultations Obtained:  N/a   Problem List / ED Course / Critical interventions / Medication management  Rib fracture I ordered medication including norco    Reevaluation of the patient after these medicines showed that the patient improved I have reviewed the patients home medicines and have made adjustments as needed   Social Determinants of Health:  Former cigarette use.  Denies illicit drug use.   Test / Admission - Considered:  Rib fracture Vitals signs significant for hypertension with blood pressure of 148/87.  Recommend follow-up with primary care regarding elevation blood pressure.. Otherwise within normal range and stable throughout visit. Imaging studies significant for: See above Patient with evidence of solitary left rib fracture without evidence of underlying rib pathology.  Fall described as mechanical in nature.  No other stable exam findings warranting further imaging at this time.  Patient responded well to pain medication while emergency department.  Will give incentive spirometer to  use at home.  Recommend Tylenol/Motrin as needed for pain.  Recommend follow-up with primary care for reassessment of symptoms. Worrisome signs and symptoms were discussed with the patient, and the patient acknowledged understanding to return to the ED if noticed. Patient was stable upon discharge.          Final Clinical Impression(s) / ED Diagnoses Final diagnoses:  Closed fracture of one rib of left side, initial encounter    Rx / DC Orders ED Discharge Orders     None         Wilnette Kales, [place] 02/24/22 1416    Noemi Chapel, MD 02/25/22 [phone]

## 2022-02-24 NOTE — ED Triage Notes (Signed)
Pt tripped and hit his left side ribs on the couch 2 days ago and ribs are still painful.

## 2022-02-27 DIAGNOSIS — S2239XA Fracture of one rib, unspecified side, initial encounter for closed fracture: Secondary | ICD-10-CM | POA: Insufficient documentation

## 2022-02-28 DIAGNOSIS — W010XXD Fall on same level from slipping, tripping and stumbling without subsequent striking against object, subsequent encounter: Secondary | ICD-10-CM | POA: Diagnosis not present

## 2022-02-28 DIAGNOSIS — S2232XD Fracture of one rib, left side, subsequent encounter for fracture with routine healing: Secondary | ICD-10-CM | POA: Diagnosis not present

## 2022-03-17 ENCOUNTER — Ambulatory Visit: Payer: 59 | Attending: Medical | Admitting: Medical

## 2022-03-17 NOTE — Progress Notes (Deleted)
Cardiology Office Note:    Date:  03/17/2022   ID:  Michael [name], DOB 09/21/1955, MRN [id number]  PCP:  [name], MD  [facility] Cardiologist:  Rozann Lesches, MD  [facility] Electrophysiologist:  None   Referring MD: [name], MD   Chief Complaint: 6 week follow-up  History of Present Illness:    Michael [name] is a 67 y.o. male with a hx of cardiomyopathy, carotid artery disease followed by vascular surgery post TCAR for asymptomatic high-grade right internal carotid artery stenosis, COPD, CKD stage 3b who presents for follow-up.   Cardiac cath in 10/2021 showed RCA/RV marginal disease that was felt to be best managed medically- he is on ASA, Plavix, Lipitor and Repatha. He does have mixed CM out of proportion to degree of CAD. LVEF approximately 30% as of September without restrictive diastolic filling pattern.   Patient reported syncope and was supposed to wear a heart monitor, but never did.   Last seen 01/27/22 and was overall feeling OK. A 30 day event monitor was ordered for syncope.   Today,   Past Medical History:  Diagnosis Date   Aortic atherosclerosis ([place]) 08/03/2021   CAD (coronary artery disease) 08/03/2021   Cardiac catheterization September 2023 with RCA/RV marginal stenosis managed medically   Cardiomyopathy ([place])    Carotid artery disease ([place])    CKD (chronic kidney disease) stage 3, GFR 30-59 ml/min (HCC)    COPD (chronic obstructive pulmonary disease) (HCC)    Essential hypertension    Head and neck cancer ([place]) 2019   Right facial basal cell carcinoma s/p resection with right partial mastectomy and partal rhinectomy with skin graft (06/13/17)   Iron deficiency anemia    Urinary retention     Past Surgical History:  Procedure Laterality Date   ANKLE CLOSED REDUCTION Right    open reduction   BASAL CELL CARCINOMA EXCISION  2019   at [facility]   BIOPSY  07/19/2021   Procedure: BIOPSY;  Surgeon: Eloise Harman, DO;  [facility];  Service: Endoscopy;;   COLONOSCOPY N/A 05/31/2017   Procedure: COLONOSCOPY;  Surgeon: Daneil Dolin, MD;  [facility];  Service: Endoscopy;  Laterality: N/A;  2:45pm   COLONOSCOPY WITH PROPOFOL N/A 07/19/2021   Procedure: COLONOSCOPY WITH PROPOFOL;  Surgeon: Eloise Harman, DO;  [facility];  Service: Endoscopy;  Laterality: N/A;  3:00pm, moved up to 9:00   CYSTOSCOPY N/A 10/29/2018   Procedure: CYSTOSCOPY, CLOT EVACUATION;  Surgeon: Ceasar Mons, MD;  Location: [facility];  Service: Urology;  Laterality: N/A;   PARTIAL COLECTOMY Right 08/11/2021   Procedure: PARTIAL COLECTOMY, OPEN RIGHT HEMICOLECTOMY;  Surgeon: Virl Cagey, MD;  Location: [facility];  Service: General;  Laterality: Right;   POLYPECTOMY  05/31/2017   Procedure: POLYPECTOMY;  Surgeon: Daneil Dolin, MD;  [facility];  Service: Endoscopy;;   RIGHT/LEFT HEART CATH AND CORONARY ANGIOGRAPHY N/A 10/22/2021   Procedure: RIGHT/LEFT HEART CATH AND CORONARY ANGIOGRAPHY;  Surgeon: Early Osmond, MD;  Location: [facility];  Service: Cardiovascular;  Laterality: N/A;   TONSILLECTOMY     TRANSCAROTID ARTERY REVASCULARIZATION  Right 02/15/2021   Procedure: RIGHT TRANSCAROTID ARTERY REVASCULARIZATION;  Surgeon: [name] Heck, MD;  Location: [facility];  Service: Vascular;  Laterality: Right;   ULTRASOUND GUIDANCE FOR VASCULAR ACCESS Left 02/15/2021   Procedure: ULTRASOUND GUIDANCE FOR VASCULAR ACCESS;  Surgeon: [name] Heck, MD;  Location: [facility];  Service: Vascular;  Laterality: Left;   XI ROBOTIC ASSISTED SIMPLE PROSTATECTOMY N/A 10/29/2018   Procedure: XI ROBOTIC ASSISTED SIMPLE PROSTATECTOMY;  Surgeon: Cleon Gustin, MD;  Location: [facility];  Service: Urology;  Laterality: N/A;    Current Medications: No outpatient medications have been marked as taking for the 03/17/22 encounter (Appointment) with Kathlen Mody, Ammanda Dobbins H, PA-C.     Allergies:   Patient has no known  allergies.   Social History   Socioeconomic History   Marital status: Single    Spouse name: Not on file   Number of children: Not on file   Years of education: Not on file   Highest education level: Not on file  Occupational History   Not on file  Tobacco Use   Smoking status: Former    Packs/day: 0.50    Years: 40.00    Total pack years: 20.00    Types: Cigarettes    Quit date: 07/02/2021    Years since quitting: 0.7    Passive exposure: Never   Smokeless tobacco: Never   Tobacco comments:    Quit on 06/09/21  Vaping Use   Vaping Use: Never used  Substance and Sexual Activity   Alcohol use: Not Currently   Drug use: Never   Sexual activity: Not Currently  Other Topics Concern   Not on file  Social History Narrative   Not on file   Social Determinants of Health   Financial Resource Strain: Low Risk  (12/11/2019)   Overall Financial Resource Strain (CARDIA)    Difficulty of Paying Living Expenses: Not hard at all  Food Insecurity: No Food Insecurity (10/20/2021)   Hunger Vital Sign    Worried About Running Out of Food in the Last Year: Never true    [place] in the Last Year: Never true  Transportation Needs: No Transportation Needs (10/20/2021)   PRAPARE - Hydrologist (Medical): No    Lack of Transportation (Non-Medical): No  Physical Activity: Inactive (12/11/2019)   Exercise Vital Sign    Days of Exercise per Week: 0 days    Minutes of Exercise per Session: 0 min  Stress: No Stress Concern Present (12/11/2019)   Bartholomew    Feeling of Stress : Not at all  Social Connections: Socially Isolated (12/11/2019)   Social Connection and Isolation Panel [NHANES]    Frequency of Communication with Friends and Family: Never    Frequency of Social Gatherings with Friends and Family: Never    Attends Religious Services: Never    Marine scientist or Organizations:  No    Attends Music therapist: Never    Marital Status: Divorced     Family History: The patient's ***family history includes Cirrhosis in his mother; Stroke in his father. There is no history of Colon cancer.  ROS:   Please see the history of present illness.    *** All other systems reviewed and are negative.  EKGs/Labs/Other Studies Reviewed:    The following studies were reviewed today: ***  EKG:  EKG is *** ordered today.  The ekg ordered today demonstrates ***  Recent Labs: 11/07/2021: ALT 30 11/08/2021: Magnesium 2.0 11/13/2021: B Natriuretic Peptide 2,644.0 01/30/2022: BUN 27; Creatinine, Ser 1.90; Hemoglobin 10.9; Platelets 314; Potassium 4.1; Sodium 142  Recent Lipid Panel    Component Value Date/Time   CHOL 128 08/19/2021 1030   TRIG 121 08/19/2021 1030   HDL  40 (L) 08/19/2021 1030   CHOLHDL 3.2 08/19/2021 1030   VLDL 24 08/19/2021 1030   LDLCALC 64 08/19/2021 1030     Risk Assessment/Calculations:   {Does this patient have ATRIAL FIBRILLATION?:[id number]}   Physical Exam:    VS:  There were no vitals taken for this visit.    Wt Readings from Last 3 Encounters:  02/24/22 148 lb (67.1 kg)  01/30/22 148 lb (67.1 kg)  01/27/22 147 lb 9.6 oz (67 kg)     GEN: *** Well nourished, well developed in no acute distress HEENT: Normal NECK: No JVD; No carotid bruits LYMPHATICS: No lymphadenopathy CARDIAC: ***RRR, no murmurs, rubs, gallops RESPIRATORY:  Clear to auscultation without rales, wheezing or rhonchi  ABDOMEN: Soft, non-tender, non-distended MUSCULOSKELETAL:  No edema; No deformity  SKIN: Warm and dry NEUROLOGIC:  Alert and oriented x 3 PSYCHIATRIC:  Normal affect   ASSESSMENT:    No diagnosis found. PLAN:    In order of problems listed above:  CM mixed HFrEF  H/o syncope  CKD stage 3  Mild to mod CAD  HTN  HLD    Disposition: Follow up {follow up:15908} with ***   Shared Decision Making/Informed Consent   {Are  you ordering a CV Procedure (e.g. stress test, cath, DCCV, TEE, etc)?   Press F2        :[id number]    Signed, Marquia Costello Arlyss Repress  03/17/2022 10:26 AM    [facility]

## 2022-03-22 ENCOUNTER — Encounter: Payer: Self-pay | Admitting: Medical

## 2022-03-24 DIAGNOSIS — R55 Syncope and collapse: Secondary | ICD-10-CM | POA: Diagnosis not present

## 2022-03-24 DIAGNOSIS — I5043 Acute on chronic combined systolic (congestive) and diastolic (congestive) heart failure: Secondary | ICD-10-CM | POA: Diagnosis not present

## 2022-03-24 DIAGNOSIS — I110 Hypertensive heart disease with heart failure: Secondary | ICD-10-CM | POA: Diagnosis not present

## 2022-03-24 DIAGNOSIS — U071 COVID-19: Secondary | ICD-10-CM | POA: Diagnosis not present

## 2022-03-24 DIAGNOSIS — R059 Cough, unspecified: Secondary | ICD-10-CM | POA: Diagnosis not present

## 2022-03-24 DIAGNOSIS — I440 Atrioventricular block, first degree: Secondary | ICD-10-CM | POA: Diagnosis not present

## 2022-03-24 DIAGNOSIS — I10 Essential (primary) hypertension: Secondary | ICD-10-CM | POA: Diagnosis not present

## 2022-04-12 DIAGNOSIS — I5043 Acute on chronic combined systolic (congestive) and diastolic (congestive) heart failure: Secondary | ICD-10-CM | POA: Diagnosis not present

## 2022-04-12 DIAGNOSIS — I6529 Occlusion and stenosis of unspecified carotid artery: Secondary | ICD-10-CM | POA: Diagnosis not present

## 2022-04-12 DIAGNOSIS — I10 Essential (primary) hypertension: Secondary | ICD-10-CM | POA: Diagnosis not present

## 2022-04-12 DIAGNOSIS — N1832 Chronic kidney disease, stage 3b: Secondary | ICD-10-CM | POA: Diagnosis not present

## 2022-04-12 DIAGNOSIS — I214 Non-ST elevation (NSTEMI) myocardial infarction: Secondary | ICD-10-CM | POA: Diagnosis not present

## 2022-04-12 DIAGNOSIS — I440 Atrioventricular block, first degree: Secondary | ICD-10-CM | POA: Diagnosis not present

## 2022-04-12 DIAGNOSIS — R55 Syncope and collapse: Secondary | ICD-10-CM | POA: Diagnosis not present

## 2022-04-12 DIAGNOSIS — I700 Atherosclerosis of aorta: Secondary | ICD-10-CM | POA: Diagnosis not present

## 2022-04-12 DIAGNOSIS — I130 Hypertensive heart and chronic kidney disease with heart failure and stage 1 through stage 4 chronic kidney disease, or unspecified chronic kidney disease: Secondary | ICD-10-CM | POA: Diagnosis not present

## 2022-04-12 DIAGNOSIS — E785 Hyperlipidemia, unspecified: Secondary | ICD-10-CM | POA: Diagnosis not present

## 2022-04-12 DIAGNOSIS — J449 Chronic obstructive pulmonary disease, unspecified: Secondary | ICD-10-CM | POA: Diagnosis not present

## 2022-04-12 DIAGNOSIS — Z0001 Encounter for general adult medical examination with abnormal findings: Secondary | ICD-10-CM | POA: Diagnosis not present

## 2022-04-13 ENCOUNTER — Encounter (HOSPITAL_COMMUNITY): Payer: Self-pay | Admitting: *Deleted

## 2022-04-13 ENCOUNTER — Emergency Department (HOSPITAL_COMMUNITY): Payer: 59

## 2022-04-13 ENCOUNTER — Other Ambulatory Visit: Payer: Self-pay

## 2022-04-13 ENCOUNTER — Emergency Department (HOSPITAL_COMMUNITY)
Admission: EM | Admit: 2022-04-13 | Discharge: 2022-04-13 | Disposition: A | Discharge status: Home / Self Care | Payer: 59 | Attending: Emergency Medicine | Admitting: Emergency Medicine

## 2022-04-13 DIAGNOSIS — Z7902 Long term (current) use of antithrombotics/antiplatelets: Secondary | ICD-10-CM | POA: Diagnosis not present

## 2022-04-13 DIAGNOSIS — G9389 Other specified disorders of brain: Secondary | ICD-10-CM | POA: Diagnosis not present

## 2022-04-13 DIAGNOSIS — N189 Chronic kidney disease, unspecified: Secondary | ICD-10-CM | POA: Diagnosis not present

## 2022-04-13 DIAGNOSIS — Z79899 Other long term (current) drug therapy: Secondary | ICD-10-CM | POA: Insufficient documentation

## 2022-04-13 DIAGNOSIS — R404 Transient alteration of awareness: Secondary | ICD-10-CM | POA: Diagnosis not present

## 2022-04-13 DIAGNOSIS — I2510 Atherosclerotic heart disease of native coronary artery without angina pectoris: Secondary | ICD-10-CM | POA: Diagnosis not present

## 2022-04-13 DIAGNOSIS — R0689 Other abnormalities of breathing: Secondary | ICD-10-CM | POA: Diagnosis not present

## 2022-04-13 DIAGNOSIS — Z8589 Personal history of malignant neoplasm of other organs and systems: Secondary | ICD-10-CM | POA: Diagnosis not present

## 2022-04-13 DIAGNOSIS — R6889 Other general symptoms and signs: Secondary | ICD-10-CM | POA: Diagnosis not present

## 2022-04-13 DIAGNOSIS — Z85841 Personal history of malignant neoplasm of brain: Secondary | ICD-10-CM | POA: Diagnosis not present

## 2022-04-13 DIAGNOSIS — R7989 Other specified abnormal findings of blood chemistry: Secondary | ICD-10-CM | POA: Insufficient documentation

## 2022-04-13 DIAGNOSIS — R55 Syncope and collapse: Secondary | ICD-10-CM | POA: Insufficient documentation

## 2022-04-13 DIAGNOSIS — R42 Dizziness and giddiness: Secondary | ICD-10-CM | POA: Insufficient documentation

## 2022-04-13 DIAGNOSIS — I499 Cardiac arrhythmia, unspecified: Secondary | ICD-10-CM | POA: Diagnosis not present

## 2022-04-13 DIAGNOSIS — Z743 Need for continuous supervision: Secondary | ICD-10-CM | POA: Diagnosis not present

## 2022-04-13 DIAGNOSIS — J449 Chronic obstructive pulmonary disease, unspecified: Secondary | ICD-10-CM | POA: Diagnosis not present

## 2022-04-13 LAB — COMPREHENSIVE METABOLIC PANEL
ALT: 23 U/L (ref 0–44)
AST: 31 U/L (ref 15–41)
Albumin: 3.5 g/dL (ref 3.5–5.0)
Alkaline Phosphatase: 167 U/L — ABNORMAL HIGH (ref 38–126)
Anion gap: 8 (ref 5–15)
BUN: 27 mg/dL — ABNORMAL HIGH (ref 8–23)
CO2: 24 mmol/L (ref 22–32)
Calcium: 8.7 mg/dL — ABNORMAL LOW (ref 8.9–10.3)
Chloride: 106 mmol/L (ref 98–111)
Creatinine, Ser: 1.8 mg/dL — ABNORMAL HIGH (ref 0.61–1.24)
GFR, Estimated: 41 mL/min — ABNORMAL LOW (ref >60)
Glucose, Bld: 134 mg/dL — ABNORMAL HIGH (ref 70–99)
Potassium: 4 mmol/L (ref 3.5–5.1)
Sodium: 138 mmol/L (ref 135–145)
Total Bilirubin: 0.5 mg/dL (ref 0.3–1.2)
Total Protein: 7.1 g/dL (ref 6.5–8.1)

## 2022-04-13 LAB — URINALYSIS, ROUTINE W REFLEX MICROSCOPIC
Bilirubin Urine: NEGATIVE
Glucose, UA: NEGATIVE mg/dL
Ketones, ur: NEGATIVE mg/dL
Nitrite: NEGATIVE
Protein, ur: 30 mg/dL — AB
Specific Gravity, Urine: 1.011 (ref 1.005–1.030)
WBC, UA: >50 WBC/hpf (ref 0–5)
pH: 6 (ref 5.0–8.0)

## 2022-04-13 LAB — CBC WITH DIFFERENTIAL/PLATELET
Abs Immature Granulocytes: 0.02 10*3/uL (ref 0.00–0.07)
Basophils Absolute: 0.1 10*3/uL (ref 0.0–0.1)
Basophils Relative: 1 %
Eosinophils Absolute: 0.2 10*3/uL (ref 0.0–0.5)
Eosinophils Relative: 2 %
HCT: 40.7 % (ref 39.0–52.0)
Hemoglobin: 12.9 g/dL — ABNORMAL LOW (ref 13.0–17.0)
Immature Granulocytes: 0 %
Lymphocytes Relative: 15 %
Lymphs Abs: 1.1 10*3/uL (ref 0.7–4.0)
MCH: 28 pg (ref 26.0–34.0)
MCHC: 31.7 g/dL (ref 30.0–36.0)
MCV: 88.3 fL (ref 80.0–100.0)
Monocytes Absolute: 0.4 10*3/uL (ref 0.1–1.0)
Monocytes Relative: 6 %
Neutro Abs: 5.1 10*3/uL (ref 1.7–7.7)
Neutrophils Relative %: 76 %
Platelets: 235 10*3/uL (ref 150–400)
RBC: 4.61 MIL/uL (ref 4.22–5.81)
RDW: 16.4 % — ABNORMAL HIGH (ref 11.5–15.5)
WBC: 6.8 10*3/uL (ref 4.0–10.5)
nRBC: 0 % (ref 0.0–0.2)

## 2022-04-13 LAB — TROPONIN I (HIGH SENSITIVITY): Troponin I (High Sensitivity): 50 ng/L — ABNORMAL HIGH (ref <18)

## 2022-04-13 MED ORDER — SODIUM CHLORIDE 0.9 % IV SOLN: INTRAVENOUS | Status: DC

## 2022-04-13 MED ORDER — SODIUM CHLORIDE 0.9 % IV BOLUS
1000.0000 mL | Freq: Once | INTRAVENOUS | Status: AC
  Administered 2022-04-13: 1000 mL via INTRAVENOUS

## 2022-04-13 NOTE — ED Provider Notes (Signed)
[facility] Provider Note   CSN: [id number] Arrival date & time: 04/13/22  1209     History  Chief Complaint  Patient presents with   Dizziness    Michael [name] is a 68 y.o. male.  Pt is a 67 yo male with pmhx significant for head and neck cancer, iron deficiency anemia, COPD, CKD, CAD, and cardiomyopathy.  Pt said he was visiting a friend and felt dizzy. He's had dizzy spells, but this is a little different.  He is unable to describe how he felt.  EMS said bp in the 70s when they arrived.  They gave IVFs and bp in the 90s upon arrival to the ED.  Pt is feeling better.  No pain.  It looks like pt had a syncopal event in December.  A [employer] monitor ordered for pt.  Pt was supposed to call the office back and never did.          Home Medications Prior to Admission medications   Medication Sig Start Date End Date Taking? Authorizing Provider  albuterol (VENTOLIN HFA) 108 (90 Base) MCG/ACT inhaler Inhale 2 puffs into the lungs every 6 (six) hours as needed for wheezing or shortness of breath. 08/14/21  Yes Pappayliou, Barnetta Chapel A, DO  amLODipine (NORVASC) 5 MG tablet Take 5 mg by mouth daily. 10/12/21  Yes [provider]  atorvastatin (LIPITOR) 80 MG tablet Take 1 tablet (80 mg total) by mouth every evening. 08/10/21  Yes Swinyer, Lanice Schwab, NP  Budeson-Glycopyrrol-Formoterol (BREZTRI AEROSPHERE) 160-9-4.8 MCG/ACT AERO Inhale 2 puffs into the lungs 2 (two) times daily.   Yes [provider]  carvedilol (COREG) 12.5 MG tablet Take 1 tablet (12.5 mg total) by mouth 2 (two) times daily with a meal. 10/26/21  Yes Alekh, Kshitiz, MD  Evolocumab (REPATHA SURECLICK) [id number] MG/ML SOAJ Inject 1 pen into the skin every 14 (fourteen) days. 03/09/21  Yes Satira Sark, MD  isosorbide mononitrate (IMDUR) 60 MG 24 hr tablet Take 1 tablet (60 mg total) by mouth every evening. 11/09/21  Yes Florencia Reasons, MD  potassium chloride SA  (KLOR-CON M) 20 MEQ tablet Take 1 tablet (20 mEq total) by mouth 2 (two) times daily. 10/26/21  Yes Aline August, MD  clopidogrel (PLAVIX) 75 MG tablet Take 1 tablet (75 mg total) by mouth daily. Patient not taking: Reported on 04/13/2022 10/26/21   Aline August, MD  ergocalciferol (VITAMIN D2) 1.25 MG (50000 UT) capsule Take 1 capsule (50,000 Units total) by mouth once a week. Patient not taking: Reported on 04/13/2022 01/13/22   Harriett Rush, PA-C  furosemide (LASIX) 40 MG tablet Take 1 tablet (40 mg total) by mouth daily. Patient not taking: Reported on 04/13/2022 10/26/21 12/15/22  Aline August, MD  HYDROcodone-acetaminophen (NORCO) 5-325 MG tablet Take 2 tablets by mouth every 6 (six) hours as needed for moderate pain. Patient not taking: Reported on 04/13/2022 01/30/22   Fransico Meadow, PA-C  HYDROcodone-acetaminophen (NORCO/VICODIN) 5-325 MG tablet Take 1 tablet by mouth every 4 (four) hours as needed for moderate pain. Patient not taking: Reported on 04/13/2022 01/30/22 01/30/23  Fransico Meadow, PA-C  sacubitril-valsartan (ENTRESTO) 24-26 MG Take 1 tablet by mouth 2 (two) times daily. Patient not taking: Reported on 04/13/2022 12/14/21   Satira Sark, MD      Allergies    Patient has no known allergies.    Review of Systems   Review of Systems  All other systems reviewed and  are negative.   Physical Exam Updated Vital Signs BP (!) 129/91   Pulse 63   Resp 13   Ht 6' (1.829 m)   Wt 69.9 kg   SpO2 95%   BMI 20.89 kg/m  Physical Exam  ED Results / Procedures / Treatments   Labs (all labs ordered are listed, but only abnormal results are displayed) Labs Reviewed  CBC WITH DIFFERENTIAL/PLATELET - Abnormal; Notable for the following components:      Result Value   Hemoglobin 12.9 (*)    RDW 16.4 (*)    All other components within normal limits  COMPREHENSIVE METABOLIC PANEL - Abnormal; Notable for the following components:   Glucose, Bld 134 (*)    BUN 27  (*)    Creatinine, Ser 1.80 (*)    Calcium 8.7 (*)    Alkaline Phosphatase 167 (*)    GFR, Estimated 41 (*)    All other components within normal limits  TROPONIN I (HIGH SENSITIVITY) - Abnormal; Notable for the following components:   Troponin I (High Sensitivity) 50 (*)    All other components within normal limits  URINALYSIS, ROUTINE W REFLEX MICROSCOPIC  CBG MONITORING, ED  TROPONIN I (HIGH SENSITIVITY)    EKG EKG Interpretation  Date/Time:  Wednesday April 13 2022 12:18:33 EDT Ventricular Rate:  59 PR Interval:  292 QRS Duration: 119 QT Interval:  477 QTC Calculation: 473 R Axis:   70 Text Interpretation: Sinus rhythm Prolonged PR interval LVH with secondary repolarization abnormality Anterior Q waves, possibly due to LVH No significant change since last tracing Confirmed by Isla Pence [phone]) on 04/13/2022 12:27:23 PM  Radiology MR BRAIN WO CONTRAST  Result Date: 04/13/2022 CLINICAL DATA:  Stroke suspected EXAM: MRI HEAD WITHOUT CONTRAST TECHNIQUE: Multiplanar, multiecho pulse sequences of the brain and surrounding structures were obtained without intravenous contrast. COMPARISON:  MRI Head 06/04/17 FINDINGS: Brain: Negative for an acute infarct. Sequela of severe chronic microvascular ischemic change near confluence T2/FLAIR hyperintense periventricular signal abnormality. There are few small microhemorrhages in the bilateral thalami and cerebellar hemispheres, favored to be hypertensive in nature. No extra-axial fluid collection. Chronic infarct in the left basal ganglia. And bilateral corona radiata Vascular: Normal flow voids. Skull and upper cervical spine: There is a 1.5 cm cystic lesion in the subcutaneous soft tissues over the left occipital calvarium, favored to represent a small epidermal inclusion cyst. Postsurgical changes of the right orbital floor. Sinuses/Orbits: No middle ear or mastoid effusion. Mucosal thickening left maxillary sinus and bilateral ethmoid  sinuses. Other: None IMPRESSION: 1. No acute intracranial process. 2. Sequela of severe chronic microvascular ischemic change. Electronically Signed   By: Marin Roberts M.D.   On: 04/13/2022 15:25   DG Chest 1 View  Result Date: 04/13/2022 CLINICAL DATA:  near-syncope EXAM: CHEST  1 VIEW COMPARISON:  Chest x-ray 02/24/2022. FINDINGS: Chronic mild prominence of the lung markings. No consolidation. No visible pleural effusions or pneumothorax. Cardiomediastinal silhouette is unchanged. Polyarticular degenerative change. Osteopenia. No acute displaced fracture identified. IMPRESSION: No evidence of acute cardiopulmonary disease. Electronically Signed   By: Margaretha Sheffield M.D.   On: 04/13/2022 13:41    Procedures Procedures    Medications Ordered in ED Medications  sodium chloride 0.9 % bolus 1,000 mL (0 mLs Intravenous Stopped 04/13/22 1432)    And  0.9 %  sodium chloride infusion ( Intravenous New Bag/Given 04/13/22 1514)    ED Course/ Medical Decision Making/ A&P  Medical Decision Making Amount and/or Complexity of Data Reviewed Labs: ordered. Radiology: ordered.  Risk Prescription drug management.   This patient presents to the ED for concern of near syncope, this involves an extensive number of treatment options, and is a complaint that carries with it a high risk of complications and morbidity.  The differential diagnosis includes cardiogenic, vasogenic, orthostatic   Co morbidities that complicate the patient evaluation  neck cancer, iron deficiency anemia, COPD, CKD, CAD, and cardiomyopathy   Additional history obtained:  Additional history obtained from epic chart review External records from outside source obtained and reviewed including EMS report   Lab Tests:  I Ordered, and personally interpreted labs.  The pertinent results include:  cbc nl, cmp nl other than cr 1.8 (chronic); trop elevated at 50 (chronically elevated)   Imaging  Studies ordered:  I ordered imaging studies including cxr and mri brain  I independently visualized and interpreted imaging which showed  CXR: No evidence of acute cardiopulmonary disease.  MRI brain: IMPRESSION:  1. No acute intracranial process.  2. Sequela of severe chronic microvascular ischemic change.   I agree with the radiologist interpretation   Cardiac Monitoring:  The patient was maintained on a cardiac monitor.  I personally viewed and interpreted the cardiac monitored which showed an underlying rhythm of: nsr   Medicines ordered and prescription drug management:  I ordered medication including ivfs  for near-syncope  Reevaluation of the patient after these medicines showed that the patient improved I have reviewed the patients home medicines and have made adjustments as needed   Test Considered:  mri   Critical Interventions:  ivfs   Problem List / ED Course:  Near-syncope:  likely related to drop in bp.  Pt needs to f/u with cards to get the event monitor.  Pt feels much better now.  He is ambulatory.  Pt is stable for d/c.  Return if worse.    Reevaluation:  After the interventions noted above, I reevaluated the patient and found that they have :improved   Social Determinants of Health:  Lives at home   Dispostion:  After consideration of the diagnostic results and the patients response to treatment, I feel that the patent would benefit from discharge with outpatient f/u.          Final Clinical Impression(s) / ED Diagnoses Final diagnoses:  Near syncope  Dizziness    Rx / DC Orders ED Discharge Orders     None         Isla Pence, MD 04/13/22 1615

## 2022-04-13 NOTE — ED Triage Notes (Signed)
Pt brought in by [facility] for c/o dizziness for a "while" but pt states today the dizziness is different  Pt denies any pain  Ems reports pt had low BP with 70's/40's, pt was given 529m bolus NS with ems and pressures increased to 90's/60's

## 2022-04-17 ENCOUNTER — Telehealth: Payer: Self-pay | Admitting: Cardiology

## 2022-04-17 NOTE — Telephone Encounter (Signed)
Pt has been seen by cardiology and event monitor ordered twice.  Pt declined to wear.  He continues with syncope.  We will attempt a follow up appt.  I have sent note to his PCP as well.

## 2022-04-18 ENCOUNTER — Other Ambulatory Visit: Payer: Self-pay

## 2022-04-18 ENCOUNTER — Ambulatory Visit: Payer: Medicare Other | Admitting: Physician Assistant

## 2022-04-20 ENCOUNTER — Ambulatory Visit: Payer: Self-pay | Admitting: Physician Assistant

## 2022-04-20 ENCOUNTER — Inpatient Hospital Stay: Payer: 59 | Attending: Hematology

## 2022-04-25 ENCOUNTER — Other Ambulatory Visit: Payer: Self-pay

## 2022-04-25 ENCOUNTER — Inpatient Hospital Stay (HOSPITAL_COMMUNITY)
Admission: EM | Admit: 2022-04-25 | Discharge: 2022-04-28 | DRG: 311 | Disposition: A | Discharge status: Home / Self Care | Payer: 59 | Attending: Family Medicine | Admitting: Family Medicine

## 2022-04-25 ENCOUNTER — Emergency Department (HOSPITAL_COMMUNITY): Payer: 59

## 2022-04-25 DIAGNOSIS — I160 Hypertensive urgency: Secondary | ICD-10-CM | POA: Diagnosis not present

## 2022-04-25 DIAGNOSIS — N1832 Chronic kidney disease, stage 3b: Secondary | ICD-10-CM | POA: Diagnosis not present

## 2022-04-25 DIAGNOSIS — R7989 Other specified abnormal findings of blood chemistry: Secondary | ICD-10-CM

## 2022-04-25 DIAGNOSIS — Z515 Encounter for palliative care: Secondary | ICD-10-CM

## 2022-04-25 DIAGNOSIS — Z5901 Sheltered homelessness: Secondary | ICD-10-CM

## 2022-04-25 DIAGNOSIS — Z1152 Encounter for screening for COVID-19: Secondary | ICD-10-CM | POA: Diagnosis not present

## 2022-04-25 DIAGNOSIS — N183 Chronic kidney disease, stage 3 unspecified: Secondary | ICD-10-CM | POA: Diagnosis present

## 2022-04-25 DIAGNOSIS — Z5941 Food insecurity: Secondary | ICD-10-CM | POA: Diagnosis not present

## 2022-04-25 DIAGNOSIS — D649 Anemia, unspecified: Secondary | ICD-10-CM | POA: Diagnosis not present

## 2022-04-25 DIAGNOSIS — R6889 Other general symptoms and signs: Secondary | ICD-10-CM | POA: Diagnosis not present

## 2022-04-25 DIAGNOSIS — I255 Ischemic cardiomyopathy: Secondary | ICD-10-CM | POA: Diagnosis not present

## 2022-04-25 DIAGNOSIS — I472 Ventricular tachycardia, unspecified: Secondary | ICD-10-CM | POA: Diagnosis present

## 2022-04-25 DIAGNOSIS — E785 Hyperlipidemia, unspecified: Secondary | ICD-10-CM | POA: Diagnosis not present

## 2022-04-25 DIAGNOSIS — I159 Secondary hypertension, unspecified: Secondary | ICD-10-CM

## 2022-04-25 DIAGNOSIS — Z7902 Long term (current) use of antithrombotics/antiplatelets: Secondary | ICD-10-CM

## 2022-04-25 DIAGNOSIS — I2510 Atherosclerotic heart disease of native coronary artery without angina pectoris: Secondary | ICD-10-CM | POA: Diagnosis not present

## 2022-04-25 DIAGNOSIS — I10 Essential (primary) hypertension: Secondary | ICD-10-CM | POA: Diagnosis present

## 2022-04-25 DIAGNOSIS — Z79899 Other long term (current) drug therapy: Secondary | ICD-10-CM | POA: Diagnosis not present

## 2022-04-25 DIAGNOSIS — R0602 Shortness of breath: Secondary | ICD-10-CM | POA: Diagnosis not present

## 2022-04-25 DIAGNOSIS — I700 Atherosclerosis of aorta: Secondary | ICD-10-CM | POA: Diagnosis present

## 2022-04-25 DIAGNOSIS — I5022 Chronic systolic (congestive) heart failure: Secondary | ICD-10-CM | POA: Diagnosis present

## 2022-04-25 DIAGNOSIS — I440 Atrioventricular block, first degree: Secondary | ICD-10-CM | POA: Diagnosis not present

## 2022-04-25 DIAGNOSIS — Z8589 Personal history of malignant neoplasm of other organs and systems: Secondary | ICD-10-CM | POA: Diagnosis not present

## 2022-04-25 DIAGNOSIS — Z823 Family history of stroke: Secondary | ICD-10-CM | POA: Diagnosis not present

## 2022-04-25 DIAGNOSIS — R778 Other specified abnormalities of plasma proteins: Secondary | ICD-10-CM | POA: Diagnosis not present

## 2022-04-25 DIAGNOSIS — I5020 Unspecified systolic (congestive) heart failure: Secondary | ICD-10-CM | POA: Diagnosis not present

## 2022-04-25 DIAGNOSIS — E876 Hypokalemia: Secondary | ICD-10-CM

## 2022-04-25 DIAGNOSIS — I110 Hypertensive heart disease with heart failure: Secondary | ICD-10-CM | POA: Diagnosis not present

## 2022-04-25 DIAGNOSIS — J441 Chronic obstructive pulmonary disease with (acute) exacerbation: Secondary | ICD-10-CM | POA: Diagnosis present

## 2022-04-25 DIAGNOSIS — Z743 Need for continuous supervision: Secondary | ICD-10-CM | POA: Diagnosis not present

## 2022-04-25 DIAGNOSIS — R911 Solitary pulmonary nodule: Secondary | ICD-10-CM | POA: Insufficient documentation

## 2022-04-25 DIAGNOSIS — I2489 Other forms of acute ischemic heart disease: Principal | ICD-10-CM | POA: Diagnosis present

## 2022-04-25 DIAGNOSIS — Z85828 Personal history of other malignant neoplasm of skin: Secondary | ICD-10-CM

## 2022-04-25 DIAGNOSIS — J449 Chronic obstructive pulmonary disease, unspecified: Secondary | ICD-10-CM | POA: Diagnosis present

## 2022-04-25 DIAGNOSIS — Z59811 Housing instability, housed, with risk of homelessness: Secondary | ICD-10-CM | POA: Diagnosis not present

## 2022-04-25 DIAGNOSIS — Z7189 Other specified counseling: Secondary | ICD-10-CM | POA: Diagnosis not present

## 2022-04-25 DIAGNOSIS — Z87891 Personal history of nicotine dependence: Secondary | ICD-10-CM | POA: Diagnosis not present

## 2022-04-25 DIAGNOSIS — Z91148 Patient's other noncompliance with medication regimen for other reason: Secondary | ICD-10-CM

## 2022-04-25 DIAGNOSIS — R54 Age-related physical debility: Secondary | ICD-10-CM | POA: Diagnosis not present

## 2022-04-25 DIAGNOSIS — I5042 Chronic combined systolic (congestive) and diastolic (congestive) heart failure: Secondary | ICD-10-CM | POA: Diagnosis present

## 2022-04-25 LAB — COMPREHENSIVE METABOLIC PANEL
ALT: 28 U/L (ref 0–44)
AST: 35 U/L (ref 15–41)
Albumin: 3.5 g/dL (ref 3.5–5.0)
Alkaline Phosphatase: 168 U/L — ABNORMAL HIGH (ref 38–126)
Anion gap: 9 (ref 5–15)
BUN: 23 mg/dL (ref 8–23)
CO2: 24 mmol/L (ref 22–32)
Calcium: 8.6 mg/dL — ABNORMAL LOW (ref 8.9–10.3)
Chloride: 103 mmol/L (ref 98–111)
Creatinine, Ser: 2.03 mg/dL — ABNORMAL HIGH (ref 0.61–1.24)
GFR, Estimated: 35 mL/min — ABNORMAL LOW (ref >60)
Glucose, Bld: 120 mg/dL — ABNORMAL HIGH (ref 70–99)
Potassium: 2.7 mmol/L — CL (ref 3.5–5.1)
Sodium: 136 mmol/L (ref 135–145)
Total Bilirubin: 0.8 mg/dL (ref 0.3–1.2)
Total Protein: 6.8 g/dL (ref 6.5–8.1)

## 2022-04-25 LAB — CBC
HCT: 39.2 % (ref 39.0–52.0)
Hemoglobin: 12.7 g/dL — ABNORMAL LOW (ref 13.0–17.0)
MCH: 28.1 pg (ref 26.0–34.0)
MCHC: 32.4 g/dL (ref 30.0–36.0)
MCV: 86.7 fL (ref 80.0–100.0)
Platelets: 188 10*3/uL (ref 150–400)
RBC: 4.52 MIL/uL (ref 4.22–5.81)
RDW: 15.3 % (ref 11.5–15.5)
WBC: 10.5 10*3/uL (ref 4.0–10.5)
nRBC: 0 % (ref 0.0–0.2)

## 2022-04-25 LAB — RESP PANEL BY RT-PCR (RSV, FLU A&B, COVID)  RVPGX2
Influenza A by PCR: NEGATIVE
Influenza B by PCR: NEGATIVE
Resp Syncytial Virus by PCR: NEGATIVE
SARS Coronavirus 2 by RT PCR: NEGATIVE

## 2022-04-25 LAB — BRAIN NATRIURETIC PEPTIDE: B Natriuretic Peptide: 2310 pg/mL — ABNORMAL HIGH (ref 0.0–100.0)

## 2022-04-25 LAB — MAGNESIUM: Magnesium: 1.8 mg/dL (ref 1.7–2.4)

## 2022-04-25 LAB — TROPONIN I (HIGH SENSITIVITY): Troponin I (High Sensitivity): 87 ng/L — ABNORMAL HIGH (ref <18)

## 2022-04-25 MED ORDER — POTASSIUM CHLORIDE 20 MEQ PO PACK
80.0000 meq | PACK | ORAL | Status: AC
  Administered 2022-04-25: 80 meq via ORAL
  Filled 2022-04-25: qty 4

## 2022-04-25 MED ORDER — IOHEXOL 350 MG/ML SOLN
75.0000 mL | Freq: Once | INTRAVENOUS | Status: AC | PRN
  Administered 2022-04-25: 75 mL via INTRAVENOUS

## 2022-04-25 NOTE — ED Notes (Signed)
Pt back form CT

## 2022-04-25 NOTE — ED Notes (Signed)
ED Provider at bedside. 

## 2022-04-25 NOTE — ED Triage Notes (Signed)
Pt brought in by [facility] c/o difficulty breathing, states he feels like he cannot catch his breath. Per EMS pt was 100% on RA upon their arrival.  NAD.

## 2022-04-26 ENCOUNTER — Encounter (HOSPITAL_COMMUNITY): Payer: Self-pay | Admitting: Family Medicine

## 2022-04-26 DIAGNOSIS — Z515 Encounter for palliative care: Secondary | ICD-10-CM | POA: Diagnosis not present

## 2022-04-26 DIAGNOSIS — I5022 Chronic systolic (congestive) heart failure: Secondary | ICD-10-CM

## 2022-04-26 DIAGNOSIS — I472 Ventricular tachycardia, unspecified: Secondary | ICD-10-CM | POA: Diagnosis present

## 2022-04-26 DIAGNOSIS — Z5901 Sheltered homelessness: Secondary | ICD-10-CM | POA: Diagnosis not present

## 2022-04-26 DIAGNOSIS — J441 Chronic obstructive pulmonary disease with (acute) exacerbation: Secondary | ICD-10-CM | POA: Diagnosis not present

## 2022-04-26 DIAGNOSIS — R911 Solitary pulmonary nodule: Secondary | ICD-10-CM | POA: Diagnosis not present

## 2022-04-26 DIAGNOSIS — Z7189 Other specified counseling: Secondary | ICD-10-CM | POA: Diagnosis not present

## 2022-04-26 DIAGNOSIS — Z79899 Other long term (current) drug therapy: Secondary | ICD-10-CM | POA: Diagnosis not present

## 2022-04-26 DIAGNOSIS — R7989 Other specified abnormal findings of blood chemistry: Secondary | ICD-10-CM | POA: Diagnosis not present

## 2022-04-26 DIAGNOSIS — Z5900 Homelessness unspecified: Secondary | ICD-10-CM | POA: Diagnosis not present

## 2022-04-26 DIAGNOSIS — E785 Hyperlipidemia, unspecified: Secondary | ICD-10-CM | POA: Diagnosis present

## 2022-04-26 DIAGNOSIS — I5020 Unspecified systolic (congestive) heart failure: Secondary | ICD-10-CM | POA: Diagnosis not present

## 2022-04-26 DIAGNOSIS — E876 Hypokalemia: Secondary | ICD-10-CM | POA: Diagnosis not present

## 2022-04-26 DIAGNOSIS — N183 Chronic kidney disease, stage 3 unspecified: Secondary | ICD-10-CM | POA: Diagnosis not present

## 2022-04-26 DIAGNOSIS — I160 Hypertensive urgency: Secondary | ICD-10-CM

## 2022-04-26 DIAGNOSIS — Z85828 Personal history of other malignant neoplasm of skin: Secondary | ICD-10-CM | POA: Diagnosis not present

## 2022-04-26 DIAGNOSIS — J439 Emphysema, unspecified: Secondary | ICD-10-CM | POA: Diagnosis not present

## 2022-04-26 DIAGNOSIS — I2510 Atherosclerotic heart disease of native coronary artery without angina pectoris: Secondary | ICD-10-CM

## 2022-04-26 DIAGNOSIS — I209 Angina pectoris, unspecified: Secondary | ICD-10-CM | POA: Diagnosis not present

## 2022-04-26 DIAGNOSIS — Z87891 Personal history of nicotine dependence: Secondary | ICD-10-CM | POA: Diagnosis not present

## 2022-04-26 DIAGNOSIS — I700 Atherosclerosis of aorta: Secondary | ICD-10-CM | POA: Diagnosis present

## 2022-04-26 DIAGNOSIS — R54 Age-related physical debility: Secondary | ICD-10-CM | POA: Diagnosis present

## 2022-04-26 DIAGNOSIS — Z743 Need for continuous supervision: Secondary | ICD-10-CM | POA: Diagnosis not present

## 2022-04-26 DIAGNOSIS — I255 Ischemic cardiomyopathy: Secondary | ICD-10-CM | POA: Diagnosis present

## 2022-04-26 DIAGNOSIS — Z8589 Personal history of malignant neoplasm of other organs and systems: Secondary | ICD-10-CM | POA: Diagnosis not present

## 2022-04-26 DIAGNOSIS — I10 Essential (primary) hypertension: Secondary | ICD-10-CM

## 2022-04-26 DIAGNOSIS — I509 Heart failure, unspecified: Secondary | ICD-10-CM | POA: Diagnosis not present

## 2022-04-26 DIAGNOSIS — I110 Hypertensive heart disease with heart failure: Secondary | ICD-10-CM | POA: Diagnosis not present

## 2022-04-26 DIAGNOSIS — Z5941 Food insecurity: Secondary | ICD-10-CM

## 2022-04-26 DIAGNOSIS — J449 Chronic obstructive pulmonary disease, unspecified: Secondary | ICD-10-CM

## 2022-04-26 DIAGNOSIS — R069 Unspecified abnormalities of breathing: Secondary | ICD-10-CM | POA: Diagnosis not present

## 2022-04-26 DIAGNOSIS — Z59811 Housing instability, housed, with risk of homelessness: Secondary | ICD-10-CM

## 2022-04-26 DIAGNOSIS — R059 Cough, unspecified: Secondary | ICD-10-CM | POA: Diagnosis not present

## 2022-04-26 DIAGNOSIS — I440 Atrioventricular block, first degree: Secondary | ICD-10-CM | POA: Diagnosis present

## 2022-04-26 DIAGNOSIS — D649 Anemia, unspecified: Secondary | ICD-10-CM | POA: Diagnosis present

## 2022-04-26 DIAGNOSIS — Z1152 Encounter for screening for COVID-19: Secondary | ICD-10-CM | POA: Diagnosis not present

## 2022-04-26 DIAGNOSIS — Z823 Family history of stroke: Secondary | ICD-10-CM | POA: Diagnosis not present

## 2022-04-26 DIAGNOSIS — N1832 Chronic kidney disease, stage 3b: Secondary | ICD-10-CM | POA: Diagnosis not present

## 2022-04-26 DIAGNOSIS — I2489 Other forms of acute ischemic heart disease: Secondary | ICD-10-CM | POA: Diagnosis present

## 2022-04-26 DIAGNOSIS — R6889 Other general symptoms and signs: Secondary | ICD-10-CM | POA: Diagnosis not present

## 2022-04-26 DIAGNOSIS — R0602 Shortness of breath: Secondary | ICD-10-CM | POA: Diagnosis not present

## 2022-04-26 LAB — BASIC METABOLIC PANEL
Anion gap: 13 (ref 5–15)
BUN: 22 mg/dL (ref 8–23)
CO2: 21 mmol/L — ABNORMAL LOW (ref 22–32)
Calcium: 8.9 mg/dL (ref 8.9–10.3)
Chloride: 102 mmol/L (ref 98–111)
Creatinine, Ser: 1.98 mg/dL — ABNORMAL HIGH (ref 0.61–1.24)
GFR, Estimated: 36 mL/min — ABNORMAL LOW (ref >60)
Glucose, Bld: 99 mg/dL (ref 70–99)
Potassium: 3.7 mmol/L (ref 3.5–5.1)
Sodium: 136 mmol/L (ref 135–145)

## 2022-04-26 LAB — TROPONIN I (HIGH SENSITIVITY)
Troponin I (High Sensitivity): 107 ng/L (ref <18)
Troponin I (High Sensitivity): 119 ng/L (ref <18)
Troponin I (High Sensitivity): 96 ng/L — ABNORMAL HIGH (ref <18)

## 2022-04-26 MED ORDER — ALBUTEROL SULFATE (2.5 MG/3ML) 0.083% IN NEBU: 3.0000 mL | INHALATION_SOLUTION | Freq: Four times a day (QID) | RESPIRATORY_TRACT | Status: DC | PRN

## 2022-04-26 MED ORDER — ONDANSETRON HCL 4 MG PO TABS: 4.0000 mg | ORAL_TABLET | Freq: Four times a day (QID) | ORAL | Status: DC | PRN

## 2022-04-26 MED ORDER — LABETALOL HCL 5 MG/ML IV SOLN
20.0000 mg | Freq: Once | INTRAVENOUS | Status: AC
  Administered 2022-04-26: 20 mg via INTRAVENOUS
  Filled 2022-04-26: qty 4

## 2022-04-26 MED ORDER — ACETAMINOPHEN 325 MG PO TABS: 650.0000 mg | ORAL_TABLET | Freq: Four times a day (QID) | ORAL | Status: DC | PRN

## 2022-04-26 MED ORDER — POTASSIUM CHLORIDE 10 MEQ/100ML IV SOLN
10.0000 meq | INTRAVENOUS | Status: AC
  Administered 2022-04-26 (×2): 10 meq via INTRAVENOUS
  Filled 2022-04-26 (×2): qty 100

## 2022-04-26 MED ORDER — ACETAMINOPHEN 650 MG RE SUPP: 650.0000 mg | Freq: Four times a day (QID) | RECTAL | Status: DC | PRN

## 2022-04-26 MED ORDER — CLOPIDOGREL BISULFATE 75 MG PO TABS
75.0000 mg | ORAL_TABLET | Freq: Every day | ORAL | Status: DC
  Administered 2022-04-26 – 2022-04-28 (×3): 75 mg via ORAL
  Filled 2022-04-26 (×3): qty 1

## 2022-04-26 MED ORDER — MOMETASONE FURO-FORMOTEROL FUM 100-5 MCG/ACT IN AERO
2.0000 | INHALATION_SPRAY | Freq: Two times a day (BID) | RESPIRATORY_TRACT | Status: DC
  Administered 2022-04-27 – 2022-04-28 (×3): 2 via RESPIRATORY_TRACT
  Filled 2022-04-26: qty 8.8

## 2022-04-26 MED ORDER — AMLODIPINE BESYLATE 5 MG PO TABS
5.0000 mg | ORAL_TABLET | Freq: Every day | ORAL | Status: DC
  Administered 2022-04-26 – 2022-04-28 (×3): 5 mg via ORAL
  Filled 2022-04-26 (×3): qty 1

## 2022-04-26 MED ORDER — ATORVASTATIN CALCIUM 40 MG PO TABS
80.0000 mg | ORAL_TABLET | Freq: Every evening | ORAL | Status: DC
  Administered 2022-04-26 – 2022-04-27 (×2): 80 mg via ORAL
  Filled 2022-04-26 (×2): qty 2

## 2022-04-26 MED ORDER — HYDRALAZINE HCL 25 MG PO TABS
25.0000 mg | ORAL_TABLET | Freq: Once | ORAL | Status: AC
  Administered 2022-04-26: 25 mg via ORAL
  Filled 2022-04-26: qty 1

## 2022-04-26 MED ORDER — CARVEDILOL 12.5 MG PO TABS
12.5000 mg | ORAL_TABLET | Freq: Two times a day (BID) | ORAL | Status: DC
  Administered 2022-04-26 – 2022-04-28 (×4): 12.5 mg via ORAL
  Filled 2022-04-26 (×4): qty 1

## 2022-04-26 MED ORDER — ONDANSETRON HCL 4 MG/2ML IJ SOLN: 4.0000 mg | Freq: Four times a day (QID) | INTRAMUSCULAR | Status: DC | PRN

## 2022-04-26 MED ORDER — ENOXAPARIN SODIUM 40 MG/0.4ML IJ SOSY
40.0000 mg | PREFILLED_SYRINGE | INTRAMUSCULAR | Status: DC
  Administered 2022-04-26 – 2022-04-27 (×2): 40 mg via SUBCUTANEOUS
  Filled 2022-04-26 (×2): qty 0.4

## 2022-04-26 MED ORDER — UMECLIDINIUM BROMIDE 62.5 MCG/ACT IN AEPB
1.0000 | INHALATION_SPRAY | Freq: Every day | RESPIRATORY_TRACT | Status: DC
  Administered 2022-04-27 – 2022-04-28 (×2): 1 via RESPIRATORY_TRACT
  Filled 2022-04-26: qty 7

## 2022-04-26 MED ORDER — SACUBITRIL-VALSARTAN 24-26 MG PO TABS
1.0000 | ORAL_TABLET | Freq: Two times a day (BID) | ORAL | Status: DC
  Administered 2022-04-26 – 2022-04-28 (×5): 1 via ORAL
  Filled 2022-04-26 (×5): qty 1

## 2022-04-26 MED ORDER — BUDESON-GLYCOPYRROL-FORMOTEROL 160-9-4.8 MCG/ACT IN AERO: 2.0000 | INHALATION_SPRAY | Freq: Two times a day (BID) | RESPIRATORY_TRACT | Status: DC

## 2022-04-26 MED ORDER — MAGNESIUM SULFATE 2 GM/50ML IV SOLN
2.0000 g | Freq: Once | INTRAVENOUS | Status: AC
  Administered 2022-04-26: 2 g via INTRAVENOUS
  Filled 2022-04-26: qty 50

## 2022-04-26 MED ORDER — ISOSORBIDE MONONITRATE ER 60 MG PO TB24
60.0000 mg | ORAL_TABLET | Freq: Every evening | ORAL | Status: DC
  Administered 2022-04-26 – 2022-04-27 (×2): 60 mg via ORAL
  Filled 2022-04-26 (×2): qty 1

## 2022-04-26 NOTE — ED Notes (Signed)
EKG changes noted. Printed new EKG and gave to [facility]. See orders

## 2022-04-26 NOTE — Consult Note (Addendum)
Cardiology Consultation   Patient ID: Michael [name] MRN: [id number]; DOB: 1955-08-31  Admit date: 04/25/2022 Date of Consult: 04/26/2022  PCP:  Michael [name], [place] [facility] Cardiologist:  Michael Lesches, MD        Patient Profile:   Michael [name] is a 67 y.o. male with a hx of HFrEF (EF 30% in 10/2021), CAD (cath in 10/2021 showing mild to moderate obstructive CAD along mid-RCA and acute Mrg with medical management recommended), carotid artery stenosis, iron deficiency anemia, HTN, HLD and Stage 3 CKD who is being seen 04/26/2022 for the evaluation of elevated troponin values at the request of Dr. Josephine [name].  History of Present Illness:   Michael [name] was last examined in clinic by Michael Kicks, NP in 12/2021 and reported overall feeling well at that time. He was continued on his current dosing of Entresto, Aldactone, Coreg and Lasix with plans to add an SGLT2 inhibitor based off follow-up labs.  He presented to [facility] ED on 04/25/2022 for evaluation of worsening dyspnea. Oxygen saturations were 100% on room air. In talking with the patient today, he reports he has been short of breath "for the past year" but came in due to feeling bad yesterday. Denies any specific chest pain, palpitations, nausea or vomiting. No specific orthopnea, PND or pitting edema. Says his weight has actually declined by 5 pounds within the past week. He does report he frequently forgets his evening medications and would previously take both AM and PM medications at the same time in the morning. Reports social issues as he was staying in a hotel for the past 4 days and says he did not take his medications yesterday.  Initial labs showed WBC 10.5, Hgb 12.7, platelets 188, Na+ 136, K+ 2.7 and creatinine 2.03 (baseline 1.5-1.6).  BNP elevated to 2310.  Initial and repeat troponin values flat at 87, 96, 119 and 107. CXR showed chronic interstitial prominence and hyperinflation of the lungs with no  acute abnormalities. CTA showed no evidence of a PE but was noted to have mild cardiomegaly and a new 4 mm left solid pulmonary nodule. Initial EKG showed normal sinus rhythm, rate 77 with first-degree AV block and frequent PVCs along with T wave inversion along the lateral leads. Repeat EKG showed more prominent T waves but also appears to have a bundle branch block.   Past Medical History:  Diagnosis Date   Aortic atherosclerosis (Michael [name]) 08/03/2021   CAD (coronary artery disease) 08/03/2021   Cardiac catheterization September 2023 with RCA/RV marginal stenosis managed medically   Cardiomyopathy ([place])    Carotid artery disease ([place])    CKD (chronic kidney disease) stage 3, GFR 30-59 ml/min (HCC)    COPD (chronic obstructive pulmonary disease) (HCC)    Essential hypertension    Head and neck cancer ([place]) 2019   Right facial basal cell carcinoma s/p resection with right partial mastectomy and partal rhinectomy with skin graft (06/13/17)   Iron deficiency anemia    Urinary retention     Past Surgical History:  Procedure Laterality Date   ANKLE CLOSED REDUCTION Right    open reduction   BASAL CELL CARCINOMA EXCISION  2019   at [facility]   BIOPSY  07/19/2021   Procedure: BIOPSY;  Surgeon: Michael Harman, DO;  [facility];  Service: Endoscopy;;   COLONOSCOPY N/A 05/31/2017   Procedure: COLONOSCOPY;  Surgeon: Michael Dolin, MD;  [facility];  Service: Endoscopy;  Laterality: N/A;  2:45pm   COLONOSCOPY WITH PROPOFOL N/A 07/19/2021   Procedure: COLONOSCOPY WITH PROPOFOL;  Surgeon: Michael Harman, DO;  [facility];  Service: Endoscopy;  Laterality: N/A;  3:00pm, moved up to 9:00   CYSTOSCOPY N/A 10/29/2018   Procedure: CYSTOSCOPY, CLOT EVACUATION;  Surgeon: Michael Mons, MD;  Location: [facility];  Service: Urology;  Laterality: N/A;   PARTIAL COLECTOMY Right 08/11/2021   Procedure: PARTIAL COLECTOMY, OPEN RIGHT HEMICOLECTOMY;  Surgeon: Michael Cagey, MD;  Location: [facility];  Service: General;  Laterality: Right;   POLYPECTOMY  05/31/2017   Procedure: POLYPECTOMY;  Surgeon: Michael Dolin, MD;  [facility];  Service: Endoscopy;;   RIGHT/LEFT HEART CATH AND CORONARY ANGIOGRAPHY N/A 10/22/2021   Procedure: RIGHT/LEFT HEART CATH AND CORONARY ANGIOGRAPHY;  Surgeon: Michael Osmond, MD;  Location: [facility];  Service: Cardiovascular;  Laterality: N/A;   TONSILLECTOMY     TRANSCAROTID ARTERY REVASCULARIZATION  Right 02/15/2021   Procedure: RIGHT TRANSCAROTID ARTERY REVASCULARIZATION;  Surgeon: Michael Heck, MD;  Location: [facility];  Service: Vascular;  Laterality: Right;   ULTRASOUND GUIDANCE FOR VASCULAR ACCESS Left 02/15/2021   Procedure: ULTRASOUND GUIDANCE FOR VASCULAR ACCESS;  Surgeon: Michael Heck, MD;  Location: [facility];  Service: Vascular;  Laterality: Left;   XI ROBOTIC ASSISTED SIMPLE PROSTATECTOMY N/A 10/29/2018   Procedure: XI ROBOTIC ASSISTED SIMPLE PROSTATECTOMY;  Surgeon: Michael Gustin, MD;  Location[facility];  Service: Urology;  Laterality: N/A;     Home Medications:  Prior to Admission medications   Medication Sig Start Date End Date Taking? Authorizing Provider  albuterol (VENTOLIN HFA) 108 (90 Base) MCG/ACT inhaler Inhale 2 puffs into the lungs every 6 (six) hours as needed for wheezing or shortness of breath. 08/14/21   [name], Michael Chapel A, DO  amLODipine (NORVASC) 5 MG tablet Take 5 mg by mouth daily. 10/12/21   [provider]  atorvastatin (LIPITOR) 80 MG tablet Take 1 tablet (80 mg total) by mouth every evening. 08/10/21   [name], Michael Schwab, NP  Budeson-Glycopyrrol-Formoterol (BREZTRI AEROSPHERE) 160-9-4.8 MCG/ACT AERO Inhale 2 puffs into the lungs 2 (two) times daily.    [provider]  carvedilol (COREG) 12.5 MG tablet Take 1 tablet (12.5 mg total) by mouth 2 (two) times daily with a meal. 10/26/21   Michael August, MD  clopidogrel (PLAVIX) 75 MG tablet Take 1  tablet (75 mg total) by mouth daily. Patient not taking: Reported on 04/13/2022 10/26/21   Michael August, MD  ergocalciferol (VITAMIN D2) 1.25 MG (50000 UT) capsule Take 1 capsule (50,000 Units total) by mouth once a week. Patient not taking: Reported on 04/13/2022 01/13/22   Harriett Rush, PA-C  Evolocumab (REPATHA SURECLICK) [id number] MG/ML SOAJ Inject 1 pen into the skin every 14 (fourteen) days. 03/09/21   Satira Sark, MD  furosemide (LASIX) 40 MG tablet Take 1 tablet (40 mg total) by mouth daily. Patient not taking: Reported on 04/13/2022 10/26/21 12/15/22  Michael August, MD  HYDROcodone-acetaminophen (NORCO) 5-325 MG tablet Take 2 tablets by mouth every 6 (six) hours as needed for moderate pain. Patient not taking: Reported on 04/13/2022 01/30/22   Fransico Meadow, PA-C  HYDROcodone-acetaminophen (NORCO/VICODIN) 5-325 MG tablet Take 1 tablet by mouth every 4 (four) hours as needed for moderate pain. Patient not taking: Reported on 04/13/2022 01/30/22 01/30/23  Fransico Meadow, PA-C  isosorbide mononitrate (IMDUR) 60 MG 24 hr tablet Take 1 tablet (60 mg total) by mouth every  evening. 11/09/21   Florencia Reasons, MD  potassium chloride SA (KLOR-CON M) 20 MEQ tablet Take 1 tablet (20 mEq total) by mouth 2 (two) times daily. 10/26/21   Michael August, MD  sacubitril-valsartan (ENTRESTO) 24-26 MG Take 1 tablet by mouth 2 (two) times daily. Patient not taking: Reported on 04/13/2022 12/14/21   Satira Sark, MD    Inpatient Medications: Scheduled Meds:  enoxaparin (LOVENOX) injection  40 mg Subcutaneous Q24H   Continuous Infusions:  PRN Meds: acetaminophen **OR** acetaminophen, ondansetron **OR** ondansetron (ZOFRAN) IV  Allergies:   No Known Allergies  Social History:   Social History   Socioeconomic History   Marital status: Single    Spouse name: Not on file   Number of children: Not on file   Years of education: Not on file   Highest education level: Not on file  Occupational  History   Not on file  Tobacco Use   Smoking status: Former    Packs/day: 0.50    Years: 40.00    Additional pack years: 0.00    Total pack years: 20.00    Types: Cigarettes    Quit date: 07/02/2021    Years since quitting: 0.8    Passive exposure: Never   Smokeless tobacco: Never   Tobacco comments:    Quit on 06/09/21  Vaping Use   Vaping Use: Never used  Substance and Sexual Activity   Alcohol use: Not Currently   Drug use: Never   Sexual activity: Not Currently  Other Topics Concern   Not on file  Social History Narrative   Not on file   Social Determinants of Health   Financial Resource Strain: Low Risk  (12/11/2019)   Overall Financial Resource Strain (CARDIA)    Difficulty of Paying Living Expenses: Not hard at all  Food Insecurity: No Food Insecurity (10/20/2021)   Hunger Vital Sign    Worried About Running Out of Food in the Last Year: Never true    Ran Out of Food in the Last Year: Never true  Transportation Needs: No Transportation Needs (10/20/2021)   PRAPARE - Hydrologist (Medical): No    Lack of Transportation (Non-Medical): No  Physical Activity: Inactive (12/11/2019)   Exercise Vital Sign    Days of Exercise per Week: 0 days    Minutes of Exercise per Session: 0 min  Stress: No Stress Concern Present (12/11/2019)   [place]    Feeling of Stress : Not at all  Social Connections: Socially Isolated (12/11/2019)   Social Connection and Isolation Panel [NHANES]    Frequency of Communication with Friends and Family: Never    Frequency of Social Gatherings with Friends and Family: Never    Attends Religious Services: Never    Marine scientist or Organizations: No    Attends Archivist Meetings: Never    Marital Status: Divorced  Human resources officer Violence: Not At Risk (10/20/2021)   Humiliation, Afraid, Rape, and Kick questionnaire    Fear of  Current or Ex-Partner: No    Emotionally Abused: No    Physically Abused: No    Sexually Abused: No    Family History:    Family History  Problem Relation Age of Onset   Stroke Father    Cirrhosis Mother    Colon cancer Neg Hx      ROS:  Please see the history of present illness.   All other ROS  reviewed and negative.     Physical Exam/Data:   Vitals:   04/26/22 0700 04/26/22 0719 04/26/22 0800 04/26/22 0827  BP:   (!) 143/81 (!) 161/92  Pulse: 74  73 72  Resp: (!) 22  17 19   Temp:  98.4 F (36.9 C)  97.6 F (36.4 C)  TempSrc:  Oral    SpO2: 96%  96% 98%  Weight:      Height:       No intake or output data in the 24 hours ending 04/26/22 0926    04/25/2022    9:14 PM 04/13/2022   12:23 PM 02/24/2022   11:45 AM  Last 3 Weights  Weight (lbs) 155 lb 154 lb 148 lb  Weight (kg) 70.308 kg 69.854 kg 67.132 kg     Body mass index is 21.02 kg/m.  General: Elderly male appearing in no acute distress.  HEENT: Prior nasal reconstructive surgery noted.  Neck: no JVD Vascular: No carotid bruits; Distal pulses 2+ bilaterally Cardiac:  normal S1, S2; RRR; no murmur Lungs:  clear to auscultation bilaterally, no wheezing, rhonchi or rales  Abd: soft, nontender, no hepatomegaly  Ext: no pitting edema Musculoskeletal:  No deformities, BUE and BLE strength normal and equal Skin: warm and dry  Neuro:  CNs 2-12 intact, no focal abnormalities noted Psych:  Normal affect   EKG:  The EKG was personally reviewed and demonstrates: normal sinus rhythm, rate 77 with first-degree AV block and frequent PVCs along with T wave inversion along the lateral leads.  Telemetry:  Telemetry was personally reviewed and demonstrates: NSR, HR in 60's to 70's with occasional PVC's.   Relevant CV Studies:  Echocardiogram: 10/2021 IMPRESSIONS    1. LV function is depressed with diffuse hypokinesis in the lateral and  inferiror walls Compared to echo from 2019, LVEF is worse. . Left  ventricular  ejection fraction, by estimation, is 30%. The left ventricle  has moderately decreased function. The  left ventricular internal cavity size was severely dilated. There is mild  left ventricular hypertrophy. Left ventricular diastolic parameters are  consistent with Grade III diastolic dysfunction (restrictive). Elevated  left atrial pressure.   2. Right ventricular systolic function is low normal. The right  ventricular size is normal. There is normal pulmonary artery systolic  pressure.   3. Left atrial size was severely dilated.   4. Right atrial size was moderately dilated.   5. A small pericardial effusion is present. The pericardial effusion is  circumferential.   6. Mild mitral valve regurgitation.   7. The aortic valve is tricuspid. Aortic valve regurgitation is not  visualized. Aortic valve sclerosis is present, with no evidence of aortic  valve stenosis.   8. The inferior vena cava dilated.   Cardiac Catheterization: 10/2021   Mid RCA lesion is 70% stenosed.   Acute Mrg lesion is 80% stenosed.   1.  Mild to moderate obstructive coronary artery disease that is outweighed by the patient's degree of LV dysfunction.  There is a significant bifurcation lesion of the right coronary artery and large RV marginal branch.  This is moderately calcified and relatively tortuous.  Given the patient's lack of chest pain, medical therapy should be pursued. 2.  Cardiac output of 6.3 L/min, cardiac index of 3.3 L/min/m, mean RA pressure of 16 mmHg, mean wedge pressure of 21 mmHg, and LVEDP of 21 mmHg.   Recommendation: Goal-directed medical therapy with augmentation of diuretics; consider dual antiplatelet therapy for 1 year for medical treatment  of acute coronary syndrome.   Laboratory Data:  High Sensitivity Troponin:   Recent Labs  Lab 04/13/22 1505 04/25/22 2156 04/25/22 2332 04/26/22 0152 04/26/22 0417  TROPONINIHS 50* 87* 96* 119* 107*     Chemistry Recent Labs  Lab  04/25/22 2156  NA 136  K 2.7*  CL 103  CO2 24  GLUCOSE 120*  BUN 23  CREATININE 2.03*  CALCIUM 8.6*  MG 1.8  GFRNONAA 35*  ANIONGAP 9    Recent Labs  Lab 04/25/22 2156  PROT 6.8  ALBUMIN 3.5  AST 35  ALT 28  ALKPHOS 168*  BILITOT 0.8   Hematology Recent Labs  Lab 04/25/22 2156  WBC 10.5  RBC 4.52  HGB 12.7*  HCT 39.2  MCV 86.7  MCH 28.1  MCHC 32.4  RDW 15.3  PLT 188   BNP Recent Labs  Lab 04/25/22 2156  BNP 2,310.0*    Radiology/Studies:  CT Angio Chest PE W and/or Wo Contrast  Result Date: 04/25/2022 CLINICAL DATA:  Shortness of breath EXAM: CT ANGIOGRAPHY CHEST WITH CONTRAST TECHNIQUE: Multidetector CT imaging of the chest was performed using the standard protocol during bolus administration of intravenous contrast. Multiplanar CT image reconstructions and MIPs were obtained to evaluate the vascular anatomy. RADIATION DOSE REDUCTION: This exam was performed according to the departmental dose-optimization program which includes automated exposure control, adjustment of the mA and/or kV according to patient size and/or use of iterative reconstruction technique. CONTRAST:  50mL OMNIPAQUE IOHEXOL 350 MG/ML SOLN COMPARISON:  CT angiogram chest 11/13/2021.  CT abdomen 01/25/2022. FINDINGS: Cardiovascular: Heart is mildly enlarged. Aorta is normal in size. There is no pericardial effusion. There are atherosclerotic calcifications of the aorta. There is adequate opacification of the pulmonary arteries to the segmental level. There is no evidence for pulmonary embolism. Mediastinum/Nodes: There is a heterogeneous left thyroid nodule measuring up to 2.5 cm similar to prior. There are no enlarged mediastinal or hilar lymph nodes. Esophagus is nondilated. Lungs/Pleura: Mild emphysematous changes are present. There stable nodular densities in the right lower lobe measuring up to 3 mm. There is a new rounded 4 mm nodule in the superior segment of the left lower lobe image 6/43.  The lungs are otherwise clear. There is no pleural effusion or pneumothorax. Upper Abdomen: Stable hypodense lesions in the liver. Stable pancreatic calcifications. Musculoskeletal: No chest wall abnormality. No acute or significant osseous findings. Review of the MIP images confirms the above findings. IMPRESSION: 1. No evidence for pulmonary embolism. 2. Mild cardiomegaly. 3. New 4 mm left solid pulmonary nodule. Per Fleischner Society Guidelines, no routine follow-up imaging is recommended. These guidelines do not apply to immunocompromised patients and patients with cancer. Follow up in patients with significant comorbidities as clinically warranted. For lung cancer screening, adhere to Lung-RADS guidelines. Reference: Radiology. 2017; 284(1):228-43. 4. Stable right lower lobe pulmonary nodules measuring up to 3 mm. Aortic Atherosclerosis (ICD10-I70.0). Electronically Signed   By: Ronney Asters M.D.   On: 04/25/2022 23:35   DG Chest Port 1 View  Result Date: 04/25/2022 CLINICAL DATA:  Shortness of breath. EXAM: PORTABLE CHEST 1 VIEW COMPARISON:  04/13/2022. FINDINGS: The heart is enlarged and the mediastinal contour is within normal limits. There is atherosclerotic calcification of the aorta. Hyperinflation of the lungs is noted. There is chronic prominent interstitial thickening. No consolidation, effusion, or pneumothorax. No acute osseous abnormality. IMPRESSION: 1. Chronic interstitial prominence and hyperinflation of the lungs with no acute abnormality. 2. Cardiomegaly. Electronically Signed   By: Mickel Baas  Lovena Le M.D.   On: 04/25/2022 22:18     Assessment and Plan:   1. Elevated Troponin Values/CAD - Troponin values have overall been flat, peaking at 119 and trending down to 107 on most recent check. This is likely secondary to demand ischemia in the setting of his CHF, CKD and known CAD. Given prior catheterization in 10/2021 showed mild to moderate obstructive CAD with medical management  recommended, would not anticipate further ischemic testing at this time. He also denies any recent anginal symptoms and would be at high-risk for contrast-induced nephropathy given his CKD. - Continue medical management with Plavix 75 mg daily, Imdur 60 mg daily, Coreg 12.5 mg twice daily and Atorvastatin 80 mg daily.  2. Chronic HFrEF - His EF was at 30% in 10/2021. While his BNP is elevated to 310, he does not appear significantly volume overloaded on examination today. He has been noncompliant with his medications as outlined above and sometimes takes his AM and PM medications in the morning and frequently forgets his evening medications. We reviewed the importance of compliance with medications and different ways to remind him of this. - Would resume Coreg 12.5 mg twice daily and Entresto 24-26 mg twice daily. He has not been taking Spironolactone and based off repeat labs this morning, would consider restarting this as this will also help with his hypokalemia. Can consider an SGLT2 inhibitor as well based off follow-up labs and reassessment of renal function. Would obtain a repeat echocardiogram once he has been compliant with GDMT.   3. HTN - BP was recorded as high as 220/104 since admission, at 161/92 on most recent check.  He did report being without his medications the day of admission. He has been restarted on Amlodipine 5 mg daily and will order his PTA Coreg 12.5 mg twice daily, Imdur 60 mg daily and Entresto 24-26 mg twice daily.  Amlodipine can be discontinued if this limits titration of GDMT.  4. Stage 3 CKD - His baseline creatinine is 1.5 - 1.6, elevated 2.03 on admission. Lasix is currently held.  Will recheck a BMET this AM.   5. Hypokalemia - K+ at 2.7 on admission and he received supplementation. Will recheck this AM.  6. Social Issues - He reports recently living in a hotel and being without food the past few days. Recommend Social Work consult.   For questions or updates,  please contact [place] Please consult [email or web address] for contact info under    Signed, Erma Heritage, PA-C  04/26/2022 9:26 AM   Attending note:  Patient seen and examined.  Chart reviewed and case discussed with Ms. Ahmed Prima PA-C, I agree with her above findings.  Michael [name] presents reporting worsening shortness of breath over baseline.  Denies chest pain or palpitations, no orthopnea or PND.  He has been living in a hotel recently, states that he sometimes forgets to take his evening medications and doubles them up the next morning.  Cardiology consulted given abnormal high-sensitivity troponin I levels.  He has a history of HFrEF with mixed cardiomyopathy, LVEF 30% by echocardiogram in September 2023 and cardiac catheterization at that time demonstrating mild to moderate CAD with bifurcational disease involving the RCA/RV marginal felt to be best managed medically.  Cardiomyopathy out of proportion to degree of CAD at that time.  On examination this morning he reports no breathlessness at rest, no chest pain.  He is afebrile.  Heart rate in the 70s in sinus rhythm by telemetry.  Systolic blood  pressure ranging 120s to 170s.  Lungs exhibit decreased breath sounds throughout.  Cardiac exam with distant regular heart sounds and no gallop.  No peripheral edema.  Pertinent lab work include potassium 2.7, BUN 23, creatinine 2.03, normal LFTs, BNP 2310, relatively flat high-sensitivity troponin I elevation in the 80-120 range, hemoglobin 12.7, platelets 188.  Chest CTA negative for pulmonary embolus.  4 mm left-sided solid pulmonary nodule noted with otherwise stable right lower lobe pulmonary nodules.  ECG shows sinus rhythm with IVCD and PVCs.  Shortness of breath over baseline with known history of ischemic heart disease and HFrEF, LVEF approximately 30%.  Do not suspect ACS based on current high-sensitivity troponin I trend.  Recommend medical therapy.  Agree with holding  Lasix given renal insufficiency, potassium needs to be repleted as well.  Resume Coreg and Entresto, hold off on Aldactone for now.  Suggest case management consultation to address social issues.  Satira Sark, M.D., F.A.C.C.

## 2022-04-26 NOTE — ED Notes (Signed)
[place] in room to [employer]

## 2022-04-26 NOTE — Progress Notes (Signed)
PROGRESS NOTE    Patient: Michael [name]                            PCP: [name], MD                    DOB: 11-27-1955            DOA: 04/25/2022 [id number]             DOS: 04/26/2022, 10:40 AM   LOS: 0 days   Date of Service: The patient was seen and examined on 04/26/2022  Subjective:   The patient was seen and examined this morning. Hemodynamically stable. No issues overnight .  Denies any chest pain or shortness of breath this morning  Brief Narrative:   Mattis Perl is a 67 y.o. male with medical history significant of hypertension, hyperlipidemia, COPD, chronic normocytic anemia, head and neck cancer status post partial face and nose resection and construction, CAD, chronic HFrEF, CKD 3B   Presented emergency department due to complaint of shortness of breath.  He complained of chronic cough and subjective shortness of breath.  Patient denies chest pain, fever, chills, nausea, vomiting.  EMS was activated, and on arrival of EMS team, patient complained of difficulty breathing and that he was unable to catch his breath, however, O2 sat was 100% on room air when EMS checked.   ED Course:  BP was 140/95 on arrival to the ED, other vital signs were within normal range.  Workup in the ED showed normocytic anemia, BMP showed sodium 136, potassium 2.7, chloride 103, bicarb 24, blood glucose 120, BUN 23, creatinine 2.03, ALP 168, EGFR 35.  Troponin 87 > 96 > 119 > 107.  BNP 2,310 (chronically elevated, this was 2644 on 11/13/2021 and was > 4,500 on 11/07/2021).   Influenza A, B, SARS coronavirus 2, RSV was negative. Chest x-ray showed chronic interstitial prominence and hyperinflation of the lungs with no acute abnormality.  Cardiomegaly CT angiography chest with contrast showed no evidence for pulmonary embolism.  Mild cardiomegaly.  Neuroform limited left solid pulmonary nodule.  Stable right lower lobe pulmonary nodules measuring up to 3 mm. Patient was treated with IV  hydralazine, labetalol, potassium was replenished, magnesium was replenished.   Hospitalist was asked to admit patient for further evaluation and management.  EKG: Normal sinus rhythm at a rate of 88 bpm with prolonged PR interval and nonspecific T wave abnormalities   Assessment/Plan Present on Admission:  Elevated troponin  Hypertensive urgency  Essential hypertension  Hypokalemia  Chronic HFrEF (heart failure with reduced ejection fraction) (HCC)  CAD (coronary artery disease)  CKD (chronic kidney disease) stage 3, GFR 30-59 ml/min (HCC)  COPD (chronic obstructive pulmonary disease) (HCC)   Principal Problem:   Elevated troponin Active Problems:   Essential hypertension   Hypokalemia   COPD (chronic obstructive pulmonary disease) (HCC)   Chronic HFrEF (heart failure with reduced ejection fraction) (HCC)   CAD (coronary artery disease)   CKD (chronic kidney disease) stage 3, GFR 30-59 ml/min (HCC)   Hypertensive urgency   Pulmonary nodule   Elevated troponin-flattened -Denies any chest pain or shortness of breath, likely demand ischemia Troponin 87 > 96 > 119 > 107  EKG reviewed showed nonspecific T abnormalities with normal sinus rhythm and prolonged PR interval Cardiology consulted-recommending medical management with Plavix 75 mg daily, Imdur 60 mg daily, Coreg 12.5 mg twice a day and atorvastatin 80  mg daily   Cardiac Catheterization: 10/2021   Mid RCA lesion is 70% stenosed.   Acute Mrg lesion is 80% stenosed.  Hypertensive urgency-resolved Essential hypertension (uncontrolled) BP on admission 220/104, -Cardiology recommending:  Coreg 12.5 mg twice a day, Imdur 60 mg daily, Entresto 24-26 mg twice a day Discontinuing amlodipine   Hypokalemia Potassium 2.7, this was replenished Continue to monitor potassium level   Pulmonary nodule CT angiography of chest showed 3 mm RLL pulmonary nodules No routine follow-up imaging is recommended per radiologist, this does  not apply to hemocompromised patients in patients with cancer.   Chronic ischemic cardiomyopathy and chronic HFrEF Last echo reviewed, Euvolemic, Continue Coreg, Imdur   EF was at 30% in 10/2021. While his BNP is elevated to 310,   Cardiology recommending resuming Coreg, Entresto, Resuming spironolactone,, considering S GLT 2 inhibitor -Repeat echo  CAD  Continue aspirin Plavix, Lipitor   CKD stage IIIb Euvolemic, creatinine level stable   COPD Continue Ventolin, Breztri      ----------------------------------------------------------------------------------------------------------------------------------------------- Nutritional status:  The patient's BMI is: Body mass index is 21.02 kg/m. I agree with the assessment and plan as outlined below: Nutrition Status:        -------------------------------------------------------------------------------------------------------------------------  DVT prophylaxis:  enoxaparin (LOVENOX) injection 40 mg Start: 04/26/22 2200 SCDs Start: 04/26/22 0759   Code Status:   Code Status: Full Code  Family Communication: No family member present at bedside- attempt will be made to update daily The above findings and plan of care has been discussed with patient (and family)  in detail,  they expressed understanding and agreement of above. -Advance care planning has been discussed.   Admission status:   Status is: Observation The patient remains OBS appropriate and will d/c before 2 midnights.   Disposition: From  - home             Planning for discharge in 1-2 days: to   Procedures:   No admission procedures for hospital encounter.   Antimicrobials:  Anti-infectives (From admission, onward)    None        Medication:   amLODipine  5 mg Oral Daily   atorvastatin  80 mg Oral QPM   carvedilol  12.5 mg Oral BID WC   clopidogrel  75 mg Oral Daily   enoxaparin (LOVENOX) injection  40 mg Subcutaneous Q24H   isosorbide  mononitrate  60 mg Oral QPM   sacubitril-valsartan  1 tablet Oral BID    acetaminophen **OR** acetaminophen, ondansetron **OR** ondansetron (ZOFRAN) IV   Objective:   Vitals:   04/26/22 0700 04/26/22 0719 04/26/22 0800 04/26/22 0827  BP:   (!) 143/81 (!) 161/92  Pulse: 74  73 72  Resp: (!) 22  17 19   Temp:  98.4 F (36.9 C)  97.6 F (36.4 C)  TempSrc:  Oral    SpO2: 96%  96% 98%  Weight:      Height:        Intake/Output Summary (Last 24 hours) at 04/26/2022 1040 Last data filed at 04/26/2022 0900 Gross per 24 hour  Intake 240 ml  Output --  Net 240 ml   Filed Weights   04/25/22 2114  Weight: 70.3 kg     Physical examination:   Constitution:  Alert, cooperative, no distress,  Appears calm and comfortable  Psychiatric:   Normal and stable mood and affect, cognition intact,   HEENT:        Normocephalic, PERRL, otherwise with in Normal limits  Chest:  Chest symmetric Cardio vascular:  S1/S2, RRR, No murmure, No Rubs or Gallops  pulmonary: Clear to auscultation bilaterally, respirations unlabored, negative wheezes / crackles Abdomen: Soft, non-tender, non-distended, bowel sounds,no masses, no organomegaly Muscular skeletal: Limited exam - in bed, able to move all 4 extremities,   Neuro: CNII-XII intact. , normal motor and sensation, reflexes intact  Extremities: No pitting edema lower extremities, +2 pulses  Skin: Dry, warm to touch, negative for any Rashes,  Wounds: Nasal bridge healing wound, no other visible wounds Please see  nursing documentation   ------------------------------------------------------------------------------------------------------------------------------------------    LABs:     Latest Ref Rng & Units 04/25/2022    9:56 PM 04/13/2022   12:58 PM 01/30/2022    3:29 PM  CBC  WBC 4.0 - 10.5 K/uL 10.5  6.8  8.5   Hemoglobin 13.0 - 17.0 g/dL 12.7  12.9  10.9   Hematocrit 39.0 - 52.0 % 39.2  40.7  35.8   Platelets 150 - 400 K/uL 188   235  314       Latest Ref Rng & Units 04/26/2022    1:52 AM 04/25/2022    9:56 PM 04/13/2022   12:58 PM  CMP  Glucose 70 - 99 mg/dL 99  120  134   BUN 8 - 23 mg/dL 22  23  27    Creatinine 0.61 - 1.24 mg/dL 1.98  2.03  1.80   Sodium 135 - 145 mmol/L 136  136  138   Potassium 3.5 - 5.1 mmol/L 3.7  2.7  4.0   Chloride 98 - 111 mmol/L 102  103  106   CO2 22 - 32 mmol/L 21  24  24    Calcium 8.9 - 10.3 mg/dL 8.9  8.6  8.7   Total Protein 6.5 - 8.1 g/dL  6.8  7.1   Total Bilirubin 0.3 - 1.2 mg/dL  0.8  0.5   Alkaline Phos 38 - 126 U/L  168  167   AST 15 - 41 U/L  35  31   ALT 0 - 44 U/L  28  23        Micro Results Recent Results (from the past 240 hour(s))  Resp panel by RT-PCR (RSV, Flu A&B, Covid) Anterior Nasal Swab     Status: None   Collection Time: 04/25/22  9:47 PM   Specimen: Anterior Nasal Swab  Result Value Ref Range Status   SARS Coronavirus 2 by RT PCR NEGATIVE NEGATIVE Final    Comment: (NOTE) SARS-CoV-2 target nucleic acids are NOT DETECTED.  The SARS-CoV-2 RNA is generally detectable in upper respiratory specimens during the acute phase of infection. The lowest concentration of SARS-CoV-2 viral copies this assay can detect is 138 copies/mL. A negative result does not preclude SARS-Cov-2 infection and should not be used as the sole basis for treatment or other patient management decisions. A negative result may occur with  improper specimen collection/handling, submission of specimen other than nasopharyngeal swab, presence of viral mutation(s) within the areas targeted by this assay, and inadequate number of viral copies(<138 copies/mL). A negative result must be combined with clinical observations, patient history, and epidemiological information. The expected result is Negative.  Fact Sheet for Patients:  EntrepreneurPulse.com.au  Fact Sheet for Healthcare Providers:  IncredibleEmployment.be  This test is no t yet  approved or cleared by the [place] FDA and  has been authorized for detection and/or diagnosis of SARS-CoV-2 by FDA under an Emergency Use Authorization (EUA). This EUA will remain  in effect (meaning this test can be used) for the duration of the COVID-19 declaration under Section 564(b)(1) of the Act, 21 U.S.C.section 360bbb-3(b)(1), unless the authorization is terminated  or revoked sooner.       Influenza A by PCR NEGATIVE NEGATIVE Final   Influenza B by PCR NEGATIVE NEGATIVE Final    Comment: (NOTE) The Xpert Xpress SARS-CoV-2/FLU/RSV plus assay is intended as an aid in the diagnosis of influenza from Nasopharyngeal swab specimens and should not be used as a sole basis for treatment. Nasal washings and aspirates are unacceptable for Xpert Xpress SARS-CoV-2/FLU/RSV testing.  Fact Sheet for Patients: EntrepreneurPulse.com.au  Fact Sheet for Healthcare Providers: IncredibleEmployment.be  This test is not yet approved or cleared by the [place] FDA and has been authorized for detection and/or diagnosis of SARS-CoV-2 by FDA under an Emergency Use Authorization (EUA). This EUA will remain in effect (meaning this test can be used) for the duration of the COVID-19 declaration under Section 564(b)(1) of the Act, 21 U.S.C. section 360bbb-3(b)(1), unless the authorization is terminated or revoked.     Resp Syncytial Virus by PCR NEGATIVE NEGATIVE Final    Comment: (NOTE) Fact Sheet for Patients: EntrepreneurPulse.com.au  Fact Sheet for Healthcare Providers: IncredibleEmployment.be  This test is not yet approved or cleared by the [place] FDA and has been authorized for detection and/or diagnosis of SARS-CoV-2 by FDA under an Emergency Use Authorization (EUA). This EUA will remain in effect (meaning this test can be used) for the duration of the COVID-19 declaration under Section 564(b)(1) of  the Act, 21 U.S.C. section 360bbb-3(b)(1), unless the authorization is terminated or revoked.  Performed at [facility], [address], [place], [place] [address]     Radiology Reports CT Angio Chest PE W and/or Wo Contrast  Result Date: 04/25/2022 CLINICAL DATA:  Shortness of breath EXAM: CT ANGIOGRAPHY CHEST WITH CONTRAST TECHNIQUE: Multidetector CT imaging of the chest was performed using the standard protocol during bolus administration of intravenous contrast. Multiplanar CT image reconstructions and MIPs were obtained to evaluate the vascular anatomy. RADIATION DOSE REDUCTION: This exam was performed according to the departmental dose-optimization program which includes automated exposure control, adjustment of the mA and/or kV according to patient size and/or use of iterative reconstruction technique. CONTRAST:  40mL OMNIPAQUE IOHEXOL 350 MG/ML SOLN COMPARISON:  CT angiogram chest 11/13/2021.  CT abdomen 01/25/2022. FINDINGS: Cardiovascular: Heart is mildly enlarged. Aorta is normal in size. There is no pericardial effusion. There are atherosclerotic calcifications of the aorta. There is adequate opacification of the pulmonary arteries to the segmental level. There is no evidence for pulmonary embolism. Mediastinum/Nodes: There is a heterogeneous left thyroid nodule measuring up to 2.5 cm similar to prior. There are no enlarged mediastinal or hilar lymph nodes. Esophagus is nondilated. Lungs/Pleura: Mild emphysematous changes are present. There stable nodular densities in the right lower lobe measuring up to 3 mm. There is a new rounded 4 mm nodule in the superior segment of the left lower lobe image 6/43. The lungs are otherwise clear. There is no pleural effusion or pneumothorax. Upper Abdomen: Stable hypodense lesions in the liver. Stable pancreatic calcifications. Musculoskeletal: No chest wall abnormality. No acute or significant osseous findings. Review of the MIP images confirms the above  findings. IMPRESSION: 1. No evidence for pulmonary embolism. 2. Mild cardiomegaly. 3. New 4 mm left solid pulmonary nodule. Per Fleischner Society Guidelines, no routine follow-up imaging is recommended. These guidelines do not apply to immunocompromised patients and patients with cancer.  Follow up in patients with significant comorbidities as clinically warranted. For lung cancer screening, adhere to Lung-RADS guidelines. Reference: Radiology. 2017; 284(1):228-43. 4. Stable right lower lobe pulmonary nodules measuring up to 3 mm. Aortic Atherosclerosis (ICD10-I70.0). Electronically Signed   By: Ronney Asters M.D.   On: 04/25/2022 23:35   DG Chest Port 1 View  Result Date: 04/25/2022 CLINICAL DATA:  Shortness of breath. EXAM: PORTABLE CHEST 1 VIEW COMPARISON:  04/13/2022. FINDINGS: The heart is enlarged and the mediastinal contour is within normal limits. There is atherosclerotic calcification of the aorta. Hyperinflation of the lungs is noted. There is chronic prominent interstitial thickening. No consolidation, effusion, or pneumothorax. No acute osseous abnormality. IMPRESSION: 1. Chronic interstitial prominence and hyperinflation of the lungs with no acute abnormality. 2. Cardiomegaly. Electronically Signed   By: Brett Fairy M.D.   On: 04/25/2022 22:18    SIGNED: Deatra James, MD, FHM. FAAFP. [facility] - [facility] Time spent > 55 min.  In seeing, evaluating and examining the patient. Reviewing medical records, labs, drawn plan of care. [facility],  Pager (please use [email or web address] to page/ text) Please use Epic Secure Chat for non-urgent communication (7AM-7PM)  If 7PM-7AM, please contact night-coverage [email or web address], 04/26/2022, 10:40 AM

## 2022-04-26 NOTE — ED Notes (Signed)
Pt sitting on edge of bed.  

## 2022-04-26 NOTE — Hospital Course (Addendum)
Michael [name] is a 67 y.o. male with medical history significant of hypertension, hyperlipidemia, COPD, chronic normocytic anemia, head and neck cancer status post partial face and nose resection and construction, CAD, chronic HFrEF, CKD 3B   Presented emergency department due to complaint of shortness of breath.  He complained of chronic cough and subjective shortness of breath.  Patient denies chest pain, fever, chills, nausea, vomiting.  EMS was activated, and on arrival of EMS team, patient complained of difficulty breathing and that he was unable to catch his breath, however, O2 sat was 100% on room air when EMS checked.   ED Course:  BP was 140/95 on arrival to the ED, other vital signs were within normal range.  Workup in the ED showed normocytic anemia, BMP showed sodium 136, potassium 2.7, chloride 103, bicarb 24, blood glucose 120, BUN 23, creatinine 2.03, ALP 168, EGFR 35.  Troponin 87 > 96 > 119 > 107.  BNP 2,310 (chronically elevated, this was 2644 on 11/13/2021 and was > 4,500 on 11/07/2021).   Influenza A, B, SARS coronavirus 2, RSV was negative. Chest x-ray showed chronic interstitial prominence and hyperinflation of the lungs with no acute abnormality.  Cardiomegaly CT angiography chest with contrast showed no evidence for pulmonary embolism.  Mild cardiomegaly.  Neuroform limited left solid pulmonary nodule.  Stable right lower lobe pulmonary nodules measuring up to 3 mm. Patient was treated with IV hydralazine, labetalol, potassium was replenished, magnesium was replenished.   Hospitalist was asked to admit patient for further evaluation and management.  EKG: Normal sinus rhythm at a rate of 88 bpm with prolonged PR interval and nonspecific T wave abnormalities   Assessment/Plan Present on Admission:  Elevated troponin  Hypertensive urgency  Essential hypertension  Hypokalemia  Chronic HFrEF (heart failure with reduced ejection fraction) (HCC)  CAD (coronary artery disease)   CKD (chronic kidney disease) stage 3, GFR 30-59 ml/min (HCC)  COPD (chronic obstructive pulmonary disease) (HCC)   Principal Problem:   Elevated troponin Active Problems:   Essential hypertension   Hypokalemia   COPD (chronic obstructive pulmonary disease) (HCC)   Chronic HFrEF (heart failure with reduced ejection fraction) (HCC)   CAD (coronary artery disease)   CKD (chronic kidney disease) stage 3, GFR 30-59 ml/min (HCC)   Hypertensive urgency   Pulmonary nodule   Elevated troponin-flattened -Denies any chest pain or shortness of breath, likely demand ischemia Troponin 87 > 96 > 119 > 107  EKG reviewed showed nonspecific T abnormalities with normal sinus rhythm and prolonged PR interval Cardiology consulted-recommending medical management with Plavix 75 mg daily, Imdur 60 mg daily, Coreg 12.5 mg twice a day and atorvastatin 80 mg daily   Cardiac Catheterization: 10/2021   Mid RCA lesion is 70% stenosed.   Acute Mrg lesion is 80% stenosed.  Hypertensive urgency-resolved Essential hypertension (uncontrolled) BP on admission 220/104, -Cardiology recommending:  Coreg 12.5 mg twice a day, Imdur 60 mg daily, Entresto 24-26 mg twice a day Discontinuing amlodipine   Hypokalemia Potassium 2.7, this was replenished Continue to monitor potassium level   Pulmonary nodule CT angiography of chest showed 3 mm RLL pulmonary nodules No routine follow-up imaging is recommended per radiologist, this does not apply to hemocompromised patients in patients with cancer.   Chronic ischemic cardiomyopathy and chronic HFrEF Last echo reviewed, Euvolemic, Continue Coreg, Imdur   EF was at 30% in 10/2021. While his BNP is elevated to 310,   Cardiology recommending resuming Coreg, Entresto, Resuming spironolactone,, considering S GLT 2  inhibitor -Repeat echo  CAD  Continue aspirin Plavix, Lipitor   CKD stage IIIb Euvolemic, creatinine level stable   COPD Continue Ventolin, Breztri

## 2022-04-26 NOTE — Consult Note (Signed)
Consultation Note Date: 04/26/2022   Patient Name: Michael [name]  DOB: 23-Feb-1955  MRN: [id number]  Age / Sex: 67 y.o., male  PCP: [name], MD Referring Physician: Deatra James, MD  Reason for Consultation: Establishing goals of care  HPI/Patient Profile: 67 y.o. male  with past medical history of HTN/HLD, COPD, chronic normocytic anemia, head neck cancer status post partial face/nose resection and reconstruction, CAD, chronic HFrEF, CKD 3, admitted on 04/25/2022 with elevated troponin/SOB, hypertensive urgency.   Clinical Assessment and Goals of Care: I have reviewed medical records including EPIC notes, labs and imaging, received report from RN, assessed the patient.  Michael [name] is sitting quietly in bed.  He greets me, making and somewhat keeping eye contact.  He is alert and oriented x 3, able to make his needs known.  There is no family at bedside at this time.    We meet at bedside to discuss diagnosis prognosis, GOC, EOL wishes, disposition and options.  I introduced Palliative Medicine as specialized medical care for people living with serious illness. It focuses on providing relief from the symptoms and stress of a serious illness. The goal is to improve quality of life for both the patient and the family.  We discussed a brief life review of the patient.  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  He is currently living with a friend, Michael [name] and [place] mother.  He tells me that he is independent with ADLs, does not drive because he does not have a car.  Michael [name] and he go together to get groceries.  He manages his own finances.  He is unmarried, his parents are deceased.  He has 1 child, son Michael [name] who lives in [place].  We then focused on their current illness.  We talk about his chest pain and the treatment plan.  We talk about  selected labs.  Michael [name] tells me that "they think the cancer is coming back".  I ask him who has told him this, what doctors he sees, but he is unable to tell me.  He states that he does not have an appointment for follow-up. We talked about his functional status and going home versus going to rehab.  We talked about healthcare power of attorney and CODE STATUS.  The natural disease trajectory and expectations at EOL were discussed.  Advanced directives, concepts specific to code status, artifical feeding and hydration, and rehospitalization were considered and discussed.  Mr. Trela tells me that he would want attempted resuscitation.  We talk about for length of time and he tells me that he is unsure.  He does tell me that he does not think he would want a tracheotomy.  I encouraged him to think about what he does and does not want, and this may change as time goes forward.  He states agreement.  Discussed the importance of continued conversation with family and the medical providers regarding overall plan of care and  treatment options, ensuring decisions are within the context of the patient's values and GOCs.  Questions and concerns were addressed.  The patient was encouraged to call with questions or concerns.  PMT will continue to support holistically.  Conference with attending, bedside nursing staff, transition of care team related to patient condition, needs, goals of care, disposition.   HCPOA NEXT OF KIN -Michael [name] names his only child, son Michael [name], as his healthcare surrogate.  He shares that Michael [name] lives in [place]   At this point continue full scope/full code Considering setting limits on life support Anticipate home with home health if needed States he is to follow-up with doctors about head and neck cancer, unable to give date or MD.  Code Status/Advance Care Planning: Full code-we talked about the concept of "treat the  treatable, but allowing natural passing.   Mr. Mudd states he would want life support.  I asked, "for how long, 2 days, 2 weeks, 2 months?"  At this at the point he is unable to set limits, but states he does not think he would want tracheotomy.  Symptom Management:  Per hospitalist, no additional needs at this time.  Palliative Prophylaxis:  Frequent Pain Assessment and Oral Care  Additional Recommendations (Limitations, Scope, Preferences): Full Scope Treatment  Psycho-social/Spiritual:  Desire for further Chaplaincy support:no Additional Recommendations: Caregiving  Support/Resources  Prognosis:  Unable to determine, based on outcomes.  12 months or less would not be surprising based on chronic illness burden, possible return of head neck cancer.  Discharge Planning: Anticipate home with home health if needed      Primary Diagnoses: Present on Admission:  Elevated troponin  Hypertensive urgency  Essential hypertension  Hypokalemia  Chronic HFrEF (heart failure with reduced ejection fraction) (HCC)  CAD (coronary artery disease)  CKD (chronic kidney disease) stage 3, GFR 30-59 ml/min (HCC)  COPD (chronic obstructive pulmonary disease) ([place])   I have reviewed the medical record, interviewed the patient and family, and examined the patient. The following aspects are pertinent.  Past Medical History:  Diagnosis Date   Aortic atherosclerosis ([place]) 08/03/2021   CAD (coronary artery disease) 08/03/2021   Cardiac catheterization September 2023 with RCA/RV marginal stenosis managed medically   Cardiomyopathy ([place])    Carotid artery disease (HCC)    CKD (chronic kidney disease) stage 3, GFR 30-59 ml/min (HCC)    COPD (chronic obstructive pulmonary disease) (HCC)    Essential hypertension    Head and neck cancer ([place]) 2019   Right facial basal cell carcinoma s/p resection with right partial mastectomy and partal rhinectomy with skin graft (06/13/17)   Iron deficiency  anemia    Urinary retention    Social History   Socioeconomic History   Marital status: Single    Spouse name: Not on file   Number of children: Not on file   Years of education: Not on file   Highest education level: Not on file  Occupational History   Not on file  Tobacco Use   Smoking status: Former    Packs/day: 0.50    Years: 40.00    Additional pack years: 0.00    Total pack years: 20.00    Types: Cigarettes    Quit date: 07/02/2021    Years since quitting: 0.8    Passive exposure: Never   Smokeless tobacco: Never   Tobacco comments:    Quit on 06/09/21  Vaping Use   Vaping Use: Never used  Substance and Sexual Activity   Alcohol use: Not Currently   Drug use: Never   Sexual activity: Not Currently  Other Topics Concern   Not on file  Social History Narrative   Not on file   Social Determinants of Health   Financial Resource Strain: Low Risk  (12/11/2019)   Overall Financial Resource Strain (CARDIA)    Difficulty of Paying Living Expenses: Not hard at all  Food Insecurity: No Food Insecurity (10/20/2021)   Hunger Vital Sign    Worried About Running Out of Food in the Last Year: Never true    Ran Out of Food in the Last Year: Never true  Transportation Needs: No Transportation Needs (10/20/2021)   PRAPARE - Hydrologist (Medical): No    Lack of Transportation (Non-Medical): No  Physical Activity: Inactive (12/11/2019)   Exercise Vital Sign    Days of Exercise per Week: 0 days    Minutes of Exercise per Session: 0 min  Stress: No Stress Concern Present (12/11/2019)   [place]    Feeling of Stress : Not at all  Social Connections: Socially Isolated (12/11/2019)   Social Connection and Isolation Panel [NHANES]    Frequency of Communication with Friends and Family: Never    Frequency of Social Gatherings with Friends and Family: Never    Attends Religious Services:  Never    Marine scientist or Organizations: No    Attends Music therapist: Never    Marital Status: Divorced   Family History  Problem Relation Age of Onset   Stroke Father    Cirrhosis Mother    Colon cancer Neg Hx    Scheduled Meds:  amLODipine  5 mg Oral Daily   atorvastatin  80 mg Oral QPM   carvedilol  12.5 mg Oral BID WC   clopidogrel  75 mg Oral Daily   enoxaparin (LOVENOX) injection  40 mg Subcutaneous Q24H   isosorbide mononitrate  60 mg Oral QPM   umeclidinium bromide  1 puff Inhalation Daily   And   mometasone-formoterol  2 puff Inhalation BID   sacubitril-valsartan  1 tablet Oral BID   Continuous Infusions: PRN Meds:.acetaminophen **OR** acetaminophen, albuterol, ondansetron **OR** ondansetron (ZOFRAN) IV Medications Prior to Admission:  Prior to Admission medications   Medication Sig Start Date End Date Taking? Authorizing Provider  albuterol (VENTOLIN HFA) 108 (90 Base) MCG/ACT inhaler Inhale 2 puffs into the lungs every 6 (six) hours as needed for wheezing or shortness of breath. 08/14/21   Pappayliou, Barnetta Chapel A, DO  amLODipine (NORVASC) 5 MG tablet Take 5 mg by mouth daily. 10/12/21   [provider]  atorvastatin (LIPITOR) 80 MG tablet Take 1 tablet (80 mg total) by mouth every evening. 08/10/21   Swinyer, Lanice Schwab, NP  Budeson-Glycopyrrol-Formoterol (BREZTRI AEROSPHERE) 160-9-4.8 MCG/ACT AERO Inhale 2 puffs into the lungs 2 (two) times daily.    [provider]  carvedilol (COREG) 12.5 MG tablet Take 1 tablet (12.5 mg total) by mouth 2 (two) times daily with a meal. 10/26/21   Aline August, MD  clopidogrel (PLAVIX) 75 MG tablet Take 1 tablet (75 mg total) by mouth daily. Patient not taking: Reported on 04/13/2022 10/26/21   Aline August, MD  ergocalciferol (VITAMIN D2) 1.25 MG (50000 UT) capsule Take 1 capsule (50,000 Units total) by mouth once a week. Patient not taking: Reported on 04/13/2022 01/13/22   Harriett Rush, PA-C  Evolocumab (REPATHA SURECLICK) [id number] MG/ML SOAJ Inject 1 pen into the skin every 14 (fourteen) days. 03/09/21   Satira Sark, MD  furosemide (LASIX) 40 MG tablet Take 1 tablet (40 mg total) by mouth daily. Patient not taking: Reported on 04/13/2022 10/26/21 12/15/22  Aline August, MD  HYDROcodone-acetaminophen (NORCO) 5-325 MG tablet Take 2 tablets by mouth every 6 (six) hours as needed for moderate pain. Patient not taking: Reported on 04/13/2022 01/30/22   Fransico Meadow, PA-C  HYDROcodone-acetaminophen (NORCO/VICODIN) 5-325 MG tablet Take 1 tablet by mouth every 4 (four) hours as needed for moderate pain. Patient not taking: Reported on 04/13/2022 01/30/22 01/30/23  Fransico Meadow, PA-C  isosorbide mononitrate (IMDUR) 60 MG 24 hr tablet Take 1 tablet (60 mg total) by mouth every evening. 11/09/21   Florencia Reasons, MD  potassium chloride SA (KLOR-CON M) 20 MEQ tablet Take 1 tablet (20 mEq total) by mouth 2 (two) times daily. 10/26/21   Aline August, MD  sacubitril-valsartan (ENTRESTO) 24-26 MG Take 1 tablet by mouth 2 (two) times daily. Patient not taking: Reported on 04/13/2022 12/14/21   Satira Sark, MD   No Known Allergies Review of Systems  Unable to perform ROS: Other    Physical Exam Vitals and nursing note reviewed.  Constitutional:      General: He is not in acute distress.    Appearance: He is normal weight. He is ill-appearing.  HENT:     Head:     Comments: Healed face/nose reconstruction, scabbing on bridge of nose Cardiovascular:     Rate and Rhythm: Normal rate.  Pulmonary:     Effort: Pulmonary effort is normal. No tachypnea.  Skin:    General: Skin is warm and dry.  Neurological:     Mental Status: He is alert and oriented to person, place, and time.  Psychiatric:        Mood and Affect: Mood normal.        Behavior: Behavior normal.     Vital Signs: BP 132/70 (BP Location: Left Arm)   Pulse 76   Temp 97.9 F (36.6 C) (Oral)   Resp 19   Ht 6'  (1.829 m)   Wt 70.3 kg   SpO2 99%   BMI 21.02 kg/m  Pain Scale: 0-10   Pain Score: 0-No pain   SpO2: SpO2: 99 % O2 Device:SpO2: 99 % O2 Flow Rate: .   IO: Intake/output summary:  Intake/Output Summary (Last 24 hours) at 04/26/2022 1400 Last data filed at 04/26/2022 1300 Gross per 24 hour  Intake 480 ml  Output --  Net 480 ml    LBM: Last BM Date : 04/25/22 Baseline Weight: Weight: 70.3 kg Most recent weight: Weight: 70.3 kg     Palliative Assessment/Data:     Time In: 1100 Time Out: 1155 Time Total: 55 minutes  Greater than 50%  of this time was spent counseling and coordinating care related to the above assessment and plan.  Signed by: Drue Novel, NP   Please contact Palliative Medicine Team phone at [phone] for questions and concerns.  For individual provider: See Shea Evans

## 2022-04-26 NOTE — ED Provider Notes (Signed)
Care assumed from Dr. Sharlett Iles, patient being prepared for admission, waiting for control of blood pressure.  He has had rising troponins but only mild elevation.  And the ECG was repeated showing significant increase in heart rate and concerning for hyperacute T wave changes.  ECG was repeated again, and heart rate has come down and T wave changes are no longer present.  I suspect that the tachycardia may have been responsible for the T wave changes.  He is significantly hypokalemic and had received oral potassium, I have ordered IV potassium.  Magnesium is in the lower end of normal, I have ordered IV magnesium.  Third troponin continues to rise slowly.  Blood pressure has come down to a safe range.  I have discussed the ED course with Dr. Josephine Cables of [facility] hospitalists, who is now agreeing to admit the patient.  CRITICAL CARE Performed by: Delora Fuel Total critical care time: 50 minutes Critical care time was exclusive of separately billable procedures and treating other patients. Critical care was necessary to treat or prevent imminent or life-threatening deterioration. Critical care was time spent personally by me on the following activities: development of treatment plan with patient and/or surrogate as well as nursing, discussions with consultants, evaluation of patient's response to treatment, examination of patient, obtaining history from patient or surrogate, ordering and performing treatments and interventions, ordering and review of laboratory studies, ordering and review of radiographic studies, pulse oximetry and re-evaluation of patient's condition.   Delora Fuel, MD [id number] [phone]

## 2022-04-26 NOTE — Progress Notes (Signed)
  Transition of Care [facility]) Screening Note   Patient Details  Name: Michael [name] Date of Birth: August 06, 1955   Transition of Care [facility]) CM/SW Contact:    Iona Beard, [place] Phone Number: 04/26/2022, 11:21 AM  TOC consulted for SNF placement. Chart review shows that pt is from home and independent at baseline. PT is recommending no PT follow up.  Transition of Care Department [facility]) has reviewed patient and no TOC needs have been identified at this time. We will continue to monitor patient advancement through interdisciplinary progression rounds. If new patient transition needs arise, please place a TOC consult.

## 2022-04-26 NOTE — Evaluation (Signed)
Physical Therapy Evaluation Patient Details Name: Michael [name] MRN: [id number] DOB: 1955/07/22 Today's Date: 04/26/2022  History of Present Illness  Michael [name] is a 67 y.o. male with medical history significant of hypertension, hyperlipidemia, COPD, chronic normocytic anemia, 8 and neck cancer status post partial face and nose resection and construction, CAD, chronic HFrEF, CKD 3B who presents emergency department due to complaint of shortness of breath.  He complained of chronic cough and subjective shortness of breath.  Patient denies chest pain, fever, chills, nausea, vomiting.  EMS was activated, and on arrival of EMS team, patient complained of difficulty breathing and that he was unable to catch his breath, however, O2 sat was 100% on room air when EMS checked.   Clinical Impression  Patient functioning near baseline for functional mobility and gait demonstrating good return for bed mobility, transfers and ambulating in room/hallway without loss of balance.  Plan:  Patient discharged from physical therapy to care of nursing for ambulation daily as tolerated for length of stay.         Recommendations for follow up therapy are one component of a multi-disciplinary discharge planning process, led by the attending physician.  Recommendations may be updated based on patient status, additional functional criteria and insurance authorization.  Follow Up Recommendations       Assistance Recommended at Discharge PRN  Patient can return home with the following  Other (comment) (near baseline)    Equipment Recommendations None recommended by PT  Recommendations for Other Services       Functional Status Assessment Patient has not had a recent decline in their functional status     Precautions / Restrictions Precautions Precautions: None Restrictions Weight Bearing Restrictions: No      Mobility  Bed Mobility Overal bed mobility: Modified Independent                   Transfers Overall transfer level: Modified independent                      Ambulation/Gait Ambulation/Gait assistance: Modified independent (Device/Increase time) Gait Distance (Feet): 120 Feet Assistive device: None Gait Pattern/deviations: WFL(Within Functional Limits) Gait velocity: decreased     General Gait Details: grossly WFL with slightly labored movement demonstrating good return for ambulating in room/hallway without loss of balance  Stairs            Wheelchair Mobility    Modified Rankin (Stroke Patients Only)       Balance Overall balance assessment: No apparent balance deficits (not formally assessed)                                           Pertinent Vitals/Pain Pain Assessment Pain Assessment: No/denies pain    Home Living Family/patient expects to be discharged to:: Private residence Living Arrangements: Alone Available Help at Discharge: Friend(s);Available PRN/intermittently Type of Home: House Home Access: Ramped entrance       Home Layout: One level Home Equipment: None Additional Comments: landlord lives next door, two friends also live next door    Prior Function Prior Level of Function : Independent/Modified Independent             Mobility Comments: Household and short distanced community ambulator without use of an AD, does not drive ADLs Comments: Independent     Hand Dominance   Dominant Hand: Right  Extremity/Trunk Assessment   Upper Extremity Assessment Upper Extremity Assessment: Defer to OT evaluation    Lower Extremity Assessment Lower Extremity Assessment: Overall WFL for tasks assessed    Cervical / Trunk Assessment Cervical / Trunk Assessment: Normal  Communication   Communication: No difficulties  Cognition Arousal/Alertness: Awake/alert Behavior During Therapy: WFL for tasks assessed/performed Overall Cognitive Status: Within Functional Limits for tasks assessed                                           General Comments      Exercises     Assessment/Plan    PT Assessment Patient does not need any further PT services  PT Problem List         PT Treatment Interventions      PT Goals (Current goals can be found in the Care Plan section)  Acute Rehab PT Goals Patient Stated Goal: return home with friends to assist as needed PT Goal Formulation: With patient Time For Goal Achievement: 04/26/22 Potential to Achieve Goals: Good    Frequency       Co-evaluation               AM-PAC PT "6 Clicks" Mobility  Outcome Measure Help needed turning from your back to your side while in a flat bed without using bedrails?: None Help needed moving from lying on your back to sitting on the side of a flat bed without using bedrails?: None Help needed moving to and from a bed to a chair (including a wheelchair)?: None Help needed standing up from a chair using your arms (e.g., wheelchair or bedside chair)?: None Help needed to walk in hospital room?: None Help needed climbing 3-5 steps with a railing? : A Little 6 Click Score: 23    End of Session   Activity Tolerance: Patient tolerated treatment well Patient left: in bed Nurse Communication: Mobility status PT Visit Diagnosis: Unsteadiness on feet (R26.81);Other abnormalities of gait and mobility (R26.89);Muscle weakness (generalized) (M62.81)    Time: [id number] PT Time Calculation (min) (ACUTE ONLY): 10 min   Charges:   PT Evaluation $PT Eval Low Complexity: 1 Low PT Treatments $Therapeutic Activity: 8-22 mins        2:10 PM, 04/26/22 Lonell Grandchild, MPT Physical Therapist with [facility] [phone] office [phone] mobile phone

## 2022-04-26 NOTE — Progress Notes (Signed)
Patient arrived to the unit, vitals stable. Oriented to room and placed patient on telemetry. Not currently short of breath, but states he gets short of breath when he goes to do things.

## 2022-04-26 NOTE — H&P (Signed)
History and Physical    Patient: Michael [name] [id number] DOB: 01-07-1956 DOA: 04/25/2022 DOS: the patient was seen and examined on 04/26/2022 PCP: [name], MD  Patient coming from: Home  Chief Complaint:  Chief Complaint  Patient presents with   Shortness of Breath   HPI: Michael [name] is a 67 y.o. male with medical history significant of hypertension, hyperlipidemia, COPD, chronic normocytic anemia, 8 and neck cancer status post partial face and nose resection and construction, CAD, chronic HFrEF, CKD 3B who presents emergency department due to complaint of shortness of breath.  He complained of chronic cough and subjective shortness of breath.  Patient denies chest pain, fever, chills, nausea, vomiting.  EMS was activated, and on arrival of EMS team, patient complained of difficulty breathing and that he was unable to catch his breath, however, O2 sat was 100% on room air when EMS checked.  ED Course:  In the emergency department, BP was 140/95 on arrival to the ED, other vital signs were within normal range.  Workup in the ED showed normocytic anemia, BMP showed sodium 136, potassium 2.7, chloride 103, bicarb 24, blood glucose 120, BUN 23, creatinine 2.03, ALP 168, EGFR 35.  Troponin 87 > 96 > 119 > 107.  BNP 2,310 (chronically elevated, this was 2644 on 11/13/2021 and was > 4,500 on 11/07/2021).  Influenza A, B, SARS coronavirus 2, RSV was negative. Chest x-ray showed chronic interstitial prominence and hyperinflation of the lungs with no acute abnormality.  Cardiomegaly CT angiography chest with contrast showed no evidence for pulmonary embolism.  Mild cardiomegaly.  Neuroform limited left solid pulmonary nodule.  Stable right lower lobe pulmonary nodules measuring up to 3 mm. Patient was treated with IV hydralazine, labetalol, potassium was replenished, magnesium was replenished.  Hospitalist was asked to admit patient for further evaluation and management.  Review of  Systems: Review of systems as noted in the HPI. All other systems reviewed and are negative.   Past Medical History:  Diagnosis Date   Aortic atherosclerosis ([place]) 08/03/2021   CAD (coronary artery disease) 08/03/2021   Cardiac catheterization September 2023 with RCA/RV marginal stenosis managed medically   Cardiomyopathy ([place])    Carotid artery disease ([place])    CKD (chronic kidney disease) stage 3, GFR 30-59 ml/min (HCC)    COPD (chronic obstructive pulmonary disease) (HCC)    Essential hypertension    Head and neck cancer ([place]) 2019   Right facial basal cell carcinoma s/p resection with right partial mastectomy and partal rhinectomy with skin graft (06/13/17)   Iron deficiency anemia    Urinary retention    Past Surgical History:  Procedure Laterality Date   ANKLE CLOSED REDUCTION Right    open reduction   BASAL CELL CARCINOMA EXCISION  2019   at [facility]   BIOPSY  07/19/2021   Procedure: BIOPSY;  Surgeon: Eloise Harman, DO;  [facility];  Service: Endoscopy;;   COLONOSCOPY N/A 05/31/2017   Procedure: COLONOSCOPY;  Surgeon: Daneil Dolin, MD;  [facility];  Service: Endoscopy;  Laterality: N/A;  2:45pm   COLONOSCOPY WITH PROPOFOL N/A 07/19/2021   Procedure: COLONOSCOPY WITH PROPOFOL;  Surgeon: Eloise Harman, DO;  [facility];  Service: Endoscopy;  Laterality: N/A;  3:00pm, moved up to 9:00   CYSTOSCOPY N/A 10/29/2018   Procedure: CYSTOSCOPY, CLOT EVACUATION;  Surgeon: Ceasar Mons, MD;  Location: [facility];  Service: Urology;  Laterality: N/A;   PARTIAL COLECTOMY Right 08/11/2021  Procedure: PARTIAL COLECTOMY, OPEN RIGHT HEMICOLECTOMY;  Surgeon: Virl Cagey, MD;  Location: [facility];  Service: General;  Laterality: Right;   POLYPECTOMY  05/31/2017   Procedure: POLYPECTOMY;  Surgeon: Daneil Dolin, MD;  [facility];  Service: Endoscopy;;   RIGHT/LEFT HEART CATH AND CORONARY ANGIOGRAPHY N/A 10/22/2021   Procedure:  RIGHT/LEFT HEART CATH AND CORONARY ANGIOGRAPHY;  Surgeon: Early Osmond, MD;  Location: [facility];  Service: Cardiovascular;  Laterality: N/A;   TONSILLECTOMY     TRANSCAROTID ARTERY REVASCULARIZATION  Right 02/15/2021   Procedure: RIGHT TRANSCAROTID ARTERY REVASCULARIZATION;  Surgeon: [name] Heck, MD;  Location: [facility];  Service: Vascular;  Laterality: Right;   ULTRASOUND GUIDANCE FOR VASCULAR ACCESS Left 02/15/2021   Procedure: ULTRASOUND GUIDANCE FOR VASCULAR ACCESS;  Surgeon: [name] Heck, MD;  Location: [facility];  Service: Vascular;  Laterality: Left;   XI ROBOTIC ASSISTED SIMPLE PROSTATECTOMY N/A 10/29/2018   Procedure: XI ROBOTIC ASSISTED SIMPLE PROSTATECTOMY;  Surgeon: Cleon Gustin, MD;  [facility];  Service: Urology;  Laterality: N/A;    Social History:  reports that he quit smoking about 9 months ago. His smoking use included cigarettes. He has a 20.00 pack-year smoking history. He has never been exposed to tobacco smoke. He has never used smokeless tobacco. He reports that he does not currently use alcohol. He reports that he does not use drugs.   No Known Allergies  Family History  Problem Relation Age of Onset   Stroke Father    Cirrhosis Mother    Colon cancer Neg Hx      Prior to Admission medications   Medication Sig Start Date End Date Taking? Authorizing Provider  albuterol (VENTOLIN HFA) 108 (90 Base) MCG/ACT inhaler Inhale 2 puffs into the lungs every 6 (six) hours as needed for wheezing or shortness of breath. 08/14/21   Pappayliou, Barnetta Chapel A, DO  amLODipine (NORVASC) 5 MG tablet Take 5 mg by mouth daily. 10/12/21   [provider]  atorvastatin (LIPITOR) 80 MG tablet Take 1 tablet (80 mg total) by mouth every evening. 08/10/21   Swinyer, Lanice Schwab, NP  Budeson-Glycopyrrol-Formoterol (BREZTRI AEROSPHERE) 160-9-4.8 MCG/ACT AERO Inhale 2 puffs into the lungs 2 (two) times daily.    [provider]  carvedilol (COREG)  12.5 MG tablet Take 1 tablet (12.5 mg total) by mouth 2 (two) times daily with a meal. 10/26/21   Aline August, MD  clopidogrel (PLAVIX) 75 MG tablet Take 1 tablet (75 mg total) by mouth daily. Patient not taking: Reported on 04/13/2022 10/26/21   Aline August, MD  ergocalciferol (VITAMIN D2) 1.25 MG (50000 UT) capsule Take 1 capsule (50,000 Units total) by mouth once a week. Patient not taking: Reported on 04/13/2022 01/13/22   Harriett Rush, PA-C  Evolocumab (REPATHA SURECLICK) [id number] MG/ML SOAJ Inject 1 pen into the skin every 14 (fourteen) days. 03/09/21   Satira Sark, MD  furosemide (LASIX) 40 MG tablet Take 1 tablet (40 mg total) by mouth daily. Patient not taking: Reported on 04/13/2022 10/26/21 12/15/22  Aline August, MD  HYDROcodone-acetaminophen (NORCO) 5-325 MG tablet Take 2 tablets by mouth every 6 (six) hours as needed for moderate pain. Patient not taking: Reported on 04/13/2022 01/30/22   Fransico Meadow, PA-C  HYDROcodone-acetaminophen (NORCO/VICODIN) 5-325 MG tablet Take 1 tablet by mouth every 4 (four) hours as needed for moderate pain. Patient not taking: Reported on 04/13/2022 01/30/22 01/30/23  Fransico Meadow, PA-C  isosorbide mononitrate (IMDUR)  60 MG 24 hr tablet Take 1 tablet (60 mg total) by mouth every evening. 11/09/21   Florencia Reasons, MD  potassium chloride SA (KLOR-CON M) 20 MEQ tablet Take 1 tablet (20 mEq total) by mouth 2 (two) times daily. 10/26/21   Aline August, MD  sacubitril-valsartan (ENTRESTO) 24-26 MG Take 1 tablet by mouth 2 (two) times daily. Patient not taking: Reported on 04/13/2022 12/14/21   Satira Sark, MD    Physical Exam: BP (!) 143/81   Pulse 73   Temp 98.4 F (36.9 C) (Oral)   Resp 17   Ht 6' (1.829 m)   Wt 70.3 kg   SpO2 96%   BMI 21.02 kg/m   General: 67 y.o. year-old male well developed well nourished in no acute distress.  Alert and oriented x3. HEENT: NCAT, EOMI Neck: Supple, trachea medial Cardiovascular: Regular  rate and rhythm with no rubs or gallops.  No thyromegaly or JVD noted.  No lower extremity edema. 2/4 pulses in all 4 extremities. Respiratory: Clear to auscultation with no wheezes or rales. Good inspiratory effort. Abdomen: Soft, nontender nondistended with normal bowel sounds x4 quadrants. Muskuloskeletal: No cyanosis, clubbing or edema noted bilaterally Neuro: CN II-XII intact, strength 5/5 x 4, sensation, reflexes intact Skin: No ulcerative lesions noted or rashes Psychiatry: Judgement and insight appear normal. Mood is appropriate for condition and setting          Labs on Admission:  Basic Metabolic Panel: Recent Labs  Lab 04/25/22 2156  NA 136  K 2.7*  CL 103  CO2 24  GLUCOSE 120*  BUN 23  CREATININE 2.03*  CALCIUM 8.6*  MG 1.8   Liver Function Tests: Recent Labs  Lab 04/25/22 2156  AST 35  ALT 28  ALKPHOS 168*  BILITOT 0.8  PROT 6.8  ALBUMIN 3.5   No results for input(s): "LIPASE", "AMYLASE" in the last 168 hours. No results for input(s): "AMMONIA" in the last 168 hours. CBC: Recent Labs  Lab 04/25/22 2156  WBC 10.5  HGB 12.7*  HCT 39.2  MCV 86.7  PLT 188   Cardiac Enzymes: No results for input(s): "CKTOTAL", "CKMB", "CKMBINDEX", "TROPONINI" in the last 168 hours.  BNP (last 3 results) Recent Labs    11/07/21 1206 11/13/21 1215 04/25/22 2156  BNP >4,500.0* 2,644.0* 2,310.0*    ProBNP (last 3 results) No results for input(s): "PROBNP" in the last 8760 hours.  CBG: No results for input(s): "GLUCAP" in the last 168 hours.  Radiological Exams on Admission: CT Angio Chest PE W and/or Wo Contrast  Result Date: 04/25/2022 CLINICAL DATA:  Shortness of breath EXAM: CT ANGIOGRAPHY CHEST WITH CONTRAST TECHNIQUE: Multidetector CT imaging of the chest was performed using the standard protocol during bolus administration of intravenous contrast. Multiplanar CT image reconstructions and MIPs were obtained to evaluate the vascular anatomy. RADIATION DOSE  REDUCTION: This exam was performed according to the departmental dose-optimization program which includes automated exposure control, adjustment of the mA and/or kV according to patient size and/or use of iterative reconstruction technique. CONTRAST:  62mL OMNIPAQUE IOHEXOL 350 MG/ML SOLN COMPARISON:  CT angiogram chest 11/13/2021.  CT abdomen 01/25/2022. FINDINGS: Cardiovascular: Heart is mildly enlarged. Aorta is normal in size. There is no pericardial effusion. There are atherosclerotic calcifications of the aorta. There is adequate opacification of the pulmonary arteries to the segmental level. There is no evidence for pulmonary embolism. Mediastinum/Nodes: There is a heterogeneous left thyroid nodule measuring up to 2.5 cm similar to prior. There are no  enlarged mediastinal or hilar lymph nodes. Esophagus is nondilated. Lungs/Pleura: Mild emphysematous changes are present. There stable nodular densities in the right lower lobe measuring up to 3 mm. There is a new rounded 4 mm nodule in the superior segment of the left lower lobe image 6/43. The lungs are otherwise clear. There is no pleural effusion or pneumothorax. Upper Abdomen: Stable hypodense lesions in the liver. Stable pancreatic calcifications. Musculoskeletal: No chest wall abnormality. No acute or significant osseous findings. Review of the MIP images confirms the above findings. IMPRESSION: 1. No evidence for pulmonary embolism. 2. Mild cardiomegaly. 3. New 4 mm left solid pulmonary nodule. Per Fleischner Society Guidelines, no routine follow-up imaging is recommended. These guidelines do not apply to immunocompromised patients and patients with cancer. Follow up in patients with significant comorbidities as clinically warranted. For lung cancer screening, adhere to Lung-RADS guidelines. Reference: Radiology. 2017; 284(1):228-43. 4. Stable right lower lobe pulmonary nodules measuring up to 3 mm. Aortic Atherosclerosis (ICD10-I70.0). Electronically  Signed   By: Ronney Asters M.D.   On: 04/25/2022 23:35   DG Chest Port 1 View  Result Date: 04/25/2022 CLINICAL DATA:  Shortness of breath. EXAM: PORTABLE CHEST 1 VIEW COMPARISON:  04/13/2022. FINDINGS: The heart is enlarged and the mediastinal contour is within normal limits. There is atherosclerotic calcification of the aorta. Hyperinflation of the lungs is noted. There is chronic prominent interstitial thickening. No consolidation, effusion, or pneumothorax. No acute osseous abnormality. IMPRESSION: 1. Chronic interstitial prominence and hyperinflation of the lungs with no acute abnormality. 2. Cardiomegaly. Electronically Signed   By: Brett Fairy M.D.   On: 04/25/2022 22:18    EKG: I independently viewed the EKG done and my findings are as followed: Normal sinus rhythm at a rate of 88 bpm with prolonged PR interval and nonspecific T wave abnormalities  Assessment/Plan Present on Admission:  Elevated troponin  Hypertensive urgency  Essential hypertension  Hypokalemia  Chronic HFrEF (heart failure with reduced ejection fraction) (HCC)  CAD (coronary artery disease)  CKD (chronic kidney disease) stage 3, GFR 30-59 ml/min (HCC)  COPD (chronic obstructive pulmonary disease) (HCC)  Principal Problem:   Elevated troponin Active Problems:   Essential hypertension   Hypokalemia   COPD (chronic obstructive pulmonary disease) (HCC)   Chronic HFrEF (heart failure with reduced ejection fraction) (HCC)   CAD (coronary artery disease)   CKD (chronic kidney disease) stage 3, GFR 30-59 ml/min (HCC)   Hypertensive urgency   Pulmonary nodule  Elevated troponin-flattened Troponin 87 > 96 > 119 > 107 He denies chest pain, (? Troponin leakage) EKG personally reviewed showed nonspecific T abnormalities with normal sinus rhythm and prolonged PR interval Cardiology consulted for recommendation prior to discharge  Hypertensive urgency-resolved Essential hypertension (uncontrolled) Continue  amlodipine, Coreg  Hypokalemia Potassium 2.7, this was replenished Continue to monitor potassium level  Pulmonary nodule CT angiography of chest showed 3 mm RLL pulmonary nodules No routine follow-up imaging is recommended per radiologist, this does not apply to hemocompromised patients in patients with cancer.  Chronic ischemic cardiomyopathy and chronic HFrEF Euvolemic, Continue Coreg, Imdur    CAD  Continue aspirin Plavix, Lipitor   CKD stage IIIb Euvolemic, creatinine level stable  COPD Continue Ventolin, Breztri    DVT prophylaxis: Lovenox  Code Status: Full code  Consults: Cardiology  Family Communication: None at bedside  Severity of Illness: The appropriate patient status for this patient is OBSERVATION. Observation status is judged to be reasonable and necessary in order to provide the required intensity  of service to ensure the patient's safety. The patient's presenting symptoms, physical exam findings, and initial radiographic and laboratory data in the context of their medical condition is felt to place them at decreased risk for further clinical deterioration. Furthermore, it is anticipated that the patient will be medically stable for discharge from the hospital within 2 midnights of admission.   Author: Bernadette Hoit, DO 04/26/2022 8:08 AM  For on call review [email or web address].

## 2022-04-26 NOTE — TOC Initial Note (Signed)
Transition of Care [facility]) - Initial/Assessment Note    Patient Details  Name: Michael [name] MRN: [id number] Date of Birth: [birth year]  [facility] CM/SW Contact:    Iona Beard, [place] Phone Number: 04/26/2022, 11:47 AM  Clinical Narrative:                 Pt flipped from observation to inpatient. Pt is high risk for hospital readmission. CSW completed assessment with pt. Pt states that he lives with neighbors. Pt has had HH in the past but not currently. Pt does not use any DME in the home. TOC to follow.   Expected Discharge Plan: Home/Self Care Barriers to Discharge: Continued Medical Work up   Patient Goals and CMS Choice Patient states their goals for this hospitalization and ongoing recovery are:: return home CMS Medicare.gov Compare Post Acute Care list provided to:: Patient Choice offered to / list presented to : Patient      Expected Discharge Plan and Services In-house Referral: Clinical Social Work Discharge Planning Services: CM Consult   Living arrangements for the past 2 months: Single Family Home                                      Prior Living Arrangements/Services Living arrangements for the past 2 months: Single Family Home Lives with:: Roommate Patient language and need for interpreter reviewed:: Yes Do you feel safe going back to the place where you live?: Yes      Need for Family Participation in Patient Care: Yes (Comment) Care giver support system in place?: Yes (comment)   Criminal Activity/Legal Involvement Pertinent to Current Situation/Hospitalization: No - Comment as needed  Activities of Daily Living      Permission Sought/Granted                  Emotional Assessment Appearance:: Appears stated age Attitude/Demeanor/Rapport: Engaged Affect (typically observed): Accepting Orientation: : Oriented to Self, Oriented to Place, Oriented to  Time, Oriented to Situation Alcohol / Substance Use: Not  Applicable Psych Involvement: No (comment)  Admission diagnosis:  Hypokalemia [E87.6] SOB (shortness of breath) [R06.02] Elevated troponin [R79.89] Secondary hypertension [I15.9] Patient Active Problem List   Diagnosis Date Noted   Pulmonary nodule 04/26/2022   Lightheadedness    Angina at rest    Hypertensive urgency    Transaminitis 10/23/2021   Pressure injury of skin 10/22/2021   Type 2 MI (myocardial infarction) ([place]) 10/20/2021   Dilation of pancreatic duct 08/11/2021   Colonic mass 08/06/2021   Preoperative cardiovascular examination 08/03/2021   CAD (coronary artery disease) 08/03/2021   CKD (chronic kidney disease) stage 3, GFR 30-59 ml/min (HCC) 08/03/2021   Aortic atherosclerosis ([place]) 08/03/2021   Rectal bleeding 06/16/2021   H/O adenomatous polyp of colon 06/16/2021   Carotid stenosis, asymptomatic, right 02/15/2021   Basal cell carcinoma of [place], right 12/06/2019   Incisional hernia, without obstruction or gangrene 11/15/2019   Elevated PSA 06/19/2019   Benign prostatic hyperplasia with urinary obstruction 10/29/2018   CAP (community acquired pneumonia) 05/09/2018   Chronic HFrEF (heart failure with reduced ejection fraction) ([place]) 05/09/2018   Lobar pneumonia ([place]) 05/09/2018   Urinary retention 11/27/2017   Abnormal nuclear stress test 0000000   Complicated UTI (urinary tract infection)    Sepsis ([place]) 09/17/2017   Vasovagal syncope 09/17/2017   Sepsis secondary to UTI ([place]) 09/01/2017   Syncope 07/05/2017  COPD (chronic obstructive pulmonary disease) ([place]) 07/05/2017   Acute renal failure superimposed on chronic kidney disease ([place]) 07/05/2017   Catheter-associated urinary tract infection ([place]) 07/05/2017   Leukocytosis 07/05/2017   Urinary retention with incomplete bladder emptying 07/05/2017   UTI (urinary tract infection) 07/05/2017   Diarrhea 07/05/2017   Carotid artery disease ([place]) 06/25/2017   AKI (acute kidney injury) ([place])     Orthostatic syncope    Syncope due to orthostatic hypotension 06/24/2017   Heart murmur 06/24/2017   Abnormal EKG 06/24/2017   Abnormal PET scan of colon 05/25/2017   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 [place] [address] Phone: [phone] Fax: [phone]  [facility] [address] [place] [address] Phone: [phone] Fax: [phone]     Social Determinants of Health (SDOH) Social History: [place]: No Food Insecurity (10/20/2021)  Housing: Low Risk  (10/20/2021)  Transportation Needs: No Transportation Needs (10/20/2021)  Utilities: Not At Risk (10/20/2021)  Alcohol Screen: Low Risk  (12/11/2019)  Depression (PHQ2-9): Low Risk  (12/11/2019)  Financial Resource Strain: Low Risk  (12/11/2019)  Physical Activity: Inactive (12/11/2019)  Social Connections: Socially Isolated (12/11/2019)  Stress: No Stress Concern Present (12/11/2019)  Tobacco Use: Medium Risk (04/13/2022)   SDOH Interventions:     Readmission Risk Interventions    04/26/2022   11:46 AM 10/26/2021    2:57 PM 02/18/2021   11:33 AM  Readmission Risk Prevention Plan  Transportation Screening Complete Complete Complete  PCP or Specialist Appt within 5-7 Days   Complete  PCP or Specialist Appt within 3-5 Days  Complete   Home Care Screening   Complete  Medication Review (RN CM)   Complete  HRI or Home Care Consult  Complete Complete   Social Work Consult for [place] Planning/Counseling Complete Complete   Palliative Care Screening Not Applicable Not Applicable   Medication Review Press photographer) Complete Complete

## 2022-04-26 NOTE — ED Provider Notes (Signed)
[facility] Provider Note   CSN: [id number] Arrival date & time: 04/25/22  2106     History  Chief Complaint  Patient presents with   Shortness of Breath    Michael [name] is a 67 y.o. male.  67 year old male with a history of CAD medically managed, heart failure with reduced EF, CKD, HTN, HLD, COPD, and head and neck cancer status post partial face and nose reconstruction who presented to the emergency department shortness of breath.  Patient reports that tonight he felt that he was very short of breath.  Denies any chest pain.  Has a chronic cough without any significant changes recently.  No fevers.  Denies any history of similar events.  Not on blood thinners and no history of DVT/PE.  Denies any blood in the stool.       Home Medications Prior to Admission medications   Medication Sig Start Date End Date Taking? Authorizing Provider  albuterol (VENTOLIN HFA) 108 (90 Base) MCG/ACT inhaler Inhale 2 puffs into the lungs every 6 (six) hours as needed for wheezing or shortness of breath. 08/14/21   Pappayliou, Barnetta Chapel A, DO  amLODipine (NORVASC) 5 MG tablet Take 5 mg by mouth daily. 10/12/21   [provider]  atorvastatin (LIPITOR) 80 MG tablet Take 1 tablet (80 mg total) by mouth every evening. 08/10/21   Swinyer, Lanice Schwab, NP  Budeson-Glycopyrrol-Formoterol (BREZTRI AEROSPHERE) 160-9-4.8 MCG/ACT AERO Inhale 2 puffs into the lungs 2 (two) times daily.    [provider]  carvedilol (COREG) 12.5 MG tablet Take 1 tablet (12.5 mg total) by mouth 2 (two) times daily with a meal. 10/26/21   Aline August, MD  clopidogrel (PLAVIX) 75 MG tablet Take 1 tablet (75 mg total) by mouth daily. Patient not taking: Reported on 04/13/2022 10/26/21   Aline August, MD  ergocalciferol (VITAMIN D2) 1.25 MG (50000 UT) capsule Take 1 capsule (50,000 Units total) by mouth once a week. Patient not taking: Reported on 04/13/2022 01/13/22    Harriett Rush, PA-C  Evolocumab (REPATHA SURECLICK) [id number] MG/ML SOAJ Inject 1 pen into the skin every 14 (fourteen) days. 03/09/21   Satira Sark, MD  furosemide (LASIX) 40 MG tablet Take 1 tablet (40 mg total) by mouth daily. Patient not taking: Reported on 04/13/2022 10/26/21 12/15/22  Aline August, MD  HYDROcodone-acetaminophen (NORCO) 5-325 MG tablet Take 2 tablets by mouth every 6 (six) hours as needed for moderate pain. Patient not taking: Reported on 04/13/2022 01/30/22   Fransico Meadow, PA-C  HYDROcodone-acetaminophen (NORCO/VICODIN) 5-325 MG tablet Take 1 tablet by mouth every 4 (four) hours as needed for moderate pain. Patient not taking: Reported on 04/13/2022 01/30/22 01/30/23  Fransico Meadow, PA-C  isosorbide mononitrate (IMDUR) 60 MG 24 hr tablet Take 1 tablet (60 mg total) by mouth every evening. 11/09/21   Florencia Reasons, MD  potassium chloride SA (KLOR-CON M) 20 MEQ tablet Take 1 tablet (20 mEq total) by mouth 2 (two) times daily. 10/26/21   Aline August, MD  sacubitril-valsartan (ENTRESTO) 24-26 MG Take 1 tablet by mouth 2 (two) times daily. Patient not taking: Reported on 04/13/2022 12/14/21   Satira Sark, MD      Allergies    Patient has no known allergies.    Review of Systems   Review of Systems  Physical Exam Updated Vital Signs BP (!) 172/91   Pulse 84   Temp 98.4 F (36.9 C) (Oral)   Resp (!) 33  Ht 6' (1.829 m)   Wt 70.3 kg   SpO2 91%   BMI 21.02 kg/m  Physical Exam Vitals and nursing note reviewed.  Constitutional:      General: He is not in acute distress.    Appearance: He is well-developed.  HENT:     Head: Atraumatic.     Comments: Nose reconstruction    Right Ear: External ear normal.     Left Ear: External ear normal.     Nose: Nose normal.  Eyes:     Extraocular Movements: Extraocular movements intact.     Conjunctiva/sclera: Conjunctivae normal.     Pupils: Pupils are equal, round, and reactive to light.  Cardiovascular:      Rate and Rhythm: Normal rate and regular rhythm.     Heart sounds: Normal heart sounds.  Pulmonary:     Effort: Pulmonary effort is normal. No respiratory distress.     Breath sounds: Normal breath sounds.  Abdominal:     Palpations: Abdomen is soft.  Musculoskeletal:     Cervical back: Normal range of motion and neck supple.     Right lower leg: No edema.     Left lower leg: No edema.  Skin:    General: Skin is warm and dry.  Neurological:     Mental Status: He is alert. Mental status is at baseline.     ED Results / Procedures / Treatments   Labs (all labs ordered are listed, but only abnormal results are displayed) Labs Reviewed  COMPREHENSIVE METABOLIC PANEL - Abnormal; Notable for the following components:      Result Value   Potassium 2.7 (*)    Glucose, Bld 120 (*)    Creatinine, Ser 2.03 (*)    Calcium 8.6 (*)    Alkaline Phosphatase 168 (*)    GFR, Estimated 35 (*)    All other components within normal limits  CBC - Abnormal; Notable for the following components:   Hemoglobin 12.7 (*)    All other components within normal limits  BRAIN NATRIURETIC PEPTIDE - Abnormal; Notable for the following components:   B Natriuretic Peptide 2,310.0 (*)    All other components within normal limits  TROPONIN I (HIGH SENSITIVITY) - Abnormal; Notable for the following components:   Troponin I (High Sensitivity) 87 (*)    All other components within normal limits  TROPONIN I (HIGH SENSITIVITY) - Abnormal; Notable for the following components:   Troponin I (High Sensitivity) 96 (*)    All other components within normal limits  RESP PANEL BY RT-PCR (RSV, FLU A&B, COVID)  [id number]  MAGNESIUM  TROPONIN I (HIGH SENSITIVITY)    EKG EKG Interpretation  Date/Time:  Tuesday April 26 2022 02:02:25 EDT Ventricular Rate:  88 PR Interval:  266 QRS Duration: 123 QT Interval:  400 QTC Calculation: 484 R Axis:   26 Text Interpretation: Sinus rhythm Prolonged PR interval Anterior  infarct, old Nonspecific T abnormalities, lateral leads When compared with ECG of EARLIER SAME DATE HEART RATE has decreased T wave amplitude has decreased in Anterior leads Confirmed by Delora Fuel ([id number]) on 04/26/2022 2:06:25 AM  Radiology CT Angio Chest PE W and/or Wo Contrast  Result Date: 04/25/2022 CLINICAL DATA:  Shortness of breath EXAM: CT ANGIOGRAPHY CHEST WITH CONTRAST TECHNIQUE: Multidetector CT imaging of the chest was performed using the standard protocol during bolus administration of intravenous contrast. Multiplanar CT image reconstructions and MIPs were obtained to evaluate the vascular anatomy. RADIATION DOSE REDUCTION: This exam was performed  according to the departmental dose-optimization program which includes automated exposure control, adjustment of the mA and/or kV according to patient size and/or use of iterative reconstruction technique. CONTRAST:  62mL OMNIPAQUE IOHEXOL 350 MG/ML SOLN COMPARISON:  CT angiogram chest 11/13/2021.  CT abdomen 01/25/2022. FINDINGS: Cardiovascular: Heart is mildly enlarged. Aorta is normal in size. There is no pericardial effusion. There are atherosclerotic calcifications of the aorta. There is adequate opacification of the pulmonary arteries to the segmental level. There is no evidence for pulmonary embolism. Mediastinum/Nodes: There is a heterogeneous left thyroid nodule measuring up to 2.5 cm similar to prior. There are no enlarged mediastinal or hilar lymph nodes. Esophagus is nondilated. Lungs/Pleura: Mild emphysematous changes are present. There stable nodular densities in the right lower lobe measuring up to 3 mm. There is a new rounded 4 mm nodule in the superior segment of the left lower lobe image 6/43. The lungs are otherwise clear. There is no pleural effusion or pneumothorax. Upper Abdomen: Stable hypodense lesions in the liver. Stable pancreatic calcifications. Musculoskeletal: No chest wall abnormality. No acute or significant osseous  findings. Review of the MIP images confirms the above findings. IMPRESSION: 1. No evidence for pulmonary embolism. 2. Mild cardiomegaly. 3. New 4 mm left solid pulmonary nodule. Per Fleischner Society Guidelines, no routine follow-up imaging is recommended. These guidelines do not apply to immunocompromised patients and patients with cancer. Follow up in patients with significant comorbidities as clinically warranted. For lung cancer screening, adhere to Lung-RADS guidelines. Reference: Radiology. 2017; 284(1):228-43. 4. Stable right lower lobe pulmonary nodules measuring up to 3 mm. Aortic Atherosclerosis (ICD10-I70.0). Electronically Signed   By: Ronney Asters M.D.   On: 04/25/2022 23:35   DG Chest Port 1 View  Result Date: 04/25/2022 CLINICAL DATA:  Shortness of breath. EXAM: PORTABLE CHEST 1 VIEW COMPARISON:  04/13/2022. FINDINGS: The heart is enlarged and the mediastinal contour is within normal limits. There is atherosclerotic calcification of the aorta. Hyperinflation of the lungs is noted. There is chronic prominent interstitial thickening. No consolidation, effusion, or pneumothorax. No acute osseous abnormality. IMPRESSION: 1. Chronic interstitial prominence and hyperinflation of the lungs with no acute abnormality. 2. Cardiomegaly. Electronically Signed   By: Brett Fairy M.D.   On: 04/25/2022 22:18    Procedures Procedures   Medications Ordered in ED Medications  potassium chloride 10 mEq in 100 mL IVPB (has no administration in time range)  magnesium sulfate IVPB 2 g 50 mL (has no administration in time range)  potassium chloride (KLOR-CON) packet 80 mEq (80 mEq Oral Given 04/25/22 2339)  iohexol (OMNIPAQUE) 350 MG/ML injection 75 mL (75 mLs Intravenous Contrast Given 04/25/22 2311)  hydrALAZINE (APRESOLINE) tablet 25 mg (25 mg Oral Given 04/26/22 0125)  labetalol (NORMODYNE) injection 20 mg (20 mg Intravenous Given 04/26/22 0152)    ED Course/ Medical Decision Making/ A&P Clinical  Course as of 04/26/22 0209  Tue Apr 26, 2022  0038 Dr Josephine Cables from hospitalist consulted and recommends blood pressure control prior to admission. [RP]    Clinical Course User Index [RP] Fransico Meadow, MD                             Medical Decision Making Amount and/or Complexity of Data Reviewed Labs: ordered. Radiology: ordered.  Risk Prescription drug management. Decision regarding hospitalization.   Sauel Robnett is a 67 y.o. male with comorbidities that complicate the patient evaluation including CAD medically managed, heart failure with reduced  EF, CKD, HTN, HLD, COPD, and head and neck cancer status post partial face and nose reconstruction who presented to the emergency department shortness of breath.   Initial Ddx:  MI, PE, pericardial effusion, pneumonia, URI, heart failure exacerbation, COPD exacerbation  MDM:  With the patient's shortness of breath and clear lungs concerned about possible MI or PE.  Will obtain CTA of the chest to evaluate for PE which will also show if there is a significant pericardial effusion.  No significant cough or fevers to suggest pneumonia or URI.  No significant lower extremity swelling to suggest heart failure exacerbation but with his history of CHF will obtain BNP at this time.  No wheezing to suggest COPD exacerbation.  Plan:  Labs Troponin BNP COVID and flu CTA  ED Summary/Re-evaluation:  Patient underwent the above workup and was found to have hypokalemia with a troponin of 87 that up trended to 96.  CTA without evidence of PE or pneumonia.  BNP is elevated at 2300 but this is lower from priors.  Did not have any chest pain while in the emergency department but did have occasional bouts of shortness of breath.  Became more hypertensive in the emergency department requiring hydralazine and eventually IV labetalol.  Discussed with medicine for admission who would like to achieve better blood pressure control prior to admission.  Signed out to Dr Roxanne Mins awaiting admission.   This patient presents to the ED for concern of complaints listed in HPI, this involves an extensive number of treatment options, and is a complaint that carries with it a high risk of complications and morbidity. Disposition including potential need for admission considered.   Dispo: Pending remainder of workup  Records reviewed Outpatient Clinic Notes The following labs were independently interpreted: Chemistry and show  hypokalemia I independently reviewed the following imaging with scope of interpretation limited to determining acute life threatening conditions related to emergency care: CT Chest and agree with the radiologist interpretation with the following exceptions: none I personally reviewed and interpreted cardiac monitoring: normal sinus rhythm  I personally reviewed and interpreted the pt's EKG: see above for interpretation  I have reviewed the patients home medications and made adjustments as needed Consults: Hospitalist  Final Clinical Impression(s) / ED Diagnoses Final diagnoses:  SOB (shortness of breath)  Elevated troponin  Secondary hypertension   Rx / DC Orders ED Discharge Orders     None         Fransico Meadow, MD 04/26/22 [phone]

## 2022-04-27 DIAGNOSIS — N1832 Chronic kidney disease, stage 3b: Secondary | ICD-10-CM

## 2022-04-27 DIAGNOSIS — Z7189 Other specified counseling: Secondary | ICD-10-CM | POA: Diagnosis not present

## 2022-04-27 DIAGNOSIS — R7989 Other specified abnormal findings of blood chemistry: Secondary | ICD-10-CM | POA: Diagnosis not present

## 2022-04-27 DIAGNOSIS — Z515 Encounter for palliative care: Secondary | ICD-10-CM | POA: Diagnosis not present

## 2022-04-27 DIAGNOSIS — I5020 Unspecified systolic (congestive) heart failure: Secondary | ICD-10-CM

## 2022-04-27 LAB — CBC
HCT: 37.1 % — ABNORMAL LOW (ref 39.0–52.0)
Hemoglobin: 11.8 g/dL — ABNORMAL LOW (ref 13.0–17.0)
MCH: 28.3 pg (ref 26.0–34.0)
MCHC: 31.8 g/dL (ref 30.0–36.0)
MCV: 89 fL (ref 80.0–100.0)
Platelets: 181 10*3/uL (ref 150–400)
RBC: 4.17 MIL/uL — ABNORMAL LOW (ref 4.22–5.81)
RDW: 15.4 % (ref 11.5–15.5)
WBC: 7.1 10*3/uL (ref 4.0–10.5)
nRBC: 0 % (ref 0.0–0.2)

## 2022-04-27 LAB — COMPREHENSIVE METABOLIC PANEL
ALT: 28 U/L (ref 0–44)
AST: 30 U/L (ref 15–41)
Albumin: 3.1 g/dL — ABNORMAL LOW (ref 3.5–5.0)
Alkaline Phosphatase: 145 U/L — ABNORMAL HIGH (ref 38–126)
Anion gap: 7 (ref 5–15)
BUN: 25 mg/dL — ABNORMAL HIGH (ref 8–23)
CO2: 24 mmol/L (ref 22–32)
Calcium: 8.2 mg/dL — ABNORMAL LOW (ref 8.9–10.3)
Chloride: 107 mmol/L (ref 98–111)
Creatinine, Ser: 1.87 mg/dL — ABNORMAL HIGH (ref 0.61–1.24)
GFR, Estimated: 39 mL/min — ABNORMAL LOW (ref >60)
Glucose, Bld: 100 mg/dL — ABNORMAL HIGH (ref 70–99)
Potassium: 3.9 mmol/L (ref 3.5–5.1)
Sodium: 138 mmol/L (ref 135–145)
Total Bilirubin: 0.9 mg/dL (ref 0.3–1.2)
Total Protein: 6.3 g/dL — ABNORMAL LOW (ref 6.5–8.1)

## 2022-04-27 LAB — MAGNESIUM: Magnesium: 2.3 mg/dL (ref 1.7–2.4)

## 2022-04-27 LAB — PHOSPHORUS: Phosphorus: 2.8 mg/dL (ref 2.5–4.6)

## 2022-04-27 MED ORDER — CLOPIDOGREL BISULFATE 75 MG PO TABS: 75.0000 mg | ORAL_TABLET | Freq: Every day | ORAL | 1 refills | Status: DC

## 2022-04-27 MED ORDER — ALBUTEROL SULFATE HFA 108 (90 BASE) MCG/ACT IN AERS: 2.0000 | INHALATION_SPRAY | Freq: Four times a day (QID) | RESPIRATORY_TRACT | 2 refills | Status: DC | PRN

## 2022-04-27 MED ORDER — POTASSIUM CHLORIDE CRYS ER 20 MEQ PO TBCR
40.0000 meq | EXTENDED_RELEASE_TABLET | Freq: Once | ORAL | Status: AC
  Administered 2022-04-27: 40 meq via ORAL
  Filled 2022-04-27: qty 2

## 2022-04-27 MED ORDER — AMLODIPINE BESYLATE 5 MG PO TABS: 5.0000 mg | ORAL_TABLET | Freq: Every day | ORAL | 2 refills | Status: DC

## 2022-04-27 MED ORDER — SODIUM CHLORIDE 0.9 % IV SOLN: INTRAVENOUS | Status: AC

## 2022-04-27 MED ORDER — ISOSORBIDE MONONITRATE ER 60 MG PO TB24: 60.0000 mg | ORAL_TABLET | Freq: Every evening | ORAL | 1 refills | Status: DC

## 2022-04-27 MED ORDER — CARVEDILOL 12.5 MG PO TABS: 12.5000 mg | ORAL_TABLET | Freq: Two times a day (BID) | ORAL | 1 refills | Status: DC

## 2022-04-27 MED ORDER — ATORVASTATIN CALCIUM 80 MG PO TABS: 80.0000 mg | ORAL_TABLET | Freq: Every evening | ORAL | 3 refills | Status: DC

## 2022-04-27 MED ORDER — ENTRESTO 24-26 MG PO TABS: 1.0000 | ORAL_TABLET | Freq: Two times a day (BID) | ORAL | 6 refills | Status: DC

## 2022-04-27 NOTE — Discharge Summary (Signed)
Physician Discharge Summary   Patient: Michael [name] MRN: [id number] DOB: [birth year]  Admit date:     04/25/2022  Discharge date: 04/27/22  Discharge Physician: Deatra James   PCP: [name], MD   Recommendations at discharge:   With PCP in cardiology in 1-2 weeks- Highly recommend compliance with current medications   Discharge Diagnoses: Principal Problem:   Elevated troponin Active Problems:   Essential hypertension   Hypokalemia   COPD (chronic obstructive pulmonary disease) (HCC)   Chronic HFrEF (heart failure with reduced ejection fraction) (HCC)   CAD (coronary artery disease)   CKD (chronic kidney disease) stage 3, GFR 30-59 ml/min (HCC)   Hypertensive urgency   Pulmonary nodule  Resolved Problems:   * No resolved hospital problems. *  Hospital Course: Michael [name] is a 67 y.o. male with medical history significant of hypertension, hyperlipidemia, COPD, chronic normocytic anemia, head and neck cancer status post partial face and nose resection and construction, CAD, chronic HFrEF, CKD 3B   Presented emergency department due to complaint of shortness of breath.  He complained of chronic cough and subjective shortness of breath.  Patient denies chest pain, fever, chills, nausea, vomiting.  EMS was activated, and on arrival of EMS team, patient complained of difficulty breathing and that he was unable to catch his breath, however, O2 sat was 100% on room air when EMS checked.   ED Course:  BP was 140/95 on arrival to the ED, other vital signs were within normal range.  Workup in the ED showed normocytic anemia, BMP showed sodium 136, potassium 2.7, chloride 103, bicarb 24, blood glucose 120, BUN 23, creatinine 2.03, ALP 168, EGFR 35.  Troponin 87 > 96 > 119 > 107.  BNP 2,310 (chronically elevated, this was 2644 on 11/13/2021 and was > 4,500 on 11/07/2021).   Influenza A, B, SARS coronavirus 2, RSV was negative. Chest x-ray showed chronic interstitial prominence and  hyperinflation of the lungs with no acute abnormality.  Cardiomegaly CT angiography chest with contrast showed no evidence for pulmonary embolism.  Mild cardiomegaly.  Neuroform limited left solid pulmonary nodule.  Stable right lower lobe pulmonary nodules measuring up to 3 mm. Patient was treated with IV hydralazine, labetalol, potassium was replenished, magnesium was replenished.   Hospitalist was asked to admit patient for further evaluation and management.  EKG: Normal sinus rhythm at a rate of 88 bpm with prolonged PR interval and nonspecific T wave abnormalities   Assessment/Plan Present on Admission:  Elevated troponin  Hypertensive urgency  Essential hypertension  Hypokalemia  Chronic HFrEF (heart failure with reduced ejection fraction) (HCC)  CAD (coronary artery disease)  CKD (chronic kidney disease) stage 3, GFR 30-59 ml/min (HCC)  COPD (chronic obstructive pulmonary disease) (HCC)   Principal Problem:   Elevated troponin Active Problems:   Essential hypertension   Hypokalemia   COPD (chronic obstructive pulmonary disease) (HCC)   Chronic HFrEF (heart failure with reduced ejection fraction) (HCC)   CAD (coronary artery disease)   CKD (chronic kidney disease) stage 3, GFR 30-59 ml/min (HCC)   Hypertensive urgency   Pulmonary nodule   Elevated troponin-flattened -Denies any chest pain or shortness of breath, likely demand ischemia Troponin 87 > 96 > 119 > 107  EKG reviewed showed nonspecific T abnormalities with normal sinus rhythm and prolonged PR interval Cardiology consulted-recommending medical management with Plavix 75 mg daily, Imdur 60 mg daily, Coreg 12.5 mg twice a day and atorvastatin 80 mg daily   Cardiac Catheterization: 10/2021  Mid RCA lesion is 70% stenosed.   Acute Mrg lesion is 80% stenosed.  HFrEF with ischemic cardiomyopathy  Last echo reviewed, Euvolemic, Continue Coreg, Imdur   EF was at 30% in 10/2021. While his BNP is elevated to 310,  -  Medication compliance has not been entirely consistent per discussion, now tolerating Coreg and Entresto, has not been on Aldactone and at this point Lasix is held given renal insufficiency and volume contraction.   Hypertensive urgency-resolved Essential hypertension (uncontrolled) BP on admission 220/104, -Cardiology recommending:  Coreg 12.5 mg twice a day, Imdur 60 mg daily, Entresto 24-26 mg twice a day Discontinuing amlodipine   Hypokalemia Potassium 2.7, this was replenished Continue to monitor potassium level   Pulmonary nodule CT angiography of chest showed 3 mm RLL pulmonary nodules No routine follow-up imaging is recommended per radiologist, this does not apply to hemocompromised patients in patients with cancer.     CAD  Continue aspirin Plavix, Lipitor   CKD stage IIIb Euvolemic, creatinine level stable   COPD Continue Ventolin, Breztri  Consultants: Cardiology/social worker  Disposition: Home Diet recommendation:  Discharge Diet Orders (From admission, onward)     Start     Ordered   04/27/22 0000  Diet - low sodium heart healthy        04/27/22 1203           Cardiac diet DISCHARGE MEDICATION: Allergies as of 04/27/2022   No Known Allergies      Medication List     STOP taking these medications    ergocalciferol 1.25 MG (50000 UT) capsule Commonly known as: VITAMIN D2   furosemide 40 MG tablet Commonly known as: LASIX   HYDROcodone-acetaminophen 5-325 MG tablet Commonly known as: Norco   potassium chloride SA 20 MEQ tablet Commonly known as: KLOR-CON M   Repatha SureClick [id number] MG/ML Soaj Generic drug: Evolocumab       TAKE these medications    albuterol 108 (90 Base) MCG/ACT inhaler Commonly known as: VENTOLIN HFA Inhale 2 puffs into the lungs every 6 (six) hours as needed for wheezing or shortness of breath.   amLODipine 5 MG tablet Commonly known as: NORVASC Take 1 tablet (5 mg total) by mouth daily.   atorvastatin 80  MG tablet Commonly known as: LIPITOR Take 1 tablet (80 mg total) by mouth every evening.   Breztri Aerosphere 160-9-4.8 MCG/ACT Aero Generic drug: Budeson-Glycopyrrol-Formoterol Inhale 2 puffs into the lungs 2 (two) times daily.   carvedilol 12.5 MG tablet Commonly known as: COREG Take 1 tablet (12.5 mg total) by mouth 2 (two) times daily with a meal.   clopidogrel 75 MG tablet Commonly known as: PLAVIX Take 1 tablet (75 mg total) by mouth daily.   Entresto 24-26 MG Generic drug: sacubitril-valsartan Take 1 tablet by mouth 2 (two) times daily.   isosorbide mononitrate 60 MG 24 hr tablet Commonly known as: IMDUR Take 1 tablet (60 mg total) by mouth every evening.        Follow-up Information     Erma Heritage, PA-C Follow up on 05/13/2022.   Specialties: Cardiology, Cardiology Why: Cardiology Hospital Follow-up on 05/13/2022 at 2:30 PM. Contact information: [address] [place] [address] [phone]                Discharge Exam: Danley Danker Weights   04/25/22 2114  Weight: 70.3 kg        General:  AAO x 3,  cooperative, no distress;   HEENT:  Normocephalic, PERRL,  otherwise with in Normal limits   Neuro:  CNII-XII intact. , normal motor and sensation, reflexes intact   Lungs:   Clear to auscultation BL, Respirations unlabored,  No wheezes / crackles  Cardio:    S1/S2, RRR, No murmure, No Rubs or Gallops   Abdomen:  Soft, non-tender, bowel sounds active all four quadrants, no guarding or peritoneal signs.  Muscular  skeletal:  Limited exam -global generalized weaknesses - in bed, able to move all 4 extremities,   2+ pulses,  symmetric, No pitting edema  Skin:  Dry, warm to touch, negative for any Rashes,  Wounds: Please see nursing documentation          Condition at discharge: good  The results of significant diagnostics from this hospitalization (including imaging, microbiology, ancillary and laboratory) are listed below for reference.    Imaging Studies: CT Angio Chest PE W and/or Wo Contrast  Result Date: 04/25/2022 CLINICAL DATA:  Shortness of breath EXAM: CT ANGIOGRAPHY CHEST WITH CONTRAST TECHNIQUE: Multidetector CT imaging of the chest was performed using the standard protocol during bolus administration of intravenous contrast. Multiplanar CT image reconstructions and MIPs were obtained to evaluate the vascular anatomy. RADIATION DOSE REDUCTION: This exam was performed according to the departmental dose-optimization program which includes automated exposure control, adjustment of the mA and/or kV according to patient size and/or use of iterative reconstruction technique. CONTRAST:  76mL OMNIPAQUE IOHEXOL 350 MG/ML SOLN COMPARISON:  CT angiogram chest 11/13/2021.  CT abdomen 01/25/2022. FINDINGS: Cardiovascular: Heart is mildly enlarged. Aorta is normal in size. There is no pericardial effusion. There are atherosclerotic calcifications of the aorta. There is adequate opacification of the pulmonary arteries to the segmental level. There is no evidence for pulmonary embolism. Mediastinum/Nodes: There is a heterogeneous left thyroid nodule measuring up to 2.5 cm similar to prior. There are no enlarged mediastinal or hilar lymph nodes. Esophagus is nondilated. Lungs/Pleura: Mild emphysematous changes are present. There stable nodular densities in the right lower lobe measuring up to 3 mm. There is a new rounded 4 mm nodule in the superior segment of the left lower lobe image 6/43. The lungs are otherwise clear. There is no pleural effusion or pneumothorax. Upper Abdomen: Stable hypodense lesions in the liver. Stable pancreatic calcifications. Musculoskeletal: No chest wall abnormality. No acute or significant osseous findings. Review of the MIP images confirms the above findings. IMPRESSION: 1. No evidence for pulmonary embolism. 2. Mild cardiomegaly. 3. New 4 mm left solid pulmonary nodule. Per Fleischner Society Guidelines, no routine  follow-up imaging is recommended. These guidelines do not apply to immunocompromised patients and patients with cancer. Follow up in patients with significant comorbidities as clinically warranted. For lung cancer screening, adhere to Lung-RADS guidelines. Reference: Radiology. 2017; 284(1):228-43. 4. Stable right lower lobe pulmonary nodules measuring up to 3 mm. Aortic Atherosclerosis (ICD10-I70.0). Electronically Signed   By: Ronney Asters M.D.   On: 04/25/2022 23:35   DG Chest Port 1 View  Result Date: 04/25/2022 CLINICAL DATA:  Shortness of breath. EXAM: PORTABLE CHEST 1 VIEW COMPARISON:  04/13/2022. FINDINGS: The heart is enlarged and the mediastinal contour is within normal limits. There is atherosclerotic calcification of the aorta. Hyperinflation of the lungs is noted. There is chronic prominent interstitial thickening. No consolidation, effusion, or pneumothorax. No acute osseous abnormality. IMPRESSION: 1. Chronic interstitial prominence and hyperinflation of the lungs with no acute abnormality. 2. Cardiomegaly. Electronically Signed   By: Brett Fairy M.D.   On: 04/25/2022 22:18   MR BRAIN WO  CONTRAST  Result Date: 04/13/2022 CLINICAL DATA:  Stroke suspected EXAM: MRI HEAD WITHOUT CONTRAST TECHNIQUE: Multiplanar, multiecho pulse sequences of the brain and surrounding structures were obtained without intravenous contrast. COMPARISON:  MRI Head 06/04/17 FINDINGS: Brain: Negative for an acute infarct. Sequela of severe chronic microvascular ischemic change near confluence T2/FLAIR hyperintense periventricular signal abnormality. There are few small microhemorrhages in the bilateral thalami and cerebellar hemispheres, favored to be hypertensive in nature. No extra-axial fluid collection. Chronic infarct in the left basal ganglia. And bilateral corona radiata Vascular: Normal flow voids. Skull and upper cervical spine: There is a 1.5 cm cystic lesion in the subcutaneous soft tissues over the left  occipital calvarium, favored to represent a small epidermal inclusion cyst. Postsurgical changes of the right orbital floor. Sinuses/Orbits: No middle ear or mastoid effusion. Mucosal thickening left maxillary sinus and bilateral ethmoid sinuses. Other: None IMPRESSION: 1. No acute intracranial process. 2. Sequela of severe chronic microvascular ischemic change. Electronically Signed   By: Marin Roberts M.D.   On: 04/13/2022 15:25   DG Chest 1 View  Result Date: 04/13/2022 CLINICAL DATA:  near-syncope EXAM: CHEST  1 VIEW COMPARISON:  Chest x-ray 02/24/2022. FINDINGS: Chronic mild prominence of the lung markings. No consolidation. No visible pleural effusions or pneumothorax. Cardiomediastinal silhouette is unchanged. Polyarticular degenerative change. Osteopenia. No acute displaced fracture identified. IMPRESSION: No evidence of acute cardiopulmonary disease. Electronically Signed   By: Margaretha Sheffield M.D.   On: 04/13/2022 13:41    Microbiology: Results for orders placed or performed during the hospital encounter of 04/25/22  Resp panel by RT-PCR (RSV, Flu A&B, Covid) Anterior Nasal Swab     Status: None   Collection Time: 04/25/22  9:47 PM   Specimen: Anterior Nasal Swab  Result Value Ref Range Status   SARS Coronavirus 2 by RT PCR NEGATIVE NEGATIVE Final    Comment: (NOTE) SARS-CoV-2 target nucleic acids are NOT DETECTED.  The SARS-CoV-2 RNA is generally detectable in upper respiratory specimens during the acute phase of infection. The lowest concentration of SARS-CoV-2 viral copies this assay can detect is 138 copies/mL. A negative result does not preclude SARS-Cov-2 infection and should not be used as the sole basis for treatment or other patient management decisions. A negative result may occur with  improper specimen collection/handling, submission of specimen other than nasopharyngeal swab, presence of viral mutation(s) within the areas targeted by this assay, and inadequate number  of viral copies(<138 copies/mL). A negative result must be combined with clinical observations, patient history, and epidemiological information. The expected result is Negative.  Fact Sheet for Patients:  EntrepreneurPulse.com.au  Fact Sheet for Healthcare Providers:  IncredibleEmployment.be  This test is no t yet approved or cleared by the [place] FDA and  has been authorized for detection and/or diagnosis of SARS-CoV-2 by FDA under an Emergency Use Authorization (EUA). This EUA will remain  in effect (meaning this test can be used) for the duration of the COVID-19 declaration under Section 564(b)(1) of the Act, 21 U.S.C.section 360bbb-3(b)(1), unless the authorization is terminated  or revoked sooner.       Influenza A by PCR NEGATIVE NEGATIVE Final   Influenza B by PCR NEGATIVE NEGATIVE Final    Comment: (NOTE) The Xpert Xpress SARS-CoV-2/FLU/RSV plus assay is intended as an aid in the diagnosis of influenza from Nasopharyngeal swab specimens and should not be used as a sole basis for treatment. Nasal washings and aspirates are unacceptable for Xpert Xpress SARS-CoV-2/FLU/RSV testing.  Fact Sheet for Patients:  EntrepreneurPulse.com.au  Fact Sheet for Healthcare Providers: IncredibleEmployment.be  This test is not yet approved or cleared by the [place] FDA and has been authorized for detection and/or diagnosis of SARS-CoV-2 by FDA under an Emergency Use Authorization (EUA). This EUA will remain in effect (meaning this test can be used) for the duration of the COVID-19 declaration under Section 564(b)(1) of the Act, 21 U.S.C. section 360bbb-3(b)(1), unless the authorization is terminated or revoked.     Resp Syncytial Virus by PCR NEGATIVE NEGATIVE Final    Comment: (NOTE) Fact Sheet for Patients: EntrepreneurPulse.com.au  Fact Sheet for Healthcare  Providers: IncredibleEmployment.be  This test is not yet approved or cleared by the [place] FDA and has been authorized for detection and/or diagnosis of SARS-CoV-2 by FDA under an Emergency Use Authorization (EUA). This EUA will remain in effect (meaning this test can be used) for the duration of the COVID-19 declaration under Section 564(b)(1) of the Act, 21 U.S.C. section 360bbb-3(b)(1), unless the authorization is terminated or revoked.  Performed at [facility], [address] [place], [place] [address]     Labs: CBC: Recent Labs  Lab 04/25/22 2156 04/27/22 0506  WBC 10.5 7.1  HGB 12.7* 11.8*  HCT 39.2 37.1*  MCV 86.7 89.0  PLT 188 0000000   Basic Metabolic Panel: Recent Labs  Lab 04/25/22 2156 04/26/22 0152 04/27/22 0506  NA 136 136 138  K 2.7* 3.7 3.9  CL 103 102 107  CO2 24 21* 24  GLUCOSE 120* 99 100*  BUN 23 22 25*  CREATININE 2.03* 1.98* 1.87*  CALCIUM 8.6* 8.9 8.2*  MG 1.8  --  2.3  PHOS  --   --  2.8   Liver Function Tests: Recent Labs  Lab 04/25/22 2156 04/27/22 0506  AST 35 30  ALT 28 28  ALKPHOS 168* 145*  BILITOT 0.8 0.9  PROT 6.8 6.3*  ALBUMIN 3.5 3.1*   CBG: No results for input(s): "GLUCAP" in the last 168 hours.  Discharge time spent: greater than 50  minutes.  Signed: Deatra James, MD [facility] 04/27/2022

## 2022-04-27 NOTE — Progress Notes (Signed)
Palliative: [name] is sitting up in the bed in his room.  He appears chronically ill and somewhat frail.  He is alert and oriented x 3, able to make his needs known.  There is no family at bedside at this time.  We talk about his acute health concerns and the treatment plan.  He tells me that he feels that he is getting strong enough to go home.  I ask if he has questions about our discussions.  He tells me that he has set limits on life support, no tracheotomy.  I encouraged him to share this choice with his son who would be his healthcare surrogate.  Conference with attending, bedside nursing staff, transition of care team related to patient condition, needs, goals of care, disposition.  Plan: Continue full scope/full code.  Would not accept tracheostomy.  Anticipate home with home health if needed  35 minutes Quinn Axe, NP Palliative medicine team Team phone [phone] Greater than 50% of this time was spent counseling and coordinating care related to the above assessment and plan.

## 2022-04-27 NOTE — Care Management Important Message (Signed)
Important Message  Patient Details  Name: Michael [name] MRN: [id number] Date of Birth: [birth year]   Medicare Important Message Given:  N/A - LOS <3 / Initial given by admissions     Tommy Medal 04/27/2022, 3:17 PM

## 2022-04-27 NOTE — Progress Notes (Signed)
Progress Note  Patient Name: Michael [name] Date of Encounter: 04/27/2022  Primary Cardiologist: Rozann Lesches, MD  Interval Summary   Chart reviewed.  States that he feels better today, no chest pain.  Did work with PT yesterday.  No palpitations, dizziness, or syncope.  Vital Signs    Vitals:   04/26/22 2007 04/26/22 2105 04/27/22 0503 04/27/22 0747  BP: (!) 106/57  127/70   Pulse: (!) 40 62 (!) 58   Resp: 18  18   Temp: 98.4 F (36.9 C)  98 F (36.7 C)   TempSrc:   Oral   SpO2: 98% 98% 99% 99%  Weight:      Height:        Intake/Output Summary (Last 24 hours) at 04/27/2022 0941 Last data filed at 04/26/2022 1700 Gross per 24 hour  Intake 480 ml  Output --  Net 480 ml   Filed Weights   04/25/22 2114  Weight: 70.3 kg    Physical Exam   GEN: No acute distress.   Neck: No JVD. Cardiac: RRR, no gallop.  Respiratory: Nonlabored. Clear to auscultation bilaterally. GI: Soft, nontender, bowel sounds present. MS: No edema.  ECG/Telemetry    Telemetry reviewed showing sinus rhythm, brief burst of NSVT.  Otherwise rare PVCs.  Labs    Chemistry Recent Labs  Lab 04/25/22 2156 04/26/22 0152 04/27/22 0506  NA 136 136 138  K 2.7* 3.7 3.9  CL 103 102 107  CO2 24 21* 24  GLUCOSE 120* 99 100*  BUN 23 22 25*  CREATININE 2.03* 1.98* 1.87*  CALCIUM 8.6* 8.9 8.2*  PROT 6.8  --  6.3*  ALBUMIN 3.5  --  3.1*  AST 35  --  30  ALT 28  --  28  ALKPHOS 168*  --  145*  BILITOT 0.8  --  0.9  GFRNONAA 35* 36* 39*  ANIONGAP 9 13 7     Hematology Recent Labs  Lab 04/25/22 2156 04/27/22 0506  WBC 10.5 7.1  RBC 4.52 4.17*  HGB 12.7* 11.8*  HCT 39.2 37.1*  MCV 86.7 89.0  MCH 28.1 28.3  MCHC 32.4 31.8  RDW 15.3 15.4  PLT 188 181   Cardiac Enzymes Recent Labs  Lab 04/13/22 1505 04/25/22 2156 04/25/22 2332 04/26/22 0152 04/26/22 0417  TROPONINIHS 50* 87* 96* 119* 107*   Lipid Panel     Component Value Date/Time   CHOL 128 08/19/2021 1030   TRIG 121  08/19/2021 1030   HDL 40 (L) 08/19/2021 1030   CHOLHDL 3.2 08/19/2021 1030   VLDL 24 08/19/2021 1030   LDLCALC 64 08/19/2021 1030    Cardiac Studies   Echocardiogram 10/21/2021:  1. LV function is depressed with diffuse hypokinesis in the lateral and  inferiror walls Compared to echo from 2019, LVEF is worse. . Left  ventricular ejection fraction, by estimation, is 30%. The left ventricle  has moderately decreased function. The  left ventricular internal cavity size was severely dilated. There is mild  left ventricular hypertrophy. Left ventricular diastolic parameters are  consistent with Grade III diastolic dysfunction (restrictive). Elevated  left atrial pressure.   2. Right ventricular systolic function is low normal. The right  ventricular size is normal. There is normal pulmonary artery systolic  pressure.   3. Left atrial size was severely dilated.   4. Right atrial size was moderately dilated.   5. A small pericardial effusion is present. The pericardial effusion is  circumferential.   6. Mild mitral valve regurgitation.  7. The aortic valve is tricuspid. Aortic valve regurgitation is not  visualized. Aortic valve sclerosis is present, with no evidence of aortic  valve stenosis.   8. The inferior vena cava dilated.    Cardiac Catheterization: 10/22/2021:   Mid RCA lesion is 70% stenosed.   Acute Mrg lesion is 80% stenosed.   1.  Mild to moderate obstructive coronary artery disease that is outweighed by the patient's degree of LV dysfunction.  There is a significant bifurcation lesion of the right coronary artery and large RV marginal branch.  This is moderately calcified and relatively tortuous.  Given the patient's lack of chest pain, medical therapy should be pursued. 2.  Cardiac output of 6.3 L/min, cardiac index of 3.3 L/min/m, mean RA pressure of 16 mmHg, mean wedge pressure of 21 mmHg, and LVEDP of 21 mmHg.  Assessment & Plan   1.  Elevated high-sensitivity  troponin I level, most likely reflecting demand ischemia, do not suspect ACS at this time.  Plan medical therapy for continued treatment of ischemic heart disease and HFrEF.  2.  HFrEF with ischemic cardiomyopathy and LVEF approximately 30%.  Medication compliance has not been entirely consistent per discussion, now tolerating Coreg and Entresto, has not been on Aldactone and at this point Lasix is held given renal insufficiency and volume contraction.  3.  CAD, overall moderate RCA/acute marginal disease by cardiac catheterization in September 2023 managed medically.  Currently on Plavix and Lipitor.  4.  Asymptomatic NSVT.  5.  CKD stage IIIb with acute renal insufficiency.  Creatinine down to 1.87 from 2.03.  6.  Hypokalemia, potassium repleted.  7.  Essential hypertension, blood pressure well-controlled this morning.  Anticipate further outpatient treatment, no ischemic workup planned at this time as an inpatient.  Continue Plavix, Lipitor, Coreg, Imdur, Norvasc, and Entresto.  Would not initiate Aldactone or SGLT2 inhibitor at this time.  Lasix with potassium supplement can be used on an as-needed basis for weight gain at home.  We will schedule follow-up in the [facility] cardiology clinic with repeat BMET in the next few weeks.  For questions or updates, please contact [place] Please consult [email or web address] for contact info under   Signed, Rozann Lesches, MD  04/27/2022, 9:41 AM

## 2022-04-27 NOTE — Progress Notes (Signed)
Patient had a 7 beat run of v-tach, denied chest pain or discomfort. Notified Dr. Josephine Cables to make aware, new order for potassium 9mEq po once.

## 2022-04-28 ENCOUNTER — Encounter (HOSPITAL_COMMUNITY): Payer: Self-pay

## 2022-04-28 ENCOUNTER — Emergency Department (HOSPITAL_COMMUNITY)
Admission: EM | Admit: 2022-04-28 | Discharge: 2022-04-28 | Disposition: A | Discharge status: Home / Self Care | Payer: 59 | Attending: Physician Assistant | Admitting: Physician Assistant

## 2022-04-28 ENCOUNTER — Emergency Department (HOSPITAL_COMMUNITY)
Admission: EM | Admit: 2022-04-28 | Discharge: 2022-04-29 | Disposition: A | Discharge status: Home / Self Care | Payer: 59 | Source: Home / Self Care | Attending: Emergency Medicine | Admitting: Emergency Medicine

## 2022-04-28 ENCOUNTER — Other Ambulatory Visit: Payer: Self-pay

## 2022-04-28 ENCOUNTER — Emergency Department (HOSPITAL_COMMUNITY): Payer: 59

## 2022-04-28 DIAGNOSIS — R55 Syncope and collapse: Secondary | ICD-10-CM | POA: Diagnosis not present

## 2022-04-28 DIAGNOSIS — R0602 Shortness of breath: Secondary | ICD-10-CM | POA: Insufficient documentation

## 2022-04-28 DIAGNOSIS — J441 Chronic obstructive pulmonary disease with (acute) exacerbation: Secondary | ICD-10-CM | POA: Insufficient documentation

## 2022-04-28 DIAGNOSIS — Z79899 Other long term (current) drug therapy: Secondary | ICD-10-CM | POA: Diagnosis not present

## 2022-04-28 DIAGNOSIS — I209 Angina pectoris, unspecified: Secondary | ICD-10-CM | POA: Insufficient documentation

## 2022-04-28 DIAGNOSIS — I509 Heart failure, unspecified: Secondary | ICD-10-CM | POA: Diagnosis not present

## 2022-04-28 DIAGNOSIS — R0609 Other forms of dyspnea: Secondary | ICD-10-CM

## 2022-04-28 DIAGNOSIS — I5022 Chronic systolic (congestive) heart failure: Secondary | ICD-10-CM | POA: Insufficient documentation

## 2022-04-28 DIAGNOSIS — Z5900 Homelessness unspecified: Secondary | ICD-10-CM | POA: Insufficient documentation

## 2022-04-28 DIAGNOSIS — S0990XA Unspecified injury of head, initial encounter: Secondary | ICD-10-CM | POA: Diagnosis not present

## 2022-04-28 DIAGNOSIS — R7989 Other specified abnormal findings of blood chemistry: Secondary | ICD-10-CM | POA: Diagnosis not present

## 2022-04-28 DIAGNOSIS — J439 Emphysema, unspecified: Secondary | ICD-10-CM | POA: Diagnosis not present

## 2022-04-28 DIAGNOSIS — I2089 Other forms of angina pectoris: Secondary | ICD-10-CM

## 2022-04-28 DIAGNOSIS — J449 Chronic obstructive pulmonary disease, unspecified: Secondary | ICD-10-CM | POA: Insufficient documentation

## 2022-04-28 DIAGNOSIS — R059 Cough, unspecified: Secondary | ICD-10-CM | POA: Diagnosis not present

## 2022-04-28 DIAGNOSIS — I110 Hypertensive heart disease with heart failure: Secondary | ICD-10-CM | POA: Diagnosis not present

## 2022-04-28 DIAGNOSIS — R0600 Dyspnea, unspecified: Secondary | ICD-10-CM | POA: Insufficient documentation

## 2022-04-28 LAB — CBC WITH DIFFERENTIAL/PLATELET
Abs Immature Granulocytes: 0.02 10*3/uL (ref 0.00–0.07)
Basophils Absolute: 0.1 10*3/uL (ref 0.0–0.1)
Basophils Relative: 1 %
Eosinophils Absolute: 0.2 10*3/uL (ref 0.0–0.5)
Eosinophils Relative: 3 %
HCT: 38.3 % — ABNORMAL LOW (ref 39.0–52.0)
Hemoglobin: 12.3 g/dL — ABNORMAL LOW (ref 13.0–17.0)
Immature Granulocytes: 0 %
Lymphocytes Relative: 20 %
Lymphs Abs: 1.5 10*3/uL (ref 0.7–4.0)
MCH: 28.4 pg (ref 26.0–34.0)
MCHC: 32.1 g/dL (ref 30.0–36.0)
MCV: 88.5 fL (ref 80.0–100.0)
Monocytes Absolute: 0.7 10*3/uL (ref 0.1–1.0)
Monocytes Relative: 9 %
Neutro Abs: 5 10*3/uL (ref 1.7–7.7)
Neutrophils Relative %: 67 %
Platelets: 223 10*3/uL (ref 150–400)
RBC: 4.33 MIL/uL (ref 4.22–5.81)
RDW: 15.1 % (ref 11.5–15.5)
WBC: 7.5 10*3/uL (ref 4.0–10.5)
nRBC: 0 % (ref 0.0–0.2)

## 2022-04-28 NOTE — ED Triage Notes (Signed)
Pt arrives [facility] from the [facility] here in [place] for SOB.   Hospitalized at [facility] for COPD exacerbation from 3/25 - 3/28. Discharged to the [facility] this morning. Came to the ED today around 13:30 for SOB. Evaluated and discharged. Is back again for the same thing, shortness of breath.   Pt did pick up the prescribed medications from the pharmacy. Pt continues to smoke.

## 2022-04-28 NOTE — Care Management Important Message (Signed)
Important Message  Patient Details  Name: Michael [name] MRN: [id number] Date of Birth: 02/26/1955   Medicare Important Message Given:  Yes     Tommy Medal 04/28/2022, 11:30 AM

## 2022-04-28 NOTE — TOC Transition Note (Signed)
Transition of Care [facility]) - CM/SW Discharge Note   Patient Details  Name: Michael [name] MRN: [id number] Date of Birth: 1956-01-10  [facility] CM/SW Contact:  Iona Beard, [place] Phone Number: 04/28/2022, 10:34 AM   Clinical Narrative:    TOC updated that pt did not discharge due to homelessness. CSW explained that pt can be provided with shelter list however hospital cannot hold pt due to homelessness. Pt was seen by PT an had no follow up recommendation as he walker 120 feet independently. TOC spoke to pt who states he is willing to go to a shelter. TOC spoke with on call [facility] supervisor who states pts options are the [facility] or he can pay for a hotel. Pt wishes to go to the [facility]. CSW updated treatment team. CSW confirmed with [facility] that pts medications are ready for pickup. TOC set up [facility], [facility] will take pt to pharmacy to pick up medications and then to the [facility]. TOC signing off.     Barriers to Discharge: Barriers Resolved   Patient Goals and CMS Choice CMS Medicare.gov Compare Post Acute Care list provided to:: Patient Choice offered to / list presented to : Patient  Discharge Placement                         Discharge Plan and Services Additional resources added to the After Visit Summary for   In-house Referral: Clinical Social Work Discharge Planning Services: CM Consult                                 Social Determinants of Health (SDOH) Interventions SDOH Screenings   Food Insecurity: No Food Insecurity (10/20/2021)  Housing: Low Risk  (10/20/2021)  Transportation Needs: No Transportation Needs (10/20/2021)  Utilities: Not At Risk (10/20/2021)  Alcohol Screen: Low Risk  (12/11/2019)  Depression (PHQ2-9): Low Risk  (12/11/2019)  Financial Resource Strain: Low Risk  (12/11/2019)  Physical Activity: Inactive (12/11/2019)  Social Connections: Socially Isolated (12/11/2019)  Stress: No Stress Concern Present  (12/11/2019)  Tobacco Use: Medium Risk (04/26/2022)     Readmission Risk Interventions    04/26/2022   11:46 AM 10/26/2021    2:57 PM 02/18/2021   11:33 AM  Readmission Risk Prevention Plan  Transportation Screening Complete Complete Complete  PCP or Specialist Appt within 5-7 Days   Complete  PCP or Specialist Appt within 3-5 Days  Complete   Home Care Screening   Complete  Medication Review (RN CM)   Complete  HRI or Home Care Consult Complete Complete   Social Work Consult for [place] Planning/Counseling Complete Complete   Palliative Care Screening Not Applicable Not Applicable   Medication Review Press photographer) Complete Complete

## 2022-04-28 NOTE — ED Notes (Signed)
EDP at bedside  

## 2022-04-28 NOTE — ED Provider Notes (Signed)
[facility] Provider Note   CSN: [id number] Arrival date & time: 04/28/22  1327     History  Chief Complaint  Patient presents with   Shortness of Breath    Michael [name] is a 67 y.o. male.  Was discharged from any [facility] today he was admitted on 326 with an exacerbation of COPD.  Patient is homeless and he was transported to [place] to go to the [facility].  And to pick up his medications from the pharmacy here.  Patient reports he became very short of breath while walking to the pharmacy. he came to the emergency department because of his shortness of breath.  Patient reports he feels better now he used 2 of his inhalers but not his albuterol.  The history is provided by the patient. No language interpreter was used.  Shortness of Breath Severity:  Moderate Onset quality:  Sudden Timing:  Constant Chronicity:  Recurrent Context: activity   Relieved by:  Inhaler Worsened by:  Nothing Associated symptoms: no chest pain        Home Medications Prior to Admission medications   Medication Sig Start Date End Date Taking? Authorizing Provider  albuterol (VENTOLIN HFA) 108 (90 Base) MCG/ACT inhaler Inhale 2 puffs into the lungs every 6 (six) hours as needed for wheezing or shortness of breath. 04/27/22   Shahmehdi, Seyed A, MD  amLODipine (NORVASC) 5 MG tablet Take 1 tablet (5 mg total) by mouth daily. 04/27/22   Shahmehdi, Valeria Batman, MD  atorvastatin (LIPITOR) 80 MG tablet Take 1 tablet (80 mg total) by mouth every evening. 04/27/22   Shahmehdi, Valeria Batman, MD  Budeson-Glycopyrrol-Formoterol (BREZTRI AEROSPHERE) 160-9-4.8 MCG/ACT AERO Inhale 2 puffs into the lungs 2 (two) times daily.    [provider]  carvedilol (COREG) 12.5 MG tablet Take 1 tablet (12.5 mg total) by mouth 2 (two) times daily with a meal. 04/27/22   Shahmehdi, Valeria Batman, MD  clopidogrel (PLAVIX) 75 MG tablet Take 1 tablet (75 mg total) by mouth daily. 04/27/22    Shahmehdi, Valeria Batman, MD  isosorbide mononitrate (IMDUR) 60 MG 24 hr tablet Take 1 tablet (60 mg total) by mouth every evening. 04/27/22   Shahmehdi, Valeria Batman, MD  sacubitril-valsartan (ENTRESTO) 24-26 MG Take 1 tablet by mouth 2 (two) times daily. 04/27/22   Deatra James, MD      Allergies    Patient has no known allergies.    Review of Systems   Review of Systems  Respiratory:  Positive for shortness of breath.   Cardiovascular:  Negative for chest pain.  All other systems reviewed and are negative.   Physical Exam Updated Vital Signs BP (!) 150/79 (BP Location: Left Arm)   Pulse 63   Temp 97.6 F (36.4 C) (Oral)   Resp 18   Ht 6' (1.829 m)   Wt 65.8 kg   SpO2 100%   BMI 19.67 kg/m  Physical Exam Vitals and nursing note reviewed.  Constitutional:      Appearance: He is well-developed.  HENT:     Head: Normocephalic.  Cardiovascular:     Rate and Rhythm: Normal rate.  Pulmonary:     Effort: Pulmonary effort is normal.     Breath sounds: Examination of the right-lower field reveals rhonchi. Examination of the left-lower field reveals rhonchi. Rhonchi present.  Abdominal:     General: There is no distension.  Musculoskeletal:        General: Normal range of  motion.     Cervical back: Normal range of motion.  Skin:    General: Skin is warm.  Neurological:     General: No focal deficit present.     Mental Status: He is alert and oriented to person, place, and time.  Psychiatric:        Mood and Affect: Mood normal.     ED Results / Procedures / Treatments   Labs (all labs ordered are listed, but only abnormal results are displayed) Labs Reviewed - No data to display  EKG None  Radiology No results found.  Procedures Procedures    Medications Ordered in ED Medications - No data to display  ED Course/ Medical Decision Making/ A&P                             Medical Decision Making Patient discharge for any pain in this a.m. patient here in  [place] to pick up his medications and to go to [facility] because he is homeless  Amount and/or Complexity of Data Reviewed External Data Reviewed: notes.    Details: Hospitalist notes reviewed Radiology: ordered and independent interpretation performed. Decision-making details documented in ED Course.    Details: Chest x-ray ordered, no acute change           Final Clinical Impression(s) / ED Diagnoses Final diagnoses:  Dyspnea on exertion    Rx / DC Orders ED Discharge Orders     None      An After Visit Summary was printed and given to the patient.    Sidney Ace 04/28/22 1528    Dorie Rank, MD 04/29/22 [phone]

## 2022-04-28 NOTE — ED Triage Notes (Signed)
Patient BIB [facility] for SOB from [facility]. Patient recently seen at [facility] for [place], d/c with inhaler. Patient states breathing is not as bad when sitting up. EMS reports h/x of COPD. EMS reports o2 sat of 99% on room air. Patient A&Ox4.

## 2022-04-28 NOTE — Progress Notes (Signed)
Physician Discharge Summary   Patient: Michael [name] MRN: [id number] DOB: [birth year]  Admit date:     04/25/2022  Discharge date: 04/28/22  Discharge Physician: Deatra James   PCP: [name], MD   Recommendations at discharge:   With PCP in cardiology in 1-2 weeks- Highly recommend compliance with current medications   The patient was seen and examined this morning, stable no acute distress.   Patient was discharged yesterday, pending discharge-reporting that patient is homeless   [facility] assistant to discuss options...Marland KitchenMarland KitchenMarland Kitchen   discharge Diagnoses: Principal Problem:   Elevated troponin Active Problems:   Essential hypertension   Hypokalemia   COPD (chronic obstructive pulmonary disease) (HCC)   Chronic HFrEF (heart failure with reduced ejection fraction) (HCC)   CAD (coronary artery disease)   CKD (chronic kidney disease) stage 3, GFR 30-59 ml/min (HCC)   Hypertensive urgency   Pulmonary nodule  Resolved Problems:   * No resolved hospital problems. *  Hospital Course: Michael [name] is a 67 y.o. male with medical history significant of hypertension, hyperlipidemia, COPD, chronic normocytic anemia, head and neck cancer status post partial face and nose resection and construction, CAD, chronic HFrEF, CKD 3B   Presented emergency department due to complaint of shortness of breath.  He complained of chronic cough and subjective shortness of breath.  Patient denies chest pain, fever, chills, nausea, vomiting.  EMS was activated, and on arrival of EMS team, patient complained of difficulty breathing and that he was unable to catch his breath, however, O2 sat was 100% on room air when EMS checked.   ED Course:  BP was 140/95 on arrival to the ED, other vital signs were within normal range.  Workup in the ED showed normocytic anemia, BMP showed sodium 136, potassium 2.7, chloride 103, bicarb 24, blood glucose 120, BUN 23, creatinine 2.03, ALP 168, EGFR 35.  Troponin 87 > 96 > 119 >  107.  BNP 2,310 (chronically elevated, this was 2644 on 11/13/2021 and was > 4,500 on 11/07/2021).   Influenza A, B, SARS coronavirus 2, RSV was negative. Chest x-ray showed chronic interstitial prominence and hyperinflation of the lungs with no acute abnormality.  Cardiomegaly CT angiography chest with contrast showed no evidence for pulmonary embolism.  Mild cardiomegaly.  Neuroform limited left solid pulmonary nodule.  Stable right lower lobe pulmonary nodules measuring up to 3 mm. Patient was treated with IV hydralazine, labetalol, potassium was replenished, magnesium was replenished.   Hospitalist was asked to admit patient for further evaluation and management.  EKG: Normal sinus rhythm at a rate of 88 bpm with prolonged PR interval and nonspecific T wave abnormalities   ----------------------------------------------------------------------------------------------------------------------   Elevated troponin-flattened -Denies any chest pain or shortness of breath, likely demand ischemia Troponin 87 > 96 > 119 > 107  EKG reviewed showed nonspecific T abnormalities with normal sinus rhythm and prolonged PR interval Cardiology consulted-recommending medical management with Plavix 75 mg daily, Imdur 60 mg daily, Coreg 12.5 mg twice a day and atorvastatin 80 mg daily   Cardiac Catheterization: 10/2021   Mid RCA lesion is 70% stenosed.   Acute Mrg lesion is 80% stenosed.  HFrEF with ischemic cardiomyopathy  Last echo reviewed, Euvolemic, Continue Coreg, Imdur   EF was at 30% in 10/2021. While his BNP is elevated to 310,  - Medication compliance has not been entirely consistent per discussion, now tolerating Coreg and Entresto, has not been on Aldactone and at this point Lasix is held given renal insufficiency and volume  contraction.   Hypertensive urgency-resolved Essential hypertension (uncontrolled) BP on admission 220/104, -Cardiology recommending:  Coreg 12.5 mg twice a day, Imdur  60 mg daily, Entresto 24-26 mg twice a day Discontinuing amlodipine   Hypokalemia Potassium 2.7, this was replenished Continue to monitor potassium level   Pulmonary nodule CT angiography of chest showed 3 mm RLL pulmonary nodules No routine follow-up imaging is recommended per radiologist, this does not apply to hemocompromised patients in patients with cancer.     CAD  Continue aspirin Plavix, Lipitor   CKD stage IIIb Euvolemic, creatinine level stable   COPD Continue Ventolin, Breztri  Consultants: Cardiology/social worker  Disposition: Home -patient is homeless according TOC for living arrangement Diet recommendation:  Discharge Diet Orders (From admission, onward)     Start     Ordered   04/27/22 0000  Diet - low sodium heart healthy        04/27/22 1203           Cardiac diet DISCHARGE MEDICATION: Allergies as of 04/28/2022   No Known Allergies      Medication List     STOP taking these medications    ergocalciferol 1.25 MG (50000 UT) capsule Commonly known as: VITAMIN D2   furosemide 40 MG tablet Commonly known as: LASIX   HYDROcodone-acetaminophen 5-325 MG tablet Commonly known as: Norco   potassium chloride SA 20 MEQ tablet Commonly known as: KLOR-CON M   Repatha SureClick [id number] MG/ML Soaj Generic drug: Evolocumab       TAKE these medications    albuterol 108 (90 Base) MCG/ACT inhaler Commonly known as: VENTOLIN HFA Inhale 2 puffs into the lungs every 6 (six) hours as needed for wheezing or shortness of breath.   amLODipine 5 MG tablet Commonly known as: NORVASC Take 1 tablet (5 mg total) by mouth daily.   atorvastatin 80 MG tablet Commonly known as: LIPITOR Take 1 tablet (80 mg total) by mouth every evening.   Breztri Aerosphere 160-9-4.8 MCG/ACT Aero Generic drug: Budeson-Glycopyrrol-Formoterol Inhale 2 puffs into the lungs 2 (two) times daily.   carvedilol 12.5 MG tablet Commonly known as: COREG Take 1 tablet (12.5 mg  total) by mouth 2 (two) times daily with a meal.   clopidogrel 75 MG tablet Commonly known as: PLAVIX Take 1 tablet (75 mg total) by mouth daily.   Entresto 24-26 MG Generic drug: sacubitril-valsartan Take 1 tablet by mouth 2 (two) times daily.   isosorbide mononitrate 60 MG 24 hr tablet Commonly known as: IMDUR Take 1 tablet (60 mg total) by mouth every evening.        Follow-up Information     Erma Heritage, PA-C Follow up on 05/13/2022.   Specialties: Cardiology, Cardiology Why: Cardiology Hospital Follow-up on 05/13/2022 at 2:30 PM. Contact information: [address] [place]  [id number] [phone]                Discharge Exam: Danley Danker Weights   04/25/22 2114  Weight: 70.3 kg       General:  AAO x 3,  cooperative, no distress;   HEENT:  Normocephalic, PERRL, otherwise with in Normal limits   Neuro:  CNII-XII intact. , normal motor and sensation, reflexes intact   Lungs:   Clear to auscultation BL, Respirations unlabored,  No wheezes / crackles  Cardio:    S1/S2, RRR, No murmure, No Rubs or Gallops   Abdomen:  Soft, non-tender, bowel sounds active all four quadrants, no guarding or peritoneal signs.  Muscular  skeletal:  Limited exam -global generalized weaknesses - in bed, able to move all 4 extremities,   2+ pulses,  symmetric, No pitting edema  Skin:  Dry, warm to touch, negative for any Rashes,  Wounds: Please see nursing documentation             Condition at discharge: good  The results of significant diagnostics from this hospitalization (including imaging, microbiology, ancillary and laboratory) are listed below for reference.   Imaging Studies: CT Angio Chest PE W and/or Wo Contrast  Result Date: 04/25/2022 CLINICAL DATA:  Shortness of breath EXAM: CT ANGIOGRAPHY CHEST WITH CONTRAST TECHNIQUE: Multidetector CT imaging of the chest was performed using the standard protocol during bolus administration of intravenous contrast.  Multiplanar CT image reconstructions and MIPs were obtained to evaluate the vascular anatomy. RADIATION DOSE REDUCTION: This exam was performed according to the departmental dose-optimization program which includes automated exposure control, adjustment of the mA and/or kV according to patient size and/or use of iterative reconstruction technique. CONTRAST:  56mL OMNIPAQUE IOHEXOL 350 MG/ML SOLN COMPARISON:  CT angiogram chest 11/13/2021.  CT abdomen 01/25/2022. FINDINGS: Cardiovascular: Heart is mildly enlarged. Aorta is normal in size. There is no pericardial effusion. There are atherosclerotic calcifications of the aorta. There is adequate opacification of the pulmonary arteries to the segmental level. There is no evidence for pulmonary embolism. Mediastinum/Nodes: There is a heterogeneous left thyroid nodule measuring up to 2.5 cm similar to prior. There are no enlarged mediastinal or hilar lymph nodes. Esophagus is nondilated. Lungs/Pleura: Mild emphysematous changes are present. There stable nodular densities in the right lower lobe measuring up to 3 mm. There is a new rounded 4 mm nodule in the superior segment of the left lower lobe image 6/43. The lungs are otherwise clear. There is no pleural effusion or pneumothorax. Upper Abdomen: Stable hypodense lesions in the liver. Stable pancreatic calcifications. Musculoskeletal: No chest wall abnormality. No acute or significant osseous findings. Review of the MIP images confirms the above findings. IMPRESSION: 1. No evidence for pulmonary embolism. 2. Mild cardiomegaly. 3. New 4 mm left solid pulmonary nodule. Per Fleischner Society Guidelines, no routine follow-up imaging is recommended. These guidelines do not apply to immunocompromised patients and patients with cancer. Follow up in patients with significant comorbidities as clinically warranted. For lung cancer screening, adhere to Lung-RADS guidelines. Reference: Radiology. 2017; 284(1):228-43. 4. Stable  right lower lobe pulmonary nodules measuring up to 3 mm. Aortic Atherosclerosis (ICD10-I70.0). Electronically Signed   By: Ronney Asters M.D.   On: 04/25/2022 23:35   DG Chest Port 1 View  Result Date: 04/25/2022 CLINICAL DATA:  Shortness of breath. EXAM: PORTABLE CHEST 1 VIEW COMPARISON:  04/13/2022. FINDINGS: The heart is enlarged and the mediastinal contour is within normal limits. There is atherosclerotic calcification of the aorta. Hyperinflation of the lungs is noted. There is chronic prominent interstitial thickening. No consolidation, effusion, or pneumothorax. No acute osseous abnormality. IMPRESSION: 1. Chronic interstitial prominence and hyperinflation of the lungs with no acute abnormality. 2. Cardiomegaly. Electronically Signed   By: Brett Fairy M.D.   On: 04/25/2022 22:18   MR BRAIN WO CONTRAST  Result Date: 04/13/2022 CLINICAL DATA:  Stroke suspected EXAM: MRI HEAD WITHOUT CONTRAST TECHNIQUE: Multiplanar, multiecho pulse sequences of the brain and surrounding structures were obtained without intravenous contrast. COMPARISON:  MRI Head 06/04/17 FINDINGS: Brain: Negative for an acute infarct. Sequela of severe chronic microvascular ischemic change near confluence T2/FLAIR hyperintense periventricular signal abnormality. There are few small microhemorrhages in the bilateral thalami  and cerebellar hemispheres, favored to be hypertensive in nature. No extra-axial fluid collection. Chronic infarct in the left basal ganglia. And bilateral corona radiata Vascular: Normal flow voids. Skull and upper cervical spine: There is a 1.5 cm cystic lesion in the subcutaneous soft tissues over the left occipital calvarium, favored to represent a small epidermal inclusion cyst. Postsurgical changes of the right orbital floor. Sinuses/Orbits: No middle ear or mastoid effusion. Mucosal thickening left maxillary sinus and bilateral ethmoid sinuses. Other: None IMPRESSION: 1. No acute intracranial process. 2. Sequela  of severe chronic microvascular ischemic change. Electronically Signed   By: Marin Roberts M.D.   On: 04/13/2022 15:25   DG Chest 1 View  Result Date: 04/13/2022 CLINICAL DATA:  near-syncope EXAM: CHEST  1 VIEW COMPARISON:  Chest x-ray 02/24/2022. FINDINGS: Chronic mild prominence of the lung markings. No consolidation. No visible pleural effusions or pneumothorax. Cardiomediastinal silhouette is unchanged. Polyarticular degenerative change. Osteopenia. No acute displaced fracture identified. IMPRESSION: No evidence of acute cardiopulmonary disease. Electronically Signed   By: Margaretha Sheffield M.D.   On: 04/13/2022 13:41    Microbiology: Results for orders placed or performed during the hospital encounter of 04/25/22  Resp panel by RT-PCR (RSV, Flu A&B, Covid) Anterior Nasal Swab     Status: None   Collection Time: 04/25/22  9:47 PM   Specimen: Anterior Nasal Swab  Result Value Ref Range Status   SARS Coronavirus 2 by RT PCR NEGATIVE NEGATIVE Final    Comment: (NOTE) SARS-CoV-2 target nucleic acids are NOT DETECTED.  The SARS-CoV-2 RNA is generally detectable in upper respiratory specimens during the acute phase of infection. The lowest concentration of SARS-CoV-2 viral copies this assay can detect is 138 copies/mL. A negative result does not preclude SARS-Cov-2 infection and should not be used as the sole basis for treatment or other patient management decisions. A negative result may occur with  improper specimen collection/handling, submission of specimen other than nasopharyngeal swab, presence of viral mutation(s) within the areas targeted by this assay, and inadequate number of viral copies(<138 copies/mL). A negative result must be combined with clinical observations, patient history, and epidemiological information. The expected result is Negative.  Fact Sheet for Patients:  EntrepreneurPulse.com.au  Fact Sheet for Healthcare Providers:   IncredibleEmployment.be  This test is no t yet approved or cleared by the [place] FDA and  has been authorized for detection and/or diagnosis of SARS-CoV-2 by FDA under an Emergency Use Authorization (EUA). This EUA will remain  in effect (meaning this test can be used) for the duration of the COVID-19 declaration under Section 564(b)(1) of the Act, 21 U.S.C.section 360bbb-3(b)(1), unless the authorization is terminated  or revoked sooner.       Influenza A by PCR NEGATIVE NEGATIVE Final   Influenza B by PCR NEGATIVE NEGATIVE Final    Comment: (NOTE) The Xpert Xpress SARS-CoV-2/FLU/RSV plus assay is intended as an aid in the diagnosis of influenza from Nasopharyngeal swab specimens and should not be used as a sole basis for treatment. Nasal washings and aspirates are unacceptable for Xpert Xpress SARS-CoV-2/FLU/RSV testing.  Fact Sheet for Patients: EntrepreneurPulse.com.au  Fact Sheet for Healthcare Providers: IncredibleEmployment.be  This test is not yet approved or cleared by the [place] FDA and has been authorized for detection and/or diagnosis of SARS-CoV-2 by FDA under an Emergency Use Authorization (EUA). This EUA will remain in effect (meaning this test can be used) for the duration of the COVID-19 declaration under Section 564(b)(1) of the Act, 21  U.S.C. section 360bbb-3(b)(1), unless the authorization is terminated or revoked.     Resp Syncytial Virus by PCR NEGATIVE NEGATIVE Final    Comment: (NOTE) Fact Sheet for Patients: EntrepreneurPulse.com.au  Fact Sheet for Healthcare Providers: IncredibleEmployment.be  This test is not yet approved or cleared by the [place] FDA and has been authorized for detection and/or diagnosis of SARS-CoV-2 by FDA under an Emergency Use Authorization (EUA). This EUA will remain in effect (meaning this test can be used) for  the duration of the COVID-19 declaration under Section 564(b)(1) of the Act, 21 U.S.C. section 360bbb-3(b)(1), unless the authorization is terminated or revoked.  Performed at [facility], [address] [place], [place] [address]     Labs: CBC: Recent Labs  Lab 04/25/22 2156 04/27/22 0506  WBC 10.5 7.1  HGB 12.7* 11.8*  HCT 39.2 37.1*  MCV 86.7 89.0  PLT 188 0000000   Basic Metabolic Panel: Recent Labs  Lab 04/25/22 2156 04/26/22 0152 04/27/22 0506  NA 136 136 138  K 2.7* 3.7 3.9  CL 103 102 107  CO2 24 21* 24  GLUCOSE 120* 99 100*  BUN 23 22 25*  CREATININE 2.03* 1.98* 1.87*  CALCIUM 8.6* 8.9 8.2*  MG 1.8  --  2.3  PHOS  --   --  2.8   Liver Function Tests: Recent Labs  Lab 04/25/22 2156 04/27/22 0506  AST 35 30  ALT 28 28  ALKPHOS 168* 145*  BILITOT 0.8 0.9  PROT 6.8 6.3*  ALBUMIN 3.5 3.1*   CBG: No results for input(s): "GLUCAP" in the last 168 hours.  Discharge time spent: greater than 50  minutes.  Signed: Deatra James, MD [facility] 04/28/2022

## 2022-04-28 NOTE — Progress Notes (Signed)
Nsg Discharge Note  Admit Date:  04/25/2022 Discharge date: 04/28/2022   Michael [name] to be D/C'd Home per MD order.  AVS completed.   Patient/caregiver able to verbalize understanding.  Discharge Medication: Allergies as of 04/28/2022   No Known Allergies      Medication List     STOP taking these medications    ergocalciferol 1.25 MG (50000 UT) capsule Commonly known as: VITAMIN D2   furosemide 40 MG tablet Commonly known as: LASIX   HYDROcodone-acetaminophen 5-325 MG tablet Commonly known as: Norco   potassium chloride SA 20 MEQ tablet Commonly known as: KLOR-CON M   Repatha SureClick [id number] MG/ML Soaj Generic drug: Evolocumab       TAKE these medications    albuterol 108 (90 Base) MCG/ACT inhaler Commonly known as: VENTOLIN HFA Inhale 2 puffs into the lungs every 6 (six) hours as needed for wheezing or shortness of breath.   amLODipine 5 MG tablet Commonly known as: NORVASC Take 1 tablet (5 mg total) by mouth daily.   atorvastatin 80 MG tablet Commonly known as: LIPITOR Take 1 tablet (80 mg total) by mouth every evening.   Breztri Aerosphere 160-9-4.8 MCG/ACT Aero Generic drug: Budeson-Glycopyrrol-Formoterol Inhale 2 puffs into the lungs 2 (two) times daily.   carvedilol 12.5 MG tablet Commonly known as: COREG Take 1 tablet (12.5 mg total) by mouth 2 (two) times daily with a meal.   clopidogrel 75 MG tablet Commonly known as: PLAVIX Take 1 tablet (75 mg total) by mouth daily.   Entresto 24-26 MG Generic drug: sacubitril-valsartan Take 1 tablet by mouth 2 (two) times daily.   isosorbide mononitrate 60 MG 24 hr tablet Commonly known as: IMDUR Take 1 tablet (60 mg total) by mouth every evening.        Discharge Assessment: Vitals:   04/28/22 0719 04/28/22 0912  BP:  (!) 156/72  Pulse:  63  Resp:    Temp:    SpO2: 98%    Skin clean, dry and intact without evidence of skin break down, no evidence of skin tears noted. IV catheter  discontinued intact. Site without signs and symptoms of complications - no redness or edema noted at insertion site, patient denies c/o pain - only slight tenderness at site.  Dressing with slight pressure applied.  D/c Instructions-Education: Discharge instructions given to patient/family with verbalized understanding. D/c education completed with patient/family including follow up instructions, medication list, d/c activities limitations if indicated, with other d/c instructions as indicated by MD - patient able to verbalize understanding, all questions fully answered. Patient instructed to return to ED, call 911, or call MD for any changes in condition.  Patient escorted via [place], and D/C home via private auto.  Kathie Rhodes, RN 04/28/2022 11:12 AM

## 2022-04-28 NOTE — ED Provider Triage Note (Signed)
Emergency Medicine Provider Triage Evaluation Note  Michael [name] , a 67 y.o. male  was evaluated in triage.  Pt complains of shortness of breath.  Patient was just admitted to [facility] discharge today for COPD exacerbation.  Patient came here earlier today and had unremarkable chest x-ray so was sent back to [facility].  Patient came back here stating that he has shortness of breath.  Denies any active chest pain.  Review of Systems  Positive: Shortness of breath  Negative: Fever or chest pain   Physical Exam  BP 136/76 (BP Location: Right Arm)   Pulse 63   Temp 97.8 F (36.6 C) (Oral)   Resp 20   Ht 6' (1.829 m)   Wt 65.8 kg   SpO2 100%   BMI 19.67 kg/m  Gen:   Awake, no distress   Resp:  Slightly tachypneic  MSK:   Moves extremities without difficulty  Other:    Medical Decision Making  Medically screening exam initiated at 10:43 PM.  Appropriate orders placed.  Michael [name] was informed that the remainder of the evaluation will be completed by another provider, this initial triage assessment does not replace that evaluation, and the importance of remaining in the ED until their evaluation is complete.  Michael [name] is a 67 y.o. male here presenting with shortness of breath.  Patient has history of COPD.  I noticed that he has some EKG changes with some new T wave inversion.  Will get CBC and CMP and troponin and chest x-ray.     Drenda Freeze, MD 04/28/22 2245

## 2022-04-28 NOTE — Discharge Instructions (Signed)
Use your inhalers as directed °

## 2022-04-29 ENCOUNTER — Emergency Department (HOSPITAL_COMMUNITY): Payer: 59

## 2022-04-29 ENCOUNTER — Observation Stay (HOSPITAL_COMMUNITY)
Admission: EM | Admit: 2022-04-29 | Discharge: 2022-05-03 | Disposition: A | Discharge status: Home / Self Care | Payer: 59 | Attending: Internal Medicine | Admitting: Internal Medicine

## 2022-04-29 ENCOUNTER — Encounter (HOSPITAL_COMMUNITY): Payer: Self-pay

## 2022-04-29 ENCOUNTER — Other Ambulatory Visit: Payer: Self-pay

## 2022-04-29 DIAGNOSIS — I219 Acute myocardial infarction, unspecified: Secondary | ICD-10-CM | POA: Diagnosis present

## 2022-04-29 DIAGNOSIS — I5022 Chronic systolic (congestive) heart failure: Secondary | ICD-10-CM | POA: Diagnosis present

## 2022-04-29 DIAGNOSIS — I2089 Other forms of angina pectoris: Secondary | ICD-10-CM | POA: Diagnosis present

## 2022-04-29 DIAGNOSIS — R7989 Other specified abnormal findings of blood chemistry: Secondary | ICD-10-CM | POA: Diagnosis not present

## 2022-04-29 DIAGNOSIS — Z720 Tobacco use: Secondary | ICD-10-CM | POA: Diagnosis present

## 2022-04-29 DIAGNOSIS — F17200 Nicotine dependence, unspecified, uncomplicated: Secondary | ICD-10-CM | POA: Diagnosis present

## 2022-04-29 DIAGNOSIS — R0602 Shortness of breath: Secondary | ICD-10-CM | POA: Diagnosis not present

## 2022-04-29 DIAGNOSIS — I10 Essential (primary) hypertension: Secondary | ICD-10-CM | POA: Diagnosis present

## 2022-04-29 DIAGNOSIS — R55 Syncope and collapse: Secondary | ICD-10-CM | POA: Diagnosis not present

## 2022-04-29 DIAGNOSIS — N1832 Chronic kidney disease, stage 3b: Secondary | ICD-10-CM | POA: Diagnosis not present

## 2022-04-29 DIAGNOSIS — Z79899 Other long term (current) drug therapy: Secondary | ICD-10-CM | POA: Insufficient documentation

## 2022-04-29 DIAGNOSIS — Z7902 Long term (current) use of antithrombotics/antiplatelets: Secondary | ICD-10-CM | POA: Insufficient documentation

## 2022-04-29 DIAGNOSIS — Z23 Encounter for immunization: Secondary | ICD-10-CM | POA: Insufficient documentation

## 2022-04-29 DIAGNOSIS — J449 Chronic obstructive pulmonary disease, unspecified: Secondary | ICD-10-CM | POA: Insufficient documentation

## 2022-04-29 DIAGNOSIS — I130 Hypertensive heart and chronic kidney disease with heart failure and stage 1 through stage 4 chronic kidney disease, or unspecified chronic kidney disease: Secondary | ICD-10-CM | POA: Diagnosis not present

## 2022-04-29 DIAGNOSIS — Z743 Need for continuous supervision: Secondary | ICD-10-CM | POA: Diagnosis not present

## 2022-04-29 DIAGNOSIS — I6521 Occlusion and stenosis of right carotid artery: Secondary | ICD-10-CM | POA: Diagnosis present

## 2022-04-29 DIAGNOSIS — I2510 Atherosclerotic heart disease of native coronary artery without angina pectoris: Secondary | ICD-10-CM | POA: Diagnosis not present

## 2022-04-29 DIAGNOSIS — R891 Abnormal level of hormones in specimens from other organs, systems and tissues: Secondary | ICD-10-CM

## 2022-04-29 DIAGNOSIS — R339 Retention of urine, unspecified: Secondary | ICD-10-CM | POA: Diagnosis present

## 2022-04-29 DIAGNOSIS — R6889 Other general symptoms and signs: Secondary | ICD-10-CM | POA: Diagnosis not present

## 2022-04-29 DIAGNOSIS — I779 Disorder of arteries and arterioles, unspecified: Secondary | ICD-10-CM | POA: Diagnosis present

## 2022-04-29 DIAGNOSIS — R011 Cardiac murmur, unspecified: Secondary | ICD-10-CM | POA: Diagnosis present

## 2022-04-29 DIAGNOSIS — D649 Anemia, unspecified: Secondary | ICD-10-CM | POA: Diagnosis present

## 2022-04-29 DIAGNOSIS — D638 Anemia in other chronic diseases classified elsewhere: Secondary | ICD-10-CM | POA: Diagnosis present

## 2022-04-29 DIAGNOSIS — Z8589 Personal history of malignant neoplasm of other organs and systems: Secondary | ICD-10-CM | POA: Diagnosis not present

## 2022-04-29 DIAGNOSIS — J441 Chronic obstructive pulmonary disease with (acute) exacerbation: Secondary | ICD-10-CM | POA: Diagnosis present

## 2022-04-29 DIAGNOSIS — N183 Chronic kidney disease, stage 3 unspecified: Secondary | ICD-10-CM | POA: Diagnosis present

## 2022-04-29 DIAGNOSIS — S0990XA Unspecified injury of head, initial encounter: Secondary | ICD-10-CM | POA: Diagnosis not present

## 2022-04-29 DIAGNOSIS — Z87891 Personal history of nicotine dependence: Secondary | ICD-10-CM | POA: Insufficient documentation

## 2022-04-29 DIAGNOSIS — I5042 Chronic combined systolic (congestive) and diastolic (congestive) heart failure: Secondary | ICD-10-CM | POA: Diagnosis present

## 2022-04-29 LAB — BASIC METABOLIC PANEL
Anion gap: 12 (ref 5–15)
BUN: 31 mg/dL — ABNORMAL HIGH (ref 8–23)
CO2: 21 mmol/L — ABNORMAL LOW (ref 22–32)
Calcium: 8.9 mg/dL (ref 8.9–10.3)
Chloride: 105 mmol/L (ref 98–111)
Creatinine, Ser: 2.02 mg/dL — ABNORMAL HIGH (ref 0.61–1.24)
GFR, Estimated: 35 mL/min — ABNORMAL LOW (ref >60)
Glucose, Bld: 118 mg/dL — ABNORMAL HIGH (ref 70–99)
Potassium: 4 mmol/L (ref 3.5–5.1)
Sodium: 138 mmol/L (ref 135–145)

## 2022-04-29 LAB — CBC WITH DIFFERENTIAL/PLATELET
Abs Immature Granulocytes: 0.04 10*3/uL (ref 0.00–0.07)
Basophils Absolute: 0.1 10*3/uL (ref 0.0–0.1)
Basophils Relative: 1 %
Eosinophils Absolute: 0 10*3/uL (ref 0.0–0.5)
Eosinophils Relative: 0 %
HCT: 43.1 % (ref 39.0–52.0)
Hemoglobin: 13.5 g/dL (ref 13.0–17.0)
Immature Granulocytes: 1 %
Lymphocytes Relative: 7 %
Lymphs Abs: 0.6 10*3/uL — ABNORMAL LOW (ref 0.7–4.0)
MCH: 28.2 pg (ref 26.0–34.0)
MCHC: 31.3 g/dL (ref 30.0–36.0)
MCV: 90.2 fL (ref 80.0–100.0)
Monocytes Absolute: 0.1 10*3/uL (ref 0.1–1.0)
Monocytes Relative: 1 %
Neutro Abs: 7.8 10*3/uL — ABNORMAL HIGH (ref 1.7–7.7)
Neutrophils Relative %: 90 %
Platelets: 237 10*3/uL (ref 150–400)
RBC: 4.78 MIL/uL (ref 4.22–5.81)
RDW: 15.1 % (ref 11.5–15.5)
WBC: 8.7 10*3/uL (ref 4.0–10.5)
nRBC: 0 % (ref 0.0–0.2)

## 2022-04-29 LAB — CREATININE, SERUM
Creatinine, Ser: 2.2 mg/dL — ABNORMAL HIGH (ref 0.61–1.24)
GFR, Estimated: 32 mL/min — ABNORMAL LOW (ref >60)

## 2022-04-29 LAB — COMPREHENSIVE METABOLIC PANEL
ALT: 29 U/L (ref 0–44)
AST: 39 U/L (ref 15–41)
Albumin: 3.4 g/dL — ABNORMAL LOW (ref 3.5–5.0)
Alkaline Phosphatase: 159 U/L — ABNORMAL HIGH (ref 38–126)
Anion gap: 12 (ref 5–15)
BUN: 24 mg/dL — ABNORMAL HIGH (ref 8–23)
CO2: 22 mmol/L (ref 22–32)
Calcium: 9.1 mg/dL (ref 8.9–10.3)
Chloride: 105 mmol/L (ref 98–111)
Creatinine, Ser: 2.2 mg/dL — ABNORMAL HIGH (ref 0.61–1.24)
GFR, Estimated: 32 mL/min — ABNORMAL LOW (ref >60)
Glucose, Bld: 155 mg/dL — ABNORMAL HIGH (ref 70–99)
Potassium: 5.3 mmol/L — ABNORMAL HIGH (ref 3.5–5.1)
Sodium: 139 mmol/L (ref 135–145)
Total Bilirubin: 1.1 mg/dL (ref 0.3–1.2)
Total Protein: 6.8 g/dL (ref 6.5–8.1)

## 2022-04-29 LAB — TSH: TSH: 0.524 u[IU]/mL (ref 0.350–4.500)

## 2022-04-29 LAB — CBC
HCT: 37.2 % — ABNORMAL LOW (ref 39.0–52.0)
Hemoglobin: 11.8 g/dL — ABNORMAL LOW (ref 13.0–17.0)
MCH: 28.4 pg (ref 26.0–34.0)
MCHC: 31.7 g/dL (ref 30.0–36.0)
MCV: 89.6 fL (ref 80.0–100.0)
Platelets: 236 10*3/uL (ref 150–400)
RBC: 4.15 MIL/uL — ABNORMAL LOW (ref 4.22–5.81)
RDW: 15.2 % (ref 11.5–15.5)
WBC: 5.8 10*3/uL (ref 4.0–10.5)
nRBC: 0 % (ref 0.0–0.2)

## 2022-04-29 LAB — TROPONIN I (HIGH SENSITIVITY)
Troponin I (High Sensitivity): 60 ng/L — ABNORMAL HIGH (ref <18)
Troponin I (High Sensitivity): 66 ng/L — ABNORMAL HIGH (ref <18)
Troponin I (High Sensitivity): 53 ng/L — ABNORMAL HIGH (ref <18)
Troponin I (High Sensitivity): 44 ng/L — ABNORMAL HIGH (ref <18)

## 2022-04-29 LAB — BRAIN NATRIURETIC PEPTIDE
B Natriuretic Peptide: 1693.5 pg/mL — ABNORMAL HIGH (ref 0.0–100.0)
B Natriuretic Peptide: 2225 pg/mL — ABNORMAL HIGH (ref 0.0–100.0)

## 2022-04-29 MED ORDER — BUDESON-GLYCOPYRROL-FORMOTEROL 160-9-4.8 MCG/ACT IN AERO: 2.0000 | INHALATION_SPRAY | Freq: Two times a day (BID) | RESPIRATORY_TRACT | Status: DC

## 2022-04-29 MED ORDER — ONDANSETRON HCL 4 MG PO TABS: 4.0000 mg | ORAL_TABLET | Freq: Four times a day (QID) | ORAL | Status: DC | PRN

## 2022-04-29 MED ORDER — ISOSORBIDE MONONITRATE ER 60 MG PO TB24
60.0000 mg | ORAL_TABLET | Freq: Every evening | ORAL | Status: DC
  Administered 2022-04-29 – 2022-05-02 (×4): 60 mg via ORAL
  Filled 2022-04-29 (×4): qty 1

## 2022-04-29 MED ORDER — ATORVASTATIN CALCIUM 40 MG PO TABS
80.0000 mg | ORAL_TABLET | Freq: Every evening | ORAL | Status: DC
  Administered 2022-04-29 – 2022-05-02 (×4): 80 mg via ORAL
  Filled 2022-04-29 (×4): qty 2

## 2022-04-29 MED ORDER — ENOXAPARIN SODIUM 40 MG/0.4ML IJ SOSY
40.0000 mg | PREFILLED_SYRINGE | INTRAMUSCULAR | Status: DC
  Administered 2022-04-29 – 2022-05-02 (×4): 40 mg via SUBCUTANEOUS
  Filled 2022-04-29 (×4): qty 0.4

## 2022-04-29 MED ORDER — PREDNISONE 20 MG PO TABS: ORAL_TABLET | ORAL | 0 refills | Status: DC

## 2022-04-29 MED ORDER — ONDANSETRON HCL 4 MG/2ML IJ SOLN: 4.0000 mg | Freq: Four times a day (QID) | INTRAMUSCULAR | Status: DC | PRN

## 2022-04-29 MED ORDER — ACETAMINOPHEN 650 MG RE SUPP: 650.0000 mg | Freq: Four times a day (QID) | RECTAL | Status: DC | PRN

## 2022-04-29 MED ORDER — FUROSEMIDE 20 MG PO TABS
40.0000 mg | ORAL_TABLET | Freq: Once | ORAL | Status: AC
  Administered 2022-04-29: 40 mg via ORAL
  Filled 2022-04-29: qty 2

## 2022-04-29 MED ORDER — SACUBITRIL-VALSARTAN 24-26 MG PO TABS
1.0000 | ORAL_TABLET | Freq: Two times a day (BID) | ORAL | Status: DC
  Administered 2022-04-29 – 2022-04-30 (×2): 1 via ORAL
  Filled 2022-04-29 (×2): qty 1

## 2022-04-29 MED ORDER — CARVEDILOL 12.5 MG PO TABS
12.5000 mg | ORAL_TABLET | Freq: Every day | ORAL | Status: DC
  Administered 2022-04-29 – 2022-04-30 (×2): 12.5 mg via ORAL
  Filled 2022-04-29 (×2): qty 1

## 2022-04-29 MED ORDER — DOCUSATE SODIUM 100 MG PO CAPS
100.0000 mg | ORAL_CAPSULE | Freq: Two times a day (BID) | ORAL | Status: DC
  Administered 2022-05-01 – 2022-05-02 (×3): 100 mg via ORAL
  Filled 2022-04-29 (×6): qty 1

## 2022-04-29 MED ORDER — PNEUMOCOCCAL 20-VAL CONJ VACC 0.5 ML IM SUSY
0.5000 mL | PREFILLED_SYRINGE | INTRAMUSCULAR | Status: AC
  Administered 2022-05-01: 0.5 mL via INTRAMUSCULAR
  Filled 2022-04-29: qty 0.5

## 2022-04-29 MED ORDER — ACETAMINOPHEN 325 MG PO TABS: 650.0000 mg | ORAL_TABLET | Freq: Four times a day (QID) | ORAL | Status: DC | PRN

## 2022-04-29 MED ORDER — METOPROLOL TARTRATE 5 MG/5ML IV SOLN
5.0000 mg | Freq: Four times a day (QID) | INTRAVENOUS | Status: DC | PRN
  Filled 2022-04-29: qty 5

## 2022-04-29 MED ORDER — ALBUTEROL SULFATE (2.5 MG/3ML) 0.083% IN NEBU: 2.5000 mg | INHALATION_SOLUTION | RESPIRATORY_TRACT | Status: DC | PRN

## 2022-04-29 MED ORDER — BUDESONIDE 0.25 MG/2ML IN SUSP
0.2500 mg | Freq: Two times a day (BID) | RESPIRATORY_TRACT | Status: DC
  Administered 2022-04-29 – 2022-05-03 (×8): 0.25 mg via RESPIRATORY_TRACT
  Filled 2022-04-29 (×8): qty 2

## 2022-04-29 MED ORDER — PREDNISONE 20 MG PO TABS
60.0000 mg | ORAL_TABLET | Freq: Once | ORAL | Status: AC
  Administered 2022-04-29: 60 mg via ORAL
  Filled 2022-04-29: qty 3

## 2022-04-29 MED ORDER — UMECLIDINIUM BROMIDE 62.5 MCG/ACT IN AEPB
1.0000 | INHALATION_SPRAY | Freq: Every day | RESPIRATORY_TRACT | Status: DC
  Administered 2022-04-30 – 2022-05-03 (×4): 1 via RESPIRATORY_TRACT
  Filled 2022-04-29: qty 7

## 2022-04-29 MED ORDER — POLYETHYLENE GLYCOL 3350 17 G PO PACK: 17.0000 g | PACK | Freq: Every day | ORAL | Status: DC | PRN

## 2022-04-29 MED ORDER — CLOPIDOGREL BISULFATE 75 MG PO TABS
75.0000 mg | ORAL_TABLET | Freq: Every day | ORAL | Status: DC
  Administered 2022-04-29 – 2022-05-03 (×5): 75 mg via ORAL
  Filled 2022-04-29 (×5): qty 1

## 2022-04-29 MED ORDER — DOCUSATE SODIUM 100 MG PO CAPS: 100.0000 mg | ORAL_CAPSULE | Freq: Two times a day (BID) | ORAL | Status: DC

## 2022-04-29 MED ORDER — ARFORMOTEROL TARTRATE 15 MCG/2ML IN NEBU
15.0000 ug | INHALATION_SOLUTION | Freq: Two times a day (BID) | RESPIRATORY_TRACT | Status: DC
  Administered 2022-04-29 – 2022-05-03 (×8): 15 ug via RESPIRATORY_TRACT
  Filled 2022-04-29 (×8): qty 2

## 2022-04-29 NOTE — ED Triage Notes (Signed)
Patient BIB [facility]. Was found on the grass. Said he had a "syncopal episode." Bystander called. Discharged from [facility] at 6:50am.

## 2022-04-29 NOTE — ED Provider Notes (Signed)
[name] EMERGENCY DEPARTMENT AT [facility] Provider Note   CSN: [id number] Arrival date & time: 04/29/22  1000     History  Chief Complaint  Patient presents with   Loss of Consciousness    Michael [name] is a 67 y.o. male.  67 y.o. male with medical history significant of hypertension, hyperlipidemia, COPD, chronic normocytic anemia, head and neck cancer status post partial face and nose resection and construction, CAD, chronic HFrEF, CKD 3B   Patient discharged to [facility] about 3 hours ago.  He was seen yesterday twice for shortness of breath.  Bystander called EMS after finding patient on the sidewalk on [address].  Patient states he "passed out".  Remembers leaving [facility] and walking on the sidewalk without eating or drinking anything.  Began to feel lightheaded and dizzy without room spinning.  No chest pain or shortness of breath.  Feels like his breathing is at baseline and better than yesterday.  Denies any diaphoresis.  Denies any sudden onset chest pain or shortness of breath.  Denies hitting his head and denies any pain.  Denies taking any blood thinners.  He is prescribed Plavix.  Denies head, neck, back, chest or abdominal pain.  No focal weakness, numbness or tingling. States he has passed out previously.  The history is provided by the patient and the EMS personnel.  Loss of Consciousness Associated symptoms: shortness of breath   Associated symptoms: no fever, no nausea and no vomiting        Home Medications Prior to Admission medications   Medication Sig Start Date End Date Taking? Authorizing Provider  albuterol (VENTOLIN HFA) 108 (90 Base) MCG/ACT inhaler Inhale 2 puffs into the lungs every 6 (six) hours as needed for wheezing or shortness of breath. 04/27/22   Shahmehdi, Seyed A, MD  amLODipine (NORVASC) 5 MG tablet Take 1 tablet (5 mg total) by mouth daily. 04/27/22   Shahmehdi, Valeria Batman, MD  atorvastatin (LIPITOR) 80 MG tablet Take 1  tablet (80 mg total) by mouth every evening. 04/27/22   Shahmehdi, Valeria Batman, MD  Budeson-Glycopyrrol-Formoterol (BREZTRI AEROSPHERE) 160-9-4.8 MCG/ACT AERO Inhale 2 puffs into the lungs 2 (two) times daily.    [provider]  carvedilol (COREG) 12.5 MG tablet Take 1 tablet (12.5 mg total) by mouth 2 (two) times daily with a meal. 04/27/22   Shahmehdi, Valeria Batman, MD  clopidogrel (PLAVIX) 75 MG tablet Take 1 tablet (75 mg total) by mouth daily. 04/27/22   Shahmehdi, Valeria Batman, MD  isosorbide mononitrate (IMDUR) 60 MG 24 hr tablet Take 1 tablet (60 mg total) by mouth every evening. 04/27/22   Deatra James, MD  predniSONE (DELTASONE) 20 MG tablet 2 tabs po daily x 4 days 04/29/22   Mesner, Corene Cornea, MD  sacubitril-valsartan (ENTRESTO) 24-26 MG Take 1 tablet by mouth 2 (two) times daily. 04/27/22   Deatra James, MD      Allergies    Patient has no known allergies.    Review of Systems   Review of Systems  Constitutional:  Negative for activity change, appetite change and fever.  HENT:  Negative for congestion and rhinorrhea.   Respiratory:  Positive for shortness of breath. Negative for cough and chest tightness.   Cardiovascular:  Positive for syncope.  Gastrointestinal:  Negative for abdominal pain, nausea and vomiting.  Genitourinary:  Negative for dysuria and hematuria.  Musculoskeletal:  Negative for arthralgias and myalgias.  Skin:  Negative for rash.  Neurological:  Positive  for syncope.   all other systems are negative except as noted in the HPI and PMH.    Physical Exam Updated Vital Signs BP (!) 176/68   Pulse 71   Temp 98.1 F (36.7 C) (Oral)   Resp 18   Ht 6' (1.829 m)   Wt 66 kg   SpO2 100%   BMI 19.73 kg/m  Physical Exam Vitals and nursing note reviewed.  Constitutional:      General: He is not in acute distress.    Appearance: He is well-developed.  HENT:     Head: Normocephalic and atraumatic.     Comments: Chronic changes from previous reconstructive  surgery    Mouth/Throat:     Pharynx: No oropharyngeal exudate.  Eyes:     Conjunctiva/sclera: Conjunctivae normal.     Pupils: Pupils are equal, round, and reactive to light.  Neck:     Comments: No C spine tenderness Cardiovascular:     Rate and Rhythm: Normal rate and regular rhythm.     Heart sounds: Normal heart sounds. No murmur heard. Pulmonary:     Effort: Pulmonary effort is normal. No respiratory distress.     Breath sounds: Wheezing present.  Abdominal:     Palpations: Abdomen is soft.     Tenderness: There is no abdominal tenderness. There is no guarding or rebound.  Musculoskeletal:        General: No tenderness. Normal range of motion.     Cervical back: Normal range of motion and neck supple.  Skin:    General: Skin is warm.  Neurological:     Mental Status: He is alert and oriented to person, place, and time.     Cranial Nerves: No cranial nerve deficit.     Motor: No abnormal muscle tone.     Coordination: Coordination normal.     Comments:  5/5 strength throughout. CN 2-12 intact.Equal grip strength.   Psychiatric:        Behavior: Behavior normal.     ED Results / Procedures / Treatments   Labs (all labs ordered are listed, but only abnormal results are displayed) Labs Reviewed  CBC WITH DIFFERENTIAL/PLATELET - Abnormal; Notable for the following components:      Result Value   Neutro Abs 7.8 (*)    Lymphs Abs 0.6 (*)    All other components within normal limits  BASIC METABOLIC PANEL - Abnormal; Notable for the following components:   CO2 21 (*)    Glucose, Bld 118 (*)    BUN 31 (*)    Creatinine, Ser 2.02 (*)    GFR, Estimated 35 (*)    All other components within normal limits  BRAIN NATRIURETIC PEPTIDE - Abnormal; Notable for the following components:   B Natriuretic Peptide 2,225.0 (*)    All other components within normal limits  TROPONIN I (HIGH SENSITIVITY) - Abnormal; Notable for the following components:   Troponin I (High Sensitivity)  53 (*)    All other components within normal limits  TROPONIN I (HIGH SENSITIVITY) - Abnormal; Notable for the following components:   Troponin I (High Sensitivity) 44 (*)    All other components within normal limits  CBC  CREATININE, SERUM  TSH    EKG EKG Interpretation  Date/Time:  Friday April 29 2022 10:24:01 EDT Ventricular Rate:  69 PR Interval:  198 QRS Duration: 125 QT Interval:  434 QTC Calculation: 465 R Axis:   35 Text Interpretation: Sinus rhythm Multiple premature complexes, vent & supraven  LVH with secondary repolarization abnormality Anterior infarct, old No significant change was found Confirmed by Ezequiel Essex [phone]) on 04/29/2022 10:26:21 AM  Radiology DG Chest 2 View  Result Date: 04/28/2022 CLINICAL DATA:  cough, SOB EXAM: CHEST - 2 VIEW COMPARISON:  Chest x-ray 04/28/2022, CT chest 04/25/2022 FINDINGS: The heart and mediastinal contours are unchanged. Aortic calcification. Chronic right Base subcentimeter calcification likely granuloma. No focal consolidation. No pulmonary edema. No pleural effusion. No pneumothorax. No acute osseous abnormality. IMPRESSION: 1. No active cardiopulmonary disease. 2.  Aortic Atherosclerosis (ICD10-I70.0). Electronically Signed   By: Iven Finn M.D.   On: 04/28/2022 23:23   DG Chest Port 1 View  Result Date: 04/28/2022 CLINICAL DATA:  Shortness of breath. EXAM: PORTABLE CHEST 1 VIEW COMPARISON:  04/25/2018 for FINDINGS: The cardiac silhouette, mediastinal and contours are within normal limits given the AP projection and portable technique. Stable underlying emphysematous changes and pulmonary scarring. No acute overlying pulmonary process. No pleural effusion or pneumothorax. IMPRESSION: Emphysematous changes and pulmonary scarring but no acute overlying pulmonary process. Electronically Signed   By: Marijo Sanes M.D.   On: 04/28/2022 14:58    Procedures Procedures    Medications Ordered in ED Medications - No data to  display  ED Course/ Medical Decision Making/ A&P                             Medical Decision Making Amount and/or Complexity of Data Reviewed Independent Historian: EMS Labs: ordered. Decision-making details documented in ED Course. Radiology: ordered and independent interpretation performed. Decision-making details documented in ED Course. ECG/medicine tests: ordered and independent interpretation performed. Decision-making details documented in ED Course.  Risk Decision regarding hospitalization.   Patient with multiple evaluations for COPD yesterday presenting with loss of consciousness and possible syncopal episode.  He denies any symptoms currently.  Vitals are stable, no distress.  Denies any injury from falling.  He is not wheezing and not hypoxic.  Will obtain labs and EKG.  Echocardiogram in September 2023 showed diffuse hypokinesis with a EF of [id number] and diastolic dysfunction.  Did not show any evidence of aortic stenosis.  Did have mild mitral valve regurgitation. Cardiac catheterization in September 2023 showed moderate obstructive coronary disease in the RCA and marginal branch that was not intervened on. CT chest March 25 did not show any evidence of pulmonary embolism.  EKG similar to previous with LVH.  No Brugada, no prolonged QT.  T wave inversions laterally similar to previous Creatinine appears to be at baseline.  Troponin minimally elevated similar to previous.  Concern for syncope without prodrome.  Echocardiogram from last year as above showed diastolic dysfunction reduced ejection fraction.  Patient not low risk criteria based on [place] syncope score.  CT head is negative acute traumatic injury.  He denies headache, chest pain, neck pain, shortness of breath, abdominal pain.   Concern for syncope and he is high risk based on Faint score as well as [facility] syncope rule.  He does have a history of reduced ejection fraction, positive troponins and  abnormal EKG.  Observation admission discussed with Dr. Renaee Munda        Final Clinical Impression(s) / ED Diagnoses Final diagnoses:  Syncope and collapse    Rx / DC Orders ED Discharge Orders     None         Allessandra Bernardi, Annie Main, MD 04/29/22 1655

## 2022-04-29 NOTE — ED Provider Notes (Signed)
[facility] Provider Note   CSN: [id number] Arrival date & time: 04/28/22  2225     History  Chief Complaint  Patient presents with   Shortness of Breath    Michael [name] is a 67 y.o. male.  67 year old male with a past medical history of COPD that presents the ER today secondary to dyspnea. H/o same. Did not use his albuterol inhaler as he did not have it at the time. After using it, now feeling better. Nothing else changed since most recent visits.    Shortness of Breath      Home Medications Prior to Admission medications   Medication Sig Start Date End Date Taking? Authorizing Provider  predniSONE (DELTASONE) 20 MG tablet 2 tabs po daily x 4 days 04/29/22  Yes Geremiah Fussell, Corene Cornea, MD  albuterol (VENTOLIN HFA) 108 (90 Base) MCG/ACT inhaler Inhale 2 puffs into the lungs every 6 (six) hours as needed for wheezing or shortness of breath. 04/27/22   Shahmehdi, Seyed A, MD  amLODipine (NORVASC) 5 MG tablet Take 1 tablet (5 mg total) by mouth daily. 04/27/22   Shahmehdi, Valeria Batman, MD  atorvastatin (LIPITOR) 80 MG tablet Take 1 tablet (80 mg total) by mouth every evening. 04/27/22   Shahmehdi, Valeria Batman, MD  Budeson-Glycopyrrol-Formoterol (BREZTRI AEROSPHERE) 160-9-4.8 MCG/ACT AERO Inhale 2 puffs into the lungs 2 (two) times daily.    [provider]  carvedilol (COREG) 12.5 MG tablet Take 1 tablet (12.5 mg total) by mouth 2 (two) times daily with a meal. 04/27/22   Shahmehdi, Valeria Batman, MD  clopidogrel (PLAVIX) 75 MG tablet Take 1 tablet (75 mg total) by mouth daily. 04/27/22   Shahmehdi, Valeria Batman, MD  isosorbide mononitrate (IMDUR) 60 MG 24 hr tablet Take 1 tablet (60 mg total) by mouth every evening. 04/27/22   Shahmehdi, Valeria Batman, MD  sacubitril-valsartan (ENTRESTO) 24-26 MG Take 1 tablet by mouth 2 (two) times daily. 04/27/22   Deatra James, MD      Allergies    Patient has no known allergies.    Review of Systems   Review of Systems   Respiratory:  Positive for shortness of breath.     Physical Exam Updated Vital Signs BP (!) 153/78   Pulse 71   Temp 98.3 F (36.8 C) (Oral)   Resp 14   Ht 6' (1.829 m)   Wt 65.8 kg   SpO2 100%   BMI 19.67 kg/m  Physical Exam Vitals and nursing note reviewed.  Constitutional:      Appearance: He is well-developed.  HENT:     Head: Normocephalic and atraumatic.  Cardiovascular:     Rate and Rhythm: Normal rate.  Pulmonary:     Effort: Pulmonary effort is normal. No tachypnea or respiratory distress.     Breath sounds: No decreased breath sounds or rales.  Abdominal:     General: There is no distension.  Musculoskeletal:        General: Normal range of motion.     Cervical back: Normal range of motion.     Right lower leg: No edema.     Left lower leg: No edema.  Skin:    General: Skin is warm and dry.  Neurological:     Mental Status: He is alert.     ED Results / Procedures / Treatments   Labs (all labs ordered are listed, but only abnormal results are displayed) Labs Reviewed  COMPREHENSIVE METABOLIC PANEL - Abnormal; Notable  for the following components:      Result Value   Potassium 5.3 (*)    Glucose, Bld 155 (*)    BUN 24 (*)    Creatinine, Ser 2.20 (*)    Albumin 3.4 (*)    Alkaline Phosphatase 159 (*)    GFR, Estimated 32 (*)    All other components within normal limits  CBC WITH DIFFERENTIAL/PLATELET - Abnormal; Notable for the following components:   Hemoglobin 12.3 (*)    HCT 38.3 (*)    All other components within normal limits  BRAIN NATRIURETIC PEPTIDE - Abnormal; Notable for the following components:   B Natriuretic Peptide 1,693.5 (*)    All other components within normal limits  TROPONIN I (HIGH SENSITIVITY) - Abnormal; Notable for the following components:   Troponin I (High Sensitivity) 60 (*)    All other components within normal limits  TROPONIN I (HIGH SENSITIVITY) - Abnormal; Notable for the following components:   Troponin I  (High Sensitivity) 66 (*)    All other components within normal limits  CBC WITH DIFFERENTIAL/PLATELET    EKG EKG Interpretation  Date/Time:  Thursday April 28 2022 22:38:49 EDT Ventricular Rate:  64 PR Interval:  270 QRS Duration: 114 QT Interval:  468 QTC Calculation: 482 R Axis:   37 Text Interpretation: Sinus rhythm with 1st degree A-V block Left ventricular hypertrophy with repolarization abnormality ( Sokolow-Lyon , Cornell product ) Anteroseptal infarct , age undetermined Abnormal ECG When compared with ECG of 26-Apr-2022 02:02, PREVIOUS ECG IS PRESENT Confirmed by Merrily Pew [phone]) on 04/29/2022 12:12:41 AM  Radiology DG Chest 2 View  Result Date: 04/28/2022 CLINICAL DATA:  cough, SOB EXAM: CHEST - 2 VIEW COMPARISON:  Chest x-ray 04/28/2022, CT chest 04/25/2022 FINDINGS: The heart and mediastinal contours are unchanged. Aortic calcification. Chronic right Base subcentimeter calcification likely granuloma. No focal consolidation. No pulmonary edema. No pleural effusion. No pneumothorax. No acute osseous abnormality. IMPRESSION: 1. No active cardiopulmonary disease. 2.  Aortic Atherosclerosis (ICD10-I70.0). Electronically Signed   By: Iven Finn M.D.   On: 04/28/2022 23:23   DG Chest Port 1 View  Result Date: 04/28/2022 CLINICAL DATA:  Shortness of breath. EXAM: PORTABLE CHEST 1 VIEW COMPARISON:  04/25/2018 for FINDINGS: The cardiac silhouette, mediastinal and contours are within normal limits given the AP projection and portable technique. Stable underlying emphysematous changes and pulmonary scarring. No acute overlying pulmonary process. No pleural effusion or pneumothorax. IMPRESSION: Emphysematous changes and pulmonary scarring but no acute overlying pulmonary process. Electronically Signed   By: Marijo Sanes M.D.   On: 04/28/2022 14:58    Procedures Procedures    Medications Ordered in ED Medications  furosemide (LASIX) tablet 40 mg (40 mg Oral Given 04/29/22 0457)   predniSONE (DELTASONE) tablet 60 mg (60 mg Oral Given 04/29/22 0553)    ED Course/ Medical Decision Making/ A&P                             Medical Decision Making Amount and/or Complexity of Data Reviewed Labs: ordered.  Risk Prescription drug management.   Will initiate steroids. No hypoxia. Ambulates without tachypnea or hypoxia. No indication for further workup at this time.    Final Clinical Impression(s) / ED Diagnoses Final diagnoses:  COPD exacerbation (HCC)  Angina at rest  Chronic HFrEF (heart failure with reduced ejection fraction) ([place])    Rx / DC Orders ED Discharge Orders  Ordered    predniSONE (DELTASONE) 20 MG tablet        04/29/22 0549    Ambulatory referral to Cardiology        04/29/22 0550              Coltyn Hanning, Corene Cornea, MD 04/29/22 0710

## 2022-04-29 NOTE — H&P (Signed)
History and Physical  Michael [name] [id number] DOB: 07-04-1955 DOA: 04/29/2022  PCP: [name], MD   Chief Complaint: Syncope  HPI: Michael [name] is a 67 y.o. male with medical history significant for heart failure with reduced EF 30% based on last echo September 2023, CKD, COPD on room air, essential hypertension, history of head and neck cancer basal carcinoma status post resection with right partial rhinectomy and reconstruction.  He was recently admitted to the hospital with shortness of breath and found to have abnormal troponins, which were trended and flat.  Per cardiology recommendations patient was started on Entresto, Coreg, statin, Plavix, Imdur.  There was some question about the patient's medication noncompliance.  In any case, he was discharged from the hospital on 3/, but has returned to the ER multiple times since then with complaints of shortness of breath.  Earlier today at [facility] he presented to the ER with complaints of dyspnea, he did have an albuterol inhaler that was prescribed to him, he used it in the ER on felt much better.  He was discharged home with oral prednisone which she did not start taking yet.  Later in the morning he was walking on the street, when he started to feel dizzy and lightheaded.  Admits to not eating or drinking very much in the last few days.  He felt like he was going to blackout, so he laid down in the grass on the side of the road.  Denies significant dyspnea, cough, shortness of breath, denies any chest pain or palpitations.  No nausea or vomiting today or any other complaints.  Says that he woke up pretty promptly after passing out, was not confused.  Denies symptoms of vertigo.  He was helped by a passerby who called EMS and he was brought to the ER.  ED Course: In the emergency department, temperature was 97.5, blood pressure 136/76.  Lab work was done, CBC unremarkable, creatinine 2.02, was previously 2.2, 1.87, troponin today 53, and 44,  actually down from previous ER stay of 65 and previous hospital stay.  EKG was done, reviewed personally by me which shows sinus rhythm with frequent premature complexes, no significant ST changes.  No arrhythmias.  Due to the patient's known heart failure with reduced EF, hospitalist was requested to admit the patient for observation and further workup of his syncope.  Review of Systems: Please see HPI for pertinent positives and negatives. A complete 10 system review of systems are otherwise negative.  Past Medical History:  Diagnosis Date   Aortic atherosclerosis ([place]) 08/03/2021   CAD (coronary artery disease) 08/03/2021   Cardiac catheterization September 2023 with RCA/RV marginal stenosis managed medically   Cardiomyopathy ([place])    Carotid artery disease ([place])    CKD (chronic kidney disease) stage 3, GFR 30-59 ml/min (HCC)    COPD (chronic obstructive pulmonary disease) (HCC)    Essential hypertension    Head and neck cancer ([place]) 2019   Right facial basal cell carcinoma s/p resection with right partial mastectomy and partal rhinectomy with skin graft (06/13/17)   Iron deficiency anemia    Urinary retention    Past Surgical History:  Procedure Laterality Date   ANKLE CLOSED REDUCTION Right    open reduction   BASAL CELL CARCINOMA EXCISION  2019   at [facility]   BIOPSY  07/19/2021   Procedure: BIOPSY;  Surgeon: Eloise Harman, DO;  [facility];  Service: Endoscopy;;   COLONOSCOPY N/A 05/31/2017   Procedure:  COLONOSCOPY;  Surgeon: Daneil Dolin, MD;  [facility];  Service: Endoscopy;  Laterality: N/A;  2:45pm   COLONOSCOPY WITH PROPOFOL N/A 07/19/2021   Procedure: COLONOSCOPY WITH PROPOFOL;  Surgeon: Eloise Harman, DO;  [facility];  Service: Endoscopy;  Laterality: N/A;  3:00pm, moved up to 9:00   CYSTOSCOPY N/A 10/29/2018   Procedure: CYSTOSCOPY, CLOT EVACUATION;  Surgeon: Ceasar Mons, MD;  Location: [facility];  Service: Urology;   Laterality: N/A;   PARTIAL COLECTOMY Right 08/11/2021   Procedure: PARTIAL COLECTOMY, OPEN RIGHT HEMICOLECTOMY;  Surgeon: Virl Cagey, MD;  Location: [facility];  Service: General;  Laterality: Right;   POLYPECTOMY  05/31/2017   Procedure: POLYPECTOMY;  Surgeon: Daneil Dolin, MD;  [facility];  Service: Endoscopy;;   RIGHT/LEFT HEART CATH AND CORONARY ANGIOGRAPHY N/A 10/22/2021   Procedure: RIGHT/LEFT HEART CATH AND CORONARY ANGIOGRAPHY;  Surgeon: Early Osmond, MD;  Location: [facility];  Service: Cardiovascular;  Laterality: N/A;   TONSILLECTOMY     TRANSCAROTID ARTERY REVASCULARIZATION  Right 02/15/2021   Procedure: RIGHT TRANSCAROTID ARTERY REVASCULARIZATION;  Surgeon: [name] Heck, MD;  Location: [place];  Service: Vascular;  Laterality: Right;   ULTRASOUND GUIDANCE FOR VASCULAR ACCESS Left 02/15/2021   Procedure: ULTRASOUND GUIDANCE FOR VASCULAR ACCESS;  Surgeon: [name] Heck, MD;  Location: [place];  Service: Vascular;  Laterality: Left;   XI ROBOTIC ASSISTED SIMPLE PROSTATECTOMY N/A 10/29/2018   Procedure: XI ROBOTIC ASSISTED SIMPLE PROSTATECTOMY;  Surgeon: Cleon Gustin, MD;  Location: [facility];  Service: Urology;  Laterality: N/A;    Social History:  reports that he quit smoking about 9 months ago. His smoking use included cigarettes. He has a 20.00 pack-year smoking history. He has never been exposed to tobacco smoke. He has never used smokeless tobacco. He reports that he does not currently use alcohol. He reports that he does not use drugs.   No Known Allergies  Family History  Problem Relation Age of Onset   Stroke Father    Cirrhosis Mother    Colon cancer Neg Hx      Prior to Admission medications   Medication Sig Start Date End Date Taking? Authorizing Provider  albuterol (VENTOLIN HFA) 108 (90 Base) MCG/ACT inhaler Inhale 2 puffs into the lungs every 6 (six) hours as needed for wheezing or shortness of breath. 04/27/22   Shahmehdi,  Seyed A, MD  amLODipine (NORVASC) 5 MG tablet Take 1 tablet (5 mg total) by mouth daily. 04/27/22   Shahmehdi, Valeria Batman, MD  atorvastatin (LIPITOR) 80 MG tablet Take 1 tablet (80 mg total) by mouth every evening. 04/27/22   Shahmehdi, Valeria Batman, MD  Budeson-Glycopyrrol-Formoterol (BREZTRI AEROSPHERE) 160-9-4.8 MCG/ACT AERO Inhale 2 puffs into the lungs 2 (two) times daily.    [provider]  carvedilol (COREG) 12.5 MG tablet Take 1 tablet (12.5 mg total) by mouth 2 (two) times daily with a meal. 04/27/22   Shahmehdi, Valeria Batman, MD  clopidogrel (PLAVIX) 75 MG tablet Take 1 tablet (75 mg total) by mouth daily. 04/27/22   Shahmehdi, Valeria Batman, MD  isosorbide mononitrate (IMDUR) 60 MG 24 hr tablet Take 1 tablet (60 mg total) by mouth every evening. 04/27/22   Deatra James, MD  predniSONE (DELTASONE) 20 MG tablet 2 tabs po daily x 4 days 04/29/22   Mesner, Corene Cornea, MD  sacubitril-valsartan (ENTRESTO) 24-26 MG Take 1 tablet by mouth 2 (two) times daily. 04/27/22   Shahmehdi,  Valeria Batman, MD    Physical Exam: BP (!) 143/93   Pulse 69   Temp 98.1 F (36.7 C) (Oral)   Resp 18   Ht 6' (1.829 m)   Wt 66 kg   SpO2 99%   BMI 19.73 kg/m   General:  Alert, oriented, calm, in no acute distress, pleasant and cooperative Eyes: EOMI, clear conjuctivae, white sclerea he has postsurgical changes from his facial surgery on reconstruction. Neck: supple, no masses, trachea mildline  Cardiovascular: RRR, no murmurs or rubs, no peripheral edema  Respiratory: clear to auscultation bilaterally, no wheezes, no crackles  Abdomen: soft, nontender, nondistended, normal bowel tones heard  Skin: dry, no rashes  Musculoskeletal: no joint effusions, normal range of motion  Psychiatric: appropriate affect, normal speech  Neurologic: extraocular muscles intact, clear speech, moving all extremities with intact sensorium          Labs on Admission:  Basic Metabolic Panel: Recent Labs  Lab 04/25/22 2156 04/26/22 0152  04/27/22 0506 04/28/22 2252 04/29/22 1013  NA 136 136 138 139 138  K 2.7* 3.7 3.9 5.3* 4.0  CL 103 102 107 105 105  CO2 24 21* 24 22 21*  GLUCOSE 120* 99 100* 155* 118*  BUN 23 22 25* 24* 31*  CREATININE 2.03* 1.98* 1.87* 2.20* 2.02*  CALCIUM 8.6* 8.9 8.2* 9.1 8.9  MG 1.8  --  2.3  --   --   PHOS  --   --  2.8  --   --    Liver Function Tests: Recent Labs  Lab 04/25/22 2156 04/27/22 0506 04/28/22 2252  AST 35 30 39  ALT 28 28 29   ALKPHOS 168* 145* 159*  BILITOT 0.8 0.9 1.1  PROT 6.8 6.3* 6.8  ALBUMIN 3.5 3.1* 3.4*   No results for input(s): "LIPASE", "AMYLASE" in the last 168 hours. No results for input(s): "AMMONIA" in the last 168 hours. CBC: Recent Labs  Lab 04/25/22 2156 04/27/22 0506 04/28/22 2315 04/29/22 1013  WBC 10.5 7.1 7.5 8.7  NEUTROABS  --   --  5.0 7.8*  HGB 12.7* 11.8* 12.3* 13.5  HCT 39.2 37.1* 38.3* 43.1  MCV 86.7 89.0 88.5 90.2  PLT 188 181 223 237   Cardiac Enzymes: No results for input(s): "CKTOTAL", "CKMB", "CKMBINDEX", "TROPONINI" in the last 168 hours.  BNP (last 3 results) Recent Labs    11/13/21 1215 04/25/22 2156 04/29/22 0115  BNP 2,644.0* 2,310.0* 1,693.5*    ProBNP (last 3 results) No results for input(s): "PROBNP" in the last 8760 hours.  CBG: No results for input(s): "GLUCAP" in the last 168 hours.  Most Recent Echo 10/21/21: 1. LV function is depressed with diffuse hypokinesis in the lateral and  inferiror walls Compared to echo from 2019, LVEF is worse. . Left  ventricular ejection fraction, by estimation, is 30%. The left ventricle  has moderately decreased function. The  left ventricular internal cavity size was severely dilated. There is mild  left ventricular hypertrophy. Left ventricular diastolic parameters are  consistent with Grade III diastolic dysfunction (restrictive). Elevated  left atrial pressure.   2. Right ventricular systolic function is low normal. The right  ventricular size is normal. There is  normal pulmonary artery systolic  pressure.   3. Left atrial size was severely dilated.   4. Right atrial size was moderately dilated.   5. A small pericardial effusion is present. The pericardial effusion is  circumferential.   6. Mild mitral valve regurgitation.   7. The aortic  valve is tricuspid. Aortic valve regurgitation is not  visualized. Aortic valve sclerosis is present, with no evidence of aortic  valve stenosis.   8. The inferior vena cava dilated.    Radiological Exams on Admission: DG Chest 2 View  Result Date: 04/29/2022 CLINICAL DATA:  Shortness of breath. EXAM: CHEST - 2 VIEW COMPARISON:  April 28, 2022. FINDINGS: Stable cardiomediastinal silhouette. Hyperexpansion of the lungs is noted. No acute pulmonary disease is noted. Bony thorax is unremarkable. IMPRESSION: Hyperexpansion of the lungs. No acute cardiopulmonary abnormality seen. Aortic Atherosclerosis (ICD10-I70.0). Electronically Signed   By: Marijo Conception M.D.   On: 04/29/2022 12:26   DG Chest 2 View  Result Date: 04/28/2022 CLINICAL DATA:  cough, SOB EXAM: CHEST - 2 VIEW COMPARISON:  Chest x-ray 04/28/2022, CT chest 04/25/2022 FINDINGS: The heart and mediastinal contours are unchanged. Aortic calcification. Chronic right Base subcentimeter calcification likely granuloma. No focal consolidation. No pulmonary edema. No pleural effusion. No pneumothorax. No acute osseous abnormality. IMPRESSION: 1. No active cardiopulmonary disease. 2.  Aortic Atherosclerosis (ICD10-I70.0). Electronically Signed   By: Iven Finn M.D.   On: 04/28/2022 23:23   DG Chest Port 1 View  Result Date: 04/28/2022 CLINICAL DATA:  Shortness of breath. EXAM: PORTABLE CHEST 1 VIEW COMPARISON:  04/25/2018 for FINDINGS: The cardiac silhouette, mediastinal and contours are within normal limits given the AP projection and portable technique. Stable underlying emphysematous changes and pulmonary scarring. No acute overlying pulmonary process. No  pleural effusion or pneumothorax. IMPRESSION: Emphysematous changes and pulmonary scarring but no acute overlying pulmonary process. Electronically Signed   By: Marijo Sanes M.D.   On: 04/28/2022 14:58    Assessment/Plan This is a generally chronically ill gentleman with a history of known heart failure with reduced EF, coronary artery disease, carotid disease, COPD on room air, CKD, chronically elevated troponins, being admitted to the hospital after syncope.  Etiology of syncope is unclear, though story would be consistent with orthostatic hypotension and syncope.  Seizure, or primary cardiac event seem less likely given the lab work as mentioned above, and the fact that he had prodrome of feeling like he was going to pass out, and that he came back to consciousness without confusion quite quickly.   Syncope and collapse -observation admission with telemetry -Due to history of significant heart failure will not hydrate, does not look clinically dry -Will continue to trend third troponin, though it is flat and improved from previous values -Continue his current cardiac medications, but will hold amlodipine due to suspicion of orthostatic hypotension -Recheck orthostatics in the morning -Noted elevated BNP level, but this is also improving from previous -Will repeat cardiac echo since it has been 6 months Continue chronic home medications otherwise, anticipate the patient can likely be discharged from the hospital in the morning if echo unremarkable and he remained stable with no significant arrhythmia on telemetry Active Problems:   Essential hypertension   Anemia   Tobacco abuse   Heart murmur   Carotid artery disease (HCC)   COPD (chronic obstructive pulmonary disease) ([place])   Urinary retention with incomplete bladder emptying   Chronic HFrEF (heart failure with reduced ejection fraction) (HCC)   Carotid stenosis, asymptomatic, right   CAD (coronary artery disease)   CKD (chronic kidney  disease) stage 3, GFR 30-59 ml/min (HCC)   Angina at rest   Troponin level elevated   Elevated brain natriuretic peptide (BNP) level   Syncope   DVT prophylaxis: Lovenox     Code  Status: Prior  Consults called: None  Admission status: Observation  Time spent: 48 minutes  My Rinke Neva Seat MD [facility] Pager [phone]  If 7PM-7AM, please contact night-coverage [email or web address] Password [facility]  04/29/2022, 1:03 PM

## 2022-04-29 NOTE — ED Notes (Signed)
Pt ambulatory O2 sats between 95-98%

## 2022-04-29 NOTE — ED Notes (Signed)
ED TO INPATIENT HANDOFF REPORT  ED Nurse Name and Phone #: Lanny Hurst / [phone]  S Name/Age/Gender Michael [name] 67 y.o. male Room/Bed: WA16/WA16  Code Status   Code Status: Full Code  Home/SNF/Other  Patient oriented to: self, place, time, and situation Is this baseline? Yes   Triage Complete: Triage complete  Chief Complaint Syncope [R55]  Triage Note Patient BIB [facility]. Was found on the grass. Said he had a "syncopal episode." Bystander called. Discharged from [facility] at 6:50am.    Allergies No Known Allergies  Level of Care/Admitting Diagnosis ED Disposition     ED Disposition  Admit   Condition  --   Comment  Hospital Area: [facility] [[id number]]  Level of Care: Telemetry [5]  Admit to tele based on following criteria: Eval of Syncope  May place patient in observation at [facility] or [facility] if equivalent level of care is available:: Yes  Covid Evaluation: Asymptomatic - no recent exposure (last 10 days) testing not required  Diagnosis: Syncope [206001]  Admitting Physician: Lucillie Garfinkel [id number]  Attending Physician: Hollice Gong, MIR Kari.Conine [id number]          B Medical/Surgery History Past Medical History:  Diagnosis Date   Aortic atherosclerosis ([place]) 08/03/2021   CAD (coronary artery disease) 08/03/2021   Cardiac catheterization September 2023 with RCA/RV marginal stenosis managed medically   Cardiomyopathy ([place])    Carotid artery disease ([place])    CKD (chronic kidney disease) stage 3, GFR 30-59 ml/min (HCC)    COPD (chronic obstructive pulmonary disease) ([place])    Essential hypertension    Head and neck cancer ([place]) 2019   Right facial basal cell carcinoma s/p resection with right partial mastectomy and partal rhinectomy with skin graft (06/13/17)   Iron deficiency anemia    Urinary retention    Past Surgical History:  Procedure Laterality Date   ANKLE CLOSED REDUCTION Right    open reduction   BASAL CELL CARCINOMA EXCISION   2019   at [facility]   BIOPSY  07/19/2021   Procedure: BIOPSY;  Surgeon: Eloise Harman, DO;  [facility];  Service: Endoscopy;;   COLONOSCOPY N/A 05/31/2017   Procedure: COLONOSCOPY;  Surgeon: Daneil Dolin, MD;  [facility];  Service: Endoscopy;  Laterality: N/A;  2:45pm   COLONOSCOPY WITH PROPOFOL N/A 07/19/2021   Procedure: COLONOSCOPY WITH PROPOFOL;  Surgeon: Eloise Harman, DO;  [facility];  Service: Endoscopy;  Laterality: N/A;  3:00pm, moved up to 9:00   CYSTOSCOPY N/A 10/29/2018   Procedure: CYSTOSCOPY, CLOT EVACUATION;  Surgeon: Ceasar Mons, MD;  Location: [facility];  Service: Urology;  Laterality: N/A;   PARTIAL COLECTOMY Right 08/11/2021   Procedure: PARTIAL COLECTOMY, OPEN RIGHT HEMICOLECTOMY;  Surgeon: Virl Cagey, MD;  Location: [facility];  Service: General;  Laterality: Right;   POLYPECTOMY  05/31/2017   Procedure: POLYPECTOMY;  Surgeon: Daneil Dolin, MD;  [facility];  Service: Endoscopy;;   RIGHT/LEFT HEART CATH AND CORONARY ANGIOGRAPHY N/A 10/22/2021   Procedure: RIGHT/LEFT HEART CATH AND CORONARY ANGIOGRAPHY;  Surgeon: Early Osmond, MD;  Location: [facility];  Service: Cardiovascular;  Laterality: N/A;   TONSILLECTOMY     TRANSCAROTID ARTERY REVASCULARIZATION  Right 02/15/2021   Procedure: RIGHT TRANSCAROTID ARTERY REVASCULARIZATION;  Surgeon: [name] Heck, MD;  Location: [facility];  Service: Vascular;  Laterality: Right;   ULTRASOUND GUIDANCE FOR VASCULAR ACCESS Left 02/15/2021   Procedure: ULTRASOUND GUIDANCE FOR  VASCULAR ACCESS;  Surgeon: [name] Heck, MD;  Location: [facility];  Service: Vascular;  Laterality: Left;   XI ROBOTIC ASSISTED SIMPLE PROSTATECTOMY N/A 10/29/2018   Procedure: XI ROBOTIC ASSISTED SIMPLE PROSTATECTOMY;  Surgeon: Cleon Gustin, MD;  Location[facility];  Service: Urology;  Laterality: N/A;     A IV Location/Drains/Wounds Patient Lines/Drains/Airways Status      Active Line/Drains/Airways     None            Intake/Output Last 24 hours No intake or output data in the 24 hours ending 04/29/22 1506  Labs/Imaging Results for orders placed or performed during the hospital encounter of 04/29/22 (from the past 48 hour(s))  CBC with Differential     Status: Abnormal   Collection Time: 04/29/22 10:13 AM  Result Value Ref Range   WBC 8.7 4.0 - 10.5 K/uL   RBC 4.78 4.22 - 5.81 MIL/uL   Hemoglobin 13.5 13.0 - 17.0 g/dL   HCT 43.1 39.0 - 52.0 %   MCV 90.2 80.0 - 100.0 fL   MCH 28.2 26.0 - 34.0 pg   MCHC 31.3 30.0 - 36.0 g/dL   RDW 15.1 11.5 - 15.5 %   Platelets 237 150 - 400 K/uL   nRBC 0.0 0.0 - 0.2 %   Neutrophils Relative % 90 %   Neutro Abs 7.8 (H) 1.7 - 7.7 K/uL   Lymphocytes Relative 7 %   Lymphs Abs 0.6 (L) 0.7 - 4.0 K/uL   Monocytes Relative 1 %   Monocytes Absolute 0.1 0.1 - 1.0 K/uL   Eosinophils Relative 0 %   Eosinophils Absolute 0.0 0.0 - 0.5 K/uL   Basophils Relative 1 %   Basophils Absolute 0.1 0.0 - 0.1 K/uL   Immature Granulocytes 1 %   Abs Immature Granulocytes 0.04 0.00 - 0.07 K/uL    Comment: Performed at [facility], [place] [address] [place], [place] [id number]  Basic metabolic panel     Status: Abnormal   Collection Time: 04/29/22 10:13 AM  Result Value Ref Range   Sodium 138 135 - 145 mmol/L   Potassium 4.0 3.5 - 5.1 mmol/L   Chloride 105 98 - 111 mmol/L   CO2 21 (L) 22 - 32 mmol/L   Glucose, Bld 118 (H) 70 - 99 mg/dL    Comment: Glucose reference range applies only to samples taken after fasting for at least 8 hours.   BUN 31 (H) 8 - 23 mg/dL   Creatinine, Ser 2.02 (H) 0.61 - 1.24 mg/dL   Calcium 8.9 8.9 - 10.3 mg/dL   GFR, Estimated 35 (L) >60 mL/min    Comment: (NOTE) Calculated using the CKD-EPI Creatinine Equation (2021)    Anion gap 12 5 - 15    Comment: Performed at [facility], [place] [address], [place], [place] [address]  Troponin I (High Sensitivity)      Status: Abnormal   Collection Time: 04/29/22 10:13 AM  Result Value Ref Range   Troponin I (High Sensitivity) 53 (H) <18 ng/L    Comment: (NOTE) Elevated high sensitivity troponin I (hsTnI) values and significant  changes across serial measurements may suggest ACS but many other  chronic and acute conditions are known to elevate hsTnI results.  Refer to the "Links" section for chest pain algorithms and additional  guidance. Performed at [facility], [place] [address], [place], [place] [address]   Brain natriuretic peptide     Status: Abnormal   Collection  Time: 04/29/22 10:13 AM  Result Value Ref Range   B Natriuretic Peptide 2,225.0 (H) 0.0 - 100.0 pg/mL    Comment: Performed at [facility], [place] [address] [place], [place] [address]  Troponin I (High Sensitivity)     Status: Abnormal   Collection Time: 04/29/22 12:56 PM  Result Value Ref Range   Troponin I (High Sensitivity) 44 (H) <18 ng/L    Comment: (NOTE) Elevated high sensitivity troponin I (hsTnI) values and significant  changes across serial measurements may suggest ACS but many other  chronic and acute conditions are known to elevate hsTnI results.  Refer to the "Links" section for chest pain algorithms and additional  guidance. Performed at [facility], [place] [address], [place], [place] [address]    CT Head Wo Contrast  Result Date: 04/29/2022 CLINICAL DATA:  Trauma EXAM: CT HEAD WITHOUT CONTRAST TECHNIQUE: Contiguous axial images were obtained from the base of the skull through the vertex without intravenous contrast. RADIATION DOSE REDUCTION: This exam was performed according to the departmental dose-optimization program which includes automated exposure control, adjustment of the mA and/or kV according to patient size and/or use of iterative reconstruction technique. COMPARISON:  CT Head 12/17/19 FINDINGS: Brain: No evidence of acute infarction, hemorrhage,  hydrocephalus, extra-axial collection or mass lesion/mass effect. There is sequela of severe chronic microvascular ischemic change. Chronic infarcts in the left basal ganglia. Vascular: No hyperdense vessel or unexpected calcification. Skull: Postsurgical changes prior complex maxillofacial reconstruction. Sinuses/Orbits: Middle ear or mastoid. Paranasal sinuses are clear. There postsurgical changes from a prior orbital floor fracture repair. Orbits are otherwise unremarkable. Other: Likely an epidermal inclusion cyst along the left parietal scalp. IMPRESSION: 1. No acute intracranial abnormality. 2. Sequela of severe chronic microvascular ischemic change. Electronically Signed   By: Marin Roberts M.D.   On: 04/29/2022 13:22   DG Chest 2 View  Result Date: 04/29/2022 CLINICAL DATA:  Shortness of breath. EXAM: CHEST - 2 VIEW COMPARISON:  April 28, 2022. FINDINGS: Stable cardiomediastinal silhouette. Hyperexpansion of the lungs is noted. No acute pulmonary disease is noted. Bony thorax is unremarkable. IMPRESSION: Hyperexpansion of the lungs. No acute cardiopulmonary abnormality seen. Aortic Atherosclerosis (ICD10-I70.0). Electronically Signed   By: Marijo Conception M.D.   On: 04/29/2022 12:26   DG Chest 2 View  Result Date: 04/28/2022 CLINICAL DATA:  cough, SOB EXAM: CHEST - 2 VIEW COMPARISON:  Chest x-ray 04/28/2022, CT chest 04/25/2022 FINDINGS: The heart and mediastinal contours are unchanged. Aortic calcification. Chronic right Base subcentimeter calcification likely granuloma. No focal consolidation. No pulmonary edema. No pleural effusion. No pneumothorax. No acute osseous abnormality. IMPRESSION: 1. No active cardiopulmonary disease. 2.  Aortic Atherosclerosis (ICD10-I70.0). Electronically Signed   By: Iven Finn M.D.   On: 04/28/2022 23:23   DG Chest Port 1 View  Result Date: 04/28/2022 CLINICAL DATA:  Shortness of breath. EXAM: PORTABLE CHEST 1 VIEW COMPARISON:  04/25/2018 for FINDINGS: The  cardiac silhouette, mediastinal and contours are within normal limits given the AP projection and portable technique. Stable underlying emphysematous changes and pulmonary scarring. No acute overlying pulmonary process. No pleural effusion or pneumothorax. IMPRESSION: Emphysematous changes and pulmonary scarring but no acute overlying pulmonary process. Electronically Signed   By: Marijo Sanes M.D.   On: 04/28/2022 14:58    Pending Labs Unresulted Labs (From admission, onward)     Start     Ordered   05/06/22 0500  Creatinine, serum  (enoxaparin (LOVENOX)    CrCl >/=  30 ml/min)  Weekly,   R     Comments: while on enoxaparin therapy    04/29/22 1445   04/29/22 1446  TSH  Once,   R        04/29/22 1445   04/29/22 1445  CBC  (enoxaparin (LOVENOX)    CrCl >/= 30 ml/min)  Once,   R       Comments: Baseline for enoxaparin therapy IF NOT ALREADY DRAWN.  Notify MD if PLT < 100 K.    04/29/22 1445   04/29/22 1445  Creatinine, serum  (enoxaparin (LOVENOX)    CrCl >/= 30 ml/min)  Once,   R       Comments: Baseline for enoxaparin therapy IF NOT ALREADY DRAWN.    04/29/22 1445            Vitals/Pain Today's Vitals   04/29/22 1200 04/29/22 1230 04/29/22 1420 04/29/22 1430  BP: (!) 143/93 (!) 154/100 (!) 153/106   Pulse: 69 71 72 76  Resp: 18 18 18    Temp:    (!) 97.5 F (36.4 C)  TempSrc:    Oral  SpO2: 99% 99% 98% 100%  Weight:      Height:      PainSc:        Isolation Precautions No active isolations  Medications Medications  atorvastatin (LIPITOR) tablet 80 mg (has no administration in time range)  carvedilol (COREG) tablet 12.5 mg (has no administration in time range)  isosorbide mononitrate (IMDUR) 24 hr tablet 60 mg (has no administration in time range)  sacubitril-valsartan (ENTRESTO) 24-26 mg per tablet (has no administration in time range)  clopidogrel (PLAVIX) tablet 75 mg (has no administration in time range)  Budeson-Glycopyrrol-Formoterol 160-9-4.8 MCG/ACT AERO 2  puff (has no administration in time range)  enoxaparin (LOVENOX) injection 40 mg (has no administration in time range)  acetaminophen (TYLENOL) tablet 650 mg (has no administration in time range)    Or  acetaminophen (TYLENOL) suppository 650 mg (has no administration in time range)  docusate sodium (COLACE) capsule 100 mg (has no administration in time range)  polyethylene glycol (MIRALAX / GLYCOLAX) packet 17 g (has no administration in time range)  ondansetron (ZOFRAN) tablet 4 mg (has no administration in time range)    Or  ondansetron (ZOFRAN) injection 4 mg (has no administration in time range)  metoprolol tartrate (LOPRESSOR) injection 5 mg (has no administration in time range)  albuterol (PROVENTIL) (2.5 MG/3ML) 0.083% nebulizer solution 2.5 mg (has no administration in time range)    Mobility walks     Focused Assessments   R Recommendations: See Admitting Provider Note  Report given to:   Additional Notes:

## 2022-04-29 NOTE — Plan of Care (Signed)

## 2022-04-30 ENCOUNTER — Observation Stay (HOSPITAL_BASED_OUTPATIENT_CLINIC_OR_DEPARTMENT_OTHER): Payer: 59

## 2022-04-30 ENCOUNTER — Other Ambulatory Visit (HOSPITAL_COMMUNITY): Payer: Medicaid Other

## 2022-04-30 DIAGNOSIS — I5022 Chronic systolic (congestive) heart failure: Secondary | ICD-10-CM

## 2022-04-30 DIAGNOSIS — R55 Syncope and collapse: Secondary | ICD-10-CM | POA: Diagnosis not present

## 2022-04-30 LAB — ECHOCARDIOGRAM COMPLETE
AR max vel: 3.25 cm2
AV Area VTI: 2.94 cm2
AV Area mean vel: 2.88 cm2
AV Mean grad: 4 mmHg
AV Peak grad: 7.6 mmHg
Ao pk vel: 1.38 m/s
Area-P 1/2: 2.1 cm2
Height: 72 in
S' Lateral: 5.35 cm
Single Plane A4C EF: 22.1 %
Weight: 2328.06 oz

## 2022-04-30 MED ORDER — CARVEDILOL 6.25 MG PO TABS
6.2500 mg | ORAL_TABLET | Freq: Every day | ORAL | Status: DC
  Administered 2022-05-01 – 2022-05-03 (×3): 6.25 mg via ORAL
  Filled 2022-04-30 (×3): qty 1

## 2022-04-30 MED ORDER — PREDNISONE 20 MG PO TABS
40.0000 mg | ORAL_TABLET | Freq: Every day | ORAL | Status: DC
  Administered 2022-04-30 – 2022-05-03 (×4): 40 mg via ORAL
  Filled 2022-04-30 (×5): qty 2

## 2022-04-30 NOTE — Progress Notes (Signed)
Orthostatic vital signs completed. PT reports feeling light headed upon standing.   04/30/22 1121  Orthostatic Lying   BP- Lying 142/64  Pulse- Lying 59  Orthostatic Sitting  BP- Sitting 117/75  Pulse- Sitting 60  Orthostatic Standing at 0 minutes  BP- Standing at 0 minutes 104/55  Pulse- Standing at 0 minutes 63  Orthostatic Standing at 3 minutes  BP- Standing at 3 minutes 108/62  Pulse- Standing at 3 minutes 63

## 2022-04-30 NOTE — Progress Notes (Signed)
PROGRESS NOTE  Copen Sheeley [id number] DOB: September 04, 1955 DOA: 04/29/2022 PCP: [name], MD   LOS: 0 days   Brief Narrative / Interim history: 67 year old male with multiple medical problems including chronic systolic CHF, COPD, HTN, history of head and neck cancer status post partial resection with right partial rhinectomy and reconstruction, chronic kidney disease stage IIIb with baseline creatinine 1.9-2.0, comes into the hospital with complaints of a syncopal episode.  On 3/20 9 in the morning presented to [facility], ER due to wheezing, shortness of breath, felt better after inhaler and was discharged home with oral steroids.  After he left the hospital and early in the morning, he started to feel dizzy and lightheaded, and eventually he tells me he passed out but guided himself to the ground.  By passers called EMS and he was brought to the ER  Subjective / 24h Interval events: States that breathing is better but still little bit short, denies any chest pain or palpitations.  Has not been up yet  Assesement and Plan: Principal Problem:   Syncope and collapse Active Problems:   Essential hypertension   Anemia   Tobacco abuse   Heart murmur   Carotid artery disease (HCC)   COPD (chronic obstructive pulmonary disease) (HCC)   Urinary retention with incomplete bladder emptying   Chronic HFrEF (heart failure with reduced ejection fraction) (HCC)   Carotid stenosis, asymptomatic, right   CAD (coronary artery disease)   CKD (chronic kidney disease) stage 3, GFR 30-59 ml/min (HCC)   Angina at rest   Troponin level elevated   Elevated brain natriuretic peptide (BNP) level   Syncope  Principal problem Syncopal episode, orthostasis- orthostatic as he has not been eating and drinking well for the past few days due to breathing difficulties.  Allow oral intake -He is orthostatic this morning, blood pressure decreased from 142/64 on lying down to 108/62 standing, and with significant  symptomatic lightheadedness preventing him from even taking a few steps -Hold Entresto and see how his blood pressure does, decrease Coreg to 6.25  Active problems Chronic systolic CHF -currently out of all his medications.  Appears euvolemic, BNP significantly elevated at 2225.  He is orthostatic but I would avoid IV fluids, was rather hold Entresto to avoid worsening hypotension as above, continue Imdur, decrease Coreg to 6.25.  Most recent 2D echo September 2023 shows LVEF [id number], grade 3 diastolic dysfunction.  Updated 2D echo today with EF 35-40%  COPD exacerbation -while breathing better, still has some faint end expiratory wheezing.  Continue Pulmicort, albuterol as needed.  Continue prednisone with a quick taper  CAD-most recent cardiac cath done September 2023 showed mild to moderate CAD.  Currently no chest pain.  Continue Plavix, statin, Imdur  Hyperlipidemia-continue statin  Troponin elevation -flat, downtrending, no chest pain, with underlying CKD, this is likely demand  CKD stage IIIb-creatinine appears at baseline, 2.2 this morning  Anemia of chronic disease-hemoglobin 11.8, stable, no bleeding  Disposition -patient tells me that he was renting a room in a house, but prior to going to [facility] ED yesterday morning he was told never to go back and was kicked out.  He currently has no place to stay and does not know what to do.  He also tells me that when he went to the [facility] ER, he left without all his heart failure and COPD medications, he tried to go back but was not allowed back in, and thinks that someone in the ED stole his medications  or just simply clean the room and threw them away.  He has no means to get them refilled right now.  Due to advanced heart failure, homeless situation, TOC consulted.  He was never homeless before and does not know what to do  Scheduled Meds:  arformoterol  15 mcg Nebulization BID   atorvastatin  80 mg Oral QPM   budesonide (PULMICORT) nebulizer solution   0.25 mg Nebulization BID   [START ON 05/01/2022] carvedilol  6.25 mg Oral Daily   clopidogrel  75 mg Oral Daily   docusate sodium  100 mg Oral BID   enoxaparin (LOVENOX) injection  40 mg Subcutaneous Q24H   isosorbide mononitrate  60 mg Oral QPM   pneumococcal 20-valent conjugate vaccine  0.5 mL Intramuscular Tomorrow-1000   predniSONE  40 mg Oral Q breakfast   umeclidinium bromide  1 puff Inhalation Daily   Continuous Infusions: PRN Meds:.acetaminophen **OR** acetaminophen, albuterol, metoprolol tartrate, ondansetron **OR** ondansetron (ZOFRAN) IV, polyethylene glycol  Current Outpatient Medications  Medication Instructions   acetaminophen (TYLENOL) 1,300 mg, Oral, As needed   albuterol (VENTOLIN HFA) 108 (90 Base) MCG/ACT inhaler 2 puffs, Inhalation, Every 6 hours PRN   amLODipine (NORVASC) 5 mg, Oral, Daily   atorvastatin (LIPITOR) 80 mg, Oral, Every evening   Budeson-Glycopyrrol-Formoterol (BREZTRI AEROSPHERE) 160-9-4.8 MCG/ACT AERO 2 puffs, Inhalation, 2 times daily   carvedilol (COREG) 12.5 mg, Oral, 2 times daily with meals   clopidogrel (PLAVIX) 75 mg, Oral, Daily   ibuprofen (ADVIL) 800 mg, Oral, As needed   isosorbide mononitrate (IMDUR) 60 mg, Oral, Every evening   predniSONE (DELTASONE) 20 MG tablet 2 tabs po daily x 4 days   sacubitril-valsartan (ENTRESTO) 24-26 MG 1 tablet, Oral, 2 times daily    Diet Orders (From admission, onward)     Start     Ordered   04/29/22 1446  Diet Heart Room service appropriate? Yes; Fluid consistency: Thin  Diet effective now       Question Answer Comment  Room service appropriate? Yes   Fluid consistency: Thin      04/29/22 1445            DVT prophylaxis: enoxaparin (LOVENOX) injection 40 mg Start: 04/29/22 1800 SCDs Start: 04/29/22 1445   Lab Results  Component Value Date   PLT 236 04/29/2022      Code Status: Full Code  Family Communication: no family at bedside  Status is: Observation  The patient will require  care spanning > 2 midnights and should be moved to inpatient because: orthostatic, CHF medications adjustment, COPD exacerbation   Level of care: Telemetry  Consultants:  none  Objective: Vitals:   04/30/22 0011 04/30/22 0419 04/30/22 0755 04/30/22 0829  BP: (!) 112/54 137/65    Pulse: 73 (!) 55  64  Resp: 18 18    Temp: 98.3 F (36.8 C) 97.6 F (36.4 C)    TempSrc: Oral Oral    SpO2: 99% 100% 100%   Weight:      Height:        Intake/Output Summary (Last 24 hours) at 04/30/2022 1055 Last data filed at 04/30/2022 1051 Gross per 24 hour  Intake 1440 ml  Output 1350 ml  Net 90 ml   Wt Readings from Last 3 Encounters:  04/29/22 66 kg  04/28/22 65.8 kg  04/28/22 65.8 kg    Examination:  Constitutional: NAD Eyes: no scleral icterus ENMT: Mucous membranes are moist.  Neck: normal, supple Respiratory: Faint end expiratory wheezing, normal respiratory  effort Cardiovascular: Regular rate and rhythm, no murmurs / rubs / gallops. No LE edema.  Abdomen: non distended, no tenderness. Bowel sounds positive.  Musculoskeletal: no clubbing / cyanosis.  Skin: no rashes Neurologic: non focal   Data Reviewed: I have independently reviewed following labs and imaging studies   CBC Recent Labs  Lab 04/25/22 2156 04/27/22 0506 04/28/22 2315 04/29/22 1013 04/29/22 2147  WBC 10.5 7.1 7.5 8.7 5.8  HGB 12.7* 11.8* 12.3* 13.5 11.8*  HCT 39.2 37.1* 38.3* 43.1 37.2*  PLT 188 181 223 237 236  MCV 86.7 89.0 88.5 90.2 89.6  MCH 28.1 28.3 28.4 28.2 28.4  MCHC 32.4 31.8 32.1 31.3 31.7  RDW 15.3 15.4 15.1 15.1 15.2  LYMPHSABS  --   --  1.5 0.6*  --   MONOABS  --   --  0.7 0.1  --   EOSABS  --   --  0.2 0.0  --   BASOSABS  --   --  0.1 0.1  --     Recent Labs  Lab 04/25/22 2156 04/26/22 0152 04/27/22 0506 04/28/22 2252 04/29/22 0115 04/29/22 1013 04/29/22 2147  NA 136 136 138 139  --  138  --   K 2.7* 3.7 3.9 5.3*  --  4.0  --   CL 103 102 107 105  --  105  --   CO2 24  21* 24 22  --  21*  --   GLUCOSE 120* 99 100* 155*  --  118*  --   BUN 23 22 25* 24*  --  31*  --   CREATININE 2.03* 1.98* 1.87* 2.20*  --  2.02* 2.20*  CALCIUM 8.6* 8.9 8.2* 9.1  --  8.9  --   AST 35  --  30 39  --   --   --   ALT 28  --  28 29  --   --   --   ALKPHOS 168*  --  145* 159*  --   --   --   BILITOT 0.8  --  0.9 1.1  --   --   --   ALBUMIN 3.5  --  3.1* 3.4*  --   --   --   MG 1.8  --  2.3  --   --   --   --   TSH  --   --   --   --   --   --  0.524  BNP 2,310.0*  --   --   --  1,693.5* 2,225.0*  --     ------------------------------------------------------------------------------------------------------------------ No results for input(s): "CHOL", "HDL", "LDLCALC", "TRIG", "CHOLHDL", "LDLDIRECT" in the last 72 hours.  Lab Results  Component Value Date   HGBA1C 5.2 08/09/2021   ------------------------------------------------------------------------------------------------------------------ Recent Labs    04/29/22 2147  TSH 0.524    Cardiac Enzymes No results for input(s): "CKMB", "TROPONINI", "MYOGLOBIN" in the last 168 hours.  Invalid input(s): "CK" ------------------------------------------------------------------------------------------------------------------    Component Value Date/Time   BNP 2,225.0 (H) 04/29/2022 1013    CBG: No results for input(s): "GLUCAP" in the last 168 hours.  Recent Results (from the past 240 hour(s))  Resp panel by RT-PCR (RSV, Flu A&B, Covid) Anterior Nasal Swab     Status: None   Collection Time: 04/25/22  9:47 PM   Specimen: Anterior Nasal Swab  Result Value Ref Range Status   SARS Coronavirus 2 by RT PCR NEGATIVE NEGATIVE Final    Comment: (NOTE) SARS-CoV-2 target nucleic acids  are NOT DETECTED.  The SARS-CoV-2 RNA is generally detectable in upper respiratory specimens during the acute phase of infection. The lowest concentration of SARS-CoV-2 viral copies this assay can detect is 138 copies/mL. A negative result  does not preclude SARS-Cov-2 infection and should not be used as the sole basis for treatment or other patient management decisions. A negative result may occur with  improper specimen collection/handling, submission of specimen other than nasopharyngeal swab, presence of viral mutation(s) within the areas targeted by this assay, and inadequate number of viral copies(<138 copies/mL). A negative result must be combined with clinical observations, patient history, and epidemiological information. The expected result is Negative.  Fact Sheet for Patients:  EntrepreneurPulse.com.au  Fact Sheet for Healthcare Providers:  IncredibleEmployment.be  This test is no t yet approved or cleared by the [place] FDA and  has been authorized for detection and/or diagnosis of SARS-CoV-2 by FDA under an Emergency Use Authorization (EUA). This EUA will remain  in effect (meaning this test can be used) for the duration of the COVID-19 declaration under Section 564(b)(1) of the Act, 21 U.S.C.section 360bbb-3(b)(1), unless the authorization is terminated  or revoked sooner.       Influenza A by PCR NEGATIVE NEGATIVE Final   Influenza B by PCR NEGATIVE NEGATIVE Final    Comment: (NOTE) The Xpert Xpress SARS-CoV-2/FLU/RSV plus assay is intended as an aid in the diagnosis of influenza from Nasopharyngeal swab specimens and should not be used as a sole basis for treatment. Nasal washings and aspirates are unacceptable for Xpert Xpress SARS-CoV-2/FLU/RSV testing.  Fact Sheet for Patients: EntrepreneurPulse.com.au  Fact Sheet for Healthcare Providers: IncredibleEmployment.be  This test is not yet approved or cleared by the [place] FDA and has been authorized for detection and/or diagnosis of SARS-CoV-2 by FDA under an Emergency Use Authorization (EUA). This EUA will remain in effect (meaning this test can be used) for  the duration of the COVID-19 declaration under Section 564(b)(1) of the Act, 21 U.S.C. section 360bbb-3(b)(1), unless the authorization is terminated or revoked.     Resp Syncytial Virus by PCR NEGATIVE NEGATIVE Final    Comment: (NOTE) Fact Sheet for Patients: EntrepreneurPulse.com.au  Fact Sheet for Healthcare Providers: IncredibleEmployment.be  This test is not yet approved or cleared by the [place] FDA and has been authorized for detection and/or diagnosis of SARS-CoV-2 by FDA under an Emergency Use Authorization (EUA). This EUA will remain in effect (meaning this test can be used) for the duration of the COVID-19 declaration under Section 564(b)(1) of the Act, 21 U.S.C. section 360bbb-3(b)(1), unless the authorization is terminated or revoked.  Performed at [facility], [address], [place], [place] [address]      Radiology Studies: ECHOCARDIOGRAM COMPLETE  Result Date: 04/30/2022    ECHOCARDIOGRAM REPORT   [name] Date of Exam: 04/30/2022 Medical Rec #:  [id number]     Height:       72.0 in Accession #:    [id number]    Weight:       145.5 lb Date of Birth:  1955-08-12     BSA:          1.861 m Patient Age:    79 years      BP:           121/64 mmHg Patient Gender: M             HR:           64 bpm.  Exam Location:  Inpatient Procedure: 2D Echo, Cardiac Doppler and Color Doppler Indications:    CHF  History:        Patient has prior history of Echocardiogram examinations, most                 recent 10/21/2021. CAD, COPD, Signs/Symptoms:Syncope; Risk                 Factors:Hypertension and Current Smoker.  Sonographer:    Marella Chimes Referring Phys: [place]  1. Left ventricular ejection fraction, by estimation, is 35 to 40%. The left ventricle has moderately decreased function. The left ventricle demonstrates global hypokinesis. Left ventricular diastolic parameters are indeterminate.  2.  Right ventricular systolic function is normal. The right ventricular size is normal. There is normal pulmonary artery systolic pressure.  3. The mitral valve is normal in structure. No evidence of mitral valve regurgitation. No evidence of mitral stenosis.  4. The aortic valve has an indeterminant number of cusps. There is mild calcification of the aortic valve. There is mild thickening of the aortic valve. Aortic valve regurgitation is not visualized. No aortic stenosis is present.  5. The inferior vena cava is normal in size with greater than 50% respiratory variability, suggesting right atrial pressure of 3 mmHg. FINDINGS  Left Ventricle: Left ventricular ejection fraction, by estimation, is 35 to 40%. The left ventricle has moderately decreased function. The left ventricle demonstrates global hypokinesis. The left ventricular internal cavity size was normal in size. There is no left ventricular hypertrophy. Left ventricular diastolic parameters are indeterminate. Right Ventricle: The right ventricular size is normal. Right vetricular wall thickness was not well visualized. Right ventricular systolic function is normal. There is normal pulmonary artery systolic pressure. The tricuspid regurgitant velocity is 2.48 m/s, and with an assumed right atrial pressure of 3 mmHg, the estimated right ventricular systolic pressure is 0000000 mmHg. Left Atrium: Left atrial size was normal in size. Right Atrium: Right atrial size was normal in size. Pericardium: There is no evidence of pericardial effusion. Mitral Valve: The mitral valve is normal in structure. No evidence of mitral valve regurgitation. No evidence of mitral valve stenosis. Tricuspid Valve: The tricuspid valve is normal in structure. Tricuspid valve regurgitation is mild . No evidence of tricuspid stenosis. Aortic Valve: The aortic valve has an indeterminant number of cusps. There is mild calcification of the aortic valve. There is mild thickening of the aortic  valve. There is mild aortic valve annular calcification. Aortic valve regurgitation is not visualized. No aortic stenosis is present. Aortic valve mean gradient measures 4.0 mmHg. Aortic valve peak gradient measures 7.6 mmHg. Aortic valve area, by VTI measures 2.94 cm. Pulmonic Valve: The pulmonic valve was not well visualized. Pulmonic valve regurgitation is not visualized. No evidence of pulmonic stenosis. Aorta: The aortic root is normal in size and structure. Venous: The inferior vena cava is normal in size with greater than 50% respiratory variability, suggesting right atrial pressure of 3 mmHg. IAS/Shunts: No atrial level shunt detected by color flow Doppler.  LEFT VENTRICLE PLAX 2D LVIDd:         5.60 cm      Diastology LVIDs:         5.35 cm      LV e' medial:    3.48 cm/s LV PW:         1.10 cm      LV E/e' medial:  27.1 LV IVS:  1.05 cm      LV e' lateral:   9.25 cm/s LVOT diam:     2.20 cm      LV E/e' lateral: 10.2 LV SV:         93 LV SV Index:   50 LVOT Area:     3.80 cm  LV Volumes (MOD) LV vol d, MOD A4C: 149.0 ml LV vol s, MOD A4C: 116.0 ml LV SV MOD A4C:     149.0 ml RIGHT VENTRICLE RV S prime:     7.72 cm/s TAPSE (M-mode): 2.7 cm LEFT ATRIUM           Index        RIGHT ATRIUM           Index LA Vol (A2C): 66.3 ml 35.62 ml/m  RA Area:     20.20 cm LA Vol (A4C): 53.4 ml 28.69 ml/m  RA Volume:   55.50 ml  29.82 ml/m  AORTIC VALVE AV Area (Vmax):    3.25 cm AV Area (Vmean):   2.88 cm AV Area (VTI):     2.94 cm AV Vmax:           138.00 cm/s AV Vmean:          93.300 cm/s AV VTI:            0.317 m AV Peak Grad:      7.6 mmHg AV Mean Grad:      4.0 mmHg LVOT Vmax:         118.00 cm/s LVOT Vmean:        70.600 cm/s LVOT VTI:          0.245 m LVOT/AV VTI ratio: 0.77  AORTA Ao Root diam: 3.30 cm Ao Asc diam:  3.40 cm MITRAL VALVE               TRICUSPID VALVE MV Area (PHT): 2.10 cm    TR Peak grad:   24.6 mmHg MV Decel Time: 361 msec    TR Vmax:        248.00 cm/s MV E velocity: 94.30  cm/s MV A velocity: 94.30 cm/s  SHUNTS MV E/A ratio:  1.00        Systemic VTI:  0.24 m                            Systemic Diam: 2.20 cm Carlyle Dolly MD Electronically signed by Carlyle Dolly MD Signature Date/Time: 04/30/2022/10:09:53 AM    Final    CT Head Wo Contrast  Result Date: 04/29/2022 CLINICAL DATA:  Trauma EXAM: CT HEAD WITHOUT CONTRAST TECHNIQUE: Contiguous axial images were obtained from the base of the skull through the vertex without intravenous contrast. RADIATION DOSE REDUCTION: This exam was performed according to the departmental dose-optimization program which includes automated exposure control, adjustment of the mA and/or kV according to patient size and/or use of iterative reconstruction technique. COMPARISON:  CT Head 12/17/19 FINDINGS: Brain: No evidence of acute infarction, hemorrhage, hydrocephalus, extra-axial collection or mass lesion/mass effect. There is sequela of severe chronic microvascular ischemic change. Chronic infarcts in the left basal ganglia. Vascular: No hyperdense vessel or unexpected calcification. Skull: Postsurgical changes prior complex maxillofacial reconstruction. Sinuses/Orbits: Middle ear or mastoid. Paranasal sinuses are clear. There postsurgical changes from a prior orbital floor fracture repair. Orbits are otherwise unremarkable. Other: Likely an epidermal inclusion cyst along the left parietal scalp. IMPRESSION: 1. No acute intracranial abnormality. 2.  Sequela of severe chronic microvascular ischemic change. Electronically Signed   By: Marin Roberts M.D.   On: 04/29/2022 13:22   DG Chest 2 View  Result Date: 04/29/2022 CLINICAL DATA:  Shortness of breath. EXAM: CHEST - 2 VIEW COMPARISON:  April 28, 2022. FINDINGS: Stable cardiomediastinal silhouette. Hyperexpansion of the lungs is noted. No acute pulmonary disease is noted. Bony thorax is unremarkable. IMPRESSION: Hyperexpansion of the lungs. No acute cardiopulmonary abnormality seen. Aortic  Atherosclerosis (ICD10-I70.0). Electronically Signed   By: Marijo Conception M.D.   On: 04/29/2022 12:26     Marzetta Board, MD, PhD [facility]  Between 7 am - 7 pm I am available, please contact me via Amion (for emergencies) or Securechat (non urgent messages)  Between 7 pm - 7 am I am not available, please contact night coverage MD/APP via Amion

## 2022-04-30 NOTE — Plan of Care (Signed)
  Problem: Education: Goal: Knowledge of General Education information will improve Description: Including pain rating scale, medication(s)/side effects and non-pharmacologic comfort measures Outcome: Progressing   Problem: Clinical Measurements: Goal: Ability to maintain clinical measurements within normal limits will improve Outcome: Progressing   Problem: Activity: Goal: Risk for activity intolerance will decrease Outcome: Progressing   Problem: Nutrition: Goal: Adequate nutrition will be maintained Outcome: Progressing   Problem: Safety: Goal: Ability to remain free from injury will improve Outcome: Progressing   Problem: Health Behavior/Discharge Planning: Goal: Ability to manage health-related needs will improve Outcome: Not Progressing

## 2022-04-30 NOTE — Progress Notes (Signed)
  Echocardiogram 2D Echocardiogram has been performed.  Michael [name] 04/30/2022, 9:41 AM

## 2022-05-01 DIAGNOSIS — R55 Syncope and collapse: Secondary | ICD-10-CM | POA: Diagnosis not present

## 2022-05-01 LAB — COMPREHENSIVE METABOLIC PANEL
ALT: 24 U/L (ref 0–44)
AST: 20 U/L (ref 15–41)
Albumin: 3.3 g/dL — ABNORMAL LOW (ref 3.5–5.0)
Alkaline Phosphatase: 120 U/L (ref 38–126)
Anion gap: 7 (ref 5–15)
BUN: 42 mg/dL — ABNORMAL HIGH (ref 8–23)
CO2: 24 mmol/L (ref 22–32)
Calcium: 8.3 mg/dL — ABNORMAL LOW (ref 8.9–10.3)
Chloride: 109 mmol/L (ref 98–111)
Creatinine, Ser: 1.98 mg/dL — ABNORMAL HIGH (ref 0.61–1.24)
GFR, Estimated: 36 mL/min — ABNORMAL LOW (ref >60)
Glucose, Bld: 155 mg/dL — ABNORMAL HIGH (ref 70–99)
Potassium: 3.8 mmol/L (ref 3.5–5.1)
Sodium: 140 mmol/L (ref 135–145)
Total Bilirubin: 0.6 mg/dL (ref 0.3–1.2)
Total Protein: 6.5 g/dL (ref 6.5–8.1)

## 2022-05-01 LAB — CBC
HCT: 39 % (ref 39.0–52.0)
Hemoglobin: 12.3 g/dL — ABNORMAL LOW (ref 13.0–17.0)
MCH: 28.3 pg (ref 26.0–34.0)
MCHC: 31.5 g/dL (ref 30.0–36.0)
MCV: 89.7 fL (ref 80.0–100.0)
Platelets: 243 10*3/uL (ref 150–400)
RBC: 4.35 MIL/uL (ref 4.22–5.81)
RDW: 15 % (ref 11.5–15.5)
WBC: 8.1 10*3/uL (ref 4.0–10.5)
nRBC: 0 % (ref 0.0–0.2)

## 2022-05-01 LAB — MAGNESIUM: Magnesium: 2.2 mg/dL (ref 1.7–2.4)

## 2022-05-01 MED ORDER — PREDNISONE 10 MG PO TABS: 10.0000 mg | ORAL_TABLET | Freq: Every day | ORAL | 0 refills | Status: DC

## 2022-05-01 MED ORDER — CARVEDILOL 6.25 MG PO TABS: 6.2500 mg | ORAL_TABLET | Freq: Every day | ORAL | 1 refills | Status: DC

## 2022-05-01 MED ORDER — ENSURE ENLIVE PO LIQD
237.0000 mL | Freq: Two times a day (BID) | ORAL | Status: DC
  Administered 2022-05-02 – 2022-05-03 (×3): 237 mL via ORAL

## 2022-05-01 MED ORDER — PREDNISONE 10 MG PO TABS: ORAL_TABLET | ORAL | 0 refills | Status: DC

## 2022-05-01 NOTE — Care Management Important Message (Signed)
Important Message  Patient Details  Name: Michael [name] MRN: [id number] Date of Birth: November 14, 1955   Medicare Important Message Given:  Yes     Henrietta Dine, RN 05/01/2022, 6:16 PM

## 2022-05-01 NOTE — TOC Progression Note (Signed)
Transition of Care [facility]) - Progression Note    Patient Details  Name: Michael [name] MRN: [id number] Date of Birth: 09/06/1955  [facility] of [facility] CM/SW Contact  Henrietta Dine, RN Phone Number: 05/01/2022, 6:17 PM  Clinical Narrative:    TOC notified pt wants to appeal hospital d/c; Medicaid IM signed by pt; copy placed in shadow chart; pt verbalized understanding that he must initiate his claim by calling phone number listed on form; TOC will follow.   Expected Discharge Plan: Home/Self Care Barriers to Discharge: No Barriers Identified  Expected Discharge Plan and Services   Discharge Planning Services: CM Consult   Living arrangements for the past 2 months: Single Family Home Expected Discharge Date: 05/01/22                 DME Agency: NA         HH Agency: NA         Social Determinants of Health ([place]) Interventions [place]: No Food Insecurity (05/01/2022)  Housing: Medium Risk (05/01/2022)  Transportation Needs: Unmet Transportation Needs (05/01/2022)  Utilities: Not At Risk (05/01/2022)  Alcohol Screen: Low Risk  (12/11/2019)  Depression (PHQ2-9): Low Risk  (12/11/2019)  Financial Resource Strain: Low Risk  (12/11/2019)  Physical Activity: Inactive (12/11/2019)  Social Connections: Socially Isolated (12/11/2019)  Stress: No Stress Concern Present (12/11/2019)  Tobacco Use: Medium Risk (04/29/2022)    Readmission Risk Interventions    04/26/2022   11:46 AM 10/26/2021    2:57 PM 02/18/2021   11:33 AM  Readmission Risk Prevention Plan  Transportation Screening Complete Complete Complete  PCP or Specialist Appt within 5-7 Days   Complete  PCP or Specialist Appt within 3-5 Days  Complete   Home Care Screening   Complete  Medication Review (RN CM)   Complete  HRI or Home Care Consult Complete Complete   Social Work Consult for [place] Planning/Counseling Complete Complete   Palliative Care Screening Not  Applicable Not Applicable   Medication Review Press photographer) Complete Complete

## 2022-05-01 NOTE — Evaluation (Signed)
Physical Therapy Evaluation Patient Details Name: Michael [name] MRN: [id number] DOB: [birth year] Today's Date: 05/01/2022  History of Present Illness  67 year old male admitted  with syncopal episode. PMH :chronic systolic CHF, COPD, HTN, history of head and neck cancer status post partial resection with right partial rhinectomy and reconstruction, chronic kidney disease stage IIIb  Clinical Impression  Pt admitted with above diagnosis.  Pt agreeable but limited by dizziness/orthostasis. BP in sitting 167/88 (MAP 94), BP standing at 1 min 120/88 (MAP 94). Will continue to follow in acute setting. Defer d/c to [facility], however likely will not need f/u PT if medical issues resolve  Pt currently with functional limitations due to the deficits listed below (see PT Problem List). Pt will benefit from acute skilled PT to increase their independence and safety with mobility to allow discharge.          Recommendations for follow up therapy are one component of a multi-disciplinary discharge planning process, led by the attending physician.  Recommendations may be updated based on patient status, additional functional criteria and insurance authorization.  Follow Up Recommendations       Assistance Recommended at Discharge    Patient can return home with the following  Help with stairs or ramp for entrance    Equipment Recommendations None recommended by PT  Recommendations for Other Services       Functional Status Assessment Patient has had a recent decline in their functional status and/or demonstrates limited ability to make significant improvements in function in a reasonable and predictable amount of time     Precautions / Restrictions Precautions Precautions: Fall Precaution Comments: orthostatic Restrictions Weight Bearing Restrictions: No      Mobility  Bed Mobility Overal bed mobility: Modified Independent                  Transfers Overall transfer level: Needs  assistance   Transfers: Sit to/from Stand, Bed to chair/wheelchair/BSC Sit to Stand: Supervision   Step pivot transfers: Supervision       General transfer comment: for safety d/t dizziness, orthostatic; STS x2 from EOB    Ambulation/Gait     Assistive device: None            Stairs            Wheelchair Mobility    Modified Rankin (Stroke Patients Only)       Balance   Sitting-balance support: No upper extremity supported, Feet supported Sitting balance-Leahy Scale: Normal       Standing balance-Leahy Scale: Fair Standing balance comment: NT further d/t orthostatic                             Pertinent Vitals/Pain      Home Living Family/patient expects to be discharged to:: Shelter/Homeless Living Arrangements: Alone Available Help at Discharge: Friend(s);Available PRN/intermittently Type of Home: House Home Access: Ramped entrance           Additional Comments: pt is not forthcomign with prior livign situation or plan at d/c--states he person he was staying with phone has died and they can't get in touch    Prior Function Prior Level of Function : Independent/Modified Independent             Mobility Comments: Household and short distanced community ambulator without use of an AD, does not drive ADLs Comments: Independent     Hand Dominance        Extremity/Trunk Assessment  Communication   Communication: No difficulties  Cognition                                                General Comments      Exercises     Assessment/Plan    PT Assessment Patient needs continued PT services  PT Problem List Decreased strength;Decreased range of motion;Decreased activity tolerance;Decreased balance;Decreased mobility       PT Treatment Interventions DME instruction;Therapeutic exercise;Functional mobility training;Therapeutic activities;Patient/family education    PT Goals  (Current goals can be found in the Care Plan section)  Acute Rehab PT Goals Patient Stated Goal: to be able to walk without dizziness PT Goal Formulation: With patient Time For Goal Achievement: 05/15/22 Potential to Achieve Goals: Good    Frequency       Co-evaluation               AM-PAC PT "6 Clicks" Mobility  Outcome Measure Help needed turning from your back to your side while in a flat bed without using bedrails?: None Help needed moving from lying on your back to sitting on the side of a flat bed without using bedrails?: None Help needed moving to and from a bed to a chair (including a wheelchair)?: A Little Help needed standing up from a chair using your arms (e.g., wheelchair or bedside chair)?: A Little Help needed to walk in hospital room?: A Lot Help needed climbing 3-5 steps with a railing? : A Lot 6 Click Score: 18    End of Session Equipment Utilized During Treatment: Gait belt Activity Tolerance: Patient tolerated treatment well Patient left: in chair;with call bell/phone within reach;Other (comment) (alarm low battery, and off RN notified) Nurse Communication: Mobility status PT Visit Diagnosis: Unsteadiness on feet (R26.81);Other abnormalities of gait and mobility (R26.89);Muscle weakness (generalized) (M62.81)    Time: [id number] PT Time Calculation (min) (ACUTE ONLY): 16 min   Charges:   PT Evaluation $PT Eval Low Complexity: [place], PT  Acute Rehab Dept ([facility]) [phone]  05/01/2022   [facility] 05/01/2022, 4:06 PM

## 2022-05-01 NOTE — Plan of Care (Signed)
°  Problem: Clinical Measurements: °Goal: Will remain free from infection °Outcome: Progressing °  °Problem: Activity: °Goal: Risk for activity intolerance will decrease °Outcome: Progressing °  °Problem: Nutrition: °Goal: Adequate nutrition will be maintained °Outcome: Progressing °  °Problem: Elimination: °Goal: Will not experience complications related to bowel motility °Outcome: Progressing °Goal: Will not experience complications related to urinary retention °Outcome: Progressing °  °

## 2022-05-01 NOTE — Plan of Care (Signed)

## 2022-05-01 NOTE — Discharge Summary (Addendum)
Physician Discharge Summary  Carlos Upadhyaya [id number] DOB: 12-21-1955 DOA: 04/29/2022  PCP: [name], MD  Admit date: 04/29/2022 Discharge date: 05/01/2022  Admitted From: Home Disposition: Home  Recommendations for Outpatient Follow-up:  Follow up with PCP in 1-2 weeks  Discharge Condition: Stable CODE STATUS: Full Diet recommendation: Low-salt low-fat diet  Brief/Interim Summary: 67 year old male with multiple medical problems including chronic systolic CHF, COPD, HTN, history of head and neck cancer status post partial resection with right partial rhinectomy and reconstruction, chronic kidney disease stage IIIb with baseline creatinine 1.9-2.0, comes into the hospital with complaints of a syncopal episode. On 3/20 9 in the morning presented to [facility], ER due to wheezing, shortness of breath, felt better after inhaler and was discharged home with oral steroids. After he left the hospital and early in the morning, he started to feel dizzy and lightheaded, and eventually he tells me he passed out but guided himself to the ground. By passers called EMS and he was brought to the ER   Patient admitted as above with presumed syncopal event, patient had profound orthostatic positive vital signs in the ED consistent with his complaints.  After IV fluids and increase p.o. intake patient's blood pressure now remains within normal limits during orthostatic testing symptoms have resolved he is otherwise stable and agreeable for discharge home.  Close follow-up with PCP as scheduled, discussed discontinuing patient's Entresto and amlodipine as well as decreasing his dose of Coreg given his hypotension which appears to have improved over the past 24 hours.  At this time patient is otherwise stable and agreeable for discharge.  Case management has provided transport given patient's current living situation as he is now recently homeless -having lost access to the his previous rented  property.  Discharge Diagnoses:  Principal Problem:   Syncope and collapse Active Problems:   Essential hypertension   Anemia   Tobacco abuse   Heart murmur   Carotid artery disease (HCC)   COPD (chronic obstructive pulmonary disease) (HCC)   Urinary retention with incomplete bladder emptying   Chronic HFrEF (heart failure with reduced ejection fraction) (HCC)   Carotid stenosis, asymptomatic, right   CAD (coronary artery disease)   CKD (chronic kidney disease) stage 3, GFR 30-59 ml/min (HCC)   Angina at rest   Troponin level elevated   Elevated brain natriuretic peptide (BNP) level   Syncope    Discharge Instructions  Discharge Instructions     Discharge patient   Complete by: As directed    Discharge disposition: 01-Home or Self Care   Discharge patient date: 05/01/2022      Allergies as of 05/01/2022   No Known Allergies      Medication List     STOP taking these medications    amLODipine 5 MG tablet Commonly known as: NORVASC   Entresto 24-26 MG Generic drug: sacubitril-valsartan       TAKE these medications    acetaminophen 650 MG CR tablet Commonly known as: TYLENOL Take 1,300 mg by mouth as needed for pain.   albuterol 108 (90 Base) MCG/ACT inhaler Commonly known as: VENTOLIN HFA Inhale 2 puffs into the lungs every 6 (six) hours as needed for wheezing or shortness of breath.   atorvastatin 80 MG tablet Commonly known as: LIPITOR Take 1 tablet (80 mg total) by mouth every evening.   Breztri Aerosphere 160-9-4.8 MCG/ACT Aero Generic drug: Budeson-Glycopyrrol-Formoterol Inhale 2 puffs into the lungs 2 (two) times daily.   carvedilol 6.25 MG tablet Commonly  known as: COREG Take 1 tablet (6.25 mg total) by mouth daily. Start taking on: May 02, 2022 What changed:  medication strength how much to take when to take this   clopidogrel 75 MG tablet Commonly known as: PLAVIX Take 1 tablet (75 mg total) by mouth daily.   ibuprofen 200 MG  tablet Commonly known as: ADVIL Take 800 mg by mouth as needed for moderate pain.   isosorbide mononitrate 60 MG 24 hr tablet Commonly known as: IMDUR Take 1 tablet (60 mg total) by mouth every evening.   predniSONE 10 MG tablet Commonly known as: DELTASONE Take 4 tablets (40 mg total) by mouth daily for 3 days, THEN 3 tablets (30 mg total) daily for 3 days, THEN 2 tablets (20 mg total) daily for 3 days, THEN 1 tablet (10 mg total) daily for 3 days. Start taking on: May 01, 2022 What changed:  medication strength See the new instructions.        No Known Allergies  Consultations: None  Procedures/Studies: ECHOCARDIOGRAM COMPLETE  Result Date: 04/30/2022    ECHOCARDIOGRAM REPORT   Patient Name:   VENIAMIN WEIGMAN Date of Exam: 04/30/2022 Medical Rec #:  [id number]     Height:       72.0 in Accession #:    [id number]    Weight:       145.5 lb Date of Birth:  Jun 29, 1955     BSA:          1.861 m Patient Age:    67 years      BP:           121/64 mmHg Patient Gender: M             HR:           64 bpm. Exam Location:  Inpatient Procedure: 2D Echo, Cardiac Doppler and Color Doppler Indications:    CHF  History:        Patient has prior history of Echocardiogram examinations, most                 recent 10/21/2021. CAD, COPD, Signs/Symptoms:Syncope; Risk                 Factors:Hypertension and Current Smoker.  Sonographer:    Marella Chimes Referring Phys: Gould  1. Left ventricular ejection fraction, by estimation, is 35 to 40%. The left ventricle has moderately decreased function. The left ventricle demonstrates global hypokinesis. Left ventricular diastolic parameters are indeterminate.  2. Right ventricular systolic function is normal. The right ventricular size is normal. There is normal pulmonary artery systolic pressure.  3. The mitral valve is normal in structure. No evidence of mitral valve regurgitation. No evidence of mitral stenosis.  4. The aortic valve has  an indeterminant number of cusps. There is mild calcification of the aortic valve. There is mild thickening of the aortic valve. Aortic valve regurgitation is not visualized. No aortic stenosis is present.  5. The inferior vena cava is normal in size with greater than 50% respiratory variability, suggesting right atrial pressure of 3 mmHg. FINDINGS  Left Ventricle: Left ventricular ejection fraction, by estimation, is 35 to 40%. The left ventricle has moderately decreased function. The left ventricle demonstrates global hypokinesis. The left ventricular internal cavity size was normal in size. There is no left ventricular hypertrophy. Left ventricular diastolic parameters are indeterminate. Right Ventricle: The right ventricular size is normal. Right vetricular wall thickness was not well visualized. Right ventricular  systolic function is normal. There is normal pulmonary artery systolic pressure. The tricuspid regurgitant velocity is 2.48 m/s, and with an assumed right atrial pressure of 3 mmHg, the estimated right ventricular systolic pressure is 0000000 mmHg. Left Atrium: Left atrial size was normal in size. Right Atrium: Right atrial size was normal in size. Pericardium: There is no evidence of pericardial effusion. Mitral Valve: The mitral valve is normal in structure. No evidence of mitral valve regurgitation. No evidence of mitral valve stenosis. Tricuspid Valve: The tricuspid valve is normal in structure. Tricuspid valve regurgitation is mild . No evidence of tricuspid stenosis. Aortic Valve: The aortic valve has an indeterminant number of cusps. There is mild calcification of the aortic valve. There is mild thickening of the aortic valve. There is mild aortic valve annular calcification. Aortic valve regurgitation is not visualized. No aortic stenosis is present. Aortic valve mean gradient measures 4.0 mmHg. Aortic valve peak gradient measures 7.6 mmHg. Aortic valve area, by VTI measures 2.94 cm. Pulmonic  Valve: The pulmonic valve was not well visualized. Pulmonic valve regurgitation is not visualized. No evidence of pulmonic stenosis. Aorta: The aortic root is normal in size and structure. Venous: The inferior vena cava is normal in size with greater than 50% respiratory variability, suggesting right atrial pressure of 3 mmHg. IAS/Shunts: No atrial level shunt detected by color flow Doppler.  LEFT VENTRICLE PLAX 2D LVIDd:         5.60 cm      Diastology LVIDs:         5.35 cm      LV e' medial:    3.48 cm/s LV PW:         1.10 cm      LV E/e' medial:  27.1 LV IVS:        1.05 cm      LV e' lateral:   9.25 cm/s LVOT diam:     2.20 cm      LV E/e' lateral: 10.2 LV SV:         93 LV SV Index:   50 LVOT Area:     3.80 cm  LV Volumes (MOD) LV vol d, MOD A4C: 149.0 ml LV vol s, MOD A4C: 116.0 ml LV SV MOD A4C:     149.0 ml RIGHT VENTRICLE RV S prime:     7.72 cm/s TAPSE (M-mode): 2.7 cm LEFT ATRIUM           Index        RIGHT ATRIUM           Index LA Vol (A2C): 66.3 ml 35.62 ml/m  RA Area:     20.20 cm LA Vol (A4C): 53.4 ml 28.69 ml/m  RA Volume:   55.50 ml  29.82 ml/m  AORTIC VALVE AV Area (Vmax):    3.25 cm AV Area (Vmean):   2.88 cm AV Area (VTI):     2.94 cm AV Vmax:           138.00 cm/s AV Vmean:          93.300 cm/s AV VTI:            0.317 m AV Peak Grad:      7.6 mmHg AV Mean Grad:      4.0 mmHg LVOT Vmax:         118.00 cm/s LVOT Vmean:        70.600 cm/s LVOT VTI:          0.245  m LVOT/AV VTI ratio: 0.77  AORTA Ao Root diam: 3.30 cm Ao Asc diam:  3.40 cm MITRAL VALVE               TRICUSPID VALVE MV Area (PHT): 2.10 cm    TR Peak grad:   24.6 mmHg MV Decel Time: 361 msec    TR Vmax:        248.00 cm/s MV E velocity: 94.30 cm/s MV A velocity: 94.30 cm/s  SHUNTS MV E/A ratio:  1.00        Systemic VTI:  0.24 m                            Systemic Diam: 2.20 cm Carlyle Dolly MD Electronically signed by Carlyle Dolly MD Signature Date/Time: 04/30/2022/10:09:53 AM    Final    CT Head Wo  Contrast  Result Date: 04/29/2022 CLINICAL DATA:  Trauma EXAM: CT HEAD WITHOUT CONTRAST TECHNIQUE: Contiguous axial images were obtained from the base of the skull through the vertex without intravenous contrast. RADIATION DOSE REDUCTION: This exam was performed according to the departmental dose-optimization program which includes automated exposure control, adjustment of the mA and/or kV according to patient size and/or use of iterative reconstruction technique. COMPARISON:  CT Head 12/17/19 FINDINGS: Brain: No evidence of acute infarction, hemorrhage, hydrocephalus, extra-axial collection or mass lesion/mass effect. There is sequela of severe chronic microvascular ischemic change. Chronic infarcts in the left basal ganglia. Vascular: No hyperdense vessel or unexpected calcification. Skull: Postsurgical changes prior complex maxillofacial reconstruction. Sinuses/Orbits: Middle ear or mastoid. Paranasal sinuses are clear. There postsurgical changes from a prior orbital floor fracture repair. Orbits are otherwise unremarkable. Other: Likely an epidermal inclusion cyst along the left parietal scalp. IMPRESSION: 1. No acute intracranial abnormality. 2. Sequela of severe chronic microvascular ischemic change. Electronically Signed   By: Marin Roberts M.D.   On: 04/29/2022 13:22   DG Chest 2 View  Result Date: 04/29/2022 CLINICAL DATA:  Shortness of breath. EXAM: CHEST - 2 VIEW COMPARISON:  April 28, 2022. FINDINGS: Stable cardiomediastinal silhouette. Hyperexpansion of the lungs is noted. No acute pulmonary disease is noted. Bony thorax is unremarkable. IMPRESSION: Hyperexpansion of the lungs. No acute cardiopulmonary abnormality seen. Aortic Atherosclerosis (ICD10-I70.0). Electronically Signed   By: Marijo Conception M.D.   On: 04/29/2022 12:26   DG Chest 2 View  Result Date: 04/28/2022 CLINICAL DATA:  cough, SOB EXAM: CHEST - 2 VIEW COMPARISON:  Chest x-ray 04/28/2022, CT chest 04/25/2022 FINDINGS: The heart  and mediastinal contours are unchanged. Aortic calcification. Chronic right Base subcentimeter calcification likely granuloma. No focal consolidation. No pulmonary edema. No pleural effusion. No pneumothorax. No acute osseous abnormality. IMPRESSION: 1. No active cardiopulmonary disease. 2.  Aortic Atherosclerosis (ICD10-I70.0). Electronically Signed   By: Iven Finn M.D.   On: 04/28/2022 23:23   DG Chest Port 1 View  Result Date: 04/28/2022 CLINICAL DATA:  Shortness of breath. EXAM: PORTABLE CHEST 1 VIEW COMPARISON:  04/25/2018 for FINDINGS: The cardiac silhouette, mediastinal and contours are within normal limits given the AP projection and portable technique. Stable underlying emphysematous changes and pulmonary scarring. No acute overlying pulmonary process. No pleural effusion or pneumothorax. IMPRESSION: Emphysematous changes and pulmonary scarring but no acute overlying pulmonary process. Electronically Signed   By: Marijo Sanes M.D.   On: 04/28/2022 14:58   CT Angio Chest PE W and/or Wo Contrast  Result Date: 04/25/2022 CLINICAL DATA:  Shortness of breath EXAM:  CT ANGIOGRAPHY CHEST WITH CONTRAST TECHNIQUE: Multidetector CT imaging of the chest was performed using the standard protocol during bolus administration of intravenous contrast. Multiplanar CT image reconstructions and MIPs were obtained to evaluate the vascular anatomy. RADIATION DOSE REDUCTION: This exam was performed according to the departmental dose-optimization program which includes automated exposure control, adjustment of the mA and/or kV according to patient size and/or use of iterative reconstruction technique. CONTRAST:  4mL OMNIPAQUE IOHEXOL 350 MG/ML SOLN COMPARISON:  CT angiogram chest 11/13/2021.  CT abdomen 01/25/2022. FINDINGS: Cardiovascular: Heart is mildly enlarged. Aorta is normal in size. There is no pericardial effusion. There are atherosclerotic calcifications of the aorta. There is adequate opacification of  the pulmonary arteries to the segmental level. There is no evidence for pulmonary embolism. Mediastinum/Nodes: There is a heterogeneous left thyroid nodule measuring up to 2.5 cm similar to prior. There are no enlarged mediastinal or hilar lymph nodes. Esophagus is nondilated. Lungs/Pleura: Mild emphysematous changes are present. There stable nodular densities in the right lower lobe measuring up to 3 mm. There is a new rounded 4 mm nodule in the superior segment of the left lower lobe image 6/43. The lungs are otherwise clear. There is no pleural effusion or pneumothorax. Upper Abdomen: Stable hypodense lesions in the liver. Stable pancreatic calcifications. Musculoskeletal: No chest wall abnormality. No acute or significant osseous findings. Review of the MIP images confirms the above findings. IMPRESSION: 1. No evidence for pulmonary embolism. 2. Mild cardiomegaly. 3. New 4 mm left solid pulmonary nodule. Per Fleischner Society Guidelines, no routine follow-up imaging is recommended. These guidelines do not apply to immunocompromised patients and patients with cancer. Follow up in patients with significant comorbidities as clinically warranted. For lung cancer screening, adhere to Lung-RADS guidelines. Reference: Radiology. 2017; 284(1):228-43. 4. Stable right lower lobe pulmonary nodules measuring up to 3 mm. Aortic Atherosclerosis (ICD10-I70.0). Electronically Signed   By: Ronney Asters M.D.   On: 04/25/2022 23:35   DG Chest Port 1 View  Result Date: 04/25/2022 CLINICAL DATA:  Shortness of breath. EXAM: PORTABLE CHEST 1 VIEW COMPARISON:  04/13/2022. FINDINGS: The heart is enlarged and the mediastinal contour is within normal limits. There is atherosclerotic calcification of the aorta. Hyperinflation of the lungs is noted. There is chronic prominent interstitial thickening. No consolidation, effusion, or pneumothorax. No acute osseous abnormality. IMPRESSION: 1. Chronic interstitial prominence and  hyperinflation of the lungs with no acute abnormality. 2. Cardiomegaly. Electronically Signed   By: Brett Fairy M.D.   On: 04/25/2022 22:18   MR BRAIN WO CONTRAST  Result Date: 04/13/2022 CLINICAL DATA:  Stroke suspected EXAM: MRI HEAD WITHOUT CONTRAST TECHNIQUE: Multiplanar, multiecho pulse sequences of the brain and surrounding structures were obtained without intravenous contrast. COMPARISON:  MRI Head 06/04/17 FINDINGS: Brain: Negative for an acute infarct. Sequela of severe chronic microvascular ischemic change near confluence T2/FLAIR hyperintense periventricular signal abnormality. There are few small microhemorrhages in the bilateral thalami and cerebellar hemispheres, favored to be hypertensive in nature. No extra-axial fluid collection. Chronic infarct in the left basal ganglia. And bilateral corona radiata Vascular: Normal flow voids. Skull and upper cervical spine: There is a 1.5 cm cystic lesion in the subcutaneous soft tissues over the left occipital calvarium, favored to represent a small epidermal inclusion cyst. Postsurgical changes of the right orbital floor. Sinuses/Orbits: No middle ear or mastoid effusion. Mucosal thickening left maxillary sinus and bilateral ethmoid sinuses. Other: None IMPRESSION: 1. No acute intracranial process. 2. Sequela of severe chronic microvascular ischemic change. Electronically Signed  By: Marin Roberts M.D.   On: 04/13/2022 15:25   DG Chest 1 View  Result Date: 04/13/2022 CLINICAL DATA:  near-syncope EXAM: CHEST  1 VIEW COMPARISON:  Chest x-ray 02/24/2022. FINDINGS: Chronic mild prominence of the lung markings. No consolidation. No visible pleural effusions or pneumothorax. Cardiomediastinal silhouette is unchanged. Polyarticular degenerative change. Osteopenia. No acute displaced fracture identified. IMPRESSION: No evidence of acute cardiopulmonary disease. Electronically Signed   By: Margaretha Sheffield M.D.   On: 04/13/2022 13:41     Subjective: No  acute issues or events overnight   Discharge Exam: Vitals:   05/01/22 0946 05/01/22 1223  BP:  (!) 165/91  Pulse:  66  Resp:  17  Temp:  98.5 F (36.9 C)  SpO2: 98% 98%   Vitals:   04/30/22 2100 05/01/22 0419 05/01/22 0946 05/01/22 1223  BP: (!) 146/78 (!) 140/89  (!) 165/91  Pulse: 69 (!) 58  66  Resp: 18 20  17   Temp: 97.9 F (36.6 C) (!) 97.4 F (36.3 C)  98.5 F (36.9 C)  TempSrc: Oral Oral  Oral  SpO2: 98% 98% 98% 98%  Weight:      Height:        General: Pt is alert, awake, not in acute distress Cardiovascular: RRR, S1/S2 +, no rubs, no gallops Respiratory: CTA bilaterally, no wheezing, no rhonchi Abdominal: Soft, NT, ND, bowel sounds + Extremities: no edema, no cyanosis    The results of significant diagnostics from this hospitalization (including imaging, microbiology, ancillary and laboratory) are listed below for reference.     Microbiology: Recent Results (from the past 240 hour(s))  Resp panel by RT-PCR (RSV, Flu A&B, Covid) Anterior Nasal Swab     Status: None   Collection Time: 04/25/22  9:47 PM   Specimen: Anterior Nasal Swab  Result Value Ref Range Status   SARS Coronavirus 2 by RT PCR NEGATIVE NEGATIVE Final    Comment: (NOTE) SARS-CoV-2 target nucleic acids are NOT DETECTED.  The SARS-CoV-2 RNA is generally detectable in upper respiratory specimens during the acute phase of infection. The lowest concentration of SARS-CoV-2 viral copies this assay can detect is 138 copies/mL. A negative result does not preclude SARS-Cov-2 infection and should not be used as the sole basis for treatment or other patient management decisions. A negative result may occur with  improper specimen collection/handling, submission of specimen other than nasopharyngeal swab, presence of viral mutation(s) within the areas targeted by this assay, and inadequate number of viral copies(<138 copies/mL). A negative result must be combined with clinical observations,  patient history, and epidemiological information. The expected result is Negative.  Fact Sheet for Patients:  EntrepreneurPulse.com.au  Fact Sheet for Healthcare Providers:  IncredibleEmployment.be  This test is no t yet approved or cleared by the [place] FDA and  has been authorized for detection and/or diagnosis of SARS-CoV-2 by FDA under an Emergency Use Authorization (EUA). This EUA will remain  in effect (meaning this test can be used) for the duration of the COVID-19 declaration under Section 564(b)(1) of the Act, 21 U.S.C.section 360bbb-3(b)(1), unless the authorization is terminated  or revoked sooner.       Influenza A by PCR NEGATIVE NEGATIVE Final   Influenza B by PCR NEGATIVE NEGATIVE Final    Comment: (NOTE) The Xpert Xpress SARS-CoV-2/FLU/RSV plus assay is intended as an aid in the diagnosis of influenza from Nasopharyngeal swab specimens and should not be used as a sole basis for treatment. Nasal washings and aspirates are  unacceptable for Xpert Xpress SARS-CoV-2/FLU/RSV testing.  Fact Sheet for Patients: EntrepreneurPulse.com.au  Fact Sheet for Healthcare Providers: IncredibleEmployment.be  This test is not yet approved or cleared by the [place] FDA and has been authorized for detection and/or diagnosis of SARS-CoV-2 by FDA under an Emergency Use Authorization (EUA). This EUA will remain in effect (meaning this test can be used) for the duration of the COVID-19 declaration under Section 564(b)(1) of the Act, 21 U.S.C. section 360bbb-3(b)(1), unless the authorization is terminated or revoked.     Resp Syncytial Virus by PCR NEGATIVE NEGATIVE Final    Comment: (NOTE) Fact Sheet for Patients: EntrepreneurPulse.com.au  Fact Sheet for Healthcare Providers: IncredibleEmployment.be  This test is not yet approved or cleared by the [place] FDA and has been authorized for detection and/or diagnosis of SARS-CoV-2 by FDA under an Emergency Use Authorization (EUA). This EUA will remain in effect (meaning this test can be used) for the duration of the COVID-19 declaration under Section 564(b)(1) of the Act, 21 U.S.C. section 360bbb-3(b)(1), unless the authorization is terminated or revoked.  Performed at [facility], [address], [place], [place] [address]      Labs: BNP (last 3 results) Recent Labs    04/25/22 2156 04/29/22 0115 04/29/22 1013  BNP 2,310.0* 1,693.5* [id number]*   Basic Metabolic Panel: Recent Labs  Lab 04/25/22 2156 04/26/22 0152 04/27/22 0506 04/28/22 2252 04/29/22 1013 04/29/22 2147 05/01/22 0433  NA 136 136 138 139 138  --  140  K 2.7* 3.7 3.9 5.3* 4.0  --  3.8  CL 103 102 107 105 105  --  109  CO2 24 21* 24 22 21*  --  24  GLUCOSE 120* 99 100* 155* 118*  --  155*  BUN 23 22 25* 24* 31*  --  42*  CREATININE 2.03* 1.98* 1.87* 2.20* 2.02* 2.20* 1.98*  CALCIUM 8.6* 8.9 8.2* 9.1 8.9  --  8.3*  MG 1.8  --  2.3  --   --   --  2.2  PHOS  --   --  2.8  --   --   --   --    Liver Function Tests: Recent Labs  Lab 04/25/22 2156 04/27/22 0506 04/28/22 2252 05/01/22 0433  AST 35 30 39 20  ALT 28 28 29 24   ALKPHOS 168* 145* 159* 120  BILITOT 0.8 0.9 1.1 0.6  PROT 6.8 6.3* 6.8 6.5  ALBUMIN 3.5 3.1* 3.4* 3.3*   No results for input(s): "LIPASE", "AMYLASE" in the last 168 hours. No results for input(s): "AMMONIA" in the last 168 hours. CBC: Recent Labs  Lab 04/27/22 0506 04/28/22 2315 04/29/22 1013 04/29/22 2147 05/01/22 0433  WBC 7.1 7.5 8.7 5.8 8.1  NEUTROABS  --  5.0 7.8*  --   --   HGB 11.8* 12.3* 13.5 11.8* 12.3*  HCT 37.1* 38.3* 43.1 37.2* 39.0  MCV 89.0 88.5 90.2 89.6 89.7  PLT 181 223 237 236 243   Cardiac Enzymes: No results for input(s): "CKTOTAL", "CKMB", "CKMBINDEX", "TROPONINI" in the last 168 hours. BNP: Invalid input(s): "POCBNP" CBG: No results for  input(s): "GLUCAP" in the last 168 hours. D-Dimer No results for input(s): "DDIMER" in the last 72 hours. Hgb A1c No results for input(s): "HGBA1C" in the last 72 hours. Lipid Profile No results for input(s): "CHOL", "HDL", "LDLCALC", "TRIG", "CHOLHDL", "LDLDIRECT" in the last 72 hours. Thyroid function studies Recent Labs    04/29/22 2147  TSH 0.524   Anemia work up  No results for input(s): "VITAMINB12", "FOLATE", "FERRITIN", "TIBC", "IRON", "RETICCTPCT" in the last 72 hours. Urinalysis    Component Value Date/Time   COLORURINE YELLOW 04/13/2022 1504   APPEARANCEUR CLOUDY (A) 04/13/2022 1504   APPEARANCEUR Cloudy (A) 06/16/2021 0936   LABSPEC 1.011 04/13/2022 1504   LABSPEC 1.018 04/23/2011 1044   PHURINE 6.0 04/13/2022 1504   GLUCOSEU NEGATIVE 04/13/2022 1504   GLUCOSEU Negative 04/23/2011 1044   HGBUR SMALL (A) 04/13/2022 1504   BILIRUBINUR NEGATIVE 04/13/2022 1504   BILIRUBINUR Negative 06/16/2021 0936   BILIRUBINUR Negative 04/23/2011 1044   KETONESUR NEGATIVE 04/13/2022 1504   PROTEINUR 30 (A) 04/13/2022 1504   NITRITE NEGATIVE 04/13/2022 1504   LEUKOCYTESUR LARGE (A) 04/13/2022 1504   LEUKOCYTESUR Trace 04/23/2011 1044   Sepsis Labs Recent Labs  Lab 04/28/22 2315 04/29/22 1013 04/29/22 2147 05/01/22 0433  WBC 7.5 8.7 5.8 8.1   Microbiology Recent Results (from the past 240 hour(s))  Resp panel by RT-PCR (RSV, Flu A&B, Covid) Anterior Nasal Swab     Status: None   Collection Time: 04/25/22  9:47 PM   Specimen: Anterior Nasal Swab  Result Value Ref Range Status   SARS Coronavirus 2 by RT PCR NEGATIVE NEGATIVE Final    Comment: (NOTE) SARS-CoV-2 target nucleic acids are NOT DETECTED.  The SARS-CoV-2 RNA is generally detectable in upper respiratory specimens during the acute phase of infection. The lowest concentration of SARS-CoV-2 viral copies this assay can detect is 138 copies/mL. A negative result does not preclude SARS-Cov-2 infection and should not  be used as the sole basis for treatment or other patient management decisions. A negative result may occur with  improper specimen collection/handling, submission of specimen other than nasopharyngeal swab, presence of viral mutation(s) within the areas targeted by this assay, and inadequate number of viral copies(<138 copies/mL). A negative result must be combined with clinical observations, patient history, and epidemiological information. The expected result is Negative.  Fact Sheet for Patients:  EntrepreneurPulse.com.au  Fact Sheet for Healthcare Providers:  IncredibleEmployment.be  This test is no t yet approved or cleared by the [place] FDA and  has been authorized for detection and/or diagnosis of SARS-CoV-2 by FDA under an Emergency Use Authorization (EUA). This EUA will remain  in effect (meaning this test can be used) for the duration of the COVID-19 declaration under Section 564(b)(1) of the Act, 21 U.S.C.section 360bbb-3(b)(1), unless the authorization is terminated  or revoked sooner.       Influenza A by PCR NEGATIVE NEGATIVE Final   Influenza B by PCR NEGATIVE NEGATIVE Final    Comment: (NOTE) The Xpert Xpress SARS-CoV-2/FLU/RSV plus assay is intended as an aid in the diagnosis of influenza from Nasopharyngeal swab specimens and should not be used as a sole basis for treatment. Nasal washings and aspirates are unacceptable for Xpert Xpress SARS-CoV-2/FLU/RSV testing.  Fact Sheet for Patients: EntrepreneurPulse.com.au  Fact Sheet for Healthcare Providers: IncredibleEmployment.be  This test is not yet approved or cleared by the [place] FDA and has been authorized for detection and/or diagnosis of SARS-CoV-2 by FDA under an Emergency Use Authorization (EUA). This EUA will remain in effect (meaning this test can be used) for the duration of the COVID-19 declaration under Section  564(b)(1) of the Act, 21 U.S.C. section 360bbb-3(b)(1), unless the authorization is terminated or revoked.     Resp Syncytial Virus by PCR NEGATIVE NEGATIVE Final    Comment: (NOTE) Fact Sheet for Patients: EntrepreneurPulse.com.au  Fact Sheet for Healthcare Providers: IncredibleEmployment.be  This test is not yet approved or cleared by the [place] and has been authorized for detection and/or diagnosis of SARS-CoV-2 by FDA under an Emergency Use Authorization (EUA). This EUA will remain in effect (meaning this test can be used) for the duration of the COVID-19 declaration under Section 564(b)(1) of the Act, 21 U.S.C. section 360bbb-3(b)(1), unless the authorization is terminated or revoked.  Performed at [facility], [address], [place], [place] [address]      Time coordinating discharge: Over 30 minutes  SIGNED:   Little Ishikawa, DO [facility] 05/01/2022, 2:49 PM Pager   If 7PM-7AM, please contact night-coverage [email or web address]
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
## 2022-05-02 DIAGNOSIS — R55 Syncope and collapse: Secondary | ICD-10-CM | POA: Diagnosis not present

## 2022-05-02 NOTE — Care Management Important Message (Signed)
Important Message  Patient Details IM Letter given to the Patient. Name: Michael [name] MRN: [id number] Date of Birth: November 18, 1955   Medicare Important Message Given:  Yes     Kerin Salen 05/02/2022, 1:06 PM

## 2022-05-02 NOTE — TOC Progression Note (Signed)
Transition of Care [facility]) - Progression Note    Patient Details  Name: Michael [name] MRN: [id number] Date of Birth: Jun 21, 1955  Transition of Care [facility]) CM/SW Contact  [place] Mccuen, Juliann Pulse, RN Phone Number: 05/02/2022, 4:19 PM  Clinical Narrative: Still awaiting call from [facility] on appeal process.      Expected Discharge Plan: Home/Self Care Barriers to Discharge: Other (must enter comment) (Appealing discharge)  Expected Discharge Plan and Services   Discharge Planning Services: CM Consult   Living arrangements for the past 2 months: Single Family Home Expected Discharge Date: 05/01/22                 DME Agency: NA         HH Agency: NA         Social Determinants of Health ([place]) Interventions [place]: No Food Insecurity (05/01/2022)  Housing: Medium Risk (05/01/2022)  Transportation Needs: Unmet Transportation Needs (05/01/2022)  Utilities: Not At Risk (05/01/2022)  Alcohol Screen: Low Risk  (12/11/2019)  Depression (PHQ2-9): Low Risk  (12/11/2019)  Financial Resource Strain: Low Risk  (12/11/2019)  Physical Activity: Inactive (12/11/2019)  Social Connections: Socially Isolated (12/11/2019)  Stress: No Stress Concern Present (12/11/2019)  Tobacco Use: Medium Risk (04/29/2022)    Readmission Risk Interventions    04/26/2022   11:46 AM 10/26/2021    2:57 PM 02/18/2021   11:33 AM  Readmission Risk Prevention Plan  Transportation Screening Complete Complete Complete  PCP or Specialist Appt within 5-7 Days   Complete  PCP or Specialist Appt within 3-5 Days  Complete   Home Care Screening   Complete  Medication Review (RN CM)   Complete  HRI or Home Care Consult Complete Complete   Social Work Consult for [place] Planning/Counseling Complete Complete   Palliative Care Screening Not Applicable Not Applicable   Medication Review Press photographer) Complete Complete

## 2022-05-02 NOTE — TOC Transition Note (Signed)
Transition of Care [facility]) - CM/SW Discharge Note   Patient Details  Name: Michael [name] MRN: [id number] Date of Birth: [birth year]  Transition of Care [facility]) CM/SW Contact:  Phyllis Ginger, RN Phone Number: 05/02/2022, 12:53 PM   Clinical Narrative:  Pt appealing discharge, await appeals process.     Final next level of care: Home/Self Care Barriers to Discharge: Other (must enter comment) (Appealing discharge)   Patient Goals and CMS Choice      Discharge Placement                         Discharge Plan and Services Additional resources added to the After Visit Summary for     Discharge Planning Services: CM Consult              DME Agency: NA         HH Agency: NA        Social Determinants of Health (SDOH) Interventions SDOH Screenings   Food Insecurity: No Food Insecurity (05/01/2022)  Housing: Medium Risk (05/01/2022)  Transportation Needs: Unmet Transportation Needs (05/01/2022)  Utilities: Not At Risk (05/01/2022)  Alcohol Screen: Low Risk  (12/11/2019)  Depression (PHQ2-9): Low Risk  (12/11/2019)  Financial Resource Strain: Low Risk  (12/11/2019)  Physical Activity: Inactive (12/11/2019)  Social Connections: Socially Isolated (12/11/2019)  Stress: No Stress Concern Present (12/11/2019)  Tobacco Use: Medium Risk (04/29/2022)     Readmission Risk Interventions    04/26/2022   11:46 AM 10/26/2021    2:57 PM 02/18/2021   11:33 AM  Readmission Risk Prevention Plan  Transportation Screening Complete Complete Complete  PCP or Specialist Appt within 5-7 Days   Complete  PCP or Specialist Appt within 3-5 Days  Complete   Home Care Screening   Complete  Medication Review (RN CM)   Complete  HRI or Home Care Consult Complete Complete   Social Work Consult for [place] Planning/Counseling Complete Complete   Palliative Care Screening Not Applicable Not Applicable   Medication Review Press photographer) Complete Complete

## 2022-05-02 NOTE — Discharge Summary (Signed)
Physician Discharge Summary  Dietrich Maran [id number] DOB: 25-Feb-1955 DOA: 04/29/2022  PCP: [name], MD  Admit date: 04/29/2022 Discharge date: 05/02/2022  Admitted From: Home Disposition: Home  Recommendations for Outpatient Follow-up:  Follow up with PCP in 1-2 weeks  Discharge Condition: Stable CODE STATUS: Full Diet recommendation: Low-salt low-fat diet  Brief/Interim Summary: 67 year old male with multiple medical problems including chronic systolic CHF, COPD, HTN, history of head and neck cancer status post partial resection with right partial rhinectomy and reconstruction, chronic kidney disease stage IIIb with baseline creatinine 1.9-2.0, comes into the hospital with complaints of a syncopal episode. On 3/20 9 in the morning presented to [facility], ER due to wheezing, shortness of breath, felt better after inhaler and was discharged home with oral steroids. After he left the hospital and early in the morning, he started to feel dizzy and lightheaded, and eventually he tells me he passed out but guided himself to the ground. By passers called EMS and he was brought to the ER   Patient admitted as above with presumed syncopal event, patient had profound orthostatic positive vital signs in the ED consistent with his complaints.  After IV fluids and increase p.o. intake patient's blood pressure now remains within normal limits during orthostatic testing symptoms have resolved he is otherwise stable and agreeable for discharge home.  Close follow-up with PCP as scheduled, discussed discontinuing patient's Entresto and amlodipine as well as decreasing his dose of Coreg given his hypotension which appears to have improved over the past 24 hours.  At this time patient is otherwise stable and agreeable for discharge.  Case management has provided transport given patient's current living situation as he is now recently homeless -having lost access to the his previous rented  property.  **Patient appealed discharge - awaiting insurance evaluation. Remains medically stable for discharge, disposition complicated given newly homeless.  Discharge Diagnoses:  Principal Problem:   Syncope and collapse Active Problems:   Essential hypertension   Anemia   Tobacco abuse   Heart murmur   Carotid artery disease   COPD (chronic obstructive pulmonary disease)   Urinary retention with incomplete bladder emptying   Chronic HFrEF (heart failure with reduced ejection fraction)   Carotid stenosis, asymptomatic, right   CAD (coronary artery disease)   CKD (chronic kidney disease) stage 3, GFR 30-59 ml/min   Angina at rest   Troponin level elevated   Elevated brain natriuretic peptide (BNP) level   Syncope    Discharge Instructions  Discharge Instructions     Discharge patient   Complete by: As directed    Discharge disposition: 01-Home or Self Care   Discharge patient date: 05/01/2022      Allergies as of 05/02/2022   No Known Allergies      Medication List     STOP taking these medications    amLODipine 5 MG tablet Commonly known as: NORVASC   Entresto 24-26 MG Generic drug: sacubitril-valsartan       TAKE these medications    acetaminophen 650 MG CR tablet Commonly known as: TYLENOL Take 1,300 mg by mouth as needed for pain.   albuterol 108 (90 Base) MCG/ACT inhaler Commonly known as: VENTOLIN HFA Inhale 2 puffs into the lungs every 6 (six) hours as needed for wheezing or shortness of breath.   atorvastatin 80 MG tablet Commonly known as: LIPITOR Take 1 tablet (80 mg total) by mouth every evening.   Breztri Aerosphere 160-9-4.8 MCG/ACT Aero Generic drug: Budeson-Glycopyrrol-Formoterol Inhale 2 puffs  into the lungs 2 (two) times daily.   carvedilol 6.25 MG tablet Commonly known as: COREG Take 1 tablet (6.25 mg total) by mouth daily. What changed:  medication strength how much to take when to take this   clopidogrel 75 MG  tablet Commonly known as: PLAVIX Take 1 tablet (75 mg total) by mouth daily.   ibuprofen 200 MG tablet Commonly known as: ADVIL Take 800 mg by mouth as needed for moderate pain.   isosorbide mononitrate 60 MG 24 hr tablet Commonly known as: IMDUR Take 1 tablet (60 mg total) by mouth every evening.   predniSONE 10 MG tablet Commonly known as: DELTASONE Take 4 tablets (40 mg total) by mouth daily for 3 days, THEN 3 tablets (30 mg total) daily for 3 days, THEN 2 tablets (20 mg total) daily for 3 days, THEN 1 tablet (10 mg total) daily for 3 days. Start taking on: May 01, 2022 What changed:  medication strength See the new instructions.        No Known Allergies  Consultations: None  Procedures/Studies: ECHOCARDIOGRAM COMPLETE  Result Date: 04/30/2022    ECHOCARDIOGRAM REPORT   Patient Name:   MICAHI CARUFEL Date of Exam: 04/30/2022 Medical Rec #:  [id number]     Height:       72.0 in Accession #:    [id number]    Weight:       145.5 lb Date of Birth:  [birth year]     BSA:          1.861 m Patient Age:    51 years      BP:           121/64 mmHg Patient Gender: M             HR:           64 bpm. Exam Location:  Inpatient Procedure: 2D Echo, Cardiac Doppler and Color Doppler Indications:    CHF  History:        Patient has prior history of Echocardiogram examinations, most                 recent 10/21/2021. CAD, COPD, Signs/Symptoms:Syncope; Risk                 Factors:Hypertension and Current Smoker.  Sonographer:    Marella Chimes Referring Phys: [place]  1. Left ventricular ejection fraction, by estimation, is 35 to 40%. The left ventricle has moderately decreased function. The left ventricle demonstrates global hypokinesis. Left ventricular diastolic parameters are indeterminate.  2. Right ventricular systolic function is normal. The right ventricular size is normal. There is normal pulmonary artery systolic pressure.  3. The mitral valve is normal in structure.  No evidence of mitral valve regurgitation. No evidence of mitral stenosis.  4. The aortic valve has an indeterminant number of cusps. There is mild calcification of the aortic valve. There is mild thickening of the aortic valve. Aortic valve regurgitation is not visualized. No aortic stenosis is present.  5. The inferior vena cava is normal in size with greater than 50% respiratory variability, suggesting right atrial pressure of 3 mmHg. FINDINGS  Left Ventricle: Left ventricular ejection fraction, by estimation, is 35 to 40%. The left ventricle has moderately decreased function. The left ventricle demonstrates global hypokinesis. The left ventricular internal cavity size was normal in size. There is no left ventricular hypertrophy. Left ventricular diastolic parameters are indeterminate. Right Ventricle: The right ventricular size is normal. Right vetricular  wall thickness was not well visualized. Right ventricular systolic function is normal. There is normal pulmonary artery systolic pressure. The tricuspid regurgitant velocity is 2.48 m/s, and with an assumed right atrial pressure of 3 mmHg, the estimated right ventricular systolic pressure is 0000000 mmHg. Left Atrium: Left atrial size was normal in size. Right Atrium: Right atrial size was normal in size. Pericardium: There is no evidence of pericardial effusion. Mitral Valve: The mitral valve is normal in structure. No evidence of mitral valve regurgitation. No evidence of mitral valve stenosis. Tricuspid Valve: The tricuspid valve is normal in structure. Tricuspid valve regurgitation is mild . No evidence of tricuspid stenosis. Aortic Valve: The aortic valve has an indeterminant number of cusps. There is mild calcification of the aortic valve. There is mild thickening of the aortic valve. There is mild aortic valve annular calcification. Aortic valve regurgitation is not visualized. No aortic stenosis is present. Aortic valve mean gradient measures 4.0 mmHg.  Aortic valve peak gradient measures 7.6 mmHg. Aortic valve area, by VTI measures 2.94 cm. Pulmonic Valve: The pulmonic valve was not well visualized. Pulmonic valve regurgitation is not visualized. No evidence of pulmonic stenosis. Aorta: The aortic root is normal in size and structure. Venous: The inferior vena cava is normal in size with greater than 50% respiratory variability, suggesting right atrial pressure of 3 mmHg. IAS/Shunts: No atrial level shunt detected by color flow Doppler.  LEFT VENTRICLE PLAX 2D LVIDd:         5.60 cm      Diastology LVIDs:         5.35 cm      LV e' medial:    3.48 cm/s LV PW:         1.10 cm      LV E/e' medial:  27.1 LV IVS:        1.05 cm      LV e' lateral:   9.25 cm/s LVOT diam:     2.20 cm      LV E/e' lateral: 10.2 LV SV:         93 LV SV Index:   50 LVOT Area:     3.80 cm  LV Volumes (MOD) LV vol d, MOD A4C: 149.0 ml LV vol s, MOD A4C: 116.0 ml LV SV MOD A4C:     149.0 ml RIGHT VENTRICLE RV S prime:     7.72 cm/s TAPSE (M-mode): 2.7 cm LEFT ATRIUM           Index        RIGHT ATRIUM           Index LA Vol (A2C): 66.3 ml 35.62 ml/m  RA Area:     20.20 cm LA Vol (A4C): 53.4 ml 28.69 ml/m  RA Volume:   55.50 ml  29.82 ml/m  AORTIC VALVE AV Area (Vmax):    3.25 cm AV Area (Vmean):   2.88 cm AV Area (VTI):     2.94 cm AV Vmax:           138.00 cm/s AV Vmean:          93.300 cm/s AV VTI:            0.317 m AV Peak Grad:      7.6 mmHg AV Mean Grad:      4.0 mmHg LVOT Vmax:         118.00 cm/s LVOT Vmean:        70.600 cm/s LVOT VTI:  0.245 m LVOT/AV VTI ratio: 0.77  AORTA Ao Root diam: 3.30 cm Ao Asc diam:  3.40 cm MITRAL VALVE               TRICUSPID VALVE MV Area (PHT): 2.10 cm    TR Peak grad:   24.6 mmHg MV Decel Time: 361 msec    TR Vmax:        248.00 cm/s MV E velocity: 94.30 cm/s MV A velocity: 94.30 cm/s  SHUNTS MV E/A ratio:  1.00        Systemic VTI:  0.24 m                            Systemic Diam: 2.20 cm Carlyle Dolly MD Electronically signed by  Carlyle Dolly MD Signature Date/Time: 04/30/2022/10:09:53 AM    Final    CT Head Wo Contrast  Result Date: 04/29/2022 CLINICAL DATA:  Trauma EXAM: CT HEAD WITHOUT CONTRAST TECHNIQUE: Contiguous axial images were obtained from the base of the skull through the vertex without intravenous contrast. RADIATION DOSE REDUCTION: This exam was performed according to the departmental dose-optimization program which includes automated exposure control, adjustment of the mA and/or kV according to patient size and/or use of iterative reconstruction technique. COMPARISON:  CT Head 12/17/19 FINDINGS: Brain: No evidence of acute infarction, hemorrhage, hydrocephalus, extra-axial collection or mass lesion/mass effect. There is sequela of severe chronic microvascular ischemic change. Chronic infarcts in the left basal ganglia. Vascular: No hyperdense vessel or unexpected calcification. Skull: Postsurgical changes prior complex maxillofacial reconstruction. Sinuses/Orbits: Middle ear or mastoid. Paranasal sinuses are clear. There postsurgical changes from a prior orbital floor fracture repair. Orbits are otherwise unremarkable. Other: Likely an epidermal inclusion cyst along the left parietal scalp. IMPRESSION: 1. No acute intracranial abnormality. 2. Sequela of severe chronic microvascular ischemic change. Electronically Signed   By: Marin Roberts M.D.   On: 04/29/2022 13:22   DG Chest 2 View  Result Date: 04/29/2022 CLINICAL DATA:  Shortness of breath. EXAM: CHEST - 2 VIEW COMPARISON:  April 28, 2022. FINDINGS: Stable cardiomediastinal silhouette. Hyperexpansion of the lungs is noted. No acute pulmonary disease is noted. Bony thorax is unremarkable. IMPRESSION: Hyperexpansion of the lungs. No acute cardiopulmonary abnormality seen. Aortic Atherosclerosis (ICD10-I70.0). Electronically Signed   By: Marijo Conception M.D.   On: 04/29/2022 12:26   DG Chest 2 View  Result Date: 04/28/2022 CLINICAL DATA:  cough, SOB EXAM: CHEST  - 2 VIEW COMPARISON:  Chest x-ray 04/28/2022, CT chest 04/25/2022 FINDINGS: The heart and mediastinal contours are unchanged. Aortic calcification. Chronic right Base subcentimeter calcification likely granuloma. No focal consolidation. No pulmonary edema. No pleural effusion. No pneumothorax. No acute osseous abnormality. IMPRESSION: 1. No active cardiopulmonary disease. 2.  Aortic Atherosclerosis (ICD10-I70.0). Electronically Signed   By: Iven Finn M.D.   On: 04/28/2022 23:23   DG Chest Port 1 View  Result Date: 04/28/2022 CLINICAL DATA:  Shortness of breath. EXAM: PORTABLE CHEST 1 VIEW COMPARISON:  04/25/2018 for FINDINGS: The cardiac silhouette, mediastinal and contours are within normal limits given the AP projection and portable technique. Stable underlying emphysematous changes and pulmonary scarring. No acute overlying pulmonary process. No pleural effusion or pneumothorax. IMPRESSION: Emphysematous changes and pulmonary scarring but no acute overlying pulmonary process. Electronically Signed   By: Marijo Sanes M.D.   On: 04/28/2022 14:58   CT Angio Chest PE W and/or Wo Contrast  Result Date: 04/25/2022 CLINICAL DATA:  Shortness of breath  EXAM: CT ANGIOGRAPHY CHEST WITH CONTRAST TECHNIQUE: Multidetector CT imaging of the chest was performed using the standard protocol during bolus administration of intravenous contrast. Multiplanar CT image reconstructions and MIPs were obtained to evaluate the vascular anatomy. RADIATION DOSE REDUCTION: This exam was performed according to the departmental dose-optimization program which includes automated exposure control, adjustment of the mA and/or kV according to patient size and/or use of iterative reconstruction technique. CONTRAST:  39mL OMNIPAQUE IOHEXOL 350 MG/ML SOLN COMPARISON:  CT angiogram chest 11/13/2021.  CT abdomen 01/25/2022. FINDINGS: Cardiovascular: Heart is mildly enlarged. Aorta is normal in size. There is no pericardial effusion. There  are atherosclerotic calcifications of the aorta. There is adequate opacification of the pulmonary arteries to the segmental level. There is no evidence for pulmonary embolism. Mediastinum/Nodes: There is a heterogeneous left thyroid nodule measuring up to 2.5 cm similar to prior. There are no enlarged mediastinal or hilar lymph nodes. Esophagus is nondilated. Lungs/Pleura: Mild emphysematous changes are present. There stable nodular densities in the right lower lobe measuring up to 3 mm. There is a new rounded 4 mm nodule in the superior segment of the left lower lobe image 6/43. The lungs are otherwise clear. There is no pleural effusion or pneumothorax. Upper Abdomen: Stable hypodense lesions in the liver. Stable pancreatic calcifications. Musculoskeletal: No chest wall abnormality. No acute or significant osseous findings. Review of the MIP images confirms the above findings. IMPRESSION: 1. No evidence for pulmonary embolism. 2. Mild cardiomegaly. 3. New 4 mm left solid pulmonary nodule. Per Fleischner Society Guidelines, no routine follow-up imaging is recommended. These guidelines do not apply to immunocompromised patients and patients with cancer. Follow up in patients with significant comorbidities as clinically warranted. For lung cancer screening, adhere to Lung-RADS guidelines. Reference: Radiology. 2017; 284(1):228-43. 4. Stable right lower lobe pulmonary nodules measuring up to 3 mm. Aortic Atherosclerosis (ICD10-I70.0). Electronically Signed   By: Ronney Asters M.D.   On: 04/25/2022 23:35   DG Chest Port 1 View  Result Date: 04/25/2022 CLINICAL DATA:  Shortness of breath. EXAM: PORTABLE CHEST 1 VIEW COMPARISON:  04/13/2022. FINDINGS: The heart is enlarged and the mediastinal contour is within normal limits. There is atherosclerotic calcification of the aorta. Hyperinflation of the lungs is noted. There is chronic prominent interstitial thickening. No consolidation, effusion, or pneumothorax. No  acute osseous abnormality. IMPRESSION: 1. Chronic interstitial prominence and hyperinflation of the lungs with no acute abnormality. 2. Cardiomegaly. Electronically Signed   By: Brett Fairy M.D.   On: 04/25/2022 22:18   MR BRAIN WO CONTRAST  Result Date: 04/13/2022 CLINICAL DATA:  Stroke suspected EXAM: MRI HEAD WITHOUT CONTRAST TECHNIQUE: Multiplanar, multiecho pulse sequences of the brain and surrounding structures were obtained without intravenous contrast. COMPARISON:  MRI Head 06/04/17 FINDINGS: Brain: Negative for an acute infarct. Sequela of severe chronic microvascular ischemic change near confluence T2/FLAIR hyperintense periventricular signal abnormality. There are few small microhemorrhages in the bilateral thalami and cerebellar hemispheres, favored to be hypertensive in nature. No extra-axial fluid collection. Chronic infarct in the left basal ganglia. And bilateral corona radiata Vascular: Normal flow voids. Skull and upper cervical spine: There is a 1.5 cm cystic lesion in the subcutaneous soft tissues over the left occipital calvarium, favored to represent a small epidermal inclusion cyst. Postsurgical changes of the right orbital floor. Sinuses/Orbits: No middle ear or mastoid effusion. Mucosal thickening left maxillary sinus and bilateral ethmoid sinuses. Other: None IMPRESSION: 1. No acute intracranial process. 2. Sequela of severe chronic microvascular ischemic change. Electronically  Signed   By: Marin Roberts M.D.   On: 04/13/2022 15:25   DG Chest 1 View  Result Date: 04/13/2022 CLINICAL DATA:  near-syncope EXAM: CHEST  1 VIEW COMPARISON:  Chest x-ray 02/24/2022. FINDINGS: Chronic mild prominence of the lung markings. No consolidation. No visible pleural effusions or pneumothorax. Cardiomediastinal silhouette is unchanged. Polyarticular degenerative change. Osteopenia. No acute displaced fracture identified. IMPRESSION: No evidence of acute cardiopulmonary disease. Electronically Signed    By: Margaretha Sheffield M.D.   On: 04/13/2022 13:41     Subjective: No acute issues or events overnight   Discharge Exam: Vitals:   05/02/22 0110 05/02/22 0501  BP: 134/73 (!) 157/74  Pulse: 65 62  Resp: 20 18  Temp: (!) 97.5 F (36.4 C) 98.2 F (36.8 C)  SpO2: 98% 100%   Vitals:   05/01/22 2131 05/02/22 0110 05/02/22 0110 05/02/22 0501  BP: (!) 165/92 134/73 134/73 (!) 157/74  Pulse:  65 65 62  Resp:  20 20 18   Temp:  (!) 97.5 F (36.4 C) (!) 97.5 F (36.4 C) 98.2 F (36.8 C)  TempSrc:  Oral Oral Oral  SpO2:  99% 98% 100%  Weight:      Height:        General: Pt is alert, awake, not in acute distress Cardiovascular: RRR, S1/S2 +, no rubs, no gallops Respiratory: CTA bilaterally, no wheezing, no rhonchi Abdominal: Soft, NT, ND, bowel sounds + Extremities: no edema, no cyanosis    The results of significant diagnostics from this hospitalization (including imaging, microbiology, ancillary and laboratory) are listed below for reference.     Microbiology: Recent Results (from the past 240 hour(s))  Resp panel by RT-PCR (RSV, Flu A&B, Covid) Anterior Nasal Swab     Status: None   Collection Time: 04/25/22  9:47 PM   Specimen: Anterior Nasal Swab  Result Value Ref Range Status   SARS Coronavirus 2 by RT PCR NEGATIVE NEGATIVE Final    Comment: (NOTE) SARS-CoV-2 target nucleic acids are NOT DETECTED.  The SARS-CoV-2 RNA is generally detectable in upper respiratory specimens during the acute phase of infection. The lowest concentration of SARS-CoV-2 viral copies this assay can detect is 138 copies/mL. A negative result does not preclude SARS-Cov-2 infection and should not be used as the sole basis for treatment or other patient management decisions. A negative result may occur with  improper specimen collection/handling, submission of specimen other than nasopharyngeal swab, presence of viral mutation(s) within the areas targeted by this assay, and inadequate number  of viral copies(<138 copies/mL). A negative result must be combined with clinical observations, patient history, and epidemiological information. The expected result is Negative.  Fact Sheet for Patients:  EntrepreneurPulse.com.au  Fact Sheet for Healthcare Providers:  IncredibleEmployment.be  This test is no t yet approved or cleared by the [place] FDA and  has been authorized for detection and/or diagnosis of SARS-CoV-2 by FDA under an Emergency Use Authorization (EUA). This EUA will remain  in effect (meaning this test can be used) for the duration of the COVID-19 declaration under Section 564(b)(1) of the Act, 21 U.S.C.section 360bbb-3(b)(1), unless the authorization is terminated  or revoked sooner.       Influenza A by PCR NEGATIVE NEGATIVE Final   Influenza B by PCR NEGATIVE NEGATIVE Final    Comment: (NOTE) The Xpert Xpress SARS-CoV-2/FLU/RSV plus assay is intended as an aid in the diagnosis of influenza from Nasopharyngeal swab specimens and should not be used as a sole basis for  treatment. Nasal washings and aspirates are unacceptable for Xpert Xpress SARS-CoV-2/FLU/RSV testing.  Fact Sheet for Patients: EntrepreneurPulse.com.au  Fact Sheet for Healthcare Providers: IncredibleEmployment.be  This test is not yet approved or cleared by the [place] FDA and has been authorized for detection and/or diagnosis of SARS-CoV-2 by FDA under an Emergency Use Authorization (EUA). This EUA will remain in effect (meaning this test can be used) for the duration of the COVID-19 declaration under Section 564(b)(1) of the Act, 21 U.S.C. section 360bbb-3(b)(1), unless the authorization is terminated or revoked.     Resp Syncytial Virus by PCR NEGATIVE NEGATIVE Final    Comment: (NOTE) Fact Sheet for Patients: EntrepreneurPulse.com.au  Fact Sheet for Healthcare  Providers: IncredibleEmployment.be  This test is not yet approved or cleared by the [place] FDA and has been authorized for detection and/or diagnosis of SARS-CoV-2 by FDA under an Emergency Use Authorization (EUA). This EUA will remain in effect (meaning this test can be used) for the duration of the COVID-19 declaration under Section 564(b)(1) of the Act, 21 U.S.C. section 360bbb-3(b)(1), unless the authorization is terminated or revoked.  Performed at [facility], [address], [place], [place] [address]      Labs: BNP (last 3 results) Recent Labs    04/25/22 2156 04/29/22 0115 04/29/22 1013  BNP 2,310.0* 1,693.5* 2,225.0*    Basic Metabolic Panel: Recent Labs  Lab 04/25/22 06/01/54 04/26/22 0152 04/27/22 0506 04/28/22 2252 04/29/22 1013 04/29/22 2147 05/01/22 0433  NA 136 136 138 139 138  --  140  K 2.7* 3.7 3.9 5.3* 4.0  --  3.8  CL 103 102 107 105 105  --  109  CO2 24 21* 24 22 21*  --  24  GLUCOSE 120* 99 100* 155* 118*  --  155*  BUN 23 22 25* 24* 31*  --  42*  CREATININE 2.03* 1.98* 1.87* 2.20* 2.02* 2.20* 1.98*  CALCIUM 8.6* 8.9 8.2* 9.1 8.9  --  8.3*  MG 1.8  --  2.3  --   --   --  2.2  PHOS  --   --  2.8  --   --   --   --     Liver Function Tests: Recent Labs  Lab 04/25/22 2156 04/27/22 0506 04/28/22 2252 05/01/22 0433  AST 35 30 39 20  ALT 28 28 29 24   ALKPHOS 168* 145* 159* 120  BILITOT 0.8 0.9 1.1 0.6  PROT 6.8 6.3* 6.8 6.5  ALBUMIN 3.5 3.1* 3.4* 3.3*    No results for input(s): "LIPASE", "AMYLASE" in the last 168 hours. No results for input(s): "AMMONIA" in the last 168 hours. CBC: Recent Labs  Lab 04/27/22 0506 04/28/22 2315 04/29/22 1013 04/29/22 05/31/2145 05/01/22 0433  WBC 7.1 7.5 8.7 5.8 8.1  NEUTROABS  --  5.0 7.8*  --   --   HGB 11.8* 12.3* 13.5 11.8* 12.3*  HCT 37.1* 38.3* 43.1 37.2* 39.0  MCV 89.0 88.5 90.2 89.6 89.7  PLT 181 223 237 236 243    Thyroid function studies Recent Labs     04/29/22 05/31/2145  TSH 0.524    Urinalysis    Component Value Date/Time   COLORURINE YELLOW 04/13/2022 1504   APPEARANCEUR CLOUDY (A) 04/13/2022 1504   APPEARANCEUR Cloudy (A) 06/16/2021 0936   LABSPEC 1.011 04/13/2022 1504   LABSPEC 1.018 04/23/2011 1044   PHURINE 6.0 04/13/2022 1504   GLUCOSEU NEGATIVE 04/13/2022 1504   GLUCOSEU Negative 04/23/2011 1044   HGBUR SMALL (A)  04/13/2022 1504   BILIRUBINUR NEGATIVE 04/13/2022 1504   BILIRUBINUR Negative 06/16/2021 0936   BILIRUBINUR Negative 04/23/2011 1044   KETONESUR NEGATIVE 04/13/2022 1504   PROTEINUR 30 (A) 04/13/2022 1504   NITRITE NEGATIVE 04/13/2022 1504   LEUKOCYTESUR LARGE (A) 04/13/2022 1504   LEUKOCYTESUR Trace 04/23/2011 1044   Sepsis Labs Recent Labs  Lab 04/28/22 2315 04/29/22 1013 04/29/22 2147 05/01/22 0433  WBC 7.5 8.7 5.8 8.1    Microbiology Recent Results (from the past 240 hour(s))  Resp panel by RT-PCR (RSV, Flu A&B, Covid) Anterior Nasal Swab     Status: None   Collection Time: 04/25/22  9:47 PM   Specimen: Anterior Nasal Swab  Result Value Ref Range Status   SARS Coronavirus 2 by RT PCR NEGATIVE NEGATIVE Final    Comment: (NOTE) SARS-CoV-2 target nucleic acids are NOT DETECTED.  The SARS-CoV-2 RNA is generally detectable in upper respiratory specimens during the acute phase of infection. The lowest concentration of SARS-CoV-2 viral copies this assay can detect is 138 copies/mL. A negative result does not preclude SARS-Cov-2 infection and should not be used as the sole basis for treatment or other patient management decisions. A negative result may occur with  improper specimen collection/handling, submission of specimen other than nasopharyngeal swab, presence of viral mutation(s) within the areas targeted by this assay, and inadequate number of viral copies(<138 copies/mL). A negative result must be combined with clinical observations, patient history, and epidemiological information. The  expected result is Negative.  Fact Sheet for Patients:  EntrepreneurPulse.com.au  Fact Sheet for Healthcare Providers:  IncredibleEmployment.be  This test is no t yet approved or cleared by the [place] FDA and  has been authorized for detection and/or diagnosis of SARS-CoV-2 by FDA under an Emergency Use Authorization (EUA). This EUA will remain  in effect (meaning this test can be used) for the duration of the COVID-19 declaration under Section 564(b)(1) of the Act, 21 U.S.C.section 360bbb-3(b)(1), unless the authorization is terminated  or revoked sooner.       Influenza A by PCR NEGATIVE NEGATIVE Final   Influenza B by PCR NEGATIVE NEGATIVE Final    Comment: (NOTE) The Xpert Xpress SARS-CoV-2/FLU/RSV plus assay is intended as an aid in the diagnosis of influenza from Nasopharyngeal swab specimens and should not be used as a sole basis for treatment. Nasal washings and aspirates are unacceptable for Xpert Xpress SARS-CoV-2/FLU/RSV testing.  Fact Sheet for Patients: EntrepreneurPulse.com.au  Fact Sheet for Healthcare Providers: IncredibleEmployment.be  This test is not yet approved or cleared by the [place] FDA and has been authorized for detection and/or diagnosis of SARS-CoV-2 by FDA under an Emergency Use Authorization (EUA). This EUA will remain in effect (meaning this test can be used) for the duration of the COVID-19 declaration under Section 564(b)(1) of the Act, 21 U.S.C. section 360bbb-3(b)(1), unless the authorization is terminated or revoked.     Resp Syncytial Virus by PCR NEGATIVE NEGATIVE Final    Comment: (NOTE) Fact Sheet for Patients: EntrepreneurPulse.com.au  Fact Sheet for Healthcare Providers: IncredibleEmployment.be  This test is not yet approved or cleared by the [place] FDA and has been authorized for detection and/or  diagnosis of SARS-CoV-2 by FDA under an Emergency Use Authorization (EUA). This EUA will remain in effect (meaning this test can be used) for the duration of the COVID-19 declaration under Section 564(b)(1) of the Act, 21 U.S.C. section 360bbb-3(b)(1), unless the authorization is terminated or revoked.  Performed at [facility], [address],  [place], [place] [address]      Time coordinating discharge: Over 30 minutes  SIGNED:   Little Ishikawa, DO [facility] 05/02/2022, 7:47 AM Pager   If 7PM-7AM, please contact night-coverage [email or web address]

## 2022-05-03 DIAGNOSIS — R55 Syncope and collapse: Secondary | ICD-10-CM | POA: Diagnosis not present

## 2022-05-03 NOTE — TOC Progression Note (Addendum)
Transition of Care [facility]) - Progression Note    Patient Details  Name: Michael [name] MRN: [id number] Date of Birth: Jun 27, 1955  Transition of Care [facility] CM/SW Contact  Cher Egnor, Juliann Pulse, RN Phone Number: 05/03/2022, 12:51 PM  Clinical Narrative: have not heard response from [place] therefore went into rm to discuss w/patient if he had a reference#. Patient did not-Had patient to call while in rm-he received a reference#, await appeal process.Paitent did stated he may have an address @ d/c, but he has received resources for Day shelter, along with [facility]. Await outcome on Appeal. -1:38p-patient is observation status-unable to appeal.Patient voiced understanding. He will provide an address for a taxi to [place] near [facility].   -awaiting approval from supv for taxi voucher to address [place].Patient also stating to talk to Dr prior d/c. -2:58p-Taxi voucher approved by Supv:[employer] quoted [phone] cost-nsg will call Assunta Gambles when ready. No further CM needs.    Expected Discharge Plan: Home/Self Care Barriers to Discharge: Other (must enter comment) (Appealing discharge)  Expected Discharge Plan and Services   Discharge Planning Services: CM Consult   Living arrangements for the past 2 months: Single Family Home Expected Discharge Date: 05/01/22                 DME Agency: NA         HH Agency: NA         Social Determinants of Health ([place]) Interventions [place]: No Food Insecurity (05/01/2022)  Housing: Medium Risk (05/01/2022)  Transportation Needs: Unmet Transportation Needs (05/01/2022)  Utilities: Not At Risk (05/01/2022)  Alcohol Screen: Low Risk  (12/11/2019)  Depression (PHQ2-9): Low Risk  (12/11/2019)  Financial Resource Strain: Low Risk  (12/11/2019)  Physical Activity: Inactive (12/11/2019)  Social Connections: Socially Isolated (12/11/2019)  Stress: No Stress Concern Present (12/11/2019)   Tobacco Use: Medium Risk (04/29/2022)    Readmission Risk Interventions    04/26/2022   11:46 AM 10/26/2021    2:57 PM 02/18/2021   11:33 AM  Readmission Risk Prevention Plan  Transportation Screening Complete Complete Complete  PCP or Specialist Appt within 5-7 Days   Complete  PCP or Specialist Appt within 3-5 Days  Complete   Home Care Screening   Complete  Medication Review (RN CM)   Complete  HRI or Home Care Consult Complete Complete   Social Work Consult for [place] Planning/Counseling Complete Complete   Palliative Care Screening Not Applicable Not Applicable   Medication Review Press photographer) Complete Complete

## 2022-05-03 NOTE — Progress Notes (Signed)
Physical Therapy Treatment Patient Details Name: Michael [name] MRN: [id number] DOB: Feb 05, 1955 Today's Date: 05/03/2022   History of Present Illness 67 year old male admitted  with syncopal episode. PMH :chronic systolic CHF, COPD, HTN, history of head and neck cancer status post partial resection with right partial rhinectomy and reconstruction, chronic kidney disease stage IIIb    PT Comments     Pt admitted with above diagnosis.  Pt currently with functional limitations due to the deficits listed below (see PT Problem List). Pt in bed when PT arrived, noted blood on pillow case and small open area on back of R hand and nursing made aware. Pt agreeable to therapy intervention. Pt is mod I for bed mobility, S for transfers and gait tasks 500 feet no AD. PT assessed for orthostatic hypotension with positive findings and pt asymptomatic (Bp findings below). Pt able to maintain static standing with close S and no UE support for 5:19 with min cues for posture and anterior weight shift. Pt reports no pain. Pt indicated SOB with prolonged gait and O2 saturation 99% on RA. Pt left in bed and all needs meet. Pt will benefit from acute skilled PT to increase their independence and safety with mobility to allow discharge.   Supine BP173/90 65 PR Seated EOB 160/110 64 PR Immediate standing 143/81 69 PR Standing BP s/p 3 mins 134/ 77 69 PR S/p gait tasks and return to sitting 158/112 65 PR   Recommendations for follow up therapy are one component of a multi-disciplinary discharge planning process, led by the attending physician.  Recommendations may be updated based on patient status, additional functional criteria and insurance authorization.  Follow Up Recommendations       Assistance Recommended at Discharge PRN  Patient can return home with the following Help with stairs or ramp for entrance   Equipment Recommendations  None recommended by PT    Recommendations for Other Services        Precautions / Restrictions Precautions Precautions: Fall Precaution Comments: orthostatic Restrictions Weight Bearing Restrictions: No     Mobility  Bed Mobility Overal bed mobility: Modified Independent                  Transfers Overall transfer level: Needs assistance   Transfers: Sit to/from Stand Sit to Stand: Supervision   Step pivot transfers: Supervision       General transfer comment: for safety d/t dizziness, orthostatic; STS x2 from EOB    Ambulation/Gait Ambulation/Gait assistance: Supervision Gait Distance (Feet): 450 Feet Assistive device: None Gait Pattern/deviations: Step-through pattern, Wide base of support Gait velocity: decreased     General Gait Details: occational need for stepping stratagies due to LOB and pt able to self recover, lateral sway noted   Stairs             Wheelchair Mobility    Modified Rankin (Stroke Patients Only)       Balance Overall balance assessment: Mild deficits observed, not formally tested Sitting-balance support: No upper extremity supported, Feet supported Sitting balance-Leahy Scale: Normal       Standing balance-Leahy Scale: Fair                              Cognition Arousal/Alertness: Awake/alert Behavior During Therapy: WFL for tasks assessed/performed Overall Cognitive Status: Within Functional Limits for tasks assessed  Exercises      General Comments General comments (skin integrity, edema, etc.): PT noted R dorsal surface of hand bleeding and urse aware, apparently prior to tx intervention      Pertinent Vitals/Pain      Home Living                          Prior Function            PT Goals (current goals can now be found in the care plan section) Acute Rehab PT Goals Patient Stated Goal: to be able to walk without dizziness PT Goal Formulation: With patient Time For Goal  Achievement: 05/15/22 Potential to Achieve Goals: Good Progress towards PT goals: Progressing toward goals    Frequency           PT Plan      Co-evaluation              AM-PAC PT "6 Clicks" Mobility   Outcome Measure  Help needed turning from your back to your side while in a flat bed without using bedrails?: None Help needed moving from lying on your back to sitting on the side of a flat bed without using bedrails?: None Help needed moving to and from a bed to a chair (including a wheelchair)?: A Little Help needed standing up from a chair using your arms (e.g., wheelchair or bedside chair)?: A Little Help needed to walk in hospital room?: A Lot Help needed climbing 3-5 steps with a railing? : A Lot 6 Click Score: 18    End of Session Equipment Utilized During Treatment: Gait belt Activity Tolerance: Patient tolerated treatment well Patient left: with call bell/phone within reach (pt elected to return to bed) Nurse Communication: Mobility status PT Visit Diagnosis: Unsteadiness on feet (R26.81);Other abnormalities of gait and mobility (R26.89);Muscle weakness (generalized) (M62.81)     Time: [id number] PT Time Calculation (min) (ACUTE ONLY): 29 min  Charges:  $Gait Training: 8-22 mins $Therapeutic Activity: 8-22 mins                     Baird Lyons, PT    Adair Patter 05/03/2022, 10:24 AM

## 2022-05-03 NOTE — Discharge Summary (Signed)
Physician Discharge Summary  Michael [name] [id number] DOB: 26-Nov-1955 DOA: 04/29/2022  PCP: [name], MD  Admit date: 04/29/2022 Discharge date: 05/03/2022  Admitted From: Home Disposition: Home  Recommendations for Outpatient Follow-up:  Follow up with PCP in 1-2 weeks  Discharge Condition: Stable CODE STATUS: Full Diet recommendation: Low-salt low-fat diet  Brief/Interim Summary: 67 year old male with multiple medical problems including chronic systolic CHF, COPD, HTN, history of head and neck cancer status post partial resection with right partial rhinectomy and reconstruction, chronic kidney disease stage IIIb with baseline creatinine 1.9-2.0, comes into the hospital with complaints of a syncopal episode. On 3/20 9 in the morning presented to [facility], ER due to wheezing, shortness of breath, felt better after inhaler and was discharged home with oral steroids. After he left the hospital and early in the morning, he started to feel dizzy and lightheaded, and eventually he tells me he passed out but guided himself to the ground. By passers called EMS and he was brought to the ER   Patient admitted as above with presumed syncopal event, patient had profound orthostatic positive vital signs in the ED consistent with his complaints.  After IV fluids, decreased BP medications and increase p.o. intake patient's blood pressure now remains relatively within normal limits during orthostatic testing symptoms have resolved he is otherwise stable and agreeable for discharge home. Of note he does have borderline elevated BP at rest/supine but this is necessary due to profound symptoms with orthostatic position changes. Close follow-up with PCP as scheduled, discontinued patient's Entresto and amlodipine as well as decreasing his dose of Coreg given his hypotension which appears to have improved over the past 48 hours.  At this time patient is otherwise stable and agreeable for discharge.  Case  management has provided transport given patient's current living situation as he is now recently homeless -having lost access to the his previous rented property.  **Patient appealed discharge - awaiting insurance evaluation. Remains medically stable for discharge, disposition complicated given newly homeless.  Discharge Diagnoses:  Principal Problem:   Syncope and collapse Active Problems:   Essential hypertension   Anemia   Tobacco abuse   Heart murmur   Carotid artery disease   COPD (chronic obstructive pulmonary disease)   Urinary retention with incomplete bladder emptying   Chronic HFrEF (heart failure with reduced ejection fraction)   Carotid stenosis, asymptomatic, right   CAD (coronary artery disease)   CKD (chronic kidney disease) stage 3, GFR 30-59 ml/min   Angina at rest   Troponin level elevated   Elevated brain natriuretic peptide (BNP) level   Syncope    Discharge Instructions  Discharge Instructions     Discharge patient   Complete by: As directed    Discharge disposition: 01-Home or Self Care   Discharge patient date: 05/01/2022      Allergies as of 05/03/2022   No Known Allergies      Medication List     STOP taking these medications    amLODipine 5 MG tablet Commonly known as: NORVASC   Entresto 24-26 MG Generic drug: sacubitril-valsartan       TAKE these medications    acetaminophen 650 MG CR tablet Commonly known as: TYLENOL Take 1,300 mg by mouth as needed for pain.   albuterol 108 (90 Base) MCG/ACT inhaler Commonly known as: VENTOLIN HFA Inhale 2 puffs into the lungs every 6 (six) hours as needed for wheezing or shortness of breath.   atorvastatin 80 MG tablet Commonly known as:  LIPITOR Take 1 tablet (80 mg total) by mouth every evening.   Breztri Aerosphere 160-9-4.8 MCG/ACT Aero Generic drug: Budeson-Glycopyrrol-Formoterol Inhale 2 puffs into the lungs 2 (two) times daily.   carvedilol 6.25 MG tablet Commonly known as:  COREG Take 1 tablet (6.25 mg total) by mouth daily. What changed:  medication strength how much to take when to take this   clopidogrel 75 MG tablet Commonly known as: PLAVIX Take 1 tablet (75 mg total) by mouth daily.   ibuprofen 200 MG tablet Commonly known as: ADVIL Take 800 mg by mouth as needed for moderate pain.   isosorbide mononitrate 60 MG 24 hr tablet Commonly known as: IMDUR Take 1 tablet (60 mg total) by mouth every evening.   predniSONE 10 MG tablet Commonly known as: DELTASONE Take 4 tablets (40 mg total) by mouth daily for 3 days, THEN 3 tablets (30 mg total) daily for 3 days, THEN 2 tablets (20 mg total) daily for 3 days, THEN 1 tablet (10 mg total) daily for 3 days. Start taking on: May 01, 2022 What changed:  medication strength See the new instructions.        No Known Allergies  Consultations: None  Procedures/Studies: ECHOCARDIOGRAM COMPLETE  Result Date: 04/30/2022    ECHOCARDIOGRAM REPORT   [name] Date of Exam: 04/30/2022 Medical Rec #:  [id number]     Height:       72.0 in Accession #:    [id number]    Weight:       145.5 lb Date of Birth:  [birth year]     BSA:          1.861 m Patient Age:    47 years      BP:           121/64 mmHg Patient Gender: M             HR:           64 bpm. Exam Location:  Inpatient Procedure: 2D Echo, Cardiac Doppler and Color Doppler Indications:    CHF  History:        Patient has prior history of Echocardiogram examinations, most                 recent 10/21/2021. CAD, COPD, Signs/Symptoms:Syncope; Risk                 Factors:Hypertension and Current Smoker.  Sonographer:    Marella Chimes Referring Phys: [place]  1. Left ventricular ejection fraction, by estimation, is 35 to 40%. The left ventricle has moderately decreased function. The left ventricle demonstrates global hypokinesis. Left ventricular diastolic parameters are indeterminate.  2. Right ventricular systolic  function is normal. The right ventricular size is normal. There is normal pulmonary artery systolic pressure.  3. The mitral valve is normal in structure. No evidence of mitral valve regurgitation. No evidence of mitral stenosis.  4. The aortic valve has an indeterminant number of cusps. There is mild calcification of the aortic valve. There is mild thickening of the aortic valve. Aortic valve regurgitation is not visualized. No aortic stenosis is present.  5. The inferior vena cava is normal in size with greater than 50% respiratory variability, suggesting right atrial pressure of 3 mmHg. FINDINGS  Left Ventricle: Left ventricular ejection fraction, by estimation, is 35 to 40%. The left ventricle has moderately decreased function. The left ventricle demonstrates global hypokinesis. The left ventricular internal cavity size was normal  in size. There is no left ventricular hypertrophy. Left ventricular diastolic parameters are indeterminate. Right Ventricle: The right ventricular size is normal. Right vetricular wall thickness was not well visualized. Right ventricular systolic function is normal. There is normal pulmonary artery systolic pressure. The tricuspid regurgitant velocity is 2.48 m/s, and with an assumed right atrial pressure of 3 mmHg, the estimated right ventricular systolic pressure is 0000000 mmHg. Left Atrium: Left atrial size was normal in size. Right Atrium: Right atrial size was normal in size. Pericardium: There is no evidence of pericardial effusion. Mitral Valve: The mitral valve is normal in structure. No evidence of mitral valve regurgitation. No evidence of mitral valve stenosis. Tricuspid Valve: The tricuspid valve is normal in structure. Tricuspid valve regurgitation is mild . No evidence of tricuspid stenosis. Aortic Valve: The aortic valve has an indeterminant number of cusps. There is mild calcification of the aortic valve. There is mild thickening of the aortic valve. There is mild aortic  valve annular calcification. Aortic valve regurgitation is not visualized. No aortic stenosis is present. Aortic valve mean gradient measures 4.0 mmHg. Aortic valve peak gradient measures 7.6 mmHg. Aortic valve area, by VTI measures 2.94 cm. Pulmonic Valve: The pulmonic valve was not well visualized. Pulmonic valve regurgitation is not visualized. No evidence of pulmonic stenosis. Aorta: The aortic root is normal in size and structure. Venous: The inferior vena cava is normal in size with greater than 50% respiratory variability, suggesting right atrial pressure of 3 mmHg. IAS/Shunts: No atrial level shunt detected by color flow Doppler.  LEFT VENTRICLE PLAX 2D LVIDd:         5.60 cm      Diastology LVIDs:         5.35 cm      LV e' medial:    3.48 cm/s LV PW:         1.10 cm      LV E/e' medial:  27.1 LV IVS:        1.05 cm      LV e' lateral:   9.25 cm/s LVOT diam:     2.20 cm      LV E/e' lateral: 10.2 LV SV:         93 LV SV Index:   50 LVOT Area:     3.80 cm  LV Volumes (MOD) LV vol d, MOD A4C: 149.0 ml LV vol s, MOD A4C: 116.0 ml LV SV MOD A4C:     149.0 ml RIGHT VENTRICLE RV S prime:     7.72 cm/s TAPSE (M-mode): 2.7 cm LEFT ATRIUM           Index        RIGHT ATRIUM           Index LA Vol (A2C): 66.3 ml 35.62 ml/m  RA Area:     20.20 cm LA Vol (A4C): 53.4 ml 28.69 ml/m  RA Volume:   55.50 ml  29.82 ml/m  AORTIC VALVE AV Area (Vmax):    3.25 cm AV Area (Vmean):   2.88 cm AV Area (VTI):     2.94 cm AV Vmax:           138.00 cm/s AV Vmean:          93.300 cm/s AV VTI:            0.317 m AV Peak Grad:      7.6 mmHg AV Mean Grad:      4.0 mmHg LVOT Vmax:  118.00 cm/s LVOT Vmean:        70.600 cm/s LVOT VTI:          0.245 m LVOT/AV VTI ratio: 0.77  AORTA Ao Root diam: 3.30 cm Ao Asc diam:  3.40 cm MITRAL VALVE               TRICUSPID VALVE MV Area (PHT): 2.10 cm    TR Peak grad:   24.6 mmHg MV Decel Time: 361 msec    TR Vmax:        248.00 cm/s MV E velocity: 94.30 cm/s MV A velocity: 94.30 cm/s   SHUNTS MV E/A ratio:  1.00        Systemic VTI:  0.24 m                            Systemic Diam: 2.20 cm Carlyle Dolly MD Electronically signed by Carlyle Dolly MD Signature Date/Time: 04/30/2022/10:09:53 AM    Final    CT Head Wo Contrast  Result Date: 04/29/2022 CLINICAL DATA:  Trauma EXAM: CT HEAD WITHOUT CONTRAST TECHNIQUE: Contiguous axial images were obtained from the base of the skull through the vertex without intravenous contrast. RADIATION DOSE REDUCTION: This exam was performed according to the departmental dose-optimization program which includes automated exposure control, adjustment of the mA and/or kV according to patient size and/or use of iterative reconstruction technique. COMPARISON:  CT Head 12/17/19 FINDINGS: Brain: No evidence of acute infarction, hemorrhage, hydrocephalus, extra-axial collection or mass lesion/mass effect. There is sequela of severe chronic microvascular ischemic change. Chronic infarcts in the left basal ganglia. Vascular: No hyperdense vessel or unexpected calcification. Skull: Postsurgical changes prior complex maxillofacial reconstruction. Sinuses/Orbits: Middle ear or mastoid. Paranasal sinuses are clear. There postsurgical changes from a prior orbital floor fracture repair. Orbits are otherwise unremarkable. Other: Likely an epidermal inclusion cyst along the left parietal scalp. IMPRESSION: 1. No acute intracranial abnormality. 2. Sequela of severe chronic microvascular ischemic change. Electronically Signed   By: Marin Roberts M.D.   On: 04/29/2022 13:22   DG Chest 2 View  Result Date: 04/29/2022 CLINICAL DATA:  Shortness of breath. EXAM: CHEST - 2 VIEW COMPARISON:  April 28, 2022. FINDINGS: Stable cardiomediastinal silhouette. Hyperexpansion of the lungs is noted. No acute pulmonary disease is noted. Bony thorax is unremarkable. IMPRESSION: Hyperexpansion of the lungs. No acute cardiopulmonary abnormality seen. Aortic Atherosclerosis (ICD10-I70.0).  Electronically Signed   By: Marijo Conception M.D.   On: 04/29/2022 12:26   DG Chest 2 View  Result Date: 04/28/2022 CLINICAL DATA:  cough, SOB EXAM: CHEST - 2 VIEW COMPARISON:  Chest x-ray 04/28/2022, CT chest 04/25/2022 FINDINGS: The heart and mediastinal contours are unchanged. Aortic calcification. Chronic right Base subcentimeter calcification likely granuloma. No focal consolidation. No pulmonary edema. No pleural effusion. No pneumothorax. No acute osseous abnormality. IMPRESSION: 1. No active cardiopulmonary disease. 2.  Aortic Atherosclerosis (ICD10-I70.0). Electronically Signed   By: Iven Finn M.D.   On: 04/28/2022 23:23   DG Chest Port 1 View  Result Date: 04/28/2022 CLINICAL DATA:  Shortness of breath. EXAM: PORTABLE CHEST 1 VIEW COMPARISON:  04/25/2018 for FINDINGS: The cardiac silhouette, mediastinal and contours are within normal limits given the AP projection and portable technique. Stable underlying emphysematous changes and pulmonary scarring. No acute overlying pulmonary process. No pleural effusion or pneumothorax. IMPRESSION: Emphysematous changes and pulmonary scarring but no acute overlying pulmonary process. Electronically Signed   By: Ricky Stabs.D.  On: 04/28/2022 14:58   CT Angio Chest PE W and/or Wo Contrast  Result Date: 04/25/2022 CLINICAL DATA:  Shortness of breath EXAM: CT ANGIOGRAPHY CHEST WITH CONTRAST TECHNIQUE: Multidetector CT imaging of the chest was performed using the standard protocol during bolus administration of intravenous contrast. Multiplanar CT image reconstructions and MIPs were obtained to evaluate the vascular anatomy. RADIATION DOSE REDUCTION: This exam was performed according to the departmental dose-optimization program which includes automated exposure control, adjustment of the mA and/or kV according to patient size and/or use of iterative reconstruction technique. CONTRAST:  75mL OMNIPAQUE IOHEXOL 350 MG/ML SOLN COMPARISON:  CT angiogram  chest 11/13/2021.  CT abdomen 01/25/2022. FINDINGS: Cardiovascular: Heart is mildly enlarged. Aorta is normal in size. There is no pericardial effusion. There are atherosclerotic calcifications of the aorta. There is adequate opacification of the pulmonary arteries to the segmental level. There is no evidence for pulmonary embolism. Mediastinum/Nodes: There is a heterogeneous left thyroid nodule measuring up to 2.5 cm similar to prior. There are no enlarged mediastinal or hilar lymph nodes. Esophagus is nondilated. Lungs/Pleura: Mild emphysematous changes are present. There stable nodular densities in the right lower lobe measuring up to 3 mm. There is a new rounded 4 mm nodule in the superior segment of the left lower lobe image 6/43. The lungs are otherwise clear. There is no pleural effusion or pneumothorax. Upper Abdomen: Stable hypodense lesions in the liver. Stable pancreatic calcifications. Musculoskeletal: No chest wall abnormality. No acute or significant osseous findings. Review of the MIP images confirms the above findings. IMPRESSION: 1. No evidence for pulmonary embolism. 2. Mild cardiomegaly. 3. New 4 mm left solid pulmonary nodule. Per Fleischner Society Guidelines, no routine follow-up imaging is recommended. These guidelines do not apply to immunocompromised patients and patients with cancer. Follow up in patients with significant comorbidities as clinically warranted. For lung cancer screening, adhere to Lung-RADS guidelines. Reference: Radiology. 2017; 284(1):228-43. 4. Stable right lower lobe pulmonary nodules measuring up to 3 mm. Aortic Atherosclerosis (ICD10-I70.0). Electronically Signed   By: Ronney Asters M.D.   On: 04/25/2022 23:35   DG Chest Port 1 View  Result Date: 04/25/2022 CLINICAL DATA:  Shortness of breath. EXAM: PORTABLE CHEST 1 VIEW COMPARISON:  04/13/2022. FINDINGS: The heart is enlarged and the mediastinal contour is within normal limits. There is atherosclerotic  calcification of the aorta. Hyperinflation of the lungs is noted. There is chronic prominent interstitial thickening. No consolidation, effusion, or pneumothorax. No acute osseous abnormality. IMPRESSION: 1. Chronic interstitial prominence and hyperinflation of the lungs with no acute abnormality. 2. Cardiomegaly. Electronically Signed   By: Brett Fairy M.D.   On: 04/25/2022 22:18   MR BRAIN WO CONTRAST  Result Date: 04/13/2022 CLINICAL DATA:  Stroke suspected EXAM: MRI HEAD WITHOUT CONTRAST TECHNIQUE: Multiplanar, multiecho pulse sequences of the brain and surrounding structures were obtained without intravenous contrast. COMPARISON:  MRI Head 06/04/17 FINDINGS: Brain: Negative for an acute infarct. Sequela of severe chronic microvascular ischemic change near confluence T2/FLAIR hyperintense periventricular signal abnormality. There are few small microhemorrhages in the bilateral thalami and cerebellar hemispheres, favored to be hypertensive in nature. No extra-axial fluid collection. Chronic infarct in the left basal ganglia. And bilateral corona radiata Vascular: Normal flow voids. Skull and upper cervical spine: There is a 1.5 cm cystic lesion in the subcutaneous soft tissues over the left occipital calvarium, favored to represent a small epidermal inclusion cyst. Postsurgical changes of the right orbital floor. Sinuses/Orbits: No middle ear or mastoid effusion. Mucosal thickening left  maxillary sinus and bilateral ethmoid sinuses. Other: None IMPRESSION: 1. No acute intracranial process. 2. Sequela of severe chronic microvascular ischemic change. Electronically Signed   By: Marin Roberts M.D.   On: 04/13/2022 15:25   DG Chest 1 View  Result Date: 04/13/2022 CLINICAL DATA:  near-syncope EXAM: CHEST  1 VIEW COMPARISON:  Chest x-ray 02/24/2022. FINDINGS: Chronic mild prominence of the lung markings. No consolidation. No visible pleural effusions or pneumothorax. Cardiomediastinal silhouette is unchanged.  Polyarticular degenerative change. Osteopenia. No acute displaced fracture identified. IMPRESSION: No evidence of acute cardiopulmonary disease. Electronically Signed   By: Margaretha Sheffield M.D.   On: 04/13/2022 13:41     Subjective: No acute issues or events overnight   Discharge Exam: Vitals:   05/02/22 2018 05/03/22 0509  BP:  (!) 166/103  Pulse:  63  Resp:  18  Temp:  (!) 97.4 F (36.3 C)  SpO2: 98% 98%   Vitals:   05/02/22 1349 05/02/22 1945 05/02/22 2018 05/03/22 0509  BP: (!) 170/88 (!) 176/85  (!) 166/103  Pulse: 64 65  63  Resp: 19 18  18   Temp:  (!) 97.3 F (36.3 C)  (!) 97.4 F (36.3 C)  TempSrc:    Oral  SpO2: 99% 99% 98% 98%  Weight:      Height:        General: Pt is alert, awake, not in acute distress Cardiovascular: RRR, S1/S2 +, no rubs, no gallops Respiratory: CTA bilaterally, no wheezing, no rhonchi Abdominal: Soft, NT, ND, bowel sounds + Extremities: no edema, no cyanosis    The results of significant diagnostics from this hospitalization (including imaging, microbiology, ancillary and laboratory) are listed below for reference.     Microbiology: Recent Results (from the past 240 hour(s))  Resp panel by RT-PCR (RSV, Flu A&B, Covid) Anterior Nasal Swab     Status: None   Collection Time: 04/25/22  9:47 PM   Specimen: Anterior Nasal Swab  Result Value Ref Range Status   SARS Coronavirus 2 by RT PCR NEGATIVE NEGATIVE Final    Comment: (NOTE) SARS-CoV-2 target nucleic acids are NOT DETECTED.  The SARS-CoV-2 RNA is generally detectable in upper respiratory specimens during the acute phase of infection. The lowest concentration of SARS-CoV-2 viral copies this assay can detect is 138 copies/mL. A negative result does not preclude SARS-Cov-2 infection and should not be used as the sole basis for treatment or other patient management decisions. A negative result may occur with  improper specimen collection/handling, submission of specimen  other than nasopharyngeal swab, presence of viral mutation(s) within the areas targeted by this assay, and inadequate number of viral copies(<138 copies/mL). A negative result must be combined with clinical observations, patient history, and epidemiological information. The expected result is Negative.  Fact Sheet for Patients:  EntrepreneurPulse.com.au  Fact Sheet for Healthcare Providers:  IncredibleEmployment.be  This test is no t yet approved or cleared by the [place] FDA and  has been authorized for detection and/or diagnosis of SARS-CoV-2 by FDA under an Emergency Use Authorization (EUA). This EUA will remain  in effect (meaning this test can be used) for the duration of the COVID-19 declaration under Section 564(b)(1) of the Act, 21 U.S.C.section 360bbb-3(b)(1), unless the authorization is terminated  or revoked sooner.       Influenza A by PCR NEGATIVE NEGATIVE Final   Influenza B by PCR NEGATIVE NEGATIVE Final    Comment: (NOTE) The Xpert Xpress SARS-CoV-2/FLU/RSV plus assay is intended as an aid in  the diagnosis of influenza from Nasopharyngeal swab specimens and should not be used as a sole basis for treatment. Nasal washings and aspirates are unacceptable for Xpert Xpress SARS-CoV-2/FLU/RSV testing.  Fact Sheet for Patients: EntrepreneurPulse.com.au  Fact Sheet for Healthcare Providers: IncredibleEmployment.be  This test is not yet approved or cleared by the [place] FDA and has been authorized for detection and/or diagnosis of SARS-CoV-2 by FDA under an Emergency Use Authorization (EUA). This EUA will remain in effect (meaning this test can be used) for the duration of the COVID-19 declaration under Section 564(b)(1) of the Act, 21 U.S.C. section 360bbb-3(b)(1), unless the authorization is terminated or revoked.     Resp Syncytial Virus by PCR NEGATIVE NEGATIVE Final     Comment: (NOTE) Fact Sheet for Patients: EntrepreneurPulse.com.au  Fact Sheet for Healthcare Providers: IncredibleEmployment.be  This test is not yet approved or cleared by the [place] FDA and has been authorized for detection and/or diagnosis of SARS-CoV-2 by FDA under an Emergency Use Authorization (EUA). This EUA will remain in effect (meaning this test can be used) for the duration of the COVID-19 declaration under Section 564(b)(1) of the Act, 21 U.S.C. section 360bbb-3(b)(1), unless the authorization is terminated or revoked.  Performed at [facility], [address], [place], [place] [address]      Labs: BNP (last 3 results) Recent Labs    04/25/22 2156 04/29/22 0115 04/29/22 1013  BNP 2,310.0* 1,693.5* 2,225.0*    Basic Metabolic Panel: Recent Labs  Lab 04/27/22 0506 04/28/22 2252 04/29/22 1013 04/29/22 2147 05/01/22 0433  NA 138 139 138  --  140  K 3.9 5.3* 4.0  --  3.8  CL 107 105 105  --  109  CO2 24 22 21*  --  24  GLUCOSE 100* 155* 118*  --  155*  BUN 25* 24* 31*  --  42*  CREATININE 1.87* 2.20* 2.02* 2.20* 1.98*  CALCIUM 8.2* 9.1 8.9  --  8.3*  MG 2.3  --   --   --  2.2  PHOS 2.8  --   --   --   --     Liver Function Tests: Recent Labs  Lab 04/27/22 0506 04/28/22 2252 05/01/22 0433  AST 30 39 20  ALT 28 29 24   ALKPHOS 145* 159* 120  BILITOT 0.9 1.1 0.6  PROT 6.3* 6.8 6.5  ALBUMIN 3.1* 3.4* 3.3*    No results for input(s): "LIPASE", "AMYLASE" in the last 168 hours. No results for input(s): "AMMONIA" in the last 168 hours. CBC: Recent Labs  Lab 04/27/22 0506 04/28/22 2315 04/29/22 1013 04/29/22 2147 05/01/22 0433  WBC 7.1 7.5 8.7 5.8 8.1  NEUTROABS  --  5.0 7.8*  --   --   HGB 11.8* 12.3* 13.5 11.8* 12.3*  HCT 37.1* 38.3* 43.1 37.2* 39.0  MCV 89.0 88.5 90.2 89.6 89.7  PLT 181 223 237 236 243    Thyroid function studies No results for input(s): "TSH", "T4TOTAL", "T3FREE", "THYROIDAB"  in the last 72 hours.  Invalid input(s): "FREET3"  Urinalysis    Component Value Date/Time   COLORURINE YELLOW 04/13/2022 1504   APPEARANCEUR CLOUDY (A) 04/13/2022 1504   APPEARANCEUR Cloudy (A) 06/16/2021 0936   LABSPEC 1.011 04/13/2022 1504   LABSPEC 1.018 04/23/2011 1044   PHURINE 6.0 04/13/2022 1504   GLUCOSEU NEGATIVE 04/13/2022 1504   GLUCOSEU Negative 04/23/2011 1044   HGBUR SMALL (A) 04/13/2022 1504   BILIRUBINUR NEGATIVE 04/13/2022 1504   BILIRUBINUR Negative 06/16/2021 0936  BILIRUBINUR Negative 04/23/2011 1044   KETONESUR NEGATIVE 04/13/2022 1504   PROTEINUR 30 (A) 04/13/2022 1504   NITRITE NEGATIVE 04/13/2022 1504   LEUKOCYTESUR LARGE (A) 04/13/2022 1504   LEUKOCYTESUR Trace 04/23/2011 1044   Sepsis Labs Recent Labs  Lab 04/28/22 2315 04/29/22 1013 04/29/22 2147 05/01/22 0433  WBC 7.5 8.7 5.8 8.1    Microbiology Recent Results (from the past 240 hour(s))  Resp panel by RT-PCR (RSV, Flu A&B, Covid) Anterior Nasal Swab     Status: None   Collection Time: 04/25/22  9:47 PM   Specimen: Anterior Nasal Swab  Result Value Ref Range Status   SARS Coronavirus 2 by RT PCR NEGATIVE NEGATIVE Final    Comment: (NOTE) SARS-CoV-2 target nucleic acids are NOT DETECTED.  The SARS-CoV-2 RNA is generally detectable in upper respiratory specimens during the acute phase of infection. The lowest concentration of SARS-CoV-2 viral copies this assay can detect is 138 copies/mL. A negative result does not preclude SARS-Cov-2 infection and should not be used as the sole basis for treatment or other patient management decisions. A negative result may occur with  improper specimen collection/handling, submission of specimen other than nasopharyngeal swab, presence of viral mutation(s) within the areas targeted by this assay, and inadequate number of viral copies(<138 copies/mL). A negative result must be combined with clinical observations, patient history, and  epidemiological information. The expected result is Negative.  Fact Sheet for Patients:  EntrepreneurPulse.com.au  Fact Sheet for Healthcare Providers:  IncredibleEmployment.be  This test is no t yet approved or cleared by the [place] FDA and  has been authorized for detection and/or diagnosis of SARS-CoV-2 by FDA under an Emergency Use Authorization (EUA). This EUA will remain  in effect (meaning this test can be used) for the duration of the COVID-19 declaration under Section 564(b)(1) of the Act, 21 U.S.C.section 360bbb-3(b)(1), unless the authorization is terminated  or revoked sooner.       Influenza A by PCR NEGATIVE NEGATIVE Final   Influenza B by PCR NEGATIVE NEGATIVE Final    Comment: (NOTE) The Xpert Xpress SARS-CoV-2/FLU/RSV plus assay is intended as an aid in the diagnosis of influenza from Nasopharyngeal swab specimens and should not be used as a sole basis for treatment. Nasal washings and aspirates are unacceptable for Xpert Xpress SARS-CoV-2/FLU/RSV testing.  Fact Sheet for Patients: EntrepreneurPulse.com.au  Fact Sheet for Healthcare Providers: IncredibleEmployment.be  This test is not yet approved or cleared by the [place] FDA and has been authorized for detection and/or diagnosis of SARS-CoV-2 by FDA under an Emergency Use Authorization (EUA). This EUA will remain in effect (meaning this test can be used) for the duration of the COVID-19 declaration under Section 564(b)(1) of the Act, 21 U.S.C. section 360bbb-3(b)(1), unless the authorization is terminated or revoked.     Resp Syncytial Virus by PCR NEGATIVE NEGATIVE Final    Comment: (NOTE) Fact Sheet for Patients: EntrepreneurPulse.com.au  Fact Sheet for Healthcare Providers: IncredibleEmployment.be  This test is not yet approved or cleared by the [place] FDA and has been  authorized for detection and/or diagnosis of SARS-CoV-2 by FDA under an Emergency Use Authorization (EUA). This EUA will remain in effect (meaning this test can be used) for the duration of the COVID-19 declaration under Section 564(b)(1) of the Act, 21 U.S.C. section 360bbb-3(b)(1), unless the authorization is terminated or revoked.  Performed at [facility], [address], [place], [place] [address]      Time coordinating discharge: Over 30 minutes  SIGNED:  Little Ishikawa, DO [facility] 05/03/2022, 7:26 AM Pager   If 7PM-7AM, please contact night-coverage [email or web address]

## 2022-05-03 NOTE — Progress Notes (Signed)
   05/03/22 [place]  Was fall witnessed? No  Was patient injured? No  Patient found on floor  Found by Staff-comment Agnes Lawrence)  Stated prior activity ambulating-unassisted  Provider Notification  Provider Name/Title MD [facility]  Date Provider Notified 05/03/22  Time Provider Notified [id number]  Method of Notification Call;Page (page followed by phone call)  Notification Reason Fall  Provider response No new orders  Date of Provider Response 05/03/22  Time of Provider Response [id number]  Follow Up  Family notified No - patient refusal  Additional tests No  Simple treatment Other (comment) (N/A patient denies pain/discomfort & no injuries to skin noted.)  Adult Fall Risk Assessment  Risk Factor Category (scoring not indicated) High fall risk per protocol (document High fall risk)  Patient Fall Risk Level High fall risk  Adult Fall Risk Interventions  Required Bundle Interventions *See Row Information* High fall risk - low, moderate, and high requirements implemented  Additional Interventions Use of appropriate toileting equipment (bedpan, BSC, etc.);PT/OT need assessed if change in mobility from baseline  Screening for Fall Injury Risk (To be completed on HIGH fall risk patients) - Assessing Need for Floor Mats  Risk For Fall Injury- Criteria for Floor Mats Previous fall this admission  Will Implement Floor Mats Yes  Vitals  Temp 98 F (36.7 C)  Temp Source Oral  BP (!) 186/94  MAP (mmHg) 118  BP Location Left Arm  BP Method Automatic  Patient Position (if appropriate) Sitting  Pulse Rate 65  Pulse Rate Source Monitor  ECG Heart Rate 68  Resp 19  Oxygen Therapy  SpO2 99 %  Pain Assessment  Pain Scale 0-10  Pain Score 0  Neurological  Neuro (WDL) WDL  Level of Consciousness Alert  Orientation Level Oriented X4  Glasgow Coma Scale  Eye Opening 4  Best Verbal Response (NON-intubated) 5  Best Motor Response 6  Glasgow Coma Scale Score 15  Musculoskeletal   Musculoskeletal (WDL) WDL  Musculoskeletal Details  RLE Full movement  LLE Full movement

## 2022-05-04 ENCOUNTER — Inpatient Hospital Stay: Payer: 59 | Attending: Hematology

## 2022-05-04 ENCOUNTER — Emergency Department (HOSPITAL_COMMUNITY): Payer: 59

## 2022-05-04 ENCOUNTER — Telehealth: Payer: Self-pay | Admitting: Cardiology

## 2022-05-04 ENCOUNTER — Inpatient Hospital Stay: Payer: 59 | Admitting: Physician Assistant

## 2022-05-04 ENCOUNTER — Encounter (HOSPITAL_COMMUNITY): Payer: Self-pay

## 2022-05-04 ENCOUNTER — Other Ambulatory Visit: Payer: Self-pay

## 2022-05-04 ENCOUNTER — Encounter (HOSPITAL_COMMUNITY): Payer: Self-pay | Admitting: Hematology

## 2022-05-04 ENCOUNTER — Emergency Department (HOSPITAL_COMMUNITY)
Admission: EM | Admit: 2022-05-04 | Discharge: 2022-05-04 | Disposition: A | Discharge status: Home / Self Care | Payer: 59 | Attending: Emergency Medicine | Admitting: Emergency Medicine

## 2022-05-04 ENCOUNTER — Encounter: Payer: Self-pay | Admitting: General Practice

## 2022-05-04 ENCOUNTER — Emergency Department (HOSPITAL_COMMUNITY)
Admission: EM | Admit: 2022-05-04 | Discharge: 2022-05-04 | Disposition: A | Discharge status: Home / Self Care | Payer: 59 | Source: Home / Self Care | Attending: Emergency Medicine | Admitting: Emergency Medicine

## 2022-05-04 DIAGNOSIS — I509 Heart failure, unspecified: Secondary | ICD-10-CM | POA: Insufficient documentation

## 2022-05-04 DIAGNOSIS — I129 Hypertensive chronic kidney disease with stage 1 through stage 4 chronic kidney disease, or unspecified chronic kidney disease: Secondary | ICD-10-CM | POA: Insufficient documentation

## 2022-05-04 DIAGNOSIS — E559 Vitamin D deficiency, unspecified: Secondary | ICD-10-CM | POA: Insufficient documentation

## 2022-05-04 DIAGNOSIS — I10 Essential (primary) hypertension: Secondary | ICD-10-CM

## 2022-05-04 DIAGNOSIS — J449 Chronic obstructive pulmonary disease, unspecified: Secondary | ICD-10-CM | POA: Insufficient documentation

## 2022-05-04 DIAGNOSIS — R0602 Shortness of breath: Secondary | ICD-10-CM

## 2022-05-04 DIAGNOSIS — Z7902 Long term (current) use of antithrombotics/antiplatelets: Secondary | ICD-10-CM | POA: Insufficient documentation

## 2022-05-04 DIAGNOSIS — Z923 Personal history of irradiation: Secondary | ICD-10-CM | POA: Insufficient documentation

## 2022-05-04 DIAGNOSIS — F17200 Nicotine dependence, unspecified, uncomplicated: Secondary | ICD-10-CM | POA: Insufficient documentation

## 2022-05-04 DIAGNOSIS — Z7951 Long term (current) use of inhaled steroids: Secondary | ICD-10-CM | POA: Insufficient documentation

## 2022-05-04 DIAGNOSIS — I499 Cardiac arrhythmia, unspecified: Secondary | ICD-10-CM | POA: Diagnosis not present

## 2022-05-04 DIAGNOSIS — I2510 Atherosclerotic heart disease of native coronary artery without angina pectoris: Secondary | ICD-10-CM | POA: Diagnosis not present

## 2022-05-04 DIAGNOSIS — R069 Unspecified abnormalities of breathing: Secondary | ICD-10-CM | POA: Diagnosis not present

## 2022-05-04 DIAGNOSIS — N183 Chronic kidney disease, stage 3 unspecified: Secondary | ICD-10-CM | POA: Insufficient documentation

## 2022-05-04 DIAGNOSIS — R6889 Other general symptoms and signs: Secondary | ICD-10-CM | POA: Diagnosis not present

## 2022-05-04 DIAGNOSIS — N1832 Chronic kidney disease, stage 3b: Secondary | ICD-10-CM | POA: Insufficient documentation

## 2022-05-04 DIAGNOSIS — Z8709 Personal history of other diseases of the respiratory system: Secondary | ICD-10-CM

## 2022-05-04 DIAGNOSIS — I130 Hypertensive heart and chronic kidney disease with heart failure and stage 1 through stage 4 chronic kidney disease, or unspecified chronic kidney disease: Secondary | ICD-10-CM | POA: Insufficient documentation

## 2022-05-04 DIAGNOSIS — R0689 Other abnormalities of breathing: Secondary | ICD-10-CM | POA: Diagnosis not present

## 2022-05-04 DIAGNOSIS — W19XXXA Unspecified fall, initial encounter: Secondary | ICD-10-CM | POA: Diagnosis not present

## 2022-05-04 DIAGNOSIS — Z79899 Other long term (current) drug therapy: Secondary | ICD-10-CM | POA: Insufficient documentation

## 2022-05-04 DIAGNOSIS — D631 Anemia in chronic kidney disease: Secondary | ICD-10-CM | POA: Insufficient documentation

## 2022-05-04 DIAGNOSIS — R55 Syncope and collapse: Secondary | ICD-10-CM

## 2022-05-04 DIAGNOSIS — C44311 Basal cell carcinoma of skin of nose: Secondary | ICD-10-CM

## 2022-05-04 DIAGNOSIS — Z8589 Personal history of malignant neoplasm of other organs and systems: Secondary | ICD-10-CM | POA: Insufficient documentation

## 2022-05-04 DIAGNOSIS — Z85828 Personal history of other malignant neoplasm of skin: Secondary | ICD-10-CM | POA: Insufficient documentation

## 2022-05-04 DIAGNOSIS — D509 Iron deficiency anemia, unspecified: Secondary | ICD-10-CM | POA: Insufficient documentation

## 2022-05-04 DIAGNOSIS — Z743 Need for continuous supervision: Secondary | ICD-10-CM | POA: Diagnosis not present

## 2022-05-04 DIAGNOSIS — Z87891 Personal history of nicotine dependence: Secondary | ICD-10-CM | POA: Insufficient documentation

## 2022-05-04 DIAGNOSIS — D500 Iron deficiency anemia secondary to blood loss (chronic): Secondary | ICD-10-CM

## 2022-05-04 DIAGNOSIS — E876 Hypokalemia: Secondary | ICD-10-CM

## 2022-05-04 DIAGNOSIS — I6381 Other cerebral infarction due to occlusion or stenosis of small artery: Secondary | ICD-10-CM | POA: Diagnosis not present

## 2022-05-04 DIAGNOSIS — Z7952 Long term (current) use of systemic steroids: Secondary | ICD-10-CM | POA: Insufficient documentation

## 2022-05-04 DIAGNOSIS — N189 Chronic kidney disease, unspecified: Secondary | ICD-10-CM | POA: Diagnosis not present

## 2022-05-04 LAB — CBC
HCT: 40.5 % (ref 39.0–52.0)
Hemoglobin: 12.9 g/dL — ABNORMAL LOW (ref 13.0–17.0)
MCH: 28.3 pg (ref 26.0–34.0)
MCHC: 31.9 g/dL (ref 30.0–36.0)
MCV: 88.8 fL (ref 80.0–100.0)
Platelets: 253 10*3/uL (ref 150–400)
RBC: 4.56 MIL/uL (ref 4.22–5.81)
RDW: 14.8 % (ref 11.5–15.5)
WBC: 10.8 10*3/uL — ABNORMAL HIGH (ref 4.0–10.5)
nRBC: 0 % (ref 0.0–0.2)

## 2022-05-04 LAB — CBC WITH DIFFERENTIAL/PLATELET
Abs Immature Granulocytes: 0.03 10*3/uL (ref 0.00–0.07)
Abs Immature Granulocytes: 0.08 10*3/uL — ABNORMAL HIGH (ref 0.00–0.07)
Basophils Absolute: 0 10*3/uL (ref 0.0–0.1)
Basophils Absolute: 0 10*3/uL (ref 0.0–0.1)
Basophils Relative: 0 %
Basophils Relative: 0 %
Eosinophils Absolute: 0 10*3/uL (ref 0.0–0.5)
Eosinophils Absolute: 0 10*3/uL (ref 0.0–0.5)
Eosinophils Relative: 0 %
Eosinophils Relative: 0 %
HCT: 40.8 % (ref 39.0–52.0)
HCT: 42.3 % (ref 39.0–52.0)
Hemoglobin: 13.2 g/dL (ref 13.0–17.0)
Hemoglobin: 13.5 g/dL (ref 13.0–17.0)
Immature Granulocytes: 0 %
Immature Granulocytes: 1 %
Lymphocytes Relative: 11 %
Lymphocytes Relative: 20 %
Lymphs Abs: 1 10*3/uL (ref 0.7–4.0)
Lymphs Abs: 2.2 10*3/uL (ref 0.7–4.0)
MCH: 28.3 pg (ref 26.0–34.0)
MCH: 28.2 pg (ref 26.0–34.0)
MCHC: 32.4 g/dL (ref 30.0–36.0)
MCHC: 31.9 g/dL (ref 30.0–36.0)
MCV: 87.4 fL (ref 80.0–100.0)
MCV: 88.5 fL (ref 80.0–100.0)
Monocytes Absolute: 0.5 10*3/uL (ref 0.1–1.0)
Monocytes Absolute: 1 10*3/uL (ref 0.1–1.0)
Monocytes Relative: 5 %
Monocytes Relative: 9 %
Neutro Abs: 8.1 10*3/uL — ABNORMAL HIGH (ref 1.7–7.7)
Neutro Abs: 7.9 10*3/uL — ABNORMAL HIGH (ref 1.7–7.7)
Neutrophils Relative %: 84 %
Neutrophils Relative %: 70 %
Platelets: 272 10*3/uL (ref 150–400)
Platelets: 296 10*3/uL (ref 150–400)
RBC: 4.67 MIL/uL (ref 4.22–5.81)
RBC: 4.78 MIL/uL (ref 4.22–5.81)
RDW: 14.7 % (ref 11.5–15.5)
RDW: 14.7 % (ref 11.5–15.5)
WBC: 9.6 10*3/uL (ref 4.0–10.5)
WBC: 11.2 10*3/uL — ABNORMAL HIGH (ref 4.0–10.5)
nRBC: 0 % (ref 0.0–0.2)
nRBC: 0 % (ref 0.0–0.2)

## 2022-05-04 LAB — BASIC METABOLIC PANEL
Anion gap: 10 (ref 5–15)
Anion gap: 8 (ref 5–15)
BUN: 42 mg/dL — ABNORMAL HIGH (ref 8–23)
BUN: 43 mg/dL — ABNORMAL HIGH (ref 8–23)
CO2: 20 mmol/L — ABNORMAL LOW (ref 22–32)
CO2: 24 mmol/L (ref 22–32)
Calcium: 8.6 mg/dL — ABNORMAL LOW (ref 8.9–10.3)
Calcium: 8.4 mg/dL — ABNORMAL LOW (ref 8.9–10.3)
Chloride: 105 mmol/L (ref 98–111)
Chloride: 105 mmol/L (ref 98–111)
Creatinine, Ser: 1.93 mg/dL — ABNORMAL HIGH (ref 0.61–1.24)
Creatinine, Ser: 1.76 mg/dL — ABNORMAL HIGH (ref 0.61–1.24)
GFR, Estimated: 37 mL/min — ABNORMAL LOW (ref >60)
GFR, Estimated: 42 mL/min — ABNORMAL LOW (ref >60)
Glucose, Bld: 304 mg/dL — ABNORMAL HIGH (ref 70–99)
Glucose, Bld: 97 mg/dL (ref 70–99)
Potassium: 3.8 mmol/L (ref 3.5–5.1)
Potassium: 3.4 mmol/L — ABNORMAL LOW (ref 3.5–5.1)
Sodium: 135 mmol/L (ref 135–145)
Sodium: 137 mmol/L (ref 135–145)

## 2022-05-04 LAB — IRON AND TIBC
Iron: 32 ug/dL — ABNORMAL LOW (ref 45–182)
Saturation Ratios: 8 % — ABNORMAL LOW (ref 17.9–39.5)
TIBC: 388 ug/dL (ref 250–450)
UIBC: 356 ug/dL

## 2022-05-04 LAB — FERRITIN: Ferritin: 37 ng/mL (ref 24–336)

## 2022-05-04 LAB — TROPONIN I (HIGH SENSITIVITY): Troponin I (High Sensitivity): 42 ng/L — ABNORMAL HIGH (ref <18)

## 2022-05-04 MED ORDER — POTASSIUM CHLORIDE CRYS ER 20 MEQ PO TBCR
40.0000 meq | EXTENDED_RELEASE_TABLET | Freq: Once | ORAL | Status: AC
  Administered 2022-05-04: 40 meq via ORAL
  Filled 2022-05-04: qty 2

## 2022-05-04 MED ORDER — ALBUTEROL SULFATE (2.5 MG/3ML) 0.083% IN NEBU
5.0000 mg | INHALATION_SOLUTION | Freq: Once | RESPIRATORY_TRACT | Status: AC
  Administered 2022-05-04: 5 mg via RESPIRATORY_TRACT
  Filled 2022-05-04: qty 6

## 2022-05-04 MED ORDER — IPRATROPIUM BROMIDE 0.02 % IN SOLN
0.5000 mg | Freq: Once | RESPIRATORY_TRACT | Status: AC
  Administered 2022-05-04: 0.5 mg via RESPIRATORY_TRACT
  Filled 2022-05-04: qty 2.5

## 2022-05-04 MED ORDER — POTASSIUM CHLORIDE CRYS ER 20 MEQ PO TBCR: 20.0000 meq | EXTENDED_RELEASE_TABLET | Freq: Every day | ORAL | 0 refills | Status: DC

## 2022-05-04 MED ORDER — CARVEDILOL 3.125 MG PO TABS
6.2500 mg | ORAL_TABLET | Freq: Once | ORAL | Status: AC
  Administered 2022-05-04: 6.25 mg via ORAL
  Filled 2022-05-04: qty 2

## 2022-05-04 MED ORDER — ALBUTEROL SULFATE HFA 108 (90 BASE) MCG/ACT IN AERS
2.0000 | INHALATION_SPRAY | Freq: Once | RESPIRATORY_TRACT | Status: AC
  Administered 2022-05-04: 2 via RESPIRATORY_TRACT
  Filled 2022-05-04: qty 6.7

## 2022-05-04 NOTE — ED Triage Notes (Signed)
EMS states pt was just seen at [facility] & discharged.  Pt complains of SOB.  Denies CP, states he does have a cough but he quit smoking 6 weeks ago, pt has cigarettes in his front shirt pocket.

## 2022-05-04 NOTE — Telephone Encounter (Signed)
Patient notified and verbalized understanding. Patient stated he will continue to check bp at home and bring readings in to his f/u with provider on 4/12. Pt had no further questions or concerns at this time.

## 2022-05-04 NOTE — ED Provider Notes (Signed)
[facility] Provider Note   CSN: [id number] Arrival date & time: 05/04/22  1043     History  Chief Complaint  Patient presents with   Shortness of Breath    Michael [name] is a 67 y.o. male.  Pt indicates had fall this AM walking home, unsure if hit head, is on plavix. Denies chest pain or discomfort. No palpitations. Notes hx syncope, pt not sure of cause, recent admission for same. Also notes mild sob/wheezing, +smoker. Denies new or worsening cough. No fever or chills. Denies abd pain or nvd. Denies blood loss, rectal bleeding or melena. No dysuria or gu c/o. Denies neck or back pain, denies extremity pain or injury.   The history is provided by the patient, medical records and the EMS personnel.  Shortness of Breath Associated symptoms: wheezing   Associated symptoms: no abdominal pain, no chest pain, no fever, no headaches, no neck pain, no rash, no sore throat and no vomiting        Home Medications Prior to Admission medications   Medication Sig Start Date End Date Taking? Authorizing Provider  acetaminophen (TYLENOL) 650 MG CR tablet Take 1,300 mg by mouth as needed for pain.   Yes [provider]  albuterol (VENTOLIN HFA) 108 (90 Base) MCG/ACT inhaler Inhale 2 puffs into the lungs every 6 (six) hours as needed for wheezing or shortness of breath. 04/27/22  Yes Shahmehdi, Seyed A, MD  atorvastatin (LIPITOR) 80 MG tablet Take 1 tablet (80 mg total) by mouth every evening. 04/27/22  Yes Shahmehdi, Valeria Batman, MD  Budeson-Glycopyrrol-Formoterol (BREZTRI AEROSPHERE) 160-9-4.8 MCG/ACT AERO Inhale 2 puffs into the lungs 2 (two) times daily.   Yes [provider]  carvedilol (COREG) 6.25 MG tablet Take 1 tablet (6.25 mg total) by mouth daily. 05/02/22  Yes Little Ishikawa, MD  clopidogrel (PLAVIX) 75 MG tablet Take 1 tablet (75 mg total) by mouth daily. 04/27/22  Yes Shahmehdi, Seyed A, MD  ibuprofen (ADVIL) 200 MG tablet  Take 800 mg by mouth as needed for moderate pain.   Yes [provider]  predniSONE (DELTASONE) 10 MG tablet Take 4 tablets (40 mg total) by mouth daily for 3 days, THEN 3 tablets (30 mg total) daily for 3 days, THEN 2 tablets (20 mg total) daily for 3 days, THEN 1 tablet (10 mg total) daily for 3 days. 05/01/22 05/13/22 Yes Little Ishikawa, MD  isosorbide mononitrate (IMDUR) 60 MG 24 hr tablet Take 1 tablet (60 mg total) by mouth every evening. 04/27/22   Deatra James, MD      Allergies    Patient has no known allergies.    Review of Systems   Review of Systems  Constitutional:  Negative for chills and fever.  HENT:  Negative for sore throat.   Eyes:  Negative for redness and visual disturbance.  Respiratory:  Positive for shortness of breath and wheezing.   Cardiovascular:  Negative for chest pain, palpitations and leg swelling.  Gastrointestinal:  Negative for abdominal pain, blood in stool, diarrhea and vomiting.  Genitourinary:  Negative for dysuria and flank pain.  Musculoskeletal:  Negative for back pain and neck pain.  Skin:  Negative for rash.  Neurological:  Negative for weakness, numbness and headaches.  Psychiatric/Behavioral:  Negative for confusion.     Physical Exam Updated Vital Signs BP (!) 186/100   Pulse 70   Resp 14   Ht 1.829 m (6')   Wt 66  kg   SpO2 100%   BMI 19.73 kg/m  Physical Exam Vitals and nursing note reviewed.  Constitutional:      Appearance: Normal appearance. He is well-developed.  HENT:     Head: Atraumatic.     Nose: Nose normal.     Mouth/Throat:     Mouth: Mucous membranes are moist.     Pharynx: Oropharynx is clear.  Eyes:     General: No scleral icterus.    Conjunctiva/sclera: Conjunctivae normal.     Pupils: Pupils are equal, round, and reactive to light.  Neck:     Vascular: No carotid bruit.     Trachea: No tracheal deviation.  Cardiovascular:     Rate and Rhythm: Normal rate and regular rhythm.      Pulses: Normal pulses.     Heart sounds: Normal heart sounds. No murmur heard.    No friction rub. No gallop.  Pulmonary:     Effort: Pulmonary effort is normal. No accessory muscle usage or respiratory distress.     Breath sounds: Normal breath sounds.     Comments: Mild wheezing.  Chest:     Chest wall: No tenderness.  Abdominal:     General: Bowel sounds are normal. There is no distension.     Palpations: Abdomen is soft.     Tenderness: There is no abdominal tenderness. There is no guarding.  Genitourinary:    Comments: No cva tenderness. Musculoskeletal:        General: No swelling.     Cervical back: Normal range of motion and neck supple. No rigidity or tenderness.     Comments: CTLS spine, non tender, aligned, no step off. Good rom bil extremities without pain or focal bony tenderness.   Skin:    General: Skin is warm and dry.     Findings: No rash.  Neurological:     Mental Status: He is alert.     Comments: Alert, speech clear. GCs 15. Motor/sens grossly intact bil.   Psychiatric:        Mood and Affect: Mood normal.     ED Results / Procedures / Treatments   Labs (all labs ordered are listed, but only abnormal results are displayed) Results for orders placed or performed during the hospital encounter of 05/04/22  CBC  Result Value Ref Range   WBC 10.8 (H) 4.0 - 10.5 K/uL   RBC 4.56 4.22 - 5.81 MIL/uL   Hemoglobin 12.9 (L) 13.0 - 17.0 g/dL   HCT 40.5 39.0 - 52.0 %   MCV 88.8 80.0 - 100.0 fL   MCH 28.3 26.0 - 34.0 pg   MCHC 31.9 30.0 - 36.0 g/dL   RDW 14.8 11.5 - 15.5 %   Platelets 253 150 - 400 K/uL   nRBC 0.0 0.0 - 0.2 %  Basic metabolic panel  Result Value Ref Range   Sodium 137 135 - 145 mmol/L   Potassium 3.4 (L) 3.5 - 5.1 mmol/L   Chloride 105 98 - 111 mmol/L   CO2 24 22 - 32 mmol/L   Glucose, Bld 97 70 - 99 mg/dL   BUN 43 (H) 8 - 23 mg/dL   Creatinine, Ser 1.76 (H) 0.61 - 1.24 mg/dL   Calcium 8.4 (L) 8.9 - 10.3 mg/dL   GFR, Estimated 42 (L) >60  mL/min   Anion gap 8 5 - 15   CT Head Wo Contrast  Result Date: 05/04/2022 CLINICAL DATA:  Head trauma, minor (Age >= 65y) EXAM: CT HEAD  WITHOUT CONTRAST TECHNIQUE: Contiguous axial images were obtained from the base of the skull through the vertex without intravenous contrast. RADIATION DOSE REDUCTION: This exam was performed according to the departmental dose-optimization program which includes automated exposure control, adjustment of the mA and/or kV according to patient size and/or use of iterative reconstruction technique. COMPARISON:  04/29/2022 FINDINGS: Brain: There is atrophy and chronic small vessel disease changes. Old left basal ganglia lacunar infarct. No acute intracranial abnormality. Specifically, no hemorrhage, hydrocephalus, mass lesion, acute infarction, or significant intracranial injury. Vascular: No hyperdense vessel or unexpected calcification. Skull: No acute calvarial abnormality. Sinuses/Orbits: No acute findings Other: None IMPRESSION: Old left basal ganglia lacunar infarct. Atrophy, chronic microvascular disease. No acute intracranial abnormality. Electronically Signed   By: Rolm Baptise M.D.   On: 05/04/2022 12:59   DG Chest Port 1 View  Result Date: 05/04/2022 CLINICAL DATA:  Shortness of breath and. EXAM: PORTABLE CHEST 1 VIEW COMPARISON:  Portable exam 1205 hours compared to 05/04/2022 FINDINGS: Upper normal size of cardiac silhouette. Atherosclerotic calcification aorta. Mediastinal contours and pulmonary vascularity normal. Lungs clear. No acute infiltrate, pleural effusion, or pneumothorax. Osseous demineralization. IMPRESSION: No acute abnormalities. Aortic Atherosclerosis (ICD10-I70.0). Electronically Signed   By: Lavonia Dana M.D.   On: 05/04/2022 12:13   DG Chest Portable 1 View  Result Date: 05/04/2022 CLINICAL DATA:  Shortness of breath EXAM: PORTABLE CHEST 1 VIEW COMPARISON:  Radiographs 04/29/2022 FINDINGS: Stable cardiomediastinal silhouette. Aortic  atherosclerotic calcification. Hyperinflation and chronic bronchitic change. No focal consolidation, pleural effusion, or pneumothorax. No acute osseous abnormality. IMPRESSION: 1. No active disease. 2. COPD. Electronically Signed   By: Placido Sou M.D.   On: 05/04/2022 00:55   ECHOCARDIOGRAM COMPLETE  Result Date: 04/30/2022    ECHOCARDIOGRAM REPORT   [name] Date of Exam: 04/30/2022 Medical Rec #:  [id number]     Height:       72.0 in Accession #:    [id number]    Weight:       145.5 lb Date of Birth:  [birth year]     BSA:          1.861 m Patient Age:    34 years      BP:           121/64 mmHg Patient Gender: M             HR:           64 bpm. Exam Location:  Inpatient Procedure: 2D Echo, Cardiac Doppler and Color Doppler Indications:    CHF  History:        Patient has prior history of Echocardiogram examinations, most                 recent 10/21/2021. CAD, COPD, Signs/Symptoms:Syncope; Risk                 Factors:Hypertension and Current Smoker.  Sonographer:    Marella Chimes Referring Phys: Graham  1. Left ventricular ejection fraction, by estimation, is 35 to 40%. The left ventricle has moderately decreased function. The left ventricle demonstrates global hypokinesis. Left ventricular diastolic parameters are indeterminate.  2. Right ventricular systolic function is normal. The right ventricular size is normal. There is normal pulmonary artery systolic pressure.  3. The mitral valve is normal in structure. No evidence of mitral valve regurgitation. No evidence of mitral stenosis.  4. The aortic valve has an indeterminant number of cusps. There is mild calcification  of the aortic valve. There is mild thickening of the aortic valve. Aortic valve regurgitation is not visualized. No aortic stenosis is present.  5. The inferior vena cava is normal in size with greater than 50% respiratory variability, suggesting right atrial pressure of 3 mmHg. FINDINGS  Left  Ventricle: Left ventricular ejection fraction, by estimation, is 35 to 40%. The left ventricle has moderately decreased function. The left ventricle demonstrates global hypokinesis. The left ventricular internal cavity size was normal in size. There is no left ventricular hypertrophy. Left ventricular diastolic parameters are indeterminate. Right Ventricle: The right ventricular size is normal. Right vetricular wall thickness was not well visualized. Right ventricular systolic function is normal. There is normal pulmonary artery systolic pressure. The tricuspid regurgitant velocity is 2.48 m/s, and with an assumed right atrial pressure of 3 mmHg, the estimated right ventricular systolic pressure is 0000000 mmHg. Left Atrium: Left atrial size was normal in size. Right Atrium: Right atrial size was normal in size. Pericardium: There is no evidence of pericardial effusion. Mitral Valve: The mitral valve is normal in structure. No evidence of mitral valve regurgitation. No evidence of mitral valve stenosis. Tricuspid Valve: The tricuspid valve is normal in structure. Tricuspid valve regurgitation is mild . No evidence of tricuspid stenosis. Aortic Valve: The aortic valve has an indeterminant number of cusps. There is mild calcification of the aortic valve. There is mild thickening of the aortic valve. There is mild aortic valve annular calcification. Aortic valve regurgitation is not visualized. No aortic stenosis is present. Aortic valve mean gradient measures 4.0 mmHg. Aortic valve peak gradient measures 7.6 mmHg. Aortic valve area, by VTI measures 2.94 cm. Pulmonic Valve: The pulmonic valve was not well visualized. Pulmonic valve regurgitation is not visualized. No evidence of pulmonic stenosis. Aorta: The aortic root is normal in size and structure. Venous: The inferior vena cava is normal in size with greater than 50% respiratory variability, suggesting right atrial pressure of 3 mmHg. IAS/Shunts: No atrial level  shunt detected by color flow Doppler.  LEFT VENTRICLE PLAX 2D LVIDd:         5.60 cm      Diastology LVIDs:         5.35 cm      LV e' medial:    3.48 cm/s LV PW:         1.10 cm      LV E/e' medial:  27.1 LV IVS:        1.05 cm      LV e' lateral:   9.25 cm/s LVOT diam:     2.20 cm      LV E/e' lateral: 10.2 LV SV:         93 LV SV Index:   50 LVOT Area:     3.80 cm  LV Volumes (MOD) LV vol d, MOD A4C: 149.0 ml LV vol s, MOD A4C: 116.0 ml LV SV MOD A4C:     149.0 ml RIGHT VENTRICLE RV S prime:     7.72 cm/s TAPSE (M-mode): 2.7 cm LEFT ATRIUM           Index        RIGHT ATRIUM           Index LA Vol (A2C): 66.3 ml 35.62 ml/m  RA Area:     20.20 cm LA Vol (A4C): 53.4 ml 28.69 ml/m  RA Volume:   55.50 ml  29.82 ml/m  AORTIC VALVE AV Area (Vmax):    3.25  cm AV Area (Vmean):   2.88 cm AV Area (VTI):     2.94 cm AV Vmax:           138.00 cm/s AV Vmean:          93.300 cm/s AV VTI:            0.317 m AV Peak Grad:      7.6 mmHg AV Mean Grad:      4.0 mmHg LVOT Vmax:         118.00 cm/s LVOT Vmean:        70.600 cm/s LVOT VTI:          0.245 m LVOT/AV VTI ratio: 0.77  AORTA Ao Root diam: 3.30 cm Ao Asc diam:  3.40 cm MITRAL VALVE               TRICUSPID VALVE MV Area (PHT): 2.10 cm    TR Peak grad:   24.6 mmHg MV Decel Time: 361 msec    TR Vmax:        248.00 cm/s MV E velocity: 94.30 cm/s MV A velocity: 94.30 cm/s  SHUNTS MV E/A ratio:  1.00        Systemic VTI:  0.24 m                            Systemic Diam: 2.20 cm Carlyle Dolly MD Electronically signed by Carlyle Dolly MD Signature Date/Time: 04/30/2022/10:09:53 AM    Final    CT Head Wo Contrast  Result Date: 04/29/2022 CLINICAL DATA:  Trauma EXAM: CT HEAD WITHOUT CONTRAST TECHNIQUE: Contiguous axial images were obtained from the base of the skull through the vertex without intravenous contrast. RADIATION DOSE REDUCTION: This exam was performed according to the departmental dose-optimization program which includes automated exposure control,  adjustment of the mA and/or kV according to patient size and/or use of iterative reconstruction technique. COMPARISON:  CT Head 12/17/19 FINDINGS: Brain: No evidence of acute infarction, hemorrhage, hydrocephalus, extra-axial collection or mass lesion/mass effect. There is sequela of severe chronic microvascular ischemic change. Chronic infarcts in the left basal ganglia. Vascular: No hyperdense vessel or unexpected calcification. Skull: Postsurgical changes prior complex maxillofacial reconstruction. Sinuses/Orbits: Middle ear or mastoid. Paranasal sinuses are clear. There postsurgical changes from a prior orbital floor fracture repair. Orbits are otherwise unremarkable. Other: Likely an epidermal inclusion cyst along the left parietal scalp. IMPRESSION: 1. No acute intracranial abnormality. 2. Sequela of severe chronic microvascular ischemic change. Electronically Signed   By: Marin Roberts M.D.   On: 04/29/2022 13:22   DG Chest 2 View  Result Date: 04/29/2022 CLINICAL DATA:  Shortness of breath. EXAM: CHEST - 2 VIEW COMPARISON:  April 28, 2022. FINDINGS: Stable cardiomediastinal silhouette. Hyperexpansion of the lungs is noted. No acute pulmonary disease is noted. Bony thorax is unremarkable. IMPRESSION: Hyperexpansion of the lungs. No acute cardiopulmonary abnormality seen. Aortic Atherosclerosis (ICD10-I70.0). Electronically Signed   By: Marijo Conception M.D.   On: 04/29/2022 12:26   DG Chest 2 View  Result Date: 04/28/2022 CLINICAL DATA:  cough, SOB EXAM: CHEST - 2 VIEW COMPARISON:  Chest x-ray 04/28/2022, CT chest 04/25/2022 FINDINGS: The heart and mediastinal contours are unchanged. Aortic calcification. Chronic right Base subcentimeter calcification likely granuloma. No focal consolidation. No pulmonary edema. No pleural effusion. No pneumothorax. No acute osseous abnormality. IMPRESSION: 1. No active cardiopulmonary disease. 2.  Aortic Atherosclerosis (ICD10-I70.0). Electronically Signed   By:  Iven Finn  M.D.   On: 04/28/2022 23:23   DG Chest Port 1 View  Result Date: 04/28/2022 CLINICAL DATA:  Shortness of breath. EXAM: PORTABLE CHEST 1 VIEW COMPARISON:  04/25/2018 for FINDINGS: The cardiac silhouette, mediastinal and contours are within normal limits given the AP projection and portable technique. Stable underlying emphysematous changes and pulmonary scarring. No acute overlying pulmonary process. No pleural effusion or pneumothorax. IMPRESSION: Emphysematous changes and pulmonary scarring but no acute overlying pulmonary process. Electronically Signed   By: Marijo Sanes M.D.   On: 04/28/2022 14:58   CT Angio Chest PE W and/or Wo Contrast  Result Date: 04/25/2022 CLINICAL DATA:  Shortness of breath EXAM: CT ANGIOGRAPHY CHEST WITH CONTRAST TECHNIQUE: Multidetector CT imaging of the chest was performed using the standard protocol during bolus administration of intravenous contrast. Multiplanar CT image reconstructions and MIPs were obtained to evaluate the vascular anatomy. RADIATION DOSE REDUCTION: This exam was performed according to the departmental dose-optimization program which includes automated exposure control, adjustment of the mA and/or kV according to patient size and/or use of iterative reconstruction technique. CONTRAST:  40mL OMNIPAQUE IOHEXOL 350 MG/ML SOLN COMPARISON:  CT angiogram chest 11/13/2021.  CT abdomen 01/25/2022. FINDINGS: Cardiovascular: Heart is mildly enlarged. Aorta is normal in size. There is no pericardial effusion. There are atherosclerotic calcifications of the aorta. There is adequate opacification of the pulmonary arteries to the segmental level. There is no evidence for pulmonary embolism. Mediastinum/Nodes: There is a heterogeneous left thyroid nodule measuring up to 2.5 cm similar to prior. There are no enlarged mediastinal or hilar lymph nodes. Esophagus is nondilated. Lungs/Pleura: Mild emphysematous changes are present. There stable nodular  densities in the right lower lobe measuring up to 3 mm. There is a new rounded 4 mm nodule in the superior segment of the left lower lobe image 6/43. The lungs are otherwise clear. There is no pleural effusion or pneumothorax. Upper Abdomen: Stable hypodense lesions in the liver. Stable pancreatic calcifications. Musculoskeletal: No chest wall abnormality. No acute or significant osseous findings. Review of the MIP images confirms the above findings. IMPRESSION: 1. No evidence for pulmonary embolism. 2. Mild cardiomegaly. 3. New 4 mm left solid pulmonary nodule. Per Fleischner Society Guidelines, no routine follow-up imaging is recommended. These guidelines do not apply to immunocompromised patients and patients with cancer. Follow up in patients with significant comorbidities as clinically warranted. For lung cancer screening, adhere to Lung-RADS guidelines. Reference: Radiology. 2017; 284(1):228-43. 4. Stable right lower lobe pulmonary nodules measuring up to 3 mm. Aortic Atherosclerosis (ICD10-I70.0). Electronically Signed   By: Ronney Asters M.D.   On: 04/25/2022 23:35   DG Chest Port 1 View  Result Date: 04/25/2022 CLINICAL DATA:  Shortness of breath. EXAM: PORTABLE CHEST 1 VIEW COMPARISON:  04/13/2022. FINDINGS: The heart is enlarged and the mediastinal contour is within normal limits. There is atherosclerotic calcification of the aorta. Hyperinflation of the lungs is noted. There is chronic prominent interstitial thickening. No consolidation, effusion, or pneumothorax. No acute osseous abnormality. IMPRESSION: 1. Chronic interstitial prominence and hyperinflation of the lungs with no acute abnormality. 2. Cardiomegaly. Electronically Signed   By: Brett Fairy M.D.   On: 04/25/2022 22:18   MR BRAIN WO CONTRAST  Result Date: 04/13/2022 CLINICAL DATA:  Stroke suspected EXAM: MRI HEAD WITHOUT CONTRAST TECHNIQUE: Multiplanar, multiecho pulse sequences of the brain and surrounding structures were obtained  without intravenous contrast. COMPARISON:  MRI Head 06/04/17 FINDINGS: Brain: Negative for an acute infarct. Sequela of severe chronic microvascular ischemic change near  confluence T2/FLAIR hyperintense periventricular signal abnormality. There are few small microhemorrhages in the bilateral thalami and cerebellar hemispheres, favored to be hypertensive in nature. No extra-axial fluid collection. Chronic infarct in the left basal ganglia. And bilateral corona radiata Vascular: Normal flow voids. Skull and upper cervical spine: There is a 1.5 cm cystic lesion in the subcutaneous soft tissues over the left occipital calvarium, favored to represent a small epidermal inclusion cyst. Postsurgical changes of the right orbital floor. Sinuses/Orbits: No middle ear or mastoid effusion. Mucosal thickening left maxillary sinus and bilateral ethmoid sinuses. Other: None IMPRESSION: 1. No acute intracranial process. 2. Sequela of severe chronic microvascular ischemic change. Electronically Signed   By: Marin Roberts M.D.   On: 04/13/2022 15:25   DG Chest 1 View  Result Date: 04/13/2022 CLINICAL DATA:  near-syncope EXAM: CHEST  1 VIEW COMPARISON:  Chest x-ray 02/24/2022. FINDINGS: Chronic mild prominence of the lung markings. No consolidation. No visible pleural effusions or pneumothorax. Cardiomediastinal silhouette is unchanged. Polyarticular degenerative change. Osteopenia. No acute displaced fracture identified. IMPRESSION: No evidence of acute cardiopulmonary disease. Electronically Signed   By: Margaretha Sheffield M.D.   On: 04/13/2022 13:41    ED ECG REPORT   Date: 05/04/2022  Rate: 66  Rhythm: normal sinus rhythm and premature ventricular contractions (PVC)  QRS Axis: normal  Intervals: PR shortened  ST/T Wave abnormalities: nonspecific ST/T changes  Conduction Disutrbances:none and nonspecific intraventricular conduction delay  Narrative Interpretation:   Old EKG Reviewed: unchanged  I have personally  reviewed the EKG tracing    Radiology CT Head Wo Contrast  Result Date: 05/04/2022 CLINICAL DATA:  Head trauma, minor (Age >= 65y) EXAM: CT HEAD WITHOUT CONTRAST TECHNIQUE: Contiguous axial images were obtained from the base of the skull through the vertex without intravenous contrast. RADIATION DOSE REDUCTION: This exam was performed according to the departmental dose-optimization program which includes automated exposure control, adjustment of the mA and/or kV according to patient size and/or use of iterative reconstruction technique. COMPARISON:  04/29/2022 FINDINGS: Brain: There is atrophy and chronic small vessel disease changes. Old left basal ganglia lacunar infarct. No acute intracranial abnormality. Specifically, no hemorrhage, hydrocephalus, mass lesion, acute infarction, or significant intracranial injury. Vascular: No hyperdense vessel or unexpected calcification. Skull: No acute calvarial abnormality. Sinuses/Orbits: No acute findings Other: None IMPRESSION: Old left basal ganglia lacunar infarct. Atrophy, chronic microvascular disease. No acute intracranial abnormality. Electronically Signed   By: Rolm Baptise M.D.   On: 05/04/2022 12:59   DG Chest Port 1 View  Result Date: 05/04/2022 CLINICAL DATA:  Shortness of breath and. EXAM: PORTABLE CHEST 1 VIEW COMPARISON:  Portable exam 1205 hours compared to 05/04/2022 FINDINGS: Upper normal size of cardiac silhouette. Atherosclerotic calcification aorta. Mediastinal contours and pulmonary vascularity normal. Lungs clear. No acute infiltrate, pleural effusion, or pneumothorax. Osseous demineralization. IMPRESSION: No acute abnormalities. Aortic Atherosclerosis (ICD10-I70.0). Electronically Signed   By: Lavonia Dana M.D.   On: 05/04/2022 12:13   DG Chest Portable 1 View  Result Date: 05/04/2022 CLINICAL DATA:  Shortness of breath EXAM: PORTABLE CHEST 1 VIEW COMPARISON:  Radiographs 04/29/2022 FINDINGS: Stable cardiomediastinal silhouette. Aortic  atherosclerotic calcification. Hyperinflation and chronic bronchitic change. No focal consolidation, pleural effusion, or pneumothorax. No acute osseous abnormality. IMPRESSION: 1. No active disease. 2. COPD. Electronically Signed   By: Placido Sou M.D.   On: 05/04/2022 00:55    Procedures Procedures    Medications Ordered in ED Medications  albuterol (PROVENTIL) (2.5 MG/3ML) 0.083% nebulizer solution 5 mg (5  mg Nebulization Given 05/04/22 1213)  ipratropium (ATROVENT) nebulizer solution 0.5 mg (0.5 mg Nebulization Given 05/04/22 1213)    ED Course/ Medical Decision Making/ A&P                             Medical Decision Making Problems Addressed: Essential hypertension: chronic illness or injury with exacerbation, progression, or side effects of treatment that poses a threat to life or bodily functions History of COPD: chronic illness or injury with exacerbation, progression, or side effects of treatment that poses a threat to life or bodily functions Hypokalemia: acute illness or injury SOB (shortness of breath): acute illness or injury Stage 3b chronic kidney disease: chronic illness or injury with exacerbation, progression, or side effects of treatment that poses a threat to life or bodily functions Syncope, unspecified syncope type: acute illness or injury with systemic symptoms that poses a threat to life or bodily functions Uncontrolled hypertension: acute illness or injury  Amount and/or Complexity of Data Reviewed Independent Historian: EMS    Details: hx External Data Reviewed: labs, radiology and notes. Labs: ordered. Decision-making details documented in ED Course. Radiology: ordered and independent interpretation performed. Decision-making details documented in ED Course. ECG/medicine tests: ordered and independent interpretation performed. Decision-making details documented in ED Course.  Risk Prescription drug management. Decision regarding hospitalization.   Iv  ns. Continuous pulse ox and cardiac monitoring. Labs ordered/sent. Imaging ordered.   Differential diagnosis includes syncope, anemia, dehydration, copd, etc. Dispo decision including potential need for admission considered - will get labs and imaging and reassess.   Reviewed nursing notes and prior charts for additional history. External reports reviewed. Additional history from: EMS.   Cardiac monitor: sinus rhythm, rate 70. Albuterol and atrovent neb. Recheck no wheezing, no increased wob or sob.   Labs reviewed/interpreted by me - wvbc 10, hct 40. Ckd c/w  prior. K sl low, kcl po.   Xrays reviewed/interpreted by me - no pna.   CT reviewed/interpreted by me - no hem. Discussed w pt.  Pt indicates has adequate of his bp meds at home - rec pcp f/u one week.   Recheck, no cp. No sob. No faintness.  Po fluids/food. Ambulate in hall.   Pt currently appears stable for d/c.   Rec close outpt pcp/cardiology f/u.  Return precautions provided.              Final Clinical Impression(s) / ED Diagnoses Final diagnoses:  None    Rx / DC Orders ED Discharge Orders     None         Lajean Saver, MD 05/04/22 1345

## 2022-05-04 NOTE — Telephone Encounter (Signed)
Per d/c summary from hospital provider:   Close follow-up with PCP as scheduled, discontinued patient's Entresto and amlodipine as well as decreasing his dose of Coreg given his hypotension which appears to have improved over the past 48 hours.   Please advise.

## 2022-05-04 NOTE — ED Triage Notes (Signed)
Pt arrived via [facility] c/o fall in which he does not remember falling, unsure if he hit his head, Hx of a fib, on blood thinners, per ems, EKG APPEARS TO [place]

## 2022-05-04 NOTE — Discharge Instructions (Addendum)
It was our pleasure to provide your ER care today - we hope that you feel better.  Drink plenty of fluids/stay well hydrated. Avoid any smoking.  From today's labs, your potassium level is slightly low - eat plenty of fruits and vegetables, take potassium supplement as prescribed, and follow up with your doctor in one week.  Also, your blood pressure is high today - continue your meds, follow heart health eating plan, and follow up with your doctor in one week.   For fainting, follow up closely with cardiologist - discuss possible monitoring.   Return to ER if worse, new symptoms, fevers, chest pain, increased trouble breathing, recurrent fainting, or other emergency concern.

## 2022-05-04 NOTE — ED Provider Notes (Signed)
[facility] Provider Note   CSN: [id number] Arrival date & time: 05/04/22  [id number]     History  Chief Complaint  Patient presents with   Shortness of Breath    Michael [name] is a 67 y.o. male.  HPI     This is a 67 year old male who presents with shortness of breath.  He was just discharged from [facility] on yesterday after being admitted for syncope.  At discharge, he had some social issues because he is now homeless.  Discharge was delayed and he was attempting to appeal.  He presents tonight with reported shortness of breath.  States that it came on all of a sudden.  EMS advised nursing that patient was about to be arrested for shoplifting when he complained of shortness of breath.  He denies any recent fevers or cough.  He is a smoker and has a history of COPD.  No leg swelling.  Of note significantly hypertensive.  He was noted to be hypotensive on his last admission and blood pressure medications were adjusted.  I reviewed his chart.  He was noted to be hypotensive and near syncopal on his last hospitalization with orthostasis.  Multiple blood pressure medications were discontinued and he is now only on Coreg 6.25.  It does appear that his blood pressures began to increase while in the hospital.  Upon arrival today his blood pressure is 200/93.  Home Medications Prior to Admission medications   Medication Sig Start Date End Date Taking? Authorizing Provider  acetaminophen (TYLENOL) 650 MG CR tablet Take 1,300 mg by mouth as needed for pain.    [provider]  albuterol (VENTOLIN HFA) 108 (90 Base) MCG/ACT inhaler Inhale 2 puffs into the lungs every 6 (six) hours as needed for wheezing or shortness of breath. 04/27/22   Shahmehdi, Valeria Batman, MD  atorvastatin (LIPITOR) 80 MG tablet Take 1 tablet (80 mg total) by mouth every evening. 04/27/22   Shahmehdi, Valeria Batman, MD  Budeson-Glycopyrrol-Formoterol (BREZTRI AEROSPHERE) 160-9-4.8 MCG/ACT AERO  Inhale 2 puffs into the lungs 2 (two) times daily.    [provider]  carvedilol (COREG) 6.25 MG tablet Take 1 tablet (6.25 mg total) by mouth daily. 05/02/22   Little Ishikawa, MD  clopidogrel (PLAVIX) 75 MG tablet Take 1 tablet (75 mg total) by mouth daily. 04/27/22   Shahmehdi, Valeria Batman, MD  ibuprofen (ADVIL) 200 MG tablet Take 800 mg by mouth as needed for moderate pain.    [provider]  isosorbide mononitrate (IMDUR) 60 MG 24 hr tablet Take 1 tablet (60 mg total) by mouth every evening. 04/27/22   Shahmehdi, Valeria Batman, MD  predniSONE (DELTASONE) 10 MG tablet Take 4 tablets (40 mg total) by mouth daily for 3 days, THEN 3 tablets (30 mg total) daily for 3 days, THEN 2 tablets (20 mg total) daily for 3 days, THEN 1 tablet (10 mg total) daily for 3 days. 05/01/22 05/13/22  Little Ishikawa, MD      Allergies    Patient has no known allergies.    Review of Systems   Review of Systems  Constitutional:  Negative for fever.  Respiratory:  Positive for shortness of breath. Negative for cough.   All other systems reviewed and are negative.   Physical Exam Updated Vital Signs BP (!) 200/93   Pulse 76   Temp (!) 97.5 F (36.4 C) (Oral)   Resp 19   Ht 1.829 m (6')  Wt 66 kg   SpO2 97%   BMI 19.73 kg/m  Physical Exam Vitals and nursing note reviewed.  Constitutional:      Appearance: He is well-developed.  HENT:     Head:     Comments: Scarring and grafting noted over the right naris with deformity of the nose Eyes:     Pupils: Pupils are equal, round, and reactive to light.  Cardiovascular:     Rate and Rhythm: Normal rate and regular rhythm.     Heart sounds: Normal heart sounds. No murmur heard. Pulmonary:     Effort: Pulmonary effort is normal. No respiratory distress.     Breath sounds: Normal breath sounds. No wheezing.     Comments: No respiratory distress, no significant wheezing noted Abdominal:     General: Bowel sounds are normal.      Palpations: Abdomen is soft.     Tenderness: There is no abdominal tenderness. There is no rebound.  Musculoskeletal:     Cervical back: Neck supple.  Lymphadenopathy:     Cervical: No cervical adenopathy.  Skin:    General: Skin is warm and dry.  Neurological:     Mental Status: He is alert and oriented to person, place, and time.  Psychiatric:        Mood and Affect: Mood normal.     ED Results / Procedures / Treatments   Labs (all labs ordered are listed, but only abnormal results are displayed) Labs Reviewed  CBC WITH DIFFERENTIAL/PLATELET - Abnormal; Notable for the following components:      Result Value   Neutro Abs 8.1 (*)    All other components within normal limits  BASIC METABOLIC PANEL - Abnormal; Notable for the following components:   CO2 20 (*)    Glucose, Bld 304 (*)    BUN 42 (*)    Creatinine, Ser 1.93 (*)    Calcium 8.6 (*)    GFR, Estimated 37 (*)    All other components within normal limits  TROPONIN I (HIGH SENSITIVITY) - Abnormal; Notable for the following components:   Troponin I (High Sensitivity) 42 (*)    All other components within normal limits    EKG EKG Interpretation  Date/Time:  Wednesday May 04 2022 00:41:17 EDT Ventricular Rate:  75 PR Interval:  273 QRS Duration: 122 QT Interval:  425 QTC Calculation: 475 R Axis:   16 Text Interpretation: Sinus or ectopic atrial rhythm Ventricular premature complex Prolonged PR interval Anterior infarct, old Nonspecific T abnormalities, lateral leads Baseline wander in lead(s) V3 Confirmed by Thayer Jew [phone]) on 05/04/2022 1:25:01 AM  Radiology DG Chest Portable 1 View  Result Date: 05/04/2022 CLINICAL DATA:  Shortness of breath EXAM: PORTABLE CHEST 1 VIEW COMPARISON:  Radiographs 04/29/2022 FINDINGS: Stable cardiomediastinal silhouette. Aortic atherosclerotic calcification. Hyperinflation and chronic bronchitic change. No focal consolidation, pleural effusion, or pneumothorax. No acute  osseous abnormality. IMPRESSION: 1. No active disease. 2. COPD. Electronically Signed   By: Placido Sou M.D.   On: 05/04/2022 00:55    Procedures Procedures    Medications Ordered in ED Medications  carvedilol (COREG) tablet 6.25 mg (has no administration in time range)  albuterol (VENTOLIN HFA) 108 (90 Base) MCG/ACT inhaler 2 puff (2 puffs Inhalation Given 05/04/22 0128)    ED Course/ Medical Decision Making/ A&P                             Medical Decision Making  Amount and/or Complexity of Data Reviewed Labs: ordered. Radiology: ordered.  Risk Prescription drug management.   This patient presents to the ED for concern of shortness of breath, this involves an extensive number of treatment options, and is a complaint that carries with it a high risk of complications and morbidity.  I considered the following differential and admission for this acute, potentially life threatening condition.  The differential diagnosis includes COPD, pneumonia, pneumothorax, hypertension, atypical ACS  MDM:    This is a 67 year old male who presents with reported shortness of breath.  He is in no respiratory distress.  Lung sounds are actually clear although he does have a history of COPD.  Per a phone call from EMS, patient began to complain of symptoms as he was about to be arrested.  He was just discharged from hospital yesterday afternoon.  He did have adjustments in his blood pressure medications and presents today hypertensive.  He is not have any active chest pain.  EKG shows no evidence of acute ischemia or arrhythmia.  Troponin is at the patient's baseline.  He is hyperglycemic but without anion gap.  Chest x-ray does not show any pneumothorax or pneumonia.  There is no leukocytosis on CBC.  He was given an inhaler but I doubt COPD exacerbation.  He is currently on a prednisone taper.  He was given his dose of Coreg.  Would hesitate to be aggressive putting blood pressure medications back in  place given his recent hospitalization and hypotension.  Would defer to his primary physician.  Recommend close follow-up for blood pressure recheck and adjustment at that time.  Doubt hypertensive urgency or emergency.  (Labs, imaging, consults)  Labs: I Ordered, and personally interpreted labs.  The pertinent results include: CBC, BMP, troponin  Imaging Studies ordered: I ordered imaging studies including chest x-ray I independently visualized and interpreted imaging. I agree with the radiologist interpretation  Additional history obtained from chart review.  External records from outside source obtained and reviewed including discharge summary  Cardiac Monitoring: The patient was maintained on a cardiac monitor.  If on the cardiac monitor, I personally viewed and interpreted the cardiac monitored which showed an underlying rhythm of: Sinus rhythm  Reevaluation: After the interventions noted above, I reevaluated the patient and found that they have :stayed the same  Social Determinants of Health:  housing instability  Disposition: Discharge  Co morbidities that complicate the patient evaluation  Past Medical History:  Diagnosis Date   Aortic atherosclerosis 08/03/2021   CAD (coronary artery disease) 08/03/2021   Cardiac catheterization September 2023 with RCA/RV marginal stenosis managed medically   Cardiomyopathy    Carotid artery disease    CKD (chronic kidney disease) stage 3, GFR 30-59 ml/min    COPD (chronic obstructive pulmonary disease)    Essential hypertension    Head and neck cancer 2019   Right facial basal cell carcinoma s/p resection with right partial mastectomy and partal rhinectomy with skin graft (06/13/17)   Iron deficiency anemia    Urinary retention      Medicines Meds ordered this encounter  Medications   albuterol (VENTOLIN HFA) 108 (90 Base) MCG/ACT inhaler 2 puff   carvedilol (COREG) tablet 6.25 mg    I have reviewed the patients home  medicines and have made adjustments as needed  Problem List / ED Course: Problem List Items Addressed This Visit   None Visit Diagnoses     SOB (shortness of breath)    -  Primary  Hypertension, unspecified type       Relevant Medications   carvedilol (COREG) tablet 6.25 mg (Start on 05/04/2022  1:45 AM)                   Final Clinical Impression(s) / ED Diagnoses Final diagnoses:  SOB (shortness of breath)  Hypertension, unspecified type    Rx / DC Orders ED Discharge Orders     None         Merryl Hacker, MD 05/04/22 [phone]

## 2022-05-04 NOTE — Discharge Instructions (Signed)
You were seen today for shortness of breath.  Your workup is reassuring.  You recently had a hospitalization and blood pressure medications were adjusted.  Make sure that you take your medications as prescribed and follow-up closely with your primary doctor for ongoing adjustments.  Use your inhaler as needed.

## 2022-05-04 NOTE — Telephone Encounter (Signed)
Pt is in the office stating that he was at [facility] for 12 days and they changed his medications. He is concerned about this and wants to talk to Dr. Domenic Polite about it.

## 2022-05-04 NOTE — ED Notes (Signed)
Urinal placed at pt bedside w/in reach

## 2022-05-05 ENCOUNTER — Observation Stay (HOSPITAL_COMMUNITY)
Admission: EM | Admit: 2022-05-05 | Discharge: 2022-05-06 | Disposition: A | Discharge status: Home / Self Care | Payer: 59 | Attending: Internal Medicine | Admitting: Internal Medicine

## 2022-05-05 ENCOUNTER — Other Ambulatory Visit: Payer: Self-pay

## 2022-05-05 ENCOUNTER — Encounter (HOSPITAL_COMMUNITY): Payer: Self-pay | Admitting: Emergency Medicine

## 2022-05-05 ENCOUNTER — Emergency Department (HOSPITAL_COMMUNITY): Payer: 59

## 2022-05-05 DIAGNOSIS — I10 Essential (primary) hypertension: Secondary | ICD-10-CM | POA: Diagnosis not present

## 2022-05-05 DIAGNOSIS — I428 Other cardiomyopathies: Secondary | ICD-10-CM

## 2022-05-05 DIAGNOSIS — Z7902 Long term (current) use of antithrombotics/antiplatelets: Secondary | ICD-10-CM | POA: Insufficient documentation

## 2022-05-05 DIAGNOSIS — I509 Heart failure, unspecified: Secondary | ICD-10-CM | POA: Diagnosis not present

## 2022-05-05 DIAGNOSIS — J449 Chronic obstructive pulmonary disease, unspecified: Secondary | ICD-10-CM | POA: Diagnosis not present

## 2022-05-05 DIAGNOSIS — I5022 Chronic systolic (congestive) heart failure: Secondary | ICD-10-CM | POA: Diagnosis not present

## 2022-05-05 DIAGNOSIS — J441 Chronic obstructive pulmonary disease with (acute) exacerbation: Secondary | ICD-10-CM | POA: Diagnosis not present

## 2022-05-05 DIAGNOSIS — I130 Hypertensive heart and chronic kidney disease with heart failure and stage 1 through stage 4 chronic kidney disease, or unspecified chronic kidney disease: Secondary | ICD-10-CM | POA: Diagnosis not present

## 2022-05-05 DIAGNOSIS — Z85828 Personal history of other malignant neoplasm of skin: Secondary | ICD-10-CM | POA: Diagnosis not present

## 2022-05-05 DIAGNOSIS — R0602 Shortness of breath: Principal | ICD-10-CM | POA: Insufficient documentation

## 2022-05-05 DIAGNOSIS — I2510 Atherosclerotic heart disease of native coronary artery without angina pectoris: Secondary | ICD-10-CM | POA: Insufficient documentation

## 2022-05-05 DIAGNOSIS — I110 Hypertensive heart disease with heart failure: Secondary | ICD-10-CM | POA: Diagnosis not present

## 2022-05-05 DIAGNOSIS — I6529 Occlusion and stenosis of unspecified carotid artery: Secondary | ICD-10-CM | POA: Diagnosis not present

## 2022-05-05 DIAGNOSIS — Z79899 Other long term (current) drug therapy: Secondary | ICD-10-CM | POA: Diagnosis not present

## 2022-05-05 DIAGNOSIS — I429 Cardiomyopathy, unspecified: Secondary | ICD-10-CM | POA: Diagnosis not present

## 2022-05-05 DIAGNOSIS — D508 Other iron deficiency anemias: Secondary | ICD-10-CM | POA: Diagnosis not present

## 2022-05-05 DIAGNOSIS — Z87891 Personal history of nicotine dependence: Secondary | ICD-10-CM | POA: Diagnosis not present

## 2022-05-05 DIAGNOSIS — R7989 Other specified abnormal findings of blood chemistry: Secondary | ICD-10-CM | POA: Diagnosis present

## 2022-05-05 DIAGNOSIS — I5023 Acute on chronic systolic (congestive) heart failure: Secondary | ICD-10-CM | POA: Insufficient documentation

## 2022-05-05 DIAGNOSIS — D509 Iron deficiency anemia, unspecified: Secondary | ICD-10-CM | POA: Diagnosis present

## 2022-05-05 DIAGNOSIS — N1832 Chronic kidney disease, stage 3b: Secondary | ICD-10-CM | POA: Diagnosis not present

## 2022-05-05 DIAGNOSIS — N183 Chronic kidney disease, stage 3 unspecified: Secondary | ICD-10-CM | POA: Diagnosis present

## 2022-05-05 DIAGNOSIS — I5021 Acute systolic (congestive) heart failure: Principal | ICD-10-CM

## 2022-05-05 DIAGNOSIS — I5042 Chronic combined systolic (congestive) and diastolic (congestive) heart failure: Secondary | ICD-10-CM | POA: Diagnosis present

## 2022-05-05 LAB — CBC
HCT: 46.4 % (ref 39.0–52.0)
Hemoglobin: 14.7 g/dL (ref 13.0–17.0)
MCH: 28.3 pg (ref 26.0–34.0)
MCHC: 31.7 g/dL (ref 30.0–36.0)
MCV: 89.2 fL (ref 80.0–100.0)
Platelets: 273 10*3/uL (ref 150–400)
RBC: 5.2 MIL/uL (ref 4.22–5.81)
RDW: 14.9 % (ref 11.5–15.5)
WBC: 9.9 10*3/uL (ref 4.0–10.5)
nRBC: 0 % (ref 0.0–0.2)

## 2022-05-05 LAB — RAPID URINE DRUG SCREEN, HOSP PERFORMED
Amphetamines: NOT DETECTED
Barbiturates: NOT DETECTED
Benzodiazepines: NOT DETECTED
Cocaine: NOT DETECTED
Opiates: NOT DETECTED
Tetrahydrocannabinol: NOT DETECTED

## 2022-05-05 LAB — BASIC METABOLIC PANEL
Anion gap: 8 (ref 5–15)
BUN: 41 mg/dL — ABNORMAL HIGH (ref 8–23)
CO2: 24 mmol/L (ref 22–32)
Calcium: 8.7 mg/dL — ABNORMAL LOW (ref 8.9–10.3)
Chloride: 110 mmol/L (ref 98–111)
Creatinine, Ser: 1.88 mg/dL — ABNORMAL HIGH (ref 0.61–1.24)
GFR, Estimated: 39 mL/min — ABNORMAL LOW (ref >60)
Glucose, Bld: 119 mg/dL — ABNORMAL HIGH (ref 70–99)
Potassium: 4.4 mmol/L (ref 3.5–5.1)
Sodium: 142 mmol/L (ref 135–145)

## 2022-05-05 LAB — BRAIN NATRIURETIC PEPTIDE: B Natriuretic Peptide: 2998 pg/mL — ABNORMAL HIGH (ref 0.0–100.0)

## 2022-05-05 LAB — URINALYSIS, W/ REFLEX TO CULTURE (INFECTION SUSPECTED)
Bacteria, UA: NONE SEEN
Bilirubin Urine: NEGATIVE
Glucose, UA: NEGATIVE mg/dL
Hgb urine dipstick: NEGATIVE
Ketones, ur: NEGATIVE mg/dL
Nitrite: NEGATIVE
Protein, ur: NEGATIVE mg/dL
Specific Gravity, Urine: 1.005 (ref 1.005–1.030)
pH: 5 (ref 5.0–8.0)

## 2022-05-05 LAB — TROPONIN I (HIGH SENSITIVITY)
Troponin I (High Sensitivity): 73 ng/L — ABNORMAL HIGH (ref <18)
Troponin I (High Sensitivity): 80 ng/L — ABNORMAL HIGH (ref <18)

## 2022-05-05 MED ORDER — ACETAMINOPHEN 325 MG PO TABS: 650.0000 mg | ORAL_TABLET | ORAL | Status: DC | PRN

## 2022-05-05 MED ORDER — HEPARIN SODIUM (PORCINE) 5000 UNIT/ML IJ SOLN
5000.0000 [IU] | Freq: Three times a day (TID) | INTRAMUSCULAR | Status: DC
  Administered 2022-05-05 – 2022-05-06 (×2): 5000 [IU] via SUBCUTANEOUS
  Filled 2022-05-05 (×3): qty 1

## 2022-05-05 MED ORDER — ARFORMOTEROL TARTRATE 15 MCG/2ML IN NEBU
15.0000 ug | INHALATION_SOLUTION | Freq: Two times a day (BID) | RESPIRATORY_TRACT | Status: DC
  Administered 2022-05-05 – 2022-05-06 (×3): 15 ug via RESPIRATORY_TRACT
  Filled 2022-05-05 (×3): qty 2

## 2022-05-05 MED ORDER — FUROSEMIDE 10 MG/ML IJ SOLN
40.0000 mg | Freq: Two times a day (BID) | INTRAMUSCULAR | Status: DC
  Filled 2022-05-05: qty 4

## 2022-05-05 MED ORDER — ONDANSETRON HCL 4 MG/2ML IJ SOLN: 4.0000 mg | Freq: Four times a day (QID) | INTRAMUSCULAR | Status: DC | PRN

## 2022-05-05 MED ORDER — SODIUM CHLORIDE 0.9 % IV SOLN: 250.0000 mL | INTRAVENOUS | Status: DC | PRN

## 2022-05-05 MED ORDER — SODIUM CHLORIDE 0.9% FLUSH
3.0000 mL | Freq: Two times a day (BID) | INTRAVENOUS | Status: DC
  Administered 2022-05-05 – 2022-05-06 (×3): 3 mL via INTRAVENOUS

## 2022-05-05 MED ORDER — IPRATROPIUM-ALBUTEROL 0.5-2.5 (3) MG/3ML IN SOLN
3.0000 mL | Freq: Four times a day (QID) | RESPIRATORY_TRACT | Status: DC
  Administered 2022-05-05: 3 mL via RESPIRATORY_TRACT
  Filled 2022-05-05: qty 3

## 2022-05-05 MED ORDER — FUROSEMIDE 10 MG/ML IJ SOLN
40.0000 mg | Freq: Once | INTRAMUSCULAR | Status: AC
  Administered 2022-05-05: 40 mg via INTRAVENOUS

## 2022-05-05 MED ORDER — METHYLPREDNISOLONE SODIUM SUCC 40 MG IJ SOLR
40.0000 mg | Freq: Two times a day (BID) | INTRAMUSCULAR | Status: DC
  Administered 2022-05-05 (×2): 40 mg via INTRAVENOUS
  Filled 2022-05-05 (×3): qty 1

## 2022-05-05 MED ORDER — CLOPIDOGREL BISULFATE 75 MG PO TABS
75.0000 mg | ORAL_TABLET | Freq: Every day | ORAL | Status: DC
  Administered 2022-05-05 – 2022-05-06 (×2): 75 mg via ORAL
  Filled 2022-05-05 (×2): qty 1

## 2022-05-05 MED ORDER — IPRATROPIUM-ALBUTEROL 0.5-2.5 (3) MG/3ML IN SOLN
3.0000 mL | Freq: Two times a day (BID) | RESPIRATORY_TRACT | Status: DC
  Administered 2022-05-06: 3 mL via RESPIRATORY_TRACT
  Filled 2022-05-05 (×2): qty 3

## 2022-05-05 MED ORDER — BUDESONIDE 0.5 MG/2ML IN SUSP
0.5000 mg | Freq: Two times a day (BID) | RESPIRATORY_TRACT | Status: DC
  Administered 2022-05-05 – 2022-05-06 (×3): 0.5 mg via RESPIRATORY_TRACT
  Filled 2022-05-05 (×3): qty 2

## 2022-05-05 MED ORDER — FUROSEMIDE 10 MG/ML IJ SOLN
40.0000 mg | Freq: Once | INTRAMUSCULAR | Status: AC
  Administered 2022-05-05: 40 mg via INTRAVENOUS
  Filled 2022-05-05: qty 4

## 2022-05-05 MED ORDER — ATORVASTATIN CALCIUM 40 MG PO TABS
80.0000 mg | ORAL_TABLET | Freq: Every evening | ORAL | Status: DC
  Administered 2022-05-05: 80 mg via ORAL
  Filled 2022-05-05: qty 2

## 2022-05-05 MED ORDER — SODIUM CHLORIDE 0.9% FLUSH: 3.0000 mL | INTRAVENOUS | Status: DC | PRN

## 2022-05-05 MED ORDER — IPRATROPIUM-ALBUTEROL 0.5-2.5 (3) MG/3ML IN SOLN
3.0000 mL | Freq: Four times a day (QID) | RESPIRATORY_TRACT | Status: DC | PRN
  Administered 2022-05-05: 3 mL via RESPIRATORY_TRACT

## 2022-05-05 MED ORDER — CARVEDILOL 3.125 MG PO TABS: 6.2500 mg | ORAL_TABLET | Freq: Every day | ORAL | Status: DC

## 2022-05-05 MED ORDER — ISOSORBIDE MONONITRATE ER 60 MG PO TB24
60.0000 mg | ORAL_TABLET | Freq: Every evening | ORAL | Status: DC
  Administered 2022-05-05: 60 mg via ORAL
  Filled 2022-05-05: qty 1

## 2022-05-05 NOTE — ED Notes (Signed)
Pt denies SOB or pain at this time. Clear diminished lung sounds noted. Patient does not appear in distress at this time. Patient requesting water.

## 2022-05-05 NOTE — ED Provider Notes (Signed)
[facility] Provider Note   CSN: [id number] Arrival date & time: 05/05/22  0001     History  Chief Complaint  Patient presents with   Shortness of Breath    Michael [name] is a 67 y.o. male.  Patient presents to the emergency department for evaluation of shortness of breath.  Patient was actually seen in the ED twice in the last 24 hours.  He had a fall and was also seen for shortness of breath.  Patient reports that he is still feeling short of breath.  He does not have associated chest pain.       Home Medications Prior to Admission medications   Medication Sig Start Date End Date Taking? Authorizing Provider  acetaminophen (TYLENOL) 650 MG CR tablet Take 1,300 mg by mouth as needed for pain.    [provider]  albuterol (VENTOLIN HFA) 108 (90 Base) MCG/ACT inhaler Inhale 2 puffs into the lungs every 6 (six) hours as needed for wheezing or shortness of breath. 04/27/22   Shahmehdi, Valeria Batman, MD  atorvastatin (LIPITOR) 80 MG tablet Take 1 tablet (80 mg total) by mouth every evening. 04/27/22   Shahmehdi, Valeria Batman, MD  Budeson-Glycopyrrol-Formoterol (BREZTRI AEROSPHERE) 160-9-4.8 MCG/ACT AERO Inhale 2 puffs into the lungs 2 (two) times daily.    [provider]  carvedilol (COREG) 6.25 MG tablet Take 1 tablet (6.25 mg total) by mouth daily. 05/02/22   Little Ishikawa, MD  clopidogrel (PLAVIX) 75 MG tablet Take 1 tablet (75 mg total) by mouth daily. 04/27/22   Shahmehdi, Valeria Batman, MD  ibuprofen (ADVIL) 200 MG tablet Take 800 mg by mouth as needed for moderate pain.    [provider]  isosorbide mononitrate (IMDUR) 60 MG 24 hr tablet Take 1 tablet (60 mg total) by mouth every evening. 04/27/22   Shahmehdi, Valeria Batman, MD  potassium chloride SA (KLOR-CON M) 20 MEQ tablet Take 1 tablet (20 mEq total) by mouth daily. 05/04/22   Lajean Saver, MD  predniSONE (DELTASONE) 10 MG tablet Take 4 tablets (40 mg total) by mouth daily  for 3 days, THEN 3 tablets (30 mg total) daily for 3 days, THEN 2 tablets (20 mg total) daily for 3 days, THEN 1 tablet (10 mg total) daily for 3 days. 05/01/22 05/13/22  Little Ishikawa, MD      Allergies    Patient has no known allergies.    Review of Systems   Review of Systems  Physical Exam Updated Vital Signs BP (!) 154/104 (BP Location: Right Arm)   Pulse 62   Temp 97.9 F (36.6 C) (Oral)   Resp 17   Ht 6' (1.829 m)   Wt 66 kg   SpO2 99%   BMI 19.73 kg/m  Physical Exam Vitals and nursing note reviewed.  Constitutional:      General: He is not in acute distress.    Appearance: He is well-developed.  HENT:     Head: Normocephalic and atraumatic.     Mouth/Throat:     Mouth: Mucous membranes are moist.  Eyes:     General: Vision grossly intact. Gaze aligned appropriately.     Extraocular Movements: Extraocular movements intact.     Conjunctiva/sclera: Conjunctivae normal.  Cardiovascular:     Rate and Rhythm: Normal rate and regular rhythm.     Pulses: Normal pulses.     Heart sounds: Normal heart sounds, S1 normal and S2 normal. No murmur heard.  No friction rub. No gallop.  Pulmonary:     Effort: Pulmonary effort is normal. Tachypnea present. No respiratory distress.     Breath sounds: Normal breath sounds.  Abdominal:     Palpations: Abdomen is soft.     Tenderness: There is no abdominal tenderness. There is no guarding or rebound.     Hernia: No hernia is present.  Musculoskeletal:        General: No swelling.     Cervical back: Full passive range of motion without pain, normal range of motion and neck supple. No pain with movement, spinous process tenderness or muscular tenderness. Normal range of motion.     Right lower leg: No edema.     Left lower leg: No edema.  Skin:    General: Skin is warm and dry.     Capillary Refill: Capillary refill takes less than 2 seconds.     Findings: No ecchymosis, erythema, lesion or wound.  Neurological:      Mental Status: He is alert and oriented to person, place, and time.     GCS: GCS eye subscore is 4. GCS verbal subscore is 5. GCS motor subscore is 6.     Cranial Nerves: Cranial nerves 2-12 are intact.     Sensory: Sensation is intact.     Motor: Motor function is intact. No weakness or abnormal muscle tone.     Coordination: Coordination is intact.  Psychiatric:        Mood and Affect: Mood normal.        Speech: Speech normal.        Behavior: Behavior normal.     ED Results / Procedures / Treatments   Labs (all labs ordered are listed, but only abnormal results are displayed) Labs Reviewed  BASIC METABOLIC PANEL - Abnormal; Notable for the following components:      Result Value   Glucose, Bld 119 (*)    BUN 41 (*)    Creatinine, Ser 1.88 (*)    Calcium 8.7 (*)    GFR, Estimated 39 (*)    All other components within normal limits  BRAIN NATRIURETIC PEPTIDE - Abnormal; Notable for the following components:   B Natriuretic Peptide 2,998.0 (*)    All other components within normal limits  TROPONIN I (HIGH SENSITIVITY) - Abnormal; Notable for the following components:   Troponin I (High Sensitivity) 73 (*)    All other components within normal limits  CBC  TROPONIN I (HIGH SENSITIVITY)    EKG None  Radiology DG Chest 2 View  Result Date: 05/05/2022 CLINICAL DATA:  Shortness of breath. EXAM: CHEST - 2 VIEW COMPARISON:  Chest radiograph dated 05/04/2022. FINDINGS: No focal consolidation, pleural effusion, or pneumothorax. The cardiac silhouette is within normal limits. No acute osseous pathology. IMPRESSION: No active cardiopulmonary disease. Electronically Signed   By: Anner Crete M.D.   On: 05/05/2022 03:55   CT Head Wo Contrast  Result Date: 05/04/2022 CLINICAL DATA:  Head trauma, minor (Age >= 65y) EXAM: CT HEAD WITHOUT CONTRAST TECHNIQUE: Contiguous axial images were obtained from the base of the skull through the vertex without intravenous contrast. RADIATION DOSE  REDUCTION: This exam was performed according to the departmental dose-optimization program which includes automated exposure control, adjustment of the mA and/or kV according to patient size and/or use of iterative reconstruction technique. COMPARISON:  04/29/2022 FINDINGS: Brain: There is atrophy and chronic small vessel disease changes. Old left basal ganglia lacunar infarct. No acute intracranial abnormality. Specifically, no  hemorrhage, hydrocephalus, mass lesion, acute infarction, or significant intracranial injury. Vascular: No hyperdense vessel or unexpected calcification. Skull: No acute calvarial abnormality. Sinuses/Orbits: No acute findings Other: None IMPRESSION: Old left basal ganglia lacunar infarct. Atrophy, chronic microvascular disease. No acute intracranial abnormality. Electronically Signed   By: Rolm Baptise M.D.   On: 05/04/2022 12:59   DG Chest Port 1 View  Result Date: 05/04/2022 CLINICAL DATA:  Shortness of breath and. EXAM: PORTABLE CHEST 1 VIEW COMPARISON:  Portable exam 1205 hours compared to 05/04/2022 FINDINGS: Upper normal size of cardiac silhouette. Atherosclerotic calcification aorta. Mediastinal contours and pulmonary vascularity normal. Lungs clear. No acute infiltrate, pleural effusion, or pneumothorax. Osseous demineralization. IMPRESSION: No acute abnormalities. Aortic Atherosclerosis (ICD10-I70.0). Electronically Signed   By: Lavonia Dana M.D.   On: 05/04/2022 12:13   DG Chest Portable 1 View  Result Date: 05/04/2022 CLINICAL DATA:  Shortness of breath EXAM: PORTABLE CHEST 1 VIEW COMPARISON:  Radiographs 04/29/2022 FINDINGS: Stable cardiomediastinal silhouette. Aortic atherosclerotic calcification. Hyperinflation and chronic bronchitic change. No focal consolidation, pleural effusion, or pneumothorax. No acute osseous abnormality. IMPRESSION: 1. No active disease. 2. COPD. Electronically Signed   By: Placido Sou M.D.   On: 05/04/2022 00:55    Procedures Procedures     Medications Ordered in ED Medications  furosemide (LASIX) injection 40 mg (has no administration in time range)    ED Course/ Medical Decision Making/ A&P                             Medical Decision Making Amount and/or Complexity of Data Reviewed External Data Reviewed: labs, radiology, ECG and notes. Labs: ordered. Decision-making details documented in ED Course. Radiology: ordered and independent interpretation performed. Decision-making details documented in ED Course. ECG/medicine tests: ordered and independent interpretation performed. Decision-making details documented in ED Course.   Patient returns with shortness of breath.  Records reviewed from his most recent hospitalization and ED visits.  He was admitted to [facility] for near syncope and found to be orthostatic, given IV fluids during his hospital stay.  He has been seen in the ED with shortness of breath since discharge.  He does have some COPD and was treated for COPD and discharged.  Patient with persistent shortness of breath now.  He does have some tachypnea.  Workup was initiated.  Patient appears to be volume overloaded.  BNP is nearly 3000.  Troponin elevated as well.  He is not experiencing chest pain, in the setting this is consistent with leak secondary to heart failure.  Patient did have a recent echo that showed ejection fraction of 35 to 40%.  He is not currently on diuretics.  He will require hospitalization for diuresis.         Final Clinical Impression(s) / ED Diagnoses Final diagnoses:  Acute systolic congestive heart failure    Rx / DC Orders ED Discharge Orders     None         Adabella Stanis, Gwenyth Allegra, MD 05/05/22 [phone]

## 2022-05-05 NOTE — Consult Note (Addendum)
Cardiology Consultation   Patient ID: Michael [name] MRN: [id number]; DOB: [birth year]  Admit date: 05/05/2022 Date of Consult: 05/05/2022  PCP:  [name], [place] [facility] Cardiologist:  Rozann Lesches, MD        Patient Profile:   Michael [name] is a 67 y.o. male with a hx of HFrEF (EF 30% in 10/2021, at 35-40% in 04/2022), CAD (cath in 10/2021 showing mild to moderate obstructive CAD along mid-RCA and acute Mrg with medical management recommended), carotid artery stenosis (s/p right TCAR in 01/2021), iron deficiency anemia, HTN, HLD,  COPD and Stage 3 CKD who is being seen 05/05/2022 for the evaluation of CHF at the request of Dr. Carles Collet.  History of Present Illness:   Michael [name] was recently followed by the Cardiology service during an admission at [facility] from 3/25 - 04/27/2022 for an acute CHF exacerbation. He reported worsening dyspnea for the past year and by review of medications, he reported frequently not taking medications or would take both his morning and evening medications at the same time. He was continued on Coreg and Entresto with Aldactone, SGLT2 inhibitor and Lasix not being initiated with plans for follow-up in the outpatient setting and could consider adding at that time. His discharge was delayed secondary to homelessness.  He was admitted at [facility] from 3/29 - 05/03/2022 for evaluation of a syncopal episode. He was found to be orthostatic and received IV fluids during admission. Given his soft BP, Amlodipine and Entresto were discontinued. He was continued on Atorvastatin 80 mg daily, Carvedilol 6.25 mg once daily (?), Plavix 75mg  daily and Imdur 60mg  daily.  Was evaluated in ED yesterday morning for elevated BP of 200/93. BP improved while in the ED and he was discharged home and informed to follow-up with his PCP. Presented back to the ED later in the morning for a fall. CT Head showed an old left basal ganglia lacunar infarct but no acute  intracranial abnormalities and is discharged home. He again presented back to the ED early this morning for evaluation of dyspnea. Initial labs showed WBC 9.9, Hgb 14.7, platelets 273, Na+ 142, K+ 4.4 and creatinine 1.88 (close to baseline). BNP at 2998 (previously >4500 in 10/2021 and 10/2021, at 2225 approximately 6 days ago). Initial and repeat Hs Troponin values at 73 and 80. UDS negative. CXR with no active cardiopulmonary disease. EKG shows sinus bradycardia, HR 58 with baseline artifact and LVH with TWI along the lateral leads which is noted on prior tracings and likely secondary to repol.   He received IV Lasix 40mg  around 0450 but additional diuretics have not been ordered. Net output of -1.3 L thus far.   He reports he "needs a stress test". Does not recall having his heart cath last year and we reviewed this. He has baseline dyspnea on exertion but no chest pain or palpitations. Says he passed out yesterday while walking. No prodromal symptoms. Believes he lost consciousness for less than 30 seconds. He denies any specific abdominal distension or lower extremity edema. While he is currently homeless, he reports he does take his medications as they are free at his pharmacy. He is unsure how frequently he takes Coreg but confirms he did stop Amlodipine and Entresto.    Past Medical History:  Diagnosis Date   Aortic atherosclerosis 08/03/2021   CAD (coronary artery disease) 08/03/2021   Cardiac catheterization September 2023 with RCA/RV marginal stenosis managed medically   Cardiomyopathy  Carotid artery disease    CKD (chronic kidney disease) stage 3, GFR 30-59 ml/min    COPD (chronic obstructive pulmonary disease)    Essential hypertension    Head and neck cancer 2019   Right facial basal cell carcinoma s/p resection with right partial mastectomy and partal rhinectomy with skin graft (06/13/17)   Iron deficiency anemia    Urinary retention     Past Surgical History:  Procedure  Laterality Date   ANKLE CLOSED REDUCTION Right    open reduction   BASAL CELL CARCINOMA EXCISION  2019   at [facility]   BIOPSY  07/19/2021   Procedure: BIOPSY;  Surgeon: Eloise Harman, DO;  [facility];  Service: Endoscopy;;   COLONOSCOPY N/A 05/31/2017   Procedure: COLONOSCOPY;  Surgeon: Daneil Dolin, MD;  [facility];  Service: Endoscopy;  Laterality: N/A;  2:45pm   COLONOSCOPY WITH PROPOFOL N/A 07/19/2021   Procedure: COLONOSCOPY WITH PROPOFOL;  Surgeon: Eloise Harman, DO;  [facility];  Service: Endoscopy;  Laterality: N/A;  3:00pm, moved up to 9:00   CYSTOSCOPY N/A 10/29/2018   Procedure: CYSTOSCOPY, CLOT EVACUATION;  Surgeon: Ceasar Mons, MD;  Location: [facility];  Service: Urology;  Laterality: N/A;   PARTIAL COLECTOMY Right 08/11/2021   Procedure: PARTIAL COLECTOMY, OPEN RIGHT HEMICOLECTOMY;  Surgeon: Virl Cagey, MD;  Location: [facility];  Service: General;  Laterality: Right;   POLYPECTOMY  05/31/2017   Procedure: POLYPECTOMY;  Surgeon: Daneil Dolin, MD;  [facility];  Service: Endoscopy;;   RIGHT/LEFT HEART CATH AND CORONARY ANGIOGRAPHY N/A 10/22/2021   Procedure: RIGHT/LEFT HEART CATH AND CORONARY ANGIOGRAPHY;  Surgeon: Early Osmond, MD;  Location: [facility];  Service: Cardiovascular;  Laterality: N/A;   TONSILLECTOMY     TRANSCAROTID ARTERY REVASCULARIZATION  Right 02/15/2021   Procedure: RIGHT TRANSCAROTID ARTERY REVASCULARIZATION;  Surgeon: [name] Heck, MD;  Location: [facility];  Service: Vascular;  Laterality: Right;   ULTRASOUND GUIDANCE FOR VASCULAR ACCESS Left 02/15/2021   Procedure: ULTRASOUND GUIDANCE FOR VASCULAR ACCESS;  Surgeon: [name] Heck, MD;  Location: [facility];  Service: Vascular;  Laterality: Left;   XI ROBOTIC ASSISTED SIMPLE PROSTATECTOMY N/A 10/29/2018   Procedure: XI ROBOTIC ASSISTED SIMPLE PROSTATECTOMY;  Surgeon: Cleon Gustin, MD;  Location: [facility];  Service:  Urology;  Laterality: N/A;     Home Medications:  Prior to Admission medications   Medication Sig Start Date End Date Taking? Authorizing Provider  acetaminophen (TYLENOL) 650 MG CR tablet Take 1,300 mg by mouth as needed for pain.    [provider]  albuterol (VENTOLIN HFA) 108 (90 Base) MCG/ACT inhaler Inhale 2 puffs into the lungs every 6 (six) hours as needed for wheezing or shortness of breath. 04/27/22   Shahmehdi, Valeria Batman, MD  atorvastatin (LIPITOR) 80 MG tablet Take 1 tablet (80 mg total) by mouth every evening. 04/27/22   Shahmehdi, Valeria Batman, MD  Budeson-Glycopyrrol-Formoterol (BREZTRI AEROSPHERE) 160-9-4.8 MCG/ACT AERO Inhale 2 puffs into the lungs 2 (two) times daily.    [provider]  carvedilol (COREG) 6.25 MG tablet Take 1 tablet (6.25 mg total) by mouth daily. 05/02/22   Little Ishikawa, MD  clopidogrel (PLAVIX) 75 MG tablet Take 1 tablet (75 mg total) by mouth daily. 04/27/22   Shahmehdi, Valeria Batman, MD  ibuprofen (ADVIL) 200 MG tablet Take 800 mg by mouth as needed for moderate pain.    [provider]  isosorbide  mononitrate (IMDUR) 60 MG 24 hr tablet Take 1 tablet (60 mg total) by mouth every evening. 04/27/22   Shahmehdi, Valeria Batman, MD  potassium chloride SA (KLOR-CON M) 20 MEQ tablet Take 1 tablet (20 mEq total) by mouth daily. 05/04/22   Lajean Saver, MD  predniSONE (DELTASONE) 10 MG tablet Take 4 tablets (40 mg total) by mouth daily for 3 days, THEN 3 tablets (30 mg total) daily for 3 days, THEN 2 tablets (20 mg total) daily for 3 days, THEN 1 tablet (10 mg total) daily for 3 days. 05/01/22 05/13/22  Little Ishikawa, MD    Inpatient Medications: Scheduled Meds:  arformoterol  15 mcg Nebulization BID   atorvastatin  80 mg Oral QPM   budesonide (PULMICORT) nebulizer solution  0.5 mg Nebulization BID   carvedilol  6.25 mg Oral Daily   clopidogrel  75 mg Oral Daily   heparin  5,000 Units Subcutaneous Q8H   ipratropium-albuterol  3 mL Nebulization  Q6H   isosorbide mononitrate  60 mg Oral QPM   methylPREDNISolone (SOLU-MEDROL) injection  40 mg Intravenous Q12H   sodium chloride flush  3 mL Intravenous Q12H   Continuous Infusions:  sodium chloride     PRN Meds: sodium chloride, acetaminophen, ondansetron (ZOFRAN) IV, sodium chloride flush  Allergies:   No Known Allergies  Social History:   Social History   Socioeconomic History   Marital status: Single    Spouse name: Not on file   Number of children: Not on file   Years of education: Not on file   Highest education level: Not on file  Occupational History   Not on file  Tobacco Use   Smoking status: Former    Packs/day: 0.50    Years: 40.00    Additional pack years: 0.00    Total pack years: 20.00    Types: Cigarettes    Quit date: 07/02/2021    Years since quitting: 0.8    Passive exposure: Never   Smokeless tobacco: Never   Tobacco comments:    Quit on 06/09/21  Vaping Use   Vaping Use: Never used  Substance and Sexual Activity   Alcohol use: Not Currently   Drug use: Never   Sexual activity: Not Currently  Other Topics Concern   Not on file  Social History Narrative   Not on file   Social Determinants of Health   Financial Resource Strain: Low Risk  (12/11/2019)   Overall Financial Resource Strain (CARDIA)    Difficulty of Paying Living Expenses: Not hard at all  Food Insecurity: No Food Insecurity (05/01/2022)   Hunger Vital Sign    Worried About Running Out of Food in the Last Year: Never true    Ran Out of Food in the Last Year: Never true  Transportation Needs: Unmet Transportation Needs (05/01/2022)   PRAPARE - Transportation    Lack of Transportation (Medical): Yes    Lack of Transportation (Non-Medical): Yes  Physical Activity: Inactive (12/11/2019)   Exercise Vital Sign    Days of Exercise per Week: 0 days    Minutes of Exercise per Session: 0 min  Stress: No Stress Concern Present (12/11/2019)   [place]    Feeling of Stress : Not at all  Social Connections: Socially Isolated (12/11/2019)   Social Connection and Isolation Panel [NHANES]    Frequency of Communication with Friends and Family: Never    Frequency of Social Gatherings with Friends and Family:  Never    Attends Religious Services: Never    Active Member of Clubs or Organizations: No    Attends Archivist Meetings: Never    Marital Status: Divorced  Human resources officer Violence: Not At Risk (05/01/2022)   Humiliation, Afraid, Rape, and Kick questionnaire    Fear of Current or Ex-Partner: No    Emotionally Abused: No    Physically Abused: No    Sexually Abused: No    Family History:    Family History  Problem Relation Age of Onset   Stroke Father    Cirrhosis Mother    Colon cancer Neg Hx      ROS:  Please see the history of present illness.   All other ROS reviewed and negative.     Physical Exam/Data:   Vitals:   05/05/22 0630 05/05/22 0700 05/05/22 0720 05/05/22 0808  BP: (!) 185/90 (!) 152/97  (!) 163/106  Pulse: 66 (!) 59  72  Resp: 19 20  20   Temp:   97.9 F (36.6 C) 98.6 F (37 C)  TempSrc:   Oral   SpO2: 98% 98%  100%  Weight:    67.5 kg  Height:        Intake/Output Summary (Last 24 hours) at 05/05/2022 0854 Last data filed at 05/05/2022 0700 Gross per 24 hour  Intake --  Output 1600 ml  Net -1600 ml      05/05/2022    8:08 AM 05/05/2022   12:05 AM 05/04/2022   11:06 AM  Last 3 Weights  Weight (lbs) 148 lb 13 oz 145 lb 8.1 oz 145 lb 8.1 oz  Weight (kg) 67.5 kg 66 kg 66 kg     Body mass index is 20.18 kg/m.  General:  Pleasant, elderly male appearing in no acute distress. HEENT: normal Neck: JVD to jaw line.  Vascular: No carotid bruits; Distal pulses 2+ bilaterally Cardiac:  normal S1, S2; RRR; no murmur. Lungs:  clear to auscultation bilaterally, no wheezing, rhonchi or rales  Abd: soft, nontender, no hepatomegaly  Ext: no pitting  edema Musculoskeletal:  No deformities, BUE and BLE strength normal and equal Skin: warm and dry  Neuro:  CNs 2-12 intact, no focal abnormalities noted Psych:  Normal affect   EKG:  The EKG was personally reviewed and demonstrates: Sinus bradycardia, HR 58 with baseline artifact and LVH with TWI along the lateral leads which is noted on prior tracings and likely secondary to repol.  Telemetry: Not currently available for review.   Relevant CV Studies:  R/LHC: 10/2021   Mid RCA lesion is 70% stenosed.   Acute Mrg lesion is 80% stenosed.   1.  Mild to moderate obstructive coronary artery disease that is outweighed by the patient's degree of LV dysfunction.  There is a significant bifurcation lesion of the right coronary artery and large RV marginal branch.  This is moderately calcified and relatively tortuous.  Given the patient's lack of chest pain, medical therapy should be pursued. 2.  Cardiac output of 6.3 L/min, cardiac index of 3.3 L/min/m, mean RA pressure of 16 mmHg, mean wedge pressure of 21 mmHg, and LVEDP of 21 mmHg.   Recommendation: Goal-directed medical therapy with augmentation of diuretics; consider dual antiplatelet therapy for 1 year for medical treatment of acute coronary syndrome.    Echocardiogram: 04/2022 IMPRESSIONS     1. Left ventricular ejection fraction, by estimation, is 35 to 40%. The  left ventricle has moderately decreased function. The left ventricle  demonstrates  global hypokinesis. Left ventricular diastolic parameters are  indeterminate.   2. Right ventricular systolic function is normal. The right ventricular  size is normal. There is normal pulmonary artery systolic pressure.   3. The mitral valve is normal in structure. No evidence of mitral valve  regurgitation. No evidence of mitral stenosis.   4. The aortic valve has an indeterminant number of cusps. There is mild  calcification of the aortic valve. There is mild thickening of the aortic   valve. Aortic valve regurgitation is not visualized. No aortic stenosis is  present.   5. The inferior vena cava is normal in size with greater than 50%  respiratory variability, suggesting right atrial pressure of 3 mmHg.    Laboratory Data:  High Sensitivity Troponin:   Recent Labs  Lab 04/29/22 1013 04/29/22 1256 05/04/22 0050 05/05/22 0241 05/05/22 0431  TROPONINIHS 53* 44* 42* 73* 80*     Chemistry Recent Labs  Lab 05/01/22 0433 05/04/22 0050 05/04/22 1216 05/05/22 0241  NA 140 135 137 142  K 3.8 3.8 3.4* 4.4  CL 109 105 105 110  CO2 24 20* 24 24  GLUCOSE 155* 304* 97 119*  BUN 42* 42* 43* 41*  CREATININE 1.98* 1.93* 1.76* 1.88*  CALCIUM 8.3* 8.6* 8.4* 8.7*  MG 2.2  --   --   --   GFRNONAA 36* 37* 42* 39*  ANIONGAP 7 10 8 8     Recent Labs  Lab 04/28/22 2252 05/01/22 0433  PROT 6.8 6.5  ALBUMIN 3.4* 3.3*  AST 39 20  ALT 29 24  ALKPHOS 159* 120  BILITOT 1.1 0.6   Lipids No results for input(s): "CHOL", "TRIG", "HDL", "LABVLDL", "LDLCALC", "CHOLHDL" in the last 168 hours.  Hematology Recent Labs  Lab 05/04/22 0804 05/04/22 1216 05/05/22 0241  WBC 11.2* 10.8* 9.9  RBC 4.78 4.56 5.20  HGB 13.5 12.9* 14.7  HCT 42.3 40.5 46.4  MCV 88.5 88.8 89.2  MCH 28.2 28.3 28.3  MCHC 31.9 31.9 31.7  RDW 14.7 14.8 14.9  PLT 296 253 273   Thyroid  Recent Labs  Lab 04/29/22 2147  TSH 0.524    BNP Recent Labs  Lab 04/29/22 0115 04/29/22 1013 05/05/22 0243  BNP 1,693.5* 2,225.0* 2,998.0*    DDimer No results for input(s): "DDIMER" in the last 168 hours.   Radiology/Studies:  DG Chest 2 View  Result Date: 05/05/2022 CLINICAL DATA:  Shortness of breath. EXAM: CHEST - 2 VIEW COMPARISON:  Chest radiograph dated 05/04/2022. FINDINGS: No focal consolidation, pleural effusion, or pneumothorax. The cardiac silhouette is within normal limits. No acute osseous pathology. IMPRESSION: No active cardiopulmonary disease. Electronically Signed   By: Anner Crete  M.D.   On: 05/05/2022 03:55   CT Head Wo Contrast  Result Date: 05/04/2022 CLINICAL DATA:  Head trauma, minor (Age >= 65y) EXAM: CT HEAD WITHOUT CONTRAST TECHNIQUE: Contiguous axial images were obtained from the base of the skull through the vertex without intravenous contrast. RADIATION DOSE REDUCTION: This exam was performed according to the departmental dose-optimization program which includes automated exposure control, adjustment of the mA and/or kV according to patient size and/or use of iterative reconstruction technique. COMPARISON:  04/29/2022 FINDINGS: Brain: There is atrophy and chronic small vessel disease changes. Old left basal ganglia lacunar infarct. No acute intracranial abnormality. Specifically, no hemorrhage, hydrocephalus, mass lesion, acute infarction, or significant intracranial injury. Vascular: No hyperdense vessel or unexpected calcification. Skull: No acute calvarial abnormality. Sinuses/Orbits: No acute findings Other: None IMPRESSION: Old left  basal ganglia lacunar infarct. Atrophy, chronic microvascular disease. No acute intracranial abnormality. Electronically Signed   By: Rolm Baptise M.D.   On: 05/04/2022 12:59   DG Chest Port 1 View  Result Date: 05/04/2022 CLINICAL DATA:  Shortness of breath and. EXAM: PORTABLE CHEST 1 VIEW COMPARISON:  Portable exam 1205 hours compared to 05/04/2022 FINDINGS: Upper normal size of cardiac silhouette. Atherosclerotic calcification aorta. Mediastinal contours and pulmonary vascularity normal. Lungs clear. No acute infiltrate, pleural effusion, or pneumothorax. Osseous demineralization. IMPRESSION: No acute abnormalities. Aortic Atherosclerosis (ICD10-I70.0). Electronically Signed   By: Lavonia Dana M.D.   On: 05/04/2022 12:13   DG Chest Portable 1 View  Result Date: 05/04/2022 CLINICAL DATA:  Shortness of breath EXAM: PORTABLE CHEST 1 VIEW COMPARISON:  Radiographs 04/29/2022 FINDINGS: Stable cardiomediastinal silhouette. Aortic  atherosclerotic calcification. Hyperinflation and chronic bronchitic change. No focal consolidation, pleural effusion, or pneumothorax. No acute osseous abnormality. IMPRESSION: 1. No active disease. 2. COPD. Electronically Signed   By: Placido Sou M.D.   On: 05/04/2022 00:55     Assessment and Plan:   1. Acute HFrEF - He has a known reduced EF of 35-40% and management has been challenging in the setting of medication noncompliance and recently complicated by orthostasis. - BNP elevated at 2998 this admission and he does have evidence of volume overload by examination. Received IV Lasix 40mg  with a net output of -1.3 L thus far. Would dose IV Lasix 40mg  x1 again and assess response. Will be cautious given his recent orthostasis and syncope.  - In regards to GDMT, he has only been taking Coreg 6.25mg  once daily. He was previously noncompliant with BID medications or would take both doses at the same time, therefore he may benefit from this being switched to Toprol-XL 25mg  daily once his respiratory status improves. Could also consider adding Jardiance or [place]. Entresto previously stopped due to orthostasis.   2. CAD/Elevated Troponin Values - Prior cath in 10/2021 showed mild to moderate obstructive CAD along mid-RCA and acute Mrg with medical management recommended. Reviewed with the patient today. - Hs Troponin values have been flat at 42, 73 and 80. Suspect secondary to demand ischemia in the setting of an acute CHF exacerbation.  - Remains on Plavix, Imdur 60mg  daily and Atorvastatin 80mg  daily.   3. HTN - BP elevated at 163/106 on most recent check. Orthostatics pending. Would plan to restart GDMT as discussed above pending repeat vitals.   4. HLD - LDL at 64 on most recent check. Continue Atorvastatin 80mg  daily.   5. Stage 3 CKD - Baseline creatinine 1.7 - 1.8. At 2.20 in 04/2022. Stable at 1.88 today.   6. COPD Exacerbation - Management per the admitting team.  7. Social  Issues - He is currently homeless. PO intake has been variable which has likely been contributing to orthostasis as well. Unsure what outpatient resources would be available to the patient. Recommend Social Work consult as this is likely contributing to his recurrent admissions.   For questions or updates, please contact [place] Please consult [email or web address] for contact info under    Signed, Erma Heritage, PA-C  05/05/2022 8:54 AM   Attending attestation  Patient seen and independently examined with Bernerd Pho, PA-C. We discussed all aspects of the encounter. I agree with the assessment and plan as stated above.  Patient is a 67 year old M known to have mixed cardiomyopathy LVEF 35 to 40% in 04/2022, CAD manifested by ACS in 10/2021 s/p moderate RCA and  OM disease on medical management, carotid artery stenosis s/p TCAR in 01/2021, HTN, COPD, stage III CKD presented to the hospital with a chief complaint of DOE x 6 months but he was admitted to [facility] last week for syncopal episode, was found to be orthostatic and received IV fluids. He is homeless and have social issues with the housing situation. EKG showed NSR, T wave inversions in the inferior and lateral leads, likely repol and unchanged from prior, likely secondary to HTN. He denies any angina. Reds vest showed 30 this morning. Physical examination showed patient not in acute respite distress, nose is disfigured, JVD unable to examine due to beard, S1-S2 heard, no S3, lungs clear, nontender and nondistended abdomen, no pitting edema in bilateral lower extremities. High sensitive troponins were elevated, 42, 73, 80. BNP is elevated at 2998 (chronically high, highest was more than 4500 around 5 months ago).  # Mixed cardiomyopathy LVEF 35 to 40%, compensated:Reds vest is 30 this morning. His cardiomyopathy is likely secondary to poorly controlled HTN. Patient has been noncompliant due to financial barriers, homeless and  has difficulty with housing situation. It is very difficult for the patient to refill his medications. Appreciate case management's input. Resume carvedilol at a low dose, 3.125 mg twice daily. But would probably check orthostatics prior to initiating the dose. # CAD manifested by ACS in 9/23 with moderate RCA and OM disease: Continue DAPT for total duration of 1 year, till 10/2022. Unclear compliance. Continue high intensity statin. # CAS s/p TCAR in 2023: Continue cardio prudent medications.  [facility] will sign off.   Medication Recommendations: Carvedilol 3.125 mg twice daily only if orthostatics are negative for orthostatic hypotension, Imdur 60 mg once daily, aspirin 81 mg once daily, Plavix 75 mg once daily, atorvastatin 80 mg nightly Other recommendations (labs, testing, etc): None Follow up as an outpatient: 1 month upon discharge and cardiology clinic with [facility]  I have spent a total of 60 minutes with patient reviewing chart , telemetry, EKGs, labs and examining patient as well as establishing an assessment and plan that was discussed with the patient.  > 50% of time was spent in direct patient care.    Plummer Matich Fidel Levy, MD [place]  [facility]  12:01 PM

## 2022-05-05 NOTE — ED Notes (Signed)
Lab in room.

## 2022-05-05 NOTE — ED Triage Notes (Signed)
Pt BIB [facility] from local hotel c/o SOB. Pt was here x2 earlier today for same as well.

## 2022-05-05 NOTE — H&P (Signed)
History and Physical    Patient: Michael [name] DOB: 20-Jan-1956 DOA: 05/05/2022 DOS: the patient was seen and examined on 05/05/2022 PCP: [name], MD  Patient coming from: Home  Chief Complaint:  Chief Complaint  Patient presents with   Shortness of Breath   HPI: Michael [name] is a 67 year old male with a history of HFrEF, CKD stage III, coronary disease, COPD, tobacco abuse, basal cell carcinoma status post partial rhinectomy and maxillectomy 06/06/2017 at [facility], advanced colonic tubular adenoma, right carotid stenosis, hypertension, presenting with shortness of breath and syncope.  The patient was recently admitted to the hospital from 04/29/2022 to 05/03/2022 for syncope and COPD exacerbation.  He was discharged with prednisone taper, albuterol.  It was felt his syncope was related to orthostasis.  His Entresto and amlodipine were discontinued during that hospitalization.  Prior to that, the patient was also hospitalized from 04/25/2022 to 04/27/2022 due to elevated troponin.  This was thought to be related to demand ischemia.  He also had some asymptomatic NSVT.  Since his discharge from [facility] on 05/03/2022, the patient presented back to the ED on 05/04/2022 secondary to shortness of breath.  He has had 3 ED visits in the last 24 hours shortness of breath.  He continues to smoke tobacco.  He states that he had another syncopal episode while walking back "home".  He denies any fevers, chills, chest pain, hemoptysis, diarrhea, abdominal pain, dysuria.  He did have 1 episode of emesis yesterday.   In the ED, the patient was afebrile and hemodynamically stable with oxygen saturation 98% on room air.  WBC 9.9, hemoglobin 14.7, platelets 273,000.  BMP showed sodium 142, potassium 4.4, bicarbonate 24, serum creatinine 1.88.  BNP 2998.  CT of the brain was negative for any acute findings.  Chest x-ray showed basilar scarring.  The patient was admitted for further evaluation and  treatment of his syncope and dyspnea.  Review of Systems: As mentioned in the history of present illness. All other systems reviewed and are negative. Past Medical History:  Diagnosis Date   Aortic atherosclerosis 08/03/2021   CAD (coronary artery disease) 08/03/2021   Cardiac catheterization September 2023 with RCA/RV marginal stenosis managed medically   Cardiomyopathy    Carotid artery disease    CKD (chronic kidney disease) stage 3, GFR 30-59 ml/min    COPD (chronic obstructive pulmonary disease)    Essential hypertension    Head and neck cancer 2019   Right facial basal cell carcinoma s/p resection with right partial mastectomy and partal rhinectomy with skin graft (06/13/17)   Iron deficiency anemia    Urinary retention    Past Surgical History:  Procedure Laterality Date   ANKLE CLOSED REDUCTION Right    open reduction   BASAL CELL CARCINOMA EXCISION  2019   at [facility]   BIOPSY  07/19/2021   Procedure: BIOPSY;  Surgeon: Eloise Harman, DO;  [facility];  Service: Endoscopy;;   COLONOSCOPY N/A 05/31/2017   Procedure: COLONOSCOPY;  Surgeon: Daneil Dolin, MD;  [facility];  Service: Endoscopy;  Laterality: N/A;  2:45pm   COLONOSCOPY WITH PROPOFOL N/A 07/19/2021   Procedure: COLONOSCOPY WITH PROPOFOL;  Surgeon: Eloise Harman, DO;  [facility];  Service: Endoscopy;  Laterality: N/A;  3:00pm, moved up to 9:00   CYSTOSCOPY N/A 10/29/2018   Procedure: CYSTOSCOPY, CLOT EVACUATION;  Surgeon: Ceasar Mons, MD;  Location: [facility];  Service: Urology;  Laterality: N/A;  PARTIAL COLECTOMY Right 08/11/2021   Procedure: PARTIAL COLECTOMY, OPEN RIGHT HEMICOLECTOMY;  Surgeon: Virl Cagey, MD;  Location: [facility];  Service: General;  Laterality: Right;   POLYPECTOMY  05/31/2017   Procedure: POLYPECTOMY;  Surgeon: Daneil Dolin, MD;  [facility];  Service: Endoscopy;;   RIGHT/LEFT HEART CATH AND CORONARY ANGIOGRAPHY N/A 10/22/2021    Procedure: RIGHT/LEFT HEART CATH AND CORONARY ANGIOGRAPHY;  Surgeon: Early Osmond, MD;  Location: [place] [facility];  Service: Cardiovascular;  Laterality: N/A;   TONSILLECTOMY     TRANSCAROTID ARTERY REVASCULARIZATION  Right 02/15/2021   Procedure: RIGHT TRANSCAROTID ARTERY REVASCULARIZATION;  Surgeon: [name] Heck, MD;  Location: [place];  Service: Vascular;  Laterality: Right;   ULTRASOUND GUIDANCE FOR VASCULAR ACCESS Left 02/15/2021   Procedure: ULTRASOUND GUIDANCE FOR VASCULAR ACCESS;  Surgeon: [name] Heck, MD;  Location: [place];  Service: Vascular;  Laterality: Left;   XI ROBOTIC ASSISTED SIMPLE PROSTATECTOMY N/A 10/29/2018   Procedure: XI ROBOTIC ASSISTED SIMPLE PROSTATECTOMY;  Surgeon: Cleon Gustin, MD;  Location: [facility];  Service: Urology;  Laterality: N/A;   Social History:  reports that he quit smoking about 10 months ago. His smoking use included cigarettes. He has a 20.00 pack-year smoking history. He has never been exposed to tobacco smoke. He has never used smokeless tobacco. He reports that he does not currently use alcohol. He reports that he does not use drugs.  No Known Allergies  Family History  Problem Relation Age of Onset   Stroke Father    Cirrhosis Mother    Colon cancer Neg Hx     Prior to Admission medications   Medication Sig Start Date End Date Taking? Authorizing Provider  acetaminophen (TYLENOL) 650 MG CR tablet Take 1,300 mg by mouth as needed for pain.    [provider]  albuterol (VENTOLIN HFA) 108 (90 Base) MCG/ACT inhaler Inhale 2 puffs into the lungs every 6 (six) hours as needed for wheezing or shortness of breath. 04/27/22   Shahmehdi, Valeria Batman, MD  atorvastatin (LIPITOR) 80 MG tablet Take 1 tablet (80 mg total) by mouth every evening. 04/27/22   Shahmehdi, Valeria Batman, MD  Budeson-Glycopyrrol-Formoterol (BREZTRI AEROSPHERE) 160-9-4.8 MCG/ACT AERO Inhale 2 puffs into the lungs 2 (two) times daily.    [provider]  carvedilol (COREG) 6.25 MG tablet Take 1 tablet (6.25 mg total) by mouth daily. 05/02/22   Little Ishikawa, MD  clopidogrel (PLAVIX) 75 MG tablet Take 1 tablet (75 mg total) by mouth daily. 04/27/22   Shahmehdi, Valeria Batman, MD  ibuprofen (ADVIL) 200 MG tablet Take 800 mg by mouth as needed for moderate pain.    [provider]  isosorbide mononitrate (IMDUR) 60 MG 24 hr tablet Take 1 tablet (60 mg total) by mouth every evening. 04/27/22   Shahmehdi, Valeria Batman, MD  potassium chloride SA (KLOR-CON M) 20 MEQ tablet Take 1 tablet (20 mEq total) by mouth daily. 05/04/22   Lajean Saver, MD  predniSONE (DELTASONE) 10 MG tablet Take 4 tablets (40 mg total) by mouth daily for 3 days, THEN 3 tablets (30 mg total) daily for 3 days, THEN 2 tablets (20 mg total) daily for 3 days, THEN 1 tablet (10 mg total) daily for 3 days. 05/01/22 05/13/22  Little Ishikawa, MD    Physical Exam: Vitals:   05/05/22 0530 05/05/22 0630 05/05/22 0700 05/05/22 0720  BP: (!) 164/81 (!) 185/90 (!) 152/97   Pulse: 65 66 Marland Kitchen)  59   Resp: 20 19 20    Temp:    97.9 F (36.6 C)  TempSrc:    Oral  SpO2: 99% 98% 98%   Weight:      Height:       GENERAL:  A&O x 3, NAD, well developed, cooperative, follows commands HEENT: [place]/AT, No thrush, No icterus, No oral ulcers Neck:  No neck mass, No meningismus, soft, supple CV: RRR, no S3, no S4, no rub, no JVD Lungs: Bibasal crackles.  No wheezing.  Good air movement Abd: soft/NT +BS, nondistended Ext: trace LE edema, no lymphangitis, no cyanosis, no rashes Neuro:  CN II-XII intact, strength 4/5 in RUE, RLE, strength 4/5 LUE, LLE; sensation intact bilateral; no dysmetria; babinski equivocal  Data Reviewed: Data stated  above history  Assessment and Plan: Dyspnea -Multifactorial including mild COPD exacerbation, HFrEF, and deconditioning -Chest x-ray without any acute infiltrates -Start DuoNebs -Start Pulmicort -Started on IV Solu-Medrol -Check COVID-19 PCR  Elevated  troponin -No chest pain presently -Secondary to demand ischemia -Cardiology consult -04/03/2022 echo EF 35-40%, global HK, normal PASP, mild TR  Syncope -Check orthostatics -Remain on telemetry -Obtain EEG -UDS  Chronic HFrEF -Entresto was discontinued during his last hospitalization -04/30/2022 echo EF 35-40%, global HK, normal PASP, mild TR -Cardiology consulted -Resume carvedilol  CKD stage IIIb -Baseline creatinine 1.7-2.0  Essential hypertension -Restart carvedilol and titrate as needed  Tobacco abuse -Tobacco cessation discussed  Right Carotid stenosis -The patient has follow-up with Dr. Juanda Crumble fields -05/12/2017 carotid ultrasound shows 80-99% right carotid stenosis -70% heavily calcified RICA stenosis and no significant left-sided disease by CT angiography.  He is now following with Dr. Benson Norway was not felt to be a candidate for carotid stenting and suboptimal candidate for endarterectomy. Plan is for medical therapy at this point -continue ASA  Basal cell carcinoma of nasolabial fold -06/06/2017--right partial rhinectomy, resection of left intranasal lesion, skin graft @[facility] -Margins were clear on pathology -Most recent follow-up was 06/23/2017   Iron deficiency anemia/Advanced colonic adenoma -The patient had colonoscopy 05/31/2017--Dr. Rourk -He was noted to have numerous polyps as well as a polypoid mass in the hepatic flexure to the cecum area -pathology of mass--tubular adenoma -He was referred to general surgery, Dr. Blake Divine for hemicolectomy -He has been lost to follow-up -noncompliant with po iron    Advance Care Planning: FULL  Consults: cardiology  Family Communication: none  Severity of Illness: The appropriate patient status for this patient is INPATIENT. Inpatient status is judged to be reasonable and necessary in order to provide the required intensity of service to ensure the patient's safety. The patient's presenting symptoms,  physical exam findings, and initial radiographic and laboratory data in the context of their chronic comorbidities is felt to place them at high risk for further clinical deterioration. Furthermore, it is not anticipated that the patient will be medically stable for discharge from the hospital within 2 midnights of admission.   * I certify that at the point of admission it is my clinical judgment that the patient will require inpatient hospital care spanning beyond 2 midnights from the point of admission due to high intensity of service, high risk for further deterioration and high frequency of surveillance required.*  Author: Orson Eva, MD 05/05/2022 7:49 AM  For on call review [email or web address].

## 2022-05-05 NOTE — Hospital Course (Addendum)
67 year old male with a history of HFrEF, CKD stage III, coronary disease, COPD, tobacco abuse, basal cell carcinoma status post partial rhinectomy and maxillectomy 06/06/2017 at [facility], advanced colonic tubular adenoma, right carotid stenosis, hypertension, presenting with shortness of breath and syncope.  The patient was recently admitted to the hospital from 04/29/2022 to 05/03/2022 for syncope and COPD exacerbation.  He was discharged with prednisone taper, albuterol.  It was felt his syncope was related to orthostasis.  His Entresto and amlodipine were discontinued during that hospitalization.  Prior to that, the patient was also hospitalized from 04/25/2022 to 04/27/2022 due to elevated troponin.  This was thought to be related to demand ischemia.  He also had some asymptomatic NSVT.  Since his discharge from [facility] on 05/03/2022, the patient presented back to the ED on 05/04/2022 secondary to shortness of breath.  He has had 3 ED visits in the last 24 hours shortness of breath.  He continues to smoke tobacco.  He states that he had another syncopal episode while walking back "home".  He denies any fevers, chills, chest pain, hemoptysis, diarrhea, abdominal pain, dysuria.  He did have 1 episode of emesis yesterday.   In the ED, the patient was afebrile and hemodynamically stable with oxygen saturation 98% on room air.  WBC 9.9, hemoglobin 14.7, platelets 273,000.  BMP showed sodium 142, potassium 4.4, bicarbonate 24, serum creatinine 1.88.  BNP 2998.  CT of the brain was negative for any acute findings.  Chest x-ray showed basilar scarring.  The patient was admitted for further evaluation and treatment of his syncope and dyspnea.

## 2022-05-06 DIAGNOSIS — R7989 Other specified abnormal findings of blood chemistry: Secondary | ICD-10-CM | POA: Diagnosis not present

## 2022-05-06 DIAGNOSIS — I10 Essential (primary) hypertension: Secondary | ICD-10-CM | POA: Diagnosis not present

## 2022-05-06 DIAGNOSIS — R0602 Shortness of breath: Secondary | ICD-10-CM | POA: Diagnosis not present

## 2022-05-06 DIAGNOSIS — I5022 Chronic systolic (congestive) heart failure: Secondary | ICD-10-CM | POA: Diagnosis not present

## 2022-05-06 DIAGNOSIS — J441 Chronic obstructive pulmonary disease with (acute) exacerbation: Secondary | ICD-10-CM | POA: Diagnosis not present

## 2022-05-06 LAB — BASIC METABOLIC PANEL
Anion gap: 9 (ref 5–15)
BUN: 44 mg/dL — ABNORMAL HIGH (ref 8–23)
CO2: 28 mmol/L (ref 22–32)
Calcium: 8.4 mg/dL — ABNORMAL LOW (ref 8.9–10.3)
Chloride: 100 mmol/L (ref 98–111)
Creatinine, Ser: 1.97 mg/dL — ABNORMAL HIGH (ref 0.61–1.24)
GFR, Estimated: 37 mL/min — ABNORMAL LOW (ref >60)
Glucose, Bld: 295 mg/dL — ABNORMAL HIGH (ref 70–99)
Potassium: 4.2 mmol/L (ref 3.5–5.1)
Sodium: 137 mmol/L (ref 135–145)

## 2022-05-06 MED ORDER — PREDNISONE 20 MG PO TABS
50.0000 mg | ORAL_TABLET | Freq: Every day | ORAL | Status: DC
  Administered 2022-05-06: 50 mg via ORAL
  Filled 2022-05-06: qty 1

## 2022-05-06 NOTE — Progress Notes (Signed)
Patient had uneventful night, no c/o pain or distress otherwise noted.  Patient used urinal and was assisted to the bathroom with stand-by.

## 2022-05-06 NOTE — Progress Notes (Signed)
[facility] [name] [name], [name] [address]   CLINIC:  Medical Oncology/Hematology  PCP:  Benita StabileHall, John Z, MD [name] [address] [phone]   REASON FOR VISIT:  Follow-up for iron deficiency anemia  CURRENT THERAPY: Intermittent IV iron  INTERVAL HISTORY:   [name] 67 y.o. male returns for routine follow-up of iron deficiency anemia.  He was last seen by [name] PA-C on 01/13/2022.  Since his last visit, patient has had multiple hospitalizations and ED visits related to shortness of breath, chest pain, and syncope.  Additional complicating factors include recent homelessness and refusal to discharge.  At today's visit, he reports feeling ***.   He reports moderate fatigue with energy at about ***%.  He reports that his bowel movements are generally dark, and he denies any obvious bleeding such as bright blood per rectum or gross melena.  *** *** He has positional vertigo occasionally, especially with laying down. *** No pica, restless legs, headaches, chest pain,  lightheadedness, or syncope. *** He has chronic dyspnea on exertion.   ***Patient reports low appetite.  He eats about 2 meals daily, denies any nausea, vomiting, abdominal pain, diarrhea.  He tried drinking Ensure, but "did not love the taste."   Weight today is 148 pounds, which is overall stable compared to his last office visit.    He has ***% energy and ***% appetite. He endorses that he is maintaining a stable weight.   ASSESSMENT & PLAN:  1.    Iron deficiency anemia + anemia of CKD stage IIIb - Combination anemia from CKD, malabsorption and blood loss. - Hemoccult stool was POSITIVE x3 (June 2022) - Most recent colonoscopy (07/19/2021) with villous, fungating, infiltrative and polypoid nonobstructing mass in ascending colon (addressed below); no bleeding was present -- He has never had an EGD - Right hemicolectomy on 08/11/2021, pathology showed  high-grade dysplasia negative for invasive carcinoma - Patient is hesitant to try oral iron due to abdominal side effects*** - He has been on intermittent IV iron infusions.  (Most recent IV Venofer 300 mg x 3 from 09/13/2021 through 09/28/2021) - Most recent labs (01/05/2022): Hgb 10.9/MCV 81.7.  Ferritin 26, iron saturation 6% with elevated TIBC 470.  BMP shows baseline CKD stage IIIb with creatinine 1.65/GFR 45. - Intermittent dark stool and occasional red streaks - Persistent iron deficiency despite IV repletion is suspicious for ongoing occult blood loss, despite colon mass having been removed (right hemicolectomy on 08/11/2021) - PLAN: Recommend additional IV Venofer 300 mg x 4 doses -- Stool cards x 3 - Repeat labs and RTC in 3 months. - Continue GI follow-up - Patient is aware of alarm signs/symptoms that would prompt immediate medical attention   2.  Locally advanced basal cell carcinoma right nasolabial fold: - Biopsy of the right nasal mucosa on 04/05/2017 with basal cell carcinoma. - Biopsy of the left cheek lesion on 04/02/2017 consistent with basal cell carcinoma. - Resection with skin graft on 06/06/2017.  Margin was close.  Rest of the margins were negative. - 10 rounds of radiation by Dr. Basilio CairoSquire for recurrent basal cell carcinoma to the right medial canthus completed on 03/20/2020.    3.  Right colon mass (tubular adenomas with high-grade dysplasia) - PET scan in 2019 (obtained due to basal cell skin cancer) demonstrated area of concern with mass in ascending colon - Colonoscopy in 2019 by Dr. [name] demonstrated multiple polyps with tubular adenoma, and patient was referred  for surgical excision of colon due to volume of polyps and concern for more extensive disease.  Patient was lost to follow-up with GI and did not follow-up with surgeon at this time. - Reestablished care with GI in May 2023, with colonoscopy he underwent colonoscopy  (07/19/2021) showing villous, fungating, infiltrative  and polypoid nonobstructing mass in ascending colon - CT abdomen/pelvis (07/05/2021): Lobular intraluminal mass at: Of hepatic flexure measuring up to 8 cm in length; new pancreatic ductal dilation and atrophy favored to be sequela of chronic pancreatitis but MRI recommended - MRI abdomen (08/06/2021) showed upstream pancreatic atrophy and duct dilation secondary to dominant ductal calculi; no convincing evidence of underlying neoplasm - Due to large size of residual tubular adenoma/right colon mass, patient underwent right hemicolectomy on 08/11/2021.  Surgical pathology showed multiple tubular adenomas, several of which showed focal high-grade dysplasia, but were negative for invasive carcinoma.  - PLAN: Continue follow-up with GI   4.  Vitamin D deficiency - Prior vitamin D (07/31/2021) low at 18.95 - Most recent vitamin D (01/05/2022): Improved and normal at 35.06 - Patient is not sure if he is taking his vitamin D 50,000 units weekly - PLAN: Continue vitamin D ergocalciferol 50,000 units weekly.    PLAN SUMMARY: >> *** >> *** >> ***   [facility] **VISIT SUMMARY & IMPORTANT INSTRUCTIONS **   You were seen today by Rojelio Brenner PA-C for your ***.    *** ***  *** ***  LABS: Return in ***   OTHER TESTS: ***  MEDICATIONS: ***  FOLLOW-UP APPOINTMENT: ***     REVIEW OF SYSTEMS: ***  Review of Systems - Oncology   PHYSICAL EXAM:  ECOG PERFORMANCE STATUS: {CHL ONC ECOG PY:[id number]} *** There were no vitals filed for this visit. There were no vitals filed for this visit. Physical Exam  PAST MEDICAL/SURGICAL HISTORY:  Past Medical History:  Diagnosis Date  . Aortic atherosclerosis 08/03/2021  . CAD (coronary artery disease) 08/03/2021   Cardiac catheterization September 2023 with RCA/RV marginal stenosis managed medically  . Cardiomyopathy   . Carotid artery disease   . CKD (chronic kidney disease) stage 3, GFR 30-59 ml/min   .  COPD (chronic obstructive pulmonary disease)   . Essential hypertension   . Head and neck cancer 2019   Right facial basal cell carcinoma s/p resection with right partial mastectomy and partal rhinectomy with skin graft (06/13/17)  . Iron deficiency anemia   . Urinary retention    Past Surgical History:  Procedure Laterality Date  . ANKLE CLOSED REDUCTION Right    open reduction  . BASAL CELL CARCINOMA EXCISION  2019   at [facility]  . BIOPSY  07/19/2021   Procedure: BIOPSY;  Surgeon: Lanelle Bal, DO;  [facility];  Service: Endoscopy;;  . COLONOSCOPY N/A 05/31/2017   Procedure: COLONOSCOPY;  Surgeon: Corbin Ade, MD;  [facility];  Service: Endoscopy;  Laterality: N/A;  2:45pm  . COLONOSCOPY WITH PROPOFOL N/A 07/19/2021   Procedure: COLONOSCOPY WITH PROPOFOL;  Surgeon: Lanelle Bal, DO;  [facility];  Service: Endoscopy;  Laterality: N/A;  3:00pm, moved up to 9:00  . CYSTOSCOPY N/A 10/29/2018   Procedure: Skip Mayer;  Surgeon: Rene Paci, MD;  Location: [facility] ORS;  Service: Urology;  Laterality: N/A;  . PARTIAL COLECTOMY Right 08/11/2021   Procedure: PARTIAL COLECTOMY, OPEN RIGHT HEMICOLECTOMY;  Surgeon: Lucretia Roers, MD;  Location: [facility] ORS;  Service: General;  Laterality: Right;  . POLYPECTOMY  05/31/2017   Procedure: POLYPECTOMY;  Surgeon: Corbin Ade, MD;  [facility];  Service: Endoscopy;;  . RIGHT/LEFT HEART CATH AND CORONARY ANGIOGRAPHY N/A 10/22/2021   Procedure: RIGHT/LEFT HEART CATH AND CORONARY ANGIOGRAPHY;  Surgeon: Orbie Pyo, MD;  Location: [facility] INVASIVE CV LAB;  Service: Cardiovascular;  Laterality: N/A;  . TONSILLECTOMY    . TRANSCAROTID ARTERY REVASCULARIZATION  Right 02/15/2021   Procedure: RIGHT TRANSCAROTID ARTERY REVASCULARIZATION;  Surgeon: Cephus Shelling, MD;  Location: [facility] OR;  Service: Vascular;  Laterality: Right;  . ULTRASOUND GUIDANCE FOR VASCULAR ACCESS Left  02/15/2021   Procedure: ULTRASOUND GUIDANCE FOR VASCULAR ACCESS;  Surgeon: Cephus Shelling, MD;  Location: [facility] OR;  Service: Vascular;  Laterality: Left;  . XI ROBOTIC ASSISTED SIMPLE PROSTATECTOMY N/A 10/29/2018   Procedure: XI ROBOTIC ASSISTED SIMPLE PROSTATECTOMY;  Surgeon: Malen Gauze, MD;  Location: [facility];  Service: Urology;  Laterality: N/A;    SOCIAL HISTORY:  Social History   Socioeconomic History  . Marital status: Single    Spouse name: Not on file  . Number of children: Not on file  . Years of education: Not on file  . Highest education level: Not on file  Occupational History  . Not on file  Tobacco Use  . Smoking status: Former    Packs/day: 0.50    Years: 40.00    Additional pack years: 0.00    Total pack years: 20.00    Types: Cigarettes    Quit date: 07/02/2021    Years since quitting: 0.8    Passive exposure: Never  . Smokeless tobacco: Never  . Tobacco comments:    Quit on 06/09/21  Vaping Use  . Vaping Use: Never used  Substance and Sexual Activity  . Alcohol use: Not Currently  . Drug use: Never  . Sexual activity: Not Currently  Other Topics Concern  . Not on file  Social History Narrative  . Not on file   Social Determinants of Health   Financial Resource Strain: Low Risk  (12/11/2019)   Overall Financial Resource Strain (CARDIA)   . Difficulty of Paying Living Expenses: Not hard at all  Food Insecurity: No Food Insecurity (05/05/2022)   Hunger Vital Sign   . Worried About Programme researcher, broadcasting/film/video in the Last Year: Never true   . Ran Out of Food in the Last Year: Never true  Transportation Needs: Unmet Transportation Needs (05/05/2022)   PRAPARE - Transportation   . Lack of Transportation (Medical): Yes   . Lack of Transportation (Non-Medical): Yes  Physical Activity: Inactive (12/11/2019)   Exercise Vital Sign   . Days of Exercise per Week: 0 days   . Minutes of Exercise per Session: 0 min  Stress: No Stress Concern Present (12/11/2019)    [employer] of Occupational Health - Occupational Stress Questionnaire   . Feeling of Stress : Not at all  Social Connections: Socially Isolated (12/11/2019)   Social Connection and Isolation Panel [NHANES]   . Frequency of Communication with Friends and Family: Never   . Frequency of Social Gatherings with Friends and Family: Never   . Attends Religious Services: Never   . Active Member of Clubs or Organizations: No   . Attends Banker Meetings: Never   . Marital Status: Divorced  Catering manager Violence: Not At Risk (05/05/2022)   Humiliation, Afraid, Rape, and Kick questionnaire   . Fear of Current or Ex-Partner: No   .  Emotionally Abused: No   . Physically Abused: No   . Sexually Abused: No    FAMILY HISTORY:  Family History  Problem Relation Age of Onset  . Stroke Father   . Cirrhosis Mother   . Colon cancer Neg Hx     CURRENT MEDICATIONS:  Outpatient Encounter Medications as of 05/09/2022  Medication Sig Note  . acetaminophen (TYLENOL) 650 MG CR tablet Take 1,300 mg by mouth as needed for pain.   Marland Kitchen albuterol (VENTOLIN HFA) 108 (90 Base) MCG/ACT inhaler Inhale 2 puffs into the lungs every 6 (six) hours as needed for wheezing or shortness of breath.   Marland Kitchen atorvastatin (LIPITOR) 80 MG tablet Take 1 tablet (80 mg total) by mouth every evening.   . Budeson-Glycopyrrol-Formoterol (BREZTRI AEROSPHERE) 160-9-4.8 MCG/ACT AERO Inhale 2 puffs into the lungs 2 (two) times daily. 04/29/2022: Pt stated he has an overstock of this medication at home.   . clopidogrel (PLAVIX) 75 MG tablet Take 1 tablet (75 mg total) by mouth daily.   . isosorbide mononitrate (IMDUR) 60 MG 24 hr tablet Take 1 tablet (60 mg total) by mouth every evening.   . predniSONE (DELTASONE) 10 MG tablet Take 4 tablets (40 mg total) by mouth daily for 3 days, THEN 3 tablets (30 mg total) daily for 3 days, THEN 2 tablets (20 mg total) daily for 3 days, THEN 1 tablet (10 mg total) daily for 3 days.    No  facility-administered encounter medications on file as of 05/09/2022.    ALLERGIES:  No Known Allergies  LABORATORY DATA:  I have reviewed the labs as listed.  CBC    Component Value Date/Time   WBC 9.9 05/05/2022 0241   RBC 5.20 05/05/2022 0241   HGB 14.7 05/05/2022 0241   HGB 13.8 04/23/2011 0919   HCT 46.4 05/05/2022 0241   HCT 26.0 (L) 05/09/2018 0455   PLT 273 05/05/2022 0241   PLT 208 04/23/2011 0919   MCV 89.2 05/05/2022 0241   MCV 84 04/23/2011 0919   MCH 28.3 05/05/2022 0241   MCHC 31.7 05/05/2022 0241   RDW 14.9 05/05/2022 0241   RDW 12.3 04/23/2011 0919   LYMPHSABS 2.2 05/04/2022 0804   LYMPHSABS 1.1 04/23/2011 0919   MONOABS 1.0 05/04/2022 0804   MONOABS 0.9 (H) 04/23/2011 0919   EOSABS 0.0 05/04/2022 0804   EOSABS 0.0 04/23/2011 0919   BASOSABS 0.0 05/04/2022 0804   BASOSABS 0.0 04/23/2011 0919      Latest Ref Rng & Units 05/06/2022    4:03 AM 05/05/2022    2:41 AM 05/04/2022   12:16 PM  CMP  Glucose 70 - 99 mg/dL 510  258  97   BUN 8 - 23 mg/dL 44  41  43   Creatinine 0.61 - 1.24 mg/dL 5.27  7.82  4.23   Sodium 135 - 145 mmol/L 137  142  137   Potassium 3.5 - 5.1 mmol/L 4.2  4.4  3.4   Chloride 98 - 111 mmol/L 100  110  105   CO2 22 - 32 mmol/L 28  24  24    Calcium 8.9 - 10.3 mg/dL 8.4  8.7  8.4     DIAGNOSTIC IMAGING:  I have independently reviewed the relevant imaging and discussed with the patient.   WRAP UP:  All questions were answered. The patient knows to call the clinic with any problems, questions or concerns.  Medical decision making: ***  Time spent on visit: I spent *** minutes counseling the  patient face to face. The total time spent in the appointment was *** minutes and more than 50% was on counseling.  Carnella Guadalajara, PA-C  ***

## 2022-05-06 NOTE — Progress Notes (Signed)
PT has stated that he is homeless and has no where to go after discharge. MD and social workers aware. Resources have been given. Pt has stated that he is trying to get in touch with some homeless shelters.

## 2022-05-06 NOTE — Progress Notes (Signed)
Pt was unable to get in touch with a homeless shelter at this time. Pt does not have a phone nor a debit card. Social work and MD are both aware as I made concerns known about pt being homeless and having no options . Pt was taken to the main entrance and discharged to walk.

## 2022-05-06 NOTE — Care Management Obs Status (Signed)
MEDICARE OBSERVATION STATUS NOTIFICATION   Patient Details  Name: Michael [name] MRN: [id number] Date of Birth: 02-Jul-1955   Medicare Observation Status Notification Given:  Yes    Annice Needy, LCSW 05/06/2022, 9:35 AM

## 2022-05-06 NOTE — Care Management CC44 (Signed)
Condition Code 44 Documentation Completed  Patient Details  Name: Michael [name] MRN: [id number] Date of Birth: [birth year]   Condition Code 44 given:  Yes Patient signature on Condition Code 44 notice:  Yes Documentation of 2 MD's agreement:  Yes Code 44 added to claim:  Yes    Annice Needy, LCSW 05/06/2022, 9:35 AM

## 2022-05-06 NOTE — TOC Initial Note (Signed)
Transition of Care [facility]) - Initial/Assessment Note    Patient Details  Name: Michael [name] MRN: [id number] Date of Birth: January 21, 1956  [facility]) CM/SW Contact:    Annice Needy, LCSW Phone Number: 05/06/2022, 10:54 AM  Clinical Narrative:                 Patient provided two listing of homeless and transitional shelters, CODE 44 paperwork and a Recruitment consultant. Patient stated that he does not have a phone but would use the hospital phone to contact shelters for availability. He indicated that he has been trying to contact his son to no avail. TOC unsuccessfully tried to contact his son.   Expected Discharge Plan: Homeless Shelter Barriers to Discharge: No Barriers Identified   Patient Goals and CMS Choice            Expected Discharge Plan and Services         Expected Discharge Date: 05/06/22                                    Prior Living Arrangements/Services                       Activities of Daily Living Home Assistive Devices/Equipment: None ADL Screening (condition at time of admission) Patient's cognitive ability adequate to safely complete daily activities?: Yes Is the patient deaf or have difficulty hearing?: No Does the patient have difficulty seeing, even when wearing glasses/contacts?: No Does the patient have difficulty concentrating, remembering, or making decisions?: No Patient able to express need for assistance with ADLs?: Yes Does the patient have difficulty dressing or bathing?: No Independently performs ADLs?: Yes (appropriate for developmental age) Does the patient have difficulty walking or climbing stairs?: Yes Weakness of Legs: Both Weakness of Arms/Hands: None  Permission Sought/Granted                  Emotional Assessment              Admission diagnosis:  Acute systolic congestive heart failure [I50.21] Acute on chronic systolic CHF (congestive heart failure) [I50.23] COPD  exacerbation [J44.1] Patient Active Problem List   Diagnosis Date Noted   COPD exacerbation 05/06/2022   Acute on chronic systolic CHF (congestive heart failure) 05/05/2022   Troponin level elevated 04/29/2022   Elevated brain natriuretic peptide (BNP) level 04/29/2022   Syncope 04/29/2022   Pulmonary nodule 04/26/2022   Lightheadedness    Angina at rest    Hypertensive urgency    Transaminitis 10/23/2021   Pressure injury of skin 10/22/2021   Type 2 MI (myocardial infarction) 10/20/2021   Dilation of pancreatic duct 08/11/2021   Colonic mass 08/06/2021   Preoperative cardiovascular examination 08/03/2021   CAD (coronary artery disease) 08/03/2021   Chronic kidney disease, stage 3b 08/03/2021   Aortic atherosclerosis 08/03/2021   Rectal bleeding 06/16/2021   H/O adenomatous polyp of colon 06/16/2021   Carotid stenosis, asymptomatic, right 02/15/2021   Basal cell carcinoma of canthus, right 12/06/2019   Incisional hernia, without obstruction or gangrene 11/15/2019   Elevated PSA 06/19/2019   Benign prostatic hyperplasia with urinary obstruction 10/29/2018   CAP (community acquired pneumonia) 05/09/2018   Chronic HFrEF (heart failure with reduced ejection fraction) 05/09/2018   Lobar pneumonia 05/09/2018   Urinary retention 11/27/2017   Abnormal nuclear stress test 11/22/2017   Complicated UTI (urinary tract infection)  Sepsis 09/17/2017   Vasovagal syncope 09/17/2017   Sepsis secondary to UTI 09/01/2017   Syncope and collapse 07/05/2017   COPD with acute exacerbation 07/05/2017   Acute renal failure superimposed on chronic kidney disease 07/05/2017   Catheter-associated urinary tract infection 07/05/2017   Leukocytosis 07/05/2017   Urinary retention with incomplete bladder emptying 07/05/2017   UTI (urinary tract infection) 07/05/2017   Diarrhea 07/05/2017   Carotid artery disease 06/25/2017   AKI (acute kidney injury)    Orthostatic syncope    Syncope due to  orthostatic hypotension 06/24/2017   Heart murmur 06/24/2017   Abnormal EKG 06/24/2017   Abnormal PET scan of colon 05/25/2017   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 05/15/2017   Periapical abscess 04/04/2017   Iron (Fe) deficiency anemia 04/04/2017   Essential hypertension 04/02/2017   Anemia 04/02/2017   Acute hypoxemic respiratory failure 04/02/2017   Tobacco abuse 04/02/2017   HFmrEF (heart failure with mildly reduced EF) 04/02/2017   Elevated troponin 04/02/2017   Thrombocytosis 04/02/2017   Hypokalemia 04/02/2017   Skin lesion of face    Nasal lesion    PCP:  Benita Stabile, MD Pharmacy:   [facility] [place], [place] - [address] [place] [address] Phone: [phone] Fax: [phone]     Social Determinants of Health (SDOH) Social History: SDOH Screenings   Food Insecurity: No Food Insecurity (05/05/2022)  Housing: Medium Risk (05/05/2022)  Transportation Needs: Unmet Transportation Needs (05/05/2022)  Utilities: Not At Risk (05/05/2022)  Alcohol Screen: Low Risk  (12/11/2019)  Depression (PHQ2-9): Low Risk  (12/11/2019)  Financial Resource Strain: Low Risk  (12/11/2019)  Physical Activity: Inactive (12/11/2019)  Social Connections: Socially Isolated (12/11/2019)  Stress: No Stress Concern Present (12/11/2019)  Tobacco Use: Medium Risk (05/05/2022)   SDOH Interventions: Housing Interventions: Inpatient TOC   Readmission Risk Interventions    04/26/2022   11:46 AM 10/26/2021    2:57 PM 02/18/2021   11:33 AM  Readmission Risk Prevention Plan  Transportation Screening Complete Complete Complete  PCP or Specialist Appt within 5-7 Days   Complete  PCP or Specialist Appt within 3-5 Days  Complete   Home Care Screening   Complete  Medication Review (RN CM)   Complete  HRI or Home Care Consult Complete Complete   Social Work Consult for Recovery Care Planning/Counseling Complete Complete   Palliative Care Screening Not  Applicable Not Applicable   Medication Review Oceanographer) Complete Complete

## 2022-05-06 NOTE — Discharge Summary (Addendum)
Physician Discharge Summary   Patient: Michael [name] MRN: [id number] DOB: 12-May-1955  Admit date:     05/05/2022  Discharge date: 05/06/22  Discharge Physician: Onalee Hua Apphia Cropley   PCP: Benita Stabile, MD   Recommendations at discharge:   Please follow up with primary care provider within 1-2 weeks  Please repeat BMP and CBC in one week    Hospital Course: 67 year old male with a history of HFrEF, CKD stage III, coronary disease, COPD, tobacco abuse, basal cell carcinoma status post partial rhinectomy and maxillectomy 06/06/2017 at [facility], advanced colonic tubular adenoma, right carotid stenosis, hypertension, presenting with shortness of breath and syncope.  The patient was recently admitted to the hospital from 04/29/2022 to 05/03/2022 for syncope and COPD exacerbation.  He was discharged with prednisone taper, albuterol.  It was felt his syncope was related to orthostasis.  His Entresto and amlodipine were discontinued during that hospitalization.  Prior to that, the patient was also hospitalized from 04/25/2022 to 04/27/2022 due to elevated troponin.  This was thought to be related to demand ischemia.  He also had some asymptomatic NSVT.  Since his discharge from [facility] on 05/03/2022, the patient presented back to the ED on 05/04/2022 secondary to shortness of breath.  He has had 3 ED visits in the last 24 hours shortness of breath.  He continues to smoke tobacco.  He states that he had another syncopal episode while walking back "home".  He denies any fevers, chills, chest pain, hemoptysis, diarrhea, abdominal pain, dysuria.  He did have 1 episode of emesis yesterday.   In the ED, the patient was afebrile and hemodynamically stable with oxygen saturation 98% on room air.  WBC 9.9, hemoglobin 14.7, platelets 273,000.  BMP showed sodium 142, potassium 4.4, bicarbonate 24, serum creatinine 1.88.  BNP 2998.  CT of the brain was negative for any acute findings.  Chest x-ray showed basilar scarring.  The  patient was admitted for further evaluation and treatment of his syncope and dyspnea.  Assessment and Plan: Dyspnea -Multifactorial including mild COPD exacerbation, HFrEF, and deconditioning -Chest x-ray without any acute infiltrates -Started DuoNebs -Started Pulmicort -Started on IV Solu-Medrol--d/c with prednisone (pt already had rx from prior d/c on 05/03/22 -4/5 ambulatory pulse ox>>saturation remained at 98-99% on RA   Elevated troponin -No chest pain presently -Secondary to demand ischemia -Cardiology consult appreciated--no further inpt workup -04/03/2022 echo EF 35-40%, global HK, normal PASP, mild TR   Syncope -due to orthostasis -Check orthostatics--SBP dropped from 171/90 to 146/81 on day of d/c -hold off on restarting coreg per cardiology recommendations -Remain on telemetry -UDS--neg   Chronic HFrEF -Sherryll Burger was discontinued during his last hospitalization -04/30/2022 echo EF 35-40%, global HK, normal PASP, mild TR -Cardiology consulted -Resume carvedilol initially, but discontinued due to persistent orthostasis   CKD stage IIIb -Baseline creatinine 1.7-2.0 -serum creatinine 1.97 on day of dc   Essential hypertension -holding coreg as discussed above   Tobacco abuse -Tobacco cessation discussed   Right Carotid stenosis -The patient has follow-up with Dr. Leonette Most fields -05/12/2017 carotid ultrasound shows 80-99% right carotid stenosis -70% heavily calcified RICA stenosis and no significant left-sided disease by CT angiography.  He is now following with Dr. Olga Coaster was not felt to be a candidate for carotid stenting and suboptimal candidate for endarterectomy. Plan is for medical therapy at this point -continue ASA   Basal cell carcinoma of nasolabial fold -06/06/2017--right partial rhinectomy, resection of left intranasal lesion, skin graft -[facility] -Margins were clear on  pathology -Most recent follow-up was 06/23/2017   Iron deficiency anemia/Advanced  colonic adenoma -The patient had colonoscopy 05/31/2017--Dr. Rourk -He was noted to have numerous polyps as well as a polypoid mass in the hepatic flexure to the cecum area -pathology of mass--tubular adenoma -He was referred to general surgery, Dr. Larae GroomsLindsey Bridges for hemicolectomy -He has been lost to follow-up -noncompliant with po iron       Consultants: cardiology Procedures performed: none  Disposition: Home Diet recommendation:  Cardiac diet DISCHARGE MEDICATION: Allergies as of 05/06/2022   No Known Allergies      Medication List     STOP taking these medications    carvedilol 6.25 MG tablet Commonly known as: COREG   ibuprofen 200 MG tablet Commonly known as: ADVIL   potassium chloride SA 20 MEQ tablet Commonly known as: KLOR-CON M       TAKE these medications    acetaminophen 650 MG CR tablet Commonly known as: TYLENOL Take 1,300 mg by mouth as needed for pain.   albuterol 108 (90 Base) MCG/ACT inhaler Commonly known as: VENTOLIN HFA Inhale 2 puffs into the lungs every 6 (six) hours as needed for wheezing or shortness of breath.   atorvastatin 80 MG tablet Commonly known as: LIPITOR Take 1 tablet (80 mg total) by mouth every evening.   Breztri Aerosphere 160-9-4.8 MCG/ACT Aero Generic drug: Budeson-Glycopyrrol-Formoterol Inhale 2 puffs into the lungs 2 (two) times daily.   clopidogrel 75 MG tablet Commonly known as: PLAVIX Take 1 tablet (75 mg total) by mouth daily.   isosorbide mononitrate 60 MG 24 hr tablet Commonly known as: IMDUR Take 1 tablet (60 mg total) by mouth every evening.   predniSONE 10 MG tablet Commonly known as: DELTASONE Take 4 tablets (40 mg total) by mouth daily for 3 days, THEN 3 tablets (30 mg total) daily for 3 days, THEN 2 tablets (20 mg total) daily for 3 days, THEN 1 tablet (10 mg total) daily for 3 days. Start taking on: May 01, 2022        Discharge Exam: Ceasar MonsFiled Weights   05/05/22 0808 05/06/22 0020  05/06/22 0500  Weight: 67.5 kg 69.8 kg 67.7 kg   HEENT:  [place]/AT, No thrush, no icterus CV:  RRR, no rub, no S3, no S4 Lung:  bibasilar rales.  Diminished BS Abd:  soft/+BS, NT Ext:  No edema, no lymphangitis, no synovitis, no rash   Condition at discharge: stable  The results of significant diagnostics from this hospitalization (including imaging, microbiology, ancillary and laboratory) are listed below for reference.   Imaging Studies: DG Chest 2 View  Result Date: 05/05/2022 CLINICAL DATA:  Shortness of breath. EXAM: CHEST - 2 VIEW COMPARISON:  Chest radiograph dated 05/04/2022. FINDINGS: No focal consolidation, pleural effusion, or pneumothorax. The cardiac silhouette is within normal limits. No acute osseous pathology. IMPRESSION: No active cardiopulmonary disease. Electronically Signed   By: Elgie CollardArash  Radparvar M.D.   On: 05/05/2022 03:55   CT Head Wo Contrast  Result Date: 05/04/2022 CLINICAL DATA:  Head trauma, minor (Age >= 65y) EXAM: CT HEAD WITHOUT CONTRAST TECHNIQUE: Contiguous axial images were obtained from the base of the skull through the vertex without intravenous contrast. RADIATION DOSE REDUCTION: This exam was performed according to the departmental dose-optimization program which includes automated exposure control, adjustment of the mA and/or kV according to patient size and/or use of iterative reconstruction technique. COMPARISON:  04/29/2022 FINDINGS: Brain: There is atrophy and chronic small vessel disease changes. Old left basal ganglia  lacunar infarct. No acute intracranial abnormality. Specifically, no hemorrhage, hydrocephalus, mass lesion, acute infarction, or significant intracranial injury. Vascular: No hyperdense vessel or unexpected calcification. Skull: No acute calvarial abnormality. Sinuses/Orbits: No acute findings Other: None IMPRESSION: Old left basal ganglia lacunar infarct. Atrophy, chronic microvascular disease. No acute intracranial abnormality.  Electronically Signed   By: Charlett Nose M.D.   On: 05/04/2022 12:59   DG Chest Port 1 View  Result Date: 05/04/2022 CLINICAL DATA:  Shortness of breath and. EXAM: PORTABLE CHEST 1 VIEW COMPARISON:  Portable exam 1205 hours compared to 05/04/2022 FINDINGS: Upper normal size of cardiac silhouette. Atherosclerotic calcification aorta. Mediastinal contours and pulmonary vascularity normal. Lungs clear. No acute infiltrate, pleural effusion, or pneumothorax. Osseous demineralization. IMPRESSION: No acute abnormalities. Aortic Atherosclerosis (ICD10-I70.0). Electronically Signed   By: Ulyses Southward M.D.   On: 05/04/2022 12:13   DG Chest Portable 1 View  Result Date: 05/04/2022 CLINICAL DATA:  Shortness of breath EXAM: PORTABLE CHEST 1 VIEW COMPARISON:  Radiographs 04/29/2022 FINDINGS: Stable cardiomediastinal silhouette. Aortic atherosclerotic calcification. Hyperinflation and chronic bronchitic change. No focal consolidation, pleural effusion, or pneumothorax. No acute osseous abnormality. IMPRESSION: 1. No active disease. 2. COPD. Electronically Signed   By: Minerva Fester M.D.   On: 05/04/2022 00:55   ECHOCARDIOGRAM COMPLETE  Result Date: 04/30/2022    ECHOCARDIOGRAM REPORT   Patient Name:   PILOT PRINDLE Date of Exam: 04/30/2022 Medical Rec #:  [id number]     Height:       72.0 in Accession #:    [id number]    Weight:       145.5 lb Date of Birth:  1955-06-28     BSA:          1.861 m Patient Age:    75 years      BP:           121/64 mmHg Patient Gender: M             HR:           64 bpm. Exam Location:  Inpatient Procedure: 2D Echo, Cardiac Doppler and Color Doppler Indications:    CHF  History:        Patient has prior history of Echocardiogram examinations, most                 recent 10/21/2021. CAD, COPD, Signs/Symptoms:Syncope; Risk                 Factors:Hypertension and Current Smoker.  Sonographer:    Lucy Antigua Referring Phys: [id number] Daylene Katayama GHERGHE IMPRESSIONS  1. Left ventricular ejection  fraction, by estimation, is 35 to 40%. The left ventricle has moderately decreased function. The left ventricle demonstrates global hypokinesis. Left ventricular diastolic parameters are indeterminate.  2. Right ventricular systolic function is normal. The right ventricular size is normal. There is normal pulmonary artery systolic pressure.  3. The mitral valve is normal in structure. No evidence of mitral valve regurgitation. No evidence of mitral stenosis.  4. The aortic valve has an indeterminant number of cusps. There is mild calcification of the aortic valve. There is mild thickening of the aortic valve. Aortic valve regurgitation is not visualized. No aortic stenosis is present.  5. The inferior vena cava is normal in size with greater than 50% respiratory variability, suggesting right atrial pressure of 3 mmHg. FINDINGS  Left Ventricle: Left ventricular ejection fraction, by estimation, is 35 to 40%. The left ventricle has moderately decreased function. The left ventricle demonstrates  global hypokinesis. The left ventricular internal cavity size was normal in size. There is no left ventricular hypertrophy. Left ventricular diastolic parameters are indeterminate. Right Ventricle: The right ventricular size is normal. Right vetricular wall thickness was not well visualized. Right ventricular systolic function is normal. There is normal pulmonary artery systolic pressure. The tricuspid regurgitant velocity is 2.48 m/s, and with an assumed right atrial pressure of 3 mmHg, the estimated right ventricular systolic pressure is 27.6 mmHg. Left Atrium: Left atrial size was normal in size. Right Atrium: Right atrial size was normal in size. Pericardium: There is no evidence of pericardial effusion. Mitral Valve: The mitral valve is normal in structure. No evidence of mitral valve regurgitation. No evidence of mitral valve stenosis. Tricuspid Valve: The tricuspid valve is normal in structure. Tricuspid valve  regurgitation is mild . No evidence of tricuspid stenosis. Aortic Valve: The aortic valve has an indeterminant number of cusps. There is mild calcification of the aortic valve. There is mild thickening of the aortic valve. There is mild aortic valve annular calcification. Aortic valve regurgitation is not visualized. No aortic stenosis is present. Aortic valve mean gradient measures 4.0 mmHg. Aortic valve peak gradient measures 7.6 mmHg. Aortic valve area, by VTI measures 2.94 cm. Pulmonic Valve: The pulmonic valve was not well visualized. Pulmonic valve regurgitation is not visualized. No evidence of pulmonic stenosis. Aorta: The aortic root is normal in size and structure. Venous: The inferior vena cava is normal in size with greater than 50% respiratory variability, suggesting right atrial pressure of 3 mmHg. IAS/Shunts: No atrial level shunt detected by color flow Doppler.  LEFT VENTRICLE PLAX 2D LVIDd:         5.60 cm      Diastology LVIDs:         5.35 cm      LV e' medial:    3.48 cm/s LV PW:         1.10 cm      LV E/e' medial:  27.1 LV IVS:        1.05 cm      LV e' lateral:   9.25 cm/s LVOT diam:     2.20 cm      LV E/e' lateral: 10.2 LV SV:         93 LV SV Index:   50 LVOT Area:     3.80 cm  LV Volumes (MOD) LV vol d, MOD A4C: 149.0 ml LV vol s, MOD A4C: 116.0 ml LV SV MOD A4C:     149.0 ml RIGHT VENTRICLE RV S prime:     7.72 cm/s TAPSE (M-mode): 2.7 cm LEFT ATRIUM           Index        RIGHT ATRIUM           Index LA Vol (A2C): 66.3 ml 35.62 ml/m  RA Area:     20.20 cm LA Vol (A4C): 53.4 ml 28.69 ml/m  RA Volume:   55.50 ml  29.82 ml/m  AORTIC VALVE AV Area (Vmax):    3.25 cm AV Area (Vmean):   2.88 cm AV Area (VTI):     2.94 cm AV Vmax:           138.00 cm/s AV Vmean:          93.300 cm/s AV VTI:            0.317 m AV Peak Grad:      7.6 mmHg AV Mean Grad:  4.0 mmHg LVOT Vmax:         118.00 cm/s LVOT Vmean:        70.600 cm/s LVOT VTI:          0.245 m LVOT/AV VTI ratio: 0.77  AORTA Ao  Root diam: 3.30 cm Ao Asc diam:  3.40 cm MITRAL VALVE               TRICUSPID VALVE MV Area (PHT): 2.10 cm    TR Peak grad:   24.6 mmHg MV Decel Time: 361 msec    TR Vmax:        248.00 cm/s MV E velocity: 94.30 cm/s MV A velocity: 94.30 cm/s  SHUNTS MV E/A ratio:  1.00        Systemic VTI:  0.24 m                            Systemic Diam: 2.20 cm Dina Rich MD Electronically signed by Dina Rich MD Signature Date/Time: 04/30/2022/10:09:53 AM    Final    CT Head Wo Contrast  Result Date: 04/29/2022 CLINICAL DATA:  Trauma EXAM: CT HEAD WITHOUT CONTRAST TECHNIQUE: Contiguous axial images were obtained from the base of the skull through the vertex without intravenous contrast. RADIATION DOSE REDUCTION: This exam was performed according to the departmental dose-optimization program which includes automated exposure control, adjustment of the mA and/or kV according to patient size and/or use of iterative reconstruction technique. COMPARISON:  CT Head 12/17/19 FINDINGS: Brain: No evidence of acute infarction, hemorrhage, hydrocephalus, extra-axial collection or mass lesion/mass effect. There is sequela of severe chronic microvascular ischemic change. Chronic infarcts in the left basal ganglia. Vascular: No hyperdense vessel or unexpected calcification. Skull: Postsurgical changes prior complex maxillofacial reconstruction. Sinuses/Orbits: Middle ear or mastoid. Paranasal sinuses are clear. There postsurgical changes from a prior orbital floor fracture repair. Orbits are otherwise unremarkable. Other: Likely an epidermal inclusion cyst along the left parietal scalp. IMPRESSION: 1. No acute intracranial abnormality. 2. Sequela of severe chronic microvascular ischemic change. Electronically Signed   By: Lorenza Cambridge M.D.   On: 04/29/2022 13:22   DG Chest 2 View  Result Date: 04/29/2022 CLINICAL DATA:  Shortness of breath. EXAM: CHEST - 2 VIEW COMPARISON:  April 28, 2022. FINDINGS: Stable cardiomediastinal  silhouette. Hyperexpansion of the lungs is noted. No acute pulmonary disease is noted. Bony thorax is unremarkable. IMPRESSION: Hyperexpansion of the lungs. No acute cardiopulmonary abnormality seen. Aortic Atherosclerosis (ICD10-I70.0). Electronically Signed   By: Lupita Raider M.D.   On: 04/29/2022 12:26   DG Chest 2 View  Result Date: 04/28/2022 CLINICAL DATA:  cough, SOB EXAM: CHEST - 2 VIEW COMPARISON:  Chest x-ray 04/28/2022, CT chest 04/25/2022 FINDINGS: The heart and mediastinal contours are unchanged. Aortic calcification. Chronic right Base subcentimeter calcification likely granuloma. No focal consolidation. No pulmonary edema. No pleural effusion. No pneumothorax. No acute osseous abnormality. IMPRESSION: 1. No active cardiopulmonary disease. 2.  Aortic Atherosclerosis (ICD10-I70.0). Electronically Signed   By: Tish Frederickson M.D.   On: 04/28/2022 23:23   DG Chest Port 1 View  Result Date: 04/28/2022 CLINICAL DATA:  Shortness of breath. EXAM: PORTABLE CHEST 1 VIEW COMPARISON:  04/25/2018 for FINDINGS: The cardiac silhouette, mediastinal and contours are within normal limits given the AP projection and portable technique. Stable underlying emphysematous changes and pulmonary scarring. No acute overlying pulmonary process. No pleural effusion or pneumothorax. IMPRESSION: Emphysematous changes and pulmonary scarring but no acute overlying  pulmonary process. Electronically Signed   By: Rudie Meyer M.D.   On: 04/28/2022 14:58   CT Angio Chest PE W and/or Wo Contrast  Result Date: 04/25/2022 CLINICAL DATA:  Shortness of breath EXAM: CT ANGIOGRAPHY CHEST WITH CONTRAST TECHNIQUE: Multidetector CT imaging of the chest was performed using the standard protocol during bolus administration of intravenous contrast. Multiplanar CT image reconstructions and MIPs were obtained to evaluate the vascular anatomy. RADIATION DOSE REDUCTION: This exam was performed according to the departmental  dose-optimization program which includes automated exposure control, adjustment of the mA and/or kV according to patient size and/or use of iterative reconstruction technique. CONTRAST:  64mL OMNIPAQUE IOHEXOL 350 MG/ML SOLN COMPARISON:  CT angiogram chest 11/13/2021.  CT abdomen 01/25/2022. FINDINGS: Cardiovascular: Heart is mildly enlarged. Aorta is normal in size. There is no pericardial effusion. There are atherosclerotic calcifications of the aorta. There is adequate opacification of the pulmonary arteries to the segmental level. There is no evidence for pulmonary embolism. Mediastinum/Nodes: There is a heterogeneous left thyroid nodule measuring up to 2.5 cm similar to prior. There are no enlarged mediastinal or hilar lymph nodes. Esophagus is nondilated. Lungs/Pleura: Mild emphysematous changes are present. There stable nodular densities in the right lower lobe measuring up to 3 mm. There is a new rounded 4 mm nodule in the superior segment of the left lower lobe image 6/43. The lungs are otherwise clear. There is no pleural effusion or pneumothorax. Upper Abdomen: Stable hypodense lesions in the liver. Stable pancreatic calcifications. Musculoskeletal: No chest wall abnormality. No acute or significant osseous findings. Review of the MIP images confirms the above findings. IMPRESSION: 1. No evidence for pulmonary embolism. 2. Mild cardiomegaly. 3. New 4 mm left solid pulmonary nodule. Per Fleischner Society Guidelines, no routine follow-up imaging is recommended. These guidelines do not apply to immunocompromised patients and patients with cancer. Follow up in patients with significant comorbidities as clinically warranted. For lung cancer screening, adhere to Lung-RADS guidelines. Reference: Radiology. 2017; 284(1):228-43. 4. Stable right lower lobe pulmonary nodules measuring up to 3 mm. Aortic Atherosclerosis (ICD10-I70.0). Electronically Signed   By: Darliss Cheney M.D.   On: 04/25/2022 23:35   DG Chest  Port 1 View  Result Date: 04/25/2022 CLINICAL DATA:  Shortness of breath. EXAM: PORTABLE CHEST 1 VIEW COMPARISON:  04/13/2022. FINDINGS: The heart is enlarged and the mediastinal contour is within normal limits. There is atherosclerotic calcification of the aorta. Hyperinflation of the lungs is noted. There is chronic prominent interstitial thickening. No consolidation, effusion, or pneumothorax. No acute osseous abnormality. IMPRESSION: 1. Chronic interstitial prominence and hyperinflation of the lungs with no acute abnormality. 2. Cardiomegaly. Electronically Signed   By: Thornell Sartorius M.D.   On: 04/25/2022 22:18   MR BRAIN WO CONTRAST  Result Date: 04/13/2022 CLINICAL DATA:  Stroke suspected EXAM: MRI HEAD WITHOUT CONTRAST TECHNIQUE: Multiplanar, multiecho pulse sequences of the brain and surrounding structures were obtained without intravenous contrast. COMPARISON:  MRI Head 06/04/17 FINDINGS: Brain: Negative for an acute infarct. Sequela of severe chronic microvascular ischemic change near confluence T2/FLAIR hyperintense periventricular signal abnormality. There are few small microhemorrhages in the bilateral thalami and cerebellar hemispheres, favored to be hypertensive in nature. No extra-axial fluid collection. Chronic infarct in the left basal ganglia. And bilateral corona radiata Vascular: Normal flow voids. Skull and upper cervical spine: There is a 1.5 cm cystic lesion in the subcutaneous soft tissues over the left occipital calvarium, favored to represent a small epidermal inclusion cyst. Postsurgical changes of the  right orbital floor. Sinuses/Orbits: No middle ear or mastoid effusion. Mucosal thickening left maxillary sinus and bilateral ethmoid sinuses. Other: None IMPRESSION: 1. No acute intracranial process. 2. Sequela of severe chronic microvascular ischemic change. Electronically Signed   By: Lorenza Cambridge M.D.   On: 04/13/2022 15:25   DG Chest 1 View  Result Date: 04/13/2022 CLINICAL  DATA:  near-syncope EXAM: CHEST  1 VIEW COMPARISON:  Chest x-ray 02/24/2022. FINDINGS: Chronic mild prominence of the lung markings. No consolidation. No visible pleural effusions or pneumothorax. Cardiomediastinal silhouette is unchanged. Polyarticular degenerative change. Osteopenia. No acute displaced fracture identified. IMPRESSION: No evidence of acute cardiopulmonary disease. Electronically Signed   By: Feliberto Harts M.D.   On: 04/13/2022 13:41    Microbiology: Results for orders placed or performed during the hospital encounter of 04/25/22  Resp panel by RT-PCR (RSV, Flu A&B, Covid) Anterior Nasal Swab     Status: None   Collection Time: 04/25/22  9:47 PM   Specimen: Anterior Nasal Swab  Result Value Ref Range Status   SARS Coronavirus 2 by RT PCR NEGATIVE NEGATIVE Final    Comment: (NOTE) SARS-CoV-2 target nucleic acids are NOT DETECTED.  The SARS-CoV-2 RNA is generally detectable in upper respiratory specimens during the acute phase of infection. The lowest concentration of SARS-CoV-2 viral copies this assay can detect is 138 copies/mL. A negative result does not preclude SARS-Cov-2 infection and should not be used as the sole basis for treatment or other patient management decisions. A negative result may occur with  improper specimen collection/handling, submission of specimen other than nasopharyngeal swab, presence of viral mutation(s) within the areas targeted by this assay, and inadequate number of viral copies(<138 copies/mL). A negative result must be combined with clinical observations, patient history, and epidemiological information. The expected result is Negative.  Fact Sheet for Patients:  BloggerCourse.com  Fact Sheet for Healthcare Providers:  SeriousBroker.it  This test is no t yet approved or cleared by the [place] FDA and  has been authorized for detection and/or diagnosis of SARS-CoV-2 by FDA  under an Emergency Use Authorization (EUA). This EUA will remain  in effect (meaning this test can be used) for the duration of the COVID-19 declaration under Section 564(b)(1) of the Act, 21 U.S.C.section 360bbb-3(b)(1), unless the authorization is terminated  or revoked sooner.       Influenza A by PCR NEGATIVE NEGATIVE Final   Influenza B by PCR NEGATIVE NEGATIVE Final    Comment: (NOTE) The Xpert Xpress SARS-CoV-2/FLU/RSV plus assay is intended as an aid in the diagnosis of influenza from Nasopharyngeal swab specimens and should not be used as a sole basis for treatment. Nasal washings and aspirates are unacceptable for Xpert Xpress SARS-CoV-2/FLU/RSV testing.  Fact Sheet for Patients: BloggerCourse.com  Fact Sheet for Healthcare Providers: SeriousBroker.it  This test is not yet approved or cleared by the [place] FDA and has been authorized for detection and/or diagnosis of SARS-CoV-2 by FDA under an Emergency Use Authorization (EUA). This EUA will remain in effect (meaning this test can be used) for the duration of the COVID-19 declaration under Section 564(b)(1) of the Act, 21 U.S.C. section 360bbb-3(b)(1), unless the authorization is terminated or revoked.     Resp Syncytial Virus by PCR NEGATIVE NEGATIVE Final    Comment: (NOTE) Fact Sheet for Patients: BloggerCourse.com  Fact Sheet for Healthcare Providers: SeriousBroker.it  This test is not yet approved or cleared by the [place] FDA and has been authorized for detection and/or diagnosis of  SARS-CoV-2 by FDA under an Emergency Use Authorization (EUA). This EUA will remain in effect (meaning this test can be used) for the duration of the COVID-19 declaration under Section 564(b)(1) of the Act, 21 U.S.C. section 360bbb-3(b)(1), unless the authorization is terminated or revoked.  Performed at [facility], [address], [place], [place] [address]     Labs: CBC: Recent Labs  Lab 04/29/22 1013 04/29/22 2147 05/01/22 0433 05/04/22 0050 05/04/22 0804 05/04/22 1216 05/05/22 0241  WBC 8.7   < > 8.1 9.6 11.2* 10.8* 9.9  NEUTROABS 7.8*  --   --  8.1* 7.9*  --   --   HGB 13.5   < > 12.3* 13.2 13.5 12.9* 14.7  HCT 43.1   < > 39.0 40.8 42.3 40.5 46.4  MCV 90.2   < > 89.7 87.4 88.5 88.8 89.2  PLT 237   < > 243 272 296 253 273   < > = values in this interval not displayed.   Basic Metabolic Panel: Recent Labs  Lab 05/01/22 0433 05/04/22 0050 05/04/22 1216 05/05/22 0241 05/06/22 0403  NA 140 135 137 142 137  K 3.8 3.8 3.4* 4.4 4.2  CL 109 105 105 110 100  CO2 24 20* 24 24 28   GLUCOSE 155* 304* 97 119* 295*  BUN 42* 42* 43* 41* 44*  CREATININE 1.98* 1.93* 1.76* 1.88* 1.97*  CALCIUM 8.3* 8.6* 8.4* 8.7* 8.4*  MG 2.2  --   --   --   --    Liver Function Tests: Recent Labs  Lab 05/01/22 0433  AST 20  ALT 24  ALKPHOS 120  BILITOT 0.6  PROT 6.5  ALBUMIN 3.3*   CBG: No results for input(s): "GLUCAP" in the last 168 hours.  Discharge time spent: greater than 30 minutes.  Signed: Catarina Hartshorn, MD [facility] 05/06/2022

## 2022-05-07 ENCOUNTER — Other Ambulatory Visit: Payer: Self-pay

## 2022-05-07 ENCOUNTER — Encounter (HOSPITAL_COMMUNITY): Payer: Self-pay | Admitting: Emergency Medicine

## 2022-05-07 ENCOUNTER — Emergency Department (HOSPITAL_COMMUNITY)
Admission: EM | Admit: 2022-05-07 | Discharge: 2022-05-07 | Disposition: A | Discharge status: Home / Self Care | Payer: 59 | Attending: Emergency Medicine | Admitting: Emergency Medicine

## 2022-05-07 ENCOUNTER — Emergency Department (HOSPITAL_COMMUNITY): Payer: 59

## 2022-05-07 DIAGNOSIS — J449 Chronic obstructive pulmonary disease, unspecified: Secondary | ICD-10-CM | POA: Insufficient documentation

## 2022-05-07 DIAGNOSIS — R0602 Shortness of breath: Secondary | ICD-10-CM | POA: Diagnosis not present

## 2022-05-07 DIAGNOSIS — R069 Unspecified abnormalities of breathing: Secondary | ICD-10-CM | POA: Diagnosis not present

## 2022-05-07 DIAGNOSIS — Z743 Need for continuous supervision: Secondary | ICD-10-CM | POA: Diagnosis not present

## 2022-05-07 DIAGNOSIS — R6889 Other general symptoms and signs: Secondary | ICD-10-CM | POA: Diagnosis not present

## 2022-05-07 DIAGNOSIS — R0600 Dyspnea, unspecified: Secondary | ICD-10-CM | POA: Diagnosis not present

## 2022-05-07 DIAGNOSIS — Z7951 Long term (current) use of inhaled steroids: Secondary | ICD-10-CM | POA: Diagnosis not present

## 2022-05-07 LAB — BASIC METABOLIC PANEL
Anion gap: 11 (ref 5–15)
BUN: 45 mg/dL — ABNORMAL HIGH (ref 8–23)
CO2: 22 mmol/L (ref 22–32)
Calcium: 8.3 mg/dL — ABNORMAL LOW (ref 8.9–10.3)
Chloride: 103 mmol/L (ref 98–111)
Creatinine, Ser: 1.84 mg/dL — ABNORMAL HIGH (ref 0.61–1.24)
GFR, Estimated: 40 mL/min — ABNORMAL LOW (ref >60)
Glucose, Bld: 143 mg/dL — ABNORMAL HIGH (ref 70–99)
Potassium: 3.8 mmol/L (ref 3.5–5.1)
Sodium: 136 mmol/L (ref 135–145)

## 2022-05-07 LAB — CBC WITH DIFFERENTIAL/PLATELET
Abs Immature Granulocytes: 0.03 10*3/uL (ref 0.00–0.07)
Basophils Absolute: 0 10*3/uL (ref 0.0–0.1)
Basophils Relative: 0 %
Eosinophils Absolute: 0 10*3/uL (ref 0.0–0.5)
Eosinophils Relative: 0 %
HCT: 41.3 % (ref 39.0–52.0)
Hemoglobin: 13.4 g/dL (ref 13.0–17.0)
Immature Granulocytes: 0 %
Lymphocytes Relative: 10 %
Lymphs Abs: 1.2 10*3/uL (ref 0.7–4.0)
MCH: 29 pg (ref 26.0–34.0)
MCHC: 32.4 g/dL (ref 30.0–36.0)
MCV: 89.4 fL (ref 80.0–100.0)
Monocytes Absolute: 0.7 10*3/uL (ref 0.1–1.0)
Monocytes Relative: 5 %
Neutro Abs: 10.9 10*3/uL — ABNORMAL HIGH (ref 1.7–7.7)
Neutrophils Relative %: 85 %
Platelets: ADEQUATE 10*3/uL (ref 150–400)
RBC: 4.62 MIL/uL (ref 4.22–5.81)
RDW: 14.6 % (ref 11.5–15.5)
WBC: 12.8 10*3/uL — ABNORMAL HIGH (ref 4.0–10.5)
nRBC: 0 % (ref 0.0–0.2)

## 2022-05-07 LAB — BRAIN NATRIURETIC PEPTIDE: B Natriuretic Peptide: 1618 pg/mL — ABNORMAL HIGH (ref 0.0–100.0)

## 2022-05-07 MED ORDER — FUROSEMIDE 40 MG PO TABS
40.0000 mg | ORAL_TABLET | Freq: Once | ORAL | Status: AC
  Administered 2022-05-07: 40 mg via ORAL
  Filled 2022-05-07: qty 1

## 2022-05-07 MED ORDER — FUROSEMIDE 20 MG PO TABS: 20.0000 mg | ORAL_TABLET | Freq: Every day | ORAL | 0 refills | Status: DC

## 2022-05-07 NOTE — ED Triage Notes (Signed)
Pt BIB [facility] for continued SOB. 100% on RA for EMS. Pt admitted to EMS that he homeless and didn't have anywhere to go.

## 2022-05-07 NOTE — ED Provider Notes (Signed)
[place] EMERGENCY DEPARTMENT AT [facility] Provider Note   CSN: [id number] Arrival date & time: 05/07/22  0043     History  Chief Complaint  Patient presents with   Shortness of Breath    Michael [name] is a 67 y.o. male.  67 year old male with a history of COPD and recently admitted for pulmonary edema that presents the ER today with shortness of breath.  Patient states that he was discharged yesterday afternoon.  States he still has shortness of breath.  Called EMS for transport and reportedly told them that he just had no place to go.  No other new symptoms.  No changes in symptoms otherwise.  Patient states he is not on any fluid pills at home.  He states that he is trying to get a hold of his son to see if he can stay with him.   Shortness of Breath      Home Medications Prior to Admission medications   Medication Sig Start Date End Date Taking? Authorizing Provider  furosemide (LASIX) 20 MG tablet Take 1 tablet (20 mg total) by mouth daily. For 3 days then take as needed for dyspnea, weight gain or lower extremity swelling. 05/07/22  Yes Kenlea Woodell, Barbara Cower, MD  acetaminophen (TYLENOL) 650 MG CR tablet Take 1,300 mg by mouth as needed for pain.    [provider]  albuterol (VENTOLIN HFA) 108 (90 Base) MCG/ACT inhaler Inhale 2 puffs into the lungs every 6 (six) hours as needed for wheezing or shortness of breath. 04/27/22   Shahmehdi, Gemma Payor, MD  atorvastatin (LIPITOR) 80 MG tablet Take 1 tablet (80 mg total) by mouth every evening. 04/27/22   Shahmehdi, Gemma Payor, MD  Budeson-Glycopyrrol-Formoterol (BREZTRI AEROSPHERE) 160-9-4.8 MCG/ACT AERO Inhale 2 puffs into the lungs 2 (two) times daily.    [provider]  clopidogrel (PLAVIX) 75 MG tablet Take 1 tablet (75 mg total) by mouth daily. 04/27/22   Shahmehdi, Gemma Payor, MD  isosorbide mononitrate (IMDUR) 60 MG 24 hr tablet Take 1 tablet (60 mg total) by mouth every evening. 04/27/22   Shahmehdi, Gemma Payor, MD   predniSONE (DELTASONE) 10 MG tablet Take 4 tablets (40 mg total) by mouth daily for 3 days, THEN 3 tablets (30 mg total) daily for 3 days, THEN 2 tablets (20 mg total) daily for 3 days, THEN 1 tablet (10 mg total) daily for 3 days. 05/01/22 05/13/22  Azucena Fallen, MD      Allergies    Patient has no known allergies.    Review of Systems   Review of Systems  Respiratory:  Positive for shortness of breath.     Physical Exam Updated Vital Signs BP (!) 166/90   Pulse 72   Temp 98.8 F (37.1 C)   Resp 16   Ht 6' (1.829 m)   Wt 67.7 kg   SpO2 98%   BMI 20.24 kg/m  Physical Exam Vitals and nursing note reviewed.  Constitutional:      Appearance: He is well-developed.  HENT:     Head: Normocephalic and atraumatic.  Cardiovascular:     Rate and Rhythm: Normal rate.  Pulmonary:     Effort: Pulmonary effort is normal. No respiratory distress.     Breath sounds: No wheezing or rales.  Abdominal:     General: There is no distension.  Musculoskeletal:        General: Normal range of motion.     Cervical back: Normal range of motion.  Right lower leg: No edema.     Left lower leg: No edema.  Neurological:     Mental Status: He is alert.     ED Results / Procedures / Treatments   Labs (all labs ordered are listed, but only abnormal results are displayed) Labs Reviewed  CBC WITH DIFFERENTIAL/PLATELET - Abnormal; Notable for the following components:      Result Value   WBC 12.8 (*)    Neutro Abs 10.9 (*)    All other components within normal limits  BASIC METABOLIC PANEL - Abnormal; Notable for the following components:   Glucose, Bld 143 (*)    BUN 45 (*)    Creatinine, Ser 1.84 (*)    Calcium 8.3 (*)    GFR, Estimated 40 (*)    All other components within normal limits  BRAIN NATRIURETIC PEPTIDE - Abnormal; Notable for the following components:   B Natriuretic Peptide 1,618.0 (*)    All other components within normal limits    EKG None  Radiology DG  Chest Portable 1 View  Result Date: 05/07/2022 CLINICAL DATA:  Difficulty breathing EXAM: PORTABLE CHEST 1 VIEW COMPARISON:  05/05/2022 FINDINGS: Cardiac shadow is stable. Lungs are well aerated bilaterally. No focal infiltrate or sizable effusion is seen. No bony abnormality is noted. IMPRESSION: No acute abnormality noted. Electronically Signed   By: Alcide Clever M.D.   On: 05/07/2022 03:51    Procedures Procedures    Medications Ordered in ED Medications  furosemide (LASIX) tablet 40 mg (40 mg Oral Given 05/07/22 0458)    ED Course/ Medical Decision Making/ A&P                             Medical Decision Making Amount and/or Complexity of Data Reviewed Labs: ordered. Radiology: ordered.  Risk Prescription drug management.   Overall appears well.  No obvious abnormal lung sounds.  Vital signs within normal limits.  His BNP is still little bit high but much better than on recent admission.  Suspect likely malingering at this time however we will go ahead and diurese a little bit given prescription for short course of Lasix.  Will need PCP follow-up for further management.  Final Clinical Impression(s) / ED Diagnoses Final diagnoses:  Dyspnea, unspecified type    Rx / DC Orders ED Discharge Orders          Ordered    furosemide (LASIX) 20 MG tablet  Daily        05/07/22 0625              Alima Naser, Barbara Cower, MD 05/07/22 [phone]

## 2022-05-09 ENCOUNTER — Inpatient Hospital Stay (HOSPITAL_BASED_OUTPATIENT_CLINIC_OR_DEPARTMENT_OTHER): Payer: 59 | Admitting: Physician Assistant

## 2022-05-09 ENCOUNTER — Other Ambulatory Visit: Payer: Self-pay

## 2022-05-09 VITALS — BP 199/80 | HR 71 | Temp 97.9°F | Resp 18 | Wt 157.9 lb

## 2022-05-09 DIAGNOSIS — E559 Vitamin D deficiency, unspecified: Secondary | ICD-10-CM

## 2022-05-09 DIAGNOSIS — Z923 Personal history of irradiation: Secondary | ICD-10-CM | POA: Diagnosis not present

## 2022-05-09 DIAGNOSIS — D500 Iron deficiency anemia secondary to blood loss (chronic): Secondary | ICD-10-CM

## 2022-05-09 DIAGNOSIS — I10 Essential (primary) hypertension: Secondary | ICD-10-CM | POA: Diagnosis not present

## 2022-05-09 DIAGNOSIS — Z87891 Personal history of nicotine dependence: Secondary | ICD-10-CM | POA: Diagnosis not present

## 2022-05-09 DIAGNOSIS — Z7952 Long term (current) use of systemic steroids: Secondary | ICD-10-CM | POA: Diagnosis not present

## 2022-05-09 DIAGNOSIS — D631 Anemia in chronic kidney disease: Secondary | ICD-10-CM | POA: Diagnosis not present

## 2022-05-09 DIAGNOSIS — Z79899 Other long term (current) drug therapy: Secondary | ICD-10-CM | POA: Diagnosis not present

## 2022-05-09 DIAGNOSIS — N1832 Chronic kidney disease, stage 3b: Secondary | ICD-10-CM | POA: Diagnosis not present

## 2022-05-09 DIAGNOSIS — Z85828 Personal history of other malignant neoplasm of skin: Secondary | ICD-10-CM | POA: Diagnosis not present

## 2022-05-09 DIAGNOSIS — D509 Iron deficiency anemia, unspecified: Secondary | ICD-10-CM | POA: Diagnosis not present

## 2022-05-09 DIAGNOSIS — I129 Hypertensive chronic kidney disease with stage 1 through stage 4 chronic kidney disease, or unspecified chronic kidney disease: Secondary | ICD-10-CM | POA: Diagnosis not present

## 2022-05-09 LAB — URINE CULTURE: Culture: >=100000 — AB

## 2022-05-09 NOTE — Patient Instructions (Signed)
[facility] at [facility] Discharge Instructions  You were seen today by Rojelio Brenner PA-C for your iron deficiency anemia.  Your blood and iron levels remain low.  You may be continuing to lose blood from somewhere in your GI tract, and it is important that you continue to follow-up with the GI doctors.  We will schedule you for IV iron infusion x 3 doses.   Your blood pressure was high at your visit today.  Please make sure that you do not miss any doses of your blood pressure medications.  If your blood pressure remains high at home, please call your primary care doctor for adjustment of your blood pressure medications.  Seek immediate medical attention if you experience any chest pain, difficulty breathing, headache, dizziness, blurry vision, or strokelike symptoms.  FOLLOW-UP APPOINTMENT: Office visit in 3 months, after labs   Thank you for choosing [facility] at [facility] to provide your oncology and hematology care.  To afford each patient quality time with our provider, please arrive at least 15 minutes before your scheduled appointment time.   If you have a lab appointment with the Cancer Center please come in thru the Main Entrance and check in at the main information desk.  You need to re-schedule your appointment should you arrive 10 or more minutes late.  We strive to give you quality time with our providers, and arriving late affects you and other patients whose appointments are after yours.  Also, if you no show three or more times for appointments you may be dismissed from the clinic at the providers discretion.     Again, thank you for choosing [facility].  Our hope is that these requests will decrease the amount of time that you wait before being seen by our physicians.       _____________________________________________________________  Should you have questions after your visit to [facility],  please contact our office at [phone] and follow the prompts.  Our office hours are 8:00 a.m. and 4:30 p.m. Monday - Friday.  Please note that voicemails left after 4:00 p.m. may not be returned until the following business day.  We are closed weekends and major holidays.  You do have access to a nurse 24-7, just call the main number to the clinic [phone] and do not press any options, hold on the line and a nurse will answer the phone.    For prescription refill requests, have your pharmacy contact our office and allow 72 hours.    Due to Covid, you will need to wear a mask upon entering the hospital. If you do not have a mask, a mask will be given to you at the Main Entrance upon arrival. For doctor visits, patients may have 1 support person age 25 or older with them. For treatment visits, patients can not have anyone with them due to social distancing guidelines and our immunocompromised population.

## 2022-05-11 ENCOUNTER — Inpatient Hospital Stay: Payer: 59

## 2022-05-11 VITALS — BP 116/74 | HR 74 | Temp 98.7°F | Resp 18

## 2022-05-11 DIAGNOSIS — D500 Iron deficiency anemia secondary to blood loss (chronic): Secondary | ICD-10-CM

## 2022-05-11 DIAGNOSIS — Z79899 Other long term (current) drug therapy: Secondary | ICD-10-CM | POA: Diagnosis not present

## 2022-05-11 DIAGNOSIS — Z7952 Long term (current) use of systemic steroids: Secondary | ICD-10-CM | POA: Diagnosis not present

## 2022-05-11 DIAGNOSIS — E559 Vitamin D deficiency, unspecified: Secondary | ICD-10-CM | POA: Diagnosis not present

## 2022-05-11 DIAGNOSIS — I129 Hypertensive chronic kidney disease with stage 1 through stage 4 chronic kidney disease, or unspecified chronic kidney disease: Secondary | ICD-10-CM | POA: Diagnosis not present

## 2022-05-11 DIAGNOSIS — D509 Iron deficiency anemia, unspecified: Secondary | ICD-10-CM | POA: Diagnosis not present

## 2022-05-11 DIAGNOSIS — Z85828 Personal history of other malignant neoplasm of skin: Secondary | ICD-10-CM | POA: Diagnosis not present

## 2022-05-11 DIAGNOSIS — N1832 Chronic kidney disease, stage 3b: Secondary | ICD-10-CM | POA: Diagnosis not present

## 2022-05-11 DIAGNOSIS — D631 Anemia in chronic kidney disease: Secondary | ICD-10-CM | POA: Diagnosis not present

## 2022-05-11 DIAGNOSIS — Z87891 Personal history of nicotine dependence: Secondary | ICD-10-CM | POA: Diagnosis not present

## 2022-05-11 DIAGNOSIS — Z923 Personal history of irradiation: Secondary | ICD-10-CM | POA: Diagnosis not present

## 2022-05-11 MED ORDER — SODIUM CHLORIDE 0.9 % IV SOLN: Freq: Once | INTRAVENOUS | Status: AC

## 2022-05-11 MED ORDER — ACETAMINOPHEN 325 MG PO TABS
650.0000 mg | ORAL_TABLET | Freq: Once | ORAL | Status: AC
  Administered 2022-05-11: 650 mg via ORAL
  Filled 2022-05-11: qty 2

## 2022-05-11 MED ORDER — CETIRIZINE HCL 10 MG PO TABS
10.0000 mg | ORAL_TABLET | Freq: Once | ORAL | Status: AC
  Administered 2022-05-11: 10 mg via ORAL
  Filled 2022-05-11: qty 1

## 2022-05-11 MED ORDER — SODIUM CHLORIDE 0.9 % IV SOLN
300.0000 mg | Freq: Once | INTRAVENOUS | Status: AC
  Administered 2022-05-11: 300 mg via INTRAVENOUS
  Filled 2022-05-11: qty 300

## 2022-05-11 MED ORDER — LORATADINE 10 MG PO TABS: 10.0000 mg | ORAL_TABLET | Freq: Once | ORAL | Status: DC

## 2022-05-11 NOTE — Progress Notes (Signed)
Patient presents today for Venofer infusion per providers order.  Vital signs within parameters for treatment.  Patient has no new complaints at this time.    Peripheral IV started and blood return noted pre and post infusion.    Stable during infusion without adverse affects.  Vital signs stable.  No complaints at this time.  Discharge from clinic ambulatory in stable condition.  Alert and oriented X 3.  Follow up with [facility] as scheduled.  

## 2022-05-11 NOTE — Patient Instructions (Signed)
[facility]  Discharge Instructions: Thank you for choosing [facility] to provide your oncology and hematology care.  If you have a lab appointment with the Cancer Center - please note that after April 8th, 2024, all labs will be drawn in the cancer center.  You do not have to check in or register with the main entrance as you have in the past but will complete your check-in in the cancer center.  Wear comfortable clothing and clothing appropriate for easy access to any Portacath or PICC line.   We strive to give you quality time with your provider. You may need to reschedule your appointment if you arrive late (15 or more minutes).  Arriving late affects you and other patients whose appointments are after yours.  Also, if you miss three or more appointments without notifying the office, you may be dismissed from the clinic at the provider's discretion.      For prescription refill requests, have your pharmacy contact our office and allow 72 hours for refills to be completed.    Today you received the following chemotherapy and/or immunotherapy agents Venofer      To help prevent nausea and vomiting after your treatment, we encourage you to take your nausea medication as directed.  BELOW ARE SYMPTOMS THAT SHOULD BE REPORTED IMMEDIATELY: *FEVER GREATER THAN 100.4 F (38 C) OR HIGHER *CHILLS OR SWEATING *NAUSEA AND VOMITING THAT IS NOT CONTROLLED WITH YOUR NAUSEA MEDICATION *UNUSUAL SHORTNESS OF BREATH *UNUSUAL BRUISING OR BLEEDING *URINARY PROBLEMS (pain or burning when urinating, or frequent urination) *BOWEL PROBLEMS (unusual diarrhea, constipation, pain near the anus) TENDERNESS IN MOUTH AND THROAT WITH OR WITHOUT PRESENCE OF ULCERS (sore throat, sores in mouth, or a toothache) UNUSUAL RASH, SWELLING OR PAIN  UNUSUAL VAGINAL DISCHARGE OR ITCHING   Items with * indicate a potential emergency and should be followed up as soon as possible or go to the  Emergency Department if any problems should occur.  Please show the CHEMOTHERAPY ALERT CARD or IMMUNOTHERAPY ALERT CARD at check-in to the Emergency Department and triage nurse.  Should you have questions after your visit or need to cancel or reschedule your appointment, please contact [facility] AT [facility] [phone]  and follow the prompts.  Office hours are 8:00 a.m. to 4:30 p.m. Monday - Friday. Please note that voicemails left after 4:00 p.m. may not be returned until the following business day.  We are closed weekends and major holidays. You have access to a nurse at all times for urgent questions. Please call the main number to the clinic [phone] and follow the prompts.  For any non-urgent questions, you may also contact your provider using MyChart. We now offer e-Visits for anyone 27 and older to request care online for non-urgent symptoms. For details visit [email or web address].   Also download the MyChart app! Go to the app store, search "MyChart", open the app, select [place], and log in with your MyChart username and password.

## 2022-05-13 ENCOUNTER — Encounter: Payer: Self-pay | Admitting: Student

## 2022-05-13 ENCOUNTER — Ambulatory Visit: Payer: 59 | Attending: Student | Admitting: Student

## 2022-05-13 VITALS — BP 166/90 | HR 60 | Ht 72.0 in | Wt 160.0 lb

## 2022-05-13 DIAGNOSIS — E782 Mixed hyperlipidemia: Secondary | ICD-10-CM

## 2022-05-13 DIAGNOSIS — N1832 Chronic kidney disease, stage 3b: Secondary | ICD-10-CM

## 2022-05-13 DIAGNOSIS — I5020 Unspecified systolic (congestive) heart failure: Secondary | ICD-10-CM

## 2022-05-13 DIAGNOSIS — I10 Essential (primary) hypertension: Secondary | ICD-10-CM | POA: Diagnosis not present

## 2022-05-13 DIAGNOSIS — I2510 Atherosclerotic heart disease of native coronary artery without angina pectoris: Secondary | ICD-10-CM

## 2022-05-13 MED ORDER — ATORVASTATIN CALCIUM 80 MG PO TABS: 80.0000 mg | ORAL_TABLET | Freq: Every evening | ORAL | 3 refills | Status: DC

## 2022-05-13 MED ORDER — CARVEDILOL 3.125 MG PO TABS: 3.1250 mg | ORAL_TABLET | Freq: Two times a day (BID) | ORAL | 3 refills | Status: DC

## 2022-05-13 MED ORDER — CLOPIDOGREL BISULFATE 75 MG PO TABS: 75.0000 mg | ORAL_TABLET | Freq: Every day | ORAL | 3 refills | Status: DC

## 2022-05-13 MED ORDER — ISOSORBIDE MONONITRATE ER 60 MG PO TB24: 60.0000 mg | ORAL_TABLET | Freq: Every evening | ORAL | 3 refills | Status: DC

## 2022-05-13 MED ORDER — FUROSEMIDE 20 MG PO TABS: 20.0000 mg | ORAL_TABLET | Freq: Every day | ORAL | 3 refills | Status: DC

## 2022-05-13 NOTE — Progress Notes (Signed)
Cardiology Office Note    Date:  05/13/2022  ID:  Michael [name], DOB April 08, 1955, MRN [id number] Cardiologist: Nona Dell, MD    History of Present Illness:    Michael [name] is a 66 y.o. male with past medical history of HFrEF (EF 30% in 10/2021, at 35-40% in 04/2022), CAD (cath in 10/2021 showing mild to moderate obstructive CAD along mid-RCA and acute Mrg with medical management recommended), carotid artery stenosis (s/p right TCAR in 01/2021), iron deficiency anemia, HTN, HLD,  COPD and Stage 3 CKD who presents to the office today for hospital follow-up.   He has experienced multiple admissions over the past few months including admissions for acute CHF exacerbations and a prior syncopal episode. His recurrent presentations were driven by homelessness as he was not able to take his medications regularly. His most recent admission was at [facility] from 4/4 - 05/06/2022 for acute dyspnea in the setting of a COPD exacerbation and acute CHF exacerbation. His BNP was elevated to 2998 which was similar to prior admissions. ReDS vest was normal at 30. He was continued on Imdur 60 mg daily, ASA 81 mg daily, Plavix 85 mg daily and Atorvastatin 80 mg daily. He received IV Lasix during admission and was not discharged on diuretic therapy due to orthostasis. Coreg was also discontinued secondary to this.  He presented back to the ED on 05/07/2022 due to dyspnea but reported he had called EMS due to being homeless and no place to go. He received IV Lasix and was discharged on Lasix 20 mg daily.  In talking with the patient today, he reports overall doing well since his recent hospitalization.  He has baseline dyspnea on exertion but denies any acute changes in this. No specific orthopnea, PND or pitting edema. He denies any recent chest pain or palpitations. No recurrent syncopal episodes. He is currently staying with family members and says he did obtain refills of his medications earlier today.  Studies  Reviewed:   EKG: EKG is not ordered today.  R/LHC: 10/2021 Mid RCA lesion is 70% stenosed.   Acute Mrg lesion is 80% stenosed.   1.  Mild to moderate obstructive coronary artery disease that is outweighed by the patient's degree of LV dysfunction.  There is a significant bifurcation lesion of the right coronary artery and large RV marginal branch.  This is moderately calcified and relatively tortuous.  Given the patient's lack of chest pain, medical therapy should be pursued. 2.  Cardiac output of 6.3 L/min, cardiac index of 3.3 L/min/m, mean RA pressure of 16 mmHg, mean wedge pressure of 21 mmHg, and LVEDP of 21 mmHg.   Recommendation: Goal-directed medical therapy with augmentation of diuretics; consider dual antiplatelet therapy for 1 year for medical treatment of acute coronary syndrome.  Echocardiogram: 04/2022 IMPRESSIONS     1. Left ventricular ejection fraction, by estimation, is 35 to 40%. The  left ventricle has moderately decreased function. The left ventricle  demonstrates global hypokinesis. Left ventricular diastolic parameters are  indeterminate.   2. Right ventricular systolic function is normal. The right ventricular  size is normal. There is normal pulmonary artery systolic pressure.   3. The mitral valve is normal in structure. No evidence of mitral valve  regurgitation. No evidence of mitral stenosis.   4. The aortic valve has an indeterminant number of cusps. There is mild  calcification of the aortic valve. There is mild thickening of the aortic  valve. Aortic valve regurgitation is not visualized. No aortic  stenosis is  present.   5. The inferior vena cava is normal in size with greater than 50%  respiratory variability, suggesting right atrial pressure of 3 mmHg.   Physical Exam:   VS:  BP (!) 166/90   Pulse 60   Ht 6' (1.829 m)   Wt 160 lb (72.6 kg)   SpO2 98%   BMI 21.70 kg/m    Wt Readings from Last 3 Encounters:  05/13/22 160 lb (72.6 kg)   05/09/22 157 lb 14.4 oz (71.6 kg)  05/07/22 149 lb 4 oz (67.7 kg)     GEN: Well nourished, well developed male appearing in no acute distress. Evidence of prior nasal surgery (for skin cancer by review of records).  NECK: No JVD; No carotid bruits CARDIAC: RRR, no murmurs, rubs, gallops RESPIRATORY:  Clear to auscultation without rales, wheezing or rhonchi  ABDOMEN: Appears non-distended. No obvious abdominal masses. EXTREMITIES: No clubbing or cyanosis. No pitting edema.  Distal pedal pulses are 2+ bilaterally.   Assessment and Plan:   1. HFrEF - He has a known cardiomyopathy with an EF at 35 to 40% by most recent imaging. Medical management has been limited secondary to orthostatic hypotension, CKD and medication noncompliance. At this time, will plan to restart Coreg 3.125 mg twice daily and continue Lasix 20 mg daily and Imdur 60 mg daily. Pending his BP trend and reassessment of renal function, would like to restart Entresto or initiate an SGLT2 inhibitor at his next visit. Will make gradual changes given prior noncompliance and due to his variable BP.  2. CAD - Prior cardiac catheterization in 10/2021 showing mild to moderate obstructive CAD along the mid-RCA and acute Mrg with medical management recommended. - He has baseline dyspnea on exertion but denies any recent chest pain. - Continue current medical therapy with Plavix 75 mg daily, Imdur 60 mg daily and Atorvastatin 80 mg daily. Will restart Coreg 3.125 mg twice daily.  3. HTN - His BP is at 166/90 during today's visit but he did experience episodes of orthostatic hypotension during prior admissions as outlined above. Given his elevated BP, will plan to restart Coreg but at a lower dose of 3.125 mg twice daily. Continue Imdur 60 mg daily.  4. HLD - His LDL was at 64 in 07/2021. He has recently restarted Atorvastatin 80 mg daily and will continue on this.  5. Stage 3 CKD - Baseline creatinine appears to be between 1.7 -  1.9. Stable at 1.84 when checked on 05/07/2022.  Signed, Ellsworth Lennox, PA-C

## 2022-05-13 NOTE — Patient Instructions (Signed)
Medication Instructions:  Your physician recommends that you continue on your current medications as directed. Please refer to the Current Medication list given to you today.   Start Coreg 3.125 mg Two Times Daily   *If you need a refill on your cardiac medications before your next appointment, please call your pharmacy*   Lab Work: NONE   If you have labs (blood work) drawn today and your tests are completely normal, you will receive your results only by: MyChart Message (if you have MyChart) OR A paper copy in the mail If you have any lab test that is abnormal or we need to change your treatment, we will call you to review the results.   Testing/Procedures: NONE    Follow-Up: At [facility], you and your health needs are our priority.  As part of our continuing mission to provide you with exceptional heart care, we have created designated Provider Care Teams.  These Care Teams include your primary Cardiologist (physician) and Advanced Practice Providers (APPs -  Physician Assistants and Nurse Practitioners) who all work together to provide you with the care you need, when you need it.  We recommend signing up for the patient portal called "MyChart".  Sign up information is provided on this After Visit Summary.  MyChart is used to connect with patients for Virtual Visits (Telemedicine).  Patients are able to view lab/test results, encounter notes, upcoming appointments, etc.  Non-urgent messages can be sent to your provider as well.   To learn more about what you can do with MyChart, go to [email or web address].    Your next appointment:   6 -8 week(s)  Provider:   Nona Dell, MD or Randall An, PA-C    Other Instructions Thank you for choosing [facility]!

## 2022-05-16 DIAGNOSIS — F17210 Nicotine dependence, cigarettes, uncomplicated: Secondary | ICD-10-CM | POA: Diagnosis not present

## 2022-05-16 DIAGNOSIS — R0600 Dyspnea, unspecified: Secondary | ICD-10-CM | POA: Diagnosis not present

## 2022-05-16 DIAGNOSIS — J449 Chronic obstructive pulmonary disease, unspecified: Secondary | ICD-10-CM | POA: Diagnosis not present

## 2022-05-16 DIAGNOSIS — I5043 Acute on chronic combined systolic (congestive) and diastolic (congestive) heart failure: Secondary | ICD-10-CM | POA: Diagnosis not present

## 2022-05-16 DIAGNOSIS — Z7952 Long term (current) use of systemic steroids: Secondary | ICD-10-CM | POA: Diagnosis not present

## 2022-05-16 DIAGNOSIS — R55 Syncope and collapse: Secondary | ICD-10-CM | POA: Diagnosis not present

## 2022-05-16 DIAGNOSIS — Z79899 Other long term (current) drug therapy: Secondary | ICD-10-CM | POA: Diagnosis not present

## 2022-05-17 DIAGNOSIS — Z85828 Personal history of other malignant neoplasm of skin: Secondary | ICD-10-CM | POA: Insufficient documentation

## 2022-05-17 DIAGNOSIS — Z91199 Patient's noncompliance with other medical treatment and regimen due to unspecified reason: Secondary | ICD-10-CM | POA: Insufficient documentation

## 2022-05-17 DIAGNOSIS — I951 Orthostatic hypotension: Secondary | ICD-10-CM | POA: Insufficient documentation

## 2022-05-17 DIAGNOSIS — F17210 Nicotine dependence, cigarettes, uncomplicated: Secondary | ICD-10-CM | POA: Insufficient documentation

## 2022-05-17 DIAGNOSIS — E559 Vitamin D deficiency, unspecified: Secondary | ICD-10-CM | POA: Insufficient documentation

## 2022-05-20 ENCOUNTER — Inpatient Hospital Stay: Payer: 59

## 2022-05-25 ENCOUNTER — Other Ambulatory Visit: Payer: Self-pay | Admitting: Cardiology

## 2022-05-25 DIAGNOSIS — C44311 Basal cell carcinoma of skin of nose: Secondary | ICD-10-CM | POA: Diagnosis not present

## 2022-05-25 DIAGNOSIS — N1832 Chronic kidney disease, stage 3b: Secondary | ICD-10-CM | POA: Diagnosis not present

## 2022-05-25 DIAGNOSIS — I2510 Atherosclerotic heart disease of native coronary artery without angina pectoris: Secondary | ICD-10-CM | POA: Diagnosis not present

## 2022-05-25 DIAGNOSIS — D509 Iron deficiency anemia, unspecified: Secondary | ICD-10-CM | POA: Diagnosis not present

## 2022-05-25 DIAGNOSIS — R809 Proteinuria, unspecified: Secondary | ICD-10-CM | POA: Diagnosis not present

## 2022-05-25 DIAGNOSIS — I10 Essential (primary) hypertension: Secondary | ICD-10-CM | POA: Diagnosis not present

## 2022-05-25 DIAGNOSIS — R55 Syncope and collapse: Secondary | ICD-10-CM | POA: Diagnosis not present

## 2022-05-25 DIAGNOSIS — E559 Vitamin D deficiency, unspecified: Secondary | ICD-10-CM | POA: Diagnosis not present

## 2022-05-25 DIAGNOSIS — D126 Benign neoplasm of colon, unspecified: Secondary | ICD-10-CM | POA: Diagnosis not present

## 2022-05-25 DIAGNOSIS — I5043 Acute on chronic combined systolic (congestive) and diastolic (congestive) heart failure: Secondary | ICD-10-CM | POA: Diagnosis not present

## 2022-05-25 DIAGNOSIS — I130 Hypertensive heart and chronic kidney disease with heart failure and stage 1 through stage 4 chronic kidney disease, or unspecified chronic kidney disease: Secondary | ICD-10-CM | POA: Diagnosis not present

## 2022-05-25 DIAGNOSIS — R0600 Dyspnea, unspecified: Secondary | ICD-10-CM | POA: Diagnosis not present

## 2022-05-25 DIAGNOSIS — J449 Chronic obstructive pulmonary disease, unspecified: Secondary | ICD-10-CM | POA: Diagnosis not present

## 2022-05-27 ENCOUNTER — Inpatient Hospital Stay: Payer: 59

## 2022-05-27 VITALS — BP 142/88 | HR 74 | Temp 96.7°F | Resp 18

## 2022-05-27 DIAGNOSIS — Z923 Personal history of irradiation: Secondary | ICD-10-CM | POA: Diagnosis not present

## 2022-05-27 DIAGNOSIS — N1832 Chronic kidney disease, stage 3b: Secondary | ICD-10-CM | POA: Diagnosis not present

## 2022-05-27 DIAGNOSIS — D509 Iron deficiency anemia, unspecified: Secondary | ICD-10-CM | POA: Diagnosis not present

## 2022-05-27 DIAGNOSIS — Z87891 Personal history of nicotine dependence: Secondary | ICD-10-CM | POA: Diagnosis not present

## 2022-05-27 DIAGNOSIS — I129 Hypertensive chronic kidney disease with stage 1 through stage 4 chronic kidney disease, or unspecified chronic kidney disease: Secondary | ICD-10-CM | POA: Diagnosis not present

## 2022-05-27 DIAGNOSIS — Z85828 Personal history of other malignant neoplasm of skin: Secondary | ICD-10-CM | POA: Diagnosis not present

## 2022-05-27 DIAGNOSIS — D500 Iron deficiency anemia secondary to blood loss (chronic): Secondary | ICD-10-CM

## 2022-05-27 DIAGNOSIS — D631 Anemia in chronic kidney disease: Secondary | ICD-10-CM | POA: Diagnosis not present

## 2022-05-27 DIAGNOSIS — Z79899 Other long term (current) drug therapy: Secondary | ICD-10-CM | POA: Diagnosis not present

## 2022-05-27 DIAGNOSIS — E559 Vitamin D deficiency, unspecified: Secondary | ICD-10-CM | POA: Diagnosis not present

## 2022-05-27 DIAGNOSIS — Z7952 Long term (current) use of systemic steroids: Secondary | ICD-10-CM | POA: Diagnosis not present

## 2022-05-27 MED ORDER — SODIUM CHLORIDE 0.9 % IV SOLN
300.0000 mg | Freq: Once | INTRAVENOUS | Status: AC
  Administered 2022-05-27: 300 mg via INTRAVENOUS
  Filled 2022-05-27: qty 300

## 2022-05-27 MED ORDER — SODIUM CHLORIDE 0.9 % IV SOLN: Freq: Once | INTRAVENOUS | Status: AC

## 2022-05-27 MED ORDER — CETIRIZINE HCL 10 MG PO TABS
10.0000 mg | ORAL_TABLET | Freq: Once | ORAL | Status: AC
  Administered 2022-05-27: 10 mg via ORAL
  Filled 2022-05-27: qty 1

## 2022-05-27 MED ORDER — ACETAMINOPHEN 325 MG PO TABS
650.0000 mg | ORAL_TABLET | Freq: Once | ORAL | Status: AC
  Administered 2022-05-27: 650 mg via ORAL
  Filled 2022-05-27: qty 2

## 2022-05-27 NOTE — Progress Notes (Signed)
Blood pressure elevated today. Patient did take his home BP meds today. Rojelio Brenner PA-C notified. Will monitor while here. No new orders.   Venofer iron infusion given per orders. Patient tolerated it well without problems. Vitals stable and discharged home from clinic ambulatory. Follow up as scheduled.

## 2022-05-27 NOTE — Patient Instructions (Signed)
[facility]  Discharge Instructions: Thank you for choosing [facility] to provide your oncology and hematology care.  If you have a lab appointment with the Cancer Center - please note that after April 8th, 2024, all labs will be drawn in the cancer center.  You do not have to check in or register with the main entrance as you have in the past but will complete your check-in in the cancer center.  Wear comfortable clothing and clothing appropriate for easy access to any Portacath or PICC line.   We strive to give you quality time with your provider. You may need to reschedule your appointment if you arrive late (15 or more minutes).  Arriving late affects you and other patients whose appointments are after yours.  Also, if you miss three or more appointments without notifying the office, you may be dismissed from the clinic at the provider's discretion.      For prescription refill requests, have your pharmacy contact our office and allow 72 hours for refills to be completed.    Today you received the following venofer iron infusion   To help prevent nausea and vomiting after your treatment, we encourage you to take your nausea medication as directed.  BELOW ARE SYMPTOMS THAT SHOULD BE REPORTED IMMEDIATELY: *FEVER GREATER THAN 100.4 F (38 C) OR HIGHER *CHILLS OR SWEATING *NAUSEA AND VOMITING THAT IS NOT CONTROLLED WITH YOUR NAUSEA MEDICATION *UNUSUAL SHORTNESS OF BREATH *UNUSUAL BRUISING OR BLEEDING *URINARY PROBLEMS (pain or burning when urinating, or frequent urination) *BOWEL PROBLEMS (unusual diarrhea, constipation, pain near the anus) TENDERNESS IN MOUTH AND THROAT WITH OR WITHOUT PRESENCE OF ULCERS (sore throat, sores in mouth, or a toothache) UNUSUAL RASH, SWELLING OR PAIN  UNUSUAL VAGINAL DISCHARGE OR ITCHING   Items with * indicate a potential emergency and should be followed up as soon as possible or go to the Emergency Department if any  problems should occur.  Please show the CHEMOTHERAPY ALERT CARD or IMMUNOTHERAPY ALERT CARD at check-in to the Emergency Department and triage nurse.  Should you have questions after your visit or need to cancel or reschedule your appointment, please contact [facility] [phone]  and follow the prompts.  Office hours are 8:00 a.m. to 4:30 p.m. Monday - Friday. Please note that voicemails left after 4:00 p.m. may not be returned until the following business day.  We are closed weekends and major holidays. You have access to a nurse at all times for urgent questions. Please call the main number to the clinic [phone] and follow the prompts.  For any non-urgent questions, you may also contact your provider using MyChart. We now offer e-Visits for anyone 18 and older to request care online for non-urgent symptoms. For details visit [email or web address].   Also download the MyChart app! Go to the app store, search "MyChart", open the app, select [facility], and log in with your MyChart username and password.   

## 2022-06-05 NOTE — Progress Notes (Deleted)
GI Office Note    Referring Provider: Benita Stabile, MD Primary Care Physician:  Benita Stabile, MD  Primary Gastroenterologist:  Chief Complaint   No chief complaint on file.   History of Present Illness   Michael [name] is a 67 y.o. male presenting today    male here for follow-up of IDA/multiple advanced adenomas in his right colon recently pancreatic ductal dilation on CT.   Patient was lost to follow-up  - came back to see [place] earlier in the year  - was noted to previously have multiple advanced adenomas in his right colon.  Repeat colonoscopy Dr. Marletta Lor confirmdc;  ultimately, patient saw Dr. Algis Greenhouse.  He underwent a right hemicolectomy.  Fortunately for him, no invasive cancer found -  only high-grade dysplasia.  He recovered from surgery uneventfully. He admitted for syncope and lightheadedness/hypoxemia.  The setting of COPD and chronic heart failure.  CT of the chest was negative for PE.  Pancreatic ductal calcification with dilation of the pancreatic duct noted.  A new finding.  He was referred here for further evaluation.  Patient denies a history of pancreatitis; endorses heavy alcohol use many many years ago.  CT showed calcium in his pancreas s/o chronic inflammation. AAA and right common iliac aneurysm and gallstones.   Advised to return in four months to set up surveillance colonoscopy.   AAA u/s every two years. ***NIC  Colononoscopy 07/2021: Reparation of colon fair Nonbleeding internal hemorrhoids Tattoo seen in prox trv colon. Likely malignant tumor in ascending colon Multiple 10-51mm polyps in ascending colon and cecum.  Bsx from mass showed tubulovillous adenoma, no cancer seen but could have not had good sampling.   Right hemicolectomy 07/2021: multiple tubular adenomas, several of which showed focal high-grade dysplasic, negative for invasive carcninoma.   Medications   Current Outpatient Medications  Medication Sig Dispense Refill    acetaminophen (TYLENOL) 650 MG CR tablet Take 1,300 mg by mouth as needed for pain.     albuterol (VENTOLIN HFA) 108 (90 Base) MCG/ACT inhaler Inhale 2 puffs into the lungs every 6 (six) hours as needed for wheezing or shortness of breath. 8 g 2   atorvastatin (LIPITOR) 80 MG tablet Take 1 tablet (80 mg total) by mouth every evening. 90 tablet 3   Budeson-Glycopyrrol-Formoterol (BREZTRI AEROSPHERE) 160-9-4.8 MCG/ACT AERO Inhale 2 puffs into the lungs 2 (two) times daily.     carvedilol (COREG) 3.125 MG tablet Take 1 tablet (3.125 mg total) by mouth 2 (two) times daily with a meal. 180 tablet 3   clopidogrel (PLAVIX) 75 MG tablet Take 1 tablet (75 mg total) by mouth daily. 90 tablet 3   furosemide (LASIX) 20 MG tablet Take 1 tablet (20 mg total) by mouth daily. For 3 days then take as needed for dyspnea, weight gain or lower extremity swelling. 90 tablet 3   isosorbide mononitrate (IMDUR) 60 MG 24 hr tablet Take 1 tablet (60 mg total) by mouth every evening. 90 tablet 3   No current facility-administered medications for this visit.    Allergies   Allergies as of 06/08/2022   (No Known Allergies)     Past Medical History   Past Medical History:  Diagnosis Date   Aortic atherosclerosis (HCC) 08/03/2021   CAD (coronary artery disease) 08/03/2021   Cardiac catheterization September 2023 with RCA/RV marginal stenosis managed medically   Cardiomyopathy (HCC)    Carotid artery disease (HCC)    CKD (chronic kidney disease) stage 3,  GFR 30-59 ml/min (HCC)    COPD (chronic obstructive pulmonary disease) (HCC)    Essential hypertension    Head and neck cancer (HCC) 2019   Right facial basal cell carcinoma s/p resection with right partial mastectomy and partal rhinectomy with skin graft (06/13/17)   Iron deficiency anemia    Urinary retention     Past Surgical History   Past Surgical History:  Procedure Laterality Date   ANKLE CLOSED REDUCTION Right    open reduction   BASAL CELL  CARCINOMA EXCISION  2019   at [facility]   BIOPSY  07/19/2021   Procedure: BIOPSY;  Surgeon: Lanelle Bal, DO;  [facility]: [facility];  Service: Endoscopy;;   COLONOSCOPY N/A 05/31/2017   Procedure: COLONOSCOPY;  Surgeon: Corbin Ade, MD;  Location: [facility];  Service: Endoscopy;  Laterality: N/A;  2:45pm   COLONOSCOPY WITH PROPOFOL N/A 07/19/2021   Procedure: COLONOSCOPY WITH PROPOFOL;  Surgeon: Lanelle Bal, DO;  [facility]: [facility];  Service: Endoscopy;  Laterality: N/A;  3:00pm, moved up to 9:00   CYSTOSCOPY N/A 10/29/2018   Procedure: CYSTOSCOPY, CLOT EVACUATION;  Surgeon: Rene Paci, MD;  Location: [facility];  Service: Urology;  Laterality: N/A;   PARTIAL COLECTOMY Right 08/11/2021   Procedure: PARTIAL COLECTOMY, OPEN RIGHT HEMICOLECTOMY;  Surgeon: Lucretia Roers, MD;  Location: [facility];  Service: General;  Laterality: Right;   POLYPECTOMY  05/31/2017   Procedure: POLYPECTOMY;  Surgeon: Corbin Ade, MD;  [facility]: [facility];  Service: Endoscopy;;   RIGHT/LEFT HEART CATH AND CORONARY ANGIOGRAPHY N/A 10/22/2021   Procedure: RIGHT/LEFT HEART CATH AND CORONARY ANGIOGRAPHY;  Surgeon: Orbie Pyo, MD;  Location: [facility];  Service: Cardiovascular;  Laterality: N/A;   TONSILLECTOMY     TRANSCAROTID ARTERY REVASCULARIZATION  Right 02/15/2021   Procedure: RIGHT TRANSCAROTID ARTERY REVASCULARIZATION;  Surgeon: Cephus Shelling, MD;  Location[facility];  Service: Vascular;  Laterality: Right;   ULTRASOUND GUIDANCE FOR VASCULAR ACCESS Left 02/15/2021   Procedure: ULTRASOUND GUIDANCE FOR VASCULAR ACCESS;  Surgeon: Cephus Shelling, MD;  Location[facility] OR;  Service: Vascular;  Laterality: Left;   XI ROBOTIC ASSISTED SIMPLE PROSTATECTOMY N/A 10/29/2018   Procedure: XI ROBOTIC ASSISTED SIMPLE PROSTATECTOMY;  Surgeon: Malen Gauze, MD;  Location: [facility];  Service: Urology;  Laterality: N/A;    Past Family History   Family History  Problem  Relation Age of Onset   Stroke Father    Cirrhosis Mother    Colon cancer Neg Hx     Past Social History   Social History   Socioeconomic History   Marital status: Single    Spouse name: Not on file   Number of children: Not on file   Years of education: Not on file   Highest education level: Not on file  Occupational History   Not on file  Tobacco Use   Smoking status: Former    Packs/day: 0.50    Years: 40.00    Additional pack years: 0.00    Total pack years: 20.00    Types: Cigarettes    Quit date: 07/02/2021    Years since quitting: 0.9    Passive exposure: Never   Smokeless tobacco: Never   Tobacco comments:    Quit on 06/09/21  Vaping Use   Vaping Use: Never used  Substance and Sexual Activity   Alcohol use: Not Currently   Drug use: Never   Sexual activity: Not Currently  Other  Topics Concern   Not on file  Social History Narrative   Not on file   Social Determinants of Health   Financial Resource Strain: Low Risk  (12/11/2019)   Overall Financial Resource Strain (CARDIA)    Difficulty of Paying Living Expenses: Not hard at all  Food Insecurity: No Food Insecurity (05/05/2022)   Hunger Vital Sign    Worried About Running Out of Food in the Last Year: Never true    Ran Out of Food in the Last Year: Never true  Transportation Needs: Unmet Transportation Needs (05/05/2022)   PRAPARE - Administrator, Civil Service (Medical): Yes    Lack of Transportation (Non-Medical): Yes  Physical Activity: Inactive (12/11/2019)   Exercise Vital Sign    Days of Exercise per Week: 0 days    Minutes of Exercise per Session: 0 min  Stress: No Stress Concern Present (12/11/2019)   [employer] of Occupational Health - Occupational Stress Questionnaire    Feeling of Stress : Not at all  Social Connections: Socially Isolated (12/11/2019)   Social Connection and Isolation Panel [NHANES]    Frequency of Communication with Friends and Family: Never    Frequency  of Social Gatherings with Friends and Family: Never    Attends Religious Services: Never    Database administrator or Organizations: No    Attends Banker Meetings: Never    Marital Status: Divorced  Catering manager Violence: Not At Risk (05/05/2022)   Humiliation, Afraid, Rape, and Kick questionnaire    Fear of Current or Ex-Partner: No    Emotionally Abused: No    Physically Abused: No    Sexually Abused: No    Review of Systems   General: Negative for anorexia, weight loss, fever, chills, fatigue, weakness. ENT: Negative for hoarseness, difficulty swallowing , nasal congestion. CV: Negative for chest pain, angina, palpitations, dyspnea on exertion, peripheral edema.  Respiratory: Negative for dyspnea at rest, dyspnea on exertion, cough, sputum, wheezing.  GI: See history of present illness. GU:  Negative for dysuria, hematuria, urinary incontinence, urinary frequency, nocturnal urination.  Endo: Negative for unusual weight change.     Physical Exam   There were no vitals taken for this visit.   General: Well-nourished, well-developed in no acute distress.  Eyes: No icterus. Mouth: Oropharyngeal mucosa moist and pink , no lesions erythema or exudate. Lungs: Clear to auscultation bilaterally.  Heart: Regular rate and rhythm, no murmurs rubs or gallops.  Abdomen: Bowel sounds are normal, nontender, nondistended, no hepatosplenomegaly or masses,  no abdominal bruits or hernia , no rebound or guarding.  Rectal: ***  Extremities: No lower extremity edema. No clubbing or deformities. Neuro: Alert and oriented x 4   Skin: Warm and dry, no jaundice.   Psych: Alert and cooperative, normal mood and affect.  Labs   *** Imaging Studies   DG Chest Portable 1 View  Result Date: 05/07/2022 CLINICAL DATA:  Difficulty breathing EXAM: PORTABLE CHEST 1 VIEW COMPARISON:  05/05/2022 FINDINGS: Cardiac shadow is stable. Lungs are well aerated bilaterally. No focal infiltrate or  sizable effusion is seen. No bony abnormality is noted. IMPRESSION: No acute abnormality noted. Electronically Signed   By: Alcide Clever M.D.   On: 05/07/2022 03:51    Assessment       PLAN   ***   Leanna Battles. Melvyn Neth, MHS, PA-C [facility]

## 2022-06-08 ENCOUNTER — Ambulatory Visit: Payer: Medicare Other | Admitting: Gastroenterology

## 2022-06-15 ENCOUNTER — Ambulatory Visit: Payer: 59 | Admitting: Urology

## 2022-06-15 DIAGNOSIS — N138 Other obstructive and reflux uropathy: Secondary | ICD-10-CM

## 2022-06-29 ENCOUNTER — Ambulatory Visit: Payer: 59 | Attending: Student | Admitting: Student

## 2022-06-29 ENCOUNTER — Encounter: Payer: Self-pay | Admitting: Student

## 2022-06-29 NOTE — Progress Notes (Deleted)
Cardiology Office Note    Date:  06/29/2022  ID:  Michael [name], DOB [birth year], MRN [id number] Cardiologist: Nona Dell, MD    History of Present Illness:    Michael [name] is a 67 y.o. male  with past medical history of HFrEF (EF 30% in 10/2021, at 35-40% in 04/2022), CAD (cath in 10/2021 showing mild to moderate obstructive CAD along mid-RCA and acute Mrg with medical management recommended), carotid artery stenosis (s/p right TCAR in 01/2021), iron deficiency anemia, HTN, HLD,  COPD and Stage 3 CKD who presents to the office today for 6-week follow-up.  He was examined by myself in 05/2022 following multiple admissions for acute CHF exacerbations with a syncopal episode and his recurrent presentations were felt to be driven by homelessness and medication noncompliance.  At the time of his visit, he reported overall doing well and had been taking medications routinely.  A majority of his medications for his cardiomyopathy have been discontinued during his admission given orthostatic hypotension and variable renal function.  He was restarted on Coreg 3.125 mg twice daily and continued on Lasix 20 mg daily and Imdur 60 mg daily.  Was recommended that pending his BP trend and renal function, would plan to restart Entresto or initiate an SGLT2 inhibitor at his next visit.  ROS: ***  Studies Reviewed:   EKG: EKG is*** ordered today and demonstrates ***  R/LHC: 10/2021 Mid RCA lesion is 70% stenosed.   Acute Mrg lesion is 80% stenosed.   1.  Mild to moderate obstructive coronary artery disease that is outweighed by the patient's degree of LV dysfunction.  There is a significant bifurcation lesion of the right coronary artery and large RV marginal branch.  This is moderately calcified and relatively tortuous.  Given the patient's lack of chest pain, medical therapy should be pursued. 2.  Cardiac output of 6.3 L/min, cardiac index of 3.3 L/min/m, mean RA pressure of 16 mmHg, mean wedge  pressure of 21 mmHg, and LVEDP of 21 mmHg.   Recommendation: Goal-directed medical therapy with augmentation of diuretics; consider dual antiplatelet therapy for 1 year for medical treatment of acute coronary syndrome.  Echocardiogram: 04/2022 IMPRESSIONS     1. Left ventricular ejection fraction, by estimation, is 35 to 40%. The  left ventricle has moderately decreased function. The left ventricle  demonstrates global hypokinesis. Left ventricular diastolic parameters are  indeterminate.   2. Right ventricular systolic function is normal. The right ventricular  size is normal. There is normal pulmonary artery systolic pressure.   3. The mitral valve is normal in structure. No evidence of mitral valve  regurgitation. No evidence of mitral stenosis.   4. The aortic valve has an indeterminant number of cusps. There is mild  calcification of the aortic valve. There is mild thickening of the aortic  valve. Aortic valve regurgitation is not visualized. No aortic stenosis is  present.   5. The inferior vena cava is normal in size with greater than 50%  respiratory variability, suggesting right atrial pressure of 3 mmHg.   Risk Assessment/Calculations:   {Does this patient have ATRIAL FIBRILLATION?:217 [id number] [id number]} No BP recorded.  {Refresh Note OR Click here to enter BP  :1}***         Physical Exam:   VS:  There were no vitals taken for this visit.   Wt Readings from Last 3 Encounters:  05/13/22 160 lb (72.6 kg)  05/09/22 157 lb 14.4 oz (71.6 kg)  05/07/22 149 lb 4 oz (67.7 kg)  GEN: Well nourished, well developed in no acute distress NECK: No JVD; No carotid bruits CARDIAC: ***RRR, no murmurs, rubs, gallops RESPIRATORY:  Clear to auscultation without rales, wheezing or rhonchi  ABDOMEN: Appears non-distended. No obvious abdominal masses. EXTREMITIES: No clubbing or cyanosis. No edema.  Distal pedal pulses are 2+ bilaterally.   Assessment and Plan:   1. Chronic HFrEF -  ***  2. CAD - ***  3. HTN - ***  4. HLD - His LDL was 64 in 07/2021. Continue Atorvastatin 80 mg daily.  5. Stage 3 CKD - Baseline creatinine 1.7 - 1.9. Was at 1.84 in 05/2022.  Signed, [facility], PA-C

## 2022-06-30 ENCOUNTER — Encounter: Payer: Self-pay | Admitting: Student

## 2022-07-19 ENCOUNTER — Ambulatory Visit (HOSPITAL_COMMUNITY)
Admission: RE | Admit: 2022-07-19 | Discharge: 2022-07-19 | Disposition: A | Discharge status: Home / Self Care | Payer: 59 | Source: Ambulatory Visit | Attending: Family Medicine | Admitting: Family Medicine

## 2022-07-19 ENCOUNTER — Other Ambulatory Visit (HOSPITAL_COMMUNITY): Payer: Self-pay | Admitting: Family Medicine

## 2022-07-19 DIAGNOSIS — R0602 Shortness of breath: Secondary | ICD-10-CM | POA: Diagnosis not present

## 2022-07-19 DIAGNOSIS — J449 Chronic obstructive pulmonary disease, unspecified: Secondary | ICD-10-CM

## 2022-07-19 DIAGNOSIS — F17210 Nicotine dependence, cigarettes, uncomplicated: Secondary | ICD-10-CM | POA: Diagnosis not present

## 2022-07-19 DIAGNOSIS — R0682 Tachypnea, not elsewhere classified: Secondary | ICD-10-CM | POA: Diagnosis not present

## 2022-07-19 DIAGNOSIS — Z7951 Long term (current) use of inhaled steroids: Secondary | ICD-10-CM | POA: Diagnosis not present

## 2022-07-19 DIAGNOSIS — Z741 Need for assistance with personal care: Secondary | ICD-10-CM | POA: Diagnosis not present

## 2022-07-20 ENCOUNTER — Other Ambulatory Visit: Payer: Self-pay | Admitting: Family Medicine

## 2022-07-20 DIAGNOSIS — R7401 Elevation of levels of liver transaminase levels: Secondary | ICD-10-CM

## 2022-07-21 ENCOUNTER — Ambulatory Visit (HOSPITAL_COMMUNITY)
Admission: RE | Admit: 2022-07-21 | Discharge: 2022-07-21 | Disposition: A | Discharge status: Home / Self Care | Payer: 59 | Source: Ambulatory Visit | Attending: Family Medicine | Admitting: Family Medicine

## 2022-07-21 DIAGNOSIS — R54 Age-related physical debility: Secondary | ICD-10-CM | POA: Diagnosis not present

## 2022-07-21 DIAGNOSIS — D509 Iron deficiency anemia, unspecified: Secondary | ICD-10-CM | POA: Diagnosis not present

## 2022-07-21 DIAGNOSIS — Z79899 Other long term (current) drug therapy: Secondary | ICD-10-CM | POA: Diagnosis not present

## 2022-07-21 DIAGNOSIS — I472 Ventricular tachycardia, unspecified: Secondary | ICD-10-CM | POA: Diagnosis not present

## 2022-07-21 DIAGNOSIS — I110 Hypertensive heart disease with heart failure: Secondary | ICD-10-CM | POA: Diagnosis not present

## 2022-07-21 DIAGNOSIS — I2510 Atherosclerotic heart disease of native coronary artery without angina pectoris: Secondary | ICD-10-CM | POA: Diagnosis not present

## 2022-07-21 DIAGNOSIS — Z823 Family history of stroke: Secondary | ICD-10-CM | POA: Diagnosis not present

## 2022-07-21 DIAGNOSIS — I428 Other cardiomyopathies: Secondary | ICD-10-CM | POA: Diagnosis not present

## 2022-07-21 DIAGNOSIS — R0602 Shortness of breath: Secondary | ICD-10-CM | POA: Diagnosis not present

## 2022-07-21 DIAGNOSIS — J441 Chronic obstructive pulmonary disease with (acute) exacerbation: Secondary | ICD-10-CM | POA: Diagnosis not present

## 2022-07-21 DIAGNOSIS — E876 Hypokalemia: Secondary | ICD-10-CM | POA: Diagnosis not present

## 2022-07-21 DIAGNOSIS — Z9049 Acquired absence of other specified parts of digestive tract: Secondary | ICD-10-CM | POA: Diagnosis not present

## 2022-07-21 DIAGNOSIS — J9621 Acute and chronic respiratory failure with hypoxia: Secondary | ICD-10-CM | POA: Diagnosis not present

## 2022-07-21 DIAGNOSIS — I130 Hypertensive heart and chronic kidney disease with heart failure and stage 1 through stage 4 chronic kidney disease, or unspecified chronic kidney disease: Secondary | ICD-10-CM | POA: Diagnosis not present

## 2022-07-21 DIAGNOSIS — R945 Abnormal results of liver function studies: Secondary | ICD-10-CM | POA: Diagnosis not present

## 2022-07-21 DIAGNOSIS — E785 Hyperlipidemia, unspecified: Secondary | ICD-10-CM | POA: Diagnosis not present

## 2022-07-21 DIAGNOSIS — I6521 Occlusion and stenosis of right carotid artery: Secondary | ICD-10-CM | POA: Diagnosis not present

## 2022-07-21 DIAGNOSIS — N179 Acute kidney failure, unspecified: Secondary | ICD-10-CM | POA: Diagnosis not present

## 2022-07-21 DIAGNOSIS — Z1152 Encounter for screening for COVID-19: Secondary | ICD-10-CM | POA: Diagnosis not present

## 2022-07-21 DIAGNOSIS — Z91148 Patient's other noncompliance with medication regimen for other reason: Secondary | ICD-10-CM | POA: Diagnosis not present

## 2022-07-21 DIAGNOSIS — Z85828 Personal history of other malignant neoplasm of skin: Secondary | ICD-10-CM | POA: Diagnosis not present

## 2022-07-21 DIAGNOSIS — I5023 Acute on chronic systolic (congestive) heart failure: Secondary | ICD-10-CM | POA: Diagnosis not present

## 2022-07-21 DIAGNOSIS — Z7951 Long term (current) use of inhaled steroids: Secondary | ICD-10-CM | POA: Diagnosis not present

## 2022-07-21 DIAGNOSIS — I951 Orthostatic hypotension: Secondary | ICD-10-CM | POA: Diagnosis not present

## 2022-07-21 DIAGNOSIS — Z87891 Personal history of nicotine dependence: Secondary | ICD-10-CM | POA: Diagnosis not present

## 2022-07-21 DIAGNOSIS — N1832 Chronic kidney disease, stage 3b: Secondary | ICD-10-CM | POA: Diagnosis not present

## 2022-07-21 DIAGNOSIS — R918 Other nonspecific abnormal finding of lung field: Secondary | ICD-10-CM | POA: Diagnosis not present

## 2022-07-21 DIAGNOSIS — R7401 Elevation of levels of liver transaminase levels: Secondary | ICD-10-CM

## 2022-07-21 DIAGNOSIS — Z7902 Long term (current) use of antithrombotics/antiplatelets: Secondary | ICD-10-CM | POA: Diagnosis not present

## 2022-07-22 ENCOUNTER — Other Ambulatory Visit: Payer: Self-pay | Admitting: *Deleted

## 2022-07-22 DIAGNOSIS — K8020 Calculus of gallbladder without cholecystitis without obstruction: Secondary | ICD-10-CM

## 2022-07-24 ENCOUNTER — Emergency Department (HOSPITAL_COMMUNITY): Payer: 59

## 2022-07-24 ENCOUNTER — Inpatient Hospital Stay (HOSPITAL_COMMUNITY)
Admission: EM | Admit: 2022-07-24 | Discharge: 2022-07-26 | DRG: 291 | Disposition: A | Discharge status: Home Health | Payer: 59 | Attending: Family Medicine | Admitting: Family Medicine

## 2022-07-24 ENCOUNTER — Other Ambulatory Visit: Payer: Self-pay

## 2022-07-24 DIAGNOSIS — C44111 Basal cell carcinoma of skin of unspecified eyelid, including canthus: Secondary | ICD-10-CM | POA: Diagnosis present

## 2022-07-24 DIAGNOSIS — I5043 Acute on chronic combined systolic (congestive) and diastolic (congestive) heart failure: Secondary | ICD-10-CM | POA: Diagnosis present

## 2022-07-24 DIAGNOSIS — E876 Hypokalemia: Secondary | ICD-10-CM | POA: Diagnosis present

## 2022-07-24 DIAGNOSIS — N179 Acute kidney failure, unspecified: Secondary | ICD-10-CM | POA: Diagnosis present

## 2022-07-24 DIAGNOSIS — Z91148 Patient's other noncompliance with medication regimen for other reason: Secondary | ICD-10-CM

## 2022-07-24 DIAGNOSIS — I5023 Acute on chronic systolic (congestive) heart failure: Secondary | ICD-10-CM | POA: Diagnosis present

## 2022-07-24 DIAGNOSIS — Z9049 Acquired absence of other specified parts of digestive tract: Secondary | ICD-10-CM

## 2022-07-24 DIAGNOSIS — I10 Essential (primary) hypertension: Secondary | ICD-10-CM | POA: Diagnosis not present

## 2022-07-24 DIAGNOSIS — Z79899 Other long term (current) drug therapy: Secondary | ICD-10-CM | POA: Diagnosis not present

## 2022-07-24 DIAGNOSIS — I428 Other cardiomyopathies: Secondary | ICD-10-CM | POA: Diagnosis present

## 2022-07-24 DIAGNOSIS — D509 Iron deficiency anemia, unspecified: Secondary | ICD-10-CM | POA: Diagnosis present

## 2022-07-24 DIAGNOSIS — E785 Hyperlipidemia, unspecified: Secondary | ICD-10-CM | POA: Diagnosis present

## 2022-07-24 DIAGNOSIS — Z7951 Long term (current) use of inhaled steroids: Secondary | ICD-10-CM

## 2022-07-24 DIAGNOSIS — I130 Hypertensive heart and chronic kidney disease with heart failure and stage 1 through stage 4 chronic kidney disease, or unspecified chronic kidney disease: Secondary | ICD-10-CM | POA: Diagnosis present

## 2022-07-24 DIAGNOSIS — I5042 Chronic combined systolic (congestive) and diastolic (congestive) heart failure: Secondary | ICD-10-CM | POA: Diagnosis present

## 2022-07-24 DIAGNOSIS — Z87891 Personal history of nicotine dependence: Secondary | ICD-10-CM | POA: Diagnosis not present

## 2022-07-24 DIAGNOSIS — J9621 Acute and chronic respiratory failure with hypoxia: Secondary | ICD-10-CM | POA: Diagnosis present

## 2022-07-24 DIAGNOSIS — I779 Disorder of arteries and arterioles, unspecified: Secondary | ICD-10-CM | POA: Diagnosis present

## 2022-07-24 DIAGNOSIS — R7989 Other specified abnormal findings of blood chemistry: Secondary | ICD-10-CM | POA: Diagnosis not present

## 2022-07-24 DIAGNOSIS — C44311 Basal cell carcinoma of skin of nose: Secondary | ICD-10-CM | POA: Diagnosis present

## 2022-07-24 DIAGNOSIS — I2510 Atherosclerotic heart disease of native coronary artery without angina pectoris: Secondary | ICD-10-CM | POA: Diagnosis not present

## 2022-07-24 DIAGNOSIS — I493 Ventricular premature depolarization: Secondary | ICD-10-CM | POA: Diagnosis present

## 2022-07-24 DIAGNOSIS — N189 Chronic kidney disease, unspecified: Secondary | ICD-10-CM | POA: Diagnosis present

## 2022-07-24 DIAGNOSIS — Z85828 Personal history of other malignant neoplasm of skin: Secondary | ICD-10-CM | POA: Diagnosis not present

## 2022-07-24 DIAGNOSIS — I951 Orthostatic hypotension: Secondary | ICD-10-CM | POA: Diagnosis present

## 2022-07-24 DIAGNOSIS — Z7189 Other specified counseling: Secondary | ICD-10-CM | POA: Diagnosis not present

## 2022-07-24 DIAGNOSIS — Z1152 Encounter for screening for COVID-19: Secondary | ICD-10-CM | POA: Diagnosis not present

## 2022-07-24 DIAGNOSIS — Z5982 Transportation insecurity: Secondary | ICD-10-CM

## 2022-07-24 DIAGNOSIS — J441 Chronic obstructive pulmonary disease with (acute) exacerbation: Secondary | ICD-10-CM | POA: Diagnosis present

## 2022-07-24 DIAGNOSIS — I472 Ventricular tachycardia, unspecified: Secondary | ICD-10-CM | POA: Diagnosis present

## 2022-07-24 DIAGNOSIS — I6529 Occlusion and stenosis of unspecified carotid artery: Secondary | ICD-10-CM | POA: Diagnosis not present

## 2022-07-24 DIAGNOSIS — D638 Anemia in other chronic diseases classified elsewhere: Secondary | ICD-10-CM | POA: Diagnosis present

## 2022-07-24 DIAGNOSIS — Z7902 Long term (current) use of antithrombotics/antiplatelets: Secondary | ICD-10-CM

## 2022-07-24 DIAGNOSIS — I110 Hypertensive heart disease with heart failure: Secondary | ICD-10-CM | POA: Diagnosis not present

## 2022-07-24 DIAGNOSIS — R54 Age-related physical debility: Secondary | ICD-10-CM | POA: Diagnosis present

## 2022-07-24 DIAGNOSIS — D649 Anemia, unspecified: Secondary | ICD-10-CM | POA: Diagnosis present

## 2022-07-24 DIAGNOSIS — N1832 Chronic kidney disease, stage 3b: Secondary | ICD-10-CM | POA: Diagnosis present

## 2022-07-24 DIAGNOSIS — Z823 Family history of stroke: Secondary | ICD-10-CM | POA: Diagnosis not present

## 2022-07-24 DIAGNOSIS — R918 Other nonspecific abnormal finding of lung field: Secondary | ICD-10-CM | POA: Diagnosis not present

## 2022-07-24 DIAGNOSIS — Z515 Encounter for palliative care: Secondary | ICD-10-CM | POA: Diagnosis not present

## 2022-07-24 DIAGNOSIS — N183 Chronic kidney disease, stage 3 unspecified: Secondary | ICD-10-CM | POA: Diagnosis present

## 2022-07-24 DIAGNOSIS — R0602 Shortness of breath: Secondary | ICD-10-CM | POA: Diagnosis not present

## 2022-07-24 DIAGNOSIS — J9601 Acute respiratory failure with hypoxia: Secondary | ICD-10-CM | POA: Diagnosis present

## 2022-07-24 DIAGNOSIS — I5022 Chronic systolic (congestive) heart failure: Secondary | ICD-10-CM | POA: Diagnosis present

## 2022-07-24 DIAGNOSIS — I6521 Occlusion and stenosis of right carotid artery: Secondary | ICD-10-CM | POA: Diagnosis present

## 2022-07-24 LAB — CBC WITH DIFFERENTIAL/PLATELET
Abs Immature Granulocytes: 0.06 10*3/uL (ref 0.00–0.07)
Basophils Absolute: 0.1 10*3/uL (ref 0.0–0.1)
Basophils Relative: 1 %
Eosinophils Absolute: 0 10*3/uL (ref 0.0–0.5)
Eosinophils Relative: 0 %
HCT: 41.7 % (ref 39.0–52.0)
Hemoglobin: 13.1 g/dL (ref 13.0–17.0)
Immature Granulocytes: 1 %
Lymphocytes Relative: 8 %
Lymphs Abs: 1.1 10*3/uL (ref 0.7–4.0)
MCH: 28.8 pg (ref 26.0–34.0)
MCHC: 31.4 g/dL (ref 30.0–36.0)
MCV: 91.6 fL (ref 80.0–100.0)
Monocytes Absolute: 1.4 10*3/uL — ABNORMAL HIGH (ref 0.1–1.0)
Monocytes Relative: 12 %
Neutro Abs: 9.8 10*3/uL — ABNORMAL HIGH (ref 1.7–7.7)
Neutrophils Relative %: 78 %
Platelets: 270 10*3/uL (ref 150–400)
RBC: 4.55 MIL/uL (ref 4.22–5.81)
RDW: 15.1 % (ref 11.5–15.5)
WBC: 12.5 10*3/uL — ABNORMAL HIGH (ref 4.0–10.5)
nRBC: 0 % (ref 0.0–0.2)

## 2022-07-24 LAB — COMPREHENSIVE METABOLIC PANEL
ALT: 216 U/L — ABNORMAL HIGH (ref 0–44)
AST: 56 U/L — ABNORMAL HIGH (ref 15–41)
Albumin: 3 g/dL — ABNORMAL LOW (ref 3.5–5.0)
Alkaline Phosphatase: 105 U/L (ref 38–126)
Anion gap: 11 (ref 5–15)
BUN: 44 mg/dL — ABNORMAL HIGH (ref 8–23)
CO2: 21 mmol/L — ABNORMAL LOW (ref 22–32)
Calcium: 8.2 mg/dL — ABNORMAL LOW (ref 8.9–10.3)
Chloride: 106 mmol/L (ref 98–111)
Creatinine, Ser: 2.33 mg/dL — ABNORMAL HIGH (ref 0.61–1.24)
GFR, Estimated: 30 mL/min — ABNORMAL LOW (ref >60)
Glucose, Bld: 148 mg/dL — ABNORMAL HIGH (ref 70–99)
Potassium: 4.2 mmol/L (ref 3.5–5.1)
Sodium: 138 mmol/L (ref 135–145)
Total Bilirubin: 2.8 mg/dL — ABNORMAL HIGH (ref 0.3–1.2)
Total Protein: 6.3 g/dL — ABNORMAL LOW (ref 6.5–8.1)

## 2022-07-24 LAB — MRSA NEXT GEN BY PCR, NASAL: MRSA by PCR Next Gen: NOT DETECTED

## 2022-07-24 LAB — LIPID PANEL
Cholesterol: 62 mg/dL (ref 0–200)
HDL: 32 mg/dL — ABNORMAL LOW (ref >40)
LDL Cholesterol: 18 mg/dL (ref 0–99)
Total CHOL/HDL Ratio: 1.9 RATIO
Triglycerides: 60 mg/dL (ref <150)
VLDL: 12 mg/dL (ref 0–40)

## 2022-07-24 LAB — PHOSPHORUS: Phosphorus: 3.5 mg/dL (ref 2.5–4.6)

## 2022-07-24 LAB — RESP PANEL BY RT-PCR (RSV, FLU A&B, COVID)  RVPGX2
Influenza A by PCR: NEGATIVE
Influenza B by PCR: NEGATIVE
Resp Syncytial Virus by PCR: NEGATIVE
SARS Coronavirus 2 by RT PCR: NEGATIVE

## 2022-07-24 LAB — MAGNESIUM: Magnesium: 2 mg/dL (ref 1.7–2.4)

## 2022-07-24 LAB — BRAIN NATRIURETIC PEPTIDE: B Natriuretic Peptide: >4500 pg/mL — ABNORMAL HIGH (ref 0.0–100.0)

## 2022-07-24 LAB — PROTIME-INR
INR: 1.5 — ABNORMAL HIGH (ref 0.8–1.2)
Prothrombin Time: 18.5 seconds — ABNORMAL HIGH (ref 11.4–15.2)

## 2022-07-24 MED ORDER — FUROSEMIDE 10 MG/ML IJ SOLN: 40.0000 mg | Freq: Two times a day (BID) | INTRAMUSCULAR | Status: DC

## 2022-07-24 MED ORDER — ACETAMINOPHEN 325 MG PO TABS: 650.0000 mg | ORAL_TABLET | Freq: Four times a day (QID) | ORAL | Status: DC | PRN

## 2022-07-24 MED ORDER — HEPARIN SODIUM (PORCINE) 5000 UNIT/ML IJ SOLN
5000.0000 [IU] | Freq: Three times a day (TID) | INTRAMUSCULAR | Status: DC
  Administered 2022-07-24 – 2022-07-26 (×6): 5000 [IU] via SUBCUTANEOUS
  Filled 2022-07-24 (×6): qty 1

## 2022-07-24 MED ORDER — SODIUM CHLORIDE 0.9% FLUSH: 3.0000 mL | Freq: Two times a day (BID) | INTRAVENOUS | Status: DC

## 2022-07-24 MED ORDER — SODIUM CHLORIDE 0.9% FLUSH
3.0000 mL | Freq: Two times a day (BID) | INTRAVENOUS | Status: DC
  Administered 2022-07-24 – 2022-07-26 (×5): 3 mL via INTRAVENOUS

## 2022-07-24 MED ORDER — ONDANSETRON HCL 4 MG PO TABS: 4.0000 mg | ORAL_TABLET | Freq: Four times a day (QID) | ORAL | Status: DC | PRN

## 2022-07-24 MED ORDER — CARVEDILOL 3.125 MG PO TABS
3.1250 mg | ORAL_TABLET | Freq: Two times a day (BID) | ORAL | Status: DC
  Administered 2022-07-24 – 2022-07-26 (×4): 3.125 mg via ORAL
  Filled 2022-07-24 (×4): qty 1

## 2022-07-24 MED ORDER — FUROSEMIDE 10 MG/ML IJ SOLN
80.0000 mg | Freq: Once | INTRAMUSCULAR | Status: AC
  Administered 2022-07-24: 80 mg via INTRAVENOUS
  Filled 2022-07-24: qty 8

## 2022-07-24 MED ORDER — SENNOSIDES-DOCUSATE SODIUM 8.6-50 MG PO TABS: 1.0000 | ORAL_TABLET | Freq: Every evening | ORAL | Status: DC | PRN

## 2022-07-24 MED ORDER — BISACODYL 5 MG PO TBEC: 5.0000 mg | DELAYED_RELEASE_TABLET | Freq: Every day | ORAL | Status: DC | PRN

## 2022-07-24 MED ORDER — ONDANSETRON HCL 4 MG/2ML IJ SOLN: 4.0000 mg | Freq: Four times a day (QID) | INTRAMUSCULAR | Status: DC | PRN

## 2022-07-24 MED ORDER — ORAL CARE MOUTH RINSE: 15.0000 mL | OROMUCOSAL | Status: DC | PRN

## 2022-07-24 MED ORDER — FLEET ENEMA 7-19 GM/118ML RE ENEM: 1.0000 | ENEMA | Freq: Once | RECTAL | Status: DC | PRN

## 2022-07-24 MED ORDER — LEVALBUTEROL HCL 0.63 MG/3ML IN NEBU: 0.6300 mg | INHALATION_SOLUTION | Freq: Four times a day (QID) | RESPIRATORY_TRACT | Status: DC | PRN

## 2022-07-24 MED ORDER — ATORVASTATIN CALCIUM 40 MG PO TABS: 40.0000 mg | ORAL_TABLET | Freq: Every evening | ORAL | Status: DC

## 2022-07-24 MED ORDER — HYDRALAZINE HCL 20 MG/ML IJ SOLN: 10.0000 mg | INTRAMUSCULAR | Status: DC | PRN

## 2022-07-24 MED ORDER — ATORVASTATIN CALCIUM 40 MG PO TABS: 80.0000 mg | ORAL_TABLET | Freq: Every evening | ORAL | Status: DC

## 2022-07-24 MED ORDER — ISOSORBIDE MONONITRATE ER 60 MG PO TB24
60.0000 mg | ORAL_TABLET | Freq: Every evening | ORAL | Status: DC
  Administered 2022-07-24 – 2022-07-25 (×2): 60 mg via ORAL
  Filled 2022-07-24 (×2): qty 1

## 2022-07-24 MED ORDER — SODIUM CHLORIDE 0.9 % IV SOLN: 250.0000 mL | INTRAVENOUS | Status: DC | PRN

## 2022-07-24 MED ORDER — CHLORHEXIDINE GLUCONATE CLOTH 2 % EX PADS
6.0000 | MEDICATED_PAD | Freq: Every day | CUTANEOUS | Status: DC
  Administered 2022-07-25: 6 via TOPICAL

## 2022-07-24 MED ORDER — HYDROMORPHONE HCL 1 MG/ML IJ SOLN: 0.5000 mg | INTRAMUSCULAR | Status: DC | PRN

## 2022-07-24 MED ORDER — SODIUM CHLORIDE 0.9% FLUSH: 3.0000 mL | INTRAVENOUS | Status: DC | PRN

## 2022-07-24 MED ORDER — ACETAMINOPHEN 650 MG RE SUPP: 650.0000 mg | Freq: Four times a day (QID) | RECTAL | Status: DC | PRN

## 2022-07-24 MED ORDER — IPRATROPIUM BROMIDE 0.02 % IN SOLN: 0.5000 mg | Freq: Four times a day (QID) | RESPIRATORY_TRACT | Status: DC | PRN

## 2022-07-24 MED ORDER — OXYCODONE HCL 5 MG PO TABS: 5.0000 mg | ORAL_TABLET | ORAL | Status: DC | PRN

## 2022-07-24 MED ORDER — CLOPIDOGREL BISULFATE 75 MG PO TABS
75.0000 mg | ORAL_TABLET | Freq: Every day | ORAL | Status: DC
  Administered 2022-07-24 – 2022-07-26 (×3): 75 mg via ORAL
  Filled 2022-07-24 (×3): qty 1

## 2022-07-24 MED ORDER — TRAZODONE HCL 50 MG PO TABS: 25.0000 mg | ORAL_TABLET | Freq: Every evening | ORAL | Status: DC | PRN

## 2022-07-24 NOTE — Hospital Course (Addendum)
Michael [name] Is a 67 year old male with a history of HFrEF, (EJF35-40% ): CKD stage III, coronary disease, COPD, tobacco abuse, basal cell carcinoma status post partial rhinectomy and maxillectomy 06/06/2017 at [facility], advanced colonic tubular adenoma, right carotid stenosis, hypertension, presenting with shortness of breath. Apparently patient was diagnosed with pneumonia last week, has been taking his antibiotics, but he is progressively getting more shortness of breath past 3-4 days.  Reporting shortness of breath at rest, and with minimal exertion gets worse.  Multiple hospitalization in past 2 months for shortness of breath, COPD exacerbation Hospitalization, recent discharge 05/06/2022, also admission and discharged 04/29/2018 for COPD exacerbation, shortness of breath, and syncope.  ED evaluation/course Blood pressure (!) 148/134, pulse (!) 104, temperature 97.8 F (36.6 C), RR  17, weight 66.5 kg, SpO2 98 % on 4 L  CBC: WBC 12.5, CMP BUN 44 creatinine 2.33, calcium 8.2, glucose 148,  Chronically elevated BNP.Marland Kitchen. now > 40000,   Patient Denies having: Fever, Chills,  Abd pain, N/V/D, headache, dizziness, lightheadedness,  Dysuria, Joint pain, rash, open wounds    Assessment / Plan:   Principal Problem:   Acute on chronic systolic CHF (congestive heart failure) (HCC) Active Problems:   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   Essential hypertension   Anemia   Acute hypoxemic respiratory failure (HCC)   COPD with acute exacerbation (HCC)   Acute renal failure superimposed on chronic kidney disease (HCC)   Chronic HFrEF (heart failure with reduced ejection fraction) (HCC)   Carotid stenosis, asymptomatic, right   CAD (coronary artery disease)   Chronic kidney disease, stage 3b (HCC) Acute on chronic respiratory failure -Multifactorial including mild COPD exacerbation, HFrEF, and deconditioning -This presentation more consistent with CHF exacerbation -BNP>4500 Filed Weights    07/24/22 1103  Weight: 66.5 kg   -Chest x-ray:  Acute on chronic interstitial finding layering left pleural effusionwithout any acute infiltrates -Patient has received 80 mg of IV Lasix  -Continue as needed DuoNeb bronchodilators, Pulmicort, -Patient not wheezing, but holding IV steroids -Currently requiring 4 L of oxygen 98%   Chronic HFrEF -acute on chronic systolic congestive heart failure -Entresto and carvedilol was discontinued during his last hospitalization -04/30/2022 echo EF 35-40%, global HK, normal PASP, mild TR  -on the last admission carvedilol was discontinued due to persistent orthostasis -Patient was received IV Lasix 80 mg in ED -Will continue with IV Lasix 40 mg IV twice daily -Resuming Imdur, Coreg  -Will monitor daily weight, I's and O's -BNP >4500 >>> - ReDs Clip >>    Acute on chronic CKD stage IIIb -Baseline creatinine 1.7-2.0 -serum creatinine was 1.97 on last discharge Lab Results  Component Value Date   CREATININE 2.33 (H) 07/24/2022   CREATININE 1.84 (H) 05/07/2022   CREATININE 1.97 (H) 05/06/2022   Will monitor closely   Essential hypertension -holding coreg as discussed above As needed hydralazine   Tobacco abuse -Tobacco cessation discussed   Hyperlipidemia  -Continue high-dose statins   Right Carotid stenosis -Will continue high-dose statins, Plavix -The patient has follow-up with Dr. Leonette Most fields -05/12/2017 carotid ultrasound shows 80-99% right carotid stenosis -70% heavily calcified RICA stenosis and no significant left-sided disease by CT angiography.   - He is now following with Dr. Olga Coaster was not felt to be a candidate for carotid stenting and suboptimal candidate for endarterectomy.     Basal cell carcinoma of nasolabial fold -06/06/2017--right partial rhinectomy, resection of left intranasal lesion, skin graft @[facility] -Margins were clear on pathology -Most recent  follow-up was 06/23/2017   Iron deficiency  anemia/Advanced colonic adenoma -The patient had colonoscopy 05/31/2017--Dr. Rourk -He was noted to have numerous polyps as well as a polypoid mass in the hepatic flexure to the cecum area -pathology of mass--tubular adenoma -He was referred to general surgery, Dr. Larae Grooms for hemicolectomy -He has been lost to follow-up -noncompliant with po iron     Latest Ref Rng & Units 07/24/2022   11:30 AM 05/07/2022    3:38 AM 05/05/2022    2:41 AM  CBC  WBC 4.0 - 10.5 K/uL 12.5  12.8  9.9   Hemoglobin 13.0 - 17.0 g/dL 40.9  81.1  91.4   Hematocrit 39.0 - 52.0 % 41.7  41.3  46.4   Platelets 150 - 400 K/uL 270  PLATELETS APPEAR ADEQUATE  273

## 2022-07-24 NOTE — ED Provider Notes (Signed)
[place] [facility] AT [facility] Provider Note   CSN: [id number] Arrival date & time: 07/24/22  1022     History  Chief Complaint  Patient presents with   Shortness of Breath    Michael [name] is a 67 y.o. male.  HPI Patient presents with shortness of breath, chest pain.  Patient acknowledges multiple medical problems including CAD, CHF, states that he was diagnosed with pneumonia last week.  He has been taking his antibiotics.  Over the past 3 to 4 days he has become more short of breath, with increasing chest discomfort.  No reported fever, vomiting.     Home Medications Prior to Admission medications   Medication Sig Start Date End Date Taking? Authorizing Provider  acetaminophen (TYLENOL) 650 MG CR tablet Take 1,300 mg by mouth as needed for pain.    [provider]  albuterol (VENTOLIN HFA) 108 (90 Base) MCG/ACT inhaler Inhale 2 puffs into the lungs every 6 (six) hours as needed for wheezing or shortness of breath. 04/27/22   Shahmehdi, Gemma Payor, MD  atorvastatin (LIPITOR) 80 MG tablet Take 1 tablet (80 mg total) by mouth every evening. 05/13/22   Strader, Lennart Pall, PA-C  Budeson-Glycopyrrol-Formoterol (BREZTRI AEROSPHERE) 160-9-4.8 MCG/ACT AERO Inhale 2 puffs into the lungs 2 (two) times daily.    [provider]  carvedilol (COREG) 3.125 MG tablet Take 1 tablet (3.125 mg total) by mouth 2 (two) times daily with a meal. 05/13/22   Strader, [place] M, PA-C  clopidogrel (PLAVIX) 75 MG tablet Take 1 tablet (75 mg total) by mouth daily. 05/13/22   Strader, Lennart Pall, PA-C  furosemide (LASIX) 20 MG tablet Take 1 tablet (20 mg total) by mouth daily. For 3 days then take as needed for dyspnea, weight gain or lower extremity swelling. 05/13/22   Strader, Lennart Pall, PA-C  isosorbide mononitrate (IMDUR) 60 MG 24 hr tablet Take 1 tablet (60 mg total) by mouth every evening. 05/13/22   Ellsworth Lennox, PA-C      Allergies    Patient has no  known allergies.    Review of Systems   Review of Systems  All other systems reviewed and are negative.   Physical Exam Updated Vital Signs BP (!) 148/134 (BP Location: Right Arm)   Pulse (!) 104   Temp 97.8 F (36.6 C)   Resp 17   Ht 6' (1.829 m)   Wt 66.5 kg   SpO2 98%   BMI 19.88 kg/m  Physical Exam Vitals and nursing note reviewed.  Constitutional:      General: He is not in acute distress.    Appearance: He is ill-appearing.  HENT:     Head: Normocephalic.   Eyes:     Conjunctiva/sclera: Conjunctivae normal.  Cardiovascular:     Rate and Rhythm: Regular rhythm. Tachycardia present.  Pulmonary:     Effort: Pulmonary effort is normal. Tachypnea present. No respiratory distress.     Breath sounds: No stridor. Decreased breath sounds present. No wheezing.  Abdominal:     General: There is no distension.  Musculoskeletal:     Right lower leg: Edema present.     Left lower leg: Edema present.  Skin:    General: Skin is warm and dry.  Neurological:     Mental Status: He is alert and oriented to person, place, and time.     ED Results / Procedures / Treatments   Labs (all labs ordered are listed, but only abnormal results are  displayed) Labs Reviewed  CBC WITH DIFFERENTIAL/PLATELET - Abnormal; Notable for the following components:      Result Value   WBC 12.5 (*)    Neutro Abs 9.8 (*)    Monocytes Absolute 1.4 (*)    All other components within normal limits  BRAIN NATRIURETIC PEPTIDE - Abnormal; Notable for the following components:   B Natriuretic Peptide >4,500.0 (*)    All other components within normal limits  COMPREHENSIVE METABOLIC PANEL - Abnormal; Notable for the following components:   CO2 21 (*)    Glucose, Bld 148 (*)    BUN 44 (*)    Creatinine, Ser 2.33 (*)    Calcium 8.2 (*)    Total Protein 6.3 (*)    Albumin 3.0 (*)    AST 56 (*)    ALT 216 (*)    Total Bilirubin 2.8 (*)    GFR, Estimated 30 (*)    All other components within normal  limits  RESP PANEL BY RT-PCR (RSV, FLU A&B, COVID)  [id number]    EKG EKG Interpretation  Date/Time:  Sunday July 24 2022 11:40:55 EDT Ventricular Rate:  85 PR Interval:  228 QRS Duration: 123 QT Interval:  419 QTC Calculation: 499 R Axis:   178 Text Interpretation: Sinus or ectopic atrial rhythm T wave abnormality Prolonged PR interval Abnormal ECG Confirmed by Gerhard Munch [phone]) on 07/24/2022 12:10:57 PM  Radiology DG Chest Port 1 View  Result Date: 07/24/2022 CLINICAL DATA:  Shortness of breath. EXAM: PORTABLE CHEST 1 VIEW COMPARISON:  07/19/2022 FINDINGS: The cardio pericardial silhouette is enlarged. Interstitial markings are diffusely coarsened with chronic features. No overt airspace pulmonary edema or focal consolidation. Hazy opacity at the left base may be atelectasis and/or layering pleural fluid. Telemetry leads overlie the chest. IMPRESSION: 1. Chronic interstitial coarsening without overt airspace pulmonary edema. 2. Possible layering left pleural effusion. Electronically Signed   By: Kennith Center M.D.   On: 07/24/2022 11:59    Procedures Procedures    Medications Ordered in ED Medications  furosemide (LASIX) injection 80 mg (80 mg Intravenous Given 07/24/22 1325)    ED Course/ Medical Decision Making/ A&P                             Medical Decision Making Old male with multiple medical issues including COPD, CHF, CAD presents with chest pain shortness of breath.  With recent pneumonia suspicion for progression of disease versus decompensated state possibly multifactorial with the above phenomena. Patient placed on continuous monitoring, pulse oximetry. Cardiac 95 sinus borderline Pulse ox 99% room air normal   Amount and/or Complexity of Data Reviewed External Data Reviewed: notes.    Details: 11 ED visits in the past 6 months. Labs: ordered. Decision-making details documented in ED Course. Radiology: ordered and independent interpretation performed.  Decision-making details documented in ED Course. ECG/medicine tests: ordered and independent interpretation performed. Decision-making details documented in ED Course.  Risk Prescription drug management. Decision regarding hospitalization. Diagnosis or treatment significantly limited by social determinants of health.   2:12 PM Patient on 2 L via inhaler as he has facial deformity. Patient labs notable for worsening renal function, and worsening heart failure.  With tachypnea, tachycardia and intermittent new oxygen requirement, though there is no evidence for bacteremia, sepsis, pneumonia, patient will require admission. He has received IV Lasix in the emergency department.         Final Clinical Impression(s) / ED Diagnoses  Final diagnoses:  Acute on chronic systolic congestive heart failure (HCC)     Gerhard Munch, MD 07/24/22 1413

## 2022-07-24 NOTE — ED Triage Notes (Signed)
Pt complains of having difficulty breathing and has associated chest soreness for about one week.

## 2022-07-24 NOTE — ED Notes (Signed)
ED TO INPATIENT HANDOFF REPORT  ED Nurse Name and Phone #:  Haze Justin [phone] S Name/Age/Gender Michael [name] 67 y.o. male Room/Bed: APA19/APA19  Code Status   Code Status: Full Code  Home/SNF/Other Home Patient oriented to: self, place, time, and situation Is this baseline? Yes   Triage Complete: Triage complete  Chief Complaint Acute on chronic combined systolic and diastolic CHF (congestive heart failure) (HCC) [I50.43]  Triage Note Pt complains of having difficulty breathing and has associated chest soreness for about one week.    Allergies No Known Allergies  Level of Care/Admitting Diagnosis ED Disposition     ED Disposition  Admit   Condition  --   Comment  Hospital Area: [facility] [[id number]]  Level of Care: Stepdown [14]  Covid Evaluation: Asymptomatic - no recent exposure (last 10 days) testing not required  Diagnosis: Acute on chronic combined systolic and diastolic CHF (congestive heart failure) [facility]) [[id number]]  Admitting Physician: Felipa Furnace  Attending Physician: Kendell Bane [phone]  Certification:: I certify this patient will need inpatient services for at least 2 midnights  Estimated Length of Stay: 3          B Medical/Surgery History Past Medical History:  Diagnosis Date   Aortic atherosclerosis (HCC) 08/03/2021   CAD (coronary artery disease) 08/03/2021   Cardiac catheterization September 2023 with RCA/RV marginal stenosis managed medically   Cardiomyopathy (HCC)    Carotid artery disease (HCC)    CKD (chronic kidney disease) stage 3, GFR 30-59 ml/min (HCC)    COPD (chronic obstructive pulmonary disease) (HCC)    Essential hypertension    Head and neck cancer (HCC) 2019   Right facial basal cell carcinoma s/p resection with right partial mastectomy and partal rhinectomy with skin graft (06/13/17)   Iron deficiency anemia    Urinary retention    Past Surgical History:  Procedure Laterality Date    ANKLE CLOSED REDUCTION Right    open reduction   BASAL CELL CARCINOMA EXCISION  2019   at [facility]   BIOPSY  07/19/2021   Procedure: BIOPSY;  Surgeon: Lanelle Bal, DO;  [facility];  Service: Endoscopy;;   COLONOSCOPY N/A 05/31/2017   Procedure: COLONOSCOPY;  Surgeon: Corbin Ade, MD;  Location: [facility];  Service: Endoscopy;  Laterality: N/A;  2:45pm   COLONOSCOPY WITH PROPOFOL N/A 07/19/2021   Procedure: COLONOSCOPY WITH PROPOFOL;  Surgeon: Lanelle Bal, DO;  Location: [facility];  Service: Endoscopy;  Laterality: N/A;  3:00pm, moved up to 9:00   CYSTOSCOPY N/A 10/29/2018   Procedure: CYSTOSCOPY, CLOT EVACUATION;  Surgeon: Rene Paci, MD;  Location: [facility];  Service: Urology;  Laterality: N/A;   PARTIAL COLECTOMY Right 08/11/2021   Procedure: PARTIAL COLECTOMY, OPEN RIGHT HEMICOLECTOMY;  Surgeon: Lucretia Roers, MD;  Location: [facility];  Service: General;  Laterality: Right;   POLYPECTOMY  05/31/2017   Procedure: POLYPECTOMY;  Surgeon: Corbin Ade, MD;  [facility];  Service: Endoscopy;;   RIGHT/LEFT HEART CATH AND CORONARY ANGIOGRAPHY N/A 10/22/2021   Procedure: RIGHT/LEFT HEART CATH AND CORONARY ANGIOGRAPHY;  Surgeon: Orbie Pyo, MD;  Location: [facility];  Service: Cardiovascular;  Laterality: N/A;   TONSILLECTOMY     TRANSCAROTID ARTERY REVASCULARIZATION  Right 02/15/2021   Procedure: RIGHT TRANSCAROTID ARTERY REVASCULARIZATION;  Surgeon: Cephus Shelling, MD;  Location: [facility] OR;  Service: Vascular;  Laterality: Right;   ULTRASOUND GUIDANCE FOR VASCULAR ACCESS Left 02/15/2021  Procedure: ULTRASOUND GUIDANCE FOR VASCULAR ACCESS;  Surgeon: Cephus Shelling, MD;  Location: [facility] OR;  Service: Vascular;  Laterality: Left;   XI ROBOTIC ASSISTED SIMPLE PROSTATECTOMY N/A 10/29/2018   Procedure: XI ROBOTIC ASSISTED SIMPLE PROSTATECTOMY;  Surgeon: Malen Gauze, MD;  Location: [facility];  Service: Urology;  Laterality:  N/A;     A IV Location/Drains/Wounds Patient Lines/Drains/Airways Status     Active Line/Drains/Airways     Name Placement date Placement time Site Days   Peripheral IV 07/24/22 18 G 1" Posterior;Proximal;Right Forearm 07/24/22  1155  Forearm  less than 1            Intake/Output Last 24 hours No intake or output data in the 24 hours ending 07/24/22 1442  Labs/Imaging Results for orders placed or performed during the hospital encounter of 07/24/22 (from the past 48 hour(s))  CBC with Differential/Platelet     Status: Abnormal   Collection Time: 07/24/22 11:30 AM  Result Value Ref Range   WBC 12.5 (H) 4.0 - 10.5 K/uL   RBC 4.55 4.22 - 5.81 MIL/uL   Hemoglobin 13.1 13.0 - 17.0 g/dL   HCT 16.1 09.6 - 04.5 %   MCV 91.6 80.0 - 100.0 fL   MCH 28.8 26.0 - 34.0 pg   MCHC 31.4 30.0 - 36.0 g/dL   RDW 40.9 81.1 - 91.4 %   Platelets 270 150 - 400 K/uL   nRBC 0.0 0.0 - 0.2 %   Neutrophils Relative % 78 %   Neutro Abs 9.8 (H) 1.7 - 7.7 K/uL   Lymphocytes Relative 8 %   Lymphs Abs 1.1 0.7 - 4.0 K/uL   Monocytes Relative 12 %   Monocytes Absolute 1.4 (H) 0.1 - 1.0 K/uL   Eosinophils Relative 0 %   Eosinophils Absolute 0.0 0.0 - 0.5 K/uL   Basophils Relative 1 %   Basophils Absolute 0.1 0.0 - 0.1 K/uL   Immature Granulocytes 1 %   Abs Immature Granulocytes 0.06 0.00 - 0.07 K/uL    Comment: Performed at [facility], [address] [place], [place] [address]  Brain natriuretic peptide     Status: Abnormal   Collection Time: 07/24/22 11:30 AM  Result Value Ref Range   B Natriuretic Peptide >4,500.0 (H) 0.0 - 100.0 pg/mL    Comment: Performed at [facility], [address] [place], [place] [address]  Resp panel by RT-PCR (RSV, Flu A&B, Covid) Anterior Nasal Swab     Status: None   Collection Time: 07/24/22 11:47 AM   Specimen: Anterior Nasal Swab  Result Value Ref Range   SARS Coronavirus 2 by RT PCR NEGATIVE NEGATIVE    Comment: (NOTE) SARS-CoV-2 target nucleic acids are NOT  DETECTED.  The SARS-CoV-2 RNA is generally detectable in upper respiratory specimens during the acute phase of infection. The lowest concentration of SARS-CoV-2 viral copies this assay can detect is 138 copies/mL. A negative result does not preclude SARS-Cov-2 infection and should not be used as the sole basis for treatment or other patient management decisions. A negative result may occur with  improper specimen collection/handling, submission of specimen other than nasopharyngeal swab, presence of viral mutation(s) within the areas targeted by this assay, and inadequate number of viral copies(<138 copies/mL). A negative result must be combined with clinical observations, patient history, and epidemiological information. The expected result is Negative.  Fact Sheet for Patients:  BloggerCourse.com  Fact Sheet for Healthcare Providers:  SeriousBroker.it  This test is no t yet approved or  cleared by the [place] and  has been authorized for detection and/or diagnosis of SARS-CoV-2 by FDA under an Emergency Use Authorization (EUA). This EUA will remain  in effect (meaning this test can be used) for the duration of the COVID-19 declaration under Section 564(b)(1) of the Act, 21 U.S.C.section 360bbb-3(b)(1), unless the authorization is terminated  or revoked sooner.       Influenza A by PCR NEGATIVE NEGATIVE   Influenza B by PCR NEGATIVE NEGATIVE    Comment: (NOTE) The Xpert Xpress SARS-CoV-2/FLU/RSV plus assay is intended as an aid in the diagnosis of influenza from Nasopharyngeal swab specimens and should not be used as a sole basis for treatment. Nasal washings and aspirates are unacceptable for Xpert Xpress SARS-CoV-2/FLU/RSV testing.  Fact Sheet for Patients: BloggerCourse.com  Fact Sheet for Healthcare Providers: SeriousBroker.it  This test is not yet approved or  cleared by the [place] FDA and has been authorized for detection and/or diagnosis of SARS-CoV-2 by FDA under an Emergency Use Authorization (EUA). This EUA will remain in effect (meaning this test can be used) for the duration of the COVID-19 declaration under Section 564(b)(1) of the Act, 21 U.S.C. section 360bbb-3(b)(1), unless the authorization is terminated or revoked.     Resp Syncytial Virus by PCR NEGATIVE NEGATIVE    Comment: (NOTE) Fact Sheet for Patients: BloggerCourse.com  Fact Sheet for Healthcare Providers: SeriousBroker.it  This test is not yet approved or cleared by the [place] FDA and has been authorized for detection and/or diagnosis of SARS-CoV-2 by FDA under an Emergency Use Authorization (EUA). This EUA will remain in effect (meaning this test can be used) for the duration of the COVID-19 declaration under Section 564(b)(1) of the Act, 21 U.S.C. section 360bbb-3(b)(1), unless the authorization is terminated or revoked.  Performed at [facility], [address] [place], [place] [address]   Comprehensive metabolic panel     Status: Abnormal   Collection Time: 07/24/22 12:10 PM  Result Value Ref Range   Sodium 138 135 - 145 mmol/L   Potassium 4.2 3.5 - 5.1 mmol/L   Chloride 106 98 - 111 mmol/L   CO2 21 (L) 22 - 32 mmol/L   Glucose, Bld 148 (H) 70 - 99 mg/dL    Comment: Glucose reference range applies only to samples taken after fasting for at least 8 hours.   BUN 44 (H) 8 - 23 mg/dL   Creatinine, Ser 5.28 (H) 0.61 - 1.24 mg/dL   Calcium 8.2 (L) 8.9 - 10.3 mg/dL   Total Protein 6.3 (L) 6.5 - 8.1 g/dL   Albumin 3.0 (L) 3.5 - 5.0 g/dL   AST 56 (H) 15 - 41 U/L   ALT 216 (H) 0 - 44 U/L   Alkaline Phosphatase 105 38 - 126 U/L   Total Bilirubin 2.8 (H) 0.3 - 1.2 mg/dL   GFR, Estimated 30 (L) >60 mL/min    Comment: (NOTE) Calculated using the CKD-EPI Creatinine Equation (2021)    Anion gap 11 5 -  15    Comment: Performed at [facility], [address], [place], [place] [address]   DG Chest Port 1 View  Result Date: 07/24/2022 CLINICAL DATA:  Shortness of breath. EXAM: PORTABLE CHEST 1 VIEW COMPARISON:  07/19/2022 FINDINGS: The cardio pericardial silhouette is enlarged. Interstitial markings are diffusely coarsened with chronic features. No overt airspace pulmonary edema or focal consolidation. Hazy opacity at the left base may be atelectasis and/or layering pleural fluid. Telemetry leads overlie the chest.  IMPRESSION: 1. Chronic interstitial coarsening without overt airspace pulmonary edema. 2. Possible layering left pleural effusion. Electronically Signed   By: Kennith Center M.D.   On: 07/24/2022 11:59    Pending Labs Unresulted Labs (From admission, onward)     Start     Ordered   07/25/22 0500  Basic metabolic panel  Daily,   R      07/24/22 1426   07/25/22 0500  CBC  Daily,   R      07/24/22 1426   07/25/22 0500  Protime-INR  Tomorrow morning,   R        07/24/22 1426   07/25/22 0500  APTT  Tomorrow morning,   R        07/24/22 1426   07/25/22 0500  Brain natriuretic peptide  Daily,   R      07/24/22 1426   07/24/22 1422  Magnesium  Add-on,   AD        07/24/22 1426   07/24/22 1422  Phosphorus  Add-on,   AD        07/24/22 1426   07/24/22 1422  Protime-INR  Add-on,   AD        07/24/22 1426            Vitals/Pain Today's Vitals   07/24/22 1200 07/24/22 1230 07/24/22 1300 07/24/22 1330  BP: (!) 149/107 (!) 164/104 (!) 150/105 (!) 148/134  Pulse: 91 97 96 (!) 104  Resp: (!) 30 (!) 23 (!) 31 17  Temp:      SpO2: 93% 95% 91% 98%  Weight:      Height:      PainSc:        Isolation Precautions No active isolations  Medications Medications  heparin injection 5,000 Units (has no administration in time range)  sodium chloride flush (NS) 0.9 % injection 3 mL (has no administration in time range)  sodium chloride flush (NS) 0.9 % injection 3 mL (has no  administration in time range)  sodium chloride flush (NS) 0.9 % injection 3 mL (has no administration in time range)  0.9 %  sodium chloride infusion (has no administration in time range)  acetaminophen (TYLENOL) tablet 650 mg (has no administration in time range)    Or  acetaminophen (TYLENOL) suppository 650 mg (has no administration in time range)  oxyCODONE (Oxy IR/ROXICODONE) immediate release tablet 5 mg (has no administration in time range)  HYDROmorphone (DILAUDID) injection 0.5-1 mg (has no administration in time range)  traZODone (DESYREL) tablet 25 mg (has no administration in time range)  senna-docusate (Senokot-S) tablet 1 tablet (has no administration in time range)  bisacodyl (DULCOLAX) EC tablet 5 mg (has no administration in time range)  sodium phosphate (FLEET) 7-19 GM/118ML enema 1 enema (has no administration in time range)  ondansetron (ZOFRAN) tablet 4 mg (has no administration in time range)    Or  ondansetron (ZOFRAN) injection 4 mg (has no administration in time range)  ipratropium (ATROVENT) nebulizer solution 0.5 mg (has no administration in time range)  levalbuterol (XOPENEX) nebulizer solution 0.63 mg (has no administration in time range)  hydrALAZINE (APRESOLINE) injection 10 mg (has no administration in time range)  furosemide (LASIX) injection 80 mg (80 mg Intravenous Given 07/24/22 1325)    Mobility Walks with assistance     Focused Assessments    R Recommendations: See Admitting Provider Note  Report given to:   Additional Notes:

## 2022-07-24 NOTE — H&P (Signed)
History and Physical   Patient: Michael [name]                            PCP: Benita Stabile, MD                    DOB: 1956-01-08            DOA: 07/24/2022 ZOX:[id number]             DOS: 07/24/2022, 2:56 PM  Benita Stabile, MD  Patient coming from:   HOME  I have personally reviewed patient's medical records, in electronic medical records, including:  [place] link, and care everywhere.    Chief Complaint:   Chief Complaint  Patient presents with   Shortness of Breath    History of present illness:    Michael [name] Is a 67 year old male with a history of HFrEF, (EJF35-40% ): CKD stage III, coronary disease, COPD, tobacco abuse, basal cell carcinoma status post partial rhinectomy and maxillectomy 06/06/2017 at [facility], advanced colonic tubular adenoma, right carotid stenosis, hypertension, presenting with shortness of breath. Apparently patient was diagnosed with pneumonia last week, has been taking his antibiotics, but he is progressively getting more shortness of breath past 3-4 days.  Reporting shortness of breath at rest, and with minimal exertion gets worse.  Multiple hospitalization in past 2 months for shortness of breath, COPD exacerbation Hospitalization, recent discharge 05/06/2022, also admission and discharged 04/29/2018 for COPD exacerbation, shortness of breath, and syncope.  ED evaluation/course Blood pressure (!) 148/134, pulse (!) 104, temperature 97.8 F (36.6 C), RR  17, weight 66.5 kg, SpO2 98 % on 4 L  CBC: WBC 12.5, CMP BUN 44 creatinine 2.33, calcium 8.2, glucose 148,  Chronically elevated BNP.Marland Kitchen. now > 40000,   Patient Denies having: Fever, Chills,  Abd pain, N/V/D, headache, dizziness, lightheadedness,  Dysuria, Joint pain, rash, open wounds    Assessment / Plan:   Principal Problem:   Acute on chronic systolic CHF (congestive heart failure) (HCC) Active Problems:   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   Essential hypertension   Anemia    Acute hypoxemic respiratory failure (HCC)   COPD with acute exacerbation (HCC)   Acute renal failure superimposed on chronic kidney disease (HCC)   Chronic HFrEF (heart failure with reduced ejection fraction) (HCC)   Carotid stenosis, asymptomatic, right   CAD (coronary artery disease)   Chronic kidney disease, stage 3b (HCC) Acute on chronic respiratory failure -Multifactorial including mild COPD exacerbation, HFrEF, and deconditioning -This presentation more consistent with CHF exacerbation -BNP>4500 Filed Weights   07/24/22 1103  Weight: 66.5 kg   -Chest x-ray:  Acute on chronic interstitial finding layering left pleural effusionwithout any acute infiltrates -Patient has received 80 mg of IV Lasix  -Continue as needed DuoNeb bronchodilators, Pulmicort, -Patient not wheezing, but holding IV steroids -Currently requiring 4 L of oxygen 98%   Chronic HFrEF -acute on chronic systolic congestive heart failure -Entresto and carvedilol was discontinued during his last hospitalization -04/30/2022 echo EF 35-40%, global HK, normal PASP, mild TR  -on the last admission carvedilol was discontinued due to persistent orthostasis -Patient was received IV Lasix 80 mg in ED -Will continue with IV Lasix 40 mg IV twice daily -Resuming Imdur, Coreg  -Will monitor daily weight, I's and O's -BNP >4500 >>> - ReDs Clip >>    Acute on chronic CKD stage IIIb -Baseline creatinine 1.7-2.0 -serum creatinine was  1.97 on last discharge Lab Results  Component Value Date   CREATININE 2.33 (H) 07/24/2022   CREATININE 1.84 (H) 05/07/2022   CREATININE 1.97 (H) 05/06/2022   Will monitor closely   Essential hypertension -holding coreg as discussed above As needed hydralazine   Tobacco abuse -Tobacco cessation discussed   Hyperlipidemia  -Continue high-dose statins   Right Carotid stenosis -Will continue high-dose statins, Plavix -The patient has follow-up with Dr. Leonette Most fields -05/12/2017  carotid ultrasound shows 80-99% right carotid stenosis -70% heavily calcified RICA stenosis and no significant left-sided disease by CT angiography.   - He is now following with Dr. Olga Coaster was not felt to be a candidate for carotid stenting and suboptimal candidate for endarterectomy.     Basal cell carcinoma of nasolabial fold -06/06/2017--right partial rhinectomy, resection of left intranasal lesion, skin graft @[facility] -Margins were clear on pathology -Most recent follow-up was 06/23/2017   Iron deficiency anemia/Advanced colonic adenoma -The patient had colonoscopy 05/31/2017--Dr. Rourk -He was noted to have numerous polyps as well as a polypoid mass in the hepatic flexure to the cecum area -pathology of mass--tubular adenoma -He was referred to general surgery, Dr. Larae Grooms for hemicolectomy -He has been lost to follow-up -noncompliant with po iron     Latest Ref Rng & Units 07/24/2022   11:30 AM 05/07/2022    3:38 AM 05/05/2022    2:41 AM  CBC  WBC 4.0 - 10.5 K/uL 12.5  12.8  9.9   Hemoglobin 13.0 - 17.0 g/dL 81.1  91.4  78.2   Hematocrit 39.0 - 52.0 % 41.7  41.3  46.4   Platelets 150 - 400 K/uL 270  PLATELETS APPEAR ADEQUATE  273       Consults called: Cardiology -------------------------------------------------------------------------------------------------------------------------------------------- DVT prophylaxis:  heparin injection 5,000 Units Start: 07/24/22 2200 TED hose Start: 07/24/22 1422 SCDs Start: 07/24/22 1422   Code Status:   Code Status: Full Code   Admission status: Patient will be admitted as Inpatient, with a greater than 2 midnight length of stay. Level of care: Stepdown   Family Communication:  none at bedside  (The above findings and plan of care has been discussed with patient in detail, the patient expressed understanding and agreement of above plan)   --------------------------------------------------------------------------------------------------------------------------------------------------  Disposition Plan: >3 days Status is: Inpatient Remains inpatient appropriate because: Needing aggressive IV diuretics, breathing treatment consultation from cardiology   Review of Systems: As per HPI, otherwise 10 point review of systems were negative.   ----------------------------------------------------------------------------------------------------------------------  No Known Allergies  Home MEDs:  Prior to Admission medications   Medication Sig Start Date End Date Taking? Authorizing Provider  acetaminophen (TYLENOL) 650 MG CR tablet Take 1,300 mg by mouth as needed for pain.    [provider]  albuterol (VENTOLIN HFA) 108 (90 Base) MCG/ACT inhaler Inhale 2 puffs into the lungs every 6 (six) hours as needed for wheezing or shortness of breath. 04/27/22   Darreld Hoffer, Gemma Payor, MD  atorvastatin (LIPITOR) 80 MG tablet Take 1 tablet (80 mg total) by mouth every evening. 05/13/22   Strader, Lennart Pall, PA-C  Budeson-Glycopyrrol-Formoterol (BREZTRI AEROSPHERE) 160-9-4.8 MCG/ACT AERO Inhale 2 puffs into the lungs 2 (two) times daily.    [provider]  carvedilol (COREG) 3.125 MG tablet Take 1 tablet (3.125 mg total) by mouth 2 (two) times daily with a meal. 05/13/22   Strader, [place] M, PA-C  clopidogrel (PLAVIX) 75 MG tablet Take 1 tablet (75 mg total) by mouth daily. 05/13/22   Strader,  [place] M, PA-C  furosemide (LASIX) 20 MG tablet Take 1 tablet (20 mg total) by mouth daily. For 3 days then take as needed for dyspnea, weight gain or lower extremity swelling. 05/13/22   Strader, Lennart Pall, PA-C  isosorbide mononitrate (IMDUR) 60 MG 24 hr tablet Take 1 tablet (60 mg total) by mouth every evening. 05/13/22   Strader, Lennart Pall, PA-C    PRN MEDs: sodium chloride, acetaminophen **OR** acetaminophen, bisacodyl, hydrALAZINE,  HYDROmorphone (DILAUDID) injection, ipratropium, levalbuterol, ondansetron **OR** ondansetron (ZOFRAN) IV, oxyCODONE, senna-docusate, sodium chloride flush, sodium phosphate, traZODone  Past Medical History:  Diagnosis Date   Aortic atherosclerosis (HCC) 08/03/2021   CAD (coronary artery disease) 08/03/2021   Cardiac catheterization September 2023 with RCA/RV marginal stenosis managed medically   Cardiomyopathy (HCC)    Carotid artery disease (HCC)    CKD (chronic kidney disease) stage 3, GFR 30-59 ml/min (HCC)    COPD (chronic obstructive pulmonary disease) (HCC)    Essential hypertension    Head and neck cancer (HCC) 2019   Right facial basal cell carcinoma s/p resection with right partial mastectomy and partal rhinectomy with skin graft (06/13/17)   Iron deficiency anemia    Urinary retention     Past Surgical History:  Procedure Laterality Date   ANKLE CLOSED REDUCTION Right    open reduction   BASAL CELL CARCINOMA EXCISION  2019   at [facility]   BIOPSY  07/19/2021   Procedure: BIOPSY;  Surgeon: Lanelle Bal, DO;  Location: [facility];  Service: Endoscopy;;   COLONOSCOPY N/A 05/31/2017   Procedure: COLONOSCOPY;  Surgeon: Corbin Ade, MD;  Location: [facility];  Service: Endoscopy;  Laterality: N/A;  2:45pm   COLONOSCOPY WITH PROPOFOL N/A 07/19/2021   Procedure: COLONOSCOPY WITH PROPOFOL;  Surgeon: Lanelle Bal, DO;  Location: [facility];  Service: Endoscopy;  Laterality: N/A;  3:00pm, moved up to 9:00   CYSTOSCOPY N/A 10/29/2018   Procedure: CYSTOSCOPY, CLOT EVACUATION;  Surgeon: Rene Paci, MD;  Location: [facility];  Service: Urology;  Laterality: N/A;   PARTIAL COLECTOMY Right 08/11/2021   Procedure: PARTIAL COLECTOMY, OPEN RIGHT HEMICOLECTOMY;  Surgeon: Lucretia Roers, MD;  Location: [facility];  Service: General;  Laterality: Right;   POLYPECTOMY  05/31/2017   Procedure: POLYPECTOMY;  Surgeon: Corbin Ade, MD;  [facility];  Service:  Endoscopy;;   RIGHT/LEFT HEART CATH AND CORONARY ANGIOGRAPHY N/A 10/22/2021   Procedure: RIGHT/LEFT HEART CATH AND CORONARY ANGIOGRAPHY;  Surgeon: Orbie Pyo, MD;  Location: [facility];  Service: Cardiovascular;  Laterality: N/A;   TONSILLECTOMY     TRANSCAROTID ARTERY REVASCULARIZATION  Right 02/15/2021   Procedure: RIGHT TRANSCAROTID ARTERY REVASCULARIZATION;  Surgeon: Cephus Shelling, MD;  Location: [facility] OR;  Service: Vascular;  Laterality: Right;   ULTRASOUND GUIDANCE FOR VASCULAR ACCESS Left 02/15/2021   Procedure: ULTRASOUND GUIDANCE FOR VASCULAR ACCESS;  Surgeon: Cephus Shelling, MD;  Location: [facility] OR;  Service: Vascular;  Laterality: Left;   XI ROBOTIC ASSISTED SIMPLE PROSTATECTOMY N/A 10/29/2018   Procedure: XI ROBOTIC ASSISTED SIMPLE PROSTATECTOMY;  Surgeon: Malen Gauze, MD;  Location: [facility];  Service: Urology;  Laterality: N/A;     reports that he quit smoking about 12 months ago. His smoking use included cigarettes. He has a 20.00 pack-year smoking history. He has never been exposed to tobacco smoke. He has never used smokeless tobacco. He reports that he does not currently use alcohol. He reports that he does  not use drugs.   Family History  Problem Relation Age of Onset   Stroke Father    Cirrhosis Mother    Colon cancer Neg Hx     Physical Exam:   Vitals:   07/24/22 1200 07/24/22 1230 07/24/22 1300 07/24/22 1330  BP: (!) 149/107 (!) 164/104 (!) 150/105 (!) 148/134  Pulse: 91 97 96 (!) 104  Resp: (!) 30 (!) 23 (!) 31 17  Temp:      SpO2: 93% 95% 91% 98%  Weight:      Height:       Constitutional: NAD, calm, comfortable Eyes: PERRL, lids and conjunctivae normal ENMT:no nasal labia ... Reconstucted nasal/ facial folds  Mucous membranes are moist. Posterior pharynx clear of any exudate or lesions.Normal dentition.  Neck: normal, supple, no masses, no thyromegaly Respiratory: clear to auscultation bilaterally, no wheezing, no crackles. Normal  respiratory effort. No accessory muscle use.  Cardiovascular: Regular rate and rhythm, no murmurs / rubs / gallops. +++3  extremity edema. 2+ pedal pulses. No carotid bruits.  Abdomen: no tenderness, no masses palpated. No hepatosplenomegaly. Bowel sounds positive.  Musculoskeletal: no clubbing / cyanosis. No joint deformity upper and lower extremities. Good ROM, no contractures. Normal muscle tone.  Neurologic: CN II-XII grossly intact. Sensation intact, DTR normal. Strength 5/5 in all 4.  Psychiatric: Normal judgment and insight. Alert and oriented x 3. Normal mood.  Skin: no rashes, lesions, ulcers. No induration  Wounds: small -open nasal wound,          Labs on admission:    I have personally reviewed following labs and imaging studies  CBC: Recent Labs  Lab 07/24/22 1130  WBC 12.5*  NEUTROABS 9.8*  HGB 13.1  HCT 41.7  MCV 91.6  PLT 270   Basic Metabolic Panel: Recent Labs  Lab 07/24/22 1130 07/24/22 1210  NA  --  138  K  --  4.2  CL  --  106  CO2  --  21*  GLUCOSE  --  148*  BUN  --  44*  CREATININE  --  2.33*  CALCIUM  --  8.2*  MG 2.0  --   PHOS 3.5  --    GFR: Estimated Creatinine Clearance: 28.5 mL/min (A) (by C-G formula based on SCr of 2.33 mg/dL (H)). Liver Function Tests: Recent Labs  Lab 07/24/22 1210  AST 56*  ALT 216*  ALKPHOS 105  BILITOT 2.8*  PROT 6.3*  ALBUMIN 3.0*   No results for input(s): "LIPASE", "AMYLASE" in the last 168 hours. No results for input(s): "AMMONIA" in the last 168 hours. Coagulation Profile: Recent Labs  Lab 07/24/22 1130  INR 1.5*   Cardiac Enzymes: No results for input(s): "CKTOTAL", "CKMB", "CKMBINDEX", "TROPONINI" in the last 168 hours. BNP (last 3 results) No results for input(s): "PROBNP" in the last 8760 hours. HbA1C: No results for input(s): "HGBA1C" in the last 72 hours. CBG: No results for input(s): "GLUCAP" in the last 168 hours. Lipid Profile: No results for input(s): "CHOL", "HDL",  "LDLCALC", "TRIG", "CHOLHDL", "LDLDIRECT" in the last 72 hours. Thyroid Function Tests: No results for input(s): "TSH", "T4TOTAL", "FREET4", "T3FREE", "THYROIDAB" in the last 72 hours. Anemia Panel: No results for input(s): "VITAMINB12", "FOLATE", "FERRITIN", "TIBC", "IRON", "RETICCTPCT" in the last 72 hours. Urine analysis:    Component Value Date/Time   COLORURINE COLORLESS (A) 05/05/2022 0830   APPEARANCEUR CLEAR 05/05/2022 0830   APPEARANCEUR Cloudy (A) 06/16/2021 0936   LABSPEC 1.005 05/05/2022 0830   LABSPEC 1.018 04/23/2011  1044   PHURINE 5.0 05/05/2022 0830   GLUCOSEU NEGATIVE 05/05/2022 0830   GLUCOSEU Negative 04/23/2011 1044   HGBUR NEGATIVE 05/05/2022 0830   BILIRUBINUR NEGATIVE 05/05/2022 0830   BILIRUBINUR Negative 06/16/2021 0936   BILIRUBINUR Negative 04/23/2011 1044   KETONESUR NEGATIVE 05/05/2022 0830   PROTEINUR NEGATIVE 05/05/2022 0830   NITRITE NEGATIVE 05/05/2022 0830   LEUKOCYTESUR SMALL (A) 05/05/2022 0830   LEUKOCYTESUR Trace 04/23/2011 1044    Last A1C:  Lab Results  Component Value Date   HGBA1C 5.2 08/09/2021     Radiologic Exams on Admission:   DG Chest Port 1 View  Result Date: 07/24/2022 CLINICAL DATA:  Shortness of breath. EXAM: PORTABLE CHEST 1 VIEW COMPARISON:  07/19/2022 FINDINGS: The cardio pericardial silhouette is enlarged. Interstitial markings are diffusely coarsened with chronic features. No overt airspace pulmonary edema or focal consolidation. Hazy opacity at the left base may be atelectasis and/or layering pleural fluid. Telemetry leads overlie the chest. IMPRESSION: 1. Chronic interstitial coarsening without overt airspace pulmonary edema. 2. Possible layering left pleural effusion. Electronically Signed   By: Kennith Center M.D.   On: 07/24/2022 11:59    EKG:   Independently reviewed.  Orders placed or performed during the hospital encounter of 07/24/22   EKG 12-Lead   EKG 12-Lead   ED EKG   ED EKG   EKG 12-Lead   EKG  12-Lead   EKG 12-Lead   ---------------------------------------------------------------------------------------------------------------------------------------     ----------------------------------------------------------------------------------------------------------------------------------------------------  Time spent:  53  Min.  Was spent seeing and evaluating the patient, reviewing all medical records, drawn plan of care.  SIGNED: Kendell Bane, MD, FHM. FAAFP. [place] [facility], Pager  (Please use [email or web address] to page/ or secure chat through epic) If 7PM-7AM, please contact night-coverage [email or web address],  07/24/2022, 2:56 PM

## 2022-07-25 ENCOUNTER — Encounter (HOSPITAL_COMMUNITY): Payer: Self-pay | Admitting: Family Medicine

## 2022-07-25 DIAGNOSIS — E876 Hypokalemia: Secondary | ICD-10-CM

## 2022-07-25 DIAGNOSIS — I5023 Acute on chronic systolic (congestive) heart failure: Secondary | ICD-10-CM | POA: Diagnosis not present

## 2022-07-25 DIAGNOSIS — Z7189 Other specified counseling: Secondary | ICD-10-CM

## 2022-07-25 DIAGNOSIS — I6529 Occlusion and stenosis of unspecified carotid artery: Secondary | ICD-10-CM

## 2022-07-25 DIAGNOSIS — Z515 Encounter for palliative care: Secondary | ICD-10-CM

## 2022-07-25 DIAGNOSIS — C44311 Basal cell carcinoma of skin of nose: Secondary | ICD-10-CM

## 2022-07-25 DIAGNOSIS — I2510 Atherosclerotic heart disease of native coronary artery without angina pectoris: Secondary | ICD-10-CM

## 2022-07-25 LAB — PROTIME-INR
INR: 1.6 — ABNORMAL HIGH (ref 0.8–1.2)
Prothrombin Time: 19 seconds — ABNORMAL HIGH (ref 11.4–15.2)

## 2022-07-25 LAB — APTT: aPTT: 32 seconds (ref 24–36)

## 2022-07-25 LAB — CBC
HCT: 34.7 % — ABNORMAL LOW (ref 39.0–52.0)
Hemoglobin: 10.9 g/dL — ABNORMAL LOW (ref 13.0–17.0)
MCH: 28.7 pg (ref 26.0–34.0)
MCHC: 31.4 g/dL (ref 30.0–36.0)
MCV: 91.3 fL (ref 80.0–100.0)
Platelets: 257 10*3/uL (ref 150–400)
RBC: 3.8 MIL/uL — ABNORMAL LOW (ref 4.22–5.81)
RDW: 14.9 % (ref 11.5–15.5)
WBC: 13.6 10*3/uL — ABNORMAL HIGH (ref 4.0–10.5)
nRBC: 0 % (ref 0.0–0.2)

## 2022-07-25 LAB — COMPREHENSIVE METABOLIC PANEL
ALT: 136 U/L — ABNORMAL HIGH (ref 0–44)
AST: 35 U/L (ref 15–41)
Albumin: 2.6 g/dL — ABNORMAL LOW (ref 3.5–5.0)
Alkaline Phosphatase: 86 U/L (ref 38–126)
Anion gap: 9 (ref 5–15)
BUN: 42 mg/dL — ABNORMAL HIGH (ref 8–23)
CO2: 21 mmol/L — ABNORMAL LOW (ref 22–32)
Calcium: 7.4 mg/dL — ABNORMAL LOW (ref 8.9–10.3)
Chloride: 104 mmol/L (ref 98–111)
Creatinine, Ser: 1.96 mg/dL — ABNORMAL HIGH (ref 0.61–1.24)
GFR, Estimated: 37 mL/min — ABNORMAL LOW (ref >60)
Glucose, Bld: 133 mg/dL — ABNORMAL HIGH (ref 70–99)
Potassium: 2.8 mmol/L — ABNORMAL LOW (ref 3.5–5.1)
Sodium: 134 mmol/L — ABNORMAL LOW (ref 135–145)
Total Bilirubin: 2 mg/dL — ABNORMAL HIGH (ref 0.3–1.2)
Total Protein: 5.5 g/dL — ABNORMAL LOW (ref 6.5–8.1)

## 2022-07-25 LAB — GLUCOSE, CAPILLARY: Glucose-Capillary: 135 mg/dL — ABNORMAL HIGH (ref 70–99)

## 2022-07-25 LAB — BRAIN NATRIURETIC PEPTIDE: B Natriuretic Peptide: >4500 pg/mL — ABNORMAL HIGH (ref 0.0–100.0)

## 2022-07-25 MED ORDER — SODIUM BICARBONATE 650 MG PO TABS
650.0000 mg | ORAL_TABLET | Freq: Two times a day (BID) | ORAL | Status: AC
  Administered 2022-07-25 (×2): 650 mg via ORAL
  Filled 2022-07-25 (×2): qty 1

## 2022-07-25 MED ORDER — POTASSIUM CHLORIDE CRYS ER 20 MEQ PO TBCR
40.0000 meq | EXTENDED_RELEASE_TABLET | Freq: Once | ORAL | Status: AC
  Administered 2022-07-25: 40 meq via ORAL
  Filled 2022-07-25: qty 2

## 2022-07-25 MED ORDER — MAGNESIUM SULFATE 2 GM/50ML IV SOLN
2.0000 g | Freq: Once | INTRAVENOUS | Status: AC
  Filled 2022-07-25: qty 50

## 2022-07-25 MED ORDER — FUROSEMIDE 10 MG/ML IJ SOLN
60.0000 mg | Freq: Two times a day (BID) | INTRAMUSCULAR | Status: DC
  Administered 2022-07-25 – 2022-07-26 (×3): 60 mg via INTRAVENOUS
  Filled 2022-07-25: qty 10
  Filled 2022-07-25: qty 6

## 2022-07-25 MED ORDER — MAGNESIUM SULFATE 2 GM/50ML IV SOLN
INTRAVENOUS | Status: AC
  Administered 2022-07-25: 2 g via INTRAVENOUS
  Filled 2022-07-25: qty 50

## 2022-07-25 MED ORDER — ATORVASTATIN CALCIUM 40 MG PO TABS: 40.0000 mg | ORAL_TABLET | Freq: Every evening | ORAL | Status: DC

## 2022-07-25 NOTE — Consult Note (Signed)
Cardiology Consultation   Patient ID: Michael [name] MRN: [id number]; DOB: [birth year]  Admit date: 07/24/2022 Date of Consult: 07/25/2022  PCP:  Benita Stabile, MD    [facility] Providers Cardiologist:  Nona Dell, MD        Patient Profile:   Michael [name] is a 67 y.o. male with a hx of with past medical history of HFrEF (EF 30% in 10/2021, at 35-40% in 04/2022), CAD (cath in 10/2021 showing mild to moderate obstructive CAD along mid-RCA and acute Mrg with medical management recommended), carotid artery stenosis (s/p right TCAR in 01/2021), iron deficiency anemia, HTN, HLD,  COPD and Stage 3 CKD who is being seen 07/25/2022 for the evaluation of Acute CHF at the request of Dr. Flossie Dibble.  History of Present Illness:   Mr. Mccadden with above PMHx has had multiple admissions for CHF driven by homelessness and inability to take meds regularly. Last OV 05/13/22 he was staying with family and taking his meds. BP was up so coreg started at lower dose since he's had orthostatic hypotension in the past.  In ED BP 148/134 P 104. BNP >4500. Was given lasix 80 mg iv in ED then 40 mg IV BID. Notes say he was diagnosed with pneumonia last week and put on antibiotic but patient can't tell me if he was on antibiotics or not. Poor historian. Says he quit smoking 2 weeks ago. >40 pack years. Gradual worsening of dyspnea, only mild leg edema. Denies excess salt in diet. Still living with family and says he's taking his meds. He's feeling much better after lasix. No longer requiring O2 and up walking with PT.   Past Medical History:  Diagnosis Date   Aortic atherosclerosis (HCC) 08/03/2021   CAD (coronary artery disease) 08/03/2021   Cardiac catheterization September 2023 with RCA/RV marginal stenosis managed medically   Cardiomyopathy (HCC)    Carotid artery disease (HCC)    CKD (chronic kidney disease) stage 3, GFR 30-59 ml/min (HCC)    COPD (chronic obstructive pulmonary disease) (HCC)     Essential hypertension    Head and neck cancer (HCC) 2019   Right facial basal cell carcinoma s/p resection with right partial mastectomy and partal rhinectomy with skin graft (06/13/17)   Iron deficiency anemia    Urinary retention     Past Surgical History:  Procedure Laterality Date   ANKLE CLOSED REDUCTION Right    open reduction   BASAL CELL CARCINOMA EXCISION  2019   at [facility]   BIOPSY  07/19/2021   Procedure: BIOPSY;  Surgeon: Lanelle Bal, DO;  [facility];  Service: Endoscopy;;   COLONOSCOPY N/A 05/31/2017   Procedure: COLONOSCOPY;  Surgeon: Corbin Ade, MD;  [facility]: [facility];  Service: Endoscopy;  Laterality: N/A;  2:45pm   COLONOSCOPY WITH PROPOFOL N/A 07/19/2021   Procedure: COLONOSCOPY WITH PROPOFOL;  Surgeon: Lanelle Bal, DO;  [facility];  Service: Endoscopy;  Laterality: N/A;  3:00pm, moved up to 9:00   CYSTOSCOPY N/A 10/29/2018   Procedure: CYSTOSCOPY, CLOT EVACUATION;  Surgeon: Rene Paci, MD;  Location: [facility];  Service: Urology;  Laterality: N/A;   PARTIAL COLECTOMY Right 08/11/2021   Procedure: PARTIAL COLECTOMY, OPEN RIGHT HEMICOLECTOMY;  Surgeon: Lucretia Roers, MD;  Location: [facility];  Service: General;  Laterality: Right;   POLYPECTOMY  05/31/2017   Procedure: POLYPECTOMY;  Surgeon: Corbin Ade, MD;  [facility];  Service: Endoscopy;;   RIGHT/LEFT HEART CATH  AND CORONARY ANGIOGRAPHY N/A 10/22/2021   Procedure: RIGHT/LEFT HEART CATH AND CORONARY ANGIOGRAPHY;  Surgeon: Orbie Pyo, MD;  [facility]: [facility];  Service: Cardiovascular;  Laterality: N/A;   TONSILLECTOMY     TRANSCAROTID ARTERY REVASCULARIZATION  Right 02/15/2021   Procedure: RIGHT TRANSCAROTID ARTERY REVASCULARIZATION;  Surgeon: Cephus Shelling, MD;  Location: [facility] OR;  Service: Vascular;  Laterality: Right;   ULTRASOUND GUIDANCE FOR VASCULAR ACCESS Left 02/15/2021   Procedure: ULTRASOUND GUIDANCE FOR VASCULAR  ACCESS;  Surgeon: Cephus Shelling, MD;  Location: [facility] OR;  Service: Vascular;  Laterality: Left;   XI ROBOTIC ASSISTED SIMPLE PROSTATECTOMY N/A 10/29/2018   Procedure: XI ROBOTIC ASSISTED SIMPLE PROSTATECTOMY;  Surgeon: Malen Gauze, MD;  Location: [facility];  Service: Urology;  Laterality: N/A;     Home Medications:  Prior to Admission medications   Medication Sig Start Date End Date Taking? Authorizing Provider  acetaminophen (TYLENOL) 500 MG tablet Take 1,000 mg by mouth daily as needed for fever, headache or moderate pain.   Yes [provider]  albuterol (VENTOLIN HFA) 108 (90 Base) MCG/ACT inhaler Inhale 2 puffs into the lungs every 6 (six) hours as needed for wheezing or shortness of breath. 04/27/22  Yes Shahmehdi, Seyed A, MD  amLODipine (NORVASC) 5 MG tablet Take 5 mg by mouth daily.   Yes [provider]  atorvastatin (LIPITOR) 80 MG tablet Take 1 tablet (80 mg total) by mouth every evening. 05/13/22  Yes Strader, [place] M, PA-C  Budeson-Glycopyrrol-Formoterol (BREZTRI AEROSPHERE) 160-9-4.8 MCG/ACT AERO Inhale 2 puffs into the lungs 2 (two) times daily.   Yes [provider]  carvedilol (COREG) 3.125 MG tablet Take 1 tablet (3.125 mg total) by mouth 2 (two) times daily with a meal. 05/13/22  Yes Strader, [place] M, PA-C  clopidogrel (PLAVIX) 75 MG tablet Take 1 tablet (75 mg total) by mouth daily. 05/13/22  Yes Strader, [place] M, PA-C  furosemide (LASIX) 20 MG tablet Take 1 tablet (20 mg total) by mouth daily. For 3 days then take as needed for dyspnea, weight gain or lower extremity swelling. Patient taking differently: Take 20 mg by mouth daily as needed (dyspnea, weight gain, lower extremity swelling). 05/13/22  Yes Strader, [place] M, PA-C  isosorbide mononitrate (IMDUR) 60 MG 24 hr tablet Take 1 tablet (60 mg total) by mouth every evening. Patient not taking: Reported on 07/24/2022 05/13/22   Ellsworth Lennox, PA-C    Inpatient  Medications: Scheduled Meds:  atorvastatin  40 mg Oral QPM   carvedilol  3.125 mg Oral BID WC   Chlorhexidine Gluconate Cloth  6 each Topical Daily   clopidogrel  75 mg Oral Daily   furosemide  60 mg Intravenous Q12H   heparin  5,000 Units Subcutaneous Q8H   isosorbide mononitrate  60 mg Oral QPM   sodium bicarbonate  650 mg Oral BID   sodium chloride flush  3 mL Intravenous Q12H   Continuous Infusions:  magnesium sulfate bolus IVPB     PRN Meds: acetaminophen **OR** acetaminophen, bisacodyl, hydrALAZINE, HYDROmorphone (DILAUDID) injection, ipratropium, levalbuterol, ondansetron **OR** ondansetron (ZOFRAN) IV, mouth rinse, oxyCODONE, senna-docusate, sodium phosphate, traZODone  Allergies:   No Known Allergies  Social History:   Social History   Socioeconomic History   Marital status: Single    Spouse name: Not on file   Number of children: Not on file   Years of education: Not on file   Highest education level: Not on file  Occupational History  Not on file  Tobacco Use   Smoking status: Former    Packs/day: 0.50    Years: 40.00    Additional pack years: 0.00    Total pack years: 20.00    Types: Cigarettes    Quit date: 07/02/2021    Years since quitting: 1.0    Passive exposure: Never   Smokeless tobacco: Never   Tobacco comments:    Quit on 06/09/21  Vaping Use   Vaping Use: Never used  Substance and Sexual Activity   Alcohol use: Not Currently   Drug use: Never   Sexual activity: Not Currently  Other Topics Concern   Not on file  Social History Narrative   Not on file   Social Determinants of Health   Financial Resource Strain: Low Risk  (12/11/2019)   Overall Financial Resource Strain (CARDIA)    Difficulty of Paying Living Expenses: Not hard at all  Food Insecurity: No Food Insecurity (07/24/2022)   Hunger Vital Sign    Worried About Running Out of Food in the Last Year: Never true    Ran Out of Food in the Last Year: Never true  Transportation Needs:  No Transportation Needs (07/24/2022)   PRAPARE - Administrator, Civil Service (Medical): No    Lack of Transportation (Non-Medical): No  Recent Concern: Transportation Needs - Unmet Transportation Needs (05/05/2022)   PRAPARE - Transportation    Lack of Transportation (Medical): Yes    Lack of Transportation (Non-Medical): Yes  Physical Activity: Inactive (12/11/2019)   Exercise Vital Sign    Days of Exercise per Week: 0 days    Minutes of Exercise per Session: 0 min  Stress: No Stress Concern Present (12/11/2019)   [employer] of Occupational Health - Occupational Stress Questionnaire    Feeling of Stress : Not at all  Social Connections: Socially Isolated (12/11/2019)   Social Connection and Isolation Panel [NHANES]    Frequency of Communication with Friends and Family: Never    Frequency of Social Gatherings with Friends and Family: Never    Attends Religious Services: Never    Database administrator or Organizations: No    Attends Banker Meetings: Never    Marital Status: Divorced  Catering manager Violence: Not At Risk (07/24/2022)   Humiliation, Afraid, Rape, and Kick questionnaire    Fear of Current or Ex-Partner: No    Emotionally Abused: No    Physically Abused: No    Sexually Abused: No    Family History:     Family History  Problem Relation Age of Onset   Stroke Father    Cirrhosis Mother    Colon cancer Neg Hx      ROS:  Please see the history of present illness. Poor historian Review of Systems  Constitutional: Negative.  HENT: Negative.    Cardiovascular:  Positive for dyspnea on exertion, leg swelling and orthopnea.  Respiratory:  Positive for cough and shortness of breath.   Endocrine: Negative.   Hematologic/Lymphatic: Negative.   Musculoskeletal: Negative.   Gastrointestinal: Negative.   Genitourinary: Negative.   Neurological: Negative.     All other ROS reviewed and negative.     Physical Exam/Data:   Vitals:    07/25/22 0713 07/25/22 0715 07/25/22 0729 07/25/22 0730  BP:   (!) 153/84   Pulse: (!) 59 (!) 112 80 98  Resp: (!) 29 (!) 31  (!) 26  Temp: 97.9 F (36.6 C)     TempSrc: Oral  SpO2: 99% 98%  96%  Weight:      Height:        Intake/Output Summary (Last 24 hours) at 07/25/2022 0747 Last data filed at 07/25/2022 7253 Gross per 24 hour  Intake 120 ml  Output 1200 ml  Net -1080 ml      07/25/2022    4:30 AM 07/24/2022    4:22 PM 07/24/2022   11:03 AM  Last 3 Weights  Weight (lbs) 153 lb 7 oz 153 lb 7 oz 146 lb 9.7 oz  Weight (kg) 69.6 kg 69.6 kg 66.5 kg     Body mass index is 20.81 kg/m.  General:  Thin, in no acute distress  HEENT: normal -scar and deformity from cancer removal Neck: no JVD Vascular: No carotid bruits; Distal pulses 2+ bilaterally Cardiac:  normal S1, S2; RRR; some skipping, 1/6 systolic murmur Lungs:  decreased breath sound Abd: soft, nontender, no hepatomegaly  Ext: no edema Musculoskeletal:  No deformities, BUE and BLE strength normal and equal Skin: warm and dry  Neuro:  CNs 2-12 intact, no focal abnormalities noted Psych:  Normal affect   EKG:  The EKG was personally reviewed and demonstrates:  NSR with lateral TWI unchanged Telemetry:  Telemetry was personally reviewed and demonstrates:  NSR with sinus arrhythmias, NSVT up to 5 beat runs  Relevant CV Studies:   R/LHC: 10/2021 Mid RCA lesion is 70% stenosed.   Acute Mrg lesion is 80% stenosed.   1.  Mild to moderate obstructive coronary artery disease that is outweighed by the patient's degree of LV dysfunction.  There is a significant bifurcation lesion of the right coronary artery and large RV marginal branch.  This is moderately calcified and relatively tortuous.  Given the patient's lack of chest pain, medical therapy should be pursued. 2.  Cardiac output of 6.3 L/min, cardiac index of 3.3 L/min/m, mean RA pressure of 16 mmHg, mean wedge pressure of 21 mmHg, and LVEDP of 21 mmHg.    Recommendation: Goal-directed medical therapy with augmentation of diuretics; consider dual antiplatelet therapy for 1 year for medical treatment of acute coronary syndrome.   Echocardiogram: 04/2022 IMPRESSIONS     1. Left ventricular ejection fraction, by estimation, is 35 to 40%. The  left ventricle has moderately decreased function. The left ventricle  demonstrates global hypokinesis. Left ventricular diastolic parameters are  indeterminate.   2. Right ventricular systolic function is normal. The right ventricular  size is normal. There is normal pulmonary artery systolic pressure.   3. The mitral valve is normal in structure. No evidence of mitral valve  regurgitation. No evidence of mitral stenosis.   4. The aortic valve has an indeterminant number of cusps. There is mild  calcification of the aortic valve. There is mild thickening of the aortic  valve. Aortic valve regurgitation is not visualized. No aortic stenosis is  present.   5. The inferior vena cava is normal in size with greater than 50%  respiratory variability, suggesting right atrial pressure of 3 mmHg.    Laboratory Data:  High Sensitivity Troponin:  No results for input(s): "TROPONINIHS" in the last 720 hours.   Chemistry Recent Labs  Lab 07/24/22 1130 07/24/22 1210 07/25/22 0337  NA  --  138 134*  K  --  4.2 2.8*  CL  --  106 104  CO2  --  21* 21*  GLUCOSE  --  148* 133*  BUN  --  44* 42*  CREATININE  --  2.33* 1.96*  CALCIUM  --  8.2* 7.4*  MG 2.0  --   --   GFRNONAA  --  30* 37*  ANIONGAP  --  11 9    Recent Labs  Lab 07/24/22 1210 07/25/22 0337  PROT 6.3* 5.5*  ALBUMIN 3.0* 2.6*  AST 56* 35  ALT 216* 136*  ALKPHOS 105 86  BILITOT 2.8* 2.0*   Lipids  Recent Labs  Lab 07/24/22 1130  CHOL 62  TRIG 60  HDL 32*  LDLCALC 18  CHOLHDL 1.9    Hematology Recent Labs  Lab 07/24/22 1130 07/25/22 0337  WBC 12.5* 13.6*  RBC 4.55 3.80*  HGB 13.1 10.9*  HCT 41.7 34.7*  MCV 91.6 91.3   MCH 28.8 28.7  MCHC 31.4 31.4  RDW 15.1 14.9  PLT 270 257   Thyroid No results for input(s): "TSH", "FREET4" in the last 168 hours.  BNP Recent Labs  Lab 07/24/22 1130 07/25/22 0337  BNP >4,500.0* >4,500.0*    DDimer No results for input(s): "DDIMER" in the last 168 hours.   Radiology/Studies:  DG Chest Port 1 View  Result Date: 07/24/2022 CLINICAL DATA:  Shortness of breath. EXAM: PORTABLE CHEST 1 VIEW COMPARISON:  07/19/2022 FINDINGS: The cardio pericardial silhouette is enlarged. Interstitial markings are diffusely coarsened with chronic features. No overt airspace pulmonary edema or focal consolidation. Hazy opacity at the left base may be atelectasis and/or layering pleural fluid. Telemetry leads overlie the chest. IMPRESSION: 1. Chronic interstitial coarsening without overt airspace pulmonary edema. 2. Possible layering left pleural effusion. Electronically Signed   By: Kennith Center M.D.   On: 07/24/2022 11:59   US Abdomen Limited RUQ (LIVER/GB)  Result Date: 07/21/2022 CLINICAL DATA:  Abnormal liver function tests EXAM: ULTRASOUND ABDOMEN LIMITED RIGHT UPPER QUADRANT COMPARISON:  CT done on 01/25/2022 FINDINGS: Gallbladder: There is large echogenic structure measuring 3.2 cm with acoustic shadowing in gallbladder fossa suggesting large gallbladder stone. Technologist did not observe any tenderness over the gallbladder fossa. Gallbladder is not distended. Common bile duct: Diameter: 4 mm Liver: There is increased echogenicity in liver. No focal abnormalities are seen in visualized portions of liver. Portal vein is patent on color Doppler imaging with normal direction of blood flow towards the liver. Other: None. IMPRESSION: Large gallbladder stone is noted filling the gallbladder. There are no imaging signs of acute cholecystitis. There is no dilation of bile ducts. There is increased echogenicity in liver suggesting possible fatty infiltration. Electronically Signed   By: Ernie Avena M.D.   On: 07/21/2022 10:03     Assessment and Plan:    HFrEF -  known cardiomyopathy with an EF at 35 to 40% by most recent imaging. Medical management has been limited secondary to orthostatic hypotension, CKD and medication noncompliance although he says he's taking his meds -BNP>4500 on Lasix 40 mg IV BID after 80 mg IV in ED, much improved. -Continue IV lasix today and replace K - negative 1 L   CAD - cardiac catheterization in 10/2021 showing mild to moderate obstructive CAD along the mid-RCA and acute Mrg with medical management recommended. - Continue current medical therapy with Plavix 75 mg daily, Imdur 60 mg daily ( Atorvastatin 80 mg daily on hold with elevated LFT's).  - Could try to increase Coreg 6.25 mg twice daily.   HTN - His BP eleavated on admission and last OV but history of orthostatic hypotension during prior admissions   HLD - His LDL was at 64 in 07/2021. He has recently restarted  Atorvastatin 80 mg daily and now on hold with elevated LFT's   Stage 3 CKD - Baseline creatinine appears to be between 1.7 - 1.9. Stable at 1.96. K 2.8 this am will replace  Elevated LFT's- large gallstone on [place] but no acute cholelithiasis-lipitor on hold     Risk Assessment/Risk Scores:        New York Heart Association (NYHA) Functional Class NYHA Class IV        For questions or updates, please contact [facility] Please consult [email or web address] for contact info under    Signed, Jacolyn Reedy, PA-C  07/25/2022 7:47 AM

## 2022-07-25 NOTE — Progress Notes (Signed)
Central tele called back and stated that before patient had 16 beat run of V Tach he was having a lot of ectopy. I informed tele that Dr. Flossie Dibble had d/c'd the tele order. This Clinical research associate notified Dr. Flossie Dibble for ectopy prior to V Tach. No new orders.

## 2022-07-25 NOTE — Progress Notes (Signed)
Patient arrived to room 331 around 1224. He is a&o x4 and has no complaints. He is ambulatory in room.

## 2022-07-25 NOTE — Progress Notes (Addendum)
PROGRESS NOTE    Patient: Michael [name]                            PCP: Benita Stabile, MD                    DOB: 04-Dec-1955            DOA: 07/24/2022 [id number]             DOS: 07/25/2022, 12:57 PM   LOS: 1 day   Date of Service: The patient was seen and examined on 07/25/2022  Subjective:   The patient was seen and examined this morning. Hemodynamically stable. No issues overnight .  Pt denies any shortness of breath and is breathing comfortably on room air. He notes that his cough is improving and he is not producing any sputum. Endorses mild left ankle swelling, denies any pain in left calf.   Denies any other concerns.  Brief Narrative:   Michael [name] Is a 67 year old male with a history of HFrEF, (EJF35-40% ): CKD stage III, coronary disease, COPD, tobacco abuse, basal cell carcinoma status post partial rhinectomy and maxillectomy 06/06/2017 at [facility], advanced colonic tubular adenoma, right carotid stenosis, hypertension, presenting with shortness of breath. Apparently patient was diagnosed with pneumonia last week, has been taking his antibiotics, but he is progressively getting more shortness of breath past 3-4 days.  Reporting shortness of breath at rest, and with minimal exertion gets worse.   Multiple hospitalization in past 2 months for shortness of breath, COPD exacerbation Hospitalization, recent discharge 05/06/2022, also admission and discharged 04/29/2018 for COPD exacerbation, shortness of breath, and syncope.    Assessment & Plan:   Principal Problem:   Acute on chronic systolic CHF (congestive heart failure) (HCC) Active Problems:   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   Hypokalemia   Essential hypertension   Anemia   Acute hypoxemic respiratory failure (HCC)   COPD with acute exacerbation (HCC)   Acute renal failure superimposed on chronic kidney disease (HCC)   Chronic HFrEF (heart failure with reduced ejection fraction) (HCC)   Carotid  stenosis, asymptomatic, right   CAD (coronary artery disease)   Chronic kidney disease, stage 3b (HCC)     Assessment and Plan: Cardiovascular and Mediastinum CAD (coronary artery disease) Assessment & Plan Holding statins due to elevated LFTs, appreciate Cardiology's recs.  - Will continue 75mg  Plavix.   Essential hypertension Assessment & Plan BP 124/59-167/103.  - Will resume Coreg 3.125mg .   * Acute on chronic systolic CHF (congestive heart failure) [facility]) Assessment & Plan Most recent Echo from March 2024 with EF at 35-40%. Was net negative yesterday. Hypervolemic on exam with elevated JVD, pedal edema, crackles. Appreciate cardiology's recommendations. - IV Lasix 80mg  BID - Repleting Potassium for >4, Mg>2. -Will monitor I's and O's closely along with his daily weight -Currently per cardiology will continue his beta-blocker of Coreg and Imdur Not a candidate for ACE/ARB/SGLT2 due to elevated creatinine  Genitourinary Acute renal failure superimposed on chronic kidney disease (HCC) Assessment & Plan Cr has improved from 2.33 to 1.96, at his baseline of 1.7-2.0. - Will monitor closely and avoid nephrotoxic therapies Lab Results  Component Value Date   CREATININE 1.96 (H) 07/25/2022   CREATININE 2.33 (H) 07/24/2022   CREATININE 1.84 (H) 05/07/2022     Hypokalemia -Serum potassium 2.8 >> repleting with 40 mill equivalent of Kcl Checking magnesium  Tobacco abuse -Tobacco cessation discussed   Hyperlipidemia  -Continue high-dose statins     Right Carotid stenosis -Will continue Plavix -High-dose statin is recommended, on hold due to his elevated LFTs -The patient has follow-up with Dr. Leonette Most fields -05/12/2017 carotid ultrasound shows 80-99% right carotid stenosis -70% heavily calcified RICA stenosis and no significant left-sided disease by CT angiography.   - He is now following with Dr. Olga Coaster was not felt to be a candidate for carotid stenting  and suboptimal candidate for endarterectomy.      Basal cell carcinoma of nasolabial fold -06/06/2017--right partial rhinectomy, resection of left intranasal lesion, skin graft @[facility] -Margins were clear on pathology -Most recent follow-up was 06/23/2017   Iron deficiency anemia/Advanced colonic adenoma -The patient had colonoscopy 05/31/2017--Dr. Rourk -He was noted to have numerous polyps as well as a polypoid mass in the hepatic flexure to the cecum area -pathology of mass--tubular adenoma -He was referred to general surgery, Dr. Larae Grooms for hemicolectomy -He has been lost to follow-up  ----------------------------------------------------------------------------------------------------------------------------------------------- Nutritional status:  The patient's BMI is: Body mass index is 22.02 kg/m. I agree with the assessment and plan as outlined below: Nutrition Status: Regular diet.   ---------------------------------------------------------------------------------------------------------------------------------------------------- Cultures; No culture data. MRSA and Resp panel negative   ------------------------------------------------------------------------------------------------------------------------------------------------  DVT prophylaxis:  heparin injection 5,000 Units Start: 07/24/22 2200 TED hose Start: 07/24/22 1422 SCDs Start: 07/24/22 1422   Code Status:   Code Status: Full Code  Family Communication: No family member present at bedside- attempt will be made to update daily The above findings and plan of care has been discussed with patient (and family)  in detail,  they expressed understanding and agreement of above. -Advance care planning has been discussed.   Admission status:   Status is: Inpatient Remains inpatient appropriate because need for continued IV diuresis.   Disposition: From  - home             Planning for discharge in 1-2  days: to Home   Procedures:   No admission procedures for hospital encounter.   Antimicrobials:  Anti-infectives (From admission, onward)    None        Medication:   [START ON 07/26/2022] atorvastatin  40 mg Oral QPM   carvedilol  3.125 mg Oral BID WC   Chlorhexidine Gluconate Cloth  6 each Topical Daily   clopidogrel  75 mg Oral Daily   furosemide  60 mg Intravenous Q12H   heparin  5,000 Units Subcutaneous Q8H   isosorbide mononitrate  60 mg Oral QPM   sodium bicarbonate  650 mg Oral BID   sodium chloride flush  3 mL Intravenous Q12H    acetaminophen **OR** acetaminophen, bisacodyl, hydrALAZINE, HYDROmorphone (DILAUDID) injection, ipratropium, levalbuterol, ondansetron **OR** ondansetron (ZOFRAN) IV, mouth rinse, oxyCODONE, senna-docusate, sodium phosphate, traZODone   Objective:   Vitals:   07/25/22 1029 07/25/22 1059 07/25/22 1129 07/25/22 1223  BP:    134/84  Pulse: (!) 57 (!) 57 (!) 141 68  Resp: (!) 22  20 20   Temp:   98.1 F (36.7 C) 98.1 F (36.7 C)  TempSrc:   Oral Oral  SpO2: 95% 94% 95% 96%  Weight:      Height:        Intake/Output Summary (Last 24 hours) at 07/25/2022 1257 Last data filed at 07/25/2022 0738 Gross per 24 hour  Intake 120 ml  Output 1200 ml  Net -1080 ml   Filed Weights   07/24/22 1622 07/25/22 0430 07/25/22 0800  Weight: 69.6 kg 69.6 kg 69.6 kg     Physical examination:   Constitution:  Alert, cooperative, no distress,  Appears calm and comfortable  Psychiatric:   Normal and stable mood and affect, cognition intact,   HEENT:        Normocephalic, PERRL, post-surgical changes to nose  Cardio vascular:  Elevated JVD. S1/S2, RRR, No murmur, No Rubs or Gallops  pulmonary: Fine crackles bilaterally, respirations unlabored Abdomen: Soft, non-tender, non-distended, bowel sounds,no masses, no organomegaly Muscular skeletal: Limited exam - in bed, able to move all 4 extremities,   Extremities: 1+ pitting edema lower extremities,  +2 pulses  Skin: Dry, warm to touch, negative for any Rashes, No open wounds Wounds: per nursing documentation   ------------------------------------------------------------------------------------------------------------------------------------------    LABs:     Latest Ref Rng & Units 07/25/2022    3:37 AM 07/24/2022   11:30 AM 05/07/2022    3:38 AM  CBC  WBC 4.0 - 10.5 K/uL 13.6  12.5  12.8   Hemoglobin 13.0 - 17.0 g/dL 19.1  47.8  29.5   Hematocrit 39.0 - 52.0 % 34.7  41.7  41.3   Platelets 150 - 400 K/uL 257  270  PLATELETS APPEAR ADEQUATE       Latest Ref Rng & Units 07/25/2022    3:37 AM 07/24/2022   12:10 PM 05/07/2022    3:38 AM  CMP  Glucose 70 - 99 mg/dL 621  308  657   BUN 8 - 23 mg/dL 42  44  45   Creatinine 0.61 - 1.24 mg/dL 8.46  9.62  9.52   Sodium 135 - 145 mmol/L 134  138  136   Potassium 3.5 - 5.1 mmol/L 2.8  4.2  3.8   Chloride 98 - 111 mmol/L 104  106  103   CO2 22 - 32 mmol/L 21  21  22    Calcium 8.9 - 10.3 mg/dL 7.4  8.2  8.3   Total Protein 6.5 - 8.1 g/dL 5.5  6.3    Total Bilirubin 0.3 - 1.2 mg/dL 2.0  2.8    Alkaline Phos 38 - 126 U/L 86  105    AST 15 - 41 U/L 35  56    ALT 0 - 44 U/L 136  216         Micro Results Recent Results (from the past 240 hour(s))  Resp panel by RT-PCR (RSV, Flu A&B, Covid) Anterior Nasal Swab     Status: None   Collection Time: 07/24/22 11:47 AM   Specimen: Anterior Nasal Swab  Result Value Ref Range Status   SARS Coronavirus 2 by RT PCR NEGATIVE NEGATIVE Final    Comment: (NOTE) SARS-CoV-2 target nucleic acids are NOT DETECTED.  The SARS-CoV-2 RNA is generally detectable in upper respiratory specimens during the acute phase of infection. The lowest concentration of SARS-CoV-2 viral copies this assay can detect is 138 copies/mL. A negative result does not preclude SARS-Cov-2 infection and should not be used as the sole basis for treatment or other patient management decisions. A negative result may occur with   improper specimen collection/handling, submission of specimen other than nasopharyngeal swab, presence of viral mutation(s) within the areas targeted by this assay, and inadequate number of viral copies(<138 copies/mL). A negative result must be combined with clinical observations, patient history, and epidemiological information. The expected result is Negative.  Fact Sheet for Patients:  BloggerCourse.com  Fact Sheet for Healthcare Providers:  SeriousBroker.it  This test is no t yet  approved or cleared by the [place] and  has been authorized for detection and/or diagnosis of SARS-CoV-2 by FDA under an Emergency Use Authorization (EUA). This EUA will remain  in effect (meaning this test can be used) for the duration of the COVID-19 declaration under Section 564(b)(1) of the Act, 21 U.S.C.section 360bbb-3(b)(1), unless the authorization is terminated  or revoked sooner.       Influenza A by PCR NEGATIVE NEGATIVE Final   Influenza B by PCR NEGATIVE NEGATIVE Final    Comment: (NOTE) The Xpert Xpress SARS-CoV-2/FLU/RSV plus assay is intended as an aid in the diagnosis of influenza from Nasopharyngeal swab specimens and should not be used as a sole basis for treatment. Nasal washings and aspirates are unacceptable for Xpert Xpress SARS-CoV-2/FLU/RSV testing.  Fact Sheet for Patients: BloggerCourse.com  Fact Sheet for Healthcare Providers: SeriousBroker.it  This test is not yet approved or cleared by the [place] FDA and has been authorized for detection and/or diagnosis of SARS-CoV-2 by FDA under an Emergency Use Authorization (EUA). This EUA will remain in effect (meaning this test can be used) for the duration of the COVID-19 declaration under Section 564(b)(1) of the Act, 21 U.S.C. section 360bbb-3(b)(1), unless the authorization is terminated or revoked.      Resp Syncytial Virus by PCR NEGATIVE NEGATIVE Final    Comment: (NOTE) Fact Sheet for Patients: BloggerCourse.com  Fact Sheet for Healthcare Providers: SeriousBroker.it  This test is not yet approved or cleared by the [place] FDA and has been authorized for detection and/or diagnosis of SARS-CoV-2 by FDA under an Emergency Use Authorization (EUA). This EUA will remain in effect (meaning this test can be used) for the duration of the COVID-19 declaration under Section 564(b)(1) of the Act, 21 U.S.C. section 360bbb-3(b)(1), unless the authorization is terminated or revoked.  Performed at [facility], [address], [place], [place] [address]   MRSA Next Gen by PCR, Nasal     Status: None   Collection Time: 07/24/22  7:30 PM   Specimen: Nasal Mucosa; Nasal Swab  Result Value Ref Range Status   MRSA by PCR Next Gen NOT DETECTED NOT DETECTED Final    Comment: (NOTE) The GeneXpert MRSA Assay (FDA approved for NASAL specimens only), is one component of a comprehensive MRSA colonization surveillance program. It is not intended to diagnose MRSA infection nor to guide or monitor treatment for MRSA infections. Test performance is not FDA approved in patients less than 29 years old. Performed at [facility], [address], [place], [place] [address]     Radiology Reports No results found.  Marcelline Mates, MS4 Working with Dr. Nevin Bloodgood    The patient was seen and examined independently, plan of care was discussed with student in detail. The note was addended accordingly.   SIGNED: Kendell Bane, MD, FHM. FAAFP [facility],  Pager (please use [email or web address] to page/text)  Please use Epic Secure Chat for non-urgent communication (7AM-7PM) If 7PM-7AM, please contact night-coverage [email or web address],  07/25/2022, 12:57 PM

## 2022-07-25 NOTE — TOC Initial Note (Signed)
Transition of Care [facility]) - Initial/Assessment Note    Patient Details  Name: Michael [name] MRN: [id number] Date of Birth: May 13, 1955  Transition of Care [facility]) CM/SW Contact:    Leitha Bleak, RN Phone Number: 07/25/2022, 1:16 PM  Clinical Narrative:    Patient admitted with acute on chronic systolic CHF. Patient has a high risk for readmission. [name] neighbor gave history. Patient had no place to live, he built a room on the back of his house for the patient. Patient has a son, they do not speak. Son does not want to be contacted for any reason. Neighbor care of the patient and and provide transportation. PT is recommending HHPT. He is agreeable that patient needs PT to get stronger. Referral made to Sarah with [facility].  Palliative consulted and recommended out patient palliative. Maralyn Sago will have PT assess and add and nursing for palliative discussions.            Expected Discharge Plan: Home w Home Health Services Barriers to Discharge: Continued Medical Work up   Patient Goals and CMS Choice Patient states their goals for this hospitalization and ongoing recovery are:: to return home. CMS Medicare.gov Compare Post Acute Care list provided to:: Patient        Expected Discharge Plan and Services       Living arrangements for the past 2 months: Single Family Home            HH Arranged: PT   Date HH Agency Contacted: 07/25/22 Time HH Agency Contacted: 1100 Representative spoke with at [facility] Agency: Versie Starks  Prior Living Arrangements/Services Living arrangements for the past 2 months: Single Family Home Lives with:: Other (Comment) Biomedical engineer) Patient language and need for interpreter reviewed:: Yes Do you feel safe going back to the place where you live?: Yes      Need for Family Participation in Patient Care: Yes (Comment) Care giver support system in place?: Yes (comment)   Criminal Activity/Legal Involvement Pertinent to Current  Situation/Hospitalization: No - Comment as needed  Activities of Daily Living Home Assistive Devices/Equipment: None ADL Screening (condition at time of admission) Patient's cognitive ability adequate to safely complete daily activities?: Yes Is the patient deaf or have difficulty hearing?: No Does the patient have difficulty seeing, even when wearing glasses/contacts?: No Does the patient have difficulty concentrating, remembering, or making decisions?: No Patient able to express need for assistance with ADLs?: Yes Does the patient have difficulty dressing or bathing?: No Independently performs ADLs?: Yes (appropriate for developmental age) Communication: Independent Dressing (OT): Independent Grooming: Independent Feeding: Independent Bathing: Independent Toileting: Independent In/Out Bed: Independent Walks in Home: Independent Does the patient have difficulty walking or climbing stairs?: No Weakness of Legs: None Weakness of Arms/Hands: None  Permission Sought/Granted            Permission granted to share info w Contact Information: neighbor  Emotional Assessment       Orientation: : Oriented to Self, Oriented to Situation Alcohol / Substance Use: Alcohol Use Psych Involvement: No (comment)  Admission diagnosis:  COPD exacerbation (HCC) [J44.1] Acute on chronic systolic congestive heart failure (HCC) [I50.23] Acute on chronic systolic CHF (congestive heart failure) (HCC) [I50.23] Acute on chronic combined systolic and diastolic CHF (congestive heart failure) (HCC) [I50.43] Carotid stenosis, asymptomatic, right [I65.21] Patient Active Problem List   Diagnosis Date Noted   COPD exacerbation (HCC) 05/06/2022   Acute on chronic systolic CHF (congestive heart failure) (HCC) 05/05/2022   Troponin level elevated 04/29/2022  Elevated brain natriuretic peptide (BNP) level 04/29/2022   Pulmonary nodule 04/26/2022   Transaminitis 10/23/2021   Pressure injury of skin  10/22/2021   Type 2 MI (myocardial infarction) (HCC) 10/20/2021   Colonic mass 08/06/2021   Preoperative cardiovascular examination 08/03/2021   CAD (coronary artery disease) 08/03/2021   Chronic kidney disease, stage 3b (HCC) 08/03/2021   Aortic atherosclerosis (HCC) 08/03/2021   Rectal bleeding 06/16/2021   H/O adenomatous polyp of colon 06/16/2021   Carotid stenosis, asymptomatic, right 02/15/2021   Basal cell carcinoma of canthus, right 12/06/2019   Incisional hernia, without obstruction or gangrene 11/15/2019   Elevated PSA 06/19/2019   Benign prostatic hyperplasia with urinary obstruction 10/29/2018   Chronic HFrEF (heart failure with reduced ejection fraction) (HCC) 05/09/2018   Lobar pneumonia (HCC) 05/09/2018   Urinary retention 11/27/2017   Vasovagal syncope 09/17/2017   Syncope and collapse 07/05/2017   COPD with acute exacerbation (HCC) 07/05/2017   Acute renal failure superimposed on chronic kidney disease (HCC) 07/05/2017   Catheter-associated urinary tract infection (HCC) 07/05/2017   Leukocytosis 07/05/2017   Urinary retention with incomplete bladder emptying 07/05/2017   UTI (urinary tract infection) 07/05/2017   Orthostatic syncope    Syncope due to orthostatic hypotension 06/24/2017   Heart murmur 06/24/2017   Abnormal PET scan of colon 05/25/2017   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 05/15/2017   Periapical abscess 04/04/2017   Iron (Fe) deficiency anemia 04/04/2017   Essential hypertension 04/02/2017   Anemia 04/02/2017   Acute hypoxemic respiratory failure (HCC) 04/02/2017   Tobacco abuse 04/02/2017   Thrombocytosis 04/02/2017   Hypokalemia 04/02/2017   Skin lesion of face    Nasal lesion    PCP:  Benita Stabile, MD Pharmacy:   [facility] [place] [place] - [address] [place] [address] Phone: [phone] Fax: [phone]     Social Determinants of Health (SDOH) Social History: SDOH Screenings    Food Insecurity: No Food Insecurity (07/24/2022)  Housing: Low Risk  (07/24/2022)  Recent Concern: Housing - Medium Risk (05/05/2022)  Transportation Needs: No Transportation Needs (07/24/2022)  Recent Concern: Transportation Needs - Unmet Transportation Needs (05/05/2022)  Utilities: Not At Risk (07/24/2022)  Alcohol Screen: Low Risk  (12/11/2019)  Depression (PHQ2-9): Low Risk  (12/11/2019)  Financial Resource Strain: Low Risk  (12/11/2019)  Physical Activity: Inactive (12/11/2019)  Social Connections: Socially Isolated (12/11/2019)  Stress: No Stress Concern Present (12/11/2019)  Tobacco Use: Medium Risk (05/27/2022)   SDOH Interventions:     Readmission Risk Interventions    07/25/2022    1:09 PM 04/26/2022   11:46 AM 10/26/2021    2:57 PM  Readmission Risk Prevention Plan  Transportation Screening Complete Complete Complete  PCP or Specialist Appt within 3-5 Days   Complete  HRI or Home Care Consult  Complete Complete  Social Work Consult for Recovery Care Planning/Counseling  Complete Complete  Palliative Care Screening  Not Applicable Not Applicable  Medication Review Oceanographer) Complete Complete Complete  PCP or Specialist appointment within 3-5 days of discharge Not Complete    HRI or Home Care Consult Complete    SW Recovery Care/Counseling Consult Complete    Palliative Care Screening Complete    Skilled Nursing Facility Not Applicable

## 2022-07-25 NOTE — Consult Note (Signed)
Consultation Note Date: 07/25/2022   Patient Name: Michael [name]  DOB: [birth year]  MRN: [id number]  Age / Sex: 67 y.o., male  PCP: Michael Stabile, MD Referring Physician: Kendell Bane, MD  Reason for Consultation: Establishing goals of care  HPI/Patient Profile: 67 y.o. male  with past medical history of HFrEF 35 to 40%, CKD 3, CAD, COPD, tobacco abuse, basal cell carcinoma SP partial rhinectomy and Maly ectomy May 2019 [facility], advanced colonic tubular adenoma, right carotid stenosis, HTN, 4 hospital stays and 8 ED visits in the last 6 months admitted on 07/24/2022 with acute on chronic systolic heart failure.   Clinical Assessment and Goals of Care: I have reviewed medical records including EPIC notes, labs and imaging, received report from RN, assessed the patient.  [name] is lying quietly in bed in the intensive care.  He appears acutely/chronically ill.  He greets me, making and somewhat keeping eye contact.  He is alert and oriented x 3, able to make his needs known.  There is no family/friend at bedside.  We meet at the bedside to discuss diagnosis prognosis, GOC, EOL wishes, disposition and options.  I introduced Palliative Medicine as specialized medical care for people living with serious illness. It focuses on providing relief from the symptoms and stress of a serious illness. The goal is to improve quality of life for both the patient and the family.  We discussed a brief life review of the patient.  [name] tells me that he lives "next-door".  I ask who he lives with and he tells me Michael [name] and Michael [name].  He also names his friend Michael [name].  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  He is currently living with a friend, Michael [name] and Michael [name].  He tells me that he is independent with ADLs,  does not drive because he does not have a car.  Michael [name] and he go together to get groceries.  He manages his own finances.  He is unmarried, his parents are deceased.  He has 1 child, son Michael Hua who lives in [place].  He tells me that he is not in contact with his son and  would not want him to be his responsible party.  We then focused on their current illness.  We talk about his acute on chronic heart failure.  We talked about the treatment plan.  We talk about 4 hospital visits and 8 ED visits in the last 6 months.  We also talk about his progressing facial cancer.  He tells me that he has not had an oncological follow-up and does not care to seek further care.  We also talk about his advanced colonic adenoma.  He declines surgery consult for this hospitalization stating again that he would not seek treatment.   The natural disease trajectory and expectations at EOL were discussed.  Advanced directives, concepts specific to code status, artifical feeding and hydration, and rehospitalization  were considered and discussed.  Michael [name] has continued to endorse full scope/full code.  He previously stated (March 2024) that he would not want tracheotomy.  Hospice and Palliative Care services outpatient were explained and offered, especially in light of his desire to not seek further treatment for cancer.  We talk about the benefits of "treat the treatable" hospice care.  At this point Michael [name] is reluctant to accept hospice care.  We talk about the benefits of palliative services.  He is considering options.  Discussed the importance of continued conversation with family and the medical providers regarding overall plan of care and treatment options, ensuring decisions are within the context of the patient's values and GOCs. Questions and concerns were addressed. The patient encouraged to call with questions or concerns.  PMT will continue to support holistically.  Conference with attending, bedside  nursing staff, transition of care team related to patient condition, needs, goals of care, disposition.   HCPOA   OTHER -unfortunately [name] has no contact with his son who lives in [place].  He tells me that he does not have a contact number for his son. When asked who would speak for him if he could not he tells me that he has no one.  We talk about DSS support.  TOC team updated  SUMMARY OF RECOMMENDATIONS   At this point continue full scope/full code Anticipate home with home health Will continue to rehospitalize as needed Reluctant to accept hospice care/palliative care   Code Status/Advance Care Planning: Full code  Symptom Management:  Per hospitalist, no additional needs at this time.  Palliative Prophylaxis:  Frequent Pain Assessment and Oral Care  Additional Recommendations (Limitations, Scope, Preferences): Full Scope Treatment  Psycho-social/Spiritual:  Desire for further Chaplaincy support:no Additional Recommendations: Caregiving  Support/Resources and Education on Hospice  Prognosis:  < 6 months, or less would not be surprising based on chronic illness burden, cancer burden, 4 hospital stays and 8 ED visits in the last 6 months.  Discharge Planning: Anticipate home with home health if needed, declines hospice care      Primary Diagnoses: Present on Admission:  (Resolved) Acute on chronic combined systolic and diastolic CHF (congestive heart failure) (HCC)  Acute on chronic systolic CHF (congestive heart failure) (HCC)  Acute hypoxemic respiratory failure (HCC)  Acute renal failure superimposed on chronic kidney disease (HCC)  Anemia  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  CAD (coronary artery disease)  (Resolved) Carotid artery disease (HCC)  Carotid stenosis, asymptomatic, right  Chronic HFrEF (heart failure with reduced ejection fraction) (HCC)  Chronic kidney disease, stage 3b (HCC)  COPD with acute exacerbation (HCC)  Essential  hypertension  Hypokalemia   I have reviewed the medical record, interviewed the patient and family, and examined the patient. The following aspects are pertinent.  Past Medical History:  Diagnosis Date   Aortic atherosclerosis (HCC) 08/03/2021   CAD (coronary artery disease) 08/03/2021   Cardiac catheterization September 2023 with RCA/RV marginal stenosis managed medically   Cardiomyopathy (HCC)    Carotid artery disease (HCC)    CKD (chronic kidney disease) stage 3, GFR 30-59 ml/min (HCC)    COPD (chronic obstructive pulmonary disease) (HCC)    Essential hypertension    Head and neck cancer (HCC) 2019   Right facial basal cell carcinoma s/p resection with right partial mastectomy and partal rhinectomy with skin graft (06/13/17)   Iron deficiency anemia    Urinary retention    Social History   Socioeconomic  History   Marital status: Single    Spouse name: Not on file   Number of children: Not on file   Years of education: Not on file   Highest education level: Not on file  Occupational History   Not on file  Tobacco Use   Smoking status: Former    Packs/day: 0.50    Years: 40.00    Additional pack years: 0.00    Total pack years: 20.00    Types: Cigarettes    Quit date: 07/02/2021    Years since quitting: 1.0    Passive exposure: Never   Smokeless tobacco: Never   Tobacco comments:    Quit on 06/09/21  Vaping Use   Vaping Use: Never used  Substance and Sexual Activity   Alcohol use: Not Currently   Drug use: Never   Sexual activity: Not Currently  Other Topics Concern   Not on file  Social History Narrative   Not on file   Social Determinants of Health   Financial Resource Strain: Low Risk  (12/11/2019)   Overall Financial Resource Strain (CARDIA)    Difficulty of Paying Living Expenses: Not hard at all  Food Insecurity: No Food Insecurity (07/24/2022)   Hunger Vital Sign    Worried About Running Out of Food in the Last Year: Never true    Ran Out of Food in  the Last Year: Never true  Transportation Needs: No Transportation Needs (07/24/2022)   PRAPARE - Administrator, Civil Service (Medical): No    Lack of Transportation (Non-Medical): No  Recent Concern: Transportation Needs - Unmet Transportation Needs (05/05/2022)   PRAPARE - Transportation    Lack of Transportation (Medical): Yes    Lack of Transportation (Non-Medical): Yes  Physical Activity: Inactive (12/11/2019)   Exercise Vital Sign    Days of Exercise per Week: 0 days    Minutes of Exercise per Session: 0 min  Stress: No Stress Concern Present (12/11/2019)   [employer] of Occupational Health - Occupational Stress Questionnaire    Feeling of Stress : Not at all  Social Connections: Socially Isolated (12/11/2019)   Social Connection and Isolation Panel [NHANES]    Frequency of Communication with Friends and Family: Never    Frequency of Social Gatherings with Friends and Family: Never    Attends Religious Services: Never    Database administrator or Organizations: No    Attends Engineer, structural: Never    Marital Status: Divorced   Family History  Problem Relation Age of Onset   Stroke Father    Cirrhosis [name]    Colon cancer Neg Hx    Scheduled Meds:  [START ON 07/26/2022] atorvastatin  40 mg Oral QPM   carvedilol  3.125 mg Oral BID WC   Chlorhexidine Gluconate Cloth  6 each Topical Daily   clopidogrel  75 mg Oral Daily   furosemide  60 mg Intravenous Q12H   heparin  5,000 Units Subcutaneous Q8H   isosorbide mononitrate  60 mg Oral QPM   sodium bicarbonate  650 mg Oral BID   sodium chloride flush  3 mL Intravenous Q12H   Continuous Infusions: PRN Meds:.acetaminophen **OR** acetaminophen, bisacodyl, hydrALAZINE, HYDROmorphone (DILAUDID) injection, ipratropium, levalbuterol, ondansetron **OR** ondansetron (ZOFRAN) IV, mouth rinse, oxyCODONE, senna-docusate, sodium phosphate, traZODone Medications Prior to Admission:  Prior to Admission  medications   Medication Sig Start Date End Date Taking? Authorizing Provider  acetaminophen (TYLENOL) 500 MG tablet Take 1,000 mg by mouth daily as  needed for fever, headache or moderate pain.   Yes [provider]  albuterol (VENTOLIN HFA) 108 (90 Base) MCG/ACT inhaler Inhale 2 puffs into the lungs every 6 (six) hours as needed for wheezing or shortness of breath. 04/27/22  Yes Shahmehdi, Seyed A, MD  amLODipine (NORVASC) 5 MG tablet Take 5 mg by mouth daily.   Yes [provider]  atorvastatin (LIPITOR) 80 MG tablet Take 1 tablet (80 mg total) by mouth every evening. 05/13/22  Yes Strader, [place] M, PA-C  Budeson-Glycopyrrol-Formoterol (BREZTRI AEROSPHERE) 160-9-4.8 MCG/ACT AERO Inhale 2 puffs into the lungs 2 (two) times daily.   Yes [provider]  carvedilol (COREG) 3.125 MG tablet Take 1 tablet (3.125 mg total) by mouth 2 (two) times daily with a meal. 05/13/22  Yes Strader, [place] M, PA-C  clopidogrel (PLAVIX) 75 MG tablet Take 1 tablet (75 mg total) by mouth daily. 05/13/22  Yes Strader, [place] M, PA-C  furosemide (LASIX) 20 MG tablet Take 1 tablet (20 mg total) by mouth daily. For 3 days then take as needed for dyspnea, weight gain or lower extremity swelling. Patient taking differently: Take 20 mg by mouth daily as needed (dyspnea, weight gain, lower extremity swelling). 05/13/22  Yes Strader, [place] M, PA-C  isosorbide mononitrate (IMDUR) 60 MG 24 hr tablet Take 1 tablet (60 mg total) by mouth every evening. Patient not taking: Reported on 07/24/2022 05/13/22   Ellsworth Lennox, PA-C   No Known Allergies Review of Systems  Unable to perform ROS: Other    Physical Exam Vitals and nursing note reviewed.  Constitutional:      General: He is not in acute distress.    Appearance: He is ill-appearing.  Cardiovascular:     Rate and Rhythm: Normal rate.  Pulmonary:     Effort: Pulmonary effort is normal. No tachypnea.  Skin:    General: Skin is warm  and dry.  Neurological:     Mental Status: He is alert and oriented to person, place, and time.  Psychiatric:        Mood and Affect: Mood normal.        Behavior: Behavior normal.     Vital Signs: BP 134/84 (BP Location: Right Arm)   Pulse 68   Temp 98.1 F (36.7 C) (Oral)   Resp 20   Ht 5\' 10"  (1.778 m)   Wt 69.6 kg   SpO2 96%   BMI 22.02 kg/m  Pain Scale: 0-10   Pain Score: 0-No pain   SpO2: SpO2: 96 % O2 Device:SpO2: 96 % O2 Flow Rate: .O2 Flow Rate (L/min): 2 L/min  IO: Intake/output summary:  Intake/Output Summary (Last 24 hours) at 07/25/2022 1354 Last data filed at 07/25/2022 4782 Gross per 24 hour  Intake 120 ml  Output 1200 ml  Net -1080 ml    LBM: Last BM Date : 07/24/22 Baseline Weight: Weight: 66.5 kg Most recent weight: Weight: 69.6 kg     Palliative Assessment/Data:     Time In: 1040 Time Out: 1135 Time Total: 55 minutes  Greater than 50%  of this time was spent counseling and coordinating care related to the above assessment and plan.  Signed by: Katheran Awe, NP   Please contact Palliative Medicine Team phone at [phone] for questions and concerns.  For individual provider: See Loretha Stapler

## 2022-07-25 NOTE — Evaluation (Signed)
Physical Therapy Evaluation Patient Details Name: Michael [name] MRN: [id number] DOB: 08-25-1955 Today's Date: 07/25/2022  History of Present Illness  Michael [name] Is a 67 year old male with a history of HFrEF, (EJF35-40% ): CKD stage III, coronary disease, COPD, tobacco abuse, basal cell carcinoma status post partial rhinectomy and maxillectomy 06/06/2017 at [facility], advanced colonic tubular adenoma, right carotid stenosis, hypertension, presenting with shortness of breath.  Apparently patient was diagnosed with pneumonia last week, has been taking his antibiotics, but he is progressively getting more shortness of breath past 3-4 days.  Reporting shortness of breath at rest, and with minimal exertion gets worse.   Clinical Impression  Patient functioning near baseline for functional mobility and gait other than occasional stumbling during ambulation without loss of balance and required increased time for completing functional tasks.  Patient tolerated sitting up in chair after therapy.  Patient will benefit from continued skilled physical therapy in hospital and recommended venue below to increase strength, balance, endurance for safe ADLs and gait.          Recommendations for follow up therapy are one component of a multi-disciplinary discharge planning process, led by the attending physician.  Recommendations may be updated based on patient status, additional functional criteria and insurance authorization.  Follow Up Recommendations       Assistance Recommended at Discharge Set up Supervision/Assistance  Patient can return home with the following  A little help with walking and/or transfers;Help with stairs or ramp for entrance;Assistance with cooking/housework    Equipment Recommendations None recommended by PT  Recommendations for Other Services       Functional Status Assessment Patient has had a recent decline in their functional status and demonstrates the ability to make  significant improvements in function in a reasonable and predictable amount of time.     Precautions / Restrictions Precautions Precautions: Fall Restrictions Weight Bearing Restrictions: No      Mobility  Bed Mobility Overal bed mobility: Modified Independent             General bed mobility comments: HOB raised    Transfers Overall transfer level: Needs assistance Equipment used: None, 1 person hand held assist Transfers: Sit to/from Stand, Bed to chair/wheelchair/BSC Sit to Stand: Supervision   Step pivot transfers: Supervision       General transfer comment: slightly labored movement with increased time    Ambulation/Gait Ambulation/Gait assistance: Supervision Gait Distance (Feet): 100 Feet Assistive device: None Gait Pattern/deviations: Decreased step length - right, Decreased step length - left, Decreased stride length Gait velocity: decreased     General Gait Details: slightly labored cadence with occasional stumbling without loss of balance, had to walk slow for maintaining balance  Stairs            Wheelchair Mobility    Modified Rankin (Stroke Patients Only)       Balance Overall balance assessment: Needs assistance Sitting-balance support: Feet supported, No upper extremity supported Sitting balance-Leahy Scale: Good Sitting balance - Comments: seated at EOB   Standing balance support: During functional activity, No upper extremity supported Standing balance-Leahy Scale: Fair Standing balance comment: fair/good without AD                             Pertinent Vitals/Pain Pain Assessment Pain Assessment: No/denies pain    Home Living Family/patient expects to be discharged to:: Private residence Living Arrangements: Non-relatives/Friends Available Help at Discharge: Friend(s);Available PRN/intermittently Type of Home: House Home  Access: Ramped entrance       Home Layout: One level Home Equipment: None       Prior Function Prior Level of Function : Independent/Modified Independent             Mobility Comments: Household and short distanced community ambulator without use of an AD, does not drive ADLs Comments: Independent     Hand Dominance   Dominant Hand: Right    Extremity/Trunk Assessment   Upper Extremity Assessment Upper Extremity Assessment: Overall WFL for tasks assessed    Lower Extremity Assessment Lower Extremity Assessment: Generalized weakness    Cervical / Trunk Assessment Cervical / Trunk Assessment: Normal  Communication   Communication: No difficulties  Cognition Arousal/Alertness: Awake/alert Behavior During Therapy: WFL for tasks assessed/performed Overall Cognitive Status: Within Functional Limits for tasks assessed                                          General Comments      Exercises     Assessment/Plan    PT Assessment Patient needs continued PT services  PT Problem List Decreased strength;Decreased activity tolerance;Decreased balance;Decreased mobility       PT Treatment Interventions DME instruction;Gait training;Stair training;Functional mobility training;Therapeutic activities;Therapeutic exercise;Patient/family education;Balance training    PT Goals (Current goals can be found in the Care Plan section)  Acute Rehab PT Goals Patient Stated Goal: return home with friends to assist PT Goal Formulation: With patient Time For Goal Achievement: 07/27/22 Potential to Achieve Goals: Good    Frequency Min 2X/week     Co-evaluation               AM-PAC PT "6 Clicks" Mobility  Outcome Measure Help needed turning from your back to your side while in a flat bed without using bedrails?: None Help needed moving from lying on your back to sitting on the side of a flat bed without using bedrails?: A Little Help needed moving to and from a bed to a chair (including a wheelchair)?: None Help needed standing up from  a chair using your arms (e.g., wheelchair or bedside chair)?: None Help needed to walk in hospital room?: A Little Help needed climbing 3-5 steps with a railing? : A Little 6 Click Score: 21    End of Session   Activity Tolerance: Patient tolerated treatment well;Patient limited by fatigue Patient left: in chair;with call bell/phone within reach Nurse Communication: Mobility status PT Visit Diagnosis: Unsteadiness on feet (R26.81);Other abnormalities of gait and mobility (R26.89);Muscle weakness (generalized) (M62.81)    Time: 0981-1914 PT Time Calculation (min) (ACUTE ONLY): 31 min   Charges:   PT Evaluation $PT Eval Moderate Complexity: 1 Mod PT Treatments $Therapeutic Activity: 23-37 mins        12:20 PM, 07/25/22 Ocie Bob, MPT Physical Therapist with [facility] 336 [phone] office [phone] mobile phone

## 2022-07-25 NOTE — Progress Notes (Signed)
Central telemetry called and stated that patient had 16 beat run of V tach. Notified Dr. Flossie Dibble. VS WNL. Patient denies chest pain, ShOB, dizziness, palpations. Per Dr. Flossie Dibble d/c tele.

## 2022-07-25 NOTE — Assessment & Plan Note (Addendum)
BP better controlled after restarting home Coreg 3.125mg , 116/91-153/84. - Continue Coreg 3.125mg .

## 2022-07-25 NOTE — Progress Notes (Signed)
OT Cancellation Note  Patient Details Name: Michael [name] MRN: [id number] DOB: 09/11/1955   Cancelled Treatment:    Reason Eval/Treat Not Completed: OT screened, no needs identified, will sign off. Evaluating physical therapist reported that pt did well during their evaluation. Pt can reportedly use the bathroom and ambulate fairly well. Pt reported having no issues with ADL tasks and UE strength. Pt will be removed from the OT evaluation list.   Danie Chandler OT, MOT   Danie Chandler 07/25/2022, 8:54 AM

## 2022-07-25 NOTE — Hospital Course (Signed)
Michael [name] Is a 67 year old male with a history of HFrEF, (EJF35-40% ): CKD stage III, coronary disease, COPD, tobacco abuse, basal cell carcinoma status post partial rhinectomy and maxillectomy 06/06/2017 at [facility], advanced colonic tubular adenoma, right carotid stenosis, hypertension, presenting with shortness of breath. Apparently patient was diagnosed with pneumonia last week, has been taking his antibiotics, but he is progressively getting more shortness of breath past 3-4 days.  Reporting shortness of breath at rest, and with minimal exertion gets worse.   Multiple hospitalization in past 2 months for shortness of breath, COPD exacerbation Hospitalization, recent discharge 05/06/2022, also admission and discharged 04/29/2018 for COPD exacerbation, shortness of breath, and syncope.  Did not have pedal edema on admission but did have pulmonary edema and supplemental O2 requirement. Was appropriately diuresed during admission with weight decreasing from 69kg to 64kg, and transitioned appropriately to room air by 6/24. Received consult input from cardiology during his admission. Needed potassium repletion on day of discharge.

## 2022-07-25 NOTE — Assessment & Plan Note (Signed)
Holding statins due to elevated LFTs, per Cardiology's recs. Recommend recheck as outpatient before restarting statin. - Continue 75mg  Plavix.

## 2022-07-25 NOTE — Assessment & Plan Note (Signed)
Cr has improved from 2.33 to 1.96, at his baseline of 1.7-2.0. - Will monitor closely and avoid nephrotoxic therapies

## 2022-07-25 NOTE — Plan of Care (Signed)
  Problem: Acute Rehab PT Goals(only PT should resolve) Goal: Pt Will Go Supine/Side To Sit Outcome: Progressing Flowsheets (Taken 07/25/2022 1221) Pt will go Supine/Side to Sit:  with modified independence  Independently Goal: Patient Will Transfer Sit To/From Stand Outcome: Progressing Flowsheets (Taken 07/25/2022 1221) Patient will transfer sit to/from stand:  with modified independence  Independently Goal: Pt Will Transfer Bed To Chair/Chair To Bed Outcome: Progressing Flowsheets (Taken 07/25/2022 1221) Pt will Transfer Bed to Chair/Chair to Bed:  with modified independence  Independently Goal: Pt Will Ambulate Outcome: Progressing Flowsheets (Taken 07/25/2022 1221) Pt will Ambulate:  > 125 feet  with modified independence  with least restrictive assistive device   12:21 PM, 07/25/22 Ocie Bob, MPT Physical Therapist with [facility] [phone] office [phone] mobile phone

## 2022-07-25 NOTE — Assessment & Plan Note (Signed)
Most recent Echo from March 2024 with EF at 35-40%. Was net negative yesterday. Hypervolemic on exam with elevated JVD, pedal edema, crackles. Appreciate cardiology's recommendations. - IV Lasix 80mg  BID - Repleting Potassium for >4, Mg>2.

## 2022-07-26 ENCOUNTER — Other Ambulatory Visit: Payer: Self-pay | Admitting: *Deleted

## 2022-07-26 DIAGNOSIS — I5023 Acute on chronic systolic (congestive) heart failure: Secondary | ICD-10-CM

## 2022-07-26 DIAGNOSIS — I10 Essential (primary) hypertension: Secondary | ICD-10-CM

## 2022-07-26 DIAGNOSIS — R7989 Other specified abnormal findings of blood chemistry: Secondary | ICD-10-CM

## 2022-07-26 LAB — CBC
HCT: 36.3 % — ABNORMAL LOW (ref 39.0–52.0)
Hemoglobin: 11.4 g/dL — ABNORMAL LOW (ref 13.0–17.0)
MCH: 28.6 pg (ref 26.0–34.0)
MCHC: 31.4 g/dL (ref 30.0–36.0)
MCV: 91.2 fL (ref 80.0–100.0)
Platelets: 276 10*3/uL (ref 150–400)
RBC: 3.98 MIL/uL — ABNORMAL LOW (ref 4.22–5.81)
RDW: 14.8 % (ref 11.5–15.5)
WBC: 9.7 10*3/uL (ref 4.0–10.5)
nRBC: 0 % (ref 0.0–0.2)

## 2022-07-26 LAB — COMPREHENSIVE METABOLIC PANEL
ALT: 117 U/L — ABNORMAL HIGH (ref 0–44)
AST: 38 U/L (ref 15–41)
Albumin: 2.7 g/dL — ABNORMAL LOW (ref 3.5–5.0)
Alkaline Phosphatase: 97 U/L (ref 38–126)
Anion gap: 11 (ref 5–15)
BUN: 32 mg/dL — ABNORMAL HIGH (ref 8–23)
CO2: 27 mmol/L (ref 22–32)
Calcium: 7.7 mg/dL — ABNORMAL LOW (ref 8.9–10.3)
Chloride: 101 mmol/L (ref 98–111)
Creatinine, Ser: 1.72 mg/dL — ABNORMAL HIGH (ref 0.61–1.24)
GFR, Estimated: 43 mL/min — ABNORMAL LOW (ref >60)
Glucose, Bld: 118 mg/dL — ABNORMAL HIGH (ref 70–99)
Potassium: 2.6 mmol/L — CL (ref 3.5–5.1)
Sodium: 139 mmol/L (ref 135–145)
Total Bilirubin: 1.6 mg/dL — ABNORMAL HIGH (ref 0.3–1.2)
Total Protein: 6.1 g/dL — ABNORMAL LOW (ref 6.5–8.1)

## 2022-07-26 LAB — BRAIN NATRIURETIC PEPTIDE: B Natriuretic Peptide: 3047 pg/mL — ABNORMAL HIGH (ref 0.0–100.0)

## 2022-07-26 LAB — FERRITIN: Ferritin: 63 ng/mL (ref 24–336)

## 2022-07-26 LAB — POTASSIUM: Potassium: 3.3 mmol/L — ABNORMAL LOW (ref 3.5–5.1)

## 2022-07-26 LAB — GLUCOSE, CAPILLARY: Glucose-Capillary: 118 mg/dL — ABNORMAL HIGH (ref 70–99)

## 2022-07-26 MED ORDER — POTASSIUM CHLORIDE CRYS ER 20 MEQ PO TBCR
40.0000 meq | EXTENDED_RELEASE_TABLET | Freq: Once | ORAL | Status: AC
  Filled 2022-07-26: qty 2

## 2022-07-26 MED ORDER — CARVEDILOL 3.125 MG PO TABS: 6.2500 mg | ORAL_TABLET | Freq: Two times a day (BID) | ORAL | Status: DC

## 2022-07-26 MED ORDER — MAGNESIUM SULFATE 2 GM/50ML IV SOLN
2.0000 g | Freq: Once | INTRAVENOUS | Status: AC
  Administered 2022-07-26: 2 g via INTRAVENOUS
  Filled 2022-07-26: qty 50

## 2022-07-26 MED ORDER — ATORVASTATIN CALCIUM 40 MG PO TABS: 40.0000 mg | ORAL_TABLET | Freq: Every evening | ORAL | 1 refills | Status: DC

## 2022-07-26 MED ORDER — POTASSIUM CHLORIDE CRYS ER 20 MEQ PO TBCR: 20.0000 meq | EXTENDED_RELEASE_TABLET | Freq: Every day | ORAL | 2 refills | Status: DC

## 2022-07-26 MED ORDER — FUROSEMIDE 10 MG/ML IJ SOLN
INTRAMUSCULAR | Status: AC
  Filled 2022-07-26: qty 6

## 2022-07-26 MED ORDER — FUROSEMIDE 40 MG PO TABS: 40.0000 mg | ORAL_TABLET | Freq: Every day | ORAL | 2 refills | Status: DC

## 2022-07-26 MED ORDER — POTASSIUM CHLORIDE CRYS ER 20 MEQ PO TBCR
20.0000 meq | EXTENDED_RELEASE_TABLET | Freq: Once | ORAL | Status: AC
  Administered 2022-07-26: 20 meq via ORAL
  Filled 2022-07-26: qty 1

## 2022-07-26 MED ORDER — FUROSEMIDE 10 MG/ML IJ SOLN: 80.0000 mg | Freq: Two times a day (BID) | INTRAMUSCULAR | Status: DC

## 2022-07-26 MED ORDER — CARVEDILOL 6.25 MG PO TABS: 6.2500 mg | ORAL_TABLET | Freq: Two times a day (BID) | ORAL | 2 refills | Status: DC

## 2022-07-26 MED ORDER — POTASSIUM CHLORIDE 20 MEQ PO PACK
40.0000 meq | PACK | Freq: Once | ORAL | Status: AC
  Administered 2022-07-26: 40 meq via ORAL
  Filled 2022-07-26: qty 2

## 2022-07-26 MED ORDER — POTASSIUM CHLORIDE CRYS ER 20 MEQ PO TBCR
40.0000 meq | EXTENDED_RELEASE_TABLET | Freq: Two times a day (BID) | ORAL | Status: DC
  Administered 2022-07-26: 40 meq via ORAL
  Filled 2022-07-26: qty 2

## 2022-07-26 NOTE — Progress Notes (Signed)
Contacted doctor regarding potassium being 2.6 and the scheduled dose of lasix. Dr. Saddie Benders said to proceed with the administration of lasix. Oral potassium given as ordered. Spoke with Dr. Pam Drown about reordering tele and he stated pt doesn't need tele.

## 2022-07-26 NOTE — Discharge Summary (Signed)
Physician Discharge Summary [facility]    Patient: Michael [name]                   Admit date: 07/24/2022   DOB: 1955-11-08             Discharge date:07/26/2022/11:00 AM [id number]                          PCP: Benita Stabile, MD  Disposition:   HOME   Recommendations for Outpatient Follow-up:   Follow-up with cardiologist in 1-2 weeks BMP in 1 week results to PCP/cardiologist Strict cardiac diet salt Limited Daily weight If any symptoms such as shortness of breath, leg swelling, or gaining weight 3-5 pounds in 24 hours contact your PCP or cardiologist, take an extra dose of Lasix to 40 mg  Discharge Condition: Stable   Code Status:   Code Status: Full Code  Diet recommendation: Cardiac diet   Discharge Diagnoses:    Principal Problem:   Acute on chronic systolic CHF (congestive heart failure) (HCC) Active Problems:   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   Hypokalemia   Essential hypertension   Anemia   Acute hypoxemic respiratory failure (HCC)   COPD with acute exacerbation (HCC)   Acute renal failure superimposed on chronic kidney disease (HCC)   Chronic HFrEF (heart failure with reduced ejection fraction) (HCC)   Carotid stenosis, asymptomatic, right   CAD (coronary artery disease)   Chronic kidney disease, stage 3b (HCC)   History of Present Illness/ Hospital Course [name] Summary:    Michael [name] Is a 67 year old male with a history of HFrEF, (EJF35-40% ): CKD stage III, coronary disease, COPD, tobacco abuse, basal cell carcinoma status post partial rhinectomy and maxillectomy 06/06/2017 at [facility], advanced colonic tubular adenoma, right carotid stenosis, hypertension, presenting with shortness of breath. Apparently patient was diagnosed with pneumonia last week, has been taking his antibiotics, but he is progressively getting more shortness of breath past 3-4 days.  Reporting shortness of breath at rest, and with minimal exertion gets  worse.   Multiple hospitalization in past 2 months for shortness of breath, COPD exacerbation Hospitalization, recent discharge 05/06/2022, also admission and discharged 04/29/2018 for COPD exacerbation, shortness of breath, and syncope.  Did not have pedal edema on admission but did have pulmonary edema and supplemental O2 requirement. Was appropriately diuresed during admission with weight decreasing from 69kg to 64kg, and transitioned appropriately to room air by 6/24. Received consult input from cardiology during his admission. Needed potassium repletion on day of discharge.    Assessment and Plan:  Cardiovascular and Mediastinum CAD (coronary artery disease) Assessment & Plan Holding statins due to elevated LFTs, per Cardiology's recs. Recommend recheck as outpatient before restarting statin. -May resume Lipitor at lower dose decreased from 80 to 40 mg daily-starting July 1 - Continue 75mg  Plavix.   Essential hypertension Assessment & Plan BP better controlled after restarting home Coreg 3.125mg , 116/91-153/84. - Continue Coreg 3.125mg . >>  Increase to 6.25 mg twice daily -Discontinued Norvasc  * Acute on chronic systolic CHF (congestive heart failure) (HCC) Assessment & Plan Most recent Echo from March 2024 with EF at 35-40%. Was net negative yesterday. Remains on room air and breathing comfortably. Will need outpt follow up with his cardiologist.  - Repleting Potassium for >4, Mg>2. -Changes diuretic to 40 mg of Lasix daily (with 20 mg of KCl daily) -Increased Coreg to 6.25 mg p.o. twice daily -  Continue Imdur 60mg  daily - Discussed considering low dose Spironolactone as outpatient Not a candidate for Entersto ACEi/ARBs/ SGLT2 due to low GFR acute on chronic CKD okay  Genitourinary Acute renal failure superimposed on chronic kidney disease (HCC) Assessment & Plan Cr has improved from 2.33 to 1.72 during admission, at his baseline of 1.7-2.0. - Continue avoiding NSAIDs  and other nephrotoxic medications    Discharge Instructions:   Discharge Instructions     (HEART FAILURE PATIENTS) Call MD:  Anytime you have any of the following symptoms: 1) 3 pound weight gain in 24 hours or 5 pounds in 1 week 2) shortness of breath, with or without a dry hacking cough 3) swelling in the hands, feet or stomach 4) if you have to sleep on extra pillows at night in order to breathe.   Complete by: As directed    AMB referral to CHF clinic   Complete by: As directed    Reason for referral: Systolic HF   Activity as tolerated - No restrictions   Complete by: As directed    Amb Referral to Palliative Care   Complete by: As directed    Call MD for:  difficulty breathing, headache or visual disturbances   Complete by: As directed    Call MD for:  temperature >100.4   Complete by: As directed    Diet - low sodium heart healthy   Complete by: As directed    Discharge instructions   Complete by: As directed    Follow-up with PCP in 1-2 weeks Follow-up with cardiologist in 1-2 weeks -BMP in 1 week results to PCP/cardiologist Strict cardiac diet salt Limited-Daily weight If any symptoms such as shortness of breath, leg swelling, or gaining weight 3-5 pounds in 24 hours contact your PCP or cardiologist, take an extra dose of Lasix to 40 mg   Increase activity slowly   Complete by: As directed       Allergies as of 07/26/2022   No Known Allergies      Medication List     STOP taking these medications    amLODipine 5 MG tablet Commonly known as: NORVASC       TAKE these medications    acetaminophen 500 MG tablet Commonly known as: TYLENOL Take 1,000 mg by mouth daily as needed for fever, headache or moderate pain.   albuterol 108 (90 Base) MCG/ACT inhaler Commonly known as: VENTOLIN HFA Inhale 2 puffs into the lungs every 6 (six) hours as needed for wheezing or shortness of breath.   atorvastatin 40 MG tablet Commonly known as: LIPITOR Take 1 tablet  (40 mg total) by mouth every evening. Start taking on: August 01, 2022 What changed:  medication strength how much to take These instructions start on August 01, 2022. If you are unsure what to do until then, ask your doctor or other care provider.   Breztri Aerosphere 160-9-4.8 MCG/ACT Aero Generic drug: Budeson-Glycopyrrol-Formoterol Inhale 2 puffs into the lungs 2 (two) times daily.   carvedilol 6.25 MG tablet Commonly known as: Coreg Take 1 tablet (6.25 mg total) by mouth 2 (two) times daily with a meal. What changed:  medication strength how much to take   clopidogrel 75 MG tablet Commonly known as: PLAVIX Take 1 tablet (75 mg total) by mouth daily.   furosemide 40 MG tablet Commonly known as: Lasix Take 1 tablet (40 mg total) by mouth daily. What changed:  medication strength how much to take additional instructions   isosorbide mononitrate 60  MG 24 hr tablet Commonly known as: IMDUR Take 1 tablet (60 mg total) by mouth every evening.   potassium chloride SA 20 MEQ tablet Commonly known as: KLOR-CON M Take 1 tablet (20 mEq total) by mouth daily.         Follow-up Information     [facility] Follow up.   Why: PT will call to schedule your first home visit.               No Known Allergies   Quit smoking:  It is highly recommended that all people especially deals with diabetes to quit smoking or stay away from smoking, avoid secondhand smoking.   Vaccines:  Also highly recommended update your vaccines requirement, including SARS-CoV-2 , yearly  flu vaccine and pneumonia vaccine at least every 5 years.      Exercise: If you are able: 30 -60 minutes a day, 4 days a week, or 150 minutes a week.  The longer the better.  Combine stretch, strength, and aerobic activities.  If you were told in the past that you have high risk for cardiovascular diseases, you may seek evaluation by your heart doctor prior to initiating moderate to intense exercise  programs.    One other important lifestyle recommendation is to ensure adequate sleep - at least 6-7 hours of uninterrupted sleep at night.  Procedures /Studies:   DG Chest Port 1 View  Result Date: 07/24/2022 CLINICAL DATA:  Shortness of breath. EXAM: PORTABLE CHEST 1 VIEW COMPARISON:  07/19/2022 FINDINGS: The cardio pericardial silhouette is enlarged. Interstitial markings are diffusely coarsened with chronic features. No overt airspace pulmonary edema or focal consolidation. Hazy opacity at the left base may be atelectasis and/or layering pleural fluid. Telemetry leads overlie the chest. IMPRESSION: 1. Chronic interstitial coarsening without overt airspace pulmonary edema. 2. Possible layering left pleural effusion. Electronically Signed   By: Kennith Center M.D.   On: 07/24/2022 11:59   DG Chest 2 View  Result Date: 07/21/2022 CLINICAL DATA:  COPD, shortness of breath EXAM: CHEST - 2 VIEW COMPARISON:  05/07/2022 FINDINGS: Transverse diameter of heart is increased. Central pulmonary vessels are more prominent. Patchy densities are noted in right lower lung field in the PA view which could not be localized in the lateral view. There is slight prominence of interstitial markings in the parahilar regions and lower lung fields. There is no pleural effusion or pneumothorax. IMPRESSION: Cardiomegaly. Central pulmonary vessels are more prominent suggesting CHF. Small patchy densities are seen in the lateral aspect of right lower lung field seen only in the PA view. This may suggest atelectasis/pneumonia. There is no pleural effusion or pneumothorax. Electronically Signed   By: Ernie Avena M.D.   On: 07/21/2022 10:07   US Abdomen Limited RUQ (LIVER/GB)  Result Date: 07/21/2022 CLINICAL DATA:  Abnormal liver function tests EXAM: ULTRASOUND ABDOMEN LIMITED RIGHT UPPER QUADRANT COMPARISON:  CT done on 01/25/2022 FINDINGS: Gallbladder: There is large echogenic structure measuring 3.2 cm with acoustic  shadowing in gallbladder fossa suggesting large gallbladder stone. Technologist did not observe any tenderness over the gallbladder fossa. Gallbladder is not distended. Common bile duct: Diameter: 4 mm Liver: There is increased echogenicity in liver. No focal abnormalities are seen in visualized portions of liver. Portal vein is patent on color Doppler imaging with normal direction of blood flow towards the liver. Other: None. IMPRESSION: Large gallbladder stone is noted filling the gallbladder. There are no imaging signs of acute cholecystitis. There is no dilation of bile  ducts. There is increased echogenicity in liver suggesting possible fatty infiltration. Electronically Signed   By: Ernie Avena M.D.   On: 07/21/2022 10:03    Subjective:   Patient was seen and examined 07/26/2022, 11:00 AM Patient stable today. No acute distress.  No issues overnight Stable for discharge.  Discharge Exam:    Vitals:   07/25/22 1711 07/25/22 2107 07/26/22 0030 07/26/22 0438  BP: (!) 148/93 119/70 (!) 141/91 (!) 144/99  Pulse: 75 61 79 67  Resp:  20 20 20   Temp: 98 F (36.7 C) 98.4 F (36.9 C) 98 F (36.7 C) 98.1 F (36.7 C)  TempSrc: Oral Oral Oral Oral  SpO2: 98% 96% 96%   Weight:    64.6 kg  Height:        General: Pt lying comfortably in bed & appears in no obvious distress. Cardiovascular: S1 & S2 heard, RRR, S1/S2 +. No murmurs, rubs, gallops or clicks. No JVD and only trace pedal edema. Respiratory: Clear to auscultation without wheezing, rhonchi or crackles. No increased work of breathing. Abdominal:  Non-distended, non-tender & soft. No organomegaly or masses appreciated. Normal bowel sounds heard. CNS: Alert and oriented. No focal deficits. Extremities: no edema, no cyanosis      The results of significant diagnostics from this hospitalization (including imaging, microbiology, ancillary and laboratory) are listed below for reference.      Microbiology:   Recent Results  (from the past 240 hour(s))  Resp panel by RT-PCR (RSV, Flu A&B, Covid) Anterior Nasal Swab     Status: None   Collection Time: 07/24/22 11:47 AM   Specimen: Anterior Nasal Swab  Result Value Ref Range Status   SARS Coronavirus 2 by RT PCR NEGATIVE NEGATIVE Final    Comment: (NOTE) SARS-CoV-2 target nucleic acids are NOT DETECTED.  The SARS-CoV-2 RNA is generally detectable in upper respiratory specimens during the acute phase of infection. The lowest concentration of SARS-CoV-2 viral copies this assay can detect is 138 copies/mL. A negative result does not preclude SARS-Cov-2 infection and should not be used as the sole basis for treatment or other patient management decisions. A negative result may occur with  improper specimen collection/handling, submission of specimen other than nasopharyngeal swab, presence of viral mutation(s) within the areas targeted by this assay, and inadequate number of viral copies(<138 copies/mL). A negative result must be combined with clinical observations, patient history, and epidemiological information. The expected result is Negative.  Fact Sheet for Patients:  BloggerCourse.com  Fact Sheet for Healthcare Providers:  SeriousBroker.it  This test is no t yet approved or cleared by the [place] FDA and  has been authorized for detection and/or diagnosis of SARS-CoV-2 by FDA under an Emergency Use Authorization (EUA). This EUA will remain  in effect (meaning this test can be used) for the duration of the COVID-19 declaration under Section 564(b)(1) of the Act, 21 U.S.C.section 360bbb-3(b)(1), unless the authorization is terminated  or revoked sooner.       Influenza A by PCR NEGATIVE NEGATIVE Final   Influenza B by PCR NEGATIVE NEGATIVE Final    Comment: (NOTE) The Xpert Xpress SARS-CoV-2/FLU/RSV plus assay is intended as an aid in the diagnosis of influenza from Nasopharyngeal swab  specimens and should not be used as a sole basis for treatment. Nasal washings and aspirates are unacceptable for Xpert Xpress SARS-CoV-2/FLU/RSV testing.  Fact Sheet for Patients: BloggerCourse.com  Fact Sheet for Healthcare Providers: SeriousBroker.it  This test is not yet approved or cleared by the  [place] Futures trader and has been authorized for detection and/or diagnosis of SARS-CoV-2 by FDA under an TEFL teacher (EUA). This EUA will remain in effect (meaning this test can be used) for the duration of the COVID-19 declaration under Section 564(b)(1) of the Act, 21 U.S.C. section 360bbb-3(b)(1), unless the authorization is terminated or revoked.     Resp Syncytial Virus by PCR NEGATIVE NEGATIVE Final    Comment: (NOTE) Fact Sheet for Patients: BloggerCourse.com  Fact Sheet for Healthcare Providers: SeriousBroker.it  This test is not yet approved or cleared by the [place] FDA and has been authorized for detection and/or diagnosis of SARS-CoV-2 by FDA under an Emergency Use Authorization (EUA). This EUA will remain in effect (meaning this test can be used) for the duration of the COVID-19 declaration under Section 564(b)(1) of the Act, 21 U.S.C. section 360bbb-3(b)(1), unless the authorization is terminated or revoked.  Performed at [facility], [address], [place], [place] [address]   MRSA Next Gen by PCR, Nasal     Status: None   Collection Time: 07/24/22  7:30 PM   Specimen: Nasal Mucosa; Nasal Swab  Result Value Ref Range Status   MRSA by PCR Next Gen NOT DETECTED NOT DETECTED Final    Comment: (NOTE) The GeneXpert MRSA Assay (FDA approved for NASAL specimens only), is one component of a comprehensive MRSA colonization surveillance program. It is not intended to diagnose MRSA infection nor to guide or monitor treatment for MRSA infections. Test  performance is not FDA approved in patients less than 91 years old. Performed at [facility], [address] [place], [place] [address]      Labs:   CBC: Recent Labs  Lab 07/24/22 1130 07/25/22 0337 07/26/22 0442  WBC 12.5* 13.6* 9.7  NEUTROABS 9.8*  --   --   HGB 13.1 10.9* 11.4*  HCT 41.7 34.7* 36.3*  MCV 91.6 91.3 91.2  PLT 270 257 276   Basic Metabolic Panel: Recent Labs  Lab 07/24/22 1130 07/24/22 1210 07/25/22 0337 07/26/22 0442  NA  --  138 134* 139  K  --  4.2 2.8* 2.6*  CL  --  106 104 101  CO2  --  21* 21* 27  GLUCOSE  --  148* 133* 118*  BUN  --  44* 42* 32*  CREATININE  --  2.33* 1.96* 1.72*  CALCIUM  --  8.2* 7.4* 7.7*  MG 2.0  --   --   --   PHOS 3.5  --   --   --    Liver Function Tests: Recent Labs  Lab 07/24/22 1210 07/25/22 0337 07/26/22 0442  AST 56* 35 38  ALT 216* 136* 117*  ALKPHOS 105 86 97  BILITOT 2.8* 2.0* 1.6*  PROT 6.3* 5.5* 6.1*  ALBUMIN 3.0* 2.6* 2.7*   BNP (last 3 results) Recent Labs    07/24/22 1130 07/25/22 0337 07/26/22 0442  BNP >4,500.0* >4,500.0* 3,047.0*   Cardiac Enzymes: No results for input(s): "CKTOTAL", "CKMB", "CKMBINDEX", "TROPONINI" in the last 168 hours. CBG: Recent Labs  Lab 07/25/22 0854 07/26/22 0711  GLUCAP 135* 118*   Hgb A1c No results for input(s): "HGBA1C" in the last 72 hours. Lipid Profile Recent Labs    07/24/22 1130  CHOL 62  HDL 32*  LDLCALC 18  TRIG 60  CHOLHDL 1.9    Urinalysis    Component Value Date/Time   COLORURINE COLORLESS (A) 05/05/2022 0830   APPEARANCEUR CLEAR 05/05/2022 0830   APPEARANCEUR Cloudy (  A) 06/16/2021 0936   LABSPEC 1.005 05/05/2022 0830   LABSPEC 1.018 04/23/2011 1044   PHURINE 5.0 05/05/2022 0830   GLUCOSEU NEGATIVE 05/05/2022 0830   GLUCOSEU Negative 04/23/2011 1044   HGBUR NEGATIVE 05/05/2022 0830   BILIRUBINUR NEGATIVE 05/05/2022 0830   BILIRUBINUR Negative 06/16/2021 0936   BILIRUBINUR Negative 04/23/2011 1044   KETONESUR NEGATIVE  05/05/2022 0830   PROTEINUR NEGATIVE 05/05/2022 0830   NITRITE NEGATIVE 05/05/2022 0830   LEUKOCYTESUR SMALL (A) 05/05/2022 0830   LEUKOCYTESUR Trace 04/23/2011 1044        Time coordinating discharge: 45 minutes  Marcelline Mates, MS4 Working with Dr. Nevin Bloodgood   The patient was seen and examined independently, discussed with cardiologist dependently, reviewed all labs medical records discharge med rec and plan    SIGNED: Kendell Bane, MD, [facility]. FAAFP [facility],  Pager (please use [email or web address] to page/text)  Please use Epic Secure Chat for non-urgent communication (7AM-7PM) If 7PM-7AM, please contact night-coverage [email or web address],  07/26/2022, 11:03 AM

## 2022-07-26 NOTE — Care Management Important Message (Signed)
Important Message  Patient Details  Name: Michael [name] MRN: [id number] Date of Birth: [birth year]   Medicare Important Message Given:  N/A - LOS <3 / Initial given by admissions     Corey Harold 07/26/2022, 11:58 AM

## 2022-07-26 NOTE — Progress Notes (Signed)
Hospitalist notified of K+ 2.8 dated 6/24. No new orders at this time. Call bell in reach. Bed in lowest position.

## 2022-07-26 NOTE — Progress Notes (Signed)
Palliative: Mr. Alkhatib is sitting up in bed with his lunch tray in front of him.  He appears chronically ill and quite frail.  He is alert and oriented, able to make his needs known.  There is no family at bedside at this time.  We talk about his acute health concerns and the treatment plan.  We talked about anticipated discharge today.  We again talked about the benefits of "treat the treatable" hospice care.  Mr. Pautz continues to remain noncommittal.  I encouraged him to consider this extra support.   Face-to-face conference with bedside nursing staff and transition of care team related to patient condition, needs, goals of care, disposition.  Plan:  Home with HH.  Continue full scope/full code.  Encouraged to consider "treat the treatable" hospice care.   25 minutes  Lillia Carmel, NP Palliative Medicine Team  Team phone [phone] Greater than 50% of this time was spent counseling and coordinating care related to the above assessment and plan.

## 2022-07-26 NOTE — Discharge Instructions (Signed)
After discharge you should make appointments to see your Primary doctor and your Cardiologist. Since your liver function tests were elevated but coming down, have these rechecked by a doctor and have them advise you on when to resume your Lipitor.

## 2022-07-26 NOTE — TOC Transition Note (Addendum)
Transition of Care [facility]) - CM/SW Discharge Note   Patient Details  Name: Chrstopher Malenfant MRN: [id number] Date of Birth: 09-03-1955  [facility]) CM/SW Contact:  Leitha Bleak, RN Phone Number: 07/26/2022, 11:36 AM   Clinical Narrative:   Patient discharging home today with home health. Orders have been placed for PT/OT/RN/ SW. Maralyn Sago with [facility] undated.     Final next level of care: Home w Home Health Services Barriers to Discharge: Continued Medical Work up   Patient Goals and CMS Choice CMS Medicare.gov Compare Post Acute Care list provided to:: Patient   Discharge Placement       Patient and family notified of of transfer: 07/26/22  Discharge Plan and Services Additional resources added to the After Visit Summary for          [facility] Arranged: PT   Date [facility] Agency Contacted: 07/25/22 Time HH Agency Contacted: 1100 Representative spoke with at [facility]: Versie Starks  Social Determinants of Health (SDOH) Interventions SDOH Screenings   Food Insecurity: No Food Insecurity (07/24/2022)  Housing: Low Risk  (07/24/2022)  Recent Concern: Housing - Medium Risk (05/05/2022)  Transportation Needs: No Transportation Needs (07/24/2022)  Recent Concern: Transportation Needs - Unmet Transportation Needs (05/05/2022)  Utilities: Not At Risk (07/24/2022)  Alcohol Screen: Low Risk  (12/11/2019)  Depression (PHQ2-9): Low Risk  (12/11/2019)  Financial Resource Strain: Low Risk  (12/11/2019)  Physical Activity: Inactive (12/11/2019)  Social Connections: Socially Isolated (12/11/2019)  Stress: No Stress Concern Present (12/11/2019)  Tobacco Use: Medium Risk (07/25/2022)     Readmission Risk Interventions    07/25/2022    1:09 PM 04/26/2022   11:46 AM 10/26/2021    2:57 PM  Readmission Risk Prevention Plan  Transportation Screening Complete Complete Complete  PCP or Specialist Appt within 3-5 Days   Complete  HRI or Home Care Consult  Complete Complete  Social Work  Consult for Recovery Care Planning/Counseling  Complete Complete  Palliative Care Screening  Not Applicable Not Applicable  Medication Review Oceanographer) Complete Complete Complete  PCP or Specialist appointment within 3-5 days of discharge Not Complete    HRI or Home Care Consult Complete    SW Recovery Care/Counseling Consult Complete    Palliative Care Screening Complete    Skilled Nursing Facility Not Applicable

## 2022-07-26 NOTE — Progress Notes (Addendum)
Date and time results received: 07/26/22 0981   Test: K+  Critical Value: 2.1  Name of Provider Notified: Asia Zierle- Gosh  Orders Received? Or Actions Taken?:  Dr Dorthula PerfectDelrae Rend made aware. PO Potassium ordered.   [place] RN

## 2022-07-26 NOTE — Progress Notes (Addendum)
Date and time results received: 07/26/22  Test: Potassium Critical Value: 2.6  Name of Provider Notified: Asia Zierle-Gosh  Orders Received? Or Actions Taken?: See MAR

## 2022-07-26 NOTE — Consult Note (Addendum)
[employer] Customer service manager [facility]) Accountable Care Organization (ACO) [facility] Liaison Note  07/26/2022  Michael [name] 05-Feb-1955 [id number]  Location: [facility] Liaison screened the patient remotely at [facility].  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 The patient was screened for  readmission hospitalization with noted extreme risk score for unplanned readmission risk with 4 IP/8 ED in 6 months.  The patient was assessed for potential Care Management service needs for post hospital transition for care coordination. Review of patient's electronic medical record reveals patient was admitted with Acute CHF. Liaison spoke with pt today and educated on community base benefits related to care coordination services with a RN reaching out to assist with hospital prevention interventions related to his ongoing medical condition CHF/COPD. Pt very receptive to the post follow up call. Verified pt will be discharged today home with his supportive friend [place]. [facility] has been arranged as hospital liaison collaborated with the involved TOC member and requested a visiting RN via [employer] agency if possible for educations on his medical issues to prevention ongoing ED visits and admissions due to his extreme high risk status.  Addendum: TOC team member confirmed [facility] will accommodate pt for PT/OT/RN/SW.   Care Management/Population Health does not replace or interfere with any arrangements made by the Inpatient Transition of Care team.   For questions contact:   Elliot Cousin, RN, [facility] Liaison [place]   Population Health Office Hours MTWF  8:00 am-6:00 pm Off on Thursday [phone] mobile [phone] [Office toll free line] Office Hours are M-F 8:30 - 5 pm 24 hour nurse advise line [phone] Concierge  [email or web address]

## 2022-07-26 NOTE — Progress Notes (Addendum)
Patient Name: Michael [name] Date of Encounter: 07/26/2022 [facility] Cardiologist: Nona Dell, MD    Interval Summary  .    Feels better, no complaints  Vital Signs .    Vitals:   07/25/22 1711 07/25/22 2107 07/26/22 0030 07/26/22 0438  BP: (!) 148/93 119/70 (!) 141/91 (!) 144/99  Pulse: 75 61 79 67  Resp:  20 20 20   Temp: 98 F (36.7 C) 98.4 F (36.9 C) 98 F (36.7 C) 98.1 F (36.7 C)  TempSrc: Oral Oral Oral Oral  SpO2: 98% 96% 96%   Weight:    64.6 kg  Height:        Intake/Output Summary (Last 24 hours) at 07/26/2022 0908 Last data filed at 07/25/2022 2330 Gross per 24 hour  Intake 480 ml  Output 450 ml  Net 30 ml      07/26/2022    4:38 AM 07/25/2022    8:00 AM 07/25/2022    4:30 AM  Last 3 Weights  Weight (lbs) 142 lb 8 oz 153 lb 7 oz 153 lb 7 oz  Weight (kg) 64.638 kg 69.6 kg 69.6 kg      Telemetry/ECG    Not on telemetry today - Personally Reviewed  Physical Exam .   GEN: No acute distress.   Neck: No JVD Cardiac:  RRR, no murmurs, rubs, or gallops.  Respiratory: Clear to auscultation bilaterally. GI: Soft, nontender, non-distended  MS: No edema  Assessment & Plan .    HFrEF -  known cardiomyopathy with an EF at 35 to 40% by echo 04/2022. Medical management has been limited secondary to orthostatic hypotension, CKD and medication noncompliance although he says he's taking his meds -BNP>4500 on Lasix 60 mg IV BID after 80 mg IV in ED, much improved and BNP 3047 today. -Continue IV lasix today and replace K - negative 1 L    CAD - cardiac catheterization in 10/2021 showing mild to moderate obstructive CAD along the mid-RCA and acute Mrg with medical management recommended. - Continue current medical therapy with Plavix 75 mg daily, Imdur 60 mg daily ( Atorvastatin 80 mg daily on hold with elevated LFT's).  - Could try to increase Coreg 6.25 mg twice daily.   HTN - His BP elevated on admission and last OV but history of orthostatic  hypotension during prior admissions. BP up today. Consider increase coreg 6.25 mg bid   HLD - His LDL was at 64 in 07/2021. He has recently restarted Atorvastatin 80 mg daily and now on hold with elevated LFT's   Stage 3 CKD - Baseline creatinine appears to be between 1.7 - 1.9. Stable at 1.72. K 2.6 this am will replace   Elevated LFT's- large gallstone on [place] but no acute cholelithiasis-lipitor on hold       For questions or updates, please contact [facility] Please consult [email or web address] for contact info under        Signed, Michael Reedy, Michael [name]     No acute events overnight, doing great. SOB improving. 750 mL urine output in the last 24 hours.  Telemetry reviewed, NSR and no alarms.  Physical examination is remarkable for patient not in acute respite distress, HEENT normal (disfigured nose), S1-S2 normal, no murmur, clear lungs, abdomen NT and ND, no edema in lower extremities, AAOx3. Serum creatinine improving, will continue IV diuresis with 60 mg twice daily and replete K/mag accordingly. Keep K>4 and <5, Mg>2 and <3.  Blood pressures are mildly elevated, will increase  carvedilol from 3.125 mg to 6.25 mg twice daily. Continue medical therapy for mild to moderate CAD, Plavix 70 mg once daily, Imdur 60 mg once daily, statin on hold due to elevated LFTs.  I have spent a total of 30 minutes with patient reviewing chart , telemetry, EKGs, labs and examining patient as well as establishing an assessment and plan that was discussed with the patient.  > 50% of time was spent in direct patient care.    Michael Yuhas Verne Spurr, MD [place]  [facility]  10:45 AM

## 2022-07-27 ENCOUNTER — Telehealth: Payer: Self-pay | Admitting: *Deleted

## 2022-07-27 NOTE — Progress Notes (Signed)
  Care Coordination   Note   07/27/2022 Name: Michael [name] MRN: [id number] DOB: 09/18/1955  Michael [name] is a 67 y.o. year old male who sees [name], [name], MD for primary care. I reached out to [name] by phone today to offer care coordination services.  [name] was given information about Care Coordination services today including:   The Care Coordination services include support from the care team which includes your Nurse Coordinator, Clinical Social Worker, or Pharmacist.  The Care Coordination team is here to help remove barriers to the health concerns and goals most important to you. Care Coordination services are voluntary, and the patient may decline or stop services at any time by request to their care team member.   Care Coordination Consent Status: Patient agreed to services and verbal consent obtained.   Follow up plan:  Telephone appointment with care coordination team member scheduled for:  08/02/22  Encounter Outcome:  Pt. Scheduled  [facility] Coordination Care Guide  Direct Dial: [phone]

## 2022-07-27 NOTE — Progress Notes (Signed)
  Care Coordination  Outreach Note  07/27/2022 Name: Michael [name] MRN: [id number] DOB: Mar 07, 1955   Care Coordination Outreach Attempts: An unsuccessful telephone outreach was attempted today to offer the patient information about available care coordination services.  Follow Up Plan:  Additional outreach attempts will be made to offer the patient care coordination information and services.   Encounter Outcome:  No Answer  Christie Nottingham  Care Coordination Care Guide  Direct Dial: [phone]

## 2022-07-28 ENCOUNTER — Emergency Department (HOSPITAL_COMMUNITY): Payer: 59

## 2022-07-28 ENCOUNTER — Encounter (HOSPITAL_COMMUNITY): Payer: Self-pay | Admitting: Emergency Medicine

## 2022-07-28 ENCOUNTER — Other Ambulatory Visit: Payer: Self-pay

## 2022-07-28 ENCOUNTER — Inpatient Hospital Stay (HOSPITAL_COMMUNITY)
Admission: EM | Admit: 2022-07-28 | Discharge: 2022-07-31 | DRG: 291 | Disposition: A | Discharge status: Home / Self Care | Payer: 59 | Attending: Internal Medicine | Admitting: Internal Medicine

## 2022-07-28 ENCOUNTER — Ambulatory Visit: Payer: 59 | Admitting: General Surgery

## 2022-07-28 ENCOUNTER — Ambulatory Visit: Payer: 59 | Attending: Nurse Practitioner | Admitting: Nurse Practitioner

## 2022-07-28 ENCOUNTER — Encounter: Payer: Self-pay | Admitting: Nurse Practitioner

## 2022-07-28 VITALS — BP 120/70 | HR 73 | Ht 72.0 in | Wt 148.0 lb

## 2022-07-28 DIAGNOSIS — Z79899 Other long term (current) drug therapy: Secondary | ICD-10-CM

## 2022-07-28 DIAGNOSIS — I5030 Unspecified diastolic (congestive) heart failure: Secondary | ICD-10-CM

## 2022-07-28 DIAGNOSIS — Z5900 Homelessness unspecified: Secondary | ICD-10-CM | POA: Diagnosis not present

## 2022-07-28 DIAGNOSIS — Z7951 Long term (current) use of inhaled steroids: Secondary | ICD-10-CM | POA: Diagnosis not present

## 2022-07-28 DIAGNOSIS — I10 Essential (primary) hypertension: Secondary | ICD-10-CM | POA: Diagnosis not present

## 2022-07-28 DIAGNOSIS — I5023 Acute on chronic systolic (congestive) heart failure: Secondary | ICD-10-CM | POA: Diagnosis present

## 2022-07-28 DIAGNOSIS — N1832 Chronic kidney disease, stage 3b: Secondary | ICD-10-CM | POA: Diagnosis present

## 2022-07-28 DIAGNOSIS — Z85828 Personal history of other malignant neoplasm of skin: Secondary | ICD-10-CM

## 2022-07-28 DIAGNOSIS — J449 Chronic obstructive pulmonary disease, unspecified: Secondary | ICD-10-CM

## 2022-07-28 DIAGNOSIS — Z91199 Patient's noncompliance with other medical treatment and regimen due to unspecified reason: Secondary | ICD-10-CM | POA: Diagnosis not present

## 2022-07-28 DIAGNOSIS — J90 Pleural effusion, not elsewhere classified: Secondary | ICD-10-CM | POA: Diagnosis not present

## 2022-07-28 DIAGNOSIS — I700 Atherosclerosis of aorta: Secondary | ICD-10-CM | POA: Diagnosis present

## 2022-07-28 DIAGNOSIS — I2510 Atherosclerotic heart disease of native coronary artery without angina pectoris: Secondary | ICD-10-CM | POA: Diagnosis present

## 2022-07-28 DIAGNOSIS — I6521 Occlusion and stenosis of right carotid artery: Secondary | ICD-10-CM | POA: Diagnosis not present

## 2022-07-28 DIAGNOSIS — I509 Heart failure, unspecified: Secondary | ICD-10-CM | POA: Diagnosis not present

## 2022-07-28 DIAGNOSIS — I428 Other cardiomyopathies: Secondary | ICD-10-CM | POA: Diagnosis present

## 2022-07-28 DIAGNOSIS — R0602 Shortness of breath: Secondary | ICD-10-CM | POA: Diagnosis not present

## 2022-07-28 DIAGNOSIS — J189 Pneumonia, unspecified organism: Secondary | ICD-10-CM | POA: Diagnosis not present

## 2022-07-28 DIAGNOSIS — D509 Iron deficiency anemia, unspecified: Secondary | ICD-10-CM | POA: Diagnosis present

## 2022-07-28 DIAGNOSIS — Z9049 Acquired absence of other specified parts of digestive tract: Secondary | ICD-10-CM | POA: Diagnosis not present

## 2022-07-28 DIAGNOSIS — I5020 Unspecified systolic (congestive) heart failure: Secondary | ICD-10-CM | POA: Diagnosis not present

## 2022-07-28 DIAGNOSIS — R918 Other nonspecific abnormal finding of lung field: Secondary | ICD-10-CM | POA: Diagnosis not present

## 2022-07-28 DIAGNOSIS — I130 Hypertensive heart and chronic kidney disease with heart failure and stage 1 through stage 4 chronic kidney disease, or unspecified chronic kidney disease: Secondary | ICD-10-CM | POA: Diagnosis not present

## 2022-07-28 DIAGNOSIS — K7689 Other specified diseases of liver: Secondary | ICD-10-CM | POA: Diagnosis not present

## 2022-07-28 DIAGNOSIS — I2489 Other forms of acute ischemic heart disease: Secondary | ICD-10-CM | POA: Diagnosis not present

## 2022-07-28 DIAGNOSIS — Z823 Family history of stroke: Secondary | ICD-10-CM | POA: Diagnosis not present

## 2022-07-28 DIAGNOSIS — I161 Hypertensive emergency: Secondary | ICD-10-CM | POA: Diagnosis present

## 2022-07-28 DIAGNOSIS — R891 Abnormal level of hormones in specimens from other organs, systems and tissues: Secondary | ICD-10-CM | POA: Diagnosis present

## 2022-07-28 DIAGNOSIS — I5043 Acute on chronic combined systolic (congestive) and diastolic (congestive) heart failure: Secondary | ICD-10-CM

## 2022-07-28 DIAGNOSIS — Z87891 Personal history of nicotine dependence: Secondary | ICD-10-CM | POA: Diagnosis not present

## 2022-07-28 DIAGNOSIS — E785 Hyperlipidemia, unspecified: Secondary | ICD-10-CM | POA: Diagnosis not present

## 2022-07-28 DIAGNOSIS — R7989 Other specified abnormal findings of blood chemistry: Secondary | ICD-10-CM

## 2022-07-28 DIAGNOSIS — N183 Chronic kidney disease, stage 3 unspecified: Secondary | ICD-10-CM | POA: Diagnosis present

## 2022-07-28 DIAGNOSIS — I517 Cardiomegaly: Secondary | ICD-10-CM | POA: Diagnosis not present

## 2022-07-28 DIAGNOSIS — I110 Hypertensive heart disease with heart failure: Secondary | ICD-10-CM | POA: Diagnosis not present

## 2022-07-28 DIAGNOSIS — Z7902 Long term (current) use of antithrombotics/antiplatelets: Secondary | ICD-10-CM

## 2022-07-28 DIAGNOSIS — I340 Nonrheumatic mitral (valve) insufficiency: Secondary | ICD-10-CM | POA: Diagnosis not present

## 2022-07-28 LAB — BRAIN NATRIURETIC PEPTIDE: B Natriuretic Peptide: >4500 pg/mL — ABNORMAL HIGH (ref 0.0–100.0)

## 2022-07-28 LAB — TROPONIN I (HIGH SENSITIVITY)
Troponin I (High Sensitivity): 89 ng/L — ABNORMAL HIGH (ref <18)
Troponin I (High Sensitivity): 93 ng/L — ABNORMAL HIGH (ref <18)

## 2022-07-28 LAB — CBC
HCT: 42.6 % (ref 39.0–52.0)
Hemoglobin: 13 g/dL (ref 13.0–17.0)
MCH: 28.6 pg (ref 26.0–34.0)
MCHC: 30.5 g/dL (ref 30.0–36.0)
MCV: 93.8 fL (ref 80.0–100.0)
Platelets: 349 10*3/uL (ref 150–400)
RBC: 4.54 MIL/uL (ref 4.22–5.81)
RDW: 14.9 % (ref 11.5–15.5)
WBC: 8.3 10*3/uL (ref 4.0–10.5)
nRBC: 0 % (ref 0.0–0.2)

## 2022-07-28 LAB — PROTIME-INR
INR: 1.1 (ref 0.8–1.2)
Prothrombin Time: 14.2 seconds (ref 11.4–15.2)

## 2022-07-28 LAB — BASIC METABOLIC PANEL
Anion gap: 9 (ref 5–15)
BUN: 27 mg/dL — ABNORMAL HIGH (ref 8–23)
CO2: 29 mmol/L (ref 22–32)
Calcium: 8.7 mg/dL — ABNORMAL LOW (ref 8.9–10.3)
Chloride: 101 mmol/L (ref 98–111)
Creatinine, Ser: 1.77 mg/dL — ABNORMAL HIGH (ref 0.61–1.24)
GFR, Estimated: 41 mL/min — ABNORMAL LOW (ref >60)
Glucose, Bld: 153 mg/dL — ABNORMAL HIGH (ref 70–99)
Potassium: 4 mmol/L (ref 3.5–5.1)
Sodium: 139 mmol/L (ref 135–145)

## 2022-07-28 MED ORDER — IOHEXOL 350 MG/ML SOLN
75.0000 mL | Freq: Once | INTRAVENOUS | Status: AC | PRN
  Administered 2022-07-28: 75 mL via INTRAVENOUS

## 2022-07-28 MED ORDER — SODIUM CHLORIDE 0.9 % IV SOLN
500.0000 mg | Freq: Once | INTRAVENOUS | Status: AC
  Administered 2022-07-28: 500 mg via INTRAVENOUS
  Filled 2022-07-28: qty 5

## 2022-07-28 MED ORDER — SODIUM CHLORIDE 0.9 % IV SOLN
2.0000 g | Freq: Once | INTRAVENOUS | Status: AC
  Administered 2022-07-28: 2 g via INTRAVENOUS
  Filled 2022-07-28: qty 20

## 2022-07-28 MED ORDER — FUROSEMIDE 10 MG/ML IJ SOLN
40.0000 mg | Freq: Once | INTRAMUSCULAR | Status: AC
  Administered 2022-07-28: 40 mg via INTRAVENOUS
  Filled 2022-07-28: qty 4

## 2022-07-28 NOTE — Progress Notes (Signed)
Cardiology Office Note:  .   Date:  07/28/2022  ID:  Michael [name], DOB 1955/09/14, MRN [id number] PCP: Benita Stabile, MD  [facility] Providers Cardiologist:  Nona Dell, MD    History of Present Illness: .   Michael [name] is a 67 y.o. male with PMH of HFrEF, CAD, carotid artery stenosis, s/p right TCAR in January 2023, IDA, hyperlipidemia, hypertension, CKD stage III, pulmonary nodules, and COPD, who presents today for scheduled follow-up.  History of multiple admissions over the past few months for CHF exacerbation and previous syncopal episode.  Recurrent presentations due to homelessness, was not taking medications regularly.  Admitted April 2024 for acute dyspnea in setting of acute CHF and COPD exacerbation.  Not discharged on diuretic therapy due to orthostasis.  Carvedilol discontinued.  Last seen by Randall An, PA-C on May 13, 2022.  Stated he was doing well.  Noted baseline DOE.  Denied any syncopal episodes.  After office visit, was diagnosed with pneumonia.   Admitted June 2024 with acute on chronic systolic CHF.  Medications adjusted.  Not felt to be candidate for Entresto/SGLT2 inhibitors/ACEi/ARB due to low GFR, acute on chronic CKD during hospitalization.  Recommended considering low-dose spironolactone as outpatient.  Cardiology recommended holding statins due to elevated LFTs.  Today he presents for office follow-up.  He tells me he feels "lousy," admits to progressive shortness of breath, noticing symptoms at rest.  Tells me he is compliant with his medications.  Tells me he believes he was discharged too soon from [facility]. Denies any chest pain, palpitations, syncope, presyncope, dizziness, orthopnea, PND, swelling or significant weight changes, acute bleeding, or claudication. Currently is living somewhere close by.   Studies Reviewed: .    Echocardiogram 04/2022: 1. Left ventricular ejection fraction, by estimation, is 35 to 40%. The  left  ventricle has moderately decreased function. The left ventricle  demonstrates global hypokinesis. Left ventricular diastolic parameters are  indeterminate.   2. Right ventricular systolic function is normal. The right ventricular  size is normal. There is normal pulmonary artery systolic pressure.   3. The mitral valve is normal in structure. No evidence of mitral valve  regurgitation. No evidence of mitral stenosis.   4. The aortic valve has an indeterminant number of cusps. There is mild  calcification of the aortic valve. There is mild thickening of the aortic  valve. Aortic valve regurgitation is not visualized. No aortic stenosis is  present.   5. The inferior vena cava is normal in size with greater than 50%  respiratory variability, suggesting right atrial pressure of 3 mmHg.  Right and left heart cath 10/2021:  Mid RCA lesion is 70% stenosed.   Acute Mrg lesion is 80% stenosed.   1.  Mild to moderate obstructive coronary artery disease that is outweighed by the patient's degree of LV dysfunction.  There is a significant bifurcation lesion of the right coronary artery and large RV marginal branch.  This is moderately calcified and relatively tortuous.  Given the patient's lack of chest pain, medical therapy should be pursued. 2.  Cardiac output of 6.3 L/min, cardiac index of 3.3 L/min/m, mean RA pressure of 16 mmHg, mean wedge pressure of 21 mmHg, and LVEDP of 21 mmHg.   Recommendation: Goal-directed medical therapy with augmentation of diuretics; consider dual antiplatelet therapy for 1 year for medical treatment of acute coronary syndrome.  Physical Exam:   VS:  BP 120/70   Pulse 73   Ht 6' (1.829 m)  Wt 148 lb (67.1 kg)   SpO2 95%   BMI 20.07 kg/m    Wt Readings from Last 3 Encounters:  07/28/22 148 lb (67.1 kg)  07/26/22 142 lb 8 oz (64.6 kg)  05/13/22 160 lb (72.6 kg)    GEN: Well nourished, well developed in acute distress (short of breath at rest). NECK: No JVD; No  carotid bruits CARDIAC: S1/S2, RRR, no murmurs, rubs, gallops RESPIRATORY:  Clear and diminished, to auscultation without rales, wheezing or rhonchi, tachypnea, mostly in tripod position during exam EXTREMITIES:  No edema; Facial deformity along center of face, otherwise normal  ASSESSMENT AND PLAN: .    1.  Shortness of breath 2.  HFrEF 3. COPD  Patient is a 67 year old male with past medical history as mentioned above.  He comes in today for scheduled follow-up.  Tells me he feels "lousy," notes progressive shortness of breath that is getting worse over time.  Denies any anginal symptoms.  Tells me he believes he was discharged too soon from hospital a few days ago.  PE as mentioned above.  VSS.  Discussed options regarding inpatient evaluation at [facility] ED versus outpatient management of what appears to be either CHF or COPD exacerbation.  Patient is adamant about presenting back to [facility] ED today, which I am in agreement with. I called and gave report to Dr. Deretha Emory (ED physician at [facility]) and he verbalized understanding of report.  Upon arrival to the ED, recommended following be done/obtained: Twelve-lead EKG and close monitoring of vital signs Lab work including: CMET, CBC, proBNP, magnesium, respiratory viral panel, and lactic acid 2 view chest x-ray Admit for further observation and consult cardiology if this is due to CHF exacerbation.  Recommend adjusting meds, may very well need IV diuresis pending pro BNP level.  Depending on kidney function and blood pressure level, may need to be started on low-dose spironolactone. Recommend consulting PT/OT, may benefit from discharge to SNF.  Patient is high risk readmission due to frequent hospital readmissions.  Please consult social worker and case Production designer, theatre/television/film.  Recommend setting patient up with heart failure nurse navigator at discharge. Discharge when in stable condition and shortness of breath has improved.   Dispo: Follow-up  with me or APP in 1-2 weeks post discharge.   Signed, Sharlene Dory, NP

## 2022-07-28 NOTE — ED Triage Notes (Signed)
Pt c/o continued SOB due to CHF exacerbation, just discharged this morning for same. Denies CP.

## 2022-07-28 NOTE — Patient Instructions (Addendum)
Medication Instructions:  Your physician recommends that you continue on your current medications as directed. Please refer to the Current Medication list given to you today.  Labwork: none  Testing/Procedures: none  Follow-Up: Your physician recommends that you schedule a follow-up appointment in: 2 weeks with Philis Nettle  Going back to [facility] ED  Any Other Special Instructions Will Be Listed Below (If Applicable).  If you need a refill on your cardiac medications before your next appointment, please call your pharmacy.

## 2022-07-28 NOTE — H&P (Signed)
History and Physical    Patient: Michael [name] BJY:[id number] DOB: Mar 03, 1955 DOA: 07/28/2022 DOS: the patient was seen and examined on 07/29/2022 PCP: Benita Stabile, MD  Patient coming from: Home  Chief Complaint:  Chief Complaint  Patient presents with   Shortness of Breath   HPI: Michael [name] is a 67 y.o. male with medical history significant of HFrEF, COPD, CAD, CKD stage IIIb, tobacco abuse who presents to the emergency due to persistent shortness of breath which worsens on exertion. Patient was admitted from 6/23 to 6/25 due to acute on chronic systolic CHF (LVEF from echo done in March 2024 was 35 to 40%).  He complained of having trouble in getting around the house since being discharged and thought that he was possibly discharged too early. He followed up with his cardiologist today who helped to get him transferred to ER due to his request.  ED Course:  In the emergency department, BP was 184/122, respiratory rate was 23 bpm, other vital signs were within normal range.  Workup in the ED showed normal CBC and BMP except blood glucose of 153, BUN/creatinine 27/1.77 (creatinine is within baseline range).  Troponin x 2 - 89 > 93, BNP >4,500. CT angiography chest with contrast showed no evidence of pulmonary embolism.  Moderate to marked severity lingular atelectasis and/or infiltrate or pleural fluid bilaterally. Patient was empirically treated with IV ceftriaxone and azithromycin due to suspicion for CAP.  IV Lasix 40 mg x was given.  Hospitalist was asked to admit patient for further evaluation and management.  Review of Systems: Review of systems as noted in the HPI. All other systems reviewed and are negative.   Past Medical History:  Diagnosis Date   Aortic atherosclerosis (HCC) 08/03/2021   CAD (coronary artery disease) 08/03/2021   Cardiac catheterization September 2023 with RCA/RV marginal stenosis managed medically   Cardiomyopathy (HCC)    Carotid artery disease (HCC)     CKD (chronic kidney disease) stage 3, GFR 30-59 ml/min (HCC)    COPD (chronic obstructive pulmonary disease) (HCC)    Essential hypertension    Head and neck cancer (HCC) 2019   Right facial basal cell carcinoma s/p resection with right partial mastectomy and partal rhinectomy with skin graft (06/13/17)   Iron deficiency anemia    Urinary retention    Past Surgical History:  Procedure Laterality Date   ANKLE CLOSED REDUCTION Right    open reduction   BASAL CELL CARCINOMA EXCISION  2019   at [facility]   BIOPSY  07/19/2021   Procedure: BIOPSY;  Surgeon: Lanelle Bal, DO;  [facility];  Service: Endoscopy;;   COLONOSCOPY N/A 05/31/2017   Procedure: COLONOSCOPY;  Surgeon: Corbin Ade, MD;  [facility]: [facility];  Service: Endoscopy;  Laterality: N/A;  2:45pm   COLONOSCOPY WITH PROPOFOL N/A 07/19/2021   Procedure: COLONOSCOPY WITH PROPOFOL;  Surgeon: Lanelle Bal, DO;  [facility];  Service: Endoscopy;  Laterality: N/A;  3:00pm, moved up to 9:00   CYSTOSCOPY N/A 10/29/2018   Procedure: CYSTOSCOPY, CLOT EVACUATION;  Surgeon: Rene Paci, MD;  Location: [facility];  Service: Urology;  Laterality: N/A;   PARTIAL COLECTOMY Right 08/11/2021   Procedure: PARTIAL COLECTOMY, OPEN RIGHT HEMICOLECTOMY;  Surgeon: Lucretia Roers, MD;  Location: [facility];  Service: General;  Laterality: Right;   POLYPECTOMY  05/31/2017   Procedure: POLYPECTOMY;  Surgeon: Corbin Ade, MD;  [facility];  Service: Endoscopy;;   RIGHT/LEFT HEART  CATH AND CORONARY ANGIOGRAPHY N/A 10/22/2021   Procedure: RIGHT/LEFT HEART CATH AND CORONARY ANGIOGRAPHY;  Surgeon: Orbie Pyo, MD;  Location: [facility];  Service: Cardiovascular;  Laterality: N/A;   TONSILLECTOMY     TRANSCAROTID ARTERY REVASCULARIZATION  Right 02/15/2021   Procedure: RIGHT TRANSCAROTID ARTERY REVASCULARIZATION;  Surgeon: Cephus Shelling, MD;  Location[facility] OR;  Service: Vascular;   Laterality: Right;   ULTRASOUND GUIDANCE FOR VASCULAR ACCESS Left 02/15/2021   Procedure: ULTRASOUND GUIDANCE FOR VASCULAR ACCESS;  Surgeon: Cephus Shelling, MD;  Location: [facility];  Service: Vascular;  Laterality: Left;   XI ROBOTIC ASSISTED SIMPLE PROSTATECTOMY N/A 10/29/2018   Procedure: XI ROBOTIC ASSISTED SIMPLE PROSTATECTOMY;  Surgeon: Malen Gauze, MD;  Location: [facility];  Service: Urology;  Laterality: N/A;    Social History:  reports that he quit smoking about 12 months ago. His smoking use included cigarettes. He has a 20.00 pack-year smoking history. He has never been exposed to tobacco smoke. He has never used smokeless tobacco. He reports that he does not currently use alcohol. He reports that he does not use drugs.   No Known Allergies  Family History  Problem Relation Age of Onset   Stroke Father    Cirrhosis Mother    Colon cancer Neg Hx      Prior to Admission medications   Medication Sig Start Date End Date Taking? Authorizing Provider  acetaminophen (TYLENOL) 500 MG tablet Take 1,000 mg by mouth daily as needed for fever, headache or moderate pain.    [provider]  albuterol (VENTOLIN HFA) 108 (90 Base) MCG/ACT inhaler Inhale 2 puffs into the lungs every 6 (six) hours as needed for wheezing or shortness of breath. 04/27/22   Shahmehdi, Gemma Payor, MD  atorvastatin (LIPITOR) 40 MG tablet Take 1 tablet (40 mg total) by mouth every evening. 08/01/22 08/31/22  Shahmehdi, Gemma Payor, MD  Budeson-Glycopyrrol-Formoterol (BREZTRI AEROSPHERE) 160-9-4.8 MCG/ACT AERO Inhale 2 puffs into the lungs 2 (two) times daily.    [provider]  carvedilol (COREG) 6.25 MG tablet Take 1 tablet (6.25 mg total) by mouth 2 (two) times daily with a meal. 07/26/22 08/25/22  Shahmehdi, Gemma Payor, MD  clopidogrel (PLAVIX) 75 MG tablet Take 1 tablet (75 mg total) by mouth daily. 05/13/22   Strader, Lennart Pall, PA-C  furosemide (LASIX) 40 MG tablet Take 1 tablet (40 mg total) by mouth  daily. 07/26/22 08/25/22  Michael Bane, MD  isosorbide mononitrate (IMDUR) 60 MG 24 hr tablet Take 1 tablet (60 mg total) by mouth every evening. 05/13/22   Strader, Lennart Pall, PA-C  potassium chloride SA (KLOR-CON M) 20 MEQ tablet Take 1 tablet (20 mEq total) by mouth daily. 07/26/22 08/25/22  Michael Bane, MD    Physical Exam: BP (!) 147/95 (BP Location: Right Arm)   Pulse 80   Temp (!) 97.5 F (36.4 C) (Oral)   Resp 20   Ht 6' (1.829 m)   Wt 65.2 kg   SpO2 100%   BMI 19.49 kg/m   General: 67 y.o. year-old male well developed well nourished in no acute distress.  Alert and oriented x3. HEENT: NCAT, EOMI Neck: Supple, trachea medial Cardiovascular: Regular rate and rhythm with no rubs or gallops.  No thyromegaly or JVD noted.  No lower extremity edema. 2/4 pulses in all 4 extremities. Respiratory: Bilateral Rales in lower lobes on auscultation with no wheezes.   Abdomen: Soft, nontender nondistended with normal bowel sounds x4 quadrants.  Muskuloskeletal: No cyanosis, clubbing or edema noted bilaterally Neuro: CN II-XII intact, strength 5/5 x 4, sensation, reflexes intact Skin: No ulcerative lesions noted or rashes Psychiatry: Mood is appropriate for condition and setting          Labs on Admission:  Basic Metabolic Panel: Recent Labs  Lab 07/24/22 1130 07/24/22 1210 07/25/22 0337 07/26/22 0442 07/26/22 1458 07/28/22 1800 07/28/22 2228  NA  --  138 134* 139  --  139  --   K  --  4.2 2.8* 2.6* 3.3* 4.0  --   CL  --  106 104 101  --  101  --   CO2  --  21* 21* 27  --  29  --   GLUCOSE  --  148* 133* 118*  --  153*  --   BUN  --  44* 42* 32*  --  27*  --   CREATININE  --  2.33* 1.96* 1.72*  --  1.77*  --   CALCIUM  --  8.2* 7.4* 7.7*  --  8.7*  --   MG 2.0  --   --   --   --   --  1.9  PHOS 3.5  --   --   --   --   --  3.4   Liver Function Tests: Recent Labs  Lab 07/24/22 1210 07/25/22 0337 07/26/22 0442  AST 56* 35 38  ALT 216* 136* 117*  ALKPHOS 105  86 97  BILITOT 2.8* 2.0* 1.6*  PROT 6.3* 5.5* 6.1*  ALBUMIN 3.0* 2.6* 2.7*   No results for input(s): "LIPASE", "AMYLASE" in the last 168 hours. No results for input(s): "AMMONIA" in the last 168 hours. CBC: Recent Labs  Lab 07/24/22 1130 07/25/22 0337 07/26/22 0442 07/28/22 1800  WBC 12.5* 13.6* 9.7 8.3  NEUTROABS 9.8*  --   --   --   HGB 13.1 10.9* 11.4* 13.0  HCT 41.7 34.7* 36.3* 42.6  MCV 91.6 91.3 91.2 93.8  PLT 270 257 276 349   Cardiac Enzymes: No results for input(s): "CKTOTAL", "CKMB", "CKMBINDEX", "TROPONINI" in the last 168 hours.  BNP (last 3 results) Recent Labs    07/25/22 0337 07/26/22 0442 07/28/22 1800  BNP >4,500.0* 3,047.0* >4,500.0*    ProBNP (last 3 results) No results for input(s): "PROBNP" in the last 8760 hours.  CBG: Recent Labs  Lab 07/25/22 0854 07/26/22 0711  GLUCAP 135* 118*    Radiological Exams on Admission: CT Angio Chest PE W and/or Wo Contrast  Result Date: 07/28/2022 CLINICAL DATA:  Shortness of breath. EXAM: CT ANGIOGRAPHY CHEST WITH CONTRAST TECHNIQUE: Multidetector CT imaging of the chest was performed using the standard protocol during bolus administration of intravenous contrast. Multiplanar CT image reconstructions and MIPs were obtained to evaluate the vascular anatomy. RADIATION DOSE REDUCTION: This exam was performed according to the departmental dose-optimization program which includes automated exposure control, adjustment of the mA and/or kV according to patient size and/or use of iterative reconstruction technique. CONTRAST:  75mL OMNIPAQUE IOHEXOL 350 MG/ML SOLN COMPARISON:  April 25, 2022 FINDINGS: Cardiovascular: There is marked severity calcification of the thoracic aorta, without evidence of aortic aneurysm. Satisfactory opacification of the pulmonary arteries to the segmental level. No evidence of pulmonary embolism. There is mild cardiomegaly, with marked severity coronary artery calcification. No pericardial  effusion. Mediastinum/Nodes: No enlarged mediastinal, hilar, or axillary lymph nodes. Thyroid gland, trachea, and esophagus demonstrate no significant findings. Lungs/Pleura: There is moderate to marked severity lingular atelectasis and/or  infiltrate. Mild to moderate severity anteromedial right middle lobe and anterior right lower lobe scarring and/or atelectasis is also noted. These areas are new when compared to the prior study. The 4 mm posteromedial left lower lobe lung nodule seen on the prior study is not clearly identified on the current exam. A trace amount of pleural fluid is noted, bilaterally. No pneumothorax is identified. Upper Abdomen: Stable 7 mm and 14 mm foci of parenchymal low attenuation are seen within the anterior aspect of the right lobe of the liver. Multiple small parenchymal calcifications are seen within the visualized portion of body of the pancreas. Pancreatic ductal dilatation is also noted (approximately 8.2 cm in diameter). Musculoskeletal: Chronic right-sided rib fractures are seen. No acute osseous abnormalities are identified. Review of the MIP images confirms the above findings. IMPRESSION: 1. No evidence of pulmonary embolism. 2. Moderate to marked severity lingular atelectasis and/or infiltrate. 3. Mild to moderate severity anteromedial right middle lobe and anterior right lower lobe scarring and/or atelectasis. 4. Trace amount of pleural fluid, bilaterally. 5. Stable hepatic cysts. 6. Findings consistent with chronic pancreatitis. 7. Aortic atherosclerosis. Aortic Atherosclerosis (ICD10-I70.0). Electronically Signed   By: Aram Candela M.D.   On: 07/28/2022 22:27   DG Chest 2 View  Result Date: 07/28/2022 CLINICAL DATA:  SOB EXAM: CHEST - 2 VIEW COMPARISON:  Chest x-ray 07/24/2022 FINDINGS: Heart is prominent in size. The heart and mediastinal contours are unchanged. Aortic calcification. Right lower lung zone airspace opacity. No focal consolidation. No pulmonary  edema. No pleural effusion. No pneumothorax. No acute osseous abnormality. IMPRESSION: 1. Right lower lung zone airspace opacity. Followup PA and lateral chest X-ray is recommended in 3-4 weeks following therapy to ensure resolution and exclude underlying malignancy. 2.  Aortic Atherosclerosis (ICD10-I70.0). Electronically Signed   By: Tish Frederickson M.D.   On: 07/28/2022 18:09    EKG: I independently viewed the EKG done and my findings are as followed: Normal sinus rhythm at rate of 73 bpm with PVCs.  LVH with repolarization abnormality  Assessment/Plan Present on Admission:  Acute on chronic systolic CHF (congestive heart failure) (HCC)  Elevated brain natriuretic peptide (BNP) level  CAP (community acquired pneumonia)  Hypertensive emergency  Essential hypertension  CAD (coronary artery disease)  Chronic kidney disease, stage 3b (HCC)  Principal Problem:   Acute on chronic systolic CHF (congestive heart failure) (HCC) Active Problems:   Essential hypertension   CAP (community acquired pneumonia)   Hypertensive emergency   CAD (coronary artery disease)   Chronic kidney disease, stage 3b (HCC)   Elevated brain natriuretic peptide (BNP) level  Acute on chronic systolic CHF Elevated BNP BNP >1,610 (this was 3047 on 07/26/2022 and was > 4,500 on 07/25/2022 & 07/24/2022) CT was suggestive of bilateral pleural fluid Continue total input/output, daily weights and fluid restriction Continue IV Lasix 40 mg twice daily Continue heart healthy diet  Echocardiogram in the morning  Cardiology will be consulted  Possible CAP POA CT was suggestive of lingula infiltrate He was empirically treated with IV ceftriaxone and azithromycin We shall continue same at this time with plan to de-escalate/discontinue based on blood culture, sputum culture, urine Legionella, strep pneumo and procalcitonin Continue Tylenol as needed Continue Mucinex, incentive spirometry, flutter valve   Hypertensive  emergency-resolved Essential hypertension (uncontrolled) Continue Coreg, Imdur  CAD Statin appears to be held at this time per med rec Continue Plavix  CKD 3B BUN/creatinine 27/1.77 (creatinine is within baseline range). Renally adjust medications, avoid nephrotoxic agents/dehydration/hypotension  Goal  of care Palliative care will be consulted  DVT prophylaxis: Lovenox  Advance Care Planning: Full code  Consults: palliative care, cardiology  Family Communication: None at bedside  Severity of Illness: The appropriate patient status for this patient is INPATIENT. Inpatient status is judged to be reasonable and necessary in order to provide the required intensity of service to ensure the patient's safety. The patient's presenting symptoms, physical exam findings, and initial radiographic and laboratory data in the context of their chronic comorbidities is felt to place them at high risk for further clinical deterioration. Furthermore, it is not anticipated that the patient will be medically stable for discharge from the hospital within 2 midnights of admission.   * I certify that at the point of admission it is my clinical judgment that the patient will require inpatient hospital care spanning beyond 2 midnights from the point of admission due to high intensity of service, high risk for further deterioration and high frequency of surveillance required.*  Author: Frankey Shown, DO 07/29/2022 8:44 AM  For on call review [email or web address].

## 2022-07-29 ENCOUNTER — Inpatient Hospital Stay (HOSPITAL_COMMUNITY): Payer: 59

## 2022-07-29 ENCOUNTER — Other Ambulatory Visit (HOSPITAL_COMMUNITY): Payer: Self-pay | Admitting: *Deleted

## 2022-07-29 ENCOUNTER — Other Ambulatory Visit (HOSPITAL_COMMUNITY): Payer: 59

## 2022-07-29 ENCOUNTER — Encounter (HOSPITAL_COMMUNITY): Payer: Self-pay | Admitting: Internal Medicine

## 2022-07-29 DIAGNOSIS — I340 Nonrheumatic mitral (valve) insufficiency: Secondary | ICD-10-CM

## 2022-07-29 DIAGNOSIS — I2510 Atherosclerotic heart disease of native coronary artery without angina pectoris: Secondary | ICD-10-CM

## 2022-07-29 DIAGNOSIS — N1832 Chronic kidney disease, stage 3b: Secondary | ICD-10-CM | POA: Diagnosis not present

## 2022-07-29 DIAGNOSIS — I5043 Acute on chronic combined systolic (congestive) and diastolic (congestive) heart failure: Secondary | ICD-10-CM

## 2022-07-29 DIAGNOSIS — R7989 Other specified abnormal findings of blood chemistry: Secondary | ICD-10-CM

## 2022-07-29 DIAGNOSIS — I5030 Unspecified diastolic (congestive) heart failure: Secondary | ICD-10-CM

## 2022-07-29 DIAGNOSIS — I517 Cardiomegaly: Secondary | ICD-10-CM

## 2022-07-29 DIAGNOSIS — I5023 Acute on chronic systolic (congestive) heart failure: Secondary | ICD-10-CM | POA: Diagnosis not present

## 2022-07-29 LAB — STREP PNEUMONIAE URINARY ANTIGEN: Strep Pneumo Urinary Antigen: NEGATIVE

## 2022-07-29 LAB — MAGNESIUM: Magnesium: 1.9 mg/dL (ref 1.7–2.4)

## 2022-07-29 LAB — CULTURE, BLOOD (ROUTINE X 2): Special Requests: ADEQUATE

## 2022-07-29 LAB — ECHOCARDIOGRAM COMPLETE: Weight: 2299.84 oz

## 2022-07-29 LAB — PROCALCITONIN: Procalcitonin: 0.23 ng/mL

## 2022-07-29 LAB — PHOSPHORUS: Phosphorus: 3.4 mg/dL (ref 2.5–4.6)

## 2022-07-29 MED ORDER — ACETAMINOPHEN 325 MG PO TABS: 650.0000 mg | ORAL_TABLET | Freq: Four times a day (QID) | ORAL | Status: DC | PRN

## 2022-07-29 MED ORDER — FUROSEMIDE 10 MG/ML IJ SOLN
80.0000 mg | Freq: Two times a day (BID) | INTRAMUSCULAR | Status: DC
  Administered 2022-07-29 – 2022-07-31 (×4): 80 mg via INTRAVENOUS
  Filled 2022-07-29 (×4): qty 8

## 2022-07-29 MED ORDER — ALBUTEROL SULFATE (2.5 MG/3ML) 0.083% IN NEBU: 3.0000 mL | INHALATION_SOLUTION | Freq: Four times a day (QID) | RESPIRATORY_TRACT | Status: DC | PRN

## 2022-07-29 MED ORDER — IPRATROPIUM-ALBUTEROL 0.5-2.5 (3) MG/3ML IN SOLN
3.0000 mL | Freq: Three times a day (TID) | RESPIRATORY_TRACT | Status: DC
  Filled 2022-07-29: qty 3

## 2022-07-29 MED ORDER — BUDESON-GLYCOPYRROL-FORMOTEROL 160-9-4.8 MCG/ACT IN AERO: 2.0000 | INHALATION_SPRAY | Freq: Two times a day (BID) | RESPIRATORY_TRACT | Status: DC

## 2022-07-29 MED ORDER — CARVEDILOL 3.125 MG PO TABS
6.2500 mg | ORAL_TABLET | Freq: Two times a day (BID) | ORAL | Status: DC
  Administered 2022-07-29 – 2022-07-31 (×5): 6.25 mg via ORAL
  Filled 2022-07-29 (×5): qty 2

## 2022-07-29 MED ORDER — SODIUM CHLORIDE 0.9 % IV SOLN: 500.0000 mg | INTRAVENOUS | Status: DC

## 2022-07-29 MED ORDER — ISOSORBIDE MONONITRATE ER 60 MG PO TB24
60.0000 mg | ORAL_TABLET | Freq: Every evening | ORAL | Status: DC
  Administered 2022-07-29 – 2022-07-30 (×2): 60 mg via ORAL
  Filled 2022-07-29 (×2): qty 1

## 2022-07-29 MED ORDER — ACETAMINOPHEN 650 MG RE SUPP: 650.0000 mg | Freq: Four times a day (QID) | RECTAL | Status: DC | PRN

## 2022-07-29 MED ORDER — ARFORMOTEROL TARTRATE 15 MCG/2ML IN NEBU
15.0000 ug | INHALATION_SOLUTION | Freq: Two times a day (BID) | RESPIRATORY_TRACT | Status: DC
  Administered 2022-07-29 – 2022-07-31 (×4): 15 ug via RESPIRATORY_TRACT
  Filled 2022-07-29 (×4): qty 2

## 2022-07-29 MED ORDER — CLOPIDOGREL BISULFATE 75 MG PO TABS
75.0000 mg | ORAL_TABLET | Freq: Every day | ORAL | Status: DC
  Administered 2022-07-29 – 2022-07-31 (×3): 75 mg via ORAL
  Filled 2022-07-29 (×3): qty 1

## 2022-07-29 MED ORDER — ENOXAPARIN SODIUM 40 MG/0.4ML IJ SOSY
40.0000 mg | PREFILLED_SYRINGE | INTRAMUSCULAR | Status: DC
  Administered 2022-07-29 – 2022-07-31 (×3): 40 mg via SUBCUTANEOUS
  Filled 2022-07-29 (×4): qty 0.4

## 2022-07-29 MED ORDER — SODIUM CHLORIDE 0.9 % IV SOLN: 1.0000 g | INTRAVENOUS | Status: DC

## 2022-07-29 MED ORDER — BUDESONIDE 0.5 MG/2ML IN SUSP
0.5000 mg | Freq: Two times a day (BID) | RESPIRATORY_TRACT | Status: DC
  Administered 2022-07-29 – 2022-07-31 (×4): 0.5 mg via RESPIRATORY_TRACT
  Filled 2022-07-29 (×4): qty 2

## 2022-07-29 MED ORDER — MOMETASONE FURO-FORMOTEROL FUM 100-5 MCG/ACT IN AERO: 2.0000 | INHALATION_SPRAY | Freq: Two times a day (BID) | RESPIRATORY_TRACT | Status: DC

## 2022-07-29 MED ORDER — POTASSIUM CHLORIDE CRYS ER 20 MEQ PO TBCR
40.0000 meq | EXTENDED_RELEASE_TABLET | Freq: Two times a day (BID) | ORAL | Status: DC
  Administered 2022-07-29 – 2022-07-31 (×5): 40 meq via ORAL
  Filled 2022-07-29 (×5): qty 2

## 2022-07-29 MED ORDER — UMECLIDINIUM BROMIDE 62.5 MCG/ACT IN AEPB: 1.0000 | INHALATION_SPRAY | Freq: Every day | RESPIRATORY_TRACT | Status: DC

## 2022-07-29 MED ORDER — DM-GUAIFENESIN ER 30-600 MG PO TB12
1.0000 | ORAL_TABLET | Freq: Two times a day (BID) | ORAL | Status: DC
  Administered 2022-07-29 – 2022-07-31 (×5): 1 via ORAL
  Filled 2022-07-29 (×5): qty 1

## 2022-07-29 MED ORDER — FUROSEMIDE 10 MG/ML IJ SOLN
40.0000 mg | Freq: Two times a day (BID) | INTRAMUSCULAR | Status: DC
  Administered 2022-07-29: 40 mg via INTRAVENOUS
  Filled 2022-07-29: qty 4

## 2022-07-29 NOTE — Progress Notes (Signed)
Patient arrived from ED via wheelchair. Able to ambulate with standby assistance in room. Continent of bowel and bladder. Vitals WNL. Skin WNL ABX given via IV. Telemetry placed. Slept throughout the night with no c/o SOB or pain.

## 2022-07-29 NOTE — TOC Initial Note (Signed)
Transition of Care [facility]) - Initial/Assessment Note    Patient Details  Name: Michael [name] MRN: [id number] Date of Birth: 1956-01-17  [facility] CM/SW Contact:    Annice Needy, LCSW Phone Number: 07/29/2022, 12:05 PM  Clinical Narrative:                 Patient from home where he lives with friends. Admitted for CHF. Friends provide transport to [employer]. No DME in the home. [facility] was arranged at most recent discharge but have not made contact per [facility]. They called yesterday but did not call back. Rene Kocher states that patient takes medication as prescribed as far as she knows. Rene Kocher states that patient's doctors have been frequently changing his medications and she has to reorganize his pills in medication Dance movement psychotherapist. Plan is for patient to return home at d/c.  Expected Discharge Plan: Home w Home Health Services Barriers to Discharge: Continued Medical Work up   Patient Goals and CMS Choice            Expected Discharge Plan and Services       Living arrangements for the past 2 months: Single Family Home                                      Prior Living Arrangements/Services Living arrangements for the past 2 months: Single Family Home Lives with:: Friends Patient language and need for interpreter reviewed:: Yes Do you feel safe going back to the place where you live?: Yes      Need for Family Participation in Patient Care: Yes (Comment) Care giver support system in place?: Yes (comment) Current home services: Home PT Criminal Activity/Legal Involvement Pertinent to Current Situation/Hospitalization: No - Comment as needed  Activities of Daily Living Home Assistive Devices/Equipment: None ADL Screening (condition at time of admission) Patient's cognitive ability adequate to safely complete daily activities?: Yes Is the patient deaf or have difficulty hearing?: No Does the patient have difficulty seeing, even when wearing  glasses/contacts?: No Does the patient have difficulty concentrating, remembering, or making decisions?: No Patient able to express need for assistance with ADLs?: No Does the patient have difficulty dressing or bathing?: No Independently performs ADLs?: Yes (appropriate for developmental age) Communication: Independent Dressing (OT): Independent Grooming: Independent Feeding: Independent Bathing: Independent Toileting: Independent In/Out Bed: Independent Walks in Home: Independent Does the patient have difficulty walking or climbing stairs?: No Weakness of Legs: None Weakness of Arms/Hands: None  Permission Sought/Granted Permission sought to share information with : Family Supports    Share Information with NAME: Rene Kocher, friend           Emotional Assessment         Alcohol / Substance Use: Tobacco Use Psych Involvement: No (comment)  Admission diagnosis:  Acute on chronic systolic CHF (congestive heart failure) (HCC) [I50.23] Pneumonia due to infectious organism, unspecified laterality, unspecified part of lung [J18.9] Acute congestive heart failure, unspecified heart failure type (HCC) [I50.9] Patient Active Problem List   Diagnosis Date Noted   COPD exacerbation (HCC) 05/06/2022   Acute on chronic systolic CHF (congestive heart failure) (HCC) 05/05/2022   Troponin level elevated 04/29/2022   Elevated brain natriuretic peptide (BNP) level 04/29/2022   Pulmonary nodule 04/26/2022   Transaminitis 10/23/2021   Pressure injury of skin 10/22/2021   Type 2 MI (myocardial infarction) (HCC) 10/20/2021   Colonic mass 08/06/2021  Preoperative cardiovascular examination 08/03/2021   CAD (coronary artery disease) 08/03/2021   Chronic kidney disease, stage 3b (HCC) 08/03/2021   Aortic atherosclerosis (HCC) 08/03/2021   Rectal bleeding 06/16/2021   H/O adenomatous polyp of colon 06/16/2021   Carotid stenosis, asymptomatic, right 02/15/2021   Basal cell carcinoma of  canthus, right 12/06/2019   Incisional hernia, without obstruction or gangrene 11/15/2019   Elevated PSA 06/19/2019   Benign prostatic hyperplasia with urinary obstruction 10/29/2018   CAP (community acquired pneumonia) 05/09/2018   Hypertensive emergency 05/09/2018   Chronic HFrEF (heart failure with reduced ejection fraction) (HCC) 05/09/2018   Lobar pneumonia (HCC) 05/09/2018   Urinary retention 11/27/2017   Vasovagal syncope 09/17/2017   Syncope and collapse 07/05/2017   COPD with acute exacerbation (HCC) 07/05/2017   Acute renal failure superimposed on chronic kidney disease (HCC) 07/05/2017   Catheter-associated urinary tract infection (HCC) 07/05/2017   Leukocytosis 07/05/2017   Urinary retention with incomplete bladder emptying 07/05/2017   UTI (urinary tract infection) 07/05/2017   Orthostatic syncope    Syncope due to orthostatic hypotension 06/24/2017   Heart murmur 06/24/2017   Abnormal PET scan of colon 05/25/2017   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 05/15/2017   Periapical abscess 04/04/2017   Iron (Fe) deficiency anemia 04/04/2017   Essential hypertension 04/02/2017   Anemia 04/02/2017   Acute hypoxemic respiratory failure (HCC) 04/02/2017   Tobacco abuse 04/02/2017   Thrombocytosis 04/02/2017   Hypokalemia 04/02/2017   Skin lesion of face    Nasal lesion    PCP:  Benita Stabile, MD Pharmacy:   [facility] [place], [place] - [address] [place] [address] Phone: [phone] Fax: [phone]     Social Determinants of Health (SDOH) Social History: SDOH Screenings   Food Insecurity: No Food Insecurity (07/29/2022)  Housing: Low Risk  (07/29/2022)  Recent Concern: Housing - Medium Risk (05/05/2022)  Transportation Needs: No Transportation Needs (07/29/2022)  Recent Concern: Transportation Needs - Unmet Transportation Needs (05/05/2022)  Utilities: Not At Risk (07/29/2022)  Alcohol Screen: Low Risk  (12/11/2019)   Depression (PHQ2-9): Low Risk  (12/11/2019)  Financial Resource Strain: Low Risk  (12/11/2019)  Physical Activity: Inactive (12/11/2019)  Social Connections: Socially Isolated (12/11/2019)  Stress: No Stress Concern Present (12/11/2019)  Tobacco Use: Medium Risk (07/29/2022)   SDOH Interventions:     Readmission Risk Interventions    07/25/2022    1:09 PM 04/26/2022   11:46 AM 10/26/2021    2:57 PM  Readmission Risk Prevention Plan  Transportation Screening Complete Complete Complete  PCP or Specialist Appt within 3-5 Days   Complete  HRI or Home Care Consult  Complete Complete  Social Work Consult for Recovery Care Planning/Counseling  Complete Complete  Palliative Care Screening  Not Applicable Not Applicable  Medication Review Oceanographer) Complete Complete Complete  PCP or Specialist appointment within 3-5 days of discharge Not Complete    HRI or Home Care Consult Complete    SW Recovery Care/Counseling Consult Complete    Palliative Care Screening Complete    Skilled Nursing Facility Not Applicable

## 2022-07-29 NOTE — Progress Notes (Signed)
PROGRESS NOTE  Michael [name] ZOX:[id number] DOB: 08-30-1955 DOA: 07/28/2022 PCP: Benita Stabile, MD  Brief History:  67 year old male with a history of HFrEF, CKD stage III, coronary disease, COPD, tobacco abuse, basal cell carcinoma status post partial rhinectomy and maxillectomy 06/06/2017 at [facility], advanced colonic tubular adenoma, right carotid stenosis, hypertension, presenting with shortness of breath since he was discharged from the hospital on 07/26/2022. As noted, the patient has numerous hospitalizations, most recently from 07/24/2022 to 07/26/2022 during which she was treated for acute on chronic HFrEF.  He was discharged with furosemide 40 mg once daily.  His Coreg was increased to 6.25 mg twice daily.  His discharge weight was 64.6 kg.  Prior to that, he had another hospital admission 05/05/2022 to 05/06/2022 again for acute on chronic HFrEF and COPD exacerbation.  His discharge weight was 67.5 kg.  Other hospitalizations in 2024 as below: Admitted to the hospital from 04/29/2022 to 05/03/2022 for syncope and COPD exacerbation. He was discharged with prednisone taper, albuterol. It was felt his syncope was related to orthostasis. His Entresto and amlodipine were discontinued during that hospitalization. Prior to that, the patient was also hospitalized from 04/25/2022 to 04/27/2022 due to elevated troponin. This was thought to be related to demand ischemia. He also had some asymptomatic NSVT.     His medical treatment has been difficult secondary to his social situation and current homelessness, poor compliance, and poor insight. He presented again on the evening of 07/28/2022 secondary to shortness of breath and dyspnea on exertion.  He denies any fevers, chills, chest pain, nausea, vomiting, direct abdominal pain.  In the ED, the patient was afebrile and hemodynamically stable with oxygen saturation 100% room air.  WBC 8.3, hemoglobin 13.0, platelets 349,000.  Sodium 139, potassium 4.0,  bicarbonate 29, serum creatinine 1.77.  CTA chest was negative for PE but showed moderate to severe lingular atelectasis and/or infiltrate.  There is trace bilateral pleural effusions.  Chest x-ray showed right lower lobe opacity.  The patient was given IV furosemide and admitted for further evaluation and treatment.   Assessment/Plan: Dyspnea -Multifactorial including COPD HFrEF, and deconditioning -6/27 CTA chest--no PE, trace bilateral pleural eff -Started DuoNebs -Started Pulmicort -start brovana -PCT 0.23 -d/c abx   Elevated troponin -No chest pain presently -Secondary to demand ischemia -Cardiology consult appreciated--no further inpt workup -04/03/2022 echo EF 35-40%, global HK, normal PASP, mild TR   Acute on Chronic HFrEF -Entresto was discontinued during his Early April 2024 hospitalization -04/30/2022 echo EF 35-40%, global HK, normal PASP, mild TR -Resume carvedilol--increased to 12.5 mg bid -ReDS = 39 -increase lasix to 80 IV bid   CKD stage IIIb -Baseline creatinine 1.7-2.0   Essential hypertension -continue coreg   Tobacco abuse -Tobacco cessation discussed -pt now states he quit one month ago   Right Carotid stenosis -The patient has follow-up with Dr. Leonette Most fields -05/12/2017 carotid ultrasound shows 80-99% right carotid stenosis -70% heavily calcified RICA stenosis and no significant left-sided disease by CT angiography.  He is now following with Dr. Olga Coaster was not felt to be a candidate for carotid stenting and suboptimal candidate for endarterectomy. Plan is for medical therapy at this point -continue plavix   Basal cell carcinoma of nasolabial fold -06/06/2017--right partial rhinectomy, resection of left intranasal lesion, skin graft @[facility] -Margins were clear on pathology -Most recent follow-up was 06/23/2017   Iron deficiency anemia/Advanced colonic adenoma -The patient had colonoscopy 05/31/2017--Dr. Rourk -He was  noted to have numerous polyps as  well as a polypoid mass in the hepatic flexure to the cecum area -pathology of mass--tubular adenoma -He was referred to general surgery, Dr. Larae Grooms for hemicolectomy -He has been lost to follow-up -noncompliant with po iron    Family Communication:  no  Family at bedside  Consultants:  cardiology  Code Status:  FULL   DVT Prophylaxis: [place] Lovenox   Procedures: As Listed in Progress Note Above  Antibiotics: Ceftriaxone 6/27 Azithro 6/27      Subjective: Pt states he is breathing better.  Patient denies fevers, chills, headache, chest pain, dyspnea, nausea, vomiting, diarrhea, abdominal pain, dysuria, hematuria, hematochezia, and melena.   Objective: Vitals:   07/28/22 2233 07/28/22 2338 07/29/22 0325 07/29/22 0846  BP: (!) 167/74 (!) 173/90 (!) 147/95 (!) 159/96  Pulse: 84 91 80 76  Resp: (!) 22 20 20    Temp:  97.8 F (36.6 C) (!) 97.5 F (36.4 C) 97.8 F (36.6 C)  TempSrc:  Oral Oral Oral  SpO2: 95% 100% 100% 98%  Weight:  65.2 kg    Height:  6' (1.829 m)     No intake or output data in the 24 hours ending 07/29/22 0934 Weight change:  Exam:  General:  Pt is alert, follows commands appropriately, not in acute distress HEENT: No icterus, No thrush, No neck mass, [place]/AT Cardiovascular: RRR, S1/S2, no rubs, no gallops Respiratory: bibasilar crackles no wheeze Abdomen: Soft/+BS, non tender, non distended, no guarding Extremities: trace LEedema, No lymphangitis, No petechiae, No rashes, no synovitis   Data Reviewed: I have personally reviewed following labs and imaging studies Basic Metabolic Panel: Recent Labs  Lab 07/24/22 1130 07/24/22 1210 07/25/22 0337 07/26/22 0442 07/26/22 1458 07/28/22 1800 07/28/22 2228  NA  --  138 134* 139  --  139  --   K  --  4.2 2.8* 2.6* 3.3* 4.0  --   CL  --  106 104 101  --  101  --   CO2  --  21* 21* 27  --  29  --   GLUCOSE  --  148* 133* 118*  --  153*  --   BUN  --  44* 42* 32*  --  27*  --    CREATININE  --  2.33* 1.96* 1.72*  --  1.77*  --   CALCIUM  --  8.2* 7.4* 7.7*  --  8.7*  --   MG 2.0  --   --   --   --   --  1.9  PHOS 3.5  --   --   --   --   --  3.4   Liver Function Tests: Recent Labs  Lab 07/24/22 1210 07/25/22 0337 07/26/22 0442  AST 56* 35 38  ALT 216* 136* 117*  ALKPHOS 105 86 97  BILITOT 2.8* 2.0* 1.6*  PROT 6.3* 5.5* 6.1*  ALBUMIN 3.0* 2.6* 2.7*   No results for input(s): "LIPASE", "AMYLASE" in the last 168 hours. No results for input(s): "AMMONIA" in the last 168 hours. Coagulation Profile: Recent Labs  Lab 07/24/22 1130 07/25/22 0337 07/28/22 1800  INR 1.5* 1.6* 1.1   CBC: Recent Labs  Lab 07/24/22 1130 07/25/22 0337 07/26/22 0442 07/28/22 1800  WBC 12.5* 13.6* 9.7 8.3  NEUTROABS 9.8*  --   --   --   HGB 13.1 10.9* 11.4* 13.0  HCT 41.7 34.7* 36.3* 42.6  MCV 91.6 91.3 91.2 93.8  PLT 270 257 276  349   Cardiac Enzymes: No results for input(s): "CKTOTAL", "CKMB", "CKMBINDEX", "TROPONINI" in the last 168 hours. BNP: Invalid input(s): "POCBNP" CBG: Recent Labs  Lab 07/25/22 0854 07/26/22 0711  GLUCAP 135* 118*   HbA1C: No results for input(s): "HGBA1C" in the last 72 hours. Urine analysis:    Component Value Date/Time   COLORURINE COLORLESS (A) 05/05/2022 0830   APPEARANCEUR CLEAR 05/05/2022 0830   APPEARANCEUR Cloudy (A) 06/16/2021 0936   LABSPEC 1.005 05/05/2022 0830   LABSPEC 1.018 04/23/2011 1044   PHURINE 5.0 05/05/2022 0830   GLUCOSEU NEGATIVE 05/05/2022 0830   GLUCOSEU Negative 04/23/2011 1044   HGBUR NEGATIVE 05/05/2022 0830   BILIRUBINUR NEGATIVE 05/05/2022 0830   BILIRUBINUR Negative 06/16/2021 0936   BILIRUBINUR Negative 04/23/2011 1044   KETONESUR NEGATIVE 05/05/2022 0830   PROTEINUR NEGATIVE 05/05/2022 0830   NITRITE NEGATIVE 05/05/2022 0830   LEUKOCYTESUR SMALL (A) 05/05/2022 0830   LEUKOCYTESUR Trace 04/23/2011 1044   Sepsis Labs: @LABRCNTIP (procalcitonin:4,lacticidven:4) ) Recent Results (from the  past 240 hour(s))  Resp panel by RT-PCR (RSV, Flu A&B, Covid) Anterior Nasal Swab     Status: None   Collection Time: 07/24/22 11:47 AM   Specimen: Anterior Nasal Swab  Result Value Ref Range Status   SARS Coronavirus 2 by RT PCR NEGATIVE NEGATIVE Final    Comment: (NOTE) SARS-CoV-2 target nucleic acids are NOT DETECTED.  The SARS-CoV-2 RNA is generally detectable in upper respiratory specimens during the acute phase of infection. The lowest concentration of SARS-CoV-2 viral copies this assay can detect is 138 copies/mL. A negative result does not preclude SARS-Cov-2 infection and should not be used as the sole basis for treatment or other patient management decisions. A negative result may occur with  improper specimen collection/handling, submission of specimen other than nasopharyngeal swab, presence of viral mutation(s) within the areas targeted by this assay, and inadequate number of viral copies(<138 copies/mL). A negative result must be combined with clinical observations, patient history, and epidemiological information. The expected result is Negative.  Fact Sheet for Patients:  BloggerCourse.com  Fact Sheet for Healthcare Providers:  SeriousBroker.it  This test is no t yet approved or cleared by the [place] FDA and  has been authorized for detection and/or diagnosis of SARS-CoV-2 by FDA under an Emergency Use Authorization (EUA). This EUA will remain  in effect (meaning this test can be used) for the duration of the COVID-19 declaration under Section 564(b)(1) of the Act, 21 U.S.C.section 360bbb-3(b)(1), unless the authorization is terminated  or revoked sooner.       Influenza A by PCR NEGATIVE NEGATIVE Final   Influenza B by PCR NEGATIVE NEGATIVE Final    Comment: (NOTE) The Xpert Xpress SARS-CoV-2/FLU/RSV plus assay is intended as an aid in the diagnosis of influenza from Nasopharyngeal swab specimens  and should not be used as a sole basis for treatment. Nasal washings and aspirates are unacceptable for Xpert Xpress SARS-CoV-2/FLU/RSV testing.  Fact Sheet for Patients: BloggerCourse.com  Fact Sheet for Healthcare Providers: SeriousBroker.it  This test is not yet approved or cleared by the [place] FDA and has been authorized for detection and/or diagnosis of SARS-CoV-2 by FDA under an Emergency Use Authorization (EUA). This EUA will remain in effect (meaning this test can be used) for the duration of the COVID-19 declaration under Section 564(b)(1) of the Act, 21 U.S.C. section 360bbb-3(b)(1), unless the authorization is terminated or revoked.     Resp Syncytial Virus by PCR NEGATIVE NEGATIVE Final    Comment: (NOTE) Fact  Sheet for Patients: BloggerCourse.com  Fact Sheet for Healthcare Providers: SeriousBroker.it  This test is not yet approved or cleared by the [place] FDA and has been authorized for detection and/or diagnosis of SARS-CoV-2 by FDA under an Emergency Use Authorization (EUA). This EUA will remain in effect (meaning this test can be used) for the duration of the COVID-19 declaration under Section 564(b)(1) of the Act, 21 U.S.C. section 360bbb-3(b)(1), unless the authorization is terminated or revoked.  Performed at [facility], [address], [place], [place] [address]   MRSA Next Gen by PCR, Nasal     Status: None   Collection Time: 07/24/22  7:30 PM   Specimen: Nasal Mucosa; Nasal Swab  Result Value Ref Range Status   MRSA by PCR Next Gen NOT DETECTED NOT DETECTED Final    Comment: (NOTE) The GeneXpert MRSA Assay (FDA approved for NASAL specimens only), is one component of a comprehensive MRSA colonization surveillance program. It is not intended to diagnose MRSA infection nor to guide or monitor treatment for MRSA infections. Test  performance is not FDA approved in patients less than 58 years old. Performed at [facility], [address], [place], [place] [address]      Scheduled Meds:  carvedilol  6.25 mg Oral BID WC   clopidogrel  75 mg Oral Daily   dextromethorphan-guaiFENesin  1 tablet Oral BID   enoxaparin (LOVENOX) injection  40 mg Subcutaneous Q24H   furosemide  40 mg Intravenous BID   isosorbide mononitrate  60 mg Oral QPM   umeclidinium bromide  1 puff Inhalation Daily   And   mometasone-formoterol  2 puff Inhalation BID   Continuous Infusions:  azithromycin     cefTRIAXone (ROCEPHIN)  IV      Procedures/Studies: CT Angio Chest PE W and/or Wo Contrast  Result Date: 07/28/2022 CLINICAL DATA:  Shortness of breath. EXAM: CT ANGIOGRAPHY CHEST WITH CONTRAST TECHNIQUE: Multidetector CT imaging of the chest was performed using the standard protocol during bolus administration of intravenous contrast. Multiplanar CT image reconstructions and MIPs were obtained to evaluate the vascular anatomy. RADIATION DOSE REDUCTION: This exam was performed according to the departmental dose-optimization program which includes automated exposure control, adjustment of the mA and/or kV according to patient size and/or use of iterative reconstruction technique. CONTRAST:  75mL OMNIPAQUE IOHEXOL 350 MG/ML SOLN COMPARISON:  April 25, 2022 FINDINGS: Cardiovascular: There is marked severity calcification of the thoracic aorta, without evidence of aortic aneurysm. Satisfactory opacification of the pulmonary arteries to the segmental level. No evidence of pulmonary embolism. There is mild cardiomegaly, with marked severity coronary artery calcification. No pericardial effusion. Mediastinum/Nodes: No enlarged mediastinal, hilar, or axillary lymph nodes. Thyroid gland, trachea, and esophagus demonstrate no significant findings. Lungs/Pleura: There is moderate to marked severity lingular atelectasis and/or infiltrate. Mild to moderate severity  anteromedial right middle lobe and anterior right lower lobe scarring and/or atelectasis is also noted. These areas are new when compared to the prior study. The 4 mm posteromedial left lower lobe lung nodule seen on the prior study is not clearly identified on the current exam. A trace amount of pleural fluid is noted, bilaterally. No pneumothorax is identified. Upper Abdomen: Stable 7 mm and 14 mm foci of parenchymal low attenuation are seen within the anterior aspect of the right lobe of the liver. Multiple small parenchymal calcifications are seen within the visualized portion of body of the pancreas. Pancreatic ductal dilatation is also noted (approximately 8.2 cm in diameter). Musculoskeletal: Chronic right-sided rib  fractures are seen. No acute osseous abnormalities are identified. Review of the MIP images confirms the above findings. IMPRESSION: 1. No evidence of pulmonary embolism. 2. Moderate to marked severity lingular atelectasis and/or infiltrate. 3. Mild to moderate severity anteromedial right middle lobe and anterior right lower lobe scarring and/or atelectasis. 4. Trace amount of pleural fluid, bilaterally. 5. Stable hepatic cysts. 6. Findings consistent with chronic pancreatitis. 7. Aortic atherosclerosis. Aortic Atherosclerosis (ICD10-I70.0). Electronically Signed   By: Aram Candela M.D.   On: 07/28/2022 22:27   DG Chest 2 View  Result Date: 07/28/2022 CLINICAL DATA:  SOB EXAM: CHEST - 2 VIEW COMPARISON:  Chest x-ray 07/24/2022 FINDINGS: Heart is prominent in size. The heart and mediastinal contours are unchanged. Aortic calcification. Right lower lung zone airspace opacity. No focal consolidation. No pulmonary edema. No pleural effusion. No pneumothorax. No acute osseous abnormality. IMPRESSION: 1. Right lower lung zone airspace opacity. Followup PA and lateral chest X-ray is recommended in 3-4 weeks following therapy to ensure resolution and exclude underlying malignancy. 2.  Aortic  Atherosclerosis (ICD10-I70.0). Electronically Signed   By: Tish Frederickson M.D.   On: 07/28/2022 18:09   DG Chest Port 1 View  Result Date: 07/24/2022 CLINICAL DATA:  Shortness of breath. EXAM: PORTABLE CHEST 1 VIEW COMPARISON:  07/19/2022 FINDINGS: The cardio pericardial silhouette is enlarged. Interstitial markings are diffusely coarsened with chronic features. No overt airspace pulmonary edema or focal consolidation. Hazy opacity at the left base may be atelectasis and/or layering pleural fluid. Telemetry leads overlie the chest. IMPRESSION: 1. Chronic interstitial coarsening without overt airspace pulmonary edema. 2. Possible layering left pleural effusion. Electronically Signed   By: Kennith Center M.D.   On: 07/24/2022 11:59   DG Chest 2 View  Result Date: 07/21/2022 CLINICAL DATA:  COPD, shortness of breath EXAM: CHEST - 2 VIEW COMPARISON:  05/07/2022 FINDINGS: Transverse diameter of heart is increased. Central pulmonary vessels are more prominent. Patchy densities are noted in right lower lung field in the PA view which could not be localized in the lateral view. There is slight prominence of interstitial markings in the parahilar regions and lower lung fields. There is no pleural effusion or pneumothorax. IMPRESSION: Cardiomegaly. Central pulmonary vessels are more prominent suggesting CHF. Small patchy densities are seen in the lateral aspect of right lower lung field seen only in the PA view. This may suggest atelectasis/pneumonia. There is no pleural effusion or pneumothorax. Electronically Signed   By: Ernie Avena M.D.   On: 07/21/2022 10:07   US Abdomen Limited RUQ (LIVER/GB)  Result Date: 07/21/2022 CLINICAL DATA:  Abnormal liver function tests EXAM: ULTRASOUND ABDOMEN LIMITED RIGHT UPPER QUADRANT COMPARISON:  CT done on 01/25/2022 FINDINGS: Gallbladder: There is large echogenic structure measuring 3.2 cm with acoustic shadowing in gallbladder fossa suggesting large gallbladder  stone. Technologist did not observe any tenderness over the gallbladder fossa. Gallbladder is not distended. Common bile duct: Diameter: 4 mm Liver: There is increased echogenicity in liver. No focal abnormalities are seen in visualized portions of liver. Portal vein is patent on color Doppler imaging with normal direction of blood flow towards the liver. Other: None. IMPRESSION: Large gallbladder stone is noted filling the gallbladder. There are no imaging signs of acute cholecystitis. There is no dilation of bile ducts. There is increased echogenicity in liver suggesting possible fatty infiltration. Electronically Signed   By: Ernie Avena M.D.   On: 07/21/2022 10:03    Catarina Hartshorn, DO  [facility]  If 7PM-7AM, please contact night-coverage  [email or web address] Password [facility] 07/29/2022, 9:34 AM   LOS: 1 day

## 2022-07-29 NOTE — ED Provider Notes (Signed)
[facility] MEDICAL SURGICAL UNIT Provider Note  CSN: [id number] Arrival date & time: 07/28/22 1635  Chief Complaint(s) Shortness of Breath  HPI Michael [name] is a 67 y.o. male with PMH CHF, CAD, carotid artery stenosis status post TCAR, HTN, CKD 3, COPD, recent hospital admission for CHF exacerbation and discharged 2 days ago on 07/26/2022 who presents emergency department for evaluation of persistent shortness of breath and dyspnea on exertion.  Patient states that he feels that he may have been discharged too early and is having trouble getting around due to persistent dyspnea on exertion.  He saw his cardiologist today who help facilitate transfer to the ER as the patient requested to return.  Here in emergency room, patient is tachypneic but maintaining oxygen saturations on room air he endorses shortness of breath but denies chest pain, headache, fever or other systemic symptoms   Past Medical History Past Medical History:  Diagnosis Date   Aortic atherosclerosis (HCC) 08/03/2021   CAD (coronary artery disease) 08/03/2021   Cardiac catheterization September 2023 with RCA/RV marginal stenosis managed medically   Cardiomyopathy (HCC)    Carotid artery disease (HCC)    CKD (chronic kidney disease) stage 3, GFR 30-59 ml/min (HCC)    COPD (chronic obstructive pulmonary disease) (HCC)    Essential hypertension    Head and neck cancer (HCC) 2019   Right facial basal cell carcinoma s/p resection with right partial mastectomy and partal rhinectomy with skin graft (06/13/17)   Iron deficiency anemia    Urinary retention    Patient Active Problem List   Diagnosis Date Noted   COPD exacerbation (HCC) 05/06/2022   Acute on chronic systolic CHF (congestive heart failure) (HCC) 05/05/2022   Troponin level elevated 04/29/2022   Elevated brain natriuretic peptide (BNP) level 04/29/2022   Pulmonary nodule 04/26/2022   Transaminitis 10/23/2021   Pressure injury of skin 10/22/2021   Type 2 MI  (myocardial infarction) (HCC) 10/20/2021   Colonic mass 08/06/2021   Preoperative cardiovascular examination 08/03/2021   CAD (coronary artery disease) 08/03/2021   Chronic kidney disease, stage 3b (HCC) 08/03/2021   Aortic atherosclerosis (HCC) 08/03/2021   Rectal bleeding 06/16/2021   H/O adenomatous polyp of colon 06/16/2021   Carotid stenosis, asymptomatic, right 02/15/2021   Basal cell carcinoma of canthus, right 12/06/2019   Incisional hernia, without obstruction or gangrene 11/15/2019   Elevated PSA 06/19/2019   Benign prostatic hyperplasia with urinary obstruction 10/29/2018   Chronic HFrEF (heart failure with reduced ejection fraction) (HCC) 05/09/2018   Lobar pneumonia (HCC) 05/09/2018   Urinary retention 11/27/2017   Vasovagal syncope 09/17/2017   Syncope and collapse 07/05/2017   COPD with acute exacerbation (HCC) 07/05/2017   Acute renal failure superimposed on chronic kidney disease (HCC) 07/05/2017   Catheter-associated urinary tract infection (HCC) 07/05/2017   Leukocytosis 07/05/2017   Urinary retention with incomplete bladder emptying 07/05/2017   UTI (urinary tract infection) 07/05/2017   Orthostatic syncope    Syncope due to orthostatic hypotension 06/24/2017   Heart murmur 06/24/2017   Abnormal PET scan of colon 05/25/2017   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 05/15/2017   Periapical abscess 04/04/2017   Iron (Fe) deficiency anemia 04/04/2017   Essential hypertension 04/02/2017   Anemia 04/02/2017   Acute hypoxemic respiratory failure (HCC) 04/02/2017   Tobacco abuse 04/02/2017   Thrombocytosis 04/02/2017   Hypokalemia 04/02/2017   Skin lesion of face    Nasal lesion    Home Medication(s) Prior to Admission medications   Medication  Sig Start Date End Date Taking? Authorizing Provider  acetaminophen (TYLENOL) 500 MG tablet Take 1,000 mg by mouth daily as needed for fever, headache or moderate pain.    [provider]  albuterol  (VENTOLIN HFA) 108 (90 Base) MCG/ACT inhaler Inhale 2 puffs into the lungs every 6 (six) hours as needed for wheezing or shortness of breath. 04/27/22   Shahmehdi, Gemma Payor, MD  atorvastatin (LIPITOR) 40 MG tablet Take 1 tablet (40 mg total) by mouth every evening. 08/01/22 08/31/22  Shahmehdi, Gemma Payor, MD  Budeson-Glycopyrrol-Formoterol (BREZTRI AEROSPHERE) 160-9-4.8 MCG/ACT AERO Inhale 2 puffs into the lungs 2 (two) times daily.    [provider]  carvedilol (COREG) 6.25 MG tablet Take 1 tablet (6.25 mg total) by mouth 2 (two) times daily with a meal. 07/26/22 08/25/22  Shahmehdi, Gemma Payor, MD  clopidogrel (PLAVIX) 75 MG tablet Take 1 tablet (75 mg total) by mouth daily. 05/13/22   Strader, Lennart Pall, PA-C  furosemide (LASIX) 40 MG tablet Take 1 tablet (40 mg total) by mouth daily. 07/26/22 08/25/22  Kendell Bane, MD  isosorbide mononitrate (IMDUR) 60 MG 24 hr tablet Take 1 tablet (60 mg total) by mouth every evening. 05/13/22   Strader, Lennart Pall, PA-C  potassium chloride SA (KLOR-CON M) 20 MEQ tablet Take 1 tablet (20 mEq total) by mouth daily. 07/26/22 08/25/22  Kendell Bane, MD                                                                                                                                    Past Surgical History Past Surgical History:  Procedure Laterality Date   ANKLE CLOSED REDUCTION Right    open reduction   BASAL CELL CARCINOMA EXCISION  2019   at [facility]   BIOPSY  07/19/2021   Procedure: BIOPSY;  Surgeon: Lanelle Bal, DO;  Location: [facility];  Service: Endoscopy;;   COLONOSCOPY N/A 05/31/2017   Procedure: COLONOSCOPY;  Surgeon: Corbin Ade, MD;  Location: [facility];  Service: Endoscopy;  Laterality: N/A;  2:45pm   COLONOSCOPY WITH PROPOFOL N/A 07/19/2021   Procedure: COLONOSCOPY WITH PROPOFOL;  Surgeon: Lanelle Bal, DO;  Location: [facility];  Service: Endoscopy;  Laterality: N/A;  3:00pm, moved up to 9:00   CYSTOSCOPY N/A 10/29/2018    Procedure: CYSTOSCOPY, CLOT EVACUATION;  Surgeon: Rene Paci, MD;  Location: [facility];  Service: Urology;  Laterality: N/A;   PARTIAL COLECTOMY Right 08/11/2021   Procedure: PARTIAL COLECTOMY, OPEN RIGHT HEMICOLECTOMY;  Surgeon: Lucretia Roers, MD;  Location: [facility];  Service: General;  Laterality: Right;   POLYPECTOMY  05/31/2017   Procedure: POLYPECTOMY;  Surgeon: Corbin Ade, MD;  Location: [facility];  Service: Endoscopy;;   RIGHT/LEFT HEART CATH AND CORONARY ANGIOGRAPHY N/A 10/22/2021   Procedure: RIGHT/LEFT HEART CATH AND CORONARY ANGIOGRAPHY;  Surgeon: Orbie Pyo, MD;  Location: [facility] INVASIVE CV  LAB;  Service: Cardiovascular;  Laterality: N/A;   TONSILLECTOMY     TRANSCAROTID ARTERY REVASCULARIZATION  Right 02/15/2021   Procedure: RIGHT TRANSCAROTID ARTERY REVASCULARIZATION;  Surgeon: Cephus Shelling, MD;  Location: [facility] OR;  Service: Vascular;  Laterality: Right;   ULTRASOUND GUIDANCE FOR VASCULAR ACCESS Left 02/15/2021   Procedure: ULTRASOUND GUIDANCE FOR VASCULAR ACCESS;  Surgeon: Cephus Shelling, MD;  Location: [facility] OR;  Service: Vascular;  Laterality: Left;   XI ROBOTIC ASSISTED SIMPLE PROSTATECTOMY N/A 10/29/2018   Procedure: XI ROBOTIC ASSISTED SIMPLE PROSTATECTOMY;  Surgeon: Malen Gauze, MD;  Location: [facility];  Service: Urology;  Laterality: N/A;   Family History Family History  Problem Relation Age of Onset   Stroke Father    Cirrhosis Mother    Colon cancer Neg Hx     Social History Social History   Tobacco Use   Smoking status: Former    Packs/day: 0.50    Years: 40.00    Additional pack years: 0.00    Total pack years: 20.00    Types: Cigarettes    Quit date: 07/02/2021    Years since quitting: 1.0    Passive exposure: Never   Smokeless tobacco: Never   Tobacco comments:    Quit on 06/09/21  Vaping Use   Vaping Use: Never used  Substance Use Topics   Alcohol use: Not Currently   Drug use: Never   Allergies Patient has  no known allergies.  Review of Systems Review of Systems  Respiratory:  Positive for shortness of breath.     Physical Exam Vital Signs  I have reviewed the triage vital signs BP (!) 173/90   Pulse 91   Temp 97.8 F (36.6 C) (Oral)   Resp 20   Ht 6' (1.829 m)   Wt 65.2 kg   SpO2 100%   BMI 19.49 kg/m   Physical Exam Constitutional:      General: He is not in acute distress.    Appearance: Normal appearance.  HENT:     Head: Normocephalic and atraumatic.     Nose: No congestion or rhinorrhea.  Eyes:     General:        Right eye: No discharge.        Left eye: No discharge.     Extraocular Movements: Extraocular movements intact.     Pupils: Pupils are equal, round, and reactive to light.  Cardiovascular:     Rate and Rhythm: Normal rate and regular rhythm.     Heart sounds: No murmur heard. Pulmonary:     Effort: No respiratory distress.     Breath sounds: Rales present. No wheezing.  Abdominal:     General: There is no distension.     Tenderness: There is no abdominal tenderness.  Musculoskeletal:        General: Normal range of motion.     Cervical back: Normal range of motion.  Skin:    General: Skin is warm and dry.  Neurological:     General: No focal deficit present.     Mental Status: He is alert.     ED Results and Treatments Labs (all labs ordered are listed, but only abnormal results are displayed) Labs Reviewed  BASIC METABOLIC PANEL - Abnormal; Notable for the following components:      Result Value   Glucose, Bld 153 (*)    BUN 27 (*)    Creatinine, Ser 1.77 (*)    Calcium 8.7 (*)    GFR,  Estimated 41 (*)    All other components within normal limits  BRAIN NATRIURETIC PEPTIDE - Abnormal; Notable for the following components:   B Natriuretic Peptide >4,500.0 (*)    All other components within normal limits  TROPONIN I (HIGH SENSITIVITY) - Abnormal; Notable for the following components:   Troponin I (High Sensitivity) 89 (*)    All  other components within normal limits  TROPONIN I (HIGH SENSITIVITY) - Abnormal; Notable for the following components:   Troponin I (High Sensitivity) 93 (*)    All other components within normal limits  CBC  PROTIME-INR                                                                                                                          Radiology CT Angio Chest PE W and/or Wo Contrast  Result Date: 07/28/2022 CLINICAL DATA:  Shortness of breath. EXAM: CT ANGIOGRAPHY CHEST WITH CONTRAST TECHNIQUE: Multidetector CT imaging of the chest was performed using the standard protocol during bolus administration of intravenous contrast. Multiplanar CT image reconstructions and MIPs were obtained to evaluate the vascular anatomy. RADIATION DOSE REDUCTION: This exam was performed according to the departmental dose-optimization program which includes automated exposure control, adjustment of the mA and/or kV according to patient size and/or use of iterative reconstruction technique. CONTRAST:  75mL OMNIPAQUE IOHEXOL 350 MG/ML SOLN COMPARISON:  April 25, 2022 FINDINGS: Cardiovascular: There is marked severity calcification of the thoracic aorta, without evidence of aortic aneurysm. Satisfactory opacification of the pulmonary arteries to the segmental level. No evidence of pulmonary embolism. There is mild cardiomegaly, with marked severity coronary artery calcification. No pericardial effusion. Mediastinum/Nodes: No enlarged mediastinal, hilar, or axillary lymph nodes. Thyroid gland, trachea, and esophagus demonstrate no significant findings. Lungs/Pleura: There is moderate to marked severity lingular atelectasis and/or infiltrate. Mild to moderate severity anteromedial right middle lobe and anterior right lower lobe scarring and/or atelectasis is also noted. These areas are new when compared to the prior study. The 4 mm posteromedial left lower lobe lung nodule seen on the prior study is not clearly identified on  the current exam. A trace amount of pleural fluid is noted, bilaterally. No pneumothorax is identified. Upper Abdomen: Stable 7 mm and 14 mm foci of parenchymal low attenuation are seen within the anterior aspect of the right lobe of the liver. Multiple small parenchymal calcifications are seen within the visualized portion of body of the pancreas. Pancreatic ductal dilatation is also noted (approximately 8.2 cm in diameter). Musculoskeletal: Chronic right-sided rib fractures are seen. No acute osseous abnormalities are identified. Review of the MIP images confirms the above findings. IMPRESSION: 1. No evidence of pulmonary embolism. 2. Moderate to marked severity lingular atelectasis and/or infiltrate. 3. Mild to moderate severity anteromedial right middle lobe and anterior right lower lobe scarring and/or atelectasis. 4. Trace amount of pleural fluid, bilaterally. 5. Stable hepatic cysts. 6. Findings consistent with chronic pancreatitis. 7. Aortic atherosclerosis. Aortic Atherosclerosis (ICD10-I70.0). Electronically Signed   By:  Aram Candela M.D.   On: 07/28/2022 22:27   DG Chest 2 View  Result Date: 07/28/2022 CLINICAL DATA:  SOB EXAM: CHEST - 2 VIEW COMPARISON:  Chest x-ray 07/24/2022 FINDINGS: Heart is prominent in size. The heart and mediastinal contours are unchanged. Aortic calcification. Right lower lung zone airspace opacity. No focal consolidation. No pulmonary edema. No pleural effusion. No pneumothorax. No acute osseous abnormality. IMPRESSION: 1. Right lower lung zone airspace opacity. Followup PA and lateral chest X-ray is recommended in 3-4 weeks following therapy to ensure resolution and exclude underlying malignancy. 2.  Aortic Atherosclerosis (ICD10-I70.0). Electronically Signed   By: Tish Frederickson M.D.   On: 07/28/2022 18:09    Pertinent labs & imaging results that were available during my care of the patient were reviewed by me and considered in my medical decision making (see MDM  for details).  Medications Ordered in ED Medications  iohexol (OMNIPAQUE) 350 MG/ML injection 75 mL (75 mLs Intravenous Contrast Given 07/28/22 2200)  furosemide (LASIX) injection 40 mg (40 mg Intravenous Given 07/28/22 2235)  cefTRIAXone (ROCEPHIN) 2 g in sodium chloride 0.9 % 100 mL IVPB (2 g Intravenous New Bag/Given 07/28/22 2346)  azithromycin (ZITHROMAX) 500 mg in sodium chloride 0.9 % 250 mL IVPB (500 mg Intravenous New Bag/Given 07/28/22 2350)                                                                                                                                     Procedures Procedures  (including critical care time)  Medical Decision Making / ED Course   This patient presents to the ED for concern of shortness of breath, this involves an extensive number of treatment options, and is a complaint that carries with it a high risk of complications and morbidity.  The differential diagnosis includes Pe, PTX, Pulmonary Edema, ARDS, COPD/Asthma, ACS, CHF exacerbation, Arrhythmia, Pericardial Effusion/Tamponade, Anemia, Sepsis, Acidosis/Hypercapnia, Anxiety, Viral URI  MDM: Patient seen emergency room for evaluation of shortness of breath.  Physical exam with rales at the bases but is otherwise unremarkable.  Laboratory evaluation with a BUN of 27, creatinine 1.77, BNP significantly elevated from discharge greater than 4500, high-sensitivity troponin 89 and delta troponin fairly flat at 93 likely secondary to demand ischemia.  Chest x-ray with a right lower lobe airspace opacity.  Given persistent shortness of breath and recent hospital admission with immobilization, PE study obtained that was reassuringly negative for PE but does show a lingular infiltrate versus atelectasis, right middle lobe and anterior lower lobe scarring/atelectasis.  Patient started on ceftriaxone azithromycin and diuresis and will require readmission to the hospital for persistent shortness of breath and rising BNP  in setting of a suspected CHF exacerbation.   Additional history obtained:  -External records from outside source obtained and reviewed including: Chart review including previous notes, labs, imaging, consultation notes   Lab Tests: -I ordered, reviewed, and interpreted labs.   The  pertinent results include:   Labs Reviewed  BASIC METABOLIC PANEL - Abnormal; Notable for the following components:      Result Value   Glucose, Bld 153 (*)    BUN 27 (*)    Creatinine, Ser 1.77 (*)    Calcium 8.7 (*)    GFR, Estimated 41 (*)    All other components within normal limits  BRAIN NATRIURETIC PEPTIDE - Abnormal; Notable for the following components:   B Natriuretic Peptide >4,500.0 (*)    All other components within normal limits  TROPONIN I (HIGH SENSITIVITY) - Abnormal; Notable for the following components:   Troponin I (High Sensitivity) 89 (*)    All other components within normal limits  TROPONIN I (HIGH SENSITIVITY) - Abnormal; Notable for the following components:   Troponin I (High Sensitivity) 93 (*)    All other components within normal limits  CBC  PROTIME-INR      EKG   EKG Interpretation Date/Time:  Thursday July 28 2022 17:33:39 EDT Ventricular Rate:  73 PR Interval:  258 QRS Duration:  124 QT Interval:  434 QTC Calculation: 478 R Axis:   72  Text Interpretation: Normal sinus rhythm with PVCs Left ventricular hypertrophy with QRS widening and repolarization abnormality ( Cornell product ) Cannot rule out Septal infarct , age undetermined Abnormal ECG When compared with ECG of 24-Jul-2022 11:40, PREVIOUS ECG IS PRESENT Confirmed by Laria Grimmett ([id number]) on 07/29/2022 2:38:35 AM         Imaging Studies ordered: I ordered imaging studies including chest x-ray, CT PE I independently visualized and interpreted imaging. I agree with the radiologist interpretation   Medicines ordered and prescription drug management: Meds ordered this encounter  Medications    iohexol (OMNIPAQUE) 350 MG/ML injection 75 mL   furosemide (LASIX) injection 40 mg   cefTRIAXone (ROCEPHIN) 2 g in sodium chloride 0.9 % 100 mL IVPB    Order Specific Question:   Antibiotic Indication:    Answer:   CAP   azithromycin (ZITHROMAX) 500 mg in sodium chloride 0.9 % 250 mL IVPB    Order Specific Question:   Antibiotic Indication:    Answer:   CAP    -I have reviewed the patients home medicines and have made adjustments as needed  Critical interventions none    Cardiac Monitoring: The patient was maintained on a cardiac monitor.  I personally viewed and interpreted the cardiac monitored which showed an underlying rhythm of: NSR  Social Determinants of Health:  Factors impacting patients care include: none   Reevaluation: After the interventions noted above, I reevaluated the patient and found that they have :improved  Co morbidities that complicate the patient evaluation  Past Medical History:  Diagnosis Date   Aortic atherosclerosis (HCC) 08/03/2021   CAD (coronary artery disease) 08/03/2021   Cardiac catheterization September 2023 with RCA/RV marginal stenosis managed medically   Cardiomyopathy (HCC)    Carotid artery disease (HCC)    CKD (chronic kidney disease) stage 3, GFR 30-59 ml/min (HCC)    COPD (chronic obstructive pulmonary disease) (HCC)    Essential hypertension    Head and neck cancer (HCC) 2019   Right facial basal cell carcinoma s/p resection with right partial mastectomy and partal rhinectomy with skin graft (06/13/17)   Iron deficiency anemia    Urinary retention       Dispostion: I considered admission for this patient, and due to persistent shortness of breath with new pneumonia CHF exacerbation patient require hospital admission  Final Clinical Impression(s) / ED Diagnoses Final diagnoses:  Acute congestive heart failure, unspecified heart failure type (HCC)  Pneumonia due to infectious organism, unspecified laterality,  unspecified part of lung     @PCDICTATION @    Glendora Score, MD 07/29/22 [phone]

## 2022-07-29 NOTE — Progress Notes (Signed)
   07/29/22 1027  ReDS Vest / Clip  Station Marker C  Ruler Value 30  ReDS Value Range 36 - 40  ReDS Actual Value 39

## 2022-07-29 NOTE — Hospital Course (Signed)
67 year old male with a history of HFrEF, CKD stage III, coronary disease, COPD, tobacco abuse, basal cell carcinoma status post partial rhinectomy and maxillectomy 06/06/2017 at [facility], advanced colonic tubular adenoma, right carotid stenosis, hypertension, presenting with shortness of breath since he was discharged from the hospital on 07/26/2022. As noted, the patient has numerous hospitalizations, most recently from 07/24/2022 to 07/26/2022 during which she was treated for acute on chronic HFrEF.  He was discharged with furosemide 40 mg once daily.  His Coreg was increased to 6.25 mg twice daily.  His discharge weight was 64.6 kg.  Prior to that, he had another hospital admission 05/05/2022 to 05/06/2022 again for acute on chronic HFrEF and COPD exacerbation.  His discharge weight was 67.5 kg.  Other hospitalizations in 2024 as below: Admitted to the hospital from 04/29/2022 to 05/03/2022 for syncope and COPD exacerbation. He was discharged with prednisone taper, albuterol. It was felt his syncope was related to orthostasis. His Entresto and amlodipine were discontinued during that hospitalization. Prior to that, the patient was also hospitalized from 04/25/2022 to 04/27/2022 due to elevated troponin. This was thought to be related to demand ischemia. He also had some asymptomatic NSVT.     His medical treatment has been difficult secondary to his social situation and current homelessness, poor compliance, and poor insight. He presented again on the evening of 07/28/2022 secondary to shortness of breath and dyspnea on exertion.  He denies any fevers, chills, chest pain, nausea, vomiting, direct abdominal pain.  In the ED, the patient was afebrile and hemodynamically stable with oxygen saturation 100% room air.  WBC 8.3, hemoglobin 13.0, platelets 349,000.  Sodium 139, potassium 4.0, bicarbonate 29, serum creatinine 1.77.  CTA chest was negative for PE but showed moderate to severe lingular atelectasis and/or  infiltrate.  There is trace bilateral pleural effusions.  Chest x-ray showed right lower lobe opacity.  The patient was given IV furosemide and admitted for further evaluation and treatment.

## 2022-07-29 NOTE — Consult Note (Signed)
CARDIOLOGY CONSULT NOTE    Patient ID: Michael [name]; [id number]; [birth year]   Admit date: 07/28/2022 Date of Consult: 07/29/2022  Primary Care Provider: Benita Stabile, MD Primary Cardiologist:  Primary Electrophysiologist:     History of Present Illness:   [name] is a 67 year old M known to have NICM LVEF 35 to 40%, mild to moderate CAD, HTN, HLD, stage III CKD, COPD was sent from cardiology clinic for worsening DOE and is currently admitted to hospitalist for management of acute on chronic systolic heart failure exacerbation.  He was recently discharged for same complaint.  Patient reported fluid noncompliance after discharge from hospital couple of days ago.  BNP is more than 4500.  Troponins are mildly elevated, 89, 93.  Serum creatinine 1.77.  Past Medical History:  Diagnosis Date   Aortic atherosclerosis (HCC) 08/03/2021   CAD (coronary artery disease) 08/03/2021   Cardiac catheterization September 2023 with RCA/RV marginal stenosis managed medically   Cardiomyopathy (HCC)    Carotid artery disease (HCC)    CKD (chronic kidney disease) stage 3, GFR 30-59 ml/min (HCC)    COPD (chronic obstructive pulmonary disease) (HCC)    Essential hypertension    Head and neck cancer (HCC) 2019   Right facial basal cell carcinoma s/p resection with right partial mastectomy and partal rhinectomy with skin graft (06/13/17)   Iron deficiency anemia    Urinary retention     Past Surgical History:  Procedure Laterality Date   ANKLE CLOSED REDUCTION Right    open reduction   BASAL CELL CARCINOMA EXCISION  2019   at [facility]   BIOPSY  07/19/2021   Procedure: BIOPSY;  Surgeon: Lanelle Bal, DO;  Location: [facility];  Service: Endoscopy;;   COLONOSCOPY N/A 05/31/2017   Procedure: COLONOSCOPY;  Surgeon: Corbin Ade, MD;  Location: [facility];  Service: Endoscopy;  Laterality: N/A;  2:45pm   COLONOSCOPY WITH PROPOFOL N/A 07/19/2021   Procedure: COLONOSCOPY WITH PROPOFOL;   Surgeon: Lanelle Bal, DO;  Location: [facility];  Service: Endoscopy;  Laterality: N/A;  3:00pm, moved up to 9:00   CYSTOSCOPY N/A 10/29/2018   Procedure: CYSTOSCOPY, CLOT EVACUATION;  Surgeon: Rene Paci, MD;  Location: [facility];  Service: Urology;  Laterality: N/A;   PARTIAL COLECTOMY Right 08/11/2021   Procedure: PARTIAL COLECTOMY, OPEN RIGHT HEMICOLECTOMY;  Surgeon: Lucretia Roers, MD;  Location: [facility];  Service: General;  Laterality: Right;   POLYPECTOMY  05/31/2017   Procedure: POLYPECTOMY;  Surgeon: Corbin Ade, MD;  [facility]: [facility];  Service: Endoscopy;;   RIGHT/LEFT HEART CATH AND CORONARY ANGIOGRAPHY N/A 10/22/2021   Procedure: RIGHT/LEFT HEART CATH AND CORONARY ANGIOGRAPHY;  Surgeon: Orbie Pyo, MD;  Location: [facility];  Service: Cardiovascular;  Laterality: N/A;   TONSILLECTOMY     TRANSCAROTID ARTERY REVASCULARIZATION  Right 02/15/2021   Procedure: RIGHT TRANSCAROTID ARTERY REVASCULARIZATION;  Surgeon: Cephus Shelling, MD;  Location: [facility];  Service: Vascular;  Laterality: Right;   ULTRASOUND GUIDANCE FOR VASCULAR ACCESS Left 02/15/2021   Procedure: ULTRASOUND GUIDANCE FOR VASCULAR ACCESS;  Surgeon: Cephus Shelling, MD;  [facility];  Service: Vascular;  Laterality: Left;   XI ROBOTIC ASSISTED SIMPLE PROSTATECTOMY N/A 10/29/2018   Procedure: XI ROBOTIC ASSISTED SIMPLE PROSTATECTOMY;  Surgeon: Malen Gauze, MD;  Location: [facility];  Service: Urology;  Laterality: N/A;       Inpatient Medications: Scheduled Meds:  carvedilol  6.25 mg Oral BID WC  clopidogrel  75 mg Oral Daily   dextromethorphan-guaiFENesin  1 tablet Oral BID   enoxaparin (LOVENOX) injection  40 mg Subcutaneous Q24H   furosemide  40 mg Intravenous BID   isosorbide mononitrate  60 mg Oral QPM   umeclidinium bromide  1 puff Inhalation Daily   And   mometasone-formoterol  2 puff Inhalation BID   Continuous Infusions:  azithromycin      cefTRIAXone (ROCEPHIN)  IV     PRN Meds: acetaminophen **OR** acetaminophen, albuterol  Allergies:   No Known Allergies  Social History:   Social History   Socioeconomic History   Marital status: Single    Spouse name: Not on file   Number of children: Not on file   Years of education: Not on file   Highest education level: Not on file  Occupational History   Not on file  Tobacco Use   Smoking status: Former    Packs/day: 0.50    Years: 40.00    Additional pack years: 0.00    Total pack years: 20.00    Types: Cigarettes    Quit date: 07/02/2021    Years since quitting: 1.0    Passive exposure: Never   Smokeless tobacco: Never   Tobacco comments:    Quit on 06/09/21  Vaping Use   Vaping Use: Never used  Substance and Sexual Activity   Alcohol use: Not Currently   Drug use: Never   Sexual activity: Not Currently  Other Topics Concern   Not on file  Social History Narrative   Not on file   Social Determinants of Health   Financial Resource Strain: Low Risk  (12/11/2019)   Overall Financial Resource Strain (CARDIA)    Difficulty of Paying Living Expenses: Not hard at all  Food Insecurity: No Food Insecurity (07/29/2022)   Hunger Vital Sign    Worried About Running Out of Food in the Last Year: Never true    Ran Out of Food in the Last Year: Never true  Transportation Needs: No Transportation Needs (07/29/2022)   PRAPARE - Administrator, Civil Service (Medical): No    Lack of Transportation (Non-Medical): No  Recent Concern: Transportation Needs - Unmet Transportation Needs (05/05/2022)   PRAPARE - Transportation    Lack of Transportation (Medical): Yes    Lack of Transportation (Non-Medical): Yes  Physical Activity: Inactive (12/11/2019)   Exercise Vital Sign    Days of Exercise per Week: 0 days    Minutes of Exercise per Session: 0 min  Stress: No Stress Concern Present (12/11/2019)   [employer] of Occupational Health - Occupational Stress  Questionnaire    Feeling of Stress : Not at all  Social Connections: Socially Isolated (12/11/2019)   Social Connection and Isolation Panel [NHANES]    Frequency of Communication with Friends and Family: Never    Frequency of Social Gatherings with Friends and Family: Never    Attends Religious Services: Never    Database administrator or Organizations: No    Attends Banker Meetings: Never    Marital Status: Divorced  Catering manager Violence: Not At Risk (07/29/2022)   Humiliation, Afraid, Rape, and Kick questionnaire    Fear of Current or Ex-Partner: No    Emotionally Abused: No    Physically Abused: No    Sexually Abused: No    Family History:    Family History  Problem Relation Age of Onset   Stroke Father    Cirrhosis Mother  Colon cancer Neg Hx      ROS:  Please see the history of present illness.  ROS  All other ROS reviewed and negative.     Physical Exam/Data:   Vitals:   07/28/22 2233 07/28/22 2338 07/29/22 0325 07/29/22 0846  BP: (!) 167/74 (!) 173/90 (!) 147/95 (!) 159/96  Pulse: 84 91 80 76  Resp: (!) 22 20 20    Temp:  97.8 F (36.6 C) (!) 97.5 F (36.4 C) 97.8 F (36.6 C)  TempSrc:  Oral Oral Oral  SpO2: 95% 100% 100% 98%  Weight:  65.2 kg    Height:  6' (1.829 m)     No intake or output data in the 24 hours ending 07/29/22 1207 Filed Weights   07/28/22 1725 07/28/22 2338  Weight: 67.1 kg 65.2 kg   Body mass index is 19.49 kg/m.  General:  Well nourished, well developed, in acute distress HEENT: normal Lymph: no adenopathy Neck: JVD not examined due to patient in resp distress Endocrine:  No thryomegaly Vascular: No carotid bruits; FA pulses 2+ bilaterally without bruits  Cardiac:  normal S1, S2; RRR; no murmur  Lungs:  clear to auscultation bilaterally, no wheezing, rhonchi or rales  Abd: soft, nontender, no hepatomegaly  Ext: no edema Musculoskeletal:  No deformities, BUE and BLE strength normal and equal Skin: warm and  dry  Neuro:  CNs 2-12 intact, no focal abnormalities noted Psych:  Normal affect   EKG:  The EKG was personally reviewed and demonstrates:   Telemetry:  Telemetry was personally reviewed and demonstrates:    Relevant CV Studies:   Laboratory Data:  Chemistry Recent Labs  Lab 07/25/22 0337 07/26/22 0442 07/26/22 1458 07/28/22 1800  NA 134* 139  --  139  K 2.8* 2.6* 3.3* 4.0  CL 104 101  --  101  CO2 21* 27  --  29  GLUCOSE 133* 118*  --  153*  BUN 42* 32*  --  27*  CREATININE 1.96* 1.72*  --  1.77*  CALCIUM 7.4* 7.7*  --  8.7*  GFRNONAA 37* 43*  --  41*  ANIONGAP 9 11  --  9    Recent Labs  Lab 07/24/22 1210 07/25/22 0337 07/26/22 0442  PROT 6.3* 5.5* 6.1*  ALBUMIN 3.0* 2.6* 2.7*  AST 56* 35 38  ALT 216* 136* 117*  ALKPHOS 105 86 97  BILITOT 2.8* 2.0* 1.6*   Hematology Recent Labs  Lab 07/25/22 0337 07/26/22 0442 07/28/22 1800  WBC 13.6* 9.7 8.3  RBC 3.80* 3.98* 4.54  HGB 10.9* 11.4* 13.0  HCT 34.7* 36.3* 42.6  MCV 91.3 91.2 93.8  MCH 28.7 28.6 28.6  MCHC 31.4 31.4 30.5  RDW 14.9 14.8 14.9  PLT 257 276 349   Cardiac EnzymesNo results for input(s): "TROPONINI" in the last 168 hours. No results for input(s): "TROPIPOC" in the last 168 hours.  BNP Recent Labs  Lab 07/25/22 0337 07/26/22 0442 07/28/22 1800  BNP >4,500.0* 3,047.0* >4,500.0*    DDimer No results for input(s): "DDIMER" in the last 168 hours.  Radiology/Studies:  CT Angio Chest PE W and/or Wo Contrast  Result Date: 07/28/2022 CLINICAL DATA:  Shortness of breath. EXAM: CT ANGIOGRAPHY CHEST WITH CONTRAST TECHNIQUE: Multidetector CT imaging of the chest was performed using the standard protocol during bolus administration of intravenous contrast. Multiplanar CT image reconstructions and MIPs were obtained to evaluate the vascular anatomy. RADIATION DOSE REDUCTION: This exam was performed according to the departmental dose-optimization program which includes  automated exposure control,  adjustment of the mA and/or kV according to patient size and/or use of iterative reconstruction technique. CONTRAST:  75mL OMNIPAQUE IOHEXOL 350 MG/ML SOLN COMPARISON:  April 25, 2022 FINDINGS: Cardiovascular: There is marked severity calcification of the thoracic aorta, without evidence of aortic aneurysm. Satisfactory opacification of the pulmonary arteries to the segmental level. No evidence of pulmonary embolism. There is mild cardiomegaly, with marked severity coronary artery calcification. No pericardial effusion. Mediastinum/Nodes: No enlarged mediastinal, hilar, or axillary lymph nodes. Thyroid gland, trachea, and esophagus demonstrate no significant findings. Lungs/Pleura: There is moderate to marked severity lingular atelectasis and/or infiltrate. Mild to moderate severity anteromedial right middle lobe and anterior right lower lobe scarring and/or atelectasis is also noted. These areas are new when compared to the prior study. The 4 mm posteromedial left lower lobe lung nodule seen on the prior study is not clearly identified on the current exam. A trace amount of pleural fluid is noted, bilaterally. No pneumothorax is identified. Upper Abdomen: Stable 7 mm and 14 mm foci of parenchymal low attenuation are seen within the anterior aspect of the right lobe of the liver. Multiple small parenchymal calcifications are seen within the visualized portion of body of the pancreas. Pancreatic ductal dilatation is also noted (approximately 8.2 cm in diameter). Musculoskeletal: Chronic right-sided rib fractures are seen. No acute osseous abnormalities are identified. Review of the MIP images confirms the above findings. IMPRESSION: 1. No evidence of pulmonary embolism. 2. Moderate to marked severity lingular atelectasis and/or infiltrate. 3. Mild to moderate severity anteromedial right middle lobe and anterior right lower lobe scarring and/or atelectasis. 4. Trace amount of pleural fluid, bilaterally. 5. Stable  hepatic cysts. 6. Findings consistent with chronic pancreatitis. 7. Aortic atherosclerosis. Aortic Atherosclerosis (ICD10-I70.0). Electronically Signed   By: Aram Candela M.D.   On: 07/28/2022 22:27   DG Chest 2 View  Result Date: 07/28/2022 CLINICAL DATA:  SOB EXAM: CHEST - 2 VIEW COMPARISON:  Chest x-ray 07/24/2022 FINDINGS: Heart is prominent in size. The heart and mediastinal contours are unchanged. Aortic calcification. Right lower lung zone airspace opacity. No focal consolidation. No pulmonary edema. No pleural effusion. No pneumothorax. No acute osseous abnormality. IMPRESSION: 1. Right lower lung zone airspace opacity. Followup PA and lateral chest X-ray is recommended in 3-4 weeks following therapy to ensure resolution and exclude underlying malignancy. 2.  Aortic Atherosclerosis (ICD10-I70.0). Electronically Signed   By: Tish Frederickson M.D.   On: 07/28/2022 18:09    Assessment and Plan:    # Acute on chronic systolic heart failure exacerbation # Mildly elevated troponins likely secondary to volume overload -Worsening DOE related to fluid noncompliance. BNP more than 4500. ReDS Vest 39. Increase IV Lasix from 40 mg to 80 mg twice daily and increase carvedilol from 6.25 mg to 12.5 mg twice daily. -Daily weights, ins and outs and 1.2 L fluid restriction  # Mild to moderate nonobstructive CAD, on Plavix and Imdur, will continue.   For questions or updates, please contact [facility] Please consult [email or web address] for contact info under Cardiology/STEMI.   Signed, Arsema Tusing P 07/29/2022 12:07 PM

## 2022-07-30 DIAGNOSIS — I10 Essential (primary) hypertension: Secondary | ICD-10-CM | POA: Diagnosis not present

## 2022-07-30 DIAGNOSIS — I5023 Acute on chronic systolic (congestive) heart failure: Secondary | ICD-10-CM | POA: Diagnosis not present

## 2022-07-30 DIAGNOSIS — N1832 Chronic kidney disease, stage 3b: Secondary | ICD-10-CM | POA: Diagnosis not present

## 2022-07-30 LAB — BASIC METABOLIC PANEL
Anion gap: 11 (ref 5–15)
BUN: 27 mg/dL — ABNORMAL HIGH (ref 8–23)
CO2: 29 mmol/L (ref 22–32)
Calcium: 8.4 mg/dL — ABNORMAL LOW (ref 8.9–10.3)
Chloride: 100 mmol/L (ref 98–111)
Creatinine, Ser: 1.84 mg/dL — ABNORMAL HIGH (ref 0.61–1.24)
GFR, Estimated: 39 mL/min — ABNORMAL LOW (ref >60)
Glucose, Bld: 140 mg/dL — ABNORMAL HIGH (ref 70–99)
Potassium: 3.6 mmol/L (ref 3.5–5.1)
Sodium: 140 mmol/L (ref 135–145)

## 2022-07-30 LAB — MAGNESIUM: Magnesium: 1.9 mg/dL (ref 1.7–2.4)

## 2022-07-30 LAB — ECHOCARDIOGRAM COMPLETE
AR max vel: 4.19 cm2
AV Area VTI: 4.2 cm2
AV Area mean vel: 3.68 cm2
AV Mean grad: 2 mmHg
AV Peak grad: 3.5 mmHg
Ao pk vel: 0.93 m/s
Area-P 1/2: 5.02 cm2
Height: 72 in
S' Lateral: 3.8 cm

## 2022-07-30 MED ORDER — ORAL CARE MOUTH RINSE: 15.0000 mL | OROMUCOSAL | Status: DC | PRN

## 2022-07-30 NOTE — Progress Notes (Signed)
Patient alert and verbal, tolerated medications whole. REDS Vest obtained this morning, see previous note. Patient continues on fluid restriction, patient continues to request fluids. Patient informed several times during shift regarding restriction. Patient refused bed alarm to be activated, patient educated.

## 2022-07-30 NOTE — Progress Notes (Signed)
PROGRESS NOTE  Michael [name] WUJ:[id number] DOB: [birth year] DOA: 07/28/2022 PCP: Benita Stabile, MD  Brief History:  67 year old male with a history of HFrEF, CKD stage III, coronary disease, COPD, tobacco abuse, basal cell carcinoma status post partial rhinectomy and maxillectomy 06/06/2017 at [facility], advanced colonic tubular adenoma, right carotid stenosis, hypertension, presenting with shortness of breath since he was discharged from the hospital on 07/26/2022. As noted, the patient has numerous hospitalizations, most recently from 07/24/2022 to 07/26/2022 during which she was treated for acute on chronic HFrEF.  He was discharged with furosemide 40 mg once daily.  His Coreg was increased to 6.25 mg twice daily.  His discharge weight was 64.6 kg.  Prior to that, he had another hospital admission 05/05/2022 to 05/06/2022 again for acute on chronic HFrEF and COPD exacerbation.  His discharge weight was 67.5 kg.  Other hospitalizations in 2024 as below: Admitted to the hospital from 04/29/2022 to 05/03/2022 for syncope and COPD exacerbation. He was discharged with prednisone taper, albuterol. It was felt his syncope was related to orthostasis. His Entresto and amlodipine were discontinued during that hospitalization. Prior to that, the patient was also hospitalized from 04/25/2022 to 04/27/2022 due to elevated troponin. This was thought to be related to demand ischemia. He also had some asymptomatic NSVT.     His medical treatment has been difficult secondary to his social situation and current homelessness, poor compliance, and poor insight. He presented again on the evening of 07/28/2022 secondary to shortness of breath and dyspnea on exertion.  He denies any fevers, chills, chest pain, nausea, vomiting, direct abdominal pain.  In the ED, the patient was afebrile and hemodynamically stable with oxygen saturation 100% room air.  WBC 8.3, hemoglobin 13.0, platelets 349,000.  Sodium 139, potassium 4.0,  bicarbonate 29, serum creatinine 1.77.  CTA chest was negative for PE but showed moderate to severe lingular atelectasis and/or infiltrate.  There is trace bilateral pleural effusions.  Chest x-ray showed right lower lobe opacity.  The patient was given IV furosemide and admitted for further evaluation and treatment.   Assessment/Plan:  Dyspnea -Multifactorial including COPD HFrEF, and deconditioning -6/27 CTA chest--no PE, trace bilateral pleural eff -Started DuoNebs -Started Pulmicort -started brovana -PCT 0.23 -d/c abx   Elevated troponin -No chest pain presently -Secondary to demand ischemia -Cardiology consult appreciated--no further inpt workup -04/03/2022 echo EF 35-40%, global HK, normal PASP, mild TR   Acute on Chronic HFrEF -Entresto was discontinued during his Early April 2024 hospitalization -04/30/2022 echo EF 35-40%, global HK, normal PASP, mild TR -Resume carvedilol--increased to 12.5 mg bid -ReDS = 39 -increased lasix to 80 IV bid>>continue -weight 143>>135 lbs   CKD stage IIIb -Baseline creatinine 1.7-2.0   Essential hypertension -continue coreg   Tobacco abuse -Tobacco cessation discussed -pt now states he quit one month ago   Right Carotid stenosis -The patient has follow-up with Dr. Leonette Most fields -05/12/2017 carotid ultrasound shows 80-99% right carotid stenosis -70% heavily calcified RICA stenosis and no significant left-sided disease by CT angiography.  He is now following with Dr. Olga Coaster was not felt to be a candidate for carotid stenting and suboptimal candidate for endarterectomy. Plan is for medical therapy at this point -continue plavix   Basal cell carcinoma of nasolabial fold -06/06/2017--right partial rhinectomy, resection of left intranasal lesion, skin graft @[facility] -Margins were clear on pathology -Most recent follow-up was 06/23/2017   Iron deficiency anemia/Advanced colonic adenoma -The patient had colonoscopy  05/31/2017--DrJena Gauss -He  was noted to have numerous polyps as well as a polypoid mass in the hepatic flexure to the cecum area -pathology of mass--tubular adenoma -He was referred to general surgery, Dr. Larae Grooms for hemicolectomy -He has been lost to follow-up -noncompliant with po iron       Family Communication:  no  Family at bedside   Consultants:  cardiology   Code Status:  FULL    DVT Prophylaxis: Vandiver Lovenox     Procedures: As Listed in Progress Note Above   Antibiotics: Ceftriaxone 6/27 Azithro 6/27        Subjective: Patient denies fevers, chills, headache, chest pain, dyspnea, nausea, vomiting, diarrhea, abdominal pain, dysuria, hematuria, hematochezia, and melena.   Objective: Vitals:   07/30/22 0815 07/30/22 0853 07/30/22 1226 07/30/22 1712  BP:  139/79 123/75 (!) 145/93  Pulse:  65 (!) 59 69  Resp:  17 18   Temp:  98.3 F (36.8 C) (!) 97.4 F (36.3 C)   TempSrc:  Oral Oral   SpO2: 95% 100% 100%   Weight:      Height:        Intake/Output Summary (Last 24 hours) at 07/30/2022 1822 Last data filed at 07/30/2022 1500 Gross per 24 hour  Intake 720 ml  Output --  Net 720 ml   Weight change:  Exam:  General:  Pt is alert, follows commands appropriately, not in acute distress HEENT: No icterus, No thrush, No neck mass, [place]/AT Cardiovascular: RRR, S1/S2, no rubs, no gallops Respiratory: bibasilar crackles. No wheeze Abdomen: Soft/+BS, non tender, non distended, no guarding Extremities: No edema, No lymphangitis, No petechiae, No rashes, no synovitis   Data Reviewed: I have personally reviewed following labs and imaging studies Basic Metabolic Panel: Recent Labs  Lab 07/24/22 1130 07/24/22 1210 07/24/22 1210 07/25/22 0337 07/26/22 0442 07/26/22 1458 07/28/22 1800 07/28/22 2228 07/30/22 0518  NA  --  138  --  134* 139  --  139  --  140  K  --  4.2   < > 2.8* 2.6* 3.3* 4.0  --  3.6  CL  --  106  --  104 101  --  101  --  100  CO2  --  21*  --  21* 27   --  29  --  29  GLUCOSE  --  148*  --  133* 118*  --  153*  --  140*  BUN  --  44*  --  42* 32*  --  27*  --  27*  CREATININE  --  2.33*  --  1.96* 1.72*  --  1.77*  --  1.84*  CALCIUM  --  8.2*  --  7.4* 7.7*  --  8.7*  --  8.4*  MG 2.0  --   --   --   --   --   --  1.9 1.9  PHOS 3.5  --   --   --   --   --   --  3.4  --    < > = values in this interval not displayed.   Liver Function Tests: Recent Labs  Lab 07/24/22 1210 07/25/22 0337 07/26/22 0442  AST 56* 35 38  ALT 216* 136* 117*  ALKPHOS 105 86 97  BILITOT 2.8* 2.0* 1.6*  PROT 6.3* 5.5* 6.1*  ALBUMIN 3.0* 2.6* 2.7*   No results for input(s): "LIPASE", "AMYLASE" in the last 168 hours. No results for input(s): "AMMONIA" in  the last 168 hours. Coagulation Profile: Recent Labs  Lab 07/24/22 1130 07/25/22 0337 07/28/22 1800  INR 1.5* 1.6* 1.1   CBC: Recent Labs  Lab 07/24/22 1130 07/25/22 0337 07/26/22 0442 07/28/22 1800  WBC 12.5* 13.6* 9.7 8.3  NEUTROABS 9.8*  --   --   --   HGB 13.1 10.9* 11.4* 13.0  HCT 41.7 34.7* 36.3* 42.6  MCV 91.6 91.3 91.2 93.8  PLT 270 257 276 349   Cardiac Enzymes: No results for input(s): "CKTOTAL", "CKMB", "CKMBINDEX", "TROPONINI" in the last 168 hours. BNP: Invalid input(s): "POCBNP" CBG: Recent Labs  Lab 07/25/22 0854 07/26/22 0711  GLUCAP 135* 118*   HbA1C: No results for input(s): "HGBA1C" in the last 72 hours. Urine analysis:    Component Value Date/Time   COLORURINE COLORLESS (A) 05/05/2022 0830   APPEARANCEUR CLEAR 05/05/2022 0830   APPEARANCEUR Cloudy (A) 06/16/2021 0936   LABSPEC 1.005 05/05/2022 0830   LABSPEC 1.018 04/23/2011 1044   PHURINE 5.0 05/05/2022 0830   GLUCOSEU NEGATIVE 05/05/2022 0830   GLUCOSEU Negative 04/23/2011 1044   HGBUR NEGATIVE 05/05/2022 0830   BILIRUBINUR NEGATIVE 05/05/2022 0830   BILIRUBINUR Negative 06/16/2021 0936   BILIRUBINUR Negative 04/23/2011 1044   KETONESUR NEGATIVE 05/05/2022 0830   PROTEINUR NEGATIVE 05/05/2022 0830    NITRITE NEGATIVE 05/05/2022 0830   LEUKOCYTESUR SMALL (A) 05/05/2022 0830   LEUKOCYTESUR Trace 04/23/2011 1044   Sepsis Labs: @LABRCNTIP (procalcitonin:4,lacticidven:4) ) Recent Results (from the past 240 hour(s))  Resp panel by RT-PCR (RSV, Flu A&B, Covid) Anterior Nasal Swab     Status: None   Collection Time: 07/24/22 11:47 AM   Specimen: Anterior Nasal Swab  Result Value Ref Range Status   SARS Coronavirus 2 by RT PCR NEGATIVE NEGATIVE Final    Comment: (NOTE) SARS-CoV-2 target nucleic acids are NOT DETECTED.  The SARS-CoV-2 RNA is generally detectable in upper respiratory specimens during the acute phase of infection. The lowest concentration of SARS-CoV-2 viral copies this assay can detect is 138 copies/mL. A negative result does not preclude SARS-Cov-2 infection and should not be used as the sole basis for treatment or other patient management decisions. A negative result may occur with  improper specimen collection/handling, submission of specimen other than nasopharyngeal swab, presence of viral mutation(s) within the areas targeted by this assay, and inadequate number of viral copies(<138 copies/mL). A negative result must be combined with clinical observations, patient history, and epidemiological information. The expected result is Negative.  Fact Sheet for Patients:  BloggerCourse.com  Fact Sheet for Healthcare Providers:  SeriousBroker.it  This test is no t yet approved or cleared by the [place] FDA and  has been authorized for detection and/or diagnosis of SARS-CoV-2 by FDA under an Emergency Use Authorization (EUA). This EUA will remain  in effect (meaning this test can be used) for the duration of the COVID-19 declaration under Section 564(b)(1) of the Act, 21 U.S.C.section 360bbb-3(b)(1), unless the authorization is terminated  or revoked sooner.       Influenza A by PCR NEGATIVE NEGATIVE Final    Influenza B by PCR NEGATIVE NEGATIVE Final    Comment: (NOTE) The Xpert Xpress SARS-CoV-2/FLU/RSV plus assay is intended as an aid in the diagnosis of influenza from Nasopharyngeal swab specimens and should not be used as a sole basis for treatment. Nasal washings and aspirates are unacceptable for Xpert Xpress SARS-CoV-2/FLU/RSV testing.  Fact Sheet for Patients: BloggerCourse.com  Fact Sheet for Healthcare Providers: SeriousBroker.it  This test is not yet approved or cleared  by the [place] and has been authorized for detection and/or diagnosis of SARS-CoV-2 by FDA under an Emergency Use Authorization (EUA). This EUA will remain in effect (meaning this test can be used) for the duration of the COVID-19 declaration under Section 564(b)(1) of the Act, 21 U.S.C. section 360bbb-3(b)(1), unless the authorization is terminated or revoked.     Resp Syncytial Virus by PCR NEGATIVE NEGATIVE Final    Comment: (NOTE) Fact Sheet for Patients: BloggerCourse.com  Fact Sheet for Healthcare Providers: SeriousBroker.it  This test is not yet approved or cleared by the [place] FDA and has been authorized for detection and/or diagnosis of SARS-CoV-2 by FDA under an Emergency Use Authorization (EUA). This EUA will remain in effect (meaning this test can be used) for the duration of the COVID-19 declaration under Section 564(b)(1) of the Act, 21 U.S.C. section 360bbb-3(b)(1), unless the authorization is terminated or revoked.  Performed at [facility], [address] [place], [place] [address]   MRSA Next Gen by PCR, Nasal     Status: None   Collection Time: 07/24/22  7:30 PM   Specimen: Nasal Mucosa; Nasal Swab  Result Value Ref Range Status   MRSA by PCR Next Gen NOT DETECTED NOT DETECTED Final    Comment: (NOTE) The GeneXpert MRSA Assay (FDA approved for NASAL specimens  only), is one component of a comprehensive MRSA colonization surveillance program. It is not intended to diagnose MRSA infection nor to guide or monitor treatment for MRSA infections. Test performance is not FDA approved in patients less than 69 years old. Performed at [facility], [address], [place], [place] [address]   Culture, blood (Routine X 2) w Reflex to ID Panel     Status: None (Preliminary result)   Collection Time: 07/29/22  8:55 AM   Specimen: Left Antecubital; Blood  Result Value Ref Range Status   Specimen Description   Final    LEFT ANTECUBITAL BOTTLES DRAWN AEROBIC AND ANAEROBIC   Special Requests Blood Culture adequate volume  Final   Culture   Final    NO GROWTH < 24 HOURS Performed at [facility], [address] [place], [place] [address]    Report Status PENDING  Incomplete  Culture, blood (Routine X 2) w Reflex to ID Panel     Status: None (Preliminary result)   Collection Time: 07/29/22  8:55 AM   Specimen: BLOOD LEFT HAND  Result Value Ref Range Status   Specimen Description   Final    BLOOD LEFT HAND BOTTLES DRAWN AEROBIC AND ANAEROBIC   Special Requests Blood Culture adequate volume  Final   Culture   Final    NO GROWTH < 24 HOURS Performed at [facility], [address] [place], [place] [address]    Report Status PENDING  Incomplete     Scheduled Meds:  arformoterol  15 mcg Nebulization BID   budesonide (PULMICORT) nebulizer solution  0.5 mg Nebulization BID   carvedilol  6.25 mg Oral BID WC   clopidogrel  75 mg Oral Daily   dextromethorphan-guaiFENesin  1 tablet Oral BID   enoxaparin (LOVENOX) injection  40 mg Subcutaneous Q24H   furosemide  80 mg Intravenous BID   isosorbide mononitrate  60 mg Oral QPM   potassium chloride  40 mEq Oral BID   Continuous Infusions:  Procedures/Studies: ECHOCARDIOGRAM COMPLETE  Result Date: 07/30/2022    ECHOCARDIOGRAM REPORT   Patient Name:   MARYANN CEBALLO Date of Exam: 07/29/2022 Medical Rec #:  [id number]     Height:       72.0 in Accession #:    [id number]    Weight:       143.7 lb Date of Birth:  [birth year]     BSA:          1.852 m Patient Age:    67 years      BP:           124/70 mmHg Patient Gender: M             HR:           63 bpm. [facility] Procedure: 2D Echo, 3D Echo, Cardiac Doppler and Color Doppler Indications:    I25.10 CAD  History:        Patient has prior history of Echocardiogram examinations, most                 recent 04/30/2022. Cardiomyopathy, CAD, COPD, CKD 3 and Carotid                 Disease; Risk Factors:Hypertension and Former Smoker.  Sonographer:    Dondra Prader RVT RCS Referring Phys: [id number] OLADAPO ADEFESO IMPRESSIONS  1. Left ventricular ejection fraction, by estimation, is 25 to 30%. The left ventricle has severely decreased function. The left ventricle demonstrates global hypokinesis. There is mild left ventricular hypertrophy. Left ventricular diastolic parameters  are consistent with Grade III diastolic dysfunction (restrictive).  2. Right ventricular systolic function is mildly reduced. The right ventricular size is normal. Tricuspid regurgitation signal is inadequate for assessing PA pressure.  3. Left atrial size was severely dilated.  4. Right atrial size was mildly dilated.  5. The mitral valve is grossly normal. Mild mitral valve regurgitation. No evidence of mitral stenosis.  6. The aortic valve is tricuspid. Aortic valve regurgitation is trivial. No aortic stenosis is present.  7. The inferior vena cava is normal in size with <50% respiratory variability, suggesting right atrial pressure of 8 mmHg. Comparison(s): Changes from prior study are noted. The left ventricular function is worsened. FINDINGS  Left Ventricle: Left ventricular ejection fraction, by estimation, is 25 to 30%. The left ventricle has severely decreased function. The left ventricle demonstrates global hypokinesis. The left ventricular internal cavity size was normal in size.  There is mild left ventricular hypertrophy. Left ventricular diastolic parameters are consistent with Grade III diastolic dysfunction (restrictive). Right Ventricle: The right ventricular size is normal. No increase in right ventricular wall thickness. Right ventricular systolic function is mildly reduced. Tricuspid regurgitation signal is inadequate for assessing PA pressure. Left Atrium: Left atrial size was severely dilated. Right Atrium: Right atrial size was mildly dilated. Pericardium: Trivial pericardial effusion is present. Mitral Valve: The mitral valve is grossly normal. Mild mitral valve regurgitation. No evidence of mitral valve stenosis. Tricuspid Valve: The tricuspid valve is grossly normal. Tricuspid valve regurgitation is trivial. No evidence of tricuspid stenosis. Aortic Valve: The aortic valve is tricuspid. Aortic valve regurgitation is trivial. No aortic stenosis is present. Aortic valve mean gradient measures 2.0 mmHg. Aortic valve peak gradient measures 3.5 mmHg. Aortic valve area, by VTI measures 4.20 cm. Pulmonic Valve: The pulmonic valve was grossly normal. Pulmonic valve regurgitation is trivial. No evidence of pulmonic stenosis. Aorta: The aortic root and ascending aorta are structurally normal, with no evidence of dilitation. Venous: The inferior vena cava is normal in size with less than 50% respiratory variability, suggesting right atrial pressure of 8 mmHg. IAS/Shunts: The atrial  septum is grossly normal.  LEFT VENTRICLE PLAX 2D LVIDd:         5.20 cm   Diastology LVIDs:         3.80 cm   LV e' medial:    3.62 cm/s LV PW:         1.20 cm   LV E/e' medial:  30.9 LV IVS:        1.30 cm   LV e' lateral:   7.81 cm/s LVOT diam:     2.30 cm   LV E/e' lateral: 14.3 LV SV:         74 LV SV Index:   40 LVOT Area:     4.15 cm                           3D Volume EF:                          3D EF:        34 %                          LV EDV:       254 ml                          LV ESV:        167 ml                          LV SV:        88 ml RIGHT VENTRICLE RV Basal diam:  3.80 cm RV Mid diam:    3.20 cm RV S prime:     6.30 cm/s TAPSE (M-mode): 2.4 cm LEFT ATRIUM              Index        RIGHT ATRIUM           Index LA diam:        4.70 cm  2.54 cm/m   RA Area:     19.40 cm LA Vol (A2C):   133.0 ml 71.83 ml/m  RA Volume:   66.80 ml  36.08 ml/m LA Vol (A4C):   104.8 ml 56.60 ml/m LA Biplane Vol: 136.0 ml 73.45 ml/m  AORTIC VALVE                    PULMONIC VALVE AV Area (Vmax):    4.19 cm     PV Vmax:       0.79 m/s AV Area (Vmean):   3.68 cm     PV Peak grad:  2.5 mmHg AV Area (VTI):     4.20 cm AV Vmax:           93.10 cm/s AV Vmean:          64.400 cm/s AV VTI:            0.176 m AV Peak Grad:      3.5 mmHg AV Mean Grad:      2.0 mmHg LVOT Vmax:         93.80 cm/s LVOT Vmean:        57.000 cm/s LVOT VTI:          0.178 m LVOT/AV VTI ratio: 1.01  AORTA Ao Root diam: 3.50 cm Ao  Asc diam:  3.50 cm MITRAL VALVE MV Area (PHT): 5.02 cm     SHUNTS MV Decel Time: 151 msec     Systemic VTI:  0.18 m MV E velocity: 112.00 cm/s  Systemic Diam: 2.30 cm MV A velocity: 41.90 cm/s MV E/A ratio:  2.67 Lennie Odor MD Electronically signed by Lennie Odor MD Signature Date/Time: 07/30/2022/4:32:49 PM    Final    CT Angio Chest PE W and/or Wo Contrast  Result Date: 07/28/2022 CLINICAL DATA:  Shortness of breath. EXAM: CT ANGIOGRAPHY CHEST WITH CONTRAST TECHNIQUE: Multidetector CT imaging of the chest was performed using the standard protocol during bolus administration of intravenous contrast. Multiplanar CT image reconstructions and MIPs were obtained to evaluate the vascular anatomy. RADIATION DOSE REDUCTION: This exam was performed according to the departmental dose-optimization program which includes automated exposure control, adjustment of the mA and/or kV according to patient size and/or use of iterative reconstruction technique. CONTRAST:  75mL OMNIPAQUE IOHEXOL 350 MG/ML SOLN COMPARISON:   April 25, 2022 FINDINGS: Cardiovascular: There is marked severity calcification of the thoracic aorta, without evidence of aortic aneurysm. Satisfactory opacification of the pulmonary arteries to the segmental level. No evidence of pulmonary embolism. There is mild cardiomegaly, with marked severity coronary artery calcification. No pericardial effusion. Mediastinum/Nodes: No enlarged mediastinal, hilar, or axillary lymph nodes. Thyroid gland, trachea, and esophagus demonstrate no significant findings. Lungs/Pleura: There is moderate to marked severity lingular atelectasis and/or infiltrate. Mild to moderate severity anteromedial right middle lobe and anterior right lower lobe scarring and/or atelectasis is also noted. These areas are new when compared to the prior study. The 4 mm posteromedial left lower lobe lung nodule seen on the prior study is not clearly identified on the current exam. A trace amount of pleural fluid is noted, bilaterally. No pneumothorax is identified. Upper Abdomen: Stable 7 mm and 14 mm foci of parenchymal low attenuation are seen within the anterior aspect of the right lobe of the liver. Multiple small parenchymal calcifications are seen within the visualized portion of body of the pancreas. Pancreatic ductal dilatation is also noted (approximately 8.2 cm in diameter). Musculoskeletal: Chronic right-sided rib fractures are seen. No acute osseous abnormalities are identified. Review of the MIP images confirms the above findings. IMPRESSION: 1. No evidence of pulmonary embolism. 2. Moderate to marked severity lingular atelectasis and/or infiltrate. 3. Mild to moderate severity anteromedial right middle lobe and anterior right lower lobe scarring and/or atelectasis. 4. Trace amount of pleural fluid, bilaterally. 5. Stable hepatic cysts. 6. Findings consistent with chronic pancreatitis. 7. Aortic atherosclerosis. Aortic Atherosclerosis (ICD10-I70.0). Electronically Signed   By: Aram Candela M.D.   On: 07/28/2022 22:27   DG Chest 2 View  Result Date: 07/28/2022 CLINICAL DATA:  SOB EXAM: CHEST - 2 VIEW COMPARISON:  Chest x-ray 07/24/2022 FINDINGS: Heart is prominent in size. The heart and mediastinal contours are unchanged. Aortic calcification. Right lower lung zone airspace opacity. No focal consolidation. No pulmonary edema. No pleural effusion. No pneumothorax. No acute osseous abnormality. IMPRESSION: 1. Right lower lung zone airspace opacity. Followup PA and lateral chest X-ray is recommended in 3-4 weeks following therapy to ensure resolution and exclude underlying malignancy. 2.  Aortic Atherosclerosis (ICD10-I70.0). Electronically Signed   By: Tish Frederickson M.D.   On: 07/28/2022 18:09   DG Chest Port 1 View  Result Date: 07/24/2022 CLINICAL DATA:  Shortness of breath. EXAM: PORTABLE CHEST 1 VIEW COMPARISON:  07/19/2022 FINDINGS: The cardio pericardial silhouette is enlarged. Interstitial markings are diffusely  coarsened with chronic features. No overt airspace pulmonary edema or focal consolidation. Hazy opacity at the left base may be atelectasis and/or layering pleural fluid. Telemetry leads overlie the chest. IMPRESSION: 1. Chronic interstitial coarsening without overt airspace pulmonary edema. 2. Possible layering left pleural effusion. Electronically Signed   By: Kennith Center M.D.   On: 07/24/2022 11:59   DG Chest 2 View  Result Date: 07/21/2022 CLINICAL DATA:  COPD, shortness of breath EXAM: CHEST - 2 VIEW COMPARISON:  05/07/2022 FINDINGS: Transverse diameter of heart is increased. Central pulmonary vessels are more prominent. Patchy densities are noted in right lower lung field in the PA view which could not be localized in the lateral view. There is slight prominence of interstitial markings in the parahilar regions and lower lung fields. There is no pleural effusion or pneumothorax. IMPRESSION: Cardiomegaly. Central pulmonary vessels are more prominent suggesting  CHF. Small patchy densities are seen in the lateral aspect of right lower lung field seen only in the PA view. This may suggest atelectasis/pneumonia. There is no pleural effusion or pneumothorax. Electronically Signed   By: Ernie Avena M.D.   On: 07/21/2022 10:07   US Abdomen Limited RUQ (LIVER/GB)  Result Date: 07/21/2022 CLINICAL DATA:  Abnormal liver function tests EXAM: ULTRASOUND ABDOMEN LIMITED RIGHT UPPER QUADRANT COMPARISON:  CT done on 01/25/2022 FINDINGS: Gallbladder: There is large echogenic structure measuring 3.2 cm with acoustic shadowing in gallbladder fossa suggesting large gallbladder stone. Technologist did not observe any tenderness over the gallbladder fossa. Gallbladder is not distended. Common bile duct: Diameter: 4 mm Liver: There is increased echogenicity in liver. No focal abnormalities are seen in visualized portions of liver. Portal vein is patent on color Doppler imaging with normal direction of blood flow towards the liver. Other: None. IMPRESSION: Large gallbladder stone is noted filling the gallbladder. There are no imaging signs of acute cholecystitis. There is no dilation of bile ducts. There is increased echogenicity in liver suggesting possible fatty infiltration. Electronically Signed   By: Ernie Avena M.D.   On: 07/21/2022 10:03    Catarina Hartshorn, DO  [facility]  If 7PM-7AM, please contact night-coverage [email or web address] Password [email or web address] 07/30/2022, 6:22 PM   LOS: 2 days

## 2022-07-30 NOTE — Progress Notes (Signed)
   07/30/22 0800  ReDS Vest / Clip  Station Marker C  Ruler Value 31  ReDS Value Range 36 - 40  ReDS Actual Value 39

## 2022-07-31 DIAGNOSIS — N1832 Chronic kidney disease, stage 3b: Secondary | ICD-10-CM | POA: Diagnosis not present

## 2022-07-31 DIAGNOSIS — I10 Essential (primary) hypertension: Secondary | ICD-10-CM | POA: Diagnosis not present

## 2022-07-31 DIAGNOSIS — I5023 Acute on chronic systolic (congestive) heart failure: Secondary | ICD-10-CM | POA: Diagnosis not present

## 2022-07-31 LAB — BASIC METABOLIC PANEL
Anion gap: 11 (ref 5–15)
BUN: 31 mg/dL — ABNORMAL HIGH (ref 8–23)
CO2: 28 mmol/L (ref 22–32)
Calcium: 8.5 mg/dL — ABNORMAL LOW (ref 8.9–10.3)
Chloride: 100 mmol/L (ref 98–111)
Creatinine, Ser: 2.02 mg/dL — ABNORMAL HIGH (ref 0.61–1.24)
GFR, Estimated: 35 mL/min — ABNORMAL LOW (ref >60)
Glucose, Bld: 123 mg/dL — ABNORMAL HIGH (ref 70–99)
Potassium: 3.5 mmol/L (ref 3.5–5.1)
Sodium: 139 mmol/L (ref 135–145)

## 2022-07-31 LAB — CULTURE, BLOOD (ROUTINE X 2): Culture: NO GROWTH

## 2022-07-31 LAB — MAGNESIUM: Magnesium: 1.9 mg/dL (ref 1.7–2.4)

## 2022-07-31 MED ORDER — FUROSEMIDE 40 MG PO TABS: 40.0000 mg | ORAL_TABLET | Freq: Two times a day (BID) | ORAL | 1 refills | Status: DC

## 2022-07-31 MED ORDER — FUROSEMIDE 40 MG PO TABS: 40.0000 mg | ORAL_TABLET | Freq: Two times a day (BID) | ORAL | Status: DC

## 2022-07-31 NOTE — Discharge Summary (Signed)
Physician Discharge Summary   Patient: Michael [name] MRN: [id number] DOB: Apr 20, 1955  Admit date:     07/28/2022  Discharge date: 07/31/22  Discharge Physician: Onalee Hua Alpa Salvo   PCP: Benita Stabile, MD   Recommendations at discharge:   Please follow up with primary care provider within 1-2 weeks  Please repeat BMP and CBC in one week     Hospital Course: 67 year old male with a history of HFrEF, CKD stage III, coronary disease, COPD, tobacco abuse, basal cell carcinoma status post partial rhinectomy and maxillectomy 06/06/2017 at [facility], advanced colonic tubular adenoma, right carotid stenosis, hypertension, presenting with shortness of breath since he was discharged from the hospital on 07/26/2022. As noted, the patient has numerous hospitalizations, most recently from 07/24/2022 to 07/26/2022 during which she was treated for acute on chronic HFrEF.  He was discharged with furosemide 40 mg once daily.  His Coreg was increased to 6.25 mg twice daily.  His discharge weight was 64.6 kg.  Prior to that, he had another hospital admission 05/05/2022 to 05/06/2022 again for acute on chronic HFrEF and COPD exacerbation.  His discharge weight was 67.5 kg.  Other hospitalizations in 2024 as below: Admitted to the hospital from 04/29/2022 to 05/03/2022 for syncope and COPD exacerbation. He was discharged with prednisone taper, albuterol. It was felt his syncope was related to orthostasis. His Entresto and amlodipine were discontinued during that hospitalization. Prior to that, the patient was also hospitalized from 04/25/2022 to 04/27/2022 due to elevated troponin. This was thought to be related to demand ischemia. He also had some asymptomatic NSVT.     His medical treatment has been difficult secondary to his social situation and current homelessness, poor compliance, and poor insight. He presented again on the evening of 07/28/2022 secondary to shortness of breath and dyspnea on exertion.  He denies any fevers, chills,  chest pain, nausea, vomiting, direct abdominal pain.  In the ED, the patient was afebrile and hemodynamically stable with oxygen saturation 100% room air.  WBC 8.3, hemoglobin 13.0, platelets 349,000.  Sodium 139, potassium 4.0, bicarbonate 29, serum creatinine 1.77.  CTA chest was negative for PE but showed moderate to severe lingular atelectasis and/or infiltrate.  There is trace bilateral pleural effusions.  Chest x-ray showed right lower lobe opacity.  The patient was given IV furosemide and admitted for further evaluation and treatment.  Assessment and Plan: Dyspnea -Multifactorial including COPD HFrEF, and deconditioning -6/27 CTA chest--no PE, trace bilateral pleural eff -Started DuoNebs -Started Pulmicort -started brovana -PCT 0.23 -d/c abx   Elevated troponin -No chest pain presently -Secondary to demand ischemia -Cardiology consult appreciated--no further inpt workup -04/03/2022 echo EF 35-40%, global HK, normal PASP, mild TR   Acute on Chronic HFrEF -Entresto was discontinued during his Early April 2024 hospitalization -04/30/2022 echo EF 35-40%, global HK, normal PASP, mild TR -Resume carvedilol -ReDS = 39>>>15 -increased lasix to 80 IV bid>>d/c home with lasix 40 mg po bid -weight 143>>135 lbs>>133.5 lbs -pt endorsed indiscretion with fluid intake and admits it will be difficult for him to decrease his fluid intake at home   CKD stage IIIb -Baseline creatinine 1.7-2.0   Essential hypertension -continue coreg   Tobacco abuse -Tobacco cessation discussed -pt now states he quit one month ago   Right Carotid stenosis -The patient has follow-up with Dr. Leonette Most fields -05/12/2017 carotid ultrasound shows 80-99% right carotid stenosis -70% heavily calcified RICA stenosis and no significant left-sided disease by CT angiography.  He is now following with Dr. Olga Coaster  was not felt to be a candidate for carotid stenting and suboptimal candidate for endarterectomy. Plan is  for medical therapy at this point -continue plavix   Basal cell carcinoma of nasolabial fold -06/06/2017--right partial rhinectomy, resection of left intranasal lesion, skin graft @[facility] -Margins were clear on pathology -Most recent follow-up was 06/23/2017   Iron deficiency anemia/Advanced colonic adenoma -The patient had colonoscopy 05/31/2017--Dr. Rourk -He was noted to have numerous polyps as well as a polypoid mass in the hepatic flexure to the cecum area -pathology of mass--tubular adenoma -He was referred to general surgery, Dr. Larae Grooms for hemicolectomy -He has been lost to follow-up -noncompliant with po iron         Consultants: cardiology Procedures performed: none  Disposition: Home Diet recommendation:  Cardiac diet DISCHARGE MEDICATION: Allergies as of 07/31/2022   No Known Allergies      Medication List     TAKE these medications    acetaminophen 500 MG tablet Commonly known as: TYLENOL Take 1,000 mg by mouth daily as needed for fever, headache or moderate pain.   albuterol 108 (90 Base) MCG/ACT inhaler Commonly known as: VENTOLIN HFA Inhale 2 puffs into the lungs every 6 (six) hours as needed for wheezing or shortness of breath.   atorvastatin 40 MG tablet Commonly known as: LIPITOR Take 1 tablet (40 mg total) by mouth every evening. Start taking on: August 01, 2022   Breztri Aerosphere 160-9-4.8 MCG/ACT Aero Generic drug: Budeson-Glycopyrrol-Formoterol Inhale 2 puffs into the lungs 2 (two) times daily.   carvedilol 6.25 MG tablet Commonly known as: Coreg Take 1 tablet (6.25 mg total) by mouth 2 (two) times daily with a meal.   clopidogrel 75 MG tablet Commonly known as: PLAVIX Take 1 tablet (75 mg total) by mouth daily.   furosemide 40 MG tablet Commonly known as: LASIX Take 1 tablet (40 mg total) by mouth 2 (two) times daily. Start taking on: August 01, 2022 What changed: when to take this   isosorbide mononitrate 60 MG 24 hr  tablet Commonly known as: IMDUR Take 1 tablet (60 mg total) by mouth every evening.   potassium chloride SA 20 MEQ tablet Commonly known as: KLOR-CON M Take 1 tablet (20 mEq total) by mouth daily.        Discharge Exam: Filed Weights   07/28/22 2338 07/30/22 0717 07/31/22 0415  Weight: 65.2 kg 62.1 kg 60.6 kg   HEENT:  [place]/AT, No thrush, no icterus CV:  RRR, no rub, no S3, no S4 Lung:  CTA, no wheeze, no rhonchi Abd:  soft/+BS, NT Ext:  No edema, no lymphangitis, no synovitis, no rash   Condition at discharge: stable  The results of significant diagnostics from this hospitalization (including imaging, microbiology, ancillary and laboratory) are listed below for reference.   Imaging Studies: ECHOCARDIOGRAM COMPLETE  Result Date: 07/30/2022    ECHOCARDIOGRAM REPORT   [name] Date of Exam: 07/29/2022 Medical Rec #:  [id number]     Height:       72.0 in Accession #:    [id number]    Weight:       143.7 lb Date of Birth:  12-06-1955     BSA:          1.852 m Patient Age:    67 years      BP:           124/70 mmHg Patient Gender: M  HR:           63 bpm. [facility] Procedure: 2D Echo, 3D Echo, Cardiac Doppler and Color Doppler Indications:    I25.10 CAD  History:        Patient has prior history of Echocardiogram examinations, most                 recent 04/30/2022. Cardiomyopathy, CAD, COPD, CKD 3 and Carotid                 Disease; Risk Factors:Hypertension and Former Smoker.  Sonographer:    Dondra Prader RVT RCS Referring Phys: [id number] OLADAPO ADEFESO IMPRESSIONS  1. Left ventricular ejection fraction, by estimation, is 25 to 30%. The left ventricle has severely decreased function. The left ventricle demonstrates global hypokinesis. There is mild left ventricular hypertrophy. Left ventricular diastolic parameters  are consistent with Grade III diastolic dysfunction (restrictive).  2. Right ventricular systolic function is mildly reduced. The  right ventricular size is normal. Tricuspid regurgitation signal is inadequate for assessing PA pressure.  3. Left atrial size was severely dilated.  4. Right atrial size was mildly dilated.  5. The mitral valve is grossly normal. Mild mitral valve regurgitation. No evidence of mitral stenosis.  6. The aortic valve is tricuspid. Aortic valve regurgitation is trivial. No aortic stenosis is present.  7. The inferior vena cava is normal in size with <50% respiratory variability, suggesting right atrial pressure of 8 mmHg. Comparison(s): Changes from prior study are noted. The left ventricular function is worsened. FINDINGS  Left Ventricle: Left ventricular ejection fraction, by estimation, is 25 to 30%. The left ventricle has severely decreased function. The left ventricle demonstrates global hypokinesis. The left ventricular internal cavity size was normal in size. There is mild left ventricular hypertrophy. Left ventricular diastolic parameters are consistent with Grade III diastolic dysfunction (restrictive). Right Ventricle: The right ventricular size is normal. No increase in right ventricular wall thickness. Right ventricular systolic function is mildly reduced. Tricuspid regurgitation signal is inadequate for assessing PA pressure. Left Atrium: Left atrial size was severely dilated. Right Atrium: Right atrial size was mildly dilated. Pericardium: Trivial pericardial effusion is present. Mitral Valve: The mitral valve is grossly normal. Mild mitral valve regurgitation. No evidence of mitral valve stenosis. Tricuspid Valve: The tricuspid valve is grossly normal. Tricuspid valve regurgitation is trivial. No evidence of tricuspid stenosis. Aortic Valve: The aortic valve is tricuspid. Aortic valve regurgitation is trivial. No aortic stenosis is present. Aortic valve mean gradient measures 2.0 mmHg. Aortic valve peak gradient measures 3.5 mmHg. Aortic valve area, by VTI measures 4.20 cm. Pulmonic Valve: The pulmonic  valve was grossly normal. Pulmonic valve regurgitation is trivial. No evidence of pulmonic stenosis. Aorta: The aortic root and ascending aorta are structurally normal, with no evidence of dilitation. Venous: The inferior vena cava is normal in size with less than 50% respiratory variability, suggesting right atrial pressure of 8 mmHg. IAS/Shunts: The atrial septum is grossly normal.  LEFT VENTRICLE PLAX 2D LVIDd:         5.20 cm   Diastology LVIDs:         3.80 cm   LV e' medial:    3.62 cm/s LV PW:         1.20 cm   LV E/e' medial:  30.9 LV IVS:        1.30 cm   LV e' lateral:   7.81 cm/s LVOT diam:     2.30 cm  LV E/e' lateral: 14.3 LV SV:         74 LV SV Index:   40 LVOT Area:     4.15 cm                           3D Volume EF:                          3D EF:        34 %                          LV EDV:       254 ml                          LV ESV:       167 ml                          LV SV:        88 ml RIGHT VENTRICLE RV Basal diam:  3.80 cm RV Mid diam:    3.20 cm RV S prime:     6.30 cm/s TAPSE (M-mode): 2.4 cm LEFT ATRIUM              Index        RIGHT ATRIUM           Index LA diam:        4.70 cm  2.54 cm/m   RA Area:     19.40 cm LA Vol (A2C):   133.0 ml 71.83 ml/m  RA Volume:   66.80 ml  36.08 ml/m LA Vol (A4C):   104.8 ml 56.60 ml/m LA Biplane Vol: 136.0 ml 73.45 ml/m  AORTIC VALVE                    PULMONIC VALVE AV Area (Vmax):    4.19 cm     PV Vmax:       0.79 m/s AV Area (Vmean):   3.68 cm     PV Peak grad:  2.5 mmHg AV Area (VTI):     4.20 cm AV Vmax:           93.10 cm/s AV Vmean:          64.400 cm/s AV VTI:            0.176 m AV Peak Grad:      3.5 mmHg AV Mean Grad:      2.0 mmHg LVOT Vmax:         93.80 cm/s LVOT Vmean:        57.000 cm/s LVOT VTI:          0.178 m LVOT/AV VTI ratio: 1.01  AORTA Ao Root diam: 3.50 cm Ao Asc diam:  3.50 cm MITRAL VALVE MV Area (PHT): 5.02 cm     SHUNTS MV Decel Time: 151 msec     Systemic VTI:  0.18 m MV E velocity: 112.00 cm/s  Systemic Diam:  2.30 cm MV A velocity: 41.90 cm/s MV E/A ratio:  2.67 Lennie Odor MD Electronically signed by Lennie Odor MD Signature Date/Time: 07/30/2022/4:32:49 PM    Final    CT Angio Chest PE W and/or Wo Contrast  Result Date: 07/28/2022 CLINICAL DATA:  Shortness of breath. EXAM: CT ANGIOGRAPHY CHEST  WITH CONTRAST TECHNIQUE: Multidetector CT imaging of the chest was performed using the standard protocol during bolus administration of intravenous contrast. Multiplanar CT image reconstructions and MIPs were obtained to evaluate the vascular anatomy. RADIATION DOSE REDUCTION: This exam was performed according to the departmental dose-optimization program which includes automated exposure control, adjustment of the mA and/or kV according to patient size and/or use of iterative reconstruction technique. CONTRAST:  75mL OMNIPAQUE IOHEXOL 350 MG/ML SOLN COMPARISON:  April 25, 2022 FINDINGS: Cardiovascular: There is marked severity calcification of the thoracic aorta, without evidence of aortic aneurysm. Satisfactory opacification of the pulmonary arteries to the segmental level. No evidence of pulmonary embolism. There is mild cardiomegaly, with marked severity coronary artery calcification. No pericardial effusion. Mediastinum/Nodes: No enlarged mediastinal, hilar, or axillary lymph nodes. Thyroid gland, trachea, and esophagus demonstrate no significant findings. Lungs/Pleura: There is moderate to marked severity lingular atelectasis and/or infiltrate. Mild to moderate severity anteromedial right middle lobe and anterior right lower lobe scarring and/or atelectasis is also noted. These areas are new when compared to the prior study. The 4 mm posteromedial left lower lobe lung nodule seen on the prior study is not clearly identified on the current exam. A trace amount of pleural fluid is noted, bilaterally. No pneumothorax is identified. Upper Abdomen: Stable 7 mm and 14 mm foci of parenchymal low attenuation are seen within  the anterior aspect of the right lobe of the liver. Multiple small parenchymal calcifications are seen within the visualized portion of body of the pancreas. Pancreatic ductal dilatation is also noted (approximately 8.2 cm in diameter). Musculoskeletal: Chronic right-sided rib fractures are seen. No acute osseous abnormalities are identified. Review of the MIP images confirms the above findings. IMPRESSION: 1. No evidence of pulmonary embolism. 2. Moderate to marked severity lingular atelectasis and/or infiltrate. 3. Mild to moderate severity anteromedial right middle lobe and anterior right lower lobe scarring and/or atelectasis. 4. Trace amount of pleural fluid, bilaterally. 5. Stable hepatic cysts. 6. Findings consistent with chronic pancreatitis. 7. Aortic atherosclerosis. Aortic Atherosclerosis (ICD10-I70.0). Electronically Signed   By: Aram Candela M.D.   On: 07/28/2022 22:27   DG Chest 2 View  Result Date: 07/28/2022 CLINICAL DATA:  SOB EXAM: CHEST - 2 VIEW COMPARISON:  Chest x-ray 07/24/2022 FINDINGS: Heart is prominent in size. The heart and mediastinal contours are unchanged. Aortic calcification. Right lower lung zone airspace opacity. No focal consolidation. No pulmonary edema. No pleural effusion. No pneumothorax. No acute osseous abnormality. IMPRESSION: 1. Right lower lung zone airspace opacity. Followup PA and lateral chest X-ray is recommended in 3-4 weeks following therapy to ensure resolution and exclude underlying malignancy. 2.  Aortic Atherosclerosis (ICD10-I70.0). Electronically Signed   By: Tish Frederickson M.D.   On: 07/28/2022 18:09   DG Chest Port 1 View  Result Date: 07/24/2022 CLINICAL DATA:  Shortness of breath. EXAM: PORTABLE CHEST 1 VIEW COMPARISON:  07/19/2022 FINDINGS: The cardio pericardial silhouette is enlarged. Interstitial markings are diffusely coarsened with chronic features. No overt airspace pulmonary edema or focal consolidation. Hazy opacity at the left base  may be atelectasis and/or layering pleural fluid. Telemetry leads overlie the chest. IMPRESSION: 1. Chronic interstitial coarsening without overt airspace pulmonary edema. 2. Possible layering left pleural effusion. Electronically Signed   By: Kennith Center M.D.   On: 07/24/2022 11:59   DG Chest 2 View  Result Date: 07/21/2022 CLINICAL DATA:  COPD, shortness of breath EXAM: CHEST - 2 VIEW COMPARISON:  05/07/2022 FINDINGS: Transverse diameter of heart is increased. Central  pulmonary vessels are more prominent. Patchy densities are noted in right lower lung field in the PA view which could not be localized in the lateral view. There is slight prominence of interstitial markings in the parahilar regions and lower lung fields. There is no pleural effusion or pneumothorax. IMPRESSION: Cardiomegaly. Central pulmonary vessels are more prominent suggesting CHF. Small patchy densities are seen in the lateral aspect of right lower lung field seen only in the PA view. This may suggest atelectasis/pneumonia. There is no pleural effusion or pneumothorax. Electronically Signed   By: Ernie Avena M.D.   On: 07/21/2022 10:07   US Abdomen Limited RUQ (LIVER/GB)  Result Date: 07/21/2022 CLINICAL DATA:  Abnormal liver function tests EXAM: ULTRASOUND ABDOMEN LIMITED RIGHT UPPER QUADRANT COMPARISON:  CT done on 01/25/2022 FINDINGS: Gallbladder: There is large echogenic structure measuring 3.2 cm with acoustic shadowing in gallbladder fossa suggesting large gallbladder stone. Technologist did not observe any tenderness over the gallbladder fossa. Gallbladder is not distended. Common bile duct: Diameter: 4 mm Liver: There is increased echogenicity in liver. No focal abnormalities are seen in visualized portions of liver. Portal vein is patent on color Doppler imaging with normal direction of blood flow towards the liver. Other: None. IMPRESSION: Large gallbladder stone is noted filling the gallbladder. There are no imaging  signs of acute cholecystitis. There is no dilation of bile ducts. There is increased echogenicity in liver suggesting possible fatty infiltration. Electronically Signed   By: Ernie Avena M.D.   On: 07/21/2022 10:03    Microbiology: Results for orders placed or performed during the hospital encounter of 07/28/22  Culture, blood (Routine X 2) w Reflex to ID Panel     Status: None (Preliminary result)   Collection Time: 07/29/22  8:55 AM   Specimen: Left Antecubital; Blood  Result Value Ref Range Status   Specimen Description   Final    LEFT ANTECUBITAL BOTTLES DRAWN AEROBIC AND ANAEROBIC   Special Requests Blood Culture adequate volume  Final   Culture   Final    NO GROWTH 2 DAYS Performed at [facility], [address], [place], [place] [address]    Report Status PENDING  Incomplete  Culture, blood (Routine X 2) w Reflex to ID Panel     Status: None (Preliminary result)   Collection Time: 07/29/22  8:55 AM   Specimen: BLOOD LEFT HAND  Result Value Ref Range Status   Specimen Description   Final    BLOOD LEFT HAND BOTTLES DRAWN AEROBIC AND ANAEROBIC   Special Requests Blood Culture adequate volume  Final   Culture   Final    NO GROWTH 2 DAYS Performed at [facility], [address] [place], [place] [address]    Report Status PENDING  Incomplete    Labs: CBC: Recent Labs  Lab 07/24/22 1130 07/25/22 0337 07/26/22 0442 07/28/22 1800  WBC 12.5* 13.6* 9.7 8.3  NEUTROABS 9.8*  --   --   --   HGB 13.1 10.9* 11.4* 13.0  HCT 41.7 34.7* 36.3* 42.6  MCV 91.6 91.3 91.2 93.8  PLT 270 257 276 349   Basic Metabolic Panel: Recent Labs  Lab 07/24/22 1130 07/24/22 1210 07/25/22 0337 07/26/22 0442 07/26/22 1458 07/28/22 1800 07/28/22 2228 07/30/22 0518 07/31/22 0431  NA  --    < > 134* 139  --  139  --  140 139  K  --    < > 2.8* 2.6* 3.3* 4.0  --  3.6 3.5  CL  --    < >  104 101  --  101  --  100 100  CO2  --    < > 21* 27  --  29  --  29 28  GLUCOSE  --    < > 133*  118*  --  153*  --  140* 123*  BUN  --    < > 42* 32*  --  27*  --  27* 31*  CREATININE  --    < > 1.96* 1.72*  --  1.77*  --  1.84* 2.02*  CALCIUM  --    < > 7.4* 7.7*  --  8.7*  --  8.4* 8.5*  MG 2.0  --   --   --   --   --  1.9 1.9 1.9  PHOS 3.5  --   --   --   --   --  3.4  --   --    < > = values in this interval not displayed.   Liver Function Tests: Recent Labs  Lab 07/24/22 1210 07/25/22 0337 07/26/22 0442  AST 56* 35 38  ALT 216* 136* 117*  ALKPHOS 105 86 97  BILITOT 2.8* 2.0* 1.6*  PROT 6.3* 5.5* 6.1*  ALBUMIN 3.0* 2.6* 2.7*   CBG: Recent Labs  Lab 07/25/22 0854 07/26/22 0711  GLUCAP 135* 118*    Discharge time spent: greater than 30 minutes.  Signed: Catarina Hartshorn, MD [facility] 07/31/2022

## 2022-07-31 NOTE — Plan of Care (Signed)

## 2022-07-31 NOTE — Progress Notes (Signed)
   07/31/22 0900  ReDS Vest / Clip  Station Marker D  Ruler Value 31  ReDS Value Range < 36  ReDS Actual Value 15

## 2022-07-31 NOTE — Progress Notes (Signed)
Patient cooperative and calm overnight. He is A&O x4. He ambulated from bed to BR.

## 2022-08-01 LAB — CULTURE, BLOOD (ROUTINE X 2)

## 2022-08-02 ENCOUNTER — Encounter: Payer: Self-pay | Admitting: *Deleted

## 2022-08-02 ENCOUNTER — Ambulatory Visit: Payer: Self-pay | Admitting: *Deleted

## 2022-08-02 LAB — LEGIONELLA PNEUMOPHILA SEROGP 1 UR AG: L. pneumophila Serogp 1 Ur Ag: NEGATIVE

## 2022-08-02 LAB — CULTURE, BLOOD (ROUTINE X 2)
Culture: NO GROWTH
Special Requests: ADEQUATE

## 2022-08-02 NOTE — Patient Outreach (Signed)
  Care Coordination   Initial Visit Note   08/02/2022 Name: Michael [name] MRN: [id number] DOB: May 21, 1955  Lamonte Rosencrantz is a 67 y.o. year old male who sees [name], [name], MD for primary care. I spoke with [name], friend of Haymond Lipschutz by phone today.  What matters to the patients health and wellness today?  congestive Heart Failure (CHF) - recent hospital discharges x 2 for acute CHF.  [name] reports she believes Mr Shehee was discharged too early on 07/26/22  (CHF Pneumonia) and his cardiologist sent him back to the ED during the post hospital visit with noted worsening shortness of breath He does not weigh burt reports he has stopped the use of salt Pending CHF clinic   Smoker- continues with intervals of smoking when he able to have someone purchase them for him   [facility] PT -was scheduled to visit today after 1200 but did not show up  COPD -continues with shortness of breath with exertion       Goals Addressed             This Visit's Progress    Manage CHF -[facility] nurse care coordination services       Interventions Today    Flowsheet Row Most Recent Value  Chronic Disease   Chronic disease during today's visit Chronic Obstructive Pulmonary Disease (COPD), Congestive Heart Failure (CHF), Hypertension (HTN)  General Interventions   General Interventions Discussed/Reviewed General Interventions Discussed, Doctor Visits  Doctor Visits Discussed/Reviewed Doctor Visits Discussed, PCP  PCP/Specialist Visits Compliance with follow-up visit  [enouraged]  Exercise Interventions   Exercise Discussed/Reviewed Exercise Discussed  Education Interventions   Education Provided Provided Education  Provided Verbal Education On Nutrition, Medication, Labs, Other, When to see the doctor  [facility] education on heart failure, chronic kidney disease, eating plans for each, protein energy malnutrition, steps to quit smoking, COPD]  Mental Health Interventions   Mental Health  Discussed/Reviewed Mental Health Discussed  Nutrition Interventions   Nutrition Discussed/Reviewed Nutrition Discussed, Decreasing salt, Increasing proteins  Pharmacy Interventions   Pharmacy Dicussed/Reviewed Pharmacy Topics Discussed, Medications and their functions  Safety Interventions   Safety Discussed/Reviewed Safety Discussed  Advanced Directive Interventions   Advanced Directives Discussed/Reviewed Advanced Directives Discussed              SDOH assessments and interventions completed:  Yes  SDOH Interventions Today    Flowsheet Row Most Recent Value  SDOH Interventions   Food Insecurity Interventions Intervention Not Indicated  Housing Interventions Intervention Not Indicated  Transportation Interventions Intervention Not Indicated  Utilities Interventions Intervention Not Indicated      Mailed education on heart failure, chronic kidney disease, eating plans for each, protein energy malnutrition, steps to quit smoking, COPD  Care Coordination Interventions:  Yes, provided   Follow up plan: Follow up call scheduled for 09/02/22    Encounter Outcome:  Pt. Visit Completed

## 2022-08-02 NOTE — Patient Outreach (Signed)
  Care Coordination   08/02/2022 Name: Michael [name] MRN: [id number] DOB: [birth year]   Care Coordination Outreach Attempts:  An unsuccessful telephone outreach was attempted today to offer the patient information about available care coordination services.  Follow Up Plan:  Additional outreach attempts will be made to offer the patient care coordination information and services.   Encounter Outcome:  Pt. Request to Call Back  Male answered. Stated she was on the phone attempting to make an appointment for herself requested a call back   Care Coordination Interventions:  No, not indicated    Carlena Ruybal L. Noelle Penner, RN, BSN, CCM [facility] Care Management Community Coordinator Office number [phone]

## 2022-08-02 NOTE — Patient Instructions (Signed)
Visit Information  Thank you for taking time to visit with me today. Please don't hesitate to contact me if I can be of assistance to you.   Following are the goals we discussed today:   Goals Addressed   None     Our next appointment is by telephone on 09/02/22 at 1 pm  Please call the care guide team at [phone] if you need to cancel or reschedule your appointment.   If you are experiencing a Mental Health or Behavioral Health Crisis or need someone to talk to, please call the Suicide and Crisis Lifeline: 988 call the [place] National Suicide Prevention Lifeline: 204 096 5978 or TTY: 361-610-6670 TTY 972-150-3443) to talk to a trained counselor call 1-800-273-TALK (toll free, 24 hour hotline) call the [facility]: [phone] call 911   No my chart does not need a copy of patient instruction  The patient has been provided with contact information for the care management team and has been advised to call with any health related questions or concerns.   Anna Beaird L. Noelle Penner, RN, BSN, CCM [facility] Care Management Community Coordinator Office number [phone]

## 2022-08-03 DIAGNOSIS — N1832 Chronic kidney disease, stage 3b: Secondary | ICD-10-CM | POA: Diagnosis not present

## 2022-08-03 DIAGNOSIS — N179 Acute kidney failure, unspecified: Secondary | ICD-10-CM | POA: Diagnosis not present

## 2022-08-03 DIAGNOSIS — J441 Chronic obstructive pulmonary disease with (acute) exacerbation: Secondary | ICD-10-CM | POA: Diagnosis not present

## 2022-08-03 DIAGNOSIS — J9601 Acute respiratory failure with hypoxia: Secondary | ICD-10-CM | POA: Diagnosis not present

## 2022-08-03 DIAGNOSIS — I5043 Acute on chronic combined systolic (congestive) and diastolic (congestive) heart failure: Secondary | ICD-10-CM | POA: Diagnosis not present

## 2022-08-03 DIAGNOSIS — I130 Hypertensive heart and chronic kidney disease with heart failure and stage 1 through stage 4 chronic kidney disease, or unspecified chronic kidney disease: Secondary | ICD-10-CM | POA: Diagnosis not present

## 2022-08-03 LAB — CULTURE, BLOOD (ROUTINE X 2)

## 2022-08-05 ENCOUNTER — Inpatient Hospital Stay: Payer: 59 | Attending: Hematology

## 2022-08-05 DIAGNOSIS — D631 Anemia in chronic kidney disease: Secondary | ICD-10-CM | POA: Insufficient documentation

## 2022-08-05 DIAGNOSIS — J441 Chronic obstructive pulmonary disease with (acute) exacerbation: Secondary | ICD-10-CM | POA: Diagnosis not present

## 2022-08-05 DIAGNOSIS — I5043 Acute on chronic combined systolic (congestive) and diastolic (congestive) heart failure: Secondary | ICD-10-CM | POA: Diagnosis not present

## 2022-08-05 DIAGNOSIS — I130 Hypertensive heart and chronic kidney disease with heart failure and stage 1 through stage 4 chronic kidney disease, or unspecified chronic kidney disease: Secondary | ICD-10-CM | POA: Insufficient documentation

## 2022-08-05 DIAGNOSIS — J9601 Acute respiratory failure with hypoxia: Secondary | ICD-10-CM | POA: Diagnosis not present

## 2022-08-05 DIAGNOSIS — Z923 Personal history of irradiation: Secondary | ICD-10-CM | POA: Insufficient documentation

## 2022-08-05 DIAGNOSIS — Z87891 Personal history of nicotine dependence: Secondary | ICD-10-CM | POA: Insufficient documentation

## 2022-08-05 DIAGNOSIS — Z85828 Personal history of other malignant neoplasm of skin: Secondary | ICD-10-CM | POA: Insufficient documentation

## 2022-08-05 DIAGNOSIS — Z79899 Other long term (current) drug therapy: Secondary | ICD-10-CM | POA: Insufficient documentation

## 2022-08-05 DIAGNOSIS — D509 Iron deficiency anemia, unspecified: Secondary | ICD-10-CM | POA: Insufficient documentation

## 2022-08-05 DIAGNOSIS — Z7902 Long term (current) use of antithrombotics/antiplatelets: Secondary | ICD-10-CM | POA: Insufficient documentation

## 2022-08-05 DIAGNOSIS — N1832 Chronic kidney disease, stage 3b: Secondary | ICD-10-CM | POA: Insufficient documentation

## 2022-08-05 DIAGNOSIS — N179 Acute kidney failure, unspecified: Secondary | ICD-10-CM | POA: Diagnosis not present

## 2022-08-05 DIAGNOSIS — E559 Vitamin D deficiency, unspecified: Secondary | ICD-10-CM | POA: Insufficient documentation

## 2022-08-08 DIAGNOSIS — N1832 Chronic kidney disease, stage 3b: Secondary | ICD-10-CM | POA: Diagnosis not present

## 2022-08-08 DIAGNOSIS — I5043 Acute on chronic combined systolic (congestive) and diastolic (congestive) heart failure: Secondary | ICD-10-CM | POA: Diagnosis not present

## 2022-08-08 DIAGNOSIS — J441 Chronic obstructive pulmonary disease with (acute) exacerbation: Secondary | ICD-10-CM | POA: Diagnosis not present

## 2022-08-08 DIAGNOSIS — J9601 Acute respiratory failure with hypoxia: Secondary | ICD-10-CM | POA: Diagnosis not present

## 2022-08-08 DIAGNOSIS — N179 Acute kidney failure, unspecified: Secondary | ICD-10-CM | POA: Diagnosis not present

## 2022-08-08 DIAGNOSIS — I130 Hypertensive heart and chronic kidney disease with heart failure and stage 1 through stage 4 chronic kidney disease, or unspecified chronic kidney disease: Secondary | ICD-10-CM | POA: Diagnosis not present

## 2022-08-09 DIAGNOSIS — J449 Chronic obstructive pulmonary disease, unspecified: Secondary | ICD-10-CM | POA: Diagnosis not present

## 2022-08-09 DIAGNOSIS — Z7951 Long term (current) use of inhaled steroids: Secondary | ICD-10-CM | POA: Diagnosis not present

## 2022-08-09 DIAGNOSIS — R0600 Dyspnea, unspecified: Secondary | ICD-10-CM | POA: Diagnosis not present

## 2022-08-09 DIAGNOSIS — I5043 Acute on chronic combined systolic (congestive) and diastolic (congestive) heart failure: Secondary | ICD-10-CM | POA: Diagnosis not present

## 2022-08-10 ENCOUNTER — Ambulatory Visit (INDEPENDENT_AMBULATORY_CARE_PROVIDER_SITE_OTHER): Payer: 59 | Admitting: General Surgery

## 2022-08-10 ENCOUNTER — Encounter: Payer: Self-pay | Admitting: General Surgery

## 2022-08-10 VITALS — BP 171/104 | HR 90 | Temp 98.1°F | Resp 20 | Ht 72.0 in | Wt 142.0 lb

## 2022-08-10 DIAGNOSIS — K8020 Calculus of gallbladder without cholecystitis without obstruction: Secondary | ICD-10-CM | POA: Diagnosis not present
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
## 2022-08-10 NOTE — Progress Notes (Signed)
[facility] History and Physical  Reason for Referral: Large gallstone Referring Physician: Leone Payor NP   Chief Complaint   Follow-up     Michael [name] is a 67 y.o. male.  HPI: Michael [name] is well known to me after I did a right hemicolectomy on him for high grade dysplasia lesion. Michael [name] was recently admitted with heart failure and noted to have elevated LFTs that prompted an [place] that showed a large gallstone but no signs of biliary dilation or cholecystitis. Michael [name] denied any abdominal pain or issues eating. Michael [name] says his appetite has not been great but Michael [name] has not pain. His EF was worse this last admission down to 25-30%.    Michael [name] followed up with his PCP who sent him to discuss his gallbladder.   Past Medical History:  Diagnosis Date   Aortic atherosclerosis (HCC) 08/03/2021   CAD (coronary artery disease) 08/03/2021   Cardiac catheterization September 2023 with RCA/RV marginal stenosis managed medically   Cardiomyopathy (HCC)    Carotid artery disease (HCC)    CKD (chronic kidney disease) stage 3, GFR 30-59 ml/min (HCC)    COPD (chronic obstructive pulmonary disease) (HCC)    Essential hypertension    Head and neck cancer (HCC) 2019   Right facial basal cell carcinoma s/p resection with right partial mastectomy and partal rhinectomy with skin graft (06/13/17)   Iron deficiency anemia    Urinary retention     Past Surgical History:  Procedure Laterality Date   ANKLE CLOSED REDUCTION Right    open reduction   BASAL CELL CARCINOMA EXCISION  2019   at [facility]   BIOPSY  07/19/2021   Procedure: BIOPSY;  Surgeon: Lanelle Bal, DO;  Location: [facility];  Service: Endoscopy;;   COLONOSCOPY N/A 05/31/2017   Procedure: COLONOSCOPY;  Surgeon: Corbin Ade, MD;  Location: [facility];  Service: Endoscopy;  Laterality: N/A;  2:45pm   COLONOSCOPY WITH PROPOFOL N/A 07/19/2021   Procedure: COLONOSCOPY WITH PROPOFOL;  Surgeon: Lanelle Bal, DO;  Location: [facility];  Service: Endoscopy;  Laterality: N/A;  3:00pm, moved up to 9:00   CYSTOSCOPY N/A 10/29/2018   Procedure: CYSTOSCOPY, CLOT EVACUATION;  Surgeon: Rene Paci, MD;  Location: [facility];  Service: Urology;  Laterality: N/A;   PARTIAL COLECTOMY Right 08/11/2021   Procedure: PARTIAL COLECTOMY, OPEN RIGHT HEMICOLECTOMY;  Surgeon: Lucretia Roers, MD;  Location: [facility];  Service: General;  Laterality: Right;   POLYPECTOMY  05/31/2017   Procedure: POLYPECTOMY;  Surgeon: Corbin Ade, MD;  [facility]: [facility];  Service: Endoscopy;;   RIGHT/LEFT HEART CATH AND CORONARY ANGIOGRAPHY N/A 10/22/2021   Procedure: RIGHT/LEFT HEART CATH AND CORONARY ANGIOGRAPHY;  Surgeon: Orbie Pyo, MD;  Location: [facility];  Service: Cardiovascular;  Laterality: N/A;   TONSILLECTOMY     TRANSCAROTID ARTERY REVASCULARIZATION  Right 02/15/2021   Procedure: RIGHT TRANSCAROTID ARTERY REVASCULARIZATION;  Surgeon: Cephus Shelling, MD;  Location: [facility] OR;  Service: Vascular;  Laterality: Right;   ULTRASOUND GUIDANCE FOR VASCULAR ACCESS Left 02/15/2021   Procedure: ULTRASOUND GUIDANCE FOR VASCULAR ACCESS;  Surgeon: Cephus Shelling, MD;  Location: [facility] OR;  Service: Vascular;  Laterality: Left;   XI ROBOTIC ASSISTED SIMPLE PROSTATECTOMY N/A 10/29/2018   Procedure: XI ROBOTIC ASSISTED SIMPLE PROSTATECTOMY;  Surgeon: Malen Gauze, MD;  Location: [facility];  Service: Urology;  Laterality: N/A;    Family History  Problem Relation Age of Onset   Stroke Father  Cirrhosis Mother    Colon cancer Neg Hx     Social History   Tobacco Use   Smoking status: Former    Packs/day: 0.50    Years: 40.00    Additional pack years: 0.00    Total pack years: 20.00    Types: Cigarettes    Quit date: 07/02/2021    Years since quitting: 1.1    Passive exposure: Never   Smokeless tobacco: Never   Tobacco comments:    Quit on 06/09/21  Vaping Use   Vaping Use: Never used  Substance Use Topics    Alcohol use: Not Currently   Drug use: Never    Medications: I have reviewed the patient's current medications. Allergies as of 08/10/2022   No Known Allergies      Medication List        Accurate as of August 10, 2022  3:17 PM. If you have any questions, ask your nurse or doctor.          acetaminophen 500 MG tablet Commonly known as: TYLENOL Take 1,000 mg by mouth daily as needed for fever, headache or moderate pain.   albuterol 108 (90 Base) MCG/ACT inhaler Commonly known as: VENTOLIN HFA Inhale 2 puffs into the lungs every 6 (six) hours as needed for wheezing or shortness of breath.   atorvastatin 40 MG tablet Commonly known as: LIPITOR Take 1 tablet (40 mg total) by mouth every evening.   Breztri Aerosphere 160-9-4.8 MCG/ACT Aero Generic drug: Budeson-Glycopyrrol-Formoterol Inhale 2 puffs into the lungs 2 (two) times daily.   carvedilol 6.25 MG tablet Commonly known as: Coreg Take 1 tablet (6.25 mg total) by mouth 2 (two) times daily with a meal.   clopidogrel 75 MG tablet Commonly known as: PLAVIX Take 1 tablet (75 mg total) by mouth daily.   furosemide 40 MG tablet Commonly known as: LASIX Take 1 tablet (40 mg total) by mouth 2 (two) times daily.   isosorbide mononitrate 60 MG 24 hr tablet Commonly known as: IMDUR Take 1 tablet (60 mg total) by mouth every evening.   potassium chloride SA 20 MEQ tablet Commonly known as: KLOR-CON M Take 1 tablet (20 mEq total) by mouth daily.         ROS:  A comprehensive review of systems was negative except for: Ears, nose, mouth, throat, and face: positive for prior basal cell removal Cardiovascular: positive for SOB and CHF  Blood pressure (!) 171/104, pulse 90, temperature 98.1 F (36.7 C), temperature source Oral, resp. rate 20, height 6' (1.829 m), weight 142 lb (64.4 kg), SpO2 97 %. Physical Exam Vitals reviewed.  HENT:     Head: Normocephalic.     Nose:     Comments:  ulcerated area on the bridge of  nose and missing right nares Eyes:     Comments: Right eye pulled open some from the scar from basal cell removal and flap  Cardiovascular:     Rate and Rhythm: Normal rate.  Pulmonary:     Effort: Pulmonary effort is normal.  Abdominal:     General: There is no distension.     Palpations: Abdomen is soft.     Tenderness: There is no abdominal tenderness.  Musculoskeletal:        General: No swelling.  Skin:    General: Skin is warm.  Neurological:     General: No focal deficit present.     Mental Status: Michael [name] is alert and oriented to person, place, and time.  Psychiatric:        Mood and Affect: Mood normal.        Behavior: Behavior normal.     Results: Reviewed Ct and [place] and showed to patient- large stone over 3 cm in the gallbladder, has been there since 2019 PET at least and unchanged    Assessment & Plan:  Michael [name] is a 67 y.o. male with no symptoms of gallbladder disease and a stable large gallstone. This could have been there for years. His LFTs were elevated but minimally and no biliary dilation. LFTs can also be elevated with some degree of heart failure. Michael [name] is getting lab work tomorrow with hematology and this will include a CMP they report.   Follow up the CMP from 08/11/22 Do not think surgery is the best option given worsening heart failure and stable gallstone  LFTs could be related to fatty liver, heart failure but I do not think are related to the gallbladder as the alk phos stayed normal   Information on symptoms and stones given to the patient.   All questions were answered to the satisfaction of the patient. Michael [name] will notify us if Michael [name] is having issues. Michael [name] would need cardiology risk stratification and pending that may not be able to have surgery at [facility]. Michael [name] would prefer it here if needed but hopes Michael [name]  does not need it.    Lucretia Roers 08/10/2022, 3:17 PM

## 2022-08-11 ENCOUNTER — Encounter: Payer: Self-pay | Admitting: Physician Assistant

## 2022-08-11 ENCOUNTER — Inpatient Hospital Stay: Payer: 59 | Admitting: Physician Assistant

## 2022-08-11 ENCOUNTER — Inpatient Hospital Stay (HOSPITAL_BASED_OUTPATIENT_CLINIC_OR_DEPARTMENT_OTHER): Payer: 59 | Admitting: Physician Assistant

## 2022-08-11 ENCOUNTER — Inpatient Hospital Stay: Payer: 59

## 2022-08-11 VITALS — BP 149/106 | HR 78 | Temp 97.5°F | Resp 18 | Wt 142.6 lb

## 2022-08-11 DIAGNOSIS — E559 Vitamin D deficiency, unspecified: Secondary | ICD-10-CM

## 2022-08-11 DIAGNOSIS — Z923 Personal history of irradiation: Secondary | ICD-10-CM | POA: Diagnosis not present

## 2022-08-11 DIAGNOSIS — Z85828 Personal history of other malignant neoplasm of skin: Secondary | ICD-10-CM | POA: Diagnosis not present

## 2022-08-11 DIAGNOSIS — N1832 Chronic kidney disease, stage 3b: Secondary | ICD-10-CM | POA: Diagnosis not present

## 2022-08-11 DIAGNOSIS — Z79899 Other long term (current) drug therapy: Secondary | ICD-10-CM | POA: Diagnosis not present

## 2022-08-11 DIAGNOSIS — Z87891 Personal history of nicotine dependence: Secondary | ICD-10-CM | POA: Diagnosis not present

## 2022-08-11 DIAGNOSIS — D500 Iron deficiency anemia secondary to blood loss (chronic): Secondary | ICD-10-CM

## 2022-08-11 DIAGNOSIS — D509 Iron deficiency anemia, unspecified: Secondary | ICD-10-CM | POA: Diagnosis not present

## 2022-08-11 DIAGNOSIS — Z7902 Long term (current) use of antithrombotics/antiplatelets: Secondary | ICD-10-CM | POA: Diagnosis not present

## 2022-08-11 DIAGNOSIS — D631 Anemia in chronic kidney disease: Secondary | ICD-10-CM | POA: Diagnosis not present

## 2022-08-11 DIAGNOSIS — I130 Hypertensive heart and chronic kidney disease with heart failure and stage 1 through stage 4 chronic kidney disease, or unspecified chronic kidney disease: Secondary | ICD-10-CM | POA: Diagnosis not present

## 2022-08-11 LAB — COMPREHENSIVE METABOLIC PANEL
ALT: 27 U/L (ref 0–44)
AST: 36 U/L (ref 15–41)
Albumin: 3.4 g/dL — ABNORMAL LOW (ref 3.5–5.0)
Alkaline Phosphatase: 134 U/L — ABNORMAL HIGH (ref 38–126)
Anion gap: 7 (ref 5–15)
BUN: 23 mg/dL (ref 8–23)
CO2: 26 mmol/L (ref 22–32)
Calcium: 8.8 mg/dL — ABNORMAL LOW (ref 8.9–10.3)
Chloride: 105 mmol/L (ref 98–111)
Creatinine, Ser: 1.84 mg/dL — ABNORMAL HIGH (ref 0.61–1.24)
GFR, Estimated: 39 mL/min — ABNORMAL LOW (ref >60)
Glucose, Bld: 138 mg/dL — ABNORMAL HIGH (ref 70–99)
Potassium: 3.8 mmol/L (ref 3.5–5.1)
Sodium: 138 mmol/L (ref 135–145)
Total Bilirubin: 1.3 mg/dL — ABNORMAL HIGH (ref 0.3–1.2)
Total Protein: 7.2 g/dL (ref 6.5–8.1)

## 2022-08-11 LAB — CBC WITH DIFFERENTIAL/PLATELET
Abs Immature Granulocytes: 0.02 10*3/uL (ref 0.00–0.07)
Basophils Absolute: 0.1 10*3/uL (ref 0.0–0.1)
Basophils Relative: 1 %
Eosinophils Absolute: 0.2 10*3/uL (ref 0.0–0.5)
Eosinophils Relative: 2 %
HCT: 38.5 % — ABNORMAL LOW (ref 39.0–52.0)
Hemoglobin: 12 g/dL — ABNORMAL LOW (ref 13.0–17.0)
Immature Granulocytes: 0 %
Lymphocytes Relative: 20 %
Lymphs Abs: 1.6 10*3/uL (ref 0.7–4.0)
MCH: 28.3 pg (ref 26.0–34.0)
MCHC: 31.2 g/dL (ref 30.0–36.0)
MCV: 90.8 fL (ref 80.0–100.0)
Monocytes Absolute: 0.6 10*3/uL (ref 0.1–1.0)
Monocytes Relative: 7 %
Neutro Abs: 5.7 10*3/uL (ref 1.7–7.7)
Neutrophils Relative %: 70 %
Platelets: 230 10*3/uL (ref 150–400)
RBC: 4.24 MIL/uL (ref 4.22–5.81)
RDW: 14.8 % (ref 11.5–15.5)
WBC: 8.2 10*3/uL (ref 4.0–10.5)
nRBC: 0 % (ref 0.0–0.2)

## 2022-08-11 LAB — FERRITIN: Ferritin: 52 ng/mL (ref 24–336)

## 2022-08-11 LAB — IRON AND TIBC
Iron: 58 ug/dL (ref 45–182)
Saturation Ratios: 13 % — ABNORMAL LOW (ref 17.9–39.5)
TIBC: 437 ug/dL (ref 250–450)
UIBC: 379 ug/dL

## 2022-08-11 LAB — VITAMIN D 25 HYDROXY (VIT D DEFICIENCY, FRACTURES): Vit D, 25-Hydroxy: 32.63 ng/mL (ref 30–100)

## 2022-08-11 NOTE — Progress Notes (Signed)
LFTs overall improved with AST and ALT normal, T bili down, and Alk phos just up slightly. Since he is not symptomatic with any gallstone symptoms, I would still  monitor things for now.

## 2022-08-12 DIAGNOSIS — N1832 Chronic kidney disease, stage 3b: Secondary | ICD-10-CM | POA: Diagnosis not present

## 2022-08-12 DIAGNOSIS — J9601 Acute respiratory failure with hypoxia: Secondary | ICD-10-CM | POA: Diagnosis not present

## 2022-08-12 DIAGNOSIS — J441 Chronic obstructive pulmonary disease with (acute) exacerbation: Secondary | ICD-10-CM | POA: Diagnosis not present

## 2022-08-12 DIAGNOSIS — N179 Acute kidney failure, unspecified: Secondary | ICD-10-CM | POA: Diagnosis not present

## 2022-08-12 DIAGNOSIS — I130 Hypertensive heart and chronic kidney disease with heart failure and stage 1 through stage 4 chronic kidney disease, or unspecified chronic kidney disease: Secondary | ICD-10-CM | POA: Diagnosis not present

## 2022-08-12 DIAGNOSIS — I5043 Acute on chronic combined systolic (congestive) and diastolic (congestive) heart failure: Secondary | ICD-10-CM | POA: Diagnosis not present

## 2022-08-15 ENCOUNTER — Encounter (HOSPITAL_COMMUNITY): Payer: Self-pay | Admitting: Emergency Medicine

## 2022-08-15 ENCOUNTER — Encounter: Payer: Self-pay | Admitting: Nurse Practitioner

## 2022-08-15 ENCOUNTER — Emergency Department (HOSPITAL_COMMUNITY): Payer: 59

## 2022-08-15 ENCOUNTER — Inpatient Hospital Stay (HOSPITAL_COMMUNITY)
Admission: EM | Admit: 2022-08-15 | Discharge: 2022-08-18 | DRG: 291 | Disposition: A | Discharge status: Home Health | Payer: 59 | Attending: Family Medicine | Admitting: Family Medicine

## 2022-08-15 ENCOUNTER — Ambulatory Visit: Payer: 59 | Attending: Nurse Practitioner | Admitting: Nurse Practitioner

## 2022-08-15 ENCOUNTER — Other Ambulatory Visit: Payer: Self-pay

## 2022-08-15 VITALS — BP 130/82 | HR 90 | Wt 144.6 lb

## 2022-08-15 DIAGNOSIS — Z7902 Long term (current) use of antithrombotics/antiplatelets: Secondary | ICD-10-CM

## 2022-08-15 DIAGNOSIS — Z79899 Other long term (current) drug therapy: Secondary | ICD-10-CM

## 2022-08-15 DIAGNOSIS — Z5982 Transportation insecurity: Secondary | ICD-10-CM

## 2022-08-15 DIAGNOSIS — Z515 Encounter for palliative care: Secondary | ICD-10-CM | POA: Diagnosis not present

## 2022-08-15 DIAGNOSIS — Z7189 Other specified counseling: Secondary | ICD-10-CM | POA: Diagnosis not present

## 2022-08-15 DIAGNOSIS — N179 Acute kidney failure, unspecified: Secondary | ICD-10-CM | POA: Diagnosis not present

## 2022-08-15 DIAGNOSIS — I493 Ventricular premature depolarization: Secondary | ICD-10-CM | POA: Diagnosis not present

## 2022-08-15 DIAGNOSIS — I5021 Acute systolic (congestive) heart failure: Secondary | ICD-10-CM | POA: Diagnosis not present

## 2022-08-15 DIAGNOSIS — I428 Other cardiomyopathies: Secondary | ICD-10-CM | POA: Diagnosis not present

## 2022-08-15 DIAGNOSIS — I472 Ventricular tachycardia, unspecified: Secondary | ICD-10-CM | POA: Diagnosis not present

## 2022-08-15 DIAGNOSIS — Z85828 Personal history of other malignant neoplasm of skin: Secondary | ICD-10-CM

## 2022-08-15 DIAGNOSIS — I700 Atherosclerosis of aorta: Secondary | ICD-10-CM | POA: Diagnosis not present

## 2022-08-15 DIAGNOSIS — R918 Other nonspecific abnormal finding of lung field: Secondary | ICD-10-CM | POA: Diagnosis not present

## 2022-08-15 DIAGNOSIS — Z5900 Homelessness unspecified: Secondary | ICD-10-CM | POA: Diagnosis not present

## 2022-08-15 DIAGNOSIS — Z8589 Personal history of malignant neoplasm of other organs and systems: Secondary | ICD-10-CM

## 2022-08-15 DIAGNOSIS — Z9049 Acquired absence of other specified parts of digestive tract: Secondary | ICD-10-CM

## 2022-08-15 DIAGNOSIS — Z66 Do not resuscitate: Secondary | ICD-10-CM | POA: Diagnosis present

## 2022-08-15 DIAGNOSIS — I5023 Acute on chronic systolic (congestive) heart failure: Principal | ICD-10-CM

## 2022-08-15 DIAGNOSIS — I2510 Atherosclerotic heart disease of native coronary artery without angina pectoris: Secondary | ICD-10-CM | POA: Diagnosis present

## 2022-08-15 DIAGNOSIS — R54 Age-related physical debility: Secondary | ICD-10-CM | POA: Diagnosis present

## 2022-08-15 DIAGNOSIS — Z59819 Housing instability, housed unspecified: Secondary | ICD-10-CM | POA: Diagnosis not present

## 2022-08-15 DIAGNOSIS — T502X5A Adverse effect of carbonic-anhydrase inhibitors, benzothiadiazides and other diuretics, initial encounter: Secondary | ICD-10-CM | POA: Diagnosis not present

## 2022-08-15 DIAGNOSIS — I10 Essential (primary) hypertension: Secondary | ICD-10-CM | POA: Diagnosis not present

## 2022-08-15 DIAGNOSIS — R0602 Shortness of breath: Secondary | ICD-10-CM | POA: Diagnosis not present

## 2022-08-15 DIAGNOSIS — Z87891 Personal history of nicotine dependence: Secondary | ICD-10-CM | POA: Diagnosis not present

## 2022-08-15 DIAGNOSIS — J441 Chronic obstructive pulmonary disease with (acute) exacerbation: Secondary | ICD-10-CM | POA: Diagnosis not present

## 2022-08-15 DIAGNOSIS — N189 Chronic kidney disease, unspecified: Secondary | ICD-10-CM | POA: Diagnosis present

## 2022-08-15 DIAGNOSIS — I6523 Occlusion and stenosis of bilateral carotid arteries: Secondary | ICD-10-CM

## 2022-08-15 DIAGNOSIS — I110 Hypertensive heart disease with heart failure: Secondary | ICD-10-CM | POA: Diagnosis not present

## 2022-08-15 DIAGNOSIS — E876 Hypokalemia: Secondary | ICD-10-CM | POA: Diagnosis present

## 2022-08-15 DIAGNOSIS — J449 Chronic obstructive pulmonary disease, unspecified: Secondary | ICD-10-CM

## 2022-08-15 DIAGNOSIS — I951 Orthostatic hypotension: Secondary | ICD-10-CM | POA: Diagnosis present

## 2022-08-15 DIAGNOSIS — I440 Atrioventricular block, first degree: Secondary | ICD-10-CM | POA: Diagnosis not present

## 2022-08-15 DIAGNOSIS — E782 Mixed hyperlipidemia: Secondary | ICD-10-CM | POA: Diagnosis present

## 2022-08-15 DIAGNOSIS — I130 Hypertensive heart and chronic kidney disease with heart failure and stage 1 through stage 4 chronic kidney disease, or unspecified chronic kidney disease: Secondary | ICD-10-CM | POA: Diagnosis not present

## 2022-08-15 DIAGNOSIS — N1832 Chronic kidney disease, stage 3b: Secondary | ICD-10-CM | POA: Diagnosis present

## 2022-08-15 DIAGNOSIS — I5043 Acute on chronic combined systolic (congestive) and diastolic (congestive) heart failure: Secondary | ICD-10-CM | POA: Diagnosis not present

## 2022-08-15 DIAGNOSIS — I517 Cardiomegaly: Secondary | ICD-10-CM | POA: Diagnosis not present

## 2022-08-15 DIAGNOSIS — R9431 Abnormal electrocardiogram [ECG] [EKG]: Secondary | ICD-10-CM | POA: Diagnosis present

## 2022-08-15 DIAGNOSIS — Z823 Family history of stroke: Secondary | ICD-10-CM

## 2022-08-15 DIAGNOSIS — E785 Hyperlipidemia, unspecified: Secondary | ICD-10-CM

## 2022-08-15 DIAGNOSIS — Y92239 Unspecified place in hospital as the place of occurrence of the external cause: Secondary | ICD-10-CM | POA: Diagnosis not present

## 2022-08-15 LAB — PROTIME-INR
INR: 1.2 (ref 0.8–1.2)
Prothrombin Time: 15.3 seconds — ABNORMAL HIGH (ref 11.4–15.2)

## 2022-08-15 LAB — BASIC METABOLIC PANEL
Anion gap: 8 (ref 5–15)
BUN: 24 mg/dL — ABNORMAL HIGH (ref 8–23)
CO2: 26 mmol/L (ref 22–32)
Calcium: 8.9 mg/dL (ref 8.9–10.3)
Chloride: 106 mmol/L (ref 98–111)
Creatinine, Ser: 2.05 mg/dL — ABNORMAL HIGH (ref 0.61–1.24)
GFR, Estimated: 35 mL/min — ABNORMAL LOW (ref >60)
Glucose, Bld: 106 mg/dL — ABNORMAL HIGH (ref 70–99)
Potassium: 3.7 mmol/L (ref 3.5–5.1)
Sodium: 140 mmol/L (ref 135–145)

## 2022-08-15 LAB — CBC
HCT: 37.7 % — ABNORMAL LOW (ref 39.0–52.0)
Hemoglobin: 11.6 g/dL — ABNORMAL LOW (ref 13.0–17.0)
MCH: 28 pg (ref 26.0–34.0)
MCHC: 30.8 g/dL (ref 30.0–36.0)
MCV: 90.8 fL (ref 80.0–100.0)
Platelets: 240 10*3/uL (ref 150–400)
RBC: 4.15 MIL/uL — ABNORMAL LOW (ref 4.22–5.81)
RDW: 14.8 % (ref 11.5–15.5)
WBC: 7 10*3/uL (ref 4.0–10.5)
nRBC: 0 % (ref 0.0–0.2)

## 2022-08-15 LAB — BRAIN NATRIURETIC PEPTIDE: B Natriuretic Peptide: >4500 pg/mL — ABNORMAL HIGH (ref 0.0–100.0)

## 2022-08-15 MED ORDER — PREDNISONE 20 MG PO TABS
40.0000 mg | ORAL_TABLET | Freq: Once | ORAL | Status: AC
  Administered 2022-08-15: 40 mg via ORAL
  Filled 2022-08-15: qty 2

## 2022-08-15 MED ORDER — FUROSEMIDE 10 MG/ML IJ SOLN: 60.0000 mg | Freq: Two times a day (BID) | INTRAMUSCULAR | Status: DC

## 2022-08-15 MED ORDER — ENOXAPARIN SODIUM 40 MG/0.4ML IJ SOSY
40.0000 mg | PREFILLED_SYRINGE | INTRAMUSCULAR | Status: DC
  Administered 2022-08-15 – 2022-08-17 (×3): 40 mg via SUBCUTANEOUS
  Filled 2022-08-15 (×3): qty 0.4

## 2022-08-15 MED ORDER — PROCHLORPERAZINE EDISYLATE 10 MG/2ML IJ SOLN: 10.0000 mg | Freq: Four times a day (QID) | INTRAMUSCULAR | Status: DC | PRN

## 2022-08-15 MED ORDER — FUROSEMIDE 10 MG/ML IJ SOLN
40.0000 mg | Freq: Two times a day (BID) | INTRAMUSCULAR | Status: DC
  Administered 2022-08-16 – 2022-08-17 (×3): 40 mg via INTRAVENOUS
  Filled 2022-08-15 (×3): qty 4

## 2022-08-15 MED ORDER — FUROSEMIDE 10 MG/ML IJ SOLN: 40.0000 mg | Freq: Two times a day (BID) | INTRAMUSCULAR | Status: DC

## 2022-08-15 MED ORDER — ACETAMINOPHEN 650 MG RE SUPP: 650.0000 mg | Freq: Four times a day (QID) | RECTAL | Status: DC | PRN

## 2022-08-15 MED ORDER — ACETAMINOPHEN 325 MG PO TABS: 650.0000 mg | ORAL_TABLET | Freq: Four times a day (QID) | ORAL | Status: DC | PRN

## 2022-08-15 MED ORDER — IPRATROPIUM-ALBUTEROL 0.5-2.5 (3) MG/3ML IN SOLN
3.0000 mL | Freq: Once | RESPIRATORY_TRACT | Status: AC
  Administered 2022-08-15: 3 mL via RESPIRATORY_TRACT
  Filled 2022-08-15: qty 3

## 2022-08-15 MED ORDER — FUROSEMIDE 10 MG/ML IJ SOLN
80.0000 mg | Freq: Once | INTRAMUSCULAR | Status: AC
  Administered 2022-08-15: 80 mg via INTRAVENOUS
  Filled 2022-08-15: qty 8

## 2022-08-15 NOTE — Patient Instructions (Addendum)
Medication Instructions:  Your physician recommends that you continue on your current medications as directed. Please refer to the Current Medication list given to you today.  Labwork: none  Testing/Procedures: none  Follow-Up: Your physician recommends that you schedule a follow-up appointment in: 1-2 weeks with Philis Nettle   Any Other Special Instructions Will Be Listed Below (If Applicable).  SENT TO [facility]   If you need a refill on your cardiac medications before your next appointment, please call your pharmacy.

## 2022-08-15 NOTE — ED Triage Notes (Addendum)
Pt via POV sent by Dr. Sharlene Dory for SOB related to heart failure, started earlier this morning. He was told to expect admission. No pain. Pt has mild swelling to bilateral feet and ankles.

## 2022-08-15 NOTE — ED Provider Notes (Signed)
[place] EMERGENCY DEPARTMENT AT [facility] Provider Note   CSN: [id number] Arrival date & time: 08/15/22  1442     History  Chief Complaint  Patient presents with   Shortness of Breath    Michael [name] is a 67 y.o. male. He has PMH of [id number]% on echo last month, COPD, CKD, HTN, high cholesterol.  Presents the ER today complaining of shortness of breath and bilateral leg swelling worse since discharged 2 weeks ago for COPD and CHF exacerbation at that time.  He reports he been compliant with his Lasix but did not take it today due to his doctor's appointment with cardiology.  He was seen in cardiology office noted to be short of breath while sitting and was noted to have a 10 pound weight gain over the past 2 weeks, he was advised to come to the ER for evaluation and likely admission.  Denies chest pain, no cough, no fevers   Shortness of Breath      Home Medications Prior to Admission medications   Medication Sig Start Date End Date Taking? Authorizing Provider  acetaminophen (TYLENOL) 500 MG tablet Take 1,000 mg by mouth daily as needed for fever, headache or moderate pain.    [provider]  albuterol (VENTOLIN HFA) 108 (90 Base) MCG/ACT inhaler Inhale 2 puffs into the lungs every 6 (six) hours as needed for wheezing or shortness of breath. 04/27/22   Shahmehdi, Gemma Payor, MD  atorvastatin (LIPITOR) 40 MG tablet Take 1 tablet (40 mg total) by mouth every evening. 08/01/22 08/31/22  Shahmehdi, Gemma Payor, MD  Budeson-Glycopyrrol-Formoterol (BREZTRI AEROSPHERE) 160-9-4.8 MCG/ACT AERO Inhale 2 puffs into the lungs 2 (two) times daily.    [provider]  carvedilol (COREG) 6.25 MG tablet Take 1 tablet (6.25 mg total) by mouth 2 (two) times daily with a meal. 07/26/22 08/25/22  Shahmehdi, Gemma Payor, MD  clopidogrel (PLAVIX) 75 MG tablet Take 1 tablet (75 mg total) by mouth daily. 05/13/22   Strader, Lennart Pall, PA-C  furosemide (LASIX) 40 MG tablet Take 1  tablet (40 mg total) by mouth 2 (two) times daily. 08/01/22   Catarina Hartshorn, MD  isosorbide mononitrate (IMDUR) 60 MG 24 hr tablet Take 1 tablet (60 mg total) by mouth every evening. 05/13/22   Strader, Lennart Pall, PA-C  potassium chloride SA (KLOR-CON M) 20 MEQ tablet Take 1 tablet (20 mEq total) by mouth daily. 07/26/22 08/25/22  Kendell Bane, MD      Allergies    Patient has no known allergies.    Review of Systems   Review of Systems  Respiratory:  Positive for shortness of breath.     Physical Exam Updated Vital Signs BP (!) 172/126   Pulse 75   Temp 97.8 F (36.6 C) (Oral)   Resp (!) 24   Ht 6' (1.829 m)   Wt 65.8 kg   SpO2 93%   BMI 19.67 kg/m  Physical Exam Vitals and nursing note reviewed.  Constitutional:      General: He is not in acute distress.    Appearance: He is well-developed.  HENT:     Head: Normocephalic and atraumatic.     Mouth/Throat:     Mouth: Mucous membranes are moist.  Eyes:     Conjunctiva/sclera: Conjunctivae normal.  Cardiovascular:     Rate and Rhythm: Normal rate and regular rhythm.     Heart sounds: No murmur heard. Pulmonary:     Effort: Pulmonary effort is normal. No  respiratory distress.     Breath sounds: Examination of the left-lower field reveals rales. Wheezing and rales present.  Abdominal:     Palpations: Abdomen is soft.     Tenderness: There is no abdominal tenderness.  Musculoskeletal:        General: No swelling. Normal range of motion.     Cervical back: Neck supple.     Right lower leg: Edema present.     Left lower leg: Edema present.  Skin:    General: Skin is warm and dry.     Capillary Refill: Capillary refill takes less than 2 seconds.  Neurological:     General: No focal deficit present.     Mental Status: He is alert.  Psychiatric:        Mood and Affect: Mood normal.     ED Results / Procedures / Treatments   Labs (all labs ordered are listed, but only abnormal results are displayed) Labs Reviewed   BASIC METABOLIC PANEL - Abnormal; Notable for the following components:      Result Value   Glucose, Bld 106 (*)    BUN 24 (*)    Creatinine, Ser 2.05 (*)    GFR, Estimated 35 (*)    All other components within normal limits  CBC - Abnormal; Notable for the following components:   RBC 4.15 (*)    Hemoglobin 11.6 (*)    HCT 37.7 (*)    All other components within normal limits  PROTIME-INR - Abnormal; Notable for the following components:   Prothrombin Time 15.3 (*)    All other components within normal limits  BRAIN NATRIURETIC PEPTIDE - Abnormal; Notable for the following components:   B Natriuretic Peptide >4,500.0 (*)    All other components within normal limits    EKG EKG Interpretation Date/Time:  Monday August 15 2022 14:59:03 EDT Ventricular Rate:  93 PR Interval:  252 QRS Duration:  126 QT Interval:  412 QTC Calculation: 512 R Axis:   57  Text Interpretation: Sinus rhythm with 1st degree A-V block with frequent Premature ventricular complexes Left ventricular hypertrophy with QRS widening and repolarization abnormality ( Sokolow-Lyon , Cornell product ) Cannot rule out Anteroseptal infarct (cited on or before 28-Jul-2022) Abnormal ECG When compared with ECG of 28-Jul-2022 17:33, Premature ventricular complexes are now Present Serial changes of Anteroseptal infarct Present Confirmed by Meridee Score [phone]) on 08/15/2022 3:01:41 PM  Radiology DG Chest 2 View  Result Date: 08/15/2022 CLINICAL DATA:  Shortness of breath EXAM: CHEST - 2 VIEW COMPARISON:  Previous studies including the examination of 07/28/2022 FINDINGS: Transverse diameter of heart is increased. Increase in AP diameter of chest suggests COPD. Central pulmonary vessels are prominent without signs of alveolar pulmonary edema. There is partial clearing of patchy infiltrates in right lower lung field. Right cardiac margin is better visualized in the current study. There are no new focal infiltrates. There is no  pleural effusion or pneumothorax. IMPRESSION: There is partial clearing of infiltrates in right lower lung field suggesting resolving atelectasis/pneumonia. COPD. Cardiomegaly. Central pulmonary vessels are prominent, possibly suggesting mild CHF. Electronically Signed   By: Ernie Avena M.D.   On: 08/15/2022 15:58    Procedures Procedures    Medications Ordered in ED Medications  furosemide (LASIX) injection 80 mg (80 mg Intravenous Given 08/15/22 1822)  ipratropium-albuterol (DUONEB) 0.5-2.5 (3) MG/3ML nebulizer solution 3 mL (3 mLs Nebulization Given 08/15/22 1805)  predniSONE (DELTASONE) tablet 40 mg (40 mg Oral Given 08/15/22 1822)  ED Course/ Medical Decision Making/ A&P                             Medical Decision Making This patient presents to the ED for concern of worsening shortness of breath, 10 pound weight gain, this involves an extensive number of treatment options, and is a complaint that carries with it a high risk of complications and morbidity.  The differential diagnosis includes heart failure exacerbation, PE, COPD exacerbation, pneumonia, other   Co morbidities that complicate the patient evaluation :   Heart failure, COPD   Additional history obtained:  Additional history obtained from EMR External records from outside source obtained and reviewed including notes including cardiology note from today   Lab Tests:  I Ordered, and personally interpreted labs.  The pertinent results include: BNP greater than 4500, renal function is around his baseline BC shows hemoglobin around his baseline   Imaging Studies ordered:  I ordered imaging studies including chest x-ray I independently visualized and interpreted imaging which showed cardiomegaly, mild CHF I agree with the radiologist interpretation   Cardiac Monitoring: / EKG:  The patient was maintained on a cardiac monitor.  I personally viewed and interpreted the cardiac monitored which showed an  underlying rhythm of: Sinus rhythm with frequent PVCs   Consultations Obtained:  I requested consultation with the hospitalist, Dr. Thomes Dinning,  and discussed lab and imaging findings as well as pertinent plan - they recommend: admission   Problem List / ED Course / Critical interventions / Medication management  Patient had worsening shortness of breath for the past 2 weeks since leaving hospital for his heart failure exacerbation, he has some rales in his left lung base and also having some mild wheezing diffusely.  He was obviously short of breath and sitting straight up on initial exam.  Wheezing resolved and breathing slightly easier after a DuoNeb the patient states he still feels short of breath.  Given 80 IV of Lasix, patient only diuresed a small amount of urine so far.  Discussed with hospitalist as above for admission  I have reviewed the patients home medicines and have made adjustments as needed       Amount and/or Complexity of Data Reviewed Labs: ordered. Radiology: ordered.  Risk Prescription drug management. Decision regarding hospitalization.           Final Clinical Impression(s) / ED Diagnoses Final diagnoses:  Acute on chronic systolic congestive heart failure (HCC)  Chronic obstructive pulmonary disease, unspecified COPD type [facility])    Rx / DC Orders ED Discharge Orders     None         Josem Kaufmann 08/15/22 1936    Terrilee Files, MD 08/16/22 [id number]

## 2022-08-15 NOTE — Progress Notes (Addendum)
Cardiology Office Note:  .   Date:  08/15/2022  ID:  Michael [name], DOB 1955-11-30, MRN [id number] PCP: Benita Stabile, MD  [facility] Providers Cardiologist:  Nona Dell, MD    History of Present Illness: .   Michael [name] is a 67 y.o. male with PMH of HFrEF, CAD, carotid artery stenosis, s/p right TCAR in January 2023, IDA, hyperlipidemia, hypertension, IDA, CKD stage III, pulmonary nodules, and COPD, who presents today for scheduled follow-up.  History of multiple admissions over the past few months for CHF exacerbation and previous syncopal episode.  Recurrent presentations due to homelessness, was not taking medications regularly.  Admitted April 2024 for acute dyspnea in setting of acute CHF and COPD exacerbation.  Not discharged on diuretic therapy due to orthostasis.  Carvedilol discontinued.  Last seen by Michael An, PA-C on May 13, 2022.  Stated he was doing well.  Noted baseline DOE.  Denied any syncopal episodes.  After office visit, was diagnosed with pneumonia.   Admitted June 2024 with acute on chronic systolic CHF.  Medications adjusted.  Not felt to be candidate for Entresto/SGLT2 inhibitors/ACEi/ARB due to low GFR, acute on chronic CKD during hospitalization.  Recommended considering low-dose spironolactone as outpatient.  Cardiology recommended holding statins due to elevated LFTs.  Last seen for office follow-up on July 28, 2022. Said he felt "lousy," admitted to progressive shortness of breath, noticed symptoms at rest. Reported compliance with medications. Was sent to ED for further evaluation and admitted for dyspnea and acute on chronic HFrEF, EF 25-30% from 35-40%, found to have elevated troponin secondary to demand ischemia.   Today he presents for follow-up. He states he feels awful after leaving the hospital and does not feel better.  Continues to remain short of breath with exertion but appears very short of breath at rest during visit today.  Denies any chest pain, palpitations, syncope, presyncope, dizziness, orthopnea, PND, significant weight changes, acute bleeding, or claudication.  It appears his weight is up around 10 pounds since hospital discharge.  Admits to new onset of left lower extremity swelling.    Studies Reviewed: .    Echo 07/2022:  1. Left ventricular ejection fraction, by estimation, is 25 to 30%. The  left ventricle has severely decreased function. The left ventricle  demonstrates global hypokinesis. There is mild left ventricular  hypertrophy. Left ventricular diastolic parameters   are consistent with Grade III diastolic dysfunction (restrictive).   2. Right ventricular systolic function is mildly reduced. The right  ventricular size is normal. Tricuspid regurgitation signal is inadequate  for assessing PA pressure.   3. Left atrial size was severely dilated.   4. Right atrial size was mildly dilated.   5. The mitral valve is grossly normal. Mild mitral valve regurgitation.  No evidence of mitral stenosis.   6. The aortic valve is tricuspid. Aortic valve regurgitation is trivial.  No aortic stenosis is present.   7. The inferior vena cava is normal in size with <50% respiratory  variability, suggesting right atrial pressure of 8 mmHg.   Comparison(s): Changes from prior study are noted. The left ventricular  function is worsened.  Echocardiogram 04/2022: 1. Left ventricular ejection fraction, by estimation, is 35 to 40%. The  left ventricle has moderately decreased function. The left ventricle  demonstrates global hypokinesis. Left ventricular diastolic parameters are  indeterminate.   2. Right ventricular systolic function is normal. The right ventricular  size is normal. There is normal pulmonary artery systolic pressure.  3. The mitral valve is normal in structure. No evidence of mitral valve  regurgitation. No evidence of mitral stenosis.   4. The aortic valve has [name] indeterminant number of  cusps. There is mild  calcification of the aortic valve. There is mild thickening of the aortic  valve. Aortic valve regurgitation is not visualized. No aortic stenosis is  present.   5. The inferior vena cava is normal in size with greater than 50%  respiratory variability, suggesting right atrial pressure of 3 mmHg.  Right and left heart cath 10/2021:  Mid RCA lesion is 70% stenosed.   Acute Mrg lesion is 80% stenosed.   1.  Mild to moderate obstructive coronary artery disease that is outweighed by the patient's degree of LV dysfunction.  There is a significant bifurcation lesion of the right coronary artery and large RV marginal branch.  This is moderately calcified and relatively tortuous.  Given the patient's lack of chest pain, medical therapy should be pursued. 2.  Cardiac output of 6.3 L/min, cardiac index of 3.3 L/min/m, mean RA pressure of 16 mmHg, mean wedge pressure of 21 mmHg, and LVEDP of 21 mmHg.   Recommendation: Goal-directed medical therapy with augmentation of diuretics; consider dual antiplatelet therapy for 1 year for medical treatment of acute coronary syndrome.  Physical Exam:   VS:  BP 130/82   Pulse 90   Wt 144 lb 9.6 oz (65.6 kg)   SpO2 90%   BMI 19.61 kg/m    Wt Readings from Last 3 Encounters:  08/15/22 144 lb 9.6 oz (65.6 kg)  08/11/22 142 lb 10.2 oz (64.7 kg)  08/10/22 142 lb (64.4 kg)    GEN: Well nourished, well developed in acute distress (short of breath at rest). NECK: No JVD; No carotid bruits CARDIAC: S1/S2, RRR, no murmurs, rubs, gallops RESPIRATORY:  Clear and diminished, to auscultation without rales, wheezing or rhonchi, tachypnea, mostly in tripod position during exam EXTREMITIES:  BLE with trace edema (LLE >RLE) ; Facial deformity along center of face with black eschar tissue along nasal bridge, otherwise normal  ASSESSMENT AND PLAN: .    1.  Shortness of breath 2.  HFrEF 3. COPD Pt presents with evidence of volume overload and CC of   shortness of breath, evidence at rest. EF worsened to 25- 30% from 35-40%. Recommend ED evaluation.  Offered EMS transportation, patient refused.  Stated friend will transfer him to ED.  Will refer him to palliative care as I believe he is in end stage of CHF, pt agreeable and verbalized understanding, have also referred him to pulmonology for follow-up post d/c from hospital. No medication changes at this time.   4. CAD Stable with no anginal symptoms. Recommended ED evaluation at [facility] as mentioned above. No medication changes as mentioned above.   5. Carotid artery stenosis, s/p R TCAR 01/2021, HLD Denies any symptoms.  Bilateral 1 to 39% stenosis along ICAs.  No medication changes at this time.  Continue to follow-up with [facility].  As mentioned above, recommend ED evaluation.  Dispo: Follow-up with me or [facility] in 1-2 weeks post discharge.   Signed, Sharlene Dory, NP

## 2022-08-15 NOTE — ED Notes (Signed)
ED TO INPATIENT HANDOFF REPORT  ED Nurse Name and Phone #: Carlton Adam, RN [phone]  S Name/Age/Gender Michael [name] 67 y.o. male Room/Bed: APA06/APA06  Code Status   Code Status: Prior  Home/SNF/Other Home Patient oriented to: self, place, time, and situation Is this baseline? Yes   Triage Complete: Triage complete  Chief Complaint Acute on chronic systolic CHF (congestive heart failure) (HCC) [I50.23]  Triage Note Pt via POV sent by Dr. Sharlene Dory for SOB related to heart failure, started earlier this morning. He was told to expect admission. No pain. Pt has mild swelling to bilateral feet and ankles.    Allergies No Known Allergies  Level of Care/Admitting Diagnosis ED Disposition     ED Disposition  Admit   Condition  --   Comment  Hospital Area: [facility] [[id number]]  Level of Care: Telemetry [5]  Covid Evaluation: Asymptomatic - no recent exposure (last 10 days) testing not required  Diagnosis: Acute on chronic systolic CHF (congestive heart failure) [facility]) [[id number]]  Admitting Physician: Frankey Shown [[id number]]  Attending Physician: Frankey Shown [[id number]]  Certification:: I certify this patient will need inpatient services for at least 2 midnights  Estimated Length of Stay: 3          B Medical/Surgery History Past Medical History:  Diagnosis Date   Aortic atherosclerosis (HCC) 08/03/2021   CAD (coronary artery disease) 08/03/2021   Cardiac catheterization September 2023 with RCA/RV marginal stenosis managed medically   Cardiomyopathy (HCC)    Carotid artery disease (HCC)    CKD (chronic kidney disease) stage 3, GFR 30-59 ml/min (HCC)    COPD (chronic obstructive pulmonary disease) (HCC)    Essential hypertension    Head and neck cancer (HCC) 2019   Right facial basal cell carcinoma s/p resection with right partial mastectomy and partal rhinectomy with skin graft (06/13/17)   Iron deficiency anemia    Urinary retention     Past Surgical History:  Procedure Laterality Date   ANKLE CLOSED REDUCTION Right    open reduction   BASAL CELL CARCINOMA EXCISION  2019   at [facility]   BIOPSY  07/19/2021   Procedure: BIOPSY;  Surgeon: Lanelle Bal, DO;  Location: [facility];  Service: Endoscopy;;   COLONOSCOPY N/A 05/31/2017   Procedure: COLONOSCOPY;  Surgeon: Corbin Ade, MD;  Location: [facility];  Service: Endoscopy;  Laterality: N/A;  2:45pm   COLONOSCOPY WITH PROPOFOL N/A 07/19/2021   Procedure: COLONOSCOPY WITH PROPOFOL;  Surgeon: Lanelle Bal, DO;  Location: [facility];  Service: Endoscopy;  Laterality: N/A;  3:00pm, moved up to 9:00   CYSTOSCOPY N/A 10/29/2018   Procedure: CYSTOSCOPY, CLOT EVACUATION;  Surgeon: Rene Paci, MD;  Location: [facility];  Service: Urology;  Laterality: N/A;   PARTIAL COLECTOMY Right 08/11/2021   Procedure: PARTIAL COLECTOMY, OPEN RIGHT HEMICOLECTOMY;  Surgeon: Lucretia Roers, MD;  Location: [facility];  Service: General;  Laterality: Right;   POLYPECTOMY  05/31/2017   Procedure: POLYPECTOMY;  Surgeon: Corbin Ade, MD;  Location: [facility];  Service: Endoscopy;;   RIGHT/LEFT HEART CATH AND CORONARY ANGIOGRAPHY N/A 10/22/2021   Procedure: RIGHT/LEFT HEART CATH AND CORONARY ANGIOGRAPHY;  Surgeon: Orbie Pyo, MD;  Location: [facility];  Service: Cardiovascular;  Laterality: N/A;   TONSILLECTOMY     TRANSCAROTID ARTERY REVASCULARIZATION  Right 02/15/2021   Procedure: RIGHT TRANSCAROTID ARTERY REVASCULARIZATION;  Surgeon: Cephus Shelling, MD;  Location: [facility] OR;  Service: Vascular;  Laterality: Right;   ULTRASOUND GUIDANCE FOR VASCULAR ACCESS Left 02/15/2021   Procedure: ULTRASOUND GUIDANCE FOR VASCULAR ACCESS;  Surgeon: Cephus Shelling, MD;  Location[facility] OR;  Service: Vascular;  Laterality: Left;   XI ROBOTIC ASSISTED SIMPLE PROSTATECTOMY N/A 10/29/2018   Procedure: XI ROBOTIC ASSISTED SIMPLE PROSTATECTOMY;  Surgeon: Malen Gauze,  MD;  Location: [facility];  Service: Urology;  Laterality: N/A;     A IV Location/Drains/Wounds Patient Lines/Drains/Airways Status     Active Line/Drains/Airways     Name Placement date Placement time Site Days   Peripheral IV 08/15/22 20 G 1" Anterior;Right Forearm 08/15/22  1818  Forearm  less than 1            Intake/Output Last 24 hours  Intake/Output Summary (Last 24 hours) at 08/15/2022 1954 Last data filed at 08/15/2022 1919 Gross per 24 hour  Intake --  Output 225 ml  Net -225 ml    Labs/Imaging Results for orders placed or performed during the hospital encounter of 08/15/22 (from the past 48 hour(s))  Basic metabolic panel     Status: Abnormal   Collection Time: 08/15/22  3:13 PM  Result Value Ref Range   Sodium 140 135 - 145 mmol/L   Potassium 3.7 3.5 - 5.1 mmol/L   Chloride 106 98 - 111 mmol/L   CO2 26 22 - 32 mmol/L   Glucose, Bld 106 (H) 70 - 99 mg/dL    Comment: Glucose reference range applies only to samples taken after fasting for at least 8 hours.   BUN 24 (H) 8 - 23 mg/dL   Creatinine, Ser 1.61 (H) 0.61 - 1.24 mg/dL   Calcium 8.9 8.9 - 09.6 mg/dL   GFR, Estimated 35 (L) >60 mL/min    Comment: (NOTE) Calculated using the CKD-EPI Creatinine Equation (2021)    Anion gap 8 5 - 15    Comment: Performed at [facility], [address], [place], [place] [address]  CBC     Status: Abnormal   Collection Time: 08/15/22  3:13 PM  Result Value Ref Range   WBC 7.0 4.0 - 10.5 K/uL   RBC 4.15 (L) 4.22 - 5.81 MIL/uL   Hemoglobin 11.6 (L) 13.0 - 17.0 g/dL   HCT 98.1 (L) 19.1 - 47.8 %   MCV 90.8 80.0 - 100.0 fL   MCH 28.0 26.0 - 34.0 pg   MCHC 30.8 30.0 - 36.0 g/dL   RDW 29.5 62.1 - 30.8 %   Platelets 240 150 - 400 K/uL   nRBC 0.0 0.0 - 0.2 %    Comment: Performed at [facility], [address], [place], [place] [address]  Protime-INR (order if Patient is taking Coumadin / Warfarin)     Status: Abnormal   Collection Time: 08/15/22  3:13 PM  Result Value Ref  Range   Prothrombin Time 15.3 (H) 11.4 - 15.2 seconds   INR 1.2 0.8 - 1.2    Comment: (NOTE) INR goal varies based on device and disease states. Performed at [facility], [address], [place], [place] [address]   Brain natriuretic peptide     Status: Abnormal   Collection Time: 08/15/22  3:13 PM  Result Value Ref Range   B Natriuretic Peptide >4,500.0 (H) 0.0 - 100.0 pg/mL    Comment: Performed at [facility], [address] [place], [place] [address]   DG Chest 2 View  Result Date: 08/15/2022 CLINICAL DATA:  Shortness of breath EXAM: CHEST - 2 VIEW COMPARISON:  Previous studies including the examination of 07/28/2022 FINDINGS: Transverse diameter of heart is increased. Increase in [facility] diameter of chest suggests COPD. Central pulmonary vessels are prominent without signs of alveolar pulmonary edema. There is partial clearing of patchy infiltrates in right lower lung field. Right cardiac margin is better visualized in the current study. There are no new focal infiltrates. There is no pleural effusion or pneumothorax. IMPRESSION: There is partial clearing of infiltrates in right lower lung field suggesting resolving atelectasis/pneumonia. COPD. Cardiomegaly. Central pulmonary vessels are prominent, possibly suggesting mild CHF. Electronically Signed   By: Ernie Avena M.D.   On: 08/15/2022 15:58    Pending Labs Unresulted Labs (From admission, onward)    None       Vitals/Pain Today's Vitals   08/15/22 1903 08/15/22 1906 08/15/22 1907 08/15/22 1930  BP:  (!) 156/119 (!) 171/111   Pulse:  90 90 90  Resp:  (!) 25 18 20   Temp: 97.8 F (36.6 C)     TempSrc: Oral     SpO2:   97% 94%  Weight:      Height:      PainSc:        Isolation Precautions No active isolations  Medications Medications  furosemide (LASIX) injection 80 mg (80 mg Intravenous Given 08/15/22 1822)  ipratropium-albuterol (DUONEB) 0.5-2.5 (3) MG/3ML nebulizer solution 3 mL (3 mLs Nebulization Given  08/15/22 1805)  predniSONE (DELTASONE) tablet 40 mg (40 mg Oral Given 08/15/22 1822)    Mobility walks     Focused Assessments Pulmonary Assessment Handoff:  Lung sounds: Bilateral Breath Sounds: Clear, Diminished O2 Device: Room Air      R Recommendations: See Admitting Provider Note  Report given to:   Additional Notes: Shallow and diminished breath sounds. Ambulatory. A&Ox4. Uses urinal. Only ~200cc out since 80mg  lasix today. Denies bladder fullness. Facial deformities from face and neck CA in 2019.

## 2022-08-15 NOTE — H&P (Addendum)
History and Physical    Patient: Michael [name] WUJ:[id number] DOB: 07-11-1955 DOA: 08/15/2022 DOS: the patient was seen and examined on 08/15/2022 PCP: Benita Stabile, MD  Patient coming from: Home  Chief Complaint:  Chief Complaint  Patient presents with   Shortness of Breath   HPI: Michael [name] is a 67 y.o. male with medical history significant of HFrEF (LVEF on echocardiogram in June 2024 was 25 to 30%), COPD, CAD, CKD stage IIIb, hypertension, tobacco abuse who presents to the emergency after being sent from his cardiologist office with complaint of shortness of breath and bilateral leg swelling since being discharged from the hospital on 6/27 due to similar presentation.  Patient states that he has been compliant with his Lasix except today due to having an appointment with his cardiologist.  On arrival to cardiologist office, patient was noted to be short of breath while sitting and was noted to have a 10 pound weight gain within the last 2 weeks.  He was advised to go to the ER for further evaluation and management.  He denies chest pain, palpitations, fever, chills, cough. Patient was admitted from 6/27 through 07/31/2022 due to dyspnea secondary to acute on chronic HFrEF and dyspnea secondary to multifactorial including HFrEF, COPD and deconditioning He was also admitted from 6/23 to 6/25 due to acute on chronic systolic CHF (LVEF from echo done in March 2024 was 35 to 40%).  Other hospitalizations include: Hospital admission from 04/29/2022 to 05/03/2022 for syncope and COPD exacerbation.  Syncope was thought to be due to orthostasis.  His amlodipine and Entresto were discontinued during that hospitalization and he was discharged with prednisone taper and albuterol.  Patient was also admitted from 3/25 to 04/27/2022 due to elevated troponin that was deemed to be due to demand ischemia.  He also presented during that admission with asymptomatic NSVT.  ED Course:  In the emergency department,  he was hemodynamically stable.  Workup in the ED showed normocytic anemia, BMP was normal except for blood glucose of 106, BUN/creatinine 24/2.05 (baseline creatinine 2.7-1.8), BNP > 4,500 (same as on 07/24/2022). Chest x-ray showed that: There is partial clearing of infiltrates in right lower lung field suggesting resolving atelectasis/pneumonia.  COPD. Cardiomegaly. Central pulmonary vessels are prominent, possibly suggesting mild CHF. Patient was treated with IV Lasix 80 mg x 1, breathing treatment with DuoNeb was provided and prednisone was given. Hospitalist was asked to admit patient for further evaluation and management.  Review of Systems: Review of systems as noted in the HPI. All other systems reviewed and are negative.   Past Medical History:  Diagnosis Date   Aortic atherosclerosis (HCC) 08/03/2021   CAD (coronary artery disease) 08/03/2021   Cardiac catheterization September 2023 with RCA/RV marginal stenosis managed medically   Cardiomyopathy (HCC)    Carotid artery disease (HCC)    CKD (chronic kidney disease) stage 3, GFR 30-59 ml/min (HCC)    COPD (chronic obstructive pulmonary disease) (HCC)    Essential hypertension    Head and neck cancer (HCC) 2019   Right facial basal cell carcinoma s/p resection with right partial mastectomy and partal rhinectomy with skin graft (06/13/17)   Iron deficiency anemia    Urinary retention    Past Surgical History:  Procedure Laterality Date   ANKLE CLOSED REDUCTION Right    open reduction   BASAL CELL CARCINOMA EXCISION  2019   at [facility]   BIOPSY  07/19/2021   Procedure: BIOPSY;  Surgeon: Lanelle Bal, DO;  [facility];  Service: Endoscopy;;   COLONOSCOPY N/A 05/31/2017   Procedure: COLONOSCOPY;  Surgeon: Corbin Ade, MD;  Location: [facility];  Service: Endoscopy;  Laterality: N/A;  2:45pm   COLONOSCOPY WITH PROPOFOL N/A 07/19/2021   Procedure: COLONOSCOPY WITH PROPOFOL;  Surgeon: Lanelle Bal, DO;   [facility]: [facility];  Service: Endoscopy;  Laterality: N/A;  3:00pm, moved up to 9:00   CYSTOSCOPY N/A 10/29/2018   Procedure: CYSTOSCOPY, CLOT EVACUATION;  Surgeon: Rene Paci, MD;  Location: [facility];  Service: Urology;  Laterality: N/A;   PARTIAL COLECTOMY Right 08/11/2021   Procedure: PARTIAL COLECTOMY, OPEN RIGHT HEMICOLECTOMY;  Surgeon: Lucretia Roers, MD;  Location: [facility];  Service: General;  Laterality: Right;   POLYPECTOMY  05/31/2017   Procedure: POLYPECTOMY;  Surgeon: Corbin Ade, MD;  [facility];  Service: Endoscopy;;   RIGHT/LEFT HEART CATH AND CORONARY ANGIOGRAPHY N/A 10/22/2021   Procedure: RIGHT/LEFT HEART CATH AND CORONARY ANGIOGRAPHY;  Surgeon: Orbie Pyo, MD;  Location: [facility];  Service: Cardiovascular;  Laterality: N/A;   TONSILLECTOMY     TRANSCAROTID ARTERY REVASCULARIZATION  Right 02/15/2021   Procedure: RIGHT TRANSCAROTID ARTERY REVASCULARIZATION;  Surgeon: Cephus Shelling, MD;  Location: [facility] OR;  Service: Vascular;  Laterality: Right;   ULTRASOUND GUIDANCE FOR VASCULAR ACCESS Left 02/15/2021   Procedure: ULTRASOUND GUIDANCE FOR VASCULAR ACCESS;  Surgeon: Cephus Shelling, MD;  Location: [facility] OR;  Service: Vascular;  Laterality: Left;   XI ROBOTIC ASSISTED SIMPLE PROSTATECTOMY N/A 10/29/2018   Procedure: XI ROBOTIC ASSISTED SIMPLE PROSTATECTOMY;  Surgeon: Malen Gauze, MD;  Location: [facility];  Service: Urology;  Laterality: N/A;    Social History:  reports that he quit smoking about 13 months ago. His smoking use included cigarettes. He started smoking about 41 years ago. He has a 20 pack-year smoking history. He has never been exposed to tobacco smoke. He has never used smokeless tobacco. He reports that he does not currently use alcohol. He reports that he does not use drugs.   No Known Allergies  Family History  Problem Relation Age of Onset   Stroke Father    Cirrhosis Mother    Colon cancer Neg Hx       Prior to Admission medications   Medication Sig Start Date End Date Taking? Authorizing Provider  acetaminophen (TYLENOL) 500 MG tablet Take 1,000 mg by mouth daily as needed for fever, headache or moderate pain.    [provider]  albuterol (VENTOLIN HFA) 108 (90 Base) MCG/ACT inhaler Inhale 2 puffs into the lungs every 6 (six) hours as needed for wheezing or shortness of breath. 04/27/22   Shahmehdi, Gemma Payor, MD  atorvastatin (LIPITOR) 40 MG tablet Take 1 tablet (40 mg total) by mouth every evening. 08/01/22 08/31/22  Shahmehdi, Gemma Payor, MD  Budeson-Glycopyrrol-Formoterol (BREZTRI AEROSPHERE) 160-9-4.8 MCG/ACT AERO Inhale 2 puffs into the lungs 2 (two) times daily.    [provider]  carvedilol (COREG) 6.25 MG tablet Take 1 tablet (6.25 mg total) by mouth 2 (two) times daily with a meal. 07/26/22 08/25/22  Shahmehdi, Gemma Payor, MD  clopidogrel (PLAVIX) 75 MG tablet Take 1 tablet (75 mg total) by mouth daily. 05/13/22   Strader, Lennart Pall, PA-C  furosemide (LASIX) 40 MG tablet Take 1 tablet (40 mg total) by mouth 2 (two) times daily. 08/01/22   Catarina Hartshorn, MD  isosorbide mononitrate (IMDUR) 60 MG 24 hr tablet Take 1 tablet (60 mg  total) by mouth every evening. 05/13/22   Strader, Lennart Pall, PA-C  potassium chloride SA (KLOR-CON M) 20 MEQ tablet Take 1 tablet (20 mEq total) by mouth daily. 07/26/22 08/25/22  Kendell Bane, MD    Physical Exam: BP (!) 170/117 (BP Location: Right Arm)   Pulse 95   Temp 98.3 F (36.8 C)   Resp 19   Ht 6' (1.829 m)   Wt 64.5 kg   SpO2 100%   BMI 19.29 kg/m   General: 67 y.o. year-old male well developed well nourished in no acute distress.  Alert and oriented x3. HEENT: NCAT, EOMI Reconstructed nasal/reconstructed nasal and facial folds Neck: Supple, trachea medial Cardiovascular: Regular rate and rhythm with no rubs or gallops.  No thyromegaly or JVD noted.  Bilateral lower extremity edema. 2/4 pulses in all 4 extremities. Respiratory:  Mild rales in lower lobes bilaterally.  No wheezes.   Abdomen: Soft, nontender nondistended with normal bowel sounds x4 quadrants. Muskuloskeletal: No cyanosis, clubbing noted bilaterally Neuro: CN II-XII intact, strength 5/5 x 4, sensation, reflexes intact Skin: No ulcerative lesions noted or rashes Psychiatry: Judgement and insight appear normal. Mood is appropriate for condition and setting          Labs on Admission:  Basic Metabolic Panel: Recent Labs  Lab 08/11/22 0927 08/15/22 1513  NA 138 140  K 3.8 3.7  CL 105 106  CO2 26 26  GLUCOSE 138* 106*  BUN 23 24*  CREATININE 1.84* 2.05*  CALCIUM 8.8* 8.9   Liver Function Tests: Recent Labs  Lab 08/11/22 0927  AST 36  ALT 27  ALKPHOS 134*  BILITOT 1.3*  PROT 7.2  ALBUMIN 3.4*   No results for input(s): "LIPASE", "AMYLASE" in the last 168 hours. No results for input(s): "AMMONIA" in the last 168 hours. CBC: Recent Labs  Lab 08/11/22 0927 08/15/22 1513  WBC 8.2 7.0  NEUTROABS 5.7  --   HGB 12.0* 11.6*  HCT 38.5* 37.7*  MCV 90.8 90.8  PLT 230 240   Cardiac Enzymes: No results for input(s): "CKTOTAL", "CKMB", "CKMBINDEX", "TROPONINI" in the last 168 hours.  BNP (last 3 results) Recent Labs    07/26/22 0442 07/28/22 1800 08/15/22 1513  BNP 3,047.0* >4,500.0* >4,500.0*    ProBNP (last 3 results) No results for input(s): "PROBNP" in the last 8760 hours.  CBG: No results for input(s): "GLUCAP" in the last 168 hours.  Radiological Exams on Admission: DG Chest 2 View  Result Date: 08/15/2022 CLINICAL DATA:  Shortness of breath EXAM: CHEST - 2 VIEW COMPARISON:  Previous studies including the examination of 07/28/2022 FINDINGS: Transverse diameter of heart is increased. Increase in AP diameter of chest suggests COPD. Central pulmonary vessels are prominent without signs of alveolar pulmonary edema. There is partial clearing of patchy infiltrates in right lower lung field. Right cardiac margin is better  visualized in the current study. There are no new focal infiltrates. There is no pleural effusion or pneumothorax. IMPRESSION: There is partial clearing of infiltrates in right lower lung field suggesting resolving atelectasis/pneumonia. COPD. Cardiomegaly. Central pulmonary vessels are prominent, possibly suggesting mild CHF. Electronically Signed   By: Ernie Avena M.D.   On: 08/15/2022 15:58    EKG: I independently viewed the EKG done and my findings are as followed: Normal sinus rhythm with first-degree AV block at a rate of 93 bpm and frequent PVCs.  LVH with QRS widening and repolarization abnormality and prolonged QTc of 512 ms  Assessment/Plan Present on Admission:  Acute on chronic combined systolic and diastolic CHF (congestive heart failure) (HCC)  Prolonged QT interval  Acute kidney injury superimposed on CKD (HCC)  COPD (chronic obstructive pulmonary disease) (HCC)  Essential hypertension  Mixed hyperlipidemia  CAD (coronary artery disease)  Principal Problem:   Acute on chronic combined systolic and diastolic CHF (congestive heart failure) (HCC) Active Problems:   Essential hypertension   Prolonged QT interval   Acute kidney injury superimposed on CKD (HCC)   CAD (coronary artery disease)   COPD (chronic obstructive pulmonary disease) (HCC)   Mixed hyperlipidemia   Acute on chronic combined systolic and diastolic CHF BNP > 4500 (same on 07/24/2022) Continue total input/output, daily weights and fluid restriction Continue IV Lasix 40 mg twice daily Continue heart healthy diet  Echocardiogram done on 07/29/2022 showed LVEF of 25 to 30%.  LV has severely decreased function.  LV demonstrates global hypokinesis.  G3 DD in the morning.  RV systolic function mildly reduced. Cardiology will be consulted  Prolonged QT interval QTc 512 ms Avoid QT prolonging drugs Magnesium level will be checked Repeat EKG in the morning  Acute kidney injury on CKD 3B BUN/creatinine  24/2.05 (baseline creatinine 2.7-1.8) Renally adjust medications, avoid nephrotoxic agents/dehydration/hypotension  COPD (not in acute exacerbation) Continue Breztri Aerosphere, albuterol  Essential hypertension Continue Lasix, Coreg, Imdur  Mixed hyperlipidemia Continue Lipitor  CAD Continue clopidogrel, Coreg, Imdur  Goals of care: Palliative care will be consulted   DVT prophylaxis: Lovenox   Advance Care Planning: Full code  Consults: Cardiology  Family Communication: None at bedside  Severity of Illness: At the time of admission, it appears that the appropriate admission status for this patient is INPATIENT. This is judged to be reasonable and necessary in order to provide the required intensity of service to ensure the patient's safety given the presenting symptoms, physical exam findings, and initial radiographic and laboratory data in the context of their  Comorbid conditions.   Patient requires inpatient status due to high intensity of service, high risk for further deterioration and high frequency of surveillance required.   I certify that at the point of admission it is my clinical judgment that the patient will require inpatient hospital care spanning beyond 2 midnights    Author: Frankey Shown, DO 08/15/2022 8:59 PM  For on call review [email or web address].

## 2022-08-16 ENCOUNTER — Inpatient Hospital Stay (HOSPITAL_COMMUNITY): Payer: 59

## 2022-08-16 ENCOUNTER — Inpatient Hospital Stay: Payer: 59

## 2022-08-16 ENCOUNTER — Encounter (HOSPITAL_COMMUNITY): Payer: Self-pay | Admitting: Internal Medicine

## 2022-08-16 DIAGNOSIS — I5021 Acute systolic (congestive) heart failure: Secondary | ICD-10-CM

## 2022-08-16 DIAGNOSIS — Z7189 Other specified counseling: Secondary | ICD-10-CM

## 2022-08-16 DIAGNOSIS — I5043 Acute on chronic combined systolic (congestive) and diastolic (congestive) heart failure: Secondary | ICD-10-CM | POA: Diagnosis not present

## 2022-08-16 DIAGNOSIS — Z515 Encounter for palliative care: Secondary | ICD-10-CM

## 2022-08-16 DIAGNOSIS — Z59819 Housing instability, housed unspecified: Secondary | ICD-10-CM

## 2022-08-16 DIAGNOSIS — I428 Other cardiomyopathies: Secondary | ICD-10-CM

## 2022-08-16 LAB — COMPREHENSIVE METABOLIC PANEL
ALT: 24 U/L (ref 0–44)
AST: 30 U/L (ref 15–41)
Albumin: 3.2 g/dL — ABNORMAL LOW (ref 3.5–5.0)
Alkaline Phosphatase: 124 U/L (ref 38–126)
Anion gap: 11 (ref 5–15)
BUN: 24 mg/dL — ABNORMAL HIGH (ref 8–23)
CO2: 25 mmol/L (ref 22–32)
Calcium: 8.6 mg/dL — ABNORMAL LOW (ref 8.9–10.3)
Chloride: 105 mmol/L (ref 98–111)
Creatinine, Ser: 1.93 mg/dL — ABNORMAL HIGH (ref 0.61–1.24)
GFR, Estimated: 37 mL/min — ABNORMAL LOW (ref >60)
Glucose, Bld: 189 mg/dL — ABNORMAL HIGH (ref 70–99)
Potassium: 3.4 mmol/L — ABNORMAL LOW (ref 3.5–5.1)
Sodium: 141 mmol/L (ref 135–145)
Total Bilirubin: 1.2 mg/dL (ref 0.3–1.2)
Total Protein: 6.7 g/dL (ref 6.5–8.1)

## 2022-08-16 LAB — MAGNESIUM: Magnesium: 2.1 mg/dL (ref 1.7–2.4)

## 2022-08-16 LAB — ECHOCARDIOGRAM LIMITED
Calc EF: 24.8 %
Est EF: 25
Height: 72 in
S' Lateral: 5.9 cm
Single Plane A2C EF: 26.4 %
Single Plane A4C EF: 23.4 %
Weight: 2208.13 oz

## 2022-08-16 LAB — PHOSPHORUS: Phosphorus: 3.2 mg/dL (ref 2.5–4.6)

## 2022-08-16 LAB — CBC
HCT: 37.7 % — ABNORMAL LOW (ref 39.0–52.0)
Hemoglobin: 11.6 g/dL — ABNORMAL LOW (ref 13.0–17.0)
MCH: 27.7 pg (ref 26.0–34.0)
MCHC: 30.8 g/dL (ref 30.0–36.0)
MCV: 90 fL (ref 80.0–100.0)
Platelets: 227 10*3/uL (ref 150–400)
RBC: 4.19 MIL/uL — ABNORMAL LOW (ref 4.22–5.81)
RDW: 14.7 % (ref 11.5–15.5)
WBC: 3 10*3/uL — ABNORMAL LOW (ref 4.0–10.5)
nRBC: 0 % (ref 0.0–0.2)

## 2022-08-16 MED ORDER — FLUTICASONE FUROATE-VILANTEROL 200-25 MCG/ACT IN AEPB
1.0000 | INHALATION_SPRAY | Freq: Every day | RESPIRATORY_TRACT | Status: DC
  Administered 2022-08-16 – 2022-08-18 (×3): 1 via RESPIRATORY_TRACT
  Filled 2022-08-16: qty 28

## 2022-08-16 MED ORDER — ATORVASTATIN CALCIUM 40 MG PO TABS
40.0000 mg | ORAL_TABLET | Freq: Every evening | ORAL | Status: DC
  Administered 2022-08-16 – 2022-08-17 (×2): 40 mg via ORAL
  Filled 2022-08-16 (×2): qty 1

## 2022-08-16 MED ORDER — CLOPIDOGREL BISULFATE 75 MG PO TABS
75.0000 mg | ORAL_TABLET | Freq: Every day | ORAL | Status: DC
  Administered 2022-08-16 – 2022-08-18 (×3): 75 mg via ORAL
  Filled 2022-08-16 (×3): qty 1

## 2022-08-16 MED ORDER — ALBUTEROL SULFATE HFA 108 (90 BASE) MCG/ACT IN AERS: 2.0000 | INHALATION_SPRAY | Freq: Four times a day (QID) | RESPIRATORY_TRACT | Status: DC | PRN

## 2022-08-16 MED ORDER — ALBUTEROL SULFATE (2.5 MG/3ML) 0.083% IN NEBU: 2.5000 mg | INHALATION_SOLUTION | Freq: Four times a day (QID) | RESPIRATORY_TRACT | Status: DC | PRN

## 2022-08-16 MED ORDER — POTASSIUM CHLORIDE CRYS ER 20 MEQ PO TBCR
40.0000 meq | EXTENDED_RELEASE_TABLET | ORAL | Status: AC
  Administered 2022-08-16 (×2): 40 meq via ORAL
  Filled 2022-08-16 (×2): qty 2

## 2022-08-16 MED ORDER — UMECLIDINIUM BROMIDE 62.5 MCG/ACT IN AEPB
1.0000 | INHALATION_SPRAY | Freq: Every day | RESPIRATORY_TRACT | Status: DC
  Administered 2022-08-16 – 2022-08-18 (×3): 1 via RESPIRATORY_TRACT
  Filled 2022-08-16: qty 7

## 2022-08-16 MED ORDER — ISOSORBIDE MONONITRATE ER 60 MG PO TB24
60.0000 mg | ORAL_TABLET | Freq: Every evening | ORAL | Status: DC
  Administered 2022-08-16: 60 mg via ORAL
  Filled 2022-08-16: qty 1

## 2022-08-16 MED ORDER — CARVEDILOL 3.125 MG PO TABS
6.2500 mg | ORAL_TABLET | Freq: Two times a day (BID) | ORAL | Status: DC
  Filled 2022-08-16: qty 2

## 2022-08-16 MED ORDER — BUDESON-GLYCOPYRROL-FORMOTEROL 160-9-4.8 MCG/ACT IN AERO: 2.0000 | INHALATION_SPRAY | Freq: Two times a day (BID) | RESPIRATORY_TRACT | Status: DC

## 2022-08-16 MED ORDER — CARVEDILOL 12.5 MG PO TABS
12.5000 mg | ORAL_TABLET | Freq: Two times a day (BID) | ORAL | Status: DC
  Administered 2022-08-16 – 2022-08-18 (×5): 12.5 mg via ORAL
  Filled 2022-08-16 (×5): qty 1

## 2022-08-16 NOTE — Progress Notes (Signed)
Mobility Specialist Progress Note:    08/16/22 1022  Mobility  Activity Transferred to/from [facility];Ambulated independently to bathroom  Level of Assistance Standby assist, set-up cues, supervision of patient - no hands on  Assistive Device None  Distance Ambulated (ft) 10 ft  Range of Motion/Exercises Active;All extremities  Activity Response Tolerated well  Mobility Referral Yes  $Mobility charge 1 Mobility  Mobility Specialist Start Time (ACUTE ONLY) 1015  Mobility Specialist Stop Time (ACUTE ONLY) 1022  Mobility Specialist Time Calculation (min) (ACUTE ONLY) 7 min   Pt requested assistance to bathroom. Ambulated independently with no AD required. Tolerated well, asx throughout. Pt returned to chair, all needs met, call bell in reach.   Feliciana Rossetti Mobility Specialist Please contact via Special educational needs teacher or  Rehab office at [phone]

## 2022-08-16 NOTE — Progress Notes (Addendum)
Patient Name: Michael [name] Date of Encounter: 08/16/2022 [facility] Cardiologist: Nona Dell, MD    Interval Summary  .    Breathing better, swelling down. Has no way of getting transport to [place] for AHF.  Vital Signs .    Vitals:   08/15/22 2016 08/16/22 0008 08/16/22 0340 08/16/22 0500  BP:  (!) 163/75 (!) 154/93   Pulse:  86 69   Resp:  18 (!) 21   Temp:  98.6 F (37 C) 98.9 F (37.2 C)   TempSrc:  Oral Oral   SpO2:  98% 99%   Weight: 64.5 kg   62.6 kg  Height: 6' (1.829 m)       Intake/Output Summary (Last 24 hours) at 08/16/2022 0750 Last data filed at 08/15/2022 2006 Gross per 24 hour  Intake --  Output 475 ml  Net -475 ml      08/16/2022    5:00 AM 08/15/2022    8:16 PM 08/15/2022    2:55 PM  Last 3 Weights  Weight (lbs) 138 lb 0.1 oz 142 lb 3.2 oz 145 lb  Weight (kg) 62.6 kg 64.5 kg 65.772 kg      Telemetry/ECG    NSR with PVC's, 5 beat NSVT - Personally Reviewed  Physical Exam .   GEN: No acute distress.   Neck: No JVD Cardiac:  RRR, no murmurs, rubs, or gallops.  Respiratory: decreased breath sounds with some wheezing bilaterally. GI: Soft, nontender, non-distended  MS: No edema  Assessment & Plan .      HFrEF 25-30% on most recent echo, end stage with multiple admissions, BNP >4500 on Lasix IV 40 mg bid, weight down 7 lbs 145 to 138 lbs today, I/O's -450cc ? Accuracy -continue diuresis today -palliative care consult  CAD Cath 10/2021 mild to mod CAD medical management recommended on Plavix, Imdur, atorvastatin, coreg  HTN BP running high. Increase coreg 12.5 mg bid  Stage 3 CKD Crt 1.93, K 3.4  HLD on atorvastatin  For questions or updates, please contact [facility] Please consult [email or web address] for contact info under        Signed, Jacolyn Reedy, PA-C     Attending attestation   Patient seen and independently examined with Jacolyn Reedy, PA-C. We discussed all aspects of the encounter. I agree with the  assessment and plan as stated above.  Patient is a 67 year old M known to have NICM LVEF 35 to 40%, mild to moderate CAD, HTN, HLD, stage III CKD, COPD was sent from cardiology clinic for worsening DOE and is currently admitted to hospitalist team for the management of acute on chronic systolic heart failure exacerbation. He was previously discharged from the [facility] for similar complaints.  He reported medication compliance. BNP >4500. Chest x-ray showed mild CHF.  He is currently on IV Lasix 40 mg twice daily with only 475 mL of urine output, I/O are not accurately charted.  Obtain RedsVest to determine diuresis.  For 6 admissions remarkable for patient not in acute respite distress, JVD mildly elevated, S1-S2 normal, clear lungs, abdomen NT and ND, no pitting edema in bilateral lower extremities, AOx3. Patient walked in the hallway with no symptoms of SOB.  For ADHF, continue IV Lasix 40 mg twice daily for now and obtain RedsVest to determine diuretic regimen.  For NICM, continue GDMT with carvedilol 12.5 mg twice daily (increased for HTN control).  For moderate CAD, continue Plavix 100 mg once daily, atorvastatin 40 mg nightly.  Patient currently lives with his neighbor and in the last hospital visit, he reported that he was living with his roommates. Social worker consult for housing situation.  Palliative care consult for multiple hospitalizations for heart failure exacerbation.  Michael Minton Verne Spurr, MD [place]  [facility]  10:55 AM

## 2022-08-16 NOTE — Consult Note (Signed)
Consultation Note Date: 08/16/2022   Patient Name: Michael [name]  DOB: 1955-05-08  MRN: [id number]  Age / Sex: 67 y.o., male  PCP: Benita Stabile, MD Referring Physician: Shon Hale, MD  Reason for Consultation: Establishing goals of care  HPI/Patient Profile: 67 y.o. male  with past medical history of HFrEF-25 to 30%, COPD, CAD, CKD 3, HTN, former smoker, locally advanced nasolabial fold basal cell cancer sp resection/skin graft 2019 dermatology follow-up, right colon mass tubular adenomas with high-grade dysplasia 2019 with right hemicolectomy in June 2023, GI follow-up, admitted on 08/15/2022 with acute on chronic combined heart failure.   Clinical Assessment and Goals of Care: I have reviewed medical records including EPIC notes, labs and imaging, received report from RN, assessed the patient.  Michael [name] is resting quietly in the Crozier chair in his room.  He is resting easily but wakes when I enter the room.  He appears chronically ill and frail.  He greets me, making and somewhat keeping eye contact.  He is alert and oriented x 3, able to make his needs known.  There is no family at bedside at this time.  We meet at the bedside to discuss diagnosis prognosis, GOC, EOL wishes, disposition and options. I introduced Palliative Medicine as specialized medical care for people living with serious illness. It focuses on providing relief from the symptoms and stress of a serious illness. The goal is to improve quality of life for both the patient and the family.  Michael [name] endorses that he recognizes me from previous visits.  We discussed a brief life review of the patient.  From 04/26/2022 palliative consult.  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  He is currently living with a friend, Oren Section and Cecil's mother.  He tells me that he is independent  with ADLs, does not drive because he does not have a car.  Alfredo Bach and he go together to get groceries.  He manages his own finances.  He is unmarried, his parents are deceased.  He has 1 child, son Onalee Hua who lives in [place]."  Michael [name] today tells me that he cannot find his son and does not know how to contact him.  We then focused on their current illness.  We talked about the fluid overload and the treatment plan.  We talk about diuresing.  We talk about 6 hospital stays and 7 ED visits in the last 6 months.  We talk about heart failure as a progressive disease.  The natural disease trajectory and expectations at EOL were discussed.  Advanced directives, concepts specific to code status, artifical feeding and hydration, and rehospitalization were considered and discussed.  We talked about the concept of "treat the treatable, but allowing natural passing".  Michael [name] has had these discussions with multiple providers.  He is tearful today stating that he would not want attempted resuscitation or life support.  At continue to reassure him that we will continue to  care for him.  Orders adjusted.  Hospice and Palliative Care services outpatient were explained and offered.  We talked in detail about the benefits of outpatient palliative services and hospice services.  We talk about "treat the treatable" hospice care and how they could likely help Michael [name].  Michael [name] becomes tearful during our hospice discussions.  He shares that people with hospice die.  We talk about the expert care that hospice can provide.  He is considering.  Discussed the importance of continued conversation with family and the medical providers regarding overall plan of care and treatment options, ensuring decisions are within the context of the patient's values and GOCs.  Questions and concerns were addressed.  The patient was encouraged to call with questions or concerns.  PMT will continue to support  holistically.  Conference with attending, bedside nursing staff, transition of care team related to patient condition, needs, goals of care, disposition.   HCPOA  OTHER -Michael [name] names his friends [name] and [name] as his healthcare surrogates.    SUMMARY OF RECOMMENDATIONS   At this point continue to treat the treatable but no CPR or intubation Time for outcomes. Would likely benefit from short-term rehab Considering "treat the treatable" hospice care    Code Status/Advance Care Planning: DNR - We talked about the concept of "treat the treatable, but allowing natural passing".  Michael [name] has had these discussions with multiple providers.  He is tearful today stating that he would not want attempted resuscitation or life support.  At continue to reassure him that we will continue to care for him.  Orders adjusted.  Symptom Management:  Per hospitalist, no additional needs at this time.   Palliative Prophylaxis:  Frequent Pain Assessment and Oral Care  Additional Recommendations (Limitations, Scope, Preferences): Treat the treatable, but no CPR or intubation   Psycho-social/Spiritual:  Desire for further Chaplaincy support:no Additional Recommendations: Caregiving  Support/Resources and Education on Hospice  Prognosis:  Unable to determine, 6 months or less would not be surprising based on chronic illness burden, decreasing functional status, 6 hospital stays and 7 ED visits in the last 6 months.  Discharge Planning: To Be Determined      Primary Diagnoses: Present on Admission:  Acute on chronic combined systolic and diastolic CHF (congestive heart failure) (HCC)  Prolonged QT interval  Acute kidney injury superimposed on CKD (HCC)  COPD (chronic obstructive pulmonary disease) (HCC)  Essential hypertension  Mixed hyperlipidemia  CAD (coronary artery disease)   I have reviewed the medical record, interviewed the patient and family, and examined the  patient. The following aspects are pertinent.  Past Medical History:  Diagnosis Date   Aortic atherosclerosis (HCC) 08/03/2021   CAD (coronary artery disease) 08/03/2021   Cardiac catheterization September 2023 with RCA/RV marginal stenosis managed medically   Cardiomyopathy (HCC)    Carotid artery disease (HCC)    CKD (chronic kidney disease) stage 3, GFR 30-59 ml/min (HCC)    COPD (chronic obstructive pulmonary disease) (HCC)    Essential hypertension    Head and neck cancer (HCC) 2019   Right facial basal cell carcinoma s/p resection with right partial mastectomy and partal rhinectomy with skin graft (06/13/17)   Iron deficiency anemia    Urinary retention    Social History   Socioeconomic History   Marital status: Single    Spouse name: Not on file   Number of children: Not on file   Years of education: Not on file   Highest education  level: Not on file  Occupational History   Not on file  Tobacco Use   Smoking status: Former    Current packs/day: 0.00    Average packs/day: 0.5 packs/day for 40.0 years (20.0 ttl pk-yrs)    Types: Cigarettes    Start date: 07/02/1981    Quit date: 07/02/2021    Years since quitting: 1.1    Passive exposure: Never   Smokeless tobacco: Never   Tobacco comments:    Quit on 06/09/21  Vaping Use   Vaping status: Never Used  Substance and Sexual Activity   Alcohol use: Not Currently   Drug use: Never   Sexual activity: Not Currently  Other Topics Concern   Not on file  Social History Narrative   Not on file   Social Determinants of Health   Financial Resource Strain: Low Risk  (12/11/2019)   Overall Financial Resource Strain (CARDIA)    Difficulty of Paying Living Expenses: Not hard at all  Food Insecurity: No Food Insecurity (08/02/2022)   Hunger Vital Sign    Worried About Running Out of Food in the Last Year: Never true    Ran Out of Food in the Last Year: Never true  Transportation Needs: No Transportation Needs (08/02/2022)    PRAPARE - Administrator, Civil Service (Medical): No    Lack of Transportation (Non-Medical): No  Recent Concern: Transportation Needs - Unmet Transportation Needs (05/05/2022)   PRAPARE - Transportation    Lack of Transportation (Medical): Yes    Lack of Transportation (Non-Medical): Yes  Physical Activity: Inactive (12/11/2019)   Exercise Vital Sign    Days of Exercise per Week: 0 days    Minutes of Exercise per Session: 0 min  Stress: No Stress Concern Present (12/11/2019)   [employer] of Occupational Health - Occupational Stress Questionnaire    Feeling of Stress : Not at all  Social Connections: Socially Isolated (12/11/2019)   Social Connection and Isolation Panel [NHANES]    Frequency of Communication with Friends and Family: Never    Frequency of Social Gatherings with Friends and Family: Never    Attends Religious Services: Never    Database administrator or Organizations: No    Attends Engineer, structural: Never    Marital Status: Divorced   Family History  Problem Relation Age of Onset   Stroke Father    Cirrhosis Mother    Colon cancer Neg Hx    Scheduled Meds:  atorvastatin  40 mg Oral QPM   carvedilol  12.5 mg Oral BID WC   clopidogrel  75 mg Oral Daily   enoxaparin (LOVENOX) injection  40 mg Subcutaneous Q24H   fluticasone furoate-vilanterol  1 puff Inhalation Daily   And   umeclidinium bromide  1 puff Inhalation Daily   furosemide  40 mg Intravenous Q12H   isosorbide mononitrate  60 mg Oral QPM   Continuous Infusions: PRN Meds:.acetaminophen **OR** acetaminophen, albuterol, prochlorperazine Medications Prior to Admission:  Prior to Admission medications   Medication Sig Start Date End Date Taking? Authorizing Provider  acetaminophen (TYLENOL) 500 MG tablet Take 1,000 mg by mouth daily as needed for fever, headache or moderate pain.   Yes [provider]  albuterol (VENTOLIN HFA) 108 (90 Base) MCG/ACT inhaler Inhale 2  puffs into the lungs every 6 (six) hours as needed for wheezing or shortness of breath. 04/27/22  Yes Shahmehdi, Seyed A, MD  atorvastatin (LIPITOR) 40 MG tablet Take 1 tablet (40 mg total)  by mouth every evening. 08/01/22 08/31/22 Yes Shahmehdi, Gemma Payor, MD  Budeson-Glycopyrrol-Formoterol (BREZTRI AEROSPHERE) 160-9-4.8 MCG/ACT AERO Inhale 2 puffs into the lungs 2 (two) times daily.   Yes [provider]  carvedilol (COREG) 6.25 MG tablet Take 1 tablet (6.25 mg total) by mouth 2 (two) times daily with a meal. 07/26/22 08/25/22 Yes Shahmehdi, Seyed A, MD  clopidogrel (PLAVIX) 75 MG tablet Take 1 tablet (75 mg total) by mouth daily. 05/13/22  Yes Strader, [place] M, PA-C  furosemide (LASIX) 40 MG tablet Take 1 tablet (40 mg total) by mouth 2 (two) times daily. Patient taking differently: Take 40 mg by mouth daily. 08/01/22  Yes Tat, Onalee Hua, MD  isosorbide mononitrate (IMDUR) 60 MG 24 hr tablet Take 1 tablet (60 mg total) by mouth every evening. 05/13/22  Yes Strader, [place] M, PA-C  potassium chloride SA (KLOR-CON M) 20 MEQ tablet Take 1 tablet (20 mEq total) by mouth daily. 07/26/22 08/25/22 Yes Shahmehdi, Gemma Payor, MD   No Known Allergies Review of Systems  Unable to perform ROS: Other    Physical Exam Vitals and nursing note reviewed.  Constitutional:      General: He is not in acute distress.    Appearance: He is ill-appearing.  Cardiovascular:     Rate and Rhythm: Normal rate.  Pulmonary:     Effort: Tachypnea present.  Musculoskeletal:     Right lower leg: No edema.     Left lower leg: No edema.  Neurological:     Mental Status: He is alert and oriented to person, place, and time.  Psychiatric:        Mood and Affect: Mood normal.        Behavior: Behavior normal.     Vital Signs: BP 123/74   Pulse 62   Temp 97.7 F (36.5 C) (Oral)   Resp (!) 24   Ht 6' (1.829 m)   Wt 62.6 kg   SpO2 100%   BMI 18.72 kg/m  Pain Scale: 0-10   Pain Score: 0-No pain   SpO2: SpO2: 100  % O2 Device:SpO2: 100 % O2 Flow Rate: .   IO: Intake/output summary:  Intake/Output Summary (Last 24 hours) at 08/16/2022 1356 Last data filed at 08/16/2022 1238 Gross per 24 hour  Intake 480 ml  Output 775 ml  Net -295 ml    LBM:   Baseline Weight: Weight: 65.8 kg Most recent weight: Weight: 62.6 kg     Palliative Assessment/Data:     Time In: 0930 Time Out: 1045 Time Total: 75 minutes  Greater than 50%  of this time was spent counseling and coordinating care related to the above assessment and plan.  Signed by: Katheran Awe, NP   Please contact Palliative Medicine Team phone at [phone] for questions and concerns.  For individual provider: See Loretha Stapler

## 2022-08-16 NOTE — TOC Initial Note (Signed)
Transition of Care [facility]) - Initial/Assessment Note    Patient Details  Name: Michael [name] MRN: [id number] Date of Birth: [birth year]  Transition of Care [facility]) CM/SW Contact:    Annice Needy, LCSW Phone Number: 08/16/2022, 1:50 PM  Clinical Narrative:                 Patient from home with friends. Admitted for CHF. Considered high risk for readmission. Michael [name], friend, states that patient lives with her and her husband and has been for nearly a year. He has no DME in the home. Their mobile home is not conducive for DME. Patient does not like showering because it takes a lot out of him. He can only ambulate a few steps without being out of breath. They built him a room in their hoe. Patient's income is $1137/month. Michael [name] states that patient may be requiring more care than they can imagine.       Expected Discharge Plan: Home w Home Health Services Barriers to Discharge: Continued Medical Work up   Patient Goals and CMS Choice            Expected Discharge Plan and Services       Living arrangements for the past 2 months: Mobile Home                                      Prior Living Arrangements/Services Living arrangements for the past 2 months: Mobile Home Lives with:: Friends              Current home services: Home PT    Activities of Daily Living Home Assistive Devices/Equipment: None ADL Screening (condition at time of admission) Patient's cognitive ability adequate to safely complete daily activities?: Yes Is the patient deaf or have difficulty hearing?: No Does the patient have difficulty seeing, even when wearing glasses/contacts?: No Does the patient have difficulty concentrating, remembering, or making decisions?: No Patient able to express need for assistance with ADLs?: Yes Does the patient have difficulty dressing or bathing?: No Independently performs ADLs?: Yes (appropriate for developmental age) Does the patient have difficulty  walking or climbing stairs?: No Weakness of Legs: None Weakness of Arms/Hands: None  Permission Sought/Granted                  Emotional Assessment       Orientation: : Oriented to Self, Oriented to Place, Oriented to  Time, Oriented to Situation Alcohol / Substance Use: Not Applicable Psych Involvement: No (comment)  Admission diagnosis:  Acute on chronic systolic congestive heart failure (HCC) [I50.23] Acute on chronic systolic CHF (congestive heart failure) (HCC) [I50.23] Chronic obstructive pulmonary disease, unspecified COPD type (HCC) [J44.9] Patient Active Problem List   Diagnosis Date Noted   Calculus of gallbladder without cholecystitis without obstruction 08/10/2022   Elevated troponin level not due myocardial infarction 07/29/2022   Acute on chronic combined systolic and diastolic CHF (congestive heart failure) (HCC) 07/24/2022   History of malignant neoplasm of skin 05/17/2022   Nicotine dependence, cigarettes, uncomplicated 05/17/2022   Noncompliance with treatment 05/17/2022   Orthostatic hypotension 05/17/2022   Vitamin D deficiency 05/17/2022   Troponin level elevated 04/29/2022   Abnormal level of hormones in specimens from other organs, systems and tissues 04/29/2022   Solitary pulmonary nodule 04/26/2022   First degree heart block 04/12/2022   Fracture of rib 02/27/2022   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  COPD (chronic obstructive pulmonary disease) (HCC) 11/03/2021   Acute non-ST segment elevation myocardial infarction (HCC) 11/03/2021   Aneurysm of infrarenal abdominal aorta (HCC) 11/03/2021   Functional gait abnormality 11/03/2021   Hypoalbuminemia 11/03/2021   Hypomagnesemia 11/03/2021   Mixed hyperlipidemia 11/03/2021   Moderate protein-calorie malnutrition (HCC) 11/03/2021   Physical deconditioning 11/03/2021   Proteinuria 11/03/2021   Transaminitis 10/23/2021   Pressure injury of skin 10/22/2021   Type 2 MI (myocardial  infarction) (HCC) 10/20/2021   Myocardial infarction (HCC) 10/20/2021   Atrophy of pancreas 08/11/2021   Mass of colon 08/06/2021   Preoperative cardiovascular examination 08/03/2021   CAD (coronary artery disease) 08/03/2021   Chronic kidney disease, stage 3b (HCC) 08/03/2021   Hardening of the aorta (main artery of the heart) (HCC) 08/03/2021   Rectal hemorrhage 06/16/2021   History of adenomatous polyp of colon 06/16/2021   Carotid stenosis, asymptomatic, right 02/15/2021   Occlusion and stenosis of right carotid artery 02/15/2021   Basal cell carcinoma of [place]hus, right 12/06/2019   Incisional hernia 11/15/2019   Elevated PSA 06/19/2019   Benign prostatic hyperplasia with urinary obstruction 10/29/2018   CAP (community acquired pneumonia) 05/09/2018   Hypertensive emergency 05/09/2018   Chronic HFrEF (heart failure with reduced ejection fraction) (HCC) 05/09/2018   Lobar pneumonia (HCC) 05/09/2018   Retention of urine 11/27/2017   Vasovagal syncope 09/17/2017   Sepsis due to urinary tract infection (HCC) 09/01/2017   Syncope and collapse 07/05/2017   COPD with acute exacerbation (HCC) 07/05/2017   Acute kidney injury superimposed on CKD (HCC) 07/05/2017   Urinary tract infectious disease 07/05/2017   Leukocytosis 07/05/2017   Incomplete bladder emptying 07/05/2017   UTI (urinary tract infection) 07/05/2017   Disorder of carotid artery (HCC) 06/25/2017   Orthostatic syncope    Syncope due to orthostatic hypotension 06/24/2017   Cardiac murmur 06/24/2017   Prolonged QT interval 06/24/2017   Imaging of gastrointestinal tract abnormal 05/25/2017   Basal cell carcinoma of eyelid 05/15/2017   Basal cell carcinoma (BCC) of nostril 05/12/2017   Skin lesion of face 05/03/2017   Other specified disorders of nose and nasal sinuses 05/03/2017   Periapical abscess 04/04/2017   Iron deficiency anemia 04/04/2017   Essential hypertension 04/02/2017   Anemia of chronic disease  04/02/2017   Acute hypoxemic respiratory failure (HCC) 04/02/2017   Lactic acidosis 04/02/2017   Tobacco user 04/02/2017   Thrombocytosis 04/02/2017   Hypokalemia 04/02/2017   Unspecified diastolic (congestive) heart failure (HCC) 04/02/2017   Nasal lesion    PCP:  Benita Stabile, MD Pharmacy:   [facility] [place], [place] - [address] [place] [address] Phone: [phone] Fax: [phone]     Social Determinants of Health (SDOH) Social History: SDOH Screenings   Food Insecurity: No Food Insecurity (08/02/2022)  Housing: Low Risk  (08/02/2022)  Recent Concern: Housing - Medium Risk (05/05/2022)  Transportation Needs: No Transportation Needs (08/02/2022)  Recent Concern: Transportation Needs - Unmet Transportation Needs (05/05/2022)  Utilities: Not At Risk (08/02/2022)  Alcohol Screen: Low Risk  (12/11/2019)  Depression (PHQ2-9): Low Risk  (08/02/2022)  Financial Resource Strain: Low Risk  (12/11/2019)  Physical Activity: Inactive (12/11/2019)  Social Connections: Socially Isolated (12/11/2019)  Stress: No Stress Concern Present (12/11/2019)  Tobacco Use: Medium Risk (08/15/2022)   SDOH Interventions:     Readmission Risk Interventions    07/25/2022    1:09 PM 04/26/2022   11:46 AM 10/26/2021    2:57 PM  Readmission Risk Prevention Plan  Transportation Screening Complete Complete Complete  PCP or Specialist Appt within 3-5 Days   Complete  HRI or Home Care Consult  Complete Complete  Social Work Consult for Recovery Care Planning/Counseling  Complete Complete  Palliative Care Screening  Not Applicable Not Applicable  Medication Review Oceanographer) Complete Complete Complete  PCP or Specialist appointment within 3-5 days of discharge Not Complete    HRI or Home Care Consult Complete    SW Recovery Care/Counseling Consult Complete    Palliative Care Screening Complete    Skilled Nursing Facility Not Applicable

## 2022-08-16 NOTE — Progress Notes (Signed)
PROGRESS NOTE  Michael [name], is a 67 y.o. male, DOB - 06-Aug-1955, WUJ:[id number]  Admit date - 08/15/2022   Admitting Physician Frankey Shown, DO  Outpatient Primary MD for the patient is Benita Stabile, MD  LOS - 1  Chief Complaint  Patient presents with   Shortness of Breath      Brief Narrative:   67 y.o. male with medical history significant of HFrEF (LVEF on echocardiogram in June 2024 was 25 to 30%), COPD, CAD, CKD stage IIIb, hypertension, tobacco abuse admitted on 08/15/2022 with acute combined diastolic and systolic CHF exacerbation    -Assessment and Plan: 1)HFrEF--acute exacerbation of combined diastolic and systolic dysfunction CHF --presumed nonischemic cardiomyopathy  -repeat echo from 08/16/2022 with EF of 25% similar to prior -No hypoxia -Cardiology consult appreciated --Continue IV Lasix  -Dailyweights and fluid input and output management  2)H/o CAD--LHC from 10/20/2021 with mild to moderate CAD -Chest pain-free, -Continue medical management with isosorbide, carvedilol, atorvastatin and Plavix  3)-CKD stage -3B    -creatinine on admission= 2.05,  -baseline creatinine = 1.8-2.0    ,  -- renally adjust medications, avoid nephrotoxic agents / dehydration  / hypotension  4)Social/ethics--- patient lives with his neighbors/roommate -Palliative care consult appreciated patient is a DNR/DNI  5)COPD--no acute exacerbation,  -No further steroids continue bronchodilators -No hypoxia  6)Hypokalemia--due to diuretics, magnesium WNL -Replace  Status is: Inpatient   Disposition: The patient is from: Home              Anticipated d/c is to: Home              Anticipated d/c date is: 2 days              Patient currently is not medically stable to d/c. Barriers: Not Clinically Stable-   Code Status :  -  Code Status: DNR   Family Communication:    NA (patient is alert, awake and coherent)   DVT Prophylaxis  :   - SCDs  enoxaparin (LOVENOX) injection 40 mg  Start: 08/15/22 2200 SCDs Start: 08/15/22 2008   Lab Results  Component Value Date   PLT 227 08/16/2022    Inpatient Medications  Scheduled Meds:  atorvastatin  40 mg Oral QPM   carvedilol  12.5 mg Oral BID WC   clopidogrel  75 mg Oral Daily   enoxaparin (LOVENOX) injection  40 mg Subcutaneous Q24H   fluticasone furoate-vilanterol  1 puff Inhalation Daily   And   umeclidinium bromide  1 puff Inhalation Daily   furosemide  40 mg Intravenous Q12H   isosorbide mononitrate  60 mg Oral QPM   Continuous Infusions: PRN Meds:.acetaminophen **OR** acetaminophen, albuterol, prochlorperazine   Anti-infectives (From admission, onward)    None      Subjective: [name] today has no fevers, no emesis,  No chest pain,   - Voiding well No cough    Objective: Vitals:   08/16/22 0008 08/16/22 0340 08/16/22 0500 08/16/22 1100  BP: (!) 163/75 (!) 154/93  123/74  Pulse: 86 69  62  Resp: 18 (!) 21  (!) 24  Temp: 98.6 F (37 C) 98.9 F (37.2 C)  97.7 F (36.5 C)  TempSrc: Oral Oral  Oral  SpO2: 98% 99%  100%  Weight:   62.6 kg   Height:        Intake/Output Summary (Last 24 hours) at 08/16/2022 1745 Last data filed at 08/16/2022 1457 Gross per 24 hour  Intake 480 ml  Output  1075 ml  Net -595 ml   Filed Weights   08/15/22 1455 08/15/22 2016 08/16/22 0500  Weight: 65.8 kg 64.5 kg 62.6 kg    Physical Exam  Gen:- Awake Alert,  in no apparent distress HEENT:- [place].AT, No sclera icterus Neck-Supple Neck ,  JVD,.  Lungs-fair air movement, faint bibasilar rales,  CV- S1, S2 normal, regular  Abd-  +ve B.Sounds, Abd Soft, No tenderness,    Extremity/Skin:- No significant edema, pedal pulses present  Psych-affect is appropriate, oriented x3 Neuro-no new focal deficits, no tremors  Data Reviewed: I have personally reviewed following labs and imaging studies  CBC: Recent Labs  Lab 08/11/22 0927 08/15/22 1513 08/16/22 0408  WBC 8.2 7.0 3.0*  NEUTROABS 5.7  --   --    HGB 12.0* 11.6* 11.6*  HCT 38.5* 37.7* 37.7*  MCV 90.8 90.8 90.0  PLT 230 240 227   Basic Metabolic Panel: Recent Labs  Lab 08/11/22 0927 08/15/22 1513 08/16/22 0408  NA 138 140 141  K 3.8 3.7 3.4*  CL 105 106 105  CO2 26 26 25   GLUCOSE 138* 106* 189*  BUN 23 24* 24*  CREATININE 1.84* 2.05* 1.93*  CALCIUM 8.8* 8.9 8.6*  MG  --   --  2.1  PHOS  --   --  3.2   GFR: Estimated Creatinine Clearance: 32.4 mL/min (A) (by C-G formula based on SCr of 1.93 mg/dL (H)). Liver Function Tests: Recent Labs  Lab 08/11/22 0927 08/16/22 0408  AST 36 30  ALT 27 24  ALKPHOS 134* 124  BILITOT 1.3* 1.2  PROT 7.2 6.7  ALBUMIN 3.4* 3.2*   Radiology Studies: ECHOCARDIOGRAM LIMITED  Result Date: 08/16/2022    ECHOCARDIOGRAM LIMITED REPORT   [name] Date of Exam: 08/16/2022 Medical Rec #:  [id number]     Height:       72.0 in Accession #:    [id number]    Weight:       138.0 lb Date of Birth:  25-Jun-1955     BSA:          1.820 m Patient Age:    67 years      BP:           154/93 mmHg Patient Gender: M             HR:           85 bpm. [facility] Procedure: Limited Echo, Limited Color Doppler and Cardiac Doppler Indications:    CHF-Acute Systolic I50.21  History:        Patient has prior history of Echocardiogram examinations, most                 recent 07/29/2022. CHF and Cardiomyopathy, CAD, COPD and Carotid                 Disease; Risk Factors:Hypertension, Dyslipidemia and Former                 Smoker.  Sonographer:    Aron Baba Referring Phys: [id number] OLADAPO ADEFESO IMPRESSIONS  1. Limited Echo for LVEF assessment. Limited Doppler analysis.  2. Left ventricular ejection fraction, by estimation, is 25%. The left ventricle has severely decreased function. The left ventricle demonstrates global hypokinesis. The left ventricular internal cavity size was moderately to severely dilated. Left ventricular diastolic function could not be evaluated.  3. The inferior  vena cava is normal in size with greater than 50%  respiratory variability, suggesting right atrial pressure of 3 mmHg. FINDINGS  Left Ventricle: Left ventricular ejection fraction, by estimation, is 25%. The left ventricle has severely decreased function. The left ventricle demonstrates global hypokinesis. The left ventricular internal cavity size was moderately to severely dilated. There is no left ventricular hypertrophy. Left ventricular diastolic function could not be evaluated. Left Atrium: Left atrial size was not assessed. Right Atrium: Right atrial size was not assessed. Pericardium: There is no evidence of pericardial effusion. Mitral Valve: The mitral valve is normal in structure. Tricuspid Valve: The tricuspid valve is not assessed. Aortic Valve: The aortic valve is grossly normal. Pulmonic Valve: The pulmonic valve was not assessed. Aorta: The aortic root is normal in size and structure. Venous: The inferior vena cava is normal in size with greater than 50% respiratory variability, suggesting right atrial pressure of 3 mmHg. Additional Comments: Spectral Doppler performed. Color Doppler performed.  LEFT VENTRICLE PLAX 2D LVIDd:         6.50 cm LVIDs:         5.90 cm LV PW:         1.10 cm LV IVS:        0.80 cm  LV Volumes (MOD) LV vol d, MOD A2C: 250.0 ml LV vol d, MOD A4C: 218.0 ml LV vol s, MOD A2C: 184.0 ml LV vol s, MOD A4C: 167.0 ml LV SV MOD A2C:     66.0 ml LV SV MOD A4C:     218.0 ml LV SV MOD BP:      59.8 ml LEFT ATRIUM         Index LA diam:    5.60 cm 3.08 cm/m Vishnu Priya Mallipeddi Electronically signed by Winfield Rast Mallipeddi Signature Date/Time: 08/16/2022/11:23:07 AM    Final    DG Chest 2 View  Result Date: 08/15/2022 CLINICAL DATA:  Shortness of breath EXAM: CHEST - 2 VIEW COMPARISON:  Previous studies including the examination of 07/28/2022 FINDINGS: Transverse diameter of heart is increased. Increase in AP diameter of chest suggests COPD. Central pulmonary vessels are  prominent without signs of alveolar pulmonary edema. There is partial clearing of patchy infiltrates in right lower lung field. Right cardiac margin is better visualized in the current study. There are no new focal infiltrates. There is no pleural effusion or pneumothorax. IMPRESSION: There is partial clearing of infiltrates in right lower lung field suggesting resolving atelectasis/pneumonia. COPD. Cardiomegaly. Central pulmonary vessels are prominent, possibly suggesting mild CHF. Electronically Signed   By: Ernie Avena M.D.   On: 08/15/2022 15:58     Scheduled Meds:  atorvastatin  40 mg Oral QPM   carvedilol  12.5 mg Oral BID WC   clopidogrel  75 mg Oral Daily   enoxaparin (LOVENOX) injection  40 mg Subcutaneous Q24H   fluticasone furoate-vilanterol  1 puff Inhalation Daily   And   umeclidinium bromide  1 puff Inhalation Daily   furosemide  40 mg Intravenous Q12H   isosorbide mononitrate  60 mg Oral QPM   Continuous Infusions:   LOS: 1 day    Shon Hale M.D on 08/16/2022 at 5:45 PM  Go to [email or web address] - for contact info  [facility] - Office  [phone]  If 7PM-7AM, please contact night-coverage [email or web address] 08/16/2022, 5:45 PM

## 2022-08-16 NOTE — NC FL2 (Signed)
[place] MEDICAID FL2 LEVEL OF CARE FORM     IDENTIFICATION  Patient Name: Michael [name] Birthdate: 05-Nov-1955 Sex: male Admission Date (Current Location): 08/15/2022  [place] and [place] Number:  Aaron Edelman [id number] S Facility and Address:  [facility],  [address] [place] [address]      Provider Number: [id number]  Attending Physician Name and Address:  Shon Hale, MD  Relative Name and Phone Number:  [name] Clearence Cheek)  [phone]    Current Level of Care: Hospital Recommended Level of Care: Skilled Nursing Facility Prior Approval Number:    Date Approved/Denied:   PASRR Number: [id number]  Discharge Plan: SNF    Current Diagnoses: Patient Active Problem List   Diagnosis Date Noted   Calculus of gallbladder without cholecystitis without obstruction 08/10/2022   Elevated troponin level not due myocardial infarction 07/29/2022   Acute on chronic combined systolic and diastolic CHF (congestive heart failure) (HCC) 07/24/2022   History of malignant neoplasm of skin 05/17/2022   Nicotine dependence, cigarettes, uncomplicated 05/17/2022   Noncompliance with treatment 05/17/2022   Orthostatic hypotension 05/17/2022   Vitamin D deficiency 05/17/2022   Troponin level elevated 04/29/2022   Abnormal level of hormones in specimens from other organs, systems and tissues 04/29/2022   Solitary pulmonary nodule 04/26/2022   First degree heart block 04/12/2022   Fracture of rib 02/27/2022   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   COPD (chronic obstructive pulmonary disease) (HCC) 11/03/2021   Acute non-ST segment elevation myocardial infarction (HCC) 11/03/2021   Aneurysm of infrarenal abdominal aorta (HCC) 11/03/2021   Functional gait abnormality 11/03/2021   Hypoalbuminemia 11/03/2021   Hypomagnesemia 11/03/2021   Mixed hyperlipidemia 11/03/2021   Moderate protein-calorie malnutrition (HCC) 11/03/2021   Physical deconditioning  11/03/2021   Proteinuria 11/03/2021   Transaminitis 10/23/2021   Pressure injury of skin 10/22/2021   Type 2 MI (myocardial infarction) (HCC) 10/20/2021   Myocardial infarction (HCC) 10/20/2021   Atrophy of pancreas 08/11/2021   Mass of colon 08/06/2021   Preoperative cardiovascular examination 08/03/2021   CAD (coronary artery disease) 08/03/2021   Chronic kidney disease, stage 3b (HCC) 08/03/2021   Hardening of the aorta (main artery of the heart) (HCC) 08/03/2021   Rectal hemorrhage 06/16/2021   History of adenomatous polyp of colon 06/16/2021   Carotid stenosis, asymptomatic, right 02/15/2021   Occlusion and stenosis of right carotid artery 02/15/2021   Basal cell carcinoma of [place], right 12/06/2019   Incisional hernia 11/15/2019   Elevated PSA 06/19/2019   Benign prostatic hyperplasia with urinary obstruction 10/29/2018   CAP (community acquired pneumonia) 05/09/2018   Hypertensive emergency 05/09/2018   Chronic HFrEF (heart failure with reduced ejection fraction) (HCC) 05/09/2018   Lobar pneumonia (HCC) 05/09/2018   Retention of urine 11/27/2017   Vasovagal syncope 09/17/2017   Sepsis due to urinary tract infection (HCC) 09/01/2017   Syncope and collapse 07/05/2017   COPD with acute exacerbation (HCC) 07/05/2017   Acute kidney injury superimposed on CKD (HCC) 07/05/2017   Urinary tract infectious disease 07/05/2017   Leukocytosis 07/05/2017   Incomplete bladder emptying 07/05/2017   UTI (urinary tract infection) 07/05/2017   Disorder of carotid artery (HCC) 06/25/2017   Orthostatic syncope    Syncope due to orthostatic hypotension 06/24/2017   Cardiac murmur 06/24/2017   Prolonged QT interval 06/24/2017   Imaging of gastrointestinal tract abnormal 05/25/2017   Basal cell carcinoma of eyelid 05/15/2017   Basal cell carcinoma (BCC) of nostril 05/12/2017   Skin lesion of face 05/03/2017  Other specified disorders of nose and nasal sinuses 05/03/2017   Periapical  abscess 04/04/2017   Iron deficiency anemia 04/04/2017   Essential hypertension 04/02/2017   Anemia of chronic disease 04/02/2017   Acute hypoxemic respiratory failure (HCC) 04/02/2017   Lactic acidosis 04/02/2017   Tobacco user 04/02/2017   Thrombocytosis 04/02/2017   Hypokalemia 04/02/2017   Unspecified diastolic (congestive) heart failure (HCC) 04/02/2017   Nasal lesion     Orientation RESPIRATION BLADDER Height & Weight     Self, Time, Situation, Place  Normal Continent Weight: 138 lb 0.1 oz (62.6 kg) Height:  6' (182.9 cm)  BEHAVIORAL SYMPTOMS/MOOD NEUROLOGICAL BOWEL NUTRITION STATUS      Continent Diet (heart healthy)  AMBULATORY STATUS COMMUNICATION OF NEEDS Skin   Limited Assist Verbally Normal                       Personal Care Assistance Level of Assistance  Bathing, Feeding, Dressing Bathing Assistance: Limited assistance Feeding assistance: Independent Dressing Assistance: Limited assistance     Functional Limitations Info  Sight, Hearing, Speech Sight Info: Adequate Hearing Info: Adequate Speech Info: Adequate    SPECIAL CARE FACTORS FREQUENCY  PT (By licensed PT)     PT Frequency: 5x/week              Contractures Contractures Info: Not present    Additional Factors Info  Code Status, Allergies Code Status Info: Full Code Allergies Info: NKA           Current Medications (08/16/2022):  This is the current hospital active medication list Current Facility-Administered Medications  Medication Dose Route Frequency Provider Last Rate Last Admin   acetaminophen (TYLENOL) tablet 650 mg  650 mg Oral Q6H PRN Adefeso, Oladapo, DO       Or   acetaminophen (TYLENOL) suppository 650 mg  650 mg Rectal Q6H PRN Adefeso, Oladapo, DO       albuterol (PROVENTIL) (2.5 MG/3ML) 0.083% nebulizer solution 2.5 mg  2.5 mg Nebulization Q6H PRN Adefeso, Oladapo, DO       atorvastatin (LIPITOR) tablet 40 mg  40 mg Oral QPM Adefeso, Oladapo, DO       carvedilol  (COREG) tablet 12.5 mg  12.5 mg Oral BID WC Jacolyn Reedy M, PA-C   12.5 mg at 08/16/22 1610   clopidogrel (PLAVIX) tablet 75 mg  75 mg Oral Daily Adefeso, Oladapo, DO   75 mg at 08/16/22 0923   enoxaparin (LOVENOX) injection 40 mg  40 mg Subcutaneous Q24H Adefeso, Oladapo, DO   40 mg at 08/15/22 2123   fluticasone furoate-vilanterol (BREO ELLIPTA) 200-25 MCG/ACT 1 puff  1 puff Inhalation Daily Emokpae, Courage, MD   1 puff at 08/16/22 9604   And   umeclidinium bromide (INCRUSE ELLIPTA) 62.5 MCG/ACT 1 puff  1 puff Inhalation Daily Emokpae, Courage, MD   1 puff at 08/16/22 0822   furosemide (LASIX) injection 40 mg  40 mg Intravenous Q12H Adefeso, Oladapo, DO   40 mg at 08/16/22 5409   isosorbide mononitrate (IMDUR) 24 hr tablet 60 mg  60 mg Oral QPM Adefeso, Oladapo, DO       prochlorperazine (COMPAZINE) injection 10 mg  10 mg Intravenous Q6H PRN Adefeso, Oladapo, DO         Discharge Medications: Please see discharge summary for a list of discharge medications.  Relevant Imaging Results:  Relevant Lab Results:   Additional Information SSN [id number], Juleen China, LCSW

## 2022-08-17 DIAGNOSIS — I5043 Acute on chronic combined systolic (congestive) and diastolic (congestive) heart failure: Secondary | ICD-10-CM | POA: Diagnosis not present

## 2022-08-17 LAB — BASIC METABOLIC PANEL
Anion gap: 7 (ref 5–15)
BUN: 27 mg/dL — ABNORMAL HIGH (ref 8–23)
CO2: 27 mmol/L (ref 22–32)
Calcium: 8.1 mg/dL — ABNORMAL LOW (ref 8.9–10.3)
Chloride: 105 mmol/L (ref 98–111)
Creatinine, Ser: 1.95 mg/dL — ABNORMAL HIGH (ref 0.61–1.24)
GFR, Estimated: 37 mL/min — ABNORMAL LOW (ref >60)
Glucose, Bld: 108 mg/dL — ABNORMAL HIGH (ref 70–99)
Potassium: 3.1 mmol/L — ABNORMAL LOW (ref 3.5–5.1)
Sodium: 139 mmol/L (ref 135–145)

## 2022-08-17 MED ORDER — TORSEMIDE 20 MG PO TABS
20.0000 mg | ORAL_TABLET | Freq: Two times a day (BID) | ORAL | Status: DC
  Administered 2022-08-17 – 2022-08-18 (×2): 20 mg via ORAL
  Filled 2022-08-17 (×2): qty 1

## 2022-08-17 MED ORDER — POTASSIUM CHLORIDE CRYS ER 20 MEQ PO TBCR
40.0000 meq | EXTENDED_RELEASE_TABLET | Freq: Every day | ORAL | Status: DC
  Administered 2022-08-17: 40 meq via ORAL
  Filled 2022-08-17 (×2): qty 2

## 2022-08-17 MED ORDER — LOSARTAN POTASSIUM 50 MG PO TABS
25.0000 mg | ORAL_TABLET | Freq: Every day | ORAL | Status: DC
  Administered 2022-08-17 – 2022-08-18 (×2): 25 mg via ORAL
  Filled 2022-08-17 (×2): qty 1

## 2022-08-17 NOTE — Progress Notes (Signed)
Progress Note  Patient Name: Michael [name] Date of Encounter: 08/17/2022  Primary Cardiologist: Nona Dell, MD  Subjective   No acute events overnight.  SOB improved significantly.  Inpatient Medications    Scheduled Meds:  atorvastatin  40 mg Oral QPM   carvedilol  12.5 mg Oral BID WC   clopidogrel  75 mg Oral Daily   enoxaparin (LOVENOX) injection  40 mg Subcutaneous Q24H   fluticasone furoate-vilanterol  1 puff Inhalation Daily   And   umeclidinium bromide  1 puff Inhalation Daily   furosemide  40 mg Intravenous Q12H   losartan  25 mg Oral Daily   potassium chloride  40 mEq Oral Daily   Continuous Infusions:  PRN Meds: acetaminophen **OR** acetaminophen, albuterol, prochlorperazine   Vital Signs    Vitals:   08/16/22 1100 08/16/22 1959 08/17/22 0534 08/17/22 0707  BP: 123/74 (!) 113/92 108/69   Pulse: 62 61 62   Resp: (!) 24 16 18    Temp: 97.7 F (36.5 C) (!) 97.5 F (36.4 C) 98.1 F (36.7 C)   TempSrc: Oral Oral Oral   SpO2: 100% 98% 97% 97%  Weight:   61.9 kg   Height:        Intake/Output Summary (Last 24 hours) at 08/17/2022 1028 Last data filed at 08/17/2022 0816 Gross per 24 hour  Intake 960 ml  Output 1650 ml  Net -690 ml   Filed Weights   08/15/22 2016 08/16/22 0500 08/17/22 0534  Weight: 64.5 kg 62.6 kg 61.9 kg    Telemetry     Personally reviewed.  ECG      Physical Exam   GEN: No acute distress.   Neck: No JVD. Cardiac: RRR, no murmur, rub, or gallop.  Respiratory: Nonlabored. Clear to auscultation bilaterally. GI: Soft, nontender, bowel sounds present. MS: No edema; No deformity. Neuro:  Nonfocal. Psych: Alert and oriented x 3. Normal affect.  Labs    Chemistry Recent Labs  Lab 08/11/22 0927 08/15/22 1513 08/16/22 0408 08/17/22 0412  NA 138 140 141 139  K 3.8 3.7 3.4* 3.1*  CL 105 106 105 105  CO2 26 26 25 27   GLUCOSE 138* 106* 189* 108*  BUN 23 24* 24* 27*  CREATININE 1.84* 2.05* 1.93* 1.95*  CALCIUM  8.8* 8.9 8.6* 8.1*  PROT 7.2  --  6.7  --   ALBUMIN 3.4*  --  3.2*  --   AST 36  --  30  --   ALT 27  --  24  --   ALKPHOS 134*  --  124  --   BILITOT 1.3*  --  1.2  --   GFRNONAA 39* 35* 37* 37*  ANIONGAP 7 8 11 7      Hematology Recent Labs  Lab 08/11/22 0927 08/15/22 1513 08/16/22 0408  WBC 8.2 7.0 3.0*  RBC 4.24 4.15* 4.19*  HGB 12.0* 11.6* 11.6*  HCT 38.5* 37.7* 37.7*  MCV 90.8 90.8 90.0  MCH 28.3 28.0 27.7  MCHC 31.2 30.8 30.8  RDW 14.8 14.8 14.7  PLT 230 240 227    Cardiac Enzymes Recent Labs  Lab 07/28/22 1800 07/28/22 2228  TROPONINIHS 89* 93*    BNP Recent Labs  Lab 08/15/22 1513  BNP >4,500.0*     DDimerNo results for input(s): "DDIMER" in the last 168 hours.   Radiology    ECHOCARDIOGRAM LIMITED  Result Date: 08/16/2022    ECHOCARDIOGRAM LIMITED REPORT   [name] Date of Exam:  08/16/2022 Medical Rec #:  [id number]     Height:       72.0 in Accession #:    [id number]    Weight:       138.0 lb Date of Birth:  Jan 13, 1956     BSA:          1.820 m Patient Age:    67 years      BP:           154/93 mmHg Patient Gender: M             HR:           85 bpm. [facility] Procedure: Limited Echo, Limited Color Doppler and Cardiac Doppler Indications:    CHF-Acute Systolic I50.21  History:        Patient has prior history of Echocardiogram examinations, most                 recent 07/29/2022. CHF and Cardiomyopathy, CAD, COPD and Carotid                 Disease; Risk Factors:Hypertension, Dyslipidemia and Former                 Smoker.  Sonographer:    Aron Baba Referring Phys: [id number] OLADAPO ADEFESO IMPRESSIONS  1. Limited Echo for LVEF assessment. Limited Doppler analysis.  2. Left ventricular ejection fraction, by estimation, is 25%. The left ventricle has severely decreased function. The left ventricle demonstrates global hypokinesis. The left ventricular internal cavity size was moderately to severely dilated. Left ventricular  diastolic function could not be evaluated.  3. The inferior vena cava is normal in size with greater than 50% respiratory variability, suggesting right atrial pressure of 3 mmHg. FINDINGS  Left Ventricle: Left ventricular ejection fraction, by estimation, is 25%. The left ventricle has severely decreased function. The left ventricle demonstrates global hypokinesis. The left ventricular internal cavity size was moderately to severely dilated. There is no left ventricular hypertrophy. Left ventricular diastolic function could not be evaluated. Left Atrium: Left atrial size was not assessed. Right Atrium: Right atrial size was not assessed. Pericardium: There is no evidence of pericardial effusion. Mitral Valve: The mitral valve is normal in structure. Tricuspid Valve: The tricuspid valve is not assessed. Aortic Valve: The aortic valve is grossly normal. Pulmonic Valve: The pulmonic valve was not assessed. Aorta: The aortic root is normal in size and structure. Venous: The inferior vena cava is normal in size with greater than 50% respiratory variability, suggesting right atrial pressure of 3 mmHg. Additional Comments: Spectral Doppler performed. Color Doppler performed.  LEFT VENTRICLE PLAX 2D LVIDd:         6.50 cm LVIDs:         5.90 cm LV PW:         1.10 cm LV IVS:        0.80 cm  LV Volumes (MOD) LV vol d, MOD A2C: 250.0 ml LV vol d, MOD A4C: 218.0 ml LV vol s, MOD A2C: 184.0 ml LV vol s, MOD A4C: 167.0 ml LV SV MOD A2C:     66.0 ml LV SV MOD A4C:     218.0 ml LV SV MOD BP:      59.8 ml LEFT ATRIUM         Index LA diam:    5.60 cm 3.08 cm/m Tomaz Janis Priya Lamanda Rudder Electronically signed by Winfield Rast Tashiya Souders Signature Date/Time: 08/16/2022/11:23:07 AM    Final  DG Chest 2 View  Result Date: 08/15/2022 CLINICAL DATA:  Shortness of breath EXAM: CHEST - 2 VIEW COMPARISON:  Previous studies including the examination of 07/28/2022 FINDINGS: Transverse diameter of heart is increased. Increase in AP diameter  of chest suggests COPD. Central pulmonary vessels are prominent without signs of alveolar pulmonary edema. There is partial clearing of patchy infiltrates in right lower lung field. Right cardiac margin is better visualized in the current study. There are no new focal infiltrates. There is no pleural effusion or pneumothorax. IMPRESSION: There is partial clearing of infiltrates in right lower lung field suggesting resolving atelectasis/pneumonia. COPD. Cardiomegaly. Central pulmonary vessels are prominent, possibly suggesting mild CHF. Electronically Signed   By: Ernie Avena M.D.   On: 08/15/2022 15:58     Assessment & Plan    # NICM LVEF 25% with no device -Patient is able to lay flat and walk in the hallway with no symptoms of DOE or angina. Switch IV Lasix 40 mg to p.o. torsemide 20 mg twice daily. -Continue carvedilol 12.5 mg twice daily -Start losartan 25 mg once daily -Stop Imdur -Outpatient cardiology follow-up -Palliative care recommended " treat the treatable" hospice care no CPR or intubation.  # CAD manifested by ACS in 9/23 (70% mid RCA and 80% OM) -DAPT for 1 year till 9/24. Continue Plavix 75 mg once daily. -Continue high intensity statin, atorvastatin 40 mg nightly.  Signed, Marjo Bicker, MD  08/17/2022, 10:28 AM

## 2022-08-17 NOTE — Progress Notes (Signed)
PROGRESS NOTE  Michael [name], is a 67 y.o. male, DOB - 03-15-1955, QIO:[id number]  Admit date - 08/15/2022   Admitting Physician Frankey Shown, DO  Outpatient Primary MD for the patient is Benita Stabile, MD  LOS - 2  Chief Complaint  Patient presents with   Shortness of Breath      Brief Narrative:   67 y.o. male with medical history significant of HFrEF (LVEF on echocardiogram in June 2024 was 25 to 30%), COPD, CAD, CKD stage IIIb, hypertension, tobacco abuse admitted on 08/15/2022 with acute combined diastolic and systolic CHF exacerbation    Subjective: The patient was seen and examined this morning, awake alert oriented, no acute distress, denies any chest pain, reporting improved lower extremity edema, improved shortness of breath  Hemodynamically stable    Assessment and Plan:  1)HFrEF--acute exacerbation of combined diastolic and systolic dysfunction CHF --presumed nonischemic cardiomyopathy  Stable reporting improved shortness of breath on room air now  -repeat echo from 08/16/2022 with EF of 25% similar to prior -Cardiology-following appreciate input --Continue IV Lasix  -Dailyweights and fluid input and output management  Intake/Output Summary (Last 24 hours) at 08/17/2022 1143 Last data filed at 08/17/2022 0816 Gross per 24 hour  Intake 960 ml  Output 1350 ml  Net -390 ml   Filed Weights   08/15/22 2016 08/16/22 0500 08/17/22 0534  Weight: 64.5 kg 62.6 kg 61.9 kg      2)H/o CAD--LHC from 10/20/2021 with mild to moderate CAD -Chest pain-free, -Continue medical management with isosorbide, carvedilol, atorvastatin and Plavix  3)-CKD stage -3B    -creatinine on admission= 2.05,  -baseline creatinine = 1.8-2.0     Lab Results  Component Value Date   CREATININE 1.95 (H) 08/17/2022   CREATININE 1.93 (H) 08/16/2022   CREATININE 2.05 (H) 08/15/2022     -- renally adjust medications, avoid nephrotoxic agents / dehydration  / hypotension  4)Social/ethics---  patient lives with his neighbors/roommate -Palliative care consult appreciated patient is a DNR/DNI  5)COPD--no acute exacerbation,  -No further steroids continue bronchodilators -No hypoxia  6)Hypokalemia--due to diuretics, magnesium WNL -Replace  Status is: Inpatient   Disposition: The patient is from: Home              Anticipated d/c is to: Patient is homeless, consulted TOC for placement               Anticipated d/c date is: 2 days              Patient currently is not medically stable to d/c.     Code Status :  -  Code Status: DNR   Family Communication:    NA (patient is alert, awake and coherent)   DVT Prophylaxis  :   - SCDs  enoxaparin (LOVENOX) injection 40 mg Start: 08/15/22 2200 SCDs Start: 08/15/22 2008   Lab Results  Component Value Date   PLT 227 08/16/2022    Inpatient Medications Scheduled Meds:  atorvastatin  40 mg Oral QPM   carvedilol  12.5 mg Oral BID WC   clopidogrel  75 mg Oral Daily   enoxaparin (LOVENOX) injection  40 mg Subcutaneous Q24H   fluticasone furoate-vilanterol  1 puff Inhalation Daily   And   umeclidinium bromide  1 puff Inhalation Daily   losartan  25 mg Oral Daily   potassium chloride  40 mEq Oral Daily   torsemide  20 mg Oral BID   Continuous Infusions: PRN Meds:.acetaminophen **OR** acetaminophen, albuterol, prochlorperazine  Objective: Vitals:   08/16/22 1100 08/16/22 1959 08/17/22 0534 08/17/22 0707  BP: 123/74 (!) 113/92 108/69   Pulse: 62 61 62   Resp: (!) 24 16 18    Temp: 97.7 F (36.5 C) (!) 97.5 F (36.4 C) 98.1 F (36.7 C)   TempSrc: Oral Oral Oral   SpO2: 100% 98% 97% 97%  Weight:   61.9 kg   Height:        Intake/Output Summary (Last 24 hours) at 08/17/2022 1143 Last data filed at 08/17/2022 0816 Gross per 24 hour  Intake 960 ml  Output 1350 ml  Net -390 ml   Filed Weights   08/15/22 2016 08/16/22 0500 08/17/22 0534  Weight: 64.5 kg 62.6 kg 61.9 kg    Physical Exam  General:  AAO x 3,   cooperative, no distress;   HEENT:  Deformed nasolabial fold-normocephalic, PERRL, otherwise with in Normal limits   Neuro:  CNII-XII intact. , normal motor and sensation, reflexes intact   Lungs:   Clear to auscultation BL, Respirations unlabored,  No wheezes / crackles  Cardio:    S1/S2, RRR, No murmure, No Rubs or Gallops   Abdomen:  Soft, non-tender, bowel sounds active all four quadrants, no guarding or peritoneal signs.  Muscular  skeletal:  Limited exam -global generalized weaknesses - in bed, able to move all 4 extremities,   2+ pulses,  symmetric, +1 pitting edema  Skin:  Dry, warm to touch, negative for any Rashes,  Wounds: Please see nursing documentation          Data Reviewed: I have personally reviewed following labs and imaging studies  CBC: Recent Labs  Lab 08/11/22 0927 08/15/22 1513 08/16/22 0408  WBC 8.2 7.0 3.0*  NEUTROABS 5.7  --   --   HGB 12.0* 11.6* 11.6*  HCT 38.5* 37.7* 37.7*  MCV 90.8 90.8 90.0  PLT 230 240 227   Basic Metabolic Panel: Recent Labs  Lab 08/11/22 0927 08/15/22 1513 08/16/22 0408 08/17/22 0412  NA 138 140 141 139  K 3.8 3.7 3.4* 3.1*  CL 105 106 105 105  CO2 26 26 25 27   GLUCOSE 138* 106* 189* 108*  BUN 23 24* 24* 27*  CREATININE 1.84* 2.05* 1.93* 1.95*  CALCIUM 8.8* 8.9 8.6* 8.1*  MG  --   --  2.1  --   PHOS  --   --  3.2  --    GFR: Estimated Creatinine Clearance: 31.7 mL/min (A) (by C-G formula based on SCr of 1.95 mg/dL (H)). Liver Function Tests: Recent Labs  Lab 08/11/22 0927 08/16/22 0408  AST 36 30  ALT 27 24  ALKPHOS 134* 124  BILITOT 1.3* 1.2  PROT 7.2 6.7  ALBUMIN 3.4* 3.2*   Radiology Studies: ECHOCARDIOGRAM LIMITED  Result Date: 08/16/2022    ECHOCARDIOGRAM LIMITED REPORT   Patient Name:   COMPTON BRIGANCE Date of Exam: 08/16/2022 Medical Rec #:  [id number]     Height:       72.0 in Accession #:    [id number]    Weight:       138.0 lb Date of Birth:  13-Aug-1955     BSA:          1.820 m Patient Age:     67 years      BP:           154/93 mmHg Patient Gender: M             HR:  85 bpm. [facility] Procedure: Limited Echo, Limited Color Doppler and Cardiac Doppler Indications:    CHF-Acute Systolic I50.21  History:        Patient has prior history of Echocardiogram examinations, most                 recent 07/29/2022. CHF and Cardiomyopathy, CAD, COPD and Carotid                 Disease; Risk Factors:Hypertension, Dyslipidemia and Former                 Smoker.  Sonographer:    Aron Baba Referring Phys: [id number] OLADAPO ADEFESO IMPRESSIONS  1. Limited Echo for LVEF assessment. Limited Doppler analysis.  2. Left ventricular ejection fraction, by estimation, is 25%. The left ventricle has severely decreased function. The left ventricle demonstrates global hypokinesis. The left ventricular internal cavity size was moderately to severely dilated. Left ventricular diastolic function could not be evaluated.  3. The inferior vena cava is normal in size with greater than 50% respiratory variability, suggesting right atrial pressure of 3 mmHg. FINDINGS  Left Ventricle: Left ventricular ejection fraction, by estimation, is 25%. The left ventricle has severely decreased function. The left ventricle demonstrates global hypokinesis. The left ventricular internal cavity size was moderately to severely dilated. There is no left ventricular hypertrophy. Left ventricular diastolic function could not be evaluated. Left Atrium: Left atrial size was not assessed. Right Atrium: Right atrial size was not assessed. Pericardium: There is no evidence of pericardial effusion. Mitral Valve: The mitral valve is normal in structure. Tricuspid Valve: The tricuspid valve is not assessed. Aortic Valve: The aortic valve is grossly normal. Pulmonic Valve: The pulmonic valve was not assessed. Aorta: The aortic root is normal in size and structure. Venous: The inferior vena cava is normal in size with greater than 50%  respiratory variability, suggesting right atrial pressure of 3 mmHg. Additional Comments: Spectral Doppler performed. Color Doppler performed.  LEFT VENTRICLE PLAX 2D LVIDd:         6.50 cm LVIDs:         5.90 cm LV PW:         1.10 cm LV IVS:        0.80 cm  LV Volumes (MOD) LV vol d, MOD A2C: 250.0 ml LV vol d, MOD A4C: 218.0 ml LV vol s, MOD A2C: 184.0 ml LV vol s, MOD A4C: 167.0 ml LV SV MOD A2C:     66.0 ml LV SV MOD A4C:     218.0 ml LV SV MOD BP:      59.8 ml LEFT ATRIUM         Index LA diam:    5.60 cm 3.08 cm/m Vishnu Priya Mallipeddi Electronically signed by Winfield Rast Mallipeddi Signature Date/Time: 08/16/2022/11:23:07 AM    Final    DG Chest 2 View  Result Date: 08/15/2022 CLINICAL DATA:  Shortness of breath EXAM: CHEST - 2 VIEW COMPARISON:  Previous studies including the examination of 07/28/2022 FINDINGS: Transverse diameter of heart is increased. Increase in AP diameter of chest suggests COPD. Central pulmonary vessels are prominent without signs of alveolar pulmonary edema. There is partial clearing of patchy infiltrates in right lower lung field. Right cardiac margin is better visualized in the current study. There are no new focal infiltrates. There is no pleural effusion or pneumothorax. IMPRESSION: There is partial clearing of infiltrates in right lower lung field suggesting resolving atelectasis/pneumonia. COPD. Cardiomegaly. Central  pulmonary vessels are prominent, possibly suggesting mild CHF. Electronically Signed   By: Ernie Avena M.D.   On: 08/15/2022 15:58     Scheduled Meds:  atorvastatin  40 mg Oral QPM   carvedilol  12.5 mg Oral BID WC   clopidogrel  75 mg Oral Daily   enoxaparin (LOVENOX) injection  40 mg Subcutaneous Q24H   fluticasone furoate-vilanterol  1 puff Inhalation Daily   And   umeclidinium bromide  1 puff Inhalation Daily   losartan  25 mg Oral Daily   potassium chloride  40 mEq Oral Daily   torsemide  20 mg Oral BID   Continuous Infusions:    LOS: 2 days    Kendell Bane M.D on 08/17/2022 at 11:43 AM 35 minutes was spent in seeing evaluating and examining the patient, reviewing labs, reviewing medication, drawing plan of care discussing disposition with transition of care  Go to [email or web address] - for contact info  [facility] - Office  [phone]  If 7PM-7AM, please contact night-coverage [email or web address] 08/17/2022, 11:43 AM

## 2022-08-17 NOTE — Progress Notes (Signed)
Mobility Specialist Progress Note:   08/17/22 1048  Mobility  Activity Ambulated independently in room;Transferred from bed to chair  Level of Assistance Independent  Assistive Device None  Distance Ambulated (ft) 15 ft  Range of Motion/Exercises Active;All extremities  Activity Response Tolerated well  Mobility Referral Yes  $Mobility charge 1 Mobility  Mobility Specialist Start Time (ACUTE ONLY) 1030  Mobility Specialist Stop Time (ACUTE ONLY) 1045  Mobility Specialist Time Calculation (min) (ACUTE ONLY) 15 min   Pt received ambulating in room independently. Tolerated well, asx throughout. Pt is sitting up in chair, all needs met, call bell in reach.  Feliciana Rossetti Mobility Specialist Please contact via Special educational needs teacher or  Rehab office at [phone]

## 2022-08-17 NOTE — Plan of Care (Signed)

## 2022-08-17 NOTE — Plan of Care (Signed)

## 2022-08-17 NOTE — Evaluation (Signed)
Physical Therapy Evaluation Patient Details Name: Michael [name] MRN: [id number] DOB: Jul 29, 1955 Today's Date: 08/17/2022  History of Present Illness  Michael [name] is a 67 y.o. male with medical history significant of HFrEF (LVEF on echocardiogram in June 2024 was 25 to 30%), COPD, CAD, CKD stage IIIb, hypertension, tobacco abuse who presents to the emergency after being sent from his cardiologist office with complaint of shortness of breath and bilateral leg swelling since being discharged from the hospital on 6/27 due to similar presentation.  Patient states that he has been compliant with his Lasix except today due to having an appointment with his cardiologist.  On arrival to cardiologist office, patient was noted to be short of breath while sitting and was noted to have a 10 pound weight gain within the last 2 weeks.  He was advised to go to the ER for further evaluation and management.  He denies chest pain, palpitations, fever, chills, cough.  Patient was admitted from 6/27 through 07/31/2022 due to dyspnea secondary to acute on chronic HFrEF and dyspnea secondary to multifactorial including HFrEF, COPD and deconditioning  He was also admitted from 6/23 to 6/25 due to acute on chronic systolic CHF (LVEF from echo done in March 2024 was 35 to 40%).   Other hospitalizations include:  Hospital admission from 04/29/2022 to 05/03/2022 for syncope and COPD exacerbation.  Syncope was thought to be due to orthostasis.  His amlodipine and Entresto were discontinued during that hospitalization and he was discharged with prednisone taper and albuterol.   Patient was also admitted from 3/25 to 04/27/2022 due to elevated troponin that was deemed to be due to demand ischemia.  He also presented during that admission with asymptomatic NSVT.   Clinical Impression  Patient functioning at baseline for functional mobility and gait demonstrating good return for ambulating in room/hallways without loss of balance while on room  air with SpO2 at 100%.  Patient encouraged to ambulate in room/hallways as tolerated for length of stay.  Plan:  Patient discharged from physical therapy to care of nursing for ambulation daily as tolerated for length of stay.          Assistance Recommended at Discharge PRN  If plan is discharge home, recommend the following:  Can travel by private vehicle  Other (comment) (near baseline)        Equipment Recommendations None recommended by PT  Recommendations for Other Services       Functional Status Assessment Patient has not had a recent decline in their functional status     Precautions / Restrictions Precautions Precautions: None Restrictions Weight Bearing Restrictions: No      Mobility  Bed Mobility Overal bed mobility: Independent                  Transfers Overall transfer level: Modified independent                      Ambulation/Gait Ambulation/Gait assistance: Modified independent (Device/Increase time) Gait Distance (Feet): 150 Feet Assistive device: None Gait Pattern/deviations: WFL(Within Functional Limits) Gait velocity: decreased     General Gait Details: grossly WFL demonstrating slightly labored cadence without loss of balance with good return for ambulating in room/hallways  Stairs            Wheelchair Mobility     Tilt Bed    Modified Rankin (Stroke Patients Only)       Balance Overall balance assessment: No apparent balance deficits (not formally assessed)  Pertinent Vitals/Pain Pain Assessment Pain Assessment: No/denies pain    Home Living Family/patient expects to be discharged to:: Private residence Living Arrangements: Non-relatives/Friends Available Help at Discharge: Friend(s);Available PRN/intermittently Type of Home: House Home Access: Ramped entrance       Home Layout: One level Home Equipment: None      Prior Function  Prior Level of Function : Independent/Modified Independent             Mobility Comments: Household and short distanced community ambulator without use of an AD, does not drive ADLs Comments: Independent     Hand Dominance   Dominant Hand: Right    Extremity/Trunk Assessment   Upper Extremity Assessment Upper Extremity Assessment: Overall WFL for tasks assessed    Lower Extremity Assessment Lower Extremity Assessment: Overall WFL for tasks assessed    Cervical / Trunk Assessment Cervical / Trunk Assessment: Normal  Communication   Communication: No difficulties  Cognition Arousal/Alertness: Awake/alert Behavior During Therapy: WFL for tasks assessed/performed Overall Cognitive Status: Within Functional Limits for tasks assessed                                          General Comments      Exercises     Assessment/Plan    PT Assessment Patient does not need any further PT services  PT Problem List         PT Treatment Interventions      PT Goals (Current goals can be found in the Care Plan section)  Acute Rehab PT Goals Patient Stated Goal: return home with friends to assist PT Goal Formulation: With patient Time For Goal Achievement: 08/17/22 Potential to Achieve Goals: Good    Frequency       Co-evaluation               AM-PAC PT "6 Clicks" Mobility  Outcome Measure Help needed turning from your back to your side while in a flat bed without using bedrails?: None Help needed moving from lying on your back to sitting on the side of a flat bed without using bedrails?: None Help needed moving to and from a bed to a chair (including a wheelchair)?: None Help needed standing up from a chair using your arms (e.g., wheelchair or bedside chair)?: None Help needed to walk in hospital room?: None Help needed climbing 3-5 steps with a railing? : None 6 Click Score: 24    End of Session   Activity Tolerance: Patient tolerated  treatment well Patient left: in chair;with call bell/phone within reach Nurse Communication: Mobility status PT Visit Diagnosis: Unsteadiness on feet (R26.81);Other abnormalities of gait and mobility (R26.89);Muscle weakness (generalized) (M62.81)    Time: 0945-1000 PT Time Calculation (min) (ACUTE ONLY): 15 min   Charges:   PT Evaluation $PT Eval Low Complexity: 1 Low PT Treatments $Gait Training: 8-22 mins PT General Charges $$ ACUTE PT VISIT: 1 Visit         11:57 AM, 08/17/22 Ocie Bob, MPT Physical Therapist with [facility] [phone] office [phone] mobile phone

## 2022-08-17 NOTE — Progress Notes (Signed)
OT Cancellation Note  Patient Details Name: Michael [name] MRN: [id number] DOB: 1956/01/20   Cancelled Treatment:    Reason Eval/Treat Not Completed: OT screened, no needs identified, will sign off. Pt able to ambulate, transfer to toilet, and demonstrated WFL B UE functional use.   Darden Flemister OT, MOT   Danie Chandler 08/17/2022, 10:02 AM

## 2022-08-17 NOTE — Progress Notes (Signed)
Palliative: Mr. [employer] is sitting up in the Geri chair in his room enjoying his lunch.  He continues to be chronically ill and frail.  He continues to endorse DNR status and is considering hospice care.  No questions at this time.  Conference with attending, bedside nursing staff, transition of care team related to patient condition, needs, goals of care, disposition.  Plan: At this point continue to treat the treatable but no CPR or intubation.  Time for outcomes.  Would benefit from hospice care but declines.  Did not qualify for short-term rehab but unsure of disposition plan at this point as his friend/current landlord is expressing their concern about their ability to continue assisting him with needs and care.    Mr. Guitron was set up with [facility] home health services upon discharge 07/26/2022 with services to include PT/OT/RN/SW  .  Mr. Peets has been in contact with Elliot Cousin a registered nurse with [facility] for potential care management service needs.   35 minutes  Lillia Carmel, NP Palliative medicine team Team phone [phone] Greater than 50% of this time was spent counseling and coordinating care related to the above assessment and plan.

## 2022-08-17 NOTE — Consult Note (Signed)
[facility] Customer service manager [facility]) Accountable Care Organization (ACO) [facility] Liaison Note  08/17/2022  Ajai Harville [birth year] [id number]  Location: [facility] Liaison screened the patient remotely at [facility].  Insurance: [employer]   Kedarius Aloisi is a 67 y.o. male who is a Primary Care Patient of [name], [name], MD. The patient was screened for 30 day readmission hospitalization with noted extreme risk score for unplanned readmission risk with 6 IP/ 7 ED in 6 months.  The patient was assessed for potential [facility]) Care Management service needs for post hospital transition for care coordination. Review of patient's electronic medical record reveals patient  was admitted with CHF. Collaborated with TOC on sister's request for LTACT and MCD. States pt has applied for MCD pending and liaison verified [facility] care coordinator is involved. Liaison requested social worker via [facility] to initial care for this pt for possible placement and family resources. Message  information to confidential voice mail and in-basket to [employer] (covering for Marval Regal).  Pt will discharged home with [facility] ([facility]) involve for PT and sister. Liaison will request social worker via [facility] to assist further.  Plan: [facility] Liaison will continue to follow progress and disposition to asess for post hospital community care coordination/management needs.  Referral request for community care coordination: pending disposition.   [facility] Care Management/Population Health does not replace or interfere with any arrangements made by the Inpatient Transition of Care team.   For questions contact:   Elliot Cousin, RN, [facility] Liaison [place]   Population Health Office Hours MTWF  8:00 am-6:00 pm Off on Thursday [phone] mobile [phone] [Office toll free line] Office Hours are M-F 8:30 - 5 pm 24 hour nurse advise line [phone] Concierge   [email or web address]

## 2022-08-17 NOTE — TOC Progression Note (Addendum)
Transition of Care [facility]) - Progression Note    Patient Details  Name: Michael [name] MRN: [id number] Date of Birth: [birth year]  Transition of Care [facility]) CM/SW Contact  Leitha Bleak, RN Phone Number: 08/17/2022, 1:31 PM  Clinical Narrative:   PT had no follow up recommendation. Palliative consulted. Patient agreeable to be DNR.  Patient is active with [facility] SW and has [employer] CM liaison. CM left messages with both ladies to have them continue to work with them on placement. Patient will need to pursue medicaid. CM also sending message to our financial advisor.   Addendum Misty Stanley [facility] called back. Edd Arbour his active [facility] worker. Misty Stanley will email Cala Bradford to assist in the community for placement.   Expected Discharge Plan: Home w Home Health Services Barriers to Discharge: Continued Medical Work up  Expected Discharge Plan and Services      Living arrangements for the past 2 months: Mobile Home                    Social Determinants of Health (SDOH) Interventions SDOH Screenings   Food Insecurity: No Food Insecurity (08/17/2022)  Housing: Low Risk  (08/17/2022)  Transportation Needs: No Transportation Needs (08/17/2022)  Utilities: Not At Risk (08/17/2022)  Alcohol Screen: Low Risk  (12/11/2019)  Depression (PHQ2-9): Low Risk  (08/02/2022)  Financial Resource Strain: Low Risk  (12/11/2019)  Physical Activity: Inactive (12/11/2019)  Social Connections: Socially Isolated (12/11/2019)  Stress: No Stress Concern Present (12/11/2019)  Tobacco Use: Medium Risk (08/16/2022)    Readmission Risk Interventions    07/25/2022    1:09 PM 04/26/2022   11:46 AM 10/26/2021    2:57 PM  Readmission Risk Prevention Plan  Transportation Screening Complete Complete Complete  PCP or Specialist Appt within 3-5 Days   Complete  HRI or Home Care Consult  Complete Complete  Social Work Consult for Recovery Care Planning/Counseling  Complete Complete  Palliative Care Screening  Not Applicable Not  Applicable  Medication Review Oceanographer) Complete Complete Complete  PCP or Specialist appointment within 3-5 days of discharge Not Complete    HRI or Home Care Consult Complete    SW Recovery Care/Counseling Consult Complete    Palliative Care Screening Complete    Skilled Nursing Facility Not Applicable

## 2022-08-18 ENCOUNTER — Encounter: Payer: Self-pay | Admitting: *Deleted

## 2022-08-18 DIAGNOSIS — I5043 Acute on chronic combined systolic (congestive) and diastolic (congestive) heart failure: Secondary | ICD-10-CM | POA: Diagnosis not present

## 2022-08-18 MED ORDER — TORSEMIDE 20 MG PO TABS: 20.0000 mg | ORAL_TABLET | Freq: Two times a day (BID) | ORAL | 2 refills | Status: DC

## 2022-08-18 MED ORDER — CARVEDILOL 12.5 MG PO TABS: 12.5000 mg | ORAL_TABLET | Freq: Two times a day (BID) | ORAL | 2 refills | Status: DC

## 2022-08-18 MED ORDER — POTASSIUM CHLORIDE CRYS ER 20 MEQ PO TBCR
40.0000 meq | EXTENDED_RELEASE_TABLET | Freq: Two times a day (BID) | ORAL | Status: DC
  Administered 2022-08-18: 40 meq via ORAL

## 2022-08-18 MED ORDER — LOSARTAN POTASSIUM 25 MG PO TABS: 25.0000 mg | ORAL_TABLET | Freq: Every day | ORAL | 2 refills | Status: DC

## 2022-08-18 NOTE — Plan of Care (Signed)
  Problem: Education: Goal: Knowledge of General Education information will improve Description Including pain rating scale, medication(s)/side effects and non-pharmacologic comfort measures Outcome: Progressing   Problem: Health Behavior/Discharge Planning: Goal: Ability to manage health-related needs will improve Outcome: Progressing   

## 2022-08-18 NOTE — Plan of Care (Signed)

## 2022-08-18 NOTE — Care Management Important Message (Signed)
Important Message  Patient Details  Name: Michael [name] MRN: [id number] Date of Birth: 01-Jul-1955   Medicare Important Message Given:  N/A - LOS <3 / Initial given by admissions     Corey Harold 08/18/2022, 1:11 PM

## 2022-08-18 NOTE — Progress Notes (Signed)
Progress Note  Patient Name: Michael [name] Date of Encounter: 08/18/2022  Primary Cardiologist: Nona Dell, MD  Subjective   No acute events overnight.  Denies symptoms of SOB.  Some dizziness.  Orthostatics are positive today.  Inpatient Medications    Scheduled Meds:  atorvastatin  40 mg Oral QPM   carvedilol  12.5 mg Oral BID WC   clopidogrel  75 mg Oral Daily   enoxaparin (LOVENOX) injection  40 mg Subcutaneous Q24H   fluticasone furoate-vilanterol  1 puff Inhalation Daily   And   umeclidinium bromide  1 puff Inhalation Daily   losartan  25 mg Oral Daily   potassium chloride  40 mEq Oral BID   torsemide  20 mg Oral BID   Continuous Infusions:  PRN Meds: acetaminophen **OR** acetaminophen, albuterol, prochlorperazine   Vital Signs    Vitals:   08/18/22 0349 08/18/22 0357 08/18/22 0812 08/18/22 0847  BP: 133/79   123/62  Pulse: 63   69  Resp: 16     Temp: 97.9 F (36.6 C)     TempSrc: Oral     SpO2: 98%  100% 98%  Weight:  61.1 kg    Height:        Intake/Output Summary (Last 24 hours) at 08/18/2022 0930 Last data filed at 08/17/2022 1725 Gross per 24 hour  Intake 480 ml  Output --  Net 480 ml   Filed Weights   08/16/22 0500 08/17/22 0534 08/18/22 0357  Weight: 62.6 kg 61.9 kg 61.1 kg    Telemetry     Personally reviewed, NSR  ECG    Not performed today  Physical Exam   GEN: No acute distress.   Neck: No JVD. Cardiac: RRR, no murmur, rub, or gallop.  Respiratory: Nonlabored. Clear to auscultation bilaterally. GI: Soft, nontender, bowel sounds present. MS: No edema; No deformity. Neuro:  Nonfocal. Psych: Alert and oriented x 3. Normal affect.  Labs    Chemistry Recent Labs  Lab 08/15/22 1513 08/16/22 0408 08/17/22 0412  NA 140 141 139  K 3.7 3.4* 3.1*  CL 106 105 105  CO2 26 25 27   GLUCOSE 106* 189* 108*  BUN 24* 24* 27*  CREATININE 2.05* 1.93* 1.95*  CALCIUM 8.9 8.6* 8.1*  PROT  --  6.7  --   ALBUMIN  --  3.2*  --    AST  --  30  --   ALT  --  24  --   ALKPHOS  --  124  --   BILITOT  --  1.2  --   GFRNONAA 35* 37* 37*  ANIONGAP 8 11 7      Hematology Recent Labs  Lab 08/15/22 1513 08/16/22 0408  WBC 7.0 3.0*  RBC 4.15* 4.19*  HGB 11.6* 11.6*  HCT 37.7* 37.7*  MCV 90.8 90.0  MCH 28.0 27.7  MCHC 30.8 30.8  RDW 14.8 14.7  PLT 240 227    Cardiac Enzymes Recent Labs  Lab 07/28/22 1800 07/28/22 2228  TROPONINIHS 89* 93*    BNP Recent Labs  Lab 08/15/22 1513  BNP >4,500.0*     DDimerNo results for input(s): "DDIMER" in the last 168 hours.   Radiology    ECHOCARDIOGRAM LIMITED  Result Date: 08/16/2022    ECHOCARDIOGRAM LIMITED REPORT   [name] Date of Exam: 08/16/2022 Medical Rec #:  [id number]     Height:       72.0 in Accession #:    [id number]  Weight:       138.0 lb Date of Birth:  Apr 16, 1955     BSA:          1.820 m Patient Age:    67 years      BP:           154/93 mmHg Patient Gender: M             HR:           85 bpm. [facility] Procedure: Limited Echo, Limited Color Doppler and Cardiac Doppler Indications:    CHF-Acute Systolic I50.21  History:        Patient has prior history of Echocardiogram examinations, most                 recent 07/29/2022. CHF and Cardiomyopathy, CAD, COPD and Carotid                 Disease; Risk Factors:Hypertension, Dyslipidemia and Former                 Smoker.  Sonographer:    Aron Baba Referring Phys: [id number] OLADAPO ADEFESO IMPRESSIONS  1. Limited Echo for LVEF assessment. Limited Doppler analysis.  2. Left ventricular ejection fraction, by estimation, is 25%. The left ventricle has severely decreased function. The left ventricle demonstrates global hypokinesis. The left ventricular internal cavity size was moderately to severely dilated. Left ventricular diastolic function could not be evaluated.  3. The inferior vena cava is normal in size with greater than 50% respiratory variability, suggesting right  atrial pressure of 3 mmHg. FINDINGS  Left Ventricle: Left ventricular ejection fraction, by estimation, is 25%. The left ventricle has severely decreased function. The left ventricle demonstrates global hypokinesis. The left ventricular internal cavity size was moderately to severely dilated. There is no left ventricular hypertrophy. Left ventricular diastolic function could not be evaluated. Left Atrium: Left atrial size was not assessed. Right Atrium: Right atrial size was not assessed. Pericardium: There is no evidence of pericardial effusion. Mitral Valve: The mitral valve is normal in structure. Tricuspid Valve: The tricuspid valve is not assessed. Aortic Valve: The aortic valve is grossly normal. Pulmonic Valve: The pulmonic valve was not assessed. Aorta: The aortic root is normal in size and structure. Venous: The inferior vena cava is normal in size with greater than 50% respiratory variability, suggesting right atrial pressure of 3 mmHg. Additional Comments: Spectral Doppler performed. Color Doppler performed.  LEFT VENTRICLE PLAX 2D LVIDd:         6.50 cm LVIDs:         5.90 cm LV PW:         1.10 cm LV IVS:        0.80 cm  LV Volumes (MOD) LV vol d, MOD A2C: 250.0 ml LV vol d, MOD A4C: 218.0 ml LV vol s, MOD A2C: 184.0 ml LV vol s, MOD A4C: 167.0 ml LV SV MOD A2C:     66.0 ml LV SV MOD A4C:     218.0 ml LV SV MOD BP:      59.8 ml LEFT ATRIUM         Index LA diam:    5.60 cm 3.08 cm/m Darean Rote Priya Quinnlyn Hearns Electronically signed by Winfield Rast Kaj Vasil Signature Date/Time: 08/16/2022/11:23:07 AM    Final      Assessment & Plan    # NICM LVEF 25% with no device # Orthostatic hypotension -Patient is able to lay flat and walk  in the hallway with no symptoms of DOE or angina. Patient is orthostatic this morning with dizziness symptoms. Hold GDMT, recheck orthostatics in the afternoon and administer GDMT. -Continue p.o. torsemide 20 mg twice daily. -Continue carvedilol 12.5 mg twice  daily -Continue losartan 25 mg once daily -Outpatient cardiology follow-up -Palliative care recommended " treat the treatable" hospice care no CPR or intubation.  # CAD manifested by ACS in 9/23 (70% mid RCA and 80% OM) -DAPT for 1 year till 9/24. Continue Plavix 75 mg once daily. -Continue high intensity statin, atorvastatin 40 mg nightly.  [facility] will sign off.   Medication Recommendations: After orthostatic hypotension is resolved, p.o. torsemide 20 mg twice daily, carvedilol 12.5 mg twice daily, losartan 25 mg once daily. Other recommendations (labs, testing, etc): None Follow up as an outpatient: Outpatient cardiology follow-up in 2 weeks upon discharge   Signed, Srinidhi Landers P Idelle Reimann, MD  08/18/2022, 9:30 AM

## 2022-08-18 NOTE — Patient Outreach (Signed)
  Care Coordination   Multidisciplinary  Visit Note   08/18/2022 Name: Michael [name] MRN: [id number] DOB: 1955-06-22  Michael [name] is a 67 y.o. year old male who sees Michael [name], Michael Hazel, MD for primary care. I  collaborated with Michael [name], [facility] Liaison, Michael [name], CMA Care Guide, and Michael Bad, LCSW by staff message today.   What matters to the patients health and wellness today?  Obtaining medicaid and long term care placement. Current plan is to return home with [facility] and pursue LTC after medicaid application has been approved.     Goals Addressed             This Visit's Progress    Care Coordination Services       Care Coordination Goals: Patient will discharge home with assistance from family Patient will resume Home Health services with [facility] Patient/family will work with palliative care. Order placed while inpatient.  Patient will work with [facility] Care Management Dept regarding LTC placement once discharged Patient will follow-up with DSS re: Medicaid application status as needed        SDOH assessments and interventions completed:  No     Care Coordination Interventions:  Yes, provided  Interventions Today    Flowsheet Row Most Recent Value  Chronic Disease   Chronic disease during today's visit Congestive Heart Failure (CHF), Chronic Obstructive Pulmonary Disease (COPD)  General Interventions   General Interventions Discussed/Reviewed Doctor Visits, Communication with  [reviewed inpatient notes and plan of care]  Communication with RN  Michael Cousin, RN, [facility] re: need for LTC placement. Staff message sent to Michael [name] to sched phone call w/ Michael Chapel, LCSW re: medicaid and LTC options. Collaborated with Michael [name] and will update Michael Regal, RN when she returns next week.]  Advanced Directive Interventions   Advanced Directives Discussed/Reviewed End of Life  End of Life Palliative  [Patient has agreed to  palliative care referral and is a DNR]       Follow up plan:  Patient will be outreached by scheduling care guide to setup a telephone appt with Michael Bad, LCSW. This RNCC will communicate with Michael Regal, RN next week when she returns. Michael [name] is currently following patient outpatient for Care Management/Care Coordination services.     Encounter Outcome:  Pt. Visit Completed   Michael [name], BSN, RN-BC RN Care Coordinator [facility]  [facility] Direct Dial: [phone] Main #: [phone]

## 2022-08-18 NOTE — Discharge Summary (Signed)
Physician Discharge Summary   Patient: Michael [name] MRN: [id number] DOB: 1955/10/17  Admit date:     08/15/2022  Discharge date: 08/18/22  Discharge Physician: Kendell Bane   PCP: Benita Stabile, MD   Recommendations at discharge:   Follow-up with PCP and cardiologist within 1-2 weeks Continue taking current recommended medications Continue monitor daily weight (or symptoms such as shortness of breath lower extremity swelling ) any increase in weight 3-5 pounds, extra dose of torsemide 20 mg may be given,  Discharge Diagnoses: Principal Problem:   Acute on chronic combined systolic and diastolic CHF (congestive heart failure) (HCC) Active Problems:   Essential hypertension   Prolonged QT interval   Acute kidney injury superimposed on CKD (HCC)   CAD (coronary artery disease)   COPD (chronic obstructive pulmonary disease) (HCC)   Mixed hyperlipidemia    Michael [name] Is a 67 y.o. male with medical history significant of HFrEF (LVEF on echocardiogram in June 2024 was 25 to 30%), COPD, CAD, CKD stage IIIb, hypertension, tobacco abuse admitted on 08/15/2022 with acute combined diastolic and systolic CHF exacerbation    1)HFrEF--acute exacerbation of combined diastolic and systolic dysfunction CHF --presumed nonischemic cardiomyopathy  Stable reporting improved shortness of breath on room air now   -repeat echo from 08/16/2022 with EF of 25% similar to prior   -Cardiology-following appreciate input: Recommending GDMT and Cont.:    Torsemide 20 mg twice daily PO Carvedilol 12.5 mg twice daily Losartan 25 mg once daily     -Dailyweights and fluid input and output management   Intake/Output Summary (Last 24 hours) at 08/18/2022 1140 Last data filed at 08/18/2022 0900    Gross per 24 hour  Intake 720 ml  Output --  Net 720 ml         Filed Weights    08/16/22 0500 08/17/22 0534 08/18/22 0357  Weight: 62.6 kg 61.9 kg 61.1 kg          2)H/o CAD--LHC from 10/20/2021  with mild to moderate CAD -Chest pain-free, -Continue medical management with isosorbide, carvedilol, atorvastatin and Plavix   3)-CKD stage -3B    -creatinine on admission= 2.05,  -baseline creatinine = 1.8-2.0     Recent Labs       Lab Results  Component Value Date    CREATININE 1.95 (H) 08/17/2022    CREATININE 1.93 (H) 08/16/2022    CREATININE 2.05 (H) 08/15/2022          -- renally adjust medications, avoid nephrotoxic agents / dehydration  / hypotension   4)Social/ethics--- patient lives with his neighbors/roommate -Palliative care consult appreciated patient is a DNR/DNI   5)COPD--no acute exacerbation,  -No further steroids continue bronchodilators -No hypoxia   6)Hypokalemia--due to diuretics, magnesium WNL -Replace    Consultants: Cardiologist  Disposition: Home Diet recommendation:  Discharge Diet Orders (From admission, onward)     Start     Ordered   08/18/22 0000  Diet - low sodium heart healthy        08/18/22 1250           Carb modified diet DISCHARGE MEDICATION: Allergies as of 08/18/2022   No Known Allergies      Medication List     STOP taking these medications    furosemide 40 MG tablet Commonly known as: LASIX   isosorbide mononitrate 60 MG 24 hr tablet Commonly known as: IMDUR       TAKE these medications    acetaminophen 500 MG tablet Commonly known  as: TYLENOL Take 1,000 mg by mouth daily as needed for fever, headache or moderate pain.   albuterol 108 (90 Base) MCG/ACT inhaler Commonly known as: VENTOLIN HFA Inhale 2 puffs into the lungs every 6 (six) hours as needed for wheezing or shortness of breath.   atorvastatin 40 MG tablet Commonly known as: LIPITOR Take 1 tablet (40 mg total) by mouth every evening.   Breztri Aerosphere 160-9-4.8 MCG/ACT Aero Generic drug: Budeson-Glycopyrrol-Formoterol Inhale 2 puffs into the lungs 2 (two) times daily.   carvedilol 12.5 MG tablet Commonly known as: COREG Take 1  tablet (12.5 mg total) by mouth 2 (two) times daily with a meal. What changed:  medication strength how much to take   clopidogrel 75 MG tablet Commonly known as: PLAVIX Take 1 tablet (75 mg total) by mouth daily.   losartan 25 MG tablet Commonly known as: COZAAR Take 1 tablet (25 mg total) by mouth daily. Start taking on: August 19, 2022   potassium chloride SA 20 MEQ tablet Commonly known as: KLOR-CON M Take 1 tablet (20 mEq total) by mouth daily.   torsemide 20 MG tablet Commonly known as: DEMADEX Take 1 tablet (20 mg total) by mouth 2 (two) times daily.        Follow-up Information     Sharlene Dory, NP Follow up on 09/01/2022.   Specialty: Cardiology Why: Cardiology Follow-up on 09/01/2022 at 9:00 AM. Will be at the [facility] OFFICE. Contact information: [address] [place] [address] [phone]                Discharge Exam: Ceasar Mons Weights   08/16/22 0500 08/17/22 0534 08/18/22 0357  Weight: 62.6 kg 61.9 kg 61.1 kg       General:  AAO x 3,  cooperative, no distress;   HEENT:  Normocephalic, PERRL, otherwise with in Normal limits   Neuro:  CNII-XII intact. , normal motor and sensation, reflexes intact   Lungs:   Clear to auscultation BL, Respirations unlabored,  No wheezes / crackles  Cardio:    S1/S2, RRR, No murmure, No Rubs or Gallops   Abdomen:  Soft, non-tender, bowel sounds active all four quadrants, no guarding or peritoneal signs.  Muscular  skeletal:  Limited exam -global generalized weaknesses - in bed, able to move all 4 extremities,   2+ pulses,  symmetric, No pitting edema  Skin:  Dry, warm to touch, negative for any Rashes,  Wounds: Please see nursing documentation         Condition at discharge: fair  The results of significant diagnostics from this hospitalization (including imaging, microbiology, ancillary and laboratory) are listed below for reference.   Imaging Studies: ECHOCARDIOGRAM LIMITED  Result Date:  08/16/2022    ECHOCARDIOGRAM LIMITED REPORT   [name] Date of Exam: 08/16/2022 Medical Rec #:  [id number]     Height:       72.0 in Accession #:    [id number]    Weight:       138.0 lb Date of Birth:  [birth year]     BSA:          1.820 m Patient Age:    67 years      BP:           154/93 mmHg Patient Gender: M             HR:           85 bpm. [facility] Procedure: Limited Echo, Limited Color Doppler and Cardiac Doppler Indications:    CHF-Acute Systolic I50.21  History:        Patient has prior history of Echocardiogram examinations, most                 recent 07/29/2022. CHF and Cardiomyopathy, CAD, COPD and Carotid                 Disease; Risk Factors:Hypertension, Dyslipidemia and Former                 Smoker.  Sonographer:    Aron Baba Referring Phys: [id number] OLADAPO ADEFESO IMPRESSIONS  1. Limited Echo for LVEF assessment. Limited Doppler analysis.  2. Left ventricular ejection fraction, by estimation, is 25%. The left ventricle has severely decreased function. The left ventricle demonstrates global hypokinesis. The left ventricular internal cavity size was moderately to severely dilated. Left ventricular diastolic function could not be evaluated.  3. The inferior vena cava is normal in size with greater than 50% respiratory variability, suggesting right atrial pressure of 3 mmHg. FINDINGS  Left Ventricle: Left ventricular ejection fraction, by estimation, is 25%. The left ventricle has severely decreased function. The left ventricle demonstrates global hypokinesis. The left ventricular internal cavity size was moderately to severely dilated. There is no left ventricular hypertrophy. Left ventricular diastolic function could not be evaluated. Left Atrium: Left atrial size was not assessed. Right Atrium: Right atrial size was not assessed. Pericardium: There is no evidence of pericardial effusion. Mitral Valve: The mitral valve is normal in structure. Tricuspid Valve:  The tricuspid valve is not assessed. Aortic Valve: The aortic valve is grossly normal. Pulmonic Valve: The pulmonic valve was not assessed. Aorta: The aortic root is normal in size and structure. Venous: The inferior vena cava is normal in size with greater than 50% respiratory variability, suggesting right atrial pressure of 3 mmHg. Additional Comments: Spectral Doppler performed. Color Doppler performed.  LEFT VENTRICLE PLAX 2D LVIDd:         6.50 cm LVIDs:         5.90 cm LV PW:         1.10 cm LV IVS:        0.80 cm  LV Volumes (MOD) LV vol d, MOD A2C: 250.0 ml LV vol d, MOD A4C: 218.0 ml LV vol s, MOD A2C: 184.0 ml LV vol s, MOD A4C: 167.0 ml LV SV MOD A2C:     66.0 ml LV SV MOD A4C:     218.0 ml LV SV MOD BP:      59.8 ml LEFT ATRIUM         Index LA diam:    5.60 cm 3.08 cm/m Vishnu Priya Mallipeddi Electronically signed by Winfield Rast Mallipeddi Signature Date/Time: 08/16/2022/11:23:07 AM    Final    DG Chest 2 View  Result Date: 08/15/2022 CLINICAL DATA:  Shortness of breath EXAM: CHEST - 2 VIEW COMPARISON:  Previous studies including the examination of 07/28/2022 FINDINGS: Transverse diameter of heart is increased. Increase in AP diameter of chest suggests COPD. Central pulmonary vessels are prominent without signs of alveolar pulmonary edema. There is partial clearing of patchy infiltrates in right lower lung field. Right cardiac margin is better visualized in the current study. There are no new focal infiltrates. There is no pleural effusion or pneumothorax. IMPRESSION: There is partial clearing of infiltrates in right lower lung field suggesting resolving atelectasis/pneumonia. COPD. Cardiomegaly. Central pulmonary vessels are prominent, possibly suggesting  mild CHF. Electronically Signed   By: Ernie Avena M.D.   On: 08/15/2022 15:58   ECHOCARDIOGRAM COMPLETE  Result Date: 07/30/2022    ECHOCARDIOGRAM REPORT   Patient Name:   BARTOW ZYLSTRA Date of Exam: 07/29/2022 Medical Rec #:   [id number]     Height:       72.0 in Accession #:    [id number]    Weight:       143.7 lb Date of Birth:  1955/05/14     BSA:          1.852 m Patient Age:    67 years      BP:           124/70 mmHg Patient Gender: M             HR:           63 bpm. [facility] Procedure: 2D Echo, 3D Echo, Cardiac Doppler and Color Doppler Indications:    I25.10 CAD  History:        Patient has prior history of Echocardiogram examinations, most                 recent 04/30/2022. Cardiomyopathy, CAD, COPD, CKD 3 and Carotid                 Disease; Risk Factors:Hypertension and Former Smoker.  Sonographer:    Dondra Prader RVT RCS Referring Phys: [id number] OLADAPO ADEFESO IMPRESSIONS  1. Left ventricular ejection fraction, by estimation, is 25 to 30%. The left ventricle has severely decreased function. The left ventricle demonstrates global hypokinesis. There is mild left ventricular hypertrophy. Left ventricular diastolic parameters  are consistent with Grade III diastolic dysfunction (restrictive).  2. Right ventricular systolic function is mildly reduced. The right ventricular size is normal. Tricuspid regurgitation signal is inadequate for assessing PA pressure.  3. Left atrial size was severely dilated.  4. Right atrial size was mildly dilated.  5. The mitral valve is grossly normal. Mild mitral valve regurgitation. No evidence of mitral stenosis.  6. The aortic valve is tricuspid. Aortic valve regurgitation is trivial. No aortic stenosis is present.  7. The inferior vena cava is normal in size with <50% respiratory variability, suggesting right atrial pressure of 8 mmHg. Comparison(s): Changes from prior study are noted. The left ventricular function is worsened. FINDINGS  Left Ventricle: Left ventricular ejection fraction, by estimation, is 25 to 30%. The left ventricle has severely decreased function. The left ventricle demonstrates global hypokinesis. The left ventricular internal cavity size was normal in size.  There is mild left ventricular hypertrophy. Left ventricular diastolic parameters are consistent with Grade III diastolic dysfunction (restrictive). Right Ventricle: The right ventricular size is normal. No increase in right ventricular wall thickness. Right ventricular systolic function is mildly reduced. Tricuspid regurgitation signal is inadequate for assessing PA pressure. Left Atrium: Left atrial size was severely dilated. Right Atrium: Right atrial size was mildly dilated. Pericardium: Trivial pericardial effusion is present. Mitral Valve: The mitral valve is grossly normal. Mild mitral valve regurgitation. No evidence of mitral valve stenosis. Tricuspid Valve: The tricuspid valve is grossly normal. Tricuspid valve regurgitation is trivial. No evidence of tricuspid stenosis. Aortic Valve: The aortic valve is tricuspid. Aortic valve regurgitation is trivial. No aortic stenosis is present. Aortic valve mean gradient measures 2.0 mmHg. Aortic valve peak gradient measures 3.5 mmHg. Aortic valve area, by VTI measures 4.20 cm. Pulmonic Valve: The pulmonic valve was grossly normal. Pulmonic valve regurgitation is  trivial. No evidence of pulmonic stenosis. Aorta: The aortic root and ascending aorta are structurally normal, with no evidence of dilitation. Venous: The inferior vena cava is normal in size with less than 50% respiratory variability, suggesting right atrial pressure of 8 mmHg. IAS/Shunts: The atrial septum is grossly normal.  LEFT VENTRICLE PLAX 2D LVIDd:         5.20 cm   Diastology LVIDs:         3.80 cm   LV e' medial:    3.62 cm/s LV PW:         1.20 cm   LV E/e' medial:  30.9 LV IVS:        1.30 cm   LV e' lateral:   7.81 cm/s LVOT diam:     2.30 cm   LV E/e' lateral: 14.3 LV SV:         74 LV SV Index:   40 LVOT Area:     4.15 cm                           3D Volume EF:                          3D EF:        34 %                          LV EDV:       254 ml                          LV ESV:        167 ml                          LV SV:        88 ml RIGHT VENTRICLE RV Basal diam:  3.80 cm RV Mid diam:    3.20 cm RV S prime:     6.30 cm/s TAPSE (M-mode): 2.4 cm LEFT ATRIUM              Index        RIGHT ATRIUM           Index LA diam:        4.70 cm  2.54 cm/m   RA Area:     19.40 cm LA Vol (A2C):   133.0 ml 71.83 ml/m  RA Volume:   66.80 ml  36.08 ml/m LA Vol (A4C):   104.8 ml 56.60 ml/m LA Biplane Vol: 136.0 ml 73.45 ml/m  AORTIC VALVE                    PULMONIC VALVE AV Area (Vmax):    4.19 cm     PV Vmax:       0.79 m/s AV Area (Vmean):   3.68 cm     PV Peak grad:  2.5 mmHg AV Area (VTI):     4.20 cm AV Vmax:           93.10 cm/s AV Vmean:          64.400 cm/s AV VTI:            0.176 m AV Peak Grad:      3.5 mmHg AV Mean Grad:      2.0 mmHg LVOT Vmax:  93.80 cm/s LVOT Vmean:        57.000 cm/s LVOT VTI:          0.178 m LVOT/AV VTI ratio: 1.01  AORTA Ao Root diam: 3.50 cm Ao Asc diam:  3.50 cm MITRAL VALVE MV Area (PHT): 5.02 cm     SHUNTS MV Decel Time: 151 msec     Systemic VTI:  0.18 m MV E velocity: 112.00 cm/s  Systemic Diam: 2.30 cm MV A velocity: 41.90 cm/s MV E/A ratio:  2.67 Lennie Odor MD Electronically signed by Lennie Odor MD Signature Date/Time: 07/30/2022/4:32:49 PM    Final    CT Angio Chest PE W and/or Wo Contrast  Result Date: 07/28/2022 CLINICAL DATA:  Shortness of breath. EXAM: CT ANGIOGRAPHY CHEST WITH CONTRAST TECHNIQUE: Multidetector CT imaging of the chest was performed using the standard protocol during bolus administration of intravenous contrast. Multiplanar CT image reconstructions and MIPs were obtained to evaluate the vascular anatomy. RADIATION DOSE REDUCTION: This exam was performed according to the departmental dose-optimization program which includes automated exposure control, adjustment of the mA and/or kV according to patient size and/or use of iterative reconstruction technique. CONTRAST:  75mL OMNIPAQUE IOHEXOL 350 MG/ML SOLN COMPARISON:   April 25, 2022 FINDINGS: Cardiovascular: There is marked severity calcification of the thoracic aorta, without evidence of aortic aneurysm. Satisfactory opacification of the pulmonary arteries to the segmental level. No evidence of pulmonary embolism. There is mild cardiomegaly, with marked severity coronary artery calcification. No pericardial effusion. Mediastinum/Nodes: No enlarged mediastinal, hilar, or axillary lymph nodes. Thyroid gland, trachea, and esophagus demonstrate no significant findings. Lungs/Pleura: There is moderate to marked severity lingular atelectasis and/or infiltrate. Mild to moderate severity anteromedial right middle lobe and anterior right lower lobe scarring and/or atelectasis is also noted. These areas are new when compared to the prior study. The 4 mm posteromedial left lower lobe lung nodule seen on the prior study is not clearly identified on the current exam. A trace amount of pleural fluid is noted, bilaterally. No pneumothorax is identified. Upper Abdomen: Stable 7 mm and 14 mm foci of parenchymal low attenuation are seen within the anterior aspect of the right lobe of the liver. Multiple small parenchymal calcifications are seen within the visualized portion of body of the pancreas. Pancreatic ductal dilatation is also noted (approximately 8.2 cm in diameter). Musculoskeletal: Chronic right-sided rib fractures are seen. No acute osseous abnormalities are identified. Review of the MIP images confirms the above findings. IMPRESSION: 1. No evidence of pulmonary embolism. 2. Moderate to marked severity lingular atelectasis and/or infiltrate. 3. Mild to moderate severity anteromedial right middle lobe and anterior right lower lobe scarring and/or atelectasis. 4. Trace amount of pleural fluid, bilaterally. 5. Stable hepatic cysts. 6. Findings consistent with chronic pancreatitis. 7. Aortic atherosclerosis. Aortic Atherosclerosis (ICD10-I70.0). Electronically Signed   By: Aram Candela M.D.   On: 07/28/2022 22:27   DG Chest 2 View  Result Date: 07/28/2022 CLINICAL DATA:  SOB EXAM: CHEST - 2 VIEW COMPARISON:  Chest x-ray 07/24/2022 FINDINGS: Heart is prominent in size. The heart and mediastinal contours are unchanged. Aortic calcification. Right lower lung zone airspace opacity. No focal consolidation. No pulmonary edema. No pleural effusion. No pneumothorax. No acute osseous abnormality. IMPRESSION: 1. Right lower lung zone airspace opacity. Followup PA and lateral chest X-ray is recommended in 3-4 weeks following therapy to ensure resolution and exclude underlying malignancy. 2.  Aortic Atherosclerosis (ICD10-I70.0). Electronically Signed   By: Tish Frederickson M.D.   On:  07/28/2022 18:09   DG Chest Port 1 View  Result Date: 07/24/2022 CLINICAL DATA:  Shortness of breath. EXAM: PORTABLE CHEST 1 VIEW COMPARISON:  07/19/2022 FINDINGS: The cardio pericardial silhouette is enlarged. Interstitial markings are diffusely coarsened with chronic features. No overt airspace pulmonary edema or focal consolidation. Hazy opacity at the left base may be atelectasis and/or layering pleural fluid. Telemetry leads overlie the chest. IMPRESSION: 1. Chronic interstitial coarsening without overt airspace pulmonary edema. 2. Possible layering left pleural effusion. Electronically Signed   By: Kennith Center M.D.   On: 07/24/2022 11:59   DG Chest 2 View  Result Date: 07/21/2022 CLINICAL DATA:  COPD, shortness of breath EXAM: CHEST - 2 VIEW COMPARISON:  05/07/2022 FINDINGS: Transverse diameter of heart is increased. Central pulmonary vessels are more prominent. Patchy densities are noted in right lower lung field in the PA view which could not be localized in the lateral view. There is slight prominence of interstitial markings in the parahilar regions and lower lung fields. There is no pleural effusion or pneumothorax. IMPRESSION: Cardiomegaly. Central pulmonary vessels are more prominent suggesting  CHF. Small patchy densities are seen in the lateral aspect of right lower lung field seen only in the PA view. This may suggest atelectasis/pneumonia. There is no pleural effusion or pneumothorax. Electronically Signed   By: Ernie Avena M.D.   On: 07/21/2022 10:07   US Abdomen Limited RUQ (LIVER/GB)  Result Date: 07/21/2022 CLINICAL DATA:  Abnormal liver function tests EXAM: ULTRASOUND ABDOMEN LIMITED RIGHT UPPER QUADRANT COMPARISON:  CT done on 01/25/2022 FINDINGS: Gallbladder: There is large echogenic structure measuring 3.2 cm with acoustic shadowing in gallbladder fossa suggesting large gallbladder stone. Technologist did not observe any tenderness over the gallbladder fossa. Gallbladder is not distended. Common bile duct: Diameter: 4 mm Liver: There is increased echogenicity in liver. No focal abnormalities are seen in visualized portions of liver. Portal vein is patent on color Doppler imaging with normal direction of blood flow towards the liver. Other: None. IMPRESSION: Large gallbladder stone is noted filling the gallbladder. There are no imaging signs of acute cholecystitis. There is no dilation of bile ducts. There is increased echogenicity in liver suggesting possible fatty infiltration. Electronically Signed   By: Ernie Avena M.D.   On: 07/21/2022 10:03    Microbiology: Results for orders placed or performed during the hospital encounter of 07/28/22  Culture, blood (Routine X 2) w Reflex to ID Panel     Status: None   Collection Time: 07/29/22  8:55 AM   Specimen: Left Antecubital; Blood  Result Value Ref Range Status   Specimen Description   Final    LEFT ANTECUBITAL BOTTLES DRAWN AEROBIC AND ANAEROBIC   Special Requests Blood Culture adequate volume  Final   Culture   Final    NO GROWTH 5 DAYS Performed at [facility], [address] [place], [place] [address]    Report Status 08/03/2022 FINAL  Final  Culture, blood (Routine X 2) w Reflex to ID Panel     Status:  None   Collection Time: 07/29/22  8:55 AM   Specimen: BLOOD LEFT HAND  Result Value Ref Range Status   Specimen Description   Final    BLOOD LEFT HAND BOTTLES DRAWN AEROBIC AND ANAEROBIC   Special Requests Blood Culture adequate volume  Final   Culture   Final    NO GROWTH 5 DAYS Performed at [facility], [address], [place], [place] [address]    Report Status 08/03/2022  FINAL  Final    Labs: CBC: Recent Labs  Lab 08/15/22 1513 08/16/22 0408  WBC 7.0 3.0*  HGB 11.6* 11.6*  HCT 37.7* 37.7*  MCV 90.8 90.0  PLT 240 227   Basic Metabolic Panel: Recent Labs  Lab 08/15/22 1513 08/16/22 0408 08/17/22 0412  NA 140 141 139  K 3.7 3.4* 3.1*  CL 106 105 105  CO2 26 25 27   GLUCOSE 106* 189* 108*  BUN 24* 24* 27*  CREATININE 2.05* 1.93* 1.95*  CALCIUM 8.9 8.6* 8.1*  MG  --  2.1  --   PHOS  --  3.2  --    Liver Function Tests: Recent Labs  Lab 08/16/22 0408  AST 30  ALT 24  ALKPHOS 124  BILITOT 1.2  PROT 6.7  ALBUMIN 3.2*   CBG: No results for input(s): "GLUCAP" in the last 168 hours.  Discharge time spent: greater than 30 minutes.  Signed: Kendell Bane, MD [facility] 08/18/2022

## 2022-08-18 NOTE — TOC Transition Note (Signed)
Transition of Care [facility]) - CM/SW Discharge Note   Patient Details  Name: Michael [name] MRN: [id number] Date of Birth: [birth year]  Transition of Care [facility]) CM/SW Contact:  Catalina Gravel, LCSW Phone Number: 08/18/2022, 1:19 PM   Clinical Narrative:    Pt DC ready.  Landlord here to transport pt and asked to speak with TOC regarding pt. CSW and Supervisor met with pt and landlord. Both provided with an overview of the process to apply for special  assistance medicaid for ALF and also process outlined for LTC. No other TOC needs, pt DC.     Barriers to Discharge: No Barriers Identified   Patient Goals and CMS Choice      Discharge Placement                         Discharge Plan and Services Additional resources added to the After Visit Summary for                                       Social Determinants of Health (SDOH) Interventions SDOH Screenings   Food Insecurity: No Food Insecurity (08/17/2022)  Housing: Low Risk  (08/17/2022)  Transportation Needs: No Transportation Needs (08/17/2022)  Utilities: Not At Risk (08/17/2022)  Alcohol Screen: Low Risk  (12/11/2019)  Depression (PHQ2-9): Low Risk  (08/02/2022)  Financial Resource Strain: Low Risk  (12/11/2019)  Physical Activity: Inactive (12/11/2019)  Social Connections: Socially Isolated (12/11/2019)  Stress: No Stress Concern Present (12/11/2019)  Tobacco Use: Medium Risk (08/16/2022)     Readmission Risk Interventions    07/25/2022    1:09 PM 04/26/2022   11:46 AM 10/26/2021    2:57 PM  Readmission Risk Prevention Plan  Transportation Screening Complete Complete Complete  PCP or Specialist Appt within 3-5 Days   Complete  HRI or Home Care Consult  Complete Complete  Social Work Consult for Recovery Care Planning/Counseling  Complete Complete  Palliative Care Screening  Not Applicable Not Applicable  Medication Review Oceanographer) Complete Complete Complete  PCP or Specialist appointment  within 3-5 days of discharge Not Complete    HRI or Home Care Consult Complete    SW Recovery Care/Counseling Consult Complete    Palliative Care Screening Complete    Skilled Nursing Facility Not Applicable

## 2022-08-18 NOTE — Progress Notes (Signed)
PROGRESS NOTE  Michael [name], is a 67 y.o. male, DOB - Nov 21, 1955, [id number]  Admit date - 08/15/2022   Admitting Physician Frankey Shown, DO  Outpatient Primary MD for the patient is Benita Stabile, MD  LOS - 3  Chief Complaint  Patient presents with   Shortness of Breath      Brief Narrative:   67 y.o. male with medical history significant of HFrEF (LVEF on echocardiogram in June 2024 was 25 to 30%), COPD, CAD, CKD stage IIIb, hypertension, tobacco abuse admitted on 08/15/2022 with acute combined diastolic and systolic CHF exacerbation    Subjective: The patient was seen and examined this morning, stable no acute distress, complain of generalized weaknesses, improved lower extremity swelling, improved shortness of breath    Assessment and Plan:  1)HFrEF--acute exacerbation of combined diastolic and systolic dysfunction CHF --presumed nonischemic cardiomyopathy  Stable reporting improved shortness of breath on room air now  -repeat echo from 08/16/2022 with EF of 25% similar to prior  -Cardiology-following appreciate input: Recommending GDMT and Cont.:   Torsemide 20 mg twice daily PO Carvedilol 12.5 mg twice daily Losartan 25 mg once daily   -Dailyweights and fluid input and output management  Intake/Output Summary (Last 24 hours) at 08/18/2022 1140 Last data filed at 08/18/2022 0900 Gross per 24 hour  Intake 720 ml  Output --  Net 720 ml   Filed Weights   08/16/22 0500 08/17/22 0534 08/18/22 0357  Weight: 62.6 kg 61.9 kg 61.1 kg      2)H/o CAD--LHC from 10/20/2021 with mild to moderate CAD -Chest pain-free, -Continue medical management with isosorbide, carvedilol, atorvastatin and Plavix  3)-CKD stage -3B    -creatinine on admission= 2.05,  -baseline creatinine = 1.8-2.0     Lab Results  Component Value Date   CREATININE 1.95 (H) 08/17/2022   CREATININE 1.93 (H) 08/16/2022   CREATININE 2.05 (H) 08/15/2022     -- renally adjust medications, avoid  nephrotoxic agents / dehydration  / hypotension  4)Social/ethics--- patient lives with his neighbors/roommate -Palliative care consult appreciated patient is a DNR/DNI  5)COPD--no acute exacerbation,  -No further steroids continue bronchodilators -No hypoxia  6)Hypokalemia--due to diuretics, magnesium WNL -Replace  Status is: Inpatient   Disposition: The patient is from: Home              Anticipated d/c is to: Difficult to place-patient is homeless, consulted TOC for placement               Anticipated d/c date is: 2 days              Patient currently is not medically stable to d/c.     Code Status :  -  Code Status: DNR   Family Communication:    NA (patient is alert, awake and coherent)   DVT Prophylaxis  :   - SCDs  enoxaparin (LOVENOX) injection 40 mg Start: 08/15/22 2200 SCDs Start: 08/15/22 2008   Lab Results  Component Value Date   PLT 227 08/16/2022    Inpatient Medications Scheduled Meds:  atorvastatin  40 mg Oral QPM   carvedilol  12.5 mg Oral BID WC   clopidogrel  75 mg Oral Daily   enoxaparin (LOVENOX) injection  40 mg Subcutaneous Q24H   fluticasone furoate-vilanterol  1 puff Inhalation Daily   And   umeclidinium bromide  1 puff Inhalation Daily   losartan  25 mg Oral Daily   potassium chloride  40 mEq Oral BID   torsemide  20 mg Oral BID   Continuous Infusions: PRN Meds:.acetaminophen **OR** acetaminophen, albuterol, prochlorperazine   Objective: Vitals:   08/18/22 0349 08/18/22 0357 08/18/22 0812 08/18/22 0847  BP: 133/79   123/62  Pulse: 63   69  Resp: 16     Temp: 97.9 F (36.6 C)     TempSrc: Oral     SpO2: 98%  100% 98%  Weight:  61.1 kg    Height:        Intake/Output Summary (Last 24 hours) at 08/18/2022 1140 Last data filed at 08/18/2022 0900 Gross per 24 hour  Intake 720 ml  Output --  Net 720 ml   Filed Weights   08/16/22 0500 08/17/22 0534 08/18/22 0357  Weight: 62.6 kg 61.9 kg 61.1 kg        General:  AAO x  3,  cooperative, no distress;   HEENT:  Deform nasal  bridge -otherwise- normocephalic, PERRL, otherwise with in Normal limits   Neuro:  CNII-XII intact. , normal motor and sensation, reflexes intact   Lungs:   Clear to auscultation BL, Respirations unlabored,  No wheezes / crackles  Cardio:    S1/S2, RRR, No murmure, No Rubs or Gallops   Abdomen:  Soft, non-tender, bowel sounds active all four quadrants, no guarding or peritoneal signs.  Muscular  skeletal:  Limited exam -global generalized weaknesses - in bed, able to move all 4 extremities,   2+ pulses,  symmetric, +1 pitting edema  Skin:  Dry, warm to touch, negative for any Rashes,  Wounds: Please see nursing documentation           Data Reviewed: I have personally reviewed following labs and imaging studies  CBC: Recent Labs  Lab 08/15/22 1513 08/16/22 0408  WBC 7.0 3.0*  HGB 11.6* 11.6*  HCT 37.7* 37.7*  MCV 90.8 90.0  PLT 240 227   Basic Metabolic Panel: Recent Labs  Lab 08/15/22 1513 08/16/22 0408 08/17/22 0412  NA 140 141 139  K 3.7 3.4* 3.1*  CL 106 105 105  CO2 26 25 27   GLUCOSE 106* 189* 108*  BUN 24* 24* 27*  CREATININE 2.05* 1.93* 1.95*  CALCIUM 8.9 8.6* 8.1*  MG  --  2.1  --   PHOS  --  3.2  --    GFR: Estimated Creatinine Clearance: 31.3 mL/min (A) (by C-G formula based on SCr of 1.95 mg/dL (H)). Liver Function Tests: Recent Labs  Lab 08/16/22 0408  AST 30  ALT 24  ALKPHOS 124  BILITOT 1.2  PROT 6.7  ALBUMIN 3.2*   Radiology Studies: No results found.   Scheduled Meds:  atorvastatin  40 mg Oral QPM   carvedilol  12.5 mg Oral BID WC   clopidogrel  75 mg Oral Daily   enoxaparin (LOVENOX) injection  40 mg Subcutaneous Q24H   fluticasone furoate-vilanterol  1 puff Inhalation Daily   And   umeclidinium bromide  1 puff Inhalation Daily   losartan  25 mg Oral Daily   potassium chloride  40 mEq Oral BID   torsemide  20 mg Oral BID   Continuous Infusions:   LOS: 3 days     Kendell Bane M.D on 08/18/2022 at 11:40 AM 35 minutes was spent in seeing evaluating and examining the patient, reviewing labs, reviewing medication, drawing plan of care discussing disposition with transition of care  Go to [email or web address] - for contact info  [facility] - Office  [phone]  If 7PM-7AM, please contact night-coverage [email or web address] 08/18/2022,  11:40 AM

## 2022-08-18 NOTE — Progress Notes (Signed)
Palliative:    [name] is sitting on the edge of the bed.  He appears chronically ill and somewhat frail.  He greets me, making and somewhat keeping eye contact.  He is alert and oriented, able to make his needs known.  Bedside nursing staff is present attending to needs.  We talk about his discharge.  He shares his concern about where we will go upon discharge.  He shares that at this point he feels that he has no home.  He shares he will reach out to his landlord, Michael [name], to see if they will accept him back home.  We again today talk about "treat the treatable" hospice care at home.  He continues to decline stating that at this point he "has no home".  Anticipate multiple rehospitalization's.  Conference with bedside nursing staff and transition of care team related to patient condition, needs, goals of care, disposition.  Plan:   Continue to treat the treatable but no CPR or intubation.  Home with no needs identified.  Anticipate multiple rehospitalization's.  At this point continues to decline "treat the treatable" hospice care.  50 minutes   Michael Carmel, NP Palliative medicine team Team phone [phone] Greater than 50% of this time was spent counseling and coordinating care related to the above assessment and plan.

## 2022-08-19 ENCOUNTER — Ambulatory Visit: Payer: Self-pay | Admitting: *Deleted

## 2022-08-19 ENCOUNTER — Encounter: Payer: Self-pay | Admitting: *Deleted

## 2022-08-19 NOTE — Patient Outreach (Signed)
Care Coordination   Initial Visit Note   08/19/2022  Name: Michael [name] MRN: [id number] DOB: [birth year]  Michael [name] is a 67 y.o. year old male who sees [name], [name], MD for primary care. I spoke with [name] by phone today.  What matters to the patients health and wellness today?  Receive Assistance Completing Medicaid Application & Pursuing Higher Level of Care Options.   Goals Addressed               This Visit's Progress     Receive Assistance Completing Medicaid Application & Pursuing Higher Level of Care Options. (pt-stated)   On track     Care Coordination Interventions:  Interventions Today    Flowsheet Row Most Recent Value  Chronic Disease   Chronic disease during today's visit Chronic Obstructive Pulmonary Disease (COPD), Congestive Heart Failure (CHF), Chronic Kidney Disease/End Stage Renal Disease (ESRD), Hypertension (HTN), Other  [Inability to Perform Activities of Daily Living Independently]  General Interventions   General Interventions Discussed/Reviewed General Interventions Discussed, Labs, Vaccines, Doctor Visits, Communication with, Level of Care, [employer], Horticulturist, commercial (DME), Lipid Profile, Annual Foot Exam, Health Screening, General Interventions Reviewed, Annual Eye Exam  [Encouraged]  Labs Hgb A1c every 3 months, Kidney Function  [Encouraged]  Vaccines Tetanus/Pertussis/Diphtheria, Shingles, RSV, Pneumonia, Flu, COVID-19  [Encouraged]  Doctor Visits Discussed/Reviewed Doctor Visits Discussed, Specialist, Doctor Visits Reviewed, Annual Wellness Visits, PCP  [Encouraged]  Health Screening Bone Density, Colonoscopy, Prostate  [Encouraged]  Durable Medical Equipment (DME) Wheelchair  [Encouraged]  Wheelchair Standard  [Encouraged]  PCP/Specialist Visits Compliance with follow-up visit  [Encouraged]  Communication with PCP/Specialists, RN  [Encouraged]  Level of Care Adult Daycare, Personal Care Services, Applications,  Assisted Living, Skilled Nursing Facility  [Encouraged]  Applications Medicaid, Personal Care Services, [place]-2  [Encouraged]  Exercise Interventions   Exercise Discussed/Reviewed Assistive device use and maintanence, Exercise Discussed, Exercise Reviewed, Physical Activity, Weight Managment  [Encouraged]  Physical Activity Discussed/Reviewed Home Exercise Program (HEP), Physical Activity Discussed, Physical Activity Reviewed, Types of exercise  [Encouraged]  Weight Management Weight maintenance  [Encouraged]  Education Interventions   Education Provided Provided Therapist, sports, Provided Web-based Education, Provided Education  [Encouraged]  Provided Verbal Education On Blood Sugar Monitoring, Medication, Nutrition, Mental Health/Coping with Illness, When to see the doctor, Sick Day Rules, Foot Care, Eye Care, [employer], Air traffic controller, Exercise, [employer], Insurance Plans  [Encouraged]  Labs Reviewed Hgb A1c  [Encouraged]  Applications Medicaid, Personal Care Services, [place]-2  [Encouraged]  Mental Health Interventions   Mental Health Discussed/Reviewed Mental Health Discussed, Anxiety, Depression, Grief and Loss, Substance Abuse, Mental Health Reviewed, Coping Strategies, Crisis, Other, Suicide  [Domestic Violence]  Nutrition Interventions   Nutrition Discussed/Reviewed Nutrition Discussed, Adding fruits and vegetables, Increasing proteins, Decreasing fats, Decreasing salt, Supplemental nutrition, Decreasing sugar intake, Portion sizes, Carbohydrate meal planning, Fluid intake, Nutrition Reviewed  [Encouraged]  Pharmacy Interventions   Pharmacy Dicussed/Reviewed Pharmacy Topics Discussed, Medications and their functions, Medication Adherence, Pharmacy Topics Reviewed, Affording Medications  [Encouraged]  Safety Interventions   Safety Discussed/Reviewed Safety Discussed, Safety Reviewed, Fall Risk, Home Safety  [Encouraged]  Home Safety Assistive Devices, Need for home safety assessment, Refer  for home visit, Refer for community resources, Contact provider for referral to PT/OT, Contact home health agency  [Encouraged]  Advanced Directive Interventions   Advanced Directives Discussed/Reviewed Advanced Directives Discussed  [Encouraged]  End of Life Palliative  [Encouraged]      Assessed Social Determinant of Health Barriers. Discussed Plans for Ongoing Care Management  Follow Up. Provided Careers information officer Information for Care Management Team Members. Screened for Signs & Symptoms of Depression, Related to Chronic Disease State.  PHQ2 & PHQ9 Depression Screen Completed & Results Reviewed.  Suicidal Ideation & Homicidal Ideation Assessed - None Present.   Domestic Violence Assessed - None Present. Access to Weapons Assessed - None Present.   Active Listening & Reflection Utilized.  Verbalization of Feelings Encouraged.  Emotional Support Provided. Feelings of Caregiver Burnout Validated. Caregiver Stress Acknowledged. Caregiver Resources Reviewed. Caregiver Support Groups Mailed. Self-Enrollment in Caregiver Support Group of Interest Emphasized. Crisis Support Information, Agencies, Services & Resources Discussed. Problem Solving Interventions Identified. Task-Centered Solutions Implemented.   Solution-Focused Strategies Developed. Acceptance & Commitment Therapy Introduced. Brief Cognitive Behavioral Therapy Initiated. Client-Centered Therapy Enacted. Reviewed Prescription Medications & Discussed Importance of Compliance. Quality of Sleep Assessed & Sleep Hygiene Techniques Promoted. Discussed Higher Level of Care Options (I.e. Assisted Living, Extended Care, Skilled Nursing, Etc.) & Encouraged Consideration. Reviewed Materials engineer through [employer] Dual Complete & [employer] & Encouraged Consideration of Applying for [employer], through [employer] [phone]). Verified No Long-Term Care Insurance  Benefits, Secondary Insurance Policies, Plans, Coverage, Etc.  Encouraged Review of The Following List of [employer], [employer], Resources, Occupational psychologist, Mailed on 08/19/2022: ~ Adult Day Care Programs  ~ In-Home Care & Respite Agencies ~ Home Health Care Agencies ~ Respite Care Agencies & Facilities ~ Personal Care Services Application  ~ Theatre manager Providers Encouraged Careers information officer with [employer], Services, & Resources of Interest, in An Effort to Obtain Care & Supervision in The Home & Community. Collaboration with Kristin Bruins, Medicaid Case Worker with [facility] Department of Social Services [phone]), to Confirm Active Adult Medicaid Status. Encouraged Collaboration with Medicaid Case Worker, Kristin Bruins with [facility] Department of Social Services [phone]), to Change Medicaid Status to Special Assistance Long-Term Care Medicaid Coverage, if Interested in Pursuing Long-Term Care Placement Options. Encouraged Completion of Application for Personal Care Services & Submission to [facility] [phone]) for Processing, if Wanting to Remain in the Home to Live Independently & Receive Caregiver Services. Confirmed Interest & Agreement in Referral for Palliative Care Services, through [facility] [phone]), Former[facility] Known [facility]. CSW Collaboration with Receptionist at [facility] [phone]), to Confirm Receipt of Referral for Palliative Care Services.  Encouraged Careers information officer with CSW (# [phone]), if You Have Questions, Need Assistance, or If Additional Social Work Needs Are Identified Between Now & Our Next Scheduled Follow-Up Outreach Call.        SDOH assessments and interventions completed:  Yes.  SDOH Interventions Today    Flowsheet Row Most Recent Value  SDOH Interventions   Food Insecurity Interventions Intervention Not  Indicated  Housing Interventions Intervention Not Indicated  Transportation Interventions Intervention Not Indicated, Patient Resources (Friends/Family), Payor Benefit  Utilities Interventions Intervention Not Indicated  Alcohol Usage Interventions Intervention Not Indicated (Score <7)  Financial Strain Interventions Intervention Not Indicated  Physical Activity Interventions Patient Declined  Stress Interventions Intervention Not Indicated  Social Connections Interventions Intervention Not Indicated  Health Literacy Interventions Intervention Not Indicated     Care Coordination Interventions:  Yes, provided.   Follow up plan: Follow up call scheduled for 08/26/2022 at 2:00 pm.  Encounter Outcome:  Pt. Visit Completed.   Danford Bad, BSW, MSW, LCSW  Licensed Restaurant manager, [employer] [facility]  Mailing Address-[address] [place], [place] [address] Physical Address-[address], [place], [place] [address] Toll Free Main # [phone] Fax # [phone] Cell # [phone] [email or web address]

## 2022-08-19 NOTE — Patient Instructions (Signed)
Visit Information  Thank you for taking time to visit with me today. Please don't hesitate to contact me if I can be of assistance to you.   Following are the goals we discussed today:   Goals Addressed               This Visit's Progress     Receive Assistance Completing Medicaid Application & Pursuing Higher Level of Care Options. (pt-stated)   On track     Care Coordination Interventions:  Interventions Today    Flowsheet Row Most Recent Value  Chronic Disease   Chronic disease during today's visit Chronic Obstructive Pulmonary Disease (COPD), Congestive Heart Failure (CHF), Chronic Kidney Disease/End Stage Renal Disease (ESRD), Hypertension (HTN), Other  [Inability to Perform Activities of Daily Living Independently]  General Interventions   General Interventions Discussed/Reviewed General Interventions Discussed, Labs, Vaccines, Doctor Visits, Communication with, Level of Care, [employer], Horticulturist, commercial (DME), Lipid Profile, Annual Foot Exam, Health Screening, General Interventions Reviewed, Annual Eye Exam  [Encouraged]  Labs Hgb A1c every 3 months, Kidney Function  [Encouraged]  Vaccines Tetanus/Pertussis/Diphtheria, Shingles, RSV, Pneumonia, Flu, COVID-19  [Encouraged]  Doctor Visits Discussed/Reviewed Doctor Visits Discussed, Specialist, Doctor Visits Reviewed, Annual Wellness Visits, PCP  [Encouraged]  Health Screening Bone Density, Colonoscopy, Prostate  [Encouraged]  Durable Medical Equipment (DME) Wheelchair  [Encouraged]  Wheelchair Standard  [Encouraged]  PCP/Specialist Visits Compliance with follow-up visit  [Encouraged]  Communication with PCP/Specialists, RN  [Encouraged]  Level of Care Adult Daycare, Personal Care Services, Applications, Assisted Living, Skilled Nursing Facility  [Encouraged]  Applications Medicaid, Personal Care Services, [place]  [Encouraged]  Exercise Interventions   Exercise Discussed/Reviewed Assistive device use and  maintanence, Exercise Discussed, Exercise Reviewed, Physical Activity, Weight Managment  [Encouraged]  Physical Activity Discussed/Reviewed Home Exercise Program (HEP), Physical Activity Discussed, Physical Activity Reviewed, Types of exercise  [Encouraged]  Weight Management Weight maintenance  [Encouraged]  Education Interventions   Education Provided Provided Therapist, sports, Provided Web-based Education, Provided Education  [Encouraged]  Provided Verbal Education On Blood Sugar Monitoring, Medication, Nutrition, Mental Health/Coping with Illness, When to see the doctor, Sick Day Rules, Foot Care, Eye Care, [employer], Air traffic controller, Exercise, [employer], Insurance Plans  [Encouraged]  Labs Reviewed Hgb A1c  [Encouraged]  Applications Medicaid, Personal Care Services, [place]-2  [Encouraged]  Mental Health Interventions   Mental Health Discussed/Reviewed Mental Health Discussed, Anxiety, Depression, Grief and Loss, Substance Abuse, Mental Health Reviewed, Coping Strategies, Crisis, Other, Suicide  [Domestic Violence]  Nutrition Interventions   Nutrition Discussed/Reviewed Nutrition Discussed, Adding fruits and vegetables, Increasing proteins, Decreasing fats, Decreasing salt, Supplemental nutrition, Decreasing sugar intake, Portion sizes, Carbohydrate meal planning, Fluid intake, Nutrition Reviewed  [Encouraged]  Pharmacy Interventions   Pharmacy Dicussed/Reviewed Pharmacy Topics Discussed, Medications and their functions, Medication Adherence, Pharmacy Topics Reviewed, Affording Medications  [Encouraged]  Safety Interventions   Safety Discussed/Reviewed Safety Discussed, Safety Reviewed, Fall Risk, Home Safety  [Encouraged]  Home Safety Assistive Devices, Need for home safety assessment, Refer for home visit, Refer for community resources, Contact provider for referral to PT/OT, Contact home health agency  [Encouraged]  Advanced Directive Interventions   Advanced Directives Discussed/Reviewed  Advanced Directives Discussed  [Encouraged]  End of Life Palliative  [Encouraged]      Assessed Social Determinant of Health Barriers. Discussed Plans for Ongoing Care Management Follow Up. Provided Careers information officer Information for Care Management Team Members. Screened for Signs & Symptoms of Depression, Related to Chronic Disease State.  PHQ2 & PHQ9 Depression Screen Completed & Results  Reviewed.  Suicidal Ideation & Homicidal Ideation Assessed - None Present.   Domestic Violence Assessed - None Present. Access to Weapons Assessed - None Present.   Active Listening & Reflection Utilized.  Verbalization of Feelings Encouraged.  Emotional Support Provided. Feelings of Caregiver Burnout Validated. Caregiver Stress Acknowledged. Caregiver Resources Reviewed. Caregiver Support Groups Mailed. Self-Enrollment in Caregiver Support Group of Interest Emphasized. Crisis Support Information, Agencies, Services & Resources Discussed. Problem Solving Interventions Identified. Task-Centered Solutions Implemented.   Solution-Focused Strategies Developed. Acceptance & Commitment Therapy Introduced. Brief Cognitive Behavioral Therapy Initiated. Client-Centered Therapy Enacted. Reviewed Prescription Medications & Discussed Importance of Compliance. Quality of Sleep Assessed & Sleep Hygiene Techniques Promoted. Discussed Higher Level of Care Options (I.e. Assisted Living, Extended Care, Skilled Nursing, Etc.) & Encouraged Consideration. Reviewed Materials engineer through [employer] & [employer] & Encouraged Consideration of Applying for [employer], through [employer] [phone]). Verified No Long-Term Care Insurance Benefits, Secondary Insurance Policies, Plans, Coverage, Etc.  Encouraged Review of The Following List of [employer], [employer], Resources, Occupational psychologist, Mailed on 08/19/2022: ~ Adult Day Care Programs   ~ In-Home Care & Respite Agencies ~ Home Health Care Agencies ~ Respite Care Agencies & Facilities ~ Personal Care Services Application  ~ Theatre manager Providers Encouraged Careers information officer with [employer], Services, & Resources of Interest, in An Effort to Obtain Care & Supervision in The Home & Community. Collaboration with Kristin Bruins, Medicaid Case Worker with [facility] Department of Social Services [phone]), to Confirm Active Adult Medicaid Status. Encouraged Collaboration with Medicaid Case Worker, Kristin Bruins with [facility] Department of Social Services [phone]), to Change Medicaid Status to Special Assistance Long-Term Care Medicaid Coverage, if Interested in Pursuing Long-Term Care Placement Options. Encouraged Completion of Application for Personal Care Services & Submission to [facility] [phone]) for Processing, if Wanting to Remain in the Home to Live Independently & Receive Caregiver Services. Confirmed Interest & Agreement in Referral for Palliative Care Services, through [facility] [phone]), [facility] Known [facility]. CSW Collaboration with Receptionist at [facility] [phone]), to Confirm Receipt of Referral for Palliative Care Services.  Encouraged Careers information officer with CSW (# [phone]), if You Have Questions, Need Assistance, or If Additional Social Work Needs Are Identified Between Now & Our Next Scheduled Follow-Up Outreach Call.      Our next appointment is by telephone on 08/26/2022 at 2:00 pm.  Please call the care guide team at [phone] if you need to cancel or reschedule your appointment.   If you are experiencing a Mental Health or Behavioral Health Crisis or need someone to talk to, please call the Suicide and Crisis Lifeline: 988 call the [place] National Suicide Prevention Lifeline: 669-772-5469 or TTY: 430-084-5287 TTY  (301) 339-4013) to talk to a trained counselor call 1-800-273-TALK (toll free, 24 hour hotline) go to [facility] [address], [place] [phone]) call the [facility] Crisis Line: [phone] call 911  Patient verbalizes understanding of instructions and care plan provided today and agrees to view in MyChart. Active MyChart status and patient understanding of how to access instructions and care plan via MyChart confirmed with patient.     Telephone follow up appointment with care management team member scheduled for:  08/26/2022 at 2:00 pm.  Danford Bad, BSW, MSW, LCSW  Licensed Clinical Social Worker  [facility]  Mailing Address-[address], [place], [place] [address] Physical Address-[address] [place], [place] [address] Toll Free Main # [phone] Fax # [phone] Cell # [phone] [email or web address]

## 2022-08-22 ENCOUNTER — Encounter: Payer: Self-pay | Admitting: *Deleted

## 2022-08-23 ENCOUNTER — Inpatient Hospital Stay: Payer: 59

## 2022-08-24 ENCOUNTER — Inpatient Hospital Stay: Payer: 59

## 2022-08-24 VITALS — BP 138/69 | HR 55 | Temp 97.8°F | Resp 18

## 2022-08-24 DIAGNOSIS — Z79899 Other long term (current) drug therapy: Secondary | ICD-10-CM | POA: Diagnosis not present

## 2022-08-24 DIAGNOSIS — Z923 Personal history of irradiation: Secondary | ICD-10-CM | POA: Diagnosis not present

## 2022-08-24 DIAGNOSIS — N1832 Chronic kidney disease, stage 3b: Secondary | ICD-10-CM | POA: Diagnosis not present

## 2022-08-24 DIAGNOSIS — D500 Iron deficiency anemia secondary to blood loss (chronic): Secondary | ICD-10-CM

## 2022-08-24 DIAGNOSIS — E559 Vitamin D deficiency, unspecified: Secondary | ICD-10-CM | POA: Diagnosis not present

## 2022-08-24 DIAGNOSIS — I130 Hypertensive heart and chronic kidney disease with heart failure and stage 1 through stage 4 chronic kidney disease, or unspecified chronic kidney disease: Secondary | ICD-10-CM | POA: Diagnosis not present

## 2022-08-24 DIAGNOSIS — D509 Iron deficiency anemia, unspecified: Secondary | ICD-10-CM | POA: Diagnosis not present

## 2022-08-24 DIAGNOSIS — Z7902 Long term (current) use of antithrombotics/antiplatelets: Secondary | ICD-10-CM | POA: Diagnosis not present

## 2022-08-24 DIAGNOSIS — Z85828 Personal history of other malignant neoplasm of skin: Secondary | ICD-10-CM | POA: Diagnosis not present

## 2022-08-24 DIAGNOSIS — D631 Anemia in chronic kidney disease: Secondary | ICD-10-CM | POA: Diagnosis not present

## 2022-08-24 DIAGNOSIS — Z87891 Personal history of nicotine dependence: Secondary | ICD-10-CM | POA: Diagnosis not present

## 2022-08-24 MED ORDER — CETIRIZINE HCL 10 MG PO TABS
10.0000 mg | ORAL_TABLET | Freq: Once | ORAL | Status: AC
  Administered 2022-08-24: 10 mg via ORAL
  Filled 2022-08-24: qty 1

## 2022-08-24 MED ORDER — SODIUM CHLORIDE 0.9 % IV SOLN: Freq: Once | INTRAVENOUS | Status: AC

## 2022-08-24 MED ORDER — SODIUM CHLORIDE 0.9 % IV SOLN
300.0000 mg | Freq: Once | INTRAVENOUS | Status: AC
  Administered 2022-08-24: 300 mg via INTRAVENOUS
  Filled 2022-08-24: qty 15

## 2022-08-24 MED ORDER — ACETAMINOPHEN 325 MG PO TABS
650.0000 mg | ORAL_TABLET | Freq: Once | ORAL | Status: AC
  Administered 2022-08-24: 650 mg via ORAL
  Filled 2022-08-24: qty 2

## 2022-08-24 NOTE — Progress Notes (Signed)
Patient tolerated iron infusion with no complaints voiced.  Peripheral IV site clean and dry with good blood return noted before and after infusion.  Band aid applied.  VSS with discharge and left in satisfactory condition with no s/s of distress noted.   

## 2022-08-24 NOTE — Patient Instructions (Signed)

## 2022-08-26 ENCOUNTER — Ambulatory Visit: Payer: Self-pay | Admitting: *Deleted

## 2022-08-26 ENCOUNTER — Encounter: Payer: Self-pay | Admitting: *Deleted

## 2022-08-26 NOTE — Patient Outreach (Signed)
Care Coordination   Follow Up Visit Note   08/26/2022  Name: Michael [name] MRN: [id number] DOB: [birth year]  Michael [name] is a 67 y.o. year old male who sees Michael [name], Michael Hazel, MD for primary care. I spoke with Michael [name] and friend/caregiver, Michael [name] by phone today.  What matters to the patients health and wellness today?  Receive Assistance Completing Medicaid Application & Pursuing Higher Level of Care Options.   Goals Addressed               This Visit's Progress     Receive Assistance Completing Medicaid Application & Pursuing Higher Level of Care Options. (pt-stated)   On track     Care Coordination Interventions:  Interventions Today    Flowsheet Row Most Recent Value  Chronic Disease   Chronic disease during today's visit Chronic Obstructive Pulmonary Disease (COPD), Congestive Heart Failure (CHF), Chronic Kidney Disease/End Stage Renal Disease (ESRD), Hypertension (HTN), Other  [Inability to Perform Activities of Daily Living Independently]  General Interventions   General Interventions Discussed/Reviewed General Interventions Discussed, Labs, Vaccines, Doctor Visits, Communication with, Level of Care, [employer], Horticulturist, commercial (DME), Lipid Profile, Annual Foot Exam, Health Screening, General Interventions Reviewed, Annual Eye Exam  [Encouraged]  Labs Hgb A1c every 3 months, Kidney Function  [Encouraged]  Vaccines Tetanus/Pertussis/Diphtheria, Shingles, RSV, Pneumonia, Flu, COVID-19  [Encouraged]  Doctor Visits Discussed/Reviewed Doctor Visits Discussed, Specialist, Doctor Visits Reviewed, Annual Wellness Visits, PCP  [Encouraged]  Health Screening Bone Density, Colonoscopy, Prostate  [Encouraged]  Durable Medical Equipment (DME) Wheelchair  [Encouraged]  Wheelchair Standard  [Encouraged]  PCP/Specialist Visits Compliance with follow-up visit  [Encouraged]  Communication with PCP/Specialists, RN  [Encouraged]  Level of Care Adult Daycare,  Personal Care Services, Applications, Assisted Living, Skilled Nursing Facility  [Encouraged]  Applications Medicaid, Personal Care Services, [place]-2  [Encouraged]  Exercise Interventions   Exercise Discussed/Reviewed Assistive device use and maintanence, Exercise Discussed, Exercise Reviewed, Physical Activity, Weight Managment  [Encouraged]  Physical Activity Discussed/Reviewed Home Exercise Program (HEP), Physical Activity Discussed, Physical Activity Reviewed, Types of exercise  [Encouraged]  Weight Management Weight maintenance  [Encouraged]  Education Interventions   Education Provided Provided Therapist, sports, Provided Web-based Education, Provided Education  [Encouraged]  Provided Verbal Education On Blood Sugar Monitoring, Medication, Nutrition, Mental Health/Coping with Illness, When to see the doctor, Sick Day Rules, Foot Care, Eye Care, [employer], Air traffic controller, Exercise, [employer], Insurance Plans  [Encouraged]  Labs Reviewed Hgb A1c  [Encouraged]  Applications Medicaid, Personal Care Services, [place]-2  [Encouraged]  Mental Health Interventions   Mental Health Discussed/Reviewed Mental Health Discussed, Anxiety, Depression, Grief and Loss, Substance Abuse, Mental Health Reviewed, Coping Strategies, Crisis, Other, Suicide  [Domestic Violence]  Nutrition Interventions   Nutrition Discussed/Reviewed Nutrition Discussed, Adding fruits and vegetables, Increasing proteins, Decreasing fats, Decreasing salt, Supplemental nutrition, Decreasing sugar intake, Portion sizes, Carbohydrate meal planning, Fluid intake, Nutrition Reviewed  [Encouraged]  Pharmacy Interventions   Pharmacy Dicussed/Reviewed Pharmacy Topics Discussed, Medications and their functions, Medication Adherence, Pharmacy Topics Reviewed, Affording Medications  [Encouraged]  Safety Interventions   Safety Discussed/Reviewed Safety Discussed, Safety Reviewed, Fall Risk, Home Safety  [Encouraged]  Home Safety Assistive Devices,  Need for home safety assessment, Refer for home visit, Refer for community resources, Contact provider for referral to PT/OT, Contact home health agency  [Encouraged]  Advanced Directive Interventions   Advanced Directives Discussed/Reviewed Advanced Directives Discussed  [Encouraged]  End of Life Palliative  [Encouraged]      Active Listening & Reflection Utilized.  Verbalization  of Feelings Encouraged.  Emotional Support Provided. Feelings of Caregiver Burnout Validated. Caregiver Stress Acknowledged. Caregiver Resources Reviewed. Caregiver Support Groups Mailed. Self-Enrollment in Caregiver Support Group of Interest Emphasized. Crisis Support Information, Agencies, Services & Resources Revisited. Problem Solving Interventions Activated. Task-Centered Solutions Implemented.   Solution-Focused Strategies Employed. Acceptance & Commitment Therapy Performed. Brief Cognitive Behavioral Therapy Initiated. Client-Centered Therapy Enacted. Confirmed Active Involvement with Palliative Care Services, through [facility] [phone]), [facility] Known [facility]. Encouraged Administration of Medications, Exactly as Prescribed. Encouraged Increased Level of Activity & Exercise, as Tolerated. Encouraged Implementation of Deep Breathing Exercises, Relaxation Techniques, & Mindfulness Meditation Strategies Daily. Encouraged Consideration of Higher Level of Care Placement (I.e. Assisted Living, Extended Care, Skilled Nursing, Etc.). Confirmed Receipt & Thoroughly Reviewed the Following List of [employer], Nurse, adult, Resources, Occupational psychologist, to [employer] & Entertain Questions: ~ Adult Day Care Programs  ~ Home Care - Aging, Disability, & Transit Services of [place] ~ Family Caregivers ~ In-Home Care & Respite Agencies ~ Home Health Care Agencies ~ Respite Care Agencies & Facilities ~ Personal Care Services Application  ~ Landscape architect Agency Providers ~ 2023 Medicaid Tips ~ Medicaid Application ~ Apply for [employer] ~ Making Medicaid Accessible through [facility] Encouraged Solicitor with [employer], Nurse, adult, & Resources of Interest in [place], from List Provided, in An Effort to [employer] & Supervision in The Home [employer]. Encouraged Collaboration with Medicaid Case Worker, Kristin Bruins with [facility] Department of Social Services [phone]), to Change Medicaid Status to Special Assistance Long-Term Care Medicaid Coverage, if Interested in Pursuing Long-Term Care Placement Options. Encouraged Completion of Personal Care Services Application & Submission to [facility] [phone]) for Processing, if Decision is To Remain in The Home. Encouraged Contact with CSW (# [phone]), if You Have Questions, Need Assistance, or If Additional Social Work Needs Are Identified Between Now & Our Next Scheduled Follow-Up Outreach Call.      SDOH assessments and interventions completed:  Yes.  Care Coordination Interventions:  Yes, provided.   Follow up plan: Follow up call scheduled for 09/26/2022 at 9:45 am.  Encounter Outcome:  Pt. Visit Completed.   Danford Bad, BSW, MSW, LCSW  Licensed Restaurant manager, [facility]  Mailing [place] [address], [place], [place] [address] Physical Address-[address], [place], [place] [address] Toll Free Main # [phone] Fax # [phone] Cell # [phone] [email or web address]

## 2022-08-26 NOTE — Patient Instructions (Signed)
Visit Information  Thank you for taking time to visit with me today. Please don't hesitate to contact me if I can be of assistance to you.   Following are the goals we discussed today:   Goals Addressed               This Visit's Progress     Receive Assistance Completing Medicaid Application & Pursuing Higher Level of Care Options. (pt-stated)   On track     Care Coordination Interventions:  Interventions Today    Flowsheet Row Most Recent Value  Chronic Disease   Chronic disease during today's visit Chronic Obstructive Pulmonary Disease (COPD), Congestive Heart Failure (CHF), Chronic Kidney Disease/End Stage Renal Disease (ESRD), Hypertension (HTN), Other  [Inability to Perform Activities of Daily Living Independently]  General Interventions   General Interventions Discussed/Reviewed General Interventions Discussed, Labs, Vaccines, Doctor Visits, Communication with, Level of Care, [employer], Horticulturist, commercial (DME), Lipid Profile, Annual Foot Exam, Health Screening, General Interventions Reviewed, Annual Eye Exam  [Encouraged]  Labs Hgb A1c every 3 months, Kidney Function  [Encouraged]  Vaccines Tetanus/Pertussis/Diphtheria, Shingles, RSV, Pneumonia, Flu, COVID-19  [Encouraged]  Doctor Visits Discussed/Reviewed Doctor Visits Discussed, Specialist, Doctor Visits Reviewed, Annual Wellness Visits, PCP  [Encouraged]  Health Screening Bone Density, Colonoscopy, Prostate  [Encouraged]  Durable Medical Equipment (DME) Wheelchair  [Encouraged]  Wheelchair Standard  [Encouraged]  PCP/Specialist Visits Compliance with follow-up visit  [Encouraged]  Communication with PCP/Specialists, RN  [Encouraged]  Level of Care Adult Daycare, Personal Care Services, Applications, Assisted Living, Skilled Nursing Facility  [Encouraged]  Applications Medicaid, Personal Care Services, FL-2  [Encouraged]  Exercise Interventions   Exercise Discussed/Reviewed Assistive device use and  maintanence, Exercise Discussed, Exercise Reviewed, Physical Activity, Weight Managment  [Encouraged]  Physical Activity Discussed/Reviewed Home Exercise Program (HEP), Physical Activity Discussed, Physical Activity Reviewed, Types of exercise  [Encouraged]  Weight Management Weight maintenance  [Encouraged]  Education Interventions   Education Provided Provided Therapist, sports, Provided Web-based Education, Provided Education  [Encouraged]  Provided Verbal Education On Blood Sugar Monitoring, Medication, Nutrition, Mental Health/Coping with Illness, When to see the doctor, Sick Day Rules, Foot Care, Eye Care, Google, Air traffic controller, Exercise, [employer], Insurance Plans  [Encouraged]  Labs Reviewed Hgb A1c  [Encouraged]  Applications Medicaid, Personal Care Services, FL-2  [Encouraged]  Mental Health Interventions   Mental Health Discussed/Reviewed Mental Health Discussed, Anxiety, Depression, Grief and Loss, Substance Abuse, Mental Health Reviewed, Coping Strategies, Crisis, Other, Suicide  [Domestic Violence]  Nutrition Interventions   Nutrition Discussed/Reviewed Nutrition Discussed, Adding fruits and vegetables, Increasing proteins, Decreasing fats, Decreasing salt, Supplemental nutrition, Decreasing sugar intake, Portion sizes, Carbohydrate meal planning, Fluid intake, Nutrition Reviewed  [Encouraged]  Pharmacy Interventions   Pharmacy Dicussed/Reviewed Pharmacy Topics Discussed, Medications and their functions, Medication Adherence, Pharmacy Topics Reviewed, Affording Medications  [Encouraged]  Safety Interventions   Safety Discussed/Reviewed Safety Discussed, Safety Reviewed, Fall Risk, Home Safety  [Encouraged]  Home Safety Assistive Devices, Need for home safety assessment, Refer for home visit, Refer for community resources, Contact provider for referral to PT/OT, Contact home health agency  [Encouraged]  Advanced Directive Interventions   Advanced Directives Discussed/Reviewed  Advanced Directives Discussed  [Encouraged]  End of Life Palliative  [Encouraged]      Active Listening & Reflection Utilized.  Verbalization of Feelings Encouraged.  Emotional Support Provided. Feelings of Caregiver Burnout Validated. Caregiver Stress Acknowledged. Caregiver Resources Reviewed. Caregiver Support Groups Mailed. Self-Enrollment in Caregiver Support Group of Interest Emphasized. Crisis Support Information, Agencies, Wellsite geologist  Revisited. Problem Solving Interventions Activated. Task-Centered Solutions Implemented.   Solution-Focused Strategies Employed. Acceptance & Commitment Therapy Performed. Brief Cognitive Behavioral Therapy Initiated. Client-Centered Therapy Enacted. Confirmed Active Involvement with Palliative Care Services, through [facility] [phone]), [facility]. Encouraged Administration of Medications, Exactly as Prescribed. Encouraged Increased Level of Activity & Exercise, as Tolerated. Encouraged Implementation of Deep Breathing Exercises, Relaxation Techniques, & Mindfulness Meditation Strategies Daily. Encouraged Consideration of Higher Level of Care Placement (I.e. Assisted Living, Extended Care, Skilled Nursing, Etc.). Confirmed Receipt & Thoroughly Reviewed the Following List of [employer], Nurse, adult, Resources, Occupational psychologist, to [employer] & Entertain Questions: ~ Adult Day Care Programs  ~ Home Care - Aging, Disability, & Transit Services of [place] ~ Family Caregivers ~ In-Home Care & Respite Agencies ~ Home Health Care Agencies ~ Respite Care Agencies & Facilities ~ Personal Care Services Application  ~ Development worker, international aid Agency Providers ~ 2023 Medicaid Tips ~ Medicaid Application ~ Apply for [employer] ~ Making Medicaid Accessible through [facility] Encouraged Solicitor with [employer], Nurse, adult, & Resources of  Interest in [place], from List Provided, in An Effort to [employer] & Supervision in The Home [employer]. Encouraged Collaboration with Medicaid Case Worker, Kristin Bruins with [facility] Department of Social Services [phone]), to Change Medicaid Status to Special Assistance Long-Term Care Medicaid Coverage, if Interested in Pursuing Long-Term Care Placement Options. Encouraged Completion of Personal Care Services Application & Submission to [facility] [phone]) for Processing, if Decision is To Remain in The Home. Encouraged Contact with CSW (# [phone]), if You Have Questions, Need Assistance, or If Additional Social Work Needs Are Identified Between Now & Our Next Scheduled Follow-Up Outreach Call.      Our next appointment is by telephone on  09/23/2022 at 9:45 am.  Please call the care guide team at [phone] if you need to cancel or reschedule your appointment.   If you are experiencing a Mental Health or Behavioral Health Crisis or need someone to talk to, please call the Suicide and Crisis Lifeline: 988 call the [place] National Suicide Prevention Lifeline: 4370097962 or TTY: 252-616-9733 TTY 3136882565) to talk to a trained counselor call 1-800-273-TALK (toll free, 24 hour hotline) go to [facility] [address] [place] [phone]) call the [facility] Crisis Line: [phone] call 911  Patient verbalizes understanding of instructions and care plan provided today and agrees to view in MyChart. Active MyChart status and patient understanding of how to access instructions and care plan via MyChart confirmed with patient.     Telephone follow up appointment with care management team member scheduled for:  09/23/2022 at 9:45 am.  Danford Bad, BSW, MSW, LCSW  Licensed Clinical Social Worker  [facility]  Mailing [place].  [address], [place], [place] [address] Physical Address-[address] [place], [place] [address] Toll Free Main # [phone] Fax # [phone] Cell # [phone] [email or web address]

## 2022-08-30 ENCOUNTER — Inpatient Hospital Stay: Payer: 59

## 2022-08-30 DIAGNOSIS — Z79899 Other long term (current) drug therapy: Secondary | ICD-10-CM | POA: Diagnosis not present

## 2022-08-30 DIAGNOSIS — F17210 Nicotine dependence, cigarettes, uncomplicated: Secondary | ICD-10-CM | POA: Diagnosis not present

## 2022-08-30 DIAGNOSIS — I10 Essential (primary) hypertension: Secondary | ICD-10-CM | POA: Diagnosis not present

## 2022-08-30 DIAGNOSIS — N183 Chronic kidney disease, stage 3 unspecified: Secondary | ICD-10-CM | POA: Diagnosis not present

## 2022-08-30 DIAGNOSIS — I959 Hypotension, unspecified: Secondary | ICD-10-CM | POA: Diagnosis not present

## 2022-08-30 DIAGNOSIS — E876 Hypokalemia: Secondary | ICD-10-CM | POA: Diagnosis not present

## 2022-08-30 DIAGNOSIS — Z9889 Other specified postprocedural states: Secondary | ICD-10-CM | POA: Diagnosis not present

## 2022-08-30 DIAGNOSIS — N1832 Chronic kidney disease, stage 3b: Secondary | ICD-10-CM | POA: Diagnosis not present

## 2022-08-30 DIAGNOSIS — R809 Proteinuria, unspecified: Secondary | ICD-10-CM | POA: Diagnosis not present

## 2022-08-30 DIAGNOSIS — I130 Hypertensive heart and chronic kidney disease with heart failure and stage 1 through stage 4 chronic kidney disease, or unspecified chronic kidney disease: Secondary | ICD-10-CM | POA: Diagnosis not present

## 2022-08-30 DIAGNOSIS — I5043 Acute on chronic combined systolic (congestive) and diastolic (congestive) heart failure: Secondary | ICD-10-CM | POA: Diagnosis not present

## 2022-08-31 ENCOUNTER — Inpatient Hospital Stay: Payer: 59

## 2022-09-01 ENCOUNTER — Ambulatory Visit: Payer: 59 | Admitting: Nurse Practitioner

## 2022-09-02 ENCOUNTER — Ambulatory Visit: Payer: Self-pay | Admitting: *Deleted

## 2022-09-02 NOTE — Patient Outreach (Signed)
  Care Coordination   09/02/2022 Name: Michael [name] MRN: [id number] DOB: 1955-03-14   Care Coordination Outreach Attempts:  An unsuccessful telephone outreach was attempted for a scheduled appointment today.  Follow Up Plan:  Additional outreach attempts will be made to offer the patient care coordination information and services.   Encounter Outcome:  No Answer   Care Coordination Interventions:  No, not indicated     L. Noelle Penner, RN, BSN, CCM [facility] Care Management Community Coordinator Office number [phone]

## 2022-09-05 DIAGNOSIS — N183 Chronic kidney disease, stage 3 unspecified: Secondary | ICD-10-CM | POA: Diagnosis not present

## 2022-09-05 DIAGNOSIS — E559 Vitamin D deficiency, unspecified: Secondary | ICD-10-CM | POA: Diagnosis not present

## 2022-09-05 DIAGNOSIS — R7301 Impaired fasting glucose: Secondary | ICD-10-CM | POA: Diagnosis not present

## 2022-09-05 DIAGNOSIS — E876 Hypokalemia: Secondary | ICD-10-CM | POA: Diagnosis not present

## 2022-09-05 DIAGNOSIS — J449 Chronic obstructive pulmonary disease, unspecified: Secondary | ICD-10-CM | POA: Diagnosis not present

## 2022-09-05 DIAGNOSIS — C44311 Basal cell carcinoma of skin of nose: Secondary | ICD-10-CM | POA: Diagnosis not present

## 2022-09-05 DIAGNOSIS — D509 Iron deficiency anemia, unspecified: Secondary | ICD-10-CM | POA: Diagnosis not present

## 2022-09-05 DIAGNOSIS — R55 Syncope and collapse: Secondary | ICD-10-CM | POA: Diagnosis not present

## 2022-09-05 DIAGNOSIS — I2510 Atherosclerotic heart disease of native coronary artery without angina pectoris: Secondary | ICD-10-CM | POA: Diagnosis not present

## 2022-09-05 DIAGNOSIS — I5043 Acute on chronic combined systolic (congestive) and diastolic (congestive) heart failure: Secondary | ICD-10-CM | POA: Diagnosis not present

## 2022-09-05 DIAGNOSIS — I130 Hypertensive heart and chronic kidney disease with heart failure and stage 1 through stage 4 chronic kidney disease, or unspecified chronic kidney disease: Secondary | ICD-10-CM | POA: Diagnosis not present

## 2022-09-05 DIAGNOSIS — R0600 Dyspnea, unspecified: Secondary | ICD-10-CM | POA: Diagnosis not present

## 2022-09-05 DIAGNOSIS — D126 Benign neoplasm of colon, unspecified: Secondary | ICD-10-CM | POA: Diagnosis not present

## 2022-09-06 ENCOUNTER — Encounter: Payer: Self-pay | Admitting: Emergency Medicine

## 2022-09-06 ENCOUNTER — Inpatient Hospital Stay: Payer: 59

## 2022-09-07 ENCOUNTER — Ambulatory Visit: Payer: 59

## 2022-09-08 ENCOUNTER — Institutional Professional Consult (permissible substitution): Payer: 59 | Admitting: Internal Medicine

## 2022-09-09 ENCOUNTER — Inpatient Hospital Stay: Payer: 59 | Attending: Hematology

## 2022-09-09 VITALS — BP 160/92 | HR 63 | Temp 97.2°F | Resp 18

## 2022-09-09 DIAGNOSIS — D509 Iron deficiency anemia, unspecified: Secondary | ICD-10-CM | POA: Diagnosis not present

## 2022-09-09 DIAGNOSIS — D500 Iron deficiency anemia secondary to blood loss (chronic): Secondary | ICD-10-CM

## 2022-09-09 MED ORDER — SODIUM CHLORIDE 0.9 % IV SOLN: Freq: Once | INTRAVENOUS | Status: AC

## 2022-09-09 MED ORDER — CLONIDINE HCL 0.1 MG PO TABS
0.2000 mg | ORAL_TABLET | Freq: Once | ORAL | Status: AC
  Administered 2022-09-09: 0.2 mg via ORAL
  Filled 2022-09-09: qty 2

## 2022-09-09 MED ORDER — CETIRIZINE HCL 10 MG PO TABS
10.0000 mg | ORAL_TABLET | Freq: Once | ORAL | Status: AC
  Administered 2022-09-09: 10 mg via ORAL
  Filled 2022-09-09: qty 1

## 2022-09-09 MED ORDER — SODIUM CHLORIDE 0.9 % IV SOLN
300.0000 mg | Freq: Once | INTRAVENOUS | Status: AC
  Administered 2022-09-09: 300 mg via INTRAVENOUS
  Filled 2022-09-09: qty 300

## 2022-09-09 MED ORDER — ACETAMINOPHEN 325 MG PO TABS
650.0000 mg | ORAL_TABLET | Freq: Once | ORAL | Status: AC
  Administered 2022-09-09: 650 mg via ORAL
  Filled 2022-09-09: qty 2

## 2022-09-09 NOTE — Progress Notes (Signed)
Patient presents today for iron infusion. Patient is in satisfactory condition with no new complaints voiced.  Vital signs are stable.  We will proceed with infusion per provider orders.    Peripheral IV started with good blood pre and post infusion.  Treatment given today per MD orders. Tolerated infusion without adverse affects. Vital signs stable. No complaints at this time. Discharged from clinic ambulatory in stable condition. Alert and oriented x 3. F/U with [facility] as scheduled.

## 2022-09-09 NOTE — Patient Instructions (Signed)
[facility]  Discharge Instructions: Thank you for choosing [facility] to provide your oncology and hematology care.  If you have a lab appointment with the Cancer Center - please note that after April 8th, 2024, all labs will be drawn in the cancer center.  You do not have to check in or register with the main entrance as you have in the past but will complete your check-in in the cancer center.  Wear comfortable clothing and clothing appropriate for easy access to any Portacath or PICC line.   We strive to give you quality time with your provider. You may need to reschedule your appointment if you arrive late (15 or more minutes).  Arriving late affects you and other patients whose appointments are after yours.  Also, if you miss three or more appointments without notifying the office, you may be dismissed from the clinic at the provider's discretion.      For prescription refill requests, have your pharmacy contact our office and allow 72 hours for refills to be completed.    Today you received Venofer IV iron infusion.  BELOW ARE SYMPTOMS THAT SHOULD BE REPORTED IMMEDIATELY: *FEVER GREATER THAN 100.4 F (38 C) OR HIGHER *CHILLS OR SWEATING *NAUSEA AND VOMITING THAT IS NOT CONTROLLED WITH YOUR NAUSEA MEDICATION *UNUSUAL SHORTNESS OF BREATH *UNUSUAL BRUISING OR BLEEDING *URINARY PROBLEMS (pain or burning when urinating, or frequent urination) *BOWEL PROBLEMS (unusual diarrhea, constipation, pain near the anus) TENDERNESS IN MOUTH AND THROAT WITH OR WITHOUT PRESENCE OF ULCERS (sore throat, sores in mouth, or a toothache) UNUSUAL RASH, SWELLING OR PAIN  UNUSUAL VAGINAL DISCHARGE OR ITCHING   Items with * indicate a potential emergency and should be followed up as soon as possible or go to the Emergency Department if any problems should occur.  Please show the CHEMOTHERAPY ALERT CARD or IMMUNOTHERAPY ALERT CARD at check-in to the Emergency Department and  triage nurse.  Should you have questions after your visit or need to cancel or reschedule your appointment, please contact [facility] [phone]  and follow the prompts.  Office hours are 8:00 a.m. to 4:30 p.m. Monday - Friday. Please note that voicemails left after 4:00 p.m. may not be returned until the following business day.  We are closed weekends and major holidays. You have access to a nurse at all times for urgent questions. Please call the main number to the clinic [phone] and follow the prompts.  For any non-urgent questions, you may also contact your provider using MyChart. We now offer e-Visits for anyone 18 and older to request care online for non-urgent symptoms. For details visit [email or web address].   Also download the MyChart app! Go to the app store, search "MyChart", open the app, select [facility], and log in with your MyChart username and password.   

## 2022-09-15 MED FILL — Iron Sucrose Inj 20 MG/ML (Fe Equiv): INTRAVENOUS | Qty: 15 | Status: AC

## 2022-09-16 ENCOUNTER — Inpatient Hospital Stay: Payer: 59

## 2022-09-16 VITALS — BP 191/89 | HR 71 | Temp 96.9°F | Resp 18

## 2022-09-16 DIAGNOSIS — D500 Iron deficiency anemia secondary to blood loss (chronic): Secondary | ICD-10-CM

## 2022-09-16 DIAGNOSIS — D509 Iron deficiency anemia, unspecified: Secondary | ICD-10-CM | POA: Diagnosis not present

## 2022-09-16 MED ORDER — ACETAMINOPHEN 325 MG PO TABS
650.0000 mg | ORAL_TABLET | Freq: Once | ORAL | Status: AC
  Administered 2022-09-16: 650 mg via ORAL
  Filled 2022-09-16: qty 2

## 2022-09-16 MED ORDER — SODIUM CHLORIDE 0.9 % IV SOLN
300.0000 mg | Freq: Once | INTRAVENOUS | Status: AC
  Administered 2022-09-16: 300 mg via INTRAVENOUS
  Filled 2022-09-16: qty 300

## 2022-09-16 MED ORDER — CETIRIZINE HCL 10 MG PO TABS
10.0000 mg | ORAL_TABLET | Freq: Once | ORAL | Status: AC
  Administered 2022-09-16: 10 mg via ORAL
  Filled 2022-09-16: qty 1

## 2022-09-16 MED ORDER — SODIUM CHLORIDE 0.9 % IV SOLN: Freq: Once | INTRAVENOUS | Status: AC

## 2022-09-16 NOTE — Progress Notes (Signed)
Patient presents today for iron infusion. Patient is in satisfactory condition with no new complaints voiced.  Vital signs are stable.  We will proceed with infusion per provider orders.    Peripheral IV started with good blood return pre and post infusion.  Venofer 300 mg given today per MD orders. Tolerated infusion without adverse affects. Vital signs stable. No complaints at this time. Discharged from clinic ambulatory in stable condition. Alert and oriented x 3. F/U with [facility] as scheduled.   

## 2022-09-16 NOTE — Patient Instructions (Signed)
 [facility]  Discharge Instructions: Thank you for choosing [facility] to provide your oncology and hematology care.  If you have a lab appointment with the Cancer Center - please note that after April 8th, 2024, all labs will be drawn in the cancer center.  You do not have to check in or register with the main entrance as you have in the past but will complete your check-in in the cancer center.  Wear comfortable clothing and clothing appropriate for easy access to any Portacath or PICC line.   We strive to give you quality time with your provider. You may need to reschedule your appointment if you arrive late (15 or more minutes).  Arriving late affects you and other patients whose appointments are after yours.  Also, if you miss three or more appointments without notifying the office, you may be dismissed from the clinic at the provider's discretion.      For prescription refill requests, have your pharmacy contact our office and allow 72 hours for refills to be completed.    Today you received Venofer 300 mg IV iron infusion.     BELOW ARE SYMPTOMS THAT SHOULD BE REPORTED IMMEDIATELY: *FEVER GREATER THAN 100.4 F (38 C) OR HIGHER *CHILLS OR SWEATING *NAUSEA AND VOMITING THAT IS NOT CONTROLLED WITH YOUR NAUSEA MEDICATION *UNUSUAL SHORTNESS OF BREATH *UNUSUAL BRUISING OR BLEEDING *URINARY PROBLEMS (pain or burning when urinating, or frequent urination) *BOWEL PROBLEMS (unusual diarrhea, constipation, pain near the anus) TENDERNESS IN MOUTH AND THROAT WITH OR WITHOUT PRESENCE OF ULCERS (sore throat, sores in mouth, or a toothache) UNUSUAL RASH, SWELLING OR PAIN  UNUSUAL VAGINAL DISCHARGE OR ITCHING   Items with * indicate a potential emergency and should be followed up as soon as possible or go to the Emergency Department if any problems should occur.  Please show the CHEMOTHERAPY ALERT CARD or IMMUNOTHERAPY ALERT CARD at check-in to the Emergency  Department and triage nurse.  Should you have questions after your visit or need to cancel or reschedule your appointment, please contact [facility] [phone]  and follow the prompts.  Office hours are 8:00 a.m. to 4:30 p.m. Monday - Friday. Please note that voicemails left after 4:00 p.m. may not be returned until the following business day.  We are closed weekends and major holidays. You have access to a nurse at all times for urgent questions. Please call the main number to the clinic [phone] and follow the prompts.  For any non-urgent questions, you may also contact your provider using MyChart. We now offer e-Visits for anyone 68 and older to request care online for non-urgent symptoms. For details visit [email or web address].   Also download the MyChart app! Go to the app store, search "MyChart", open the app, select [facility], and log in with your MyChart username and password.

## 2022-09-26 ENCOUNTER — Encounter: Payer: Self-pay | Admitting: *Deleted

## 2022-09-26 ENCOUNTER — Ambulatory Visit: Payer: Self-pay | Admitting: *Deleted

## 2022-09-26 NOTE — Patient Instructions (Signed)
Visit Information  Thank you for taking time to visit with me today. Please don't hesitate to contact me if I can be of assistance to you.   Following are the goals we discussed today:   Goals Addressed               This Visit's Progress     Receive Assistance Completing Medicaid Application & Pursuing Higher Level of Care Options. (pt-stated)   On track     Care Coordination Interventions:  Interventions Today    Flowsheet Row Most Recent Value  Chronic Disease   Chronic disease during today's visit Chronic Obstructive Pulmonary Disease (COPD), Congestive Heart Failure (CHF), Chronic Kidney Disease/End Stage Renal Disease (ESRD), Hypertension (HTN), Other  [Inability to Perform Activities of Daily Living Independently]  General Interventions   General Interventions Discussed/Reviewed General Interventions Discussed, Labs, Vaccines, Doctor Visits, Communication with, Level of Care, [employer], Horticulturist, commercial (DME), Lipid Profile, Annual Foot Exam, Health Screening, General Interventions Reviewed, Annual Eye Exam  [Encouraged]  Labs Hgb A1c every 3 months, Kidney Function  [Encouraged]  Vaccines Tetanus/Pertussis/Diphtheria, Shingles, RSV, Pneumonia, Flu, COVID-19  [Encouraged]  Doctor Visits Discussed/Reviewed Doctor Visits Discussed, Specialist, Doctor Visits Reviewed, Annual Wellness Visits, PCP  [Encouraged]  Health Screening Bone Density, Colonoscopy, Prostate  [Encouraged]  Durable Medical Equipment (DME) Wheelchair  [Encouraged]  Wheelchair Standard  [Encouraged]  PCP/Specialist Visits Compliance with follow-up visit  [Encouraged]  Communication with PCP/Specialists, RN  [Encouraged]  Level of Care Adult Daycare, Personal Care Services, Applications, Assisted Living, Skilled Nursing Facility  [Encouraged]  Applications Medicaid, Personal Care Services, [place]-2  [Encouraged]  Exercise Interventions   Exercise Discussed/Reviewed Assistive device use and  maintanence, Exercise Discussed, Exercise Reviewed, Physical Activity, Weight Managment  [Encouraged]  Physical Activity Discussed/Reviewed Home Exercise Program (HEP), Physical Activity Discussed, Physical Activity Reviewed, Types of exercise  [Encouraged]  Weight Management Weight maintenance  [Encouraged]  Education Interventions   Education Provided Provided Therapist, sports, Provided Web-based Education, Provided Education  [Encouraged]  Provided Verbal Education On Blood Sugar Monitoring, Medication, Nutrition, Mental Health/Coping with Illness, When to see the doctor, Sick Day Rules, Foot Care, Eye Care, [employer], Air traffic controller, Exercise, [employer], Insurance Plans  [Encouraged]  Labs Reviewed Hgb A1c  [Encouraged]  Applications Medicaid, Personal Care Services, [place]-2  [Encouraged]  Mental Health Interventions   Mental Health Discussed/Reviewed Mental Health Discussed, Anxiety, Depression, Grief and Loss, Substance Abuse, Mental Health Reviewed, Coping Strategies, Crisis, Other, Suicide  [Domestic Violence]  Nutrition Interventions   Nutrition Discussed/Reviewed Nutrition Discussed, Adding fruits and vegetables, Increasing proteins, Decreasing fats, Decreasing salt, Supplemental nutrition, Decreasing sugar intake, Portion sizes, Carbohydrate meal planning, Fluid intake, Nutrition Reviewed  [Encouraged]  Pharmacy Interventions   Pharmacy Dicussed/Reviewed Pharmacy Topics Discussed, Medications and their functions, Medication Adherence, Pharmacy Topics Reviewed, Affording Medications  [Encouraged]  Safety Interventions   Safety Discussed/Reviewed Safety Discussed, Safety Reviewed, Fall Risk, Home Safety  [Encouraged]  Home Safety Assistive Devices, Need for home safety assessment, Refer for home visit, Refer for community resources, Contact provider for referral to PT/OT, Contact home health agency  [Encouraged]  Advanced Directive Interventions   Advanced Directives Discussed/Reviewed  Advanced Directives Discussed  [Encouraged]  End of Life Palliative  [Encouraged]      Active Listening & Reflection Utilized.  Verbalization of Feelings Encouraged.  Emotional Support Provided. Problem Solving Interventions Activated. Task-Centered Solutions Implemented.   Solution-Focused Strategies Employed. Acceptance & Commitment Therapy Performed. Cognitive Behavioral Therapy Initiated. Client-Centered Therapy Enacted. Encouraged Continued Involvement with Palliative Care  Services, through [name] [phone]). Encouraged Continued Administration of Medications by Friend/Caregiver, [name], Exactly as Prescribed. Encouraged Increased Level of Activity & Exercise, Via Supervision by Friend/Caregiver, [name], as Tolerated. Encouraged Continued Implementation of Deep Breathing Exercises, Relaxation Techniques, & Mindfulness Meditation Strategies Daily. Confirmed Disinterest in Pursuing Higher Level of Care Placement Options in [facility], from List Provided, at Present Time.  Confirmed Disinterest in Pursuing [employer], [employer], & Resources in [place], from List Provided, at Present Time. Confirmed Disinterest in Applying for Personal Care Services, through [facility] [phone]), at Present Time. Encouraged Contact with CSW (# [phone]), if You Have Questions, Need Assistance, or If Additional Social Work Needs Are Identified Between Now & Our Next Follow-Up Outreach Call, Scheduled on 10/11/2022 at 11:45 AM.      Our next appointment is by telephone on 10/11/2022 at 11:45 am.  Please call the care guide team at [phone] if you need to cancel or reschedule your appointment.   If you are experiencing a Mental Health or Behavioral Health Crisis or need someone to talk to, please call the Suicide and Crisis Lifeline: 988 call the [place] National Suicide Prevention Lifeline: (561) 194-1843 or TTY:  602-099-4778 TTY 301-495-9796) to talk to a trained counselor call 1-800-273-TALK (toll free, 24 hour hotline) go to [facility] [address], [place] [phone]) call the [facility] Crisis Line: [phone] call 911  Patient verbalizes understanding of instructions and care plan provided today and agrees to view in MyChart. Active MyChart status and patient understanding of how to access instructions and care plan via MyChart confirmed with patient.     Telephone follow up appointment with care management team member scheduled for: 10/11/2022 at 11:45 am.  Danford Bad, BSW, MSW, LCSW  Embedded Practice Social Work Case Manager  [facility], Population Health Direct Dial: [phone]  Fax: [phone] Email: [email or web address] Website: [email or web address]

## 2022-09-26 NOTE — Patient Outreach (Signed)
Care Coordination   Follow Up Visit Note   09/26/2022  Name: Michael [name] MRN: [id number] DOB: [birth year]  Michael [name] is a 67 y.o. year old male who sees [name], [name], MD for primary care. I spoke with [name] and friend/caregiver, [name] by phone today.  What matters to the patients health and wellness today?  Receive Assistance Completing Medicaid Application & Pursuing Higher Level of Care Options.   Goals Addressed               This Visit's Progress     Receive Assistance Completing Medicaid Application & Pursuing Higher Level of Care Options. (pt-stated)   On track     Care Coordination Interventions:  Interventions Today    Flowsheet Row Most Recent Value  Chronic Disease   Chronic disease during today's visit Chronic Obstructive Pulmonary Disease (COPD), Congestive Heart Failure (CHF), Chronic Kidney Disease/End Stage Renal Disease (ESRD), Hypertension (HTN), Other  [Inability to Perform Activities of Daily Living Independently]  General Interventions   General Interventions Discussed/Reviewed General Interventions Discussed, Labs, Vaccines, Doctor Visits, Communication with, Level of Care, [employer], Horticulturist, commercial (DME), Lipid Profile, Annual Foot Exam, Health Screening, General Interventions Reviewed, Annual Eye Exam  [Encouraged]  Labs Hgb A1c every 3 months, Kidney Function  [Encouraged]  Vaccines Tetanus/Pertussis/Diphtheria, Shingles, RSV, Pneumonia, Flu, COVID-19  [Encouraged]  Doctor Visits Discussed/Reviewed Doctor Visits Discussed, Specialist, Doctor Visits Reviewed, Annual Wellness Visits, PCP  [Encouraged]  Health Screening Bone Density, Colonoscopy, Prostate  [Encouraged]  Durable Medical Equipment (DME) Wheelchair  [Encouraged]  Wheelchair Standard  [Encouraged]  PCP/Specialist Visits Compliance with follow-up visit  [Encouraged]  Communication with PCP/Specialists, RN  [Encouraged]  Level of Care Adult Daycare,  Personal Care Services, Applications, Assisted Living, Skilled Nursing Facility  [Encouraged]  Applications Medicaid, Personal Care Services, [place]-2  [Encouraged]  Exercise Interventions   Exercise Discussed/Reviewed Assistive device use and maintanence, Exercise Discussed, Exercise Reviewed, Physical Activity, Weight Managment  [Encouraged]  Physical Activity Discussed/Reviewed Home Exercise Program (HEP), Physical Activity Discussed, Physical Activity Reviewed, Types of exercise  [Encouraged]  Weight Management Weight maintenance  [Encouraged]  Education Interventions   Education Provided Provided Therapist, sports, Provided Web-based Education, Provided Education  [Encouraged]  Provided Verbal Education On Blood Sugar Monitoring, Medication, Nutrition, Mental Health/Coping with Illness, When to see the doctor, Sick Day Rules, Foot Care, Eye Care, [employer], Air traffic controller, Exercise, [employer], Insurance Plans  [Encouraged]  Labs Reviewed Hgb A1c  [Encouraged]  Applications Medicaid, Personal Care Services, [place]-2  [Encouraged]  Mental Health Interventions   Mental Health Discussed/Reviewed Mental Health Discussed, Anxiety, Depression, Grief and Loss, Substance Abuse, Mental Health Reviewed, Coping Strategies, Crisis, Other, Suicide  [Domestic Violence]  Nutrition Interventions   Nutrition Discussed/Reviewed Nutrition Discussed, Adding fruits and vegetables, Increasing proteins, Decreasing fats, Decreasing salt, Supplemental nutrition, Decreasing sugar intake, Portion sizes, Carbohydrate meal planning, Fluid intake, Nutrition Reviewed  [Encouraged]  Pharmacy Interventions   Pharmacy Dicussed/Reviewed Pharmacy Topics Discussed, Medications and their functions, Medication Adherence, Pharmacy Topics Reviewed, Affording Medications  [Encouraged]  Safety Interventions   Safety Discussed/Reviewed Safety Discussed, Safety Reviewed, Fall Risk, Home Safety  [Encouraged]  Home Safety Assistive Devices,  Need for home safety assessment, Refer for home visit, Refer for community resources, Contact provider for referral to PT/OT, Contact home health agency  [Encouraged]  Advanced Directive Interventions   Advanced Directives Discussed/Reviewed Advanced Directives Discussed  [Encouraged]  End of Life Palliative  [Encouraged]      Active Listening & Reflection Utilized.  Verbalization  of Feelings Encouraged.  Emotional Support Provided. Problem Solving Interventions Activated. Task-Centered Solutions Implemented.   Solution-Focused Strategies Employed. Acceptance & Commitment Therapy Performed. Cognitive Behavioral Therapy Initiated. Client-Centered Therapy Enacted. Encouraged Continued Involvement with Palliative Care Services, through [facility] [phone]). Encouraged Continued Administration of Medications by Friend/Caregiver, [name], Exactly as Prescribed. Encouraged Increased Level of Activity & Exercise, Via Supervision by Friend/Caregiver, [name], as Tolerated. Encouraged Continued Implementation of Deep Breathing Exercises, Relaxation Techniques, & Mindfulness Meditation Strategies Daily. Confirmed Disinterest in Pursuing Higher Level of Care Placement Options in [facility], from List Provided, at Present Time.  Confirmed Disinterest in Pursuing [employer], [employer], & Resources in [place], from List Provided, at Present Time. Confirmed Disinterest in Applying for Personal Care Services, through [facility] [phone]), at Present Time. Encouraged Contact with CSW (# [phone]), if You Have Questions, Need Assistance, or If Additional Social Work Needs Are Identified Between Now & Our Next Follow-Up Outreach Call, Scheduled on 10/11/2022 at 11:45 AM.      SDOH assessments and interventions completed:  Yes.  Care Coordination Interventions:  Yes, provided.   Follow up plan: Follow up call scheduled for  10/11/2022 at 11:45 am.  Encounter Outcome:  Pt. Visit Completed.   Danford Bad, BSW, MSW, Printmaker Social Work Case Set designer Health  [facility], Population Health Direct Dial: [phone]  Fax: [phone] Email: [email or web address]: [email or web address]

## 2022-09-28 DIAGNOSIS — M79675 Pain in left toe(s): Secondary | ICD-10-CM | POA: Diagnosis not present

## 2022-09-28 DIAGNOSIS — L110 Acquired keratosis follicularis: Secondary | ICD-10-CM | POA: Diagnosis not present

## 2022-09-28 DIAGNOSIS — M79671 Pain in right foot: Secondary | ICD-10-CM | POA: Diagnosis not present

## 2022-09-28 DIAGNOSIS — I739 Peripheral vascular disease, unspecified: Secondary | ICD-10-CM | POA: Diagnosis not present

## 2022-09-28 DIAGNOSIS — M79674 Pain in right toe(s): Secondary | ICD-10-CM | POA: Diagnosis not present

## 2022-09-28 DIAGNOSIS — L609 Nail disorder, unspecified: Secondary | ICD-10-CM | POA: Diagnosis not present

## 2022-09-28 DIAGNOSIS — M79672 Pain in left foot: Secondary | ICD-10-CM | POA: Diagnosis not present

## 2022-10-11 ENCOUNTER — Ambulatory Visit: Payer: Self-pay | Admitting: *Deleted

## 2022-10-11 ENCOUNTER — Encounter: Payer: Self-pay | Admitting: *Deleted

## 2022-10-11 NOTE — Patient Outreach (Signed)
Care Coordination   Follow Up Visit Note   10/11/2022  Name: Michael [name] MRN: [id number] DOB: [birth year]  Michael [name] is a 67 y.o. year old male who sees [name], [name], MD for primary care. I spoke with [name] and friends/landlords, [name] and [name] by phone today.  What matters to the patients health and wellness today?  Receive Assistance Completing Medicaid Application & Pursuing Higher Level of Care Options.    Goals Addressed               This Visit's Progress     Receive Assistance Completing Medicaid Application & Pursuing Higher Level of Care Options. (pt-stated)   On track     Care Coordination Interventions:  Interventions Today    Flowsheet Row Most Recent Value  Chronic Disease   Chronic disease during today's visit Chronic Obstructive Pulmonary Disease (COPD), Congestive Heart Failure (CHF), Chronic Kidney Disease/End Stage Renal Disease (ESRD), Hypertension (HTN), Other  [Inability to Perform Activities of Daily Living Independently]  General Interventions   General Interventions Discussed/Reviewed General Interventions Discussed, Labs, Vaccines, Doctor Visits, Communication with, Level of Care, [employer], Horticulturist, commercial (DME), Lipid Profile, Annual Foot Exam, Health Screening, General Interventions Reviewed, Annual Eye Exam  [Encouraged]  Labs Hgb A1c every 3 months, Kidney Function  [Encouraged]  Vaccines Tetanus/Pertussis/Diphtheria, Shingles, RSV, Pneumonia, Flu, COVID-19  [Encouraged]  Doctor Visits Discussed/Reviewed Doctor Visits Discussed, Specialist, Doctor Visits Reviewed, Annual Wellness Visits, PCP  [Encouraged]  Health Screening Bone Density, Colonoscopy, Prostate  [Encouraged]  Durable Medical Equipment (DME) Wheelchair  [Encouraged]  Wheelchair Standard  [Encouraged]  PCP/Specialist Visits Compliance with follow-up visit  [Encouraged]  Communication with PCP/Specialists, RN  [Encouraged]  Level of Care Adult  Daycare, Personal Care Services, Applications, Assisted Living, Skilled Nursing Facility  [Encouraged]  Applications Medicaid, Personal Care Services, [place]  [Encouraged]  Exercise Interventions   Exercise Discussed/Reviewed Assistive device use and maintanence, Exercise Discussed, Exercise Reviewed, Physical Activity, Weight Managment  [Encouraged]  Physical Activity Discussed/Reviewed Home Exercise Program (HEP), Physical Activity Discussed, Physical Activity Reviewed, Types of exercise  [Encouraged]  Weight Management Weight maintenance  [Encouraged]  Education Interventions   Education Provided Provided Therapist, sports, Provided Web-based Education, Provided Education  [Encouraged]  Provided Verbal Education On Blood Sugar Monitoring, Medication, Nutrition, Mental Health/Coping with Illness, When to see the doctor, Sick Day Rules, Foot Care, Eye Care, [employer], Air traffic controller, Exercise, [employer], Insurance Plans  [Encouraged]  Labs Reviewed Hgb A1c  [Encouraged]  Applications Medicaid, Personal Care Services, [place]  [Encouraged]  Mental Health Interventions   Mental Health Discussed/Reviewed Mental Health Discussed, Anxiety, Depression, Grief and Loss, Substance Abuse, Mental Health Reviewed, Coping Strategies, Crisis, Other, Suicide  [Domestic Violence]  Nutrition Interventions   Nutrition Discussed/Reviewed Nutrition Discussed, Adding fruits and vegetables, Increasing proteins, Decreasing fats, Decreasing salt, Supplemental nutrition, Decreasing sugar intake, Portion sizes, Carbohydrate meal planning, Fluid intake, Nutrition Reviewed  [Encouraged]  Pharmacy Interventions   Pharmacy Dicussed/Reviewed Pharmacy Topics Discussed, Medications and their functions, Medication Adherence, Pharmacy Topics Reviewed, Affording Medications  [Encouraged]  Safety Interventions   Safety Discussed/Reviewed Safety Discussed, Safety Reviewed, Fall Risk, Home Safety  [Encouraged]  Home Safety Assistive  Devices, Need for home safety assessment, Refer for home visit, Refer for community resources, Contact provider for referral to PT/OT, Contact home health agency  [Encouraged]  Advanced Directive Interventions   Advanced Directives Discussed/Reviewed Advanced Directives Discussed  [Encouraged]  End of Life Palliative  [Encouraged]      Active Listening & Reflection  Utilized.  Verbalization of Feelings Encouraged.  Emotional Support Provided. Problem Solving Interventions Revisited. Task-Centered Solutions Implemented   Solution-Focused Strategies Indicated. Acceptance & Commitment Therapy Conducted. Cognitive Behavioral Therapy Performed. Client-Centered Therapy Initiated. Confirmed Intentions to Continue Residing with Friends/Landlords, [employer] & [name] to Receive Assistance with Activities of Daily Living, Care, & Supervision. Confirmed Continued Disinterest in Pursuing Higher Level of Care Placement Options, Changing Medicaid to Special Assistance Long-Term Care Medicaid, or Applying for Personal Care Services. Encouraged Daily Implementation of Deep Breathing Exercises, Relaxation Techniques, & Mindfulness Meditation Strategies. Encouraged Administration of Medications, Exactly as Prescribed. Encouraged Contact with CSW (# [phone]), if You Have Questions, Need Assistance, or If Additional Social Work Needs Are Identified Between Now & Our Next Follow-Up Outreach Call, Scheduled on 10/26/2022 at 12:45 PM. Encouraged Attendance at Follow-Up Appointment with Rojelio Brenner, Physician Assistant with [facility] [phone]), Scheduled on 11/29/2022 at 10:00 AM.      SDOH assessments and interventions completed:  Yes.  Care Coordination Interventions:  Yes, provided.   Follow up plan: Follow up call scheduled for 10/26/2022 at 12:45 pm.  Encounter Outcome:  Patient Visit Completed.   Danford Bad, BSW, MSW, Printmaker  Social Work Case Set designer Health  [facility], Population Health Direct Dial: [phone]  Fax: [phone] Email: [email or web address]: [email or web address]

## 2022-10-11 NOTE — Patient Instructions (Signed)
Visit Information  Thank you for taking time to visit with me today. Please don't hesitate to contact me if I can be of assistance to you.   Following are the goals we discussed today:   Goals Addressed               This Visit's Progress     Receive Assistance Completing Medicaid Application & Pursuing Higher Level of Care Options. (pt-stated)   On track     Care Coordination Interventions:  Interventions Today    Flowsheet Row Most Recent Value  Chronic Disease   Chronic disease during today's visit Chronic Obstructive Pulmonary Disease (COPD), Congestive Heart Failure (CHF), Chronic Kidney Disease/End Stage Renal Disease (ESRD), Hypertension (HTN), Other  [Inability to Perform Activities of Daily Living Independently]  General Interventions   General Interventions Discussed/Reviewed General Interventions Discussed, Labs, Vaccines, Doctor Visits, Communication with, Level of Care, [employer], Horticulturist, commercial (DME), Lipid Profile, Annual Foot Exam, Health Screening, General Interventions Reviewed, Annual Eye Exam  [Encouraged]  Labs Hgb A1c every 3 months, Kidney Function  [Encouraged]  Vaccines Tetanus/Pertussis/Diphtheria, Shingles, RSV, Pneumonia, Flu, COVID-19  [Encouraged]  Doctor Visits Discussed/Reviewed Doctor Visits Discussed, Specialist, Doctor Visits Reviewed, Annual Wellness Visits, PCP  [Encouraged]  Health Screening Bone Density, Colonoscopy, Prostate  [Encouraged]  Durable Medical Equipment (DME) Wheelchair  [Encouraged]  Wheelchair Standard  [Encouraged]  PCP/Specialist Visits Compliance with follow-up visit  [Encouraged]  Communication with PCP/Specialists, RN  [Encouraged]  Level of Care Adult Daycare, Personal Care Services, Applications, Assisted Living, Skilled Nursing Facility  [Encouraged]  Applications Medicaid, Personal Care Services, [place]-2  [Encouraged]  Exercise Interventions   Exercise Discussed/Reviewed Assistive device use and  maintanence, Exercise Discussed, Exercise Reviewed, Physical Activity, Weight Managment  [Encouraged]  Physical Activity Discussed/Reviewed Home Exercise Program (HEP), Physical Activity Discussed, Physical Activity Reviewed, Types of exercise  [Encouraged]  Weight Management Weight maintenance  [Encouraged]  Education Interventions   Education Provided Provided Therapist, sports, Provided Web-based Education, Provided Education  [Encouraged]  Provided Verbal Education On Blood Sugar Monitoring, Medication, Nutrition, Mental Health/Coping with Illness, When to see the doctor, Sick Day Rules, Foot Care, Eye Care, [employer], Air traffic controller, Exercise, [employer], Insurance Plans  [Encouraged]  Labs Reviewed Hgb A1c  [Encouraged]  Applications Medicaid, Personal Care Services, [place]-2  [Encouraged]  Mental Health Interventions   Mental Health Discussed/Reviewed Mental Health Discussed, Anxiety, Depression, Grief and Loss, Substance Abuse, Mental Health Reviewed, Coping Strategies, Crisis, Other, Suicide  [Domestic Violence]  Nutrition Interventions   Nutrition Discussed/Reviewed Nutrition Discussed, Adding fruits and vegetables, Increasing proteins, Decreasing fats, Decreasing salt, Supplemental nutrition, Decreasing sugar intake, Portion sizes, Carbohydrate meal planning, Fluid intake, Nutrition Reviewed  [Encouraged]  Pharmacy Interventions   Pharmacy Dicussed/Reviewed Pharmacy Topics Discussed, Medications and their functions, Medication Adherence, Pharmacy Topics Reviewed, Affording Medications  [Encouraged]  Safety Interventions   Safety Discussed/Reviewed Safety Discussed, Safety Reviewed, Fall Risk, Home Safety  [Encouraged]  Home Safety Assistive Devices, Need for home safety assessment, Refer for home visit, Refer for community resources, Contact provider for referral to PT/OT, Contact home health agency  [Encouraged]  Advanced Directive Interventions   Advanced Directives Discussed/Reviewed  Advanced Directives Discussed  [Encouraged]  End of Life Palliative  [Encouraged]      Active Listening & Reflection Utilized.  Verbalization of Feelings Encouraged.  Emotional Support Provided. Problem Solving Interventions Revisited. Task-Centered Solutions Implemented   Solution-Focused Strategies Indicated. Acceptance & Commitment Therapy Conducted. Cognitive Behavioral Therapy Performed. Client-Centered Therapy Initiated. Confirmed Intentions to Continue Residing with  Friends/Landlords, [name] to Receive Assistance with Activities of Daily Living, Care, & Supervision. Confirmed Continued Disinterest in Pursuing Higher Level of Care Placement Options, Changing Medicaid to Special Assistance Long-Term Care Medicaid, or Applying for Personal Care Services. Encouraged Daily Implementation of Deep Breathing Exercises, Relaxation Techniques, & Mindfulness Meditation Strategies. Encouraged Administration of Medications, Exactly as Prescribed. Encouraged Contact with CSW (# [phone]), if You Have Questions, Need Assistance, or If Additional Social Work Needs Are Identified Between Now & Our Next Follow-Up Outreach Call, Scheduled on 10/26/2022 at 12:45 PM. Encouraged Attendance at Follow-Up Appointment with Rojelio Brenner, Physician Assistant with [facility] at [facility] [phone]), Scheduled on 11/29/2022 at 10:00 AM.      Our next appointment is by telephone on 10/26/2022 at 12:45 pm. Please call the care guide team at [phone] if you need to cancel or reschedule your appointment.   If you are experiencing a Mental Health or Behavioral Health Crisis or need someone to talk to, please call the Suicide and Crisis Lifeline: 988 call the [place] National Suicide Prevention Lifeline: 848-750-9862 or TTY: 779-525-5956 TTY 718-138-1322) to talk to a trained counselor call 1-800-273-TALK (toll free, 24 hour hotline) go to [facility] [address], [place] [phone]) call the [facility] Crisis Line: [phone] call 911  Patient verbalizes understanding of instructions and care plan provided today and agrees to view in MyChart. Active MyChart status and patient understanding of how to access instructions and care plan via MyChart confirmed with patient.     Telephone follow up appointment with care management team member scheduled for:  10/26/2022 at 12:45 pm.  Danford Bad, BSW, MSW, LCSW  Embedded Practice Social Work Case Manager  [facility], Population Health Direct Dial: [phone]  Fax: [phone] Email: [email or web address] Website: [email or web address]

## 2022-10-13 DIAGNOSIS — H25813 Combined forms of age-related cataract, bilateral: Secondary | ICD-10-CM | POA: Diagnosis not present

## 2022-10-13 DIAGNOSIS — H16211 Exposure keratoconjunctivitis, right eye: Secondary | ICD-10-CM | POA: Diagnosis not present

## 2022-10-26 ENCOUNTER — Ambulatory Visit: Payer: Self-pay | Admitting: *Deleted

## 2022-10-26 NOTE — Patient Instructions (Signed)
Visit Information  Thank you for taking time to visit with me today. Please don't hesitate to contact me if I can be of assistance to you.   Following are the goals we discussed today:   Goals Addressed               This Visit's Progress     Receive Assistance Completing Medicaid Application & Pursuing Higher Level of Care Options. (pt-stated)   On track     Care Coordination Interventions:  Interventions Today    Flowsheet Row Most Recent Value  Chronic Disease   Chronic disease during today's visit Chronic Obstructive Pulmonary Disease (COPD), Congestive Heart Failure (CHF), Chronic Kidney Disease/End Stage Renal Disease (ESRD), Hypertension (HTN), Other  [Inability to Perform Activities of Daily Living Independently]  General Interventions   General Interventions Discussed/Reviewed General Interventions Discussed, Labs, Vaccines, Doctor Visits, Communication with, Level of Care, [employer], Horticulturist, commercial (DME), Lipid Profile, Annual Foot Exam, Health Screening, General Interventions Reviewed, Annual Eye Exam  [Encouraged]  Labs Hgb A1c every 3 months, Kidney Function  [Encouraged]  Vaccines Tetanus/Pertussis/Diphtheria, Shingles, RSV, Pneumonia, Flu, COVID-19  [Encouraged]  Doctor Visits Discussed/Reviewed Doctor Visits Discussed, Specialist, Doctor Visits Reviewed, Annual Wellness Visits, PCP  [Encouraged]  Health Screening Bone Density, Colonoscopy, Prostate  [Encouraged]  Durable Medical Equipment (DME) Wheelchair  [Encouraged]  Wheelchair Standard  [Encouraged]  PCP/Specialist Visits Compliance with follow-up visit  [Encouraged]  Communication with PCP/Specialists, RN  [Encouraged]  Level of Care Adult Daycare, Personal Care Services, Applications, Assisted Living, Skilled Nursing Facility  [Encouraged]  Applications Medicaid, Personal Care Services, [place]-2  [Encouraged]  Exercise Interventions   Exercise Discussed/Reviewed Assistive device use and  maintanence, Exercise Discussed, Exercise Reviewed, Physical Activity, Weight Managment  [Encouraged]  Physical Activity Discussed/Reviewed Home Exercise Program (HEP), Physical Activity Discussed, Physical Activity Reviewed, Types of exercise  [Encouraged]  Weight Management Weight maintenance  [Encouraged]  Education Interventions   Education Provided Provided Therapist, sports, Provided Web-based Education, Provided Education  [Encouraged]  Provided Verbal Education On Blood Sugar Monitoring, Medication, Nutrition, Mental Health/Coping with Illness, When to see the doctor, Sick Day Rules, Foot Care, Eye Care, [employer], Air traffic controller, Exercise, [employer], Insurance Plans  [Encouraged]  Labs Reviewed Hgb A1c  [Encouraged]  Applications Medicaid, Personal Care Services, FL-2  [Encouraged]  Mental Health Interventions   Mental Health Discussed/Reviewed Mental Health Discussed, Anxiety, Depression, Grief and Loss, Substance Abuse, Mental Health Reviewed, Coping Strategies, Crisis, Other, Suicide  [Domestic Violence]  Nutrition Interventions   Nutrition Discussed/Reviewed Nutrition Discussed, Adding fruits and vegetables, Increasing proteins, Decreasing fats, Decreasing salt, Supplemental nutrition, Decreasing sugar intake, Portion sizes, Carbohydrate meal planning, Fluid intake, Nutrition Reviewed  [Encouraged]  Pharmacy Interventions   Pharmacy Dicussed/Reviewed Pharmacy Topics Discussed, Medications and their functions, Medication Adherence, Pharmacy Topics Reviewed, Affording Medications  [Encouraged]  Safety Interventions   Safety Discussed/Reviewed Safety Discussed, Safety Reviewed, Fall Risk, Home Safety  [Encouraged]  Home Safety Assistive Devices, Need for home safety assessment, Refer for home visit, Refer for community resources, Contact provider for referral to PT/OT, Contact home health agency  [Encouraged]  Advanced Directive Interventions   Advanced Directives Discussed/Reviewed  Advanced Directives Discussed  [Encouraged]  End of Life Palliative  [Encouraged]      Active Listening & Reflection Utilized.  Verbalization of Feelings Encouraged.  Emotional Support Provided. Problem Solving Interventions Implemented. Task-Centered Solutions Employed.  Solution-Focused Strategies Activated. Acceptance & Commitment Therapy Initiated. Cognitive Behavioral Therapy Conducted. Client-Centered Therapy Performed. Encouraged Routine Engagement in Activities of Interest,  Inside & Outside the Home. Encouraged to Continue to Monitor Daily Weight & Administer Torsemide 20 MG, PO, PRN for 3 to 5 Pound Weight Gain.  Encouraged Contact with Representative at [facility] for [employer] [phone]), to Inquire About Obtaining Incontinence Supplies Covered Under Medicaid. Encouraged Contact with Representative at [facility] (# [phone]), to Inquire About Obtaining Incontinence Supplies Covered Under Medicaid. Encouraged Contact with Representative at [employer] [phone]), to Inquire About Obtaining Incontinence Supplies Covered Under Medicaid. Encouraged Attendance at Follow-Up Appointment with [facility] (# [phone]) to [facility], Scheduled on 11/22/2022 at 10:15 AM. Encouraged Contact with CSW (# [phone]), if You Have Questions, Need Assistance, or If Additional Social Work Needs Are Identified Between Now & Our Next Follow-Up Outreach Call, Scheduled on 11/28/2022 at 3:30 PM. Encouraged Attendance at Follow-Up Appointment with Rojelio Brenner, Physician Assistant with [facility] at [facility] [phone]), Scheduled on 11/29/2022 at 10:00 AM. Encouraged Attendance at Follow-Up Appointment with Dr. Dwana Melena, Primary Care Provider in [place], [place] [phone]), Scheduled on 01/03/2023 at 9:00 AM. Encouraged Attendance at Follow-Up Appointment  with Dr. Dwana Melena, Primary Care Provider in [place], [place] [phone]), Scheduled on 01/09/2023 at 3:20 PM.      Our next appointment is by telephone on 11/28/2022 at 3:30 pm.  Please call the care guide team at [phone] if you need to cancel or reschedule your appointment.   If you are experiencing a Mental Health or Behavioral Health Crisis or need someone to talk to, please call the Suicide and Crisis Lifeline: 988 call the [place] National Suicide Prevention Lifeline: (346)570-4879 or TTY: (540)750-8680 TTY (347) 866-0856) to talk to a trained counselor call 1-800-273-TALK (toll free, 24 hour hotline) go to [facility] [address] [place] [phone]) call the [facility] Crisis Line: [phone] call 911  Patient verbalizes understanding of instructions and care plan provided today and agrees to view in MyChart. Active MyChart status and patient understanding of how to access instructions and care plan via MyChart confirmed with patient.     Telephone follow up appointment with care management team member scheduled for:  11/28/2022 at 3:30 pm.  Danford Bad, BSW, MSW, LCSW  Embedded Practice Social Work Case Manager  [facility], Population Health Direct Dial: [phone]  Fax: [phone] Email: [email or web address]: [email or web address]

## 2022-10-26 NOTE — Patient Outreach (Signed)
Care Coordination   Follow Up Visit Note   10/26/2022  Name: Michael [name] MRN: [id number] DOB: [birth year]  Michael [name] is a 67 y.o. year old male who sees [name], [name], MD for primary care. I spoke with friend/landlord, [name] by phone today.  What matters to the patients health and wellness today?  Receive Assistance Completing Medicaid Application & Pursuing Higher Level of Care Options.   Goals Addressed               This Visit's Progress     Receive Assistance Completing Medicaid Application & Pursuing Higher Level of Care Options. (pt-stated)   On track     Care Coordination Interventions:  Interventions Today    Flowsheet Row Most Recent Value  Chronic Disease   Chronic disease during today's visit Chronic Obstructive Pulmonary Disease (COPD), Congestive Heart Failure (CHF), Chronic Kidney Disease/End Stage Renal Disease (ESRD), Hypertension (HTN), Other  [Inability to Perform Activities of Daily Living Independently]  General Interventions   General Interventions Discussed/Reviewed General Interventions Discussed, Labs, Vaccines, Doctor Visits, Communication with, Level of Care, [employer], Horticulturist, commercial (DME), Lipid Profile, Annual Foot Exam, Health Screening, General Interventions Reviewed, Annual Eye Exam  [Encouraged]  Labs Hgb A1c every 3 months, Kidney Function  [Encouraged]  Vaccines Tetanus/Pertussis/Diphtheria, Shingles, RSV, Pneumonia, Flu, COVID-19  [Encouraged]  Doctor Visits Discussed/Reviewed Doctor Visits Discussed, Specialist, Doctor Visits Reviewed, Annual Wellness Visits, PCP  [Encouraged]  Health Screening Bone Density, Colonoscopy, Prostate  [Encouraged]  Durable Medical Equipment (DME) Wheelchair  [Encouraged]  Wheelchair Standard  [Encouraged]  PCP/Specialist Visits Compliance with follow-up visit  [Encouraged]  Communication with PCP/Specialists, RN  [Encouraged]  Level of Care Adult Daycare, Personal Care Services,  Applications, Assisted Living, Skilled Nursing Facility  [Encouraged]  Applications Medicaid, Personal Care Services, [place]  [Encouraged]  Exercise Interventions   Exercise Discussed/Reviewed Assistive device use and maintanence, Exercise Discussed, Exercise Reviewed, Physical Activity, Weight Managment  [Encouraged]  Physical Activity Discussed/Reviewed Home Exercise Program (HEP), Physical Activity Discussed, Physical Activity Reviewed, Types of exercise  [Encouraged]  Weight Management Weight maintenance  [Encouraged]  Education Interventions   Education Provided Provided Therapist, sports, Provided Web-based Education, Provided Education  [Encouraged]  Provided Verbal Education On Blood Sugar Monitoring, Medication, Nutrition, Mental Health/Coping with Illness, When to see the doctor, Sick Day Rules, Foot Care, Eye Care, [employer], Air traffic controller, Exercise, [employer], Insurance Plans  [Encouraged]  Labs Reviewed Hgb A1c  [Encouraged]  Applications Medicaid, Personal Care Services, [place]  [Encouraged]  Mental Health Interventions   Mental Health Discussed/Reviewed Mental Health Discussed, Anxiety, Depression, Grief and Loss, Substance Abuse, Mental Health Reviewed, Coping Strategies, Crisis, Other, Suicide  [Domestic Violence]  Nutrition Interventions   Nutrition Discussed/Reviewed Nutrition Discussed, Adding fruits and vegetables, Increasing proteins, Decreasing fats, Decreasing salt, Supplemental nutrition, Decreasing sugar intake, Portion sizes, Carbohydrate meal planning, Fluid intake, Nutrition Reviewed  [Encouraged]  Pharmacy Interventions   Pharmacy Dicussed/Reviewed Pharmacy Topics Discussed, Medications and their functions, Medication Adherence, Pharmacy Topics Reviewed, Affording Medications  [Encouraged]  Safety Interventions   Safety Discussed/Reviewed Safety Discussed, Safety Reviewed, Fall Risk, Home Safety  [Encouraged]  Home Safety Assistive Devices, Need for home safety  assessment, Refer for home visit, Refer for community resources, Contact provider for referral to PT/OT, Contact home health agency  [Encouraged]  Advanced Directive Interventions   Advanced Directives Discussed/Reviewed Advanced Directives Discussed  [Encouraged]  End of Life Palliative  [Encouraged]      Active Listening & Reflection Utilized.  Verbalization of Feelings Encouraged.  Emotional Support Provided. Problem Solving Interventions Implemented. Task-Centered Solutions Employed.  Solution-Focused Strategies Activated. Acceptance & Commitment Therapy Initiated. Cognitive Behavioral Therapy Conducted. Client-Centered Therapy Performed. Encouraged Routine Engagement in Activities of Interest, Inside & Outside the Home. Encouraged to Continue to Monitor Daily Weight & Administer Torsemide 20 MG, PO, PRN for 3 to 5 Pound Weight Gain.  Encouraged Contact with Representative at [facility] for [employer] [phone]), to Inquire About Obtaining Incontinence Supplies Covered Under Medicaid. Encouraged Contact with Representative at [facility] (# [phone]), to Inquire About Obtaining Incontinence Supplies Covered Under Medicaid. Encouraged Contact with Representative at [employer] [phone]), to Inquire About Obtaining Incontinence Supplies Covered Under Medicaid. Encouraged Attendance at Follow-Up Appointment with [facility] (# [phone]) to [facility], Scheduled on 11/22/2022 at 10:15 AM. Encouraged Contact with CSW (# [phone]), if You Have Questions, Need Assistance, or If Additional Social Work Needs Are Identified Between Now & Our Next Follow-Up Outreach Call, Scheduled on 11/28/2022 at 3:30 PM. Encouraged Attendance at Follow-Up Appointment with Rojelio Brenner, Physician Assistant with [facility] at [facility] [phone]), Scheduled on 11/29/2022 at  10:00 AM. Encouraged Attendance at Follow-Up Appointment with Dr. Dwana Melena, Primary Care Provider in [place], [place] [phone]), Scheduled on 01/03/2023 at 9:00 AM. Encouraged Attendance at Follow-Up Appointment with Dr. Dwana Melena, Primary Care Provider in [place], [place] [phone]), Scheduled on 01/09/2023 at 3:20 PM.      SDOH assessments and interventions completed:  Yes.  Care Coordination Interventions:  Yes, provided.   Follow up plan: Follow up call scheduled for 11/28/2022 at 3:30 pm.  Encounter Outcome:  Patient Visit Completed.   Danford Bad, BSW, MSW, Printmaker Social Work Case Set designer Health  [facility], Population Health Direct Dial: [phone]  Fax: [phone] Email: [email or web address]

## 2022-11-04 ENCOUNTER — Observation Stay (HOSPITAL_COMMUNITY)
Admission: EM | Admit: 2022-11-04 | Discharge: 2022-11-05 | Discharge status: Against Medical Advice | Payer: 59 | Attending: Emergency Medicine | Admitting: Emergency Medicine

## 2022-11-04 ENCOUNTER — Emergency Department (HOSPITAL_COMMUNITY): Payer: 59

## 2022-11-04 ENCOUNTER — Other Ambulatory Visit: Payer: Self-pay

## 2022-11-04 ENCOUNTER — Encounter (HOSPITAL_COMMUNITY): Payer: Self-pay

## 2022-11-04 DIAGNOSIS — J449 Chronic obstructive pulmonary disease, unspecified: Secondary | ICD-10-CM | POA: Diagnosis not present

## 2022-11-04 DIAGNOSIS — I2510 Atherosclerotic heart disease of native coronary artery without angina pectoris: Secondary | ICD-10-CM | POA: Insufficient documentation

## 2022-11-04 DIAGNOSIS — D500 Iron deficiency anemia secondary to blood loss (chronic): Secondary | ICD-10-CM | POA: Diagnosis not present

## 2022-11-04 DIAGNOSIS — R55 Syncope and collapse: Secondary | ICD-10-CM | POA: Diagnosis not present

## 2022-11-04 DIAGNOSIS — E876 Hypokalemia: Secondary | ICD-10-CM | POA: Diagnosis present

## 2022-11-04 DIAGNOSIS — N189 Chronic kidney disease, unspecified: Secondary | ICD-10-CM

## 2022-11-04 DIAGNOSIS — N1832 Chronic kidney disease, stage 3b: Secondary | ICD-10-CM | POA: Insufficient documentation

## 2022-11-04 DIAGNOSIS — I517 Cardiomegaly: Secondary | ICD-10-CM | POA: Diagnosis not present

## 2022-11-04 DIAGNOSIS — E782 Mixed hyperlipidemia: Secondary | ICD-10-CM | POA: Diagnosis not present

## 2022-11-04 DIAGNOSIS — Z87891 Personal history of nicotine dependence: Secondary | ICD-10-CM | POA: Insufficient documentation

## 2022-11-04 DIAGNOSIS — I10 Essential (primary) hypertension: Secondary | ICD-10-CM | POA: Diagnosis present

## 2022-11-04 DIAGNOSIS — I130 Hypertensive heart and chronic kidney disease with heart failure and stage 1 through stage 4 chronic kidney disease, or unspecified chronic kidney disease: Secondary | ICD-10-CM | POA: Diagnosis not present

## 2022-11-04 DIAGNOSIS — R7989 Other specified abnormal findings of blood chemistry: Secondary | ICD-10-CM | POA: Diagnosis not present

## 2022-11-04 DIAGNOSIS — I509 Heart failure, unspecified: Secondary | ICD-10-CM | POA: Diagnosis not present

## 2022-11-04 DIAGNOSIS — Z79899 Other long term (current) drug therapy: Secondary | ICD-10-CM | POA: Insufficient documentation

## 2022-11-04 DIAGNOSIS — Z7902 Long term (current) use of antithrombotics/antiplatelets: Secondary | ICD-10-CM | POA: Insufficient documentation

## 2022-11-04 DIAGNOSIS — R778 Other specified abnormalities of plasma proteins: Secondary | ICD-10-CM | POA: Insufficient documentation

## 2022-11-04 DIAGNOSIS — Z720 Tobacco use: Secondary | ICD-10-CM | POA: Diagnosis present

## 2022-11-04 DIAGNOSIS — N183 Chronic kidney disease, stage 3 unspecified: Secondary | ICD-10-CM | POA: Diagnosis present

## 2022-11-04 DIAGNOSIS — I5042 Chronic combined systolic (congestive) and diastolic (congestive) heart failure: Secondary | ICD-10-CM | POA: Insufficient documentation

## 2022-11-04 DIAGNOSIS — R0602 Shortness of breath: Secondary | ICD-10-CM | POA: Diagnosis not present

## 2022-11-04 DIAGNOSIS — R918 Other nonspecific abnormal finding of lung field: Secondary | ICD-10-CM | POA: Diagnosis not present

## 2022-11-04 DIAGNOSIS — I429 Cardiomyopathy, unspecified: Secondary | ICD-10-CM

## 2022-11-04 DIAGNOSIS — F17200 Nicotine dependence, unspecified, uncomplicated: Secondary | ICD-10-CM | POA: Diagnosis present

## 2022-11-04 LAB — CBC
HCT: 38.9 % — ABNORMAL LOW (ref 39.0–52.0)
Hemoglobin: 11.9 g/dL — ABNORMAL LOW (ref 13.0–17.0)
MCH: 28.3 pg (ref 26.0–34.0)
MCHC: 30.6 g/dL (ref 30.0–36.0)
MCV: 92.4 fL (ref 80.0–100.0)
Platelets: 304 10*3/uL (ref 150–400)
RBC: 4.21 MIL/uL — ABNORMAL LOW (ref 4.22–5.81)
RDW: 15.9 % — ABNORMAL HIGH (ref 11.5–15.5)
WBC: 7.1 10*3/uL (ref 4.0–10.5)
nRBC: 0 % (ref 0.0–0.2)

## 2022-11-04 LAB — BRAIN NATRIURETIC PEPTIDE: B Natriuretic Peptide: >4500 pg/mL — ABNORMAL HIGH (ref 0.0–100.0)

## 2022-11-04 LAB — MAGNESIUM: Magnesium: 2.2 mg/dL (ref 1.7–2.4)

## 2022-11-04 LAB — BASIC METABOLIC PANEL
Anion gap: 10 (ref 5–15)
BUN: 31 mg/dL — ABNORMAL HIGH (ref 8–23)
CO2: 25 mmol/L (ref 22–32)
Calcium: 9.1 mg/dL (ref 8.9–10.3)
Chloride: 106 mmol/L (ref 98–111)
Creatinine, Ser: 2.07 mg/dL — ABNORMAL HIGH (ref 0.61–1.24)
GFR, Estimated: 34 mL/min — ABNORMAL LOW (ref >60)
Glucose, Bld: 93 mg/dL (ref 70–99)
Potassium: 3.4 mmol/L — ABNORMAL LOW (ref 3.5–5.1)
Sodium: 141 mmol/L (ref 135–145)

## 2022-11-04 LAB — TROPONIN I (HIGH SENSITIVITY)
Troponin I (High Sensitivity): 92 ng/L — ABNORMAL HIGH (ref <18)
Troponin I (High Sensitivity): 96 ng/L — ABNORMAL HIGH (ref <18)

## 2022-11-04 LAB — CBG MONITORING, ED: Glucose-Capillary: 110 mg/dL — ABNORMAL HIGH (ref 70–99)

## 2022-11-04 MED ORDER — UMECLIDINIUM BROMIDE 62.5 MCG/ACT IN AEPB
1.0000 | INHALATION_SPRAY | Freq: Every day | RESPIRATORY_TRACT | Status: DC
  Filled 2022-11-04: qty 7

## 2022-11-04 MED ORDER — SODIUM CHLORIDE 0.9 % IV BOLUS: 500.0000 mL | Freq: Once | INTRAVENOUS | Status: DC

## 2022-11-04 MED ORDER — PROCHLORPERAZINE EDISYLATE 10 MG/2ML IJ SOLN: 10.0000 mg | Freq: Four times a day (QID) | INTRAMUSCULAR | Status: DC | PRN

## 2022-11-04 MED ORDER — ALBUTEROL SULFATE (2.5 MG/3ML) 0.083% IN NEBU
3.0000 mL | INHALATION_SOLUTION | Freq: Four times a day (QID) | RESPIRATORY_TRACT | Status: DC | PRN
  Administered 2022-11-05: 3 mL via RESPIRATORY_TRACT
  Filled 2022-11-04: qty 3

## 2022-11-04 MED ORDER — ATORVASTATIN CALCIUM 40 MG PO TABS: 40.0000 mg | ORAL_TABLET | Freq: Every evening | ORAL | Status: DC

## 2022-11-04 MED ORDER — ENOXAPARIN SODIUM 40 MG/0.4ML IJ SOSY
40.0000 mg | PREFILLED_SYRINGE | INTRAMUSCULAR | Status: DC
  Administered 2022-11-05: 40 mg via SUBCUTANEOUS
  Filled 2022-11-04: qty 0.4

## 2022-11-04 MED ORDER — FUROSEMIDE 10 MG/ML IJ SOLN
40.0000 mg | Freq: Once | INTRAMUSCULAR | Status: AC
  Administered 2022-11-04: 40 mg via INTRAVENOUS
  Filled 2022-11-04: qty 4

## 2022-11-04 MED ORDER — HYDRALAZINE HCL 20 MG/ML IJ SOLN
10.0000 mg | Freq: Four times a day (QID) | INTRAMUSCULAR | Status: DC | PRN
  Administered 2022-11-04: 10 mg via INTRAVENOUS
  Filled 2022-11-04: qty 1

## 2022-11-04 MED ORDER — CARVEDILOL 12.5 MG PO TABS
12.5000 mg | ORAL_TABLET | Freq: Two times a day (BID) | ORAL | Status: DC
  Administered 2022-11-05: 12.5 mg via ORAL
  Filled 2022-11-04: qty 1

## 2022-11-04 MED ORDER — POTASSIUM CHLORIDE CRYS ER 20 MEQ PO TBCR
40.0000 meq | EXTENDED_RELEASE_TABLET | Freq: Once | ORAL | Status: AC
  Administered 2022-11-04: 40 meq via ORAL
  Filled 2022-11-04: qty 2

## 2022-11-04 MED ORDER — ACETAMINOPHEN 650 MG RE SUPP: 650.0000 mg | Freq: Four times a day (QID) | RECTAL | Status: DC | PRN

## 2022-11-04 MED ORDER — LOSARTAN POTASSIUM 25 MG PO TABS
25.0000 mg | ORAL_TABLET | Freq: Every day | ORAL | Status: DC
  Administered 2022-11-04 – 2022-11-05 (×2): 25 mg via ORAL
  Filled 2022-11-04 (×2): qty 1

## 2022-11-04 MED ORDER — CLOPIDOGREL BISULFATE 75 MG PO TABS
75.0000 mg | ORAL_TABLET | Freq: Every day | ORAL | Status: DC
  Administered 2022-11-04 – 2022-11-05 (×2): 75 mg via ORAL
  Filled 2022-11-04 (×2): qty 1

## 2022-11-04 MED ORDER — BUDESON-GLYCOPYRROL-FORMOTEROL 160-9-4.8 MCG/ACT IN AERO: 2.0000 | INHALATION_SPRAY | Freq: Two times a day (BID) | RESPIRATORY_TRACT | Status: DC

## 2022-11-04 MED ORDER — MOMETASONE FURO-FORMOTEROL FUM 100-5 MCG/ACT IN AERO
2.0000 | INHALATION_SPRAY | Freq: Two times a day (BID) | RESPIRATORY_TRACT | Status: DC
  Administered 2022-11-04 – 2022-11-05 (×2): 2 via RESPIRATORY_TRACT
  Filled 2022-11-04: qty 8.8

## 2022-11-04 MED ORDER — ACETAMINOPHEN 325 MG PO TABS: 650.0000 mg | ORAL_TABLET | Freq: Four times a day (QID) | ORAL | Status: DC | PRN

## 2022-11-04 MED ORDER — TORSEMIDE 20 MG PO TABS
20.0000 mg | ORAL_TABLET | Freq: Two times a day (BID) | ORAL | Status: DC
  Administered 2022-11-05: 20 mg via ORAL
  Filled 2022-11-04: qty 1

## 2022-11-04 NOTE — ED Provider Notes (Signed)
[place] EMERGENCY DEPARTMENT AT [facility] Provider Note   CSN: [id number] Arrival date & time: 11/04/22  1601     History {Add pertinent medical, surgical, social history, OB history to HPI:1} Chief Complaint  Patient presents with   Loss of Consciousness    Michael [name] is a 67 y.o. male.  He is here for evaluation of near syncope x 2 today.  He said it occurred while walking in his house.  Felt lightheaded and was able to lower himself down to the floor.  He does not think he fully passed out.  He denies any injury from it.  He also endorses some chronic shortness of breath.  He said this has been going on for few years.  He continues to smoke but is down to 3 cigarettes a day and does not use oxygen.  He denies any fevers or chills no chest pain no sputum production no vomiting or diarrhea.  He thinks he has been eating and drinking okay.  He denies any injury from passing out.  No chest pain.  He had a prior skin cancer surgery on his face years ago that is slowly healing.  The history is provided by the patient.  Near Syncope This is a new problem. The current episode started 1 to 2 hours ago. Associated symptoms include shortness of breath. Pertinent negatives include no chest pain, no abdominal pain and no headaches. Nothing aggravates the symptoms. Nothing relieves the symptoms. He has tried nothing for the symptoms. The treatment provided no relief.       Home Medications Prior to Admission medications   Medication Sig Start Date End Date Taking? Authorizing Provider  acetaminophen (TYLENOL) 500 MG tablet Take 1,000 mg by mouth daily as needed for fever, headache or moderate pain.    [provider]  albuterol (VENTOLIN HFA) 108 (90 Base) MCG/ACT inhaler Inhale 2 puffs into the lungs every 6 (six) hours as needed for wheezing or shortness of breath. 04/27/22   Shahmehdi, Gemma Payor, MD  atorvastatin (LIPITOR) 40 MG tablet Take 1 tablet (40 mg total) by mouth  every evening. 08/01/22 08/31/22  Shahmehdi, Gemma Payor, MD  Budeson-Glycopyrrol-Formoterol (BREZTRI AEROSPHERE) 160-9-4.8 MCG/ACT AERO Inhale 2 puffs into the lungs 2 (two) times daily.    [provider]  carvedilol (COREG) 12.5 MG tablet Take 1 tablet (12.5 mg total) by mouth 2 (two) times daily with a meal. 08/18/22 11/16/22  Shahmehdi, Gemma Payor, MD  clopidogrel (PLAVIX) 75 MG tablet Take 1 tablet (75 mg total) by mouth daily. 05/13/22   Strader, Lennart Pall, PA-C  losartan (COZAAR) 25 MG tablet Take 1 tablet (25 mg total) by mouth daily. 08/19/22 09/18/22  Shahmehdi, Gemma Payor, MD  potassium chloride SA (KLOR-CON M) 20 MEQ tablet Take 1 tablet (20 mEq total) by mouth daily. 07/26/22 08/25/22  Shahmehdi, Gemma Payor, MD  torsemide (DEMADEX) 20 MG tablet Take 1 tablet (20 mg total) by mouth 2 (two) times daily. 08/18/22 11/16/22  Kendell Bane, MD      Allergies    Patient has no known allergies.    Review of Systems   Review of Systems  Constitutional:  Negative for fever.  Eyes:  Negative for visual disturbance.  Respiratory:  Positive for shortness of breath.   Cardiovascular:  Positive for near-syncope. Negative for chest pain.  Gastrointestinal:  Negative for abdominal pain.  Neurological:  Negative for headaches.    Physical Exam Updated Vital Signs BP (!) 156/142 (BP Location:  Left Arm)   Pulse 88   Temp 98.3 F (36.8 C)   Resp (!) 23   Ht 6' (1.829 m)   Wt 64.9 kg   SpO2 95%   BMI 19.39 kg/m  Physical Exam Vitals and nursing note reviewed.  Constitutional:      General: He is not in acute distress.    Appearance: Normal appearance. He is well-developed.  HENT:     Head: Normocephalic and atraumatic.     Comments: He has some postsurgical changes across to his forehead and nose and right cheek Eyes:     Conjunctiva/sclera: Conjunctivae normal.  Cardiovascular:     Rate and Rhythm: Normal rate and regular rhythm.     Heart sounds: No murmur heard. Pulmonary:      Effort: Tachypnea and accessory muscle usage present. No respiratory distress.     Breath sounds: Normal breath sounds.  Abdominal:     Palpations: Abdomen is soft.     Tenderness: There is no abdominal tenderness. There is no guarding or rebound.  Musculoskeletal:        General: Deformity (Mass left upper arm, been there for 5 years) present. No swelling.     Cervical back: Neck supple.  Skin:    General: Skin is warm and dry.     Capillary Refill: Capillary refill takes less than 2 seconds.  Neurological:     General: No focal deficit present.     Mental Status: He is alert.     Sensory: No sensory deficit.     Motor: No weakness.     ED Results / Procedures / Treatments   Labs (all labs ordered are listed, but only abnormal results are displayed) Labs Reviewed  CBC - Abnormal; Notable for the following components:      Result Value   RBC 4.21 (*)    Hemoglobin 11.9 (*)    HCT 38.9 (*)    RDW 15.9 (*)    All other components within normal limits  CBG MONITORING, ED - Abnormal; Notable for the following components:   Glucose-Capillary 110 (*)    All other components within normal limits  BASIC METABOLIC PANEL    EKG EKG Interpretation Date/Time:  Friday November 04 2022 16:14:13 EDT Ventricular Rate:  87 PR Interval:  256 QRS Duration:  129 QT Interval:  404 QTC Calculation: 486 R Axis:   32  Text Interpretation: Sinus or ectopic atrial rhythm Multiple premature complexes, vent & supraven Prolonged PR interval LVH with secondary repolarization abnormality Anterior infarct, old increased ectopy from prior 7/24 Confirmed by Meridee Score [phone]) on 11/04/2022 4:30:55 PM  Radiology No results found.  Procedures Procedures  {Document cardiac monitor, telemetry assessment procedure when appropriate:1}  Medications Ordered in ED Medications  sodium chloride 0.9 % bolus 500 mL (has no administration in time range)    ED Course/ Medical Decision Making/ A&P   {    Click here for ABCD2, HEART and other calculatorsREFRESH Note before signing :1}                              Medical Decision Making Amount and/or Complexity of Data Reviewed Labs: ordered. Radiology: ordered.   This patient complains of ***; this involves an extensive number of treatment Options and is a complaint that carries with it a high risk of complications and morbidity. The differential includes ***  I ordered, reviewed and interpreted labs, which included ***  I ordered medication *** and reviewed PMP when indicated. I ordered imaging studies which included *** and I independently    visualized and interpreted imaging which showed *** Additional history obtained from *** Previous records obtained and reviewed *** I consulted *** and discussed lab and imaging findings and discussed disposition.  Cardiac monitoring reviewed, *** Social determinants considered, *** Critical Interventions: ***  After the interventions stated above, I reevaluated the patient and found *** Admission and further testing considered, ***   {Document critical care time when appropriate:1} {Document review of labs and clinical decision tools ie heart score, Chads2Vasc2 etc:1}  {Document your independent review of radiology images, and any outside records:1} {Document your discussion with family members, caretakers, and with consultants:1} {Document social determinants of health affecting pt's care:1} {Document your decision making why or why not admission, treatments were needed:1} Final Clinical Impression(s) / ED Diagnoses Final diagnoses:  None    Rx / DC Orders ED Discharge Orders     None

## 2022-11-04 NOTE — H&P (Signed)
History and Physical    Patient: Michael [name] VHQ:[id number] DOB: 1955-03-11 DOA: 11/04/2022 DOS: the patient was seen and examined on 11/04/2022 PCP: Benita Stabile, MD  Patient coming from: Home  Chief Complaint:  Chief Complaint  Patient presents with   Loss of Consciousness   HPI: Michael [name] is a 67 y.o. male with medical history significant of hypertension, HFrEF (LVEF on echocardiogram in July 2024 was 25%), COPD, CAD, CKD stage IIIb, hypertension, tobacco abuse who presents to the emergency from home via EMS due to 2 episodes of near syncope sustained at home.  Patient complained of feeling lightheaded while walking in the house, so he lowered himself down to the floor without passing out or sustaining any injury.  Patient complained of several year onset of chronic shortness of breath, he does not use oxygen at baseline, but he continues to smoke cigarettes which is now down to 2 cigarettes/day.  He denies chest pain, headache, fever, chills, vomiting or diarrhea. Patient was recently admitted from 7/15 to 7/18 due to acute exacerbation of combined systolic and diastolic CHF, repeated echo on 7/16 showed EF 25% (similar to prior).  Cardiology was consulted, patient was discharged with torsemide, Coreg and losartan.  ED Course:  In the emergency department, he was tachypneic, BP was 177/111, other vital signs were within normal range.  Workup in the ED showed normocytic anemia, BMP was normal except for potassium of 3.4, BUN/creatinine 31/2.07 (baseline creatinine at 1.7-2.0), BNP > 4,5000 (chronic), Troponin 92 > 96, magnesium 2.2 Chest x-ray showed chronic cardiomegaly, but no acute findings. IV Lasix 40 mg x 1 was given, potassium was replenished. Hospitalist was asked to admit patient for further evaluation and management.  Review of Systems: Review of systems as noted in the HPI. All other systems reviewed and are negative.   Past Medical History:  Diagnosis Date   Aortic  atherosclerosis (HCC) 08/03/2021   CAD (coronary artery disease) 08/03/2021   Cardiac catheterization September 2023 with RCA/RV marginal stenosis managed medically   Cardiomyopathy (HCC)    Carotid artery disease (HCC)    CKD (chronic kidney disease) stage 3, GFR 30-59 ml/min (HCC)    COPD (chronic obstructive pulmonary disease) (HCC)    Essential hypertension    Head and neck cancer (HCC) 2019   Right facial basal cell carcinoma s/p resection with right partial mastectomy and partal rhinectomy with skin graft (06/13/17)   Iron deficiency anemia    Urinary retention    Past Surgical History:  Procedure Laterality Date   ANKLE CLOSED REDUCTION Right    open reduction   BASAL CELL CARCINOMA EXCISION  2019   at [facility]   BIOPSY  07/19/2021   Procedure: BIOPSY;  Surgeon: Lanelle Bal, DO;  Location: [facility];  Service: Endoscopy;;   COLONOSCOPY N/A 05/31/2017   Procedure: COLONOSCOPY;  Surgeon: Corbin Ade, MD;  Location: [facility];  Service: Endoscopy;  Laterality: N/A;  2:45pm   COLONOSCOPY WITH PROPOFOL N/A 07/19/2021   Procedure: COLONOSCOPY WITH PROPOFOL;  Surgeon: Lanelle Bal, DO;  Location: [facility];  Service: Endoscopy;  Laterality: N/A;  3:00pm, moved up to 9:00   CYSTOSCOPY N/A 10/29/2018   Procedure: CYSTOSCOPY, CLOT EVACUATION;  Surgeon: Rene Paci, MD;  Location: [facility];  Service: Urology;  Laterality: N/A;   PARTIAL COLECTOMY Right 08/11/2021   Procedure: PARTIAL COLECTOMY, OPEN RIGHT HEMICOLECTOMY;  Surgeon: Lucretia Roers, MD;  Location: [facility];  Service: General;  Laterality: Right;  POLYPECTOMY  05/31/2017   Procedure: POLYPECTOMY;  Surgeon: Corbin Ade, MD;  Location: [facility];  Service: Endoscopy;;   RIGHT/LEFT HEART CATH AND CORONARY ANGIOGRAPHY N/A 10/22/2021   Procedure: RIGHT/LEFT HEART CATH AND CORONARY ANGIOGRAPHY;  Surgeon: Orbie Pyo, MD;  Location: [facility];  Service: Cardiovascular;  Laterality:  N/A;   TONSILLECTOMY     TRANSCAROTID ARTERY REVASCULARIZATION  Right 02/15/2021   Procedure: RIGHT TRANSCAROTID ARTERY REVASCULARIZATION;  Surgeon: Cephus Shelling, MD;  Location: [facility] OR;  Service: Vascular;  Laterality: Right;   ULTRASOUND GUIDANCE FOR VASCULAR ACCESS Left 02/15/2021   Procedure: ULTRASOUND GUIDANCE FOR VASCULAR ACCESS;  Surgeon: Cephus Shelling, MD;  Location: [facility] OR;  Service: Vascular;  Laterality: Left;   XI ROBOTIC ASSISTED SIMPLE PROSTATECTOMY N/A 10/29/2018   Procedure: XI ROBOTIC ASSISTED SIMPLE PROSTATECTOMY;  Surgeon: Malen Gauze, MD;  Location: [facility];  Service: Urology;  Laterality: N/A;    Social History:  reports that he quit smoking about 16 months ago. His smoking use included cigarettes. He started smoking about 41 years ago. He has a 20 pack-year smoking history. He has been exposed to tobacco smoke. He has never used smokeless tobacco. He reports that he does not currently use alcohol. He reports that he does not use drugs.   No Known Allergies  Family History  Problem Relation Age of Onset   Stroke Father    Cirrhosis Mother    Colon cancer Neg Hx      Prior to Admission medications   Medication Sig Start Date End Date Taking? Authorizing Provider  acetaminophen (TYLENOL) 500 MG tablet Take 1,000 mg by mouth daily as needed for fever, headache or moderate pain.    [provider]  albuterol (VENTOLIN HFA) 108 (90 Base) MCG/ACT inhaler Inhale 2 puffs into the lungs every 6 (six) hours as needed for wheezing or shortness of breath. 04/27/22   Shahmehdi, Gemma Payor, MD  atorvastatin (LIPITOR) 40 MG tablet Take 1 tablet (40 mg total) by mouth every evening. 08/01/22 08/31/22  Shahmehdi, Gemma Payor, MD  Budeson-Glycopyrrol-Formoterol (BREZTRI AEROSPHERE) 160-9-4.8 MCG/ACT AERO Inhale 2 puffs into the lungs 2 (two) times daily.    [provider]  carvedilol (COREG) 12.5 MG tablet Take 1 tablet (12.5 mg total) by mouth 2 (two) times  daily with a meal. 08/18/22 11/16/22  Shahmehdi, Gemma Payor, MD  clopidogrel (PLAVIX) 75 MG tablet Take 1 tablet (75 mg total) by mouth daily. 05/13/22   Strader, Lennart Pall, PA-C  losartan (COZAAR) 25 MG tablet Take 1 tablet (25 mg total) by mouth daily. 08/19/22 09/18/22  Shahmehdi, Gemma Payor, MD  potassium chloride SA (KLOR-CON M) 20 MEQ tablet Take 1 tablet (20 mEq total) by mouth daily. 07/26/22 08/25/22  Shahmehdi, Gemma Payor, MD  torsemide (DEMADEX) 20 MG tablet Take 1 tablet (20 mg total) by mouth 2 (two) times daily. 08/18/22 11/16/22  Kendell Bane, MD    Physical Exam: BP (!) 143/88   Pulse 84   Temp 98.8 F (37.1 C)   Resp 14   Ht 6' (1.829 m)   Wt 64.9 kg   SpO2 97%   BMI 19.39 kg/m   General: 67 y.o. year-old male well developed well nourished in no acute distress.  Alert and oriented x3. HEENT: NCAT, EOMI. Reconstructed nasal/reconstructed nasal and facial folds  Neck: Supple, trachea medial Cardiovascular: Regular rate and rhythm with no rubs or gallops.  No thyromegaly or JVD noted.  No lower  extremity edema. 2/4 pulses in all 4 extremities. Respiratory: Tachypnea.  Clear to auscultation with no wheezes or rales. Abdomen: Soft, nontender nondistended with normal bowel sounds x4 quadrants. Muskuloskeletal: Noted cyst in the back.  No cyanosis, clubbing or edema noted bilaterally Neuro: CN II-XII intact, strength 5/5 x 4, sensation, reflexes intact Skin: No ulcerative lesions noted or rashes Psychiatry: Judgement and insight appear normal. Mood is appropriate for condition and setting          Labs on Admission:  Basic Metabolic Panel: Recent Labs  Lab 11/04/22 1626  NA 141  K 3.4*  CL 106  CO2 25  GLUCOSE 93  BUN 31*  CREATININE 2.07*  CALCIUM 9.1  MG 2.2   Liver Function Tests: No results for input(s): "AST", "ALT", "ALKPHOS", "BILITOT", "PROT", "ALBUMIN" in the last 168 hours. No results for input(s): "LIPASE", "AMYLASE" in the last 168 hours. No results for  input(s): "AMMONIA" in the last 168 hours. CBC: Recent Labs  Lab 11/04/22 1626  WBC 7.1  HGB 11.9*  HCT 38.9*  MCV 92.4  PLT 304   Cardiac Enzymes: No results for input(s): "CKTOTAL", "CKMB", "CKMBINDEX", "TROPONINI" in the last 168 hours.  BNP (last 3 results) Recent Labs    07/28/22 1800 08/15/22 1513 11/04/22 1626  BNP >4,500.0* >4,500.0* >4,500.0*    ProBNP (last 3 results) No results for input(s): "PROBNP" in the last 8760 hours.  CBG: Recent Labs  Lab 11/04/22 1625  GLUCAP 110*    Radiological Exams on Admission: DG Chest Port 1 View  Result Date: 11/04/2022 CLINICAL DATA:  Syncope.  Shortness of breath. EXAM: PORTABLE CHEST 1 VIEW COMPARISON:  08/15/2022 FINDINGS: Chronic cardiomegaly. Stable mediastinal contours. Previous lower lobe opacities have resolved. Chronic interstitial coarsening. No convincing pulmonary edema. No pneumothorax. No large pleural effusion. IMPRESSION: 1. Chronic cardiomegaly. 2. Previous bilateral airspace opacities have resolved. No acute findings. Electronically Signed   By: Narda Rutherford M.D.   On: 11/04/2022 19:05    EKG: I independently viewed the EKG done and my findings are as followed: Sinus or ectopic atrial rhythms at a rate of 87 bpm with multiple premature complexes, supraventricular and ventricular.  Prolonged PR interval of 256 ms and LVH with secondary repolarization abnormality  Assessment/Plan Present on Admission:  Near syncope  Chronic combined systolic and diastolic CHF (congestive heart failure) (HCC)  Hypokalemia  Elevated troponin  Elevated brain natriuretic peptide (BNP) level  Essential hypertension  Mixed hyperlipidemia  CAD (coronary artery disease)  Tobacco use disorder  Chronic kidney disease, stage 3b (HCC)  COPD (chronic obstructive pulmonary disease) (HCC)  Principal Problem:   Near syncope Active Problems:   Hypokalemia   Essential hypertension   Tobacco use disorder   Elevated troponin    Chronic combined systolic and diastolic CHF (congestive heart failure) (HCC)   CAD (coronary artery disease)   Chronic kidney disease, stage 3b (HCC)   Elevated brain natriuretic peptide (BNP) level   COPD (chronic obstructive pulmonary disease) (HCC)   Mixed hyperlipidemia  Near Syncope Continue telemetry and watch for arrhythmias Troponins 92 > 96 ; patient denies chest pain.   EKG personally reviewed notes and also ectopic atrial rhythms at a rate of 87 bpm with multiple premature complexes, supraventricular and ventricular.  Prolonged PR interval of 256 ms and LVH with secondary repolarization abnormality Echocardiogram done on 08/16/2022 showed LVEF of 25%.  LV demonstrates global hypokinesis.  No LVH LV diastolic function cannot be evaluated Echocardiogram will be done to rule  out significant aortic stenosis or other outflow obstruction, and also to evaluate EF and to rule out segmental/Regional wall motion abnormalities.  Carotid artery Dopplers will be done to rule out hemodynamically significant stenosis  Chronic combined HFrEF and HFpEF Chronic elevated BNP (>4,500) Echocardiogram done on 08/16/2022 showed LVEF of 25% Echocardiogram done in June 2024 showed LVEF of 24 to 30%.  LV with global hypokinesis.  Mild LVH.  G3 DD Continue total input/output, daily weights and fluid restriction Continue heart healthy diet   Hypokalemia K+ 3.4, this was replenished  Elevated troponin possibly due to type II demand ischemia Troponin 92 > 96.  This is chronically elevated and has ranged within 73-93 in the last 3 months. Patient denies any chest pain Continue to monitor  H/o CAD--LHC from 10/20/2021 with mild to moderate CAD He denies chest pain Continue home carvedilol, atorvastatin and Plavix   CKD stage -3B    BUN/creatinine 31/2.07 (baseline creatinine at 1.7-2.0) Renally adjust medications, avoid nephrotoxic agents/dehydration/hypotension  COPD (not in acute  exacerbation) Continue Ventolin, budesonideide-glycopyrrolate-formoterol  Essential hypertension Continue Coreg Continue IV hydralazine 10 mg every 6 hours as needed for SBP > 170  Mixed hyperlipidemia Continue Lipitor  Tobacco use disorder Patient states that he has reduced cigarette smoking to 2 cigarettes daily Patient was counseled on tobacco use cessation  Goals of care: Palliative care will be consulted  DVT prophylaxis: Lovenox  Advance Care Planning: CODE STATUS: DNR  Consults: None  Family Communication: None at bedside  Severity of Illness: The appropriate patient status for this patient is OBSERVATION. Observation status is judged to be reasonable and necessary in order to provide the required intensity of service to ensure the patient's safety. The patient's presenting symptoms, physical exam findings, and initial radiographic and laboratory data in the context of their medical condition is felt to place them at decreased risk for further clinical deterioration. Furthermore, it is anticipated that the patient will be medically stable for discharge from the hospital within 2 midnights of admission.   Author: Frankey Shown, DO 11/04/2022 10:05 PM  For on call review [email or web address].

## 2022-11-04 NOTE — ED Triage Notes (Signed)
Pt reports he has been feeling light headed and dizziness today. Reports he had 2 episodes of passing out today, states he was able to assist himself to the floor each time. No injury. Pt is AxOx4. Pt reports chronic sob. Hx of copd.

## 2022-11-05 ENCOUNTER — Observation Stay (HOSPITAL_COMMUNITY): Payer: 59

## 2022-11-05 ENCOUNTER — Observation Stay (HOSPITAL_BASED_OUTPATIENT_CLINIC_OR_DEPARTMENT_OTHER): Payer: 59

## 2022-11-05 DIAGNOSIS — Z79899 Other long term (current) drug therapy: Secondary | ICD-10-CM | POA: Diagnosis not present

## 2022-11-05 DIAGNOSIS — I6523 Occlusion and stenosis of bilateral carotid arteries: Secondary | ICD-10-CM | POA: Diagnosis not present

## 2022-11-05 DIAGNOSIS — R778 Other specified abnormalities of plasma proteins: Secondary | ICD-10-CM | POA: Diagnosis not present

## 2022-11-05 DIAGNOSIS — N1832 Chronic kidney disease, stage 3b: Secondary | ICD-10-CM | POA: Diagnosis not present

## 2022-11-05 DIAGNOSIS — E876 Hypokalemia: Secondary | ICD-10-CM | POA: Diagnosis not present

## 2022-11-05 DIAGNOSIS — D500 Iron deficiency anemia secondary to blood loss (chronic): Secondary | ICD-10-CM | POA: Diagnosis not present

## 2022-11-05 DIAGNOSIS — R55 Syncope and collapse: Secondary | ICD-10-CM

## 2022-11-05 DIAGNOSIS — I5042 Chronic combined systolic (congestive) and diastolic (congestive) heart failure: Secondary | ICD-10-CM | POA: Diagnosis not present

## 2022-11-05 DIAGNOSIS — I2510 Atherosclerotic heart disease of native coronary artery without angina pectoris: Secondary | ICD-10-CM | POA: Diagnosis not present

## 2022-11-05 DIAGNOSIS — J449 Chronic obstructive pulmonary disease, unspecified: Secondary | ICD-10-CM | POA: Diagnosis not present

## 2022-11-05 DIAGNOSIS — Z7902 Long term (current) use of antithrombotics/antiplatelets: Secondary | ICD-10-CM | POA: Diagnosis not present

## 2022-11-05 DIAGNOSIS — I10 Essential (primary) hypertension: Secondary | ICD-10-CM | POA: Diagnosis not present

## 2022-11-05 DIAGNOSIS — Z87891 Personal history of nicotine dependence: Secondary | ICD-10-CM | POA: Diagnosis not present

## 2022-11-05 DIAGNOSIS — I130 Hypertensive heart and chronic kidney disease with heart failure and stage 1 through stage 4 chronic kidney disease, or unspecified chronic kidney disease: Secondary | ICD-10-CM | POA: Diagnosis not present

## 2022-11-05 LAB — ECHOCARDIOGRAM LIMITED
Est EF: 25
Height: 72 in
S' Lateral: 6.5 cm
Weight: 2304 [oz_av]

## 2022-11-05 LAB — CBC
HCT: 36.6 % — ABNORMAL LOW (ref 39.0–52.0)
Hemoglobin: 11.4 g/dL — ABNORMAL LOW (ref 13.0–17.0)
MCH: 28.6 pg (ref 26.0–34.0)
MCHC: 31.1 g/dL (ref 30.0–36.0)
MCV: 92 fL (ref 80.0–100.0)
Platelets: 253 10*3/uL (ref 150–400)
RBC: 3.98 MIL/uL — ABNORMAL LOW (ref 4.22–5.81)
RDW: 15.9 % — ABNORMAL HIGH (ref 11.5–15.5)
WBC: 6.3 10*3/uL (ref 4.0–10.5)
nRBC: 0 % (ref 0.0–0.2)

## 2022-11-05 LAB — HIV ANTIBODY (ROUTINE TESTING W REFLEX): HIV Screen 4th Generation wRfx: NONREACTIVE

## 2022-11-05 LAB — COMPREHENSIVE METABOLIC PANEL
ALT: 27 U/L (ref 0–44)
AST: 31 U/L (ref 15–41)
Albumin: 3.5 g/dL (ref 3.5–5.0)
Alkaline Phosphatase: 137 U/L — ABNORMAL HIGH (ref 38–126)
Anion gap: 10 (ref 5–15)
BUN: 31 mg/dL — ABNORMAL HIGH (ref 8–23)
CO2: 23 mmol/L (ref 22–32)
Calcium: 8.8 mg/dL — ABNORMAL LOW (ref 8.9–10.3)
Chloride: 108 mmol/L (ref 98–111)
Creatinine, Ser: 2.04 mg/dL — ABNORMAL HIGH (ref 0.61–1.24)
GFR, Estimated: 35 mL/min — ABNORMAL LOW (ref >60)
Glucose, Bld: 156 mg/dL — ABNORMAL HIGH (ref 70–99)
Potassium: 3.5 mmol/L (ref 3.5–5.1)
Sodium: 141 mmol/L (ref 135–145)
Total Bilirubin: 2.4 mg/dL — ABNORMAL HIGH (ref 0.3–1.2)
Total Protein: 6.6 g/dL (ref 6.5–8.1)

## 2022-11-05 LAB — MAGNESIUM: Magnesium: 1.9 mg/dL (ref 1.7–2.4)

## 2022-11-05 LAB — PHOSPHORUS: Phosphorus: 3.3 mg/dL (ref 2.5–4.6)

## 2022-11-05 NOTE — Progress Notes (Signed)
Pt has been up to nurse's station multiple times asking to leave. Advised pt each time of tests that were ordered and awaiting completion or awaiting over read for results by physician. Pt reluctantly agreed to stay for tests and results. Currently, pt states he is leaving at 3pm, he has called friend to come pick him up, he is tired of waiting around. MD Dayna Barker notified of pt's statement of leaving and advises that pt may leave AMA as his treatment is not completed at this time.

## 2022-11-05 NOTE — Care Management Obs Status (Signed)
MEDICARE OBSERVATION STATUS NOTIFICATION   Patient Details  Name: Michael [name] MRN: [id number] Date of Birth: 04-29-1955   Medicare Observation Status Notification Given:  Yes    Akyra Bouchie A Joyann Spidle, RN 11/05/2022, 2:09 PM

## 2022-11-05 NOTE — Plan of Care (Signed)
Pt alert and oriented x 4. UP as tolerated refused bedalarm. Gait steady when ambulating. Urinal placed at bedside to decrease risk fall with going to BR. Fall mat placed. No prns given. Vitals stable.  Problem: Education: Goal: Knowledge of condition and prescribed therapy will improve Outcome: Progressing   Problem: Cardiac: Goal: Will achieve and/or maintain adequate cardiac output Outcome: Progressing   Problem: Physical Regulation: Goal: Complications related to the disease process, condition or treatment will be avoided or minimized Outcome: Progressing   Problem: Education: Goal: Knowledge of General Education information will improve Description: Including pain rating scale, medication(s)/side effects and non-pharmacologic comfort measures Outcome: Progressing   Problem: Health Behavior/Discharge Planning: Goal: Ability to manage health-related needs will improve Outcome: Progressing   Problem: Clinical Measurements: Goal: Ability to maintain clinical measurements within normal limits will improve Outcome: Progressing Goal: Will remain free from infection Outcome: Progressing Goal: Diagnostic test results will improve Outcome: Progressing Goal: Respiratory complications will improve Outcome: Progressing Goal: Cardiovascular complication will be avoided Outcome: Progressing   Problem: Activity: Goal: Risk for activity intolerance will decrease Outcome: Progressing   Problem: Nutrition: Goal: Adequate nutrition will be maintained Outcome: Progressing   Problem: Coping: Goal: Level of anxiety will decrease Outcome: Progressing   Problem: Elimination: Goal: Will not experience complications related to bowel motility Outcome: Progressing Goal: Will not experience complications related to urinary retention Outcome: Progressing   Problem: Pain Managment: Goal: General experience of comfort will improve Outcome: Progressing   Problem: Safety: Goal: Ability to  remain free from injury will improve Outcome: Progressing   Problem: Skin Integrity: Goal: Risk for impaired skin integrity will decrease Outcome: Progressing

## 2022-11-05 NOTE — Evaluation (Signed)
Physical Therapy Evaluation Patient Details Name: Michael [name] MRN: [id number] DOB: Nov 21, 1955 Today's Date: 11/05/2022  History of Present Illness  Per MV:HQIO Michael [name] is a 67 y.o. male with medical history significant of hypertension, HFrEF (LVEF on echocardiogram in July 2024 was 25%), COPD, CAD, CKD stage IIIb, hypertension, tobacco abuse who presents to the emergency from home via EMS due to 2 episodes of near syncope sustained at home.  Patient complained of feeling lightheaded while walking in the house, so he lowered himself down to the floor without passing out or sustaining any injury.  Patient complained of several year onset of chronic shortness of breath, he does not use oxygen at baseline, but he continues to smoke cigarettes which is now down to 2 cigarettes/day.  He denies chest pain, headache, fever, chills, vomiting or diarrhea.  Clinical Impression  PT states he does not need PT but will willingly walk.   Pt had two loss of balance which he was able to self correct.  Therapist suggested a cane, however, pt states he would not use one.  PT O2 after walking 95; HR 83        If plan is discharge home, recommend the following: Assistance with cooking/housework;Assist for transportation;Help with stairs or ramp for entrance   Can travel by private vehicle    yes    Equipment Recommendations None recommended by PT  Recommendations for Other Services    none   Functional Status Assessment Patient has not had a recent decline in their functional status     Precautions / Restrictions Precautions Precautions: None Restrictions Weight Bearing Restrictions: No      Mobility  Bed Mobility Overal bed mobility: Independent                  Transfers Overall transfer level: Independent                      Ambulation/Gait Ambulation/Gait assistance: Independent Gait Distance (Feet): 200 Feet Assistive device: None     Gait velocity interpretation:  1.31 - 2.62 ft/sec, indicative of limited community ambulator   General Gait Details: Pt lost balance twice but able to self recover.  Therapist recommended a cane, however, pt stated that he would not use it.       Pertinent Vitals/Pain Pain Assessment Pain Assessment: No/denies pain    Home Living Family/patient expects to be discharged to:: Private residence Living Arrangements: Non-relatives/Friends Available Help at Discharge: Friend(s);Available PRN/intermittently Type of Home: House Home Access: Ramped entrance       Home Layout: One level Home Equipment: None      Prior Function Prior Level of Function : Independent/Modified Independent             Mobility Comments: Household and short distanced community ambulator without use of an AD, does not drive ADLs Comments: Independent     Extremity/Trunk Assessment        Lower Extremity Assessment Lower Extremity Assessment: Overall WFL for tasks assessed       Communication   Communication Communication: No apparent difficulties Cueing Techniques: Verbal cues  Cognition Arousal: Alert Behavior During Therapy: WFL for tasks assessed/performed Overall Cognitive Status: Within Functional Limits for tasks assessed  Assessment/Plan    PT Assessment Patient does not need any further PT services  PT Problem List  none       PT Treatment Interventions   none   PT Goals (Current goals can be found in the Care Plan section)  Acute Rehab PT Goals Patient Stated Goal: to go home PT Goal Formulation: With patient Time For Goal Achievement: 11/05/22            AM-PAC PT "6 Clicks" Mobility  Outcome Measure Help needed turning from your back to your side while in a flat bed without using bedrails?: None Help needed moving from lying on your back to sitting on the side of a flat bed without using bedrails?: None Help needed moving to  and from a bed to a chair (including a wheelchair)?: None Help needed standing up from a chair using your arms (e.g., wheelchair or bedside chair)?: None Help needed to walk in hospital room?: None Help needed climbing 3-5 steps with a railing? : A Little 6 Click Score: 23    End of Session Equipment Utilized During Treatment: Gait belt Activity Tolerance: Patient tolerated treatment well Patient left: Other (comment) (dressed and sitting on edge of bed as theapist found him.) Nurse Communication: Mobility status      Time: 5284-1324 PT Time Calculation (min) (ACUTE ONLY): 15 min   Charges:   PT Evaluation $PT Eval Low Complexity: 1 Low   PT General Charges $$ ACUTE PT VISIT: 1 Visit        Virgina Organ, PT CLT [phone]  11/05/2022, 11:41 AM

## 2022-11-05 NOTE — Discharge Summary (Incomplete)
PROGRESS NOTE Michael [name]  UUV:[id number] DOB: 17-Sep-1955 DOA: 11/04/2022 PCP: Benita Stabile, MD  Brief Narrative/Hospital Course: 67 y.o.m w/ HTN,HFrEF W/ EF 25% in 08/2022, COPD, CAD, CKD stage IIIb,HTN, tobacco abuse recent admission 7/15 to 7/18 due to acute exacerbation of combined systolic and diastolic CHF presented to the ED due to  2 episodes of near syncope.Patient complained of feeling lightheaded while walking in the house, so he lowered himself down to the floor without passing out or sustaining any injury.Patient complained of several year onset of chronic shortness of breath, he does not use oxygen at baseline, but he continues to smoke cigarettes In KV:[id number], BP-177/111, otherwise stable vital signs.  Labs>normocytic anemia, k 3.4, BUN/creatinine 31/2.07 (baseline creatinine at 1.7-2.0),BNP > 4,5000 (chronic), Troponin 92 > 96, magnesium 2.2 CXR>>chronic cardiomegaly, but no acute findings Patient is given IV Lasix, potassium and admission requested for further management.       Subjective: Patient was seen and examined in the morning he was doing well He reports that he will be leaving today regardless for his discharge are not   Assessment and Plan: Principal Problem:   Near syncope Active Problems:   Hypokalemia   Essential hypertension   Tobacco use disorder   Elevated troponin   Chronic combined systolic and diastolic CHF (congestive heart failure) (HCC)   CAD (coronary artery disease)   Chronic kidney disease, stage 3b (HCC)   Elevated brain natriuretic peptide (BNP) level   COPD (chronic obstructive pulmonary disease) (HCC)   Mixed hyperlipidemia   Near syncope: Unclear etiology.  Chest x-ray and clinical exam without evidence of volume overload, troponin elevated but flat-without delta and EKG nonischemic, PR interval prolonged.  Last echo with low EF of 25% in July, [facility].  Monitoring on telemetry, follow-up with repeat echo, carotid Doppler. Check  orthostatic vitals and will also consulted cardiology.  Chronic combined CHF: EF of 25% BNP elevated more than 4500 which is chronic, follow-up echo, continue to monitor input output Daily weight and keep on fluid restriction and heart healthy diet.  GDMT with home Coreg, losartan and torsemide.  Hypokalemia: Replaced  h/o CAD--LHC from 10/20/2021 with mild to moderate CAD HLD HTN: No chest pain, troponin flat. Continue home carvedilol, atorvastatin and Plavix  Elevated troponin in the setting of demand ischemia from CHF: Troponin flat.  CKD stage IIIb: On admit 31/2.07 (baseline creatinine at 1.7-2.0).  Monitor  COPD Tobacco abuse: Continue home inhaler, encouraged smoking cessation.  DVT prophylaxis:  Code Status:   Code Status: Prior Family Communication: plan of care discussed with patient  at bedside. Patient status is: Admitted as observation because of pending workup Level of care: Telemetry   Dispo: The patient is from: Home            Anticipated disposition: TBD Objective: Vitals last 24 hrs: Vitals:   11/05/22 0811 11/05/22 0842 11/05/22 0952 11/05/22 1200  BP: (!) 152/66  (!) 167/104 126/87  Pulse: 90  85 68  Resp: 18   17  Temp:    97.9 F (36.6 C)  TempSrc:      SpO2: 91% 91%  96%  Weight:      Height:       Weight change:   Physical Examination: General exam: alert awake, older than stated age HEENT:Oral mucosa moist, Ear/Nose WNL grossly Respiratory system: bilaterally clear BS, no use of accessory muscle Cardiovascular system: S1 & S2 +, No JVD. Gastrointestinal system: Abdomen soft,NT,ND, BS+ Nervous System:Alert, awake, moving  extremities. Extremities: LE edema neg,distal peripheral pulses palpable.  Skin: No rashes,no icterus. MSK: Normal muscle bulk,tone, power  Medications reviewed:  Scheduled Meds: Continuous Infusions:    Diet Order     None               No intake or output data in the 24 hours ending 11/07/22 1420 Net  IO Since Admission: -350 mL [11/07/22 1420]  Wt Readings from Last 3 Encounters:  11/05/22 65.3 kg  08/18/22 61.1 kg  08/15/22 65.6 kg     Unresulted Labs (From admission, onward)    None     Data Reviewed: I have personally reviewed following labs and imaging studies CBC: Recent Labs  Lab 11/04/22 1626 11/05/22 0631  WBC 7.1 6.3  HGB 11.9* 11.4*  HCT 38.9* 36.6*  MCV 92.4 92.0  PLT 304 253   Basic Metabolic Panel: Recent Labs  Lab 11/04/22 1626 11/05/22 0631  NA 141 141  K 3.4* 3.5  CL 106 108  CO2 25 23  GLUCOSE 93 156*  BUN 31* 31*  CREATININE 2.07* 2.04*  CALCIUM 9.1 8.8*  MG 2.2 1.9  PHOS  --  3.3   GFR: Estimated Creatinine Clearance: 32 mL/min (A) (by C-G formula based on SCr of 2.04 mg/dL (H)). Liver Function Tests: Recent Labs  Lab 11/05/22 0631  AST 31  ALT 27  ALKPHOS 137*  BILITOT 2.4*  PROT 6.6  ALBUMIN 3.5   No results for input(s): "LIPASE", "AMYLASE" in the last 168 hours. No results for input(s): "AMMONIA" in the last 168 hours. Coagulation Profile: No results for input(s): "INR", "PROTIME" in the last 168 hours. BNP (last 3 results) No results for input(s): "PROBNP" in the last 8760 hours. HbA1C: No results for input(s): "HGBA1C" in the last 72 hours. CBG: Recent Labs  Lab 11/04/22 1625  GLUCAP 110*   Lipid Profile: No results for input(s): "CHOL", "HDL", "LDLCALC", "TRIG", "CHOLHDL", "LDLDIRECT" in the last 72 hours. Thyroid Function Tests: No results for input(s): "TSH", "T4TOTAL", "FREET4", "T3FREE", "THYROIDAB" in the last 72 hours. Sepsis Labs: No results for input(s): "PROCALCITON", "LATICACIDVEN" in the last 168 hours.  No results found for this or any previous visit (from the past 240 hour(s)).  Antimicrobials: Anti-infectives (From admission, onward)    None      Culture/Microbiology    Component Value Date/Time   SDES  07/29/2022 0855    LEFT ANTECUBITAL BOTTLES DRAWN AEROBIC AND ANAEROBIC   SDES   07/29/2022 0855    BLOOD LEFT HAND BOTTLES DRAWN AEROBIC AND ANAEROBIC   SPECREQUEST Blood Culture adequate volume 07/29/2022 0855   SPECREQUEST Blood Culture adequate volume 07/29/2022 0855   CULT  07/29/2022 0855    NO GROWTH 5 DAYS Performed at [facility], [address] [place], [place] [address]    CULT  07/29/2022 0855    NO GROWTH 5 DAYS Performed at [facility], [address] [place], [place] [address]    REPTSTATUS 08/03/2022 FINAL 07/29/2022 0855   REPTSTATUS 08/03/2022 FINAL 07/29/2022 0855    Other culture-see note  Radiology Studies: No results found.   LOS: 0 days   Lanae Boast, MD [facility]  11/07/2022, 2:20 PM

## 2022-11-05 NOTE — Hospital Course (Signed)
67 y.o.m w/ HTN,HFrEF W/ EF 25% in 08/2022, COPD, CAD, CKD stage IIIb,HTN, tobacco abuse recent admission 7/15 to 7/18 due to acute exacerbation of combined systolic and diastolic CHF presented to the ED due to  2 episodes of near syncope.Patient complained of feeling lightheaded while walking in the house, so he lowered himself down to the floor without passing out or sustaining any injury.Patient complained of several year onset of chronic shortness of breath, he does not use oxygen at baseline, but he continues to smoke cigarettes In OZ:[id number], BP-177/111, otherwise stable vital signs.  Labs>normocytic anemia, k 3.4, BUN/creatinine 31/2.07 (baseline creatinine at 1.7-2.0),BNP > 4,5000 (chronic), Troponin 92 > 96, magnesium 2.2 CXR>>chronic cardiomegaly, but no acute findings Patient is given IV Lasix, potassium and admission requested for further management.  Near syncope: Unclear etiology.  Chest x-ray and clinical exam without evidence of volume overload, troponin elevated but flat-without delta and EKG nonischemic, PR interval prolonged.  Last echo with low EF of 25% in July, [facility].  Monitoring on telemetry, follow-up with repeat echo, carotid Doppler. Check orthostatic vitals and will also consult cardiology.  Chronic combined CHF: EF of 25% BNP elevated more than 4500 which is chronic, follow-up echo, continue to monitor input output Daily weight and keep on fluid restriction and heart healthy diet.  GDMT with home Coreg, losartan and torsemide.  Hypokalemia: Replaced  h/o CAD--LHC from 10/20/2021 with mild to moderate CAD HLD HTN: No chest pain, troponin flat. Continue home carvedilol, atorvastatin and Plavix  Elevated troponin in the setting of demand ischemia from CHF: Troponin flat.  CKD stage IIIb: On admit 31/2.07 (baseline creatinine at 1.7-2.0).  Monitor  COPD Tobacco abuse: Continue home inhaler, encouraged smoking cessation.

## 2022-11-05 NOTE — Progress Notes (Signed)
Pt notified about the pending status of discharge due to test result interpretation. Pt adamant about leaving. Risks of leaving AMA explained to the patient and the pt stated his understanding. PIV removed. Pt signed AMA paperwork. Pt escorted to the elevators. MD Ramesh notified via [facility] page.

## 2022-11-07 NOTE — Discharge Summary (Signed)
AMA  Patient at this time expresses desire to leave the Hospital immidiately, patient has been warned that this is not Medically advisable at this time, and can result in Medical complications like Death and Disability, patient understands and accepts the risks involved and assumes full responsibilty of this decision. I made my best effort to convince him to stay.  Can you left the hospital before tests were interpreted to decide his disposition Echocardiogram and carotid duplex results are not available at the time when he left AGAINST MEDICAL ADVICE, patient was instructed to follow-up with his PCP for the results and for further plan if any abnormality there   Lanae Boast M.D on 11/07/2022 at 2:24 PM  [facility]  Time < 30 minutes  Last Note Below   PROGRESS NOTE Michael [name]  ZOX:[id number] DOB: November 03, 1955 DOA: 11/04/2022 PCP: Benita Stabile, MD  Brief Narrative/Hospital Course: 67 y.o.m w/ HTN,HFrEF W/ EF 25% in 08/2022, COPD, CAD, CKD stage IIIb,HTN, tobacco abuse recent admission 7/15 to 7/18 due to acute exacerbation of combined systolic and diastolic CHF presented to the ED due to  2 episodes of near syncope.Patient complained of feeling lightheaded while walking in the house, so he lowered himself down to the floor without passing out or sustaining any injury.Patient complained of several year onset of chronic shortness of breath, he does not use oxygen at baseline, but he continues to smoke cigarettes In WJ:[id number], BP-177/111, otherwise stable vital signs.  Labs>normocytic anemia, k 3.4, BUN/creatinine 31/2.07 (baseline creatinine at 1.7-2.0),BNP > 4,5000 (chronic), Troponin 92 > 96, magnesium 2.2 CXR>>chronic cardiomegaly, but no acute findings Patient is given IV Lasix, potassium and admission requested for further  management.       Subjective: Patient was seen and examined in the morning he was doing well He reports that he will be leaving today regardless for his discharge are not   Assessment and Plan: Principal Problem:   Near syncope Active Problems:   Hypokalemia   Essential hypertension   Tobacco use disorder   Elevated troponin   Chronic combined systolic and diastolic CHF (congestive heart failure) (HCC)   CAD (coronary artery disease)   Chronic kidney disease, stage 3b (HCC)   Elevated brain natriuretic peptide (BNP) level   COPD (chronic obstructive pulmonary disease) (HCC)   Mixed hyperlipidemia   Near syncope: Unclear etiology.  Chest x-ray and clinical exam without evidence of volume overload, troponin elevated but flat-without delta and EKG nonischemic, PR interval prolonged.  Last echo with low EF of 25% in July, [facility].  Monitoring on telemetry, follow-up with repeat echo, carotid Doppler. Check orthostatic vitals and will also consulted cardiology.  Chronic combined CHF: EF of 25% BNP elevated more than 4500 which is chronic, follow-up echo, continue to monitor input output Daily weight and keep on fluid restriction and heart healthy diet.  GDMT with home Coreg, losartan and torsemide.  Hypokalemia: Replaced  h/o CAD--LHC from 10/20/2021 with mild to moderate CAD HLD HTN: No chest pain, troponin flat. Continue home carvedilol, atorvastatin and Plavix  Elevated troponin in the setting of demand ischemia from CHF: Troponin flat.  CKD stage IIIb: On admit 31/2.07 (baseline creatinine  at 1.7-2.0).  Monitor  COPD Tobacco abuse: Continue home inhaler, encouraged smoking cessation.  DVT prophylaxis:  Code Status:   Code Status: Prior Family Communication: plan of care discussed with patient  at bedside. Patient status is: Admitted as observation because of pending workup Level of care: Telemetry   Dispo: The patient is from: Home            Anticipated disposition:  TBD Objective: Vitals last 24 hrs: Vitals:   11/05/22 0811 11/05/22 0842 11/05/22 0952 11/05/22 1200  BP: (!) 152/66  (!) 167/104 126/87  Pulse: 90  85 68  Resp: 18   17  Temp:    97.9 F (36.6 C)  TempSrc:      SpO2: 91% 91%  96%  Weight:      Height:       Weight change:   Physical Examination: General exam: alert awake, older than stated age HEENT:Oral mucosa moist, Ear/Nose WNL grossly Respiratory system: bilaterally clear BS, no use of accessory muscle Cardiovascular system: S1 & S2 +, No JVD. Gastrointestinal system: Abdomen soft,NT,ND, BS+ Nervous System:Alert, awake, moving extremities. Extremities: LE edema neg,distal peripheral pulses palpable.  Skin: No rashes,no icterus. MSK: Normal muscle bulk,tone, power  Medications reviewed:  Scheduled Meds: Continuous Infusions:    Diet Order     None               No intake or output data in the 24 hours ending 11/07/22 1420 Net IO Since Admission: -350 mL [11/07/22 1420]  Wt Readings from Last 3 Encounters:  11/05/22 65.3 kg  08/18/22 61.1 kg  08/15/22 65.6 kg     Unresulted Labs (From admission, onward)    None     Data Reviewed: I have personally reviewed following labs and imaging studies CBC: Recent Labs  Lab 11/04/22 1626 11/05/22 0631  WBC 7.1 6.3  HGB 11.9* 11.4*  HCT 38.9* 36.6*  MCV 92.4 92.0  PLT 304 253   Basic Metabolic Panel: Recent Labs  Lab 11/04/22 1626 11/05/22 0631  NA 141 141  K 3.4* 3.5  CL 106 108  CO2 25 23  GLUCOSE 93 156*  BUN 31* 31*  CREATININE 2.07* 2.04*  CALCIUM 9.1 8.8*  MG 2.2 1.9  PHOS  --  3.3   GFR: Estimated Creatinine Clearance: 32 mL/min (A) (by C-G formula based on SCr of 2.04 mg/dL (H)). Liver Function Tests: Recent Labs  Lab 11/05/22 0631  AST 31  ALT 27  ALKPHOS 137*  BILITOT 2.4*  PROT 6.6  ALBUMIN 3.5   No results for input(s): "LIPASE", "AMYLASE" in the last 168 hours. No results for input(s): "AMMONIA" in the last 168  hours. Coagulation Profile: No results for input(s): "INR", "PROTIME" in the last 168 hours. BNP (last 3 results) No results for input(s): "PROBNP" in the last 8760 hours. HbA1C: No results for input(s): "HGBA1C" in the last 72 hours. CBG: Recent Labs  Lab 11/04/22 1625  GLUCAP 110*   Lipid Profile: No results for input(s): "CHOL", "HDL", "LDLCALC", "TRIG", "CHOLHDL", "LDLDIRECT" in the last 72 hours. Thyroid Function Tests: No results for input(s): "TSH", "T4TOTAL", "FREET4", "T3FREE", "THYROIDAB" in the last 72 hours. Sepsis Labs: No results for input(s): "PROCALCITON", "LATICACIDVEN" in the last 168 hours.  No results found for this or any previous visit (from the past 240 hour(s)).  Antimicrobials: Anti-infectives (From admission, onward)    None      Culture/Microbiology    Component Value Date/Time   SDES  07/29/2022 0855    LEFT ANTECUBITAL BOTTLES DRAWN AEROBIC AND ANAEROBIC   SDES  07/29/2022 0855    BLOOD LEFT HAND BOTTLES DRAWN AEROBIC AND ANAEROBIC   SPECREQUEST Blood Culture adequate volume 07/29/2022 0855   SPECREQUEST Blood Culture adequate volume 07/29/2022 0855   CULT  07/29/2022 0855    NO GROWTH 5 DAYS Performed at [facility], [address], [place], [place] [address]    CULT  07/29/2022 0855    NO GROWTH 5 DAYS Performed at [facility], [address], [place], [place] [address]    REPTSTATUS 08/03/2022 FINAL 07/29/2022 0855   REPTSTATUS 08/03/2022 FINAL 07/29/2022 0855    Other culture-see note  Radiology Studies: No results found.   LOS: 0 days   Lanae Boast, MD [facility]  11/07/2022, 2:20 PM

## 2022-11-07 NOTE — Progress Notes (Signed)
PROGRESS NOTE Michael [name]  UUV:[id number] DOB: 17-Sep-1955 DOA: 11/04/2022 PCP: Benita Stabile, MD  Brief Narrative/Hospital Course: 67 y.o.m w/ HTN,HFrEF W/ EF 25% in 08/2022, COPD, CAD, CKD stage IIIb,HTN, tobacco abuse recent admission 7/15 to 7/18 due to acute exacerbation of combined systolic and diastolic CHF presented to the ED due to  2 episodes of near syncope.Patient complained of feeling lightheaded while walking in the house, so he lowered himself down to the floor without passing out or sustaining any injury.Patient complained of several year onset of chronic shortness of breath, he does not use oxygen at baseline, but he continues to smoke cigarettes In KV:[id number], BP-177/111, otherwise stable vital signs.  Labs>normocytic anemia, k 3.4, BUN/creatinine 31/2.07 (baseline creatinine at 1.7-2.0),BNP > 4,5000 (chronic), Troponin 92 > 96, magnesium 2.2 CXR>>chronic cardiomegaly, but no acute findings Patient is given IV Lasix, potassium and admission requested for further management.       Subjective: Patient was seen and examined in the morning he was doing well He reports that he will be leaving today regardless for his discharge are not   Assessment and Plan: Principal Problem:   Near syncope Active Problems:   Hypokalemia   Essential hypertension   Tobacco use disorder   Elevated troponin   Chronic combined systolic and diastolic CHF (congestive heart failure) (HCC)   CAD (coronary artery disease)   Chronic kidney disease, stage 3b (HCC)   Elevated brain natriuretic peptide (BNP) level   COPD (chronic obstructive pulmonary disease) (HCC)   Mixed hyperlipidemia   Near syncope: Unclear etiology.  Chest x-ray and clinical exam without evidence of volume overload, troponin elevated but flat-without delta and EKG nonischemic, PR interval prolonged.  Last echo with low EF of 25% in July, [facility].  Monitoring on telemetry, follow-up with repeat echo, carotid Doppler. Check  orthostatic vitals and will also consulted cardiology.  Chronic combined CHF: EF of 25% BNP elevated more than 4500 which is chronic, follow-up echo, continue to monitor input output Daily weight and keep on fluid restriction and heart healthy diet.  GDMT with home Coreg, losartan and torsemide.  Hypokalemia: Replaced  h/o CAD--LHC from 10/20/2021 with mild to moderate CAD HLD HTN: No chest pain, troponin flat. Continue home carvedilol, atorvastatin and Plavix  Elevated troponin in the setting of demand ischemia from CHF: Troponin flat.  CKD stage IIIb: On admit 31/2.07 (baseline creatinine at 1.7-2.0).  Monitor  COPD Tobacco abuse: Continue home inhaler, encouraged smoking cessation.  DVT prophylaxis:  Code Status:   Code Status: Prior Family Communication: plan of care discussed with patient  at bedside. Patient status is: Admitted as observation because of pending workup Level of care: Telemetry   Dispo: The patient is from: Home            Anticipated disposition: TBD Objective: Vitals last 24 hrs: Vitals:   11/05/22 0811 11/05/22 0842 11/05/22 0952 11/05/22 1200  BP: (!) 152/66  (!) 167/104 126/87  Pulse: 90  85 68  Resp: 18   17  Temp:    97.9 F (36.6 C)  TempSrc:      SpO2: 91% 91%  96%  Weight:      Height:       Weight change:   Physical Examination: General exam: alert awake, older than stated age HEENT:Oral mucosa moist, Ear/Nose WNL grossly Respiratory system: bilaterally clear BS, no use of accessory muscle Cardiovascular system: S1 & S2 +, No JVD. Gastrointestinal system: Abdomen soft,NT,ND, BS+ Nervous System:Alert, awake, moving  extremities. Extremities: LE edema neg,distal peripheral pulses palpable.  Skin: No rashes,no icterus. MSK: Normal muscle bulk,tone, power  Medications reviewed:  Scheduled Meds: Continuous Infusions:    Diet Order     None               No intake or output data in the 24 hours ending 11/07/22 1420 Net  IO Since Admission: -350 mL [11/07/22 1420]  Wt Readings from Last 3 Encounters:  11/05/22 65.3 kg  08/18/22 61.1 kg  08/15/22 65.6 kg     Unresulted Labs (From admission, onward)    None     Data Reviewed: I have personally reviewed following labs and imaging studies CBC: Recent Labs  Lab 11/04/22 1626 11/05/22 0631  WBC 7.1 6.3  HGB 11.9* 11.4*  HCT 38.9* 36.6*  MCV 92.4 92.0  PLT 304 253   Basic Metabolic Panel: Recent Labs  Lab 11/04/22 1626 11/05/22 0631  NA 141 141  K 3.4* 3.5  CL 106 108  CO2 25 23  GLUCOSE 93 156*  BUN 31* 31*  CREATININE 2.07* 2.04*  CALCIUM 9.1 8.8*  MG 2.2 1.9  PHOS  --  3.3   GFR: Estimated Creatinine Clearance: 32 mL/min (A) (by C-G formula based on SCr of 2.04 mg/dL (H)). Liver Function Tests: Recent Labs  Lab 11/05/22 0631  AST 31  ALT 27  ALKPHOS 137*  BILITOT 2.4*  PROT 6.6  ALBUMIN 3.5   No results for input(s): "LIPASE", "AMYLASE" in the last 168 hours. No results for input(s): "AMMONIA" in the last 168 hours. Coagulation Profile: No results for input(s): "INR", "PROTIME" in the last 168 hours. BNP (last 3 results) No results for input(s): "PROBNP" in the last 8760 hours. HbA1C: No results for input(s): "HGBA1C" in the last 72 hours. CBG: Recent Labs  Lab 11/04/22 1625  GLUCAP 110*   Lipid Profile: No results for input(s): "CHOL", "HDL", "LDLCALC", "TRIG", "CHOLHDL", "LDLDIRECT" in the last 72 hours. Thyroid Function Tests: No results for input(s): "TSH", "T4TOTAL", "FREET4", "T3FREE", "THYROIDAB" in the last 72 hours. Sepsis Labs: No results for input(s): "PROCALCITON", "LATICACIDVEN" in the last 168 hours.  No results found for this or any previous visit (from the past 240 hour(s)).  Antimicrobials: Anti-infectives (From admission, onward)    None      Culture/Microbiology    Component Value Date/Time   SDES  07/29/2022 0855    LEFT ANTECUBITAL BOTTLES DRAWN AEROBIC AND ANAEROBIC   SDES   07/29/2022 0855    BLOOD LEFT HAND BOTTLES DRAWN AEROBIC AND ANAEROBIC   SPECREQUEST Blood Culture adequate volume 07/29/2022 0855   SPECREQUEST Blood Culture adequate volume 07/29/2022 0855   CULT  07/29/2022 0855    NO GROWTH 5 DAYS Performed at [facility], [address] [place], [place] [address]    CULT  07/29/2022 0855    NO GROWTH 5 DAYS Performed at [facility], [address] [place], [place] [address]    REPTSTATUS 08/03/2022 FINAL 07/29/2022 0855   REPTSTATUS 08/03/2022 FINAL 07/29/2022 0855    Other culture-see note  Radiology Studies: No results found.   LOS: 0 days   Lanae Boast, MD [facility]  11/07/2022, 2:20 PM

## 2022-11-10 DIAGNOSIS — H25812 Combined forms of age-related cataract, left eye: Secondary | ICD-10-CM | POA: Diagnosis not present

## 2022-11-10 DIAGNOSIS — H01002 Unspecified blepharitis right lower eyelid: Secondary | ICD-10-CM | POA: Diagnosis not present

## 2022-11-10 DIAGNOSIS — H2521 Age-related cataract, morgagnian type, right eye: Secondary | ICD-10-CM | POA: Diagnosis not present

## 2022-11-10 DIAGNOSIS — H01001 Unspecified blepharitis right upper eyelid: Secondary | ICD-10-CM | POA: Diagnosis not present

## 2022-11-10 DIAGNOSIS — H16211 Exposure keratoconjunctivitis, right eye: Secondary | ICD-10-CM | POA: Diagnosis not present

## 2022-11-13 ENCOUNTER — Other Ambulatory Visit: Payer: Self-pay

## 2022-11-13 ENCOUNTER — Encounter (HOSPITAL_COMMUNITY): Payer: Self-pay

## 2022-11-13 ENCOUNTER — Observation Stay (HOSPITAL_COMMUNITY)
Admission: EM | Admit: 2022-11-13 | Discharge: 2022-11-15 | Disposition: A | Discharge status: Home / Self Care | Payer: 59 | Attending: Family Medicine | Admitting: Family Medicine

## 2022-11-13 ENCOUNTER — Observation Stay (HOSPITAL_COMMUNITY): Payer: 59

## 2022-11-13 DIAGNOSIS — N1832 Chronic kidney disease, stage 3b: Secondary | ICD-10-CM | POA: Diagnosis not present

## 2022-11-13 DIAGNOSIS — Z85828 Personal history of other malignant neoplasm of skin: Secondary | ICD-10-CM | POA: Insufficient documentation

## 2022-11-13 DIAGNOSIS — J449 Chronic obstructive pulmonary disease, unspecified: Secondary | ICD-10-CM | POA: Insufficient documentation

## 2022-11-13 DIAGNOSIS — Z87891 Personal history of nicotine dependence: Secondary | ICD-10-CM | POA: Insufficient documentation

## 2022-11-13 DIAGNOSIS — I2510 Atherosclerotic heart disease of native coronary artery without angina pectoris: Secondary | ICD-10-CM | POA: Insufficient documentation

## 2022-11-13 DIAGNOSIS — I129 Hypertensive chronic kidney disease with stage 1 through stage 4 chronic kidney disease, or unspecified chronic kidney disease: Secondary | ICD-10-CM | POA: Diagnosis not present

## 2022-11-13 DIAGNOSIS — R55 Syncope and collapse: Secondary | ICD-10-CM | POA: Diagnosis not present

## 2022-11-13 DIAGNOSIS — I5043 Acute on chronic combined systolic (congestive) and diastolic (congestive) heart failure: Secondary | ICD-10-CM | POA: Insufficient documentation

## 2022-11-13 DIAGNOSIS — E876 Hypokalemia: Secondary | ICD-10-CM | POA: Diagnosis not present

## 2022-11-13 DIAGNOSIS — N3000 Acute cystitis without hematuria: Secondary | ICD-10-CM

## 2022-11-13 DIAGNOSIS — R9431 Abnormal electrocardiogram [ECG] [EKG]: Secondary | ICD-10-CM | POA: Diagnosis not present

## 2022-11-13 DIAGNOSIS — N189 Chronic kidney disease, unspecified: Secondary | ICD-10-CM | POA: Diagnosis not present

## 2022-11-13 DIAGNOSIS — Z79899 Other long term (current) drug therapy: Secondary | ICD-10-CM | POA: Insufficient documentation

## 2022-11-13 DIAGNOSIS — R030 Elevated blood-pressure reading, without diagnosis of hypertension: Secondary | ICD-10-CM

## 2022-11-13 DIAGNOSIS — I428 Other cardiomyopathies: Secondary | ICD-10-CM

## 2022-11-13 DIAGNOSIS — F17200 Nicotine dependence, unspecified, uncomplicated: Secondary | ICD-10-CM | POA: Diagnosis not present

## 2022-11-13 DIAGNOSIS — N390 Urinary tract infection, site not specified: Secondary | ICD-10-CM | POA: Diagnosis not present

## 2022-11-13 DIAGNOSIS — Z7902 Long term (current) use of antithrombotics/antiplatelets: Secondary | ICD-10-CM | POA: Diagnosis not present

## 2022-11-13 DIAGNOSIS — S0990XA Unspecified injury of head, initial encounter: Secondary | ICD-10-CM | POA: Diagnosis not present

## 2022-11-13 DIAGNOSIS — Z720 Tobacco use: Secondary | ICD-10-CM | POA: Diagnosis present

## 2022-11-13 DIAGNOSIS — N183 Chronic kidney disease, stage 3 unspecified: Secondary | ICD-10-CM | POA: Diagnosis present

## 2022-11-13 LAB — URINALYSIS, ROUTINE W REFLEX MICROSCOPIC
Bilirubin Urine: NEGATIVE
Glucose, UA: NEGATIVE mg/dL
Ketones, ur: NEGATIVE mg/dL
Nitrite: NEGATIVE
Protein, ur: 100 mg/dL — AB
Specific Gravity, Urine: 1.017 (ref 1.005–1.030)
WBC, UA: >50 WBC/hpf (ref 0–5)
pH: 5 (ref 5.0–8.0)

## 2022-11-13 LAB — CBC
HCT: 38.5 % — ABNORMAL LOW (ref 39.0–52.0)
Hemoglobin: 11.6 g/dL — ABNORMAL LOW (ref 13.0–17.0)
MCH: 27.3 pg (ref 26.0–34.0)
MCHC: 30.1 g/dL (ref 30.0–36.0)
MCV: 90.6 fL (ref 80.0–100.0)
Platelets: 299 10*3/uL (ref 150–400)
RBC: 4.25 MIL/uL (ref 4.22–5.81)
RDW: 15.3 % (ref 11.5–15.5)
WBC: 7.3 10*3/uL (ref 4.0–10.5)
nRBC: 0 % (ref 0.0–0.2)

## 2022-11-13 LAB — BASIC METABOLIC PANEL
Anion gap: 7 (ref 5–15)
BUN: 27 mg/dL — ABNORMAL HIGH (ref 8–23)
CO2: 23 mmol/L (ref 22–32)
Calcium: 8.7 mg/dL — ABNORMAL LOW (ref 8.9–10.3)
Chloride: 108 mmol/L (ref 98–111)
Creatinine, Ser: 1.96 mg/dL — ABNORMAL HIGH (ref 0.61–1.24)
GFR, Estimated: 37 mL/min — ABNORMAL LOW (ref >60)
Glucose, Bld: 104 mg/dL — ABNORMAL HIGH (ref 70–99)
Potassium: 3.2 mmol/L — ABNORMAL LOW (ref 3.5–5.1)
Sodium: 138 mmol/L (ref 135–145)

## 2022-11-13 LAB — TROPONIN I (HIGH SENSITIVITY): Troponin I (High Sensitivity): 88 ng/L — ABNORMAL HIGH (ref <18)

## 2022-11-13 LAB — CBG MONITORING, ED: Glucose-Capillary: 99 mg/dL (ref 70–99)

## 2022-11-13 MED ORDER — POTASSIUM CHLORIDE CRYS ER 20 MEQ PO TBCR
40.0000 meq | EXTENDED_RELEASE_TABLET | Freq: Once | ORAL | Status: AC
  Administered 2022-11-13: 40 meq via ORAL
  Filled 2022-11-13: qty 2

## 2022-11-13 MED ORDER — MOMETASONE FURO-FORMOTEROL FUM 100-5 MCG/ACT IN AERO
2.0000 | INHALATION_SPRAY | Freq: Two times a day (BID) | RESPIRATORY_TRACT | Status: DC
  Administered 2022-11-14 – 2022-11-15 (×3): 2 via RESPIRATORY_TRACT
  Filled 2022-11-13: qty 8.8

## 2022-11-13 MED ORDER — UMECLIDINIUM BROMIDE 62.5 MCG/ACT IN AEPB
1.0000 | INHALATION_SPRAY | Freq: Every day | RESPIRATORY_TRACT | Status: DC
  Filled 2022-11-13: qty 7

## 2022-11-13 MED ORDER — ENSURE ENLIVE PO LIQD
237.0000 mL | Freq: Two times a day (BID) | ORAL | Status: DC
  Administered 2022-11-14 – 2022-11-15 (×2): 237 mL via ORAL

## 2022-11-13 MED ORDER — CLOPIDOGREL BISULFATE 75 MG PO TABS
75.0000 mg | ORAL_TABLET | Freq: Every day | ORAL | Status: DC
  Administered 2022-11-14 – 2022-11-15 (×2): 75 mg via ORAL
  Filled 2022-11-13 (×2): qty 1

## 2022-11-13 MED ORDER — ACETAMINOPHEN 325 MG PO TABS: 650.0000 mg | ORAL_TABLET | Freq: Four times a day (QID) | ORAL | Status: DC | PRN

## 2022-11-13 MED ORDER — BUDESON-GLYCOPYRROL-FORMOTEROL 160-9-4.8 MCG/ACT IN AERO: 2.0000 | INHALATION_SPRAY | Freq: Two times a day (BID) | RESPIRATORY_TRACT | Status: DC

## 2022-11-13 MED ORDER — ACETAMINOPHEN 650 MG RE SUPP: 650.0000 mg | Freq: Four times a day (QID) | RECTAL | Status: DC | PRN

## 2022-11-13 MED ORDER — OXYCODONE HCL 5 MG PO TABS: 5.0000 mg | ORAL_TABLET | ORAL | Status: DC | PRN

## 2022-11-13 MED ORDER — MORPHINE SULFATE (PF) 2 MG/ML IV SOLN: 2.0000 mg | INTRAVENOUS | Status: DC | PRN

## 2022-11-13 MED ORDER — HEPARIN SODIUM (PORCINE) 5000 UNIT/ML IJ SOLN
5000.0000 [IU] | Freq: Three times a day (TID) | INTRAMUSCULAR | Status: DC
  Filled 2022-11-13 (×2): qty 1

## 2022-11-13 MED ORDER — CARVEDILOL 12.5 MG PO TABS
12.5000 mg | ORAL_TABLET | Freq: Two times a day (BID) | ORAL | Status: DC
  Administered 2022-11-14: 12.5 mg via ORAL
  Filled 2022-11-13: qty 1

## 2022-11-13 MED ORDER — PROMETHAZINE HCL 12.5 MG PO TABS
12.5000 mg | ORAL_TABLET | Freq: Four times a day (QID) | ORAL | Status: DC | PRN
  Administered 2022-11-15: 12.5 mg via ORAL
  Filled 2022-11-13: qty 1

## 2022-11-13 MED ORDER — LOSARTAN POTASSIUM 50 MG PO TABS
25.0000 mg | ORAL_TABLET | Freq: Every day | ORAL | Status: DC
  Administered 2022-11-14 – 2022-11-15 (×2): 25 mg via ORAL
  Filled 2022-11-13 (×2): qty 1

## 2022-11-13 MED ORDER — SODIUM CHLORIDE 0.9 % IV SOLN
1.0000 g | Freq: Once | INTRAVENOUS | Status: AC
  Administered 2022-11-13: 1 g via INTRAVENOUS
  Filled 2022-11-13: qty 10

## 2022-11-13 MED ORDER — SODIUM CHLORIDE 0.9 % IV SOLN
1.0000 g | INTRAVENOUS | Status: DC
  Administered 2022-11-14: 1 g via INTRAVENOUS
  Filled 2022-11-13: qty 10

## 2022-11-13 MED ORDER — ATORVASTATIN CALCIUM 40 MG PO TABS
40.0000 mg | ORAL_TABLET | Freq: Every evening | ORAL | Status: DC
  Administered 2022-11-13 – 2022-11-14 (×2): 40 mg via ORAL
  Filled 2022-11-13 (×2): qty 1

## 2022-11-13 NOTE — ED Triage Notes (Signed)
Passed out x2 this afternoon while walking  Pt stated that this has happened before Hx of COPD, facial CA, facial reconstruction.   Denies HA and dizziness Denis CP, stated that he is always SOB due to facial surgeries and COPD

## 2022-11-13 NOTE — ED Provider Notes (Signed)
[place] EMERGENCY DEPARTMENT AT [facility] Provider Note   CSN: [id number] Arrival date & time: 11/13/22  1734     History  Chief Complaint  Patient presents with   Loss of Consciousness    Michael [name] is a 67 y.o. male.  Pt indicates was out walking when had episode of feeling faint, as if about to pass out. Denies injury. States was able to get self up and continue walking, returned home, and had another episode where felt faint as if about tot pass out. Denies complete loc. Indicates had eaten/drank relatively little today. No preceding headache. No new neuro c/o - no change in speech or vision, no new numbness/weakness, no change in balance or coordination. Denies associated chest pain or discomfort, no sob or unusual doe. No palpitations. No abd pain or nvd. No melena or rectal bleeding. No dysuria or gu c/o. No extremity pain or swelling. Denies change in meds or new meds. No anticoagulant use.   The history is provided by the patient and medical records.  Loss of Consciousness Associated symptoms: no chest pain, no confusion, no fever, no headaches, no palpitations, no shortness of breath, no vomiting and no weakness        Home Medications Prior to Admission medications   Medication Sig Start Date End Date Taking? Authorizing Provider  albuterol (VENTOLIN HFA) 108 (90 Base) MCG/ACT inhaler Inhale 2 puffs into the lungs every 6 (six) hours as needed for wheezing or shortness of breath. 04/27/22   Shahmehdi, Gemma Payor, MD  atorvastatin (LIPITOR) 40 MG tablet Take 1 tablet (40 mg total) by mouth every evening. 08/01/22 11/05/22  Shahmehdi, Gemma Payor, MD  Budeson-Glycopyrrol-Formoterol (BREZTRI AEROSPHERE) 160-9-4.8 MCG/ACT AERO Inhale 2 puffs into the lungs 2 (two) times daily.    [provider]  carvedilol (COREG) 12.5 MG tablet Take 1 tablet (12.5 mg total) by mouth 2 (two) times daily with a meal. 08/18/22 11/16/22  Shahmehdi, Gemma Payor, MD  clopidogrel  (PLAVIX) 75 MG tablet Take 1 tablet (75 mg total) by mouth daily. 05/13/22   Strader, Lennart Pall, PA-C  Glycerin-Hypromellose-PEG 400 ([facility] DRY EYE RELIEF) 0.2-0.2-1 % SOLN Place 1 drop into the right eye daily.    [provider]  losartan (COZAAR) 25 MG tablet Take 1 tablet (25 mg total) by mouth daily. 08/19/22 11/05/22  Shahmehdi, Gemma Payor, MD  potassium chloride SA (KLOR-CON M) 20 MEQ tablet Take 1 tablet (20 mEq total) by mouth daily. 07/26/22 11/05/22  Shahmehdi, Gemma Payor, MD  REPATHA SURECLICK 140 MG/ML SOAJ Inject 1 mL into the skin every 14 (fourteen) days. 10/19/22   [provider]  torsemide (DEMADEX) 20 MG tablet Take 1 tablet (20 mg total) by mouth 2 (two) times daily. 08/18/22 11/16/22  Kendell Bane, MD      Allergies    Patient has no known allergies.    Review of Systems   Review of Systems  Constitutional:  Negative for chills and fever.  HENT:  Negative for sore throat.   Eyes:  Negative for redness.  Respiratory:  Negative for cough and shortness of breath.   Cardiovascular:  Positive for syncope. Negative for chest pain, palpitations and leg swelling.  Gastrointestinal:  Negative for abdominal pain, blood in stool, diarrhea and vomiting.  Genitourinary:  Negative for dysuria and flank pain.  Musculoskeletal:  Negative for back pain and neck pain.  Skin:  Negative for rash.  Neurological:  Positive for light-headedness. Negative for speech difficulty,  weakness, numbness and headaches.  Hematological:  Does not bruise/bleed easily.  Psychiatric/Behavioral:  Negative for confusion.     Physical Exam Updated Vital Signs BP (!) 156/98   Pulse 81   Temp 98.3 F (36.8 C)   Resp 18   Ht 1.829 m (6')   Wt 74.4 kg   SpO2 92%   BMI 22.24 kg/m  Physical Exam Vitals and nursing note reviewed.  Constitutional:      Appearance: Normal appearance. He is well-developed.  HENT:     Head: Atraumatic.     Nose: Nose normal.     Mouth/Throat:     Mouth:  Mucous membranes are moist.     Pharynx: Oropharynx is clear.  Eyes:     General: No scleral icterus.    Conjunctiva/sclera: Conjunctivae normal.     Pupils: Pupils are equal, round, and reactive to light.  Neck:     Vascular: No carotid bruit.     Trachea: No tracheal deviation.  Cardiovascular:     Rate and Rhythm: Normal rate and regular rhythm.     Pulses: Normal pulses.     Heart sounds: Normal heart sounds. No murmur heard.    No friction rub. No gallop.  Pulmonary:     Effort: Pulmonary effort is normal. No accessory muscle usage or respiratory distress.     Breath sounds: Normal breath sounds.  Chest:     Chest wall: No tenderness.  Abdominal:     General: Bowel sounds are normal. There is no distension.     Palpations: Abdomen is soft. There is no mass.     Tenderness: There is no abdominal tenderness. There is no guarding.  Genitourinary:    Comments: No cva tenderness. Musculoskeletal:        General: No swelling.     Cervical back: Normal range of motion and neck supple. No rigidity or tenderness.     Comments: CTLS spine, non tender, aligned, no step off. Good rom bil extremities without pain or focal bony tenderness.   Skin:    General: Skin is warm and dry.     Findings: No rash.  Neurological:     Mental Status: He is alert.     Comments: Alert, speech clear. Motor/sens grossly intact bil. GCS 15.   Psychiatric:        Mood and Affect: Mood normal.     ED Results / Procedures / Treatments   Labs (all labs ordered are listed, but only abnormal results are displayed) Results for orders placed or performed during the hospital encounter of 11/13/22  Basic metabolic panel  Result Value Ref Range   Sodium 138 135 - 145 mmol/L   Potassium 3.2 (L) 3.5 - 5.1 mmol/L   Chloride 108 98 - 111 mmol/L   CO2 23 22 - 32 mmol/L   Glucose, Bld 104 (H) 70 - 99 mg/dL   BUN 27 (H) 8 - 23 mg/dL   Creatinine, Ser 1.30 (H) 0.61 - 1.24 mg/dL   Calcium 8.7 (L) 8.9 - 10.3  mg/dL   GFR, Estimated 37 (L) >60 mL/min   Anion gap 7 5 - 15  CBC  Result Value Ref Range   WBC 7.3 4.0 - 10.5 K/uL   RBC 4.25 4.22 - 5.81 MIL/uL   Hemoglobin 11.6 (L) 13.0 - 17.0 g/dL   HCT 86.5 (L) 78.4 - 69.6 %   MCV 90.6 80.0 - 100.0 fL   MCH 27.3 26.0 - 34.0 pg  MCHC 30.1 30.0 - 36.0 g/dL   RDW 53.6 64.4 - 03.4 %   Platelets 299 150 - 400 K/uL   nRBC 0.0 0.0 - 0.2 %  Urinalysis, Routine w reflex microscopic -Urine, Clean Catch  Result Value Ref Range   Color, Urine AMBER (A) YELLOW   APPearance HAZY (A) CLEAR   Specific Gravity, Urine 1.017 1.005 - 1.030   pH 5.0 5.0 - 8.0   Glucose, UA NEGATIVE NEGATIVE mg/dL   Hgb urine dipstick SMALL (A) NEGATIVE   Bilirubin Urine NEGATIVE NEGATIVE   Ketones, ur NEGATIVE NEGATIVE mg/dL   Protein, ur 742 (A) NEGATIVE mg/dL   Nitrite NEGATIVE NEGATIVE   Leukocytes,Ua MODERATE (A) NEGATIVE   RBC / HPF 6-10 0 - 5 RBC/hpf   WBC, UA >50 0 - 5 WBC/hpf   Bacteria, UA MANY (A) NONE SEEN   Squamous Epithelial / HPF 0-5 0 - 5 /HPF   WBC Clumps PRESENT    Mucus PRESENT    Hyaline Casts, UA PRESENT   CBG monitoring, ED  Result Value Ref Range   Glucose-Capillary 99 70 - 99 mg/dL   US Carotid Bilateral  Result Date: 11/05/2022 CLINICAL DATA:  Near syncopal episode. History of CAD and hypertension. History of previous right ICA stent placement. EXAM: BILATERAL CAROTID DUPLEX ULTRASOUND TECHNIQUE: Wallace Cullens scale imaging, color Doppler and duplex ultrasound were performed of bilateral carotid and vertebral arteries in the neck. COMPARISON:  Carotid Doppler ultrasound-06/25/2017 FINDINGS: Criteria: Quantification of carotid stenosis is based on velocity parameters that correlate the residual internal carotid diameter with NASCET-based stenosis levels, using the diameter of the distal internal carotid lumen as the denominator for stenosis measurement. The following velocity measurements were obtained: RIGHT ICA: 80/14 cm/sec CCA: 41/5 cm/sec SYSTOLIC  ICA/CCA RATIO:  1.9 ECA: 243 cm/sec LEFT ICA: 72/17 cm/sec CCA: 65/13 cm/sec SYSTOLIC ICA/CCA RATIO:  1.1 ECA: 103 cm/sec RIGHT CAROTID ARTERY: There is a minimal amount of eccentric mixed echogenic plaque throughout the right common carotid artery (image 7 and 11). The patient is post stenting of the right carotid bulb and right internal carotid arteries. The stent appears widely patent without elevated peak systolic velocities within either the carotid bulb or internal carotid artery to suggest a residual or recurrent hemodynamically significant narrowing. Presently, greatest acquired peak systolic velocity in right internal carotid artery measures 80 centimeters/seconds (image 26), previously, greatest acquired peak systolic velocity within the proximal right ICA measured 337 centimeters/second. RIGHT VERTEBRAL ARTERY:  Antegrade flow LEFT CAROTID ARTERY: There is a moderate amount of eccentric echogenic plaque within the distal aspect of the left common carotid artery (images 44 and 45). There is a moderate-to-large amount of nearly concentric atherosclerotic plaque throughout the left common carotid artery (images 48 and 50), extending to involve the origin and proximal aspects of the left internal carotid artery (image 58), morphologically progressed compared to the 2019 examination though again not resulting in elevated peak systolic velocities within the interrogated course of the left internal carotid artery to suggest a hemodynamically significant stenosis. LEFT VERTEBRAL ARTERY:  Antegrade flow IMPRESSION: 1. Interval stenting of the right carotid bulb and internal carotid arteries without a residual or recurrent hemodynamically significant narrowing. 2. Moderate-to-large amount of left-sided atherosclerotic plaque, morphologically progressed compared to the 2019 examination though not resulting in elevated peak systolic velocities to suggest a hemodynamically significant stenosis. If clinical concern  persists, further evaluation with CTA could be performed as indicated. Electronically Signed   By: Holland Commons.D.  On: 11/05/2022 16:03   ECHOCARDIOGRAM LIMITED  Result Date: 11/05/2022    ECHOCARDIOGRAM LIMITED REPORT   Patient Name:   KATHARINE YANNUZZI Date of Exam: 11/05/2022 Medical Rec #:  [id number]     Height:       72.0 in Accession #:    [id number]    Weight:       144.0 lb Date of Birth:  Nov 20, 1955     BSA:          1.853 m Patient Age:    67 years      BP:           126/87 mmHg Patient Gender: M             HR:           62 bpm. Exam Location:  Inpatient Procedure: Cardiac Doppler, Color Doppler and Limited Echo Indications:    Syncope R55  History:        Patient has prior history of Echocardiogram examinations, most                 recent 08/16/2022. Cardiomyopathy, CAD, COPD; Risk                 Factors:Hypertension.  Sonographer:    Harriette Bouillon RDCS Referring Phys: [id number] OLADAPO ADEFESO IMPRESSIONS  1. No change in severely reduced EF from TTE done 08/16/22 Limited echo No mural apical thrombus. Left ventricular ejection fraction, by estimation, is 25%. The left ventricle demonstrates global hypokinesis. The left ventricular internal cavity size was  severely dilated.  2. Right ventricular systolic function is mildly reduced. The right ventricular size is mildly enlarged.  3. The mitral valve is abnormal. Mild mitral valve regurgitation. FINDINGS  Left Ventricle: No change in severely reduced EF from TTE done 08/16/22 Limited echo No mural apical thrombus. Left ventricular ejection fraction, by estimation, is 25%. The left ventricle demonstrates global hypokinesis. The left ventricular internal cavity size was severely dilated. Right Ventricle: The right ventricular size is mildly enlarged. Right ventricular systolic function is mildly reduced. Pericardium: There is no evidence of pericardial effusion. Mitral Valve: The mitral valve is abnormal. There is mild thickening of the mitral valve  leaflet(s). Mild mitral valve regurgitation. Tricuspid Valve: Tricuspid valve regurgitation is mild. LEFT VENTRICLE PLAX 2D LVIDd:         6.80 cm LVIDs:         6.50 cm LV PW:         0.90 cm LV IVS:        0.90 cm  Charlton Haws MD Electronically signed by Charlton Haws MD Signature Date/Time: 11/05/2022/1:33:22 PM    Final    DG Chest Port 1 View  Result Date: 11/04/2022 CLINICAL DATA:  Syncope.  Shortness of breath. EXAM: PORTABLE CHEST 1 VIEW COMPARISON:  08/15/2022 FINDINGS: Chronic cardiomegaly. Stable mediastinal contours. Previous lower lobe opacities have resolved. Chronic interstitial coarsening. No convincing pulmonary edema. No pneumothorax. No large pleural effusion. IMPRESSION: 1. Chronic cardiomegaly. 2. Previous bilateral airspace opacities have resolved. No acute findings. Electronically Signed   By: Narda Rutherford M.D.   On: 11/04/2022 19:05    EKG EKG Interpretation Date/Time:  Sunday November 13 2022 17:47:09 EDT Ventricular Rate:  80 PR Interval:  264 QRS Duration:  128 QT Interval:  442 QTC Calculation: 509 R Axis:   57  Text Interpretation: Sinus rhythm with 1st degree A-V block with Premature supraventricular complexes Non-specific intra-ventricular conduction block Non-specific ST-t changes Confirmed by  Cathren Laine ([id number]) on 11/13/2022 5:53:36 PM  Radiology No results found.  Procedures Procedures    Medications Ordered in ED Medications  cefTRIAXone (ROCEPHIN) 1 g in sodium chloride 0.9 % 100 mL IVPB (has no administration in time range)  potassium chloride SA (KLOR-CON M) CR tablet 40 mEq (has no administration in time range)    ED Course/ Medical Decision Making/ A&P                                 Medical Decision Making Problems Addressed: Acute UTI: acute illness or injury with systemic symptoms that poses a threat to life or bodily functions Elevated blood pressure reading: acute illness or injury Hypokalemia: acute illness or injury Near  syncope: acute illness or injury with systemic symptoms that poses a threat to life or bodily functions Other cardiomyopathy (HCC): chronic illness or injury with exacerbation, progression, or side effects of treatment that poses a threat to life or bodily functions Stage 3b chronic kidney disease (HCC): chronic illness or injury with exacerbation, progression, or side effects of treatment that poses a threat to life or bodily functions  Amount and/or Complexity of Data Reviewed External Data Reviewed: labs and notes. Labs: ordered. Decision-making details documented in ED Course. Radiology: ordered and independent interpretation performed. Decision-making details documented in ED Course. ECG/medicine tests: ordered and independent interpretation performed. Decision-making details documented in ED Course.  Risk Prescription drug management. Decision regarding hospitalization.   Iv ns. Continuous pulse ox and cardiac monitoring. Labs ordered/sent.   Differential diagnosis includes near syncope, syncope, dehydration, aki, anemia, etc. Dispo decision including potential need for admission considered - will get labs and imaging and reassess.   Reviewed nursing notes and prior charts for additional history. External reports reviewed. Recent admission for similar symptoms  - pt had left ama then.   Cardiac monitor: sinus rhythm, rate 80.  Labs reviewed/interpreted by me - ckd c/w prior. Hgb 11.6 c/w prior. Wbc normal. K mildly low. Kcl po.   Recent imaging studies reviewed/interpreted by me - echo w ef 25% c/w prior. Carotid duplex without critical acute caroltid disease then.   Additional labs reviewed/interpreted by me - ua c/w uti. Cx sent. Rocephin iv.   Given weakness/falls, recurrent syncope/near syncope, uti, etc. Will consult medicine for admission.           Final Clinical Impression(s) / ED Diagnoses Final diagnoses:  Near syncope  Elevated blood pressure reading  Stage  3b chronic kidney disease (HCC)  Acute UTI  Hypokalemia  Other cardiomyopathy [facility])    Rx / DC Orders ED Discharge Orders     None         Cathren Laine, MD 11/13/22 2024

## 2022-11-13 NOTE — ED Triage Notes (Signed)
Pt stated he was vomiting while walking and then passed out. Denies injuries

## 2022-11-13 NOTE — H&P (Signed)
History and Physical    Patient: Michael [name] ZOX:[id number] DOB: 10-Mar-1955 DOA: 11/13/2022 DOS: the patient was seen and examined on 11/13/2022 PCP: Benita Stabile, MD  Patient coming from: {Point_of_Origin:[address]}  Chief Complaint:  Chief Complaint  Patient presents with   Loss of Consciousness   HPI: Michael [name] is a 67 y.o. male with medical history significant of ***  Review of Systems: {ROS_Text:26778} Past Medical History:  Diagnosis Date   Aortic atherosclerosis (HCC) 08/03/2021   CAD (coronary artery disease) 08/03/2021   Cardiac catheterization September 2023 with RCA/RV marginal stenosis managed medically   Cardiomyopathy (HCC)    Carotid artery disease (HCC)    CKD (chronic kidney disease) stage 3, GFR 30-59 ml/min (HCC)    COPD (chronic obstructive pulmonary disease) (HCC)    Essential hypertension    Head and neck cancer (HCC) 2019   Right facial basal cell carcinoma s/p resection with right partial mastectomy and partal rhinectomy with skin graft (06/13/17)   Iron deficiency anemia    Urinary retention    Past Surgical History:  Procedure Laterality Date   ANKLE CLOSED REDUCTION Right    open reduction   BASAL CELL CARCINOMA EXCISION  2019   at [facility]   BIOPSY  07/19/2021   Procedure: BIOPSY;  Surgeon: Lanelle Bal, DO;  [facility];  Service: Endoscopy;;   COLONOSCOPY N/A 05/31/2017   Procedure: COLONOSCOPY;  Surgeon: Corbin Ade, MD;  Location: [facility];  Service: Endoscopy;  Laterality: N/A;  2:45pm   COLONOSCOPY WITH PROPOFOL N/A 07/19/2021   Procedure: COLONOSCOPY WITH PROPOFOL;  Surgeon: Lanelle Bal, DO;  Location: [facility];  Service: Endoscopy;  Laterality: N/A;  3:00pm, moved up to 9:00   CYSTOSCOPY N/A 10/29/2018   Procedure: CYSTOSCOPY, CLOT EVACUATION;  Surgeon: Rene Paci, MD;  Location: [facility];  Service: Urology;  Laterality: N/A;   PARTIAL COLECTOMY Right 08/11/2021   Procedure: PARTIAL  COLECTOMY, OPEN RIGHT HEMICOLECTOMY;  Surgeon: Lucretia Roers, MD;  Location: [facility];  Service: General;  Laterality: Right;   POLYPECTOMY  05/31/2017   Procedure: POLYPECTOMY;  Surgeon: Corbin Ade, MD;  Location: [facility];  Service: Endoscopy;;   RIGHT/LEFT HEART CATH AND CORONARY ANGIOGRAPHY N/A 10/22/2021   Procedure: RIGHT/LEFT HEART CATH AND CORONARY ANGIOGRAPHY;  Surgeon: Orbie Pyo, MD;  Location: [facility];  Service: Cardiovascular;  Laterality: N/A;   TONSILLECTOMY     TRANSCAROTID ARTERY REVASCULARIZATION  Right 02/15/2021   Procedure: RIGHT TRANSCAROTID ARTERY REVASCULARIZATION;  Surgeon: Cephus Shelling, MD;  [facility] OR;  Service: Vascular;  Laterality: Right;   ULTRASOUND GUIDANCE FOR VASCULAR ACCESS Left 02/15/2021   Procedure: ULTRASOUND GUIDANCE FOR VASCULAR ACCESS;  Surgeon: Cephus Shelling, MD;  Location: [facility];  Service: Vascular;  Laterality: Left;   XI ROBOTIC ASSISTED SIMPLE PROSTATECTOMY N/A 10/29/2018   Procedure: XI ROBOTIC ASSISTED SIMPLE PROSTATECTOMY;  Surgeon: Malen Gauze, MD;  Location: [facility];  Service: Urology;  Laterality: N/A;   Social History:  reports that he quit smoking about 16 months ago. His smoking use included cigarettes. He started smoking about 41 years ago. He has a 20 pack-year smoking history. He has been exposed to tobacco smoke. He has never used smokeless tobacco. He reports that he does not currently use alcohol. He reports that he does not use drugs.  No Known Allergies  Family History  Problem Relation Age of Onset   Stroke Father  Cirrhosis Mother    Colon cancer Neg Hx     Prior to Admission medications   Medication Sig Start Date End Date Taking? Authorizing Provider  albuterol (VENTOLIN HFA) 108 (90 Base) MCG/ACT inhaler Inhale 2 puffs into the lungs every 6 (six) hours as needed for wheezing or shortness of breath. 04/27/22   Shahmehdi, Gemma Payor, MD  atorvastatin (LIPITOR) 40 MG tablet  Take 1 tablet (40 mg total) by mouth every evening. 08/01/22 11/05/22  Shahmehdi, Gemma Payor, MD  Budeson-Glycopyrrol-Formoterol (BREZTRI AEROSPHERE) 160-9-4.8 MCG/ACT AERO Inhale 2 puffs into the lungs 2 (two) times daily.    [provider]  carvedilol (COREG) 12.5 MG tablet Take 1 tablet (12.5 mg total) by mouth 2 (two) times daily with a meal. 08/18/22 11/16/22  Shahmehdi, Gemma Payor, MD  clopidogrel (PLAVIX) 75 MG tablet Take 1 tablet (75 mg total) by mouth daily. 05/13/22   Strader, Lennart Pall, PA-C  Glycerin-Hypromellose-PEG 400 ([facility] DRY EYE RELIEF) 0.2-0.2-1 % SOLN Place 1 drop into the right eye daily.    [provider]  losartan (COZAAR) 25 MG tablet Take 1 tablet (25 mg total) by mouth daily. 08/19/22 11/05/22  Shahmehdi, Gemma Payor, MD  potassium chloride SA (KLOR-CON M) 20 MEQ tablet Take 1 tablet (20 mEq total) by mouth daily. 07/26/22 11/05/22  Shahmehdi, Gemma Payor, MD  REPATHA SURECLICK 140 MG/ML SOAJ Inject 1 mL into the skin every 14 (fourteen) days. 10/19/22   [provider]  torsemide (DEMADEX) 20 MG tablet Take 1 tablet (20 mg total) by mouth 2 (two) times daily. 08/18/22 11/16/22  Kendell Bane, MD    Physical Exam: Vitals:   11/13/22 1749 11/13/22 1930 11/13/22 2100 11/13/22 2139  BP: (!) 149/107 (!) 156/98 (!) 164/121 (!) 151/113  Pulse: 80 81 76 73  Resp: 19 18  16   Temp: 98.3 F (36.8 C)     SpO2: 95% 92% 99% 94%  Weight:      Height:       *** Data Reviewed: {Tip this will not be part of the note when signed- Document your independent interpretation of telemetry tracing, EKG, lab, Radiology test or any other diagnostic tests. Add any new diagnostic test ordered today. (Optional):26781} {Results:26384}  Assessment and Plan: No notes have been filed under this hospital service. Service: Hospitalist     Advance Care Planning:   Code Status: Limited: Do not attempt resuscitation (DNR) -DNR-LIMITED -Do Not Intubate/DNI  ***  Consults:  ***  Family Communication: ***  Severity of Illness: {Observation/Inpatient:21159}  Author: Lilyan Gilford, DO 11/13/2022 10:01 PM  For on call review [email or web address].

## 2022-11-13 NOTE — ED Notes (Signed)
Patient transported to CT 

## 2022-11-14 ENCOUNTER — Other Ambulatory Visit: Payer: 59

## 2022-11-14 ENCOUNTER — Telehealth: Payer: Self-pay

## 2022-11-14 DIAGNOSIS — R55 Syncope and collapse: Secondary | ICD-10-CM | POA: Diagnosis not present

## 2022-11-14 DIAGNOSIS — R42 Dizziness and giddiness: Secondary | ICD-10-CM

## 2022-11-14 LAB — COMPREHENSIVE METABOLIC PANEL
ALT: 23 U/L (ref 0–44)
AST: 26 U/L (ref 15–41)
Albumin: 3.2 g/dL — ABNORMAL LOW (ref 3.5–5.0)
Alkaline Phosphatase: 123 U/L (ref 38–126)
Anion gap: 10 (ref 5–15)
BUN: 29 mg/dL — ABNORMAL HIGH (ref 8–23)
CO2: 23 mmol/L (ref 22–32)
Calcium: 8.6 mg/dL — ABNORMAL LOW (ref 8.9–10.3)
Chloride: 107 mmol/L (ref 98–111)
Creatinine, Ser: 2.09 mg/dL — ABNORMAL HIGH (ref 0.61–1.24)
GFR, Estimated: 34 mL/min — ABNORMAL LOW (ref >60)
Glucose, Bld: 117 mg/dL — ABNORMAL HIGH (ref 70–99)
Potassium: 3.3 mmol/L — ABNORMAL LOW (ref 3.5–5.1)
Sodium: 140 mmol/L (ref 135–145)
Total Bilirubin: 1.4 mg/dL — ABNORMAL HIGH (ref 0.3–1.2)
Total Protein: 6.7 g/dL (ref 6.5–8.1)

## 2022-11-14 LAB — CBC WITH DIFFERENTIAL/PLATELET
Abs Immature Granulocytes: 0.02 10*3/uL (ref 0.00–0.07)
Basophils Absolute: 0.1 10*3/uL (ref 0.0–0.1)
Basophils Relative: 1 %
Eosinophils Absolute: 0.2 10*3/uL (ref 0.0–0.5)
Eosinophils Relative: 2 %
HCT: 37.6 % — ABNORMAL LOW (ref 39.0–52.0)
Hemoglobin: 11.5 g/dL — ABNORMAL LOW (ref 13.0–17.0)
Immature Granulocytes: 0 %
Lymphocytes Relative: 24 %
Lymphs Abs: 1.7 10*3/uL (ref 0.7–4.0)
MCH: 27.7 pg (ref 26.0–34.0)
MCHC: 30.6 g/dL (ref 30.0–36.0)
MCV: 90.6 fL (ref 80.0–100.0)
Monocytes Absolute: 0.7 10*3/uL (ref 0.1–1.0)
Monocytes Relative: 9 %
Neutro Abs: 4.5 10*3/uL (ref 1.7–7.7)
Neutrophils Relative %: 64 %
Platelets: 303 10*3/uL (ref 150–400)
RBC: 4.15 MIL/uL — ABNORMAL LOW (ref 4.22–5.81)
RDW: 15.3 % (ref 11.5–15.5)
WBC: 7.2 10*3/uL (ref 4.0–10.5)
nRBC: 0 % (ref 0.0–0.2)

## 2022-11-14 LAB — MAGNESIUM: Magnesium: 2.2 mg/dL (ref 1.7–2.4)

## 2022-11-14 MED ORDER — ADULT MULTIVITAMIN W/MINERALS CH
1.0000 | ORAL_TABLET | Freq: Every day | ORAL | Status: DC
  Administered 2022-11-14 – 2022-11-15 (×2): 1 via ORAL
  Filled 2022-11-14 (×2): qty 1

## 2022-11-14 MED ORDER — CARVEDILOL 3.125 MG PO TABS
6.2500 mg | ORAL_TABLET | Freq: Two times a day (BID) | ORAL | Status: DC
  Administered 2022-11-14 – 2022-11-15 (×2): 6.25 mg via ORAL
  Filled 2022-11-14 (×2): qty 2

## 2022-11-14 MED ORDER — POTASSIUM CHLORIDE CRYS ER 20 MEQ PO TBCR
40.0000 meq | EXTENDED_RELEASE_TABLET | Freq: Once | ORAL | Status: AC
  Administered 2022-11-14: 40 meq via ORAL
  Filled 2022-11-14: qty 4

## 2022-11-14 MED ORDER — TORSEMIDE 20 MG PO TABS
20.0000 mg | ORAL_TABLET | Freq: Every day | ORAL | Status: DC
  Administered 2022-11-15: 20 mg via ORAL
  Filled 2022-11-14: qty 1

## 2022-11-14 MED ORDER — POTASSIUM CHLORIDE CRYS ER 20 MEQ PO TBCR
40.0000 meq | EXTENDED_RELEASE_TABLET | Freq: Every day | ORAL | Status: DC
  Administered 2022-11-14 – 2022-11-15 (×2): 40 meq via ORAL
  Filled 2022-11-14 (×2): qty 2

## 2022-11-14 NOTE — Assessment & Plan Note (Signed)
-   Likely related to hypokalemia - Replace potassium and recheck - Monitor on telemetry

## 2022-11-14 NOTE — Telephone Encounter (Addendum)
Per Dr.Branch, 14 day LIVE  Zio for dizziness   Pt in room 322 at [facility]   Monitor [id number] applied, instructions for use and return date of 10/28 given,monitor box placed on chair with patients clothes.

## 2022-11-14 NOTE — Assessment & Plan Note (Signed)
-   Continue Breztri - No evidence of COPD exacerbation at this time - Counseled on importance of smoking cessation

## 2022-11-14 NOTE — Assessment & Plan Note (Addendum)
-  Monitoring closely - Replace and recheck

## 2022-11-14 NOTE — Assessment & Plan Note (Signed)
-   Working on quitting - Down to 3 cigarettes/day - No nicotine patch at this time

## 2022-11-14 NOTE — Assessment & Plan Note (Addendum)
Last cath 10/20/2021, moderate CAD, with medical management recommendation -Denies any chest pain - Continue statin, beta-blocker, ARB, Plavix, Imdur, - Troponin downtrending from recent hospitalization from 96-88 - Monitor on telemetry

## 2022-11-14 NOTE — Assessment & Plan Note (Signed)
Stage IIIb CKD Lab Results  Component Value Date   CREATININE 2.09 (H) 11/14/2022   CREATININE 1.96 (H) 11/13/2022   CREATININE 2.04 (H) 11/05/2022   Monitoring closely in setting of advanced heart failure

## 2022-11-14 NOTE — Hospital Course (Signed)
Mr. Neuman has above PMHx.  HFrEF, EJF 25%, CAD, last catheter 10/20/2021, moderate obstructive RCA, s/p right cardiac cath January 2023, chronic iron deficiency anemia, HTN, HLD, COPD, stage III CKD,... Presented once again with presyncope episode.   Was hospital 08/2022 with CHF and orthostatic hypotension. Palliative care recommended. Patient admitted 11/04/22 with 2 episoded of near syncope while walking, BP elevated on admission 177/111, BNP chronic elevated 4500, hypokalemia replaced. He signed out AMA.   He returned yesterday with 2 episodes of syncope while walking.He took his morning meds 8 am and passed out around noon.  He becomes dizzy and collapses to the ground. Second episode occurred 1 hr later while walking. Says he was walking for about 5 min before it happened. Thinks he's out about 3 minutes and wakes up and is fine. Doesn't drink much water because it puts him in heart failure.  Lives with another man that helps him with shopping(he doesn't drive). Was found to be hypokalemic-K 3.3 today and UTI.orthostatic BP 141/99L, 128/99 sitting, 145/116 standing. Has had some abdominal pain and has know large gallstone with elevated LFT's in past. Tbili 1.4.

## 2022-11-14 NOTE — Assessment & Plan Note (Addendum)
-  Last cath 10/20/2021, revealing mid to moderate CAD,-on medical management - Last echo showed cardiomyopathy with ejection fraction of 25% 11/05/2022 - Continue statin, beta-blocker, ARB -With known CKD stage III, - Holding torsemide at this time - Continue to monitor  -I's and O's, daily weight

## 2022-11-14 NOTE — Progress Notes (Signed)
Initial Nutrition Assessment  DOCUMENTATION CODES:   Non-severe (moderate) malnutrition in context of chronic illness  INTERVENTION:   Ensure Enlive po BID, each supplement provides 350 kcal and 20 grams of protein. MVI with minerals daily.  NUTRITION DIAGNOSIS:   Moderate Malnutrition related to chronic illness (COPD, CHF) as evidenced by mild fat depletion, mild muscle depletion, moderate muscle depletion.  GOAL:   Patient will meet greater than or equal to 90% of their needs  MONITOR:   PO intake  REASON FOR ASSESSMENT:   Malnutrition Screening Tool    ASSESSMENT:   67 yo male admitted with syncope. PMH includes right facial basal cell carcinoma, CAD, HTN, COPD, CKD, CHF.  Patient states that he has lost 60 lbs within the past year despite having a good appetite and eating well d/t being sick. Weight encounters do not reflect this. Weight has fluctuated up and down from 60.6 kg to 72.6 kg over the past year. Currently 67.4 kg. Suspect weight fluctuations are related to fluids with hx CHF. He likes Ensure supplements and would like to receive them between meals. He ate 100% of breakfast today.   Labs reviewed. K 3.3  Medications reviewed and include potassium chloride.  Patient meets criteria for moderate malnutrition, given mild-moderate depletion of muscle and subcutaneous fat mass.  NUTRITION - FOCUSED PHYSICAL EXAM:  Flowsheet Row Most Recent Value  Orbital Region No depletion  Upper Arm Region Mild depletion  Thoracic and Lumbar Region Mild depletion  Buccal Region Mild depletion  Temple Region Mild depletion  Clavicle Bone Region Moderate depletion  Clavicle and Acromion Bone Region Moderate depletion  Scapular Bone Region Moderate depletion  Dorsal Hand Mild depletion  Patellar Region Mild depletion  Anterior Thigh Region Mild depletion  Posterior Calf Region Mild depletion  Edema (RD Assessment) None  Hair Reviewed  Eyes Reviewed  Mouth Reviewed   Skin Reviewed  Nails Reviewed       Diet Order:   Diet Order             Diet Heart Room service appropriate? Yes; Fluid consistency: Thin  Diet effective now                   EDUCATION NEEDS:   Education needs have been addressed  Skin:  Skin Assessment: Reviewed RN Assessment  Last BM:  unknown  Height:   Ht Readings from Last 1 Encounters:  11/13/22 6' (1.829 m)    Weight:   Wt Readings from Last 1 Encounters:  11/13/22 67.4 kg    BMI:  Body mass index is 20.15 kg/m.  Estimated Nutritional Needs:   Kcal:  1950-2150  Protein:  80-95 gm  Fluid:  1.9-2.1 L   Gabriel Rainwater RD, LDN, CNSC Please refer to Amion for contact information.

## 2022-11-14 NOTE — Assessment & Plan Note (Signed)
-   UA indicative of UTI - Urine culture pending - Rocephin started in the ED - Continue Rocephin

## 2022-11-14 NOTE — Care Management Obs Status (Signed)
MEDICARE OBSERVATION STATUS NOTIFICATION   Patient Details  Name: Michael [name] MRN: [id number] Date of Birth: 1955/10/27   Medicare Observation Status Notification Given:  Yes    Corey Harold 11/14/2022, 4:46 PM

## 2022-11-14 NOTE — Consult Note (Addendum)
Cardiology Consultation   Patient ID: Keynan Matheny MRN: [id number]; DOB: Feb 26, 1955  Admit date: 11/13/2022 Date of Consult: 11/14/2022  PCP:  Benita Stabile, MD   [facility] Providers Cardiologist:  Nona Dell, MD        Patient Profile:   Riyansh Westmeyer is a 67 y.o. male with a hx of  HFrEF EF 25%, CAD (cath in 10/2021 showing mild to moderate obstructive CAD along mid-RCA and acute Mrg with medical management recommended), carotid artery stenosis (s/p right TCAR in 01/2021), iron deficiency anemia, HTN, HLD,  COPD and Stage 3 CKD  who is being seen 11/14/2022 for the evaluation of syncope at the request of Dr. Flossie Dibble.   History of Present Illness:   Mr. Mulloy has above PMHx. We last saw the patient in the hospital 08/2022 with CHF and orthostatic hypotension. Palliative care recommended. Patient admitted 11/04/22 with 2 episoded of near syncope while walking, BP elevated on admission 177/111, BNP chronic elevated 4500, hypokalemia replaced. He signed out AMA.  He returned yesterday with 2 episodes of syncope while walking.He took his morning meds 8 am and passed out around noon.  He becomes dizzy and collapses to the ground. Second episode occurred 1 hr later while walking. Says he was walking for about 5 min before it happened. Thinks he's out about 3 minutes and wakes up and is fine. Doesn't drink much water because it puts him in heart failure. Lives with another man that helps him with shopping(he doesn't drive). Was found to be hypokalemic-K 3.3 today and UTI.orthostatic BP 141/99L, 128/99 sitting, 145/116 standing. Has had some abdominal pain and has know large gallstone with elevated LFT's in past. Tbili 1.4    Past Medical History:  Diagnosis Date   Aortic atherosclerosis (HCC) 08/03/2021   CAD (coronary artery disease) 08/03/2021   Cardiac catheterization September 2023 with RCA/RV marginal stenosis managed medically   Cardiomyopathy (HCC)     Carotid artery disease (HCC)    CKD (chronic kidney disease) stage 3, GFR 30-59 ml/min (HCC)    COPD (chronic obstructive pulmonary disease) (HCC)    Essential hypertension    Head and neck cancer (HCC) 2019   Right facial basal cell carcinoma s/p resection with right partial mastectomy and partal rhinectomy with skin graft (06/13/17)   Iron deficiency anemia    Urinary retention     Past Surgical History:  Procedure Laterality Date   ANKLE CLOSED REDUCTION Right    open reduction   BASAL CELL CARCINOMA EXCISION  2019   at [facility]   BIOPSY  07/19/2021   Procedure: BIOPSY;  Surgeon: Lanelle Bal, DO;  [facility]: [facility];  Service: Endoscopy;;   COLONOSCOPY N/A 05/31/2017   Procedure: COLONOSCOPY;  Surgeon: Corbin Ade, MD;  Location: [facility];  Service: Endoscopy;  Laterality: N/A;  2:45pm   COLONOSCOPY WITH PROPOFOL N/A 07/19/2021   Procedure: COLONOSCOPY WITH PROPOFOL;  Surgeon: Lanelle Bal, DO;  [facility]: [facility];  Service: Endoscopy;  Laterality: N/A;  3:00pm, moved up to 9:00   CYSTOSCOPY N/A 10/29/2018   Procedure: CYSTOSCOPY, CLOT EVACUATION;  Surgeon: Rene Paci, MD;  Location: [facility];  Service: Urology;  Laterality: N/A;   PARTIAL COLECTOMY Right 08/11/2021   Procedure: PARTIAL COLECTOMY, OPEN RIGHT HEMICOLECTOMY;  Surgeon: Lucretia Roers, MD;  Location: [facility];  Service: General;  Laterality: Right;   POLYPECTOMY  05/31/2017   Procedure: POLYPECTOMY;  Surgeon: Corbin Ade, MD;  [facility];  Service: Endoscopy;;   RIGHT/LEFT HEART CATH AND CORONARY ANGIOGRAPHY N/A 10/22/2021   Procedure: RIGHT/LEFT HEART CATH AND CORONARY ANGIOGRAPHY;  Surgeon: Orbie Pyo, MD;  Location: [facility];  Service: Cardiovascular;  Laterality: N/A;   TONSILLECTOMY     TRANSCAROTID ARTERY REVASCULARIZATION  Right 02/15/2021   Procedure: RIGHT TRANSCAROTID ARTERY REVASCULARIZATION;  Surgeon: Cephus Shelling, MD;  Location: [facility]  OR;  Service: Vascular;  Laterality: Right;   ULTRASOUND GUIDANCE FOR VASCULAR ACCESS Left 02/15/2021   Procedure: ULTRASOUND GUIDANCE FOR VASCULAR ACCESS;  Surgeon: Cephus Shelling, MD;  Location: [facility] OR;  Service: Vascular;  Laterality: Left;   XI ROBOTIC ASSISTED SIMPLE PROSTATECTOMY N/A 10/29/2018   Procedure: XI ROBOTIC ASSISTED SIMPLE PROSTATECTOMY;  Surgeon: Malen Gauze, MD;  Location: [facility];  Service: Urology;  Laterality: N/A;     Home Medications:  Prior to Admission medications   Medication Sig Start Date End Date Taking? Authorizing Provider  albuterol (VENTOLIN HFA) 108 (90 Base) MCG/ACT inhaler Inhale 2 puffs into the lungs every 6 (six) hours as needed for wheezing or shortness of breath. 04/27/22   Shahmehdi, Gemma Payor, MD  atorvastatin (LIPITOR) 40 MG tablet Take 1 tablet (40 mg total) by mouth every evening. 08/01/22 11/05/22  Shahmehdi, Gemma Payor, MD  Budeson-Glycopyrrol-Formoterol (BREZTRI AEROSPHERE) 160-9-4.8 MCG/ACT AERO Inhale 2 puffs into the lungs 2 (two) times daily.    [provider]  carvedilol (COREG) 12.5 MG tablet Take 1 tablet (12.5 mg total) by mouth 2 (two) times daily with a meal. 08/18/22 11/16/22  Shahmehdi, Gemma Payor, MD  clopidogrel (PLAVIX) 75 MG tablet Take 1 tablet (75 mg total) by mouth daily. 05/13/22   Strader, Lennart Pall, PA-C  Glycerin-Hypromellose-PEG 400 ([facility] DRY EYE RELIEF) 0.2-0.2-1 % SOLN Place 1 drop into the right eye daily.    [provider]  losartan (COZAAR) 25 MG tablet Take 1 tablet (25 mg total) by mouth daily. 08/19/22 11/05/22  Shahmehdi, Gemma Payor, MD  potassium chloride SA (KLOR-CON M) 20 MEQ tablet Take 1 tablet (20 mEq total) by mouth daily. 07/26/22 11/05/22  Shahmehdi, Gemma Payor, MD  REPATHA SURECLICK 140 MG/ML SOAJ Inject 1 mL into the skin every 14 (fourteen) days. 10/19/22   [provider]  torsemide (DEMADEX) 20 MG tablet Take 1 tablet (20 mg total) by mouth 2 (two) times daily. 08/18/22 11/16/22   Kendell Bane, MD    Inpatient Medications: Scheduled Meds:  atorvastatin  40 mg Oral QPM   carvedilol  12.5 mg Oral BID WC   clopidogrel  75 mg Oral Daily   feeding supplement  237 mL Oral BID BM   heparin  5,000 Units Subcutaneous Q8H   losartan  25 mg Oral Daily   mometasone-formoterol  2 puff Inhalation BID   potassium chloride  40 mEq Oral Once   umeclidinium bromide  1 puff Inhalation Daily   Continuous Infusions:  cefTRIAXone (ROCEPHIN)  IV     PRN Meds: acetaminophen **OR** acetaminophen, morphine injection, oxyCODONE, promethazine  Allergies:   No Known Allergies  Social History:   Social History   Socioeconomic History   Marital status: Single    Spouse name: Not on file   Number of children: Not on file   Years of education: 12   Highest education level: 12th grade  Occupational History   Not on file  Tobacco Use   Smoking status: Former    Current packs/day: 0.00  Average packs/day: 0.5 packs/day for 40.0 years (20.0 ttl pk-yrs)    Types: Cigarettes    Start date: 07/02/1981    Quit date: 07/02/2021    Years since quitting: 1.3    Passive exposure: Past   Smokeless tobacco: Never   Tobacco comments:    Quit on 06/09/21  Vaping Use   Vaping status: Never Used  Substance and Sexual Activity   Alcohol use: Not Currently   Drug use: Never   Sexual activity: Not Currently  Other Topics Concern   Not on file  Social History Narrative   Not on file   Social Determinants of Health   Financial Resource Strain: Low Risk  (08/19/2022)   Overall Financial Resource Strain (CARDIA)    Difficulty of Paying Living Expenses: Not hard at all  Food Insecurity: No Food Insecurity (11/13/2022)   Hunger Vital Sign    Worried About Running Out of Food in the Last Year: Never true    Ran Out of Food in the Last Year: Never true  Transportation Needs: No Transportation Needs (11/13/2022)   PRAPARE - Administrator, Civil Service (Medical): No     Lack of Transportation (Non-Medical): No  Physical Activity: Inactive (08/19/2022)   Exercise Vital Sign    Days of Exercise per Week: 0 days    Minutes of Exercise per Session: 0 min  Stress: No Stress Concern Present (08/19/2022)   [employer] of Occupational Health - Occupational Stress Questionnaire    Feeling of Stress : Not at all  Social Connections: Unknown (08/19/2022)   Social Connection and Isolation Panel [NHANES]    Frequency of Communication with Friends and Family: More than three times a week    Frequency of Social Gatherings with Friends and Family: More than three times a week    Attends Religious Services: More than 4 times per year    Active Member of [employer] or Organizations: Yes    Attends Banker Meetings: More than 4 times per year    Marital Status: Not on file  Intimate Partner Violence: Not At Risk (11/13/2022)   Humiliation, Afraid, Rape, and Kick questionnaire    Fear of Current or Ex-Partner: No    Emotionally Abused: No    Physically Abused: No    Sexually Abused: No    Family History:     Family History  Problem Relation Age of Onset   Stroke Father    Cirrhosis Mother    Colon cancer Neg Hx      ROS:  Please see the history of present illness.  Review of Systems  Constitutional: Negative.  HENT: Negative.    Cardiovascular:  Positive for dyspnea on exertion and syncope.  Respiratory: Negative.    Endocrine: Negative.   Hematologic/Lymphatic: Negative.   Musculoskeletal: Negative.   Gastrointestinal:  Positive for abdominal pain.  Genitourinary: Negative.   Neurological:  Positive for dizziness and light-headedness.    All other ROS reviewed and negative.     Physical Exam/Data:   Vitals:   11/13/22 2238 11/14/22 0100 11/14/22 0439 11/14/22 0727  BP: (!) 142/108 (!) 144/84 (!) 163/93   Pulse: 81 82 65   Resp: 18 18 18    Temp: 98.5 F (36.9 C) 98.4 F (36.9 C) (!) 97.5 F (36.4 C)   TempSrc: Oral Oral Oral    SpO2: 100% 97% 99% 97%  Weight: 67.4 kg     Height:       No intake or output  data in the 24 hours ending 11/14/22 0912    11/13/2022   10:38 PM 11/13/2022    5:42 PM 11/05/2022    7:52 AM  Last 3 Weights  Weight (lbs) 148 lb 9.4 oz 164 lb 144 lb  Weight (kg) 67.4 kg 74.39 kg 65.318 kg     Body mass index is 20.15 kg/m.  General: Thin, elderly, in no acute distress  HEENT:   facial surgery with reconstructed nose  Neck: no JVD Vascular: No carotid bruits; Distal pulses 2+ bilaterally Cardiac:  normal S1, S2; RRR; no murmur   Lungs:  decreased breath sounds but clear to auscultation bilaterally, no wheezing, rhonchi or rales  Abd: soft, nontender, no hepatomegaly  Ext: no edema Musculoskeletal:  No deformities, BUE and BLE strength normal and equal Skin: warm and dry  Neuro:  CNs 2-12 intact, no focal abnormalities noted Psych:  Normal affect   EKG:  The EKG was personally reviewed and demonstrates:  NSR with prolonged PR, freq PVC's old ant MI Telemetry:  Telemetry was personally reviewed and demonstrates:  NSR with frequent PVC's, couplets, 2-4 beats NSVT   Relevant CV Studies: Echo 11/05/22 IMPRESSIONS     1. No change in severely reduced EF from TTE done 08/16/22 Limited echo No  mural apical thrombus. Left ventricular ejection fraction, by estimation,  is 25%. The left ventricle demonstrates global hypokinesis. The left  ventricular internal cavity size was   severely dilated.   2. Right ventricular systolic function is mildly reduced. The right  ventricular size is mildly enlarged.   3. The mitral valve is abnormal. Mild mitral valve regurgitation.  R/LHC: 10/2021 Mid RCA lesion is 70% stenosed.   Acute Mrg lesion is 80% stenosed.   1.  Mild to moderate obstructive coronary artery disease that is outweighed by the patient's degree of LV dysfunction.  There is a significant bifurcation lesion of the right coronary artery and large RV marginal Jolisa Intriago.  This is  moderately calcified and relatively tortuous.  Given the patient's lack of chest pain, medical therapy should be pursued. 2.  Cardiac output of 6.3 L/min, cardiac index of 3.3 L/min/m, mean RA pressure of 16 mmHg, mean wedge pressure of 21 mmHg, and LVEDP of 21 mmHg.   Recommendation: Goal-directed medical therapy with augmentation of diuretics; consider dual antiplatelet therapy for 1 year for medical treatment of acute coronary syndrome.   Echocardiogram: 04/2022 IMPRESSIONS     1. Left ventricular ejection fraction, by estimation, is 35 to 40%. The  left ventricle has moderately decreased function. The left ventricle  demonstrates global hypokinesis. Left ventricular diastolic parameters are  indeterminate.   2. Right ventricular systolic function is normal. The right ventricular  size is normal. There is normal pulmonary artery systolic pressure.   3. The mitral valve is normal in structure. No evidence of mitral valve  regurgitation. No evidence of mitral stenosis.   4. The aortic valve has an indeterminant number of cusps. There is mild  calcification of the aortic valve. There is mild thickening of the aortic  valve. Aortic valve regurgitation is not visualized. No aortic stenosis is  present.   5. The inferior vena cava is normal in size with greater than 50%  respiratory variability, suggesting right atrial pressure of 3 mmHg.     Laboratory Data:  High Sensitivity Troponin:   Recent Labs  Lab 11/04/22 1626 11/04/22 1832 11/13/22 1753  TROPONINIHS 92* 96* 88*     Chemistry Recent Labs  Lab  11/13/22 1753 11/14/22 0428  NA 138 140  K 3.2* 3.3*  CL 108 107  CO2 23 23  GLUCOSE 104* 117*  BUN 27* 29*  CREATININE 1.96* 2.09*  CALCIUM 8.7* 8.6*  MG  --  2.2  GFRNONAA 37* 34*  ANIONGAP 7 10    Recent Labs  Lab 11/14/22 0428  PROT 6.7  ALBUMIN 3.2*  AST 26  ALT 23  ALKPHOS 123  BILITOT 1.4*   Lipids No results for input(s): "CHOL", "TRIG", "HDL",  "LABVLDL", "LDLCALC", "CHOLHDL" in the last 168 hours.  Hematology Recent Labs  Lab 11/13/22 1753 11/14/22 0428  WBC 7.3 7.2  RBC 4.25 4.15*  HGB 11.6* 11.5*  HCT 38.5* 37.6*  MCV 90.6 90.6  MCH 27.3 27.7  MCHC 30.1 30.6  RDW 15.3 15.3  PLT 299 303   Thyroid No results for input(s): "TSH", "FREET4" in the last 168 hours.  BNPNo results for input(s): "BNP", "PROBNP" in the last 168 hours.  DDimer No results for input(s): "DDIMER" in the last 168 hours.   Radiology/Studies:  CT HEAD WO CONTRAST ( )  Result Date: 11/13/2022 CLINICAL DATA:  Head trauma, minor (Age >= 65y) Syncope/presyncope, cerebrovascular cause suspected EXAM: CT HEAD WITHOUT CONTRAST TECHNIQUE: Contiguous axial images were obtained from the base of the skull through the vertex without intravenous contrast. RADIATION DOSE REDUCTION: This exam was performed according to the departmental dose-optimization program which includes automated exposure control, adjustment of the mA and/or kV according to patient size and/or use of iterative reconstruction technique. COMPARISON:  05/04/2022 FINDINGS: Brain: Extensive chronic small vessel disease and diffuse cerebral atrophy. Old lacunar infarcts in the left basal ganglia and internal capsule. No acute intracranial abnormality. Specifically, no hemorrhage, hydrocephalus, mass lesion, acute infarction, or significant intracranial injury. Vascular: No hyperdense vessel or unexpected calcification. Skull: No acute calvarial abnormality. Sinuses/Orbits: No acute findings postoperative changes. Other: None IMPRESSION: Atrophy, chronic microvascular disease. No acute intracranial abnormality. Old left basal ganglia and internal capsule lacunar infarcts. Electronically Signed   By: Charlett Nose M.D.   On: 11/13/2022 21:16     Assessment and Plan:   Syncope-multiple episodes while walking. Has history of orthostatic hypotension and on multiple meds. Baseline BP elevated but drops with  sitting. See vitals. EF 25%, no evidence of CHF on exam. K 3.2 yest, 3.3 today, UTI. With EF 25% could be arrhythmia. Freq PVC's and 2-4 beat NSVT on tele. Not sure if he's a candidate for ICD-talk in past about palliative care but now patient seems more stable.  HFrEF -  known cardiomyopathy with an EF at 25% on echo 07/2022, 08/2022, 11/05/22. Medical management has been limited secondary to orthostatic hypotension, CKD. Was on losartan 25 mg daily coreg 12.5 mg bid PTA -compensated     CAD - cardiac catheterization in 10/2021 showing mild to moderate obstructive CAD along the mid-RCA and acute Mrg with medical management recommended. - Continue current medical therapy with Plavix 75 mg daily, Imdur 60 mg daily Atorvastatin 40 mg daily  -no chest pain    HTN - His BP eleavated on admission and  but history of orthostatic hypotension during prior admissions   HLD - His LDL was at 64 in 07/2021.   Stage 3 CKD - Baseline creatinine appears to be between 1.7 - 1.9. today 2.09. K 3.3 this am will replace  UTI on Rocephin per primary team.       Risk Assessment/Risk Scores:  For questions or updates, please contact [facility] Please consult [email or web address] for contact info under    Signed, Jacolyn Reedy, PA-C  11/14/2022 9:12 AM  Attending note  Patient seen and discussed with PA Geni Bers, I agree with her documentation. 67 yo male history of chronic HFrEF, NICM, mild to moderate CAD, HTN, HLD, CKD, COPD, carotid stenosis with right TCAR Jan 2023, presents with near syncope.  Long term history, several notes from last year report episodes Most recently reports lightheadness/dizziness with related falls. Symptoms only occur while standing or walking, no episodes while lying down or sitting. Poor oral hydration, drinks only 3 glasses of tea and 2 cokes per day, no or very limited water intake. Denies any specific palpitations.    ER vitals: p 80 bp  149/107 95% RA K 3.2 BUN 27 Cr 1.96 WBC 7.3 Hgb 11.6 Plt 299  Trop 88--> EKG SR with first degree av block, LVH with strain pattern UA: mod WBCs, +bacteria CT head: old prior infarcts, no acute findings 11/05/22 carotid US: no significant stenosis  11/2022 echo: LVE 25%, mild RV dysfunction    1.Near syncope - long term issue,from notes multiple attempts to get a monitor but was never worn.   - episodes only occur while standing, poor oral hydration as documented above.  - orthostatics 10/5 were bordreline, SBP dropped 18 points sitting to standing. DBP at that check appears inaccurate. This admit orthostatics negative -baseline EKG shows SR, long first degree av block. He is on coreg 12.5mg  bid. Has had some PVCs on telemetry, no significant NSVT or sustained arrhythmias.  - +UTI this admission  - discussed increased oral hydration, lower his home diuretic to 20mg  daily.  - will lower his coreg to 6.25mg  bid given first degree av block - plan for 2 week zio patch to be placed prior to discharge.  - would ambulate patient today, if ongoing symptoms would give NS . If does ok would be ok for discharge today.   2.Chronic HFrEF/NICM -11/2022 echo: LVE 25%, mild RV dysfunction - does not appear volume overloaded - with dizziness/falls with standing lower his torsemide to 20mg  daily - with first degree av block and dizziness lower coreg to 6.25mg  bid.  - medical therapy limited by dizziness, CKD.   3. HTN - accept higher bp's at this time in setting of dizziness/falls while standing.  4.UTI - per primary team  Dina Rich MD

## 2022-11-14 NOTE — Progress Notes (Signed)
PROGRESS NOTE    Patient: Michael [name]                            PCP: Benita Stabile, MD                    DOB: 1955/04/25            DOA: 11/13/2022 [id number]             DOS: 11/14/2022, 12:11 PM   LOS: 0 days   Date of Service: The patient was seen and examined on 11/14/2022  Subjective:   The patient was seen and examined this morning. Hemodynamically stable. No issues overnight .  Brief Narrative:   Mr. Qian has above PMHx.  HFrEF, EJF 25%, CAD, last catheter 10/20/2021, moderate obstructive RCA, s/p right cardiac cath January 2023, chronic iron deficiency anemia, HTN, HLD, COPD, stage III CKD,... Presented once again with presyncope episode.   Was hospital 08/2022 with CHF and orthostatic hypotension. Palliative care recommended. Patient admitted 11/04/22 with 2 episoded of near syncope while walking, BP elevated on admission 177/111, BNP chronic elevated 4500, hypokalemia replaced. He signed out AMA.   He returned yesterday with 2 episodes of syncope while walking.He took his morning meds 8 am and passed out around noon.  He becomes dizzy and collapses to the ground. Second episode occurred 1 hr later while walking. Says he was walking for about 5 min before it happened. Thinks he's out about 3 minutes and wakes up and is fine. Doesn't drink much water because it puts him in heart failure.  Lives with another man that helps him with shopping(he doesn't drive). Was found to be hypokalemic-K 3.3 today and UTI.orthostatic BP 141/99L, 128/99 sitting, 145/116 standing. Has had some abdominal pain and has know large gallstone with elevated LFT's in past. Tbili 1.4.      Assessment & Plan:   Principal Problem:   Near syncope Active Problems:   Hypokalemia   Chronic kidney disease (CKD), stage III (moderate) (HCC)   Tobacco use disorder   Prolonged QT interval   UTI (urinary tract infection)   CAD (coronary artery disease)   COPD (chronic obstructive pulmonary  disease) (HCC)   Acute on chronic combined systolic and diastolic CHF (congestive heart failure) (HCC)     Assessment and Plan: * Near syncope - -Admission, due to orthostatic hypotension,-on multiple meds EJ EF 25%, no evidence of CHF exacerbation Frequent PVCs,, 2-4 beat NSVT on telemetry, -Audiology consulted, appreciate further evaluation for possible ICD versus palliative care  - Last echo was on 5 October, 2024 w EJF 25% - UA is indicative of UTI, this could be contributing to his near syncopal episodes - Monitor on telemetry - EKG shows a heart rate of 80, sinus rhythm, QTc 509  - Orthostatic vitals every shift - Continue to monitor  Chronic kidney disease (CKD), stage III (moderate) (HCC) Stage IIIb CKD Lab Results  Component Value Date   CREATININE 2.09 (H) 11/14/2022   CREATININE 1.96 (H) 11/13/2022   CREATININE 2.04 (H) 11/05/2022   Monitoring closely in setting of advanced heart failure  Hypokalemia -Monitoring closely - Replace and recheck  Acute on chronic combined systolic and diastolic CHF (congestive heart failure) (HCC) -Last cath 10/20/2021, revealing mid to moderate CAD,-on medical management - Last echo showed cardiomyopathy with ejection fraction of 25% 11/05/2022 - Continue statin, beta-blocker, ARB -With known CKD stage III, -  Holding torsemide at this time - Continue to monitor  -I's and O's, daily weight  COPD (chronic obstructive pulmonary disease) (HCC) - Continue Breztri - No evidence of COPD exacerbation at this time - Counseled on importance of smoking cessation  CAD (coronary artery disease) Last cath 10/20/2021, moderate CAD, with medical management recommendation -Denies any chest pain - Continue statin, beta-blocker, ARB, Plavix, Imdur, - Troponin downtrending from recent hospitalization from 96-88 - Monitor on telemetry   UTI (urinary tract infection) - UA indicative of UTI - Urine culture pending - Rocephin started in the  ED - Continue Rocephin  Prolonged QT interval - Likely related to hypokalemia - Replace potassium and recheck - Monitor on telemetry  Tobacco use disorder - Working on quitting - Down to 3 cigarettes/day - No nicotine patch at this time     ----------------------------------------------------------------------------------------------------------------------------------------------- Nutritional status:  The patient's BMI is: Body mass index is 20.15 kg/m. I agree with the assessment and plan as outlined --------------------------------------------------------------------------------------------------------------------------------------------  DVT prophylaxis:  heparin injection 5,000 Units Start: 11/14/22 0600 SCDs Start: 11/13/22 2243   Code Status:   Code Status: Limited: Do not attempt resuscitation (DNR) -DNR-LIMITED -Do Not Intubate/DNI   Family Communication: No family member present at bedside- attempt will be made to update daily  -Advance care planning has been discussed.   Admission status:   Status is: Observation The patient remains OBS appropriate and will d/c before 2 midnights.   Disposition: From  - home             Planning for discharge in 1-2 days: to Home   Procedures:   No admission procedures for hospital encounter.   Antimicrobials:  Anti-infectives (From admission, onward)    Start     Dose/Rate Route Frequency Ordered Stop   11/14/22 2000  cefTRIAXone (ROCEPHIN) 1 g in sodium chloride 0.9 % 100 mL IVPB        1 g 200 mL/hr over 30 Minutes Intravenous Every 24 hours 11/13/22 2057     11/13/22 2030  cefTRIAXone (ROCEPHIN) 1 g in sodium chloride 0.9 % 100 mL IVPB        1 g 200 mL/hr over 30 Minutes Intravenous  Once 11/13/22 2021 11/13/22 2141        Medication:   atorvastatin  40 mg Oral QPM   carvedilol  6.25 mg Oral BID WC   clopidogrel  75 mg Oral Daily   feeding supplement  237 mL Oral BID BM   heparin  5,000 Units  Subcutaneous Q8H   losartan  25 mg Oral Daily   mometasone-formoterol  2 puff Inhalation BID   potassium chloride  40 mEq Oral Daily   [START ON 11/15/2022] torsemide  20 mg Oral Daily   umeclidinium bromide  1 puff Inhalation Daily    acetaminophen **OR** acetaminophen, morphine injection, oxyCODONE, promethazine   Objective:   Vitals:   11/14/22 0100 11/14/22 0439 11/14/22 0727 11/14/22 1035  BP: (!) 144/84 (!) 163/93  (!) 156/91  Pulse: 82 65  80  Resp: 18 18  16   Temp: 98.4 F (36.9 C) (!) 97.5 F (36.4 C)  98.6 F (37 C)  TempSrc: Oral Oral  Oral  SpO2: 97% 99% 97% 96%  Weight:      Height:        Intake/Output Summary (Last 24 hours) at 11/14/2022 1211 Last data filed at 11/14/2022 0900 Gross per 24 hour  Intake 240 ml  Output --  Net 240 ml  Filed Weights   11/13/22 1742 11/13/22 2238  Weight: 74.4 kg 67.4 kg     Physical examination:   Constitution:  Alert, cooperative, no distress,  Appears calm and comfortable  Psychiatric:   Normal and stable mood and affect, cognition intact,   HEENT:      Deformed nasolabial structure due to previous cancer  normocephalic, PERRL, otherwise with in Normal limits  Chest:         Chest symmetric Cardio vascular:  S1/S2, RRR, No murmure, No Rubs or Gallops  pulmonary: Clear to auscultation bilaterally, respirations unlabored, negative wheezes / crackles Abdomen: Soft, non-tender, non-distended, bowel sounds,no masses, no organomegaly Muscular skeletal: Limited exam - in bed, able to move all 4 extremities,   Neuro: CNII-XII intact. , normal motor and sensation, reflexes intact  Extremities: No pitting edema lower extremities, +2 pulses  Skin: Dry, warm to touch, negative for any Rashes, No open wounds Wounds: per nursing documentation   ------------------------------------------------------------------------------------------------------------------------------------------    LABs:     Latest Ref Rng & Units  11/14/2022    4:28 AM 11/13/2022    5:53 PM 11/05/2022    6:31 AM  CBC  WBC 4.0 - 10.5 K/uL 7.2  7.3  6.3   Hemoglobin 13.0 - 17.0 g/dL 21.3  08.6  57.8   Hematocrit 39.0 - 52.0 % 37.6  38.5  36.6   Platelets 150 - 400 K/uL 303  299  253       Latest Ref Rng & Units 11/14/2022    4:28 AM 11/13/2022    5:53 PM 11/05/2022    6:31 AM  CMP  Glucose 70 - 99 mg/dL 469  629  528   BUN 8 - 23 mg/dL 29  27  31    Creatinine 0.61 - 1.24 mg/dL 4.13  2.44  0.10   Sodium 135 - 145 mmol/L 140  138  141   Potassium 3.5 - 5.1 mmol/L 3.3  3.2  3.5   Chloride 98 - 111 mmol/L 107  108  108   CO2 22 - 32 mmol/L 23  23  23    Calcium 8.9 - 10.3 mg/dL 8.6  8.7  8.8   Total Protein 6.5 - 8.1 g/dL 6.7   6.6   Total Bilirubin 0.3 - 1.2 mg/dL 1.4   2.4   Alkaline Phos 38 - 126 U/L 123   137   AST 15 - 41 U/L 26   31   ALT 0 - 44 U/L 23   27        Micro Results No results found for this or any previous visit (from the past 240 hour(s)).  Radiology Reports CT HEAD WO CONTRAST ( )  Result Date: 11/13/2022 CLINICAL DATA:  Head trauma, minor (Age >= 65y) Syncope/presyncope, cerebrovascular cause suspected EXAM: CT HEAD WITHOUT CONTRAST TECHNIQUE: Contiguous axial images were obtained from the base of the skull through the vertex without intravenous contrast. RADIATION DOSE REDUCTION: This exam was performed according to the departmental dose-optimization program which includes automated exposure control, adjustment of the mA and/or kV according to patient size and/or use of iterative reconstruction technique. COMPARISON:  05/04/2022 FINDINGS: Brain: Extensive chronic small vessel disease and diffuse cerebral atrophy. Old lacunar infarcts in the left basal ganglia and internal capsule. No acute intracranial abnormality. Specifically, no hemorrhage, hydrocephalus, mass lesion, acute infarction, or significant intracranial injury. Vascular: No hyperdense vessel or unexpected calcification. Skull: No acute  calvarial abnormality. Sinuses/Orbits: No acute findings postoperative changes. Other: None IMPRESSION: Atrophy,  chronic microvascular disease. No acute intracranial abnormality. Old left basal ganglia and internal capsule lacunar infarcts. Electronically Signed   By: Charlett Nose M.D.   On: 11/13/2022 21:16    SIGNED: Kendell Bane, MD, FHM. FAAFP. [facility] - [facility] Time spent - 55 min.  In seeing, evaluating and examining the patient. Reviewing medical records, labs, drawn plan of care. [facility],  Pager (please use [email or web address] to page/ text) Please use Epic Secure Chat for non-urgent communication (7AM-7PM)  If 7PM-7AM, please contact night-coverage [email or web address], 11/14/2022, 12:11 PM

## 2022-11-14 NOTE — TOC Initial Note (Signed)
Transition of Care [facility]) - Initial/Assessment Note    Patient Details  Name: Michael [name] MRN: [id number] Date of Birth: March 16, 1955  Transition of Care [facility]) CM/SW Contact:    Elliot Gault, LCSW Phone Number: 11/14/2022, 11:39 AM  Clinical Narrative:                  Pt admitted from home. Pt known to TOC from previous admissions. Spoke with pt today to assess. Pt continues to reside with his friends and plans to return home at dc. Pt states he is able to get to appointments and obtain medication as needed.   SDOH addressed. TOC will follow and assist if needs arise.  Expected Discharge Plan: Home/Self Care Barriers to Discharge: Continued Medical Work up   Patient Goals and CMS Choice Patient states their goals for this hospitalization and ongoing recovery are:: return home          Expected Discharge Plan and Services In-house Referral: Clinical Social Work     Living arrangements for the past 2 months: Single Family Home                                      Prior Living Arrangements/Services Living arrangements for the past 2 months: Single Family Home Lives with:: Friends Patient language and need for interpreter reviewed:: Yes Do you feel safe going back to the place where you live?: Yes      Need for Family Participation in Patient Care: No (Comment) Care giver support system in place?: Yes (comment)   Criminal Activity/Legal Involvement Pertinent to Current Situation/Hospitalization: No - Comment as needed  Activities of Daily Living   ADL Screening (condition at time of admission) Independently performs ADLs?: Yes (appropriate for developmental age) Is the patient deaf or have difficulty hearing?: No Does the patient have difficulty seeing, even when wearing glasses/contacts?: No Does the patient have difficulty concentrating, remembering, or making decisions?: No  Permission Sought/Granted                  Emotional  Assessment   Attitude/Demeanor/Rapport: Engaged Affect (typically observed): Pleasant Orientation: : Oriented to Self, Oriented to Place, Oriented to  Time, Oriented to Situation Alcohol / Substance Use: Not Applicable Psych Involvement: No (comment)  Admission diagnosis:  Hypokalemia [E87.6] Elevated blood pressure reading [R03.0] Acute UTI [N39.0] Near syncope [R55] Other cardiomyopathy (HCC) [I42.8] Stage 3b chronic kidney disease (HCC) [N18.32] Patient Active Problem List   Diagnosis Date Noted   Near syncope 11/04/2022   Calculus of gallbladder without cholecystitis without obstruction 08/10/2022   Elevated troponin level not due myocardial infarction 07/29/2022   Acute on chronic combined systolic and diastolic CHF (congestive heart failure) (HCC) 07/24/2022   History of malignant neoplasm of skin 05/17/2022   Nicotine dependence, cigarettes, uncomplicated 05/17/2022   Noncompliance with treatment 05/17/2022   Orthostatic hypotension 05/17/2022   Vitamin D deficiency 05/17/2022   Troponin level elevated 04/29/2022   Elevated brain natriuretic peptide (BNP) level 04/29/2022   Solitary pulmonary nodule 04/26/2022   First degree heart block 04/12/2022   Fracture of rib 02/27/2022   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   COPD (chronic obstructive pulmonary disease) (HCC) 11/03/2021   Acute non-ST segment elevation myocardial infarction (HCC) 11/03/2021   Aneurysm of infrarenal abdominal aorta (HCC) 11/03/2021   Functional gait abnormality 11/03/2021   Hypoalbuminemia 11/03/2021   Hypomagnesemia 11/03/2021   Mixed hyperlipidemia  11/03/2021   Moderate protein-calorie malnutrition (HCC) 11/03/2021   Physical deconditioning 11/03/2021   Proteinuria 11/03/2021   Transaminitis 10/23/2021   Pressure injury of skin 10/22/2021   Type 2 MI (myocardial infarction) (HCC) 10/20/2021   Myocardial infarction (HCC) 10/20/2021   Atrophy of pancreas 08/11/2021   Mass of colon  08/06/2021   Preoperative cardiovascular examination 08/03/2021   CAD (coronary artery disease) 08/03/2021   Chronic kidney disease, stage 3b (HCC) 08/03/2021   Hardening of the aorta (main artery of the heart) (HCC) 08/03/2021   Rectal hemorrhage 06/16/2021   History of adenomatous polyp of colon 06/16/2021   Carotid stenosis, asymptomatic, right 02/15/2021   Occlusion and stenosis of right carotid artery 02/15/2021   Basal cell carcinoma of [place], right 12/06/2019   Incisional hernia 11/15/2019   Elevated PSA 06/19/2019   Benign prostatic hyperplasia with urinary obstruction 10/29/2018   CAP (community acquired pneumonia) 05/09/2018   Hypertensive emergency 05/09/2018   Chronic combined systolic and diastolic CHF (congestive heart failure) (HCC) 05/09/2018   Lobar pneumonia (HCC) 05/09/2018   Retention of urine 11/27/2017   Vasovagal syncope 09/17/2017   Sepsis due to urinary tract infection (HCC) 09/01/2017   Syncope and collapse 07/05/2017   COPD with acute exacerbation (HCC) 07/05/2017   Acute kidney injury superimposed on CKD (HCC) 07/05/2017   Urinary tract infectious disease 07/05/2017   Leukocytosis 07/05/2017   Incomplete bladder emptying 07/05/2017   UTI (urinary tract infection) 07/05/2017   Disorder of carotid artery (HCC) 06/25/2017   Orthostatic syncope    Syncope due to orthostatic hypotension 06/24/2017   Cardiac murmur 06/24/2017   Prolonged QT interval 06/24/2017   Imaging of gastrointestinal tract abnormal 05/25/2017   Basal cell carcinoma of eyelid 05/15/2017   Basal cell carcinoma (BCC) of nostril 05/12/2017   Skin lesion of face 05/03/2017   Other specified disorders of nose and nasal sinuses 05/03/2017   Periapical abscess 04/04/2017   Iron deficiency anemia 04/04/2017   Essential hypertension 04/02/2017   Anemia of chronic disease 04/02/2017   Acute hypoxemic respiratory failure (HCC) 04/02/2017   Lactic acidosis 04/02/2017   Tobacco use disorder  04/02/2017   Elevated troponin 04/02/2017   Thrombocytosis 04/02/2017   Hypokalemia 04/02/2017   Unspecified diastolic (congestive) heart failure (HCC) 04/02/2017   Nasal lesion    PCP:  Benita Stabile, MD Pharmacy:   [facility] - [place], [place] - [address] [place] [address] Phone: [phone] Fax: [phone]     Social Determinants of Health (SDOH) Social History: SDOH Screenings   Food Insecurity: No Food Insecurity (11/13/2022)  Housing: Medium Risk (11/13/2022)  Transportation Needs: No Transportation Needs (11/13/2022)  Utilities: Not At Risk (11/13/2022)  Alcohol Screen: Low Risk  (08/19/2022)  Depression (PHQ2-9): Low Risk  (08/19/2022)  Financial Resource Strain: Low Risk  (08/19/2022)  Physical Activity: Inactive (08/19/2022)  Social Connections: Unknown (08/19/2022)  Stress: No Stress Concern Present (08/19/2022)  Tobacco Use: Medium Risk (11/13/2022)  Health Literacy: Inadequate Health Literacy (08/19/2022)   SDOH Interventions: Housing Interventions: Inpatient TOC, Intervention Not Indicated   Readmission Risk Interventions    07/25/2022    1:09 PM 04/26/2022   11:46 AM 10/26/2021    2:57 PM  Readmission Risk Prevention Plan  Transportation Screening Complete Complete Complete  PCP or Specialist Appt within 3-5 Days   Complete  HRI or Home Care Consult  Complete Complete  Social Work Consult for Recovery Care Planning/Counseling  Complete Complete  Palliative Care  Screening  Not Applicable Not Applicable  Medication Review (RN Care Manager) Complete Complete Complete  PCP or Specialist appointment within 3-5 days of discharge Not Complete    HRI or Home Care Consult Complete    SW Recovery Care/Counseling Consult Complete    Palliative Care Screening Complete    Skilled Nursing Facility Not Applicable

## 2022-11-14 NOTE — Assessment & Plan Note (Addendum)
- -  Admission, due to orthostatic hypotension,-on multiple meds EJ EF 25%, no evidence of CHF exacerbation Frequent PVCs,, 2-4 beat NSVT on telemetry, -Audiology consulted, appreciate further evaluation for possible ICD versus palliative care  - Last echo was on 5 October, 2024 w EJF 25% - UA is indicative of UTI, this could be contributing to his near syncopal episodes - Monitor on telemetry - EKG shows a heart rate of 80, sinus rhythm, QTc 509  - Orthostatic vitals every shift - Continue to monitor

## 2022-11-15 DIAGNOSIS — Z515 Encounter for palliative care: Secondary | ICD-10-CM

## 2022-11-15 DIAGNOSIS — Z7189 Other specified counseling: Secondary | ICD-10-CM

## 2022-11-15 DIAGNOSIS — R42 Dizziness and giddiness: Secondary | ICD-10-CM | POA: Diagnosis not present

## 2022-11-15 DIAGNOSIS — R55 Syncope and collapse: Secondary | ICD-10-CM | POA: Diagnosis not present

## 2022-11-15 LAB — BASIC METABOLIC PANEL
Anion gap: 10 (ref 5–15)
BUN: 36 mg/dL — ABNORMAL HIGH (ref 8–23)
CO2: 22 mmol/L (ref 22–32)
Calcium: 8.7 mg/dL — ABNORMAL LOW (ref 8.9–10.3)
Chloride: 104 mmol/L (ref 98–111)
Creatinine, Ser: 2.41 mg/dL — ABNORMAL HIGH (ref 0.61–1.24)
GFR, Estimated: 29 mL/min — ABNORMAL LOW (ref >60)
Glucose, Bld: 106 mg/dL — ABNORMAL HIGH (ref 70–99)
Potassium: 5 mmol/L (ref 3.5–5.1)
Sodium: 136 mmol/L (ref 135–145)

## 2022-11-15 MED ORDER — LACTINEX PO CHEW: 1.0000 | CHEWABLE_TABLET | Freq: Three times a day (TID) | ORAL | 0 refills | Status: DC

## 2022-11-15 MED ORDER — TORSEMIDE 20 MG PO TABS: 20.0000 mg | ORAL_TABLET | Freq: Every day | ORAL | 1 refills | Status: DC

## 2022-11-15 MED ORDER — CLOPIDOGREL BISULFATE 75 MG PO TABS: 75.0000 mg | ORAL_TABLET | Freq: Every day | ORAL | 1 refills | Status: DC

## 2022-11-15 MED ORDER — CARVEDILOL 6.25 MG PO TABS: 6.2500 mg | ORAL_TABLET | Freq: Two times a day (BID) | ORAL | 1 refills | Status: DC

## 2022-11-15 MED ORDER — LOSARTAN POTASSIUM 25 MG PO TABS: 25.0000 mg | ORAL_TABLET | Freq: Every day | ORAL | 2 refills | Status: DC

## 2022-11-15 MED ORDER — CIPROFLOXACIN HCL 500 MG PO TABS
500.0000 mg | ORAL_TABLET | Freq: Two times a day (BID) | ORAL | 0 refills | Status: DC
Start: 2022-11-15 — End: 2022-11-25

## 2022-11-15 MED ORDER — SODIUM CHLORIDE 0.9 % IV SOLN: INTRAVENOUS | Status: DC

## 2022-11-15 NOTE — Discharge Summary (Signed)
Physician Discharge Summary   Patient: Michael [name] MRN: [id number] DOB: 1955/09/18  Admit date:     11/13/2022  Discharge date: 11/15/22  Discharge Physician: Kendell Bane   PCP: Benita Stabile, MD   Recommendations at discharge:   Follow-up with PCP in 1-2 weeks (to PCP please follow-up with the final urine cultures) Follow-up with a cardiologist in 2 weeks Cardiology has placed home Zio patch (was placed 11/14/2022) Cardiac medication Coreg has been reduced Torsemide daily Complete the course of antibiotics  Discharge Diagnoses: Principal Problem:   Near syncope Active Problems:   Hypokalemia   Chronic kidney disease (CKD), stage III (moderate) (HCC)   Tobacco use disorder   Prolonged QT interval   UTI (urinary tract infection)   CAD (coronary artery disease)   COPD (chronic obstructive pulmonary disease) (HCC)   Acute on chronic combined systolic and diastolic CHF (congestive heart failure) (HCC)  Resolved Problems:   * No resolved hospital problems. [facility] Course: Mr. Huish has above PMHx.  HFrEF, EJF 25%, CAD, last catheter 10/20/2021, moderate obstructive RCA, s/p right cardiac cath January 2023, chronic iron deficiency anemia, HTN, HLD, COPD, stage III CKD,... Presented once again with presyncope episode.   Was hospital 08/2022 with CHF and orthostatic hypotension. Palliative care recommended. Patient admitted 11/04/22 with 2 episoded of near syncope while walking, BP elevated on admission 177/111, BNP chronic elevated 4500, hypokalemia replaced. He signed out AMA.   He returned yesterday with 2 episodes of syncope while walking.He took his morning meds 8 am and passed out around noon.  He becomes dizzy and collapses to the ground. Second episode occurred 1 hr later while walking. Says he was walking for about 5 min before it happened. Thinks he's out about 3 minutes and wakes up and is fine. Doesn't drink much water because it puts him in heart failure.   Lives with another man that helps him with shopping(he doesn't drive). Was found to be hypokalemic-K 3.3 today and UTI.orthostatic BP 141/99L, 128/99 sitting, 145/116 standing. Has had some abdominal pain and has know large gallstone with elevated LFT's in past. Tbili 1.4.    Assessment and Plan: * Near syncope - -Admission, due to orthostatic hypotension,-on multiple meds EJ EF 25%, no evidence of CHF exacerbation Frequent PVCs,, 2-4 beat NSVT on telemetry, -Audiology consulted, appreciate further evaluation for possible ICD versus palliative care  - Last echo was on 5 October, 2024 w EJF 25% - UA is indicative of UTI, this could be contributing to his near syncopal episodes - Monitor on telemetry - EKG shows a heart rate of 80, sinus rhythm, QTc 509  Home zio patch placed 11/14/2022, he will wear home monitor 2 weeks and f/u in cardiology clinic.  Coreg lowered, diuretic lowered as well.     Chronic kidney disease (CKD), stage III (moderate) (HCC) Stage IIIb CKD  Lab Results  Component Value Date   CREATININE 2.41 (H) 11/15/2022   CREATININE 2.09 (H) 11/14/2022   CREATININE 1.96 (H) 11/13/2022     Monitoring closely in setting of advanced heart failure  Hypokalemia -Monitoring closely - Replace and recheck  Acute on chronic combined systolic and diastolic CHF (congestive heart failure) (HCC) -Last cath 10/20/2021, revealing mid to moderate CAD,-on medical management - Last echo showed cardiomyopathy with ejection fraction of 25% 11/05/2022 - Continue statin, beta-blocker, ARB -With known CKD stage III, - Holding torsemide at this time - Continue to monitor  -I's and O's, daily weight  COPD (  Physician Discharge Summary   Patient: Michael [name] MRN: [id number] DOB: 1955/09/18  Admit date:     11/13/2022  Discharge date: 11/15/22  Discharge Physician: Kendell Bane   PCP: Benita Stabile, MD   Recommendations at discharge:   Follow-up with PCP in 1-2 weeks (to PCP please follow-up with the final urine cultures) Follow-up with a cardiologist in 2 weeks Cardiology has placed home Zio patch (was placed 11/14/2022) Cardiac medication Coreg has been reduced Torsemide daily Complete the course of antibiotics  Discharge Diagnoses: Principal Problem:   Near syncope Active Problems:   Hypokalemia   Chronic kidney disease (CKD), stage III (moderate) (HCC)   Tobacco use disorder   Prolonged QT interval   UTI (urinary tract infection)   CAD (coronary artery disease)   COPD (chronic obstructive pulmonary disease) (HCC)   Acute on chronic combined systolic and diastolic CHF (congestive heart failure) (HCC)  Resolved Problems:   * No resolved hospital problems. [facility] Course: Mr. Huish has above PMHx.  HFrEF, EJF 25%, CAD, last catheter 10/20/2021, moderate obstructive RCA, s/p right cardiac cath January 2023, chronic iron deficiency anemia, HTN, HLD, COPD, stage III CKD,... Presented once again with presyncope episode.   Was hospital 08/2022 with CHF and orthostatic hypotension. Palliative care recommended. Patient admitted 11/04/22 with 2 episoded of near syncope while walking, BP elevated on admission 177/111, BNP chronic elevated 4500, hypokalemia replaced. He signed out AMA.   He returned yesterday with 2 episodes of syncope while walking.He took his morning meds 8 am and passed out around noon.  He becomes dizzy and collapses to the ground. Second episode occurred 1 hr later while walking. Says he was walking for about 5 min before it happened. Thinks he's out about 3 minutes and wakes up and is fine. Doesn't drink much water because it puts him in heart failure.   Lives with another man that helps him with shopping(he doesn't drive). Was found to be hypokalemic-K 3.3 today and UTI.orthostatic BP 141/99L, 128/99 sitting, 145/116 standing. Has had some abdominal pain and has know large gallstone with elevated LFT's in past. Tbili 1.4.    Assessment and Plan: * Near syncope - -Admission, due to orthostatic hypotension,-on multiple meds EJ EF 25%, no evidence of CHF exacerbation Frequent PVCs,, 2-4 beat NSVT on telemetry, -Audiology consulted, appreciate further evaluation for possible ICD versus palliative care  - Last echo was on 5 October, 2024 w EJF 25% - UA is indicative of UTI, this could be contributing to his near syncopal episodes - Monitor on telemetry - EKG shows a heart rate of 80, sinus rhythm, QTc 509  Home zio patch placed 11/14/2022, he will wear home monitor 2 weeks and f/u in cardiology clinic.  Coreg lowered, diuretic lowered as well.     Chronic kidney disease (CKD), stage III (moderate) (HCC) Stage IIIb CKD  Lab Results  Component Value Date   CREATININE 2.41 (H) 11/15/2022   CREATININE 2.09 (H) 11/14/2022   CREATININE 1.96 (H) 11/13/2022     Monitoring closely in setting of advanced heart failure  Hypokalemia -Monitoring closely - Replace and recheck  Acute on chronic combined systolic and diastolic CHF (congestive heart failure) (HCC) -Last cath 10/20/2021, revealing mid to moderate CAD,-on medical management - Last echo showed cardiomyopathy with ejection fraction of 25% 11/05/2022 - Continue statin, beta-blocker, ARB -With known CKD stage III, - Holding torsemide at this time - Continue to monitor  -I's and O's, daily weight  COPD (  Physician Discharge Summary   Patient: Michael [name] MRN: [id number] DOB: 1955/09/18  Admit date:     11/13/2022  Discharge date: 11/15/22  Discharge Physician: Kendell Bane   PCP: Benita Stabile, MD   Recommendations at discharge:   Follow-up with PCP in 1-2 weeks (to PCP please follow-up with the final urine cultures) Follow-up with a cardiologist in 2 weeks Cardiology has placed home Zio patch (was placed 11/14/2022) Cardiac medication Coreg has been reduced Torsemide daily Complete the course of antibiotics  Discharge Diagnoses: Principal Problem:   Near syncope Active Problems:   Hypokalemia   Chronic kidney disease (CKD), stage III (moderate) (HCC)   Tobacco use disorder   Prolonged QT interval   UTI (urinary tract infection)   CAD (coronary artery disease)   COPD (chronic obstructive pulmonary disease) (HCC)   Acute on chronic combined systolic and diastolic CHF (congestive heart failure) (HCC)  Resolved Problems:   * No resolved hospital problems. [facility] Course: Mr. Huish has above PMHx.  HFrEF, EJF 25%, CAD, last catheter 10/20/2021, moderate obstructive RCA, s/p right cardiac cath January 2023, chronic iron deficiency anemia, HTN, HLD, COPD, stage III CKD,... Presented once again with presyncope episode.   Was hospital 08/2022 with CHF and orthostatic hypotension. Palliative care recommended. Patient admitted 11/04/22 with 2 episoded of near syncope while walking, BP elevated on admission 177/111, BNP chronic elevated 4500, hypokalemia replaced. He signed out AMA.   He returned yesterday with 2 episodes of syncope while walking.He took his morning meds 8 am and passed out around noon.  He becomes dizzy and collapses to the ground. Second episode occurred 1 hr later while walking. Says he was walking for about 5 min before it happened. Thinks he's out about 3 minutes and wakes up and is fine. Doesn't drink much water because it puts him in heart failure.   Lives with another man that helps him with shopping(he doesn't drive). Was found to be hypokalemic-K 3.3 today and UTI.orthostatic BP 141/99L, 128/99 sitting, 145/116 standing. Has had some abdominal pain and has know large gallstone with elevated LFT's in past. Tbili 1.4.    Assessment and Plan: * Near syncope - -Admission, due to orthostatic hypotension,-on multiple meds EJ EF 25%, no evidence of CHF exacerbation Frequent PVCs,, 2-4 beat NSVT on telemetry, -Audiology consulted, appreciate further evaluation for possible ICD versus palliative care  - Last echo was on 5 October, 2024 w EJF 25% - UA is indicative of UTI, this could be contributing to his near syncopal episodes - Monitor on telemetry - EKG shows a heart rate of 80, sinus rhythm, QTc 509  Home zio patch placed 11/14/2022, he will wear home monitor 2 weeks and f/u in cardiology clinic.  Coreg lowered, diuretic lowered as well.     Chronic kidney disease (CKD), stage III (moderate) (HCC) Stage IIIb CKD  Lab Results  Component Value Date   CREATININE 2.41 (H) 11/15/2022   CREATININE 2.09 (H) 11/14/2022   CREATININE 1.96 (H) 11/13/2022     Monitoring closely in setting of advanced heart failure  Hypokalemia -Monitoring closely - Replace and recheck  Acute on chronic combined systolic and diastolic CHF (congestive heart failure) (HCC) -Last cath 10/20/2021, revealing mid to moderate CAD,-on medical management - Last echo showed cardiomyopathy with ejection fraction of 25% 11/05/2022 - Continue statin, beta-blocker, ARB -With known CKD stage III, - Holding torsemide at this time - Continue to monitor  -I's and O's, daily weight  COPD (  Physician Discharge Summary   Patient: Michael [name] MRN: [id number] DOB: 1955/09/18  Admit date:     11/13/2022  Discharge date: 11/15/22  Discharge Physician: Kendell Bane   PCP: Benita Stabile, MD   Recommendations at discharge:   Follow-up with PCP in 1-2 weeks (to PCP please follow-up with the final urine cultures) Follow-up with a cardiologist in 2 weeks Cardiology has placed home Zio patch (was placed 11/14/2022) Cardiac medication Coreg has been reduced Torsemide daily Complete the course of antibiotics  Discharge Diagnoses: Principal Problem:   Near syncope Active Problems:   Hypokalemia   Chronic kidney disease (CKD), stage III (moderate) (HCC)   Tobacco use disorder   Prolonged QT interval   UTI (urinary tract infection)   CAD (coronary artery disease)   COPD (chronic obstructive pulmonary disease) (HCC)   Acute on chronic combined systolic and diastolic CHF (congestive heart failure) (HCC)  Resolved Problems:   * No resolved hospital problems. [facility] Course: Mr. Huish has above PMHx.  HFrEF, EJF 25%, CAD, last catheter 10/20/2021, moderate obstructive RCA, s/p right cardiac cath January 2023, chronic iron deficiency anemia, HTN, HLD, COPD, stage III CKD,... Presented once again with presyncope episode.   Was hospital 08/2022 with CHF and orthostatic hypotension. Palliative care recommended. Patient admitted 11/04/22 with 2 episoded of near syncope while walking, BP elevated on admission 177/111, BNP chronic elevated 4500, hypokalemia replaced. He signed out AMA.   He returned yesterday with 2 episodes of syncope while walking.He took his morning meds 8 am and passed out around noon.  He becomes dizzy and collapses to the ground. Second episode occurred 1 hr later while walking. Says he was walking for about 5 min before it happened. Thinks he's out about 3 minutes and wakes up and is fine. Doesn't drink much water because it puts him in heart failure.   Lives with another man that helps him with shopping(he doesn't drive). Was found to be hypokalemic-K 3.3 today and UTI.orthostatic BP 141/99L, 128/99 sitting, 145/116 standing. Has had some abdominal pain and has know large gallstone with elevated LFT's in past. Tbili 1.4.    Assessment and Plan: * Near syncope - -Admission, due to orthostatic hypotension,-on multiple meds EJ EF 25%, no evidence of CHF exacerbation Frequent PVCs,, 2-4 beat NSVT on telemetry, -Audiology consulted, appreciate further evaluation for possible ICD versus palliative care  - Last echo was on 5 October, 2024 w EJF 25% - UA is indicative of UTI, this could be contributing to his near syncopal episodes - Monitor on telemetry - EKG shows a heart rate of 80, sinus rhythm, QTc 509  Home zio patch placed 11/14/2022, he will wear home monitor 2 weeks and f/u in cardiology clinic.  Coreg lowered, diuretic lowered as well.     Chronic kidney disease (CKD), stage III (moderate) (HCC) Stage IIIb CKD  Lab Results  Component Value Date   CREATININE 2.41 (H) 11/15/2022   CREATININE 2.09 (H) 11/14/2022   CREATININE 1.96 (H) 11/13/2022     Monitoring closely in setting of advanced heart failure  Hypokalemia -Monitoring closely - Replace and recheck  Acute on chronic combined systolic and diastolic CHF (congestive heart failure) (HCC) -Last cath 10/20/2021, revealing mid to moderate CAD,-on medical management - Last echo showed cardiomyopathy with ejection fraction of 25% 11/05/2022 - Continue statin, beta-blocker, ARB -With known CKD stage III, - Holding torsemide at this time - Continue to monitor  -I's and O's, daily weight  COPD (  narrowing. 2. Moderate-to-large amount of left-sided atherosclerotic plaque, morphologically progressed compared to the 2019 examination though not resulting in elevated peak systolic velocities to suggest a hemodynamically significant stenosis. If clinical concern persists, further evaluation with CTA could be performed as indicated. Electronically Signed   By: Simonne Come M.D.   On: 11/05/2022 16:03   ECHOCARDIOGRAM LIMITED  Result Date: 11/05/2022    ECHOCARDIOGRAM LIMITED REPORT   Patient Name:   KENDRELL LOTTMAN Date of Exam: 11/05/2022 Medical Rec #:  [id number]     Height:       72.0 in  Accession #:    [id number]    Weight:       144.0 lb Date of Birth:  1955-10-17     BSA:          1.853 m Patient Age:    67 years      BP:           126/87 mmHg Patient Gender: M             HR:           62 bpm. Exam Location:  Inpatient Procedure: Cardiac Doppler, Color Doppler and Limited Echo Indications:    Syncope R55  History:        Patient has prior history of Echocardiogram examinations, most                 recent 08/16/2022. Cardiomyopathy, CAD, COPD; Risk                 Factors:Hypertension.  Sonographer:    Harriette Bouillon RDCS Referring Phys: [id number] OLADAPO ADEFESO IMPRESSIONS  1. No change in severely reduced EF from TTE done 08/16/22 Limited echo No mural apical thrombus. Left ventricular ejection fraction, by estimation, is 25%. The left ventricle demonstrates global hypokinesis. The left ventricular internal cavity size was  severely dilated.  2. Right ventricular systolic function is mildly reduced. The right ventricular size is mildly enlarged.  3. The mitral valve is abnormal. Mild mitral valve regurgitation. FINDINGS  Left Ventricle: No change in severely reduced EF from TTE done 08/16/22 Limited echo No mural apical thrombus. Left ventricular ejection fraction, by estimation, is 25%. The left ventricle demonstrates global hypokinesis. The left ventricular internal cavity size was severely dilated. Right Ventricle: The right ventricular size is mildly enlarged. Right ventricular systolic function is mildly reduced. Pericardium: There is no evidence of pericardial effusion. Mitral Valve: The mitral valve is abnormal. There is mild thickening of the mitral valve leaflet(s). Mild mitral valve regurgitation. Tricuspid Valve: Tricuspid valve regurgitation is mild. LEFT VENTRICLE PLAX 2D LVIDd:         6.80 cm LVIDs:         6.50 cm LV PW:         0.90 cm LV IVS:        0.90 cm  Charlton Haws MD Electronically signed by Charlton Haws MD Signature Date/Time: 11/05/2022/1:33:22 PM    Final    DG Chest  Port 1 View  Result Date: 11/04/2022 CLINICAL DATA:  Syncope.  Shortness of breath. EXAM: PORTABLE CHEST 1 VIEW COMPARISON:  08/15/2022 FINDINGS: Chronic cardiomegaly. Stable mediastinal contours. Previous lower lobe opacities have resolved. Chronic interstitial coarsening. No convincing pulmonary edema. No pneumothorax. No large pleural effusion. IMPRESSION: 1. Chronic cardiomegaly. 2. Previous bilateral airspace opacities have resolved. No acute findings. Electronically Signed   By: Narda Rutherford M.D.   On: 11/04/2022 19:05    Microbiology: Results for orders

## 2022-11-15 NOTE — Progress Notes (Addendum)
Telemetry reviewed, no significant arrhythmias. Home zio patch placed yesterday. No additional cardiology recs at this time, he will wear home monitor 2 weeks and f/u in cardiology clinic. Coreg lowered, diuretic lowered as well. We will sign off inpatient care.    Dominga Ferry MD

## 2022-11-15 NOTE — Progress Notes (Signed)
Palliative: Chart review completed.  Michael [name] is well-known to the palliative team.  He is frequently hospitalized with heart failure exacerbations.  He is now DNR.  His goals remain set for continue to treat.  His heart failure issues are made worse by his living circumstance.  Transition of care team works closely during each admit to meet his needs.  No palliative needs at this time.  Face-to-face conference with transition of care team related to patient condition, needs, goals of care.  They share that Michael [name] is to discharge today.  No charge Lillia Carmel, NP Palliative medicine team Team phone [phone]

## 2022-11-15 NOTE — Plan of Care (Signed)

## 2022-11-16 LAB — URINE CULTURE: Culture: >=100000 — AB

## 2022-11-21 ENCOUNTER — Encounter (HOSPITAL_COMMUNITY): Payer: Self-pay

## 2022-11-21 ENCOUNTER — Other Ambulatory Visit: Payer: Self-pay

## 2022-11-21 ENCOUNTER — Emergency Department (HOSPITAL_COMMUNITY): Payer: 59

## 2022-11-21 ENCOUNTER — Inpatient Hospital Stay (HOSPITAL_COMMUNITY)
Admission: EM | Admit: 2022-11-21 | Discharge: 2022-11-25 | DRG: 291 | Disposition: A | Discharge status: Home / Self Care | Payer: 59 | Attending: Internal Medicine | Admitting: Internal Medicine

## 2022-11-21 DIAGNOSIS — E785 Hyperlipidemia, unspecified: Secondary | ICD-10-CM | POA: Diagnosis not present

## 2022-11-21 DIAGNOSIS — J9601 Acute respiratory failure with hypoxia: Secondary | ICD-10-CM | POA: Diagnosis not present

## 2022-11-21 DIAGNOSIS — E440 Moderate protein-calorie malnutrition: Secondary | ICD-10-CM | POA: Diagnosis not present

## 2022-11-21 DIAGNOSIS — J441 Chronic obstructive pulmonary disease with (acute) exacerbation: Secondary | ICD-10-CM | POA: Diagnosis present

## 2022-11-21 DIAGNOSIS — F17210 Nicotine dependence, cigarettes, uncomplicated: Secondary | ICD-10-CM | POA: Diagnosis not present

## 2022-11-21 DIAGNOSIS — I5023 Acute on chronic systolic (congestive) heart failure: Secondary | ICD-10-CM | POA: Diagnosis not present

## 2022-11-21 DIAGNOSIS — Z6821 Body mass index (BMI) 21.0-21.9, adult: Secondary | ICD-10-CM

## 2022-11-21 DIAGNOSIS — Z9049 Acquired absence of other specified parts of digestive tract: Secondary | ICD-10-CM | POA: Diagnosis not present

## 2022-11-21 DIAGNOSIS — N1832 Chronic kidney disease, stage 3b: Secondary | ICD-10-CM | POA: Diagnosis present

## 2022-11-21 DIAGNOSIS — I429 Cardiomyopathy, unspecified: Secondary | ICD-10-CM | POA: Diagnosis not present

## 2022-11-21 DIAGNOSIS — R030 Elevated blood-pressure reading, without diagnosis of hypertension: Secondary | ICD-10-CM | POA: Diagnosis not present

## 2022-11-21 DIAGNOSIS — I700 Atherosclerosis of aorta: Secondary | ICD-10-CM | POA: Diagnosis not present

## 2022-11-21 DIAGNOSIS — Z79899 Other long term (current) drug therapy: Secondary | ICD-10-CM | POA: Diagnosis not present

## 2022-11-21 DIAGNOSIS — I130 Hypertensive heart and chronic kidney disease with heart failure and stage 1 through stage 4 chronic kidney disease, or unspecified chronic kidney disease: Secondary | ICD-10-CM | POA: Diagnosis not present

## 2022-11-21 DIAGNOSIS — Z8589 Personal history of malignant neoplasm of other organs and systems: Secondary | ICD-10-CM

## 2022-11-21 DIAGNOSIS — I2489 Other forms of acute ischemic heart disease: Secondary | ICD-10-CM | POA: Diagnosis present

## 2022-11-21 DIAGNOSIS — F17200 Nicotine dependence, unspecified, uncomplicated: Secondary | ICD-10-CM | POA: Diagnosis present

## 2022-11-21 DIAGNOSIS — R55 Syncope and collapse: Secondary | ICD-10-CM | POA: Diagnosis not present

## 2022-11-21 DIAGNOSIS — J90 Pleural effusion, not elsewhere classified: Secondary | ICD-10-CM | POA: Diagnosis not present

## 2022-11-21 DIAGNOSIS — Z85828 Personal history of other malignant neoplasm of skin: Secondary | ICD-10-CM | POA: Diagnosis not present

## 2022-11-21 DIAGNOSIS — Z66 Do not resuscitate: Secondary | ICD-10-CM | POA: Diagnosis present

## 2022-11-21 DIAGNOSIS — I252 Old myocardial infarction: Secondary | ICD-10-CM

## 2022-11-21 DIAGNOSIS — J449 Chronic obstructive pulmonary disease, unspecified: Secondary | ICD-10-CM | POA: Diagnosis present

## 2022-11-21 DIAGNOSIS — Z720 Tobacco use: Secondary | ICD-10-CM | POA: Diagnosis present

## 2022-11-21 DIAGNOSIS — R6889 Other general symptoms and signs: Secondary | ICD-10-CM | POA: Diagnosis not present

## 2022-11-21 DIAGNOSIS — Z860101 Personal history of adenomatous and serrated colon polyps: Secondary | ICD-10-CM | POA: Diagnosis not present

## 2022-11-21 DIAGNOSIS — D509 Iron deficiency anemia, unspecified: Secondary | ICD-10-CM | POA: Diagnosis present

## 2022-11-21 DIAGNOSIS — Z716 Tobacco abuse counseling: Secondary | ICD-10-CM

## 2022-11-21 DIAGNOSIS — Z91148 Patient's other noncompliance with medication regimen for other reason: Secondary | ICD-10-CM

## 2022-11-21 DIAGNOSIS — I440 Atrioventricular block, first degree: Secondary | ICD-10-CM | POA: Diagnosis present

## 2022-11-21 DIAGNOSIS — I2510 Atherosclerotic heart disease of native coronary artery without angina pectoris: Secondary | ICD-10-CM | POA: Diagnosis present

## 2022-11-21 DIAGNOSIS — R531 Weakness: Secondary | ICD-10-CM | POA: Diagnosis not present

## 2022-11-21 DIAGNOSIS — Z743 Need for continuous supervision: Secondary | ICD-10-CM | POA: Diagnosis not present

## 2022-11-21 DIAGNOSIS — Z7902 Long term (current) use of antithrombotics/antiplatelets: Secondary | ICD-10-CM

## 2022-11-21 DIAGNOSIS — K219 Gastro-esophageal reflux disease without esophagitis: Secondary | ICD-10-CM | POA: Diagnosis not present

## 2022-11-21 DIAGNOSIS — I5032 Chronic diastolic (congestive) heart failure: Secondary | ICD-10-CM | POA: Insufficient documentation

## 2022-11-21 DIAGNOSIS — Z91199 Patient's noncompliance with other medical treatment and regimen due to unspecified reason: Secondary | ICD-10-CM

## 2022-11-21 DIAGNOSIS — J9621 Acute and chronic respiratory failure with hypoxia: Secondary | ICD-10-CM | POA: Diagnosis not present

## 2022-11-21 DIAGNOSIS — I472 Ventricular tachycardia, unspecified: Secondary | ICD-10-CM | POA: Diagnosis not present

## 2022-11-21 DIAGNOSIS — K7689 Other specified diseases of liver: Secondary | ICD-10-CM | POA: Diagnosis not present

## 2022-11-21 DIAGNOSIS — I6521 Occlusion and stenosis of right carotid artery: Secondary | ICD-10-CM | POA: Diagnosis not present

## 2022-11-21 DIAGNOSIS — I517 Cardiomegaly: Secondary | ICD-10-CM | POA: Diagnosis not present

## 2022-11-21 DIAGNOSIS — R404 Transient alteration of awareness: Secondary | ICD-10-CM | POA: Diagnosis not present

## 2022-11-21 DIAGNOSIS — I951 Orthostatic hypotension: Secondary | ICD-10-CM | POA: Diagnosis not present

## 2022-11-21 DIAGNOSIS — Z823 Family history of stroke: Secondary | ICD-10-CM

## 2022-11-21 DIAGNOSIS — I5020 Unspecified systolic (congestive) heart failure: Secondary | ICD-10-CM | POA: Diagnosis not present

## 2022-11-21 DIAGNOSIS — R42 Dizziness and giddiness: Secondary | ICD-10-CM | POA: Diagnosis not present

## 2022-11-21 DIAGNOSIS — Z87898 Personal history of other specified conditions: Secondary | ICD-10-CM | POA: Diagnosis not present

## 2022-11-21 LAB — BASIC METABOLIC PANEL
Anion gap: 6 (ref 5–15)
BUN: 28 mg/dL — ABNORMAL HIGH (ref 8–23)
CO2: 25 mmol/L (ref 22–32)
Calcium: 8.6 mg/dL — ABNORMAL LOW (ref 8.9–10.3)
Chloride: 109 mmol/L (ref 98–111)
Creatinine, Ser: 1.87 mg/dL — ABNORMAL HIGH (ref 0.61–1.24)
GFR, Estimated: 39 mL/min — ABNORMAL LOW (ref >60)
Glucose, Bld: 101 mg/dL — ABNORMAL HIGH (ref 70–99)
Potassium: 3.7 mmol/L (ref 3.5–5.1)
Sodium: 140 mmol/L (ref 135–145)

## 2022-11-21 LAB — URINALYSIS, ROUTINE W REFLEX MICROSCOPIC
Bacteria, UA: NONE SEEN
Bilirubin Urine: NEGATIVE
Glucose, UA: NEGATIVE mg/dL
Hgb urine dipstick: NEGATIVE
Ketones, ur: NEGATIVE mg/dL
Leukocytes,Ua: NEGATIVE
Nitrite: NEGATIVE
Protein, ur: 100 mg/dL — AB
Specific Gravity, Urine: 1.017 (ref 1.005–1.030)
pH: 5 (ref 5.0–8.0)

## 2022-11-21 LAB — BRAIN NATRIURETIC PEPTIDE: B Natriuretic Peptide: >4500 pg/mL — ABNORMAL HIGH (ref 0.0–100.0)

## 2022-11-21 LAB — CBC
HCT: 39.3 % (ref 39.0–52.0)
Hemoglobin: 11.7 g/dL — ABNORMAL LOW (ref 13.0–17.0)
MCH: 27 pg (ref 26.0–34.0)
MCHC: 29.8 g/dL — ABNORMAL LOW (ref 30.0–36.0)
MCV: 90.6 fL (ref 80.0–100.0)
Platelets: 272 10*3/uL (ref 150–400)
RBC: 4.34 MIL/uL (ref 4.22–5.81)
RDW: 15.2 % (ref 11.5–15.5)
WBC: 7.2 10*3/uL (ref 4.0–10.5)
nRBC: 0 % (ref 0.0–0.2)

## 2022-11-21 NOTE — ED Provider Notes (Signed)
[facility]-EMERGENCY DEPT [facility] Emergency Department Provider Note MRN:  [id number]  Arrival date & time: 11/22/22     Chief Complaint   Dizziness   History of Present Illness   Michael [name] is a 67 y.o. year-old male with a history of hypertension, CKD, COPD, CHF presenting to the ED with chief complaint of dizziness.  Continued dizziness described as a lightheadedness, feels like he is going to pass out.  Also feeling very short of breath.  Review of Systems  A thorough review of systems was obtained and all systems are negative except as noted in the HPI and PMH.   Patient's Health History    Past Medical History:  Diagnosis Date   Aortic atherosclerosis (HCC) 08/03/2021   CAD (coronary artery disease) 08/03/2021   Cardiac catheterization September 2023 with RCA/RV marginal stenosis managed medically   Cardiomyopathy (HCC)    Carotid artery disease (HCC)    CKD (chronic kidney disease) stage 3, GFR 30-59 ml/min (HCC)    COPD (chronic obstructive pulmonary disease) (HCC)    Essential hypertension    Head and neck cancer (HCC) 2019   Right facial basal cell carcinoma s/p resection with right partial mastectomy and partal rhinectomy with skin graft (06/13/17)   Iron deficiency anemia    Urinary retention     Past Surgical History:  Procedure Laterality Date   ANKLE CLOSED REDUCTION Right    open reduction   BASAL CELL CARCINOMA EXCISION  2019   at [facility]   BIOPSY  07/19/2021   Procedure: BIOPSY;  Surgeon: Lanelle Bal, DO;  Location: [facility] ENDO [facility];  Service: Endoscopy;;   COLONOSCOPY N/A 05/31/2017   Procedure: COLONOSCOPY;  Surgeon: Corbin Ade, MD;  Location: [facility];  Service: Endoscopy;  Laterality: N/A;  2:45pm   COLONOSCOPY WITH PROPOFOL N/A 07/19/2021   Procedure: COLONOSCOPY WITH PROPOFOL;  Surgeon: Lanelle Bal, DO;  Location: [facility];  Service: Endoscopy;  Laterality: N/A;  3:00pm, moved up to 9:00   CYSTOSCOPY N/A 10/29/2018    Procedure: CYSTOSCOPY, CLOT EVACUATION;  Surgeon: Rene Paci, MD;  Location: [facility] ORS;  Service: Urology;  Laterality: N/A;   PARTIAL COLECTOMY Right 08/11/2021   Procedure: PARTIAL COLECTOMY, OPEN RIGHT HEMICOLECTOMY;  Surgeon: Lucretia Roers, MD;  Location: [facility] ORS;  Service: General;  Laterality: Right;   POLYPECTOMY  05/31/2017   Procedure: POLYPECTOMY;  Surgeon: Corbin Ade, MD;  Location[facility];  Service: Endoscopy;;   RIGHT/LEFT HEART CATH AND CORONARY ANGIOGRAPHY N/A 10/22/2021   Procedure: RIGHT/LEFT HEART CATH AND CORONARY ANGIOGRAPHY;  Surgeon: Orbie Pyo, MD;  [facility]: [facility] INV[facility] LAB;  Service: Cardiovascular;  Laterality: N/A;   TONSILLECTOMY     TRANSCAROTID ARTERY REVASCULARIZATION  Right 02/15/2021   Procedure: RIGHT TRANSCAROTID ARTERY REVASCULARIZATION;  Surgeon: Cephus Shelling, MD;  Location: [facility] OR;  Service: Vascular;  Laterality: Right;   ULTRASOUND GUIDANCE FOR VASCULAR ACCESS Left 02/15/2021   Procedure: ULTRASOUND GUIDANCE FOR VASCULAR ACCESS;  Surgeon: Cephus Shelling, MD;  Location: [facility] OR;  Service: Vascular;  Laterality: Left;   XI ROBOTIC ASSISTED SIMPLE PROSTATECTOMY N/A 10/29/2018   Procedure: XI ROBOTIC ASSISTED SIMPLE PROSTATECTOMY;  Surgeon: Malen Gauze, MD;  Location: [facility]S;  Service: Urology;  Laterality: N/A;    Family History  Problem Relation Age of Onset   Stroke Father    Cirrhosis Mother    Colon cancer Neg Hx     Social History   Socioeconomic  History   Marital status: Single    Spouse name: Not on file   Number of children: Not on file   Years of education: 12   Highest education level: 12th grade  Occupational History   Not on file  Tobacco Use   Smoking status: Former    Current packs/day: 0.00    Average packs/day: 0.5 packs/day for 40.0 years (20.0 ttl pk-yrs)    Types: Cigarettes    Start date: 07/02/1981    Quit date: 07/02/2021    Years since quitting: 1.3    Passive exposure:  Past   Smokeless tobacco: Never   Tobacco comments:    Quit on 06/09/21  Vaping Use   Vaping status: Never Used  Substance and Sexual Activity   Alcohol use: Not Currently   Drug use: Never   Sexual activity: Not Currently  Other Topics Concern   Not on file  Social History Narrative   Not on file   Social Determinants of Health   Financial Resource Strain: Low Risk  (08/19/2022)   Overall Financial Resource Strain (CARDIA)    Difficulty of Paying Living Expenses: Not hard at all  Food Insecurity: No Food Insecurity (11/13/2022)   Hunger Vital Sign    Worried About Running Out of Food in the Last Year: Never true    Ran Out of Food in the Last Year: Never true  Transportation Needs: No Transportation Needs (11/13/2022)   PRAPARE - Administrator, Civil Service (Medical): No    Lack of Transportation (Non-Medical): No  Physical Activity: Inactive (08/19/2022)   Exercise Vital Sign    Days of Exercise per Week: 0 days    Minutes of Exercise per Session: 0 min  Stress: No Stress Concern Present (08/19/2022)   [employer] of Occupational Health - Occupational Stress Questionnaire    Feeling of Stress : Not at all  Social Connections: Unknown (08/19/2022)   Social Connection and Isolation Panel [NHANES]    Frequency of Communication with Friends and Family: More than three times a week    Frequency of Social Gatherings with Friends and Family: More than three times a week    Attends Religious Services: More than 4 times per year    Active Member of [employer] or Organizations: Yes    Attends Banker Meetings: More than 4 times per year    Marital Status: Not on file  Intimate Partner Violence: Not At Risk (11/13/2022)   Humiliation, Afraid, Rape, and Kick questionnaire    Fear of Current or Ex-Partner: No    Emotionally Abused: No    Physically Abused: No    Sexually Abused: No     Physical Exam   Vitals:   11/22/22 0345 11/22/22 0545  BP:     Pulse: (!) 111 92  Resp: 16 12  Temp:    SpO2: 93% 96%    CONSTITUTIONAL: Chronically ill-appearing, NAD NEURO/PSYCH:  Alert and oriented x 3, no focal deficits EYES:  eyes equal and reactive ENT/NECK:  no LAD, no JVD CARDIO: Regular rate, well-perfused, normal S1 and S2 PULM: Tachypneic, scattered rhonchi GI/GU:  non-distended, non-tender MSK/SPINE:  No gross deformities, no edema SKIN:  no rash, atraumatic   *Additional and/or pertinent findings included in MDM below  Diagnostic and Interventional Summary    EKG Interpretation Date/Time:  Monday November 21 2022 20:36:05 EDT Ventricular Rate:  90 PR Interval:  187 QRS Duration:  124 QT Interval:  402 QTC Calculation: 487 R  Axis:   -32  Text Interpretation: Sinus rhythm Ventricular premature complex Left ventricular hypertrophy Anterior infarct, old Nonspecific T abnormalities, lateral leads Confirmed by Kennis Carina [phone]) on 11/21/2022 11:58:20 PM       Labs Reviewed  BASIC METABOLIC PANEL - Abnormal; Notable for the following components:      Result Value   Glucose, Bld 101 (*)    BUN 28 (*)    Creatinine, Ser 1.87 (*)    Calcium 8.6 (*)    GFR, Estimated 39 (*)    All other components within normal limits  CBC - Abnormal; Notable for the following components:   Hemoglobin 11.7 (*)    MCHC 29.8 (*)    All other components within normal limits  URINALYSIS, ROUTINE W REFLEX MICROSCOPIC - Abnormal; Notable for the following components:   Protein, ur 100 (*)    All other components within normal limits  BRAIN NATRIURETIC PEPTIDE - Abnormal; Notable for the following components:   B Natriuretic Peptide >4,500.0 (*)    All other components within normal limits  TROPONIN I (HIGH SENSITIVITY) - Abnormal; Notable for the following components:   Troponin I (High Sensitivity) 98 (*)    All other components within normal limits  TROPONIN I (HIGH SENSITIVITY) - Abnormal; Notable for the following components:    Troponin I (High Sensitivity) 103 (*)    All other components within normal limits    DG Chest 2 View  Final Result    CT Angio Chest Pulmonary Embolism (PE) W or WO Contrast    (Results Pending)    Medications  iohexol (OMNIPAQUE) 350 MG/ML injection 75 mL (75 mLs Intravenous Contrast Given 11/22/22 0352)     Procedures  /  Critical Care .Critical Care  Performed by: Sabas Sous, MD Authorized by: Sabas Sous, MD   Critical care provider statement:    Critical care time (minutes):  35   Critical care was necessary to treat or prevent imminent or life-threatening deterioration of the following conditions:  Respiratory failure   Critical care was time spent personally by me on the following activities:  Development of treatment plan with patient or surrogate, discussions with consultants, evaluation of patient's response to treatment, examination of patient, ordering and review of laboratory studies, ordering and review of radiographic studies, ordering and performing treatments and interventions, pulse oximetry, re-evaluation of patient's condition and review of old charts   ED Course and Medical Decision Making  Initial Impression and Ddx Patient is in mild to moderate respiratory distress, has an EF of 25%, suspect CHF exacerbation.  Will send home on palliative care recently.  Vitals are reassuring with the exception of hypoxia.  Starting BiPAP.  Past medical/surgical history that increases complexity of ED encounter: CHF, head neck cancer  Interpretation of Diagnostics I personally reviewed the EKG and my interpretation is as follows: Sinus rhythm, nonspecific T wave findings  Labs reveal markedly elevated BNP, troponin elevation  Patient Reassessment and Ultimate Disposition/Management     Plan is for admission, awaiting PE study given that the chest x-ray did not show evidence of edema.  Patient management required discussion with the following services or  consulting groups:  Hospitalist Service  Complexity of Problems Addressed Acute illness or injury that poses threat of life of bodily function  Additional Data Reviewed and Analyzed Further history obtained from: Prior labs/imaging results  Additional Factors Impacting ED Encounter Risk Consideration of hospitalization  Elmer Sow. Pilar Plate, MD [facility] Emergency Medicine [facility]  [facility] [email or web address]  Final Clinical Impressions(s) / ED Diagnoses     ICD-10-CM   1. Acute respiratory failure with hypoxia (HCC)  J96.01       ED Discharge Orders     None        Discharge Instructions Discussed with and Provided to Patient:   Discharge Instructions   None      Sabas Sous, MD 11/22/22 [phone]

## 2022-11-21 NOTE — ED Triage Notes (Signed)
BIBA c/o dizziness 1hr pta while sitting outside on porch.  Also c/o nausea and sob.  Pt was admitted and discharged on 10/15 for same.

## 2022-11-22 ENCOUNTER — Emergency Department (HOSPITAL_COMMUNITY): Payer: 59

## 2022-11-22 ENCOUNTER — Inpatient Hospital Stay: Payer: 59

## 2022-11-22 DIAGNOSIS — I5023 Acute on chronic systolic (congestive) heart failure: Secondary | ICD-10-CM | POA: Insufficient documentation

## 2022-11-22 DIAGNOSIS — Z9049 Acquired absence of other specified parts of digestive tract: Secondary | ICD-10-CM | POA: Diagnosis not present

## 2022-11-22 DIAGNOSIS — I429 Cardiomyopathy, unspecified: Secondary | ICD-10-CM | POA: Diagnosis present

## 2022-11-22 DIAGNOSIS — J449 Chronic obstructive pulmonary disease, unspecified: Secondary | ICD-10-CM | POA: Diagnosis present

## 2022-11-22 DIAGNOSIS — J441 Chronic obstructive pulmonary disease with (acute) exacerbation: Secondary | ICD-10-CM

## 2022-11-22 DIAGNOSIS — I472 Ventricular tachycardia, unspecified: Secondary | ICD-10-CM | POA: Diagnosis present

## 2022-11-22 DIAGNOSIS — Z85828 Personal history of other malignant neoplasm of skin: Secondary | ICD-10-CM | POA: Diagnosis not present

## 2022-11-22 DIAGNOSIS — I130 Hypertensive heart and chronic kidney disease with heart failure and stage 1 through stage 4 chronic kidney disease, or unspecified chronic kidney disease: Secondary | ICD-10-CM | POA: Diagnosis present

## 2022-11-22 DIAGNOSIS — Z823 Family history of stroke: Secondary | ICD-10-CM | POA: Diagnosis not present

## 2022-11-22 DIAGNOSIS — D509 Iron deficiency anemia, unspecified: Secondary | ICD-10-CM | POA: Diagnosis present

## 2022-11-22 DIAGNOSIS — J9601 Acute respiratory failure with hypoxia: Secondary | ICD-10-CM | POA: Diagnosis not present

## 2022-11-22 DIAGNOSIS — F17210 Nicotine dependence, cigarettes, uncomplicated: Secondary | ICD-10-CM | POA: Diagnosis present

## 2022-11-22 DIAGNOSIS — F17200 Nicotine dependence, unspecified, uncomplicated: Secondary | ICD-10-CM | POA: Diagnosis not present

## 2022-11-22 DIAGNOSIS — I2510 Atherosclerotic heart disease of native coronary artery without angina pectoris: Secondary | ICD-10-CM | POA: Diagnosis not present

## 2022-11-22 DIAGNOSIS — I5032 Chronic diastolic (congestive) heart failure: Secondary | ICD-10-CM | POA: Insufficient documentation

## 2022-11-22 DIAGNOSIS — R55 Syncope and collapse: Secondary | ICD-10-CM

## 2022-11-22 DIAGNOSIS — K7689 Other specified diseases of liver: Secondary | ICD-10-CM | POA: Diagnosis not present

## 2022-11-22 DIAGNOSIS — N1832 Chronic kidney disease, stage 3b: Secondary | ICD-10-CM | POA: Diagnosis present

## 2022-11-22 DIAGNOSIS — Z66 Do not resuscitate: Secondary | ICD-10-CM | POA: Diagnosis present

## 2022-11-22 DIAGNOSIS — E785 Hyperlipidemia, unspecified: Secondary | ICD-10-CM | POA: Diagnosis present

## 2022-11-22 DIAGNOSIS — Z79899 Other long term (current) drug therapy: Secondary | ICD-10-CM | POA: Diagnosis not present

## 2022-11-22 DIAGNOSIS — I5020 Unspecified systolic (congestive) heart failure: Secondary | ICD-10-CM | POA: Diagnosis not present

## 2022-11-22 DIAGNOSIS — Z860101 Personal history of adenomatous and serrated colon polyps: Secondary | ICD-10-CM | POA: Diagnosis not present

## 2022-11-22 DIAGNOSIS — I2489 Other forms of acute ischemic heart disease: Secondary | ICD-10-CM | POA: Diagnosis present

## 2022-11-22 DIAGNOSIS — K219 Gastro-esophageal reflux disease without esophagitis: Secondary | ICD-10-CM | POA: Diagnosis present

## 2022-11-22 DIAGNOSIS — I252 Old myocardial infarction: Secondary | ICD-10-CM | POA: Diagnosis not present

## 2022-11-22 DIAGNOSIS — J9621 Acute and chronic respiratory failure with hypoxia: Secondary | ICD-10-CM | POA: Diagnosis present

## 2022-11-22 DIAGNOSIS — Z716 Tobacco abuse counseling: Secondary | ICD-10-CM | POA: Diagnosis not present

## 2022-11-22 DIAGNOSIS — R42 Dizziness and giddiness: Secondary | ICD-10-CM | POA: Diagnosis not present

## 2022-11-22 DIAGNOSIS — I6521 Occlusion and stenosis of right carotid artery: Secondary | ICD-10-CM | POA: Diagnosis present

## 2022-11-22 DIAGNOSIS — Z87898 Personal history of other specified conditions: Secondary | ICD-10-CM | POA: Diagnosis not present

## 2022-11-22 DIAGNOSIS — E440 Moderate protein-calorie malnutrition: Secondary | ICD-10-CM | POA: Diagnosis not present

## 2022-11-22 DIAGNOSIS — I951 Orthostatic hypotension: Secondary | ICD-10-CM | POA: Diagnosis not present

## 2022-11-22 DIAGNOSIS — J90 Pleural effusion, not elsewhere classified: Secondary | ICD-10-CM | POA: Diagnosis not present

## 2022-11-22 LAB — TROPONIN I (HIGH SENSITIVITY)
Troponin I (High Sensitivity): 98 ng/L — ABNORMAL HIGH (ref <18)
Troponin I (High Sensitivity): 103 ng/L (ref <18)

## 2022-11-22 MED ORDER — CHLORHEXIDINE GLUCONATE CLOTH 2 % EX PADS
6.0000 | MEDICATED_PAD | Freq: Every day | CUTANEOUS | Status: DC
  Administered 2022-11-23 – 2022-11-25 (×3): 6 via TOPICAL

## 2022-11-22 MED ORDER — BUDESONIDE 0.5 MG/2ML IN SUSP
0.5000 mg | Freq: Two times a day (BID) | RESPIRATORY_TRACT | Status: DC
  Administered 2022-11-23 – 2022-11-25 (×6): 0.5 mg via RESPIRATORY_TRACT
  Filled 2022-11-22 (×7): qty 2

## 2022-11-22 MED ORDER — CLOPIDOGREL BISULFATE 75 MG PO TABS
75.0000 mg | ORAL_TABLET | Freq: Every day | ORAL | Status: DC
  Administered 2022-11-23: 75 mg via ORAL
  Filled 2022-11-22: qty 1

## 2022-11-22 MED ORDER — FUROSEMIDE 10 MG/ML IJ SOLN
40.0000 mg | Freq: Once | INTRAMUSCULAR | Status: AC
  Administered 2022-11-22: 40 mg via INTRAVENOUS
  Filled 2022-11-22: qty 4

## 2022-11-22 MED ORDER — IPRATROPIUM-ALBUTEROL 0.5-2.5 (3) MG/3ML IN SOLN
3.0000 mL | Freq: Once | RESPIRATORY_TRACT | Status: AC
  Administered 2022-11-22: 3 mL via RESPIRATORY_TRACT
  Filled 2022-11-22: qty 3

## 2022-11-22 MED ORDER — PROCHLORPERAZINE EDISYLATE 10 MG/2ML IJ SOLN
10.0000 mg | Freq: Once | INTRAMUSCULAR | Status: AC
  Administered 2022-11-22: 10 mg via INTRAVENOUS
  Filled 2022-11-22: qty 2

## 2022-11-22 MED ORDER — ARFORMOTEROL TARTRATE 15 MCG/2ML IN NEBU
15.0000 ug | INHALATION_SOLUTION | Freq: Two times a day (BID) | RESPIRATORY_TRACT | Status: DC
  Administered 2022-11-23 – 2022-11-25 (×6): 15 ug via RESPIRATORY_TRACT
  Filled 2022-11-22 (×7): qty 2

## 2022-11-22 MED ORDER — METHYLPREDNISOLONE SODIUM SUCC 125 MG IJ SOLR
125.0000 mg | Freq: Once | INTRAMUSCULAR | Status: AC
  Administered 2022-11-22: 125 mg via INTRAVENOUS
  Filled 2022-11-22: qty 2

## 2022-11-22 MED ORDER — METHYLPREDNISOLONE SODIUM SUCC 125 MG IJ SOLR
60.0000 mg | Freq: Two times a day (BID) | INTRAMUSCULAR | Status: DC
  Administered 2022-11-23 – 2022-11-25 (×6): 60 mg via INTRAVENOUS
  Filled 2022-11-22 (×6): qty 2

## 2022-11-22 MED ORDER — HEPARIN SODIUM (PORCINE) 5000 UNIT/ML IJ SOLN
5000.0000 [IU] | Freq: Three times a day (TID) | INTRAMUSCULAR | Status: DC
  Administered 2022-11-23 – 2022-11-24 (×6): 5000 [IU] via SUBCUTANEOUS
  Filled 2022-11-22 (×7): qty 1

## 2022-11-22 MED ORDER — PROCHLORPERAZINE EDISYLATE 10 MG/2ML IJ SOLN: 5.0000 mg | Freq: Four times a day (QID) | INTRAMUSCULAR | Status: DC | PRN

## 2022-11-22 MED ORDER — ATORVASTATIN CALCIUM 40 MG PO TABS
40.0000 mg | ORAL_TABLET | Freq: Every evening | ORAL | Status: DC
  Administered 2022-11-23 – 2022-11-24 (×2): 40 mg via ORAL
  Filled 2022-11-22 (×2): qty 1

## 2022-11-22 MED ORDER — IPRATROPIUM-ALBUTEROL 0.5-2.5 (3) MG/3ML IN SOLN
3.0000 mL | Freq: Four times a day (QID) | RESPIRATORY_TRACT | Status: DC
Start: 2022-11-23 — End: 2022-11-23
  Administered 2022-11-23: 3 mL via RESPIRATORY_TRACT
  Filled 2022-11-22: qty 3

## 2022-11-22 MED ORDER — SODIUM CHLORIDE 0.9% FLUSH: 3.0000 mL | INTRAVENOUS | Status: DC | PRN

## 2022-11-22 MED ORDER — CARVEDILOL 3.125 MG PO TABS: 6.2500 mg | ORAL_TABLET | Freq: Two times a day (BID) | ORAL | Status: DC

## 2022-11-22 MED ORDER — ACETAMINOPHEN 325 MG PO TABS: 650.0000 mg | ORAL_TABLET | ORAL | Status: DC | PRN

## 2022-11-22 MED ORDER — IOHEXOL 350 MG/ML SOLN
75.0000 mL | Freq: Once | INTRAVENOUS | Status: AC | PRN
  Administered 2022-11-22: 75 mL via INTRAVENOUS

## 2022-11-22 MED ORDER — SODIUM CHLORIDE 0.9% FLUSH
10.0000 mL | Freq: Two times a day (BID) | INTRAVENOUS | Status: DC
  Administered 2022-11-23 – 2022-11-25 (×4): 10 mL via INTRAVENOUS

## 2022-11-22 MED ORDER — FUROSEMIDE 10 MG/ML IJ SOLN
40.0000 mg | Freq: Two times a day (BID) | INTRAMUSCULAR | Status: DC
  Administered 2022-11-23: 40 mg via INTRAVENOUS
  Filled 2022-11-22: qty 4

## 2022-11-22 MED ORDER — SODIUM CHLORIDE 0.9% FLUSH
3.0000 mL | Freq: Two times a day (BID) | INTRAVENOUS | Status: DC
  Administered 2022-11-23 – 2022-11-25 (×5): 3 mL via INTRAVENOUS

## 2022-11-22 MED ORDER — CARVEDILOL 3.125 MG PO TABS
6.2500 mg | ORAL_TABLET | Freq: Two times a day (BID) | ORAL | Status: DC
  Administered 2022-11-22 – 2022-11-24 (×5): 6.25 mg via ORAL
  Filled 2022-11-22 (×5): qty 2

## 2022-11-22 NOTE — H&P (Signed)
History and Physical    Patient: Michael [name] ZOX:[id number] DOB: Sep 05, 1955 DOA: 11/21/2022 DOS: the patient was seen and examined on 11/22/2022 PCP: Benita Stabile, MD  Patient coming from: Home  Chief Complaint:  Chief Complaint  Patient presents with   Dizziness   HPI: Michael [name] is a 67 year old male with a history of HFrEF, orthostatic hypotension with recurrent syncope, CKD stage III, coronary disease, COPD, tobacco abuse, basal cell carcinoma status post partial rhinectomy and maxillectomy 06/06/2017 at [facility], advanced colonic tubular adenoma, right carotid stenosis, hypertension, hyperlipidemia presenting with syncope and shortness of breath.  Patient denies fevers, chills, headache, chest pain, dyspnea, nausea, vomiting, diarrhea, abdominal pain, dysuria, hematuria, hematochezia, and melena.  He states that he was sitting in his chair enjoying the sunshine when he lost consciousness for about 30 minutes.  He denies any bowel or bladder incontinence.  He did not bite his tongue.  He said that he felt some dizziness and lightheadedness after which she had the syncopal episode.  He continues to smoke 3 cigarettes/day.  The patient has had numerous hospitalizations for syncope, acute on chronic HFrEF and COPD ex.>>>  The patient was most recently discharged from the hospital after a stay from 11/13/2022 to 11/15/2022 for syncope.  The patient was discharged home with a Zio patch, but he states that it "fell off", and he does not know when it fell off.  Telemetry during his hospital admission did not show any dysrhythmias.  Hospitalized 11/04/2022 to 11/05/2022 -Secondary to near syncope, etiology unclear--discharged home on Coreg, losartan, torsemide -Troponins were flat, no concerning dysrhythmias on telemetry.  Hospitalized 08/15/2022 to 08/18/2022 -- Secondary to orthostatic hypotension and acute on chronic HFrEF  04/29/2022 to 05/03/2022 for syncope and COPD exacerbation.  He was discharged with prednisone taper, albuterol. It was felt his syncope was related to orthostasis. His Entresto and amlodipine were discontinued during that hospitalization. Prior to that, the patient was also hospitalized from 04/25/2022 to 04/27/2022 due to elevated troponin. This was thought to be related to demand ischemia. He also had some asymptomatic NSVT.     His medical treatment has been difficult secondary to his social situation and prior homelessness, poor compliance, and poor insight.  In the ED, the patient was afebrile and hemodynamically stable. He was initially placed on BiPAP and weaned to nonrebreather.  WBC 7.2, hemoglobin 11.7, platelets 272.  Sodium 140, potassium 2.7, bicarbonate 25, serum creatinine 1.87.  The patient was given DuoNebs, Solu-Medrol and Lasix 40 mg IV x 1.   Review of Systems: As mentioned in the history of present illness. All other systems reviewed and are negative. Past Medical History:  Diagnosis Date   Aortic atherosclerosis (HCC) 08/03/2021   CAD (coronary artery disease) 08/03/2021   Cardiac catheterization September 2023 with RCA/RV marginal stenosis managed medically   Cardiomyopathy (HCC)    Carotid artery disease (HCC)    CKD (chronic kidney disease) stage 3, GFR 30-59 ml/min (HCC)    COPD (chronic obstructive pulmonary disease) (HCC)    Essential hypertension    Head and neck cancer (HCC) 2019   Right facial basal cell carcinoma s/p resection with right partial mastectomy and partal rhinectomy with skin graft (06/13/17)   Iron deficiency anemia    Urinary retention    Past Surgical History:  Procedure Laterality Date   ANKLE CLOSED REDUCTION Right    open reduction   BASAL CELL CARCINOMA EXCISION  2019   [facility]   BIOPSY  07/19/2021  Procedure: BIOPSY;  Surgeon: Lanelle Bal, DO;  [facility];  Service: Endoscopy;;   COLONOSCOPY N/A 05/31/2017   Procedure: COLONOSCOPY;  Surgeon: Corbin Ade, MD;  Location: [facility]  END[facility];  Service: Endoscopy;  Laterality: N/A;  2:45pm   COLONOSCOPY WITH PROPOFOL N/A 07/19/2021   Procedure: COLONOSCOPY WITH PROPOFOL;  Surgeon: Lanelle Bal, DO;  Location[facility];  Service: Endoscopy;  Laterality: N/A;  3:00pm, moved up to 9:00   CYSTOSCOPY N/A 10/29/2018   Procedure: CYSTOSCOPY, CLOT EVACUATION;  Surgeon: Rene Paci, MD;  Location: [facility];  Service: Urology;  Laterality: N/A;   PARTIAL COLECTOMY Right 08/11/2021   Procedure: PARTIAL COLECTOMY, OPEN RIGHT HEMICOLECTOMY;  Surgeon: Lucretia Roers, MD;  Location: [facility];  Service: General;  Laterality: Right;   POLYPECTOMY  05/31/2017   Procedure: POLYPECTOMY;  Surgeon: Corbin Ade, MD;  Location[facility];  Service: Endoscopy;;   RIGHT/LEFT HEART CATH AND CORONARY ANGIOGRAPHY N/A 10/22/2021   Procedure: RIGHT/LEFT HEART CATH AND CORONARY ANGIOGRAPHY;  Surgeon: Orbie Pyo, MD;  [facility];  Service: Cardiovascular;  Laterality: N/A;   TONSILLECTOMY     TRANSCAROTID ARTERY REVASCULARIZATION  Right 02/15/2021   Procedure: RIGHT TRANSCAROTID ARTERY REVASCULARIZATION;  Surgeon: Cephus Shelling, MD;  Location: [facility] OR;  Service: Vascular;  Laterality: Right;   ULTRASOUND GUIDANCE FOR VASCULAR ACCESS Left 02/15/2021   Procedure: ULTRASOUND GUIDANCE FOR VASCULAR ACCESS;  Surgeon: Cephus Shelling, MD;  Location: [facility] OR;  Service: Vascular;  Laterality: Left;   XI ROBOTIC ASSISTED SIMPLE PROSTATECTOMY N/A 10/29/2018   Procedure: XI ROBOTIC ASSISTED SIMPLE PROSTATECTOMY;  Surgeon: Malen Gauze, MD;  Location: [facility];  Service: Urology;  Laterality: N/A;   Social History:  reports that he quit smoking about 16 months ago. His smoking use included cigarettes. He started smoking about 41 years ago. He has a 20 pack-year smoking history. He has been exposed to tobacco smoke. He has never used smokeless tobacco. He reports that he does not currently use alcohol. He  reports that he does not use drugs.  No Known Allergies  Family History  Problem Relation Age of Onset   Stroke Father    Cirrhosis Mother    Colon cancer Neg Hx     Prior to Admission medications   Medication Sig Start Date End Date Taking? Authorizing Provider  albuterol (VENTOLIN HFA) 108 (90 Base) MCG/ACT inhaler Inhale 2 puffs into the lungs every 6 (six) hours as needed for wheezing or shortness of breath. 04/27/22   Shahmehdi, Gemma Payor, MD  atorvastatin (LIPITOR) 40 MG tablet Take 1 tablet (40 mg total) by mouth every evening. 08/01/22 11/14/22  Shahmehdi, Gemma Payor, MD  Budeson-Glycopyrrol-Formoterol (BREZTRI AEROSPHERE) 160-9-4.8 MCG/ACT AERO Inhale 2 puffs into the lungs 2 (two) times daily.    [provider]  carvedilol (COREG) 6.25 MG tablet Take 1 tablet (6.25 mg total) by mouth 2 (two) times daily with a meal. 11/15/22 12/15/22  Shahmehdi, Gemma Payor, MD  ciprofloxacin (CIPRO) 500 MG tablet Take 1 tablet (500 mg total) by mouth 2 (two) times daily for 7 days. 11/15/22 11/22/22  Kendell Bane, MD  clopidogrel (PLAVIX) 75 MG tablet Take 1 tablet (75 mg total) by mouth daily. 11/16/22   Shahmehdi, Gemma Payor, MD  lactobacillus acidophilus & bulgar (LACTINEX) chewable tablet Chew 1 tablet by mouth 3 (three) times daily with meals for 10 days. 11/15/22 11/25/22  Kendell Bane, MD  losartan (COZAAR) 25 MG tablet Take 1 tablet (25 mg total) by mouth daily. 11/15/22 12/15/22  Shahmehdi, Gemma Payor, MD  potassium chloride SA (KLOR-CON M) 20 MEQ tablet Take 1 tablet (20 mEq total) by mouth daily. 07/26/22 11/14/22  Shahmehdi, Gemma Payor, MD  REPATHA SURECLICK 140 MG/ML SOAJ Inject 1 mL into the skin every 14 (fourteen) days. 10/19/22   [provider]  torsemide (DEMADEX) 20 MG tablet Take 1 tablet (20 mg total) by mouth daily. 11/16/22   Kendell Bane, MD    Physical Exam: Vitals:   11/22/22 0345 11/22/22 0545 11/22/22 0630 11/22/22 0730  BP:    (!) 199/118  Pulse:  (!) 111 92 97 94  Resp: 16 12 10  (!) 25  Temp:      TempSrc:      SpO2: 93% 96% 91% 98%   GENERAL:  A&O x 3, NAD, well developed, cooperative, follows commands HEENT: [place]/AT, No thrush, No icterus, No oral ulcers Neck:  No neck mass, No meningismus, soft, supple CV: RRR, no S3, no S4, no rub,+ JVD Lungs: Bibasilar crackles.  Fine bibasilar wheeze. Abd: soft/NT +BS, nondistended Ext: trace LE edema, no lymphangitis, no cyanosis, no rashes Neuro:  CN II-XII intact, strength 4/5 in RUE, RLE, strength 4/5 LUE, LLE; sensation intact bilateral; no dysmetria; babinski equivocal  Data Reviewed: Data reviewed above in history  Assessment and Plan: Acute respiratory failure with hypoxia -Secondary COPD exacerbation and CHF -Currently on nonrebreather -Wean oxygen back to room air for saturation greater 92% -11/21/2022 CTA chest negative for PE, small right pleural effusion, mild interlobular septal thickening suggestive of interstitial edema  Acute on chronic HFrEF -He is clinically fluid overloaded with pleural effusions and JVD on exam -11/05/2022 echo EF 25%, mild decreased RV function, global HK, mild MR/TR -08/16/2022 echo--EF 25%, global HK, -04/30/2022 echo EF 35-40%, global HK, normal PASP, mild TR -Entresto was discontinued during his Early April 2024 hospitalization due to dizziness and orthostasis -Initiated on torsemide 20 mg daily last hospital admission -Start furosemide 40 mg IV twice daily -Accurate I's and O's -Daily weights  Syncope -Has been a recurrent problem with a history of orthostatic hypotension -Patient states that his Zio patch "fell off" at home since his last hospitalization -Reconsult cardiology -Remain on telemetry  COPD exacerbation -Patient continues to smoke -Start DuoNebs -Start Brovana -Start Pulmicort  CKD stage IIIb -Baseline creatinine 1.8-2.0   Essential hypertension -continue coreg   Tobacco abuse -Tobacco cessation discussed -pt now  states he quit one month ago   Right Carotid stenosis -The patient has follow-up with Dr. Leonette Most fields -05/12/2017 carotid ultrasound shows 80-99% right carotid stenosis -70% heavily calcified RICA stenosis and no significant left-sided disease by CT angiography.  He is now following with Dr. Olga Coaster was not felt to be a candidate for carotid stenting and suboptimal candidate for endarterectomy. Plan is for medical therapy at this point -continue plavix   Basal cell carcinoma of nasolabial fold -06/06/2017--right partial rhinectomy, resection of left intranasal lesion, skin graft @[facility] -Margins were clear on pathology -Most recent follow-up was 06/23/2017   Iron deficiency anemia/Advanced colonic adenoma -The patient had colonoscopy 05/31/2017--Dr. Rourk -He was noted to have numerous polyps as well as a polypoid mass in the hepatic flexure to the cecum area -pathology of mass--tubular adenoma -He was referred to general surgery, Dr. Larae Grooms for hemicolectomy -He has been lost to follow-up -noncompliant with po iron   Advance Care Planning: FULL  Consults: cardiology  Family Communication: none  Severity of Illness: The appropriate patient status for this patient is INPATIENT. Inpatient status is judged to be reasonable and necessary in order to provide the required intensity of service to ensure the patient's safety. The patient's presenting symptoms, physical exam findings, and initial radiographic and laboratory data in the context of their chronic comorbidities is felt to place them at high risk for further clinical deterioration. Furthermore, it is not anticipated that the patient will be medically stable for discharge from the hospital within 2 midnights of admission.   * I certify that at the point of admission it is my clinical judgment that the patient will require inpatient hospital care spanning beyond 2 midnights from the point of admission due to high intensity of  service, high risk for further deterioration and high frequency of surveillance required.*  Author: Catarina Hartshorn, MD 11/22/2022 8:07 AM  For on call review [email or web address].

## 2022-11-22 NOTE — Progress Notes (Signed)
Applied gauze to patient's nose before placing BIPAP mask to help with skin irritation and comfort. Patient recently had skin cancer removed and still has an open wound from this.

## 2022-11-22 NOTE — Progress Notes (Signed)
RN called to inform this RT that patient was not tolerating BIPAP any longer because it was hurting his face. Told RN to let MD know.

## 2022-11-22 NOTE — Hospital Course (Addendum)
67 year old male with a history of HFrEF, orthostatic hypotension with recurrent syncope, CKD stage III, coronary disease, COPD, tobacco abuse, basal cell carcinoma status post partial rhinectomy and maxillectomy 06/06/2017 at [facility], advanced colonic tubular adenoma, right carotid stenosis, hypertension, hyperlipidemia presenting with syncope and shortness of breath.  Patient denies fevers, chills, headache, chest pain, dyspnea, nausea, vomiting, diarrhea, abdominal pain, dysuria, hematuria, hematochezia, and melena.  He states that he was sitting in his chair enjoying the sunshine when he lost consciousness for about 30 minutes.  He denies any bowel or bladder incontinence.  He did not bite his tongue.  He said that he felt some dizziness and lightheadedness after which she had the syncopal episode.  He continues to smoke 3 cigarettes/day.  The patient has had numerous hospitalizations for syncope, acute on chronic HFrEF and COPD ex.>>>  The patient was most recently discharged from the hospital after a stay from 11/13/2022 to 11/15/2022 for syncope.  The patient was discharged home with a Zio patch, but he states that it "fell off", and he does not know when it fell off.  Telemetry during his hospital admission did not show any dysrhythmias.  Hospitalized 11/04/2022 to 11/05/2022 -Secondary to near syncope, etiology unclear--discharged home on Coreg, losartan, torsemide -Troponins were flat, no concerning dysrhythmias on telemetry.  Hospitalized 08/15/2022 to 08/18/2022 -- Secondary to orthostatic hypotension and acute on chronic HFrEF  04/29/2022 to 05/03/2022 for syncope and COPD exacerbation. He was discharged with prednisone taper, albuterol. It was felt his syncope was related to orthostasis. His Entresto and amlodipine were discontinued during that hospitalization. Prior to that, the patient was also hospitalized from 04/25/2022 to 04/27/2022 due to elevated troponin. This was thought to be related to  demand ischemia. He also had some asymptomatic NSVT.     His medical treatment has been difficult secondary to his social situation and prior homelessness, poor compliance, and poor insight.  In the ED, the patient was afebrile and hemodynamically stable. He was initially placed on BiPAP and weaned to nonrebreather.  WBC 7.2, hemoglobin 11.7, platelets 272.  Sodium 140, potassium 2.7, bicarbonate 25, serum creatinine 1.87.  The patient was given DuoNebs, Solu-Medrol and Lasix 40 mg IV x 1.

## 2022-11-22 NOTE — Plan of Care (Signed)

## 2022-11-22 NOTE — ED Notes (Signed)
Pt took BiPAP mask off due to comfort. Pt placed on NRB instead.

## 2022-11-22 NOTE — ED Notes (Signed)
Requested nausea medication for pt complaints of nausea.

## 2022-11-22 NOTE — ED Notes (Signed)
ED TO INPATIENT HANDOFF REPORT  ED Nurse Name and Phone #: [phone]  S Name/Age/Gender Michael [name] 67 y.o. male Room/Bed: APA14/APA14  Code Status   Code Status: Full Code  Home/SNF/Other Home Patient oriented to: self, place, time, and situation Is this baseline? Yes   Triage Complete: Triage complete  Chief Complaint Acute on chronic respiratory failure with hypoxia (HCC) [J96.21]  Triage Note BIBA c/o dizziness 1hr pta while sitting outside on porch.  Also c/o nausea and sob.  Pt was admitted and discharged on 10/15 for same.    Allergies No Known Allergies  Level of Care/Admitting Diagnosis ED Disposition     ED Disposition  Admit   Condition  --   Comment  Hospital Area: [facility] [[id number]]  Level of Care: Stepdown [14]  Covid Evaluation: Symptomatic Person Under Investigation (PUI) or recent exposure (last 10 days) *Testing Required*  Diagnosis: Acute on chronic respiratory failure with hypoxia [facility]) [[id number]]  Admitting Physician: TAT, DAVID [email or web address]-ming.Orion  Attending Physician: TAT, DAVID Shoi-ming.Orion  Certification:: I certify this patient will need inpatient services for at least 2 midnights  Expected Medical Readiness: 11/25/2022          B Medical/Surgery History Past Medical History:  Diagnosis Date   Aortic atherosclerosis (HCC) 08/03/2021   CAD (coronary artery disease) 08/03/2021   Cardiac catheterization September 2023 with RCA/RV marginal stenosis managed medically   Cardiomyopathy (HCC)    Carotid artery disease (HCC)    CKD (chronic kidney disease) stage 3, GFR 30-59 ml/min (HCC)    COPD (chronic obstructive pulmonary disease) (HCC)    Essential hypertension    Head and neck cancer (HCC) 2019   Right facial basal cell carcinoma s/p resection with right partial mastectomy and partal rhinectomy with skin graft (06/13/17)   Iron deficiency anemia    Urinary retention    Past Surgical History:  Procedure Laterality Date   ANKLE  CLOSED REDUCTION Right    open reduction   BASAL CELL CARCINOMA EXCISION  2019   at [facility]   BIOPSY  07/19/2021   Procedure: BIOPSY;  Surgeon: Lanelle Bal, DO;  [facility];  Service: Endoscopy;;   COLONOSCOPY N/A 05/31/2017   Procedure: COLONOSCOPY;  Surgeon: Corbin Ade, MD;  [facility];  Service: Endoscopy;  Laterality: N/A;  2:45pm   COLONOSCOPY WITH PROPOFOL N/A 07/19/2021   Procedure: COLONOSCOPY WITH PROPOFOL;  Surgeon: Lanelle Bal, DO;  [facility];  Service: Endoscopy;  Laterality: N/A;  3:00pm, moved up to 9:00   CYSTOSCOPY N/A 10/29/2018   Procedure: CYSTOSCOPY, CLOT EVACUATION;  Surgeon: Rene Paci, MD;  Location: [facility] OR[facility];  Service: Urology;  Laterality: N/A;   PARTIAL COLECTOMY Right 08/11/2021   Procedure: PARTIAL COLECTOMY, OPEN RIGHT HEMICOLECTOMY;  Surgeon: Lucretia Roers, MD;  Location: [facility];  Service: General;  Laterality: Right;   POLYPECTOMY  05/31/2017   Procedure: POLYPECTOMY;  Surgeon: Corbin Ade, MD;  [facility];  Service: Endoscopy;;   RIGHT/LEFT HEART CATH AND CORONARY ANGIOGRAPHY N/A 10/22/2021   Procedure: RIGHT/LEFT HEART CATH AND CORONARY ANGIOGRAPHY;  Surgeon: Orbie Pyo, MD;  [facility]: [facility];  Service: Cardiovascular;  Laterality: N/A;   TONSILLECTOMY     TRANSCAROTID ARTERY REVASCULARIZATION  Right 02/15/2021   Procedure: RIGHT TRANSCAROTID ARTERY REVASCULARIZATION;  Surgeon: Cephus Shelling, MD;  Location: [facility] OR;  Service: Vascular;  Laterality: Right;   ULTRASOUND GUIDANCE FOR VASCULAR ACCESS Left 02/15/2021  Procedure: ULTRASOUND GUIDANCE FOR VASCULAR ACCESS;  Surgeon: Cephus Shelling, MD;  Location: [facility] OR;  Service: Vascular;  Laterality: Left;   XI ROBOTIC ASSISTED SIMPLE PROSTATECTOMY N/A 10/29/2018   Procedure: XI ROBOTIC ASSISTED SIMPLE PROSTATECTOMY;  Surgeon: Malen Gauze, MD;  Location: [facility];  Service: Urology;  Laterality: N/A;      A IV Location/Drains/Wounds Patient Lines/Drains/Airways Status     Active Line/Drains/Airways     Name Placement date Placement time Site Days   Peripheral IV 11/22/22 20 G Anterior;Proximal;Right Forearm 11/22/22  0015  Forearm  less than 1            Intake/Output Last 24 hours No intake or output data in the 24 hours ending 11/22/22 1447  Labs/Imaging Results for orders placed or performed during the hospital encounter of 11/21/22 (from the past 48 hour(s))  Basic metabolic panel     Status: Abnormal   Collection Time: 11/21/22  3:01 PM  Result Value Ref Range   Sodium 140 135 - 145 mmol/L   Potassium 3.7 3.5 - 5.1 mmol/L   Chloride 109 98 - 111 mmol/L   CO2 25 22 - 32 mmol/L   Glucose, Bld 101 (H) 70 - 99 mg/dL    Comment: Glucose reference range applies only to samples taken after fasting for at least 8 hours.   BUN 28 (H) 8 - 23 mg/dL   Creatinine, Ser 8.11 (H) 0.61 - 1.24 mg/dL   Calcium 8.6 (L) 8.9 - 10.3 mg/dL   GFR, Estimated 39 (L) >60 mL/min    Comment: (NOTE) Calculated using the CKD-EPI Creatinine Equation (2021)    Anion gap 6 5 - 15    Comment: Performed at [facility], [address], [place], [place] [address]  CBC     Status: Abnormal   Collection Time: 11/21/22  3:01 PM  Result Value Ref Range   WBC 7.2 4.0 - 10.5 K/uL   RBC 4.34 4.22 - 5.81 MIL/uL   Hemoglobin 11.7 (L) 13.0 - 17.0 g/dL   HCT 29.5 62.1 - 30.8 %   MCV 90.6 80.0 - 100.0 fL   MCH 27.0 26.0 - 34.0 pg   MCHC 29.8 (L) 30.0 - 36.0 g/dL   RDW 65.7 84.6 - 96.2 %   Platelets 272 150 - 400 K/uL   nRBC 0.0 0.0 - 0.2 %    Comment: Performed at [facility], [address] [place], [place] [address]  Brain natriuretic peptide     Status: Abnormal   Collection Time: 11/21/22  3:01 PM  Result Value Ref Range   B Natriuretic Peptide >4,500.0 (H) 0.0 - 100.0 pg/mL    Comment: Performed at [facility], [address], [place], [place] [address]  Urinalysis, Routine w reflex microscopic  -Urine, Clean Catch     Status: Abnormal   Collection Time: 11/21/22  9:00 PM  Result Value Ref Range   Color, Urine YELLOW YELLOW   APPearance CLEAR CLEAR   Specific Gravity, Urine 1.017 1.005 - 1.030   pH 5.0 5.0 - 8.0   Glucose, UA NEGATIVE NEGATIVE mg/dL   Hgb urine dipstick NEGATIVE NEGATIVE   Bilirubin Urine NEGATIVE NEGATIVE   Ketones, ur NEGATIVE NEGATIVE mg/dL   Protein, ur 010 (A) NEGATIVE mg/dL   Nitrite NEGATIVE NEGATIVE   Leukocytes,Ua NEGATIVE NEGATIVE   RBC / HPF 0-5 0 - 5 RBC/hpf   WBC, UA 6-10 0 - 5 WBC/hpf   Bacteria, UA NONE SEEN NONE SEEN  Squamous Epithelial / HPF 0-5 0 - 5 /HPF   Mucus PRESENT     Comment: Performed at [facility], [address], [place], [place] [address]  Troponin I (High Sensitivity)     Status: Abnormal   Collection Time: 11/21/22 11:49 PM  Result Value Ref Range   Troponin I (High Sensitivity) 98 (H) <18 ng/L    Comment: (NOTE) Elevated high sensitivity troponin I (hsTnI) values and significant  changes across serial measurements may suggest ACS but many other  chronic and acute conditions are known to elevate hsTnI results.  Refer to the "Links" section for chest pain algorithms and additional  guidance. Performed at [facility], [address], [place], [place] [address]   Troponin I (High Sensitivity)     Status: Abnormal   Collection Time: 11/22/22  1:29 AM  Result Value Ref Range   Troponin I (High Sensitivity) 103 (HH) <18 ng/L    Comment: CRITICAL RESULT CALLED TO, READ BACK BY AND VERIFIED WITH ABBY WOODY [id number], VIRAY,J (NOTE) Elevated high sensitivity troponin I (hsTnI) values and significant  changes across serial measurements may suggest ACS but many other  chronic and acute conditions are known to elevate hsTnI results.  Refer to the "Links" section for chest pain algorithms and additional  guidance. Performed at [facility], [address], [place], [place] [address]    CT Angio Chest Pulmonary Embolism  (PE) W or WO Contrast  Result Date: 11/22/2022 CLINICAL DATA:  Rule out pulmonary embolism. Dizziness 1 hour prior to arrival while sitting on callus. Patient also complains of shortness of breath Dr. Stefanie Libel thank you EXAM: CT ANGIOGRAPHY CHEST WITH CONTRAST TECHNIQUE: Multidetector CT imaging of the chest was performed using the standard protocol during bolus administration of intravenous contrast. Multiplanar CT image reconstructions and MIPs were obtained to evaluate the vascular anatomy. RADIATION DOSE REDUCTION: This exam was performed according to the departmental dose-optimization program which includes automated exposure control, adjustment of the mA and/or kV according to patient size and/or use of iterative reconstruction technique. CONTRAST:  75mL OMNIPAQUE IOHEXOL 350 MG/ML SOLN COMPARISON:  07/28/2022. FINDINGS: Cardiovascular: Satisfactory opacification of the pulmonary arteries to the segmental level. No evidence of pulmonary embolism. Aortic atherosclerosis. Three vessel coronary artery calcifications. Mild cardiac enlargement. Trace pericardial effusion. Mediastinum/Nodes: No enlarged mediastinal, hilar, or axillary lymph nodes. Thyroid gland, trachea, and esophagus demonstrate no significant findings. Lungs/Pleura: Small right pleural effusion. No airspace consolidation, atelectasis, or pneumothorax. No consolidative change. Mild interlobular septal thickening within the lower lung zones. 4 mm nodule identified within the right upper lobe, image 25/6. Unchanged from 04/02/2017. This is compatible with a benign nodule requiring no further follow-up. Upper Abdomen: No acute abnormality. Cyst identified within right lobe of liver is unchanged measuring 1.2 cm. Musculoskeletal: No chest wall abnormality. No acute or significant osseous findings. Review of the MIP images confirms the above findings. IMPRESSION: 1. No evidence for acute pulmonary embolus. 2. Small right pleural effusion. 3. Mild  interlobular septal thickening within the lower lung zones suggestive of mild interstitial edema. 4. Coronary artery calcifications. 5.  Aortic Atherosclerosis (ICD10-I70.0). Electronically Signed   By: Signa Kell M.D.   On: 11/22/2022 06:56   DG Chest 2 View  Result Date: 11/22/2022 CLINICAL DATA:  Weakness.  Elevated blood pressure. EXAM: CHEST - 2 VIEW COMPARISON:  11/04/2022 FINDINGS: Diffuse cardiac enlargement. No change since prior study. No vascular congestion, edema, or consolidation. No pleural effusions. No pneumothorax. Mediastinal contours appear intact. Calcification of  the aorta. Degenerative changes in the spine and shoulders. IMPRESSION: Cardiac enlargement without change.  Lungs are clear. Electronically Signed   By: Burman Nieves M.D.   On: 11/22/2022 00:28    Pending Labs [employer] (From admission, onward)     Start     Ordered   Signed and Interior and spatial designer  Daily,   R     Comments: As Scheduled for 5 days    Signed and Held   Signed and Held  SARS Coronavirus 2 by RT PCR (hospital order, performed in [facility] hospital lab) *cepheid single result test* Anterior Nasal Swab  (Tier 2 - SARS Coronavirus 2 by RT PCR (hospital order, performed in [facility] hospital lab) *cepheid single result test*)  Once,   R        Signed and Held   Signed and Held  Blood gas, venous  Once,   R        Signed and Held            Vitals/Pain Today's Vitals   11/22/22 1230 11/22/22 1300 11/22/22 1312 11/22/22 1415  BP: (!) 149/102 (!) 141/92  (!) 162/89  Pulse: 70 80  69  Resp: (!) 33 16    Temp:   97.7 F (36.5 C)   TempSrc:   Axillary   SpO2: 96% 95%  96%  PainSc:        Isolation Precautions No active isolations  Medications Medications  carvedilol (COREG) tablet 6.25 mg (6.25 mg Oral Given 11/22/22 1103)  iohexol (OMNIPAQUE) 350 MG/ML injection 75 mL (75 mLs Intravenous Contrast Given 11/22/22 0352)  methylPREDNISolone sodium succinate  (SOLU-MEDROL) 125 mg/2 mL injection 125 mg (125 mg Intravenous Given 11/22/22 0725)  ipratropium-albuterol (DUONEB) 0.5-2.5 (3) MG/3ML nebulizer solution 3 mL (3 mLs Nebulization Given 11/22/22 0810)  furosemide (LASIX) injection 40 mg (40 mg Intravenous Given 11/22/22 0803)  prochlorperazine (COMPAZINE) injection 10 mg (10 mg Intravenous Given 11/22/22 0940)    Mobility walks     Focused Assessments    R Recommendations: See Admitting Provider Note  Report given to:   Additional Notes:

## 2022-11-22 NOTE — TOC Initial Note (Signed)
Transition of Care [facility]) - Initial/Assessment Note    Patient Details  Name: Michael [name] MRN: [id number] Date of Birth: 1955-04-07  Transition of [facility]) CM/SW Contact:    Villa Herb, LCSWA Phone Number: 11/22/2022, 1:59 PM  Clinical Narrative:                 Pt is high risk for readmission and well known to TOC. Pt most recently admitted and seen by TOC on 10/14. Pt continues to reside with his friends and plans to return home at dc. Pt states he is able to get to appointments and obtain medication as needed. TOC to follow.   Expected Discharge Plan: Home/Self Care Barriers to Discharge: Continued Medical Work up   Patient Goals and CMS Choice Patient states their goals for this hospitalization and ongoing recovery are:: return home CMS Medicare.gov Compare Post Acute Care list provided to:: Patient Choice offered to / list presented to : Patient      Expected Discharge Plan and Services In-house Referral: Clinical Social Work Discharge Planning Services: CM Consult   Living arrangements for the past 2 months: Single Family Home                                      Prior Living Arrangements/Services Living arrangements for the past 2 months: Single Family Home Lives with:: Friends Patient language and need for interpreter reviewed:: Yes Do you feel safe going back to the place where you live?: Yes      Need for Family Participation in Patient Care: No (Comment) Care giver support system in place?: Yes (comment)   Criminal Activity/Legal Involvement Pertinent to Current Situation/Hospitalization: No - Comment as needed  Activities of Daily Living   ADL Screening (condition at time of admission) Independently performs ADLs?: Yes (appropriate for developmental age) Is the patient deaf or have difficulty hearing?: No Does the patient have difficulty seeing, even when wearing glasses/contacts?: No Does the patient have difficulty concentrating,  remembering, or making decisions?: No  Permission Sought/Granted                  Emotional Assessment Appearance:: Appears stated age Attitude/Demeanor/Rapport: Engaged Affect (typically observed): Accepting Orientation: : Oriented to Self, Oriented to Place, Oriented to  Time, Oriented to Situation Alcohol / Substance Use: Not Applicable Psych Involvement: No (comment)  Admission diagnosis:  Acute on chronic respiratory failure with hypoxia (HCC) [J96.21] Patient Active Problem List   Diagnosis Date Noted   Acute on chronic respiratory failure with hypoxia (HCC) 11/22/2022   Acute on chronic HFrEF (heart failure with reduced ejection fraction) (HCC) 11/22/2022   Near syncope 11/04/2022   Calculus of gallbladder without cholecystitis without obstruction 08/10/2022   Elevated troponin level not due myocardial infarction 07/29/2022   Acute on chronic combined systolic and diastolic CHF (congestive heart failure) (HCC) 07/24/2022   History of malignant neoplasm of skin 05/17/2022   Nicotine dependence, cigarettes, uncomplicated 05/17/2022   Noncompliance with treatment 05/17/2022   Orthostatic hypotension 05/17/2022   Vitamin D deficiency 05/17/2022   Troponin level elevated 04/29/2022   Elevated brain natriuretic peptide (BNP) level 04/29/2022   Solitary pulmonary nodule 04/26/2022   First degree heart block 04/12/2022   Fracture of rib 02/27/2022   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   COPD (chronic obstructive pulmonary disease) (HCC) 11/03/2021   Acute non-ST segment elevation myocardial infarction (HCC) 11/03/2021  Aneurysm of infrarenal abdominal aorta (HCC) 11/03/2021   Functional gait abnormality 11/03/2021   Hypoalbuminemia 11/03/2021   Hypomagnesemia 11/03/2021   Mixed hyperlipidemia 11/03/2021   Moderate protein-calorie malnutrition (HCC) 11/03/2021   Physical deconditioning 11/03/2021   Proteinuria 11/03/2021   Transaminitis 10/23/2021   Pressure  injury of skin 10/22/2021   Type 2 MI (myocardial infarction) (HCC) 10/20/2021   Myocardial infarction (HCC) 10/20/2021   Atrophy of pancreas 08/11/2021   Mass of colon 08/06/2021   Preoperative cardiovascular examination 08/03/2021   CAD (coronary artery disease) 08/03/2021   Chronic kidney disease (CKD), stage III (moderate) (HCC) 08/03/2021   Hardening of the aorta (main artery of the heart) (HCC) 08/03/2021   Rectal hemorrhage 06/16/2021   History of adenomatous polyp of colon 06/16/2021   Carotid stenosis, asymptomatic, right 02/15/2021   Occlusion and stenosis of right carotid artery 02/15/2021   Basal cell carcinoma of [place], right 12/06/2019   Incisional hernia 11/15/2019   Elevated PSA 06/19/2019   Benign prostatic hyperplasia with urinary obstruction 10/29/2018   CAP (community acquired pneumonia) 05/09/2018   Hypertensive emergency 05/09/2018   Chronic combined systolic and diastolic CHF (congestive heart failure) (HCC) 05/09/2018   Lobar pneumonia (HCC) 05/09/2018   Retention of urine 11/27/2017   Vasovagal syncope 09/17/2017   Sepsis due to urinary tract infection (HCC) 09/01/2017   Syncope and collapse 07/05/2017   COPD with acute exacerbation (HCC) 07/05/2017   Acute kidney injury superimposed on CKD (HCC) 07/05/2017   Urinary tract infectious disease 07/05/2017   Leukocytosis 07/05/2017   Incomplete bladder emptying 07/05/2017   UTI (urinary tract infection) 07/05/2017   Disorder of carotid artery (HCC) 06/25/2017   Orthostatic syncope    Syncope due to orthostatic hypotension 06/24/2017   Cardiac murmur 06/24/2017   Prolonged QT interval 06/24/2017   Imaging of gastrointestinal tract abnormal 05/25/2017   Basal cell carcinoma of eyelid 05/15/2017   Basal cell carcinoma (BCC) of nostril 05/12/2017   Skin lesion of face 05/03/2017   Other specified disorders of nose and nasal sinuses 05/03/2017   Periapical abscess 04/04/2017   Iron deficiency anemia  04/04/2017   Essential hypertension 04/02/2017   Anemia of chronic disease 04/02/2017   Acute hypoxemic respiratory failure (HCC) 04/02/2017   Lactic acidosis 04/02/2017   Tobacco use disorder 04/02/2017   Elevated troponin 04/02/2017   Thrombocytosis 04/02/2017   Hypokalemia 04/02/2017   Unspecified diastolic (congestive) heart failure (HCC) 04/02/2017   Nasal lesion    PCP:  Benita Stabile, MD Pharmacy:   [facility] - [place], [place] - [address] [place] [address] Phone: [phone] Fax: [phone]     Social Determinants of Health (SDOH) Social History: SDOH Screenings   Food Insecurity: No Food Insecurity (11/22/2022)  Housing: Low Risk  (11/22/2022)  Recent Concern: Housing - Medium Risk (11/13/2022)  Transportation Needs: No Transportation Needs (11/22/2022)  Utilities: Not At Risk (11/22/2022)  Alcohol Screen: Low Risk  (08/19/2022)  Depression (PHQ2-9): Low Risk  (08/19/2022)  Financial Resource Strain: Low Risk  (08/19/2022)  Physical Activity: Inactive (08/19/2022)  Social Connections: Unknown (08/19/2022)  Stress: No Stress Concern Present (08/19/2022)  Tobacco Use: Medium Risk (11/21/2022)  Health Literacy: Inadequate Health Literacy (08/19/2022)   SDOH Interventions:     Readmission Risk Interventions    11/22/2022    1:58 PM 07/25/2022    1:09 PM 04/26/2022   11:46 AM  Readmission Risk Prevention Plan  Transportation Screening Complete Complete Complete  HRI or  Home Care Consult   Complete  Social Work Consult for Recovery Care Planning/Counseling   Complete  Palliative Care Screening   Not Applicable  Medication Review Oceanographer) Complete Complete Complete  PCP or Specialist appointment within 3-5 days of discharge  Not Complete   HRI or Home Care Consult Complete Complete   SW Recovery Care/Counseling Consult Complete Complete   Palliative Care Screening Not Applicable Complete   Skilled Nursing  Facility Not Applicable Not Applicable

## 2022-11-22 NOTE — ED Notes (Signed)
Dr contacted in regards to consistently high blood pressures awaiting response

## 2022-11-23 DIAGNOSIS — I5023 Acute on chronic systolic (congestive) heart failure: Secondary | ICD-10-CM | POA: Diagnosis not present

## 2022-11-23 DIAGNOSIS — J9601 Acute respiratory failure with hypoxia: Secondary | ICD-10-CM | POA: Diagnosis not present

## 2022-11-23 DIAGNOSIS — F17200 Nicotine dependence, unspecified, uncomplicated: Secondary | ICD-10-CM | POA: Diagnosis not present

## 2022-11-23 DIAGNOSIS — R55 Syncope and collapse: Secondary | ICD-10-CM | POA: Diagnosis not present

## 2022-11-23 DIAGNOSIS — I5020 Unspecified systolic (congestive) heart failure: Secondary | ICD-10-CM | POA: Diagnosis not present

## 2022-11-23 DIAGNOSIS — J9621 Acute and chronic respiratory failure with hypoxia: Secondary | ICD-10-CM | POA: Diagnosis not present

## 2022-11-23 LAB — BASIC METABOLIC PANEL
Anion gap: 10 (ref 5–15)
BUN: 39 mg/dL — ABNORMAL HIGH (ref 8–23)
CO2: 23 mmol/L (ref 22–32)
Calcium: 8.2 mg/dL — ABNORMAL LOW (ref 8.9–10.3)
Chloride: 104 mmol/L (ref 98–111)
Creatinine, Ser: 2.2 mg/dL — ABNORMAL HIGH (ref 0.61–1.24)
GFR, Estimated: 32 mL/min — ABNORMAL LOW (ref >60)
Glucose, Bld: 211 mg/dL — ABNORMAL HIGH (ref 70–99)
Potassium: 4.2 mmol/L (ref 3.5–5.1)
Sodium: 137 mmol/L (ref 135–145)

## 2022-11-23 LAB — BLOOD GAS, VENOUS
Acid-Base Excess: 2.4 mmol/L — ABNORMAL HIGH (ref 0.0–2.0)
Bicarbonate: 26.4 mmol/L (ref 20.0–28.0)
Drawn by: 1528
O2 Saturation: 59.5 %
Patient temperature: 36.4
pCO2, Ven: 37 mm[Hg] — ABNORMAL LOW (ref 44–60)
pH, Ven: 7.46 — ABNORMAL HIGH (ref 7.25–7.43)
pO2, Ven: 36 mm[Hg] (ref 32–45)

## 2022-11-23 MED ORDER — IPRATROPIUM-ALBUTEROL 0.5-2.5 (3) MG/3ML IN SOLN
3.0000 mL | Freq: Two times a day (BID) | RESPIRATORY_TRACT | Status: DC
  Administered 2022-11-23 – 2022-11-25 (×4): 3 mL via RESPIRATORY_TRACT
  Filled 2022-11-23 (×5): qty 3

## 2022-11-23 MED ORDER — HYDRALAZINE HCL 20 MG/ML IJ SOLN
10.0000 mg | Freq: Four times a day (QID) | INTRAMUSCULAR | Status: DC | PRN
  Administered 2022-11-23: 10 mg via INTRAVENOUS
  Filled 2022-11-23: qty 1

## 2022-11-23 MED ORDER — HYDRALAZINE HCL 20 MG/ML IJ SOLN: 10.0000 mg | Freq: Four times a day (QID) | INTRAMUSCULAR | Status: DC | PRN

## 2022-11-23 MED ORDER — ASPIRIN 81 MG PO TBEC
81.0000 mg | DELAYED_RELEASE_TABLET | Freq: Every day | ORAL | Status: DC
  Administered 2022-11-23 – 2022-11-25 (×3): 81 mg via ORAL
  Filled 2022-11-23 (×3): qty 1

## 2022-11-23 MED ORDER — ENSURE ENLIVE PO LIQD
237.0000 mL | Freq: Two times a day (BID) | ORAL | Status: DC
  Administered 2022-11-23 (×2): 237 mL via ORAL

## 2022-11-23 NOTE — Consult Note (Addendum)
Cardiology Consultation   Patient ID: Michael [name] MRN: [id number]; DOB: March 23, 1955  Admit date: 11/21/2022 Date of Consult: 11/23/2022  PCP:  Benita Stabile, MD   [facility] Providers Cardiologist:  Nona Dell, MD        Patient Profile:   Michael [name] is a 67 y.o. male with a hx of HFrEF (EF 30% in 10/2021, at 35-40% in 04/2022, 25-30% in 07/2022 and 25% in 11/2022), CAD (cath in 10/2021 showing mild to moderate obstructive CAD along mid-RCA and acute Mrg with medical management recommended), carotid artery stenosis (s/p right TCAR in 01/2021), iron deficiency anemia, HTN (complicated by orthostatic hypotension), HLD, COPD and Stage 3 CKD  who is being seen 11/23/2022 for the evaluation of CHF and Syncope at the request of Dr. Arbutus Leas.  History of Present Illness:   Mr. Litterer was admitted for an acute CHF exacerbation in 08/2022 and medical management was limited given orthostatic hypotension. He was ultimately discharged on Torsemide 20 mg twice daily, Coreg 12.5 mg twice daily and Losartan 25 mg daily for his cardiomyopathy. He had a brief admission from 10/4 - 11/05/2022 for episodes of near syncope but left AMA. Was again admitted from 10/13 - 11/15/2022 for recurrent syncopal episodes. Did report minimal oral hydration and orthostatics were negative at that admission. Torsemide was reduced to 20 mg daily and Coreg was reduced to 6.25 mg twice daily given his first-degree AV block. Was recommended to obtain a 2-week Zio patch at discharge. Was followed by Palliative Care that admission as well and was made DNR.  Presented back to [facility] ED on 11/21/2022 for evaluation of dizziness, near syncope and shortness of breath. Was initially placed on BiPAP and transitioned to nonrebreather. Initial labs showed WBC 7.2, Hgb 11.7, platelets 272, Na+ 140, K+ 3.7 and creatinine 1.87 (previously at 2.41 on 11/15/2022). BNP greater than 4500 which is consistent with  prior values. Initial and repeat Hs Troponin values elevated at 98 and 103. CXR showed cardiac enlargement without changes. CTA showed no evidence of a PE. Was noted to have a small right pleural effusion and mild intralobular septal thickening suggestive of mild interstitial edema. EKG showed normal sinus rhythm, rate 90 with PVCs, baseline artifact, 1st degree AV block and LVH with repol abnormalities.   He has been restarted on Coreg 6.25 mg twice daily and has been receiving IV Lasix 40 mg twice daily. Recorded net output as +100 mL thus far.  Weight at 155 lbs this morning and readings from earlier this month were variable as he was listed as weighing 164 lbs and 148 lbs on the same day (11/13/2022).   He reports that he is staying with friends that has been compliant with his medications. Says he only consumes approximately 16-20 ounces of fluid a day. He reports shortness of breath when walking up the driveway for exercise but denies any chest pain or palpitations with this. No specific orthopnea, PND or pitting edema. Says that his heart monitor "fell off" and he is unsure where this is at.  Past Medical History:  Diagnosis Date   Aortic atherosclerosis (HCC) 08/03/2021   CAD (coronary artery disease) 08/03/2021   Cardiac catheterization September 2023 with RCA/RV marginal stenosis managed medically   Cardiomyopathy (HCC)    Carotid artery disease (HCC)    CKD (chronic kidney disease) stage 3, GFR 30-59 ml/min (HCC)    COPD (chronic obstructive pulmonary disease) (HCC)    Essential hypertension  Head and neck cancer (HCC) 2019   Right facial basal cell carcinoma s/p resection with right partial mastectomy and partal rhinectomy with skin graft (06/13/17)   Iron deficiency anemia    Urinary retention     Past Surgical History:  Procedure Laterality Date   ANKLE CLOSED REDUCTION Right    open reduction   BASAL CELL CARCINOMA EXCISION  2019   at [facility]   BIOPSY  07/19/2021   Procedure:  BIOPSY;  Surgeon: Lanelle Bal, DO;  Location: [facility];  Service: Endoscopy;;   COLONOSCOPY N/A 05/31/2017   Procedure: COLONOSCOPY;  Surgeon: Corbin Ade, MD;  Location: [facility];  Service: Endoscopy;  Laterality: N/A;  2:45pm   COLONOSCOPY WITH PROPOFOL N/A 07/19/2021   Procedure: COLONOSCOPY WITH PROPOFOL;  Surgeon: Lanelle Bal, DO;  Location: [facility];  Service: Endoscopy;  Laterality: N/A;  3:00pm, moved up to 9:00   CYSTOSCOPY N/A 10/29/2018   Procedure: CYSTOSCOPY, CLOT EVACUATION;  Surgeon: Rene Paci, MD;  Location: [facility];  Service: Urology;  Laterality: N/A;   PARTIAL COLECTOMY Right 08/11/2021   Procedure: PARTIAL COLECTOMY, OPEN RIGHT HEMICOLECTOMY;  Surgeon: Lucretia Roers, MD;  Location: [facility];  Service: General;  Laterality: Right;   POLYPECTOMY  05/31/2017   Procedure: POLYPECTOMY;  Surgeon: Corbin Ade, MD;  Location: [facility];  Service: Endoscopy;;   RIGHT/LEFT HEART CATH AND CORONARY ANGIOGRAPHY N/A 10/22/2021   Procedure: RIGHT/LEFT HEART CATH AND CORONARY ANGIOGRAPHY;  Surgeon: Orbie Pyo, MD;  Location: [facility];  Service: Cardiovascular;  Laterality: N/A;   TONSILLECTOMY     TRANSCAROTID ARTERY REVASCULARIZATION  Right 02/15/2021   Procedure: RIGHT TRANSCAROTID ARTERY REVASCULARIZATION;  Surgeon: Cephus Shelling, MD;  Location: [facility] OR;  Service: Vascular;  Laterality: Right;   ULTRASOUND GUIDANCE FOR VASCULAR ACCESS Left 02/15/2021   Procedure: ULTRASOUND GUIDANCE FOR VASCULAR ACCESS;  Surgeon: Cephus Shelling, MD;  Location: [facility] OR;  Service: Vascular;  Laterality: Left;   XI ROBOTIC ASSISTED SIMPLE PROSTATECTOMY N/A 10/29/2018   Procedure: XI ROBOTIC ASSISTED SIMPLE PROSTATECTOMY;  Surgeon: Malen Gauze, MD;  Location: [facility];  Service: Urology;  Laterality: N/A;     Home Medications:  Prior to Admission medications   Medication Sig Start Date End Date Taking? Authorizing Provider   albuterol (VENTOLIN HFA) 108 (90 Base) MCG/ACT inhaler Inhale 2 puffs into the lungs every 6 (six) hours as needed for wheezing or shortness of breath. 04/27/22  Yes Shahmehdi, Seyed A, MD  atorvastatin (LIPITOR) 40 MG tablet Take 1 tablet (40 mg total) by mouth every evening. Patient taking differently: Take 40 mg by mouth 2 (two) times daily. 08/01/22 11/22/22 Yes Shahmehdi, Gemma Payor, MD  Budeson-Glycopyrrol-Formoterol (BREZTRI AEROSPHERE) 160-9-4.8 MCG/ACT AERO Inhale 2 puffs into the lungs 2 (two) times daily.   Yes [provider]  carvedilol (COREG) 6.25 MG tablet Take 1 tablet (6.25 mg total) by mouth 2 (two) times daily with a meal. 11/15/22 12/15/22 Yes Shahmehdi, Seyed A, MD  clopidogrel (PLAVIX) 75 MG tablet Take 1 tablet (75 mg total) by mouth daily. 11/16/22  Yes Shahmehdi, Seyed A, MD  potassium chloride SA (KLOR-CON M) 20 MEQ tablet Take 1 tablet (20 mEq total) by mouth daily. 07/26/22 11/22/22 Yes Shahmehdi, Gemma Payor, MD  REPATHA SURECLICK 140 MG/ML SOAJ Inject 1 mL into the skin every 14 (fourteen) days. 10/19/22  Yes [provider]  torsemide (DEMADEX) 20 MG tablet Take 1 tablet (20 mg  total) by mouth daily. 11/16/22  Yes Shahmehdi, Gemma Payor, MD  lactobacillus acidophilus & bulgar (LACTINEX) chewable tablet Chew 1 tablet by mouth 3 (three) times daily with meals for 10 days. Patient not taking: Reported on 11/22/2022 11/15/22 11/25/22  Kendell Bane, MD  losartan (COZAAR) 25 MG tablet Take 1 tablet (25 mg total) by mouth daily. Patient not taking: Reported on 11/22/2022 11/15/22 12/15/22  Kendell Bane, MD    Inpatient Medications: Scheduled Meds:  arformoterol  15 mcg Nebulization BID   atorvastatin  40 mg Oral QPM   budesonide (PULMICORT) nebulizer solution  0.5 mg Nebulization BID   carvedilol  6.25 mg Oral BID WC   Chlorhexidine Gluconate Cloth  6 each Topical Q0600   clopidogrel  75 mg Oral Daily   feeding supplement  237 mL Oral BID BM    furosemide  40 mg Intravenous BID   heparin  5,000 Units Subcutaneous Q8H   ipratropium-albuterol  3 mL Nebulization BID   methylPREDNISolone (SOLU-MEDROL) injection  60 mg Intravenous BID   sodium chloride flush  10 mL Intravenous Q12H   sodium chloride flush  3 mL Intravenous Q12H    PRN Meds: acetaminophen, hydrALAZINE, prochlorperazine, sodium chloride flush  Allergies:   No Known Allergies  Social History:   Social History   Tobacco Use   Smoking status: Former    Current packs/day: 0.00    Average packs/day: 0.5 packs/day for 40.0 years (20.0 ttl pk-yrs)    Types: Cigarettes    Start date: 07/02/1981    Quit date: 07/02/2021    Years since quitting: 1.3    Passive exposure: Past   Smokeless tobacco: Never   Tobacco comments:    Quit on 06/09/21  Substance Use Topics   Alcohol use: Not Currently     Family History:    Family History  Problem Relation Age of Onset   Stroke Father    Cirrhosis Mother    Colon cancer Neg Hx      ROS:  Please see the history of present illness.  All other ROS reviewed and negative.     Physical Exam/Data:   Vitals:   11/23/22 0402 11/23/22 0500 11/23/22 0600 11/23/22 0742  BP: (!) 171/106 (!) 150/63 (!) 165/86   Pulse:  76 77 84  Resp: (!) 21 (!) 21 19   Temp:    98.1 F (36.7 C)  TempSrc:    Oral  SpO2: 100% 97% 96% 97%  Weight:  70.5 kg    Height:        Intake/Output Summary (Last 24 hours) at 11/23/2022 0753 Last data filed at 11/23/2022 9604 Gross per 24 hour  Intake 600 ml  Output 500 ml  Net 100 ml      11/23/2022    5:00 AM 11/22/2022    3:00 PM 11/13/2022   10:38 PM  Last 3 Weights  Weight (lbs) 155 lb 6.8 oz 152 lb 5.4 oz 148 lb 9.4 oz  Weight (kg) 70.5 kg 69.1 kg 67.4 kg     Body mass index is 21.08 kg/m.  General:  Thin male appearing in no acute distress HEENT: Prior reconstructive nasal surgery.  Neck: JVD at 9-10 cm.  Vascular: No carotid bruits; Distal pulses 2+ bilaterally Cardiac:  normal  S1, S2; RRR with occasional ectopic beats.  Lungs: Mild rales along bases. No wheezing.  Abd: soft, nontender, no hepatomegaly  Ext: no pitting edema Musculoskeletal:  No deformities, BUE and BLE strength normal and  equal Skin: warm and dry  Neuro:  CNs 2-12 intact, no focal abnormalities noted Psych:  Normal affect   EKG:  The EKG was personally reviewed and demonstrates: NSR, HR 90 with PVCs, baseline artifact, 1st degree AV block and LVH with repol abnormalities.   Telemetry:  Telemetry was personally reviewed and demonstrates: Normal sinus rhythm, heart rate in 70's to 80's with frequent PVC's and episodes of NSVT with the longest being 7 beats.  Relevant CV Studies:  Limited Echo: 11/2022 IMPRESSIONS     1. No change in severely reduced EF from TTE done 08/16/22 Limited echo No  mural apical thrombus. Left ventricular ejection fraction, by estimation,  is 25%. The left ventricle demonstrates global hypokinesis. The left  ventricular internal cavity size was   severely dilated.   2. Right ventricular systolic function is mildly reduced. The right  ventricular size is mildly enlarged.   3. The mitral valve is abnormal. Mild mitral valve regurgitation.   Carotid Dopplers: 11/2022 IMPRESSION: 1. Interval stenting of the right carotid bulb and internal carotid arteries without a residual or recurrent hemodynamically significant narrowing. 2. Moderate-to-large amount of left-sided atherosclerotic plaque, morphologically progressed compared to the 2019 examination though not resulting in elevated peak systolic velocities to suggest a hemodynamically significant stenosis. If clinical concern persists, further evaluation with CTA could be performed as indicated.  Laboratory Data:  High Sensitivity Troponin:   Recent Labs  Lab 11/04/22 1626 11/04/22 1832 11/13/22 1753 11/21/22 2349 11/22/22 0129  TROPONINIHS 92* 96* 88* 98* 103*     Chemistry Recent Labs  Lab  11/21/22 1501 11/23/22 0049  NA 140 137  K 3.7 4.2  CL 109 104  CO2 25 23  GLUCOSE 101* 211*  BUN 28* 39*  CREATININE 1.87* 2.20*  CALCIUM 8.6* 8.2*  GFRNONAA 39* 32*  ANIONGAP 6 10     Hematology Recent Labs  Lab 11/21/22 1501  WBC 7.2  RBC 4.34  HGB 11.7*  HCT 39.3  MCV 90.6  MCH 27.0  MCHC 29.8*  RDW 15.2  PLT 272   BNP Recent Labs  Lab 11/21/22 1501  BNP >4,500.0*     Radiology/Studies:  CT Angio Chest Pulmonary Embolism (PE) W or WO Contrast  Result Date: 11/22/2022 CLINICAL DATA:  Rule out pulmonary embolism. Dizziness 1 hour prior to arrival while sitting on callus. Patient also complains of shortness of breath Dr. Stefanie Libel thank you EXAM: CT ANGIOGRAPHY CHEST WITH CONTRAST TECHNIQUE: Multidetector CT imaging of the chest was performed using the standard protocol during bolus administration of intravenous contrast. Multiplanar CT image reconstructions and MIPs were obtained to evaluate the vascular anatomy. RADIATION DOSE REDUCTION: This exam was performed according to the departmental dose-optimization program which includes automated exposure control, adjustment of the mA and/or kV according to patient size and/or use of iterative reconstruction technique. CONTRAST:  75mL OMNIPAQUE IOHEXOL 350 MG/ML SOLN COMPARISON:  07/28/2022. FINDINGS: Cardiovascular: Satisfactory opacification of the pulmonary arteries to the segmental level. No evidence of pulmonary embolism. Aortic atherosclerosis. Three vessel coronary artery calcifications. Mild cardiac enlargement. Trace pericardial effusion. Mediastinum/Nodes: No enlarged mediastinal, hilar, or axillary lymph nodes. Thyroid gland, trachea, and esophagus demonstrate no significant findings. Lungs/Pleura: Small right pleural effusion. No airspace consolidation, atelectasis, or pneumothorax. No consolidative change. Mild interlobular septal thickening within the lower lung zones. 4 mm nodule identified within the right upper  lobe, image 25/6. Unchanged from 04/02/2017. This is compatible with a benign nodule requiring no further follow-up. Upper Abdomen: No acute abnormality.  Cyst identified within right lobe of liver is unchanged measuring 1.2 cm. Musculoskeletal: No chest wall abnormality. No acute or significant osseous findings. Review of the MIP images confirms the above findings. IMPRESSION: 1. No evidence for acute pulmonary embolus. 2. Small right pleural effusion. 3. Mild interlobular septal thickening within the lower lung zones suggestive of mild interstitial edema. 4. Coronary artery calcifications. 5.  Aortic Atherosclerosis (ICD10-I70.0). Electronically Signed   By: Signa Kell M.D.   On: 11/22/2022 06:56   DG Chest 2 View  Result Date: 11/22/2022 CLINICAL DATA:  Weakness.  Elevated blood pressure. EXAM: CHEST - 2 VIEW COMPARISON:  11/04/2022 FINDINGS: Diffuse cardiac enlargement. No change since prior study. No vascular congestion, edema, or consolidation. No pleural effusions. No pneumothorax. Mediastinal contours appear intact. Calcification of the aorta. Degenerative changes in the spine and shoulders. IMPRESSION: Cardiac enlargement without change.  Lungs are clear. Electronically Signed   By: Burman Nieves M.D.   On: 11/22/2022 00:28     Assessment and Plan:   1. Syncope - Episodes during his prior admission were felt to be due to orthostasis but vitals upon admission this time were nondiagnostic for this as BP went from 145/110 with sitting to 135/97 with standing. Unclear if he experienced any symptoms at that time. He has been consuming less than 20 ounces of fluid daily and we reviewed he should be consuming between 40-60 ounces of fluid daily.  - He is at risk for cardiac arrhythmias given his cardiomyopathy and unfortunately he lost his last ZIO monitor and is unable to locate this. Could consider replacement of this prior to discharge but again I am concerned about compliance with this as an  outpatient. Continue to follow on telemetry this admission.  2. Acute HFrEF  - His EF was at 25% by echocardiogram earlier this month that is similar to prior imaging earlier this year. BNP elevated to greater than 4500 on admission which is similar to prior values. Medical therapy for his cardiomyopathy has been limited given medication noncompliance, orthostatic symptoms and CKD.  - He is currently receiving IV Lasix 40 mg twice daily with minimal output recorded. Creatinine has increased from 1.87 on admission to 2.20 today. He received a dose of IV Lasix morning but will hold additional doses for now as he does not appear significantly volume overloaded by examination today. Can obtain a ReDS vest for further evaluation.  Continue Coreg 6.25mg  BID. Can hopefully restart Losartan 25 mg daily or Spirolactone at the time of discharge pending renal function and BP trend. BP previously did not allow for Entresto and he has not been on an SGLT2 inhibitor given his infection risk (UTI earlier this month).   3. CAD/Elevated Troponin Values - Prior cath in 10/2021 showed mild to moderate obstructive CAD along the mid-RCA and acute Mrg with medical management recommended. Hs troponin values have been mildly elevated at 98 and 103 this admission which is most consistent with demand ischemia as compared to ACS. He denies any recent chest pain. No plans for further ischemic evaluation at this time. - He has been on ASA and Plavix as it was recommended to continue DAPT for 1 year following his event in 10/2021. Will review with Dr. Diona Browner but anticipate Plavix could be discontinued and switched to ASA 81 mg daily. Continue Atorvastatin and Coreg. Also listed as being on Repatha as an outpatient.   4. Carotid Artery Stenosis - He is s/p right TCAR in 01/2021. Carotid Dopplers in 11/2022  showed no significant narrowing along the right internal carotid artery and moderate to large amount of left-sided  atherosclerotic plaque with recommendations for CTA as an outpatient. Needs to reestablish with Vascular as an outpatient.  5. Stage 3 CKD - Baseline creatinine 1.9 - 2.0. Peaked at 2.41 on 11/15/2022. At 1.87 on admission and trending up to 2.20 today. Will hold additional Lasix as discussed above.   6. Goals of Care - He was followed by Palliative Care last admission and made DNR. Currently Full Code. Would recommend confirming Code Status.    Risk Assessment/Risk Scores:        New York Heart Association (NYHA) Functional Class NYHA Class III     For questions or updates, please contact [facility] Please consult [email or web address] for contact info under    Signed, Ellsworth Lennox, PA-C  11/23/2022 7:53 AM   Attending note:  Patient seen and examined.  I reviewed his records including last few hospitalizations.  He presents now after a reported episode of syncope.  He tells me this morning that he was sitting on the porch at his friend's house where he currently lives, went to stand up and suddenly felt lightheaded and dizzy, fell to the floor and was there for a few minutes by his recollection.  EMS was called by a neighbor for further evaluation.  He does not report any recent chest pain or sense of palpitations, no progressive fluid retention.  A Zio patch had been provided after his last hospitalization, he states that he wore the patch for about a week but then lost it.  On examination this morning he reports no active symptoms.  He is afebrile, heart rate in the 70s to 80s with occasional PVCs and brief NSVT by telemetry.  Systolic blood pressures recently ranging 140s to 190s, he has not had a repeat orthostatic evaluation.  Lungs are clear with decreased breath sounds.  Cardiac exam with RRR and 2/6 systolic murmur.  No peripheral edema.  Pertinent lab work includes potassium 4.2, BUN 39, creatinine 2.2, high-sensitivity troponin I levels in the 90-100 range, BNP  greater than 4500, hemoglobin 11.7.  Chest CTA negative for pulmonary embolus.  Small right pleural effusion noted as well as mild interstitial edema and vascular calcifications.  ECG shows sinus rhythm with prolonged PR interval, LVH with IVCD, rule out old anterior infarct pattern.  Recent echocardiogram on October 5 revealed LVEF approximately 25% with global hypokinesis.  Agree with stopping Lasix, does not appear substantially fluid overloaded at this time.  Continue Coreg, Lipitor and switch from Plavix to ASA.  Would recheck orthostatic vital signs, orthostasis has not been a clear association with his presentations in all cases.  Still at risk for arrhythmia given substrate.  Eventually will plan to resume Cozaar most likely (did not tolerate Entresto previously and not a good candidate for SGLT2 inhibitor with recent UTI).  He should be able to move out of the unit.  Likely need to place new ZIO monitor at discharge.  Jonelle Sidle, M.D., F.A.C.C.

## 2022-11-23 NOTE — Progress Notes (Signed)
Progress Note   Patient: Michael [name] GMW:[id number] DOB: Sep 25, 1955 DOA: 11/21/2022     1 DOS: the patient was seen and examined on 11/23/2022   Brief hospital admission course: As per H&P written by Dr. Onalee Hua tat on 11/22/2022 Michael [name] is a 67 year old male with a history of HFrEF, orthostatic hypotension with recurrent syncope, CKD stage III, coronary disease, COPD, tobacco abuse, basal cell carcinoma status post partial rhinectomy and maxillectomy 06/06/2017 at [facility], advanced colonic tubular adenoma, right carotid stenosis, hypertension, hyperlipidemia presenting with syncope and shortness of breath.  Patient denies fevers, chills, headache, chest pain, dyspnea, nausea, vomiting, diarrhea, abdominal pain, dysuria, hematuria, hematochezia, and melena.  He states that he was sitting in his chair enjoying the sunshine when he lost consciousness for about 30 minutes.  He denies any bowel or bladder incontinence.  He did not bite his tongue.  He said that he felt some dizziness and lightheadedness after which she had the syncopal episode.  He continues to smoke 3 cigarettes/day.   The patient has had numerous hospitalizations for syncope, acute on chronic HFrEF and COPD ex.>>>   The patient was most recently discharged from the hospital after a stay from 11/13/2022 to 11/15/2022 for syncope.  The patient was discharged home with a Zio patch, but he states that it "fell off", and he does not know when it fell off.  Telemetry during his hospital admission did not show any dysrhythmias.   Hospitalized 11/04/2022 to 11/05/2022 -Secondary to near syncope, etiology unclear--discharged home on Coreg, losartan, torsemide -Troponins were flat, no concerning dysrhythmias on telemetry.   Hospitalized 08/15/2022 to 08/18/2022 -- Secondary to orthostatic hypotension and acute on chronic HFrEF   04/29/2022 to 05/03/2022 for syncope and COPD exacerbation. He was discharged with prednisone taper,  albuterol. It was felt his syncope was related to orthostasis. His Entresto and amlodipine were discontinued during that hospitalization. Prior to that, the patient was also hospitalized from 04/25/2022 to 04/27/2022 due to elevated troponin. This was thought to be related to demand ischemia. He also had some asymptomatic NSVT.     His medical treatment has been difficult secondary to his social situation and prior homelessness, poor compliance, and poor insight.   In the ED, the patient was afebrile and hemodynamically stable. He was initially placed on BiPAP and weaned to nonrebreather.  WBC 7.2, hemoglobin 11.7, platelets 272.  Sodium 140, potassium 2.7, bicarbonate 25, serum creatinine 1.87.  The patient was given DuoNebs, Solu-Medrol and Lasix 40 mg IV x 1.  Assessment and Plan: 1-acute respiratory failure with hypoxia -Appears to be secondary to COPD and CHF exacerbation -At time of admission patient was on nonrebreather -No chest pain -Has been able to be weaned off to room air -Continue treatment with steroids, bronchodilators, diuretics, low-sodium diet, daily weights and follow response.  2-acute on chronic systolic heart failure -Most recent EF 25% -Patient Michael [name] discontinue in April 2024 secondary to dizziness and orthostasis -Will continue judicious diuresis -Follow cardiology service recommendation. -Currently stable vital signs.  3-syncope -With high concerns for arrhythmias secondary to cardiomyopathy -Continue telemetry monitoring -Follow electrolytes and replete as needed -Follow cardiology service recommendation.  4-chronic kidney disease stage IIIb -Appears to be stable/at baseline -Continue to follow renal function trend with diuresis.  5-tobacco abuse -Cessation counseling provided -Patient declines nicotine patch and expressed that he quit about a month ago.  6-basal cell carcinoma of nasolabial fold -Status post rhinectomy status post skin graft and clear  margins -Continue patient  follow-up with [facility] dermatologist/facial surgeons.  Subjective:  No chest pain, no fever, no nausea, no vomiting.  Currently expressing no lightheadedness or syncope.  Expressed breathing is improving.  Physical Exam: Vitals:   11/23/22 1300 11/23/22 1345 11/23/22 1400 11/23/22 1545  BP: (!) 157/95  (!) 177/74 (!) 148/75  Pulse: 74 71 70 76  Resp: 16 (!) 22 17   Temp:      TempSrc:      SpO2: 95% 100% 96% 100%  Weight:      Height:       General exam: Alert, awake, oriented x 3; no chest pain, no nausea or vomiting. Respiratory system: Good air movement bilaterally; decreased breath sounds at the bases. Cardiovascular system: Rate controlled, no rubs, no gallops no JVD on exam. Gastrointestinal system: Abdomen is nondistended, soft and nontender. No organomegaly or masses felt. Normal bowel sounds heard. Central nervous system: Alert and oriented. No focal neurological deficits. Extremities: No cyanosis, clubbing or edema. Skin: No petechiae.  Skin graft and rhinectomy changes appreciated. Psychiatry: Judgement and insight appear normal. Mood & affect appropriate.    Data Reviewed: Basic metabolic panel: Sodium 137, potassium 4.2, chloride 104, bicarb 23, BUN 39, creatinine 2.2 and GFR 32.  Family Communication: No family at bedside  Disposition: Status is: Inpatient Remains inpatient appropriate because: Continue IV diuresis.   Planned Discharge Destination: Home  Time spent: 50 minutes  Author: Vassie Loll, MD 11/23/2022 5:23 PM  For on call review [email or web address].

## 2022-11-23 NOTE — Plan of Care (Signed)

## 2022-11-23 NOTE — Progress Notes (Signed)
Patient has been receiving lasix, had to be put on Bipap upon admission due to fluid overload.  Patient currently on RA, Bipap not needed at this time.  Will continue to monitor.

## 2022-11-23 NOTE — Progress Notes (Signed)
Per MD Adefeso, okay to hold off on Covid nasal swab due to nasal trauma/structure.

## 2022-11-24 DIAGNOSIS — F17200 Nicotine dependence, unspecified, uncomplicated: Secondary | ICD-10-CM | POA: Diagnosis not present

## 2022-11-24 DIAGNOSIS — R55 Syncope and collapse: Secondary | ICD-10-CM | POA: Diagnosis not present

## 2022-11-24 DIAGNOSIS — I5023 Acute on chronic systolic (congestive) heart failure: Secondary | ICD-10-CM | POA: Diagnosis not present

## 2022-11-24 DIAGNOSIS — I5020 Unspecified systolic (congestive) heart failure: Secondary | ICD-10-CM | POA: Diagnosis not present

## 2022-11-24 DIAGNOSIS — J9621 Acute and chronic respiratory failure with hypoxia: Secondary | ICD-10-CM | POA: Diagnosis not present

## 2022-11-24 DIAGNOSIS — J9601 Acute respiratory failure with hypoxia: Secondary | ICD-10-CM | POA: Diagnosis not present

## 2022-11-24 DIAGNOSIS — E440 Moderate protein-calorie malnutrition: Secondary | ICD-10-CM | POA: Insufficient documentation

## 2022-11-24 LAB — BASIC METABOLIC PANEL
Anion gap: 9 (ref 5–15)
BUN: 47 mg/dL — ABNORMAL HIGH (ref 8–23)
CO2: 23 mmol/L (ref 22–32)
Calcium: 8 mg/dL — ABNORMAL LOW (ref 8.9–10.3)
Chloride: 102 mmol/L (ref 98–111)
Creatinine, Ser: 2.06 mg/dL — ABNORMAL HIGH (ref 0.61–1.24)
GFR, Estimated: 34 mL/min — ABNORMAL LOW (ref >60)
Glucose, Bld: 332 mg/dL — ABNORMAL HIGH (ref 70–99)
Potassium: 3.6 mmol/L (ref 3.5–5.1)
Sodium: 134 mmol/L — ABNORMAL LOW (ref 135–145)

## 2022-11-24 LAB — MAGNESIUM: Magnesium: 2.2 mg/dL (ref 1.7–2.4)

## 2022-11-24 LAB — GLUCOSE, CAPILLARY
Glucose-Capillary: 360 mg/dL — ABNORMAL HIGH (ref 70–99)
Glucose-Capillary: 379 mg/dL — ABNORMAL HIGH (ref 70–99)
Glucose-Capillary: 326 mg/dL — ABNORMAL HIGH (ref 70–99)

## 2022-11-24 LAB — HEMOGLOBIN A1C
Hgb A1c MFr Bld: 5.4 % (ref 4.8–5.6)
Mean Plasma Glucose: 108.28 mg/dL

## 2022-11-24 MED ORDER — POTASSIUM CHLORIDE CRYS ER 20 MEQ PO TBCR
40.0000 meq | EXTENDED_RELEASE_TABLET | Freq: Once | ORAL | Status: AC
  Administered 2022-11-24: 40 meq via ORAL
  Filled 2022-11-24: qty 2

## 2022-11-24 MED ORDER — INSULIN GLARGINE-YFGN 100 UNIT/ML ~~LOC~~ SOLN
10.0000 [IU] | Freq: Every day | SUBCUTANEOUS | Status: DC
  Administered 2022-11-24 – 2022-11-25 (×2): 10 [IU] via SUBCUTANEOUS
  Filled 2022-11-24 (×3): qty 0.1

## 2022-11-24 MED ORDER — CARVEDILOL 3.125 MG PO TABS
9.3750 mg | ORAL_TABLET | Freq: Two times a day (BID) | ORAL | Status: DC
  Administered 2022-11-24 – 2022-11-25 (×2): 9.375 mg via ORAL
  Filled 2022-11-24 (×2): qty 3

## 2022-11-24 MED ORDER — ENSURE ENLIVE PO LIQD
237.0000 mL | Freq: Three times a day (TID) | ORAL | Status: DC
Start: 2022-11-24 — End: 2022-11-25
  Administered 2022-11-24 – 2022-11-25 (×3): 237 mL via ORAL

## 2022-11-24 MED ORDER — INSULIN ASPART 100 UNIT/ML IJ SOLN
0.0000 [IU] | Freq: Three times a day (TID) | INTRAMUSCULAR | Status: DC
  Administered 2022-11-24 (×2): 9 [IU] via SUBCUTANEOUS
  Administered 2022-11-25: 7 [IU] via SUBCUTANEOUS

## 2022-11-24 MED ORDER — CARVEDILOL 3.125 MG PO TABS
3.1250 mg | ORAL_TABLET | Freq: Once | ORAL | Status: AC
  Administered 2022-11-24: 3.125 mg via ORAL
  Filled 2022-11-24: qty 1

## 2022-11-24 MED ORDER — ADULT MULTIVITAMIN W/MINERALS CH
1.0000 | ORAL_TABLET | Freq: Every day | ORAL | Status: DC
  Administered 2022-11-24 – 2022-11-25 (×2): 1 via ORAL
  Filled 2022-11-24 (×2): qty 1

## 2022-11-24 NOTE — Progress Notes (Signed)
Initial Nutrition Assessment  DOCUMENTATION CODES:   Non-severe (moderate) malnutrition in context of chronic illness  INTERVENTION:   Ensure Plus High Protein po TID, each supplement provides 350 kcal and 20 grams of protein. MVI with minerals daily.  NUTRITION DIAGNOSIS:   Moderate Malnutrition related to chronic illness (CHF, COPD) as evidenced by mild muscle depletion, mild fat depletion.  GOAL:   Patient will meet greater than or equal to 90% of their needs  MONITOR:   PO intake, Supplement acceptance  REASON FOR ASSESSMENT:   Malnutrition Screening Tool    ASSESSMENT:   67 yo male admitted with syncope, SOB. PMH includes HF, orthostatic hypotension w/ recurrent syncope, CKD, CAD, COPD, tobacco abuse, basal cell carcinoma, advanced colonic tubular adenoma, HTN, HLD.  Patient reports recent poor appetite and decreased intake. Since admission, his appetite has improved a little. He likes Ensure supplements.    Labs reviewed. Na 134  Medications reviewed and include novolog, semglee, solumedrol.  Weight history reviewed. Weight has fluctuated over the past year from 60 kg to 73 kg, likely related to fluid retention from CHF. Currently 70.5 kg.  NUTRITION - FOCUSED PHYSICAL EXAM:  Flowsheet Row Most Recent Value  Orbital Region No depletion  Upper Arm Region Mild depletion  Thoracic and Lumbar Region Mild depletion  Buccal Region Mild depletion  Temple Region Mild depletion  Clavicle Bone Region Moderate depletion  Clavicle and Acromion Bone Region Moderate depletion  Scapular Bone Region Mild depletion  Dorsal Hand Mild depletion  Patellar Region Mild depletion  Anterior Thigh Region Mild depletion  Posterior Calf Region Mild depletion  Edema (RD Assessment) None  Hair Reviewed  Eyes Reviewed  Mouth Reviewed  Skin Reviewed  Nails Reviewed       Diet Order:   Diet Order             Diet Heart Room service appropriate? Yes; Fluid consistency: Thin   Diet effective now                   EDUCATION NEEDS:   Education needs have been addressed  Skin:  Skin Assessment: Reviewed RN Assessment  Last BM:  10/24  Height:   Ht Readings from Last 1 Encounters:  11/23/22 6' (1.829 m)    Weight:   Wt Readings from Last 1 Encounters:  11/24/22 70.5 kg    BMI:  Body mass index is 21.08 kg/m.  Estimated Nutritional Needs:   Kcal:  1950-2150  Protein:  85-100 gm  Fluid:  1.8-2 L   Gabriel Rainwater RD, LDN, CNSC Please refer to Amion for contact information.

## 2022-11-24 NOTE — Inpatient Diabetes Management (Signed)
Inpatient Diabetes Program Recommendations  AACE/ADA: New Consensus Statement on Inpatient Glycemic Control   Target Ranges:  Prepandial:   less than 140 mg/dL      Peak postprandial:   less than 180 mg/dL (1-2 hours)      Critically ill patients:  140 - 180 mg/dL    Latest Reference Range & Units 11/21/22 15:01 11/23/22 00:49 11/24/22 04:21  Glucose 70 - 99 mg/dL 161 (H) 096 (H) 045 (H)    Latest Reference Range & Units 08/09/21 08:52  Hemoglobin A1C 4.8 - 5.6 % 5.2    Review of Glycemic Control  Diabetes history: NO Outpatient Diabetes medications: NA Current orders for Inpatient glycemic control: None; Solumedrol 60 mg BID  Inpatient Diabetes Program Recommendations:    Insulin: Lab glucose 332 mg/dl today. If Solumedrol will be continued, please consider ordering CBGs AC&HS and Novolog 0-9 units AC&HS.  Thanks, Orlando Penner, RN, MSN, CDCES Diabetes Coordinator Inpatient Diabetes Program [phone] (Team Pager from 8am to 5pm)

## 2022-11-24 NOTE — Progress Notes (Signed)
Mobility Specialist Progress Note:    11/24/22 1045  Mobility  Activity Ambulated with assistance in hallway  Level of Assistance Contact guard assist, steadying assist  Assistive Device None  Distance Ambulated (ft) 200 ft  Range of Motion/Exercises Active;All extremities  Activity Response Tolerated well  Mobility Referral No  $Mobility charge 1 Mobility  Mobility Specialist Start Time (ACUTE ONLY) 1040  Mobility Specialist Stop Time (ACUTE ONLY) 1050  Mobility Specialist Time Calculation (min) (ACUTE ONLY) 10 min   Pt received attempting to ambulate at doorway. Agreeable to my assistance, required CGA to ambulate with no AD. Tolerated well, asx throughout. Returned pt to room, left sitting EOB. All needs met.   Lawerance Bach Mobility Specialist Please contact via Special educational needs teacher or  Rehab office at [phone]

## 2022-11-24 NOTE — Progress Notes (Addendum)
Patient Name: Michael [name] Date of Encounter: 11/24/2022 [facility] Cardiologist: Nona Dell, MD   Interval Summary  .    Ambulated around the unit 4 times last night without chest pain or dyspnea. No palpitations. Feels comfortable this morning.  Vital Signs .    Vitals:   11/24/22 0610 11/24/22 0821 11/24/22 0825 11/24/22 0827  BP: (!) 145/77     Pulse: 71     Resp:      Temp: (!) 97.5 F (36.4 C)     TempSrc: Oral     SpO2: 98% 97% 100% 100%  Weight:      Height:        Intake/Output Summary (Last 24 hours) at 11/24/2022 0858 Last data filed at 11/23/2022 2139 Gross per 24 hour  Intake 13 ml  Output 1225 ml  Net -1212 ml      11/24/2022    5:00 AM 11/23/2022    8:00 AM 11/23/2022    5:00 AM  Last 3 Weights  Weight (lbs) 155 lb 6.8 oz 155 lb 3.3 oz 155 lb 6.8 oz  Weight (kg) 70.5 kg 70.4 kg 70.5 kg      Telemetry/ECG    Telemetry - NSR, HR in 70's to 80's. Frequent PVC's and episodes of NSVT with the longest being 13 beats.  - Personally Reviewed  Limited Echo: 11/05/2022 IMPRESSIONS    1. No change in severely reduced EF from TTE done 08/16/22 Limited echo No  mural apical thrombus. Left ventricular ejection fraction, by estimation,  is 25%. The left ventricle demonstrates global hypokinesis. The left  ventricular internal cavity size was   severely dilated.   2. Right ventricular systolic function is mildly reduced. The right  ventricular size is mildly enlarged.   3. The mitral valve is abnormal. Mild mitral valve regurgitation.   Physical Exam .    GEN: Appearing in no acute distress.   Neck: No JVD Cardiac: RRR, no murmurs, rubs, or gallops.  Respiratory: Clear to auscultation bilaterally without wheezing or rales. GI: Soft, nontender, non-distended  MS: No pitting edema  Assessment & Plan .     1. Syncope - Unclear etiology as orthostatics non-diagnostic this admission. A Zio patch was previously in place but he  lost this and was not wearing the monitor at the time of his events. By review of telemetry, he does have episodes of NSVT with the longest being 13 beats thus far. Will titrate Coreg from 6.25mg  BID to 9.375mg  BID. Labs this AM show K+ 3.6 and Mg 2.2. Will order additional K+ supplementation to keep this ~ 4.0.  2. Acute HFrEF  - His EF was at 25% by echocardiogram earlier this month that is similar to prior imaging earlier this year. BNP elevated to greater than 4500 on admission which is similar to prior values. Medical therapy for his cardiomyopathy has been limited given medication noncompliance, orthostatic symptoms and CKD. Not on an SGLT2 inhibitor given recent UTI.  - Will plan to titrate Coreg as outlined above. Likely restart PO diuretic tomorrow. ReDS vest reading pending but appears euvolemic by examination today.    3. CAD/Elevated Troponin Values - Prior cath in 10/2021 showed mild to moderate obstructive CAD along the mid-RCA and acute Mrg with medical management recommended. Hs troponin values mildly elevated at 98 and 103 this admission. Not consistent with ACS.  - He has been on ASA and Plavix as it was recommended to continue DAPT for 1 year following  his event in 10/2021. Plavix stopped this admission since he completed a year of DAPT. Remains on ASA, statin and beta-blocker therapy.    4. Carotid Artery Stenosis - He is s/p right TCAR in 01/2021. Carotid Dopplers in 11/2022 showed no significant narrowing along the right internal carotid artery and moderate to large amount of left-sided atherosclerotic plaque with recommendations for CTA as an outpatient. Needs to follow-up with Vascular as an outpatient.    5. Stage 3 CKD - Baseline creatinine 1.9 - 2.0. At 2.20 yesterday and improving to 2.06 today.    6. Goals of Care - Previously DNR last admission but now Full Code. Would recommend readdressing Code Status.   For questions or updates, please contact   [facility] Please consult [email or web address] for contact info under      Signed, Ellsworth Lennox, PA-C    Attending note:  Patient seen and examined.  No acute events overnight.  Patient ambulated around the hall 4 times yesterday evening.  Denies chest pain, has had no dizziness or recurrent syncope while on the monitor.  Not found to be orthostatic yesterday.  On examination he is afebrile, heart rate in the 70s to 80s in sinus rhythm by telemetry which also shows NSVT, longest episode was 13 beats.  Lungs with decreased breath sounds.  Cardiac exam with RRR and no gallop.  Pertinent lab work includes potassium 3.6, creatinine down to 2.06.  Would check ReDS vest, hold off on diuretics for now.  Planning to uptitrate Coreg given NSVT and cardiomyopathy.  He is not a good candidate for ICD and will try and optimize GDMT as able, although noncompliance has been a recurring issue.  Hold off on resumption of ARB at this time.  Jonelle Sidle, M.D., F.A.C.C.

## 2022-11-24 NOTE — Plan of Care (Signed)
  Problem: Education: Goal: Knowledge of General Education information will improve Description: Including pain rating scale, medication(s)/side effects and non-pharmacologic comfort measures Outcome: Progressing   Problem: Health Behavior/Discharge Planning: Goal: Ability to manage health-related needs will improve Outcome: Progressing   Problem: Clinical Measurements: Goal: Ability to maintain clinical measurements within normal limits will improve Outcome: Progressing   Problem: Clinical Measurements: Goal: Diagnostic test results will improve Outcome: Progressing   

## 2022-11-24 NOTE — Plan of Care (Signed)

## 2022-11-24 NOTE — Progress Notes (Signed)
Progress Note   Patient: Michael [name] [id number]:[id number] DOB: 17-Nov-1955 DOA: 11/21/2022     2 DOS: the patient was seen and examined on 11/24/2022   Brief hospital admission course: As per H&P written by Dr. Onalee Hua tat on 11/22/2022 Michael [name] is a 67 year old male with a history of HFrEF, orthostatic hypotension with recurrent syncope, CKD stage III, coronary disease, COPD, tobacco abuse, basal cell carcinoma status post partial rhinectomy and maxillectomy 06/06/2017 at [facility], advanced colonic tubular adenoma, right carotid stenosis, hypertension, hyperlipidemia presenting with syncope and shortness of breath.  Patient denies fevers, chills, headache, chest pain, dyspnea, nausea, vomiting, diarrhea, abdominal pain, dysuria, hematuria, hematochezia, and melena.  He states that he was sitting in his chair enjoying the sunshine when he lost consciousness for about 30 minutes.  He denies any bowel or bladder incontinence.  He did not bite his tongue.  He said that he felt some dizziness and lightheadedness after which she had the syncopal episode.  He continues to smoke 3 cigarettes/day.   The patient has had numerous hospitalizations for syncope, acute on chronic HFrEF and COPD ex.>>>   The patient was most recently discharged from the hospital after a stay from 11/13/2022 to 11/15/2022 for syncope.  The patient was discharged home with a Zio patch, but he states that it "fell off", and he does not know when it fell off.  Telemetry during his hospital admission did not show any dysrhythmias.   Hospitalized 11/04/2022 to 11/05/2022 -Secondary to near syncope, etiology unclear--discharged home on Coreg, losartan, torsemide -Troponins were flat, no concerning dysrhythmias on telemetry.   Hospitalized 08/15/2022 to 08/18/2022 -- Secondary to orthostatic hypotension and acute on chronic HFrEF   04/29/2022 to 05/03/2022 for syncope and COPD exacerbation. He was discharged with prednisone taper,  albuterol. It was felt his syncope was related to orthostasis. His Entresto and amlodipine were discontinued during that hospitalization. Prior to that, the patient was also hospitalized from 04/25/2022 to 04/27/2022 due to elevated troponin. This was thought to be related to demand ischemia. He also had some asymptomatic NSVT.     His medical treatment has been difficult secondary to his social situation and prior homelessness, poor compliance, and poor insight.   In the ED, the patient was afebrile and hemodynamically stable. He was initially placed on BiPAP and weaned to nonrebreather.  WBC 7.2, hemoglobin 11.7, platelets 272.  Sodium 140, potassium 2.7, bicarbonate 25, serum creatinine 1.87.  The patient was given DuoNebs, Solu-Medrol and Lasix 40 mg IV x 1.  Assessment and Plan: 1-acute respiratory failure with hypoxia -Appears to be secondary to COPD and CHF exacerbation -At time of admission patient was on nonrebreather -No chest pain -Has been able to be weaned off to room air -Continue treatment with steroids, bronchodilators, diuretics, low-sodium diet, daily weights and follow response. --continue to follow respiratory system stability.  2-acute on chronic systolic heart failure -Most recent EF 25% -Patient Sherryll Burger discontinue in April 2024 secondary to dizziness and orthostasis -Will continue judicious diuresis; hopefully transition to oral route at discharge on 11/25/2022 -Continue to follow cardiology service recommendation. -Currently stable vital signs. -check REDS readings in am.  3-syncope -With high concerns for arrhythmias secondary to cardiomyopathy -Continue telemetry monitoring -Follow electrolytes and further replete as needed; goal is for potassium above 4 and magnesium above 2. -Continue to follow cardiology service recommendation. -Patient's carvedilol dose will be adjusted titrating up to 9.375 mg twice a day.  4-chronic kidney disease stage IIIb -Appears to  be stable/at baseline -Continue to follow renal function trend with diuresis.  5-tobacco abuse -Cessation counseling provided -Patient declines nicotine patch and expressed that he quit about a month ago.  6-basal cell carcinoma of nasolabial fold -Status post rhinectomy status post skin graft and clear margins -Continue patient follow-up with [facility] dermatologist/facial surgeons.  Subjective:  No chest pain, no nausea, no vomiting, no fever.  Reports breathing significantly improved and is currently no acute distress.  Physical Exam: Vitals:   11/24/22 0821 11/24/22 0825 11/24/22 0827 11/24/22 1300  BP:    (!) 159/97  Pulse:    77  Resp:      Temp:    97.9 F (36.6 C)  TempSrc:    Oral  SpO2: 97% 100% 100% 98%  Weight:      Height:       General exam: Alert, awake, oriented x 3; reports breathing easier and no complaining of chest pain, dizziness or lightheadedness. Respiratory system: Improving movement bilaterally; no wheezing or crackles.  Good saturation on room air. Cardiovascular system: Rate controlled, no rubs, no gallops, no JVD. Gastrointestinal system: Abdomen is nondistended, soft and nontender. No organomegaly or masses felt. Normal bowel sounds heard. Central nervous system: No focal neurological deficits. Extremities: No cyanosis or clubbing; no edema. Skin: No petechiae; skin graft and retinectomy changes appreciated in his face. Psychiatry: Judgement and insight appear normal. Mood & affect appropriate.   Data Reviewed: Basic metabolic panel: Sodium 137, potassium 4.2, chloride 104, bicarb 23, BUN 39, creatinine 2.2 and GFR 32.  Family Communication: No family at bedside  Disposition: Status is: Inpatient Remains inpatient appropriate because: Continue IV diuresis.   Planned Discharge Destination: Home  Time spent: 50 minutes  Author: Vassie Loll, MD 11/24/2022 5:05 PM  For on call review [email or web address].

## 2022-11-25 DIAGNOSIS — I5020 Unspecified systolic (congestive) heart failure: Secondary | ICD-10-CM | POA: Diagnosis not present

## 2022-11-25 DIAGNOSIS — Z87898 Personal history of other specified conditions: Secondary | ICD-10-CM

## 2022-11-25 DIAGNOSIS — E440 Moderate protein-calorie malnutrition: Secondary | ICD-10-CM

## 2022-11-25 DIAGNOSIS — J9601 Acute respiratory failure with hypoxia: Secondary | ICD-10-CM | POA: Diagnosis not present

## 2022-11-25 DIAGNOSIS — I951 Orthostatic hypotension: Secondary | ICD-10-CM

## 2022-11-25 DIAGNOSIS — F17200 Nicotine dependence, unspecified, uncomplicated: Secondary | ICD-10-CM | POA: Diagnosis not present

## 2022-11-25 LAB — BASIC METABOLIC PANEL
Anion gap: 8 (ref 5–15)
BUN: 50 mg/dL — ABNORMAL HIGH (ref 8–23)
CO2: 24 mmol/L (ref 22–32)
Calcium: 8.3 mg/dL — ABNORMAL LOW (ref 8.9–10.3)
Chloride: 104 mmol/L (ref 98–111)
Creatinine, Ser: 1.93 mg/dL — ABNORMAL HIGH (ref 0.61–1.24)
GFR, Estimated: 37 mL/min — ABNORMAL LOW (ref >60)
Glucose, Bld: 300 mg/dL — ABNORMAL HIGH (ref 70–99)
Potassium: 3.9 mmol/L (ref 3.5–5.1)
Sodium: 136 mmol/L (ref 135–145)

## 2022-11-25 LAB — GLUCOSE, CAPILLARY: Glucose-Capillary: 308 mg/dL — ABNORMAL HIGH (ref 70–99)

## 2022-11-25 MED ORDER — TORSEMIDE 20 MG PO TABS: 20.0000 mg | ORAL_TABLET | Freq: Every day | ORAL | Status: DC

## 2022-11-25 MED ORDER — CARVEDILOL 12.5 MG PO TABS: 12.5000 mg | ORAL_TABLET | Freq: Two times a day (BID) | ORAL | Status: DC

## 2022-11-25 MED ORDER — DM-GUAIFENESIN ER 30-600 MG PO TB12: 1.0000 | ORAL_TABLET | Freq: Two times a day (BID) | ORAL | 0 refills | Status: AC

## 2022-11-25 MED ORDER — ADULT MULTIVITAMIN W/MINERALS CH: 1.0000 | ORAL_TABLET | Freq: Every day | ORAL | Status: DC

## 2022-11-25 MED ORDER — PANTOPRAZOLE SODIUM 40 MG PO TBEC: 40.0000 mg | DELAYED_RELEASE_TABLET | Freq: Every day | ORAL | 1 refills | Status: DC

## 2022-11-25 MED ORDER — PREDNISONE 20 MG PO TABS: ORAL_TABLET | ORAL | 0 refills | Status: DC

## 2022-11-25 MED ORDER — ENSURE ENLIVE PO LIQD: 237.0000 mL | Freq: Three times a day (TID) | ORAL | Status: DC

## 2022-11-25 MED ORDER — POTASSIUM CHLORIDE CRYS ER 20 MEQ PO TBCR: 20.0000 meq | EXTENDED_RELEASE_TABLET | Freq: Every day | ORAL | Status: DC

## 2022-11-25 MED ORDER — CARVEDILOL 12.5 MG PO TABS: 12.5000 mg | ORAL_TABLET | Freq: Two times a day (BID) | ORAL | 1 refills | Status: DC

## 2022-11-25 MED ORDER — ASPIRIN 81 MG PO TBEC: 81.0000 mg | DELAYED_RELEASE_TABLET | Freq: Every day | ORAL | 12 refills | Status: DC

## 2022-11-25 MED ORDER — CARVEDILOL 3.125 MG PO TABS: 9.3750 mg | ORAL_TABLET | Freq: Two times a day (BID) | ORAL | 1 refills | Status: DC

## 2022-11-25 NOTE — Discharge Summary (Addendum)
Physician Discharge Summary   Patient: Michael [name] MRN: [id number] DOB: [birth year]  Admit date:     11/21/2022  Discharge date: 11/25/22  Discharge Physician: Vassie Loll   PCP: Benita Stabile, MD   Recommendations at discharge:  Repeat basic metabolic panel to follow electrolytes and renal function Repeat CBC to assess hemoglobin trend/stability Reassess blood pressure and adjust antihypertensive regimen as needed Continue assisting patient staying tobacco free.  Discharge Diagnoses: Principal Problem:   Acute respiratory failure with hypoxia (HCC) Active Problems:   Tobacco use disorder   Syncope due to orthostatic hypotension   Syncope and collapse   COPD with acute exacerbation (HCC)   Acute on chronic HFrEF (heart failure with reduced ejection fraction) (HCC)   Malnutrition of moderate degree  Brief hospital admission course: As per H&P written by Dr. Onalee Hua tat on 11/22/2022 Michael [name] is a 67 year old male with a history of HFrEF, orthostatic hypotension with recurrent syncope, CKD stage III, coronary disease, COPD, tobacco abuse, basal cell carcinoma status post partial rhinectomy and maxillectomy 06/06/2017 at [facility], advanced colonic tubular adenoma, right carotid stenosis, hypertension, hyperlipidemia presenting with syncope and shortness of breath.  Patient denies fevers, chills, headache, chest pain, dyspnea, nausea, vomiting, diarrhea, abdominal pain, dysuria, hematuria, hematochezia, and melena.  He states that he was sitting in his chair enjoying the sunshine when he lost consciousness for about 30 minutes.  He denies any bowel or bladder incontinence.  He did not bite his tongue.  He said that he felt some dizziness and lightheadedness after which she had the syncopal episode.  He continues to smoke 3 cigarettes/day.   The patient has had numerous hospitalizations for syncope, acute on chronic HFrEF and COPD ex.>>>   The patient was most recently  discharged from the hospital after a stay from 11/13/2022 to 11/15/2022 for syncope.  The patient was discharged home with a Zio patch, but he states that it "fell off", and he does not know when it fell off.  Telemetry during his hospital admission did not show any dysrhythmias.   Hospitalized 11/04/2022 to 11/05/2022 -Secondary to near syncope, etiology unclear--discharged home on Coreg, losartan, torsemide -Troponins were flat, no concerning dysrhythmias on telemetry.   Hospitalized 08/15/2022 to 08/18/2022 -- Secondary to orthostatic hypotension and acute on chronic HFrEF   04/29/2022 to 05/03/2022 for syncope and COPD exacerbation. He was discharged with prednisone taper, albuterol. It was felt his syncope was related to orthostasis. His Entresto and amlodipine were discontinued during that hospitalization. Prior to that, the patient was also hospitalized from 04/25/2022 to 04/27/2022 due to elevated troponin. This was thought to be related to demand ischemia. He also had some asymptomatic NSVT.     His medical treatment has been difficult secondary to his social situation and prior homelessness, poor compliance, and poor insight.   In the ED, the patient was afebrile and hemodynamically stable. He was initially placed on BiPAP and weaned to nonrebreather.  WBC 7.2, hemoglobin 11.7, platelets 272.  Sodium 140, potassium 2.7, bicarbonate 25, serum creatinine 1.87.  The patient was given DuoNebs, Solu-Medrol and Lasix 40 mg IV x 1.  Assessment and Plan: 1-acute respiratory failure with hypoxia -Appears to be secondary to COPD and CHF exacerbation -At time of admission patient was requiring a nonrebreather -No chest pain -Has been able to be weaned off to room air -Continue treatment with steroids tapering, bronchodilators, diuretics, low-sodium diet, daily weights and mucolytics.. --continue to follow respiratory system stability.   2-acute on  of left-sided atherosclerotic plaque, morphologically progressed compared to the 2019 examination though not resulting in elevated peak systolic velocities to suggest a hemodynamically significant stenosis. If clinical concern persists, further evaluation with CTA could be performed as indicated. Electronically Signed   By: Simonne Come M.D.   On: 11/05/2022 16:03   ECHOCARDIOGRAM LIMITED  Result Date: 11/05/2022    ECHOCARDIOGRAM LIMITED REPORT   [name] Date of Exam: 11/05/2022 Medical Rec #:  [id number]     Height:       72.0 in Accession #:    [id number]    Weight:       144.0 lb Date of Birth:  [birth year]     BSA:          1.853 m Patient Age:    67 years      BP:           126/87 mmHg Patient Gender: M             HR:           62 bpm. Exam Location:  Inpatient Procedure: Cardiac Doppler, Color Doppler and Limited Echo Indications:    Syncope R55  History:        Patient has prior history of Echocardiogram examinations, most                 recent 08/16/2022. Cardiomyopathy, CAD, COPD; Risk                 Factors:Hypertension.  Sonographer:    Harriette Bouillon RDCS Referring Phys: [id number] OLADAPO ADEFESO IMPRESSIONS  1. No change in severely reduced EF from TTE done 08/16/22 Limited echo No mural apical thrombus. Left ventricular ejection fraction, by estimation, is 25%. The left ventricle demonstrates global hypokinesis. The left ventricular internal cavity size was  severely dilated.  2. Right ventricular systolic function is mildly reduced. The right ventricular size is mildly enlarged.  3. The mitral valve is abnormal. Mild mitral valve regurgitation. FINDINGS  Left Ventricle: No change in severely reduced EF from TTE done 08/16/22 Limited echo No mural apical thrombus. Left ventricular ejection fraction, by estimation, is 25%. The left ventricle demonstrates global  hypokinesis. The left ventricular internal cavity size was severely dilated. Right Ventricle: The right ventricular size is mildly enlarged. Right ventricular systolic function is mildly reduced. Pericardium: There is no evidence of pericardial effusion. Mitral Valve: The mitral valve is abnormal. There is mild thickening of the mitral valve leaflet(s). Mild mitral valve regurgitation. Tricuspid Valve: Tricuspid valve regurgitation is mild. LEFT VENTRICLE PLAX 2D LVIDd:         6.80 cm LVIDs:         6.50 cm LV PW:         0.90 cm LV IVS:        0.90 cm  Charlton Haws MD Electronically signed by Charlton Haws MD Signature Date/Time: 11/05/2022/1:33:22 PM    Final    DG Chest Port 1 View  Result Date: 11/04/2022 CLINICAL DATA:  Syncope.  Shortness of breath. EXAM: PORTABLE CHEST 1 VIEW COMPARISON:  08/15/2022 FINDINGS: Chronic cardiomegaly. Stable mediastinal contours. Previous lower lobe opacities have resolved. Chronic interstitial coarsening. No convincing pulmonary edema. No pneumothorax. No large pleural effusion. IMPRESSION: 1. Chronic cardiomegaly. 2. Previous bilateral airspace opacities have resolved. No acute findings. Electronically Signed   By: Narda Rutherford M.D.   On: 11/04/2022 19:05    Microbiology: Results for orders placed or performed during the  Physician Discharge Summary   Patient: Michael [name] MRN: [id number] DOB: [birth year]  Admit date:     11/21/2022  Discharge date: 11/25/22  Discharge Physician: Vassie Loll   PCP: Benita Stabile, MD   Recommendations at discharge:  Repeat basic metabolic panel to follow electrolytes and renal function Repeat CBC to assess hemoglobin trend/stability Reassess blood pressure and adjust antihypertensive regimen as needed Continue assisting patient staying tobacco free.  Discharge Diagnoses: Principal Problem:   Acute respiratory failure with hypoxia (HCC) Active Problems:   Tobacco use disorder   Syncope due to orthostatic hypotension   Syncope and collapse   COPD with acute exacerbation (HCC)   Acute on chronic HFrEF (heart failure with reduced ejection fraction) (HCC)   Malnutrition of moderate degree  Brief hospital admission course: As per H&P written by Dr. Onalee Hua tat on 11/22/2022 Michael [name] is a 67 year old male with a history of HFrEF, orthostatic hypotension with recurrent syncope, CKD stage III, coronary disease, COPD, tobacco abuse, basal cell carcinoma status post partial rhinectomy and maxillectomy 06/06/2017 at [facility], advanced colonic tubular adenoma, right carotid stenosis, hypertension, hyperlipidemia presenting with syncope and shortness of breath.  Patient denies fevers, chills, headache, chest pain, dyspnea, nausea, vomiting, diarrhea, abdominal pain, dysuria, hematuria, hematochezia, and melena.  He states that he was sitting in his chair enjoying the sunshine when he lost consciousness for about 30 minutes.  He denies any bowel or bladder incontinence.  He did not bite his tongue.  He said that he felt some dizziness and lightheadedness after which she had the syncopal episode.  He continues to smoke 3 cigarettes/day.   The patient has had numerous hospitalizations for syncope, acute on chronic HFrEF and COPD ex.>>>   The patient was most recently  discharged from the hospital after a stay from 11/13/2022 to 11/15/2022 for syncope.  The patient was discharged home with a Zio patch, but he states that it "fell off", and he does not know when it fell off.  Telemetry during his hospital admission did not show any dysrhythmias.   Hospitalized 11/04/2022 to 11/05/2022 -Secondary to near syncope, etiology unclear--discharged home on Coreg, losartan, torsemide -Troponins were flat, no concerning dysrhythmias on telemetry.   Hospitalized 08/15/2022 to 08/18/2022 -- Secondary to orthostatic hypotension and acute on chronic HFrEF   04/29/2022 to 05/03/2022 for syncope and COPD exacerbation. He was discharged with prednisone taper, albuterol. It was felt his syncope was related to orthostasis. His Entresto and amlodipine were discontinued during that hospitalization. Prior to that, the patient was also hospitalized from 04/25/2022 to 04/27/2022 due to elevated troponin. This was thought to be related to demand ischemia. He also had some asymptomatic NSVT.     His medical treatment has been difficult secondary to his social situation and prior homelessness, poor compliance, and poor insight.   In the ED, the patient was afebrile and hemodynamically stable. He was initially placed on BiPAP and weaned to nonrebreather.  WBC 7.2, hemoglobin 11.7, platelets 272.  Sodium 140, potassium 2.7, bicarbonate 25, serum creatinine 1.87.  The patient was given DuoNebs, Solu-Medrol and Lasix 40 mg IV x 1.  Assessment and Plan: 1-acute respiratory failure with hypoxia -Appears to be secondary to COPD and CHF exacerbation -At time of admission patient was requiring a nonrebreather -No chest pain -Has been able to be weaned off to room air -Continue treatment with steroids tapering, bronchodilators, diuretics, low-sodium diet, daily weights and mucolytics.. --continue to follow respiratory system stability.   2-acute on  of left-sided atherosclerotic plaque, morphologically progressed compared to the 2019 examination though not resulting in elevated peak systolic velocities to suggest a hemodynamically significant stenosis. If clinical concern persists, further evaluation with CTA could be performed as indicated. Electronically Signed   By: Simonne Come M.D.   On: 11/05/2022 16:03   ECHOCARDIOGRAM LIMITED  Result Date: 11/05/2022    ECHOCARDIOGRAM LIMITED REPORT   [name] Date of Exam: 11/05/2022 Medical Rec #:  [id number]     Height:       72.0 in Accession #:    [id number]    Weight:       144.0 lb Date of Birth:  [birth year]     BSA:          1.853 m Patient Age:    67 years      BP:           126/87 mmHg Patient Gender: M             HR:           62 bpm. Exam Location:  Inpatient Procedure: Cardiac Doppler, Color Doppler and Limited Echo Indications:    Syncope R55  History:        Patient has prior history of Echocardiogram examinations, most                 recent 08/16/2022. Cardiomyopathy, CAD, COPD; Risk                 Factors:Hypertension.  Sonographer:    Harriette Bouillon RDCS Referring Phys: [id number] OLADAPO ADEFESO IMPRESSIONS  1. No change in severely reduced EF from TTE done 08/16/22 Limited echo No mural apical thrombus. Left ventricular ejection fraction, by estimation, is 25%. The left ventricle demonstrates global hypokinesis. The left ventricular internal cavity size was  severely dilated.  2. Right ventricular systolic function is mildly reduced. The right ventricular size is mildly enlarged.  3. The mitral valve is abnormal. Mild mitral valve regurgitation. FINDINGS  Left Ventricle: No change in severely reduced EF from TTE done 08/16/22 Limited echo No mural apical thrombus. Left ventricular ejection fraction, by estimation, is 25%. The left ventricle demonstrates global  hypokinesis. The left ventricular internal cavity size was severely dilated. Right Ventricle: The right ventricular size is mildly enlarged. Right ventricular systolic function is mildly reduced. Pericardium: There is no evidence of pericardial effusion. Mitral Valve: The mitral valve is abnormal. There is mild thickening of the mitral valve leaflet(s). Mild mitral valve regurgitation. Tricuspid Valve: Tricuspid valve regurgitation is mild. LEFT VENTRICLE PLAX 2D LVIDd:         6.80 cm LVIDs:         6.50 cm LV PW:         0.90 cm LV IVS:        0.90 cm  Charlton Haws MD Electronically signed by Charlton Haws MD Signature Date/Time: 11/05/2022/1:33:22 PM    Final    DG Chest Port 1 View  Result Date: 11/04/2022 CLINICAL DATA:  Syncope.  Shortness of breath. EXAM: PORTABLE CHEST 1 VIEW COMPARISON:  08/15/2022 FINDINGS: Chronic cardiomegaly. Stable mediastinal contours. Previous lower lobe opacities have resolved. Chronic interstitial coarsening. No convincing pulmonary edema. No pneumothorax. No large pleural effusion. IMPRESSION: 1. Chronic cardiomegaly. 2. Previous bilateral airspace opacities have resolved. No acute findings. Electronically Signed   By: Narda Rutherford M.D.   On: 11/04/2022 19:05    Microbiology: Results for orders placed or performed during the  of left-sided atherosclerotic plaque, morphologically progressed compared to the 2019 examination though not resulting in elevated peak systolic velocities to suggest a hemodynamically significant stenosis. If clinical concern persists, further evaluation with CTA could be performed as indicated. Electronically Signed   By: Simonne Come M.D.   On: 11/05/2022 16:03   ECHOCARDIOGRAM LIMITED  Result Date: 11/05/2022    ECHOCARDIOGRAM LIMITED REPORT   [name] Date of Exam: 11/05/2022 Medical Rec #:  [id number]     Height:       72.0 in Accession #:    [id number]    Weight:       144.0 lb Date of Birth:  [birth year]     BSA:          1.853 m Patient Age:    67 years      BP:           126/87 mmHg Patient Gender: M             HR:           62 bpm. Exam Location:  Inpatient Procedure: Cardiac Doppler, Color Doppler and Limited Echo Indications:    Syncope R55  History:        Patient has prior history of Echocardiogram examinations, most                 recent 08/16/2022. Cardiomyopathy, CAD, COPD; Risk                 Factors:Hypertension.  Sonographer:    Harriette Bouillon RDCS Referring Phys: [id number] OLADAPO ADEFESO IMPRESSIONS  1. No change in severely reduced EF from TTE done 08/16/22 Limited echo No mural apical thrombus. Left ventricular ejection fraction, by estimation, is 25%. The left ventricle demonstrates global hypokinesis. The left ventricular internal cavity size was  severely dilated.  2. Right ventricular systolic function is mildly reduced. The right ventricular size is mildly enlarged.  3. The mitral valve is abnormal. Mild mitral valve regurgitation. FINDINGS  Left Ventricle: No change in severely reduced EF from TTE done 08/16/22 Limited echo No mural apical thrombus. Left ventricular ejection fraction, by estimation, is 25%. The left ventricle demonstrates global  hypokinesis. The left ventricular internal cavity size was severely dilated. Right Ventricle: The right ventricular size is mildly enlarged. Right ventricular systolic function is mildly reduced. Pericardium: There is no evidence of pericardial effusion. Mitral Valve: The mitral valve is abnormal. There is mild thickening of the mitral valve leaflet(s). Mild mitral valve regurgitation. Tricuspid Valve: Tricuspid valve regurgitation is mild. LEFT VENTRICLE PLAX 2D LVIDd:         6.80 cm LVIDs:         6.50 cm LV PW:         0.90 cm LV IVS:        0.90 cm  Charlton Haws MD Electronically signed by Charlton Haws MD Signature Date/Time: 11/05/2022/1:33:22 PM    Final    DG Chest Port 1 View  Result Date: 11/04/2022 CLINICAL DATA:  Syncope.  Shortness of breath. EXAM: PORTABLE CHEST 1 VIEW COMPARISON:  08/15/2022 FINDINGS: Chronic cardiomegaly. Stable mediastinal contours. Previous lower lobe opacities have resolved. Chronic interstitial coarsening. No convincing pulmonary edema. No pneumothorax. No large pleural effusion. IMPRESSION: 1. Chronic cardiomegaly. 2. Previous bilateral airspace opacities have resolved. No acute findings. Electronically Signed   By: Narda Rutherford M.D.   On: 11/04/2022 19:05    Microbiology: Results for orders placed or performed during the  of left-sided atherosclerotic plaque, morphologically progressed compared to the 2019 examination though not resulting in elevated peak systolic velocities to suggest a hemodynamically significant stenosis. If clinical concern persists, further evaluation with CTA could be performed as indicated. Electronically Signed   By: Simonne Come M.D.   On: 11/05/2022 16:03   ECHOCARDIOGRAM LIMITED  Result Date: 11/05/2022    ECHOCARDIOGRAM LIMITED REPORT   [name] Date of Exam: 11/05/2022 Medical Rec #:  [id number]     Height:       72.0 in Accession #:    [id number]    Weight:       144.0 lb Date of Birth:  [birth year]     BSA:          1.853 m Patient Age:    67 years      BP:           126/87 mmHg Patient Gender: M             HR:           62 bpm. Exam Location:  Inpatient Procedure: Cardiac Doppler, Color Doppler and Limited Echo Indications:    Syncope R55  History:        Patient has prior history of Echocardiogram examinations, most                 recent 08/16/2022. Cardiomyopathy, CAD, COPD; Risk                 Factors:Hypertension.  Sonographer:    Harriette Bouillon RDCS Referring Phys: [id number] OLADAPO ADEFESO IMPRESSIONS  1. No change in severely reduced EF from TTE done 08/16/22 Limited echo No mural apical thrombus. Left ventricular ejection fraction, by estimation, is 25%. The left ventricle demonstrates global hypokinesis. The left ventricular internal cavity size was  severely dilated.  2. Right ventricular systolic function is mildly reduced. The right ventricular size is mildly enlarged.  3. The mitral valve is abnormal. Mild mitral valve regurgitation. FINDINGS  Left Ventricle: No change in severely reduced EF from TTE done 08/16/22 Limited echo No mural apical thrombus. Left ventricular ejection fraction, by estimation, is 25%. The left ventricle demonstrates global  hypokinesis. The left ventricular internal cavity size was severely dilated. Right Ventricle: The right ventricular size is mildly enlarged. Right ventricular systolic function is mildly reduced. Pericardium: There is no evidence of pericardial effusion. Mitral Valve: The mitral valve is abnormal. There is mild thickening of the mitral valve leaflet(s). Mild mitral valve regurgitation. Tricuspid Valve: Tricuspid valve regurgitation is mild. LEFT VENTRICLE PLAX 2D LVIDd:         6.80 cm LVIDs:         6.50 cm LV PW:         0.90 cm LV IVS:        0.90 cm  Charlton Haws MD Electronically signed by Charlton Haws MD Signature Date/Time: 11/05/2022/1:33:22 PM    Final    DG Chest Port 1 View  Result Date: 11/04/2022 CLINICAL DATA:  Syncope.  Shortness of breath. EXAM: PORTABLE CHEST 1 VIEW COMPARISON:  08/15/2022 FINDINGS: Chronic cardiomegaly. Stable mediastinal contours. Previous lower lobe opacities have resolved. Chronic interstitial coarsening. No convincing pulmonary edema. No pneumothorax. No large pleural effusion. IMPRESSION: 1. Chronic cardiomegaly. 2. Previous bilateral airspace opacities have resolved. No acute findings. Electronically Signed   By: Narda Rutherford M.D.   On: 11/04/2022 19:05    Microbiology: Results for orders placed or performed during the

## 2022-11-25 NOTE — Progress Notes (Signed)
BIPAP order is PRN. Not indicated at this time. RT will monitor as needed.

## 2022-11-25 NOTE — Progress Notes (Signed)
Patient Name: Michael [name] Date of Encounter: 11/25/2022 [facility] Cardiologist: Nona Dell, MD   Interval Summary  .    Patient denies shortness of breath and chest pain.  Feels "itchy" today.  No obvious rash present.  Vital Signs .    Vitals:   11/25/22 0500 11/25/22 0854 11/25/22 0856 11/25/22 0858  BP: (!) 149/116     Pulse: 72     Resp: 15     Temp: 98.2 F (36.8 C)     TempSrc: Oral     SpO2: 96% 97% 97% 97%  Weight: 72.3 kg     Height:        Intake/Output Summary (Last 24 hours) at 11/25/2022 1001 Last data filed at 11/25/2022 0830 Gross per 24 hour  Intake 1200 ml  Output --  Net 1200 ml      11/25/2022    5:00 AM 11/24/2022    5:00 AM 11/23/2022    8:00 AM  Last 3 Weights  Weight (lbs) 159 lb 6.3 oz 155 lb 6.8 oz 155 lb 3.3 oz  Weight (kg) 72.3 kg 70.5 kg 70.4 kg      Telemetry/ECG    Telemetry - sinus rhythm with occasional PVCs.  Limited Echo: 11/05/2022 IMPRESSIONS    1. No change in severely reduced EF from TTE done 08/16/22 Limited echo No  mural apical thrombus. Left ventricular ejection fraction, by estimation,  is 25%. The left ventricle demonstrates global hypokinesis. The left  ventricular internal cavity size was   severely dilated.   2. Right ventricular systolic function is mildly reduced. The right  ventricular size is mildly enlarged.   3. The mitral valve is abnormal. Mild mitral valve regurgitation.   Physical Exam .    GEN: Appearing in no acute distress.   Neck: No JVD. Cardiac: RRR without gallop.  Respiratory: Clear to auscultation bilaterally without wheezing or rales. GI: Soft, nontender, non-distended. MS: No pitting edema.  Assessment & Plan .     1. Syncope Unclear etiology as orthostatics non-diagnostic this admission. A Zio patch was previously in place but he lost this and was not wearing the monitor at the time of his events.  Does have occasional PVCs and brief bursts of NSVT, so  arrhythmogenic etiology is certainly possible.  He is however not a good candidate for ICD given noncompliance with regular medical therapy.  2. Acute HFrEF  - His EF was at 25% by echocardiogram earlier this month that is similar to prior imaging earlier this year. BNP elevated to greater than 4500 on admission which is similar to prior values. Medical therapy for his cardiomyopathy has been limited given medication noncompliance, orthostatic symptoms and CKD. Not on an SGLT2 inhibitor given recent UTI.  Taken off Cozaar and at present diuretics held.    3. CAD/Elevated Troponin Values Prior cath in 10/2021 showed mild to moderate obstructive CAD along the mid-RCA and acute Mrg with medical management recommended. Hs troponin values mildly elevated at 98 and 103 this admission. Not consistent with ACS. Remains on ASA, statin and beta-blocker therapy.  No active angina at this time.   4. Carotid Artery Stenosis - He is s/p right TCAR in 01/2021. Carotid Dopplers in 11/2022 showed no significant narrowing along the right internal carotid artery and moderate to large amount of left-sided atherosclerotic plaque with recommendations for CTA as an outpatient. Needs to follow-up with Vascular as an outpatient.    5. Stage 3 CKD -Creatinine has improved  toward baseline, currently 1.93.  Potassium 3.9.   6. Goals of Care - Previously DNR last admission but now Full Code. Would recommend readdressing Code Status.   Would repeat ReDS clip.  Plan to resume prior Demadex 20 mg daily with KCl 20 mEq daily.  Increase Coreg to 12.5 mg twice daily.  Hold off on resuming Cozaar.  Would have him ambulate and consider discharge for further outpatient follow-up.  States that he will bring in what he has of his ZIO monitor reporting that he wore the patch for about a week before he lost it.  Hopefully we can glean some information from this.  For questions or updates, please contact [facility] Please  consult [email or web address] for contact info under      Signed, Jonelle Sidle, M.D., F.A.C.C.

## 2022-11-25 NOTE — Progress Notes (Signed)
Discharge instructions given to patient who verbalized understanding. IV discontinued with site WNL.

## 2022-11-25 NOTE — Progress Notes (Signed)
Bipap order is PRN; Bipap not needed at this time.

## 2022-11-28 ENCOUNTER — Ambulatory Visit: Payer: Self-pay | Admitting: *Deleted

## 2022-11-28 DIAGNOSIS — D509 Iron deficiency anemia, unspecified: Secondary | ICD-10-CM | POA: Diagnosis not present

## 2022-11-28 DIAGNOSIS — N183 Chronic kidney disease, stage 3 unspecified: Secondary | ICD-10-CM | POA: Diagnosis not present

## 2022-11-28 DIAGNOSIS — J9601 Acute respiratory failure with hypoxia: Secondary | ICD-10-CM | POA: Diagnosis not present

## 2022-11-28 DIAGNOSIS — C44311 Basal cell carcinoma of skin of nose: Secondary | ICD-10-CM | POA: Diagnosis not present

## 2022-11-28 DIAGNOSIS — I5043 Acute on chronic combined systolic (congestive) and diastolic (congestive) heart failure: Secondary | ICD-10-CM | POA: Diagnosis not present

## 2022-11-28 DIAGNOSIS — R55 Syncope and collapse: Secondary | ICD-10-CM | POA: Diagnosis not present

## 2022-11-28 DIAGNOSIS — I130 Hypertensive heart and chronic kidney disease with heart failure and stage 1 through stage 4 chronic kidney disease, or unspecified chronic kidney disease: Secondary | ICD-10-CM | POA: Diagnosis not present

## 2022-11-28 DIAGNOSIS — D126 Benign neoplasm of colon, unspecified: Secondary | ICD-10-CM | POA: Diagnosis not present

## 2022-11-28 DIAGNOSIS — I6529 Occlusion and stenosis of unspecified carotid artery: Secondary | ICD-10-CM | POA: Diagnosis not present

## 2022-11-28 DIAGNOSIS — I2510 Atherosclerotic heart disease of native coronary artery without angina pectoris: Secondary | ICD-10-CM | POA: Diagnosis not present

## 2022-11-28 DIAGNOSIS — K219 Gastro-esophageal reflux disease without esophagitis: Secondary | ICD-10-CM | POA: Diagnosis not present

## 2022-11-28 DIAGNOSIS — J449 Chronic obstructive pulmonary disease, unspecified: Secondary | ICD-10-CM | POA: Diagnosis not present
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
## 2022-11-28 NOTE — Progress Notes (Deleted)
Cardiology Office Note:  .   Date:  11/28/2022  ID:  Michael [name], DOB 1955-06-03, MRN [id number] PCP: Benita Stabile, MD  [facility] Providers Cardiologist:  Nona Dell, MD { Click to update primary MD,subspecialty MD or APP then REFRESH:1}   History of Present Illness: .   Michael [name] is a 67 y.o. male with a hx of with past medical history of HFrEF (EF 30% in 10/2021, at 35-40% in 04/2022), CAD (cath in 10/2021 showing mild to moderate obstructive CAD along mid-RCA and acute Mrg with medical management recommended), carotid artery stenosis (s/p right TCAR in 01/2021), iron deficiency anemia, HTN, HLD,  COPD and Stage 3 CKD  ROS: ***  Studies Reviewed: Marland Kitchen         Prior CV Studies: {Select studies to display:26339}   Limited Echo: 11/05/2022 IMPRESSIONS    1. No change in severely reduced EF from TTE done 08/16/22 Limited echo No  mural apical thrombus. Left ventricular ejection fraction, by estimation,  is 25%. The left ventricle demonstrates global hypokinesis. The left  ventricular internal cavity size was   severely dilated.   2. Right ventricular systolic function is mildly reduced. The right  ventricular size is mildly enlarged.   3. The mitral valve is abnormal. Mild mitral valve regurgitation.     Risk Assessment/Calculations:   {Does this patient have ATRIAL FIBRILLATION?:[phone]} No BP recorded.  {Refresh Note OR Click here to enter BP  :1}***       Physical Exam:   VS:  There were no vitals taken for this visit.   Wt Readings from Last 3 Encounters:  11/25/22 159 lb 6.3 oz (72.3 kg)  11/13/22 148 lb 9.4 oz (67.4 kg)  11/05/22 144 lb (65.3 kg)    GEN: Well nourished, well developed in no acute distress NECK: No JVD; No carotid bruits CARDIAC: ***RRR, no murmurs, rubs, gallops RESPIRATORY:  Clear to auscultation without rales, wheezing or rhonchi  ABDOMEN: Soft, non-tender, non-distended EXTREMITIES:  No edema; No deformity    ASSESSMENT AND PLAN: .    Syncope Unclear etiology as orthostatics non-diagnostic this admission. A Zio patch was previously in place but he lost this and was not wearing the monitor at the time of his events.  Does have occasional PVCs and brief bursts of NSVT, so arrhythmogenic etiology is certainly possible.  He is however not a good candidate for ICD given noncompliance with regular medical therapy.     Acute HFrEF  - His EF was at 25% by echocardiogram earlier this month that is similar to prior imaging earlier this year. BNP elevated to greater than 4500 on admission which is similar to prior values. Medical therapy for his cardiomyopathy has been limited given medication noncompliance, orthostatic symptoms and CKD. Not on an SGLT2 inhibitor given recent UTI.  Taken off Cozaar        CAD/Elevated Troponin Values Prior cath in 10/2021 showed mild to moderate obstructive CAD along the mid-RCA and acute Mrg with medical management recommended. Hs troponin values mildly elevated at 98 and 103 this admission. Not consistent with ACS. Remains on ASA, statin and beta-blocker therapy.  No active angina at this time.     Carotid Artery Stenosis - He is s/p right TCAR in 01/2021. Carotid Dopplers in 11/2022 showed no significant narrowing along the right internal carotid artery and moderate to large amount of left-sided atherosclerotic plaque with recommendations for CTA as an outpatient. Needs to follow-up with Vascular as an  outpatient.      Stage 3 CKD -Creatinine has improved toward baseline, currently 1.93.  Potassium 3.9.       {Are you ordering a CV Procedure (e.g. stress test, cath, DCCV, TEE, etc)?   Press F2        :[id number]}  Dispo: ***  Signed, Jacolyn Reedy, PA-C
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
## 2022-11-28 NOTE — Consult Note (Signed)
[facility] Inpatient Consult   11/28/2022  Jalan Kina [birth year] [id number]  Primary Care Provider:  Nita Sells  Patient is currently active with Care Management for chronic disease management services.  Patient has been engaged by a  Charity fundraiser and Child psychotherapist.  Our community based plan of care has focused on disease management and community resource support.   Patient will receive a post hospital call and will be evaluated for assessments and disease process education.   Plan: Discharged home self care.  Of note, Care Management services does not replace or interfere with any services that are needed or arranged by inpatient [facility] care management team.   For additional questions or referrals please contact:  Elliot Cousin, RN, [facility] Liaison [place]   [facility], Population Health Office Hours MTWF  8:00 am-6:00 pm Direct Dial: [phone] mobile [phone] [Office toll free line] Office Hours are M-F 8:30 - 5 pm [email or web address]

## 2022-11-29 ENCOUNTER — Inpatient Hospital Stay: Payer: 59

## 2022-11-29 ENCOUNTER — Inpatient Hospital Stay: Payer: 59 | Attending: Hematology | Admitting: Physician Assistant

## 2022-11-29 VITALS — BP 147/80 | HR 54 | Temp 97.9°F | Resp 16 | Wt 155.0 lb

## 2022-11-29 DIAGNOSIS — D500 Iron deficiency anemia secondary to blood loss (chronic): Secondary | ICD-10-CM | POA: Diagnosis not present

## 2022-11-29 DIAGNOSIS — Z87891 Personal history of nicotine dependence: Secondary | ICD-10-CM | POA: Insufficient documentation

## 2022-11-29 DIAGNOSIS — E559 Vitamin D deficiency, unspecified: Secondary | ICD-10-CM

## 2022-11-29 DIAGNOSIS — D509 Iron deficiency anemia, unspecified: Secondary | ICD-10-CM | POA: Diagnosis not present

## 2022-11-29 DIAGNOSIS — Z79899 Other long term (current) drug therapy: Secondary | ICD-10-CM | POA: Diagnosis not present

## 2022-11-29 DIAGNOSIS — Z85828 Personal history of other malignant neoplasm of skin: Secondary | ICD-10-CM | POA: Insufficient documentation

## 2022-11-29 DIAGNOSIS — Z7952 Long term (current) use of systemic steroids: Secondary | ICD-10-CM | POA: Diagnosis not present

## 2022-11-29 DIAGNOSIS — Z7982 Long term (current) use of aspirin: Secondary | ICD-10-CM | POA: Diagnosis not present

## 2022-11-29 DIAGNOSIS — N1832 Chronic kidney disease, stage 3b: Secondary | ICD-10-CM | POA: Insufficient documentation

## 2022-11-29 DIAGNOSIS — D631 Anemia in chronic kidney disease: Secondary | ICD-10-CM | POA: Insufficient documentation

## 2022-11-29 LAB — CBC WITH DIFFERENTIAL/PLATELET
Abs Immature Granulocytes: 0.03 10*3/uL (ref 0.00–0.07)
Basophils Absolute: 0 10*3/uL (ref 0.0–0.1)
Basophils Relative: 0 %
Eosinophils Absolute: 0.1 10*3/uL (ref 0.0–0.5)
Eosinophils Relative: 1 %
HCT: 43.4 % (ref 39.0–52.0)
Hemoglobin: 12.8 g/dL — ABNORMAL LOW (ref 13.0–17.0)
Immature Granulocytes: 0 %
Lymphocytes Relative: 10 %
Lymphs Abs: 0.9 10*3/uL (ref 0.7–4.0)
MCH: 26 pg (ref 26.0–34.0)
MCHC: 29.5 g/dL — ABNORMAL LOW (ref 30.0–36.0)
MCV: 88.2 fL (ref 80.0–100.0)
Monocytes Absolute: 0.6 10*3/uL (ref 0.1–1.0)
Monocytes Relative: 7 %
Neutro Abs: 7.2 10*3/uL (ref 1.7–7.7)
Neutrophils Relative %: 82 %
Platelets: 225 10*3/uL (ref 150–400)
RBC: 4.92 MIL/uL (ref 4.22–5.81)
RDW: 15.4 % (ref 11.5–15.5)
WBC: 8.8 10*3/uL (ref 4.0–10.5)
nRBC: 0 % (ref 0.0–0.2)

## 2022-11-29 LAB — VITAMIN D 25 HYDROXY (VIT D DEFICIENCY, FRACTURES): Vit D, 25-Hydroxy: 54.35 ng/mL (ref 30–100)

## 2022-11-29 LAB — IRON AND TIBC
Iron: 44 ug/dL — ABNORMAL LOW (ref 45–182)
Saturation Ratios: 10 % — ABNORMAL LOW (ref 17.9–39.5)
TIBC: 448 ug/dL (ref 250–450)
UIBC: 404 ug/dL

## 2022-11-29 LAB — FERRITIN: Ferritin: 32 ng/mL (ref 24–336)

## 2022-11-29 NOTE — Progress Notes (Addendum)
[facility] [address], [place] [address]   CLINIC:  Medical Oncology/Hematology  PCP:  Benita Stabile, MD [address] [place] [address] [phone]   REASON FOR VISIT:  Follow-up for iron deficiency anemia  CURRENT THERAPY: Intermittent IV iron  INTERVAL HISTORY:   Michael [name] 67 y.o. male returns for routine follow-up of iron deficiency anemia.  He was last seen by Rojelio Brenner PA-C on 08/11/2022.  He received Venofer 300 mg x 3 from 08/24/2022 through 09/16/2022.  Since his last visit, he has been four times.   08/15/2022 through 08/18/2022: Acute on chronic combined systolic and diastolic CHF (EF 29%) and orthostatic hypotension 11/04/2022 through 11/05/2022 (left AMA): Near syncope 11/13/2022 through 11/15/2022: Syncope and UTI 11/21/2022 through 11/25/2022: Acute hypoxic respiratory failure secondary to COPD and CHF exacerbations  At today's visit, he reports feeling poorly.  He report significant fatigue.  He reports that his bowel movements are generally dark, and he denies any obvious bleeding such as bright blood per rectum or gross melena.    He has positional vertigo as well as lightheadedness and intermittent syncopal episodes (following closely with cardiology).  No pica, restless legs, headaches, chest pain. He has chronic dyspnea on exertion.    He has little to no energy and 60% appetite. He endorses that he is maintaining a stable weight.  ASSESSMENT & PLAN:  1.    Iron deficiency anemia + anemia of CKD stage IIIb - Combination anemia from CKD, malabsorption and blood loss. - Hemoccult stool was POSITIVE x3 (June 2022) - Most recent colonoscopy (07/19/2021) with villous, fungating, infiltrative and polypoid nonobstructing mass in ascending colon (addressed below); no bleeding was present - Right hemicolectomy on 08/11/2021, pathology showed high-grade dysplasia negative for invasive carcinoma -- He has never had an EGD - Patient  is hesitant to try oral iron due to abdominal side effects - He has been on intermittent IV iron infusions.  (Most recent IV Venofer 300 mg x 3 in July/August 2024) - Labs today (11/29/2022): Hgb 12.8/MCV 88.2, ferritin 32, iron saturation 10%.  Patient also has CKD stage IIIb. - Intermittent dark stool and occasional red streaks (stool cards sent home, not completed) - Persistent iron deficiency despite IV repletion is suspicious for ongoing occult blood loss, despite colon mass having been removed (right hemicolectomy on 08/11/2021) - PLAN:Recommend additional IV Venofer 300 mg x 3 - Repeat labs and RTC in 3 months. - Continue GI follow-up and consider EGD  - Patient is aware of alarm signs/symptoms that would prompt immediate medical attention   2.  Locally advanced basal cell carcinoma right nasolabial fold: - Biopsy of the right nasal mucosa on 04/05/2017 with basal cell carcinoma. - Biopsy of the left cheek lesion on 04/02/2017 consistent with basal cell carcinoma. - Resection with skin graft on 06/06/2017.  Margin was close.  Rest of the margins were negative. - 10 rounds of radiation by Dr. Basilio Cairo for recurrent basal cell carcinoma to the right medial canthus completed on 03/20/2020.  - PLAN: Continue dermatology follow-up   3.  Right colon mass (tubular adenomas with high-grade dysplasia) - PET scan in 2019 (obtained due to basal cell skin cancer) demonstrated area of concern with mass in ascending colon - Colonoscopy in 2019 by Dr. Jena Gauss demonstrated multiple polyps with tubular adenoma, and patient was referred for surgical excision of colon due to volume of polyps and concern for more extensive disease.  Patient was lost to  follow-up with GI and did not follow-up with surgeon at this time. - Reestablished care with GI in May 2023, with colonoscopy he underwent colonoscopy  (07/19/2021) showing villous, fungating, infiltrative and polypoid nonobstructing mass in ascending colon - CT  abdomen/pelvis (07/05/2021): Lobular intraluminal mass at: Of hepatic flexure measuring up to 8 cm in length; new pancreatic ductal dilation and atrophy favored to be sequela of chronic pancreatitis but MRI recommended - MRI abdomen (08/06/2021) showed upstream pancreatic atrophy and duct dilation secondary to dominant ductal calculi; no convincing evidence of underlying neoplasm - Due to large size of residual tubular adenoma/right colon mass, patient underwent right hemicolectomy on 08/11/2021.  Surgical pathology showed multiple tubular adenomas, several of which showed focal high-grade dysplasia, but were negative for invasive carcinoma.   - PLAN: Continue follow-up with GI   4.  Vitamin D deficiency - Prior vitamin D (07/31/2021) low at 18.95 - Most recent vitamin D (01/05/2022): Improved and normal at 35.06 - Patient does not think he is taking his vitamin D 50,000 units weekly anymore - Vitamin D today (11/29/2022) normal at 54.35  PLAN SUMMARY:  >> Venofer 300 mg x 3.   >> Labs in 3 months = CBC/D, CMP, ferritin, iron/TIBC >> OFFICE visit in 3 months (1 week after labs)      REVIEW OF SYSTEMS:  Review of Systems  Constitutional:  Positive for fatigue. Negative for appetite change, chills, diaphoresis, fever and unexpected weight change.  HENT:   Negative for lump/mass and nosebleeds.   Eyes:  Negative for eye problems.  Respiratory:  Positive for shortness of breath (with exertion). Negative for cough and hemoptysis.   Cardiovascular:  Negative for chest pain, leg swelling and palpitations.  Gastrointestinal:  Positive for nausea and vomiting. Negative for abdominal pain, blood in stool, constipation and diarrhea.  Genitourinary:  Negative for hematuria.   Skin: Negative.   Neurological:  Positive for dizziness and light-headedness. Negative for headaches.  Hematological:  Does not bruise/bleed easily.  Psychiatric/Behavioral:  Positive for depression and sleep disturbance.       PHYSICAL EXAM:  ECOG PERFORMANCE STATUS: 1 - Symptomatic but completely ambulatory  Vitals:   11/29/22 1021  BP: (!) 147/80  Pulse: (!) 54  Resp: 16  Temp: 97.9 F (36.6 C)  SpO2: 100%   Filed Weights   11/29/22 1021  Weight: 154 lb 15.7 oz (70.3 kg)   Physical Exam Constitutional:      Appearance: Normal appearance. He is normal weight.  Cardiovascular:     Heart sounds: Normal heart sounds.  Pulmonary:     Breath sounds: Normal breath sounds.  Neurological:     General: No focal deficit present.     Mental Status: Mental status is at baseline.  Psychiatric:        Behavior: Behavior normal. Behavior is cooperative.     PAST MEDICAL/SURGICAL HISTORY:  Past Medical History:  Diagnosis Date   Aortic atherosclerosis (HCC) 08/03/2021   CAD (coronary artery disease) 08/03/2021   Cardiac catheterization September 2023 with RCA/RV marginal stenosis managed medically   Cardiomyopathy (HCC)    Carotid artery disease (HCC)    CKD (chronic kidney disease) stage 3, GFR 30-59 ml/min (HCC)    COPD (chronic obstructive pulmonary disease) (HCC)    Essential hypertension    Head and neck cancer (HCC) 2019   Right facial basal cell carcinoma s/p resection with right partial mastectomy and partal rhinectomy with skin graft (06/13/17)   Iron deficiency anemia  Urinary retention    Past Surgical History:  Procedure Laterality Date   ANKLE CLOSED REDUCTION Right    open reduction   BASAL CELL CARCINOMA EXCISION  2019   at [facility]   BIOPSY  07/19/2021   Procedure: BIOPSY;  Surgeon: Lanelle Bal, DO;  [facility]: [facility];  Service: Endoscopy;;   COLONOSCOPY N/A 05/31/2017   Procedure: COLONOSCOPY;  Surgeon: Corbin Ade, MD;  [facility];  Service: Endoscopy;  Laterality: N/A;  2:45pm   COLONOSCOPY WITH PROPOFOL N/A 07/19/2021   Procedure: COLONOSCOPY WITH PROPOFOL;  Surgeon: Lanelle Bal, DO;  [facility];  Service: Endoscopy;  Laterality:  N/A;  3:00pm, moved up to 9:00   CYSTOSCOPY N/A 10/29/2018   Procedure: CYSTOSCOPY, CLOT EVACUATION;  Surgeon: Rene Paci, MD;  Location: [facility];  Service: Urology;  Laterality: N/A;   PARTIAL COLECTOMY Right 08/11/2021   Procedure: PARTIAL COLECTOMY, OPEN RIGHT HEMICOLECTOMY;  Surgeon: Lucretia Roers, MD;  Location: [facility];  Service: General;  Laterality: Right;   POLYPECTOMY  05/31/2017   Procedure: POLYPECTOMY;  Surgeon: Corbin Ade, MD;  [facility];  Service: Endoscopy;;   RIGHT/LEFT HEART CATH AND CORONARY ANGIOGRAPHY N/A 10/22/2021   Procedure: RIGHT/LEFT HEART CATH AND CORONARY ANGIOGRAPHY;  Surgeon: Orbie Pyo, MD;  Location: [facility];  Service: Cardiovascular;  Laterality: N/A;   TONSILLECTOMY     TRANSCAROTID ARTERY REVASCULARIZATION  Right 02/15/2021   Procedure: RIGHT TRANSCAROTID ARTERY REVASCULARIZATION;  Surgeon: Cephus Shelling, MD;  Location: [facility] OR;  Service: Vascular;  Laterality: Right;   ULTRASOUND GUIDANCE FOR VASCULAR ACCESS Left 02/15/2021   Procedure: ULTRASOUND GUIDANCE FOR VASCULAR ACCESS;  Surgeon: Cephus Shelling, MD;  Location: [facility] OR;  Service: Vascular;  Laterality: Left;   XI ROBOTIC ASSISTED SIMPLE PROSTATECTOMY N/A 10/29/2018   Procedure: XI ROBOTIC ASSISTED SIMPLE PROSTATECTOMY;  Surgeon: Malen Gauze, MD;  Location: [facility];  Service: Urology;  Laterality: N/A;    SOCIAL HISTORY:  Social History   Socioeconomic History   Marital status: Single    Spouse name: Not on file   Number of children: Not on file   Years of education: 12   Highest education level: 12th grade  Occupational History   Not on file  Tobacco Use   Smoking status: Former    Current packs/day: 0.00    Average packs/day: 0.5 packs/day for 40.0 years (20.0 ttl pk-yrs)    Types: Cigarettes    Start date: 07/02/1981    Quit date: 07/02/2021    Years since quitting: 1.4    Passive exposure: Past   Smokeless tobacco: Never    Tobacco comments:    Quit on 06/09/21  Vaping Use   Vaping status: Never Used  Substance and Sexual Activity   Alcohol use: Not Currently   Drug use: Never   Sexual activity: Not Currently  Other Topics Concern   Not on file  Social History Narrative   Not on file   Social Determinants of Health   Financial Resource Strain: Low Risk  (08/19/2022)   Overall Financial Resource Strain (CARDIA)    Difficulty of Paying Living Expenses: Not hard at all  Food Insecurity: No Food Insecurity (11/22/2022)   Hunger Vital Sign    Worried About Running Out of Food in the Last Year: Never true    Ran Out of Food in the Last Year: Never true  Transportation Needs: No Transportation Needs (11/22/2022)  PRAPARE - Administrator, Civil Service (Medical): No    Lack of Transportation (Non-Medical): No  Physical Activity: Inactive (08/19/2022)   Exercise Vital Sign    Days of Exercise per Week: 0 days    Minutes of Exercise per Session: 0 min  Stress: No Stress Concern Present (08/19/2022)   [employer] of Occupational Health - Occupational Stress Questionnaire    Feeling of Stress : Not at all  Social Connections: Unknown (08/19/2022)   Social Connection and Isolation Panel [NHANES]    Frequency of Communication with Friends and Family: More than three times a week    Frequency of Social Gatherings with Friends and Family: More than three times a week    Attends Religious Services: More than 4 times per year    Active Member of [employer] or Organizations: Yes    Attends Banker Meetings: More than 4 times per year    Marital Status: Not on file  Intimate Partner Violence: Not At Risk (11/22/2022)   Humiliation, Afraid, Rape, and Kick questionnaire    Fear of Current or Ex-Partner: No    Emotionally Abused: No    Physically Abused: No    Sexually Abused: No    FAMILY HISTORY:  Family History  Problem Relation Age of Onset   Stroke Father    Cirrhosis Mother     Colon cancer Neg Hx     CURRENT MEDICATIONS:  Outpatient Encounter Medications as of 11/29/2022  Medication Sig   albuterol (VENTOLIN HFA) 108 (90 Base) MCG/ACT inhaler Inhale 2 puffs into the lungs every 6 (six) hours as needed for wheezing or shortness of breath.   aspirin EC 81 MG tablet Take 1 tablet (81 mg total) by mouth daily. Swallow whole.   Budeson-Glycopyrrol-Formoterol (BREZTRI AEROSPHERE) 160-9-4.8 MCG/ACT AERO Inhale 2 puffs into the lungs 2 (two) times daily.   carvedilol (COREG) 12.5 MG tablet Take 1 tablet (12.5 mg total) by mouth 2 (two) times daily with a meal.   dextromethorphan-guaiFENesin (MUCINEX DM) 30-600 MG 12hr tablet Take 1 tablet by mouth 2 (two) times daily for 8 days.   feeding supplement (ENSURE ENLIVE / ENSURE PLUS) LIQD Take 237 mLs by mouth 3 (three) times daily between meals.   Multiple Vitamin (MULTIVITAMIN WITH MINERALS) TABS tablet Take 1 tablet by mouth daily.   pantoprazole (PROTONIX) 40 MG tablet Take 1 tablet (40 mg total) by mouth daily.   predniSONE (DELTASONE) 20 MG tablet Take 3 tabs daily x 1 day; then 2 tablets by mouth daily x 2 days; then 1 tablet by mouth daily x 3 days; then half tablet by mouth daily x 3 days and stop prednisone.   REPATHA SURECLICK 140 MG/ML SOAJ Inject 1 mL into the skin every 14 (fourteen) days.   torsemide (DEMADEX) 20 MG tablet Take 1 tablet (20 mg total) by mouth daily.   atorvastatin (LIPITOR) 40 MG tablet Take 1 tablet (40 mg total) by mouth every evening. (Patient taking differently: Take 40 mg by mouth 2 (two) times daily.)   potassium chloride SA (KLOR-CON M) 20 MEQ tablet Take 1 tablet (20 mEq total) by mouth daily.   No facility-administered encounter medications on file as of 11/29/2022.    ALLERGIES:  No Known Allergies  LABORATORY DATA:  I have reviewed the labs as listed.  CBC    Component Value Date/Time   WBC 8.8 11/29/2022 0903   RBC 4.92 11/29/2022 0903   HGB 12.8 (L) 11/29/2022 9147  HGB  13.8 04/23/2011 0919   HCT 43.4 11/29/2022 0903   HCT 26.0 (L) 05/09/2018 0455   PLT 225 11/29/2022 0903   PLT 208 04/23/2011 0919   MCV 88.2 11/29/2022 0903   MCV 84 04/23/2011 0919   MCH 26.0 11/29/2022 0903   MCHC 29.5 (L) 11/29/2022 0903   RDW 15.4 11/29/2022 0903   RDW 12.3 04/23/2011 0919   LYMPHSABS 0.9 11/29/2022 0903   LYMPHSABS 1.1 04/23/2011 0919   MONOABS 0.6 11/29/2022 0903   MONOABS 0.9 (H) 04/23/2011 0919   EOSABS 0.1 11/29/2022 0903   EOSABS 0.0 04/23/2011 0919   BASOSABS 0.0 11/29/2022 0903   BASOSABS 0.0 04/23/2011 0919      Latest Ref Rng & Units 11/25/2022    4:20 AM 11/24/2022    4:21 AM 11/23/2022   12:49 AM  CMP  Glucose 70 - 99 mg/dL 161  096  045   BUN 8 - 23 mg/dL 50  47  39   Creatinine 0.61 - 1.24 mg/dL 4.09  8.11  9.14   Sodium 135 - 145 mmol/L 136  134  137   Potassium 3.5 - 5.1 mmol/L 3.9  3.6  4.2   Chloride 98 - 111 mmol/L 104  102  104   CO2 22 - 32 mmol/L 24  23  23    Calcium 8.9 - 10.3 mg/dL 8.3  8.0  8.2     DIAGNOSTIC IMAGING:  I have independently reviewed the relevant imaging and discussed with the patient.   WRAP UP:  All questions were answered. The patient knows to call the clinic with any problems, questions or concerns.  Medical decision making: Moderate  Time spent on visit: I spent 20 minutes counseling the patient face to face. The total time spent in the appointment was 30 minutes and more than 50% was on counseling.  Carnella Guadalajara, PA-C  11/29/22 11/29/22 10:56 AM

## 2022-11-29 NOTE — Addendum Note (Signed)
Addended by: Rojelio Brenner on: 11/29/2022 01:22 PM   Modules accepted: Orders

## 2022-12-01 ENCOUNTER — Ambulatory Visit: Payer: Self-pay | Admitting: *Deleted

## 2022-12-01 DIAGNOSIS — Z6827 Body mass index (BMI) 27.0-27.9, adult: Secondary | ICD-10-CM | POA: Insufficient documentation

## 2022-12-01 DIAGNOSIS — Z9079 Acquired absence of other genital organ(s): Secondary | ICD-10-CM | POA: Insufficient documentation

## 2022-12-02 ENCOUNTER — Encounter: Payer: Self-pay | Admitting: *Deleted

## 2022-12-02 NOTE — Patient Instructions (Addendum)
Visit Information  Thank you for taking time to visit with me today. Please don't hesitate to contact me if I can be of assistance to you.   Following are the goals we discussed today:   Goals Addressed             This Visit's Progress    COMPLETED: Care Coordination Services       Duplicate goal      Manage CHF, COPD, hypertenson at home  -nurse care coordination services   Not on track    Care Coordination Goals:  Patient will continue Home Health services with [facility] Patient/family will work with palliative care. Order placed while inpatient.  Patient will work with [facility]/ Care Management Dept regarding LTC placement once discharged Patient will continue to work with care coordination SW as needed  Interventions Today    Flowsheet Row Most Recent Value  Chronic Disease   Chronic disease during today's visit Chronic Obstructive Pulmonary Disease (COPD), Congestive Heart Failure (CHF), Hypertension (HTN), Other  [increase sleeping, compliance/remembering to take medications decrease appetite]  General Interventions   General Interventions Discussed/Reviewed General Interventions Reviewed, Labs, Durable Medical Equipment (DME), Sick Day Rules, [facility], Doctor Visits, Communication with  Labs Hgb A1c every 3 months  Doctor Visits Discussed/Reviewed Doctor Visits Reviewed, PCP, Database administrator (DME) Other, Sports coach for CHF]  Wheelchair Standard  PCP/Specialist Visits Compliance with follow-up visit  Communication with Pharmacists  [employer] message for the pill packaging staff at [employer] to return a call to patient and or RN CM to assist will packaging service to help with compliance/decrease duplication]  Exercise Interventions   Exercise Discussed/Reviewed Exercise Reviewed, Physical Activity, Weight Managment  Physical Activity Discussed/Reviewed Physical Activity Reviewed, Types of exercise  Weight Management Weight  maintenance  Education Interventions   Education Provided Provided Web-based Education, Provided Education  Provided Verbal Education On --  [mailed education on Heart failure anemia COPD]  Labs Reviewed Hgb A1c  Mental Health Interventions   Mental Health Discussed/Reviewed Mental Health Reviewed, Coping Strategies  Nutrition Interventions   Nutrition Discussed/Reviewed Nutrition Reviewed, Fluid intake, Increasing proteins, Supplemental nutrition  Pharmacy Interventions   Pharmacy Dicussed/Reviewed Pharmacy Topics Reviewed, Medications and their functions, Affording Medications, Medication Adherence  Medication Adherence Not taking medication  [medication compliance/remembering to take them & not duplicating them]  Safety Interventions   Safety Discussed/Reviewed Safety Reviewed, Fall Risk, Home Safety  Home Safety Assistive Devices  Advanced Directive Interventions   Advanced Directives Discussed/Reviewed Advanced Directives Discussed, Advanced Care Planning  [has documents /palliative care]               Our next appointment is by telephone on 12/22/22 at 3 pm  Please call the care guide team at [phone] if you need to cancel or reschedule your appointment.   If you are experiencing a Mental Health or Behavioral Health Crisis or need someone to talk to, please call the Suicide and Crisis Lifeline: 988 call the [place] National Suicide Prevention Lifeline: 902-545-5331 or TTY: 650-702-6926 TTY 562-131-3607) to talk to a trained counselor call 1-800-273-TALK (toll free, 24 hour hotline) call the [facility]: (830)546-0510 call 911   No computer access, no preference for copy of AVS    The patient has been provided with contact information for the care management team and has been advised to call with any health related questions or concerns.   Aleane Wesenberg L. Noelle Penner, RN, BSN, CCM  [facility] Care Management Coordinator  [phone]  Fax: [phone]

## 2022-12-05 DIAGNOSIS — H2521 Age-related cataract, morgagnian type, right eye: Secondary | ICD-10-CM | POA: Diagnosis not present

## 2022-12-06 ENCOUNTER — Encounter (HOSPITAL_COMMUNITY)
Admission: RE | Admit: 2022-12-06 | Discharge: 2022-12-06 | Disposition: A | Discharge status: Home / Self Care | Payer: 59 | Source: Ambulatory Visit | Attending: Ophthalmology | Admitting: Ophthalmology

## 2022-12-06 NOTE — Pre-Procedure Instructions (Signed)
Attempted pre-op phone call. Has no VM set up.

## 2022-12-07 ENCOUNTER — Inpatient Hospital Stay: Payer: 59 | Attending: Hematology

## 2022-12-07 ENCOUNTER — Encounter (HOSPITAL_COMMUNITY): Payer: Self-pay

## 2022-12-07 VITALS — BP 135/73 | HR 61 | Temp 98.0°F | Resp 20

## 2022-12-07 DIAGNOSIS — N1832 Chronic kidney disease, stage 3b: Secondary | ICD-10-CM | POA: Insufficient documentation

## 2022-12-07 DIAGNOSIS — D500 Iron deficiency anemia secondary to blood loss (chronic): Secondary | ICD-10-CM

## 2022-12-07 DIAGNOSIS — D631 Anemia in chronic kidney disease: Secondary | ICD-10-CM | POA: Insufficient documentation

## 2022-12-07 DIAGNOSIS — D509 Iron deficiency anemia, unspecified: Secondary | ICD-10-CM | POA: Diagnosis not present

## 2022-12-07 MED ORDER — CETIRIZINE HCL 10 MG PO TABS
10.0000 mg | ORAL_TABLET | Freq: Once | ORAL | Status: AC
  Administered 2022-12-07: 10 mg via ORAL
  Filled 2022-12-07: qty 1

## 2022-12-07 MED ORDER — SODIUM CHLORIDE 0.9 % IV SOLN
300.0000 mg | Freq: Once | INTRAVENOUS | Status: AC
  Administered 2022-12-07: 300 mg via INTRAVENOUS
  Filled 2022-12-07: qty 200

## 2022-12-07 MED ORDER — CARVEDILOL 3.125 MG PO TABS
12.5000 mg | ORAL_TABLET | Freq: Once | ORAL | Status: AC
  Administered 2022-12-07: 12.5 mg via ORAL
  Filled 2022-12-07: qty 4

## 2022-12-07 MED ORDER — SODIUM CHLORIDE 0.9 % IV SOLN: INTRAVENOUS | Status: DC

## 2022-12-07 MED ORDER — SODIUM CHLORIDE 0.9% FLUSH: 10.0000 mL | Freq: Two times a day (BID) | INTRAVENOUS | Status: DC

## 2022-12-07 MED ORDER — ACETAMINOPHEN 325 MG PO TABS
650.0000 mg | ORAL_TABLET | Freq: Once | ORAL | Status: AC
Start: 2022-12-07 — End: 2022-12-07
  Administered 2022-12-07: 650 mg via ORAL
  Filled 2022-12-07: qty 2

## 2022-12-07 NOTE — Progress Notes (Signed)
0900 blood pressure was elevated this am. Notified Rebekah Pennington PA-C. Will give home dose of Coreg- his home BP medication per [employer] PA-C.   Venofer iron infusion given per orders. Patient tolerated it well without problems. Vitals stable and discharged home from clinic ambulatory. Follow up as scheduled.

## 2022-12-07 NOTE — Patient Instructions (Signed)
[facility] - A DEPT OF [facility]  Discharge Instructions: Thank you for choosing [facility] to provide your oncology and hematology care.  If you have a lab appointment with the Cancer Center - please note that after April 8th, 2024, all labs will be drawn in the cancer center.  You do not have to check in or register with the main entrance as you have in the past but will complete your check-in in the cancer center.  Wear comfortable clothing and clothing appropriate for easy access to any Portacath or PICC line.   We strive to give you quality time with your provider. You may need to reschedule your appointment if you arrive late (15 or more minutes).  Arriving late affects you and other patients whose appointments are after yours.  Also, if you miss three or more appointments without notifying the office, you may be dismissed from the clinic at the provider's discretion.      For prescription refill requests, have your pharmacy contact our office and allow 72 hours for refills to be completed.    Today you received the following iron infusion-Venofer    To help prevent nausea and vomiting after your treatment, we encourage you to take your nausea medication as directed.  BELOW ARE SYMPTOMS THAT SHOULD BE REPORTED IMMEDIATELY: *FEVER GREATER THAN 100.4 F (38 C) OR HIGHER *CHILLS OR SWEATING *NAUSEA AND VOMITING THAT IS NOT CONTROLLED WITH YOUR NAUSEA MEDICATION *UNUSUAL SHORTNESS OF BREATH *UNUSUAL BRUISING OR BLEEDING *URINARY PROBLEMS (pain or burning when urinating, or frequent urination) *BOWEL PROBLEMS (unusual diarrhea, constipation, pain near the anus) TENDERNESS IN MOUTH AND THROAT WITH OR WITHOUT PRESENCE OF ULCERS (sore throat, sores in mouth, or a toothache) UNUSUAL RASH, SWELLING OR PAIN  UNUSUAL VAGINAL DISCHARGE OR ITCHING   Items with * indicate a potential emergency and should be followed up as soon as possible or go to  the Emergency Department if any problems should occur.  Please show the CHEMOTHERAPY ALERT CARD or IMMUNOTHERAPY ALERT CARD at check-in to the Emergency Department and triage nurse.  Should you have questions after your visit or need to cancel or reschedule your appointment, please contact [facility] - A DEPT OF [facility] [phone]  and follow the prompts.  Office hours are 8:00 a.m. to 4:30 p.m. Monday - Friday. Please note that voicemails left after 4:00 p.m. may not be returned until the following business day.  We are closed weekends and major holidays. You have access to a nurse at all times for urgent questions. Please call the main number to the clinic [phone] and follow the prompts.  For any non-urgent questions, you may also contact your provider using MyChart. We now offer e-Visits for anyone 19 and older to request care online for non-urgent symptoms. For details visit [email or web address].   Also download the MyChart app! Go to the app store, search "MyChart", open the app, select [facility], and log in with your MyChart username and password.

## 2022-12-08 DIAGNOSIS — I5022 Chronic systolic (congestive) heart failure: Secondary | ICD-10-CM | POA: Diagnosis not present

## 2022-12-08 DIAGNOSIS — N1832 Chronic kidney disease, stage 3b: Secondary | ICD-10-CM | POA: Diagnosis not present

## 2022-12-08 DIAGNOSIS — E1122 Type 2 diabetes mellitus with diabetic chronic kidney disease: Secondary | ICD-10-CM | POA: Diagnosis not present

## 2022-12-08 DIAGNOSIS — I2510 Atherosclerotic heart disease of native coronary artery without angina pectoris: Secondary | ICD-10-CM | POA: Diagnosis not present

## 2022-12-09 ENCOUNTER — Ambulatory Visit: Payer: Self-pay | Admitting: *Deleted

## 2022-12-09 NOTE — Patient Outreach (Signed)
  Care Coordination   Follow Up Visit Note   12/09/2022 Name: Aidan Lineweaver MRN: [id number] DOB: [birth year]  Zykee Loya is a 67 y.o. year old male who sees [name], [name], MD for primary care. I spoke with [name], friend of Antar Dollman by phone today.  What matters to the patients health and wellness today?  Pill packing     Goals Addressed             This Visit's Progress    Manage CHF, COPD, hypertenson at home  -nurse care coordination services       Care Coordination Goals:  Patient will continue Home Health services with [facility] Patient/family will work with palliative care. Order placed while inpatient.  Patient will work with [facility]/ Care Management Dept regarding LTC placement once discharged Patient will continue to work with care coordination SW as needed Patient will begin to receive medicines via pill packaging from [facility]  Interventions Today    Flowsheet Row Most Recent Value  Chronic Disease   Chronic disease during today's visit Other  [pill packaging /[facility] ([name])]  General Interventions   General Interventions Discussed/Reviewed General Interventions Reviewed, Communication with, Risk analyst with Pharmacists  Nicholes Stairs to [place] at [facility] after she left a voice message. ? this is correct pt. she did not leave a name.  Left message. spoke with other pharmacy staff. [name] returns Monday Spoke with Rene Kocher Biggsrequest taking 90 day supply meds to [name]]               SDOH assessments and interventions completed:  No     Care Coordination Interventions:  Yes, provided   Follow up plan: Follow up call scheduled for 12/22/22 correction from 12/02/22 note    Encounter Outcome:  Patient Visit Completed

## 2022-12-09 NOTE — H&P (Signed)
Surgical History & Physical  Patient Name: Michael [name]  DOB: 06-Apr-1955  Surgery: Cataract extraction with intraocular lens implant phacoemulsification; Right Eye Surgeon: [name] MD Surgery Date: 12/12/2022 Pre-Op Date: 11/10/2022  HPI: A 55 Yr. old male patient 1. The patient is returning for a cataract evaluation. Both eyes are affected. Since the last visit, the affected area is tolerating. The patient's vision is blurry. The condition's severity is constant. The complaint is associated with difficulty reading small print on medicine bottles/labels, difficulty seeing at night, and difficulty seeing steps and stairs. This is negatively affecting the patient's quality of life and the patient is unable to function adequately in life with the current level of vision. HPI was performed by [name] .  Medical History: Cataracts Lazy Eye  Cancer High Blood Pressure Lung Problems  Review of Systems Negative Allergic/Immunologic Hypertension Cardiovascular Negative Constitutional Negative Ear, Nose, Mouth & Throat Negative Endocrine Cataracts Eyes Negative Gastrointestinal Negative Genitourinary Negative Hemotologic/Lymphatic Negative Integumentary Negative Musculoskeletal Negative Neurological Negative Psychiatry Lung problems Respiratory  Social Current every day smoker  Medication atorvastatin ,  torsemide ,  potassium chloride ,  losartan ,  carvedilol ,  Repatha SureClick ,  Breztri Aerosphere   Sx/Procedures [employer] Sx,  Carotid Artery R, Nasal skin cancer removal/skin graft  Drug Allergies  NKDA  History & Physical: Heent: cataracts NECK: supple without bruits LUNGS: lungs clear to auscultation CV: regular rate and rhythm Abdomen: soft and non-tender  Impression & Plan: Assessment: 1.  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  EXPOSURE KERATOCONJUNCTIVITIS; Right Eye (H16.211) 3.  BLEPHARITIS; Right Upper Lid,  Right Lower Lid, Left Upper Lid, Left Lower Lid (H01.001, H01.002,H01.004,H01.005) 4.  COMBINED FORMS AGE RELATED CATARACT; Left Eye (H25.812)  Plan: 1.  Cataract accounts for the patient's decreased vision. This visual impairment is not correctable with a tolerable change in glasses or contact lenses. Cataract surgery with an implantation of a new lens should significantly improve the visual and functional status of the patient. Discussed all risks, benefits, alternatives, and potential complications. Discussed the procedures and recovery. Patient desires to have surgery. A-scan ordered and performed today for intra-ocular lens calculations. The surgery will be performed in order to improve vision for driving, reading, and for eye examinations. Recommend phacoemulsification with intra-ocular lens. Recommend Dextenza for post-operative pain and inflammation. Right Eye. Dilates well - shugarcaine by protocol. Vision [employer].  2.  Significant. Due to incomplete blink. S/p large skin transplant/reconstruction right face for skin cancer. Start PF tears 1 drop at least 4x/day. Consider Celluvisc PF OD. Call.  3.  Blepharitis is present - recommend regular lid cleaning.  4.  will address after right eye cataract surgery

## 2022-12-09 NOTE — Patient Instructions (Signed)
Visit Information  Thank you for taking time to visit with me today. Please don't hesitate to contact me if I can be of assistance to you.   Following are the goals we discussed today:   Goals Addressed             This Visit's Progress    Manage CHF, COPD, hypertenson at home  -nurse care coordination services       Care Coordination Goals:  Patient will continue Home Health services with [facility] Patient/family will work with palliative care. Order placed while inpatient.  Patient will work with [facility]/ Care Management Dept regarding LTC placement once discharged Patient will continue to work with care coordination SW as needed Patient will begin to receive medicines via pill packaging from [facility]  Interventions Today    Flowsheet Row Most Recent Value  Chronic Disease   Chronic disease during today's visit Other  [pill packaging /[facility] ([name])]  General Interventions   General Interventions Discussed/Reviewed General Interventions Reviewed, Communication with, Risk analyst with Pharmacists  Nicholes Stairs to [place] at [facility] after she left a voice message. ? this is correct pt. she did not leave a name.  Left message. spoke with other pharmacy staff. [name] returns Monday Spoke with Rene Kocher Biggsrequest taking 90 day supply meds to [name]]               Our next appointment is by telephone on 12/22/22 at 3 pm  Please call the care guide team at [phone] if you need to cancel or reschedule your appointment.   If you are experiencing a Mental Health or Behavioral Health Crisis or need someone to talk to, please call the Suicide and Crisis Lifeline: 988 call the [place] National Suicide Prevention Lifeline: 380-458-0529 or TTY: (908)877-7596 TTY 401 685 2904) to talk to a trained counselor call 1-800-273-TALK (toll free, 24 hour hotline) call the [facility]: [phone] call 911   No computer  access, no preference for copy of AVS      The patient has been provided with contact information for the care management team and has been advised to call with any health related questions or concerns.   Esaiah Wanless L. Noelle Penner, RN, BSN, [facility]  VBCI Care Management Coordinator  [phone]  Fax: [phone]

## 2022-12-12 ENCOUNTER — Ambulatory Visit: Payer: Self-pay | Admitting: *Deleted

## 2022-12-12 ENCOUNTER — Encounter (HOSPITAL_COMMUNITY)
Admission: RE | Disposition: A | Discharge status: Home / Self Care | Payer: Self-pay | Source: Home / Self Care | Attending: Ophthalmology

## 2022-12-12 ENCOUNTER — Ambulatory Visit (HOSPITAL_COMMUNITY)
Admission: RE | Admit: 2022-12-12 | Discharge: 2022-12-12 | Disposition: A | Discharge status: Home / Self Care | Payer: 59 | Attending: Ophthalmology | Admitting: Ophthalmology

## 2022-12-12 ENCOUNTER — Ambulatory Visit (HOSPITAL_COMMUNITY): Payer: 59 | Admitting: Certified Registered"

## 2022-12-12 ENCOUNTER — Ambulatory Visit: Payer: 59 | Attending: Physician Assistant | Admitting: Physician Assistant

## 2022-12-12 ENCOUNTER — Encounter: Payer: Self-pay | Admitting: Physician Assistant

## 2022-12-12 DIAGNOSIS — Z85828 Personal history of other malignant neoplasm of skin: Secondary | ICD-10-CM | POA: Diagnosis not present

## 2022-12-12 DIAGNOSIS — I2510 Atherosclerotic heart disease of native coronary artery without angina pectoris: Secondary | ICD-10-CM | POA: Insufficient documentation

## 2022-12-12 DIAGNOSIS — H0100B Unspecified blepharitis left eye, upper and lower eyelids: Secondary | ICD-10-CM | POA: Insufficient documentation

## 2022-12-12 DIAGNOSIS — H2521 Age-related cataract, morgagnian type, right eye: Secondary | ICD-10-CM

## 2022-12-12 DIAGNOSIS — H25812 Combined forms of age-related cataract, left eye: Secondary | ICD-10-CM | POA: Diagnosis not present

## 2022-12-12 DIAGNOSIS — I509 Heart failure, unspecified: Secondary | ICD-10-CM | POA: Diagnosis not present

## 2022-12-12 DIAGNOSIS — N189 Chronic kidney disease, unspecified: Secondary | ICD-10-CM | POA: Insufficient documentation

## 2022-12-12 DIAGNOSIS — Z87891 Personal history of nicotine dependence: Secondary | ICD-10-CM | POA: Insufficient documentation

## 2022-12-12 DIAGNOSIS — Z79899 Other long term (current) drug therapy: Secondary | ICD-10-CM | POA: Insufficient documentation

## 2022-12-12 DIAGNOSIS — H16211 Exposure keratoconjunctivitis, right eye: Secondary | ICD-10-CM | POA: Insufficient documentation

## 2022-12-12 DIAGNOSIS — H0100A Unspecified blepharitis right eye, upper and lower eyelids: Secondary | ICD-10-CM | POA: Insufficient documentation

## 2022-12-12 DIAGNOSIS — I130 Hypertensive heart and chronic kidney disease with heart failure and stage 1 through stage 4 chronic kidney disease, or unspecified chronic kidney disease: Secondary | ICD-10-CM | POA: Diagnosis not present

## 2022-12-12 DIAGNOSIS — I739 Peripheral vascular disease, unspecified: Secondary | ICD-10-CM | POA: Diagnosis not present

## 2022-12-12 DIAGNOSIS — I252 Old myocardial infarction: Secondary | ICD-10-CM | POA: Diagnosis not present

## 2022-12-12 DIAGNOSIS — J449 Chronic obstructive pulmonary disease, unspecified: Secondary | ICD-10-CM | POA: Insufficient documentation

## 2022-12-12 SURGERY — CATARACT EXTRACTION PHACO AND INTRAOCULAR LENS PLACEMENT (IOC) with placement of Corticosteroid
Anesthesia: Monitor Anesthesia Care | Site: Eye | Laterality: Right

## 2022-12-12 MED ORDER — TRYPAN BLUE 0.06 % IO SOSY
PREFILLED_SYRINGE | INTRAOCULAR | Status: DC | PRN
  Administered 2022-12-12: .5 mL via INTRAOCULAR

## 2022-12-12 MED ORDER — TROPICAMIDE 1 % OP SOLN
1.0000 [drp] | OPHTHALMIC | Status: AC | PRN
Start: 2022-12-12 — End: 2022-12-12
  Administered 2022-12-12 (×3): 1 [drp] via OPHTHALMIC

## 2022-12-12 MED ORDER — SODIUM HYALURONATE 10 MG/ML IO SOLUTION
PREFILLED_SYRINGE | INTRAOCULAR | Status: DC | PRN
  Administered 2022-12-12: .85 mL via INTRAOCULAR

## 2022-12-12 MED ORDER — MIDAZOLAM HCL 2 MG/2ML IJ SOLN
INTRAMUSCULAR | Status: AC
Start: 2022-12-12 — End: ?
  Filled 2022-12-12: qty 2

## 2022-12-12 MED ORDER — TETRACAINE HCL 0.5 % OP SOLN
1.0000 [drp] | OPHTHALMIC | Status: AC | PRN
Start: 2022-12-12 — End: 2022-12-12
  Administered 2022-12-12 (×3): 1 [drp] via OPHTHALMIC

## 2022-12-12 MED ORDER — MOXIFLOXACIN HCL 5 MG/ML IO SOLN
INTRAOCULAR | Status: DC | PRN
  Administered 2022-12-12: .3 mL via INTRACAMERAL

## 2022-12-12 MED ORDER — LIDOCAINE HCL 3.5 % OP GEL
1.0000 | Freq: Once | OPHTHALMIC | Status: AC
  Administered 2022-12-12: 1 via OPHTHALMIC

## 2022-12-12 MED ORDER — BSS IO SOLN
INTRAOCULAR | Status: DC | PRN
  Administered 2022-12-12: 15 mL via INTRAOCULAR

## 2022-12-12 MED ORDER — TRYPAN BLUE 0.06 % IO SOSY
PREFILLED_SYRINGE | INTRAOCULAR | Status: AC
  Filled 2022-12-12: qty 0.5

## 2022-12-12 MED ORDER — FENTANYL CITRATE (PF) 100 MCG/2ML IJ SOLN
INTRAMUSCULAR | Status: AC
  Filled 2022-12-12: qty 2

## 2022-12-12 MED ORDER — LIDOCAINE HCL (PF) 1 % IJ SOLN
INTRAOCULAR | Status: DC | PRN
  Administered 2022-12-12: 1 mL via OPHTHALMIC

## 2022-12-12 MED ORDER — SODIUM HYALURONATE 23MG/ML IO SOSY
PREFILLED_SYRINGE | INTRAOCULAR | Status: DC | PRN
  Administered 2022-12-12: .6 mL via INTRAOCULAR

## 2022-12-12 MED ORDER — POVIDONE-IODINE 5 % OP SOLN
OPHTHALMIC | Status: DC | PRN
  Administered 2022-12-12: 1 via OPHTHALMIC

## 2022-12-12 MED ORDER — PHENYLEPHRINE HCL 2.5 % OP SOLN
1.0000 [drp] | OPHTHALMIC | Status: AC | PRN
Start: 2022-12-12 — End: 2022-12-12
  Administered 2022-12-12 (×3): 1 [drp] via OPHTHALMIC

## 2022-12-12 MED ORDER — EPINEPHRINE PF 1 MG/ML IJ SOLN
INTRAOCULAR | Status: DC | PRN
  Administered 2022-12-12: 500 mL

## 2022-12-12 MED ORDER — STERILE WATER FOR IRRIGATION IR SOLN
Status: DC | PRN
  Administered 2022-12-12: 1

## 2022-12-12 SURGICAL SUPPLY — 13 items
CATARACT SUITE SIGHTPATH (MISCELLANEOUS) ×1 IMPLANT
CLOTH BEACON ORANGE TIMEOUT ST (SAFETY) ×1 IMPLANT
EYE SHIELD UNIVERSAL CLEAR (GAUZE/BANDAGES/DRESSINGS) IMPLANT
FEE CATARACT SUITE SIGHTPATH (MISCELLANEOUS) ×1 IMPLANT
GLOVE BIOGEL PI IND STRL 7.0 (GLOVE) ×2 IMPLANT
LENS IOL TECNIS EYHANCE 20.5 (Intraocular Lens) IMPLANT
NDL HYPO 18GX1.5 BLUNT FILL (NEEDLE) ×1 IMPLANT
NEEDLE HYPO 18GX1.5 BLUNT FILL (NEEDLE) ×1 IMPLANT
PAD ARMBOARD 7.5X6 YLW CONV (MISCELLANEOUS) ×1 IMPLANT
POSITIONER HEAD 8X9X4 ADT (SOFTGOODS) ×1 IMPLANT
SYR TB 1ML LL NO SAFETY (SYRINGE) ×1 IMPLANT
TAPE SURG TRANSPORE 1 IN (GAUZE/BANDAGES/DRESSINGS) IMPLANT
WATER STERILE IRR 250ML POUR (IV SOLUTION) ×1 IMPLANT

## 2022-12-12 NOTE — Discharge Instructions (Signed)
Please discharge patient when stable, will follow up today with Dr. Carnella Fryman at the [facility] office immediately following discharge.  Leave shield in place until visit.  All paperwork with discharge instructions will be given at the office.  [facility] Address:  [address]  [place], [place] [address]  

## 2022-12-12 NOTE — Transfer of Care (Addendum)
Immediate Anesthesia Transfer of Care Note  Patient: Michael [name]  Procedure(s) Performed: CATARACT EXTRACTION PHACO AND INTRAOCULAR LENS PLACEMENT (IOC) (Right: Eye)  Patient Location: Short Stay  Anesthesia Type:MAC  Level of Consciousness: awake and patient cooperative  Airway & Oxygen Therapy: Patient Spontanous Breathing  Post-op Assessment: Report given to RN and Post -op Vital signs reviewed and stable  Post vital signs: Reviewed and stable  Last Vitals:  Vitals Value Taken Time  BP 163/123 12/12/22   0910  Temp 36.7 12/12/22   0906  Pulse 72 12/12/22   0906  Resp 17 12/12/22   0906  SpO2 96% 12/12/22   0906    Last Pain:  Vitals:   12/12/22 0747  PainSc: 0-No pain         Complications: No notable events documented.

## 2022-12-12 NOTE — Patient Instructions (Signed)
Visit Information  Thank you for taking time to visit with me today. Please don't hesitate to contact me if I can be of assistance to you.   Following are the goals we discussed today:   Goals Addressed               This Visit's Progress     Receive Assistance Completing Medicaid Application & Pursuing Higher Level of Care Options. (pt-stated)   On track     Care Coordination Interventions:  Interventions Today    Flowsheet Row Most Recent Value  Chronic Disease   Chronic disease during today's visit Diabetes, Chronic Obstructive Pulmonary Disease (COPD), Hypertension (HTN), Congestive Heart Failure (CHF), Chronic Kidney Disease/End Stage Renal Disease (ESRD), Other  General Interventions   General Interventions Discussed/Reviewed General Interventions Discussed, Doctor Visits, General Interventions Reviewed, Durable Medical Equipment (DME), Annual Eye Exam, Level of Care, [facility], Communication with, Health Screening, Vaccines  [Communication with Care Team Members]  Vaccines COVID-19, Flu, Pneumonia, RSV, Shingles, Tetanus/Pertussis/Diphtheria  [Encouraged]  Doctor Visits Discussed/Reviewed Doctor Visits Discussed, Specialist, Doctor Visits Reviewed, Annual Wellness Visits, PCP  [Encouraged]  Health Screening Bone Density, Colonoscopy, Prostate  [Encouraged]  Durable Medical Equipment (DME) Bed side commode, BP Cuff, Glucomoter, Environmental consultant, Wheelchair, Other  [Scales, Cane]  Wheelchair Standard  PCP/Specialist Visits Compliance with follow-up visit  [Encouraged]  Communication with Charity fundraiser, PCP/Specialists, Pharmacists, Social Work  [employer] Routine Engagement]  Exercise Interventions   Exercise Discussed/Reviewed Exercise Discussed, Assistive device use and maintanence, Exercise Reviewed, Physical Activity, Weight Managment  [Encouraged]  Physical Activity Discussed/Reviewed Physical Activity Discussed, Home Exercise Program (HEP), Types of exercise, Gym, PREP, Physical  Activity Reviewed  [Encouraged]  Weight Management Weight maintenance  [Encouraged]  Education Interventions   Education Provided Provided Education  Provided Verbal Education On Nutrition, Mental Health/Coping with Illness, Eye Care, When to see the doctor, Development worker, community, [facility], Exercise, Medication  Mental Health Interventions   Mental Health Discussed/Reviewed Mental Health Discussed, Anxiety, Depression, Grief and Loss, Mental Health Reviewed, Substance Abuse, Coping Strategies, Suicide, Crisis  [Assessed]  Nutrition Interventions   Nutrition Discussed/Reviewed Nutrition Discussed, Adding fruits and vegetables, Increasing proteins, Decreasing fats, Decreasing salt, Supplemental nutrition, Decreasing sugar intake, Portion sizes, Carbohydrate meal planning, Nutrition Reviewed, Fluid intake  [Encouraged]  Pharmacy Interventions   Pharmacy Dicussed/Reviewed Pharmacy Topics Discussed, Medications and their functions, Medication Adherence, Pharmacy Topics Reviewed, Affording Medications  [Encouraged]  Safety Interventions   Safety Discussed/Reviewed Safety Discussed, Safety Reviewed, Fall Risk, Home Safety  [Encouraged Use of Assistive Devices]  Home Safety Assistive Devices  [Encouraged Consistent Use of Assistive Devices]  Advanced Directive Interventions   Advanced Directives Discussed/Reviewed Advanced Directives Discussed, Advanced Directives Reviewed  [Advanced Directives in Place]  End of Life Palliative  [Encouraged Continued Engagement with Palliative Care Services for Pain Management]      Active Listening & Reflection Utilized.  Verbalization of Feelings Encouraged.  Emotional Support Provided. Solution-Focused Strategies Indicated. Acceptance & Commitment Therapy Initiated. Cognitive Behavioral Therapy Conducted. Client-Centered Therapy Performed. Confirmed Right Eye Cataract Extraction Phacoemulsification & Intraocular Lens Placement Procedure Performed on  12/12/2022 at 8:15 AM, Without Complication. Confirmed Continued Involvement with Home Health Services through [facility] (# [phone]). Confirmed Continued Involvement with Palliative Care Services through [facility] [phone]). Confirmed Continued Receipt of Prescription Medications, Via Pill Packing, through [facility] [phone]). Encouraged Contact with CSW (# [phone]), if You Have Questions, Need Assistance, or If Additional Social Work Needs Are Identified Between Now & Our Next Follow-Up  Outreach Call, Scheduled on 12/26/2022 at 9:45 AM.      Our next appointment is by telephone on 12/26/2022 at 9:45 am.  Please call the care guide team at [phone] if you need to cancel or reschedule your appointment.   If you are experiencing a Mental Health or Behavioral Health Crisis or need someone to talk to, please call the Suicide and Crisis Lifeline: 988 call the [place] National Suicide Prevention Lifeline: 360-579-3895 or TTY: 919-669-2077 TTY 684-687-6220) to talk to a trained counselor call 1-800-273-TALK (toll free, 24 hour hotline) go to [facility] [address], [place] [phone]) call the [facility] Crisis Line: [phone] call 911  Patient verbalizes understanding of instructions and care plan provided today and agrees to view in MyChart. Active MyChart status and patient understanding of how to access instructions and care plan via MyChart confirmed with patient.     Telephone follow up appointment with care management team member scheduled for:   12/26/2022 at 9:45 am.  Danford Bad, BSW, MSW, LCSW  Embedded Practice Social Work Case Manager  [facility], Population Health Direct Dial: [phone]  Fax: [phone] Email: [email or web address]: [email or web address]

## 2022-12-12 NOTE — Anesthesia Postprocedure Evaluation (Signed)
Anesthesia Post Note  Patient: Michael [name]  Procedure(s) Performed: CATARACT EXTRACTION PHACO AND INTRAOCULAR LENS PLACEMENT (IOC) (Right: Eye)  Patient location during evaluation: PACU Anesthesia Type: MAC Level of consciousness: awake and alert Pain management: pain level controlled Vital Signs Assessment: post-procedure vital signs reviewed and stable Respiratory status: spontaneous breathing, nonlabored ventilation, respiratory function stable and patient connected to nasal cannula oxygen Cardiovascular status: stable and blood pressure returned to baseline Postop Assessment: no apparent nausea or vomiting Anesthetic complications: no   There were no known notable events for this encounter.   Last Vitals:  Vitals:   12/12/22 0906 12/12/22 0910  BP:  (!) 163/123  Pulse: 72   Resp: 17   Temp: 36.7 C   SpO2: 96%     Last Pain:  Vitals:   12/12/22 0906  TempSrc: Oral  PainSc: 0-No pain                 Michael [name]

## 2022-12-12 NOTE — Interval H&P Note (Signed)
History and Physical Interval Note:  12/12/2022 8:38 AM  Michael [name]  has presented today for surgery, with the diagnosis of Hypermature cataract- morgagnian, right eye.  The various methods of treatment have been discussed with the patient and family. After consideration of risks, benefits and other options for treatment, the patient has consented to  Procedure(s) with comments: CATARACT EXTRACTION PHACO AND INTRAOCULAR LENS PLACEMENT (IOC) (Right) - Complex case as a surgical intervention.  The patient's history has been reviewed, patient examined, no change in status, stable for surgery.  I have reviewed the patient's chart and labs.  Questions were answered to the patient's satisfaction.     [name]

## 2022-12-12 NOTE — Op Note (Signed)
Date of procedure: 12/12/22  Pre-operative diagnosis: Mature Visually significant age-related cataract, Right Eye (H25.21)  Post-operative diagnosis: Mature Visually significant age-related cataract, Right Eye  Procedure: Complex Removal of cataract via phacoemulsification and insertion of intra-ocular lens Laural Benes and Johnson DIB00 +20.5D into the capsular bag of the Right Eye  Attending surgeon: Rudy Jew. Kazden Largo, MD, MA  Anesthesia: MAC, Topical Akten  Complications: None  Estimated Blood Loss: <66mL (minimal)  Specimens: None  Implants: As above  Indications:  Mature Visually significant age-related cataract, Right Eye  Procedure:  The patient was seen and identified in the pre-operative area. The operative eye was identified and dilated.  The operative eye was marked.  Topical anesthesia was administered to the operative eye.     The patient was then to the operative suite and placed in the supine position.  A timeout was performed confirming the patient, procedure to be performed, and all other relevant information.   The patient's face was prepped and draped in the usual fashion for intra-ocular surgery.  A lid speculum was placed into the operative eye and the surgical microscope moved into place and focused.  A lack of red reflex due to a mature cataract was confirmed.  A superotemporal paracentesis was created using a 20 gauge paracentesis blade.  Vision blue was injected into the anterior chamber.  Shugarcaine was injected into the anterior chamber.  Viscoelastic was injected into the anterior chamber.  A temporal clear-corneal main wound incision was created using a 2.22mm microkeratome.  A continuous curvilinear capsulorrhexis was initiated using an irrigating cystitome and completed using capsulorrhexis forceps.  Hydrodissection and hydrodeliniation were performed.  Viscoelastic was injected into the anterior chamber.  A phacoemulsification handpiece and a chopper as a second  instrument were used to remove the nucleus and epinucleus. The irrigation/aspiration handpiece was used to remove any remaining cortical material.   The capsular bag was reinflated with viscoelastic, checked, and found to be intact. The intraocular lens was inserted into the capsular bag and dialed into place using a kuglen hook.  The irrigation/aspiration handpiece was used to remove any remaining viscoelastic.  The clear corneal wound and paracentesis wounds were then hydrated and checked with Weck-Cels to be watertight. 0.85mL of moxifloxacin was injected into the anterior chamber. The lid-speculum and drape was removed, and the patient's face was cleaned with a wet and dry 4x4. A clear shield was taped over the eye. The patient was taken to the post-operative care unit in good condition, having tolerated the procedure well.  Post-Op Instructions: The patient will follow up at [facility] for a same day post-operative evaluation and will receive all other orders and instructions.

## 2022-12-12 NOTE — Patient Outreach (Signed)
Care Coordination   Follow Up Visit Note   12/12/2022  Name: Michael [name] MRN: [id number] DOB: [birth year]  Michael [name] is a 67 y.o. year old male who sees Michael [name], Michael Hazel, MD for primary care. I spoke with friend/24 hour caregiver, Michael [name] by phone today.  What matters to the patients health and wellness today?  Receive Assistance Completing Medicaid Application & Pursuing Higher Level of Care Options.    Goals Addressed               This Visit's Progress     Receive Assistance Completing Medicaid Application & Pursuing Higher Level of Care Options. (pt-stated)   On track     Care Coordination Interventions:  Interventions Today    Flowsheet Row Most Recent Value  Chronic Disease   Chronic disease during today's visit Diabetes, Chronic Obstructive Pulmonary Disease (COPD), Hypertension (HTN), Congestive Heart Failure (CHF), Chronic Kidney Disease/End Stage Renal Disease (ESRD), Other  General Interventions   General Interventions Discussed/Reviewed General Interventions Discussed, Doctor Visits, General Interventions Reviewed, Durable Medical Equipment (DME), Annual Eye Exam, Level of Care, [employer], Communication with, Health Screening, Vaccines  [Communication with Care Team Members]  Vaccines COVID-19, Flu, Pneumonia, RSV, Shingles, Tetanus/Pertussis/Diphtheria  [Encouraged]  Doctor Visits Discussed/Reviewed Doctor Visits Discussed, Specialist, Doctor Visits Reviewed, Annual Wellness Visits, PCP  [Encouraged]  Health Screening Bone Density, Colonoscopy, Prostate  [Encouraged]  Durable Medical Equipment (DME) Bed side commode, BP Cuff, Glucomoter, Environmental consultant, Wheelchair, Other  [Scales, Cane]  Wheelchair Standard  PCP/Specialist Visits Compliance with follow-up visit  [Encouraged]  Communication with Charity fundraiser, PCP/Specialists, Pharmacists, Social Work  [employer] Routine Engagement]  Exercise Interventions   Exercise Discussed/Reviewed Exercise Discussed,  Assistive device use and maintanence, Exercise Reviewed, Physical Activity, Weight Managment  [Encouraged]  Physical Activity Discussed/Reviewed Physical Activity Discussed, Home Exercise Program (HEP), Types of exercise, Gym, PREP, Physical Activity Reviewed  [Encouraged]  Weight Management Weight maintenance  [Encouraged]  Education Interventions   Education Provided Provided Education  Provided Verbal Education On Nutrition, Mental Health/Coping with Illness, Eye Care, When to see the doctor, Development worker, community, [employer], Exercise, Medication  Mental Health Interventions   Mental Health Discussed/Reviewed Mental Health Discussed, Anxiety, Depression, Grief and Loss, Mental Health Reviewed, Substance Abuse, Coping Strategies, Suicide, Crisis  [Assessed]  Nutrition Interventions   Nutrition Discussed/Reviewed Nutrition Discussed, Adding fruits and vegetables, Increasing proteins, Decreasing fats, Decreasing salt, Supplemental nutrition, Decreasing sugar intake, Portion sizes, Carbohydrate meal planning, Nutrition Reviewed, Fluid intake  [Encouraged]  Pharmacy Interventions   Pharmacy Dicussed/Reviewed Pharmacy Topics Discussed, Medications and their functions, Medication Adherence, Pharmacy Topics Reviewed, Affording Medications  [Encouraged]  Safety Interventions   Safety Discussed/Reviewed Safety Discussed, Safety Reviewed, Fall Risk, Home Safety  [Encouraged Use of Assistive Devices]  Home Safety Assistive Devices  [Encouraged Consistent Use of Assistive Devices]  Advanced Directive Interventions   Advanced Directives Discussed/Reviewed Advanced Directives Discussed, Advanced Directives Reviewed  [Advanced Directives in Place]  End of Life Palliative  [Encouraged Continued Engagement with Palliative Care Services for Pain Management]      Active Listening & Reflection Utilized.  Verbalization of Feelings Encouraged.  Emotional Support Provided. Solution-Focused Strategies  Indicated. Acceptance & Commitment Therapy Initiated. Cognitive Behavioral Therapy Conducted. Client-Centered Therapy Performed. Confirmed Right Eye Cataract Extraction Phacoemulsification & Intraocular Lens Placement Procedure Performed on 12/12/2022 at 8:15 AM, Without Complication. Confirmed Continued Involvement with Home Health Services through [facility] (# [phone]). Confirmed Continued Involvement with Palliative Care Services through [facility] [phone]). Confirmed  Continued Receipt of Prescription Medications, Via Pill Packing, through [employer] [phone]). Encouraged Contact with CSW (# [phone]), if You Have Questions, Need Assistance, or If Additional Social Work Needs Are Identified Between Now & Our Next Follow-Up Outreach Call, Scheduled on 12/26/2022 at 9:45 AM.      SDOH assessments and interventions completed:  Yes.  Care Coordination Interventions:  Yes, provided.   Follow up plan: Follow up call scheduled for 12/26/2022 at 9:45 am.  Encounter Outcome:  Patient Visit Completed.   Danford Bad, BSW, MSW, Printmaker Social Work Case Set designer Health  [facility], Population Health Direct Dial: [phone]  Fax: [phone] Email: [email or web address]

## 2022-12-12 NOTE — Anesthesia Preprocedure Evaluation (Addendum)
Anesthesia Evaluation  Patient identified by MRN, date of birth, ID band Patient awake    Reviewed: Allergy & Precautions, NPO status , Patient's Chart, lab work & pertinent test results, reviewed documented beta blocker date and time   Airway Mallampati: III  TM Distance: >3 FB Neck ROM: Full  Mouth opening: Limited Mouth Opening Comment: Head and neck cancer (HCC) 2019 Right facial basal cell carcinoma s/p resection with right partial mastectomy and partal rhinectomy with skin graft (06/13/17)  Dental  (+) Missing, Loose, Edentulous Upper, Dental Advisory Given,    Pulmonary pneumonia, COPD,  COPD inhaler, Patient abstained from smoking., former smoker Head and neck cancer (HCC) 2019 Right facial basal cell carcinoma s/p resection with right partial mastectomy and partal rhinectomy with skin graft (06/13/17)    Pulmonary exam normal breath sounds clear to auscultation       Cardiovascular Exercise Tolerance: Good hypertension, Pt. on home beta blockers and Pt. on medications + CAD, + Past MI, + Peripheral Vascular Disease (carotid revascularization) and +CHF  Normal cardiovascular exam+ dysrhythmias + Valvular Problems/Murmurs  Rhythm:Irregular Rate:Normal  2019- stress test Narrative & Impression Findings consistent with prior inferior/septal myocardial infarction with mild peri-infarct ischemia. This is a high risk study. Risk primarily based on decreased LVEF. There is a prior infarct with only mild current ischemia  Last echo EF 25%    Neuro/Psych negative neurological ROS  negative psych ROS   GI/Hepatic negative GI ROS, Neg liver ROS,,,  Endo/Other  negative endocrine ROS    Renal/GU CRFRenal diseaseStage 3 CKD  negative genitourinary   Musculoskeletal negative musculoskeletal ROS (+)    Abdominal   Peds negative pediatric ROS (+)  Hematology negative hematology ROS (+) Blood dyscrasia, anemia   Anesthesia  Other Findings Severe disfiguration nose and face from head and neck cancer surgery  Reproductive/Obstetrics negative OB ROS                             Anesthesia Physical Anesthesia Plan  ASA: 4  Anesthesia Plan: MAC   Post-op Pain Management: Minimal or no pain anticipated   Induction: Intravenous  PONV Risk Score and Plan:   Airway Management Planned: Natural Airway and Simple Face Mask  Additional Equipment: None  Intra-op Plan:   Post-operative Plan:   Informed Consent: I have reviewed the patients History and Physical, chart, labs and discussed the procedure including the risks, benefits and alternatives for the proposed anesthesia with the patient or authorized representative who has indicated his/her understanding and acceptance.     Dental advisory given  Plan Discussed with: CRNA  Anesthesia Plan Comments:         Anesthesia Quick Evaluation

## 2022-12-13 NOTE — Patient Instructions (Signed)
Visit Information  Thank you for taking time to visit with me today. Please don't hesitate to contact me if I can be of assistance to you.   Following are the goals we discussed today:   Goals Addressed             This Visit's Progress    Manage CHF, COPD, hypertenson at home  -nurse care coordination services       Care Coordination Goals:  Patient will continue Home Health services with [facility] Patient/family will work with palliative care. Order placed while inpatient.  Patient will work with [facility]/ Care Management Dept regarding LTC placement once discharged Patient will continue to work with care coordination SW as needed Patient will begin to receive medicines via pill packaging from [place]- Continuing to collaborate with pharmacy 12/12/22  Interventions Today    Flowsheet Row Most Recent Value  Chronic Disease   Chronic disease during today's visit Other  [pill packaging [facility] Left message for [name] Updating her on the outreach to [name] confirming patient only has 1 90 day prescription But all his medicine bottles are almost empty. [name] knows to bring any Rx to pharmacy]               Our next appointment is by telephone on 12/22/22 at 3 pm  Please call the care guide team at [phone] if you need to cancel or reschedule your appointment.   If you are experiencing a Mental Health or Behavioral Health Crisis or need someone to talk to, please call the Suicide and Crisis Lifeline: 988 call the [place] National Suicide Prevention Lifeline: (479) 100-0575 or TTY: (662)620-3558 TTY 6052187693) to talk to a trained counselor call 1-800-273-TALK (toll free, 24 hour hotline) call the [facility]: [phone] call 911   No computer access, no preference for copy of AVS    The patient has been provided with contact information for the care management team and has been advised to call with any health related  questions or concerns.    Delance Weide L. Noelle Penner, RN, BSN, [facility]  VBCI Care Management Coordinator  [phone]  Fax: [phone]

## 2022-12-13 NOTE — Patient Outreach (Signed)
  Care Coordination   Follow Up Visit Note   12/13/2022 Name: Michael [name] MRN: [id number] DOB: 1955/08/02  Michael [name] is a 67 y.o. year old male who sees Michael [name], Michael Hazel, MD for primary care. I spoke with Michael Michael [name], caregiver of  Michael [name] by phone today. Unsuccessful calls to and from Michael [name] at [facility] on 12/12/22  What matters to the patients health and wellness today?  Pill packaging to get patient pills to [employer] [facility] so Michael [name] in [name] can review them and begin the packaging process. Was able to updated Michael Michael [name] to assist with taking Michael [name] the medicines to Michael [name]. Michael Michael [name] can complete this process on 12/14/22    Goals Addressed             This Visit's Progress    Manage CHF, COPD, hypertenson at home  -nurse care coordination services       Care Coordination Goals:  Patient will continue Home Health services with Michael [name] Patient/family will work with palliative care. Order placed while inpatient.  Patient will work with [facility]/ Care Management Dept regarding LTC placement once discharged Patient will continue to work with care coordination SW as needed Patient will begin to receive medicines via pill packaging from Michael [name] 12/12/22  Interventions Today    Flowsheet Row Most Recent Value  Chronic Disease   Chronic disease during today's visit Other  [pill packaging [facility] Left message for Michael [name] Updating her on the outreach to Michael Michael [name] confirming patient only has 1 90 day prescription But all his medicine bottles are almost empty. Michael [name] knows to bring any Rx to [name]]               SDOH assessments and interventions completed:  No     Care Coordination Interventions:  Yes, provided   Follow up plan: Follow up call scheduled for 12/22/22    Encounter Outcome:  Patient Visit Completed   Michael Bradford L. Noelle Penner, RN, BSN, [name]  VBCI Care Management  Coordinator  [phone]  Fax: [phone]

## 2022-12-14 ENCOUNTER — Inpatient Hospital Stay: Payer: 59

## 2022-12-14 VITALS — BP 157/77 | HR 70 | Temp 97.7°F | Resp 18

## 2022-12-14 DIAGNOSIS — N1832 Chronic kidney disease, stage 3b: Secondary | ICD-10-CM | POA: Diagnosis not present

## 2022-12-14 DIAGNOSIS — D509 Iron deficiency anemia, unspecified: Secondary | ICD-10-CM | POA: Diagnosis not present

## 2022-12-14 DIAGNOSIS — D631 Anemia in chronic kidney disease: Secondary | ICD-10-CM | POA: Diagnosis not present

## 2022-12-14 DIAGNOSIS — D500 Iron deficiency anemia secondary to blood loss (chronic): Secondary | ICD-10-CM

## 2022-12-14 MED ORDER — SODIUM CHLORIDE 0.9 % IV SOLN: INTRAVENOUS | Status: DC

## 2022-12-14 MED ORDER — ACETAMINOPHEN 325 MG PO TABS
650.0000 mg | ORAL_TABLET | Freq: Once | ORAL | Status: AC
  Administered 2022-12-14: 650 mg via ORAL
  Filled 2022-12-14: qty 2

## 2022-12-14 MED ORDER — SODIUM CHLORIDE 0.9 % IV SOLN
300.0000 mg | Freq: Once | INTRAVENOUS | Status: AC
  Administered 2022-12-14: 300 mg via INTRAVENOUS
  Filled 2022-12-14: qty 300

## 2022-12-14 MED ORDER — CETIRIZINE HCL 10 MG PO TABS
10.0000 mg | ORAL_TABLET | Freq: Once | ORAL | Status: AC
  Administered 2022-12-14: 10 mg via ORAL
  Filled 2022-12-14: qty 1

## 2022-12-14 NOTE — Patient Instructions (Signed)
 [facility] - A DEPT OF [facility]  Discharge Instructions: Thank you for choosing [facility] to provide your oncology and hematology care.  If you have a lab appointment with the Cancer Center - please note that after April 8th, 2024, all labs will be drawn in the cancer center.  You do not have to check in or register with the main entrance as you have in the past but will complete your check-in in the cancer center.  Wear comfortable clothing and clothing appropriate for easy access to any Portacath or PICC line.   We strive to give you quality time with your provider. You may need to reschedule your appointment if you arrive late (15 or more minutes).  Arriving late affects you and other patients whose appointments are after yours.  Also, if you miss three or more appointments without notifying the office, you may be dismissed from the clinic at the provider's discretion.      For prescription refill requests, have your pharmacy contact our office and allow 72 hours for refills to be completed.    Today you received the following:  Venofer.   Iron Sucrose Injection What is this medication? IRON SUCROSE (EYE ern SOO krose) treats low levels of iron (iron deficiency anemia) in people with kidney disease. Iron is a mineral that plays an important role in making red blood cells, which carry oxygen from your lungs to the rest of your body. This medicine may be used for other purposes; ask your health care provider or pharmacist if you have questions. COMMON BRAND NAME(S): Venofer What should I tell my care team before I take this medication? They need to know if you have any of these conditions: Anemia not caused by low iron levels Heart disease High levels of iron in the blood Kidney disease Liver disease An unusual or allergic reaction to iron, other medications, foods, dyes, or preservatives Pregnant or trying to get  pregnant Breastfeeding How should I use this medication? This medication is for infusion into a vein. It is given in a hospital or clinic setting. Talk to your care team about the use of this medication in children. While this medication may be prescribed for children as young as 2 years for selected conditions, precautions do apply. Overdosage: If you think you have taken too much of this medicine contact a poison control center or emergency room at once. NOTE: This medicine is only for you. Do not share this medicine with others. What if I miss a dose? Keep appointments for follow-up doses. It is important not to miss your dose. Call your care team if you are unable to keep an appointment. What may interact with this medication? Do not take this medication with any of the following: Deferoxamine Dimercaprol Other iron products This medication may also interact with the following: Chloramphenicol Deferasirox This list may not describe all possible interactions. Give your health care provider a list of all the medicines, herbs, non-prescription drugs, or dietary supplements you use. Also tell them if you smoke, drink alcohol, or use illegal drugs. Some items may interact with your medicine. What should I watch for while using this medication? Visit your care team regularly. Tell your care team if your symptoms do not start to get better or if they get worse. You may need blood work done while you are taking this medication. You may need to follow a special diet. Talk to your care team. Foods  that contain iron include: whole grains/cereals, dried fruits, beans, or peas, leafy green vegetables, and organ meats (liver, kidney). What side effects may I notice from receiving this medication? Side effects that you should report to your care team as soon as possible: Allergic reactions--skin rash, itching, hives, swelling of the face, lips, tongue, or throat Low blood pressure--dizziness, feeling  faint or lightheaded, blurry vision Shortness of breath Side effects that usually do not require medical attention (report to your care team if they continue or are bothersome): Flushing Headache Joint pain Muscle pain Nausea Pain, redness, or irritation at injection site This list may not describe all possible side effects. Call your doctor for medical advice about side effects. You may report side effects to FDA at 1-800-FDA-1088. Where should I keep my medication? This medication is given in a hospital or clinic. It will not be stored at home. NOTE: This sheet is a summary. It may not cover all possible information. If you have questions about this medicine, talk to your doctor, pharmacist, or health care provider.  2024 Elsevier/Gold Standard (2022-06-24 00:00:00)    To help prevent nausea and vomiting after your treatment, we encourage you to take your nausea medication as directed.  BELOW ARE SYMPTOMS THAT SHOULD BE REPORTED IMMEDIATELY: *FEVER GREATER THAN 100.4 F (38 C) OR HIGHER *CHILLS OR SWEATING *NAUSEA AND VOMITING THAT IS NOT CONTROLLED WITH YOUR NAUSEA MEDICATION *UNUSUAL SHORTNESS OF BREATH *UNUSUAL BRUISING OR BLEEDING *URINARY PROBLEMS (pain or burning when urinating, or frequent urination) *BOWEL PROBLEMS (unusual diarrhea, constipation, pain near the anus) TENDERNESS IN MOUTH AND THROAT WITH OR WITHOUT PRESENCE OF ULCERS (sore throat, sores in mouth, or a toothache) UNUSUAL RASH, SWELLING OR PAIN  UNUSUAL VAGINAL DISCHARGE OR ITCHING   Items with * indicate a potential emergency and should be followed up as soon as possible or go to the Emergency Department if any problems should occur.  Please show the CHEMOTHERAPY ALERT CARD or IMMUNOTHERAPY ALERT CARD at check-in to the Emergency Department and triage nurse.  Should you have questions after your visit or need to cancel or reschedule your appointment, please contact [facility] - A DEPT OF [facility] [phone]  and follow the prompts.  Office hours are 8:00 a.m. to 4:30 p.m. Monday - Friday. Please note that voicemails left after 4:00 p.m. may not be returned until the following business day.  We are closed weekends and major holidays. You have access to a nurse at all times for urgent questions. Please call the main number to the clinic [phone] and follow the prompts.  For any non-urgent questions, you may also contact your provider using MyChart. We now offer e-Visits for anyone 42 and older to request care online for non-urgent symptoms. For details visit [email or web address].   Also download the MyChart app! Go to the app store, search "MyChart", open the app, select [facility], and log in with your MyChart username and password.

## 2022-12-14 NOTE — Progress Notes (Signed)
Patient presents today for iron infusion.  Patient is in satisfactory condition with no new complaints voiced.  Vital signs are stable.  IV placed in R arm.  IV flushed well with good blood return noted.  We will proceed with infusion per provider orders.    Patient tolerated infusion  well with no complaints voiced.  Patient refused to wait the recommended 30 minute post iron wait time.  Patient left ambulatory in stable condition.  Vital signs stable at discharge.  Follow up as scheduled.

## 2022-12-22 ENCOUNTER — Ambulatory Visit: Payer: Self-pay | Admitting: *Deleted

## 2022-12-22 NOTE — Patient Outreach (Signed)
  Care Coordination   12/22/2022 Name: Denali Gotto MRN: [id number] DOB: [birth year]   Care Coordination Outreach Attempts:  An unsuccessful telephone outreach was attempted today to offer the patient information about available care coordination services.  Follow Up Plan:  Additional outreach attempts will be made to offer the patient care coordination information and services.   Encounter Outcome:  Patient Visit Completed Left a message with Mr Michael [name] who confirms he is not aware if the medication packages have been started. Mrs Michael [name] went "to town"    Care Coordination Interventions:  Yes, provided    [name], RN, BSN, [facility]  VBCI Care Management Coordinator  [phone]  Fax: [phone]

## 2022-12-23 ENCOUNTER — Inpatient Hospital Stay: Payer: 59

## 2022-12-23 ENCOUNTER — Other Ambulatory Visit: Payer: Self-pay | Admitting: *Deleted

## 2022-12-23 VITALS — BP 149/99 | HR 60 | Temp 97.5°F | Resp 19 | Wt 146.6 lb

## 2022-12-23 DIAGNOSIS — N1832 Chronic kidney disease, stage 3b: Secondary | ICD-10-CM | POA: Diagnosis not present

## 2022-12-23 DIAGNOSIS — D500 Iron deficiency anemia secondary to blood loss (chronic): Secondary | ICD-10-CM

## 2022-12-23 DIAGNOSIS — D631 Anemia in chronic kidney disease: Secondary | ICD-10-CM | POA: Diagnosis not present

## 2022-12-23 DIAGNOSIS — D509 Iron deficiency anemia, unspecified: Secondary | ICD-10-CM | POA: Diagnosis not present

## 2022-12-23 MED ORDER — CETIRIZINE HCL 10 MG PO TABS
10.0000 mg | ORAL_TABLET | Freq: Once | ORAL | Status: AC
  Administered 2022-12-23: 10 mg via ORAL
  Filled 2022-12-23: qty 1

## 2022-12-23 MED ORDER — SODIUM CHLORIDE 0.9 % IV SOLN
300.0000 mg | Freq: Once | INTRAVENOUS | Status: AC
  Administered 2022-12-23: 300 mg via INTRAVENOUS
  Filled 2022-12-23: qty 5

## 2022-12-23 MED ORDER — ACETAMINOPHEN 325 MG PO TABS
650.0000 mg | ORAL_TABLET | Freq: Once | ORAL | Status: AC
  Administered 2022-12-23: 650 mg via ORAL
  Filled 2022-12-23: qty 2

## 2022-12-23 MED ORDER — SODIUM CHLORIDE 0.9 % IV SOLN: INTRAVENOUS | Status: DC

## 2022-12-23 NOTE — Patient Instructions (Signed)
 [facility] - A DEPT OF [facility]  Discharge Instructions: Thank you for choosing [facility] to provide your oncology and hematology care.  If you have a lab appointment with the Cancer Center - please note that after April 8th, 2024, all labs will be drawn in the cancer center.  You do not have to check in or register with the main entrance as you have in the past but will complete your check-in in the cancer center.  Wear comfortable clothing and clothing appropriate for easy access to any Portacath or PICC line.   We strive to give you quality time with your provider. You may need to reschedule your appointment if you arrive late (15 or more minutes).  Arriving late affects you and other patients whose appointments are after yours.  Also, if you miss three or more appointments without notifying the office, you may be dismissed from the clinic at the provider's discretion.      For prescription refill requests, have your pharmacy contact our office and allow 72 hours for refills to be completed.    Today you received the following:  Venofer.   Iron Sucrose Injection What is this medication? IRON SUCROSE (EYE ern SOO krose) treats low levels of iron (iron deficiency anemia) in people with kidney disease. Iron is a mineral that plays an important role in making red blood cells, which carry oxygen from your lungs to the rest of your body. This medicine may be used for other purposes; ask your health care provider or pharmacist if you have questions. COMMON BRAND NAME(S): Venofer What should I tell my care team before I take this medication? They need to know if you have any of these conditions: Anemia not caused by low iron levels Heart disease High levels of iron in the blood Kidney disease Liver disease An unusual or allergic reaction to iron, other medications, foods, dyes, or preservatives Pregnant or trying to get  pregnant Breastfeeding How should I use this medication? This medication is for infusion into a vein. It is given in a hospital or clinic setting. Talk to your care team about the use of this medication in children. While this medication may be prescribed for children as young as 2 years for selected conditions, precautions do apply. Overdosage: If you think you have taken too much of this medicine contact a poison control center or emergency room at once. NOTE: This medicine is only for you. Do not share this medicine with others. What if I miss a dose? Keep appointments for follow-up doses. It is important not to miss your dose. Call your care team if you are unable to keep an appointment. What may interact with this medication? Do not take this medication with any of the following: Deferoxamine Dimercaprol Other iron products This medication may also interact with the following: Chloramphenicol Deferasirox This list may not describe all possible interactions. Give your health care provider a list of all the medicines, herbs, non-prescription drugs, or dietary supplements you use. Also tell them if you smoke, drink alcohol, or use illegal drugs. Some items may interact with your medicine. What should I watch for while using this medication? Visit your care team regularly. Tell your care team if your symptoms do not start to get better or if they get worse. You may need blood work done while you are taking this medication. You may need to follow a special diet. Talk to your care team. Foods  that contain iron include: whole grains/cereals, dried fruits, beans, or peas, leafy green vegetables, and organ meats (liver, kidney). What side effects may I notice from receiving this medication? Side effects that you should report to your care team as soon as possible: Allergic reactions--skin rash, itching, hives, swelling of the face, lips, tongue, or throat Low blood pressure--dizziness, feeling  faint or lightheaded, blurry vision Shortness of breath Side effects that usually do not require medical attention (report to your care team if they continue or are bothersome): Flushing Headache Joint pain Muscle pain Nausea Pain, redness, or irritation at injection site This list may not describe all possible side effects. Call your doctor for medical advice about side effects. You may report side effects to FDA at 1-800-FDA-1088. Where should I keep my medication? This medication is given in a hospital or clinic. It will not be stored at home. NOTE: This sheet is a summary. It may not cover all possible information. If you have questions about this medicine, talk to your doctor, pharmacist, or health care provider.  2024 Elsevier/Gold Standard (2022-06-24 00:00:00)    To help prevent nausea and vomiting after your treatment, we encourage you to take your nausea medication as directed.  BELOW ARE SYMPTOMS THAT SHOULD BE REPORTED IMMEDIATELY: *FEVER GREATER THAN 100.4 F (38 C) OR HIGHER *CHILLS OR SWEATING *NAUSEA AND VOMITING THAT IS NOT CONTROLLED WITH YOUR NAUSEA MEDICATION *UNUSUAL SHORTNESS OF BREATH *UNUSUAL BRUISING OR BLEEDING *URINARY PROBLEMS (pain or burning when urinating, or frequent urination) *BOWEL PROBLEMS (unusual diarrhea, constipation, pain near the anus) TENDERNESS IN MOUTH AND THROAT WITH OR WITHOUT PRESENCE OF ULCERS (sore throat, sores in mouth, or a toothache) UNUSUAL RASH, SWELLING OR PAIN  UNUSUAL VAGINAL DISCHARGE OR ITCHING   Items with * indicate a potential emergency and should be followed up as soon as possible or go to the Emergency Department if any problems should occur.  Please show the CHEMOTHERAPY ALERT CARD or IMMUNOTHERAPY ALERT CARD at check-in to the Emergency Department and triage nurse.  Should you have questions after your visit or need to cancel or reschedule your appointment, please contact [facility] - A DEPT OF [facility] [phone]  and follow the prompts.  Office hours are 8:00 a.m. to 4:30 p.m. Monday - Friday. Please note that voicemails left after 4:00 p.m. may not be returned until the following business day.  We are closed weekends and major holidays. You have access to a nurse at all times for urgent questions. Please call the main number to the clinic [phone] and follow the prompts.  For any non-urgent questions, you may also contact your provider using MyChart. We now offer e-Visits for anyone 42 and older to request care online for non-urgent symptoms. For details visit [email or web address].   Also download the MyChart app! Go to the app store, search "MyChart", open the app, select [facility], and log in with your MyChart username and password.

## 2022-12-23 NOTE — Progress Notes (Signed)
Patient presents today for iron infusion.  Patient is in satisfactory condition with no new complaints voiced.  Vital signs are stable.  IV placed in R arm.  IV flushed well with good blood return noted.  We will proceed with infusion per provider orders.    IV in R antecubital continued to alarm "occluded".  IV site was clean, dry, and intact with no erythema or swelling noted by RN.  IV site was relocated.   Patient tolerated infusion well with no complaints voiced.  Patient refused to wait the recommended 30 minute post iron wait time.  Patient left ambulatory in stable condition.  Vital signs stable at discharge.  Follow up as scheduled.

## 2022-12-23 NOTE — Patient Instructions (Signed)
Visit Information  Thank you for taking time to visit with me today. Please don't hesitate to contact me if I can be of assistance to you.   Following are the goals we discussed today:   Goals Addressed             This Visit's Progress    Assist with pill packaging services + Manage CHF, COPD, hypertenson at home  -nurse care coordination services       Care Coordination Goals:  Patient will continue Home Health services with [facility] Patient/family will work with palliative care. Order placed while inpatient.  Patient will work with [facility]/ Care Management Dept regarding LTC placement once discharged Patient will continue to work with care coordination SW as needed Patient will begin to receive medicines via pill packaging from [place]- Continuing to collaborate with pharmacy 12/23/22-   Interventions Today    Flowsheet Row Most Recent Value  Chronic Disease   Chronic disease during today's visit Other  [collaborated with [name] at [employer] related to pill packaging services for patient [name] left a message that [name] hasn't brought in pills to review. RN CCM faxed EPIC med list [phone] to [name]]  General Interventions   General Interventions Discussed/Reviewed Communication with  Communication with Pharmacists               Our next appointment is by telephone on 01/23/23 at 1015  Please call the care guide team at [phone] if you need to cancel or reschedule your appointment.   If you are experiencing a Mental Health or Behavioral Health Crisis or need someone to talk to, please call the Suicide and Crisis Lifeline: 988 call the [place] National Suicide Prevention Lifeline: (636) 131-3790 or TTY: 414-014-5080 TTY 2253357958) to talk to a trained counselor call 1-800-273-TALK (toll free, 24 hour hotline) call the [facility]: (902)449-4195 call 911   No computer access, no preference for copy of AVS     The patient  has been provided with contact information for the care management team and has been advised to call with any health related questions or concerns.   Nikkolas Coomes L. Noelle Penner, RN, BSN, [facility]  VBCI Care Management Coordinator  [phone]  Fax: [phone]

## 2022-12-23 NOTE — Patient Outreach (Signed)
  Care Coordination   Collaborate with pharmacy  Visit Note   12/23/2022 Name: Michael [name] MRN: [id number] DOB: 1955-12-08  Michael [name] is a 67 y.o. year old male who sees Michael [name], Michael Hazel, MD for primary care. I  received a voice message from [facility]  What matters to the patients health and wellness today?  Pill packaging-  Michael [name] at [employer] confirms patient's medicines has not been brought to pharmacy for review by Michael [name] Michael [name] has been informed that this task needs to be completed by RN CM      Goals Addressed             This Visit's Progress    Assist with pill packaging services + Manage CHF, COPD, hypertenson at home  -nurse care coordination services       Care Coordination Goals:  Patient will continue Home Health services with [facility] Patient/family will work with palliative care. Order placed while inpatient.  Patient will work with [facility]/ Care Management Dept regarding LTC placement once discharged Patient will continue to work with care coordination SW as needed Patient will begin to receive medicines via pill packaging from [place]- Continuing to collaborate with pharmacy 12/23/22-   Interventions Today    Flowsheet Row Most Recent Value  Chronic Disease   Chronic disease during today's visit Other  [collaborated with Michael [name] at [employer] related to pill packaging services for patient Michael [name] left a message that Michael [name] hasn't brought in pills to review. RN CCM faxed EPIC med list [phone] to Michael [name]]  General Interventions   General Interventions Discussed/Reviewed Communication with  Communication with Pharmacists               SDOH assessments and interventions completed:  No     Care Coordination Interventions:  Yes, provided   Follow up plan: Follow up call scheduled for 01/23/23    Encounter Outcome:  Patient Visit Completed   Michael Bradford L. Noelle Penner, RN, BSN, [facility]  VBCI Care  Management Coordinator  [phone]  Fax: [phone]

## 2022-12-26 ENCOUNTER — Ambulatory Visit: Payer: Self-pay | Admitting: *Deleted

## 2022-12-26 ENCOUNTER — Encounter: Payer: Self-pay | Admitting: *Deleted

## 2022-12-26 NOTE — Patient Instructions (Signed)
Visit Information  Thank you for taking time to visit with me today. Please don't hesitate to contact me if I can be of assistance to you.   Following are the goals we discussed today:   Goals Addressed               This Visit's Progress     Receive Assistance Pursuing Higher Level of Care Placement Options. (pt-stated)   On track     Care Coordination Interventions:  Interventions Today    Flowsheet Row Most Recent Value  Chronic Disease   Chronic disease during today's visit Chronic Obstructive Pulmonary Disease (COPD), Congestive Heart Failure (CHF), Hypertension (HTN), Chronic Kidney Disease/End Stage Renal Disease (ESRD), Other  [Fall Risk, Requires Minimal Assistance with Activities of Daily Living, Needs Prescription Medications Administered]  General Interventions   General Interventions Discussed/Reviewed General Interventions Discussed, General Interventions Reviewed, Durable Medical Equipment (DME), [employer], Communication with, Doctor Visits  [Communication with Care Team Members]  Doctor Visits Discussed/Reviewed Doctor Visits Discussed, Specialist, Doctor Visits Reviewed, Annual Wellness Visits, PCP  [Encouraged Routine Engagement]  Horticulturist, commercial (DME) Bed side commode, BP Cuff, Environmental consultant, Biomedical scientist, Other  [Prescription Eyeglasses, Hand-Held [employer], Engineer, materials in [place], [employer] & Scales]  Wheelchair Standard  PCP/Specialist Visits Compliance with follow-up visit  [Encouraged]  Communication with PCP/Specialists, Charity fundraiser, Pharmacists, Social Work  [Encouraged Routine Engagement]  Level of Care --  Therapist, occupational in [place] in Adult Day Care Program, Applying for [employer] or Receiving Assistance Pursuing Higher Level of Care Placement Options]  Applications Other  [employer] Active [employer] Access [employer] Status]  Education Interventions   Education Provided Provided Education  Provided Verbal Education On Mental Health/Coping  with Illness, When to see the doctor, [employer], Medication  Applications Other  [employer] Active [employer] Status]  Mental Health Interventions   Mental Health Discussed/Reviewed Crisis, Suicide, Mental Health Discussed, Anxiety, Depression, Grief and Loss, Mental Health Reviewed, Substance Abuse, Coping Strategies  [Assessed Mental Health Status]  Pharmacy Interventions   Pharmacy Dicussed/Reviewed Pharmacy Topics Discussed, Medications and their functions, Medication Adherence, Pharmacy Topics Reviewed, Affording Medications  [Encouraged Routine Engagement with [facility] for Pill Packaging]  Medication Adherence --  [Confirmed Medication Compliance]  Safety Interventions   Safety Discussed/Reviewed Safety Discussed, Safety Reviewed, Fall Risk  [Encouraged Routine Use of Assistive Devices]  Home Safety Assistive Devices  [Encouraged Continued Engagement with [facility]]  Advanced Directive Interventions   Advanced Directives Discussed/Reviewed Advanced Directives Discussed  [Advanced Directives Initiated,  Copies Requested for Chart]  End of Life Palliative  [Encouraged Routine Engagement with [facility]]      Active Listening & Reflection Utilized.  Verbalization of Feelings Encouraged.  Emotional Support Provided. Solution-Focused Strategies Implicated. Acceptance & Commitment Therapy Initiated. Cognitive Behavioral Therapy Indicated. Client-Centered Therapy Performed. Confirmed Continued Involvement with Home Health Services through [facility] (# [phone]). Confirmed Continued Involvement with Palliative Care Services through [facility] [phone]). Confirmed Continued Receipt of Prescription Medications, Via Pill Packing, through [facility] [phone]). Encouraged Routine Engagement with Danford Bad, Licensed Clinical Social Worker with [facility] [phone]), if You Have Questions, Need Assistance, or If Additional Social Work Needs Are Identified Between Now & Our Next Follow-Up Outreach Call, Scheduled on 01/17/2023 at 10:30 AM.      Our next appointment is by telephone on 01/17/2023 at 10:30 am.  Please call the care guide team at [phone] if you need to cancel or  reschedule your appointment.   If you are experiencing a Mental Health or Behavioral Health Crisis or need someone to talk to, please call the Suicide and Crisis Lifeline: 988 call the [place] National Suicide Prevention Lifeline: (262)223-5535 or TTY: (564)181-2338 TTY 220-077-7480) to talk to a trained counselor call 1-800-273-TALK (toll free, 24 hour hotline) go to [facility] [address], [place] [phone]) call the [facility] Crisis Line: [phone] call 911  Patient verbalizes understanding of instructions and care plan provided today and agrees to view in MyChart. Active MyChart status and patient understanding of how to access instructions and care plan via MyChart confirmed with patient.     Telephone follow up appointment with care management team member scheduled for:  01/17/2023 at 10:30 am.  Danford Bad, BSW, MSW, LCSW  Embedded Practice Social Work Case Manager  [facility], Population Health Direct Dial: [phone]  Fax: [phone] Email: [email or web address] Website: [email or web address]

## 2022-12-26 NOTE — Patient Outreach (Signed)
Care Coordination   Follow Up Visit Note   12/26/2022  Name: Michael [name] MRN: [id number] DOB: Jan 16, 1956  Michael [name] is a 67 y.o. year old male who sees [name], [name], MD for primary care. I spoke with [name] by phone today.  What matters to the patients health and wellness today?  Receive Assistance Pursuing Higher Level of Care Placement Options.   Goals Addressed               This Visit's Progress     Receive Assistance Pursuing Higher Level of Care Placement Options. (pt-stated)   On track     Care Coordination Interventions:  Interventions Today    Flowsheet Row Most Recent Value  Chronic Disease   Chronic disease during today's visit Chronic Obstructive Pulmonary Disease (COPD), Congestive Heart Failure (CHF), Hypertension (HTN), Chronic Kidney Disease/End Stage Renal Disease (ESRD), Other  [Fall Risk, Requires Minimal Assistance with Activities of Daily Living, Needs Prescription Medications Administered]  General Interventions   General Interventions Discussed/Reviewed General Interventions Discussed, General Interventions Reviewed, Durable Medical Equipment (DME), [employer], Communication with, Doctor Visits  [Communication with Care Team Members]  Doctor Visits Discussed/Reviewed Doctor Visits Discussed, Specialist, Doctor Visits Reviewed, Annual Wellness Visits, PCP  [Encouraged Routine Engagement]  Horticulturist, commercial (DME) Bed side commode, BP Cuff, Environmental consultant, Biomedical scientist, Other  [Prescription Eyeglasses, Hand-Held [employer], Engineer, materials in [place], [employer] & Scales]  Wheelchair Standard  PCP/Specialist Visits Compliance with follow-up visit  [Encouraged]  Communication with PCP/Specialists, Charity fundraiser, Pharmacists, Social Work  [Encouraged Routine Engagement]  Level of Care --  Therapist, occupational in [place]uary in Adult Day Care Program, Applying for [employer] or Receiving Assistance Pursuing Higher Level of Care Placement  Options]  Applications Other  [employer] Active [employer] Access [employer] Status]  Education Interventions   Education Provided Provided Education  Provided Verbal Education On Mental Health/Coping with Illness, When to see the doctor, [facility], Medication  Applications Other  [employer] Active [employer] Status]  Mental Health Interventions   Mental Health Discussed/Reviewed Crisis, Suicide, Mental Health Discussed, Anxiety, Depression, Grief and Loss, Mental Health Reviewed, Substance Abuse, Coping Strategies  [Assessed Mental Health Status]  Pharmacy Interventions   Pharmacy Dicussed/Reviewed Pharmacy Topics Discussed, Medications and their functions, Medication Adherence, Pharmacy Topics Reviewed, Affording Medications  [Encouraged Routine Engagement with [facility] for Pill Packaging]  Medication Adherence --  [Confirmed Medication Compliance]  Safety Interventions   Safety Discussed/Reviewed Safety Discussed, Safety Reviewed, Fall Risk  [Encouraged Routine Use of Assistive Devices]  Home Safety Assistive Devices  [Encouraged Continued Engagement with [facility]]  Advanced Directive Interventions   Advanced Directives Discussed/Reviewed Advanced Directives Discussed  [Advanced Directives Initiated,  Copies Requested for Chart]  End of Life Palliative  [Encouraged Routine Engagement with [facility]]      Active Listening & Reflection Utilized.  Verbalization of Feelings Encouraged.  Emotional Support Provided. Solution-Focused Strategies Implicated. Acceptance & Commitment Therapy Initiated. Cognitive Behavioral Therapy Indicated. Client-Centered Therapy Performed. Confirmed Continued Involvement with Home Health Services through [facility] (# [phone]). Confirmed Continued Involvement with Palliative Care Services through [facility] [phone]). Confirmed Continued Receipt of Prescription  Medications, Via Pill Packing, through [employer] [phone]). Encouraged Routine Engagement with Danford Bad, Licensed Clinical Social Worker with [facility] [phone]), if You Have Questions, Need Assistance, or If Additional Social Work Needs Are Identified Between Now & Our Next Follow-Up Outreach Call, Scheduled on 01/17/2023 at  10:30 AM.        SDOH assessments and interventions completed:  Yes.  SDOH Interventions Today    Flowsheet Row Most Recent Value  SDOH Interventions   Food Insecurity Interventions Intervention Not Indicated  Housing Interventions Intervention Not Indicated  Transportation Interventions Intervention Not Indicated, Patient Resources (Friends/Family), Payor Benefit  Utilities Interventions Intervention Not Indicated  Alcohol Usage Interventions Intervention Not Indicated (Score <7)  Financial Strain Interventions Intervention Not Indicated  Physical Activity Interventions Intervention Not Indicated  Stress Interventions Intervention Not Indicated  Social Connections Interventions Intervention Not Indicated  Health Literacy Interventions Intervention Not Indicated     Care Coordination Interventions:  Yes, provided.   Follow up plan: Follow up call scheduled for 01/17/2023 at 10:30 am.  Encounter Outcome:  Patient Visit Completed.   Danford Bad, BSW, MSW, Printmaker Social Work Case Set designer Health  [facility] [place], Population Health Direct Dial: [phone]  Fax: [phone] Email: [email or web address]

## 2023-01-16 DIAGNOSIS — H25812 Combined forms of age-related cataract, left eye: Secondary | ICD-10-CM | POA: Diagnosis not present

## 2023-01-17 ENCOUNTER — Ambulatory Visit: Payer: Self-pay | Admitting: *Deleted

## 2023-01-17 NOTE — Patient Instructions (Signed)
Visit Information  Thank you for taking time to visit with me today. Please don't hesitate to contact me if I can be of assistance to you.   Following are the goals we discussed today:   Goals Addressed               This Visit's Progress     Receive Assistance Pursuing Higher Level of Care Placement Options. (pt-stated)   On track     Care Coordination Interventions:    Interventions Today    Flowsheet Row Most Recent Value  Chronic Disease   Chronic disease during today's visit Chronic Obstructive Pulmonary Disease (COPD), Congestive Heart Failure (CHF), Hypertension (HTN), Chronic Kidney Disease/End Stage Renal Disease (ESRD), Other  [Fall Risk, Requires Minimal Assistance with Activities of Daily Living, Now Able to Self-Administer Prescription Medications with Pill Packaging.]  General Interventions   General Interventions Discussed/Reviewed General Interventions Discussed, Doctor Visits, Durable Medical Equipment (DME), General Interventions Reviewed, [employer], Health Screening, Level of Care, Communication with, Vaccines  [Encouraged Routine Engagement with Care Team Members.]  Vaccines COVID-19, Flu, Pneumonia, RSV, Shingles, Tetanus/Pertussis/Diphtheria  [Encouraged Annual Vaccinations.]  Doctor Visits Discussed/Reviewed Doctor Visits Discussed, Doctor Visits Reviewed, Annual Wellness Visits, PCP, Specialist  [Encouraged Routine Engagement with Care Team Members.]  Health Screening Bone Density, Colonoscopy, Prostate  [Encouraged Routine Health Screenings.]  Durable Medical Equipment (DME) Bed side commode, BP Cuff, Environmental consultant, Wheelchair, Other  [Prescription Eyeglasses, Hand-Held [employer], Engineer, materials in [employer], Medical laboratory scientific officer & Scales.]  Wheelchair Standard  PCP/Specialist Visits Compliance with follow-up visit  [Encouraged Routine Engagement with Care Team Members.]  Communication with PCP/Specialists, Charity fundraiser, Pharmacists, Social Work  [employer] Routine Engagement with Care  Team Members.]  Level of Care Adult Daycare, Air traffic controller, Assisted Living, Skilled Nursing Facility  [Confirmed Disinterest in Enrollment in Adult Day Care Program or Receiving Assistance Pursuing Higher Level of Care Placement Options (I.e Assisted Living Versus Skilled Nursing Facility).]  Applications Medicaid, Personal Care Services  [facility] Active [place] Access Medicaid Coverage. Confirmed Disinterest in Applying for Personal Care Services.]  Exercise Interventions   Exercise Discussed/Reviewed Exercise Discussed, Exercise Reviewed, Physical Activity, Weight Managment, Assistive device use and maintanence  [Encouraged Daily Exercise Regimen, as Tolerated.]  Physical Activity Discussed/Reviewed Physical Activity Discussed, Gym, Physical Activity Reviewed, Types of exercise, Home Exercise Program (HEP), PREP  [Encouraged Increased Level of Activity, Inside & Outside the Home.]  Weight Management Weight maintenance  [Encouraged Heart-Healthy, High Protein, Reduced Sugar Diet.]  Education Interventions   Education Provided Provided Education  [Thoroughly Reviewed Educational Material & Entertained Questions to Ensure Understanding.]  Provided Verbal Education On Nutrition, Exercise, Medication, When to see the doctor, Mental Health/Coping with Illness, Programmer, applications, Air traffic controller, Development worker, community  [Encouraged Consideration & Implementation.]  Ship broker, Personal Care Services  [employer] Active [employer] Coverage. Confirmed Disinterest in Applying for Personal Care Services.]  Mental Health Interventions   Mental Health Discussed/Reviewed Mental Health Discussed, Mental Health Reviewed, Coping Strategies, Crisis, Anxiety, Depression, Grief and Loss, Substance Abuse, Suicide, Other  [Assessed Mental Health & Cognitive Status.]  Nutrition Interventions   Nutrition Discussed/Reviewed Nutrition Discussed, Portion sizes, Decreasing sugar intake, Nutrition Reviewed,  Increasing proteins, Carbohydrate meal planning, Adding fruits and vegetables, Decreasing fats, Decreasing salt, Fluid intake, Supplemental nutrition  [Encouraged Healthy Diet & Supplemental Nutrition.]  Pharmacy Interventions   Pharmacy Dicussed/Reviewed Pharmacy Topics Discussed, Affording Medications, Pharmacy Topics Reviewed, Medications and their functions, Medication Adherence  [Confirmed Ability to Afford Prescription Medications.]  Medication Adherence --  [Confirmed Medication Compliance &  Ability to Self-Administer Prescription Medications Now That They Are Pre-Packaged.]  Safety Interventions   Safety Discussed/Reviewed Safety Discussed, Safety Reviewed, Fall Risk, Home Safety  [Encouraged Consideration of Home Safety Evaluation.]  Home Safety Assistive Devices, Need for home safety assessment  [Encouraged Routine Use of Assistive Devices.]  Advanced Directive Interventions   Advanced Directives Discussed/Reviewed Advanced Directives Discussed, Advanced Directives Reviewed  [Confirmed Initiation of Advanced Directives (Living Will & Healthcare Power of Attorney Documents), Requesting Copies to Scan into Electronic Medical Record in Epic.]  End of Life Palliative  [facility] Care Services Provided through [place] [facility].]      Active Listening & Reflection Utilized.  Verbalization of Feelings Encouraged.  Emotional Support Provided. Acceptance & Commitment Therapy Implemented. Cognitive Behavioral Therapy Initiated. Client-Centered Therapy Indicated. Encouraged Routine Engagement with Danford Bad, Licensed Clinical Social Worker with [facility] [phone]), if You Have Questions, Need Assistance, or If Additional Social Work Needs Are Identified Between Now & Our Next Follow-Up Outreach Call, Scheduled on 02/13/2023 at 9:45 AM.      Our next appointment is by telephone on 02/13/2023 at 9:45 am.  Please call the care guide team at [phone] if  you need to cancel or reschedule your appointment.   If you are experiencing a Mental Health or Behavioral Health Crisis or need someone to talk to, please call the Suicide and Crisis Lifeline: 988 call the [place] National Suicide Prevention Lifeline: 760 049 1735 or TTY: 559-355-4123 TTY (406)289-3667) to talk to a trained counselor call 1-800-273-TALK (toll free, 24 hour hotline) go to [facility] [address] [place] [phone]) call the [facility] Crisis Line: [phone] call 911  Patient verbalizes understanding of instructions and care plan provided today and agrees to view in MyChart. Active MyChart status and patient understanding of how to access instructions and care plan via MyChart confirmed with patient.     Telephone follow up appointment with care management team member scheduled for:  02/13/2023 at 9:45 am.  Danford Bad, BSW, MSW, LCSW  Embedded Practice Social Work Case Manager  [facility], Population Health Direct Dial: [phone]  Fax: [phone] Email: [email or web address]: [email or web address]

## 2023-01-17 NOTE — Patient Outreach (Signed)
Care Coordination   Follow Up Visit Note   01/17/2023  Name: Michael [name] MRN: [id number] DOB: Sep 15, 1955  Michael [name] is a 67 y.o. year old male who sees [name], [name], MD for primary care. I spoke with [name] and friend/caregiver, [name] by phone today.  What matters to the patients health and wellness today?  Receive Assistance Pursuing Higher Level of Care Placement Options.    Goals Addressed               This Visit's Progress     Receive Assistance Pursuing Higher Level of Care Placement Options. (pt-stated)   On track     Care Coordination Interventions:    Interventions Today    Flowsheet Row Most Recent Value  Chronic Disease   Chronic disease during today's visit Chronic Obstructive Pulmonary Disease (COPD), Congestive Heart Failure (CHF), Hypertension (HTN), Chronic Kidney Disease/End Stage Renal Disease (ESRD), Other  [Fall Risk, Requires Minimal Assistance with Activities of Daily Living, Now Able to Self-Administer Prescription Medications with Pill Packaging.]  General Interventions   General Interventions Discussed/Reviewed General Interventions Discussed, Doctor Visits, Durable Medical Equipment (DME), General Interventions Reviewed, [employer], Health Screening, Level of Care, Communication with, Vaccines  [Encouraged Routine Engagement with Care Team Members.]  Vaccines COVID-19, Flu, Pneumonia, RSV, Shingles, Tetanus/Pertussis/Diphtheria  [Encouraged Annual Vaccinations.]  Doctor Visits Discussed/Reviewed Doctor Visits Discussed, Doctor Visits Reviewed, Annual Wellness Visits, PCP, Specialist  [Encouraged Routine Engagement with Care Team Members.]  Health Screening Bone Density, Colonoscopy, Prostate  [Encouraged Routine Health Screenings.]  Durable Medical Equipment (DME) Bed side commode, BP Cuff, Environmental consultant, Wheelchair, Other  [Prescription Eyeglasses, Hand-Held [employer], Engineer, materials in [place], Medical laboratory scientific officer & Scales.]   Wheelchair Standard  PCP/Specialist Visits Compliance with follow-up visit  [Encouraged Routine Engagement with Care Team Members.]  Communication with PCP/Specialists, Charity fundraiser, Pharmacists, Social Work  [employer] Routine Engagement with Care Team Members.]  Level of Care Adult Daycare, Air traffic controller, Assisted Living, Skilled Nursing Facility  [Confirmed Disinterest in Enrollment in Adult Day Care Program or Receiving Assistance Pursuing Higher Level of Care Placement Options (I.e Assisted Living Versus Skilled Nursing Facility).]  Applications Medicaid, Personal Care Services  [facility] Active [place] Access Medicaid Coverage. Confirmed Disinterest in Applying for Personal Care Services.]  Exercise Interventions   Exercise Discussed/Reviewed Exercise Discussed, Exercise Reviewed, Physical Activity, Weight Managment, Assistive device use and maintanence  [Encouraged Daily Exercise Regimen, as Tolerated.]  Physical Activity Discussed/Reviewed Physical Activity Discussed, Gym, Physical Activity Reviewed, Types of exercise, Home Exercise Program (HEP), PREP  [Encouraged Increased Level of Activity, Inside & Outside the Home.]  Weight Management Weight maintenance  [Encouraged Heart-Healthy, High Protein, Reduced Sugar Diet.]  Education Interventions   Education Provided Provided Education  [Thoroughly Reviewed Educational Material & Entertained Questions to Ensure Understanding.]  Provided Verbal Education On Nutrition, Exercise, Medication, When to see the doctor, Mental Health/Coping with Illness, Programmer, applications, Air traffic controller, Development worker, community  [Encouraged Consideration & Implementation.]  Ship broker, Personal Care Services  [employer] Active [employer] Coverage. Confirmed Disinterest in Applying for Personal Care Services.]  Mental Health Interventions   Mental Health Discussed/Reviewed Mental Health Discussed, Mental Health Reviewed, Coping Strategies, Crisis, Anxiety,  Depression, Grief and Loss, Substance Abuse, Suicide, Other  [Assessed Mental Health & Cognitive Status.]  Nutrition Interventions   Nutrition Discussed/Reviewed Nutrition Discussed, Portion sizes, Decreasing sugar intake, Nutrition Reviewed, Increasing proteins, Carbohydrate meal planning, Adding fruits and vegetables, Decreasing fats, Decreasing salt, Fluid intake, Supplemental nutrition  [Encouraged Healthy Diet & Supplemental  Nutrition.]  Pharmacy Interventions   Pharmacy Dicussed/Reviewed Pharmacy Topics Discussed, Affording Medications, Pharmacy Topics Reviewed, Medications and their functions, Medication Adherence  [Confirmed Ability to Afford Prescription Medications.]  Medication Adherence --  [Confirmed Medication Compliance & Ability to Self-Administer Prescription Medications Now That They Are Pre-Packaged.]  Safety Interventions   Safety Discussed/Reviewed Safety Discussed, Safety Reviewed, Fall Risk, Home Safety  [Encouraged Consideration of Home Safety Evaluation.]  Home Safety Assistive Devices, Need for home safety assessment  [Encouraged Routine Use of Assistive Devices.]  Advanced Directive Interventions   Advanced Directives Discussed/Reviewed Advanced Directives Discussed, Advanced Directives Reviewed  [Confirmed Initiation of Advanced Directives (Living Will & Healthcare Power of Corporate treasurer), Requesting Copies to Scan into Electronic Medical Record in Epic.]  End of Life Palliative  [facility] Care Services Provided through [facility].]      Active Listening & Reflection Utilized.  Verbalization of Feelings Encouraged.  Emotional Support Provided. Acceptance & Commitment Therapy Implemented. Cognitive Behavioral Therapy Initiated. Client-Centered Therapy Indicated. Encouraged Routine Engagement with Danford Bad, Licensed Clinical Social Worker with [facility] [phone]), if You Have Questions, Need Assistance, or If  Additional Social Work Needs Are Identified Between Now & Our Next Follow-Up Outreach Call, Scheduled on 02/13/2023 at 9:45 AM.      SDOH assessments and interventions completed:  Yes.  Care Coordination Interventions:  Yes, provided.   Follow up plan: Follow up call scheduled for 02/13/2023 at 9:45 am.  Encounter Outcome:  Patient Visit Completed.   Danford Bad, BSW, MSW, Printmaker Social Work Case Set designer Health  [facility], Population Health Direct Dial: [phone]  Fax: [phone] Email: [email or web address]: [email or web address]

## 2023-01-19 ENCOUNTER — Encounter (HOSPITAL_COMMUNITY): Payer: Self-pay

## 2023-01-19 ENCOUNTER — Other Ambulatory Visit: Payer: Self-pay

## 2023-01-19 ENCOUNTER — Encounter (HOSPITAL_COMMUNITY)
Admission: RE | Admit: 2023-01-19 | Discharge: 2023-01-19 | Disposition: A | Discharge status: Home / Self Care | Payer: 59 | Source: Ambulatory Visit | Attending: Ophthalmology | Admitting: Ophthalmology

## 2023-01-19 ENCOUNTER — Encounter: Payer: Self-pay | Admitting: Hematology

## 2023-01-19 NOTE — H&P (Signed)
Surgical History & Physical  Patient Name: Michael [name]  DOB: 02/16/1955  Surgery: Cataract extraction with intraocular lens implant phacoemulsification; Left Eye Surgeon: [name] MD Surgery Date: 01/23/2023 Pre-Op Date: 12/19/2022  HPI: A 62 Yr. old male patient 1. The patient is returning after cataract surgery. The right eye is affected. Since the last visit, the affected area is doing well. The patient's vision is stable. The condition's severity is constant. Patient is following medication instructions. Patient also presents today for persistent blurry vision in the left eye secondary to a cataract. The vision has been blurry for several months. The vision is getting worse. He has trouble seeing faces across a room and reading. This is negatively affecting the patient's quality of life and the patient is unable to function adequately in life with the current level of vision. Pt. is ready to proceed with surgery on OS, due to blurred vision. HPI was performed by [name] .  Medical History: Cataracts Lazy Eye  Cancer High Blood Pressure Lung Problems  Review of Systems Negative Allergic/Immunologic Hypertension Cardiovascular Negative Constitutional Negative Ear, Nose, Mouth & Throat Negative Endocrine Cataract Eyes Negative Gastrointestinal Negative Genitourinary Negative Hemotologic/Lymphatic Negative Integumentary Negative Musculoskeletal Negative Neurological Negative Psychiatry Lung problems Respiratory  Social Current every day smoker   Medication Moxifloxacin, Ilevro, Prednisolone acetate,  Pt will bring madication list,  atorvastatin ,  torsemide ,  potassium chloride ,  losartan ,  carvedilol ,  Repatha SureClick ,  Breztri Aerosphere   Sx/Procedures [employer] Sx, Phaco c IOL OD,  Carotid Artery R, Nasal skin cancer removal/skin graft  Drug Allergies  NKDA  History & Physical: Heent: Cataract NECK: supple without bruits LUNGS: lungs clear  to auscultation CV: regular rate and rhythm Abdomen: soft and non-tender  Impression & Plan: Assessment: 1.  CATARACT EXTRACTION STATUS; Right Eye (Z98.41) 2.  COMBINED FORMS AGE RELATED CATARACT; Left Eye (H25.812)  Plan: 1.  1 week after cataract surgery. Doing well with improved vision and normal eye pressure. Call with any problems or concerns. Stop Vigamox. Continue Ilevro 1 drop 1x/day for 3 more weeks. Continue Pred Acetate 1 drop 2x/day for 3 more weeks.  2.  Cataract accounts for the patient's decreased vision. This visual impairment is not correctable with a tolerable change in glasses or contact lenses. Cataract surgery with an implantation of a new lens should significantly improve the visual and functional status of the patient. Discussed all risks, benefits, alternatives, and potential complications. Discussed the procedures and recovery. Patient desires to have surgery. A-scan ordered and performed today for intra-ocular lens calculations. The surgery will be performed in order to improve vision for driving, reading, and for eye examinations. Recommend phacoemulsification with intra-ocular lens. Recommend Dextenza for post-operative pain and inflammation. Left Eye. Dilates well - shugarcaine by protocol.

## 2023-01-23 ENCOUNTER — Ambulatory Visit: Payer: Self-pay | Admitting: *Deleted

## 2023-01-23 ENCOUNTER — Ambulatory Visit (HOSPITAL_COMMUNITY): Payer: 59 | Admitting: Anesthesiology

## 2023-01-23 ENCOUNTER — Encounter (HOSPITAL_COMMUNITY): Payer: Self-pay | Admitting: Ophthalmology

## 2023-01-23 ENCOUNTER — Ambulatory Visit (HOSPITAL_COMMUNITY)
Admission: RE | Admit: 2023-01-23 | Discharge: 2023-01-23 | Disposition: A | Discharge status: Home / Self Care | Payer: 59 | Attending: Ophthalmology | Admitting: Ophthalmology

## 2023-01-23 ENCOUNTER — Encounter (HOSPITAL_COMMUNITY)
Admission: RE | Disposition: A | Discharge status: Home / Self Care | Payer: Self-pay | Source: Home / Self Care | Attending: Ophthalmology

## 2023-01-23 DIAGNOSIS — I130 Hypertensive heart and chronic kidney disease with heart failure and stage 1 through stage 4 chronic kidney disease, or unspecified chronic kidney disease: Secondary | ICD-10-CM | POA: Diagnosis not present

## 2023-01-23 DIAGNOSIS — I700 Atherosclerosis of aorta: Secondary | ICD-10-CM | POA: Insufficient documentation

## 2023-01-23 DIAGNOSIS — H25812 Combined forms of age-related cataract, left eye: Secondary | ICD-10-CM

## 2023-01-23 DIAGNOSIS — I2510 Atherosclerotic heart disease of native coronary artery without angina pectoris: Secondary | ICD-10-CM | POA: Insufficient documentation

## 2023-01-23 DIAGNOSIS — J449 Chronic obstructive pulmonary disease, unspecified: Secondary | ICD-10-CM | POA: Diagnosis not present

## 2023-01-23 DIAGNOSIS — I509 Heart failure, unspecified: Secondary | ICD-10-CM | POA: Insufficient documentation

## 2023-01-23 DIAGNOSIS — F17200 Nicotine dependence, unspecified, uncomplicated: Secondary | ICD-10-CM | POA: Diagnosis not present

## 2023-01-23 DIAGNOSIS — I252 Old myocardial infarction: Secondary | ICD-10-CM | POA: Diagnosis not present

## 2023-01-23 DIAGNOSIS — N183 Chronic kidney disease, stage 3 unspecified: Secondary | ICD-10-CM | POA: Diagnosis not present

## 2023-01-23 HISTORY — PX: CATARACT EXTRACTION W/PHACO: SHX586

## 2023-01-23 SURGERY — PHACOEMULSIFICATION, CATARACT, WITH IOL INSERTION
Anesthesia: Monitor Anesthesia Care | Site: Eye | Laterality: Left

## 2023-01-23 MED ORDER — SODIUM CHLORIDE 0.9% FLUSH: 3.0000 mL | INTRAVENOUS | Status: DC | PRN

## 2023-01-23 MED ORDER — TROPICAMIDE 1 % OP SOLN
1.0000 [drp] | OPHTHALMIC | Status: AC | PRN
Start: 2023-01-23 — End: 2023-01-23
  Administered 2023-01-23 (×3): 1 [drp] via OPHTHALMIC

## 2023-01-23 MED ORDER — LIDOCAINE HCL 3.5 % OP GEL
1.0000 | Freq: Once | OPHTHALMIC | Status: AC
  Administered 2023-01-23: 1 via OPHTHALMIC

## 2023-01-23 MED ORDER — MOXIFLOXACIN HCL 5 MG/ML IO SOLN
INTRAOCULAR | Status: DC | PRN
  Administered 2023-01-23: .3 mL via OPHTHALMIC

## 2023-01-23 MED ORDER — SODIUM HYALURONATE 23MG/ML IO SOSY
PREFILLED_SYRINGE | INTRAOCULAR | Status: DC | PRN
  Administered 2023-01-23: .6 mL via INTRAOCULAR

## 2023-01-23 MED ORDER — TETRACAINE HCL 0.5 % OP SOLN
1.0000 [drp] | OPHTHALMIC | Status: AC | PRN
Start: 2023-01-23 — End: 2023-01-23
  Administered 2023-01-23 (×3): 1 [drp] via OPHTHALMIC

## 2023-01-23 MED ORDER — STERILE WATER FOR IRRIGATION IR SOLN
Status: DC | PRN
  Administered 2023-01-23: 250 mL

## 2023-01-23 MED ORDER — BSS IO SOLN
INTRAOCULAR | Status: DC | PRN
  Administered 2023-01-23: 15 mL via INTRAOCULAR

## 2023-01-23 MED ORDER — SODIUM HYALURONATE 10 MG/ML IO SOLUTION
PREFILLED_SYRINGE | INTRAOCULAR | Status: DC | PRN
  Administered 2023-01-23: .85 mL via INTRAOCULAR

## 2023-01-23 MED ORDER — PHENYLEPHRINE HCL 2.5 % OP SOLN
1.0000 [drp] | OPHTHALMIC | Status: AC | PRN
Start: 2023-01-23 — End: 2023-01-23
  Administered 2023-01-23 (×3): 1 [drp] via OPHTHALMIC

## 2023-01-23 MED ORDER — POVIDONE-IODINE 5 % OP SOLN
OPHTHALMIC | Status: DC | PRN
  Administered 2023-01-23: 1 via OPHTHALMIC

## 2023-01-23 MED ORDER — SODIUM CHLORIDE 0.9% FLUSH: 3.0000 mL | Freq: Two times a day (BID) | INTRAVENOUS | Status: DC

## 2023-01-23 MED ORDER — ORAL CARE MOUTH RINSE: 15.0000 mL | Freq: Once | OROMUCOSAL | Status: DC

## 2023-01-23 MED ORDER — EPINEPHRINE PF 1 MG/ML IJ SOLN
INTRAOCULAR | Status: DC | PRN
  Administered 2023-01-23: 500 mL

## 2023-01-23 MED ORDER — LIDOCAINE HCL (PF) 1 % IJ SOLN
INTRAOCULAR | Status: DC | PRN
  Administered 2023-01-23: 1 mL via OPHTHALMIC

## 2023-01-23 MED ORDER — CHLORHEXIDINE GLUCONATE 0.12 % MT SOLN: 15.0000 mL | Freq: Once | OROMUCOSAL | Status: DC

## 2023-01-23 SURGICAL SUPPLY — 13 items
CATARACT SUITE SIGHTPATH (MISCELLANEOUS) ×1 IMPLANT
CLOTH BEACON ORANGE TIMEOUT ST (SAFETY) ×1 IMPLANT
EYE SHIELD UNIVERSAL CLEAR (GAUZE/BANDAGES/DRESSINGS) IMPLANT
FEE CATARACT SUITE SIGHTPATH (MISCELLANEOUS) ×1 IMPLANT
GLOVE BIOGEL PI IND STRL 7.0 (GLOVE) ×2 IMPLANT
LENS IOL TECNIS EYHANCE 20.5 (Intraocular Lens) IMPLANT
NDL HYPO 18GX1.5 BLUNT FILL (NEEDLE) ×1 IMPLANT
NEEDLE HYPO 18GX1.5 BLUNT FILL (NEEDLE) ×1 IMPLANT
PAD ARMBOARD 7.5X6 YLW CONV (MISCELLANEOUS) ×1 IMPLANT
POSITIONER HEAD 8X9X4 ADT (SOFTGOODS) ×1 IMPLANT
SYR TB 1ML LL NO SAFETY (SYRINGE) ×1 IMPLANT
TAPE SURG TRANSPORE 1 IN (GAUZE/BANDAGES/DRESSINGS) IMPLANT
WATER STERILE IRR 250ML POUR (IV SOLUTION) ×1 IMPLANT

## 2023-01-23 NOTE — Transfer of Care (Addendum)
Immediate Anesthesia Transfer of Care Note  Patient: Michael [name]  Procedure(s) Performed: CATARACT EXTRACTION PHACO AND INTRAOCULAR LENS PLACEMENT (IOC) (Left: Eye)  Patient Location: Short Stay  Anesthesia Type:MAC  Level of Consciousness: awake and patient cooperative  Airway & Oxygen Therapy: Patient Spontanous Breathing  Post-op Assessment: Report given to RN and Post -op Vital signs reviewed and stable  Post vital signs: Reviewed and stable  Last Vitals:  Vitals Value Taken Time  BP 141/87 01/23/23   0958  Temp 36.5 01/23/23   0958  Pulse 65 01/23/23   0958  Resp 14 01/23/23   0958  SpO2 99% 01/23/23   0958    Last Pain:  Vitals:   01/23/23 0900  TempSrc: Oral  PainSc: 0-No pain         Complications: No notable events documented.

## 2023-01-23 NOTE — Patient Outreach (Addendum)
  Care Coordination   Follow Up Visit Note   01/23/2023 Name: Michael [name] MRN: [id number] DOB: 15-Aug-1955  Michael [name] is a 67 y.o. year old male who sees [name], [name], MD for primary care. I spoke with  Rene Kocher, caregiver for Michael [name] by phone today.  What matters to the patients health and wellness today?  01/23/23 admitted for a left eye cataract extraction & intraocular lens placement by Dr June Leap Mrs Michael [name] reports other wise Mr Michael [name] is doing well without any other medical worsening symptoms  She voiced understanding as RN CM is not able to access pcp [facility] program office notes and appointments via EPIC She had inquired about the next pcp office visit.    Confirmed pill packaging started  She agrees to follow up in 2025   Goals Addressed             This Visit's Progress    Manage post cataract, CHF, COPD & hypertenson home care  -nurse care coordination services   On track    Care Coordination Goals:  Patient will continue Home Health services with [facility] Patient/family will work with palliative care. Order placed while inpatient.  Patient will work with [facility]/ Care Management Dept regarding LTC placement once discharged Patient will continue to work with care coordination SW as needed Patient will begin to receive medicines via pill packaging from [place]- confirmed now have pill packaging [employer] 01/23/23-  Interventions Today    Flowsheet Row Most Recent Value  Chronic Disease   Chronic disease during today's visit Other  [having cataract extraction today]  General Interventions   General Interventions Discussed/Reviewed General Interventions Reviewed, Annual Eye Exam, Sick Day Rules, Doctor Visits  Doctor Visits Discussed/Reviewed Doctor Visits Reviewed, Specialist, PCP  PCP/Specialist Visits Compliance with follow-up visit  [RN CM attempt to access pcp chart not successful Unable to see chart]   Education Interventions   Education Provided Provided Web-based Education  [post cataract home care]  Provided Verbal Education On Other, Eye Care  Mental Health Interventions   Mental Health Discussed/Reviewed Mental Health Reviewed, Coping Strategies  Latina Craver active with [facility] SW]  Pharmacy Interventions   Pharmacy Dicussed/Reviewed Pharmacy Topics Reviewed, Affording Medications  [confirmed they now have medication pill packaging via [facility]]              SDOH assessments and interventions completed:  No     Care Coordination Interventions:  Yes, provided   Follow up plan: Follow up call scheduled for 02/23/23    Encounter Outcome:  Patient Visit Completed   Michael Bradford L. Michael Penner, RN, BSN, [facility]  [facility] Care Management Coordinator  [phone]  Fax: [phone]

## 2023-01-23 NOTE — Patient Instructions (Addendum)
Visit Information  Thank you for taking time to visit with me today. Please don't hesitate to contact me if I can be of assistance to you.   Following are the goals we discussed today:   Goals Addressed             This Visit's Progress    Manage post cataract, CHF, COPD & hypertenson home care  -nurse care coordination services   On track    Care Coordination Goals:  Patient will continue Home Health services with [facility] Patient/family will work with palliative care. Order placed while inpatient.  Patient will work with [facility]/ Care Management Dept regarding LTC placement once discharged Patient will continue to work with care coordination SW as needed Patient will begin to receive medicines via pill packaging from [place]- confirmed now have pill packaging [employer] 01/23/23-  Interventions Today    Flowsheet Row Most Recent Value  Chronic Disease   Chronic disease during today's visit Other  [having cataract extraction today]  General Interventions   General Interventions Discussed/Reviewed General Interventions Reviewed, Annual Eye Exam, Sick Day Rules, Doctor Visits  Doctor Visits Discussed/Reviewed Doctor Visits Reviewed, Specialist, PCP  PCP/Specialist Visits Compliance with follow-up visit  [RN CM attempt to access pcp chart not successful Unable to see chart]  Education Interventions   Education Provided Provided Web-based Education  [post cataract home care]  Provided Verbal Education On Other, Eye Care  Mental Health Interventions   Mental Health Discussed/Reviewed Mental Health Reviewed, Coping Strategies  Latina Craver active with [facility] SW]  Pharmacy Interventions   Pharmacy Dicussed/Reviewed Pharmacy Topics Reviewed, Affording Medications  [confirmed they now have medication pill packaging via [facility]]              Our next appointment is by telephone on 02/23/23 at 1030  Please call the care guide team at [phone]  if you need to cancel or reschedule your appointment.   If you are experiencing a Mental Health or Behavioral Health Crisis or need someone to talk to, please call the Suicide and Crisis Lifeline: 988 call the [place] National Suicide Prevention Lifeline: 507-572-7186 or TTY: 724-460-5169 TTY 681 567 4993) to talk to a trained counselor call 1-800-273-TALK (toll free, 24 hour hotline) call the [facility]: (501)094-8273 call 911    No computer access, no preference for copy of AVS    The patient has been provided with contact information for the care management team and has been advised to call with any health related questions or concerns.   Le Ferraz L. Noelle Penner, RN, BSN, [facility]  [facility] Care Management Coordinator  [phone]  Fax: [phone]

## 2023-01-23 NOTE — Op Note (Signed)
Date of procedure: 01/23/23  Pre-operative diagnosis: Visually significant age-related combined cataract, Left Eye (H25.812)  Post-operative diagnosis: Visually significant age-related combined cataract, Left Eye (H25.812)  Procedure: Removal of cataract via phacoemulsification and insertion of intra-ocular lens Laural Benes and Johnson DIB00 +20.5D into the capsular bag of the Left Eye  Attending surgeon: Rudy Jew. Arisbel Maione, MD, MA  Anesthesia: MAC, Topical Akten  Complications: None  Estimated Blood Loss: <74mL (minimal)  Specimens: None  Implants: As above  Indications:  Visually significant age-related cataract, Left Eye  Procedure:  The patient was seen and identified in the pre-operative area. The operative eye was identified and dilated.  The operative eye was marked.  Topical anesthesia was administered to the operative eye.     The patient was then to the operative suite and placed in the supine position.  A timeout was performed confirming the patient, procedure to be performed, and all other relevant information.   The patient's face was prepped and draped in the usual fashion for intra-ocular surgery.  A lid speculum was placed into the operative eye and the surgical microscope moved into place and focused.  An inferotemporal paracentesis was created using a 20 gauge paracentesis blade.  Shugarcaine was injected into the anterior chamber.  Viscoelastic was injected into the anterior chamber.  A temporal clear-corneal main wound incision was created using a 2.60mm microkeratome.  A continuous curvilinear capsulorrhexis was initiated using an irrigating cystitome and completed using capsulorrhexis forceps.  Hydrodissection and hydrodeliniation were performed.  Viscoelastic was injected into the anterior chamber.  A phacoemulsification handpiece and a chopper as a second instrument were used to remove the nucleus and epinucleus. The irrigation/aspiration handpiece was used to remove any  remaining cortical material.   The capsular bag was reinflated with viscoelastic, checked, and found to be intact.  The intraocular lens was inserted into the capsular bag.  The irrigation/aspiration handpiece was used to remove any remaining viscoelastic.  The clear corneal wound and paracentesis wounds were then hydrated and checked with Weck-Cels to be watertight. 0.72mL of Moxfloxacin was injected into the anterior chamber. The lid-speculum was removed.  The drape was removed.  The patient's face was cleaned with a wet and dry 4x4.    A clear shield was taped over the eye. The patient was taken to the post-operative care unit in good condition, having tolerated the procedure well.  Post-Op Instructions: The patient will follow up at [facility] for a same day post-operative evaluation and will receive all other orders and instructions.

## 2023-01-23 NOTE — Anesthesia Preprocedure Evaluation (Signed)
 Anesthesia Evaluation  Patient identified by MRN, date of birth, ID band Patient awake    Reviewed: Allergy & Precautions, NPO status , Patient's Chart, lab work & pertinent test results, reviewed documented beta blocker date and time   Airway Mallampati: III  TM Distance: >3 FB Neck ROM: Full  Mouth opening: Limited Mouth Opening Comment: Head and neck cancer (HCC) 2019 Right facial basal cell carcinoma s/p resection with right partial mastectomy and partal rhinectomy with skin graft (06/13/17)  Dental  (+) Missing, Loose, Edentulous Upper, Dental Advisory Given,    Pulmonary pneumonia, COPD,  COPD inhaler, Patient abstained from smoking., former smoker Head and neck cancer (HCC) 2019 Right facial basal cell carcinoma s/p resection with right partial mastectomy and partal rhinectomy with skin graft (06/13/17)    Pulmonary exam normal breath sounds clear to auscultation       Cardiovascular Exercise Tolerance: Good hypertension, Pt. on home beta blockers and Pt. on medications + CAD, + Past MI, + Peripheral Vascular Disease (carotid revascularization) and +CHF  Normal cardiovascular exam+ dysrhythmias + Valvular Problems/Murmurs  Rhythm:Irregular Rate:Normal  2019- stress test Narrative & Impression Findings consistent with prior inferior/septal myocardial infarction with mild peri-infarct ischemia. This is a high risk study. Risk primarily based on decreased LVEF. There is a prior infarct with only mild current ischemia  Last echo EF 25%    Neuro/Psych negative neurological ROS  negative psych ROS   GI/Hepatic negative GI ROS, Neg liver ROS,,,  Endo/Other  negative endocrine ROS    Renal/GU CRFRenal diseaseStage 3 CKD  negative genitourinary   Musculoskeletal negative musculoskeletal ROS (+)    Abdominal   Peds negative pediatric ROS (+)  Hematology negative hematology ROS (+) Blood dyscrasia, anemia   Anesthesia  Other Findings Severe disfiguration nose and face from head and neck cancer surgery  Reproductive/Obstetrics negative OB ROS                             Anesthesia Physical Anesthesia Plan  ASA: 4  Anesthesia Plan: MAC   Post-op Pain Management: Minimal or no pain anticipated   Induction: Intravenous  PONV Risk Score and Plan:   Airway Management Planned: Natural Airway and Simple Face Mask  Additional Equipment: None  Intra-op Plan:   Post-operative Plan:   Informed Consent: I have reviewed the patients History and Physical, chart, labs and discussed the procedure including the risks, benefits and alternatives for the proposed anesthesia with the patient or authorized representative who has indicated his/her understanding and acceptance.     Dental advisory given  Plan Discussed with: CRNA  Anesthesia Plan Comments:         Anesthesia Quick Evaluation

## 2023-01-23 NOTE — Interval H&P Note (Signed)
History and Physical Interval Note:  01/23/2023 9:24 AM  Michael [name]  has presented today for surgery, with the diagnosis of combined forms age related cataract, left eye.  The various methods of treatment have been discussed with the patient and family. After consideration of risks, benefits and other options for treatment, the patient has consented to  Procedure(s): CATARACT EXTRACTION PHACO AND INTRAOCULAR LENS PLACEMENT (IOC) (Left) as a surgical intervention.  The patient's history has been reviewed, patient examined, no change in status, stable for surgery.  I have reviewed the patient's chart and labs.  Questions were answered to the patient's satisfaction.     [name]

## 2023-01-23 NOTE — Discharge Instructions (Signed)
Please discharge patient when stable, will follow up today with Dr. Carnella Fryman at the [facility] office immediately following discharge.  Leave shield in place until visit.  All paperwork with discharge instructions will be given at the office.  [facility] Address:  [address]  [place], [place] [address]  

## 2023-01-24 NOTE — Anesthesia Postprocedure Evaluation (Signed)
Anesthesia Post Note  Patient: Michael [name]  Procedure(s) Performed: CATARACT EXTRACTION PHACO AND INTRAOCULAR LENS PLACEMENT (IOC) (Left: Eye)  Patient location during evaluation: Phase II Anesthesia Type: MAC Level of consciousness: awake Pain management: pain level controlled Vital Signs Assessment: post-procedure vital signs reviewed and stable Respiratory status: spontaneous breathing and respiratory function stable Cardiovascular status: blood pressure returned to baseline and stable Postop Assessment: no headache and no apparent nausea or vomiting Anesthetic complications: no Comments: Late entry   No notable events documented.   Last Vitals:  Vitals:   01/23/23 0900 01/23/23 0958  BP: 139/81 (!) 141/87  Pulse: 64 65  Resp: 16 14  Temp: 36.7 C 36.5 C  SpO2: 99% 99%    Last Pain:  Vitals:   01/23/23 0958  TempSrc: Oral  PainSc: 0-No pain                 [name]

## 2023-01-27 ENCOUNTER — Encounter (HOSPITAL_COMMUNITY): Payer: Self-pay | Admitting: Ophthalmology

## 2023-02-05 ENCOUNTER — Emergency Department (HOSPITAL_COMMUNITY): Payer: 59

## 2023-02-05 ENCOUNTER — Other Ambulatory Visit: Payer: Self-pay

## 2023-02-05 ENCOUNTER — Emergency Department (HOSPITAL_COMMUNITY)
Admission: EM | Admit: 2023-02-05 | Discharge: 2023-02-06 | Disposition: A | Discharge status: Home / Self Care | Payer: 59 | Attending: Emergency Medicine | Admitting: Emergency Medicine

## 2023-02-05 DIAGNOSIS — R000 Tachycardia, unspecified: Secondary | ICD-10-CM | POA: Diagnosis not present

## 2023-02-05 DIAGNOSIS — J449 Chronic obstructive pulmonary disease, unspecified: Secondary | ICD-10-CM | POA: Diagnosis not present

## 2023-02-05 DIAGNOSIS — Z7982 Long term (current) use of aspirin: Secondary | ICD-10-CM | POA: Diagnosis not present

## 2023-02-05 DIAGNOSIS — Z7901 Long term (current) use of anticoagulants: Secondary | ICD-10-CM | POA: Insufficient documentation

## 2023-02-05 DIAGNOSIS — I517 Cardiomegaly: Secondary | ICD-10-CM | POA: Diagnosis not present

## 2023-02-05 DIAGNOSIS — R079 Chest pain, unspecified: Secondary | ICD-10-CM | POA: Diagnosis not present

## 2023-02-05 DIAGNOSIS — I499 Cardiac arrhythmia, unspecified: Secondary | ICD-10-CM | POA: Diagnosis not present

## 2023-02-05 DIAGNOSIS — I4891 Unspecified atrial fibrillation: Secondary | ICD-10-CM | POA: Diagnosis not present

## 2023-02-05 DIAGNOSIS — R0989 Other specified symptoms and signs involving the circulatory and respiratory systems: Secondary | ICD-10-CM | POA: Diagnosis not present

## 2023-02-05 DIAGNOSIS — I129 Hypertensive chronic kidney disease with stage 1 through stage 4 chronic kidney disease, or unspecified chronic kidney disease: Secondary | ICD-10-CM | POA: Insufficient documentation

## 2023-02-05 DIAGNOSIS — I213 ST elevation (STEMI) myocardial infarction of unspecified site: Secondary | ICD-10-CM | POA: Diagnosis not present

## 2023-02-05 DIAGNOSIS — N183 Chronic kidney disease, stage 3 unspecified: Secondary | ICD-10-CM | POA: Diagnosis not present

## 2023-02-05 DIAGNOSIS — Z85828 Personal history of other malignant neoplasm of skin: Secondary | ICD-10-CM | POA: Insufficient documentation

## 2023-02-05 DIAGNOSIS — R0689 Other abnormalities of breathing: Secondary | ICD-10-CM | POA: Diagnosis not present

## 2023-02-05 DIAGNOSIS — I2510 Atherosclerotic heart disease of native coronary artery without angina pectoris: Secondary | ICD-10-CM | POA: Insufficient documentation

## 2023-02-05 DIAGNOSIS — I2489 Other forms of acute ischemic heart disease: Secondary | ICD-10-CM | POA: Insufficient documentation

## 2023-02-05 DIAGNOSIS — Z79899 Other long term (current) drug therapy: Secondary | ICD-10-CM | POA: Insufficient documentation

## 2023-02-05 LAB — COMPREHENSIVE METABOLIC PANEL
ALT: 27 U/L (ref 0–44)
AST: 29 U/L (ref 15–41)
Albumin: 3.2 g/dL — ABNORMAL LOW (ref 3.5–5.0)
Alkaline Phosphatase: 151 U/L — ABNORMAL HIGH (ref 38–126)
Anion gap: 13 (ref 5–15)
BUN: 28 mg/dL — ABNORMAL HIGH (ref 8–23)
CO2: 22 mmol/L (ref 22–32)
Calcium: 9.4 mg/dL (ref 8.9–10.3)
Chloride: 104 mmol/L (ref 98–111)
Creatinine, Ser: 1.74 mg/dL — ABNORMAL HIGH (ref 0.61–1.24)
GFR, Estimated: 42 mL/min — ABNORMAL LOW (ref >60)
Glucose, Bld: 112 mg/dL — ABNORMAL HIGH (ref 70–99)
Potassium: 3.5 mmol/L (ref 3.5–5.1)
Sodium: 139 mmol/L (ref 135–145)
Total Bilirubin: 1.7 mg/dL — ABNORMAL HIGH (ref 0.0–1.2)
Total Protein: 6.7 g/dL (ref 6.5–8.1)

## 2023-02-05 LAB — CBC WITH DIFFERENTIAL/PLATELET
Abs Immature Granulocytes: 0.06 10*3/uL (ref 0.00–0.07)
Basophils Absolute: 0.1 10*3/uL (ref 0.0–0.1)
Basophils Relative: 1 %
Eosinophils Absolute: 0.1 10*3/uL (ref 0.0–0.5)
Eosinophils Relative: 1 %
HCT: 39.2 % (ref 39.0–52.0)
Hemoglobin: 12.2 g/dL — ABNORMAL LOW (ref 13.0–17.0)
Immature Granulocytes: 1 %
Lymphocytes Relative: 10 %
Lymphs Abs: 1.2 10*3/uL (ref 0.7–4.0)
MCH: 28.1 pg (ref 26.0–34.0)
MCHC: 31.1 g/dL (ref 30.0–36.0)
MCV: 90.3 fL (ref 80.0–100.0)
Monocytes Absolute: 0.9 10*3/uL (ref 0.1–1.0)
Monocytes Relative: 7 %
Neutro Abs: 9.7 10*3/uL — ABNORMAL HIGH (ref 1.7–7.7)
Neutrophils Relative %: 80 %
Platelets: 193 10*3/uL (ref 150–400)
RBC: 4.34 MIL/uL (ref 4.22–5.81)
RDW: 17.7 % — ABNORMAL HIGH (ref 11.5–15.5)
WBC: 12 10*3/uL — ABNORMAL HIGH (ref 4.0–10.5)
nRBC: 0 % (ref 0.0–0.2)

## 2023-02-05 LAB — PROTIME-INR
INR: 1.2 (ref 0.8–1.2)
Prothrombin Time: 15.8 s — ABNORMAL HIGH (ref 11.4–15.2)

## 2023-02-05 LAB — APTT: aPTT: 31 s (ref 24–36)

## 2023-02-05 LAB — MAGNESIUM: Magnesium: 2.1 mg/dL (ref 1.7–2.4)

## 2023-02-05 LAB — TROPONIN I (HIGH SENSITIVITY)
Troponin I (High Sensitivity): 109 ng/L (ref <18)
Troponin I (High Sensitivity): 126 ng/L (ref <18)

## 2023-02-05 MED ORDER — METOPROLOL TARTRATE 5 MG/5ML IV SOLN
5.0000 mg | Freq: Once | INTRAVENOUS | Status: AC
  Administered 2023-02-05: 5 mg via INTRAVENOUS
  Filled 2023-02-05: qty 5

## 2023-02-05 MED ORDER — APIXABAN 5 MG PO TABS
5.0000 mg | ORAL_TABLET | Freq: Once | ORAL | Status: AC
  Administered 2023-02-06: 5 mg via ORAL
  Filled 2023-02-05: qty 1

## 2023-02-05 MED ORDER — APIXABAN 5 MG PO TABS: 5.0000 mg | ORAL_TABLET | Freq: Two times a day (BID) | ORAL | 1 refills | Status: DC

## 2023-02-05 MED ORDER — DILTIAZEM HCL 30 MG PO TABS: 30.0000 mg | ORAL_TABLET | Freq: Once | ORAL | 1 refills | Status: DC | PRN

## 2023-02-05 MED ORDER — METOPROLOL TARTRATE 25 MG PO TABS
25.0000 mg | ORAL_TABLET | Freq: Four times a day (QID) | ORAL | Status: DC
  Administered 2023-02-05: 25 mg via ORAL
  Filled 2023-02-05: qty 1

## 2023-02-05 MED ORDER — NITROGLYCERIN 0.4 MG SL SUBL: 0.4000 mg | SUBLINGUAL_TABLET | SUBLINGUAL | 0 refills | Status: DC | PRN

## 2023-02-05 MED ORDER — RIVAROXABAN (XARELTO) EDUCATION KIT FOR AFIB PATIENTS: PACK | Freq: Once | Status: DC

## 2023-02-05 NOTE — ED Provider Notes (Signed)
 [place] EMERGENCY DEPARTMENT AT [facility] Provider Note   CSN: [id number] Arrival date & time: 02/05/23  1830     History  Chief Complaint  Patient presents with   Chest Pain    Michael [name] is a 68 y.o. male with HFrEF, orthostatic hypotension with recurrent syncope, CKD stage III, coronary disease, COPD, tobacco abuse, basal cell carcinoma status post partial rhinectomy and maxillectomy 06/06/2017 at [facility], advanced colonic tubular adenoma, right carotid stenosis, hypertension, hyperlipidemia who presents via [facility] for chest pain, activated as Code STEMI, ST elevation in anterior leads. Reports central chest pain that is constant and began yesterday. Denies associated SOB, N/V, diaphoresis. Nonradiating. Does endorse some dizziness/lightheadedness.  No  numbness/tingling. EMS gave 324 mg ASA and 2 nitroglycerin  SL PTA with resolution of pain. Code STEMI was canceled on arrival by cardiologist Anner MD. Echo on 11/05/22 demonstrates LVEF 25%. EKG demonstrates also Afib w/ RVR. Doesn't take blood thinner.   Past Medical History:  Diagnosis Date   Aortic atherosclerosis (HCC) 08/03/2021   CAD (coronary artery disease) 08/03/2021   Cardiac catheterization September 2023 with RCA/RV marginal stenosis managed medically   Cardiomyopathy (HCC)    Carotid artery disease (HCC)    CKD (chronic kidney disease) stage 3, GFR 30-59 ml/min (HCC)    COPD (chronic obstructive pulmonary disease) (HCC)    Essential hypertension    Head and neck cancer (HCC) 2019   Right facial basal cell carcinoma s/p resection with right partial mastectomy and partal rhinectomy with skin graft (06/13/17)   Iron  deficiency anemia    Urinary retention        Home Medications Prior to Admission medications   Medication Sig Start Date End Date Taking? Authorizing Provider  apixaban  (ELIQUIS ) 5 MG TABS tablet Take 1 tablet (5 mg total) by mouth 2 (two) times daily. 02/05/23 04/06/23 Yes  Franklyn Sid SAILOR, MD  aspirin  EC 81 MG tablet Take 1 tablet (81 mg total) by mouth daily. Swallow whole. 11/26/22  Yes Ricky Fines, MD  atorvastatin  (LIPITOR ) 40 MG tablet Take 1 tablet (40 mg total) by mouth every evening. Patient taking differently: Take 40 mg by mouth daily. 08/01/22 02/05/24 Yes Shahmehdi, Adriana LABOR, MD  Budeson-Glycopyrrol-Formoterol  (BREZTRI  AEROSPHERE) 160-9-4.8 MCG/ACT AERO Inhale 2 puffs into the lungs 2 (two) times daily.   Yes [provider]  carvedilol  (COREG ) 12.5 MG tablet Take 1 tablet (12.5 mg total) by mouth 2 (two) times daily with a meal. 11/25/22  Yes Ricky Fines, MD  clopidogrel  (PLAVIX ) 75 MG tablet Take 75 mg by mouth daily. 12/23/22  Yes [provider]  diltiazem  (CARDIZEM ) 30 MG tablet Take 1 tablet (30 mg total) by mouth once as needed for up to 30 doses (If heart rate consistently >110 bpm). 02/05/23  Yes Franklyn Sid SAILOR, MD  feeding supplement (ENSURE ENLIVE / ENSURE PLUS) LIQD Take 237 mLs by mouth 3 (three) times daily between meals. 11/25/22  Yes Ricky Fines, MD  ILEVRO  0.3 % ophthalmic suspension 1 drop in the morning and at bedtime. 12/12/22  Yes [provider]  losartan  (COZAAR ) 25 MG tablet Take 25 mg by mouth daily. 12/23/22  Yes [provider]  nitroGLYCERIN  (NITROSTAT ) 0.4 MG SL tablet Place 1 tablet (0.4 mg total) under the tongue every 5 (five) minutes as needed for chest pain. 02/05/23  Yes Franklyn Sid SAILOR, MD  potassium chloride  SA (KLOR-CON  M) 20 MEQ tablet Take 1 tablet (20 mEq total) by mouth daily. 07/26/22 02/05/24  Yes Shahmehdi, Seyed A, MD  prednisoLONE acetate (PRED FORTE) 1 % ophthalmic suspension Place 1 drop into both eyes in the morning and at bedtime. 12/12/22  Yes [provider]  REPATHA  SURECLICK 140 MG/ML SOAJ Inject 1 mL into the skin every 14 (fourteen) days. 10/19/22  Yes [provider]  torsemide  (DEMADEX ) 20 MG tablet Take 1 tablet (20 mg total) by mouth daily. 11/16/22   Yes Shahmehdi, Adriana LABOR, MD  albuterol  (VENTOLIN  HFA) 108 (90 Base) MCG/ACT inhaler Inhale 2 puffs into the lungs every 6 (six) hours as needed for wheezing or shortness of breath. Patient not taking: Reported on 02/05/2023 04/27/22   Willette Adriana LABOR, MD  moxifloxacin  (VIGAMOX ) 0.5 % ophthalmic solution Apply to eye. Patient not taking: Reported on 02/05/2023 12/12/22   [provider]  pantoprazole  (PROTONIX ) 40 MG tablet Take 1 tablet (40 mg total) by mouth daily. Patient not taking: Reported on 02/05/2023 11/25/22 11/25/23  Ricky Fines, MD      Allergies    Patient has no known allergies.    Review of Systems   Review of Systems A 10 point review of systems was performed and is negative unless otherwise reported in HPI.  Physical Exam Updated Vital Signs BP (!) 160/102   Pulse (!) 107   Temp 97.6 F (36.4 C) (Oral)   Resp 20   Ht 6' (1.829 m)   Wt 66.2 kg   SpO2 94%   BMI 19.80 kg/m  Physical Exam General: Normal appearing male, lying in bed.  HEENT: PERRLA, Sclera anicteric, MMM, trachea midline. S/p partial rhinectomy. Cardiology: Irregularly irregular tachycardic rhythm, no murmurs/rubs/gallops. BL radial and DP pulses equal bilaterally.  Resp: Normal respiratory rate and effort. CTAB, no wheezes, rhonchi, crackles.  Abd: Soft, non-tender, non-distended. No rebound tenderness or guarding.  GU: Deferred. MSK: No peripheral edema or signs of trauma. Extremities without deformity or TTP. No cyanosis or clubbing. Skin: warm, dry. Neuro: A&Ox4, CNs II-XII grossly intact. MAEs. Sensation grossly intact.  Psych: Normal mood and affect.   ED Results / Procedures / Treatments   Labs (all labs ordered are listed, but only abnormal results are displayed) Labs Reviewed  CBC WITH DIFFERENTIAL/PLATELET - Abnormal; Notable for the following components:      Result Value   WBC 12.0 (*)    Hemoglobin 12.2 (*)    RDW 17.7 (*)    Neutro Abs 9.7 (*)    All other components  within normal limits  COMPREHENSIVE METABOLIC PANEL - Abnormal; Notable for the following components:   Glucose, Bld 112 (*)    BUN 28 (*)    Creatinine, Ser 1.74 (*)    Albumin  3.2 (*)    Alkaline Phosphatase 151 (*)    Total Bilirubin 1.7 (*)    GFR, Estimated 42 (*)    All other components within normal limits  PROTIME-INR - Abnormal; Notable for the following components:   Prothrombin Time 15.8 (*)    All other components within normal limits  TROPONIN I (HIGH SENSITIVITY) - Abnormal; Notable for the following components:   Troponin I (High Sensitivity) 109 (*)    All other components within normal limits  TROPONIN I (HIGH SENSITIVITY) - Abnormal; Notable for the following components:   Troponin I (High Sensitivity) 126 (*)    All other components within normal limits  MAGNESIUM   APTT    EKG EKG Interpretation Date/Time:  Sunday February 05 2023 18:35:48 EST Ventricular Rate:  121 PR Interval:  QRS Duration:  131 QT Interval:  357 QTC Calculation: 507 R Axis:   133  Text Interpretation: Atrial fibrillation w/ rapid ventricular response TWIs in lateral leads Ventricular premature complex Nonspecific intraventricular conduction delay Baseline wander/artifact precludes further interpretation Confirmed by Franklyn Gills [phone]) on 02/05/2023 6:56:43 PM  Radiology CXR: Cardiomegaly with pulmonary vascular congestion.  Procedures .Critical Care  Performed by: Franklyn Gills SAILOR, MD Authorized by: Franklyn Gills SAILOR, MD   Critical care provider statement:    Critical care time (minutes):  35   Critical care was necessary to treat or prevent imminent or life-threatening deterioration of the following conditions:  Cardiac failure   Critical care was time spent personally by me on the following activities:  Development of treatment plan with patient or surrogate, discussions with consultants, evaluation of patient's response to treatment, examination of patient, ordering and review  of laboratory studies, ordering and review of radiographic studies, ordering and performing treatments and interventions, pulse oximetry, re-evaluation of patient's condition, review of old charts and obtaining history from patient or surrogate     Medications Ordered in ED Medications  metoprolol  tartrate (LOPRESSOR ) injection 5 mg (5 mg Intravenous Given 02/05/23 2120)  metoprolol  tartrate (LOPRESSOR ) injection 5 mg (5 mg Intravenous Given 02/05/23 2151)  apixaban  (ELIQUIS ) tablet 5 mg (5 mg Oral Given 02/06/23 0003)    ED Course/ Medical Decision Making/ A&P                          Medical Decision Making Amount and/or Complexity of Data Reviewed Labs: ordered. Decision-making details documented in ED Course. Radiology: ordered. Decision-making details documented in ED Course.  Risk Prescription drug management.    This patient presents to the ED for concern of STEMI called in field, cancelled by cardiology, new Afib w/ RVR, this involves an extensive number of treatment options, and is a complaint that carries with it a high risk of complications and morbidity.  I considered the following differential and admission for this acute, potentially life threatening condition.   MDM:    DDX for chest pain includes but is not limited to:  Patient with STEMI called in field that was canceled by cardiology on arrival.  Patient is found to have new A-fib with RVR with no prior history noted in the chart and patient is largely unaware of his history or current medications.  He had complained of chest pain with EMS however this resolved with nitroglycerin .  Consider unstable angina versus stable angina and cardiology is on board, will trend troponin.  No radiation of pain through to the back and no pulse deficits in extremities, lower concern for aortic dissection.  No signs of pericarditis on EKG.  No pneumonia or pneumothorax noted on chest x-ray. Patient cannot PERC out based on tachycardia and  age, but minimal risk factors for PE.  Patient does not take a blood thinner and he does no longer have any chest pain.  Vital signs are stable.  Do not believe he needs emergent cardioversion from his A-fib here in the ED.  Patient will instead be managed medically in conjunction with cardiology recommendations, appreciate their help in the care of this patient.  Clinical Course as of 02/10/23 0915  [place] Feb 05, 2023  8141 Code STEMI cancelled by cards [HN]  1950 Troponin I (High Sensitivity)(!!): 109 Similar to prior 103. Will trend. [HN]  2002 Pulse Rate: 72 Improving rate after metoprolol  [HN]  2009 DG Chest  Portable 1 View Cardiomegaly with pulmonary vascular congestion. [HN]  2023 Continues to be chest pain-free [HN]  2204 Troponin I (High Sensitivity)(!!): 126 109 --> 126. Will c/w cards. [HN]  2211 Discussed with cardiology who states that this is likely demand ischemia in the setting of A-fib with RVR. He has no recurrent chest pain and feels well. HR in 80s-90s afib.  Recommends discharge home with metoprolol  succinate 25 mg twice daily and Eliquis  5 mg daily.  Will schedule follow-up with cardiology this week. [HN]  2315 Carvedilol  is listed on patient's medication list as well as Plavix .  Patient does not know what he is taking.  Discussed with pharmacy.  Reportedly patient not supposed to be I taking his Plavix  anymore.  Patient has filled his carvedilol  and Plavix  both as recently as 01/24/2023. [HN]  2318 D/w Dr. Agapito. If he has capability to check his HR at home we can do pill-in-pocket carvedilol . [HN]  2323 Patient will stop taking baby aspirin , which is not in his pill pack. Will start apixaban , keep carvedilol , SL nitros for chest pain. Will measure HR with BP cuff if having chest pain and will take diltiazem  30 mg if HR over 110 bpm.  [HN]    Clinical Course User Index [HN] Franklyn Sid SAILOR, MD    Labs: I Ordered, and personally interpreted labs.  The pertinent results  include:  those listed above  Imaging Studies ordered: I ordered imaging studies including CXR I independently visualized and interpreted imaging. I agree with the radiologist interpretation  Additional history obtained from chart review, EMS.   Cardiac Monitoring: The patient was maintained on a cardiac monitor.  I personally viewed and interpreted the cardiac monitored which showed an underlying rhythm of: Afib w/ RVR  Reevaluation: After the interventions noted above, I reevaluated the patient and found that they have :improved  Social Determinants of Health: Lives independently  Disposition: Patient will be discharged with instructions to stop taking baby aspirin , continue his carvedilol , begin apixaban , and to also start diltiazem  30 mg for tachycardia as needed and sublingual nitros for chest pain as needed outpatient.  Myself and cardiology discussed extensively these recommendations with the patient, as patient seems to have difficulty understanding his medication regimen and states that he just takes what ever is given to him from the pharmacy.  Patient is referred for close outpatient follow-up in A-fib clinic.  I considered admission for this patient but believe he is stable for discharge.  He has no recurrence of his chest pain for his greater than 6-hour stay in the emergency department.  DC w/ discharge instructions/return precautions. All questions answered to patient's satisfaction.    Co morbidities that complicate the patient evaluation  Past Medical History:  Diagnosis Date   Aortic atherosclerosis (HCC) 08/03/2021   CAD (coronary artery disease) 08/03/2021   Cardiac catheterization September 2023 with RCA/RV marginal stenosis managed medically   Cardiomyopathy (HCC)    Carotid artery disease (HCC)    CKD (chronic kidney disease) stage 3, GFR 30-59 ml/min (HCC)    COPD (chronic obstructive pulmonary disease) (HCC)    Essential hypertension    Head and neck cancer  (HCC) 2019   Right facial basal cell carcinoma s/p resection with right partial mastectomy and partal rhinectomy with skin graft (06/13/17)   Iron  deficiency anemia    Urinary retention      Medicines Meds ordered this encounter  Medications   metoprolol  tartrate (LOPRESSOR ) injection 5 mg   metoprolol  tartrate (  LOPRESSOR ) injection 5 mg   DISCONTD: metoprolol  tartrate (LOPRESSOR ) tablet 25 mg   apixaban  (ELIQUIS ) 5 MG TABS tablet    Sig: Take 1 tablet (5 mg total) by mouth 2 (two) times daily.    Dispense:  60 tablet    Refill:  1   nitroGLYCERIN  (NITROSTAT ) 0.4 MG SL tablet    Sig: Place 1 tablet (0.4 mg total) under the tongue every 5 (five) minutes as needed for chest pain.    Dispense:  30 tablet    Refill:  0   diltiazem  (CARDIZEM ) 30 MG tablet    Sig: Take 1 tablet (30 mg total) by mouth once as needed for up to 30 doses (If heart rate consistently >110 bpm).    Dispense:  30 tablet    Refill:  1   apixaban  (ELIQUIS ) tablet 5 mg   DISCONTD: rivaroxaban  (XARELTO ) Education Kit for Afib patients    I have reviewed the patients home medicines and have made adjustments as needed  Problem List / ED Course: Problem List Items Addressed This Visit       Cardiovascular and Mediastinum   Atrial fibrillation with rapid ventricular response (HCC) - Primary   Relevant Medications   apixaban  (ELIQUIS ) 5 MG TABS tablet   nitroGLYCERIN  (NITROSTAT ) 0.4 MG SL tablet   diltiazem  (CARDIZEM ) 30 MG tablet   Other Visit Diagnoses       Demand ischemia of myocardium (HCC)       Relevant Medications   metoprolol  tartrate (LOPRESSOR ) injection 5 mg (Completed)   metoprolol  tartrate (LOPRESSOR ) injection 5 mg (Completed)   apixaban  (ELIQUIS ) 5 MG TABS tablet   nitroGLYCERIN  (NITROSTAT ) 0.4 MG SL tablet   diltiazem  (CARDIZEM ) 30 MG tablet   apixaban  (ELIQUIS ) tablet 5 mg (Completed)                   This note was created using dictation software, which may contain  spelling or grammatical errors.    Franklyn Sid SAILOR, MD 02/10/23 [phone]

## 2023-02-05 NOTE — ED Triage Notes (Signed)
 Patient arrives via [facility] for chest pain, activated as Code STEMI, ST elevation in anterior leads. Canceled by Anner MD. Pain started yesterday described as tightness. 20 LAC. 2 nitroglycerin  with relief, 324 asa prior to ems arrival. Patient endorses SOB, lightheadedness, and dizziness.   EMS vitals 172/129 99on 2 liters 104 HR 108 CBG 97.7 temp

## 2023-02-05 NOTE — Consult Note (Signed)
 Cardiology Consultation:   Patient ID: Michael [name] MRN: [id number]; DOB: [birth year]  Admit date: 02/05/2023 Date of Consult: 02/05/2023  Primary Care Provider: Shona Norleen PEDLAR, MD [facility] Cardiologist: Jayson Sierras, MD  [facility] Electrophysiologist:  None    Patient Profile:   Michael [name] is a 68 y.o. male with a  HFrEF (EF 30% in 10/2021, at 35-40% in 04/2022, 25-30% in 07/2022 and 25% in 11/2022), CAD (cath in 10/2021 showing mild to moderate obstructive CAD along mid-RCA and acute Mrg with medical management recommended), carotid artery stenosis (s/p right TCAR in 01/2021), iron  deficiency anemia, HTN (complicated by orthostatic hypotension), HLD, COPD, Stage 3 CKD tobacco abuse, basal cell carcinoma status post partial rhinectomy and maxillectomy 06/06/2017 at [facility], advanced colonic tubular adenoma, right carotid stenosis, hypertension, hyperlipidemia  who is being seen today for the evaluation of chest pain at the request of the emergency department.   History of Present Illness:   Michael [name] reports having chest tightness since yesterday described as achiness, radiating from his right to left chest.  It started yesterday while sleeping, and woke him up. Denies associated n/v, palpitations, new sob (has chronic old shortness of breath), diaphoresis, or radiaton.  Denies new lower extremity edema also with some dizziness/lightheadedness.  He has not been having chest pain with exertion.  He has not been having chest pain prior to yesterday.  He called EMS. Was given 2 SL nitroglycerin  with some relief, along with 324 of ASA prior to EMS arrival. With EMS, there were ST elevations in anterior leads, and thus code STEMI was called.  Patient evaluated by myself and Dr Anner in ED. No longer having Chest Pain. ECG showing atrial fibrillation with RVR, and no ST elevations. Unchanged t-wave inversions are old.  Patient denies history of atrial fibrillation.  Not on  anticoagulation.  No prior diagnosis of atrial fibrillation on chart search. He is not on oral anticoagulation.  Saw cardiology 11/23/22 for syncope; previously thought to be ?from orthostasis. Patient had lost his Ziopatch.  In discussion with pharmacy, his pill pack was dispensed in December with carvedilol  and clopidogrel . The patient takes a baby aspirin  on the side.   Past Medical History:  Diagnosis Date   Aortic atherosclerosis (HCC) 08/03/2021   CAD (coronary artery disease) 08/03/2021   Cardiac catheterization September 2023 with RCA/RV marginal stenosis managed medically   Cardiomyopathy (HCC)    Carotid artery disease (HCC)    CKD (chronic kidney disease) stage 3, GFR 30-59 ml/min (HCC)    COPD (chronic obstructive pulmonary disease) (HCC)    Essential hypertension    Head and neck cancer (HCC) 2019   Right facial basal cell carcinoma s/p resection with right partial mastectomy and partal rhinectomy with skin graft (06/13/17)   Iron  deficiency anemia    Urinary retention     Past Surgical History:  Procedure Laterality Date   ANKLE CLOSED REDUCTION Right    open reduction   BASAL CELL CARCINOMA EXCISION  2019   at [facility]   BIOPSY  07/19/2021   Procedure: BIOPSY;  Surgeon: Cindie Carlin POUR, DO;  [facility]: [facility];  Service: Endoscopy;;   CATARACT EXTRACTION W/PHACO Left 01/23/2023   Procedure: CATARACT EXTRACTION PHACO AND INTRAOCULAR LENS PLACEMENT (IOC);  Surgeon: Harrie Agent, MD;  Location: [facility] ORS;  Service: Ophthalmology;  Laterality: Left;  CDE: 23.26   COLONOSCOPY N/A 05/31/2017   Procedure: COLONOSCOPY;  Surgeon: Shaaron Lamar HERO, MD;  Location: [facility];  Service:  Endoscopy;  Laterality: N/A;  2:45pm   COLONOSCOPY WITH PROPOFOL  N/A 07/19/2021   Procedure: COLONOSCOPY WITH PROPOFOL ;  Surgeon: Cindie Carlin POUR, DO;  [facility]: [facility];  Service: Endoscopy;  Laterality: N/A;  3:00pm, moved up to 9:00   CYSTOSCOPY N/A 10/29/2018   Procedure:  CYSTOSCOPY, CLOT EVACUATION;  Surgeon: Devere Lonni Righter, MD;  Location: [facility];  Service: Urology;  Laterality: N/A;   PARTIAL COLECTOMY Right 08/11/2021   Procedure: PARTIAL COLECTOMY, OPEN RIGHT HEMICOLECTOMY;  Surgeon: Kallie Manuelita BROCKS, MD;  Location: [facility];  Service: General;  Laterality: Right;   POLYPECTOMY  05/31/2017   Procedure: POLYPECTOMY;  Surgeon: Shaaron Lamar HERO, MD;  [facility];  Service: Endoscopy;;   RIGHT/LEFT HEART CATH AND CORONARY ANGIOGRAPHY N/A 10/22/2021   Procedure: RIGHT/LEFT HEART CATH AND CORONARY ANGIOGRAPHY;  Surgeon: Wendel Lurena POUR, MD;  Location: [facility];  Service: Cardiovascular;  Laterality: N/A;   TONSILLECTOMY     TRANSCAROTID ARTERY REVASCULARIZATION  Right 02/15/2021   Procedure: RIGHT TRANSCAROTID ARTERY REVASCULARIZATION;  Surgeon: Gretta Lonni PARAS, MD;  Location: [facility] OR;  Service: Vascular;  Laterality: Right;   ULTRASOUND GUIDANCE FOR VASCULAR ACCESS Left 02/15/2021   Procedure: ULTRASOUND GUIDANCE FOR VASCULAR ACCESS;  Surgeon: Gretta Lonni PARAS, MD;  Location: [facility] OR;  Service: Vascular;  Laterality: Left;   XI ROBOTIC ASSISTED SIMPLE PROSTATECTOMY N/A 10/29/2018   Procedure: XI ROBOTIC ASSISTED SIMPLE PROSTATECTOMY;  Surgeon: Sherrilee Belvie CROME, MD;  Location: [facility];  Service: Urology;  Laterality: N/A;     Home Medications:  Prior to Admission medications   Medication Sig Start Date End Date Taking? Authorizing Provider  albuterol  (VENTOLIN  HFA) 108 (90 Base) MCG/ACT inhaler Inhale 2 puffs into the lungs every 6 (six) hours as needed for wheezing or shortness of breath. 04/27/22   Shahmehdi, Adriana LABOR, MD  aspirin  EC 81 MG tablet Take 1 tablet (81 mg total) by mouth daily. Swallow whole. 11/26/22   Ricky Fines, MD  atorvastatin  (LIPITOR ) 40 MG tablet Take 1 tablet (40 mg total) by mouth every evening. Patient taking differently: Take 40 mg by mouth 2 (two) times daily. 08/01/22 11/22/22  ShahmehdiAdriana LABOR, MD   Budeson-Glycopyrrol-Formoterol  (BREZTRI  AEROSPHERE) 160-9-4.8 MCG/ACT AERO Inhale 2 puffs into the lungs 2 (two) times daily.    [provider]  carvedilol  (COREG ) 12.5 MG tablet Take 1 tablet (12.5 mg total) by mouth 2 (two) times daily with a meal. 11/25/22   Ricky Fines, MD  clopidogrel  (PLAVIX ) 75 MG tablet Take 75 mg by mouth daily. 12/23/22   [provider]  feeding supplement (ENSURE ENLIVE / ENSURE PLUS) LIQD Take 237 mLs by mouth 3 (three) times daily between meals. 11/25/22   Ricky Fines, MD  ILEVRO  0.3 % ophthalmic suspension SMARTSIG:In Eye(s) 12/12/22   [provider]  losartan  (COZAAR ) 25 MG tablet Take 25 mg by mouth daily. 12/23/22   [provider]  moxifloxacin  (VIGAMOX ) 0.5 % ophthalmic solution Apply to eye. 12/12/22   [provider]  Multiple Vitamin (MULTIVITAMIN WITH MINERALS) TABS tablet Take 1 tablet by mouth daily. 11/26/22   Ricky Fines, MD  pantoprazole  (PROTONIX ) 40 MG tablet Take 1 tablet (40 mg total) by mouth daily. 11/25/22 11/25/23  Ricky Fines, MD  potassium chloride  SA (KLOR-CON  M) 20 MEQ tablet Take 1 tablet (20 mEq total) by mouth daily. 07/26/22 11/22/22  Shahmehdi, Seyed A, MD  prednisoLONE acetate (PRED FORTE) 1 % ophthalmic suspension SMARTSIG:In Eye(s) 12/12/22   [provider]  predniSONE  (DELTASONE ) 20 MG tablet Take 3 tabs daily x 1 day; then 2 tablets by mouth daily x 2 days; then 1 tablet by mouth daily x 3 days; then half tablet by mouth daily x 3 days and stop prednisone . 11/25/22   Ricky Fines, MD  REPATHA  SURECLICK 140 MG/ML SOAJ Inject 1 mL into the skin every 14 (fourteen) days. 10/19/22   [provider]  torsemide  (DEMADEX ) 20 MG tablet Take 1 tablet (20 mg total) by mouth daily. 11/16/22   Willette Adriana LABOR, MD    Inpatient Medications: Scheduled Meds:  metoprolol  tartrate  25 mg Oral Q6H   Continuous Infusions:  PRN Meds:   Allergies:   No Known  Allergies  Social History:   Social History   Socioeconomic History   Marital status: Single    Spouse name: Not on file   Number of children: Not on file   Years of education: 12   Highest education level: 12th grade  Occupational History   Not on file  Tobacco Use   Smoking status: Former    Current packs/day: 0.00    Average packs/day: 0.5 packs/day for 40.0 years (20.0 ttl pk-yrs)    Types: Cigarettes    Start date: 07/02/1981    Quit date: 07/02/2021    Years since quitting: 1.5    Passive exposure: Past   Smokeless tobacco: Never   Tobacco comments:    Quit on 06/09/21  Vaping Use   Vaping status: Never Used  Substance and Sexual Activity   Alcohol use: Not Currently   Drug use: Never   Sexual activity: Not Currently  Other Topics Concern   Not on file  Social History Narrative   Not on file   Social Drivers of Health   Financial Resource Strain: Low Risk  (12/26/2022)   Overall Financial Resource Strain (CARDIA)    Difficulty of Paying Living Expenses: Not hard at all  Food Insecurity: No Food Insecurity (12/26/2022)   Hunger Vital Sign    Worried About Running Out of Food in the Last Year: Never true    Ran Out of Food in the Last Year: Never true  Transportation Needs: No Transportation Needs (12/26/2022)   PRAPARE - Administrator, Civil Service (Medical): No    Lack of Transportation (Non-Medical): No  Physical Activity: Sufficiently Active (12/26/2022)   Exercise Vital Sign    Days of Exercise per Week: 5 days    Minutes of Exercise per Session: 50 min  Stress: No Stress Concern Present (12/26/2022)   [employer] of Occupational Health - Occupational Stress Questionnaire    Feeling of Stress : Only a little  Social Connections: Moderately Integrated (12/26/2022)   Social Connection and Isolation Panel [NHANES]    Frequency of Communication with Friends and Family: More than three times a week    Frequency of Social Gatherings with  Friends and Family: More than three times a week    Attends Religious Services: More than 4 times per year    Active Member of [employer] or Organizations: Yes    Attends Banker Meetings: More than 4 times per year    Marital Status: Never married  Intimate Partner Violence: Not At Risk (12/26/2022)   Humiliation, Afraid, Rape, and Kick questionnaire    Fear of Current or Ex-Partner: No    Emotionally Abused: No    Physically Abused: No    Sexually Abused: No    Family History:  Family History  Problem Relation Age of Onset   Stroke Father    Cirrhosis Mother    Colon cancer Neg Hx      ROS:  Review of Systems: [y] = yes, [ ]  = no      General: Weight gain [ ] ; Weight loss [ ] ; Anorexia [ ] ; Fatigue [ ] ; Fever [ ] ; Chills [ ] ; Weakness [ ]    Cardiac: Chest pain/pressure [ y]; Resting SOB [ y]; Exertional SOB davis.dad ]; Orthopnea [ ] ; Pedal Edema [ ] ; Palpitations [ ] ; Syncope [ ] ; Presyncope [ ] ; Paroxysmal nocturnal dyspnea [ ]    Pulmonary: Cough [ ] ; Wheezing [ ] ; Hemoptysis [ ] ; Sputum [ ] ; Snoring [ ]    GI: Vomiting [ ] ; Dysphagia [ ] ; Melena [ ] ; Hematochezia [ ] ; Heartburn [ ] ; Abdominal pain [ ] ; Constipation [ ] ; Diarrhea [ ] ; BRBPR [ ]    GU: Hematuria [ ] ; Dysuria [ ] ; Nocturia [ ]  Vascular: Pain in legs with walking [ ] ; Pain in feet with lying flat [ ] ; Non-healing sores [ ] ; Stroke [ ] ; TIA [ ] ; Slurred speech [ ] ;   Neuro: Headaches [ ] ; Vertigo [ ] ; Seizures [ ] ; Paresthesias [ ] ;Blurred vision [ ] ; Diplopia [ ] ; Vision changes [ ]    Ortho/Skin: Arthritis [ ] ; Joint pain [ ] ; Muscle pain [ ] ; Joint swelling [ ] ; Back Pain [ ] ; Rash [ ]    Psych: Depression [ ] ; Anxiety [ ]    Heme: Bleeding problems [ ] ; Clotting disorders [ ] ; Anemia [ ]    Endocrine: Diabetes [ ] ; Thyroid  dysfunction [ ]    Physical Exam/Data:   Vitals:   02/05/23 2116 02/05/23 2124 02/05/23 2130 02/05/23 2200  BP: (!) 148/122  (!) 160/148 (!) 128/116  Pulse: 89  64 92  Resp: 17  12 16    Temp:  97.8 F (36.6 C)    TempSrc:  Oral    SpO2: 100%  98% 100%  Weight:      Height:       No intake or output data in the 24 hours ending 02/05/23 2335    02/05/2023    6:43 PM 01/23/2023    9:00 AM 01/19/2023   11:15 AM  Last 3 Weights  Weight (lbs) 146 lb 146 lb 9.7 oz 146 lb 9.7 oz  Weight (kg) 66.225 kg 66.5 kg 66.5 kg     Body mass index is 19.8 kg/m.  General:  Well nourished, well developed, in no acute distress HEENT: normal Lymph: no adenopathy Neck: no JVD Endocrine:  No thryomegaly Vascular: No carotid bruits; FA pulses 2+ bilaterally without bruits  Cardiac:  normal S1, S2; irregularly irregular; no murmur Lungs:  clear to auscultation bilaterally but distant lung sounds, no wheezing, rhonchi or rales  Abd: soft, nontender, no hepatomegaly  Ext: no edema Musculoskeletal:  No deformities, BUE and BLE strength normal and equal Skin: warm and dry  Neuro:  CNs 2-12 intact, no focal abnormalities noted Psych:  Normal affect   EKG:  The EKG was personally reviewed and demonstrates:  afib with rvr, t-wave inversions v5-v6, lvh, no st elevations, IVCD Telemetry:  Telemetry was personally reviewed and demonstrates:  atrial fibrillation  Relevant CV Studies:  ECHO 11/05/2022: IMPRESSIONS   1. No change in severely reduced EF from TTE done 08/16/22 Limited echo No  mural apical thrombus. Left ventricular ejection fraction, by estimation,  is 25%. The left ventricle demonstrates global hypokinesis. The left  ventricular  internal cavity size was   severely dilated.   2. Right ventricular systolic function is mildly reduced. The right  ventricular size is mildly enlarged.   3. The mitral valve is abnormal. Mild mitral valve regurgitation.   Laboratory Data:  High Sensitivity Troponin:   Recent Labs  Lab 02/05/23 1840 02/05/23 2119  TROPONINIHS 109* 126*     Chemistry Recent Labs  Lab 02/05/23 1840  NA 139  K 3.5  CL 104  CO2 22  GLUCOSE 112*  BUN 28*   CREATININE 1.74*  CALCIUM  9.4  GFRNONAA 42*  ANIONGAP 13    Recent Labs  Lab 02/05/23 1840  PROT 6.7  ALBUMIN  3.2*  AST 29  ALT 27  ALKPHOS 151*  BILITOT 1.7*   Hematology Recent Labs  Lab 02/05/23 1840  WBC 12.0*  RBC 4.34  HGB 12.2*  HCT 39.2  MCV 90.3  MCH 28.1  MCHC 31.1  RDW 17.7*  PLT 193   BNPNo results for input(s): BNP, PROBNP in the last 168 hours.  DDimer No results for input(s): DDIMER in the last 168 hours.  Radiology/Studies:  DG Chest Portable 1 View Result Date: 02/05/2023 CLINICAL DATA:  Chest pain EXAM: PORTABLE CHEST 1 VIEW COMPARISON:  11/21/2022 FINDINGS: Stable cardiomegaly. Pulmonary vascular congestion. No focal consolidation, pleural effusion, or pneumothorax. IMPRESSION: Cardiomegaly with pulmonary vascular congestion. Electronically Signed   By: Norman Gatlin M.D.   On: 02/05/2023 19:17      TIMI Risk Score for Unstable Angina or Non-ST Elevation MI:   The patient's TIMI risk score is 5, which indicates a 26% risk of all cause mortality, new or recurrent myocardial infarction or need for urgent revascularization in the next 14 days.   Assessment and Plan:   Myocardial Injury CAD Presenting with chest pain. Prior cath in 10/2021 showed mild to moderate obstructive CAD along the mid-RCA and acute Mrg with medical management recommended.  Troponins from 109 -> 126 (delta 17), which is <20% definition for MI per 4th universal definition of the MI. Given new onset afib with tachycardia, troponins are consistent with myocardial injury 2/2 known CAD, afib with RVR, and demand ischemia. Not ACS. Regarding OAC, to avoid triple therapy, will stop ASA 81mg , continue clopidogrel  (confirmed taking in pill packs), and continue apixaban .  -Stop home ASA 81mg  daily (avoiding triple therapy) -Continue home clopidogrel   Atrial Fibrillation with RVR New diagnosis of atrial fibrillation; patient and chart deny prior history. He does not feel  palpitations, and his only symptom is chest pain, which may be caused by demand ischemia from his elevated HR. Not on oral anticoagulation prior to this visit, only on ASA (clopidogrel  recently stopped). This patients CHA2DS2-VASc Score and unadjusted Ischemic Stroke Rate (% per year) is equal to 4.8 % stroke rate/year from a score of 4. This score calculated as 1 point each if present [CHF, HTN, DM, Vascular=MI/PAD/Aortic Plaque, Age if 65-74, or Male], score calculated as 2 points each if present [Age > 75, or Stroke/TIA/TE]. Given IV metoprolol  5mg  x2 mg in the ED, with HR <100 bpm on this.  -Start apixaban  5mg  BID on discharge -Continue home carvedilol  25mg  BID -Start diltiazem  IR 30mg  prn palpitations and HR >110 -We will arrange cardiology f/u this upcoming week   Chronic HFrEF EF 25%. No acute exacerbation today   [facility] will sign off.   Medication Recommendations:  as above Other recommendations (labs, testing, etc):  as above Follow up as an outpatient:  requested  For questions or updates, please contact [facility] Please consult [email or web address] for contact info under     Signed, Prentice FORBES Netters, MD  02/05/2023 11:35 PM

## 2023-02-05 NOTE — Discharge Instructions (Addendum)
 Thank you for coming to [facility] Emergency Department. You were seen for chest pain and were ultimately found to have atrial fibrillation with rapid ventricular rate.  For this we have prescribed Eliquis  5 mg twice per day which is a blood thinner.  This increases your risk for bleeding.  Please stop taking your aspirin .  Please follow up with your cardiologist Dr. Debera within 1 week.   We have also prescribed sublingual nitroglycerin  tablets to utilize for severe chest pain.  Please check your heart rate once a day with your blood pressure cuff.  If it is greater than 110 bpm sustained, please take a 30 mg diltiazem  pill in the pocket.  Please discuss this with your cardiologist.   Do not hesitate to return to the ED or call 911 if you experience: -Worsening symptoms -Head trauma or severe rapid onset headache -Blood in your stool or significant bleeding from anywhere -Lightheadedness, passing out -Fevers/chills -Anything else that concerns you

## 2023-02-06 ENCOUNTER — Telehealth: Payer: Self-pay | Admitting: Cardiology

## 2023-02-06 DIAGNOSIS — I4891 Unspecified atrial fibrillation: Secondary | ICD-10-CM | POA: Diagnosis not present

## 2023-02-06 NOTE — Telephone Encounter (Signed)
 Patient seen overnight for new onset atrial fibrillation and discharged home.  Can you please call patient to schedule follow-up in 1 to 2 weeks?

## 2023-02-12 ENCOUNTER — Encounter: Payer: Self-pay | Admitting: Hematology

## 2023-02-13 ENCOUNTER — Ambulatory Visit: Payer: Self-pay | Admitting: *Deleted

## 2023-02-13 NOTE — Patient Instructions (Signed)
 Visit Information  Thank you for taking time to visit with me today. Please don't hesitate to contact me if I can be of assistance to you.   Following are the goals we discussed today:   Goals Addressed               This Visit's Progress     COMPLETED: Receive Assistance Pursuing Higher Level of Care Placement Options. (pt-stated)   On track     Care Coordination Interventions:  Interventions Today    Flowsheet Row Most Recent Value  Chronic Disease   Chronic disease during today's visit Diabetes, Congestive Heart Failure (CHF), Chronic Kidney Disease/End Stage Renal Disease (ESRD), Chronic Obstructive Pulmonary Disease (COPD), Hypertension (HTN), Atrial Fibrillation (AFib), Other  [Fall Risk, Requires Assistance with Activities of Daily Living, Bilateral Cataract Removal, Unsteady Balance/Gait, History of Falls.]  General Interventions   General Interventions Discussed/Reviewed General Interventions Discussed, Labs, Vaccines, Doctor Visits, Health Screening, Annual Foot Exam, Lipid Profile, General Interventions Reviewed, Annual Eye Exam, Durable Medical Equipment (DME), Community Resources, Level of Care, Communication with  [Encouraged Routine Engagement with Care Team Members & Providers.]  Labs Hgb A1c every 3 months, Kidney Function, Hgb A1c annually  [Encouraged Routine Labwork.]  Vaccines COVID-19, Tetanus/Pertussis/Diphtheria, Flu, Pneumonia, RSV, Shingles  [Encouraged Routine Vaccinations.]  Doctor Visits Discussed/Reviewed Doctor Visits Discussed, Specialist, Doctor Visits Reviewed, Annual Wellness Visits, PCP  [Encouraged Routine Engagement with Care Team Members & Providers.]  Health Screening Bone Density, Colonoscopy, Prostate  [Encouraged Routine Health Screenings.]  Durable Medical Equipment (DME) Bed side commode, BP Cuff, Glucomoter, Environmental Consultant, Biomedical Scientist, Other  [Prescription Eyeglasses, Hand-Held [employer], Engineer, Materials in [place], Medical Laboratory Scientific Officer & Scales.]  Wheelchair Standard   PCP/Specialist Visits Compliance with follow-up visit  [Encouraged Routine Engagement with Care Team Members & Providers.]  Communication with PCP/Specialists, CHARITY FUNDRAISER, Pharmacists, Social Work  [employer] Routine Engagement with Care Team Members & Providers.]  Level of Care Adult Daycare, Air Traffic Controller, Assisted Living, Skilled Nursing Facility  [Confirmed Disinterest in Enrollment in Adult Day Care Program or Receiving Assistance Pursuing Higher Level of Care Placement Options (I.e Assisted Living Versus Skilled Nursing Facility).]  Applications Medicaid, Personal Care Services  [facility] Active [place] Access Medicaid Coverage. Confirmed Disinterest in Applying for Personal Care Services.]  Exercise Interventions   Exercise Discussed/Reviewed Exercise Discussed, Assistive device use and maintanence, Exercise Reviewed, Physical Activity, Weight Managment  [Encouraged Daily Exercise Regimen, as Tolerated.]  Physical Activity Discussed/Reviewed Physical Activity Discussed, Home Exercise Program (HEP), Physical Activity Reviewed, PREP, Gym, Types of exercise  [Encouraged Increased Level of Activity, Inside & Outside the Home.]  Weight Management Weight maintenance  [Encouraged Heart-Healthy, Diabetic-Friendly, Low Sodium, Reduced Fat, Renal-Friendly, Diet.]  Education Interventions   Education Provided Provided Education  [Thoroughly Reviewed Educational Material & Encouraged Continued Independent Review.]  Provided Verbal Education On Nutrition, Mental Health/Coping with Illness, When to see the doctor, Foot Care, Eye Care, Labs, Blood Sugar Monitoring, Applications, Exercise, Medication, Insurance Plans, [employer]  [employer] Implementation of Educational Material Reviewed.]  Labs Reviewed Hgb A1c  [employer] & Encouraged Daily Logging of Results to Share with Providers.]  Ship Broker, Personal Care Services  [employer] Active [employer] Coverage. Confirmed Disinterest  in Applying for Personal Care Services.]  Mental Health Interventions   Mental Health Discussed/Reviewed Mental Health Discussed, Anxiety, Depression, Mental Health Reviewed, Grief and Loss, Substance Abuse, Coping Strategies, Suicide, Crisis, Other  [Assessed Mental Health & Cognitive Status.]  Nutrition Interventions   Nutrition Discussed/Reviewed Nutrition Discussed, Adding fruits and vegetables, Increasing  proteins, Decreasing fats, Nutrition Reviewed, Fluid intake, Carbohydrate meal planning, Portion sizes, Decreasing salt, Supplemental nutrition, Decreasing sugar intake  [Encouraged Healthy Diet & Supplemental Nutrition.]  Pharmacy Interventions   Pharmacy Dicussed/Reviewed Pharmacy Topics Discussed, Medications and their functions, Medication Adherence, Pharmacy Topics Reviewed, Affording Medications  [Confirmed Pill Packaging through [employer]. Confirmed Ability to Afford Prescription Medications.]  Medication Adherence --  [Confirmed Compliance with Prescription Medications.]  Safety Interventions   Safety Discussed/Reviewed Safety Discussed, Safety Reviewed, Fall Risk, Home Safety  [Encouraged Consideration of Home Safety Evaluation.]  Journalist, Newspaper, Refer for community resources  [Encouraged Routine Use of Assistive Devices & Durable Medical Equipment.]  Advanced Directive Interventions   Advanced Directives Discussed/Reviewed Advanced Directives Discussed, Advanced Directives Reviewed  [Confirmed Initiation of Advanced Directives (Living Will & Healthcare Power of Corporate Treasurer), Requesting Copies to Scan into Electronic Medical Record in Epic.]  End of Life Palliative  [facility] Care Services Provided through [facility].]       Active Listening & Reflection Utilized.  Verbalization of Feelings Encouraged.  Emotional Support Provided. Acceptance & Commitment Therapy Implemented. Cognitive Behavioral Therapy Initiated. Client-Centered  Therapy Indicated. Confirmed Patient's Permanent Place of Residence with Friend's/Landlord's, Debby & Angeline Popper. Encouraged Engagement with Philippe Desanctis, Licensed Clinical Social Worker with [facility] [place] [phone]), if You Have Questions, Need Assistance, or If Additional Social Work Needs Are Identified in The Near Future.       Please call the care guide team at [phone] if you need to cancel or reschedule your appointment.   If you are experiencing a Mental Health or Behavioral Health Crisis or need someone to talk to, please call the Suicide and Crisis Lifeline: 988 call the [place]  National Suicide Prevention Lifeline: 952-712-3396 or TTY: 603-519-9192 TTY 360 539 6798) to talk to a trained counselor call 1-800-273-TALK (toll free, 24 hour hotline) go to [facility] [address] [place] [phone]) call the [facility] Crisis Line: (574) [phone] call 911  Patient verbalizes understanding of instructions and care plan provided today and agrees to view in MyChart. Active MyChart status and patient understanding of how to access instructions and care plan via MyChart confirmed with patient.     No further follow up required.  Philippe Desanctis, BSW, MSW, Printmaker Social Work Case Set Designer Health  [facility], Population Health Direct Dial: [phone]  Fax: [phone] Email: [email or web address]: [email or web address]

## 2023-02-13 NOTE — Patient Outreach (Signed)
 Care Coordination   Follow Up Visit Note   02/13/2023  Name: Michael [name] MRN: [id number] DOB: 04-Feb-1955  Michael [name] is a 68 y.o. year old male who sees Shona, Norleen PEDLAR, MD for primary care. I spoke with Deward Alm Gamble and friend/landlord, Debby Popper by phone today.  What matters to the patients health and wellness today?  Receive Assistance Pursuing Higher Level of Care Placement Options.   Goals Addressed               This Visit's Progress     COMPLETED: Receive Assistance Pursuing Higher Level of Care Placement Options. (pt-stated)   On track     Care Coordination Interventions:  Interventions Today    Flowsheet Row Most Recent Value  Chronic Disease   Chronic disease during today's visit Diabetes, Congestive Heart Failure (CHF), Chronic Kidney Disease/End Stage Renal Disease (ESRD), Chronic Obstructive Pulmonary Disease (COPD), Hypertension (HTN), Atrial Fibrillation (AFib), Other  [Fall Risk, Requires Assistance with Activities of Daily Living, Bilateral Cataract Removal, Unsteady Balance/Gait, History of Falls.]  General Interventions   General Interventions Discussed/Reviewed General Interventions Discussed, Labs, Vaccines, Doctor Visits, Health Screening, Annual Foot Exam, Lipid Profile, General Interventions Reviewed, Annual Eye Exam, Durable Medical Equipment (DME), Community Resources, Level of Care, Communication with  [Encouraged Routine Engagement with Care Team Members & Providers.]  Labs Hgb A1c every 3 months, Kidney Function, Hgb A1c annually  [Encouraged Routine Labwork.]  Vaccines COVID-19, Tetanus/Pertussis/Diphtheria, Flu, Pneumonia, RSV, Shingles  [Encouraged Routine Vaccinations.]  Doctor Visits Discussed/Reviewed Doctor Visits Discussed, Specialist, Doctor Visits Reviewed, Annual Wellness Visits, PCP  [Encouraged Routine Engagement with Care Team Members & Providers.]  Health Screening Bone Density, Colonoscopy, Prostate  [Encouraged  Routine Health Screenings.]  Durable Medical Equipment (DME) Bed side commode, BP Cuff, Glucomoter, Environmental Consultant, Biomedical Scientist, Other  [Prescription Eyeglasses, Hand-Held [employer], Engineer, Materials in [place], Medical Laboratory Scientific Officer & Scales.]  Wheelchair Standard  PCP/Specialist Visits Compliance with follow-up visit  [Encouraged Routine Engagement with Care Team Members & Providers.]  Communication with PCP/Specialists, CHARITY FUNDRAISER, Pharmacists, Social Work  [employer] Routine Engagement with Care Team Members & Providers.]  Level of Care Adult Daycare, Air Traffic Controller, Assisted Living, Skilled Nursing Facility  [Confirmed Disinterest in Enrollment in Adult Day Care Program or Receiving Assistance Pursuing Higher Level of Care Placement Options (I.e Assisted Living Versus Skilled Nursing Facility).]  Applications Medicaid, Personal Care Services  [facility] Active [place] Access Medicaid Coverage. Confirmed Disinterest in Applying for Personal Care Services.]  Exercise Interventions   Exercise Discussed/Reviewed Exercise Discussed, Assistive device use and maintanence, Exercise Reviewed, Physical Activity, Weight Managment  [Encouraged Daily Exercise Regimen, as Tolerated.]  Physical Activity Discussed/Reviewed Physical Activity Discussed, Home Exercise Program (HEP), Physical Activity Reviewed, PREP, Gym, Types of exercise  [Encouraged Increased Level of Activity, Inside & Outside the Home.]  Weight Management Weight maintenance  [Encouraged Heart-Healthy, Diabetic-Friendly, Low Sodium, Reduced Fat, Renal-Friendly, Diet.]  Education Interventions   Education Provided Provided Education  [Thoroughly Reviewed Educational Material & Encouraged Continued Independent Review.]  Provided Verbal Education On Nutrition, Mental Health/Coping with Illness, When to see the doctor, Foot Care, Eye Care, Labs, Blood Sugar Monitoring, Applications, Exercise, Medication, Insurance Plans, [employer]  [employer] Implementation of Educational  Material Reviewed.]  Labs Reviewed Hgb A1c  [employer] & Encouraged Daily Logging of Results to Share with Providers.]  Ship Broker, Personal Care Services  [employer] Active [employer] Coverage. Confirmed Disinterest in Applying for Personal Care Services.]  Mental Health Interventions   Mental Health Discussed/Reviewed Mental Health  Discussed, Anxiety, Depression, Mental Health Reviewed, Grief and Loss, Substance Abuse, Coping Strategies, Suicide, Crisis, Other  [Assessed Mental Health & Cognitive Status.]  Nutrition Interventions   Nutrition Discussed/Reviewed Nutrition Discussed, Adding fruits and vegetables, Increasing proteins, Decreasing fats, Nutrition Reviewed, Fluid intake, Carbohydrate meal planning, Portion sizes, Decreasing salt, Supplemental nutrition, Decreasing sugar intake  [Encouraged Healthy Diet & Supplemental Nutrition.]  Pharmacy Interventions   Pharmacy Dicussed/Reviewed Pharmacy Topics Discussed, Medications and their functions, Medication Adherence, Pharmacy Topics Reviewed, Affording Medications  [Confirmed Pill Packaging through [employer]. Confirmed Ability to Afford Prescription Medications.]  Medication Adherence --  [Confirmed Compliance with Prescription Medications.]  Safety Interventions   Safety Discussed/Reviewed Safety Discussed, Safety Reviewed, Fall Risk, Home Safety  [Encouraged Consideration of Home Safety Evaluation.]  Journalist, Newspaper, Refer for community resources  [Encouraged Routine Use of Assistive Devices & Durable Medical Equipment.]  Advanced Directive Interventions   Advanced Directives Discussed/Reviewed Advanced Directives Discussed, Advanced Directives Reviewed  [Confirmed Initiation of Advanced Directives (Living Will & Healthcare Power of Corporate Treasurer), Requesting Copies to Scan into Electronic Medical Record in Epic.]  End of Life Palliative  [facility] Care Services Provided through [place]  [facility].]       Active Listening & Reflection Utilized.  Verbalization of Feelings Encouraged.  Emotional Support Provided. Acceptance & Commitment Therapy Implemented. Cognitive Behavioral Therapy Initiated. Client-Centered Therapy Indicated. Confirmed Patient's Permanent Place of Residence with Friend's/Landlord's, Debby & Angeline Popper. Encouraged Engagement with Philippe Desanctis, Licensed Clinical Social Worker with [facility] [phone]), if You Have Questions, Need Assistance, or If Additional Social Work Needs Are Identified in The Near Future.       SDOH assessments and interventions completed:  Yes.  Care Coordination Interventions:  Yes, provided.   Follow up plan: No further follow up required.    Encounter Outcome:  Patient Visit Completed.   Philippe Desanctis, BSW, MSW, Printmaker Social Work Case Set Designer Health  [facility], Population Health Direct Dial: [phone]  Fax: [phone] Email: [email or web address]: [email or web address]

## 2023-02-21 ENCOUNTER — Emergency Department (HOSPITAL_COMMUNITY): Payer: 59

## 2023-02-21 ENCOUNTER — Inpatient Hospital Stay (HOSPITAL_COMMUNITY)
Admission: EM | Admit: 2023-02-21 | Discharge: 2023-02-25 | DRG: 280 | Disposition: A | Discharge status: Home Health | Payer: 59 | Attending: Student | Admitting: Student

## 2023-02-21 ENCOUNTER — Encounter (HOSPITAL_COMMUNITY): Payer: Self-pay | Admitting: Emergency Medicine

## 2023-02-21 ENCOUNTER — Encounter: Payer: Self-pay | Admitting: Hematology

## 2023-02-21 ENCOUNTER — Other Ambulatory Visit: Payer: Self-pay

## 2023-02-21 DIAGNOSIS — R069 Unspecified abnormalities of breathing: Secondary | ICD-10-CM | POA: Diagnosis not present

## 2023-02-21 DIAGNOSIS — I10 Essential (primary) hypertension: Secondary | ICD-10-CM | POA: Diagnosis present

## 2023-02-21 DIAGNOSIS — I509 Heart failure, unspecified: Secondary | ICD-10-CM | POA: Diagnosis not present

## 2023-02-21 DIAGNOSIS — J90 Pleural effusion, not elsewhere classified: Secondary | ICD-10-CM | POA: Diagnosis not present

## 2023-02-21 DIAGNOSIS — D509 Iron deficiency anemia, unspecified: Secondary | ICD-10-CM | POA: Diagnosis not present

## 2023-02-21 DIAGNOSIS — D72829 Elevated white blood cell count, unspecified: Secondary | ICD-10-CM | POA: Diagnosis not present

## 2023-02-21 DIAGNOSIS — I428 Other cardiomyopathies: Secondary | ICD-10-CM | POA: Diagnosis not present

## 2023-02-21 DIAGNOSIS — Z7982 Long term (current) use of aspirin: Secondary | ICD-10-CM | POA: Diagnosis not present

## 2023-02-21 DIAGNOSIS — I130 Hypertensive heart and chronic kidney disease with heart failure and stage 1 through stage 4 chronic kidney disease, or unspecified chronic kidney disease: Secondary | ICD-10-CM | POA: Diagnosis not present

## 2023-02-21 DIAGNOSIS — Z91119 Patient's noncompliance with dietary regimen due to unspecified reason: Secondary | ICD-10-CM

## 2023-02-21 DIAGNOSIS — N1832 Chronic kidney disease, stage 3b: Secondary | ICD-10-CM | POA: Diagnosis not present

## 2023-02-21 DIAGNOSIS — I5023 Acute on chronic systolic (congestive) heart failure: Secondary | ICD-10-CM | POA: Diagnosis not present

## 2023-02-21 DIAGNOSIS — Z7901 Long term (current) use of anticoagulants: Secondary | ICD-10-CM

## 2023-02-21 DIAGNOSIS — Z743 Need for continuous supervision: Secondary | ICD-10-CM | POA: Diagnosis not present

## 2023-02-21 DIAGNOSIS — Z79899 Other long term (current) drug therapy: Secondary | ICD-10-CM

## 2023-02-21 DIAGNOSIS — Z91199 Patient's noncompliance with other medical treatment and regimen due to unspecified reason: Secondary | ICD-10-CM

## 2023-02-21 DIAGNOSIS — C44311 Basal cell carcinoma of skin of nose: Secondary | ICD-10-CM | POA: Diagnosis not present

## 2023-02-21 DIAGNOSIS — E8809 Other disorders of plasma-protein metabolism, not elsewhere classified: Secondary | ICD-10-CM | POA: Diagnosis present

## 2023-02-21 DIAGNOSIS — R0689 Other abnormalities of breathing: Secondary | ICD-10-CM | POA: Diagnosis not present

## 2023-02-21 DIAGNOSIS — Z1152 Encounter for screening for COVID-19: Secondary | ICD-10-CM | POA: Diagnosis not present

## 2023-02-21 DIAGNOSIS — I472 Ventricular tachycardia, unspecified: Secondary | ICD-10-CM | POA: Diagnosis not present

## 2023-02-21 DIAGNOSIS — I700 Atherosclerosis of aorta: Secondary | ICD-10-CM | POA: Diagnosis not present

## 2023-02-21 DIAGNOSIS — E876 Hypokalemia: Secondary | ICD-10-CM | POA: Diagnosis present

## 2023-02-21 DIAGNOSIS — I480 Paroxysmal atrial fibrillation: Secondary | ICD-10-CM | POA: Diagnosis present

## 2023-02-21 DIAGNOSIS — N183 Chronic kidney disease, stage 3 unspecified: Secondary | ICD-10-CM | POA: Diagnosis not present

## 2023-02-21 DIAGNOSIS — Z9049 Acquired absence of other specified parts of digestive tract: Secondary | ICD-10-CM

## 2023-02-21 DIAGNOSIS — R2689 Other abnormalities of gait and mobility: Secondary | ICD-10-CM

## 2023-02-21 DIAGNOSIS — I4729 Other ventricular tachycardia: Secondary | ICD-10-CM | POA: Diagnosis not present

## 2023-02-21 DIAGNOSIS — I6521 Occlusion and stenosis of right carotid artery: Secondary | ICD-10-CM | POA: Diagnosis present

## 2023-02-21 DIAGNOSIS — J449 Chronic obstructive pulmonary disease, unspecified: Secondary | ICD-10-CM | POA: Diagnosis present

## 2023-02-21 DIAGNOSIS — I110 Hypertensive heart disease with heart failure: Secondary | ICD-10-CM | POA: Diagnosis not present

## 2023-02-21 DIAGNOSIS — R7989 Other specified abnormal findings of blood chemistry: Secondary | ICD-10-CM | POA: Diagnosis not present

## 2023-02-21 DIAGNOSIS — Z87440 Personal history of urinary (tract) infections: Secondary | ICD-10-CM

## 2023-02-21 DIAGNOSIS — R55 Syncope and collapse: Secondary | ICD-10-CM | POA: Diagnosis not present

## 2023-02-21 DIAGNOSIS — Z66 Do not resuscitate: Secondary | ICD-10-CM | POA: Diagnosis not present

## 2023-02-21 DIAGNOSIS — J439 Emphysema, unspecified: Secondary | ICD-10-CM | POA: Diagnosis present

## 2023-02-21 DIAGNOSIS — R6889 Other general symptoms and signs: Secondary | ICD-10-CM | POA: Diagnosis not present

## 2023-02-21 DIAGNOSIS — Z87891 Personal history of nicotine dependence: Secondary | ICD-10-CM | POA: Diagnosis not present

## 2023-02-21 DIAGNOSIS — W19XXXA Unspecified fall, initial encounter: Secondary | ICD-10-CM | POA: Diagnosis present

## 2023-02-21 DIAGNOSIS — I252 Old myocardial infarction: Secondary | ICD-10-CM | POA: Diagnosis not present

## 2023-02-21 DIAGNOSIS — Z8589 Personal history of malignant neoplasm of other organs and systems: Secondary | ICD-10-CM

## 2023-02-21 DIAGNOSIS — I779 Disorder of arteries and arterioles, unspecified: Secondary | ICD-10-CM | POA: Diagnosis not present

## 2023-02-21 DIAGNOSIS — K8020 Calculus of gallbladder without cholecystitis without obstruction: Secondary | ICD-10-CM | POA: Diagnosis present

## 2023-02-21 DIAGNOSIS — Z823 Family history of stroke: Secondary | ICD-10-CM

## 2023-02-21 DIAGNOSIS — Z91148 Patient's other noncompliance with medication regimen for other reason: Secondary | ICD-10-CM

## 2023-02-21 DIAGNOSIS — J432 Centrilobular emphysema: Secondary | ICD-10-CM | POA: Diagnosis not present

## 2023-02-21 DIAGNOSIS — R0602 Shortness of breath: Secondary | ICD-10-CM | POA: Diagnosis not present

## 2023-02-21 DIAGNOSIS — I21A1 Myocardial infarction type 2: Secondary | ICD-10-CM | POA: Diagnosis not present

## 2023-02-21 DIAGNOSIS — I2510 Atherosclerotic heart disease of native coronary artery without angina pectoris: Secondary | ICD-10-CM | POA: Diagnosis present

## 2023-02-21 DIAGNOSIS — Z85828 Personal history of other malignant neoplasm of skin: Secondary | ICD-10-CM

## 2023-02-21 DIAGNOSIS — E785 Hyperlipidemia, unspecified: Secondary | ICD-10-CM | POA: Diagnosis present

## 2023-02-21 DIAGNOSIS — I440 Atrioventricular block, first degree: Secondary | ICD-10-CM | POA: Diagnosis present

## 2023-02-21 DIAGNOSIS — E890 Postprocedural hypothyroidism: Secondary | ICD-10-CM | POA: Diagnosis not present

## 2023-02-21 DIAGNOSIS — Z7951 Long term (current) use of inhaled steroids: Secondary | ICD-10-CM

## 2023-02-21 DIAGNOSIS — I499 Cardiac arrhythmia, unspecified: Secondary | ICD-10-CM | POA: Diagnosis not present

## 2023-02-21 DIAGNOSIS — I517 Cardiomegaly: Secondary | ICD-10-CM | POA: Diagnosis not present

## 2023-02-21 DIAGNOSIS — I493 Ventricular premature depolarization: Secondary | ICD-10-CM | POA: Diagnosis present

## 2023-02-21 LAB — CBC
HCT: 35.6 % — ABNORMAL LOW (ref 39.0–52.0)
Hemoglobin: 11 g/dL — ABNORMAL LOW (ref 13.0–17.0)
MCH: 28.4 pg (ref 26.0–34.0)
MCHC: 30.9 g/dL (ref 30.0–36.0)
MCV: 92 fL (ref 80.0–100.0)
Platelets: 400 10*3/uL (ref 150–400)
RBC: 3.87 MIL/uL — ABNORMAL LOW (ref 4.22–5.81)
RDW: 16.6 % — ABNORMAL HIGH (ref 11.5–15.5)
WBC: 11.4 10*3/uL — ABNORMAL HIGH (ref 4.0–10.5)
nRBC: 0 % (ref 0.0–0.2)

## 2023-02-21 LAB — COMPREHENSIVE METABOLIC PANEL
ALT: 18 U/L (ref 0–44)
AST: 21 U/L (ref 15–41)
Albumin: 3.1 g/dL — ABNORMAL LOW (ref 3.5–5.0)
Alkaline Phosphatase: 120 U/L (ref 38–126)
Anion gap: 6 (ref 5–15)
BUN: 22 mg/dL (ref 8–23)
CO2: 25 mmol/L (ref 22–32)
Calcium: 8.7 mg/dL — ABNORMAL LOW (ref 8.9–10.3)
Chloride: 109 mmol/L (ref 98–111)
Creatinine, Ser: 1.57 mg/dL — ABNORMAL HIGH (ref 0.61–1.24)
GFR, Estimated: 48 mL/min — ABNORMAL LOW (ref >60)
Glucose, Bld: 112 mg/dL — ABNORMAL HIGH (ref 70–99)
Potassium: 4.1 mmol/L (ref 3.5–5.1)
Sodium: 140 mmol/L (ref 135–145)
Total Bilirubin: 1.6 mg/dL — ABNORMAL HIGH (ref 0.0–1.2)
Total Protein: 6.8 g/dL (ref 6.5–8.1)

## 2023-02-21 LAB — TROPONIN I (HIGH SENSITIVITY)
Troponin I (High Sensitivity): 63 ng/L — ABNORMAL HIGH (ref <18)
Troponin I (High Sensitivity): 53 ng/L — ABNORMAL HIGH (ref <18)

## 2023-02-21 LAB — RESP PANEL BY RT-PCR (RSV, FLU A&B, COVID)  RVPGX2
Influenza A by PCR: NEGATIVE
Influenza B by PCR: NEGATIVE
Resp Syncytial Virus by PCR: NEGATIVE
SARS Coronavirus 2 by RT PCR: NEGATIVE

## 2023-02-21 LAB — BRAIN NATRIURETIC PEPTIDE: B Natriuretic Peptide: >4500 pg/mL — ABNORMAL HIGH (ref 0.0–100.0)

## 2023-02-21 MED ORDER — ACETAMINOPHEN 325 MG PO TABS: 650.0000 mg | ORAL_TABLET | Freq: Four times a day (QID) | ORAL | Status: DC | PRN

## 2023-02-21 MED ORDER — ORAL CARE MOUTH RINSE: 15.0000 mL | OROMUCOSAL | Status: DC | PRN

## 2023-02-21 MED ORDER — HYDRALAZINE HCL 20 MG/ML IJ SOLN
10.0000 mg | INTRAMUSCULAR | Status: DC | PRN
  Filled 2023-02-21: qty 1

## 2023-02-21 MED ORDER — IOHEXOL 350 MG/ML SOLN
100.0000 mL | Freq: Once | INTRAVENOUS | Status: AC | PRN
  Administered 2023-02-21: 100 mL via INTRAVENOUS

## 2023-02-21 MED ORDER — CARVEDILOL 12.5 MG PO TABS
12.5000 mg | ORAL_TABLET | Freq: Two times a day (BID) | ORAL | Status: DC
  Administered 2023-02-21 – 2023-02-25 (×8): 12.5 mg via ORAL
  Filled 2023-02-21 (×8): qty 1

## 2023-02-21 MED ORDER — NITROGLYCERIN 0.4 MG SL SUBL: 0.4000 mg | SUBLINGUAL_TABLET | SUBLINGUAL | Status: DC | PRN

## 2023-02-21 MED ORDER — SODIUM CHLORIDE 0.9% FLUSH
3.0000 mL | Freq: Two times a day (BID) | INTRAVENOUS | Status: DC
  Administered 2023-02-21 – 2023-02-25 (×8): 3 mL via INTRAVENOUS

## 2023-02-21 MED ORDER — ATORVASTATIN CALCIUM 40 MG PO TABS
40.0000 mg | ORAL_TABLET | Freq: Every evening | ORAL | Status: DC
  Administered 2023-02-21 – 2023-02-24 (×4): 40 mg via ORAL
  Filled 2023-02-21 (×4): qty 1

## 2023-02-21 MED ORDER — IPRATROPIUM-ALBUTEROL 0.5-2.5 (3) MG/3ML IN SOLN
3.0000 mL | Freq: Once | RESPIRATORY_TRACT | Status: AC
Start: 2023-02-21 — End: 2023-02-21
  Administered 2023-02-21: 3 mL via RESPIRATORY_TRACT
  Filled 2023-02-21: qty 3

## 2023-02-21 MED ORDER — FUROSEMIDE 10 MG/ML IJ SOLN: 40.0000 mg | Freq: Once | INTRAMUSCULAR | Status: DC

## 2023-02-21 MED ORDER — ACETAMINOPHEN 650 MG RE SUPP: 650.0000 mg | Freq: Four times a day (QID) | RECTAL | Status: DC | PRN

## 2023-02-21 MED ORDER — NEPAFENAC 0.3 % OP SUSP
1.0000 [drp] | Freq: Two times a day (BID) | OPHTHALMIC | Status: DC
Start: 2023-02-21 — End: 2023-02-23

## 2023-02-21 MED ORDER — ALBUTEROL SULFATE (2.5 MG/3ML) 0.083% IN NEBU
2.5000 mg | INHALATION_SOLUTION | Freq: Once | RESPIRATORY_TRACT | Status: AC
  Administered 2023-02-21: 2.5 mg via RESPIRATORY_TRACT
  Filled 2023-02-21: qty 3

## 2023-02-21 MED ORDER — BUDESON-GLYCOPYRROL-FORMOTEROL 160-9-4.8 MCG/ACT IN AERO: 2.0000 | INHALATION_SPRAY | Freq: Two times a day (BID) | RESPIRATORY_TRACT | Status: DC

## 2023-02-21 MED ORDER — LOSARTAN POTASSIUM 25 MG PO TABS
25.0000 mg | ORAL_TABLET | Freq: Once | ORAL | Status: AC
Start: 2023-02-21 — End: 2023-02-21
  Administered 2023-02-21: 25 mg via ORAL
  Filled 2023-02-21: qty 1

## 2023-02-21 MED ORDER — POTASSIUM CHLORIDE CRYS ER 20 MEQ PO TBCR
20.0000 meq | EXTENDED_RELEASE_TABLET | Freq: Every day | ORAL | Status: DC
  Administered 2023-02-21 – 2023-02-25 (×5): 20 meq via ORAL
  Filled 2023-02-21 (×5): qty 1

## 2023-02-21 MED ORDER — APIXABAN 5 MG PO TABS
5.0000 mg | ORAL_TABLET | Freq: Two times a day (BID) | ORAL | Status: DC
  Administered 2023-02-21 – 2023-02-25 (×8): 5 mg via ORAL
  Filled 2023-02-21 (×8): qty 1

## 2023-02-21 MED ORDER — FLUTICASONE FUROATE-VILANTEROL 200-25 MCG/ACT IN AEPB
1.0000 | INHALATION_SPRAY | Freq: Every day | RESPIRATORY_TRACT | Status: DC
  Administered 2023-02-22 – 2023-02-25 (×4): 1 via RESPIRATORY_TRACT
  Filled 2023-02-21: qty 28

## 2023-02-21 MED ORDER — FUROSEMIDE 10 MG/ML IJ SOLN
40.0000 mg | Freq: Once | INTRAMUSCULAR | Status: AC
  Administered 2023-02-21: 40 mg via INTRAVENOUS
  Filled 2023-02-21: qty 4

## 2023-02-21 MED ORDER — LOSARTAN POTASSIUM 25 MG PO TABS
25.0000 mg | ORAL_TABLET | Freq: Every day | ORAL | Status: DC
  Administered 2023-02-22 – 2023-02-25 (×4): 25 mg via ORAL
  Filled 2023-02-21 (×4): qty 1

## 2023-02-21 MED ORDER — UMECLIDINIUM BROMIDE 62.5 MCG/ACT IN AEPB
1.0000 | INHALATION_SPRAY | Freq: Every day | RESPIRATORY_TRACT | Status: DC
  Administered 2023-02-22 – 2023-02-25 (×4): 1 via RESPIRATORY_TRACT
  Filled 2023-02-21: qty 7

## 2023-02-21 NOTE — ED Triage Notes (Signed)
Pt brought in by EMS. ' Pt c/o shortness of breath for the past three days that has gotten progressively worse.  Pt endorses worsening shortness of breath on exertion.  Pt denies 02 at home. Pt denies chest pain, fevers, and cough at this time.   Hx COPD

## 2023-02-21 NOTE — H&P (Signed)
History and Physical    Patient: Michael [name] NWG:[id number] DOB: [birth year] DOA: 02/21/2023 DOS: the patient was seen and examined on 02/21/2023 PCP: Benita Stabile, MD  Patient coming from: Home  Chief Complaint:  Chief Complaint  Patient presents with   Shortness of Breath   HPI: Michael [name] is a 68 y.o. male with a history of chronic HFrEF, orthostatic hypotension with recurrent syncope, CKD stage III, coronary disease, COPD, tobacco abuse, basal cell carcinoma status post partial rhinectomy and maxillectomy 06/06/2017 at [facility], advanced colonic tubular adenoma, right carotid stenosis, hypertension, hyperlipidemia who presented to the ED with shortness of breath progressively worsening over the past few days. Worse with exertion and somewhat with laying flat, also notices leg swelling slightly worse than usual. Has been wheezing as well. He is taking his medications as prescribed, not sure what they are though because they're group together and put in packs by his pharmacy. Does miss doses occasionally, didn't take meds this AM. No fevers, chills, chest pain. No syncope today, but says he intermittently will fall to the ground, described as "passing out," though he denies any loss of consciousness.   In the ED he was tachypneic, said to be in mild respiratory distress, not hypoxemic. Hypertensive. BNP was >4,500, troponin 63 > 53. NSR with PVC and no ST elevations on ECG. CTA chest showed no PE, small bilateral pleural effusions, contrast reflux into IVC/hepatic veins on background of emphysema, pulmonary artery enlargement, cardiomegaly. Also cholelithiasis noted. Patient denies any abdominal pain, nausea or vomiting. Covid, flu, RSV PCRs negative. He was wheezing, so given duonebs, lasix, and admission requested. This is his 10th admission in as many months.   Review of Systems: As mentioned in the history of present illness. All other systems reviewed and are negative. Past  Medical History:  Diagnosis Date   Aortic atherosclerosis (HCC) 08/03/2021   CAD (coronary artery disease) 08/03/2021   Cardiac catheterization September 2023 with RCA/RV marginal stenosis managed medically   Cardiomyopathy (HCC)    Carotid artery disease (HCC)    CKD (chronic kidney disease) stage 3, GFR 30-59 ml/min (HCC)    COPD (chronic obstructive pulmonary disease) (HCC)    Essential hypertension    Head and neck cancer (HCC) 2019   Right facial basal cell carcinoma s/p resection with right partial mastectomy and partal rhinectomy with skin graft (06/13/17)   Iron deficiency anemia    Urinary retention    Past Surgical History:  Procedure Laterality Date   ANKLE CLOSED REDUCTION Right    open reduction   BASAL CELL CARCINOMA EXCISION  2019   at [facility]   BIOPSY  07/19/2021   Procedure: BIOPSY;  Surgeon: Lanelle Bal, DO;  Location: [facility];  Service: Endoscopy;;   CATARACT EXTRACTION W/PHACO Left 01/23/2023   Procedure: CATARACT EXTRACTION PHACO AND INTRAOCULAR LENS PLACEMENT (IOC);  Surgeon: [name], MD;  Location: [facility];  Service: Ophthalmology;  Laterality: Left;  CDE: 23.26   COLONOSCOPY N/A 05/31/2017   Procedure: COLONOSCOPY;  Surgeon: Corbin Ade, MD;  Location: [facility];  Service: Endoscopy;  Laterality: N/A;  2:45pm   COLONOSCOPY WITH PROPOFOL N/A 07/19/2021   Procedure: COLONOSCOPY WITH PROPOFOL;  Surgeon: Lanelle Bal, DO;  Location: [facility];  Service: Endoscopy;  Laterality: N/A;  3:00pm, moved up to 9:00   CYSTOSCOPY N/A 10/29/2018   Procedure: CYSTOSCOPY, CLOT EVACUATION;  Surgeon: Rene Paci, MD;  Location: [facility];  Service: Urology;  Laterality:  N/A;   PARTIAL COLECTOMY Right 08/11/2021   Procedure: PARTIAL COLECTOMY, OPEN RIGHT HEMICOLECTOMY;  Surgeon: Lucretia Roers, MD;  Location: [facility];  Service: General;  Laterality: Right;   POLYPECTOMY  05/31/2017   Procedure: POLYPECTOMY;  Surgeon: Corbin Ade, MD;   Location: [facility];  Service: Endoscopy;;   RIGHT/LEFT HEART CATH AND CORONARY ANGIOGRAPHY N/A 10/22/2021   Procedure: RIGHT/LEFT HEART CATH AND CORONARY ANGIOGRAPHY;  Surgeon: Orbie Pyo, MD;  Location: [facility];  Service: Cardiovascular;  Laterality: N/A;   TONSILLECTOMY     TRANSCAROTID ARTERY REVASCULARIZATION  Right 02/15/2021   Procedure: RIGHT TRANSCAROTID ARTERY REVASCULARIZATION;  Surgeon: Cephus Shelling, MD;  Location: [facility];  Service: Vascular;  Laterality: Right;   ULTRASOUND GUIDANCE FOR VASCULAR ACCESS Left 02/15/2021   Procedure: ULTRASOUND GUIDANCE FOR VASCULAR ACCESS;  Surgeon: Cephus Shelling, MD;  Location[facility];  Service: Vascular;  Laterality: Left;   XI ROBOTIC ASSISTED SIMPLE PROSTATECTOMY N/A 10/29/2018   Procedure: XI ROBOTIC ASSISTED SIMPLE PROSTATECTOMY;  Surgeon: Malen Gauze, MD;  Location: [facility];  Service: Urology;  Laterality: N/A;   Social History:  reports that he quit smoking about 19 months ago. His smoking use included cigarettes. He started smoking about 41 years ago. He has a 20 pack-year smoking history. He has been exposed to tobacco smoke. He has never used smokeless tobacco. He reports that he does not currently use alcohol. He reports that he does not use drugs.  No Known Allergies  Family History  Problem Relation Age of Onset   Stroke Father    Cirrhosis Mother    Colon cancer Neg Hx     Prior to Admission medications   Medication Sig Start Date End Date Taking? Authorizing Provider  albuterol (VENTOLIN HFA) 108 (90 Base) MCG/ACT inhaler Inhale 2 puffs into the lungs every 6 (six) hours as needed for wheezing or shortness of breath. 04/27/22  Yes Shahmehdi, Gemma Payor, MD  apixaban (ELIQUIS) 5 MG TABS tablet Take 1 tablet (5 mg total) by mouth 2 (two) times daily. 02/05/23 04/06/23 Yes Loetta Rough, MD  aspirin EC 81 MG tablet Take 1 tablet (81 mg total) by mouth daily. Swallow whole. 11/26/22  Yes Vassie Loll,  MD  atorvastatin (LIPITOR) 40 MG tablet Take 1 tablet (40 mg total) by mouth every evening. Patient taking differently: Take 40 mg by mouth daily. 08/01/22 02/05/24 Yes Shahmehdi, Gemma Payor, MD  Budeson-Glycopyrrol-Formoterol (BREZTRI AEROSPHERE) 160-9-4.8 MCG/ACT AERO Inhale 2 puffs into the lungs 2 (two) times daily.   Yes [provider]  carvedilol (COREG) 12.5 MG tablet Take 1 tablet (12.5 mg total) by mouth 2 (two) times daily with a meal. 11/25/22  Yes Vassie Loll, MD  diltiazem (CARDIZEM) 30 MG tablet Take 1 tablet (30 mg total) by mouth once as needed for up to 30 doses (If heart rate consistently >110 bpm). 02/05/23  Yes Loetta Rough, MD  feeding supplement (ENSURE ENLIVE / ENSURE PLUS) LIQD Take 237 mLs by mouth 3 (three) times daily between meals. Patient taking differently: Take 237 mLs by mouth daily. 11/25/22  Yes Vassie Loll, MD  ILEVRO 0.3 % ophthalmic suspension Place 1 drop into both eyes in the morning and at bedtime. 12/12/22  Yes [provider]  losartan (COZAAR) 25 MG tablet Take 25 mg by mouth daily. 12/23/22  Yes [provider]  nitroGLYCERIN (NITROSTAT) 0.4 MG SL tablet Place 1 tablet (0.4 mg total) under the tongue every  5 (five) minutes as needed for chest pain. 02/05/23  Yes Loetta Rough, MD  pantoprazole (PROTONIX) 40 MG tablet Take 1 tablet (40 mg total) by mouth daily. 11/25/22 11/25/23 Yes Vassie Loll, MD  potassium chloride SA (KLOR-CON M) 20 MEQ tablet Take 1 tablet (20 mEq total) by mouth daily. 07/26/22 02/05/24 Yes Shahmehdi, Gemma Payor, MD  REPATHA SURECLICK 140 MG/ML SOAJ Inject 1 mL into the skin every 14 (fourteen) days. 10/19/22  Yes [provider]  torsemide (DEMADEX) 20 MG tablet Take 1 tablet (20 mg total) by mouth daily. 11/16/22  Yes Kendell Bane, MD    Physical Exam: Vitals:   02/21/23 1145 02/21/23 1149 02/21/23 1501 02/21/23 1515  BP: (!) 174/93  (!) 160/92 (!) 163/115  Pulse: 74  79 77  Resp: 20  17  (!) 22  Temp:   97.6 F (36.4 C)   TempSrc:   Oral   SpO2: 95%  95% 98%  Weight:  66.2 kg  66.6 kg  Height:  6' (1.829 m)  6' (1.829 m)  Gen: 68yo M WDWN in no distress  HEENT: Injected right conjunctiva (s/p recent eye surgery per pt) and facial changes s/p rhinectomy.  Pulm: Breathing room air at normal rate and effort as I walk into the room, lungs are clear without crackles or wheezes  CV: RRR, no MRG, 1+ LE edema GI: Soft, NT, ND, +BS Neuro: Alert and oriented. No new focal deficits. Ext: Warm, no deformities. Skin: No new rashes, lesions or ulcers on visualized skin   Data Reviewed: SCr 1.57 near baseline, BUN 22, albumin 3.1, TBili 1.6 (down from 1.7, near/below recent baseline)   No evidence of pulmonary embolism. 2. Cardiomegaly with small bilateral pleural effusions and reflux of contrast into the hepatic veins and IVC, suggesting elevated right heart pressures and possible mild congestive failure. 3. No other explanation for shortness of breath. 4. Aortic atherosclerosis (ICD10-I70.0), coronary artery atherosclerosis and emphysema (ICD10-J43.9). 5. Cholelithiasis. Possible intrahepatic biliary duct dilatation, suboptimally evaluated. If bilirubin levels are elevated, consider dedicated nonemergent outpatient MRCP. 6. Pulmonary artery enlargement suggests pulmonary arterial hypertension.  Assessment and Plan: Shortness of breath: Multifactorial, though imaging, BNP, exam do suggest an element of overload, pulmonary HTN without PE. No wheezing on my exam.  - Treat conditions below.   Acute on chronic HFrEF, HTN: He is clinically fluid overloaded with pleural effusions and JVD on exam, LE edema. Last echo 11/05/2022 w/LVEF 25%, mild decreased RV function, global HK, mild MR/TR. - Given losartan for elevated BP in ED. Continue tmrw. - Continue coreg - Given lasix 60mg  IV this evening. Will monitor I/O, daily weights, AM BMP.  - Reevaluate clinical status in AM. If not  hypoxemic, etc. would be candidate for discharge.  - I appreciate cardiology discussing the case with me, they will evaluate in AM formally but agree with this plan.     Syncope: Has been a recurrent problem with a history of orthostatic hypotension - Remain on telemetry. Has cardiac monitoring as outpatient.    COPD, emphysema:  - Continue controller - Hold steroids for now. - Continue prn albuterol neb.   CKD stage IIIb: Creatinine currently near baseline. Baseline creatinine 1.8-2.0. Monitor in AM.   History of AFib with RVR: Per cardiology consult note 02/05/2023.  - Continue coreg - Continue eliquis   Right carotid stenosis: He is now following with Dr. Olga Coaster was not felt to be a candidate for carotid stenting and suboptimal candidate for endarterectomy.  Plan is for medical therapy at this point - Continue eliquis   Basal cell carcinoma of nasolabial fold 06/06/2017--right partial rhinectomy, resection of left intranasal lesion, skin graft @[facility]. Margins were clear on pathology.   Iron deficiency anemia/Advanced colonic adenoma: Colonoscopy 05/31/2017--Dr. Rourk. He was noted to have numerous polyps as well as a polypoid mass in the hepatic flexure to the cecum area, tubular adenoma pathology. - He was referred to general surgery, urged him to follow up.   Hyperbilirubinemia: Primarily suspect congestive hepatopathy with proven contrast reflux into hepatic veins on CTA today. This is stable with otherwise normal LFTs and benign exam.   Cholelithiasis: Asymptomatic.  - Consider dedicated nonemergent outpatient MRCP.   Leukocytosis: No nidus of infection currently noted.  - Monitor off abx.   Advance Care Planning: Full code  Consults: Cardiology  Family Communication: None  Severity of Illness: The appropriate patient status for this patient is OBSERVATION. Observation status is judged to be reasonable and necessary in order to provide the required intensity of service  to ensure the patient's safety. The patient's presenting symptoms, physical exam findings, and initial radiographic and laboratory data in the context of their medical condition is felt to place them at decreased risk for further clinical deterioration. Furthermore, it is anticipated that the patient will be medically stable for discharge from the hospital within 2 midnights of admission.   Author: Tyrone Nine, MD 02/21/2023 4:41 PM  For on call review [email or web address].

## 2023-02-21 NOTE — ED Provider Notes (Signed)
[place] EMERGENCY DEPARTMENT AT [facility] Provider Note   CSN: [id number] Arrival date & time: 02/21/23  1036     History  Chief Complaint  Patient presents with   Shortness of Breath    Michael [name] is a 68 y.o. male.   Shortness of Breath   68 year old male presents emergency department complaints of shortness of breath.  Reports shortness of breath over the past few days worse with exertion.  Denies any chest pain.  States he has not been coughing no fever.  Reports compliance with his at home medicines.  Denies any abdominal pain, nausea, vomiting, urinary symptoms, change in bowel habits.  States he feels like he is retaining a little bit more fluid more swelling in his legs.  Denies use of oxygen at baseline.  Past medical history significant for CAD, CHF with left ventricular ejection fraction 20 to 25% with left ventricle demonstrating global hypokinesis, head neck cancer, cardiomyopathy, CKD 3, COPD, hypertension, iron deficiency anemia, STEMI, first-degree AV block tobacco abuse, partial rhinectomy maxillectomy, right-sided carotid artery stenosis, hyperlipidemia  Home Medications Prior to Admission medications   Medication Sig Start Date End Date Taking? Authorizing Provider  albuterol (VENTOLIN HFA) 108 (90 Base) MCG/ACT inhaler Inhale 2 puffs into the lungs every 6 (six) hours as needed for wheezing or shortness of breath. 04/27/22  Yes Shahmehdi, Gemma Payor, MD  apixaban (ELIQUIS) 5 MG TABS tablet Take 1 tablet (5 mg total) by mouth 2 (two) times daily. 02/05/23 04/06/23 Yes Loetta Rough, MD  aspirin EC 81 MG tablet Take 1 tablet (81 mg total) by mouth daily. Swallow whole. 11/26/22  Yes Vassie Loll, MD  atorvastatin (LIPITOR) 40 MG tablet Take 1 tablet (40 mg total) by mouth every evening. Patient taking differently: Take 40 mg by mouth daily. 08/01/22 02/05/24 Yes Shahmehdi, Gemma Payor, MD  Budeson-Glycopyrrol-Formoterol (BREZTRI AEROSPHERE) 160-9-4.8  MCG/ACT AERO Inhale 2 puffs into the lungs 2 (two) times daily.   Yes [provider]  carvedilol (COREG) 12.5 MG tablet Take 1 tablet (12.5 mg total) by mouth 2 (two) times daily with a meal. 11/25/22  Yes Vassie Loll, MD  diltiazem (CARDIZEM) 30 MG tablet Take 1 tablet (30 mg total) by mouth once as needed for up to 30 doses (If heart rate consistently >110 bpm). 02/05/23  Yes Loetta Rough, MD  feeding supplement (ENSURE ENLIVE / ENSURE PLUS) LIQD Take 237 mLs by mouth 3 (three) times daily between meals. Patient taking differently: Take 237 mLs by mouth daily. 11/25/22  Yes Vassie Loll, MD  ILEVRO 0.3 % ophthalmic suspension Place 1 drop into both eyes in the morning and at bedtime. 12/12/22  Yes [provider]  losartan (COZAAR) 25 MG tablet Take 25 mg by mouth daily. 12/23/22  Yes [provider]  nitroGLYCERIN (NITROSTAT) 0.4 MG SL tablet Place 1 tablet (0.4 mg total) under the tongue every 5 (five) minutes as needed for chest pain. 02/05/23  Yes Loetta Rough, MD  pantoprazole (PROTONIX) 40 MG tablet Take 1 tablet (40 mg total) by mouth daily. 11/25/22 11/25/23 Yes Vassie Loll, MD  potassium chloride SA (KLOR-CON M) 20 MEQ tablet Take 1 tablet (20 mEq total) by mouth daily. 07/26/22 02/05/24 Yes Shahmehdi, Gemma Payor, MD  REPATHA SURECLICK 140 MG/ML SOAJ Inject 1 mL into the skin every 14 (fourteen) days. 10/19/22  Yes [provider]  torsemide (DEMADEX) 20 MG tablet Take 1 tablet (20 mg total) by mouth daily. 11/16/22  Yes  Kendell Bane, MD      Allergies    Patient has no known allergies.    Review of Systems   Review of Systems  Respiratory:  Positive for shortness of breath.   All other systems reviewed and are negative.   Physical Exam Updated Vital Signs BP (!) 163/115   Pulse 77   Temp 97.6 F (36.4 C) (Oral)   Resp (!) 22   Ht 6' (1.829 m)   Wt 66.6 kg   SpO2 98%   BMI 19.91 kg/m  Physical Exam Vitals and nursing note  reviewed.  Constitutional:      General: He is not in acute distress.    Appearance: He is well-developed.  HENT:     Head: Normocephalic and atraumatic.  Eyes:     Conjunctiva/sclera: Conjunctivae normal.  Cardiovascular:     Rate and Rhythm: Normal rate and regular rhythm.  Pulmonary:     Effort: Pulmonary effort is normal. Tachypnea present. No respiratory distress.     Breath sounds: Wheezing present.     Comments: Patient initially tachycardia. Abdominal:     Palpations: Abdomen is soft.     Tenderness: There is no abdominal tenderness. There is no guarding.  Musculoskeletal:        General: No swelling.     Cervical back: Neck supple.     Right lower leg: Edema present.     Left lower leg: Edema present.  Skin:    General: Skin is warm and dry.     Capillary Refill: Capillary refill takes less than 2 seconds.  Neurological:     Mental Status: He is alert.  Psychiatric:        Mood and Affect: Mood normal.     ED Results / Procedures / Treatments   Labs (all labs ordered are listed, but only abnormal results are displayed) Labs Reviewed  COMPREHENSIVE METABOLIC PANEL - Abnormal; Notable for the following components:      Result Value   Glucose, Bld 112 (*)    Creatinine, Ser 1.57 (*)    Calcium 8.7 (*)    Albumin 3.1 (*)    Total Bilirubin 1.6 (*)    GFR, Estimated 48 (*)    All other components within normal limits  CBC - Abnormal; Notable for the following components:   WBC 11.4 (*)    RBC 3.87 (*)    Hemoglobin 11.0 (*)    HCT 35.6 (*)    RDW 16.6 (*)    All other components within normal limits  BRAIN NATRIURETIC PEPTIDE - Abnormal; Notable for the following components:   B Natriuretic Peptide >4,500.0 (*)    All other components within normal limits  TROPONIN I (HIGH SENSITIVITY) - Abnormal; Notable for the following components:   Troponin I (High Sensitivity) 63 (*)    All other components within normal limits  TROPONIN I (HIGH SENSITIVITY) -  Abnormal; Notable for the following components:   Troponin I (High Sensitivity) 53 (*)    All other components within normal limits  RESP PANEL BY RT-PCR (RSV, FLU A&B, COVID)  [id number]  CBC  COMPREHENSIVE METABOLIC PANEL    EKG EKG Interpretation Date/Time:  Tuesday February 21 2023 11:23:18 EST Ventricular Rate:  76 PR Interval:  250 QRS Duration:  129 QT Interval:  440 QTC Calculation: 495 R Axis:   86  Text Interpretation: Sinus or ectopic atrial rhythm Ventricular premature complex Prolonged PR interval Nonspecific intraventricular conduction delay Anterior infarct, old Borderline  repolarization abnormality Confirmed by Alvino Blood ([id number]) on 02/21/2023 1:22:21 PM  Radiology CT Angio Chest PE W/Cm &/Or Wo Cm Result Date: 02/21/2023 CLINICAL DATA:  Shortness of breath for 3 days.  History of COPD. EXAM: CT ANGIOGRAPHY CHEST WITH CONTRAST TECHNIQUE: Multidetector CT imaging of the chest was performed using the standard protocol during bolus administration of intravenous contrast. Multiplanar CT image reconstructions and MIPs were obtained to evaluate the vascular anatomy. RADIATION DOSE REDUCTION: This exam was performed according to the departmental dose-optimization program which includes automated exposure control, adjustment of the mA and/or kV according to patient size and/or use of iterative reconstruction technique. CONTRAST:  OMNIPAQUE IOHEXOL 350 MG/ML SOLN COMPARISON:  Chest radiograph of earlier today. Chest CT of 11/22/2022. FINDINGS: Cardiovascular: The quality of this exam for evaluation of pulmonary embolism is good. No evidence of pulmonary embolism. Aortic atherosclerosis. Moderate cardiomegaly, with trace pericardial fluid, likely physiologic. Left main and 3 vessel coronary artery calcification. Pulmonary artery enlargement, outflow tract 3.6 cm. Mediastinum/Nodes: No mediastinal or hilar adenopathy. Lungs/Pleura: Small right pleural effusion is similar. Trace left  pleural fluid is new. Mild centrilobular emphysema.  Scattered calcified granulomas. Upper Abdomen: Reflux of contrast into the hepatic veins and IVC. Right hepatic lobe low-density lesion is likely a cyst at 8 mm. Stone in the gallbladder neck of 2.1 cm. Gallbladder incompletely imaged. Possible intrahepatic biliary duct dilatation, incompletely imaged. Chronic calcific pancreatitis with chronic atrophy and duct dilatation as detailed on 10/20/2021 dedicated CT. Bilateral renal cortical thinning/atrophy. Musculoskeletal: No acute osseous abnormality. Review of the MIP images confirms the above findings. IMPRESSION: 1.  No evidence of pulmonary embolism. 2. Cardiomegaly with small bilateral pleural effusions and reflux of contrast into the hepatic veins and IVC, suggesting elevated right heart pressures and possible mild congestive failure. 3. No other explanation for shortness of breath. 4. Aortic atherosclerosis (ICD10-I70.0), coronary artery atherosclerosis and emphysema (ICD10-J43.9). 5. Cholelithiasis. Possible intrahepatic biliary duct dilatation, suboptimally evaluated. If bilirubin levels are elevated, consider dedicated nonemergent outpatient MRCP. 6. Pulmonary artery enlargement suggests pulmonary arterial hypertension. Electronically Signed   By: Jeronimo Greaves M.D.   On: 02/21/2023 13:44   DG Chest 2 View Result Date: 02/21/2023 CLINICAL DATA:  68 year old male with shortness of breath for 3 days. EXAM: CHEST - 2 VIEW COMPARISON:  Portable chest 02/05/2023 and earlier. FINDINGS: PA and lateral views 1125 hours. Chronic cardiomegaly and large lung volumes. Calcified aortic atherosclerosis. Stable cardiac size and mediastinal contours. Probable left lung nipple shadow, new since 02/05/2023 and and not correlated on CTA in October. Otherwise lung markings, pulmonary vascularity appears stable with no pneumothorax, pulmonary edema, pleural effusion or confluent lung opacity. Osteopenia. No acute osseous  abnormality identified. Negative visible bowel gas. IMPRESSION: 1. Chronic cardiomegaly and pulmonary hyperinflation. Aortic Atherosclerosis (ICD10-I70.0). 2. Left nipple shadow suspected. No acute cardiopulmonary abnormality. Electronically Signed   By: Odessa Fleming M.D.   On: 02/21/2023 11:54    Procedures Procedures    Medications Ordered in ED Medications  Oral care mouth rinse (has no administration in time range)  atorvastatin (LIPITOR) tablet 40 mg (has no administration in time range)  carvedilol (COREG) tablet 12.5 mg (has no administration in time range)  nitroGLYCERIN (NITROSTAT) SL tablet 0.4 mg (has no administration in time range)  apixaban (ELIQUIS) tablet 5 mg (has no administration in time range)  potassium chloride SA (KLOR-CON M) CR tablet 20 mEq (has no administration in time range)  nepafenac (ILEVRO) 0.3 % ophthalmic suspension 1 drop (has no  administration in time range)  losartan (COZAAR) tablet 25 mg (has no administration in time range)  sodium chloride flush (NS) 0.9 % injection 3 mL (has no administration in time range)  acetaminophen (TYLENOL) tablet 650 mg (has no administration in time range)    Or  acetaminophen (TYLENOL) suppository 650 mg (has no administration in time range)  hydrALAZINE (APRESOLINE) injection 10 mg (has no administration in time range)  fluticasone furoate-vilanterol (BREO ELLIPTA) 200-25 MCG/ACT 1 puff (has no administration in time range)    And  umeclidinium bromide (INCRUSE ELLIPTA) 62.5 MCG/ACT 1 puff (has no administration in time range)  ipratropium-albuterol (DUONEB) 0.5-2.5 (3) MG/3ML nebulizer solution 3 mL (3 mLs Nebulization Given 02/21/23 1105)  albuterol (PROVENTIL) (2.5 MG/3ML) 0.083% nebulizer solution 2.5 mg (2.5 mg Nebulization Given 02/21/23 1105)  furosemide (LASIX) injection 40 mg (40 mg Intravenous Given 02/21/23 1216)  iohexol (OMNIPAQUE) 350 MG/ML injection 100 mL (100 mLs Intravenous Contrast Given 02/21/23 1309)   losartan (COZAAR) tablet 25 mg (25 mg Oral Given 02/21/23 1504)    ED Course/ Medical Decision Making/ A&P Clinical Course as of 02/21/23 1804  Tue Feb 21, 2023  1601 Consulted with hospitalist Dr. Jarvis Newcomer who agreed with admission and assume further treatment/care. [CR]    Clinical Course User Index [CR] Peter Garter, PA                                 Medical Decision Making Amount and/or Complexity of Data Reviewed Labs: ordered. Radiology: ordered.  Risk OTC drugs. Prescription drug management. Decision regarding hospitalization.   This patient presents to the ED for concern of shortness of breath, this involves an extensive number of treatment options, and is a complaint that carries with it a high risk of complications and morbidity.  The differential diagnosis includes COPD, asthma, CHF, ACS, PE, pneumonia, pneumothorax, viral URI, other   Co morbidities that complicate the patient evaluation  See HPI   Additional history obtained:  Additional history obtained from EMR External records from outside source obtained and reviewed including hospital records   Lab Tests:  I Ordered, and personally interpreted labs.  The pertinent results include: Leukocytosis of 11.4.  Anemia with hemoglobin 11.0.  Platelets within range.  No Electra abnormalities.  Patient with baseline renal dysfunction creatinine 1.57, BUN of 22 GFR 48.  No transaminitis.  BNP elevated of greater than 4500.  Viral testing negative.  Initial troponin of 60 with repeat 53   Imaging Studies ordered:  I ordered imaging studies including chest ray, CT angio chest PE I independently visualized and interpreted imaging which showed  Chest x-ray: Cardiomegaly with pulmonary hyperinflation.  Aortic atherosclerosis. CT angio chest PE: No evidence of PE.  Cardiomegaly with small bilateral pleural effusions reflux into hepatic veins and IVC concerning for elevated right heart pressures.  Aortic  atherosclerosis.  Cholelithiasis with intrahepatic biliary duct dilation.  Pulmonary artery enlargement. I agree with the radiologist interpretation  Cardiac Monitoring: / EKG:  The patient was maintained on a cardiac monitor.  I personally viewed and interpreted the cardiac monitored which showed an underlying rhythm of: Sinus's or ectopic atrial rhythm.  VPC present.  Prolonged PR.  Borderline repolarization abnormality.  Consultations Obtained:  See ED course  Problem List / ED Course / Critical interventions / Medication management  CHF, dyspnea I ordered medication including DuoNeb, Proventil, Lasix   Reevaluation of the patient after these medicines showed  that the patient slightly improved I have reviewed the patients home medicines and have made adjustments as needed   Social Determinants of Health:  Former cigarette use.  Denies illicit drug use.   Test / Admission - Considered:  CHF, dyspnea Vitals signs significant for tachypnea, hypertension. Otherwise within normal range and stable throughout visit. Laboratory/imaging studies significant for: See above 68 year old male presents emergency department with complaints of shortness of breath.  Progressively worsening over the past few days without improvement with continuation of patient's home medications.  On exam, wheezing appreciated bilateral lung fields with some lower extremity edema.  Initial concern for volume overload versus COPD exacerbation.  Patient with initial troponin of 63 with repeat 53; EKG with nonspecific ST/T wave changes; low suspicion for ACS.  Chest x-ray without any acute abnormality by CT angio chest PE concerning for bilateral pleural effusions, cardiomegaly with hepatic and IVC reflux concerning for CHF.  Treated with DuoNeb along with Lasix with slight improvement of symptoms the patient still with labored breathing and tachypnea with respiratory rates in the 30s although without true hypoxia.  Given  patient's markedly reduced left ventricular ejection fraction with only some improvement of initial dose of IV diuresis as well as baseline renal dysfunction, shared decision making conversation was had with patient regarding trial of at home management of symptoms versus admission for IV diuresis and further workup/assessment.  Could also be a COPD component as after nebulized breathing treatments were administered, some improvement of wheezing prior to IV Lasix was administered.  Patient elected for admission.  Consultations were made as described in ED course.  Patient stable upon admission.        Final Clinical Impression(s) / ED Diagnoses Final diagnoses:  Acute on chronic congestive heart failure, unspecified heart failure type [facility])    Rx / DC Orders ED Discharge Orders     None         Peter Garter, [place] 02/21/23 1804    Lonell Grandchild, MD 02/22/23 [phone]

## 2023-02-21 NOTE — ED Notes (Signed)
Patient provided with crackers and water per request and per PA approval.

## 2023-02-21 NOTE — ED Notes (Signed)
Messaged provider about patient requesting to eat at this time.

## 2023-02-22 DIAGNOSIS — I779 Disorder of arteries and arterioles, unspecified: Secondary | ICD-10-CM | POA: Diagnosis not present

## 2023-02-22 DIAGNOSIS — I6521 Occlusion and stenosis of right carotid artery: Secondary | ICD-10-CM

## 2023-02-22 DIAGNOSIS — I252 Old myocardial infarction: Secondary | ICD-10-CM | POA: Diagnosis not present

## 2023-02-22 DIAGNOSIS — N183 Chronic kidney disease, stage 3 unspecified: Secondary | ICD-10-CM | POA: Diagnosis not present

## 2023-02-22 DIAGNOSIS — I428 Other cardiomyopathies: Secondary | ICD-10-CM

## 2023-02-22 DIAGNOSIS — R55 Syncope and collapse: Secondary | ICD-10-CM

## 2023-02-22 DIAGNOSIS — I4729 Other ventricular tachycardia: Secondary | ICD-10-CM | POA: Diagnosis not present

## 2023-02-22 DIAGNOSIS — Z7901 Long term (current) use of anticoagulants: Secondary | ICD-10-CM | POA: Diagnosis not present

## 2023-02-22 DIAGNOSIS — Z79899 Other long term (current) drug therapy: Secondary | ICD-10-CM | POA: Diagnosis not present

## 2023-02-22 DIAGNOSIS — I21A1 Myocardial infarction type 2: Secondary | ICD-10-CM | POA: Diagnosis present

## 2023-02-22 DIAGNOSIS — E785 Hyperlipidemia, unspecified: Secondary | ICD-10-CM | POA: Diagnosis present

## 2023-02-22 DIAGNOSIS — I10 Essential (primary) hypertension: Secondary | ICD-10-CM | POA: Diagnosis not present

## 2023-02-22 DIAGNOSIS — R7989 Other specified abnormal findings of blood chemistry: Secondary | ICD-10-CM

## 2023-02-22 DIAGNOSIS — D72829 Elevated white blood cell count, unspecified: Secondary | ICD-10-CM | POA: Diagnosis present

## 2023-02-22 DIAGNOSIS — Z7982 Long term (current) use of aspirin: Secondary | ICD-10-CM | POA: Diagnosis not present

## 2023-02-22 DIAGNOSIS — W19XXXA Unspecified fall, initial encounter: Secondary | ICD-10-CM | POA: Diagnosis present

## 2023-02-22 DIAGNOSIS — E890 Postprocedural hypothyroidism: Secondary | ICD-10-CM | POA: Diagnosis present

## 2023-02-22 DIAGNOSIS — J449 Chronic obstructive pulmonary disease, unspecified: Secondary | ICD-10-CM | POA: Diagnosis not present

## 2023-02-22 DIAGNOSIS — Z87891 Personal history of nicotine dependence: Secondary | ICD-10-CM | POA: Diagnosis not present

## 2023-02-22 DIAGNOSIS — I480 Paroxysmal atrial fibrillation: Secondary | ICD-10-CM

## 2023-02-22 DIAGNOSIS — Z1152 Encounter for screening for COVID-19: Secondary | ICD-10-CM | POA: Diagnosis not present

## 2023-02-22 DIAGNOSIS — D509 Iron deficiency anemia, unspecified: Secondary | ICD-10-CM | POA: Diagnosis present

## 2023-02-22 DIAGNOSIS — N1832 Chronic kidney disease, stage 3b: Secondary | ICD-10-CM | POA: Diagnosis present

## 2023-02-22 DIAGNOSIS — I130 Hypertensive heart and chronic kidney disease with heart failure and stage 1 through stage 4 chronic kidney disease, or unspecified chronic kidney disease: Secondary | ICD-10-CM | POA: Diagnosis present

## 2023-02-22 DIAGNOSIS — I5023 Acute on chronic systolic (congestive) heart failure: Secondary | ICD-10-CM | POA: Diagnosis present

## 2023-02-22 DIAGNOSIS — J439 Emphysema, unspecified: Secondary | ICD-10-CM | POA: Diagnosis present

## 2023-02-22 DIAGNOSIS — C44311 Basal cell carcinoma of skin of nose: Secondary | ICD-10-CM | POA: Diagnosis not present

## 2023-02-22 DIAGNOSIS — E876 Hypokalemia: Secondary | ICD-10-CM | POA: Diagnosis present

## 2023-02-22 DIAGNOSIS — I2510 Atherosclerotic heart disease of native coronary artery without angina pectoris: Secondary | ICD-10-CM | POA: Diagnosis present

## 2023-02-22 DIAGNOSIS — I700 Atherosclerosis of aorta: Secondary | ICD-10-CM | POA: Diagnosis present

## 2023-02-22 DIAGNOSIS — I472 Ventricular tachycardia, unspecified: Secondary | ICD-10-CM | POA: Diagnosis not present

## 2023-02-22 DIAGNOSIS — Z66 Do not resuscitate: Secondary | ICD-10-CM | POA: Diagnosis present

## 2023-02-22 DIAGNOSIS — Z85828 Personal history of other malignant neoplasm of skin: Secondary | ICD-10-CM | POA: Diagnosis not present

## 2023-02-22 LAB — CBC
HCT: 36.2 % — ABNORMAL LOW (ref 39.0–52.0)
Hemoglobin: 10.9 g/dL — ABNORMAL LOW (ref 13.0–17.0)
MCH: 28.1 pg (ref 26.0–34.0)
MCHC: 30.1 g/dL (ref 30.0–36.0)
MCV: 93.3 fL (ref 80.0–100.0)
Platelets: 371 10*3/uL (ref 150–400)
RBC: 3.88 MIL/uL — ABNORMAL LOW (ref 4.22–5.81)
RDW: 16.4 % — ABNORMAL HIGH (ref 11.5–15.5)
WBC: 10.6 10*3/uL — ABNORMAL HIGH (ref 4.0–10.5)
nRBC: 0 % (ref 0.0–0.2)

## 2023-02-22 LAB — COMPREHENSIVE METABOLIC PANEL
ALT: 16 U/L (ref 0–44)
AST: 21 U/L (ref 15–41)
Albumin: 2.9 g/dL — ABNORMAL LOW (ref 3.5–5.0)
Alkaline Phosphatase: 116 U/L (ref 38–126)
Anion gap: 8 (ref 5–15)
BUN: 24 mg/dL — ABNORMAL HIGH (ref 8–23)
CO2: 25 mmol/L (ref 22–32)
Calcium: 8.6 mg/dL — ABNORMAL LOW (ref 8.9–10.3)
Chloride: 109 mmol/L (ref 98–111)
Creatinine, Ser: 1.74 mg/dL — ABNORMAL HIGH (ref 0.61–1.24)
GFR, Estimated: 42 mL/min — ABNORMAL LOW (ref >60)
Glucose, Bld: 103 mg/dL — ABNORMAL HIGH (ref 70–99)
Potassium: 4.1 mmol/L (ref 3.5–5.1)
Sodium: 142 mmol/L (ref 135–145)
Total Bilirubin: 1.3 mg/dL — ABNORMAL HIGH (ref 0.0–1.2)
Total Protein: 6.3 g/dL — ABNORMAL LOW (ref 6.5–8.1)

## 2023-02-22 LAB — GLUCOSE, CAPILLARY: Glucose-Capillary: 117 mg/dL — ABNORMAL HIGH (ref 70–99)

## 2023-02-22 MED ORDER — FUROSEMIDE 10 MG/ML IJ SOLN
40.0000 mg | Freq: Once | INTRAMUSCULAR | Status: AC
  Administered 2023-02-22: 40 mg via INTRAVENOUS
  Filled 2023-02-22: qty 4

## 2023-02-22 MED ORDER — FUROSEMIDE 10 MG/ML IJ SOLN
80.0000 mg | Freq: Two times a day (BID) | INTRAMUSCULAR | Status: DC
  Administered 2023-02-22 – 2023-02-24 (×4): 80 mg via INTRAVENOUS
  Filled 2023-02-22 (×4): qty 8

## 2023-02-22 NOTE — Evaluation (Signed)
Occupational Therapy Evaluation Patient Details Name: Michael [name] MRN: [id number] DOB: 02-18-1955 Today's Date: 02/22/2023   History of Present Illness 68 y.o. male admitted on 02/21/23 due to SoB and dyspnea on exertion, both worsening over a few days. PMH significant for  chronic HFrEF, orthostatic hypotension with recurrent syncope, CKD stage III, coronary disease, COPD, tobacco abuse, basal cell carcinoma status post partial rhinectomy and maxillectomy 06/06/2017 at [facility], advanced colonic tubular adenoma, right carotid stenosis, hypertension, hyperlipidemia.   Clinical Impression   Pt admitted for concerns listed above. PTA pt reported that he was independent with all ADL's and IADL's, including cooking and cleaning as needed. At this time, pt appears near his baseline with mild SoB during activities, limiting activity tolerance. OT not recommending further skilled OT at this time and acute OT will sign off.        If plan is discharge home, recommend the following:      Functional Status Assessment  Patient has not had a recent decline in their functional status  Equipment Recommendations  None recommended by OT    Recommendations for Other Services       Precautions / Restrictions Precautions Precautions: None Restrictions Weight Bearing Restrictions Per Provider Order: No      Mobility Bed Mobility Overal bed mobility: Modified Independent             General bed mobility comments: No assist needed    Transfers Overall transfer level: Modified independent Equipment used: None               General transfer comment: A little wobbly upon standing, able to self correct and maintain balance      Balance Overall balance assessment: No apparent balance deficits (not formally assessed)                                         ADL either performed or assessed with clinical judgement   ADL Overall ADL's : At baseline;Modified  independent                                       General ADL Comments: No concerns at this time, able to demonstrate good follow through with all BADL's.     Vision Baseline Vision/History: 0 No visual deficits Ability to See in Adequate Light: 0 Adequate Patient Visual Report: No change from baseline Vision Assessment?: No apparent visual deficits     Perception         Praxis         Pertinent Vitals/Pain Pain Assessment Pain Assessment: No/denies pain     Extremity/Trunk Assessment Upper Extremity Assessment Upper Extremity Assessment: Overall WFL for tasks assessed   Lower Extremity Assessment Lower Extremity Assessment: Defer to PT evaluation   Cervical / Trunk Assessment Cervical / Trunk Assessment: Normal   Communication Communication Communication: No apparent difficulties   Cognition Arousal: Alert Behavior During Therapy: WFL for tasks assessed/performed Overall Cognitive Status: Within Functional Limits for tasks assessed                                       General Comments  VSS on RA    Exercises     Shoulder Instructions  Home Living Family/patient expects to be discharged to:: Private residence Living Arrangements: Non-relatives/Friends Available Help at Discharge: Friend(s);Available PRN/intermittently Type of Home: House Home Access: Stairs to enter [employer] of Steps: 2 steps in front, 6 steps in back Entrance Stairs-Rails: Right;Left Home Layout: Multi-level Alternate Level Stairs-Number of Steps: full flight Alternate Level Stairs-Rails: Right Bathroom Shower/Tub: Chief Strategy Officer: Standard     Home Equipment: None          Prior Functioning/Environment Prior Level of Function : Independent/Modified Independent             Mobility Comments: Household and short distanced community ambulator without use of an AD, does not drive ADLs Comments:  Independent        OT Problem List: Decreased strength;Decreased activity tolerance;Impaired balance (sitting and/or standing)      OT Treatment/Interventions:      OT Goals(Current goals can be found in the care plan section) Acute Rehab OT Goals Patient Stated Goal: To go home OT Goal Formulation: With patient Time For Goal Achievement: 02/22/23 Potential to Achieve Goals: Good  OT Frequency:      Co-evaluation              AM-PAC OT "6 Clicks" Daily Activity     Outcome Measure Help from another person eating meals?: None Help from another person taking care of personal grooming?: None Help from another person toileting, which includes using toliet, bedpan, or urinal?: None Help from another person bathing (including washing, rinsing, drying)?: None Help from another person to put on and taking off regular upper body clothing?: None Help from another person to put on and taking off regular lower body clothing?: None 6 Click Score: 24   End of Session Nurse Communication: Mobility status  Activity Tolerance: Patient tolerated treatment well Patient left: in bed;with call bell/phone within reach  OT Visit Diagnosis: Unsteadiness on feet (R26.81);Other abnormalities of gait and mobility (R26.89);Muscle weakness (generalized) (M62.81)                Time: 1009-1030 OT Time Calculation (min): 21 min Charges:  OT General Charges $OT Visit: 1 Visit OT Evaluation $OT Eval Moderate Complexity: 1 Mod  Alucard Fearnow Bing Plume, OTR/L [facility] Acute Rehabilitation  Bennette Hasty Elane Bing Plume 02/22/2023, 1:38 PM

## 2023-02-22 NOTE — TOC Initial Note (Addendum)
Transition of Care [facility]) - Initial/Assessment Note    Patient Details  Name: Michael [name] MRN: [id number] Date of Birth: 1955-04-20  Transition of Care [facility]) CM/SW Contact:    Isabella Bowens, LCSWA Phone Number: 02/22/2023, 1:42 PM  Clinical Narrative:                 CSW was consult for CHF. CHF education/ resources added to ASV. CSW assessed patient. Patient states that he does not follow a diet nor weighs himself. Patient does have a cardiologist. Patient states that he is independent and his friends that he lives with provides him with transportation. Patient states that he has no equipment at home and has had HH before. DC plans are for pt to return back home. CSW will continue to follow.   CSW spoke with pt regarding therapy recommendation . PT recommended OPPT. Patient was agreeable to CSW arranging OP PT locally in [place] at [facility]. Patient states that his friend will take him.   Expected Discharge Plan: Home w Home Health Services Barriers to Discharge: Continued Medical Work up   Patient Goals and CMS Choice Patient states their goals for this hospitalization and ongoing recovery are:: return home CMS Medicare.gov Compare Post Acute Care list provided to:: Patient Choice offered to / list presented to : Patient      Expected Discharge Plan and Services In-house Referral: Clinical Social Work     Living arrangements for the past 2 months: Single Family Home                    Prior Living Arrangements/Services Living arrangements for the past 2 months: Single Family Home Lives with:: Friends Patient language and need for interpreter reviewed:: Yes Do you feel safe going back to the place where you live?: Yes      Need for Family Participation in Patient Care: No (Comment) (Patient states that he is independent) Care giver support system in place?: Yes (comment)   Criminal Activity/Legal Involvement Pertinent to Current  Situation/Hospitalization: No - Comment as needed  Activities of Daily Living   ADL Screening (condition at time of admission) Independently performs ADLs?: Yes (appropriate for developmental age) Is the patient deaf or have difficulty hearing?: No Does the patient have difficulty seeing, even when wearing glasses/contacts?: No Does the patient have difficulty concentrating, remembering, or making decisions?: No  Permission Sought/Granted      Share Information with NAME: Michael [name]     Permission granted to share info w Relationship: Patient     Emotional Assessment Appearance:: Appears stated age   Affect (typically observed): Appropriate Orientation: : Oriented to Self, Oriented to Place, Oriented to  Time, Oriented to Situation Alcohol / Substance Use: Not Applicable Psych Involvement: No (comment)  Admission diagnosis:  Acute on chronic HFrEF (heart failure with reduced ejection fraction) (HCC) [I50.23] Acute on chronic congestive heart failure, unspecified heart failure type (HCC) [I50.9] Patient Active Problem List   Diagnosis Date Noted   Acute on chronic HFrEF (heart failure with reduced ejection fraction) (HCC) 02/21/2023   Atrial fibrillation with rapid ventricular response (HCC) 02/05/2023   Body mass index (BMI) 27.0-27.9, adult 12/01/2022   H/O prostatectomy 12/01/2022   Malnutrition of moderate degree 11/24/2022   Chronic obstructive pulmonary disease (HCC) 11/22/2022   Chronic diastolic CHF (congestive heart failure) (HCC) 11/22/2022   Near syncope 11/04/2022   Calculus of gallbladder without cholecystitis without obstruction 08/10/2022   Elevated troponin level not due myocardial infarction  07/29/2022   Acute on chronic combined systolic and diastolic CHF (congestive heart failure) (HCC) 07/24/2022   History of basal cell carcinoma (BCC) of skin 05/17/2022   Nicotine dependence, cigarettes, uncomplicated 05/17/2022   Noncompliance with treatment plan 05/17/2022    Orthostatic hypotension 05/17/2022   Vitamin D deficiency 05/17/2022   Troponin level elevated 04/29/2022   Elevated brain natriuretic peptide (BNP) level 04/29/2022   Solitary pulmonary nodule 04/26/2022   First degree heart block 04/12/2022   Fracture of rib 02/27/2022   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   COPD (chronic obstructive pulmonary disease) (HCC) 11/03/2021   Acute non-ST segment elevation myocardial infarction (HCC) 11/03/2021   Aneurysm of infrarenal abdominal aorta (HCC) 11/03/2021   Functional gait abnormality 11/03/2021   Hypoalbuminemia 11/03/2021   Hypomagnesemia 11/03/2021   Mixed hyperlipidemia 11/03/2021   Moderate protein-calorie malnutrition (HCC) 11/03/2021   Physical deconditioning 11/03/2021   Proteinuria 11/03/2021   Transaminitis 10/23/2021   Pressure injury of skin 10/22/2021   Type 2 MI (myocardial infarction) (HCC) 10/20/2021   Myocardial infarction (HCC) 10/20/2021   Atrophy of pancreas 08/11/2021   Mass of colon 08/06/2021   Preoperative cardiovascular examination 08/03/2021   CAD (coronary artery disease) 08/03/2021   Stage 3 chronic kidney disease (HCC) 08/03/2021   Hardening of the aorta (main artery of the heart) (HCC) 08/03/2021   Rectal hemorrhage 06/16/2021   History of adenomatous polyp of colon 06/16/2021   Carotid stenosis, right 02/15/2021   Occlusion and stenosis of right carotid artery 02/15/2021   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 12/06/2019   Incisional hernia 11/15/2019   Elevated PSA 06/19/2019   Benign prostatic hyperplasia with urinary obstruction 10/29/2018   CAP (community acquired pneumonia) 05/09/2018   Hypertensive emergency 05/09/2018   Chronic combined systolic and diastolic CHF (congestive heart failure) (HCC) 05/09/2018   Lobar pneumonia (HCC) 05/09/2018   Retention of urine 11/27/2017   Vasovagal syncope 09/17/2017   Sepsis due to urinary tract infection (HCC) 09/01/2017   Syncope and  collapse 07/05/2017   COPD with acute exacerbation (HCC) 07/05/2017   Acute kidney injury superimposed on CKD (HCC) 07/05/2017   Urinary tract infectious disease 07/05/2017   Leukocytosis 07/05/2017   Incomplete bladder emptying 07/05/2017   UTI (urinary tract infection) 07/05/2017   Disorder of carotid artery (HCC) 06/25/2017   Orthostatic syncope    Syncope due to orthostatic hypotension 06/24/2017   Cardiac murmur 06/24/2017   Prolonged QT interval 06/24/2017   Imaging of gastrointestinal tract abnormal 05/25/2017   Basal cell carcinoma of eyelid 05/15/2017   Basal cell carcinoma (BCC) of nostril 05/12/2017   Skin lesion of face 05/03/2017   Other specified disorders of nose and nasal sinuses 05/03/2017   Periapical abscess 04/04/2017   Iron deficiency anemia 04/04/2017   Essential (primary) hypertension 04/02/2017   Anemia of chronic disease 04/02/2017   Acute hypoxemic respiratory failure (HCC) 04/02/2017   Lactic acidosis 04/02/2017   Tobacco abuse 04/02/2017   Elevated troponin 04/02/2017   Thrombocytosis 04/02/2017   Hypokalemia 04/02/2017   Unspecified diastolic (congestive) heart failure (HCC) 04/02/2017   Nasal lesion    PCP:  Benita Stabile, MD Pharmacy:   [facility] [place], [place] - [address] [place] [address] Phone: [phone] Fax: [phone]     Social Drivers of Health (SDOH) Social History: SDOH Screenings   Food Insecurity: No Food Insecurity (02/21/2023)  Housing: Low Risk  (02/21/2023)  Transportation Needs: No Transportation Needs (  02/21/2023)  Utilities: Not At Risk (02/21/2023)  Alcohol Screen: Low Risk  (12/26/2022)  Depression (PHQ2-9): Low Risk  (12/26/2022)  Financial Resource Strain: Low Risk  (12/26/2022)  Physical Activity: Sufficiently Active (12/26/2022)  Social Connections: Patient Declined (02/21/2023)  Stress: No Stress Concern Present (12/26/2022)  Tobacco Use: Medium Risk (02/21/2023)   Health Literacy: Adequate Health Literacy (12/26/2022)   SDOH Interventions:     Readmission Risk Interventions    11/22/2022    1:58 PM 07/25/2022    1:09 PM 04/26/2022   11:46 AM  Readmission Risk Prevention Plan  Transportation Screening Complete Complete Complete  HRI or Home Care Consult   Complete  Social Work Consult for Recovery Care Planning/Counseling   Complete  Palliative Care Screening   Not Applicable  Medication Review Oceanographer) Complete Complete Complete  PCP or Specialist appointment within 3-5 days of discharge  Not Complete   HRI or Home Care Consult Complete Complete   SW Recovery Care/Counseling Consult Complete Complete   Palliative Care Screening Not Applicable Complete   Skilled Nursing Facility Not Applicable Not Applicable

## 2023-02-22 NOTE — Progress Notes (Addendum)
PROGRESS NOTE  Michael [name] JJK:[id number] DOB: 02-26-1955   PCP: Benita Stabile, MD  Patient is from: Home.  DOA: 02/21/2023 LOS: 0  Chief complaints Chief Complaint  Patient presents with   Shortness of Breath     Brief Narrative / Interim history: 68 year old M with PMH of HFrEF, orthostatic hypotension, recurrent syncope, CKD-3, CAD, COPD, BCC s/p thyroidectomy and maxillectomy in 2019 at [facility], right carotid stenosis, HTN and tobacco use disorder presented to ED with problem as if shortness of breath, DOE, orthopnea and edema, and admitted with working diagnosis of acute on chronic HFrEF.  BNP > 4600.  Heart prominent JVD on presentation.  CT angio chest negative for PE but small pleural effusion.  Mild elevated troponin without significant delta.  Started on IV diuretics.  Cardiology consulted.  Cardiology increased IV Lasix.   Subjective: Seen and examined earlier this morning.  No major events overnight of this morning.  Feels better from breathing standpoint.  Denies chest pain, nausea or vomiting.  Objective: Vitals:   02/22/23 0018 02/22/23 0335 02/22/23 0411 02/22/23 0852  BP: (!) 142/96  (!) 167/92   Pulse: 72  72   Resp: 20  20   Temp: 99.2 F (37.3 C)  98.7 F (37.1 C)   TempSrc: Oral  Oral   SpO2: 97%  98% 97%  Weight:  66.6 kg    Height:        Examination:  GENERAL: No apparent distress.  Nontoxic. HEENT: MMM.  Vision and hearing grossly intact.  NECK: Supple.  No JVD. RESP:  No IWOB.  Fair aeration bilaterally.  Bibasilar crackles. CVS:  RRR. Heart sounds normal.  ABD/GI/GU: BS+. Abd soft, NTND.  MSK/EXT:  Moves extremities. No apparent deformity. No edema.  SKIN: no apparent skin lesion or wound NEURO: Awake, alert and oriented appropriately.  No apparent focal neuro deficit. PSYCH: Calm. Normal affect.   Procedures:  None  Microbiology summarized: COVID-19, influenza and RSV PCR nonreactive  Assessment and plan: Acute on chronic HFrEF:  Presents with dyspnea, DOE, orthopnea and edema.  Likely due to medication none implants and fluid indiscretion.  Last echo 11/05/2022 w/LVEF 25%, mild decreased RV function, global HK, mild MR/TR. started on IV Lasix.  Still with prominent JVD and bibasilar crackles. -Appreciate help by cardiology-increased IV Lasix to 80 mg twice daily -Strict intake and output, daily weight, renal functions and electrolytes -GDMT-Coreg, low-dose losartan   Syncope/history of orthostatic hypotension -Check orthostatic vitals. -Continue telemetry   Chronic COPD, emphysema: Stable. -Continue controllers -Continue prn albuterol neb.  -May consider changing Coreg to beta-1 selective beta-blocker   CKD stage IIIb:  Baseline Cr 1.8-2.0. Monitor in AM. Recent Labs    11/13/22 1753 11/14/22 0428 11/15/22 0908 11/21/22 1501 11/23/22 0049 11/24/22 0421 11/25/22 0420 02/05/23 1840 02/21/23 1053 02/22/23 0427  BUN 27* 29* 36* 28* 39* 47* 50* 28* 22 24*  CREATININE 1.96* 2.09* 2.41* 1.87* 2.20* 2.06* 1.93* 1.74* 1.57* 1.74*  -Continue monitoring  Elevated troponin/type II MI/demand ischemia: Likely due to CHF exacerbation. -Manage CHF as above  History of AFib with RVR: Now rate controlled.  Per cardiology consult note 02/05/2023.  - Continue coreg and close   Right carotid stenosis: He is now following with Dr. Olga Coaster was not felt to be a candidate for carotid stenting and suboptimal candidate for endarterectomy. Plan is for medical therapy at this point -Continue eliquis   Basal cell carcinoma of nasolabial fold 06/06/2017--right partial rhinectomy, resection of left intranasal  lesion, skin graft @[facility]. Margins were clear on pathology.   Iron deficiency anemia/Advanced colonic adenoma: Colonoscopy 05/31/2017--Dr. Rourk. He was noted to have numerous polyps as well as a polypoid mass in the hepatic flexure to the cecum area, tubular adenoma pathology. -He was referred to general surgery, urged him to  follow up.    Hyperbilirubinemia: Primarily suspect congestive hepatopathy with proven contrast reflux into hepatic veins on CTA today. This is stable with otherwise normal LFTs and benign exam.    Cholelithiasis: Asymptomatic.  -Consider dedicated nonemergent outpatient MRCP.    Leukocytosis: No nidus of infection currently noted.  -Improving without antibiotics.  Body mass index is 19.91 kg/m.           DVT prophylaxis:   apixaban (ELIQUIS) tablet 5 mg  Code Status: Full code. Confirmed with patient. Patient has DNR paper in computer but doesn't recall expressing desire to be DNR ever Family Communication: None at bedside Level of care: Telemetry Status is: Observation The patient will require care spanning > 2 midnights and should be moved to inpatient because: Acute HFrEF   Final disposition: Likely home once medically stable Consultants:  Cardiology  55 minutes with more than 50% spent in reviewing records, counseling patient/family and coordinating care.   Sch Meds:  Scheduled Meds:  apixaban  5 mg Oral BID   atorvastatin  40 mg Oral QPM   carvedilol  12.5 mg Oral BID WC   fluticasone furoate-vilanterol  1 puff Inhalation Daily   And   umeclidinium bromide  1 puff Inhalation Daily   furosemide  80 mg Intravenous BID   losartan  25 mg Oral Daily   nepafenac  1 drop Both Eyes BID   potassium chloride SA  20 mEq Oral Daily   sodium chloride flush  3 mL Intravenous Q12H   Continuous Infusions: PRN Meds:.acetaminophen **OR** acetaminophen, hydrALAZINE, nitroGLYCERIN, mouth rinse  Antimicrobials: Anti-infectives (From admission, onward)    None        I have personally reviewed the following labs and images: CBC: Recent Labs  Lab 02/21/23 1053 02/22/23 0427  WBC 11.4* 10.6*  HGB 11.0* 10.9*  HCT 35.6* 36.2*  MCV 92.0 93.3  PLT 400 371   BMP &GFR Recent Labs  Lab 02/21/23 1053 02/22/23 0427  NA 140 142  K 4.1 4.1  CL 109 109  CO2 25 25   GLUCOSE 112* 103*  BUN 22 24*  CREATININE 1.57* 1.74*  CALCIUM 8.7* 8.6*   Estimated Creatinine Clearance: 38.3 mL/min (A) (by C-G formula based on SCr of 1.74 mg/dL (H)). Liver & Pancreas: Recent Labs  Lab 02/21/23 1053 02/22/23 0427  AST 21 21  ALT 18 16  ALKPHOS 120 116  BILITOT 1.6* 1.3*  PROT 6.8 6.3*  ALBUMIN 3.1* 2.9*   No results for input(s): "LIPASE", "AMYLASE" in the last 168 hours. No results for input(s): "AMMONIA" in the last 168 hours. Diabetic: No results for input(s): "HGBA1C" in the last 72 hours. No results for input(s): "GLUCAP" in the last 168 hours. Cardiac Enzymes: No results for input(s): "CKTOTAL", "CKMB", "CKMBINDEX", "TROPONINI" in the last 168 hours. No results for input(s): "PROBNP" in the last 8760 hours. Coagulation Profile: No results for input(s): "INR", "PROTIME" in the last 168 hours. Thyroid Function Tests: No results for input(s): "TSH", "T4TOTAL", "FREET4", "T3FREE", "THYROIDAB" in the last 72 hours. Lipid Profile: No results for input(s): "CHOL", "HDL", "LDLCALC", "TRIG", "CHOLHDL", "LDLDIRECT" in the last 72 hours. Anemia Panel: No results for input(s): "VITAMINB12", "  FOLATE", "FERRITIN", "TIBC", "IRON", "RETICCTPCT" in the last 72 hours. Urine analysis:    Component Value Date/Time   COLORURINE YELLOW 11/21/2022 2100   APPEARANCEUR CLEAR 11/21/2022 2100   APPEARANCEUR Cloudy (A) 06/16/2021 0936   LABSPEC 1.017 11/21/2022 2100   LABSPEC 1.018 04/23/2011 1044   PHURINE 5.0 11/21/2022 2100   GLUCOSEU NEGATIVE 11/21/2022 2100   GLUCOSEU Negative 04/23/2011 1044   HGBUR NEGATIVE 11/21/2022 2100   BILIRUBINUR NEGATIVE 11/21/2022 2100   BILIRUBINUR Negative 06/16/2021 0936   BILIRUBINUR Negative 04/23/2011 1044   KETONESUR NEGATIVE 11/21/2022 2100   PROTEINUR 100 (A) 11/21/2022 2100   NITRITE NEGATIVE 11/21/2022 2100   LEUKOCYTESUR NEGATIVE 11/21/2022 2100   LEUKOCYTESUR Trace 04/23/2011 1044   Sepsis Labs: Invalid input(s):  "PROCALCITONIN", "LACTICIDVEN"  Microbiology: Recent Results (from the past 240 hours)  Resp panel by RT-PCR (RSV, Flu A&B, Covid) Anterior Nasal Swab     Status: None   Collection Time: 02/21/23 11:04 AM   Specimen: Anterior Nasal Swab  Result Value Ref Range Status   SARS Coronavirus 2 by RT PCR NEGATIVE NEGATIVE Final    Comment: (NOTE) SARS-CoV-2 target nucleic acids are NOT DETECTED.  The SARS-CoV-2 RNA is generally detectable in upper respiratory specimens during the acute phase of infection. The lowest concentration of SARS-CoV-2 viral copies this assay can detect is 138 copies/mL. A negative result does not preclude SARS-Cov-2 infection and should not be used as the sole basis for treatment or other patient management decisions. A negative result may occur with  improper specimen collection/handling, submission of specimen other than nasopharyngeal swab, presence of viral mutation(s) within the areas targeted by this assay, and inadequate number of viral copies(<138 copies/mL). A negative result must be combined with clinical observations, patient history, and epidemiological information. The expected result is Negative.  Fact Sheet for Patients:  BloggerCourse.com  Fact Sheet for Healthcare Providers:  SeriousBroker.it  This test is no t yet approved or cleared by the [place] FDA and  has been authorized for detection and/or diagnosis of SARS-CoV-2 by FDA under an Emergency Use Authorization (EUA). This EUA will remain  in effect (meaning this test can be used) for the duration of the COVID-19 declaration under Section 564(b)(1) of the Act, 21 U.S.C.section 360bbb-3(b)(1), unless the authorization is terminated  or revoked sooner.       Influenza A by PCR NEGATIVE NEGATIVE Final   Influenza B by PCR NEGATIVE NEGATIVE Final    Comment: (NOTE) The Xpert Xpress SARS-CoV-2/FLU/RSV plus assay is intended as an  aid in the diagnosis of influenza from Nasopharyngeal swab specimens and should not be used as a sole basis for treatment. Nasal washings and aspirates are unacceptable for Xpert Xpress SARS-CoV-2/FLU/RSV testing.  Fact Sheet for Patients: BloggerCourse.com  Fact Sheet for Healthcare Providers: SeriousBroker.it  This test is not yet approved or cleared by the [place] FDA and has been authorized for detection and/or diagnosis of SARS-CoV-2 by FDA under an Emergency Use Authorization (EUA). This EUA will remain in effect (meaning this test can be used) for the duration of the COVID-19 declaration under Section 564(b)(1) of the Act, 21 U.S.C. section 360bbb-3(b)(1), unless the authorization is terminated or revoked.     Resp Syncytial Virus by PCR NEGATIVE NEGATIVE Final    Comment: (NOTE) Fact Sheet for Patients: BloggerCourse.com  Fact Sheet for Healthcare Providers: SeriousBroker.it  This test is not yet approved or cleared by the [place] FDA and has been authorized for detection and/or diagnosis of SARS-CoV-2  by FDA under an Emergency Use Authorization (EUA). This EUA will remain in effect (meaning this test can be used) for the duration of the COVID-19 declaration under Section 564(b)(1) of the Act, 21 U.S.C. section 360bbb-3(b)(1), unless the authorization is terminated or revoked.  Performed at [facility], [address], [place], [place] [address]     Radiology Studies: No results found.    Taesha Goodell T. Daelen Belvedere [facility]  If 7PM-7AM, please contact night-coverage [email or web address] 02/22/2023, 2:34 PM

## 2023-02-22 NOTE — Evaluation (Signed)
Physical Therapy Evaluation Patient Details Name: Michael [name] MRN: [id number] DOB: 09-09-1955 Today's Date: 02/22/2023  History of Present Illness  68 y.o. male admitted on 02/21/23 due to SoB and dyspnea on exertion, both worsening over a few days. PMH significant for  chronic HFrEF, orthostatic hypotension with recurrent syncope, CKD stage III, coronary disease, COPD, tobacco abuse, basal cell carcinoma status post partial rhinectomy and maxillectomy 06/06/2017 at [facility], advanced colonic tubular adenoma, right carotid stenosis, hypertension, hyperlipidemia.  Clinical Impression  Patient is performing at functional baseline, no safety concerns noted throughout evaluation.  Pt reports no concerns with navigating steps at home and has friends at living facility who can assist as needed. Recommending OP PT for mild balance. Patient is safe to DC home with appropriate services, DME, and any other needs indicated.  PT to sign off at this time.  Please reconsult acute PT services if functional changes occur while admitted.  Thank you.        If plan is discharge home, recommend the following:     Can travel by private vehicle        Equipment Recommendations None recommended by PT  Recommendations for Other Services       Functional Status Assessment Patient has not had a recent decline in their functional status     Precautions / Restrictions Precautions Precautions: None Restrictions Weight Bearing Restrictions Per Provider Order: No      Mobility  Bed Mobility Overal bed mobility: Modified Independent             General bed mobility comments: No assist needed    Transfers Overall transfer level: Modified independent Equipment used: None               General transfer comment: independently with mild lateral instability, no LOB observed.    Ambulation/Gait Ambulation/Gait assistance: Modified independent (Device/Increase time) Gait Distance (Feet): 60  Feet Assistive device: None Gait Pattern/deviations: WFL(Within Functional Limits), Decreased stride length, Step-through pattern, Decreased step length - right, Decreased step length - left       General Gait Details: waddling gait pattern for 26ft @ mod independent  Stairs            Wheelchair Mobility     Tilt Bed    Modified Rankin (Stroke Patients Only)       Balance Overall balance assessment: Needs assistance Sitting-balance support: No upper extremity supported Sitting balance-Leahy Scale: Good Sitting balance - Comments: sitting EOB   Standing balance support: No upper extremity supported, During functional activity Standing balance-Leahy Scale: Fair Standing balance comment: mild concerns in standing, no falls risk                             Pertinent Vitals/Pain Pain Assessment Pain Assessment: No/denies pain    Home Living Family/patient expects to be discharged to:: Private residence Living Arrangements: Non-relatives/Friends Available Help at Discharge: Friend(s);Available PRN/intermittently Type of Home: House Home Access: Stairs to enter Entrance Stairs-Rails: Doctor, general practice of Steps: 2 steps in front, 6 steps in back Alternate Level Stairs-Number of Steps: full flight Home Layout: Multi-level Home Equipment: None      Prior Function Prior Level of Function : Independent/Modified Independent             Mobility Comments: Household and short distanced community ambulator without use of an AD, does not drive ADLs Comments: Independent     Extremity/Trunk Assessment   Upper  Extremity Assessment Upper Extremity Assessment: Defer to OT evaluation    Lower Extremity Assessment Lower Extremity Assessment: Overall WFL for tasks assessed    Cervical / Trunk Assessment Cervical / Trunk Assessment: Normal  Communication   Communication Communication: No apparent difficulties  Cognition Arousal:  Alert Behavior During Therapy: WFL for tasks assessed/performed Overall Cognitive Status: Within Functional Limits for tasks assessed                                          General Comments General comments (skin integrity, edema, etc.): VSS on RA    Exercises     Assessment/Plan    PT Assessment All further PT needs can be met in the next venue of care  PT Problem List Decreased strength;Decreased balance;Decreased activity tolerance       PT Treatment Interventions      PT Goals (Current goals can be found in the Care Plan section)  Acute Rehab PT Goals Patient Stated Goal: return home PT Goal Formulation: With patient Time For Goal Achievement: 02/24/23 Potential to Achieve Goals: Good    Frequency       Co-evaluation               AM-PAC PT "6 Clicks" Mobility  Outcome Measure Help needed turning from your back to your side while in a flat bed without using bedrails?: None Help needed moving from lying on your back to sitting on the side of a flat bed without using bedrails?: None Help needed moving to and from a bed to a chair (including a wheelchair)?: None Help needed standing up from a chair using your arms (e.g., wheelchair or bedside chair)?: None Help needed to walk in hospital room?: None Help needed climbing 3-5 steps with a railing? : None 6 Click Score: 24    End of Session Equipment Utilized During Treatment: Gait belt   Patient left: in chair;with call bell/phone within reach Nurse Communication: Mobility status PT Visit Diagnosis: Unsteadiness on feet (R26.81)    Time: 4696-2952 PT Time Calculation (min) (ACUTE ONLY): 9 min   Charges:   PT Evaluation $PT Eval Low Complexity: 1 Low   PT General Charges $$ ACUTE PT VISIT: 1 Visit         Elie Goody, DPT [facility], [facility] [place] [address] [phone] office  Nelida Meuse 02/22/2023, 2:56 PM

## 2023-02-22 NOTE — Progress Notes (Signed)
   02/22/23 1000  ReDS Vest / Clip  Station Marker D  Ruler Value 35  ReDS Value Range (!) > 40  ReDS Actual Value 43

## 2023-02-22 NOTE — Consult Note (Signed)
Cardiology Consultation   Patient ID: Michael [name] MRN: [id number]; DOB: 12-Aug-1955  Admit date: 02/21/2023 Date of Consult: 02/22/2023  PCP:  Benita Stabile, MD   [facility] Providers Cardiologist:  Nona Dell, MD        Patient Profile:   Michael [name] is a 68 y.o. male with a hx of chronic HFrEF (EF 30% in 10/2021, at 35-40% in 04/2022, 25-30% in 07/2022 and 25% in 11/2022), CAD (cath in 10/2021 showing mild to moderate obstructive CAD along mid-RCA and acute Mrg with medical management recommended), carotid artery stenosis (s/p right TCAR in 01/2021), iron deficiency anemia, HTN (complicated by orthostatic hypotension), HLD, COPD and Stage 3 CKD who is being seen 02/22/2023 for the evaluation of acute HFrEF at the request of Dr. Jarvis [name].  History of Present Illness:   Michael [name] was last examined by the cardiology service during admission in 11/2022 for syncope and orthostatics were nondiagnostic on admission. A Zio patch had previously been placed but he lost the monitor and was not felt to be a good candidate for an ICD given noncompliance with medical therapy. Medical therapy for his cardiomyopathy had been limited given his variable BP and he was continued on Coreg 12.5 mg twice daily. He had not been on an SGLT2 inhibitor given recurrent UTI's and it was recommended to consider resuming Losartan as an outpatient pending reassessment of his BP and renal function but he no showed for his follow-up visit.  Appears he did present to [facility] ED on 02/05/2023 as CODE STEMI had been called given ST elevations along the anterior leads. Upon arrival, his chest pain had resolved and EKG tracings overall looked similar to prior imaging. He was found to be in atrial fibrillation and was started on Eliquis 5 mg twice daily for anticoagulation and ASA was discontinued.  He presented back to [facility] ED on 02/21/2023 for evaluation of worsening shortness of breath over  the past 3 days. In talking with the patient today, he reports having worsening dyspnea at rest and with activity for the past 3 days. Notes that he has to sit up at night due to shortness of breath. He has been staying with friends but is sleeping in a bed. He denies any associated chest pain or palpitations. Questions if he is still in atrial fibrillation as he was overall asymptomatic with this per his report. Has noted some lower extremity edema. No abdominal distention. In reviewing his medications, he is unsure if he is taking Torsemide daily. Says he does not add salt to food but does consume frozen meals intermittently.  BP was elevated while in the ED, peaking at 174/93. Initial labs showing WBC 11.4, Hgb 11.0, platelets 400, Na+ 140, K+ 4.1 and creatinine 1.57 (improved from prior values in 11/2022). BNP elevated at greater than 4500. Initial and repeat hs Troponin flat at 63 and 53. Negative for COVID, influenza and RSV.  CXR showing chronic cardiomegaly and pulmonary hyperinflation with no acute cardiopulmonary abnormalities. CTA showed no evidence of a PE. Was noted to have cardiomegaly with small bilateral pleural effusions, aortic atherosclerosis, coronary artery calcification and cholelithiasis. EKG showing normal sinus rhythm, heart rate 76 with PVC's and TWI along the lateral leads.  He received 40 mg of IV Lasix while in the ED and was admitted for further management. Repeat labs today show creatinine has trended up slightly to 1.74. Recorded net output of -897 mL. Weight at 146 lbs which is below  his prior discharge weight of 159 lbs in 11/2022. Reports still having orthopnea and PND overnight.  Past Medical History:  Diagnosis Date   Aortic atherosclerosis (HCC) 08/03/2021   CAD (coronary artery disease) 08/03/2021   Cardiac catheterization September 2023 with RCA/RV marginal stenosis managed medically   Cardiomyopathy (HCC)    Carotid artery disease (HCC)    CKD (chronic kidney  disease) stage 3, GFR 30-59 ml/min (HCC)    COPD (chronic obstructive pulmonary disease) (HCC)    Essential hypertension    Head and neck cancer (HCC) 2019   Right facial basal cell carcinoma s/p resection with right partial mastectomy and partal rhinectomy with skin graft (06/13/17)   Iron deficiency anemia    Urinary retention     Past Surgical History:  Procedure Laterality Date   ANKLE CLOSED REDUCTION Right    open reduction   BASAL CELL CARCINOMA EXCISION  2019   at [facility]   BIOPSY  07/19/2021   Procedure: BIOPSY;  Surgeon: Lanelle Bal, DO;  [facility]: [facility];  Service: Endoscopy;;   CATARACT EXTRACTION W/PHACO Left 01/23/2023   Procedure: CATARACT EXTRACTION PHACO AND INTRAOCULAR LENS PLACEMENT (IOC);  Surgeon: [name], MD;  Location: [facility] ORS;  Service: Ophthalmology;  Laterality: Left;  CDE: 23.26   COLONOSCOPY N/A 05/31/2017   Procedure: COLONOSCOPY;  Surgeon: Corbin Ade, MD;  Location: [facility];  Service: Endoscopy;  Laterality: N/A;  2:45pm   COLONOSCOPY WITH PROPOFOL N/A 07/19/2021   Procedure: COLONOSCOPY WITH PROPOFOL;  Surgeon: Lanelle Bal, DO;  Location: [facility];  Service: Endoscopy;  Laterality: N/A;  3:00pm, moved up to 9:00   CYSTOSCOPY N/A 10/29/2018   Procedure: CYSTOSCOPY, CLOT EVACUATION;  Surgeon: Rene Paci, MD;  Location: [facility] ORS;  Service: Urology;  Laterality: N/A;   PARTIAL COLECTOMY Right 08/11/2021   Procedure: PARTIAL COLECTOMY, OPEN RIGHT HEMICOLECTOMY;  Surgeon: Lucretia Roers, MD;  Location: [facility] ORS;  Service: General;  Laterality: Right;   POLYPECTOMY  05/31/2017   Procedure: POLYPECTOMY;  Surgeon: Corbin Ade, MD;  [facility];  Service: Endoscopy;;   RIGHT/LEFT HEART CATH AND CORONARY ANGIOGRAPHY N/A 10/22/2021   Procedure: RIGHT/LEFT HEART CATH AND CORONARY ANGIOGRAPHY;  Surgeon: Orbie Pyo, MD;  Location: [facility] INV[facility] LAB;  Service: Cardiovascular;  Laterality: N/A;    TONSILLECTOMY     TRANSCAROTID ARTERY REVASCULARIZATION  Right 02/15/2021   Procedure: RIGHT TRANSCAROTID ARTERY REVASCULARIZATION;  Surgeon: Cephus Shelling, MD;  Location[facility] OR;  Service: Vascular;  Laterality: Right;   ULTRASOUND GUIDANCE FOR VASCULAR ACCESS Left 02/15/2021   Procedure: ULTRASOUND GUIDANCE FOR VASCULAR ACCESS;  Surgeon: Cephus Shelling, MD;  Location: [facility] OR;  Service: Vascular;  Laterality: Left;   XI ROBOTIC ASSISTED SIMPLE PROSTATECTOMY N/A 10/29/2018   Procedure: XI ROBOTIC ASSISTED SIMPLE PROSTATECTOMY;  Surgeon: Malen Gauze, MD;  Location: [facility] ORS;  Service: Urology;  Laterality: N/A;     Home Medications:  Prior to Admission medications   Medication Sig Start Date End Date Taking? Authorizing Provider  albuterol (VENTOLIN HFA) 108 (90 Base) MCG/ACT inhaler Inhale 2 puffs into the lungs every 6 (six) hours as needed for wheezing or shortness of breath. 04/27/22  Yes Shahmehdi, Gemma Payor, MD  apixaban (ELIQUIS) 5 MG TABS tablet Take 1 tablet (5 mg total) by mouth 2 (two) times daily. 02/05/23 04/06/23 Yes Loetta Rough, MD  aspirin EC 81 MG tablet Take 1 tablet (81 mg total) by mouth daily.  Swallow whole. 11/26/22  Yes Vassie Loll, MD  atorvastatin (LIPITOR) 40 MG tablet Take 1 tablet (40 mg total) by mouth every evening. Patient taking differently: Take 40 mg by mouth daily. 08/01/22 02/05/24 Yes Shahmehdi, Gemma Payor, MD  Budeson-Glycopyrrol-Formoterol (BREZTRI AEROSPHERE) 160-9-4.8 MCG/ACT AERO Inhale 2 puffs into the lungs 2 (two) times daily.   Yes [provider]  carvedilol (COREG) 12.5 MG tablet Take 1 tablet (12.5 mg total) by mouth 2 (two) times daily with a meal. 11/25/22  Yes Vassie Loll, MD  diltiazem (CARDIZEM) 30 MG tablet Take 1 tablet (30 mg total) by mouth once as needed for up to 30 doses (If heart rate consistently >110 bpm). 02/05/23  Yes Loetta Rough, MD  feeding supplement (ENSURE ENLIVE / ENSURE PLUS) LIQD Take 237 mLs by mouth 3  (three) times daily between meals. Patient taking differently: Take 237 mLs by mouth daily. 11/25/22  Yes Vassie Loll, MD  ILEVRO 0.3 % ophthalmic suspension Place 1 drop into both eyes in the morning and at bedtime. 12/12/22  Yes [provider]  losartan (COZAAR) 25 MG tablet Take 25 mg by mouth daily. 12/23/22  Yes [provider]  nitroGLYCERIN (NITROSTAT) 0.4 MG SL tablet Place 1 tablet (0.4 mg total) under the tongue every 5 (five) minutes as needed for chest pain. 02/05/23  Yes Loetta Rough, MD  pantoprazole (PROTONIX) 40 MG tablet Take 1 tablet (40 mg total) by mouth daily. 11/25/22 11/25/23 Yes Vassie Loll, MD  potassium chloride SA (KLOR-CON M) 20 MEQ tablet Take 1 tablet (20 mEq total) by mouth daily. 07/26/22 02/05/24 Yes Shahmehdi, Gemma Payor, MD  REPATHA SURECLICK 140 MG/ML SOAJ Inject 1 mL into the skin every 14 (fourteen) days. 10/19/22  Yes [provider]  torsemide (DEMADEX) 20 MG tablet Take 1 tablet (20 mg total) by mouth daily. 11/16/22  Yes Shahmehdi, Gemma Payor, MD    Inpatient Medications: Scheduled Meds:  apixaban  5 mg Oral BID   atorvastatin  40 mg Oral QPM   carvedilol  12.5 mg Oral BID WC   fluticasone furoate-vilanterol  1 puff Inhalation Daily   And   umeclidinium bromide  1 puff Inhalation Daily   losartan  25 mg Oral Daily   nepafenac  1 drop Both Eyes BID   potassium chloride SA  20 mEq Oral Daily   sodium chloride flush  3 mL Intravenous Q12H   Continuous Infusions:  PRN Meds: acetaminophen **OR** acetaminophen, hydrALAZINE, nitroGLYCERIN, mouth rinse  Allergies:   No Known Allergies  Social History:   Social History   Socioeconomic History   Marital status: Single    Spouse name: Not on file   Number of children: Not on file   Years of education: 12   Highest education level: 12th grade  Occupational History   Not on file  Tobacco Use   Smoking status: Former    Current packs/day: 0.00    Average packs/day: 0.5  packs/day for 40.0 years (20.0 ttl pk-yrs)    Types: Cigarettes    Start date: 07/02/1981    Quit date: 07/02/2021    Years since quitting: 1.6    Passive exposure: Past   Smokeless tobacco: Never   Tobacco comments:    Quit on 06/09/21  Vaping Use   Vaping status: Never Used  Substance and Sexual Activity   Alcohol use: Not Currently   Drug use: Never   Sexual activity: Not Currently  Other Topics Concern   Not on  file  Social History Narrative   Not on file   Social Drivers of Health   Financial Resource Strain: Low Risk  (12/26/2022)   Overall Financial Resource Strain (CARDIA)    Difficulty of Paying Living Expenses: Not hard at all  Food Insecurity: No Food Insecurity (02/21/2023)   Hunger Vital Sign    Worried About Running Out of Food in the Last Year: Never true    Ran Out of Food in the Last Year: Never true  Transportation Needs: No Transportation Needs (02/21/2023)   PRAPARE - Administrator, Civil Service (Medical): No    Lack of Transportation (Non-Medical): No  Physical Activity: Sufficiently Active (12/26/2022)   Exercise Vital Sign    Days of Exercise per Week: 5 days    Minutes of Exercise per Session: 50 min  Stress: No Stress Concern Present (12/26/2022)   [employer] of Occupational Health - Occupational Stress Questionnaire    Feeling of Stress : Only a little  Social Connections: Patient Declined (02/21/2023)   Social Connection and Isolation Panel [NHANES]    Frequency of Communication with Friends and Family: Patient declined    Frequency of Social Gatherings with Friends and Family: Patient declined    Attends Religious Services: Patient declined    Database administrator or Organizations: Patient declined    Attends Banker Meetings: Patient declined    Marital Status: Patient declined  Intimate Partner Violence: Not At Risk (02/21/2023)   Humiliation, Afraid, Rape, and Kick questionnaire    Fear of Current or  Ex-Partner: No    Emotionally Abused: No    Physically Abused: No    Sexually Abused: No    Family History:    Family History  Problem Relation Age of Onset   Stroke Father    Cirrhosis Mother    Colon cancer Neg Hx      ROS:  Please see the history of present illness.   All other ROS reviewed and negative.     Physical Exam/Data:   Vitals:   02/21/23 2053 02/22/23 0018 02/22/23 0335 02/22/23 0411  BP: (!) 155/123 (!) 142/96  (!) 167/92  Pulse: 76 72  72  Resp:  20  20  Temp:  99.2 F (37.3 C)  98.7 F (37.1 C)  TempSrc:  Oral  Oral  SpO2:  97%  98%  Weight:   66.6 kg   Height:        Intake/Output Summary (Last 24 hours) at 02/22/2023 0758 Last data filed at 02/22/2023 0300 Gross per 24 hour  Intake 3 ml  Output 900 ml  Net -897 ml      02/22/2023    3:35 AM 02/21/2023    3:15 PM 02/21/2023   11:49 AM  Last 3 Weights  Weight (lbs) 146 lb 13.2 oz 146 lb 13.2 oz 145 lb 15.1 oz  Weight (kg) 66.6 kg 66.6 kg 66.2 kg     Body mass index is 19.91 kg/m.  General: Thin male appearing in no acute distress HEENT: normal Neck: JVD elevated to jaw-line.  Vascular: No carotid bruits; Distal pulses 2+ bilaterally Cardiac:  normal S1, S2; RRR; no murmur  Lungs: Decreased breath sounds along bases bilaterally. Abd: soft, nontender, no hepatomegaly  Ext: 1+ pitting edema bilaterally Musculoskeletal:  No deformities, BUE and BLE strength normal and equal Skin: warm and dry  Neuro:  CNs 2-12 intact, no focal abnormalities noted Psych:  Normal affect   EKG:  The  EKG was personally reviewed and demonstrates: Normal sinus rhythm, heart rate 76 with PVCs and TWI along the lateral leads. Telemetry:  Telemetry was personally reviewed and demonstrates: Normal sinus rhythm, heart rate in 60s to 70s with frequent PVCs.  Relevant CV Studies:  Limited Echo: 11/2022 IMPRESSIONS     1. No change in severely reduced EF from TTE done 08/16/22 Limited echo No  mural apical  thrombus. Left ventricular ejection fraction, by estimation,  is 25%. The left ventricle demonstrates global hypokinesis. The left  ventricular internal cavity size was   severely dilated.   2. Right ventricular systolic function is mildly reduced. The right  ventricular size is mildly enlarged.   3. The mitral valve is abnormal. Mild mitral valve regurgitation.   Laboratory Data:  High Sensitivity Troponin:   Recent Labs  Lab 02/05/23 1840 02/05/23 2119 02/21/23 1053 02/21/23 1329  TROPONINIHS 109* 126* 63* 53*     Chemistry Recent Labs  Lab 02/21/23 1053 02/22/23 0427  NA 140 142  K 4.1 4.1  CL 109 109  CO2 25 25  GLUCOSE 112* 103*  BUN 22 24*  CREATININE 1.57* 1.74*  CALCIUM 8.7* 8.6*  GFRNONAA 48* 42*  ANIONGAP 6 8    Recent Labs  Lab 02/21/23 1053 02/22/23 0427  PROT 6.8 6.3*  ALBUMIN 3.1* 2.9*  AST 21 21  ALT 18 16  ALKPHOS 120 116  BILITOT 1.6* 1.3*   Lipids No results for input(s): "CHOL", "TRIG", "HDL", "LABVLDL", "LDLCALC", "CHOLHDL" in the last 168 hours.  Hematology Recent Labs  Lab 02/21/23 1053 02/22/23 0427  WBC 11.4* 10.6*  RBC 3.87* 3.88*  HGB 11.0* 10.9*  HCT 35.6* 36.2*  MCV 92.0 93.3  MCH 28.4 28.1  MCHC 30.9 30.1  RDW 16.6* 16.4*  PLT 400 371   Thyroid No results for input(s): "TSH", "FREET4" in the last 168 hours.  BNP Recent Labs  Lab 02/21/23 1053  BNP >4,500.0*    DDimer No results for input(s): "DDIMER" in the last 168 hours.   Radiology/Studies:  CT Angio Chest PE W/Cm &/Or Wo Cm Result Date: 02/21/2023 CLINICAL DATA:  Shortness of breath for 3 days.  History of COPD. EXAM: CT ANGIOGRAPHY CHEST WITH CONTRAST TECHNIQUE: Multidetector CT imaging of the chest was performed using the standard protocol during bolus administration of intravenous contrast. Multiplanar CT image reconstructions and MIPs were obtained to evaluate the vascular anatomy. RADIATION DOSE REDUCTION: This exam was performed according to the  departmental dose-optimization program which includes automated exposure control, adjustment of the mA and/or kV according to patient size and/or use of iterative reconstruction technique. CONTRAST:  OMNIPAQUE IOHEXOL 350 MG/ML SOLN COMPARISON:  Chest radiograph of earlier today. Chest CT of 11/22/2022. FINDINGS: Cardiovascular: The quality of this exam for evaluation of pulmonary embolism is good. No evidence of pulmonary embolism. Aortic atherosclerosis. Moderate cardiomegaly, with trace pericardial fluid, likely physiologic. Left main and 3 vessel coronary artery calcification. Pulmonary artery enlargement, outflow tract 3.6 cm. Mediastinum/Nodes: No mediastinal or hilar adenopathy. Lungs/Pleura: Small right pleural effusion is similar. Trace left pleural fluid is new. Mild centrilobular emphysema.  Scattered calcified granulomas. Upper Abdomen: Reflux of contrast into the hepatic veins and IVC. Right hepatic lobe low-density lesion is likely a cyst at 8 mm. Stone in the gallbladder neck of 2.1 cm. Gallbladder incompletely imaged. Possible intrahepatic biliary duct dilatation, incompletely imaged. Chronic calcific pancreatitis with chronic atrophy and duct dilatation as detailed on 10/20/2021 dedicated CT. Bilateral renal cortical thinning/atrophy. Musculoskeletal: No  acute osseous abnormality. Review of the MIP images confirms the above findings. IMPRESSION: 1.  No evidence of pulmonary embolism. 2. Cardiomegaly with small bilateral pleural effusions and reflux of contrast into the hepatic veins and IVC, suggesting elevated right heart pressures and possible mild congestive failure. 3. No other explanation for shortness of breath. 4. Aortic atherosclerosis (ICD10-I70.0), coronary artery atherosclerosis and emphysema (ICD10-J43.9). 5. Cholelithiasis. Possible intrahepatic biliary duct dilatation, suboptimally evaluated. If bilirubin levels are elevated, consider dedicated nonemergent outpatient MRCP. 6.  Pulmonary artery enlargement suggests pulmonary arterial hypertension. Electronically Signed   By: Jeronimo Greaves M.D.   On: 02/21/2023 13:44   DG Chest 2 View Result Date: 02/21/2023 CLINICAL DATA:  68 year old male with shortness of breath for 3 days. EXAM: CHEST - 2 VIEW COMPARISON:  Portable chest 02/05/2023 and earlier. FINDINGS: PA and lateral views 1125 hours. Chronic cardiomegaly and large lung volumes. Calcified aortic atherosclerosis. Stable cardiac size and mediastinal contours. Probable left lung nipple shadow, new since 02/05/2023 and and not correlated on CTA in October. Otherwise lung markings, pulmonary vascularity appears stable with no pneumothorax, pulmonary edema, pleural effusion or confluent lung opacity. Osteopenia. No acute osseous abnormality identified. Negative visible bowel gas. IMPRESSION: 1. Chronic cardiomegaly and pulmonary hyperinflation. Aortic Atherosclerosis (ICD10-I70.0). 2. Left nipple shadow suspected. No acute cardiopulmonary abnormality. Electronically Signed   By: Odessa Fleming M.D.   On: 02/21/2023 11:54     Assessment and Plan:   1. Acute HFrEF - He has a known cardiomyopathy with a EF at 25% by most recent imaging in 11/2022. Management has been challenging in the setting of medication noncompliance and also due to syncope and orthostasis in the past requiring dose adjustment of medical therapy. Also not felt to be an ideal candidate for ICD placement as discussed during admission in 11/2022. - BNP elevated at greater than 4500 on admission and CTA did show small bilateral pleural effusions. He still appears volume overloaded by examination today and will dose IV Lasix 40 mg daily x 1 and assess response along with rechecking renal function given this has been significantly variable over the past several months. Will also obtain a ReDS vest reading.  - In regards to GDMT, can continue Coreg 12.5 mg twice daily and Losartan 25 mg daily. BP previously did not allow for  Entresto and he has been off SGLT2 inhibitor therapy given recurrent UTI's. Pending his overall BP trend and renal function, could possibly add Spironolactone.  2. CAD/HLD - Prior cardiac catheterization 10/2021 showed mild to moderate CAD with medical management recommended. Plavix was discontinued during his hospitalization in 11/2022 with him remaining on ASA but prior hospital notes from 02/2023 mentioned that he was taking both ASA and Plavix and was recommend to stop ASA at that time. Given that he is now on Eliquis for anticoagulation and no recent PCI, anticipate he could just restart ASA 81 mg daily while continuing on Eliquis and remain off Plavix given his chronic anemia. Remains on Atorvastatin 40 mg daily and Coreg 12.5 mg twice daily. Also listed as being on Repatha prior to admission and LDL was at 18 when checked in 07/2022.  3. Paroxysmal Atrial Fibrillation - This was diagnosed earlier this month and he has converted back to normal sinus rhythm in the interim. Continue Coreg 12.5 mg twice daily for rate control. - Continue Eliquis 5 mg twice daily for anticoagulation which is the appropriate dose at this time given his age, weight and renal function.  4. Carotid Artery  Stenosis  - He did undergo right TCAR in 01/2021. Dopplers in 11/2022 showed no hemodynamically significant stenosis along the RICA and moderate to large amount of left-sided plaque which was similar to prior imaging from 2019.  5. Stage 3 CKD - Baseline creatinine 1.9 - 2.0. Was at 1.57 on admission and trending up to 1.74 today. Will dose IV Lasix as discussed above and reassess tomorrow before ordering additional diuretics.  6. Anemia - He has a history of iron deficiency anemia and hemoglobin is at 10.9 today. He was listed as being on Plavix and Eliquis prior to admission but Plavix was previously discontinued in 11/2022. Suspect he can just be on Eliquis and ASA alone given no recent coronary  interventions.  For questions or updates, please contact [facility] Please consult [email or web address] for contact info under    Signed, Ellsworth Lennox, PA-C  02/22/2023 7:58 AM

## 2023-02-23 ENCOUNTER — Ambulatory Visit: Payer: Self-pay | Admitting: *Deleted

## 2023-02-23 DIAGNOSIS — I4729 Other ventricular tachycardia: Secondary | ICD-10-CM | POA: Diagnosis not present

## 2023-02-23 DIAGNOSIS — I5023 Acute on chronic systolic (congestive) heart failure: Secondary | ICD-10-CM | POA: Diagnosis not present

## 2023-02-23 DIAGNOSIS — I779 Disorder of arteries and arterioles, unspecified: Secondary | ICD-10-CM | POA: Diagnosis not present

## 2023-02-23 DIAGNOSIS — R7989 Other specified abnormal findings of blood chemistry: Secondary | ICD-10-CM | POA: Diagnosis not present

## 2023-02-23 DIAGNOSIS — I480 Paroxysmal atrial fibrillation: Secondary | ICD-10-CM | POA: Diagnosis not present

## 2023-02-23 DIAGNOSIS — R55 Syncope and collapse: Secondary | ICD-10-CM | POA: Diagnosis not present

## 2023-02-23 DIAGNOSIS — I10 Essential (primary) hypertension: Secondary | ICD-10-CM | POA: Diagnosis not present

## 2023-02-23 LAB — CBC
HCT: 33.4 % — ABNORMAL LOW (ref 39.0–52.0)
Hemoglobin: 10.3 g/dL — ABNORMAL LOW (ref 13.0–17.0)
MCH: 27.6 pg (ref 26.0–34.0)
MCHC: 30.8 g/dL (ref 30.0–36.0)
MCV: 89.5 fL (ref 80.0–100.0)
Platelets: 334 10*3/uL (ref 150–400)
RBC: 3.73 MIL/uL — ABNORMAL LOW (ref 4.22–5.81)
RDW: 16.2 % — ABNORMAL HIGH (ref 11.5–15.5)
WBC: 10.1 10*3/uL (ref 4.0–10.5)
nRBC: 0 % (ref 0.0–0.2)

## 2023-02-23 LAB — RENAL FUNCTION PANEL
Albumin: 2.8 g/dL — ABNORMAL LOW (ref 3.5–5.0)
Anion gap: 9 (ref 5–15)
BUN: 23 mg/dL (ref 8–23)
CO2: 25 mmol/L (ref 22–32)
Calcium: 8.3 mg/dL — ABNORMAL LOW (ref 8.9–10.3)
Chloride: 104 mmol/L (ref 98–111)
Creatinine, Ser: 1.69 mg/dL — ABNORMAL HIGH (ref 0.61–1.24)
GFR, Estimated: 44 mL/min — ABNORMAL LOW (ref >60)
Glucose, Bld: 124 mg/dL — ABNORMAL HIGH (ref 70–99)
Phosphorus: 3.2 mg/dL (ref 2.5–4.6)
Potassium: 3 mmol/L — ABNORMAL LOW (ref 3.5–5.1)
Sodium: 138 mmol/L (ref 135–145)

## 2023-02-23 LAB — MAGNESIUM: Magnesium: 2 mg/dL (ref 1.7–2.4)

## 2023-02-23 MED ORDER — POTASSIUM CHLORIDE CRYS ER 20 MEQ PO TBCR
40.0000 meq | EXTENDED_RELEASE_TABLET | ORAL | Status: AC
  Administered 2023-02-23 (×2): 40 meq via ORAL
  Filled 2023-02-23 (×2): qty 2

## 2023-02-23 NOTE — Consult Note (Signed)
[facility] Liaison Consult Note   02/23/2023  Michael [name] April 13, 1955 [id number]  [facility] Liaison remote coverage for Michael [name], [facility] Liaison for patient admitted to [facility]  [facility] Patient:  Active in Care Coordination program  Primary Care Provider:  Benita Stabile, MD this provider is listed to provide the community transition of care follow up and VBCI TOC calls  Insurance: [employer] Dual Complete  Patient is currently active with [facility] for care coordination services.  Patient has been engaged by a Financial risk analyst and Child psychotherapist.  The community based plan of care has focused on disease management and community resource support.    Patient will receive a post hospital call and will be evaluated for assessments and disease process education and LCSW notes for higher level of care.  RNCC is aware of admission.  .    Plan: Continue to follow for any additional community care coordination needs for post hospital/community needs. Will update [facility] Liaison of admission and active status.    Of note, [facility] services does not replace or interfere with any services that are needed or arranged by inpatient [facility] care management team.   Charlesetta Shanks, RN, BSN, CCM [place]  [facility], [facility] Liaison Direct Dial: [phone] or secure chat Email: [email or web address]

## 2023-02-23 NOTE — Plan of Care (Signed)
  Problem: Education: Goal: Knowledge of General Education information will improve Description Including pain rating scale, medication(s)/side effects and non-pharmacologic comfort measures Outcome: Progressing   Problem: Health Behavior/Discharge Planning: Goal: Ability to manage health-related needs will improve Outcome: Progressing   

## 2023-02-23 NOTE — Progress Notes (Signed)
Mobility Specialist Progress Note:    02/23/23 1046  Orthostatic Lying   BP- Lying 154/88  Pulse- Lying 60  Orthostatic Sitting  BP- Sitting (!) 130/113  Pulse- Sitting 61  Orthostatic Standing at 0 minutes  BP- Standing at 0 minutes 136/74  Pulse- Standing at 0 minutes 62  Mobility  Activity Stood at bedside;Transferred from bed to chair  Level of Assistance Independent  Assistive Device None  Distance Ambulated (ft) 4 ft  Range of Motion/Exercises Active;All extremities  Activity Response Tolerated well  Mobility Referral Yes  Mobility visit 1 Mobility  Mobility Specialist Start Time (ACUTE ONLY) 1030  Mobility Specialist Stop Time (ACUTE ONLY) 1050  Mobility Specialist Time Calculation (min) (ACUTE ONLY) 20 min   Pt received in bed, agreeable to mobility. Independently able to stand and ambulate with no AD. Tolerated well, denies dizziness. Left pt in chair, call bell in reach. All needs met.   Lawerance Bach Mobility Specialist Please contact via Special educational needs teacher or  Rehab office at [phone]

## 2023-02-23 NOTE — Plan of Care (Signed)
?  Problem: Clinical Measurements: ?Goal: Will remain free from infection ?Outcome: Progressing ?Goal: Diagnostic test results will improve ?Outcome: Progressing ?Goal: Respiratory complications will improve ?Outcome: Progressing ?  ?

## 2023-02-23 NOTE — Progress Notes (Signed)
Patient up in chair at this time. Patient voiding without difficulty in urinal . No c/o pain or discomfort. He is able to make needs known. Appetite good this shift.  Call bell is within reach.

## 2023-02-23 NOTE — Progress Notes (Addendum)
PROGRESS NOTE  Michael [name] GMW:[id number] DOB: March 31, 1955   PCP: Benita Stabile, MD  Patient is from: Home.  DOA: 02/21/2023 LOS: 1  Chief complaints Chief Complaint  Patient presents with   Shortness of Breath     Brief Narrative / Interim history: 68 year old M with PMH of HFrEF, orthostatic hypotension, recurrent syncope, CKD-3, CAD, COPD, BCC s/p thyroidectomy and maxillectomy in 2019 at [facility], right carotid stenosis, HTN and tobacco use disorder presented to ED with problem as if shortness of breath, DOE, orthopnea and edema, and admitted with working diagnosis of acute on chronic HFrEF.  BNP > 4600.  Heart prominent JVD on presentation.  CT angio chest negative for PE but small pleural effusion.  Mild elevated troponin without significant delta.  Started on IV diuretics.  Cardiology consulted.  Cardiology increased IV Lasix.   Subjective: Seen and examined earlier this morning.  No major events overnight of this morning.  No complaints.  Reports improvement in his breathing.  Making urine but I&O was not captured well.  Creatinine improving.  Objective: Vitals:   02/22/23 2154 02/23/23 0551 02/23/23 0758 02/23/23 1000  BP:  (!) 141/96  (!) 145/75  Pulse:  62  (!) 58  Resp:  14    Temp:  98.4 F (36.9 C)  98.2 F (36.8 C)  TempSrc:  Oral  Oral  SpO2: 98% 99% 97% 100%  Weight:  62.7 kg    Height:        Examination:  GENERAL: No apparent distress.  Nontoxic. HEENT: MMM.  Vision and hearing grossly intact.  NECK: Supple.  Prominent JVD. RESP:  No IWOB.  Fair aeration bilaterally.  CVS:  RRR. Heart sounds normal.  ABD/GI/GU: BS+. Abd soft, NTND.  MSK/EXT:  Moves extremities. No apparent deformity. No edema.  SKIN: no apparent skin lesion or wound NEURO: Awake, alert and oriented appropriately.  No apparent focal neuro deficit. PSYCH: Calm. Normal affect.   Procedures:  None  Microbiology summarized: COVID-19, influenza and RSV PCR nonreactive  Assessment  and plan: Acute on chronic HFrEF: Presents with dyspnea, DOE, orthopnea and edema.  Likely due to medication none implants and fluid indiscretion.  Last echo 11/05/2022 w/LVEF 25%, mild decreased RV function, global HK, mild MR/TR. started on IV Lasix.  Seems to be diuresing well but I&O was not well capture.  Still with prominent JVD.  -Appreciate help by cardiology-continue IV Lasix to 80 mg twice daily -Strict intake and output, daily weight, renal functions and electrolytes -GDMT-Coreg, low-dose losartan    Syncope/history of orthostatic hypotension: Orthostatic vitals negative -Continue telemetry   Chronic COPD, emphysema: Stable. -Continue controllers -Continue prn albuterol neb.  -May consider changing Coreg to beta-1 selective beta-blocker   CKD stage IIIb:  Baseline Cr 1.8-2.0. Monitor in AM. Recent Labs    11/14/22 0428 11/15/22 0908 11/21/22 1501 11/23/22 0049 11/24/22 0421 11/25/22 0420 02/05/23 1840 02/21/23 1053 02/22/23 0427 02/23/23 0459  BUN 29* 36* 28* 39* 47* 50* 28* 22 24* 23  CREATININE 2.09* 2.41* 1.87* 2.20* 2.06* 1.93* 1.74* 1.57* 1.74* 1.69*  -Continue monitoring  Elevated troponin/type II MI/demand ischemia: Likely due to CHF exacerbation. -Manage CHF as above  History of AFib with RVR: Now rate controlled.  Per cardiology consult note 02/05/2023.  - Continue coreg and close   Right carotid stenosis: He is now following with Dr. Olga Coaster was not felt to be a candidate for carotid stenting and suboptimal candidate for endarterectomy. Plan is for medical therapy at this  point -Continue eliquis   Basal cell carcinoma of nasolabial fold 06/06/2017--right partial rhinectomy, resection of left intranasal lesion, skin graft @[facility]. Margins were clear on pathology.   Iron deficiency anemia/Advanced colonic adenoma: Colonoscopy 05/31/2017--Dr. Rourk. He was noted to have numerous polyps as well as a polypoid mass in the hepatic flexure to the cecum area,  tubular adenoma pathology. -He was referred to general surgery, urged him to follow up.    Hyperbilirubinemia: Primarily suspect congestive hepatopathy with proven contrast reflux into hepatic veins on CTA today. This is stable with otherwise normal LFTs and benign exam.    Cholelithiasis: Asymptomatic.  -Consider dedicated nonemergent outpatient MRCP.    Leukocytosis: No nidus of infection currently noted.  Resolved.  Hypokalemia: -Monitor replenish as appropriate   Body mass index is 18.75 kg/m.           DVT prophylaxis:   apixaban (ELIQUIS) tablet 5 mg  Code Status: Full code. Patient has DNR paper in computer but he doesn't recall expressing desire to be DNR ever Family Communication: None at bedside Level of care: Telemetry Status is: Inpatient The patient will remain inpatient because: Acute HFrEF   Final disposition: Likely home once medically stable Consultants:  Cardiology  55 minutes with more than 50% spent in reviewing records, counseling patient/family and coordinating care.   Sch Meds:  Scheduled Meds:  apixaban  5 mg Oral BID   atorvastatin  40 mg Oral QPM   carvedilol  12.5 mg Oral BID WC   fluticasone furoate-vilanterol  1 puff Inhalation Daily   And   umeclidinium bromide  1 puff Inhalation Daily   furosemide  80 mg Intravenous BID   losartan  25 mg Oral Daily   nepafenac  1 drop Both Eyes BID   potassium chloride SA  20 mEq Oral Daily   sodium chloride flush  3 mL Intravenous Q12H   Continuous Infusions: PRN Meds:.acetaminophen **OR** acetaminophen, hydrALAZINE, nitroGLYCERIN, mouth rinse  Antimicrobials: Anti-infectives (From admission, onward)    None        I have personally reviewed the following labs and images: CBC: Recent Labs  Lab 02/21/23 1053 02/22/23 0427 02/23/23 0459  WBC 11.4* 10.6* 10.1  HGB 11.0* 10.9* 10.3*  HCT 35.6* 36.2* 33.4*  MCV 92.0 93.3 89.5  PLT 400 371 334   BMP &GFR Recent Labs  Lab  02/21/23 1053 02/22/23 0427 02/23/23 0459  NA 140 142 138  K 4.1 4.1 3.0*  CL 109 109 104  CO2 25 25 25   GLUCOSE 112* 103* 124*  BUN 22 24* 23  CREATININE 1.57* 1.74* 1.69*  CALCIUM 8.7* 8.6* 8.3*  MG  --   --  2.0  PHOS  --   --  3.2   Estimated Creatinine Clearance: 37.1 mL/min (A) (by C-G formula based on SCr of 1.69 mg/dL (H)). Liver & Pancreas: Recent Labs  Lab 02/21/23 1053 02/22/23 0427 02/23/23 0459  AST 21 21  --   ALT 18 16  --   ALKPHOS 120 116  --   BILITOT 1.6* 1.3*  --   PROT 6.8 6.3*  --   ALBUMIN 3.1* 2.9* 2.8*   No results for input(s): "LIPASE", "AMYLASE" in the last 168 hours. No results for input(s): "AMMONIA" in the last 168 hours. Diabetic: No results for input(s): "HGBA1C" in the last 72 hours. Recent Labs  Lab 02/22/23 2134  GLUCAP 117*   Cardiac Enzymes: No results for input(s): "CKTOTAL", "CKMB", "CKMBINDEX", "TROPONINI" in the last 168  hours. No results for input(s): "PROBNP" in the last 8760 hours. Coagulation Profile: No results for input(s): "INR", "PROTIME" in the last 168 hours. Thyroid Function Tests: No results for input(s): "TSH", "T4TOTAL", "FREET4", "T3FREE", "THYROIDAB" in the last 72 hours. Lipid Profile: No results for input(s): "CHOL", "HDL", "LDLCALC", "TRIG", "CHOLHDL", "LDLDIRECT" in the last 72 hours. Anemia Panel: No results for input(s): "VITAMINB12", "FOLATE", "FERRITIN", "TIBC", "IRON", "RETICCTPCT" in the last 72 hours. Urine analysis:    Component Value Date/Time   COLORURINE YELLOW 11/21/2022 2100   APPEARANCEUR CLEAR 11/21/2022 2100   APPEARANCEUR Cloudy (A) 06/16/2021 0936   LABSPEC 1.017 11/21/2022 2100   LABSPEC 1.018 04/23/2011 1044   PHURINE 5.0 11/21/2022 2100   GLUCOSEU NEGATIVE 11/21/2022 2100   GLUCOSEU Negative 04/23/2011 1044   HGBUR NEGATIVE 11/21/2022 2100   BILIRUBINUR NEGATIVE 11/21/2022 2100   BILIRUBINUR Negative 06/16/2021 0936   BILIRUBINUR Negative 04/23/2011 1044   KETONESUR  NEGATIVE 11/21/2022 2100   PROTEINUR 100 (A) 11/21/2022 2100   NITRITE NEGATIVE 11/21/2022 2100   LEUKOCYTESUR NEGATIVE 11/21/2022 2100   LEUKOCYTESUR Trace 04/23/2011 1044   Sepsis Labs: Invalid input(s): "PROCALCITONIN", "LACTICIDVEN"  Microbiology: Recent Results (from the past 240 hours)  Resp panel by RT-PCR (RSV, Flu A&B, Covid) Anterior Nasal Swab     Status: None   Collection Time: 02/21/23 11:04 AM   Specimen: Anterior Nasal Swab  Result Value Ref Range Status   SARS Coronavirus 2 by RT PCR NEGATIVE NEGATIVE Final    Comment: (NOTE) SARS-CoV-2 target nucleic acids are NOT DETECTED.  The SARS-CoV-2 RNA is generally detectable in upper respiratory specimens during the acute phase of infection. The lowest concentration of SARS-CoV-2 viral copies this assay can detect is 138 copies/mL. A negative result does not preclude SARS-Cov-2 infection and should not be used as the sole basis for treatment or other patient management decisions. A negative result may occur with  improper specimen collection/handling, submission of specimen other than nasopharyngeal swab, presence of viral mutation(s) within the areas targeted by this assay, and inadequate number of viral copies(<138 copies/mL). A negative result must be combined with clinical observations, patient history, and epidemiological information. The expected result is Negative.  Fact Sheet for Patients:  BloggerCourse.com  Fact Sheet for Healthcare Providers:  SeriousBroker.it  This test is no t yet approved or cleared by the [place] FDA and  has been authorized for detection and/or diagnosis of SARS-CoV-2 by FDA under an Emergency Use Authorization (EUA). This EUA will remain  in effect (meaning this test can be used) for the duration of the COVID-19 declaration under Section 564(b)(1) of the Act, 21 U.S.C.section 360bbb-3(b)(1), unless the authorization is  terminated  or revoked sooner.       Influenza A by PCR NEGATIVE NEGATIVE Final   Influenza B by PCR NEGATIVE NEGATIVE Final    Comment: (NOTE) The Xpert Xpress SARS-CoV-2/FLU/RSV plus assay is intended as an aid in the diagnosis of influenza from Nasopharyngeal swab specimens and should not be used as a sole basis for treatment. Nasal washings and aspirates are unacceptable for Xpert Xpress SARS-CoV-2/FLU/RSV testing.  Fact Sheet for Patients: BloggerCourse.com  Fact Sheet for Healthcare Providers: SeriousBroker.it  This test is not yet approved or cleared by the [place] FDA and has been authorized for detection and/or diagnosis of SARS-CoV-2 by FDA under an Emergency Use Authorization (EUA). This EUA will remain in effect (meaning this test can be used) for the duration of the COVID-19 declaration under Section 564(b)(1) of the  Act, 21 U.S.C. section 360bbb-3(b)(1), unless the authorization is terminated or revoked.     Resp Syncytial Virus by PCR NEGATIVE NEGATIVE Final    Comment: (NOTE) Fact Sheet for Patients: BloggerCourse.com  Fact Sheet for Healthcare Providers: SeriousBroker.it  This test is not yet approved or cleared by the [place] FDA and has been authorized for detection and/or diagnosis of SARS-CoV-2 by FDA under an Emergency Use Authorization (EUA). This EUA will remain in effect (meaning this test can be used) for the duration of the COVID-19 declaration under Section 564(b)(1) of the Act, 21 U.S.C. section 360bbb-3(b)(1), unless the authorization is terminated or revoked.  Performed at [facility] [address], [place], [place] [address]     Radiology Studies: No results found.    Emmagrace Runkel T. Cherika Jessie [facility]  If 7PM-7AM, please contact night-coverage [email or web address] 02/23/2023, 2:40 PM

## 2023-02-23 NOTE — Patient Outreach (Signed)
  Care Coordination   02/23/2023 Name: Michael [name] MRN: [id number] DOB: 05-17-1955   Care Coordination Outreach Attempts:  An unsuccessful outreach was attempted for an appointment today.  Follow Up Plan:  Additional outreach attempts will be made to offer the patient complex care management information and services.   Encounter Outcome:  No Answer noted after outreach and no answer to have 02/21/23 admission for hypertension & congestive Heart Failure (CHF)   Care Coordination Interventions:  Yes, provided    [name] L. Noelle Penner, RN, BSN, CCM [facility], [facility] RN Care Manager Direct Dial: [phone]  Fax: [phone] Mailing Address: [address]  [place] [address] Website: [email or web address]

## 2023-02-23 NOTE — Progress Notes (Addendum)
Rounding Note    Patient Name: Michael [name] Date of Encounter: 02/23/2023  [facility] Cardiologist: Nona Dell, MD   Subjective   Says that his breathing has significantly improved. No chest pain or palpitations. Did not sleep well last night as he says he has never been able to sleep in the hospital.   Inpatient Medications    Scheduled Meds:  apixaban  5 mg Oral BID   atorvastatin  40 mg Oral QPM   carvedilol  12.5 mg Oral BID WC   fluticasone furoate-vilanterol  1 puff Inhalation Daily   And   umeclidinium bromide  1 puff Inhalation Daily   furosemide  80 mg Intravenous BID   losartan  25 mg Oral Daily   nepafenac  1 drop Both Eyes BID   potassium chloride SA  20 mEq Oral Daily   potassium chloride  40 mEq Oral Q4H   sodium chloride flush  3 mL Intravenous Q12H   Continuous Infusions:  PRN Meds: acetaminophen **OR** acetaminophen, hydrALAZINE, nitroGLYCERIN, mouth rinse   Vital Signs    Vitals:   02/22/23 2130 02/22/23 2154 02/23/23 0551 02/23/23 0758  BP: 136/76  (!) 141/96   Pulse: 66  62   Resp: 20  14   Temp: 98.8 F (37.1 C)  98.4 F (36.9 C)   TempSrc: Oral  Oral   SpO2: 91% 98% 99% 97%  Weight:   62.7 kg   Height:        Intake/Output Summary (Last 24 hours) at 02/23/2023 0844 Last data filed at 02/23/2023 0500 Gross per 24 hour  Intake 960 ml  Output 300 ml  Net 660 ml      02/23/2023    5:51 AM 02/22/2023    3:35 AM 02/21/2023    3:15 PM  Last 3 Weights  Weight (lbs) 138 lb 3.7 oz 146 lb 13.2 oz 146 lb 13.2 oz  Weight (kg) 62.7 kg 66.6 kg 66.6 kg      Telemetry    NSR, HR in 60's to 70's. Frequent PAC's and PVC's with an episode of NSVT for 8 beats. Did have narrow-complex tachycardia this morning around 0600 which lasted less than 2 minutes.  - Personally Reviewed  ECG    No new tracings.   Physical Exam   GEN: No acute distress.   Neck: JVD mildly elevated.  Cardiac: RRR, no murmurs, rubs, or gallops.   Respiratory: Rales along right base.  GI: Soft, nontender, non-distended  MS: No pitting edema; No deformity. Neuro:  Nonfocal  Psych: Normal affect   Labs    High Sensitivity Troponin:   Recent Labs  Lab 02/05/23 1840 02/05/23 2119 02/21/23 1053 02/21/23 1329  TROPONINIHS 109* 126* 63* 53*     Chemistry Recent Labs  Lab 02/21/23 1053 02/22/23 0427 02/23/23 0459  NA 140 142 138  K 4.1 4.1 3.0*  CL 109 109 104  CO2 25 25 25   GLUCOSE 112* 103* 124*  BUN 22 24* 23  CREATININE 1.57* 1.74* 1.69*  CALCIUM 8.7* 8.6* 8.3*  MG  --   --  2.0  PROT 6.8 6.3*  --   ALBUMIN 3.1* 2.9* 2.8*  AST 21 21  --   ALT 18 16  --   ALKPHOS 120 116  --   BILITOT 1.6* 1.3*  --   GFRNONAA 48* 42* 44*  ANIONGAP 6 8 9     Lipids No results for input(s): "CHOL", "TRIG", "HDL", "LABVLDL", "LDLCALC", "CHOLHDL" in  the last 168 hours.  Hematology Recent Labs  Lab 02/21/23 1053 02/22/23 0427 02/23/23 0459  WBC 11.4* 10.6* 10.1  RBC 3.87* 3.88* 3.73*  HGB 11.0* 10.9* 10.3*  HCT 35.6* 36.2* 33.4*  MCV 92.0 93.3 89.5  MCH 28.4 28.1 27.6  MCHC 30.9 30.1 30.8  RDW 16.6* 16.4* 16.2*  PLT 400 371 334   Thyroid No results for input(s): "TSH", "FREET4" in the last 168 hours.  BNP Recent Labs  Lab 02/21/23 1053  BNP >4,500.0*    DDimer No results for input(s): "DDIMER" in the last 168 hours.   Radiology    CT Angio Chest PE W/Cm &/Or Wo Cm Result Date: 02/21/2023 CLINICAL DATA:  Shortness of breath for 3 days.  History of COPD. EXAM: CT ANGIOGRAPHY CHEST WITH CONTRAST TECHNIQUE: Multidetector CT imaging of the chest was performed using the standard protocol during bolus administration of intravenous contrast. Multiplanar CT image reconstructions and MIPs were obtained to evaluate the vascular anatomy. RADIATION DOSE REDUCTION: This exam was performed according to the departmental dose-optimization program which includes automated exposure control, adjustment of the mA and/or kV according to  patient size and/or use of iterative reconstruction technique. CONTRAST:  OMNIPAQUE IOHEXOL 350 MG/ML SOLN COMPARISON:  Chest radiograph of earlier today. Chest CT of 11/22/2022. FINDINGS: Cardiovascular: The quality of this exam for evaluation of pulmonary embolism is good. No evidence of pulmonary embolism. Aortic atherosclerosis. Moderate cardiomegaly, with trace pericardial fluid, likely physiologic. Left main and 3 vessel coronary artery calcification. Pulmonary artery enlargement, outflow tract 3.6 cm. Mediastinum/Nodes: No mediastinal or hilar adenopathy. Lungs/Pleura: Small right pleural effusion is similar. Trace left pleural fluid is new. Mild centrilobular emphysema.  Scattered calcified granulomas. Upper Abdomen: Reflux of contrast into the hepatic veins and IVC. Right hepatic lobe low-density lesion is likely a cyst at 8 mm. Stone in the gallbladder neck of 2.1 cm. Gallbladder incompletely imaged. Possible intrahepatic biliary duct dilatation, incompletely imaged. Chronic calcific pancreatitis with chronic atrophy and duct dilatation as detailed on 10/20/2021 dedicated CT. Bilateral renal cortical thinning/atrophy. Musculoskeletal: No acute osseous abnormality. Review of the MIP images confirms the above findings. IMPRESSION: 1.  No evidence of pulmonary embolism. 2. Cardiomegaly with small bilateral pleural effusions and reflux of contrast into the hepatic veins and IVC, suggesting elevated right heart pressures and possible mild congestive failure. 3. No other explanation for shortness of breath. 4. Aortic atherosclerosis (ICD10-I70.0), coronary artery atherosclerosis and emphysema (ICD10-J43.9). 5. Cholelithiasis. Possible intrahepatic biliary duct dilatation, suboptimally evaluated. If bilirubin levels are elevated, consider dedicated nonemergent outpatient MRCP. 6. Pulmonary artery enlargement suggests pulmonary arterial hypertension. Electronically Signed   By: Jeronimo Greaves M.D.   On:  02/21/2023 13:44   DG Chest 2 View Result Date: 02/21/2023 CLINICAL DATA:  68 year old male with shortness of breath for 3 days. EXAM: CHEST - 2 VIEW COMPARISON:  Portable chest 02/05/2023 and earlier. FINDINGS: PA and lateral views 1125 hours. Chronic cardiomegaly and large lung volumes. Calcified aortic atherosclerosis. Stable cardiac size and mediastinal contours. Probable left lung nipple shadow, new since 02/05/2023 and and not correlated on CTA in October. Otherwise lung markings, pulmonary vascularity appears stable with no pneumothorax, pulmonary edema, pleural effusion or confluent lung opacity. Osteopenia. No acute osseous abnormality identified. Negative visible bowel gas. IMPRESSION: 1. Chronic cardiomegaly and pulmonary hyperinflation. Aortic Atherosclerosis (ICD10-I70.0). 2. Left nipple shadow suspected. No acute cardiopulmonary abnormality. Electronically Signed   By: Odessa Fleming M.D.   On: 02/21/2023 11:54    Cardiac Studies  Limited Echo: 11/2022 IMPRESSIONS     1. No change in severely reduced EF from TTE done 08/16/22 Limited echo No  mural apical thrombus. Left ventricular ejection fraction, by estimation,  is 25%. The left ventricle demonstrates global hypokinesis. The left  ventricular internal cavity size was   severely dilated.   2. Right ventricular systolic function is mildly reduced. The right  ventricular size is mildly enlarged.   3. The mitral valve is abnormal. Mild mitral valve regurgitation.    Patient Profile     68 y.o. male w/ PMH of chronic HFrEF (EF 30% in 10/2021, at 35-40% in 04/2022, 25-30% in 07/2022 and 25% in 11/2022), CAD (cath in 10/2021 showing mild to moderate obstructive CAD along mid-RCA and acute Mrg with medical management recommended), carotid artery stenosis (s/p right TCAR in 01/2021), iron deficiency anemia, HTN (complicated by orthostatic hypotension), HLD, COPD and Stage 3 CKD who is currently admitted for an acute CHF exacerbation.    Assessment & Plan    1. Acute HFrEF - He has a known cardiomyopathy with a EF at 25% by most recent imaging in 11/2022 and initiation of GDMT has been challenging given intermittent compliance and syncopal episodes in the setting of orthostatic hypotension. Previously not felt to be an ICD candidate given noncompliance.  - BNP elevated at greater than 4500 on admission and CTA did show small bilateral pleural effusions. ReDS Vest at 43 on 1/23 and IV Lasix was increased to 80mg  BID yesterday. Strict I&O's not recorded but multiple urine occurrences listed and weight is listed as having declined by 8 lbs from 146 lbs to 138 lbs. Will ask for a standing weight to be checked for accuracy. Recheck ReDS today. Volume status has significantly improved by examination.  - Remains on Coreg 12.5 mg twice daily and Losartan 25 mg daily. Previously had worsening orthostasis with Entresto and had recurrent UTI's with SGLT2 inhibitors. Can possibly add Spironolactone prior to discharge.    2. CAD/HLD - Prior cardiac catheterization 10/2021 showed mild to moderate CAD with medical management recommended. Plavix was discontinued during his hospitalization in 11/2022. Will review with MD in regards to restarting ASA or not as he was listed as being on this PTA. Continue Repatha (LDL at 18 in 07/2022), Atorvastatin and Coreg.    3. Paroxysmal Atrial Fibrillation - This was diagnosed earlier this month and he has converted back to normal sinus rhythm in the interim. He did have brief NCT this morning but lasted for less than 2 minutes. Would address electrolyte abnormalities as discussed below. Continue Coreg 12.5 mg twice daily for rate control. - Remains on Eliquis 5mg  BID for anticoagulation. Will require dose adjustment is weight becomes less than 133 lbs given his CKD.    4. Carotid Artery Stenosis  - He is s/p right TCAR in 01/2021. Dopplers in 11/2022 showed no hemodynamically significant stenosis along the  RICA and moderate to large amount of left-sided plaque which was similar to prior imaging from 2019. Remains on statin therapy.    5. Stage 3 CKD - Baseline creatinine 1.9 - 2.0. Was at 1.57 on admission and at 1.69 today. Continue to follow with diuresis.    6. Anemia - He has a history of iron deficiency anemia and hemoglobin is at 10.3 today. Remains on Eliquis as Plavix was discontinued during prior admission.   7. Hypokalemia - K+ 3.0. Replacement has already been ordered by the admitting team. Keep K+ ~ 4.0.  For questions or  updates, please contact [facility] Please consult [email or web address] for contact info under        Signed, Ellsworth Lennox, PA-C  02/23/2023, 8:44 AM

## 2023-02-24 DIAGNOSIS — R7989 Other specified abnormal findings of blood chemistry: Secondary | ICD-10-CM | POA: Diagnosis not present

## 2023-02-24 DIAGNOSIS — I10 Essential (primary) hypertension: Secondary | ICD-10-CM | POA: Diagnosis not present

## 2023-02-24 DIAGNOSIS — R55 Syncope and collapse: Secondary | ICD-10-CM | POA: Diagnosis not present

## 2023-02-24 DIAGNOSIS — I428 Other cardiomyopathies: Secondary | ICD-10-CM | POA: Diagnosis not present

## 2023-02-24 DIAGNOSIS — I480 Paroxysmal atrial fibrillation: Secondary | ICD-10-CM | POA: Diagnosis not present

## 2023-02-24 DIAGNOSIS — I5023 Acute on chronic systolic (congestive) heart failure: Secondary | ICD-10-CM | POA: Diagnosis not present

## 2023-02-24 DIAGNOSIS — I779 Disorder of arteries and arterioles, unspecified: Secondary | ICD-10-CM | POA: Diagnosis not present

## 2023-02-24 LAB — RENAL FUNCTION PANEL
Albumin: 3 g/dL — ABNORMAL LOW (ref 3.5–5.0)
Anion gap: 8 (ref 5–15)
BUN: 28 mg/dL — ABNORMAL HIGH (ref 8–23)
CO2: 26 mmol/L (ref 22–32)
Calcium: 8.6 mg/dL — ABNORMAL LOW (ref 8.9–10.3)
Chloride: 102 mmol/L (ref 98–111)
Creatinine, Ser: 1.85 mg/dL — ABNORMAL HIGH (ref 0.61–1.24)
GFR, Estimated: 39 mL/min — ABNORMAL LOW (ref >60)
Glucose, Bld: 102 mg/dL — ABNORMAL HIGH (ref 70–99)
Phosphorus: 3.5 mg/dL (ref 2.5–4.6)
Potassium: 3.4 mmol/L — ABNORMAL LOW (ref 3.5–5.1)
Sodium: 136 mmol/L (ref 135–145)

## 2023-02-24 LAB — MAGNESIUM: Magnesium: 2.1 mg/dL (ref 1.7–2.4)

## 2023-02-24 LAB — CBC
HCT: 36 % — ABNORMAL LOW (ref 39.0–52.0)
Hemoglobin: 11.3 g/dL — ABNORMAL LOW (ref 13.0–17.0)
MCH: 28.3 pg (ref 26.0–34.0)
MCHC: 31.4 g/dL (ref 30.0–36.0)
MCV: 90 fL (ref 80.0–100.0)
Platelets: 377 10*3/uL (ref 150–400)
RBC: 4 MIL/uL — ABNORMAL LOW (ref 4.22–5.81)
RDW: 16.4 % — ABNORMAL HIGH (ref 11.5–15.5)
WBC: 11.2 10*3/uL — ABNORMAL HIGH (ref 4.0–10.5)
nRBC: 0 % (ref 0.0–0.2)

## 2023-02-24 MED ORDER — FUROSEMIDE 40 MG PO TABS
40.0000 mg | ORAL_TABLET | Freq: Two times a day (BID) | ORAL | Status: DC
Start: 2023-02-25 — End: 2023-02-25
  Administered 2023-02-25: 40 mg via ORAL
  Filled 2023-02-24: qty 1

## 2023-02-24 MED ORDER — POTASSIUM CHLORIDE CRYS ER 20 MEQ PO TBCR
40.0000 meq | EXTENDED_RELEASE_TABLET | ORAL | Status: AC
  Administered 2023-02-24 (×2): 40 meq via ORAL
  Filled 2023-02-24 (×2): qty 2

## 2023-02-24 NOTE — Progress Notes (Signed)
Progress Note  Patient Name: Michael [name] Date of Encounter: 02/24/2023  Primary Cardiologist: Nona Dell, MD  Subjective   No SOB. Breathing improved. Leg swelling resolved.  Inpatient Medications    Scheduled Meds:  apixaban  5 mg Oral BID   atorvastatin  40 mg Oral QPM   carvedilol  12.5 mg Oral BID WC   fluticasone furoate-vilanterol  1 puff Inhalation Daily   And   umeclidinium bromide  1 puff Inhalation Daily   [START ON 02/25/2023] furosemide  40 mg Oral BID   losartan  25 mg Oral Daily   potassium chloride SA  20 mEq Oral Daily   potassium chloride  40 mEq Oral Q3H   sodium chloride flush  3 mL Intravenous Q12H   Continuous Infusions:  PRN Meds: acetaminophen **OR** acetaminophen, hydrALAZINE, nitroGLYCERIN, mouth rinse   Vital Signs    Vitals:   02/23/23 1700 02/23/23 2005 02/24/23 0435 02/24/23 0449  BP:  125/67 (!) 152/91   Pulse: 60 (!) 57 60   Resp:  16 16   Temp:  98.4 F (36.9 C) 98.2 F (36.8 C)   TempSrc:  Oral Oral   SpO2:  100% 100%   Weight:    61.6 kg  Height:        Intake/Output Summary (Last 24 hours) at 02/24/2023 0929 Last data filed at 02/24/2023 0928 Gross per 24 hour  Intake 1200 ml  Output 3700 ml  Net -2500 ml   Filed Weights   02/22/23 0335 02/23/23 0551 02/24/23 0449  Weight: 66.6 kg 62.7 kg 61.6 kg    Telemetry     Personally reviewed.NSR and PVCs  ECG    Not performed today  Physical Exam   GEN: No acute distress.   Neck: No JVD. Cardiac: RRR, no murmur, rub, or gallop.  Respiratory: Nonlabored. Clear to auscultation bilaterally. GI: Soft, nontender, bowel sounds present. MS: No edema; No deformity. Neuro:  Nonfocal. Psych: Alert and oriented x 3. Normal affect.  Labs    Chemistry Recent Labs  Lab 02/21/23 1053 02/22/23 0427 02/23/23 0459 02/24/23 0444  NA 140 142 138 136  K 4.1 4.1 3.0* 3.4*  CL 109 109 104 102  CO2 25 25 25 26   GLUCOSE 112* 103* 124* 102*  BUN 22 24* 23 28*   CREATININE 1.57* 1.74* 1.69* 1.85*  CALCIUM 8.7* 8.6* 8.3* 8.6*  PROT 6.8 6.3*  --   --   ALBUMIN 3.1* 2.9* 2.8* 3.0*  AST 21 21  --   --   ALT 18 16  --   --   ALKPHOS 120 116  --   --   BILITOT 1.6* 1.3*  --   --   GFRNONAA 48* 42* 44* 39*  ANIONGAP 6 8 9 8      Hematology Recent Labs  Lab 02/22/23 0427 02/23/23 0459 02/24/23 0444  WBC 10.6* 10.1 11.2*  RBC 3.88* 3.73* 4.00*  HGB 10.9* 10.3* 11.3*  HCT 36.2* 33.4* 36.0*  MCV 93.3 89.5 90.0  MCH 28.1 27.6 28.3  MCHC 30.1 30.8 31.4  RDW 16.4* 16.2* 16.4*  PLT 371 334 377    Cardiac Enzymes Recent Labs  Lab 02/05/23 1840 02/05/23 2119 02/21/23 1053 02/21/23 1329  TROPONINIHS 109* 126* 63* 53*    BNP Recent Labs  Lab 02/21/23 1053  BNP >4,500.0*     DDimerNo results for input(s): "DDIMER" in the last 168 hours.   Radiology    No results found.   Assessment &  Plan    Acute on chronic systolic heart failure likely secondary to medication/dietary noncompliance, near compensated NICM LVEF 25% with no device Paroxysmal A-fib, new onset in Jan 2025 Mild to moderate CAD Carotid artery stenosis s/p TCAR in 2023 Stage III CKD, baseline 1.9   -Appears to be compensated.  SOB and leg swelling improved.  He received IV Lasix 80 mg dose this morning.  Will discontinue IV Lasix and start p.o. Lasix 40 mg twice daily tomorrow.  Continue treatment and GDMT, carvedilol 12.5 mg twice daily, losartan 25 mg once daily, atorvastatin 40 mg nightly and Eliquis 5 mg twice daily.   Signed, Marjo Bicker, MD  02/24/2023, 9:29 AM

## 2023-02-24 NOTE — Plan of Care (Signed)
Problem: Nutrition: Goal: Adequate nutrition will be maintained Outcome: Progressing

## 2023-02-24 NOTE — Care Management Important Message (Signed)
Important Message  Patient Details  Name: Michael [name] MRN: [id number] Date of Birth: 1955/08/26   Important Message Given:  Yes - Medicare IM     Corey Harold 02/24/2023, 3:10 PM

## 2023-02-24 NOTE — Progress Notes (Signed)
Tele called at 1305, stated patient had an 8 run vtach. Sent message via epic chat to Dr Alanda Slim, no new orders at this time. Will continue to monitor.

## 2023-02-24 NOTE — Progress Notes (Signed)
   02/24/23 1100  ReDS Vest / Clip  Station Marker D  Ruler Value 33  ReDS Value Range (!) > 40  ReDS Actual Value 43

## 2023-02-24 NOTE — Progress Notes (Signed)
PROGRESS NOTE  Michael [name] ZOX:[id number] DOB: Feb 28, 1955   PCP: Benita Stabile, MD  Patient is from: Home.  DOA: 02/21/2023 LOS: 2  Chief complaints Chief Complaint  Patient presents with   Shortness of Breath     Brief Narrative / Interim history: 68 year old M with PMH of HFrEF, orthostatic hypotension, recurrent syncope, CKD-3, CAD, COPD, BCC s/p thyroidectomy and maxillectomy in 2019 at [facility], right carotid stenosis, HTN and tobacco use disorder presented to ED with problem as if shortness of breath, DOE, orthopnea and edema, and admitted with working diagnosis of acute on chronic HFrEF.  BNP > 4600.  Heart prominent JVD on presentation.  CT angio chest negative for PE but small pleural effusion.  Mild elevated troponin without significant delta.  Started on IV diuretics.  Cardiology consulted.  Patient was diuresed with IV Lasix under cardiology guidance.  Subjective: Seen and examined earlier this morning.  No major events overnight of this morning.  Reports improvement in his breathing.  No specific complaints other than difficulty sleeping in the hospital.  Objective: Vitals:   02/24/23 0435 02/24/23 0449 02/24/23 0936 02/24/23 1336  BP: (!) 152/91   137/76  Pulse: 60   (!) 51  Resp: 16   17  Temp: 98.2 F (36.8 C)   98 F (36.7 C)  TempSrc: Oral   Oral  SpO2: 100%  97% 96%  Weight:  61.6 kg    Height:        Examination:  GENERAL: No apparent distress.  Nontoxic. HEENT: MMM.  Vision and hearing grossly intact.  NECK: Supple.  No significant JVD but sitting upright. RESP:  No IWOB.  Fair aeration bilaterally.  CVS:  RRR. Heart sounds normal.  ABD/GI/GU: BS+. Abd soft, NTND.  MSK/EXT:  Moves extremities. No apparent deformity. No edema.  SKIN: no apparent skin lesion or wound NEURO: Awake, alert and oriented appropriately.  No apparent focal neuro deficit. PSYCH: Calm. Normal affect.   Procedures:  None  Microbiology summarized: COVID-19, influenza  and RSV PCR nonreactive  Assessment and plan: Acute on chronic HFrEF: Presents with dyspnea, DOE, orthopnea and edema.  Likely due to medication none implants and fluid indiscretion.  Last echo 11/05/2022 w/LVEF 25%, mild decreased RV function, global HK, mild MR/TR. started on IV Lasix.  I&O was not well capture.  About 3.7 L UOP/24 hours.  Net -2.7 L. Cr and BP stable. -Appreciate help by cardiology-transition to p.o. Lasix on 1/25.  Received IV Lasix this morning. -Strict intake and output, daily weight, renal functions and electrolytes -GDMT-Coreg, low-dose losartan    Syncope/history of orthostatic hypotension: Orthostatic vitals negative -Continue telemetry   Chronic COPD, emphysema: Stable. -Continue controllers -Continue prn albuterol neb.  -May consider changing Coreg to beta-1 selective beta-blocker   CKD stage IIIb:  Baseline Cr 1.8-2.0.  Recent Labs    11/15/22 0908 11/21/22 1501 11/23/22 0049 11/24/22 0421 11/25/22 0420 02/05/23 1840 02/21/23 1053 02/22/23 0427 02/23/23 0459 02/24/23 0444  BUN 36* 28* 39* 47* 50* 28* 22 24* 23 28*  CREATININE 2.41* 1.87* 2.20* 2.06* 1.93* 1.74* 1.57* 1.74* 1.69* 1.85*  -Continue monitoring  Elevated troponin/type II MI/demand ischemia: Likely due to CHF exacerbation. -Manage CHF as above  History of AFib with RVR: Now rate controlled.  Per cardiology consult note 02/05/2023.  - Continue coreg and close   Right carotid stenosis: He is now following with Dr. Olga Coaster was not felt to be a candidate for carotid stenting and suboptimal candidate for endarterectomy.  Plan is for medical therapy at this point -Continue eliquis   Basal cell carcinoma of nasolabial fold 06/06/2017--right partial rhinectomy, resection of left intranasal lesion, skin graft @[facility]. Margins were clear on pathology.   Iron deficiency anemia/Advanced colonic adenoma: Colonoscopy 05/31/2017--Dr. Rourk. He was noted to have numerous polyps as well as a polypoid  mass in the hepatic flexure to the cecum area, tubular adenoma pathology. -He was referred to general surgery, urged him to follow up.    Hyperbilirubinemia: Primarily suspect congestive hepatopathy with proven contrast reflux into hepatic veins on CTA today. This is stable with otherwise normal LFTs and benign exam.    Cholelithiasis: Asymptomatic.  -Consider dedicated nonemergent outpatient MRCP.    Leukocytosis: No nidus of infection currently noted.  Resolved.  Hypokalemia: -Monitor replenish as appropriate   Body mass index is 18.42 kg/m.           DVT prophylaxis:   apixaban (ELIQUIS) tablet 5 mg  Code Status: Full code. Patient has DNR paper in computer but he doesn't recall expressing desire to be DNR ever Family Communication: None at bedside Level of care: Telemetry Status is: Inpatient The patient will remain inpatient because: Acute HFrEF   Final disposition: Likely home on 1/25. Consultants:  Cardiology  55 minutes with more than 50% spent in reviewing records, counseling patient/family and coordinating care.   Sch Meds:  Scheduled Meds:  apixaban  5 mg Oral BID   atorvastatin  40 mg Oral QPM   carvedilol  12.5 mg Oral BID WC   fluticasone furoate-vilanterol  1 puff Inhalation Daily   And   umeclidinium bromide  1 puff Inhalation Daily   [START ON 02/25/2023] furosemide  40 mg Oral BID   losartan  25 mg Oral Daily   potassium chloride SA  20 mEq Oral Daily   sodium chloride flush  3 mL Intravenous Q12H   Continuous Infusions: PRN Meds:.acetaminophen **OR** acetaminophen, hydrALAZINE, nitroGLYCERIN, mouth rinse  Antimicrobials: Anti-infectives (From admission, onward)    None        I have personally reviewed the following labs and images: CBC: Recent Labs  Lab 02/21/23 1053 02/22/23 0427 02/23/23 0459 02/24/23 0444  WBC 11.4* 10.6* 10.1 11.2*  HGB 11.0* 10.9* 10.3* 11.3*  HCT 35.6* 36.2* 33.4* 36.0*  MCV 92.0 93.3 89.5 90.0  PLT  400 371 334 377   BMP &GFR Recent Labs  Lab 02/21/23 1053 02/22/23 0427 02/23/23 0459 02/24/23 0444  NA 140 142 138 136  K 4.1 4.1 3.0* 3.4*  CL 109 109 104 102  CO2 25 25 25 26   GLUCOSE 112* 103* 124* 102*  BUN 22 24* 23 28*  CREATININE 1.57* 1.74* 1.69* 1.85*  CALCIUM 8.7* 8.6* 8.3* 8.6*  MG  --   --  2.0 2.1  PHOS  --   --  3.2 3.5   Estimated Creatinine Clearance: 33.3 mL/min (A) (by C-G formula based on SCr of 1.85 mg/dL (H)). Liver & Pancreas: Recent Labs  Lab 02/21/23 1053 02/22/23 0427 02/23/23 0459 02/24/23 0444  AST 21 21  --   --   ALT 18 16  --   --   ALKPHOS 120 116  --   --   BILITOT 1.6* 1.3*  --   --   PROT 6.8 6.3*  --   --   ALBUMIN 3.1* 2.9* 2.8* 3.0*   No results for input(s): "LIPASE", "AMYLASE" in the last 168 hours. No results for input(s): "AMMONIA" in the last 168  hours. Diabetic: No results for input(s): "HGBA1C" in the last 72 hours. Recent Labs  Lab 02/22/23 2134  GLUCAP 117*   Cardiac Enzymes: No results for input(s): "CKTOTAL", "CKMB", "CKMBINDEX", "TROPONINI" in the last 168 hours. No results for input(s): "PROBNP" in the last 8760 hours. Coagulation Profile: No results for input(s): "INR", "PROTIME" in the last 168 hours. Thyroid Function Tests: No results for input(s): "TSH", "T4TOTAL", "FREET4", "T3FREE", "THYROIDAB" in the last 72 hours. Lipid Profile: No results for input(s): "CHOL", "HDL", "LDLCALC", "TRIG", "CHOLHDL", "LDLDIRECT" in the last 72 hours. Anemia Panel: No results for input(s): "VITAMINB12", "FOLATE", "FERRITIN", "TIBC", "IRON", "RETICCTPCT" in the last 72 hours. Urine analysis:    Component Value Date/Time   COLORURINE YELLOW 11/21/2022 2100   APPEARANCEUR CLEAR 11/21/2022 2100   APPEARANCEUR Cloudy (A) 06/16/2021 0936   LABSPEC 1.017 11/21/2022 2100   LABSPEC 1.018 04/23/2011 1044   PHURINE 5.0 11/21/2022 2100   GLUCOSEU NEGATIVE 11/21/2022 2100   GLUCOSEU Negative 04/23/2011 1044   HGBUR NEGATIVE  11/21/2022 2100   BILIRUBINUR NEGATIVE 11/21/2022 2100   BILIRUBINUR Negative 06/16/2021 0936   BILIRUBINUR Negative 04/23/2011 1044   KETONESUR NEGATIVE 11/21/2022 2100   PROTEINUR 100 (A) 11/21/2022 2100   NITRITE NEGATIVE 11/21/2022 2100   LEUKOCYTESUR NEGATIVE 11/21/2022 2100   LEUKOCYTESUR Trace 04/23/2011 1044   Sepsis Labs: Invalid input(s): "PROCALCITONIN", "LACTICIDVEN"  Microbiology: Recent Results (from the past 240 hours)  Resp panel by RT-PCR (RSV, Flu A&B, Covid) Anterior Nasal Swab     Status: None   Collection Time: 02/21/23 11:04 AM   Specimen: Anterior Nasal Swab  Result Value Ref Range Status   SARS Coronavirus 2 by RT PCR NEGATIVE NEGATIVE Final    Comment: (NOTE) SARS-CoV-2 target nucleic acids are NOT DETECTED.  The SARS-CoV-2 RNA is generally detectable in upper respiratory specimens during the acute phase of infection. The lowest concentration of SARS-CoV-2 viral copies this assay can detect is 138 copies/mL. A negative result does not preclude SARS-Cov-2 infection and should not be used as the sole basis for treatment or other patient management decisions. A negative result may occur with  improper specimen collection/handling, submission of specimen other than nasopharyngeal swab, presence of viral mutation(s) within the areas targeted by this assay, and inadequate number of viral copies(<138 copies/mL). A negative result must be combined with clinical observations, patient history, and epidemiological information. The expected result is Negative.  Fact Sheet for Patients:  BloggerCourse.com  Fact Sheet for Healthcare Providers:  SeriousBroker.it  This test is no t yet approved or cleared by the [place] FDA and  has been authorized for detection and/or diagnosis of SARS-CoV-2 by FDA under an Emergency Use Authorization (EUA). This EUA will remain  in effect (meaning this test can be used) for  the duration of the COVID-19 declaration under Section 564(b)(1) of the Act, 21 U.S.C.section 360bbb-3(b)(1), unless the authorization is terminated  or revoked sooner.       Influenza A by PCR NEGATIVE NEGATIVE Final   Influenza B by PCR NEGATIVE NEGATIVE Final    Comment: (NOTE) The Xpert Xpress SARS-CoV-2/FLU/RSV plus assay is intended as an aid in the diagnosis of influenza from Nasopharyngeal swab specimens and should not be used as a sole basis for treatment. Nasal washings and aspirates are unacceptable for Xpert Xpress SARS-CoV-2/FLU/RSV testing.  Fact Sheet for Patients: BloggerCourse.com  Fact Sheet for Healthcare Providers: SeriousBroker.it  This test is not yet approved or cleared by the [place] and has been authorized for  detection and/or diagnosis of SARS-CoV-2 by FDA under an Emergency Use Authorization (EUA). This EUA will remain in effect (meaning this test can be used) for the duration of the COVID-19 declaration under Section 564(b)(1) of the Act, 21 U.S.C. section 360bbb-3(b)(1), unless the authorization is terminated or revoked.     Resp Syncytial Virus by PCR NEGATIVE NEGATIVE Final    Comment: (NOTE) Fact Sheet for Patients: BloggerCourse.com  Fact Sheet for Healthcare Providers: SeriousBroker.it  This test is not yet approved or cleared by the [place] FDA and has been authorized for detection and/or diagnosis of SARS-CoV-2 by FDA under an Emergency Use Authorization (EUA). This EUA will remain in effect (meaning this test can be used) for the duration of the COVID-19 declaration under Section 564(b)(1) of the Act, 21 U.S.C. section 360bbb-3(b)(1), unless the authorization is terminated or revoked.  Performed at [facility], [address] [place], [place] [address]     Radiology Studies: No results found.    Feven Alderfer T.  Ikhlas Albo [facility]  If 7PM-7AM, please contact night-coverage [email or web address] 02/24/2023, 5:32 PM

## 2023-02-25 DIAGNOSIS — R55 Syncope and collapse: Secondary | ICD-10-CM | POA: Diagnosis not present

## 2023-02-25 DIAGNOSIS — I5023 Acute on chronic systolic (congestive) heart failure: Secondary | ICD-10-CM | POA: Diagnosis not present

## 2023-02-25 DIAGNOSIS — C44311 Basal cell carcinoma of skin of nose: Secondary | ICD-10-CM

## 2023-02-25 DIAGNOSIS — I2510 Atherosclerotic heart disease of native coronary artery without angina pectoris: Secondary | ICD-10-CM

## 2023-02-25 DIAGNOSIS — I10 Essential (primary) hypertension: Secondary | ICD-10-CM | POA: Diagnosis not present

## 2023-02-25 DIAGNOSIS — J449 Chronic obstructive pulmonary disease, unspecified: Secondary | ICD-10-CM

## 2023-02-25 LAB — CBC
HCT: 36.2 % — ABNORMAL LOW (ref 39.0–52.0)
Hemoglobin: 11.2 g/dL — ABNORMAL LOW (ref 13.0–17.0)
MCH: 28.1 pg (ref 26.0–34.0)
MCHC: 30.9 g/dL (ref 30.0–36.0)
MCV: 90.7 fL (ref 80.0–100.0)
Platelets: 353 10*3/uL (ref 150–400)
RBC: 3.99 MIL/uL — ABNORMAL LOW (ref 4.22–5.81)
RDW: 16.2 % — ABNORMAL HIGH (ref 11.5–15.5)
WBC: 10 10*3/uL (ref 4.0–10.5)
nRBC: 0 % (ref 0.0–0.2)

## 2023-02-25 LAB — RENAL FUNCTION PANEL
Albumin: 3 g/dL — ABNORMAL LOW (ref 3.5–5.0)
Anion gap: 7 (ref 5–15)
BUN: 27 mg/dL — ABNORMAL HIGH (ref 8–23)
CO2: 24 mmol/L (ref 22–32)
Calcium: 8.7 mg/dL — ABNORMAL LOW (ref 8.9–10.3)
Chloride: 106 mmol/L (ref 98–111)
Creatinine, Ser: 1.71 mg/dL — ABNORMAL HIGH (ref 0.61–1.24)
GFR, Estimated: 43 mL/min — ABNORMAL LOW (ref >60)
Glucose, Bld: 130 mg/dL — ABNORMAL HIGH (ref 70–99)
Phosphorus: 3.4 mg/dL (ref 2.5–4.6)
Potassium: 4 mmol/L (ref 3.5–5.1)
Sodium: 137 mmol/L (ref 135–145)

## 2023-02-25 LAB — BRAIN NATRIURETIC PEPTIDE: B Natriuretic Peptide: 1705 pg/mL — ABNORMAL HIGH (ref 0.0–100.0)

## 2023-02-25 LAB — MAGNESIUM: Magnesium: 2.2 mg/dL (ref 1.7–2.4)

## 2023-02-25 MED ORDER — FUROSEMIDE 40 MG PO TABS: 40.0000 mg | ORAL_TABLET | Freq: Two times a day (BID) | ORAL | 0 refills | Status: DC

## 2023-02-25 NOTE — Discharge Summary (Signed)
Physician Discharge Summary  Michael [name] ZOX:[id number] DOB: 04-Jan-1956 DOA: 02/21/2023  PCP: Benita Stabile, MD  Admit date: 02/21/2023 Discharge date: 02/25/2023 Admitted From: Home Disposition: Home Recommendations for Outpatient Follow-up:  Follow up with PCP in 1 to 2 weeks Outpatient follow-up with cardiology per cardiology recommendation Check fluid status, blood pressure, CMP and CBC at follow-up Please follow up on the following pending results: None   Home Health: Ambulatory referral to outpatient rehab as below Equipment/Devices: None  Discharge Condition: Stable CODE STATUS: Full code  Follow-up Information     [facility] Follow up.   Specialty: Rehabilitation Why: OP PT will give you a call to start PT services . If you do not hear from them within 24-48 hours after you DC. Please give them a call Contact information: [address] [place] [address] [phone]        Benita Stabile, MD. Schedule an appointment as soon as possible for a visit.   Specialty: Internal Medicine Contact information: [address] [place] [address] [phone]                 Hospital course 68 year old M with PMH of HFrEF, orthostatic hypotension, recurrent syncope, CKD-3, CAD, COPD, BCC s/p thyroidectomy and maxillectomy in 2019 at [facility], right carotid stenosis, HTN and tobacco use disorder presented to ED with problem as if shortness of breath, DOE, orthopnea and edema, and admitted with working diagnosis of acute on chronic HFrEF.  BNP > 4600.  Heart prominent JVD on presentation.  CT angio chest negative for PE but small pleural effusion.  Mild elevated troponin without significant delta.  Started on IV diuretics.  Cardiology consulted.   Patient was diuresed with IV Lasix under cardiology guidance with significant improvement in his symptoms.  I&O incomplete but net -4 L.  Weight is down  from 147 pounds to 135 pounds.  Creatinine improved to 1.71.  Eventually cleared for discharge on p.o. Lasix 40 mg twice daily by cardiology.  Cardiology to arrange outpatient follow-up.   See individual problem list below for more.   Problems addressed during this hospitalization Acute on chronic HFrEF: Presents with dyspnea, DOE, orthopnea and edema.  Likely due to medication none implants and fluid indiscretion.  Last echo 11/05/2022 w/LVEF 25%, mild decreased RV function, global HK, mild MR/TR. started on IV Lasix.  I&O was not well capture but net -4.1 L.  Weight is down from 147 to 135 pounds.  Creatinine improved as well. -Appreciate cardiology input-p.o. Lasix 40 mg twice daily, home Coreg and losartan. -Outpatient follow-up with PCP in 1 to 2 weeks -Outpatient follow-up with cardiology per cardiology recommendation     Syncope/history of orthostatic hypotension: Orthostatic vitals negative   Chronic COPD, emphysema: Stable. -Continue home Breztri and rescue inhalers.   CKD stage IIIb: At baseline.  Baseline Cr 1.8-2.0.  Recent Labs    11/21/22 1501 11/23/22 0049 11/24/22 0421 11/25/22 0420 02/05/23 1840 02/21/23 1053 02/22/23 0427 02/23/23 0459 02/24/23 0444 02/25/23 0603  BUN 28* 39* 47* 50* 28* 22 24* 23 28* 27*  CREATININE 1.87* 2.20* 2.06* 1.93* 1.74* 1.57* 1.74* 1.69* 1.85* 1.71*    Elevated troponin/type II MI/demand ischemia: Likely due to CHF exacerbation. -Manage CHF as above   History of AFib with RVR: Now rate controlled.  -Continue coreg and Eliquis.   Right carotid stenosis: He is now following with Dr. Olga Coaster was not felt to be  a candidate for carotid stenting and suboptimal candidate for endarterectomy. Plan is for medical therapy at this point -Continue eliquis   Basal cell carcinoma of nasolabial fold 06/06/2017--right partial rhinectomy, resection of left intranasal lesion, skin graft @[facility]. Margins were clear on pathology.   Iron deficiency  anemia/Advanced colonic adenoma: Colonoscopy 05/31/2017--Dr. Rourk. He was noted to have numerous polyps as well as a polypoid mass in the hepatic flexure to the cecum area, tubular adenoma pathology. -He was referred to general surgery, urged him to follow up.    Hyperbilirubinemia: Primarily suspect congestive hepatopathy with proven contrast reflux into hepatic veins on CTA.  Bilirubin improved after diuretics.   Cholelithiasis: Asymptomatic.    Leukocytosis: No nidus of infection currently noted.  Resolved.   Hypokalemia: Resolved. -Continue home potassium.            Time spent 35 minutes  Vital signs Vitals:   02/25/23 0500 02/25/23 0518 02/25/23 0752 02/25/23 0815  BP:  124/85  (!) 155/83  Pulse:  (!) 49  (!) 55  Temp:  98.2 F (36.8 C)  (!) 97.5 F (36.4 C)  Resp:  18  16  Height:      Weight: 61.3 kg     SpO2:  98% 99% 100%  TempSrc:  Oral  Oral  BMI (Calculated): 18.33        Discharge exam  GENERAL: No apparent distress.  Nontoxic. HEENT: MMM.  Vision and hearing grossly intact.  NECK: Supple.  No apparent JVD.  RESP:  No IWOB.  Fair aeration bilaterally. CVS:  RRR. Heart sounds normal.  ABD/GI/GU: BS+. Abd soft, NTND.  MSK/EXT:  Moves extremities. No apparent deformity. No edema.  SKIN: no apparent skin lesion or wound NEURO: Awake and alert. Oriented appropriately.  No apparent focal neuro deficit. PSYCH: Calm. Normal affect.   Discharge Instructions Discharge Instructions     Ambulatory referral to Physical Therapy   Complete by: As directed    Diet - low sodium heart healthy   Complete by: As directed    Discharge instructions   Complete by: As directed    It has been a pleasure taking care of you!  You were hospitalized due to heart failure exacerbation for which you have been treated.  We however stopped furosemide and started you on furosemide per recommendation by cardiology.  Please review your new medication list and the directions on  your medications before you take them.  Follow-up with your primary care doctor in 1 to 2 weeks or sooner if needed.  Follow-up with cardiology per their recommendation.   Take care,   Increase activity slowly   Complete by: As directed       Allergies as of 02/25/2023   No Known Allergies      Medication List     STOP taking these medications    diltiazem 30 MG tablet Commonly known as: Cardizem   torsemide 20 MG tablet Commonly known as: DEMADEX       TAKE these medications    albuterol 108 (90 Base) MCG/ACT inhaler Commonly known as: VENTOLIN HFA Inhale 2 puffs into the lungs every 6 (six) hours as needed for wheezing or shortness of breath.   apixaban 5 MG Tabs tablet Commonly known as: ELIQUIS Take 1 tablet (5 mg total) by mouth 2 (two) times daily.   aspirin EC 81 MG tablet Take 1 tablet (81 mg total) by mouth daily. Swallow whole.   atorvastatin 40 MG tablet Commonly known as: LIPITOR  Take 1 tablet (40 mg total) by mouth every evening. What changed: when to take this   Breztri Aerosphere 160-9-4.8 MCG/ACT Aero Generic drug: Budeson-Glycopyrrol-Formoterol Inhale 2 puffs into the lungs 2 (two) times daily.   carvedilol 12.5 MG tablet Commonly known as: COREG Take 1 tablet (12.5 mg total) by mouth 2 (two) times daily with a meal.   feeding supplement Liqd Take 237 mLs by mouth 3 (three) times daily between meals. What changed: when to take this   furosemide 40 MG tablet Commonly known as: LASIX Take 1 tablet (40 mg total) by mouth 2 (two) times daily.   Ilevro 0.3 % ophthalmic suspension Generic drug: nepafenac Place 1 drop into both eyes in the morning and at bedtime.   losartan 25 MG tablet Commonly known as: COZAAR Take 25 mg by mouth daily.   nitroGLYCERIN 0.4 MG SL tablet Commonly known as: NITROSTAT Place 1 tablet (0.4 mg total) under the tongue every 5 (five) minutes as needed for chest pain.   pantoprazole 40 MG tablet Commonly  known as: Protonix Take 1 tablet (40 mg total) by mouth daily.   potassium chloride SA 20 MEQ tablet Commonly known as: KLOR-CON M Take 1 tablet (20 mEq total) by mouth daily.   Repatha SureClick 140 MG/ML Soaj Generic drug: Evolocumab Inject 1 mL into the skin every 14 (fourteen) days.        Consultations: Cardiology  Procedures/Studies:   CT Angio Chest PE W/Cm &/Or Wo Cm Result Date: 02/21/2023 CLINICAL DATA:  Shortness of breath for 3 days.  History of COPD. EXAM: CT ANGIOGRAPHY CHEST WITH CONTRAST TECHNIQUE: Multidetector CT imaging of the chest was performed using the standard protocol during bolus administration of intravenous contrast. Multiplanar CT image reconstructions and MIPs were obtained to evaluate the vascular anatomy. RADIATION DOSE REDUCTION: This exam was performed according to the departmental dose-optimization program which includes automated exposure control, adjustment of the mA and/or kV according to patient size and/or use of iterative reconstruction technique. CONTRAST:  OMNIPAQUE IOHEXOL 350 MG/ML SOLN COMPARISON:  Chest radiograph of earlier today. Chest CT of 11/22/2022. FINDINGS: Cardiovascular: The quality of this exam for evaluation of pulmonary embolism is good. No evidence of pulmonary embolism. Aortic atherosclerosis. Moderate cardiomegaly, with trace pericardial fluid, likely physiologic. Left main and 3 vessel coronary artery calcification. Pulmonary artery enlargement, outflow tract 3.6 cm. Mediastinum/Nodes: No mediastinal or hilar adenopathy. Lungs/Pleura: Small right pleural effusion is similar. Trace left pleural fluid is new. Mild centrilobular emphysema.  Scattered calcified granulomas. Upper Abdomen: Reflux of contrast into the hepatic veins and IVC. Right hepatic lobe low-density lesion is likely a cyst at 8 mm. Stone in the gallbladder neck of 2.1 cm. Gallbladder incompletely imaged. Possible intrahepatic biliary duct dilatation,  incompletely imaged. Chronic calcific pancreatitis with chronic atrophy and duct dilatation as detailed on 10/20/2021 dedicated CT. Bilateral renal cortical thinning/atrophy. Musculoskeletal: No acute osseous abnormality. Review of the MIP images confirms the above findings. IMPRESSION: 1.  No evidence of pulmonary embolism. 2. Cardiomegaly with small bilateral pleural effusions and reflux of contrast into the hepatic veins and IVC, suggesting elevated right heart pressures and possible mild congestive failure. 3. No other explanation for shortness of breath. 4. Aortic atherosclerosis (ICD10-I70.0), coronary artery atherosclerosis and emphysema (ICD10-J43.9). 5. Cholelithiasis. Possible intrahepatic biliary duct dilatation, suboptimally evaluated. If bilirubin levels are elevated, consider dedicated nonemergent outpatient MRCP. 6. Pulmonary artery enlargement suggests pulmonary arterial hypertension. Electronically Signed   By: Jeronimo Greaves M.D.   On: 02/21/2023  13:44   DG Chest 2 View Result Date: 02/21/2023 CLINICAL DATA:  68 year old male with shortness of breath for 3 days. EXAM: CHEST - 2 VIEW COMPARISON:  Portable chest 02/05/2023 and earlier. FINDINGS: PA and lateral views 1125 hours. Chronic cardiomegaly and large lung volumes. Calcified aortic atherosclerosis. Stable cardiac size and mediastinal contours. Probable left lung nipple shadow, new since 02/05/2023 and and not correlated on CTA in October. Otherwise lung markings, pulmonary vascularity appears stable with no pneumothorax, pulmonary edema, pleural effusion or confluent lung opacity. Osteopenia. No acute osseous abnormality identified. Negative visible bowel gas. IMPRESSION: 1. Chronic cardiomegaly and pulmonary hyperinflation. Aortic Atherosclerosis (ICD10-I70.0). 2. Left nipple shadow suspected. No acute cardiopulmonary abnormality. Electronically Signed   By: Odessa Fleming M.D.   On: 02/21/2023 11:54   DG Chest Portable 1 View Result Date:  02/05/2023 CLINICAL DATA:  Chest pain EXAM: PORTABLE CHEST 1 VIEW COMPARISON:  11/21/2022 FINDINGS: Stable cardiomegaly. Pulmonary vascular congestion. No focal consolidation, pleural effusion, or pneumothorax. IMPRESSION: Cardiomegaly with pulmonary vascular congestion. Electronically Signed   By: Minerva Fester M.D.   On: 02/05/2023 19:17       The results of significant diagnostics from this hospitalization (including imaging, microbiology, ancillary and laboratory) are listed below for reference.     Microbiology: Recent Results (from the past 240 hours)  Resp panel by RT-PCR (RSV, Flu A&B, Covid) Anterior Nasal Swab     Status: None   Collection Time: 02/21/23 11:04 AM   Specimen: Anterior Nasal Swab  Result Value Ref Range Status   SARS Coronavirus 2 by RT PCR NEGATIVE NEGATIVE Final    Comment: (NOTE) SARS-CoV-2 target nucleic acids are NOT DETECTED.  The SARS-CoV-2 RNA is generally detectable in upper respiratory specimens during the acute phase of infection. The lowest concentration of SARS-CoV-2 viral copies this assay can detect is 138 copies/mL. A negative result does not preclude SARS-Cov-2 infection and should not be used as the sole basis for treatment or other patient management decisions. A negative result may occur with  improper specimen collection/handling, submission of specimen other than nasopharyngeal swab, presence of viral mutation(s) within the areas targeted by this assay, and inadequate number of viral copies(<138 copies/mL). A negative result must be combined with clinical observations, patient history, and epidemiological information. The expected result is Negative.  Fact Sheet for Patients:  BloggerCourse.com  Fact Sheet for Healthcare Providers:  SeriousBroker.it  This test is no t yet approved or cleared by the [place] FDA and  has been authorized for detection and/or diagnosis of  SARS-CoV-2 by FDA under an Emergency Use Authorization (EUA). This EUA will remain  in effect (meaning this test can be used) for the duration of the COVID-19 declaration under Section 564(b)(1) of the Act, 21 U.S.C.section 360bbb-3(b)(1), unless the authorization is terminated  or revoked sooner.       Influenza A by PCR NEGATIVE NEGATIVE Final   Influenza B by PCR NEGATIVE NEGATIVE Final    Comment: (NOTE) The Xpert Xpress SARS-CoV-2/FLU/RSV plus assay is intended as an aid in the diagnosis of influenza from Nasopharyngeal swab specimens and should not be used as a sole basis for treatment. Nasal washings and aspirates are unacceptable for Xpert Xpress SARS-CoV-2/FLU/RSV testing.  Fact Sheet for Patients: BloggerCourse.com  Fact Sheet for Healthcare Providers: SeriousBroker.it  This test is not yet approved or cleared by the [place] FDA and has been authorized for detection and/or diagnosis of SARS-CoV-2 by FDA under an Emergency Use Authorization (EUA). This EUA  will remain in effect (meaning this test can be used) for the duration of the COVID-19 declaration under Section 564(b)(1) of the Act, 21 U.S.C. section 360bbb-3(b)(1), unless the authorization is terminated or revoked.     Resp Syncytial Virus by PCR NEGATIVE NEGATIVE Final    Comment: (NOTE) Fact Sheet for Patients: BloggerCourse.com  Fact Sheet for Healthcare Providers: SeriousBroker.it  This test is not yet approved or cleared by the [place] FDA and has been authorized for detection and/or diagnosis of SARS-CoV-2 by FDA under an Emergency Use Authorization (EUA). This EUA will remain in effect (meaning this test can be used) for the duration of the COVID-19 declaration under Section 564(b)(1) of the Act, 21 U.S.C. section 360bbb-3(b)(1), unless the authorization is terminated  or revoked.  Performed at [facility], [address], [place], [place] [address]      Labs:  CBC: Recent Labs  Lab 02/21/23 1053 02/22/23 0427 02/23/23 0459 02/24/23 0444 02/25/23 0603  WBC 11.4* 10.6* 10.1 11.2* 10.0  HGB 11.0* 10.9* 10.3* 11.3* 11.2*  HCT 35.6* 36.2* 33.4* 36.0* 36.2*  MCV 92.0 93.3 89.5 90.0 90.7  PLT 400 371 334 377 353   BMP &GFR Recent Labs  Lab 02/21/23 1053 02/22/23 0427 02/23/23 0459 02/24/23 0444 02/25/23 0603  NA 140 142 138 136 137  K 4.1 4.1 3.0* 3.4* 4.0  CL 109 109 104 102 106  CO2 25 25 25 26 24   GLUCOSE 112* 103* 124* 102* 130*  BUN 22 24* 23 28* 27*  CREATININE 1.57* 1.74* 1.69* 1.85* 1.71*  CALCIUM 8.7* 8.6* 8.3* 8.6* 8.7*  MG  --   --  2.0 2.1 2.2  PHOS  --   --  3.2 3.5 3.4   Estimated Creatinine Clearance: 35.8 mL/min (A) (by C-G formula based on SCr of 1.71 mg/dL (H)). Liver & Pancreas: Recent Labs  Lab 02/21/23 1053 02/22/23 0427 02/23/23 0459 02/24/23 0444 02/25/23 0603  AST 21 21  --   --   --   ALT 18 16  --   --   --   ALKPHOS 120 116  --   --   --   BILITOT 1.6* 1.3*  --   --   --   PROT 6.8 6.3*  --   --   --   ALBUMIN 3.1* 2.9* 2.8* 3.0* 3.0*   No results for input(s): "LIPASE", "AMYLASE" in the last 168 hours. No results for input(s): "AMMONIA" in the last 168 hours. Diabetic: No results for input(s): "HGBA1C" in the last 72 hours. Recent Labs  Lab 02/22/23 2134  GLUCAP 117*   Cardiac Enzymes: No results for input(s): "CKTOTAL", "CKMB", "CKMBINDEX", "TROPONINI" in the last 168 hours. No results for input(s): "PROBNP" in the last 8760 hours. Coagulation Profile: No results for input(s): "INR", "PROTIME" in the last 168 hours. Thyroid Function Tests: No results for input(s): "TSH", "T4TOTAL", "FREET4", "T3FREE", "THYROIDAB" in the last 72 hours. Lipid Profile: No results for input(s): "CHOL", "HDL", "LDLCALC", "TRIG", "CHOLHDL", "LDLDIRECT" in the last 72 hours. Anemia Panel: No results for  input(s): "VITAMINB12", "FOLATE", "FERRITIN", "TIBC", "IRON", "RETICCTPCT" in the last 72 hours. Urine analysis:    Component Value Date/Time   COLORURINE YELLOW 11/21/2022 2100   APPEARANCEUR CLEAR 11/21/2022 2100   APPEARANCEUR Cloudy (A) 06/16/2021 0936   LABSPEC 1.017 11/21/2022 2100   LABSPEC 1.018 04/23/2011 1044   PHURINE 5.0 11/21/2022 2100   GLUCOSEU NEGATIVE 11/21/2022 2100   GLUCOSEU Negative 04/23/2011 1044   HGBUR NEGATIVE  11/21/2022 2100   BILIRUBINUR NEGATIVE 11/21/2022 2100   BILIRUBINUR Negative 06/16/2021 0936   BILIRUBINUR Negative 04/23/2011 1044   KETONESUR NEGATIVE 11/21/2022 2100   PROTEINUR 100 (A) 11/21/2022 2100   NITRITE NEGATIVE 11/21/2022 2100   LEUKOCYTESUR NEGATIVE 11/21/2022 2100   LEUKOCYTESUR Trace 04/23/2011 1044   Sepsis Labs: Invalid input(s): "PROCALCITONIN", "LACTICIDVEN"   SIGNED:  Almon Hercules, MD  [facility] 02/25/2023, 11:43 AM

## 2023-02-25 NOTE — Plan of Care (Signed)
Rested well during the night. No complaints.  Problem: Education: Goal: Knowledge of General Education information will improve Description: Including pain rating scale, medication(s)/side effects and non-pharmacologic comfort measures Outcome: Progressing   Problem: Health Behavior/Discharge Planning: Goal: Ability to manage health-related needs will improve Outcome: Progressing   Problem: Clinical Measurements: Goal: Ability to maintain clinical measurements within normal limits will improve Outcome: Progressing Goal: Will remain free from infection Outcome: Progressing Goal: Diagnostic test results will improve Outcome: Progressing Goal: Respiratory complications will improve Outcome: Progressing Goal: Cardiovascular complication will be avoided Outcome: Progressing   Problem: Activity: Goal: Risk for activity intolerance will decrease Outcome: Progressing   Problem: Nutrition: Goal: Adequate nutrition will be maintained Outcome: Progressing   Problem: Coping: Goal: Level of anxiety will decrease Outcome: Progressing   Problem: Elimination: Goal: Will not experience complications related to bowel motility Outcome: Progressing Goal: Will not experience complications related to urinary retention Outcome: Progressing   Problem: Pain Managment: Goal: General experience of comfort will improve and/or be controlled Outcome: Progressing   Problem: Safety: Goal: Ability to remain free from injury will improve Outcome: Progressing   Problem: Skin Integrity: Goal: Risk for impaired skin integrity will decrease Outcome: Progressing   Problem: Education: Goal: Ability to demonstrate management of disease process will improve Outcome: Progressing Goal: Ability to verbalize understanding of medication therapies will improve Outcome: Progressing Goal: Individualized Educational Video(s) Outcome: Progressing   Problem: Activity: Goal: Capacity to carry out activities  will improve Outcome: Progressing   Problem: Cardiac: Goal: Ability to achieve and maintain adequate cardiopulmonary perfusion will improve Outcome: Progressing

## 2023-02-27 ENCOUNTER — Telehealth: Payer: Self-pay

## 2023-02-27 NOTE — Transitions of Care (Post Inpatient/ED Visit) (Signed)
   02/27/2023  Name: Michael [name] MRN: [id number] DOB: July 19, 1955  Today's TOC FU Call Status: Today's TOC FU Call Status:: Unsuccessful Call (1st Attempt) Unsuccessful Call (1st Attempt) Date: 02/27/23  Attempted to reach the patient regarding the most recent Inpatient/ED visit. .Patient was called in an Outreach attempt to offer VBCI  30-day TOC program. Pt is eligible for program due to potential risk for readmission and/or high utilization. Unfortunately, I was not able to speak with the patient in regards to recent hospital discharge    Patient's voicemail has  not been set up. Unabe to leave message   Follow Up Plan: Additional outreach attempts will be made to reach the patient to complete the Transitions of Care (Post Inpatient/ED visit) call.   Susa Loffler , BSN, RN [place]   VBCI-Population Health RN Care Manager Direct Dial [phone]  Website: [email or web address]

## 2023-02-28 ENCOUNTER — Telehealth: Payer: Self-pay

## 2023-02-28 NOTE — Transitions of Care (Post Inpatient/ED Visit) (Signed)
02/28/2023  Name: Michael [name] MRN: [id number] DOB: 1955-10-28  Today's TOC FU Call Status: Today's TOC FU Call Status:: Successful TOC FU Call Completed Unsuccessful Call (1st Attempt) Date: 02/27/23 [facility] FU Call Complete Date: 02/28/23 Patient's Name and Date of Birth confirmed.  Transition Care Management Follow-up Telephone Call Date of Discharge: 02/24/23 [facility] Type of Discharge: Inpatient Admission Primary Inpatient Discharge Diagnosis:: Acute on chronic HFrEF (heart failure with reduced ejection fraction How have you been since you were released from the hospital?: Better Any questions or concerns?: No  Items Reviewed: Did you receive and understand the discharge instructions provided?: Yes Medications obtained,verified, and reconciled?: Yes (Medications Reviewed) Any new allergies since your discharge?: No Dietary orders reviewed?: Yes Type of Diet Ordered:: Reg Heart Healthy Do you have support at home?: Yes People in Home: friend(s) Name of Support/Comfort Primary Source: Michael [name] and her spouse ( patiet has a son who is uninvolved)  Medications Reviewed Today: Medications Reviewed Today     Reviewed by Johnnette Barrios, RN (Registered Nurse) on 02/28/23 at 1234  Med List Status: <None>   Medication Order Taking? Sig Documenting Provider Last Dose Status Informant  albuterol (VENTOLIN HFA) 108 (90 Base) MCG/ACT inhaler [id number] Yes Inhale 2 puffs into the lungs every 6 (six) hours as needed for wheezing or shortness of breath. Kendell Bane, MD Taking Active Self, Pharmacy Records  apixaban (ELIQUIS) 5 MG TABS tablet [id number] Yes Take 1 tablet (5 mg total) by mouth 2 (two) times daily. Michael Rough, MD Taking Active Self, Pharmacy Records  aspirin EC 81 MG tablet [id number] Yes Take 1 tablet (81 mg total) by mouth daily. Swallow whole. Vassie Loll, MD Taking Active Self, Pharmacy Records  atorvastatin (LIPITOR) 40 MG  tablet [id number] Yes Take 1 tablet (40 mg total) by mouth every evening.  Patient taking differently: Take 40 mg by mouth daily.   Kendell Bane, MD Taking Active Self, Pharmacy Records  Budeson-Glycopyrrol-Formoterol [facility] AEROSPHERE) 160-9-4.8 MCG/ACT Sandrea Matte [id number] Yes Inhale 2 puffs into the lungs 2 (two) times daily. [provider] Taking Active Self, Pharmacy Records  carvedilol (COREG) 12.5 MG tablet [id number] Yes Take 1 tablet (12.5 mg total) by mouth 2 (two) times daily with a meal. Vassie Loll, MD Taking Active Self, Pharmacy Records  feeding supplement (ENSURE ENLIVE / ENSURE PLUS) LIQD [id number] Yes Take 237 mLs by mouth 3 (three) times daily between meals.  Patient taking differently: Take 237 mLs by mouth daily.   Vassie Loll, MD Taking Active Self, Pharmacy Records  furosemide (LASIX) 40 MG tablet [id number] Yes Take 1 tablet (40 mg total) by mouth 2 (two) times daily. Almon Hercules, MD Taking Active   ILEVRO 0.3 % ophthalmic suspension [id number] Yes Place 1 drop into both eyes in the morning and at bedtime. [provider] Taking Active Self, Pharmacy Records  losartan (COZAAR) 25 MG tablet [id number] Yes Take 25 mg by mouth daily. [provider] Taking Active Self, Pharmacy Records  nitroGLYCERIN (NITROSTAT) 0.4 MG SL tablet [id number] Yes Place 1 tablet (0.4 mg total) under the tongue every 5 (five) minutes as needed for chest pain. Michael Rough, MD Taking Active Self, Pharmacy Records           Med Note [place], Devona Konig Feb 21, 2023  3:43 PM) Unknown last dose  pantoprazole (PROTONIX) 40 MG tablet [id number] Yes Take 1 tablet (40 mg total) by mouth daily. Vassie Loll, MD  Taking Active Self, Pharmacy Records  potassium chloride SA (KLOR-CON M) 20 MEQ tablet [id number] Yes Take 1 tablet (20 mEq total) by mouth daily. Kendell Bane, MD Taking Active Self, Pharmacy Records  REPATHA SURECLICK 140 MG/ML Ivory Broad [id number] Yes Inject 1  mL into the skin every 14 (fourteen) days. [provider] Taking Active Self, Pharmacy Records           Med Note (Raza Bayless L   Tue Feb 28, 2023 12:34 PM) Current orders but needs this refilled. Plans to follow-up with MD during visit           Medication reconciliation / review completed based on most recent discharge summary and EHR medication list. Unable ro fully Confirm patient is taking all newly prescribed medications as instructed Patients friend was nt familiar with all medications and reports new scripts form multiple providers . Enc her to have patient take all medications with him to PCP follow-up appointment , she reports PCP declined refills of several medications although she is unclear on which ones  Home Care and Equipment/Supplies: Were Home Health Services Ordered?: Yes Name of Home Health Agency:: [facility] [phone] Has Agency set up a time to come to your home?: No EMR reviewed for Home Health Orders: Orders present/patient has not received call (refer to CM for follow-up) (patient home < 24 hrs , has had HH prior) Any new equipment or medical supplies ordered?: No  Functional Questionnaire: Do you need assistance with bathing/showering or dressing?: No Do you need assistance with meal preparation?: No Do you need assistance with eating?: No Do you have difficulty maintaining continence: No Do you need assistance with getting out of bed/getting out of a chair/moving?: No Do you have difficulty managing or taking your medications?: No (uses pill packs has new perscritinsin room with packs)  Follow up appointments reviewed: PCP Follow-up appointment confirmed?: Yes Date of PCP follow-up appointment?: 03/08/23 Follow-up Provider: [facility] Follow-up appointment confirmed?: Yes Date of Specialist follow-up appointment?: 03/09/23 Follow-Up Specialty Provider:: Ellsworth Lennox, PA-C; Do you need  transportation to your follow-up appointment?: No (friend will transport)  SDOH Interventions Today    Flowsheet Row Most Recent Value  SDOH Interventions   Food Insecurity Interventions Intervention Not Indicated  Housing Interventions Intervention Not Indicated  Transportation Interventions Intervention Not Indicated, Patient Resources (Friends/Family)  [Friends transport]  Utilities Interventions Intervention Not Indicated  Social Connections Interventions Intervention Not Indicated      Benefits reviewed  Based on current information and Insurance plan -Reviewed benefits accessible to patient, including details about eligibility options for care and  available value based care options  if any areas of needs were identified.  Reviewed patient/  caregiver's ability to access and / or  ability with navigating the benefits system..Amb Referral made if indicted , refer to orders section of note for details   Reviewed goals for care Patient/ Caregiver  verbalizes understanding of instructions and care plan provided. Patient / Caregiver was encouraged to make informed decisions about their care, actively participate in managing their health condition, and implement lifestyle changes as needed to promote independence and self-management of health care. There were no reported  barriers to care. Although Michael [name] feels he lacks motivation and has poor endurance to be able to follow-up on upcoming therapy services   TOC program  Patient is at high risk for readmission and/or has history of  high utilization  Discussed VBCI  TOC program and weekly calls  to patient to assess condition/status, medication management  and provide support/education as indicated . Spoke with Michael [name] who is friend and caregiver Caregiver voiced understanding and declined enrollment in the 30-day TOC Program.   Michael [name] to accompany patient to follow - up appointments to review possible O2 therapy and discuss motivation  barriers she feels patient has . She also notes he has a son who is not involved and requested not to be contacted regarding patients care  Patient is currently followed by VBCI RN and SW will defer to previously established providers to avoid duplication in CM services    The patient has been provided with contact information for the care management team and has been advised to call with any health-related questions or concerns. Follow up as indicated with Care Team , or sooner should any new problems arise.    Susa Loffler , BSN, RN [facility]   VBCI-Population Health RN Care Manager Direct Dial [phone]  Fax: [phone] Website: [email or web address]

## 2023-03-01 ENCOUNTER — Inpatient Hospital Stay: Payer: 59 | Attending: Hematology

## 2023-03-01 DIAGNOSIS — N1832 Chronic kidney disease, stage 3b: Secondary | ICD-10-CM | POA: Insufficient documentation

## 2023-03-01 DIAGNOSIS — D509 Iron deficiency anemia, unspecified: Secondary | ICD-10-CM | POA: Diagnosis not present

## 2023-03-01 DIAGNOSIS — D631 Anemia in chronic kidney disease: Secondary | ICD-10-CM | POA: Insufficient documentation

## 2023-03-01 DIAGNOSIS — D500 Iron deficiency anemia secondary to blood loss (chronic): Secondary | ICD-10-CM

## 2023-03-01 DIAGNOSIS — E559 Vitamin D deficiency, unspecified: Secondary | ICD-10-CM

## 2023-03-01 LAB — CBC WITH DIFFERENTIAL/PLATELET
Abs Immature Granulocytes: 0.05 10*3/uL (ref 0.00–0.07)
Basophils Absolute: 0.1 10*3/uL (ref 0.0–0.1)
Basophils Relative: 1 %
Eosinophils Absolute: 0.3 10*3/uL (ref 0.0–0.5)
Eosinophils Relative: 3 %
HCT: 41.2 % (ref 39.0–52.0)
Hemoglobin: 12.8 g/dL — ABNORMAL LOW (ref 13.0–17.0)
Immature Granulocytes: 1 %
Lymphocytes Relative: 13 %
Lymphs Abs: 1.3 10*3/uL (ref 0.7–4.0)
MCH: 28.2 pg (ref 26.0–34.0)
MCHC: 31.1 g/dL (ref 30.0–36.0)
MCV: 90.7 fL (ref 80.0–100.0)
Monocytes Absolute: 0.9 10*3/uL (ref 0.1–1.0)
Monocytes Relative: 9 %
Neutro Abs: 7.2 10*3/uL (ref 1.7–7.7)
Neutrophils Relative %: 73 %
Platelets: 371 10*3/uL (ref 150–400)
RBC: 4.54 MIL/uL (ref 4.22–5.81)
RDW: 15.7 % — ABNORMAL HIGH (ref 11.5–15.5)
WBC: 9.8 10*3/uL (ref 4.0–10.5)
nRBC: 0 % (ref 0.0–0.2)

## 2023-03-01 LAB — COMPREHENSIVE METABOLIC PANEL
ALT: 17 U/L (ref 0–44)
AST: 20 U/L (ref 15–41)
Albumin: 3.4 g/dL — ABNORMAL LOW (ref 3.5–5.0)
Alkaline Phosphatase: 121 U/L (ref 38–126)
Anion gap: 9 (ref 5–15)
BUN: 24 mg/dL — ABNORMAL HIGH (ref 8–23)
CO2: 26 mmol/L (ref 22–32)
Calcium: 9.4 mg/dL (ref 8.9–10.3)
Chloride: 105 mmol/L (ref 98–111)
Creatinine, Ser: 1.66 mg/dL — ABNORMAL HIGH (ref 0.61–1.24)
GFR, Estimated: 45 mL/min — ABNORMAL LOW (ref >60)
Glucose, Bld: 159 mg/dL — ABNORMAL HIGH (ref 70–99)
Potassium: 3.8 mmol/L (ref 3.5–5.1)
Sodium: 140 mmol/L (ref 135–145)
Total Bilirubin: 0.9 mg/dL (ref 0.0–1.2)
Total Protein: 7.4 g/dL (ref 6.5–8.1)

## 2023-03-01 LAB — IRON AND TIBC
Iron: 47 ug/dL (ref 45–182)
Saturation Ratios: 13 % — ABNORMAL LOW (ref 17.9–39.5)
TIBC: 352 ug/dL (ref 250–450)
UIBC: 305 ug/dL

## 2023-03-01 LAB — FERRITIN: Ferritin: 123 ng/mL (ref 24–336)

## 2023-03-02 ENCOUNTER — Ambulatory Visit: Payer: Self-pay | Admitting: *Deleted

## 2023-03-02 NOTE — Patient Instructions (Signed)
Visit Information  Thank you for taking time to visit with me today. Please don't hesitate to contact me if I can be of assistance to you.   Following are the goals we discussed today:   Goals Addressed               This Visit's Progress     COMPLETED: Receive Assistance Pursuing Higher Level of Care Placement Options. (pt-stated)   On track     Care Coordination Interventions:  Interventions Today    Flowsheet Row Most Recent Value  Chronic Disease   Chronic disease during today's visit Diabetes, Congestive Heart Failure (CHF), Chronic Kidney Disease/End Stage Renal Disease (ESRD), Chronic Obstructive Pulmonary Disease (COPD), Hypertension (HTN), Atrial Fibrillation (AFib), Other  [Fall Risk, Requires Assistance with Activities of Daily Living, Bilateral Cataract Removal, Unsteady Balance/Gait, History of Falls.]  General Interventions   General Interventions Discussed/Reviewed General Interventions Discussed, Labs, Vaccines, Doctor Visits, Health Screening, Annual Foot Exam, Lipid Profile, General Interventions Reviewed, Annual Eye Exam, Durable Medical Equipment (DME), Community Resources, Level of Care, Communication with  [Encouraged Routine Engagement with Care Team Members & Providers.]  Labs Hgb A1c every 3 months, Kidney Function, Hgb A1c annually  [Encouraged Routine Labwork.]  Vaccines COVID-19, Tetanus/Pertussis/Diphtheria, Flu, Pneumonia, RSV, Shingles  [Encouraged Routine Vaccinations.]  Doctor Visits Discussed/Reviewed Doctor Visits Discussed, Specialist, Doctor Visits Reviewed, Annual Wellness Visits, PCP  [Encouraged Routine Engagement with Care Team Members & Providers.]  Health Screening Bone Density, Colonoscopy, Prostate  [Encouraged Routine Health Screenings.]  Durable Medical Equipment (DME) Bed side commode, BP Cuff, Glucomoter, Environmental consultant, Biomedical scientist, Other  [Prescription Eyeglasses, Hand-Held [employer], Engineer, materials in [place], Medical laboratory scientific officer & Scales.]  Wheelchair Standard   PCP/Specialist Visits Compliance with follow-up visit  [Encouraged Routine Engagement with Care Team Members & Providers.]  Communication with PCP/Specialists, Charity fundraiser, Pharmacists, Social Work  [employer] Routine Engagement with Care Team Members & Providers.]  Level of Care Adult Daycare, Air traffic controller, Assisted Living, Skilled Nursing Facility  [Confirmed Disinterest in Enrollment in Adult Day Care Program or Receiving Assistance Pursuing Higher Level of Care Placement Options (I.e Assisted Living Versus Skilled Nursing Facility).]  Applications Medicaid, Personal Care Services  [facility] Active [place] Access Medicaid Coverage. Confirmed Disinterest in Applying for Personal Care Services.]  Exercise Interventions   Exercise Discussed/Reviewed Exercise Discussed, Assistive device use and maintanence, Exercise Reviewed, Physical Activity, Weight Managment  [Encouraged Daily Exercise Regimen, as Tolerated.]  Physical Activity Discussed/Reviewed Physical Activity Discussed, Home Exercise Program (HEP), Physical Activity Reviewed, PREP, Gym, Types of exercise  [Encouraged Increased Level of Activity, Inside & Outside the Home.]  Weight Management Weight maintenance  [Encouraged Heart-Healthy, Diabetic-Friendly, Low Sodium, Reduced Fat, Renal-Friendly, Diet.]  Education Interventions   Education Provided Provided Education  [Thoroughly Reviewed Educational Material & Encouraged Continued Independent Review.]  Provided Verbal Education On Nutrition, Mental Health/Coping with Illness, When to see the doctor, Foot Care, Eye Care, Labs, Blood Sugar Monitoring, Applications, Exercise, Medication, Insurance Plans, [employer]  [employer] Implementation of Educational Material Reviewed.]  Labs Reviewed Hgb A1c  [employer] & Encouraged Daily Logging of Results to Share with Providers.]  Ship broker, Personal Care Services  [employer] Active [employer] Coverage. Confirmed Disinterest  in Applying for Personal Care Services.]  Mental Health Interventions   Mental Health Discussed/Reviewed Mental Health Discussed, Anxiety, Depression, Mental Health Reviewed, Grief and Loss, Substance Abuse, Coping Strategies, Suicide, Crisis, Other  [Assessed Mental Health & Cognitive Status.]  Nutrition Interventions   Nutrition Discussed/Reviewed Nutrition Discussed, Adding fruits and vegetables, Increasing  proteins, Decreasing fats, Nutrition Reviewed, Fluid intake, Carbohydrate meal planning, Portion sizes, Decreasing salt, Supplemental nutrition, Decreasing sugar intake  [Encouraged Healthy Diet & Supplemental Nutrition.]  Pharmacy Interventions   Pharmacy Dicussed/Reviewed Pharmacy Topics Discussed, Medications and their functions, Medication Adherence, Pharmacy Topics Reviewed, Affording Medications  [Confirmed Pill Packaging through [employer]. Confirmed Ability to Afford Prescription Medications.]  Medication Adherence --  [Confirmed Compliance with Prescription Medications.]  Safety Interventions   Safety Discussed/Reviewed Safety Discussed, Safety Reviewed, Fall Risk, Home Safety  [Encouraged Consideration of Home Safety Evaluation.]  Journalist, newspaper, Refer for community resources  [Encouraged Routine Use of Assistive Devices & Durable Medical Equipment.]  Advanced Directive Interventions   Advanced Directives Discussed/Reviewed Advanced Directives Discussed, Advanced Directives Reviewed  [Confirmed Initiation of Advanced Directives (Living Will & Healthcare Power of Corporate treasurer), Requesting Copies to Scan into Electronic Medical Record in Epic.]  End of Life Palliative  [facility] Care Services Provided through [place] [facility].]       Active Listening & Reflection Utilized.  Verbalization of Feelings Encouraged.  Emotional Support Provided. Acceptance & Commitment Therapy Implemented. Cognitive Behavioral Therapy Initiated. Encouraged  Engagement with Danford Bad, Licensed Clinical Social Worker with [facility], [facility] [phone]), if You Have Questions, Need Assistance, Additional Social Work Needs Are Identified in The Near Future, or If You Change Your Mind About Wanting to Receive Social Work Services.       Please call the care guide team at [phone] if you need to cancel or reschedule your appointment.   If you are experiencing a Mental Health or Behavioral Health Crisis or need someone to talk to, please call the Suicide and Crisis Lifeline: 988 call the [place] National Suicide Prevention Lifeline: (715) 789-4247 or TTY: 254-364-5975 TTY (364) 801-3622) to talk to a trained counselor call 1-800-273-TALK (toll free, 24 hour hotline) go to [facility] [address] [place] [id number]) call the [facility] Crisis Line: 440-632-3737 call 911  Patient verbalizes understanding of instructions and care plan provided today and agrees to view in MyChart. Active MyChart status and patient understanding of how to access instructions and care plan via MyChart confirmed with patient.     No further follow up required.  Danford Bad, BSW, MSW, LCSW [facility], [facility] Clinical Social Worker II Direct Dial: [phone]  Fax: [phone] Website: [email or web address]

## 2023-03-02 NOTE — Patient Outreach (Signed)
Care Coordination   Follow Up Visit Note   03/02/2023  Name: Michael [name] MRN: [id number] DOB: September 21, 1955  Michael [name] is a 68 y.o. year old male who sees Michael [name], Michael Hazel, MD for primary care. I spoke with Michael [name] by phone today.  What matters to the patients health and wellness today?  Receive Assistance Pursuing Higher Level of Care Placement Options.     Goals Addressed               This Visit's Progress     COMPLETED: Receive Assistance Pursuing Higher Level of Care Placement Options. (pt-stated)   On track     Care Coordination Interventions:  Interventions Today    Flowsheet Row Most Recent Value  Chronic Disease   Chronic disease during today's visit Diabetes, Congestive Heart Failure (CHF), Chronic Kidney Disease/End Stage Renal Disease (ESRD), Chronic Obstructive Pulmonary Disease (COPD), Hypertension (HTN), Atrial Fibrillation (AFib), Other  [Fall Risk, Requires Assistance with Activities of Daily Living, Bilateral Cataract Removal, Unsteady Balance/Gait, History of Falls.]  General Interventions   General Interventions Discussed/Reviewed General Interventions Discussed, Labs, Vaccines, Doctor Visits, Health Screening, Annual Foot Exam, Lipid Profile, General Interventions Reviewed, Annual Eye Exam, Durable Medical Equipment (DME), Community Resources, Level of Care, Communication with  [Encouraged Routine Engagement with Care Team Members & Providers.]  Labs Hgb A1c every 3 months, Kidney Function, Hgb A1c annually  [Encouraged Routine Labwork.]  Vaccines COVID-19, Tetanus/Pertussis/Diphtheria, Flu, Pneumonia, RSV, Shingles  [Encouraged Routine Vaccinations.]  Doctor Visits Discussed/Reviewed Doctor Visits Discussed, Specialist, Doctor Visits Reviewed, Annual Wellness Visits, PCP  [Encouraged Routine Engagement with Care Team Members & Providers.]  Health Screening Bone Density, Colonoscopy, Prostate  [Encouraged Routine Health Screenings.]   Durable Medical Equipment (DME) Bed side commode, BP Cuff, Glucomoter, Environmental consultant, Biomedical scientist, Other  [Prescription Eyeglasses, Hand-Held [employer], Engineer, materials in [place], Medical laboratory scientific officer & Scales.]  Wheelchair Standard  PCP/Specialist Visits Compliance with follow-up visit  [Encouraged Routine Engagement with Care Team Members & Providers.]  Communication with PCP/Specialists, Charity fundraiser, Pharmacists, Social Work  [employer] Routine Engagement with Care Team Members & Providers.]  Level of Care Adult Daycare, Air traffic controller, Assisted Living, Skilled Nursing Facility  [Confirmed Disinterest in Enrollment in Adult Day Care Program or Receiving Assistance Pursuing Higher Level of Care Placement Options (I.e Assisted Living Versus Skilled Nursing Facility).]  Applications Medicaid, Personal Care Services  [facility] Active [place] Access Medicaid Coverage. Confirmed Disinterest in Applying for Personal Care Services.]  Exercise Interventions   Exercise Discussed/Reviewed Exercise Discussed, Assistive device use and maintanence, Exercise Reviewed, Physical Activity, Weight Managment  [Encouraged Daily Exercise Regimen, as Tolerated.]  Physical Activity Discussed/Reviewed Physical Activity Discussed, Home Exercise Program (HEP), Physical Activity Reviewed, PREP, Gym, Types of exercise  [Encouraged Increased Level of Activity, Inside & Outside the Home.]  Weight Management Weight maintenance  [Encouraged Heart-Healthy, Diabetic-Friendly, Low Sodium, Reduced Fat, Renal-Friendly, Diet.]  Education Interventions   Education Provided Provided Education  [Thoroughly Reviewed Educational Material & Encouraged Continued Independent Review.]  Provided Verbal Education On Nutrition, Mental Health/Coping with Illness, When to see the doctor, Foot Care, Eye Care, Labs, Blood Sugar Monitoring, Applications, Exercise, Medication, Insurance Plans, [employer]  [employer] Implementation of Educational Material Reviewed.]  Labs Reviewed  Hgb A1c  [employer] & Encouraged Daily Logging of Results to Share with Providers.]  Ship broker, Personal Care Services  [employer] Active [employer] Coverage. Confirmed Disinterest in Applying for Personal Care Services.]  Mental Health Interventions   Mental Health Discussed/Reviewed Mental Health Discussed, Anxiety,  Depression, Mental Health Reviewed, Grief and Loss, Substance Abuse, Coping Strategies, Suicide, Crisis, Other  [Assessed Mental Health & Cognitive Status.]  Nutrition Interventions   Nutrition Discussed/Reviewed Nutrition Discussed, Adding fruits and vegetables, Increasing proteins, Decreasing fats, Nutrition Reviewed, Fluid intake, Carbohydrate meal planning, Portion sizes, Decreasing salt, Supplemental nutrition, Decreasing sugar intake  [Encouraged Healthy Diet & Supplemental Nutrition.]  Pharmacy Interventions   Pharmacy Dicussed/Reviewed Pharmacy Topics Discussed, Medications and their functions, Medication Adherence, Pharmacy Topics Reviewed, Affording Medications  [Confirmed Pill Packaging through [employer]. Confirmed Ability to Afford Prescription Medications.]  Medication Adherence --  [Confirmed Compliance with Prescription Medications.]  Safety Interventions   Safety Discussed/Reviewed Safety Discussed, Safety Reviewed, Fall Risk, Home Safety  [Encouraged Consideration of Home Safety Evaluation.]  Journalist, newspaper, Refer for community resources  [Encouraged Routine Use of Assistive Devices & Durable Medical Equipment.]  Advanced Directive Interventions   Advanced Directives Discussed/Reviewed Advanced Directives Discussed, Advanced Directives Reviewed  [Confirmed Initiation of Advanced Directives (Living Will & Healthcare Power of Corporate treasurer), Requesting Copies to Scan into Electronic Medical Record in Epic.]  End of Life Palliative  [facility] Care Services Provided through [place] [facility].]       Active  Listening & Reflection Utilized.  Verbalization of Feelings Encouraged.  Emotional Support Provided. Acceptance & Commitment Therapy Implemented. Cognitive Behavioral Therapy Initiated. Encouraged Engagement with Danford Bad, Licensed Clinical Social Worker with [facility], [facility] [phone]), if You Have Questions, Need Assistance, Additional Social Work Needs Are Identified in The Near Future, or If You Change Your Mind About Wanting to Receive Social Work Services.       SDOH assessments and interventions completed:  Yes.  Care Coordination Interventions:  Yes, provided.   Follow up plan: No further intervention required.   Encounter Outcome:  Patient Visit Completed.   Danford Bad, BSW, MSW, LCSW [facility], [facility] Clinical Social Worker II Direct Dial: [phone]  Fax: [phone] Website: [email or web address]

## 2023-03-07 NOTE — Progress Notes (Addendum)
 [facility] [address], [place] [address]   CLINIC:  Medical Oncology/Hematology  PCP:  Shona Norleen PEDLAR, MD [address] [place] [address] [phone]   REASON FOR VISIT:  Follow-up for iron  deficiency anemia  CURRENT THERAPY: Intermittent IV iron   INTERVAL HISTORY:   Michael [name] 68 y.o. male returns for routine follow-up of iron  deficiency anemia.  He was last seen by Pleasant Barefoot PA-C on 11/29/2022.  He received Venofer  300 mg x 3 from 12/07/2022 through 12/23/2022.  Since his last visit, he has been hospitalized once (02/21/2023 through 02/25/2023) for acute on chronic HFrEF.  At today's visit, he reports feeling poorly.  He has significant fatigue. He reports that his bowel movements are generally dark, and he denies any obvious bleeding such as bright blood per rectum or gross melena.  He has positional vertigo as well as lightheadedness and intermittent syncopal episodes (following closely with cardiology).  No pica, restless legs, headaches, chest pain.  He has chronic dyspnea on exertion.    He has 25% energy and 50% appetite. He endorses that he is maintaining a stable weight.  ASSESSMENT & PLAN:  1.    Iron  deficiency anemia + anemia of CKD stage IIIb - Combination anemia from CKD, malabsorption and blood loss. - Hemoccult stool was POSITIVE x3 (June 2022) - Most recent colonoscopy (07/19/2021) with villous, fungating, infiltrative and polypoid nonobstructing mass in ascending colon (addressed below); no bleeding was present - Right hemicolectomy on 08/11/2021, pathology showed high-grade dysplasia negative for invasive carcinoma -- He has never had an EGD - Patient is hesitant to try oral iron  due to abdominal side effects - He has been on intermittent IV iron  infusions.  (Most recent IV Venofer  300 mg x 3 in November 2024) - Most recent labs (03/01/2023): Hgb 12.8/MCV 90.7.  Ferritin 123, iron  saturation 13%. Patient also has CKD stage  IIIb with creatinine more or less at baseline (1.66/GFR 45). - Intermittent dark stool and occasional red streaks (stool cards sent home, not completed) - Ongoing iron  deficiency despite IV repletion has been suspicious for ongoing occult blood loss, despite colon mass having been removed (right hemicolectomy on 08/11/2021) - PLAN:Recommend additional IV Venofer  400 mg x 1.   - Repeat labs and RTC in 4 months. - Continue GI follow-up and consider EGD  - Patient is aware of alarm signs/symptoms that would prompt immediate medical attention   2.  Locally advanced basal cell carcinoma right nasolabial fold: - Biopsy of the right nasal mucosa on 04/05/2017 with basal cell carcinoma. - Biopsy of the left cheek lesion on 04/02/2017 consistent with basal cell carcinoma. - Resection with skin graft on 06/06/2017.  Margin was close.  Rest of the margins were negative. - 10 rounds of radiation by Dr. Izell for recurrent basal cell carcinoma to the right medial canthus completed on 03/20/2020.  - PLAN: Continue dermatology follow-up   3.  Right colon mass (tubular adenomas with high-grade dysplasia) - PET scan in 2019 (obtained due to basal cell skin cancer) demonstrated area of concern with mass in ascending colon - Colonoscopy in 2019 by Dr. Shaaron demonstrated multiple polyps with tubular adenoma, and patient was referred for surgical excision of colon due to volume of polyps and concern for more extensive disease.  Patient was lost to follow-up with GI and did not follow-up with surgeon at this time. - Reestablished care with GI in May 2023, with colonoscopy he underwent colonoscopy  (07/19/2021)  showing villous, fungating, infiltrative and polypoid nonobstructing mass in ascending colon - CT abdomen/pelvis (07/05/2021): Lobular intraluminal mass at: Of hepatic flexure measuring up to 8 cm in length; new pancreatic ductal dilation and atrophy favored to be sequela of chronic pancreatitis but MRI recommended - MRI  abdomen (08/06/2021) showed upstream pancreatic atrophy and duct dilation secondary to dominant ductal calculi; no convincing evidence of underlying neoplasm - Due to large size of residual tubular adenoma/right colon mass, patient underwent right hemicolectomy on 08/11/2021.  Surgical pathology showed multiple tubular adenomas, several of which showed focal high-grade dysplasia, but were negative for invasive carcinoma.   - PLAN: Continue follow-up with GI.  (Patient was no-show and lost to follow-up with GI in May 2024.  Patient has been provided information to contact [facility], and message has been sent to GI APP)   4.  Vitamin D  deficiency - Prior vitamin D  (07/31/2021) low at 18.95 - Most recent vitamin D  (01/05/2022): Improved and normal at 35.06 - Patient does not think he is taking his vitamin D  50,000 units weekly anymore - Vitamin D  today (11/29/2022) normal at 54.35 - PLAN: Recheck vitamin D  at follow-up  PLAN SUMMARY:  >> Venofer  400 mg x 1 >> Labs in 4 months = CBC/D, CMP, ferritin, iron /TIBC, vitamin D  >> OFFICE visit in 4 months (1 week after labs)  >> Chart Cc'd to GI provider to re-establish care     REVIEW OF SYSTEMS:  Review of Systems  Constitutional:  Positive for fatigue. Negative for appetite change, chills, diaphoresis, fever and unexpected weight change.  HENT:   Negative for lump/mass and nosebleeds.   Eyes:  Negative for eye problems.  Respiratory:  Positive for cough and shortness of breath (with exertion). Negative for hemoptysis.   Cardiovascular:  Positive for palpitations. Negative for chest pain and leg swelling.  Gastrointestinal:  Negative for abdominal pain, blood in stool, constipation, diarrhea, nausea and vomiting.  Genitourinary:  Negative for hematuria.   Skin: Negative.   Neurological:  Positive for dizziness. Negative for headaches and light-headedness.  Hematological:  Does not bruise/bleed easily.  Psychiatric/Behavioral:  Positive for sleep  disturbance.      PHYSICAL EXAM:  ECOG PERFORMANCE STATUS: 1 - Symptomatic but completely ambulatory  Vitals:   03/08/23 0939 03/08/23 0944  BP: (!) 177/109 (!) 169/97  Pulse: 79   Resp: 20   Temp: (!) 96.5 F (35.8 C)   SpO2: 94%     Filed Weights   03/08/23 0939  Weight: 140 lb 14 oz (63.9 kg)    Physical Exam Constitutional:      Appearance: Normal appearance. He is normal weight.  Cardiovascular:     Heart sounds: Normal heart sounds.  Pulmonary:     Breath sounds: Normal breath sounds.  Neurological:     General: No focal deficit present.     Mental Status: Mental status is at baseline.  Psychiatric:        Behavior: Behavior normal. Behavior is cooperative.     PAST MEDICAL/SURGICAL HISTORY:  Past Medical History:  Diagnosis Date   Aortic atherosclerosis (HCC) 08/03/2021   CAD (coronary artery disease) 08/03/2021   Cardiac catheterization September 2023 with RCA/RV marginal stenosis managed medically   Cardiomyopathy (HCC)    Carotid artery disease (HCC)    CKD (chronic kidney disease) stage 3, GFR 30-59 ml/min (HCC)    COPD (chronic obstructive pulmonary disease) (HCC)    Essential hypertension    Head and neck cancer (HCC) 2019  Right facial basal cell carcinoma s/p resection with right partial mastectomy and partal rhinectomy with skin graft (06/13/17)   Iron  deficiency anemia    Urinary retention    Past Surgical History:  Procedure Laterality Date   ANKLE CLOSED REDUCTION Right    open reduction   BASAL CELL CARCINOMA EXCISION  2019   at [facility]   BIOPSY  07/19/2021   Procedure: BIOPSY;  Surgeon: Cindie Carlin POUR, DO;  [facility]: [facility];  Service: Endoscopy;;   CATARACT EXTRACTION W/PHACO Left 01/23/2023   Procedure: CATARACT EXTRACTION PHACO AND INTRAOCULAR LENS PLACEMENT (IOC);  Surgeon: Harrie Agent, MD;  Location: [facility];  Service: Ophthalmology;  Laterality: Left;  CDE: 23.26   COLONOSCOPY N/A 05/31/2017   Procedure: COLONOSCOPY;   Surgeon: Shaaron Lamar HERO, MD;  Location: [facility];  Service: Endoscopy;  Laterality: N/A;  2:45pm   COLONOSCOPY WITH PROPOFOL  N/A 07/19/2021   Procedure: COLONOSCOPY WITH PROPOFOL ;  Surgeon: Cindie Carlin POUR, DO;  Location: [facility];  Service: Endoscopy;  Laterality: N/A;  3:00pm, moved up to 9:00   CYSTOSCOPY N/A 10/29/2018   Procedure: CYSTOSCOPY, CLOT EVACUATION;  Surgeon: Devere Lonni Righter, MD;  Location: [facility];  Service: Urology;  Laterality: N/A;   PARTIAL COLECTOMY Right 08/11/2021   Procedure: PARTIAL COLECTOMY, OPEN RIGHT HEMICOLECTOMY;  Surgeon: Kallie Manuelita BROCKS, MD;  Location: [facility];  Service: General;  Laterality: Right;   POLYPECTOMY  05/31/2017   Procedure: POLYPECTOMY;  Surgeon: Shaaron Lamar HERO, MD;  [facility]: [facility];  Service: Endoscopy;;   RIGHT/LEFT HEART CATH AND CORONARY ANGIOGRAPHY N/A 10/22/2021   Procedure: RIGHT/LEFT HEART CATH AND CORONARY ANGIOGRAPHY;  Surgeon: Wendel Lurena POUR, MD;  Location: [facility];  Service: Cardiovascular;  Laterality: N/A;   TONSILLECTOMY     TRANSCAROTID ARTERY REVASCULARIZATION  Right 02/15/2021   Procedure: RIGHT TRANSCAROTID ARTERY REVASCULARIZATION;  Surgeon: Gretta Lonni PARAS, MD;  Location: [facility];  Service: Vascular;  Laterality: Right;   ULTRASOUND GUIDANCE FOR VASCULAR ACCESS Left 02/15/2021   Procedure: ULTRASOUND GUIDANCE FOR VASCULAR ACCESS;  Surgeon: Gretta Lonni PARAS, MD;  Location: [facility] OR;  Service: Vascular;  Laterality: Left;   XI ROBOTIC ASSISTED SIMPLE PROSTATECTOMY N/A 10/29/2018   Procedure: XI ROBOTIC ASSISTED SIMPLE PROSTATECTOMY;  Surgeon: Sherrilee Belvie CROME, MD;  Location: [facility];  Service: Urology;  Laterality: N/A;    SOCIAL HISTORY:  Social History   Socioeconomic History   Marital status: Single    Spouse name: Not on file   Number of children: Not on file   Years of education: 12   Highest education level: 12th grade  Occupational History   Not on file  Tobacco Use    Smoking status: Former    Current packs/day: 0.00    Average packs/day: 0.5 packs/day for 40.0 years (20.0 ttl pk-yrs)    Types: Cigarettes    Start date: 07/02/1981    Quit date: 07/02/2021    Years since quitting: 1.6    Passive exposure: Past   Smokeless tobacco: Never   Tobacco comments:    Quit on 06/09/21  Vaping Use   Vaping status: Never Used  Substance and Sexual Activity   Alcohol use: Not Currently   Drug use: Never   Sexual activity: Not Currently  Other Topics Concern   Not on file  Social History Narrative   Not on file   Social Drivers of Health   Financial Resource Strain: Low Risk  (12/26/2022)   Overall Financial Resource  Strain (CARDIA)    Difficulty of Paying Living Expenses: Not hard at all  Food Insecurity: No Food Insecurity (02/28/2023)   Hunger Vital Sign    Worried About Running Out of Food in the Last Year: Never true    Ran Out of Food in the Last Year: Never true  Transportation Needs: No Transportation Needs (02/28/2023)   PRAPARE - Administrator, Civil Service (Medical): No    Lack of Transportation (Non-Medical): No  Physical Activity: Sufficiently Active (12/26/2022)   Exercise Vital Sign    Days of Exercise per Week: 5 days    Minutes of Exercise per Session: 50 min  Stress: No Stress Concern Present (12/26/2022)   [employer] of Occupational Health - Occupational Stress Questionnaire    Feeling of Stress : Only a little  Social Connections: Socially Isolated (02/28/2023)   Social Connection and Isolation Panel [NHANES]    Frequency of Communication with Friends and Family: More than three times a week    Frequency of Social Gatherings with Friends and Family: More than three times a week    Attends Religious Services: Never    Database Administrator or Organizations: No    Attends Banker Meetings: Never    Marital Status: Divorced  Catering Manager Violence: Not At Risk (02/28/2023)   Humiliation, Afraid,  Rape, and Kick questionnaire    Fear of Current or Ex-Partner: No    Emotionally Abused: No    Physically Abused: No    Sexually Abused: No    FAMILY HISTORY:  Family History  Problem Relation Age of Onset   Stroke Father    Cirrhosis Mother    Colon cancer Neg Hx     CURRENT MEDICATIONS:  Outpatient Encounter Medications as of 03/08/2023  Medication Sig Note   albuterol  (VENTOLIN  HFA) 108 (90 Base) MCG/ACT inhaler Inhale 2 puffs into the lungs every 6 (six) hours as needed for wheezing or shortness of breath.    apixaban  (ELIQUIS ) 5 MG TABS tablet Take 1 tablet (5 mg total) by mouth 2 (two) times daily.    aspirin  EC 81 MG tablet Take 1 tablet (81 mg total) by mouth daily. Swallow whole.    atorvastatin  (LIPITOR ) 40 MG tablet Take 1 tablet (40 mg total) by mouth every evening. (Patient taking differently: Take 40 mg by mouth daily.)    Budeson-Glycopyrrol-Formoterol  (BREZTRI  AEROSPHERE) 160-9-4.8 MCG/ACT AERO Inhale 2 puffs into the lungs 2 (two) times daily.    carvedilol  (COREG ) 12.5 MG tablet Take 1 tablet (12.5 mg total) by mouth 2 (two) times daily with a meal.    feeding supplement (ENSURE ENLIVE / ENSURE PLUS) LIQD Take 237 mLs by mouth 3 (three) times daily between meals. (Patient taking differently: Take 237 mLs by mouth daily.)    furosemide  (LASIX ) 40 MG tablet Take 1 tablet (40 mg total) by mouth 2 (two) times daily.    ILEVRO  0.3 % ophthalmic suspension Place 1 drop into both eyes in the morning and at bedtime.    losartan  (COZAAR ) 25 MG tablet Take 25 mg by mouth daily.    pantoprazole  (PROTONIX ) 40 MG tablet Take 1 tablet (40 mg total) by mouth daily.    potassium chloride  SA (KLOR-CON  M) 20 MEQ tablet Take 1 tablet (20 mEq total) by mouth daily.    REPATHA  SURECLICK 140 MG/ML SOAJ Inject 1 mL into the skin every 14 (fourteen) days. 02/28/2023: Current orders but needs this refilled. Plans to follow-up with MD  during visit    nitroGLYCERIN  (NITROSTAT ) 0.4 MG SL tablet  Place 1 tablet (0.4 mg total) under the tongue every 5 (five) minutes as needed for chest pain. (Patient not taking: Reported on 03/08/2023) 02/21/2023: Unknown last dose   No facility-administered encounter medications on file as of 03/08/2023.    ALLERGIES:  No Known Allergies  LABORATORY DATA:  I have reviewed the labs as listed.  CBC    Component Value Date/Time   WBC 9.8 03/01/2023 1044   RBC 4.54 03/01/2023 1044   HGB 12.8 (L) 03/01/2023 1044   HGB 13.8 04/23/2011 0919   HCT 41.2 03/01/2023 1044   HCT 26.0 (L) 05/09/2018 0455   PLT 371 03/01/2023 1044   PLT 208 04/23/2011 0919   MCV 90.7 03/01/2023 1044   MCV 84 04/23/2011 0919   MCH 28.2 03/01/2023 1044   MCHC 31.1 03/01/2023 1044   RDW 15.7 (H) 03/01/2023 1044   RDW 12.3 04/23/2011 0919   LYMPHSABS 1.3 03/01/2023 1044   LYMPHSABS 1.1 04/23/2011 0919   MONOABS 0.9 03/01/2023 1044   MONOABS 0.9 (H) 04/23/2011 0919   EOSABS 0.3 03/01/2023 1044   EOSABS 0.0 04/23/2011 0919   BASOSABS 0.1 03/01/2023 1044   BASOSABS 0.0 04/23/2011 0919      Latest Ref Rng & Units 03/01/2023   10:44 AM 02/25/2023    6:03 AM 02/24/2023    4:44 AM  CMP  Glucose 70 - 99 mg/dL 840  869  897   BUN 8 - 23 mg/dL 24  27  28    Creatinine 0.61 - 1.24 mg/dL 8.33  8.28  8.14   Sodium 135 - 145 mmol/L 140  137  136   Potassium 3.5 - 5.1 mmol/L 3.8  4.0  3.4   Chloride 98 - 111 mmol/L 105  106  102   CO2 22 - 32 mmol/L 26  24  26    Calcium  8.9 - 10.3 mg/dL 9.4  8.7  8.6   Total Protein 6.5 - 8.1 g/dL 7.4     Total Bilirubin 0.0 - 1.2 mg/dL 0.9     Alkaline Phos 38 - 126 U/L 121     AST 15 - 41 U/L 20     ALT 0 - 44 U/L 17       DIAGNOSTIC IMAGING:  I have independently reviewed the relevant imaging and discussed with the patient.   WRAP UP:  All questions were answered. The patient knows to call the clinic with any problems, questions or concerns.  Medical decision making: Moderate  Time spent on visit: I spent 20 minutes counseling the  patient face to face. The total time spent in the appointment was 30 minutes and more than 50% was on counseling.  Pleasant CHRISTELLA Barefoot, PA-C  03/08/23 10:08 AM

## 2023-03-08 ENCOUNTER — Inpatient Hospital Stay: Payer: 59 | Attending: Hematology | Admitting: Physician Assistant

## 2023-03-08 VITALS — BP 169/97 | HR 79 | Temp 96.5°F | Resp 20 | Wt 140.9 lb

## 2023-03-08 DIAGNOSIS — D500 Iron deficiency anemia secondary to blood loss (chronic): Secondary | ICD-10-CM | POA: Diagnosis not present

## 2023-03-08 DIAGNOSIS — E559 Vitamin D deficiency, unspecified: Secondary | ICD-10-CM | POA: Diagnosis not present

## 2023-03-08 DIAGNOSIS — D509 Iron deficiency anemia, unspecified: Secondary | ICD-10-CM | POA: Diagnosis not present

## 2023-03-08 DIAGNOSIS — I10 Essential (primary) hypertension: Secondary | ICD-10-CM | POA: Diagnosis not present

## 2023-03-08 DIAGNOSIS — N1832 Chronic kidney disease, stage 3b: Secondary | ICD-10-CM | POA: Insufficient documentation

## 2023-03-08 DIAGNOSIS — D631 Anemia in chronic kidney disease: Secondary | ICD-10-CM | POA: Insufficient documentation

## 2023-03-08 DIAGNOSIS — C44311 Basal cell carcinoma of skin of nose: Secondary | ICD-10-CM

## 2023-03-08 NOTE — Patient Instructions (Addendum)
 [facility] at [facility] Discharge Instructions  You were seen today by Pleasant Barefoot PA-C for your iron  deficiency anemia.  Your blood and iron  levels remain low.   We will schedule you for 1 dose of IV iron . Please reach out to your GI doctors [facility] at [phone]) to schedule an appointment with them, since you missed your appointment in May 2024. You may be continuing to lose blood from somewhere in your GI tract, and you may need an endoscopy to look into this. Your GI doctors will also need to continue monitoring your history of colon mass.  Your blood pressure was high at your visit today.  Please make sure that you do not miss any doses of your blood pressure medications.  If your blood pressure remains high at home, please call your primary care doctor for adjustment of your blood pressure medications.  Seek immediate medical attention if you experience any chest pain, difficulty breathing, headache, dizziness, blurry vision, or strokelike symptoms.  FOLLOW-UP APPOINTMENT: Office visit in 4 months, after labs   Thank you for choosing [facility] at [facility] to provide your oncology and hematology care.  To afford each patient quality time with our provider, please arrive at least 15 minutes before your scheduled appointment time.   If you have a lab appointment with the Cancer Center please come in thru the Main Entrance and check in at the main information desk.  You need to re-schedule your appointment should you arrive 10 or more minutes late.  We strive to give you quality time with our providers, and arriving late affects you and other patients whose appointments are after yours.  Also, if you no show three or more times for appointments you may be dismissed from the clinic at the providers discretion.     Again, thank you for choosing [facility].  Our hope is that these requests  will decrease the amount of time that you wait before being seen by our physicians.       _____________________________________________________________  Should you have questions after your visit to [facility], please contact our office at [phone] and follow the prompts.  Our office hours are 8:00 a.m. and 4:30 p.m. Monday - Friday.  Please note that voicemails left after 4:00 p.m. may not be returned until the following business day.  We are closed weekends and major holidays.  You do have access to a nurse 24-7, just call the main number to the clinic [phone] and do not press any options, hold on the line and a nurse will answer the phone.    For prescription refill requests, have your pharmacy contact our office and allow 72 hours.    Due to Covid, you will need to wear a mask upon entering the hospital. If you do not have a mask, a mask will be given to you at the Main Entrance upon arrival. For doctor visits, patients may have 1 support person age 75 or older with them. For treatment visits, patients can not have anyone with them due to social distancing guidelines and our immunocompromised population.

## 2023-03-09 ENCOUNTER — Ambulatory Visit: Payer: 59 | Admitting: Student

## 2023-03-09 NOTE — Progress Notes (Deleted)
   Cardiology Office Note    Date:  03/09/2023  ID:  Michael [name], [place] 1956-01-17, MRN [id number] Cardiologist: Jayson Sierras, MD    History of Present Illness:    Michael [name] is a 68 y.o. male with a hx of chronic HFrEF (EF 30% in 10/2021, at 35-40% in 04/2022, 25-30% in 07/2022 and 25% in 11/2022), CAD (cath in 10/2021 showing mild to moderate obstructive CAD along mid-RCA and acute Mrg with medical management recommended), carotid artery stenosis (s/p right TCAR in 01/2021), iron  deficiency anemia, HTN (complicated by orthostatic hypotension), HLD, COPD and Stage 3 CKD who presents to the office today for hospital follow-up.  He was recently hospitalized at [facility] in 02/2023 for an acute CHF exacerbation.  He responded well with IV Lasix  and weight had improved to 61.6 kg at discharge.  Was transition to Lasix  40 mg twice daily at discharge. He was continued on Coreg  and Losartan  for his cardiomyopathy as he previously had worsening orthostasis with Entresto  and had recurrent UTI's with SGLT2 inhibitors in the past.  ROS: ***  Studies Reviewed:   EKG: EKG is*** ordered today and demonstrates ***   EKG Interpretation Date/Time:    Ventricular Rate:    PR Interval:    QRS Duration:    QT Interval:    QTC Calculation:   R Axis:      Text Interpretation:           Risk Assessment/Calculations:   {Does this patient have ATRIAL FIBRILLATION?:[id number]}           Physical Exam:   VS:  There were no vitals taken for this visit.   Wt Readings from Last 3 Encounters:  03/08/23 140 lb 14 oz (63.9 kg)  02/25/23 135 lb 3.2 oz (61.3 kg)  02/05/23 146 lb (66.2 kg)     GEN: Well nourished, well developed in no acute distress NECK: No JVD; No carotid bruits CARDIAC: ***RRR, no murmurs, rubs, gallops RESPIRATORY:  Clear to auscultation without rales, wheezing or rhonchi  ABDOMEN: Appears non-distended. No obvious abdominal masses. EXTREMITIES: No clubbing or  cyanosis. No edema.  Distal pedal pulses are 2+ bilaterally.   Assessment and Plan:   1. Chronic HFrEF - ***  2. CAD/HLD -Prior cardiac catheterization 10/2021 showed mild to moderate nonobstructive CAD with medical management recommended.  3. Paroxysmal Atrial Fibrillation - ***  4. Stage 3 CKD - Baseline creatinine 1.9-2.0. ***  5. Carotid Artery Stenosis - He underwent right TCAR in 01/2021 and recent Dopplers in 11/2022 showed no hemodynamically significant stenosis along the R ICA but was noted to have a moderate to large amount of left-sided plaque which is similar to prior imaging.  Signed, Laymon CHRISTELLA Qua, PA-C

## 2023-03-10 ENCOUNTER — Telehealth: Payer: Self-pay | Admitting: Student

## 2023-03-10 ENCOUNTER — Encounter: Payer: Self-pay | Admitting: Student

## 2023-03-10 ENCOUNTER — Ambulatory Visit: Payer: 59 | Attending: Student | Admitting: Student

## 2023-03-10 VITALS — BP 130/82 | HR 75 | Ht 72.0 in | Wt 141.6 lb

## 2023-03-10 DIAGNOSIS — N1832 Chronic kidney disease, stage 3b: Secondary | ICD-10-CM

## 2023-03-10 DIAGNOSIS — E785 Hyperlipidemia, unspecified: Secondary | ICD-10-CM

## 2023-03-10 DIAGNOSIS — I6523 Occlusion and stenosis of bilateral carotid arteries: Secondary | ICD-10-CM

## 2023-03-10 DIAGNOSIS — Z79899 Other long term (current) drug therapy: Secondary | ICD-10-CM | POA: Diagnosis not present

## 2023-03-10 DIAGNOSIS — I2510 Atherosclerotic heart disease of native coronary artery without angina pectoris: Secondary | ICD-10-CM

## 2023-03-10 DIAGNOSIS — I5020 Unspecified systolic (congestive) heart failure: Secondary | ICD-10-CM

## 2023-03-10 DIAGNOSIS — I480 Paroxysmal atrial fibrillation: Secondary | ICD-10-CM

## 2023-03-10 MED ORDER — TORSEMIDE 20 MG PO TABS: 20.0000 mg | ORAL_TABLET | Freq: Two times a day (BID) | ORAL | 3 refills | Status: DC

## 2023-03-10 NOTE — Telephone Encounter (Signed)
 I have not finished his note yet but he should be on Torsemide  20mg  BID. No Lasix . I wrote that on his AVS as well.  Thanks, [place] at [employer] informed and says she will deactivate lasix  from profile.

## 2023-03-10 NOTE — Patient Instructions (Signed)
 Medication Instructions:   Please review your medication list upon returning home. [facility] has you listed as taking Torsemide  20mg  twice daily. Should not be taking Lasix  (Furosemide ) while taking Torsemide  as they are both fluid pills.    *If you need a refill on your cardiac medications before your next appointment, please call your pharmacy*   Lab Work:  Basic Metabolic Panel in 2 weeks.   If you have labs (blood work) drawn today and your tests are completely normal, you will receive your results only by: MyChart Message (if you have MyChart) OR A paper copy in the mail If you have any lab test that is abnormal or we need to change your treatment, we will call you to review the results.  Follow-Up: At [facility], you and your health needs are our priority.  As part of our continuing mission to provide you with exceptional heart care, we have created designated Provider Care Teams.  These Care Teams include your primary Cardiologist (physician) and Advanced Practice Providers (APPs -  Physician Assistants and Nurse Practitioners) who all work together to provide you with the care you need, when you need it.  We recommend signing up for the patient portal called MyChart.  Sign up information is provided on this After Visit Summary.  MyChart is used to connect with patients for Virtual Visits (Telemedicine).  Patients are able to view lab/test results, encounter notes, upcoming appointments, etc.  Non-urgent messages can be sent to your provider as well.   To learn more about what you can do with MyChart, go to [email or web address].    Your next appointment:   3-4 months with Laymon Qua, PA or Dr. Debera

## 2023-03-10 NOTE — Telephone Encounter (Signed)
 Pt c/o medication issue:  1. Name of Medication: torsemide  (DEMADEX ) 20 MG tablet    2. How are you currently taking this medication (dosage and times per day)?   3. Are you having a reaction (difficulty breathing--STAT)?   4. What is your medication issue? Pt also was prescribed Lasix  40mg  by Zelda Salmon which was picked up. Requesting cb

## 2023-03-10 NOTE — Telephone Encounter (Signed)
 Spoke with Hoy at [facility] and says patient filled furosemide  40 mg BID #180 on 02/25/2023 (Dr.Taye T. Gonsa & torsemide  20 mg BID #30 (Dr. Milford office) 02/23/2023. Please clarify which diuretic patient should be taking. Per Hoy, she will deactivate med he should no longer be taking.

## 2023-03-10 NOTE — Progress Notes (Signed)
 Cardiology Office Note    Date:  03/11/2023  ID:  Michael [name], [place] Nov 28, 1955, MRN [id number] Cardiologist: Jayson Sierras, MD    History of Present Illness:    Michael [name] is a 68 y.o. male with a hx of chronic HFrEF (EF 30% in 10/2021, at 35-40% in 04/2022, 25-30% in 07/2022 and 25% in 11/2022), CAD (cath in 10/2021 showing mild to moderate obstructive CAD along mid-RCA and acute Mrg with medical management recommended), carotid artery stenosis (s/p right TCAR in 01/2021), iron  deficiency anemia, HTN (complicated by orthostatic hypotension), HLD, COPD and Stage 3 CKD who presents to the office today for hospital follow-up.  He was recently hospitalized at [facility] in 02/2023 for an acute CHF exacerbation. He responded well to IV Lasix  and weight had improved to 61.6 kg at discharge. Was transitioned to Lasix  40 mg twice daily at discharge. He was continued on Coreg  and Losartan  for his cardiomyopathy as he previously had worsening orthostasis with Entresto  and had recurrent UTI's with SGLT2 inhibitors in the past.  In talking with the patient today, he reports still having chronic fatigue and dyspnea which has overall been unchanged.  Denies any specific orthopnea, PND or lower extremity edema. No recent exertional chest pain or palpitations. He is unsure of his current medications and our office did contact his pharmacy today and he is listed as taking both Lasix  40 mg daily and Torsemide  20 mg twice daily. We reviewed that he should only be on one diuretic and he previously responded better to Torsemide  in the past. He has not had follow-up labs since hospital discharge.  Studies Reviewed:   EKG: EKG is not ordered today. EKG from 02/21/2023 is reviewed and shows NSR, HR 76 with PVC's and TWI along lateral leads.   Limited Echocardiogram: 11/2022 IMPRESSIONS     1. No change in severely reduced EF from TTE done 08/16/22 Limited echo No  mural apical thrombus. Left  ventricular ejection fraction, by estimation,  is 25%. The left ventricle demonstrates global hypokinesis. The left  ventricular internal cavity size was   severely dilated.   2. Right ventricular systolic function is mildly reduced. The right  ventricular size is mildly enlarged.   3. The mitral valve is abnormal. Mild mitral valve regurgitation.    Risk Assessment/Calculations:    CHA2DS2-VASc Score = 4   This indicates a 4.8% annual risk of stroke. The patient's score is based upon: CHF History: 1 HTN History: 1 Diabetes History: 0 Stroke History: 0 Vascular Disease History: 1 Age Score: 1 Gender Score: 0    Physical Exam:   VS:  BP 130/82 (BP Location: Left Arm, Patient Position: Sitting, Cuff Size: Normal)   Pulse 75   Ht 6' (1.829 m)   Wt 141 lb 9.6 oz (64.2 kg)   SpO2 100%   BMI 19.20 kg/m    Wt Readings from Last 3 Encounters:  03/10/23 141 lb 9.6 oz (64.2 kg)  03/08/23 140 lb 14 oz (63.9 kg)  02/25/23 135 lb 3.2 oz (61.3 kg)     GEN: Elderly male appearing in no acute distress. Prior nasal surgery and presumably recurrent skin cancer noted (followed by Dermatology). NECK: No JVD; No carotid bruits CARDIAC: RRR, no murmurs, rubs, gallops RESPIRATORY: No wheezing or rales. Scattered rhonchi.  ABDOMEN: Appears non-distended. No obvious abdominal masses. EXTREMITIES: No clubbing or cyanosis. No pitting edema.  Distal pedal pulses are 2+ bilaterally.   Assessment and Plan:   1. Chronic  HFrEF - His weight has overall been stable since his recent hospitalization and he does not appear volume overloaded by examination today. He is unsure of his current medications but is listed as taking Coreg  12.5 mg twice daily and Losartan  25 mg daily. Would continue for his cardiomyopathy as he previously had significant orthostasis/syncope with Entresto  and had recurrent UTI's with SGLT2 inhibitors in the past. As discussed above, he has been taking both Lasix  and Torsemide .  Recommended stopping Lasix  and continuing with Torsemide  20 mg twice daily. Will recheck renal function and electrolytes. Could possibly restart Spironolactone in the future pending his BP trend and follow-up lab results. - Previously not felt to be an ICD candidate given medical noncompliance.  2. CAD/HLD - Prior cardiac catheterization in 10/2021 showed mild to moderate nonobstructive CAD with medical management recommended. He has baseline dyspnea but no recent chest pain. Continue with risk factor modification. He is on ASA 81 mg daily, Coreg  12.5 mg twice daily, Atorvastatin  40 mg daily and Repatha . LDL was previously 18 when checked in 07/2022.  3. Paroxysmal Atrial Fibrillation - He denies any recent palpitations and is in normal sinus rhythm by examination today. Remains on Coreg  12.5mg  twice daily for rate control. - No reports of active bleeding. On Eliquis  5mg  BID for anticoagulation which is the appropriate dose given his age, weight and renal function as the only indication for reduced dosing at this time is renal function.  4. Stage 3 CKD - Baseline creatinine 1.9-2.0. Improved during his recent admission and creatinine at 1.66 on 03/01/2023. Will recheck a BMET.   5. Carotid Artery Stenosis - He underwent right TCAR in 01/2021 and recent dopplers in 11/2022 showed no hemodynamically significant stenosis along the RICA but was noted to have a moderate to large amount of left-sided plaque which is similar to prior imaging. Would plan for repeat imaging later this year. Remains on ASA 81 mg daily, Atorvastatin  40 mg daily and Repatha .  Signed, Michael CHRISTELLA Qua, PA-C

## 2023-03-11 ENCOUNTER — Encounter: Payer: Self-pay | Admitting: Student

## 2023-03-13 ENCOUNTER — Inpatient Hospital Stay: Payer: 59

## 2023-03-13 VITALS — BP 134/85 | HR 55 | Temp 96.4°F | Resp 20

## 2023-03-13 DIAGNOSIS — E559 Vitamin D deficiency, unspecified: Secondary | ICD-10-CM | POA: Diagnosis not present

## 2023-03-13 DIAGNOSIS — D631 Anemia in chronic kidney disease: Secondary | ICD-10-CM | POA: Diagnosis not present

## 2023-03-13 DIAGNOSIS — D500 Iron deficiency anemia secondary to blood loss (chronic): Secondary | ICD-10-CM

## 2023-03-13 DIAGNOSIS — D509 Iron deficiency anemia, unspecified: Secondary | ICD-10-CM | POA: Diagnosis not present

## 2023-03-13 DIAGNOSIS — N1832 Chronic kidney disease, stage 3b: Secondary | ICD-10-CM | POA: Diagnosis not present

## 2023-03-13 MED ORDER — SODIUM CHLORIDE 0.9 % IV SOLN: INTRAVENOUS | Status: DC

## 2023-03-13 MED ORDER — CETIRIZINE HCL 10 MG PO TABS
10.0000 mg | ORAL_TABLET | Freq: Once | ORAL | Status: AC
  Administered 2023-03-13: 10 mg via ORAL
  Filled 2023-03-13: qty 1

## 2023-03-13 MED ORDER — LOSARTAN POTASSIUM 25 MG PO TABS
25.0000 mg | ORAL_TABLET | Freq: Once | ORAL | Status: AC
  Administered 2023-03-13: 25 mg via ORAL
  Filled 2023-03-13: qty 1

## 2023-03-13 MED ORDER — SODIUM CHLORIDE 0.9 % IV SOLN
400.0000 mg | Freq: Once | INTRAVENOUS | Status: AC
  Administered 2023-03-13: 400 mg via INTRAVENOUS
  Filled 2023-03-13: qty 400

## 2023-03-13 MED ORDER — ACETAMINOPHEN 325 MG PO TABS
650.0000 mg | ORAL_TABLET | Freq: Once | ORAL | Status: AC
  Administered 2023-03-13: 650 mg via ORAL
  Filled 2023-03-13: qty 2

## 2023-03-13 MED ORDER — CARVEDILOL 12.5 MG PO TABS
12.5000 mg | ORAL_TABLET | Freq: Once | ORAL | Status: AC
Start: 2023-03-13 — End: 2023-03-13
  Administered 2023-03-13: 12.5 mg via ORAL
  Filled 2023-03-13: qty 1

## 2023-03-13 NOTE — Progress Notes (Signed)
 Patient presents today for iron  infusion.  Patient is in satisfactory condition with no new complaints voiced.  BP elevated at arrival.  Patient is asymptomatic.  BP still elevated at recheck.  Sheril Dines, PA-C made aware.  Ok per PA to proceed with iron .  Patient has a hx of noncompliance with BP medication.  Patient states that he did not take BP medication this morning.  We will give him Coreg  12.5 PO x one dose and Losartan  25 mg PO x one dose today per verbal orders by Sheril Dines, PA-C.  All other vital signs are stable.  IV placed in R arm. IV flushed well with good blood return noted.  We will proceed with infusion per provider orders.    BP 147/79 at 30 minute recheck.   Patient tolerated infusion well with no complaints voiced.  Patient left ambulatory in stable condition.  Vital signs stable at discharge.  Follow up as scheduled.

## 2023-03-13 NOTE — Patient Instructions (Signed)
 [facility] [place] - A DEPT OF [facility]  Discharge Instructions: Thank you for choosing [facility] to provide your oncology and hematology care.  If you have a lab appointment with the Cancer Center - please note that after April 8th, 2024, all labs will be drawn in the cancer center.  You do not have to check in or register with the main entrance as you have in the past but will complete your check-in in the cancer center.  Wear comfortable clothing and clothing appropriate for easy access to any Portacath or PICC line.   We strive to give you quality time with your provider. You may need to reschedule your appointment if you arrive late (15 or more minutes).  Arriving late affects you and other patients whose appointments are after yours.  Also, if you miss three or more appointments without notifying the office, you may be dismissed from the clinic at the provider's discretion.      For prescription refill requests, have your pharmacy contact our office and allow 72 hours for refills to be completed.    Today you received the following:  Venofer.    Iron Sucrose Injection What is this medication? IRON SUCROSE (EYE ern SOO krose) treats low levels of iron (iron deficiency anemia) in people with kidney disease. Iron is a mineral that plays an important role in making red blood cells, which carry oxygen from your lungs to the rest of your body. This medicine may be used for other purposes; ask your health care provider or pharmacist if you have questions. COMMON BRAND NAME(S): Venofer What should I tell my care team before I take this medication? They need to know if you have any of these conditions: Anemia not caused by low iron levels Heart disease High levels of iron in the blood Kidney disease Liver disease An unusual or allergic reaction to iron, other medications, foods, dyes, or preservatives Pregnant or trying to get  pregnant Breastfeeding How should I use this medication? This medication is infused into a vein. It is given by your care team in a hospital or clinic setting. Talk to your care team about the use of this medication in children. While it may be prescribed for children as young as 2 years for selected conditions, precautions do apply. Overdosage: If you think you have taken too much of this medicine contact a poison control center or emergency room at once. NOTE: This medicine is only for you. Do not share this medicine with others. What if I miss a dose? Keep appointments for follow-up doses. It is important not to miss your dose. Call your care team if you are unable to keep an appointment. What may interact with this medication? Do not take this medication with any of the following: Deferoxamine Dimercaprol Other iron products This medication may also interact with the following: Chloramphenicol Deferasirox This list may not describe all possible interactions. Give your health care provider a list of all the medicines, herbs, non-prescription drugs, or dietary supplements you use. Also tell them if you smoke, drink alcohol, or use illegal drugs. Some items may interact with your medicine. What should I watch for while using this medication? Visit your care team for regular checks on your progress. Tell your care team if your symptoms do not start to get better or if they get worse. You may need blood work done while you are taking this medication. You may need to eat  more foods that contain iron. Talk to your care team. Foods that contain iron include whole grains or cereals, dried fruits, beans, peas, leafy green vegetables, and organ meats (liver, kidney). What side effects may I notice from receiving this medication? Side effects that you should report to your care team as soon as possible: Allergic reactions--skin rash, itching, hives, swelling of the face, lips, tongue, or throat Low  blood pressure--dizziness, feeling faint or lightheaded, blurry vision Shortness of breath Side effects that usually do not require medical attention (report to your care team if they continue or are bothersome): Flushing Headache Joint pain Muscle pain Nausea Pain, redness, or irritation at injection site This list may not describe all possible side effects. Call your doctor for medical advice about side effects. You may report side effects to FDA at 1-800-FDA-1088. Where should I keep my medication? This medication is given in a hospital or clinic. It will not be stored at home. NOTE: This sheet is a summary. It may not cover all possible information. If you have questions about this medicine, talk to your doctor, pharmacist, or health care provider.  2024 Elsevier/Gold Standard (2022-09-07 00:00:00)   To help prevent nausea and vomiting after your treatment, we encourage you to take your nausea medication as directed.  BELOW ARE SYMPTOMS THAT SHOULD BE REPORTED IMMEDIATELY: *FEVER GREATER THAN 100.4 F (38 C) OR HIGHER *CHILLS OR SWEATING *NAUSEA AND VOMITING THAT IS NOT CONTROLLED WITH YOUR NAUSEA MEDICATION *UNUSUAL SHORTNESS OF BREATH *UNUSUAL BRUISING OR BLEEDING *URINARY PROBLEMS (pain or burning when urinating, or frequent urination) *BOWEL PROBLEMS (unusual diarrhea, constipation, pain near the anus) TENDERNESS IN MOUTH AND THROAT WITH OR WITHOUT PRESENCE OF ULCERS (sore throat, sores in mouth, or a toothache) UNUSUAL RASH, SWELLING OR PAIN  UNUSUAL VAGINAL DISCHARGE OR ITCHING   Items with * indicate a potential emergency and should be followed up as soon as possible or go to the Emergency Department if any problems should occur.  Please show the CHEMOTHERAPY ALERT CARD or IMMUNOTHERAPY ALERT CARD at check-in to the Emergency Department and triage nurse.  Should you have questions after your visit or need to cancel or reschedule your appointment, please contact [facility] CANCER  CT[facility] [place] - A DEPT OF [facility] [phone]  and follow the prompts.  Office hours are 8:00 a.m. to 4:30 p.m. Monday - Friday. Please note that voicemails left after 4:00 p.m. may not be returned until the following business day.  We are closed weekends and major holidays. You have access to a nurse at all times for urgent questions. Please call the main number to the clinic [phone] and follow the prompts.  For any non-urgent questions, you may also contact your provider using MyChart. We now offer e-Visits for anyone 24 and older to request care online for non-urgent symptoms. For details visit [email or web address].   Also download the MyChart app! Go to the app store, search "MyChart", open the app, select [place], and log in with your MyChart username and password.

## 2023-03-16 ENCOUNTER — Ambulatory Visit: Payer: 59 | Admitting: Student

## 2023-03-22 ENCOUNTER — Ambulatory Visit (HOSPITAL_COMMUNITY): Payer: 59

## 2023-03-30 ENCOUNTER — Ambulatory Visit: Payer: Self-pay | Admitting: *Deleted

## 2023-03-30 NOTE — Patient Outreach (Signed)
 Care Coordination   03/30/2023 Name: Michael [name] MRN: [id number] DOB: 1955/09/17   Care Coordination Outreach Attempts:  An unsuccessful outreach was attempted for an appointment today.  Follow Up Plan:  Additional outreach attempts will be made to offer the patient complex care management information and services.   Encounter Outcome:  Patient Request to Call Back   Care Coordination Interventions:  No, not indicated  Caregiver [name] requested at return call back     [place] L. Noelle Penner, RN, BSN, CCM [place]  [facility], [facility] RN Care Manager Direct Dial: [phone]  Fax: [phone] Mailing Address: [address]  [place] [address] Website: [email or web address]

## 2023-03-31 ENCOUNTER — Inpatient Hospital Stay (HOSPITAL_COMMUNITY)
Admission: EM | Admit: 2023-03-31 | Discharge: 2023-04-06 | DRG: 291 | Disposition: A | Discharge status: Home / Self Care | Payer: 59 | Attending: Internal Medicine | Admitting: Internal Medicine

## 2023-03-31 ENCOUNTER — Encounter (HOSPITAL_COMMUNITY): Payer: Self-pay | Admitting: *Deleted

## 2023-03-31 ENCOUNTER — Other Ambulatory Visit: Payer: Self-pay

## 2023-03-31 ENCOUNTER — Emergency Department (HOSPITAL_COMMUNITY): Payer: 59

## 2023-03-31 DIAGNOSIS — I509 Heart failure, unspecified: Principal | ICD-10-CM

## 2023-03-31 DIAGNOSIS — I6521 Occlusion and stenosis of right carotid artery: Secondary | ICD-10-CM | POA: Diagnosis present

## 2023-03-31 DIAGNOSIS — I5043 Acute on chronic combined systolic (congestive) and diastolic (congestive) heart failure: Secondary | ICD-10-CM | POA: Diagnosis not present

## 2023-03-31 DIAGNOSIS — Z85828 Personal history of other malignant neoplasm of skin: Secondary | ICD-10-CM

## 2023-03-31 DIAGNOSIS — N189 Chronic kidney disease, unspecified: Secondary | ICD-10-CM | POA: Diagnosis present

## 2023-03-31 DIAGNOSIS — Z91119 Patient's noncompliance with dietary regimen due to unspecified reason: Secondary | ICD-10-CM

## 2023-03-31 DIAGNOSIS — Z7901 Long term (current) use of anticoagulants: Secondary | ICD-10-CM

## 2023-03-31 DIAGNOSIS — I2489 Other forms of acute ischemic heart disease: Secondary | ICD-10-CM | POA: Diagnosis present

## 2023-03-31 DIAGNOSIS — I2510 Atherosclerotic heart disease of native coronary artery without angina pectoris: Secondary | ICD-10-CM | POA: Diagnosis present

## 2023-03-31 DIAGNOSIS — I130 Hypertensive heart and chronic kidney disease with heart failure and stage 1 through stage 4 chronic kidney disease, or unspecified chronic kidney disease: Secondary | ICD-10-CM | POA: Diagnosis present

## 2023-03-31 DIAGNOSIS — E876 Hypokalemia: Secondary | ICD-10-CM | POA: Diagnosis present

## 2023-03-31 DIAGNOSIS — F17210 Nicotine dependence, cigarettes, uncomplicated: Secondary | ICD-10-CM | POA: Diagnosis present

## 2023-03-31 DIAGNOSIS — J439 Emphysema, unspecified: Secondary | ICD-10-CM | POA: Diagnosis present

## 2023-03-31 DIAGNOSIS — Z79899 Other long term (current) drug therapy: Secondary | ICD-10-CM

## 2023-03-31 DIAGNOSIS — E782 Mixed hyperlipidemia: Secondary | ICD-10-CM | POA: Diagnosis present

## 2023-03-31 DIAGNOSIS — Z961 Presence of intraocular lens: Secondary | ICD-10-CM | POA: Diagnosis present

## 2023-03-31 DIAGNOSIS — J449 Chronic obstructive pulmonary disease, unspecified: Secondary | ICD-10-CM | POA: Diagnosis present

## 2023-03-31 DIAGNOSIS — R6889 Other general symptoms and signs: Secondary | ICD-10-CM | POA: Diagnosis not present

## 2023-03-31 DIAGNOSIS — I700 Atherosclerosis of aorta: Secondary | ICD-10-CM | POA: Diagnosis present

## 2023-03-31 DIAGNOSIS — I480 Paroxysmal atrial fibrillation: Secondary | ICD-10-CM | POA: Diagnosis present

## 2023-03-31 DIAGNOSIS — I110 Hypertensive heart disease with heart failure: Secondary | ICD-10-CM | POA: Diagnosis not present

## 2023-03-31 DIAGNOSIS — N1832 Chronic kidney disease, stage 3b: Secondary | ICD-10-CM | POA: Diagnosis not present

## 2023-03-31 DIAGNOSIS — Z8589 Personal history of malignant neoplasm of other organs and systems: Secondary | ICD-10-CM | POA: Diagnosis not present

## 2023-03-31 DIAGNOSIS — N179 Acute kidney failure, unspecified: Secondary | ICD-10-CM | POA: Diagnosis present

## 2023-03-31 DIAGNOSIS — Z9842 Cataract extraction status, left eye: Secondary | ICD-10-CM | POA: Diagnosis not present

## 2023-03-31 DIAGNOSIS — Z743 Need for continuous supervision: Secondary | ICD-10-CM | POA: Diagnosis not present

## 2023-03-31 DIAGNOSIS — Z7982 Long term (current) use of aspirin: Secondary | ICD-10-CM | POA: Diagnosis not present

## 2023-03-31 DIAGNOSIS — Z1152 Encounter for screening for COVID-19: Secondary | ICD-10-CM

## 2023-03-31 DIAGNOSIS — I4891 Unspecified atrial fibrillation: Secondary | ICD-10-CM | POA: Diagnosis not present

## 2023-03-31 DIAGNOSIS — R0989 Other specified symptoms and signs involving the circulatory and respiratory systems: Secondary | ICD-10-CM | POA: Diagnosis not present

## 2023-03-31 DIAGNOSIS — I499 Cardiac arrhythmia, unspecified: Secondary | ICD-10-CM | POA: Diagnosis not present

## 2023-03-31 DIAGNOSIS — I21A1 Myocardial infarction type 2: Secondary | ICD-10-CM | POA: Diagnosis present

## 2023-03-31 DIAGNOSIS — R001 Bradycardia, unspecified: Secondary | ICD-10-CM | POA: Diagnosis not present

## 2023-03-31 DIAGNOSIS — R0602 Shortness of breath: Secondary | ICD-10-CM | POA: Diagnosis not present

## 2023-03-31 DIAGNOSIS — T454X6A Underdosing of iron and its compounds, initial encounter: Secondary | ICD-10-CM | POA: Diagnosis present

## 2023-03-31 DIAGNOSIS — R0689 Other abnormalities of breathing: Secondary | ICD-10-CM | POA: Diagnosis not present

## 2023-03-31 DIAGNOSIS — D509 Iron deficiency anemia, unspecified: Secondary | ICD-10-CM | POA: Diagnosis present

## 2023-03-31 DIAGNOSIS — I517 Cardiomegaly: Secondary | ICD-10-CM | POA: Diagnosis not present

## 2023-03-31 DIAGNOSIS — Z91148 Patient's other noncompliance with medication regimen for other reason: Secondary | ICD-10-CM

## 2023-03-31 DIAGNOSIS — R1110 Vomiting, unspecified: Secondary | ICD-10-CM | POA: Diagnosis not present

## 2023-03-31 DIAGNOSIS — E890 Postprocedural hypothyroidism: Secondary | ICD-10-CM | POA: Diagnosis present

## 2023-03-31 LAB — CBC WITH DIFFERENTIAL/PLATELET
Abs Immature Granulocytes: 0.03 10*3/uL (ref 0.00–0.07)
Basophils Absolute: 0.1 10*3/uL (ref 0.0–0.1)
Basophils Relative: 1 %
Eosinophils Absolute: 0.3 10*3/uL (ref 0.0–0.5)
Eosinophils Relative: 3 %
HCT: 38.8 % — ABNORMAL LOW (ref 39.0–52.0)
Hemoglobin: 12 g/dL — ABNORMAL LOW (ref 13.0–17.0)
Immature Granulocytes: 0 %
Lymphocytes Relative: 15 %
Lymphs Abs: 1.1 10*3/uL (ref 0.7–4.0)
MCH: 27.8 pg (ref 26.0–34.0)
MCHC: 30.9 g/dL (ref 30.0–36.0)
MCV: 90 fL (ref 80.0–100.0)
Monocytes Absolute: 0.5 10*3/uL (ref 0.1–1.0)
Monocytes Relative: 7 %
Neutro Abs: 5.6 10*3/uL (ref 1.7–7.7)
Neutrophils Relative %: 74 %
Platelets: 282 10*3/uL (ref 150–400)
RBC: 4.31 MIL/uL (ref 4.22–5.81)
RDW: 15.3 % (ref 11.5–15.5)
WBC: 7.6 10*3/uL (ref 4.0–10.5)
nRBC: 0 % (ref 0.0–0.2)

## 2023-03-31 LAB — BASIC METABOLIC PANEL
Anion gap: 12 (ref 5–15)
BUN: 27 mg/dL — ABNORMAL HIGH (ref 8–23)
CO2: 24 mmol/L (ref 22–32)
Calcium: 9.1 mg/dL (ref 8.9–10.3)
Chloride: 104 mmol/L (ref 98–111)
Creatinine, Ser: 2.05 mg/dL — ABNORMAL HIGH (ref 0.61–1.24)
GFR, Estimated: 35 mL/min — ABNORMAL LOW (ref >60)
Glucose, Bld: 109 mg/dL — ABNORMAL HIGH (ref 70–99)
Potassium: 3.4 mmol/L — ABNORMAL LOW (ref 3.5–5.1)
Sodium: 140 mmol/L (ref 135–145)

## 2023-03-31 LAB — TROPONIN I (HIGH SENSITIVITY)
Troponin I (High Sensitivity): 65 ng/L — ABNORMAL HIGH (ref <18)
Troponin I (High Sensitivity): 74 ng/L — ABNORMAL HIGH (ref <18)

## 2023-03-31 LAB — BRAIN NATRIURETIC PEPTIDE: B Natriuretic Peptide: >4500 pg/mL — ABNORMAL HIGH (ref 0.0–100.0)

## 2023-03-31 LAB — RESP PANEL BY RT-PCR (RSV, FLU A&B, COVID)  RVPGX2
Influenza A by PCR: NEGATIVE
Influenza B by PCR: NEGATIVE
Resp Syncytial Virus by PCR: NEGATIVE
SARS Coronavirus 2 by RT PCR: NEGATIVE

## 2023-03-31 MED ORDER — FUROSEMIDE 10 MG/ML IJ SOLN
40.0000 mg | Freq: Once | INTRAMUSCULAR | Status: AC
  Administered 2023-03-31: 40 mg via INTRAVENOUS
  Filled 2023-03-31: qty 4

## 2023-03-31 MED ORDER — PANTOPRAZOLE SODIUM 40 MG PO TBEC
40.0000 mg | DELAYED_RELEASE_TABLET | Freq: Every day | ORAL | Status: DC
  Administered 2023-04-01 – 2023-04-06 (×6): 40 mg via ORAL
  Filled 2023-03-31 (×6): qty 1

## 2023-03-31 MED ORDER — APIXABAN 5 MG PO TABS
5.0000 mg | ORAL_TABLET | Freq: Two times a day (BID) | ORAL | Status: DC
  Administered 2023-03-31 – 2023-04-06 (×12): 5 mg via ORAL
  Filled 2023-03-31 (×12): qty 1

## 2023-03-31 MED ORDER — BUDESON-GLYCOPYRROL-FORMOTEROL 160-9-4.8 MCG/ACT IN AERO: 2.0000 | INHALATION_SPRAY | Freq: Two times a day (BID) | RESPIRATORY_TRACT | Status: DC

## 2023-03-31 MED ORDER — HYDRALAZINE HCL 20 MG/ML IJ SOLN
10.0000 mg | Freq: Four times a day (QID) | INTRAMUSCULAR | Status: DC | PRN
  Administered 2023-03-31: 10 mg via INTRAVENOUS
  Filled 2023-03-31: qty 1

## 2023-03-31 MED ORDER — ACETAMINOPHEN 325 MG PO TABS: 650.0000 mg | ORAL_TABLET | Freq: Four times a day (QID) | ORAL | Status: DC | PRN

## 2023-03-31 MED ORDER — ALBUTEROL SULFATE (2.5 MG/3ML) 0.083% IN NEBU: 2.5000 mg | INHALATION_SOLUTION | RESPIRATORY_TRACT | Status: DC | PRN

## 2023-03-31 MED ORDER — UMECLIDINIUM BROMIDE 62.5 MCG/ACT IN AEPB
1.0000 | INHALATION_SPRAY | Freq: Every day | RESPIRATORY_TRACT | Status: DC
  Filled 2023-03-31: qty 7

## 2023-03-31 MED ORDER — CARVEDILOL 12.5 MG PO TABS
12.5000 mg | ORAL_TABLET | Freq: Two times a day (BID) | ORAL | Status: DC
  Administered 2023-04-01 – 2023-04-03 (×5): 12.5 mg via ORAL
  Filled 2023-03-31 (×5): qty 1

## 2023-03-31 MED ORDER — ATORVASTATIN CALCIUM 40 MG PO TABS
40.0000 mg | ORAL_TABLET | Freq: Every day | ORAL | Status: DC
  Administered 2023-04-01 – 2023-04-06 (×6): 40 mg via ORAL
  Filled 2023-03-31 (×6): qty 1

## 2023-03-31 MED ORDER — FUROSEMIDE 10 MG/ML IJ SOLN
40.0000 mg | Freq: Two times a day (BID) | INTRAMUSCULAR | Status: DC
  Administered 2023-04-01 – 2023-04-06 (×11): 40 mg via INTRAVENOUS
  Filled 2023-03-31 (×11): qty 4

## 2023-03-31 MED ORDER — POTASSIUM CHLORIDE CRYS ER 20 MEQ PO TBCR
40.0000 meq | EXTENDED_RELEASE_TABLET | Freq: Four times a day (QID) | ORAL | Status: AC
  Administered 2023-03-31 – 2023-04-01 (×2): 40 meq via ORAL
  Filled 2023-03-31 (×2): qty 2

## 2023-03-31 MED ORDER — FLUTICASONE FUROATE-VILANTEROL 200-25 MCG/ACT IN AEPB
1.0000 | INHALATION_SPRAY | Freq: Every day | RESPIRATORY_TRACT | Status: DC
  Filled 2023-03-31: qty 28

## 2023-03-31 MED ORDER — ACETAMINOPHEN 650 MG RE SUPP: 650.0000 mg | Freq: Four times a day (QID) | RECTAL | Status: DC | PRN

## 2023-03-31 NOTE — H&P (Signed)
 [facility] H&P   Patient Demographics:    Michael [name], is a 68 y.o. male  MRN: [id number]   DOB - 12-Mar-1955  Admit Date - 03/31/2023  Outpatient Primary MD for the patient is Benita Stabile, MD  Referring MD/NP/PA: Dr. Charm Barges  Patient coming from: Home  Chief Complaint  Patient presents with   Respiratory Distress   Shortness of Breath      HPI:    Michael [name]  is a 68 y.o. male, with PMH of HFrEF, orthostatic hypotension, recurrent syncope, CKD-3, CAD, COPD, BCC s/p thyroidectomy and maxillectomy in 2019 at [facility], right carotid stenosis, HTN and tobacco use, patient with multiple admissions in the past, with known to our service, due to acute on chronic systolic CHF, most recently discharged on 02/25/2023 -Patient presents to ED secondary to complaints of shortness of breath, reported progressive over last 3 days, reports mainly on exertion, 20/3 degree upon laying flat, but still using only 1 pillow, patient reports he is compliant with his diuretics, but when asked which when he is taking she could not tell me exactly, he states that he keeps changing it frequently, denies fever, chest pain, cough, hemoptysis, he does report worsening lower extremity edema. -In ED creatinine is up to 2, baseline 1.6, potassium at 3.4, BNP elevated at 4500, troponin 65> 74 hemoglobin stable at 12, negative respiratory panel, chest x-ray with cardiomegaly and vascular congestion, [facility] hospitalist consulted to admit for CHF.   Review of systems:      A full 10 point Review of Systems was done, except as stated above, all other Review of Systems were negative.   With Past History of the following :    Past Medical History:  Diagnosis Date   Aortic atherosclerosis (HCC) 08/03/2021   CAD (coronary artery disease) 08/03/2021   Cardiac catheterization September 2023 with RCA/RV marginal stenosis  managed medically   Cardiomyopathy (HCC)    Carotid artery disease (HCC)    CKD (chronic kidney disease) stage 3, GFR 30-59 ml/min (HCC)    COPD (chronic obstructive pulmonary disease) (HCC)    Essential hypertension    Head and neck cancer (HCC) 2019   Right facial basal cell carcinoma s/p resection with right partial mastectomy and partal rhinectomy with skin graft (06/13/17)   Iron deficiency anemia    Urinary retention       Past Surgical History:  Procedure Laterality Date   ANKLE CLOSED REDUCTION Right    open reduction   BASAL CELL CARCINOMA EXCISION  2019   at [facility]   BIOPSY  07/19/2021   Procedure: BIOPSY;  Surgeon: Lanelle Bal, DO;  [facility];  Service: Endoscopy;;   CATARACT EXTRACTION W/PHACO Left 01/23/2023   Procedure: CATARACT EXTRACTION PHACO AND INTRAOCULAR LENS PLACEMENT (IOC);  Surgeon: [name], MD;  Location: [facility];  Service: Ophthalmology;  Laterality: Left;  CDE: 23.26  COLONOSCOPY N/A 05/31/2017   Procedure: COLONOSCOPY;  Surgeon: Corbin Ade, MD;  [facility];  Service: Endoscopy;  Laterality: N/A;  2:45pm   COLONOSCOPY WITH PROPOFOL N/A 07/19/2021   Procedure: COLONOSCOPY WITH PROPOFOL;  Surgeon: Lanelle Bal, DO;  [facility];  Service: Endoscopy;  Laterality: N/A;  3:00pm, moved up to 9:00   CYSTOSCOPY N/A 10/29/2018   Procedure: CYSTOSCOPY, CLOT EVACUATION;  Surgeon: Rene Paci, MD;  Location: [facility];  Service: Urology;  Laterality: N/A;   PARTIAL COLECTOMY Right 08/11/2021   Procedure: PARTIAL COLECTOMY, OPEN RIGHT HEMICOLECTOMY;  Surgeon: Lucretia Roers, MD;  Location: [facility];  Service: General;  Laterality: Right;   POLYPECTOMY  05/31/2017   Procedure: POLYPECTOMY;  Surgeon: Corbin Ade, MD;  [facility];  Service: Endoscopy;;   RIGHT/LEFT HEART CATH AND CORONARY ANGIOGRAPHY N/A 10/22/2021   Procedure: RIGHT/LEFT HEART CATH AND CORONARY ANGIOGRAPHY;  Surgeon: Orbie Pyo, MD;  [facility];  Service: Cardiovascular;  Laterality: N/A;   TONSILLECTOMY     TRANSCAROTID ARTERY REVASCULARIZATION  Right 02/15/2021   Procedure: RIGHT TRANSCAROTID ARTERY REVASCULARIZATION;  Surgeon: Cephus Shelling, MD;  Location: [facility] OR;  Service: Vascular;  Laterality: Right;   ULTRASOUND GUIDANCE FOR VASCULAR ACCESS Left 02/15/2021   Procedure: ULTRASOUND GUIDANCE FOR VASCULAR ACCESS;  Surgeon: Cephus Shelling, MD;  Location: [facility] OR;  Service: Vascular;  Laterality: Left;   XI ROBOTIC ASSISTED SIMPLE PROSTATECTOMY N/A 10/29/2018   Procedure: XI ROBOTIC ASSISTED SIMPLE PROSTATECTOMY;  Surgeon: Malen Gauze, MD;  Location: [facility];  Service: Urology;  Laterality: N/A;      Social History:     Social History   Tobacco Use   Smoking status: Former    Current packs/day: 0.00    Average packs/day: 0.3 packs/day for 40.0 years (10.0 ttl pk-yrs)    Types: Cigarettes    Start date: 07/02/1981    Quit date: 07/02/2021    Years since quitting: 1.7    Passive exposure: Past   Smokeless tobacco: Never   Tobacco comments:    Quit on 06/09/21  Substance Use Topics   Alcohol use: Not Currently        Family History :     Family History  Problem Relation Age of Onset   Stroke Father    Cirrhosis Mother    Colon cancer Neg Hx       Home Medications:   Prior to Admission medications   Medication Sig Start Date End Date Taking? Authorizing Provider  apixaban (ELIQUIS) 5 MG TABS tablet Take 1 tablet (5 mg total) by mouth 2 (two) times daily. 02/05/23 04/06/23 Yes Loetta Rough, MD  atorvastatin (LIPITOR) 40 MG tablet Take 1 tablet (40 mg total) by mouth every evening. Patient taking differently: Take 40 mg by mouth daily. 08/01/22 02/05/24 Yes Shahmehdi, Gemma Payor, MD  Budeson-Glycopyrrol-Formoterol (BREZTRI AEROSPHERE) 160-9-4.8 MCG/ACT AERO Inhale 2 puffs into the lungs 2 (two) times daily.   Yes [provider]  carvedilol (COREG) 12.5 MG  tablet Take 1 tablet (12.5 mg total) by mouth 2 (two) times daily with a meal. 11/25/22  Yes Vassie Loll, MD  feeding supplement (ENSURE ENLIVE / ENSURE PLUS) LIQD Take 237 mLs by mouth 3 (three) times daily between meals. Patient taking differently: Take 237 mLs by mouth daily. 11/25/22  Yes Vassie Loll, MD  ILEVRO 0.3 % ophthalmic suspension Place 1 drop into both eyes in the morning  and at bedtime. 12/12/22  Yes [provider]  losartan (COZAAR) 25 MG tablet Take 25 mg by mouth daily. 12/23/22  Yes [provider]  nitroGLYCERIN (NITROSTAT) 0.4 MG SL tablet Place 1 tablet (0.4 mg total) under the tongue every 5 (five) minutes as needed for chest pain. 02/05/23  Yes Loetta Rough, MD  pantoprazole (PROTONIX) 40 MG tablet Take 1 tablet (40 mg total) by mouth daily. 11/25/22 11/25/23 Yes Vassie Loll, MD  potassium chloride SA (KLOR-CON M) 20 MEQ tablet Take 1 tablet (20 mEq total) by mouth daily. 07/26/22 02/05/24 Yes Shahmehdi, Gemma Payor, MD  REPATHA SURECLICK 140 MG/ML SOAJ Inject 1 mL into the skin every 14 (fourteen) days. 10/19/22  Yes [provider]  torsemide (DEMADEX) 20 MG tablet Take 1 tablet (20 mg total) by mouth 2 (two) times daily. 03/10/23  Yes Strader, Lennart Pall, PA-C     Allergies:    No Known Allergies   Physical Exam:   Vitals  Blood pressure (!) 159/108, pulse 95, temperature 98.1 F (36.7 C), temperature source Oral, resp. rate (!) 24, height 6' (1.829 m), weight 64.2 kg, SpO2 93%.   1. General Frail, chronic ill-appearing male, appears older than stated age, laying in bed mildly dyspneic,  2. Normal affect and insight, Not Suicidal or Homicidal, Awake Alert, Oriented X 3.  3. No F.N deficits, ALL C.Nerves Intact, Strength 5/5 all 4 extremities, Sensation intact all 4 extremities, Plantars down going.  4.  Stage patient status post surgical facial changes status post rhinectomy   5. Supple Neck, positive JVD, No cervical  lymphadenopathy appriciated,  6. Symmetrical Chest wall movement, tachypneic, no wheezing, crackles at the bases  7. RRR, No Gallops, Rubs or Murmurs, No Parasternal Heave.  +1 edema  8. Positive Bowel Sounds, Abdomen Soft, No tenderness, No organomegaly appriciated,No rebound -guarding or rigidity.  9.  No Cyanosis, Normal Skin Turgor, No Skin Rash or Bruise.  10. Good muscle tone,  joints appear normal , no effusions, Normal ROM.     Data Review:    CBC Recent Labs  Lab 03/31/23 1758  WBC 7.6  HGB 12.0*  HCT 38.8*  PLT 282  MCV 90.0  MCH 27.8  MCHC 30.9  RDW 15.3  LYMPHSABS 1.1  MONOABS 0.5  EOSABS 0.3  BASOSABS 0.1   ------------------------------------------------------------------------------------------------------------------  Chemistries  Recent Labs  Lab 03/31/23 1758  NA 140  K 3.4*  CL 104  CO2 24  GLUCOSE 109*  BUN 27*  CREATININE 2.05*  CALCIUM 9.1   ------------------------------------------------------------------------------------------------------------------ estimated creatinine clearance is 31.3 mL/min (A) (by C-G formula based on SCr of 2.05 mg/dL (H)). ------------------------------------------------------------------------------------------------------------------ No results for input(s): "TSH", "T4TOTAL", "T3FREE", "THYROIDAB" in the last 72 hours.  Invalid input(s): "FREET3"  Coagulation profile No results for input(s): "INR", "PROTIME" in the last 168 hours. ------------------------------------------------------------------------------------------------------------------- No results for input(s): "DDIMER" in the last 72 hours. -------------------------------------------------------------------------------------------------------------------  Cardiac Enzymes No results for input(s): "CKMB", "TROPONINI", "MYOGLOBIN" in the last 168 hours.  Invalid input(s):  "CK" ------------------------------------------------------------------------------------------------------------------    Component Value Date/Time   BNP >4,500.0 (H) 03/31/2023 1758     ---------------------------------------------------------------------------------------------------------------  Urinalysis    Component Value Date/Time   COLORURINE YELLOW 11/21/2022 2100   APPEARANCEUR CLEAR 11/21/2022 2100   APPEARANCEUR Cloudy (A) 06/16/2021 0936   LABSPEC 1.017 11/21/2022 2100   LABSPEC 1.018 04/23/2011 1044   PHURINE 5.0 11/21/2022 2100   GLUCOSEU NEGATIVE 11/21/2022 2100   GLUCOSEU Negative 04/23/2011 1044   HGBUR NEGATIVE 11/21/2022  2100   BILIRUBINUR NEGATIVE 11/21/2022 2100   BILIRUBINUR Negative 06/16/2021 0936   BILIRUBINUR Negative 04/23/2011 1044   KETONESUR NEGATIVE 11/21/2022 2100   PROTEINUR 100 (A) 11/21/2022 2100   NITRITE NEGATIVE 11/21/2022 2100   LEUKOCYTESUR NEGATIVE 11/21/2022 2100   LEUKOCYTESUR Trace 04/23/2011 1044    ----------------------------------------------------------------------------------------------------------------   Imaging Results:    DG Chest Port 1 View Result Date: 03/31/2023 CLINICAL DATA:  Shortness of breath EXAM: PORTABLE CHEST 1 VIEW COMPARISON:  02/21/2023 FINDINGS: Cardiomegaly with mild central congestion. No focal airspace disease, pleural effusion or pneumothorax. Aortic atherosclerosis. IMPRESSION: Cardiomegaly with mild central congestion Electronically Signed   By: Jasmine Pang M.D.   On: 03/31/2023 18:27    EKG:  Vent. rate 80 BPM PR interval 279 ms QRS duration 131 ms QT/QTcB 421/486 ms P-R-T axes 241 63 239 Sinus or ectopic atrial rhythm Ventricular premature complex Prolonged PR interval LVH with IVCD and secondary repol abnrm Anterior ST elevation, probably due to LVH Borderline prolonged QT interval   Assessment & Plan:    Principal Problem:   Acute on chronic combined systolic and diastolic CHF  (congestive heart failure) (HCC) Active Problems:   Acute kidney injury superimposed on CKD (HCC)   CAD (coronary artery disease)   COPD (chronic obstructive pulmonary disease) (HCC)   Atrial fibrillation with rapid ventricular response (HCC)   Acute on chronic HFrEF -Patient presents with dyspnea, orthopnea and edema -Patient with multiple admissions in the past due to medication noncompliance, fluid and salt restriction noncompliance -Significant evidence of volume overload on imaging, significantly elevated BNP, will start on IV Lasix 40 mg IV twice daily. -Renal function closely -Continue with daily weights, strict ins and out -Continue with Coreg for now, will hold losartan in the setting of worsening renal function  Chronic COPD, emphysema: Stable. -No wheezing, no indication for steroids -Continue home Breztri, will keep on as needed albuterol   AKI on CKD stage IIIb  -Worsening renal function, concern for cardiorenal syndrome, monitor renal function closely as on diuresis . -Hold nephrotoxic medications  -Will hold losartan     Elevated troponin/type II MI/demand ischemia:  -Secondary to demand ischemia from CHF, as well due to worsening renal failure  -does not ACS pattern 65> 74  History of AFib with RVR: -Rate controlled, continue with Coreg for heart rate control, continue with Eliquis for anticoagulation  Right carotid stenosis -With vascular surgery Dr. Jettie Booze, candidate for stent or endarterectomy -Continue with medical management, he is on Eliquis statin and Repatha  Basal cell carcinoma of nasolabial fold 06/06/2017 -right partial rhinectomy, resection of left intranasal lesion, skin graft @[facility]. Margins were clear on pathology.   Hypokalemia:  -repelced, monitor closely especially he is on IV diuresis  Tobacco abuse -Patient reports he gets drastically on his smoking, down to 2 cigarettes a day, he was congratulated about that, for now he does not want  nicotine patch as he does not think it will help.  DVT Prophylaxis on Eliquis  AM Labs Ordered, also please review Full Orders  Family Communication: Admission, patients condition and plan of care including tests being ordered have been discussed with the patient who indicate understanding and agree with the plan and Code Status.  Code Status full code  Likely DC to home  Consults called: None  Admission status: Inpatient  Time spent in minutes : 70 minutes   Michael [name] M.D on 03/31/2023 at 9:11 PM   [facility] - Office  [phone]

## 2023-03-31 NOTE — ED Notes (Signed)
 Hosptalist gave patient sandwich and apple sauce

## 2023-03-31 NOTE — Patient Outreach (Signed)
 Care Coordination   03/31/2023 Name: Michael [name] MRN: [id number] DOB: 1955/04/04   Care Coordination Outreach Attempts:  An unsuccessful telephone outreach was attempted today to offer the patient information about available complex care management services.  Follow Up Plan:  No further outreach attempts will be made at this time. We have been unable to contact the patient to offer or enroll patient in complex care management services.  Encounter Outcome:  No Answer   attempt on 03/30/23 to reach care giver [name] She answered but quickly informed RN CM she was busy. Patient clarified to be with the counnty hospice/palliative services This case willl be closed 03/31/23 and restarted if reassigned  Care Coordination Interventions:  No, not indicated     Interventions Today    Flowsheet Row Most Recent Value  Chronic Disease   Chronic disease during today's visit Other, Congestive Heart Failure (CHF), Chronic Kidney Disease/End Stage Renal Disease (ESRD)  [atempt on 03/30/23 to reach care giver [name] She answered but quickly informed RN CM she was busy. Patient clarified to be with the counnty hospice/palliative services This case willl be closed and restarted if reassigned]        [name], RN, BSN, CCM [place]  [facility], [facility] RN Care Manager Direct Dial: [phone]  Fax: [phone] Mailing Address: [address]  [place] [address] Website: [email or web address]

## 2023-03-31 NOTE — ED Triage Notes (Signed)
 Pt brought in by [facility] from home with c/o SOB over the past few weeks, worsening since last night. When fire dept arrived, pt's O2 sat was 88% and they placed pt on a NRB. EMS arrived and they continued the NRB. A few minutes before arriving to the ED they removed the NRB due to it not improving his breathing. Pt's O2 sat remained 95% RA. Hx of CHF and COPD.

## 2023-03-31 NOTE — ED Notes (Signed)
 Gave water and blanket

## 2023-03-31 NOTE — Patient Instructions (Signed)
 Visit Information  Thank you for taking time to visit with me today. Please don't hesitate to contact me if I can be of assistance to you.   Following are the goals we discussed today:   Goals Addressed             This Visit's Progress    COMPLETED: Manage post cataract, CHF, COPD & hypertenson home care  -nurse care coordination services   Not on track    Care Coordination Goals: not met case closure with county hospices services  Patient will continue Home Health services with [facility] Patient/family will work with palliative care. Order placed while inpatient.  Patient will work with [facility]/ Care Management Dept regarding LTC placement once discharged Patient will continue to work with care coordination SW as needed Patient will begin to receive medicines via pill packaging from [place]- confirmed now have pill packaging [employer] 01/23/23-  Interventions Today    Flowsheet Row Most Recent Value  Chronic Disease   Chronic disease during today's visit Other, Congestive Heart Failure (CHF), Chronic Kidney Disease/End Stage Renal Disease (ESRD)  [atempt on 03/30/23 to reach care giver [name] She answered but quickly informed RN CM she was busy. Patient clarified to be with the counnty hospice/palliative services This case willl be closed and restarted if reassigned]              Our next appointment is  n/a  on n/s at n/s  Please call the care guide team at [phone] if you need to cancel or reschedule your appointment.   If you are experiencing a Mental Health or Behavioral Health Crisis or need someone to talk to, please call the Suicide and Crisis Lifeline: 988 call the [place] National Suicide Prevention Lifeline: (361)646-4116 or TTY: 908 407 7243 TTY 623-322-1941) to talk to a trained counselor call 1-800-273-TALK (toll free, 24 hour hotline) call the [facility]: 941-650-9538 call 911   This patient and care giver  has access to RN CM office number - attempt on 03/30/23 to reach care giver [name] She answered but quickly informed RN CM she was busy. Patient clarified to be with the counnty hospice/palliative services This case willl be closed 03/31/23 and restarted if reassigned   The patient has been provided with contact information for the care management team and has been advised to call with any health related questions or concerns.   Aaryav Hopfensperger L. Noelle Penner, RN, BSN, CCM [place]  [facility], [facility] RN Care Manager Direct Dial: [phone]  Fax: [phone] Mailing Address: [address]  [place] [address] Website: [email or web address]

## 2023-03-31 NOTE — ED Notes (Addendum)
 Ambulated patient in room. Saturations were 92-95% on room air. Denies any increased SOB however RR did increase to 42

## 2023-03-31 NOTE — ED Provider Notes (Signed)
 [place] EMERGENCY DEPARTMENT AT [facility] Provider Note   CSN: [id number] Arrival date & time: 03/31/23  1713     History  No chief complaint on file.   Michael [name] is a 68 y.o. male.  He has a history of heart failure COPD CKD tobacco use.  He called EMS for shortness of breath.  York Spaniel he has been short of breath for a few weeks on and off and it was worse today.  No significant cough no fever.  No current chest pain.  No significant leg swelling.  He said he has been compliant with his medications.  He was admitted back in January for CHF exacerbation.  Last saw cardiology about 3 weeks ago.  Is on Eliquis.  The history is provided by the patient and the EMS personnel.  Shortness of Breath Severity:  Moderate Onset quality:  Gradual Duration:  2 weeks Timing:  Intermittent Progression:  Worsening Chronicity:  Recurrent Relieved by:  Nothing Worsened by:  Activity Ineffective treatments:  Rest and diuretics Associated symptoms: no abdominal pain, no chest pain, no cough, no fever, no hemoptysis and no sputum production   Risk factors: tobacco use        Home Medications Prior to Admission medications   Medication Sig Start Date End Date Taking? Authorizing Provider  albuterol (VENTOLIN HFA) 108 (90 Base) MCG/ACT inhaler Inhale 2 puffs into the lungs every 6 (six) hours as needed for wheezing or shortness of breath. 04/27/22   Shahmehdi, Gemma Payor, MD  apixaban (ELIQUIS) 5 MG TABS tablet Take 1 tablet (5 mg total) by mouth 2 (two) times daily. 02/05/23 04/06/23  Loetta Rough, MD  aspirin EC 81 MG tablet Take 1 tablet (81 mg total) by mouth daily. Swallow whole. 11/26/22   Vassie Loll, MD  atorvastatin (LIPITOR) 40 MG tablet Take 1 tablet (40 mg total) by mouth every evening. Patient taking differently: Take 40 mg by mouth daily. 08/01/22 02/05/24  ShahmehdiGemma Payor, MD  Budeson-Glycopyrrol-Formoterol (BREZTRI AEROSPHERE) 160-9-4.8 MCG/ACT AERO Inhale 2 puffs  into the lungs 2 (two) times daily.    [provider]  carvedilol (COREG) 12.5 MG tablet Take 1 tablet (12.5 mg total) by mouth 2 (two) times daily with a meal. 11/25/22   Vassie Loll, MD  feeding supplement (ENSURE ENLIVE / ENSURE PLUS) LIQD Take 237 mLs by mouth 3 (three) times daily between meals. Patient taking differently: Take 237 mLs by mouth daily. 11/25/22   Vassie Loll, MD  ILEVRO 0.3 % ophthalmic suspension Place 1 drop into both eyes in the morning and at bedtime. 12/12/22   [provider]  losartan (COZAAR) 25 MG tablet Take 25 mg by mouth daily. 12/23/22   [provider]  nitroGLYCERIN (NITROSTAT) 0.4 MG SL tablet Place 1 tablet (0.4 mg total) under the tongue every 5 (five) minutes as needed for chest pain. 02/05/23   Loetta Rough, MD  pantoprazole (PROTONIX) 40 MG tablet Take 1 tablet (40 mg total) by mouth daily. 11/25/22 11/25/23  Vassie Loll, MD  potassium chloride SA (KLOR-CON M) 20 MEQ tablet Take 1 tablet (20 mEq total) by mouth daily. 07/26/22 02/05/24  Shahmehdi, Gemma Payor, MD  REPATHA SURECLICK 140 MG/ML SOAJ Inject 1 mL into the skin every 14 (fourteen) days. 10/19/22   [provider]  torsemide (DEMADEX) 20 MG tablet Take 1 tablet (20 mg total) by mouth 2 (two) times daily. 03/10/23   Ellsworth Lennox, PA-C  Allergies    Patient has no known allergies.    Review of Systems   Review of Systems  Constitutional:  Negative for fever.  Respiratory:  Positive for shortness of breath. Negative for cough, hemoptysis and sputum production.   Cardiovascular:  Negative for chest pain.  Gastrointestinal:  Negative for abdominal pain.    Physical Exam Updated Vital Signs BP 127/72 (BP Location: Right Arm)   Pulse 67   Temp 98 F (36.7 C) (Oral)   Resp 20   Ht 6' (1.829 m)   Wt 62.4 kg   SpO2 99%   BMI 18.66 kg/m  Physical Exam Vitals and nursing note reviewed.  Constitutional:      General: He is not in acute  distress.    Appearance: He is well-developed.  HENT:     Head: Normocephalic and atraumatic.     Nose:     Comments: He has significant nasal resection remote Eyes:     Conjunctiva/sclera: Conjunctivae normal.  Cardiovascular:     Rate and Rhythm: Regular rhythm. Tachycardia present.     Heart sounds: No murmur heard. Pulmonary:     Effort: Tachypnea and accessory muscle usage present. No respiratory distress.     Breath sounds: Normal breath sounds.  Abdominal:     Palpations: Abdomen is soft.     Tenderness: There is no abdominal tenderness. There is no guarding or rebound.  Musculoskeletal:        General: No swelling.     Cervical back: Neck supple.     Right lower leg: Edema present.     Left lower leg: Edema present.  Skin:    General: Skin is warm and dry.     Capillary Refill: Capillary refill takes less than 2 seconds.  Neurological:     General: No focal deficit present.     Mental Status: He is alert.     ED Results / Procedures / Treatments   Labs (all labs ordered are listed, but only abnormal results are displayed) Labs Reviewed  BASIC METABOLIC PANEL - Abnormal; Notable for the following components:      Result Value   Potassium 3.4 (*)    Glucose, Bld 109 (*)    BUN 27 (*)    Creatinine, Ser 2.05 (*)    GFR, Estimated 35 (*)    All other components within normal limits  BRAIN NATRIURETIC PEPTIDE - Abnormal; Notable for the following components:   B Natriuretic Peptide >4,500.0 (*)    All other components within normal limits  CBC WITH DIFFERENTIAL/PLATELET - Abnormal; Notable for the following components:   Hemoglobin 12.0 (*)    HCT 38.8 (*)    All other components within normal limits  BASIC METABOLIC PANEL - Abnormal; Notable for the following components:   Potassium 3.4 (*)    BUN 27 (*)    Creatinine, Ser 1.79 (*)    GFR, Estimated 41 (*)    All other components within normal limits  CBC - Abnormal; Notable for the following components:    RBC 4.15 (*)    Hemoglobin 11.4 (*)    HCT 36.7 (*)    All other components within normal limits  TROPONIN I (HIGH SENSITIVITY) - Abnormal; Notable for the following components:   Troponin I (High Sensitivity) 65 (*)    All other components within normal limits  TROPONIN I (HIGH SENSITIVITY) - Abnormal; Notable for the following components:   Troponin I (High Sensitivity) 74 (*)  All other components within normal limits  RESP PANEL BY RT-PCR (RSV, FLU A&B, COVID)  [id number]    EKG EKG Interpretation Date/Time:  Friday March 31 2023 18:32:10 EST Ventricular Rate:  80 PR Interval:  279 QRS Duration:  131 QT Interval:  421 QTC Calculation: 486 R Axis:   63  Text Interpretation: Sinus or ectopic atrial rhythm Ventricular premature complex Prolonged PR interval LVH with IVCD and secondary repol abnrm Anterior ST elevation, probably due to LVH Borderline prolonged QT interval Confirmed by Meridee Score [id number]) on 03/31/2023 6:35:01 PM  Radiology DG Chest Port 1 View Result Date: 03/31/2023 CLINICAL DATA:  Shortness of breath EXAM: PORTABLE CHEST 1 VIEW COMPARISON:  02/21/2023 FINDINGS: Cardiomegaly with mild central congestion. No focal airspace disease, pleural effusion or pneumothorax. Aortic atherosclerosis. IMPRESSION: Cardiomegaly with mild central congestion Electronically Signed   By: Jasmine Pang M.D.   On: 03/31/2023 18:27    Procedures Procedures    Medications Ordered in ED Medications  atorvastatin (LIPITOR) tablet 40 mg (40 mg Oral Given 04/01/23 0824)  carvedilol (COREG) tablet 12.5 mg (12.5 mg Oral Given 04/01/23 0824)  pantoprazole (PROTONIX) EC tablet 40 mg (40 mg Oral Given 04/01/23 0827)  apixaban (ELIQUIS) tablet 5 mg (5 mg Oral Given 04/01/23 0824)  acetaminophen (TYLENOL) tablet 650 mg (has no administration in time range)    Or  acetaminophen (TYLENOL) suppository 650 mg (has no administration in time range)  albuterol (PROVENTIL) (2.5 MG/3ML) 0.083% nebulizer  solution 2.5 mg (has no administration in time range)  furosemide (LASIX) injection 40 mg (40 mg Intravenous Given 04/01/23 0827)  hydrALAZINE (APRESOLINE) injection 10 mg (10 mg Intravenous Given 03/31/23 2243)  umeclidinium bromide (INCRUSE ELLIPTA) 62.5 MCG/ACT 1 puff (1 puff Inhalation Not Given 04/01/23 0801)    And  fluticasone furoate-vilanterol (BREO ELLIPTA) 200-25 MCG/ACT 1 puff (1 puff Inhalation Not Given 04/01/23 0801)  furosemide (LASIX) injection 40 mg (40 mg Intravenous Given 03/31/23 1943)  potassium chloride SA (KLOR-CON M) CR tablet 40 mEq (40 mEq Oral Given 04/01/23 0341)    ED Course/ Medical Decision Making/ A&P Clinical Course as of 04/01/23 1036  Fri Mar 31, 2023  1818 chest x-ray interpreted by me as significant chronic changes no clear new infiltrate.  Awaiting radiology reading. [MB]  2043 Patient diuresed a little bit.  The nurse ambulated him and he did not desat below 92% but his respirations went up to 40. [MB]  2108 Discussed with [facility] hospitalist Dr. Randol Kern who will evaluate patient for admission [MB]    Clinical Course User Index [MB] Terrilee Files, MD                                 Medical Decision Making Amount and/or Complexity of Data Reviewed Labs: ordered. Radiology: ordered.  Risk Prescription drug management. Decision regarding hospitalization.   This patient complains of shortness of breath; this involves an extensive number of treatment Options and is a complaint that carries with it a high risk of complications and morbidity. The differential includes COPD, CHF, pneumonia, COVID, flu, pneumothorax, ACS  I ordered, reviewed and interpreted labs, which included CBC stable from priors, chemistries with elevated creatinine, troponins elevated need to be trended, BNP elevated, COVID and flu negative I ordered medication IV Lasix and reviewed PMP when indicated. I ordered imaging studies which included chest x-ray and I independently     visualized and interpreted imaging which showed  interstitial edema Additional history obtained from EMS Previous records obtained and reviewed in epic including recent discharge summary I consulted [facility] hospitalist Dr. Randol Kern and discussed lab and imaging findings and discussed disposition.  Cardiac monitoring reviewed, sinus rhythm Social determinants considered, housing insecurity social isolation Critical Interventions: None  After the interventions stated above, I reevaluated the patient and found patient not to be hypoxic but still with increased work of breathing Admission and further testing considered, patient would benefit from mission the hospital for IV diuresis and further management.  He is in agreement with plan for admission.         Final Clinical Impression(s) / ED Diagnoses Final diagnoses:  Acute on chronic congestive heart failure, unspecified heart failure type [facility])    Rx / DC Orders ED Discharge Orders     None         Terrilee Files, MD 04/01/23 1040

## 2023-04-01 DIAGNOSIS — J449 Chronic obstructive pulmonary disease, unspecified: Secondary | ICD-10-CM | POA: Diagnosis not present

## 2023-04-01 DIAGNOSIS — N179 Acute kidney failure, unspecified: Secondary | ICD-10-CM

## 2023-04-01 DIAGNOSIS — I5043 Acute on chronic combined systolic (congestive) and diastolic (congestive) heart failure: Secondary | ICD-10-CM | POA: Diagnosis not present

## 2023-04-01 DIAGNOSIS — N189 Chronic kidney disease, unspecified: Secondary | ICD-10-CM

## 2023-04-01 LAB — BASIC METABOLIC PANEL
Anion gap: 11 (ref 5–15)
BUN: 27 mg/dL — ABNORMAL HIGH (ref 8–23)
CO2: 23 mmol/L (ref 22–32)
Calcium: 9.1 mg/dL (ref 8.9–10.3)
Chloride: 107 mmol/L (ref 98–111)
Creatinine, Ser: 1.79 mg/dL — ABNORMAL HIGH (ref 0.61–1.24)
GFR, Estimated: 41 mL/min — ABNORMAL LOW (ref >60)
Glucose, Bld: 97 mg/dL (ref 70–99)
Potassium: 3.4 mmol/L — ABNORMAL LOW (ref 3.5–5.1)
Sodium: 141 mmol/L (ref 135–145)

## 2023-04-01 LAB — CBC
HCT: 36.7 % — ABNORMAL LOW (ref 39.0–52.0)
Hemoglobin: 11.4 g/dL — ABNORMAL LOW (ref 13.0–17.0)
MCH: 27.5 pg (ref 26.0–34.0)
MCHC: 31.1 g/dL (ref 30.0–36.0)
MCV: 88.4 fL (ref 80.0–100.0)
Platelets: 274 10*3/uL (ref 150–400)
RBC: 4.15 MIL/uL — ABNORMAL LOW (ref 4.22–5.81)
RDW: 15.4 % (ref 11.5–15.5)
WBC: 7.9 10*3/uL (ref 4.0–10.5)
nRBC: 0 % (ref 0.0–0.2)

## 2023-04-01 NOTE — Progress Notes (Addendum)
 PROGRESS NOTE   Michael [name]  GNF:[id number] DOB: 1955-08-21 DOA: 03/31/2023 PCP: Benita Stabile, MD   Chief Complaint  Patient presents with   Respiratory Distress   Shortness of Breath   Level of care: Telemetry  Brief Admission History:  68 y.o. male, with PMH of HFrEF, orthostatic hypotension, recurrent syncope, CKD-3, CAD, COPD, BCC s/p thyroidectomy and maxillectomy in 2019 at [facility], right carotid stenosis, HTN and tobacco use, patient with multiple admissions in the past, well known to our service, due to acute on chronic systolic CHF, most recently discharged on 02/25/2023 -Patient presented to ED secondary to complaints of shortness of breath, reported progressive over last 3 days, reports mainly on exertion, severe dyspnea upon laying flat, but still using only 1 pillow, patient reports he is compliant with his diuretics, but when asked which when he is taking she could not tell me exactly, he states that he keeps changing it frequently, denies fever, chest pain, cough, hemoptysis, he does report worsening lower extremity edema. -In ED creatinine is up to 2, baseline 1.6, potassium at 3.4, BNP elevated at 4500, troponin 65> 74 hemoglobin stable at 12, negative respiratory panel, chest x-ray with cardiomegaly and vascular congestion, [facility] hospitalist consulted to admit for CHF.   Assessment and Plan:  Acute on chronic HFrEF -Patient presented with dyspnea, orthopnea and edema -Patient with multiple admissions in the past due to medication noncompliance, fluid and salt restriction noncompliance -Significant evidence of volume overload on imaging, significantly elevated BNP, continue IV Lasix 40 mg IV twice daily. -Renal function closely -Continue with daily weights, strict ins and out -Continue with Coreg for now, will hold losartan in the setting of worsening renal function -ReDs vest reading 46 today, follow daily.   Chronic COPD, emphysema: Stable. -No wheezing, no  indication for steroids -Continue home Breztri, will keep on as needed albuterol   AKI on CKD stage IIIb  -Worsening renal function, concern for cardiorenal syndrome, monitor renal function closely as on diuresis. -Hold nephrotoxic medications  -holding losartan temporarily    Elevated troponin/type II MI/demand ischemia:  -Secondary to demand ischemia from CHF, as well due to worsening renal failure  -does not ACS pattern 65> 74   History of AFib with RVR: -Rate controlled, continue with Coreg for heart rate control, continue with apixaban for anticoagulation   Right carotid stenosis -With vascular surgery Dr. Jettie Booze, candidate for stent or endarterectomy -Continue with medical management, he is on Eliquis statin and Repatha   Basal cell carcinoma of nasolabial fold 06/06/2017 -right partial rhinectomy, resection of left intranasal lesion, skin graft @[facility]. Margins were clear on pathology.   Hypokalemia:  -repleted, recheck in AM  - check Mg    Tobacco abuse -Patient reports he gets drastically on his smoking, down to 2 cigarettes a day -declines nicotine patch   DVT prophylaxis: apixaban Code Status: full  Family Communication:  Disposition: home    Consultants:   Procedures:   Antimicrobials:    Subjective: Pt reports he has been diuresing most of the night.  He remains very SOB.   Objective: Vitals:   04/01/23 0449 04/01/23 0824 04/01/23 1005 04/01/23 1227  BP:  (!) 145/97 127/72 114/73  Pulse:  77 67 62  Resp:    16  Temp:  98 F (36.7 C) 98 F (36.7 C) 97.8 F (36.6 C)  TempSrc:   Oral Oral  SpO2:  94% 99% 100%  Weight: 62.4 kg     Height:  Intake/Output Summary (Last 24 hours) at 04/01/2023 1234 Last data filed at 04/01/2023 1145 Gross per 24 hour  Intake 360 ml  Output 2050 ml  Net -1690 ml   Filed Weights   03/31/23 1756 04/01/23 0449  Weight: 64.2 kg 62.4 kg   Examination:  General exam: Appears calm and comfortable  Respiratory  system: Clear to auscultation. Respiratory effort normal. Cardiovascular system: normal S1 & S2 heard. Mild JVD, s3 heard, no rubs, gallops or clicks. 2+ pedal edema. Gastrointestinal system: Abdomen is nondistended, soft and nontender. No organomegaly or masses felt. Normal bowel sounds heard. Central nervous system: Alert and oriented. No focal neurological deficits. Extremities: 2+ edema BLEsSymmetric 5 x 5 power. Skin: No rashes, lesions or ulcers. Psychiatry: Judgement and insight appear normal. Mood & affect appropriate.   Data Reviewed: I have personally reviewed following labs and imaging studies  CBC: Recent Labs  Lab 03/31/23 1758 04/01/23 0522  WBC 7.6 7.9  NEUTROABS 5.6  --   HGB 12.0* 11.4*  HCT 38.8* 36.7*  MCV 90.0 88.4  PLT 282 274    Basic Metabolic Panel: Recent Labs  Lab 03/31/23 1758 04/01/23 0522  NA 140 141  K 3.4* 3.4*  CL 104 107  CO2 24 23  GLUCOSE 109* 97  BUN 27* 27*  CREATININE 2.05* 1.79*  CALCIUM 9.1 9.1    CBG: No results for input(s): "GLUCAP" in the last 168 hours.  Recent Results (from the past 240 hours)  Resp panel by RT-PCR (RSV, Flu A&B, Covid) Anterior Nasal Swab     Status: None   Collection Time: 03/31/23  6:01 PM   Specimen: Anterior Nasal Swab  Result Value Ref Range Status   SARS Coronavirus 2 by RT PCR NEGATIVE NEGATIVE Final    Comment: (NOTE) SARS-CoV-2 target nucleic acids are NOT DETECTED.  The SARS-CoV-2 RNA is generally detectable in upper respiratory specimens during the acute phase of infection. The lowest concentration of SARS-CoV-2 viral copies this assay can detect is 138 copies/mL. A negative result does not preclude SARS-Cov-2 infection and should not be used as the sole basis for treatment or other patient management decisions. A negative result may occur with  improper specimen collection/handling, submission of specimen other than nasopharyngeal swab, presence of viral mutation(s) within the areas  targeted by this assay, and inadequate number of viral copies(<138 copies/mL). A negative result must be combined with clinical observations, patient history, and epidemiological information. The expected result is Negative.  Fact Sheet for Patients:  BloggerCourse.com  Fact Sheet for Healthcare Providers:  SeriousBroker.it  This test is no t yet approved or cleared by the [place] FDA and  has been authorized for detection and/or diagnosis of SARS-CoV-2 by FDA under an Emergency Use Authorization (EUA). This EUA will remain  in effect (meaning this test can be used) for the duration of the COVID-19 declaration under Section 564(b)(1) of the Act, 21 U.S.C.section 360bbb-3(b)(1), unless the authorization is terminated  or revoked sooner.       Influenza A by PCR NEGATIVE NEGATIVE Final   Influenza B by PCR NEGATIVE NEGATIVE Final    Comment: (NOTE) The Xpert Xpress SARS-CoV-2/FLU/RSV plus assay is intended as an aid in the diagnosis of influenza from Nasopharyngeal swab specimens and should not be used as a sole basis for treatment. Nasal washings and aspirates are unacceptable for Xpert Xpress SARS-CoV-2/FLU/RSV testing.  Fact Sheet for Patients: BloggerCourse.com  Fact Sheet for Healthcare Providers: SeriousBroker.it  This test is not yet approved  or cleared by the [place] and has been authorized for detection and/or diagnosis of SARS-CoV-2 by FDA under an Emergency Use Authorization (EUA). This EUA will remain in effect (meaning this test can be used) for the duration of the COVID-19 declaration under Section 564(b)(1) of the Act, 21 U.S.C. section 360bbb-3(b)(1), unless the authorization is terminated or revoked.     Resp Syncytial Virus by PCR NEGATIVE NEGATIVE Final    Comment: (NOTE) Fact Sheet for  Patients: BloggerCourse.com  Fact Sheet for Healthcare Providers: SeriousBroker.it  This test is not yet approved or cleared by the [place] FDA and has been authorized for detection and/or diagnosis of SARS-CoV-2 by FDA under an Emergency Use Authorization (EUA). This EUA will remain in effect (meaning this test can be used) for the duration of the COVID-19 declaration under Section 564(b)(1) of the Act, 21 U.S.C. section 360bbb-3(b)(1), unless the authorization is terminated or revoked.  Performed at [facility], [address] [place], [place] [address]      Radiology Studies: [facility] Chest [facility] 1 View Result Date: 03/31/2023 CLINICAL DATA:  Shortness of breath EXAM: PORTABLE CHEST 1 VIEW COMPARISON:  02/21/2023 FINDINGS: Cardiomegaly with mild central congestion. No focal airspace disease, pleural effusion or pneumothorax. Aortic atherosclerosis. IMPRESSION: Cardiomegaly with mild central congestion Electronically Signed   By: Jasmine Pang M.D.   On: 03/31/2023 18:27    Scheduled Meds:  apixaban  5 mg Oral BID   atorvastatin  40 mg Oral Daily   carvedilol  12.5 mg Oral BID WC   umeclidinium bromide  1 puff Inhalation Daily   And   fluticasone furoate-vilanterol  1 puff Inhalation Daily   furosemide  40 mg Intravenous BID   pantoprazole  40 mg Oral Daily   Continuous Infusions:   LOS: 1 day   Time spent: 55 mins  Namya Voges Laural Benes, MD How to contact the [facility] Attending or Consulting provider 7A - 7P or covering provider during after hours 7P -7A, for this patient?  Check the care team in [facility] and look for a) attending/consulting [facility] provider listed and b) the [facility] team listed Log into [email or web address] to find provider on call.  Locate the [facility] provider you are looking for under [facility] and page to a number that you can be directly reached. If you still have difficulty reaching the provider, please page the [facility]  (Director on Call) for the Hospitalists listed on amion for assistance.  04/01/2023, 12:34 PM

## 2023-04-01 NOTE — Progress Notes (Signed)
   04/01/23 0800  ReDS Vest / Clip  Station Marker D  Ruler Value 32  ReDS Value Range (!) > 40  ReDS Actual Value 46

## 2023-04-01 NOTE — Hospital Course (Signed)
 68 y.o. male, with PMH of HFrEF, orthostatic hypotension, recurrent syncope, CKD-3, CAD, COPD, basal cell carcinoma status post partial rhinectomy and maxillectomy 06/06/2017 at [facility], advanced colonic tubular adenoma, right carotid stenosis, hypertension, hyperlipidemia presents with sob. patient with multiple admissions in the past, well known to our service, due to acute on chronic systolic CHF, most recently 02/21/23 to 02/25/23.  D/C weight 135 at that time.  D/C home with lasix 40 mg bid.  Pt endorses dietary and fluid indiscretion.  This is his seventh admission in past 12 months -Patient presented to ED secondary to complaints of shortness of breath, reported progressive over last 3 days, reports mainly on exertion, severe dyspnea upon laying flat, but still using only 1 pillow, patient reports he is compliant with his diuretics, but when asked which when he is taking she could not tell me exactly, he states that he keeps changing it frequently, denies fever, chest pain, cough, hemoptysis, he does report worsening lower extremity edema. -In ED creatinine is up to 2, baseline 1.6, potassium at 3.4, BNP elevated at 4500, troponin 65> 74 hemoglobin stable at 12, negative respiratory panel, chest x-ray with cardiomegaly and vascular congestion, [facility] hospitalist consulted to admit for CHF.  Admission 11/21/22 to 11/25/22 -acute resp failure due to acute on chronic HFrEF  11/13/2022 to 11/15/2022 for syncope.  The patient was discharged home with a Zio patch, but he states that it "fell off", and he does not know when it fell off.  Telemetry during his hospital admission did not show any dysrhythmias.   Hospitalized 11/04/2022 to 11/05/2022 -Secondary to near syncope, etiology unclear--discharged home on Coreg, losartan, torsemide -Troponins were flat, no concerning dysrhythmias on telemetry.   Hospitalized 08/15/2022 to 08/18/2022 -- Secondary to orthostatic hypotension and acute on chronic HFrEF    04/29/2022 to 05/03/2022 for syncope and COPD exacerbation.

## 2023-04-01 NOTE — Progress Notes (Addendum)
 New pt admit to Med Surg floor. BP: 183/112. MD notified, new order placed, PRN Hydralazine BP >160. Administered to pt - Rechecked at 0200: BP: 126/63. Pt has orders for oxy 2L [place], he refuses. States he does not need it.

## 2023-04-01 NOTE — Plan of Care (Signed)
 This pt exp periods of V-Tach runs throughout the night. Checked pt status, he indicated it was normal for him. He also stated he did not feel his heart racing. Continuing to monitor pt for changes or discomfort.    Problem: Health Behavior/Discharge Planning: Goal: Ability to manage health-related needs will improve Outcome: Progressing   Problem: Clinical Measurements: Goal: Ability to maintain clinical measurements within normal limits will improve Outcome: Progressing Goal: Will remain free from infection Outcome: Progressing Goal: Diagnostic test results will improve Outcome: Progressing Goal: Respiratory complications will improve Outcome: Progressing   Problem: Safety: Goal: Ability to remain free from injury will improve Outcome: Progressing   Problem: Cardiac: Goal: Ability to achieve and maintain adequate cardiopulmonary perfusion will improve Outcome: Progressing

## 2023-04-01 NOTE — Plan of Care (Signed)
  Problem: Clinical Measurements: Goal: Ability to maintain clinical measurements within normal limits will improve Outcome: Progressing   Problem: Cardiac: Goal: Ability to achieve and maintain adequate cardiopulmonary perfusion will improve Outcome: Progressing   

## 2023-04-02 DIAGNOSIS — N179 Acute kidney failure, unspecified: Secondary | ICD-10-CM | POA: Diagnosis not present

## 2023-04-02 DIAGNOSIS — I5043 Acute on chronic combined systolic (congestive) and diastolic (congestive) heart failure: Secondary | ICD-10-CM | POA: Diagnosis not present

## 2023-04-02 DIAGNOSIS — J449 Chronic obstructive pulmonary disease, unspecified: Secondary | ICD-10-CM | POA: Diagnosis not present

## 2023-04-02 DIAGNOSIS — N189 Chronic kidney disease, unspecified: Secondary | ICD-10-CM | POA: Diagnosis not present

## 2023-04-02 LAB — MAGNESIUM: Magnesium: 1.9 mg/dL (ref 1.7–2.4)

## 2023-04-02 LAB — BASIC METABOLIC PANEL
Anion gap: 8 (ref 5–15)
BUN: 29 mg/dL — ABNORMAL HIGH (ref 8–23)
CO2: 23 mmol/L (ref 22–32)
Calcium: 8.5 mg/dL — ABNORMAL LOW (ref 8.9–10.3)
Chloride: 105 mmol/L (ref 98–111)
Creatinine, Ser: 1.91 mg/dL — ABNORMAL HIGH (ref 0.61–1.24)
GFR, Estimated: 38 mL/min — ABNORMAL LOW (ref >60)
Glucose, Bld: 109 mg/dL — ABNORMAL HIGH (ref 70–99)
Potassium: 3.2 mmol/L — ABNORMAL LOW (ref 3.5–5.1)
Sodium: 136 mmol/L (ref 135–145)

## 2023-04-02 MED ORDER — POTASSIUM CHLORIDE CRYS ER 20 MEQ PO TBCR
40.0000 meq | EXTENDED_RELEASE_TABLET | Freq: Two times a day (BID) | ORAL | Status: AC
  Administered 2023-04-02 (×2): 40 meq via ORAL
  Filled 2023-04-02 (×2): qty 2

## 2023-04-02 NOTE — Plan of Care (Signed)
  Problem: Health Behavior/Discharge Planning: Goal: Ability to manage health-related needs will improve Outcome: Progressing   Problem: Clinical Measurements: Goal: Ability to maintain clinical measurements within normal limits will improve Outcome: Progressing Goal: Will remain free from infection Outcome: Progressing Goal: Diagnostic test results will improve Outcome: Progressing Goal: Respiratory complications will improve Outcome: Progressing   Problem: Safety: Goal: Ability to remain free from injury will improve Outcome: Progressing   Problem: Cardiac: Goal: Ability to achieve and maintain adequate cardiopulmonary perfusion will improve Outcome: Progressing

## 2023-04-02 NOTE — Progress Notes (Signed)
   04/02/23 0950  ReDS Vest / Clip  BMI (Calculated) 18.18  Station Marker C  Ruler Value 31  ReDS Value Range 36 - 40  ReDS Actual Value 37

## 2023-04-02 NOTE — Progress Notes (Signed)
 PROGRESS NOTE   Michael [name]  JYN:[id number] DOB: 1955-08-10 DOA: 03/31/2023 PCP: Benita Stabile, MD   Chief Complaint  Patient presents with   Respiratory Distress   Shortness of Breath   Level of care: Telemetry  Brief Admission History:  68 y.o. male, with PMH of HFrEF, orthostatic hypotension, recurrent syncope, CKD-3, CAD, COPD, BCC s/p thyroidectomy and maxillectomy in 2019 at [facility], right carotid stenosis, HTN and tobacco use, patient with multiple admissions in the past, well known to our service, due to acute on chronic systolic CHF, most recently discharged on 02/25/2023 -Patient presented to ED secondary to complaints of shortness of breath, reported progressive over last 3 days, reports mainly on exertion, severe dyspnea upon laying flat, but still using only 1 pillow, patient reports he is compliant with his diuretics, but when asked which when he is taking she could not tell me exactly, he states that he keeps changing it frequently, denies fever, chest pain, cough, hemoptysis, he does report worsening lower extremity edema. -In ED creatinine is up to 2, baseline 1.6, potassium at 3.4, BNP elevated at 4500, troponin 65> 74 hemoglobin stable at 12, negative respiratory panel, chest x-ray with cardiomegaly and vascular congestion, [facility] hospitalist consulted to admit for CHF.   Assessment and Plan:  Acute on chronic HFrEF -Patient presented with dyspnea, orthopnea and edema -Patient with multiple admissions in the past due to medication noncompliance, fluid and salt restriction noncompliance -Significant evidence of volume overload on imaging, significantly elevated BNP, continue IV Lasix 40 mg IV twice daily. -Renal function closely -Continue with daily weights, strict ins and out -Continue with Coreg for now, will hold losartan in the setting of worsening renal function -ReDs vest reading 37 today, follow daily.  Goal is less than 36.    Chronic COPD, emphysema:  Stable. -No wheezing, no indication for steroids -Continue home Breztri, will keep on as needed albuterol   AKI on CKD stage IIIb  -Worsening renal function, concern for cardiorenal syndrome, monitor renal function closely as on diuresis. -Hold nephrotoxic medications  -holding losartan temporarily    Elevated troponin/type II MI/demand ischemia:  -Secondary to demand ischemia from CHF, as well due to worsening renal failure  -does not ACS pattern 65> 74   History of AFib with RVR: -Rate controlled, continue with Coreg for heart rate control, continue with apixaban for anticoagulation   Right carotid stenosis -With vascular surgery Dr. Jettie Booze, candidate for stent or endarterectomy -Continue with medical management, he is on Eliquis statin and Repatha   Basal cell carcinoma of nasolabial fold 06/06/2017 -right partial rhinectomy, resection of left intranasal lesion, skin graft @[facility]. Margins were clear on pathology.   Hypokalemia:  - additional K given   - check Mg--1.9    Tobacco abuse -Patient reports he gets drastically on his smoking, down to 2 cigarettes a day -declined nicotine patch   DVT prophylaxis: apixaban Code Status: full  Family Communication:  Disposition: home    Consultants:   Procedures:   Antimicrobials:    Subjective: Remains very SOB. Legs swollen.     Objective: Vitals:   04/01/23 1957 04/02/23 0622 04/02/23 0950 04/02/23 1107  BP: (!) 141/86 (!) 146/94  138/81  Pulse: 62 62  (!) 58  Resp: 17 17  18   Temp: 98.3 F (36.8 C) 98.1 F (36.7 C)  98 F (36.7 C)  TempSrc: Oral Oral    SpO2: 100% 97%  100%  Weight:  62 kg 60.8 kg   Height:  Intake/Output Summary (Last 24 hours) at 04/02/2023 1128 Last data filed at 04/02/2023 0952 Gross per 24 hour  Intake 960 ml  Output 2350 ml  Net -1390 ml   Filed Weights   04/01/23 0449 04/02/23 0622 04/02/23 0950  Weight: 62.4 kg 62 kg 60.8 kg   Examination:  General exam: Appears calm and  comfortable  Respiratory system: crackles at bases heard posteriorly. Cardiovascular system: normal S1 & S2 heard. Mild JVD, s3 heard, no rubs, gallops or clicks. 2+ pedal edema. Gastrointestinal system: Abdomen is nondistended, soft and nontender. No organomegaly or masses felt. Normal bowel sounds heard. Central nervous system: Alert and oriented. No focal neurological deficits. Extremities: 1+ edema BLEsSymmetric 5 x 5 power. Skin: No rashes, lesions or ulcers. Psychiatry: Judgement and insight appear normal. Mood & affect appropriate.   Data Reviewed: I have personally reviewed following labs and imaging studies  CBC: Recent Labs  Lab 03/31/23 1758 04/01/23 0522  WBC 7.6 7.9  NEUTROABS 5.6  --   HGB 12.0* 11.4*  HCT 38.8* 36.7*  MCV 90.0 88.4  PLT 282 274    Basic Metabolic Panel: Recent Labs  Lab 03/31/23 1758 04/01/23 0522 04/02/23 0343  NA 140 141 136  K 3.4* 3.4* 3.2*  CL 104 107 105  CO2 24 23 23   GLUCOSE 109* 97 109*  BUN 27* 27* 29*  CREATININE 2.05* 1.79* 1.91*  CALCIUM 9.1 9.1 8.5*  MG  --   --  1.9    CBG: No results for input(s): "GLUCAP" in the last 168 hours.  Recent Results (from the past 240 hours)  Resp panel by RT-PCR (RSV, Flu A&B, Covid) Anterior Nasal Swab     Status: None   Collection Time: 03/31/23  6:01 PM   Specimen: Anterior Nasal Swab  Result Value Ref Range Status   SARS Coronavirus 2 by RT PCR NEGATIVE NEGATIVE Final    Comment: (NOTE) SARS-CoV-2 target nucleic acids are NOT DETECTED.  The SARS-CoV-2 RNA is generally detectable in upper respiratory specimens during the acute phase of infection. The lowest concentration of SARS-CoV-2 viral copies this assay can detect is 138 copies/mL. A negative result does not preclude SARS-Cov-2 infection and should not be used as the sole basis for treatment or other patient management decisions. A negative result may occur with  improper specimen collection/handling, submission of specimen  other than nasopharyngeal swab, presence of viral mutation(s) within the areas targeted by this assay, and inadequate number of viral copies(<138 copies/mL). A negative result must be combined with clinical observations, patient history, and epidemiological information. The expected result is Negative.  Fact Sheet for Patients:  BloggerCourse.com  Fact Sheet for Healthcare Providers:  SeriousBroker.it  This test is no t yet approved or cleared by the [place] FDA and  has been authorized for detection and/or diagnosis of SARS-CoV-2 by FDA under an Emergency Use Authorization (EUA). This EUA will remain  in effect (meaning this test can be used) for the duration of the COVID-19 declaration under Section 564(b)(1) of the Act, 21 U.S.C.section 360bbb-3(b)(1), unless the authorization is terminated  or revoked sooner.       Influenza A by PCR NEGATIVE NEGATIVE Final   Influenza B by PCR NEGATIVE NEGATIVE Final    Comment: (NOTE) The Xpert Xpress SARS-CoV-2/FLU/RSV plus assay is intended as an aid in the diagnosis of influenza from Nasopharyngeal swab specimens and should not be used as a sole basis for treatment. Nasal washings and aspirates are unacceptable for Xpert Xpress  SARS-CoV-2/FLU/RSV testing.  Fact Sheet for Patients: BloggerCourse.com  Fact Sheet for Healthcare Providers: SeriousBroker.it  This test is not yet approved or cleared by the [place] FDA and has been authorized for detection and/or diagnosis of SARS-CoV-2 by FDA under an Emergency Use Authorization (EUA). This EUA will remain in effect (meaning this test can be used) for the duration of the COVID-19 declaration under Section 564(b)(1) of the Act, 21 U.S.C. section 360bbb-3(b)(1), unless the authorization is terminated or revoked.     Resp Syncytial Virus by PCR NEGATIVE NEGATIVE Final     Comment: (NOTE) Fact Sheet for Patients: BloggerCourse.com  Fact Sheet for Healthcare Providers: SeriousBroker.it  This test is not yet approved or cleared by the [place] FDA and has been authorized for detection and/or diagnosis of SARS-CoV-2 by FDA under an Emergency Use Authorization (EUA). This EUA will remain in effect (meaning this test can be used) for the duration of the COVID-19 declaration under Section 564(b)(1) of the Act, 21 U.S.C. section 360bbb-3(b)(1), unless the authorization is terminated or revoked.  Performed at [facility], [address] [place], [place] [address]      Radiology Studies: [place] [facility] 1 View Result Date: 03/31/2023 CLINICAL DATA:  Shortness of breath EXAM: PORTABLE CHEST 1 VIEW COMPARISON:  02/21/2023 FINDINGS: Cardiomegaly with mild central congestion. No focal airspace disease, pleural effusion or pneumothorax. Aortic atherosclerosis. IMPRESSION: Cardiomegaly with mild central congestion Electronically Signed   By: Jasmine Pang M.D.   On: 03/31/2023 18:27   Scheduled Meds:  apixaban  5 mg Oral BID   atorvastatin  40 mg Oral Daily   carvedilol  12.5 mg Oral BID WC   umeclidinium bromide  1 puff Inhalation Daily   And   fluticasone furoate-vilanterol  1 puff Inhalation Daily   furosemide  40 mg Intravenous BID   pantoprazole  40 mg Oral Daily   potassium chloride  40 mEq Oral BID   Continuous Infusions:   LOS: 2 days   Time spent: 55 mins  Pranika Finks Laural Benes, MD How to contact the [facility] Attending or Consulting provider 7A - 7P or covering provider during after hours 7P -7A, for this patient?  Check the care team in [facility] and look for a) attending/consulting TRH provider listed and b) the [facility] team listed Log into [email or web address] to find provider on call.  Locate the [facility] provider you are looking for under [facility] and page to a number that you can be directly reached. If you  still have difficulty reaching the provider, please page the [facility] (Director on Call) for the Hospitalists listed on amion for assistance.  04/02/2023, 11:28 AM

## 2023-04-03 ENCOUNTER — Encounter (HOSPITAL_COMMUNITY): Payer: Self-pay | Admitting: Internal Medicine

## 2023-04-03 ENCOUNTER — Telehealth: Payer: Self-pay

## 2023-04-03 DIAGNOSIS — N189 Chronic kidney disease, unspecified: Secondary | ICD-10-CM | POA: Diagnosis not present

## 2023-04-03 DIAGNOSIS — J449 Chronic obstructive pulmonary disease, unspecified: Secondary | ICD-10-CM | POA: Diagnosis not present

## 2023-04-03 DIAGNOSIS — N179 Acute kidney failure, unspecified: Secondary | ICD-10-CM | POA: Diagnosis not present

## 2023-04-03 DIAGNOSIS — I5043 Acute on chronic combined systolic (congestive) and diastolic (congestive) heart failure: Secondary | ICD-10-CM | POA: Diagnosis not present

## 2023-04-03 LAB — BASIC METABOLIC PANEL
Anion gap: 9 (ref 5–15)
BUN: 35 mg/dL — ABNORMAL HIGH (ref 8–23)
CO2: 26 mmol/L (ref 22–32)
Calcium: 9.1 mg/dL (ref 8.9–10.3)
Chloride: 104 mmol/L (ref 98–111)
Creatinine, Ser: 1.92 mg/dL — ABNORMAL HIGH (ref 0.61–1.24)
GFR, Estimated: 37 mL/min — ABNORMAL LOW (ref >60)
Glucose, Bld: 110 mg/dL — ABNORMAL HIGH (ref 70–99)
Potassium: 3.7 mmol/L (ref 3.5–5.1)
Sodium: 139 mmol/L (ref 135–145)

## 2023-04-03 LAB — MAGNESIUM: Magnesium: 2.1 mg/dL (ref 1.7–2.4)

## 2023-04-03 MED ORDER — CARVEDILOL 3.125 MG PO TABS
9.3750 mg | ORAL_TABLET | Freq: Two times a day (BID) | ORAL | Status: DC
  Administered 2023-04-04: 9.375 mg via ORAL
  Filled 2023-04-03 (×3): qty 3

## 2023-04-03 NOTE — Progress Notes (Signed)
 Nurse at bedside,patient alert and oriented to self,place and situation,a little confused to time. Ambulating in room independently.Plan of care on going.

## 2023-04-03 NOTE — Progress Notes (Signed)
 Mobility Specialist Progress Note:    04/03/23 1430  Mobility  Activity Ambulated with assistance in hallway  Level of Assistance Standby assist, set-up cues, supervision of patient - no hands on  Assistive Device None  Distance Ambulated (ft) 110 ft  Range of Motion/Exercises Active;All extremities  Activity Response Tolerated well  Mobility Referral Yes  Mobility visit 1 Mobility  Mobility Specialist Start Time (ACUTE ONLY) 1430  Mobility Specialist Stop Time (ACUTE ONLY) 1450  Mobility Specialist Time Calculation (min) (ACUTE ONLY) 20 min   Pt received in bed, agreeable to mobility. Required SBA to stand and ambulate with no AD. Tolerated well, SpO2 96% on RA at rest. SpO2 dropped to 88% on RA during ambulation, recovered with a rest break, SpO2 93% on RA. Left pt sitting EOB, all needs met.  Vilma Will Mobility Specialist Please contact via Special educational needs teacher or  Rehab office at [phone]

## 2023-04-03 NOTE — Progress Notes (Signed)
   04/03/23 1031  ReDS Vest / Clip  BMI (Calculated) 17.9  Station Marker C  Ruler Value 31  ReDS Value Range < 36  ReDS Actual Value 44

## 2023-04-03 NOTE — Plan of Care (Signed)
  Problem: Safety: Goal: Ability to remain free from injury will improve Outcome: Progressing   Problem: Safety: Goal: Ability to remain free from injury will improve Outcome: Progressing

## 2023-04-03 NOTE — Progress Notes (Signed)
 Heart Failure Nurse Navigator Progress Note  PCP: Benita Stabile, MD PCP-Cardiologist: Nona Dell, MD Admission Diagnosis: Acute on Chronic Congestive Heart Failure, unspecified heart failure type [facility]) Admitted from: home via EMS  Presentation:   Michael [name] presented with shortness of breath for a few weeks. No cough, fever, chest pain or leg swelling noted.  He said he had been compliant with his medications.  Has had a recent CHF exacerbation on 02/25/23. BNP was > 4,500.0. CXR- Cardiomegaly with mild central congestion.  Patient called in his room 311 @ [facility] remotely form [facility] via telephone.  Two patient identifiers confirmed prior to interview.  RN was asked to deliver a CHF education booklet to him at the bedside. SDOH updated and interview conducted.  TOC scheduled and patient aware of date, time and location.  All information entered in Epic on patient discharge instructions.  ECHO/ LVEF: 25%  Clinical Course:  Past Medical History:  Diagnosis Date   Aortic atherosclerosis (HCC) 08/03/2021   CAD (coronary artery disease) 08/03/2021   Cardiac catheterization September 2023 with RCA/RV marginal stenosis managed medically   Cardiomyopathy (HCC)    Carotid artery disease (HCC)    CKD (chronic kidney disease) stage 3, GFR 30-59 ml/min (HCC)    COPD (chronic obstructive pulmonary disease) (HCC)    Essential hypertension    Head and neck cancer (HCC) 2019   Right facial basal cell carcinoma s/p resection with right partial mastectomy and partal rhinectomy with skin graft (06/13/17)   Iron deficiency anemia    Urinary retention      Social History   Socioeconomic History   Marital status: Single    Spouse name: Not on file   Number of children: Not on file   Years of education: 12   Highest education level: 12th grade  Occupational History   Not on file  Tobacco Use   Smoking status: Former    Current packs/day: 0.00     Average packs/day: 0.3 packs/day for 40.0 years (10.0 ttl pk-yrs)    Types: Cigarettes    Start date: 07/02/1981    Quit date: 07/02/2021    Years since quitting: 1.7    Passive exposure: Past   Smokeless tobacco: Never   Tobacco comments:    Quit on 06/09/21  Vaping Use   Vaping status: Never Used  Substance and Sexual Activity   Alcohol use: Not Currently   Drug use: Never   Sexual activity: Not Currently  Other Topics Concern   Not on file  Social History Narrative   Not on file   Social Drivers of Health   Financial Resource Strain: Low Risk  (12/26/2022)   Overall Financial Resource Strain (CARDIA)    Difficulty of Paying Living Expenses: Not hard at all  Food Insecurity: No Food Insecurity (03/31/2023)   Hunger Vital Sign    Worried About Running Out of Food in the Last Year: Never true    Ran Out of Food in the Last Year: Never true  Transportation Needs: No Transportation Needs (03/31/2023)   PRAPARE - Administrator, Civil Service (Medical): No    Lack of Transportation (Non-Medical): No  Physical Activity: Sufficiently Active (12/26/2022)   Exercise Vital Sign    Days of Exercise per Week: 5 days    Minutes of Exercise per Session: 50 min  Stress: No Stress Concern Present (12/26/2022)   [employer] of Occupational Health - Occupational Stress Questionnaire  Feeling of Stress : Only a little  Social Connections: Socially Isolated (03/31/2023)   Social Connection and Isolation Panel [NHANES]    Frequency of Communication with Friends and Family: More than three times a week    Frequency of Social Gatherings with Friends and Family: More than three times a week    Attends Religious Services: Never    Database administrator or Organizations: No    Attends Banker Meetings: Never    Marital Status: Widowed   Education Assessment and Provision:  Detailed education and instructions provided on heart failure disease management including  the following:  Signs and symptoms of Heart Failure When to call the physician Importance of daily weights Low sodium diet Fluid restriction Medication management Anticipated future follow-up appointments  Patient education given on each of the above topics.  Patient acknowledges understanding via teach back method and acceptance of all instructions.  Education Materials:  "Living Better With Heart Failure" Booklet, HF zone tool, & Daily Weight Tracker Tool.  Patient has scale at home: Yes.He is not weighing currently. Patient has pill box at home: Per patient he is compliant with medications.    High Risk Criteria for Readmission and/or Poor Patient Outcomes: Heart failure hospital admissions (last 6 months): 3  No Show rate: 9% Difficult social situation: Doe not drive. Has a friend named Orvilla Fus that drives him to appointments. Demonstrates medication adherence: Yes Primary Language: English Literacy level: Reading, Writing & Comprehension  Barriers of Care:   Transportation  Has a scale but is currently not doing daily weights.  Considerations/Referrals:   Referral made to Heart Failure Pharmacist Stewardship: Yes Referral made to Heart Failure CSW/NCM TOC: No Referral made to Heart & Vascular TOC clinic: Yes. 04/12/23 @ 10:00 AM  Items for Follow-up on DC/TOC: Daily weights- not doing them right now Diet & Fluid Restrictions-admits to consuming about 154 oz of fluid per day.   Continued Heart Failure Education   Roxy Horseman, RN, BSN [facility] Heart Failure Navigator Secure Chat Only

## 2023-04-03 NOTE — Progress Notes (Addendum)
 PROGRESS NOTE   Michael [name]  ZOX:[id number] DOB: 13-Nov-1955 DOA: 03/31/2023 PCP: Benita Stabile, MD   Chief Complaint  Patient presents with   Respiratory Distress   Shortness of Breath   Level of care: Telemetry  Brief Admission History:  68 y.o. male, with PMH of HFrEF, orthostatic hypotension, recurrent syncope, CKD-3, CAD, COPD, BCC s/p thyroidectomy and maxillectomy in 2019 at [facility], right carotid stenosis, HTN and tobacco use, patient with multiple admissions in the past, well known to our service, due to acute on chronic systolic CHF, most recently discharged on 02/25/2023 -Patient presented to ED secondary to complaints of shortness of breath, reported progressive over last 3 days, reports mainly on exertion, severe dyspnea upon laying flat, but still using only 1 pillow, patient reports he is compliant with his diuretics, but when asked which when he is taking she could not tell me exactly, he states that he keeps changing it frequently, denies fever, chest pain, cough, hemoptysis, he does report worsening lower extremity edema. -In ED creatinine is up to 2, baseline 1.6, potassium at 3.4, BNP elevated at 4500, troponin 65> 74 hemoglobin stable at 12, negative respiratory panel, chest x-ray with cardiomegaly and vascular congestion, [facility] hospitalist consulted to admit for CHF.   Assessment and Plan:  Acute on chronic HFrEF -Patient presented with dyspnea, orthopnea and edema -Patient with multiple admissions in the past due to medication noncompliance, fluid and salt restriction noncompliance -Significant evidence of volume overload on imaging, significantly elevated BNP, continue IV Lasix 40 mg IV twice daily. -Renal function closely -Continue with daily weights, strict ins and out -Continue with Coreg for now, will hold losartan in the setting of worsening renal function -ReDs vest reading 44 today, follow daily.  Goal is to try to get closer to 36.    Intake/Output Summary  (Last 24 hours) at 04/03/2023 1257 Last data filed at 04/03/2023 1100 Gross per 24 hour  Intake 960 ml  Output 2750 ml  Net -1790 ml   Filed Weights   04/02/23 0950 04/03/23 0500 04/03/23 1031  Weight: 60.8 kg 62.3 kg 59.9 kg   Bradycardia  - reducing home carvedilol from 12.5 mg to 9.375 mg BID - added hold parameters to carvedilol orders   Chronic COPD, emphysema: Stable. -No wheezing, no indication for steroids -Continue home Breztri, will keep on as needed albuterol   AKI on CKD stage IIIb  -Worsening renal function, concern for cardiorenal syndrome, monitor renal function closely as on diuresis. -Hold nephrotoxic medications  -holding losartan temporarily    Elevated troponin/type II MI/demand ischemia:  -Secondary to demand ischemia from CHF, as well due to worsening renal failure  -does not ACS pattern 65> 74   History of AFib with RVR: -Rate controlled, continue with Coreg for heart rate control, continue with apixaban for anticoagulation   Right carotid stenosis -With vascular surgery Dr. Jettie Booze, candidate for stent or endarterectomy -Continue with medical management, he is on Eliquis statin and Repatha   Basal cell carcinoma of nasolabial fold 06/06/2017 -right partial rhinectomy, resection of left intranasal lesion, skin graft @[facility]. Margins were clear on pathology.   Hypokalemia:  - additional K given   - check Mg--1.9    Tobacco abuse -Patient reports he gets drastically on his smoking, down to 2 cigarettes a day -declined nicotine patch   DVT prophylaxis: apixaban Code Status: full  Family Communication:  Disposition: home    Consultants:   Procedures:   Antimicrobials:    Subjective: Starting  to feel better but not back to baseline. No chest pain but still with SOB.      Objective: Vitals:   04/03/23 0453 04/03/23 0500 04/03/23 0939 04/03/23 1031  BP: (!) 119/99  134/89   Pulse: (!) 58  64   Resp:   19   Temp: 97.6 F (36.4 C)  97.7 F  (36.5 C)   TempSrc: Oral  Oral   SpO2: 100%  100%   Weight:  62.3 kg  59.9 kg  Height:        Intake/Output Summary (Last 24 hours) at 04/03/2023 1254 Last data filed at 04/03/2023 1100 Gross per 24 hour  Intake 960 ml  Output 2750 ml  Net -1790 ml   Filed Weights   04/02/23 0950 04/03/23 0500 04/03/23 1031  Weight: 60.8 kg 62.3 kg 59.9 kg   Examination:  General exam: Appears calm and comfortable  Respiratory system: crackles at bases heard posteriorly. Cardiovascular system: normal S1 & S2 heard. Mild JVD, s3 heard, no rubs, gallops or clicks. 2+ pedal edema. Gastrointestinal system: Abdomen is nondistended, soft and nontender. No organomegaly or masses felt. Normal bowel sounds heard. Central nervous system: Alert and oriented. No focal neurological deficits. Extremities: 1+ edema BLEs.  Symmetric 5 x 5 power. Skin: No rashes, lesions or ulcers. Psychiatry: Judgement and insight appear normal. Mood & affect appropriate.   Data Reviewed: I have personally reviewed following labs and imaging studies  CBC: Recent Labs  Lab 03/31/23 1758 04/01/23 0522  WBC 7.6 7.9  NEUTROABS 5.6  --   HGB 12.0* 11.4*  HCT 38.8* 36.7*  MCV 90.0 88.4  PLT 282 274    Basic Metabolic Panel: Recent Labs  Lab 03/31/23 1758 04/01/23 0522 04/02/23 0343 04/03/23 0553  NA 140 141 136 139  K 3.4* 3.4* 3.2* 3.7  CL 104 107 105 104  CO2 24 23 23 26   GLUCOSE 109* 97 109* 110*  BUN 27* 27* 29* 35*  CREATININE 2.05* 1.79* 1.91* 1.92*  CALCIUM 9.1 9.1 8.5* 9.1  MG  --   --  1.9 2.1    CBG: No results for input(s): "GLUCAP" in the last 168 hours.  Recent Results (from the past 240 hours)  Resp panel by RT-PCR (RSV, Flu A&B, Covid) Anterior Nasal Swab     Status: None   Collection Time: 03/31/23  6:01 PM   Specimen: Anterior Nasal Swab  Result Value Ref Range Status   SARS Coronavirus 2 by RT PCR NEGATIVE NEGATIVE Final    Comment: (NOTE) SARS-CoV-2 target nucleic acids are NOT  DETECTED.  The SARS-CoV-2 RNA is generally detectable in upper respiratory specimens during the acute phase of infection. The lowest concentration of SARS-CoV-2 viral copies this assay can detect is 138 copies/mL. A negative result does not preclude SARS-Cov-2 infection and should not be used as the sole basis for treatment or other patient management decisions. A negative result may occur with  improper specimen collection/handling, submission of specimen other than nasopharyngeal swab, presence of viral mutation(s) within the areas targeted by this assay, and inadequate number of viral copies(<138 copies/mL). A negative result must be combined with clinical observations, patient history, and epidemiological information. The expected result is Negative.  Fact Sheet for Patients:  BloggerCourse.com  Fact Sheet for Healthcare Providers:  SeriousBroker.it  This test is no t yet approved or cleared by the [place] FDA and  has been authorized for detection and/or diagnosis of SARS-CoV-2 by FDA under an Emergency Use Authorization (  EUA). This EUA will remain  in effect (meaning this test can be used) for the duration of the COVID-19 declaration under Section 564(b)(1) of the Act, 21 U.S.C.section 360bbb-3(b)(1), unless the authorization is terminated  or revoked sooner.       Influenza A by PCR NEGATIVE NEGATIVE Final   Influenza B by PCR NEGATIVE NEGATIVE Final    Comment: (NOTE) The Xpert Xpress SARS-CoV-2/FLU/RSV plus assay is intended as an aid in the diagnosis of influenza from Nasopharyngeal swab specimens and should not be used as a sole basis for treatment. Nasal washings and aspirates are unacceptable for Xpert Xpress SARS-CoV-2/FLU/RSV testing.  Fact Sheet for Patients: BloggerCourse.com  Fact Sheet for Healthcare Providers: SeriousBroker.it  This test is not yet  approved or cleared by the [place] FDA and has been authorized for detection and/or diagnosis of SARS-CoV-2 by FDA under an Emergency Use Authorization (EUA). This EUA will remain in effect (meaning this test can be used) for the duration of the COVID-19 declaration under Section 564(b)(1) of the Act, 21 U.S.C. section 360bbb-3(b)(1), unless the authorization is terminated or revoked.     Resp Syncytial Virus by PCR NEGATIVE NEGATIVE Final    Comment: (NOTE) Fact Sheet for Patients: BloggerCourse.com  Fact Sheet for Healthcare Providers: SeriousBroker.it  This test is not yet approved or cleared by the [place] FDA and has been authorized for detection and/or diagnosis of SARS-CoV-2 by FDA under an Emergency Use Authorization (EUA). This EUA will remain in effect (meaning this test can be used) for the duration of the COVID-19 declaration under Section 564(b)(1) of the Act, 21 U.S.C. section 360bbb-3(b)(1), unless the authorization is terminated or revoked.  Performed at [facility], [address], [place], [place] [address]     Radiology Studies: No results found.  Scheduled Meds:  apixaban  5 mg Oral BID   atorvastatin  40 mg Oral Daily   carvedilol  12.5 mg Oral BID WC   umeclidinium bromide  1 puff Inhalation Daily   And   fluticasone furoate-vilanterol  1 puff Inhalation Daily   furosemide  40 mg Intravenous BID   pantoprazole  40 mg Oral Daily   Continuous Infusions:   LOS: 3 days   Time spent: 55 mins  Anna Beaird Laural Benes, MD How to contact the [facility] Attending or Consulting provider 7A - 7P or covering provider during after hours 7P -7A, for this patient?  Check the care team in [facility] and look for a) attending/consulting TRH provider listed and b) the [facility] team listed Log into [email or web address] to find provider on call.  Locate the [facility] provider you are looking for under [facility] and page to a number  that you can be directly reached. If you still have difficulty reaching the provider, please page the [facility] (Director on Call) for the Hospitalists listed on amion for assistance.  04/03/2023, 12:54 PM

## 2023-04-03 NOTE — TOC Initial Note (Signed)
 Transition of Care [facility]) - Initial/Assessment Note    Patient Details  Name: Michael [name] MRN: [id number] Date of Birth: 1955/05/29  [facility]) CM/SW Contact:    Elliot Gault, LCSW Phone Number: 04/03/2023, 2:27 PM  Clinical Narrative:                  Pt admitted from home. Pt known to TOC from previous admissions. He resides with friends and has been able to get to appointments and obtain medications.   CHF education information added to pt's AVS.  TOC will follow and assist with dc planning.   Expected Discharge Plan: Home/Self Care Barriers to Discharge: Continued Medical Work up   Patient Goals and CMS Choice Patient states their goals for this hospitalization and ongoing recovery are:: return home          Expected Discharge Plan and Services In-house Referral: Clinical Social Work     Living arrangements for the past 2 months: Single Family Home                                      Prior Living Arrangements/Services Living arrangements for the past 2 months: Single Family Home Lives with:: Friends Patient language and need for interpreter reviewed:: Yes Do you feel safe going back to the place where you live?: Yes      Need for Family Participation in Patient Care: No (Comment) Care giver support system in place?: Yes (comment)   Criminal Activity/Legal Involvement Pertinent to Current Situation/Hospitalization: No - Comment as needed  Activities of Daily Living   ADL Screening (condition at time of admission) Independently performs ADLs?: Yes (appropriate for developmental age) Is the patient deaf or have difficulty hearing?: No Does the patient have difficulty seeing, even when wearing glasses/contacts?: No Does the patient have difficulty concentrating, remembering, or making decisions?: No  Permission Sought/Granted                  Emotional Assessment       Orientation: : Oriented to Self, Oriented to  Place, Oriented to  Time, Oriented to Situation Alcohol / Substance Use: Not Applicable Psych Involvement: No (comment)  Admission diagnosis:  Acute on chronic combined systolic and diastolic CHF (congestive heart failure) (HCC) [I50.43] Patient Active Problem List   Diagnosis Date Noted   Acute on chronic HFrEF (heart failure with reduced ejection fraction) (HCC) 02/21/2023   Atrial fibrillation with rapid ventricular response (HCC) 02/05/2023   Body mass index (BMI) 27.0-27.9, adult 12/01/2022   H/O prostatectomy 12/01/2022   Malnutrition of moderate degree 11/24/2022   Chronic obstructive pulmonary disease (HCC) 11/22/2022   Chronic diastolic CHF (congestive heart failure) (HCC) 11/22/2022   Near syncope 11/04/2022   Calculus of gallbladder without cholecystitis without obstruction 08/10/2022   Elevated troponin level not due myocardial infarction 07/29/2022   Acute on chronic combined systolic and diastolic CHF (congestive heart failure) (HCC) 07/24/2022   History of basal cell carcinoma (BCC) of skin 05/17/2022   Nicotine dependence, cigarettes, uncomplicated 05/17/2022   Noncompliance with treatment plan 05/17/2022   Orthostatic hypotension 05/17/2022   Vitamin D deficiency 05/17/2022   Troponin level elevated 04/29/2022   Elevated brain natriuretic peptide (BNP) level 04/29/2022   Solitary pulmonary nodule 04/26/2022   First degree heart block 04/12/2022   Fracture of rib 02/27/2022   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   COPD (chronic obstructive  pulmonary disease) (HCC) 11/03/2021   Acute non-ST segment elevation myocardial infarction (HCC) 11/03/2021   Aneurysm of infrarenal abdominal aorta (HCC) 11/03/2021   Functional gait abnormality 11/03/2021   Hypoalbuminemia 11/03/2021   Hypomagnesemia 11/03/2021   Mixed hyperlipidemia 11/03/2021   Moderate protein-calorie malnutrition (HCC) 11/03/2021   Physical deconditioning 11/03/2021   Proteinuria 11/03/2021    Transaminitis 10/23/2021   Pressure injury of skin 10/22/2021   Type 2 MI (myocardial infarction) (HCC) 10/20/2021   Myocardial infarction (HCC) 10/20/2021   Atrophy of pancreas 08/11/2021   Mass of colon 08/06/2021   Preoperative cardiovascular examination 08/03/2021   CAD (coronary artery disease) 08/03/2021   Stage 3 chronic kidney disease (HCC) 08/03/2021   Hardening of the aorta (main artery of the heart) (HCC) 08/03/2021   Rectal hemorrhage 06/16/2021   History of adenomatous polyp of colon 06/16/2021   Carotid stenosis, right 02/15/2021   Occlusion and stenosis of right carotid artery 02/15/2021   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 12/06/2019   Incisional hernia 11/15/2019   Elevated PSA 06/19/2019   Benign prostatic hyperplasia with urinary obstruction 10/29/2018   CAP (community acquired pneumonia) 05/09/2018   Hypertensive emergency 05/09/2018   Chronic combined systolic and diastolic CHF (congestive heart failure) (HCC) 05/09/2018   Lobar pneumonia (HCC) 05/09/2018   Retention of urine 11/27/2017   Vasovagal syncope 09/17/2017   Sepsis due to urinary tract infection (HCC) 09/01/2017   Syncope and collapse 07/05/2017   COPD with acute exacerbation (HCC) 07/05/2017   Acute kidney injury superimposed on CKD (HCC) 07/05/2017   Urinary tract infectious disease 07/05/2017   Leukocytosis 07/05/2017   Incomplete bladder emptying 07/05/2017   UTI (urinary tract infection) 07/05/2017   Disorder of carotid artery (HCC) 06/25/2017   Orthostatic syncope    Syncope due to orthostatic hypotension 06/24/2017   Cardiac murmur 06/24/2017   Prolonged QT interval 06/24/2017   Imaging of gastrointestinal tract abnormal 05/25/2017   Basal cell carcinoma of eyelid 05/15/2017   Basal cell carcinoma (BCC) of nostril 05/12/2017   Skin lesion of face 05/03/2017   Other specified disorders of nose and nasal sinuses 05/03/2017   Periapical abscess 04/04/2017   Iron deficiency  anemia 04/04/2017   Essential (primary) hypertension 04/02/2017   Anemia of chronic disease 04/02/2017   Acute hypoxemic respiratory failure (HCC) 04/02/2017   Lactic acidosis 04/02/2017   Tobacco abuse 04/02/2017   Elevated troponin 04/02/2017   Thrombocytosis 04/02/2017   Hypokalemia 04/02/2017   Unspecified diastolic (congestive) heart failure (HCC) 04/02/2017   Nasal lesion    PCP:  Benita Stabile, MD Pharmacy:   [facility] - [place], [place] - [address] [place] [address] Phone: [phone] Fax: [phone]     Social Drivers of Health (SDOH) Social History: SDOH Screenings   Food Insecurity: No Food Insecurity (03/31/2023)  Housing: High Risk (03/31/2023)  Transportation Needs: No Transportation Needs (03/31/2023)  Utilities: Not At Risk (03/31/2023)  Alcohol Screen: Low Risk  (12/26/2022)  Depression (PHQ2-9): Low Risk  (12/26/2022)  Financial Resource Strain: Low Risk  (12/26/2022)  Physical Activity: Sufficiently Active (12/26/2022)  Social Connections: Socially Isolated (03/31/2023)  Stress: No Stress Concern Present (12/26/2022)  Tobacco Use: Medium Risk (04/03/2023)  Health Literacy: Adequate Health Literacy (12/26/2022)   SDOH Interventions:     Readmission Risk Interventions    04/02/2023    4:13 PM 11/22/2022    1:58 PM 07/25/2022    1:09 PM  Readmission Risk Prevention Plan  Transportation Screening Complete Complete Complete  Medication Review Oceanographer) Complete Complete Complete  PCP or Specialist appointment within 3-5 days of discharge Complete  Not Complete  HRI or Home Care Consult Complete Complete Complete  SW Recovery Care/Counseling Consult Complete Complete Complete  Palliative Care Screening Not Applicable Not Applicable Complete  Skilled Nursing Facility Not Applicable Not Applicable Not Applicable

## 2023-04-03 NOTE — Plan of Care (Signed)
 Pt complained of having difficulties breathing, gave 2-3 oxy via [place], pt stated he felt better, no longer needed the  for assistance, asked for it to be removed..   Problem: Health Behavior/Discharge Planning: Goal: Ability to manage health-related needs will improve Outcome: Progressing   Problem: Clinical Measurements: Goal: Ability to maintain clinical measurements within normal limits will improve Outcome: Progressing Goal: Will remain free from infection Outcome: Progressing Goal: Diagnostic test results will improve Outcome: Progressing Goal: Respiratory complications will improve Outcome: Progressing   Problem: Safety: Goal: Ability to remain free from injury will improve Outcome: Progressing

## 2023-04-03 NOTE — Care Management Important Message (Signed)
 Important Message  Patient Details  Name: Michael [name] MRN: [id number] Date of Birth: 31-Jul-1955   Important Message Given:  N/A - LOS <3 / Initial given by admissions     Corey Harold 04/03/2023, 2:46 PM

## 2023-04-04 DIAGNOSIS — N179 Acute kidney failure, unspecified: Secondary | ICD-10-CM | POA: Diagnosis not present

## 2023-04-04 DIAGNOSIS — J449 Chronic obstructive pulmonary disease, unspecified: Secondary | ICD-10-CM | POA: Diagnosis not present

## 2023-04-04 DIAGNOSIS — N189 Chronic kidney disease, unspecified: Secondary | ICD-10-CM | POA: Diagnosis not present

## 2023-04-04 DIAGNOSIS — I5043 Acute on chronic combined systolic (congestive) and diastolic (congestive) heart failure: Secondary | ICD-10-CM | POA: Diagnosis not present

## 2023-04-04 LAB — BASIC METABOLIC PANEL
Anion gap: 10 (ref 5–15)
BUN: 39 mg/dL — ABNORMAL HIGH (ref 8–23)
CO2: 26 mmol/L (ref 22–32)
Calcium: 8.8 mg/dL — ABNORMAL LOW (ref 8.9–10.3)
Chloride: 102 mmol/L (ref 98–111)
Creatinine, Ser: 1.92 mg/dL — ABNORMAL HIGH (ref 0.61–1.24)
GFR, Estimated: 37 mL/min — ABNORMAL LOW (ref >60)
Glucose, Bld: 98 mg/dL (ref 70–99)
Potassium: 3 mmol/L — ABNORMAL LOW (ref 3.5–5.1)
Sodium: 138 mmol/L (ref 135–145)

## 2023-04-04 LAB — MAGNESIUM: Magnesium: 2 mg/dL (ref 1.7–2.4)

## 2023-04-04 MED ORDER — CARVEDILOL 3.125 MG PO TABS
6.2500 mg | ORAL_TABLET | Freq: Two times a day (BID) | ORAL | Status: DC
Start: 2023-04-05 — End: 2023-04-06
  Administered 2023-04-05 – 2023-04-06 (×3): 6.25 mg via ORAL
  Filled 2023-04-04 (×3): qty 2

## 2023-04-04 MED ORDER — POTASSIUM CHLORIDE CRYS ER 20 MEQ PO TBCR
40.0000 meq | EXTENDED_RELEASE_TABLET | Freq: Two times a day (BID) | ORAL | Status: DC
  Administered 2023-04-04 – 2023-04-06 (×5): 40 meq via ORAL
  Filled 2023-04-04 (×5): qty 2

## 2023-04-04 NOTE — Consult Note (Addendum)
 [facility] Liaison Consult Note   04/04/2023  Jessy Cybulski [birth year] [id number]  Primary Care Provider:  Nita Sells, MD [facility])  Patient is currently active with Care Management for chronic disease management services.  Patient has been engaged by a community RNCM and Child psychotherapist. Our community based plan of care has focused on disease management and community resource support.   Patient will receive a post hospital call and will be evaluated for assessments and disease process education.   Plan: Pending discharge disposition.  Inpatient Transition Of Care [TOC] team member to make aware that Care Management following.  Of note, Care Management services does not replace or interfere with any services that are needed or arranged by inpatient [facility] care management team.   For additional questions or referrals please contact:  isa Ashley Royalty, RN, [facility] Liaison [place]   [facility], Population Health Office Hours MTWF  8:00 am-6:00 pm Direct Dial: [phone] mobile [email or web address]

## 2023-04-04 NOTE — Progress Notes (Signed)
   04/04/23 0800  ReDS Vest / Clip  Station Marker C  Ruler Value 32  ReDS Value Range 36 - 40  ReDS Actual Value 40

## 2023-04-04 NOTE — Progress Notes (Signed)
 PROGRESS NOTE   Michael [name]  ZOX:[id number] DOB: 11/08/1955 DOA: 03/31/2023 PCP: Benita Stabile, MD   Chief Complaint  Patient presents with   Respiratory Distress   Shortness of Breath   Level of care: Telemetry  Brief Admission History:  68 y.o. male, with PMH of HFrEF, orthostatic hypotension, recurrent syncope, CKD-3, CAD, COPD, BCC s/p thyroidectomy and maxillectomy in 2019 at [facility], right carotid stenosis, HTN and tobacco use, patient with multiple admissions in the past, well known to our service, due to acute on chronic systolic CHF, most recently discharged on 02/25/2023 -Patient presented to ED secondary to complaints of shortness of breath, reported progressive over last 3 days, reports mainly on exertion, severe dyspnea upon laying flat, but still using only 1 pillow, patient reports he is compliant with his diuretics, but when asked which when he is taking she could not tell me exactly, he states that he keeps changing it frequently, denies fever, chest pain, cough, hemoptysis, he does report worsening lower extremity edema. -In ED creatinine is up to 2, baseline 1.6, potassium at 3.4, BNP elevated at 4500, troponin 65> 74 hemoglobin stable at 12, negative respiratory panel, chest x-ray with cardiomegaly and vascular congestion, [facility] hospitalist consulted to admit for CHF.   Assessment and Plan:  Acute on chronic HFrEF -Patient presented with dyspnea, orthopnea and edema -Patient with multiple admissions in the past due to medication noncompliance, fluid and salt restriction noncompliance -Significant evidence of volume overload on imaging, significantly elevated BNP, continue IV Lasix 40 mg IV twice daily. -monitoring Renal function closely -Continue with daily weights, strict ins and out -Continue with Coreg for now, will hold losartan in the setting of worsening renal function -ReDs vest reading down to 40 today, follow daily.  Goal is to try to get closer to 36.     Intake/Output Summary (Last 24 hours) at 04/04/2023 1316 Last data filed at 04/04/2023 1222 Gross per 24 hour  Intake 720 ml  Output 2150 ml  Net -1430 ml   Filed Weights   04/03/23 0500 04/03/23 1031 04/04/23 0448  Weight: 62.3 kg 59.9 kg 60.3 kg   Bradycardia  - reduced home carvedilol from 12.5 mg to 9.375 mg BID - added hold parameters to carvedilol orders   Chronic COPD, emphysema: Stable. -No wheezing, no indication for steroids -Continue home Breztri, will keep on as needed albuterol   AKI on CKD stage IIIb  -Worsening renal function, concern for cardiorenal syndrome, monitor renal function closely as on diuresis. -Hold nephrotoxic medications  -holding losartan temporarily    Elevated troponin/type II MI/demand ischemia:  -Secondary to demand ischemia from CHF, as well due to worsening renal failure  -does not ACS pattern 65> 74   History of AFib with RVR: -Rate controlled, continue with Coreg for heart rate control, continue with apixaban for anticoagulation   Right carotid stenosis -With vascular surgery Dr. Jettie Booze, candidate for stent or endarterectomy -Continue with medical management, he is on Eliquis statin and Repatha   Basal cell carcinoma of nasolabial fold 06/06/2017 -right partial rhinectomy, resection of left intranasal lesion, skin graft @[facility]. Margins were clear on pathology.   Hypokalemia:  - additional K given   - check Mg--1.9    Tobacco abuse -Patient reports he gets drastically on his smoking, down to 2 cigarettes a day -declined nicotine patch   DVT prophylaxis: apixaban Code Status: full  Family Communication:  Disposition: home    Consultants:   Procedures:   Antimicrobials:  Subjective: Pt remains SOB.  He is continuing to diurese.        Objective: Vitals:   04/03/23 2029 04/04/23 0437 04/04/23 0448 04/04/23 1035  BP: 135/71 (!) 145/86  (!) 153/80  Pulse: (!) 58 70  62  Resp: 20 19  19   Temp: 98.4 F (36.9 C) 98 F  (36.7 C)  98 F (36.7 C)  TempSrc: Oral   Oral  SpO2: 100% 96%  98%  Weight:   60.3 kg   Height:        Intake/Output Summary (Last 24 hours) at 04/04/2023 1316 Last data filed at 04/04/2023 1222 Gross per 24 hour  Intake 720 ml  Output 2150 ml  Net -1430 ml   Filed Weights   04/03/23 0500 04/03/23 1031 04/04/23 0448  Weight: 62.3 kg 59.9 kg 60.3 kg   Examination:  General exam: Appears calm and comfortable  Respiratory system: crackles at bases heard posteriorly. Cardiovascular system: normal S1 & S2 heard. Mild JVD, s3 heard, no rubs, gallops or clicks. 2+ pedal edema. Gastrointestinal system: Abdomen is nondistended, soft and nontender. No organomegaly or masses felt. Normal bowel sounds heard. Central nervous system: Alert and oriented. No focal neurological deficits. Extremities: 1+ edema BLEs.  Symmetric 5 x 5 power. Skin: No rashes, lesions or ulcers. Psychiatry: Judgement and insight appear normal. Mood & affect appropriate.   Data Reviewed: I have personally reviewed following labs and imaging studies  CBC: Recent Labs  Lab 03/31/23 1758 04/01/23 0522  WBC 7.6 7.9  NEUTROABS 5.6  --   HGB 12.0* 11.4*  HCT 38.8* 36.7*  MCV 90.0 88.4  PLT 282 274    Basic Metabolic Panel: Recent Labs  Lab 03/31/23 1758 04/01/23 0522 04/02/23 0343 04/03/23 0553 04/04/23 0335  NA 140 141 136 139 138  K 3.4* 3.4* 3.2* 3.7 3.0*  CL 104 107 105 104 102  CO2 24 23 23 26 26   GLUCOSE 109* 97 109* 110* 98  BUN 27* 27* 29* 35* 39*  CREATININE 2.05* 1.79* 1.91* 1.92* 1.92*  CALCIUM 9.1 9.1 8.5* 9.1 8.8*  MG  --   --  1.9 2.1 2.0    CBG: No results for input(s): "GLUCAP" in the last 168 hours.  Recent Results (from the past 240 hours)  Resp panel by RT-PCR (RSV, Flu A&B, Covid) Anterior Nasal Swab     Status: None   Collection Time: 03/31/23  6:01 PM   Specimen: Anterior Nasal Swab  Result Value Ref Range Status   SARS Coronavirus 2 by RT PCR NEGATIVE NEGATIVE Final     Comment: (NOTE) SARS-CoV-2 target nucleic acids are NOT DETECTED.  The SARS-CoV-2 RNA is generally detectable in upper respiratory specimens during the acute phase of infection. The lowest concentration of SARS-CoV-2 viral copies this assay can detect is 138 copies/mL. A negative result does not preclude SARS-Cov-2 infection and should not be used as the sole basis for treatment or other patient management decisions. A negative result may occur with  improper specimen collection/handling, submission of specimen other than nasopharyngeal swab, presence of viral mutation(s) within the areas targeted by this assay, and inadequate number of viral copies(<138 copies/mL). A negative result must be combined with clinical observations, patient history, and epidemiological information. The expected result is Negative.  Fact Sheet for Patients:  BloggerCourse.com  Fact Sheet for Healthcare Providers:  SeriousBroker.it  This test is no t yet approved or cleared by the [place] FDA and  has been authorized for detection  and/or diagnosis of SARS-CoV-2 by FDA under an Emergency Use Authorization (EUA). This EUA will remain  in effect (meaning this test can be used) for the duration of the COVID-19 declaration under Section 564(b)(1) of the Act, 21 U.S.C.section 360bbb-3(b)(1), unless the authorization is terminated  or revoked sooner.       Influenza A by PCR NEGATIVE NEGATIVE Final   Influenza B by PCR NEGATIVE NEGATIVE Final    Comment: (NOTE) The Xpert Xpress SARS-CoV-2/FLU/RSV plus assay is intended as an aid in the diagnosis of influenza from Nasopharyngeal swab specimens and should not be used as a sole basis for treatment. Nasal washings and aspirates are unacceptable for Xpert Xpress SARS-CoV-2/FLU/RSV testing.  Fact Sheet for Patients: BloggerCourse.com  Fact Sheet for Healthcare  Providers: SeriousBroker.it  This test is not yet approved or cleared by the [place] FDA and has been authorized for detection and/or diagnosis of SARS-CoV-2 by FDA under an Emergency Use Authorization (EUA). This EUA will remain in effect (meaning this test can be used) for the duration of the COVID-19 declaration under Section 564(b)(1) of the Act, 21 U.S.C. section 360bbb-3(b)(1), unless the authorization is terminated or revoked.     Resp Syncytial Virus by PCR NEGATIVE NEGATIVE Final    Comment: (NOTE) Fact Sheet for Patients: BloggerCourse.com  Fact Sheet for Healthcare Providers: SeriousBroker.it  This test is not yet approved or cleared by the [place] FDA and has been authorized for detection and/or diagnosis of SARS-CoV-2 by FDA under an Emergency Use Authorization (EUA). This EUA will remain in effect (meaning this test can be used) for the duration of the COVID-19 declaration under Section 564(b)(1) of the Act, 21 U.S.C. section 360bbb-3(b)(1), unless the authorization is terminated or revoked.  Performed at [facility], [address], [place], [place] [address]     Radiology Studies: No results found.  Scheduled Meds:  apixaban  5 mg Oral BID   atorvastatin  40 mg Oral Daily   carvedilol  9.375 mg Oral BID WC   umeclidinium bromide  1 puff Inhalation Daily   And   fluticasone furoate-vilanterol  1 puff Inhalation Daily   furosemide  40 mg Intravenous BID   pantoprazole  40 mg Oral Daily   potassium chloride  40 mEq Oral BID   Continuous Infusions:   LOS: 4 days   Time spent: 55 mins  Darril Patriarca Laural Benes, MD How to contact the [facility] Attending or Consulting provider 7A - 7P or covering provider during after hours 7P -7A, for this patient?  Check the care team in [facility] and look for a) attending/consulting [facility] provider listed and b) the [facility] team listed Log into [email or web address]  to find provider on call.  Locate the [facility] provider you are looking for under [facility] and page to a number that you can be directly reached. If you still have difficulty reaching the provider, please page the [facility] (Director on Call) for the Hospitalists listed on amion for assistance.  04/04/2023, 1:16 PM

## 2023-04-04 NOTE — Plan of Care (Signed)
   Problem: Health Behavior/Discharge Planning: Goal: Ability to manage health-related needs will improve Outcome: Progressing   Problem: Clinical Measurements: Goal: Ability to maintain clinical measurements within normal limits will improve Outcome: Progressing Goal: Will remain free from infection Outcome: Progressing Goal: Diagnostic test results will improve Outcome: Progressing Goal: Respiratory complications will improve Outcome: Progressing

## 2023-04-04 NOTE — Progress Notes (Signed)
 Mobility Specialist Progress Note:    04/04/23 1100  Mobility  Activity Ambulated with assistance in hallway  Level of Assistance Standby assist, set-up cues, supervision of patient - no hands on  Assistive Device None  Distance Ambulated (ft) 180 ft  Range of Motion/Exercises Active;All extremities  Mobility Referral Yes  Mobility visit 1 Mobility  Mobility Specialist Start Time (ACUTE ONLY) 1100  Mobility Specialist Stop Time (ACUTE ONLY) 1120  Mobility Specialist Time Calculation (min) (ACUTE ONLY) 20 min   Pt received in bed, agreeable to mobility. Required SBA to stand and ambulate with no AD. Tolerated well, SpO2 96% on RA throughout session. Returned pt supine, all needs met.  Michael [name] Mobility Specialist Please contact via Special educational needs teacher or  Rehab office at [phone]

## 2023-04-04 NOTE — Plan of Care (Signed)
°  Problem: Clinical Measurements: °Goal: Respiratory complications will improve °Outcome: Progressing °  °Problem: Clinical Measurements: °Goal: Respiratory complications will improve °Outcome: Progressing °  °

## 2023-04-04 NOTE — Care Management Important Message (Signed)
 Important Message  Patient Details  Name: Michael [name] MRN: [id number] Date of Birth: 12/18/1955   Important Message Given:  Yes - Medicare IM     Corey Harold 04/04/2023, 1:23 PM

## 2023-04-04 NOTE — Progress Notes (Signed)
 Mobility Specialist Progress Note:    04/04/23 1515  Mobility  Activity Ambulated with assistance in hallway  Level of Assistance Standby assist, set-up cues, supervision of patient - no hands on  Assistive Device None  Distance Ambulated (ft) 150 ft  Range of Motion/Exercises Active;All extremities  Activity Response Tolerated well  Mobility Referral Yes  Mobility visit 1 Mobility  Mobility Specialist Start Time (ACUTE ONLY) 1515  Mobility Specialist Stop Time (ACUTE ONLY) 1530  Mobility Specialist Time Calculation (min) (ACUTE ONLY) 15 min   Pt received standing EOB, agreeable to mobility. Required SBA to ambulate with no AD. Tolerated well, asx throughout. Left pt sitting EOB, all needs met.  Hallelujah Wysong Mobility Specialist Please contact via Special educational needs teacher or  Rehab office at [phone]

## 2023-04-04 NOTE — Progress Notes (Signed)
 Nurse at bedside,patient's blood pressure was 133/80,heart rate 59,holding coreg per MD's orders at this time.Plan of care on going.

## 2023-04-05 DIAGNOSIS — I5043 Acute on chronic combined systolic (congestive) and diastolic (congestive) heart failure: Secondary | ICD-10-CM | POA: Diagnosis not present

## 2023-04-05 DIAGNOSIS — I4891 Unspecified atrial fibrillation: Secondary | ICD-10-CM | POA: Diagnosis not present

## 2023-04-05 LAB — BASIC METABOLIC PANEL
Anion gap: 8 (ref 5–15)
BUN: 41 mg/dL — ABNORMAL HIGH (ref 8–23)
CO2: 26 mmol/L (ref 22–32)
Calcium: 8.8 mg/dL — ABNORMAL LOW (ref 8.9–10.3)
Chloride: 103 mmol/L (ref 98–111)
Creatinine, Ser: 2.02 mg/dL — ABNORMAL HIGH (ref 0.61–1.24)
GFR, Estimated: 35 mL/min — ABNORMAL LOW (ref >60)
Glucose, Bld: 146 mg/dL — ABNORMAL HIGH (ref 70–99)
Potassium: 3.6 mmol/L (ref 3.5–5.1)
Sodium: 137 mmol/L (ref 135–145)

## 2023-04-05 NOTE — Progress Notes (Signed)
 Per Tele patient had a 5 beat run of vtach, Dr. Thomes Dinning made aware no new orders at this time.

## 2023-04-05 NOTE — Progress Notes (Signed)
   04/05/23 0800  ReDS Vest / Clip  Station Marker C  Ruler Value 39  ReDS Value Range 36 - 40  ReDS Actual Value 39

## 2023-04-05 NOTE — Plan of Care (Signed)
  Problem: Health Behavior/Discharge Planning: Goal: Ability to manage health-related needs will improve Outcome: Progressing   Problem: Health Behavior/Discharge Planning: Goal: Ability to manage health-related needs will improve Outcome: Progressing   

## 2023-04-05 NOTE — Progress Notes (Signed)
 Mobility Specialist Progress Note:    04/05/23 1420  Therapy Vitals  Temp 98.2 F (36.8 C)  Temp Source Oral  Pulse Rate 63  Resp 19  BP 131/83  Patient Position (if appropriate) Sitting  Oxygen Therapy  SpO2 100 %  O2 Device Room Air  Mobility  Activity Ambulated with assistance in hallway  Level of Assistance Standby assist, set-up cues, supervision of patient - no hands on  Assistive Device None  Distance Ambulated (ft) 200 ft  Range of Motion/Exercises Active;All extremities  Activity Response Tolerated well  Mobility Referral Yes  Mobility visit 1 Mobility  Mobility Specialist Start Time (ACUTE ONLY) 1400  Mobility Specialist Stop Time (ACUTE ONLY) 1420  Mobility Specialist Time Calculation (min) (ACUTE ONLY) 20 min   Pt received in bed, agreeable to mobility. Required SBA to stand and ambulate with no AD. Tolerated well, SpO2 91% on RA throughout. Returned to room, left pt sitting EOB. All needs met.   Lawerance Bach Mobility Specialist Please contact via Special educational needs teacher or  Rehab office at [phone]

## 2023-04-05 NOTE — Progress Notes (Signed)
 PROGRESS NOTE  Michael [name] UJW:[id number] DOB: 1955/12/28 DOA: 03/31/2023 PCP: Benita Stabile, MD  Brief History:  68 y.o. male, with PMH of HFrEF, orthostatic hypotension, recurrent syncope, CKD-3, CAD, COPD, basal cell carcinoma status post partial rhinectomy and maxillectomy 06/06/2017 at [facility], advanced colonic tubular adenoma, right carotid stenosis, hypertension, hyperlipidemia presents with sob. patient with multiple admissions in the past, well known to our service, due to acute on chronic systolic CHF, most recently 02/21/23 to 02/25/23.  D/C weight 135 at that time.  D/C home with lasix 40 mg bid.  Pt endorses dietary and fluid indiscretion.  This is his seventh admission in past 12 months -Patient presented to ED secondary to complaints of shortness of breath, reported progressive over last 3 days, reports mainly on exertion, severe dyspnea upon laying flat, but still using only 1 pillow, patient reports he is compliant with his diuretics, but when asked which when he is taking she could not tell me exactly, he states that he keeps changing it frequently, denies fever, chest pain, cough, hemoptysis, he does report worsening lower extremity edema. -In ED creatinine is up to 2, baseline 1.6, potassium at 3.4, BNP elevated at 4500, troponin 65> 74 hemoglobin stable at 12, negative respiratory panel, chest x-ray with cardiomegaly and vascular congestion, [facility]ist consulted to admit for CHF.  Admission 11/21/22 to 11/25/22 -acute resp failure due to acute on chronic HFrEF  11/13/2022 to 11/15/2022 for syncope.  The patient was discharged home with a Zio patch, but he states that it "fell off", and he does not know when it fell off.  Telemetry during his hospital admission did not show any dysrhythmias.   Hospitalized 11/04/2022 to 11/05/2022 -Secondary to near syncope, etiology unclear--discharged home on Coreg, losartan, torsemide -Troponins were flat, no concerning  dysrhythmias on telemetry.   Hospitalized 08/15/2022 to 08/18/2022 -- Secondary to orthostatic hypotension and acute on chronic HFrEF   04/29/2022 to 05/03/2022 for syncope and COPD exacerbation.    Assessment/Plan: Acute on chronic HFrEF -He is clinically fluid overloaded with pleural effusions and JVD on exam -11/05/2022 echo EF 25%, mild decreased RV function, global HK, mild MR/TR -08/16/2022 echo--EF 25%, global HK, -04/30/2022 echo EF 35-40%, global HK, normal PASP, mild TR -Entresto was discontinued during his Early April 2024 hospitalization due to dizziness and orthostasis -Continue furosemide 40 mg IV twice daily -Accurate I's and O's -Daily weights   Paroxysmal Atrial Fibrillation -denies any recent palpitations and is in normal sinus rhythm by examination today. Remains on Coreg 12.5mg  twice daily for rate control. - No reports of active bleeding.  -continue Eliquis 5mg  BID for anticoagulation   COPD -Patient continues to smoke -continue BDs   CKD stage IIIb -Baseline creatinine 1.8-2.0   Essential hypertension -continue coreg dose decreased to 9.375 mg bid due to bradycardia  Elevated troponin/type II MI/demand ischemia:  -Secondary to demand ischemia from CHF, as well due to worsening renal failure -troponin 65>>74    Tobacco abuse -Tobacco cessation discussed   Right Carotid stenosis -The patient has follow-up with Dr. Leonette Most fields -05/12/2017 carotid ultrasound shows 80-99% right carotid stenosis - right TCAR in 01/2021 and recent dopplers in 11/2022 showed no hemodynamically significant stenosis along the RICA but was noted to have a moderate to large amount of left-sided plaque which is similar to prior imaging    Basal cell carcinoma of nasolabial fold -06/06/2017--right partial rhinectomy, resection of left intranasal lesion, skin graft @  [facility] -Margins were clear on pathology -Most recent follow-up was 06/23/2017   Iron deficiency anemia/Advanced colonic  adenoma -The patient had colonoscopy 05/31/2017--Dr. Rourk -He was noted to have numerous polyps as well as a polypoid mass in the hepatic flexure to the cecum area -pathology of mass--tubular adenoma -He was referred to general surgery, Dr. Larae Grooms for hemicolectomy -He has been lost to follow-up -noncompliant with po iron  Mixed hyperlipidemia/CAD -Cath in 10/2021 showed mild to moderate nonobstructive CAD with medical management recommended. -no chest pain -continue coreg, atorva and repatha  LDL was previously 18 when checked in 07/2022.   Hypokalemia -replete         Family Communication: no  Family at bedside  Consultants:  none  Code Status:  FULL   DVT Prophylaxis:  apixaban   Procedures: As Listed in Progress Note Above  Antibiotics: None       Subjective: Pt states breathing is improving.  Denies f/c, cp, n/v/d, abd pain  Objective: Vitals:   04/05/23 0502 04/05/23 0951 04/05/23 1420 04/05/23 1713  BP: (!) 146/98 115/72 131/83 (!) 144/97  Pulse: 76 71 63 61  Resp: 20 20 19    Temp: 97.9 F (36.6 C) 97.9 F (36.6 C) 98.2 F (36.8 C)   TempSrc: Axillary Oral Oral   SpO2: 94% 95% 100%   Weight: 59.6 kg     Height:        Intake/Output Summary (Last 24 hours) at 04/05/2023 1804 Last data filed at 04/05/2023 1722 Gross per 24 hour  Intake 480 ml  Output 2300 ml  Net -1820 ml   Weight change: -0.275 kg Exam:  General:  Pt is alert, follows commands appropriately, not in acute distress HEENT: No icterus, No thrush, No neck mass, [place]/AT Cardiovascular: RRR, S1/S2, no rubs, no gallops Respiratory: bibasilar crackles.  No wheeze Abdomen: Soft/+BS, non tender, non distended, no guarding Extremities: No edema, No lymphangitis, No petechiae, No rashes, no synovitis   Data Reviewed: I have personally reviewed following labs and imaging studies Basic Metabolic Panel: Recent Labs  Lab 04/01/23 0522 04/02/23 0343 04/03/23 0553  04/04/23 0335 04/05/23 0318  NA 141 136 139 138 137  K 3.4* 3.2* 3.7 3.0* 3.6  CL 107 105 104 102 103  CO2 23 23 26 26 26   GLUCOSE 97 109* 110* 98 146*  BUN 27* 29* 35* 39* 41*  CREATININE 1.79* 1.91* 1.92* 1.92* 2.02*  CALCIUM 9.1 8.5* 9.1 8.8* 8.8*  MG  --  1.9 2.1 2.0  --    Liver Function Tests: No results for input(s): "AST", "ALT", "ALKPHOS", "BILITOT", "PROT", "ALBUMIN" in the last 168 hours. No results for input(s): "LIPASE", "AMYLASE" in the last 168 hours. No results for input(s): "AMMONIA" in the last 168 hours. Coagulation Profile: No results for input(s): "INR", "PROTIME" in the last 168 hours. CBC: Recent Labs  Lab 03/31/23 1758 04/01/23 0522  WBC 7.6 7.9  NEUTROABS 5.6  --   HGB 12.0* 11.4*  HCT 38.8* 36.7*  MCV 90.0 88.4  PLT 282 274   Cardiac Enzymes: No results for input(s): "CKTOTAL", "CKMB", "CKMBINDEX", "TROPONINI" in the last 168 hours. BNP: Invalid input(s): "POCBNP" CBG: No results for input(s): "GLUCAP" in the last 168 hours. HbA1C: No results for input(s): "HGBA1C" in the last 72 hours. Urine analysis:    Component Value Date/Time   COLORURINE YELLOW 11/21/2022 2100   APPEARANCEUR CLEAR 11/21/2022 2100   APPEARANCEUR Cloudy (A) 06/16/2021 0936   LABSPEC 1.017 11/21/2022 2100  LABSPEC 1.018 04/23/2011 1044   PHURINE 5.0 11/21/2022 2100   GLUCOSEU NEGATIVE 11/21/2022 2100   GLUCOSEU Negative 04/23/2011 1044   HGBUR NEGATIVE 11/21/2022 2100   BILIRUBINUR NEGATIVE 11/21/2022 2100   BILIRUBINUR Negative 06/16/2021 0936   BILIRUBINUR Negative 04/23/2011 1044   KETONESUR NEGATIVE 11/21/2022 2100   PROTEINUR 100 (A) 11/21/2022 2100   NITRITE NEGATIVE 11/21/2022 2100   LEUKOCYTESUR NEGATIVE 11/21/2022 2100   LEUKOCYTESUR Trace 04/23/2011 1044   Sepsis Labs: @LABRCNTIP (procalcitonin:4,lacticidven:4) ) Recent Results (from the past 240 hours)  Resp panel by RT-PCR (RSV, Flu A&B, Covid) Anterior Nasal Swab     Status: None   Collection  Time: 03/31/23  6:01 PM   Specimen: Anterior Nasal Swab  Result Value Ref Range Status   SARS Coronavirus 2 by RT PCR NEGATIVE NEGATIVE Final    Comment: (NOTE) SARS-CoV-2 target nucleic acids are NOT DETECTED.  The SARS-CoV-2 RNA is generally detectable in upper respiratory specimens during the acute phase of infection. The lowest concentration of SARS-CoV-2 viral copies this assay can detect is 138 copies/mL. A negative result does not preclude SARS-Cov-2 infection and should not be used as the sole basis for treatment or other patient management decisions. A negative result may occur with  improper specimen collection/handling, submission of specimen other than nasopharyngeal swab, presence of viral mutation(s) within the areas targeted by this assay, and inadequate number of viral copies(<138 copies/mL). A negative result must be combined with clinical observations, patient history, and epidemiological information. The expected result is Negative.  Fact Sheet for Patients:  BloggerCourse.com  Fact Sheet for Healthcare Providers:  SeriousBroker.it  This test is no t yet approved or cleared by the [place] FDA and  has been authorized for detection and/or diagnosis of SARS-CoV-2 by FDA under an Emergency Use Authorization (EUA). This EUA will remain  in effect (meaning this test can be used) for the duration of the COVID-19 declaration under Section 564(b)(1) of the Act, 21 U.S.C.section 360bbb-3(b)(1), unless the authorization is terminated  or revoked sooner.       Influenza A by PCR NEGATIVE NEGATIVE Final   Influenza B by PCR NEGATIVE NEGATIVE Final    Comment: (NOTE) The Xpert Xpress SARS-CoV-2/FLU/RSV plus assay is intended as an aid in the diagnosis of influenza from Nasopharyngeal swab specimens and should not be used as a sole basis for treatment. Nasal washings and aspirates are unacceptable for Xpert Xpress  SARS-CoV-2/FLU/RSV testing.  Fact Sheet for Patients: BloggerCourse.com  Fact Sheet for Healthcare Providers: SeriousBroker.it  This test is not yet approved or cleared by the [place] FDA and has been authorized for detection and/or diagnosis of SARS-CoV-2 by FDA under an Emergency Use Authorization (EUA). This EUA will remain in effect (meaning this test can be used) for the duration of the COVID-19 declaration under Section 564(b)(1) of the Act, 21 U.S.C. section 360bbb-3(b)(1), unless the authorization is terminated or revoked.     Resp Syncytial Virus by PCR NEGATIVE NEGATIVE Final    Comment: (NOTE) Fact Sheet for Patients: BloggerCourse.com  Fact Sheet for Healthcare Providers: SeriousBroker.it  This test is not yet approved or cleared by the [place] FDA and has been authorized for detection and/or diagnosis of SARS-CoV-2 by FDA under an Emergency Use Authorization (EUA). This EUA will remain in effect (meaning this test can be used) for the duration of the COVID-19 declaration under Section 564(b)(1) of the Act, 21 U.S.C. section 360bbb-3(b)(1), unless the authorization is terminated or revoked.  Performed at [facility]  [facility], [address] [place], [place] [address]      Scheduled Meds:  apixaban  5 mg Oral BID   atorvastatin  40 mg Oral Daily   carvedilol  6.25 mg Oral BID WC   umeclidinium bromide  1 puff Inhalation Daily   And   fluticasone furoate-vilanterol  1 puff Inhalation Daily   furosemide  40 mg Intravenous BID   pantoprazole  40 mg Oral Daily   potassium chloride  40 mEq Oral BID   Continuous Infusions:  Procedures/Studies: DG Chest Port 1 View Result Date: 03/31/2023 CLINICAL DATA:  Shortness of breath EXAM: PORTABLE CHEST 1 VIEW COMPARISON:  02/21/2023 FINDINGS: Cardiomegaly with mild central congestion. No focal airspace disease,  pleural effusion or pneumothorax. Aortic atherosclerosis. IMPRESSION: Cardiomegaly with mild central congestion Electronically Signed   By: Jasmine Pang M.D.   On: 03/31/2023 18:27    Catarina Hartshorn, DO  [facility]  If 7PM-7AM, please contact night-coverage [email or web address] Password [email or web address] 04/05/2023, 6:04 PM   LOS: 5 days

## 2023-04-05 NOTE — Plan of Care (Signed)
   Problem: Health Behavior/Discharge Planning: Goal: Ability to manage health-related needs will improve Outcome: Progressing   Problem: Clinical Measurements: Goal: Ability to maintain clinical measurements within normal limits will improve Outcome: Progressing

## 2023-04-06 DIAGNOSIS — I5043 Acute on chronic combined systolic (congestive) and diastolic (congestive) heart failure: Secondary | ICD-10-CM | POA: Diagnosis not present

## 2023-04-06 DIAGNOSIS — N1832 Chronic kidney disease, stage 3b: Secondary | ICD-10-CM

## 2023-04-06 DIAGNOSIS — I4891 Unspecified atrial fibrillation: Secondary | ICD-10-CM | POA: Diagnosis not present

## 2023-04-06 LAB — BASIC METABOLIC PANEL
Anion gap: 5 (ref 5–15)
BUN: 44 mg/dL — ABNORMAL HIGH (ref 8–23)
CO2: 28 mmol/L (ref 22–32)
Calcium: 9.2 mg/dL (ref 8.9–10.3)
Chloride: 106 mmol/L (ref 98–111)
Creatinine, Ser: 2 mg/dL — ABNORMAL HIGH (ref 0.61–1.24)
GFR, Estimated: 36 mL/min — ABNORMAL LOW (ref >60)
Glucose, Bld: 100 mg/dL — ABNORMAL HIGH (ref 70–99)
Potassium: 4 mmol/L (ref 3.5–5.1)
Sodium: 139 mmol/L (ref 135–145)

## 2023-04-06 LAB — MAGNESIUM: Magnesium: 2.2 mg/dL (ref 1.7–2.4)

## 2023-04-06 MED ORDER — FUROSEMIDE 40 MG PO TABS: 40.0000 mg | ORAL_TABLET | Freq: Two times a day (BID) | ORAL | 1 refills | Status: DC

## 2023-04-06 MED ORDER — FUROSEMIDE 40 MG PO TABS: 40.0000 mg | ORAL_TABLET | Freq: Two times a day (BID) | ORAL | Status: DC

## 2023-04-06 MED ORDER — CARVEDILOL 6.25 MG PO TABS: 6.2500 mg | ORAL_TABLET | Freq: Two times a day (BID) | ORAL | 1 refills | Status: DC

## 2023-04-06 NOTE — TOC Transition Note (Signed)
 Transition of Care [facility]) - Discharge Note   Patient Details  Name: Michael [name] MRN: [id number] Date of Birth: [birth year]  [facility]) CM/SW Contact:  Elliot Gault, LCSW Phone Number: 04/06/2023, 11:50 AM   Clinical Narrative:     Pt discharged home earlier today. Pt did not have any new TOC needs for dc.  Final next level of care: Home/Self Care Barriers to Discharge: Barriers Resolved   Patient Goals and CMS Choice Patient states their goals for this hospitalization and ongoing recovery are:: return home          Discharge Placement                       Discharge Plan and Services Additional resources added to the After Visit Summary for   In-house Referral: Clinical Social Work                                   Social Drivers of Health (SDOH) Interventions SDOH Screenings   Food Insecurity: No Food Insecurity (03/31/2023)  Housing: Low Risk  (04/03/2023)  Recent Concern: Housing - High Risk (03/31/2023)  Transportation Needs: No Transportation Needs (03/31/2023)  Utilities: Not At Risk (03/31/2023)  Alcohol Screen: Low Risk  (12/26/2022)  Depression (PHQ2-9): Low Risk  (12/26/2022)  Financial Resource Strain: Low Risk  (12/26/2022)  Physical Activity: Sufficiently Active (12/26/2022)  Social Connections: Socially Isolated (03/31/2023)  Stress: No Stress Concern Present (12/26/2022)  Tobacco Use: Medium Risk (04/03/2023)  Health Literacy: Adequate Health Literacy (12/26/2022)     Readmission Risk Interventions    04/02/2023    4:13 PM 11/22/2022    1:58 PM 07/25/2022    1:09 PM  Readmission Risk Prevention Plan  Transportation Screening Complete Complete Complete  Medication Review Oceanographer) Complete Complete Complete  PCP or Specialist appointment within 3-5 days of discharge Complete  Not Complete  HRI or Home Care Consult Complete Complete Complete  SW Recovery Care/Counseling Consult Complete Complete Complete   Palliative Care Screening Not Applicable Not Applicable Complete  Skilled Nursing Facility Not Applicable Not Applicable Not Applicable

## 2023-04-06 NOTE — Discharge Summary (Signed)
 Physician Discharge Summary   Patient: Michael [name] MRN: [id number] DOB: March 26, 1955  Admit date:     03/31/2023  Discharge date: 04/06/23  Discharge Physician: Onalee Hua Lyndle Pang   PCP: Benita Stabile, MD   Recommendations at discharge:   Please follow up with primary care provider within 1-2 weeks  Please repeat BMP and CBC in one week     Hospital Course: 68 y.o. male, with PMH of HFrEF, orthostatic hypotension, recurrent syncope, CKD-3, CAD, COPD, basal cell carcinoma status post partial rhinectomy and maxillectomy 06/06/2017 at [facility], advanced colonic tubular adenoma, right carotid stenosis, hypertension, hyperlipidemia presents with sob. patient with multiple admissions in the past, well known to our service, due to acute on chronic systolic CHF, most recently 02/21/23 to 02/25/23.  D/C weight 135 at that time.  D/C home with lasix 40 mg bid.  Pt endorses dietary and fluid indiscretion.  This is his seventh admission in past 12 months -Patient presented to ED secondary to complaints of shortness of breath, reported progressive over last 3 days, reports mainly on exertion, severe dyspnea upon laying flat, but still using only 1 pillow, patient reports he is compliant with his diuretics, but when asked which when he is taking she could not tell me exactly, he states that he keeps changing it frequently, denies fever, chest pain, cough, hemoptysis, he does report worsening lower extremity edema. -In ED creatinine is up to 2, baseline 1.6, potassium at 3.4, BNP elevated at 4500, troponin 65> 74 hemoglobin stable at 12, negative respiratory panel, chest x-ray with cardiomegaly and vascular congestion, [facility] hospitalist consulted to admit for CHF.  Admission 11/21/22 to 11/25/22 -acute resp failure due to acute on chronic HFrEF  11/13/2022 to 11/15/2022 for syncope.  The patient was discharged home with a Zio patch, but he states that it "fell off", and he does not know when it fell off.  Telemetry  during his hospital admission did not show any dysrhythmias.   Hospitalized 11/04/2022 to 11/05/2022 -Secondary to near syncope, etiology unclear--discharged home on Coreg, losartan, torsemide -Troponins were flat, no concerning dysrhythmias on telemetry.   Hospitalized 08/15/2022 to 08/18/2022 -- Secondary to orthostatic hypotension and acute on chronic HFrEF   04/29/2022 to 05/03/2022 for syncope and COPD exacerbation.  Assessment and Plan:  Acute on chronic HFrEF -He is clinically fluid overloaded with pleural effusions and JVD on exam -11/05/2022 echo EF 25%, mild decreased RV function, global HK, mild MR/TR -08/16/2022 echo--EF 25%, global HK, -04/30/2022 echo EF 35-40%, global HK, normal PASP, mild TR -Entresto was discontinued during his Early April 2024 hospitalization due to dizziness and orthostasis -Continue furosemide 40 mg IV twice daily -d/c home with lasix 40 mg po bid -Accurate I's and O's--NEG 9.4L -Daily weights--132 lbs at time d/c -ReDS = 31 on day of dc   Paroxysmal Atrial Fibrillation -denies any recent palpitations and is in normal sinus rhythm by examination today.  -coreg decreased to 6.25 mg bid due to bradycardia into 50s -BP remains well controlled - No reports of active bleeding.  -continue Eliquis 5mg  BID for anticoagulation   COPD -Patient continues to smoke -continue BDs   CKD stage IIIb -Baseline creatinine 1.8-2.0 -serum creatinine 2.00 at time of d/c   Essential hypertension --coreg decreased to 6.25 mg bid due to bradycardia into 50s -BP remains well controlled   Elevated troponin/type II MI/demand ischemia:  -Secondary to demand ischemia from CHF, as well due to worsening renal failure -troponin 65>>74    Tobacco abuse -  Tobacco cessation discussed   Right Carotid stenosis -The patient has follow-up with Dr. Leonette Most fields -05/12/2017 carotid ultrasound shows 80-99% right carotid stenosis - right TCAR in 01/2021 and recent dopplers in  11/2022 showed no hemodynamically significant stenosis along the RICA but was noted to have a moderate to large amount of left-sided plaque which is similar to prior imaging    Basal cell carcinoma of nasolabial fold -06/06/2017--right partial rhinectomy, resection of left intranasal lesion, skin graft @[facility] -Margins were clear on pathology -Most recent follow-up was 06/23/2017   Iron deficiency anemia/Advanced colonic adenoma -The patient had colonoscopy 05/31/2017--Dr. Rourk -He was noted to have numerous polyps as well as a polypoid mass in the hepatic flexure to the cecum area -pathology of mass--tubular adenoma -He was referred to general surgery, Dr. Larae Grooms for hemicolectomy -He has been lost to follow-up -noncompliant with po iron   Mixed hyperlipidemia/CAD -Cath in 10/2021 showed mild to moderate nonobstructive CAD with medical management recommended. -no chest pain -continue coreg, atorva and repatha  LDL was previously 18 when checked in 07/2022.   Hypokalemia -replete           Consultants: none Procedures performed: none  Disposition: Home Diet recommendation:  Cardiac diet DISCHARGE MEDICATION: Allergies as of 04/06/2023   No Known Allergies      Medication List     STOP taking these medications    torsemide 20 MG tablet Commonly known as: DEMADEX       TAKE these medications    apixaban 5 MG Tabs tablet Commonly known as: ELIQUIS Take 1 tablet (5 mg total) by mouth 2 (two) times daily.   atorvastatin 40 MG tablet Commonly known as: LIPITOR Take 1 tablet (40 mg total) by mouth every evening. What changed: when to take this   Breztri Aerosphere 160-9-4.8 MCG/ACT Aero Generic drug: Budeson-Glycopyrrol-Formoterol Inhale 2 puffs into the lungs 2 (two) times daily.   carvedilol 6.25 MG tablet Commonly known as: COREG Take 1 tablet (6.25 mg total) by mouth 2 (two) times daily with a meal. What changed:  medication strength how much to  take   feeding supplement Liqd Take 237 mLs by mouth 3 (three) times daily between meals. What changed: when to take this   furosemide 40 MG tablet Commonly known as: LASIX Take 1 tablet (40 mg total) by mouth 2 (two) times daily. Start taking on: April 07, 2023   Ilevro 0.3 % ophthalmic suspension Generic drug: nepafenac Place 1 drop into both eyes in the morning and at bedtime.   losartan 25 MG tablet Commonly known as: COZAAR Take 25 mg by mouth daily.   nitroGLYCERIN 0.4 MG SL tablet Commonly known as: NITROSTAT Place 1 tablet (0.4 mg total) under the tongue every 5 (five) minutes as needed for chest pain.   pantoprazole 40 MG tablet Commonly known as: Protonix Take 1 tablet (40 mg total) by mouth daily.   potassium chloride SA 20 MEQ tablet Commonly known as: KLOR-CON M Take 1 tablet (20 mEq total) by mouth daily.   Repatha SureClick 140 MG/ML Soaj Generic drug: Evolocumab Inject 1 mL into the skin every 14 (fourteen) days.        Follow-up Information     [facility]. Go on 04/12/2023.   Specialty: Cardiology Why: [facility] Follow-Up 04/12/23 @ 10:00AM Please bring all medications with you to appointment [address], [address], [address] , Second Floor Free Valet Parking at the  door Contact information: [address] [place] [address] [phone]               Discharge Exam: Ceasar Mons Weights   04/04/23 0448 04/05/23 0502 04/06/23 0338  Weight: 60.3 kg 59.6 kg 59.9 kg   HEENT:  [place]/AT, No thrush, no icterus CV:  RRR, no rub, no S3, no S4 Lung:  CTA, no wheeze, no rhonchi Abd:  soft/+BS, NT Ext:  No edema, no lymphangitis, no synovitis, no rash   Condition at discharge: stable  The results of significant diagnostics from this hospitalization (including imaging, microbiology, ancillary and laboratory) are listed below for reference.   Imaging  Studies: DG Chest Port 1 View Result Date: 03/31/2023 CLINICAL DATA:  Shortness of breath EXAM: PORTABLE CHEST 1 VIEW COMPARISON:  02/21/2023 FINDINGS: Cardiomegaly with mild central congestion. No focal airspace disease, pleural effusion or pneumothorax. Aortic atherosclerosis. IMPRESSION: Cardiomegaly with mild central congestion Electronically Signed   By: Jasmine Pang M.D.   On: 03/31/2023 18:27    Microbiology: Results for orders placed or performed during the hospital encounter of 03/31/23  Resp panel by RT-PCR (RSV, Flu A&B, Covid) Anterior Nasal Swab     Status: None   Collection Time: 03/31/23  6:01 PM   Specimen: Anterior Nasal Swab  Result Value Ref Range Status   SARS Coronavirus 2 by RT PCR NEGATIVE NEGATIVE Final    Comment: (NOTE) SARS-CoV-2 target nucleic acids are NOT DETECTED.  The SARS-CoV-2 RNA is generally detectable in upper respiratory specimens during the acute phase of infection. The lowest concentration of SARS-CoV-2 viral copies this assay can detect is 138 copies/mL. A negative result does not preclude SARS-Cov-2 infection and should not be used as the sole basis for treatment or other patient management decisions. A negative result may occur with  improper specimen collection/handling, submission of specimen other than nasopharyngeal swab, presence of viral mutation(s) within the areas targeted by this assay, and inadequate number of viral copies(<138 copies/mL). A negative result must be combined with clinical observations, patient history, and epidemiological information. The expected result is Negative.  Fact Sheet for Patients:  BloggerCourse.com  Fact Sheet for Healthcare Providers:  SeriousBroker.it  This test is no t yet approved or cleared by the [place] FDA and  has been authorized for detection and/or diagnosis of SARS-CoV-2 by FDA under an Emergency Use Authorization (EUA). This EUA  will remain  in effect (meaning this test can be used) for the duration of the COVID-19 declaration under Section 564(b)(1) of the Act, 21 U.S.C.section 360bbb-3(b)(1), unless the authorization is terminated  or revoked sooner.       Influenza A by PCR NEGATIVE NEGATIVE Final   Influenza B by PCR NEGATIVE NEGATIVE Final    Comment: (NOTE) The Xpert Xpress SARS-CoV-2/FLU/RSV plus assay is intended as an aid in the diagnosis of influenza from Nasopharyngeal swab specimens and should not be used as a sole basis for treatment. Nasal washings and aspirates are unacceptable for Xpert Xpress SARS-CoV-2/FLU/RSV testing.  Fact Sheet for Patients: BloggerCourse.com  Fact Sheet for Healthcare Providers: SeriousBroker.it  This test is not yet approved or cleared by the [place] FDA and has been authorized for detection and/or diagnosis of SARS-CoV-2 by FDA under an Emergency Use Authorization (EUA). This EUA will remain in effect (meaning this test can be used) for the duration of the COVID-19 declaration under Section 564(b)(1) of the Act, 21 U.S.C. section 360bbb-3(b)(1), unless the authorization is terminated or revoked.  Resp Syncytial Virus by PCR NEGATIVE NEGATIVE Final    Comment: (NOTE) Fact Sheet for Patients: BloggerCourse.com  Fact Sheet for Healthcare Providers: SeriousBroker.it  This test is not yet approved or cleared by the [place] FDA and has been authorized for detection and/or diagnosis of SARS-CoV-2 by FDA under an Emergency Use Authorization (EUA). This EUA will remain in effect (meaning this test can be used) for the duration of the COVID-19 declaration under Section 564(b)(1) of the Act, 21 U.S.C. section 360bbb-3(b)(1), unless the authorization is terminated or revoked.  Performed at [facility], [address] [place], [place] [address]      Labs: CBC: Recent Labs  Lab 03/31/23 1758 04/01/23 0522  WBC 7.6 7.9  NEUTROABS 5.6  --   HGB 12.0* 11.4*  HCT 38.8* 36.7*  MCV 90.0 88.4  PLT 282 274   Basic Metabolic Panel: Recent Labs  Lab 04/02/23 0343 04/03/23 0553 04/04/23 0335 04/05/23 0318 04/06/23 0342  NA 136 139 138 137 139  K 3.2* 3.7 3.0* 3.6 4.0  CL 105 104 102 103 106  CO2 23 26 26 26 28   GLUCOSE 109* 110* 98 146* 100*  BUN 29* 35* 39* 41* 44*  CREATININE 1.91* 1.92* 1.92* 2.02* 2.00*  CALCIUM 8.5* 9.1 8.8* 8.8* 9.2  MG 1.9 2.1 2.0  --  2.2   Liver Function Tests: No results for input(s): "AST", "ALT", "ALKPHOS", "BILITOT", "PROT", "ALBUMIN" in the last 168 hours. CBG: No results for input(s): "GLUCAP" in the last 168 hours.  Discharge time spent: greater than 30 minutes.  Signed: Catarina Hartshorn, MD [facility] 04/06/2023

## 2023-04-06 NOTE — Plan of Care (Signed)
  Problem: Health Behavior/Discharge Planning: Goal: Ability to manage health-related needs will improve Outcome: Progressing   Problem: Clinical Measurements: Goal: Ability to maintain clinical measurements within normal limits will improve Outcome: Progressing Goal: Respiratory complications will improve Outcome: Progressing

## 2023-04-07 ENCOUNTER — Telehealth: Payer: Self-pay

## 2023-04-07 NOTE — Transitions of Care (Post Inpatient/ED Visit) (Signed)
   04/07/2023  Name: Michael [name] MRN: [id number] DOB: Dec 28, 1955  Today's TOC FU Call Status: Today's TOC FU Call Status:: Unsuccessful Call (1st Attempt) Unsuccessful Call (1st Attempt) Date: 04/07/23  Attempted to reach the patient regarding the most recent Inpatient/ED visit.  Follow Up Plan: Additional outreach attempts will be made to reach the patient to complete the Transitions of Care (Post Inpatient/ED visit) call.   Patient was called in an Outreach attempt to offer VBCI  30-day TOC program. Pt is eligible for program due to potential risk for readmission and/or high utilization. Unfortunately, I was not able to speak with the patient in regards to recent hospital discharge   Left a HIPAA compliant phone message for patient including VBCI CM contact information with request for a call back in regard to recent hospital discharge with his friend who answered the phone   Susa Loffler , BSN, RN [facility]   VBCI-Population Health RN Care Manager Direct Dial [phone]  Fax: [phone] Website: [email or web address]

## 2023-04-10 ENCOUNTER — Telehealth: Payer: Self-pay

## 2023-04-10 DIAGNOSIS — I509 Heart failure, unspecified: Secondary | ICD-10-CM | POA: Diagnosis not present

## 2023-04-10 DIAGNOSIS — N183 Chronic kidney disease, stage 3 unspecified: Secondary | ICD-10-CM | POA: Diagnosis not present

## 2023-04-10 DIAGNOSIS — Z7901 Long term (current) use of anticoagulants: Secondary | ICD-10-CM | POA: Diagnosis not present

## 2023-04-10 DIAGNOSIS — F17210 Nicotine dependence, cigarettes, uncomplicated: Secondary | ICD-10-CM | POA: Diagnosis not present

## 2023-04-10 DIAGNOSIS — I5023 Acute on chronic systolic (congestive) heart failure: Secondary | ICD-10-CM | POA: Diagnosis not present

## 2023-04-10 DIAGNOSIS — F17200 Nicotine dependence, unspecified, uncomplicated: Secondary | ICD-10-CM | POA: Diagnosis not present

## 2023-04-10 DIAGNOSIS — I482 Chronic atrial fibrillation, unspecified: Secondary | ICD-10-CM | POA: Diagnosis not present

## 2023-04-10 NOTE — Transitions of Care (Post Inpatient/ED Visit) (Signed)
   04/10/2023  Name: Michael [name] MRN: [id number] DOB: [birth year]  Today's TOC FU Call Status: Today's TOC FU Call Status:: Unsuccessful Call (2nd Attempt) Unsuccessful Call (1st Attempt) Date: 04/07/23 Unsuccessful Call (2nd Attempt) Date: 04/10/23  Attempted to reach the patient regarding the most recent Inpatient/ED visit. Patient was called in an Outreach attempt to offer VBCI  30-day TOC program. Pt is eligible for program due to potential risk for readmission and/or high utilization. Unfortunately, I was not able to speak with the patient in regards to recent hospital discharge    Left a message with friend Rene Kocher   Follow Up Plan: Additional outreach attempts will be made to reach the patient to complete the Transitions of Care (Post Inpatient/ED visit) call.   Susa Loffler , BSN, RN [facility]   VBCI-Population Health RN Care Manager Direct Dial [phone]  Fax: [phone] Website: [email or web address]

## 2023-04-11 ENCOUNTER — Telehealth: Payer: Self-pay

## 2023-04-11 ENCOUNTER — Telehealth: Payer: Self-pay | Admitting: Family

## 2023-04-11 NOTE — Transitions of Care (Post Inpatient/ED Visit) (Signed)
   04/11/2023  Name: Michael [name] MRN: [id number] DOB: 20-Feb-1955  Today's TOC FU Call Status: Today's TOC FU Call Status:: Unsuccessful Call (3rd Attempt) Unsuccessful Call (1st Attempt) Date: 04/07/23 Unsuccessful Call (2nd Attempt) Date: 04/10/23 Unsuccessful Call (3rd Attempt) Date: 04/11/23  Attempted to reach the patient regarding the most recent Inpatient/ED visit I spoke to a friend who has answered the call all 3 times Message left with his friend  including contact info,    Three attempts have been made to reach the patient regarding the most recent Inpatient/ED visit.    Based on the VBCI program guidelines, if  we are unable to reach the patient  after 3 attempts, no additional outreach attempts will be made and the TOC follow-up will be closed Unfortunately, we have been unable to make contact with the patient for follow up.   The [facility] Case Management Team is available to follow up with the patient after provider conversation with the patient regarding recommendation for care management engagement and subsequent re-referral to the case management team.The VBCI CM team can be reached by calling [phone]. Marland Kitchen    Follow Up Plan: No further outreach attempts will be made at this time. We have been unable to contact the patient. Susa Loffler , BSN, RN [facility]   VBCI-Population Health RN Care Manager Direct Dial [phone]  Fax: [phone] Website: [email or web address]

## 2023-04-12 ENCOUNTER — Encounter: Admitting: Family

## 2023-04-17 ENCOUNTER — Emergency Department (HOSPITAL_COMMUNITY)

## 2023-04-17 ENCOUNTER — Inpatient Hospital Stay (HOSPITAL_COMMUNITY)
Admission: EM | Admit: 2023-04-17 | Discharge: 2023-04-21 | DRG: 291 | Disposition: A | Discharge status: Home / Self Care | Attending: Internal Medicine | Admitting: Internal Medicine

## 2023-04-17 ENCOUNTER — Encounter (HOSPITAL_COMMUNITY): Payer: Self-pay

## 2023-04-17 ENCOUNTER — Other Ambulatory Visit: Payer: Self-pay

## 2023-04-17 DIAGNOSIS — Z85828 Personal history of other malignant neoplasm of skin: Secondary | ICD-10-CM | POA: Diagnosis not present

## 2023-04-17 DIAGNOSIS — I428 Other cardiomyopathies: Secondary | ICD-10-CM | POA: Diagnosis present

## 2023-04-17 DIAGNOSIS — I440 Atrioventricular block, first degree: Secondary | ICD-10-CM | POA: Diagnosis present

## 2023-04-17 DIAGNOSIS — Z1152 Encounter for screening for COVID-19: Secondary | ICD-10-CM

## 2023-04-17 DIAGNOSIS — Z87440 Personal history of urinary (tract) infections: Secondary | ICD-10-CM | POA: Diagnosis not present

## 2023-04-17 DIAGNOSIS — J439 Emphysema, unspecified: Secondary | ICD-10-CM | POA: Diagnosis not present

## 2023-04-17 DIAGNOSIS — Z604 Social exclusion and rejection: Secondary | ICD-10-CM | POA: Diagnosis not present

## 2023-04-17 DIAGNOSIS — Z91148 Patient's other noncompliance with medication regimen for other reason: Secondary | ICD-10-CM

## 2023-04-17 DIAGNOSIS — I959 Hypotension, unspecified: Secondary | ICD-10-CM | POA: Diagnosis present

## 2023-04-17 DIAGNOSIS — Z87891 Personal history of nicotine dependence: Secondary | ICD-10-CM

## 2023-04-17 DIAGNOSIS — Z9049 Acquired absence of other specified parts of digestive tract: Secondary | ICD-10-CM | POA: Diagnosis not present

## 2023-04-17 DIAGNOSIS — Z79899 Other long term (current) drug therapy: Secondary | ICD-10-CM | POA: Diagnosis not present

## 2023-04-17 DIAGNOSIS — Z823 Family history of stroke: Secondary | ICD-10-CM

## 2023-04-17 DIAGNOSIS — E876 Hypokalemia: Secondary | ICD-10-CM | POA: Diagnosis present

## 2023-04-17 DIAGNOSIS — J449 Chronic obstructive pulmonary disease, unspecified: Secondary | ICD-10-CM | POA: Diagnosis not present

## 2023-04-17 DIAGNOSIS — I5020 Unspecified systolic (congestive) heart failure: Secondary | ICD-10-CM | POA: Diagnosis not present

## 2023-04-17 DIAGNOSIS — I5043 Acute on chronic combined systolic (congestive) and diastolic (congestive) heart failure: Secondary | ICD-10-CM | POA: Diagnosis not present

## 2023-04-17 DIAGNOSIS — N400 Enlarged prostate without lower urinary tract symptoms: Secondary | ICD-10-CM | POA: Diagnosis present

## 2023-04-17 DIAGNOSIS — R0602 Shortness of breath: Secondary | ICD-10-CM | POA: Diagnosis not present

## 2023-04-17 DIAGNOSIS — I493 Ventricular premature depolarization: Secondary | ICD-10-CM | POA: Diagnosis present

## 2023-04-17 DIAGNOSIS — Z7901 Long term (current) use of anticoagulants: Secondary | ICD-10-CM

## 2023-04-17 DIAGNOSIS — I10 Essential (primary) hypertension: Secondary | ICD-10-CM | POA: Diagnosis present

## 2023-04-17 DIAGNOSIS — I4891 Unspecified atrial fibrillation: Secondary | ICD-10-CM | POA: Diagnosis not present

## 2023-04-17 DIAGNOSIS — I509 Heart failure, unspecified: Principal | ICD-10-CM

## 2023-04-17 DIAGNOSIS — I252 Old myocardial infarction: Secondary | ICD-10-CM

## 2023-04-17 DIAGNOSIS — E785 Hyperlipidemia, unspecified: Secondary | ICD-10-CM | POA: Diagnosis not present

## 2023-04-17 DIAGNOSIS — Z8589 Personal history of malignant neoplasm of other organs and systems: Secondary | ICD-10-CM

## 2023-04-17 DIAGNOSIS — I480 Paroxysmal atrial fibrillation: Secondary | ICD-10-CM | POA: Diagnosis not present

## 2023-04-17 DIAGNOSIS — I130 Hypertensive heart and chronic kidney disease with heart failure and stage 1 through stage 4 chronic kidney disease, or unspecified chronic kidney disease: Secondary | ICD-10-CM | POA: Diagnosis not present

## 2023-04-17 DIAGNOSIS — I110 Hypertensive heart disease with heart failure: Secondary | ICD-10-CM | POA: Diagnosis not present

## 2023-04-17 DIAGNOSIS — I2510 Atherosclerotic heart disease of native coronary artery without angina pectoris: Secondary | ICD-10-CM | POA: Diagnosis not present

## 2023-04-17 DIAGNOSIS — I700 Atherosclerosis of aorta: Secondary | ICD-10-CM | POA: Diagnosis not present

## 2023-04-17 DIAGNOSIS — I517 Cardiomegaly: Secondary | ICD-10-CM | POA: Diagnosis not present

## 2023-04-17 DIAGNOSIS — N1832 Chronic kidney disease, stage 3b: Secondary | ICD-10-CM | POA: Diagnosis not present

## 2023-04-17 DIAGNOSIS — R0989 Other specified symptoms and signs involving the circulatory and respiratory systems: Secondary | ICD-10-CM | POA: Diagnosis not present

## 2023-04-17 HISTORY — DX: Unspecified atrial fibrillation: I48.91

## 2023-04-17 LAB — CBC WITH DIFFERENTIAL/PLATELET
Abs Immature Granulocytes: 0.02 10*3/uL (ref 0.00–0.07)
Basophils Absolute: 0.1 10*3/uL (ref 0.0–0.1)
Basophils Relative: 1 %
Eosinophils Absolute: 0.1 10*3/uL (ref 0.0–0.5)
Eosinophils Relative: 1 %
HCT: 40.8 % (ref 39.0–52.0)
Hemoglobin: 12.6 g/dL — ABNORMAL LOW (ref 13.0–17.0)
Immature Granulocytes: 0 %
Lymphocytes Relative: 24 %
Lymphs Abs: 2 10*3/uL (ref 0.7–4.0)
MCH: 27.5 pg (ref 26.0–34.0)
MCHC: 30.9 g/dL (ref 30.0–36.0)
MCV: 88.9 fL (ref 80.0–100.0)
Monocytes Absolute: 0.6 10*3/uL (ref 0.1–1.0)
Monocytes Relative: 7 %
Neutro Abs: 5.3 10*3/uL (ref 1.7–7.7)
Neutrophils Relative %: 67 %
Platelets: 265 10*3/uL (ref 150–400)
RBC: 4.59 MIL/uL (ref 4.22–5.81)
RDW: 15.8 % — ABNORMAL HIGH (ref 11.5–15.5)
WBC: 8.1 10*3/uL (ref 4.0–10.5)
nRBC: 0 % (ref 0.0–0.2)

## 2023-04-17 LAB — COMPREHENSIVE METABOLIC PANEL
ALT: 35 U/L (ref 0–44)
AST: 39 U/L (ref 15–41)
Albumin: 3.6 g/dL (ref 3.5–5.0)
Alkaline Phosphatase: 212 U/L — ABNORMAL HIGH (ref 38–126)
Anion gap: 12 (ref 5–15)
BUN: 30 mg/dL — ABNORMAL HIGH (ref 8–23)
CO2: 22 mmol/L (ref 22–32)
Calcium: 9.4 mg/dL (ref 8.9–10.3)
Chloride: 106 mmol/L (ref 98–111)
Creatinine, Ser: 1.9 mg/dL — ABNORMAL HIGH (ref 0.61–1.24)
GFR, Estimated: 38 mL/min — ABNORMAL LOW (ref >60)
Glucose, Bld: 107 mg/dL — ABNORMAL HIGH (ref 70–99)
Potassium: 3.5 mmol/L (ref 3.5–5.1)
Sodium: 140 mmol/L (ref 135–145)
Total Bilirubin: 1.8 mg/dL — ABNORMAL HIGH (ref 0.0–1.2)
Total Protein: 7.4 g/dL (ref 6.5–8.1)

## 2023-04-17 LAB — TROPONIN I (HIGH SENSITIVITY)
Troponin I (High Sensitivity): 79 ng/L — ABNORMAL HIGH (ref <18)
Troponin I (High Sensitivity): 76 ng/L — ABNORMAL HIGH (ref <18)

## 2023-04-17 LAB — BRAIN NATRIURETIC PEPTIDE: B Natriuretic Peptide: >4500 pg/mL — ABNORMAL HIGH (ref 0.0–100.0)

## 2023-04-17 LAB — RESP PANEL BY RT-PCR (RSV, FLU A&B, COVID)  RVPGX2
Influenza A by PCR: NEGATIVE
Influenza B by PCR: NEGATIVE
Resp Syncytial Virus by PCR: NEGATIVE
SARS Coronavirus 2 by RT PCR: NEGATIVE

## 2023-04-17 MED ORDER — FLUTICASONE FUROATE-VILANTEROL 200-25 MCG/ACT IN AEPB: 1.0000 | INHALATION_SPRAY | Freq: Every day | RESPIRATORY_TRACT | Status: DC | PRN

## 2023-04-17 MED ORDER — FUROSEMIDE 10 MG/ML IJ SOLN
80.0000 mg | Freq: Once | INTRAMUSCULAR | Status: AC
  Administered 2023-04-17: 80 mg via INTRAVENOUS
  Filled 2023-04-17: qty 8

## 2023-04-17 MED ORDER — POTASSIUM CHLORIDE CRYS ER 20 MEQ PO TBCR
40.0000 meq | EXTENDED_RELEASE_TABLET | Freq: Once | ORAL | Status: AC
  Administered 2023-04-17: 40 meq via ORAL
  Filled 2023-04-17: qty 2

## 2023-04-17 MED ORDER — LOSARTAN POTASSIUM 25 MG PO TABS
25.0000 mg | ORAL_TABLET | Freq: Every day | ORAL | Status: DC
  Administered 2023-04-17: 25 mg via ORAL
  Filled 2023-04-17: qty 1

## 2023-04-17 MED ORDER — ALBUTEROL SULFATE HFA 108 (90 BASE) MCG/ACT IN AERS: 2.0000 | INHALATION_SPRAY | RESPIRATORY_TRACT | Status: DC | PRN

## 2023-04-17 MED ORDER — CARVEDILOL 3.125 MG PO TABS
6.2500 mg | ORAL_TABLET | Freq: Two times a day (BID) | ORAL | Status: DC
  Administered 2023-04-18 – 2023-04-21 (×7): 6.25 mg via ORAL
  Filled 2023-04-17 (×7): qty 2

## 2023-04-17 MED ORDER — HYDRALAZINE HCL 20 MG/ML IJ SOLN
10.0000 mg | INTRAMUSCULAR | Status: DC | PRN
  Administered 2023-04-17 – 2023-04-18 (×2): 10 mg via INTRAVENOUS
  Filled 2023-04-17 (×2): qty 1

## 2023-04-17 MED ORDER — FUROSEMIDE 10 MG/ML IJ SOLN
40.0000 mg | Freq: Two times a day (BID) | INTRAMUSCULAR | Status: DC
  Administered 2023-04-18: 40 mg via INTRAVENOUS
  Filled 2023-04-17: qty 4

## 2023-04-17 MED ORDER — BUDESON-GLYCOPYRROL-FORMOTEROL 160-9-4.8 MCG/ACT IN AERO
2.0000 | INHALATION_SPRAY | Freq: Two times a day (BID) | RESPIRATORY_TRACT | Status: DC
Start: 2023-04-17 — End: 2023-04-17

## 2023-04-17 MED ORDER — POLYETHYLENE GLYCOL 3350 17 G PO PACK: 17.0000 g | PACK | Freq: Every day | ORAL | Status: DC | PRN

## 2023-04-17 MED ORDER — FLUTICASONE FUROATE-VILANTEROL 200-25 MCG/ACT IN AEPB: 1.0000 | INHALATION_SPRAY | Freq: Every day | RESPIRATORY_TRACT | Status: DC

## 2023-04-17 MED ORDER — ACETAMINOPHEN 650 MG RE SUPP
650.0000 mg | Freq: Four times a day (QID) | RECTAL | Status: DC | PRN
Start: 2023-04-17 — End: 2023-04-21

## 2023-04-17 MED ORDER — ATORVASTATIN CALCIUM 40 MG PO TABS
40.0000 mg | ORAL_TABLET | Freq: Every evening | ORAL | Status: DC
  Administered 2023-04-17 – 2023-04-20 (×4): 40 mg via ORAL
  Filled 2023-04-17 (×4): qty 1

## 2023-04-17 MED ORDER — UMECLIDINIUM BROMIDE 62.5 MCG/ACT IN AEPB: 1.0000 | INHALATION_SPRAY | Freq: Every day | RESPIRATORY_TRACT | Status: DC

## 2023-04-17 MED ORDER — ACETAMINOPHEN 325 MG PO TABS: 650.0000 mg | ORAL_TABLET | Freq: Four times a day (QID) | ORAL | Status: DC | PRN

## 2023-04-17 MED ORDER — APIXABAN 2.5 MG PO TABS
2.5000 mg | ORAL_TABLET | Freq: Two times a day (BID) | ORAL | Status: DC
  Administered 2023-04-17 – 2023-04-21 (×8): 2.5 mg via ORAL
  Filled 2023-04-17 (×8): qty 1

## 2023-04-17 MED ORDER — UMECLIDINIUM BROMIDE 62.5 MCG/ACT IN AEPB: 1.0000 | INHALATION_SPRAY | Freq: Every day | RESPIRATORY_TRACT | Status: DC | PRN

## 2023-04-17 NOTE — H&P (Signed)
 History and Physical    Sohum Delillo WUJ:[id number] DOB: 1955-07-24 DOA: 04/17/2023  PCP: Benita Stabile, MD   Patient coming from: Home  I have personally briefly reviewed patient's old medical records in [facility]  Chief Complaint: Difficulty breathing  HPI: Michael [name] is a 68 y.o. male with medical history significant for systolic and diastolic CHF, atrial fibrillation, COPD, hypertension, CKD 3, BPH. Patient presented to the ED with complaints of difficulty breathing over the past 3 days, reports difficulty breathing with exertion and at rest.  No chest pain.  Reports some mild lower extremity swelling.  No abdominal bloating.  Reports compliance with his medications which include Lasix 40 mg daily. No cough, no fevers no chills.  ED Course: Temperature 97.9.  Heart rate 80s to 97.  Respiratory 13-29.  Blood pressure systolic 154-176.  O2 sat greater than 90% on room air. BNP greater than 4500.  Troponin 79 > 76. COVID flu influenza negative. Chest x-ray with pulmonary vascular congestion, mild edema. 80 mg Lasix given.  Review of Systems: As per HPI all other systems reviewed and negative.  Past Medical History:  Diagnosis Date   Aortic atherosclerosis (HCC) 08/03/2021   Atrial fibrillation (HCC)    CAD (coronary artery disease) 08/03/2021   Cardiac catheterization September 2023 with RCA/RV marginal stenosis managed medically   Cardiomyopathy (HCC)    Carotid artery disease (HCC)    CKD (chronic kidney disease) stage 3, GFR 30-59 ml/min (HCC)    COPD (chronic obstructive pulmonary disease) (HCC)    Essential hypertension    Head and neck cancer (HCC) 2019   Right facial basal cell carcinoma s/p resection with right partial mastectomy and partal rhinectomy with skin graft (06/13/17)   Iron deficiency anemia    Urinary retention     Past Surgical History:  Procedure Laterality Date   ANKLE CLOSED REDUCTION Right    open reduction   BASAL CELL  CARCINOMA EXCISION  2019   at [facility]   BIOPSY  07/19/2021   Procedure: BIOPSY;  Surgeon: Lanelle Bal, DO;  Location: [facility];  Service: Endoscopy;;   CATARACT EXTRACTION W/PHACO Left 01/23/2023   Procedure: CATARACT EXTRACTION PHACO AND INTRAOCULAR LENS PLACEMENT (IOC);  Surgeon: [name], MD;  Location: [facility] ORS;  Service: Ophthalmology;  Laterality: Left;  CDE: 23.26   COLONOSCOPY N/A 05/31/2017   Procedure: COLONOSCOPY;  Surgeon: Corbin Ade, MD;  Location: [facility];  Service: Endoscopy;  Laterality: N/A;  2:45pm   COLONOSCOPY WITH PROPOFOL N/A 07/19/2021   Procedure: COLONOSCOPY WITH PROPOFOL;  Surgeon: Lanelle Bal, DO;  Location: [facility];  Service: Endoscopy;  Laterality: N/A;  3:00pm, moved up to 9:00   CYSTOSCOPY N/A 10/29/2018   Procedure: CYSTOSCOPY, CLOT EVACUATION;  Surgeon: Rene Paci, MD;  Location: [facility] OR[facility];  Service: Urology;  Laterality: N/A;   PARTIAL COLECTOMY Right 08/11/2021   Procedure: PARTIAL COLECTOMY, OPEN RIGHT HEMICOLECTOMY;  Surgeon: Lucretia Roers, MD;  Location: [facility] ORS;  Service: General;  Laterality: Right;   POLYPECTOMY  05/31/2017   Procedure: POLYPECTOMY;  Surgeon: Corbin Ade, MD;  Location: [facility];  Service: Endoscopy;;   RIGHT/LEFT HEART CATH AND CORONARY ANGIOGRAPHY N/A 10/22/2021   Procedure: RIGHT/LEFT HEART CATH AND CORONARY ANGIOGRAPHY;  Surgeon: Orbie Pyo, MD;  Location: [facility] INV[facility];  Service: Cardiovascular;  Laterality: N/A;   TONSILLECTOMY     TRANSCAROTID ARTERY REVASCULARIZATION  Right 02/15/2021   Procedure: RIGHT  TRANSCAROTID ARTERY REVASCULARIZATION;  Surgeon: Cephus Shelling, MD;  Location: [facility] OR;  Service: Vascular;  Laterality: Right;   ULTRASOUND GUIDANCE FOR VASCULAR ACCESS Left 02/15/2021   Procedure: ULTRASOUND GUIDANCE FOR VASCULAR ACCESS;  Surgeon: Cephus Shelling, MD;  Location: [facility] OR;  Service: Vascular;  Laterality: Left;   XI ROBOTIC ASSISTED SIMPLE  PROSTATECTOMY N/A 10/29/2018   Procedure: XI ROBOTIC ASSISTED SIMPLE PROSTATECTOMY;  Surgeon: Malen Gauze, MD;  Location: [facility];  Service: Urology;  Laterality: N/A;     reports that he quit smoking about 21 months ago. His smoking use included cigarettes. He started smoking about 41 years ago. He has a 10 pack-year smoking history. He has been exposed to tobacco smoke. He has never used smokeless tobacco. He reports that he does not currently use alcohol. He reports that he does not use drugs.  No Known Allergies  Family History  Problem Relation Age of Onset   Stroke Father    Cirrhosis Mother    Colon cancer Neg Hx     Prior to Admission medications   Medication Sig Start Date End Date Taking? Authorizing Provider  apixaban (ELIQUIS) 5 MG TABS tablet Take 1 tablet (5 mg total) by mouth 2 (two) times daily. 02/05/23 04/17/23 Yes Loetta Rough, MD  atorvastatin (LIPITOR) 40 MG tablet Take 1 tablet (40 mg total) by mouth every evening. Patient taking differently: Take 40 mg by mouth daily. 08/01/22 02/05/24 Yes Shahmehdi, Gemma Payor, MD  Budeson-Glycopyrrol-Formoterol (BREZTRI AEROSPHERE) 160-9-4.8 MCG/ACT AERO Inhale 2 puffs into the lungs 2 (two) times daily.   Yes [provider]  carvedilol (COREG) 6.25 MG tablet Take 1 tablet (6.25 mg total) by mouth 2 (two) times daily with a meal. 04/06/23  Yes Tat, Onalee Hua, MD  furosemide (LASIX) 40 MG tablet Take 1 tablet (40 mg total) by mouth 2 (two) times daily. 04/07/23  Yes Tat, Onalee Hua, MD  losartan (COZAAR) 25 MG tablet Take 25 mg by mouth daily. 12/23/22  Yes [provider]  nitroGLYCERIN (NITROSTAT) 0.4 MG SL tablet Place 1 tablet (0.4 mg total) under the tongue every 5 (five) minutes as needed for chest pain. 02/05/23  Yes Loetta Rough, MD  potassium chloride SA (KLOR-CON M) 20 MEQ tablet Take 1 tablet (20 mEq total) by mouth daily. 07/26/22 02/05/24 Yes Kendell Bane, MD    Physical Exam: Vitals:   04/17/23 1245  04/17/23 1435 04/17/23 1530 04/17/23 1630  BP: (!) 176/115 (!) 154/100 (!) 175/106   Pulse: 84 83 97 93  Resp: (!) 28 (!) 29 13 (!) 25  Temp:      TempSrc:      SpO2: 94% 96% 90% 96%  Weight:      Height:        Constitutional: NAD, calm, comfortable Vitals:   04/17/23 1245 04/17/23 1435 04/17/23 1530 04/17/23 1630  BP: (!) 176/115 (!) 154/100 (!) 175/106   Pulse: 84 83 97 93  Resp: (!) 28 (!) 29 13 (!) 25  Temp:      TempSrc:      SpO2: 94% 96% 90% 96%  Weight:      Height:       Eyes: PERRL, lids and conjunctivae normal ENMT: Mucous membranes are moist.  Surgical deformity to his right nostril and partly left. Neck: normal, supple, no masses, no thyromegaly Respiratory: clear to auscultation bilaterally, no wheezing, no crackles. Normal respiratory effort. No accessory muscle use.  Cardiovascular: Regular rate and rhythm, no murmurs /  rubs / gallops.  Trace bilateral pitting lower extremity edema.  Extremities warm.    Abdomen: no tenderness, no masses palpated. No hepatosplenomegaly. Bowel sounds positive.  Musculoskeletal: no clubbing / cyanosis. No joint deformity upper and lower extremities.  Skin: no rashes, lesions, ulcers. No induration Neurologic: No apparent cranial nerve abnormality, moving extremities spontaneously, speech fluent.Marland Kitchen  Psychiatric: Normal judgment and insight. Alert and oriented x 3. Normal mood.   Labs on Admission: I have personally reviewed following labs and imaging studies  CBC: Recent Labs  Lab 04/17/23 1300  WBC 8.1  NEUTROABS 5.3  HGB 12.6*  HCT 40.8  MCV 88.9  PLT 265   Basic Metabolic Panel: Recent Labs  Lab 04/17/23 1300  NA 140  K 3.5  CL 106  CO2 22  GLUCOSE 107*  BUN 30*  CREATININE 1.90*  CALCIUM 9.4   GFR: Estimated Creatinine Clearance: 31.5 mL/min (A) (by C-G formula based on SCr of 1.9 mg/dL (H)). Liver Function Tests: Recent Labs  Lab 04/17/23 1300  AST 39  ALT 35  ALKPHOS 212*  BILITOT 1.8*  PROT 7.4   ALBUMIN 3.6   Urine analysis:    Component Value Date/Time   COLORURINE YELLOW 11/21/2022 2100   APPEARANCEUR CLEAR 11/21/2022 2100   APPEARANCEUR Cloudy (A) 06/16/2021 0936   LABSPEC 1.017 11/21/2022 2100   LABSPEC 1.018 04/23/2011 1044   PHURINE 5.0 11/21/2022 2100   GLUCOSEU NEGATIVE 11/21/2022 2100   GLUCOSEU Negative 04/23/2011 1044   HGBUR NEGATIVE 11/21/2022 2100   BILIRUBINUR NEGATIVE 11/21/2022 2100   BILIRUBINUR Negative 06/16/2021 0936   BILIRUBINUR Negative 04/23/2011 1044   KETONESUR NEGATIVE 11/21/2022 2100   PROTEINUR 100 (A) 11/21/2022 2100   NITRITE NEGATIVE 11/21/2022 2100   LEUKOCYTESUR NEGATIVE 11/21/2022 2100   LEUKOCYTESUR Trace 04/23/2011 1044    Radiological Exams on Admission: DG Chest 2 View Result Date: 04/17/2023 CLINICAL DATA:  Shortness of breath EXAM: CHEST - 2 VIEW COMPARISON:  03/31/2023 FINDINGS: Cardiomegaly. Aortic atherosclerosis. Pulmonary vascular congestion. Mild interstitial prominence. No focal airspace consolidation, pleural effusion, or pneumothorax. IMPRESSION: Cardiomegaly with pulmonary vascular congestion and mild interstitial prominence, suggesting mild edema. Electronically Signed   By: Duanne Guess D.O.   On: 04/17/2023 15:08    EKG: Independently reviewed.  Sinus rhythm, rate 90, QTc 496.  Frequent PVCs.  PR interval prolonged 272.  Assessment/Plan Principal Problem:   Acute on chronic combined systolic and diastolic CHF (congestive heart failure) (HCC) Active Problems:   Essential (primary) hypertension   CAD (coronary artery disease)   CKD stage 3b, GFR 30-44 ml/min (HCC)   First degree heart block   Chronic obstructive pulmonary disease (HCC)   Atrial fibrillation (HCC)  Assessment and Plan: * Acute on chronic combined systolic and diastolic CHF (congestive heart failure) (HCC) Presenting with dyspnea, trace bilateral lower extremity swelling.  BNP elevated at greater than than 4500, similar to prior  hospitalization for same.  Chest x-ray with pulmonary vascular congestion, mild edema.  Not hypoxic.  Reports compliance with Lasix 40 mg daily. -Last echo 11/2022 EF of 25%. -80 mg Lasix given in ED, continue 40 twice daily -Strict input output, daily weights, daily BMP    Atrial fibrillation (HCC) Rate controlled and on anticoagulation. -Resume carvedilol, Eliquis  Chronic obstructive pulmonary disease (HCC) Stable. -Resume home regimen  CKD stage 3b, GFR 30-44 ml/min (HCC) Creatinine 1.9.  At baseline.  Essential (primary) hypertension Elevated. -Resume carvedilol, losartan   DVT prophylaxis: Eliquis Code Status: Full Family Communication: None at  bedside Disposition Plan: ~ 2 days Consults called:  None  Admission status:  Obs tele    Author: Onnie Boer, MD 04/17/2023 7:14 PM  For on call review [email or web address].

## 2023-04-17 NOTE — Assessment & Plan Note (Signed)
 On admission. Creatinine 1.9.  At baseline.  04-18-2023 Scr 1.69 today. Holding ARB to give more room for diuresis.

## 2023-04-17 NOTE — Assessment & Plan Note (Signed)
 Presenting with dyspnea, trace bilateral lower extremity swelling.  BNP elevated at greater than than 4500, similar to prior hospitalization for same.  Chest x-ray with pulmonary vascular congestion, mild edema.  Not hypoxic.  Reports compliance with Lasix 40 mg daily. -Last echo 11/2022 EF of 25%. -80 mg Lasix given in ED, continue 40 twice daily -Strict input output, daily weights, daily BMP

## 2023-04-17 NOTE — Assessment & Plan Note (Signed)
 On admission. BP Elevated. -Resume carvedilol, losartan  04-18-2023 stop ARB to give Scr and BP more room for diuresis.  Apparently pt intolerant to Entresto. Cards consulted.

## 2023-04-17 NOTE — ED Provider Notes (Signed)
 [place] EMERGENCY DEPARTMENT AT [facility] Provider Note   CSN: [id number] Arrival date & time: 04/17/23  1215     History  Chief Complaint  Patient presents with   Shortness of Breath    Michael [name] is a 68 y.o. male with a history of MI, COPD, and CHF who presents the ED today for shortness of breath.  Patient endorses worsening shortness of breath for the past 3 days.  Denies any associated cough, fevers, or chest pain.  Was recently hospitalized on 2/28 for the same complaint.  Reports that symptoms improved until about 3 days ago.  States that he works persistent shortness of breath.  Is not worse with ambulation.  Denies any associated chest pain or fevers.  No abdominal swelling or lower extremity edema.  No additional complaints or concerns at this time.    Home Medications Prior to Admission medications   Medication Sig Start Date End Date Taking? Authorizing Provider  apixaban (ELIQUIS) 5 MG TABS tablet Take 1 tablet (5 mg total) by mouth 2 (two) times daily. 02/05/23 04/17/23 Yes Loetta Rough, MD  atorvastatin (LIPITOR) 40 MG tablet Take 1 tablet (40 mg total) by mouth every evening. Patient taking differently: Take 40 mg by mouth daily. 08/01/22 02/05/24 Yes Shahmehdi, Gemma Payor, MD  Budeson-Glycopyrrol-Formoterol (BREZTRI AEROSPHERE) 160-9-4.8 MCG/ACT AERO Inhale 2 puffs into the lungs 2 (two) times daily.   Yes [provider]  carvedilol (COREG) 6.25 MG tablet Take 1 tablet (6.25 mg total) by mouth 2 (two) times daily with a meal. 04/06/23  Yes Tat, Onalee Hua, MD  furosemide (LASIX) 40 MG tablet Take 1 tablet (40 mg total) by mouth 2 (two) times daily. 04/07/23  Yes Tat, Onalee Hua, MD  losartan (COZAAR) 25 MG tablet Take 25 mg by mouth daily. 12/23/22  Yes [provider]  nitroGLYCERIN (NITROSTAT) 0.4 MG SL tablet Place 1 tablet (0.4 mg total) under the tongue every 5 (five) minutes as needed for chest pain. 02/05/23  Yes Loetta Rough, MD  potassium  chloride SA (KLOR-CON M) 20 MEQ tablet Take 1 tablet (20 mEq total) by mouth daily. 07/26/22 02/05/24 Yes Shahmehdi, Gemma Payor, MD      Allergies    Patient has no known allergies.    Review of Systems   Review of Systems  Respiratory:  Positive for shortness of breath.   All other systems reviewed and are negative.   Physical Exam Updated Vital Signs BP (!) 175/106   Pulse 93   Temp 97.9 F (36.6 C) (Oral)   Resp (!) 25   Ht 6' (1.829 m)   Wt 59.9 kg   SpO2 96%   BMI 17.91 kg/m  Physical Exam Vitals and nursing note reviewed.  Constitutional:      Appearance: Normal appearance.  HENT:     Head: Normocephalic and atraumatic.     Mouth/Throat:     Mouth: Mucous membranes are moist.  Eyes:     Conjunctiva/sclera: Conjunctivae normal.     Pupils: Pupils are equal, round, and reactive to light.  Cardiovascular:     Rate and Rhythm: Normal rate and regular rhythm.     Pulses: Normal pulses.     Heart sounds: Normal heart sounds.  Pulmonary:     Effort: Pulmonary effort is normal.     Breath sounds: Normal breath sounds.     Comments: Shortness of breath with speaking.  Tachypneic with oxygen saturation to 96% on room air. Abdominal:  Palpations: Abdomen is soft.     Tenderness: There is no abdominal tenderness.  Skin:    General: Skin is warm and dry.     Findings: No rash.  Neurological:     General: No focal deficit present.     Mental Status: He is alert.  Psychiatric:        Mood and Affect: Mood normal.        Behavior: Behavior normal.    ED Results / Procedures / Treatments   Labs (all labs ordered are listed, but only abnormal results are displayed) Labs Reviewed  COMPREHENSIVE METABOLIC PANEL - Abnormal; Notable for the following components:      Result Value   Glucose, Bld 107 (*)    BUN 30 (*)    Creatinine, Ser 1.90 (*)    Alkaline Phosphatase 212 (*)    Total Bilirubin 1.8 (*)    GFR, Estimated 38 (*)    All other components within normal  limits  BRAIN NATRIURETIC PEPTIDE - Abnormal; Notable for the following components:   B Natriuretic Peptide >4,500.0 (*)    All other components within normal limits  CBC WITH DIFFERENTIAL/PLATELET - Abnormal; Notable for the following components:   Hemoglobin 12.6 (*)    RDW 15.8 (*)    All other components within normal limits  TROPONIN I (HIGH SENSITIVITY) - Abnormal; Notable for the following components:   Troponin I (High Sensitivity) 79 (*)    All other components within normal limits  TROPONIN I (HIGH SENSITIVITY) - Abnormal; Notable for the following components:   Troponin I (High Sensitivity) 76 (*)    All other components within normal limits  RESP PANEL BY RT-PCR (RSV, FLU A&B, COVID)  RVPGX2  BASIC METABOLIC PANEL    EKG EKG Interpretation Date/Time:  Monday April 17 2023 12:37:41 EDT Ventricular Rate:  90 PR Interval:  272 QRS Duration:  128 QT Interval:  405 QTC Calculation: 496 R Axis:   264  Text Interpretation: Sinus or ectopic atrial rhythm Multiform ventricular premature complexes Prolonged PR interval Nonspecific IVCD with LAD Anterior infarct, old Abnormal T, consider ischemia, lateral leads Confirmed by Vonita Moss [phone]) on 04/17/2023 3:49:58 PM  Radiology DG Chest 2 View Result Date: 04/17/2023 CLINICAL DATA:  Shortness of breath EXAM: CHEST - 2 VIEW COMPARISON:  03/31/2023 FINDINGS: Cardiomegaly. Aortic atherosclerosis. Pulmonary vascular congestion. Mild interstitial prominence. No focal airspace consolidation, pleural effusion, or pneumothorax. IMPRESSION: Cardiomegaly with pulmonary vascular congestion and mild interstitial prominence, suggesting mild edema. Electronically Signed   By: Duanne Guess D.O.   On: 04/17/2023 15:08    Procedures Procedures: not indicated.   Medications Ordered in ED Medications  albuterol (VENTOLIN HFA) 108 (90 Base) MCG/ACT inhaler 2 puff (has no administration in time range)  apixaban (ELIQUIS) tablet 5 mg  (has no administration in time range)  atorvastatin (LIPITOR) tablet 40 mg (has no administration in time range)  carvedilol (COREG) tablet 6.25 mg (has no administration in time range)  losartan (COZAAR) tablet 25 mg (has no administration in time range)  potassium chloride SA (KLOR-CON M) CR tablet 40 mEq (has no administration in time range)  furosemide (LASIX) injection 40 mg (has no administration in time range)  acetaminophen (TYLENOL) tablet 650 mg (has no administration in time range)    Or  acetaminophen (TYLENOL) suppository 650 mg (has no administration in time range)  polyethylene glycol (MIRALAX / GLYCOLAX) packet 17 g (has no administration in time range)  fluticasone furoate-vilanterol (BREO ELLIPTA) 200-25  MCG/ACT 1 puff (has no administration in time range)  umeclidinium bromide (INCRUSE ELLIPTA) 62.5 MCG/ACT 1 puff (has no administration in time range)  furosemide (LASIX) injection 80 mg (80 mg Intravenous Given 04/17/23 1434)    ED Course/ Medical Decision Making/ A&P                                 Medical Decision Making Amount and/or Complexity of Data Reviewed Labs: ordered. Radiology: ordered.  Risk Prescription drug management. Decision regarding hospitalization.   This patient presents to the ED for concern of shortness of breath, this involves an extensive number of treatment options, and is a complaint that carries with it a high risk of complications and morbidity.   Differential diagnosis includes: ACS, CHF exacerbation, COPD exacerbation, pneumonia, flu, COVID, RSV, other viral illness, etc.   Comorbidities  See HPI above   Additional History  Additional history obtained from previous hospitalization records   Cardiac Monitoring / EKG  The patient was maintained on a cardiac monitor.  I personally viewed and interpreted the cardiac monitored which showed: multiform PVCs,  with a heart rate of 90 bpm.   Lab Tests  I ordered and  personally interpreted labs.  The pertinent results include:   BNP > 4,500 CMP and CBC are within normal limits for patient Troponin of 79, delta of 76   Imaging Studies  I ordered imaging studies including CXR  I independently visualized and interpreted imaging which showed:  Cardiomegaly with pulmonary vascular congestion and mild interstitial prominence, suggesting mild edema I agree with the radiologist interpretation   Consultations  I requested consultation with Dr. Mariea Clonts with [facility],  and discussed lab and imaging findings as well as pertinent plan - they recommend: admission for further evaluation and symptom management.   Problem List / ED Course / Critical Interventions / Medication Management  Worsening shortness of breath for the past 3 days without associated fevers or chest pain. No JVD, abdominal swelling, or lower extremity edema. Patient is tachypneic with normal oxygen saturation on room air. I ordered medications including: Lasix for CHF  Reevaluation of the patient after these medicines showed that the patient improved I have reviewed the patients home medicines and have made adjustments as needed Patient staffed with my attending, Dr. Eloise Harman, who agrees patient would benefit from admission.   Social Determinants of Health  Tobacco use   Test / Admission - Considered  Patient is stable and safe for admission.        Final Clinical Impression(s) / ED Diagnoses Final diagnoses:  Acute on chronic congestive heart failure, unspecified heart failure type [facility])    Rx / DC Orders ED Discharge Orders     None         Maxwell Marion, PA-C 04/17/23 2002    Rondel Baton, MD 04/21/23 1146

## 2023-04-17 NOTE — ED Triage Notes (Signed)
 Pt arrived via POV from home c/o worsening SOB X 3 days. Pt denies cough.

## 2023-04-17 NOTE — Assessment & Plan Note (Signed)
 Rate controlled and on anticoagulation. -Resume carvedilol, Eliquis

## 2023-04-17 NOTE — Assessment & Plan Note (Signed)
 Stable. -Resume home regimen

## 2023-04-17 NOTE — Care Management Obs Status (Signed)
 MEDICARE OBSERVATION STATUS NOTIFICATION   Patient Details  Name: Michael [name] MRN: [id number] Date of Birth: 1955/09/17   Medicare Observation Status Notification Given:  Yes    Barron Alvine, RN 04/17/2023, 8:53 PM

## 2023-04-17 NOTE — Progress Notes (Signed)
   04/17/23 2054  TOC Brief Assessment  Insurance and Status Reviewed  Patient has primary care physician Yes  Home environment has been reviewed From home.  Prior level of function: Independent.  Prior/Current Home Services No current home services  Social Drivers of Health Review SDOH reviewed no interventions necessary  Readmission risk has been reviewed Yes  Transition of care needs no transition of care needs at this time   Transition of Care Department [facility]) has reviewed patient and no other TOC needs have been identified at this time. We will continue to monitor patient advancement through interdisciplinary progression rounds. If new patient transition needs arise, please place a TOC consult.

## 2023-04-18 ENCOUNTER — Encounter (HOSPITAL_COMMUNITY): Payer: Self-pay | Admitting: Internal Medicine

## 2023-04-18 DIAGNOSIS — N400 Enlarged prostate without lower urinary tract symptoms: Secondary | ICD-10-CM | POA: Diagnosis present

## 2023-04-18 DIAGNOSIS — J439 Emphysema, unspecified: Secondary | ICD-10-CM | POA: Diagnosis not present

## 2023-04-18 DIAGNOSIS — E876 Hypokalemia: Secondary | ICD-10-CM

## 2023-04-18 DIAGNOSIS — I480 Paroxysmal atrial fibrillation: Secondary | ICD-10-CM

## 2023-04-18 DIAGNOSIS — Z604 Social exclusion and rejection: Secondary | ICD-10-CM | POA: Diagnosis present

## 2023-04-18 DIAGNOSIS — Z9049 Acquired absence of other specified parts of digestive tract: Secondary | ICD-10-CM | POA: Diagnosis not present

## 2023-04-18 DIAGNOSIS — Z1152 Encounter for screening for COVID-19: Secondary | ICD-10-CM | POA: Diagnosis not present

## 2023-04-18 DIAGNOSIS — I5043 Acute on chronic combined systolic (congestive) and diastolic (congestive) heart failure: Secondary | ICD-10-CM | POA: Diagnosis present

## 2023-04-18 DIAGNOSIS — I130 Hypertensive heart and chronic kidney disease with heart failure and stage 1 through stage 4 chronic kidney disease, or unspecified chronic kidney disease: Secondary | ICD-10-CM | POA: Diagnosis present

## 2023-04-18 DIAGNOSIS — I493 Ventricular premature depolarization: Secondary | ICD-10-CM | POA: Diagnosis present

## 2023-04-18 DIAGNOSIS — Z7901 Long term (current) use of anticoagulants: Secondary | ICD-10-CM | POA: Diagnosis not present

## 2023-04-18 DIAGNOSIS — Z85828 Personal history of other malignant neoplasm of skin: Secondary | ICD-10-CM | POA: Diagnosis not present

## 2023-04-18 DIAGNOSIS — Z8589 Personal history of malignant neoplasm of other organs and systems: Secondary | ICD-10-CM | POA: Diagnosis not present

## 2023-04-18 DIAGNOSIS — Z79899 Other long term (current) drug therapy: Secondary | ICD-10-CM | POA: Diagnosis not present

## 2023-04-18 DIAGNOSIS — I5020 Unspecified systolic (congestive) heart failure: Secondary | ICD-10-CM | POA: Diagnosis not present

## 2023-04-18 DIAGNOSIS — I10 Essential (primary) hypertension: Secondary | ICD-10-CM | POA: Diagnosis not present

## 2023-04-18 DIAGNOSIS — I428 Other cardiomyopathies: Secondary | ICD-10-CM | POA: Diagnosis present

## 2023-04-18 DIAGNOSIS — Z87891 Personal history of nicotine dependence: Secondary | ICD-10-CM | POA: Diagnosis not present

## 2023-04-18 DIAGNOSIS — Z87440 Personal history of urinary (tract) infections: Secondary | ICD-10-CM | POA: Diagnosis not present

## 2023-04-18 DIAGNOSIS — E785 Hyperlipidemia, unspecified: Secondary | ICD-10-CM | POA: Diagnosis present

## 2023-04-18 DIAGNOSIS — J449 Chronic obstructive pulmonary disease, unspecified: Secondary | ICD-10-CM | POA: Diagnosis present

## 2023-04-18 DIAGNOSIS — I2510 Atherosclerotic heart disease of native coronary artery without angina pectoris: Secondary | ICD-10-CM | POA: Diagnosis present

## 2023-04-18 DIAGNOSIS — Z823 Family history of stroke: Secondary | ICD-10-CM | POA: Diagnosis not present

## 2023-04-18 DIAGNOSIS — I959 Hypotension, unspecified: Secondary | ICD-10-CM | POA: Diagnosis present

## 2023-04-18 DIAGNOSIS — I252 Old myocardial infarction: Secondary | ICD-10-CM | POA: Diagnosis not present

## 2023-04-18 DIAGNOSIS — N1832 Chronic kidney disease, stage 3b: Secondary | ICD-10-CM | POA: Diagnosis present

## 2023-04-18 DIAGNOSIS — Z91148 Patient's other noncompliance with medication regimen for other reason: Secondary | ICD-10-CM | POA: Diagnosis not present

## 2023-04-18 LAB — BASIC METABOLIC PANEL
Anion gap: 10 (ref 5–15)
BUN: 31 mg/dL — ABNORMAL HIGH (ref 8–23)
CO2: 21 mmol/L — ABNORMAL LOW (ref 22–32)
Calcium: 8.7 mg/dL — ABNORMAL LOW (ref 8.9–10.3)
Chloride: 109 mmol/L (ref 98–111)
Creatinine, Ser: 1.69 mg/dL — ABNORMAL HIGH (ref 0.61–1.24)
GFR, Estimated: 44 mL/min — ABNORMAL LOW (ref >60)
Glucose, Bld: 92 mg/dL (ref 70–99)
Potassium: 3.2 mmol/L — ABNORMAL LOW (ref 3.5–5.1)
Sodium: 140 mmol/L (ref 135–145)

## 2023-04-18 MED ORDER — FUROSEMIDE 10 MG/ML IJ SOLN
80.0000 mg | Freq: Two times a day (BID) | INTRAMUSCULAR | Status: DC
  Administered 2023-04-18 – 2023-04-19 (×2): 80 mg via INTRAVENOUS
  Filled 2023-04-18 (×2): qty 8

## 2023-04-18 MED ORDER — POTASSIUM CHLORIDE CRYS ER 20 MEQ PO TBCR
40.0000 meq | EXTENDED_RELEASE_TABLET | ORAL | Status: AC
  Administered 2023-04-18 (×2): 40 meq via ORAL
  Filled 2023-04-18 (×2): qty 2

## 2023-04-18 MED ORDER — HYDRALAZINE HCL 50 MG PO TABS
50.0000 mg | ORAL_TABLET | Freq: Three times a day (TID) | ORAL | Status: DC
  Administered 2023-04-18 – 2023-04-21 (×7): 50 mg via ORAL
  Filled 2023-04-18 (×10): qty 1

## 2023-04-18 MED ORDER — POTASSIUM CHLORIDE CRYS ER 20 MEQ PO TBCR
20.0000 meq | EXTENDED_RELEASE_TABLET | Freq: Two times a day (BID) | ORAL | Status: DC
  Administered 2023-04-18 – 2023-04-19 (×4): 20 meq via ORAL
  Filled 2023-04-18 (×5): qty 1

## 2023-04-18 NOTE — Subjective & Objective (Signed)
 Pt seen and examined. On RA. States he thinks he may have missed some meds this week. He can't be sure.  Drinks around 50-60 ounces of fluid per day.

## 2023-04-18 NOTE — Assessment & Plan Note (Signed)
 04-18-2023 stable.  On lipitor and coreg.

## 2023-04-18 NOTE — Progress Notes (Signed)
 Heart Failure Nurse Navigator Progress Note  PCP: Benita Stabile, MD PCP-Cardiologist: Nona Dell, MD Admission Diagnosis: Acute on chronic congestive heart failure, unspecified heart failure type [facility]) Admitted from: Home  Presentation:   Michael [name] presented with shortness of breath. Denied any associated cough, fever,chest pain, abdominal swelling or lower extremity edema. BNP > 4,500.  Chest x-ray cardiomegaly with pulmonary vascular congestion and mild interstitial prominence, suggesting mild edema.  ECHO/ LVEF: 25%  Clinical Course:  Past Medical History:  Diagnosis Date   Aortic atherosclerosis (HCC) 08/03/2021   Atrial fibrillation (HCC)    CAD (coronary artery disease) 08/03/2021   Cardiac catheterization September 2023 with RCA/RV marginal stenosis managed medically   Cardiomyopathy (HCC)    Carotid artery disease (HCC)    CKD (chronic kidney disease) stage 3, GFR 30-59 ml/min (HCC)    COPD (chronic obstructive pulmonary disease) (HCC)    Essential hypertension    Head and neck cancer (HCC) 2019   Right facial basal cell carcinoma s/p resection with right partial mastectomy and partal rhinectomy with skin graft (06/13/17)   Iron deficiency anemia    Mass of colon 08/06/2021   Formatting of this note might be different from the original. Last Assessment & Plan:  Formatting of this note might be different from the original.  Status post right hemicolectomy by Dr. Henreitta Leber today 7/12.    -Will check postop CBC and BMP     Urinary retention      Social History   Socioeconomic History   Marital status: Single    Spouse name: Not on file   Number of children: Not on file   Years of education: 12   Highest education level: 12th grade  Occupational History   Not on file  Tobacco Use   Smoking status: Former    Current packs/day: 0.00    Average packs/day: 0.3 packs/day for 40.0 years (10.0 ttl pk-yrs)    Types: Cigarettes    Start date: 07/02/1981    Quit  date: 07/02/2021    Years since quitting: 1.7    Passive exposure: Past   Smokeless tobacco: Never   Tobacco comments:    Quit on 06/09/21  Vaping Use   Vaping status: Never Used  Substance and Sexual Activity   Alcohol use: Not Currently   Drug use: Never   Sexual activity: Not Currently  Other Topics Concern   Not on file  Social History Narrative   Not on file   Social Drivers of Health   Financial Resource Strain: Low Risk  (12/26/2022)   Overall Financial Resource Strain (CARDIA)    Difficulty of Paying Living Expenses: Not hard at all  Food Insecurity: No Food Insecurity (04/17/2023)   Hunger Vital Sign    Worried About Running Out of Food in the Last Year: Never true    Ran Out of Food in the Last Year: Never true  Transportation Needs: No Transportation Needs (04/17/2023)   PRAPARE - Administrator, Civil Service (Medical): No    Lack of Transportation (Non-Medical): No  Physical Activity: Sufficiently Active (12/26/2022)   Exercise Vital Sign    Days of Exercise per Week: 5 days    Minutes of Exercise per Session: 50 min  Stress: No Stress Concern Present (12/26/2022)   [employer] of Occupational Health - Occupational Stress Questionnaire    Feeling of Stress : Only a little  Social Connections: Socially Isolated (04/17/2023)   Social Connection and Isolation Panel [NHANES]  Frequency of Communication with Friends and Family: More than three times a week    Frequency of Social Gatherings with Friends and Family: More than three times a week    Attends Religious Services: Never    Database administrator or Organizations: No    Attends Banker Meetings: Never    Marital Status: Widowed   Education Assessment and Provision:  Detailed education and instructions provided on heart failure disease management including the following:  Signs and symptoms of Heart Failure When to call the physician Importance of daily weights Low sodium  diet Fluid restriction Medication management Anticipated future follow-up appointments  Patient education given on each of the above topics.  Patient acknowledges understanding via teach back method and acceptance of all instructions.  Education Materials:  "Living Better With Heart Failure" Booklet, HF zone tool, & Daily Weight Tracker Tool.  Patient has scale at home: Yes Patient has pill box at home: Yes    High Risk Criteria for Readmission and/or Poor Patient Outcomes: Heart failure hospital admissions (last 6 months): 5 No Show rate: 9% Difficult social situation: High readmission rate Demonstrates medication adherence: Yes Primary Language: English Literacy level: Reading, Writing & Comprehension  Barriers of Care:   Daily weights-encouraged him to do these Diet & Fluid Restrictions-drinks daily large amounts of fluids Tobacco Cessation  Considerations/Referrals:   Referral made to Heart Failure Pharmacist Stewardship: Yes Referral made to Heart Failure CSW/NCM TOC: No Referral made to Heart & Vascular TOC clinic: Yes.  Items for Follow-up on DC/TOC: Daily weights Diet & Fluid Restrictions Medication adherence Tobacco cessation Continued Heart Failure Education   Roxy Horseman, RN, BSN [facility] Heart Failure Navigator Secure Chat Only

## 2023-04-18 NOTE — Assessment & Plan Note (Signed)
 04-18-2023 replete with po kcl.

## 2023-04-18 NOTE — Progress Notes (Signed)
 Patient's blood pressure was 105/61,Dr [name] notified,holding the Hydralazine at this time.Cardiology to see patient. Plan of care on going.Marland Kitchen

## 2023-04-18 NOTE — Hospital Course (Signed)
 HPI: Michael [name] is a 68 y.o. male with medical history significant for systolic and diastolic CHF, atrial fibrillation, COPD, hypertension, CKD 3, BPH. Patient presented to the ED with complaints of difficulty breathing over the past 3 days, reports difficulty breathing with exertion and at rest.  No chest pain.  Reports some mild lower extremity swelling.  No abdominal bloating.  Reports compliance with his medications which include Lasix 40 mg daily. No cough, no fevers no chills.  Significant Events: Admitted 04/17/2023 acute on chronic combined CHF exacerbation   Significant Labs: WBC 8.1, HgB 12.6, plt 265 Na 140, K 3.5, CO2 of 22, BUN 30, Scr 1.9, glu 107 BNP >4500  Significant Imaging Studies: CXR Cardiomegaly with pulmonary vascular congestion and mild interstitial prominence, suggesting mild edema.  Antibiotic Therapy: Anti-infectives (From admission, onward)    None       Procedures:   Consultants:

## 2023-04-18 NOTE — Progress Notes (Signed)
 PROGRESS NOTE    Michael [name]  ZOX:[id number] DOB: November 14, 1955 DOA: 04/17/2023 PCP: Benita Stabile, MD  Subjective: Pt seen and examined. On RA. States he thinks he may have missed some meds this week. He can't be sure.  Drinks around 50-60 ounces of fluid per day.   Hospital Course: HPI: Michael [name] is a 68 y.o. male with medical history significant for systolic and diastolic CHF, atrial fibrillation, COPD, hypertension, CKD 3, BPH. Patient presented to the ED with complaints of difficulty breathing over the past 3 days, reports difficulty breathing with exertion and at rest.  No chest pain.  Reports some mild lower extremity swelling.  No abdominal bloating.  Reports compliance with his medications which include Lasix 40 mg daily. No cough, no fevers no chills.  Significant Events: Admitted 04/17/2023 acute on chronic combined CHF exacerbation   Significant Labs: WBC 8.1, HgB 12.6, plt 265 Na 140, K 3.5, CO2 of 22, BUN 30, Scr 1.9, glu 107 BNP >4500  Significant Imaging Studies: CXR Cardiomegaly with pulmonary vascular congestion and mild interstitial prominence, suggesting mild edema.  Antibiotic Therapy: Anti-infectives (From admission, onward)    None       Procedures:   Consultants:     Assessment and Plan: * Acute on chronic combined systolic and diastolic CHF (congestive heart failure) (HCC) On admission. Presenting with dyspnea, trace bilateral lower extremity swelling.  BNP elevated at greater than than 4500, similar to prior hospitalization for same.  Chest x-ray with pulmonary vascular congestion, mild edema.  Not hypoxic.  Reports compliance with Lasix 40 mg daily. -Last echo 11/2022 EF of 25%. -80 mg Lasix given in ED, continue 40 twice daily -Strict input output, daily weights, daily BMP  04-18-2023 cards consult. Check ReDS. Update echo.    Hypokalemia 04-18-2023 replete with po kcl.  Atrial fibrillation (HCC) On admission. Rate  controlled and on anticoagulation. -Resume carvedilol, Eliquis  04-18-2023 stable. On coreg and eliquis.  Chronic obstructive pulmonary disease (HCC) On admission. Stable. -Resume home regimen  04-18-2023 stable on home inhalers.  CKD stage 3b, GFR 30-44 ml/min (HCC) On admission. Creatinine 1.9.  At baseline.  04-18-2023 Scr 1.69 today. Holding ARB to give more room for diuresis.  CAD (coronary artery disease) 04-18-2023 stable.  On lipitor and coreg.  Essential (primary) hypertension On admission. BP Elevated. -Resume carvedilol, losartan  04-18-2023 stop ARB to give Scr and BP more room for diuresis.  Apparently pt intolerant to Entresto. Cards consulted.  DVT prophylaxis: apixaban (ELIQUIS) tablet 2.5 mg Start: 04/17/23 2200 apixaban (ELIQUIS) tablet 2.5 mg     Code Status: Full Code Family Communication: no family at bedside. He is decisional. Disposition Plan: return home Reason for continuing need for hospitalization: cards consulted. On IV lasix.  Objective: Vitals:   04/18/23 0134 04/18/23 0201 04/18/23 0632 04/18/23 0900  BP: (!) 140/101 (!) 183/110  (!) 145/104  Pulse:    84  Resp:    17  Temp:    97.9 F (36.6 C)  TempSrc:    Oral  SpO2:      Weight:   61 kg   Height:        Intake/Output Summary (Last 24 hours) at 04/18/2023 1144 Last data filed at 04/18/2023 0900 Gross per 24 hour  Intake 300 ml  Output 200 ml  Net 100 ml   Filed Weights   04/17/23 1227 04/18/23 0632  Weight: 59.9 kg 61 kg    Examination:  Physical Exam Vitals and  nursing note reviewed.  Constitutional:      General: He is not in acute distress.    Appearance: He is not toxic-appearing or diaphoretic.  HENT:     Head:     Comments: Obvious prior facial surgery Cardiovascular:     Rate and Rhythm: Normal rate and regular rhythm.     Pulses: Normal pulses.  Pulmonary:     Effort: Pulmonary effort is normal. No respiratory distress.     Breath sounds: Normal breath  sounds. No wheezing or rales.  Abdominal:     General: Abdomen is flat. Bowel sounds are normal. There is no distension.     Palpations: Abdomen is soft.     Tenderness: There is no abdominal tenderness.  Musculoskeletal:     Comments: Trace pretibial edema. Pt states that this level of edema in his legs is normal or better than average for him.  Skin:    General: Skin is warm and dry.     Capillary Refill: Capillary refill takes less than 2 seconds.     Comments: Extremities are warm. No cyanosis.  Neurological:     Mental Status: He is alert and oriented to person, place, and time.     Data Reviewed: I have personally reviewed following labs and imaging studies  CBC: Recent Labs  Lab 04/17/23 1300  WBC 8.1  NEUTROABS 5.3  HGB 12.6*  HCT 40.8  MCV 88.9  PLT 265   Basic Metabolic Panel: Recent Labs  Lab 04/17/23 1300 04/18/23 0454  NA 140 140  K 3.5 3.2*  CL 106 109  CO2 22 21*  GLUCOSE 107* 92  BUN 30* 31*  CREATININE 1.90* 1.69*  CALCIUM 9.4 8.7*   GFR: Estimated Creatinine Clearance: 36.1 mL/min (A) (by C-G formula based on SCr of 1.69 mg/dL (H)). Liver Function Tests: Recent Labs  Lab 04/17/23 1300  AST 39  ALT 35  ALKPHOS 212*  BILITOT 1.8*  PROT 7.4  ALBUMIN 3.6   BNP (last 3 results) Recent Labs    02/25/23 0603 03/31/23 1758 04/17/23 1300  BNP 1,705.0* >4,500.0* >4,500.0*    Recent Results (from the past 240 hours)  Resp panel by RT-PCR (RSV, Flu A&B, Covid) Anterior Nasal Swab     Status: None   Collection Time: 04/17/23  1:00 PM   Specimen: Anterior Nasal Swab  Result Value Ref Range Status   SARS Coronavirus 2 by RT PCR NEGATIVE NEGATIVE Final    Comment: (NOTE) SARS-CoV-2 target nucleic acids are NOT DETECTED.  The SARS-CoV-2 RNA is generally detectable in upper respiratory specimens during the acute phase of infection. The lowest concentration of SARS-CoV-2 viral copies this assay can detect is 138 copies/mL. A negative result  does not preclude SARS-Cov-2 infection and should not be used as the sole basis for treatment or other patient management decisions. A negative result may occur with  improper specimen collection/handling, submission of specimen other than nasopharyngeal swab, presence of viral mutation(s) within the areas targeted by this assay, and inadequate number of viral copies(<138 copies/mL). A negative result must be combined with clinical observations, patient history, and epidemiological information. The expected result is Negative.  Fact Sheet for Patients:  BloggerCourse.com  Fact Sheet for Healthcare Providers:  SeriousBroker.it  This test is no t yet approved or cleared by the [place] FDA and  has been authorized for detection and/or diagnosis of SARS-CoV-2 by FDA under an Emergency Use Authorization (EUA). This EUA will remain  in effect (meaning this  test can be used) for the duration of the COVID-19 declaration under Section 564(b)(1) of the Act, 21 U.S.C.section 360bbb-3(b)(1), unless the authorization is terminated  or revoked sooner.       Influenza A by PCR NEGATIVE NEGATIVE Final   Influenza B by PCR NEGATIVE NEGATIVE Final    Comment: (NOTE) The Xpert Xpress SARS-CoV-2/FLU/RSV plus assay is intended as an aid in the diagnosis of influenza from Nasopharyngeal swab specimens and should not be used as a sole basis for treatment. Nasal washings and aspirates are unacceptable for Xpert Xpress SARS-CoV-2/FLU/RSV testing.  Fact Sheet for Patients: BloggerCourse.com  Fact Sheet for Healthcare Providers: SeriousBroker.it  This test is not yet approved or cleared by the [place] FDA and has been authorized for detection and/or diagnosis of SARS-CoV-2 by FDA under an Emergency Use Authorization (EUA). This EUA will remain in effect (meaning this test can be used) for  the duration of the COVID-19 declaration under Section 564(b)(1) of the Act, 21 U.S.C. section 360bbb-3(b)(1), unless the authorization is terminated or revoked.     Resp Syncytial Virus by PCR NEGATIVE NEGATIVE Final    Comment: (NOTE) Fact Sheet for Patients: BloggerCourse.com  Fact Sheet for Healthcare Providers: SeriousBroker.it  This test is not yet approved or cleared by the [place] FDA and has been authorized for detection and/or diagnosis of SARS-CoV-2 by FDA under an Emergency Use Authorization (EUA). This EUA will remain in effect (meaning this test can be used) for the duration of the COVID-19 declaration under Section 564(b)(1) of the Act, 21 U.S.C. section 360bbb-3(b)(1), unless the authorization is terminated or revoked.  Performed at [facility], [address] [place], [place] [address]      Radiology Studies: DG Chest 2 View Result Date: 04/17/2023 CLINICAL DATA:  Shortness of breath EXAM: CHEST - 2 VIEW COMPARISON:  03/31/2023 FINDINGS: Cardiomegaly. Aortic atherosclerosis. Pulmonary vascular congestion. Mild interstitial prominence. No focal airspace consolidation, pleural effusion, or pneumothorax. IMPRESSION: Cardiomegaly with pulmonary vascular congestion and mild interstitial prominence, suggesting mild edema. Electronically Signed   By: Duanne Guess D.O.   On: 04/17/2023 15:08    Scheduled Meds:  apixaban  2.5 mg Oral BID   atorvastatin  40 mg Oral QPM   carvedilol  6.25 mg Oral BID WC   furosemide  40 mg Intravenous Q12H   hydrALAZINE  50 mg Oral Q8H   potassium chloride  40 mEq Oral Q4H   Continuous Infusions:   LOS: 0 days   Time spent: 40 minutes  Carollee Herter, DO  [facility]  04/18/2023, 11:44 AM

## 2023-04-18 NOTE — Plan of Care (Signed)
   Problem: Education: Goal: Knowledge of General Education information will improve Description Including pain rating scale, medication(s)/side effects and non-pharmacologic comfort measures Outcome: Progressing   Problem: Education: Goal: Knowledge of General Education information will improve Description Including pain rating scale, medication(s)/side effects and non-pharmacologic comfort measures Outcome: Progressing

## 2023-04-18 NOTE — Progress Notes (Signed)
 ReDs Clip showed low quality.Marland Kitchen

## 2023-04-18 NOTE — Consult Note (Addendum)
 Cardiology Consultation   Patient ID: Michael [name] MRN: [id number]; DOB: 07-24-1955  Admit date: 04/17/2023 Date of Consult: 04/18/2023  PCP:  Benita Stabile, MD   [facility] Providers Cardiologist:  Nona Dell, MD        Patient Profile:   Michael [name] is a 68 y.o. male with a hx of  HFrEF, CAD, HTN,  who is being seen 04/18/2023 for the evaluation of acute on chronic CHF at the request of Dr. [name].  History of Present Illness:   Michael [name] with a hx of chronic HFrEF (EF 30% in 10/2021, at 35-40% in 04/2022, 25-30% in 07/2022 and 25% in 11/2022), CAD (cath in 10/2021 showing mild to moderate obstructive CAD along mid-RCA and acute Mrg with medical management recommended), carotid artery stenosis (s/p right TCAR in 01/2021), iron deficiency anemia, HTN (complicated by orthostatic hypotension), HLD, COPD and Stage 3 CKD.he previously had worsening orthostasis with Entresto and had recurrent UTI's with SGLT2 inhibitors in the past.  This is his 13 admission in past year. Just discharged 04/06/23 for same. Discharge weight 132 lbs. He returns with increase dyspnea and edema.BNP>4500. ReDS 34.Says his weight was 134. Became acutely short of breath, only mild leg edema. Says he may have missed some meds.    Past Medical History:  Diagnosis Date   Aortic atherosclerosis (HCC) 08/03/2021   Atrial fibrillation (HCC)    CAD (coronary artery disease) 08/03/2021   Cardiac catheterization September 2023 with RCA/RV marginal stenosis managed medically   Cardiomyopathy (HCC)    Carotid artery disease (HCC)    CKD (chronic kidney disease) stage 3, GFR 30-59 ml/min (HCC)    COPD (chronic obstructive pulmonary disease) (HCC)    Essential hypertension    Head and neck cancer (HCC) 2019   Right facial basal cell carcinoma s/p resection with right partial mastectomy and partal rhinectomy with skin graft (06/13/17)   Iron deficiency anemia    Mass of colon 08/06/2021    Formatting of this note might be different from the original. Last Assessment & Plan:  Formatting of this note might be different from the original.  Status post right hemicolectomy by Dr. Henreitta Leber today 7/12.    -Will check postop CBC and BMP     Urinary retention     Past Surgical History:  Procedure Laterality Date   ANKLE CLOSED REDUCTION Right    open reduction   BASAL CELL CARCINOMA EXCISION  2019   at [facility]   BIOPSY  07/19/2021   Procedure: BIOPSY;  Surgeon: Lanelle Bal, DO;  [facility]: [facility];  Service: Endoscopy;;   CATARACT EXTRACTION W/PHACO Left 01/23/2023   Procedure: CATARACT EXTRACTION PHACO AND INTRAOCULAR LENS PLACEMENT (IOC);  Surgeon: [name], MD;  Location: [facility];  Service: Ophthalmology;  Laterality: Left;  CDE: 23.26   COLONOSCOPY N/A 05/31/2017   Procedure: COLONOSCOPY;  Surgeon: Corbin Ade, MD;  Location: [facility];  Service: Endoscopy;  Laterality: N/A;  2:45pm   COLONOSCOPY WITH PROPOFOL N/A 07/19/2021   Procedure: COLONOSCOPY WITH PROPOFOL;  Surgeon: Lanelle Bal, DO;  Location: [facility];  Service: Endoscopy;  Laterality: N/A;  3:00pm, moved up to 9:00   CYSTOSCOPY N/A 10/29/2018   Procedure: CYSTOSCOPY, CLOT EVACUATION;  Surgeon: Rene Paci, MD;  Location: [facility];  Service: Urology;  Laterality: N/A;   PARTIAL COLECTOMY Right 08/11/2021   Procedure: PARTIAL COLECTOMY, OPEN RIGHT HEMICOLECTOMY;  Surgeon: Lucretia Roers, MD;  Location: [facility];  Service: General;  Laterality: Right;   POLYPECTOMY  05/31/2017   Procedure: POLYPECTOMY;  Surgeon: Corbin Ade, MD;  [facility]: [facility];  Service: Endoscopy;;   RIGHT/LEFT HEART CATH AND CORONARY ANGIOGRAPHY N/A 10/22/2021   Procedure: RIGHT/LEFT HEART CATH AND CORONARY ANGIOGRAPHY;  Surgeon: Orbie Pyo, MD;  Location: [facility];  Service: Cardiovascular;  Laterality: N/A;   TONSILLECTOMY     TRANSCAROTID ARTERY REVASCULARIZATION  Right 02/15/2021    Procedure: RIGHT TRANSCAROTID ARTERY REVASCULARIZATION;  Surgeon: Cephus Shelling, MD;  Location: [facility] OR;  Service: Vascular;  Laterality: Right;   ULTRASOUND GUIDANCE FOR VASCULAR ACCESS Left 02/15/2021   Procedure: ULTRASOUND GUIDANCE FOR VASCULAR ACCESS;  Surgeon: Cephus Shelling, MD;  Location: [facility] OR;  Service: Vascular;  Laterality: Left;   XI ROBOTIC ASSISTED SIMPLE PROSTATECTOMY N/A 10/29/2018   Procedure: XI ROBOTIC ASSISTED SIMPLE PROSTATECTOMY;  Surgeon: Malen Gauze, MD;  Location: [facility];  Service: Urology;  Laterality: N/A;     Home Medications:  Prior to Admission medications   Medication Sig Start Date End Date Taking? Authorizing Provider  apixaban (ELIQUIS) 5 MG TABS tablet Take 1 tablet (5 mg total) by mouth 2 (two) times daily. 02/05/23 04/17/23 Yes Loetta Rough, MD  atorvastatin (LIPITOR) 40 MG tablet Take 1 tablet (40 mg total) by mouth every evening. Patient taking differently: Take 40 mg by mouth daily. 08/01/22 02/05/24 Yes Shahmehdi, Gemma Payor, MD  Budeson-Glycopyrrol-Formoterol (BREZTRI AEROSPHERE) 160-9-4.8 MCG/ACT AERO Inhale 2 puffs into the lungs 2 (two) times daily.   Yes [provider]  carvedilol (COREG) 6.25 MG tablet Take 1 tablet (6.25 mg total) by mouth 2 (two) times daily with a meal. 04/06/23  Yes Tat, Onalee Hua, MD  furosemide (LASIX) 40 MG tablet Take 1 tablet (40 mg total) by mouth 2 (two) times daily. 04/07/23  Yes Tat, Onalee Hua, MD  losartan (COZAAR) 25 MG tablet Take 25 mg by mouth daily. 12/23/22  Yes [provider]  nitroGLYCERIN (NITROSTAT) 0.4 MG SL tablet Place 1 tablet (0.4 mg total) under the tongue every 5 (five) minutes as needed for chest pain. 02/05/23  Yes Loetta Rough, MD  potassium chloride SA (KLOR-CON M) 20 MEQ tablet Take 1 tablet (20 mEq total) by mouth daily. 07/26/22 02/05/24 Yes Shahmehdi, Gemma Payor, MD    Inpatient Medications: Scheduled Meds:  apixaban  2.5 mg Oral BID   atorvastatin  40 mg Oral QPM   carvedilol   6.25 mg Oral BID WC   furosemide  40 mg Intravenous Q12H   hydrALAZINE  50 mg Oral Q8H   potassium chloride  40 mEq Oral Q4H   Continuous Infusions:  PRN Meds: acetaminophen **OR** acetaminophen, albuterol, fluticasone furoate-vilanterol, hydrALAZINE, polyethylene glycol, umeclidinium bromide  Allergies:   No Known Allergies  Social History:   Social History   Socioeconomic History   Marital status: Single    Spouse name: Not on file   Number of children: Not on file   Years of education: 12   Highest education level: 12th grade  Occupational History   Not on file  Tobacco Use   Smoking status: Former    Current packs/day: 0.00    Average packs/day: 0.3 packs/day for 40.0 years (10.0 ttl pk-yrs)    Types: Cigarettes    Start date: 07/02/1981    Quit date: 07/02/2021    Years since quitting: 1.7    Passive exposure: Past   Smokeless tobacco: Never   Tobacco comments:  Quit on 06/09/21  Vaping Use   Vaping status: Never Used  Substance and Sexual Activity   Alcohol use: Not Currently   Drug use: Never   Sexual activity: Not Currently  Other Topics Concern   Not on file  Social History Narrative   Not on file   Social Drivers of Health   Financial Resource Strain: Low Risk  (12/26/2022)   Overall Financial Resource Strain (CARDIA)    Difficulty of Paying Living Expenses: Not hard at all  Food Insecurity: No Food Insecurity (04/17/2023)   Hunger Vital Sign    Worried About Running Out of Food in the Last Year: Never true    Ran Out of Food in the Last Year: Never true  Transportation Needs: No Transportation Needs (04/17/2023)   PRAPARE - Administrator, Civil Service (Medical): No    Lack of Transportation (Non-Medical): No  Physical Activity: Sufficiently Active (12/26/2022)   Exercise Vital Sign    Days of Exercise per Week: 5 days    Minutes of Exercise per Session: 50 min  Stress: No Stress Concern Present (12/26/2022)   [employer] of  Occupational Health - Occupational Stress Questionnaire    Feeling of Stress : Only a little  Social Connections: Socially Isolated (04/17/2023)   Social Connection and Isolation Panel [NHANES]    Frequency of Communication with Friends and Family: More than three times a week    Frequency of Social Gatherings with Friends and Family: More than three times a week    Attends Religious Services: Never    Database administrator or Organizations: No    Attends Banker Meetings: Never    Marital Status: Widowed  Intimate Partner Violence: Not At Risk (04/17/2023)   Humiliation, Afraid, Rape, and Kick questionnaire    Fear of Current or Ex-Partner: No    Emotionally Abused: No    Physically Abused: No    Sexually Abused: No    Family History:     Family History  Problem Relation Age of Onset   Stroke Father    Cirrhosis Mother    Colon cancer Neg Hx      ROS:  Please see the history of present illness.  Review of Systems  Cardiovascular:  Positive for dyspnea on exertion and leg swelling.  Respiratory:  Positive for shortness of breath.     All other ROS reviewed and negative.     Physical Exam/Data:   Vitals:   04/18/23 0134 04/18/23 0201 04/18/23 0632 04/18/23 0900  BP: (!) 140/101 (!) 183/110  (!) 145/104  Pulse:    84  Resp:    17  Temp:    97.9 F (36.6 C)  TempSrc:    Oral  SpO2:      Weight:   61 kg   Height:        Intake/Output Summary (Last 24 hours) at 04/18/2023 1207 Last data filed at 04/18/2023 0900 Gross per 24 hour  Intake 300 ml  Output 200 ml  Net 100 ml      04/18/2023    6:32 AM 04/17/2023   12:27 PM 04/06/2023    3:38 AM  Last 3 Weights  Weight (lbs) 134 lb 7.7 oz 132 lb 0.9 oz 132 lb 0.9 oz  Weight (kg) 61 kg 59.9 kg 59.9 kg     Body mass index is 18.24 kg/m.  General:  Thin, in no acute distress  HEENT: normal Neck: no JVD Vascular: No carotid  bruits; Distal pulses 2+ bilaterally Cardiac:  normal S1, S2; irreg irreg 2/6  systolic murmur LSB Lungs:  clear to auscultation bilaterally, no wheezing, rhonchi or rales  Abd: soft, nontender, no hepatomegaly  Ext: trace LLE edema Musculoskeletal:  No deformities, BUE and BLE strength normal and equal Skin: warm and dry  Neuro:  CNs 2-12 intact, no focal abnormalities noted Psych:  Normal affect   EKG:  The EKG was personally reviewed and demonstrates:  Afib with PVC's IVCD. Telemetry:  Telemetry was personally reviewed and demonstrates:  Afib with PVC's  Relevant CV Studies:  Limited Echocardiogram: 11/2022 IMPRESSIONS     1. No change in severely reduced EF from TTE done 08/16/22 Limited echo No  mural apical thrombus. Left ventricular ejection fraction, by estimation,  is 25%. The left ventricle demonstrates global hypokinesis. The left  ventricular internal cavity size was   severely dilated.   2. Right ventricular systolic function is mildly reduced. The right  ventricular size is mildly enlarged.   3. The mitral valve is abnormal. Mild mitral valve regurgitation.     Laboratory Data:  High Sensitivity Troponin:   Recent Labs  Lab 03/31/23 1758 03/31/23 1953 04/17/23 1300 04/17/23 1452  TROPONINIHS 65* 74* 79* 76*     Chemistry Recent Labs  Lab 04/17/23 1300 04/18/23 0454  NA 140 140  K 3.5 3.2*  CL 106 109  CO2 22 21*  GLUCOSE 107* 92  BUN 30* 31*  CREATININE 1.90* 1.69*  CALCIUM 9.4 8.7*  GFRNONAA 38* 44*  ANIONGAP 12 10    Recent Labs  Lab 04/17/23 1300  PROT 7.4  ALBUMIN 3.6  AST 39  ALT 35  ALKPHOS 212*  BILITOT 1.8*   Lipids No results for input(s): "CHOL", "TRIG", "HDL", "LABVLDL", "LDLCALC", "CHOLHDL" in the last 168 hours.  Hematology Recent Labs  Lab 04/17/23 1300  WBC 8.1  RBC 4.59  HGB 12.6*  HCT 40.8  MCV 88.9  MCH 27.5  MCHC 30.9  RDW 15.8*  PLT 265   Thyroid No results for input(s): "TSH", "FREET4" in the last 168 hours.  BNP Recent Labs  Lab 04/17/23 1300  BNP >4,500.0*    DDimer No  results for input(s): "DDIMER" in the last 168 hours.   Radiology/Studies:  DG Chest 2 View Result Date: 04/17/2023 CLINICAL DATA:  Shortness of breath EXAM: CHEST - 2 VIEW COMPARISON:  03/31/2023 FINDINGS: Cardiomegaly. Aortic atherosclerosis. Pulmonary vascular congestion. Mild interstitial prominence. No focal airspace consolidation, pleural effusion, or pneumothorax. IMPRESSION: Cardiomegaly with pulmonary vascular congestion and mild interstitial prominence, suggesting mild edema. Electronically Signed   By: Duanne Guess D.O.   On: 04/17/2023 15:08     Assessment and Plan:   Acute on chronic HFrEF  may have missed meds, EF 25% limited echo 11/2022 -hypotension/syncope with entresto, recurrent UTI's with SGLT2 inhibitors -discharge weight 04/06/23 132 lbs now 134 lbs -BNP>4500, ReDS-poor reading today while in room -symptoms improving but I/O's appear inaccurate  -received lasix 80 mg x1 yest and 40 mg IV bid now  HTN-BP running high on hydralazine and coreg. Losartan on hold.  CAD cath 10/2021 mild to mod nonobstructive CAD on ASA, coreg, atorvastatin, repatha  PAF on eliquis, coreg decreased to 6.25 mg bid due to bradycardia.  CKD stage 3 Crt 1.69, K 3.2  Carotid art stenosis TCAR 01/2021  COPD continued to smoke   Risk Assessment/Risk Scores:     New York Heart Association (NYHA) Functional Class NYHA Class IV  For questions or updates, please contact [facility] Please consult [email or web address] for contact info under    Signed, Jacolyn Reedy, PA-C  04/18/2023 12:07 PM  Patient seen and examined   I agree with findings as noted above by Leda Gauze Pt is a 68 yo with hx of HFrEF ,  LVEF 25% in Oct 2024  Also hx of mild/mod CAD, PAF, CKDand HTN He has had multiple  admissions for SOB    Presents wit increased SOB.  Note that he may have missed some of his meds  On admit BNP greater than 4500, ReDS measurement is 34    Currently receiving IV lasix 40 mg bid    On exam PT appears comfortable in bed   Neck;  JVP is normal    Lungs   Irreg irreg   Ext without edema  As noted above   continue with IV lasix   BMET and  BNP in am  Strict I/O  Replete K as needed  Follow BP as diurese   Note losartan on old     Note carvedilol decreased  Will need to follow afib on tele   Need to assess social needs, now to increase compliance  Dietrich Pates MD

## 2023-04-18 NOTE — Progress Notes (Signed)
   04/18/23 0900  Vitals  Temp 97.9 F (36.6 C)  Temp Source Oral  BP (!) 145/104  BP Location Right Arm  BP Method Automatic  Patient Position (if appropriate) Sitting  Pulse Rate 84  Pulse Rate Source Dinamap  Resp 17  MEWS COLOR  MEWS Score Color Green  MEWS Score  MEWS Temp 0  MEWS Systolic 0  MEWS Pulse 0  MEWS RR 0  MEWS LOC 0  MEWS Score 0  Provider Notification  Provider Name/Title Dr [name]  Date Provider Notified 04/18/23  Time Provider Notified [phone]  Method of Notification Page  Notification Reason Critical Result (b/p 145/104)  Test performed and critical result vital signs  Date Critical Result Received 04/18/23  Time Critical Result Received 0900  Provider response No new orders  Date of Provider Response 04/18/23

## 2023-04-19 ENCOUNTER — Encounter: Admitting: Family

## 2023-04-19 DIAGNOSIS — I5020 Unspecified systolic (congestive) heart failure: Secondary | ICD-10-CM

## 2023-04-19 DIAGNOSIS — I5043 Acute on chronic combined systolic (congestive) and diastolic (congestive) heart failure: Secondary | ICD-10-CM | POA: Diagnosis not present

## 2023-04-19 LAB — BASIC METABOLIC PANEL
Anion gap: 8 (ref 5–15)
BUN: 31 mg/dL — ABNORMAL HIGH (ref 8–23)
CO2: 25 mmol/L (ref 22–32)
Calcium: 8.9 mg/dL (ref 8.9–10.3)
Chloride: 110 mmol/L (ref 98–111)
Creatinine, Ser: 1.83 mg/dL — ABNORMAL HIGH (ref 0.61–1.24)
GFR, Estimated: 40 mL/min — ABNORMAL LOW (ref >60)
Glucose, Bld: 109 mg/dL — ABNORMAL HIGH (ref 70–99)
Potassium: 3.8 mmol/L (ref 3.5–5.1)
Sodium: 143 mmol/L (ref 135–145)

## 2023-04-19 LAB — MAGNESIUM: Magnesium: 2 mg/dL (ref 1.7–2.4)

## 2023-04-19 MED ORDER — ISOSORBIDE MONONITRATE ER 30 MG PO TB24
30.0000 mg | ORAL_TABLET | Freq: Every evening | ORAL | Status: DC
  Administered 2023-04-19 – 2023-04-20 (×2): 30 mg via ORAL
  Filled 2023-04-19 (×2): qty 1

## 2023-04-19 MED ORDER — FUROSEMIDE 10 MG/ML IJ SOLN
60.0000 mg | Freq: Two times a day (BID) | INTRAMUSCULAR | Status: DC
  Administered 2023-04-19 – 2023-04-20 (×3): 60 mg via INTRAVENOUS
  Filled 2023-04-19 (×3): qty 6

## 2023-04-19 NOTE — Progress Notes (Addendum)
 Progress Note  Patient Name: Michael [name] Date of Encounter: 04/19/2023  Primary Cardiologist: Nona Dell, MD  Interval Summary   Chart reviewed including cardiology consultation by Dr. Tenny Craw yesterday.  Initial ReDS clip measurement was 34, however repeated twice today at 45 and 49.  He states that he feels somewhat better, intermittent cough.  Chest x-ray from March 17 demonstrated vascular congestion and mild interstitial edema.  Weight is stable.  Urine output incomplete on IV Lasix.  Vital Signs    Vitals:   04/18/23 2019 04/19/23 0425 04/19/23 0600 04/19/23 0918  BP: 121/68 (!) 136/94 110/80 (!) 145/88  Pulse: 71 64  71  Resp: (!) 21 20  19   Temp: 98 F (36.7 C) 97.9 F (36.6 C)  98.9 F (37.2 C)  TempSrc: Oral Oral  Oral  SpO2: 99% 98%  97%  Weight:  60.1 kg    Height:        Intake/Output Summary (Last 24 hours) at 04/19/2023 1059 Last data filed at 04/19/2023 0900 Gross per 24 hour  Intake 300 ml  Output --  Net 300 ml   Filed Weights   04/17/23 1227 04/18/23 0632 04/19/23 0425  Weight: 59.9 kg 61 kg 60.1 kg    Physical Exam   GEN: Chronically ill-appearing, no acute distress.   Neck: No JVD. Cardiac: RRR, no murmur, rub, or gallop.  Respiratory: Nonlabored.  Decreased breath sounds. GI: Soft, nontender, bowel sounds present. MS: No edema.  ECG/Telemetry    Telemetry reviewed showing sinus rhythm.  Labs    Chemistry Recent Labs  Lab 04/17/23 1300 04/18/23 0454 04/19/23 0454  NA 140 140 143  K 3.5 3.2* 3.8  CL 106 109 110  CO2 22 21* 25  GLUCOSE 107* 92 109*  BUN 30* 31* 31*  CREATININE 1.90* 1.69* 1.83*  CALCIUM 9.4 8.7* 8.9  PROT 7.4  --   --   ALBUMIN 3.6  --   --   AST 39  --   --   ALT 35  --   --   ALKPHOS 212*  --   --   BILITOT 1.8*  --   --   GFRNONAA 38* 44* 40*  ANIONGAP 12 10 8     Hematology Recent Labs  Lab 04/17/23 1300  WBC 8.1  RBC 4.59  HGB 12.6*  HCT 40.8  MCV 88.9  MCH 27.5  MCHC 30.9  RDW  15.8*  PLT 265   Cardiac Enzymes Recent Labs  Lab 03/31/23 1758 03/31/23 1953 04/17/23 1300 04/17/23 1452  TROPONINIHS 65* 74* 79* 76*   Lipid Panel     Component Value Date/Time   CHOL 62 07/24/2022 1130   TRIG 60 07/24/2022 1130   HDL 32 (L) 07/24/2022 1130   CHOLHDL 1.9 07/24/2022 1130   VLDL 12 07/24/2022 1130   LDLCALC 18 07/24/2022 1130    Cardiac Studies   Echocardiogram 11/05/2022:  1. No change in severely reduced EF from TTE done 08/16/22 Limited echo No  mural apical thrombus. Left ventricular ejection fraction, by estimation,  is 25%. The left ventricle demonstrates global hypokinesis. The left  ventricular internal cavity size was   severely dilated.   2. Right ventricular systolic function is mildly reduced. The right  ventricular size is mildly enlarged.   3. The mitral valve is abnormal. Mild mitral valve regurgitation.   Assessment & Plan   1.  HFrEF, acute on chronic.  LVEF approximately 25% by echocardiogram in October 2024.  He  has a nonischemic cardiomyopathy with mild to moderate underlying CAD by cardiac catheterization in September 2023.  Repeat presentation is complicated by social issues, some degree of medication noncompliance.  GDMT also limited by CKD stage IIIb, orthostatic intolerance to Entresto, and recurrent UTIs on SGLT2 inhibitors.  Does not appear substantially fluid overloaded on examination with stable weight, however ReDS clip measurements are elevated in the 40s today (question accuracy of initial measurement).  2.  CKD stage IIIb, creatinine 1.83 with GFR 40.  3.  Paroxysmal atrial fibrillation, on Eliquis for stroke prophylaxis.  Currently in sinus rhythm.  4.  Primary hypertension.  Losartan presently held.  Decrease Lasix to 60 mg IV twice daily.  Start Imdur 30 mg daily and continue hydralazine 50 mg every 8 hours.  Replete potassium.  Continue Coreg 6.25 mg twice daily and Eliquis 2.5 mg twice daily.  Also useful to update  echocardiogram to reevaluate LV and RV function as well as valvular status.  For questions or updates, please contact [facility] Please consult [email or web address] for contact info under   Signed, Nona Dell, MD  04/19/2023, 10:59 AM   Telemetry shows sinus rhythm

## 2023-04-19 NOTE — Progress Notes (Signed)
 PROGRESS NOTE    Michael [name]  YQM:[id number] DOB: 1956-01-28 DOA: 04/17/2023 PCP: Benita Stabile, MD   Brief Narrative:    Michael [name] is a 68 y.o. male with medical history significant for systolic and diastolic CHF, atrial fibrillation, COPD, hypertension, CKD 3, BPH. Patient presented to the ED with complaints of difficulty breathing over the past 3 days, reports difficulty breathing with exertion and at rest.   Assessment & Plan:   Principal Problem:   Acute on chronic combined systolic and diastolic CHF (congestive heart failure) (HCC) Active Problems:   Hypokalemia   Essential (primary) hypertension   CAD (coronary artery disease)   CKD stage 3b, GFR 30-44 ml/min (HCC)   Chronic obstructive pulmonary disease (HCC)   Atrial fibrillation (HCC)   Acute on chronic combined systolic (congestive) and diastolic (congestive) heart failure (HCC)  Assessment and Plan:  Acute on chronic combined systolic and diastolic CHF (congestive heart failure) (HCC) On admission. Presenting with dyspnea, trace bilateral lower extremity swelling.  BNP elevated at greater than than 4500, similar to prior hospitalization for same.  Chest x-ray with pulmonary vascular congestion, mild edema.  Not hypoxic.  Reports compliance with Lasix 40 mg daily. -Last echo 11/2022 EF of 25%. -80 mg Lasix given in ED, continue 40 twice daily -Strict input output, daily weights, daily BMP   04-18-2023 cards consult. Check ReDS. Update echo.  04-19-2023 appreciate cardiology input with adjustment to IV Lasix to 60 mg IV twice daily and start Imdur.  2D echocardiogram ordered and pending     Atrial fibrillation (HCC) On admission. Rate controlled and on anticoagulation. -Resume carvedilol, Eliquis   04-18-2023 stable. On coreg and eliquis.   Chronic obstructive pulmonary disease (HCC) On admission. Stable. -Resume home regimen   04-18-2023 stable on home inhalers.   CKD stage 3b, GFR 30-44 ml/min  (HCC) On admission. Creatinine 1.9.  At baseline.   04-18-2023 Scr 1.69 today. Holding ARB to give more room for diuresis.   CAD (coronary artery disease) 04-18-2023 stable.  On lipitor and coreg.   Essential (primary) hypertension On admission. BP Elevated. -Resume carvedilol, losartan   04-18-2023 stop ARB to give Scr and BP more room for diuresis.  Apparently pt intolerant to Entresto. Cards consulted.  04-19-2023 added Imdur per cardiology and continue hydralazine 50 mg every 8 hours  DVT prophylaxis: Apixaban Code Status: Full Family Communication: None at bedside Disposition Plan:  Status is: Inpatient Remains inpatient appropriate because: Need for ongoing IV medications.   Consultants:  Cardiology  Procedures:  None  Antimicrobials:  None   Subjective: Patient seen and evaluated today with no new acute complaints or concerns. No acute concerns or events noted overnight.  He states he is feeling better with less shortness of breath.  Objective: Vitals:   04/18/23 2019 04/19/23 0425 04/19/23 0600 04/19/23 0918  BP: 121/68 (!) 136/94 110/80 (!) 145/88  Pulse: 71 64  71  Resp: (!) 21 20  19   Temp: 98 F (36.7 C) 97.9 F (36.6 C)  98.9 F (37.2 C)  TempSrc: Oral Oral  Oral  SpO2: 99% 98%  97%  Weight:  60.1 kg    Height:        Intake/Output Summary (Last 24 hours) at 04/19/2023 1414 Last data filed at 04/19/2023 0900 Gross per 24 hour  Intake 300 ml  Output --  Net 300 ml   Filed Weights   04/17/23 1227 04/18/23 0632 04/19/23 0425  Weight: 59.9 kg 61 kg  60.1 kg    Examination:  General exam: Appears calm and comfortable  Respiratory system: Clear to auscultation. Respiratory effort normal. Cardiovascular system: S1 & S2 heard, RRR.  Gastrointestinal system: Abdomen is soft Central nervous system: Alert and awake Extremities: No edema Skin: No significant lesions noted Psychiatry: Flat affect.    Data Reviewed: I have personally reviewed  following labs and imaging studies  CBC: Recent Labs  Lab 04/17/23 1300  WBC 8.1  NEUTROABS 5.3  HGB 12.6*  HCT 40.8  MCV 88.9  PLT 265   Basic Metabolic Panel: Recent Labs  Lab 04/17/23 1300 04/18/23 0454 04/19/23 0454  NA 140 140 143  K 3.5 3.2* 3.8  CL 106 109 110  CO2 22 21* 25  GLUCOSE 107* 92 109*  BUN 30* 31* 31*  CREATININE 1.90* 1.69* 1.83*  CALCIUM 9.4 8.7* 8.9  MG  --   --  2.0   GFR: Estimated Creatinine Clearance: 32.8 mL/min (A) (by C-G formula based on SCr of 1.83 mg/dL (H)). Liver Function Tests: Recent Labs  Lab 04/17/23 1300  AST 39  ALT 35  ALKPHOS 212*  BILITOT 1.8*  PROT 7.4  ALBUMIN 3.6   No results for input(s): "LIPASE", "AMYLASE" in the last 168 hours. No results for input(s): "AMMONIA" in the last 168 hours. Coagulation Profile: No results for input(s): "INR", "PROTIME" in the last 168 hours. Cardiac Enzymes: No results for input(s): "CKTOTAL", "CKMB", "CKMBINDEX", "TROPONINI" in the last 168 hours. BNP (last 3 results) No results for input(s): "PROBNP" in the last 8760 hours. HbA1C: No results for input(s): "HGBA1C" in the last 72 hours. CBG: No results for input(s): "GLUCAP" in the last 168 hours. Lipid Profile: No results for input(s): "CHOL", "HDL", "LDLCALC", "TRIG", "CHOLHDL", "LDLDIRECT" in the last 72 hours. Thyroid Function Tests: No results for input(s): "TSH", "T4TOTAL", "FREET4", "T3FREE", "THYROIDAB" in the last 72 hours. Anemia Panel: No results for input(s): "VITAMINB12", "FOLATE", "FERRITIN", "TIBC", "IRON", "RETICCTPCT" in the last 72 hours. Sepsis Labs: No results for input(s): "PROCALCITON", "LATICACIDVEN" in the last 168 hours.  Recent Results (from the past 240 hours)  Resp panel by RT-PCR (RSV, Flu A&B, Covid) Anterior Nasal Swab     Status: None   Collection Time: 04/17/23  1:00 PM   Specimen: Anterior Nasal Swab  Result Value Ref Range Status   SARS Coronavirus 2 by RT PCR NEGATIVE NEGATIVE Final     Comment: (NOTE) SARS-CoV-2 target nucleic acids are NOT DETECTED.  The SARS-CoV-2 RNA is generally detectable in upper respiratory specimens during the acute phase of infection. The lowest concentration of SARS-CoV-2 viral copies this assay can detect is 138 copies/mL. A negative result does not preclude SARS-Cov-2 infection and should not be used as the sole basis for treatment or other patient management decisions. A negative result may occur with  improper specimen collection/handling, submission of specimen other than nasopharyngeal swab, presence of viral mutation(s) within the areas targeted by this assay, and inadequate number of viral copies(<138 copies/mL). A negative result must be combined with clinical observations, patient history, and epidemiological information. The expected result is Negative.  Fact Sheet for Patients:  BloggerCourse.com  Fact Sheet for Healthcare Providers:  SeriousBroker.it  This test is no t yet approved or cleared by the [place] FDA and  has been authorized for detection and/or diagnosis of SARS-CoV-2 by FDA under an Emergency Use Authorization (EUA). This EUA will remain  in effect (meaning this test can be used) for the duration of the  COVID-19 declaration under Section 564(b)(1) of the Act, 21 U.S.C.section 360bbb-3(b)(1), unless the authorization is terminated  or revoked sooner.       Influenza A by PCR NEGATIVE NEGATIVE Final   Influenza B by PCR NEGATIVE NEGATIVE Final    Comment: (NOTE) The Xpert Xpress SARS-CoV-2/FLU/RSV plus assay is intended as an aid in the diagnosis of influenza from Nasopharyngeal swab specimens and should not be used as a sole basis for treatment. Nasal washings and aspirates are unacceptable for Xpert Xpress SARS-CoV-2/FLU/RSV testing.  Fact Sheet for Patients: BloggerCourse.com  Fact Sheet for Healthcare  Providers: SeriousBroker.it  This test is not yet approved or cleared by the [place] FDA and has been authorized for detection and/or diagnosis of SARS-CoV-2 by FDA under an Emergency Use Authorization (EUA). This EUA will remain in effect (meaning this test can be used) for the duration of the COVID-19 declaration under Section 564(b)(1) of the Act, 21 U.S.C. section 360bbb-3(b)(1), unless the authorization is terminated or revoked.     Resp Syncytial Virus by PCR NEGATIVE NEGATIVE Final    Comment: (NOTE) Fact Sheet for Patients: BloggerCourse.com  Fact Sheet for Healthcare Providers: SeriousBroker.it  This test is not yet approved or cleared by the [place] FDA and has been authorized for detection and/or diagnosis of SARS-CoV-2 by FDA under an Emergency Use Authorization (EUA). This EUA will remain in effect (meaning this test can be used) for the duration of the COVID-19 declaration under Section 564(b)(1) of the Act, 21 U.S.C. section 360bbb-3(b)(1), unless the authorization is terminated or revoked.  Performed at [facility], [address], [place], [place] [address]          Radiology Studies: No results found.      Scheduled Meds:  apixaban  2.5 mg Oral BID   atorvastatin  40 mg Oral QPM   carvedilol  6.25 mg Oral BID WC   furosemide  80 mg Intravenous Q12H   hydrALAZINE  50 mg Oral Q8H   potassium chloride  20 mEq Oral BID     LOS: 1 day    Time spent: 35 minutes    Michael Holness Hoover Brunette, DO [facility]  If 7PM-7AM, please contact night-coverage [email or web address] 04/19/2023, 2:14 PM

## 2023-04-19 NOTE — Plan of Care (Signed)
   Problem: Education: Goal: Knowledge of General Education information will improve Description Including pain rating scale, medication(s)/side effects and non-pharmacologic comfort measures Outcome: Progressing   Problem: Education: Goal: Knowledge of General Education information will improve Description Including pain rating scale, medication(s)/side effects and non-pharmacologic comfort measures Outcome: Progressing

## 2023-04-19 NOTE — Progress Notes (Signed)
   04/19/23 0825  ReDS Vest / Clip  Station Marker C  Ruler Value 32  ReDS Value Range (!) > 40  ReDS Actual Value 45

## 2023-04-19 NOTE — TOC Initial Note (Signed)
 Transition of Care [facility]) - Initial/Assessment Note    Patient Details  Name: Michael [name] MRN: [id number] Date of Birth: [birth year]  [facility]) CM/SW Contact:    Villa Herb, LCSWA Phone Number: 04/19/2023, 9:19 AM  Clinical Narrative:                 Pt is high risk for readmission. Pt is well known to TOC from past hospital admissions. Pt lives with friends and is independent in completing his ADLs. Pt has transportation provided when needed and is able to get his medications. Pt has previously been provided with CHF resources, CSW to add again at this time for pt to review at D/C. TOC to follow.   Expected Discharge Plan: Home/Self Care Barriers to Discharge: Continued Medical Work up   Patient Goals and CMS Choice Patient states their goals for this hospitalization and ongoing recovery are:: return home CMS Medicare.gov Compare Post Acute Care list provided to:: Patient Choice offered to / list presented to : Patient      Expected Discharge Plan and Services In-house Referral: Clinical Social Work Discharge Planning Services: CM Consult   Living arrangements for the past 2 months: Single Family Home                                      Prior Living Arrangements/Services Living arrangements for the past 2 months: Single Family Home Lives with:: Friends Patient language and need for interpreter reviewed:: Yes Do you feel safe going back to the place where you live?: Yes      Need for Family Participation in Patient Care: Yes (Comment) Care giver support system in place?: Yes (comment)   Criminal Activity/Legal Involvement Pertinent to Current Situation/Hospitalization: No - Comment as needed  Activities of Daily Living   ADL Screening (condition at time of admission) Independently performs ADLs?: Yes (appropriate for developmental age) Is the patient deaf or have difficulty hearing?: No Does the patient have difficulty seeing, even  when wearing glasses/contacts?: No Does the patient have difficulty concentrating, remembering, or making decisions?: No  Permission Sought/Granted                  Emotional Assessment Appearance:: Appears stated age Attitude/Demeanor/Rapport: Engaged Affect (typically observed): Accepting Orientation: : Oriented to Self, Oriented to Place, Oriented to  Time, Oriented to Situation Alcohol / Substance Use: Not Applicable Psych Involvement: No (comment)  Admission diagnosis:  Acute on chronic combined systolic (congestive) and diastolic (congestive) heart failure (HCC) [I50.43] Acute on chronic congestive heart failure, unspecified heart failure type [facility]) [I50.9] Patient Active Problem List   Diagnosis Date Noted   Acute on chronic combined systolic (congestive) and diastolic (congestive) heart failure (HCC) 04/18/2023   Atrial fibrillation (HCC) 02/05/2023   Body mass index (BMI) 27.0-27.9, adult 12/01/2022   H/O prostatectomy 12/01/2022   Malnutrition of moderate degree 11/24/2022   Chronic obstructive pulmonary disease (HCC) 11/22/2022   Chronic diastolic CHF (congestive heart failure) (HCC) 11/22/2022   Calculus of gallbladder without cholecystitis without obstruction 08/10/2022   Acute on chronic combined systolic and diastolic CHF (congestive heart failure) (HCC) 07/24/2022   Nicotine dependence, cigarettes, uncomplicated 05/17/2022   Noncompliance with treatment plan 05/17/2022   Orthostatic hypotension 05/17/2022   Vitamin D deficiency 05/17/2022   Elevated brain natriuretic peptide (BNP) level 04/29/2022   Solitary pulmonary nodule 04/26/2022   First degree heart block  04/12/2022   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   COPD (chronic obstructive pulmonary disease) (HCC) 11/03/2021   Aneurysm of infrarenal abdominal aorta (HCC) 11/03/2021   Functional gait abnormality 11/03/2021   Mixed hyperlipidemia 11/03/2021   Moderate protein-calorie malnutrition (HCC)  11/03/2021   Physical deconditioning 11/03/2021   Proteinuria 11/03/2021   Atrophy of pancreas 08/11/2021   CAD (coronary artery disease) 08/03/2021   CKD stage 3b, GFR 30-44 ml/min (HCC) 08/03/2021   Hardening of the aorta (main artery of the heart) (HCC) 08/03/2021   History of adenomatous polyp of colon 06/16/2021   Carotid stenosis, right 02/15/2021   Occlusion and stenosis of right carotid artery 02/15/2021   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 12/06/2019   Incisional hernia 11/15/2019   Benign prostatic hyperplasia with urinary obstruction 10/29/2018   Chronic combined systolic and diastolic CHF (congestive heart failure) (HCC) 05/09/2018   Incomplete bladder emptying 07/05/2017   Disorder of carotid artery (HCC) 06/25/2017   Orthostatic syncope    Syncope due to orthostatic hypotension 06/24/2017   Cardiac murmur 06/24/2017   Prolonged QT interval 06/24/2017   Basal cell carcinoma of eyelid 05/15/2017   Basal cell carcinoma (BCC) of nostril 05/12/2017   Skin lesion of face 05/03/2017   Other specified disorders of nose and nasal sinuses 05/03/2017   Iron deficiency anemia 04/04/2017   Essential (primary) hypertension 04/02/2017   Anemia of chronic disease 04/02/2017   Tobacco abuse 04/02/2017   Hypokalemia 04/02/2017   Nasal lesion    PCP:  Benita Stabile, MD Pharmacy:   [facility] [place], [place] - [address] [place] [address] Phone: [phone] Fax: [phone]     Social Drivers of Health (SDOH) Social History: SDOH Screenings   Food Insecurity: No Food Insecurity (04/17/2023)  Housing: Low Risk  (04/17/2023)  Recent Concern: Housing - High Risk (03/31/2023)  Transportation Needs: No Transportation Needs (04/17/2023)  Utilities: Not At Risk (04/17/2023)  Alcohol Screen: Low Risk  (12/26/2022)  Depression (PHQ2-9): Low Risk  (12/26/2022)  Financial Resource Strain: Low Risk  (12/26/2022)  Physical Activity:  Sufficiently Active (12/26/2022)  Social Connections: Socially Isolated (04/17/2023)  Stress: No Stress Concern Present (12/26/2022)  Tobacco Use: Medium Risk (04/17/2023)  Health Literacy: Adequate Health Literacy (12/26/2022)   SDOH Interventions:     Readmission Risk Interventions    04/19/2023    9:18 AM 04/02/2023    4:13 PM 11/22/2022    1:58 PM  Readmission Risk Prevention Plan  Transportation Screening Complete Complete Complete  Medication Review Oceanographer) Complete Complete Complete  PCP or Specialist appointment within 3-5 days of discharge  Complete   HRI or Home Care Consult Complete Complete Complete  SW Recovery Care/Counseling Consult Complete Complete Complete  Palliative Care Screening Not Applicable Not Applicable Not Applicable  Skilled Nursing Facility Not Applicable Not Applicable Not Applicable

## 2023-04-19 NOTE — Progress Notes (Signed)
 Nurse at bedside,patient alert to self,and place,confused to time,oriented to situation.No c/o pain or discomfort noted . Plan of care on going.

## 2023-04-20 ENCOUNTER — Inpatient Hospital Stay (HOSPITAL_COMMUNITY)

## 2023-04-20 DIAGNOSIS — I5043 Acute on chronic combined systolic (congestive) and diastolic (congestive) heart failure: Secondary | ICD-10-CM | POA: Diagnosis not present

## 2023-04-20 DIAGNOSIS — I5020 Unspecified systolic (congestive) heart failure: Secondary | ICD-10-CM | POA: Diagnosis not present

## 2023-04-20 LAB — ECHOCARDIOGRAM COMPLETE
AR max vel: 2.44 cm2
AV Area VTI: 2.32 cm2
AV Area mean vel: 2.32 cm2
AV Mean grad: 3.5 mmHg
AV Peak grad: 7.1 mmHg
Ao pk vel: 1.34 m/s
Area-P 1/2: 3.06 cm2
Calc EF: 24.3 %
Height: 72 in
MV VTI: 1.94 cm2
S' Lateral: 5.3 cm
Single Plane A2C EF: 27.5 %
Single Plane A4C EF: 19.1 %
Weight: 2012.8 [oz_av]

## 2023-04-20 LAB — BASIC METABOLIC PANEL
Anion gap: 9 (ref 5–15)
BUN: 30 mg/dL — ABNORMAL HIGH (ref 8–23)
CO2: 26 mmol/L (ref 22–32)
Calcium: 8.8 mg/dL — ABNORMAL LOW (ref 8.9–10.3)
Chloride: 105 mmol/L (ref 98–111)
Creatinine, Ser: 1.8 mg/dL — ABNORMAL HIGH (ref 0.61–1.24)
GFR, Estimated: 40 mL/min — ABNORMAL LOW (ref >60)
Glucose, Bld: 115 mg/dL — ABNORMAL HIGH (ref 70–99)
Potassium: 3.2 mmol/L — ABNORMAL LOW (ref 3.5–5.1)
Sodium: 140 mmol/L (ref 135–145)

## 2023-04-20 LAB — MAGNESIUM: Magnesium: 1.8 mg/dL (ref 1.7–2.4)

## 2023-04-20 MED ORDER — POTASSIUM CHLORIDE CRYS ER 20 MEQ PO TBCR
40.0000 meq | EXTENDED_RELEASE_TABLET | Freq: Two times a day (BID) | ORAL | Status: DC
  Administered 2023-04-20 (×2): 40 meq via ORAL
  Filled 2023-04-20 (×2): qty 2

## 2023-04-20 NOTE — Plan of Care (Signed)
?  Problem: Health Behavior/Discharge Planning: ?Goal: Ability to manage health-related needs will improve ?Outcome: Progressing ?  ?Problem: Clinical Measurements: ?Goal: Ability to maintain clinical measurements within normal limits will improve ?Outcome: Progressing ?Goal: Will remain free from infection ?Outcome: Progressing ?Goal: Diagnostic test results will improve ?Outcome: Progressing ?  ?Problem: Nutrition: ?Goal: Adequate nutrition will be maintained ?Outcome: Progressing ?  ?Problem: Coping: ?Goal: Level of anxiety will decrease ?Outcome: Progressing ?  ?

## 2023-04-20 NOTE — Progress Notes (Signed)
 Progress Note  Patient Name: Michael [name] Date of Encounter: 04/20/2023  Primary Cardiologist: Nona Dell, MD  Interval Summary   Chart reviewed.  Patient states that he feels better, breathing comfortably at this time, no chest pain.  ReDS clip reported at 49 today, although clinically does not appear fluid overloaded.  Urine output incomplete.  Creatinine relatively stable.  Vital Signs    Vitals:   04/20/23 0503 04/20/23 0550 04/20/23 0859 04/20/23 1354  BP: 120/86  124/78 (!) 150/93  Pulse:   66 (!) 59  Resp: 16  19   Temp: 98 F (36.7 C)  (!) 97.4 F (36.3 C) 97.8 F (36.6 C)  TempSrc: Oral  Oral Oral  SpO2: 97%  99% 100%  Weight:  57.1 kg    Height:        Intake/Output Summary (Last 24 hours) at 04/20/2023 1440 Last data filed at 04/20/2023 0830 Gross per 24 hour  Intake 1020 ml  Output 3 ml  Net 1017 ml   Filed Weights   04/18/23 0632 04/19/23 0425 04/20/23 0550  Weight: 61 kg 60.1 kg 57.1 kg    Physical Exam   GEN: Chronically ill-appearing, no acute distress.   Neck: No JVD. Cardiac: RRR, no murmur, rub, or gallop.  Respiratory: Nonlabored.  Decreased breath sounds. GI: Soft, nontender, bowel sounds present. MS: No edema.  ECG/Telemetry    Telemetry reviewed showing sinus rhythm.  Labs    Chemistry Recent Labs  Lab 04/17/23 1300 04/18/23 0454 04/19/23 0454 04/20/23 0503  NA 140 140 143 140  K 3.5 3.2* 3.8 3.2*  CL 106 109 110 105  CO2 22 21* 25 26  GLUCOSE 107* 92 109* 115*  BUN 30* 31* 31* 30*  CREATININE 1.90* 1.69* 1.83* 1.80*  CALCIUM 9.4 8.7* 8.9 8.8*  PROT 7.4  --   --   --   ALBUMIN 3.6  --   --   --   AST 39  --   --   --   ALT 35  --   --   --   ALKPHOS 212*  --   --   --   BILITOT 1.8*  --   --   --   GFRNONAA 38* 44* 40* 40*  ANIONGAP 12 10 8 9     Hematology Recent Labs  Lab 04/17/23 1300  WBC 8.1  RBC 4.59  HGB 12.6*  HCT 40.8  MCV 88.9  MCH 27.5  MCHC 30.9  RDW 15.8*  PLT 265   Cardiac  Enzymes Recent Labs  Lab 03/31/23 1758 03/31/23 1953 04/17/23 1300 04/17/23 1452  TROPONINIHS 65* 74* 79* 76*   Lipid Panel     Component Value Date/Time   CHOL 62 07/24/2022 1130   TRIG 60 07/24/2022 1130   HDL 32 (L) 07/24/2022 1130   CHOLHDL 1.9 07/24/2022 1130   VLDL 12 07/24/2022 1130   LDLCALC 18 07/24/2022 1130    Cardiac Studies   Echocardiogram 04/20/2023:  1. Left ventricular ejection fraction, by estimation, is 20 to 25%. The  left ventricle has severely decreased function. The left ventricle  demonstrates regional wall motion abnormalities (see scoring  diagram/findings for description). The left  ventricular internal cavity size was mildly to moderately dilated. There  is mild concentric left ventricular hypertrophy. Left ventricular  diastolic parameters are consistent with Grade II diastolic dysfunction  (pseudonormalization).   2. Right ventricular systolic function is mildly reduced. The right  ventricular size is normal. Tricuspid regurgitation  signal is inadequate  for assessing PA pressure.   3. Left atrial size was mildly dilated.   4. Right atrial size was mildly dilated.   5. The mitral valve is degenerative. Mild mitral valve regurgitation.   6. The aortic valve is tricuspid. Aortic valve regurgitation is not  visualized. No aortic stenosis is present. Aortic valve mean gradient  measures 3.5 mmHg.   7. The inferior vena cava is normal in size with greater than 50%  respiratory variability, suggesting right atrial pressure of 3 mmHg.   Assessment & Plan   1.  HFrEF, acute on chronic.  Follow-up echocardiogram this admission shows LVEF 20 to 25% range with moderate diastolic dysfunction.  He has a nonischemic cardiomyopathy with mild to moderate underlying CAD by cardiac catheterization in September 2023.  GDMT limited by CKD stage IIIb, orthostatic intolerance to Entresto, and recurrent UTIs on SGLT2 inhibitors.  2.  CKD stage IIIb, creatinine 1.8  with GFR 40.  3.  Paroxysmal atrial fibrillation, on Eliquis for stroke prophylaxis.  Currently in sinus rhythm.  4.  Primary hypertension.  Losartan presently held.  Continue IV Lasix 60 mg twice daily today with potential switch to oral regimen tomorrow and possible discharge home.  Also on Eliquis 2.5 mg twice daily, Lipitor 40 mg daily, Coreg 6.25 mg twice daily, hydralazine 50 mg 3 times daily, and Imdur 30 mg daily.  Replete potassium.  For questions or updates, please contact [facility] Please consult [email or web address] for contact info under   Signed, Nona Dell, MD  04/20/2023, 2:40 PM

## 2023-04-20 NOTE — Progress Notes (Signed)
  Echocardiogram 2D Echocardiogram has been performed.  Ocie Doyne RDCS 04/20/2023, 10:43 AM

## 2023-04-20 NOTE — Progress Notes (Signed)
 PROGRESS NOTE    Michael [name]  BMW:[id number] DOB: [birth year] DOA: 04/17/2023 PCP: Benita Stabile, MD   Brief Narrative:    Michael [name] is a 68 y.o. male with medical history significant for systolic and diastolic CHF, atrial fibrillation, COPD, hypertension, CKD 3, BPH. Patient presented to the ED with complaints of difficulty breathing over the past 3 days, reports difficulty breathing with exertion and at rest.   Assessment & Plan:   Principal Problem:   Acute on chronic combined systolic and diastolic CHF (congestive heart failure) (HCC) Active Problems:   Hypokalemia   Essential (primary) hypertension   CAD (coronary artery disease)   CKD stage 3b, GFR 30-44 ml/min (HCC)   Chronic obstructive pulmonary disease (HCC)   Atrial fibrillation (HCC)   Acute on chronic combined systolic (congestive) and diastolic (congestive) heart failure (HCC)  Assessment and Plan:  Acute on chronic combined systolic and diastolic CHF (congestive heart failure) (HCC) On admission. Presenting with dyspnea, trace bilateral lower extremity swelling.  BNP elevated at greater than than 4500, similar to prior hospitalization for same.  Chest x-ray with pulmonary vascular congestion, mild edema.  Not hypoxic.  Reports compliance with Lasix 40 mg daily. -Last echo 11/2022 EF of 25%. -80 mg Lasix given in ED, continue 40 twice daily -Strict input output, daily weights, daily BMP   04-18-2023 cards consult. Check ReDS. Update echo.  04-19-2023 appreciate cardiology input with adjustment to IV Lasix to 60 mg IV twice daily and start Imdur.  2D echocardiogram ordered and pending  04-20-2023 follow-up 2D echocardiogram shows LVEF 20-25% with moderate diastolic dysfunction.  Continue IV Lasix per cardiology recommendations with potential switch to oral by a.m.     Atrial fibrillation (HCC) On admission. Rate controlled and on anticoagulation. -Resume carvedilol, Eliquis   04-18-2023 stable. On  coreg and eliquis.   Chronic obstructive pulmonary disease (HCC) On admission. Stable. -Resume home regimen   04-18-2023 stable on home inhalers.   CKD stage 3b, GFR 30-44 ml/min (HCC) On admission. Creatinine 1.9.  At baseline.   04-18-2023 Scr 1.69 today. Holding ARB to give more room for diuresis.   CAD (coronary artery disease) 04-18-2023 stable.  On lipitor and coreg.   Essential (primary) hypertension On admission. BP Elevated. -Resume carvedilol, losartan   04-18-2023 stop ARB to give Scr and BP more room for diuresis.  Apparently pt intolerant to Entresto. Cards consulted.  04-19-2023 added Imdur per cardiology and continue hydralazine 50 mg every 8 hours  04-20-2023 blood pressure improved  DVT prophylaxis: Apixaban Code Status: Full Family Communication: None at bedside Disposition Plan:  Status is: Inpatient Remains inpatient appropriate because: Need for ongoing IV medications.   Consultants:  Cardiology  Procedures:  None  Antimicrobials:  None   Subjective: Patient seen and evaluated today with no new acute complaints or concerns. No acute concerns or events noted overnight.  He states he is feeling better with less shortness of breath.  Objective: Vitals:   04/20/23 0503 04/20/23 0550 04/20/23 0859 04/20/23 1354  BP: 120/86  124/78 (!) 150/93  Pulse:   66 (!) 59  Resp: 16  19   Temp: 98 F (36.7 C)  (!) 97.4 F (36.3 C) 97.8 F (36.6 C)  TempSrc: Oral  Oral Oral  SpO2: 97%  99% 100%  Weight:  57.1 kg    Height:        Intake/Output Summary (Last 24 hours) at 04/20/2023 1455 Last data filed at 04/20/2023 0830 Gross per  24 hour  Intake 1020 ml  Output 3 ml  Net 1017 ml   Filed Weights   04/18/23 0632 04/19/23 0425 04/20/23 0550  Weight: 61 kg 60.1 kg 57.1 kg    Examination:  General exam: Appears calm and comfortable  Respiratory system: Clear to auscultation. Respiratory effort normal. Cardiovascular system: S1 & S2 heard, RRR.   Gastrointestinal system: Abdomen is soft Central nervous system: Alert and awake Extremities: No edema Skin: No significant lesions noted Psychiatry: Flat affect.    Data Reviewed: I have personally reviewed following labs and imaging studies  CBC: Recent Labs  Lab 04/17/23 1300  WBC 8.1  NEUTROABS 5.3  HGB 12.6*  HCT 40.8  MCV 88.9  PLT 265   Basic Metabolic Panel: Recent Labs  Lab 04/17/23 1300 04/18/23 0454 04/19/23 0454 04/20/23 0503  NA 140 140 143 140  K 3.5 3.2* 3.8 3.2*  CL 106 109 110 105  CO2 22 21* 25 26  GLUCOSE 107* 92 109* 115*  BUN 30* 31* 31* 30*  CREATININE 1.90* 1.69* 1.83* 1.80*  CALCIUM 9.4 8.7* 8.9 8.8*  MG  --   --  2.0 1.8   GFR: Estimated Creatinine Clearance: 31.7 mL/min (A) (by C-G formula based on SCr of 1.8 mg/dL (H)). Liver Function Tests: Recent Labs  Lab 04/17/23 1300  AST 39  ALT 35  ALKPHOS 212*  BILITOT 1.8*  PROT 7.4  ALBUMIN 3.6   No results for input(s): "LIPASE", "AMYLASE" in the last 168 hours. No results for input(s): "AMMONIA" in the last 168 hours. Coagulation Profile: No results for input(s): "INR", "PROTIME" in the last 168 hours. Cardiac Enzymes: No results for input(s): "CKTOTAL", "CKMB", "CKMBINDEX", "TROPONINI" in the last 168 hours. BNP (last 3 results) No results for input(s): "PROBNP" in the last 8760 hours. HbA1C: No results for input(s): "HGBA1C" in the last 72 hours. CBG: No results for input(s): "GLUCAP" in the last 168 hours. Lipid Profile: No results for input(s): "CHOL", "HDL", "LDLCALC", "TRIG", "CHOLHDL", "LDLDIRECT" in the last 72 hours. Thyroid Function Tests: No results for input(s): "TSH", "T4TOTAL", "FREET4", "T3FREE", "THYROIDAB" in the last 72 hours. Anemia Panel: No results for input(s): "VITAMINB12", "FOLATE", "FERRITIN", "TIBC", "IRON", "RETICCTPCT" in the last 72 hours. Sepsis Labs: No results for input(s): "PROCALCITON", "LATICACIDVEN" in the last 168 hours.  Recent  Results (from the past 240 hours)  Resp panel by RT-PCR (RSV, Flu A&B, Covid) Anterior Nasal Swab     Status: None   Collection Time: 04/17/23  1:00 PM   Specimen: Anterior Nasal Swab  Result Value Ref Range Status   SARS Coronavirus 2 by RT PCR NEGATIVE NEGATIVE Final    Comment: (NOTE) SARS-CoV-2 target nucleic acids are NOT DETECTED.  The SARS-CoV-2 RNA is generally detectable in upper respiratory specimens during the acute phase of infection. The lowest concentration of SARS-CoV-2 viral copies this assay can detect is 138 copies/mL. A negative result does not preclude SARS-Cov-2 infection and should not be used as the sole basis for treatment or other patient management decisions. A negative result may occur with  improper specimen collection/handling, submission of specimen other than nasopharyngeal swab, presence of viral mutation(s) within the areas targeted by this assay, and inadequate number of viral copies(<138 copies/mL). A negative result must be combined with clinical observations, patient history, and epidemiological information. The expected result is Negative.  Fact Sheet for Patients:  BloggerCourse.com  Fact Sheet for Healthcare Providers:  SeriousBroker.it  This test is no t yet approved or  cleared by the [place] and  has been authorized for detection and/or diagnosis of SARS-CoV-2 by FDA under an Emergency Use Authorization (EUA). This EUA will remain  in effect (meaning this test can be used) for the duration of the COVID-19 declaration under Section 564(b)(1) of the Act, 21 U.S.C.section 360bbb-3(b)(1), unless the authorization is terminated  or revoked sooner.       Influenza A by PCR NEGATIVE NEGATIVE Final   Influenza B by PCR NEGATIVE NEGATIVE Final    Comment: (NOTE) The Xpert Xpress SARS-CoV-2/FLU/RSV plus assay is intended as an aid in the diagnosis of influenza from Nasopharyngeal swab  specimens and should not be used as a sole basis for treatment. Nasal washings and aspirates are unacceptable for Xpert Xpress SARS-CoV-2/FLU/RSV testing.  Fact Sheet for Patients: BloggerCourse.com  Fact Sheet for Healthcare Providers: SeriousBroker.it  This test is not yet approved or cleared by the [place] FDA and has been authorized for detection and/or diagnosis of SARS-CoV-2 by FDA under an Emergency Use Authorization (EUA). This EUA will remain in effect (meaning this test can be used) for the duration of the COVID-19 declaration under Section 564(b)(1) of the Act, 21 U.S.C. section 360bbb-3(b)(1), unless the authorization is terminated or revoked.     Resp Syncytial Virus by PCR NEGATIVE NEGATIVE Final    Comment: (NOTE) Fact Sheet for Patients: BloggerCourse.com  Fact Sheet for Healthcare Providers: SeriousBroker.it  This test is not yet approved or cleared by the [place] FDA and has been authorized for detection and/or diagnosis of SARS-CoV-2 by FDA under an Emergency Use Authorization (EUA). This EUA will remain in effect (meaning this test can be used) for the duration of the COVID-19 declaration under Section 564(b)(1) of the Act, 21 U.S.C. section 360bbb-3(b)(1), unless the authorization is terminated or revoked.  Performed at [facility], [address] [place], [place] [address]          Radiology Studies: ECHOCARDIOGRAM COMPLETE Result Date: 04/20/2023    ECHOCARDIOGRAM REPORT   Patient Name:   VERLIN UHER [facility] Date of Exam: 04/20/2023 Medical Rec #:  [id number]           Height:       72.0 in Accession #:    [id number]          Weight:       125.8 lb Date of Birth:  [birth year]           BSA:          1.750 m Patient Age:    67 years            BP:           124/78 mmHg Patient Gender: M                   HR:           64 bpm. [facility] Procedure: 2D Echo, Cardiac Doppler and Color Doppler (Both Spectral and Color            Flow Doppler were utilized during procedure). Indications:    CHF  History:        Patient has prior history of Echocardiogram examinations, most                 recent 11/05/2022. Cardiomyopathy, CAD, COPD, Arrythmias:Atrial                 Fibrillation, Signs/Symptoms:Syncope; Risk Factors:Hypertension  and Dyslipidemia. First degree heart block.  Sonographer:    Vern Claude Referring Phys: [id number] SAMUEL G MCDOWELL IMPRESSIONS  1. Left ventricular ejection fraction, by estimation, is 20 to 25%. The left ventricle has severely decreased function. The left ventricle demonstrates regional wall motion abnormalities (see scoring diagram/findings for description). The left ventricular internal cavity size was mildly to moderately dilated. There is mild concentric left ventricular hypertrophy. Left ventricular diastolic parameters are consistent with Grade II diastolic dysfunction (pseudonormalization).  2. Right ventricular systolic function is mildly reduced. The right ventricular size is normal. Tricuspid regurgitation signal is inadequate for assessing PA pressure.  3. Left atrial size was mildly dilated.  4. Right atrial size was mildly dilated.  5. The mitral valve is degenerative. Mild mitral valve regurgitation.  6. The aortic valve is tricuspid. Aortic valve regurgitation is not visualized. No aortic stenosis is present. Aortic valve mean gradient measures 3.5 mmHg.  7. The inferior vena cava is normal in size with greater than 50% respiratory variability, suggesting right atrial pressure of 3 mmHg. Comparison(s): Prior images reviewed side by side. LVEF remains severely reduced in 20-25% range. FINDINGS  Left Ventricle: Left ventricular ejection fraction, by estimation, is 20 to 25%. The left ventricle has severely decreased function. The left ventricle demonstrates regional wall motion  abnormalities. The left ventricular internal cavity size was mildly  to moderately dilated. There is mild concentric left ventricular hypertrophy. Left ventricular diastolic parameters are consistent with Grade II diastolic dysfunction (pseudonormalization).  LV Wall Scoring: The antero-lateral wall, posterior wall, and basal inferior segment are akinetic. The entire anterior wall, entire septum, entire apex, and mid and distal inferior wall are hypokinetic. Right Ventricle: The right ventricular size is normal. No increase in right ventricular wall thickness. Right ventricular systolic function is mildly reduced. Tricuspid regurgitation signal is inadequate for assessing PA pressure. Left Atrium: Left atrial size was mildly dilated. Right Atrium: Right atrial size was mildly dilated. Pericardium: There is no evidence of pericardial effusion. Mitral Valve: The mitral valve is degenerative in appearance. There is mild thickening of the mitral valve leaflet(s). Mild mitral annular calcification. Mild mitral valve regurgitation. MV peak gradient, 2.0 mmHg. The mean mitral valve gradient is 1.0 mmHg. Tricuspid Valve: The tricuspid valve is grossly normal. Tricuspid valve regurgitation is trivial. Aortic Valve: The aortic valve is tricuspid. There is mild aortic valve annular calcification. Aortic valve regurgitation is not visualized. No aortic stenosis is present. Aortic valve mean gradient measures 3.5 mmHg. Aortic valve peak gradient measures 7.1 mmHg. Aortic valve area, by VTI measures 2.32 cm. Pulmonic Valve: The pulmonic valve was grossly normal. Pulmonic valve regurgitation is trivial. Aorta: The aortic root and ascending aorta are structurally normal, with no evidence of dilitation. Venous: The inferior vena cava is normal in size with greater than 50% respiratory variability, suggesting right atrial pressure of 3 mmHg. IAS/Shunts: The interatrial septum was not well visualized. Additional Comments: 3D was  performed not requiring image post processing on an independent workstation and was indeterminate.  LEFT VENTRICLE PLAX 2D LVIDd:         6.20 cm      Diastology LVIDs:         5.30 cm      LV e' lateral:   4.57 cm/s LV PW:         1.10 cm      LV E/e' lateral: 21.5 LV IVS:        1.10 cm LVOT diam:  2.30 cm LV SV:         56 LV SV Index:   32 LVOT Area:     4.15 cm  LV Volumes (MOD) LV vol d, MOD A2C: 218.0 ml LV vol d, MOD A4C: 293.0 ml LV vol s, MOD A2C: 158.0 ml LV vol s, MOD A4C: 237.0 ml LV SV MOD A2C:     60.0 ml LV SV MOD A4C:     293.0 ml LV SV MOD BP:      61.9 ml RIGHT VENTRICLE            IVC RV Basal diam:  5.20 cm    IVC diam: 1.10 cm RV Mid diam:    3.70 cm RV S prime:     7.40 cm/s TAPSE (M-mode): 1.7 cm LEFT ATRIUM              Index        RIGHT ATRIUM           Index LA diam:        4.80 cm  2.74 cm/m   RA Area:     21.40 cm LA Vol (A2C):   122.0 ml 69.73 ml/m  RA Volume:   60.60 ml  34.64 ml/m LA Vol (A4C):   51.8 ml  29.61 ml/m LA Biplane Vol: 87.9 ml  50.24 ml/m  AORTIC VALVE                    PULMONIC VALVE AV Area (Vmax):    2.44 cm     PV Vmax:       0.73 m/s AV Area (Vmean):   2.32 cm     PV Peak grad:  2.1 mmHg AV Area (VTI):     2.32 cm AV Vmax:           133.50 cm/s AV Vmean:          92.250 cm/s AV VTI:            0.240 m AV Peak Grad:      7.1 mmHg AV Mean Grad:      3.5 mmHg LVOT Vmax:         78.30 cm/s LVOT Vmean:        51.600 cm/s LVOT VTI:          0.134 m LVOT/AV VTI ratio: 0.56  AORTA Ao Root diam: 3.70 cm Ao Asc diam:  3.40 cm MITRAL VALVE MV Area (PHT): 3.06 cm    SHUNTS MV Area VTI:   1.94 cm    Systemic VTI:  0.13 m MV Peak grad:  2.0 mmHg    Systemic Diam: 2.30 cm MV Mean grad:  1.0 mmHg MV Vmax:       0.71 m/s MV Vmean:      42.3 cm/s MV Decel Time: 248 msec MV E velocity: 98.20 cm/s MV A velocity: 51.40 cm/s MV E/A ratio:  1.91 Nona Dell MD Electronically signed by Nona Dell MD Signature Date/Time: 04/20/2023/2:28:46 PM    Final          Scheduled Meds:  apixaban  2.5 mg Oral BID   atorvastatin  40 mg Oral QPM   carvedilol  6.25 mg Oral BID WC   furosemide  60 mg Intravenous Q12H   hydrALAZINE  50 mg Oral Q8H   isosorbide mononitrate  30 mg Oral QPM   potassium chloride  40 mEq Oral BID     LOS: 2 days  Time spent: 35 minutes    Hebert Dooling Hoover Brunette, DO [facility]  If 7PM-7AM, please contact night-coverage [email or web address] 04/20/2023, 2:55 PM

## 2023-04-21 ENCOUNTER — Other Ambulatory Visit (HOSPITAL_COMMUNITY): Payer: Self-pay

## 2023-04-21 ENCOUNTER — Telehealth (HOSPITAL_COMMUNITY): Payer: Self-pay | Admitting: Pharmacy Technician

## 2023-04-21 ENCOUNTER — Other Ambulatory Visit: Payer: Self-pay | Admitting: *Deleted

## 2023-04-21 DIAGNOSIS — I5020 Unspecified systolic (congestive) heart failure: Secondary | ICD-10-CM | POA: Diagnosis not present

## 2023-04-21 DIAGNOSIS — I5043 Acute on chronic combined systolic (congestive) and diastolic (congestive) heart failure: Secondary | ICD-10-CM | POA: Diagnosis not present

## 2023-04-21 LAB — BASIC METABOLIC PANEL
Anion gap: 9 (ref 5–15)
BUN: 34 mg/dL — ABNORMAL HIGH (ref 8–23)
CO2: 26 mmol/L (ref 22–32)
Calcium: 8.4 mg/dL — ABNORMAL LOW (ref 8.9–10.3)
Chloride: 104 mmol/L (ref 98–111)
Creatinine, Ser: 1.9 mg/dL — ABNORMAL HIGH (ref 0.61–1.24)
GFR, Estimated: 38 mL/min — ABNORMAL LOW (ref >60)
Glucose, Bld: 108 mg/dL — ABNORMAL HIGH (ref 70–99)
Potassium: 3.4 mmol/L — ABNORMAL LOW (ref 3.5–5.1)
Sodium: 139 mmol/L (ref 135–145)

## 2023-04-21 LAB — MAGNESIUM: Magnesium: 2 mg/dL (ref 1.7–2.4)

## 2023-04-21 MED ORDER — POTASSIUM CHLORIDE CRYS ER 20 MEQ PO TBCR
40.0000 meq | EXTENDED_RELEASE_TABLET | Freq: Three times a day (TID) | ORAL | Status: DC
  Administered 2023-04-21: 40 meq via ORAL
  Filled 2023-04-21: qty 2

## 2023-04-21 MED ORDER — VERQUVO 2.5 MG PO TABS: 2.5000 mg | ORAL_TABLET | Freq: Every day | ORAL | 0 refills | Status: DC

## 2023-04-21 MED ORDER — APIXABAN 2.5 MG PO TABS: 2.5000 mg | ORAL_TABLET | Freq: Two times a day (BID) | ORAL | 1 refills | Status: DC

## 2023-04-21 NOTE — Care Management Important Message (Signed)
 Important Message  Patient Details  Name: Michael [name] MRN: [id number] Date of Birth: 1955-04-07   Important Message Given:  Yes - Medicare IM     Corey Harold 04/21/2023, 11:26 AM

## 2023-04-21 NOTE — Telephone Encounter (Signed)
 Pharmacy Patient Advocate Encounter   Received notification that prior authorization for Verquvo 2.5MG  tablets is required/requested.   Insurance verification completed.   The patient is insured through [employer] .   Per test claim: PA required; PA submitted to above mentioned insurance via CoverMyMeds Key/confirmation #/[id number] Status is pending

## 2023-04-21 NOTE — Discharge Summary (Signed)
 Physician Discharge Summary  Zebastian Carico WUX:[id number] DOB: 08-15-1955 DOA: 04/17/2023  PCP: Benita Stabile, MD  Admit date: 04/17/2023  Discharge date: 04/21/2023  Admitted From:Home  Disposition:  Home  Recommendations for Outpatient Follow-up:  Follow up with PCP in 1-2 weeks Follow-up with heart failure clinic as scheduled on 3/31 and then further follow-up with cardiology will be scheduled Continue on medications as noted below with addition of Verquvo 2.5mg  daily and voucher provided for 30-day free supply, will need to work on prior authorization for further approval and this will be done per cardiology  Home Health: None  Equipment/Devices: None  Discharge Condition:Stable  CODE STATUS: Full  Diet recommendation: Heart Healthy  Brief/Interim Summary: Phuong Moffatt is a 68 y.o. male with medical history significant for systolic and diastolic CHF, atrial fibrillation, COPD, hypertension, CKD 3, BPH. Patient presented to the ED with complaints of difficulty breathing over the past 3 days, reports difficulty breathing with exertion and at rest.  Patient was admitted for acute on chronic combined systolic and diastolic CHF exacerbation and was placed on IV diuresis for several days and followed by cardiology.  LVEF as noted below and appears stable from prior.  He will remain on his usual home dose of medications as far as diuresis and will follow-up with heart failure clinic.  No other acute events or concerns noted.  Discharge Diagnoses:  Principal Problem:   Acute on chronic combined systolic and diastolic CHF (congestive heart failure) (HCC) Active Problems:   Hypokalemia   Essential (primary) hypertension   CAD (coronary artery disease)   CKD stage 3b, GFR 30-44 ml/min (HCC)   Chronic obstructive pulmonary disease (HCC)   Atrial fibrillation (HCC)   Acute on chronic combined systolic (congestive) and diastolic (congestive) heart failure (HCC)  Principal  discharge diagnosis: Acute on chronic combined systolic and diastolic CHF exacerbation.  Discharge Instructions  Discharge Instructions     Diet - low sodium heart healthy   Complete by: As directed    Increase activity slowly   Complete by: As directed       Allergies as of 04/21/2023   No Known Allergies      Medication List     TAKE these medications    apixaban 2.5 MG Tabs tablet Commonly known as: ELIQUIS Take 1 tablet (2.5 mg total) by mouth 2 (two) times daily. What changed:  medication strength how much to take   atorvastatin 40 MG tablet Commonly known as: LIPITOR Take 1 tablet (40 mg total) by mouth every evening. What changed: when to take this   Breztri Aerosphere 160-9-4.8 MCG/ACT Aero Generic drug: budeson-glycopyrrolate-formoterol Inhale 2 puffs into the lungs 2 (two) times daily.   carvedilol 6.25 MG tablet Commonly known as: COREG Take 1 tablet (6.25 mg total) by mouth 2 (two) times daily with a meal.   furosemide 40 MG tablet Commonly known as: LASIX Take 1 tablet (40 mg total) by mouth 2 (two) times daily.   losartan 25 MG tablet Commonly known as: COZAAR Take 25 mg by mouth daily.   nitroGLYCERIN 0.4 MG SL tablet Commonly known as: NITROSTAT Place 1 tablet (0.4 mg total) under the tongue every 5 (five) minutes as needed for chest pain.   potassium chloride SA 20 MEQ tablet Commonly known as: KLOR-CON M Take 1 tablet (20 mEq total) by mouth daily.   Verquvo 2.5 MG Tabs Generic drug: Vericiguat Take 1 tablet (2.5 mg total) by mouth daily.  Follow-up Information     [facility]. Go on 05/01/2023.   Specialty: Cardiology Why: Hospital Follow-Up 05/01/2023 @ 10:30 AM Please bring all medications to follow-up appointment [facility], [address] Floor, [address] Free Valet Parking at the door Contact information: [address] [place]  [address] [phone]        Benita Stabile, MD. Schedule an appointment as soon as possible for a visit in 1 month(s).   Specialty: Internal Medicine Contact information: [address] [place] [address] [phone]                No Known Allergies  Consultations: Cardiology   Procedures/Studies: ECHOCARDIOGRAM COMPLETE Result Date: 04/20/2023    ECHOCARDIOGRAM REPORT   Patient Name:   OWAIN ECKERMAN [facility] Date of Exam: 04/20/2023 Medical Rec #:  [id number]           Height:       72.0 in Accession #:    [id number]          Weight:       125.8 lb Date of Birth:  1955-11-12           BSA:          1.750 m Patient Age:    67 years            BP:           124/78 mmHg Patient Gender: M                   HR:           64 bpm. [facility] Procedure: 2D Echo, Cardiac Doppler and Color Doppler (Both Spectral and Color            Flow Doppler were utilized during procedure). Indications:    CHF  History:        Patient has prior history of Echocardiogram examinations, most                 recent 11/05/2022. Cardiomyopathy, CAD, COPD, Arrythmias:Atrial                 Fibrillation, Signs/Symptoms:Syncope; Risk Factors:Hypertension                 and Dyslipidemia. First degree heart block.  Sonographer:    Vern Claude Referring Phys: [id number] SAMUEL G MCDOWELL IMPRESSIONS  1. Left ventricular ejection fraction, by estimation, is 20 to 25%. The left ventricle has severely decreased function. The left ventricle demonstrates regional wall motion abnormalities (see scoring diagram/findings for description). The left ventricular internal cavity size was mildly to moderately dilated. There is mild concentric left ventricular hypertrophy. Left ventricular diastolic parameters are consistent with Grade II diastolic dysfunction (pseudonormalization).  2. Right ventricular systolic function is mildly reduced. The right ventricular size is normal. Tricuspid regurgitation signal is inadequate  for assessing PA pressure.  3. Left atrial size was mildly dilated.  4. Right atrial size was mildly dilated.  5. The mitral valve is degenerative. Mild mitral valve regurgitation.  6. The aortic valve is tricuspid. Aortic valve regurgitation is not visualized. No aortic stenosis is present. Aortic valve mean gradient measures 3.5 mmHg.  7. The inferior vena cava is normal in size with greater than 50% respiratory variability, suggesting right atrial pressure of 3 mmHg. Comparison(s): Prior images reviewed side by side. LVEF remains severely reduced in 20-25% range. FINDINGS  Left Ventricle: Left ventricular ejection fraction, by estimation, is 20 to 25%. The left ventricle has severely decreased function. The left ventricle demonstrates regional wall motion abnormalities. The left ventricular internal cavity size was mildly  to moderately dilated. There is mild concentric left ventricular hypertrophy. Left ventricular diastolic parameters are consistent with Grade II diastolic dysfunction (pseudonormalization).  LV Wall Scoring: The antero-lateral wall, posterior wall, and basal inferior segment are akinetic. The entire anterior wall, entire septum, entire apex, and mid and distal inferior wall are hypokinetic. Right Ventricle: The right ventricular size is normal. No increase in right ventricular wall thickness. Right ventricular systolic function is mildly reduced. Tricuspid regurgitation signal is inadequate for assessing PA pressure. Left Atrium: Left atrial size was mildly dilated. Right Atrium: Right atrial size was mildly dilated. Pericardium: There is no evidence of pericardial effusion. Mitral Valve: The mitral valve is degenerative in appearance. There is mild thickening of the mitral valve leaflet(s). Mild mitral annular calcification. Mild mitral valve regurgitation. MV peak gradient, 2.0 mmHg. The mean mitral valve gradient is 1.0 mmHg. Tricuspid Valve: The tricuspid valve is grossly normal. Tricuspid  valve regurgitation is trivial. Aortic Valve: The aortic valve is tricuspid. There is mild aortic valve annular calcification. Aortic valve regurgitation is not visualized. No aortic stenosis is present. Aortic valve mean gradient measures 3.5 mmHg. Aortic valve peak gradient measures 7.1 mmHg. Aortic valve area, by VTI measures 2.32 cm. Pulmonic Valve: The pulmonic valve was grossly normal. Pulmonic valve regurgitation is trivial. Aorta: The aortic root and ascending aorta are structurally normal, with no evidence of dilitation. Venous: The inferior vena cava is normal in size with greater than 50% respiratory variability, suggesting right atrial pressure of 3 mmHg. IAS/Shunts: The interatrial septum was not well visualized. Additional Comments: 3D was performed not requiring image post processing on an independent workstation and was indeterminate.  LEFT VENTRICLE PLAX 2D LVIDd:         6.20 cm      Diastology LVIDs:         5.30 cm      LV e' lateral:   4.57 cm/s LV PW:         1.10 cm      LV E/e' lateral: 21.5 LV IVS:        1.10 cm LVOT diam:     2.30 cm LV SV:         56 LV SV Index:   32 LVOT Area:     4.15 cm  LV Volumes (MOD) LV vol d, MOD A2C: 218.0 ml LV vol d, MOD A4C: 293.0 ml LV vol s, MOD A2C: 158.0 ml LV vol s, MOD A4C: 237.0 ml LV SV MOD A2C:     60.0 ml LV SV MOD A4C:     293.0 ml LV SV MOD BP:      61.9 ml RIGHT VENTRICLE            IVC RV Basal diam:  5.20 cm    IVC diam: 1.10 cm RV Mid diam:    3.70 cm RV S prime:     7.40 cm/s TAPSE (M-mode): 1.7 cm LEFT ATRIUM              Index        RIGHT ATRIUM           Index LA diam:        4.80 cm  2.74 cm/m   RA Area:     21.40 cm  LA Vol (A2C):   122.0 ml 69.73 ml/m  RA Volume:   60.60 ml  34.64 ml/m LA Vol (A4C):   51.8 ml  29.61 ml/m LA Biplane Vol: 87.9 ml  50.24 ml/m  AORTIC VALVE                    PULMONIC VALVE AV Area (Vmax):    2.44 cm     PV Vmax:       0.73 m/s AV Area (Vmean):   2.32 cm     PV Peak grad:  2.1 mmHg AV Area (VTI):      2.32 cm AV Vmax:           133.50 cm/s AV Vmean:          92.250 cm/s AV VTI:            0.240 m AV Peak Grad:      7.1 mmHg AV Mean Grad:      3.5 mmHg LVOT Vmax:         78.30 cm/s LVOT Vmean:        51.600 cm/s LVOT VTI:          0.134 m LVOT/AV VTI ratio: 0.56  AORTA Ao Root diam: 3.70 cm Ao Asc diam:  3.40 cm MITRAL VALVE MV Area (PHT): 3.06 cm    SHUNTS MV Area VTI:   1.94 cm    Systemic VTI:  0.13 m MV Peak grad:  2.0 mmHg    Systemic Diam: 2.30 cm MV Mean grad:  1.0 mmHg MV Vmax:       0.71 m/s MV Vmean:      42.3 cm/s MV Decel Time: 248 msec MV E velocity: 98.20 cm/s MV A velocity: 51.40 cm/s MV E/A ratio:  1.91 Nona Dell MD Electronically signed by Nona Dell MD Signature Date/Time: 04/20/2023/2:28:46 PM    Final    DG Chest 2 View Result Date: 04/17/2023 CLINICAL DATA:  Shortness of breath EXAM: CHEST - 2 VIEW COMPARISON:  03/31/2023 FINDINGS: Cardiomegaly. Aortic atherosclerosis. Pulmonary vascular congestion. Mild interstitial prominence. No focal airspace consolidation, pleural effusion, or pneumothorax. IMPRESSION: Cardiomegaly with pulmonary vascular congestion and mild interstitial prominence, suggesting mild edema. Electronically Signed   By: Duanne Guess D.O.   On: 04/17/2023 15:08   DG Chest Port 1 View Result Date: 03/31/2023 CLINICAL DATA:  Shortness of breath EXAM: PORTABLE CHEST 1 VIEW COMPARISON:  02/21/2023 FINDINGS: Cardiomegaly with mild central congestion. No focal airspace disease, pleural effusion or pneumothorax. Aortic atherosclerosis. IMPRESSION: Cardiomegaly with mild central congestion Electronically Signed   By: Jasmine Pang M.D.   On: 03/31/2023 18:27     Discharge Exam: Vitals:   04/20/23 2105 04/21/23 0601  BP: 110/65 130/68  Pulse: 64 94  Resp: 19 17  Temp: 97.8 F (36.6 C) 97.8 F (36.6 C)  SpO2: 97% 97%   Vitals:   04/20/23 1720 04/20/23 2105 04/21/23 0601 04/21/23 0642  BP: (!) 125/97 110/65 130/68   Pulse:  64 94   Resp:  19  17   Temp:  97.8 F (36.6 C) 97.8 F (36.6 C)   TempSrc:  Oral Oral   SpO2:  97% 97%   Weight:    57.8 kg  Height:        General: Pt is alert, awake, not in acute distress Cardiovascular: RRR, S1/S2 +, no rubs, no gallops Respiratory: CTA bilaterally, no wheezing, no rhonchi Abdominal: Soft, NT, ND, bowel sounds + Extremities: no  edema, no cyanosis    The results of significant diagnostics from this hospitalization (including imaging, microbiology, ancillary and laboratory) are listed below for reference.     Microbiology: Recent Results (from the past 240 hours)  Resp panel by RT-PCR (RSV, Flu A&B, Covid) Anterior Nasal Swab     Status: None   Collection Time: 04/17/23  1:00 PM   Specimen: Anterior Nasal Swab  Result Value Ref Range Status   SARS Coronavirus 2 by RT PCR NEGATIVE NEGATIVE Final    Comment: (NOTE) SARS-CoV-2 target nucleic acids are NOT DETECTED.  The SARS-CoV-2 RNA is generally detectable in upper respiratory specimens during the acute phase of infection. The lowest concentration of SARS-CoV-2 viral copies this assay can detect is 138 copies/mL. A negative result does not preclude SARS-Cov-2 infection and should not be used as the sole basis for treatment or other patient management decisions. A negative result may occur with  improper specimen collection/handling, submission of specimen other than nasopharyngeal swab, presence of viral mutation(s) within the areas targeted by this assay, and inadequate number of viral copies(<138 copies/mL). A negative result must be combined with clinical observations, patient history, and epidemiological information. The expected result is Negative.  Fact Sheet for Patients:  BloggerCourse.com  Fact Sheet for Healthcare Providers:  SeriousBroker.it  This test is no t yet approved or cleared by the [place] FDA and  has been authorized for detection and/or  diagnosis of SARS-CoV-2 by FDA under an Emergency Use Authorization (EUA). This EUA will remain  in effect (meaning this test can be used) for the duration of the COVID-19 declaration under Section 564(b)(1) of the Act, 21 U.S.C.section 360bbb-3(b)(1), unless the authorization is terminated  or revoked sooner.       Influenza A by PCR NEGATIVE NEGATIVE Final   Influenza B by PCR NEGATIVE NEGATIVE Final    Comment: (NOTE) The Xpert Xpress SARS-CoV-2/FLU/RSV plus assay is intended as an aid in the diagnosis of influenza from Nasopharyngeal swab specimens and should not be used as a sole basis for treatment. Nasal washings and aspirates are unacceptable for Xpert Xpress SARS-CoV-2/FLU/RSV testing.  Fact Sheet for Patients: BloggerCourse.com  Fact Sheet for Healthcare Providers: SeriousBroker.it  This test is not yet approved or cleared by the [place] FDA and has been authorized for detection and/or diagnosis of SARS-CoV-2 by FDA under an Emergency Use Authorization (EUA). This EUA will remain in effect (meaning this test can be used) for the duration of the COVID-19 declaration under Section 564(b)(1) of the Act, 21 U.S.C. section 360bbb-3(b)(1), unless the authorization is terminated or revoked.     Resp Syncytial Virus by PCR NEGATIVE NEGATIVE Final    Comment: (NOTE) Fact Sheet for Patients: BloggerCourse.com  Fact Sheet for Healthcare Providers: SeriousBroker.it  This test is not yet approved or cleared by the [place] FDA and has been authorized for detection and/or diagnosis of SARS-CoV-2 by FDA under an Emergency Use Authorization (EUA). This EUA will remain in effect (meaning this test can be used) for the duration of the COVID-19 declaration under Section 564(b)(1) of the Act, 21 U.S.C. section 360bbb-3(b)(1), unless the authorization is terminated  or revoked.  Performed at [facility], [address] [place], [place] [address]      Labs: BNP (last 3 results) Recent Labs    02/25/23 0603 03/31/23 1758 04/17/23 1300  BNP 1,705.0* >4,500.0* >4,500.0*   Basic Metabolic Panel: Recent Labs  Lab 04/17/23 1300 04/18/23 0454 04/19/23 0454 04/20/23 0503 04/21/23 0454  NA 140 140 143 140 139  K 3.5 3.2* 3.8 3.2* 3.4*  CL 106 109 110 105 104  CO2 22 21* 25 26 26   GLUCOSE 107* 92 109* 115* 108*  BUN 30* 31* 31* 30* 34*  CREATININE 1.90* 1.69* 1.83* 1.80* 1.90*  CALCIUM 9.4 8.7* 8.9 8.8* 8.4*  MG  --   --  2.0 1.8 2.0   Liver Function Tests: Recent Labs  Lab 04/17/23 1300  AST 39  ALT 35  ALKPHOS 212*  BILITOT 1.8*  PROT 7.4  ALBUMIN 3.6   No results for input(s): "LIPASE", "AMYLASE" in the last 168 hours. No results for input(s): "AMMONIA" in the last 168 hours. CBC: Recent Labs  Lab 04/17/23 1300  WBC 8.1  NEUTROABS 5.3  HGB 12.6*  HCT 40.8  MCV 88.9  PLT 265   Cardiac Enzymes: No results for input(s): "CKTOTAL", "CKMB", "CKMBINDEX", "TROPONINI" in the last 168 hours. BNP: Invalid input(s): "POCBNP" CBG: No results for input(s): "GLUCAP" in the last 168 hours. D-Dimer No results for input(s): "DDIMER" in the last 72 hours. Hgb A1c No results for input(s): "HGBA1C" in the last 72 hours. Lipid Profile No results for input(s): "CHOL", "HDL", "LDLCALC", "TRIG", "CHOLHDL", "LDLDIRECT" in the last 72 hours. Thyroid function studies No results for input(s): "TSH", "T4TOTAL", "T3FREE", "THYROIDAB" in the last 72 hours.  Invalid input(s): "FREET3" Anemia work up No results for input(s): "VITAMINB12", "FOLATE", "FERRITIN", "TIBC", "IRON", "RETICCTPCT" in the last 72 hours. Urinalysis    Component Value Date/Time   COLORURINE YELLOW 11/21/2022 2100   APPEARANCEUR CLEAR 11/21/2022 2100   APPEARANCEUR Cloudy (A) 06/16/2021 0936   LABSPEC 1.017 11/21/2022 2100   LABSPEC 1.018 04/23/2011 1044   PHURINE  5.0 11/21/2022 2100   GLUCOSEU NEGATIVE 11/21/2022 2100   GLUCOSEU Negative 04/23/2011 1044   HGBUR NEGATIVE 11/21/2022 2100   BILIRUBINUR NEGATIVE 11/21/2022 2100   BILIRUBINUR Negative 06/16/2021 0936   BILIRUBINUR Negative 04/23/2011 1044   KETONESUR NEGATIVE 11/21/2022 2100   PROTEINUR 100 (A) 11/21/2022 2100   NITRITE NEGATIVE 11/21/2022 2100   LEUKOCYTESUR NEGATIVE 11/21/2022 2100   LEUKOCYTESUR Trace 04/23/2011 1044   Sepsis Labs Recent Labs  Lab 04/17/23 1300  WBC 8.1   Microbiology Recent Results (from the past 240 hours)  Resp panel by RT-PCR (RSV, Flu A&B, Covid) Anterior Nasal Swab     Status: None   Collection Time: 04/17/23  1:00 PM   Specimen: Anterior Nasal Swab  Result Value Ref Range Status   SARS Coronavirus 2 by RT PCR NEGATIVE NEGATIVE Final    Comment: (NOTE) SARS-CoV-2 target nucleic acids are NOT DETECTED.  The SARS-CoV-2 RNA is generally detectable in upper respiratory specimens during the acute phase of infection. The lowest concentration of SARS-CoV-2 viral copies this assay can detect is 138 copies/mL. A negative result does not preclude SARS-Cov-2 infection and should not be used as the sole basis for treatment or other patient management decisions. A negative result may occur with  improper specimen collection/handling, submission of specimen other than nasopharyngeal swab, presence of viral mutation(s) within the areas targeted by this assay, and inadequate number of viral copies(<138 copies/mL). A negative result must be combined with clinical observations, patient history, and epidemiological information. The expected result is Negative.  Fact Sheet for Patients:  BloggerCourse.com  Fact Sheet for Healthcare Providers:  SeriousBroker.it  This test is no t yet approved or cleared by the [place] FDA and  has been authorized for detection and/or diagnosis of SARS-CoV-2 by  FDA under  an Emergency Use Authorization (EUA). This EUA will remain  in effect (meaning this test can be used) for the duration of the COVID-19 declaration under Section 564(b)(1) of the Act, 21 U.S.C.section 360bbb-3(b)(1), unless the authorization is terminated  or revoked sooner.       Influenza A by PCR NEGATIVE NEGATIVE Final   Influenza B by PCR NEGATIVE NEGATIVE Final    Comment: (NOTE) The Xpert Xpress SARS-CoV-2/FLU/RSV plus assay is intended as an aid in the diagnosis of influenza from Nasopharyngeal swab specimens and should not be used as a sole basis for treatment. Nasal washings and aspirates are unacceptable for Xpert Xpress SARS-CoV-2/FLU/RSV testing.  Fact Sheet for Patients: BloggerCourse.com  Fact Sheet for Healthcare Providers: SeriousBroker.it  This test is not yet approved or cleared by the [place] FDA and has been authorized for detection and/or diagnosis of SARS-CoV-2 by FDA under an Emergency Use Authorization (EUA). This EUA will remain in effect (meaning this test can be used) for the duration of the COVID-19 declaration under Section 564(b)(1) of the Act, 21 U.S.C. section 360bbb-3(b)(1), unless the authorization is terminated or revoked.     Resp Syncytial Virus by PCR NEGATIVE NEGATIVE Final    Comment: (NOTE) Fact Sheet for Patients: BloggerCourse.com  Fact Sheet for Healthcare Providers: SeriousBroker.it  This test is not yet approved or cleared by the [place] FDA and has been authorized for detection and/or diagnosis of SARS-CoV-2 by FDA under an Emergency Use Authorization (EUA). This EUA will remain in effect (meaning this test can be used) for the duration of the COVID-19 declaration under Section 564(b)(1) of the Act, 21 U.S.C. section 360bbb-3(b)(1), unless the authorization is terminated or revoked.  Performed at [facility], [address] [place], [place] [address]      Time coordinating discharge: 35 minutes  SIGNED:   Erick Blinks, DO [facility] 04/21/2023, 11:00 AM  If 7PM-7AM, please contact night-coverage [email or web address]

## 2023-04-21 NOTE — Progress Notes (Signed)
 Progress Note  Patient Name: Michael [name] Date of Encounter: 04/21/2023  Primary Cardiologist: Nona Dell, MD  Interval Summary   Interval chart reviewed.  Patient reports no chest pain or shortness of breath at rest, feels better overall.  Creatinine has bumped up to 1.9 with IV diuresis.  Vital Signs    Vitals:   04/20/23 1720 04/20/23 2105 04/21/23 0601 04/21/23 0642  BP: (!) 125/97 110/65 130/68   Pulse:  64 94   Resp:  19 17   Temp:  97.8 F (36.6 C) 97.8 F (36.6 C)   TempSrc:  Oral Oral   SpO2:  97% 97%   Weight:    57.8 kg  Height:        Intake/Output Summary (Last 24 hours) at 04/21/2023 1017 Last data filed at 04/21/2023 0900 Gross per 24 hour  Intake 1290 ml  Output 730 ml  Net 560 ml   Filed Weights   04/19/23 0425 04/20/23 0550 04/21/23 0642  Weight: 60.1 kg 57.1 kg 57.8 kg    Physical Exam   GEN: Chronically ill-appearing, no acute distress.   Neck: No JVD. Cardiac: RRR without gallop. Respiratory: Nonlabored.  Clear with decreased breath sounds, no wheezing. GI: Soft, nontender, bowel sounds present. MS: No edema.  ECG/Telemetry    Telemetry reviewed showing sinus rhythm.  Labs    Chemistry Recent Labs  Lab 04/17/23 1300 04/18/23 0454 04/19/23 0454 04/20/23 0503 04/21/23 0454  NA 140   < > 143 140 139  K 3.5   < > 3.8 3.2* 3.4*  CL 106   < > 110 105 104  CO2 22   < > 25 26 26   GLUCOSE 107*   < > 109* 115* 108*  BUN 30*   < > 31* 30* 34*  CREATININE 1.90*   < > 1.83* 1.80* 1.90*  CALCIUM 9.4   < > 8.9 8.8* 8.4*  PROT 7.4  --   --   --   --   ALBUMIN 3.6  --   --   --   --   AST 39  --   --   --   --   ALT 35  --   --   --   --   ALKPHOS 212*  --   --   --   --   BILITOT 1.8*  --   --   --   --   GFRNONAA 38*   < > 40* 40* 38*  ANIONGAP 12   < > 8 9 9    < > = values in this interval not displayed.    Hematology Recent Labs  Lab 04/17/23 1300  WBC 8.1  RBC 4.59  HGB 12.6*  HCT 40.8  MCV 88.9  MCH 27.5   MCHC 30.9  RDW 15.8*  PLT 265   Cardiac Enzymes Recent Labs  Lab 03/31/23 1758 03/31/23 1953 04/17/23 1300 04/17/23 1452  TROPONINIHS 65* 74* 79* 76*   Lipid Panel     Component Value Date/Time   CHOL 62 07/24/2022 1130   TRIG 60 07/24/2022 1130   HDL 32 (L) 07/24/2022 1130   CHOLHDL 1.9 07/24/2022 1130   VLDL 12 07/24/2022 1130   LDLCALC 18 07/24/2022 1130    Cardiac Studies   Echocardiogram 04/20/2023:  1. Left ventricular ejection fraction, by estimation, is 20 to 25%. The  left ventricle has severely decreased function. The left ventricle  demonstrates regional wall motion abnormalities (see scoring  diagram/findings  for description). The left  ventricular internal cavity size was mildly to moderately dilated. There  is mild concentric left ventricular hypertrophy. Left ventricular  diastolic parameters are consistent with Grade II diastolic dysfunction  (pseudonormalization).   2. Right ventricular systolic function is mildly reduced. The right  ventricular size is normal. Tricuspid regurgitation signal is inadequate  for assessing PA pressure.   3. Left atrial size was mildly dilated.   4. Right atrial size was mildly dilated.   5. The mitral valve is degenerative. Mild mitral valve regurgitation.   6. The aortic valve is tricuspid. Aortic valve regurgitation is not  visualized. No aortic stenosis is present. Aortic valve mean gradient  measures 3.5 mmHg.   7. The inferior vena cava is normal in size with greater than 50%  respiratory variability, suggesting right atrial pressure of 3 mmHg.   Assessment & Plan   1.  HFrEF, acute on chronic.  Follow-up echocardiogram this admission shows LVEF 20 to 25% range with moderate diastolic dysfunction.  He has a nonischemic cardiomyopathy with mild to moderate underlying CAD by cardiac catheterization in September 2023.  GDMT limited by CKD stage IIIb, orthostatic intolerance to Entresto, and recurrent UTIs on SGLT2  inhibitors.  2.  CKD stage IIIb, creatinine 1.9 with GFR 38.  3.  Paroxysmal atrial fibrillation, on Eliquis for stroke prophylaxis.  Currently in sinus rhythm.  4.  Primary hypertension.  Losartan presently held.  Have discontinued IV Lasix.  Patient stable for discharge, we will arrange outpatient follow-up.  Recommend discharge cardiac regimen to include Eliquis 2.5 mg twice daily, Coreg 6.25 mg twice daily, resume Cozaar 25 mg daily starting tomorrow (would not send home on Imdur and hydralazine as I suspect this will not be easy from a compliance perspective).  Change Lasix to 40 mg twice daily with KCl 20 mEq daily.  Initiate Verquvo 2.5 mg daily.  He will need follow-up BMET in about 2 weeks.  For questions or updates, please contact [facility] Please consult [email or web address] for contact info under   Signed, Nona Dell, MD  04/21/2023, 10:17 AM

## 2023-04-21 NOTE — Consult Note (Signed)
 [facility] Liaison Note  04/21/2023  Michael [name] 1955/09/22 [id number]  Location: RN Hospital Liaison screened the patient remotely at [facility].  Insurance: [employer]   [name] is a 68 y.o. male who is a Primary Care Patient of Benita Stabile, MD The patient was screened for 30 day readmission hospitalization with noted extreme risk score for unplanned readmission risk with 6 IP/ 1 ED in 6 months.  The patient was assessed for potential Care Management service needs for post hospital transition for care coordination. Review of patient's electronic medical record reveals patient was admitted for Acute on Chronic combined systolic and diastolic CHF. Liaison attempted outreach call for services and spoke with DPR Ronnald Collum who indicated her number is the only contact for this pt. States she has provided a place for pt to stay but she and her husband are not the caregivers. States no family is available for this pt. Liaison offered post hospital prevention readmission follow up call (receptive) but does not feel pt will make progress due to his ongoing habits of smoking and non-adherent with his daily medications. Pt will d/c home today with self care.  Plan: [facility] Liaison will continue to follow progress and disposition to asess for post hospital community care coordination/management needs.  Referral request for community care coordination: Liaison will make a referral for care coordination services with [facility].   [facility] Care Management/Population Health does not replace or interfere with any arrangements made by the Inpatient Transition of Care team.   For questions contact:   Elliot Cousin, RN, BSN Hospital Liaison [place]   [facility], Population Health Office Hours MTWF  8:00 am-6:00 pm Direct Dial: [phone] mobile [email or web address]

## 2023-04-21 NOTE — Plan of Care (Signed)

## 2023-04-21 NOTE — Telephone Encounter (Signed)
 Patient Product/process development scientist completed.    The patient is insured through [facility]. Patient has Medicare and is not eligible for a copay card, but may be able to apply for patient assistance or Medicare RX Payment Plan (Patient Must reach out to their plan, if eligible for payment plan), if available.    Ran test claim for Verquvo 10 mg and Requires Prior Authorization   This test claim was processed through [employer]- copay amounts may vary at other pharmacies due to [employer], or as the patient moves through the different stages of their insurance plan.     Roland Earl, CPHT Pharmacy Technician III Certified Patient Advocate [facility] Patient Advocate Team Direct Number: [phone]  Fax: [phone]

## 2023-04-21 NOTE — Telephone Encounter (Signed)
 Pharmacy Patient Advocate Encounter  Received notification from [facility] that Prior Authorization for [facility] 2.5MG  tablets has been APPROVED from 04/21/2023 to 01/31/2024. Ran test claim, Copay is $0.00. This test claim was processed through [facility]- copay amounts may vary at other pharmacies due to pharmacy/plan contracts, or as the patient moves through the different stages of their insurance plan.   PA #/Case ID/Reference #: [id number]

## 2023-04-24 ENCOUNTER — Telehealth: Payer: Self-pay

## 2023-04-24 NOTE — Patient Instructions (Signed)
 Visit Information  Thank you for taking time to visit with me today. Please don't hesitate to contact me if I can be of assistance to you.   Call Dr. [name] for Follow up appointment. Follow heart failure action plan and notify physician for changes   If you are experiencing a Mental Health or Behavioral Health Crisis or need someone to talk to, please call the Suicide and Crisis Lifeline: 988   The patient verbalized understanding of instructions, educational materials, and care plan provided today and DECLINED offer to receive copy of patient instructions, educational materials, and care plan.   The patient has been provided with contact information for the care management team and has been advised to call with any health related questions or concerns.   Bary Leriche RN, MSN [facility], [facility] RN Care Manager Direct Dial: [phone]  Fax: [phone] Website: [email or web address]

## 2023-04-24 NOTE — Transitions of Care (Post Inpatient/ED Visit) (Signed)
 04/24/2023  Name: Michael [name] MRN: [id number] DOB: Nov 17, 1955  Today's TOC FU Call Status: Today's TOC FU Call Status:: Successful TOC FU Call Completed TOC FU Call Complete Date: 04/24/23 (Patient declined 30 day TOC program) Patient's Name and Date of Birth confirmed.  Transition Care Management Follow-up Telephone Call Date of Discharge: 04/21/23 [facility] Type of Discharge: Inpatient Admission Primary Inpatient Discharge Diagnosis:: Acute on chronic combined systolic and diastolic CHF How have you been since you were released from the hospital?: Better Any questions or concerns?: No  Items Reviewed: Did you receive and understand the discharge instructions provided?: Yes Medications obtained,verified, and reconciled?: Yes (Medications Reviewed) Any new allergies since your discharge?: No Dietary orders reviewed?: Yes Type of Diet Ordered:: Diet - low sodium heart healthy Do you have support at home?: Yes People in Home: friend(s) Name of Support/Comfort Primary Source: Rene Kocher and Tommy  Medications Reviewed Today: Medications Reviewed Today     Reviewed by Fleeta Emmer, RN (Case Manager) on 04/24/23 at 1011  Med List Status: <None>   Medication Order Taking? Sig Documenting Provider Last Dose Status Informant  apixaban (ELIQUIS) 2.5 MG TABS tablet [id number] Yes Take 1 tablet (2.5 mg total) by mouth 2 (two) times daily. Sherryll Burger, Pratik D, DO Taking Active   atorvastatin (LIPITOR) 40 MG tablet [id number] Yes Take 1 tablet (40 mg total) by mouth every evening.  Patient taking differently: Take 40 mg by mouth daily.   Kendell Bane, MD Taking Active Self, Pharmacy Records  Budeson-Glycopyrrol-Formoterol [facility] AEROSPHERE) 160-9-4.8 MCG/ACT Sandrea Matte [id number] Yes Inhale 2 puffs into the lungs 2 (two) times daily. [provider] Taking Active Self, Pharmacy Records  carvedilol (COREG) 6.25 MG tablet [id number] Yes Take 1 tablet (6.25 mg  total) by mouth 2 (two) times daily with a meal. Tat, David, MD Taking Active Self, Pharmacy Records  furosemide (LASIX) 40 MG tablet [id number] Yes Take 1 tablet (40 mg total) by mouth 2 (two) times daily. Catarina Hartshorn, MD Taking Active Self, Pharmacy Records  losartan (COZAAR) 25 MG tablet [id number] Yes Take 25 mg by mouth daily. [provider] Taking Active Self, Pharmacy Records  nitroGLYCERIN (NITROSTAT) 0.4 MG SL tablet [id number] Yes Place 1 tablet (0.4 mg total) under the tongue every 5 (five) minutes as needed for chest pain. Loetta Rough, MD Taking Active Self, Pharmacy Records           Med Note [facility], [place]   Fri Mar 31, 2023  7:43 PM) Unknown last dose  potassium chloride SA (KLOR-CON M) 20 MEQ tablet [id number] Yes Take 1 tablet (20 mEq total) by mouth daily. Kendell Bane, MD Taking Active Self, Pharmacy Records  Vericiguat [facility]) 2.5 MG TABS [id number] Yes Take 1 tablet (2.5 mg total) by mouth daily. Maurilio Lovely D, DO Taking Active             Home Care and Equipment/Supplies: Were Home Health Services Ordered?: No Any new equipment or medical supplies ordered?: No  Functional Questionnaire: Do you need assistance with bathing/showering or dressing?: No Do you need assistance with meal preparation?: No Do you need assistance with eating?: No Do you have difficulty maintaining continence: No Do you need assistance with getting out of bed/getting out of a chair/moving?: No Do you have difficulty managing or taking your medications?: No  Follow up appointments reviewed: PCP Follow-up appointment confirmed?: No (Patient states he will call Dr. [name] for Follow up appointment today.) MD Provider Line Number:[phone]  Given: No Specialist Hospital Follow-up appointment confirmed?: Yes Date of Specialist follow-up appointment?: 05/01/23 Follow-Up Specialty Provider:: Heart failure clinic Do you need transportation to your follow-up appointment?: No Do  you understand care options if your condition(s) worsen?: Yes-patient verbalized understanding  SDOH Interventions Today    Flowsheet Row Most Recent Value  SDOH Interventions   Food Insecurity Interventions Intervention Not Indicated  Housing Interventions Intervention Not Indicated  Transportation Interventions Intervention Not Indicated  Utilities Interventions Intervention Not Indicated      Spoke with patient.  He reports he is doing better.  He has follow up appointment with HF clinic on 05-01-23. No problems with transportation. Patient states he still drives.  Discussed heart failure and management.  Also discussed when to notify physician. He verbalized understanding.  Offered again TOC 30 day program.  Patient declined again.     Bary Leriche RN, MSN [facility], [facility] RN Care Manager Direct Dial: [phone]  Fax: [phone] Website: [email or web address]

## 2023-04-26 ENCOUNTER — Emergency Department (HOSPITAL_COMMUNITY)
Admission: EM | Admit: 2023-04-26 | Discharge: 2023-04-26 | Disposition: A | Discharge status: Home / Self Care | Attending: Emergency Medicine | Admitting: Emergency Medicine

## 2023-04-26 ENCOUNTER — Emergency Department (HOSPITAL_COMMUNITY)

## 2023-04-26 ENCOUNTER — Other Ambulatory Visit: Payer: Self-pay

## 2023-04-26 ENCOUNTER — Encounter (HOSPITAL_COMMUNITY): Payer: Self-pay

## 2023-04-26 DIAGNOSIS — I719 Aortic aneurysm of unspecified site, without rupture: Secondary | ICD-10-CM | POA: Diagnosis not present

## 2023-04-26 DIAGNOSIS — I509 Heart failure, unspecified: Secondary | ICD-10-CM | POA: Diagnosis not present

## 2023-04-26 DIAGNOSIS — I714 Abdominal aortic aneurysm, without rupture, unspecified: Secondary | ICD-10-CM

## 2023-04-26 DIAGNOSIS — N3000 Acute cystitis without hematuria: Secondary | ICD-10-CM | POA: Diagnosis not present

## 2023-04-26 DIAGNOSIS — I5042 Chronic combined systolic (congestive) and diastolic (congestive) heart failure: Secondary | ICD-10-CM | POA: Insufficient documentation

## 2023-04-26 DIAGNOSIS — K8050 Calculus of bile duct without cholangitis or cholecystitis without obstruction: Secondary | ICD-10-CM | POA: Insufficient documentation

## 2023-04-26 DIAGNOSIS — I517 Cardiomegaly: Secondary | ICD-10-CM | POA: Diagnosis not present

## 2023-04-26 DIAGNOSIS — K8020 Calculus of gallbladder without cholecystitis without obstruction: Secondary | ICD-10-CM | POA: Diagnosis not present

## 2023-04-26 DIAGNOSIS — I2510 Atherosclerotic heart disease of native coronary artery without angina pectoris: Secondary | ICD-10-CM | POA: Diagnosis not present

## 2023-04-26 DIAGNOSIS — R0602 Shortness of breath: Secondary | ICD-10-CM | POA: Diagnosis present

## 2023-04-26 DIAGNOSIS — Z7901 Long term (current) use of anticoagulants: Secondary | ICD-10-CM | POA: Diagnosis not present

## 2023-04-26 DIAGNOSIS — I110 Hypertensive heart disease with heart failure: Secondary | ICD-10-CM | POA: Diagnosis not present

## 2023-04-26 DIAGNOSIS — K838 Other specified diseases of biliary tract: Secondary | ICD-10-CM | POA: Diagnosis not present

## 2023-04-26 DIAGNOSIS — R109 Unspecified abdominal pain: Secondary | ICD-10-CM | POA: Diagnosis not present

## 2023-04-26 DIAGNOSIS — K8080 Other cholelithiasis without obstruction: Secondary | ICD-10-CM

## 2023-04-26 DIAGNOSIS — R1011 Right upper quadrant pain: Secondary | ICD-10-CM | POA: Diagnosis not present

## 2023-04-26 DIAGNOSIS — R0989 Other specified symptoms and signs involving the circulatory and respiratory systems: Secondary | ICD-10-CM | POA: Diagnosis not present

## 2023-04-26 LAB — COMPREHENSIVE METABOLIC PANEL
ALT: 29 U/L (ref 0–44)
AST: 42 U/L — ABNORMAL HIGH (ref 15–41)
Albumin: 3.4 g/dL — ABNORMAL LOW (ref 3.5–5.0)
Alkaline Phosphatase: 187 U/L — ABNORMAL HIGH (ref 38–126)
Anion gap: 11 (ref 5–15)
BUN: 25 mg/dL — ABNORMAL HIGH (ref 8–23)
CO2: 24 mmol/L (ref 22–32)
Calcium: 9.1 mg/dL (ref 8.9–10.3)
Chloride: 105 mmol/L (ref 98–111)
Creatinine, Ser: 1.55 mg/dL — ABNORMAL HIGH (ref 0.61–1.24)
GFR, Estimated: 48 mL/min — ABNORMAL LOW (ref >60)
Glucose, Bld: 119 mg/dL — ABNORMAL HIGH (ref 70–99)
Potassium: 3.4 mmol/L — ABNORMAL LOW (ref 3.5–5.1)
Sodium: 140 mmol/L (ref 135–145)
Total Bilirubin: 1.2 mg/dL (ref 0.0–1.2)
Total Protein: 7.4 g/dL (ref 6.5–8.1)

## 2023-04-26 LAB — CBC WITH DIFFERENTIAL/PLATELET
Abs Immature Granulocytes: 0.02 10*3/uL (ref 0.00–0.07)
Basophils Absolute: 0.1 10*3/uL (ref 0.0–0.1)
Basophils Relative: 1 %
Eosinophils Absolute: 0.2 10*3/uL (ref 0.0–0.5)
Eosinophils Relative: 2 %
HCT: 41.4 % (ref 39.0–52.0)
Hemoglobin: 12.8 g/dL — ABNORMAL LOW (ref 13.0–17.0)
Immature Granulocytes: 0 %
Lymphocytes Relative: 17 %
Lymphs Abs: 1.4 10*3/uL (ref 0.7–4.0)
MCH: 27.9 pg (ref 26.0–34.0)
MCHC: 30.9 g/dL (ref 30.0–36.0)
MCV: 90.2 fL (ref 80.0–100.0)
Monocytes Absolute: 0.6 10*3/uL (ref 0.1–1.0)
Monocytes Relative: 8 %
Neutro Abs: 5.7 10*3/uL (ref 1.7–7.7)
Neutrophils Relative %: 72 %
Platelets: 267 10*3/uL (ref 150–400)
RBC: 4.59 MIL/uL (ref 4.22–5.81)
RDW: 16.4 % — ABNORMAL HIGH (ref 11.5–15.5)
WBC: 8 10*3/uL (ref 4.0–10.5)
nRBC: 0 % (ref 0.0–0.2)

## 2023-04-26 LAB — RESP PANEL BY RT-PCR (RSV, FLU A&B, COVID)  RVPGX2
Influenza A by PCR: NEGATIVE
Influenza B by PCR: NEGATIVE
Resp Syncytial Virus by PCR: NEGATIVE
SARS Coronavirus 2 by RT PCR: NEGATIVE

## 2023-04-26 LAB — URINALYSIS, ROUTINE W REFLEX MICROSCOPIC
Bilirubin Urine: NEGATIVE
Glucose, UA: NEGATIVE mg/dL
Hgb urine dipstick: NEGATIVE
Ketones, ur: NEGATIVE mg/dL
Nitrite: NEGATIVE
Protein, ur: NEGATIVE mg/dL
Specific Gravity, Urine: 1.013 (ref 1.005–1.030)
pH: 6 (ref 5.0–8.0)

## 2023-04-26 LAB — BRAIN NATRIURETIC PEPTIDE: B Natriuretic Peptide: >4500 pg/mL — ABNORMAL HIGH (ref 0.0–100.0)

## 2023-04-26 LAB — TROPONIN I (HIGH SENSITIVITY)
Troponin I (High Sensitivity): 55 ng/L — ABNORMAL HIGH (ref <18)
Troponin I (High Sensitivity): 50 ng/L — ABNORMAL HIGH (ref <18)

## 2023-04-26 MED ORDER — NITROGLYCERIN 2 % TD OINT
1.0000 [in_us] | TOPICAL_OINTMENT | Freq: Once | TRANSDERMAL | Status: AC
Start: 2023-04-26 — End: 2023-04-26
  Administered 2023-04-26: 1 [in_us] via TOPICAL
  Filled 2023-04-26: qty 1

## 2023-04-26 MED ORDER — CEPHALEXIN 500 MG PO CAPS: 500.0000 mg | ORAL_CAPSULE | Freq: Two times a day (BID) | ORAL | 0 refills | Status: AC

## 2023-04-26 MED ORDER — CEFTRIAXONE SODIUM 1 G IJ SOLR
1.0000 g | Freq: Once | INTRAMUSCULAR | Status: AC
  Administered 2023-04-26: 1 g via INTRAMUSCULAR
  Filled 2023-04-26: qty 10

## 2023-04-26 MED ORDER — IPRATROPIUM-ALBUTEROL 0.5-2.5 (3) MG/3ML IN SOLN
3.0000 mL | Freq: Once | RESPIRATORY_TRACT | Status: AC
  Administered 2023-04-26: 3 mL via RESPIRATORY_TRACT
  Filled 2023-04-26: qty 3

## 2023-04-26 MED ORDER — IOHEXOL 350 MG/ML SOLN
75.0000 mL | Freq: Once | INTRAVENOUS | Status: AC | PRN
  Administered 2023-04-26: 75 mL via INTRAVENOUS

## 2023-04-26 MED ORDER — FUROSEMIDE 10 MG/ML IJ SOLN
20.0000 mg | Freq: Once | INTRAMUSCULAR | Status: AC
  Administered 2023-04-26: 20 mg via INTRAVENOUS
  Filled 2023-04-26: qty 2

## 2023-04-26 MED ORDER — SODIUM CHLORIDE 0.9 % IV SOLN: 1.0000 g | Freq: Once | INTRAVENOUS | Status: DC

## 2023-04-26 MED ORDER — MORPHINE SULFATE (PF) 4 MG/ML IV SOLN
4.0000 mg | Freq: Once | INTRAVENOUS | Status: AC
  Administered 2023-04-26: 4 mg via INTRAVENOUS
  Filled 2023-04-26: qty 1

## 2023-04-26 NOTE — Consult Note (Signed)
 Patient Demographics  Lijah Bourque, is a 68 y.o. male   MRN: [id number]   DOB - [birth year]  Admit Date - 04/26/2023    Outpatient Primary MD for the patient is Benita Stabile, MD  Consult requested in the Hospital by Derwood Kaplan, MD, On 04/26/2023    Reason for consult: To evaluate if admission is needed   With History of -  Past Medical History:  Diagnosis Date   Aortic atherosclerosis (HCC) 08/03/2021   Atrial fibrillation (HCC)    CAD (coronary artery disease) 08/03/2021   Cardiac catheterization September 2023 with RCA/RV marginal stenosis managed medically   Cardiomyopathy (HCC)    Carotid artery disease (HCC)    CKD (chronic kidney disease) stage 3, GFR 30-59 ml/min (HCC)    COPD (chronic obstructive pulmonary disease) (HCC)    Essential hypertension    Head and neck cancer (HCC) 2019   Right facial basal cell carcinoma s/p resection with right partial mastectomy and partal rhinectomy with skin graft (06/13/17)   Iron deficiency anemia    Mass of colon 08/06/2021   Formatting of this note might be different from the original. Last Assessment & Plan:  Formatting of this note might be different from the original.  Status post right hemicolectomy by Dr. Henreitta Leber today 7/12.    -Will check postop CBC and BMP     Urinary retention       Past Surgical History:  Procedure Laterality Date   ANKLE CLOSED REDUCTION Right    open reduction   BASAL CELL CARCINOMA EXCISION  2019   at [facility]   BIOPSY  07/19/2021   Procedure: BIOPSY;  Surgeon: Lanelle Bal, DO;  [facility]: [facility];  Service: Endoscopy;;   CATARACT EXTRACTION W/PHACO Left 01/23/2023   Procedure: CATARACT EXTRACTION PHACO AND INTRAOCULAR LENS PLACEMENT (IOC);  Surgeon: [name], MD;  Location: [facility];  Service: Ophthalmology;  Laterality: Left;  CDE: 23.26   COLONOSCOPY N/A 05/31/2017   Procedure: COLONOSCOPY;   Surgeon: Corbin Ade, MD;  Location: [facility];  Service: Endoscopy;  Laterality: N/A;  2:45pm   COLONOSCOPY WITH PROPOFOL N/A 07/19/2021   Procedure: COLONOSCOPY WITH PROPOFOL;  Surgeon: Lanelle Bal, DO;  Location: [facility];  Service: Endoscopy;  Laterality: N/A;  3:00pm, moved up to 9:00   CYSTOSCOPY N/A 10/29/2018   Procedure: CYSTOSCOPY, CLOT EVACUATION;  Surgeon: Rene Paci, MD;  Location: [facility];  Service: Urology;  Laterality: N/A;   PARTIAL COLECTOMY Right 08/11/2021   Procedure: PARTIAL COLECTOMY, OPEN RIGHT HEMICOLECTOMY;  Surgeon: Lucretia Roers, MD;  Location: [facility];  Service: General;  Laterality: Right;   POLYPECTOMY  05/31/2017   Procedure: POLYPECTOMY;  Surgeon: Corbin Ade, MD;  [facility]: [facility];  Service: Endoscopy;;   RIGHT/LEFT HEART CATH AND CORONARY ANGIOGRAPHY N/A 10/22/2021   Procedure: RIGHT/LEFT HEART CATH AND CORONARY ANGIOGRAPHY;  Surgeon: Orbie Pyo, MD;  Location: [facility];  Service: Cardiovascular;  Laterality: N/A;  TONSILLECTOMY     TRANSCAROTID ARTERY REVASCULARIZATION  Right 02/15/2021   Procedure: RIGHT TRANSCAROTID ARTERY REVASCULARIZATION;  Surgeon: Cephus Shelling, MD;  Location: [facility] OR;  Service: Vascular;  Laterality: Right;   ULTRASOUND GUIDANCE FOR VASCULAR ACCESS Left 02/15/2021   Procedure: ULTRASOUND GUIDANCE FOR VASCULAR ACCESS;  Surgeon: Cephus Shelling, MD;  Location: [facility] OR;  Service: Vascular;  Laterality: Left;   XI ROBOTIC ASSISTED SIMPLE PROSTATECTOMY N/A 10/29/2018   Procedure: XI ROBOTIC ASSISTED SIMPLE PROSTATECTOMY;  Surgeon: Malen Gauze, MD;  Location: [facility];  Service: Urology;  Laterality: N/A;    in for   Chief Complaint  Patient presents with   Shortness of Breath     HPI  Chevon Laufer  is a 68 y.o. male,  with PMH of HFrEF, orthostatic hypotension, recurrent syncope, CKD-3, CAD, COPD, BCC s/p thyroidectomy and maxillectomy in 2019 at [facility], right  carotid stenosis, HTN and tobacco use, patient with multiple admissions in the past, with known to our service, due to acute on chronic systolic CHF, 6 admissions over last 76-month, recently discharged on 04/21/2023. -Presents to ED secondary to multiple complaints, he does endorse creased work of breathing, dyspnea, and some right-sided chest pain/abdominal pain, nausea, no vomiting, no dizziness, no syncope, ED his workup significant for CTA chest negative for PE, CT abdomen significant for large gallstone, but no evidence of cholecystitis, hospitalist consulted to evaluate for admission is needed.   Review of Systems      A full 10 point Review of Systems was done, except as stated above, all other Review of Systems were negative.   Social History Social History   Tobacco Use   Smoking status: Former    Current packs/day: 0.00    Average packs/day: 0.3 packs/day for 40.0 years (10.0 ttl pk-yrs)    Types: Cigarettes    Start date: 07/02/1981    Quit date: 07/02/2021    Years since quitting: 1.8    Passive exposure: Past   Smokeless tobacco: Never   Tobacco comments:    Quit on 06/09/21  Substance Use Topics   Alcohol use: Not Currently   Family History Family History  Problem Relation Age of Onset   Stroke Father    Cirrhosis Mother    Colon cancer Neg Hx      Prior to Admission medications   Medication Sig Start Date End Date Taking? Authorizing Provider  apixaban (ELIQUIS) 2.5 MG TABS tablet Take 1 tablet (2.5 mg total) by mouth 2 (two) times daily. 04/21/23   Sherryll Burger, Pratik D, DO  atorvastatin (LIPITOR) 40 MG tablet Take 1 tablet (40 mg total) by mouth every evening. Patient taking differently: Take 40 mg by mouth daily. 08/01/22 02/05/24  ShahmehdiGemma Payor, MD  Budeson-Glycopyrrol-Formoterol (BREZTRI AEROSPHERE) 160-9-4.8 MCG/ACT AERO Inhale 2 puffs into the lungs 2 (two) times daily.    [provider]  carvedilol (COREG) 6.25 MG tablet Take 1 tablet (6.25 mg total) by  mouth 2 (two) times daily with a meal. 04/06/23   Tat, Onalee Hua, MD  furosemide (LASIX) 40 MG tablet Take 1 tablet (40 mg total) by mouth 2 (two) times daily. 04/07/23   Catarina Hartshorn, MD  losartan (COZAAR) 25 MG tablet Take 25 mg by mouth daily. 12/23/22   [provider]  nitroGLYCERIN (NITROSTAT) 0.4 MG SL tablet Place 1 tablet (0.4 mg total) under the tongue every 5 (five) minutes as needed for chest pain. 02/05/23   Loetta Rough, MD  potassium chloride SA (  KLOR-CON M) 20 MEQ tablet Take 1 tablet (20 mEq total) by mouth daily. 07/26/22 02/05/24  Shahmehdi, Gemma Payor, MD  Vericiguat (VERQUVO) 2.5 MG TABS Take 1 tablet (2.5 mg total) by mouth daily. 04/21/23   Sherryll Burger, Pratik D, DO    Anti-infectives (From admission, onward)    None       Scheduled Meds: Continuous Infusions: PRN Meds:.  No Known Allergies  Physical Exam  Vitals  Blood pressure (!) 158/107, pulse 83, temperature 97.9 F (36.6 C), temperature source Oral, resp. rate 20, height 6' (1.829 m), weight 57.7 kg, SpO2 98%.   1. General, frail, chronically appearing male, laying in bed, in no apparent distress  2. Normal affect and insight, Not Suicidal or Homicidal, Awake Alert, Oriented X 3.  3. No F.N deficits, ALL C.Nerves Intact, Strength 5/5 all 4 extremities, Sensation intact all 4 extremities, Plantars down going.  4.  Patient with postsurgical facial changes status post rhinectomy  5. Supple Neck, no JVD noted,   6. Symmetrical Chest wall movement, Good air movement bilaterally, wheezing, some Rales at the bases  7. RRR, No Gallops, Rubs or Murmurs, No Parasternal Heave.  Trace edema  8. Positive Bowel Sounds, Abdomen Soft, No tenderness, No organomegaly appriciated,No rebound -guarding or rigidity.  9.  No Cyanosis, Normal Skin Turgor, No Skin Rash or Bruise.  10. Good muscle tone,  joints appear normal , no effusions, Normal ROM.  Data Review  CBC Recent Labs  Lab 04/26/23 1209  WBC 8.0  HGB 12.8*   HCT 41.4  PLT 267  MCV 90.2  MCH 27.9  MCHC 30.9  RDW 16.4*  LYMPHSABS 1.4  MONOABS 0.6  EOSABS 0.2  BASOSABS 0.1   ------------------------------------------------------------------------------------------------------------------  Chemistries  Recent Labs  Lab 04/20/23 0503 04/21/23 0454 04/26/23 1209  NA 140 139 140  K 3.2* 3.4* 3.4*  CL 105 104 105  CO2 26 26 24   GLUCOSE 115* 108* 119*  BUN 30* 34* 25*  CREATININE 1.80* 1.90* 1.55*  CALCIUM 8.8* 8.4* 9.1  MG 1.8 2.0  --   AST  --   --  42*  ALT  --   --  29  ALKPHOS  --   --  187*  BILITOT  --   --  1.2   ------------------------------------------------------------------------------------------------------------------ estimated creatinine clearance is 37.2 mL/min (A) (by C-G formula based on SCr of 1.55 mg/dL (H)). ------------------------------------------------------------------------------------------------------------------ No results for input(s): "TSH", "T4TOTAL", "T3FREE", "THYROIDAB" in the last 72 hours.  Invalid input(s): "FREET3"   Coagulation profile No results for input(s): "INR", "PROTIME" in the last 168 hours. ------------------------------------------------------------------------------------------------------------------- No results for input(s): "DDIMER" in the last 72 hours. -------------------------------------------------------------------------------------------------------------------  Cardiac Enzymes No results for input(s): "CKMB", "TROPONINI", "MYOGLOBIN" in the last 168 hours.  Invalid input(s): "CK" ------------------------------------------------------------------------------------------------------------------ Invalid input(s): "POCBNP"   ---------------------------------------------------------------------------------------------------------------  Urinalysis    Component Value Date/Time   COLORURINE YELLOW 11/21/2022 2100   APPEARANCEUR CLEAR 11/21/2022 2100    APPEARANCEUR Cloudy (A) 06/16/2021 0936   LABSPEC 1.017 11/21/2022 2100   LABSPEC 1.018 04/23/2011 1044   PHURINE 5.0 11/21/2022 2100   GLUCOSEU NEGATIVE 11/21/2022 2100   GLUCOSEU Negative 04/23/2011 1044   HGBUR NEGATIVE 11/21/2022 2100   BILIRUBINUR NEGATIVE 11/21/2022 2100   BILIRUBINUR Negative 06/16/2021 0936   BILIRUBINUR Negative 04/23/2011 1044   KETONESUR NEGATIVE 11/21/2022 2100   PROTEINUR 100 (A) 11/21/2022 2100   NITRITE NEGATIVE 11/21/2022 2100   LEUKOCYTESUR NEGATIVE 11/21/2022 2100   LEUKOCYTESUR Trace 04/23/2011 1044  Imaging results:   US Abdomen Limited RUQ (LIVER/GB) Result Date: 04/26/2023 CLINICAL DATA:  [id number] Abdominal pain, acute, right upper quadrant [id number] EXAM: ULTRASOUND ABDOMEN LIMITED RIGHT UPPER QUADRANT COMPARISON:  CT abdomen pelvis 04/26/2023 FINDINGS: Gallbladder: Calcified stone within the gallbladder lumen. Contracted gallbladder with limited evaluation of the gallbladder wall. No definite pericholecystic fluid. No sonographic Murphy sign noted by sonographer. Common bile duct: Diameter: 15 mm Liver: No focal lesion identified. Within normal limits in parenchymal echogenicity. Portal vein is patent on color Doppler imaging with normal direction of blood flow towards the liver. Other: None. IMPRESSION: 1. Cholelithiasis with limited evaluation of the gallbladder wall in the setting of a contracted gallbladder. 2. Common bile duct dilatation. No ultrasound evidence of choledocholithiasis within the common bile duct not fully visualized. Electronically Signed   By: Tish Frederickson M.D.   On: 04/26/2023 17:18   CT Angio Chest PE W/Cm &/Or Wo Cm Result Date: 04/26/2023 CLINICAL DATA:  Pulmonary embolism (PE) suspected, high prob; RLQ abdominal pain EXAM: CT ANGIOGRAPHY CHEST CT ABDOMEN AND PELVIS WITH CONTRAST TECHNIQUE: Multidetector CT imaging of the chest was performed using the standard protocol during bolus administration of intravenous contrast.  Multiplanar CT image reconstructions and MIPs were obtained to evaluate the vascular anatomy. Multidetector CT imaging of the abdomen and pelvis was performed using the standard protocol during bolus administration of intravenous contrast. RADIATION DOSE REDUCTION: This exam was performed according to the departmental dose-optimization program which includes automated exposure control, adjustment of the mA and/or kV according to patient size and/or use of iterative reconstruction technique. CONTRAST:  75mL OMNIPAQUE IOHEXOL 350 MG/ML SOLN COMPARISON:  CT angiography chest from 02/21/2023 and CT scan abdomen and pelvis from 10/20/2021. FINDINGS: CTA CHEST FINDINGS Cardiovascular: No evidence of embolism to the proximal subsegmental pulmonary artery level. There is dilation of the main pulmonary trunk measuring up to 3.5 cm, which is nonspecific but can be seen with pulmonary artery hypertension. Mild cardiomegaly. No pericardial effusion. No aortic aneurysm. There are coronary artery calcifications, in keeping with coronary artery disease. There are also moderate peripheral atherosclerotic vascular calcifications of thoracic aorta and its major branches. Mediastinum/Nodes: Visualized thyroid gland appears grossly unremarkable. No solid / cystic mediastinal masses. The esophagus is nondistended precluding optimal assessment. No axillary, mediastinal or hilar lymphadenopathy by size criteria. Lungs/Pleura: The central tracheo-bronchial tree is patent. There are patchy areas of linear, plate-like atelectasis and/or scarring throughout bilateral lungs. There are multiple scattered 4 mm or smaller calcified granulomas throughout bilateral lungs, nonspecific but likely sequela of prior granulomatous infection. No suspicious lung nodule. No mass or consolidation. No pleural effusion or pneumothorax. No suspicious lung nodules. Musculoskeletal: The visualized soft tissues of the chest wall are grossly unremarkable. No  suspicious osseous lesions. There are mild multilevel degenerative changes in the visualized spine. Review of the MIP images confirms the above findings. CT ABDOMEN and PELVIS FINDINGS Hepatobiliary: The liver is normal in size. Noncirrhotic configuration. However, there is diffuse heterogeneity of the liver, which is nonspecific. There is a stable several simple cyst measuring 9 x 14 mm. No intrahepatic or extrahepatic bile duct dilation. Redemonstration of a large peripherally rim calcified gallstone. The gallbladder is contracted around the gallstones. There is mild-to-moderate diffuse circumferential wall thickening. No pericholecystic fat stranding. Findings are nonspecific and may represent chronic cholecystitis. However, correlate clinically to exclude superimposed acute cholecystitis. Pancreas: Redemonstration of marked atrophic pancreas containing multiple dystrophic calcifications, suggesting sequela of chronic pancreatitis. Spleen: Within normal limits. No focal lesion.  Adrenals/Urinary Tract: Adrenal glands are unremarkable. Small/atrophic bilateral kidneys noted. There is persistent fetal lobulations and probable superimposed patchy areas of scarring throughout bilateral kidneys. No nephroureterolithiasis or obstructive uropathy. Urine bladder is distended. There is mild asymmetric bladder wall thickening without perivesical fat stranding. Findings may be due to chronic versus acute cystitis. Correlate clinically and with urinalysis. No focal mass or bladder calculi seen. Stomach/Bowel: Patient is status post right hemicolectomy with ileocolonic anastomosis in the right paramedian mid abdomen. No disproportionate dilation of the small or large bowel loops. No evidence of abnormal bowel wall thickening or inflammatory changes. Vascular/Lymphatic: No ascites or pneumoperitoneum. No abdominal or pelvic lymphadenopathy, by size criteria. Redemonstration of saccular aneurysmal dilation of infrarenal aorta  measuring up to 3.9 x 4.1 cm, increased since the prior study. There is peripheral eccentric intraluminal versus intramural thrombus/hematoma. There is also a smaller saccular dilation of aorta just above the bifurcation. There are also 2 focal saccular aneurysms in the right common iliac artery with largest measuring up to 2.3 cm, grossly similar to the prior study. No other aneurysmal dilation of the major abdominal arteries. Reproductive: Post TURP defect noted. Prostate is moderately enlarged. Other: Supraumbilical midline surgical scar noted. There is small right inguinal hernia containing fat. The soft tissues and abdominal wall are otherwise unremarkable. Musculoskeletal: No suspicious osseous lesions. There are mild multilevel degenerative changes in the visualized spine. Review of the MIP images confirms the above findings. IMPRESSION: 1. No embolism to the proximal subsegmental pulmonary artery level. No lung mass, consolidation, pleural effusion or pneumothorax. 2. 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 Vascular surgery consultation is recommended. 3. There is diffuse heterogeneity of the liver, which is nonspecific. 4. There is a large gallstone with contracted gallbladder and mild-to-moderate diffuse circumferential wall thickening. No pericholecystic fat stranding. Findings are nonspecific and may represent chronic cholecystitis. However, correlate clinically to exclude superimposed acute cholecystitis. 5. There is mild asymmetric urinary bladder wall thickening without perivesical fat stranding. Findings may be due to chronic versus acute cystitis. Correlate clinically and with urinalysis. 6. Multiple other nonacute observations, as described above. Aortic Atherosclerosis (ICD10-I70.0). Aortic aneurysm NOS (ICD10-I71.9). Electronically Signed   By: Jules Schick M.D.   On: 04/26/2023 15:08   CT ABDOMEN PELVIS W CONTRAST Result Date: 04/26/2023 CLINICAL  DATA:  Pulmonary embolism (PE) suspected, high prob; RLQ abdominal pain EXAM: CT ANGIOGRAPHY CHEST CT ABDOMEN AND PELVIS WITH CONTRAST TECHNIQUE: Multidetector CT imaging of the chest was performed using the standard protocol during bolus administration of intravenous contrast. Multiplanar CT image reconstructions and MIPs were obtained to evaluate the vascular anatomy. Multidetector CT imaging of the abdomen and pelvis was performed using the standard protocol during bolus administration of intravenous contrast. RADIATION DOSE REDUCTION: This exam was performed according to the departmental dose-optimization program which includes automated exposure control, adjustment of the mA and/or kV according to patient size and/or use of iterative reconstruction technique. CONTRAST:  75mL OMNIPAQUE IOHEXOL 350 MG/ML SOLN COMPARISON:  CT angiography chest from 02/21/2023 and CT scan abdomen and pelvis from 10/20/2021. FINDINGS: CTA CHEST FINDINGS Cardiovascular: No evidence of embolism to the proximal subsegmental pulmonary artery level. There is dilation of the main pulmonary trunk measuring up to 3.5 cm, which is nonspecific but can be seen with pulmonary artery hypertension. Mild cardiomegaly. No pericardial effusion. No aortic aneurysm. There are coronary artery calcifications, in keeping with coronary artery disease. There are also moderate peripheral atherosclerotic vascular calcifications of thoracic aorta and its  major branches. Mediastinum/Nodes: Visualized thyroid gland appears grossly unremarkable. No solid / cystic mediastinal masses. The esophagus is nondistended precluding optimal assessment. No axillary, mediastinal or hilar lymphadenopathy by size criteria. Lungs/Pleura: The central tracheo-bronchial tree is patent. There are patchy areas of linear, plate-like atelectasis and/or scarring throughout bilateral lungs. There are multiple scattered 4 mm or smaller calcified granulomas throughout bilateral lungs,  nonspecific but likely sequela of prior granulomatous infection. No suspicious lung nodule. No mass or consolidation. No pleural effusion or pneumothorax. No suspicious lung nodules. Musculoskeletal: The visualized soft tissues of the chest wall are grossly unremarkable. No suspicious osseous lesions. There are mild multilevel degenerative changes in the visualized spine. Review of the MIP images confirms the above findings. CT ABDOMEN and PELVIS FINDINGS Hepatobiliary: The liver is normal in size. Noncirrhotic configuration. However, there is diffuse heterogeneity of the liver, which is nonspecific. There is a stable several simple cyst measuring 9 x 14 mm. No intrahepatic or extrahepatic bile duct dilation. Redemonstration of a large peripherally rim calcified gallstone. The gallbladder is contracted around the gallstones. There is mild-to-moderate diffuse circumferential wall thickening. No pericholecystic fat stranding. Findings are nonspecific and may represent chronic cholecystitis. However, correlate clinically to exclude superimposed acute cholecystitis. Pancreas: Redemonstration of marked atrophic pancreas containing multiple dystrophic calcifications, suggesting sequela of chronic pancreatitis. Spleen: Within normal limits. No focal lesion. Adrenals/Urinary Tract: Adrenal glands are unremarkable. Small/atrophic bilateral kidneys noted. There is persistent fetal lobulations and probable superimposed patchy areas of scarring throughout bilateral kidneys. No nephroureterolithiasis or obstructive uropathy. Urine bladder is distended. There is mild asymmetric bladder wall thickening without perivesical fat stranding. Findings may be due to chronic versus acute cystitis. Correlate clinically and with urinalysis. No focal mass or bladder calculi seen. Stomach/Bowel: Patient is status post right hemicolectomy with ileocolonic anastomosis in the right paramedian mid abdomen. No disproportionate dilation of the  small or large bowel loops. No evidence of abnormal bowel wall thickening or inflammatory changes. Vascular/Lymphatic: No ascites or pneumoperitoneum. No abdominal or pelvic lymphadenopathy, by size criteria. Redemonstration of saccular aneurysmal dilation of infrarenal aorta measuring up to 3.9 x 4.1 cm, increased since the prior study. There is peripheral eccentric intraluminal versus intramural thrombus/hematoma. There is also a smaller saccular dilation of aorta just above the bifurcation. There are also 2 focal saccular aneurysms in the right common iliac artery with largest measuring up to 2.3 cm, grossly similar to the prior study. No other aneurysmal dilation of the major abdominal arteries. Reproductive: Post TURP defect noted. Prostate is moderately enlarged. Other: Supraumbilical midline surgical scar noted. There is small right inguinal hernia containing fat. The soft tissues and abdominal wall are otherwise unremarkable. Musculoskeletal: No suspicious osseous lesions. There are mild multilevel degenerative changes in the visualized spine. Review of the MIP images confirms the above findings. IMPRESSION: 1. No embolism to the proximal subsegmental pulmonary artery level. No lung mass, consolidation, pleural effusion or pneumothorax. 2. 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 Vascular surgery consultation is recommended. 3. There is diffuse heterogeneity of the liver, which is nonspecific. 4. There is a large gallstone with contracted gallbladder and mild-to-moderate diffuse circumferential wall thickening. No pericholecystic fat stranding. Findings are nonspecific and may represent chronic cholecystitis. However, correlate clinically to exclude superimposed acute cholecystitis. 5. There is mild asymmetric urinary bladder wall thickening without perivesical fat stranding. Findings may be due to chronic versus acute cystitis. Correlate clinically and with urinalysis.  6. Multiple other nonacute observations, as described above. Aortic  Atherosclerosis (ICD10-I70.0). Aortic aneurysm NOS (ICD10-I71.9). Electronically Signed   By: Jules Schick M.D.   On: 04/26/2023 15:08   DG Chest Port 1 View Result Date: 04/26/2023 CLINICAL DATA:  Shortness of breath. EXAM: PORTABLE CHEST 1 VIEW COMPARISON:  Chest radiograph dated 04/17/2023 FINDINGS: There is cardiomegaly with vascular congestion. No focal consolidation or pneumothorax. Small left pleural effusion suspected. Atherosclerotic calcification of the aorta. No acute osseous pathology. IMPRESSION: Cardiomegaly with vascular congestion. Electronically Signed   By: Elgie Collard M.D.   On: 04/26/2023 13:48     EKG:   Vent. rate 79 BPM PR interval 295 ms QRS duration 133 ms QT/QTcB 431/495 ms P-R-T axes -82 50 106 Age not entered, assumed to be 68 years old for purpose of ECG interpretation Sinus or ectopic atrial rhythm Prolonged PR interval LVH with secondary repolarization abnormality Anterior infarct, old  Assessment & Plan  Active Problems:   Chronic combined systolic and diastolic CHF (congestive heart failure) (HCC)   Chronic combined systolic and diastolic CHF -Patient with known history of chronic combined systolic and diastolic CHF, most recent echo showingLVEF 20 to 25% range with moderate diastolic dysfunction. He has a nonischemic cardiomyopathy with mild to moderate underlying CAD by cardiac catheterization in September 2023. GDMT limited by CKD stage IIIb, orthostatic intolerance to Entresto, and recurrent UTIs on SGLT2 inhibitors.  -Patient with chronic dyspnea, he is saturating 100% on room air, in no apparent respiratory distress, currently elevated BNP, there is no evidence of JVD, or lower extremity edema, status appears to be at baseline, he received 20 mg of IV Lasix in ED, he was instructed to continue with home dose Lasix. -Commend to continue his cardiac regimen on discharge  including Eliquis, Coreg and Cozaar, home dose Lasix of 40 mg twice daily and Verquvo. -Reports of chest pain, currently resolved upon my evaluation, CTA chest negative for PE, EKG is reassuring, troponins at baseline and downtrending.   Cholelithiasis -With known history of cholelithiasis, LFTs are stable, no right upper quadrant tenderness on physical exam, he is following with Dr. Lovell Sheehan, this point there is no indication for emergent surgery evidence of acute cholecystitis or cholelithiasis. -Instructed to follow-up with his primary surgeon as an outpatient. -Pain may be related to cholelithiasis, but overall he is high risk for surgery, he was instructed to follow-up with Dr. Lovell Sheehan.  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  -Vascular surgery consultation is recommended.  Jent is following with Dr. Darrick Penna for known history of carotid artery stenosis, ambulatory referral has been sent to vascular surgery.  Tobacco abuse -Reports he cut drastically, he was counseled to stop smoking -Patient with known history of COPD, and I have told him that continued smoking will increase his feeling of dyspnea.  Patient can be discharged home, commendation to continue his home cardiac regimen.    Huey Bienenstock M.D on 04/26/2023 at 6:06 PM     [facility]   Office  [phone]

## 2023-04-26 NOTE — ED Triage Notes (Signed)
 Pt arrived via POV c/o recurring SOB. Pt reports symptoms began apprx 6hrs PTA. Pt reports being seen here recently for same complaint.

## 2023-04-26 NOTE — ED Notes (Signed)
 Pt continues to appear to be short of breath. O2 sat remains 99% on room air. IV lasix administered. Call light within reach, side rails up x2, bed locked and in lowest position.

## 2023-04-26 NOTE — ED Provider Notes (Signed)
 [place] EMERGENCY DEPARTMENT AT [facility] Provider Note   CSN: [id number] Arrival date & time: 04/26/23  1139     History  Chief Complaint  Patient presents with   Shortness of Breath    Jeremi Losito is a 68 y.o. male.  Patient is a 68 year old male with a past medical history of CHF who presents to the emergency department with a chief complaint of increased shortness of breath for approximate the past 6 hours as well as associated right-sided chest pain and right-sided abdominal pain.  Patient notes that he has had no associated nausea, vomiting, diarrhea.  He denies any dizziness, lightheadedness or syncope.  He denies any numbness, paresthesias or unilateral weakness.  He notes that he has been compliant with all of his home medications including his diuretics.   Shortness of Breath Associated symptoms: abdominal pain        Home Medications Prior to Admission medications   Medication Sig Start Date End Date Taking? Authorizing Provider  apixaban (ELIQUIS) 2.5 MG TABS tablet Take 1 tablet (2.5 mg total) by mouth 2 (two) times daily. 04/21/23   Sherryll Burger, Pratik D, DO  atorvastatin (LIPITOR) 40 MG tablet Take 1 tablet (40 mg total) by mouth every evening. Patient taking differently: Take 40 mg by mouth daily. 08/01/22 02/05/24  ShahmehdiGemma Payor, MD  Budeson-Glycopyrrol-Formoterol (BREZTRI AEROSPHERE) 160-9-4.8 MCG/ACT AERO Inhale 2 puffs into the lungs 2 (two) times daily.    [provider]  carvedilol (COREG) 6.25 MG tablet Take 1 tablet (6.25 mg total) by mouth 2 (two) times daily with a meal. 04/06/23   Tat, Onalee Hua, MD  furosemide (LASIX) 40 MG tablet Take 1 tablet (40 mg total) by mouth 2 (two) times daily. 04/07/23   Catarina Hartshorn, MD  losartan (COZAAR) 25 MG tablet Take 25 mg by mouth daily. 12/23/22   [provider]  nitroGLYCERIN (NITROSTAT) 0.4 MG SL tablet Place 1 tablet (0.4 mg total) under the tongue every 5 (five) minutes as needed for  chest pain. 02/05/23   Loetta Rough, MD  potassium chloride SA (KLOR-CON M) 20 MEQ tablet Take 1 tablet (20 mEq total) by mouth daily. 07/26/22 02/05/24  Shahmehdi, Gemma Payor, MD  Vericiguat (VERQUVO) 2.5 MG TABS Take 1 tablet (2.5 mg total) by mouth daily. 04/21/23   Maurilio Lovely D, DO      Allergies    Patient has no known allergies.    Review of Systems   Review of Systems  Respiratory:  Positive for shortness of breath.   Gastrointestinal:  Positive for abdominal pain.  All other systems reviewed and are negative.   Physical Exam Updated Vital Signs BP (!) 176/102 (BP Location: Right Arm)   Pulse 77   Temp (!) 97.5 F (36.4 C) (Temporal)   Resp 20   Ht 6' (1.829 m)   Wt 57.7 kg   SpO2 100%   BMI 17.25 kg/m  Physical Exam Vitals and nursing note reviewed.  Constitutional:      Appearance: Normal appearance.  HENT:     Head: Normocephalic and atraumatic.     Nose: Nose normal.     Mouth/Throat:     Mouth: Mucous membranes are moist.  Eyes:     Extraocular Movements: Extraocular movements intact.     Conjunctiva/sclera: Conjunctivae normal.     Pupils: Pupils are equal, round, and reactive to light.  Cardiovascular:     Rate and Rhythm: Normal rate and regular rhythm.  Pulses: Normal pulses.     Heart sounds: Normal heart sounds.  Pulmonary:     Effort: Pulmonary effort is normal.     Breath sounds: Normal breath sounds. No decreased breath sounds, wheezing, rhonchi or rales.  Chest:     Chest wall: No tenderness.  Abdominal:     General: Abdomen is flat. Bowel sounds are normal.     Palpations: Abdomen is soft. There is no hepatomegaly or mass.     Tenderness: There is abdominal tenderness.     Comments: Patient to right side of abdomen  Musculoskeletal:        General: Normal range of motion.     Cervical back: Normal range of motion and neck supple.     Right lower leg: No edema.     Left lower leg: No edema.  Skin:    General: Skin is warm and dry.      Findings: No rash.  Neurological:     General: No focal deficit present.     Mental Status: He is alert and oriented to person, place, and time. Mental status is at baseline.     Cranial Nerves: No cranial nerve deficit.     Motor: No weakness.  Psychiatric:        Mood and Affect: Mood normal.        Behavior: Behavior normal.        Thought Content: Thought content normal.        Judgment: Judgment normal.     ED Results / Procedures / Treatments   Labs (all labs ordered are listed, but only abnormal results are displayed) Labs Reviewed  RESP PANEL BY RT-PCR (RSV, FLU A&B, COVID)  RVPGX2  BRAIN NATRIURETIC PEPTIDE  COMPREHENSIVE METABOLIC PANEL  CBC WITH DIFFERENTIAL/PLATELET  TROPONIN I (HIGH SENSITIVITY)    EKG None  Radiology No results found.  Procedures Procedures    Medications Ordered in ED Medications  morphine (PF) 4 MG/ML injection 4 mg (has no administration in time range)  nitroGLYCERIN (NITROGLYN) 2 % ointment 1 inch (has no administration in time range)    ED Course/ Medical Decision Making/ A&P                                 Medical Decision Making Amount and/or Complexity of Data Reviewed Labs: ordered. Radiology: ordered.  Risk Prescription drug management.   This patient presents to the ED for concern of ornis of breath, right-sided chest and flank pain differential diagnosis includes acute CHF, pneumonia, pulmonary embolus, ACS, cholelithiasis, choledocholithiasis, acute cholecystitis, appendicitis, pyelonephritis, kidney stone    Additional history obtained:  Additional history obtained from medical records External records from outside source obtained and reviewed including none   Lab Tests:  I Ordered, and personally interpreted labs.  The pertinent results include: Elevated BNP, elevated troponin, elevated creatinine, mild anemia   Imaging Studies ordered:  I ordered imaging studies including CT scan of chest, abdomen  and pelvis, right upper quadrant ultrasound I independently visualized and interpreted imaging which showed cholelithiasis without signs of acute cholecystitis, mild enlargement of the common bile duct, no pulmonary embolus, abdominal aortic aneurysm I agree with the radiologist interpretation   Medicines ordered and prescription drug management:  I ordered medication including Lasix, Nitropaste, morphine for acute CHF, right-sided flank pain Reevaluation of the patient after these medicines showed that the patient improved I have reviewed the patients home medicines and  have made adjustments as needed   Problem List / ED Course:  Patient does remain stable at this time.  Original plan was to admit the patient to the hospital service for CHF exacerbation.  Patient was evaluated by Dr. Randol Kern who does note that patient is stable for discharge home at this time as there is nothing further that can be offered by the inpatient team.  Patient does have stable vital signs at this point with no indication for sepsis and no associated hypoxia.  Ultrasound demonstrated cholelithiasis without any indication for acute cholecystitis.  Patient is completely nontender palpation over the right upper quadrant.  Did discuss CT scan findings with Dr. Myra Gianotti with vascular surgery regarding the aneurysm.  He does note the patient can follow-up on an outpatient basis and does not warrant admission at this time.  Patient has no indication for pneumonia.  Troponins are at his baseline at this time.  Patient does have a chronically elevated BNP.  Viral swabs were negative as well.  Creatinine is at his baseline at this point.  Will plan for discharge home with follow-up with general surgery, PCP, vascular surgery.  Strict return precautions were provided for any new or worsening symptoms. Will cover patient for urinary tract infection at this time as well.  Social Determinants of Health:  None            Final Clinical Impression(s) / ED Diagnoses Final diagnoses:  None    Rx / DC Orders ED Discharge Orders     None         Lelon Perla, PA-C 04/26/23 1817    Derwood Kaplan, MD 04/27/23 1025

## 2023-04-27 ENCOUNTER — Encounter: Payer: Self-pay | Admitting: Student

## 2023-04-27 ENCOUNTER — Ambulatory Visit: Attending: Student | Admitting: Student

## 2023-04-27 VITALS — BP 150/98 | HR 81 | Ht 72.0 in | Wt 136.0 lb

## 2023-04-27 DIAGNOSIS — N1832 Chronic kidney disease, stage 3b: Secondary | ICD-10-CM | POA: Diagnosis not present

## 2023-04-27 DIAGNOSIS — I2510 Atherosclerotic heart disease of native coronary artery without angina pectoris: Secondary | ICD-10-CM

## 2023-04-27 DIAGNOSIS — I5020 Unspecified systolic (congestive) heart failure: Secondary | ICD-10-CM | POA: Diagnosis not present

## 2023-04-27 DIAGNOSIS — I6523 Occlusion and stenosis of bilateral carotid arteries: Secondary | ICD-10-CM | POA: Diagnosis not present

## 2023-04-27 DIAGNOSIS — I480 Paroxysmal atrial fibrillation: Secondary | ICD-10-CM

## 2023-04-27 MED ORDER — VERQUVO 2.5 MG PO TABS: 2.5000 mg | ORAL_TABLET | Freq: Every day | ORAL | 5 refills | Status: DC

## 2023-04-27 MED ORDER — CARVEDILOL 6.25 MG PO TABS: 6.2500 mg | ORAL_TABLET | Freq: Two times a day (BID) | ORAL | 3 refills | Status: DC

## 2023-04-27 NOTE — Progress Notes (Signed)
 Cardiology Office Note    Date:  04/27/2023  ID:  Michael [name], Boutte 12-19-1955, MRN [id number] Cardiologist: Nona Dell, MD    History of Present Illness:    Michael [name] is a 68 y.o. male with past medical history of chronic HFrEF (EF 30% in 10/2021, at 35-40% in 04/2022, 25-30% in 07/2022 and 25% in 11/2022), CAD (cath in 10/2021 showing mild to moderate obstructive CAD along mid-RCA and acute Mrg with medical management recommended), carotid artery stenosis (s/p right TCAR in 01/2021), iron deficiency anemia, HTN (complicated by orthostatic hypotension), HLD, COPD and Stage 3 CKD who presents to the office today for hospital follow-up.  He was hospitalized from 2/28 - 04/06/2023 for an acute CHF exacerbation and responded well to IV Lasix. Weight had declined to 132 lbs at discharge and he was transitioned to Lasix 40 mg twice daily while being continued on Eliquis, Atorvastatin 40 mg daily, Coreg 6.25 mg twice daily, Losartan 25 mg daily and Repatha.  Most recent hospitalization was from 3/17 - 04/21/2023 for recurrent CHF exacerbation and Cardiology did follow the patient that admission. GDMT was limited as he had orthostatic intolerances to [facility] and recurrent UTI's on SGLT2 inhibitors. He had been started on Imdur and Hydralazine earlier in the admission but it was felt he would likely be noncompliant with these an outpatient. Therefore, was switched to Verquvo 2.5 mg daily while being continued on Lasix 40 mg twice daily at discharge. Weight had declined to 127 lbs.   Presented back to [facility] ED on 04/26/2023 for shortness of breath. Was negative for COVID, influenza and RSV. Troponin values were flat at 55 and 50 which was similar to prior values. BNP greater than 4500 which was overall similar to prior values as well. CTA showed no evidence of a PE but was noted to have aneurysmal dilatation of the infrarenal aorta and right common iliac artery with Vascular Surgery  consult recommended as an outpatient. He did have a large gallstone with contracted gallbladder and mild to moderate diffuse circumferential wall thickening and ultrasound imaging showed cholelithiasis and common bile duct dilatation but no ultrasound evidence of choledocholithiasis. He was evaluated by the Hospitalist and was recommended to follow-up with General Surgery (Dr. Lovell Sheehan) as an outpatient. Was also encouraged to reestablish with Vascular Surgery in regards to his CT findings. He did receive IV Lasix while in the ED with improvement in symptoms and was discharged home.  In talking with the patient today, he is frustrated that he was not admitted to the hospital yesterday following ED evaluation. Says that his abdominal discomfort has resolved and he denies any pain at this current time. Still short of breath with minimal activity. He has not yet taken any medications today, including Lasix or antihypertensives. Says he typically only takes Lasix once daily and has been consuming 80 to 100 ounces of water daily. Denies any chest pain or palpitations. No specific orthopnea, PND or pitting edema.  Studies Reviewed:   EKG: EKG is not ordered today.  Echocardiogram: 04/2023 IMPRESSIONS     1. Left ventricular ejection fraction, by estimation, is 20 to 25%. The  left ventricle has severely decreased function. The left ventricle  demonstrates regional wall motion abnormalities (see scoring  diagram/findings for description). The left  ventricular internal cavity size was mildly to moderately dilated. There  is mild concentric left ventricular hypertrophy. Left ventricular  diastolic parameters are consistent with Grade II diastolic dysfunction  (pseudonormalization).   2.  Right ventricular systolic function is mildly reduced. The right  ventricular size is normal. Tricuspid regurgitation signal is inadequate  for assessing PA pressure.   3. Left atrial size was mildly dilated.   4.  Right atrial size was mildly dilated.   5. The mitral valve is degenerative. Mild mitral valve regurgitation.   6. The aortic valve is tricuspid. Aortic valve regurgitation is not  visualized. No aortic stenosis is present. Aortic valve mean gradient  measures 3.5 mmHg.   7. The inferior vena cava is normal in size with greater than 50%  respiratory variability, suggesting right atrial pressure of 3 mmHg.   Comparison(s): Prior images reviewed side by side. LVEF remains severely  reduced in 20-25% range.   FINDINGS   Left Ventricle: Left ventricular ejection fraction, by estimation, is 20  to 25%. The left ventricle has severely decreased function. The left  ventricle demonstrates regional wall motion abnormalities. The left  ventricular internal cavity size was mildly   to moderately dilated. There is mild concentric left ventricular  hypertrophy. Left ventricular diastolic parameters are consistent with  Grade II diastolic dysfunction (pseudonormalization).     LV Wall Scoring:  The antero-lateral wall, posterior wall, and basal inferior segment are  akinetic. The entire anterior wall, entire septum, entire apex, and mid  and  distal inferior wall are hypokinetic.   Risk Assessment/Calculations:    CHA2DS2-VASc Score = 4   This indicates a 4.8% annual risk of stroke. The patient's score is based upon: CHF History: 1 HTN History: 1 Diabetes History: 0 Stroke History: 0 Vascular Disease History: 1 Age Score: 1 Gender Score: 0   Physical Exam:   VS:  BP (!) 150/98   Pulse 81   Ht 6' (1.829 m)   Wt 136 lb (61.7 kg)   SpO2 95%   BMI 18.44 kg/m    Wt Readings from Last 3 Encounters:  04/27/23 136 lb (61.7 kg)  04/26/23 127 lb 3.3 oz (57.7 kg)  04/21/23 127 lb 6.4 oz (57.8 kg)     GEN: Thin male appearing in no acute distress NECK: No JVD; No carotid bruits CARDIAC: RRR, no murmurs, rubs, gallops RESPIRATORY: Rales along left base. No rhonchi or wheezing.   ABDOMEN: Appears non-distended. No obvious abdominal masses. EXTREMITIES: No clubbing or cyanosis. No pitting edema.  Distal pedal pulses are 2+ bilaterally.   Assessment and Plan:   1. Chronic HFrEF - He has a longstanding cardiomyopathy with EF at 20 to 25% by most recent imaging earlier this month. Previously not felt to be an ICD candidate given noncompliance. - Compliance with medications continues to be an issue as he has not yet taken his medications today and says he typically takes Lasix once daily. Compliance with Lasix 40 mg twice daily was encouraged along with taking Coreg 6.25 mg twice daily and Losartan 25 mg daily. We did verify with his pharmacy that [facility] 2.5mg  daily was sent at the time of his hospital discharge and they had to order the medication and it should arrive tomorrow. He has not been on Entresto due to orthostatic hypotension with this and has frequent UTI's which limits the use of SGLT2 inhibitors. - Recommended he reduce his fluid intake to 2 L per day. Unfortunately, he remains high-risk for readmission given his cardiomyopathy, increased fluid intake and medication noncompliance.   2. CAD - He had mild to moderate nonobstructive CAD by prior cardiac catheterization in 10/2021 with medical management recommended. Denies any recent chest  pain. - Continue Coreg 6.25 mg twice daily and Atorvastatin 40 mg daily. He is also on Repatha and LDL was 18 when checked in 2024. No longer on ASA given the need for anticoagulation.  3. Paroxysmal Atrial Fibrillation - Denies any recent palpitations and is maintaining normal sinus rhythm by examination today. Continue Coreg 6.25 mg twice daily. - No reports of active bleeding. Remains on Eliquis 5 mg twice daily for anticoagulation. We reviewed the importance of compliance with twice daily dosing as he typically only takes his medications once daily. If this continues to be an issue, would switch to Xarelto.  4. Carotid  Artery Stenosis - He did have right TCAR in 01/2021 and dopplers in 11/2022 showed no hemodynamically significant stenosis along the RICA but was noted to have moderate to large plaque along the left carotid with repeat imaging recommended in 1 year.  CT yesterday did show aneurysmal dilatation of the infrarenal aorta and right common iliac artery and he is scheduled to reestablish care with Vascular in 06/2023. - Continue Atorvastatin 40 mg daily and Repatha.  5. Stage 3 CKD - Creatinine was stable at 1.55 by recent labs on 04/26/2023.   Signed, Ellsworth Lennox, PA-C

## 2023-04-27 NOTE — Patient Instructions (Signed)
 Medication Instructions:  Your physician recommends that you continue on your current medications as directed. Please refer to the Current Medication list given to you today.  Start Verquvo 2.5 mg tablets once daily  *If you need a refill on your cardiac medications before your next appointment, please call your pharmacy*  Lab Work: None  If you have labs (blood work) drawn today and your tests are completely normal, you will receive your results only by: MyChart Message (if you have MyChart) OR A paper copy in the mail If you have any lab test that is abnormal or we need to change your treatment, we will call you to review the results.  Testing/Procedures: None  Follow-Up: At [facility], you and your health needs are our priority.  As part of our continuing mission to provide you with exceptional heart care, our providers are all part of one team.  This team includes your primary Cardiologist (physician) and Advanced Practice Providers or APPs (Physician Assistants and Nurse Practitioners) who all work together to provide you with the care you need, when you need it.  Your next appointment:    Keep follow up with Dr.McDowell  Provider:   You may see Nona Dell, MD or one of the following Advanced Practice Providers on your designated Care Team:   Randall An, PA-C  [place], [place] Jacolyn Reedy, [place]     We recommend signing up for the patient portal called "MyChart".  Sign up information is provided on this After Visit Summary.  MyChart is used to connect with patients for Virtual Visits (Telemedicine).  Patients are able to view lab/test results, encounter notes, upcoming appointments, etc.  Non-urgent messages can be sent to your provider as well.   To learn more about what you can do with MyChart, go to [email or web address].   Other Instructions

## 2023-04-28 ENCOUNTER — Telehealth: Payer: Self-pay | Admitting: Family

## 2023-04-28 NOTE — Telephone Encounter (Incomplete)
 Called to confirm/remind patient of their appointment at the [facility] on 05/01/23***.   Appointment:   [] Confirmed  [] Left mess   [x] No answer/No voice mail  [] Phone not in service  Patient reminded to bring all medications and/or complete list.  Confirmed patient has transportation. Gave directions, instructed to utilize valet parking.

## 2023-04-30 NOTE — Progress Notes (Deleted)
 Advanced [facility] Failure Clinic Note   Referring Physician: PCP: Benita Stabile, MD Cardiologist: Nona Dell, MD / Randall An, [place] (last seen 03/25)  Chief Complaint:   HPI:  Michael [name] is a 68 y/o male with a history of chronic HFrEF (EF 30% in 10/2021, at 35-40% in 04/2022, 25-30% in 07/2022 and 25% in 11/2022), CAD (cath in 10/2021 showing mild to moderate obstructive CAD along mid-RCA and acute Mrg with medical management recommended), carotid artery stenosis (s/p right TCAR in 01/2021), iron deficiency anemia, HTN (complicated by orthostatic hypotension), HLD, COPD and Stage 3 CKD.  Admitted 2/28 - 04/06/2023 for an acute CHF exacerbation and responded well to IV Lasix. Weight had declined to 132 lbs at discharge and he was transitioned to Lasix 40 mg twice daily while being continued on Eliquis, Atorvastatin 40 mg daily, Coreg 6.25 mg twice daily, Losartan 25 mg daily and Repatha.  Admitted 3/17 - 04/21/2023 for recurrent CHF exacerbation. GDMT was limited as he had orthostatic intolerances to [facility] and recurrent UTI's on SGLT2 inhibitors. He had been started on Imdur and Hydralazine earlier in the admission but it was felt he would likely be noncompliant with these an outpatient. Therefore, was switched to Verquvo 2.5 mg daily while being continued on Lasix 40 mg twice daily at discharge. Echo 04/20/23: EF 20-25% with mild LVH, G2DD, mildly reduced RV, mild Michael. Weight had declined to 127 lbs.   Was at [facility] ED on 04/26/2023 for shortness of breath. Was negative for COVID, influenza and RSV. Troponin values were flat at 55 and 50 which was similar to prior values. BNP greater than 4500 which was overall similar to prior values as well. 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 He did have a large gallstone with contracted gallbladder  and mild to moderate diffuse circumferential wall thickening and ultrasound imaging showed cholelithiasis and common bile duct dilatation but no ultrasound evidence of choledocholithiasis. He was evaluated by the Hospitalist and was recommended to follow-up with General Surgery (Dr. Lovell Sheehan) as an outpatient. Was also encouraged to reestablish with Vascular Surgery in regards to his CT findings. He did receive IV Lasix while in the ED with improvement in symptoms and was discharged home.  He presents today for his initial HF visit with a chief complaint of    Review of Systems: [y] = yes, [ ]  = no   General: Weight gain [ ] ; Weight loss [ ] ; Anorexia [ ] ; Fatigue [ ] ; Fever [ ] ; Chills [ ] ; Weakness [ ]   Cardiac: Chest pain/pressure [ ] ; Resting SOB [ ] ; Exertional SOB [ ] ; Orthopnea [ ] ; Pedal Edema [ ] ; Palpitations [ ] ; Syncope [ ] ; Presyncope [ ] ; Paroxysmal nocturnal dyspnea[ ]   Pulmonary: Cough [ ] ; Wheezing[ ] ; Hemoptysis[ ] ; Sputum [ ] ; Snoring [ ]   GI: Vomiting[ ] ; Dysphagia[ ] ; Melena[ ] ; Hematochezia [ ] ; Heartburn[ ] ; Abdominal pain [ ] ; Constipation [ ] ; Diarrhea [ ] ; BRBPR [ ]   GU: Hematuria[ ] ; Dysuria [ ] ; Nocturia[ ]   Vascular: Pain in legs with walking [ ] ; Pain in feet with lying flat [ ] ; Non-healing sores [ ] ; Stroke [ ] ; TIA [ ] ; Slurred speech [ ] ;  Neuro: Headaches[ ] ; Vertigo[ ] ; Seizures[ ] ; Paresthesias[ ] ;Blurred vision [ ] ; Diplopia [ ] ; Vision changes [ ]   Ortho/Skin: Arthritis [ ] ; Joint pain [ ] ;  Muscle pain [ ] ; Joint swelling [ ] ; Back Pain [ ] ; Rash [ ]   Psych: Depression[ ] ; Anxiety[ ]   Heme: Bleeding problems [ ] ; Clotting disorders [ ] ; Anemia [ ]   Endocrine: Diabetes [ ] ; Thyroid dysfunction[ ]    Past Medical History:  Diagnosis Date   Aortic atherosclerosis (HCC) 08/03/2021   Atrial fibrillation (HCC)    CAD (coronary artery disease) 08/03/2021   Cardiac catheterization September 2023 with RCA/RV marginal stenosis managed medically   Cardiomyopathy  (HCC)    Carotid artery disease (HCC)    CKD (chronic kidney disease) stage 3, GFR 30-59 ml/min (HCC)    COPD (chronic obstructive pulmonary disease) (HCC)    Essential hypertension    Head and neck cancer (HCC) 2019   Right facial basal cell carcinoma s/p resection with right partial mastectomy and partal rhinectomy with skin graft (06/13/17)   Iron deficiency anemia    Mass of colon 08/06/2021   Formatting of this note might be different from the original. Last Assessment & Plan:  Formatting of this note might be different from the original.  Status post right hemicolectomy by Dr. Henreitta Leber today 7/12.    -Will check postop CBC and BMP     Urinary retention     Current Outpatient Medications  Medication Sig Dispense Refill   apixaban (ELIQUIS) 2.5 MG TABS tablet Take 1 tablet (2.5 mg total) by mouth 2 (two) times daily. 60 tablet 1   atorvastatin (LIPITOR) 40 MG tablet Take 1 tablet (40 mg total) by mouth every evening. (Patient taking differently: Take 40 mg by mouth daily.) 30 tablet 1   Budeson-Glycopyrrol-Formoterol (BREZTRI AEROSPHERE) 160-9-4.8 MCG/ACT AERO Inhale 2 puffs into the lungs 2 (two) times daily.     carvedilol (COREG) 6.25 MG tablet Take 1 tablet (6.25 mg total) by mouth 2 (two) times daily with a meal. 180 tablet 3   cephALEXin (KEFLEX) 500 MG capsule Take 1 capsule (500 mg total) by mouth 2 (two) times daily for 7 days. 14 capsule 0   furosemide (LASIX) 40 MG tablet Take 1 tablet (40 mg total) by mouth 2 (two) times daily. 60 tablet 1   losartan (COZAAR) 25 MG tablet Take 25 mg by mouth daily.     nitroGLYCERIN (NITROSTAT) 0.4 MG SL tablet Place 1 tablet (0.4 mg total) under the tongue every 5 (five) minutes as needed for chest pain. 30 tablet 0   potassium chloride SA (KLOR-CON M) 20 MEQ tablet Take 1 tablet (20 mEq total) by mouth daily. 30 tablet 2   Vericiguat (VERQUVO) 2.5 MG TABS Take 1 tablet (2.5 mg total) by mouth daily. 30 tablet 5   No current  facility-administered medications for this visit.    Allergies  Allergen Reactions   Entresto [Sacubitril-Valsartan]     Orthostatic Hypotension   Sglt2 Inhibitors     UTI's      Social History   Socioeconomic History   Marital status: Single    Spouse name: Not on file   Number of children: Not on file   Years of education: 12   Highest education level: 12th grade  Occupational History   Not on file  Tobacco Use   Smoking status: Former    Current packs/day: 0.00    Average packs/day: 0.3 packs/day for 40.0 years (10.0 ttl pk-yrs)    Types: Cigarettes    Start date: 07/02/1981    Quit date: 07/02/2021    Years since quitting: 1.8    Passive exposure: Past  Smokeless tobacco: Never   Tobacco comments:    Quit on 06/09/21  Vaping Use   Vaping status: Never Used  Substance and Sexual Activity   Alcohol use: Not Currently   Drug use: Never   Sexual activity: Not Currently  Other Topics Concern   Not on file  Social History Narrative   Not on file   Social Drivers of Health   Financial Resource Strain: Low Risk  (12/26/2022)   Overall Financial Resource Strain (CARDIA)    Difficulty of Paying Living Expenses: Not hard at all  Food Insecurity: No Food Insecurity (04/24/2023)   Hunger Vital Sign    Worried About Running Out of Food in the Last Year: Never true    Ran Out of Food in the Last Year: Never true  Transportation Needs: No Transportation Needs (04/24/2023)   PRAPARE - Administrator, Civil Service (Medical): No    Lack of Transportation (Non-Medical): No  Physical Activity: Sufficiently Active (12/26/2022)   Exercise Vital Sign    Days of Exercise per Week: 5 days    Minutes of Exercise per Session: 50 min  Stress: No Stress Concern Present (12/26/2022)   [employer] of Occupational Health - Occupational Stress Questionnaire    Feeling of Stress : Only a little  Social Connections: Socially Isolated (04/17/2023)   Social Connection and  Isolation Panel [NHANES]    Frequency of Communication with Friends and Family: More than three times a week    Frequency of Social Gatherings with Friends and Family: More than three times a week    Attends Religious Services: Never    Database administrator or Organizations: No    Attends Banker Meetings: Never    Marital Status: Widowed  Intimate Partner Violence: Not At Risk (04/24/2023)   Humiliation, Afraid, Rape, and Kick questionnaire    Fear of Current or Ex-Partner: No    Emotionally Abused: No    Physically Abused: No    Sexually Abused: No      Family History  Problem Relation Age of Onset   Stroke Father    Cirrhosis Mother    Colon cancer Neg Hx     There were no vitals filed for this visit.   PHYSICAL EXAM: General:  Well appearing. No respiratory difficulty HEENT: normal Neck: supple. no JVD. Carotids 2+ bilat; no bruits. No lymphadenopathy or thyromegaly appreciated. Cor: PMI nondisplaced. Regular rate & rhythm. No rubs, gallops or murmurs. Lungs: clear Abdomen: soft, nontender, nondistended. No hepatosplenomegaly. No bruits or masses. Good bowel sounds. Extremities: no cyanosis, clubbing, rash, edema Neuro: alert & oriented x 3, cranial nerves grossly intact. moves all 4 extremities w/o difficulty. Affect pleasant.  ECG:   ASSESSMENT & PLAN:  1: Chronic heart failure with reduced ejection fraction- - suspect due to - NYHA class - euvolemic - weighing daily - EF 30% in 10/2021, 35-40% in 04/2022, 25-30% in 07/2022 and 25% in 11/2022 - Echo 04/20/23: EF 20-25% with mild LVH, G2DD, mildly reduced RV, mild Michael.  - continue  - BNP 04/26/23 was >4500.00  2: HTN- - BP - saw PCP - BMET 04/26/23 reviewed and showed sodium 140, potassium 3.4, creatinine 1.55 and GFR 48  3: CAD- - saw cardiology [place] [facility]) 03/25 - cath in 10/2021 showing mild to moderate obstructive CAD along mid-RCA and acute Mrg  4: COPD- -   5: Anemia- - saw  hematology [facility]) 02/25   Delma Freeze, FNP 04/30/23

## 2023-05-01 ENCOUNTER — Encounter: Admitting: Family

## 2023-05-02 ENCOUNTER — Telehealth: Payer: Self-pay | Admitting: Family

## 2023-05-02 ENCOUNTER — Encounter: Admitting: Family

## 2023-05-02 ENCOUNTER — Encounter: Payer: Self-pay | Admitting: General Surgery

## 2023-05-02 ENCOUNTER — Ambulatory Visit (INDEPENDENT_AMBULATORY_CARE_PROVIDER_SITE_OTHER): Admitting: General Surgery

## 2023-05-02 VITALS — BP 161/88 | HR 62 | Temp 97.8°F | Resp 18 | Ht 72.0 in | Wt 137.0 lb

## 2023-05-02 DIAGNOSIS — K8020 Calculus of gallbladder without cholecystitis without obstruction: Secondary | ICD-10-CM | POA: Diagnosis not present

## 2023-05-02 NOTE — Patient Instructions (Signed)
 You have a large gallstone in your gallbladder. This has been there for years. Unless you are having symptoms, I would not recommend surgery as your heart is very weak and you could have issues with your heart after a surgery.  If you did need surgery, you may need to go to [place] or even somewhere like [place], [place], or [facility] pending your heart issues.   I will talk to Cardiology to get their final opinion, but my suspicion is that you are not a candidate for surgery at [facility] if you needed it.    Cholelithiasis  Cholelithiasis is a disease in which gallstones form in the gallbladder. The gallbladder is an organ that stores bile. Bile is a fluid that helps to digest fats. Gallstones begin as small crystals and can slowly grow into stones. They may cause no symptoms until they block the gallbladder duct, or cystic duct, when the gallbladder tightens, or contracts, after food is eaten. This can cause pain and is known as a gallbladder attack, or biliary colic. There are two main types of gallstones: Cholesterol stones. These are the most common type of gallstone. These stones are made of hardened cholesterol and are usually yellow-green in color. Pigment stones. These are dark in color and are made of a red-yellow substance, called bilirubin,that forms when hemoglobin from red blood cells breaks down. What are the causes? This condition may be caused by too little or too much of the substances that are in bile. This can happen if the bile: Has too much bilirubin. This can happen in certain blood diseases, such as sickle cell anemia. Has too much cholesterol. Does not have enough bile salts. These salts help the body absorb and digest fats. It can also happen if the gallbladder is not emptying completely. This is common during pregnancy. What increases the risk? The following factors may make you more likely to develop this condition: Being older than 69 years of age. Eating a  diet that is heavy in fried foods, fat, and refined carbohydrates, such as white bread and white rice. Being male. Having multiple pregnancies. Using medicines that contain male hormones (estrogen) for a long time. Having certain medical problems, such as: Diabetes mellitus. Obesity. Cystic fibrosis. Crohn's disease. Cirrhosis or other long-term (chronic) liver disease. Certain blood diseases, such as sickle cell anemia or leukemia. Having a family history of gallstones. Losing weight quickly. What are the signs or symptoms? In many cases, having gallstones causes no symptoms. These are called silent gallstones. If a gallstone blocks your bile duct, it can cause a gallbladder attack. The main symptom of a gallbladder attack is sudden pain in the upper right part of the abdomen. The pain: Usually comes at night or after eating. Can last for one hour or more. Can spread to your right shoulder, back, or chest. Can feel like indigestion. This is discomfort, burning, or fullness in your upper abdomen. If the bile duct is blocked for more than a few hours, it can cause an infection or inflammation of your gallbladder (cholecystitis), liver, or pancreas. This can cause: Nausea or vomiting. Bloating. Pain in your abdomen that lasts for 5 hours or longer. Tenderness in your upper abdomen, often in the upper right section and under your rib cage. Fever or chills. Skin or the white parts of your eyes turning yellow (jaundice). This usually happens when a stone has blocked bile from passing through the bile duct. Dark pee (urine) or light-colored poop (stools). How  is this diagnosed? This condition may be diagnosed based on: A physical exam. Your medical history. Ultrasound. CT scan. MRI. You may also have other tests, including: Blood tests to check for infection or inflammation. The HIDA scan to see the gallbladder and the bile ducts. An endoscope to check for blockage in the bile  ducts. How is this treated? Treatment depends on the severity of your symptoms. Silent gallstones do not need treatment. You may need treatment if a blockage causes a gallbladder attack or other symptoms. Treatment may include: If symptoms are mild, you may care for yourself at home. For mild symptoms: Stop eating and drinking for 12-24 hours. You may drink water and clear liquids. This helps to "cool down" your gallbladder. After 1 or 2 days, eat a diet of simple or clear foods, such as broths and crackers. Take medicines for pain or nausea. Take antibiotics if you have an infection. If symptoms are severe, you may: Stay in the hospital for pain control or to treat severe infection. Have surgery to remove the gallbladder (cholecystectomy). This is the most common treatment if all other treatments have not worked. Take medicines to break up gallstones. Medicines may be used for up to 6-12 months. Have an procedure to capture and remove gallstones. Follow these instructions at home: Medicines Take over-the-counter and prescription medicines only as told by your health care provider. If you were prescribed antibiotics, take them as told by your provider. Do not stop using the antibiotic even if you start to feel better. Ask your provider if the medicine prescribed to you requires you to avoid driving or using machinery. Eating and drinking Drink enough fluid to keep your pee pale yellow. This is important during a gallbladder attack. Water and clear liquids are preferred. Follow a healthy diet. This includes: Reducing fatty foods, such as fried food and foods high in cholesterol. Reducing refined carbohydrates, such as white bread and white rice. Eating more fiber. Aim for foods such as almonds, fruit, and beans. General instructions Do not use any products that contain nicotine or tobacco. These products include cigarettes, chewing tobacco, and vaping devices, such as e-cigarettes. If you need  help quitting, ask your provider. Maintain a healthy weight. Keep all follow-up visits. These may include seeing a specialist or a Careers adviser. Where to find more information [employer] of Diabetes and Digestive and Kidney Diseases: [email or web address] Contact a health care provider if: You think you have had a gallbladder attack. You have been diagnosed with silent gallstones and you develop indigestion or pain in your abdomen. You have pain from a gallbladder attack that lasts for more than 2 hours. You begin to have attacks more often. You have nausea. You have dark pee or light-colored poop. Get help right away if: You have pain in your abdomen that lasts for more than 5 hours or is getting worse. You have a fever or chills. You have vomiting that does not go away. You develop jaundice. This information is not intended to replace advice given to you by your health care provider. Make sure you discuss any questions you have with your health care provider. Document Revised: 11/01/2021 Document Reviewed: 11/01/2021 Elsevier Patient Education  2024 [employer].

## 2023-05-02 NOTE — Telephone Encounter (Signed)
 Patient did not show for his initial Heart Failure Clinic appointment on 05/02/23.

## 2023-05-02 NOTE — Progress Notes (Signed)
 [facility]   HPI:  68 y.o. Male presents to clinic for ***post-op follow-up evaluation of ***. Patient reports ***, denies ***.  Review of Systems:  *** All other review of systems: otherwise negative   Vital Signs:  BP (!) 161/88   Pulse 62   Temp 97.8 F (36.6 C) (Other (Comment))   Resp 18   Ht 6' (1.829 m)   Wt 137 lb (62.1 kg)   SpO2 97%   BMI 18.58 kg/m    Physical Exam:  Physical Exam  Laboratory studies: {Labs :18171}   Imaging:  ***   Assessment:  68 y.o. yo Male with ***.  Plan:  - ***  - *** - *** - Follow up  All of the above recommendations were discussed with the patient ***and patient's family, and all of patient's ***and family's questions were answered to ***his/her/their expressed satisfaction.  Algis Greenhouse, MD [facility] [address] [place], [place] [address] [phone] (office)  ES3 -HPI 1 ROS 1, EXAM 2-7*** ES4 HPI 4, ROS 2, PSFH 1, EXAM 2-7***

## 2023-05-04 ENCOUNTER — Telehealth (INDEPENDENT_AMBULATORY_CARE_PROVIDER_SITE_OTHER): Payer: Self-pay | Admitting: General Surgery

## 2023-05-04 DIAGNOSIS — C44311 Basal cell carcinoma of skin of nose: Secondary | ICD-10-CM

## 2023-05-04 DIAGNOSIS — K8020 Calculus of gallbladder without cholecystitis without obstruction: Secondary | ICD-10-CM

## 2023-05-04 NOTE — Telephone Encounter (Signed)
 [facility]  Discussed patient with Cardiology, Randall An PA saw him last. She agrees given his noncompliance and CHF he is not a candidate for surgery at [facility] and if he does come to needing a cholecystectomy he will need to be referred to [facility] or potentially tertiary care center.  I have discussed my concern for recurrent basal cell of the bridge of his nose with Dr. Jill Side and Dr. Basilio Cairo. They both agree that referral to Dermatology for them to evaluate and determine if recurrence is appropriate.  Will have the office get in the referral and update the patient with the above information.   Given that patient will not be able to get surgery at [facility], if he continues to have concerns in the future with regards to his gallbladder, he will need to be referred to [facility] or tertiary care. Currently he is asymptomatic.    Algis Greenhouse, MD [facility] [address] [place], [place] [address] [phone] (office)

## 2023-05-05 NOTE — Telephone Encounter (Signed)
 Call placed to patient and patient made aware. Verbalized understanding. States that if he has issues with gallbladder, he will call for referral to tertiary care.   Agreeable to referral to Dermatology. Requested to be seen in [place]. Referral orders placed to Dr. Nita Sells.

## 2023-05-10 ENCOUNTER — Emergency Department (HOSPITAL_COMMUNITY)

## 2023-05-10 ENCOUNTER — Inpatient Hospital Stay (HOSPITAL_COMMUNITY)
Admission: EM | Admit: 2023-05-10 | Discharge: 2023-05-17 | DRG: 280 | Disposition: A | Discharge status: Home / Self Care | Attending: Internal Medicine | Admitting: Internal Medicine

## 2023-05-10 ENCOUNTER — Other Ambulatory Visit: Payer: Self-pay

## 2023-05-10 DIAGNOSIS — Z7189 Other specified counseling: Secondary | ICD-10-CM | POA: Diagnosis not present

## 2023-05-10 DIAGNOSIS — I480 Paroxysmal atrial fibrillation: Secondary | ICD-10-CM | POA: Diagnosis present

## 2023-05-10 DIAGNOSIS — Z85828 Personal history of other malignant neoplasm of skin: Secondary | ICD-10-CM

## 2023-05-10 DIAGNOSIS — D509 Iron deficiency anemia, unspecified: Secondary | ICD-10-CM | POA: Diagnosis present

## 2023-05-10 DIAGNOSIS — Z8719 Personal history of other diseases of the digestive system: Secondary | ICD-10-CM

## 2023-05-10 DIAGNOSIS — I517 Cardiomegaly: Secondary | ICD-10-CM | POA: Diagnosis not present

## 2023-05-10 DIAGNOSIS — I4891 Unspecified atrial fibrillation: Secondary | ICD-10-CM | POA: Diagnosis present

## 2023-05-10 DIAGNOSIS — I21A1 Myocardial infarction type 2: Secondary | ICD-10-CM | POA: Diagnosis present

## 2023-05-10 DIAGNOSIS — R7989 Other specified abnormal findings of blood chemistry: Secondary | ICD-10-CM

## 2023-05-10 DIAGNOSIS — R54 Age-related physical debility: Secondary | ICD-10-CM | POA: Diagnosis present

## 2023-05-10 DIAGNOSIS — I700 Atherosclerosis of aorta: Secondary | ICD-10-CM | POA: Diagnosis present

## 2023-05-10 DIAGNOSIS — E876 Hypokalemia: Secondary | ICD-10-CM | POA: Diagnosis present

## 2023-05-10 DIAGNOSIS — Z7901 Long term (current) use of anticoagulants: Secondary | ICD-10-CM

## 2023-05-10 DIAGNOSIS — I160 Hypertensive urgency: Secondary | ICD-10-CM | POA: Diagnosis not present

## 2023-05-10 DIAGNOSIS — Z720 Tobacco use: Secondary | ICD-10-CM | POA: Diagnosis present

## 2023-05-10 DIAGNOSIS — Z515 Encounter for palliative care: Secondary | ICD-10-CM | POA: Diagnosis not present

## 2023-05-10 DIAGNOSIS — Z888 Allergy status to other drugs, medicaments and biological substances status: Secondary | ICD-10-CM

## 2023-05-10 DIAGNOSIS — Z961 Presence of intraocular lens: Secondary | ICD-10-CM | POA: Diagnosis present

## 2023-05-10 DIAGNOSIS — D638 Anemia in other chronic diseases classified elsewhere: Secondary | ICD-10-CM | POA: Diagnosis present

## 2023-05-10 DIAGNOSIS — I5043 Acute on chronic combined systolic (congestive) and diastolic (congestive) heart failure: Secondary | ICD-10-CM | POA: Diagnosis present

## 2023-05-10 DIAGNOSIS — N179 Acute kidney failure, unspecified: Secondary | ICD-10-CM | POA: Diagnosis not present

## 2023-05-10 DIAGNOSIS — I2510 Atherosclerotic heart disease of native coronary artery without angina pectoris: Secondary | ICD-10-CM | POA: Diagnosis present

## 2023-05-10 DIAGNOSIS — Z604 Social exclusion and rejection: Secondary | ICD-10-CM | POA: Diagnosis present

## 2023-05-10 DIAGNOSIS — Z1152 Encounter for screening for COVID-19: Secondary | ICD-10-CM | POA: Diagnosis not present

## 2023-05-10 DIAGNOSIS — I509 Heart failure, unspecified: Principal | ICD-10-CM

## 2023-05-10 DIAGNOSIS — E782 Mixed hyperlipidemia: Secondary | ICD-10-CM | POA: Diagnosis present

## 2023-05-10 DIAGNOSIS — I429 Cardiomyopathy, unspecified: Secondary | ICD-10-CM | POA: Diagnosis present

## 2023-05-10 DIAGNOSIS — I130 Hypertensive heart and chronic kidney disease with heart failure and stage 1 through stage 4 chronic kidney disease, or unspecified chronic kidney disease: Principal | ICD-10-CM | POA: Diagnosis present

## 2023-05-10 DIAGNOSIS — R778 Other specified abnormalities of plasma proteins: Secondary | ICD-10-CM | POA: Diagnosis not present

## 2023-05-10 DIAGNOSIS — Z7951 Long term (current) use of inhaled steroids: Secondary | ICD-10-CM

## 2023-05-10 DIAGNOSIS — R0989 Other specified symptoms and signs involving the circulatory and respiratory systems: Secondary | ICD-10-CM | POA: Diagnosis not present

## 2023-05-10 DIAGNOSIS — I110 Hypertensive heart disease with heart failure: Secondary | ICD-10-CM | POA: Diagnosis not present

## 2023-05-10 DIAGNOSIS — I161 Hypertensive emergency: Secondary | ICD-10-CM | POA: Diagnosis present

## 2023-05-10 DIAGNOSIS — N400 Enlarged prostate without lower urinary tract symptoms: Secondary | ICD-10-CM | POA: Diagnosis present

## 2023-05-10 DIAGNOSIS — F17200 Nicotine dependence, unspecified, uncomplicated: Secondary | ICD-10-CM | POA: Diagnosis present

## 2023-05-10 DIAGNOSIS — R9431 Abnormal electrocardiogram [ECG] [EKG]: Secondary | ICD-10-CM | POA: Diagnosis present

## 2023-05-10 DIAGNOSIS — Z8589 Personal history of malignant neoplasm of other organs and systems: Secondary | ICD-10-CM

## 2023-05-10 DIAGNOSIS — N1832 Chronic kidney disease, stage 3b: Secondary | ICD-10-CM | POA: Diagnosis present

## 2023-05-10 DIAGNOSIS — R0602 Shortness of breath: Secondary | ICD-10-CM | POA: Diagnosis not present

## 2023-05-10 DIAGNOSIS — K8020 Calculus of gallbladder without cholecystitis without obstruction: Secondary | ICD-10-CM | POA: Diagnosis present

## 2023-05-10 DIAGNOSIS — Z9842 Cataract extraction status, left eye: Secondary | ICD-10-CM

## 2023-05-10 DIAGNOSIS — I6521 Occlusion and stenosis of right carotid artery: Secondary | ICD-10-CM | POA: Diagnosis present

## 2023-05-10 DIAGNOSIS — J449 Chronic obstructive pulmonary disease, unspecified: Secondary | ICD-10-CM | POA: Diagnosis present

## 2023-05-10 DIAGNOSIS — Z79899 Other long term (current) drug therapy: Secondary | ICD-10-CM

## 2023-05-10 DIAGNOSIS — Z87440 Personal history of urinary (tract) infections: Secondary | ICD-10-CM

## 2023-05-10 DIAGNOSIS — Z91199 Patient's noncompliance with other medical treatment and regimen due to unspecified reason: Secondary | ICD-10-CM

## 2023-05-10 DIAGNOSIS — Z66 Do not resuscitate: Secondary | ICD-10-CM | POA: Diagnosis present

## 2023-05-10 DIAGNOSIS — Z9049 Acquired absence of other specified parts of digestive tract: Secondary | ICD-10-CM

## 2023-05-10 LAB — HEPATIC FUNCTION PANEL
ALT: 63 U/L — ABNORMAL HIGH (ref 0–44)
AST: 63 U/L — ABNORMAL HIGH (ref 15–41)
Albumin: 3.6 g/dL (ref 3.5–5.0)
Alkaline Phosphatase: 252 U/L — ABNORMAL HIGH (ref 38–126)
Bilirubin, Direct: 0.4 mg/dL — ABNORMAL HIGH (ref 0.0–0.2)
Indirect Bilirubin: 1.1 mg/dL — ABNORMAL HIGH (ref 0.3–0.9)
Total Bilirubin: 1.5 mg/dL — ABNORMAL HIGH (ref 0.0–1.2)
Total Protein: 7.4 g/dL (ref 6.5–8.1)

## 2023-05-10 LAB — CBC WITH DIFFERENTIAL/PLATELET
Abs Immature Granulocytes: 0.02 10*3/uL (ref 0.00–0.07)
Basophils Absolute: 0.1 10*3/uL (ref 0.0–0.1)
Basophils Relative: 1 %
Eosinophils Absolute: 0.1 10*3/uL (ref 0.0–0.5)
Eosinophils Relative: 2 %
HCT: 40.4 % (ref 39.0–52.0)
Hemoglobin: 12.6 g/dL — ABNORMAL LOW (ref 13.0–17.0)
Immature Granulocytes: 0 %
Lymphocytes Relative: 23 %
Lymphs Abs: 1.8 10*3/uL (ref 0.7–4.0)
MCH: 27.6 pg (ref 26.0–34.0)
MCHC: 31.2 g/dL (ref 30.0–36.0)
MCV: 88.4 fL (ref 80.0–100.0)
Monocytes Absolute: 0.6 10*3/uL (ref 0.1–1.0)
Monocytes Relative: 7 %
Neutro Abs: 5.2 10*3/uL (ref 1.7–7.7)
Neutrophils Relative %: 67 %
Platelets: 223 10*3/uL (ref 150–400)
RBC: 4.57 MIL/uL (ref 4.22–5.81)
RDW: 16.3 % — ABNORMAL HIGH (ref 11.5–15.5)
WBC: 7.8 10*3/uL (ref 4.0–10.5)
nRBC: 0 % (ref 0.0–0.2)

## 2023-05-10 LAB — BASIC METABOLIC PANEL WITH GFR
Anion gap: 11 (ref 5–15)
BUN: 30 mg/dL — ABNORMAL HIGH (ref 8–23)
CO2: 23 mmol/L (ref 22–32)
Calcium: 9.3 mg/dL (ref 8.9–10.3)
Chloride: 104 mmol/L (ref 98–111)
Creatinine, Ser: 1.74 mg/dL — ABNORMAL HIGH (ref 0.61–1.24)
GFR, Estimated: 42 mL/min — ABNORMAL LOW (ref >60)
Glucose, Bld: 133 mg/dL — ABNORMAL HIGH (ref 70–99)
Potassium: 3.8 mmol/L (ref 3.5–5.1)
Sodium: 138 mmol/L (ref 135–145)

## 2023-05-10 LAB — TROPONIN I (HIGH SENSITIVITY)
Troponin I (High Sensitivity): 86 ng/L — ABNORMAL HIGH (ref <18)
Troponin I (High Sensitivity): 88 ng/L — ABNORMAL HIGH (ref <18)

## 2023-05-10 LAB — MAGNESIUM: Magnesium: 2.2 mg/dL (ref 1.7–2.4)

## 2023-05-10 LAB — BRAIN NATRIURETIC PEPTIDE: B Natriuretic Peptide: >4500 pg/mL — ABNORMAL HIGH (ref 0.0–100.0)

## 2023-05-10 MED ORDER — NICOTINE 14 MG/24HR TD PT24
14.0000 mg | MEDICATED_PATCH | Freq: Every day | TRANSDERMAL | Status: DC
  Administered 2023-05-10 – 2023-05-17 (×8): 14 mg via TRANSDERMAL
  Filled 2023-05-10 (×8): qty 1

## 2023-05-10 MED ORDER — IPRATROPIUM-ALBUTEROL 0.5-2.5 (3) MG/3ML IN SOLN
3.0000 mL | Freq: Once | RESPIRATORY_TRACT | Status: AC
  Administered 2023-05-10: 3 mL via RESPIRATORY_TRACT
  Filled 2023-05-10: qty 3

## 2023-05-10 MED ORDER — ATORVASTATIN CALCIUM 40 MG PO TABS
40.0000 mg | ORAL_TABLET | Freq: Every day | ORAL | Status: DC
  Administered 2023-05-10 – 2023-05-16 (×7): 40 mg via ORAL
  Filled 2023-05-10 (×7): qty 1

## 2023-05-10 MED ORDER — FUROSEMIDE 10 MG/ML IJ SOLN
40.0000 mg | Freq: Once | INTRAMUSCULAR | Status: AC
  Administered 2023-05-10: 40 mg via INTRAVENOUS
  Filled 2023-05-10: qty 4

## 2023-05-10 MED ORDER — ACETAMINOPHEN 650 MG RE SUPP: 650.0000 mg | Freq: Four times a day (QID) | RECTAL | Status: DC | PRN

## 2023-05-10 MED ORDER — POTASSIUM CHLORIDE CRYS ER 20 MEQ PO TBCR
40.0000 meq | EXTENDED_RELEASE_TABLET | Freq: Once | ORAL | Status: AC
  Administered 2023-05-10: 40 meq via ORAL
  Filled 2023-05-10: qty 2

## 2023-05-10 MED ORDER — METHYLPREDNISOLONE SODIUM SUCC 125 MG IJ SOLR
125.0000 mg | Freq: Three times a day (TID) | INTRAMUSCULAR | Status: DC
  Administered 2023-05-10 – 2023-05-11 (×2): 125 mg via INTRAVENOUS
  Filled 2023-05-10 (×2): qty 2

## 2023-05-10 MED ORDER — ENSURE ENLIVE PO LIQD
237.0000 mL | Freq: Two times a day (BID) | ORAL | Status: DC
  Administered 2023-05-11 – 2023-05-17 (×10): 237 mL via ORAL

## 2023-05-10 MED ORDER — ACETAMINOPHEN 325 MG PO TABS: 650.0000 mg | ORAL_TABLET | Freq: Four times a day (QID) | ORAL | Status: DC | PRN

## 2023-05-10 MED ORDER — ALBUTEROL SULFATE (2.5 MG/3ML) 0.083% IN NEBU: 2.5000 mg | INHALATION_SOLUTION | RESPIRATORY_TRACT | Status: DC | PRN

## 2023-05-10 MED ORDER — HYDRALAZINE HCL 20 MG/ML IJ SOLN
10.0000 mg | INTRAMUSCULAR | Status: DC | PRN
  Administered 2023-05-10 – 2023-05-12 (×2): 10 mg via INTRAVENOUS
  Filled 2023-05-10 (×2): qty 1

## 2023-05-10 MED ORDER — CARVEDILOL 12.5 MG PO TABS
12.5000 mg | ORAL_TABLET | Freq: Two times a day (BID) | ORAL | Status: DC
  Administered 2023-05-10 – 2023-05-17 (×14): 12.5 mg via ORAL
  Filled 2023-05-10 (×14): qty 1

## 2023-05-10 MED ORDER — LOSARTAN POTASSIUM 25 MG PO TABS
25.0000 mg | ORAL_TABLET | Freq: Every day | ORAL | Status: DC
  Administered 2023-05-11 – 2023-05-17 (×7): 25 mg via ORAL
  Filled 2023-05-10 (×8): qty 1

## 2023-05-10 MED ORDER — ALBUTEROL SULFATE HFA 108 (90 BASE) MCG/ACT IN AERS
2.0000 | INHALATION_SPRAY | RESPIRATORY_TRACT | Status: DC | PRN
Start: 2023-05-10 — End: 2023-05-10

## 2023-05-10 MED ORDER — BUDESON-GLYCOPYRROL-FORMOTEROL 160-9-4.8 MCG/ACT IN AERO
2.0000 | INHALATION_SPRAY | Freq: Two times a day (BID) | RESPIRATORY_TRACT | Status: DC
  Administered 2023-05-12 – 2023-05-17 (×10): 2 via RESPIRATORY_TRACT
  Filled 2023-05-10: qty 5.9

## 2023-05-10 MED ORDER — CHLORHEXIDINE GLUCONATE CLOTH 2 % EX PADS
6.0000 | MEDICATED_PAD | Freq: Every day | CUTANEOUS | Status: DC
Start: 2023-05-10 — End: 2023-05-17
  Administered 2023-05-10 – 2023-05-17 (×8): 6 via TOPICAL

## 2023-05-10 MED ORDER — VERICIGUAT 2.5 MG PO TABS
2.5000 mg | ORAL_TABLET | Freq: Every day | ORAL | Status: DC
  Administered 2023-05-10 – 2023-05-17 (×8): 2.5 mg via ORAL
  Filled 2023-05-10: qty 1

## 2023-05-10 MED ORDER — HYDRALAZINE HCL 20 MG/ML IJ SOLN
10.0000 mg | Freq: Four times a day (QID) | INTRAMUSCULAR | Status: DC | PRN
  Administered 2023-05-10: 10 mg via INTRAVENOUS
  Filled 2023-05-10: qty 1

## 2023-05-10 MED ORDER — APIXABAN 2.5 MG PO TABS
2.5000 mg | ORAL_TABLET | Freq: Two times a day (BID) | ORAL | Status: DC
  Administered 2023-05-10 – 2023-05-17 (×14): 2.5 mg via ORAL
  Filled 2023-05-10 (×15): qty 1

## 2023-05-10 MED ORDER — HYDRALAZINE HCL 20 MG/ML IJ SOLN: 10.0000 mg | INTRAMUSCULAR | Status: DC | PRN

## 2023-05-10 NOTE — ED Provider Notes (Signed)
 [place] EMERGENCY DEPARTMENT AT [facility] Provider Note   CSN: [id number] Arrival date & time: 05/10/23  1436     History  Chief Complaint  Patient presents with   Shortness of Breath   Dizziness    Michael [name] is a 68 y.o. male.  Pt is a 68 yo male with pmhx significant for head/neck cancer (Right facial basal cell carcinoma s/p resection with right partial mastectomy and partal rhinectomy with skin graft (06/13/17), anemia, htn, copd, ckd, cad, afib (on eliquis), and cardiomyopathy.  Pt presents to the ED today with sob.  He's had a dry cough.  He has had these sx for the past week.  He said he's been taking meds (initial triage BP documented at 199/173 which is not accurate.  Repeat in room by me 155/72).  No fevers.  EPIC note from 4/1 said he was a no show for his CHF clinic appt.  Pt said he did not know about that appt.       Home Medications Prior to Admission medications   Medication Sig Start Date End Date Taking? Authorizing Provider  apixaban (ELIQUIS) 2.5 MG TABS tablet Take 1 tablet (2.5 mg total) by mouth 2 (two) times daily. 04/21/23   Sherryll Burger, Pratik D, DO  atorvastatin (LIPITOR) 40 MG tablet Take 1 tablet (40 mg total) by mouth every evening. Patient taking differently: Take 40 mg by mouth daily. 08/01/22 02/05/24  ShahmehdiGemma Payor, MD  Budeson-Glycopyrrol-Formoterol (BREZTRI AEROSPHERE) 160-9-4.8 MCG/ACT AERO Inhale 2 puffs into the lungs 2 (two) times daily.    [provider]  carvedilol (COREG) 6.25 MG tablet Take 1 tablet (6.25 mg total) by mouth 2 (two) times daily with a meal. 04/27/23   Strader, [place] M, PA-C  furosemide (LASIX) 40 MG tablet Take 1 tablet (40 mg total) by mouth 2 (two) times daily. 04/07/23   Catarina Hartshorn, MD  losartan (COZAAR) 25 MG tablet Take 25 mg by mouth daily. 12/23/22   [provider]  nitroGLYCERIN (NITROSTAT) 0.4 MG SL tablet Place 1 tablet (0.4 mg total) under the tongue every 5 (five) minutes as  needed for chest pain. 02/05/23   Loetta Rough, MD  potassium chloride SA (KLOR-CON M) 20 MEQ tablet Take 1 tablet (20 mEq total) by mouth daily. 07/26/22 02/05/24  Shahmehdi, Gemma Payor, MD  Vericiguat (VERQUVO) 2.5 MG TABS Take 1 tablet (2.5 mg total) by mouth daily. 04/27/23   [place] Ouch, Lennart Pall, PA-C      Allergies    Entresto [sacubitril-valsartan] and Sglt2 inhibitors    Review of Systems   Review of Systems  Respiratory:  Positive for shortness of breath.   All other systems reviewed and are negative.   Physical Exam Updated Vital Signs BP (!) 191/123   Pulse 80   Temp 98.2 F (36.8 C) (Oral)   Resp 19   Ht 6' (1.829 m)   Wt 64.4 kg   SpO2 93%   BMI 19.25 kg/m  Physical Exam Vitals and nursing note reviewed.  Constitutional:      Appearance: He is well-developed.  HENT:     Head:     Comments: S/p significant facial surgery secondary to basal cell    Mouth/Throat:     Mouth: Mucous membranes are moist.     Pharynx: Oropharynx is clear.  Eyes:     Extraocular Movements: Extraocular movements intact.     Pupils: Pupils are equal, round, and reactive to light.  Cardiovascular:  Rate and Rhythm: Normal rate and regular rhythm.     Comments: Multiple PVCs Pulmonary:     Effort: Tachypnea present.  Abdominal:     General: Bowel sounds are normal.     Palpations: Abdomen is soft.  Musculoskeletal:        General: Normal range of motion.     Cervical back: Normal range of motion and neck supple.  Skin:    General: Skin is warm and dry.     Capillary Refill: Capillary refill takes less than 2 seconds.  Neurological:     General: No focal deficit present.     Mental Status: He is alert and oriented to person, place, and time.  Psychiatric:        Mood and Affect: Mood normal.        Behavior: Behavior normal.     ED Results / Procedures / Treatments   Labs (all labs ordered are listed, but only abnormal results are displayed) Labs Reviewed  BASIC METABOLIC  PANEL WITH GFR - Abnormal; Notable for the following components:      Result Value   Glucose, Bld 133 (*)    BUN 30 (*)    Creatinine, Ser 1.74 (*)    GFR, Estimated 42 (*)    All other components within normal limits  BRAIN NATRIURETIC PEPTIDE - Abnormal; Notable for the following components:   B Natriuretic Peptide >4,500.0 (*)    All other components within normal limits  HEPATIC FUNCTION PANEL - Abnormal; Notable for the following components:   AST 63 (*)    ALT 63 (*)    Alkaline Phosphatase 252 (*)    Total Bilirubin 1.5 (*)    Bilirubin, Direct 0.4 (*)    Indirect Bilirubin 1.1 (*)    All other components within normal limits  CBC WITH DIFFERENTIAL/PLATELET - Abnormal; Notable for the following components:   Hemoglobin 12.6 (*)    RDW 16.3 (*)    All other components within normal limits  TROPONIN I (HIGH SENSITIVITY) - Abnormal; Notable for the following components:   Troponin I (High Sensitivity) 86 (*)    All other components within normal limits  TROPONIN I (HIGH SENSITIVITY) - Abnormal; Notable for the following components:   Troponin I (High Sensitivity) 88 (*)    All other components within normal limits  RESP PANEL BY RT-PCR (RSV, FLU A&B, COVID)  RVPGX2  MAGNESIUM    EKG EKG Interpretation Date/Time:  Wednesday May 10 2023 15:19:57 EDT Ventricular Rate:  97 PR Interval:  262 QRS Duration:  132 QT Interval:  442 QTC Calculation: 561 R Axis:   -73  Text Interpretation: Sinus rhythm with 1st degree A-V block with frequent Premature ventricular complexes Possible Left atrial enlargement Left axis deviation Left ventricular hypertrophy with QRS widening and repolarization abnormality ( Sokolow-Lyon , Cornell product ) Cannot rule out Septal infarct , age undetermined Abnormal ECG When compared with ECG of 26-Apr-2023 12:07, PREVIOUS ECG IS PRESENT Confirmed by Jacalyn Lefevre [phone]) on 05/10/2023 3:28:48 PM  Radiology DG Chest Port 1 View Result Date:  05/10/2023 CLINICAL DATA:  Shortness of breath. EXAM: PORTABLE CHEST 1 VIEW COMPARISON:  Chest radiograph dated 04/26/2023. FINDINGS: Cardiomegaly with vascular congestion. No focal consolidation, pleural effusion or pneumothorax. Atherosclerotic calcification of the aortic arch. No acute osseous pathology. IMPRESSION: Cardiomegaly with vascular congestion. Electronically Signed   By: Elgie Collard M.D.   On: 05/10/2023 17:35    Procedures Procedures    Medications Ordered in ED  Medications  ipratropium-albuterol (DUONEB) 0.5-2.5 (3) MG/3ML nebulizer solution 3 mL (3 mLs Nebulization Given 05/10/23 1603)  furosemide (LASIX) injection 40 mg (40 mg Intravenous Given 05/10/23 1659)    ED Course/ Medical Decision Making/ A&P                                 Medical Decision Making Amount and/or Complexity of Data Reviewed Labs: ordered. Radiology: ordered.  Risk Prescription drug management. Decision regarding hospitalization.   This patient presents to the ED for concern of sob, this involves an extensive number of treatment options, and is a complaint that carries with it a high risk of complications and morbidity.  The differential diagnosis includes chf, covid/flu/rsv, pna, copd   Co morbidities that complicate the patient evaluation  head/neck cancer (Right facial basal cell carcinoma s/p resection with right partial mastectomy and partal rhinectomy with skin graft (06/13/17), anemia, htn, copd, ckd, cad, afib (on eliquis), and cardiomyopathy   Additional history obtained:  Additional history obtained from epic chart review  Lab Tests:  I Ordered, and personally interpreted labs.  The pertinent results include:  BNP >4500, bmp with bun 30 and cr 1.74 (cr 1.55 on 3/26), trop elevated at 86, mg nl at 2.2   Imaging Studies ordered:  I ordered imaging studies including cxr  I independently visualized and interpreted imaging which showed Cardiomegaly with vascular congestion.   I agree with the radiologist interpretation   Cardiac Monitoring:  The patient was maintained on a cardiac monitor.  I personally viewed and interpreted the cardiac monitored which showed an underlying rhythm of: nsr   Medicines ordered and prescription drug management:  I ordered medication including lasix/duoneb  for sx  Reevaluation of the patient after these medicines showed that the patient improved I have reviewed the patients home medicines and have made adjustments as needed   Test Considered:  nsr   Critical Interventions:  lasix   Consultations Obtained:  I requested consultation with the [place]y,  and discussed lab and imaging findings as well as pertinent plan - he will admit   Problem List / ED Course:  Elevated trop:  likely due to work of breathing.  Will need to trend. SOB due to CHF:  lasix improved sob, but he is still sob.  He is not requiring oxygen, but is tachypneic.   Reevaluation:  After the interventions noted above, I reevaluated the patient and found that they have :improved   Social Determinants of Health:  Lives at home   Dispostion:  After consideration of the diagnostic results and the patients response to treatment, I feel that the patent would benefit from admission.          Final Clinical Impression(s) / ED Diagnoses Final diagnoses:  Acute on chronic congestive heart failure, unspecified heart failure type (HCC)  Elevated troponin  AKI (acute kidney injury) [facility])    Rx / DC Orders ED Discharge Orders     None         Jacalyn Lefevre, MD 05/10/23 1845

## 2023-05-10 NOTE — ED Triage Notes (Signed)
 Pt arrived POV with c/o dizziness, SOB since last week.

## 2023-05-10 NOTE — H&P (Signed)
 [facility] H&P   Patient Demographics:    Michael [name], is a 68 y.o. male  MRN: [id number]   DOB - [birth year]  Admit Date - 05/10/2023  Outpatient Primary MD for the patient is [name], [name], MD  Referring MD/NP/PA: Dr. Particia Nearing  Outpatient Specialists: [facility] cardiology  Patient coming from: Home  Chief Complaint  Patient presents with   Shortness of Breath   Dizziness      HPI:    Michael [name]  is a 68 y.o. male, Michael [name] is a 68 y.o. male with medical history significant for systolic and diastolic CHF, atrial fibrillation, COPD, hypertension, CKD 3, BPH.  Patient with multiple admissions in the past for the same, as well with known history of cholelithiasis, high risk for surgery, patient presents to ED secondary to complaints of shortness of breath over the last few days, patient usually noncompliant with medication, but reports he has been compliant recently, unclear if he has been compliant with fluid restrictions, he denies chest pain, reports shortness of breath and dizziness. - In ED patient workup significant for elevated BNP> 4500, which is his baseline, Creatinine at baseline of 1.74, troponins 86> 88, at baseline, chest x-ray with evidence of vascular congestion and cardiomegaly, blood pressure was significantly elevated with systolic in the 180s, patient received IV Lasix, [facility] hospitalist consulted to admit.   Review of systems:      A full 10 point Review of Systems was done, except as stated above, all other Review of Systems were negative.   With Past History of the following :    Past Medical History:  Diagnosis Date   Aortic atherosclerosis (HCC) 08/03/2021   Atrial fibrillation (HCC)    CAD (coronary artery disease) 08/03/2021   Cardiac catheterization September 2023 with RCA/RV marginal stenosis managed medically   Cardiomyopathy (HCC)     Carotid artery disease (HCC)    CKD (chronic kidney disease) stage 3, GFR 30-59 ml/min (HCC)    COPD (chronic obstructive pulmonary disease) (HCC)    Essential hypertension    Head and neck cancer (HCC) 2019   Right facial basal cell carcinoma s/p resection with right partial mastectomy and partal rhinectomy with skin graft (06/13/17)   Iron deficiency anemia    Mass of colon 08/06/2021   Formatting of this note might be different from the original. Last Assessment & Plan:  Formatting of this note might be different from the original.  Status post right hemicolectomy by Dr. Henreitta Leber today 7/12.    -Will check postop CBC and BMP     Urinary retention       Past Surgical History:  Procedure Laterality Date   ANKLE CLOSED REDUCTION Right    open reduction   BASAL CELL CARCINOMA EXCISION  2019   at [facility]   BIOPSY  07/19/2021   Procedure: BIOPSY;  Surgeon: Lanelle Bal, DO;  Location: [facility];  Service: Endoscopy;;   CATARACT EXTRACTION W/PHACO Left 01/23/2023   Procedure: CATARACT EXTRACTION PHACO AND INTRAOCULAR LENS PLACEMENT (IOC);  Surgeon: [name], MD;  Location: [facility] ORS;  Service: Ophthalmology;  Laterality: Left;  CDE: 23.26   COLONOSCOPY N/A 05/31/2017   Procedure: COLONOSCOPY;  Surgeon: Corbin Ade, MD;  Location: [facility];  Service: Endoscopy;  Laterality: N/A;  2:45pm   COLONOSCOPY WITH PROPOFOL N/A 07/19/2021   Procedure: COLONOSCOPY WITH PROPOFOL;  Surgeon: Lanelle Bal, DO;  Location: [facility];  Service: Endoscopy;  Laterality: N/A;  3:00pm, moved up to 9:00   CYSTOSCOPY N/A 10/29/2018   Procedure: CYSTOSCOPY, CLOT EVACUATION;  Surgeon: Rene Paci, MD;  Location: [facility] ORS;  Service: Urology;  Laterality: N/A;   PARTIAL COLECTOMY Right 08/11/2021   Procedure: PARTIAL COLECTOMY, OPEN RIGHT HEMICOLECTOMY;  Surgeon: Lucretia Roers, MD;  Location: [facility] ORS;  Service: General;  Laterality: Right;   POLYPECTOMY  05/31/2017   Procedure:  POLYPECTOMY;  Surgeon: Corbin Ade, MD;  Location: [facility];  Service: Endoscopy;;   RIGHT/LEFT HEART CATH AND CORONARY ANGIOGRAPHY N/A 10/22/2021   Procedure: RIGHT/LEFT HEART CATH AND CORONARY ANGIOGRAPHY;  Surgeon: Orbie Pyo, MD;  Location: [facility] INV[facility] LAB;  Service: Cardiovascular;  Laterality: N/A;   TONSILLECTOMY     TRANSCAROTID ARTERY REVASCULARIZATION  Right 02/15/2021   Procedure: RIGHT TRANSCAROTID ARTERY REVASCULARIZATION;  Surgeon: Cephus Shelling, MD;  Location: [facility] OR;  Service: Vascular;  Laterality: Right;   ULTRASOUND GUIDANCE FOR VASCULAR ACCESS Left 02/15/2021   Procedure: ULTRASOUND GUIDANCE FOR VASCULAR ACCESS;  Surgeon: Cephus Shelling, MD;  Location: [facility] OR;  Service: Vascular;  Laterality: Left;   XI ROBOTIC ASSISTED SIMPLE PROSTATECTOMY N/A 10/29/2018   Procedure: XI ROBOTIC ASSISTED SIMPLE PROSTATECTOMY;  Surgeon: Malen Gauze, MD;  Location: [facility] OR[facility];  Service: Urology;  Laterality: N/A;      Social History:     Social History   Tobacco Use   Smoking status: Former    Current packs/day: 0.00    Average packs/day: 0.3 packs/day for 40.0 years (10.0 ttl pk-yrs)    Types: Cigarettes    Start date: 07/02/1981    Quit date: 07/02/2021    Years since quitting: 1.8    Passive exposure: Past   Smokeless tobacco: Never   Tobacco comments:    Quit on 06/09/21  Substance Use Topics   Alcohol use: Not Currently       Family History :     Family History  Problem Relation Age of Onset   Stroke Father    Cirrhosis Mother    Colon cancer Neg Hx       Home Medications:   Prior to Admission medications   Medication Sig Start Date End Date Taking? Authorizing Provider  apixaban (ELIQUIS) 2.5 MG TABS tablet Take 1 tablet (2.5 mg total) by mouth 2 (two) times daily. 04/21/23   Sherryll Burger, Pratik D, DO  atorvastatin (LIPITOR) 40 MG tablet Take 1 tablet (40 mg total) by mouth every evening. Patient taking differently: Take 40 mg by mouth  daily. 08/01/22 02/05/24  ShahmehdiGemma Payor, MD  Budeson-Glycopyrrol-Formoterol (BREZTRI AEROSPHERE) 160-9-4.8 MCG/ACT AERO Inhale 2 puffs into the lungs 2 (two) times daily.    [provider]  carvedilol (COREG) 6.25 MG tablet Take 1 tablet (6.25 mg total) by mouth 2 (two) times daily with a meal. 04/27/23   Strader, [place] M, PA-C  furosemide (LASIX) 40 MG  tablet Take 1 tablet (40 mg total) by mouth 2 (two) times daily. 04/07/23   Catarina Hartshorn, MD  losartan (COZAAR) 25 MG tablet Take 25 mg by mouth daily. 12/23/22   [provider]  nitroGLYCERIN (NITROSTAT) 0.4 MG SL tablet Place 1 tablet (0.4 mg total) under the tongue every 5 (five) minutes as needed for chest pain. 02/05/23   Loetta Rough, MD  potassium chloride SA (KLOR-CON M) 20 MEQ tablet Take 1 tablet (20 mEq total) by mouth daily. 07/26/22 02/05/24  Shahmehdi, Gemma Payor, MD  Vericiguat (VERQUVO) 2.5 MG TABS Take 1 tablet (2.5 mg total) by mouth daily. 04/27/23   Ellsworth Lennox, PA-C     Allergies:     Allergies  Allergen Reactions   Entresto [Sacubitril-Valsartan]     Orthostatic Hypotension   Sglt2 Inhibitors     UTI's     Physical Exam:   Vitals  Blood pressure (!) 207/113, pulse 83, temperature 98.2 F (36.8 C), temperature source Oral, resp. rate (!) 22, height 6' (1.829 m), weight 64.4 kg, SpO2 94%.   1. General Frail, chronically ill appearing, sitting in bed mildly tachypneic  2. Normal affect and insight, Not Suicidal or Homicidal, Awake Alert, Oriented X 3.  3. No F.N deficits, ALL C.Nerves Intact, Strength 5/5 all 4 extremities, Sensation intact all 4 extremities, Plantars down going.  4.  Patient with facial deformity and skin flap  5. Supple Neck, No JVD, No cervical lymphadenopathy appriciated, No Carotid Bruits.  6. Symmetrical Chest wall movement, tachypneic with Rales  7.  Irregular irregular, No Gallops, Rubs or Murmurs, No Parasternal Heave.  +1 edema  8. Positive Bowel Sounds,  Abdomen Soft, No tenderness, No organomegaly appriciated,No rebound -guarding or rigidity.  9.  No Cyanosis, Normal Skin Turgor, No Skin Rash or Bruise.  10. Good muscle tone, has significant generalized muscle wasting, joints appear normal , no effusions, Normal ROM.     Data Review:    CBC Recent Labs  Lab 05/10/23 1659  WBC 7.8  HGB 12.6*  HCT 40.4  PLT 223  MCV 88.4  MCH 27.6  MCHC 31.2  RDW 16.3*  LYMPHSABS 1.8  MONOABS 0.6  EOSABS 0.1  BASOSABS 0.1   ------------------------------------------------------------------------------------------------------------------  Chemistries  Recent Labs  Lab 05/10/23 1539  NA 138  K 3.8  CL 104  CO2 23  GLUCOSE 133*  BUN 30*  CREATININE 1.74*  CALCIUM 9.3  MG 2.2  AST 63*  ALT 63*  ALKPHOS 252*  BILITOT 1.5*   ------------------------------------------------------------------------------------------------------------------ estimated creatinine clearance is 37 mL/min (A) (by C-G formula based on SCr of 1.74 mg/dL (H)). ------------------------------------------------------------------------------------------------------------------ No results for input(s): "TSH", "T4TOTAL", "T3FREE", "THYROIDAB" in the last 72 hours.  Invalid input(s): "FREET3"  Coagulation profile No results for input(s): "INR", "PROTIME" in the last 168 hours. ------------------------------------------------------------------------------------------------------------------- No results for input(s): "DDIMER" in the last 72 hours. -------------------------------------------------------------------------------------------------------------------  Cardiac Enzymes No results for input(s): "CKMB", "TROPONINI", "MYOGLOBIN" in the last 168 hours.  Invalid input(s): "CK" ------------------------------------------------------------------------------------------------------------------    Component Value Date/Time   BNP >4,500.0 (H) 05/10/2023 1539      ---------------------------------------------------------------------------------------------------------------  Urinalysis    Component Value Date/Time   COLORURINE YELLOW 04/26/2023 1652   APPEARANCEUR CLEAR 04/26/2023 1652   APPEARANCEUR Cloudy (A) 06/16/2021 0936   LABSPEC 1.013 04/26/2023 1652   LABSPEC 1.018 04/23/2011 1044   PHURINE 6.0 04/26/2023 1652   GLUCOSEU NEGATIVE 04/26/2023 1652   GLUCOSEU Negative 04/23/2011 1044   HGBUR NEGATIVE 04/26/2023 1652  BILIRUBINUR NEGATIVE 04/26/2023 1652   BILIRUBINUR Negative 06/16/2021 0936   BILIRUBINUR Negative 04/23/2011 1044   KETONESUR NEGATIVE 04/26/2023 1652   PROTEINUR NEGATIVE 04/26/2023 1652   NITRITE NEGATIVE 04/26/2023 1652   LEUKOCYTESUR MODERATE (A) 04/26/2023 1652   LEUKOCYTESUR Trace 04/23/2011 1044    ----------------------------------------------------------------------------------------------------------------   Imaging Results:    DG Chest Port 1 View Result Date: 05/10/2023 CLINICAL DATA:  Shortness of breath. EXAM: PORTABLE CHEST 1 VIEW COMPARISON:  Chest radiograph dated 04/26/2023. FINDINGS: Cardiomegaly with vascular congestion. No focal consolidation, pleural effusion or pneumothorax. Atherosclerotic calcification of the aortic arch. No acute osseous pathology. IMPRESSION: Cardiomegaly with vascular congestion. Electronically Signed   By: Elgie Collard M.D.   On: 05/10/2023 17:35    EKG:  Vent. rate 106 BPM PR interval * ms QRS duration 130 ms QT/QTcB 382/508 ms P-R-T axes * 237 106 Atrial fibrillation Probable lateral infarct, age indeterminate Probable anterior infarct, age indeterminate Prolonged QT interval  Assessment & Plan:    Active Problems:   CAD (coronary artery disease)   CKD stage 3b, GFR 30-44 ml/min (HCC)   Atrial fibrillation (HCC)   Anemia of chronic disease   Tobacco abuse   Prolonged QT interval   COPD (chronic obstructive pulmonary disease) (HCC)   Noncompliance  with treatment plan   Calculus of gallbladder without cholecystitis without obstruction   Acute on chronic combined systolic (congestive) and diastolic (congestive) heart failure (HCC)   Hypertensive urgency    Hypertensive emergency - Pressure significantly elevated, likely contributing to his CHF. - Patient reports compliance with medication (even though with history of noncompliance in the past) - Will start on as needed hydralazine pushes, and will give 1 dose of Coreg for now, but if blood pressure remains elevated will consider nicardipine versus hydralazine drip.   Acute on chronic combined systolic and diastolic CHF -Patient with known history of low EF at 25%, poor compliance with fluid restriction and medications. - Volume overload on imaging, has +1 edema, and elevated BNP -Will continue with IV Lasix 40 mg IV twice daily. - Will give an extra dose of Lasix in couple hours if blood pressure remains elevated and he remains dyspneic. - Continue with Coreg. - Reviewing cardiology note he has been on Crestor due to orthostatic hypotension in the past, and not on SGLT 2 inhibitors due to frequent UTIs. - Continue with daily weights, strict ins and out. -Continue with home Verquovo   Paroxysmal atrial fibrillation Sinew with Eliquis for anticoagulation Continue with Coreg, will give 12.5 mg p.o. twice daily as blood pressure is elevated (Home dose 6.25 mg p.o. twice daily   Chronic obstructive pulmonary disease (HCC) He is with no active wheezing, but I will give 1 dose of steroids given increased work of breathing   CKD stage 3b, GFR 30-44 ml/min (HCC) - Function appears at baseline, continue to monitor as on IV diuresis  Tobacco abuse -He was counseled, reports he cut down, started on nicotine patch  CAD -Troponins elevated, but non-ACS pattern, at baseline -Continue with Coreg, losartan, and statin, not on aspirin due to being on Eliquis  Prolonged QTc - EKG showing  prolonged QTc 561, repeat showing QTc at 508, magnesium above 2, potassium is 3.8, will replace specially he is on IV Lasix, will monitor on telemetry  Goals of care discussion - Patient with frequent admissions, and ED visits, with significant subjective dyspnea, he is with COPD, still smoking, with significant cardiomyopathy with low EF, poor functional status, I  have discussed with him cessation of palliative care medicine to address goals of care and symptom management, he is agreeable, so consult was placed   DVT Prophylaxis Eliquis  AM Labs Ordered, also please review Full Orders  Family Communication: Admission, patients condition and plan of care including tests being ordered have been discussed with the patient who indicate understanding and agree with the plan and Code Status.  Code Status full code  Likely DC to home  Consults called: Palliative care consult requested in epic  Admission status: Inpatient  Time spent in minutes : 75 minutes   Huey Bienenstock M.D on 05/10/2023 at 6:48 PM   [facility] - Office  [phone]

## 2023-05-10 NOTE — TOC Initial Note (Signed)
 Transition of Care [facility]) - Initial/Assessment Note   Patient Details  Name: Michael [name] MRN: [id number] Date of Birth: 12-23-1955  Transition of Care [facility]) CM/SW Contact:    Barron Alvine, RN Phone Number: 05/10/2023, 9:55 PM  Clinical Narrative:                 Pt is high risk for readmission and well known to [facility] dept. Per chart review he has a hx of non-compliance. Pt currently lives c/friends, has transportation available, no difficulties getting prescriptions filled. Pt plans to dc home. TOC to follow.   Expected Discharge Plan: Home/Self Care Barriers to Discharge: Continued Medical Work up  Patient Goals and CMS Choice Patient states their goals for this hospitalization and ongoing recovery are:: Return home CMS Medicare.gov Compare Post Acute Care list provided to:: Patient Choice offered to / list presented to : Patient [facility] ownership interest in [facility].provided to:: Patient   Expected Discharge Plan and Services In-house Referral: Clinical Social Work Discharge Planning Services: CM Consult   Living arrangements for the past 2 months: Single Family Home                 Prior Living Arrangements/Services Living arrangements for the past 2 months: Single Family Home Lives with:: Friends Patient language and need for interpreter reviewed:: Yes Do you feel safe going back to the place where you live?: Yes      Need for Family Participation in Patient Care: Yes (Comment) Care giver support system in place?: Yes (comment)   Criminal Activity/Legal Involvement Pertinent to Current Situation/Hospitalization: No - Comment as needed  Activities of Daily Living   ADL Screening (condition at time of admission) Independently performs ADLs?: Yes (appropriate for developmental age) Is the patient deaf or have difficulty hearing?: No Does the patient have difficulty seeing, even when wearing glasses/contacts?: No Does the patient have difficulty  concentrating, remembering, or making decisions?: No  Permission Sought/Granted   Emotional Assessment   Attitude/Demeanor/Rapport: Engaged Affect (typically observed): Appropriate Orientation: : Oriented to Self, Oriented to Place, Oriented to  Time, Oriented to Situation Alcohol / Substance Use: Not Applicable Psych Involvement: No (comment)  Admission diagnosis:  Elevated troponin [R79.89] Hypertensive urgency [I16.0] AKI (acute kidney injury) (HCC) [N17.9] Acute on chronic congestive heart failure, unspecified heart failure type (HCC) [I50.9] Patient Active Problem List   Diagnosis Date Noted   Hypertensive urgency 05/10/2023   Acute on chronic combined systolic (congestive) and diastolic (congestive) heart failure (HCC) 04/18/2023   Atrial fibrillation (HCC) 02/05/2023   Body mass index (BMI) 27.0-27.9, adult 12/01/2022   H/O prostatectomy 12/01/2022   Malnutrition of moderate degree 11/24/2022   Chronic obstructive pulmonary disease (HCC) 11/22/2022   Chronic diastolic CHF (congestive heart failure) (HCC) 11/22/2022   Calculus of gallbladder without cholecystitis without obstruction 08/10/2022   Acute on chronic combined systolic and diastolic CHF (congestive heart failure) (HCC) 07/24/2022   Nicotine dependence, cigarettes, uncomplicated 05/17/2022   Noncompliance with treatment plan 05/17/2022   Orthostatic hypotension 05/17/2022   Vitamin D deficiency 05/17/2022   Elevated brain natriuretic peptide (BNP) level 04/29/2022   Solitary pulmonary nodule 04/26/2022   First degree heart block 04/12/2022   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   COPD (chronic obstructive pulmonary disease) (HCC) 11/03/2021   Aneurysm of infrarenal abdominal aorta (HCC) 11/03/2021   Functional gait abnormality 11/03/2021   Mixed hyperlipidemia 11/03/2021   Moderate protein-calorie malnutrition (HCC) 11/03/2021   Physical deconditioning 11/03/2021   Proteinuria  11/03/2021   Atrophy of  pancreas 08/11/2021   CAD (coronary artery disease) 08/03/2021   CKD stage 3b, GFR 30-44 ml/min (HCC) 08/03/2021   Hardening of the aorta (main artery of the heart) (HCC) 08/03/2021   History of adenomatous polyp of colon 06/16/2021   Carotid stenosis, right 02/15/2021   Occlusion and stenosis of right carotid artery 02/15/2021   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 12/06/2019   Incisional hernia 11/15/2019   Benign prostatic hyperplasia with urinary obstruction 10/29/2018   Chronic combined systolic and diastolic CHF (congestive heart failure) (HCC) 05/09/2018   Incomplete bladder emptying 07/05/2017   Disorder of carotid artery (HCC) 06/25/2017   Orthostatic syncope    Syncope due to orthostatic hypotension 06/24/2017   Cardiac murmur 06/24/2017   Prolonged QT interval 06/24/2017   Basal cell carcinoma of eyelid 05/15/2017   Basal cell carcinoma (BCC) of nostril 05/12/2017   Skin lesion of face 05/03/2017   Other specified disorders of nose and nasal sinuses 05/03/2017   Iron deficiency anemia 04/04/2017   Essential (primary) hypertension 04/02/2017   Anemia of chronic disease 04/02/2017   Tobacco abuse 04/02/2017   Hypokalemia 04/02/2017   Nasal lesion    PCP:  Benita Stabile, MD Pharmacy:   [facility] - [place], [place] - [address] [place] [address] Phone: [phone] Fax: [phone]  Social Drivers of Health (SDOH) Social History: SDOH Screenings   Food Insecurity: No Food Insecurity (05/10/2023)  Housing: Low Risk  (05/10/2023)  Recent Concern: Housing - High Risk (03/31/2023)  Transportation Needs: No Transportation Needs (05/10/2023)  Utilities: Not At Risk (05/10/2023)  Alcohol Screen: Low Risk  (12/26/2022)  Depression (PHQ2-9): Low Risk  (04/24/2023)  Financial Resource Strain: Low Risk  (12/26/2022)  Physical Activity: Sufficiently Active (12/26/2022)  Social Connections: Socially Isolated (05/10/2023)  Stress: No  Stress Concern Present (12/26/2022)  Tobacco Use: Medium Risk (05/02/2023)  Health Literacy: Adequate Health Literacy (12/26/2022)   Readmission Risk Interventions    05/10/2023    9:47 PM 04/19/2023    9:18 AM 04/02/2023    4:13 PM  Readmission Risk Prevention Plan  Transportation Screening Complete Complete Complete  Medication Review (RN Care Manager) Complete Complete Complete  PCP or Specialist appointment within 3-5 days of discharge Complete  Complete  HRI or Home Care Consult Complete Complete Complete  SW Recovery Care/Counseling Consult Complete Complete Complete  Palliative Care Screening Not Applicable Not Applicable Not Applicable  Skilled Nursing Facility Not Applicable Not Applicable Not Applicable

## 2023-05-10 NOTE — ED Notes (Signed)
 Prn hydralazine given for high BP, 207/113.

## 2023-05-10 NOTE — Progress Notes (Signed)
 Checked with patient about home med [place], and patient informed this RT that he did not bring his medication to the hospital.  Patient uses twice daily at home.

## 2023-05-11 ENCOUNTER — Encounter (HOSPITAL_COMMUNITY): Payer: Self-pay | Admitting: Internal Medicine

## 2023-05-11 DIAGNOSIS — Z91199 Patient's noncompliance with other medical treatment and regimen due to unspecified reason: Secondary | ICD-10-CM

## 2023-05-11 DIAGNOSIS — Z515 Encounter for palliative care: Secondary | ICD-10-CM

## 2023-05-11 DIAGNOSIS — D638 Anemia in other chronic diseases classified elsewhere: Secondary | ICD-10-CM | POA: Diagnosis not present

## 2023-05-11 DIAGNOSIS — J449 Chronic obstructive pulmonary disease, unspecified: Secondary | ICD-10-CM

## 2023-05-11 DIAGNOSIS — I2510 Atherosclerotic heart disease of native coronary artery without angina pectoris: Secondary | ICD-10-CM

## 2023-05-11 DIAGNOSIS — Z720 Tobacco use: Secondary | ICD-10-CM

## 2023-05-11 DIAGNOSIS — I5043 Acute on chronic combined systolic (congestive) and diastolic (congestive) heart failure: Secondary | ICD-10-CM | POA: Diagnosis not present

## 2023-05-11 DIAGNOSIS — Z7189 Other specified counseling: Secondary | ICD-10-CM | POA: Diagnosis not present

## 2023-05-11 DIAGNOSIS — R9431 Abnormal electrocardiogram [ECG] [EKG]: Secondary | ICD-10-CM

## 2023-05-11 DIAGNOSIS — K8020 Calculus of gallbladder without cholecystitis without obstruction: Secondary | ICD-10-CM

## 2023-05-11 LAB — CBC
HCT: 38.4 % — ABNORMAL LOW (ref 39.0–52.0)
Hemoglobin: 12 g/dL — ABNORMAL LOW (ref 13.0–17.0)
MCH: 27.7 pg (ref 26.0–34.0)
MCHC: 31.3 g/dL (ref 30.0–36.0)
MCV: 88.7 fL (ref 80.0–100.0)
Platelets: 206 10*3/uL (ref 150–400)
RBC: 4.33 MIL/uL (ref 4.22–5.81)
RDW: 16.4 % — ABNORMAL HIGH (ref 11.5–15.5)
WBC: 3.1 10*3/uL — ABNORMAL LOW (ref 4.0–10.5)
nRBC: 0 % (ref 0.0–0.2)

## 2023-05-11 LAB — BASIC METABOLIC PANEL WITH GFR
Anion gap: 8 (ref 5–15)
BUN: 34 mg/dL — ABNORMAL HIGH (ref 8–23)
CO2: 24 mmol/L (ref 22–32)
Calcium: 8.9 mg/dL (ref 8.9–10.3)
Chloride: 107 mmol/L (ref 98–111)
Creatinine, Ser: 1.74 mg/dL — ABNORMAL HIGH (ref 0.61–1.24)
GFR, Estimated: 42 mL/min — ABNORMAL LOW (ref >60)
Glucose, Bld: 280 mg/dL — ABNORMAL HIGH (ref 70–99)
Potassium: 4.2 mmol/L (ref 3.5–5.1)
Sodium: 139 mmol/L (ref 135–145)

## 2023-05-11 LAB — MRSA NEXT GEN BY PCR, NASAL: MRSA by PCR Next Gen: NOT DETECTED

## 2023-05-11 LAB — RESP PANEL BY RT-PCR (RSV, FLU A&B, COVID)  RVPGX2
Influenza A by PCR: NEGATIVE
Influenza B by PCR: NEGATIVE
Resp Syncytial Virus by PCR: NEGATIVE
SARS Coronavirus 2 by RT PCR: NEGATIVE

## 2023-05-11 MED ORDER — FUROSEMIDE 10 MG/ML IJ SOLN
40.0000 mg | Freq: Two times a day (BID) | INTRAMUSCULAR | Status: DC
  Administered 2023-05-11 – 2023-05-13 (×5): 40 mg via INTRAVENOUS
  Filled 2023-05-11 (×5): qty 4

## 2023-05-11 NOTE — Consult Note (Signed)
 Consultation Note Date: 05/11/2023   Patient Name: Michael [name]  DOB: Jun 01, 1955  MRN: [id number]  Age / Sex: 68 y.o., male  PCP: Benita Stabile, MD Referring Physician: Cleora Fleet, MD  Reason for Consultation: Establishing goals of care  HPI/Patient Profile: 67 y.o. male  with past medical history of systolic and diastolic CHF, atrial fibrillation, COPD, hypertension, CKD 3, BPH.  Patient with multiple admissions admitted on 05/10/2023 with hypertensive emergency with acute heart failure.   Clinical Assessment and Goals of Care: I have reviewed medical records including EPIC notes, labs and imaging, received report from RN, assessed the patient.  Michael [name] is sitting up in the bed in the intensive care.  He appears acutely/chronically ill and somewhat frail.  He greets [name], making and mostly keeping eye contact.  He is alert and oriented x 3, able to make his needs known.  There is no family at bedside at this time.  We meet at the bedside to discuss diagnosis prognosis, GOC, EOL wishes, disposition and options. I introduced Palliative Medicine as specialized medical care for people living with serious [name]. It focuses on providing relief from the symptoms and stress of a serious [name]. The goal is to improve quality of life for both the patient and the family.  We discussed a brief life review of the patient.  Michael [name] is still living with friends.  He is independent with ADLs.  He plans to return there. 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  He is currently living with a friend, Michael [name] and Michael [name].  He tells [name] that he is independent with ADLs, does not drive because he does not have a car.  Michael [name] and he go together to get groceries.  He manages his own finances.  He is unmarried, his  parents are deceased.  He has 1 child, son Michael [name] who lives in [place].   We then focused on their current [name].  We talked about Michael [name], his heart failure and high blood pressure.  We talked about the treatment plan.  We talked about time for outcomes.  We talked about disposition.  The natural disease trajectory and expectations at EOL were discussed.  Advanced directives, concepts specific to code status, artifical feeding and hydration, and rehospitalization were considered and discussed.  We talked about the concept of "treat the treatable, but allow a natural passing".  I shared that he had told [name] in the past that he wanted DNR.  This is changed.  He states that he would accept attempted resuscitation and short-term life support.  He states he would not want tracheotomy.  Hospice and Palliative Care services outpatient were explained and offered.  Michael [name] that he will consider outpatient palliative services.  Discussed the importance of continued conversation with family and the medical providers regarding overall plan of care and treatment options, ensuring decisions are within the context  of the patient's values and GOCs.  Questions and concerns were addressed.  The family was encouraged to call with questions or concerns.  PMT will continue to support holistically.  Conference with attending, bedside nursing staff, transition of care team related to patient condition, needs, goals of care, disposition.    HCPOA  NEXT OF KIN -Michael [name] tells [name] that his son who lives in [place], Michael [name], would be his healthcare surrogate.    SUMMARY OF RECOMMENDATIONS   Continue to treat the treatable Full scope/full code, but would not accept tracheotomy Continue to rehospitalize as needed Return to current living conditions with friends. Considering outpatient palliative services.    Code Status/Advance Care Planning: Full code - We talked about  the concept of "treat the treatable, but allow a natural passing".  I shared that he had told [name] in the past that he wanted DNR.  This is changed.  He states that he would accept attempted resuscitation and short-term life support.  He states he would not want tracheotomy.  Symptom Management:  Per hospitalist, no additional needs at this time.  Palliative Prophylaxis:  Frequent Pain Assessment and Oral Care  Additional Recommendations (Limitations, Scope, Preferences): Full Scope Treatment  Psycho-social/Spiritual:  Desire for further Chaplaincy support:no Additional Recommendations: Caregiving  Support/Resources and Education on Hospice  Prognosis:  < 3 months, would not be surprising based on 6 hospital stays and 2 ED visits in the last 6 months, chronic [name] burden, decreasing functional status  Discharge Planning: Anticipate returning to his friend's house, would benefit from outpatient palliative/hospice care but continues to decline.        Primary Diagnoses: Present on Admission:  Hypertensive urgency  Acute on chronic combined systolic (congestive) and diastolic (congestive) heart failure (HCC)  Anemia of chronic disease  Atrial fibrillation (HCC)  CAD (coronary artery disease)  Calculus of gallbladder without cholecystitis without obstruction  CKD stage 3b, GFR 30-44 ml/min (HCC)  COPD (chronic obstructive pulmonary disease) (HCC)  Prolonged QT interval  Tobacco abuse   I have reviewed the medical record, interviewed the patient and family, and examined the patient. The following aspects are pertinent.  Past Medical History:  Diagnosis Date   Aortic atherosclerosis (HCC) 08/03/2021   Atrial fibrillation (HCC)    CAD (coronary artery disease) 08/03/2021   Cardiac catheterization September 2023 with RCA/RV marginal stenosis managed medically   Cardiomyopathy (HCC)    Carotid artery disease (HCC)    CKD (chronic kidney disease) stage 3, GFR 30-59 ml/min (HCC)     COPD (chronic obstructive pulmonary disease) (HCC)    Essential hypertension    Head and neck cancer (HCC) 2019   Right facial basal cell carcinoma s/p resection with right partial mastectomy and partal rhinectomy with skin graft (06/13/17)   Iron deficiency anemia    Mass of colon 08/06/2021   Formatting of this note might be different from the original. Last Assessment & Plan:  Formatting of this note might be different from the original.  Status post right hemicolectomy by Dr. Henreitta Leber today 7/12.    -Will check postop CBC and BMP     Urinary retention    Social History   Socioeconomic History   Marital status: Single    Spouse name: Not on file   Number of children: Not on file   Years of education: 12   Highest education level: 12th grade  Occupational History   Not on file  Tobacco Use   Smoking status: Former  Current packs/day: 0.00    Average packs/day: 0.3 packs/day for 40.0 years (10.0 ttl pk-yrs)    Types: Cigarettes    Start date: 07/02/1981    Quit date: 07/02/2021    Years since quitting: 1.8    Passive exposure: Past   Smokeless tobacco: Never   Tobacco comments:    Quit on 06/09/21  Vaping Use   Vaping status: Never Used  Substance and Sexual Activity   Alcohol use: Not Currently   Drug use: Never   Sexual activity: Not Currently  Other Topics Concern   Not on file  Social History Narrative   Not on file   Social Drivers of Health   Financial Resource Strain: Low Risk  (12/26/2022)   Overall Financial Resource Strain (CARDIA)    Difficulty of Paying Living Expenses: Not hard at all  Food Insecurity: No Food Insecurity (05/10/2023)   Hunger Vital Sign    Worried About Running Out of Food in the Last Year: Never true    Ran Out of Food in the Last Year: Never true  Transportation Needs: No Transportation Needs (05/10/2023)   PRAPARE - Administrator, Civil Service (Medical): No    Lack of Transportation (Non-Medical): No  Physical Activity:  Sufficiently Active (12/26/2022)   Exercise Vital Sign    Days of Exercise per Week: 5 days    Minutes of Exercise per Session: 50 min  Stress: No Stress Concern Present (12/26/2022)   [employer] of Occupational Health - Occupational Stress Questionnaire    Feeling of Stress : Only a little  Social Connections: Socially Isolated (05/10/2023)   Social Connection and Isolation Panel [NHANES]    Frequency of Communication with Friends and Family: More than three times a week    Frequency of Social Gatherings with Friends and Family: Once a week    Attends Religious Services: Never    Database administrator or Organizations: No    Attends Engineer, structural: Never    Marital Status: Divorced   Family History  Problem Relation Age of Onset   Stroke Father    Cirrhosis [name]    Colon cancer Neg Hx    Scheduled Meds:  apixaban  2.5 mg Oral BID   atorvastatin  40 mg Oral QHS   budeson-glycopyrrolate-formoterol  2 puff Inhalation BID   carvedilol  12.5 mg Oral BID WC   Chlorhexidine Gluconate Cloth  6 each Topical Daily   feeding supplement  237 mL Oral BID BM   furosemide  40 mg Intravenous Q12H   losartan  25 mg Oral Daily   nicotine  14 mg Transdermal Daily   Vericiguat  2.5 mg Oral Daily   Continuous Infusions: PRN Meds:.acetaminophen **OR** acetaminophen, albuterol, hydrALAZINE Medications Prior to Admission:  Prior to Admission medications   Medication Sig Start Date End Date Taking? Authorizing Provider  apixaban (ELIQUIS) 2.5 MG TABS tablet Take 1 tablet (2.5 mg total) by mouth 2 (two) times daily. 04/21/23  Yes Shah, Pratik D, DO  atorvastatin (LIPITOR) 40 MG tablet Take 1 tablet (40 mg total) by mouth every evening. Patient taking differently: Take 40 mg by mouth daily. 08/01/22 02/05/24 Yes Shahmehdi, Gemma Payor, MD  Budeson-Glycopyrrol-Formoterol (BREZTRI AEROSPHERE) 160-9-4.8 MCG/ACT AERO Inhale 2 puffs into the lungs 2 (two) times daily.   Yes [provider]  carvedilol (COREG) 6.25 MG tablet Take 1 tablet (6.25 mg total) by mouth 2 (two) times daily with a meal. 04/27/23  Yes Strader,  [place] M, PA-C  diltiazem (CARDIZEM) 30 MG tablet Take 30 mg by mouth daily as needed (heart rate >110 BPM).   Yes [provider]  nitroGLYCERIN (NITROSTAT) 0.4 MG SL tablet Place 1 tablet (0.4 mg total) under the tongue every 5 (five) minutes as needed for chest pain. 02/05/23  Yes Loetta Rough, MD  potassium chloride SA (KLOR-CON M) 20 MEQ tablet Take 1 tablet (20 mEq total) by mouth daily. 07/26/22 02/05/24 Yes Shahmehdi, Gemma Payor, MD  torsemide (DEMADEX) 20 MG tablet Take 20 mg by mouth 2 (two) times daily.   Yes [provider]  Vericiguat (VERQUVO) 2.5 MG TABS Take 1 tablet (2.5 mg total) by mouth daily. 04/27/23  Yes Strader, [place] M, PA-C   Allergies  Allergen Reactions   Entresto [Sacubitril-Valsartan] Other (See Comments)    Orthostatic Hypotension Per pt, he has no allergies   Sglt2 Inhibitors Other (See Comments)    UTI's Per pt, he has no allergies   Review of Systems  Unable to perform ROS: Other    Physical Exam Vitals and nursing note reviewed.     Vital Signs: BP (!) 181/96   Pulse 69   Temp 97.6 F (36.4 C) (Oral)   Resp 19   Ht 6' (1.829 m)   Wt 61.3 kg   SpO2 99%   BMI 18.33 kg/m  Pain Scale: 0-10   Pain Score: 0-No pain   SpO2: SpO2: 99 % O2 Device:SpO2: 99 % O2 Flow Rate: .   IO: Intake/output summary:  Intake/Output Summary (Last 24 hours) at 05/11/2023 1884 Last data filed at 05/11/2023 0500 Gross per 24 hour  Intake 200 ml  Output 1600 ml  Net -1400 ml    LBM: Last BM Date : 05/10/23 (as per patient) Baseline Weight: Weight: 64.4 kg Most recent weight: Weight: 61.3 kg     Palliative Assessment/Data:     Time In: 1130  Time Out: 1225 Time Total: 55 minutes  Greater than 50%  of this time was spent counseling and coordinating care related to the above assessment and  plan.  Signed by: Katheran Awe, NP   Please contact Palliative Medicine Team phone at [phone] for questions and concerns.  For individual provider: See Loretha Stapler

## 2023-05-11 NOTE — Hospital Course (Addendum)
 68 y.o. male, Michael [name] is a 68 y.o. male with medical history significant for systolic and diastolic CHF, atrial fibrillation, COPD, hypertension, CKD 3, BPH.  Patient with multiple admissions in the past for the same, as well with known history of cholelithiasis, high risk for surgery, patient presents to ED secondary to complaints of shortness of breath over the last few days, patient usually noncompliant with medication, but reports he has been compliant recently, unclear if he has been compliant with fluid restrictions, he denies chest pain, reports shortness of breath and dizziness. - In ED patient workup significant for elevated BNP> 4500, which is his baseline.  Creatinine at baseline of 1.74, troponins 86> 88, at baseline, chest x-ray with evidence of vascular congestion and cardiomegaly, blood pressure was significantly elevated with systolic in the 180s, patient received IV Lasix, [facility] hospitalist consulted to admit.

## 2023-05-11 NOTE — Progress Notes (Signed)
 PROGRESS NOTE   Michael [name]  WUJ:[id number] DOB: 1956-01-04 DOA: 05/10/2023 PCP: Benita Stabile, MD   Chief Complaint  Patient presents with   Shortness of Breath   Dizziness   Level of care: Stepdown  Brief Admission History:  68 y.o. male, Michael [name] is a 68 y.o. male with medical history significant for systolic and diastolic CHF, atrial fibrillation, COPD, hypertension, CKD 3, BPH.  Patient with multiple admissions in the past for the same, as well with known history of cholelithiasis, high risk for surgery, patient presents to ED secondary to complaints of shortness of breath over the last few days, patient usually noncompliant with medication, but reports he has been compliant recently, unclear if he has been compliant with fluid restrictions, he denies chest pain, reports shortness of breath and dizziness. - In ED patient workup significant for elevated BNP> 4500, which is his baseline.  Creatinine at baseline of 1.74, troponins 86> 88, at baseline, chest x-ray with evidence of vascular congestion and cardiomegaly, blood pressure was significantly elevated with systolic in the 180s, patient received IV Lasix, [facility] hospitalist consulted to admit.   Assessment and Plan:  Hypertensive emergency with acute heart failure - BPs slowly coming down with treatments  - following closely in stepdown ICU  Acute on chronic combined systolic and diastolic CHF -Patient with known history of low EF at 25%, poor compliance with fluid restriction and medications. -Volume overload on imaging, has +1 edema, and elevated BNP -Will continue with IV Lasix 40 mg IV twice daily. - Continue with Coreg dose was increased on admission to 12.5 mg BID, follow for tolerance. - Reviewing cardiology not on entresto due to orthostatic hypotension in the past, and not on SGLT 2 inhibitors due to frequent UTIs. - Continue with daily weights, strict ins and out. - Continue with home Verquovo    Paroxysmal atrial fibrillation Sinew with Eliquis for anticoagulation Continue with Coreg 12.5 mg p.o. twice daily as blood pressure is elevated (Home dose 6.25 mg p.o. twice daily   Chronic obstructive pulmonary disease  He is with no active wheezing, received several doses of IV solumedrol which was discontinued this morning   CKD stage 3b, GFR 30-44 ml/min  - Function appears at baseline, continue to monitor as on IV diuresis   Tobacco abuse -He was counseled, reports he cut down, started on nicotine patch   CAD -Troponins elevated, but non-ACS pattern, at baseline -Continue with Coreg, losartan, and statin, not on aspirin due to being on Eliquis   Prolonged QTc - EKG showing prolonged QTc 561, repeat showing QTc at 508, magnesium above 2, potassium is 3.8, will replace specially he is on IV Lasix, will monitor on telemetry   Goals of care discussion - Patient with frequent admissions, and ED visits, with significant subjective dyspnea, he is with COPD, still smoking, with significant cardiomyopathy with low EF, poor functional status, I have discussed with him cessation of palliative care medicine to address goals of care and symptom management, he is agreeable, so consult was placed  DVT prophylaxis: apixaban  Code Status: Full  Family Communication:  Disposition: TBD    Consultants:  Palliative medicine service   Procedures:   Antimicrobials:    Subjective: Pt says he is less SOB, denies headaches, denies CP, denies neuro changes.  Objective: Vitals:   05/11/23 1000 05/11/23 1030 05/11/23 1100 05/11/23 1117  BP: (!) 150/96 (!) 147/89 (!) 146/99   Pulse: 63 (!) 57 (!) 58  Resp: (!) 21 17 10    Temp:    (!) 97.5 F (36.4 C)  TempSrc:    Oral  SpO2: 97% 99% 99%   Weight:      Height:        Intake/Output Summary (Last 24 hours) at 05/11/2023 1130 Last data filed at 05/11/2023 0950 Gross per 24 hour  Intake 200 ml  Output 2200 ml  Net -2000 ml   Filed  Weights   05/10/23 1523 05/10/23 2058  Weight: 64.4 kg 61.3 kg   Examination:  General exam: Appears calm and comfortable  Respiratory system: Clear to auscultation. Respiratory effort normal. Cardiovascular system: normal S1 & S2 heard. No JVD, murmurs, rubs, gallops or clicks. No pedal edema. Gastrointestinal system: Abdomen is nondistended, soft and nontender. No organomegaly or masses felt. Normal bowel sounds heard. Central nervous system: Alert and oriented. No focal neurological deficits. Extremities: Symmetric 5 x 5 power. Skin: No rashes, lesions or ulcers. Psychiatry: Judgement and insight appear normal. Mood & affect appropriate.   Data Reviewed: I have personally reviewed following labs and imaging studies  CBC: Recent Labs  Lab 05/10/23 1659 05/11/23 0438  WBC 7.8 3.1*  NEUTROABS 5.2  --   HGB 12.6* 12.0*  HCT 40.4 38.4*  MCV 88.4 88.7  PLT 223 206    Basic Metabolic Panel: Recent Labs  Lab 05/10/23 1539 05/11/23 0438  NA 138 139  K 3.8 4.2  CL 104 107  CO2 23 24  GLUCOSE 133* 280*  BUN 30* 34*  CREATININE 1.74* 1.74*  CALCIUM 9.3 8.9  MG 2.2  --     CBG: No results for input(s): "GLUCAP" in the last 168 hours.  No results found for this or any previous visit (from the past 240 hours).   Radiology Studies: DG Chest Port 1 View Result Date: 05/10/2023 CLINICAL DATA:  Shortness of breath. EXAM: PORTABLE CHEST 1 VIEW COMPARISON:  Chest radiograph dated 04/26/2023. FINDINGS: Cardiomegaly with vascular congestion. No focal consolidation, pleural effusion or pneumothorax. Atherosclerotic calcification of the aortic arch. No acute osseous pathology. IMPRESSION: Cardiomegaly with vascular congestion. Electronically Signed   By: Elgie Collard M.D.   On: 05/10/2023 17:35    Scheduled Meds:  apixaban  2.5 mg Oral BID   atorvastatin  40 mg Oral QHS   budeson-glycopyrrolate-formoterol  2 puff Inhalation BID   carvedilol  12.5 mg Oral BID WC    Chlorhexidine Gluconate Cloth  6 each Topical Daily   feeding supplement  237 mL Oral BID BM   furosemide  40 mg Intravenous Q12H   losartan  25 mg Oral Daily   nicotine  14 mg Transdermal Daily   Vericiguat  2.5 mg Oral Daily   Continuous Infusions:   LOS: 1 day   Time spent: 58 mins  Abdulaziz Toman Laural Benes, MD How to contact the [facility] Attending or Consulting provider 7A - 7P or covering provider during after hours 7P -7A, for this patient?  Check the care team in [facility] and look for a) attending/consulting TR[facility] provider listed and b) the [facility] team listed Log into [email or web address] to find provider on call.  Locate the [facility] provider you are looking for under [facility] and page to a number that you can be directly reached. If you still have difficulty reaching the provider, please page the [facility] (Director on Call) for the Hospitalists listed on amion for assistance.  05/11/2023, 11:30 AM

## 2023-05-11 NOTE — Plan of Care (Signed)
 Discussed with patient plan of care for the evening, pain management and fluids with some teach back displayed.    Problem: Education: Goal: Knowledge of General Education information will improve Description: Including pain rating scale, medication(s)/side effects and non-pharmacologic comfort measures Outcome: Progressing   Problem: Health Behavior/Discharge Planning: Goal: Ability to manage health-related needs will improve Outcome: Progressing

## 2023-05-12 DIAGNOSIS — D638 Anemia in other chronic diseases classified elsewhere: Secondary | ICD-10-CM | POA: Diagnosis not present

## 2023-05-12 DIAGNOSIS — Z91199 Patient's noncompliance with other medical treatment and regimen due to unspecified reason: Secondary | ICD-10-CM | POA: Diagnosis not present

## 2023-05-12 DIAGNOSIS — I2510 Atherosclerotic heart disease of native coronary artery without angina pectoris: Secondary | ICD-10-CM | POA: Diagnosis not present

## 2023-05-12 DIAGNOSIS — J449 Chronic obstructive pulmonary disease, unspecified: Secondary | ICD-10-CM | POA: Diagnosis not present

## 2023-05-12 LAB — BASIC METABOLIC PANEL WITH GFR
Anion gap: 7 (ref 5–15)
BUN: 37 mg/dL — ABNORMAL HIGH (ref 8–23)
CO2: 24 mmol/L (ref 22–32)
Calcium: 8.6 mg/dL — ABNORMAL LOW (ref 8.9–10.3)
Chloride: 107 mmol/L (ref 98–111)
Creatinine, Ser: 1.63 mg/dL — ABNORMAL HIGH (ref 0.61–1.24)
GFR, Estimated: 46 mL/min — ABNORMAL LOW (ref >60)
Glucose, Bld: 178 mg/dL — ABNORMAL HIGH (ref 70–99)
Potassium: 3.2 mmol/L — ABNORMAL LOW (ref 3.5–5.1)
Sodium: 138 mmol/L (ref 135–145)

## 2023-05-12 LAB — MAGNESIUM: Magnesium: 2.2 mg/dL (ref 1.7–2.4)

## 2023-05-12 MED ORDER — POTASSIUM CHLORIDE CRYS ER 20 MEQ PO TBCR
40.0000 meq | EXTENDED_RELEASE_TABLET | Freq: Every day | ORAL | Status: DC
  Administered 2023-05-12: 40 meq via ORAL
  Filled 2023-05-12: qty 2

## 2023-05-12 MED ORDER — HYDRALAZINE HCL 10 MG PO TABS
10.0000 mg | ORAL_TABLET | Freq: Three times a day (TID) | ORAL | Status: DC
  Administered 2023-05-12 – 2023-05-13 (×4): 10 mg via ORAL
  Filled 2023-05-12 (×4): qty 1

## 2023-05-12 NOTE — Progress Notes (Signed)
 PROGRESS NOTE   Michael [name]  GNF:[id number] DOB: Feb 09, 1955 DOA: 05/10/2023 PCP: Benita Stabile, MD   Chief Complaint  Patient presents with   Shortness of Breath   Dizziness   Level of care: Telemetry  Brief Admission History:  68 y.o. male, Michael [name] is a 68 y.o. male with medical history significant for systolic and diastolic CHF, atrial fibrillation, COPD, hypertension, CKD 3, BPH.  Patient with multiple admissions in the past for the same, as well with known history of cholelithiasis, high risk for surgery, patient presents to ED secondary to complaints of shortness of breath over the last few days, patient usually noncompliant with medication, but reports he has been compliant recently, unclear if he has been compliant with fluid restrictions, he denies chest pain, reports shortness of breath and dizziness. - In ED patient workup significant for elevated BNP> 4500, which is his baseline.  Creatinine at baseline of 1.74, troponins 86> 88, at baseline, chest x-ray with evidence of vascular congestion and cardiomegaly, blood pressure was significantly elevated with systolic in the 180s, patient received IV Lasix, [facility] hospitalist consulted to admit.   Assessment and Plan:  Hypertensive emergency with acute heart failure - BPs slowly coming down with treatments  - following closely in stepdown ICU - added hydralazine 10 mg TID for better BP control   Acute on chronic combined systolic and diastolic CHF -Patient with known history of low EF at 25%, poor compliance with fluid restriction and medications. -Volume overload on imaging, has +1 edema, and elevated BNP -Continue with IV Lasix 40 mg IV twice daily. -Continue with Coreg dose was increased on admission to 12.5 mg BID, follow for tolerance. -Reviewing cardiology not on entresto due to orthostatic hypotension in the past, and not on SGLT 2 inhibitors due to frequent UTIs. - Continue with daily weights, strict ins  and out. - Continue with home Verquovo   Paroxysmal atrial fibrillation Sinew with Eliquis for anticoagulation Continue with Coreg 12.5 mg p.o. twice daily as blood pressure is elevated (Home dose 6.25 mg p.o. twice daily   Chronic obstructive pulmonary disease  He is with no active wheezing, received several doses of IV solumedrol which was discontinued this morning   CKD stage 3b, GFR 30-44 ml/min  - Function appears at baseline, continue to monitor as on IV diuresis   Tobacco abuse -He was counseled, reports he cut down, started on nicotine patch   CAD -Troponins elevated, but non-ACS pattern, at baseline -Continue with Coreg, losartan, and statin, not on aspirin due to being on Eliquis   Prolonged QTc - EKG showing prolonged QTc 561, repeat showing QTc at 508, magnesium above 2, potassium is 3.8, will replace specially he is on IV Lasix, will monitor on telemetry   Goals of care discussion - Patient with frequent admissions, and ED visits, with significant subjective dyspnea, he is with COPD, still smoking, with significant cardiomyopathy with low EF, poor functional status, I have discussed with him cessation of palliative care medicine to address goals of care and symptom management, he is agreeable, so consult was placed  DVT prophylaxis: apixaban  Code Status: Full  Family Communication:  Disposition: TBD    Consultants:  Palliative medicine service   Procedures:   Antimicrobials:    Subjective: Pt denies chest pain, headache, but his SOB persists and does not feel back to baseline.   Objective: Vitals:   05/12/23 0700 05/12/23 0800 05/12/23 0813 05/12/23 1158  BP: 123/64 (!) 126/57  Pulse: 63 73    Resp: 15 (!) 21    Temp:   97.6 F (36.4 C) 97.6 F (36.4 C)  TempSrc:   Oral Oral  SpO2: 96% 98%    Weight:      Height:        Intake/Output Summary (Last 24 hours) at 05/12/2023 1227 Last data filed at 05/12/2023 1215 Gross per 24 hour  Intake 1160 ml   Output 2075 ml  Net -915 ml   Filed Weights   05/10/23 1523 05/10/23 2058  Weight: 64.4 kg 61.3 kg   Examination:  General exam: Appears calm and comfortable  Respiratory system: Clear to auscultation. Respiratory effort normal. Cardiovascular system: normal S1 & S2 heard. No JVD, murmurs, rubs, gallops or clicks. 1+ pedal edema. Gastrointestinal system: Abdomen is nondistended, soft and nontender. No organomegaly or masses felt. Normal bowel sounds heard. Central nervous system: Alert and oriented. No focal neurological deficits. Extremities: 1+ pretibial edema BLEs, Symmetric 5 x 5 power. Skin: No rashes, lesions or ulcers. Psychiatry: Judgement and insight appear normal. Mood & affect appropriate.   Data Reviewed: I have personally reviewed following labs and imaging studies  CBC: Recent Labs  Lab 05/10/23 1659 05/11/23 0438  WBC 7.8 3.1*  NEUTROABS 5.2  --   HGB 12.6* 12.0*  HCT 40.4 38.4*  MCV 88.4 88.7  PLT 223 206    Basic Metabolic Panel: Recent Labs  Lab 05/10/23 1539 05/11/23 0438 05/12/23 0447  NA 138 139 138  K 3.8 4.2 3.2*  CL 104 107 107  CO2 23 24 24   GLUCOSE 133* 280* 178*  BUN 30* 34* 37*  CREATININE 1.74* 1.74* 1.63*  CALCIUM 9.3 8.9 8.6*  MG 2.2  --  2.2    CBG: No results for input(s): "GLUCAP" in the last 168 hours.  Recent Results (from the past 240 hours)  MRSA Next Gen by PCR, Nasal     Status: None   Collection Time: 05/09/23  9:17 PM   Specimen: Anterior Nasal Swab  Result Value Ref Range Status   MRSA by PCR Next Gen NOT DETECTED NOT DETECTED Final    Comment: (NOTE) The GeneXpert MRSA Assay (FDA approved for NASAL specimens only), is one component of a comprehensive MRSA colonization surveillance program. It is not intended to diagnose MRSA infection nor to guide or monitor treatment for MRSA infections. Test performance is not FDA approved in patients less than 78 years old. Performed at [facility], [address],  [place], [place] [address]   Resp panel by RT-PCR (RSV, Flu A&B, Covid) Anterior Nasal Swab     Status: None   Collection Time: 05/10/23  9:17 PM   Specimen: Anterior Nasal Swab  Result Value Ref Range Status   SARS Coronavirus 2 by RT PCR NEGATIVE NEGATIVE Final    Comment: (NOTE) SARS-CoV-2 target nucleic acids are NOT DETECTED.  The SARS-CoV-2 RNA is generally detectable in upper respiratory specimens during the acute phase of infection. The lowest concentration of SARS-CoV-2 viral copies this assay can detect is 138 copies/mL. A negative result does not preclude SARS-Cov-2 infection and should not be used as the sole basis for treatment or other patient management decisions. A negative result may occur with  improper specimen collection/handling, submission of specimen other than nasopharyngeal swab, presence of viral mutation(s) within the areas targeted by this assay, and inadequate number of viral copies(<138 copies/mL). A negative result must be combined with clinical observations, patient history, and epidemiological information. The  expected result is Negative.  Fact Sheet for Patients:  BloggerCourse.com  Fact Sheet for Healthcare Providers:  SeriousBroker.it  This test is no t yet approved or cleared by the [place] FDA and  has been authorized for detection and/or diagnosis of SARS-CoV-2 by FDA under an Emergency Use Authorization (EUA). This EUA will remain  in effect (meaning this test can be used) for the duration of the COVID-19 declaration under Section 564(b)(1) of the Act, 21 U.S.C.section 360bbb-3(b)(1), unless the authorization is terminated  or revoked sooner.       Influenza A by PCR NEGATIVE NEGATIVE Final   Influenza B by PCR NEGATIVE NEGATIVE Final    Comment: (NOTE) The Xpert Xpress SARS-CoV-2/FLU/RSV plus assay is intended as an aid in the diagnosis of influenza from Nasopharyngeal swab specimens  and should not be used as a sole basis for treatment. Nasal washings and aspirates are unacceptable for Xpert Xpress SARS-CoV-2/FLU/RSV testing.  Fact Sheet for Patients: BloggerCourse.com  Fact Sheet for Healthcare Providers: SeriousBroker.it  This test is not yet approved or cleared by the [place] FDA and has been authorized for detection and/or diagnosis of SARS-CoV-2 by FDA under an Emergency Use Authorization (EUA). This EUA will remain in effect (meaning this test can be used) for the duration of the COVID-19 declaration under Section 564(b)(1) of the Act, 21 U.S.C. section 360bbb-3(b)(1), unless the authorization is terminated or revoked.     Resp Syncytial Virus by PCR NEGATIVE NEGATIVE Final    Comment: (NOTE) Fact Sheet for Patients: BloggerCourse.com  Fact Sheet for Healthcare Providers: SeriousBroker.it  This test is not yet approved or cleared by the [place] FDA and has been authorized for detection and/or diagnosis of SARS-CoV-2 by FDA under an Emergency Use Authorization (EUA). This EUA will remain in effect (meaning this test can be used) for the duration of the COVID-19 declaration under Section 564(b)(1) of the Act, 21 U.S.C. section 360bbb-3(b)(1), unless the authorization is terminated or revoked.  Performed at [facility], [address] [place], [place] [address]      Radiology Studies: [facility] Chest [facility] 1 View Result Date: 05/10/2023 CLINICAL DATA:  Shortness of breath. EXAM: PORTABLE CHEST 1 VIEW COMPARISON:  Chest radiograph dated 04/26/2023. FINDINGS: Cardiomegaly with vascular congestion. No focal consolidation, pleural effusion or pneumothorax. Atherosclerotic calcification of the aortic arch. No acute osseous pathology. IMPRESSION: Cardiomegaly with vascular congestion. Electronically Signed   By: Elgie Collard M.D.   On: 05/10/2023 17:35     Scheduled Meds:  apixaban  2.5 mg Oral BID   atorvastatin  40 mg Oral QHS   budeson-glycopyrrolate-formoterol  2 puff Inhalation BID   carvedilol  12.5 mg Oral BID WC   Chlorhexidine Gluconate Cloth  6 each Topical Daily   feeding supplement  237 mL Oral BID BM   furosemide  40 mg Intravenous Q12H   hydrALAZINE  10 mg Oral Q8H   losartan  25 mg Oral Daily   nicotine  14 mg Transdermal Daily   potassium chloride  40 mEq Oral Daily   Vericiguat  2.5 mg Oral Daily   Continuous Infusions:   LOS: 2 days   Time spent: 55 mins  Jyaire Koudelka Laural Benes, MD How to contact the [facility] Attending or Consulting provider 7A - 7P or covering provider during after hours 7P -7A, for this patient?  Check the care team in [facility] and look for a) attending/consulting TRH provider listed and b) the [facility] team listed Log into [email or web address] to find provider  on call.  Locate the [facility] provider you are looking for under [facility] and page to a number that you can be directly reached. If you still have difficulty reaching the provider, please page the [facility] (Director on Call) for the Hospitalists listed on amion for assistance.  05/12/2023, 12:27 PM

## 2023-05-12 NOTE — Plan of Care (Signed)
  Problem: Education: Goal: Knowledge of General Education information will improve Description: Including pain rating scale, medication(s)/side effects and non-pharmacologic comfort measures Outcome: Progressing   Problem: Clinical Measurements: Goal: Will remain free from infection Outcome: Progressing   Problem: Clinical Measurements: Goal: Diagnostic test results will improve Outcome: Progressing   Problem: Clinical Measurements: Goal: Cardiovascular complication will be avoided Outcome: Progressing   Problem: Activity: Goal: Risk for activity intolerance will decrease Outcome: Progressing

## 2023-05-13 DIAGNOSIS — D638 Anemia in other chronic diseases classified elsewhere: Secondary | ICD-10-CM | POA: Diagnosis not present

## 2023-05-13 DIAGNOSIS — J449 Chronic obstructive pulmonary disease, unspecified: Secondary | ICD-10-CM | POA: Diagnosis not present

## 2023-05-13 DIAGNOSIS — I2510 Atherosclerotic heart disease of native coronary artery without angina pectoris: Secondary | ICD-10-CM | POA: Diagnosis not present

## 2023-05-13 DIAGNOSIS — Z91199 Patient's noncompliance with other medical treatment and regimen due to unspecified reason: Secondary | ICD-10-CM | POA: Diagnosis not present

## 2023-05-13 LAB — BASIC METABOLIC PANEL WITH GFR
Anion gap: 11 (ref 5–15)
BUN: 46 mg/dL — ABNORMAL HIGH (ref 8–23)
CO2: 26 mmol/L (ref 22–32)
Calcium: 8.6 mg/dL — ABNORMAL LOW (ref 8.9–10.3)
Chloride: 105 mmol/L (ref 98–111)
Creatinine, Ser: 1.72 mg/dL — ABNORMAL HIGH (ref 0.61–1.24)
GFR, Estimated: 43 mL/min — ABNORMAL LOW (ref >60)
Glucose, Bld: 114 mg/dL — ABNORMAL HIGH (ref 70–99)
Potassium: 3.3 mmol/L — ABNORMAL LOW (ref 3.5–5.1)
Sodium: 142 mmol/L (ref 135–145)

## 2023-05-13 LAB — MAGNESIUM: Magnesium: 2 mg/dL (ref 1.7–2.4)

## 2023-05-13 LAB — PROTIME-INR
INR: 1.3 — ABNORMAL HIGH (ref 0.8–1.2)
Prothrombin Time: 16.2 s — ABNORMAL HIGH (ref 11.4–15.2)

## 2023-05-13 LAB — TROPONIN I (HIGH SENSITIVITY)
Troponin I (High Sensitivity): 56 ng/L — ABNORMAL HIGH (ref <18)
Troponin I (High Sensitivity): 57 ng/L — ABNORMAL HIGH (ref <18)

## 2023-05-13 LAB — HEPATIC FUNCTION PANEL
ALT: 38 U/L (ref 0–44)
AST: 32 U/L (ref 15–41)
Albumin: 3 g/dL — ABNORMAL LOW (ref 3.5–5.0)
Alkaline Phosphatase: 184 U/L — ABNORMAL HIGH (ref 38–126)
Bilirubin, Direct: 0.2 mg/dL (ref 0.0–0.2)
Indirect Bilirubin: 0.5 mg/dL (ref 0.3–0.9)
Total Bilirubin: 0.7 mg/dL (ref 0.0–1.2)
Total Protein: 6.5 g/dL (ref 6.5–8.1)

## 2023-05-13 LAB — APTT: aPTT: 33 s (ref 24–36)

## 2023-05-13 MED ORDER — POTASSIUM CHLORIDE CRYS ER 20 MEQ PO TBCR
40.0000 meq | EXTENDED_RELEASE_TABLET | Freq: Once | ORAL | Status: AC
Start: 2023-05-13 — End: 2023-05-13
  Administered 2023-05-13: 40 meq via ORAL
  Filled 2023-05-13: qty 2

## 2023-05-13 MED ORDER — HYDRALAZINE HCL 25 MG PO TABS
25.0000 mg | ORAL_TABLET | Freq: Three times a day (TID) | ORAL | Status: DC
  Administered 2023-05-13 – 2023-05-17 (×9): 25 mg via ORAL
  Filled 2023-05-13 (×11): qty 1

## 2023-05-13 MED ORDER — FUROSEMIDE 10 MG/ML IJ SOLN
40.0000 mg | Freq: Two times a day (BID) | INTRAMUSCULAR | Status: DC
  Administered 2023-05-14: 40 mg via INTRAVENOUS
  Filled 2023-05-13: qty 4

## 2023-05-13 MED ORDER — POTASSIUM CHLORIDE 10 MEQ/100ML IV SOLN
10.0000 meq | INTRAVENOUS | Status: AC
  Administered 2023-05-13 (×2): 10 meq via INTRAVENOUS
  Filled 2023-05-13: qty 100

## 2023-05-13 MED ORDER — NITROGLYCERIN 0.4 MG SL SUBL
0.4000 mg | SUBLINGUAL_TABLET | SUBLINGUAL | Status: DC | PRN
  Administered 2023-05-13: 0.4 mg via SUBLINGUAL
  Filled 2023-05-13: qty 1

## 2023-05-13 NOTE — Progress Notes (Signed)
   05/13/23 0142  Vitals  Temp 97.9 F (36.6 C)  Temp Source Oral  BP 113/61  MAP (mmHg) 77  BP Location Right Arm  BP Method Automatic  Patient Position (if appropriate) Lying  Pulse Rate (!) 57  Pulse Rate Source Monitor  Resp 20  MEWS COLOR  MEWS Score Color Green  Oxygen Therapy  SpO2 99 %  MEWS Score  MEWS Temp 0  MEWS Systolic 0  MEWS Pulse 0  MEWS RR 0  MEWS LOC 0  MEWS Score 0

## 2023-05-13 NOTE — Progress Notes (Signed)
 Re: Chest pain. Stat EKG -st depression worse than earlier ekgs. TNI ordered. NTG x 1 Sl relieved left sided chest pain.  Echo shows HFrEF with low EF of 20s. Suspect patient is having chest pain related to demand ischemia. Patient transferred to the ICU for suspected developing NSTEMI and low threshold of antiplatelet therapy and amiodarone.  Additional recommendations per virtual ICU MD.. Blood pressure systolic 86 at bedside due to nitro.  Patient is asymptomatic and chest pain is 3 out of 10. Discussed with nurse to transfer patient to ICU stat.

## 2023-05-13 NOTE — Progress Notes (Signed)
 PROGRESS NOTE   Mccrae Speciale  WUJ:[id number] DOB: Aug 29, 1955 DOA: 05/10/2023 PCP: [name], MD   Chief Complaint  Patient presents with   Shortness of Breath   Dizziness   Level of care: ICU  Brief Admission History:  68 y.o. male, Michael [name] is a 68 y.o. male with medical history significant for systolic and diastolic CHF, atrial fibrillation, COPD, hypertension, CKD 3, BPH.  Patient with multiple admissions in the past for the same, as well with known history of cholelithiasis, high risk for surgery, patient presents to ED secondary to complaints of shortness of breath over the last few days, patient usually noncompliant with medication, but reports he has been compliant recently, unclear if he has been compliant with fluid restrictions, he denies chest pain, reports shortness of breath and dizziness. - In ED patient workup significant for elevated BNP> 4500, which is his baseline.  Creatinine at baseline of 1.74, troponins 86> 88, at baseline, chest x-ray with evidence of vascular congestion and cardiomegaly, blood pressure was significantly elevated with systolic in the 180s, patient received IV Lasix, [facility] hospitalist consulted to admit.   Assessment and Plan:  Hypertensive emergency with acute heart failure - BPs slowly coming down with treatments  - following closely in stepdown ICU - increased hydralazine 25 mg TID for better BP control   Acute on chronic combined systolic and diastolic CHF -Patient with known history of low EF at 25%, poor compliance with fluid restriction and medications. -Volume overload on imaging, has +1 edema, and elevated BNP -Continue with IV Lasix 40 mg IV twice daily. -Continue with Coreg dose was increased on admission to 12.5 mg BID, follow for tolerance. -Reviewing cardiology not on entresto due to orthostatic hypotension in the past, and not on SGLT 2 inhibitors due to frequent UTIs. - Continue with daily weights, strict ins and  out. - Continue with home Verquovo   Paroxysmal atrial fibrillation Sinew with Eliquis for anticoagulation Continue with Coreg 12.5 mg p.o. twice daily as blood pressure is elevated (Home dose 6.25 mg p.o. twice daily   Chest Pain - resolved now - HS troponin tests reassuring - NTG pRN - continue heart failure treatment   Chronic obstructive pulmonary disease  He is with no active wheezing, received several doses of IV solumedrol which was discontinued this morning   CKD stage 3b, GFR 30-44 ml/min  - Function appears at baseline, continue to monitor as on IV diuresis   Tobacco abuse -He was counseled, reports he cut down, started on nicotine patch   CAD -Troponins elevated, but non-ACS pattern, at baseline -Continue with Coreg, losartan, and statin, not on aspirin due to being on Eliquis   Prolonged QTc - EKG showing prolonged QTc 561, repeat showing QTc at 508, magnesium above 2, potassium is 3.8, will replace specially he is on IV Lasix, will monitor on telemetry   Goals of care discussion - Patient with frequent admissions, and ED visits, with significant subjective dyspnea, he is with COPD, still smoking, with significant cardiomyopathy with low EF, poor functional status, I have discussed with him cessation of palliative care medicine to address goals of care and symptom management, he is agreeable, so consult was placed  DVT prophylaxis: apixaban  Code Status: Full  Family Communication:  Disposition: TBD    Consultants:  Palliative medicine service   Procedures:   Antimicrobials:    Subjective: Pt had an episode of CP overnight, improved after NTG, feeling better this morning.  Objective: Vitals:   05/13/23 1100 05/13/23 1146 05/13/23 1200 05/13/23 1300  BP: (!) 123/54  126/72 124/64  Pulse: 60  67 63  Resp: 15  19 (!) 26  Temp:  97.7 F (36.5 C)    TempSrc:  Axillary    SpO2: 100%  99% 99%  Weight:      Height:        Intake/Output Summary (Last 24  hours) at 05/13/2023 1427 Last data filed at 05/13/2023 1300 Gross per 24 hour  Intake 1342.68 ml  Output 750 ml  Net 592.68 ml   Filed Weights   05/10/23 1523 05/10/23 2058 05/13/23 0248  Weight: 64.4 kg 61.3 kg 61.6 kg   Examination:  General exam: Appears calm and comfortable  Respiratory system: Clear to auscultation. Respiratory effort normal. Cardiovascular system: normal S1 & S2 heard. No JVD, murmurs, rubs, gallops or clicks. 1+ pedal edema. Gastrointestinal system: Abdomen is nondistended, soft and nontender. No organomegaly or masses felt. Normal bowel sounds heard. Central nervous system: Alert and oriented. No focal neurological deficits. Extremities: 1+ pretibial edema BLEs, Symmetric 5 x 5 power. Skin: No rashes, lesions or ulcers. Psychiatry: Judgement and insight appear normal. Mood & affect appropriate.   Data Reviewed: I have personally reviewed following labs and imaging studies  CBC: Recent Labs  Lab 05/10/23 1659 05/11/23 0438  WBC 7.8 3.1*  NEUTROABS 5.2  --   HGB 12.6* 12.0*  HCT 40.4 38.4*  MCV 88.4 88.7  PLT 223 206    Basic Metabolic Panel: Recent Labs  Lab 05/10/23 1539 05/11/23 0438 05/12/23 0447 05/13/23 0254  NA 138 139 138 142  K 3.8 4.2 3.2* 3.3*  CL 104 107 107 105  CO2 23 24 24 26   GLUCOSE 133* 280* 178* 114*  BUN 30* 34* 37* 46*  CREATININE 1.74* 1.74* 1.63* 1.72*  CALCIUM 9.3 8.9 8.6* 8.6*  MG 2.2  --  2.2 2.0    CBG: No results for input(s): "GLUCAP" in the last 168 hours.  Recent Results (from the past 240 hours)  MRSA Next Gen by PCR, Nasal     Status: None   Collection Time: 05/09/23  9:17 PM   Specimen: Anterior Nasal Swab  Result Value Ref Range Status   MRSA by PCR Next Gen NOT DETECTED NOT DETECTED Final    Comment: (NOTE) The GeneXpert MRSA Assay (FDA approved for NASAL specimens only), is one component of a comprehensive MRSA colonization surveillance program. It is not intended to diagnose MRSA infection  nor to guide or monitor treatment for MRSA infections. Test performance is not FDA approved in patients less than 34 years old. Performed at [facility], [address] [place], [place] [address]   Resp panel by RT-PCR (RSV, Flu A&B, Covid) Anterior Nasal Swab     Status: None   Collection Time: 05/10/23  9:17 PM   Specimen: Anterior Nasal Swab  Result Value Ref Range Status   SARS Coronavirus 2 by RT PCR NEGATIVE NEGATIVE Final    Comment: (NOTE) SARS-CoV-2 target nucleic acids are NOT DETECTED.  The SARS-CoV-2 RNA is generally detectable in upper respiratory specimens during the acute phase of infection. The lowest concentration of SARS-CoV-2 viral copies this assay can detect is 138 copies/mL. A negative result does not preclude SARS-Cov-2 infection and should not be used as the sole basis for treatment or other patient management decisions. A negative result may occur with  improper specimen collection/handling, submission of specimen other than nasopharyngeal swab, presence  of viral mutation(s) within the areas targeted by this assay, and inadequate number of viral copies(<138 copies/mL). A negative result must be combined with clinical observations, patient history, and epidemiological information. The expected result is Negative.  Fact Sheet for Patients:  BloggerCourse.com  Fact Sheet for Healthcare Providers:  SeriousBroker.it  This test is no t yet approved or cleared by the [place]  FDA and  has been authorized for detection and/or diagnosis of SARS-CoV-2 by FDA under an Emergency Use Authorization (EUA). This EUA will remain  in effect (meaning this test can be used) for the duration of the COVID-19 declaration under Section 564(b)(1) of the Act, 21 U.S.C.section 360bbb-3(b)(1), unless the authorization is terminated  or revoked sooner.       Influenza A by PCR NEGATIVE NEGATIVE Final   Influenza B by PCR  NEGATIVE NEGATIVE Final    Comment: (NOTE) The Xpert Xpress SARS-CoV-2/FLU/RSV plus assay is intended as an aid in the diagnosis of influenza from Nasopharyngeal swab specimens and should not be used as a sole basis for treatment. Nasal washings and aspirates are unacceptable for Xpert Xpress SARS-CoV-2/FLU/RSV testing.  Fact Sheet for Patients: BloggerCourse.com  Fact Sheet for Healthcare Providers: SeriousBroker.it  This test is not yet approved or cleared by the [place]  FDA and has been authorized for detection and/or diagnosis of SARS-CoV-2 by FDA under an Emergency Use Authorization (EUA). This EUA will remain in effect (meaning this test can be used) for the duration of the COVID-19 declaration under Section 564(b)(1) of the Act, 21 U.S.C. section 360bbb-3(b)(1), unless the authorization is terminated or revoked.     Resp Syncytial Virus by PCR NEGATIVE NEGATIVE Final    Comment: (NOTE) Fact Sheet for Patients: BloggerCourse.com  Fact Sheet for Healthcare Providers: SeriousBroker.it  This test is not yet approved or cleared by the [place]  FDA and has been authorized for detection and/or diagnosis of SARS-CoV-2 by FDA under an Emergency Use Authorization (EUA). This EUA will remain in effect (meaning this test can be used) for the duration of the COVID-19 declaration under Section 564(b)(1) of the Act, 21 U.S.C. section 360bbb-3(b)(1), unless the authorization is terminated or revoked.  Performed at [facility], [address] [place], [place] [address]      Radiology Studies: No results found.   Scheduled Meds:  apixaban  2.5 mg Oral BID   atorvastatin  40 mg Oral QHS   budeson-glycopyrrolate-formoterol  2 puff Inhalation BID   carvedilol  12.5 mg Oral BID WC   Chlorhexidine Gluconate Cloth  6 each Topical Daily   feeding supplement  237 mL Oral BID  BM   [START ON 05/14/2023] furosemide  40 mg Intravenous Q12H   hydrALAZINE  25 mg Oral Q8H   losartan  25 mg Oral Daily   nicotine  14 mg Transdermal Daily   Vericiguat  2.5 mg Oral Daily   Continuous Infusions:   LOS: 3 days   Time spent: 55 mins  Raechelle Sarti Lincoln Renshaw, MD How to contact the [facility] Attending or Consulting provider 7A - 7P or covering provider during after hours 7P -7A, for this patient?  Check the care team in [facility] and look for a) attending/consulting TR[facility] provider listed and b) the TRH team listed Log into [email or web address] to find provider on call.  Locate the [facility] provider you are looking for under [facility] and page to a number that you can be directly reached. If you still have difficulty reaching the provider, please page the [facility] (Director  on Call) for the Hospitalists listed on amion for assistance.  05/13/2023, 2:27 PM

## 2023-05-14 ENCOUNTER — Inpatient Hospital Stay (HOSPITAL_COMMUNITY)

## 2023-05-14 DIAGNOSIS — Z91199 Patient's noncompliance with other medical treatment and regimen due to unspecified reason: Secondary | ICD-10-CM | POA: Diagnosis not present

## 2023-05-14 DIAGNOSIS — J449 Chronic obstructive pulmonary disease, unspecified: Secondary | ICD-10-CM | POA: Diagnosis not present

## 2023-05-14 DIAGNOSIS — I2510 Atherosclerotic heart disease of native coronary artery without angina pectoris: Secondary | ICD-10-CM | POA: Diagnosis not present

## 2023-05-14 DIAGNOSIS — D638 Anemia in other chronic diseases classified elsewhere: Secondary | ICD-10-CM | POA: Diagnosis not present

## 2023-05-14 LAB — BASIC METABOLIC PANEL WITH GFR
Anion gap: 7 (ref 5–15)
BUN: 42 mg/dL — ABNORMAL HIGH (ref 8–23)
CO2: 27 mmol/L (ref 22–32)
Calcium: 8.5 mg/dL — ABNORMAL LOW (ref 8.9–10.3)
Chloride: 105 mmol/L (ref 98–111)
Creatinine, Ser: 1.55 mg/dL — ABNORMAL HIGH (ref 0.61–1.24)
GFR, Estimated: 48 mL/min — ABNORMAL LOW (ref >60)
Glucose, Bld: 129 mg/dL — ABNORMAL HIGH (ref 70–99)
Potassium: 3.5 mmol/L (ref 3.5–5.1)
Sodium: 139 mmol/L (ref 135–145)

## 2023-05-14 MED ORDER — TORSEMIDE 20 MG PO TABS
20.0000 mg | ORAL_TABLET | Freq: Two times a day (BID) | ORAL | Status: DC
  Administered 2023-05-15 – 2023-05-17 (×5): 20 mg via ORAL
  Filled 2023-05-14 (×5): qty 1

## 2023-05-14 MED ORDER — POTASSIUM CHLORIDE CRYS ER 20 MEQ PO TBCR
40.0000 meq | EXTENDED_RELEASE_TABLET | Freq: Every day | ORAL | Status: DC
  Administered 2023-05-14 – 2023-05-15 (×2): 40 meq via ORAL
  Filled 2023-05-14 (×2): qty 2

## 2023-05-14 NOTE — Progress Notes (Signed)
 PROGRESS NOTE   Michael [name]  RUE:[id number] DOB: 09-Apr-1955 DOA: 05/10/2023 PCP: [name], MD   Chief Complaint  Patient presents with   Shortness of Breath   Dizziness   Level of care: Telemetry  Brief Admission History:  68 y.o. male, Michael [name] is a 68 y.o. male with medical history significant for systolic and diastolic CHF, atrial fibrillation, COPD, hypertension, CKD 3, BPH.  Patient with multiple admissions in the past for the same, as well with known history of cholelithiasis, high risk for surgery, patient presents to ED secondary to complaints of shortness of breath over the last few days, patient usually noncompliant with medication, but reports he has been compliant recently, unclear if he has been compliant with fluid restrictions, he denies chest pain, reports shortness of breath and dizziness. - In ED patient workup significant for elevated BNP> 4500, which is his baseline.  Creatinine at baseline of 1.74, troponins 86> 88, at baseline, chest x-ray with evidence of vascular congestion and cardiomegaly, blood pressure was significantly elevated with systolic in the 180s, patient received IV Lasix, [facility] hospitalist consulted to admit.   Assessment and Plan:  Hypertensive emergency with acute heart failure - BPs slowly coming down with treatments  - following closely in stepdown ICU - continue hydralazine 25 mg TID for better BP control but gave holding parameters, depending on BP measures may need to reduce dose back down to 10 mg    Acute on chronic combined systolic and diastolic CHF -Patient with known history of low EF at 25%, poor compliance with fluid restriction and medications. -Volume overload on imaging, has +1 edema, and elevated BNP -Continue with IV Lasix 40 mg IV twice daily. -Continue with Coreg dose was increased on admission to 12.5 mg BID, follow for tolerance. -Reviewing cardiology not on entresto due to orthostatic hypotension in the  past, and not on SGLT 2 inhibitors due to frequent UTIs. - Continue with daily weights, strict ins and out. - Continue with home Verquovo - ReDs vest reading 29; stop IV lasix and resume home torsemide 20 mg BID starting 4/14, with potassium supplement   Paroxysmal atrial fibrillation Sinew with Eliquis for anticoagulation Continue with Coreg 12.5 mg p.o. twice daily as blood pressure is elevated (Home dose 6.25 mg p.o. twice daily   Chest Pain - resolved now - HS troponin tests reassuring - NTG pRN - continue heart failure treatment   Chronic obstructive pulmonary disease  He is with no active wheezing, received several doses of IV solumedrol which completed   CKD stage 3b, GFR 30-44 ml/min  - Function appears at baseline, continue to monitor as on IV diuresis   Tobacco abuse -He was counseled, reports he cut down, started on nicotine patch   CAD -Troponins elevated, but non-ACS pattern, at baseline -Continue with Coreg, losartan, and statin, not on aspirin due to being on Eliquis   Prolonged QTc - EKG showing prolonged QTc 561, repeat showing QTc at 508, magnesium above 2, monitor on telemetry   Goals of care discussion - Patient with frequent admissions, and ED visits, with significant subjective dyspnea, he is with COPD, still smoking, with significant cardiomyopathy with low EF, poor functional status, I have discussed with him cessation of palliative care medicine to address goals of care and symptom management, he is agreeable, so consult was placed  DVT prophylaxis: apixaban  Code Status: Full  Family Communication:  Disposition: anticipating home in a couple of days    Consultants:  Palliative medicine service   Procedures:   Antimicrobials:    Subjective: Pt reports that he rested better overnight, he did not have anymore chest pain.    Objective: Vitals:   05/14/23 1046 05/14/23 1105 05/14/23 1200 05/14/23 1300  BP:  110/62 117/75 (!) 117/49  Pulse:  (!)  57 (!) 50 65  Resp:  16 15 16   Temp:   (!) 97.5 F (36.4 C)   TempSrc:   Oral   SpO2: 100% 100% 100% 98%  Weight:      Height:        Intake/Output Summary (Last 24 hours) at 05/14/2023 1413 Last data filed at 05/14/2023 1109 Gross per 24 hour  Intake 240 ml  Output 2400 ml  Net -2160 ml   Filed Weights   05/10/23 1523 05/10/23 2058 05/13/23 0248  Weight: 64.4 kg 61.3 kg 61.6 kg   Examination:  General exam: Appears calm and comfortable  Respiratory system: Clear to auscultation. Respiratory effort normal. Cardiovascular system: normal S1 & S2 heard. No JVD, murmurs, rubs, gallops or clicks. trace pedal edema. Gastrointestinal system: Abdomen is nondistended, soft and nontender. No organomegaly or masses felt. Normal bowel sounds heard. Central nervous system: Alert and oriented. No focal neurological deficits. Extremities: trace pretibial edema BLEs, Symmetric 5 x 5 power. Skin: No rashes, lesions or ulcers. Psychiatry: Judgement and insight appear normal. Mood & affect appropriate.   Data Reviewed: I have personally reviewed following labs and imaging studies  CBC: Recent Labs  Lab 05/10/23 1659 05/11/23 0438  WBC 7.8 3.1*  NEUTROABS 5.2  --   HGB 12.6* 12.0*  HCT 40.4 38.4*  MCV 88.4 88.7  PLT 223 206    Basic Metabolic Panel: Recent Labs  Lab 05/10/23 1539 05/11/23 0438 05/12/23 0447 05/13/23 0254 05/14/23 0312  NA 138 139 138 142 139  K 3.8 4.2 3.2* 3.3* 3.5  CL 104 107 107 105 105  CO2 23 24 24 26 27   GLUCOSE 133* 280* 178* 114* 129*  BUN 30* 34* 37* 46* 42*  CREATININE 1.74* 1.74* 1.63* 1.72* 1.55*  CALCIUM 9.3 8.9 8.6* 8.6* 8.5*  MG 2.2  --  2.2 2.0  --     CBG: No results for input(s): "GLUCAP" in the last 168 hours.  Recent Results (from the past 240 hours)  MRSA Next Gen by PCR, Nasal     Status: None   Collection Time: 05/09/23  9:17 PM   Specimen: Anterior Nasal Swab  Result Value Ref Range Status   MRSA by PCR Next Gen NOT DETECTED  NOT DETECTED Final    Comment: (NOTE) The GeneXpert MRSA Assay (FDA approved for NASAL specimens only), is one component of a comprehensive MRSA colonization surveillance program. It is not intended to diagnose MRSA infection nor to guide or monitor treatment for MRSA infections. Test performance is not FDA approved in patients less than 5 years old. Performed at [facility], [address] [place], [place] [address]   Resp panel by RT-PCR (RSV, Flu A&B, Covid) Anterior Nasal Swab     Status: None   Collection Time: 05/10/23  9:17 PM   Specimen: Anterior Nasal Swab  Result Value Ref Range Status   SARS Coronavirus 2 by RT PCR NEGATIVE NEGATIVE Final    Comment: (NOTE) SARS-CoV-2 target nucleic acids are NOT DETECTED.  The SARS-CoV-2 RNA is generally detectable in upper respiratory specimens during the acute phase of infection. The lowest concentration of SARS-CoV-2 viral copies this assay can detect  is 138 copies/mL. A negative result does not preclude SARS-Cov-2 infection and should not be used as the sole basis for treatment or other patient management decisions. A negative result may occur with  improper specimen collection/handling, submission of specimen other than nasopharyngeal swab, presence of viral mutation(s) within the areas targeted by this assay, and inadequate number of viral copies(<138 copies/mL). A negative result must be combined with clinical observations, patient history, and epidemiological information. The expected result is Negative.  Fact Sheet for Patients:  BloggerCourse.com  Fact Sheet for Healthcare Providers:  SeriousBroker.it  This test is no t yet approved or cleared by the [place]  FDA and  has been authorized for detection and/or diagnosis of SARS-CoV-2 by FDA under an Emergency Use Authorization (EUA). This EUA will remain  in effect (meaning this test can be used) for the duration of  the COVID-19 declaration under Section 564(b)(1) of the Act, 21 U.S.C.section 360bbb-3(b)(1), unless the authorization is terminated  or revoked sooner.       Influenza A by PCR NEGATIVE NEGATIVE Final   Influenza B by PCR NEGATIVE NEGATIVE Final    Comment: (NOTE) The Xpert Xpress SARS-CoV-2/FLU/RSV plus assay is intended as an aid in the diagnosis of influenza from Nasopharyngeal swab specimens and should not be used as a sole basis for treatment. Nasal washings and aspirates are unacceptable for Xpert Xpress SARS-CoV-2/FLU/RSV testing.  Fact Sheet for Patients: BloggerCourse.com  Fact Sheet for Healthcare Providers: SeriousBroker.it  This test is not yet approved or cleared by the [place]  FDA and has been authorized for detection and/or diagnosis of SARS-CoV-2 by FDA under an Emergency Use Authorization (EUA). This EUA will remain in effect (meaning this test can be used) for the duration of the COVID-19 declaration under Section 564(b)(1) of the Act, 21 U.S.C. section 360bbb-3(b)(1), unless the authorization is terminated or revoked.     Resp Syncytial Virus by PCR NEGATIVE NEGATIVE Final    Comment: (NOTE) Fact Sheet for Patients: BloggerCourse.com  Fact Sheet for Healthcare Providers: SeriousBroker.it  This test is not yet approved or cleared by the [place]  FDA and has been authorized for detection and/or diagnosis of SARS-CoV-2 by FDA under an Emergency Use Authorization (EUA). This EUA will remain in effect (meaning this test can be used) for the duration of the COVID-19 declaration under Section 564(b)(1) of the Act, 21 U.S.C. section 360bbb-3(b)(1), unless the authorization is terminated or revoked.  Performed at [facility], [address] [place], [place] [address]      Radiology Studies: DG CHEST PORT 1 VIEW Result Date: 05/14/2023 CLINICAL  DATA:  [id number] CHF (congestive heart failure) (HCC) [id number] EXAM: PORTABLE CHEST - 1 VIEW COMPARISON:  05/10/2023 FINDINGS: Chronic coarse interstitial prominence. No new infiltrate or overt edema. For Heart size and mediastinal contours are within normal limits. No effusion. Visualized bones unremarkable. IMPRESSION: Chronic interstitial prominence. No acute findings. Electronically Signed   By: Nicoletta Barrier M.D.   On: 05/14/2023 08:55     Scheduled Meds:  apixaban  2.5 mg Oral BID   atorvastatin  40 mg Oral QHS   budeson-glycopyrrolate-formoterol  2 puff Inhalation BID   carvedilol  12.5 mg Oral BID WC   Chlorhexidine Gluconate Cloth  6 each Topical Daily   feeding supplement  237 mL Oral BID BM   hydrALAZINE  25 mg Oral Q8H   losartan  25 mg Oral Daily   nicotine  14 mg Transdermal Daily   potassium chloride  40 mEq Oral  Daily   [START ON 05/15/2023] torsemide  20 mg Oral BID   Vericiguat  2.5 mg Oral Daily   Continuous Infusions:   LOS: 4 days   Time spent: 55 mins  Iceis Knab Lincoln Renshaw, MD How to contact the [facility] Attending or Consulting provider 7A - 7P or covering provider during after hours 7P -7A, for this patient?  Check the care team in [facility] and look for a) attending/consulting TRH provider listed and b) the TRH team listed Log into [email or web address] to find provider on call.  Locate the [facility] provider you are looking for under [facility] and page to a number that you can be directly reached. If you still have difficulty reaching the provider, please page the [facility] (Director on Call) for the Hospitalists listed on amion for assistance.  05/14/2023, 2:13 PM

## 2023-05-14 NOTE — Plan of Care (Signed)

## 2023-05-15 DIAGNOSIS — I5043 Acute on chronic combined systolic (congestive) and diastolic (congestive) heart failure: Secondary | ICD-10-CM | POA: Diagnosis not present

## 2023-05-15 DIAGNOSIS — N1832 Chronic kidney disease, stage 3b: Secondary | ICD-10-CM | POA: Diagnosis not present

## 2023-05-15 DIAGNOSIS — Z515 Encounter for palliative care: Secondary | ICD-10-CM | POA: Diagnosis not present

## 2023-05-15 DIAGNOSIS — Z7189 Other specified counseling: Secondary | ICD-10-CM | POA: Diagnosis not present

## 2023-05-15 DIAGNOSIS — I509 Heart failure, unspecified: Secondary | ICD-10-CM | POA: Diagnosis not present

## 2023-05-15 DIAGNOSIS — I160 Hypertensive urgency: Secondary | ICD-10-CM | POA: Diagnosis not present

## 2023-05-15 DIAGNOSIS — Z720 Tobacco use: Secondary | ICD-10-CM | POA: Diagnosis not present

## 2023-05-15 LAB — BASIC METABOLIC PANEL WITH GFR
Anion gap: 9 (ref 5–15)
BUN: 45 mg/dL — ABNORMAL HIGH (ref 8–23)
CO2: 27 mmol/L (ref 22–32)
Calcium: 8.7 mg/dL — ABNORMAL LOW (ref 8.9–10.3)
Chloride: 102 mmol/L (ref 98–111)
Creatinine, Ser: 1.53 mg/dL — ABNORMAL HIGH (ref 0.61–1.24)
GFR, Estimated: 49 mL/min — ABNORMAL LOW (ref >60)
Glucose, Bld: 141 mg/dL — ABNORMAL HIGH (ref 70–99)
Potassium: 3.5 mmol/L (ref 3.5–5.1)
Sodium: 138 mmol/L (ref 135–145)

## 2023-05-15 MED ORDER — POTASSIUM CHLORIDE CRYS ER 20 MEQ PO TBCR
40.0000 meq | EXTENDED_RELEASE_TABLET | Freq: Two times a day (BID) | ORAL | Status: DC
  Administered 2023-05-15 – 2023-05-17 (×4): 40 meq via ORAL
  Filled 2023-05-15 (×4): qty 2

## 2023-05-15 NOTE — Progress Notes (Signed)
 PROGRESS NOTE   Michael [name]  ZOX:[id number] DOB: 27-Mar-1955 DOA: 05/10/2023 PCP: [name], MD   Chief Complaint  Patient presents with   Shortness of Breath   Dizziness   Level of care: Telemetry  Brief Admission History:  68 y.o. male, Michael [name] is a 68 y.o. male with medical history significant for systolic and diastolic CHF, atrial fibrillation, COPD, hypertension, CKD 3, BPH.  Patient with multiple admissions in the past for the same, as well with known history of cholelithiasis, high risk for surgery, patient presents to ED secondary to complaints of shortness of breath over the last few days, patient usually noncompliant with medication, but reports he has been compliant recently, unclear if he has been compliant with fluid restrictions, he denies chest pain, reports shortness of breath and dizziness. - In ED patient workup significant for elevated BNP> 4500, which is his baseline.  Creatinine at baseline of 1.74, troponins 86> 88, at baseline, chest x-ray with evidence of vascular congestion and cardiomegaly, blood pressure was significantly elevated with systolic in the 180s, patient received IV Lasix, [facility]ist consulted to admit.   Assessment and Plan:  Hypertensive emergency with acute heart failure - BPs slowly coming down with treatments  - following closely in stepdown ICU - continue hydralazine 25 mg TID for better BP control but gave holding parameters, depending on BP measures may need to reduce dose back down to 10 mg    Acute on chronic combined systolic and diastolic CHF -Patient with known history of low EF at 25%, poor compliance with fluid restriction and medications. -He was treated with IV Lasix 40 mg IV twice daily thru 4/13, then transitioned to oral torsemide home dose. -Continue with Coreg dose was increased on admission to 12.5 mg BID, follow for tolerance. -Reviewing cardiology not on entresto due to orthostatic hypotension in the  past, and not on SGLT 2 inhibitors due to frequent UTIs. - Continue with daily weights, strict ins and out. - Continue with home Verquovo - ReDs vest reading 29; stop IV lasix and resume home torsemide 20 mg BID starting 4/14, with potassium supplement -monitor to see if he is adequately diuresing on home dose torsemide 20 mg BID -possible DC home in next 1-2 days.    Paroxysmal atrial fibrillation Sinew with Eliquis for anticoagulation Continue with Coreg 12.5 mg p.o. twice daily as blood pressure is elevated (Home dose 6.25 mg p.o. twice daily   Chest Pain - resolved now - HS troponin tests reassuring - NTG pRN - continue heart failure treatment   Chronic obstructive pulmonary disease  He is with no active wheezing, received several doses of IV solumedrol which completed   CKD stage 3b, GFR 30-44 ml/min  - Function appears at baseline, continue to monitor as on IV diuresis   Tobacco abuse -He was counseled, reports he cut down on tobacco, started on nicotine patch   CAD -Troponins elevated, but non-ACS pattern, at baseline -Continue with Coreg, losartan, and statin, not on aspirin due to being on Eliquis   Prolonged QTc - EKG showing prolonged QTc 561, repeat showing QTc at 508, magnesium above 2, monitor on telemetry   Goals of care discussion - Patient with frequent admissions, and ED visits, with significant subjective dyspnea, he is with COPD, still smoking, with significant cardiomyopathy with low EF, poor functional status, I have discussed with him cessation of palliative care medicine to address goals of care and symptom management, he is agreeable, so consult  was placed  DVT prophylaxis: apixaban  Code Status: Full  Family Communication:  Disposition: anticipating home in 1-2 days pending how he responds to oral torsemide     Consultants:  Palliative medicine service   Procedures:   Antimicrobials:    Subjective: Pt says he is tolerating the move from  stepdown ICU to telemetry.  He denies CP and SOB.    Objective: Vitals:   05/15/23 0300 05/15/23 0717 05/15/23 0730 05/15/23 1107  BP: 135/70  121/76 137/73  Pulse: 67  (!) 57 62  Resp: (!) 21   20  Temp: 98.5 F (36.9 C)  98.1 F (36.7 C) 98 F (36.7 C)  TempSrc: Oral  Oral Oral  SpO2: 100% 100% 97% 98%  Weight:      Height:        Intake/Output Summary (Last 24 hours) at 05/15/2023 1626 Last data filed at 05/15/2023 0500 Gross per 24 hour  Intake --  Output 900 ml  Net -900 ml   Filed Weights   05/10/23 1523 05/10/23 2058 05/13/23 0248  Weight: 64.4 kg 61.3 kg 61.6 kg   Examination:  General exam: Appears calm and comfortable  Respiratory system: Clear to auscultation. Respiratory effort normal. Cardiovascular system: normal S1 & S2 heard. No JVD, murmurs, rubs, gallops or clicks. trace pedal edema. Gastrointestinal system: Abdomen is nondistended, soft and nontender. No organomegaly or masses felt. Normal bowel sounds heard. Central nervous system: Alert and oriented. No focal neurological deficits. Extremities: trace pretibial edema BLEs, Symmetric 5 x 5 power. Skin: No rashes, lesions or ulcers. Psychiatry: Judgement and insight appear normal. Mood & affect appropriate.   Data Reviewed: I have personally reviewed following labs and imaging studies  CBC: Recent Labs  Lab 05/10/23 1659 05/11/23 0438  WBC 7.8 3.1*  NEUTROABS 5.2  --   HGB 12.6* 12.0*  HCT 40.4 38.4*  MCV 88.4 88.7  PLT 223 206    Basic Metabolic Panel: Recent Labs  Lab 05/10/23 1539 05/11/23 0438 05/12/23 0447 05/13/23 0254 05/14/23 0312 05/15/23 0457  NA 138 139 138 142 139 138  K 3.8 4.2 3.2* 3.3* 3.5 3.5  CL 104 107 107 105 105 102  CO2 23 24 24 26 27 27   GLUCOSE 133* 280* 178* 114* 129* 141*  BUN 30* 34* 37* 46* 42* 45*  CREATININE 1.74* 1.74* 1.63* 1.72* 1.55* 1.53*  CALCIUM 9.3 8.9 8.6* 8.6* 8.5* 8.7*  MG 2.2  --  2.2 2.0  --   --     CBG: No results for input(s):  "GLUCAP" in the last 168 hours.  Recent Results (from the past 240 hours)  MRSA Next Gen by PCR, Nasal     Status: None   Collection Time: 05/09/23  9:17 PM   Specimen: Anterior Nasal Swab  Result Value Ref Range Status   MRSA by PCR Next Gen NOT DETECTED NOT DETECTED Final    Comment: (NOTE) The GeneXpert MRSA Assay (FDA approved for NASAL specimens only), is one component of a comprehensive MRSA colonization surveillance program. It is not intended to diagnose MRSA infection nor to guide or monitor treatment for MRSA infections. Test performance is not FDA approved in patients less than 72 years old. Performed at [facility], [address] [place], [place] [address]   Resp panel by RT-PCR (RSV, Flu A&B, Covid) Anterior Nasal Swab     Status: None   Collection Time: 05/10/23  9:17 PM   Specimen: Anterior Nasal Swab  Result Value Ref  Range Status   SARS Coronavirus 2 by RT PCR NEGATIVE NEGATIVE Final    Comment: (NOTE) SARS-CoV-2 target nucleic acids are NOT DETECTED.  The SARS-CoV-2 RNA is generally detectable in upper respiratory specimens during the acute phase of infection. The lowest concentration of SARS-CoV-2 viral copies this assay can detect is 138 copies/mL. A negative result does not preclude SARS-Cov-2 infection and should not be used as the sole basis for treatment or other patient management decisions. A negative result may occur with  improper specimen collection/handling, submission of specimen other than nasopharyngeal swab, presence of viral mutation(s) within the areas targeted by this assay, and inadequate number of viral copies(<138 copies/mL). A negative result must be combined with clinical observations, patient history, and epidemiological information. The expected result is Negative.  Fact Sheet for Patients:  BloggerCourse.com  Fact Sheet for Healthcare Providers:  SeriousBroker.it  This test is no  t yet approved or cleared by the [place]  FDA and  has been authorized for detection and/or diagnosis of SARS-CoV-2 by FDA under an Emergency Use Authorization (EUA). This EUA will remain  in effect (meaning this test can be used) for the duration of the COVID-19 declaration under Section 564(b)(1) of the Act, 21 U.S.C.section 360bbb-3(b)(1), unless the authorization is terminated  or revoked sooner.       Influenza A by PCR NEGATIVE NEGATIVE Final   Influenza B by PCR NEGATIVE NEGATIVE Final    Comment: (NOTE) The Xpert Xpress SARS-CoV-2/FLU/RSV plus assay is intended as an aid in the diagnosis of influenza from Nasopharyngeal swab specimens and should not be used as a sole basis for treatment. Nasal washings and aspirates are unacceptable for Xpert Xpress SARS-CoV-2/FLU/RSV testing.  Fact Sheet for Patients: BloggerCourse.com  Fact Sheet for Healthcare Providers: SeriousBroker.it  This test is not yet approved or cleared by the [place]  FDA and has been authorized for detection and/or diagnosis of SARS-CoV-2 by FDA under an Emergency Use Authorization (EUA). This EUA will remain in effect (meaning this test can be used) for the duration of the COVID-19 declaration under Section 564(b)(1) of the Act, 21 U.S.C. section 360bbb-3(b)(1), unless the authorization is terminated or revoked.     Resp Syncytial Virus by PCR NEGATIVE NEGATIVE Final    Comment: (NOTE) Fact Sheet for Patients: BloggerCourse.com  Fact Sheet for Healthcare Providers: SeriousBroker.it  This test is not yet approved or cleared by the [place]  FDA and has been authorized for detection and/or diagnosis of SARS-CoV-2 by FDA under an Emergency Use Authorization (EUA). This EUA will remain in effect (meaning this test can be used) for the duration of the COVID-19 declaration under Section  564(b)(1) of the Act, 21 U.S.C. section 360bbb-3(b)(1), unless the authorization is terminated or revoked.  Performed at [facility], [address], [place], [place] [address]      Radiology Studies: DG CHEST PORT 1 VIEW Result Date: 05/14/2023 CLINICAL DATA:  28413 CHF (congestive heart failure) (HCC) 97293 EXAM: PORTABLE CHEST - 1 VIEW COMPARISON:  05/10/2023 FINDINGS: Chronic coarse interstitial prominence. No new infiltrate or overt edema. For Heart size and mediastinal contours are within normal limits. No effusion. Visualized bones unremarkable. IMPRESSION: Chronic interstitial prominence. No acute findings. Electronically Signed   By: Nicoletta Barrier M.D.   On: 05/14/2023 08:55     Scheduled Meds:  apixaban  2.5 mg Oral BID   atorvastatin  40 mg Oral QHS   budeson-glycopyrrolate-formoterol  2 puff Inhalation BID   carvedilol  12.5 mg Oral BID  WC   Chlorhexidine Gluconate Cloth  6 each Topical Daily   feeding supplement  237 mL Oral BID BM   hydrALAZINE  25 mg Oral Q8H   losartan  25 mg Oral Daily   nicotine  14 mg Transdermal Daily   potassium chloride  40 mEq Oral Daily   torsemide  20 mg Oral BID   Vericiguat  2.5 mg Oral Daily   Continuous Infusions:   LOS: 5 days   Time spent: 40 mins  Antoinne Spadaccini Lincoln Renshaw, MD How to contact the [facility] Attending or Consulting provider 7A - 7P or covering provider during after hours 7P -7A, for this patient?  Check the care team in [facility] and look for a) attending/consulting [facility] provider listed and b) the TR[facility] team listed Log into [email or web address] to find provider on call.  Locate the [facility] provider you are looking for under [facility] and page to a number that you can be directly reached. If you still have difficulty reaching the provider, please page the [facility] (Director on Call) for the Hospitalists listed on amion for assistance.  05/15/2023, 4:26 PM

## 2023-05-15 NOTE — Plan of Care (Signed)

## 2023-05-15 NOTE — Progress Notes (Signed)
 Palliative:   Michael [name] is resting quietly in bed.  He appears chronically ill and frail.  He greets me, making and mostly keeping eye contact.  He is alert and oriented, able to make his needs known.  There is no family at bedside at this time.  We talked about his acute illnesses and the treatment plan.  We talked about possible discharge home tomorrow.  Michael [name] shares that he feels that he is getting ready.  No further needs or questions at this time.  Conference with attending, bedside nursing staff, transition of care team related to patient condition, needs, goals of care, disposition.    Plan: During this hospital stay Michael [name] endorses full scope/full code but no tracheotomy.  He continues to decline outpatient palliative/hospice services.  Would rehospitalize as needed.  25 minutes  Alverta Caccamo, NP Palliative medicine team Team phone [phone]

## 2023-05-15 NOTE — Evaluation (Signed)
 Physical Therapy Evaluation Patient Details Name: Michael [name] MRN: [id number] DOB: 06/30/1955 Today's Date: 05/15/2023  History of Present Illness  Michael [name]  is a 68 y.o. male, Michael [name] is a 68 y.o. male with medical history significant for systolic and diastolic CHF, atrial fibrillation, COPD, hypertension, CKD 3, BPH.  Patient with multiple admissions in the past for the same, as well with known history of cholelithiasis, high risk for surgery, patient presents to ED secondary to complaints of shortness of breath over the last few days, patient usually noncompliant with medication, but reports he has been compliant recently, unclear if he has been compliant with fluid restrictions, he denies chest pain, reports shortness of breath and dizziness.   Clinical Impression  Patient functioning near baseline for functional mobility and gait with good return for bed mobility, transfers and ambulating in room/hallway without loss of balance.  Patient encouraged to ambulate in room ad lib and with nursing staff supervising in hallways. Plan: Patient discharged to care of nursing for ambulation daily as tolerated for length of stay.          If plan is discharge home, recommend the following: Help with stairs or ramp for entrance;Assistance with cooking/housework   Can travel by private vehicle        Equipment Recommendations None recommended by PT  Recommendations for Other Services       Functional Status Assessment Patient has not had a recent decline in their functional status     Precautions / Restrictions Precautions Precautions: Fall Restrictions Weight Bearing Restrictions Per Provider Order: No      Mobility  Bed Mobility Overal bed mobility: Modified Independent                  Transfers Overall transfer level: Modified independent                      Ambulation/Gait Ambulation/Gait assistance: Modified independent  (Device/Increase time) Gait Distance (Feet): 100 Feet Assistive device: None Gait Pattern/deviations: Decreased step length - right, Decreased step length - left, Decreased stride length Gait velocity: decreased     General Gait Details: slightly labored movement with occasional stumbling without loss of balance, good return for ambulating in room, hallway without requiring use of an AD  Stairs            Wheelchair Mobility     Tilt Bed    Modified Rankin (Stroke Patients Only)       Balance Overall balance assessment: Mild deficits observed, not formally tested                                           Pertinent Vitals/Pain Pain Assessment Pain Assessment: No/denies pain    Home Living Family/patient expects to be discharged to:: Private residence Living Arrangements: Non-relatives/Friends Available Help at Discharge: Friend(s);Available PRN/intermittently Type of Home: House Home Access: Stairs to enter Entrance Stairs-Rails: Doctor, general practice of Steps: 2 steps in front, 6 steps in back Alternate Level Stairs-Number of Steps: full flight Home Layout: Multi-level Home Equipment: None      Prior Function Prior Level of Function : Independent/Modified Independent             Mobility Comments: Household and short distanced community ambulator without use of an AD, does not drive ADLs Comments: Independent     Extremity/Trunk Assessment  Upper Extremity Assessment Upper Extremity Assessment: Overall WFL for tasks assessed    Lower Extremity Assessment Lower Extremity Assessment: Overall WFL for tasks assessed    Cervical / Trunk Assessment Cervical / Trunk Assessment: Normal  Communication   Communication Communication: No apparent difficulties    Cognition Arousal: Alert Behavior During Therapy: WFL for tasks assessed/performed   PT - Cognitive impairments: No apparent impairments                          Following commands: Intact       Cueing Cueing Techniques: Verbal cues     General Comments      Exercises     Assessment/Plan    PT Assessment Patient does not need any further PT services  PT Problem List         PT Treatment Interventions      PT Goals (Current goals can be found in the Care Plan section)  Acute Rehab PT Goals Patient Stated Goal: return home with friends to assist PT Goal Formulation: With patient Time For Goal Achievement: 05/15/23 Potential to Achieve Goals: Good    Frequency       Co-evaluation               AM-PAC PT "6 Clicks" Mobility  Outcome Measure Help needed turning from your back to your side while in a flat bed without using bedrails?: None Help needed moving from lying on your back to sitting on the side of a flat bed without using bedrails?: None Help needed moving to and from a bed to a chair (including a wheelchair)?: None Help needed standing up from a chair using your arms (e.g., wheelchair or bedside chair)?: None Help needed to walk in hospital room?: A Little Help needed climbing 3-5 steps with a railing? : A Little 6 Click Score: 22    End of Session   Activity Tolerance: Patient tolerated treatment well Patient left: in bed;with call bell/phone within reach Nurse Communication: Mobility status PT Visit Diagnosis: Unsteadiness on feet (R26.81);Other abnormalities of gait and mobility (R26.89);Muscle weakness (generalized) (M62.81)    Time: 4098-1191 PT Time Calculation (min) (ACUTE ONLY): 20 min   Charges:   PT Evaluation $PT Eval Moderate Complexity: 1 Mod PT Treatments $Therapeutic Activity: 8-22 mins PT General Charges $$ ACUTE PT VISIT: 1 Visit         12:19 PM, 05/15/23 Walton Guppy, MPT Physical Therapist with [facility] 336 [phone] office [phone] mobile phone

## 2023-05-16 DIAGNOSIS — J449 Chronic obstructive pulmonary disease, unspecified: Secondary | ICD-10-CM | POA: Diagnosis not present

## 2023-05-16 DIAGNOSIS — I2510 Atherosclerotic heart disease of native coronary artery without angina pectoris: Secondary | ICD-10-CM | POA: Diagnosis not present

## 2023-05-16 DIAGNOSIS — D638 Anemia in other chronic diseases classified elsewhere: Secondary | ICD-10-CM | POA: Diagnosis not present

## 2023-05-16 DIAGNOSIS — Z91199 Patient's noncompliance with other medical treatment and regimen due to unspecified reason: Secondary | ICD-10-CM | POA: Diagnosis not present

## 2023-05-16 LAB — BASIC METABOLIC PANEL WITH GFR
Anion gap: 8 (ref 5–15)
BUN: 45 mg/dL — ABNORMAL HIGH (ref 8–23)
CO2: 26 mmol/L (ref 22–32)
Calcium: 8.6 mg/dL — ABNORMAL LOW (ref 8.9–10.3)
Chloride: 105 mmol/L (ref 98–111)
Creatinine, Ser: 1.64 mg/dL — ABNORMAL HIGH (ref 0.61–1.24)
GFR, Estimated: 45 mL/min — ABNORMAL LOW (ref >60)
Glucose, Bld: 196 mg/dL — ABNORMAL HIGH (ref 70–99)
Potassium: 3.8 mmol/L (ref 3.5–5.1)
Sodium: 139 mmol/L (ref 135–145)

## 2023-05-16 LAB — MAGNESIUM: Magnesium: 2.2 mg/dL (ref 1.7–2.4)

## 2023-05-16 NOTE — Progress Notes (Signed)
 PROGRESS NOTE   Michael [name]  ZOX:[id number] DOB: [birth year] DOA: 05/10/2023 PCP: Benita Stabile, MD   Chief Complaint  Patient presents with   Shortness of Breath   Dizziness   Level of care: Telemetry  Brief Admission History:  68 y.o. male, Michael [name] is a 68 y.o. male with medical history significant for systolic and diastolic CHF, atrial fibrillation, COPD, hypertension, CKD 3, BPH.  Patient with multiple admissions in the past for the same, as well with known history of cholelithiasis, high risk for surgery, patient presents to ED secondary to complaints of shortness of breath over the last few days, patient usually noncompliant with medication, but reports he has been compliant recently, unclear if he has been compliant with fluid restrictions, he denies chest pain, reports shortness of breath and dizziness. - In ED patient workup significant for elevated BNP> 4500, which is his baseline.  Creatinine at baseline of 1.74, troponins 86> 88, at baseline, chest x-ray with evidence of vascular congestion and cardiomegaly, blood pressure was significantly elevated with systolic in the 180s, patient received IV Lasix, [facility] consulted to admit.   Assessment and Plan:  Hypertensive emergency with acute heart failure - BPs slowly coming down with treatments  - following closely in stepdown ICU - continue hydralazine 25 mg TID for better BP control but gave holding parameters, depending on BP measures may need to reduce dose back down to 10 mg    Acute on chronic combined systolic and diastolic CHF -Patient with known history of low EF at 25%, poor compliance with fluid restriction and medications. -He was treated with IV Lasix 40 mg IV twice daily thru 4/13, then transitioned to oral torsemide home dose. -Continue with Coreg dose was increased on admission to 12.5 mg BID, follow for tolerance. -Reviewing cardiology not on entresto due to orthostatic hypotension in the  past, and not on SGLT 2 inhibitors due to frequent UTIs. - Continue with daily weights, strict ins and out. - Continue with home Verquovo - ReDs vest reading 29; stop IV lasix and resume home torsemide 20 mg BID starting 4/14, with potassium supplement -monitor to see if he is adequately diuresing on home dose torsemide 20 mg BID -possible DC home tomorrow if he continues diuresing on home oral torsemide.    Paroxysmal atrial fibrillation Sinew with Eliquis for anticoagulation Continue with updated dose of Coreg 12.5 mg p.o. twice daily (Home dose 6.25 mg p.o. twice daily) -he is tolerating the higher dose of carvedilol well, would DC home on carvedilol 12.5 mg BID   Chest Pain - resolved now - HS troponin tests reassuring - NTG pRN - continue acute on chronic heart failure treatment   Chronic obstructive pulmonary disease  He is with not actively wheezing, received several doses of IV solumedrol which completed   CKD stage 3b, GFR 30-44 ml/min  - Function appears at baseline, continue to monitor as on IV diuresis   Tobacco abuse -He was counseled, reports he cut down on tobacco, started on nicotine patch   CAD -Troponins elevated, but non-ACS pattern, at baseline -Continue with Coreg, losartan, and statin, not on aspirin due to being on Eliquis   Prolonged QTc - EKG showing prolonged QTc 561, repeat showing QTc at 508, magnesium above 2, monitor on telemetry   Goals of care discussion - Patient with frequent admissions, and ED visits, with significant subjective dyspnea, he is with COPD, still smoking, with significant cardiomyopathy with low EF, poor functional status,  I have discussed with him cessation of palliative care medicine to address goals of care and symptom management, he is agreeable, so consult was placed  DVT prophylaxis: apixaban  Code Status: Full  Family Communication:  Disposition: anticipating home in 1 day pending how he responds to oral torsemide      Consultants:  Palliative medicine service   Procedures:   Antimicrobials:    Subjective: Pt having less SOB today, tolerating torsemide therapy oral tablets. Does not feel ready to go home today.    Objective: Vitals:   05/15/23 2017 05/16/23 0319 05/16/23 0719 05/16/23 1354  BP:  129/70  137/70  Pulse:  (!) 56  (!) 59  Resp:    14  Temp:  98.1 F (36.7 C)  98.2 F (36.8 C)  TempSrc:  Oral    SpO2: 98% 99% 98% 100%  Weight:      Height:        Intake/Output Summary (Last 24 hours) at 05/16/2023 1612 Last data filed at 05/16/2023 1400 Gross per 24 hour  Intake 480 ml  Output 3200 ml  Net -2720 ml   Filed Weights   05/10/23 1523 05/10/23 2058 05/13/23 0248  Weight: 64.4 kg 61.3 kg 61.6 kg   Examination:  General exam: Appears calm and comfortable  Respiratory system: Clear to auscultation. Respiratory effort normal. Cardiovascular system: normal S1 & S2 heard. No JVD, murmurs, rubs, gallops or clicks. trace pedal edema. Gastrointestinal system: Abdomen is nondistended, soft and nontender. No organomegaly or masses felt. Normal bowel sounds heard. Central nervous system: Alert and oriented. No focal neurological deficits. Extremities: trace pretibial edema BLEs, Symmetric 5 x 5 power. Skin: No rashes, lesions or ulcers. Psychiatry: Judgement and insight appear normal. Mood & affect appropriate.   Data Reviewed: I have personally reviewed following labs and imaging studies  CBC: Recent Labs  Lab 05/10/23 1659 05/11/23 0438  WBC 7.8 3.1*  NEUTROABS 5.2  --   HGB 12.6* 12.0*  HCT 40.4 38.4*  MCV 88.4 88.7  PLT 223 206    Basic Metabolic Panel: Recent Labs  Lab 05/10/23 1539 05/11/23 0438 05/12/23 0447 05/13/23 0254 05/14/23 0312 05/15/23 0457 05/16/23 0504  NA 138   < > 138 142 139 138 139  K 3.8   < > 3.2* 3.3* 3.5 3.5 3.8  CL 104   < > 107 105 105 102 105  CO2 23   < > 24 26 27 27 26   GLUCOSE 133*   < > 178* 114* 129* 141* 196*  BUN 30*   < >  37* 46* 42* 45* 45*  CREATININE 1.74*   < > 1.63* 1.72* 1.55* 1.53* 1.64*  CALCIUM 9.3   < > 8.6* 8.6* 8.5* 8.7* 8.6*  MG 2.2  --  2.2 2.0  --   --  2.2   < > = values in this interval not displayed.    CBG: No results for input(s): "GLUCAP" in the last 168 hours.  Recent Results (from the past 240 hours)  MRSA Next Gen by PCR, Nasal     Status: None   Collection Time: 05/09/23  9:17 PM   Specimen: Anterior Nasal Swab  Result Value Ref Range Status   MRSA by PCR Next Gen NOT DETECTED NOT DETECTED Final    Comment: (NOTE) The GeneXpert MRSA Assay (FDA approved for NASAL specimens only), is one component of a comprehensive MRSA colonization surveillance program. It is not intended to diagnose MRSA infection nor to guide  or monitor treatment for MRSA infections. Test performance is not FDA approved in patients less than 35 years old. Performed at [facility], [address], [place], [place] [address]   Resp panel by RT-PCR (RSV, Flu A&B, Covid) Anterior Nasal Swab     Status: None   Collection Time: 05/10/23  9:17 PM   Specimen: Anterior Nasal Swab  Result Value Ref Range Status   SARS Coronavirus 2 by RT PCR NEGATIVE NEGATIVE Final    Comment: (NOTE) SARS-CoV-2 target nucleic acids are NOT DETECTED.  The SARS-CoV-2 RNA is generally detectable in upper respiratory specimens during the acute phase of infection. The lowest concentration of SARS-CoV-2 viral copies this assay can detect is 138 copies/mL. A negative result does not preclude SARS-Cov-2 infection and should not be used as the sole basis for treatment or other patient management decisions. A negative result may occur with  improper specimen collection/handling, submission of specimen other than nasopharyngeal swab, presence of viral mutation(s) within the areas targeted by this assay, and inadequate number of viral copies(<138 copies/mL). A negative result must be combined with clinical observations, patient history,  and epidemiological information. The expected result is Negative.  Fact Sheet for Patients:  BloggerCourse.com  Fact Sheet for Healthcare Providers:  SeriousBroker.it  This test is no t yet approved or cleared by the [place] FDA and  has been authorized for detection and/or diagnosis of SARS-CoV-2 by FDA under an Emergency Use Authorization (EUA). This EUA will remain  in effect (meaning this test can be used) for the duration of the COVID-19 declaration under Section 564(b)(1) of the Act, 21 U.S.C.section 360bbb-3(b)(1), unless the authorization is terminated  or revoked sooner.       Influenza A by PCR NEGATIVE NEGATIVE Final   Influenza B by PCR NEGATIVE NEGATIVE Final    Comment: (NOTE) The Xpert Xpress SARS-CoV-2/FLU/RSV plus assay is intended as an aid in the diagnosis of influenza from Nasopharyngeal swab specimens and should not be used as a sole basis for treatment. Nasal washings and aspirates are unacceptable for Xpert Xpress SARS-CoV-2/FLU/RSV testing.  Fact Sheet for Patients: BloggerCourse.com  Fact Sheet for Healthcare Providers: SeriousBroker.it  This test is not yet approved or cleared by the [place] FDA and has been authorized for detection and/or diagnosis of SARS-CoV-2 by FDA under an Emergency Use Authorization (EUA). This EUA will remain in effect (meaning this test can be used) for the duration of the COVID-19 declaration under Section 564(b)(1) of the Act, 21 U.S.C. section 360bbb-3(b)(1), unless the authorization is terminated or revoked.     Resp Syncytial Virus by PCR NEGATIVE NEGATIVE Final    Comment: (NOTE) Fact Sheet for Patients: BloggerCourse.com  Fact Sheet for Healthcare Providers: SeriousBroker.it  This test is not yet approved or cleared by the [place] FDA and has  been authorized for detection and/or diagnosis of SARS-CoV-2 by FDA under an Emergency Use Authorization (EUA). This EUA will remain in effect (meaning this test can be used) for the duration of the COVID-19 declaration under Section 564(b)(1) of the Act, 21 U.S.C. section 360bbb-3(b)(1), unless the authorization is terminated or revoked.  Performed at [facility], [address] [place], [place] [address]      Radiology Studies: No results found.    Scheduled Meds:  apixaban  2.5 mg Oral BID   atorvastatin  40 mg Oral QHS   budeson-glycopyrrolate-formoterol  2 puff Inhalation BID   carvedilol  12.5 mg Oral BID WC   Chlorhexidine Gluconate  Cloth  6 each Topical Daily   feeding supplement  237 mL Oral BID BM   hydrALAZINE  25 mg Oral Q8H   losartan  25 mg Oral Daily   nicotine  14 mg Transdermal Daily   potassium chloride  40 mEq Oral BID   torsemide  20 mg Oral BID   Vericiguat  2.5 mg Oral Daily   Continuous Infusions:   LOS: 6 days   Time spent: 38 mins  Hassie Mandt Lincoln Renshaw, MD How to contact the [facility] Attending or Consulting provider 7A - 7P or covering provider during after hours 7P -7A, for this patient?  Check the care team in [facility] and look for a) attending/consulting [facility] provider listed and b) the [facility] team listed Log into [email or web address] to find provider on call.  Locate the [facility] provider you are looking for under [facility] and page to a number that you can be directly reached. If you still have difficulty reaching the provider, please page the [facility] (Director on Call) for the Hospitalists listed on amion for assistance.  05/16/2023, 4:12 PM

## 2023-05-16 NOTE — Plan of Care (Signed)

## 2023-05-16 NOTE — Plan of Care (Signed)
   Problem: Education: Goal: Knowledge of General Education information will improve Description Including pain rating scale, medication(s)/side effects and non-pharmacologic comfort measures Outcome: Progressing   Problem: Health Behavior/Discharge Planning: Goal: Ability to manage health-related needs will improve Outcome: Progressing

## 2023-05-17 ENCOUNTER — Other Ambulatory Visit: Payer: Self-pay | Admitting: *Deleted

## 2023-05-17 DIAGNOSIS — I5043 Acute on chronic combined systolic (congestive) and diastolic (congestive) heart failure: Secondary | ICD-10-CM

## 2023-05-17 DIAGNOSIS — R7989 Other specified abnormal findings of blood chemistry: Secondary | ICD-10-CM

## 2023-05-17 DIAGNOSIS — N1832 Chronic kidney disease, stage 3b: Secondary | ICD-10-CM | POA: Diagnosis not present

## 2023-05-17 LAB — BASIC METABOLIC PANEL WITH GFR
Anion gap: 8 (ref 5–15)
BUN: 46 mg/dL — ABNORMAL HIGH (ref 8–23)
CO2: 26 mmol/L (ref 22–32)
Calcium: 8.8 mg/dL — ABNORMAL LOW (ref 8.9–10.3)
Chloride: 106 mmol/L (ref 98–111)
Creatinine, Ser: 1.61 mg/dL — ABNORMAL HIGH (ref 0.61–1.24)
GFR, Estimated: 46 mL/min — ABNORMAL LOW (ref >60)
Glucose, Bld: 125 mg/dL — ABNORMAL HIGH (ref 70–99)
Potassium: 3.9 mmol/L (ref 3.5–5.1)
Sodium: 140 mmol/L (ref 135–145)

## 2023-05-17 MED ORDER — CARVEDILOL 12.5 MG PO TABS: 12.5000 mg | ORAL_TABLET | Freq: Two times a day (BID) | ORAL | 1 refills | Status: DC

## 2023-05-17 MED ORDER — LOSARTAN POTASSIUM 25 MG PO TABS: 25.0000 mg | ORAL_TABLET | Freq: Every day | ORAL | 1 refills | Status: DC

## 2023-05-17 NOTE — Progress Notes (Signed)
 Discharge instructions explained. IV removed. Home medicines returned. Family called & patient discharged.

## 2023-05-17 NOTE — Care Management Important Message (Signed)
 Important Message  Patient Details  Name: Michael [name] MRN: [id number] Date of Birth: May 21, 1955   Important Message Given:  Yes - Medicare IM     Sion Reinders L Micai Apolinar 05/17/2023, 11:34 AM

## 2023-05-17 NOTE — Discharge Summary (Signed)
 Physician Discharge Summary   Patient: Michael [name] MRN: [id number] DOB: [birth year]  Admit date:     05/10/2023  Discharge date: 05/17/23  Discharge Physician: Onalee Hua Hady Niemczyk   PCP: Benita Stabile, MD   Recommendations at discharge:   Please follow up with primary care provider within 1-2 weeks  Please repeat BMP and CBC in one week    Hospital Course: 68 y.o. male, Michael [name] is a 68 y.o. male with medical history significant for systolic and diastolic CHF, atrial fibrillation, COPD, hypertension, CKD 3, BPH.  Patient with multiple admissions in the past for the same, as well with known history of cholelithiasis, high risk for surgery, patient presents to ED secondary to complaints of shortness of breath over the last few days, patient usually noncompliant with medication, but reports he has been compliant recently, unclear if he has been compliant with fluid restrictions, he denies chest pain, reports shortness of breath and dizziness. - In ED patient workup significant for elevated BNP> 4500, which is his baseline.  Creatinine at baseline of 1.74, troponins 86> 88, at baseline, chest x-ray with evidence of vascular congestion and cardiomegaly, blood pressure was significantly elevated with systolic in the 180s, patient received IV Lasix, [facility] hospitalist consulted to admit.  Assessment and Plan: Acute on chronic HFrEF -He is clinically fluid overloaded with pleural effusions and JVD on exam -11/05/2022 echo EF 25%, mild decreased RV function, global HK, mild MR/TR -08/16/2022 echo--EF 25%, global HK, -04/30/2022 echo EF 35-40%, global HK, normal PASP, mild TR -04/20/23 Echo EF 20-25%,G2DD, +WMA mild decrease RVF, mild MR, trivial TR -Entresto was discontinued during his Early April 2024 hospitalization due to dizziness and orthostasis -not on SGLT 2 inhibitors due to frequent UTIs.  -Continued furosemide 40 mg IV twice daily>>torsemide 20 mg bid -Accurate I's and O's--NEG  9.7L -Daily weights--132 lbs at time d/c -ReDS = 29 on 4/13 -d/c home with coreg 12.5 mg bid--pt tolerated increased dose without orthostatic symptoms during the hospitalization -cotninue Verquovo, losartan   Paroxysmal Atrial Fibrillation -denies any recent palpitations and is in normal sinus rhythm by examination -coreg increased to 12.5 mg bid due to bradycardia into 50s -BP remains well controlled - No reports of active bleeding.  -continue Eliquis 5mg  BID for anticoagulation   COPD -Patient continues to smoke -continue BDs   CKD stage IIIb -Baseline creatinine 1.7-2.0 -serum creatinine 1.61 at time of d/c   Essential hypertension --coreg increased to 12.5 mg bid -continue losartan -BP remains well controlled   Elevated troponin/type II MI/demand ischemia:  -Secondary to demand ischemia from CHF, as well due to worsening renal failure -troponin 56>>57   Tobacco abuse -Tobacco cessation discussed   Right Carotid stenosis -The patient has follow-up with Dr. Leonette Most fields -05/12/2017 carotid ultrasound shows 80-99% right carotid stenosis - right TCAR in 01/2021 and recent dopplers in 11/2022 showed no hemodynamically significant stenosis along the RICA but was noted to have a moderate to large amount of left-sided plaque which is similar to prior imaging    Basal cell carcinoma of nasolabial fold -06/06/2017--right partial rhinectomy, resection of left intranasal lesion, skin graft @[facility] -Margins were clear on pathology -Most recent follow-up was 06/23/2017   Iron deficiency anemia/Advanced colonic adenoma -The patient had colonoscopy 05/31/2017--Dr. Rourk -He was noted to have numerous polyps as well as a polypoid mass in the hepatic flexure to the cecum area -pathology of mass--tubular adenoma -He was referred to general surgery, Dr. Larae Grooms for hemicolectomy -He has  been lost to follow-up -noncompliant with po iron   Mixed hyperlipidemia/CAD -Cath in  10/2021 showed mild to moderate nonobstructive CAD with medical management recommended. -no chest pain -continue coreg, atorva and repatha  LDL was previously 18 when checked in 07/2022.   Hypokalemia -replete    Consultants: none Procedures performed: none  Disposition: Home Diet recommendation:  Cardiac diet DISCHARGE MEDICATION: Allergies as of 05/17/2023       Reactions   Entresto [sacubitril-valsartan] Other (See Comments)   Orthostatic Hypotension Per pt, he has no allergies   Sglt2 Inhibitors Other (See Comments)   UTI's Per pt, he has no allergies        Medication List     STOP taking these medications    cephALEXin 500 MG capsule Commonly known as: KEFLEX       TAKE these medications    apixaban 2.5 MG Tabs tablet Commonly known as: ELIQUIS Take 1 tablet (2.5 mg total) by mouth 2 (two) times daily.   atorvastatin 40 MG tablet Commonly known as: LIPITOR Take 1 tablet (40 mg total) by mouth every evening. What changed: when to take this   Breztri Aerosphere 160-9-4.8 MCG/ACT Aero inhaler Generic drug: budeson-glycopyrrolate-formoterol Inhale 2 puffs into the lungs 2 (two) times daily.   carvedilol 12.5 MG tablet Commonly known as: COREG Take 1 tablet (12.5 mg total) by mouth 2 (two) times daily with a meal. What changed:  medication strength how much to take   diltiazem 30 MG tablet Commonly known as: CARDIZEM Take 30 mg by mouth daily as needed (heart rate >110 BPM).   losartan 25 MG tablet Commonly known as: COZAAR Take 1 tablet (25 mg total) by mouth daily.   nitroGLYCERIN 0.4 MG SL tablet Commonly known as: NITROSTAT Place 1 tablet (0.4 mg total) under the tongue every 5 (five) minutes as needed for chest pain.   potassium chloride SA 20 MEQ tablet Commonly known as: KLOR-CON M Take 1 tablet (20 mEq total) by mouth daily.   torsemide 20 MG tablet Commonly known as: DEMADEX Take 20 mg by mouth 2 (two) times daily.   Verquvo  2.5 MG Tabs Generic drug: Vericiguat Take 1 tablet (2.5 mg total) by mouth daily.        Discharge Exam: Filed Weights   05/10/23 1523 05/10/23 2058 05/13/23 0248  Weight: 64.4 kg 61.3 kg 61.6 kg   HEENT:  [place]/AT, No thrush, no icterus CV:  RRR, no rub, no S3, no S4 Lung:  CTA, no wheeze, no rhonchi Abd:  soft/+BS, NT Ext:  No edema, no lymphangitis, no synovitis, no rash   Condition at discharge: stable  The results of significant diagnostics from this hospitalization (including imaging, microbiology, ancillary and laboratory) are listed below for reference.   Imaging Studies: DG CHEST PORT 1 VIEW Result Date: 05/14/2023 CLINICAL DATA:  16109 CHF (congestive heart failure) (HCC) [id number] EXAM: PORTABLE CHEST - 1 VIEW COMPARISON:  05/10/2023 FINDINGS: Chronic coarse interstitial prominence. No new infiltrate or overt edema. For Heart size and mediastinal contours are within normal limits. No effusion. Visualized bones unremarkable. IMPRESSION: Chronic interstitial prominence. No acute findings. Electronically Signed   By: Nicoletta Barrier M.D.   On: 05/14/2023 08:55   DG Chest Port 1 View Result Date: 05/10/2023 CLINICAL DATA:  Shortness of breath. EXAM: PORTABLE CHEST 1 VIEW COMPARISON:  Chest radiograph dated 04/26/2023. FINDINGS: Cardiomegaly with vascular congestion. No focal consolidation, pleural effusion or pneumothorax. Atherosclerotic calcification of the aortic arch. No acute osseous pathology.  IMPRESSION: Cardiomegaly with vascular congestion. Electronically Signed   By: Elgie Collard M.D.   On: 05/10/2023 17:35   US Abdomen Limited RUQ (LIVER/GB) Result Date: 04/26/2023 CLINICAL DATA:  [id number] Abdominal pain, acute, right upper quadrant [id number] EXAM: ULTRASOUND ABDOMEN LIMITED RIGHT UPPER QUADRANT COMPARISON:  CT abdomen pelvis 04/26/2023 FINDINGS: Gallbladder: Calcified stone within the gallbladder lumen. Contracted gallbladder with limited evaluation of the gallbladder wall. No  definite pericholecystic fluid. No sonographic Murphy sign noted by sonographer. Common bile duct: Diameter: 15 mm Liver: No focal lesion identified. Within normal limits in parenchymal echogenicity. Portal vein is patent on color Doppler imaging with normal direction of blood flow towards the liver. Other: None. IMPRESSION: 1. Cholelithiasis with limited evaluation of the gallbladder wall in the setting of a contracted gallbladder. 2. Common bile duct dilatation. No ultrasound evidence of choledocholithiasis within the common bile duct not fully visualized. Electronically Signed   By: Tish Frederickson M.D.   On: 04/26/2023 17:18   CT Angio Chest PE W/Cm &/Or Wo Cm Result Date: 04/26/2023 CLINICAL DATA:  Pulmonary embolism (PE) suspected, high prob; RLQ abdominal pain EXAM: CT ANGIOGRAPHY CHEST CT ABDOMEN AND PELVIS WITH CONTRAST TECHNIQUE: Multidetector CT imaging of the chest was performed using the standard protocol during bolus administration of intravenous contrast. Multiplanar CT image reconstructions and MIPs were obtained to evaluate the vascular anatomy. Multidetector CT imaging of the abdomen and pelvis was performed using the standard protocol during bolus administration of intravenous contrast. RADIATION DOSE REDUCTION: This exam was performed according to the departmental dose-optimization program which includes automated exposure control, adjustment of the mA and/or kV according to patient size and/or use of iterative reconstruction technique. CONTRAST:  75mL OMNIPAQUE IOHEXOL 350 MG/ML SOLN COMPARISON:  CT angiography chest from 02/21/2023 and CT scan abdomen and pelvis from 10/20/2021. FINDINGS: CTA CHEST FINDINGS Cardiovascular: No evidence of embolism to the proximal subsegmental pulmonary artery level. There is dilation of the main pulmonary trunk measuring up to 3.5 cm, which is nonspecific but can be seen with pulmonary artery hypertension. Mild cardiomegaly. No pericardial effusion. No aortic  aneurysm. There are coronary artery calcifications, in keeping with coronary artery disease. There are also moderate peripheral atherosclerotic vascular calcifications of thoracic aorta and its major branches. Mediastinum/Nodes: Visualized thyroid gland appears grossly unremarkable. No solid / cystic mediastinal masses. The esophagus is nondistended precluding optimal assessment. No axillary, mediastinal or hilar lymphadenopathy by size criteria. Lungs/Pleura: The central tracheo-bronchial tree is patent. There are patchy areas of linear, plate-like atelectasis and/or scarring throughout bilateral lungs. There are multiple scattered 4 mm or smaller calcified granulomas throughout bilateral lungs, nonspecific but likely sequela of prior granulomatous infection. No suspicious lung nodule. No mass or consolidation. No pleural effusion or pneumothorax. No suspicious lung nodules. Musculoskeletal: The visualized soft tissues of the chest wall are grossly unremarkable. No suspicious osseous lesions. There are mild multilevel degenerative changes in the visualized spine. Review of the MIP images confirms the above findings. CT ABDOMEN and PELVIS FINDINGS Hepatobiliary: The liver is normal in size. Noncirrhotic configuration. However, there is diffuse heterogeneity of the liver, which is nonspecific. There is a stable several simple cyst measuring 9 x 14 mm. No intrahepatic or extrahepatic bile duct dilation. Redemonstration of a large peripherally rim calcified gallstone. The gallbladder is contracted around the gallstones. There is mild-to-moderate diffuse circumferential wall thickening. No pericholecystic fat stranding. Findings are nonspecific and may represent chronic cholecystitis. However, correlate clinically to exclude superimposed acute cholecystitis. Pancreas: Redemonstration of marked atrophic  pancreas containing multiple dystrophic calcifications, suggesting sequela of chronic pancreatitis. Spleen: Within  normal limits. No focal lesion. Adrenals/Urinary Tract: Adrenal glands are unremarkable. Small/atrophic bilateral kidneys noted. There is persistent fetal lobulations and probable superimposed patchy areas of scarring throughout bilateral kidneys. No nephroureterolithiasis or obstructive uropathy. Urine bladder is distended. There is mild asymmetric bladder wall thickening without perivesical fat stranding. Findings may be due to chronic versus acute cystitis. Correlate clinically and with urinalysis. No focal mass or bladder calculi seen. Stomach/Bowel: Patient is status post right hemicolectomy with ileocolonic anastomosis in the right paramedian mid abdomen. No disproportionate dilation of the small or large bowel loops. No evidence of abnormal bowel wall thickening or inflammatory changes. Vascular/Lymphatic: No ascites or pneumoperitoneum. No abdominal or pelvic lymphadenopathy, by size criteria. Redemonstration of saccular aneurysmal dilation of infrarenal aorta measuring up to 3.9 x 4.1 cm, increased since the prior study. There is peripheral eccentric intraluminal versus intramural thrombus/hematoma. There is also a smaller saccular dilation of aorta just above the bifurcation. There are also 2 focal saccular aneurysms in the right common iliac artery with largest measuring up to 2.3 cm, grossly similar to the prior study. No other aneurysmal dilation of the major abdominal arteries. Reproductive: Post TURP defect noted. Prostate is moderately enlarged. Other: Supraumbilical midline surgical scar noted. There is small right inguinal hernia containing fat. The soft tissues and abdominal wall are otherwise unremarkable. Musculoskeletal: No suspicious osseous lesions. There are mild multilevel degenerative changes in the visualized spine. Review of the MIP images confirms the above findings. IMPRESSION: 1. No embolism to the proximal subsegmental pulmonary artery level. No lung mass, consolidation, pleural  effusion or pneumothorax. 2. 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 Vascular surgery consultation is recommended. 3. There is diffuse heterogeneity of the liver, which is nonspecific. 4. There is a large gallstone with contracted gallbladder and mild-to-moderate diffuse circumferential wall thickening. No pericholecystic fat stranding. Findings are nonspecific and may represent chronic cholecystitis. However, correlate clinically to exclude superimposed acute cholecystitis. 5. There is mild asymmetric urinary bladder wall thickening without perivesical fat stranding. Findings may be due to chronic versus acute cystitis. Correlate clinically and with urinalysis. 6. Multiple other nonacute observations, as described above. Aortic Atherosclerosis (ICD10-I70.0). Aortic aneurysm NOS (ICD10-I71.9). Electronically Signed   By: Beula Brunswick M.D.   On: 04/26/2023 15:08   CT ABDOMEN PELVIS W CONTRAST Result Date: 04/26/2023 CLINICAL DATA:  Pulmonary embolism (PE) suspected, high prob; RLQ abdominal pain EXAM: CT ANGIOGRAPHY CHEST CT ABDOMEN AND PELVIS WITH CONTRAST TECHNIQUE: Multidetector CT imaging of the chest was performed using the standard protocol during bolus administration of intravenous contrast. Multiplanar CT image reconstructions and MIPs were obtained to evaluate the vascular anatomy. Multidetector CT imaging of the abdomen and pelvis was performed using the standard protocol during bolus administration of intravenous contrast. RADIATION DOSE REDUCTION: This exam was performed according to the departmental dose-optimization program which includes automated exposure control, adjustment of the mA and/or kV according to patient size and/or use of iterative reconstruction technique. CONTRAST:  75mL OMNIPAQUE IOHEXOL 350 MG/ML SOLN COMPARISON:  CT angiography chest from 02/21/2023 and CT scan abdomen and pelvis from 10/20/2021. FINDINGS: CTA CHEST FINDINGS Cardiovascular:  No evidence of embolism to the proximal subsegmental pulmonary artery level. There is dilation of the main pulmonary trunk measuring up to 3.5 cm, which is nonspecific but can be seen with pulmonary artery hypertension. Mild cardiomegaly. No pericardial effusion. No aortic aneurysm. There are coronary artery calcifications, in  keeping with coronary artery disease. There are also moderate peripheral atherosclerotic vascular calcifications of thoracic aorta and its major branches. Mediastinum/Nodes: Visualized thyroid gland appears grossly unremarkable. No solid / cystic mediastinal masses. The esophagus is nondistended precluding optimal assessment. No axillary, mediastinal or hilar lymphadenopathy by size criteria. Lungs/Pleura: The central tracheo-bronchial tree is patent. There are patchy areas of linear, plate-like atelectasis and/or scarring throughout bilateral lungs. There are multiple scattered 4 mm or smaller calcified granulomas throughout bilateral lungs, nonspecific but likely sequela of prior granulomatous infection. No suspicious lung nodule. No mass or consolidation. No pleural effusion or pneumothorax. No suspicious lung nodules. Musculoskeletal: The visualized soft tissues of the chest wall are grossly unremarkable. No suspicious osseous lesions. There are mild multilevel degenerative changes in the visualized spine. Review of the MIP images confirms the above findings. CT ABDOMEN and PELVIS FINDINGS Hepatobiliary: The liver is normal in size. Noncirrhotic configuration. However, there is diffuse heterogeneity of the liver, which is nonspecific. There is a stable several simple cyst measuring 9 x 14 mm. No intrahepatic or extrahepatic bile duct dilation. Redemonstration of a large peripherally rim calcified gallstone. The gallbladder is contracted around the gallstones. There is mild-to-moderate diffuse circumferential wall thickening. No pericholecystic fat stranding. Findings are nonspecific and  may represent chronic cholecystitis. However, correlate clinically to exclude superimposed acute cholecystitis. Pancreas: Redemonstration of marked atrophic pancreas containing multiple dystrophic calcifications, suggesting sequela of chronic pancreatitis. Spleen: Within normal limits. No focal lesion. Adrenals/Urinary Tract: Adrenal glands are unremarkable. Small/atrophic bilateral kidneys noted. There is persistent fetal lobulations and probable superimposed patchy areas of scarring throughout bilateral kidneys. No nephroureterolithiasis or obstructive uropathy. Urine bladder is distended. There is mild asymmetric bladder wall thickening without perivesical fat stranding. Findings may be due to chronic versus acute cystitis. Correlate clinically and with urinalysis. No focal mass or bladder calculi seen. Stomach/Bowel: Patient is status post right hemicolectomy with ileocolonic anastomosis in the right paramedian mid abdomen. No disproportionate dilation of the small or large bowel loops. No evidence of abnormal bowel wall thickening or inflammatory changes. Vascular/Lymphatic: No ascites or pneumoperitoneum. No abdominal or pelvic lymphadenopathy, by size criteria. Redemonstration of saccular aneurysmal dilation of infrarenal aorta measuring up to 3.9 x 4.1 cm, increased since the prior study. There is peripheral eccentric intraluminal versus intramural thrombus/hematoma. There is also a smaller saccular dilation of aorta just above the bifurcation. There are also 2 focal saccular aneurysms in the right common iliac artery with largest measuring up to 2.3 cm, grossly similar to the prior study. No other aneurysmal dilation of the major abdominal arteries. Reproductive: Post TURP defect noted. Prostate is moderately enlarged. Other: Supraumbilical midline surgical scar noted. There is small right inguinal hernia containing fat. The soft tissues and abdominal wall are otherwise unremarkable. Musculoskeletal: No  suspicious osseous lesions. There are mild multilevel degenerative changes in the visualized spine. Review of the MIP images confirms the above findings. IMPRESSION: 1. No embolism to the proximal subsegmental pulmonary artery level. No lung mass, consolidation, pleural effusion or pneumothorax. 2. 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 Vascular surgery consultation is recommended. 3. There is diffuse heterogeneity of the liver, which is nonspecific. 4. There is a large gallstone with contracted gallbladder and mild-to-moderate diffuse circumferential wall thickening. No pericholecystic fat stranding. Findings are nonspecific and may represent chronic cholecystitis. However, correlate clinically to exclude superimposed acute cholecystitis. 5. There is mild asymmetric urinary bladder wall thickening without perivesical fat stranding. Findings may be due to chronic  versus acute cystitis. Correlate clinically and with urinalysis. 6. Multiple other nonacute observations, as described above. Aortic Atherosclerosis (ICD10-I70.0). Aortic aneurysm NOS (ICD10-I71.9). Electronically Signed   By: Beula Brunswick M.D.   On: 04/26/2023 15:08   DG Chest Port 1 View Result Date: 04/26/2023 CLINICAL DATA:  Shortness of breath. EXAM: PORTABLE CHEST 1 VIEW COMPARISON:  Chest radiograph dated 04/17/2023 FINDINGS: There is cardiomegaly with vascular congestion. No focal consolidation or pneumothorax. Small left pleural effusion suspected. Atherosclerotic calcification of the aorta. No acute osseous pathology. IMPRESSION: Cardiomegaly with vascular congestion. Electronically Signed   By: Angus Bark M.D.   On: 04/26/2023 13:48   ECHOCARDIOGRAM COMPLETE Result Date: 04/20/2023    ECHOCARDIOGRAM REPORT   Patient Name:   GAGAN DILLION [facility] Date of Exam: 04/20/2023 Medical Rec #:  [id number]           Height:       72.0 in Accession #:    [id number]          Weight:       125.8 lb Date of  Birth:  November 03, 1955           BSA:          1.750 m Patient Age:    67 years            BP:           124/78 mmHg Patient Gender: M                   HR:           64 bpm. Exam Location:  [facility] Procedure: 2D Echo, Cardiac Doppler and Color Doppler (Both Spectral and Color            Flow Doppler were utilized during procedure). Indications:    CHF  History:        Patient has prior history of Echocardiogram examinations, most                 recent 11/05/2022. Cardiomyopathy, CAD, COPD, Arrythmias:Atrial                 Fibrillation, Signs/Symptoms:Syncope; Risk Factors:Hypertension                 and Dyslipidemia. First degree heart block.  Sonographer:    Aldon Ambrosia Referring Phys: [id number] SAMUEL G MCDOWELL IMPRESSIONS  1. Left ventricular ejection fraction, by estimation, is 20 to 25%. The left ventricle has severely decreased function. The left ventricle demonstrates regional wall motion abnormalities (see scoring diagram/findings for description). The left ventricular internal cavity size was mildly to moderately dilated. There is mild concentric left ventricular hypertrophy. Left ventricular diastolic parameters are consistent with Grade II diastolic dysfunction (pseudonormalization).  2. Right ventricular systolic function is mildly reduced. The right ventricular size is normal. Tricuspid regurgitation signal is inadequate for assessing PA pressure.  3. Left atrial size was mildly dilated.  4. Right atrial size was mildly dilated.  5. The mitral valve is degenerative. Mild mitral valve regurgitation.  6. The aortic valve is tricuspid. Aortic valve regurgitation is not visualized. No aortic stenosis is present. Aortic valve mean gradient measures 3.5 mmHg.  7. The inferior vena cava is normal in size with greater than 50% respiratory variability, suggesting right atrial pressure of 3 mmHg. Comparison(s): Prior images reviewed side by side. LVEF remains severely reduced in 20-25% range. FINDINGS  Left  Ventricle: Left ventricular ejection fraction, by estimation, is 20 to 25%. The  left ventricle has severely decreased function. The left ventricle demonstrates regional wall motion abnormalities. The left ventricular internal cavity size was mildly  to moderately dilated. There is mild concentric left ventricular hypertrophy. Left ventricular diastolic parameters are consistent with Grade II diastolic dysfunction (pseudonormalization).  LV Wall Scoring: The antero-lateral wall, posterior wall, and basal inferior segment are akinetic. The entire anterior wall, entire septum, entire apex, and mid and distal inferior wall are hypokinetic. Right Ventricle: The right ventricular size is normal. No increase in right ventricular wall thickness. Right ventricular systolic function is mildly reduced. Tricuspid regurgitation signal is inadequate for assessing PA pressure. Left Atrium: Left atrial size was mildly dilated. Right Atrium: Right atrial size was mildly dilated. Pericardium: There is no evidence of pericardial effusion. Mitral Valve: The mitral valve is degenerative in appearance. There is mild thickening of the mitral valve leaflet(s). Mild mitral annular calcification. Mild mitral valve regurgitation. MV peak gradient, 2.0 mmHg. The mean mitral valve gradient is 1.0 mmHg. Tricuspid Valve: The tricuspid valve is grossly normal. Tricuspid valve regurgitation is trivial. Aortic Valve: The aortic valve is tricuspid. There is mild aortic valve annular calcification. Aortic valve regurgitation is not visualized. No aortic stenosis is present. Aortic valve mean gradient measures 3.5 mmHg. Aortic valve peak gradient measures 7.1 mmHg. Aortic valve area, by VTI measures 2.32 cm. Pulmonic Valve: The pulmonic valve was grossly normal. Pulmonic valve regurgitation is trivial. Aorta: The aortic root and ascending aorta are structurally normal, with no evidence of dilitation. Venous: The inferior vena cava is normal in size  with greater than 50% respiratory variability, suggesting right atrial pressure of 3 mmHg. IAS/Shunts: The interatrial septum was not well visualized. Additional Comments: 3D was performed not requiring image post processing on an independent workstation and was indeterminate.  LEFT VENTRICLE PLAX 2D LVIDd:         6.20 cm      Diastology LVIDs:         5.30 cm      LV e' lateral:   4.57 cm/s LV PW:         1.10 cm      LV E/e' lateral: 21.5 LV IVS:        1.10 cm LVOT diam:     2.30 cm LV SV:         56 LV SV Index:   32 LVOT Area:     4.15 cm  LV Volumes (MOD) LV vol d, MOD A2C: 218.0 ml LV vol d, MOD A4C: 293.0 ml LV vol s, MOD A2C: 158.0 ml LV vol s, MOD A4C: 237.0 ml LV SV MOD A2C:     60.0 ml LV SV MOD A4C:     293.0 ml LV SV MOD BP:      61.9 ml RIGHT VENTRICLE            IVC RV Basal diam:  5.20 cm    IVC diam: 1.10 cm RV Mid diam:    3.70 cm RV S prime:     7.40 cm/s TAPSE (M-mode): 1.7 cm LEFT ATRIUM              Index        RIGHT ATRIUM           Index LA diam:        4.80 cm  2.74 cm/m   RA Area:     21.40 cm LA Vol (A2C):   122.0 ml 69.73 ml/m  RA Volume:  60.60 ml  34.64 ml/m LA Vol (A4C):   51.8 ml  29.61 ml/m LA Biplane Vol: 87.9 ml  50.24 ml/m  AORTIC VALVE                    PULMONIC VALVE AV Area (Vmax):    2.44 cm     PV Vmax:       0.73 m/s AV Area (Vmean):   2.32 cm     PV Peak grad:  2.1 mmHg AV Area (VTI):     2.32 cm AV Vmax:           133.50 cm/s AV Vmean:          92.250 cm/s AV VTI:            0.240 m AV Peak Grad:      7.1 mmHg AV Mean Grad:      3.5 mmHg LVOT Vmax:         78.30 cm/s LVOT Vmean:        51.600 cm/s LVOT VTI:          0.134 m LVOT/AV VTI ratio: 0.56  AORTA Ao Root diam: 3.70 cm Ao Asc diam:  3.40 cm MITRAL VALVE MV Area (PHT): 3.06 cm    SHUNTS MV Area VTI:   1.94 cm    Systemic VTI:  0.13 m MV Peak grad:  2.0 mmHg    Systemic Diam: 2.30 cm MV Mean grad:  1.0 mmHg MV Vmax:       0.71 m/s MV Vmean:      42.3 cm/s MV Decel Time: 248 msec MV E velocity: 98.20  cm/s MV A velocity: 51.40 cm/s MV E/A ratio:  1.91 Nona Dell MD Electronically signed by Nona Dell MD Signature Date/Time: 04/20/2023/2:28:46 PM    Final     Microbiology: Results for orders placed or performed during the hospital encounter of 05/10/23  MRSA Next Gen by PCR, Nasal     Status: None   Collection Time: 05/09/23  9:17 PM   Specimen: Anterior Nasal Swab  Result Value Ref Range Status   MRSA by PCR Next Gen NOT DETECTED NOT DETECTED Final    Comment: (NOTE) The GeneXpert MRSA Assay (FDA approved for NASAL specimens only), is one component of a comprehensive MRSA colonization surveillance program. It is not intended to diagnose MRSA infection nor to guide or monitor treatment for MRSA infections. Test performance is not FDA approved in patients less than 36 years old. Performed at [facility], [address], [place], [place] [address]   Resp panel by RT-PCR (RSV, Flu A&B, Covid) Anterior Nasal Swab     Status: None   Collection Time: 05/10/23  9:17 PM   Specimen: Anterior Nasal Swab  Result Value Ref Range Status   SARS Coronavirus 2 by RT PCR NEGATIVE NEGATIVE Final    Comment: (NOTE) SARS-CoV-2 target nucleic acids are NOT DETECTED.  The SARS-CoV-2 RNA is generally detectable in upper respiratory specimens during the acute phase of infection. The lowest concentration of SARS-CoV-2 viral copies this assay can detect is 138 copies/mL. A negative result does not preclude SARS-Cov-2 infection and should not be used as the sole basis for treatment or other patient management decisions. A negative result may occur with  improper specimen collection/handling, submission of specimen other than nasopharyngeal swab, presence of viral mutation(s) within the areas targeted by this assay, and inadequate number of viral copies(<138 copies/mL). A negative result must be combined with clinical  observations, patient history, and epidemiological information. The expected  result is Negative.  Fact Sheet for Patients:  BloggerCourse.com  Fact Sheet for Healthcare Providers:  SeriousBroker.it  This test is no t yet approved or cleared by the [place] FDA and  has been authorized for detection and/or diagnosis of SARS-CoV-2 by FDA under an Emergency Use Authorization (EUA). This EUA will remain  in effect (meaning this test can be used) for the duration of the COVID-19 declaration under Section 564(b)(1) of the Act, 21 U.S.C.section 360bbb-3(b)(1), unless the authorization is terminated  or revoked sooner.       Influenza A by PCR NEGATIVE NEGATIVE Final   Influenza B by PCR NEGATIVE NEGATIVE Final    Comment: (NOTE) The Xpert Xpress SARS-CoV-2/FLU/RSV plus assay is intended as an aid in the diagnosis of influenza from Nasopharyngeal swab specimens and should not be used as a sole basis for treatment. Nasal washings and aspirates are unacceptable for Xpert Xpress SARS-CoV-2/FLU/RSV testing.  Fact Sheet for Patients: BloggerCourse.com  Fact Sheet for Healthcare Providers: SeriousBroker.it  This test is not yet approved or cleared by the [place] FDA and has been authorized for detection and/or diagnosis of SARS-CoV-2 by FDA under an Emergency Use Authorization (EUA). This EUA will remain in effect (meaning this test can be used) for the duration of the COVID-19 declaration under Section 564(b)(1) of the Act, 21 U.S.C. section 360bbb-3(b)(1), unless the authorization is terminated or revoked.     Resp Syncytial Virus by PCR NEGATIVE NEGATIVE Final    Comment: (NOTE) Fact Sheet for Patients: BloggerCourse.com  Fact Sheet for Healthcare Providers: SeriousBroker.it  This test is not yet approved or cleared by the [place] FDA and has been authorized for detection and/or diagnosis of  SARS-CoV-2 by FDA under an Emergency Use Authorization (EUA). This EUA will remain in effect (meaning this test can be used) for the duration of the COVID-19 declaration under Section 564(b)(1) of the Act, 21 U.S.C. section 360bbb-3(b)(1), unless the authorization is terminated or revoked.  Performed at [facility], [address], [place], [place] [address]     Labs: CBC: Recent Labs  Lab 05/10/23 1659 05/11/23 0438  WBC 7.8 3.1*  NEUTROABS 5.2  --   HGB 12.6* 12.0*  HCT 40.4 38.4*  MCV 88.4 88.7  PLT 223 206   Basic Metabolic Panel: Recent Labs  Lab 05/10/23 1539 05/11/23 0438 05/12/23 0447 05/13/23 0254 05/14/23 0312 05/15/23 0457 05/16/23 0504 05/17/23 0447  NA 138   < > 138 142 139 138 139 140  K 3.8   < > 3.2* 3.3* 3.5 3.5 3.8 3.9  CL 104   < > 107 105 105 102 105 106  CO2 23   < > 24 26 27 27 26 26   GLUCOSE 133*   < > 178* 114* 129* 141* 196* 125*  BUN 30*   < > 37* 46* 42* 45* 45* 46*  CREATININE 1.74*   < > 1.63* 1.72* 1.55* 1.53* 1.64* 1.61*  CALCIUM 9.3   < > 8.6* 8.6* 8.5* 8.7* 8.6* 8.8*  MG 2.2  --  2.2 2.0  --   --  2.2  --    < > = values in this interval not displayed.   Liver Function Tests: Recent Labs  Lab 05/10/23 1539 05/13/23 0254  AST 63* 32  ALT 63* 38  ALKPHOS 252* 184*  BILITOT 1.5* 0.7  PROT 7.4 6.5  ALBUMIN 3.6 3.0*   CBG: No results for  input(s): "GLUCAP" in the last 168 hours.  Discharge time spent: greater than 30 minutes.  Signed: Demaris Fillers, MD [facility] 05/17/2023

## 2023-05-17 NOTE — Plan of Care (Signed)
 Vitals stable. Hydralazine for hs held due to parameters. Pt tolerating diet. Requesting multiple snacks. Denies pain.  Problem: Education: Goal: Knowledge of General Education information will improve Description: Including pain rating scale, medication(s)/side effects and non-pharmacologic comfort measures Outcome: Progressing   Problem: Health Behavior/Discharge Planning: Goal: Ability to manage health-related needs will improve Outcome: Progressing   Problem: Clinical Measurements: Goal: Ability to maintain clinical measurements within normal limits will improve Outcome: Progressing Goal: Will remain free from infection Outcome: Progressing Goal: Diagnostic test results will improve Outcome: Progressing Goal: Respiratory complications will improve Outcome: Progressing Goal: Cardiovascular complication will be avoided Outcome: Progressing   Problem: Activity: Goal: Risk for activity intolerance will decrease Outcome: Progressing   Problem: Nutrition: Goal: Adequate nutrition will be maintained Outcome: Progressing   Problem: Coping: Goal: Level of anxiety will decrease Outcome: Progressing   Problem: Elimination: Goal: Will not experience complications related to bowel motility Outcome: Progressing Goal: Will not experience complications related to urinary retention Outcome: Progressing   Problem: Pain Managment: Goal: General experience of comfort will improve and/or be controlled Outcome: Progressing   Problem: Safety: Goal: Ability to remain free from injury will improve Outcome: Progressing   Problem: Skin Integrity: Goal: Risk for impaired skin integrity will decrease Outcome: Progressing

## 2023-05-18 NOTE — Transitions of Care (Post Inpatient/ED Visit) (Signed)
   05/18/2023  Name: Dayon Witt MRN: [id number] DOB: 12-Jun-1955  Today's TOC FU Call Status: Chart Review for Transitions of care.  Documentation in [employer].     Asaiah Scarber J. Gena Laski RN, MSN [facility], [facility] RN Care Manager Direct Dial: [phone]  Fax: [phone] Website: [email or web address]

## 2023-05-19 ENCOUNTER — Telehealth: Payer: Self-pay

## 2023-05-19 NOTE — Progress Notes (Signed)
 [facility] Liaison Note  05/19/2023  Bertel Venard [birth year] [id number]  Location: RN Hospital Liaison met patient at bedside at [facility].  Insurance: [employer]   Michael [name] is a 68 y.o. male who is a Primary Care Patient of [name], MD The patient was screened for 30 day readmission hospitalization with noted extreme risk score for unplanned readmission risk with 5 IP/2 ED in 6 months.  The patient was assessed for potential Care Management service needs for post hospital transition for care coordination. Review of patient's electronic medical record reveals patient was admitted for CHF. Pt active with HF program at [facility]. Pt discharged home with self care. Liaison made a referral for post hospital prevention readmission follow up calls.  Plan: [facility] Liaison will continue to follow progress and disposition to asess for post hospital community care coordination/management needs.  Referral request for community care coordination: Liaison made a referral for post hospital prevention readmission follow up calls.   [facility] Care Management/Population Health does not replace or interfere with any arrangements made by the Inpatient Transition of Care team.   For questions contact:   Lilla Reichert, RN, BSN Hospital Liaison [place]   [facility], Population Health Office Hours MTWF  8:00 am-6:00 pm Direct Dial: [phone] mobile [email or web address]

## 2023-05-19 NOTE — Transitions of Care (Post Inpatient/ED Visit) (Signed)
   05/19/2023  Name: Skylor Schnapp MRN: [id number] DOB: [birth year]  Today's TOC FU Call Status:  Chart review for transition of care.  Documentation in [employer].  Aldwin Micalizzi J. Niaja Stickley RN, MSN [facility], [facility] RN Care Manager Direct Dial: [phone]  Fax: [phone] Website: [email or web address]

## 2023-05-28 ENCOUNTER — Other Ambulatory Visit: Payer: Self-pay

## 2023-05-28 ENCOUNTER — Emergency Department (HOSPITAL_COMMUNITY)

## 2023-05-28 ENCOUNTER — Emergency Department (HOSPITAL_COMMUNITY)
Admission: EM | Admit: 2023-05-28 | Discharge: 2023-05-28 | Disposition: A | Discharge status: Home / Self Care | Attending: Emergency Medicine | Admitting: Emergency Medicine

## 2023-05-28 ENCOUNTER — Encounter (HOSPITAL_COMMUNITY): Payer: Self-pay

## 2023-05-28 DIAGNOSIS — N189 Chronic kidney disease, unspecified: Secondary | ICD-10-CM | POA: Insufficient documentation

## 2023-05-28 DIAGNOSIS — Z7901 Long term (current) use of anticoagulants: Secondary | ICD-10-CM | POA: Diagnosis not present

## 2023-05-28 DIAGNOSIS — R0789 Other chest pain: Secondary | ICD-10-CM | POA: Diagnosis not present

## 2023-05-28 DIAGNOSIS — R079 Chest pain, unspecified: Secondary | ICD-10-CM | POA: Diagnosis not present

## 2023-05-28 DIAGNOSIS — J449 Chronic obstructive pulmonary disease, unspecified: Secondary | ICD-10-CM | POA: Insufficient documentation

## 2023-05-28 DIAGNOSIS — I509 Heart failure, unspecified: Secondary | ICD-10-CM | POA: Diagnosis not present

## 2023-05-28 DIAGNOSIS — I517 Cardiomegaly: Secondary | ICD-10-CM | POA: Diagnosis not present

## 2023-05-28 LAB — COMPREHENSIVE METABOLIC PANEL WITH GFR
ALT: 40 U/L (ref 0–44)
AST: 36 U/L (ref 15–41)
Albumin: 3.4 g/dL — ABNORMAL LOW (ref 3.5–5.0)
Alkaline Phosphatase: 196 U/L — ABNORMAL HIGH (ref 38–126)
Anion gap: 12 (ref 5–15)
BUN: 32 mg/dL — ABNORMAL HIGH (ref 8–23)
CO2: 21 mmol/L — ABNORMAL LOW (ref 22–32)
Calcium: 9.1 mg/dL (ref 8.9–10.3)
Chloride: 106 mmol/L (ref 98–111)
Creatinine, Ser: 1.84 mg/dL — ABNORMAL HIGH (ref 0.61–1.24)
GFR, Estimated: 39 mL/min — ABNORMAL LOW (ref >60)
Glucose, Bld: 140 mg/dL — ABNORMAL HIGH (ref 70–99)
Potassium: 3.9 mmol/L (ref 3.5–5.1)
Sodium: 139 mmol/L (ref 135–145)
Total Bilirubin: 2.4 mg/dL — ABNORMAL HIGH (ref 0.0–1.2)
Total Protein: 7.4 g/dL (ref 6.5–8.1)

## 2023-05-28 LAB — CBC WITH DIFFERENTIAL/PLATELET
Abs Immature Granulocytes: 0.03 10*3/uL (ref 0.00–0.07)
Basophils Absolute: 0.1 10*3/uL (ref 0.0–0.1)
Basophils Relative: 1 %
Eosinophils Absolute: 0.1 10*3/uL (ref 0.0–0.5)
Eosinophils Relative: 2 %
HCT: 42 % (ref 39.0–52.0)
Hemoglobin: 13 g/dL (ref 13.0–17.0)
Immature Granulocytes: 0 %
Lymphocytes Relative: 16 %
Lymphs Abs: 1.3 10*3/uL (ref 0.7–4.0)
MCH: 27.5 pg (ref 26.0–34.0)
MCHC: 31 g/dL (ref 30.0–36.0)
MCV: 88.8 fL (ref 80.0–100.0)
Monocytes Absolute: 0.6 10*3/uL (ref 0.1–1.0)
Monocytes Relative: 7 %
Neutro Abs: 5.8 10*3/uL (ref 1.7–7.7)
Neutrophils Relative %: 74 %
Platelets: 237 10*3/uL (ref 150–400)
RBC: 4.73 MIL/uL (ref 4.22–5.81)
RDW: 16.8 % — ABNORMAL HIGH (ref 11.5–15.5)
WBC: 7.9 10*3/uL (ref 4.0–10.5)
nRBC: 0 % (ref 0.0–0.2)

## 2023-05-28 LAB — TROPONIN I (HIGH SENSITIVITY)
Troponin I (High Sensitivity): 75 ng/L — ABNORMAL HIGH (ref <18)
Troponin I (High Sensitivity): 63 ng/L — ABNORMAL HIGH (ref <18)

## 2023-05-28 LAB — BRAIN NATRIURETIC PEPTIDE: B Natriuretic Peptide: >4500 pg/mL — ABNORMAL HIGH (ref 0.0–100.0)

## 2023-05-28 MED ORDER — MORPHINE SULFATE (PF) 4 MG/ML IV SOLN
4.0000 mg | Freq: Once | INTRAVENOUS | Status: AC
  Administered 2023-05-28: 4 mg via INTRAVENOUS
  Filled 2023-05-28: qty 1

## 2023-05-28 MED ORDER — NITROGLYCERIN 2 % TD OINT
1.0000 [in_us] | TOPICAL_OINTMENT | Freq: Once | TRANSDERMAL | Status: AC
Start: 2023-05-28 — End: 2023-05-28
  Administered 2023-05-28: 1 [in_us] via TOPICAL
  Filled 2023-05-28: qty 1

## 2023-05-28 NOTE — ED Triage Notes (Signed)
 Patient brought in by a friend for complaint of Chest pain, that started at midnight, that started out dull and has gotten stronger. Pain rated 6/10.

## 2023-05-28 NOTE — ED Provider Notes (Signed)
  EMERGENCY DEPARTMENT AT [facility] Provider Note   CSN: [id number] Arrival date & time: 05/28/23  1052     History  Chief Complaint  Patient presents with   Chest Pain    Michael [name] is a 68 y.o. male.  Patient is a 68 year old male who presents to the emergency department the chief complaint of substernal chest pain which has been ongoing since last night.  He denies any associated shortness of breath, abdominal pain, nausea, vomiting, diarrhea.  He denies any associated dizziness, lightheadedness or syncope.  He has had no associated increased lower extremity pain or edema.  There has been no associated numbness, paresthesias or unilateral weakness.  He notes that does not particular seems to make the pain better or worse.  He notes that the pain has progressed since last night.  Patient was recently discharged from the hospital for CHF exacerbation.   Chest Pain      Home Medications Prior to Admission medications   Medication Sig Start Date End Date Taking? Authorizing Provider  apixaban  (ELIQUIS ) 2.5 MG TABS tablet Take 1 tablet (2.5 mg total) by mouth 2 (two) times daily. 04/21/23   Mason Sole, Pratik D, DO  atorvastatin  (LIPITOR ) 40 MG tablet Take 1 tablet (40 mg total) by mouth every evening. Patient taking differently: Take 40 mg by mouth daily. 08/01/22 02/05/24  Bobbetta Burnet, MD  Budeson-Glycopyrrol-Formoterol  (BREZTRI  AEROSPHERE) 160-9-4.8 MCG/ACT AERO Inhale 2 puffs into the lungs 2 (two) times daily.    [provider]  carvedilol  (COREG ) 12.5 MG tablet Take 1 tablet (12.5 mg total) by mouth 2 (two) times daily with a meal. 05/17/23   Tat, Myrtie Atkinson, MD  diltiazem  (CARDIZEM ) 30 MG tablet Take 30 mg by mouth daily as needed (heart rate >110 BPM).    [provider]  losartan  (COZAAR ) 25 MG tablet Take 1 tablet (25 mg total) by mouth daily. 05/17/23   Demaris Fillers, MD  nitroGLYCERIN  (NITROSTAT ) 0.4 MG SL tablet Place 1 tablet (0.4 mg  total) under the tongue every 5 (five) minutes as needed for chest pain. 02/05/23   Merdis Stalling, MD  potassium chloride  SA (KLOR-CON  M) 20 MEQ tablet Take 1 tablet (20 mEq total) by mouth daily. 07/26/22 02/05/24  Bobbetta Burnet, MD  torsemide  (DEMADEX ) 20 MG tablet Take 20 mg by mouth 2 (two) times daily.    [provider]  Vericiguat  (VERQUVO ) 2.5 MG TABS Take 1 tablet (2.5 mg total) by mouth daily. 04/27/23   Strader, Dimple Francis, PA-C      Allergies    Entresto  [sacubitril -valsartan ] and Sglt2 inhibitors    Review of Systems   Review of Systems  Cardiovascular:  Positive for chest pain.  All other systems reviewed and are negative.   Physical Exam Updated Vital Signs BP (!) 183/134   Pulse 96   Temp (!) 97.5 F (36.4 C) (Oral)   Resp (!) 33   Ht 6' (1.829 m)   Wt 64.9 kg   SpO2 98%   BMI 19.39 kg/m  Physical Exam Vitals and nursing note reviewed.  Constitutional:      Appearance: Normal appearance.  HENT:     Head: Normocephalic and atraumatic.     Nose: Nose normal.     Mouth/Throat:     Mouth: Mucous membranes are moist.  Eyes:     Extraocular Movements: Extraocular movements intact.     Conjunctiva/sclera: Conjunctivae normal.     Pupils: Pupils are equal, round,  and reactive to light.  Cardiovascular:     Rate and Rhythm: Normal rate and regular rhythm.     Pulses: Normal pulses.     Heart sounds: Normal heart sounds. Heart sounds not distant. No murmur heard. Pulmonary:     Effort: Pulmonary effort is normal. No tachypnea.     Breath sounds: Normal breath sounds. No decreased breath sounds, wheezing, rhonchi or rales.  Chest:     Chest wall: No tenderness.  Abdominal:     General: Abdomen is flat. Bowel sounds are normal.     Palpations: Abdomen is soft. There is no mass.     Tenderness: There is no abdominal tenderness. There is no guarding.  Musculoskeletal:        General: Normal range of motion.     Cervical back: Normal range of motion  and neck supple.  Skin:    General: Skin is warm and dry.  Neurological:     General: No focal deficit present.     Mental Status: He is alert and oriented to person, place, and time. Mental status is at baseline.  Psychiatric:        Mood and Affect: Mood normal.        Behavior: Behavior normal.        Thought Content: Thought content normal.        Judgment: Judgment normal.     ED Results / Procedures / Treatments   Labs (all labs ordered are listed, but only abnormal results are displayed) Labs Reviewed  CBC WITH DIFFERENTIAL/PLATELET - Abnormal; Notable for the following components:      Result Value   RDW 16.8 (*)    All other components within normal limits  COMPREHENSIVE METABOLIC PANEL WITH GFR  BRAIN NATRIURETIC PEPTIDE  TROPONIN I (HIGH SENSITIVITY)    EKG EKG Interpretation Date/Time:  Sunday May 28 2023 11:04:10 EDT Ventricular Rate:  87 PR Interval:  247 QRS Duration:  132 QT Interval:  399 QTC Calculation: 480 R Axis:   -38  Text Interpretation: Sinus or ectopic atrial rhythm Atrial premature complexes Prolonged PR interval Left bundle branch block Baseline wander in lead(s) V1 V3 Confirmed by Shyrl Doyne [phone]) on 05/28/2023 11:20:51 AM  Radiology No results found.  Procedures Procedures    Medications Ordered in ED Medications  morphine  (PF) 4 MG/ML injection 4 mg (has no administration in time range)  nitroGLYCERIN  (NITROGLYN) 2 % ointment 1 inch (has no administration in time range)    ED Course/ Medical Decision Making/ A&P                                 Medical Decision Making Amount and/or Complexity of Data Reviewed Labs: ordered. Radiology: ordered.  Risk Prescription drug management.   This patient presents to the ED for concern of chest pain differential diagnosis includes pericarditis, myocarditis, ACS, pneumonia, pneumothorax, hemothorax, acute CHF, pulmonary embolus, chest wall pain    Additional history  obtained:  Additional history obtained from medical records External records from outside source obtained and reviewed including medical records   Lab Tests:  I Ordered, and personally interpreted labs.  The pertinent results include: Elevated BNP, elevated troponin but flat and at baseline, creatinine at baseline, no electrolyte changes, liver function at baseline, no anemia or leukocytosis   Imaging Studies ordered:  I ordered imaging studies including chest x-ray I independently visualized and interpreted imaging which showed cardiomegaly otherwise  no acute cardiopulmonary process, no fluid overload I agree with the radiologist interpretation   Medicines ordered and prescription drug management:  I ordered medication including morphine , nitro for chest pain Reevaluation of the patient after these medicines showed that the patient improved I have reviewed the patients home medicines and have made adjustments as needed   Problem List / ED Course:  Patient is doing better at this time and is stable for discharge home.  Discussed with patient that all workup in the emergency department has been unremarkable.  Troponins are at his baseline and flat.  EKG demonstrates no acute ischemic changes and is consistent with previous.  Patient has no signs of acute CHF at this point.  Do not suspect peritonitis or myocarditis as symptoms are nonpositional in nature.  Do not suspect aortic aneurysm or dissection.  Chest x-ray demonstrated no indication for pneumonia, pneumothorax, hemothorax.  Patient was directed follow-up closely with primary care doctor on outpatient basis.  Strict turn precautions were discussed for any new or worsening symptoms.  Patient voiced understanding to the plan and had no additional questions. Patient was fully evaluated by attending physician who is in agreement to plan at this time.    Social Determinants of Health:  None           Final Clinical  Impression(s) / ED Diagnoses Final diagnoses:  None    Rx / DC Orders ED Discharge Orders     None         Emmalene Hare 05/28/23 1441    Ninetta Basket, MD 05/31/23 [phone]

## 2023-05-28 NOTE — Discharge Instructions (Signed)
 Please follow-up closely with your primary care doctor and cardiologist on outpatient basis.  Return to emergency department immediately for any new or worsening symptoms.

## 2023-05-31 ENCOUNTER — Encounter: Payer: Self-pay | Admitting: Physician Assistant

## 2023-05-31 ENCOUNTER — Other Ambulatory Visit: Payer: Self-pay

## 2023-05-31 ENCOUNTER — Emergency Department (HOSPITAL_COMMUNITY)

## 2023-05-31 ENCOUNTER — Emergency Department (HOSPITAL_COMMUNITY)
Admission: EM | Admit: 2023-05-31 | Discharge: 2023-05-31 | Disposition: A | Discharge status: Home / Self Care | Attending: Emergency Medicine | Admitting: Emergency Medicine

## 2023-05-31 ENCOUNTER — Encounter (HOSPITAL_COMMUNITY): Payer: Self-pay

## 2023-05-31 DIAGNOSIS — J449 Chronic obstructive pulmonary disease, unspecified: Secondary | ICD-10-CM | POA: Diagnosis not present

## 2023-05-31 DIAGNOSIS — N189 Chronic kidney disease, unspecified: Secondary | ICD-10-CM | POA: Diagnosis not present

## 2023-05-31 DIAGNOSIS — K861 Other chronic pancreatitis: Secondary | ICD-10-CM | POA: Diagnosis not present

## 2023-05-31 DIAGNOSIS — I517 Cardiomegaly: Secondary | ICD-10-CM | POA: Diagnosis not present

## 2023-05-31 DIAGNOSIS — I509 Heart failure, unspecified: Secondary | ICD-10-CM | POA: Diagnosis not present

## 2023-05-31 DIAGNOSIS — Z79899 Other long term (current) drug therapy: Secondary | ICD-10-CM | POA: Insufficient documentation

## 2023-05-31 DIAGNOSIS — Z7901 Long term (current) use of anticoagulants: Secondary | ICD-10-CM | POA: Insufficient documentation

## 2023-05-31 DIAGNOSIS — K828 Other specified diseases of gallbladder: Secondary | ICD-10-CM | POA: Diagnosis not present

## 2023-05-31 DIAGNOSIS — I7143 Infrarenal abdominal aortic aneurysm, without rupture: Secondary | ICD-10-CM | POA: Diagnosis not present

## 2023-05-31 DIAGNOSIS — I739 Peripheral vascular disease, unspecified: Secondary | ICD-10-CM | POA: Diagnosis not present

## 2023-05-31 DIAGNOSIS — I130 Hypertensive heart and chronic kidney disease with heart failure and stage 1 through stage 4 chronic kidney disease, or unspecified chronic kidney disease: Secondary | ICD-10-CM | POA: Insufficient documentation

## 2023-05-31 DIAGNOSIS — I2510 Atherosclerotic heart disease of native coronary artery without angina pectoris: Secondary | ICD-10-CM | POA: Insufficient documentation

## 2023-05-31 DIAGNOSIS — R0989 Other specified symptoms and signs involving the circulatory and respiratory systems: Secondary | ICD-10-CM | POA: Diagnosis not present

## 2023-05-31 DIAGNOSIS — R0789 Other chest pain: Secondary | ICD-10-CM | POA: Diagnosis not present

## 2023-05-31 DIAGNOSIS — K8020 Calculus of gallbladder without cholecystitis without obstruction: Secondary | ICD-10-CM | POA: Insufficient documentation

## 2023-05-31 DIAGNOSIS — Z85828 Personal history of other malignant neoplasm of skin: Secondary | ICD-10-CM | POA: Insufficient documentation

## 2023-05-31 DIAGNOSIS — R109 Unspecified abdominal pain: Secondary | ICD-10-CM

## 2023-05-31 DIAGNOSIS — R1011 Right upper quadrant pain: Secondary | ICD-10-CM | POA: Diagnosis not present

## 2023-05-31 LAB — COMPREHENSIVE METABOLIC PANEL WITH GFR
ALT: 30 U/L (ref 0–44)
AST: 25 U/L (ref 15–41)
Albumin: 3.4 g/dL — ABNORMAL LOW (ref 3.5–5.0)
Alkaline Phosphatase: 169 U/L — ABNORMAL HIGH (ref 38–126)
Anion gap: 10 (ref 5–15)
BUN: 22 mg/dL (ref 8–23)
CO2: 23 mmol/L (ref 22–32)
Calcium: 9.2 mg/dL (ref 8.9–10.3)
Chloride: 108 mmol/L (ref 98–111)
Creatinine, Ser: 1.63 mg/dL — ABNORMAL HIGH (ref 0.61–1.24)
GFR, Estimated: 46 mL/min — ABNORMAL LOW (ref >60)
Glucose, Bld: 130 mg/dL — ABNORMAL HIGH (ref 70–99)
Potassium: 3.5 mmol/L (ref 3.5–5.1)
Sodium: 141 mmol/L (ref 135–145)
Total Bilirubin: 1.6 mg/dL — ABNORMAL HIGH (ref 0.0–1.2)
Total Protein: 7 g/dL (ref 6.5–8.1)

## 2023-05-31 LAB — CBC WITH DIFFERENTIAL/PLATELET
Abs Immature Granulocytes: 0.02 10*3/uL (ref 0.00–0.07)
Basophils Absolute: 0.1 10*3/uL (ref 0.0–0.1)
Basophils Relative: 1 %
Eosinophils Absolute: 0.2 10*3/uL (ref 0.0–0.5)
Eosinophils Relative: 2 %
HCT: 39.7 % (ref 39.0–52.0)
Hemoglobin: 12.2 g/dL — ABNORMAL LOW (ref 13.0–17.0)
Immature Granulocytes: 0 %
Lymphocytes Relative: 15 %
Lymphs Abs: 1 10*3/uL (ref 0.7–4.0)
MCH: 27.9 pg (ref 26.0–34.0)
MCHC: 30.7 g/dL (ref 30.0–36.0)
MCV: 90.8 fL (ref 80.0–100.0)
Monocytes Absolute: 0.5 10*3/uL (ref 0.1–1.0)
Monocytes Relative: 8 %
Neutro Abs: 4.8 10*3/uL (ref 1.7–7.7)
Neutrophils Relative %: 74 %
Platelets: 244 10*3/uL (ref 150–400)
RBC: 4.37 MIL/uL (ref 4.22–5.81)
RDW: 17.5 % — ABNORMAL HIGH (ref 11.5–15.5)
WBC: 6.5 10*3/uL (ref 4.0–10.5)
nRBC: 0 % (ref 0.0–0.2)

## 2023-05-31 LAB — URINALYSIS, ROUTINE W REFLEX MICROSCOPIC
Bacteria, UA: NONE SEEN
Bilirubin Urine: NEGATIVE
Glucose, UA: NEGATIVE mg/dL
Hgb urine dipstick: NEGATIVE
Ketones, ur: NEGATIVE mg/dL
Nitrite: NEGATIVE
Protein, ur: 100 mg/dL — AB
Specific Gravity, Urine: 1.018 (ref 1.005–1.030)
pH: 5 (ref 5.0–8.0)

## 2023-05-31 LAB — TROPONIN I (HIGH SENSITIVITY): Troponin I (High Sensitivity): 67 ng/L — ABNORMAL HIGH (ref <18)

## 2023-05-31 LAB — LIPASE, BLOOD: Lipase: 27 U/L (ref 11–51)

## 2023-05-31 MED ORDER — FENTANYL CITRATE PF 50 MCG/ML IJ SOSY
25.0000 ug | PREFILLED_SYRINGE | Freq: Once | INTRAMUSCULAR | Status: AC
  Administered 2023-05-31: 25 ug via INTRAVENOUS
  Filled 2023-05-31: qty 1

## 2023-05-31 MED ORDER — ONDANSETRON HCL 4 MG/2ML IJ SOLN
4.0000 mg | Freq: Once | INTRAMUSCULAR | Status: AC
  Administered 2023-05-31: 4 mg via INTRAVENOUS
  Filled 2023-05-31: qty 2

## 2023-05-31 MED ORDER — IOHEXOL 300 MG/ML  SOLN
80.0000 mL | Freq: Once | INTRAMUSCULAR | Status: AC | PRN
  Administered 2023-05-31: 80 mL via INTRAVENOUS

## 2023-05-31 MED ORDER — DICYCLOMINE HCL 20 MG PO TABS: 20.0000 mg | ORAL_TABLET | Freq: Two times a day (BID) | ORAL | 0 refills | Status: DC

## 2023-05-31 NOTE — Discharge Instructions (Addendum)
 You have a gall stone, as you discussed, the surgeon did not want to operate. You can continue following up with you PCP.  You may need outpatient HIDA scan for your gallbladder. You can take the bentyl as needed for discomfort.  You CT scan also shows some blockages in your arteries of your leg and an aneurysm that is stable from your last CT scan, follow up with vascular surgery. Come back if you have severe right leg pain or color change.

## 2023-05-31 NOTE — ED Notes (Signed)
 See triage notes. Pt denies any genital swelling or trouble urinating. nad

## 2023-05-31 NOTE — ED Provider Notes (Signed)
 [place] EMERGENCY DEPARTMENT AT [facility] Provider Note   CSN: [id number] Arrival date & time: 05/31/23  1056     History  Chief Complaint  Patient presents with   Flank Pain    Michael [name] is a 68 y.o. male.he has a PMH significant for head/neck cancer (Right facial basal cell carcinoma s/p resection with right partial mastectomy and partal rhinectomy with skin graft (06/13/17), anemia, htn, copd, ckd, cad, afib (on eliquis ), and cardiomyopathy.   The ER today with right chest sternal quadrant abdominal pain that started last night around midnight while he was laying in bed, states he had not eaten thing prior to this.  States he believes his gallbladder has been dealing with this pain on and off for about a month, was seen in the ER with this, part of workup was CT abdomen pelvis and right upper quadrant ultrasound that showed a gallstone.  He has been following up with his PCP he reports, and she had him follow-up with surgeon, he states he has seen 4 surgeons all who said he has too many medical issues to remove his gallbladder.  The pain is on and off.  He has not taken any medication for his pain.   Flank Pain       Home Medications Prior to Admission medications   Medication Sig Start Date End Date Taking? Authorizing Provider  dicyclomine  (BENTYL ) 20 MG tablet Take 1 tablet (20 mg total) by mouth 2 (two) times daily. 05/31/23  Yes Loreto Loescher A, PA-C  apixaban  (ELIQUIS ) 2.5 MG TABS tablet Take 1 tablet (2.5 mg total) by mouth 2 (two) times daily. 04/21/23   Mason Sole, Pratik D, DO  atorvastatin  (LIPITOR ) 40 MG tablet Take 1 tablet (40 mg total) by mouth every evening. Patient taking differently: Take 40 mg by mouth daily. 08/01/22 02/05/24  ShahmehdiConstantino Demark, MD  Budeson-Glycopyrrol-Formoterol  (BREZTRI  AEROSPHERE) 160-9-4.8 MCG/ACT AERO Inhale 2 puffs into the lungs 2 (two) times daily.    [provider]  carvedilol  (COREG ) 12.5 MG tablet Take 1  tablet (12.5 mg total) by mouth 2 (two) times daily with a meal. 05/17/23   Tat, Myrtie Atkinson, MD  diltiazem  (CARDIZEM ) 30 MG tablet Take 30 mg by mouth daily as needed (heart rate >110 BPM).    [provider]  losartan  (COZAAR ) 25 MG tablet Take 1 tablet (25 mg total) by mouth daily. 05/17/23   Demaris Fillers, MD  nitroGLYCERIN  (NITROSTAT ) 0.4 MG SL tablet Place 1 tablet (0.4 mg total) under the tongue every 5 (five) minutes as needed for chest pain. 02/05/23   Merdis Stalling, MD  potassium chloride  SA (KLOR-CON  M) 20 MEQ tablet Take 1 tablet (20 mEq total) by mouth daily. 07/26/22 02/05/24  Bobbetta Burnet, MD  torsemide  (DEMADEX ) 20 MG tablet Take 20 mg by mouth 2 (two) times daily.    [provider]  Vericiguat  (VERQUVO ) 2.5 MG TABS Take 1 tablet (2.5 mg total) by mouth daily. 04/27/23   Strader, Dimple Francis, PA-C      Allergies    Entresto  [sacubitril -valsartan ] and Sglt2 inhibitors    Review of Systems   Review of Systems  Genitourinary:  Positive for flank pain.    Physical Exam Updated Vital Signs BP (!) 178/106   Pulse (!) 172   Temp 97.9 F (36.6 C) (Oral)   Resp (!) 24   Ht 6' (1.829 m)   Wt 64.9 kg   SpO2 98%   BMI 19.39 kg/m  Physical Exam Vitals and nursing note reviewed.  Constitutional:      General: He is not in acute distress.    Appearance: He is well-developed.  HENT:     Head: Normocephalic and atraumatic.  Eyes:     Conjunctiva/sclera: Conjunctivae normal.  Cardiovascular:     Rate and Rhythm: Normal rate and regular rhythm.     Heart sounds: No murmur heard. Pulmonary:     Effort: Pulmonary effort is normal. No respiratory distress.     Breath sounds: Normal breath sounds.  Abdominal:     Palpations: Abdomen is soft.     Tenderness: There is abdominal tenderness in the right upper quadrant. There is no right CVA tenderness, left CVA tenderness, guarding or rebound. Negative signs include Murphy's sign, Rovsing's sign and McBurney's sign.   Musculoskeletal:        General: No swelling.     Cervical back: Neck supple.  Skin:    General: Skin is warm and dry.     Capillary Refill: Capillary refill takes less than 2 seconds.  Neurological:     Mental Status: He is alert.  Psychiatric:        Mood and Affect: Mood normal.     ED Results / Procedures / Treatments   Labs (all labs ordered are listed, but only abnormal results are displayed) Labs Reviewed  URINALYSIS, ROUTINE W REFLEX MICROSCOPIC - Abnormal; Notable for the following components:      Result Value   Protein, ur 100 (*)    Leukocytes,Ua TRACE (*)    All other components within normal limits  COMPREHENSIVE METABOLIC PANEL WITH GFR - Abnormal; Notable for the following components:   Glucose, Bld 130 (*)    Creatinine, Ser 1.63 (*)    Albumin  3.4 (*)    Alkaline Phosphatase 169 (*)    Total Bilirubin 1.6 (*)    GFR, Estimated 46 (*)    All other components within normal limits  CBC WITH DIFFERENTIAL/PLATELET - Abnormal; Notable for the following components:   Hemoglobin 12.2 (*)    RDW 17.5 (*)    All other components within normal limits  TROPONIN I (HIGH SENSITIVITY) - Abnormal; Notable for the following components:   Troponin I (High Sensitivity) 67 (*)    All other components within normal limits  URINE CULTURE  LIPASE, BLOOD    EKG EKG Interpretation Date/Time:  Wednesday May 31 2023 12:21:47 EDT Ventricular Rate:  81 PR Interval:  246 QRS Duration:  134 QT Interval:  430 QTC Calculation: 500 R Axis:   108  Text Interpretation: Sinus or ectopic atrial rhythm Multiform ventricular premature complexes Prolonged PR interval Nonspecific intraventricular conduction delay Anteroseptal infarct, old Repol abnrm suggests ischemia, lateral leads Confirmed by Shyrl Doyne [phone]) on 05/31/2023 1:44:08 PM  Radiology CT ABDOMEN PELVIS W CONTRAST Result Date: 05/31/2023 CLINICAL DATA:  Abdominal pain, acute, nonlocalized abnormal us  EXAM: CT  ABDOMEN AND PELVIS WITH CONTRAST TECHNIQUE: Multidetector CT imaging of the abdomen and pelvis was performed using the standard protocol following bolus administration of intravenous contrast. RADIATION DOSE REDUCTION: This exam was performed according to the departmental dose-optimization program which includes automated exposure control, adjustment of the mA and/or kV according to patient size and/or use of iterative reconstruction technique. CONTRAST:  80mL OMNIPAQUE  IOHEXOL  300 MG/ML  SOLN COMPARISON:  April 26, 2023 FINDINGS: Lower chest: No focal airspace consolidation or pleural effusion.Moderate cardiomegaly. Multi-vessel coronary atherosclerosis. Posterior bibasilar dependent atelectasis. Hepatobiliary: No mass. A couple of small cysts  again noted in the liver. Decompressed gallbladder containing a single large radiopaque stone. Fairly diffuse wall thickening throughout the decompressed gallbladder with subtle induration of the pericholecystic fat. No intrahepatic or extrahepatic biliary ductal dilation. Mild diffuse periportal edema. The portal veins are patent. Pancreas: Severe parenchymal atrophy with innumerable calcifications throughout the parenchyma. Diffuse pancreatic ductal dilation. Constellation of findings are consistent with chronic calcific pancreatitis. No peripancreatic inflammation or fluid collection. Spleen: Mild splenomegaly.  No mass. Adrenals/Urinary Tract: No adrenal masses. Significant atrophy and patchy scarring within both kidneys. No nephrolithiasis or hydronephrosis. Circumferential wall thickening of the urinary bladder. Stomach/Bowel: The stomach is decompressed without focal abnormality. No small bowel wall thickening or inflammation. No small bowel obstruction. Right hemicolectomy with ileocolic anastomosis in the mid abdomen. Vascular/Lymphatic: Tortuous abdominal aorta with and unchanged fusiform infrarenal aortic aneurysm measuring 4.1 cm with extensive mural thrombus  throughout. There is mild diffuse narrowing throughout the aneurysm sac from the mural thrombus. Extensive calcified noncalcified plaque is also noted throughout the aortoiliac system with moderate stenosis of the most superior aspect of the infrarenal aorta just below the renal artery ostia (axial 23). Severe narrowing of the left common iliac artery ostium from extensive calcified plaque. 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 Just downstream, there is a thrombosed pseudoaneurysm along the mid right common iliac artery (axial 44), measuring 1.6 cm. Multifocal mild-to-moderate stenoses throughout the inflow vessels and partially visualized upper thigh arteries. Focal severe to critical stenosis in the proximal right SFA (axial 92), incompletely evaluated. No intraabdominal or pelvic lymphadenopathy. Reproductive: Moderate prostatomegaly with postsurgical changes of a prior TURP procedure. No free pelvic fluid. Other: No pneumoperitoneum, ascites, or mesenteric inflammation. Musculoskeletal: No acute fracture or destructive lesion. Diffuse anasarca. Osteopenia. Multilevel degenerative disc disease of the spine. Mild bilateral hip osteoarthritis. IMPRESSION: 1. Decompressed gallbladder containing a single large radiopaque gallstone. Circumferential wall thickening of the gallbladder with subtle induration of the pericholecystic fat, which may reflect changes of acute or chronic cholecystitis. Alternatively, the pericholecystic inflammation could be related to acute pancreatitis. Correlation with serum lipase and a nonemergent nuclear medicine hepatobiliary scan is recommended for further characterization. 2. Focal severe to critical stenosis of the proximal right SFA, partially visualized (axial 92). There is also a focal severe stenosis in the profundus femoral artery on the right (axial 82). Clinical correlation and consultation with vascular surgery recommended.  3. Circumferential wall thickening of the urinary bladder, possibly due to chronic bladder outlet obstruction. Correlation with urinalysis recommended to exclude acute cystitis. 4. Moderate prostatomegaly with postsurgical changes of prior TURP procedure. 5. Significant changes of chronic calcific pancreatitis. 6. Unchanged fusiform aneurysm of the infrarenal aorta measuring 4.1 cm. Electronically Signed   By: Rance Burrows M.D.   On: 05/31/2023 16:33   US  Abdomen Limited Result Date: 05/31/2023 CLINICAL DATA:  Right upper quadrant pain EXAM: ULTRASOUND ABDOMEN LIMITED RIGHT UPPER QUADRANT COMPARISON:  Ultrasound and CT 04/26/2023 FINDINGS: Gallbladder: Large stone in the gallbladder. There is gallbladder wall thickening and portion of the gallbladder obscured by overlapping bowel gas and soft tissue. Please correlate with prior CT. Common bile duct: Diameter: 11 mm Liver: Echogenic hepatic parenchyma. Simple appearing cyst measuring 10 mm. Anechoic with through transmission. Portal vein is patent on color Doppler imaging with normal direction of blood flow towards the liver. Other: None. IMPRESSION: Abnormal gallbladder with thickening and large stone. Recommend further evaluation. Please correlate with prior CT scan. Prominent common duct. Similar to prior exam. Again  further evaluation is recommended when appropriate Electronically Signed   By: Adrianna Horde M.D.   On: 05/31/2023 14:20   DG Chest Portable 1 View Result Date: 05/31/2023 CLINICAL DATA:  Right flank pain. EXAM: PORTABLE CHEST 1 VIEW COMPARISON:  May 28, 2023. FINDINGS: Stable cardiomegaly. Mild central pulmonary vascular congestion is noted. Stable interstitial densities are noted throughout both lungs which may represent scarring. No definite acute pulmonary abnormality is noted. Bony thorax is unremarkable. IMPRESSION: Stable cardiomegaly with mild central pulmonary vascular congestion. Electronically Signed   By: Rosalene Colon M.D.    On: 05/31/2023 12:21    Procedures Procedures    Medications Ordered in ED Medications  fentaNYL  (SUBLIMAZE ) injection 25 mcg (25 mcg Intravenous Given 05/31/23 1225)  ondansetron  (ZOFRAN ) injection 4 mg (4 mg Intravenous Given 05/31/23 1225)  iohexol  (OMNIPAQUE ) 300 MG/ML solution 80 mL (80 mLs Intravenous Contrast Given 05/31/23 1601)    ED Course/ Medical Decision Making/ A&P                                 Medical Decision Making This patient presents to the ED for concern of right upper quadrant pain, right-sided chest pain, this involves an extensive number of treatment options, and is a complaint that carries with it a high risk of complications and morbidity.  The differential diagnosis includes ACS, PE, pneumonia, cholecystitis, cholelithiasis, other   Co morbidities that complicate the patient evaluation  High blood pressure, high cholesterol, A-fib on Eliquis , neck cancer, COPD, CHF   Additional history obtained:  Additional history obtained from EMR External records from outside source obtained and reviewed including prior notes labs and imaging   Lab Tests:  I Ordered, and personally interpreted labs.  The pertinent results include:    CBC-unremarkable CMP-baseline CKD, bilirubin 1.6 down from previous of 2.4 UA-trace leukocytes with 21-50 white blood cells, no red blood cells, no bacteria negative nitrite Troponin-at baseline Lipase-normal  Imaging Studies ordered:  I ordered imaging studies including x-ray I independently visualized and interpreted imaging which showed to report cardiomegaly with mild vascular congestion I agree with the radiologist interpretation     Problem List / ED Course / Critical interventions / Medication management  Right flank pain-patient has right lower quadrant tenderness, has been ongoing for over a month, states it feels the same.  Labs are stable from previous.  Pain does not sound cardiac and his troponin is at his  baseline do not feel he needs further cardiac workup.  I did ultrasound his gallbladder to rule out cholecystitis since he has an right upper quadrant tenderness and they saw some partially visualized gallbladder wall thickening so CT was ordered.  CT abdomen pelvis with contrast shows a decompressed gallbladder with a single large gallstone with circumferential wall thickening of the gallbladder and somewhat subtle induration of pericholecystic fat.  Labs are normal including no leukocytosis, no fevers, he does not have a positive Murphy sign.  Patient was also examined by my attending, do not feel he needs antibiotics or admission, he is already seen surgeons who do not feel a surgical candidate.  Likely chronic changes.  Will give Bentyl  and have him follow close with PCP given strict return precautions.  CT abdomen foot was also showed focal severe to critical stenosis of the right proximal SFA that was partially visualized and focal severe stenosis of profundus femoral artery on the right-patient has dopplerable pulses, warm  foot and no leg pain.  Advised of this finding and need to follow-up with vascular surgery  He also has unchanged fusiform aneurysm of infrarenal aorta-patient again advised follow-up with vascular surgery to follow this.  No concern at this time for rupture or dissection. Patient did have some bladder wall thickening he is not having dysuria or frequency, he has some white blood cells but no bacteria in his urine, sent for culture but do not feel antibiotics are indicated at this time. I have reviewed the patients home medicines and have made adjustments as needed     Amount and/or Complexity of Data Reviewed External Data Reviewed: labs and notes. Labs: ordered. Decision-making details documented in ED Course. Radiology: ordered and independent interpretation performed. Decision-making details documented in ED Course.  Risk Prescription drug  management.           Final Clinical Impression(s) / ED Diagnoses Final diagnoses:  Right flank pain  Peripheral arterial disease (HCC)  Calculus of gallbladder without cholecystitis without obstruction    Rx / DC Orders ED Discharge Orders          Ordered    dicyclomine  (BENTYL ) 20 MG tablet  2 times daily        05/31/23 1737              Joshua Nieves 05/31/23 1738    Ninetta Basket, MD 06/05/23 [phone]

## 2023-05-31 NOTE — ED Notes (Signed)
 Pt in ct

## 2023-05-31 NOTE — ED Triage Notes (Signed)
 Pt arrived via POV c/o right flank pain that began yesterday. Pt reports pain radiates up and down his right side. Pt reports it hurts sometimes when he urinates.

## 2023-06-02 LAB — URINE CULTURE: Culture: <10000 — AB

## 2023-06-05 ENCOUNTER — Encounter: Payer: Self-pay | Admitting: Internal Medicine

## 2023-06-05 DIAGNOSIS — C44391 Other specified malignant neoplasm of skin of nose: Secondary | ICD-10-CM | POA: Diagnosis not present

## 2023-06-06 ENCOUNTER — Ambulatory Visit: Admitting: Vascular Surgery

## 2023-06-06 ENCOUNTER — Encounter: Payer: Self-pay | Admitting: Vascular Surgery

## 2023-06-06 VITALS — BP 151/110 | HR 67 | Temp 98.0°F | Resp 24 | Ht 72.0 in | Wt 146.6 lb

## 2023-06-06 DIAGNOSIS — I7143 Infrarenal abdominal aortic aneurysm, without rupture: Secondary | ICD-10-CM | POA: Diagnosis not present

## 2023-06-06 DIAGNOSIS — I6521 Occlusion and stenosis of right carotid artery: Secondary | ICD-10-CM

## 2023-06-06 DIAGNOSIS — I714 Abdominal aortic aneurysm, without rupture, unspecified: Secondary | ICD-10-CM | POA: Insufficient documentation

## 2023-06-06 NOTE — Progress Notes (Signed)
 Patient name: Michael [name] MRN: [id number] DOB: 02/22/1955 Sex: male  REASON FOR CONSULT: 3.9 cm AAA and 2.3 cm right common iliac aneurysm  HPI: Michael [name] is a 68 y.o. male, with history of A-fib, CAD, CKD 3, COPD, head and neck cancer, hypertension that presents for evaluation of a 3.9 cm AAA and 2.3 cm right common iliac aneurysm.  This was discovered incidentally on a CT from 04/26/2023. Apparently had some abdominal pain as well prompting CT in the ED.    Most recently had a CT abdomen pelvis on 05/31/2023 showing a 4.1 cm fusiform infrarenal abdominal aortic aneurysm with right common iliac artery pseudoaneurysm measuring 13 mm and a second 1.6 cm mid right common iliac pseudoaneurysm.  Previous right TCAR with 02/15/21 for asymptomatic high stenosis.  Denies any recent stroke or TIA symptoms.  Past Medical History:  Diagnosis Date   Aortic atherosclerosis (HCC) 08/03/2021   Atrial fibrillation (HCC)    CAD (coronary artery disease) 08/03/2021   Cardiac catheterization September 2023 with RCA/RV marginal stenosis managed medically   Cardiomyopathy (HCC)    Carotid artery disease (HCC)    CKD (chronic kidney disease) stage 3, GFR 30-59 ml/min (HCC)    COPD (chronic obstructive pulmonary disease) (HCC)    Essential hypertension    Head and neck cancer (HCC) 2019   Right facial basal cell carcinoma s/p resection with right partial mastectomy and partal rhinectomy with skin graft (06/13/17)   Iron  deficiency anemia    Mass of colon 08/06/2021   Formatting of this note might be different from the original. Last Assessment & Plan:  Formatting of this note might be different from the original.  Status post right hemicolectomy by Dr. Collene Dawson today 7/12.    -Will check postop CBC and BMP     Urinary retention     Past Surgical History:  Procedure Laterality Date   ANKLE CLOSED REDUCTION Right    open reduction   BASAL CELL CARCINOMA EXCISION  2019   at [facility]   BIOPSY   07/19/2021   Procedure: BIOPSY;  Surgeon: Vinetta Greening, DO;  [facility];  Service: Endoscopy;;   CATARACT EXTRACTION W/PHACO Left 01/23/2023   Procedure: CATARACT EXTRACTION PHACO AND INTRAOCULAR LENS PLACEMENT (IOC);  Surgeon: Tarri Farm, MD;  Location: [facility];  Service: Ophthalmology;  Laterality: Left;  CDE: 23.26   COLONOSCOPY N/A 05/31/2017   Procedure: COLONOSCOPY;  Surgeon: Suzette Espy, MD;  Location: [facility];  Service: Endoscopy;  Laterality: N/A;  2:45pm   COLONOSCOPY WITH PROPOFOL  N/A 07/19/2021   Procedure: COLONOSCOPY WITH PROPOFOL ;  Surgeon: Vinetta Greening, DO;  Location: [facility];  Service: Endoscopy;  Laterality: N/A;  3:00pm, moved up to 9:00   CYSTOSCOPY N/A 10/29/2018   Procedure: CYSTOSCOPY, CLOT EVACUATION;  Surgeon: Adelbert Homans, MD;  Location: [facility];  Service: Urology;  Laterality: N/A;   PARTIAL COLECTOMY Right 08/11/2021   Procedure: PARTIAL COLECTOMY, OPEN RIGHT HEMICOLECTOMY;  Surgeon: Awilda Bogus, MD;  Location: [facility];  Service: General;  Laterality: Right;   POLYPECTOMY  05/31/2017   Procedure: POLYPECTOMY;  Surgeon: Suzette Espy, MD;  [facility]: [facility];  Service: Endoscopy;;   RIGHT/LEFT HEART CATH AND CORONARY ANGIOGRAPHY N/A 10/22/2021   Procedure: RIGHT/LEFT HEART CATH AND CORONARY ANGIOGRAPHY;  Surgeon: Kyra Phy, MD;  Location: [facility];  Service: Cardiovascular;  Laterality: N/A;   TONSILLECTOMY     TRANSCAROTID ARTERY REVASCULARIZATION  Right 02/15/2021   Procedure: RIGHT TRANSCAROTID ARTERY REVASCULARIZATION;  Surgeon: Young Hensen, MD;  Location: [facility] OR;  Service: Vascular;  Laterality: Right;   ULTRASOUND GUIDANCE FOR VASCULAR ACCESS Left 02/15/2021   Procedure: ULTRASOUND GUIDANCE FOR VASCULAR ACCESS;  Surgeon: Young Hensen, MD;  Location: [facility] OR;  Service: Vascular;  Laterality: Left;   XI ROBOTIC ASSISTED SIMPLE PROSTATECTOMY N/A 10/29/2018   Procedure: XI ROBOTIC  ASSISTED SIMPLE PROSTATECTOMY;  Surgeon: Marco Severs, MD;  Location: [facility];  Service: Urology;  Laterality: N/A;    Family History  Problem Relation Age of Onset   Stroke Father    Cirrhosis Mother    Colon cancer Neg Hx     SOCIAL HISTORY: Social History   Socioeconomic History   Marital status: Single    Spouse name: Not on file   Number of children: Not on file   Years of education: 12   Highest education level: 12th grade  Occupational History   Not on file  Tobacco Use   Smoking status: Former    Current packs/day: 0.00    Average packs/day: 0.3 packs/day for 40.0 years (10.0 ttl pk-yrs)    Types: Cigarettes    Start date: 07/02/1981    Quit date: 07/02/2021    Years since quitting: 1.9    Passive exposure: Past   Smokeless tobacco: Never   Tobacco comments:    Quit on 06/09/21  Vaping Use   Vaping status: Never Used  Substance and Sexual Activity   Alcohol use: Not Currently   Drug use: Never   Sexual activity: Not Currently  Other Topics Concern   Not on file  Social History Narrative   Not on file   Social Drivers of Health   Financial Resource Strain: Low Risk  (12/26/2022)   Overall Financial Resource Strain (CARDIA)    Difficulty of Paying Living Expenses: Not hard at all  Food Insecurity: No Food Insecurity (05/10/2023)   Hunger Vital Sign    Worried About Running Out of Food in the Last Year: Never true    Ran Out of Food in the Last Year: Never true  Transportation Needs: No Transportation Needs (05/10/2023)   PRAPARE - Administrator, Civil Service (Medical): No    Lack of Transportation (Non-Medical): No  Physical Activity: Sufficiently Active (12/26/2022)   Exercise Vital Sign    Days of Exercise per Week: 5 days    Minutes of Exercise per Session: 50 min  Stress: No Stress Concern Present (12/26/2022)   [employer] of Occupational Health - Occupational Stress Questionnaire    Feeling of Stress : Only a little  Social  Connections: Socially Isolated (05/10/2023)   Social Connection and Isolation Panel [NHANES]    Frequency of Communication with Friends and Family: More than three times a week    Frequency of Social Gatherings with Friends and Family: Once a week    Attends Religious Services: Never    Database administrator or Organizations: No    Attends Banker Meetings: Never    Marital Status: Divorced  Catering manager Violence: Not At Risk (05/10/2023)   Humiliation, Afraid, Rape, and Kick questionnaire    Fear of Current or Ex-Partner: No    Emotionally Abused: No    Physically Abused: No    Sexually Abused: No    Allergies  Allergen Reactions   Entresto  [Sacubitril -Valsartan ] Other (See Comments)    Orthostatic Hypotension Per pt, he has no allergies  Sglt2 Inhibitors Other (See Comments)    UTI's Per pt, he has no allergies    Current Outpatient Medications  Medication Sig Dispense Refill   apixaban  (ELIQUIS ) 2.5 MG TABS tablet Take 1 tablet (2.5 mg total) by mouth 2 (two) times daily. 60 tablet 1   atorvastatin  (LIPITOR ) 40 MG tablet Take 1 tablet (40 mg total) by mouth every evening. (Patient taking differently: Take 40 mg by mouth daily.) 30 tablet 1   Budeson-Glycopyrrol-Formoterol  (BREZTRI  AEROSPHERE) 160-9-4.8 MCG/ACT AERO Inhale 2 puffs into the lungs 2 (two) times daily.     carvedilol  (COREG ) 12.5 MG tablet Take 1 tablet (12.5 mg total) by mouth 2 (two) times daily with a meal. 60 tablet 1   dicyclomine  (BENTYL ) 20 MG tablet Take 1 tablet (20 mg total) by mouth 2 (two) times daily. 10 tablet 0   diltiazem  (CARDIZEM ) 30 MG tablet Take 30 mg by mouth daily as needed (heart rate >110 BPM).     losartan  (COZAAR ) 25 MG tablet Take 1 tablet (25 mg total) by mouth daily. 30 tablet 1   nitroGLYCERIN  (NITROSTAT ) 0.4 MG SL tablet Place 1 tablet (0.4 mg total) under the tongue every 5 (five) minutes as needed for chest pain. 30 tablet 0   potassium chloride  SA (KLOR-CON  M) 20  MEQ tablet Take 1 tablet (20 mEq total) by mouth daily. 30 tablet 2   torsemide  (DEMADEX ) 20 MG tablet Take 20 mg by mouth 2 (two) times daily.     Vericiguat  (VERQUVO ) 2.5 MG TABS Take 1 tablet (2.5 mg total) by mouth daily. 30 tablet 5   No current facility-administered medications for this visit.    REVIEW OF SYSTEMS:  [X]  denotes positive finding, [ ]  denotes negative finding Cardiac  Comments:  Chest pain or chest pressure:    Shortness of breath upon exertion:    Short of breath when lying flat:    Irregular heart rhythm:        Vascular    Pain in calf, thigh, or hip brought on by ambulation:    Pain in feet at night that wakes you up from your sleep:     Blood clot in your veins:    Leg swelling:         Pulmonary    Oxygen  at home:    Productive cough:     Wheezing:         Neurologic    Sudden weakness in arms or legs:     Sudden numbness in arms or legs:     Sudden onset of difficulty speaking or slurred speech:    Temporary loss of vision in one eye:     Problems with dizziness:         Gastrointestinal    Blood in stool:     Vomited blood:         Genitourinary    Burning when urinating:     Blood in urine:        Psychiatric    Major depression:         Hematologic    Bleeding problems:    Problems with blood clotting too easily:        Skin    Rashes or ulcers:        Constitutional    Fever or chills:      PHYSICAL EXAM: There were no vitals filed for this visit.  GENERAL: The patient is a well-nourished male, in no acute distress. The vital signs  are documented above. CARDIAC: There is a regular rate and rhythm.  VASCULAR:  Bilateral femoral pulses palpable No palpable pedal pulses PULMONARY: No respiratory distress. ABDOMEN: Soft and non-tender. MUSCULOSKELETAL: There are no major deformities or cyanosis. NEUROLOGIC: No focal weakness or paresthesias are detected. PSYCHIATRIC: The patient has a normal affect.  DATA:   CT abdomen  pelvis reviewed from 05/31/2023 with a 4.1 cm infrarenal abdominal aortic aneurysm.  Also noted several saccular aneurysm vs pseudoaneurysm of the right common iliac artery with the largest measuring 1.6 cm.  Assessment/Plan:  68 y.o. male, with history of A-fib, CAD, CKD 3, COPD, head and neck cancer, hypertension that presents for evaluation of a 3.9 cm AAA and 2.3 cm right common iliac aneurysm.  This was discovered incidentally on a CT from 04/26/2023. Apparently had some abdominal pain as well prompting CT in the ED.  He has since had another CT scan that I reviewed from 05/31/2023.  This shows a 4.1 cm infrarenal abdominal aortic aneurysm that appears fusiform.  Looking back his aneurysm has been noted on CT imaging as far back as 2023 with also some saccular aneurysms off the right common iliac artery.    I discussed these do not meet size criteria for repair and discussed in men we repair AAA at 5.5 cm.  I will order a CTA in 6 months and monitor for any interval change of his right iliac aneurysm as well.  Will also be due for carotid surveillance in 6 months given previous TCAR.   Young Hensen, MD [facility] Office: [phone]

## 2023-06-07 ENCOUNTER — Encounter: Payer: Self-pay | Admitting: Cardiology

## 2023-06-07 ENCOUNTER — Ambulatory Visit: Payer: 59 | Attending: Cardiology | Admitting: Cardiology

## 2023-06-07 VITALS — BP 134/82 | HR 70 | Ht 72.0 in | Wt 144.6 lb

## 2023-06-07 DIAGNOSIS — E782 Mixed hyperlipidemia: Secondary | ICD-10-CM | POA: Diagnosis not present

## 2023-06-07 DIAGNOSIS — I2510 Atherosclerotic heart disease of native coronary artery without angina pectoris: Secondary | ICD-10-CM | POA: Diagnosis not present

## 2023-06-07 DIAGNOSIS — N1832 Chronic kidney disease, stage 3b: Secondary | ICD-10-CM | POA: Diagnosis not present

## 2023-06-07 DIAGNOSIS — I480 Paroxysmal atrial fibrillation: Secondary | ICD-10-CM

## 2023-06-07 DIAGNOSIS — I5020 Unspecified systolic (congestive) heart failure: Secondary | ICD-10-CM

## 2023-06-07 MED ORDER — VERQUVO 5 MG PO TABS: 5.0000 mg | ORAL_TABLET | Freq: Every day | ORAL | 1 refills | Status: DC

## 2023-06-07 NOTE — Progress Notes (Signed)
 Cardiology Office Note  Date: 06/07/2023   ID: Michael [name], Michael [name] 11-30-1955, MRN [id number]  History of Present Illness: Michael [name] is a 68 y.o. male last seen in March by Ms. Strader PA-C, I reviewed her note.  He was also hospitalized in April, I reviewed the discharge summary.  He is here today for a follow-up visit.  Reports no increasing dyspnea beyond NYHA class II-III which is fairly chronic, no leg swelling, no orthopnea or PND.  He states that his facial wounds have reopened, had prior ENT surgery and there is concern for recurrent cancer.  He states that he has follow-up pending at [facility].  I reviewed his medications.  He states that he has been taking them regularly.  Compliance has been an issue over time.  I did review his recent lab work and echocardiogram.  He had a recent visit with [facility], saw Dr. Fulton Job on May 6 for follow-up of a 4.1 cm infrarenal AAA and also follow-up of carotid artery disease status post previous right TCAR in 2023.  Physical Exam: VS:  BP 134/82   Pulse 70   Ht 6' (1.829 m)   Wt 144 lb 9.6 oz (65.6 kg)   SpO2 98%   BMI 19.61 kg/m , BMI Body mass index is 19.61 kg/m.  Wt Readings from Last 3 Encounters:  06/07/23 144 lb 9.6 oz (65.6 kg)  06/06/23 146 lb 9.6 oz (66.5 kg)  05/31/23 143 lb (64.9 kg)    General: Patient appears comfortable at rest. HEENT: Conjunctiva and lids normal. Neck: Supple, no elevated JVP, carotid bruits present. Lungs: Clear to auscultation, nonlabored breathing at rest. Cardiac: Regular rate and rhythm, no S3, 2/6 systolic murmur. Extremities: No pitting edema.  ECG:  An ECG dated 05/31/2023 was personally reviewed today and demonstrated:  Sinus rhythm with prolonged PR interval, PVCs, IVCD with poor R wave progression.  Labwork: 05/16/2023: Magnesium  2.2 05/28/2023: B Natriuretic Peptide >4,500.0 05/31/2023: ALT 30; AST 25; BUN 22; Creatinine, Ser 1.63; Hemoglobin 12.2; Platelets 244; Potassium  3.5; Sodium 141     Component Value Date/Time   CHOL 62 07/24/2022 1130   TRIG 60 07/24/2022 1130   HDL 32 (L) 07/24/2022 1130   CHOLHDL 1.9 07/24/2022 1130   VLDL 12 07/24/2022 1130   LDLCALC 18 07/24/2022 1130   Other Studies Reviewed Today:  Echocardiogram 04/20/2023:  1. Left ventricular ejection fraction, by estimation, is 20 to 25%. The  left ventricle has severely decreased function. The left ventricle  demonstrates regional wall motion abnormalities (see scoring  diagram/findings for description). The left  ventricular internal cavity size was mildly to moderately dilated. There  is mild concentric left ventricular hypertrophy. Left ventricular  diastolic parameters are consistent with Grade II diastolic dysfunction  (pseudonormalization).   2. Right ventricular systolic function is mildly reduced. The right  ventricular size is normal. Tricuspid regurgitation signal is inadequate  for assessing PA pressure.   3. Left atrial size was mildly dilated.   4. Right atrial size was mildly dilated.   5. The mitral valve is degenerative. Mild mitral valve regurgitation.   6. The aortic valve is tricuspid. Aortic valve regurgitation is not  visualized. No aortic stenosis is present. Aortic valve mean gradient  measures 3.5 mmHg.   7. The inferior vena cava is normal in size with greater than 50%  respiratory variability, suggesting right atrial pressure of 3 mmHg.   Assessment and Plan:  1.  HFrEF, nonischemic cardiomyopathy  with LVEF 20 to 25% by echocardiogram in March, mild RV dysfunction also noted at that time.  Reports NYHA class II 3 dyspnea, weight has been stable, no obvious progressive peripheral edema.  Medication compliance has been an issue over time, he states that he is taking his current regimen which includes Coreg  12.5 mg twice daily, Cozaar  25 mg daily, Demadex  20 mg twice a day, KCl 20 mEq daily, and Verquvo  which will be increased to 5 mg daily.  2.  Mild to  moderate nonobstructive CAD documented at cardiac catheterization in 2023.  Continue Lipitor  40 mg daily.  He has as needed nitroglycerin  available as well.  3.  Paroxysmal atrial fibrillation with CHA2DS2-VASc score of 4.  Heart rate regular today, he denies any increasing sense of palpitations.  Continue Eliquis  for stroke prophylaxis.  4.  Asymptomatic 4.1 cm infrarenal AAA followed by [facility], recent visit reviewed.  5.  Carotid artery disease status post right TCAR in 2023, also followed by [facility].  6.  CKD stage IIIb, creatinine 1.63 with GFR 46.  Disposition:  Follow up  3 months.  Signed, Gerard Knight, M.D., F.A.C.C. [facility]

## 2023-06-07 NOTE — Patient Instructions (Signed)
 Medication Instructions:  INCREASE Verquvo  to 5 mg daily  Labwork: None today  Testing/Procedures: None today  Follow-Up: 3 months  Any Other Special Instructions Will Be Listed Below (If Applicable).  If you need a refill on your cardiac medications before your next appointment, please call your pharmacy.

## 2023-06-08 ENCOUNTER — Ambulatory Visit: Admitting: Student

## 2023-06-13 ENCOUNTER — Other Ambulatory Visit: Payer: Self-pay

## 2023-06-13 ENCOUNTER — Encounter (HOSPITAL_COMMUNITY): Payer: Self-pay

## 2023-06-13 ENCOUNTER — Emergency Department (HOSPITAL_COMMUNITY)
Admission: EM | Admit: 2023-06-13 | Discharge: 2023-06-13 | Disposition: A | Discharge status: Home / Self Care | Attending: Emergency Medicine | Admitting: Emergency Medicine

## 2023-06-13 ENCOUNTER — Emergency Department (HOSPITAL_COMMUNITY)

## 2023-06-13 DIAGNOSIS — R0602 Shortness of breath: Secondary | ICD-10-CM | POA: Insufficient documentation

## 2023-06-13 DIAGNOSIS — R600 Localized edema: Secondary | ICD-10-CM | POA: Insufficient documentation

## 2023-06-13 DIAGNOSIS — J449 Chronic obstructive pulmonary disease, unspecified: Secondary | ICD-10-CM | POA: Diagnosis not present

## 2023-06-13 DIAGNOSIS — R062 Wheezing: Secondary | ICD-10-CM | POA: Insufficient documentation

## 2023-06-13 DIAGNOSIS — N189 Chronic kidney disease, unspecified: Secondary | ICD-10-CM | POA: Insufficient documentation

## 2023-06-13 DIAGNOSIS — Z7901 Long term (current) use of anticoagulants: Secondary | ICD-10-CM | POA: Insufficient documentation

## 2023-06-13 DIAGNOSIS — I517 Cardiomegaly: Secondary | ICD-10-CM | POA: Diagnosis not present

## 2023-06-13 DIAGNOSIS — I509 Heart failure, unspecified: Secondary | ICD-10-CM | POA: Diagnosis not present

## 2023-06-13 DIAGNOSIS — R0989 Other specified symptoms and signs involving the circulatory and respiratory systems: Secondary | ICD-10-CM | POA: Diagnosis not present

## 2023-06-13 MED ORDER — ALBUTEROL SULFATE HFA 108 (90 BASE) MCG/ACT IN AERS
2.0000 | INHALATION_SPRAY | Freq: Once | RESPIRATORY_TRACT | Status: AC
Start: 2023-06-13 — End: 2023-06-13
  Administered 2023-06-13: 2 via RESPIRATORY_TRACT
  Filled 2023-06-13: qty 6.7

## 2023-06-13 NOTE — ED Notes (Signed)
 Pt reports he was recently started on the medication Verquvo  and is unsure if it is related to his SOB.

## 2023-06-13 NOTE — ED Triage Notes (Signed)
 Pt arrived via POV c/o SOB since 6pm last night. Pt reports he thinks the construction on his road by his house triggered his SOB. Pt reports trying his inhaler and neb treatments at home w/o relief.

## 2023-06-13 NOTE — Discharge Instructions (Signed)
 Follow-up with your primary doctor in several days.  Return to the emergency department if you experience any concerning symptoms or

## 2023-06-13 NOTE — ED Provider Notes (Signed)
 [place] EMERGENCY DEPARTMENT AT [facility] Provider Note   CSN: [id number] Arrival date & time: 06/13/23  1631     History {Add pertinent medical, surgical, social history, OB history to HPI:1} Chief Complaint  Patient presents with   Shortness of Breath    Merrell Vittori is a 68 y.o. male.  68 year old male with a history of CHF, COPD, and CKD who presents emergency department shortness of breath.  Who reports that he was outside today and some construction by his home triggered some shortness of breath for him.  Did try an inhaler treatment at home without relief.  Denies any pain.  No cough or fevers or recent illnesses.  No worsened leg swelling recently.       Home Medications Prior to Admission medications   Medication Sig Start Date End Date Taking? Authorizing Provider  apixaban  (ELIQUIS ) 2.5 MG TABS tablet Take 1 tablet (2.5 mg total) by mouth 2 (two) times daily. 04/21/23   Mason Sole, Pratik D, DO  atorvastatin  (LIPITOR ) 40 MG tablet Take 1 tablet (40 mg total) by mouth every evening. Patient taking differently: Take 40 mg by mouth daily. 08/01/22 02/05/24  ShahmehdiConstantino Demark, MD  Budeson-Glycopyrrol-Formoterol  (BREZTRI  AEROSPHERE) 160-9-4.8 MCG/ACT AERO Inhale 2 puffs into the lungs 2 (two) times daily.    [provider]  carvedilol  (COREG ) 12.5 MG tablet Take 1 tablet (12.5 mg total) by mouth 2 (two) times daily with a meal. 05/17/23   Tat, Myrtie Atkinson, MD  dicyclomine  (BENTYL ) 20 MG tablet Take 1 tablet (20 mg total) by mouth 2 (two) times daily. 05/31/23   Baxter Limber A, PA-C  diltiazem  (CARDIZEM ) 30 MG tablet Take 30 mg by mouth daily as needed (heart rate >110 BPM).    [provider]  losartan  (COZAAR ) 25 MG tablet Take 1 tablet (25 mg total) by mouth daily. 05/17/23   Demaris Fillers, MD  nitroGLYCERIN  (NITROSTAT ) 0.4 MG SL tablet Place 1 tablet (0.4 mg total) under the tongue every 5 (five) minutes as needed for chest pain. 02/05/23   Merdis Stalling, MD  potassium chloride  SA (KLOR-CON  M) 20 MEQ tablet Take 1 tablet (20 mEq total) by mouth daily. 07/26/22 02/05/24  Bobbetta Burnet, MD  torsemide  (DEMADEX ) 20 MG tablet Take 20 mg by mouth 2 (two) times daily.    [provider]  Vericiguat  (VERQUVO ) 5 MG TABS Take 1 tablet (5 mg total) by mouth daily. 06/07/23   Gerard Knight, MD      Allergies    Entresto  [sacubitril -valsartan ] and Sglt2 inhibitors    Review of Systems   Review of Systems  Physical Exam Updated Vital Signs BP (!) 155/101 (BP Location: Right Arm)   Pulse 87   Temp 98.1 F (36.7 C) (Temporal)   Resp (!) 22   Ht 6' (1.829 m)   Wt 65.6 kg   SpO2 99%   BMI 19.61 kg/m  Physical Exam Vitals and nursing note reviewed.  Constitutional:      General: He is not in acute distress.    Appearance: He is well-developed.  HENT:     Head: Normocephalic and atraumatic.     Right Ear: External ear normal.     Left Ear: External ear normal.     Nose: Nose normal.  Eyes:     Extraocular Movements: Extraocular movements intact.     Conjunctiva/sclera: Conjunctivae normal.     Pupils: Pupils are equal, round, and reactive to light.  Cardiovascular:  Rate and Rhythm: Normal rate and regular rhythm.     Heart sounds: Normal heart sounds.  Pulmonary:     Effort: Pulmonary effort is normal.     Breath sounds: Wheezing (faint expiratory) present.  Musculoskeletal:     Cervical back: Normal range of motion and neck supple.     Right lower leg: Edema (1+) present.     Left lower leg: Edema (1+) present.  Skin:    General: Skin is warm and dry.  Neurological:     Mental Status: He is alert. Mental status is at baseline.  Psychiatric:        Mood and Affect: Mood normal.        Behavior: Behavior normal.     ED Results / Procedures / Treatments   Labs (all labs ordered are listed, but only abnormal results are displayed) Labs Reviewed - No data to display  EKG None  Radiology No results  found.  Procedures Procedures  {Document cardiac monitor, telemetry assessment procedure when appropriate:1}  Medications Ordered in ED Medications  albuterol  (VENTOLIN  HFA) 108 (90 Base) MCG/ACT inhaler 2 puff (has no administration in time range)    ED Course/ Medical Decision Making/ A&P   {   Click here for ABCD2, HEART and other calculatorsREFRESH Note before signing :1}                              Medical Decision Making Amount and/or Complexity of Data Reviewed Radiology: ordered.  Risk Prescription drug management.   ***  {Document critical care time when appropriate:1} {Document review of labs and clinical decision tools ie heart score, Chads2Vasc2 etc:1}  {Document your independent review of radiology images, and any outside records:1} {Document your discussion with family members, caretakers, and with consultants:1} {Document social determinants of health affecting pt's care:1} {Document your decision making why or why not admission, treatments were needed:1} Final Clinical Impression(s) / ED Diagnoses Final diagnoses:  None    Rx / DC Orders ED Discharge Orders     None

## 2023-06-15 ENCOUNTER — Emergency Department (HOSPITAL_COMMUNITY)

## 2023-06-15 ENCOUNTER — Encounter (HOSPITAL_COMMUNITY): Payer: Self-pay | Admitting: Emergency Medicine

## 2023-06-15 ENCOUNTER — Other Ambulatory Visit: Payer: Self-pay

## 2023-06-15 ENCOUNTER — Emergency Department (HOSPITAL_COMMUNITY)
Admission: EM | Admit: 2023-06-15 | Discharge: 2023-06-15 | Disposition: A | Discharge status: Home / Self Care | Attending: Emergency Medicine | Admitting: Emergency Medicine

## 2023-06-15 DIAGNOSIS — K8020 Calculus of gallbladder without cholecystitis without obstruction: Secondary | ICD-10-CM | POA: Insufficient documentation

## 2023-06-15 DIAGNOSIS — I509 Heart failure, unspecified: Secondary | ICD-10-CM | POA: Diagnosis not present

## 2023-06-15 DIAGNOSIS — K828 Other specified diseases of gallbladder: Secondary | ICD-10-CM | POA: Diagnosis not present

## 2023-06-15 DIAGNOSIS — J449 Chronic obstructive pulmonary disease, unspecified: Secondary | ICD-10-CM | POA: Diagnosis not present

## 2023-06-15 DIAGNOSIS — I110 Hypertensive heart disease with heart failure: Secondary | ICD-10-CM | POA: Diagnosis not present

## 2023-06-15 DIAGNOSIS — I5043 Acute on chronic combined systolic (congestive) and diastolic (congestive) heart failure: Secondary | ICD-10-CM | POA: Diagnosis not present

## 2023-06-15 DIAGNOSIS — R6889 Other general symptoms and signs: Secondary | ICD-10-CM | POA: Diagnosis not present

## 2023-06-15 DIAGNOSIS — Z79899 Other long term (current) drug therapy: Secondary | ICD-10-CM | POA: Insufficient documentation

## 2023-06-15 DIAGNOSIS — R109 Unspecified abdominal pain: Secondary | ICD-10-CM | POA: Diagnosis not present

## 2023-06-15 DIAGNOSIS — K761 Chronic passive congestion of liver: Secondary | ICD-10-CM | POA: Insufficient documentation

## 2023-06-15 DIAGNOSIS — Z7901 Long term (current) use of anticoagulants: Secondary | ICD-10-CM | POA: Diagnosis not present

## 2023-06-15 DIAGNOSIS — R0602 Shortness of breath: Secondary | ICD-10-CM | POA: Diagnosis not present

## 2023-06-15 DIAGNOSIS — R1011 Right upper quadrant pain: Secondary | ICD-10-CM | POA: Diagnosis not present

## 2023-06-15 DIAGNOSIS — K838 Other specified diseases of biliary tract: Secondary | ICD-10-CM | POA: Diagnosis not present

## 2023-06-15 DIAGNOSIS — R0789 Other chest pain: Secondary | ICD-10-CM | POA: Diagnosis not present

## 2023-06-15 DIAGNOSIS — Z743 Need for continuous supervision: Secondary | ICD-10-CM | POA: Diagnosis not present

## 2023-06-15 LAB — CBC WITH DIFFERENTIAL/PLATELET
Abs Immature Granulocytes: 0.02 10*3/uL (ref 0.00–0.07)
Basophils Absolute: 0.1 10*3/uL (ref 0.0–0.1)
Basophils Relative: 1 %
Eosinophils Absolute: 0.1 10*3/uL (ref 0.0–0.5)
Eosinophils Relative: 1 %
HCT: 40.9 % (ref 39.0–52.0)
Hemoglobin: 12.6 g/dL — ABNORMAL LOW (ref 13.0–17.0)
Immature Granulocytes: 0 %
Lymphocytes Relative: 18 %
Lymphs Abs: 1.2 10*3/uL (ref 0.7–4.0)
MCH: 28.3 pg (ref 26.0–34.0)
MCHC: 30.8 g/dL (ref 30.0–36.0)
MCV: 91.7 fL (ref 80.0–100.0)
Monocytes Absolute: 0.6 10*3/uL (ref 0.1–1.0)
Monocytes Relative: 8 %
Neutro Abs: 4.9 10*3/uL (ref 1.7–7.7)
Neutrophils Relative %: 72 %
Platelets: 263 10*3/uL (ref 150–400)
RBC: 4.46 MIL/uL (ref 4.22–5.81)
RDW: 17.8 % — ABNORMAL HIGH (ref 11.5–15.5)
WBC: 6.8 10*3/uL (ref 4.0–10.5)
nRBC: 0 % (ref 0.0–0.2)

## 2023-06-15 LAB — URINALYSIS, ROUTINE W REFLEX MICROSCOPIC
Bacteria, UA: NONE SEEN
Bilirubin Urine: NEGATIVE
Glucose, UA: NEGATIVE mg/dL
Ketones, ur: NEGATIVE mg/dL
Leukocytes,Ua: NEGATIVE
Nitrite: NEGATIVE
Protein, ur: 100 mg/dL — AB
Specific Gravity, Urine: 1.02 (ref 1.005–1.030)
pH: 5 (ref 5.0–8.0)

## 2023-06-15 LAB — COMPREHENSIVE METABOLIC PANEL WITH GFR
ALT: 17 U/L (ref 0–44)
AST: 23 U/L (ref 15–41)
Albumin: 3.1 g/dL — ABNORMAL LOW (ref 3.5–5.0)
Alkaline Phosphatase: 140 U/L — ABNORMAL HIGH (ref 38–126)
Anion gap: 10 (ref 5–15)
BUN: 32 mg/dL — ABNORMAL HIGH (ref 8–23)
CO2: 24 mmol/L (ref 22–32)
Calcium: 8.8 mg/dL — ABNORMAL LOW (ref 8.9–10.3)
Chloride: 106 mmol/L (ref 98–111)
Creatinine, Ser: 2.09 mg/dL — ABNORMAL HIGH (ref 0.61–1.24)
GFR, Estimated: 34 mL/min — ABNORMAL LOW (ref >60)
Glucose, Bld: 170 mg/dL — ABNORMAL HIGH (ref 70–99)
Potassium: 3.2 mmol/L — ABNORMAL LOW (ref 3.5–5.1)
Sodium: 140 mmol/L (ref 135–145)
Total Bilirubin: 2 mg/dL — ABNORMAL HIGH (ref 0.0–1.2)
Total Protein: 6.8 g/dL (ref 6.5–8.1)

## 2023-06-15 LAB — BRAIN NATRIURETIC PEPTIDE: B Natriuretic Peptide: >4500 pg/mL — ABNORMAL HIGH (ref 0.0–100.0)

## 2023-06-15 LAB — LIPASE, BLOOD: Lipase: 27 U/L (ref 11–51)

## 2023-06-15 MED ORDER — FENTANYL CITRATE PF 50 MCG/ML IJ SOSY
50.0000 ug | PREFILLED_SYRINGE | Freq: Once | INTRAMUSCULAR | Status: AC
  Administered 2023-06-15: 50 ug via INTRAVENOUS
  Filled 2023-06-15: qty 1

## 2023-06-15 NOTE — ED Notes (Signed)
 Contacts have been notified in the chart. Friend said a ride will be sorted out for the pt.

## 2023-06-15 NOTE — Discharge Instructions (Signed)
 The workup in the emergency room reveals some evidence of fluid backing up into your liver because of your heart failure. There is no evidence of surgical emergency.  We recommend you call your primary care doctor to see if they can give you a prompt follow-up with [facility] general surgery for the gallstones.  We also recommend that you follow-up with cardiologist for your shortness of breath and congestive heart failure.

## 2023-06-15 NOTE — ED Provider Notes (Signed)
 [place] EMERGENCY DEPARTMENT AT [facility] Provider Note   CSN: [id number] Arrival date & time: 06/15/23  1536     History  Chief Complaint  Patient presents with   Abdominal Pain   Flank Pain   Chest Pain    Michael [name] is a 68 y.o. male.  HPI     68 year old male comes in with chief complaint of chest pain, flank pain, abdominal pain.  Patient has history of COPD, CHF, gallstone.  Patient states that he has been having right-sided pain covering his lower chest and upper abdomen for the last 2 days.  He has had this type of pain before because of gallstones.  He is awaiting call from [facility] to get his gallbladder resected.  Pain initially was intermittent, but now lasting longer.  Pain is moderately severe and similar to his previous gallstones.  The pain is no specific evoking, aggravating or relieving factors including no association with food intake.  Patient also reports some shortness of breath, but states that it is baseline shortness of breath.  He has been taking his medications as prescribed.  Home Medications Prior to Admission medications   Medication Sig Start Date End Date Taking? Authorizing Provider  apixaban  (ELIQUIS ) 2.5 MG TABS tablet Take 1 tablet (2.5 mg total) by mouth 2 (two) times daily. 04/21/23   Mason Sole, Pratik D, DO  atorvastatin  (LIPITOR ) 40 MG tablet Take 1 tablet (40 mg total) by mouth every evening. Patient taking differently: Take 40 mg by mouth daily. 08/01/22 02/05/24  Bobbetta Burnet, MD  Budeson-Glycopyrrol-Formoterol  (BREZTRI  AEROSPHERE) 160-9-4.8 MCG/ACT AERO Inhale 2 puffs into the lungs 2 (two) times daily.    [provider]  carvedilol  (COREG ) 12.5 MG tablet Take 1 tablet (12.5 mg total) by mouth 2 (two) times daily with a meal. 05/17/23   Tat, Myrtie Atkinson, MD  dicyclomine  (BENTYL ) 20 MG tablet Take 1 tablet (20 mg total) by mouth 2 (two) times daily. 05/31/23   Baxter Limber A, PA-C  diltiazem  (CARDIZEM ) 30  MG tablet Take 30 mg by mouth daily as needed (heart rate >110 BPM).    [provider]  losartan  (COZAAR ) 25 MG tablet Take 1 tablet (25 mg total) by mouth daily. 05/17/23   Demaris Fillers, MD  nitroGLYCERIN  (NITROSTAT ) 0.4 MG SL tablet Place 1 tablet (0.4 mg total) under the tongue every 5 (five) minutes as needed for chest pain. 02/05/23   Merdis Stalling, MD  potassium chloride  SA (KLOR-CON  M) 20 MEQ tablet Take 1 tablet (20 mEq total) by mouth daily. 07/26/22 02/05/24  Bobbetta Burnet, MD  torsemide  (DEMADEX ) 20 MG tablet Take 20 mg by mouth 2 (two) times daily.    [provider]  Vericiguat  (VERQUVO ) 5 MG TABS Take 1 tablet (5 mg total) by mouth daily. 06/07/23   Gerard Knight, MD      Allergies    Entresto  [sacubitril -valsartan ] and Sglt2 inhibitors    Review of Systems   Review of Systems  All other systems reviewed and are negative.   Physical Exam Updated Vital Signs BP (!) 167/147   Pulse 80   Temp (!) 97.4 F (36.3 C) (Oral)   Resp 15   Ht 6' (1.829 m)   Wt 64.9 kg   SpO2 96%   BMI 19.39 kg/m  Physical Exam Vitals and nursing note reviewed.  Constitutional:      Appearance: He is well-developed.  HENT:     Head: Atraumatic.  Cardiovascular:     Rate and Rhythm: Normal rate.  Pulmonary:     Effort: Pulmonary effort is normal.  Abdominal:     Tenderness: There is abdominal tenderness in the right upper quadrant. There is no right CVA tenderness or guarding. Negative signs include Murphy's sign.  Musculoskeletal:     Cervical back: Neck supple.  Skin:    General: Skin is warm.  Neurological:     Mental Status: He is alert and oriented to person, place, and time.     ED Results / Procedures / Treatments   Labs (all labs ordered are listed, but only abnormal results are displayed) Labs Reviewed  CBC WITH DIFFERENTIAL/PLATELET - Abnormal; Notable for the following components:      Result Value   Hemoglobin 12.6 (*)    RDW 17.8 (*)    All  other components within normal limits  COMPREHENSIVE METABOLIC PANEL WITH GFR - Abnormal; Notable for the following components:   Potassium 3.2 (*)    Glucose, Bld 170 (*)    BUN 32 (*)    Creatinine, Ser 2.09 (*)    Calcium  8.8 (*)    Albumin  3.1 (*)    Alkaline Phosphatase 140 (*)    Total Bilirubin 2.0 (*)    GFR, Estimated 34 (*)    All other components within normal limits  URINALYSIS, ROUTINE W REFLEX MICROSCOPIC - Abnormal; Notable for the following components:   Hgb urine dipstick SMALL (*)    Protein, ur 100 (*)    All other components within normal limits  BRAIN NATRIURETIC PEPTIDE - Abnormal; Notable for the following components:   B Natriuretic Peptide >4,500.0 (*)    All other components within normal limits  LIPASE, BLOOD    EKG EKG Interpretation Date/Time:  Thursday Jun 15 2023 16:03:43 EDT Ventricular Rate:  78 PR Interval:  267 QRS Duration:  136 QT Interval:  444 QTC Calculation: 506 R Axis:   41  Text Interpretation: Sinus or ectopic atrial rhythm Prolonged PR interval Probable left ventricular hypertrophy Anterior Q waves, possibly due to LVH Prolonged QT interval No significant change since last tracing Confirmed by Deatra Face [phone]) on 06/15/2023 8:38:13 PM  Radiology US  Abdomen Limited RUQ (LIVER/GB) Result Date: 06/15/2023 CLINICAL DATA:  [id number] Abdominal pain, RUQ [id number] EXAM: ULTRASOUND ABDOMEN LIMITED RIGHT UPPER QUADRANT COMPARISON:  May 31, 2023 FINDINGS: Gallbladder: Redemonstrated single large gallstone filling the majority of the lumen and obscuring the majority of the downstream gallbladder. Biliary sludge may also be present within the gallbladder lumen. The partially visualized gallbladder wall in the fundal region appears thickened measuring 7 mm. No sonographic Murphy's sign noted by sonographer. Common bile duct: Diameter: 12 mm Liver: Normal echogenicity. No focal lesion identified. No intrahepatic biliary ductal dilation. Portal vein  is patent on color Doppler imaging with normal direction of blood flow towards the liver. Other: None. IMPRESSION: 1. Redemonstrated single large gallstone filling the majority of the gallbladder lumen and obscuring the majority of the downstream gallbladder. The partially visualized wall in the fundal region appears thickened measuring 7 mm. In the absence of a sonographic Murphy's sign and pericholecystic fluid, these changes may be related to acute hepatitis, upper abdominal inflammation, or volume overload, either from CHF or renal failure. Laboratory correlation recommended. 2. Redemonstrated abnormal dilation of the common bile duct measuring 12 mm. Correlation with serum bilirubin recommended. Electronically Signed   By: Rance Burrows M.D.   On: 06/15/2023 17:53    Procedures Procedures  Medications Ordered in ED Medications  fentaNYL  (SUBLIMAZE ) injection 50 mcg (50 mcg Intravenous Given 06/15/23 1733)    ED Course/ Medical Decision Making/ A&P                                 Medical Decision Making Amount and/or Complexity of Data Reviewed Labs: ordered. Radiology: ordered.  Risk Prescription drug management.   This patient presents to the ED with chief complaint(s) of right-sided chest and abdominal pain, flank pain with pertinent past medical history of gallstones, CHF, AAA.patient just had a CT scan done on 4-30, it showed some vascular changes and gallstone.  Patient subsequently came to the ER on 5-13 for shortness of breath, but the chest x-ray was unremarkable..  The complaint involves an extensive differential diagnosis and also carries with it a high risk of complications and morbidity.    The differential diagnosis includes : Gallstones, gallstone pancreatitis, cholecystitis, pyelonephritis, kidney stone  The initial plan is to get basic labs, ultrasound right upper quadrant. Patient indicates that he is awaiting surgical care by [facility].   Additional history  obtained: Records reviewed previous admission documents and recent imaging from ER visits.  CT scan of the abdomen and x-ray of the chest from within the last month reviewed.  Independent labs interpretation:  The following labs were independently interpreted: Patient has elevated alk phos, bilirubin, but they have been elevated the last several visits.  Creatinine is slightly elevated from baseline at 2.09.  Potassium was 3.2.  UA does not show any signs of infection.  Patient has no leukocytosis or profound anemia.  Independent visualization and interpretation of imaging: - I independently visualized the following imaging with scope of interpretation limited to determining acute life threatening conditions related to emergency care: Ultrasound, which revealed large gallstone.  Per radiologist,  Redemonstrated single large gallstone filling the majority of the  gallbladder lumen and obscuring the majority of the downstream  gallbladder. The partially visualized wall in the fundal region  appears thickened measuring 7 mm. In the absence of a sonographic  Murphy's sign and pericholecystic fluid, these changes may be  related to acute hepatitis, upper abdominal inflammation, or volume  overload, either from CHF or renal failure. Laboratory correlation  recommended.  2. Redemonstrated abnormal dilation of the common bile duct  measuring 12 mm. Correlation with serum bilirubin recommended.   Based on the ultrasound findings above, I reassessed the patient.  Informs that he has shortness of breath, but is not new.  I proceeded to get x-ray of the chest, which reveals no evidence of pulmonary edema.  I have ordered BNP, but I note that patient's BNP is always over 4500.   Treatment and Reassessment: I reassessed the patient after the ultrasound.  I discussed with him that I would like to get x-ray and BNP.  Patient states that he would like to go home, as he is feeling better at this time.   Ultimately agreed to getting an x-ray done.  I have independently interpreted x-ray, there is no evidence of volume overload.  BNP is over 4500, that is chronic for him.  I have put in a referral to cardiology.  I suspect that he is having some worsening of his CHF.  Patient is not quite decompensated yet.  I think it is prudent that he follows up with cardiology soon as possible.   Final Clinical Impression(s) / ED Diagnoses Final diagnoses:  Gall stones  Congestive heart failure, unspecified HF chronicity, unspecified heart failure type (HCC)  Hepatic congestion  Acute on chronic combined systolic (congestive) and diastolic (congestive) heart failure (HCC)    Rx / DC Orders ED Discharge Orders          Ordered    Ambulatory referral to Cardiology       Comments: If you have not heard from the Cardiology office within the next 72 hours please call [phone].   06/15/23 2100              Deatra Face, MD 06/15/23 2102

## 2023-06-15 NOTE — ED Triage Notes (Signed)
 Pt bib rcems w/ c/o pain on the right chest, abdomen, and flank. Pt has labored breathing in triage. Pt rates pain 7/10

## 2023-06-16 DIAGNOSIS — I5043 Acute on chronic combined systolic (congestive) and diastolic (congestive) heart failure: Secondary | ICD-10-CM | POA: Diagnosis not present

## 2023-06-22 DIAGNOSIS — I10 Essential (primary) hypertension: Secondary | ICD-10-CM | POA: Diagnosis not present

## 2023-06-22 DIAGNOSIS — N183 Chronic kidney disease, stage 3 unspecified: Secondary | ICD-10-CM | POA: Diagnosis not present

## 2023-06-22 DIAGNOSIS — I2510 Atherosclerotic heart disease of native coronary artery without angina pectoris: Secondary | ICD-10-CM | POA: Diagnosis not present

## 2023-06-22 DIAGNOSIS — F17210 Nicotine dependence, cigarettes, uncomplicated: Secondary | ICD-10-CM | POA: Diagnosis not present

## 2023-06-22 DIAGNOSIS — J449 Chronic obstructive pulmonary disease, unspecified: Secondary | ICD-10-CM | POA: Diagnosis not present

## 2023-06-22 DIAGNOSIS — D509 Iron deficiency anemia, unspecified: Secondary | ICD-10-CM | POA: Diagnosis not present

## 2023-06-22 DIAGNOSIS — I130 Hypertensive heart and chronic kidney disease with heart failure and stage 1 through stage 4 chronic kidney disease, or unspecified chronic kidney disease: Secondary | ICD-10-CM | POA: Diagnosis not present

## 2023-06-22 DIAGNOSIS — I482 Chronic atrial fibrillation, unspecified: Secondary | ICD-10-CM | POA: Diagnosis not present

## 2023-06-22 DIAGNOSIS — F17200 Nicotine dependence, unspecified, uncomplicated: Secondary | ICD-10-CM | POA: Diagnosis not present

## 2023-06-22 DIAGNOSIS — R55 Syncope and collapse: Secondary | ICD-10-CM | POA: Diagnosis not present

## 2023-06-22 DIAGNOSIS — I5043 Acute on chronic combined systolic (congestive) and diastolic (congestive) heart failure: Secondary | ICD-10-CM | POA: Diagnosis not present

## 2023-06-22 DIAGNOSIS — I509 Heart failure, unspecified: Secondary | ICD-10-CM | POA: Diagnosis not present

## 2023-06-22 DIAGNOSIS — J9601 Acute respiratory failure with hypoxia: Secondary | ICD-10-CM | POA: Diagnosis not present

## 2023-06-25 ENCOUNTER — Ambulatory Visit
Admission: EM | Admit: 2023-06-25 | Discharge: 2023-06-25 | Disposition: A | Discharge status: Home / Self Care | Attending: Nurse Practitioner | Admitting: Nurse Practitioner

## 2023-06-25 DIAGNOSIS — R062 Wheezing: Secondary | ICD-10-CM

## 2023-06-25 DIAGNOSIS — Z8709 Personal history of other diseases of the respiratory system: Secondary | ICD-10-CM

## 2023-06-25 DIAGNOSIS — R059 Cough, unspecified: Secondary | ICD-10-CM

## 2023-06-25 DIAGNOSIS — R0602 Shortness of breath: Secondary | ICD-10-CM | POA: Diagnosis not present

## 2023-06-25 MED ORDER — PREDNISONE 20 MG PO TABS: 40.0000 mg | ORAL_TABLET | Freq: Every day | ORAL | 0 refills | Status: DC

## 2023-06-25 MED ORDER — AMOXICILLIN-POT CLAVULANATE 875-125 MG PO TABS: 1.0000 | ORAL_TABLET | Freq: Two times a day (BID) | ORAL | 0 refills | Status: DC

## 2023-06-25 MED ORDER — IPRATROPIUM-ALBUTEROL 0.5-2.5 (3) MG/3ML IN SOLN
3.0000 mL | Freq: Once | RESPIRATORY_TRACT | Status: AC
  Administered 2023-06-25: 3 mL via RESPIRATORY_TRACT

## 2023-06-25 MED ORDER — GUAIFENESIN 100 MG/5ML PO LIQD: 10.0000 mL | Freq: Four times a day (QID) | ORAL | 0 refills | Status: DC | PRN

## 2023-06-25 MED ORDER — METHYLPREDNISOLONE SODIUM SUCC 125 MG IJ SOLR
125.0000 mg | Freq: Once | INTRAMUSCULAR | Status: AC
  Administered 2023-06-25: 125 mg via INTRAMUSCULAR

## 2023-06-25 NOTE — Discharge Instructions (Addendum)
 You were given an injection of Solumedrol 125mg  and a nebulizer treatment. Start the prednisone  on 06/26/23. Take medication as prescribed. Continue your current medications as prescribed. You may take Tylenol  as needed for pain, fever, or general discomfort. Recommend use of a humidifier in your bedroom at night during sleep and to sleep elevated on pillows while symptoms persist. Go to the emergency department if you experience worsening wheezing, shortness of breath or difficulty breathing. Follow-up with your primary care physician within 7 to 10 days for reevaluation or if symptoms fail to improve. Follow-up as needed.

## 2023-06-25 NOTE — ED Triage Notes (Signed)
 Sx x 1 day  Productive cough with yellow mucus

## 2023-06-25 NOTE — ED Provider Notes (Signed)
 [facility]    CSN: [id number] Arrival date & time: 06/25/23  1208      History   Chief Complaint Chief Complaint  Patient presents with   Cough    HPI Michael [name] is a 68 y.o. male.   The history is provided by the patient.   Patient presents with a 1 day history of cough, wheezing, and shortness of breath.  Patient denies fever, chills, headache, ear pain, chest pain, abdominal pain, nausea, vomiting, diarrhea, or rash.  Patient with underlying history of COPD and to head and neck cancer.  States that he does have inhalers for COPD but he has not used them today.  Patient denies any recent exposure that may have triggered his COPD.  He has not taken any medications for his symptoms.  Past Medical History:  Diagnosis Date   Aortic atherosclerosis (HCC) 08/03/2021   Atrial fibrillation (HCC)    CAD (coronary artery disease) 08/03/2021   Cardiac catheterization September 2023 with RCA/RV marginal stenosis managed medically   Cardiomyopathy (HCC)    Carotid artery disease (HCC)    CKD (chronic kidney disease) stage 3, GFR 30-59 ml/min (HCC)    COPD (chronic obstructive pulmonary disease) (HCC)    Essential hypertension    Head and neck cancer (HCC) 2019   Right facial basal cell carcinoma s/p resection with right partial mastectomy and partal rhinectomy with skin graft (06/13/17)   Iron  deficiency anemia    Mass of colon 08/06/2021   Formatting of this note might be different from the original. Last Assessment & Plan:  Formatting of this note might be different from the original.  Status post right hemicolectomy by Dr. Collene Dawson today 7/12.    -Will check postop CBC and BMP     Urinary retention     Patient Active Problem List   Diagnosis Date Noted   AAA (abdominal aortic aneurysm) without rupture (HCC) 06/06/2023   Hypertensive urgency 05/10/2023   Acute on chronic combined systolic (congestive) and diastolic (congestive) heart failure (HCC) 04/18/2023    Atrial fibrillation (HCC) 02/05/2023   Body mass index (BMI) 27.0-27.9, adult 12/01/2022   H/O prostatectomy 12/01/2022   Malnutrition of moderate degree 11/24/2022   Chronic obstructive pulmonary disease (HCC) 11/22/2022   Chronic diastolic CHF (congestive heart failure) (HCC) 11/22/2022   Calculus of gallbladder without cholecystitis without obstruction 08/10/2022   Acute on chronic combined systolic and diastolic CHF (congestive heart failure) (HCC) 07/24/2022   Nicotine  dependence, cigarettes, uncomplicated 05/17/2022   Noncompliance with treatment plan 05/17/2022   Orthostatic hypotension 05/17/2022   Vitamin D  deficiency 05/17/2022   Elevated brain natriuretic peptide (BNP) level 04/29/2022   Solitary pulmonary nodule 04/26/2022   First degree heart block 04/12/2022   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   COPD (chronic obstructive pulmonary disease) (HCC) 11/03/2021   Aneurysm of infrarenal abdominal aorta (HCC) 11/03/2021   Functional gait abnormality 11/03/2021   Mixed hyperlipidemia 11/03/2021   Moderate protein-calorie malnutrition (HCC) 11/03/2021   Physical deconditioning 11/03/2021   Proteinuria 11/03/2021   Atrophy of pancreas 08/11/2021   CAD (coronary artery disease) 08/03/2021   CKD stage 3b, GFR 30-44 ml/min (HCC) 08/03/2021   Hardening of the aorta (main artery of the heart) (HCC) 08/03/2021   History of adenomatous polyp of colon 06/16/2021   Carotid stenosis, right 02/15/2021   Occlusion and stenosis of right carotid artery 02/15/2021   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 12/06/2019   Incisional hernia 11/15/2019   Benign  prostatic hyperplasia with urinary obstruction 10/29/2018   Chronic combined systolic and diastolic CHF (congestive heart failure) (HCC) 05/09/2018   Incomplete bladder emptying 07/05/2017   Disorder of carotid artery (HCC) 06/25/2017   Orthostatic syncope    Syncope due to orthostatic hypotension 06/24/2017    Cardiac murmur 06/24/2017   Prolonged QT interval 06/24/2017   Basal cell carcinoma of eyelid 05/15/2017   Basal cell carcinoma (BCC) of nostril 05/12/2017   Skin lesion of face 05/03/2017   Other specified disorders of nose and nasal sinuses 05/03/2017   Iron  deficiency anemia 04/04/2017   Essential (primary) hypertension 04/02/2017   Anemia of chronic disease 04/02/2017   Tobacco abuse 04/02/2017   Hypokalemia 04/02/2017   Nasal lesion     Past Surgical History:  Procedure Laterality Date   ANKLE CLOSED REDUCTION Right    open reduction   BASAL CELL CARCINOMA EXCISION  2019   at [facility]   BIOPSY  07/19/2021   Procedure: BIOPSY;  Surgeon: Vinetta Greening, DO;  Location: [facility];  Service: Endoscopy;;   CATARACT EXTRACTION W/PHACO Left 01/23/2023   Procedure: CATARACT EXTRACTION PHACO AND INTRAOCULAR LENS PLACEMENT (IOC);  Surgeon: Tarri Farm, MD;  Location: [facility];  Service: Ophthalmology;  Laterality: Left;  CDE: 23.26   COLONOSCOPY N/A 05/31/2017   Procedure: COLONOSCOPY;  Surgeon: Suzette Espy, MD;  Location: [facility];  Service: Endoscopy;  Laterality: N/A;  2:45pm   COLONOSCOPY WITH PROPOFOL  N/A 07/19/2021   Procedure: COLONOSCOPY WITH PROPOFOL ;  Surgeon: Vinetta Greening, DO;  Location: [facility];  Service: Endoscopy;  Laterality: N/A;  3:00pm, moved up to 9:00   CYSTOSCOPY N/A 10/29/2018   Procedure: CYSTOSCOPY, CLOT EVACUATION;  Surgeon: Adelbert Homans, MD;  Location: [facility];  Service: Urology;  Laterality: N/A;   PARTIAL COLECTOMY Right 08/11/2021   Procedure: PARTIAL COLECTOMY, OPEN RIGHT HEMICOLECTOMY;  Surgeon: Awilda Bogus, MD;  Location: [facility];  Service: General;  Laterality: Right;   POLYPECTOMY  05/31/2017   Procedure: POLYPECTOMY;  Surgeon: Suzette Espy, MD;  [facility]: [facility];  Service: Endoscopy;;   RIGHT/LEFT HEART CATH AND CORONARY ANGIOGRAPHY N/A 10/22/2021   Procedure: RIGHT/LEFT HEART CATH AND CORONARY ANGIOGRAPHY;   Surgeon: Kyra Phy, MD;  Location: [facility];  Service: Cardiovascular;  Laterality: N/A;   TONSILLECTOMY     TRANSCAROTID ARTERY REVASCULARIZATION  Right 02/15/2021   Procedure: RIGHT TRANSCAROTID ARTERY REVASCULARIZATION;  Surgeon: Young Hensen, MD;  Location: [facility];  Service: Vascular;  Laterality: Right;   ULTRASOUND GUIDANCE FOR VASCULAR ACCESS Left 02/15/2021   Procedure: ULTRASOUND GUIDANCE FOR VASCULAR ACCESS;  Surgeon: Young Hensen, MD;  Location: [facility] OR;  Service: Vascular;  Laterality: Left;   XI ROBOTIC ASSISTED SIMPLE PROSTATECTOMY N/A 10/29/2018   Procedure: XI ROBOTIC ASSISTED SIMPLE PROSTATECTOMY;  Surgeon: Marco Severs, MD;  Location: [facility];  Service: Urology;  Laterality: N/A;       Home Medications    Prior to Admission medications   Medication Sig Start Date End Date Taking? Authorizing Provider  amoxicillin -clavulanate (AUGMENTIN ) 875-125 MG tablet Take 1 tablet by mouth every 12 (twelve) hours for 5 days. 06/25/23 06/30/23 Yes Leath-Warren, Belen Bowers, NP  apixaban  (ELIQUIS ) 2.5 MG TABS tablet Take 1 tablet (2.5 mg total) by mouth 2 (two) times daily. 04/21/23  Yes Shah, Pratik D, DO  atorvastatin  (LIPITOR ) 40 MG tablet Take 1 tablet (40 mg total) by mouth every evening. Patient taking differently: Take 40  mg by mouth daily. 08/01/22 02/05/24 Yes Shahmehdi, Constantino Demark, MD  Budeson-Glycopyrrol-Formoterol  (BREZTRI  AEROSPHERE) 160-9-4.8 MCG/ACT AERO Inhale 2 puffs into the lungs 2 (two) times daily.   Yes [provider]  carvedilol  (COREG ) 12.5 MG tablet Take 1 tablet (12.5 mg total) by mouth 2 (two) times daily with a meal. 05/17/23  Yes Tat, Myrtie Atkinson, MD  dicyclomine  (BENTYL ) 20 MG tablet Take 1 tablet (20 mg total) by mouth 2 (two) times daily. 05/31/23  Yes Beatty, Celeste A, PA-C  diltiazem  (CARDIZEM ) 30 MG tablet Take 30 mg by mouth daily as needed (heart rate >110 BPM).   Yes [provider]  guaiFENesin  (ROBITUSSIN) 100  MG/5ML liquid Take 10 mLs by mouth every 6 (six) hours as needed for cough or to loosen phlegm. 06/25/23  Yes Leath-Warren, Belen Bowers, NP  losartan  (COZAAR ) 25 MG tablet Take 1 tablet (25 mg total) by mouth daily. 05/17/23  Yes Tat, Myrtie Atkinson, MD  nitroGLYCERIN  (NITROSTAT ) 0.4 MG SL tablet Place 1 tablet (0.4 mg total) under the tongue every 5 (five) minutes as needed for chest pain. 02/05/23  Yes Merdis Stalling, MD  potassium chloride  SA (KLOR-CON  M) 20 MEQ tablet Take 1 tablet (20 mEq total) by mouth daily. 07/26/22 02/05/24 Yes Shahmehdi, Constantino Demark, MD  predniSONE  (DELTASONE ) 20 MG tablet Take 2 tablets (40 mg total) by mouth daily with breakfast for 5 days. 06/25/23 06/30/23 Yes Leath-Warren, Belen Bowers, NP  torsemide  (DEMADEX ) 20 MG tablet Take 20 mg by mouth 2 (two) times daily.   Yes [provider]  Vericiguat  (VERQUVO ) 5 MG TABS Take 1 tablet (5 mg total) by mouth daily. 06/07/23  Yes Gerard Knight, MD    Family History Family History  Problem Relation Age of Onset   Stroke Father    Cirrhosis Mother    Colon cancer Neg Hx     Social History Social History   Tobacco Use   Smoking status: Former    Current packs/day: 0.00    Average packs/day: 0.3 packs/day for 40.0 years (10.0 ttl pk-yrs)    Types: Cigarettes    Start date: 07/02/1981    Quit date: 07/02/2021    Years since quitting: 1.9    Passive exposure: Past   Smokeless tobacco: Never   Tobacco comments:    Quit on 06/09/21  Vaping Use   Vaping status: Never Used  Substance Use Topics   Alcohol use: Not Currently   Drug use: Never     Allergies   Entresto  [sacubitril -valsartan ] and Sglt2 inhibitors   Review of Systems Review of Systems Per HPI  Physical Exam Triage Vital Signs ED Triage Vitals [06/25/23 1222]  Encounter Vitals Group     BP (!) 158/109     Systolic BP Percentile      Diastolic BP Percentile      Pulse Rate 79     Resp (!) 28     Temp (!) 96.8 F (36 C)     Temp Source Temporal      SpO2 100 %     Weight      Height      Head Circumference      Peak Flow      Pain Score 0     Pain Loc      Pain Education      Exclude from Growth Chart    No data found.  Updated Vital Signs BP (!) 158/109 (BP Location: Right Arm)   Pulse 73  Temp (!) 96.8 F (36 C) (Temporal)   Resp (!) 28   SpO2 98%   Visual Acuity Right Eye Distance:   Left Eye Distance:   Bilateral Distance:    Right Eye Near:   Left Eye Near:    Bilateral Near:     Physical Exam Vitals and nursing note reviewed.  Constitutional:      General: He is not in acute distress.    Appearance: Normal appearance.  HENT:     Head: Normocephalic.  Eyes:     Extraocular Movements: Extraocular movements intact.     Pupils: Pupils are equal, round, and reactive to light.  Cardiovascular:     Rate and Rhythm: Normal rate and regular rhythm.     Pulses: Normal pulses.     Heart sounds: Normal heart sounds.  Pulmonary:     Effort: Pulmonary effort is normal.     Breath sounds: Examination of the right-upper field reveals wheezing. Examination of the left-upper field reveals wheezing. Examination of the right-lower field reveals wheezing. Examination of the left-lower field reveals wheezing. Wheezing present.  Abdominal:     General: Bowel sounds are normal.     Palpations: Abdomen is soft.     Tenderness: There is no abdominal tenderness.  Musculoskeletal:     Cervical back: Normal range of motion.  Skin:    General: Skin is warm and dry.  Neurological:     General: No focal deficit present.     Mental Status: He is alert and oriented to person, place, and time.  Psychiatric:        Mood and Affect: Mood normal.        Behavior: Behavior normal.      UC Treatments / Results  Labs (all labs ordered are listed, but only abnormal results are displayed) Labs Reviewed - No data to display  EKG   Radiology No results found.  Procedures Procedures (including critical care  time)  Medications Ordered in UC Medications  ipratropium-albuterol  (DUONEB) 0.5-2.5 (3) MG/3ML nebulizer solution 3 mL (3 mLs Nebulization Given 06/25/23 1236)  methylPREDNISolone  sodium succinate (SOLU-MEDROL ) 125 mg/2 mL injection 125 mg (125 mg Intramuscular Given 06/25/23 1235)    Initial Impression / Assessment and Plan / UC Course  I have reviewed the triage vital signs and the nursing notes.  Pertinent labs & imaging results that were available during my care of the patient were reviewed by me and considered in my medical decision making (see chart for details).  Patient presents with a 1 day history of cough.  On initial exam, room air sats at 98%, patient does have wheezing noted throughout.  Unable to obtain swab for COVID as patient has moderate facial deformity due to history of head and neck cancer. Unable to perform imaging due to staffing.  DuoNeb and Solu-Medrol  125 mg IM prescribed.  Will cover patient for COPD exacerbation with Augmentin  875/125 mg tablets and prednisone  40 mg daily.  Guaifenesin  100 mg prescribed for patient's cough.  Supportive care recommendations were provided and discussed with the patient to include fluids, rest, over-the-counter analgesics, use of a humidifier during sleep.  Discussed strict ER follow-up precautions with patient.  Patient was advised is recommended that he follow-up with his PCP within the next 7 to 10 days for reevaluation.  Patient was in agreement with this plan of care and verbalizes understanding.  All questions were answered.  Patient stable for discharge.  Final Clinical Impressions(s) / UC Diagnoses   Final  diagnoses:  Wheezing  Shortness of breath  Cough, unspecified type  History of COPD     Discharge Instructions      You were given an injection of Solumedrol 125mg  and a nebulizer treatment. Start the prednisone  on 06/26/23. Take medication as prescribed. Continue your current medications as prescribed. You may take  Tylenol  as needed for pain, fever, or general discomfort. Recommend use of a humidifier in your bedroom at night during sleep and to sleep elevated on pillows while symptoms persist. Go to the emergency department if you experience worsening wheezing, shortness of breath or difficulty breathing. Follow-up with your primary care physician within 7 to 10 days for reevaluation or if symptoms fail to improve. Follow-up as needed.    ED Prescriptions     Medication Sig Dispense Auth. Provider   amoxicillin -clavulanate (AUGMENTIN ) 875-125 MG tablet Take 1 tablet by mouth every 12 (twelve) hours for 5 days. 10 tablet Leath-Warren, Belen Bowers, NP   predniSONE  (DELTASONE ) 20 MG tablet Take 2 tablets (40 mg total) by mouth daily with breakfast for 5 days. 10 tablet Leath-Warren, Belen Bowers, NP   guaiFENesin  (ROBITUSSIN) 100 MG/5ML liquid Take 10 mLs by mouth every 6 (six) hours as needed for cough or to loosen phlegm. 300 mL Leath-Warren, Belen Bowers, NP      PDMP not reviewed this encounter.   Hardy Lia, NP 06/25/23 1303

## 2023-06-28 ENCOUNTER — Encounter (HOSPITAL_COMMUNITY): Payer: Self-pay

## 2023-06-28 ENCOUNTER — Other Ambulatory Visit: Payer: Self-pay

## 2023-06-28 ENCOUNTER — Emergency Department (HOSPITAL_COMMUNITY)

## 2023-06-28 ENCOUNTER — Inpatient Hospital Stay (HOSPITAL_COMMUNITY)
Admission: EM | Admit: 2023-06-28 | Discharge: 2023-07-02 | DRG: 291 | Disposition: A | Discharge status: Home / Self Care | Attending: Family Medicine | Admitting: Family Medicine

## 2023-06-28 DIAGNOSIS — Z79899 Other long term (current) drug therapy: Secondary | ICD-10-CM

## 2023-06-28 DIAGNOSIS — R0602 Shortness of breath: Secondary | ICD-10-CM | POA: Diagnosis not present

## 2023-06-28 DIAGNOSIS — Z888 Allergy status to other drugs, medicaments and biological substances status: Secondary | ICD-10-CM

## 2023-06-28 DIAGNOSIS — I110 Hypertensive heart disease with heart failure: Secondary | ICD-10-CM | POA: Diagnosis not present

## 2023-06-28 DIAGNOSIS — Z7952 Long term (current) use of systemic steroids: Secondary | ICD-10-CM

## 2023-06-28 DIAGNOSIS — I428 Other cardiomyopathies: Secondary | ICD-10-CM | POA: Diagnosis present

## 2023-06-28 DIAGNOSIS — I5043 Acute on chronic combined systolic (congestive) and diastolic (congestive) heart failure: Secondary | ICD-10-CM | POA: Diagnosis not present

## 2023-06-28 DIAGNOSIS — J441 Chronic obstructive pulmonary disease with (acute) exacerbation: Secondary | ICD-10-CM | POA: Diagnosis not present

## 2023-06-28 DIAGNOSIS — Z85828 Personal history of other malignant neoplasm of skin: Secondary | ICD-10-CM

## 2023-06-28 DIAGNOSIS — I10 Essential (primary) hypertension: Secondary | ICD-10-CM | POA: Diagnosis not present

## 2023-06-28 DIAGNOSIS — R0989 Other specified symptoms and signs involving the circulatory and respiratory systems: Secondary | ICD-10-CM | POA: Diagnosis not present

## 2023-06-28 DIAGNOSIS — I509 Heart failure, unspecified: Secondary | ICD-10-CM | POA: Diagnosis not present

## 2023-06-28 DIAGNOSIS — N1832 Chronic kidney disease, stage 3b: Secondary | ICD-10-CM | POA: Diagnosis not present

## 2023-06-28 DIAGNOSIS — I493 Ventricular premature depolarization: Secondary | ICD-10-CM | POA: Diagnosis not present

## 2023-06-28 DIAGNOSIS — Z1152 Encounter for screening for COVID-19: Secondary | ICD-10-CM

## 2023-06-28 DIAGNOSIS — Z8589 Personal history of malignant neoplasm of other organs and systems: Secondary | ICD-10-CM

## 2023-06-28 DIAGNOSIS — I517 Cardiomegaly: Secondary | ICD-10-CM | POA: Diagnosis not present

## 2023-06-28 DIAGNOSIS — E876 Hypokalemia: Secondary | ICD-10-CM | POA: Diagnosis not present

## 2023-06-28 DIAGNOSIS — Z9842 Cataract extraction status, left eye: Secondary | ICD-10-CM

## 2023-06-28 DIAGNOSIS — C44311 Basal cell carcinoma of skin of nose: Secondary | ICD-10-CM | POA: Diagnosis present

## 2023-06-28 DIAGNOSIS — Z91148 Patient's other noncompliance with medication regimen for other reason: Secondary | ICD-10-CM

## 2023-06-28 DIAGNOSIS — I480 Paroxysmal atrial fibrillation: Secondary | ICD-10-CM | POA: Diagnosis not present

## 2023-06-28 DIAGNOSIS — I2510 Atherosclerotic heart disease of native coronary artery without angina pectoris: Secondary | ICD-10-CM | POA: Diagnosis present

## 2023-06-28 DIAGNOSIS — Z9049 Acquired absence of other specified parts of digestive tract: Secondary | ICD-10-CM | POA: Diagnosis not present

## 2023-06-28 DIAGNOSIS — Z720 Tobacco use: Secondary | ICD-10-CM | POA: Diagnosis present

## 2023-06-28 DIAGNOSIS — N189 Chronic kidney disease, unspecified: Secondary | ICD-10-CM

## 2023-06-28 DIAGNOSIS — N179 Acute kidney failure, unspecified: Secondary | ICD-10-CM | POA: Diagnosis present

## 2023-06-28 DIAGNOSIS — I4891 Unspecified atrial fibrillation: Secondary | ICD-10-CM | POA: Diagnosis present

## 2023-06-28 DIAGNOSIS — R059 Cough, unspecified: Secondary | ICD-10-CM | POA: Diagnosis not present

## 2023-06-28 DIAGNOSIS — Z961 Presence of intraocular lens: Secondary | ICD-10-CM | POA: Diagnosis present

## 2023-06-28 DIAGNOSIS — F17210 Nicotine dependence, cigarettes, uncomplicated: Secondary | ICD-10-CM | POA: Diagnosis present

## 2023-06-28 DIAGNOSIS — E782 Mixed hyperlipidemia: Secondary | ICD-10-CM | POA: Diagnosis not present

## 2023-06-28 DIAGNOSIS — T502X6A Underdosing of carbonic-anhydrase inhibitors, benzothiadiazides and other diuretics, initial encounter: Secondary | ICD-10-CM | POA: Diagnosis not present

## 2023-06-28 DIAGNOSIS — I130 Hypertensive heart and chronic kidney disease with heart failure and stage 1 through stage 4 chronic kidney disease, or unspecified chronic kidney disease: Principal | ICD-10-CM | POA: Diagnosis present

## 2023-06-28 DIAGNOSIS — D509 Iron deficiency anemia, unspecified: Secondary | ICD-10-CM | POA: Diagnosis not present

## 2023-06-28 DIAGNOSIS — Z7901 Long term (current) use of anticoagulants: Secondary | ICD-10-CM | POA: Diagnosis not present

## 2023-06-28 DIAGNOSIS — Z981 Arthrodesis status: Secondary | ICD-10-CM

## 2023-06-28 DIAGNOSIS — I5020 Unspecified systolic (congestive) heart failure: Secondary | ICD-10-CM | POA: Diagnosis not present

## 2023-06-28 DIAGNOSIS — Z7951 Long term (current) use of inhaled steroids: Secondary | ICD-10-CM

## 2023-06-28 HISTORY — DX: Chronic kidney disease, unspecified: N18.9

## 2023-06-28 LAB — CBC WITH DIFFERENTIAL/PLATELET
Abs Immature Granulocytes: 0.08 10*3/uL — ABNORMAL HIGH (ref 0.00–0.07)
Basophils Absolute: 0 10*3/uL (ref 0.0–0.1)
Basophils Relative: 0 %
Eosinophils Absolute: 0 10*3/uL (ref 0.0–0.5)
Eosinophils Relative: 0 %
HCT: 43.2 % (ref 39.0–52.0)
Hemoglobin: 13.1 g/dL (ref 13.0–17.0)
Immature Granulocytes: 1 %
Lymphocytes Relative: 9 %
Lymphs Abs: 0.6 10*3/uL — ABNORMAL LOW (ref 0.7–4.0)
MCH: 27.5 pg (ref 26.0–34.0)
MCHC: 30.3 g/dL (ref 30.0–36.0)
MCV: 90.6 fL (ref 80.0–100.0)
Monocytes Absolute: 0.7 10*3/uL (ref 0.1–1.0)
Monocytes Relative: 10 %
Neutro Abs: 5.8 10*3/uL (ref 1.7–7.7)
Neutrophils Relative %: 80 %
Platelets: 202 10*3/uL (ref 150–400)
RBC: 4.77 MIL/uL (ref 4.22–5.81)
RDW: 17 % — ABNORMAL HIGH (ref 11.5–15.5)
WBC: 7.2 10*3/uL (ref 4.0–10.5)
nRBC: 0 % (ref 0.0–0.2)

## 2023-06-28 LAB — RESP PANEL BY RT-PCR (RSV, FLU A&B, COVID)  RVPGX2
Influenza A by PCR: NEGATIVE
Influenza B by PCR: NEGATIVE
Resp Syncytial Virus by PCR: NEGATIVE
SARS Coronavirus 2 by RT PCR: NEGATIVE

## 2023-06-28 LAB — BASIC METABOLIC PANEL WITH GFR
Anion gap: 13 (ref 5–15)
BUN: 43 mg/dL — ABNORMAL HIGH (ref 8–23)
CO2: 22 mmol/L (ref 22–32)
Calcium: 8.6 mg/dL — ABNORMAL LOW (ref 8.9–10.3)
Chloride: 103 mmol/L (ref 98–111)
Creatinine, Ser: 2.41 mg/dL — ABNORMAL HIGH (ref 0.61–1.24)
GFR, Estimated: 28 mL/min — ABNORMAL LOW (ref >60)
Glucose, Bld: 188 mg/dL — ABNORMAL HIGH (ref 70–99)
Potassium: 4 mmol/L (ref 3.5–5.1)
Sodium: 138 mmol/L (ref 135–145)

## 2023-06-28 LAB — BRAIN NATRIURETIC PEPTIDE: B Natriuretic Peptide: >4500 pg/mL — ABNORMAL HIGH (ref 0.0–100.0)

## 2023-06-28 MED ORDER — IPRATROPIUM-ALBUTEROL 0.5-2.5 (3) MG/3ML IN SOLN
3.0000 mL | Freq: Three times a day (TID) | RESPIRATORY_TRACT | Status: AC
  Filled 2023-06-28 (×2): qty 3

## 2023-06-28 MED ORDER — METHYLPREDNISOLONE SODIUM SUCC 125 MG IJ SOLR
60.0000 mg | Freq: Two times a day (BID) | INTRAMUSCULAR | Status: AC
  Administered 2023-06-29 (×2): 60 mg via INTRAVENOUS
  Filled 2023-06-28 (×2): qty 2

## 2023-06-28 MED ORDER — FUROSEMIDE 10 MG/ML IJ SOLN
60.0000 mg | INTRAMUSCULAR | Status: AC
  Administered 2023-06-28: 60 mg via INTRAVENOUS
  Filled 2023-06-28: qty 6

## 2023-06-28 MED ORDER — APIXABAN 2.5 MG PO TABS
2.5000 mg | ORAL_TABLET | Freq: Two times a day (BID) | ORAL | Status: DC
  Administered 2023-06-28: 2.5 mg via ORAL
  Filled 2023-06-28 (×2): qty 1

## 2023-06-28 MED ORDER — DOXYCYCLINE HYCLATE 100 MG PO TABS
100.0000 mg | ORAL_TABLET | Freq: Once | ORAL | Status: AC
  Administered 2023-06-28: 100 mg via ORAL
  Filled 2023-06-28: qty 1

## 2023-06-28 MED ORDER — POLYETHYLENE GLYCOL 3350 17 G PO PACK: 17.0000 g | PACK | Freq: Every day | ORAL | Status: DC | PRN

## 2023-06-28 MED ORDER — IPRATROPIUM-ALBUTEROL 0.5-2.5 (3) MG/3ML IN SOLN
3.0000 mL | RESPIRATORY_TRACT | Status: DC | PRN
  Administered 2023-06-29: 3 mL via RESPIRATORY_TRACT

## 2023-06-28 MED ORDER — SODIUM CHLORIDE 0.9 % IV SOLN
100.0000 mg | Freq: Two times a day (BID) | INTRAVENOUS | Status: DC
  Administered 2023-06-29 – 2023-07-02 (×7): 100 mg via INTRAVENOUS
  Filled 2023-06-28 (×10): qty 100

## 2023-06-28 MED ORDER — CARVEDILOL 12.5 MG PO TABS
12.5000 mg | ORAL_TABLET | Freq: Two times a day (BID) | ORAL | Status: DC
  Administered 2023-06-29: 12.5 mg via ORAL
  Filled 2023-06-28: qty 1

## 2023-06-28 MED ORDER — ALBUTEROL SULFATE (2.5 MG/3ML) 0.083% IN NEBU
5.0000 mg | INHALATION_SOLUTION | Freq: Once | RESPIRATORY_TRACT | Status: AC
  Administered 2023-06-28: 5 mg via RESPIRATORY_TRACT
  Filled 2023-06-28: qty 6

## 2023-06-28 MED ORDER — METHYLPREDNISOLONE SODIUM SUCC 125 MG IJ SOLR
125.0000 mg | Freq: Once | INTRAMUSCULAR | Status: AC
  Administered 2023-06-28: 125 mg via INTRAVENOUS
  Filled 2023-06-28: qty 2

## 2023-06-28 MED ORDER — GUAIFENESIN-DM 100-10 MG/5ML PO SYRP
15.0000 mL | ORAL_SOLUTION | Freq: Three times a day (TID) | ORAL | Status: AC
  Administered 2023-06-28 – 2023-06-29 (×2): 15 mL via ORAL
  Filled 2023-06-28 (×2): qty 15

## 2023-06-28 MED ORDER — ACETAMINOPHEN 325 MG PO TABS
650.0000 mg | ORAL_TABLET | Freq: Four times a day (QID) | ORAL | Status: DC | PRN
  Administered 2023-06-30: 650 mg via ORAL
  Filled 2023-06-28: qty 2

## 2023-06-28 MED ORDER — FUROSEMIDE 10 MG/ML IJ SOLN
40.0000 mg | Freq: Four times a day (QID) | INTRAMUSCULAR | Status: DC
  Administered 2023-06-29 – 2023-06-30 (×5): 40 mg via INTRAVENOUS
  Filled 2023-06-28 (×5): qty 4

## 2023-06-28 MED ORDER — ACETAMINOPHEN 650 MG RE SUPP: 650.0000 mg | Freq: Four times a day (QID) | RECTAL | Status: DC | PRN

## 2023-06-28 MED ORDER — ATORVASTATIN CALCIUM 40 MG PO TABS
40.0000 mg | ORAL_TABLET | Freq: Every evening | ORAL | Status: DC
  Administered 2023-06-28 – 2023-07-01 (×4): 40 mg via ORAL
  Filled 2023-06-28 (×4): qty 1

## 2023-06-28 MED ORDER — BUDESON-GLYCOPYRROL-FORMOTEROL 160-9-4.8 MCG/ACT IN AERO
2.0000 | INHALATION_SPRAY | Freq: Two times a day (BID) | RESPIRATORY_TRACT | Status: DC
  Administered 2023-06-29 (×2): 2 via RESPIRATORY_TRACT
  Filled 2023-06-28 (×2): qty 5.9

## 2023-06-28 MED ORDER — PREDNISONE 20 MG PO TABS
40.0000 mg | ORAL_TABLET | Freq: Every day | ORAL | Status: DC
  Administered 2023-06-30 – 2023-07-02 (×3): 40 mg via ORAL
  Filled 2023-06-28 (×3): qty 2

## 2023-06-28 MED ORDER — IPRATROPIUM-ALBUTEROL 0.5-2.5 (3) MG/3ML IN SOLN
9.0000 mL | Freq: Once | RESPIRATORY_TRACT | Status: AC
  Administered 2023-06-28: 9 mL via RESPIRATORY_TRACT
  Filled 2023-06-28: qty 9

## 2023-06-28 MED ORDER — VERICIGUAT 5 MG PO TABS: 5.0000 mg | ORAL_TABLET | Freq: Every day | ORAL | Status: DC

## 2023-06-28 NOTE — Assessment & Plan Note (Signed)
 CKD stage IIIb/IV.  Creatinine 2.41 today, recent baseline 1.6-1.8, uptrending.

## 2023-06-28 NOTE — Assessment & Plan Note (Addendum)
 Systolic 150s to 161W.  ?? compliance. -Resume carvedilol , - ??  Compliance with diltiazem  - Hold losartan  with renal insufficiency

## 2023-06-28 NOTE — H&P (Signed)
 History and Physical    Michael [name] ZOX:[id number] DOB: November 05, 1955 DOA: 06/28/2023  PCP: [name], MD   Patient coming from: Home  I have personally briefly reviewed patient's old medical records in [facility]  Chief Complaint: Difficulty breathing  HPI: Michael [name] is a 68 y.o. male with medical history significant for systolic and diastolic CHF, atrial fibrillation, COPD, CKD 3, AAA. Patient presented to the ED with complaints of difficulty breathing and cough of 1 week duration.  Denies add of the symptoms at baseline.  He also reports swelling to bilateral lower extremities over the past week.  Denies abdominal bloating.  No chest pain. He supposed to be on torsemide  20 mg twice daily, but tells me on average he takes it 3 days a week because he forgets to take his medication.  He last took it this morning.  Reports compliance with Eliquis .  ED Course: Temperature 98.  Heart rate 90-100.  Respirate rate 17-26.  Blood pressure 158-184.  O2 sats 91 to 96% on room air. Wheezing appreciated in ED. BNP chronically elevated at greater than 4500. COVID influenza RSV negative. Chest x-ray shows vascular congestion and early interstitial edema. IV Solu-Medrol  125 mg given, IV Lasix  60 mg, doxycycline and DuoNebs given. Hospitalist admit for CHF and component of COPD.  Review of Systems: As per HPI all other systems reviewed and negative.  Past Medical History:  Diagnosis Date   Aortic atherosclerosis (HCC) 08/03/2021   Atrial fibrillation (HCC)    CAD (coronary artery disease) 08/03/2021   Cardiac catheterization September 2023 with RCA/RV marginal stenosis managed medically   Cardiomyopathy (HCC)    Carotid artery disease (HCC)    CKD (chronic kidney disease) stage 3, GFR 30-59 ml/min (HCC)    COPD (chronic obstructive pulmonary disease) (HCC)    Essential hypertension    Head and neck cancer (HCC) 2019   Right facial basal cell carcinoma s/p resection with  right partial mastectomy and partal rhinectomy with skin graft (06/13/17)   Iron  deficiency anemia    Mass of colon 08/06/2021   Formatting of this note might be different from the original. Last Assessment & Plan:  Formatting of this note might be different from the original.  Status post right hemicolectomy by Dr. Collene Dawson today 7/12.    -Will check postop CBC and BMP     Urinary retention     Past Surgical History:  Procedure Laterality Date   ANKLE CLOSED REDUCTION Right    open reduction   BASAL CELL CARCINOMA EXCISION  2019   at [facility]   BIOPSY  07/19/2021   Procedure: BIOPSY;  Surgeon: Vinetta Greening, DO;  Location: [facility];  Service: Endoscopy;;   CATARACT EXTRACTION W/PHACO Left 01/23/2023   Procedure: CATARACT EXTRACTION PHACO AND INTRAOCULAR LENS PLACEMENT (IOC);  Surgeon: Tarri Farm, MD;  Location: [facility] ORS;  Service: Ophthalmology;  Laterality: Left;  CDE: 23.26   COLONOSCOPY N/A 05/31/2017   Procedure: COLONOSCOPY;  Surgeon: Suzette Espy, MD;  Location: [facility];  Service: Endoscopy;  Laterality: N/A;  2:45pm   COLONOSCOPY WITH PROPOFOL  N/A 07/19/2021   Procedure: COLONOSCOPY WITH PROPOFOL ;  Surgeon: Vinetta Greening, DO;  Location: [facility];  Service: Endoscopy;  Laterality: N/A;  3:00pm, moved up to 9:00   CYSTOSCOPY N/A 10/29/2018   Procedure: CYSTOSCOPY, CLOT EVACUATION;  Surgeon: Adelbert Homans, MD;  Location: [facility] ORS;  Service: Urology;  Laterality: N/A;   PARTIAL COLECTOMY Right 08/11/2021  Procedure: PARTIAL COLECTOMY, OPEN RIGHT HEMICOLECTOMY;  Surgeon: Awilda Bogus, MD;  Location: [facility];  Service: General;  Laterality: Right;   POLYPECTOMY  05/31/2017   Procedure: POLYPECTOMY;  Surgeon: Suzette Espy, MD;  Location: [facility];  Service: Endoscopy;;   RIGHT/LEFT HEART CATH AND CORONARY ANGIOGRAPHY N/A 10/22/2021   Procedure: RIGHT/LEFT HEART CATH AND CORONARY ANGIOGRAPHY;  Surgeon: Kyra Phy, MD;  Location: [facility];  Service: Cardiovascular;  Laterality: N/A;   TONSILLECTOMY     TRANSCAROTID ARTERY REVASCULARIZATION  Right 02/15/2021   Procedure: RIGHT TRANSCAROTID ARTERY REVASCULARIZATION;  Surgeon: Young Hensen, MD;  Location: [facility] OR;  Service: Vascular;  Laterality: Right;   ULTRASOUND GUIDANCE FOR VASCULAR ACCESS Left 02/15/2021   Procedure: ULTRASOUND GUIDANCE FOR VASCULAR ACCESS;  Surgeon: Young Hensen, MD;  Location: [facility] OR;  Service: Vascular;  Laterality: Left;   XI ROBOTIC ASSISTED SIMPLE PROSTATECTOMY N/A 10/29/2018   Procedure: XI ROBOTIC ASSISTED SIMPLE PROSTATECTOMY;  Surgeon: Marco Severs, MD;  Location: [facility];  Service: Urology;  Laterality: N/A;     reports that he quit smoking about 1 years ago. His smoking use included cigarettes. He started smoking about 42 years ago. He has a 10 pack-year smoking history. He has been exposed to tobacco smoke. He has never used smokeless tobacco. He reports that he does not currently use alcohol. He reports that he does not use drugs.  Allergies  Allergen Reactions   Entresto  [Sacubitril -Valsartan ] Other (See Comments)    Orthostatic Hypotension Per pt, he has no allergies   Sglt2 Inhibitors Other (See Comments)    UTI's Per pt, he has no allergies    Family History  Problem Relation Age of Onset   Stroke Father    Cirrhosis Mother    Colon cancer Neg Hx     Prior to Admission medications   Medication Sig Start Date End Date Taking? Authorizing Provider  amoxicillin -clavulanate (AUGMENTIN ) 875-125 MG tablet Take 1 tablet by mouth every 12 (twelve) hours for 5 days. 06/25/23 06/30/23 Yes Leath-Warren, Belen Bowers, NP  apixaban  (ELIQUIS ) 2.5 MG TABS tablet Take 1 tablet (2.5 mg total) by mouth 2 (two) times daily. 04/21/23  Yes Shah, Pratik D, DO  atorvastatin  (LIPITOR ) 40 MG tablet Take 1 tablet (40 mg total) by mouth every evening. Patient taking differently: Take 40 mg by mouth daily. 08/01/22 02/05/24 Yes Shahmehdi, Constantino Demark, MD  Budeson-Glycopyrrol-Formoterol  (BREZTRI  AEROSPHERE) 160-9-4.8 MCG/ACT AERO Inhale 2 puffs into the lungs 2 (two) times daily.   Yes [provider]  carvedilol  (COREG ) 12.5 MG tablet Take 1 tablet (12.5 mg total) by mouth 2 (two) times daily with a meal. 05/17/23  Yes Tat, Myrtie Atkinson, MD  dicyclomine  (BENTYL ) 20 MG tablet Take 1 tablet (20 mg total) by mouth 2 (two) times daily. 05/31/23  Yes Beatty, Celeste A, PA-C  diltiazem  (CARDIZEM ) 30 MG tablet Take 30 mg by mouth daily as needed (heart rate >110 BPM).   Yes [provider]  losartan  (COZAAR ) 25 MG tablet Take 1 tablet (25 mg total) by mouth daily. 05/17/23  Yes Tat, Myrtie Atkinson, MD  nitroGLYCERIN  (NITROSTAT ) 0.4 MG SL tablet Place 1 tablet (0.4 mg total) under the tongue every 5 (five) minutes as needed for chest pain. 02/05/23  Yes Merdis Stalling, MD  potassium chloride  SA (KLOR-CON  M) 20 MEQ tablet Take 1 tablet (20 mEq total) by mouth daily. 07/26/22 02/05/24 Yes Shahmehdi, Constantino Demark, MD  predniSONE  (DELTASONE ) 20 MG tablet  Take 2 tablets (40 mg total) by mouth daily with breakfast for 5 days. 06/25/23 06/30/23 Yes Leath-Warren, Belen Bowers, NP  torsemide  (DEMADEX ) 20 MG tablet Take 20 mg by mouth 2 (two) times daily.   Yes [provider]  Vericiguat  (VERQUVO ) 5 MG TABS Take 1 tablet (5 mg total) by mouth daily. 06/07/23  Yes Gerard Knight, MD  guaiFENesin  (ROBITUSSIN) 100 MG/5ML liquid Take 10 mLs by mouth every 6 (six) hours as needed for cough or to loosen phlegm. Patient not taking: Reported on 06/28/2023 06/25/23   Hardy Lia, NP    Physical Exam: Vitals:   06/28/23 1430 06/28/23 1445 06/28/23 1500 06/28/23 1515  BP: (!) 187/145 (!) 184/124 (!) 173/128 (!) 176/121  Pulse: 90 100 99 96  Resp: (!) 29 (!) 26 (!) 25 (!) 23  Temp:      TempSrc:      SpO2: 91% 92% 90% 93%  Weight:      Height:        Constitutional: NAD, calm, comfortable Vitals:   06/28/23 1430 06/28/23 1445 06/28/23 1500 06/28/23  1515  BP: (!) 187/145 (!) 184/124 (!) 173/128 (!) 176/121  Pulse: 90 100 99 96  Resp: (!) 29 (!) 26 (!) 25 (!) 23  Temp:      TempSrc:      SpO2: 91% 92% 90% 93%  Weight:      Height:       Eyes: PERRL, lids and conjunctivae normal ENMT: Mucous membranes are moist. Surgical deformity to nose, 2/2 skin cancer. Neck: normal, supple, no masses, no thyromegaly Respiratory: Diffuse expiratory rhonchi, no muscle use, no increased work of breathing Cardiovascular: Regular rate and rhythm, no murmurs / rubs / gallops.  2+ bilateral pitting lower extremity edema to knees .  Extremities warm. Abdomen: no tenderness, no masses palpated. No hepatosplenomegaly.   Musculoskeletal: no clubbing / cyanosis. No joint deformity upper and lower extremities.  Skin: no rashes, lesions, ulcers. No induration Neurologic: Moving extremity spontaneously, speech fluent. Psychiatric: Normal judgment and insight. Alert and oriented x 3. Normal mood.   Labs on Admission: I have personally reviewed following labs and imaging studies  CBC: Recent Labs  Lab 06/28/23 1317  WBC 7.2  NEUTROABS 5.8  HGB 13.1  HCT 43.2  MCV 90.6  PLT 202   Basic Metabolic Panel: Recent Labs  Lab 06/28/23 1317  NA 138  K 4.0  CL 103  CO2 22  GLUCOSE 188*  BUN 43*  CREATININE 2.41*  CALCIUM  8.6*   Radiological Exams on Admission: DG Chest 2 View Result Date: 06/28/2023 CLINICAL DATA:  Shortness of breath, cough EXAM: CHEST - 2 VIEW COMPARISON:  06/15/2023 FINDINGS: Cardiomegaly, vascular congestion. Interstitial prominence could reflect interstitial edema. No effusions or acute bony abnormality. IMPRESSION: Cardiomegaly with vascular congestion and possible early interstitial edema. Electronically Signed   By: Janeece Mechanic M.D.   On: 06/28/2023 15:31   EKG: Independently reviewed.  Atrial fibrillation, rate 98, QTc 461.  LVH.  Assessment/Plan Principal Problem:   Acute on chronic combined systolic (congestive) and  diastolic (congestive) heart failure (HCC) Active Problems:   COPD with acute exacerbation (HCC)   Essential (primary) hypertension   CAD (coronary artery disease)   Atrial fibrillation (HCC)   Tobacco abuse   Acute kidney injury superimposed on CKD (HCC)   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  Assessment and Plan: * Acute on chronic combined systolic (congestive) and diastolic (congestive) heart failure (HCC) Presenting with  dyspnea, bilateral lower extremity pitting edema to knees, BNP chronically elevated at 4500.  Chest x-ray showing vascular congestion and interstitial edema.  Not hypoxic.  Not compliant with torsemide  20 mg twice daily.  Recent echo 04/2023-EF 20 to 25%, grade 2 DD. - IV Lasix  60 mg x 1 given, continue 40 twice daily -Strict input output, daily weights, daily BMP - Continue vericiguat  -Monitor renal function closely - Cardiology consult  COPD with acute exacerbation (HCC) Presents with dyspnea, cough, diffuse expiratory rhonchi exam.  Ongoing tobacco abuse.  COPD exacerbation in the setting of decompensated CHF. -IV Solu-Medrol  125 mg given, continue 60 twice daily -Continue doxycycline -DuoNebs as needed and scheduled - Mucolytics   Acute kidney injury superimposed on CKD (HCC) Baseline CKD stage IIIb.  Creatinine 2.41 today, gradual uptrend from baseline 1.6 to 1.81- 1 month ago.  ? cardiorenal syndrome. -Monitor closely with diuresis - Hold losartan   Atrial fibrillation (HCC) EKG currently showing atrial fibrillation.  Reports compliance with Eliquis . But reports noncompliance with diuretics -Resume Eliquis , carvedilol    Essential (primary) hypertension Systolic 150s to 161W.  ?? compliance. -Resume carvedilol , - ??  Compliance with diltiazem  - Hold losartan  with renal insufficiency   DVT prophylaxis: Eliquis  Code Status: Full code, confirmed with patient at bedside Family Communication: None at bedside Disposition Plan: ~/> 2  days Consults called: Cardiology Admission status: Inpt tele I certify that at the point of admission it is my clinical judgment that the patient will require inpatient hospital care spanning beyond 2 midnights from the point of admission due to high intensity of service, high risk for further deterioration and high frequency of surveillance required.   Author: Pati Bonine, MD 06/28/2023 7:10 PM  For on call review [email or web address].

## 2023-06-28 NOTE — Assessment & Plan Note (Signed)
 Presents with dyspnea, cough, diffuse expiratory rhonchi exam.  Ongoing tobacco abuse.  COPD exacerbation in the setting of decompensated CHF. -IV Solu-Medrol  125 mg given, continue 60 twice daily -Continue doxycycline -DuoNebs as needed and scheduled - Mucolytics

## 2023-06-28 NOTE — ED Triage Notes (Signed)
 Pt arrived via POV c/o recurrent SOB, and productive cough over 1 week. Pt reports using breathing treatments at home PTA w/o relief.

## 2023-06-28 NOTE — Assessment & Plan Note (Addendum)
 Baseline CKD stage IIIb.  Creatinine 2.41 today, gradual uptrend from baseline 1.6 to 1.81- 1 month ago.  ? cardiorenal syndrome. -Monitor closely with diuresis - Hold losartan 

## 2023-06-28 NOTE — Assessment & Plan Note (Addendum)
 EKG currently showing atrial fibrillation.  Reports compliance with Eliquis . But reports noncompliance with diuretics -Resume Eliquis , carvedilol 

## 2023-06-28 NOTE — ED Provider Notes (Signed)
 [place] EMERGENCY DEPARTMENT AT [facility] Provider Note   CSN: [id number] Arrival date & time: 06/28/23  1156     History  Chief Complaint  Patient presents with   Shortness of Breath    Michael [name] is a 68 y.o. male.  68 year old male with a history of CHF, COPD, and head neck cancer status post resection with partial rhinectomy who presents emergency department with shortness of breath and cough.  Over the past week has had shortness of breath and cough that is productive.  No fevers.  Has been trying his inhalers at home without relief.  Does have lower extremity swelling but reports that it is at baseline.  Denies any pain.       Home Medications Prior to Admission medications   Medication Sig Start Date End Date Taking? Authorizing Provider  amoxicillin -clavulanate (AUGMENTIN ) 875-125 MG tablet Take 1 tablet by mouth every 12 (twelve) hours for 5 days. 06/25/23 06/30/23 Yes Leath-Warren, Belen Bowers, NP  apixaban  (ELIQUIS ) 2.5 MG TABS tablet Take 1 tablet (2.5 mg total) by mouth 2 (two) times daily. 04/21/23  Yes Shah, Pratik D, DO  atorvastatin  (LIPITOR ) 40 MG tablet Take 1 tablet (40 mg total) by mouth every evening. Patient taking differently: Take 40 mg by mouth daily. 08/01/22 02/05/24 Yes Shahmehdi, Constantino Demark, MD  Budeson-Glycopyrrol-Formoterol  (BREZTRI  AEROSPHERE) 160-9-4.8 MCG/ACT AERO Inhale 2 puffs into the lungs 2 (two) times daily.   Yes [provider]  carvedilol  (COREG ) 12.5 MG tablet Take 1 tablet (12.5 mg total) by mouth 2 (two) times daily with a meal. 05/17/23  Yes Tat, Myrtie Atkinson, MD  dicyclomine  (BENTYL ) 20 MG tablet Take 1 tablet (20 mg total) by mouth 2 (two) times daily. 05/31/23  Yes Beatty, Celeste A, PA-C  diltiazem  (CARDIZEM ) 30 MG tablet Take 30 mg by mouth daily as needed (heart rate >110 BPM).   Yes [provider]  losartan  (COZAAR ) 25 MG tablet Take 1 tablet (25 mg total) by mouth daily. 05/17/23  Yes Tat, Myrtie Atkinson, MD   nitroGLYCERIN  (NITROSTAT ) 0.4 MG SL tablet Place 1 tablet (0.4 mg total) under the tongue every 5 (five) minutes as needed for chest pain. 02/05/23  Yes Merdis Stalling, MD  potassium chloride  SA (KLOR-CON  M) 20 MEQ tablet Take 1 tablet (20 mEq total) by mouth daily. 07/26/22 02/05/24 Yes Shahmehdi, Constantino Demark, MD  predniSONE  (DELTASONE ) 20 MG tablet Take 2 tablets (40 mg total) by mouth daily with breakfast for 5 days. 06/25/23 06/30/23 Yes Leath-Warren, Belen Bowers, NP  torsemide  (DEMADEX ) 20 MG tablet Take 20 mg by mouth 2 (two) times daily.   Yes [provider]  Vericiguat  (VERQUVO ) 5 MG TABS Take 1 tablet (5 mg total) by mouth daily. 06/07/23  Yes Gerard Knight, MD  guaiFENesin  (ROBITUSSIN) 100 MG/5ML liquid Take 10 mLs by mouth every 6 (six) hours as needed for cough or to loosen phlegm. Patient not taking: Reported on 06/28/2023 06/25/23   Leath-Warren, Belen Bowers, NP      Allergies    Entresto  [sacubitril -valsartan ] and Sglt2 inhibitors    Review of Systems   Review of Systems  Physical Exam Updated Vital Signs BP (!) 164/96   Pulse 86   Temp 98.1 F (36.7 C) (Oral)   Resp 20   Ht 6' (1.829 m)   Wt 64.8 kg   SpO2 95%   BMI 19.38 kg/m  Physical Exam Vitals and nursing note reviewed.  Constitutional:      General: He  is not in acute distress.    Appearance: He is well-developed.  HENT:     Head: Normocephalic and atraumatic.     Right Ear: External ear normal.     Left Ear: External ear normal.     Nose: Nose normal.  Eyes:     Extraocular Movements: Extraocular movements intact.     Conjunctiva/sclera: Conjunctivae normal.     Pupils: Pupils are equal, round, and reactive to light.  Cardiovascular:     Rate and Rhythm: Normal rate and regular rhythm.     Heart sounds: Normal heart sounds.  Pulmonary:     Effort: Pulmonary effort is normal. No respiratory distress.     Breath sounds: Wheezing present.  Musculoskeletal:     Cervical back: Normal range of motion  and neck supple.     Right lower leg: Edema (2+) present.     Left lower leg: Edema (2+) present.  Skin:    General: Skin is warm and dry.  Neurological:     Mental Status: He is alert. Mental status is at baseline.  Psychiatric:        Mood and Affect: Mood normal.        Behavior: Behavior normal.     ED Results / Procedures / Treatments   Labs (all labs ordered are listed, but only abnormal results are displayed) Labs Reviewed  BRAIN NATRIURETIC PEPTIDE - Abnormal; Notable for the following components:      Result Value   B Natriuretic Peptide >4,500.0 (*)    All other components within normal limits  CBC WITH DIFFERENTIAL/PLATELET - Abnormal; Notable for the following components:   RDW 17.0 (*)    Lymphs Abs 0.6 (*)    Abs Immature Granulocytes 0.08 (*)    All other components within normal limits  BASIC METABOLIC PANEL WITH GFR - Abnormal; Notable for the following components:   Glucose, Bld 188 (*)    BUN 43 (*)    Creatinine, Ser 2.41 (*)    Calcium  8.6 (*)    GFR, Estimated 28 (*)    All other components within normal limits  RESP PANEL BY RT-PCR (RSV, FLU A&B, COVID)  [id number]  BASIC METABOLIC PANEL WITH GFR    EKG EKG Interpretation Date/Time:  Wednesday Jun 28 2023 12:58:11 EDT Ventricular Rate:  98 PR Interval:    QRS Duration:  127 QT Interval:  361 QTC Calculation: 461 R Axis:   95  Text Interpretation: Atrial fibrillation Anterior infarct, old Nonspecific T abnormalities, lateral leads Confirmed by Shyrl Doyne [phone]) on 06/28/2023 3:39:03 PM  Radiology DG Chest 2 View Result Date: 06/28/2023 CLINICAL DATA:  Shortness of breath, cough EXAM: CHEST - 2 VIEW COMPARISON:  06/15/2023 FINDINGS: Cardiomegaly, vascular congestion. Interstitial prominence could reflect interstitial edema. No effusions or acute bony abnormality. IMPRESSION: Cardiomegaly with vascular congestion and possible early interstitial edema. Electronically Signed   By: Janeece Mechanic  M.D.   On: 06/28/2023 15:31    Procedures Procedures    Medications Ordered in ED Medications  apixaban  (ELIQUIS ) tablet 2.5 mg (2.5 mg Oral Given 06/28/23 2040)  atorvastatin  (LIPITOR ) tablet 40 mg (40 mg Oral Given 06/28/23 2040)  budesonide -glycopyrrolate -formoterol  (BREZTRI ) 160-9-4.8 MCG/ACT inhaler 2 puff (has no administration in time range)  carvedilol  (COREG ) tablet 12.5 mg (has no administration in time range)  Vericiguat  TABS 5 mg (has no administration in time range)  furosemide  (LASIX ) injection 40 mg (has no administration in time range)  acetaminophen  (TYLENOL ) tablet 650 mg (has  no administration in time range)    Or  acetaminophen  (TYLENOL ) suppository 650 mg (has no administration in time range)  polyethylene glycol (MIRALAX  / GLYCOLAX ) packet 17 g (has no administration in time range)  methylPREDNISolone  sodium succinate (SOLU-MEDROL ) 125 mg/2 mL injection 60 mg (has no administration in time range)    Followed by  predniSONE  (DELTASONE ) tablet 40 mg (has no administration in time range)  ipratropium-albuterol  (DUONEB) 0.5-2.5 (3) MG/3ML nebulizer solution 3 mL (has no administration in time range)  ipratropium-albuterol  (DUONEB) 0.5-2.5 (3) MG/3ML nebulizer solution 3 mL (has no administration in time range)  guaiFENesin -dextromethorphan  (ROBITUSSIN DM) 100-10 MG/5ML syrup 15 mL (15 mLs Oral Given 06/28/23 2040)  doxycycline (VIBRAMYCIN) 100 mg in sodium chloride  0.9 % 250 mL IVPB (has no administration in time range)  methylPREDNISolone  sodium succinate (SOLU-MEDROL ) 125 mg/2 mL injection 125 mg (125 mg Intravenous Given 06/28/23 1327)  doxycycline (VIBRA-TABS) tablet 100 mg (100 mg Oral Given 06/28/23 1326)  ipratropium-albuterol  (DUONEB) 0.5-2.5 (3) MG/3ML nebulizer solution 9 mL (9 mLs Nebulization Given 06/28/23 1336)  furosemide  (LASIX ) injection 60 mg (60 mg Intravenous Given 06/28/23 1555)  albuterol  (PROVENTIL ) (2.5 MG/3ML) 0.083% nebulizer solution 5 mg (5 mg  Nebulization Given 06/28/23 1649)    ED Course/ Medical Decision Making/ A&P Clinical Course as of 06/29/23 0002  Wed Jun 28, 2023  1445 Creatinine(!): 2.41 Baseline 1.6 [RP]  1502 232 ml bladder scan [RP]  1558 Dr Quintella Buck to evaluate the patient for admission [RP]    Clinical Course User Index [RP] Ninetta Basket, MD                                 Medical Decision Making Amount and/or Complexity of Data Reviewed Labs: ordered. Decision-making details documented in ED Course. Radiology: ordered.  Risk Prescription drug management. Decision regarding hospitalization.   Lazer Wollard is a 68 y.o. male with comorbidities that complicate the patient evaluation including CHF, COPD, and head neck cancer status post resection with partial rhinectomy who presents emergency department with shortness of breath and cough.   Initial Ddx:  CHF, COPD, URI, pneumonia, PE  MDM/Course:  Patient presents emergency department shortness of breath and cough for several days.  On exam does have some lower extremity swelling as well as wheezing and rhonchi.  Had a chest x-ray which also shows signs of heart failure and has a BNP that is undetectably high.  Lab work shows that he has acute on chronic kidney failure.  Upon re-evaluation after nebulizer treatment and steroids patient was still feeling quite dyspneic.  Given his worsening renal function the fact that he may be in heart failure with his COPD exacerbation we will go ahead and admit to the hospital for diuresis and monitoring of his kidney function.  Suspect that he may have cardiorenal syndrome that is starting to develop.  Of note, did consider PE but due to the fact that he is already anticoagulated with Eliquis  and given the other explanation for his symptoms feels less likely.  This patient presents to the ED for concern of complaints listed in HPI, this involves an extensive number of treatment options, and is a complaint that  carries with it a high risk of complications and morbidity. Disposition including potential need for admission considered.   Dispo: Admit to Floor  Records reviewed Outpatient Clinic Notes The following labs were independently interpreted: Chemistry and show AKI I independently reviewed  the following imaging with scope of interpretation limited to determining acute life threatening conditions related to emergency care: Chest x-ray and agree with the radiologist interpretation with the following exceptions: none I personally reviewed and interpreted cardiac monitoring: normal sinus rhythm  I personally reviewed and interpreted the pt's EKG: see above for interpretation  I have reviewed the patients home medications and made adjustments as needed Consults: Hospitalist Social Determinants of health:  Geriatric  Portions of this note were generated with Scientist, clinical (histocompatibility and immunogenetics). Dictation errors may occur despite best attempts at proofreading.     Final Clinical Impression(s) / ED Diagnoses Final diagnoses:  COPD exacerbation (HCC)  Acute on chronic congestive heart failure, unspecified heart failure type (HCC)  AKI (acute kidney injury) [facility])    Rx / DC Orders ED Discharge Orders     None         Ninetta Basket, MD 06/29/23 0002

## 2023-06-28 NOTE — Assessment & Plan Note (Addendum)
 Presenting with dyspnea, bilateral lower extremity pitting edema to knees, BNP chronically elevated at 4500.  Chest x-ray showing vascular congestion and interstitial edema.  Not hypoxic.  Not compliant with torsemide  20 mg twice daily.  Recent echo 04/2023-EF 20 to 25%, grade 2 DD. - IV Lasix  60 mg x 1 given, continue 40 twice daily -Strict input output, daily weights, daily BMP - Continue vericiguat  -Monitor renal function closely - Cardiology consult

## 2023-06-29 DIAGNOSIS — I5020 Unspecified systolic (congestive) heart failure: Secondary | ICD-10-CM | POA: Diagnosis not present

## 2023-06-29 DIAGNOSIS — I5043 Acute on chronic combined systolic (congestive) and diastolic (congestive) heart failure: Secondary | ICD-10-CM | POA: Diagnosis not present

## 2023-06-29 LAB — BASIC METABOLIC PANEL WITH GFR
Anion gap: 13 (ref 5–15)
BUN: 46 mg/dL — ABNORMAL HIGH (ref 8–23)
CO2: 23 mmol/L (ref 22–32)
Calcium: 8.2 mg/dL — ABNORMAL LOW (ref 8.9–10.3)
Chloride: 106 mmol/L (ref 98–111)
Creatinine, Ser: 2.19 mg/dL — ABNORMAL HIGH (ref 0.61–1.24)
GFR, Estimated: 32 mL/min — ABNORMAL LOW (ref >60)
Glucose, Bld: 183 mg/dL — ABNORMAL HIGH (ref 70–99)
Potassium: 3.2 mmol/L — ABNORMAL LOW (ref 3.5–5.1)
Sodium: 142 mmol/L (ref 135–145)

## 2023-06-29 MED ORDER — POTASSIUM CHLORIDE CRYS ER 20 MEQ PO TBCR
40.0000 meq | EXTENDED_RELEASE_TABLET | ORAL | Status: AC
  Administered 2023-06-29: 40 meq via ORAL
  Filled 2023-06-29: qty 2

## 2023-06-29 MED ORDER — APIXABAN 5 MG PO TABS
5.0000 mg | ORAL_TABLET | Freq: Two times a day (BID) | ORAL | Status: DC
  Administered 2023-06-29 – 2023-07-02 (×7): 5 mg via ORAL
  Filled 2023-06-29 (×6): qty 1

## 2023-06-29 MED ORDER — BUDESON-GLYCOPYRROL-FORMOTEROL 160-9-4.8 MCG/ACT IN AERO
2.0000 | INHALATION_SPRAY | Freq: Two times a day (BID) | RESPIRATORY_TRACT | Status: DC
  Administered 2023-06-30 – 2023-07-02 (×5): 2 via RESPIRATORY_TRACT
  Filled 2023-06-29: qty 5.9

## 2023-06-29 MED ORDER — CARVEDILOL 3.125 MG PO TABS
18.7500 mg | ORAL_TABLET | Freq: Two times a day (BID) | ORAL | Status: DC
  Administered 2023-06-29 – 2023-06-30 (×2): 18.75 mg via ORAL
  Filled 2023-06-29 (×2): qty 2

## 2023-06-29 MED ORDER — NICOTINE 21 MG/24HR TD PT24
21.0000 mg | MEDICATED_PATCH | Freq: Every day | TRANSDERMAL | Status: DC
  Administered 2023-06-30 – 2023-07-01 (×2): 21 mg via TRANSDERMAL
  Filled 2023-06-29 (×3): qty 1

## 2023-06-29 NOTE — Care Management Obs Status (Signed)
 MEDICARE OBSERVATION STATUS NOTIFICATION   Patient Details  Name: Michael [name] MRN: [id number] Date of Birth: 1955-02-09   Medicare Observation Status Notification Given:  Yes    Geraldina Klinefelter, RN 06/29/2023, 2:53 PM

## 2023-06-29 NOTE — Progress Notes (Signed)
 PROGRESS NOTE   Michael [name], is a 68 y.o. male, DOB - 1955/08/06, ZOX:[id number]  Admit date - 06/28/2023   Admitting Physician Michael Bonine, MD  Outpatient Primary MD for the patient is Michael Bickers, MD  LOS - 0  Chief Complaint  Patient presents with   Shortness of Breath      Brief Narrative:  68 y.o. male with medical history significant for combined systolic and diastolic CHF, atrial fibrillation, COPD, CKD 3, AAA admitted on 06/28/2023 with acute CHF exacerbation in the setting of noncompliance with diuretic therapy    -Assessment and Plan: 1) Acute on chronic combined systolic (congestive) and diastolic CHF -Clinically, radiologically and per lab values patient was in CHF exacerbation on admission  - Admits to noncompliance with medications and lifestyle -Recent echo 04/2023-EF 20 to 25%, grade 2 DD. -IV Lasix  adjusted to every 6 hours 40 mg for now -- Continue vericiguat  - Cardiology consult appreciated - Weight is inaccurate (142.8 >>153) = Dyspnea persist, clinically remains volume overloaded - Continue Coreg  -Losartan  on hold due to concerns of AKI - Monitor daily weight and fluid input and output monitoring as well as respiratory  2)COPD with acute exacerbation (HCC) -Received IV Solu-Medrol --addition to oral prednisone , be judicious with steroids due to volume overload as above #1 - Continue doxycycline, bronchodilators and mucolytics  3)PAFib--continue Coreg  for rate control -Continue Eliquis  for stroke prophylaxis  4)AKI----acute kidney injury on CKD stage - 3B -  creatinine on admission= 2.41   ,  baseline creatinine =1.6 to 2.0   ,  creatinine is now= trending down with diuresis,  -Continue to hold losartan  --renally adjust medications, avoid nephrotoxic agents / dehydration  / hypotension   5)Tobacco abuse - Smoking cessation advised, give nicotine  patch   6)Right Carotid stenosis -He sees  Dr. Ethelle Herb fields -05/12/2017 carotid ultrasound  shows 80-99% right carotid stenosis - right TCAR in 01/2021 and recent dopplers in 11/2022 showed no hemodynamically significant stenosis along the RICA but was noted to have a moderate to large amount of left-sided plaque which is similar to prior imaging  -Continue Eliquis  and atorvastatin    7)Basal cell carcinoma of nasolabial fold -06/06/2017--right partial rhinectomy, resection of left intranasal lesion, skin graft @[facility] -Margins were clear on pathology    8)Iron  deficiency anemia/Advanced colonic adenoma -The patient had colonoscopy 05/31/2017--Dr. Rourk -He was noted to have numerous polyps as well as a polypoid mass in the hepatic flexure to the cecum area -pathology of mass--tubular adenoma -He was referred to general surgery, Dr. Hillman Luck for hemicolectomy -He has been lost to follow-up -noncompliant with po iron    9)Mixed hyperlipidemia/CAD -Cath in 10/2021 showed mild to moderate nonobstructive CAD with medical management recommended. -continue coreg , atorvastatin   10) medication and Lifestyle noncompliance--- May benefit from home health services to improve compliance and reduce readmission rate    Status is: Inpatient   Disposition: The patient is from: Home              Anticipated d/c is to: Home              Anticipated d/c date is: 2 days              Patient currently is not medically stable to d/c. Barriers: Not Clinically Stable-   Code Status :  -  Code Status: Full Code   Family Communication:    NA (patient is alert, awake and coherent)   DVT Prophylaxis  :   - SCDs  apixaban  (ELIQUIS ) tablet 5 mg   Lab Results  Component Value Date   PLT 202 06/28/2023    Inpatient Medications  Scheduled Meds:  apixaban   5 mg Oral BID   atorvastatin   40 mg Oral QPM   budesonide -glycopyrrolate -formoterol   2 puff Inhalation BID   carvedilol   18.75 mg Oral BID WC   furosemide   40 mg Intravenous Q6H   guaiFENesin -dextromethorphan   15 mL Oral Q8H    methylPREDNISolone  (SOLU-MEDROL ) injection  60 mg Intravenous Q12H   Followed by   Michael [name] ON 06/30/2023] predniSONE   40 mg Oral Q breakfast   Vericiguat   5 mg Oral Daily   Continuous Infusions:  doxycycline (VIBRAMYCIN) IV 100 mg (06/29/23 0957)   PRN Meds:.acetaminophen  **OR** acetaminophen , ipratropium-albuterol , polyethylene glycol   Anti-infectives (From admission, onward)    Start     Dose/Rate Route Frequency Ordered Stop   06/29/23 0800  doxycycline (VIBRAMYCIN) 100 mg in sodium chloride  0.9 % 250 mL IVPB        100 mg 125 mL/hr over 120 Minutes Intravenous Every 12 hours 06/28/23 1909     06/28/23 1330  doxycycline (VIBRA-TABS) tablet 100 mg        100 mg Oral  Once 06/28/23 1317 06/28/23 1326         Subjective: Michael [name] today has no fevers, no emesis,  No chest pain,   - Dyspnea persist -Voiding well   Objective: Vitals:   06/29/23 0521 06/29/23 0847 06/29/23 0852 06/29/23 1208  BP: 122/88   (!) 147/90  Pulse: 91   81  Resp: 20     Temp: 97.7 F (36.5 C)   97.9 F (36.6 C)  TempSrc: Oral   Oral  SpO2: 96% 95% 95% 95%  Weight:      Height:        Intake/Output Summary (Last 24 hours) at 06/29/2023 1854 Last data filed at 06/29/2023 0517 Gross per 24 hour  Intake 480 ml  Output --  Net 480 ml   Filed Weights   06/28/23 1209 06/29/23 0500  Weight: 64.8 kg 69.6 kg    Physical Exam  Gen:- Awake Alert,  in no apparent distress  HEENT:-Facial deformities due to surgical resection of basal cell carcinoma of nasolabial fold Neck-Supple Neck, +ve JVD,.  Lungs-fair air movement, few scattered wheezes bilaterally CV- S1, S2 normal, regular  Abd-  +ve B.Sounds, Abd Soft, No tenderness,    Extremity/Skin:- +ve  edema, pedal pulses present  Psych-affect is appropriate, oriented x3 Neuro-generalized weakness, no new focal deficits, no tremors  Data Reviewed: I have personally reviewed following labs and imaging studies  CBC: Recent Labs  Lab  06/28/23 1317  WBC 7.2  NEUTROABS 5.8  HGB 13.1  HCT 43.2  MCV 90.6  PLT 202   Basic Metabolic Panel: Recent Labs  Lab 06/28/23 1317 06/29/23 0419  NA 138 142  K 4.0 3.2*  CL 103 106  CO2 22 23  GLUCOSE 188* 183*  BUN 43* 46*  CREATININE 2.41* 2.19*  CALCIUM  8.6* 8.2*   GFR: Estimated Creatinine Clearance: 31.3 mL/min (A) (by C-G formula based on SCr of 2.19 mg/dL (H)).  Recent Results (from the past 240 hours)  Resp panel by RT-PCR (RSV, Flu A&B, Covid) Anterior Nasal Swab     Status: None   Collection Time: 06/28/23  1:17 PM   Specimen: Anterior Nasal Swab  Result Value Ref Range Status   SARS Coronavirus 2 by RT PCR NEGATIVE NEGATIVE Final  Comment: (NOTE) SARS-CoV-2 target nucleic acids are NOT DETECTED.  The SARS-CoV-2 RNA is generally detectable in upper respiratory specimens during the acute phase of infection. The lowest concentration of SARS-CoV-2 viral copies this assay can detect is 138 copies/mL. A negative result does not preclude SARS-Cov-2 infection and should not be used as the sole basis for treatment or other patient management decisions. A negative result may occur with  improper specimen collection/handling, submission of specimen other than nasopharyngeal swab, presence of viral mutation(s) within the areas targeted by this assay, and inadequate number of viral copies(<138 copies/mL). A negative result must be combined with clinical observations, patient history, and epidemiological information. The expected result is Negative.  Fact Sheet for Patients:  BloggerCourse.com  Fact Sheet for Healthcare Providers:  SeriousBroker.it  This test is no t yet approved or cleared by the [place]  FDA and  has been authorized for detection and/or diagnosis of SARS-CoV-2 by FDA under an Emergency Use Authorization (EUA). This EUA will remain  in effect (meaning this test can be used) for the  duration of the COVID-19 declaration under Section 564(b)(1) of the Act, 21 U.S.C.section 360bbb-3(b)(1), unless the authorization is terminated  or revoked sooner.       Influenza A by PCR NEGATIVE NEGATIVE Final   Influenza B by PCR NEGATIVE NEGATIVE Final    Comment: (NOTE) The Xpert Xpress SARS-CoV-2/FLU/RSV plus assay is intended as an aid in the diagnosis of influenza from Nasopharyngeal swab specimens and should not be used as a sole basis for treatment. Nasal washings and aspirates are unacceptable for Xpert Xpress SARS-CoV-2/FLU/RSV testing.  Fact Sheet for Patients: BloggerCourse.com  Fact Sheet for Healthcare Providers: SeriousBroker.it  This test is not yet approved or cleared by the [place]  FDA and has been authorized for detection and/or diagnosis of SARS-CoV-2 by FDA under an Emergency Use Authorization (EUA). This EUA will remain in effect (meaning this test can be used) for the duration of the COVID-19 declaration under Section 564(b)(1) of the Act, 21 U.S.C. section 360bbb-3(b)(1), unless the authorization is terminated or revoked.     Resp Syncytial Virus by PCR NEGATIVE NEGATIVE Final    Comment: (NOTE) Fact Sheet for Patients: BloggerCourse.com  Fact Sheet for Healthcare Providers: SeriousBroker.it  This test is not yet approved or cleared by the [place]  FDA and has been authorized for detection and/or diagnosis of SARS-CoV-2 by FDA under an Emergency Use Authorization (EUA). This EUA will remain in effect (meaning this test can be used) for the duration of the COVID-19 declaration under Section 564(b)(1) of the Act, 21 U.S.C. section 360bbb-3(b)(1), unless the authorization is terminated or revoked.  Performed at [facility], [address], [place], [place] [address]       Radiology Studies: DG Chest 2 View Result Date:  06/28/2023 CLINICAL DATA:  Shortness of breath, cough EXAM: CHEST - 2 VIEW COMPARISON:  06/15/2023 FINDINGS: Cardiomegaly, vascular congestion. Interstitial prominence could reflect interstitial edema. No effusions or acute bony abnormality. IMPRESSION: Cardiomegaly with vascular congestion and possible early interstitial edema. Electronically Signed   By: Janeece Mechanic M.D.   On: 06/28/2023 15:31     Scheduled Meds:  apixaban   5 mg Oral BID   atorvastatin   40 mg Oral QPM   budesonide -glycopyrrolate -formoterol   2 puff Inhalation BID   carvedilol   18.75 mg Oral BID WC   furosemide   40 mg Intravenous Q6H   guaiFENesin -dextromethorphan   15 mL Oral Q8H   methylPREDNISolone  (SOLU-MEDROL ) injection  60 mg Intravenous Q12H  Followed by   Michael [name] ON 06/30/2023] predniSONE   40 mg Oral Q breakfast   Vericiguat   5 mg Oral Daily   Continuous Infusions:  doxycycline  (VIBRAMYCIN ) IV 100 mg (06/29/23 0957)     LOS: 0 days    Colin Dawley M.D on 06/29/2023 at 6:54 PM  Go to [email or web address] - for contact info  [facility] - Office  [phone]  If 7PM-7AM, please contact night-coverage [email or web address] 06/29/2023, 6:54 PM

## 2023-06-29 NOTE — Plan of Care (Signed)

## 2023-06-29 NOTE — Progress Notes (Signed)
 Dr. Elyse Hand made aware of eight beat run of vtach.

## 2023-06-29 NOTE — Consult Note (Addendum)
 Cardiology Consultation   Patient ID: Michael [name] MRN: [id number]; DOB: 1955-09-21  Admit date: 06/28/2023 Date of Consult: 06/29/2023  PCP:  [name], MD   [facility] Providers Cardiologist:  Teddie Favre, MD   {  Patient Profile:   Michael [name] is a 68 y.o. male with a hx of HFrEF/Nonischemic cardiomyopathy (04/2023: EF 20-25% with cath 10/2021 revealed mid RCA 70% and acute MRG 80%, which recommended DAPT x 1 year), mild to moderate nonobstructive CAD (Cath 2023), paroxysmal A-fib, infrarenal AAA (4.1 cm, Followed by [facility]), carotid artery disease (s/p right TCAR 2023), CKD stage IIIb, COPD who is being seen 06/29/2023 for the evaluation of HF at the request of Dr. Quintella Buck.  History of Present Illness:   Mr. Stepanek was last seen in heart care OV with Dr. Londa Rival on 06/07/2023 for follow-up.  At that time reported no increase in dyspnea on being on NYHA class II-III and denied any edema, orthopnea, or PND.  Noted history of medication compliance issues over time, however, patient reported regular compliance at that time.  Verquvo  was increased to 5 mg daily.  Continued on Coreg  12.5 mg twice daily, Losartan  25 mg daily, Torsemide  20 mg twice daily, KCl 20 mEq daily, Lipitor  40 mg daily, NTG as needed, Eliquis  2.5 mg BID.   Presented to [facility] ED with concerns of dyspnea &  cough x 1 week as well as LE edema.  In ED, HR 90s to 100s, RR 17-26, SBP 150s to 180s, O2 91 to 96% on RA, and wheezing on exam. Negative respiratory panel.  K 4, CR 2.41 improved to 2.19, BNP > 4500 (same since 07/2022), CBC WNL.  EKG: NSR, HR 98, prolonged PR interval 280 ms, chronic Q waves in anterior leads, chronic nonspecific T wave changes. CXR showed vascular congestion and early interstitial edema.   ED Treated with Solu-Medrol , IV Lasix  60 mg x1 then continued on 40 Mg twice daily, ABX and DuoNebs.  Admitted for CHF and COPD.   On interview, patient reported progressively worsening of  SOB, LE edema and cough x 1 week.  Previously SOB only present with exertion but now occurs at rest and not relieved at rest.  Denied any orthopnea, PND, chest pain, dizziness, or syncope. denied any recent illnesses or fever.  Reported poor urine output approximately x 1/day, unchanged by Lasix . He is supposed to take torsemide  20 mg twice daily but admits to only taking 3 days a week because he forgets to take his medication and diuretics do not help him.  However, noted compliance with all other medications.  Reports that he lives with someone.  Noted mainly home-cooked meal with low-sodium.  Drinks about 2 bottles of water , 2 bottles of tea, and 2 bottles of soda daily.  Reported smoking 3 cigs/day x 40 years.  Denied any EtOH or drugs.   Past Medical History:  Diagnosis Date   Aortic atherosclerosis (HCC) 08/03/2021   Atrial fibrillation (HCC)    CAD (coronary artery disease) 08/03/2021   Cardiac catheterization September 2023 with RCA/RV marginal stenosis managed medically   Cardiomyopathy (HCC)    Carotid artery disease (HCC)    CKD (chronic kidney disease) 06/28/2023   CKD (chronic kidney disease) stage 3, GFR 30-59 ml/min (HCC)    COPD (chronic obstructive pulmonary disease) (HCC)    Essential hypertension    Head and neck cancer (HCC) 2019   Right facial basal cell carcinoma s/p resection with right partial mastectomy and  partal rhinectomy with skin graft (06/13/17)   Iron  deficiency anemia    Mass of colon 08/06/2021   Formatting of this note might be different from the original. Last Assessment & Plan:  Formatting of this note might be different from the original.  Status post right hemicolectomy by Dr. Collene Dawson today 7/12.    -Will check postop CBC and BMP     Urinary retention     Past Surgical History:  Procedure Laterality Date   ANKLE CLOSED REDUCTION Right    open reduction   BASAL CELL CARCINOMA EXCISION  2019   at [facility]   BIOPSY  07/19/2021   Procedure: BIOPSY;  Surgeon:  Vinetta Greening, DO;  Location: [facility];  Service: Endoscopy;;   CATARACT EXTRACTION W/PHACO Left 01/23/2023   Procedure: CATARACT EXTRACTION PHACO AND INTRAOCULAR LENS PLACEMENT (IOC);  Surgeon: Tarri Farm, MD;  Location: [facility];  Service: Ophthalmology;  Laterality: Left;  CDE: 23.26   COLONOSCOPY N/A 05/31/2017   Procedure: COLONOSCOPY;  Surgeon: Suzette Espy, MD;  Location: [facility];  Service: Endoscopy;  Laterality: N/A;  2:45pm   COLONOSCOPY WITH PROPOFOL  N/A 07/19/2021   Procedure: COLONOSCOPY WITH PROPOFOL ;  Surgeon: Vinetta Greening, DO;  Location: [facility];  Service: Endoscopy;  Laterality: N/A;  3:00pm, moved up to 9:00   CYSTOSCOPY N/A 10/29/2018   Procedure: CYSTOSCOPY, CLOT EVACUATION;  Surgeon: Adelbert Homans, MD;  Location: [facility];  Service: Urology;  Laterality: N/A;   PARTIAL COLECTOMY Right 08/11/2021   Procedure: PARTIAL COLECTOMY, OPEN RIGHT HEMICOLECTOMY;  Surgeon: Awilda Bogus, MD;  Location: [facility];  Service: General;  Laterality: Right;   POLYPECTOMY  05/31/2017   Procedure: POLYPECTOMY;  Surgeon: Suzette Espy, MD;  Location: [facility];  Service: Endoscopy;;   RIGHT/LEFT HEART CATH AND CORONARY ANGIOGRAPHY N/A 10/22/2021   Procedure: RIGHT/LEFT HEART CATH AND CORONARY ANGIOGRAPHY;  Surgeon: Kyra Phy, MD;  Location: [facility];  Service: Cardiovascular;  Laterality: N/A;   TONSILLECTOMY     TRANSCAROTID ARTERY REVASCULARIZATION  Right 02/15/2021   Procedure: RIGHT TRANSCAROTID ARTERY REVASCULARIZATION;  Surgeon: Young Hensen, MD;  Location[facility] OR;  Service: Vascular;  Laterality: Right;   ULTRASOUND GUIDANCE FOR VASCULAR ACCESS Left 02/15/2021   Procedure: ULTRASOUND GUIDANCE FOR VASCULAR ACCESS;  Surgeon: Young Hensen, MD;  Location: [facility] OR;  Service: Vascular;  Laterality: Left;   XI ROBOTIC ASSISTED SIMPLE PROSTATECTOMY N/A 10/29/2018   Procedure: XI ROBOTIC ASSISTED SIMPLE PROSTATECTOMY;  Surgeon:  Marco Severs, MD;  Location: [facility];  Service: Urology;  Laterality: N/A;     Home Medications:  Prior to Admission medications   Medication Sig Start Date End Date Taking? Authorizing Provider  amoxicillin -clavulanate (AUGMENTIN ) 875-125 MG tablet Take 1 tablet by mouth every 12 (twelve) hours for 5 days. 06/25/23 06/30/23 Yes Leath-Warren, Belen Bowers, NP  apixaban  (ELIQUIS ) 2.5 MG TABS tablet Take 1 tablet (2.5 mg total) by mouth 2 (two) times daily. 04/21/23  Yes Shah, Pratik D, DO  atorvastatin  (LIPITOR ) 40 MG tablet Take 1 tablet (40 mg total) by mouth every evening. Patient taking differently: Take 40 mg by mouth daily. 08/01/22 02/05/24 Yes Shahmehdi, Constantino Demark, MD  Budeson-Glycopyrrol-Formoterol  (BREZTRI  AEROSPHERE) 160-9-4.8 MCG/ACT AERO Inhale 2 puffs into the lungs 2 (two) times daily.   Yes [provider]  carvedilol  (COREG ) 12.5 MG tablet Take 1 tablet (12.5 mg total) by mouth 2 (two) times daily with a meal. 05/17/23  Yes  Demaris Fillers, MD  dicyclomine  (BENTYL ) 20 MG tablet Take 1 tablet (20 mg total) by mouth 2 (two) times daily. 05/31/23  Yes Beatty, Celeste A, PA-C  diltiazem  (CARDIZEM ) 30 MG tablet Take 30 mg by mouth daily as needed (heart rate >110 BPM).   Yes [provider]  losartan  (COZAAR ) 25 MG tablet Take 1 tablet (25 mg total) by mouth daily. 05/17/23  Yes Tat, Myrtie Atkinson, MD  nitroGLYCERIN  (NITROSTAT ) 0.4 MG SL tablet Place 1 tablet (0.4 mg total) under the tongue every 5 (five) minutes as needed for chest pain. 02/05/23  Yes Merdis Stalling, MD  potassium chloride  SA (KLOR-CON  M) 20 MEQ tablet Take 1 tablet (20 mEq total) by mouth daily. 07/26/22 02/05/24 Yes Shahmehdi, Constantino Demark, MD  predniSONE  (DELTASONE ) 20 MG tablet Take 2 tablets (40 mg total) by mouth daily with breakfast for 5 days. 06/25/23 06/30/23 Yes Leath-Warren, Belen Bowers, NP  torsemide  (DEMADEX ) 20 MG tablet Take 20 mg by mouth 2 (two) times daily.   Yes [provider]  Vericiguat  (VERQUVO ) 5 MG  TABS Take 1 tablet (5 mg total) by mouth daily. 06/07/23  Yes Gerard Knight, MD  guaiFENesin  (ROBITUSSIN) 100 MG/5ML liquid Take 10 mLs by mouth every 6 (six) hours as needed for cough or to loosen phlegm. Patient not taking: Reported on 06/28/2023 06/25/23   Leath-Warren, Belen Bowers, NP    Inpatient Medications: Scheduled Meds:  apixaban   2.5 mg Oral BID   atorvastatin   40 mg Oral QPM   budesonide -glycopyrrolate -formoterol   2 puff Inhalation BID   carvedilol   12.5 mg Oral BID WC   furosemide   40 mg Intravenous Q6H   guaiFENesin -dextromethorphan   15 mL Oral Q8H   ipratropium-albuterol   3 mL Nebulization Q8H   methylPREDNISolone  (SOLU-MEDROL ) injection  60 mg Intravenous Q12H   Followed by   Cecily Cohen ON 06/30/2023] predniSONE   40 mg Oral Q breakfast   Vericiguat   5 mg Oral Daily   Continuous Infusions:  doxycycline (VIBRAMYCIN) IV     PRN Meds: acetaminophen  **OR** acetaminophen , ipratropium-albuterol , polyethylene glycol  Allergies:    Allergies  Allergen Reactions   Entresto  [Sacubitril -Valsartan ] Other (See Comments)    Orthostatic Hypotension Per pt, he has no allergies   Sglt2 Inhibitors Other (See Comments)    UTI's Per pt, he has no allergies    Social History:   Social History   Socioeconomic History   Marital status: Single    Spouse name: Not on file   Number of children: Not on file   Years of education: 12   Highest education level: 12th grade  Occupational History   Not on file  Tobacco Use   Smoking status: Former    Current packs/day: 0.00    Average packs/day: 0.3 packs/day for 40.0 years (10.0 ttl pk-yrs)    Types: Cigarettes    Start date: 07/02/1981    Quit date: 07/02/2021    Years since quitting: 1.9    Passive exposure: Past   Smokeless tobacco: Never   Tobacco comments:    Quit on 06/09/21  Vaping Use   Vaping status: Never Used  Substance and Sexual Activity   Alcohol use: Not Currently   Drug use: Never   Sexual activity: Not Currently   Other Topics Concern   Not on file  Social History Narrative   Not on file     Family History:   Family History  Problem Relation Age of Onset   Stroke Father    Cirrhosis Mother  Colon cancer Neg Hx      ROS:  Please see the history of present illness.  All other ROS reviewed and negative.     Physical Exam/Data:   Vitals:   06/28/23 2040 06/29/23 0056 06/29/23 0500 06/29/23 0521  BP: (!) 164/96 (!) 168/97  122/88  Pulse: 86 99  91  Resp: 20 20  20   Temp: 98.1 F (36.7 C) 98.6 F (37 C)  97.7 F (36.5 C)  TempSrc: Oral   Oral  SpO2: 95% 95%  96%  Weight:   69.6 kg   Height:        Intake/Output Summary (Last 24 hours) at 06/29/2023 0725 Last data filed at 06/29/2023 0517 Gross per 24 hour  Intake 480 ml  Output 200 ml  Net 280 ml      06/29/2023    5:00 AM 06/28/2023   12:09 PM 06/15/2023    4:08 PM  Last 3 Weights  Weight (lbs) 153 lb 7 oz 142 lb 13.7 oz 143 lb  Weight (kg) 69.6 kg 64.8 kg 64.864 kg     Body mass index is 20.81 kg/m.  General:  Laying in bed, in no acute distress HEENT: normal Neck: no JVD Vascular: No carotid bruits; Distal pulses 2+ bilaterally Cardiac:  normal S1, S2; irreg due to ectopic; no murmur  Lungs:  obvious wheezing throughout Abd: soft, nontender, no hepatomegaly  Ext: L > R +1 edema  Musculoskeletal:  No deformities, BUE and BLE strength normal and equal Skin: warm and dry  Neuro:  CNs 2-12 intact, no focal abnormalities noted Psych:  Normal affect   EKG:  The EKG was personally reviewed and demonstrates:  NSR, HR 98, prolonged PR interval 280 ms, chronic Q waves in anterior leads, chronic nonspecific T wave changes. Telemetry:  Telemetry was personally reviewed and demonstrates:  NSR 80-90's, prolonged PR, frequent PVCs and PACs  Relevant CV Studies: Echocardiogram 04/20/2023:  1. Left ventricular ejection fraction, by estimation, is 20 to 25%. The  left ventricle has severely decreased function. The left  ventricle  demonstrates regional wall motion abnormalities (see scoring  diagram/findings for description). The left  ventricular internal cavity size was mildly to moderately dilated. There  is mild concentric left ventricular hypertrophy. Left ventricular  diastolic parameters are consistent with Grade II diastolic dysfunction  (pseudonormalization).   2. Right ventricular systolic function is mildly reduced. The right  ventricular size is normal. Tricuspid regurgitation signal is inadequate  for assessing PA pressure.   3. Left atrial size was mildly dilated.   4. Right atrial size was mildly dilated.   5. The mitral valve is degenerative. Mild mitral valve regurgitation.   6. The aortic valve is tricuspid. Aortic valve regurgitation is not  visualized. No aortic stenosis is present. Aortic valve mean gradient  measures 3.5 mmHg.   7. The inferior vena cava is normal in size with greater than 50%  respiratory variability, suggesting right atrial pressure of 3 mmHg.   Carotid US  11/2022 IMPRESSION: 1. Interval stenting of the right carotid bulb and internal carotid arteries without a residual or recurrent hemodynamically significant narrowing. 2. Moderate-to-large amount of left-sided atherosclerotic plaque, morphologically progressed compared to the 2019 examination though not resulting in elevated peak systolic velocities to suggest a hemodynamically significant stenosis. If clinical concern persists, further evaluation with CTA could be performed as indicated.    Cardiac Cath 10/2021 Diagnostic Dominance: Right Left Anterior Descending  There is mild diffuse disease throughout the vessel.  Left Circumflex  There is mild diffuse disease throughout the vessel.    Right Coronary Artery  Mid RCA lesion is 70% stenosed.    Acute Marginal Branch  Acute Mrg lesion is 80% stenosed.    Third Right Posterolateral Branch  Collaterals  3rd RPL filled by collaterals from 2nd  Sept.    Collaterals  3rd RPL filled by collaterals from 2nd Sept.      Intervention   No interventions have been documented.   Diagnostic Dominance: Right   Laboratory Data:  High Sensitivity Troponin:   Recent Labs  Lab 05/31/23 1145  TROPONINIHS 67*     Chemistry Recent Labs  Lab 06/28/23 1317 06/29/23 0419  NA 138 142  K 4.0 3.2*  CL 103 106  CO2 22 23  GLUCOSE 188* 183*  BUN 43* 46*  CREATININE 2.41* 2.19*  CALCIUM  8.6* 8.2*  GFRNONAA 28* 32*  ANIONGAP 13 13    No results for input(s): "PROT", "ALBUMIN ", "AST", "ALT", "ALKPHOS", "BILITOT" in the last 168 hours. Lipids No results for input(s): "CHOL", "TRIG", "HDL", "LABVLDL", "LDLCALC", "CHOLHDL" in the last 168 hours.  Hematology Recent Labs  Lab 06/28/23 1317  WBC 7.2  RBC 4.77  HGB 13.1  HCT 43.2  MCV 90.6  MCH 27.5  MCHC 30.3  RDW 17.0*  PLT 202   Thyroid  No results for input(s): "TSH", "FREET4" in the last 168 hours.  BNP Recent Labs  Lab 06/28/23 1317  BNP >4,500.0*    DDimer No results for input(s): "DDIMER" in the last 168 hours.   Radiology/Studies:  DG Chest 2 View Result Date: 06/28/2023 CLINICAL DATA:  Shortness of breath, cough EXAM: CHEST - 2 VIEW COMPARISON:  06/15/2023 FINDINGS: Cardiomegaly, vascular congestion. Interstitial prominence could reflect interstitial edema. No effusions or acute bony abnormality. IMPRESSION: Cardiomegaly with vascular congestion and possible early interstitial edema. Electronically Signed   By: Janeece Mechanic M.D.   On: 06/28/2023 15:31     Assessment and Plan:   HFrEF/nonischemic cardiomyopathy - 03/2017: 50-55%, 07/2017: 55-60%, 12/2017: 45-50%, 10/2021: 30%, 04/2022: 35-40%, 07/2022: 25-30%, 08/2022: 25%, 11/2022: 25% - 04/2023: EF 20-25%, severely decreased LV function, positive RWMA, mild to moderately dilated LV cavity, mild concentric LVH, G2 DD, mildly reduced RV function mildly dilated L/R atria, mild MVR,   -Reported progressive worsening  of SOB, LE edema and cough x 1 week.  Noted SOB not relieved by rest and can occur at rest. Reported poor urine output approximately x 1/day, unchanged by Lasix .  No improvement in SOB, cough, or edema. BNP > 4500 (same sine 07/2022), CBC WNL.  Negative respiratory panel. - home meds: Coreg  12.5 mg twice daily, Cozaar  25 mg daily, Torsemide  20 mg twice daily, KCl 20 mEq daily, Verquivio  5 mg. Reported compliance to all med except torsemide , however suspect noncompliance.  -Treated with IV Lasix  60 Mg times X1 then continued on 40 Mg twice daily - I/O + 280, wt 142 > 153 (5/7: 144 lbs) , Cr 2.41 improved to 2.19 (baseline 1.6-1.8  about 1 month ago). Suspect weights are incorrect, ordering standing weights.   Pt appears somewhat volume overloaded with LE edema +1 L>R.  Obvious wheezing throughout lungs.  COPD may be contributing to lung findings  Holding losartan  due to renal insufficiency   Would continue diuresis   Follow I/O and renal function - Previously intolerant to Entresto  due to orthostatic hypotension. Not on SGLT2i with hx of UTI's.  - Continue Verquivio  5 mg. Ordered increased  Coreg  to 18.75 mg BID for better control of PVCs.  -Reduce soda consumption, and low-sodium diet.    Mild to moderate nonobstructive CAD - Cath 2023: mid RCA 70% and acute MRG 80%, which recommended DAPT x 1 year - home med: Coreg  12.5 mg twice daily, Losartan  25 mg daily, Lipitor  40 mg daily, NTG as needed - Denied any anginal symptoms.  No need for ischemic evaluation.  - Losartan   on hold as above - Continued Lipitor  as above. Coreg  increased as above.  - Not on ASA due to Eliquis .   RHYTHM Hx of PAF    Also with PVCs  - No A-fib present as suggested by EKG or telemetry. - Tele: NSR 80-90's, prolonged PR, frequent PVCs and PACs - Pt denied any palpitations -  Not on correct Eliquis  dosage at 2.5 mg . Ordered Eliquis  5 mg BID based on age, weight, and Cr. If weight goes < 60 kg can adjust dose.  Coreg   increased as above.  - considered monitor for PVC burden assessment   Unfortunately pt had one in past and did not bring back  Doubtful another would be approved.    HTN  - initial BP 150-180/90-110. BP this am 122/88 - Losartan  on hold as above - Coreg  increased as above.   Infrarenal AAA  - 06/06/2023: 4.1 cm, Followed by [facility]  Carotid artery disease - s/p right TCAR 2023  CKD stage IIIb  - initially CR 2.41 improved to 2.19 (baseline 1.6-1.8  about 1 month ago) - Losartan  on hold  COPD with acute exacerbation - Managed by primary  Risk Assessment/Risk Scores:  { [place]  Heart Association (NYHA) Functional Class NYHA Class IV  CHA2DS2-VASc Score = 4   This indicates a 4.8% annual risk of stroke. The patient's score is based upon: CHF History: 1 HTN History: 1 Diabetes History: 0 Stroke History: 0 Vascular Disease History: 1 Age Score: 1 Gender Score: 0    For questions or updates, please contact [facility] Please consult [email or web address] for contact info under    Signed, Metta Actis, PA-C  06/29/2023 7:25 AM   Patient seen and examined    I agree with findings as noted above by Alicia Inoue I have amended assessment to reflect my findnigs   Pt is a 68 yo with hx of NICM, PAF, CVDz, CKD and COPD (continues to smoke 3ppd) Admitted with increased SOB and edema   Admits to missing some meds   ON exam;  Neck   JVP is increased    Lungs   Diffuse wheezes   Moving air Cardiac RRR  no  S3     Abd   Nontender   No masses Ext   1+ LE edema   1  HFrEF   Volume overload on exam   Agree with diuresis    Follow renal function 2  COPD  Counselled on cessation  3   Rhythm   Continue telemetry   Pt in SR with frequent PVCs    Increase b blocker as BP tolerates to suppress    Excessive ectopy may be contributing to CHF  Will continue to follow   Rinard RossMD

## 2023-06-29 NOTE — TOC Initial Note (Signed)
 Transition of Care [facility]) - Initial/Assessment Note    Patient Details  Name: Michael [name] MRN: [id number] Date of Birth: Nov 10, 1955  Transition of Care [facility]) CM/SW Contact:    Orelia Binet, RN Phone Number: 06/29/2023, 2:16 PM  Clinical Narrative:          Patient admitted with CHF, multiple visits. CM at the bedside, patient states he takes his medication and does not need assistance. Ask permission to call [facility]. He agreed. Leward Record states they ask him everyday,did you take your medication, he says yes, but it is still on the table. He got new prescription and threw them in the trash. She states he just will not take them daily. He does not always eat, she cooks meals daily. He states he smokes three cigarettes a day, she said he smokes all day, heavy smokers.  They provide him room and meals and try to help but he will not listen. She stated he like the hospital and nurses waiting on him. I ask her to start the discussion with him about an ALF. They may need to tour and if he wants more care he can go to an ALF. TOC following, no new needs.         Expected Discharge Plan: Home/Self Care Barriers to Discharge: Continued Medical Work up   Patient Goals and CMS Choice Patient states their goals for this hospitalization and ongoing recovery are:: return home CMS Medicare.gov Compare Post Acute Care list provided to:: Patient Choice offered to / list presented to : Patient      Expected Discharge Plan and Services         Prior Living Arrangements/Services   Lives with:: Friends Patient language and need for interpreter reviewed:: Yes Do you feel safe going back to the place where you live?: Yes      Need for Family Participation in Patient Care: Yes (Comment) Care giver support system in place?: Yes (comment)   Criminal Activity/Legal Involvement Pertinent to Current Situation/Hospitalization: No - Comment as needed  Activities of Daily Living   ADL Screening  (condition at time of admission) Independently performs ADLs?: Yes (appropriate for developmental age) Is the patient deaf or have difficulty hearing?: No Does the patient have difficulty seeing, even when wearing glasses/contacts?: No Does the patient have difficulty concentrating, remembering, or making decisions?: No  Permission Sought/Granted    Emotional Assessment     Affect (typically observed): Accepting Orientation: : Oriented to Self, Oriented to Place, Oriented to  Time, Oriented to Situation Alcohol / Substance Use: Not Applicable Psych Involvement: No (comment)  Admission diagnosis:  CHF exacerbation (HCC) [I50.9] Patient Active Problem List   Diagnosis Date Noted   AAA (abdominal aortic aneurysm) without rupture (HCC) 06/06/2023   Hypertensive urgency 05/10/2023   Acute on chronic combined systolic (congestive) and diastolic (congestive) heart failure (HCC) 04/18/2023   Atrial fibrillation (HCC) 02/05/2023   Body mass index (BMI) 27.0-27.9, adult 12/01/2022   H/O prostatectomy 12/01/2022   Calculus of gallbladder without cholecystitis without obstruction 08/10/2022   Nicotine  dependence, cigarettes, uncomplicated 05/17/2022   Noncompliance with treatment plan 05/17/2022   Vitamin D  deficiency 05/17/2022   Elevated brain natriuretic peptide (BNP) level 04/29/2022   Solitary pulmonary nodule 04/26/2022   First degree heart block 04/12/2022   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   COPD (chronic obstructive pulmonary disease) (HCC) 11/03/2021   Aneurysm of infrarenal abdominal aorta (HCC) 11/03/2021   Functional gait abnormality 11/03/2021   Mixed hyperlipidemia 11/03/2021  Moderate protein-calorie malnutrition (HCC) 11/03/2021   Proteinuria 11/03/2021   Atrophy of pancreas 08/11/2021   CAD (coronary artery disease) 08/03/2021   CKD stage 3b, GFR 30-44 ml/min (HCC) 08/03/2021   Hardening of the aorta (main artery of the heart) (HCC) 08/03/2021   History  of adenomatous polyp of colon 06/16/2021   Carotid stenosis, right 02/15/2021   Occlusion and stenosis of right carotid artery 02/15/2021   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 12/06/2019   Incisional hernia 11/15/2019   Benign prostatic hyperplasia with urinary obstruction 10/29/2018   Chronic combined systolic and diastolic CHF (congestive heart failure) (HCC) 05/09/2018   COPD with acute exacerbation (HCC) 07/05/2017   Acute kidney injury superimposed on CKD (HCC) 07/05/2017   Disorder of carotid artery (HCC) 06/25/2017   Syncope due to orthostatic hypotension 06/24/2017   Cardiac murmur 06/24/2017   Prolonged QT interval 06/24/2017   Basal cell carcinoma of eyelid 05/15/2017   Basal cell carcinoma (BCC) of nostril 05/12/2017   Skin lesion of face 05/03/2017   Iron  deficiency anemia 04/04/2017   Essential (primary) hypertension 04/02/2017   Anemia of chronic disease 04/02/2017   Tobacco abuse 04/02/2017   Hypokalemia 04/02/2017   PCP:  [name], MD Pharmacy:   [facility] - [place], [place] - [address] [place] [address] Phone: [phone] Fax: [phone]     Social Drivers of Health (SDOH) Social History: SDOH Screenings   Food Insecurity: No Food Insecurity (06/28/2023)  Housing: Low Risk  (06/28/2023)  Recent Concern: Housing - High Risk (03/31/2023)  Transportation Needs: No Transportation Needs (06/28/2023)  Utilities: Not At Risk (06/28/2023)  Alcohol Screen: Low Risk  (12/26/2022)  Depression (PHQ2-9): Low Risk  (04/24/2023)  Financial Resource Strain: Low Risk  (12/26/2022)  Physical Activity: Sufficiently Active (12/26/2022)  Social Connections: Moderately Isolated (06/28/2023)  Stress: No Stress Concern Present (12/26/2022)  Tobacco Use: Medium Risk (06/28/2023)  Health Literacy: Adequate Health Literacy (12/26/2022)   SDOH Interventions:     Readmission Risk Interventions    05/10/2023    9:47 PM  04/19/2023    9:18 AM 04/02/2023    4:13 PM  Readmission Risk Prevention Plan  Transportation Screening Complete Complete Complete  Medication Review Oceanographer) Complete Complete Complete  PCP or Specialist appointment within 3-5 days of discharge Complete  Complete  HRI or Home Care Consult Complete Complete Complete  SW Recovery Care/Counseling Consult Complete Complete Complete  Palliative Care Screening Not Applicable Not Applicable Not Applicable  Skilled Nursing Facility Not Applicable Not Applicable Not Applicable

## 2023-06-30 DIAGNOSIS — I5043 Acute on chronic combined systolic (congestive) and diastolic (congestive) heart failure: Secondary | ICD-10-CM | POA: Diagnosis not present

## 2023-06-30 DIAGNOSIS — I10 Essential (primary) hypertension: Secondary | ICD-10-CM | POA: Diagnosis not present

## 2023-06-30 DIAGNOSIS — I5020 Unspecified systolic (congestive) heart failure: Secondary | ICD-10-CM | POA: Diagnosis not present

## 2023-06-30 LAB — RENAL FUNCTION PANEL
Albumin: 2.7 g/dL — ABNORMAL LOW (ref 3.5–5.0)
Anion gap: 10 (ref 5–15)
BUN: 48 mg/dL — ABNORMAL HIGH (ref 8–23)
CO2: 25 mmol/L (ref 22–32)
Calcium: 8 mg/dL — ABNORMAL LOW (ref 8.9–10.3)
Chloride: 104 mmol/L (ref 98–111)
Creatinine, Ser: 2.07 mg/dL — ABNORMAL HIGH (ref 0.61–1.24)
GFR, Estimated: 34 mL/min — ABNORMAL LOW (ref >60)
Glucose, Bld: 215 mg/dL — ABNORMAL HIGH (ref 70–99)
Phosphorus: 2.7 mg/dL (ref 2.5–4.6)
Potassium: 2.9 mmol/L — ABNORMAL LOW (ref 3.5–5.1)
Sodium: 139 mmol/L (ref 135–145)

## 2023-06-30 LAB — BASIC METABOLIC PANEL WITH GFR
Anion gap: 9 (ref 5–15)
BUN: 48 mg/dL — ABNORMAL HIGH (ref 8–23)
CO2: 26 mmol/L (ref 22–32)
Calcium: 8 mg/dL — ABNORMAL LOW (ref 8.9–10.3)
Chloride: 104 mmol/L (ref 98–111)
Creatinine, Ser: 2.06 mg/dL — ABNORMAL HIGH (ref 0.61–1.24)
GFR, Estimated: 34 mL/min — ABNORMAL LOW (ref >60)
Glucose, Bld: 214 mg/dL — ABNORMAL HIGH (ref 70–99)
Potassium: 2.9 mmol/L — ABNORMAL LOW (ref 3.5–5.1)
Sodium: 139 mmol/L (ref 135–145)

## 2023-06-30 MED ORDER — CARVEDILOL 12.5 MG PO TABS
25.0000 mg | ORAL_TABLET | Freq: Two times a day (BID) | ORAL | Status: DC
  Administered 2023-06-30 – 2023-07-02 (×5): 25 mg via ORAL
  Filled 2023-06-30 (×5): qty 2

## 2023-06-30 MED ORDER — FUROSEMIDE 10 MG/ML IJ SOLN
80.0000 mg | Freq: Two times a day (BID) | INTRAMUSCULAR | Status: DC
  Administered 2023-06-30 – 2023-07-01 (×3): 80 mg via INTRAVENOUS
  Filled 2023-06-30 (×3): qty 8

## 2023-06-30 NOTE — Progress Notes (Signed)
 Wrong Time input, this assessment was done at 2000 not 0800

## 2023-06-30 NOTE — Progress Notes (Signed)
 Actual time these vitals were done was 0000 06/30/23

## 2023-06-30 NOTE — Plan of Care (Signed)
  Problem: Education: Goal: Knowledge of General Education information will improve Description: Including pain rating scale, medication(s)/side effects and non-pharmacologic comfort measures Outcome: Progressing   Problem: Health Behavior/Discharge Planning: Goal: Ability to manage health-related needs will improve Outcome: Progressing   Problem: Clinical Measurements: Goal: Ability to maintain clinical measurements within normal limits will improve Outcome: Progressing Goal: Will remain free from infection Outcome: Progressing Goal: Diagnostic test results will improve Outcome: Progressing Goal: Respiratory complications will improve Outcome: Progressing Goal: Cardiovascular complication will be avoided Outcome: Progressing   Problem: Activity: Goal: Risk for activity intolerance will decrease Outcome: Progressing   Problem: Nutrition: Goal: Adequate nutrition will be maintained Outcome: Progressing   Problem: Coping: Goal: Level of anxiety will decrease Outcome: Progressing   Problem: Elimination: Goal: Will not experience complications related to bowel motility Outcome: Progressing Goal: Will not experience complications related to urinary retention Outcome: Progressing   Problem: Pain Managment: Goal: General experience of comfort will improve and/or be controlled Outcome: Progressing   Problem: Safety: Goal: Ability to remain free from injury will improve Outcome: Progressing   Problem: Skin Integrity: Goal: Risk for impaired skin integrity will decrease Outcome: Progressing   Problem: Education: Goal: Knowledge of disease or condition will improve Outcome: Progressing Goal: Knowledge of the prescribed therapeutic regimen will improve Outcome: Progressing Goal: Individualized Educational Video(s) Outcome: Progressing   Problem: Activity: Goal: Ability to tolerate increased activity will improve Outcome: Progressing Goal: Will verbalize the  importance of balancing activity with adequate rest periods Outcome: Progressing   Problem: Respiratory: Goal: Ability to maintain a clear airway will improve Outcome: Progressing Goal: Levels of oxygenation will improve Outcome: Progressing Goal: Ability to maintain adequate ventilation will improve Outcome: Progressing   Problem: Education: Goal: Ability to demonstrate management of disease process will improve Outcome: Progressing Goal: Ability to verbalize understanding of medication therapies will improve Outcome: Progressing Goal: Individualized Educational Video(s) Outcome: Progressing   Problem: Activity: Goal: Capacity to carry out activities will improve Outcome: Progressing   Problem: Cardiac: Goal: Ability to achieve and maintain adequate cardiopulmonary perfusion will improve Outcome: Progressing

## 2023-06-30 NOTE — Progress Notes (Signed)
 PROGRESS NOTE   Michael [name], is a 68 y.o. male, DOB - [birth year], WUJ:[id number]  Admit date - 06/28/2023   Admitting Physician Pati Bonine, MD  Outpatient Primary MD for the patient is [name], MD  LOS - 0  Chief Complaint  Patient presents with   Shortness of Breath      Brief Narrative:  68 y.o. male with medical history significant for combined systolic and diastolic CHF, atrial fibrillation, COPD, CKD 3, AAA admitted on 06/28/2023 with acute CHF exacerbation in the setting of noncompliance with diuretic therapy    -Assessment and Plan: 1) Acute on chronic combined systolic (congestive) and diastolic CHF -Clinically, radiologically and per lab values patient was in CHF exacerbation on admission  - Admits to noncompliance with medications and lifestyle -Recent echo 04/2023-EF 20 to 25%, grade 2 DD. -- Continue vericiguat  - Cardiology consult appreciated - Continue Coreg  -Losartan  on hold due to concerns of AKI 06/30/23 -Dyspnea improving -No weight documented - clinically remains volume overloaded -Cardiologist recommends continued IV Lasix  from 40 mg every 6 hours to 80 mg twice daily - Monitor daily weight and fluid input and output monitoring as well as respiratory  2)COPD with acute exacerbation (HCC) -Received IV Solu-Medrol --addition to oral prednisone , be judicious with steroids due to volume overload as above #1 -Dyspnea improving - Continue doxycycline , bronchodilators and mucolytics  3)PAFib--continue Coreg  for rate control -Continue Eliquis  for stroke prophylaxis  4)AKI----acute kidney injury on CKD stage - 3B -  creatinine on admission= 2.41   ,  baseline creatinine =1.6 to 2.0   ,  creatinine is now around 2.0 with diuresis,  -Continue to hold losartan  --renally adjust medications, avoid nephrotoxic agents / dehydration  / hypotension   5)Tobacco abuse - Smoking cessation advised, give nicotine  patch   6)Right Carotid stenosis -He  sees  Dr. Ethelle Herb fields -05/12/2017 carotid ultrasound shows 80-99% right carotid stenosis - right TCAR in 01/2021 and recent dopplers in 11/2022 showed no hemodynamically significant stenosis along the RICA but was noted to have a moderate to large amount of left-sided plaque which is similar to prior imaging  -Continue Eliquis  and atorvastatin    7)Basal cell carcinoma of nasolabial fold -06/06/2017--right partial rhinectomy, resection of left intranasal lesion, skin graft @[facility] -Margins were clear on pathology    8)Iron  deficiency anemia/Advanced colonic adenoma -The patient had colonoscopy 05/31/2017--Dr. Rourk -He was noted to have numerous polyps as well as a polypoid mass in the hepatic flexure to the cecum area -pathology of mass--tubular adenoma -He was referred to general surgery, Dr. Hillman Luck for hemicolectomy -He has been lost to follow-up -noncompliant with po iron    9)Mixed hyperlipidemia/CAD -Cath in 10/2021 showed mild to moderate nonobstructive CAD with medical management recommended. -continue coreg , atorvastatin   10) medication and Lifestyle noncompliance--- May benefit from home health services to improve compliance and reduce readmission rate    Status is: Inpatient   Disposition: The patient is from: Home              Anticipated d/c is to: Home              Anticipated d/c date is: 2 days              Patient currently is not medically stable to d/c. Barriers: Not Clinically Stable-   Code Status :  -  Code Status: Full Code   Family Communication:    NA (patient is alert, awake and coherent)   DVT Prophylaxis  :   -  SCDs    apixaban  (ELIQUIS ) tablet 5 mg   Lab Results  Component Value Date   PLT 202 06/28/2023    Inpatient Medications  Scheduled Meds:  apixaban   5 mg Oral BID   atorvastatin   40 mg Oral QPM   budesonide -glycopyrrolate -formoterol   2 puff Inhalation BID   carvedilol   25 mg Oral BID WC   furosemide   80 mg Intravenous BID    nicotine   21 mg Transdermal Daily   predniSONE   40 mg Oral Q breakfast   Vericiguat   5 mg Oral Daily   Continuous Infusions:  doxycycline (VIBRAMYCIN) IV 100 mg (06/30/23 1034)   PRN Meds:.acetaminophen  **OR** acetaminophen , ipratropium-albuterol , polyethylene glycol   Anti-infectives (From admission, onward)    Start     Dose/Rate Route Frequency Ordered Stop   06/29/23 0800  doxycycline (VIBRAMYCIN) 100 mg in sodium chloride  0.9 % 250 mL IVPB        100 mg 125 mL/hr over 120 Minutes Intravenous Every 12 hours 06/28/23 1909     06/28/23 1330  doxycycline (VIBRA-TABS) tablet 100 mg        100 mg Oral  Once 06/28/23 1317 06/28/23 1326         Subjective: Michael [name] today has no fevers, no emesis,  No chest pain,   - -Dyspnea improving - Continues to void well   Objective: Vitals:   06/29/23 2022 06/30/23 0005 06/30/23 0507 06/30/23 1215  BP:  (!) 143/85 (!) 157/86 (!) 145/84  Pulse:  70 62 67  Resp:   18   Temp:   97.6 F (36.4 C) 97.8 F (36.6 C)  TempSrc:   Oral Oral  SpO2: 95% 97% 95% 99%  Weight:      Height:        Intake/Output Summary (Last 24 hours) at 06/30/2023 1928 Last data filed at 06/30/2023 1530 Gross per 24 hour  Intake 1460 ml  Output --  Net 1460 ml   Filed Weights   06/28/23 1209 06/29/23 0500  Weight: 64.8 kg 69.6 kg    Physical Exam  Gen:- Awake Alert,  in no apparent distress  HEENT:-Facial deformities due to surgical resection of basal cell carcinoma of nasolabial fold Neck-Supple Neck, +ve JVD,.  Lungs-fair air movement, few scattered wheezes bilaterally CV- S1, S2 normal, regular  Abd-  +ve B.Sounds, Abd Soft, No tenderness,    Extremity/Skin:- +ve  edema, pedal pulses present  Psych-affect is appropriate, oriented x3 Neuro-generalized weakness, no new focal deficits, no tremors  Data Reviewed: I have personally reviewed following labs and imaging studies  CBC: Recent Labs  Lab 06/28/23 1317  WBC 7.2  NEUTROABS 5.8   HGB 13.1  HCT 43.2  MCV 90.6  PLT 202   Basic Metabolic Panel: Recent Labs  Lab 06/28/23 1317 06/29/23 0419 06/30/23 0416 06/30/23 0417  NA 138 142 139 139  K 4.0 3.2* 2.9* 2.9*  CL 103 106 104 104  CO2 22 23 26 25   GLUCOSE 188* 183* 214* 215*  BUN 43* 46* 48* 48*  CREATININE 2.41* 2.19* 2.06* 2.07*  CALCIUM  8.6* 8.2* 8.0* 8.0*  PHOS  --   --   --  2.7   GFR: Estimated Creatinine Clearance: 33.2 mL/min (A) (by C-G formula based on SCr of 2.07 mg/dL (H)).  Recent Results (from the past 240 hours)  Resp panel by RT-PCR (RSV, Flu A&B, Covid) Anterior Nasal Swab     Status: None   Collection Time: 06/28/23  1:17 PM  Specimen: Anterior Nasal Swab  Result Value Ref Range Status   SARS Coronavirus 2 by RT PCR NEGATIVE NEGATIVE Final    Comment: (NOTE) SARS-CoV-2 target nucleic acids are NOT DETECTED.  The SARS-CoV-2 RNA is generally detectable in upper respiratory specimens during the acute phase of infection. The lowest concentration of SARS-CoV-2 viral copies this assay can detect is 138 copies/mL. A negative result does not preclude SARS-Cov-2 infection and should not be used as the sole basis for treatment or other patient management decisions. A negative result may occur with  improper specimen collection/handling, submission of specimen other than nasopharyngeal swab, presence of viral mutation(s) within the areas targeted by this assay, and inadequate number of viral copies(<138 copies/mL). A negative result must be combined with clinical observations, patient history, and epidemiological information. The expected result is Negative.  Fact Sheet for Patients:  BloggerCourse.com  Fact Sheet for Healthcare Providers:  SeriousBroker.it  This test is no t yet approved or cleared by the [place]  FDA and  has been authorized for detection and/or diagnosis of SARS-CoV-2 by FDA under an Emergency Use Authorization  (EUA). This EUA will remain  in effect (meaning this test can be used) for the duration of the COVID-19 declaration under Section 564(b)(1) of the Act, 21 U.S.C.section 360bbb-3(b)(1), unless the authorization is terminated  or revoked sooner.       Influenza A by PCR NEGATIVE NEGATIVE Final   Influenza B by PCR NEGATIVE NEGATIVE Final    Comment: (NOTE) The Xpert Xpress SARS-CoV-2/FLU/RSV plus assay is intended as an aid in the diagnosis of influenza from Nasopharyngeal swab specimens and should not be used as a sole basis for treatment. Nasal washings and aspirates are unacceptable for Xpert Xpress SARS-CoV-2/FLU/RSV testing.  Fact Sheet for Patients: BloggerCourse.com  Fact Sheet for Healthcare Providers: SeriousBroker.it  This test is not yet approved or cleared by the [place]  FDA and has been authorized for detection and/or diagnosis of SARS-CoV-2 by FDA under an Emergency Use Authorization (EUA). This EUA will remain in effect (meaning this test can be used) for the duration of the COVID-19 declaration under Section 564(b)(1) of the Act, 21 U.S.C. section 360bbb-3(b)(1), unless the authorization is terminated or revoked.     Resp Syncytial Virus by PCR NEGATIVE NEGATIVE Final    Comment: (NOTE) Fact Sheet for Patients: BloggerCourse.com  Fact Sheet for Healthcare Providers: SeriousBroker.it  This test is not yet approved or cleared by the [place]  FDA and has been authorized for detection and/or diagnosis of SARS-CoV-2 by FDA under an Emergency Use Authorization (EUA). This EUA will remain in effect (meaning this test can be used) for the duration of the COVID-19 declaration under Section 564(b)(1) of the Act, 21 U.S.C. section 360bbb-3(b)(1), unless the authorization is terminated or revoked.  Performed at [facility], [address], [place], [place]  [address]       Radiology Studies: No results found.    Scheduled Meds:  apixaban   5 mg Oral BID   atorvastatin   40 mg Oral QPM   budesonide -glycopyrrolate -formoterol   2 puff Inhalation BID   carvedilol   25 mg Oral BID WC   furosemide   80 mg Intravenous BID   nicotine   21 mg Transdermal Daily   predniSONE   40 mg Oral Q breakfast   Vericiguat   5 mg Oral Daily   Continuous Infusions:  doxycycline  (VIBRAMYCIN ) IV 100 mg (06/30/23 1034)     LOS: 0 days    Colin Dawley M.D on 06/30/2023 at 7:28  PM  Go to [email or web address] - for contact info  [facility] - Office  [phone]  If 7PM-7AM, please contact night-coverage [email or web address] 06/30/2023, 7:28 PM

## 2023-06-30 NOTE — Progress Notes (Signed)
 Rounding Note   Patient Name: Michael [name] Date of Encounter: 06/30/2023  [facility] Cardiologist: Teddie Favre, MD   Subjective Pt is breathing some better   Denies CP   Scheduled Meds:  apixaban   5 mg Oral BID   atorvastatin   40 mg Oral QPM   budesonide -glycopyrrolate -formoterol   2 puff Inhalation BID   carvedilol   18.75 mg Oral BID WC   furosemide   40 mg Intravenous Q6H   nicotine   21 mg Transdermal Daily   predniSONE   40 mg Oral Q breakfast   Vericiguat   5 mg Oral Daily   Continuous Infusions:  doxycycline (VIBRAMYCIN) IV 100 mg (06/30/23 1034)   PRN Meds: acetaminophen  **OR** acetaminophen , ipratropium-albuterol , polyethylene glycol   Vital Signs  Vitals:   06/29/23 2022 06/30/23 0005 06/30/23 0507 06/30/23 1215  BP:  (!) 143/85 (!) 157/86 (!) 145/84  Pulse:  70 62 67  Resp:   18   Temp:   97.6 F (36.4 C) 97.8 F (36.6 C)  TempSrc:   Oral Oral  SpO2: 95% 97% 95% 99%  Weight:      Height:        Intake/Output Summary (Last 24 hours) at 06/30/2023 1404 Last data filed at 06/30/2023 1100 Gross per 24 hour  Intake 1220 ml  Output --  Net 1220 ml      06/29/2023    5:00 AM 06/28/2023   12:09 PM 06/15/2023    4:08 PM  Last 3 Weights  Weight (lbs) 153 lb 7 oz 142 lb 13.7 oz 143 lb  Weight (kg) 69.6 kg 64.8 kg 64.864 kg      Telemetry SR wth frequent PVCs   10 beat NSVT  - Personally Reviewed  ECG  No new  - Personally Reviewed  Physical Exam  GEN: No acute distress.  Sitting in chair  Neck: No JVD Cardiac: RRR, no murmur Respiratory:Moving air  No wheezes   + rhonchi   GI: Soft, nontender, non-distended  MS: Tr LE edema  Labs High Sensitivity Troponin:  No results for input(s): "TROPONINIHS" in the last 720 hours.   Chemistry Recent Labs  Lab 06/29/23 0419 06/30/23 0416 06/30/23 0417  NA 142 139 139  K 3.2* 2.9* 2.9*  CL 106 104 104  CO2 23 26 25   GLUCOSE 183* 214* 215*  BUN 46* 48* 48*  CREATININE 2.19* 2.06*  2.07*  CALCIUM  8.2* 8.0* 8.0*  ALBUMIN   --   --  2.7*  GFRNONAA 32* 34* 34*  ANIONGAP 13 9 10     Lipids No results for input(s): "CHOL", "TRIG", "HDL", "LABVLDL", "LDLCALC", "CHOLHDL" in the last 168 hours.  Hematology Recent Labs  Lab 06/28/23 1317  WBC 7.2  RBC 4.77  HGB 13.1  HCT 43.2  MCV 90.6  MCH 27.5  MCHC 30.3  RDW 17.0*  PLT 202   Thyroid  No results for input(s): "TSH", "FREET4" in the last 168 hours.  BNP Recent Labs  Lab 06/28/23 1317  BNP >4,500.0*    DDimer No results for input(s): "DDIMER" in the last 168 hours.   Radiology  DG Chest 2 View Result Date: 06/28/2023 CLINICAL DATA:  Shortness of breath, cough EXAM: CHEST - 2 VIEW COMPARISON:  06/15/2023 FINDINGS: Cardiomegaly, vascular congestion. Interstitial prominence could reflect interstitial edema. No effusions or acute bony abnormality. IMPRESSION: Cardiomegaly with vascular congestion and possible early interstitial edema. Electronically Signed   By: Janeece Mechanic M.D.   On: 06/28/2023 15:31    Cardiac Studies  Echo  march 2025   1. Left ventricular ejection fraction, by estimation, is 20 to 25%. The  left ventricle has severely decreased function. The left ventricle  demonstrates regional wall motion abnormalities (see scoring  diagram/findings for description). The left  ventricular internal cavity size was mildly to moderately dilated. There  is mild concentric left ventricular hypertrophy. Left ventricular  diastolic parameters are consistent with Grade II diastolic dysfunction  (pseudonormalization).   2. Right ventricular systolic function is mildly reduced. The right  ventricular size is normal. Tricuspid regurgitation signal is inadequate  for assessing PA pressure.   3. Left atrial size was mildly dilated.   4. Right atrial size was mildly dilated.   5. The mitral valve is degenerative. Mild mitral valve regurgitation.   6. The aortic valve is tricuspid. Aortic valve regurgitation is not   visualized. No aortic stenosis is present. Aortic valve mean gradient  measures 3.5 mmHg.   7. The inferior vena cava is normal in size with greater than 50%  respiratory variability, suggesting right atrial pressure of 3 mmHg.   Comparison(s): Prior images reviewed side by side. LVEF remains severely  reduced in 20-25% range.  Patient Profile   68 y.o. male   Assessment & Plan  1  HFrEF   Clinically volume status is improved   I/O though do not sugg this   Wt is not done yet today  Renal function stable    I would switch to 80 mg BID lasix      Follow strict I/O, wts and renal function  Keep on carvedilol , verquvo .  Hold losartan  as continue IV diuresis   2  CAD  No symptoms of angina  3  Rhythm    Pt continues to have a lot ovf PVCs   Continue carvedilol  at higher dose  May be contrib to problem 1     Without documenting overall burden would not empirically put on amiodarone   4  HTN  BP a little high today  Increase carvedilol    Would not resume losartan  for now as diurese   5  Renal  Cr is stable   6  HL   Keep on atorvstatin   For questions or updates, please contact [facility] Please consult [email or web address] for contact info under     Signed, Ola Berger, MD  06/30/2023, 2:04 PM

## 2023-06-30 NOTE — Plan of Care (Signed)
   Problem: Education: Goal: Knowledge of General Education information will improve Description: Including pain rating scale, medication(s)/side effects and non-pharmacologic comfort measures Outcome: Progressing   Problem: Clinical Measurements: Goal: Will remain free from infection Outcome: Progressing   Problem: Activity: Goal: Risk for activity intolerance will decrease Outcome: Progressing

## 2023-06-30 NOTE — Progress Notes (Signed)
 Mobility Specialist Progress Note:    06/30/23 0855  Mobility  Activity Ambulated with assistance in hallway  Level of Assistance Standby assist, set-up cues, supervision of patient - no hands on  Assistive Device None  Distance Ambulated (ft) 110 ft  Range of Motion/Exercises Active;All extremities  Activity Response Tolerated well  Mobility Referral Yes  Mobility visit 1 Mobility  Mobility Specialist Start Time (ACUTE ONLY) [id number]  Mobility Specialist Stop Time (ACUTE ONLY) 0915  Mobility Specialist Time Calculation (min) (ACUTE ONLY) 20 min   Pt received in bed, agreeable to mobility. Required SBA to stand and ambulate with no AD. Tolerated well,asx throughout. Left pt in chair, NT in room. All needs met.  Diontae Route Mobility Specialist Please contact via Special educational needs teacher or  Rehab office at [phone]

## 2023-07-01 DIAGNOSIS — I480 Paroxysmal atrial fibrillation: Secondary | ICD-10-CM | POA: Diagnosis present

## 2023-07-01 DIAGNOSIS — I130 Hypertensive heart and chronic kidney disease with heart failure and stage 1 through stage 4 chronic kidney disease, or unspecified chronic kidney disease: Secondary | ICD-10-CM | POA: Diagnosis present

## 2023-07-01 DIAGNOSIS — E876 Hypokalemia: Secondary | ICD-10-CM | POA: Diagnosis not present

## 2023-07-01 DIAGNOSIS — Z7952 Long term (current) use of systemic steroids: Secondary | ICD-10-CM | POA: Diagnosis not present

## 2023-07-01 DIAGNOSIS — E782 Mixed hyperlipidemia: Secondary | ICD-10-CM | POA: Diagnosis present

## 2023-07-01 DIAGNOSIS — N179 Acute kidney failure, unspecified: Secondary | ICD-10-CM | POA: Diagnosis present

## 2023-07-01 DIAGNOSIS — Z888 Allergy status to other drugs, medicaments and biological substances status: Secondary | ICD-10-CM | POA: Diagnosis not present

## 2023-07-01 DIAGNOSIS — Z7901 Long term (current) use of anticoagulants: Secondary | ICD-10-CM | POA: Diagnosis not present

## 2023-07-01 DIAGNOSIS — I5043 Acute on chronic combined systolic (congestive) and diastolic (congestive) heart failure: Secondary | ICD-10-CM | POA: Diagnosis present

## 2023-07-01 DIAGNOSIS — F17210 Nicotine dependence, cigarettes, uncomplicated: Secondary | ICD-10-CM | POA: Diagnosis present

## 2023-07-01 DIAGNOSIS — T502X6A Underdosing of carbonic-anhydrase inhibitors, benzothiadiazides and other diuretics, initial encounter: Secondary | ICD-10-CM | POA: Diagnosis present

## 2023-07-01 DIAGNOSIS — Z85828 Personal history of other malignant neoplasm of skin: Secondary | ICD-10-CM | POA: Diagnosis not present

## 2023-07-01 DIAGNOSIS — Z9049 Acquired absence of other specified parts of digestive tract: Secondary | ICD-10-CM | POA: Diagnosis not present

## 2023-07-01 DIAGNOSIS — Z961 Presence of intraocular lens: Secondary | ICD-10-CM | POA: Diagnosis present

## 2023-07-01 DIAGNOSIS — I493 Ventricular premature depolarization: Secondary | ICD-10-CM | POA: Diagnosis present

## 2023-07-01 DIAGNOSIS — I509 Heart failure, unspecified: Secondary | ICD-10-CM

## 2023-07-01 DIAGNOSIS — N1832 Chronic kidney disease, stage 3b: Secondary | ICD-10-CM | POA: Diagnosis present

## 2023-07-01 DIAGNOSIS — Z79899 Other long term (current) drug therapy: Secondary | ICD-10-CM | POA: Diagnosis not present

## 2023-07-01 DIAGNOSIS — I2510 Atherosclerotic heart disease of native coronary artery without angina pectoris: Secondary | ICD-10-CM | POA: Diagnosis present

## 2023-07-01 DIAGNOSIS — Z8589 Personal history of malignant neoplasm of other organs and systems: Secondary | ICD-10-CM | POA: Diagnosis not present

## 2023-07-01 DIAGNOSIS — D509 Iron deficiency anemia, unspecified: Secondary | ICD-10-CM | POA: Diagnosis present

## 2023-07-01 DIAGNOSIS — I428 Other cardiomyopathies: Secondary | ICD-10-CM | POA: Diagnosis present

## 2023-07-01 DIAGNOSIS — Z1152 Encounter for screening for COVID-19: Secondary | ICD-10-CM | POA: Diagnosis not present

## 2023-07-01 DIAGNOSIS — J441 Chronic obstructive pulmonary disease with (acute) exacerbation: Secondary | ICD-10-CM | POA: Diagnosis present

## 2023-07-01 LAB — BASIC METABOLIC PANEL WITH GFR
Anion gap: 11 (ref 5–15)
BUN: 49 mg/dL — ABNORMAL HIGH (ref 8–23)
CO2: 29 mmol/L (ref 22–32)
Calcium: 7.9 mg/dL — ABNORMAL LOW (ref 8.9–10.3)
Chloride: 97 mmol/L — ABNORMAL LOW (ref 98–111)
Creatinine, Ser: 1.99 mg/dL — ABNORMAL HIGH (ref 0.61–1.24)
GFR, Estimated: 36 mL/min — ABNORMAL LOW (ref >60)
Glucose, Bld: 387 mg/dL — ABNORMAL HIGH (ref 70–99)
Potassium: 2.2 mmol/L — CL (ref 3.5–5.1)
Sodium: 137 mmol/L (ref 135–145)

## 2023-07-01 MED ORDER — POTASSIUM CHLORIDE CRYS ER 20 MEQ PO TBCR
40.0000 meq | EXTENDED_RELEASE_TABLET | Freq: Once | ORAL | Status: AC
  Administered 2023-07-01: 40 meq via ORAL
  Filled 2023-07-01: qty 2

## 2023-07-01 MED ORDER — POTASSIUM CHLORIDE CRYS ER 20 MEQ PO TBCR
40.0000 meq | EXTENDED_RELEASE_TABLET | ORAL | Status: AC
  Administered 2023-07-01 (×2): 40 meq via ORAL
  Filled 2023-07-01 (×2): qty 2

## 2023-07-01 MED ORDER — POTASSIUM CHLORIDE 10 MEQ/100ML IV SOLN
10.0000 meq | INTRAVENOUS | Status: AC
  Administered 2023-07-01 (×2): 10 meq via INTRAVENOUS
  Filled 2023-07-01 (×2): qty 100

## 2023-07-01 MED ORDER — POTASSIUM CHLORIDE 10 MEQ/100ML IV SOLN: 10.0000 meq | INTRAVENOUS | Status: DC

## 2023-07-01 MED ORDER — FUROSEMIDE 10 MG/ML IJ SOLN
60.0000 mg | Freq: Two times a day (BID) | INTRAMUSCULAR | Status: DC
  Administered 2023-07-02: 60 mg via INTRAVENOUS
  Filled 2023-07-01: qty 6

## 2023-07-01 NOTE — Progress Notes (Addendum)
 PROGRESS NOTE   Michael [name], is a 68 y.o. male, DOB - April 07, 1955, ZOX:[id number]  Admit date - 06/28/2023   Admitting Physician Laterra Lubinski Quintella Buck, MD  Outpatient Primary MD for the patient is [name], MD  LOS - 0  Chief Complaint  Patient presents with   Shortness of Breath      Brief Narrative:  68 y.o. male with medical history significant for combined systolic and diastolic CHF, atrial fibrillation, COPD, CKD 3, AAA admitted on 06/28/2023 with acute CHF exacerbation in the setting of noncompliance with diuretic therapy    -Assessment and Plan: 1) Acute on chronic combined systolic (congestive) and diastolic CHF -Clinically, radiologically and per lab values patient was in CHF exacerbation on admission  - Admits to noncompliance with medications and lifestyle -Recent echo 04/2023-EF 20 to 25%, grade 2 DD. -- Continue vericiguat  - Cardiology consult appreciated - Continue Coreg  -Losartan  on hold due to concerns of AKI 07/01/23 -Dyspnea improving -Voiding well -- clinically remains volume overloaded -Decrease IV Lasix  to 60 mg twice daily from 80 mg starting on 07/02/2023 due to severe electrolyte derangement - Monitor daily weight and fluid input and output monitoring as well as respiratory  2)COPD with acute exacerbation (HCC) -Received IV Solu-Medrol --addition to oral prednisone , be judicious with steroids due to volume overload as above #1 -Dyspnea improving - Continue doxycycline , bronchodilators and mucolytics  3)PAFib--continue Coreg  for rate control -Continue Eliquis  for stroke prophylaxis  4)AKI----acute kidney injury on CKD stage - 3B -  creatinine on admission= 2.41   ,  baseline creatinine =1.6 to 2.0   ,  creatinine is now around 2.0 with diuresis,  -Continue to hold losartan  --renally adjust medications, avoid nephrotoxic agents / dehydration  / hypotension   5)Tobacco abuse - Smoking cessation advised, give nicotine  patch   6)Right Carotid  stenosis -He sees  Dr. Ethelle Herb fields -05/12/2017 carotid ultrasound shows 80-99% right carotid stenosis - right TCAR in 01/2021 and recent dopplers in 11/2022 showed no hemodynamically significant stenosis along the RICA but was noted to have a moderate to large amount of left-sided plaque which is similar to prior imaging  -Continue Eliquis  and atorvastatin    7)Basal cell carcinoma of nasolabial fold -06/06/2017--right partial rhinectomy, resection of left intranasal lesion, skin graft @[facility] -Margins were clear on pathology --Chewing difficulties due to oropharyngeal problems--so changed diet to soft diet    8)Iron  deficiency anemia/Advanced colonic adenoma -The patient had colonoscopy 05/31/2017--Dr. Rourk -He was noted to have numerous polyps as well as a polypoid mass in the hepatic flexure to the cecum area -pathology of mass--tubular adenoma -He was referred to general surgery, Dr. Hillman Luck for hemicolectomy -He has been lost to follow-up -noncompliant with po iron    9)Mixed hyperlipidemia/CAD -Cath in 10/2021 showed mild to moderate nonobstructive CAD with medical management recommended. -continue coreg , atorvastatin   10) medication and Lifestyle noncompliance--- May benefit from home health services to improve compliance and reduce readmission rate  11)Hypokalemia--- persistent hypokalemia, potassium is down to 2.2 due to aggressive IV diuresis with Lasix  -Replace potassium -Check serum mag    Status is: Inpatient   Disposition: The patient is from: Home              Anticipated d/c is to: Home              Anticipated d/c date is: 1 day              Patient currently is not medically stable to d/c. Barriers: Not  Clinically Stable-   Code Status :  -  Code Status: Full Code   Family Communication:    NA (patient is alert, awake and coherent)   DVT Prophylaxis  :   - SCDs    apixaban  (ELIQUIS ) tablet 5 mg   Lab Results  Component Value Date   PLT 202  06/28/2023    Inpatient Medications  Scheduled Meds:  apixaban   5 mg Oral BID   atorvastatin   40 mg Oral QPM   budesonide -glycopyrrolate -formoterol   2 puff Inhalation BID   carvedilol   25 mg Oral BID WC   furosemide   80 mg Intravenous BID   nicotine   21 mg Transdermal Daily   predniSONE   40 mg Oral Q breakfast   Vericiguat   5 mg Oral Daily   Continuous Infusions:  doxycycline  (VIBRAMYCIN ) IV Stopped (07/01/23 1012)   PRN Meds:.acetaminophen  **OR** acetaminophen , ipratropium-albuterol , polyethylene glycol   Anti-infectives (From admission, onward)    Start     Dose/Rate Route Frequency Ordered Stop   06/29/23 0800  doxycycline  (VIBRAMYCIN ) 100 mg in sodium chloride  0.9 % 250 mL IVPB        100 mg 125 mL/hr over 120 Minutes Intravenous Every 12 hours 06/28/23 1909     06/28/23 1330  doxycycline  (VIBRA -TABS) tablet 100 mg        100 mg Oral  Once 06/28/23 1317 06/28/23 1326       Subjective: [name] today has no fevers, no emesis,  No chest pain,   - -Dyspnea improving - Continues to void well - Patient states he is feeling overall better - Oral intake is fair   Objective: Vitals:   06/30/23 2039 07/01/23 0500 07/01/23 0523 07/01/23 1406  BP:   (!) 151/101 (!) 144/82  Pulse:   60 60  Resp:    19  Temp: 97.7 F (36.5 C)  97.6 F (36.4 C) 97.8 F (36.6 C)  TempSrc: Oral  Oral Oral  SpO2:   97% 97%  Weight:  64.4 kg    Height:        Intake/Output Summary (Last 24 hours) at 07/01/2023 1709 Last data filed at 07/01/2023 1618 Gross per 24 hour  Intake 1643.52 ml  Output --  Net 1643.52 ml   Filed Weights   06/28/23 1209 06/29/23 0500 07/01/23 0500  Weight: 64.8 kg 69.6 kg 64.4 kg    Physical Exam  Gen:- Awake Alert,  in no apparent distress  HEENT:-Facial deformities due to surgical resection of basal cell carcinoma of nasolabial fold Neck-Supple Neck, +ve JVD,.  Lungs-fair air movement, few scattered wheezes bilaterally CV- S1, S2 normal,  regular  Abd-  +ve B.Sounds, Abd Soft, No tenderness,    Extremity/Skin:-Improving lower extremity pitting edema, pedal pulses present  Psych-affect is appropriate, oriented x3 Neuro-generalized weakness, no new focal deficits, no tremors  Data Reviewed: I have personally reviewed following labs and imaging studies  CBC: Recent Labs  Lab 06/28/23 1317  WBC 7.2  NEUTROABS 5.8  HGB 13.1  HCT 43.2  MCV 90.6  PLT 202   Basic Metabolic Panel: Recent Labs  Lab 06/28/23 1317 06/29/23 0419 06/30/23 0416 06/30/23 0417 07/01/23 0237  NA 138 142 139 139 137  K 4.0 3.2* 2.9* 2.9* 2.2*  CL 103 106 104 104 97*  CO2 22 23 26 25 29   GLUCOSE 188* 183* 214* 215* 387*  BUN 43* 46* 48* 48* 49*  CREATININE 2.41* 2.19* 2.06* 2.07* 1.99*  CALCIUM  8.6* 8.2* 8.0* 8.0* 7.9*  PHOS  --   --   --  2.7  --    GFR: Estimated Creatinine Clearance: 31.9 mL/min (A) (by C-G formula based on SCr of 1.99 mg/dL (H)).  Recent Results (from the past 240 hours)  Resp panel by RT-PCR (RSV, Flu A&B, Covid) Anterior Nasal Swab     Status: None   Collection Time: 06/28/23  1:17 PM   Specimen: Anterior Nasal Swab  Result Value Ref Range Status   SARS Coronavirus 2 by RT PCR NEGATIVE NEGATIVE Final    Comment: (NOTE) SARS-CoV-2 target nucleic acids are NOT DETECTED.  The SARS-CoV-2 RNA is generally detectable in upper respiratory specimens during the acute phase of infection. The lowest concentration of SARS-CoV-2 viral copies this assay can detect is 138 copies/mL. A negative result does not preclude SARS-Cov-2 infection and should not be used as the sole basis for treatment or other patient management decisions. A negative result may occur with  improper specimen collection/handling, submission of specimen other than nasopharyngeal swab, presence of viral mutation(s) within the areas targeted by this assay, and inadequate number of viral copies(<138 copies/mL). A negative result must be combined  with clinical observations, patient history, and epidemiological information. The expected result is Negative.  Fact Sheet for Patients:  BloggerCourse.com  Fact Sheet for Healthcare Providers:  SeriousBroker.it  This test is no t yet approved or cleared by the [place]  FDA and  has been authorized for detection and/or diagnosis of SARS-CoV-2 by FDA under an Emergency Use Authorization (EUA). This EUA will remain  in effect (meaning this test can be used) for the duration of the COVID-19 declaration under Section 564(b)(1) of the Act, 21 U.S.C.section 360bbb-3(b)(1), unless the authorization is terminated  or revoked sooner.       Influenza A by PCR NEGATIVE NEGATIVE Final   Influenza B by PCR NEGATIVE NEGATIVE Final    Comment: (NOTE) The Xpert Xpress SARS-CoV-2/FLU/RSV plus assay is intended as an aid in the diagnosis of influenza from Nasopharyngeal swab specimens and should not be used as a sole basis for treatment. Nasal washings and aspirates are unacceptable for Xpert Xpress SARS-CoV-2/FLU/RSV testing.  Fact Sheet for Patients: BloggerCourse.com  Fact Sheet for Healthcare Providers: SeriousBroker.it  This test is not yet approved or cleared by the [place]  FDA and has been authorized for detection and/or diagnosis of SARS-CoV-2 by FDA under an Emergency Use Authorization (EUA). This EUA will remain in effect (meaning this test can be used) for the duration of the COVID-19 declaration under Section 564(b)(1) of the Act, 21 U.S.C. section 360bbb-3(b)(1), unless the authorization is terminated or revoked.     Resp Syncytial Virus by PCR NEGATIVE NEGATIVE Final    Comment: (NOTE) Fact Sheet for Patients: BloggerCourse.com  Fact Sheet for Healthcare Providers: SeriousBroker.it  This test is not yet approved  or cleared by the [place]  FDA and has been authorized for detection and/or diagnosis of SARS-CoV-2 by FDA under an Emergency Use Authorization (EUA). This EUA will remain in effect (meaning this test can be used) for the duration of the COVID-19 declaration under Section 564(b)(1) of the Act, 21 U.S.C. section 360bbb-3(b)(1), unless the authorization is terminated or revoked.  Performed at [facility], [address], [place], [place] [address]     Radiology Studies: No results found.  Scheduled Meds:  apixaban   5 mg Oral BID   atorvastatin   40 mg Oral QPM   budesonide -glycopyrrolate -formoterol   2 puff Inhalation BID   carvedilol   25 mg Oral BID WC   furosemide   80 mg Intravenous  BID   nicotine   21 mg Transdermal Daily   predniSONE   40 mg Oral Q breakfast   Vericiguat   5 mg Oral Daily   Continuous Infusions:  doxycycline  (VIBRAMYCIN ) IV Stopped (07/01/23 1012)     LOS: 0 days    Colin Dawley M.D on 07/01/2023 at 5:09 PM  Go to [email or web address] - for contact info  [facility] - Office  [phone]  If 7PM-7AM, please contact night-coverage [email or web address] 07/01/2023, 5:09 PM

## 2023-07-01 NOTE — Plan of Care (Signed)
  Problem: Education: Goal: Knowledge of General Education information will improve Description: Including pain rating scale, medication(s)/side effects and non-pharmacologic comfort measures Outcome: Progressing   Problem: Health Behavior/Discharge Planning: Goal: Ability to manage health-related needs will improve Outcome: Progressing   Problem: Clinical Measurements: Goal: Ability to maintain clinical measurements within normal limits will improve Outcome: Progressing Goal: Will remain free from infection Outcome: Progressing Goal: Diagnostic test results will improve Outcome: Progressing Goal: Respiratory complications will improve Outcome: Progressing Goal: Cardiovascular complication will be avoided Outcome: Progressing   Problem: Activity: Goal: Risk for activity intolerance will decrease Outcome: Progressing   Problem: Nutrition: Goal: Adequate nutrition will be maintained Outcome: Progressing   Problem: Coping: Goal: Level of anxiety will decrease Outcome: Progressing   Problem: Elimination: Goal: Will not experience complications related to bowel motility Outcome: Progressing Goal: Will not experience complications related to urinary retention Outcome: Progressing   Problem: Pain Managment: Goal: General experience of comfort will improve and/or be controlled Outcome: Progressing   Problem: Safety: Goal: Ability to remain free from injury will improve Outcome: Progressing   Problem: Skin Integrity: Goal: Risk for impaired skin integrity will decrease Outcome: Progressing   Problem: Education: Goal: Knowledge of disease or condition will improve Outcome: Progressing Goal: Knowledge of the prescribed therapeutic regimen will improve Outcome: Progressing Goal: Individualized Educational Video(s) Outcome: Progressing   Problem: Activity: Goal: Ability to tolerate increased activity will improve Outcome: Progressing Goal: Will verbalize the  importance of balancing activity with adequate rest periods Outcome: Progressing   Problem: Respiratory: Goal: Ability to maintain a clear airway will improve Outcome: Progressing Goal: Levels of oxygenation will improve Outcome: Progressing Goal: Ability to maintain adequate ventilation will improve Outcome: Progressing   Problem: Education: Goal: Ability to demonstrate management of disease process will improve Outcome: Progressing Goal: Ability to verbalize understanding of medication therapies will improve Outcome: Progressing Goal: Individualized Educational Video(s) Outcome: Progressing   Problem: Activity: Goal: Capacity to carry out activities will improve Outcome: Progressing   Problem: Cardiac: Goal: Ability to achieve and maintain adequate cardiopulmonary perfusion will improve Outcome: Progressing

## 2023-07-01 NOTE — Plan of Care (Signed)
  Problem: Clinical Measurements: Goal: Will remain free from infection Outcome: Progressing Goal: Respiratory complications will improve Outcome: Progressing   Problem: Clinical Measurements: Goal: Diagnostic test results will improve Outcome: Not Progressing   Problem: Activity: Goal: Risk for activity intolerance will decrease Outcome: Adequate for Discharge   Problem: Nutrition: Goal: Adequate nutrition will be maintained Outcome: Adequate for Discharge

## 2023-07-01 NOTE — Progress Notes (Addendum)
   07/01/23 0501  Provider Notification  Provider Name/Title Dr. Elyse Hand  Date Provider Notified 07/01/23  Time Provider Notified [phone]  Notification Reason Critical Result  Test performed and critical result potassium 2.2  Date Critical Result Received 07/01/23  Time Critical Result Received 0450  Provider response See new orders

## 2023-07-02 DIAGNOSIS — I5043 Acute on chronic combined systolic (congestive) and diastolic (congestive) heart failure: Secondary | ICD-10-CM | POA: Diagnosis not present

## 2023-07-02 LAB — BASIC METABOLIC PANEL WITH GFR
Anion gap: 12 (ref 5–15)
BUN: 50 mg/dL — ABNORMAL HIGH (ref 8–23)
CO2: 29 mmol/L (ref 22–32)
Calcium: 7.9 mg/dL — ABNORMAL LOW (ref 8.9–10.3)
Chloride: 96 mmol/L — ABNORMAL LOW (ref 98–111)
Creatinine, Ser: 1.68 mg/dL — ABNORMAL HIGH (ref 0.61–1.24)
GFR, Estimated: 44 mL/min — ABNORMAL LOW (ref >60)
Glucose, Bld: 369 mg/dL — ABNORMAL HIGH (ref 70–99)
Potassium: 3 mmol/L — ABNORMAL LOW (ref 3.5–5.1)
Sodium: 137 mmol/L (ref 135–145)

## 2023-07-02 LAB — MAGNESIUM: Magnesium: 1.4 mg/dL — ABNORMAL LOW (ref 1.7–2.4)

## 2023-07-02 MED ORDER — MAGNESIUM SULFATE 4 GM/100ML IV SOLN
4.0000 g | Freq: Once | INTRAVENOUS | Status: AC
  Administered 2023-07-02: 4 g via INTRAVENOUS
  Filled 2023-07-02: qty 100

## 2023-07-02 MED ORDER — LOSARTAN POTASSIUM 25 MG PO TABS: 25.0000 mg | ORAL_TABLET | Freq: Every day | ORAL | 5 refills | Status: DC

## 2023-07-02 MED ORDER — POTASSIUM CHLORIDE CRYS ER 20 MEQ PO TBCR: 20.0000 meq | EXTENDED_RELEASE_TABLET | Freq: Two times a day (BID) | ORAL | 2 refills | Status: DC

## 2023-07-02 MED ORDER — BREZTRI AEROSPHERE 160-9-4.8 MCG/ACT IN AERO: 2.0000 | INHALATION_SPRAY | Freq: Two times a day (BID) | RESPIRATORY_TRACT | 3 refills | Status: DC

## 2023-07-02 MED ORDER — NICOTINE 21 MG/24HR TD PT24: 21.0000 mg | MEDICATED_PATCH | Freq: Every day | TRANSDERMAL | 0 refills | Status: DC

## 2023-07-02 MED ORDER — POTASSIUM CHLORIDE CRYS ER 20 MEQ PO TBCR
40.0000 meq | EXTENDED_RELEASE_TABLET | ORAL | Status: AC
  Administered 2023-07-02: 40 meq via ORAL
  Filled 2023-07-02 (×2): qty 2

## 2023-07-02 MED ORDER — POTASSIUM CHLORIDE 10 MEQ/100ML IV SOLN: 10.0000 meq | INTRAVENOUS | Status: DC

## 2023-07-02 MED ORDER — APIXABAN 5 MG PO TABS: 5.0000 mg | ORAL_TABLET | Freq: Two times a day (BID) | ORAL | 5 refills | Status: DC

## 2023-07-02 MED ORDER — POTASSIUM CHLORIDE CRYS ER 20 MEQ PO TBCR
40.0000 meq | EXTENDED_RELEASE_TABLET | Freq: Once | ORAL | Status: DC
  Filled 2023-07-02: qty 2

## 2023-07-02 MED ORDER — POTASSIUM CHLORIDE CRYS ER 20 MEQ PO TBCR
40.0000 meq | EXTENDED_RELEASE_TABLET | Freq: Once | ORAL | Status: AC
  Administered 2023-07-02: 40 meq via ORAL
  Filled 2023-07-02: qty 2

## 2023-07-02 MED ORDER — CARVEDILOL 25 MG PO TABS: 25.0000 mg | ORAL_TABLET | Freq: Two times a day (BID) | ORAL | 3 refills | Status: DC

## 2023-07-02 MED ORDER — POTASSIUM CHLORIDE 10 MEQ/100ML IV SOLN
10.0000 meq | INTRAVENOUS | Status: DC
  Administered 2023-07-02 (×2): 10 meq via INTRAVENOUS
  Filled 2023-07-02 (×4): qty 100

## 2023-07-02 MED ORDER — TORSEMIDE 20 MG PO TABS: 20.0000 mg | ORAL_TABLET | ORAL | 5 refills | Status: DC

## 2023-07-02 MED ORDER — PREDNISONE 20 MG PO TABS: 20.0000 mg | ORAL_TABLET | Freq: Every day | ORAL | 0 refills | Status: AC

## 2023-07-02 MED ORDER — VERQUVO 5 MG PO TABS: 5.0000 mg | ORAL_TABLET | Freq: Every day | ORAL | 3 refills | Status: DC

## 2023-07-02 MED ORDER — DOXYCYCLINE HYCLATE 100 MG PO TABS: 100.0000 mg | ORAL_TABLET | Freq: Two times a day (BID) | ORAL | 0 refills | Status: AC

## 2023-07-02 NOTE — Plan of Care (Signed)
  Problem: Education: Goal: Knowledge of General Education information will improve Description: Including pain rating scale, medication(s)/side effects and non-pharmacologic comfort measures 07/02/2023 1657 by Garrison Kanner, RN Outcome: Adequate for Discharge 07/02/2023 0950 by Garrison Kanner, RN Outcome: Progressing   Problem: Health Behavior/Discharge Planning: Goal: Ability to manage health-related needs will improve Outcome: Adequate for Discharge   Problem: Clinical Measurements: Goal: Ability to maintain clinical measurements within normal limits will improve 07/02/2023 1657 by Garrison Kanner, RN Outcome: Adequate for Discharge 07/02/2023 0950 by Garrison Kanner, RN Outcome: Progressing Goal: Will remain free from infection Outcome: Adequate for Discharge Goal: Diagnostic test results will improve Outcome: Adequate for Discharge Goal: Respiratory complications will improve Outcome: Adequate for Discharge Goal: Cardiovascular complication will be avoided Outcome: Adequate for Discharge   Problem: Activity: Goal: Risk for activity intolerance will decrease 07/02/2023 1657 by Garrison Kanner, RN Outcome: Adequate for Discharge 07/02/2023 0950 by Garrison Kanner, RN Outcome: Progressing   Problem: Nutrition: Goal: Adequate nutrition will be maintained 07/02/2023 1657 by Garrison Kanner, RN Outcome: Adequate for Discharge 07/02/2023 0950 by Garrison Kanner, RN Outcome: Progressing   Problem: Coping: Goal: Level of anxiety will decrease Outcome: Adequate for Discharge   Problem: Elimination: Goal: Will not experience complications related to bowel motility Outcome: Adequate for Discharge Goal: Will not experience complications related to urinary retention Outcome: Adequate for Discharge   Problem: Pain Managment: Goal: General experience of comfort will improve and/or be controlled Outcome: Adequate for Discharge   Problem: Safety: Goal: Ability to remain free from injury  will improve Outcome: Adequate for Discharge   Problem: Skin Integrity: Goal: Risk for impaired skin integrity will decrease Outcome: Adequate for Discharge   Problem: Education: Goal: Knowledge of disease or condition will improve Outcome: Adequate for Discharge Goal: Knowledge of the prescribed therapeutic regimen will improve Outcome: Adequate for Discharge Goal: Individualized Educational Video(s) Outcome: Adequate for Discharge   Problem: Activity: Goal: Ability to tolerate increased activity will improve Outcome: Adequate for Discharge Goal: Will verbalize the importance of balancing activity with adequate rest periods Outcome: Adequate for Discharge   Problem: Respiratory: Goal: Ability to maintain a clear airway will improve Outcome: Adequate for Discharge Goal: Levels of oxygenation will improve Outcome: Adequate for Discharge Goal: Ability to maintain adequate ventilation will improve Outcome: Adequate for Discharge   Problem: Education: Goal: Ability to demonstrate management of disease process will improve Outcome: Adequate for Discharge Goal: Ability to verbalize understanding of medication therapies will improve Outcome: Adequate for Discharge Goal: Individualized Educational Video(s) Outcome: Adequate for Discharge   Problem: Activity: Goal: Capacity to carry out activities will improve Outcome: Adequate for Discharge   Problem: Cardiac: Goal: Ability to achieve and maintain adequate cardiopulmonary perfusion will improve Outcome: Adequate for Discharge

## 2023-07-02 NOTE — Plan of Care (Signed)

## 2023-07-02 NOTE — Progress Notes (Signed)
 Patient is refusing anymore oral or IV potassium.  States he would like to speak with the MD first.  He states he has yet to see the MD today.  Dr. Quintella Buck made aware of med refusal and patients wishes.  Med refusal noted on the [facility].

## 2023-07-02 NOTE — Discharge Summary (Signed)
 [name], is a 68 y.o. male  DOB 18-Oct-1955  MRN [id number].  Admission date:  06/28/2023  Admitting Physician  Colin Dawley, MD  Discharge Date:  07/02/2023   Primary MD  [name], MD  Recommendations for primary care physician for things to follow:  1)Very Low-salt diet advised---Less than 2 gm of Sodium per day advised----ok to use Mrs DASH salt substitute instead of Salt 2)Weigh yourself daily, call if you gain more than 3 pounds in 1 day or more than 5 pounds in 1 week as your diuretic medications may need to be adjusted 3)Limit your Fluid  intake to no more than 60 ounces (1.8 Liters) per day 4) it is very very important that you take your medications exactly as prescribed to avoid complications 5)Avoid ibuprofen/Advil/Aleve/Motrin/Goody Powders/Naproxen/BC powders/Meloxicam/Diclofenac/Indomethacin and other Nonsteroidal anti-inflammatory medications as these will make you more likely to bleed and can cause stomach ulcers, can also cause Kidney problems.  6)Watch for bleeding while on Blood Thinners--watch for blood in your stool which can make your stool black, maroon, mahogany or red---, blood in your urine which can make your urine pink or red, nosebleeds , also watch for possible bruising -You are taking Apixaban /Eliquis --- which is a blood thinner--- be careful to avoid injury or falls 7) repeat BMP and CBC blood test in about a week or so advised  Admission Diagnosis  CHF exacerbation (HCC) [I50.9] Acute exacerbation of CHF (congestive heart failure) (HCC) [I50.9]   Discharge Diagnosis  CHF exacerbation (HCC) [I50.9] Acute exacerbation of CHF (congestive heart failure) (HCC) [I50.9]    Principal Problem:   Acute on chronic combined systolic (congestive) and diastolic (congestive) heart failure (HCC) Active Problems:   COPD with acute exacerbation (HCC)   Acute kidney injury  superimposed on CKD (HCC)   Essential (primary) hypertension   CAD (coronary artery disease)   Atrial fibrillation (HCC)   Tobacco abuse   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   Acute exacerbation of CHF (congestive heart failure) (HCC)      Past Medical History:  Diagnosis Date   Aortic atherosclerosis (HCC) 08/03/2021   Atrial fibrillation (HCC)    CAD (coronary artery disease) 08/03/2021   Cardiac catheterization September 2023 with RCA/RV marginal stenosis managed medically   Cardiomyopathy (HCC)    Carotid artery disease (HCC)    CKD (chronic kidney disease) 06/28/2023   CKD (chronic kidney disease) stage 3, GFR 30-59 ml/min (HCC)    COPD (chronic obstructive pulmonary disease) (HCC)    Essential hypertension    Head and neck cancer (HCC) 2019   Right facial basal cell carcinoma s/p resection with right partial mastectomy and partal rhinectomy with skin graft (06/13/17)   Iron  deficiency anemia    Mass of colon 08/06/2021   Formatting of this note might be different from the original. Last Assessment & Plan:  Formatting of this note might be different from the original.  Status post right hemicolectomy by Dr. Collene Dawson today 7/12.    -Will check postop CBC and  BMP     Urinary retention     Past Surgical History:  Procedure Laterality Date   ANKLE CLOSED REDUCTION Right    open reduction   BASAL CELL CARCINOMA EXCISION  2019   at [facility]   BIOPSY  07/19/2021   Procedure: BIOPSY;  Surgeon: Vinetta Greening, DO;  [facility];  Service: Endoscopy;;   CATARACT EXTRACTION W/PHACO Left 01/23/2023   Procedure: CATARACT EXTRACTION PHACO AND INTRAOCULAR LENS PLACEMENT (IOC);  Surgeon: Tarri Farm, MD;  Location: [facility];  Service: Ophthalmology;  Laterality: Left;  CDE: 23.26   COLONOSCOPY N/A 05/31/2017   Procedure: COLONOSCOPY;  Surgeon: Suzette Espy, MD;  Location: [facility];  Service: Endoscopy;  Laterality: N/A;  2:45pm   COLONOSCOPY WITH PROPOFOL  N/A  07/19/2021   Procedure: COLONOSCOPY WITH PROPOFOL ;  Surgeon: Vinetta Greening, DO;  Location: [facility];  Service: Endoscopy;  Laterality: N/A;  3:00pm, moved up to 9:00   CYSTOSCOPY N/A 10/29/2018   Procedure: CYSTOSCOPY, CLOT EVACUATION;  Surgeon: Adelbert Homans, MD;  Location: [facility];  Service: Urology;  Laterality: N/A;   PARTIAL COLECTOMY Right 08/11/2021   Procedure: PARTIAL COLECTOMY, OPEN RIGHT HEMICOLECTOMY;  Surgeon: Awilda Bogus, MD;  Location: [facility];  Service: General;  Laterality: Right;   POLYPECTOMY  05/31/2017   Procedure: POLYPECTOMY;  Surgeon: Suzette Espy, MD;  [facility];  Service: Endoscopy;;   RIGHT/LEFT HEART CATH AND CORONARY ANGIOGRAPHY N/A 10/22/2021   Procedure: RIGHT/LEFT HEART CATH AND CORONARY ANGIOGRAPHY;  Surgeon: Kyra Phy, MD;  [facility]: [facility];  Service: Cardiovascular;  Laterality: N/A;   TONSILLECTOMY     TRANSCAROTID ARTERY REVASCULARIZATION  Right 02/15/2021   Procedure: RIGHT TRANSCAROTID ARTERY REVASCULARIZATION;  Surgeon: Young Hensen, MD;  Location[facility] OR;  Service: Vascular;  Laterality: Right;   ULTRASOUND GUIDANCE FOR VASCULAR ACCESS Left 02/15/2021   Procedure: ULTRASOUND GUIDANCE FOR VASCULAR ACCESS;  Surgeon: Young Hensen, MD;  Location: [facility] OR;  Service: Vascular;  Laterality: Left;   XI ROBOTIC ASSISTED SIMPLE PROSTATECTOMY N/A 10/29/2018   Procedure: XI ROBOTIC ASSISTED SIMPLE PROSTATECTOMY;  Surgeon: Marco Severs, MD;  Location: [facility];  Service: Urology;  Laterality: N/A;    HPI  from the history and physical done on the day of admission:  Chief Complaint: Difficulty breathing   HPI: Michael [name] is a 68 y.o. male with medical history significant for systolic and diastolic CHF, atrial fibrillation, COPD, CKD 3, AAA. Patient presented to the ED with complaints of difficulty breathing and cough of 1 week duration.  Denies add of the symptoms at baseline.  He also  reports swelling to bilateral lower extremities over the past week.  Denies abdominal bloating.  No chest pain. He supposed to be on torsemide  20 mg twice daily, but tells me on average he takes it 3 days a week because he forgets to take his medication.  He last took it this morning.  Reports compliance with Eliquis .   ED Course: Temperature 98.  Heart rate 90-100.  Respirate rate 17-26.  Blood pressure 158-184.  O2 sats 91 to 96% on room air. Wheezing appreciated in ED. BNP chronically elevated at greater than 4500. COVID influenza RSV negative. Chest x-ray shows vascular congestion and early interstitial edema. IV Solu-Medrol  125 mg given, IV Lasix  60 mg, doxycycline  and DuoNebs given. Hospitalist admit for CHF and component of COPD.   Review of Systems: As per HPI all other  systems reviewed and negative.    Hospital Course:   Brief Narrative:  68 y.o. male with medical history significant for combined systolic and diastolic CHF, atrial fibrillation, COPD, CKD 3, AAA admitted on 06/28/2023 with acute CHF exacerbation in the setting of noncompliance with diuretic therapy     -Assessment and Plan: 1) Acute on chronic combined systolic (congestive) and diastolic CHF--POA -Clinically, radiologically and per lab values patient was in CHF exacerbation on admission  - Admits to noncompliance with medications and lifestyle -Recent echo 04/2023-EF 20 to 25%, grade 2 DD. -- Continue vericiguat  - Cardiology consult appreciated - Continue Coreg  and Losartan   -Dyspnea improved after iv Lasix  therapy -Voiding well - Weight is down to 141 pounds from 153 pounds -Urine output poorly documented as patient has been voiding into the toilet bowl -No orthopnea or paroxysmal nocturnal dyspnea -Ambulating without significant dyspnea, post ambulation O2 sats 94 to 95% on room air --ok to discharge home on torsemide  40 mg every morning and 20 mg every afternoon -Take potassium   2)COPD with acute  exacerbation (HCC) -Received IV Solu-Medrol --transitioned to oral prednisone , be judicious with steroids due to volume overload concerns -Dyspnea improved - DC on Low dose doxycycline , bronchodilators and mucolytics   3)PAFib--continue Coreg  for rate control -Continue Eliquis  for stroke prophylaxis  4)AKI----acute kidney injury on CKD stage - 3B -  creatinine on admission= 2.41   ,  baseline creatinine =1.6 to 2.0   ,  creatinine is now around 2.0 with diuresis,  -Continue to hold losartan  -Repeat BMP within a week advised   5)Tobacco abuse - Smoking cessation advised, give nicotine  patch   6)Right Carotid stenosis -He sees  Dr. Ethelle Herb fields -05/12/2017 carotid ultrasound shows 80-99% right carotid stenosis - right TCAR in 01/2021 and recent dopplers in 11/2022 showed no hemodynamically significant stenosis along the RICA but was noted to have a moderate to large amount of left-sided plaque which is similar to prior imaging  -Continue Eliquis  and atorvastatin    7)Basal cell carcinoma of nasolabial fold -06/06/2017--right partial rhinectomy, resection of left intranasal lesion, skin graft @[facility] -Margins were clear on pathology --Chewing difficulties due to oropharyngeal problems-- soft diet advised    8)Iron  deficiency anemia/Advanced colonic adenoma -The patient had colonoscopy 05/31/2017--Dr. Rourk -He was noted to have numerous polyps as well as a polypoid mass in the hepatic flexure to the cecum area -pathology of mass--tubular adenoma -He was referred to general surgery, Dr. Hillman Luck for hemicolectomy -He has been lost to follow-up -noncompliant with po iron    9)Mixed hyperlipidemia/CAD -Cath in 10/2021 showed mild to moderate nonobstructive CAD with medical management recommended. -continue coreg , atorvastatin    10) medication and Lifestyle noncompliance--- May benefit from home health services to improve compliance and reduce readmission rate    11)Hypokalemia/hypomagnesemia ---  due to aggressive IV diuresis with Lasix  -Replaced - Repeat BMP within a week advised  Discharge Condition: stable  Follow UP--- with PCP as advised  Diet and Activity recommendation:  As advised  Discharge Instructions    Discharge Instructions     Call MD for:  difficulty breathing, headache or visual disturbances   Complete by: As directed    Call MD for:  persistant dizziness or light-headedness   Complete by: As directed    Call MD for:  persistant nausea and vomiting   Complete by: As directed    Call MD for:  temperature >100.4   Complete by: As directed    Diet - low sodium heart healthy  Complete by: As directed    Discharge instructions   Complete by: As directed    1)Very Low-salt diet advised---Less than 2 gm of Sodium per day advised----ok to use Mrs DASH salt substitute instead of Salt 2)Weigh yourself daily, call if you gain more than 3 pounds in 1 day or more than 5 pounds in 1 week as your diuretic medications may need to be adjusted 3)Limit your Fluid  intake to no more than 60 ounces (1.8 Liters) per day 4) it is very very important that you take your medications exactly as prescribed to avoid complications 5)Avoid ibuprofen/Advil/Aleve/Motrin/Goody Powders/Naproxen/BC powders/Meloxicam/Diclofenac/Indomethacin and other Nonsteroidal anti-inflammatory medications as these will make you more likely to bleed and can cause stomach ulcers, can also cause Kidney problems.  6)Watch for bleeding while on Blood Thinners--watch for blood in your stool which can make your stool black, maroon, mahogany or red---, blood in your urine which can make your urine pink or red, nosebleeds , also watch for possible bruising -You are taking Apixaban /Eliquis --- which is a blood thinner--- be careful to avoid injury or falls 7) repeat BMP and CBC blood test in about a week or so advised   Increase activity slowly   Complete by: As directed         Discharge Medications     Allergies as of 07/02/2023       Reactions   Entresto  [sacubitril -valsartan ] Other (See Comments)   Orthostatic Hypotension Per pt, he has no allergies   Sglt2 Inhibitors Other (See Comments)   UTI's Per pt, he has no allergies        Medication List     STOP taking these medications    amoxicillin -clavulanate 875-125 MG tablet Commonly known as: AUGMENTIN    diltiazem  30 MG tablet Commonly known as: CARDIZEM    guaiFENesin  100 MG/5ML liquid Commonly known as: ROBITUSSIN       TAKE these medications    apixaban  5 MG Tabs tablet Commonly known as: ELIQUIS  Take 1 tablet (5 mg total) by mouth 2 (two) times daily. What changed:  medication strength how much to take   atorvastatin  40 MG tablet Commonly known as: LIPITOR  Take 1 tablet (40 mg total) by mouth every evening. What changed: when to take this   Breztri  Aerosphere 160-9-4.8 MCG/ACT Aero inhaler Generic drug: budesonide -glycopyrrolate -formoterol  Inhale 2 puffs into the lungs 2 (two) times daily.   carvedilol  25 MG tablet Commonly known as: COREG  Take 1 tablet (25 mg total) by mouth 2 (two) times daily with a meal. What changed:  medication strength how much to take   dicyclomine  20 MG tablet Commonly known as: BENTYL  Take 1 tablet (20 mg total) by mouth 2 (two) times daily.   doxycycline  100 MG tablet Commonly known as: VIBRA -TABS Take 1 tablet (100 mg total) by mouth 2 (two) times daily for 5 days.   losartan  25 MG tablet Commonly known as: COZAAR  Take 1 tablet (25 mg total) by mouth daily.   nicotine  21 mg/24hr patch Commonly known as: NICODERM CQ  - dosed in mg/24 hours Place 1 patch (21 mg total) onto the skin daily.   nitroGLYCERIN  0.4 MG SL tablet Commonly known as: NITROSTAT  Place 1 tablet (0.4 mg total) under the tongue every 5 (five) minutes as needed for chest pain.   potassium chloride  SA 20 MEQ tablet Commonly known as: KLOR-CON  M Take 1 tablet  (20 mEq total) by mouth 2 (two) times daily. Take Potassium Tablet while Taking Demadex /Torsemide  What changed:  when to  take this additional instructions   predniSONE  20 MG tablet Commonly known as: DELTASONE  Take 1 tablet (20 mg total) by mouth daily with breakfast for 5 days. What changed: how much to take   torsemide  20 MG tablet Commonly known as: DEMADEX  Take 1 tablet (20 mg total) by mouth See admin instructions. Take 2 Tabs (40 mg) q am and 1 tab (20 mg) QPM-- What changed:  when to take this additional instructions   Verquvo  5 MG Tabs Generic drug: Vericiguat  Take 1 tablet (5 mg total) by mouth daily.        Major procedures and Radiology Reports - PLEASE review detailed and final reports for all details, in brief -   DG Chest 2 View Result Date: 06/28/2023 CLINICAL DATA:  Shortness of breath, cough EXAM: CHEST - 2 VIEW COMPARISON:  06/15/2023 FINDINGS: Cardiomegaly, vascular congestion. Interstitial prominence could reflect interstitial edema. No effusions or acute bony abnormality. IMPRESSION: Cardiomegaly with vascular congestion and possible early interstitial edema. Electronically Signed   By: Janeece Mechanic M.D.   On: 06/28/2023 15:31   DG Chest Port 1 View Result Date: 06/15/2023 CLINICAL DATA:  Sob EXAM: PORTABLE CHEST - 1 VIEW COMPARISON:  06/13/2023. FINDINGS: Cardiac silhouette enlarged. No evidence of pneumothorax or pleural effusion. No evidence of pulmonary edema. No osseous abnormalities identified. IMPRESSION: Enlarged cardiac silhouette.  Lungs are clear. Electronically Signed   By: Sydell Eva M.D.   On: 06/15/2023 21:09   US  Abdomen Limited RUQ (LIVER/GB) Result Date: 06/15/2023 CLINICAL DATA:  [id number] Abdominal pain, RUQ [id number] EXAM: ULTRASOUND ABDOMEN LIMITED RIGHT UPPER QUADRANT COMPARISON:  May 31, 2023 FINDINGS: Gallbladder: Redemonstrated single large gallstone filling the majority of the lumen and obscuring the majority of the downstream  gallbladder. Biliary sludge may also be present within the gallbladder lumen. The partially visualized gallbladder wall in the fundal region appears thickened measuring 7 mm. No sonographic Murphy's sign noted by sonographer. Common bile duct: Diameter: 12 mm Liver: Normal echogenicity. No focal lesion identified. No intrahepatic biliary ductal dilation. Portal vein is patent on color Doppler imaging with normal direction of blood flow towards the liver. Other: None. IMPRESSION: 1. Redemonstrated single large gallstone filling the majority of the gallbladder lumen and obscuring the majority of the downstream gallbladder. The partially visualized wall in the fundal region appears thickened measuring 7 mm. In the absence of a sonographic Murphy's sign and pericholecystic fluid, these changes may be related to acute hepatitis, upper abdominal inflammation, or volume overload, either from CHF or renal failure. Laboratory correlation recommended. 2. Redemonstrated abnormal dilation of the common bile duct measuring 12 mm. Correlation with serum bilirubin recommended. Electronically Signed   By: Rance Burrows M.D.   On: 06/15/2023 17:53   DG Chest 2 View Result Date: 06/13/2023 CLINICAL DATA:  Shortness of breath. EXAM: CHEST - 2 VIEW COMPARISON:  May 31, 2023. FINDINGS: Stable cardiomegaly. Mild central pulmonary vascular congestion. Stable interstitial densities which may represent scarring. No definite acute abnormality. Bony thorax is unremarkable. IMPRESSION: Stable chronic findings as noted above. Electronically Signed   By: Rosalene Colon M.D.   On: 06/13/2023 17:20    Micro Results   Recent Results (from the past 240 hours)  Resp panel by RT-PCR (RSV, Flu A&B, Covid) Anterior Nasal Swab     Status: None   Collection Time: 06/28/23  1:17 PM   Specimen: Anterior Nasal Swab  Result Value Ref Range Status   SARS Coronavirus 2 by RT PCR NEGATIVE NEGATIVE Final  Comment: (NOTE) SARS-CoV-2 target  nucleic acids are NOT DETECTED.  The SARS-CoV-2 RNA is generally detectable in upper respiratory specimens during the acute phase of infection. The lowest concentration of SARS-CoV-2 viral copies this assay can detect is 138 copies/mL. A negative result does not preclude SARS-Cov-2 infection and should not be used as the sole basis for treatment or other patient management decisions. A negative result may occur with  improper specimen collection/handling, submission of specimen other than nasopharyngeal swab, presence of viral mutation(s) within the areas targeted by this assay, and inadequate number of viral copies(<138 copies/mL). A negative result must be combined with clinical observations, patient history, and epidemiological information. The expected result is Negative.  Fact Sheet for Patients:  BloggerCourse.com  Fact Sheet for Healthcare Providers:  SeriousBroker.it  This test is no t yet approved or cleared by the [place]  FDA and  has been authorized for detection and/or diagnosis of SARS-CoV-2 by FDA under an Emergency Use Authorization (EUA). This EUA will remain  in effect (meaning this test can be used) for the duration of the COVID-19 declaration under Section 564(b)(1) of the Act, 21 U.S.C.section 360bbb-3(b)(1), unless the authorization is terminated  or revoked sooner.       Influenza A by PCR NEGATIVE NEGATIVE Final   Influenza B by PCR NEGATIVE NEGATIVE Final    Comment: (NOTE) The Xpert Xpress SARS-CoV-2/FLU/RSV plus assay is intended as an aid in the diagnosis of influenza from Nasopharyngeal swab specimens and should not be used as a sole basis for treatment. Nasal washings and aspirates are unacceptable for Xpert Xpress SARS-CoV-2/FLU/RSV testing.  Fact Sheet for Patients: BloggerCourse.com  Fact Sheet for Healthcare  Providers: SeriousBroker.it  This test is not yet approved or cleared by the [place]  FDA and has been authorized for detection and/or diagnosis of SARS-CoV-2 by FDA under an Emergency Use Authorization (EUA). This EUA will remain in effect (meaning this test can be used) for the duration of the COVID-19 declaration under Section 564(b)(1) of the Act, 21 U.S.C. section 360bbb-3(b)(1), unless the authorization is terminated or revoked.     Resp Syncytial Virus by PCR NEGATIVE NEGATIVE Final    Comment: (NOTE) Fact Sheet for Patients: BloggerCourse.com  Fact Sheet for Healthcare Providers: SeriousBroker.it  This test is not yet approved or cleared by the [place]  FDA and has been authorized for detection and/or diagnosis of SARS-CoV-2 by FDA under an Emergency Use Authorization (EUA). This EUA will remain in effect (meaning this test can be used) for the duration of the COVID-19 declaration under Section 564(b)(1) of the Act, 21 U.S.C. section 360bbb-3(b)(1), unless the authorization is terminated or revoked.  Performed at [facility], [address], [place], [place] [address]    Today   Subjective    Joash Tony today has no new complaints  No fever  Or chills   No Nausea, Vomiting or Diarrhea -- Ambulating without significant dyspnea, post ambulation O2 sats 94 to 95% on room air           Patient has been seen and examined prior to discharge   Objective   Blood pressure (!) 155/120, pulse 62, temperature (!) 97.4 F (36.3 C), temperature source Oral, resp. rate 12, height 6' (1.829 m), weight 64.3 kg, SpO2 95%.   Intake/Output Summary (Last 24 hours) at 07/02/2023 1330 Last data filed at 07/02/2023 0500 Gross per 24 hour  Intake 1618.04 ml  Output 600 ml  Net 1018.04 ml    Exam Gen:- Awake  Alert, no acute distress  HEENT:- Facial deformities due to surgical resection of  basal cell carcinoma of nasolabial fold  Neck-Supple Neck,No JVD,.  Lungs-fair air movement without wheezing or rales CV- S1, S2 normal, regular Abd-  +ve B.Sounds, Abd Soft, No tenderness,    Extremity/Skin:-Much improved edema,   good pulses Psych-affect is appropriate, oriented x3 Neuro-no new focal deficits, no tremors    Data Review   CBC w Diff:  Lab Results  Component Value Date   WBC 7.2 06/28/2023   HGB 13.1 06/28/2023   HGB 13.8 04/23/2011   HCT 43.2 06/28/2023   HCT 26.0 (L) 05/09/2018   PLT 202 06/28/2023   PLT 208 04/23/2011   LYMPHOPCT 9 06/28/2023   LYMPHOPCT 7.7 04/23/2011   MONOPCT 10 06/28/2023   MONOPCT 6.3 04/23/2011   EOSPCT 0 06/28/2023   EOSPCT 0.1 04/23/2011   BASOPCT 0 06/28/2023   BASOPCT 0.3 04/23/2011   CMP:  Lab Results  Component Value Date   NA 137 07/02/2023   NA 140 04/23/2011   K 3.0 (L) 07/02/2023   K 2.4 (LL) 04/23/2011   CL 96 (L) 07/02/2023   CL 100 04/23/2011   CO2 29 07/02/2023   CO2 28 04/23/2011   BUN 50 (H) 07/02/2023   BUN 17 04/23/2011   CREATININE 1.68 (H) 07/02/2023   CREATININE 1.18 04/23/2011   PROT 6.8 06/15/2023   PROT 7.3 04/23/2011   ALBUMIN  2.7 (L) 06/30/2023   ALBUMIN  3.2 (L) 04/23/2011   BILITOT 2.0 (H) 06/15/2023   BILITOT 1.0 04/23/2011   ALKPHOS 140 (H) 06/15/2023   ALKPHOS 62 04/23/2011   AST 23 06/15/2023   AST 64 (H) 04/23/2011   ALT 17 06/15/2023   ALT 70 04/23/2011  . Total Discharge time is about 33 minutes  Colin Dawley M.D on 07/02/2023 at 1:30 PM  Go to [email or web address] -  for contact info  [facility] - Office  [phone]

## 2023-07-02 NOTE — Discharge Instructions (Signed)
 1)Very Low-salt diet advised---Less than 2 gm of Sodium per day advised----ok to use Mrs DASH salt substitute instead of Salt 2)Weigh yourself daily, call if you gain more than 3 pounds in 1 day or more than 5 pounds in 1 week as your diuretic medications may need to be adjusted 3)Limit your Fluid  intake to no more than 60 ounces (1.8 Liters) per day 4) it is very very important that you take your medications exactly as prescribed to avoid complications 5)Avoid ibuprofen/Advil/Aleve/Motrin/Goody Powders/Naproxen/BC powders/Meloxicam/Diclofenac/Indomethacin and other Nonsteroidal anti-inflammatory medications as these will make you more likely to bleed and can cause stomach ulcers, can also cause Kidney problems.  6)Watch for bleeding while on Blood Thinners--watch for blood in your stool which can make your stool black, maroon, mahogany or red---, blood in your urine which can make your urine pink or red, nosebleeds , also watch for possible bruising -You are taking Apixaban /Eliquis --- which is a blood thinner--- be careful to avoid injury or falls 7) repeat BMP and CBC blood test in about a week or so advised

## 2023-07-03 ENCOUNTER — Telehealth: Payer: Self-pay

## 2023-07-03 NOTE — Transitions of Care (Post Inpatient/ED Visit) (Signed)
   07/03/2023  Name: Michael [name] MRN: [id number] DOB: [birth year]  Today's TOC FU Call Status: Today's TOC FU Call Status:: Unsuccessful Call (1st Attempt) Unsuccessful Call (1st Attempt) Date: 07/03/23  Attempted to reach the patient regarding the most recent Inpatient/ED visit.  Follow Up Plan: Additional outreach attempts will be made to reach the patient to complete the Transitions of Care (Post Inpatient/ED visit) call.   Tonia Frankel RN, CCM [place]  [facility]Population Health RN Care Manager [phone]

## 2023-07-04 ENCOUNTER — Emergency Department (HOSPITAL_COMMUNITY)
Admission: EM | Admit: 2023-07-04 | Discharge: 2023-07-06 | Disposition: A | Discharge status: Home / Self Care | Attending: Student | Admitting: Student

## 2023-07-04 ENCOUNTER — Emergency Department (HOSPITAL_COMMUNITY)

## 2023-07-04 ENCOUNTER — Telehealth: Payer: Self-pay | Admitting: *Deleted

## 2023-07-04 ENCOUNTER — Encounter (HOSPITAL_COMMUNITY): Payer: Self-pay

## 2023-07-04 ENCOUNTER — Other Ambulatory Visit: Payer: Self-pay

## 2023-07-04 DIAGNOSIS — Z7901 Long term (current) use of anticoagulants: Secondary | ICD-10-CM | POA: Insufficient documentation

## 2023-07-04 DIAGNOSIS — Z85828 Personal history of other malignant neoplasm of skin: Secondary | ICD-10-CM | POA: Diagnosis not present

## 2023-07-04 DIAGNOSIS — I5043 Acute on chronic combined systolic (congestive) and diastolic (congestive) heart failure: Secondary | ICD-10-CM | POA: Diagnosis not present

## 2023-07-04 DIAGNOSIS — R42 Dizziness and giddiness: Secondary | ICD-10-CM | POA: Insufficient documentation

## 2023-07-04 DIAGNOSIS — Z79899 Other long term (current) drug therapy: Secondary | ICD-10-CM | POA: Insufficient documentation

## 2023-07-04 DIAGNOSIS — W010XXA Fall on same level from slipping, tripping and stumbling without subsequent striking against object, initial encounter: Secondary | ICD-10-CM | POA: Diagnosis not present

## 2023-07-04 DIAGNOSIS — I509 Heart failure, unspecified: Secondary | ICD-10-CM

## 2023-07-04 DIAGNOSIS — Z955 Presence of coronary angioplasty implant and graft: Secondary | ICD-10-CM | POA: Insufficient documentation

## 2023-07-04 DIAGNOSIS — J449 Chronic obstructive pulmonary disease, unspecified: Secondary | ICD-10-CM | POA: Diagnosis not present

## 2023-07-04 DIAGNOSIS — R32 Unspecified urinary incontinence: Secondary | ICD-10-CM | POA: Insufficient documentation

## 2023-07-04 DIAGNOSIS — R600 Localized edema: Secondary | ICD-10-CM | POA: Diagnosis not present

## 2023-07-04 DIAGNOSIS — I110 Hypertensive heart disease with heart failure: Secondary | ICD-10-CM | POA: Diagnosis not present

## 2023-07-04 DIAGNOSIS — Z8589 Personal history of malignant neoplasm of other organs and systems: Secondary | ICD-10-CM | POA: Diagnosis not present

## 2023-07-04 DIAGNOSIS — N1832 Chronic kidney disease, stage 3b: Secondary | ICD-10-CM | POA: Diagnosis not present

## 2023-07-04 DIAGNOSIS — Z87891 Personal history of nicotine dependence: Secondary | ICD-10-CM | POA: Diagnosis not present

## 2023-07-04 DIAGNOSIS — Z7951 Long term (current) use of inhaled steroids: Secondary | ICD-10-CM | POA: Insufficient documentation

## 2023-07-04 DIAGNOSIS — R0600 Dyspnea, unspecified: Secondary | ICD-10-CM | POA: Diagnosis not present

## 2023-07-04 DIAGNOSIS — I2510 Atherosclerotic heart disease of native coronary artery without angina pectoris: Secondary | ICD-10-CM | POA: Insufficient documentation

## 2023-07-04 DIAGNOSIS — R0602 Shortness of breath: Secondary | ICD-10-CM | POA: Diagnosis not present

## 2023-07-04 DIAGNOSIS — I130 Hypertensive heart and chronic kidney disease with heart failure and stage 1 through stage 4 chronic kidney disease, or unspecified chronic kidney disease: Secondary | ICD-10-CM | POA: Insufficient documentation

## 2023-07-04 DIAGNOSIS — I517 Cardiomegaly: Secondary | ICD-10-CM | POA: Diagnosis not present

## 2023-07-04 LAB — BRAIN NATRIURETIC PEPTIDE: B Natriuretic Peptide: 3290 pg/mL — ABNORMAL HIGH (ref 0.0–100.0)

## 2023-07-04 LAB — CBC WITH DIFFERENTIAL/PLATELET
Abs Immature Granulocytes: 0.07 10*3/uL (ref 0.00–0.07)
Basophils Absolute: 0 10*3/uL (ref 0.0–0.1)
Basophils Relative: 0 %
Eosinophils Absolute: 0 10*3/uL (ref 0.0–0.5)
Eosinophils Relative: 0 %
HCT: 39.8 % (ref 39.0–52.0)
Hemoglobin: 12.2 g/dL — ABNORMAL LOW (ref 13.0–17.0)
Immature Granulocytes: 1 %
Lymphocytes Relative: 3 %
Lymphs Abs: 0.4 10*3/uL — ABNORMAL LOW (ref 0.7–4.0)
MCH: 27.2 pg (ref 26.0–34.0)
MCHC: 30.7 g/dL (ref 30.0–36.0)
MCV: 88.6 fL (ref 80.0–100.0)
Monocytes Absolute: 0.4 10*3/uL (ref 0.1–1.0)
Monocytes Relative: 3 %
Neutro Abs: 11.8 10*3/uL — ABNORMAL HIGH (ref 1.7–7.7)
Neutrophils Relative %: 93 %
Platelets: 178 10*3/uL (ref 150–400)
RBC: 4.49 MIL/uL (ref 4.22–5.81)
RDW: 16.2 % — ABNORMAL HIGH (ref 11.5–15.5)
WBC: 12.8 10*3/uL — ABNORMAL HIGH (ref 4.0–10.5)
nRBC: 0 % (ref 0.0–0.2)

## 2023-07-04 LAB — URINALYSIS, ROUTINE W REFLEX MICROSCOPIC
Bacteria, UA: NONE SEEN
Bilirubin Urine: NEGATIVE
Glucose, UA: >=500 mg/dL — AB
Hgb urine dipstick: NEGATIVE
Ketones, ur: NEGATIVE mg/dL
Leukocytes,Ua: NEGATIVE
Nitrite: NEGATIVE
Protein, ur: NEGATIVE mg/dL
Specific Gravity, Urine: 1.007 (ref 1.005–1.030)
pH: 6 (ref 5.0–8.0)

## 2023-07-04 LAB — COMPREHENSIVE METABOLIC PANEL WITH GFR
ALT: 18 U/L (ref 0–44)
AST: 22 U/L (ref 15–41)
Albumin: 2.8 g/dL — ABNORMAL LOW (ref 3.5–5.0)
Alkaline Phosphatase: 81 U/L (ref 38–126)
Anion gap: 8 (ref 5–15)
BUN: 52 mg/dL — ABNORMAL HIGH (ref 8–23)
CO2: 33 mmol/L — ABNORMAL HIGH (ref 22–32)
Calcium: 8.2 mg/dL — ABNORMAL LOW (ref 8.9–10.3)
Chloride: 95 mmol/L — ABNORMAL LOW (ref 98–111)
Creatinine, Ser: 1.9 mg/dL — ABNORMAL HIGH (ref 0.61–1.24)
GFR, Estimated: 38 mL/min — ABNORMAL LOW (ref >60)
Glucose, Bld: 345 mg/dL — ABNORMAL HIGH (ref 70–99)
Potassium: 4.3 mmol/L (ref 3.5–5.1)
Sodium: 136 mmol/L (ref 135–145)
Total Bilirubin: 1.1 mg/dL (ref 0.0–1.2)
Total Protein: 5.6 g/dL — ABNORMAL LOW (ref 6.5–8.1)

## 2023-07-04 LAB — TROPONIN I (HIGH SENSITIVITY)
Troponin I (High Sensitivity): 76 ng/L — ABNORMAL HIGH (ref <18)
Troponin I (High Sensitivity): 75 ng/L — ABNORMAL HIGH (ref <18)

## 2023-07-04 LAB — CBG MONITORING, ED: Glucose-Capillary: 304 mg/dL — ABNORMAL HIGH (ref 70–99)

## 2023-07-04 MED ORDER — INSULIN ASPART 100 UNIT/ML IJ SOLN
0.0000 [IU] | Freq: Every day | INTRAMUSCULAR | Status: DC
  Administered 2023-07-04: 4 [IU] via SUBCUTANEOUS
  Filled 2023-07-04: qty 1

## 2023-07-04 MED ORDER — LOSARTAN POTASSIUM 25 MG PO TABS
25.0000 mg | ORAL_TABLET | Freq: Every day | ORAL | Status: DC
  Administered 2023-07-05 – 2023-07-06 (×2): 25 mg via ORAL
  Filled 2023-07-04 (×2): qty 1

## 2023-07-04 MED ORDER — PREDNISONE 20 MG PO TABS
20.0000 mg | ORAL_TABLET | Freq: Every day | ORAL | Status: DC
  Administered 2023-07-05 – 2023-07-06 (×2): 20 mg via ORAL
  Filled 2023-07-04 (×2): qty 1

## 2023-07-04 MED ORDER — VERICIGUAT 5 MG PO TABS: 5.0000 mg | ORAL_TABLET | Freq: Every day | ORAL | Status: DC

## 2023-07-04 MED ORDER — APIXABAN 5 MG PO TABS
5.0000 mg | ORAL_TABLET | Freq: Two times a day (BID) | ORAL | Status: DC
  Administered 2023-07-04 – 2023-07-06 (×4): 5 mg via ORAL
  Filled 2023-07-04 (×4): qty 1

## 2023-07-04 MED ORDER — DOXYCYCLINE HYCLATE 100 MG PO TABS
100.0000 mg | ORAL_TABLET | Freq: Two times a day (BID) | ORAL | Status: DC
  Administered 2023-07-04 – 2023-07-06 (×4): 100 mg via ORAL
  Filled 2023-07-04 (×4): qty 1

## 2023-07-04 MED ORDER — DICYCLOMINE HCL 20 MG PO TABS
20.0000 mg | ORAL_TABLET | Freq: Two times a day (BID) | ORAL | Status: DC
  Administered 2023-07-04: 20 mg via ORAL
  Filled 2023-07-04 (×4): qty 1

## 2023-07-04 MED ORDER — CARVEDILOL 12.5 MG PO TABS
25.0000 mg | ORAL_TABLET | Freq: Two times a day (BID) | ORAL | Status: DC
  Administered 2023-07-05 – 2023-07-06 (×3): 25 mg via ORAL
  Filled 2023-07-04: qty 8
  Filled 2023-07-04 (×2): qty 2

## 2023-07-04 MED ORDER — TORSEMIDE 20 MG PO TABS: 20.0000 mg | ORAL_TABLET | ORAL | Status: DC

## 2023-07-04 MED ORDER — INSULIN ASPART 100 UNIT/ML IJ SOLN
0.0000 [IU] | Freq: Three times a day (TID) | INTRAMUSCULAR | Status: DC
  Administered 2023-07-05 (×2): 5 [IU] via SUBCUTANEOUS
  Administered 2023-07-06: 3 [IU] via SUBCUTANEOUS
  Administered 2023-07-06: 2 [IU] via SUBCUTANEOUS
  Filled 2023-07-04 (×4): qty 1

## 2023-07-04 MED ORDER — MECLIZINE HCL 12.5 MG PO TABS
25.0000 mg | ORAL_TABLET | Freq: Once | ORAL | Status: AC
  Administered 2023-07-04: 25 mg via ORAL
  Filled 2023-07-04: qty 2

## 2023-07-04 MED ORDER — TORSEMIDE 20 MG PO TABS
40.0000 mg | ORAL_TABLET | Freq: Every day | ORAL | Status: DC
  Administered 2023-07-05 – 2023-07-06 (×2): 40 mg via ORAL
  Filled 2023-07-04 (×2): qty 2

## 2023-07-04 MED ORDER — TORSEMIDE 20 MG PO TABS
20.0000 mg | ORAL_TABLET | Freq: Every evening | ORAL | Status: DC
  Administered 2023-07-04 – 2023-07-05 (×2): 20 mg via ORAL
  Filled 2023-07-04 (×2): qty 1

## 2023-07-04 MED ORDER — POTASSIUM CHLORIDE CRYS ER 20 MEQ PO TBCR
20.0000 meq | EXTENDED_RELEASE_TABLET | Freq: Two times a day (BID) | ORAL | Status: DC
  Administered 2023-07-04 – 2023-07-06 (×4): 20 meq via ORAL
  Filled 2023-07-04 (×4): qty 1

## 2023-07-04 MED ORDER — ATORVASTATIN CALCIUM 40 MG PO TABS
40.0000 mg | ORAL_TABLET | Freq: Every evening | ORAL | Status: DC
  Administered 2023-07-04 – 2023-07-05 (×2): 40 mg via ORAL
  Filled 2023-07-04 (×2): qty 1

## 2023-07-04 NOTE — ED Notes (Signed)
 Pt reports 1600 he started having lightheadedness, some ShOB, and urinary and fecal incontinence. He states before that he was not having symptoms.

## 2023-07-04 NOTE — ED Notes (Signed)
 Pt allowed food per EDP.

## 2023-07-04 NOTE — ED Notes (Signed)
 Pt A&Ox4. Ambulated to side of bed for urine sample. Pt denies pain at this time or dizziness. Pt did state he was hungry, this nurse did verbalize to pt that imaging would need to result before given food. Pt communicated understanding.

## 2023-07-04 NOTE — ED Notes (Signed)
 Pt asked to speak with EDP for an update. EDP notified.

## 2023-07-04 NOTE — ED Provider Notes (Signed)
 [place] EMERGENCY DEPARTMENT AT [facility] Provider Note  CSN: [id number] Arrival date & time: 07/04/23 1725  Chief Complaint(s) Dizziness  HPI Michael [name] is a 68 y.o. male with PMH CHF, A-fib, COPD, CKD, AAA, head neck cancer status post removal of significant nasal tissue recent hospital discharge on 07/02/2023 for COPD/CHF exacerbation who presents emergency department for evaluation of shortness of breath, lightheadedness and urinary and fecal incontinence.  Patient was dropped off by a friend named Jannetta Men who states that he will not return to pick up the patient that he is unable to care for the patient.  Here in the emergency room, patient endorses some dizziness and some mild shortness of breath but is not in respiratory distress and arrives with stable vital signs.  Denies chest pain, abdominal pain, vomiting, headache, fever or other systemic symptoms.   Past Medical History Past Medical History:  Diagnosis Date   Aortic atherosclerosis (HCC) 08/03/2021   Atrial fibrillation (HCC)    CAD (coronary artery disease) 08/03/2021   Cardiac catheterization September 2023 with RCA/RV marginal stenosis managed medically   Cardiomyopathy (HCC)    Carotid artery disease (HCC)    CKD (chronic kidney disease) 06/28/2023   CKD (chronic kidney disease) stage 3, GFR 30-59 ml/min (HCC)    COPD (chronic obstructive pulmonary disease) (HCC)    Essential hypertension    Head and neck cancer (HCC) 2019   Right facial basal cell carcinoma s/p resection with right partial mastectomy and partal rhinectomy with skin graft (06/13/17)   Iron  deficiency anemia    Mass of colon 08/06/2021   Formatting of this note might be different from the original. Last Assessment & Plan:  Formatting of this note might be different from the original.  Status post right hemicolectomy by Dr. Collene Dawson today 7/12.    -Will check postop CBC and BMP     Urinary retention    Patient Active Problem  List   Diagnosis Date Noted   Acute exacerbation of CHF (congestive heart failure) (HCC) 07/01/2023   AAA (abdominal aortic aneurysm) without rupture (HCC) 06/06/2023   Hypertensive urgency 05/10/2023   Acute on chronic combined systolic (congestive) and diastolic (congestive) heart failure (HCC) 04/18/2023   Atrial fibrillation (HCC) 02/05/2023   Body mass index (BMI) 27.0-27.9, adult 12/01/2022   H/O prostatectomy 12/01/2022   Calculus of gallbladder without cholecystitis without obstruction 08/10/2022   Nicotine  dependence, cigarettes, uncomplicated 05/17/2022   Noncompliance with treatment plan 05/17/2022   Vitamin D  deficiency 05/17/2022   Elevated brain natriuretic peptide (BNP) level 04/29/2022   Solitary pulmonary nodule 04/26/2022   First degree heart block 04/12/2022   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   COPD (chronic obstructive pulmonary disease) (HCC) 11/03/2021   Aneurysm of infrarenal abdominal aorta (HCC) 11/03/2021   Functional gait abnormality 11/03/2021   Mixed hyperlipidemia 11/03/2021   Moderate protein-calorie malnutrition (HCC) 11/03/2021   Proteinuria 11/03/2021   Atrophy of pancreas 08/11/2021   CAD (coronary artery disease) 08/03/2021   CKD stage 3b, GFR 30-44 ml/min (HCC) 08/03/2021   Hardening of the aorta (main artery of the heart) (HCC) 08/03/2021   History of adenomatous polyp of colon 06/16/2021   Carotid stenosis, right 02/15/2021   Occlusion and stenosis of right carotid artery 02/15/2021   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 12/06/2019   Incisional hernia 11/15/2019   Benign prostatic hyperplasia with urinary obstruction 10/29/2018   Chronic combined systolic and diastolic CHF (congestive heart failure) (HCC) 05/09/2018  COPD with acute exacerbation (HCC) 07/05/2017   Acute kidney injury superimposed on CKD (HCC) 07/05/2017   Disorder of carotid artery (HCC) 06/25/2017   Syncope due to orthostatic hypotension 06/24/2017    Cardiac murmur 06/24/2017   Prolonged QT interval 06/24/2017   Basal cell carcinoma of eyelid 05/15/2017   Basal cell carcinoma (BCC) of nostril 05/12/2017   Skin lesion of face 05/03/2017   Iron  deficiency anemia 04/04/2017   Essential (primary) hypertension 04/02/2017   Anemia of chronic disease 04/02/2017   Tobacco abuse 04/02/2017   Hypokalemia 04/02/2017   Home Medication(s) Prior to Admission medications   Medication Sig Start Date End Date Taking? Authorizing Provider  apixaban  (ELIQUIS ) 5 MG TABS tablet Take 1 tablet (5 mg total) by mouth 2 (two) times daily. 07/02/23   Colin Dawley, MD  atorvastatin  (LIPITOR ) 40 MG tablet Take 1 tablet (40 mg total) by mouth every evening. Patient taking differently: Take 40 mg by mouth daily. 08/01/22 02/05/24  Bobbetta Burnet, MD  budesonide -glycopyrrolate -formoterol  (BREZTRI  AEROSPHERE) 160-9-4.8 MCG/ACT AERO inhaler Inhale 2 puffs into the lungs 2 (two) times daily. 07/02/23   Colin Dawley, MD  carvedilol  (COREG ) 25 MG tablet Take 1 tablet (25 mg total) by mouth 2 (two) times daily with a meal. 07/02/23   Emokpae, Courage, MD  dicyclomine  (BENTYL ) 20 MG tablet Take 1 tablet (20 mg total) by mouth 2 (two) times daily. 05/31/23   Baxter Limber A, PA-C  doxycycline  (VIBRA -TABS) 100 MG tablet Take 1 tablet (100 mg total) by mouth 2 (two) times daily for 5 days. 07/02/23 07/07/23  Colin Dawley, MD  losartan  (COZAAR ) 25 MG tablet Take 1 tablet (25 mg total) by mouth daily. 07/02/23   Colin Dawley, MD  nicotine  (NICODERM CQ  - DOSED IN MG/24 HOURS) 21 mg/24hr patch Place 1 patch (21 mg total) onto the skin daily. 07/02/23   Colin Dawley, MD  nitroGLYCERIN  (NITROSTAT ) 0.4 MG SL tablet Place 1 tablet (0.4 mg total) under the tongue every 5 (five) minutes as needed for chest pain. 02/05/23   Merdis Stalling, MD  potassium chloride  SA (KLOR-CON  M) 20 MEQ tablet Take 1 tablet (20 mEq total) by mouth 2 (two) times daily. Take Potassium Tablet while  Taking Demadex /Torsemide  07/02/23 08/01/23  Colin Dawley, MD  predniSONE  (DELTASONE ) 20 MG tablet Take 1 tablet (20 mg total) by mouth daily with breakfast for 5 days. 07/02/23 07/07/23  Colin Dawley, MD  torsemide  (DEMADEX ) 20 MG tablet Take 1 tablet (20 mg total) by mouth See admin instructions. Take 2 Tabs (40 mg) q am and 1 tab (20 mg) QPM-- 07/02/23   Emokpae, Courage, MD  Vericiguat  (VERQUVO ) 5 MG TABS Take 1 tablet (5 mg total) by mouth daily. 07/02/23   Colin Dawley, MD  Past Surgical History Past Surgical History:  Procedure Laterality Date   ANKLE CLOSED REDUCTION Right    open reduction   BASAL CELL CARCINOMA EXCISION  2019   at [facility]   BIOPSY  07/19/2021   Procedure: BIOPSY;  Surgeon: Vinetta Greening, DO;  Location: [facility];  Service: Endoscopy;;   CATARACT EXTRACTION W/PHACO Left 01/23/2023   Procedure: CATARACT EXTRACTION PHACO AND INTRAOCULAR LENS PLACEMENT (IOC);  Surgeon: Tarri Farm, MD;  Location: [facility];  Service: Ophthalmology;  Laterality: Left;  CDE: 23.26   COLONOSCOPY N/A 05/31/2017   Procedure: COLONOSCOPY;  Surgeon: Suzette Espy, MD;  Location: [facility];  Service: Endoscopy;  Laterality: N/A;  2:45pm   COLONOSCOPY WITH PROPOFOL  N/A 07/19/2021   Procedure: COLONOSCOPY WITH PROPOFOL ;  Surgeon: Vinetta Greening, DO;  Location: [facility];  Service: Endoscopy;  Laterality: N/A;  3:00pm, moved up to 9:00   CYSTOSCOPY N/A 10/29/2018   Procedure: CYSTOSCOPY, CLOT EVACUATION;  Surgeon: Adelbert Homans, MD;  Location: [facility];  Service: Urology;  Laterality: N/A;   PARTIAL COLECTOMY Right 08/11/2021   Procedure: PARTIAL COLECTOMY, OPEN RIGHT HEMICOLECTOMY;  Surgeon: Awilda Bogus, MD;  Location: [facility];  Service: General;  Laterality: Right;   POLYPECTOMY  05/31/2017   Procedure: POLYPECTOMY;  Surgeon: Suzette Espy,  MD;  Location: [facility];  Service: Endoscopy;;   RIGHT/LEFT HEART CATH AND CORONARY ANGIOGRAPHY N/A 10/22/2021   Procedure: RIGHT/LEFT HEART CATH AND CORONARY ANGIOGRAPHY;  Surgeon: Kyra Phy, MD;  Location: [facility];  Service: Cardiovascular;  Laterality: N/A;   TONSILLECTOMY     TRANSCAROTID ARTERY REVASCULARIZATION  Right 02/15/2021   Procedure: RIGHT TRANSCAROTID ARTERY REVASCULARIZATION;  Surgeon: Young Hensen, MD;  Location: [facility] OR;  Service: Vascular;  Laterality: Right;   ULTRASOUND GUIDANCE FOR VASCULAR ACCESS Left 02/15/2021   Procedure: ULTRASOUND GUIDANCE FOR VASCULAR ACCESS;  Surgeon: Young Hensen, MD;  Location: [facility] OR;  Service: Vascular;  Laterality: Left;   XI ROBOTIC ASSISTED SIMPLE PROSTATECTOMY N/A 10/29/2018   Procedure: XI ROBOTIC ASSISTED SIMPLE PROSTATECTOMY;  Surgeon: Marco Severs, MD;  Location: [facility];  Service: Urology;  Laterality: N/A;   Family History Family History  Problem Relation Age of Onset   Stroke Father    Cirrhosis Mother    Colon cancer Neg Hx     Social History Social History   Tobacco Use   Smoking status: Former    Current packs/day: 0.00    Average packs/day: 0.3 packs/day for 40.0 years (10.0 ttl pk-yrs)    Types: Cigarettes    Start date: 07/02/1981    Quit date: 07/02/2021    Years since quitting: 2.0    Passive exposure: Past   Smokeless tobacco: Never   Tobacco comments:    Quit on 06/09/21  Vaping Use   Vaping status: Never Used  Substance Use Topics   Alcohol use: Not Currently   Drug use: Never   Allergies Entresto  [sacubitril -valsartan ] and Sglt2 inhibitors  Review of Systems Review of Systems  Constitutional:  Positive for fatigue.  Respiratory:  Positive for shortness of breath.   Cardiovascular:  Positive for leg swelling.  Neurological:  Positive for dizziness.    Physical Exam Vital Signs  I have reviewed the triage vital signs BP (!) 138/90 (BP Location: Right Arm)    Pulse 62   Temp (!) 97.5 F (36.4 C) (Oral)   Resp 15   Ht 6' (1.829 m)  Wt 64.3 kg   SpO2 94%   BMI 19.23 kg/m   Physical Exam Constitutional:      General: He is not in acute distress.    Appearance: Normal appearance.  HENT:     Head: Normocephalic and atraumatic.     Nose: No congestion or rhinorrhea.  Eyes:     General:        Right eye: No discharge.        Left eye: No discharge.     Extraocular Movements: Extraocular movements intact.     Pupils: Pupils are equal, round, and reactive to light.  Cardiovascular:     Rate and Rhythm: Normal rate and regular rhythm.     Heart sounds: No murmur heard. Pulmonary:     Effort: No respiratory distress.     Breath sounds: Rales present. No wheezing.  Abdominal:     General: There is no distension.     Tenderness: There is no abdominal tenderness.  Musculoskeletal:        General: Normal range of motion.     Cervical back: Normal range of motion.     Right lower leg: Edema present.     Left lower leg: Edema present.  Skin:    General: Skin is warm and dry.  Neurological:     General: No focal deficit present.     Mental Status: He is alert.     ED Results and Treatments Labs (all labs ordered are listed, but only abnormal results are displayed) Labs Reviewed  COMPREHENSIVE METABOLIC PANEL WITH GFR - Abnormal; Notable for the following components:      Result Value   Chloride 95 (*)    CO2 33 (*)    Glucose, Bld 345 (*)    BUN 52 (*)    Creatinine, Ser 1.90 (*)    Calcium  8.2 (*)    Total Protein 5.6 (*)    Albumin  2.8 (*)    GFR, Estimated 38 (*)    All other components within normal limits  CBC WITH DIFFERENTIAL/PLATELET - Abnormal; Notable for the following components:   WBC 12.8 (*)    Hemoglobin 12.2 (*)    RDW 16.2 (*)    Neutro Abs 11.8 (*)    Lymphs Abs 0.4 (*)    All other components within normal limits  BRAIN NATRIURETIC PEPTIDE - Abnormal; Notable for the following components:   B  Natriuretic Peptide 3,290.0 (*)    All other components within normal limits  URINALYSIS, ROUTINE W REFLEX MICROSCOPIC - Abnormal; Notable for the following components:   Glucose, UA >=500 (*)    All other components within normal limits  TROPONIN I (HIGH SENSITIVITY) - Abnormal; Notable for the following components:   Troponin I (High Sensitivity) 76 (*)    All other components within normal limits  TROPONIN I (HIGH SENSITIVITY) - Abnormal; Notable for the following components:   Troponin I (High Sensitivity) 75 (*)    All other components within normal limits  Radiology DG Chest Portable 1 View Result Date: 07/04/2023 EXAM: 1 VIEW XRAY OF THE CHEST 07/04/2023 07:17:00 PM COMPARISON: 06/28/2023 CLINICAL HISTORY: Dyspnea. Per triage; Pt arrived via POV with a friend named Jannetta Men, who reports to this RN that he will not be returning to pick the Pt up at time of discharge. Per Mr Maybell Spates, the Pt has been incontinent of both bowel and bladder lately and having multiple accidents around his home. Mr Maybell Spates reports Pt is unable to care for himself and he took him in 2 years ago when the Pt became homeless after being kicked out of the group home he was previously staying in. FINDINGS: LUNGS AND PLEURA: Increased interstitial markings, without frank interstitial edema. No focal pulmonary opacity. No pleural effusion. No pneumothorax. HEART AND MEDIASTINUM: Stable cardiomegaly. No acute abnormality of the cardiac and mediastinal silhouettes. BONES AND SOFT TISSUES: No acute osseous abnormality. IMPRESSION: 1. Stable cardiomegaly. No frank interstitial edema. Electronically signed by: Zadie Herter MD 07/04/2023 07:39 PM EDT RP Workstation: [id number]    Pertinent labs & imaging results that were available during my care of the patient were reviewed by me and considered in my medical  decision making (see MDM for details).  Medications Ordered in ED Medications  meclizine (ANTIVERT) tablet 25 mg (has no administration in time range)  apixaban  (ELIQUIS ) tablet 5 mg (has no administration in time range)  atorvastatin  (LIPITOR ) tablet 40 mg (has no administration in time range)  carvedilol  (COREG ) tablet 25 mg (has no administration in time range)  dicyclomine  (BENTYL ) tablet 20 mg (has no administration in time range)  doxycycline  (VIBRA -TABS) tablet 100 mg (has no administration in time range)  losartan  (COZAAR ) tablet 25 mg (has no administration in time range)  potassium chloride  SA (KLOR-CON  M) CR tablet 20 mEq (has no administration in time range)  predniSONE  (DELTASONE ) tablet 20 mg (has no administration in time range)  torsemide  (DEMADEX ) tablet 20 mg (has no administration in time range)  Vericiguat  TABS 5 mg (has no administration in time range)  insulin  aspart (novoLOG ) injection 0-15 Units (has no administration in time range)  insulin  aspart (novoLOG ) injection 0-5 Units (has no administration in time range)                                                                                                                                     Procedures Procedures  (including critical care time)  Medical Decision Making / ED Course   This patient presents to the ED for concern of shortness of breath, this involves an extensive number of treatment options, and is a complaint that carries with it a high risk of complications and morbidity.  The differential diagnosis includes Pe, PTX, Pulmonary Edema, ARDS, COPD/Asthma, ACS, CHF exacerbation, Arrhythmia, Pericardial Effusion/Tamponade, Anemia, Sepsis, Acidosis/Hypercapnia, Anxiety, Viral URI  MDM: Patient seen emergency room for evaluation of shortness of breath.  Physical exam with some faint  rales at the bilateral bases, bilateral lower extremity edema but is otherwise unremarkable.  Laboratory evaluation with  a leukocytosis to 12.8 likely secondary to recent steroid use, glucose 345, BUN 52, creatinine 1.9, albumin  2.8, high-sensitivity troponin is 75 and delta troponin is flat at 75, BNP 3920 which is improving from hospital discharge.  Chest x-ray unremarkable.  Urinalysis without evidence of infection.  On reevaluation, patient states he feels about the same and at this time he does not meet inpatient criteria for admission.  However, he does not have a safe discharge plan as he has nowhere to be discharged to now that his friend will not come and pick him up.  I placed a TOC consult to help assist with discharge planning.  His home medications were ordered as well as sliding scale insulin .  Please see provider signout note for continuation of workup.   Additional history obtained:  -External records from outside source obtained and reviewed including: Chart review including previous notes, labs, imaging, consultation notes   Lab Tests: -I ordered, reviewed, and interpreted labs.   The pertinent results include:   Labs Reviewed  COMPREHENSIVE METABOLIC PANEL WITH GFR - Abnormal; Notable for the following components:      Result Value   Chloride 95 (*)    CO2 33 (*)    Glucose, Bld 345 (*)    BUN 52 (*)    Creatinine, Ser 1.90 (*)    Calcium  8.2 (*)    Total Protein 5.6 (*)    Albumin  2.8 (*)    GFR, Estimated 38 (*)    All other components within normal limits  CBC WITH DIFFERENTIAL/PLATELET - Abnormal; Notable for the following components:   WBC 12.8 (*)    Hemoglobin 12.2 (*)    RDW 16.2 (*)    Neutro Abs 11.8 (*)    Lymphs Abs 0.4 (*)    All other components within normal limits  BRAIN NATRIURETIC PEPTIDE - Abnormal; Notable for the following components:   B Natriuretic Peptide 3,290.0 (*)    All other components within normal limits  URINALYSIS, ROUTINE W REFLEX MICROSCOPIC - Abnormal; Notable for the following components:   Glucose, UA >=500 (*)    All other components within  normal limits  TROPONIN I (HIGH SENSITIVITY) - Abnormal; Notable for the following components:   Troponin I (High Sensitivity) 76 (*)    All other components within normal limits  TROPONIN I (HIGH SENSITIVITY) - Abnormal; Notable for the following components:   Troponin I (High Sensitivity) 75 (*)    All other components within normal limits    Imaging Studies ordered: I ordered imaging studies including chest x-ray I independently visualized and interpreted imaging. I agree with the radiologist interpretation   Medicines ordered and prescription drug management: Meds ordered this encounter  Medications   meclizine (ANTIVERT) tablet 25 mg   apixaban  (ELIQUIS ) tablet 5 mg   atorvastatin  (LIPITOR ) tablet 40 mg   carvedilol  (COREG ) tablet 25 mg   dicyclomine  (BENTYL ) tablet 20 mg   doxycycline  (VIBRA -TABS) tablet 100 mg   losartan  (COZAAR ) tablet 25 mg   potassium chloride  SA (KLOR-CON  M) CR tablet 20 mEq   predniSONE  (DELTASONE ) tablet 20 mg   torsemide  (DEMADEX ) tablet 20 mg   Vericiguat  TABS 5 mg   insulin  aspart (novoLOG ) injection 0-15 Units    Correction coverage::   Moderate (average weight, post-op)    CBG < 70::   Implement Hypoglycemia Standing Orders and refer to Hypoglycemia Standing  Orders sidebar report    CBG 70 - 120::   0 units    CBG 121 - 150::   2 units    CBG 151 - 200::   3 units    CBG 201 - 250::   5 units    CBG 251 - 300::   8 units    CBG 301 - 350::   11 units    CBG 351 - 400::   15 units    CBG > 400:   call MD and obtain STAT lab verification   insulin  aspart (novoLOG ) injection 0-5 Units    Correction coverage::   HS scale    CBG < 70::   Implement Hypoglycemia Standing Orders and refer to Hypoglycemia Standing Orders sidebar report    CBG 70 - 120::   0 units    CBG 121 - 150::   0 units    CBG 151 - 200::   0 units    CBG 201 - 250::   2 units    CBG 251 - 300::   3 units    CBG 301 - 350::   4 units    CBG 351 - 400::   5 units    CBG >  400:   call MD and obtain STAT lab verification    -I have reviewed the patients home medicines and have made adjustments as needed  Critical interventions none  Consultations Obtained: I requested consultation with the [facility] team,  and discussed lab and imaging findings as well as pertinent plan - they recommend: Patient is a pending   Cardiac Monitoring: The patient was maintained on a cardiac monitor.  I personally viewed and interpreted the cardiac monitored which showed an underlying rhythm of: NSR  Social Determinants of Health:  Factors impacting patients care include: Currently does not have a safe discharge plan   Reevaluation: After the interventions noted above, I reevaluated the patient and found that they have :stayed the same  Co morbidities that complicate the patient evaluation  Past Medical History:  Diagnosis Date   Aortic atherosclerosis (HCC) 08/03/2021   Atrial fibrillation (HCC)    CAD (coronary artery disease) 08/03/2021   Cardiac catheterization September 2023 with RCA/RV marginal stenosis managed medically   Cardiomyopathy (HCC)    Carotid artery disease (HCC)    CKD (chronic kidney disease) 06/28/2023   CKD (chronic kidney disease) stage 3, GFR 30-59 ml/min (HCC)    COPD (chronic obstructive pulmonary disease) (HCC)    Essential hypertension    Head and neck cancer (HCC) 2019   Right facial basal cell carcinoma s/p resection with right partial mastectomy and partal rhinectomy with skin graft (06/13/17)   Iron  deficiency anemia    Mass of colon 08/06/2021   Formatting of this note might be different from the original. Last Assessment & Plan:  Formatting of this note might be different from the original.  Status post right hemicolectomy by Dr. Collene Dawson today 7/12.    -Will check postop CBC and BMP     Urinary retention       Dispostion: I considered admission for this patient, and disposition pending TOC evaluation.  Please see provider signout for  continuation of workup.     Final Clinical Impression(s) / ED Diagnoses Final diagnoses:  None     @PCDICTATION @    Karlyn Overman, MD 07/04/23 2246

## 2023-07-04 NOTE — Transitions of Care (Post Inpatient/ED Visit) (Addendum)
   07/04/2023  Name: Michael [name] MRN: [id number] DOB: 08/09/1955  Today's TOC FU Call Status: Today's TOC FU Call Status:: Successful TOC FU Call Completed TOC FU Call Complete Date: 07/04/23 Patient's Name and Date of Birth confirmed.  Transition Care Management Follow-up Telephone Call Date of Discharge: 07/02/23 Discharge [facility]: [facility] Type of Discharge: Inpatient Admission Primary Inpatient Discharge Diagnosis:: Acute on chronic combined systolic How have you been since you were released from the hospital?: Better Any questions or concerns?: No  Items Reviewed: Did you receive and understand the discharge instructions provided?: Yes Medications obtained,verified, and reconciled?: Yes (Medications Reviewed) Any new allergies since your discharge?: No Dietary orders reviewed?: Yes Type of Diet Ordered:: low sodium heart healthy  limit fluids to 1.8 liters per day Do you have support at home?: Yes People in Home [RPT]: alone Name of Support/Comfort Primary Source: Leward Record  Medications Reviewed Today: Medications Reviewed Today   Medications were not reviewed in this encounter     Home Care and Equipment/Supplies: Were Home Health Services Ordered?: NA Any new equipment or medical supplies ordered?: NA  Functional Questionnaire: Do you need assistance with bathing/showering or dressing?: No Do you need assistance with meal preparation?: Yes Do you need assistance with eating?: No Do you have difficulty maintaining continence: No Do you need assistance with getting out of bed/getting out of a chair/moving?: No Do you have difficulty managing or taking your medications?: Yes (Per Leward Record she is administering his medications now)  Follow up appointments reviewed: PCP Follow-up appointment confirmed?: No MD Provider Line Number:[phone] Given: Yes (Per regina friend she will call for appointment) Specialist Hospital Follow-up appointment confirmed?:  Yes Date of Specialist follow-up appointment?: 07/12/23 Follow-Up Specialty Provider:: Sheril Dines Do you need transportation to your follow-up appointment?: No Do you understand care options if your condition(s) worsen?: Yes-patient verbalized understanding  SDOH Interventions Today    Flowsheet Row Most Recent Value  SDOH Interventions   Food Insecurity Interventions Intervention Not Indicated  Housing Interventions Intervention Not Indicated  Transportation Interventions Intervention Not Indicated, Patient Resources (Friends/Family)  Utilities Interventions Intervention Not Indicated     RN discussed with caregiver [facility]) Hospitalist instructions Less than 2 gm of Sodium per day advised Weigh yourself daily, call if you gain more than 3 pounds in 1 day or more than 5 pounds in 1 week as your diuretic medications may need to be adjusted Limit your Fluid intake to no more than 60 ounces (1.8 Liters) per day Watch for bleeding while on Blood Thinners Avoid NSAIDS as these will make yo more likely to bleed  Una Ganser BSN RN [facility] Health Care Management Coordinator [email or web address] Direct Dial: [phone]  Fax: [phone] Website: [email or web address]

## 2023-07-04 NOTE — ED Notes (Signed)
 Patient has urinal, informed of need for urine sample. Patient stated " I don't have to pee yet."

## 2023-07-04 NOTE — ED Notes (Signed)
 Pt states the cough is unchanged from his usually COPD symptoms

## 2023-07-04 NOTE — ED Notes (Signed)
 Pt rang out for more crackers. A pack of graham crackers were provided.

## 2023-07-04 NOTE — ED Triage Notes (Signed)
 Pt arrived via POV with a friend named Jannetta Men, who reports to this RN that he will not be returning to pick the Pt up at time of discharge. Per Mr Maybell Spates, the Pt has been incontinent of both bowel and bladder lately and having multiple accidents around his home. Mr Maybell Spates reports Pt is unable to care for himself and he took him in 2 years ago when the Pt became homeless after being kicked out of the group home he was previously staying in.

## 2023-07-05 ENCOUNTER — Inpatient Hospital Stay: Payer: 59

## 2023-07-05 DIAGNOSIS — R0602 Shortness of breath: Secondary | ICD-10-CM | POA: Diagnosis not present

## 2023-07-05 LAB — CBG MONITORING, ED
Glucose-Capillary: 117 mg/dL — ABNORMAL HIGH (ref 70–99)
Glucose-Capillary: 211 mg/dL — ABNORMAL HIGH (ref 70–99)
Glucose-Capillary: 208 mg/dL — ABNORMAL HIGH (ref 70–99)
Glucose-Capillary: 164 mg/dL — ABNORMAL HIGH (ref 70–99)

## 2023-07-05 LAB — HEMOGLOBIN A1C
Hgb A1c MFr Bld: 6.7 % — ABNORMAL HIGH (ref 4.8–5.6)
Mean Plasma Glucose: 145.59 mg/dL

## 2023-07-05 MED ORDER — DICYCLOMINE HCL 10 MG PO CAPS
20.0000 mg | ORAL_CAPSULE | Freq: Two times a day (BID) | ORAL | Status: DC
  Administered 2023-07-05 – 2023-07-06 (×3): 20 mg via ORAL
  Filled 2023-07-05 (×3): qty 2

## 2023-07-05 MED ORDER — NICOTINE 21 MG/24HR TD PT24
21.0000 mg | MEDICATED_PATCH | Freq: Every day | TRANSDERMAL | Status: DC
  Administered 2023-07-05 – 2023-07-06 (×2): 21 mg via TRANSDERMAL
  Filled 2023-07-05 (×2): qty 1

## 2023-07-05 MED ORDER — ACETAMINOPHEN 325 MG PO TABS
650.0000 mg | ORAL_TABLET | ORAL | Status: DC | PRN
  Administered 2023-07-05: 650 mg via ORAL
  Filled 2023-07-05: qty 2

## 2023-07-05 MED ORDER — LOPERAMIDE HCL 2 MG PO CAPS: 2.0000 mg | ORAL_CAPSULE | ORAL | Status: DC | PRN

## 2023-07-05 MED ORDER — LOPERAMIDE HCL 2 MG PO CAPS
4.0000 mg | ORAL_CAPSULE | Freq: Once | ORAL | Status: AC
  Administered 2023-07-05: 4 mg via ORAL
  Filled 2023-07-05: qty 2

## 2023-07-05 NOTE — ED Notes (Signed)
 Pt had appeared to unknowingly defecate on self while in his room, still wearing his pants. Old clothing changed out, pt cleaned up and provided with new pair of pants. Now sitting up eating lunch.

## 2023-07-05 NOTE — Evaluation (Signed)
 Physical Therapy Evaluation Patient Details Name: Michael [name] MRN: [id number] DOB: 1955-08-03 Today's Date: 07/05/2023  History of Present Illness  Michael [name] is a 68 y.o. male with PMH CHF, A-fib, COPD, CKD, AAA, head neck cancer status post removal of significant nasal tissue recent hospital discharge on 07/02/2023 for COPD/CHF exacerbation who presents emergency department for evaluation of shortness of breath, lightheadedness and urinary and fecal incontinence.  Patient was dropped off by a friend named Jannetta Men who states that he will not return to pick up the patient that he is unable to care for the patient.  Here in the emergency room, patient endorses some dizziness and some mild shortness of breath but is not in respiratory distress and arrives with stable vital signs.  Denies chest pain, abdominal pain, vomiting, headache, fever or other systemic symptoms.   Clinical Impression  Patient demonstrates labored movement for sitting up at bedside, unsteady on feet during ambulation having to frequently lean on near by objects, walls for support and limited mostly due to c/o fatigue, generalized weakness and headache. Patient will benefit from continued skilled physical therapy in hospital and recommended venue below to increase strength, balance, endurance for safe ADLs and gait.         If plan is discharge home, recommend the following: A lot of help with walking and/or transfers;A little help with bathing/dressing/bathroom;Assistance with cooking/housework;Assist for transportation;Help with stairs or ramp for entrance   Can travel by private vehicle   Yes    Equipment Recommendations Rolling walker (2 wheels)  Recommendations for Other Services       Functional Status Assessment Patient has had a recent decline in their functional status and demonstrates the ability to make significant improvements in function in a reasonable and predictable amount of time.      Precautions / Restrictions Precautions Precautions: Fall Recall of Precautions/Restrictions: Intact Restrictions Weight Bearing Restrictions Per Provider Order: No      Mobility  Bed Mobility Overal bed mobility: Needs Assistance Bed Mobility: Supine to Sit, Sit to Supine     Supine to sit: Supervision Sit to supine: Supervision   General bed mobility comments: slow labored movement    Transfers Overall transfer level: Needs assistance Equipment used: 1 person hand held assist Transfers: Sit to/from Stand, Bed to chair/wheelchair/BSC Sit to Stand: Contact guard assist   Step pivot transfers: Contact guard assist, Min assist       General transfer comment: slow labored movement    Ambulation/Gait Ambulation/Gait assistance: Min assist Gait Distance (Feet): 40 Feet Assistive device: 1 person hand held assist Gait Pattern/deviations: Decreased step length - right, Decreased step length - left, Decreased stride length, Steppage, Narrow base of support Gait velocity: decreased     General Gait Details: slow labored movement with difficulty clearing toes when taking steps, frequent leaning on walls, nearby object for support due to weakness and limited mostly due to c/o fatigue and headache  Stairs            Wheelchair Mobility     Tilt Bed    Modified Rankin (Stroke Patients Only)       Balance                                             Pertinent Vitals/Pain Pain Assessment Pain Assessment: Faces Faces Pain Scale: Hurts little more Pain Location:  Headache Pain Descriptors / Indicators: Headache Pain Intervention(s): Monitored during session    Home Living Family/patient expects to be discharged to:: Private residence Living Arrangements: Other relatives Available Help at Discharge: Friend(s);Available PRN/intermittently Type of Home: House Home Access: Stairs to enter Entrance Stairs-Rails: Conservation officer, historic buildings of Steps: 2 steps in front, 6 steps in back Alternate Level Stairs-Number of Steps: full flight Home Layout: Multi-level Home Equipment: None      Prior Function Prior Level of Function : Independent/Modified Independent             Mobility Comments: Household and short distanced community ambulator without use of an AD, does not drive ADLs Comments: Independent     Extremity/Trunk Assessment   Upper Extremity Assessment Upper Extremity Assessment: Generalized weakness    Lower Extremity Assessment Lower Extremity Assessment: Generalized weakness    Cervical / Trunk Assessment Cervical / Trunk Assessment: Normal  Communication   Communication Communication: No apparent difficulties    Cognition Arousal: Alert Behavior During Therapy: WFL for tasks assessed/performed   PT - Cognitive impairments: No apparent impairments                         Following commands: Intact       Cueing Cueing Techniques: Verbal cues, Tactile cues     General Comments      Exercises     Assessment/Plan    PT Assessment Patient needs continued PT services  PT Problem List Decreased strength;Decreased activity tolerance;Decreased balance;Decreased mobility       PT Treatment Interventions DME instruction;Gait training;Stair training;Functional mobility training;Therapeutic activities;Therapeutic exercise;Balance training;Patient/family education    PT Goals (Current goals can be found in the Care Plan section)  Acute Rehab PT Goals Patient Stated Goal: return home after rehab PT Goal Formulation: With patient Time For Goal Achievement: 07/19/23 Potential to Achieve Goals: Good    Frequency Min 2X/week     Co-evaluation               AM-PAC PT "6 Clicks" Mobility  Outcome Measure Help needed turning from your back to your side while in a flat bed without using bedrails?: None Help needed moving from lying on your back to sitting on  the side of a flat bed without using bedrails?: A Little Help needed moving to and from a bed to a chair (including a wheelchair)?: A Little Help needed standing up from a chair using your arms (e.g., wheelchair or bedside chair)?: A Little Help needed to walk in hospital room?: A Little Help needed climbing 3-5 steps with a railing? : A Lot 6 Click Score: 18    End of Session   Activity Tolerance: Patient tolerated treatment well;Patient limited by fatigue Patient left: in bed;with call bell/phone within reach Nurse Communication: Mobility status PT Visit Diagnosis: Unsteadiness on feet (R26.81);Other abnormalities of gait and mobility (R26.89);Muscle weakness (generalized) (M62.81)    Time: 1610-9604 PT Time Calculation (min) (ACUTE ONLY): 12 min   Charges:   PT Evaluation $PT Eval Low Complexity: 1 Low PT Treatments $Therapeutic Activity: 8-22 mins PT General Charges $$ ACUTE PT VISIT: 1 Visit         12:00 PM, 07/05/23 Walton Guppy, MPT Physical Therapist with [facility] [phone] office [phone] mobile phone

## 2023-07-05 NOTE — TOC Progression Note (Signed)
 Transition of Care [facility]) - Progression Note    Patient Details  Name: Michael [name] MRN: [id number] Date of Birth: 1955/12/01  [facility] CM/SW Contact  Joslyn Nim, RN Phone Number: 07/05/2023, 11:48 AM  Clinical Narrative:    CM received a message that therapy recommend SNF. Cm spoke with patient via  phone to inform him of therapy's recommendation. Patient is in agreement with referrals being sent out. He has no preference. CM asked about any family. He states he has a son Michael [name]  but patient does not have his number. Patient was informed that [facility] states they cannot provide care for him. CM asked about his monthly income. Patient states it's $1000/month.  CM asked if he owned any property or vehicles. Patient said no. Referrals sent out        Expected Discharge Plan and Services                                               Social Determinants of Health (SDOH) Interventions SDOH Screenings   Food Insecurity: No Food Insecurity (07/04/2023)  Housing: Low Risk  (07/04/2023)  Transportation Needs: No Transportation Needs (07/04/2023)  Utilities: Not At Risk (07/04/2023)  Alcohol Screen: Low Risk  (12/26/2022)  Depression (PHQ2-9): Low Risk  (07/04/2023)  Financial Resource Strain: Low Risk  (12/26/2022)  Physical Activity: Sufficiently Active (12/26/2022)  Social Connections: Moderately Isolated (06/28/2023)  Stress: No Stress Concern Present (12/26/2022)  Tobacco Use: Medium Risk (07/04/2023)  Health Literacy: Adequate Health Literacy (12/26/2022)    Readmission Risk Interventions    05/10/2023    9:47 PM 04/19/2023    9:18 AM 04/02/2023    4:13 PM  Readmission Risk Prevention Plan  Transportation Screening Complete Complete Complete  Medication Review (RN Care Manager) Complete Complete Complete  PCP or Specialist appointment within 3-5 days of discharge Complete  Complete  HRI or Home Care Consult Complete Complete Complete  SW  Recovery Care/Counseling Consult Complete Complete Complete  Palliative Care Screening Not Applicable Not Applicable Not Applicable  Skilled Nursing Facility Not Applicable Not Applicable Not Applicable

## 2023-07-05 NOTE — TOC Progression Note (Signed)
 Transition of Care [facility]) - Progression Note    Patient Details  Name: Michael [name] MRN: [id number] Date of Birth: [birth year]  Transition of Care [facility]) CM/SW Contact  Joslyn Nim, RN Phone Number: 07/05/2023, 2:15 PM  Clinical Narrative:     Cm spoke with patient about [facility] in [place] has  made a bed offer and [facility] rehab & [facility] are considering.  Patient is in agreement with insurance auth being started. CM asked Dr. Duwayne Ginsberg if patient is medically ready for d/c. He states yes       Expected Discharge Plan and Services                                               Social Determinants of Health (SDOH) Interventions SDOH Screenings   Food Insecurity: No Food Insecurity (07/04/2023)  Housing: Low Risk  (07/04/2023)  Transportation Needs: No Transportation Needs (07/04/2023)  Utilities: Not At Risk (07/04/2023)  Alcohol Screen: Low Risk  (12/26/2022)  Depression (PHQ2-9): Low Risk  (07/04/2023)  Financial Resource Strain: Low Risk  (12/26/2022)  Physical Activity: Sufficiently Active (12/26/2022)  Social Connections: Moderately Isolated (06/28/2023)  Stress: No Stress Concern Present (12/26/2022)  Tobacco Use: Medium Risk (07/04/2023)  Health Literacy: Adequate Health Literacy (12/26/2022)    Readmission Risk Interventions    05/10/2023    9:47 PM 04/19/2023    9:18 AM 04/02/2023    4:13 PM  Readmission Risk Prevention Plan  Transportation Screening Complete Complete Complete  Medication Review (RN Care Manager) Complete Complete Complete  PCP or Specialist appointment within 3-5 days of discharge Complete  Complete  HRI or Home Care Consult Complete Complete Complete  SW Recovery Care/Counseling Consult Complete Complete Complete  Palliative Care Screening Not Applicable Not Applicable Not Applicable  Skilled Nursing Facility Not Applicable Not Applicable Not Applicable

## 2023-07-05 NOTE — ED Notes (Signed)
PT at bedside evaluating pt.

## 2023-07-05 NOTE — Care Management (Signed)
 CM spoke with patient via phone. Patient states that he lives with the [place] [facility]. CM asked about any services in the home and DME usage. . Patient denies [facility] service and DME . Cm asked if patient has ever been in a facility for rehab. Patient stated no. CM asked if he can do this ADLs. Patient states he can dress and feed himself and Foy Imam cooks his food. Cm asked if he can return to the [facility] when he is ready for d/c. He stated yes. CM asked if he pays rent. Patient did not answer the question. CM asked for permission to confirm with the [facility] that patient can return. Patient was in agreement with CM calling the [facility].   Cm called [facility]  [phone]. Foy Imam answered the phone. CM asked how long patient has been living with them . She states since August 2023. CM asked if he pays rent. She states no. CM asked what was the reason for patient staying with them. 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 He was evicted out of his home which he was renting.  CM asked what is a typical day for patient. She states that he get out of bed ands sit in a chair all day. She states that since last year she notice that patient was having memory issues. He did not remember to take his medications , he would urinate on himself.  She states that they cannot take care of him. CM asked if they have verbally or in written told patient that they will not accept hm back and  cannot take care of him. She states that she told him yesterday. She denies HH services and DME for patient.  CM asked about any family. She states that patient has a son. His son does not want any dealings with patient. CM asked if they are his POA and do they know his financial information. She states no. She states that they have tried to get him place in a facility but no success. CM asked what was the barriers for getting him placed. She states that some said he did not qualify and others did not call back. CM asked if they talked to  his PCP, Dr. Del Favia about placement. She states yes but still was unsuccessful.  CM asked if they will be  a decision maker for patient. She said no.  CM notified nurse and provider via secure chat. PT has been ordered.

## 2023-07-05 NOTE — ED Notes (Signed)
 Per Collene Dawson, RN, spoke with Mrs. Michael [name] . They will not accept him back. Patient has a son but the son does not want any dealings with the patient

## 2023-07-05 NOTE — ED Notes (Addendum)
 Per pharmacy tech, pt's family [phone]) Foy Imam hung up and states she does not want anything to do with the pt - therefore we are unable to complete med rec

## 2023-07-05 NOTE — Plan of Care (Signed)
  Problem: Acute Rehab PT Goals(only PT should resolve) Goal: Pt Will Go Supine/Side To Sit Outcome: Progressing Flowsheets (Taken 07/05/2023 1201) Pt will go Supine/Side to Sit: with modified independence Goal: Patient Will Transfer Sit To/From Stand Outcome: Progressing Flowsheets (Taken 07/05/2023 1201) Patient will transfer sit to/from stand:  with modified independence  with supervision Goal: Pt Will Transfer Bed To Chair/Chair To Bed Outcome: Progressing Flowsheets (Taken 07/05/2023 1201) Pt will Transfer Bed to Chair/Chair to Bed:  with modified independence  with supervision Goal: Pt Will Ambulate Outcome: Progressing Flowsheets (Taken 07/05/2023 1201) Pt will Ambulate:  75 feet  with modified independence  with supervision  with least restrictive assistive device   12:02 PM, 07/05/23 Walton Guppy, MPT Physical Therapist with [facility] [phone] office [phone] mobile phone

## 2023-07-05 NOTE — ED Notes (Signed)
 Pt has now had 2 episodes of bowel incontinence. Pt now set up taking shower and provided with clean clothing.

## 2023-07-05 NOTE — ED Notes (Signed)
 Pt c/o HA - requesting pain meds. Dr. Aldean Amass notified

## 2023-07-05 NOTE — [place] FL2 (Signed)
 [place]  MEDICAID FL2 LEVEL OF CARE FORM     IDENTIFICATION  Patient Name: Jaquise [name] Birthdate: [birth year] Sex: male Admission Date (Current Location): 07/04/2023  [facility] and [place] Number:  [employer] and Address:  [facility],  [address] [place] [address]      Provider Number: [id number]  Attending Physician Name and Address:  No att. providers found  Relative Name and Phone Number:       Current Level of Care: Hospital Recommended Level of Care: Skilled Nursing Facility Prior Approval Number:    Date Approved/Denied: 08/16/22 PASRR Number: [id number]  Discharge Plan: SNF    Current Diagnoses: Patient Active Problem List   Diagnosis Date Noted   Acute exacerbation of CHF (congestive heart failure) (HCC) 07/01/2023   AAA (abdominal aortic aneurysm) without rupture (HCC) 06/06/2023   Hypertensive urgency 05/10/2023   Acute on chronic combined systolic (congestive) and diastolic (congestive) heart failure (HCC) 04/18/2023   Atrial fibrillation (HCC) 02/05/2023   Body mass index (BMI) 27.0-27.9, adult 12/01/2022   H/O prostatectomy 12/01/2022   Calculus of gallbladder without cholecystitis without obstruction 08/10/2022   Nicotine  dependence, cigarettes, uncomplicated 05/17/2022   Noncompliance with treatment plan 05/17/2022   Vitamin D  deficiency 05/17/2022   Elevated brain natriuretic peptide (BNP) level 04/29/2022   Solitary pulmonary nodule 04/26/2022   First degree heart block 04/12/2022   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   COPD (chronic obstructive pulmonary disease) (HCC) 11/03/2021   Aneurysm of infrarenal abdominal aorta (HCC) 11/03/2021   Functional gait abnormality 11/03/2021   Mixed hyperlipidemia 11/03/2021   Moderate protein-calorie malnutrition (HCC) 11/03/2021   Proteinuria 11/03/2021   Atrophy of pancreas 08/11/2021   CAD (coronary artery disease) 08/03/2021   CKD stage 3b, GFR 30-44 ml/min  (HCC) 08/03/2021   Hardening of the aorta (main artery of the heart) (HCC) 08/03/2021   History of adenomatous polyp of colon 06/16/2021   Carotid stenosis, right 02/15/2021   Occlusion and stenosis of right carotid artery 02/15/2021   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 12/06/2019   Incisional hernia 11/15/2019   Benign prostatic hyperplasia with urinary obstruction 10/29/2018   Chronic combined systolic and diastolic CHF (congestive heart failure) (HCC) 05/09/2018   COPD with acute exacerbation (HCC) 07/05/2017   Acute kidney injury superimposed on CKD (HCC) 07/05/2017   Disorder of carotid artery (HCC) 06/25/2017   Syncope due to orthostatic hypotension 06/24/2017   Cardiac murmur 06/24/2017   Prolonged QT interval 06/24/2017   Basal cell carcinoma of eyelid 05/15/2017   Basal cell carcinoma (BCC) of nostril 05/12/2017   Skin lesion of face 05/03/2017   Iron  deficiency anemia 04/04/2017   Essential (primary) hypertension 04/02/2017   Anemia of chronic disease 04/02/2017   Tobacco abuse 04/02/2017   Hypokalemia 04/02/2017    Orientation RESPIRATION BLADDER Height & Weight     Self, Time, Situation, Place  Normal Continent Weight: 64.3 kg Height:  6' (182.9 cm)  BEHAVIORAL SYMPTOMS/MOOD NEUROLOGICAL BOWEL NUTRITION STATUS      Continent Diet (regular)  AMBULATORY STATUS COMMUNICATION OF NEEDS Skin   Limited Assist Verbally                         Personal Care Assistance Level of Assistance  Bathing, Feeding, Dressing Bathing Assistance: Limited assistance Feeding assistance: Limited assistance Dressing Assistance: Limited assistance     Functional Limitations Info  Hearing   Hearing Info: Impaired  SPECIAL CARE FACTORS FREQUENCY  PT (By licensed PT), OT (By licensed OT)     PT Frequency: 5days/week OT Frequency: 5 days/week            Contractures Contractures Info: Not present    Additional Factors Info  Code Status, Allergies  Code Status Info: Full Code Allergies Info: None but patient report Orthostatic Hypotension w/ Entresto  , Sglt2 inhibitor reportUTI's           Current Medications (07/05/2023):  This is the current hospital active medication list Current Facility-Administered Medications  Medication Dose Route Frequency Provider Last Rate Last Admin   acetaminophen  (TYLENOL ) tablet 650 mg  650 mg Oral Q4H PRN Jerilynn Montenegro, MD       apixaban  (ELIQUIS ) tablet 5 mg  5 mg Oral BID Kommor, Madison, MD   5 mg at 07/05/23 1610   atorvastatin  (LIPITOR ) tablet 40 mg  40 mg Oral QPM Kommor, Madison, MD   40 mg at 07/04/23 2225   carvedilol  (COREG ) tablet 25 mg  25 mg Oral BID WC Kommor, Madison, MD   25 mg at 07/05/23 9604   dicyclomine  (BENTYL ) capsule 20 mg  20 mg Oral BID Kommor, Madison, MD   20 mg at 07/05/23 5409   doxycycline  (VIBRA -TABS) tablet 100 mg  100 mg Oral BID Kommor, Madison, MD   100 mg at 07/05/23 8119   insulin  aspart (novoLOG ) injection 0-15 Units  0-15 Units Subcutaneous TID WC Kommor, Madison, MD       insulin  aspart (novoLOG ) injection 0-5 Units  0-5 Units Subcutaneous QHS Kommor, Madison, MD   4 Units at 07/04/23 2226   losartan  (COZAAR ) tablet 25 mg  25 mg Oral Daily Kommor, Madison, MD   25 mg at 07/05/23 1478   potassium chloride  SA (KLOR-CON  M) CR tablet 20 mEq  20 mEq Oral BID Kommor, Madison, MD   20 mEq at 07/05/23 2956   predniSONE  (DELTASONE ) tablet 20 mg  20 mg Oral Q breakfast Kommor, Madison, MD   20 mg at 07/05/23 0920   torsemide  (DEMADEX ) tablet 20 mg  20 mg Oral QPM Kommor, Madison, MD   20 mg at 07/04/23 2302   torsemide  (DEMADEX ) tablet 40 mg  40 mg Oral QAC breakfast Kommor, Madison, MD   40 mg at 07/05/23 2130   Vericiguat  TABS 5 mg  5 mg Oral Daily Kommor, Madison, MD       Current Outpatient Medications  Medication Sig Dispense Refill   apixaban  (ELIQUIS ) 5 MG TABS tablet Take 1 tablet (5 mg total) by mouth 2 (two) times daily. 60 tablet 5   atorvastatin  (LIPITOR ) 40  MG tablet Take 1 tablet (40 mg total) by mouth every evening. (Patient taking differently: Take 40 mg by mouth daily.) 30 tablet 1   budesonide -glycopyrrolate -formoterol  (BREZTRI  AEROSPHERE) 160-9-4.8 MCG/ACT AERO inhaler Inhale 2 puffs into the lungs 2 (two) times daily. 10.7 g 3   carvedilol  (COREG ) 25 MG tablet Take 1 tablet (25 mg total) by mouth 2 (two) times daily with a meal. 60 tablet 3   doxycycline  (VIBRA -TABS) 100 MG tablet Take 1 tablet (100 mg total) by mouth 2 (two) times daily for 5 days. 10 tablet 0   losartan  (COZAAR ) 25 MG tablet Take 1 tablet (25 mg total) by mouth daily. 30 tablet 5   nicotine  (NICODERM CQ  - DOSED IN MG/24 HOURS) 21 mg/24hr patch Place 1 patch (21 mg total) onto the skin daily. 28 patch 0   nitroGLYCERIN  (NITROSTAT ) 0.4 MG  SL tablet Place 1 tablet (0.4 mg total) under the tongue every 5 (five) minutes as needed for chest pain. 30 tablet 0   potassium chloride  SA (KLOR-CON  M) 20 MEQ tablet Take 1 tablet (20 mEq total) by mouth 2 (two) times daily. Take Potassium Tablet while Taking Demadex /Torsemide  60 tablet 2   torsemide  (DEMADEX ) 20 MG tablet Take 1 tablet (20 mg total) by mouth See admin instructions. Take 2 Tabs (40 mg) q am and 1 tab (20 mg) QPM-- 90 tablet 5   Vericiguat  (VERQUVO ) 5 MG TABS Take 1 tablet (5 mg total) by mouth daily. 90 tablet 3   dicyclomine  (BENTYL ) 20 MG tablet Take 1 tablet (20 mg total) by mouth 2 (two) times daily. (Patient not taking: Reported on 07/05/2023) 10 tablet 0   predniSONE  (DELTASONE ) 20 MG tablet Take 1 tablet (20 mg total) by mouth daily with breakfast for 5 days. 5 tablet 0     Discharge Medications: Please see discharge summary for a list of discharge medications.  Relevant Imaging Results:  Relevant Lab Results:   Additional Information SSN [id number]  Joslyn Nim, RN

## 2023-07-06 DIAGNOSIS — Z7951 Long term (current) use of inhaled steroids: Secondary | ICD-10-CM | POA: Diagnosis not present

## 2023-07-06 DIAGNOSIS — I509 Heart failure, unspecified: Secondary | ICD-10-CM | POA: Diagnosis not present

## 2023-07-06 DIAGNOSIS — J449 Chronic obstructive pulmonary disease, unspecified: Secondary | ICD-10-CM | POA: Diagnosis not present

## 2023-07-06 DIAGNOSIS — R42 Dizziness and giddiness: Secondary | ICD-10-CM | POA: Diagnosis not present

## 2023-07-06 DIAGNOSIS — Y92129 Unspecified place in nursing home as the place of occurrence of the external cause: Secondary | ICD-10-CM | POA: Diagnosis not present

## 2023-07-06 DIAGNOSIS — I714 Abdominal aortic aneurysm, without rupture, unspecified: Secondary | ICD-10-CM | POA: Diagnosis not present

## 2023-07-06 DIAGNOSIS — R278 Other lack of coordination: Secondary | ICD-10-CM | POA: Diagnosis not present

## 2023-07-06 DIAGNOSIS — S00431A Contusion of right ear, initial encounter: Secondary | ICD-10-CM | POA: Diagnosis not present

## 2023-07-06 DIAGNOSIS — E559 Vitamin D deficiency, unspecified: Secondary | ICD-10-CM | POA: Diagnosis not present

## 2023-07-06 DIAGNOSIS — R739 Hyperglycemia, unspecified: Secondary | ICD-10-CM | POA: Diagnosis not present

## 2023-07-06 DIAGNOSIS — S199XXA Unspecified injury of neck, initial encounter: Secondary | ICD-10-CM | POA: Diagnosis not present

## 2023-07-06 DIAGNOSIS — I499 Cardiac arrhythmia, unspecified: Secondary | ICD-10-CM | POA: Diagnosis not present

## 2023-07-06 DIAGNOSIS — I700 Atherosclerosis of aorta: Secondary | ICD-10-CM | POA: Diagnosis not present

## 2023-07-06 DIAGNOSIS — R600 Localized edema: Secondary | ICD-10-CM | POA: Diagnosis not present

## 2023-07-06 DIAGNOSIS — R0602 Shortness of breath: Secondary | ICD-10-CM | POA: Diagnosis not present

## 2023-07-06 DIAGNOSIS — I440 Atrioventricular block, first degree: Secondary | ICD-10-CM | POA: Diagnosis not present

## 2023-07-06 DIAGNOSIS — D500 Iron deficiency anemia secondary to blood loss (chronic): Secondary | ICD-10-CM | POA: Diagnosis not present

## 2023-07-06 DIAGNOSIS — I5023 Acute on chronic systolic (congestive) heart failure: Secondary | ICD-10-CM | POA: Diagnosis not present

## 2023-07-06 DIAGNOSIS — I517 Cardiomegaly: Secondary | ICD-10-CM | POA: Diagnosis not present

## 2023-07-06 DIAGNOSIS — R0689 Other abnormalities of breathing: Secondary | ICD-10-CM | POA: Diagnosis not present

## 2023-07-06 DIAGNOSIS — R1031 Right lower quadrant pain: Secondary | ICD-10-CM | POA: Diagnosis not present

## 2023-07-06 DIAGNOSIS — Z8589 Personal history of malignant neoplasm of other organs and systems: Secondary | ICD-10-CM | POA: Diagnosis not present

## 2023-07-06 DIAGNOSIS — I130 Hypertensive heart and chronic kidney disease with heart failure and stage 1 through stage 4 chronic kidney disease, or unspecified chronic kidney disease: Secondary | ICD-10-CM | POA: Diagnosis not present

## 2023-07-06 DIAGNOSIS — E782 Mixed hyperlipidemia: Secondary | ICD-10-CM | POA: Diagnosis not present

## 2023-07-06 DIAGNOSIS — Z85818 Personal history of malignant neoplasm of other sites of lip, oral cavity, and pharynx: Secondary | ICD-10-CM | POA: Diagnosis not present

## 2023-07-06 DIAGNOSIS — J441 Chronic obstructive pulmonary disease with (acute) exacerbation: Secondary | ICD-10-CM | POA: Diagnosis not present

## 2023-07-06 DIAGNOSIS — R262 Difficulty in walking, not elsewhere classified: Secondary | ICD-10-CM | POA: Diagnosis not present

## 2023-07-06 DIAGNOSIS — I959 Hypotension, unspecified: Secondary | ICD-10-CM | POA: Diagnosis not present

## 2023-07-06 DIAGNOSIS — S6992XA Unspecified injury of left wrist, hand and finger(s), initial encounter: Secondary | ICD-10-CM | POA: Diagnosis present

## 2023-07-06 DIAGNOSIS — S3991XA Unspecified injury of abdomen, initial encounter: Secondary | ICD-10-CM | POA: Diagnosis not present

## 2023-07-06 DIAGNOSIS — N1832 Chronic kidney disease, stage 3b: Secondary | ICD-10-CM | POA: Diagnosis present

## 2023-07-06 DIAGNOSIS — E440 Moderate protein-calorie malnutrition: Secondary | ICD-10-CM | POA: Diagnosis not present

## 2023-07-06 DIAGNOSIS — M160 Bilateral primary osteoarthritis of hip: Secondary | ICD-10-CM | POA: Diagnosis not present

## 2023-07-06 DIAGNOSIS — I469 Cardiac arrest, cause unspecified: Secondary | ICD-10-CM | POA: Diagnosis not present

## 2023-07-06 DIAGNOSIS — Z87891 Personal history of nicotine dependence: Secondary | ICD-10-CM | POA: Diagnosis not present

## 2023-07-06 DIAGNOSIS — S3993XA Unspecified injury of pelvis, initial encounter: Secondary | ICD-10-CM | POA: Diagnosis not present

## 2023-07-06 DIAGNOSIS — Z9181 History of falling: Secondary | ICD-10-CM | POA: Diagnosis not present

## 2023-07-06 DIAGNOSIS — D485 Neoplasm of uncertain behavior of skin: Secondary | ICD-10-CM | POA: Diagnosis not present

## 2023-07-06 DIAGNOSIS — D631 Anemia in chronic kidney disease: Secondary | ICD-10-CM | POA: Diagnosis present

## 2023-07-06 DIAGNOSIS — K8020 Calculus of gallbladder without cholecystitis without obstruction: Secondary | ICD-10-CM | POA: Diagnosis not present

## 2023-07-06 DIAGNOSIS — R6889 Other general symptoms and signs: Secondary | ICD-10-CM | POA: Diagnosis not present

## 2023-07-06 DIAGNOSIS — S0990XA Unspecified injury of head, initial encounter: Secondary | ICD-10-CM | POA: Diagnosis not present

## 2023-07-06 DIAGNOSIS — I5043 Acute on chronic combined systolic (congestive) and diastolic (congestive) heart failure: Secondary | ICD-10-CM | POA: Diagnosis not present

## 2023-07-06 DIAGNOSIS — S7001XA Contusion of right hip, initial encounter: Secondary | ICD-10-CM | POA: Diagnosis not present

## 2023-07-06 DIAGNOSIS — S61412A Laceration without foreign body of left hand, initial encounter: Secondary | ICD-10-CM | POA: Diagnosis not present

## 2023-07-06 DIAGNOSIS — D696 Thrombocytopenia, unspecified: Secondary | ICD-10-CM | POA: Diagnosis not present

## 2023-07-06 DIAGNOSIS — I4891 Unspecified atrial fibrillation: Secondary | ICD-10-CM | POA: Diagnosis not present

## 2023-07-06 DIAGNOSIS — I160 Hypertensive urgency: Secondary | ICD-10-CM | POA: Diagnosis not present

## 2023-07-06 DIAGNOSIS — W19XXXA Unspecified fall, initial encounter: Secondary | ICD-10-CM | POA: Diagnosis not present

## 2023-07-06 DIAGNOSIS — N183 Chronic kidney disease, stage 3 unspecified: Secondary | ICD-10-CM | POA: Diagnosis not present

## 2023-07-06 DIAGNOSIS — S299XXA Unspecified injury of thorax, initial encounter: Secondary | ICD-10-CM | POA: Diagnosis not present

## 2023-07-06 DIAGNOSIS — D509 Iron deficiency anemia, unspecified: Secondary | ICD-10-CM | POA: Diagnosis present

## 2023-07-06 DIAGNOSIS — Q441 Other congenital malformations of gallbladder: Secondary | ICD-10-CM | POA: Diagnosis not present

## 2023-07-06 DIAGNOSIS — Z955 Presence of coronary angioplasty implant and graft: Secondary | ICD-10-CM | POA: Diagnosis not present

## 2023-07-06 DIAGNOSIS — D649 Anemia, unspecified: Secondary | ICD-10-CM | POA: Diagnosis not present

## 2023-07-06 DIAGNOSIS — R296 Repeated falls: Secondary | ICD-10-CM | POA: Diagnosis not present

## 2023-07-06 DIAGNOSIS — G319 Degenerative disease of nervous system, unspecified: Secondary | ICD-10-CM | POA: Diagnosis not present

## 2023-07-06 DIAGNOSIS — Z85828 Personal history of other malignant neoplasm of skin: Secondary | ICD-10-CM | POA: Diagnosis not present

## 2023-07-06 DIAGNOSIS — Z79899 Other long term (current) drug therapy: Secondary | ICD-10-CM | POA: Diagnosis not present

## 2023-07-06 DIAGNOSIS — W1839XA Other fall on same level, initial encounter: Secondary | ICD-10-CM | POA: Diagnosis not present

## 2023-07-06 DIAGNOSIS — D72829 Elevated white blood cell count, unspecified: Secondary | ICD-10-CM | POA: Diagnosis not present

## 2023-07-06 DIAGNOSIS — Z043 Encounter for examination and observation following other accident: Secondary | ICD-10-CM | POA: Diagnosis not present

## 2023-07-06 DIAGNOSIS — S51011A Laceration without foreign body of right elbow, initial encounter: Secondary | ICD-10-CM | POA: Diagnosis not present

## 2023-07-06 DIAGNOSIS — Z7401 Bed confinement status: Secondary | ICD-10-CM | POA: Diagnosis not present

## 2023-07-06 DIAGNOSIS — R109 Unspecified abdominal pain: Secondary | ICD-10-CM | POA: Diagnosis present

## 2023-07-06 DIAGNOSIS — K8689 Other specified diseases of pancreas: Secondary | ICD-10-CM | POA: Diagnosis not present

## 2023-07-06 DIAGNOSIS — S7011XA Contusion of right thigh, initial encounter: Secondary | ICD-10-CM | POA: Diagnosis not present

## 2023-07-06 DIAGNOSIS — Z7901 Long term (current) use of anticoagulants: Secondary | ICD-10-CM | POA: Diagnosis not present

## 2023-07-06 DIAGNOSIS — Z743 Need for continuous supervision: Secondary | ICD-10-CM | POA: Diagnosis not present

## 2023-07-06 DIAGNOSIS — I10 Essential (primary) hypertension: Secondary | ICD-10-CM | POA: Diagnosis not present

## 2023-07-06 DIAGNOSIS — R5383 Other fatigue: Secondary | ICD-10-CM | POA: Diagnosis present

## 2023-07-06 DIAGNOSIS — R404 Transient alteration of awareness: Secondary | ICD-10-CM | POA: Diagnosis not present

## 2023-07-06 DIAGNOSIS — S3022XA Contusion of scrotum and testes, initial encounter: Secondary | ICD-10-CM | POA: Diagnosis not present

## 2023-07-06 DIAGNOSIS — X58XXXA Exposure to other specified factors, initial encounter: Secondary | ICD-10-CM | POA: Diagnosis not present

## 2023-07-06 DIAGNOSIS — C44311 Basal cell carcinoma of skin of nose: Secondary | ICD-10-CM | POA: Diagnosis not present

## 2023-07-06 DIAGNOSIS — R531 Weakness: Secondary | ICD-10-CM | POA: Diagnosis not present

## 2023-07-06 DIAGNOSIS — I2510 Atherosclerotic heart disease of native coronary artery without angina pectoris: Secondary | ICD-10-CM | POA: Diagnosis not present

## 2023-07-06 DIAGNOSIS — S51012A Laceration without foreign body of left elbow, initial encounter: Secondary | ICD-10-CM | POA: Diagnosis not present

## 2023-07-06 LAB — CBG MONITORING, ED
Glucose-Capillary: 148 mg/dL — ABNORMAL HIGH (ref 70–99)
Glucose-Capillary: 192 mg/dL — ABNORMAL HIGH (ref 70–99)

## 2023-07-06 NOTE — ED Notes (Signed)
 This tech was called by the charge nurse to come help assist with this pt whom had fallen in the floor. We cleaned up the area while RN looked over pt before assisting him up. RN and this tech took a second look over pt while standing him. Pt complained of his back, was a small indent in the middle of his back. Pt was assisted to the bed and fall mat alarm was turned on and call light was given.

## 2023-07-06 NOTE — ED Notes (Signed)
 [facility] called to transport patient at this time. Nurse aware.

## 2023-07-06 NOTE — ED Provider Notes (Signed)
 Patient experienced a fall while in the ER. He states he simply slipped on the floor and fell onto his left shoulder. He denies hitting his head or losing consciousness.  He also denies any pain from the fall.  There are no signs of trauma to his back.  He does not have any signs of trauma on physical exam.  Patient is vitally stable, very well-appearing overall.  Patient is excepted with the transfer to [facility] in [place]. Medically stable for DC.   Merdis Stalling, MD 07/06/23 [phone]

## 2023-07-06 NOTE — ED Notes (Signed)
 EDP assessed pt from fall.

## 2023-07-06 NOTE — ED Notes (Addendum)
 Took pt a drink, he was sitting on the bed watching tv. Within the last few minutes pt fell and was laying on his side in the floor, stated "I wanted to walk around". Got pt cleaned up and examined for new injury's, pt does have an indent in his back from falling near the computer in the room. States the only pain he is having is back pain. Pt linen clean and reset bed alarm, raised bed rails, and elevated the bottom of the bed. Pt does have call light and instructed to call when he would like to get out of the bed and we would be there to help him. Charge RN in the room with this Clinical research associate. EVS to come mop the room from spilt drink.

## 2023-07-06 NOTE — Discharge Instructions (Addendum)
 Thank you for coming to [facility] Emergency Department. You are being transferred to [facility]. Please take all of your medication as prescribed.  Please follow up with your primary care provider within 1 week.   Do not hesitate to return to the ED or call 911 if you experience: -Worsening symptoms -Lightheadedness, passing out -Fevers/chills -Anything else that concerns you

## 2023-07-06 NOTE — ED Notes (Signed)
 Report given to Darshana

## 2023-07-06 NOTE — ED Notes (Addendum)
 Patient Michael [name] came back approved. Writer reached out to [employer] in [place] and spoke with Chacra. Writer shared with Michael [name] that patient Michael [name] came back today. Michael [name] stated that she had to see about her bed situation with discharges and will get back with Clinical research associate. TOC to follow.    Addendum 11:14 AM   Michael [name] got back with Clinical research associate and stated that they have a bed today to accept patient. MD and nurse made aware and was provided with room number and number to call report. TOC signing off.

## 2023-07-06 NOTE — ED Notes (Signed)
 Pt sleeping. Chest rise and fall noted. Will assess when pt wakes.

## 2023-07-06 NOTE — ED Notes (Signed)
 Pt a/o. Eating breakfast now. Nad. Advised will go to bathroom after eating and will change bed linen and possible shower

## 2023-07-06 NOTE — ED Notes (Signed)
 Nurse heard loud noise in room and found pt in the floor on right side with nonskid socks on with his right hand covering his head. Pt stated he was attempting to use the urinal. Nurse noted pt had dropped his soda with ice right under him. Nurse assessed pt and pt is able to move all extremities, No bleeding or skin tears noted. Pt was able to stand up with assistance and ambulate back over to the bed. Fall alarm placed and turned on . Pt reminded to ask for help before getting up. Pt stated he did not know that but he would from now on. EDP aware of fall.

## 2023-07-06 NOTE — ED Notes (Signed)
 Attempted to call Michael [name] to update about the fall but it went straight to voicemail.

## 2023-07-06 NOTE — ED Notes (Signed)
 Gave pt zero sugar cola.

## 2023-07-07 ENCOUNTER — Other Ambulatory Visit: Payer: Self-pay

## 2023-07-07 ENCOUNTER — Emergency Department (HOSPITAL_COMMUNITY)

## 2023-07-07 ENCOUNTER — Emergency Department (HOSPITAL_COMMUNITY)
Admission: EM | Admit: 2023-07-07 | Discharge: 2023-07-08 | Disposition: A | Discharge status: Home / Self Care | Attending: Emergency Medicine | Admitting: Emergency Medicine

## 2023-07-07 ENCOUNTER — Encounter (HOSPITAL_COMMUNITY): Payer: Self-pay | Admitting: Emergency Medicine

## 2023-07-07 DIAGNOSIS — Z79899 Other long term (current) drug therapy: Secondary | ICD-10-CM | POA: Diagnosis not present

## 2023-07-07 DIAGNOSIS — Z8589 Personal history of malignant neoplasm of other organs and systems: Secondary | ICD-10-CM | POA: Insufficient documentation

## 2023-07-07 DIAGNOSIS — S3993XA Unspecified injury of pelvis, initial encounter: Secondary | ICD-10-CM | POA: Diagnosis not present

## 2023-07-07 DIAGNOSIS — I10 Essential (primary) hypertension: Secondary | ICD-10-CM | POA: Diagnosis not present

## 2023-07-07 DIAGNOSIS — I700 Atherosclerosis of aorta: Secondary | ICD-10-CM | POA: Diagnosis not present

## 2023-07-07 DIAGNOSIS — Z85828 Personal history of other malignant neoplasm of skin: Secondary | ICD-10-CM | POA: Diagnosis not present

## 2023-07-07 DIAGNOSIS — I2510 Atherosclerotic heart disease of native coronary artery without angina pectoris: Secondary | ICD-10-CM | POA: Diagnosis not present

## 2023-07-07 DIAGNOSIS — Z043 Encounter for examination and observation following other accident: Secondary | ICD-10-CM | POA: Diagnosis not present

## 2023-07-07 DIAGNOSIS — S0990XA Unspecified injury of head, initial encounter: Secondary | ICD-10-CM | POA: Diagnosis not present

## 2023-07-07 DIAGNOSIS — D485 Neoplasm of uncertain behavior of skin: Secondary | ICD-10-CM | POA: Diagnosis not present

## 2023-07-07 DIAGNOSIS — S00431A Contusion of right ear, initial encounter: Secondary | ICD-10-CM | POA: Diagnosis not present

## 2023-07-07 DIAGNOSIS — J449 Chronic obstructive pulmonary disease, unspecified: Secondary | ICD-10-CM | POA: Diagnosis not present

## 2023-07-07 DIAGNOSIS — Y92129 Unspecified place in nursing home as the place of occurrence of the external cause: Secondary | ICD-10-CM | POA: Diagnosis not present

## 2023-07-07 DIAGNOSIS — G319 Degenerative disease of nervous system, unspecified: Secondary | ICD-10-CM | POA: Diagnosis not present

## 2023-07-07 DIAGNOSIS — W19XXXA Unspecified fall, initial encounter: Secondary | ICD-10-CM | POA: Insufficient documentation

## 2023-07-07 DIAGNOSIS — Z9181 History of falling: Secondary | ICD-10-CM | POA: Diagnosis not present

## 2023-07-07 DIAGNOSIS — I160 Hypertensive urgency: Secondary | ICD-10-CM | POA: Diagnosis not present

## 2023-07-07 DIAGNOSIS — R739 Hyperglycemia, unspecified: Secondary | ICD-10-CM | POA: Diagnosis not present

## 2023-07-07 DIAGNOSIS — S6992XA Unspecified injury of left wrist, hand and finger(s), initial encounter: Secondary | ICD-10-CM | POA: Diagnosis present

## 2023-07-07 DIAGNOSIS — S51011A Laceration without foreign body of right elbow, initial encounter: Secondary | ICD-10-CM | POA: Insufficient documentation

## 2023-07-07 DIAGNOSIS — Z7901 Long term (current) use of anticoagulants: Secondary | ICD-10-CM | POA: Insufficient documentation

## 2023-07-07 DIAGNOSIS — J441 Chronic obstructive pulmonary disease with (acute) exacerbation: Secondary | ICD-10-CM | POA: Diagnosis not present

## 2023-07-07 DIAGNOSIS — C4490 Unspecified malignant neoplasm of skin, unspecified: Secondary | ICD-10-CM

## 2023-07-07 DIAGNOSIS — S3991XA Unspecified injury of abdomen, initial encounter: Secondary | ICD-10-CM | POA: Diagnosis not present

## 2023-07-07 DIAGNOSIS — I130 Hypertensive heart and chronic kidney disease with heart failure and stage 1 through stage 4 chronic kidney disease, or unspecified chronic kidney disease: Secondary | ICD-10-CM | POA: Diagnosis not present

## 2023-07-07 DIAGNOSIS — W1839XA Other fall on same level, initial encounter: Secondary | ICD-10-CM | POA: Diagnosis not present

## 2023-07-07 DIAGNOSIS — M160 Bilateral primary osteoarthritis of hip: Secondary | ICD-10-CM | POA: Diagnosis not present

## 2023-07-07 DIAGNOSIS — N1832 Chronic kidney disease, stage 3b: Secondary | ICD-10-CM | POA: Diagnosis not present

## 2023-07-07 DIAGNOSIS — E559 Vitamin D deficiency, unspecified: Secondary | ICD-10-CM | POA: Diagnosis not present

## 2023-07-07 DIAGNOSIS — I959 Hypotension, unspecified: Secondary | ICD-10-CM | POA: Diagnosis not present

## 2023-07-07 DIAGNOSIS — I5023 Acute on chronic systolic (congestive) heart failure: Secondary | ICD-10-CM | POA: Diagnosis not present

## 2023-07-07 DIAGNOSIS — I4891 Unspecified atrial fibrillation: Secondary | ICD-10-CM | POA: Insufficient documentation

## 2023-07-07 DIAGNOSIS — I517 Cardiomegaly: Secondary | ICD-10-CM | POA: Diagnosis not present

## 2023-07-07 DIAGNOSIS — S299XXA Unspecified injury of thorax, initial encounter: Secondary | ICD-10-CM | POA: Diagnosis not present

## 2023-07-07 DIAGNOSIS — E440 Moderate protein-calorie malnutrition: Secondary | ICD-10-CM | POA: Diagnosis not present

## 2023-07-07 DIAGNOSIS — K8020 Calculus of gallbladder without cholecystitis without obstruction: Secondary | ICD-10-CM | POA: Diagnosis not present

## 2023-07-07 DIAGNOSIS — S61412A Laceration without foreign body of left hand, initial encounter: Secondary | ICD-10-CM | POA: Diagnosis not present

## 2023-07-07 DIAGNOSIS — R109 Unspecified abdominal pain: Secondary | ICD-10-CM | POA: Diagnosis not present

## 2023-07-07 DIAGNOSIS — I714 Abdominal aortic aneurysm, without rupture, unspecified: Secondary | ICD-10-CM | POA: Diagnosis not present

## 2023-07-07 DIAGNOSIS — S51012A Laceration without foreign body of left elbow, initial encounter: Secondary | ICD-10-CM | POA: Diagnosis not present

## 2023-07-07 DIAGNOSIS — K8689 Other specified diseases of pancreas: Secondary | ICD-10-CM | POA: Diagnosis not present

## 2023-07-07 DIAGNOSIS — S199XXA Unspecified injury of neck, initial encounter: Secondary | ICD-10-CM | POA: Diagnosis not present

## 2023-07-07 DIAGNOSIS — R1031 Right lower quadrant pain: Secondary | ICD-10-CM | POA: Diagnosis not present

## 2023-07-07 DIAGNOSIS — Z85818 Personal history of malignant neoplasm of other sites of lip, oral cavity, and pharynx: Secondary | ICD-10-CM | POA: Diagnosis not present

## 2023-07-07 DIAGNOSIS — I440 Atrioventricular block, first degree: Secondary | ICD-10-CM | POA: Diagnosis not present

## 2023-07-07 LAB — CBC
HCT: 38.7 % — ABNORMAL LOW (ref 39.0–52.0)
Hemoglobin: 11.8 g/dL — ABNORMAL LOW (ref 13.0–17.0)
MCH: 26.9 pg (ref 26.0–34.0)
MCHC: 30.5 g/dL (ref 30.0–36.0)
MCV: 88.2 fL (ref 80.0–100.0)
Platelets: 151 10*3/uL (ref 150–400)
RBC: 4.39 MIL/uL (ref 4.22–5.81)
RDW: 16.1 % — ABNORMAL HIGH (ref 11.5–15.5)
WBC: 13.3 10*3/uL — ABNORMAL HIGH (ref 4.0–10.5)
nRBC: 0 % (ref 0.0–0.2)

## 2023-07-07 LAB — URINALYSIS, ROUTINE W REFLEX MICROSCOPIC
Bilirubin Urine: NEGATIVE
Glucose, UA: 150 mg/dL — AB
Hgb urine dipstick: NEGATIVE
Ketones, ur: NEGATIVE mg/dL
Leukocytes,Ua: NEGATIVE
Nitrite: NEGATIVE
Protein, ur: NEGATIVE mg/dL
Specific Gravity, Urine: 1.009 (ref 1.005–1.030)
pH: 7 (ref 5.0–8.0)

## 2023-07-07 LAB — COMPREHENSIVE METABOLIC PANEL WITH GFR
ALT: 23 U/L (ref 0–44)
AST: 25 U/L (ref 15–41)
Albumin: 2.9 g/dL — ABNORMAL LOW (ref 3.5–5.0)
Alkaline Phosphatase: 85 U/L (ref 38–126)
Anion gap: 12 (ref 5–15)
BUN: 55 mg/dL — ABNORMAL HIGH (ref 8–23)
CO2: 35 mmol/L — ABNORMAL HIGH (ref 22–32)
Calcium: 8.4 mg/dL — ABNORMAL LOW (ref 8.9–10.3)
Chloride: 93 mmol/L — ABNORMAL LOW (ref 98–111)
Creatinine, Ser: 1.85 mg/dL — ABNORMAL HIGH (ref 0.61–1.24)
GFR, Estimated: 39 mL/min — ABNORMAL LOW (ref >60)
Glucose, Bld: 247 mg/dL — ABNORMAL HIGH (ref 70–99)
Potassium: 3.4 mmol/L — ABNORMAL LOW (ref 3.5–5.1)
Sodium: 140 mmol/L (ref 135–145)
Total Bilirubin: 1.5 mg/dL — ABNORMAL HIGH (ref 0.0–1.2)
Total Protein: 5.5 g/dL — ABNORMAL LOW (ref 6.5–8.1)

## 2023-07-07 LAB — I-STAT CHEM 8, ED
BUN: 52 mg/dL — ABNORMAL HIGH (ref 8–23)
Calcium, Ion: 1.04 mmol/L — ABNORMAL LOW (ref 1.15–1.40)
Chloride: 91 mmol/L — ABNORMAL LOW (ref 98–111)
Creatinine, Ser: 1.8 mg/dL — ABNORMAL HIGH (ref 0.61–1.24)
Glucose, Bld: 240 mg/dL — ABNORMAL HIGH (ref 70–99)
HCT: 38 % — ABNORMAL LOW (ref 39.0–52.0)
Hemoglobin: 12.9 g/dL — ABNORMAL LOW (ref 13.0–17.0)
Potassium: 3.4 mmol/L — ABNORMAL LOW (ref 3.5–5.1)
Sodium: 139 mmol/L (ref 135–145)
TCO2: 36 mmol/L — ABNORMAL HIGH (ref 22–32)

## 2023-07-07 LAB — PROTIME-INR
INR: 1.6 — ABNORMAL HIGH (ref 0.8–1.2)
Prothrombin Time: 19.1 s — ABNORMAL HIGH (ref 11.4–15.2)

## 2023-07-07 LAB — ETHANOL: Alcohol, Ethyl (B): <15 mg/dL (ref <15)

## 2023-07-07 LAB — TROPONIN I (HIGH SENSITIVITY)
Troponin I (High Sensitivity): 122 ng/L (ref <18)
Troponin I (High Sensitivity): 111 ng/L (ref <18)

## 2023-07-07 LAB — I-STAT CG4 LACTIC ACID, ED: Lactic Acid, Venous: 1.4 mmol/L (ref 0.5–1.9)

## 2023-07-07 MED ORDER — LORAZEPAM 2 MG/ML IJ SOLN
1.0000 mg | Freq: Once | INTRAMUSCULAR | Status: AC
  Administered 2023-07-07: 1 mg via INTRAVENOUS
  Filled 2023-07-07: qty 1

## 2023-07-07 MED ORDER — SODIUM CHLORIDE 0.9 % IV BOLUS
1000.0000 mL | Freq: Once | INTRAVENOUS | Status: AC
  Administered 2023-07-07: 1000 mL via INTRAVENOUS

## 2023-07-07 NOTE — ED Triage Notes (Signed)
 BIB EMS From nursing home. Pt saw pt on floor but did not see him fall. Pt denies pain. No deformities noted. Pt has skin tear to left hand and bilateral elbows laceration behind right ear. . Pt is on blood thinner. History of falls

## 2023-07-07 NOTE — Discharge Instructions (Addendum)
 Keep your appointment with your oncologist.   07/14/2023 10:00 AM EDT Office Visit [facility] ENT ONCOLOGY [address] [facility]  [address] [place], [place] [address]  [phone]  Vickie Grana, MD  [address]  [place], [place] [address]  [phone] (Work)  [phone] (Fax)

## 2023-07-07 NOTE — ED Provider Notes (Signed)
 [place] EMERGENCY DEPARTMENT AT [facility] Provider Note   CSN: [id number] Arrival date & time: 07/07/23  1831     History  Chief Complaint  Patient presents with   Michael [name] is a 68 y.o. male.  Pt is a 68 yo male with pmhx significant for head/neck cancer, anemia, htn, copd, cad, afib (on eliquis ), and cardiomyopathy.  Pt said he fell, but is not sure why he fell.  Pt said he did hit his head.  He does not remember what happened.  Staff heard him and saw him on the ground, but did not see why he fell.        Home Medications Prior to Admission medications   Medication Sig Start Date End Date Taking? Authorizing Provider  apixaban  (ELIQUIS ) 5 MG TABS tablet Take 1 tablet (5 mg total) by mouth 2 (two) times daily. 07/02/23   Colin Dawley, MD  atorvastatin  (LIPITOR ) 40 MG tablet Take 1 tablet (40 mg total) by mouth every evening. Patient taking differently: Take 40 mg by mouth daily. 08/01/22 02/05/24  Bobbetta Burnet, MD  budesonide -glycopyrrolate -formoterol  (BREZTRI  AEROSPHERE) 160-9-4.8 MCG/ACT AERO inhaler Inhale 2 puffs into the lungs 2 (two) times daily. 07/02/23   Colin Dawley, MD  carvedilol  (COREG ) 25 MG tablet Take 1 tablet (25 mg total) by mouth 2 (two) times daily with a meal. 07/02/23   Emokpae, Courage, MD  dicyclomine  (BENTYL ) 20 MG tablet Take 1 tablet (20 mg total) by mouth 2 (two) times daily. 05/31/23   Baxter Limber A, PA-C  doxycycline  (VIBRA -TABS) 100 MG tablet Take 1 tablet (100 mg total) by mouth 2 (two) times daily for 5 days. 07/02/23 07/07/23  Colin Dawley, MD  losartan  (COZAAR ) 25 MG tablet Take 1 tablet (25 mg total) by mouth daily. 07/02/23   Colin Dawley, MD  nicotine  (NICODERM CQ  - DOSED IN MG/24 HOURS) 21 mg/24hr patch Place 1 patch (21 mg total) onto the skin daily. 07/02/23   Colin Dawley, MD  nitroGLYCERIN  (NITROSTAT ) 0.4 MG SL tablet Place 1 tablet (0.4 mg total) under the tongue every 5 (five) minutes as  needed for chest pain. 02/05/23   Merdis Stalling, MD  potassium chloride  SA (KLOR-CON  M) 20 MEQ tablet Take 1 tablet (20 mEq total) by mouth 2 (two) times daily. Take Potassium Tablet while Taking Demadex /Torsemide  07/02/23 08/01/23  Colin Dawley, MD  predniSONE  (DELTASONE ) 20 MG tablet Take 1 tablet (20 mg total) by mouth daily with breakfast for 5 days. 07/02/23 07/07/23  Colin Dawley, MD  torsemide  (DEMADEX ) 20 MG tablet Take 1 tablet (20 mg total) by mouth See admin instructions. Take 2 Tabs (40 mg) q am and 1 tab (20 mg) QPM-- 07/02/23   Emokpae, Courage, MD  Vericiguat  (VERQUVO ) 5 MG TABS Take 1 tablet (5 mg total) by mouth daily. 07/02/23   Colin Dawley, MD      Allergies    Entresto  [sacubitril -valsartan ] and Sglt2 inhibitors    Review of Systems   Review of Systems  Skin:  Positive for wound.  All other systems reviewed and are negative.   Physical Exam Updated Vital Signs BP 128/76   Pulse 72   Temp 98.2 F (36.8 C)   Resp 20   SpO2 100%  Physical Exam Vitals and nursing note reviewed.  Constitutional:      Appearance: Normal appearance.  HENT:     Head: Normocephalic.     Comments: Contusion to right ear;  Hx right  facial basal cell carcinoma s/p resection with right partial mastectomy and partal rhinectomy with skin graft     Right Ear: External ear normal.     Left Ear: External ear normal.     Mouth/Throat:     Pharynx: Oropharynx is clear.  Cardiovascular:     Rate and Rhythm: Normal rate and regular rhythm.     Pulses: Normal pulses.     Heart sounds: Normal heart sounds.  Pulmonary:     Effort: Pulmonary effort is normal.     Breath sounds: Normal breath sounds.  Abdominal:     General: Abdomen is flat. Bowel sounds are normal.     Palpations: Abdomen is soft.  Musculoskeletal:        General: Normal range of motion.     Cervical back: Normal range of motion and neck supple.  Skin:    General: Skin is warm.     Capillary Refill: Capillary refill  takes less than 2 seconds.     Comments: Skin tear left hand and bilateral elbows  Neurological:     General: No focal deficit present.     Mental Status: He is alert and oriented to person, place, and time.  Psychiatric:        Mood and Affect: Mood normal.        Behavior: Behavior normal.     ED Results / Procedures / Treatments   Labs (all labs ordered are listed, but only abnormal results are displayed) Labs Reviewed  COMPREHENSIVE METABOLIC PANEL WITH GFR - Abnormal; Notable for the following components:      Result Value   Potassium 3.4 (*)    Chloride 93 (*)    CO2 35 (*)    Glucose, Bld 247 (*)    BUN 55 (*)    Creatinine, Ser 1.85 (*)    Calcium  8.4 (*)    Total Protein 5.5 (*)    Albumin  2.9 (*)    Total Bilirubin 1.5 (*)    GFR, Estimated 39 (*)    All other components within normal limits  CBC - Abnormal; Notable for the following components:   WBC 13.3 (*)    Hemoglobin 11.8 (*)    HCT 38.7 (*)    RDW 16.1 (*)    All other components within normal limits  URINALYSIS, ROUTINE W REFLEX MICROSCOPIC - Abnormal; Notable for the following components:   Color, Urine STRAW (*)    Glucose, UA 150 (*)    All other components within normal limits  PROTIME-INR - Abnormal; Notable for the following components:   Prothrombin Time 19.1 (*)    INR 1.6 (*)    All other components within normal limits  I-STAT CHEM 8, ED - Abnormal; Notable for the following components:   Potassium 3.4 (*)    Chloride 91 (*)    BUN 52 (*)    Creatinine, Ser 1.80 (*)    Glucose, Bld 240 (*)    Calcium , Ion 1.04 (*)    TCO2 36 (*)    Hemoglobin 12.9 (*)    HCT 38.0 (*)    All other components within normal limits  TROPONIN I (HIGH SENSITIVITY) - Abnormal; Notable for the following components:   Troponin I (High Sensitivity) 122 (*)    All other components within normal limits  TROPONIN I (HIGH SENSITIVITY) - Abnormal; Notable for the following components:   Troponin I (High  Sensitivity) 111 (*)    All other components within normal limits  ETHANOL  I-STAT CG4 LACTIC ACID, ED    EKG EKG Interpretation Date/Time:  Friday July 07 2023 20:41:58 EDT Ventricular Rate:  68 PR Interval:  246 QRS Duration:  133 QT Interval:  445 QTC Calculation: 474 R Axis:   171  Text Interpretation: Sinus or ectopic atrial rhythm Multiform ventricular premature complexes Prolonged PR interval Nonspecific intraventricular conduction delay Anterior infarct, old Borderline repolarization abnormality No significant change since last tracing Confirmed by Sueellen Emery [phone]) on 07/07/2023 8:51:35 PM  Radiology CT HEAD WO CONTRAST Result Date: 07/07/2023 CLINICAL DATA:  Head trauma EXAM: CT HEAD WITHOUT CONTRAST CT CERVICAL SPINE WITHOUT CONTRAST TECHNIQUE: Multidetector CT imaging of the head and cervical spine was performed following the standard protocol without intravenous contrast. Multiplanar CT image reconstructions of the cervical spine were also generated. RADIATION DOSE REDUCTION: This exam was performed according to the departmental dose-optimization program which includes automated exposure control, adjustment of the mA and/or kV according to patient size and/or use of iterative reconstruction technique. COMPARISON:  11/13/2022 FINDINGS: CT HEAD FINDINGS Brain: There is no mass, hemorrhage or extra-axial collection. There is generalized atrophy without lobar predilection. Hypodensity of the white matter is most commonly associated with chronic microvascular disease. Old small vessel infarct of the left basal ganglia. Vascular: No hyperdense vessel or unexpected vascular calcification. Skull: Heterogeneous lytic appearance of the left paramedian frontal calvarium extending to the superior nasal region. This has progressed compared to earlier studies. Fixation plate along the anterior maxillary surface. Sinuses/Orbits: No fluid levels or advanced mucosal thickening of the visualized  paranasal sinuses. No mastoid or middle ear effusion. Normal orbits. Other: None. CT CERVICAL SPINE FINDINGS Alignment: No static subluxation. Facets are aligned. Occipital condyles are normally positioned. Skull base and vertebrae: No acute fracture. Soft tissues and spinal canal: No prevertebral fluid or swelling. No visible canal hematoma. Disc levels: Small central disc protrusions at C3-4 and C4-5. Upper chest: No pneumothorax, pulmonary nodule or pleural effusion. Other: Normal visualized paraspinal cervical soft tissues. IMPRESSION: 1. No acute intracranial abnormality. 2. No acute fracture or static subluxation of the cervical spine. 3. Heterogeneous lytic appearance of the left paramedian frontal calvarium extending to the superior nasal region. This has progressed compared to earlier studies. This is concerning for a neoplastic process. MRI of the brain with and without contrast is recommended for further evaluation. Electronically Signed   By: Juanetta Nordmann M.D.   On: 07/07/2023 21:22   CT CERVICAL SPINE WO CONTRAST Result Date: 07/07/2023 CLINICAL DATA:  Head trauma EXAM: CT HEAD WITHOUT CONTRAST CT CERVICAL SPINE WITHOUT CONTRAST TECHNIQUE: Multidetector CT imaging of the head and cervical spine was performed following the standard protocol without intravenous contrast. Multiplanar CT image reconstructions of the cervical spine were also generated. RADIATION DOSE REDUCTION: This exam was performed according to the departmental dose-optimization program which includes automated exposure control, adjustment of the mA and/or kV according to patient size and/or use of iterative reconstruction technique. COMPARISON:  11/13/2022 FINDINGS: CT HEAD FINDINGS Brain: There is no mass, hemorrhage or extra-axial collection. There is generalized atrophy without lobar predilection. Hypodensity of the white matter is most commonly associated with chronic microvascular disease. Old small vessel infarct of the left  basal ganglia. Vascular: No hyperdense vessel or unexpected vascular calcification. Skull: Heterogeneous lytic appearance of the left paramedian frontal calvarium extending to the superior nasal region. This has progressed compared to earlier studies. Fixation plate along the anterior maxillary surface. Sinuses/Orbits: No fluid levels or advanced mucosal thickening of the  visualized paranasal sinuses. No mastoid or middle ear effusion. Normal orbits. Other: None. CT CERVICAL SPINE FINDINGS Alignment: No static subluxation. Facets are aligned. Occipital condyles are normally positioned. Skull base and vertebrae: No acute fracture. Soft tissues and spinal canal: No prevertebral fluid or swelling. No visible canal hematoma. Disc levels: Small central disc protrusions at C3-4 and C4-5. Upper chest: No pneumothorax, pulmonary nodule or pleural effusion. Other: Normal visualized paraspinal cervical soft tissues. IMPRESSION: 1. No acute intracranial abnormality. 2. No acute fracture or static subluxation of the cervical spine. 3. Heterogeneous lytic appearance of the left paramedian frontal calvarium extending to the superior nasal region. This has progressed compared to earlier studies. This is concerning for a neoplastic process. MRI of the brain with and without contrast is recommended for further evaluation. Electronically Signed   By: Juanetta Nordmann M.D.   On: 07/07/2023 21:22   DG Pelvis Portable Result Date: 07/07/2023 CLINICAL DATA:  Fall EXAM: PORTABLE PELVIS 1-2 VIEWS COMPARISON:  None Available. FINDINGS: No acute bony abnormality. Specifically, no fracture, subluxation, or dislocation. SI joints and hip joints symmetric. Vascular calcifications. IMPRESSION: No acute bony abnormality. Electronically Signed   By: Janeece Mechanic M.D.   On: 07/07/2023 18:46   DG Chest Port 1 View Result Date: 07/07/2023 CLINICAL DATA:  Trauma, fall EXAM: PORTABLE CHEST 1 VIEW COMPARISON:  07/04/2023 FINDINGS: Cardiomegaly. Aortic  atherosclerosis. No confluent opacities or effusions. No visible rib fracture or pneumothorax. IMPRESSION: Cardiomegaly.  No active disease. Electronically Signed   By: Janeece Mechanic M.D.   On: 07/07/2023 18:45    Procedures Procedures    Medications Ordered in ED Medications  sodium chloride  0.9 % bolus 1,000 mL (0 mLs Intravenous Stopped 07/07/23 2204)  LORazepam (ATIVAN) injection 1 mg (1 mg Intravenous Given 07/07/23 2202)    ED Course/ Medical Decision Making/ A&P                                 Medical Decision Making Amount and/or Complexity of Data Reviewed Labs: ordered. Radiology: ordered.  Risk Prescription drug management.   This patient presents to the ED for concern of fall, this involves an extensive number of treatment options, and is a complaint that carries with it a high risk of complications and morbidity.  The differential diagnosis includes multiple trauma, syncope   Co morbidities that complicate the patient evaluation  head/neck cancer, anemia, htn, copd, cad, afib (on eliquis ), and cardiomyopathy   Additional history obtained:  Additional history obtained from epic chart review External records from outside source obtained and reviewed including EMS report   Lab Tests:  I Ordered, and personally interpreted labs.  The pertinent results include:  cbc with hgb 11.8 (stable), cmp with bun 55 and cr 1.85 (stable); lactic nl, etoh neg; trop elevated at 122, but 2nd trop 111 (pt's troponins are usually elevated); ua neg   Imaging Studies ordered:  I ordered imaging studies including cxr, pelvis, ct head/ct cervical  I independently visualized and interpreted imaging which showed  CXR: Cardiomegaly.  No active disease.  Pelvis: No acute bony abnormality.  CT head/c-spine: No acute intracranial abnormality.  2. No acute fracture or static subluxation of the cervical spine.  3. Heterogeneous lytic appearance of the left paramedian frontal  calvarium  extending to the superior nasal region. This has  progressed compared to earlier studies. This is concerning for a  neoplastic process. MRI of the brain with  and without contrast is  recommended for further evaluation.   I agree with the radiologist interpretation   Cardiac Monitoring:  The patient was maintained on a cardiac monitor.  I personally viewed and interpreted the cardiac monitored which showed an underlying rhythm of: nsr   Medicines ordered and prescription drug management:  I ordered medication including ivfs  for sx  Reevaluation of the patient after these medicines showed that the patient improved I have reviewed the patients home medicines and have made adjustments as needed   Test Considered:  mri   Problem List / ED Course:  Fall:  likely mechanical.  Pt has no significant injury.  CT does show an abnormality from his skin cancer and MRI recommended.  Pt unable to tolerate.  He does have an appt with his oncologist next week.  At this point, I don't think it is emergent to get the MRI, so it can be done at an open MRI if his oncologist recommends this.  Pt is stable for d/c.  Return if worse.  Elevated trop:  flat.  No cp.   Reevaluation:  After the interventions noted above, I reevaluated the patient and found that they have :improved   Social Determinants of Health:  Lives in SNF   Dispostion:  After consideration of the diagnostic results and the patients response to treatment, I feel that the patent would benefit from discharge with outpatient f/u.          Final Clinical Impression(s) / ED Diagnoses Final diagnoses:  Skin tear of left hand without complication, initial encounter  Fall, initial encounter  On apixaban  therapy  Skin cancer    Rx / DC Orders ED Discharge Orders     None         Sueellen Emery, MD 07/07/23 2309

## 2023-07-07 NOTE — ED Notes (Signed)
 Gave report to transport, attempted to call Facility 4 times and no answer at this time.

## 2023-07-08 ENCOUNTER — Emergency Department (HOSPITAL_COMMUNITY)

## 2023-07-08 ENCOUNTER — Other Ambulatory Visit: Payer: Self-pay

## 2023-07-08 ENCOUNTER — Encounter (HOSPITAL_COMMUNITY): Payer: Self-pay | Admitting: Emergency Medicine

## 2023-07-08 ENCOUNTER — Emergency Department (HOSPITAL_COMMUNITY)
Admission: EM | Admit: 2023-07-08 | Discharge: 2023-07-09 | Disposition: A | Discharge status: Home / Self Care | Source: Home / Self Care | Attending: Emergency Medicine | Admitting: Emergency Medicine

## 2023-07-08 DIAGNOSIS — Z043 Encounter for examination and observation following other accident: Secondary | ICD-10-CM | POA: Diagnosis not present

## 2023-07-08 DIAGNOSIS — Z7901 Long term (current) use of anticoagulants: Secondary | ICD-10-CM | POA: Insufficient documentation

## 2023-07-08 DIAGNOSIS — S3993XA Unspecified injury of pelvis, initial encounter: Secondary | ICD-10-CM | POA: Diagnosis not present

## 2023-07-08 DIAGNOSIS — I714 Abdominal aortic aneurysm, without rupture, unspecified: Secondary | ICD-10-CM | POA: Diagnosis not present

## 2023-07-08 DIAGNOSIS — W1839XA Other fall on same level, initial encounter: Secondary | ICD-10-CM | POA: Insufficient documentation

## 2023-07-08 DIAGNOSIS — M160 Bilateral primary osteoarthritis of hip: Secondary | ICD-10-CM | POA: Diagnosis not present

## 2023-07-08 DIAGNOSIS — I517 Cardiomegaly: Secondary | ICD-10-CM | POA: Diagnosis not present

## 2023-07-08 DIAGNOSIS — R404 Transient alteration of awareness: Secondary | ICD-10-CM | POA: Diagnosis not present

## 2023-07-08 DIAGNOSIS — R109 Unspecified abdominal pain: Secondary | ICD-10-CM | POA: Insufficient documentation

## 2023-07-08 DIAGNOSIS — R531 Weakness: Secondary | ICD-10-CM | POA: Diagnosis not present

## 2023-07-08 DIAGNOSIS — R1031 Right lower quadrant pain: Secondary | ICD-10-CM | POA: Diagnosis not present

## 2023-07-08 DIAGNOSIS — K8689 Other specified diseases of pancreas: Secondary | ICD-10-CM | POA: Diagnosis not present

## 2023-07-08 DIAGNOSIS — S3991XA Unspecified injury of abdomen, initial encounter: Secondary | ICD-10-CM | POA: Diagnosis not present

## 2023-07-08 DIAGNOSIS — R739 Hyperglycemia, unspecified: Secondary | ICD-10-CM | POA: Diagnosis not present

## 2023-07-08 DIAGNOSIS — Z743 Need for continuous supervision: Secondary | ICD-10-CM | POA: Diagnosis not present

## 2023-07-08 DIAGNOSIS — R0689 Other abnormalities of breathing: Secondary | ICD-10-CM | POA: Diagnosis not present

## 2023-07-08 DIAGNOSIS — R6889 Other general symptoms and signs: Secondary | ICD-10-CM | POA: Diagnosis not present

## 2023-07-08 DIAGNOSIS — W19XXXA Unspecified fall, initial encounter: Secondary | ICD-10-CM

## 2023-07-08 DIAGNOSIS — Z7401 Bed confinement status: Secondary | ICD-10-CM | POA: Diagnosis not present

## 2023-07-08 DIAGNOSIS — S61412A Laceration without foreign body of left hand, initial encounter: Secondary | ICD-10-CM | POA: Diagnosis not present

## 2023-07-08 LAB — CBC WITH DIFFERENTIAL/PLATELET
Abs Immature Granulocytes: 0.14 10*3/uL — ABNORMAL HIGH (ref 0.00–0.07)
Basophils Absolute: 0 10*3/uL (ref 0.0–0.1)
Basophils Relative: 0 %
Eosinophils Absolute: 0 10*3/uL (ref 0.0–0.5)
Eosinophils Relative: 0 %
HCT: 36.7 % — ABNORMAL LOW (ref 39.0–52.0)
Hemoglobin: 11.1 g/dL — ABNORMAL LOW (ref 13.0–17.0)
Immature Granulocytes: 1 %
Lymphocytes Relative: 4 %
Lymphs Abs: 0.7 10*3/uL (ref 0.7–4.0)
MCH: 26.8 pg (ref 26.0–34.0)
MCHC: 30.2 g/dL (ref 30.0–36.0)
MCV: 88.6 fL (ref 80.0–100.0)
Monocytes Absolute: 0.9 10*3/uL (ref 0.1–1.0)
Monocytes Relative: 5 %
Neutro Abs: 15.3 10*3/uL — ABNORMAL HIGH (ref 1.7–7.7)
Neutrophils Relative %: 90 %
Platelets: 142 10*3/uL — ABNORMAL LOW (ref 150–400)
RBC: 4.14 MIL/uL — ABNORMAL LOW (ref 4.22–5.81)
RDW: 16.5 % — ABNORMAL HIGH (ref 11.5–15.5)
WBC: 17 10*3/uL — ABNORMAL HIGH (ref 4.0–10.5)
nRBC: 0 % (ref 0.0–0.2)

## 2023-07-08 LAB — BASIC METABOLIC PANEL WITH GFR
Anion gap: 8 (ref 5–15)
BUN: 57 mg/dL — ABNORMAL HIGH (ref 8–23)
CO2: 34 mmol/L — ABNORMAL HIGH (ref 22–32)
Calcium: 8.1 mg/dL — ABNORMAL LOW (ref 8.9–10.3)
Chloride: 98 mmol/L (ref 98–111)
Creatinine, Ser: 1.45 mg/dL — ABNORMAL HIGH (ref 0.61–1.24)
GFR, Estimated: 52 mL/min — ABNORMAL LOW (ref >60)
Glucose, Bld: 268 mg/dL — ABNORMAL HIGH (ref 70–99)
Potassium: 3.4 mmol/L — ABNORMAL LOW (ref 3.5–5.1)
Sodium: 140 mmol/L (ref 135–145)

## 2023-07-08 MED ORDER — IOHEXOL 300 MG/ML  SOLN
80.0000 mL | Freq: Once | INTRAMUSCULAR | Status: AC | PRN
  Administered 2023-07-08: 80 mL via INTRAVENOUS

## 2023-07-08 NOTE — ED Notes (Signed)
 Pt would like [place] sandwich, crackers and a soda when allowed.

## 2023-07-08 NOTE — ED Provider Notes (Signed)
  EMERGENCY DEPARTMENT AT [facility] Provider Note   CSN: [id number] Arrival date & time: 07/08/23  1710     History  Chief Complaint  Patient presents with   Michael [name] is a 68 y.o. male.  HPI   68 year old male presents the emergency department with mechanical fall.  Patient was at his facility.  He states he was trying to stand up from the bed using his walker when the walker scooted out in front of him and he went down onto his left side.  Did not hit his head, no loss of consciousness.  Main complaint is left side/abdominal pain.  Unclear if he specifically hit this on something.  He denies any headache, neck pain, upper back pain, shortness of breath, hip or extremity pain.  Is anticoagulated on Eliquis .  Home Medications Prior to Admission medications   Medication Sig Start Date End Date Taking? Authorizing Provider  apixaban  (ELIQUIS ) 5 MG TABS tablet Take 1 tablet (5 mg total) by mouth 2 (two) times daily. 07/02/23   Colin Dawley, MD  atorvastatin  (LIPITOR ) 40 MG tablet Take 1 tablet (40 mg total) by mouth every evening. Patient taking differently: Take 40 mg by mouth daily. 08/01/22 02/05/24  Bobbetta Burnet, MD  budesonide -glycopyrrolate -formoterol  (BREZTRI  AEROSPHERE) 160-9-4.8 MCG/ACT AERO inhaler Inhale 2 puffs into the lungs 2 (two) times daily. 07/02/23   Colin Dawley, MD  carvedilol  (COREG ) 25 MG tablet Take 1 tablet (25 mg total) by mouth 2 (two) times daily with a meal. 07/02/23   Emokpae, Courage, MD  dicyclomine  (BENTYL ) 20 MG tablet Take 1 tablet (20 mg total) by mouth 2 (two) times daily. 05/31/23   Baxter Limber A, PA-C  losartan  (COZAAR ) 25 MG tablet Take 1 tablet (25 mg total) by mouth daily. 07/02/23   Colin Dawley, MD  nicotine  (NICODERM CQ  - DOSED IN MG/24 HOURS) 21 mg/24hr patch Place 1 patch (21 mg total) onto the skin daily. 07/02/23   Colin Dawley, MD  nitroGLYCERIN  (NITROSTAT ) 0.4 MG SL tablet Place 1 tablet  (0.4 mg total) under the tongue every 5 (five) minutes as needed for chest pain. 02/05/23   Merdis Stalling, MD  potassium chloride  SA (KLOR-CON  M) 20 MEQ tablet Take 1 tablet (20 mEq total) by mouth 2 (two) times daily. Take Potassium Tablet while Taking Demadex /Torsemide  07/02/23 08/01/23  Colin Dawley, MD  torsemide  (DEMADEX ) 20 MG tablet Take 1 tablet (20 mg total) by mouth See admin instructions. Take 2 Tabs (40 mg) q am and 1 tab (20 mg) QPM-- 07/02/23   Emokpae, Courage, MD  Vericiguat  (VERQUVO ) 5 MG TABS Take 1 tablet (5 mg total) by mouth daily. 07/02/23   Colin Dawley, MD      Allergies    Entresto  [sacubitril -valsartan ] and Sglt2 inhibitors    Review of Systems   Review of Systems  Constitutional:  Negative for fever.  Respiratory:  Negative for shortness of breath.   Cardiovascular:  Negative for chest pain.  Gastrointestinal:  Negative for abdominal pain, diarrhea and vomiting.  Musculoskeletal:  Negative for back pain, neck pain and neck stiffness.       + Left side/flank/abdominal pain  Skin:  Negative for rash.  Neurological:  Negative for syncope and headaches.    Physical Exam Updated Vital Signs BP (!) 147/96   Pulse (!) 36   Temp 97.7 F (36.5 C) (Oral)   Resp 19   Ht 6' (1.829 m)   Wt 64  kg   SpO2 100%   BMI 19.12 kg/m  Physical Exam Vitals and nursing note reviewed.  Constitutional:      General: He is not in acute distress.    Appearance: Normal appearance.  HENT:     Head: Normocephalic.     Nose:     Comments: Chronic deformity/healing scabbed    Mouth/Throat:     Mouth: Mucous membranes are moist.  Cardiovascular:     Rate and Rhythm: Normal rate.  Pulmonary:     Effort: Pulmonary effort is normal. No respiratory distress.  Abdominal:     Palpations: Abdomen is soft.     Comments: Tenderness to palpation of the left mid abdomen and flank with no overlying bruising/hematoma  Musculoskeletal:        General: No swelling, tenderness or  deformity.     Comments: Pelvis stable and nontender  Skin:    General: Skin is warm.  Neurological:     Mental Status: He is alert and oriented to person, place, and time. Mental status is at baseline.  Psychiatric:        Mood and Affect: Mood normal.     ED Results / Procedures / Treatments   Labs (all labs ordered are listed, but only abnormal results are displayed) Labs Reviewed  CBC WITH DIFFERENTIAL/PLATELET  BASIC METABOLIC PANEL WITH GFR    EKG None  Radiology CT HEAD WO CONTRAST Result Date: 07/07/2023 CLINICAL DATA:  Head trauma EXAM: CT HEAD WITHOUT CONTRAST CT CERVICAL SPINE WITHOUT CONTRAST TECHNIQUE: Multidetector CT imaging of the head and cervical spine was performed following the standard protocol without intravenous contrast. Multiplanar CT image reconstructions of the cervical spine were also generated. RADIATION DOSE REDUCTION: This exam was performed according to the departmental dose-optimization program which includes automated exposure control, adjustment of the mA and/or kV according to patient size and/or use of iterative reconstruction technique. COMPARISON:  11/13/2022 FINDINGS: CT HEAD FINDINGS Brain: There is no mass, hemorrhage or extra-axial collection. There is generalized atrophy without lobar predilection. Hypodensity of the white matter is most commonly associated with chronic microvascular disease. Old small vessel infarct of the left basal ganglia. Vascular: No hyperdense vessel or unexpected vascular calcification. Skull: Heterogeneous lytic appearance of the left paramedian frontal calvarium extending to the superior nasal region. This has progressed compared to earlier studies. Fixation plate along the anterior maxillary surface. Sinuses/Orbits: No fluid levels or advanced mucosal thickening of the visualized paranasal sinuses. No mastoid or middle ear effusion. Normal orbits. Other: None. CT CERVICAL SPINE FINDINGS Alignment: No static subluxation.  Facets are aligned. Occipital condyles are normally positioned. Skull base and vertebrae: No acute fracture. Soft tissues and spinal canal: No prevertebral fluid or swelling. No visible canal hematoma. Disc levels: Small central disc protrusions at C3-4 and C4-5. Upper chest: No pneumothorax, pulmonary nodule or pleural effusion. Other: Normal visualized paraspinal cervical soft tissues. IMPRESSION: 1. No acute intracranial abnormality. 2. No acute fracture or static subluxation of the cervical spine. 3. Heterogeneous lytic appearance of the left paramedian frontal calvarium extending to the superior nasal region. This has progressed compared to earlier studies. This is concerning for a neoplastic process. MRI of the brain with and without contrast is recommended for further evaluation. Electronically Signed   By: Juanetta Nordmann M.D.   On: 07/07/2023 21:22   CT CERVICAL SPINE WO CONTRAST Result Date: 07/07/2023 CLINICAL DATA:  Head trauma EXAM: CT HEAD WITHOUT CONTRAST CT CERVICAL SPINE WITHOUT CONTRAST TECHNIQUE: Multidetector CT imaging of  the head and cervical spine was performed following the standard protocol without intravenous contrast. Multiplanar CT image reconstructions of the cervical spine were also generated. RADIATION DOSE REDUCTION: This exam was performed according to the departmental dose-optimization program which includes automated exposure control, adjustment of the mA and/or kV according to patient size and/or use of iterative reconstruction technique. COMPARISON:  11/13/2022 FINDINGS: CT HEAD FINDINGS Brain: There is no mass, hemorrhage or extra-axial collection. There is generalized atrophy without lobar predilection. Hypodensity of the white matter is most commonly associated with chronic microvascular disease. Old small vessel infarct of the left basal ganglia. Vascular: No hyperdense vessel or unexpected vascular calcification. Skull: Heterogeneous lytic appearance of the left paramedian  frontal calvarium extending to the superior nasal region. This has progressed compared to earlier studies. Fixation plate along the anterior maxillary surface. Sinuses/Orbits: No fluid levels or advanced mucosal thickening of the visualized paranasal sinuses. No mastoid or middle ear effusion. Normal orbits. Other: None. CT CERVICAL SPINE FINDINGS Alignment: No static subluxation. Facets are aligned. Occipital condyles are normally positioned. Skull base and vertebrae: No acute fracture. Soft tissues and spinal canal: No prevertebral fluid or swelling. No visible canal hematoma. Disc levels: Small central disc protrusions at C3-4 and C4-5. Upper chest: No pneumothorax, pulmonary nodule or pleural effusion. Other: Normal visualized paraspinal cervical soft tissues. IMPRESSION: 1. No acute intracranial abnormality. 2. No acute fracture or static subluxation of the cervical spine. 3. Heterogeneous lytic appearance of the left paramedian frontal calvarium extending to the superior nasal region. This has progressed compared to earlier studies. This is concerning for a neoplastic process. MRI of the brain with and without contrast is recommended for further evaluation. Electronically Signed   By: Juanetta Nordmann M.D.   On: 07/07/2023 21:22   DG Pelvis Portable Result Date: 07/07/2023 CLINICAL DATA:  Fall EXAM: PORTABLE PELVIS 1-2 VIEWS COMPARISON:  None Available. FINDINGS: No acute bony abnormality. Specifically, no fracture, subluxation, or dislocation. SI joints and hip joints symmetric. Vascular calcifications. IMPRESSION: No acute bony abnormality. Electronically Signed   By: Janeece Mechanic M.D.   On: 07/07/2023 18:46   DG Chest Port 1 View Result Date: 07/07/2023 CLINICAL DATA:  Trauma, fall EXAM: PORTABLE CHEST 1 VIEW COMPARISON:  07/04/2023 FINDINGS: Cardiomegaly. Aortic atherosclerosis. No confluent opacities or effusions. No visible rib fracture or pneumothorax. IMPRESSION: Cardiomegaly.  No active disease.  Electronically Signed   By: Janeece Mechanic M.D.   On: 07/07/2023 18:45    Procedures Procedures    Medications Ordered in ED Medications - No data to display  ED Course/ Medical Decision Making/ A&P                                 Medical Decision Making Amount and/or Complexity of Data Reviewed Labs: ordered. Radiology: ordered.  Risk Prescription drug management.   68 year old male presents to the emergency department with mechanical fall, slid out of his bed after his walker rolled away.  Went down onto the ground onto his bottom.  Did not hit his head, no loss of consciousness.  No headache or neck pain.  Anticoagulant Eliquis .  Vitals are normal and stable.  He has chronic findings on his physical exam except for left-sided abdominal tenderness that is reproducible on exam, wrapping around to his flank.  No overlying skin changes/bruising.  Patient believes that he fell down onto that side.  No other acute traumatic findings on evaluation.  Blood work is baseline, hemoglobin is stable, kidney function is baseline.  X-ray imaging shows no fracture.  CT of the abdomen pelvis given the abdominal pain on anticoagulation shows no acute finding on the left side of the abdomen.  There is noted a hematoma in the rectus muscle on the right that is chronic appearing.  Again hemoglobin is stable, patient did not hit this area when he fell, most likely chronic.  Patient at this time appears safe and stable for discharge and close outpatient follow up. Discharge plan and strict return to ED precautions discussed, patient verbalizes understanding and agreement.        Final Clinical Impression(s) / ED Diagnoses Final diagnoses:  None    Rx / DC Orders ED Discharge Orders     None         Flonnie Humphrey, DO 07/08/23 2216

## 2023-07-08 NOTE — ED Triage Notes (Addendum)
 Pt bib EMS from [facility] and rehab for a fall. Pt seen yesterday at [facility] for fall and returned to facility this morning. Pt was getting out of bed and says walker slid out from under him and he slid to the floor. Per pt no injuries are from this fall. Pt on Eliquis . CBG [id number]

## 2023-07-08 NOTE — Discharge Instructions (Addendum)
 You have been seen and discharged from the emergency department.  Your CT and x-ray imaging did not show any acute traumatic finding.  There is bruising along the right abdomen that looks chronic.  Your blood work is normal for you.  Follow-up with your primary provider for further evaluation and further care. Take home medications as prescribed. If you have any worsening symptoms or further concerns for your health please return to an emergency department for further evaluation.

## 2023-07-09 DIAGNOSIS — R6889 Other general symptoms and signs: Secondary | ICD-10-CM | POA: Diagnosis not present

## 2023-07-09 DIAGNOSIS — Z743 Need for continuous supervision: Secondary | ICD-10-CM | POA: Diagnosis not present

## 2023-07-09 DIAGNOSIS — W19XXXA Unspecified fall, initial encounter: Secondary | ICD-10-CM | POA: Diagnosis not present

## 2023-07-09 DIAGNOSIS — Z7401 Bed confinement status: Secondary | ICD-10-CM | POA: Diagnosis not present

## 2023-07-10 DIAGNOSIS — N1832 Chronic kidney disease, stage 3b: Secondary | ICD-10-CM | POA: Diagnosis not present

## 2023-07-10 DIAGNOSIS — J441 Chronic obstructive pulmonary disease with (acute) exacerbation: Secondary | ICD-10-CM | POA: Diagnosis not present

## 2023-07-10 DIAGNOSIS — I130 Hypertensive heart and chronic kidney disease with heart failure and stage 1 through stage 4 chronic kidney disease, or unspecified chronic kidney disease: Secondary | ICD-10-CM | POA: Diagnosis not present

## 2023-07-10 DIAGNOSIS — I160 Hypertensive urgency: Secondary | ICD-10-CM | POA: Diagnosis not present

## 2023-07-10 DIAGNOSIS — Z9181 History of falling: Secondary | ICD-10-CM | POA: Diagnosis not present

## 2023-07-10 DIAGNOSIS — Z85818 Personal history of malignant neoplasm of other sites of lip, oral cavity, and pharynx: Secondary | ICD-10-CM | POA: Diagnosis not present

## 2023-07-10 DIAGNOSIS — I5023 Acute on chronic systolic (congestive) heart failure: Secondary | ICD-10-CM | POA: Diagnosis not present

## 2023-07-10 DIAGNOSIS — I440 Atrioventricular block, first degree: Secondary | ICD-10-CM | POA: Diagnosis not present

## 2023-07-10 DIAGNOSIS — I4891 Unspecified atrial fibrillation: Secondary | ICD-10-CM | POA: Diagnosis not present

## 2023-07-10 DIAGNOSIS — K8020 Calculus of gallbladder without cholecystitis without obstruction: Secondary | ICD-10-CM | POA: Diagnosis not present

## 2023-07-10 DIAGNOSIS — E559 Vitamin D deficiency, unspecified: Secondary | ICD-10-CM | POA: Diagnosis not present

## 2023-07-10 DIAGNOSIS — E440 Moderate protein-calorie malnutrition: Secondary | ICD-10-CM | POA: Diagnosis not present

## 2023-07-11 DIAGNOSIS — E440 Moderate protein-calorie malnutrition: Secondary | ICD-10-CM | POA: Diagnosis not present

## 2023-07-11 DIAGNOSIS — I4891 Unspecified atrial fibrillation: Secondary | ICD-10-CM | POA: Diagnosis not present

## 2023-07-11 DIAGNOSIS — I130 Hypertensive heart and chronic kidney disease with heart failure and stage 1 through stage 4 chronic kidney disease, or unspecified chronic kidney disease: Secondary | ICD-10-CM | POA: Diagnosis not present

## 2023-07-11 DIAGNOSIS — J441 Chronic obstructive pulmonary disease with (acute) exacerbation: Secondary | ICD-10-CM | POA: Diagnosis not present

## 2023-07-11 DIAGNOSIS — Z9181 History of falling: Secondary | ICD-10-CM | POA: Diagnosis not present

## 2023-07-11 DIAGNOSIS — I160 Hypertensive urgency: Secondary | ICD-10-CM | POA: Diagnosis not present

## 2023-07-11 DIAGNOSIS — I440 Atrioventricular block, first degree: Secondary | ICD-10-CM | POA: Diagnosis not present

## 2023-07-11 DIAGNOSIS — I5023 Acute on chronic systolic (congestive) heart failure: Secondary | ICD-10-CM | POA: Diagnosis not present

## 2023-07-11 DIAGNOSIS — Z85818 Personal history of malignant neoplasm of other sites of lip, oral cavity, and pharynx: Secondary | ICD-10-CM | POA: Diagnosis not present

## 2023-07-11 DIAGNOSIS — E559 Vitamin D deficiency, unspecified: Secondary | ICD-10-CM | POA: Diagnosis not present

## 2023-07-11 DIAGNOSIS — N1832 Chronic kidney disease, stage 3b: Secondary | ICD-10-CM | POA: Diagnosis not present

## 2023-07-11 DIAGNOSIS — K8020 Calculus of gallbladder without cholecystitis without obstruction: Secondary | ICD-10-CM | POA: Diagnosis not present

## 2023-07-11 NOTE — Progress Notes (Unsigned)
 [facility] [address], [place] [address]   CLINIC:  Medical Oncology/Hematology  PCP:  [name], MD [address] [place] [address] [phone]   REASON FOR VISIT:  Follow-up for iron  deficiency anemia  CURRENT THERAPY: Intermittent IV iron   INTERVAL HISTORY:   [name] 68 y.o. male returns for routine follow-up of iron  deficiency anemia.  He was last seen by Sheril Dines PA-C on 03/08/2023.  He received Venofer  400 mg x 1 on 03/13/2023.  Since his last visit, he has been hospitalized four times: 03/31/2023 through 04/06/2023: Acute on chronic congestive heart failure with fluid overload 04/17/2023 through 04/21/2023: Acute on chronic combined systolic and diastolic CHF 05/10/2023 through 2/44/0102: Acute on chronic congestive heart failure with fluid overload 06/28/2023 through 07/02/2023: Acute on chronic CHF exacerbation + COPD exacerbation  At today's visit, he reports feeling ***.   ***He has significant fatigue, which was *** after most recent IV iron  in February 2025. ***Bowel movements are generally dark, and he denies any obvious bleeding such as bright blood per rectum or gross melena.   ***He has positional vertigo as well as lightheadedness and intermittent syncopal episodes (following closely with cardiology).   ***No pica, restless legs, headaches, chest pain.  He has chronic dyspnea on exertion.    He has 25***% energy and 50***% appetite. He endorses that he is maintaining a stable weight.  ASSESSMENT & PLAN:  1.    Iron  deficiency anemia + anemia of CKD stage IIIb - Combination anemia from CKD, malabsorption and blood loss. - Hemoccult stool was POSITIVE x3 (June 2022) - Most recent colonoscopy (07/19/2021) with villous, fungating, infiltrative and polypoid nonobstructing mass in ascending colon (addressed below); no bleeding was present - Right hemicolectomy on 08/11/2021, pathology showed high-grade dysplasia negative for  invasive carcinoma -- He has never had an EGD - Patient is hesitant to try oral iron  due to abdominal side effects - He has been on intermittent IV iron  infusions.  (Most recent IV Venofer  300 mg x 3 in November 2024) - Most recent labs (***): Hgb ***/MCV ***.  Ferritin ***, iron  saturation ***%. Patient also has CKD stage IIIb ***with creatinine more or less at baseline (***/GFR ***). - Intermittent dark stool and occasional red streaks*** (stool cards sent home, not completed) -*** Ongoing iron  deficiency despite IV repletion has been suspicious for ongoing occult blood loss, despite colon mass having been removed (right hemicolectomy on 08/11/2021) - PLAN: *** TBD.  *** Recommend additional IV Venofer  400 mg x 1.   - Repeat labs and RTC in 4 months. - Continue GI follow-up and consider EGD  - Patient is aware of alarm signs/symptoms that would prompt immediate medical attention   2.  Locally advanced basal cell carcinoma right nasolabial fold: - Biopsy of the right nasal mucosa on 04/05/2017 with basal cell carcinoma. - Biopsy of the left cheek lesion on 04/02/2017 consistent with basal cell carcinoma. - Resection with skin graft on 06/06/2017.  Margin was close.  Rest of the margins were negative. - 10 rounds of radiation by Dr. Lurena Sally for recurrent basal cell carcinoma to the right medial canthus completed on 03/20/2020.  - PLAN: Continue dermatology follow-up   3.  Right colon mass (tubular adenomas with high-grade dysplasia) - PET scan in 2019 (obtained due to basal cell skin cancer) demonstrated area of concern with mass in ascending colon - Colonoscopy in 2019 by Dr. Riley Cheadle demonstrated multiple polyps with tubular adenoma,  and patient was referred for surgical excision of colon due to volume of polyps and concern for more extensive disease.  Patient was lost to follow-up with GI and did not follow-up with surgeon at this time. - Reestablished care with GI in May 2023, with colonoscopy he  underwent colonoscopy  (07/19/2021) showing villous, fungating, infiltrative and polypoid nonobstructing mass in ascending colon - CT abdomen/pelvis (07/05/2021): Lobular intraluminal mass at: Of hepatic flexure measuring up to 8 cm in length; new pancreatic ductal dilation and atrophy favored to be sequela of chronic pancreatitis but MRI recommended - MRI abdomen (08/06/2021) showed upstream pancreatic atrophy and duct dilation secondary to dominant ductal calculi; no convincing evidence of underlying neoplasm - Due to large size of residual tubular adenoma/right colon mass, patient underwent right hemicolectomy on 08/11/2021.  Surgical pathology showed multiple tubular adenomas, several of which showed focal high-grade dysplasia, but were negative for invasive carcinoma.   - PLAN: Continue follow-up with GI.  (Patient was no-show and lost to follow-up with GI in May 2024.  Patient has been provided information to contact [facility], and message has been sent to GI APP)   4.  Vitamin D  deficiency - Prior vitamin D  (07/31/2021) low at 18.95 - Most recent vitamin D  (01/05/2022): Improved and normal at 35.06 - Patient does not think he is taking his vitamin D  50,000 units weekly anymore - Vitamin D  today (***) normal at *** - PLAN: *** TBD.  *** Recheck vitamin D  at follow-up  PLAN SUMMARY: *** >> Venofer  400 mg x 1 >> Labs in 4 months = CBC/D, CMP, ferritin, iron /TIBC, vitamin D  >> OFFICE visit in 4 months (1 week after labs)  >> Chart Cc'd to GI provider to re-establish care    REVIEW OF SYSTEMS:***  Review of Systems  Constitutional:  Positive for fatigue. Negative for appetite change, chills, diaphoresis, fever and unexpected weight change.  HENT:   Negative for lump/mass and nosebleeds.   Eyes:  Negative for eye problems.  Respiratory:  Positive for cough and shortness of breath (with exertion). Negative for hemoptysis.   Cardiovascular:  Positive for palpitations. Negative for chest pain and leg  swelling.  Gastrointestinal:  Negative for abdominal pain, blood in stool, constipation, diarrhea, nausea and vomiting.  Genitourinary:  Negative for hematuria.   Skin: Negative.   Neurological:  Positive for dizziness. Negative for headaches and light-headedness.  Hematological:  Does not bruise/bleed easily.  Psychiatric/Behavioral:  Positive for sleep disturbance.      PHYSICAL EXAM:***  ECOG PERFORMANCE STATUS: 1 - Symptomatic but completely ambulatory  There were no vitals filed for this visit.   There were no vitals filed for this visit.   Physical Exam Constitutional:      Appearance: Normal appearance. He is normal weight.  Cardiovascular:     Heart sounds: Normal heart sounds.  Pulmonary:     Breath sounds: Normal breath sounds.  Neurological:     General: No focal deficit present.     Mental Status: Mental status is at baseline.  Psychiatric:        Behavior: Behavior normal. Behavior is cooperative.     PAST MEDICAL/SURGICAL HISTORY:  Past Medical History:  Diagnosis Date   Aortic atherosclerosis (HCC) 08/03/2021   Atrial fibrillation (HCC)    CAD (coronary artery disease) 08/03/2021   Cardiac catheterization September 2023 with RCA/RV marginal stenosis managed medically   Cardiomyopathy [facility]    Carotid artery disease (HCC)    CKD (chronic kidney disease) 06/28/2023  CKD (chronic kidney disease) stage 3, GFR 30-59 ml/min (HCC)    COPD (chronic obstructive pulmonary disease) (HCC)    Essential hypertension    Head and neck cancer (HCC) 2019   Right facial basal cell carcinoma s/p resection with right partial mastectomy and partal rhinectomy with skin graft (06/13/17)   Iron  deficiency anemia    Mass of colon 08/06/2021   Formatting of this note might be different from the original. Last Assessment & Plan:  Formatting of this note might be different from the original.  Status post right hemicolectomy by Dr. Collene Dawson today 7/12.    -Will check postop CBC and  BMP     Urinary retention    Past Surgical History:  Procedure Laterality Date   ANKLE CLOSED REDUCTION Right    open reduction   BASAL CELL CARCINOMA EXCISION  2019   at [facility]   BIOPSY  07/19/2021   Procedure: BIOPSY;  Surgeon: Vinetta Greening, DO;  [facility];  Service: Endoscopy;;   CATARACT EXTRACTION W/PHACO Left 01/23/2023   Procedure: CATARACT EXTRACTION PHACO AND INTRAOCULAR LENS PLACEMENT (IOC);  Surgeon: Tarri Farm, MD;  Location: [facility];  Service: Ophthalmology;  Laterality: Left;  CDE: 23.26   COLONOSCOPY N/A 05/31/2017   Procedure: COLONOSCOPY;  Surgeon: Suzette Espy, MD;  Location: [facility];  Service: Endoscopy;  Laterality: N/A;  2:45pm   COLONOSCOPY WITH PROPOFOL  N/A 07/19/2021   Procedure: COLONOSCOPY WITH PROPOFOL ;  Surgeon: Vinetta Greening, DO;  Location: [facility];  Service: Endoscopy;  Laterality: N/A;  3:00pm, moved up to 9:00   CYSTOSCOPY N/A 10/29/2018   Procedure: CYSTOSCOPY, CLOT EVACUATION;  Surgeon: Adelbert Homans, MD;  Location: [facility];  Service: Urology;  Laterality: N/A;   PARTIAL COLECTOMY Right 08/11/2021   Procedure: PARTIAL COLECTOMY, OPEN RIGHT HEMICOLECTOMY;  Surgeon: Awilda Bogus, MD;  Location: [facility];  Service: General;  Laterality: Right;   POLYPECTOMY  05/31/2017   Procedure: POLYPECTOMY;  Surgeon: Suzette Espy, MD;  [facility]: [facility];  Service: Endoscopy;;   RIGHT/LEFT HEART CATH AND CORONARY ANGIOGRAPHY N/A 10/22/2021   Procedure: RIGHT/LEFT HEART CATH AND CORONARY ANGIOGRAPHY;  Surgeon: Kyra Phy, MD;  Location: [facility];  Service: Cardiovascular;  Laterality: N/A;   TONSILLECTOMY     TRANSCAROTID ARTERY REVASCULARIZATION  Right 02/15/2021   Procedure: RIGHT TRANSCAROTID ARTERY REVASCULARIZATION;  Surgeon: Young Hensen, MD;  Location: [facility] OR;  Service: Vascular;  Laterality: Right;   ULTRASOUND GUIDANCE FOR VASCULAR ACCESS Left 02/15/2021   Procedure: ULTRASOUND GUIDANCE  FOR VASCULAR ACCESS;  Surgeon: Young Hensen, MD;  Location: [facility] OR;  Service: Vascular;  Laterality: Left;   XI ROBOTIC ASSISTED SIMPLE PROSTATECTOMY N/A 10/29/2018   Procedure: XI ROBOTIC ASSISTED SIMPLE PROSTATECTOMY;  Surgeon: Marco Severs, MD;  Location: [facility];  Service: Urology;  Laterality: N/A;    SOCIAL HISTORY:  Social History   Socioeconomic History   Marital status: Single    Spouse name: Not on file   Number of children: Not on file   Years of education: 12   Highest education level: 12th grade  Occupational History   Not on file  Tobacco Use   Smoking status: Former    Current packs/day: 0.00    Average packs/day: 0.3 packs/day for 40.0 years (10.0 ttl pk-yrs)    Types: Cigarettes    Start date: 07/02/1981    Quit date: 07/02/2021    Years since quitting: 2.0  Passive exposure: Past   Smokeless tobacco: Never   Tobacco comments:    Quit on 06/09/21  Vaping Use   Vaping status: Never Used  Substance and Sexual Activity   Alcohol use: Not Currently   Drug use: Never   Sexual activity: Not Currently  Other Topics Concern   Not on file  Social History Narrative   Not on file   Social Drivers of Health   Financial Resource Strain: Low Risk  (12/26/2022)   Overall Financial Resource Strain (CARDIA)    Difficulty of Paying Living Expenses: Not hard at all  Food Insecurity: No Food Insecurity (07/04/2023)   Hunger Vital Sign    Worried About Running Out of Food in the Last Year: Never true    Ran Out of Food in the Last Year: Never true  Transportation Needs: No Transportation Needs (07/04/2023)   PRAPARE - Administrator, Civil Service (Medical): No    Lack of Transportation (Non-Medical): No  Physical Activity: Sufficiently Active (12/26/2022)   Exercise Vital Sign    Days of Exercise per Week: 5 days    Minutes of Exercise per Session: 50 min  Stress: No Stress Concern Present (12/26/2022)   [employer] of Occupational Health -  Occupational Stress Questionnaire    Feeling of Stress : Only a little  Social Connections: Moderately Isolated (06/28/2023)   Social Connection and Isolation Panel [NHANES]    Frequency of Communication with Friends and Family: More than three times a week    Frequency of Social Gatherings with Friends and Family: More than three times a week    Attends Religious Services: Never    Database administrator or Organizations: Yes    Attends Engineer, structural: More than 4 times per year    Marital Status: Divorced  Intimate Partner Violence: Not At Risk (07/04/2023)   Humiliation, Afraid, Rape, and Kick questionnaire    Fear of Current or Ex-Partner: No    Emotionally Abused: No    Physically Abused: No    Sexually Abused: No    FAMILY HISTORY:  Family History  Problem Relation Age of Onset   Stroke Father    Cirrhosis Mother    Colon cancer Neg Hx     CURRENT MEDICATIONS:  Outpatient Encounter Medications as of 07/12/2023  Medication Sig   apixaban  (ELIQUIS ) 5 MG TABS tablet Take 1 tablet (5 mg total) by mouth 2 (two) times daily.   atorvastatin  (LIPITOR ) 40 MG tablet Take 1 tablet (40 mg total) by mouth every evening. (Patient taking differently: Take 40 mg by mouth daily.)   budesonide -glycopyrrolate -formoterol  (BREZTRI  AEROSPHERE) 160-9-4.8 MCG/ACT AERO inhaler Inhale 2 puffs into the lungs 2 (two) times daily.   carvedilol  (COREG ) 25 MG tablet Take 1 tablet (25 mg total) by mouth 2 (two) times daily with a meal.   dicyclomine  (BENTYL ) 20 MG tablet Take 1 tablet (20 mg total) by mouth 2 (two) times daily.   losartan  (COZAAR ) 25 MG tablet Take 1 tablet (25 mg total) by mouth daily.   nicotine  (NICODERM CQ  - DOSED IN MG/24 HOURS) 21 mg/24hr patch Place 1 patch (21 mg total) onto the skin daily.   nitroGLYCERIN  (NITROSTAT ) 0.4 MG SL tablet Place 1 tablet (0.4 mg total) under the tongue every 5 (five) minutes as needed for chest pain.   potassium chloride  SA (KLOR-CON  M) 20  MEQ tablet Take 1 tablet (20 mEq total) by mouth 2 (two) times daily. Take Potassium Tablet while  Taking Demadex /Torsemide    torsemide  (DEMADEX ) 20 MG tablet Take 1 tablet (20 mg total) by mouth See admin instructions. Take 2 Tabs (40 mg) q am and 1 tab (20 mg) QPM--   Vericiguat  (VERQUVO ) 5 MG TABS Take 1 tablet (5 mg total) by mouth daily.   No facility-administered encounter medications on file as of 07/12/2023.    ALLERGIES:  Allergies  Allergen Reactions   Entresto  [Sacubitril -Valsartan ] Other (See Comments)    Orthostatic Hypotension Per pt, he has no allergies   Sglt2 Inhibitors Other (See Comments)    UTI's Per pt, he has no allergies    LABORATORY DATA:  I have reviewed the labs as listed.  CBC    Component Value Date/Time   WBC 17.0 (H) 07/08/2023 1901   RBC 4.14 (L) 07/08/2023 1901   HGB 11.1 (L) 07/08/2023 1901   HGB 13.8 04/23/2011 0919   HCT 36.7 (L) 07/08/2023 1901   HCT 26.0 (L) 05/09/2018 0455   PLT 142 (L) 07/08/2023 1901   PLT 208 04/23/2011 0919   MCV 88.6 07/08/2023 1901   MCV 84 04/23/2011 0919   MCH 26.8 07/08/2023 1901   MCHC 30.2 07/08/2023 1901   RDW 16.5 (H) 07/08/2023 1901   RDW 12.3 04/23/2011 0919   LYMPHSABS 0.7 07/08/2023 1901   LYMPHSABS 1.1 04/23/2011 0919   MONOABS 0.9 07/08/2023 1901   MONOABS 0.9 (H) 04/23/2011 0919   EOSABS 0.0 07/08/2023 1901   EOSABS 0.0 04/23/2011 0919   BASOSABS 0.0 07/08/2023 1901   BASOSABS 0.0 04/23/2011 0919      Latest Ref Rng & Units 07/08/2023    7:01 PM 07/07/2023    7:36 PM 07/07/2023    7:33 PM  CMP  Glucose 70 - 99 mg/dL 409  811  914   BUN 8 - 23 mg/dL 57  55  52   Creatinine 0.61 - 1.24 mg/dL 7.82  9.56  2.13   Sodium 135 - 145 mmol/L 140  140  139   Potassium 3.5 - 5.1 mmol/L 3.4  3.4  3.4   Chloride 98 - 111 mmol/L 98  93  91   CO2 22 - 32 mmol/L 34  35    Calcium  8.9 - 10.3 mg/dL 8.1  8.4    Total Protein 6.5 - 8.1 g/dL  5.5    Total Bilirubin 0.0 - 1.2 mg/dL  1.5    Alkaline Phos 38 -  126 U/L  85    AST 15 - 41 U/L  25    ALT 0 - 44 U/L  23      DIAGNOSTIC IMAGING:  I have independently reviewed the relevant imaging and discussed with the patient.   WRAP UP:  All questions were answered. The patient knows to call the clinic with any problems, questions or concerns.  Medical decision making: Moderate***  Time spent on visit: I spent 20 minutes counseling the patient face to face. The total time spent in the appointment was 30 minutes and more than 50% was on counseling.  Sonnie Dusky, PA-C  ***

## 2023-07-12 ENCOUNTER — Emergency Department (HOSPITAL_COMMUNITY)

## 2023-07-12 ENCOUNTER — Inpatient Hospital Stay

## 2023-07-12 ENCOUNTER — Encounter (HOSPITAL_COMMUNITY): Payer: Self-pay | Admitting: Emergency Medicine

## 2023-07-12 ENCOUNTER — Inpatient Hospital Stay: Payer: 59 | Attending: Hematology | Admitting: Physician Assistant

## 2023-07-12 ENCOUNTER — Emergency Department (HOSPITAL_COMMUNITY)
Admission: EM | Admit: 2023-07-12 | Discharge: 2023-07-12 | Disposition: A | Discharge status: Home / Self Care | Attending: Emergency Medicine | Admitting: Emergency Medicine

## 2023-07-12 ENCOUNTER — Other Ambulatory Visit: Payer: Self-pay

## 2023-07-12 VITALS — BP 73/49 | HR 54 | Temp 97.7°F | Resp 20

## 2023-07-12 DIAGNOSIS — I4891 Unspecified atrial fibrillation: Secondary | ICD-10-CM | POA: Diagnosis not present

## 2023-07-12 DIAGNOSIS — C44311 Basal cell carcinoma of skin of nose: Secondary | ICD-10-CM | POA: Diagnosis not present

## 2023-07-12 DIAGNOSIS — Z85818 Personal history of malignant neoplasm of other sites of lip, oral cavity, and pharynx: Secondary | ICD-10-CM | POA: Diagnosis not present

## 2023-07-12 DIAGNOSIS — D696 Thrombocytopenia, unspecified: Secondary | ICD-10-CM | POA: Diagnosis not present

## 2023-07-12 DIAGNOSIS — I5023 Acute on chronic systolic (congestive) heart failure: Secondary | ICD-10-CM | POA: Diagnosis not present

## 2023-07-12 DIAGNOSIS — S7001XA Contusion of right hip, initial encounter: Secondary | ICD-10-CM | POA: Insufficient documentation

## 2023-07-12 DIAGNOSIS — D72829 Elevated white blood cell count, unspecified: Secondary | ICD-10-CM | POA: Diagnosis not present

## 2023-07-12 DIAGNOSIS — I2510 Atherosclerotic heart disease of native coronary artery without angina pectoris: Secondary | ICD-10-CM | POA: Diagnosis not present

## 2023-07-12 DIAGNOSIS — E559 Vitamin D deficiency, unspecified: Secondary | ICD-10-CM | POA: Insufficient documentation

## 2023-07-12 DIAGNOSIS — Z9181 History of falling: Secondary | ICD-10-CM | POA: Diagnosis not present

## 2023-07-12 DIAGNOSIS — Z79899 Other long term (current) drug therapy: Secondary | ICD-10-CM | POA: Insufficient documentation

## 2023-07-12 DIAGNOSIS — N1832 Chronic kidney disease, stage 3b: Secondary | ICD-10-CM | POA: Diagnosis not present

## 2023-07-12 DIAGNOSIS — K8020 Calculus of gallbladder without cholecystitis without obstruction: Secondary | ICD-10-CM | POA: Diagnosis not present

## 2023-07-12 DIAGNOSIS — X58XXXA Exposure to other specified factors, initial encounter: Secondary | ICD-10-CM | POA: Insufficient documentation

## 2023-07-12 DIAGNOSIS — J449 Chronic obstructive pulmonary disease, unspecified: Secondary | ICD-10-CM | POA: Diagnosis not present

## 2023-07-12 DIAGNOSIS — I130 Hypertensive heart and chronic kidney disease with heart failure and stage 1 through stage 4 chronic kidney disease, or unspecified chronic kidney disease: Secondary | ICD-10-CM | POA: Insufficient documentation

## 2023-07-12 DIAGNOSIS — I509 Heart failure, unspecified: Secondary | ICD-10-CM | POA: Insufficient documentation

## 2023-07-12 DIAGNOSIS — R296 Repeated falls: Secondary | ICD-10-CM | POA: Diagnosis not present

## 2023-07-12 DIAGNOSIS — Z539 Procedure and treatment not carried out, unspecified reason: Secondary | ICD-10-CM

## 2023-07-12 DIAGNOSIS — D509 Iron deficiency anemia, unspecified: Secondary | ICD-10-CM | POA: Insufficient documentation

## 2023-07-12 DIAGNOSIS — Z7901 Long term (current) use of anticoagulants: Secondary | ICD-10-CM | POA: Diagnosis not present

## 2023-07-12 DIAGNOSIS — Z043 Encounter for examination and observation following other accident: Secondary | ICD-10-CM | POA: Diagnosis not present

## 2023-07-12 DIAGNOSIS — I160 Hypertensive urgency: Secondary | ICD-10-CM | POA: Diagnosis not present

## 2023-07-12 DIAGNOSIS — J441 Chronic obstructive pulmonary disease with (acute) exacerbation: Secondary | ICD-10-CM | POA: Diagnosis not present

## 2023-07-12 DIAGNOSIS — D500 Iron deficiency anemia secondary to blood loss (chronic): Secondary | ICD-10-CM

## 2023-07-12 DIAGNOSIS — D631 Anemia in chronic kidney disease: Secondary | ICD-10-CM | POA: Insufficient documentation

## 2023-07-12 DIAGNOSIS — N183 Chronic kidney disease, stage 3 unspecified: Secondary | ICD-10-CM | POA: Insufficient documentation

## 2023-07-12 DIAGNOSIS — S7011XA Contusion of right thigh, initial encounter: Secondary | ICD-10-CM | POA: Diagnosis not present

## 2023-07-12 DIAGNOSIS — E440 Moderate protein-calorie malnutrition: Secondary | ICD-10-CM | POA: Diagnosis not present

## 2023-07-12 DIAGNOSIS — I129 Hypertensive chronic kidney disease with stage 1 through stage 4 chronic kidney disease, or unspecified chronic kidney disease: Secondary | ICD-10-CM | POA: Insufficient documentation

## 2023-07-12 DIAGNOSIS — Z85828 Personal history of other malignant neoplasm of skin: Secondary | ICD-10-CM | POA: Diagnosis not present

## 2023-07-12 DIAGNOSIS — R5383 Other fatigue: Secondary | ICD-10-CM | POA: Diagnosis present

## 2023-07-12 DIAGNOSIS — S3022XA Contusion of scrotum and testes, initial encounter: Secondary | ICD-10-CM | POA: Diagnosis not present

## 2023-07-12 DIAGNOSIS — I959 Hypotension, unspecified: Secondary | ICD-10-CM | POA: Diagnosis not present

## 2023-07-12 DIAGNOSIS — I440 Atrioventricular block, first degree: Secondary | ICD-10-CM | POA: Diagnosis not present

## 2023-07-12 DIAGNOSIS — R0602 Shortness of breath: Secondary | ICD-10-CM | POA: Diagnosis not present

## 2023-07-12 LAB — HEPATIC FUNCTION PANEL
ALT: 26 U/L (ref 0–44)
AST: 25 U/L (ref 15–41)
Albumin: 2.8 g/dL — ABNORMAL LOW (ref 3.5–5.0)
Alkaline Phosphatase: 115 U/L (ref 38–126)
Bilirubin, Direct: 0.2 mg/dL (ref 0.0–0.2)
Indirect Bilirubin: 0.9 mg/dL (ref 0.3–0.9)
Total Bilirubin: 1.1 mg/dL (ref 0.0–1.2)
Total Protein: 5.7 g/dL — ABNORMAL LOW (ref 6.5–8.1)

## 2023-07-12 LAB — COMPREHENSIVE METABOLIC PANEL WITH GFR
ALT: 26 U/L (ref 0–44)
AST: 24 U/L (ref 15–41)
Albumin: 2.6 g/dL — ABNORMAL LOW (ref 3.5–5.0)
Alkaline Phosphatase: 110 U/L (ref 38–126)
Anion gap: 10 (ref 5–15)
BUN: 49 mg/dL — ABNORMAL HIGH (ref 8–23)
CO2: 28 mmol/L (ref 22–32)
Calcium: 8.3 mg/dL — ABNORMAL LOW (ref 8.9–10.3)
Chloride: 101 mmol/L (ref 98–111)
Creatinine, Ser: 1.77 mg/dL — ABNORMAL HIGH (ref 0.61–1.24)
GFR, Estimated: 41 mL/min — ABNORMAL LOW (ref >60)
Glucose, Bld: 182 mg/dL — ABNORMAL HIGH (ref 70–99)
Potassium: 3.4 mmol/L — ABNORMAL LOW (ref 3.5–5.1)
Sodium: 139 mmol/L (ref 135–145)
Total Bilirubin: 1.1 mg/dL (ref 0.0–1.2)
Total Protein: 5.3 g/dL — ABNORMAL LOW (ref 6.5–8.1)

## 2023-07-12 LAB — CBC WITH DIFFERENTIAL/PLATELET
Abs Immature Granulocytes: 0.11 10*3/uL — ABNORMAL HIGH (ref 0.00–0.07)
Abs Immature Granulocytes: 0.1 10*3/uL — ABNORMAL HIGH (ref 0.00–0.07)
Basophils Absolute: 0 10*3/uL (ref 0.0–0.1)
Basophils Absolute: 0 10*3/uL (ref 0.0–0.1)
Basophils Relative: 0 %
Basophils Relative: 0 %
Eosinophils Absolute: 0.1 10*3/uL (ref 0.0–0.5)
Eosinophils Absolute: 0.1 10*3/uL (ref 0.0–0.5)
Eosinophils Relative: 1 %
Eosinophils Relative: 1 %
HCT: 32.7 % — ABNORMAL LOW (ref 39.0–52.0)
HCT: 33.9 % — ABNORMAL LOW (ref 39.0–52.0)
Hemoglobin: 9.9 g/dL — ABNORMAL LOW (ref 13.0–17.0)
Hemoglobin: 10.2 g/dL — ABNORMAL LOW (ref 13.0–17.0)
Immature Granulocytes: 1 %
Immature Granulocytes: 1 %
Lymphocytes Relative: 6 %
Lymphocytes Relative: 6 %
Lymphs Abs: 0.9 10*3/uL (ref 0.7–4.0)
Lymphs Abs: 0.7 10*3/uL (ref 0.7–4.0)
MCH: 26.6 pg (ref 26.0–34.0)
MCH: 26.6 pg (ref 26.0–34.0)
MCHC: 30.3 g/dL (ref 30.0–36.0)
MCHC: 30.1 g/dL (ref 30.0–36.0)
MCV: 87.9 fL (ref 80.0–100.0)
MCV: 88.3 fL (ref 80.0–100.0)
Monocytes Absolute: 0.9 10*3/uL (ref 0.1–1.0)
Monocytes Absolute: 0.7 10*3/uL (ref 0.1–1.0)
Monocytes Relative: 6 %
Monocytes Relative: 6 %
Neutro Abs: 11.6 10*3/uL — ABNORMAL HIGH (ref 1.7–7.7)
Neutro Abs: 10.3 10*3/uL — ABNORMAL HIGH (ref 1.7–7.7)
Neutrophils Relative %: 86 %
Neutrophils Relative %: 86 %
Platelets: 134 10*3/uL — ABNORMAL LOW (ref 150–400)
Platelets: 132 10*3/uL — ABNORMAL LOW (ref 150–400)
RBC: 3.72 MIL/uL — ABNORMAL LOW (ref 4.22–5.81)
RBC: 3.84 MIL/uL — ABNORMAL LOW (ref 4.22–5.81)
RDW: 17.2 % — ABNORMAL HIGH (ref 11.5–15.5)
RDW: 17.1 % — ABNORMAL HIGH (ref 11.5–15.5)
WBC: 13.5 10*3/uL — ABNORMAL HIGH (ref 4.0–10.5)
WBC: 11.9 10*3/uL — ABNORMAL HIGH (ref 4.0–10.5)
nRBC: 0 % (ref 0.0–0.2)
nRBC: 0 % (ref 0.0–0.2)

## 2023-07-12 LAB — TROPONIN I (HIGH SENSITIVITY)
Troponin I (High Sensitivity): 86 ng/L — ABNORMAL HIGH (ref <18)
Troponin I (High Sensitivity): 72 ng/L — ABNORMAL HIGH (ref <18)

## 2023-07-12 LAB — URINALYSIS, ROUTINE W REFLEX MICROSCOPIC
Bilirubin Urine: NEGATIVE
Glucose, UA: NEGATIVE mg/dL
Hgb urine dipstick: NEGATIVE
Ketones, ur: NEGATIVE mg/dL
Leukocytes,Ua: NEGATIVE
Nitrite: NEGATIVE
Protein, ur: NEGATIVE mg/dL
Specific Gravity, Urine: 1.006 (ref 1.005–1.030)
pH: 7 (ref 5.0–8.0)

## 2023-07-12 LAB — BASIC METABOLIC PANEL WITH GFR
Anion gap: 10 (ref 5–15)
BUN: 50 mg/dL — ABNORMAL HIGH (ref 8–23)
CO2: 29 mmol/L (ref 22–32)
Calcium: 8.5 mg/dL — ABNORMAL LOW (ref 8.9–10.3)
Chloride: 101 mmol/L (ref 98–111)
Creatinine, Ser: 1.85 mg/dL — ABNORMAL HIGH (ref 0.61–1.24)
GFR, Estimated: 39 mL/min — ABNORMAL LOW (ref >60)
Glucose, Bld: 147 mg/dL — ABNORMAL HIGH (ref 70–99)
Potassium: 3.6 mmol/L (ref 3.5–5.1)
Sodium: 140 mmol/L (ref 135–145)

## 2023-07-12 LAB — IRON AND TIBC
Iron: 55 ug/dL (ref 45–182)
Saturation Ratios: 19 % (ref 17.9–39.5)
TIBC: 291 ug/dL (ref 250–450)
UIBC: 236 ug/dL

## 2023-07-12 LAB — BRAIN NATRIURETIC PEPTIDE: B Natriuretic Peptide: 1777 pg/mL — ABNORMAL HIGH (ref 0.0–100.0)

## 2023-07-12 LAB — FERRITIN: Ferritin: 87 ng/mL (ref 24–336)

## 2023-07-12 LAB — VITAMIN D 25 HYDROXY (VIT D DEFICIENCY, FRACTURES): Vit D, 25-Hydroxy: 35.38 ng/mL (ref 30–100)

## 2023-07-12 MED ORDER — SODIUM CHLORIDE 0.9 % IV BOLUS
1000.0000 mL | Freq: Once | INTRAVENOUS | Status: AC
  Administered 2023-07-12: 1000 mL via INTRAVENOUS

## 2023-07-12 NOTE — ED Notes (Addendum)
 Patient resides at [facility] [address] [place], [place] [address] [phone] option 0

## 2023-07-12 NOTE — Discharge Instructions (Addendum)
 Thank you for coming to [facility] Emergency Department. You were seen for falls. We did an exam, labs, and imaging, and these showed low blood pressure which improved with fluids. You had stable blood pressure on evaluation for the next several hours in the ED. Please continue to eat/drink well. Your platelets also seem to be decreasing, please have these rechecked with your PCP. Please follow up with your primary care provider within 1 week. You will be discharged back to your facility.  Do not hesitate to return to the ED or call 911 if you experience: -Worsening symptoms -Chest pain, shortness of breath -head trauma, loss of consciousness after a fall -Lightheadedness, passing out -Fevers/chills -Anything else that concerns you

## 2023-07-12 NOTE — ED Provider Notes (Signed)
 [place] EMERGENCY DEPARTMENT AT [facility] Provider Note   CSN: [id number] Arrival date & time: 07/12/23  1353     History  Chief Complaint  Patient presents with   Hypotension    Michael [name] is a 68 y.o. male with PMH as listed below who presents from [facility] for hypotension and lethargy. Patient was at an oncology office appointment this morning when he was noted to be hypotensive and mildly lethargic per documentation.  Vital signs were hypotensive into the 70s and 80s systolic with heart rate 50s and afebrile.  Patient reports no complaints.  He states that he normally has balance problems and therefore falls frequently which is why he is staying at a rehab facility.  He is not currently staying with a friend, states that was a miscommunication and he is currently living at the facility.  Patient states he has no pain anywhere from the falls.  States he did not hit his head or lose consciousness in any of his recent falls.  He denies any chest pain, shortness of breath, cough, flulike symptoms, urinary symptoms.  States he has been eating and drinking well.  Denies any recent changes to his medicines.  Denies any lightheadedness, presyncope/syncope, dizziness, headache, visual changes.  Patient was of previous TOC hold in the ED for multiple falls and need for rehab/SNF placement.   Echo 04/20/23 shows LVEF 20-25%, w/ dilation in LV and mild LVH, G2DD, mildly reduced RV function.   Past Medical History:  Diagnosis Date   Aortic atherosclerosis (HCC) 08/03/2021   Atrial fibrillation (HCC)    CAD (coronary artery disease) 08/03/2021   Cardiac catheterization September 2023 with RCA/RV marginal stenosis managed medically   Cardiomyopathy (HCC)    Carotid artery disease (HCC)    CKD (chronic kidney disease) 06/28/2023   CKD (chronic kidney disease) stage 3, GFR 30-59 ml/min (HCC)    COPD (chronic obstructive pulmonary disease) (HCC)    Essential hypertension     Head and neck cancer (HCC) 2019   Right facial basal cell carcinoma s/p resection with right partial mastectomy and partal rhinectomy with skin graft (06/13/17)   Iron  deficiency anemia    Mass of colon 08/06/2021   Formatting of this note might be different from the original. Last Assessment & Plan:  Formatting of this note might be different from the original.  Status post right hemicolectomy by Dr. Kallie today 7/12.    -Will check postop CBC and BMP     Urinary retention        Home Medications Prior to Admission medications   Medication Sig Start Date End Date Taking? Authorizing Provider  apixaban  (ELIQUIS ) 5 MG TABS tablet Take 1 tablet (5 mg total) by mouth 2 (two) times daily. 07/02/23  Yes Emokpae, Courage, MD  atorvastatin  (LIPITOR ) 40 MG tablet Take 1 tablet (40 mg total) by mouth every evening. Patient taking differently: Take 40 mg by mouth daily. 08/01/22 02/05/24 Yes Shahmehdi, Adriana LABOR, MD  budesonide -glycopyrrolate -formoterol  (BREZTRI  AEROSPHERE) 160-9-4.8 MCG/ACT AERO inhaler Inhale 2 puffs into the lungs 2 (two) times daily. 07/02/23  Yes Pearlean Manus, MD  carvedilol  (COREG ) 25 MG tablet Take 1 tablet (25 mg total) by mouth 2 (two) times daily with a meal. 07/02/23  Yes Emokpae, Courage, MD  dicyclomine  (BENTYL ) 20 MG tablet Take 1 tablet (20 mg total) by mouth 2 (two) times daily. 05/31/23  Yes Beatty, Celeste A, PA-C  losartan  (COZAAR ) 25 MG tablet Take 1 tablet (25 mg  total) by mouth daily. 07/02/23  Yes Emokpae, Courage, MD  nicotine  (NICODERM CQ  - DOSED IN MG/24 HOURS) 21 mg/24hr patch Place 1 patch (21 mg total) onto the skin daily. 07/02/23  Yes Emokpae, Courage, MD  nitroGLYCERIN  (NITROSTAT ) 0.4 MG SL tablet Place 1 tablet (0.4 mg total) under the tongue every 5 (five) minutes as needed for chest pain. 02/05/23  Yes Franklyn Sid SAILOR, MD  potassium chloride  SA (KLOR-CON  M) 20 MEQ tablet Take 1 tablet (20 mEq total) by mouth 2 (two) times daily. Take Potassium Tablet while Taking  Demadex /Torsemide  07/02/23 08/01/23 Yes Emokpae, Courage, MD  torsemide  (DEMADEX ) 20 MG tablet Take 1 tablet (20 mg total) by mouth See admin instructions. Take 2 Tabs (40 mg) q am and 1 tab (20 mg) QPM-- 07/02/23  Yes Emokpae, Courage, MD  Vericiguat  (VERQUVO ) 5 MG TABS Take 1 tablet (5 mg total) by mouth daily. 07/02/23  Yes Pearlean Manus, MD      Allergies    Entresto  [sacubitril -valsartan ] and Sglt2 inhibitors    Review of Systems   Review of Systems A 10 point review of systems was performed and is negative unless otherwise reported in HPI.  Physical Exam Updated Vital Signs BP (!) 148/56   Pulse 66   Temp 97.9 F (36.6 C) (Oral)   Resp 18   Ht 6' (1.829 m)   Wt 63 kg   SpO2 97%   BMI 18.84 kg/m  Physical Exam General: Normal appearing male, lying in bed.  HEENT: NCAT, PERRLA, Sclera anicteric, MMM, trachea midline.  Prior facial surgery due to history of BCC of nasolabial groove. Cardiology: RRR, no murmurs/rubs/gallops. BL radial and DP pulses equal bilaterally.  Resp: Normal respiratory rate and effort. CTAB, no wheezes, rhonchi, crackles.  Abd: Soft, non-tender, non-distended. No rebound tenderness or guarding.  GU: Performed with RN chaperone. Ecchymoses to scrotum without significant swelling or pain to palpation.  Pelvis stable nontender. MSK: No peripheral edema.  Ecchymosis noted to right lateral thigh and right hip region without any expanding hematoma, large hematoma.  Compartments soft.  Intact range of motion to bilateral lower extremities not limited by pain.  NVI.  Skin: warm, dry.  Back: No CVA tenderness.  No midline CTL spine tender palpation deformities or step-offs. Neuro: A&Ox4, CNs II-XII grossly intact. MAEs. Sensation grossly intact.  Psych: Normal mood and affect.   ED Results / Procedures / Treatments   Labs (all labs ordered are listed, but only abnormal results are displayed) Labs Reviewed  BASIC METABOLIC PANEL WITH GFR - Abnormal; Notable for the  following components:      Result Value   Glucose, Bld 147 (*)    BUN 50 (*)    Creatinine, Ser 1.85 (*)    Calcium  8.5 (*)    GFR, Estimated 39 (*)    All other components within normal limits  CBC WITH DIFFERENTIAL/PLATELET - Abnormal; Notable for the following components:   WBC 11.9 (*)    RBC 3.84 (*)    Hemoglobin 10.2 (*)    HCT 33.9 (*)    RDW 17.1 (*)    Platelets 132 (*)    Neutro Abs 10.3 (*)    Abs Immature Granulocytes 0.10 (*)    All other components within normal limits  URINALYSIS, ROUTINE W REFLEX MICROSCOPIC - Abnormal; Notable for the following components:   Color, Urine STRAW (*)    All other components within normal limits  HEPATIC FUNCTION PANEL - Abnormal; Notable for the following components:  Total Protein 5.7 (*)    Albumin  2.8 (*)    All other components within normal limits  BRAIN NATRIURETIC PEPTIDE - Abnormal; Notable for the following components:   B Natriuretic Peptide 1,777.0 (*)    All other components within normal limits  TROPONIN I (HIGH SENSITIVITY) - Abnormal; Notable for the following components:   Troponin I (High Sensitivity) 86 (*)    All other components within normal limits  TROPONIN I (HIGH SENSITIVITY) - Abnormal; Notable for the following components:   Troponin I (High Sensitivity) 72 (*)    All other components within normal limits  CULTURE, BLOOD (ROUTINE X 2)  CULTURE, BLOOD (ROUTINE X 2)    EKG EKG Interpretation Date/Time:  Wednesday July 12 2023 14:04:16 EDT Ventricular Rate:  52 PR Interval:  288 QRS Duration:  127 QT Interval:  472 QTC Calculation: 439 R Axis:   51  Text Interpretation: Sinus or ectopic atrial rhythm Prolonged PR interval Left bundle branch block No sig changes from Jul 07 2023 Confirmed by Cottie Cough [phone]) on 07/12/2023 2:44:56 PM  Radiology No results found.   Procedures .Critical Care  Performed by: Franklyn Sid SAILOR, MD Authorized by: Franklyn Sid SAILOR, MD   Critical care provider  statement:    Critical care time (minutes):  30   Critical care was necessary to treat or prevent imminent or life-threatening deterioration of the following conditions:  Shock   Critical care was time spent personally by me on the following activities:  Development of treatment plan with patient or surrogate, evaluation of patient's response to treatment, examination of patient, ordering and review of laboratory studies, ordering and review of radiographic studies, ordering and performing treatments and interventions, pulse oximetry, re-evaluation of patient's condition and review of old charts     Medications Ordered in ED Medications  sodium chloride  0.9 % bolus 1,000 mL (0 mLs Intravenous Stopped 07/12/23 2354)    ED Course/ Medical Decision Making/ A&P                          Medical Decision Making Amount and/or Complexity of Data Reviewed Labs: ordered. Decision-making details documented in ED Course. Radiology: ordered. Decision-making details documented in ED Course.    This patient presents to the ED for concern of hypotension from cancer center, this involves an extensive number of treatment options, and is a complaint that carries with it a high risk of complications and morbidity.  I considered the following differential and admission for this acute, potentially life threatening condition.  Patient is hypotensive on arrival into the 70s and 80s systolic.  He is very well-appearing and reports no symptoms.  MDM:    Patient with hypotension from cancer center.  Reports has been eating and drinking well.  He is given a 1L bolus of fluid with significant improvement in his vital signs.  Consider possible hypovolemia as a cause of his symptoms.  Patient does have a history of significant heart failure and would hesitate to give him any additional fluid but after just a single liter his blood pressure stabilizes into the 130s systolic and his hypotension resolved.  Patient had never  felt any dizziness or any symptoms, reported that he had been feeling well all day.  He does not appear lethargic to me, I have seen him in the emergency department before.  He does have a 1 point drop in his hemoglobin from 11.1 on 07/08/2023 down to 10.2 here  today.  He does not have any large expanding hematoma or significant sized hematoma on exam.  No concern for intrathoracic or intra-abdominal bleeding.  Patient did not report any significant extracorporal bleeding.  He denies any hematochezia or melena.  Does have a history in 2022 of Hemoccult positive stool, however patient does have known chronic anemia that is managed with intermittent IV iron  infusions.  Lower concern for hemorrhagic shock, patient without any bleeding in the emergency department over the course of a total of a 10-hour observation period.  I considered admission for this patient but believe he is stable for outpatient follow-up.  Other causes of hypotension: Patient does not describe any chest pain or shortness of breath, no increased lower extremity edema, and his troponin is elevated but stable and BNP is improving.  Very low concern for any cardiogenic shock or acute on chronic heart failure.  He is afebrile with a downtrending leukocytosis, no reports of any infectious symptoms, UA is negative, low concern for septic shock.  No rashes or urticaria to indicate anaphylactic shock.  Discussed with the patient who states he feels fine.  Patient reconfirms that he is staying at [facility] and he would like to be discharged back there.  I encouraged him to follow-up with his oncologist next week.  Clinical Course as of 07/21/23 1002  Wed Jul 12, 2023  1523 BP(!): 80/42 [HN]  1524 DG Chest Port 1 View No active disease. [HN]  1524 WBC(!): 11.9 +leukocytosis w/ left shift, but downtrending from prior [HN]  1524 Troponin I (High Sensitivity)(!): 86 Mildly elevated, less than priors, will trend [HN]  1545 Bp now 114/85 [HN]   1658 B Natriuretic Peptide(!): 1,777.0 Decreased from 3,290 a few days ago [HN]  1658 Hepatic function panel(!) Unremarkable in the context of this patient's presentation  [HN]  1734 BP: 112/78 BPs have been stable [HN]  1759 Troponin I (High Sensitivity)(!): 72 Mildly elevated, relatively stable [HN]  1808 Urinalysis, Routine w reflex microscopic -Urine, Clean Catch(!) Neg UA [HN]  1859 BP: 136/65 [HN]  2024 Patient reevaluated.  His workup is reassuring.  He states that he feels well and would like to be discharged back to his facility.  Earlier there seem to have been can some confusion and it was thought that patient was staying with a friend and no longer at the facility.  On clarification, patient states that he is currently still living at the facility.  He states he would like to be discharged back to the facility.  Patient has had stable blood pressure for the last several hours in the emergency department and was ambulated, he states he feels well and would like to be discharged. Will get blood cultures given leukocytosis and DC. He is also informed of decreasing thrombocytopenia. Has f/u with cancer center.  [HN]  2025 Platelets(!): 132 Decreasing thrombocytopenia.  No active bleeding. [HN]    Clinical Course User Index [HN] Franklyn Sid SAILOR, MD    Labs: I Ordered, and personally interpreted labs.  The pertinent results include:  those listed above  Imaging Studies ordered: I ordered imaging studies including pelvis XR I independently visualized and interpreted imaging. I agree with the radiologist interpretation  Additional history obtained from chart review.  Cardiac Monitoring: The patient was maintained on a cardiac monitor.  I personally viewed and interpreted the cardiac monitored which showed an underlying rhythm of: NSR  Reevaluation: After the interventions noted above, I reevaluated the patient and found that they have :  resolved  Social Determinants of  Health: Lives at SNF  Disposition:  DC back to [facility].  Given discharge instructions and return precautions, all questions answered to his satisfaction.  Co morbidities that complicate the patient evaluation  Past Medical History:  Diagnosis Date   Aortic atherosclerosis (HCC) 08/03/2021   Atrial fibrillation (HCC)    CAD (coronary artery disease) 08/03/2021   Cardiac catheterization September 2023 with RCA/RV marginal stenosis managed medically   Cardiomyopathy (HCC)    Carotid artery disease (HCC)    CKD (chronic kidney disease) 06/28/2023   CKD (chronic kidney disease) stage 3, GFR 30-59 ml/min (HCC)    COPD (chronic obstructive pulmonary disease) (HCC)    Essential hypertension    Head and neck cancer (HCC) 2019   Right facial basal cell carcinoma s/p resection with right partial mastectomy and partal rhinectomy with skin graft (06/13/17)   Iron  deficiency anemia    Mass of colon 08/06/2021   Formatting of this note might be different from the original. Last Assessment & Plan:  Formatting of this note might be different from the original.  Status post right hemicolectomy by Dr. Kallie today 7/12.    -Will check postop CBC and BMP     Urinary retention      Medicines Meds ordered this encounter  Medications   sodium chloride  0.9 % bolus 1,000 mL    I have reviewed the patients home medicines and have made adjustments as needed  Problem List / ED Course: Problem List Items Addressed This Visit   None Visit Diagnoses       Recurrent falls while walking    -  Primary     Hypotension, unspecified hypotension type         Thrombocytopenia (HCC)                       This note was created using dictation software, which may contain spelling or grammatical errors.    Franklyn Sid SAILOR, MD 07/21/23 1003

## 2023-07-12 NOTE — ED Triage Notes (Signed)
 Pt brought to ED from [facility] for hypotension and lethargy. Pt has had multiple falls recently and was a previous TOC hold in the ED. Pt went to facility for rehab he says but now states he lives with a friend.   Pt has large bruises to right hip and scrotum.

## 2023-07-14 DIAGNOSIS — K8020 Calculus of gallbladder without cholecystitis without obstruction: Secondary | ICD-10-CM | POA: Diagnosis not present

## 2023-07-14 DIAGNOSIS — I440 Atrioventricular block, first degree: Secondary | ICD-10-CM | POA: Diagnosis not present

## 2023-07-14 DIAGNOSIS — Z85818 Personal history of malignant neoplasm of other sites of lip, oral cavity, and pharynx: Secondary | ICD-10-CM | POA: Diagnosis not present

## 2023-07-14 DIAGNOSIS — I160 Hypertensive urgency: Secondary | ICD-10-CM | POA: Diagnosis not present

## 2023-07-14 DIAGNOSIS — Z9181 History of falling: Secondary | ICD-10-CM | POA: Diagnosis not present

## 2023-07-14 DIAGNOSIS — I5023 Acute on chronic systolic (congestive) heart failure: Secondary | ICD-10-CM | POA: Diagnosis not present

## 2023-07-14 DIAGNOSIS — N1832 Chronic kidney disease, stage 3b: Secondary | ICD-10-CM | POA: Diagnosis not present

## 2023-07-14 DIAGNOSIS — I4891 Unspecified atrial fibrillation: Secondary | ICD-10-CM | POA: Diagnosis not present

## 2023-07-14 DIAGNOSIS — E440 Moderate protein-calorie malnutrition: Secondary | ICD-10-CM | POA: Diagnosis not present

## 2023-07-14 DIAGNOSIS — J441 Chronic obstructive pulmonary disease with (acute) exacerbation: Secondary | ICD-10-CM | POA: Diagnosis not present

## 2023-07-14 DIAGNOSIS — I130 Hypertensive heart and chronic kidney disease with heart failure and stage 1 through stage 4 chronic kidney disease, or unspecified chronic kidney disease: Secondary | ICD-10-CM | POA: Diagnosis not present

## 2023-07-14 DIAGNOSIS — E559 Vitamin D deficiency, unspecified: Secondary | ICD-10-CM | POA: Diagnosis not present

## 2023-07-17 DIAGNOSIS — Z85818 Personal history of malignant neoplasm of other sites of lip, oral cavity, and pharynx: Secondary | ICD-10-CM | POA: Diagnosis not present

## 2023-07-17 DIAGNOSIS — Z9181 History of falling: Secondary | ICD-10-CM | POA: Diagnosis not present

## 2023-07-17 DIAGNOSIS — E559 Vitamin D deficiency, unspecified: Secondary | ICD-10-CM | POA: Diagnosis not present

## 2023-07-17 DIAGNOSIS — N1832 Chronic kidney disease, stage 3b: Secondary | ICD-10-CM | POA: Diagnosis not present

## 2023-07-17 DIAGNOSIS — I5023 Acute on chronic systolic (congestive) heart failure: Secondary | ICD-10-CM | POA: Diagnosis not present

## 2023-07-17 DIAGNOSIS — I440 Atrioventricular block, first degree: Secondary | ICD-10-CM | POA: Diagnosis not present

## 2023-07-17 DIAGNOSIS — E440 Moderate protein-calorie malnutrition: Secondary | ICD-10-CM | POA: Diagnosis not present

## 2023-07-17 DIAGNOSIS — I160 Hypertensive urgency: Secondary | ICD-10-CM | POA: Diagnosis not present

## 2023-07-17 DIAGNOSIS — I4891 Unspecified atrial fibrillation: Secondary | ICD-10-CM | POA: Diagnosis not present

## 2023-07-17 DIAGNOSIS — J441 Chronic obstructive pulmonary disease with (acute) exacerbation: Secondary | ICD-10-CM | POA: Diagnosis not present

## 2023-07-17 DIAGNOSIS — K8020 Calculus of gallbladder without cholecystitis without obstruction: Secondary | ICD-10-CM | POA: Diagnosis not present

## 2023-07-17 DIAGNOSIS — I130 Hypertensive heart and chronic kidney disease with heart failure and stage 1 through stage 4 chronic kidney disease, or unspecified chronic kidney disease: Secondary | ICD-10-CM | POA: Diagnosis not present

## 2023-07-17 LAB — CULTURE, BLOOD (ROUTINE X 2)
Culture: NO GROWTH
Culture: NO GROWTH
Special Requests: ADEQUATE
Special Requests: ADEQUATE

## 2023-07-18 ENCOUNTER — Other Ambulatory Visit: Payer: Self-pay | Admitting: Physician Assistant

## 2023-07-18 ENCOUNTER — Other Ambulatory Visit: Payer: Self-pay

## 2023-07-18 DIAGNOSIS — Z85818 Personal history of malignant neoplasm of other sites of lip, oral cavity, and pharynx: Secondary | ICD-10-CM | POA: Diagnosis not present

## 2023-07-18 DIAGNOSIS — I160 Hypertensive urgency: Secondary | ICD-10-CM | POA: Diagnosis not present

## 2023-07-18 DIAGNOSIS — D500 Iron deficiency anemia secondary to blood loss (chronic): Secondary | ICD-10-CM

## 2023-07-18 DIAGNOSIS — I130 Hypertensive heart and chronic kidney disease with heart failure and stage 1 through stage 4 chronic kidney disease, or unspecified chronic kidney disease: Secondary | ICD-10-CM | POA: Diagnosis not present

## 2023-07-18 DIAGNOSIS — K8020 Calculus of gallbladder without cholecystitis without obstruction: Secondary | ICD-10-CM | POA: Diagnosis not present

## 2023-07-18 DIAGNOSIS — N1832 Chronic kidney disease, stage 3b: Secondary | ICD-10-CM | POA: Diagnosis not present

## 2023-07-18 DIAGNOSIS — I5023 Acute on chronic systolic (congestive) heart failure: Secondary | ICD-10-CM | POA: Diagnosis not present

## 2023-07-18 DIAGNOSIS — E440 Moderate protein-calorie malnutrition: Secondary | ICD-10-CM | POA: Diagnosis not present

## 2023-07-18 DIAGNOSIS — E559 Vitamin D deficiency, unspecified: Secondary | ICD-10-CM | POA: Diagnosis not present

## 2023-07-18 DIAGNOSIS — J441 Chronic obstructive pulmonary disease with (acute) exacerbation: Secondary | ICD-10-CM | POA: Diagnosis not present

## 2023-07-18 DIAGNOSIS — I4891 Unspecified atrial fibrillation: Secondary | ICD-10-CM | POA: Diagnosis not present

## 2023-07-18 DIAGNOSIS — D649 Anemia, unspecified: Secondary | ICD-10-CM

## 2023-07-18 DIAGNOSIS — I440 Atrioventricular block, first degree: Secondary | ICD-10-CM | POA: Diagnosis not present

## 2023-07-18 DIAGNOSIS — Z9181 History of falling: Secondary | ICD-10-CM | POA: Diagnosis not present

## 2023-07-18 NOTE — Addendum Note (Signed)
 Addended by: Sheril Dines on: 07/18/2023 12:10 PM   Modules accepted: Orders

## 2023-07-18 NOTE — Progress Notes (Addendum)
 Venofer  400 mg x 1 to be given same day as office visit tomorrow (07/19/2023). Will also check repeat CBC/D and additional labs to rule out other causes of worsening anemia (B12, MMA, folate, copper, SPEP, immunofixation, light chains, reticulocytes, LDH).  Sheril Dines PA-C 07/18/2023 11:35 AM

## 2023-07-18 NOTE — Progress Notes (Addendum)
 [facility] [address], [place] [address]   CLINIC:  Medical Oncology/Hematology  PCP:  [name], MD [address] [place] [address] [phone]   REASON FOR VISIT:  Follow-up for iron  deficiency anemia  CURRENT THERAPY: Intermittent IV iron   INTERVAL HISTORY:   [name] 68 y.o. male returns for routine follow-up of iron  deficiency anemia.  He was last seen by Sheril Dines PA-C on 03/08/2023.  He received Venofer  400 mg x 1 on 03/13/2023.  He was initially scheduled for office visit on 07/12/2023, but this was incomplete as he needed to be sent down to ED due to hypotension and lethargy.  Per ED notes, patient improved with IV fluid resuscitation and was discharged back to [facility] in [place].  Since his last visit, he has been hospitalized four times: 03/31/2023 through 04/06/2023: Acute on chronic congestive heart failure with fluid overload 04/17/2023 through 04/21/2023: Acute on chronic combined systolic and diastolic CHF 05/10/2023 through 2/95/6213: Acute on chronic congestive heart failure with fluid overload 06/28/2023 through 07/02/2023: Acute on chronic CHF exacerbation + COPD exacerbation He has had frequent ED visits as well, further complicated by lack of stable living situation.  As of this time, he is staying at [facility] in [place].  At today's visit, he reports feeling poorly.  He has significant fatigue. Bowel movements are generally dark, and he denies any obvious bleeding such as bright blood per rectum or gross melena.   He has positional vertigo.  He denies any recent lightheadedness or syncopal episodes. No pica, restless legs, headaches, chest pain.  He has chronic dyspnea on exertion.  Labs from 07/12/23 show acute drop in hemoglobin with Hgb 9.9, compared to Hgb 11.1 on 07/08/2023.  Patient had a ED visit on 07/08/2023 due to fall, but denied any overt blood loss.   CT abdomen/pelvis obtained in the ED  showed right rectus hematoma, thought to be of subacute or chronic nature, particularly since this was not in the area of patient injury when he fell.  (NOTE: Patient claims to have fallen and landed on his right hip 2 days ago, but seems to have some difficulty with the timeline and other discrepancies in history.  Question that he may be confusing the dates from fall and landing on his right hip last week?)  Blood pressure noted to be low during visit today.  Initial BP 66/46, but manual recheck showed blood pressure 92/60.  Patient reports that he has not had anything to eat or drink today.  He reports that he usually drinks about 2 cups of water  each day.  (NOTE: Patient noted to have hypotension at his last office visit on 07/12/2023.  He was sent to ED, treated with IV fluids, and discharged.)  He has 25% energy and 25% appetite. He endorses that he is maintaining a stable weight.  ASSESSMENT & PLAN:  ## HYPOTENSION  - Blood pressure noted to be low during visit today.  Initial BP 66/46, but manual recheck showed blood pressure 92/60.  Patient reports that he has not had anything to eat or drink today.  He reports that he usually drinks about 2 cups of water  each day.  (NOTE: Patient noted to have hypotension at his last office visit on 07/12/2023.  He was sent to ED, treated with IV fluids, and discharged.) - Denies any acute lightheadedness/syncope, altered mental status, headache - PLAN: Recommend 1 L NS along with IV Venofer   today.  We will recheck BP.  If BP remains <90/60, will send to ED.  Otherwise, we will plan on discharging back to SNF with recommendations for PCP follow-up and increased fluid intake.  1.    Iron  deficiency anemia + anemia of CKD stage IIIb - Combination anemia from CKD, malabsorption and blood loss. - SPEP negative (2019) - Hemoccult stool was POSITIVE x3 (June 2022) - Most recent colonoscopy (07/19/2021) with villous, fungating, infiltrative and polypoid  nonobstructing mass in ascending colon (addressed below); no bleeding was present - Right hemicolectomy on 08/11/2021, pathology showed high-grade dysplasia negative for invasive carcinoma -- He has never had an EGD - Patient is hesitant to try oral iron  due to abdominal side effects - He has been on intermittent IV iron  infusions.  (Most recent IV Venofer  300 mg x 3 in November 2024) - Most recent labs (07/12/23): Hgb 9.9/MCV 87.9.  Ferritin 87, iron  saturation 19%. Patient also has CKD stage IIIb with creatinine more or less at baseline (1.77/GFR 41). - Intermittent dark stool and occasional red streaks (stool cards sent home, not completed) - Ongoing iron  deficiency despite IV repletion has been suspicious for ongoing occult blood loss, despite colon mass having been removed (right hemicolectomy on 08/11/2021) - PLAN: Proceed with IV Venofer  400 mg x 1 today. - Checking additional labs today (B12, MMA, folate, copper, SPEP, immunofixation, light chains, reticulocytes, LDH) to rule out other causes of worsening anemia - Repeat CBC/D, CMP, ferritin, iron /TIBC with same-day OFFICE visit in 6 weeks.  If he has persistent Hgb <10.0 despite adequate nutritional panel, would consider starting on ESA (Retacrit/Procrit) - Continue GI follow-up and consider EGD  - Patient is aware of alarm signs/symptoms that would prompt immediate medical attention - Leukocytosis noted (trending downward), possibly reactive after recent hospitalization for CHF/COPD, during which time he received both IV and oral steroids.  He remains on steroid inhaler.   2.  Locally advanced basal cell carcinoma right nasolabial fold: - Biopsy of the right nasal mucosa on 04/05/2017 with basal cell carcinoma. - Biopsy of the left cheek lesion on 04/02/2017 consistent with basal cell carcinoma. - Resection with skin graft on 06/06/2017.  Margin was close.  Rest of the margins were negative. - 10 rounds of radiation by Dr. Lurena Sally for recurrent  basal cell carcinoma to the right medial canthus completed on 03/20/2020.  - PLAN: Continue dermatology follow-up   3.  Right colon mass (tubular adenomas with high-grade dysplasia) - PET scan in 2019 (obtained due to basal cell skin cancer) demonstrated area of concern with mass in ascending colon - Colonoscopy in 2019 by Dr. Riley Cheadle demonstrated multiple polyps with tubular adenoma, and patient was referred for surgical excision of colon due to volume of polyps and concern for more extensive disease.  Patient was lost to follow-up with GI and did not follow-up with surgeon at this time. - Reestablished care with GI in May 2023, with colonoscopy he underwent colonoscopy  (07/19/2021) showing villous, fungating, infiltrative and polypoid nonobstructing mass in ascending colon - CT abdomen/pelvis (07/05/2021): Lobular intraluminal mass at: Of hepatic flexure measuring up to 8 cm in length; new pancreatic ductal dilation and atrophy favored to be sequela of chronic pancreatitis but MRI recommended - MRI abdomen (08/06/2021) showed upstream pancreatic atrophy and duct dilation secondary to dominant ductal calculi; no convincing evidence of underlying neoplasm - Due to large size of residual tubular adenoma/right colon mass, patient underwent right hemicolectomy on 08/11/2021.  Surgical pathology showed multiple tubular  adenomas, several of which showed focal high-grade dysplasia, but were negative for invasive carcinoma.   - PLAN: Continue follow-up with GI.  (Patient was no-show and lost to follow-up with GI in May 2024.  Patient has been provided information to contact [facility], and message has been sent to GI APP)   4.  Vitamin D  deficiency - Prior vitamin D  (07/31/2021) low at 18.95 - Most recent vitamin D  (01/05/2022): Improved and normal at 35.06 - Patient does not think he is taking his vitamin D  50,000 units weekly anymore - Vitamin D  (07/12/2023) normal at 35.38 - PLAN: No indication to restart Vitamin D  at  this time.  Recheck vitamin D  in 6 months (December 2025)  PLAN SUMMARY:  >> Venofer  400 mg x 1 (today) + 1 L NS >> Same-day labs (CBC/D, CMP, ferritin, iron /TIBC) + OFFICE visit in 6 weeks     REVIEW OF SYSTEMS:   Review of Systems  Constitutional:  Positive for fatigue. Negative for appetite change, chills, diaphoresis, fever and unexpected weight change.  HENT:   Negative for lump/mass and nosebleeds.   Eyes:  Negative for eye problems.  Respiratory:  Positive for cough and shortness of breath. Negative for hemoptysis.   Cardiovascular:  Negative for chest pain, leg swelling and palpitations.  Gastrointestinal:  Negative for abdominal pain, blood in stool, constipation, diarrhea, nausea and vomiting.  Genitourinary:  Negative for hematuria.   Skin: Negative.   Neurological:  Positive for dizziness and headaches. Negative for light-headedness.  Hematological:  Does not bruise/bleed easily.    PHYSICAL EXAM:  ECOG PERFORMANCE STATUS: 2 - Symptomatic, <50% confined to bed  Vitals:   07/19/23 0930 07/19/23 0945  BP: (!) 66/46 92/60  Pulse: 63   Resp: 18   Temp: 98.2 F (36.8 C)   SpO2: 100%    Filed Weights   07/19/23 0944  Weight: 128 lb 1.6 oz (58.1 kg)   Physical Exam  PAST MEDICAL/SURGICAL HISTORY:  Past Medical History:  Diagnosis Date   Aortic atherosclerosis (HCC) 08/03/2021   Atrial fibrillation (HCC)    CAD (coronary artery disease) 08/03/2021   Cardiac catheterization September 2023 with RCA/RV marginal stenosis managed medically   Cardiomyopathy (HCC)    Carotid artery disease (HCC)    CKD (chronic kidney disease) 06/28/2023   CKD (chronic kidney disease) stage 3, GFR 30-59 ml/min (HCC)    COPD (chronic obstructive pulmonary disease) (HCC)    Essential hypertension    Head and neck cancer (HCC) 2019   Right facial basal cell carcinoma s/p resection with right partial mastectomy and partal rhinectomy with skin graft (06/13/17)   Iron  deficiency anemia     Mass of colon 08/06/2021   Formatting of this note might be different from the original. Last Assessment & Plan:  Formatting of this note might be different from the original.  Status post right hemicolectomy by Dr. Collene Dawson today 7/12.    -Will check postop CBC and BMP     Urinary retention    Past Surgical History:  Procedure Laterality Date   ANKLE CLOSED REDUCTION Right    open reduction   BASAL CELL CARCINOMA EXCISION  2019   at [facility]   BIOPSY  07/19/2021   Procedure: BIOPSY;  Surgeon: Vinetta Greening, DO;  [facility];  Service: Endoscopy;;   CATARACT EXTRACTION W/PHACO Left 01/23/2023   Procedure: CATARACT EXTRACTION PHACO AND INTRAOCULAR LENS PLACEMENT (IOC);  Surgeon: Tarri Farm, MD;  Location: [facility];  Service: Ophthalmology;  Laterality: Left;  CDE: 23.26   COLONOSCOPY N/A 05/31/2017   Procedure: COLONOSCOPY;  Surgeon: Suzette Espy, MD;  Location: [facility];  Service: Endoscopy;  Laterality: N/A;  2:45pm   COLONOSCOPY WITH PROPOFOL  N/A 07/19/2021   Procedure: COLONOSCOPY WITH PROPOFOL ;  Surgeon: Vinetta Greening, DO;  Location: [facility];  Service: Endoscopy;  Laterality: N/A;  3:00pm, moved up to 9:00   CYSTOSCOPY N/A 10/29/2018   Procedure: CYSTOSCOPY, CLOT EVACUATION;  Surgeon: Adelbert Homans, MD;  Location: [facility];  Service: Urology;  Laterality: N/A;   PARTIAL COLECTOMY Right 08/11/2021   Procedure: PARTIAL COLECTOMY, OPEN RIGHT HEMICOLECTOMY;  Surgeon: Awilda Bogus, MD;  Location: [facility];  Service: General;  Laterality: Right;   POLYPECTOMY  05/31/2017   Procedure: POLYPECTOMY;  Surgeon: Suzette Espy, MD;  [facility];  Service: Endoscopy;;   RIGHT/LEFT HEART CATH AND CORONARY ANGIOGRAPHY N/A 10/22/2021   Procedure: RIGHT/LEFT HEART CATH AND CORONARY ANGIOGRAPHY;  Surgeon: Kyra Phy, MD;  [facility]: [facility];  Service: Cardiovascular;  Laterality: N/A;   TONSILLECTOMY     TRANSCAROTID ARTERY  REVASCULARIZATION  Right 02/15/2021   Procedure: RIGHT TRANSCAROTID ARTERY REVASCULARIZATION;  Surgeon: Young Hensen, MD;  Location: [facility] OR;  Service: Vascular;  Laterality: Right;   ULTRASOUND GUIDANCE FOR VASCULAR ACCESS Left 02/15/2021   Procedure: ULTRASOUND GUIDANCE FOR VASCULAR ACCESS;  Surgeon: Young Hensen, MD;  Location: [facility] OR;  Service: Vascular;  Laterality: Left;   XI ROBOTIC ASSISTED SIMPLE PROSTATECTOMY N/A 10/29/2018   Procedure: XI ROBOTIC ASSISTED SIMPLE PROSTATECTOMY;  Surgeon: Marco Severs, MD;  Location: [facility];  Service: Urology;  Laterality: N/A;    SOCIAL HISTORY:  Social History   Socioeconomic History   Marital status: Single    Spouse name: Not on file   Number of children: Not on file   Years of education: 12   Highest education level: 12th grade  Occupational History   Not on file  Tobacco Use   Smoking status: Former    Current packs/day: 0.00    Average packs/day: 0.3 packs/day for 40.0 years (10.0 ttl pk-yrs)    Types: Cigarettes    Start date: 07/02/1981    Quit date: 07/02/2021    Years since quitting: 2.0    Passive exposure: Past   Smokeless tobacco: Never   Tobacco comments:    Quit on 06/09/21  Vaping Use   Vaping status: Never Used  Substance and Sexual Activity   Alcohol use: Not Currently   Drug use: Never   Sexual activity: Not Currently  Other Topics Concern   Not on file  Social History Narrative   Not on file   Social Drivers of Health   Financial Resource Strain: Low Risk  (12/26/2022)   Overall Financial Resource Strain (CARDIA)    Difficulty of Paying Living Expenses: Not hard at all  Food Insecurity: No Food Insecurity (07/04/2023)   Hunger Vital Sign    Worried About Running Out of Food in the Last Year: Never true    Ran Out of Food in the Last Year: Never true  Transportation Needs: No Transportation Needs (07/04/2023)   PRAPARE - Administrator, Civil Service (Medical): No    Lack of  Transportation (Non-Medical): No  Physical Activity: Sufficiently Active (12/26/2022)   Exercise Vital Sign    Days of Exercise per Week: 5 days    Minutes of Exercise per Session: 50 min  Stress:  No Stress Concern Present (12/26/2022)   [employer] of Occupational Health - Occupational Stress Questionnaire    Feeling of Stress : Only a little  Social Connections: Moderately Isolated (06/28/2023)   Social Connection and Isolation Panel    Frequency of Communication with Friends and Family: More than three times a week    Frequency of Social Gatherings with Friends and Family: More than three times a week    Attends Religious Services: Never    Database administrator or Organizations: Yes    Attends Engineer, structural: More than 4 times per year    Marital Status: Divorced  Intimate Partner Violence: Not At Risk (07/04/2023)   Humiliation, Afraid, Rape, and Kick questionnaire    Fear of Current or Ex-Partner: No    Emotionally Abused: No    Physically Abused: No    Sexually Abused: No    FAMILY HISTORY:  Family History  Problem Relation Age of Onset   Stroke Father    Cirrhosis Mother    Colon cancer Neg Hx     CURRENT MEDICATIONS:  Outpatient Encounter Medications as of 07/19/2023  Medication Sig   apixaban  (ELIQUIS ) 5 MG TABS tablet Take 1 tablet (5 mg total) by mouth 2 (two) times daily.   atorvastatin  (LIPITOR ) 40 MG tablet Take 1 tablet (40 mg total) by mouth every evening. (Patient taking differently: Take 40 mg by mouth daily.)   budesonide -glycopyrrolate -formoterol  (BREZTRI  AEROSPHERE) 160-9-4.8 MCG/ACT AERO inhaler Inhale 2 puffs into the lungs 2 (two) times daily.   carvedilol  (COREG ) 25 MG tablet Take 1 tablet (25 mg total) by mouth 2 (two) times daily with a meal.   dicyclomine  (BENTYL ) 20 MG tablet Take 1 tablet (20 mg total) by mouth 2 (two) times daily.   losartan  (COZAAR ) 25 MG tablet Take 1 tablet (25 mg total) by mouth daily.   nicotine  (NICODERM  CQ - DOSED IN MG/24 HOURS) 21 mg/24hr patch Place 1 patch (21 mg total) onto the skin daily.   nitroGLYCERIN  (NITROSTAT ) 0.4 MG SL tablet Place 1 tablet (0.4 mg total) under the tongue every 5 (five) minutes as needed for chest pain.   potassium chloride  SA (KLOR-CON  M) 20 MEQ tablet Take 1 tablet (20 mEq total) by mouth 2 (two) times daily. Take Potassium Tablet while Taking Demadex /Torsemide    torsemide  (DEMADEX ) 20 MG tablet Take 1 tablet (20 mg total) by mouth See admin instructions. Take 2 Tabs (40 mg) q am and 1 tab (20 mg) QPM--   Vericiguat  (VERQUVO ) 5 MG TABS Take 1 tablet (5 mg total) by mouth daily.   No facility-administered encounter medications on file as of 07/19/2023.    ALLERGIES:  Allergies  Allergen Reactions   Entresto  [Sacubitril -Valsartan ] Other (See Comments)    Orthostatic Hypotension Per pt, he has no allergies   Sglt2 Inhibitors Other (See Comments)    UTI's Per pt, he has no allergies    LABORATORY DATA:  I have reviewed the labs as listed.  CBC    Component Value Date/Time   WBC 7.1 07/19/2023 0807   RBC 3.54 (L) 07/19/2023 0808   RBC 3.57 (L) 07/19/2023 0807   HGB 9.7 (L) 07/19/2023 0807   HGB 13.8 04/23/2011 0919   HCT 32.0 (L) 07/19/2023 0807   HCT 26.0 (L) 05/09/2018 0455   PLT 210 07/19/2023 0807   PLT 208 04/23/2011 0919   MCV 89.6 07/19/2023 0807   MCV 84 04/23/2011 0919   MCH 27.2 07/19/2023 9562  MCHC 30.3 07/19/2023 0807   RDW 18.7 (H) 07/19/2023 0807   RDW 12.3 04/23/2011 0919   LYMPHSABS 0.9 07/19/2023 0807   LYMPHSABS 1.1 04/23/2011 0919   MONOABS 0.7 07/19/2023 0807   MONOABS 0.9 (H) 04/23/2011 0919   EOSABS 0.1 07/19/2023 0807   EOSABS 0.0 04/23/2011 0919   BASOSABS 0.0 07/19/2023 0807   BASOSABS 0.0 04/23/2011 0919      Latest Ref Rng & Units 07/12/2023    2:20 PM 07/12/2023   12:39 PM 07/08/2023    7:01 PM  CMP  Glucose 70 - 99 mg/dL 409  811  914   BUN 8 - 23 mg/dL 50  49  57   Creatinine 0.61 - 1.24 mg/dL 7.82  9.56   2.13   Sodium 135 - 145 mmol/L 140  139  140   Potassium 3.5 - 5.1 mmol/L 3.6  3.4  3.4   Chloride 98 - 111 mmol/L 101  101  98   CO2 22 - 32 mmol/L 29  28  34   Calcium  8.9 - 10.3 mg/dL 8.5  8.3  8.1   Total Protein 6.5 - 8.1 g/dL 5.7  5.3    Total Bilirubin 0.0 - 1.2 mg/dL 1.1  1.1    Alkaline Phos 38 - 126 U/L 115  110    AST 15 - 41 U/L 25  24    ALT 0 - 44 U/L 26  26      DIAGNOSTIC IMAGING:  I have independently reviewed the relevant imaging and discussed with the patient.   WRAP UP:  All questions were answered. The patient knows to call the clinic with any problems, questions or concerns.  Medical decision making: Moderate  Time spent on visit: I spent 20 minutes counseling the patient face to face. The total time spent in the appointment was 30 minutes and more than 50% was on counseling.  Sonnie Dusky, PA-C  07/19/23 11:51 AM

## 2023-07-19 ENCOUNTER — Inpatient Hospital Stay (HOSPITAL_BASED_OUTPATIENT_CLINIC_OR_DEPARTMENT_OTHER): Admitting: Physician Assistant

## 2023-07-19 ENCOUNTER — Inpatient Hospital Stay

## 2023-07-19 VITALS — BP 92/60 | HR 63 | Temp 98.2°F | Resp 18 | Wt 128.1 lb

## 2023-07-19 VITALS — BP 135/88 | HR 66 | Temp 97.5°F | Resp 18

## 2023-07-19 DIAGNOSIS — D649 Anemia, unspecified: Secondary | ICD-10-CM

## 2023-07-19 DIAGNOSIS — I5023 Acute on chronic systolic (congestive) heart failure: Secondary | ICD-10-CM | POA: Diagnosis not present

## 2023-07-19 DIAGNOSIS — D500 Iron deficiency anemia secondary to blood loss (chronic): Secondary | ICD-10-CM

## 2023-07-19 DIAGNOSIS — D631 Anemia in chronic kidney disease: Secondary | ICD-10-CM

## 2023-07-19 DIAGNOSIS — K8020 Calculus of gallbladder without cholecystitis without obstruction: Secondary | ICD-10-CM | POA: Diagnosis not present

## 2023-07-19 DIAGNOSIS — Z9181 History of falling: Secondary | ICD-10-CM | POA: Diagnosis not present

## 2023-07-19 DIAGNOSIS — E440 Moderate protein-calorie malnutrition: Secondary | ICD-10-CM | POA: Diagnosis not present

## 2023-07-19 DIAGNOSIS — I130 Hypertensive heart and chronic kidney disease with heart failure and stage 1 through stage 4 chronic kidney disease, or unspecified chronic kidney disease: Secondary | ICD-10-CM | POA: Diagnosis not present

## 2023-07-19 DIAGNOSIS — I959 Hypotension, unspecified: Secondary | ICD-10-CM

## 2023-07-19 DIAGNOSIS — I160 Hypertensive urgency: Secondary | ICD-10-CM | POA: Diagnosis not present

## 2023-07-19 DIAGNOSIS — J441 Chronic obstructive pulmonary disease with (acute) exacerbation: Secondary | ICD-10-CM | POA: Diagnosis not present

## 2023-07-19 DIAGNOSIS — Z85818 Personal history of malignant neoplasm of other sites of lip, oral cavity, and pharynx: Secondary | ICD-10-CM | POA: Diagnosis not present

## 2023-07-19 DIAGNOSIS — E559 Vitamin D deficiency, unspecified: Secondary | ICD-10-CM | POA: Diagnosis not present

## 2023-07-19 DIAGNOSIS — N1832 Chronic kidney disease, stage 3b: Secondary | ICD-10-CM | POA: Diagnosis not present

## 2023-07-19 DIAGNOSIS — I440 Atrioventricular block, first degree: Secondary | ICD-10-CM | POA: Diagnosis not present

## 2023-07-19 DIAGNOSIS — I4891 Unspecified atrial fibrillation: Secondary | ICD-10-CM | POA: Diagnosis not present

## 2023-07-19 DIAGNOSIS — D509 Iron deficiency anemia, unspecified: Secondary | ICD-10-CM | POA: Diagnosis not present

## 2023-07-19 LAB — CBC WITH DIFFERENTIAL/PLATELET
Abs Immature Granulocytes: 0.02 10*3/uL (ref 0.00–0.07)
Basophils Absolute: 0 10*3/uL (ref 0.0–0.1)
Basophils Relative: 0 %
Eosinophils Absolute: 0.1 10*3/uL (ref 0.0–0.5)
Eosinophils Relative: 2 %
HCT: 32 % — ABNORMAL LOW (ref 39.0–52.0)
Hemoglobin: 9.7 g/dL — ABNORMAL LOW (ref 13.0–17.0)
Immature Granulocytes: 0 %
Lymphocytes Relative: 13 %
Lymphs Abs: 0.9 10*3/uL (ref 0.7–4.0)
MCH: 27.2 pg (ref 26.0–34.0)
MCHC: 30.3 g/dL (ref 30.0–36.0)
MCV: 89.6 fL (ref 80.0–100.0)
Monocytes Absolute: 0.7 10*3/uL (ref 0.1–1.0)
Monocytes Relative: 9 %
Neutro Abs: 5.3 10*3/uL (ref 1.7–7.7)
Neutrophils Relative %: 76 %
Platelets: 210 10*3/uL (ref 150–400)
RBC: 3.57 MIL/uL — ABNORMAL LOW (ref 4.22–5.81)
RDW: 18.7 % — ABNORMAL HIGH (ref 11.5–15.5)
WBC: 7.1 10*3/uL (ref 4.0–10.5)
nRBC: 0 % (ref 0.0–0.2)

## 2023-07-19 LAB — LACTATE DEHYDROGENASE: LDH: 183 U/L (ref 98–192)

## 2023-07-19 LAB — RETICULOCYTES
Immature Retic Fract: 10.4 % (ref 2.3–15.9)
RBC.: 3.54 MIL/uL — ABNORMAL LOW (ref 4.22–5.81)
Retic Count, Absolute: 71.9 10*3/uL (ref 19.0–186.0)
Retic Ct Pct: 2 % (ref 0.4–3.1)

## 2023-07-19 LAB — VITAMIN B12: Vitamin B-12: 450 pg/mL (ref 180–914)

## 2023-07-19 LAB — FOLATE: Folate: 12.3 ng/mL (ref >5.9)

## 2023-07-19 MED ORDER — SODIUM CHLORIDE 0.9 % IV SOLN
INTRAVENOUS | Status: DC
Start: 2023-07-19 — End: 2023-07-19

## 2023-07-19 MED ORDER — SODIUM CHLORIDE 0.9 % IV SOLN
400.0000 mg | Freq: Once | INTRAVENOUS | Status: AC
  Administered 2023-07-19: 400 mg via INTRAVENOUS
  Filled 2023-07-19: qty 400

## 2023-07-19 MED ORDER — CETIRIZINE HCL 10 MG PO TABS
10.0000 mg | ORAL_TABLET | Freq: Once | ORAL | Status: AC
  Administered 2023-07-19: 10 mg via ORAL
  Filled 2023-07-19: qty 1

## 2023-07-19 MED ORDER — ACETAMINOPHEN 325 MG PO TABS
650.0000 mg | ORAL_TABLET | Freq: Once | ORAL | Status: AC
  Administered 2023-07-19: 650 mg via ORAL
  Filled 2023-07-19: qty 2

## 2023-07-19 MED ORDER — SODIUM CHLORIDE 0.9 % IV SOLN: INTRAVENOUS | Status: DC

## 2023-07-19 NOTE — Progress Notes (Signed)
 Patient tolerated iron infusion with no complaints voiced.  Peripheral IV site clean and dry with good blood return noted before and after infusion.  Band aid applied.  VSS with discharge and left in satisfactory condition with no s/s of distress noted.

## 2023-07-19 NOTE — Patient Instructions (Signed)

## 2023-07-19 NOTE — Patient Instructions (Addendum)
 [facility] at [facility] Discharge Instructions  You were seen today by Sheril Dines PA-C for your follow-up visit.  ANEMIA: Your iron  levels are slightly low.  Will schedule you for IV iron  x 1 dose (given today in clinic). We check some additional labs today to see why your blood counts are staying low. We will see you again for follow-up in 6 weeks.  If your blood counts are still low despite normal iron , we may need to start you on other treatment (such as injections) to improve your blood count.  Your BLOOD PRESSURE WAS VERY LOW at your visit today.   You were given IV fluids due to low blood pressure. Make sure that you are drinking at least 64 ounces of water  daily. If you continue to have low blood pressure (<100/60), please contact your primary care doctor for further instructions. Seek immediate medical attention if you experience any chest pain, difficulty breathing, headache, dizziness, blurry vision, or strokelike symptoms.  FOLLOW-UP APPOINTMENT: Office visit in 6 weeks   Thank you for choosing [facility] at [facility] to provide your oncology and hematology care.  To afford each patient quality time with our provider, please arrive at least 15 minutes before your scheduled appointment time.   If you have a lab appointment with the Cancer Center please come in thru the Main Entrance and check in at the main information desk.  You need to re-schedule your appointment should you arrive 10 or more minutes late.  We strive to give you quality time with our providers, and arriving late affects you and other patients whose appointments are after yours.  Also, if you no show three or more times for appointments you may be dismissed from the clinic at the providers discretion.     Again, thank you for choosing [facility]  Our hope is that these requests will decrease the amount of time that you wait before being seen  by our physicians.       _____________________________________________________________  Should you have questions after your visit to [facility], please contact our office at [phone] and follow the prompts.  Our office hours are 8:00 a.m. and 4:30 p.m. Monday - Friday.  Please note that voicemails left after 4:00 p.m. may not be returned until the following business day.  We are closed weekends and major holidays.  You do have access to a nurse 24-7, just call the main number to the clinic [phone] and do not press any options, hold on the line and a nurse will answer the phone.    For prescription refill requests, have your pharmacy contact our office and allow 72 hours.    Due to Covid, you will need to wear a mask upon entering the hospital. If you do not have a mask, a mask will be given to you at the Main Entrance upon arrival. For doctor visits, patients may have 1 support person age 29 or older with them. For treatment visits, patients can not have anyone with them due to social distancing guidelines and our immunocompromised population.

## 2023-07-20 DIAGNOSIS — Z9181 History of falling: Secondary | ICD-10-CM | POA: Diagnosis not present

## 2023-07-20 DIAGNOSIS — I4891 Unspecified atrial fibrillation: Secondary | ICD-10-CM | POA: Diagnosis not present

## 2023-07-20 DIAGNOSIS — I160 Hypertensive urgency: Secondary | ICD-10-CM | POA: Diagnosis not present

## 2023-07-20 DIAGNOSIS — I5023 Acute on chronic systolic (congestive) heart failure: Secondary | ICD-10-CM | POA: Diagnosis not present

## 2023-07-20 DIAGNOSIS — I130 Hypertensive heart and chronic kidney disease with heart failure and stage 1 through stage 4 chronic kidney disease, or unspecified chronic kidney disease: Secondary | ICD-10-CM | POA: Diagnosis not present

## 2023-07-20 DIAGNOSIS — N1832 Chronic kidney disease, stage 3b: Secondary | ICD-10-CM | POA: Diagnosis not present

## 2023-07-20 DIAGNOSIS — R0689 Other abnormalities of breathing: Secondary | ICD-10-CM | POA: Diagnosis not present

## 2023-07-20 DIAGNOSIS — I440 Atrioventricular block, first degree: Secondary | ICD-10-CM | POA: Diagnosis not present

## 2023-07-20 DIAGNOSIS — J441 Chronic obstructive pulmonary disease with (acute) exacerbation: Secondary | ICD-10-CM | POA: Diagnosis not present

## 2023-07-20 DIAGNOSIS — E440 Moderate protein-calorie malnutrition: Secondary | ICD-10-CM | POA: Diagnosis not present

## 2023-07-20 DIAGNOSIS — I469 Cardiac arrest, cause unspecified: Secondary | ICD-10-CM | POA: Diagnosis not present

## 2023-07-20 DIAGNOSIS — K8020 Calculus of gallbladder without cholecystitis without obstruction: Secondary | ICD-10-CM | POA: Diagnosis not present

## 2023-07-20 DIAGNOSIS — I499 Cardiac arrhythmia, unspecified: Secondary | ICD-10-CM | POA: Diagnosis not present

## 2023-07-20 DIAGNOSIS — R404 Transient alteration of awareness: Secondary | ICD-10-CM | POA: Diagnosis not present

## 2023-07-20 DIAGNOSIS — E559 Vitamin D deficiency, unspecified: Secondary | ICD-10-CM | POA: Diagnosis not present

## 2023-07-20 DIAGNOSIS — Z85818 Personal history of malignant neoplasm of other sites of lip, oral cavity, and pharynx: Secondary | ICD-10-CM | POA: Diagnosis not present

## 2023-07-20 LAB — PROTEIN ELECTROPHORESIS, SERUM
A/G Ratio: 1 (ref 0.7–1.7)
Albumin ELP: 3.1 g/dL (ref 2.9–4.4)
Alpha-1-Globulin: 0.3 g/dL (ref 0.0–0.4)
Alpha-2-Globulin: 0.6 g/dL (ref 0.4–1.0)
Beta Globulin: 0.9 g/dL (ref 0.7–1.3)
Gamma Globulin: 1.2 g/dL (ref 0.4–1.8)
Globulin, Total: 3 g/dL (ref 2.2–3.9)
Total Protein ELP: 6.1 g/dL (ref 6.0–8.5)

## 2023-07-20 LAB — KAPPA/LAMBDA LIGHT CHAINS
Kappa free light chain: 84.3 mg/L — ABNORMAL HIGH (ref 3.3–19.4)
Kappa, lambda light chain ratio: 1.68 — ABNORMAL HIGH (ref 0.26–1.65)
Lambda free light chains: 50.2 mg/L — ABNORMAL HIGH (ref 5.7–26.3)

## 2023-07-21 DIAGNOSIS — I160 Hypertensive urgency: Secondary | ICD-10-CM | POA: Diagnosis not present

## 2023-07-21 DIAGNOSIS — I440 Atrioventricular block, first degree: Secondary | ICD-10-CM | POA: Diagnosis not present

## 2023-07-21 DIAGNOSIS — K8020 Calculus of gallbladder without cholecystitis without obstruction: Secondary | ICD-10-CM | POA: Diagnosis not present

## 2023-07-21 DIAGNOSIS — N1832 Chronic kidney disease, stage 3b: Secondary | ICD-10-CM | POA: Diagnosis not present

## 2023-07-21 DIAGNOSIS — Z9181 History of falling: Secondary | ICD-10-CM | POA: Diagnosis not present

## 2023-07-21 DIAGNOSIS — E559 Vitamin D deficiency, unspecified: Secondary | ICD-10-CM | POA: Diagnosis not present

## 2023-07-21 DIAGNOSIS — I4891 Unspecified atrial fibrillation: Secondary | ICD-10-CM | POA: Diagnosis not present

## 2023-07-21 DIAGNOSIS — I130 Hypertensive heart and chronic kidney disease with heart failure and stage 1 through stage 4 chronic kidney disease, or unspecified chronic kidney disease: Secondary | ICD-10-CM | POA: Diagnosis not present

## 2023-07-21 DIAGNOSIS — I5023 Acute on chronic systolic (congestive) heart failure: Secondary | ICD-10-CM | POA: Diagnosis not present

## 2023-07-21 DIAGNOSIS — Z85818 Personal history of malignant neoplasm of other sites of lip, oral cavity, and pharynx: Secondary | ICD-10-CM | POA: Diagnosis not present

## 2023-07-21 DIAGNOSIS — J441 Chronic obstructive pulmonary disease with (acute) exacerbation: Secondary | ICD-10-CM | POA: Diagnosis not present

## 2023-07-21 LAB — COPPER, SERUM: Copper: 121 ug/dL (ref 69–132)

## 2023-07-22 LAB — METHYLMALONIC ACID, SERUM: Methylmalonic Acid, Quantitative: 618 nmol/L — ABNORMAL HIGH (ref 0–378)

## 2023-07-23 LAB — IMMUNOFIXATION ELECTROPHORESIS
IgA: 234 mg/dL (ref 61–437)
IgG (Immunoglobin G), Serum: 1091 mg/dL (ref 603–1613)
IgM (Immunoglobulin M), Srm: 334 mg/dL — ABNORMAL HIGH (ref 20–172)
Total Protein ELP: 5.8 g/dL — ABNORMAL LOW (ref 6.0–8.5)

## 2023-07-26 ENCOUNTER — Telehealth: Payer: Self-pay | Admitting: Physician Assistant

## 2023-07-26 DIAGNOSIS — E538 Deficiency of other specified B group vitamins: Secondary | ICD-10-CM

## 2023-07-26 MED ORDER — VITAMIN B-12 1000 MCG PO TABS
1000.0000 ug | ORAL_TABLET | Freq: Every day | ORAL | 3 refills | Status: DC
Start: 2023-07-26 — End: 2023-07-26

## 2023-07-26 NOTE — Telephone Encounter (Signed)
 Called [employer] and spoke with Burnard.  I was advised that Michael [name] was no longer on their census. They could not tell me anything else.  I called Michael [name] contact, Michael [name] and he informed me that Michael [name] had passed away.  I called back to [facility] to inquire with the social worker about this claim and she confirmed that he did die in their facility on 07/03/2023.  I have updated his records to reflect that date.

## 2023-07-26 NOTE — Telephone Encounter (Signed)
 Reviewed additional labs from 07/19/2023. Patient has evidence of vitamin B12 deficiency, with MMA elevated at 618 (B12 450). Rx sent to pharmacy for vitamin B12 1000 mcg daily. We will recheck B12/MMA at follow-up visit.  Pleasant CHRISTELLA Barefoot, PA-C 07/26/23 11:17 AM

## 2023-08-01 DEATH — deceased

## 2023-08-28 NOTE — Progress Notes (Signed)
 Erroneous encounter

## 2023-08-31 ENCOUNTER — Other Ambulatory Visit (HOSPITAL_BASED_OUTPATIENT_CLINIC_OR_DEPARTMENT_OTHER): Payer: Self-pay

## 2023-09-04 ENCOUNTER — Ambulatory Visit: Admitting: Physician Assistant

## 2023-09-04 ENCOUNTER — Other Ambulatory Visit

## 2023-09-19 ENCOUNTER — Ambulatory Visit: Admitting: Cardiology

## 2023-09-22 ENCOUNTER — Ambulatory Visit: Admitting: Cardiology
